# Patient Record
Sex: Female | Born: 1940
Health system: Southern US, Community
[De-identification: ages and names within clinical notes are randomized; demographics above are authoritative.]

## PROBLEM LIST (undated history)

## (undated) DIAGNOSIS — R339 Retention of urine, unspecified: Secondary | ICD-10-CM

## (undated) DIAGNOSIS — Z9889 Other specified postprocedural states: Secondary | ICD-10-CM

## (undated) DIAGNOSIS — E785 Hyperlipidemia, unspecified: Secondary | ICD-10-CM

## (undated) DIAGNOSIS — M069 Rheumatoid arthritis, unspecified: Secondary | ICD-10-CM

## (undated) DIAGNOSIS — S62101A Fracture of unspecified carpal bone, right wrist, initial encounter for closed fracture: Secondary | ICD-10-CM

## (undated) DIAGNOSIS — G709 Myoneural disorder, unspecified: Secondary | ICD-10-CM

## (undated) DIAGNOSIS — Z860100 Personal history of colon polyps, unspecified: Secondary | ICD-10-CM

## (undated) DIAGNOSIS — R238 Other skin changes: Secondary | ICD-10-CM

## (undated) DIAGNOSIS — M549 Dorsalgia, unspecified: Secondary | ICD-10-CM

## (undated) DIAGNOSIS — G35D Multiple sclerosis, unspecified: Secondary | ICD-10-CM

## (undated) DIAGNOSIS — I10 Essential (primary) hypertension: Secondary | ICD-10-CM

## (undated) DIAGNOSIS — R233 Spontaneous ecchymoses: Secondary | ICD-10-CM

## (undated) DIAGNOSIS — G35 Multiple sclerosis: Secondary | ICD-10-CM

## (undated) DIAGNOSIS — K219 Gastro-esophageal reflux disease without esophagitis: Secondary | ICD-10-CM

## (undated) DIAGNOSIS — M2550 Pain in unspecified joint: Secondary | ICD-10-CM

## (undated) DIAGNOSIS — M2540 Effusion, unspecified joint: Secondary | ICD-10-CM

## (undated) DIAGNOSIS — M1990 Unspecified osteoarthritis, unspecified site: Secondary | ICD-10-CM

## (undated) DIAGNOSIS — Z8709 Personal history of other diseases of the respiratory system: Secondary | ICD-10-CM

## (undated) DIAGNOSIS — G8929 Other chronic pain: Secondary | ICD-10-CM

## (undated) DIAGNOSIS — R42 Dizziness and giddiness: Secondary | ICD-10-CM

## (undated) DIAGNOSIS — R51 Headache: Secondary | ICD-10-CM

## (undated) DIAGNOSIS — C801 Malignant (primary) neoplasm, unspecified: Secondary | ICD-10-CM

## (undated) DIAGNOSIS — R351 Nocturia: Secondary | ICD-10-CM

## (undated) DIAGNOSIS — C4490 Unspecified malignant neoplasm of skin, unspecified: Secondary | ICD-10-CM

## (undated) DIAGNOSIS — H269 Unspecified cataract: Secondary | ICD-10-CM

## (undated) DIAGNOSIS — H9190 Unspecified hearing loss, unspecified ear: Secondary | ICD-10-CM

## (undated) DIAGNOSIS — F419 Anxiety disorder, unspecified: Secondary | ICD-10-CM

## (undated) DIAGNOSIS — R112 Nausea with vomiting, unspecified: Secondary | ICD-10-CM

## (undated) HISTORY — PX: CHEST TUBE INSERTION: SHX231

## (undated) HISTORY — DX: Unspecified malignant neoplasm of skin, unspecified: C44.90

## (undated) HISTORY — PX: EYE SURGERY: SHX253

## (undated) HISTORY — PX: TOTAL SHOULDER ARTHROPLASTY: SHX126

## (undated) HISTORY — PX: THYROID SURGERY: SHX805

## (undated) HISTORY — PX: OTHER SURGICAL HISTORY: SHX169

## (undated) HISTORY — DX: Rheumatoid arthritis, unspecified: M06.9

## (undated) HISTORY — PX: BREAST BIOPSY: SHX20

## (undated) HISTORY — DX: Essential (primary) hypertension: I10

## (undated) HISTORY — PX: LUMBAR FUSION: SHX111

## (undated) HISTORY — PX: TUBAL LIGATION: SHX77

## (undated) HISTORY — PX: ABDOMINAL HYSTERECTOMY: SHX81

## (undated) HISTORY — DX: Fracture of unspecified carpal bone, right wrist, initial encounter for closed fracture: S62.101A

## (undated) HISTORY — PX: JOINT REPLACEMENT: SHX530

## (undated) HISTORY — PX: SKIN BIOPSY: SHX1

## (undated) HISTORY — DX: Unspecified osteoarthritis, unspecified site: M19.90

## (undated) HISTORY — PX: NASAL SINUS SURGERY: SHX719

## (undated) HISTORY — PX: APPENDECTOMY: SHX54

## (undated) HISTORY — PX: CERVICAL FUSION: SHX112

## (undated) HISTORY — PX: BACK SURGERY: SHX140

## (undated) HISTORY — PX: CARPAL TUNNEL RELEASE: SHX101

---

## 1988-01-21 DIAGNOSIS — C4491 Basal cell carcinoma of skin, unspecified: Secondary | ICD-10-CM

## 1988-01-21 HISTORY — DX: Basal cell carcinoma of skin, unspecified: C44.91

## 1995-05-27 HISTORY — PX: COLONOSCOPY W/ POLYPECTOMY: SHX1380

## 1997-09-10 ENCOUNTER — Ambulatory Visit (HOSPITAL_COMMUNITY): Admission: RE | Admit: 1997-09-10 | Discharge: 1997-09-10 | Payer: Self-pay | Admitting: Neurosurgery

## 1997-09-18 ENCOUNTER — Encounter: Admission: RE | Admit: 1997-09-18 | Discharge: 1997-12-17 | Payer: Self-pay | Admitting: Neurosurgery

## 1998-03-24 ENCOUNTER — Encounter: Payer: Self-pay | Admitting: *Deleted

## 1998-03-24 ENCOUNTER — Emergency Department (HOSPITAL_COMMUNITY): Admission: EM | Admit: 1998-03-24 | Discharge: 1998-03-24 | Payer: Self-pay | Admitting: *Deleted

## 1998-03-25 ENCOUNTER — Emergency Department (HOSPITAL_COMMUNITY): Admission: EM | Admit: 1998-03-25 | Discharge: 1998-03-25 | Payer: Self-pay | Admitting: Emergency Medicine

## 1998-03-27 ENCOUNTER — Encounter: Payer: Self-pay | Admitting: Internal Medicine

## 1998-03-27 ENCOUNTER — Inpatient Hospital Stay (HOSPITAL_COMMUNITY): Admission: EM | Admit: 1998-03-27 | Discharge: 1998-04-02 | Payer: Self-pay | Admitting: Internal Medicine

## 1998-03-28 ENCOUNTER — Encounter: Payer: Self-pay | Admitting: Cardiothoracic Surgery

## 1998-03-29 ENCOUNTER — Encounter: Payer: Self-pay | Admitting: Cardiothoracic Surgery

## 1998-03-30 ENCOUNTER — Encounter: Payer: Self-pay | Admitting: Cardiothoracic Surgery

## 1998-04-01 ENCOUNTER — Encounter: Payer: Self-pay | Admitting: Thoracic Surgery

## 1998-04-01 ENCOUNTER — Encounter: Payer: Self-pay | Admitting: Cardiothoracic Surgery

## 1998-04-02 ENCOUNTER — Encounter: Payer: Self-pay | Admitting: Thoracic Surgery

## 1998-05-03 ENCOUNTER — Encounter: Payer: Self-pay | Admitting: Internal Medicine

## 1998-05-03 ENCOUNTER — Ambulatory Visit (HOSPITAL_COMMUNITY): Admission: RE | Admit: 1998-05-03 | Discharge: 1998-05-03 | Payer: Self-pay | Admitting: Internal Medicine

## 1999-05-24 ENCOUNTER — Encounter: Payer: Self-pay | Admitting: Internal Medicine

## 1999-05-24 ENCOUNTER — Ambulatory Visit (HOSPITAL_COMMUNITY): Admission: RE | Admit: 1999-05-24 | Discharge: 1999-05-24 | Payer: Self-pay | Admitting: Internal Medicine

## 1999-06-11 ENCOUNTER — Encounter: Admission: RE | Admit: 1999-06-11 | Discharge: 1999-06-11 | Payer: Self-pay | Admitting: Obstetrics and Gynecology

## 1999-06-11 ENCOUNTER — Encounter: Payer: Self-pay | Admitting: Obstetrics and Gynecology

## 1999-07-22 ENCOUNTER — Encounter: Admission: RE | Admit: 1999-07-22 | Discharge: 1999-07-22 | Payer: Self-pay | Admitting: Internal Medicine

## 1999-07-22 ENCOUNTER — Encounter: Payer: Self-pay | Admitting: Internal Medicine

## 2000-06-22 ENCOUNTER — Encounter: Payer: Self-pay | Admitting: Obstetrics and Gynecology

## 2000-06-22 ENCOUNTER — Encounter: Admission: RE | Admit: 2000-06-22 | Discharge: 2000-06-22 | Payer: Self-pay | Admitting: Obstetrics and Gynecology

## 2000-12-04 ENCOUNTER — Other Ambulatory Visit: Admission: RE | Admit: 2000-12-04 | Discharge: 2000-12-04 | Payer: Self-pay | Admitting: Obstetrics and Gynecology

## 2001-11-02 DIAGNOSIS — C4492 Squamous cell carcinoma of skin, unspecified: Secondary | ICD-10-CM

## 2001-11-02 HISTORY — DX: Squamous cell carcinoma of skin, unspecified: C44.92

## 2001-12-17 ENCOUNTER — Other Ambulatory Visit: Admission: RE | Admit: 2001-12-17 | Discharge: 2001-12-17 | Payer: Self-pay | Admitting: Obstetrics and Gynecology

## 2002-04-01 ENCOUNTER — Encounter: Payer: Self-pay | Admitting: Neurosurgery

## 2002-04-05 ENCOUNTER — Encounter: Payer: Self-pay | Admitting: Neurosurgery

## 2002-04-05 ENCOUNTER — Inpatient Hospital Stay (HOSPITAL_COMMUNITY): Admission: RE | Admit: 2002-04-05 | Discharge: 2002-04-10 | Payer: Self-pay | Admitting: Neurosurgery

## 2002-04-08 ENCOUNTER — Encounter: Payer: Self-pay | Admitting: Neurosurgery

## 2002-04-10 ENCOUNTER — Emergency Department (HOSPITAL_COMMUNITY): Admission: EM | Admit: 2002-04-10 | Discharge: 2002-04-10 | Payer: Self-pay | Admitting: Emergency Medicine

## 2002-04-13 ENCOUNTER — Ambulatory Visit (HOSPITAL_COMMUNITY): Admission: RE | Admit: 2002-04-13 | Discharge: 2002-04-13 | Payer: Self-pay | Admitting: Neurosurgery

## 2002-04-13 ENCOUNTER — Encounter: Payer: Self-pay | Admitting: Neurosurgery

## 2002-12-28 ENCOUNTER — Encounter: Admission: RE | Admit: 2002-12-28 | Discharge: 2002-12-28 | Payer: Self-pay | Admitting: Internal Medicine

## 2002-12-28 ENCOUNTER — Encounter: Payer: Self-pay | Admitting: Internal Medicine

## 2003-01-05 ENCOUNTER — Other Ambulatory Visit: Admission: RE | Admit: 2003-01-05 | Discharge: 2003-01-05 | Payer: Self-pay | Admitting: Obstetrics and Gynecology

## 2003-01-10 ENCOUNTER — Ambulatory Visit (HOSPITAL_COMMUNITY): Admission: RE | Admit: 2003-01-10 | Discharge: 2003-01-10 | Payer: Self-pay | Admitting: Neurosurgery

## 2003-02-10 ENCOUNTER — Encounter (INDEPENDENT_AMBULATORY_CARE_PROVIDER_SITE_OTHER): Payer: Self-pay | Admitting: *Deleted

## 2003-02-10 ENCOUNTER — Ambulatory Visit (HOSPITAL_COMMUNITY): Admission: RE | Admit: 2003-02-10 | Discharge: 2003-02-10 | Payer: Self-pay | Admitting: Internal Medicine

## 2003-02-13 ENCOUNTER — Encounter (INDEPENDENT_AMBULATORY_CARE_PROVIDER_SITE_OTHER): Payer: Self-pay | Admitting: Internal Medicine

## 2003-02-13 ENCOUNTER — Ambulatory Visit (HOSPITAL_COMMUNITY): Admission: RE | Admit: 2003-02-13 | Discharge: 2003-02-13 | Payer: Self-pay | Admitting: Internal Medicine

## 2003-06-06 ENCOUNTER — Inpatient Hospital Stay (HOSPITAL_COMMUNITY): Admission: EM | Admit: 2003-06-06 | Discharge: 2003-06-08 | Payer: Self-pay | Admitting: Emergency Medicine

## 2004-02-19 ENCOUNTER — Other Ambulatory Visit: Admission: RE | Admit: 2004-02-19 | Discharge: 2004-02-19 | Payer: Self-pay | Admitting: Obstetrics and Gynecology

## 2004-04-05 ENCOUNTER — Ambulatory Visit: Payer: Self-pay | Admitting: Internal Medicine

## 2004-06-11 ENCOUNTER — Ambulatory Visit: Payer: Self-pay | Admitting: Internal Medicine

## 2004-06-26 ENCOUNTER — Ambulatory Visit: Payer: Self-pay | Admitting: Internal Medicine

## 2004-07-18 ENCOUNTER — Ambulatory Visit: Payer: Self-pay | Admitting: Internal Medicine

## 2004-08-23 ENCOUNTER — Ambulatory Visit: Payer: Self-pay | Admitting: Internal Medicine

## 2004-12-31 ENCOUNTER — Ambulatory Visit: Payer: Self-pay | Admitting: Internal Medicine

## 2005-01-02 ENCOUNTER — Ambulatory Visit (HOSPITAL_COMMUNITY): Admission: RE | Admit: 2005-01-02 | Discharge: 2005-01-02 | Payer: Self-pay | Admitting: Orthopedic Surgery

## 2005-03-06 ENCOUNTER — Ambulatory Visit: Payer: Self-pay | Admitting: Internal Medicine

## 2005-04-25 ENCOUNTER — Ambulatory Visit: Payer: Self-pay | Admitting: Internal Medicine

## 2005-06-10 ENCOUNTER — Ambulatory Visit: Payer: Self-pay | Admitting: Internal Medicine

## 2005-06-30 ENCOUNTER — Other Ambulatory Visit: Admission: RE | Admit: 2005-06-30 | Discharge: 2005-06-30 | Payer: Self-pay | Admitting: Obstetrics and Gynecology

## 2005-07-07 ENCOUNTER — Ambulatory Visit (HOSPITAL_COMMUNITY): Admission: RE | Admit: 2005-07-07 | Discharge: 2005-07-07 | Payer: Self-pay | Admitting: Neurosurgery

## 2005-07-28 ENCOUNTER — Ambulatory Visit: Payer: Self-pay | Admitting: Internal Medicine

## 2005-07-30 ENCOUNTER — Ambulatory Visit: Payer: Self-pay | Admitting: Internal Medicine

## 2005-08-05 ENCOUNTER — Ambulatory Visit: Payer: Self-pay | Admitting: Internal Medicine

## 2005-08-08 ENCOUNTER — Ambulatory Visit: Payer: Self-pay | Admitting: Internal Medicine

## 2005-09-11 ENCOUNTER — Encounter (INDEPENDENT_AMBULATORY_CARE_PROVIDER_SITE_OTHER): Payer: Self-pay | Admitting: *Deleted

## 2005-09-11 ENCOUNTER — Ambulatory Visit: Payer: Self-pay | Admitting: Internal Medicine

## 2005-09-11 ENCOUNTER — Ambulatory Visit (HOSPITAL_COMMUNITY): Admission: RE | Admit: 2005-09-11 | Discharge: 2005-09-11 | Payer: Self-pay | Admitting: Internal Medicine

## 2005-09-24 ENCOUNTER — Encounter: Payer: Self-pay | Admitting: Cardiothoracic Surgery

## 2005-09-24 ENCOUNTER — Inpatient Hospital Stay (HOSPITAL_COMMUNITY): Admission: RE | Admit: 2005-09-24 | Discharge: 2005-09-26 | Payer: Self-pay | Admitting: Neurosurgery

## 2006-01-23 ENCOUNTER — Ambulatory Visit: Payer: Self-pay | Admitting: Internal Medicine

## 2006-02-25 ENCOUNTER — Encounter: Admission: RE | Admit: 2006-02-25 | Discharge: 2006-02-25 | Payer: Self-pay | Admitting: Obstetrics and Gynecology

## 2006-04-23 ENCOUNTER — Ambulatory Visit: Payer: Self-pay | Admitting: Internal Medicine

## 2006-08-20 DIAGNOSIS — Z8541 Personal history of malignant neoplasm of cervix uteri: Secondary | ICD-10-CM | POA: Insufficient documentation

## 2006-08-20 DIAGNOSIS — M069 Rheumatoid arthritis, unspecified: Secondary | ICD-10-CM | POA: Insufficient documentation

## 2006-08-20 DIAGNOSIS — F411 Generalized anxiety disorder: Secondary | ICD-10-CM | POA: Insufficient documentation

## 2006-08-20 DIAGNOSIS — I10 Essential (primary) hypertension: Secondary | ICD-10-CM | POA: Insufficient documentation

## 2006-08-20 DIAGNOSIS — Z8711 Personal history of peptic ulcer disease: Secondary | ICD-10-CM

## 2006-08-20 DIAGNOSIS — E785 Hyperlipidemia, unspecified: Secondary | ICD-10-CM | POA: Insufficient documentation

## 2006-08-20 DIAGNOSIS — M1990 Unspecified osteoarthritis, unspecified site: Secondary | ICD-10-CM | POA: Insufficient documentation

## 2006-08-20 DIAGNOSIS — M109 Gout, unspecified: Secondary | ICD-10-CM

## 2006-09-16 ENCOUNTER — Ambulatory Visit: Payer: Self-pay | Admitting: Internal Medicine

## 2006-09-22 ENCOUNTER — Ambulatory Visit: Payer: Self-pay | Admitting: Internal Medicine

## 2006-11-02 ENCOUNTER — Encounter: Payer: Self-pay | Admitting: Internal Medicine

## 2006-11-03 ENCOUNTER — Ambulatory Visit: Payer: Self-pay | Admitting: Internal Medicine

## 2006-11-16 ENCOUNTER — Encounter: Payer: Self-pay | Admitting: Internal Medicine

## 2007-02-05 ENCOUNTER — Ambulatory Visit (HOSPITAL_COMMUNITY)
Admission: RE | Admit: 2007-02-05 | Discharge: 2007-02-05 | Payer: Self-pay | Admitting: Physical Medicine and Rehabilitation

## 2007-02-05 ENCOUNTER — Ambulatory Visit: Payer: Self-pay | Admitting: Vascular Surgery

## 2007-02-05 ENCOUNTER — Encounter (INDEPENDENT_AMBULATORY_CARE_PROVIDER_SITE_OTHER): Payer: Self-pay | Admitting: Physical Medicine and Rehabilitation

## 2007-02-05 ENCOUNTER — Encounter: Payer: Self-pay | Admitting: Internal Medicine

## 2007-02-08 ENCOUNTER — Encounter
Admission: RE | Admit: 2007-02-08 | Discharge: 2007-02-08 | Payer: Self-pay | Admitting: Physical Medicine and Rehabilitation

## 2007-02-18 ENCOUNTER — Encounter: Admission: RE | Admit: 2007-02-18 | Discharge: 2007-02-18 | Payer: Self-pay | Admitting: Rheumatology

## 2007-02-19 ENCOUNTER — Telehealth (INDEPENDENT_AMBULATORY_CARE_PROVIDER_SITE_OTHER): Payer: Self-pay | Admitting: *Deleted

## 2007-02-22 ENCOUNTER — Ambulatory Visit: Payer: Self-pay | Admitting: Internal Medicine

## 2007-03-04 ENCOUNTER — Telehealth (INDEPENDENT_AMBULATORY_CARE_PROVIDER_SITE_OTHER): Payer: Self-pay | Admitting: *Deleted

## 2007-03-10 ENCOUNTER — Encounter: Payer: Self-pay | Admitting: Internal Medicine

## 2007-03-30 ENCOUNTER — Ambulatory Visit: Payer: Self-pay | Admitting: Internal Medicine

## 2007-03-30 DIAGNOSIS — R5382 Chronic fatigue, unspecified: Secondary | ICD-10-CM

## 2007-05-10 ENCOUNTER — Telehealth (INDEPENDENT_AMBULATORY_CARE_PROVIDER_SITE_OTHER): Payer: Self-pay | Admitting: *Deleted

## 2007-05-10 ENCOUNTER — Encounter: Payer: Self-pay | Admitting: Internal Medicine

## 2007-05-13 ENCOUNTER — Emergency Department (HOSPITAL_COMMUNITY): Admission: EM | Admit: 2007-05-13 | Discharge: 2007-05-13 | Payer: Self-pay | Admitting: Emergency Medicine

## 2007-05-13 ENCOUNTER — Telehealth: Payer: Self-pay | Admitting: Internal Medicine

## 2007-07-01 ENCOUNTER — Encounter: Payer: Self-pay | Admitting: Internal Medicine

## 2007-08-24 ENCOUNTER — Encounter: Payer: Self-pay | Admitting: Internal Medicine

## 2007-11-09 ENCOUNTER — Telehealth (INDEPENDENT_AMBULATORY_CARE_PROVIDER_SITE_OTHER): Payer: Self-pay | Admitting: *Deleted

## 2007-11-16 ENCOUNTER — Ambulatory Visit: Payer: Self-pay | Admitting: Internal Medicine

## 2007-11-29 ENCOUNTER — Encounter (INDEPENDENT_AMBULATORY_CARE_PROVIDER_SITE_OTHER): Payer: Self-pay | Admitting: *Deleted

## 2007-11-29 LAB — CONVERTED CEMR LAB
ALT: 19 units/L (ref 0–35)
Albumin: 3.7 g/dL (ref 3.5–5.2)
BUN: 12 mg/dL (ref 6–23)
Basophils Absolute: 0 10*3/uL (ref 0.0–0.1)
Basophils Relative: 0.8 % (ref 0.0–1.0)
Calcium: 9.3 mg/dL (ref 8.4–10.5)
Cholesterol: 176 mg/dL (ref 0–200)
Creatinine, Ser: 0.9 mg/dL (ref 0.4–1.2)
Eosinophils Absolute: 0.1 10*3/uL (ref 0.0–0.7)
Eosinophils Relative: 1.1 % (ref 0.0–5.0)
GFR calc non Af Amer: 67 mL/min
HCT: 39 % (ref 36.0–46.0)
Hemoglobin: 13.4 g/dL (ref 12.0–15.0)
MCHC: 34.3 g/dL (ref 30.0–36.0)
MCV: 97.8 fL (ref 78.0–100.0)
Monocytes Absolute: 0.6 10*3/uL (ref 0.1–1.0)
Neutro Abs: 3.4 10*3/uL (ref 1.4–7.7)
RBC: 3.98 M/uL (ref 3.87–5.11)
TSH: 1.73 microintl units/mL (ref 0.35–5.50)
Total Bilirubin: 0.5 mg/dL (ref 0.3–1.2)
VLDL: 20 mg/dL (ref 0–40)

## 2007-12-22 ENCOUNTER — Encounter: Payer: Self-pay | Admitting: Internal Medicine

## 2008-01-03 ENCOUNTER — Telehealth (INDEPENDENT_AMBULATORY_CARE_PROVIDER_SITE_OTHER): Payer: Self-pay | Admitting: *Deleted

## 2008-01-18 ENCOUNTER — Encounter: Payer: Self-pay | Admitting: Internal Medicine

## 2008-04-06 ENCOUNTER — Encounter: Payer: Self-pay | Admitting: Internal Medicine

## 2008-05-01 ENCOUNTER — Encounter: Payer: Self-pay | Admitting: Internal Medicine

## 2008-05-02 ENCOUNTER — Encounter (INDEPENDENT_AMBULATORY_CARE_PROVIDER_SITE_OTHER): Payer: Self-pay | Admitting: *Deleted

## 2008-05-17 ENCOUNTER — Ambulatory Visit: Payer: Self-pay | Admitting: Internal Medicine

## 2008-05-17 DIAGNOSIS — R7309 Other abnormal glucose: Secondary | ICD-10-CM

## 2008-05-17 DIAGNOSIS — E041 Nontoxic single thyroid nodule: Secondary | ICD-10-CM | POA: Insufficient documentation

## 2008-05-17 DIAGNOSIS — G35 Multiple sclerosis: Secondary | ICD-10-CM | POA: Insufficient documentation

## 2008-05-22 ENCOUNTER — Encounter (INDEPENDENT_AMBULATORY_CARE_PROVIDER_SITE_OTHER): Payer: Self-pay | Admitting: *Deleted

## 2008-11-14 ENCOUNTER — Ambulatory Visit: Payer: Self-pay | Admitting: Internal Medicine

## 2008-11-14 DIAGNOSIS — I951 Orthostatic hypotension: Secondary | ICD-10-CM | POA: Insufficient documentation

## 2008-11-14 LAB — CONVERTED CEMR LAB
Cholesterol, target level: 200 mg/dL
HDL goal, serum: 40 mg/dL
LDL Goal: 160 mg/dL

## 2009-01-22 ENCOUNTER — Ambulatory Visit: Payer: Self-pay | Admitting: Internal Medicine

## 2009-01-23 ENCOUNTER — Ambulatory Visit: Payer: Self-pay | Admitting: Internal Medicine

## 2009-01-25 ENCOUNTER — Telehealth (INDEPENDENT_AMBULATORY_CARE_PROVIDER_SITE_OTHER): Payer: Self-pay | Admitting: *Deleted

## 2009-01-25 ENCOUNTER — Encounter (INDEPENDENT_AMBULATORY_CARE_PROVIDER_SITE_OTHER): Payer: Self-pay | Admitting: *Deleted

## 2009-03-01 ENCOUNTER — Encounter: Payer: Self-pay | Admitting: Internal Medicine

## 2009-05-21 ENCOUNTER — Telehealth (INDEPENDENT_AMBULATORY_CARE_PROVIDER_SITE_OTHER): Payer: Self-pay | Admitting: *Deleted

## 2009-07-26 ENCOUNTER — Encounter: Payer: Self-pay | Admitting: Internal Medicine

## 2009-08-20 ENCOUNTER — Telehealth: Payer: Self-pay | Admitting: Internal Medicine

## 2009-10-11 ENCOUNTER — Telehealth (INDEPENDENT_AMBULATORY_CARE_PROVIDER_SITE_OTHER): Payer: Self-pay | Admitting: *Deleted

## 2009-11-12 ENCOUNTER — Telehealth (INDEPENDENT_AMBULATORY_CARE_PROVIDER_SITE_OTHER): Payer: Self-pay | Admitting: *Deleted

## 2009-11-29 ENCOUNTER — Telehealth: Payer: Self-pay | Admitting: Internal Medicine

## 2009-12-11 ENCOUNTER — Encounter: Payer: Self-pay | Admitting: Internal Medicine

## 2010-02-22 ENCOUNTER — Encounter: Payer: Self-pay | Admitting: Internal Medicine

## 2010-04-15 ENCOUNTER — Encounter: Payer: Self-pay | Admitting: Internal Medicine

## 2010-04-24 ENCOUNTER — Encounter: Payer: Self-pay | Admitting: Internal Medicine

## 2010-05-06 ENCOUNTER — Encounter: Payer: Self-pay | Admitting: Internal Medicine

## 2010-05-17 ENCOUNTER — Emergency Department (HOSPITAL_BASED_OUTPATIENT_CLINIC_OR_DEPARTMENT_OTHER)
Admission: EM | Admit: 2010-05-17 | Discharge: 2010-05-17 | Payer: Self-pay | Source: Home / Self Care | Admitting: Emergency Medicine

## 2010-05-26 HISTORY — PX: UPPER GASTROINTESTINAL ENDOSCOPY: SHX188

## 2010-05-28 ENCOUNTER — Encounter (INDEPENDENT_AMBULATORY_CARE_PROVIDER_SITE_OTHER): Payer: Self-pay | Admitting: *Deleted

## 2010-05-28 ENCOUNTER — Encounter: Payer: Self-pay | Admitting: Internal Medicine

## 2010-05-28 ENCOUNTER — Ambulatory Visit
Admission: RE | Admit: 2010-05-28 | Discharge: 2010-05-28 | Payer: Self-pay | Source: Home / Self Care | Attending: Internal Medicine | Admitting: Internal Medicine

## 2010-05-28 DIAGNOSIS — R351 Nocturia: Secondary | ICD-10-CM | POA: Insufficient documentation

## 2010-05-29 ENCOUNTER — Other Ambulatory Visit: Payer: Self-pay | Admitting: Internal Medicine

## 2010-05-29 ENCOUNTER — Ambulatory Visit
Admission: RE | Admit: 2010-05-29 | Discharge: 2010-05-29 | Payer: Self-pay | Source: Home / Self Care | Attending: Internal Medicine | Admitting: Internal Medicine

## 2010-05-29 ENCOUNTER — Encounter: Payer: Self-pay | Admitting: Internal Medicine

## 2010-05-29 LAB — CONVERTED CEMR LAB
Nitrite: POSITIVE
Protein, U semiquant: NEGATIVE
Urobilinogen, UA: 0.2

## 2010-05-29 LAB — LIPID PANEL
Cholesterol: 201 mg/dL — ABNORMAL HIGH (ref 0–200)
HDL: 50.8 mg/dL (ref >39.00)
Total CHOL/HDL Ratio: 4
Triglycerides: 110 mg/dL (ref 0.0–149.0)
VLDL: 22 mg/dL (ref 0.0–40.0)

## 2010-05-29 LAB — LDL CHOLESTEROL, DIRECT: Direct LDL: 132 mg/dL

## 2010-05-29 LAB — T4, FREE: Free T4: 0.87 ng/dL (ref 0.60–1.60)

## 2010-05-29 LAB — BASIC METABOLIC PANEL
BUN: 11 mg/dL (ref 6–23)
CO2: 30 mEq/L (ref 19–32)
Calcium: 9.2 mg/dL (ref 8.4–10.5)
Chloride: 100 mEq/L (ref 96–112)
Creatinine, Ser: 0.7 mg/dL (ref 0.4–1.2)
GFR: 89.86 mL/min (ref >60.00)
Glucose, Bld: 76 mg/dL (ref 70–99)
Potassium: 4.8 mEq/L (ref 3.5–5.1)
Sodium: 138 mEq/L (ref 135–145)

## 2010-05-29 LAB — TSH: TSH: 2.61 u[IU]/mL (ref 0.35–5.50)

## 2010-05-29 LAB — T3, FREE: T3, Free: 2.2 pg/mL — ABNORMAL LOW (ref 2.3–4.2)

## 2010-06-14 ENCOUNTER — Telehealth (INDEPENDENT_AMBULATORY_CARE_PROVIDER_SITE_OTHER): Payer: Self-pay | Admitting: *Deleted

## 2010-06-14 ENCOUNTER — Telehealth: Payer: Self-pay | Admitting: Internal Medicine

## 2010-06-15 ENCOUNTER — Other Ambulatory Visit: Payer: Self-pay | Admitting: Internal Medicine

## 2010-06-15 DIAGNOSIS — E041 Nontoxic single thyroid nodule: Secondary | ICD-10-CM

## 2010-06-23 LAB — CONVERTED CEMR LAB
ALT: 18 units/L (ref 0–40)
BUN: 6 mg/dL (ref 6–23)
Bilirubin, Direct: 0.1 mg/dL (ref 0.0–0.3)
Blood in Urine, dipstick: NEGATIVE
Calcium: 9.9 mg/dL (ref 8.4–10.5)
Cholesterol: 225 mg/dL (ref 0–200)
Creatinine, Ser: 0.8 mg/dL (ref 0.4–1.2)
Direct LDL: 111.4 mg/dL
Eosinophils Absolute: 0 10*3/uL (ref 0.0–0.6)
Eosinophils Relative: 0.2 % (ref 0.0–5.0)
Free T4: 0.7 ng/dL (ref 0.6–1.6)
Free T4: 0.7 ng/dL (ref 0.6–1.6)
GFR calc Af Amer: 108 mL/min
GFR calc non Af Amer: 90 mL/min
Glucose, Bld: 105 mg/dL — ABNORMAL HIGH (ref 70–99)
Glucose, Urine, Semiquant: NEGATIVE
HDL: 75.2 mg/dL (ref >39.0)
HDL: 83.9 mg/dL (ref >39.0)
Hgb A1c MFr Bld: 5.6 % (ref 4.6–6.0)
Ketones, urine, test strip: NEGATIVE
Lymphocytes Relative: 6.9 % — ABNORMAL LOW (ref 12.0–46.0)
MCV: 96.7 fL (ref 78.0–100.0)
Monocytes Relative: 4.7 % (ref 3.0–11.0)
Neutro Abs: 9.4 10*3/uL — ABNORMAL HIGH (ref 1.4–7.7)
Nitrite: NEGATIVE
Platelets: 359 10*3/uL (ref 150–400)
Potassium: 4 meq/L (ref 3.5–5.1)
Specific Gravity, Urine: 1.01
TSH: 0.84 microintl units/mL (ref 0.35–5.50)
Total CHOL/HDL Ratio: 2.5
WBC: 10.6 10*3/uL — ABNORMAL HIGH (ref 4.5–10.5)
pH: 5

## 2010-06-25 NOTE — Letter (Signed)
Summary: [facility]  [facility]   Imported By: Lanelle Bal 03/06/2010 14:19:56  _____________________________________________________________________  External Attachment:    Type:   Image     Comment:   External Document

## 2010-06-25 NOTE — Progress Notes (Signed)
Summary: Request for Med Change  Phone Note Call from Patient Call back at [phone] (Cell)   Caller: Patient Summary of Call: Message left on VM: Patient would like another med for acid reflux, patient currently taking Ranitidine but her husband takes generic prilosec and she tried it and it helps more. [phone]  Dr.Godson Pollan please advise on med change   [facility]  November 29, 2009 2:23 PM   Follow-up for Phone Call        Omeprazole 20 mg once daily as needed #90, RX 1 Follow-up by: Marga Melnick MD,  November 30, 2009 4:12 AM  Additional Follow-up for Phone Call Additional follow up Details #1::        [facility] Additional Follow-up by: Barnie Mort,  December 03, 2009 4:30 PM     Appended Document: Request for Med Change Patient notified via cell voicemail

## 2010-06-25 NOTE — Progress Notes (Signed)
Summary: Request for med change  Phone Note Call from Patient Call back at [phone]   Caller: Patient Summary of Call: Message left on VM: Patient would like Fosamax changed to a new med that you dont have to do all this preparation for( waiting 30 min ect . .) Patient didnt mention the name of med (? Valla Leaver)  Dr.Hopper please advise    Sherri [name]  August 20, 2009 4:56 PM   Follow-up for Phone Call        all bisphosphonates have this precaution. Sherri [name] is given only 1X / month but is usually much more expensive. She can check her plan &7 let me know Follow-up by: Marga Melnick MD,  August 20, 2009 6:21 PM  Additional Follow-up for Phone Call Additional follow up Details #1::        left message to call office...........Marland KitchenFelecia Deloach CMA  August 21, 2009 8:54 AM  left message to call office..............Marland KitchenFelecia Deloach CMA  August 21, 2009 10:10 AM     Additional Follow-up for Phone Call Additional follow up Details #2::    pt would like for you to rx new med called atelvia 35mg . pls advise on  med..........Marland KitchenFelecia Deloach CMA  August 21, 2009 10:43 AM   pt aware of precaution..............Marland KitchenFelecia Deloach CMA  August 21, 2009 10:43 AM   Additional Follow-up for Phone Call Additional follow up Details #3:: Details for Additional Follow-up Action Taken: Atelvia 35 mg #4 , once weekly.Note : the insert still recommends  immediately  after b'fst & do not lie down for 30 min. Insurance may not cover  pt aware rx sent to pharmacy.............Marland KitchenFelecia Deloach CMA  August 21, 2009 4:21 PM  Additional Follow-up by: Marga Melnick MD,  August 21, 2009 1:26 PM  New/Updated Medications: ATELVIA 35 MG TBEC (RISEDRONATE SODIUM) Take 1 tab once weekly*Note: insert still recommends immediately after b'fst & do not lie down for 30 min* Prescriptions: ATELVIA 35 MG TBEC (RISEDRONATE SODIUM) Take 1 tab once weekly*Note: insert still recommends immediately after b'fst & do not lie down for  30 min*  #4 x 0   Entered by:   Jeremy Johann CMA   Authorized by:   Marga Melnick MD   Signed by:   Jeremy Johann CMA on 08/21/2009   Method used:   [facility]ed to ...       [facility] [phone]* (retail)       [address]       [place], [place]  [address]       Ph: [phone]       Fax: [phone]   RxID:   [id number]

## 2010-06-25 NOTE — Progress Notes (Signed)
Summary: rx for nail fungus  Phone Note Call from Patient Call back at [phone]   Caller: Patient Summary of Call: would like rx for fungus under nail was working with some plants. Pharmacist recommended a med called Penlac, but would need a Rx. [facility] [phone]  Initial call taken by: Kandice Hams,  Oct 11, 2009 4:33 PM  Follow-up for Phone Call        Penlac OK as per package insert Follow-up by: Marga Melnick MD,  Oct 11, 2009 6:26 PM  Additional Follow-up for Phone Call Additional follow up Details #1::        RX CALLED, PT INFORMED .Kandice Hams  Oct 12, 2009 8:29 AM  Additional Follow-up by: Kandice Hams,  Oct 12, 2009 8:29 AM    New/Updated Medications: PENLAC 8 % SOLN (CICLOPIROX) USE AS DIRECTED Prescriptions: PENLAC 8 % SOLN (CICLOPIROX) USE AS DIRECTED  #1 x 0   Entered by:   Kandice Hams   Authorized by:   Marga Melnick MD   Signed by:   Kandice Hams on 10/12/2009   Method used:   Telephoned to ...       [facility] [phone]* (retail)       [address]       [place], [place]  [address]       Ph: [phone]       Fax: [phone]   RxID:   [id number]

## 2010-06-25 NOTE — Letter (Signed)
Summary: [facility]  [facility]   Imported By: Lanelle Bal 08/13/2009 11:43:56  _____________________________________________________________________  External Attachment:    Type:   Image     Comment:   External Document

## 2010-06-25 NOTE — Letter (Signed)
Summary: [facility]  [facility]   Imported By: Lanelle Bal 12/20/2009 12:43:25  _____________________________________________________________________  External Attachment:    Type:   Image     Comment:   External Document

## 2010-06-25 NOTE — Progress Notes (Signed)
Summary: Medication Concerns   Phone Note Outgoing Call Call back at [facility] Phone [phone]   Call placed by: Shonna Chock,  November 12, 2009 9:29 AM Call placed to: Patient Summary of Call: Left message on machine for patient to return call when avaliable, Reason for call:   We recieved a refill for Generic Fosamax and we no longer have that med on patient's med list, patient requested to change med, patient needs to call to discuss./[facility]  November 12, 2009 9:30 AM   Follow-up for Phone Call        Patient called back to say the med that she requested to change Fosamax to was very expensive and she needs to switch back to Fosamax Follow-up by: Shonna Chock,  November 12, 2009 1:54 PM    New/Updated Medications: ALENDRONATE SODIUM 70 MG TABS (ALENDRONATE SODIUM) 1 by mouth weekly  Appended Document: Medication Concerns  [phone]) ,med called in

## 2010-06-25 NOTE — Miscellaneous (Signed)
Summary[facility]  [facility]   Imported By: Lanelle Bal 03/04/2010 13:20:10  _____________________________________________________________________  External Attachment:    Type:   Image     Comment:   External Document

## 2010-06-25 NOTE — Progress Notes (Signed)
Summary: REASON FOR FOSAMAX REQUEST  Phone Note Call from Patient Call back at [phone] = CELL   Caller: Patient Summary of Call: [facility] CALLED TO EXPLAIN WHY SHE NEEDED PRESCRIPTION FOR FOSAMAX---WHAT EVER DR HOPPER PRESCRIBED FOR HER A MONTH AGO (SHE COULDNT REMEMBER NAME) TO REPLACE FOSOMAX WAS VERY EXPENSIVE---THAT IS WHY SHE NEEDS TO GO BACK TO FOSAMAX  PLEASE CALL [facility] TO THE [facility] [place] [facility] [place]  [phone])  ANY QUESTIONS  CALL HER CELL AT [phone] Initial call taken by: Jerolyn Shin,  November 12, 2009 1:01 PM  Follow-up for Phone Call        THERE WAS ALREADY A PHONE NOTE CREATED ON THIS!!!!! Follow-up by: Shonna Chock,  November 12, 2009 1:53 PM

## 2010-06-27 NOTE — Letter (Signed)
Summary: [facility]  [facility]   Imported By: Lanelle Bal 05/06/2010 14:04:18  _____________________________________________________________________  External Attachment:    Type:   Image     Comment:   External Document

## 2010-06-27 NOTE — Progress Notes (Signed)
Summary: urine recheck??  Phone Note Call from Patient Call back at [phone]   Caller: Patient Summary of Call: finished antibiotic yesterday for UTI---does Dr Alwyn Ren want her to come in for a urine recheck??   please call cell at [phone]---ok to leave message Initial call taken by: Jerolyn Shin,  June 14, 2010 11:45 AM  Follow-up for Phone Call        next week if having any symptoms Follow-up by: Marga Melnick MD,  June 14, 2010 4:14 PM  Additional Follow-up for Phone Call Additional follow up Details #1::        Left message on voicemail notifying patient. Additional Follow-up by: Lucious Groves CMA,  June 14, 2010 4:21 PM

## 2010-06-27 NOTE — Progress Notes (Signed)
Summary: Furosemide refill  Phone Note Refill Request Message from:  Fax from Pharmacy on June 14, 2010 10:42 AM  Refills Requested: Medication #1:  FUROSEMIDE 20 MG TABS 1 by mouth once daily [facility]IT [id number] [address] [place], [place], [place]    [phone],  fax [phone]   qty = 90  Next Appointment Scheduled: none Initial call taken by: Jerolyn Shin,  June 14, 2010 10:43 AM    Prescriptions: FUROSEMIDE 20 MG TABS (FUROSEMIDE) 1 by mouth once daily DUE OFFICE VISIT  #90 Tablet x 3   Entered by:   Shonna Chock CMA   Authorized by:   Marga Melnick MD   Signed by:   Shonna Chock CMA on 06/14/2010   Method used:   Electronically to        [facility]  [phone]* (retail)       [address] [place], [place]  [address]       Ph: [phone] or [phone]       Fax: [phone]   RxID:   [id number]

## 2010-06-27 NOTE — Assessment & Plan Note (Signed)
Summary: cpx/cbs   Vital Signs:  Patient profile:   70 year old female Height:      63 inches Weight:      112.6 pounds BMI:     20.02 Temp:     98.2 degrees F oral Pulse rate:   64 / minute Resp:     14 per minute BP sitting:   122 / 70  (left arm) Cuff size:   regular  Vitals Entered By: Shonna Chock CMA (May 28, 2010 10:59 AM) CC: 1.) CPX ( no fasting today, labs to be done tomorrow) 2.) Urinary incontience, Heartburn, Lipid Management   CC:  1.) CPX ( no fasting today, labs to be done tomorrow) 2.) Urinary incontience, Heartburn, and Lipid Management.  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 Referral Coordination: Tai Chi  recommended @ [facility] 12. Care Plan:see Instructions 13.Cognitive Assessment:Oriented X 3; memory & recall  excellent   ; Subtraction good; mood & recall normal. Hypertension Follow-Up:  BP @ home < 120/70 @ home.  The patient reports lightheadedness and urinary frequency ( mainly nocturia up to 3-4X/night), but denies headaches, edema, and fatigue.  The patient denies the following associated symptoms: chest pain, chest pressure, dyspnea, palpitations, and syncope.  Compliance with medications (by patient report) has been near 100%.  Adjunctive measures currently used by the patient include salt restriction.   ERD:  The patient denies acid reflux, sour taste in mouth, epigastric pain, and trouble swallowing.  The patient denies the following alarm  features: melena, dysphagia, and hematemesis.  Prior evaluation has included EGD.  The patient has found the following treatments to be effective: a PPI, Omeprazole once daily .  Lipid Management History:      Positive NCEP/ATP III risk factors include female age 82 years old or older and hypertension.  Negative NCEP/ATP III risk factors include non-diabetic, HDL cholesterol greater than 60, no family history for ischemic heart disease, non-tobacco-user status, no ASHD (atherosclerotic heart disease), no prior stroke/TIA, no peripheral vascular disease, and no history of aortic aneurysm.     Preventive Screening-Counseling & Management  Alcohol-Tobacco     Alcohol drinks/day: <1     Smoking Status: quit > 6 months     Year Started: 1959     Year Quit: 1985  Caffeine-Diet-Exercise     Caffeine use/day: 2 cups     Diet Comments: low carb     Does Patient Exercise: yes  Hep-HIV-STD-Contraception     Dental Visit-last 6 months yes     Sun Exposure-Excessive: no  Safety-Violence-Falls     Seat Belt Use: yes     Smoke Detectors: yes      Blood Transfusions:  no.        Travel History:  never.    Current Medications (verified): 1)  Altace 5 Mg Caps (Ramipril) .... Take 1 Tab Once Daily 2)  Furosemide 20 Mg Tabs (Furosemide) .[name].. 1 By Mouth Once Daily Due Office Visit 3)  Tramadol Hcl 50 Mg Tabs (Tramadol Hcl) .Marland KitchenMarland KitchenMarland Kitchen  2 Tabs Qhs 4)  Valium 5 Mg Tabs (Diazepam) .... Prn 5)  Prednisone 5 Mg Tabs (Prednisone) .[name].. 1 By Mouth Once Daily 6)  Alendronate Sodium 70 Mg Tabs (Alendronate Sodium) .[name].. 1 By Mouth Weekly 7)  Folic Acid 2mg  .... 1 By Mouth Once Daily 8)  Hydroxychloroquine Sulfate 200 Mg Tabs (Hydroxychloroquine Sulfate) .[name].. 1 By Mouth Once Daily 9)  Omeprazole 20 Mg Tbec (Omeprazole) .[name].. 1 By Mouth Once Daily 10)  Calcium 1200mg  .... 1 By Mouth Once Daily 11)  Biotin 5000 5 Mg Caps (Biotin) .[name].. 1 By Mouth Once Daily 12)  Omega-3 350 Mg Caps (Omega-3 Fatty Acids) .[name].. 1 By  Mouth Once Daily 13)  Vitamin D3 2000 Unit Caps (Cholecalciferol) .[name].. 1 By Mouth Once Daily  Allergies: 1)  ! Pcn 2)  ! * Vytorin 3)  ! Demerol 4)  ! 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 stomach cancer d 1 ,RHD  Mother: d pancreatic cancer  Siblings sister d  stomach cancer ; son :Rondel Baton aunt: breast  cancer ;daughter: MS;Paunt: CAD;MGM: cervical  cancer   Social History: Retired Married Alcohol use-yes Regular exercise-yes Caffeine use/day:  2 cups Dental Care w/in 6 mos.:  yes Sun Exposure-Excessive:  no Seat Belt Use:  yes Blood Transfusions:  no Smoking Status:  quit > 6 months  Review of Systems GI:  Colonoscopy due. GU:  Denies discharge, dysuria, and hematuria; Urinary retention 05/17/2010, seen @ [facility] [address] [address] . Antibiotic Rxed for UTI. Now nocturia 4-5X /night.. MS:  BMD done 04/2010 by Dr Marcelle Overlie. Endo:  Manmmograms done 2011.  Physical Exam  General:  Thin,in no acute distress; alert,appropriate and cooperative throughout examination Head:  Normocephalic and atraumatic without obvious abnormalities. Eyes:  No corneal or conjunctival inflammation noted. Perrla. Funduscopic exam benign, without hemorrhages, exudates or papilledema. Ears:  External ear exam shows no significant lesions or deformities.  Otoscopic examination reveals clear  canals, tympanic membranes are intact bilaterally without bulging, retraction, inflammation or discharge. Hearing is grossly normal bilaterally. Nose:  External nasal examination shows no deformity or inflammation. Nasal mucosa are pink and moist without lesions or exudates. Mouth:  Oral mucosa and oropharynx without lesions or exudates.  Teeth in good repair. Neck:  No deformities or  masses. Slight  tenderness noted to palpation Breasts:  Dr Marcelle Overlie Lungs:  Normal respiratory effort, chest expands symmetrically. Lungs are clear to auscultation, no crackles or wheezes. Heart:  Normal rate and regular rhythm. S1 and S2 normal without gallop, murmur, click, rub . S4 Abdomen:  Bowel sounds positive,abdomen soft slightly tender suprapubic area  without masses, organomegaly or hernias noted. Rectal:  Colonoscopy due Genitalia:  Dr Marcelle Overlie Msk:  No deformity or scoliosis noted of thoracic or lumbar spine.   Pulses:  R and L carotid,radial,dorsalis pedis and posterior tibial pulses are full and equal bilaterally Extremities:  No clubbing, cyanosis, edema. RA hand  deformities  noted with normal full range  of motion of all joints.   Neurologic:  alert & oriented X3, strength normal in all extremities, and DTRs symmetrical ; 1/2 + @ knees  Skin:  Intact without suspicious lesions or rashes Cervical Nodes:  No lymphadenopathy noted Axillary Nodes:  No palpable lymphadenopathy Psych:  memory intact for recent and remote, normally interactive, and good eye contact.     Impression & Recommendations:  Problem # 1:  PREVENTIVE HEALTH CARE (ICD-V70.0)  Orders: Medicare -1st Annual Wellness Visit [phone])  Problem # 2:  HYPERTENSION (ICD-401.9) Assessment: Improved  Her updated medication list for this problem includes:    Furosemide 20 Mg Tabs (Furosemide) .[name]... 1 by mouth once daily due office visit  Orders: Medicare -1st Annual Wellness Visit [phone]) EKG w/ Interpretation (93000)  Problem # 3:   NOCTURIA (TKZ-601.09)  Orders: Medicare -1st Annual Wellness Visit [phone])  Problem # 4:  MULTIPLE SCLEROSIS (ICD-340)  Orders: Medicare -1st Annual Wellness Visit [phone])  Problem # 5:  HYPERLIPIDEMIA (ICD-272.4)  PMH of  Orders: Medicare -1st Annual Wellness Visit [id number])  Problem # 6:  THYROID NODULE (ICD-241.0)   PMH of;thyroid area tenderness on exam  Orders: Medicare -1st Annual Wellness Visit [phone]) Radiology Referral (Radiology)  Complete Medication List: 1)  Altace 5 Mg Caps (ramipril)  .... Take 1 tab once daily 2)  Furosemide 20 Mg Tabs (Furosemide) .[name].. 1 by mouth once daily due office visit 3)  Tramadol Hcl 50 Mg Tabs (Tramadol hcl) .... 2 tabs qhs 4)  Valium 5 Mg Tabs (Diazepam) .... Prn 5)  Prednisone 5 Mg Tabs (Prednisone) .[name].. 1 by mouth once daily 6)  Alendronate Sodium 70 Mg Tabs (Alendronate sodium) .[name].. 1 by mouth weekly 7)  Folic Acid 2mg   .... 1 by mouth once daily 8)  Hydroxychloroquine Sulfate 200 Mg Tabs (Hydroxychloroquine sulfate) .[name].. 1 by mouth once daily 9)  Omeprazole 20 Mg Tbec (Omeprazole) .[name].. 1 by mouth once daily 10)  Calcium 1200mg   .... 1 by mouth once daily 11)  Biotin 5000 5 Mg Caps (Biotin) .[name].. 1 by mouth once daily 12)  Omega-3 350 Mg Caps (Omega-3 fatty acids) .[name].. 1 by mouth once daily 13)  Vitamin D3 2000 Unit Caps (Cholecalciferol) .[name].. 1 by mouth once daily  Other Orders: Tdap => 11yrs IM (62831) Admin 1st Vaccine (51761)  Lipid Assessment/Plan:      Based on NCEP/ATP III, the patient's risk factor category is "0-1 risk factors".  The patient's lipid goals are as follows: Total cholesterol goal is 200; LDL cholesterol goal is 160; HDL cholesterol goal is 40; Triglyceride goal is 150.    Patient Instructions: 1)  Please schedule fasting labs: 2)  BMP(401.9) ; 3)  Lipid Panel(272.4) ; 4)  TSH ,free T4, free T3 ( 241.0) 5)  Avoid foods high in acid (tomatoes, citrus juices, spicy foods). Avoid eating within two hours  of lying down or before exercising. Do not over eat; try smaller more frequent meals. Elevate head of bed twelve inches when sleeping. 6)  Schedule a colonoscopy  to help detect colon cancer. 7)  Check your Blood Pressure regularly. If it is above:135/85 ON AVERAGE  you should make an appointment. Consider tai Chi  to improve balance as discussed. 8)  Urine-dip with micro & C&S (788.43). Consider Urology referral if urinalysis & culture are negative.   Orders Added: 1)  Tdap => 62yrs IM [90715] 2)  Admin 1st Vaccine [90471] 3)  Est. Patient Level III [60737] 4)  Medicare -1st  Annual Wellness Visit [G0438] 5)  EKG w/ Interpretation [93000] 6)  Radiology Referral [Radiology] 7)  Medicare -1st Annual Wellness Visit [G0438] 8)  Est. Patient Level III [56387]   Immunization History:  Pneumovax Immunization History:    Pneumovax:  historical (05/26/2006)  Immunizations Administered:  Tetanus Vaccine:    Vaccine Type: Tdap    Site: right deltoid    Mfr: GlaxoSmithKline    Dose: 0.5 ml    Route: IM    Given by: Shonna Chock CMA    Exp. Date: 03/14/2012    Lot #: [id number]    VIS given: 04/12/08 version given May 28, 2010.   Immunization History:  Pneumovax Immunization History:    Pneumovax:  Historical (05/26/2006)  Immunizations Administered:  Tetanus Vaccine:    Vaccine Type: Tdap    Site: right deltoid    Mfr: GlaxoSmithKline    Dose: 0.5 ml    Route: IM    Given by: Shonna Chock CMA    Exp. Date: 03/14/2012    Lot #: [id number]    VIS given: 04/12/08 version given May 28, 2010.

## 2010-06-27 NOTE — Letter (Signed)
Summary: New Patient letter  [facility]  [address] [place], [place] [address]   Phone: [phone]  Fax: [phone]       05/28/2010 MRN: [id number]  WILBERT SCHOUTEN [address] [place], [place]  [address]  Dear Ms. Ruthann Cancer,  Welcome to the Gastroenterology Division at [facility]    You are scheduled to see Dr.  Melvia Heaps on July 03, 2010 at 1:30pm on the 3rd floor at [facility], [address]  We ask that you try to arrive at our office 15 minutes prior to your appointment time to allow for check-in.  We would like you to complete the enclosed self-administered evaluation form prior to your visit and bring it with you on the day of your appointment.  We will review it with you.  Also, please bring a complete list of all your medications or, if you prefer, bring the medication bottles and we will list them.  Please bring your insurance card so that we may make a copy of it.  If your insurance requires a referral to see a specialist, please bring your referral form from your primary care physician.  Co-payments are due at the time of your visit and may be paid by cash, check or credit card.     Your office visit will consist of a consult with your physician (includes a physical exam), any laboratory testing he/she may order, scheduling of any necessary diagnostic testing (e.g. x-ray, ultrasound, CT-scan), and scheduling of a procedure (e.g. Endoscopy, Colonoscopy) if required.  Please allow enough time on your schedule to allow for any/all of these possibilities.    If you cannot keep your appointment, please call [phone] to cancel or reschedule prior to your appointment date.  This allows [place] the opportunity to schedule an appointment for another patient in need of care.  If you do not cancel or reschedule by 5 p.m. the business day prior to your appointment date, you will be charged a $50.00 late cancellation/no-show fee.    Thank  you for choosing [facility] for your medical needs.  We appreciate the opportunity to care for you.  Please visit [place] at our website  to learn more about our practice.                     Sincerely,                                                             The Gastroenterology Division

## 2010-06-27 NOTE — Letter (Signed)
Summary: [facility]  [facility]   Imported By: Lanelle Bal 06/14/2010 08:57:12  _____________________________________________________________________  External Attachment:    Type:   Image     Comment:   External Document

## 2010-07-03 ENCOUNTER — Encounter: Payer: Self-pay | Admitting: Gastroenterology

## 2010-07-03 ENCOUNTER — Other Ambulatory Visit: Payer: Self-pay | Admitting: Gastroenterology

## 2010-07-03 ENCOUNTER — Other Ambulatory Visit: Payer: MEDICARE

## 2010-07-03 ENCOUNTER — Ambulatory Visit (INDEPENDENT_AMBULATORY_CARE_PROVIDER_SITE_OTHER): Payer: MEDICARE | Admitting: Gastroenterology

## 2010-07-03 DIAGNOSIS — Z860100 Personal history of colon polyps, unspecified: Secondary | ICD-10-CM

## 2010-07-03 DIAGNOSIS — Z800 Family history of malignant neoplasm of digestive organs: Secondary | ICD-10-CM

## 2010-07-03 DIAGNOSIS — R351 Nocturia: Secondary | ICD-10-CM

## 2010-07-04 ENCOUNTER — Ambulatory Visit
Admission: RE | Admit: 2010-07-04 | Discharge: 2010-07-04 | Disposition: A | Discharge status: Home / Self Care | Payer: MEDICARE | Source: Ambulatory Visit | Attending: Internal Medicine | Admitting: Internal Medicine

## 2010-07-04 DIAGNOSIS — E041 Nontoxic single thyroid nodule: Secondary | ICD-10-CM

## 2010-07-04 LAB — URINALYSIS, ROUTINE W REFLEX MICROSCOPIC
Bilirubin Urine: NEGATIVE
Ketones, ur: NEGATIVE
Total Protein, Urine: NEGATIVE
pH: 7 (ref 5.0–8.0)

## 2010-07-05 ENCOUNTER — Other Ambulatory Visit: Payer: Self-pay | Admitting: Internal Medicine

## 2010-07-05 ENCOUNTER — Encounter (INDEPENDENT_AMBULATORY_CARE_PROVIDER_SITE_OTHER): Payer: Self-pay | Admitting: *Deleted

## 2010-07-05 ENCOUNTER — Other Ambulatory Visit: Payer: MEDICARE

## 2010-07-05 DIAGNOSIS — R351 Nocturia: Secondary | ICD-10-CM

## 2010-07-05 LAB — URINALYSIS, ROUTINE W REFLEX MICROSCOPIC
Bilirubin Urine: NEGATIVE
Nitrite: NEGATIVE
Specific Gravity, Urine: 1.01 (ref 1.000–1.030)
Urobilinogen, UA: 0.2 (ref 0.0–1.0)
pH: 7 (ref 5.0–8.0)

## 2010-07-06 ENCOUNTER — Encounter: Payer: Self-pay | Admitting: Internal Medicine

## 2010-07-11 NOTE — Assessment & Plan Note (Signed)
Summary: CONSULT BEFORE ECL/EM SCHED W-PT/INS UHC/COPAY & CX FEE ADVIC...   History of Present Illness Visit Type: Initial Consult Primary GI MD: Melvia Heaps MD [facility] Primary Provider: ,Marga Melnick, MD Requesting Provider: Marga Melnick, MD Chief Complaint: Screening ECL, family hx History of Present Illness:   Sherri [name] is a pleasant 70 year old white female at the request of Dr. Alwyn Ren for screening upper endoscopy and colonoscopy.  She has a history of colon polyps.  Last exam in 2007 apparently was negative.  Family history is pertinent for her father father and sister with stomach cancer and several members of her mother's family with colon cancer.  Her main complaint is mild constipation. She has occasional pyrosis and excess belching.  Endoscopy 5 years ago apparently was negative.When she cleans herself after urinating she may see some fecal soilage.  She has had several urinary tract infections.   GI Review of Systems    Reports acid reflux, belching, and  bloating.      Denies abdominal pain, chest pain, dysphagia with liquids, dysphagia with solids, heartburn, loss of appetite, nausea, vomiting, vomiting blood, weight loss, and  weight gain.      Reports constipation and  rectal pain.     Denies anal fissure, black tarry stools, change in bowel habit, diarrhea, diverticulosis, fecal incontinence, heme positive stool, hemorrhoids, irritable bowel syndrome, jaundice, light color stool, liver problems, and  rectal bleeding. Preventive Screening-Counseling & Management  Alcohol-Tobacco     Smoking Status: quit    Current Medications (verified): 1)  Altace 5 Mg Caps (Ramipril) .... Take 1 Tab Once Daily 2)  Furosemide 20 Mg Tabs (Furosemide) .[name].. 1 By Mouth Once Daily Due Office Visit 3)  Tramadol Hcl 50 Mg Tabs (Tramadol Hcl) .... 2 Tabs Qhs 4)  Valium 5 Mg Tabs (Diazepam) .... Prn 5)  Prednisone 5 Mg Tabs (Prednisone) .[name].. 1 By Mouth Once Daily 6)  Alendronate Sodium 70  Mg Tabs (Alendronate Sodium) .[name].. 1 By Mouth Weekly 7)  Folic Acid 2mg  .... 1 By Mouth Once Daily 8)  Hydroxychloroquine Sulfate 200 Mg Tabs (Hydroxychloroquine Sulfate) .[name].. 1 By Mouth Once Daily 9)  Omeprazole 20 Mg Tbec (Omeprazole) .[name].. 1 By Mouth Once Daily 10)  Calcium 1200mg  .... 1 By Mouth Once Daily 11)  Biotin 5000 5 Mg Caps (Biotin) .[name].. 1 By Mouth Once Daily 12)  Omega-3 350 Mg Caps (Omega-3 Fatty Acids) .[name].. 1 By Mouth Once Daily 13)  Vitamin D3 2000 Unit Caps (Cholecalciferol) .[name].. 1 By Mouth Once Daily 14)  Methotrexate Sodium 1 Gm Solr (Methotrexate Sodium) .... Inject  Subcutaneously Weekly  Allergies (verified): 1)  ! Pcn 2)  ! * Vytorin 3)  ! Demerol 4)  ! * Imuran  Past History:  Past Medical History: Cervical cancer in situ, PMH of; Gout, PMH of; Hyperlipidemia: Framingham Study LDL goal = < 160 Hypertension; Osteoarthritis; Rheumatoid arthritis , Dr Corliss Skains; Multiple sclerosis,Dr Sater; Stress ulcer 1963 GERD Hypothyroidism  Past Surgical History: Reviewed history from 05/28/2010 and no changes required. 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 stomach cancer d 10 ,RHD  Mother: d pancreatic cancer  Siblings sister d  stomach cancer ; son :Rondel Baton aunt: breast  cancer ;daughter: MS;Paunt: CAD;MGM: cervical  cancer  Family History of Diabetes:   Social History: Retired IT trainer Married Alcohol use-yes Regular exercise-yes Patient is a former smoker.  Daily Caffeine Use Smoking Status:  quit  Review of Systems       The patient complains of allergy/sinus, arthritis/joint pain, back pain, hearing problems, muscle  pains/cramps, and sleeping problems.  The patient denies anemia, anxiety-new, blood in urine, breast changes/lumps, change in vision, confusion, cough, coughing up blood, depression-new, fainting, fatigue, fever, headaches-new, heart murmur, heart rhythm changes, itching, menstrual pain, night sweats, nosebleeds, pregnancy symptoms, shortness of breath, skin rash, sore throat, swelling of feet/legs, swollen lymph glands, thirst - excessive, urination - excessive, urination changes/pain, urine leakage, vision changes, and voice change.         She has slight burning with urination.  All other systems were reviewed and were negative   Vital Signs:  Patient profile:   70 year old female Height:      64 inches Weight:      113 pounds BMI:     19.47 Pulse rate:   68 / minute Pulse rhythm:   regular BP sitting:   110 / 70 Cuff size:   regular  Vitals Entered By: June McMurray CMA Duncan Dull) (July 03, 2010 1:17 PM)  Physical Exam  Additional Exam:  On physical exam she is a well-developed well-nourished female  skin: anicteric HEENT: normocephalic; PEERLA; no nasal or pharyngeal abnormalities neck: supple nodes: no cervical lymphadenopathy chest: clear to ausculatation and percussion heart: no murmurs, gallops, or rubs abd: soft, nontender; BS normoactive; no abdominal masses, tenderness, organomegaly rectal: no masses ext: no cynanosis, clubbing, edema skeletal: no deformities neuro: oriented x 3; no focal abnormalities    Impression & Recommendations:  Problem # 1:  NEOPLASM, MALIGNANT, STOMACH, FAMILY HX (ICD-V16.0)  Plan surveillance endoscopy  Orders: Colon/Endo (Colon/Endo)  Problem # 2:  PERSONAL HISTORY OF COLONIC POLYPS (ICD-V12.72)  Plan a colonoscopy  Orders: Colon/Endo (Colon/Endo)  Problem # 3:  MULTIPLE SCLEROSIS (ICD-340) Assessment: Comment Only  Problem # 4:  RHEUMATOID ARTHRITIS (ICD-714.0) Assessment: Comment Only  Other Orders: TLB-Udip w/ Micro  (81001-URINE)  Patient Instructions: 1)  Copy sent to : ,Marga Melnick, MD 2)  Your Colonoscopy/Endoscopy is scheduled on 07/16/2010 at 2pm 3)  You can pick up your MoviPrep from your pharmacy today 4)  Colonoscopy and Flexible Sigmoidoscopy brochure given.  5)  Conscious Sedation brochure given.  6)  Upper Endoscopy brochure given.  7)  The medication list was reviewed and reconciled.  All changed / newly prescribed medications were explained.  A complete medication list was provided to the patient / caregiver. Prescriptions: MOVIPREP 100 GM  SOLR (PEG-KCL-NACL-NASULF-NA ASC-C) As per prep instructions.  #1 x 0   Entered by:   Merri Ray CMA (AAMA)   Authorized by:   Louis Meckel MD   Signed by:   Merri Ray CMA (AAMA) on 07/03/2010   Method used:   Electronically to        [facility]  [phone]* (retail)       [address] [place], [place]  [address]       Ph: [phone] or [phone]       Fax: [phone]   RxID:   [id number]

## 2010-07-11 NOTE — Letter (Signed)
Summary: Results Letter  [facility]  [address] [place], [place] [address]   Phone: [phone]  Fax: [phone]        July 03, 2010 MRN: [id number]    CORALYN ROSELLI [address] [place], [place]  [address]    Dear Ms. Furuya,  It is my pleasure to have treated you recently as a new patient in my office. I appreciate your confidence and the opportunity to participate in your care.  Since I do have a busy inpatient endoscopy schedule and office schedule, my office hours vary weekly. I am, however, available for emergency calls everyday through my office. If I am not available for an urgent office appointment, another one of our gastroenterologist will be able to assist you.  My well-trained staff are prepared to help you at all times. For emergencies after office hours, a physician from our Gastroenterology section is always available through my 24 hour answering service  Once again I welcome you as a new patient and I look forward to a happy and healthy relationship             Sincerely,  Louis Meckel MD  This letter has been electronically signed by your physician.  Appended Document: Results Letter LETTER MAILED

## 2010-07-11 NOTE — Letter (Signed)
Summary: [facility] Instructions  [facility]  [address] [place], [place] [address]   Phone: [phone]  Fax: [phone]       Sherri [name]    May 02, 1941    MRN: [id number]        Procedure Day /Date:TUESDAY 07/16/2010     Arrival Time:1PM     Procedure Time:2PM     Location of Procedure:                    X   [facility] (4th Floor)   PREPARATION FOR COLONOSCOPY WITH MOVIPREP   Starting 5 days prior to your procedure 07/11/2010  do not eat nuts, seeds, popcorn, corn, beans, peas,  salads, or any raw vegetables.  Do not take any fiber supplements (e.g. Metamucil, Citrucel, and Benefiber).  THE DAY BEFORE YOUR PROCEDURE         DATE: 07/15/2010  DAY: MONDAY  1.  Drink clear liquids the entire day-NO SOLID FOOD  2.  Do not drink anything colored red or purple.  Avoid juices with pulp.  No orange juice.  3.  Drink at least 64 oz. (8 glasses) of fluid/clear liquids during the day to prevent dehydration and help the prep work efficiently.  CLEAR LIQUIDS INCLUDE: Water Jello Ice Popsicles Tea (sugar ok, no milk/cream) Powdered fruit flavored drinks Coffee (sugar ok, no milk/cream) Gatorade Juice: apple, white grape, white cranberry  Lemonade Clear bullion, consomm, broth Carbonated beverages (any kind) Strained chicken noodle soup Hard Candy                             4.  In the morning, mix first dose of MoviPrep solution:    Empty 1 Pouch A and 1 Pouch B into the disposable container    Add lukewarm drinking water to the top line of the container. Mix to dissolve    Refrigerate (mixed solution should be used within 24 hrs)  5.  Begin drinking the prep at 5:00 p.m. The MoviPrep container is divided by 4 marks.   Every 15 minutes drink the solution down to the next mark (approximately 8 oz) until the full liter is complete.   6.  Follow completed prep with 16 oz of clear liquid of your choice (Nothing red or purple).  Continue  to drink clear liquids until bedtime.  7.  Before going to bed, mix second dose of MoviPrep solution:    Empty 1 Pouch A and 1 Pouch B into the disposable container    Add lukewarm drinking water to the top line of the container. Mix to dissolve    Refrigerate  THE DAY OF YOUR PROCEDURE      DATE: 07/16/2010 [id number]  Beginning at 9 a.m. (5 hours before procedure):         1. Every 15 minutes, drink the solution down to the next mark (approx 8 oz) until the full liter is complete.  2. Follow completed prep with 16 oz. of clear liquid of your choice.    3. You may drink clear liquids until 12PM (2 HOURS BEFORE PROCEDURE).   MEDICATION INSTRUCTIONS  Unless otherwise instructed, you should take regular prescription medications with a small sip of water   as early as possible the morning of your procedure.       OTHER INSTRUCTIONS  You will need a responsible adult at least 69 years of age to accompany you and  drive you home.   This person must remain in the waiting room during your procedure.  Wear loose fitting clothing that is easily removed.  Leave jewelry and other valuables at home.  However, you may wish to bring a book to read or  an iPod/MP3 player to listen to music as you wait for your procedure to start.  Remove all body piercing jewelry and leave at home.  Total time from sign-in until discharge is approximately 2-3 hours.  You should go home directly after your procedure and rest.  You can resume normal activities the  day after your procedure.  The day of your procedure you should not:   Drive   Make legal decisions   Operate machinery   Drink alcohol   Return to work  You will receive specific instructions about eating, activities and medications before you leave.    The above instructions have been reviewed and explained to me by   _______________________    I fully understand and can verbalize these instructions  _____________________________ Date _________

## 2010-07-11 NOTE — Procedures (Signed)
Summary: Esophagogastroduodenscopy followed by colonoscopy    NAME:  Sherri [name], Sherri [name]             ACCOUNT NO.:  [id number]   MEDICAL RECORD NO.:  [id number]          PATIENT TYPE:  AMB   LOCATION:  DAY                           FACILITY:  [facility]   PHYSICIAN:  Lionel December, M.D.    DATE OF BIRTH:  02-14-1941   DATE OF PROCEDURE:  09/11/2005  DATE OF DISCHARGE:                                 OPERATIVE REPORT   PROCEDURE:  Esophagogastroduodenoscopy followed by colonoscopy.   INDICATIONS:  The patient is a 70 year old Caucasian female with chronic  GERD whose symptoms are well-controlled with therapy who has multiple heme-  positive stools, although her H&H is normal.  Family history is significant  for a gastric carcinoma in sibling with died of it and colon carcinoma in  second-degree relative.  She is on Fosamax and Relafen and therefore could  have peptic ulcer disease or injury to any part of the GI tract.  However,  need to make sure she does not have occult neoplasm.  Her last exam was in  September 2004.   Procedure and risks were reviewed with the patient, and informed consent was  obtained.   MEDICATIONS FOR CONSCIOUS SEDATION:  Benzocaine spray for pharyngeal topical  anesthesia.  Fentanyl 75 mcg q.d. and Versed 14 mg q.d.   FINDINGS:  Procedure performed in endoscopy suite.  The patient's vital  signs and O2 saturation were monitored during the procedure and remained  stable.   PROCEDURE #1:  Esophagogastroduodenoscopy.  The patient was placed in left  lateral position.  Olympus videoscope was passed via oropharynx without any  difficulty into esophagus.   Esophagus.  Mucosa of the esophagus was normal.  The GE junction was  serrated/wavy.  This area had been previously biopsied and was negative for  Barrett's.   Stomach. It was empty and distended very well with insufflation.  Folds of  proximal stomach were normal.  Examination of mucosa at body, antrum,  pyloric  channel as well as angularis, fundus and cardia was normal.   Duodenum. Bulbar mucosa was normal.  Scope was passed into second and third  part of the duodenum where mucosa and folds were normal.  Endoscope was  withdrawn.  The patient prepared for procedure #2.   PROCEDURE #2:  Colonoscopy.  Rectal examination performed.  No abnormality  noted on external or digital exam.  Olympus videoscope was placed in rectum  and advanced under vision into sigmoid colon and beyond.  Preparation was  excellent.  Scope was passed into cecum which was identified by ileocecal  valve and appendiceal stump.  Pictures taken for the record.  As the scope  was withdrawn, colonic mucosa was examined for the second time, and there  were no AV malformations, polyps, mass or diverticular changes.  Rectal  mucosa was normal.  Scope was retroflexed to examine anorectal junction, and  small hemorrhoids were noted below the dentate line.  Endoscope was  straightened and withdrawn.  The patient tolerated the procedures well.   FINAL DIAGNOSIS:  1.  Normal esophagogastroduodenoscopy.  2.  Normal colonoscopy except  small external hemorrhoids.   RECOMMENDATIONS:  We will continue to monitor her H&H.  She is to have one  prior to neck surgery which is planned in near future, and if that is  normal, she have another one in six months.  If there is evidence of obvious  bleed or drop in her H&H, we would consider small bowel Given capsule study.      Lionel December, M.D.  Electronically Signed     NR/MEDQ  D:  09/11/2005  T:  09/11/2005  Job:  [id number]   cc:   Titus Dubin. Alwyn Ren, M.D. [facility]  [phone] [place]  [place] [address]

## 2010-07-11 NOTE — Procedures (Signed)
Summary: Esophagogastroduodenoscopy, followed by total colonoscopy    NAME:  Sherri [name], Sherri [name]                       ACCOUNT NO.:  [id number]   MEDICAL RECORD NO.:  [id number]                   PATIENT TYPE:  AMB   LOCATION:  DAY                                  FACILITY:  [facility]   PHYSICIAN:  Lionel December, M.D.                 DATE OF BIRTH:  Mar 27, 1941   DATE OF PROCEDURE:  02/10/2003  DATE OF DISCHARGE:  02/10/2003                                 OPERATIVE REPORT   PROCEDURES:  Esophagogastroduodenoscopy, followed by total colonoscopy.   INDICATIONS:  Sherri [name] is a 70 year old Caucasian female who presents with  epigastric pain, early satiety, weight loss, and heme-positive stools.  Family is significant for various GI malignancies.   She is undergoing a diagnostic evaluation.  The patient has been on PPI  chronically.  The dose was doubled and her NSAID therapy was discontinued,  but she does not feel any better.  Both the procedures were reviewed with  the patient, and informed consent was obtained.   PREMEDICATION:  Cetacaine spray for pharyngeal topical anesthesia, fentanyl  45 mcg IV, Versed 18 mg IV in divided dose.   FINDINGS:  Procedure performed in endoscopy suite.  The patient's vital  signs and O2 saturation were monitored during the procedure and remained  stable.   Procedure #1, esophagogastroduodenoscopy:  The patient was placed in left  lateral recumbent position and the Olympus video scope was passed via  oropharynx without any difficulty into esophagus.   Esophagus:  Mucosa of the esophagus was normal.  She had a 3 cm long sliding  hiatal hernia.   Stomach:  It was empty and distended very well with insufflation.  Folds of  proximal stomach were normal.  Examination of the mucosa revealed single  small polyp at gastric body, which was ablated by a cold biopsy.  The  pyloric channel was patent.  Angularis, fundus, and cardia were examined by  retroflexing  the scope and were normal.   Duodenum:  Examination of the bulb revealed normal mucosa.  The scope was  passed to the second part of the duodenum, where mucosa and folds were  normal.  Endoscope was withdrawn and the patient prepared for procedure #2.   Total colonoscopy:  Rectal examination performed.  No abnormality noted on  external or digital exam.  The Olympus video scope was placed in the rectum,  advanced under vision in the sigmoid colon and beyond.  Preparation was  excellent except she had stool at the cecum.  Cecal landmarks were well-seen  and photographed for the record.  As the scope was withdrawn the colonic  mucosa was once again carefully examined.  There were no polyps, tumor  masses, or diverticular changes.  The scope was retroflexed to examine the  anorectal junction, and small hemorrhoids were noted below the dentate line.  The endoscope was straightened and withdrawn.  The  patient tolerated the  procedures well.    FINAL DIAGNOSES:  1. Small sliding hiatal hernia without endoscopic evidence of reflux     esophagitis.  2. Small gastric polyp, which was ablated by cold biopsy.  No evidence of     peptic ulcer disease.  3. Normal colonoscopy except small external hemorrhoids.   As far as her heme-positive stools are concerned, it is possible that this  is due to prior nonsteroidal anti-inflammatory drug use or small bowel  injury.  Her hemoglobin and hematocrit are normal, and I do not feel further  workup is indicative unless her epigastric pain and weight loss continue.   Please note that her chemistry panel today is within normal limits.  The  patient was given a copy, but she will give it to Dr. Alwyn Ren for his  records.   RECOMMENDATIONS:  1. She will drop her Nexium to 40 mg p.o. q.a.m.  2. Bentyl 10 mg four each meal.  3. Will schedule her for upper abdominal ultrasound with attention to     gallbladder, bile duct, and pancreas.  I will be consulting  the patient     with results of the study and further recommendations.                                               Lionel December, M.D.    NR/MEDQ  D:  02/11/2003  T:  02/13/2003  Job:  [id number]   cc:   Titus Dubin. Alwyn Ren, M.D. [facility]

## 2010-07-12 ENCOUNTER — Telehealth: Payer: Self-pay | Admitting: Gastroenterology

## 2010-07-16 ENCOUNTER — Encounter: Payer: Self-pay | Admitting: Gastroenterology

## 2010-07-16 ENCOUNTER — Encounter (AMBULATORY_SURGERY_CENTER): Payer: MEDICARE | Admitting: Gastroenterology

## 2010-07-16 DIAGNOSIS — Z1211 Encounter for screening for malignant neoplasm of colon: Secondary | ICD-10-CM

## 2010-07-16 DIAGNOSIS — Z860100 Personal history of colon polyps, unspecified: Secondary | ICD-10-CM

## 2010-07-16 DIAGNOSIS — K219 Gastro-esophageal reflux disease without esophagitis: Secondary | ICD-10-CM

## 2010-07-16 DIAGNOSIS — Z85028 Personal history of other malignant neoplasm of stomach: Secondary | ICD-10-CM

## 2010-07-16 HISTORY — PX: ESOPHAGOGASTRODUODENOSCOPY: SHX1529

## 2010-07-16 HISTORY — PX: COLONOSCOPY: SHX174

## 2010-07-17 NOTE — Progress Notes (Signed)
Summary: Questions about meds  Phone Note Call from Patient Call back at [phone]   Caller: Patient Call For: Dr. Arlyce Dice Reason for Call: Talk to Nurse Summary of Call: Has some questions about her meds before her procedure on Tues. Initial call taken by: Karna Christmas,  July 12, 2010 2:08 PM  Follow-up for Phone Call        Pt had questions about whether or not she should take her antiinfammatory medications.Told her she can continue to take she does not have to hold those medications before her procedure. Follow-up by: Merri Ray CMA Duncan Dull),  July 12, 2010 2:40 PM  Additional Follow-up for Phone Call Additional follow up Details #1::        ok Additional Follow-up by: Louis Meckel MD,  July 12, 2010 3:25 PM

## 2010-07-23 NOTE — Procedures (Signed)
Summary: Upper Endoscopy  Patient: [name] Note: All result statuses are Final unless otherwise noted.  Tests: (1) Upper Endoscopy (EGD)   EGD Upper Endoscopy       DONE     [facility]     [address]     [place], [place]  [address]           ENDOSCOPY PROCEDURE REPORT           PATIENT:  Sherri [name], Sherri [name]  MR#:  [id number]     BIRTHDATE:  Sep 14, 1940, 69 yrs. old  GENDER:  female           ENDOSCOPIST:  Barbette Hair. Arlyce Dice, MD     Referred by:  Marga Melnick, M.D.           PROCEDURE DATE:  07/16/2010     PROCEDURE:  EGD, diagnostic 43235     ASA CLASS:  Class II     INDICATIONS:  GERD, other Father and sibling with stomach Ca           MEDICATIONS:   There was residual sedation effect present from     prior procedure., Versed 1 mg IV, glycopyrrolate (Robinal) 0.2 mg     IV, 0.6cc simethancone 0.6 cc PO     TOPICAL ANESTHETIC:  Exactacain Spray           DESCRIPTION OF PROCEDURE:   After the risks benefits and     alternatives of the procedure were thoroughly explained, informed     consent was obtained.  The LB GIF-H180 [id number] endoscope was     introduced through the mouth and advanced to the third portion of     the duodenum, without limitations.  The instrument was slowly     withdrawn as the mucosa was fully examined.     <<PROCEDUREIMAGES>>           The upper, middle, and distal third of the esophagus were     carefully inspected and no abnormalities were noted. The z-line     was well seen at the GEJ. The endoscope was pushed into the fundus     which was normal including a retroflexed view. The antrum,gastric     body, first and second part of the duodenum were unremarkable (see     image2, image4, image5, image6, image7, image1, and image8).     Retroflexed views revealed no abnormalities.    The scope was then     withdrawn from the patient and the procedure completed.     COMPLICATIONS:  None           ENDOSCOPIC IMPRESSION:     1) Normal  EGD     RECOMMENDATIONS:     1) continue PPI           REPEAT EXAM:  No           ______________________________     Barbette Hair. Arlyce Dice, MD           CC:           n.     eSIGNED:   Barbette Hair. Zerick Prevette at 07/16/2010 02:45 PM           [place], [place], [id number]  Note: An exclamation mark (!) indicates a result that was not dispersed into the flowsheet. Document Creation Date: 07/16/2010 2:45 PM _______________________________________________________________________  (1) Order result status: Final Collection or observation date-time: 07/16/2010 14:42 Requested date-time:  Receipt date-time:  Reported date-time:  Referring Physician:   Ordering Physician: Melvia Heaps [phone]) Specimen Source:  Source: Launa Grill Order Number: [id number] Lab site:

## 2010-07-23 NOTE — Procedures (Signed)
Summary: Colonoscopy  Patient: [name] Note: All result statuses are Final unless otherwise noted.  Tests: (1) Colonoscopy (COL)   COL Colonoscopy           DONE     [facility]     [address]     [place], [place]  [address]           COLONOSCOPY PROCEDURE REPORT           PATIENT:  Sherri [name], Sherri [name]  MR#:  [id number]     BIRTHDATE:  Apr 13, 1941, 69 yrs. old  GENDER:  female           ENDOSCOPIST:  Barbette Hair. Arlyce Dice, MD     Referred by:  Marga Melnick, M.D.           PROCEDURE DATE:  07/16/2010     PROCEDURE:  Diagnostic Colonoscopy     ASA CLASS:  Class II     INDICATIONS:  1) screening  2) history of pre-cancerous     (adenomatous) colon polyps Last colo 2007 normal           MEDICATIONS:   Fentanyl 75 mcg IV, Versed 8 mg IV           DESCRIPTION OF PROCEDURE:   After the risks benefits and     alternatives of the procedure were thoroughly explained, informed     consent was obtained.  Digital rectal exam was performed and     revealed no abnormalities.   The LB CF-[id number] endoscope     was introduced through the anus and advanced to the cecum, which     was identified by both the appendix and ileocecal valve, without     limitations.  The quality of the prep was excellent, using     MoviPrep.  The instrument was then slowly withdrawn as the colon     was fully examined.     <<PROCEDUREIMAGES>>           FINDINGS:  A normal appearing cecum, ileocecal valve, and     appendiceal orifice were identified. The ascending, hepatic     flexure, transverse, splenic flexure, descending, sigmoid colon,     and rectum appeared unremarkable (see image1, image2, image4,     image5, image8, image10, and image11).   Retroflexed views in the     rectum revealed no abnormalities.    The time to cecum =  3.25     minutes. The scope was then withdrawn (time =  6.5  min) from the     patient and the procedure completed.           COMPLICATIONS:  None        ENDOSCOPIC IMPRESSION:     1) Normal colon     RECOMMENDATIONS:     1) Colonoscopy 10 years           REPEAT EXAM:   10 year(s) Colonoscopy           ______________________________     Barbette Hair. Arlyce Dice, MD           CC:           n.     eSIGNED:   Barbette Hair. Daelon Dunivan at 07/16/2010 02:39 PM           [place], [place], [id number]  Note: An exclamation mark (!) indicates a result that was not dispersed into the flowsheet. Document Creation Date: 07/16/2010 2:39 PM _______________________________________________________________________  (1)  Order result status: Final Collection or observation date-time: 07/16/2010 14:34 Requested date-time:  Receipt date-time:  Reported date-time:  Referring Physician:   Ordering Physician: Melvia Heaps [phone]) Specimen Source:  Source: Launa Grill Order Number: [id number] Lab site:   Appended Document: Colonoscopy    Clinical Lists Changes  Observations: Added new observation of COLONNXTDUE: 06/2020 (07/17/2010 8:09)

## 2010-08-05 LAB — URINALYSIS, ROUTINE W REFLEX MICROSCOPIC
Bilirubin Urine: NEGATIVE
Ketones, ur: NEGATIVE mg/dL
Nitrite: POSITIVE — AB
Protein, ur: NEGATIVE mg/dL
Specific Gravity, Urine: 1.01 (ref 1.005–1.030)
Urobilinogen, UA: 0.2 mg/dL (ref 0.0–1.0)

## 2010-08-05 LAB — URINE CULTURE
Colony Count: >=100000
Culture  Setup Time: 201112231951

## 2010-08-05 LAB — URINE MICROSCOPIC-ADD ON

## 2010-10-11 NOTE — Op Note (Signed)
NAMERIELY, OETKEN             ACCOUNT NO.:  [id number]   MEDICAL RECORD NO.:  [id number]          PATIENT TYPE:  INP   LOCATION:  3006                         FACILITY:  [facility]   PHYSICIAN:  Hilda Lias, M.D.   DATE OF BIRTH:  May 31, 1940   DATE OF PROCEDURE:  09/24/2005  DATE OF DISCHARGE:                                 OPERATIVE REPORT   PREOPERATIVE DIAGNOSIS:  1.  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  Chronic radiculopathy.   POSTOPERATIVE DIAGNOSIS:  1.  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  Chronic radiculopathy.   OPERATION PERFORMED:  1.  Anterior cervical 4-5, 5-6 and 6-7 spinal cord decompression.  2.  Diskectomy.  3.  Interbody fusion with allograft and autograft plate.   SURGEON:  Hilda Lias, M.D.   ASSISTANT:  Stefani Dama, M.D.   CLINICAL HISTORY:  This patient was admitted because of neck pain radiating  to both upper extremities.  The patient had been diagnosed with multiple  sclerosis.  X-ray showed spondylosis with foraminal stenosis at the above  three levels.  The patient wanted to proceed with surgery.   DESCRIPTION OF THE OPERATION:  The patient was brought to the operating room  and after intubation the left side of the neck was prepped with DuraPrep.  A  transverse incision was made through the skin, subcutaneous tissue and down  to the cervical spine.  X-ray showed that we were at the L4-5 level.  We  brought the microscope into the area.  Indeed the area between 4-5 was quite  narrow and ligament was calcified. We had to use the drill to get into the  disk space.  We opened the disk space and indeed it was completely  degenerated.  Decompression of the posterior ligament was made.  The  posterior ligament was opened and decompression of the spinal cord as well  as the foramen was achieved.  The same procedure was done at the level of 5-  6, which had the same findings at the level of 4-5.  The level of 6-7 was  found to have spondylosis mostly on the right side.  Decompression of the  spinal cord was also achieved.   The end we had good decompression of the spinal cord at these three levels  and good decompression of the C5-6 and C6-7 nerve roots.  Then the endplates  were drilled.  Three grafts - lordotic, 7 mm with autograft inside was sent  followed by a plate using four end screws.  __________  spine showed good  position of the bone graft and the plate.  At this time the area was irrigated.  After waiting five minutes to make  sure we had good hemostasis.  Having done this the area was closed with  Vicryl and Steri-strips.   The patient did well.           ______________________________  Hilda Lias, M.D.     EB/MEDQ  D:  09/24/2005  T:  09/25/2005  Job:  [id number]

## 2010-10-11 NOTE — H&P (Signed)
NAMEMARICIA, Sherri [name]                       ACCOUNT NO.:  [id number]   MEDICAL RECORD NO.:  [id number]                   PATIENT TYPE:  INP   LOCATION:  NA                                   FACILITY:  [facility]   PHYSICIAN:  Hilda Lias, M.D.                DATE OF BIRTH:  08/18/1940   DATE OF ADMISSION:  04/05/2002  DATE OF DISCHARGE:                                HISTORY & PHYSICAL   HISTORY OF PRESENT ILLNESS:  This patient is a lady who in the past  underwent L4-5 diskectomy in 1998.  Since then, she had different insurance  and she was seen by another neurosurgeon.  Nevertheless, she was advised  surgery, but she did better.  Now she is coming back because the back pain  and the left leg is getting worse.  The pain is mostly localized to the  lower back.  It is worse in the morning and late in the afternoon.  It gets  worse with activity.  The pain goes all the way down to the left leg and  down to the foot associated with a tingling sensation.  Sitting and standing  are painful.  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  She tells me that she  is unable to play lately because of the pain.   PAST MEDICAL HISTORY:  1. Diskectomy.  2. Thyroid surgery.  3. Tubal ligation.  4. Hysterectomy.   ALLERGIES:  She is allergic to PENICILLIN.   SOCIAL HISTORY:  The patient drinks socially and does not smoke.   FAMILY HISTORY:  Unremarkable.   REVIEW OF SYMPTOMS:  Positive for sinus headaches, high blood pressure, high  cholesterol, back pain, leg pain, and a skin cancer removed from the leg and  nose.   MEDICATIONS:  She is taking medications for high blood pressure and also for  high cholesterol.   PHYSICAL EXAMINATION:  GENERAL APPEARANCE:  The patient came to my office,  limping from the left leg.  HEENT:  Normal.  NECK:  Normal.  LUNGS:  Clear.  HEART:  Heart sounds normal.  ABDOMEN:  Normal.  EXTREMITIES:  Normal pulses.  NEUROLOGIC:  Mental status normal.  Cranial  nerves normal.  Strength in the  upper extremities is normal.  In the lower extremities, she has weakness of  dorsiflexion and plantar flexion of the left foot.  Sensation:  She  complains of a tingling sensation in the left foot, especially on the top  side.  Reflexes 2+.  Straight leg raising positive bilaterally about 75  degrees.  She had decreased flexibility of the lumbar spine.   RADIOLOGY:  The MRI, as well as the lumbar spine x-rays, show that she has  degenerative disk disease at the level of L4-5 and L5-S1, L5-S1 being the  worse.  The patient has bilateral facet arthropathy at those two levels.  She also has some postoperative changes  at the level of L4-5 with decrease  in the disk space and bilateral foraminal stenosis.   CLINICAL IMPRESSION:  Degenerative disk disease at the level of L4-5 and L5-  S1 with a chronic L5-S1 radiculopathy.   RECOMMENDATIONS:  The patient wants to proceed with surgery.  The procedure  will be a bilateral L4-5 and L5-S1 diskectomy, interval fusion, pedicle  screws, and posterolateral fusion.  The risks were explained to her,  including how the surgery is being done.  The patient declines a second  opinion.  The risks are infection, no improvement whatsoever, damage to the  vessels of the abdomen, damage to the nerve, and failure of the bone graft.                                               Hilda Lias, M.D.    EB/MEDQ  D:  04/05/2002  T:  04/05/2002  Job:  [id number]

## 2010-10-11 NOTE — H&P (Signed)
NAMECHERYLE, Sherri [name]             ACCOUNT NO.:  [id number]   MEDICAL RECORD NO.:  [id number]          PATIENT TYPE:  INP   LOCATION:  NA                           FACILITY:  [facility]   PHYSICIAN:  Hilda Lias, M.D.   DATE OF BIRTH:  03/30/1941   DATE OF ADMISSION:  DATE OF DISCHARGE:                                HISTORY & PHYSICAL   Ms. Kubicki is a lady who in the past underwent lumbar fusion.  She had been  complaining of neck pain with radiation to both upper extremities associated  with weakness and pain.  The patient has a history of multiple sclerosis.  We found by __________ that she has a severe case of degenerative disc  disease at multiple levels, and because of the pain she wanted to proceed  with surgery.   PAST MEDICAL HISTORY:  She had a lumbar fusion by me.  She had thyroid  surgery, tubal ligation, hysterectomy.  She is allergic to PENICILLIN.  The  patient drinks; she does not smoke.   FAMILY HISTORY:  Unremarkable except for a brother who has MS.   PHYSICAL EXAMINATION:  HEENT:  Unremarkable.  NECK:  She is able to flex, but extension and lateralization produces  discomfort.  There is a scar anteriorly from previous surgery.  LUNGS:  Clear.  HEART SOUNDS:  Normal.  ABDOMEN:  Normal.  EXTREMITIES:  Normal pulses.  NEUROLOGIC:  She has weakness of the deltoids and biceps.  She has decreased  flexibility of the cervical spine.  She had numbness which involved both  upper extremities.  She has a scar in the lumbar area.   An MRI shows spondylosis at the levels 4-5, 5-6, 6-7.   CLINICAL IMPRESSION:  Cervical spondylosis with chronic radiculopathy.   RECOMMENDATIONS:  The patient is being admitted for surgery.  The procedure  will be a 4-5, 5-6, 6-7 decompression with fusion.  She knows about the  risks such as infection, CSF leak, worsening of the pain, paralysis, no  improvement whatsoever, need of further surgery.            ______________________________  Hilda Lias, M.D.     EB/MEDQ  D:  09/24/2005  T:  09/24/2005  Job:  [id number]

## 2010-10-11 NOTE — Discharge Summary (Signed)
   NAMESHANEEQUA, Sherri [name]                       ACCOUNT NO.:  [id number]   MEDICAL RECORD NO.:  [id number]                   PATIENT TYPE:  INP   LOCATION:  3028                                 FACILITY:  [facility]   PHYSICIAN:  Hilda Lias, M.D.                DATE OF BIRTH:  1940-10-20   DATE OF ADMISSION:  04/05/2002  DATE OF DISCHARGE:  04/10/2002                                 DISCHARGE SUMMARY   ADMISSION DIAGNOSIS:  Degenerative disk disease L4-L5, L5-S1 with chronic  radiculopathy and facet arthropathy.   FINAL DIAGNOSIS:  Degenerative disk disease L4-L5, L5-S1 with chronic  radiculopathy plus arthropathy.   CLINICAL HISTORY:  The patient is admitted because of back radiation to both  legs which is getting worse. The patient  has failed conservative treatment.  Right now she is having problems ambulating. X-ray showed degenerative disk  disease and facet arthropathy.  The patient will undergo surgery.   LABORATORY DATA:  Within normal limits except the hemoglobin at the time of  discharge is 8.8.   HOSPITAL COURSE:  The patient  was taken surgery and fusion was done.  The  patient  is doing very well right now except that she is having some problem  on the left side.  Nevertheless, there is no secretion, no fever, and she  wants to go home. She is being discharged today to be followed by me.   CONDITION ON DISCHARGE:  Improvement.   MEDICATIONS:  Percocet, Diazepam and Sinutuss.   FOLLOW UP:  I will be seeing her in office in two weeks.   DIET:  Regular.   ACTIVITY:  Not to drive until I see her.  The patient has been seen in the past by Dr. Liana Gerold from the ENT service. If  the pain in the face continues, she will set up an appointment with him.                                               Hilda Lias, M.D.    EB/MEDQ  D:  04/10/2002  T:  04/11/2002  Job:  [id number]

## 2010-10-11 NOTE — Discharge Summary (Signed)
Sherri [name], Sherri [name]                       ACCOUNT NO.:  [id number]   MEDICAL RECORD NO.:  [id number]                   PATIENT TYPE:  INP   LOCATION:  5714                                 FACILITY:  [facility]   PHYSICIAN:  Rene Paci, M.D. [facility]          DATE OF BIRTH:  26-Oct-1940   DATE OF ADMISSION:  06/06/2003  DATE OF DISCHARGE:  06/08/2003                                 DISCHARGE SUMMARY   DISCHARGE DIAGNOSES:  1. Fever.  2. Hypertension.  3. Hyponatremia.   BRIEF ADMISSION HISTORY:  Sherri [name] is a 70 year old white female who  states that she developed fever on January 8.  It was associated with chills  and rigor. She also had generalized arthralgias and myalgias.  She also  described a frontal headache.   PAST MEDICAL HISTORY:  1. Cancer in situ of the cervix at age 38.  2. History of rib fractures.  3. Status post total abdominal hysterectomy and bilateral salpingo-     oophorectomy with incidental appendectomy.  4. Right thyroid lobectomy secondary to cyst.  5. History of sinus surgery.  6. Status post tubal ligation.  7. Lumbosacral surgery in 1998.  8. History of back surgery in 2003.  9. Status post carpal tunnel surgery.  10.      History of stress ulcer.  11.      Fibromyalgia.  12.      Hypertension.  13.      Anxiety.  14.      Lipidemia.  15.      Rheumatoid arthritis.   HOSPITAL COURSE BY PROBLEMS:  Problem 1:  IV.  The patient presented with  fever.  The patient has remained afebrile throughout her hospitalization.  White count was normal with a mild left shift.  Workup here was  unremarkable.  Urinalysis and urine culture was negative.  Blood cultures x2  were negative.  Abdominal ultrasound was normal.  Chest x-ray was without  pneumonia.  CT of the sinuses was also normal.  Her fever may have been  secondary to some bibasilar atelectasis versus a viral upper respiratory  infection which has now resolved.  The patient was started on Avelox and  we  will have her complete a 7-day course empirically.    Problem 2:  HYPOTENSION.  Perhaps secondary to dehydration, volume depletion  and/or antihypertensives.   Problem 3:  HYPONATREMIA.  Her sodium was 127.  We suspect that this was  because of her Lasix, but she also apparently takes some Dyazide which we  have instructed her to hold.   MEDICATIONS AT DISCHARGE:  1. Prednisone 10 mg a day.  2. Imuran 100 mg daily.  3. Lasix 20 mg daily.  4. Altace 10 mg daily.  5. Actonel 35 mg daily.  6. Nexium 40 mg daily.  7. Zocor 40 mg daily.  8. She was instructed not to take her Dyazide.  9. Avelox for 3 more days.  FOLLOWUP:  Followup with Dr. Titus Dubin. Hopper in 1-2 weeks.      Cornell Barman, P.A. [facility]                  Rene Paci, M.D. [facility]    LC/MEDQ  D:  06/08/2003  T:  06/08/2003  Job:  [id number]   cc:   Titus Dubin. Alwyn Ren, M.D. [facility]

## 2010-10-11 NOTE — Op Note (Signed)
Sherri, [name]                       ACCOUNT NO.:  [id number]   MEDICAL RECORD NO.:  [id number]                   PATIENT TYPE:  AMB   LOCATION:  DAY                                  FACILITY:  [facility]   PHYSICIAN:  Sherri [name], M.D.                 DATE OF BIRTH:  1940/07/19   DATE OF PROCEDURE:  02/10/2003  DATE OF DISCHARGE:  02/10/2003                                 OPERATIVE REPORT   PROCEDURES:  Esophagogastroduodenoscopy, followed by total colonoscopy.   INDICATIONS:  Sherri [name] is a 70 year old Caucasian female who presents with  epigastric pain, early satiety, weight loss, and heme-positive stools.  Family is significant for various GI malignancies.   She is undergoing a diagnostic evaluation.  The patient has been on PPI  chronically.  The dose was doubled and her NSAID therapy was discontinued,  but she does not feel any better.  Both the procedures were reviewed with  the patient, and informed consent was obtained.   PREMEDICATION:  Cetacaine spray for pharyngeal topical anesthesia, fentanyl  45 mcg IV, Versed 18 mg IV in divided dose.   FINDINGS:  Procedure performed in endoscopy suite.  The patient's vital  signs and O2 saturation were monitored during the procedure and remained  stable.   Procedure #1, esophagogastroduodenoscopy:  The patient was placed in left  lateral recumbent position and the Olympus video scope was passed via  oropharynx without any difficulty into esophagus.   Esophagus:  Mucosa of the esophagus was normal.  She had a 3 cm long sliding  hiatal hernia.   Stomach:  It was empty and distended very well with insufflation.  Folds of  proximal stomach were normal.  Examination of the mucosa revealed single  small polyp at gastric body, which was ablated by a cold biopsy.  The  pyloric channel was patent.  Angularis, fundus, and cardia were examined by  retroflexing the scope and were normal.   Duodenum:  Examination of the bulb  revealed normal mucosa.  The scope was  passed to the second part of the duodenum, where mucosa and folds were  normal.  Endoscope was withdrawn and the patient prepared for procedure #2.   Total colonoscopy:  Rectal examination performed.  No abnormality noted on  external or digital exam.  The Olympus video scope was placed in the rectum,  advanced under vision in the sigmoid colon and beyond.  Preparation was  excellent except she had stool at the cecum.  Cecal landmarks were well-seen  and photographed for the record.  As the scope was withdrawn the colonic  mucosa was once again carefully examined.  There were no polyps, tumor  masses, or diverticular changes.  The scope was retroflexed to examine the  anorectal junction, and small hemorrhoids were noted below the dentate line.  The endoscope was straightened and withdrawn.  The patient tolerated the  procedures well.  FINAL DIAGNOSES:  1. Small sliding hiatal hernia without endoscopic evidence of reflux     esophagitis.  2. Small gastric polyp, which was ablated by cold biopsy.  No evidence of     peptic ulcer disease.  3. Normal colonoscopy except small external hemorrhoids.   As far as her heme-positive stools are concerned, it is possible that this  is due to prior nonsteroidal anti-inflammatory drug use or small bowel  injury.  Her hemoglobin and hematocrit are normal, and I do not feel further  workup is indicative unless her epigastric pain and weight loss continue.   Please note that her chemistry panel today is within normal limits.  The  patient was given a copy, but she will give it to Dr. Alwyn [name] for his  records.   RECOMMENDATIONS:  1. She will drop her Nexium to 40 mg p.o. q.a.m.  2. Bentyl 10 mg four each meal.  3. Will schedule her for upper abdominal ultrasound with attention to     gallbladder, bile duct, and pancreas.  I will be consulting the patient     with results of the study and further  recommendations.                                               Sherri [name], M.D.    NR/MEDQ  D:  02/11/2003  T:  02/13/2003  Job:  [id number]   cc:   Sherri [name]. Sherri [name], M.D. [facility]
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
## 2010-10-11 NOTE — Op Note (Signed)
Sherri [name], Sherri [name]                       ACCOUNT NO.:  [id number]   MEDICAL RECORD NO.:  [id number]                   PATIENT TYPE:  INP   LOCATION:  NA                                   FACILITY:  [facility]   PHYSICIAN:  Hilda Lias, M.D.                DATE OF BIRTH:  1940/07/11   DATE OF PROCEDURE:  04/05/2002  DATE OF DISCHARGE:                                 OPERATIVE REPORT   PREOPERATIVE DIAGNOSIS:  L4-5, L5-S1 degenerative disk disease with  hypertrophy of the facet, chronic L5-S1 radiculopathy, status post L4-5  diskectomy.   POSTOPERATIVE DIAGNOSIS:  L4-5, L5-S1 degenerative disk disease with  hypertrophy of the facet, chronic L5-S1 radiculopathy, status post L4-5  diskectomy.   OPERATION PERFORMED:  Bilateral L4-5 laminectomy, bilateral 4-5, 5-1  diskectomy, interbody fusion on the left.  Posterolateral fusion L4 to S1,  pedicle screw L5-S1 bilaterally with a transverse connector.  Posterolateral  fusion.  C-arm, Cell-Saver.   SURGEON:  Hilda Lias, M.D.   ASSISTANT:  Clydene Fake, M.D.   ANESTHESIA:   INDICATIONS FOR PROCEDURE:  The patient was admitted because of chronic back  pain with radiation down to both legs, left worse than right one.  I have  been following this lady since 1997 and she is getting worse.  X-ray showed  degenerative disk disease at the level 4-5 and 5-1.  Surgery was advised.  The risks were explained in the history and physical.   DESCRIPTION OF PROCEDURE:  The patient was taken to the operating room and  she was positioned in a prone manner.  The back was prepped with Betadine.  A midline incision from L3 down to S1 was made.  Muscle and fascia were  retracted laterally beyond the facet until we were able to feel the  transverse process of 4 and 5.  Then we tried to do some laminotomies but we  found quite a bit of scar tissue on the left side of her previous surgery.  Because of this, we went ahead and did a bilateral L4  and L5 laminectomy.  The patient had quite a bit of adhesion and lysis was accomplished on the  left side.  With the drill, we went ahead and removed the L4 and L5-S1  facet.  We identified the L4 and L5 nerve root.  On the right side there was  stenosis but on the left side not only did she have stenosis but quite a bit  of scar tissue.  Lysis was achieved with plenty of space for the nerve root.  Then we identified the L4-L5 disk.  Bilaterally we did a total gross  diskectomy.  The same procedure was done at the level of L5-S1.  Having done  this with the help of C-arm, we tried to protect the disk space using the  curet to remove the end plate.  At the level of L4-L5, we  attempted to use  of two pieces of bone graft allograft, of 12 x 26 and at the level of L5-S1  the size was 10 x 26.  Using the patient's autograft, we filled out the  space between the bone graft as well as laterally at those two levels .  Then with the C-arm we were able to identify the pedicle of L5, L4 and S1.  Using the pedicle probe and _________ tap we were able to introduce 5.5 x 40  mm screws at those three levels bilaterally.  This was done under  visualization with the C-arm.  AP and lateral x-ray showed good position of  the pedicle screws as well as the bone graft.  Then we went laterally and we  drilled the ala of the sacrum and the transverse process of 5 and 4.  Using  Vitoss plus the patient's autograft, we filled up the lateral canal with  bone.  Then we put a rod from L4  to S1 bilaterally and it was tied with screws.  Then transverse collector  from left to right was inserted to preliminary translation of the fusion.  Having done this, the area was irrigated.  Valsalva maneuver was negative.  The wound was closed.  The area was irrigated and the wound was closed with  Vicryl and Steri-Strips.  The patient did well.                                                 Hilda Lias, M.D.    EB/MEDQ   D:  04/05/2002  T:  04/05/2002  Job:  [id number]

## 2010-10-11 NOTE — Op Note (Signed)
Sherri [name], Sherri [name]             ACCOUNT NO.:  [id number]   MEDICAL RECORD NO.:  [id number]          PATIENT TYPE:  AMB   LOCATION:  DAY                           FACILITY:  [facility]   PHYSICIAN:  Lionel December, M.D.    DATE OF BIRTH:  08/01/1940   DATE OF PROCEDURE:  09/11/2005  DATE OF DISCHARGE:                                 OPERATIVE REPORT   PROCEDURE:  Esophagogastroduodenoscopy followed by colonoscopy.   INDICATIONS:  The patient is a 70 year old Caucasian female with chronic  GERD whose symptoms are well-controlled with therapy who has multiple heme-  positive stools, although her H&H is normal.  Family history is significant  for a gastric carcinoma in sibling with died of it and colon carcinoma in  second-degree relative.  She is on Fosamax and Relafen and therefore could  have peptic ulcer disease or injury to any part of the GI tract.  However,  need to make sure she does not have occult neoplasm.  Her last exam was in  September 2004.   Procedure and risks were reviewed with the patient, and informed consent was  obtained.   MEDICATIONS FOR CONSCIOUS SEDATION:  Benzocaine spray for pharyngeal topical  anesthesia.  Fentanyl 75 mcg q.d. and Versed 14 mg q.d.   FINDINGS:  Procedure performed in endoscopy suite.  The patient's vital  signs and O2 saturation were monitored during the procedure and remained  stable.   PROCEDURE #1:  Esophagogastroduodenoscopy.  The patient was placed in left  lateral position.  Olympus videoscope was passed via oropharynx without any  difficulty into esophagus.   Esophagus.  Mucosa of the esophagus was normal.  The GE junction was  serrated/wavy.  This area had been previously biopsied and was negative for  Barrett's.   Stomach. It was empty and distended very well with insufflation.  Folds of  proximal stomach were normal.  Examination of mucosa at body, antrum,  pyloric channel as well as angularis, fundus and cardia was normal.   Duodenum. Bulbar mucosa was normal.  Scope was passed into second and third  part of the duodenum where mucosa and folds were normal.  Endoscope was  withdrawn.  The patient prepared for procedure #2.   PROCEDURE #2:  Colonoscopy.  Rectal examination performed.  No abnormality  noted on external or digital exam.  Olympus videoscope was placed in rectum  and advanced under vision into sigmoid colon and beyond.  Preparation was  excellent.  Scope was passed into cecum which was identified by ileocecal  valve and appendiceal stump.  Pictures taken for the record.  As the scope  was withdrawn, colonic mucosa was examined for the second time, and there  were no AV malformations, polyps, mass or diverticular changes.  Rectal  mucosa was normal.  Scope was retroflexed to examine anorectal junction, and  small hemorrhoids were noted below the dentate line.  Endoscope was  straightened and withdrawn.  The patient tolerated the procedures well.   FINAL DIAGNOSIS:  1.  Normal esophagogastroduodenoscopy.  2.  Normal colonoscopy except small external hemorrhoids.   RECOMMENDATIONS:  We will  continue to monitor her H&H.  She is to have one  prior to neck surgery which is planned in near future, and if that is  normal, she have another one in six months.  If there is evidence of obvious  bleed or drop in her H&H, we would consider small bowel Given capsule study.      Lionel December, M.D.  Electronically Signed     NR/MEDQ  D:  09/11/2005  T:  09/11/2005  Job:  [id number]   cc:   Titus Dubin. Alwyn Ren, M.D. [facility]  [phone] [place]  [place] [address]

## 2010-10-11 NOTE — H&P (Signed)
NAMELANITA, STAMMEN                       ACCOUNT NO.:  [id number]   MEDICAL RECORD NO.:  [id number]                   PATIENT TYPE:  OIB   LOCATION:  2872                                 FACILITY:  [facility]   PHYSICIAN:  Lionel December, M.D.                 DATE OF BIRTH:  Nov 11, 1940   DATE OF ADMISSION:  01/10/2003  DATE OF DISCHARGE:  01/10/2003                                HISTORY & PHYSICAL   PRESENTING COMPLAINT:  Epigastric pain, weight loss and positive Hemoccults.   HISTORY OF PRESENT ILLNESS:  The patient is a 70 year old Caucasian female  who is here for a scheduled visit.  She has not felt well for the last few  months.  She has been gradually losing weight.  About nine months ago she  was 140 pounds; she has lost about 21 pounds.  Initially she thought she was  losing weight because of a low carb diet, but she stopped it after two  months but it did not make any difference.  She also complains of epigastric  pain which is described as a dull, aching pain which is more pronounced  every time she eats.  She also has frequent burping spells as well as early  satiety.  The patient states that she has a good appetite, but her daughter  says that she does not eat well.  About two weeks ago she had Hemoccults and  three out of three were positive.  The patient had been on Celebrex 200 mg  b.i.d. since April and this was discontinued about two weeks ago.  She has  also been on a gram of vitamin C daily.  She denies heartburn, nausea,  vomiting, dysphagia, hoarseness or a chronic cough.  She has lately been  having two to three loose stools per day.  She wonders if this is because of  a higher dose of Nexium which was just done recently.  However, she has not  had any melena or rectal bleeding.   REVIEW OF SYMPTOMS:  GENERAL:  Negative for fevers, chills or night sweats.  SOCIAL:  The patient's daughter is concerned that she may be drinking too  much alcohol.  She states she  drinks two to three drinks per day.  GI: She  has not noted any improvement in her epigastric pain since she has been on  double dose Nexium 40 mg b.i.d.   MEDICATIONS:  1. Calcium with vitamin D 1.2 grams daily.  2. Lasix 20 mg q.a.m.  3. Altace 10 mg daily.  4. Colchicine 0.6 mg b.i.d.  5. Zocor 40 mg daily.  6. Ultracet 100 mg q.h.s.   PAST MEDICAL HISTORY:  1. Hypertension.  2. Hyperlipidemia.  3. Back pain.  4. Gout.  5. Osteoporosis. Lumbar spine surgery in April 1990 for disk prolapse and     had lumbar spine fusion from L3 to L5 in November of 2003.  Regarding her     osteoporosis she was on Actonel for two years and a bone density actually     worsened.  Her physician now is thinking of starting her on __________     parathormone after an SGR evaluation is completed.  6. Esophagogastroduodenoscopy and colonoscopy in August 1999 because of     chronic GERD and screening exam.  She had a small sliding hiatal hernia.     Her colonoscopy was normal except for external hemorrhoids.  Her last     upper abdominal ultrasound was in March 2001 which was within normal     limits.  Her CLO test in the past has been negative.  7. Right thyroid lobectomy for a nodule which was benign.  8. Status post hysterectomy with bilateral salpingo-oophorectomy.  9. Right carpal tunnel decompression last month.  10.      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   ALLERGIES:  1. PENICILLIN.  2. DEMEROL.   FAMILY HISTORY:  Father died of GI hemorrhage at age 64 and was suspected to  have carcinoma; however, this diagnosis was not confirmed. Mother died of  pancreatic carcinoma at age 32.  Two maternal aunts also died of malignancy;  one of colon carcinoma and another one of liver carcinoma which possibly was  metastatic.  She lost one sister of gastric carcinoma at age 69.   SOCIAL HISTORY:  She is  retired.  She is married.  She has two children and  one of her daughters is not in.  She does not smoke cigarettes.  She drinks  alcohol two to three drinks per day.   PHYSICAL EXAMINATION:  GENERAL:  This is a well developed, thin Caucasian  female who is in no acute distress.  VITAL SIGNS:  Weight is 119 pounds, 5'3 tall, pulse 74 per minute, blood  pressure 100/60, temperature 96.8.  HEENT:  Conjunctivae are pink. Sclerae is non-icteric.  Oropharyngeal mucosa  is normal.  The neck is without masses or thyromegaly.  CARDIAC EXAM:  Regular rhythm, normal S1 and S2.  No murmur or gallop noted.  LUNGS:  Clear to auscultation.  ABDOMEN:  Symmetrical bowel sounds and normal.  No bruits are noted.  Palpation reveals a soft abdomen with mild to moderate mid epigastric  tenderness but no organomegaly or masses are noted.  RECTAL EXAM:  Deferred.  EXTREMITIES:  She does not have peripheral edema or clubbing.   LABORATORY DATA:  CBC from February 02, 2003:  WBC 6.4, hemoglobin 12.9,  hematocrit 38.2, platelet count 281,000.  Serum amylase is 97 and lipase is  27 from the same date.   ASSESSMENT:  The patient is a 70 year old Caucasian female who presents with  a 20 pound weight loss over the last several weeks, epigastric pain, early  satiety and heme positive stools.  Her last esophagogastroduodenoscopy and  colonoscopy were five years ago without significant findings.  Family  history is significant for various malignancies.  Given her symptoms, it is  likely that we are dealing with peptic ulcer disease since she had been on a  moderate dose of Celebrex until a few weeks ago.  However, one would expect  that she should be feeling better by now.  With the family history of  gastric carcinoma, we need to make sure she does not have any malignancy  although the risks would  be very small.  Her daughter is concerned about her alcohol intake.  She feels that this is  excessive and this  certainly could explain some of her gastrointestinal  symptoms.   RECOMMENDATIONS:  1. An SMA-12 will be checked when she gets to the hospital for endoscopic     evaluation.  2. Esophagogastroduodenoscopy followed by colonoscopy in the near future.  3.     For now she can use Nexium at 40 mg b.i.d.  4. I will ask the patient to try to drink one or maybe two drinks of alcohol     per day and no more.  5. I have reviewed the procedures with the patient and she is agreeable.                                               Lionel December, M.D.    NR/MEDQ  D:  02/07/2003  T:  02/07/2003  Job:  [id number]   cc:   Titus Dubin. Alwyn Ren, M.D. [facility]

## 2010-10-11 NOTE — Op Note (Signed)
   NAMELUCAS, Sherri [name]                       ACCOUNT NO.:  [id number]   MEDICAL RECORD NO.:  [id number]                   PATIENT TYPE:  OIB   LOCATION:  2872                                 FACILITY:  [facility]   PHYSICIAN:  Hilda Lias, M.D.                DATE OF BIRTH:  [birth year]   DATE OF PROCEDURE:  01/10/2003  DATE OF DISCHARGE:                                 OPERATIVE REPORT   PREOPERATIVE DIAGNOSIS:  Right carpal tunnel syndrome.   POSTOPERATIVE DIAGNOSIS:  Right carpal tunnel syndrome.   PROCEDURE:  Decompression of the right median nerve.   SURGEON:  Hilda Lias, M.D.   CLINICAL HISTORY:  The patient is a patient of mine who in the past  underwent cervical and lumbar fusion.  Now, she is complaining of pain in  the right hand associated with weakness in the thenar muscles.  Electrodiagnostic studies is positive for right carpal tunnel syndrome.  The  patient wanted to proceed with decompression.  The risks were explained  including infection, bleeding, no improvement, need for further surgery  because of scar tissue.   PROCEDURE:  The patient was taken to the OR and after IV sedation, the right  hand was prepped with Betadine.  Infiltration along the base of the thumb  was made with Xylocaine.  An incision was made following the base of the  thumb through the skin, volar ligament, revealing a thick carpal ligament.  The ligament was calcified.  Decompression medial and proximal along the  ulnar aspect of the nerve was done.  At the end, we had decompression of the  ligament, the patient was able to move her fingers including her thumb.  Good hemostasis was done with bipolar and the wound was closed with nylon.  The patient did well.                                               Hilda Lias, M.D.    EB/MEDQ  D:  01/10/2003  T:  01/10/2003  Job:  [id number]

## 2011-02-26 ENCOUNTER — Other Ambulatory Visit: Payer: Self-pay | Admitting: Internal Medicine

## 2011-02-26 MED ORDER — RAMIPRIL 5 MG PO CAPS: 5.0000 mg | ORAL_CAPSULE | Freq: Every day | ORAL | Status: DC

## 2011-02-26 NOTE — Telephone Encounter (Signed)
RX sent

## 2011-04-07 ENCOUNTER — Ambulatory Visit (INDEPENDENT_AMBULATORY_CARE_PROVIDER_SITE_OTHER): Payer: Medicare Other | Admitting: Internal Medicine

## 2011-04-07 ENCOUNTER — Encounter: Payer: Self-pay | Admitting: Internal Medicine

## 2011-04-07 DIAGNOSIS — R1032 Left lower quadrant pain: Secondary | ICD-10-CM

## 2011-04-07 DIAGNOSIS — I10 Essential (primary) hypertension: Secondary | ICD-10-CM

## 2011-04-07 MED ORDER — RAMIPRIL 5 MG PO CAPS: 5.0000 mg | ORAL_CAPSULE | Freq: Every day | ORAL | Status: DC

## 2011-04-07 MED ORDER — FUROSEMIDE 20 MG PO TABS: 20.0000 mg | ORAL_TABLET | Freq: Every day | ORAL | Status: DC

## 2011-04-07 NOTE — Patient Instructions (Signed)
Blood Pressure Goal  Ideally is an AVERAGE < 135/85. This AVERAGE should be calculated from @ least 5-7 BP readings taken @ different times of day on different days of week. You should not respond to isolated BP readings , but rather the AVERAGE for that week . Please call [facility] to make an appointment with one of the Sports Medicine specialist. An ligamentaous injury is suggested

## 2011-04-07 NOTE — Progress Notes (Signed)
Subjective:    Patient ID: Sherri [name], female    DOB: May 31, 1940, 70 y.o.   MRN: [id number]  HPI ABDOMINAL PAIN: Location: L inguinal area  Onset: since 7/12 Trigger/injury:no   Radiation: no  Severity: up to 8 Quality: dull aching  Duration: constant  Better with: no  Worse with: sharp climbing stairs Symptoms Nausea/Vomiting: no  Diarrhea: no  Constipation: no  Melena/BRBPR: no  Anorexia: no  Fever/Chills: no  Dysuria/ hematuria/pyuria: no, but UTI present 6 months (11/11- 6/12)  treated by Dr Annabell Howells  Rash: no  Wt loss: no  Vaginal bleeding/ discharge: no  Past Surgeries: last colonoscopy 01/12 : negative  Evaluation to date has included plain films of the hip an MRI by her rheumatologist; these were negative. Additionally she's been seen by her neurologist; he states this is not related to her MS. She's also been seen by a neurosurgeon;he states this is not radicular from the spine.This has precluded her dancing or playing golf   CHRONIC HYPERTENSION: Disease Monitoring  Blood pressure range: 108-112/70  Chest pain: no   Dyspnea: no   Claudication: no   Medication compliance: yes  Medication Side Effects  Lightheadedness: no   Urinary frequency: no   Edema: only with new Rheu medication  Preventitive Healthcare:  Diet Pattern: no plan  Salt Restriction: no added salt         Review of Systems     Objective:   Physical Exam Gen.: Thin but healthy and well-nourished in appearance. Alert, appropriate and cooperative throughout exam.  Neck: No deformities, masses, or tenderness noted. Range of motion decreased. Thyroid normal. Lungs: Normal respiratory effort; chest expands symmetrically. Lungs are clear to auscultation without rales, wheezes, or increased work of breathing. Heart: Normal rate and rhythm. Normal S1 and S2. No gallop, click, or rub. S4 w/o murmur. Abdomen: Bowel sounds normal; abdomen soft and nontender. No masses, organomegaly or hernias  noted. No hernia is noted in the inguinal area.                                                                               Musculoskeletal/extremities: She is able to lie back on the exam table and sit up without help No clubbing, cyanosis, edema noted. Range of motion of legs  normal; she has pain with internal rotation of the left hip .Tone & strength  normal.Joints ; minor DIP changes. Nail health  good. Vascular: Carotid, radial artery, dorsalis pedis and  posterior tibial pulses are full and equal. No bruits present. Neurologic: Alert and oriented x3. Deep tendon reflexes symmetrical but decreased @ knees.         Skin: Intact without suspicious lesions or rashes. There is a well-healed operative scar and left lower quadrant; she states this was cosmetic. Lymph: No cervical, axillary, or inguinal lymphadenopathy present. Psych: Mood and affect are normal. Normally interactive  Assessment & Plan:  #1 hypertension; excellent control no change indicated  #2 left inguinal area pain; this appears to be ligamentous rather than related to hernia or degenerative disc disease  Plan: I would recommend reevaluation by one of the Sports Medicine specialist in her Rheumatologist group.

## 2011-04-09 ENCOUNTER — Other Ambulatory Visit (HOSPITAL_COMMUNITY): Payer: Self-pay | Admitting: Orthopedic Surgery

## 2011-04-09 DIAGNOSIS — M25559 Pain in unspecified hip: Secondary | ICD-10-CM

## 2011-04-09 DIAGNOSIS — M659 Synovitis and tenosynovitis, unspecified: Secondary | ICD-10-CM

## 2011-04-21 ENCOUNTER — Ambulatory Visit (HOSPITAL_COMMUNITY): Payer: Medicare Other

## 2011-04-21 ENCOUNTER — Encounter (HOSPITAL_COMMUNITY)
Admission: RE | Admit: 2011-04-21 | Discharge: 2011-04-21 | Disposition: A | Discharge status: Home / Self Care | Payer: Medicare Other | Source: Ambulatory Visit | Attending: Orthopedic Surgery | Admitting: Orthopedic Surgery

## 2011-04-21 DIAGNOSIS — M25559 Pain in unspecified hip: Secondary | ICD-10-CM | POA: Insufficient documentation

## 2011-04-21 DIAGNOSIS — M659 Unspecified synovitis and tenosynovitis, unspecified site: Secondary | ICD-10-CM | POA: Insufficient documentation

## 2011-04-21 MED ORDER — TECHNETIUM TC 99M MEDRONATE IV KIT
25.0000 | PACK | Freq: Once | INTRAVENOUS | Status: AC | PRN
  Administered 2011-04-21: 25 via INTRAVENOUS

## 2011-06-04 ENCOUNTER — Other Ambulatory Visit: Payer: Self-pay | Admitting: Orthopedic Surgery

## 2011-06-04 DIAGNOSIS — M25659 Stiffness of unspecified hip, not elsewhere classified: Secondary | ICD-10-CM

## 2011-06-05 ENCOUNTER — Ambulatory Visit
Admission: RE | Admit: 2011-06-05 | Discharge: 2011-06-05 | Disposition: A | Discharge status: Home / Self Care | Payer: Medicare Other | Source: Ambulatory Visit | Attending: Orthopedic Surgery | Admitting: Orthopedic Surgery

## 2011-06-05 DIAGNOSIS — M25659 Stiffness of unspecified hip, not elsewhere classified: Secondary | ICD-10-CM

## 2011-06-05 MED ORDER — IOHEXOL 180 MG/ML  SOLN
1.0000 mL | Freq: Once | INTRAMUSCULAR | Status: AC | PRN
  Administered 2011-06-05: 1 mL via INTRA_ARTICULAR

## 2011-10-07 ENCOUNTER — Telehealth: Payer: Self-pay | Admitting: Internal Medicine

## 2011-10-07 DIAGNOSIS — Z1231 Encounter for screening mammogram for malignant neoplasm of breast: Secondary | ICD-10-CM

## 2011-10-07 NOTE — Telephone Encounter (Signed)
Spoke with patient, patient states her last Mammogram was 12/23/2010, done at [facility]. Order placed

## 2011-10-07 NOTE — Telephone Encounter (Signed)
needs referral for mammogram, no longer has a GYN as they do not accept her insurance. Can call patient at [phone]

## 2011-10-13 ENCOUNTER — Other Ambulatory Visit: Payer: Self-pay | Admitting: Orthopedic Surgery

## 2011-10-13 DIAGNOSIS — M25552 Pain in left hip: Secondary | ICD-10-CM

## 2011-10-14 ENCOUNTER — Ambulatory Visit
Admission: RE | Admit: 2011-10-14 | Discharge: 2011-10-14 | Disposition: A | Discharge status: Home / Self Care | Payer: Medicare Other | Source: Ambulatory Visit | Attending: Orthopedic Surgery | Admitting: Orthopedic Surgery

## 2011-10-14 DIAGNOSIS — M25552 Pain in left hip: Secondary | ICD-10-CM

## 2011-10-14 MED ORDER — IOHEXOL 180 MG/ML  SOLN
1.0000 mL | Freq: Once | INTRAMUSCULAR | Status: AC | PRN
  Administered 2011-10-14: 1 mL via INTRA_ARTICULAR

## 2011-10-14 MED ORDER — METHYLPREDNISOLONE ACETATE 40 MG/ML INJ SUSP (RADIOLOG
120.0000 mg | Freq: Once | INTRAMUSCULAR | Status: AC
  Administered 2011-10-14: 120 mg via INTRA_ARTICULAR

## 2011-11-10 ENCOUNTER — Ambulatory Visit (INDEPENDENT_AMBULATORY_CARE_PROVIDER_SITE_OTHER): Payer: Medicare Other | Admitting: Internal Medicine

## 2011-11-10 ENCOUNTER — Encounter: Payer: Self-pay | Admitting: Internal Medicine

## 2011-11-10 VITALS — BP 110/62 | HR 73 | Temp 98.0°F | Wt 113.0 lb

## 2011-11-10 DIAGNOSIS — E785 Hyperlipidemia, unspecified: Secondary | ICD-10-CM

## 2011-11-10 DIAGNOSIS — Z1239 Encounter for other screening for malignant neoplasm of breast: Secondary | ICD-10-CM

## 2011-11-10 DIAGNOSIS — G35 Multiple sclerosis: Secondary | ICD-10-CM

## 2011-11-10 DIAGNOSIS — R7309 Other abnormal glucose: Secondary | ICD-10-CM

## 2011-11-10 DIAGNOSIS — I10 Essential (primary) hypertension: Secondary | ICD-10-CM

## 2011-11-10 DIAGNOSIS — Z0000 Encounter for general adult medical examination without abnormal findings: Secondary | ICD-10-CM

## 2011-11-10 NOTE — Progress Notes (Signed)
Subjective:    Patient ID: Sherri [name], female    DOB: [birth year], 71 y.o.   MRN: [id number]  HPI Medicare Wellness Visit:  The following psychosocial & medical history were reviewed as required by Medicare.   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   Home & personal  safety / fall risk: some due to hip & MS, activities of daily living: no limitations , seatbelt use : yes , and smoke alarm employment : yes .  Power of Attorney/Living Will status : in place  Vision ( as recorded per Nurse) & Hearing  evaluation : extensive  Ophth exam  6 mos ago due to MTX therapy.Decreased hearing R ear due to MS Orientation :oriented X 3 , memory & recall :good,  math testing: good,and mood & affect : normal . Depression / anxiety: no Travel history : [place] > 20 years ago   , immunization status :? Tetanus status , transfusion history:  no, and preventive health surveillance ( colonoscopies, BMD , etc as per protocol/ SOC):colonscopy 01/13, Dental care:  Seen every  6 mos . Chart reviewed &  Updated. Active issues reviewed & addressed.       Review of Systems HYPERTENSION: Disease Monitoring: Blood pressure range-110/70 on average  Chest pain, palpitations- no       Dyspnea- no Medications: Compliance- yes  Lightheadedness,Syncope- if arises too quickly    Edema- no  FASTING HYPERGLYCEMIA, PMH of:  Disease Monitoring: Blood Sugar ranges- no monitor  Polyuria/phagia/dipsia- no      Visual problems- no Diet: low carb  HYPERLIPIDEMIA: Disease Monitoring: See symptoms for Hypertension Medications: Compliance- no meds  Abd pain, bowel changes- no   Muscle aches- yes from referred pain          Objective:   Physical Exam Gen.: Healthy and well-nourished in appearance. Alert, appropriate and cooperative throughout exam. Head: Normocephalic without  obvious abnormalities  Eyes: No corneal or conjunctival inflammation noted. Pupils equal round reactive to light and accommodation. Fundal exam is benign without hemorrhages, exudate, papilledema. Extraocular motion intact. Vision grossly normal with lenses. Ears: External  ear exam reveals no significant lesions or deformities. Canals clear .TMs normal. Hearing is grossly decreased on R Nose: External nasal exam reveals no deformity or inflammation. Nasal mucosa are pink and moist. No lesions or exudates noted.  Mouth: Oral mucosa and oropharynx reveal no lesions or exudates. Teeth in good repair. Neck: No deformities, masses, or tenderness noted. Range of motion decreased. Thyroid small. Lungs: Normal respiratory effort; chest expands symmetrically. Lungs are clear to auscultation without rales, wheezes, or increased work of breathing. Heart: Normal rate and rhythm. Normal S1 and S2. No gallop, click, or rub. S 4 w/o  murmur. Abdomen: Bowel sounds normal; abdomen soft and nontender. No masses, organomegaly or hernias noted. Genitalia: to establish with new Gyn  .  Musculoskeletal/extremities: No deformity or scoliosis noted of  the thoracic or lumbar spine. No clubbing, cyanosis, edema, or deformity noted. RA hand joint changes with interosseous muscle loss.. Nail health  good. Vascular: Carotid, radial artery,  and  posterior tibial pulses are full and equal. Decreased dorsalis pedis pulses.No bruits present. Neurologic: Alert and oriented x3. Deep tendon reflexes symmetrical but 0+ @ knees.          Skin: Intact without suspicious lesions or rashes. Lymph: No cervical, axillary lymphadenopathy present. Psych: Mood and affect are normal. Normally interactive                                                                                        Assessment & Plan:  #1 Medicare Wellness Exam; criteria met ; data  entered #2 Problem List reviewed ; Assessment/ Recommendations made Plan: see Orders

## 2011-11-10 NOTE — Patient Instructions (Addendum)
EKG is normal but there are minor ST-T changes of early repolarization. These are normal variants but could be mistaken for acute injury. This EKG should be available for comparison if  seen emergently.  Please  schedule fasting Labs : Lipids,  TSH, A1c. PLEASE BRING THESE INSTRUCTIONS TO FOLLOW UP  LAB APPOINTMENT.This will guarantee correct labs are drawn, eliminating need for repeat blood sampling ( needle sticks ! ). Diagnoses /Codes: 272.4,995.20,790.29  Please try to go on My Chart within  24 hours after blood draw to allow me to release the results directly to you.

## 2011-11-11 ENCOUNTER — Other Ambulatory Visit (INDEPENDENT_AMBULATORY_CARE_PROVIDER_SITE_OTHER): Payer: Medicare Other

## 2011-11-11 DIAGNOSIS — T887XXA Unspecified adverse effect of drug or medicament, initial encounter: Secondary | ICD-10-CM

## 2011-11-11 DIAGNOSIS — R7309 Other abnormal glucose: Secondary | ICD-10-CM

## 2011-11-11 DIAGNOSIS — E785 Hyperlipidemia, unspecified: Secondary | ICD-10-CM

## 2011-11-11 LAB — LIPID PANEL
HDL: 73.6 mg/dL (ref >39.00)
Triglycerides: 71 mg/dL (ref 0.0–149.0)

## 2011-11-11 NOTE — Progress Notes (Signed)
Labs only

## 2011-11-19 ENCOUNTER — Telehealth: Payer: Self-pay | Admitting: *Deleted

## 2011-11-19 NOTE — Telephone Encounter (Signed)
[  Patient requesting lab results; had not signed up for My Chart services, explained again to follow instructions on last AVS w/activation code pre-populated for her & to use IT number given as needed; Pt understood & agreed.] Lab results given & copy mailed as requested/SLS  Patient request to inform MD that she will be having her THR surgery on December 22, 2011.

## 2011-11-25 ENCOUNTER — Other Ambulatory Visit: Payer: Self-pay | Admitting: Orthopedic Surgery

## 2011-12-08 ENCOUNTER — Encounter (HOSPITAL_COMMUNITY): Payer: Self-pay | Admitting: Respiratory Therapy

## 2011-12-16 ENCOUNTER — Encounter (HOSPITAL_COMMUNITY)
Admission: RE | Admit: 2011-12-16 | Discharge: 2011-12-16 | Disposition: A | Discharge status: Home / Self Care | Payer: Medicare Other | Source: Ambulatory Visit | Attending: Orthopedic Surgery | Admitting: Orthopedic Surgery

## 2011-12-16 ENCOUNTER — Encounter (HOSPITAL_COMMUNITY): Payer: Self-pay

## 2011-12-16 HISTORY — DX: Gastro-esophageal reflux disease without esophagitis: K21.9

## 2011-12-16 HISTORY — DX: Malignant (primary) neoplasm, unspecified: C80.1

## 2011-12-16 HISTORY — DX: Myoneural disorder, unspecified: G70.9

## 2011-12-16 LAB — URINALYSIS, ROUTINE W REFLEX MICROSCOPIC
Bilirubin Urine: NEGATIVE
Glucose, UA: NEGATIVE mg/dL
Hgb urine dipstick: NEGATIVE
Ketones, ur: NEGATIVE mg/dL
Protein, ur: NEGATIVE mg/dL

## 2011-12-16 LAB — PROTIME-INR
INR: 0.95 (ref 0.00–1.49)
Prothrombin Time: 12.9 seconds (ref 11.6–15.2)

## 2011-12-16 LAB — CBC
MCHC: 34.2 g/dL (ref 30.0–36.0)
RDW: 14.8 % (ref 11.5–15.5)
WBC: 8 10*3/uL (ref 4.0–10.5)

## 2011-12-16 LAB — BASIC METABOLIC PANEL
BUN: 9 mg/dL (ref 6–23)
Chloride: 97 mEq/L (ref 96–112)
Creatinine, Ser: 0.68 mg/dL (ref 0.50–1.10)
GFR calc Af Amer: >90 mL/min (ref >90)
GFR calc non Af Amer: 87 mL/min — ABNORMAL LOW (ref >90)
Potassium: 4.1 mEq/L (ref 3.5–5.1)

## 2011-12-16 LAB — DIFFERENTIAL
Basophils Absolute: 0 10*3/uL (ref 0.0–0.1)
Basophils Relative: 0 % (ref 0–1)
Lymphocytes Relative: 11 % — ABNORMAL LOW (ref 12–46)
Neutro Abs: 6.8 10*3/uL (ref 1.7–7.7)
Neutrophils Relative %: 85 % — ABNORMAL HIGH (ref 43–77)

## 2011-12-16 LAB — TYPE AND SCREEN
ABO/RH(D): A POS
Antibody Screen: NEGATIVE

## 2011-12-16 LAB — APTT: aPTT: 29 seconds (ref 24–37)

## 2011-12-16 LAB — SURGICAL PCR SCREEN: MRSA, PCR: NEGATIVE

## 2011-12-16 NOTE — Pre-Procedure Instructions (Signed)
[address] [place] [address] [place]  12/16/2011   Your procedure is scheduled on:  12-22-2011  Report to [facility] at 5:30  AM.  Call this number if you have problems the morning of surgery: [phone]   Remember:   Do not eat food or drink:After Midnight. .  .  Take these medicines the morning of surgery with A SIP OF WATER: plaquenil, tramadol as needed   Do not wear jewelry, make-up or nail polish.  Do not wear lotions, powders, or perfumes. You may wear deodorant.  Do not shave 48 hours prior to surgery. Men may shave face and neck.  Do not bring valuables to the hospital.  Contacts, dentures or bridgework may not be worn into surgery.  Leave suitcase in the car. After surgery it may be brought to your room.  For patients admitted to the hospital, checkout time is 11:00 AM the day of discharge.     Special Instructions: Incentive Spirometry - Practice and bring it with you on the day of surgery. and CHG Shower Use Special Wash: 1/2 bottle night before surgery and 1/2 bottle morning of surgery.   Please read over the following fact sheets that you were given: Pain Booklet, Blood Transfusion Information, MRSA Information and Surgical Site Infection Prevention

## 2011-12-16 NOTE — H&P (Signed)
  Subjective: A 71 year old woman who's had extensive conservative treatment for left hip pain with Dr. Charlett Blake since 2009, and Dr. Mancel Bale since 2008.  She does carry a diagnosis of rheumatoid arthritis, but x-rays have shown more traditional cartilage loss in the superior weight-bearing area of the left hip.  She has had 3 intra-articular injections of cortisone with Dr. Alvester Morin, and [facility] imaging over the last year and unfortunately, they have provided at best temporary relief.  She likes to play golf and dance and can do neither, because of her left hip pain which is primarily groin greater than laterally.  She cannot take oral anti-inflammatory drugs.  And overall says she is miserable.  The cane does occasionally wake her at night and limits her walking to only a few blocks.  After extensive consideration she is thinking about having hip replacement.  She is well versed in the risks and benefits of this surgery.  Past Medical Hx: Rheumatoid arthritis for which she takes methotrexate and prednisone and MS which limits her ability to take some of the other autoimmune medications.  She is status post lumbar fusion.  Allergies: Penicillin, Demerol, Neurontin, Arava  Family Hx:  Non-contributory  Social Hx: Denies alcohol or tobacco  ROS: Patient denies dizziness, nausea, fever, chills, vomiting, shortness of breath, chest pain, loss of appetite, or rash.    PHYSICAL EXAM: Awake, alert, and oriented x3.  Extraocular motion is intact.  No use of accessory respiratory muscles for breathing.   Cardiovascular exam reveals a regular rhythm.  Skin is intact without cuts, scrapes, or abrasions. Well-nourished well-developed patient seated on the exam table in no obvious distress no shortness of breath.  The patient has fairly classic pain with internal and external rotation of the left hip, worse supine but also bad in the seated position.  Adduction also reproduces her pain, and she does walk with a  left-sided limp.  X-rays from 2013 are reviewed showing loss of a little over 50% of the articular cartilage to the superior weightbearing dome of the left hip.  There is some sclerosis of the subchondral bone.  Assess: 71 year old woman with osteoarthritic type pain of the left hip who is failed extensive conservative measures over the last 3 years.  Plan: The risks and benefits of hip replacement were discussed with the patient and she desires to proceed.  She can anticipate.  2-4 days in the hospital, she'll be full weightbearing from the get go and she should be able to return to golf and dancing after about 2-3 months.  I will see her back at the time of intervention.

## 2011-12-21 MED ORDER — VANCOMYCIN HCL 1000 MG IV SOLR
1500.0000 mg | INTRAVENOUS | Status: AC
  Administered 2011-12-22: 1500 mg via INTRAVENOUS
  Filled 2011-12-21: qty 1500

## 2011-12-22 ENCOUNTER — Ambulatory Visit (HOSPITAL_COMMUNITY): Payer: Medicare Other | Admitting: Certified Registered"

## 2011-12-22 ENCOUNTER — Encounter (HOSPITAL_COMMUNITY): Payer: Self-pay | Admitting: *Deleted

## 2011-12-22 ENCOUNTER — Inpatient Hospital Stay (HOSPITAL_COMMUNITY)
Admission: RE | Admit: 2011-12-22 | Discharge: 2011-12-24 | DRG: 470 | Disposition: A | Discharge status: Home / Self Care | Payer: Medicare Other | Source: Ambulatory Visit | Attending: Orthopedic Surgery | Admitting: Orthopedic Surgery

## 2011-12-22 ENCOUNTER — Inpatient Hospital Stay (HOSPITAL_COMMUNITY): Payer: Medicare Other

## 2011-12-22 ENCOUNTER — Encounter (HOSPITAL_COMMUNITY)
Admission: RE | Disposition: A | Discharge status: Home / Self Care | Payer: Self-pay | Source: Ambulatory Visit | Attending: Orthopedic Surgery

## 2011-12-22 ENCOUNTER — Encounter (HOSPITAL_COMMUNITY): Payer: Self-pay | Admitting: Certified Registered"

## 2011-12-22 DIAGNOSIS — G35 Multiple sclerosis: Secondary | ICD-10-CM | POA: Diagnosis present

## 2011-12-22 DIAGNOSIS — F411 Generalized anxiety disorder: Secondary | ICD-10-CM | POA: Diagnosis present

## 2011-12-22 DIAGNOSIS — M069 Rheumatoid arthritis, unspecified: Secondary | ICD-10-CM | POA: Diagnosis present

## 2011-12-22 DIAGNOSIS — M169 Osteoarthritis of hip, unspecified: Principal | ICD-10-CM | POA: Diagnosis present

## 2011-12-22 DIAGNOSIS — M1610 Unilateral primary osteoarthritis, unspecified hip: Principal | ICD-10-CM | POA: Diagnosis present

## 2011-12-22 DIAGNOSIS — M1612 Unilateral primary osteoarthritis, left hip: Secondary | ICD-10-CM | POA: Diagnosis present

## 2011-12-22 DIAGNOSIS — I10 Essential (primary) hypertension: Secondary | ICD-10-CM | POA: Diagnosis present

## 2011-12-22 HISTORY — PX: TOTAL HIP ARTHROPLASTY: SHX124

## 2011-12-22 SURGERY — ARTHROPLASTY, HIP, TOTAL,POSTERIOR APPROACH
Anesthesia: General | Site: Hip | Laterality: Left | Wound class: Clean

## 2011-12-22 MED ORDER — CARBOXYMETHYLCELLULOSE SODIUM 0.5 % OP SOLN: 1.0000 [drp] | Freq: Two times a day (BID) | OPHTHALMIC | Status: DC

## 2011-12-22 MED ORDER — DIAZEPAM 5 MG PO TABS
5.0000 mg | ORAL_TABLET | Freq: Every day | ORAL | Status: DC
  Administered 2011-12-22 – 2011-12-23 (×2): 5 mg via ORAL
  Filled 2011-12-22 (×2): qty 1

## 2011-12-22 MED ORDER — FUROSEMIDE 20 MG PO TABS
20.0000 mg | ORAL_TABLET | Freq: Every day | ORAL | Status: DC
  Administered 2011-12-22 – 2011-12-23 (×2): 20 mg via ORAL
  Filled 2011-12-22 (×3): qty 1

## 2011-12-22 MED ORDER — BISACODYL 5 MG PO TBEC: 5.0000 mg | DELAYED_RELEASE_TABLET | Freq: Every day | ORAL | Status: DC | PRN

## 2011-12-22 MED ORDER — LACTATED RINGERS IV SOLN
INTRAVENOUS | Status: DC | PRN
  Administered 2011-12-22 (×2): via INTRAVENOUS

## 2011-12-22 MED ORDER — ACETAMINOPHEN 10 MG/ML IV SOLN
INTRAVENOUS | Status: DC | PRN
  Administered 2011-12-22: 1000 mg via INTRAVENOUS

## 2011-12-22 MED ORDER — OXYCODONE HCL 5 MG PO TABS
5.0000 mg | ORAL_TABLET | ORAL | Status: DC | PRN
  Administered 2011-12-22 – 2011-12-23 (×5): 5 mg via ORAL
  Administered 2011-12-23 – 2011-12-24 (×4): 10 mg via ORAL
  Filled 2011-12-22 (×2): qty 2
  Filled 2011-12-22: qty 1
  Filled 2011-12-22: qty 2
  Filled 2011-12-22 (×3): qty 1
  Filled 2011-12-22: qty 2
  Filled 2011-12-22: qty 1

## 2011-12-22 MED ORDER — TRAMADOL HCL 50 MG PO TABS
50.0000 mg | ORAL_TABLET | Freq: Four times a day (QID) | ORAL | Status: DC | PRN
  Administered 2011-12-22 – 2011-12-23 (×4): 100 mg via ORAL
  Filled 2011-12-22 (×5): qty 2

## 2011-12-22 MED ORDER — ACETAMINOPHEN 10 MG/ML IV SOLN: 1000.0000 mg | Freq: Once | INTRAVENOUS | Status: DC | PRN

## 2011-12-22 MED ORDER — ONDANSETRON HCL 4 MG PO TABS: 4.0000 mg | ORAL_TABLET | Freq: Four times a day (QID) | ORAL | Status: DC | PRN

## 2011-12-22 MED ORDER — METHOCARBAMOL 100 MG/ML IJ SOLN
500.0000 mg | Freq: Four times a day (QID) | INTRAVENOUS | Status: DC | PRN
  Administered 2011-12-22: 500 mg via INTRAVENOUS
  Filled 2011-12-22: qty 5

## 2011-12-22 MED ORDER — DEXTROSE-NACL 5-0.45 % IV SOLN: INTRAVENOUS | Status: DC

## 2011-12-22 MED ORDER — ASPIRIN EC 325 MG PO TBEC
325.0000 mg | DELAYED_RELEASE_TABLET | Freq: Two times a day (BID) | ORAL | Status: DC
  Administered 2011-12-22 – 2011-12-23 (×4): 325 mg via ORAL
  Filled 2011-12-22 (×8): qty 1

## 2011-12-22 MED ORDER — METHOTREXATE SODIUM CHEMO INJECTION 25 MG/ML
25.0000 mg | INTRAMUSCULAR | Status: DC
  Administered 2011-12-23: 25 mg via INTRAMUSCULAR
  Filled 2011-12-22 (×2): qty 1

## 2011-12-22 MED ORDER — HYDROMORPHONE HCL PF 1 MG/ML IJ SOLN
0.2500 mg | INTRAMUSCULAR | Status: DC | PRN
  Administered 2011-12-22 (×4): 0.5 mg via INTRAVENOUS

## 2011-12-22 MED ORDER — HYDROMORPHONE HCL PF 1 MG/ML IJ SOLN
INTRAMUSCULAR | Status: AC
  Administered 2011-12-22: 10:00:00
  Filled 2011-12-22: qty 1

## 2011-12-22 MED ORDER — BUPIVACAINE-EPINEPHRINE 0.5% -1:200000 IJ SOLN
INTRAMUSCULAR | Status: DC | PRN
  Administered 2011-12-22: 8 mL

## 2011-12-22 MED ORDER — BUPIVACAINE-EPINEPHRINE (PF) 0.5% -1:200000 IJ SOLN
INTRAMUSCULAR | Status: AC
  Filled 2011-12-22: qty 10

## 2011-12-22 MED ORDER — KCL IN DEXTROSE-NACL 20-5-0.45 MEQ/L-%-% IV SOLN
INTRAVENOUS | Status: DC
  Administered 2011-12-22: 125 mL/h via INTRAVENOUS
  Administered 2011-12-22 – 2011-12-23 (×2): via INTRAVENOUS
  Filled 2011-12-22 (×9): qty 1000

## 2011-12-22 MED ORDER — HYDROXYCHLOROQUINE SULFATE 200 MG PO TABS
200.0000 mg | ORAL_TABLET | Freq: Every day | ORAL | Status: DC
  Administered 2011-12-23: 200 mg via ORAL
  Filled 2011-12-22 (×3): qty 1

## 2011-12-22 MED ORDER — SUFENTANIL CITRATE 50 MCG/ML IV SOLN
INTRAVENOUS | Status: DC | PRN
  Administered 2011-12-22 (×2): 10 ug via INTRAVENOUS
  Administered 2011-12-22: 20 ug via INTRAVENOUS

## 2011-12-22 MED ORDER — POLYVINYL ALCOHOL 1.4 % OP SOLN
1.0000 [drp] | Freq: Two times a day (BID) | OPHTHALMIC | Status: DC
  Administered 2011-12-22 – 2011-12-23 (×4): 1 [drp] via OPHTHALMIC
  Filled 2011-12-22: qty 15

## 2011-12-22 MED ORDER — METOCLOPRAMIDE HCL 10 MG PO TABS
5.0000 mg | ORAL_TABLET | Freq: Three times a day (TID) | ORAL | Status: DC | PRN
Start: 2011-12-22 — End: 2011-12-24

## 2011-12-22 MED ORDER — PHENOL 1.4 % MT LIQD: 1.0000 | OROMUCOSAL | Status: DC | PRN

## 2011-12-22 MED ORDER — METHOTREXATE CHEMO INJECTION (PF) 1 GM **50 MG/ML**: 25.0000 mg | Freq: Once | INTRAMUSCULAR | Status: DC

## 2011-12-22 MED ORDER — ACETAMINOPHEN 10 MG/ML IV SOLN
1000.0000 mg | Freq: Four times a day (QID) | INTRAVENOUS | Status: AC
  Administered 2011-12-22 – 2011-12-23 (×4): 1000 mg via INTRAVENOUS
  Filled 2011-12-22 (×4): qty 100

## 2011-12-22 MED ORDER — MAGNESIUM HYDROXIDE 400 MG/5ML PO SUSP: 30.0000 mL | Freq: Every day | ORAL | Status: DC | PRN

## 2011-12-22 MED ORDER — ACETAMINOPHEN 650 MG RE SUPP: 650.0000 mg | Freq: Four times a day (QID) | RECTAL | Status: DC | PRN

## 2011-12-22 MED ORDER — ONDANSETRON HCL 4 MG/2ML IJ SOLN: 4.0000 mg | Freq: Four times a day (QID) | INTRAMUSCULAR | Status: DC | PRN

## 2011-12-22 MED ORDER — FLEET ENEMA 7-19 GM/118ML RE ENEM: 1.0000 | ENEMA | Freq: Once | RECTAL | Status: AC | PRN

## 2011-12-22 MED ORDER — PROPOFOL 10 MG/ML IV EMUL
INTRAVENOUS | Status: DC | PRN
  Administered 2011-12-22: 90 mg via INTRAVENOUS

## 2011-12-22 MED ORDER — GLYCOPYRROLATE 0.2 MG/ML IJ SOLN
INTRAMUSCULAR | Status: DC | PRN
  Administered 2011-12-22: .4 mg via INTRAVENOUS

## 2011-12-22 MED ORDER — FOLIC ACID 1 MG PO TABS
1.0000 mg | ORAL_TABLET | Freq: Two times a day (BID) | ORAL | Status: DC
  Administered 2011-12-22 – 2011-12-23 (×4): 1 mg via ORAL
  Filled 2011-12-22 (×6): qty 1

## 2011-12-22 MED ORDER — PREDNISONE 5 MG PO TABS
5.0000 mg | ORAL_TABLET | Freq: Every day | ORAL | Status: DC
  Administered 2011-12-22 – 2011-12-23 (×2): 5 mg via ORAL
  Filled 2011-12-22 (×3): qty 1

## 2011-12-22 MED ORDER — METOCLOPRAMIDE HCL 5 MG/ML IJ SOLN: 5.0000 mg | Freq: Three times a day (TID) | INTRAMUSCULAR | Status: DC | PRN

## 2011-12-22 MED ORDER — ACETAMINOPHEN 10 MG/ML IV SOLN
INTRAVENOUS | Status: AC
  Filled 2011-12-22: qty 100

## 2011-12-22 MED ORDER — HYDROCORTISONE SOD SUCCINATE 100 MG PF FOR IT USE
INTRAMUSCULAR | Status: DC | PRN
  Administered 2011-12-22: 100 mg via INTRATHECAL

## 2011-12-22 MED ORDER — NEOSTIGMINE METHYLSULFATE 1 MG/ML IJ SOLN
INTRAMUSCULAR | Status: DC | PRN
  Administered 2011-12-22: 2.5 mg via INTRAVENOUS

## 2011-12-22 MED ORDER — ONDANSETRON HCL 4 MG/2ML IJ SOLN
INTRAMUSCULAR | Status: DC | PRN
  Administered 2011-12-22: 4 mg via INTRAVENOUS

## 2011-12-22 MED ORDER — LIDOCAINE HCL (CARDIAC) 20 MG/ML IV SOLN
INTRAVENOUS | Status: DC | PRN
  Administered 2011-12-22: 40 mg via INTRAVENOUS

## 2011-12-22 MED ORDER — CHLORHEXIDINE GLUCONATE 4 % EX LIQD: 60.0000 mL | Freq: Once | CUTANEOUS | Status: DC

## 2011-12-22 MED ORDER — ROCURONIUM BROMIDE 100 MG/10ML IV SOLN
INTRAVENOUS | Status: DC | PRN
  Administered 2011-12-22: 30 mg via INTRAVENOUS
  Administered 2011-12-22: 10 mg via INTRAVENOUS

## 2011-12-22 MED ORDER — ACETAMINOPHEN 325 MG PO TABS: 650.0000 mg | ORAL_TABLET | Freq: Four times a day (QID) | ORAL | Status: DC | PRN

## 2011-12-22 MED ORDER — MENTHOL 3 MG MT LOZG
1.0000 | LOZENGE | OROMUCOSAL | Status: DC | PRN
  Administered 2011-12-22: 3 mg via ORAL
  Filled 2011-12-22: qty 9

## 2011-12-22 MED ORDER — DIPHENHYDRAMINE HCL 12.5 MG/5ML PO ELIX: 12.5000 mg | ORAL_SOLUTION | ORAL | Status: DC | PRN

## 2011-12-22 MED ORDER — METHOCARBAMOL 500 MG PO TABS
500.0000 mg | ORAL_TABLET | Freq: Four times a day (QID) | ORAL | Status: DC | PRN
  Administered 2011-12-22 – 2011-12-23 (×2): 500 mg via ORAL
  Filled 2011-12-22 (×3): qty 1

## 2011-12-22 MED ORDER — PHENYLEPHRINE HCL 10 MG/ML IJ SOLN
INTRAMUSCULAR | Status: DC | PRN
  Administered 2011-12-22 (×9): 80 ug via INTRAVENOUS

## 2011-12-22 MED ORDER — RAMIPRIL 5 MG PO CAPS
5.0000 mg | ORAL_CAPSULE | Freq: Every day | ORAL | Status: DC
  Administered 2011-12-23 – 2011-12-24 (×2): 5 mg via ORAL
  Filled 2011-12-22 (×3): qty 1

## 2011-12-22 MED ORDER — KCL IN DEXTROSE-NACL 20-5-0.45 MEQ/L-%-% IV SOLN
INTRAVENOUS | Status: AC
  Filled 2011-12-22: qty 1000

## 2011-12-22 MED ORDER — SODIUM CHLORIDE 0.9 % IR SOLN: Status: DC | PRN

## 2011-12-22 MED ORDER — ALUM & MAG HYDROXIDE-SIMETH 200-200-20 MG/5ML PO SUSP: 30.0000 mL | ORAL | Status: DC | PRN

## 2011-12-22 MED ORDER — ONDANSETRON HCL 4 MG/2ML IJ SOLN: 4.0000 mg | Freq: Once | INTRAMUSCULAR | Status: DC | PRN

## 2011-12-22 MED ORDER — HYDROMORPHONE HCL PF 1 MG/ML IJ SOLN
0.5000 mg | INTRAMUSCULAR | Status: DC | PRN
  Filled 2011-12-22: qty 1

## 2011-12-22 MED ORDER — MIDAZOLAM HCL 5 MG/5ML IJ SOLN
INTRAMUSCULAR | Status: DC | PRN
  Administered 2011-12-22: 2 mg via INTRAVENOUS

## 2011-12-22 SURGICAL SUPPLY — 58 items
BLADE SAW SAG 73X25 THK (BLADE) ×1
BLADE SAW SGTL 18X1.27X75 (BLADE) IMPLANT
BLADE SAW SGTL 73X25 THK (BLADE) ×1 IMPLANT
BLADE SAW SGTL MED 73X18.5 STR (BLADE) IMPLANT
BRUSH FEMORAL CANAL (MISCELLANEOUS) IMPLANT
CLOTH BEACON ORANGE TIMEOUT ST (SAFETY) ×2 IMPLANT
COVER BACK TABLE 24X17X13 BIG (DRAPES) ×1 IMPLANT
COVER SURGICAL LIGHT HANDLE (MISCELLANEOUS) ×3 IMPLANT
DRAPE ORTHO SPLIT 77X108 STRL (DRAPES) ×2
DRAPE PROXIMA HALF (DRAPES) ×2 IMPLANT
DRAPE SURG ORHT 6 SPLT 77X108 (DRAPES) ×1 IMPLANT
DRAPE U-SHAPE 47X51 STRL (DRAPES) ×2 IMPLANT
DRILL BIT 7/64X5 (BIT) ×2 IMPLANT
DRSG MEPILEX BORDER 4X12 (GAUZE/BANDAGES/DRESSINGS) ×1 IMPLANT
DRSG MEPILEX BORDER 4X8 (GAUZE/BANDAGES/DRESSINGS) ×2 IMPLANT
DURAPREP 26ML APPLICATOR (WOUND CARE) ×2 IMPLANT
ELECT BLADE 4.0 EZ CLEAN MEGAD (MISCELLANEOUS) ×2
ELECT REM PT RETURN 9FT ADLT (ELECTROSURGICAL) ×2
ELECTRODE BLDE 4.0 EZ CLN MEGD (MISCELLANEOUS) IMPLANT
ELECTRODE REM PT RTRN 9FT ADLT (ELECTROSURGICAL) ×1 IMPLANT
FACESHIELD STD STERILE (MASK) ×1 IMPLANT
GAUZE XEROFORM 1X8 LF (GAUZE/BANDAGES/DRESSINGS) ×2 IMPLANT
GLOVE BIO SURGEON STRL SZ7 (GLOVE) ×2 IMPLANT
GLOVE BIO SURGEON STRL SZ7.5 (GLOVE) ×2 IMPLANT
GLOVE BIO SURGEON STRL SZ8.5 (GLOVE) ×1 IMPLANT
GLOVE BIOGEL PI IND STRL 7.0 (GLOVE) ×1 IMPLANT
GLOVE BIOGEL PI IND STRL 8 (GLOVE) ×1 IMPLANT
GLOVE BIOGEL PI INDICATOR 7.0 (GLOVE) ×1
GLOVE BIOGEL PI INDICATOR 8 (GLOVE) ×1
GLOVE SURG SS PI 8.5 STRL IVOR (GLOVE) ×2
GLOVE SURG SS PI 8.5 STRL STRW (GLOVE) IMPLANT
GOWN PREVENTION PLUS XLARGE (GOWN DISPOSABLE) ×2 IMPLANT
GOWN STRL NON-REIN LRG LVL3 (GOWN DISPOSABLE) ×3 IMPLANT
GOWN STRL REIN 2XL LVL4 (GOWN DISPOSABLE) ×2 IMPLANT
HANDPIECE INTERPULSE COAX TIP (DISPOSABLE)
HOOD PEEL AWAY FACE SHEILD DIS (HOOD) ×4 IMPLANT
KIT BASIN OR (CUSTOM PROCEDURE TRAY) ×2 IMPLANT
KIT ROOM TURNOVER OR (KITS) ×2 IMPLANT
MANIFOLD NEPTUNE II (INSTRUMENTS) ×2 IMPLANT
NEEDLE 22X1 1/2 (OR ONLY) (NEEDLE) ×2 IMPLANT
NS IRRIG 1000ML POUR BTL (IV SOLUTION) ×2 IMPLANT
PACK TOTAL JOINT (CUSTOM PROCEDURE TRAY) ×2 IMPLANT
PAD ARMBOARD 7.5X6 YLW CONV (MISCELLANEOUS) ×4 IMPLANT
PASSER SUT SWANSON 36MM LOOP (INSTRUMENTS) ×2 IMPLANT
PRESSURIZER FEMORAL UNIV (MISCELLANEOUS) IMPLANT
SET HNDPC FAN SPRY TIP SCT (DISPOSABLE) IMPLANT
SUT ETHIBOND 2 V 37 (SUTURE) ×2 IMPLANT
SUT ETHILON 3 0 FSL (SUTURE) ×2 IMPLANT
SUT VIC AB 0 CTB1 27 (SUTURE) ×2 IMPLANT
SUT VIC AB 1 CTX 36 (SUTURE) ×2
SUT VIC AB 1 CTX36XBRD ANBCTR (SUTURE) ×1 IMPLANT
SUT VIC AB 2-0 CTB1 (SUTURE) ×2 IMPLANT
SYR CONTROL 10ML LL (SYRINGE) ×2 IMPLANT
TOWEL OR 17X24 6PK STRL BLUE (TOWEL DISPOSABLE) ×2 IMPLANT
TOWEL OR 17X26 10 PK STRL BLUE (TOWEL DISPOSABLE) ×2 IMPLANT
TOWER CARTRIDGE SMART MIX (DISPOSABLE) IMPLANT
TRAY FOLEY CATH 14FR (SET/KITS/TRAYS/PACK) ×1 IMPLANT
WATER STERILE IRR 1000ML POUR (IV SOLUTION) ×6 IMPLANT

## 2011-12-22 NOTE — Op Note (Signed)
OPERATIVE REPORT    DATE OF PROCEDURE:  12/22/2011       PREOPERATIVE DIAGNOSIS:  OSTEOARTHRITIS LEFT HIP                                                          POSTOPERATIVE DIAGNOSIS:  OSTEOARTHRITIS LEFT HIP                                                           PROCEDURE:  L total hip arthroplasty using a 48 mm DePuy Pinnacle  Cup, [employer], 10-degree polyethylene liner index superior  and posterior, a +0 32 mm ceramic head, a [phone] SROM stem, 20Dsm Cone   SURGEON: Keely Drennan J    ASSISTANT:   Mauricia Area, PA-C  (present throughout entire procedure and necessary for timely completion of the procedure)   ANESTHESIA: General BLOOD LOSS: 300 FLUID REPLACEMENT: 1400 crystalloid DRAINS: Foley Catheter URINE OUTPUT: 300cc COMPLICATIONS: none    INDICATIONS FOR PROCEDURE: A 71 y.o. year-old With  OSTEOARTHRITIS LEFT HIP   for 2 years, x-rays show nearbone-on-bone arthritic changes. Despite conservative measures with observation, anti-inflammatory medicine, narcotics, use of a cane, and intra-articular cortisone injections x2 provided temporary leave for a week or 2 , she has severe unremitting pain and can ambulate only a few blocks before resting. The pain wakes her up at night and prevents her from doing chores. In addition, she has rheumatoid arthritis and is maximally managed with prednisone methotrexate and is seen in consultation from her rheumatologist. Patient desires elective L total hip arthroplasty to decrease pain and increase function. The risks, benefits, and alternatives were discussed at length including but not limited to the risks of infection, bleeding, nerve injury, stiffness, blood clots, the need for revision surgery, cardiopulmonary complications, among others, and they were willing to proceed.y have been discussed. Questions answered.     PROCEDURE IN DETAIL: The patient was identified by armband,  received preoperative IV antibiotics in  the holding area at [facility], taken to the operating room , appropriate anesthetic monitors  were attached and general endotracheal anesthesia induced. Foley catheter was inserted. She was rolled into the R lateral decubitus position and fixed there with a Stulberg Mark II pelvic clamp and the L lower extremity was then prepped and draped  in the usual sterile fashion from the ankle to the hemipelvis. A time-out  procedure was performed. The skin along the lateral hip and thigh  infiltrated with 10 mL of 0.5% Marcaine and epinephrine solution. We  then made a posterolateral approach to the hip. With a #10 blade, 12 cm  incision through skin and subcutaneous tissue down to the level of the  IT band. Small bleeders were identified and cauterized. IT band cut in  line with skin incision exposing the greater trochanter. A Cobra retractor was placed between the gluteus minimus and the superior hip joint capsule, and a spiked Cobra between the quadratus femoris and the inferior hip joint capsule. This isolated the short  external rotators and piriformis tendons. These were tagged with a #2 Ethibond  suture and cut off their  insertion on the intertrochanteric crest. The posterior  capsule was then developed into an acetabular-based flap from Posterior Superior off of the acetabulum out over the femoral neck and back posterior inferior to the acetabular rim. This flap was tagged with two #2 Ethibond sutures and retracted protecting the sciatic nerve. This exposed the arthritic femoral head and osteophytes. The hip was then flexed and internally rotated, dislocating the femoral head and a standard neck cut performed 1 fingerbreadth above the lesser trochanter.  A spiked Cobra was placed in the cotyloid notch and a Hohmann retractor was then used to lever the femur anteriorly off of the anterior pelvic column. A posterior-inferior wing retractor was placed at the junction of the acetabulum and the  ischium completing the acetabular exposure.We then removed the peripheral osteophytes and labrum from the acetabulum. We then reamed the acetabulum up to 47 mm with basket reamers obtaining good coverage in all quadrants, irrigated out with normal  saline solution and hammered into place a 48 mm pinnacle cup in 45  degrees of abduction and about 20 degrees of anteversion. More  peripheral osteophytes removed and a trial 10-degree liner placed with the  index superior-posterior. The hip was then flexed and internally rotated exposing the  proximal femur, which was entered with the initiating reamer followed by  the axial reamers up to a 15.5 mm full depth and 16mm partial depth. We then conically reamed to 20D to the correct depth for a 36 base neck. The calcar was milled to 20Dsm. A trial cone and stem was inserted in the 25 degrees anteversion, with a +0 32mm trial head. Trial reduction was then performed and excellent stability was noted with at 90 of flexion with 70 of internal rotation and then full extension with maximal external rotation. The hip could not be dislocated in full extension. The knee could easily flex  to about 130 degrees. We also stretched the abductors at this point,  because of the preexisting adductor contractures. All trial components  were then removed. The acetabulum was irrigated out with normal saline  solution. A titanium [facility] was then screwed into place  followed by a 10-degree polyethylene liner index superior-posterior. On  the femoral side a 20Dsm ZTT1 cone was hammered into place, followed by a [phone] SROM stem in 25 degrees of anteversion. At this point, a +0 36 mm ceramic head was  hammered on the stem. The hip was reduced. We checked our stability  one more time and found to be excellent. The wound was once again  thoroughly irrigated out with normal saline solution pulse lavage. The  capsular flap and short external rotators were repaired  back to the  intertrochanteric crest through drill holes with a #2 Ethibond suture.  The IT band was closed with running 1 Vicryl suture. The subcutaneous  tissue with 0 and 2-0 undyed Vicryl suture and the skin with running  interlocking 3-0 nylon suture. Dressing of Xeroform and Mepilex was  then applied. The patient was then unclamped, rolled supine, awaken extubated and taken to recovery room without difficulty in stable condition.   Harlo Jaso J 12/22/2011, 8:50 AM

## 2011-12-22 NOTE — Anesthesia Postprocedure Evaluation (Signed)
  Anesthesia Post-op Note  Patient: Sherri [name]  Procedure(s) Performed: Procedure(s) (LRB): TOTAL HIP ARTHROPLASTY (Left)  Patient Location: PACU  Anesthesia Type: General  Level of Consciousness: awake, alert  and oriented  Airway and Oxygen Therapy: Patient Spontanous Breathing and Patient connected to nasal cannula oxygen  Post-op Pain: mild  Post-op Assessment: Post-op Vital signs reviewed and Patient's Cardiovascular Status Stable  Post-op Vital Signs: stable  Complications: No apparent anesthesia complications

## 2011-12-22 NOTE — Transfer of Care (Signed)
Immediate Anesthesia Transfer of Care Note  Patient: Sherri [name]  Procedure(s) Performed: Procedure(s) (LRB): TOTAL HIP ARTHROPLASTY (Left)  Patient Location: PACU  Anesthesia Type: General  Level of Consciousness: awake, alert , oriented and patient cooperative  Airway & Oxygen Therapy: Patient Spontanous Breathing and Patient connected to face mask oxygen  Post-op Assessment: Report given to PACU RN  Post vital signs: Reviewed and stable  Complications: No apparent anesthesia complications

## 2011-12-22 NOTE — Preoperative (Signed)
Beta Blockers   Reason not to administer Beta Blockers:Not Applicable 

## 2011-12-22 NOTE — Interval H&P Note (Signed)
History and Physical Interval Note:  12/22/2011 7:20 AM  [name]  has presented today for surgery, with the diagnosis of OSTEOARTHRITIS LEFT HIP  The various methods of treatment have been discussed with the patient and family. After consideration of risks, benefits and other options for treatment, the patient has consented to  Procedure(s) (LRB): TOTAL HIP ARTHROPLASTY (Left) as a surgical intervention .  The patient's history has been reviewed, patient examined, no change in status, stable for surgery.  I have reviewed the patient's chart and labs.  Questions were answered to the patient's satisfaction.     Sherri [name]

## 2011-12-22 NOTE — Plan of Care (Signed)
Problem: Consults Goal: Diagnosis- Total Joint Replacement Primary Total Hip     

## 2011-12-22 NOTE — Progress Notes (Signed)
UR COMPLETED  

## 2011-12-22 NOTE — Anesthesia Procedure Notes (Signed)
Procedure Name: Intubation Date/Time: 12/22/2011 7:44 AM Performed by: Glendora Score A Pre-anesthesia Checklist: Patient identified, Emergency Drugs available, Suction available and Patient being monitored Patient Re-evaluated:Patient Re-evaluated prior to inductionOxygen Delivery Method: Circle system utilized Preoxygenation: Pre-oxygenation with 100% oxygen Intubation Type: IV induction Ventilation: Mask ventilation without difficulty and Oral airway inserted - appropriate to patient size Laryngoscope Size: Hyacinth Meeker and 2 Grade View: Grade I Tube type: Oral Tube size: 7.5 mm Number of attempts: 1 Airway Equipment and Method: Stylet Placement Confirmation: ETT inserted through vocal cords under direct vision,  positive ETCO2 and breath sounds checked- equal and bilateral Secured at: 20 cm Tube secured with: Tape Dental Injury: Teeth and Oropharynx as per pre-operative assessment

## 2011-12-22 NOTE — Anesthesia Preprocedure Evaluation (Addendum)
Anesthesia Evaluation  Patient identified by MRN, date of birth, ID band Patient awake    Reviewed: Allergy & Precautions, H&P , NPO status , Patient's Chart, lab work & pertinent test results  Airway Mallampati: I  Neck ROM: Full    Dental  (+) Teeth Intact   Pulmonary  breath sounds clear to auscultation        Cardiovascular hypertension, Pt. on medications Rhythm:Regular Rate:Normal     Neuro/Psych Anxiety MS  Neuromuscular disease    GI/Hepatic   Endo/Other    Renal/GU      Musculoskeletal  (+) Arthritis -, Osteoarthritis, Rheumatoid disorders and on steriods ,    Abdominal   Peds  Hematology   Anesthesia Other Findings   Reproductive/Obstetrics                          Anesthesia Physical Anesthesia Plan  ASA: II  Anesthesia Plan: General   Post-op Pain Management:    Induction: Intravenous  Airway Management Planned: Oral ETT  Additional Equipment:   Intra-op Plan:   Post-operative Plan: Extubation in OR  Informed Consent: I have reviewed the patients History and Physical, chart, labs and discussed the procedure including the risks, benefits and alternatives for the proposed anesthesia with the patient or authorized representative who has indicated his/her understanding and acceptance.   Dental advisory given  Plan Discussed with: CRNA, Anesthesiologist and Surgeon  Anesthesia Plan Comments: (Arthrits L. Hip Rheumatoid arthritis on chronic prednisone Htn S/P rib fractures and R. Pneumothorax 2000  Plan GA With peri-op steroid coverage.  Kipp Brood, MD)       Anesthesia Quick Evaluation

## 2011-12-23 ENCOUNTER — Encounter (HOSPITAL_COMMUNITY): Payer: Self-pay | Admitting: Orthopedic Surgery

## 2011-12-23 LAB — CBC
HCT: 30.6 % — ABNORMAL LOW (ref 36.0–46.0)
MCH: 32.5 pg (ref 26.0–34.0)
MCV: 95.6 fL (ref 78.0–100.0)
Platelets: 223 10*3/uL (ref 150–400)
RBC: 3.2 MIL/uL — ABNORMAL LOW (ref 3.87–5.11)
RDW: 14.1 % (ref 11.5–15.5)

## 2011-12-23 LAB — BASIC METABOLIC PANEL
BUN: 5 mg/dL — ABNORMAL LOW (ref 6–23)
CO2: 28 mEq/L (ref 19–32)
Calcium: 7.9 mg/dL — ABNORMAL LOW (ref 8.4–10.5)
Creatinine, Ser: 0.6 mg/dL (ref 0.50–1.10)

## 2011-12-23 NOTE — Care Management Note (Signed)
    Page 1 of 1   12/23/2011     11:52:18 AM   CARE MANAGEMENT NOTE 12/23/2011  Patient:  Sherri [name], Sherri [name]   Account Number:  [id number]  Date Initiated:  12/23/2011  Documentation initiated by:  Anette Guarneri  Subjective/Objective Assessment:   POD#1 s/p left THA  plans d/c home, will need [facility] services  MD pre-arranged     Action/Plan:   Home with [facility] services   Anticipated DC Date:  12/26/2011   Anticipated DC Plan:  HOME W HOME HEALTH SERVICES      DC Planning Services  CM consult      Choice offered to / List presented to:             Status of service:  Completed, signed off Medicare Important Message given?   (If response is "NO", the following Medicare IM given date fields will be blank) Date Medicare IM given:   Date Additional Medicare IM given:    Discharge Disposition:  HOME W HOME HEALTH SERVICES  Per UR Regulation:  Reviewed for med. necessity/level of care/duration of stay  If discussed at Long Length of Stay Meetings, dates discussed:    Comments:  12/23/11 11:48  Anette Guarneri RN/CM Spoke with patient and husband regarding d/c plans. Patient plans to return home, husband will assist with care. per patient she has RW at home, shower is walk in shower and is equiped with seat, per Ms. Blasher she has elevated toilet and is equiped with handicap bars, has ramp entrance into home. MD Office pre-arranged [facility] services with [facility].

## 2011-12-23 NOTE — Evaluation (Signed)
Physical Therapy Evaluation Patient Details Name: Sherri [name] MRN: [id number] DOB: 13-Jan-1941 Today's Date: 12/23/2011 Time: 4098-1191 PT Time Calculation (min): 25 min  PT Assessment / Plan / Recommendation Clinical Impression  Pt is a 71 y/o female admitted s/p left THA along with the below PT problem list.  Pt would benefit from acute PT to maximize independence and facilitate d/c home with HHPT.    PT Assessment  Patient needs continued PT services    Follow Up Recommendations  Home health PT    Barriers to Discharge None      Equipment Recommendations  None recommended by PT    Recommendations for Other Services     Frequency 7X/week    Precautions / Restrictions Precautions Precautions: Posterior Hip Precaution Booklet Issued: Yes (comment) Precaution Comments: Educated on 3/3 posterior hip precautions. Restrictions Weight Bearing Restrictions: Yes LLE Weight Bearing: Weight bearing as tolerated   Pertinent Vitals/Pain 4/10 in left hip.  Pt repositioned.      Mobility  Bed Mobility Bed Mobility: Supine to Sit Supine to Sit: 4: Min assist Details for Bed Mobility Assistance: Assist to left LE to maintain posterior hip precautions with cues for safe sequence. Transfers Transfers: Sit to Stand;Stand to Sit Sit to Stand: 4: Min guard;With upper extremity assist;From bed Stand to Sit: 4: Min guard;With upper extremity assist;To chair/3-in-1 Details for Transfer Assistance: Guarding for balance with cues for safest hand/left LE placement. Ambulation/Gait Ambulation/Gait Assistance: 4: Min guard Ambulation Distance (Feet): 250 Feet Assistive device: Rolling walker Ambulation/Gait Assistance Details: Guarding for balance with cues for safest sequence. Gait Pattern: Step-through pattern;Decreased step length - left;Decreased stance time - left Stairs: No Wheelchair Mobility Wheelchair Mobility: No    Exercises Total Joint Exercises Ankle Circles/Pumps:  AROM;Left;10 reps;Supine Quad Sets: AROM;Left;10 reps;Supine Heel Slides: AROM;Left;10 reps;Supine   PT Diagnosis: Difficulty walking;Acute pain  PT Problem List: Decreased strength;Decreased activity tolerance;Decreased balance;Decreased mobility;Decreased knowledge of use of DME;Decreased knowledge of precautions;Pain PT Treatment Interventions: DME instruction;Gait training;Functional mobility training;Therapeutic activities;Therapeutic exercise;Balance training;Patient/family education   PT Goals Acute Rehab PT Goals PT Goal Formulation: With patient Time For Goal Achievement: 12/30/11 Potential to Achieve Goals: Good Pt will go Supine/Side to Sit: with modified independence PT Goal: Supine/Side to Sit - Progress: Goal set today Pt will go Sit to Supine/Side: with modified independence PT Goal: Sit to Supine/Side - Progress: Goal set today Pt will go Sit to Stand: with modified independence PT Goal: Sit to Stand - Progress: Goal set today Pt will go Stand to Sit: with modified independence PT Goal: Stand to Sit - Progress: Goal set today Pt will Ambulate: >150 feet;with modified independence;with least restrictive assistive device PT Goal: Ambulate - Progress: Goal set today Pt will Perform Home Exercise Program: Independently PT Goal: Perform Home Exercise Program - Progress: Goal set today  Visit Information  Last PT Received On: 12/23/11 Assistance Needed: +1    Subjective Data  Subjective: "It doesn't hurt any more than normal." Patient Stated Goal: Go home.   Prior Functioning  Home Living Lives With: Spouse Available Help at Discharge: Family Type of Home: House Home Access: Ramped entrance Home Layout: Able to live on main level with bedroom/bathroom;Full bath on main level;Two level Bathroom Shower/Tub: Health visitor: Handicapped height Home Adaptive Equipment: Walker - rolling;Straight cane Prior Function Level of Independence:  Independent Able to Take Stairs?: Yes Driving: Yes Vocation: Retired Musician: No difficulties    Cognition  Overall Cognitive Status: Appears within functional limits  for tasks assessed/performed Arousal/Alertness: Awake/alert Orientation Level: Appears intact for tasks assessed Behavior During Session: [facility] for tasks performed    Extremity/Trunk Assessment Right Upper Extremity Assessment RUE ROM/Strength/Tone: Within functional levels RUE Sensation: WFL - Light Touch RUE Coordination: WFL - gross/fine motor Left Upper Extremity Assessment LUE ROM/Strength/Tone: Within functional levels LUE Sensation: WFL - Light Touch LUE Coordination: WFL - gross/fine motor Right Lower Extremity Assessment RLE ROM/Strength/Tone: Within functional levels RLE Sensation: WFL - Light Touch RLE Coordination: WFL - gross/fine motor Left Lower Extremity Assessment LLE ROM/Strength/Tone: Within functional levels LLE Sensation: WFL - Light Touch LLE Coordination: WFL - gross/fine motor Trunk Assessment Trunk Assessment: Normal   Balance Balance Balance Assessed: No  End of Session PT - End of Session Equipment Utilized During Treatment: Gait belt Activity Tolerance: Patient tolerated treatment well Patient left: in chair;with call bell/phone within reach;with family/visitor present Nurse Communication: Mobility status  GP     Cephus Shelling 12/23/2011, 9:09 AM  12/23/2011 Cephus Shelling, PT, DPT [phone]

## 2011-12-23 NOTE — Progress Notes (Signed)
Physical Therapy Treatment Patient Details Name: Sherri [name] MRN: [id number] DOB: January 18, 1941 Today's Date: 12/23/2011 Time: 1125-1140 PT Time Calculation (min): 15 min  PT Assessment / Plan / Recommendation Comments on Treatment Session  Pt admitted s/p left THA and continues to progress.  Pt able to increase ambulation distance and independence this treatment.  Motivated!    Follow Up Recommendations  Home health PT    Barriers to Discharge None      Equipment Recommendations  None recommended by PT    Recommendations for Other Services    Frequency 7X/week   Plan Discharge plan remains appropriate;Frequency remains appropriate    Precautions / Restrictions Precautions Precautions: Posterior Hip Precaution Booklet Issued: No Precaution Comments: Pt able to recall 3/3 posterior hip precautions. Restrictions Weight Bearing Restrictions: Yes LLE Weight Bearing: Weight bearing as tolerated   Pertinent Vitals/Pain 6/10 in left hip.  Pt repositioned and RN notified.    Mobility  Bed Mobility Bed Mobility: Not assessed Transfers Transfers: Sit to Stand;Stand to Sit Sit to Stand: 5: Supervision;With upper extremity assist;From chair/3-in-1 Stand to Sit: 5: Supervision;With upper extremity assist;To chair/3-in-1 Details for Transfer Assistance: Verbal cues for safest hand/left LE placement. Ambulation/Gait Ambulation/Gait Assistance: 5: Supervision Ambulation Distance (Feet): 600 Feet Assistive device: Rolling walker Ambulation/Gait Assistance Details: Verbal cues for tall posture and safest sequence with RW. Gait Pattern: Step-through pattern;Decreased step length - left;Decreased stance time - left Stairs: No Wheelchair Mobility Wheelchair Mobility: No    Exercises    PT Diagnosis: Difficulty walking;Acute pain  PT Problem List: Decreased strength;Decreased activity tolerance;Decreased balance;Decreased mobility;Decreased knowledge of use of DME;Decreased  knowledge of precautions;Pain PT Treatment Interventions: DME instruction;Gait training;Functional mobility training;Therapeutic activities;Therapeutic exercise;Balance training;Patient/family education   PT Goals Acute Rehab PT Goals PT Goal Formulation: With patient Time For Goal Achievement: 12/30/11 Potential to Achieve Goals: Good Pt will go Supine/Side to Sit: with modified independence PT Goal: Supine/Side to Sit - Progress: Goal set today Pt will go Sit to Supine/Side: with modified independence PT Goal: Sit to Supine/Side - Progress: Goal set today Pt will go Sit to Stand: with modified independence PT Goal: Sit to Stand - Progress: Progressing toward goal Pt will go Stand to Sit: with modified independence PT Goal: Stand to Sit - Progress: Progressing toward goal Pt will Ambulate: >150 feet;with modified independence;with least restrictive assistive device PT Goal: Ambulate - Progress: Progressing toward goal Pt will Perform Home Exercise Program: Independently PT Goal: Perform Home Exercise Program - Progress: Goal set today  Visit Information  Last PT Received On: 12/23/11 Assistance Needed: +1    Subjective Data  Subjective: "I shouldn't wait so long to get medicine." Patient Stated Goal: Go home.   Cognition  Overall Cognitive Status: Appears within functional limits for tasks assessed/performed Arousal/Alertness: Awake/alert Orientation Level: Appears intact for tasks assessed Behavior During Session: [facility] for tasks performed    Balance  Balance Balance Assessed: No  End of Session PT - End of Session Equipment Utilized During Treatment: Gait belt Activity Tolerance: Patient tolerated treatment well Patient left: in chair;with call bell/phone within reach;with family/visitor present Nurse Communication: Mobility status   GP     Cephus Shelling 12/23/2011, 11:58 AM  12/23/2011 Cephus Shelling, PT, DPT [phone]

## 2011-12-23 NOTE — Progress Notes (Signed)
Patient Sherri [name], 71 year old white female is in good spirits today, and seems appreciative of Chaplain's provision of pastoral presence, prayer, and conversation.  I will follow as needed.

## 2011-12-23 NOTE — Progress Notes (Signed)
Patient ID: Sherri [name], female   DOB: November 10, 1940, 71 y.o.   MRN: [id number] PATIENT ID: Sherri [name]  MRN: [id number]  DOB/AGE:  Aug 24, 1940 / 71 y.o.  1 Day Post-Op Procedure(s) (LRB): TOTAL HIP ARTHROPLASTY (Left)    PROGRESS NOTE Subjective: Patient is alert, oriented,no Nausea, no Vomiting, yes passing gas, no Bowel Movement. Taking PO well. Denies SOB, Chest or Calf Pain. Using Incentive Spirometer, PAS in place. Ambulate WBAT yesterday Patient reports pain as 1 on 0-10 scale  .    Objective: Vital signs in last 24 hours: Filed Vitals:   12/22/11 1400 12/22/11 2142 12/23/11 0216 12/23/11 0532  BP: 119/70 113/44 113/50 109/47  Pulse: 86 75 75 88  Temp: 98.1 F (36.7 C) 97.8 F (36.6 C) 98.4 F (36.9 C) 98 F (36.7 C)  TempSrc:      Resp: 18 18 18 17   SpO2: 100% 100% 100% 100%      Intake/Output from previous day: I/O last 3 completed shifts: In: 4007.8 [P.O.:120; I.V.:3132.8; IV Piggyback:755] Out: 3600 [Urine:3300; Blood:300]   Intake/Output this shift:     LABORATORY DATA:  Basename 12/23/11 0613  WBC 6.3  HGB 10.4*  HCT 30.6*  PLT 223  NA --  K --  CL --  CO2 --  BUN --  CREATININE --  GLUCOSE --  GLUCAP --  INR --  CALCIUM --    Examination: Neurologically intact ABD soft Neurovascular intact Sensation intact distally Intact pulses distally Dorsiflexion/Plantar flexion intact Incision: moderate drainage No cellulitis present Compartment soft} XR AP&Lat of hip shows well placed\fixed THA  Assessment:   1 Day Post-Op Procedure(s) (LRB): TOTAL HIP ARTHROPLASTY (Left) ADDITIONAL DIAGNOSIS:  RA  Plan: PT/OT WBAT, THA  posterior precautions, new mepilex dressing today  DVT Prophylaxis: SCDx72 hrs, ASA 325 mg BID x 2 weeks  DISCHARGE PLAN: Home  DISCHARGE NEEDS: HHPT, HHRN, CPM, Walker and 3-in-1 comode seat

## 2011-12-24 DIAGNOSIS — M1612 Unilateral primary osteoarthritis, left hip: Secondary | ICD-10-CM | POA: Diagnosis present

## 2011-12-24 LAB — CBC
MCHC: 34.3 g/dL (ref 30.0–36.0)
MCV: 97 fL (ref 78.0–100.0)
Platelets: 242 10*3/uL (ref 150–400)
RDW: 14.6 % (ref 11.5–15.5)
WBC: 7.4 10*3/uL (ref 4.0–10.5)

## 2011-12-24 MED ORDER — OXYCODONE-ACETAMINOPHEN 5-325 MG PO TABS: 1.0000 | ORAL_TABLET | ORAL | Status: AC | PRN

## 2011-12-24 MED ORDER — ASPIRIN 325 MG PO TBEC: 325.0000 mg | DELAYED_RELEASE_TABLET | Freq: Two times a day (BID) | ORAL | Status: AC

## 2011-12-24 MED ORDER — METHOCARBAMOL 500 MG PO TABS: 500.0000 mg | ORAL_TABLET | Freq: Four times a day (QID) | ORAL | Status: AC | PRN

## 2011-12-24 NOTE — Discharge Summary (Signed)
Patient ID: Sherri [name] MRN: [id number] DOB/AGE: 70-Oct-1942 70 y.o.  Admit date: 12/22/2011 Discharge date: 12/24/2011  Admission Diagnoses:  Principal Problem:  *Osteoarthritis of left hip   Discharge Diagnoses:  Same  Past Medical History  Diagnosis Date  . Nonspecific abnormal electrocardiogram (ECG) (EKG)     EKG is normal but reveals early repolarization ST-T changes.  . Rheumatoid arthritis     Dr Corliss Skains for rheumatoid  . OA (osteoarthritis)     Dr Charlett Blake  . Neuromuscular disorder     Dr. Hebron Nation- in [place]- [facility] follows [facility]  . Hypertension     stress test many yrs. ago  . GERD (gastroesophageal reflux disease)     +reflux, on occas, no Rx  . Cancer     basal cell ca, insitu- uterine     Surgeries: Procedure(s): TOTAL HIP ARTHROPLASTY on 12/22/2011   Consultants:    Discharged Condition: Improved  Hospital Course: Sherri [name] is an 71 y.o. female who was admitted 12/22/2011 for operative treatment ofOsteoarthritis of left hip. Patient has severe unremitting pain that affects sleep, daily activities, and work/hobbies. After pre-op clearance the patient was taken to the operating room on 12/22/2011 and underwent  Procedure(s): TOTAL HIP ARTHROPLASTY.    Patient was given perioperative antibiotics: Anti-infectives     Start     Dose/Rate Route Frequency Ordered Stop   12/22/11 1200   hydroxychloroquine (PLAQUENIL) tablet 200 mg        200 mg Oral Daily 12/22/11 1047     12/21/11 1431   vancomycin (VANCOCIN) 1,500 mg in sodium chloride 0.9 % 500 mL IVPB        1,500 mg 250 mL/hr over 120 Minutes Intravenous 120 min pre-op 12/21/11 1431 12/22/11 0752           Patient was given sequential compression devices, early ambulation, and chemoprophylaxis to prevent DVT.  Patient benefited maximally from hospital stay and there were no complications.    Recent vital signs: Patient Vitals for the past 24 hrs:  BP Temp Temp src Pulse Resp SpO2    12/24/11 0538 104/58 mmHg 99.7 F (37.6 C) - 95  18  97 %  2012/01/12 2108 125/56 mmHg 99.3 F (37.4 C) Oral 99  18  98 %  Jan 12, 2012 0800 - - - - 16  100 %     Recent laboratory studies:  [facility] 12/24/11 0635 January 12, 2012 0613  WBC 7.4 6.3  HGB 9.9* 10.4*  HCT 28.9* 30.6*  PLT 242 223  NA -- 137  K -- 3.9  CL -- 103  CO2 -- 28  BUN -- 5*  CREATININE -- 0.60  GLUCOSE -- 87  INR -- --  CALCIUM -- 7.9*     Discharge Medications:   Medication List  As of 12/24/2011  7:58 AM   TAKE these medications         aspirin 325 MG EC tablet   Take 1 tablet (325 mg total) by mouth 2 (two) times daily.      Biotin 5000 MCG Tabs   Take 5,000 mcg by mouth daily.      CALCIUM + D PO   Take 1 tablet by mouth daily. 1200 mg daily      carboxymethylcellulose 0.5 % Soln   Commonly known as: REFRESH PLUS   Place 1 drop into both eyes 2 (two) times daily.      diazepam 5 MG tablet   Commonly known as: VALIUM   Take  5 mg by mouth at bedtime.      folic acid 1 MG tablet   Commonly known as: FOLVITE   Take 1 mg by mouth 2 (two) times daily.      furosemide 20 MG tablet   Commonly known as: LASIX   Take 1 tablet (20 mg total) by mouth daily.      hydroxychloroquine 200 MG tablet   Commonly known as: PLAQUENIL   Take 200 mg by mouth daily.      methocarbamol 500 MG tablet   Commonly known as: ROBAXIN   Take 1 tablet (500 mg total) by mouth every 6 (six) hours as needed.      Methotrexate Sodium (PF) 50 MG/2ML Soln   Inject 25 mg into the muscle once a week. Every Tuesday injects into thigh      OMEGA 3 PO   Take 1,000 mg by mouth daily.      oxyCODONE-acetaminophen 5-325 MG per tablet   Commonly known as: PERCOCET/ROXICET   Take 1-2 tablets by mouth every 4 (four) hours as needed for pain.      predniSONE 5 MG tablet   Commonly known as: DELTASONE   Take 5 mg by mouth daily.      ramipril 5 MG capsule   Commonly known as: ALTACE   Take 5 mg by mouth daily before breakfast.       traMADol 50 MG tablet   Commonly known as: ULTRAM   Take 50-100 mg by mouth every 4 (four) hours. Maximum dose= 8 tablets per day      VITAMIN B-12 PO   Take 1 tablet by mouth daily.      VITAMIN B-6 PO   Take 1 tablet by mouth daily.            Diagnostic Studies: Dg Chest 2 View  12/16/2011  *RADIOLOGY REPORT*  Clinical Data: Total hip arthroplasty.  History of smoking. History hypertension, GERD, rib injury and pneumothorax.  CHEST - 2 VIEW  Comparison: 02/18/2007  Findings: Lungs are high inflated.  Heart size is normal.  There are no focal consolidations or pleural effusions.  Bronchitic changes are present.  Old rib fractures are noted.  The patient has had prior cervical fusion.  IMPRESSION:  1.  Bronchitic changes. 2.  No evidence for acute cardiopulmonary abnormality.  Original Report Authenticated By: Patterson Hammersmith, M.D.   Dg Pelvis Portable  12/22/2011  *RADIOLOGY REPORT*  Clinical Data: Postop left hip replacement.  PORTABLE PELVIS  Comparison: None.  Findings: Changes of left hip replacement noted.  Normal alignment. No hardware or bony complicating feature.  Postoperative changes in the lower lumbar spine.  IMPRESSION: Left hip replacement.  No complicating feature.  Original Report Authenticated By: Cyndie Chime, M.D.   Dg Hip Portable 1 View Left  12/22/2011  *RADIOLOGY REPORT*  Clinical Data: Postop radiograph following left total hip arthroplasty.  PORTABLE LEFT HIP - 1 VIEW  Comparison: 12/22/2011.  Findings: Lateral cross-table view of the left hip demonstrates an uncomplicated left total hip arthroplasty which is located.  IMPRESSION: Uncomplicated left total hip arthroplasty.  Original Report Authenticated By: Andreas Newport, M.D.    Disposition:   Discharge Orders    Future Orders Please Complete By Expires   Increase activity slowly      Walker       May shower / Bathe      Driving Restrictions      Comments:   No driving for 2  weeks.   Change  dressing (specify)      Comments:   Dressing change as needed.   Call MD for:  temperature >100.4      Call MD for:  severe uncontrolled pain      Call MD for:  redness, tenderness, or signs of infection (pain, swelling, redness, odor or green/yellow discharge around incision site)      Discharge instructions      Comments:   Weightbearing as tolerated.  F/U with Dr. Turner Daniels as scheduled (2 weeks post-op).         SignedHazle Nordmann. 12/24/2011, 7:58 AM

## 2011-12-24 NOTE — Progress Notes (Signed)
PATIENT ID: Sherri [name]  MRN: [id number]  DOB/AGE:  70-Jun-1942 / 71 y.o.  2 Days Post-Op Procedure(s) (LRB): TOTAL HIP ARTHROPLASTY (Left)    PROGRESS NOTE Subjective: Patient is alert, oriented,no Nausea, no Vomiting, yes passing gas, no Bowel Movement. Taking PO well. Denies SOB, Chest or Calf Pain. Using Incentive Spirometer, PAS in place. Ambulating very well with PT. Patient reports pain as moderate  .    Objective: Vital signs in last 24 hours: Filed Vitals:   12/23/11 0532 12/23/11 0800 12/23/11 2108 12/24/11 0538  BP: 109/47  125/56 104/58  Pulse: 88  99 95  Temp: 98 F (36.7 C)  99.3 F (37.4 C) 99.7 F (37.6 C)  TempSrc:   Oral   Resp: 17 16 18 18   SpO2: 100% 100% 98% 97%      Intake/Output from previous day: I/O last 3 completed shifts: In: 1332.8 [I.V.:1132.8; IV Piggyback:200] Out: 1400 [Urine:1400]   Intake/Output this shift:     LABORATORY DATA:  Basename 12/24/11 0635 12/23/11 0613  WBC 7.4 6.3  HGB 9.9* 10.4*  HCT 28.9* 30.6*  PLT 242 223  NA -- 137  K -- 3.9  CL -- 103  CO2 -- 28  BUN -- 5*  CREATININE -- 0.60  GLUCOSE -- 87  GLUCAP -- --  INR -- --  CALCIUM -- 7.9*    Examination: Neurologically intact ABD soft Neurovascular intact Sensation intact distally Intact pulses distally Dorsiflexion/Plantar flexion intact Incision: scant drainage} XR AP&Lat of hip shows well placed\fixed THA  Assessment:   2 Days Post-Op Procedure(s) (LRB): TOTAL HIP ARTHROPLASTY (Left) ADDITIONAL DIAGNOSIS:  none  Plan: PT/OT WBAT, THA  posterior precautions  DVT Prophylaxis: SCDx72 hrs, ASA 325 mg BID x 2 weeks  DISCHARGE PLAN: Home today.  Dressing change before d/c.  DISCHARGE NEEDS: HHPT, HHRN, Walker and 3-in-1 comode seat

## 2011-12-24 NOTE — Progress Notes (Signed)
Occupational Therapy Evaluation Patient Details Name: SOJOURNER BEHRINGER MRN: [id number] DOB: 1940-10-18 Today's Date: 12/24/2011 Time: 7829-5621 OT Time Calculation (min): 19 min  OT Assessment / Plan / Recommendation Clinical Impression  71 yo s/p L THA with post hip precautions. Completed edcucation in acute setting, but will benefit from [facility].    OT Assessment  All further OT needs can be met in the next venue of care    Follow Up Recommendations  Home health OT    Barriers to Discharge  none    Equipment Recommendations  None recommended by OT    Recommendations for Other Services    Frequency    to be followed by [facility]   Precautions / Restrictions Precautions Precautions: Posterior Hip Precaution Booklet Issued: No Precaution Comments: Pt able to recall 3/3 posterior hip precautions. Restrictions Weight Bearing Restrictions: Yes LLE Weight Bearing: Weight bearing as tolerated   Pertinent Vitals/Pain No c/o pain    ADL  Eating/Feeding: Independent Grooming: Modified independent Upper Body Bathing: Set up Lower Body Bathing: Moderate assistance Where Assessed - Lower Body Bathing: Unsupported sit to stand Upper Body Dressing: Simulated;Set up Where Assessed - Upper Body Dressing: Supported sitting Lower Body Dressing: Performed;Moderate assistance Where Assessed - Lower Body Dressing: Unsupported sit to stand Toilet Transfer: Simulated;Supervision/safety Toilet Transfer Method: Sit to Barista: Comfort height toilet Toileting - Clothing Manipulation and Hygiene: Simulated;Modified independent Where Assessed - Toileting Clothing Manipulation and Hygiene: Sit to stand from 3-in-1 or toilet Tub/Shower Transfer: Simulated;Supervision/safety Equipment Used: Rolling walker;Reacher;Sock aid;Long-handled shoe horn;Long-handled sponge Transfers/Ambulation Related to ADLs: S ADL Comments: Completed education regarding ADL and post hip precautions. Pt  verbalized understanding of precautions and use of AE. Husband present for session.    OT Diagnosis: Generalized weakness;Acute pain  OT Problem List: Decreased strength;Decreased range of motion;Decreased knowledge of use of DME or AE;Decreased safety awareness;Decreased knowledge of precautions OT Treatment Interventions:     OT Goals Acute Rehab OT Goals OT Goal Formulation:  (eval only)  Visit Information  Last OT Received On: 12/24/11 Assistance Needed: +1    Subjective Data      Prior Functioning  Vision/Perception  Home Living Lives With: Spouse Available Help at Discharge: Family Type of Home: House Home Access: Ramped entrance Home Layout: Able to live on main level with bedroom/bathroom;Full bath on main level;Two level Bathroom Shower/Tub: Health visitor: Handicapped height Bathroom Accessibility: Yes How Accessible: Accessible via walker Home Adaptive Equipment: Walker - rolling;Straight cane Prior Function Level of Independence: Independent Able to Take Stairs?: Yes Driving: Yes Vocation: Retired Musician: No difficulties Dominant Hand: Right      Cognition  Overall Cognitive Status: Appears within functional limits for tasks assessed/performed Arousal/Alertness: Awake/alert Orientation Level: Appears intact for tasks assessed Behavior During Session: [facility] for tasks performed    Extremity/Trunk Assessment Right Upper Extremity Assessment RUE ROM/Strength/Tone: Within functional levels Left Upper Extremity Assessment LUE ROM/Strength/Tone: Within functional levels   Mobility Bed Mobility Bed Mobility: Not assessed Transfers Sit to Stand: 6: Modified independent (Device/Increase time);With upper extremity assist;From chair/3-in-1 Stand to Sit: 6: Modified independent (Device/Increase time);With upper extremity assist;To chair/3-in-1   Exercise Total Joint Exercises Ankle Circles/Pumps: AROM;Left;10 reps;Supine Quad  Sets: AROM;Left;10 reps;Supine Short Arc Quad: AROM;Left;10 reps;Supine Heel Slides: AROM;Left;10 reps;Supine Hip ABduction/ADduction: AROM;Left;10 reps;Standing Long Arc Quad: AROM;Left;10 reps;Seated Knee Flexion: AROM;Left;10 reps;Standing Marching in Standing: AROM;Left;10 reps;Standing Standing Hip Extension: AROM;Left;10 reps;Standing  Balance Balance Balance Assessed: No  End of Session OT - End  of Session Activity Tolerance: Patient tolerated treatment well Patient left: in chair;with call bell/phone within reach;with family/visitor present  GO     Kalimah Capurro,HILLARY 12/24/2011, 10:30 AM Luisa Dago, OTR/L  [phone] 12/24/2011

## 2011-12-24 NOTE — Progress Notes (Signed)
PT Note   12/24/11 1006  PT Visit Information  Last PT Received On 12/24/11  Assistance Needed +1  PT Time Calculation  PT Start Time 0957  PT Stop Time 1006  PT Time Calculation (min) 9 min  Subjective Data  Subjective "I am ready."  Patient Stated Goal Go home.  Precautions  Precautions Posterior Hip  Precaution Booklet Issued No  Precaution Comments Pt able to recall 3/3 posterior hip precautions.  Restrictions  Weight Bearing Restrictions Yes  LLE Weight Bearing WBAT  Cognition  Overall Cognitive Status Appears within functional limits for tasks assessed/performed  Arousal/Alertness Awake/alert  Orientation Level Appears intact for tasks assessed  Behavior During Session [facility] for tasks performed  Bed Mobility  Bed Mobility Not assessed  Transfers  Transfers Sit to Stand;Stand to Sit  Sit to Stand 6: Modified independent (Device/Increase time);With upper extremity assist;From chair/3-in-1  Stand to Sit 6: Modified independent (Device/Increase time);With upper extremity assist;To chair/3-in-1  Ambulation/Gait  Ambulation/Gait Assistance Not tested (comment)  Stairs No  Wheelchair Mobility  Wheelchair Mobility No  Balance  Balance Assessed No  Exercises  Exercises Total Joint  Total Joint Exercises  Hip ABduction/ADduction AROM;Left;10 reps;Standing  Knee Flexion AROM;Left;10 reps;Standing  Marching in Standing AROM;Left;10 reps;Standing  Standing Hip Extension AROM;Left;10 reps;Standing  PT - End of Session  Activity Tolerance Patient tolerated treatment well  Patient left in chair;with call bell/phone within reach;with family/visitor present;with nursing in room  Nurse Communication Mobility status  PT - Assessment/Plan  Comments on Treatment Session Pt admitted s/p left THA and is ready for d/c home.  Pt able to perform standing therapeutic exercises prior to d/c home.  Motivated!  PT Plan Discharge plan remains appropriate;Frequency remains appropriate  PT  Frequency 7X/week  Follow Up Recommendations Home health PT  Equipment Recommended None recommended by PT  Acute Rehab PT Goals  PT Goal Formulation With patient  Time For Goal Achievement 12/30/11  Potential to Achieve Goals Good  PT Goal: Perform Home Exercise Program - Progress Progressing toward goal  PT General Charges  $$ ACUTE PT VISIT 1 Procedure  PT Treatments  $Therapeutic Exercise 8-22 mins    Pain:  3/10 in left hip.  Pt repositioned.  12/24/2011 Cephus Shelling, PT, DPT [phone]

## 2011-12-24 NOTE — Progress Notes (Signed)
Physical Therapy Treatment Patient Details Name: Sherri [name] MRN: [id number] DOB: Jun 06, 1940 Today's Date: 12/24/2011 Time: 2130-8657 PT Time Calculation (min): 27 min  PT Assessment / Plan / Recommendation Comments on Treatment Session  Pt admitted s/p left THA and is ready for safe d/c home once medically cleared by MD.  Pt able to continue to tolerate increased ambulation with increased independence as well as therapeutic exercises.    Follow Up Recommendations  Home health PT    Barriers to Discharge        Equipment Recommendations  None recommended by PT    Recommendations for Other Services    Frequency 7X/week   Plan Discharge plan remains appropriate;Frequency remains appropriate    Precautions / Restrictions Precautions Precautions: Posterior Hip Precaution Booklet Issued: No Precaution Comments: Pt able to recall 3/3 posterior hip precautions. Restrictions Weight Bearing Restrictions: Yes LLE Weight Bearing: Weight bearing as tolerated   Pertinent Vitals/Pain 4/10 in left hip.  Pt repositioned.    Mobility  Bed Mobility Bed Mobility: Not assessed Transfers Transfers: Sit to Stand;Stand to Sit (2 trials.) Sit to Stand: 6: Modified independent (Device/Increase time);With upper extremity assist;From chair/3-in-1 Stand to Sit: 6: Modified independent (Device/Increase time);With upper extremity assist;To chair/3-in-1 Ambulation/Gait Ambulation/Gait Assistance: 5: Supervision Ambulation Distance (Feet): 600 Feet Assistive device: Rolling walker Ambulation/Gait Assistance Details: Verbal cues for continued safety to protect hip with step-through sequence focusing on initial contact at left heel. Gait Pattern: Step-through pattern;Decreased stride length;Trunk flexed Stairs: No Wheelchair Mobility Wheelchair Mobility: No    Exercises Total Joint Exercises Ankle Circles/Pumps: AROM;Left;10 reps;Supine Quad Sets: AROM;Left;10 reps;Supine Short Arc Quad:  AROM;Left;10 reps;Supine Heel Slides: AROM;Left;10 reps;Supine Hip ABduction/ADduction: AROM;Left;10 reps;Supine Long Arc Quad: AROM;Left;10 reps;Seated   PT Diagnosis:    PT Problem List:   PT Treatment Interventions:     PT Goals Acute Rehab PT Goals PT Goal Formulation: With patient Time For Goal Achievement: 12/30/11 Potential to Achieve Goals: Good PT Goal: Sit to Stand - Progress: Met PT Goal: Stand to Sit - Progress: Met PT Goal: Ambulate - Progress: Progressing toward goal PT Goal: Perform Home Exercise Program - Progress: Progressing toward goal  Visit Information  Last PT Received On: 12/24/11 Assistance Needed: +1    Subjective Data  Subjective: "I get to go home today." Patient Stated Goal: Go home.   Cognition  Overall Cognitive Status: Appears within functional limits for tasks assessed/performed Arousal/Alertness: Awake/alert Orientation Level: Appears intact for tasks assessed Behavior During Session: [facility] for tasks performed    Balance  Balance Balance Assessed: No  End of Session PT - End of Session Equipment Utilized During Treatment: Gait belt Activity Tolerance: Patient tolerated treatment well Patient left: in chair;with call bell/phone within reach Nurse Communication: Mobility status   GP     Cephus Shelling 12/24/2011, 9:17 AM  12/24/2011 Cephus Shelling, PT, DPT [phone]

## 2012-01-30 ENCOUNTER — Ambulatory Visit
Admission: RE | Admit: 2012-01-30 | Discharge: 2012-01-30 | Disposition: A | Discharge status: Home / Self Care | Payer: Medicare Other | Source: Ambulatory Visit | Attending: Internal Medicine | Admitting: Internal Medicine

## 2012-01-30 DIAGNOSIS — Z1231 Encounter for screening mammogram for malignant neoplasm of breast: Secondary | ICD-10-CM

## 2012-03-17 ENCOUNTER — Telehealth: Payer: Self-pay

## 2012-03-17 NOTE — Telephone Encounter (Signed)
Pt called left message on triage line stating hip replacement done 01/22/12, hasn't had BM since without assistance (synacot-s) Pharmacy advised pt she can take that OTC longterm but pt wants you to advise her if okay or what to do. Plz advise    MW

## 2012-03-17 NOTE — Telephone Encounter (Signed)
Spoke with patient, patient verbalized understanding of Dr.Hopper's recommendation

## 2012-03-17 NOTE — Telephone Encounter (Signed)
Miralax every day until BM the  3 rd day as needed for constipation. The most likely cause of this would be narcotic pain medications prescribed following the surgery.

## 2012-04-19 ENCOUNTER — Other Ambulatory Visit (HOSPITAL_COMMUNITY): Payer: Self-pay | Admitting: Rheumatology

## 2012-05-24 ENCOUNTER — Other Ambulatory Visit: Payer: Self-pay | Admitting: Internal Medicine

## 2012-07-10 ENCOUNTER — Other Ambulatory Visit: Payer: Self-pay

## 2012-07-22 ENCOUNTER — Telehealth: Payer: Self-pay | Admitting: Internal Medicine

## 2012-07-22 NOTE — Telephone Encounter (Signed)
Requesting immunizations Appointment Request From: Sherri [name] With Provider: Marga Melnick, MD [-Primary Care Physician-] Preferred Date Range: From 07/22/2012 To 07/30/2012 Preferred Times: Wednesday Afternoon, Thursday Afternoon, Friday Afternoon Reason: To address the following health maintenance concerns. Pneumococcal Polysaccharide Vaccine Age 71 And Over Comments: which one is available Please review and advise and I will schedule accordingly

## 2012-07-22 NOTE — Telephone Encounter (Signed)
Patient is due for a pneumonia vaccine. Please schedule nurse visit, after this pneumonia vaccine patient will NOT need to update pneumonia vaccine again for she is age 71 or older.

## 2012-07-23 NOTE — Telephone Encounter (Signed)
PT SCHEDULED FOR 3.4.14 at 3:15pm

## 2012-07-27 ENCOUNTER — Ambulatory Visit: Payer: Medicare Other

## 2012-08-20 ENCOUNTER — Other Ambulatory Visit: Payer: Self-pay | Admitting: Internal Medicine

## 2012-10-26 ENCOUNTER — Other Ambulatory Visit: Payer: Self-pay | Admitting: *Deleted

## 2012-10-26 MED ORDER — RAMIPRIL 5 MG PO CAPS: 5.0000 mg | ORAL_CAPSULE | Freq: Every day | ORAL | Status: DC

## 2012-10-26 NOTE — Telephone Encounter (Signed)
Rx sent 

## 2012-11-02 ENCOUNTER — Other Ambulatory Visit: Payer: Self-pay | Admitting: Neurosurgery

## 2012-11-02 DIAGNOSIS — M4802 Spinal stenosis, cervical region: Secondary | ICD-10-CM

## 2012-11-09 ENCOUNTER — Ambulatory Visit
Admission: RE | Admit: 2012-11-09 | Discharge: 2012-11-09 | Disposition: A | Discharge status: Home / Self Care | Payer: Medicare Other | Source: Ambulatory Visit | Attending: Neurosurgery | Admitting: Neurosurgery

## 2012-11-09 DIAGNOSIS — M4802 Spinal stenosis, cervical region: Secondary | ICD-10-CM

## 2012-11-11 ENCOUNTER — Other Ambulatory Visit: Payer: Medicare Other

## 2012-11-15 ENCOUNTER — Ambulatory Visit (INDEPENDENT_AMBULATORY_CARE_PROVIDER_SITE_OTHER): Payer: Medicare Other | Admitting: Internal Medicine

## 2012-11-15 ENCOUNTER — Encounter: Payer: Self-pay | Admitting: Internal Medicine

## 2012-11-15 VITALS — BP 118/68 | HR 82 | Temp 98.4°F | Ht 62.5 in | Wt 109.0 lb

## 2012-11-15 DIAGNOSIS — I10 Essential (primary) hypertension: Secondary | ICD-10-CM

## 2012-11-15 DIAGNOSIS — Z0000 Encounter for general adult medical examination without abnormal findings: Secondary | ICD-10-CM

## 2012-11-15 DIAGNOSIS — E785 Hyperlipidemia, unspecified: Secondary | ICD-10-CM

## 2012-11-15 LAB — LIPID PANEL
Cholesterol: 233 mg/dL — ABNORMAL HIGH (ref 0–200)
Total CHOL/HDL Ratio: 2
Triglycerides: 62 mg/dL (ref 0.0–149.0)

## 2012-11-15 MED ORDER — FUROSEMIDE 20 MG PO TABS: ORAL_TABLET | ORAL | Status: DC

## 2012-11-15 MED ORDER — RAMIPRIL 5 MG PO CAPS: 5.0000 mg | ORAL_CAPSULE | Freq: Every day | ORAL | Status: DC

## 2012-11-15 NOTE — Patient Instructions (Addendum)
Share results with all non [facility] medical staff seen  

## 2012-11-15 NOTE — Progress Notes (Signed)
Subjective:    Patient ID: Sherri [name], female    DOB: 06-21-1940, 72 y.o.   MRN: [id number]  HPI Medicare Wellness Visit:  Psychosocial & medical history were reviewed as required by Medicare (abuse,antisocial behavioral risks,firearm risk).  Social history: caffeine:3 cups coffee / day  , alcohol: 14 light beers/day  ,  tobacco use: quit 1983   Exercise :  See below Home & personal  safety / fall risk: no Limitations of activities of daily living: no Seatbelt  and smoke alarm use:yes Power of Attorney/Living Will status : in place Ophthalmology exam status : current Hearing evaluation status: not current Orientation :oriented X 3 Memory & recall :good Math testing: good Active depression / anxiety:denied Foreign travel history : last  1999 [place]  Immunization status for Shingles /Flu/ PNA/ tetanus : current Transfusion history: no  Preventive health surveillance status of colonoscopy, BMD , mammograms,PAP as per protocol/ SOC: current Dental care: every 6 mos   Chart reviewed &  Updated. Active issues reviewed & addressed.      Review of Systems She is on a heart healthy diet; she does not exercise but is physically active in care giving for husband. She denies chest pain, palpitations, dyspnea, or claudication. Family history is negative for premature coronary disease . Advanced cholesterol testing reveals her LDL goal is less than 120. BP @ home 119/70 or lower. .     Objective:   Physical Exam Gen.: Thin but healthy and well-nourished in appearance. Alert, appropriate and cooperative throughout exam. Appears younger than stated age  Head: Normocephalic without obvious abnormalities Eyes: No corneal or conjunctival inflammation noted. Pupils equal round reactive to light and accommodation. Slight lid lag. Extraocular motion intact. Vision grossly normal with lenses Ears: External  ear exam reveals no significant lesions or deformities. Canals clear .TMs normal.  Hearing is grossly decreased on R. Nose: External nasal exam reveals no deformity or inflammation. Nasal mucosa are pink and moist. No lesions or exudates noted.   Mouth: Oral mucosa and oropharynx reveal no lesions or exudates. Teeth in good repair. Neck: No deformities, masses, or tenderness noted. Range of motion decreased in all directions. Thyroid :absent R lobe. Lungs: Normal respiratory effort; chest expands symmetrically. Lungs are clear to auscultation without rales, wheezes, or increased work of breathing. Heart: Normal rate and rhythm. Normal S1 and S2. No gallop, click, or rub. S4 w/o murmur. Abdomen: Bowel sounds normal; abdomen soft and nontender. No masses, organomegaly or hernias noted. Genitalia: As per Gyn                                  Musculoskeletal/extremities: No deformity or scoliosis noted of  the thoracic or lumbar spine.  No clubbing, cyanosis, edema, or significant extremity  deformity noted. Range of motion normal .Decreased  strength R hand to opposition. Interosseous hand wasting. Nail health good. Able to lie down & sit up w/o help. Negative SLR bilaterally Vascular: Carotid, radial artery, dorsalis pedis and  posterior tibial pulses are full and equal. No bruits present. Neurologic: Alert and oriented x3. Deep tendon reflexes symmetrical and 1/2+     Skin: Intact without suspicious lesions or rashes. Lymph: No cervical, axillary lymphadenopathy present. Psych: Mood and affect are normal. Normally interactive  Assessment & Plan:  #1 Medicare Wellness Exam; criteria met ; data entered #2 Problem List/Diagnoses reviewed Plan:  Assessments made/ Orders entered  

## 2012-12-24 ENCOUNTER — Encounter (HOSPITAL_COMMUNITY): Payer: Self-pay | Admitting: Pharmacy Technician

## 2012-12-24 ENCOUNTER — Other Ambulatory Visit: Payer: Self-pay | Admitting: Neurosurgery

## 2012-12-27 ENCOUNTER — Telehealth: Payer: Self-pay | Admitting: Internal Medicine

## 2012-12-27 MED ORDER — PNEUMOCOCCAL VAC POLYVALENT 25 MCG/0.5ML IJ INJ: 0.5000 mL | INJECTION | Freq: Once | INTRAMUSCULAR | Status: DC

## 2012-12-27 NOTE — Telephone Encounter (Signed)
Patient left msg on triage line stating she would like [place] to send rx for pneumonia shot sent to [facility] [address]. She is going into the hospital for cervical spine fusion and would like to have this done before her surgery.

## 2012-12-27 NOTE — Telephone Encounter (Signed)
RX sent

## 2012-12-29 ENCOUNTER — Other Ambulatory Visit: Payer: Self-pay

## 2012-12-30 ENCOUNTER — Ambulatory Visit (HOSPITAL_COMMUNITY)
Admission: RE | Admit: 2012-12-30 | Discharge: 2012-12-30 | Disposition: A | Discharge status: Home / Self Care | Payer: Medicare Other | Source: Ambulatory Visit | Attending: Anesthesiology | Admitting: Anesthesiology

## 2012-12-30 ENCOUNTER — Encounter (HOSPITAL_COMMUNITY): Payer: Self-pay

## 2012-12-30 ENCOUNTER — Encounter (HOSPITAL_COMMUNITY)
Admission: RE | Admit: 2012-12-30 | Discharge: 2012-12-30 | Disposition: A | Discharge status: Home / Self Care | Payer: Medicare Other | Source: Ambulatory Visit | Attending: Neurosurgery | Admitting: Neurosurgery

## 2012-12-30 HISTORY — DX: Other specified postprocedural states: Z98.890

## 2012-12-30 HISTORY — DX: Nausea with vomiting, unspecified: R11.2

## 2012-12-30 HISTORY — DX: Multiple sclerosis, unspecified: G35.D

## 2012-12-30 HISTORY — DX: Headache: R51

## 2012-12-30 HISTORY — DX: Multiple sclerosis: G35

## 2012-12-30 LAB — CBC
Hemoglobin: 13.8 g/dL (ref 12.0–15.0)
Platelets: 274 10*3/uL (ref 150–400)
RBC: 4.06 MIL/uL (ref 3.87–5.11)
WBC: 7.1 10*3/uL (ref 4.0–10.5)

## 2012-12-30 LAB — BASIC METABOLIC PANEL
CO2: 27 mEq/L (ref 19–32)
Calcium: 9.6 mg/dL (ref 8.4–10.5)
Chloride: 96 mEq/L (ref 96–112)
Potassium: 4.3 mEq/L (ref 3.5–5.1)
Sodium: 135 mEq/L (ref 135–145)

## 2012-12-30 LAB — SURGICAL PCR SCREEN
MRSA, PCR: NEGATIVE
Staphylococcus aureus: NEGATIVE

## 2012-12-30 MED ORDER — VANCOMYCIN HCL IN DEXTROSE 1-5 GM/200ML-% IV SOLN
1000.0000 mg | INTRAVENOUS | Status: AC
  Administered 2012-12-31: 1000 mg via INTRAVENOUS
  Filled 2012-12-30 (×2): qty 200

## 2012-12-30 NOTE — Pre-Procedure Instructions (Signed)
Sherri [name]  12/30/2012   Your procedure is scheduled on:  Friday, August 8th  Report to [facility] at 0530 AM.  Call this number if you have problems the morning of surgery: [phone]   Remember:   Do not eat food or drink liquids after midnight.   Take these medicines the morning of surgery with A SIP OF WATER: ultram if needed   Do not wear jewelry, make-up or nail polish.  Do not wear lotions, powders, or perfume, deodorant.  Do not shave 48 hours prior to surgery. Men may shave face and neck.  Do not bring valuables to the hospital.  [facility] is not responsible for any belongings or valuables.  Contacts, dentures or bridgework may not be worn into surgery.  Leave suitcase in the car. After surgery it may be brought to your room.  For patients admitted to the hospital, checkout time is 11:00 AM the day of  discharge.   Patients discharged the day of surgery will not be allowed to drive home.   Special Instructions: Shower using CHG 2 nights before surgery and the night before surgery.  If you shower the day of surgery use CHG.  Use special wash - you have one bottle of CHG for all showers.  You should use approximately 1/3 of the bottle for each shower.   Please read over the following fact sheets that you were given: Pain Booklet, Coughing and Deep Breathing, MRSA Information and Surgical Site Infection Prevention

## 2012-12-30 NOTE — Progress Notes (Signed)
Primary physician - dr. Alwyn Ren No cardiologist June 2014 had ekg  No other cardiac testing

## 2012-12-31 ENCOUNTER — Inpatient Hospital Stay (HOSPITAL_COMMUNITY): Payer: Medicare Other

## 2012-12-31 ENCOUNTER — Encounter (HOSPITAL_COMMUNITY)
Admission: RE | Disposition: A | Discharge status: Home / Self Care | Payer: Self-pay | Source: Ambulatory Visit | Attending: Neurosurgery

## 2012-12-31 ENCOUNTER — Inpatient Hospital Stay (HOSPITAL_COMMUNITY)
Admission: RE | Admit: 2012-12-31 | Discharge: 2013-01-02 | DRG: 473 | Disposition: A | Discharge status: Home / Self Care | Payer: Medicare Other | Source: Ambulatory Visit | Attending: Neurosurgery | Admitting: Neurosurgery

## 2012-12-31 ENCOUNTER — Inpatient Hospital Stay (HOSPITAL_COMMUNITY): Payer: Medicare Other | Admitting: Anesthesiology

## 2012-12-31 ENCOUNTER — Encounter (HOSPITAL_COMMUNITY): Payer: Self-pay | Admitting: *Deleted

## 2012-12-31 ENCOUNTER — Encounter (HOSPITAL_COMMUNITY): Payer: Self-pay | Admitting: Anesthesiology

## 2012-12-31 DIAGNOSIS — Z981 Arthrodesis status: Secondary | ICD-10-CM

## 2012-12-31 DIAGNOSIS — M5030 Other cervical disc degeneration, unspecified cervical region: Secondary | ICD-10-CM | POA: Diagnosis present

## 2012-12-31 DIAGNOSIS — R339 Retention of urine, unspecified: Secondary | ICD-10-CM | POA: Diagnosis not present

## 2012-12-31 DIAGNOSIS — IMO0002 Reserved for concepts with insufficient information to code with codable children: Secondary | ICD-10-CM

## 2012-12-31 DIAGNOSIS — M4312 Spondylolisthesis, cervical region: Secondary | ICD-10-CM | POA: Diagnosis present

## 2012-12-31 DIAGNOSIS — N312 Flaccid neuropathic bladder, not elsewhere classified: Secondary | ICD-10-CM | POA: Diagnosis present

## 2012-12-31 DIAGNOSIS — Q762 Congenital spondylolisthesis: Principal | ICD-10-CM

## 2012-12-31 DIAGNOSIS — G35 Multiple sclerosis: Secondary | ICD-10-CM | POA: Diagnosis present

## 2012-12-31 DIAGNOSIS — Z96649 Presence of unspecified artificial hip joint: Secondary | ICD-10-CM

## 2012-12-31 DIAGNOSIS — Z79899 Other long term (current) drug therapy: Secondary | ICD-10-CM

## 2012-12-31 DIAGNOSIS — M810 Age-related osteoporosis without current pathological fracture: Secondary | ICD-10-CM | POA: Diagnosis present

## 2012-12-31 DIAGNOSIS — M069 Rheumatoid arthritis, unspecified: Secondary | ICD-10-CM | POA: Diagnosis present

## 2012-12-31 DIAGNOSIS — I10 Essential (primary) hypertension: Secondary | ICD-10-CM | POA: Diagnosis present

## 2012-12-31 DIAGNOSIS — Z87891 Personal history of nicotine dependence: Secondary | ICD-10-CM

## 2012-12-31 HISTORY — PX: POSTERIOR CERVICAL FUSION/FORAMINOTOMY: SHX5038

## 2012-12-31 HISTORY — PX: CERVICAL FUSION: SHX112

## 2012-12-31 SURGERY — POSTERIOR CERVICAL FUSION/FORAMINOTOMY LEVEL 1
Anesthesia: General | Wound class: Clean

## 2012-12-31 MED ORDER — ZOLPIDEM TARTRATE 5 MG PO TABS: 5.0000 mg | ORAL_TABLET | Freq: Every evening | ORAL | Status: DC | PRN

## 2012-12-31 MED ORDER — HYDROMORPHONE HCL PF 1 MG/ML IJ SOLN
INTRAMUSCULAR | Status: AC
  Filled 2012-12-31: qty 1

## 2012-12-31 MED ORDER — PROPOFOL 10 MG/ML IV BOLUS
INTRAVENOUS | Status: DC | PRN
  Administered 2012-12-31: 50 mg via INTRAVENOUS
  Administered 2012-12-31: 150 mg via INTRAVENOUS

## 2012-12-31 MED ORDER — LIDOCAINE HCL (CARDIAC) 20 MG/ML IV SOLN
INTRAVENOUS | Status: DC | PRN
  Administered 2012-12-31: 50 mg via INTRAVENOUS

## 2012-12-31 MED ORDER — PHENYLEPHRINE HCL 10 MG/ML IJ SOLN
INTRAMUSCULAR | Status: DC | PRN
  Administered 2012-12-31: 80 ug via INTRAVENOUS

## 2012-12-31 MED ORDER — ONDANSETRON HCL 4 MG/2ML IJ SOLN
4.0000 mg | Freq: Four times a day (QID) | INTRAMUSCULAR | Status: DC | PRN
  Administered 2013-01-01: 4 mg via INTRAVENOUS
  Filled 2012-12-31: qty 2

## 2012-12-31 MED ORDER — PREDNISONE 5 MG PO TABS
5.0000 mg | ORAL_TABLET | Freq: Every day | ORAL | Status: DC
  Administered 2012-12-31 – 2013-01-02 (×3): 5 mg via ORAL
  Filled 2012-12-31 (×3): qty 1

## 2012-12-31 MED ORDER — OXYCODONE HCL 5 MG/5ML PO SOLN: 5.0000 mg | Freq: Once | ORAL | Status: AC | PRN

## 2012-12-31 MED ORDER — ONDANSETRON HCL 4 MG/2ML IJ SOLN: 4.0000 mg | INTRAMUSCULAR | Status: DC | PRN

## 2012-12-31 MED ORDER — LACTATED RINGERS IV SOLN
INTRAVENOUS | Status: DC | PRN
  Administered 2012-12-31 (×2): via INTRAVENOUS

## 2012-12-31 MED ORDER — SODIUM CHLORIDE 0.9 % IV SOLN: 250.0000 mL | INTRAVENOUS | Status: DC

## 2012-12-31 MED ORDER — ACETAMINOPHEN 325 MG PO TABS: 650.0000 mg | ORAL_TABLET | ORAL | Status: DC | PRN

## 2012-12-31 MED ORDER — SODIUM CHLORIDE 0.9 % IV SOLN: INTRAVENOUS | Status: DC

## 2012-12-31 MED ORDER — SODIUM CHLORIDE 0.9 % IJ SOLN: 9.0000 mL | INTRAMUSCULAR | Status: DC | PRN

## 2012-12-31 MED ORDER — DIAZEPAM 5 MG PO TABS: 5.0000 mg | ORAL_TABLET | Freq: Four times a day (QID) | ORAL | Status: DC | PRN

## 2012-12-31 MED ORDER — WHITE PETROLATUM GEL
Status: AC
  Administered 2012-12-31: 0.2
  Filled 2012-12-31: qty 5

## 2012-12-31 MED ORDER — NEOSTIGMINE METHYLSULFATE 1 MG/ML IJ SOLN
INTRAMUSCULAR | Status: DC | PRN
  Administered 2012-12-31: 2.5 mg via INTRAVENOUS

## 2012-12-31 MED ORDER — OXYCODONE HCL 5 MG PO TABS
5.0000 mg | ORAL_TABLET | Freq: Once | ORAL | Status: AC | PRN
  Administered 2012-12-31: 5 mg via ORAL

## 2012-12-31 MED ORDER — MORPHINE SULFATE (PF) 1 MG/ML IV SOLN
INTRAVENOUS | Status: AC
  Filled 2012-12-31: qty 25

## 2012-12-31 MED ORDER — SODIUM CHLORIDE 0.9 % IJ SOLN: 3.0000 mL | INTRAMUSCULAR | Status: DC | PRN

## 2012-12-31 MED ORDER — LIDOCAINE HCL 4 % MT SOLN
OROMUCOSAL | Status: DC | PRN
  Administered 2012-12-31: 2 mL via TOPICAL

## 2012-12-31 MED ORDER — BUPIVACAINE-EPINEPHRINE 0.5% -1:200000 IJ SOLN
INTRAMUSCULAR | Status: DC | PRN
  Administered 2012-12-31: 10 mL

## 2012-12-31 MED ORDER — PHENOL 1.4 % MT LIQD: 1.0000 | OROMUCOSAL | Status: DC | PRN

## 2012-12-31 MED ORDER — HEMOSTATIC AGENTS (NO CHARGE) OPTIME
TOPICAL | Status: DC | PRN
  Administered 2012-12-31: 1 via TOPICAL

## 2012-12-31 MED ORDER — SODIUM CHLORIDE 0.9 % IJ SOLN: 3.0000 mL | Freq: Two times a day (BID) | INTRAMUSCULAR | Status: DC

## 2012-12-31 MED ORDER — GLYCOPYRROLATE 0.2 MG/ML IJ SOLN
INTRAMUSCULAR | Status: DC | PRN
  Administered 2012-12-31: .5 mg via INTRAVENOUS

## 2012-12-31 MED ORDER — OXYCODONE-ACETAMINOPHEN 5-325 MG PO TABS: 1.0000 | ORAL_TABLET | ORAL | Status: DC | PRN

## 2012-12-31 MED ORDER — ACETAMINOPHEN 650 MG RE SUPP: 650.0000 mg | RECTAL | Status: DC | PRN

## 2012-12-31 MED ORDER — 0.9 % SODIUM CHLORIDE (POUR BTL) OPTIME
TOPICAL | Status: DC | PRN
  Administered 2012-12-31: 1000 mL

## 2012-12-31 MED ORDER — FENTANYL CITRATE 0.05 MG/ML IJ SOLN
INTRAMUSCULAR | Status: DC | PRN
  Administered 2012-12-31 (×2): 50 ug via INTRAVENOUS
  Administered 2012-12-31: 100 ug via INTRAVENOUS
  Administered 2012-12-31: 50 ug via INTRAVENOUS

## 2012-12-31 MED ORDER — NALOXONE HCL 0.4 MG/ML IJ SOLN: 0.4000 mg | INTRAMUSCULAR | Status: DC | PRN

## 2012-12-31 MED ORDER — HYDROMORPHONE HCL PF 1 MG/ML IJ SOLN
0.2500 mg | INTRAMUSCULAR | Status: DC | PRN
  Administered 2012-12-31 (×4): 0.5 mg via INTRAVENOUS

## 2012-12-31 MED ORDER — OXYCODONE-ACETAMINOPHEN 5-325 MG PO TABS
1.0000 | ORAL_TABLET | ORAL | Status: DC | PRN
  Administered 2012-12-31 – 2013-01-01 (×3): 2 via ORAL
  Filled 2012-12-31 (×3): qty 2

## 2012-12-31 MED ORDER — ONDANSETRON HCL 4 MG/2ML IJ SOLN
4.0000 mg | INTRAMUSCULAR | Status: DC | PRN
  Administered 2013-01-01: 4 mg via INTRAVENOUS
  Filled 2012-12-31: qty 2

## 2012-12-31 MED ORDER — DIPHENHYDRAMINE HCL 50 MG/ML IJ SOLN: 12.5000 mg | Freq: Four times a day (QID) | INTRAMUSCULAR | Status: DC | PRN

## 2012-12-31 MED ORDER — HYDROXYCHLOROQUINE SULFATE 200 MG PO TABS
200.0000 mg | ORAL_TABLET | Freq: Every day | ORAL | Status: DC
  Administered 2012-12-31 – 2013-01-02 (×3): 200 mg via ORAL
  Filled 2012-12-31 (×3): qty 1

## 2012-12-31 MED ORDER — MORPHINE SULFATE (PF) 1 MG/ML IV SOLN
INTRAVENOUS | Status: DC
  Administered 2012-12-31: 9 mg via INTRAVENOUS
  Administered 2012-12-31 (×2): via INTRAVENOUS
  Administered 2013-01-01: 15 mg via INTRAVENOUS
  Administered 2013-01-01: 11:00:00 via INTRAVENOUS
  Filled 2012-12-31 (×2): qty 25

## 2012-12-31 MED ORDER — VANCOMYCIN HCL IN DEXTROSE 750-5 MG/150ML-% IV SOLN
750.0000 mg | INTRAVENOUS | Status: DC
  Administered 2013-01-01: 750 mg via INTRAVENOUS
  Filled 2012-12-31: qty 150

## 2012-12-31 MED ORDER — SENNA 8.6 MG PO TABS
1.0000 | ORAL_TABLET | Freq: Two times a day (BID) | ORAL | Status: DC
  Administered 2012-12-31 – 2013-01-02 (×4): 8.6 mg via ORAL
  Filled 2012-12-31 (×5): qty 1

## 2012-12-31 MED ORDER — SODIUM CHLORIDE 0.9 % IV SOLN
INTRAVENOUS | Status: DC
  Administered 2012-12-31: 15:00:00 via INTRAVENOUS

## 2012-12-31 MED ORDER — DIPHENHYDRAMINE HCL 12.5 MG/5ML PO ELIX: 12.5000 mg | ORAL_SOLUTION | Freq: Four times a day (QID) | ORAL | Status: DC | PRN

## 2012-12-31 MED ORDER — PROMETHAZINE HCL 25 MG/ML IJ SOLN: 6.2500 mg | INTRAMUSCULAR | Status: DC | PRN

## 2012-12-31 MED ORDER — RAMIPRIL 5 MG PO CAPS
5.0000 mg | ORAL_CAPSULE | Freq: Every day | ORAL | Status: DC
  Administered 2012-12-31 – 2013-01-02 (×2): 5 mg via ORAL
  Filled 2012-12-31 (×4): qty 1

## 2012-12-31 MED ORDER — MENTHOL 3 MG MT LOZG: 1.0000 | LOZENGE | OROMUCOSAL | Status: DC | PRN

## 2012-12-31 MED ORDER — DENOSUMAB 60 MG/ML ~~LOC~~ SOLN: 60.0000 mg | SUBCUTANEOUS | Status: DC

## 2012-12-31 MED ORDER — DEXAMETHASONE SODIUM PHOSPHATE 10 MG/ML IJ SOLN
INTRAMUSCULAR | Status: DC | PRN
  Administered 2012-12-31: 8 mg via INTRAVENOUS

## 2012-12-31 MED ORDER — MIDAZOLAM HCL 5 MG/5ML IJ SOLN
INTRAMUSCULAR | Status: DC | PRN
  Administered 2012-12-31: 2 mg via INTRAVENOUS

## 2012-12-31 MED ORDER — THROMBIN 5000 UNITS EX SOLR
CUTANEOUS | Status: DC | PRN
  Administered 2012-12-31 (×2): 5000 [IU] via TOPICAL

## 2012-12-31 MED ORDER — ONDANSETRON HCL 4 MG/2ML IJ SOLN
INTRAMUSCULAR | Status: DC | PRN
  Administered 2012-12-31: 4 mg via INTRAVENOUS

## 2012-12-31 MED ORDER — ROCURONIUM BROMIDE 100 MG/10ML IV SOLN
INTRAVENOUS | Status: DC | PRN
  Administered 2012-12-31: 10 mg via INTRAVENOUS
  Administered 2012-12-31: 50 mg via INTRAVENOUS
  Administered 2012-12-31 (×2): 10 mg via INTRAVENOUS
  Administered 2012-12-31: 5 mg via INTRAVENOUS

## 2012-12-31 MED ORDER — OXYCODONE HCL 5 MG PO TABS
ORAL_TABLET | ORAL | Status: AC
  Filled 2012-12-31: qty 1

## 2012-12-31 SURGICAL SUPPLY — 70 items
APL SKNCLS STERI-STRIP NONHPOA (GAUZE/BANDAGES/DRESSINGS) ×1
BENZOIN TINCTURE PRP APPL 2/3 (GAUZE/BANDAGES/DRESSINGS) ×1 IMPLANT
BLADE SURG 11 STRL SS (BLADE) IMPLANT
BLADE SURG ROTATE 9660 (MISCELLANEOUS) ×2 IMPLANT
BLADE ULTRA TIP 2M (BLADE) ×2 IMPLANT
BRUSH SCRUB EZ 1% IODOPHOR (MISCELLANEOUS) IMPLANT
BRUSH SCRUB EZ PLAIN DRY (MISCELLANEOUS) ×2 IMPLANT
BUR MATCHSTICK NEURO 3.0 LAGG (BURR) ×2 IMPLANT
CANISTER SUCTION 2500CC (MISCELLANEOUS) ×2 IMPLANT
CAP ELLIPSE LOCKING (Cap) ×4 IMPLANT
CLOTH BEACON ORANGE TIMEOUT ST (SAFETY) ×2 IMPLANT
CONT SPEC 4OZ CLIKSEAL STRL BL (MISCELLANEOUS) ×2 IMPLANT
COVER MAYO STAND STRL (DRAPES) IMPLANT
DECANTER SPIKE VIAL GLASS SM (MISCELLANEOUS) ×2 IMPLANT
DRAPE C-ARM 42X72 X-RAY (DRAPES) ×4 IMPLANT
DRAPE LAPAROTOMY 100X72 PEDS (DRAPES) ×2 IMPLANT
DRAPE MICROSCOPE LEICA (MISCELLANEOUS) IMPLANT
DRAPE ORTHO SPLIT 77X108 STRL (DRAPES)
DRAPE POUCH INSTRU U-SHP 10X18 (DRAPES) ×2 IMPLANT
DRAPE SURG ORHT 6 SPLT 77X108 (DRAPES) IMPLANT
DRSG OPSITE POSTOP 4X6 (GAUZE/BANDAGES/DRESSINGS) ×1 IMPLANT
DURAPREP 6ML APPLICATOR 50/CS (WOUND CARE) ×2 IMPLANT
ELECT REM PT RETURN 9FT ADLT (ELECTROSURGICAL) ×2
ELECTRODE REM PT RTRN 9FT ADLT (ELECTROSURGICAL) ×1 IMPLANT
EVACUATOR 1/8 PVC DRAIN (DRAIN) ×1 IMPLANT
GAUZE SPONGE 4X4 16PLY XRAY LF (GAUZE/BANDAGES/DRESSINGS) IMPLANT
GLOVE BIOGEL M 8.0 STRL (GLOVE) ×2 IMPLANT
GLOVE BIOGEL PI IND STRL 8 (GLOVE) IMPLANT
GLOVE BIOGEL PI INDICATOR 8 (GLOVE) ×2
GLOVE ECLIPSE 7.5 STRL STRAW (GLOVE) ×1 IMPLANT
GLOVE ECLIPSE 8.0 STRL XLNG CF (GLOVE) ×1 IMPLANT
GLOVE ECLIPSE 9.0 STRL (GLOVE) ×1 IMPLANT
GLOVE EXAM NITRILE LRG STRL (GLOVE) ×1 IMPLANT
GLOVE EXAM NITRILE MD LF STRL (GLOVE) IMPLANT
GLOVE EXAM NITRILE XL STR (GLOVE) IMPLANT
GLOVE EXAM NITRILE XS STR PU (GLOVE) IMPLANT
GLOVE INDICATOR 8.0 STRL GRN (GLOVE) ×3 IMPLANT
GOWN BRE IMP SLV AUR LG STRL (GOWN DISPOSABLE) ×2 IMPLANT
GOWN BRE IMP SLV AUR XL STRL (GOWN DISPOSABLE) IMPLANT
GOWN STRL REIN 2XL LVL4 (GOWN DISPOSABLE) IMPLANT
KIT BASIN OR (CUSTOM PROCEDURE TRAY) ×2 IMPLANT
KIT ROOM TURNOVER OR (KITS) ×2 IMPLANT
NDL HYPO 18GX1.5 BLUNT FILL (NEEDLE) IMPLANT
NEEDLE HYPO 18GX1.5 BLUNT FILL (NEEDLE) IMPLANT
NS IRRIG 1000ML POUR BTL (IV SOLUTION) ×2 IMPLANT
PACK FOAM VITOSS 5CC (Orthopedic Implant) ×1 IMPLANT
PACK LAMINECTOMY NEURO (CUSTOM PROCEDURE TRAY) ×2 IMPLANT
PAD ARMBOARD 7.5X6 YLW CONV (MISCELLANEOUS) ×6 IMPLANT
PATTIES SURGICAL .25X.25 (GAUZE/BANDAGES/DRESSINGS) IMPLANT
PIN MAYFIELD SKULL DISP (PIN) ×2 IMPLANT
ROD ELLIPSE 40MMX3.5 (Rod) ×2 IMPLANT
RUBBERBAND STERILE (MISCELLANEOUS) ×1 IMPLANT
SCREW 3.5X12MM (Screw) ×3 IMPLANT
SCREW POLYAXIAL ELLIPSE 4X12 (Screw) ×1 IMPLANT
SPONGE GAUZE 4X4 12PLY (GAUZE/BANDAGES/DRESSINGS) ×1 IMPLANT
SPONGE LAP 4X18 X RAY DECT (DISPOSABLE) IMPLANT
STAPLER SKIN PROX WIDE 3.9 (STAPLE) IMPLANT
STRIP CLOSURE SKIN 1/2X4 (GAUZE/BANDAGES/DRESSINGS) ×1 IMPLANT
SUT ETHILON 3 0 FSL (SUTURE) IMPLANT
SUT ETHILON 4 0 PS 2 18 (SUTURE) IMPLANT
SUT VIC AB 0 CT1 18XCR BRD 8 (SUTURE) IMPLANT
SUT VIC AB 0 CT1 8-18 (SUTURE) ×2
SUT VIC AB 2-0 CP2 18 (SUTURE) ×1 IMPLANT
SUT VIC AB 3-0 SH 8-18 (SUTURE) ×2 IMPLANT
SYR 20ML ECCENTRIC (SYRINGE) ×2 IMPLANT
SYR 3ML LL SCALE MARK (SYRINGE) IMPLANT
TOWEL OR 17X24 6PK STRL BLUE (TOWEL DISPOSABLE) ×2 IMPLANT
TOWEL OR 17X26 10 PK STRL BLUE (TOWEL DISPOSABLE) ×2 IMPLANT
UNDERPAD 30X30 INCONTINENT (UNDERPADS AND DIAPERS) IMPLANT
WATER STERILE IRR 1000ML POUR (IV SOLUTION) ×2 IMPLANT

## 2012-12-31 NOTE — H&P (Signed)
Sherri [name] is an 72 y.o. female.   Chief Complaint:PAIN RIGHT ARM ZOX:WRUEAVW complaining of neck pain with radiation to the right shoulder  And lately to the left. She did have an EMG and NCV which were negative. 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  Past Medical History  Diagnosis Date  . Nonspecific abnormal electrocardiogram (ECG) (EKG)     EKG is normal but reveals early repolarization ST-T changes.  . Rheumatoid arthritis(714.0)     Dr Corliss Skains   . OA (osteoarthritis)     Dr Charlett Blake  . Neuromuscular disorder     Dr. [place]- in [place]- [facility] follows [facility]  . Hypertension     stress test many yrs. ago  . GERD (gastroesophageal reflux disease)     +reflux, on occas, no Rx  . Cancer     basal cell ca, in situ- uterine   . PONV (postoperative nausea and vomiting)   . Multiple sclerosis   . UJWJXBJY(782.9)     Past Surgical History  Procedure Laterality Date  . Lumbar fusion      Dr Jeral Fruit  . Appendectomy      with TAH  . Cervical fusion      Dr Jeral Fruit  . Carpal tunnel release    . Chest tube insertion      for traumatic Pneumothorax  . Colonoscopy with polypectomy  1997    negative since; Dr Arlyce Dice  . Thyroid surgery      R lobe removed- 1972  . Abdominal hysterectomy      1985  . Nasal sinus surgery    . 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 Cancer Mother     pancreatic  . Diabetes Mother   . Heart disease Father     Rheumatic  . Cancer Father     ? stomach  . Cancer Sister     stomach  . Cancer Maternal Aunt     X 4; ? primary  . Heart disease Paternal Aunt   . Hypertension Neg Hx   . Stroke Neg Hx   . Cancer Maternal Grandmother     cervical   Social History:  reports that she quit smoking about 31 years ago. She does not have any smokeless  tobacco history on file. She reports that she drinks about 4.2 ounces of alcohol per week. She reports that she does not use illicit drugs.  Allergies:  Allergies  Allergen Reactions  . Meperidine Hcl Shortness Of Breath  . Penicillins Hives  . Azathioprine Other (See Comments)    Weakness  . Darvon (Propoxyphene Hcl)     Lowered respirations greatly  . Ezetimibe-Simvastatin Nausea And Vomiting    Medications Prior to Admission  Medication Sig Dispense Refill  . Biotin 5000 MCG TABS Take 5,000 mcg by mouth daily.       . Calcium Carbonate-Vitamin D (CALCIUM + D PO) Take 1 tablet by mouth daily. 1200 mg daily      . carboxymethylcellulose (REFRESH PLUS) 0.5 % SOLN Place 1 drop into both eyes 2 (two) times daily.      . Cyanocobalamin (VITAMIN B-12 PO) Take 1 tablet by mouth daily.      . diazepam (VALIUM) 5 MG tablet Take 5 mg by mouth  at bedtime.       . folic acid (FOLVITE) 1 MG tablet Take 1 mg by mouth 2 (two) times daily.      . furosemide (LASIX) 20 MG tablet TAKE 1 TABLET BY MOUTH EVERY DAY  90 tablet  3  . hydroxychloroquine (PLAQUENIL) 200 MG tablet Take 200 mg by mouth daily.       . Methotrexate Sodium, PF, 50 MG/2ML SOLN Inject 25 mg into the muscle once a week. Every Tuesday injects into thigh      . Omega-3 Fatty Acids (OMEGA 3 PO) Take 1,000 mg by mouth daily.       . pneumococcal 23 valent vaccine (PNEUMOVAX 23) 25 MCG/0.5ML injection Inject 0.5 mLs into the muscle once.  0.5 mL  0  . predniSONE (DELTASONE) 5 MG tablet Take 5 mg by mouth daily.       . Pyridoxine HCl (VITAMIN B-6 PO) Take 1 tablet by mouth daily.      . ramipril (ALTACE) 5 MG capsule Take 1 capsule (5 mg total) by mouth daily before breakfast. Office visit due  90 capsule  3  . traMADol (ULTRAM) 50 MG tablet Take 50-100 mg by mouth every 4 (four) hours. Maximum dose= 8 tablets per day      . denosumab (PROLIA) 60 MG/ML SOLN injection Inject 60 mg into the skin every 6 (six) months. Administer in upper  arm, thigh, or abdomen        Results for orders placed during the hospital encounter of 12/30/12 (from the past 48 hour(s))  SURGICAL PCR SCREEN     Status: None   Collection Time    12/30/12  2:34 PM      Result Value Range   MRSA, PCR NEGATIVE  NEGATIVE   Staphylococcus aureus NEGATIVE  NEGATIVE   Comment:            The Xpert SA Assay (FDA     approved for NASAL specimens     in patients over 4 years of age),     is one component of     a comprehensive surveillance     program.  Test performance has     been validated by [employer] for patients greater     than or equal to 96 year old.     It is not intended     to diagnose infection nor to     guide or monitor treatment.  CBC     Status: None   Collection Time    12/30/12  2:34 PM      Result Value Range   WBC 7.1  4.0 - 10.5 K/uL   RBC 4.06  3.87 - 5.11 MIL/uL   Hemoglobin 13.8  12.0 - 15.0 g/dL   HCT 16.1  09.6 - 04.5 %   MCV 96.8  78.0 - 100.0 fL   MCH 34.0  26.0 - 34.0 pg   MCHC 35.1  30.0 - 36.0 g/dL   RDW 40.9  81.1 - 91.4 %   Platelets 274  150 - 400 K/uL  BASIC METABOLIC PANEL     Status: Abnormal   Collection Time    12/30/12  2:34 PM      Result Value Range   Sodium 135  135 - 145 mEq/L   Potassium 4.3  3.5 - 5.1 mEq/L   Chloride 96  96 - 112 mEq/L   CO2 27  19 - 32 mEq/L  Glucose, Bld 97  70 - 99 mg/dL   BUN 11  6 - 23 mg/dL   Creatinine, Ser 4.09  0.50 - 1.10 mg/dL   Calcium 9.6  8.4 - 81.1 mg/dL   GFR calc non Af Amer 83 (*) >90 mL/min   GFR calc Af Amer >90  >90 mL/min   Comment:            The eGFR has been calculated     using the CKD EPI equation.     This calculation has not been     validated in all clinical     situations.     eGFR's persistently     <90 mL/min signify     possible Chronic Kidney Disease.   Dg Chest 2 View  12/30/2012   *RADIOLOGY REPORT*  Clinical Data: Preop for cervical surgery, hypertension  CHEST - 2 VIEW  Comparison: 12/16/2011  Findings:  Cardiomediastinal silhouette is stable.  No acute infiltrate or pleural effusion.  No pulmonary edema.  Stable chronic minimal central bronchitic changes.  Metallic fixation plate cervical spine again noted. Bony thorax is unremarkable.  IMPRESSION: No active disease.  No significant change.   Original Report Authenticated By: Natasha Mead, M.D.    Review of Systems  HENT: Positive for neck pain.   Eyes: Negative.   Respiratory: Negative.   Cardiovascular: Negative.   Gastrointestinal: Negative.   Genitourinary: Negative.   Skin: Negative.   Neurological: Negative.        MS  Endo/Heme/Allergies: Negative.     Blood pressure 119/71, pulse 74, temperature 98.2 F (36.8 C), temperature source Oral, resp. rate 16, SpO2 98.00%. Physical Exam hent, nl. Neck, anterior scar with decrease of mobility.cv, nl. Lungs, clear. Abdomen, soft. Extremities nl. NEURO weakness of hypothenar muscles. Dtr, nl. Emg, nl. Myelogram stenosis at c34  Assessment/Plan  posterior decompression and fusion. She is aware of risks and benefits  Nahshon Reich M 12/31/2012, 7:45 AM

## 2012-12-31 NOTE — Anesthesia Postprocedure Evaluation (Signed)
  Anesthesia Post-op Note  Patient: Sherri [name]  Procedure(s) Performed: Procedure(s): POSTERIOR LATERAL CERVICAL FUSION/FORAMINOTOMY LEVEL 1 CERVICAL THREE-FOUR WITH LATERAL MASS SCREWS (N/A)  Patient Location: PACU  Anesthesia Type:General  Level of Consciousness: awake and alert   Airway and Oxygen Therapy: Patient Spontanous Breathing  Post-op Pain: mild  Post-op Assessment: Post-op Vital signs reviewed  Post-op Vital Signs: stable  Complications: No apparent anesthesia complications

## 2012-12-31 NOTE — Anesthesia Preprocedure Evaluation (Addendum)
Anesthesia Evaluation  Patient identified by MRN, date of birth, ID band  History of Anesthesia Complications (+) PONV  Airway Mallampati: II  Neck ROM: Limited    Dental   Pulmonary neg pulmonary ROS,  breath sounds clear to auscultation        Cardiovascular hypertension, Rhythm:Regular Rate:Normal     Neuro/Psych  Headaches, MS  Neuromuscular disease    GI/Hepatic GERD-  ,  Endo/Other    Renal/GU      Musculoskeletal  (+) Arthritis -, Rheumatoid disorders,    Abdominal   Peds  Hematology   Anesthesia Other Findings   Reproductive/Obstetrics                          Anesthesia Physical Anesthesia Plan  ASA: III  Anesthesia Plan: General   Post-op Pain Management:    Induction: Intravenous  Airway Management Planned: Oral ETT and Video Laryngoscope Planned  Additional Equipment:   Intra-op Plan:   Post-operative Plan: Extubation in OR  Informed Consent: I have reviewed the patients History and Physical, chart, labs and discussed the procedure including the risks, benefits and alternatives for the proposed anesthesia with the patient or authorized representative who has indicated his/her understanding and acceptance.   Dental advisory given  Plan Discussed with: CRNA and Surgeon  Anesthesia Plan Comments:        Anesthesia Quick Evaluation

## 2012-12-31 NOTE — Progress Notes (Signed)
Patient unable to void 3 hours after arriving to floor from surgery. Patient bladder scanned >999. Straight cath of clear yellow urine. Patient states this happens from time to time at her doctors office as well secondary to her MS. Will continue to closely monitor.

## 2012-12-31 NOTE — Progress Notes (Signed)
UR COMPLETED  

## 2012-12-31 NOTE — Progress Notes (Signed)
ANTIBIOTIC CONSULT NOTE - INITIAL  Pharmacy Consult for vancomycin Indication: post-op prophylaxis  Allergies  Allergen Reactions  . Meperidine Hcl Shortness Of Breath  . Penicillins Hives  . Azathioprine Other (See Comments)    Weakness  . Darvon (Propoxyphene Hcl)     Lowered respirations greatly  . Ezetimibe-Simvastatin Nausea And Vomiting    Patient Measurements: Height: 5\' 2"  (157.5 cm) Weight: 111 lb 4.8 oz (50.485 kg) IBW/kg (Calculated) : 50.1  Vital Signs: Temp: 99.1 F (37.3 C) (08/08 1310) Temp src: Oral (08/08 0628) BP: 140/84 mmHg (08/08 1300) Pulse Rate: 78 (08/08 1300) Intake/Output from previous day:   Intake/Output from this shift: Total I/O In: 1100 [I.V.:1100] Out: 40 [Blood:40]  Labs:  Recent Labs  12/30/12 1434  WBC 7.1  HGB 13.8  PLT 274  CREATININE 0.76   Estimated Creatinine Clearance: 51.8 ml/min (by C-G formula based on Cr of 0.76). No results found for this basename: VANCOTROUGH, Leodis Binet, VANCORANDOM, GENTTROUGH, GENTPEAK, GENTRANDOM, TOBRATROUGH, TOBRAPEAK, TOBRARND, AMIKACINPEAK, AMIKACINTROU, AMIKACIN,  in the last 72 hours   Microbiology: Recent Results (from the past 720 hour(s))  SURGICAL PCR SCREEN     Status: None   Collection Time    12/30/12  2:34 PM      Result Value Range Status   MRSA, PCR NEGATIVE  NEGATIVE Final   Staphylococcus aureus NEGATIVE  NEGATIVE Final   Comment:            The Xpert SA Assay (FDA     approved for NASAL specimens     in patients over 74 years of age),     is one component of     a comprehensive surveillance     program.  Test performance has     been validated by [employer] for patients greater     than or equal to 24 year old.     It is not intended     to diagnose infection nor to     guide or monitor treatment.    Medical History: Past Medical History  Diagnosis Date  . Nonspecific abnormal electrocardiogram (ECG) (EKG)     EKG is normal but reveals early  repolarization ST-T changes.  . Rheumatoid arthritis(714.0)     Dr Corliss Skains   . OA (osteoarthritis)     Dr Charlett Blake  . Neuromuscular disorder     Dr. Juneau Nation- in [place]- [facility] follows [facility]  . Hypertension     stress test many yrs. ago  . GERD (gastroesophageal reflux disease)     +reflux, on occas, no Rx  . Cancer     basal cell ca, in situ- uterine   . PONV (postoperative nausea and vomiting)   . Multiple sclerosis   . Headache(784.0)     Medications:  Prescriptions prior to admission  Medication Sig Dispense Refill  . Biotin 5000 MCG TABS Take 5,000 mcg by mouth daily.       . Calcium Carbonate-Vitamin D (CALCIUM + D PO) Take 1 tablet by mouth daily. 1200 mg daily      . carboxymethylcellulose (REFRESH PLUS) 0.5 % SOLN Place 1 drop into both eyes 2 (two) times daily.      . Cyanocobalamin (VITAMIN B-12 PO) Take 1 tablet by mouth daily.      . diazepam (VALIUM) 5 MG tablet Take 5 mg by mouth at bedtime.       . folic acid (FOLVITE) 1 MG tablet Take 1 mg by mouth  2 (two) times daily.      . furosemide (LASIX) 20 MG tablet TAKE 1 TABLET BY MOUTH EVERY DAY  90 tablet  3  . hydroxychloroquine (PLAQUENIL) 200 MG tablet Take 200 mg by mouth daily.       . Methotrexate Sodium, PF, 50 MG/2ML SOLN Inject 25 mg into the muscle once a week. Every Tuesday injects into thigh      . Omega-3 Fatty Acids (OMEGA 3 PO) Take 1,000 mg by mouth daily.       . pneumococcal 23 valent vaccine (PNEUMOVAX 23) 25 MCG/0.5ML injection Inject 0.5 mLs into the muscle once.  0.5 mL  0  . predniSONE (DELTASONE) 5 MG tablet Take 5 mg by mouth daily.       . Pyridoxine HCl (VITAMIN B-6 PO) Take 1 tablet by mouth daily.      . ramipril (ALTACE) 5 MG capsule Take 1 capsule (5 mg total) by mouth daily before breakfast. Office visit due  90 capsule  3  . traMADol (ULTRAM) 50 MG tablet Take 50-100 mg by mouth every 4 (four) hours. Maximum dose= 8 tablets per day      . denosumab (PROLIA) 60 MG/ML SOLN injection  Inject 60 mg into the skin every 6 (six) months. Administer in upper arm, thigh, or abdomen       Assessment: 72 y/o female to begin vancomycin for post-op prophylaxis s/p posterior cervical fusion. Pre-op vancomycin 1000 mg IV at 07:51 this morning. She is afebrile, WBC are wnl, and no culture data is available.  Goal of Therapy:  Vancomycin trough level 10-15 mcg/ml  Plan:  -Vancomycin 750 mg IV q24h - 1st dose 8/9 07:00 -Monitor renal function and clinical course  [facility], [address].D., BCPS Clinical Pharmacist Pager: [phone] 12/31/2012 1:40 PM

## 2012-12-31 NOTE — Transfer of Care (Signed)
Immediate Anesthesia Transfer of Care Note  Patient: Sherri [name]  Procedure(s) Performed: Procedure(s): POSTERIOR LATERAL CERVICAL FUSION/FORAMINOTOMY LEVEL 1 CERVICAL THREE-FOUR WITH LATERAL MASS SCREWS (N/A)  Patient Location: PACU  Anesthesia Type:General  Level of Consciousness: awake, alert  and oriented  Airway & Oxygen Therapy: Patient Spontanous Breathing  Post-op Assessment: Report given to PACU RN  Post vital signs: Reviewed and stable  Complications: No apparent anesthesia complications

## 2012-12-31 NOTE — Preoperative (Signed)
Beta Blockers   Reason not to administer Beta Blockers:Not Applicable 

## 2012-12-31 NOTE — Progress Notes (Signed)
Op note [phone]

## 2013-01-01 DIAGNOSIS — M4312 Spondylolisthesis, cervical region: Secondary | ICD-10-CM | POA: Diagnosis present

## 2013-01-01 MED ORDER — OXYCODONE-ACETAMINOPHEN 5-325 MG PO TABS
1.0000 | ORAL_TABLET | ORAL | Status: DC | PRN
  Administered 2013-01-01 – 2013-01-02 (×5): 2 via ORAL
  Filled 2013-01-01 (×5): qty 2

## 2013-01-01 NOTE — Evaluation (Signed)
Occupational Therapy Evaluation Patient Details Name: Sherri [name] MRN: [id number] DOB: [birth year] Today's Date: 01/01/2013 Time: 4098-1191 OT Time Calculation (min): 31 min  OT Assessment / Plan / Recommendation History of present illness patient complaining of neck pain with radiation to the right shoulder  And lately to the left. She did have an EMG and NCV which were negative. Myelogram showed a fusion from c4 to ct with stenosis at c3-4., s/p C3/4 PLA , lami, foraminectomy   Clinical Impression   Pt s/p posterior lateral cervical fusion C3-4.  Will continue to follow pt acutely in order to address below problem list in prep for return home with family.    OT Assessment  Patient needs continued OT Services    Follow Up Recommendations  No OT follow up;Supervision/Assistance - 24 hour    Barriers to Discharge      Equipment Recommendations  None recommended by OT    Recommendations for Other Services    Frequency  Min 2X/week    Precautions / Restrictions Precautions Precautions: Cervical Required Braces or Orthoses: Cervical Brace Cervical Brace: At all times   Pertinent Vitals/Pain See vitals    ADL  Eating/Feeding: Performed;Set up Where Assessed - Eating/Feeding: Edge of bed Grooming: Performed;Teeth care;Wash/dry face;Wash/dry hands;Supervision/safety Where Assessed - Grooming: Unsupported standing Upper Body Bathing: Simulated;Supervision/safety Where Assessed - Upper Body Bathing: Unsupported sitting Lower Body Bathing: Simulated;Supervision/safety Where Assessed - Lower Body Bathing: Unsupported sit to stand Upper Body Dressing: Performed;Supervision/safety Where Assessed - Upper Body Dressing: Unsupported sitting Lower Body Dressing: Performed;Supervision/safety Where Assessed - Lower Body Dressing: Unsupported sit to stand Toilet Transfer: Simulated;Supervision/safety Toilet Transfer Method:  (ambulation) Acupuncturist:  (bed) Equipment  Used:  (cervical collar) Transfers/Ambulation Related to ADLs: supervision ambulating in room and hall ADL Comments: Pt able to cross ankles over knees to donn socks but requires incr time due to "feeling awkward" with cervical collar and foley.    OT Diagnosis: Generalized weakness;Acute pain  OT Problem List: Decreased strength;Decreased activity tolerance;Impaired balance (sitting and/or standing);Decreased knowledge of precautions;Pain OT Treatment Interventions: Self-care/ADL training;DME and/or AE instruction;Therapeutic activities;Patient/family education;Balance training   OT Goals(Current goals can be found in the care plan section) Acute Rehab OT Goals Patient Stated Goal: Home, independent, eventually back to my normal activities OT Goal Formulation: With patient Time For Goal Achievement: 01/08/13 Potential to Achieve Goals: Good  Visit Information  Last OT Received On: 01/01/13 Assistance Needed: +1 PT/OT Co-Evaluation/Treatment: Yes History of Present Illness: patient complaining of neck pain with radiation to the right shoulder  And lately to the left. She did have an EMG and NCV which were negative. Myelogram showed a fusion from c4 to ct with stenosis at c3-4., s/p C3/4 PLA , lami, foraminectomy       Prior Functioning     Home Living Family/patient expects to be discharged to:: Private residence Living Arrangements: Spouse/significant other Available Help at Discharge: Family;Available 24 hours/day Type of Home: House Home Access: Stairs to enter Home Layout: One level Home Equipment: Bedside commode;Shower seat - built in Prior Function Level of Independence: Independent Communication Communication: No difficulties Dominant Hand: Right         Vision/Perception     Cognition  Cognition Arousal/Alertness: Awake/alert Behavior During Therapy: WFL for tasks assessed/performed Overall Cognitive Status: Within Functional Limits for tasks assessed     Extremity/Trunk Assessment Upper Extremity Assessment Upper Extremity Assessment: Overall WFL for tasks assessed Lower Extremity Assessment Lower Extremity Assessment: Overall WFL for tasks assessed Cervical /  Trunk Assessment Cervical / Trunk Assessment: Normal     Mobility Bed Mobility Bed Mobility: Not assessed Transfers Transfers: Sit to Stand;Stand to Sit Sit to Stand: From bed;5: Supervision Stand to Sit: 5: Supervision;To chair/3-in-1 Details for Transfer Assistance: safe mobility     Exercise     Balance Balance Balance Assessed: Yes Static Standing Balance Static Standing - Balance Support: Bilateral upper extremity supported Static Standing - Level of Assistance: 5: Stand by assistance   End of Session OT - End of Session Equipment Utilized During Treatment: Cervical collar Activity Tolerance: Patient tolerated treatment well Patient left: in chair;with call bell/phone within reach;with family/visitor present Nurse Communication: Mobility status  GO    01/01/2013 Cipriano Mile OTR/L Pager [phone] Office [phone]  Cipriano Mile 01/01/2013, 4:46 PM

## 2013-01-01 NOTE — Op Note (Signed)
NAMEJENESA, FORESTA             ACCOUNT NO.:  [id number]  MEDICAL RECORD NO.:  [id number]  LOCATION:  [id number]                        FACILITY:  [facility]  PHYSICIAN:  Hilda Lias, M.D.   DATE OF BIRTH:  [birth year]  DATE OF PROCEDURE:  12/31/2012 DATE OF DISCHARGE:                              OPERATIVE REPORT   PREOPERATIVE DIAGNOSIS:  C3-C4 stenosis with spondylolisthesis, degenerative disk disease, status post fusion from C4-C7.  POSTOPERATIVE DIAGNOSIS:  C3-C4 stenosis with spondylolisthesis, degenerative disk disease, status post fusion from C4-C7.  PROCEDURE:  Bilateral C3-C4 laminectomy, foraminotomy, lateral mass screws from C3-C4, posterolateral arthrodesis with VITOSS and autograft.  SURGEON:  Hilda Lias, M.D.  ASSISTANT:  Hewitt Shorts, M.D.  CLINICAL HISTORY:  Ms. Pinard is a lady who has a history of multiple sclerosis, osteoporosis, chronic intake of steroid, who underwent surgery from C4-C7 many years ago.  Now she is complaining of neck pain with movement associated with radiation to the shoulder.  X-ray shows stenosis at the C3-C4 with spondylolisthesis.  Surgery was advised.  She knew about the risks and benefits of the surgery.  PROCEDURE IN DETAIL:  The patient was taken to the OR, and after intubation 3 pins head holder was applied to the head.  She was positioned in a prone manner.  The neck was cleaned with Betadine and DuraPrep.  Drapes were applied.  Then, midline incision from C2 down to C4-C5 was made and muscle were retracted laterally.  Retraction was done.  At the point, we were able to see the lateral aspect of the facet of C3 and C4.  From then on, we removed the spinous process of C3-C4, which was quite osteoporotic.  The lamina were removed also using the 1 and 2 mm Kerrison punch.  Foraminotomy to decompress the C3 and C4 nerve root was done bilaterally.  Then, using the C-arm in a lateral view, we selected an point in the facet and  a hole was drilled into the lateral mass at 12 mm.  This procedure was done in the facet of C3 and C4 bilaterally.  Prior to introduction of the screws, we verified the position with the C-arm as well as the hole was surrounded by bone. There was a step-off up to the point that the facet of C4 in the left side was lower than in the right side.  At the end, we were able to introduce 4 screws of 12 mm.  Good position was achieved.  Then, a rod from C3-C4 bilaterally was used and the caps to keep the screws in place.  Then with the drill, we drilled the lateral aspect of the facet of C3 and C4 and a mix of VITOSS and autograft was used for arthrodesis. Drain was left in the epidural space and the wound was closed with Vicryl and Steri-Strips.          ______________________________ Hilda Lias, M.D.     EB/MEDQ  D:  12/31/2012  T:  01/01/2013  Job:  [id number]

## 2013-01-01 NOTE — Progress Notes (Addendum)
Subjective: Patient reports Patient is postop day 1 from posterior cervical decompression and fusion C3-C4. She feels reasonably comfortable. She has an atonic bladder. She requires a Foley catheter.  Objective: Vital signs in last 24 hours: Temp:  [97.7 F (36.5 C)-98.4 F (36.9 C)] 97.7 F (36.5 C) (08/09 0936) Pulse Rate:  [76-85] 77 (08/09 0936) Resp:  [10-18] 16 (08/09 0936) BP: (116-145)/(52-68) 116/52 mmHg (08/09 0936) SpO2:  [93 %-99 %] 93 % (08/09 0936)  Intake/Output from previous day: 08/08 0701 - 08/09 0700 In: 1100 [I.V.:1100] Out: 3220 [Urine:3100; Drains:80; Blood:40] Intake/Output this shift: Total I/O In: -  Out: 1000 [Urine:1000]  Incision is clean and dry motor function is good in the upper extremities.  Lab Results:  Recent Labs  12/30/12 1434  WBC 7.1  HGB 13.8  HCT 39.3  PLT 274   BMET  Recent Labs  12/30/12 1434  NA 135  K 4.3  CL 96  CO2 27  GLUCOSE 97  BUN 11  CREATININE 0.76  CALCIUM 9.6    Studies/Results: Dg Chest 2 View  12/30/2012   *RADIOLOGY REPORT*  Clinical Data: Preop for cervical surgery, hypertension  CHEST - 2 VIEW  Comparison: 12/16/2011  Findings: Cardiomediastinal silhouette is stable.  No acute infiltrate or pleural effusion.  No pulmonary edema.  Stable chronic minimal central bronchitic changes.  Metallic fixation plate cervical spine again noted. Bony thorax is unremarkable.  IMPRESSION: No active disease.  No significant change.   Original Report Authenticated By: Natasha Mead, M.D.   Dg Cervical Spine 2-3 Views  12/31/2012   *RADIOLOGY REPORT*  Clinical Data: Posterior lateral cervical fusion.  DG C-ARM 1-60 MIN,CERVICAL SPINE - 2-3 VIEW  Technique: Three intraoperative fluoroscopic spot films.  Comparison:  Cervical spine MRI 11/09/2012.  Findings: The ACDF has been augmented with posterior screw fixation at C3-C4.  Soft tissue retractors are present in the dorsal operative approach.  Endotracheal tube is present.   IMPRESSION: Posterior C3-C4 fusion.   Original Report Authenticated By: Andreas Newport, M.D.   Dg C-arm 1-60 Min  12/31/2012   *RADIOLOGY REPORT*  Clinical Data: Posterior lateral cervical fusion.  DG C-ARM 1-60 MIN,CERVICAL SPINE - 2-3 VIEW  Technique: Three intraoperative fluoroscopic spot films.  Comparison:  Cervical spine MRI 11/09/2012.  Findings: The ACDF has been augmented with posterior screw fixation at C3-C4.  Soft tissue retractors are present in the dorsal operative approach.  Endotracheal tube is present.  IMPRESSION: Posterior C3-C4 fusion.   Original Report Authenticated By: Andreas Newport, M.D.    Assessment/Plan: Stable postop day 1, urinary retention.  LOS: 1 day  Replace Foley cath, DC PCA, remove the drain from back of neck.   Ka Bench J 01/01/2013, 1:53 PM

## 2013-01-01 NOTE — Progress Notes (Signed)
Pt c/o increased pressure/ pain in bladder. Upon assessment bladder hard to touch and distended. Bladder scan showed >999 cc. I&O cath preformed with a result of 1600 cc of clear yellow urine. Pt states "that is instant relief." Will continue to monitor.

## 2013-01-01 NOTE — Evaluation (Signed)
Physical Therapy Evaluation Patient Details Name: Sherri [name] MRN: [id number] DOB: [birth year] Today's Date: 01/01/2013 Time: 9562-1308 PT Time Calculation (min): 31 min  PT Assessment / Plan / Recommendation History of Present Illness  patient complaining of neck pain with radiation to the right shoulder  And lately to the left. She did have an EMG and NCV which were negative. Myelogram showed a fusion from c4 to ct with stenosis at c3-4., s/p C3/4 PLA , lami, foraminectomy  Clinical Impression  Patient is s/p C3/4 PLA surgery resulting in functional limitations due to the deficits listed below (see PT Problem List). We initiated cervical care and precautions. Patient will benefit from skilled PT to increase their independence and safety with mobility to allow discharge to home with available assist.      PT Assessment  Patient needs continued PT services    Follow Up Recommendations  No PT follow up;Supervision for mobility/OOB    Does the patient have the potential to tolerate intense rehabilitation      Barriers to Discharge        Equipment Recommendations  None recommended by PT    Recommendations for Other Services     Frequency Min 5X/week    Precautions / Restrictions Precautions Precautions: Cervical Required Braces or Orthoses: Cervical Brace Cervical Brace: At all times   Pertinent Vitals/Pain       Mobility  Bed Mobility Bed Mobility: Not assessed Transfers Transfers: Sit to Stand;Stand to Sit Sit to Stand: 5: Supervision;From bed Stand to Sit: 5: Supervision;To chair/3-in-1 Details for Transfer Assistance: safe mobility Ambulation/Gait Ambulation/Gait Assistance: 5: Supervision Ambulation Distance (Feet): 300 Feet Assistive device: None Ambulation/Gait Assistance Details: generally steady with one minor episode of instability Gait Pattern: Step-through pattern Stairs: No    Exercises     PT Diagnosis: Acute pain  PT Problem List: Decreased  mobility;Decreased knowledge of precautions;Pain;Decreased activity tolerance PT Treatment Interventions: Gait training;Stair training;Functional mobility training;Therapeutic activities;Patient/family education     PT Goals(Current goals can be found in the care plan section) Acute Rehab PT Goals Patient Stated Goal: Home, independent, eventually back to my normal activities PT Goal Formulation: With patient Time For Goal Achievement: 01/08/13 Potential to Achieve Goals: Good  Visit Information  Last PT Received On: 01/01/13 Assistance Needed: +1 History of Present Illness: patient complaining of neck pain with radiation to the right shoulder  And lately to the left. She did have an EMG and NCV which were negative. Myelogram showed a fusion from c4 to ct with stenosis at c3-4., s/p C3/4 PLA , lami, foraminectomy       Prior Functioning  Home Living Family/patient expects to be discharged to:: Private residence Living Arrangements: Spouse/significant other Available Help at Discharge: Family Type of Home: House Home Access: Stairs to enter Home Layout: One level Prior Function Level of Independence: Independent Communication Communication: No difficulties Dominant Hand: Right    Cognition  Cognition Arousal/Alertness: Awake/alert Behavior During Therapy: WFL for tasks assessed/performed Overall Cognitive Status: Within Functional Limits for tasks assessed    Extremity/Trunk Assessment Lower Extremity Assessment Lower Extremity Assessment: Overall WFL for tasks assessed Cervical / Trunk Assessment Cervical / Trunk Assessment: Normal   Balance Balance Balance Assessed: Yes Static Standing Balance Static Standing - Balance Support: Right upper extremity supported;Left upper extremity supported;During functional activity Static Standing - Level of Assistance: 5: Stand by assistance  End of Session PT - End of Session Equipment Utilized During Treatment: Cervical  collar Activity Tolerance: Patient tolerated treatment well  Patient left: in chair;with family/visitor present Nurse Communication: Mobility status  GP     Robie Mcniel, Eliseo Gum 01/01/2013, 3:57 PM 01/01/2013  [place]uba Bing, PT [phone]  (pager)

## 2013-01-01 NOTE — Progress Notes (Addendum)
Patient has been in and out cath. Twice for high residual bladder volume; unable to urinate; bladder scan presently shows greater then . 42F foley cath. Placed using sterile tech. Immediate return of clear yellow urine. Will keep foley cath. And advise MD of insertion.

## 2013-01-02 MED ORDER — FLUMAZENIL 0.5 MG/5ML IV SOLN
INTRAVENOUS | Status: AC
  Filled 2013-01-02: qty 5

## 2013-01-02 MED ORDER — OXYCODONE-ACETAMINOPHEN 5-325 MG PO TABS: 1.0000 | ORAL_TABLET | ORAL | Status: DC | PRN

## 2013-01-02 NOTE — Progress Notes (Signed)
Occupational Therapy Treatment Patient Details Name: Sherri [name] MRN: [id number] DOB: 16-Dec-1940 Today's Date: 01/02/2013 Time: 6578-4696 OT Time Calculation (min): 16 min  OT Assessment / Plan / Recommendation       OT comments  Pt. Agreeable to participation in skilled ot, states she wants to be up walking more.  Able to complete lb dressing set up in un supported sitting, s for grooming tasks in standing.  States she is still struggling with her pain management but otherwise feels she is doing fine.    Follow Up Recommendations  No OT follow up;Supervision/Assistance - 24 hour           Equipment Recommendations  None recommended by OT        Frequency Min 2X/week   Progress towards OT Goals Progress towards OT goals: Progressing toward goals  Plan Discharge plan remains appropriate    Precautions / Restrictions Precautions Precautions: Cervical Required Braces or Orthoses: Cervical Brace Cervical Brace: At all times   Pertinent Vitals/Pain 8/10, r.n. Present giving pain meds at beginning of session    ADL  Grooming: Performed;Wash/dry hands;Teeth care;Supervision/safety Where Assessed - Grooming: Unsupported standing Lower Body Dressing: Performed;Set up Where Assessed - Lower Body Dressing: Unsupported sitting Toilet Transfer: Simulated;Supervision/safety Transfers/Ambulation Related to ADLs: s, amb. in room ADL Comments: pt. able to cross l/r les to don socks in un supported sitting, completed grooming in un supported standing,        OT Goals(current goals can now be found in the care plan section)    Visit Information  Last OT Received On: 01/02/13    Subjective Data   "i am a very active person i want to be doing more i want to get up and walk"          Cognition  Cognition Arousal/Alertness: Awake/alert Behavior During Therapy: WFL for tasks assessed/performed Overall Cognitive Status: Within Functional Limits for tasks assessed     Mobility  Bed Mobility Bed Mobility: Supine to Sit;Sitting - Scoot to Edge of Bed Supine to Sit: 6: Modified independent (Device/Increase time) Details for Bed Mobility Assistance: pt. has a bed at home that has hospital bed features allowing hob to be elevated Transfers Transfers: Sit to Stand;Stand to Sit Sit to Stand: 5: Supervision;With upper extremity assist;From bed Stand to Sit: 5: Supervision;With upper extremity assist;With armrests;To chair/3-in-1               End of Session OT - End of Session Equipment Utilized During Treatment: Cervical collar Activity Tolerance: Patient tolerated treatment well Patient left: in chair;with call bell/phone within reach       Robet Leu, COTA/L 01/02/2013, 8:08 AM

## 2013-01-02 NOTE — Progress Notes (Signed)
Patient discharged home with leg bag for the foley cath.; instructed on care of the foley cath. Patient is to call Dr.Wrenn's office Monday morning to be seen for cath. Removal.  Patient verbalized and demonstrated care of the foley cath. And emptying the leg bag.  Discharge instructions reviewed and given for her posterior cervical spine surgery; patient ready for discharge home; her husband transported her home.

## 2013-01-02 NOTE — Progress Notes (Signed)
Physical Therapy Treatment Patient Details Name: Sherri [name] MRN: [id number] DOB: 1941-01-13 Today's Date: 01/02/2013 Time: 9147-8295 PT Time Calculation (min): 15 min  PT Assessment / Plan / Recommendation  History of Present Illness patient complaining of neck pain with radiation to the right shoulder  And lately to the left. She did have an EMG and NCV which were negative. Myelogram showed a fusion from c4 to ct with stenosis at c3-4., s/p C3/4 PLA , lami, foraminectomy   PT Comments   Pt moving well.  Appropriate from PT standpoint to d/c home when MD feels medically ready.    Follow Up Recommendations  No PT follow up;Supervision for mobility/OOB     Does the patient have the potential to tolerate intense rehabilitation     Barriers to Discharge        Equipment Recommendations  None recommended by PT    Recommendations for Other Services    Frequency Min 5X/week   Progress towards PT Goals Progress towards PT goals: Progressing toward goals  Plan Current plan remains appropriate    Precautions / Restrictions Precautions Precautions: Cervical Required Braces or Orthoses: Cervical Brace Cervical Brace: At all times   Pertinent Vitals/Pain 7/10 "incisional pain"    Mobility  Bed Mobility Bed Mobility: Not assessed Supine to Sit: 6: Modified independent (Device/Increase time) Details for Bed Mobility Assistance: pt. has a bed at home that has hospital bed features allowing hob to be elevated Transfers Transfers: Sit to Stand;Stand to Sit Sit to Stand: 5: Supervision;With upper extremity assist;With armrests;From chair/3-in-1 Stand to Sit: 5: Supervision;With upper extremity assist;With armrests;To chair/3-in-1 Details for Transfer Assistance: Incr time to stand but safe Ambulation/Gait Ambulation/Gait Assistance: 5: Supervision Ambulation Distance (Feet): 520 Feet Assistive device: None Ambulation/Gait Assistance Details: steady with no LOB noted today Gait  Pattern: Within Functional Limits Stairs: Yes Stairs Assistance: 5: Supervision Stairs Assistance Details (indicate cue type and reason): supervision for safety Stair Management Technique: One rail Right;Alternating pattern;Forwards Number of Stairs: 5 Wheelchair Mobility Wheelchair Mobility: No      PT Goals (current goals can now be found in the care plan section) Acute Rehab PT Goals PT Goal Formulation: With patient Time For Goal Achievement: 01/08/13 Potential to Achieve Goals: Good  Visit Information  Last PT Received On: 01/02/13 Assistance Needed: +1 History of Present Illness: patient complaining of neck pain with radiation to the right shoulder  And lately to the left. She did have an EMG and NCV which were negative. Myelogram showed a fusion from c4 to ct with stenosis at c3-4., s/p C3/4 PLA , lami, foraminectomy    Subjective Data      Cognition  Cognition Arousal/Alertness: Awake/alert Behavior During Therapy: WFL for tasks assessed/performed Overall Cognitive Status: Within Functional Limits for tasks assessed    Balance     End of Session PT - End of Session Equipment Utilized During Treatment: Cervical collar;Gait belt Activity Tolerance: Patient tolerated treatment well Patient left: in chair;with call bell/phone within reach Nurse Communication: Mobility status   GP     Lara Mulch 01/02/2013, 9:11 AM  Verdell Face, PTA [phone] 01/02/2013

## 2013-01-02 NOTE — Discharge Summary (Signed)
Physician Discharge Summary  Patient ID: Sherri [name] MRN: [id number] DOB/AGE: 06-30-1940 73 y.o.  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 Jeral Fruit who performed the C3 and C4 laminectomy, and C3 to 4 posterior cervical arthrodesis with posterior instrumentation and bone graft. Postoperatively she has done well, but she's had urinary retention, and required placement of a Foley catheter. We've offered to do a voiding trial but she would prefer to return home sooner, and therefore were going to discharge her with a leg bag, and she's going to follow up with her urologist Dr. Bjorn Pippin tomorrow or the next day. We've discussed wound care and activities. She is to return for followup with return a couple of weeks.  Discharge Exam: Blood pressure 158/72, pulse 87, temperature 98.7 F (37.1 C), temperature source Oral, resp. rate 19, height 5\' 2"  (1.575 m), weight 50.485 kg (111 lb 4.8 oz), SpO2 98.00%.  Disposition: Home     Medication List         Biotin 5000 MCG Tabs  Take 5,000 mcg by mouth daily.     CALCIUM + D PO  Take 1 tablet by mouth daily. 1200 mg daily     carboxymethylcellulose 0.5 % Soln  Commonly known as:  REFRESH PLUS  Place 1 drop into both eyes 2 (two) times daily.     denosumab 60 MG/ML Soln injection  Commonly known as:  PROLIA  Inject 60 mg into the skin every 6 (six) months. Administer in upper arm, thigh, or abdomen     diazepam 5 MG tablet  Commonly known as:  VALIUM  Take 5 mg by mouth at bedtime.     folic acid 1 MG tablet  Commonly known as:  FOLVITE  Take 1 mg by mouth 2 (two) times daily.     furosemide 20 MG tablet  Commonly known as:   LASIX  TAKE 1 TABLET BY MOUTH EVERY DAY     hydroxychloroquine 200 MG tablet  Commonly known as:  PLAQUENIL  Take 200 mg by mouth daily.     Methotrexate Sodium (PF) 50 MG/2ML Soln  Inject 25 mg into the muscle once a week. Every Tuesday injects into thigh     OMEGA 3 PO  Take 1,000 mg by mouth daily.     oxyCODONE-acetaminophen 5-325 MG per tablet  Commonly known as:  PERCOCET/ROXICET  Take 1-2 tablets by mouth every 4 (four) hours as needed for pain.     pneumococcal 23 valent vaccine 25 MCG/0.5ML injection  Commonly known as:  PNEUMOVAX 23  Inject 0.5 mLs into the muscle once.     predniSONE 5 MG tablet  Commonly known as:  DELTASONE  Take 5 mg by mouth daily.     ramipril 5 MG capsule  Commonly known as:  ALTACE  Take 1 capsule (5 mg total) by mouth daily before breakfast. Office visit due     traMADol 50 MG tablet  Commonly known as:  ULTRAM  Take 50-100 mg by mouth every 4 (four) hours. Maximum dose= 8 tablets per day     VITAMIN B-12 PO  Take 1 tablet by mouth daily.     VITAMIN B-6 PO  Take 1 tablet by mouth daily.  Signed: Hewitt Shorts, MD 01/02/2013, 9:22 AM

## 2013-01-02 NOTE — Progress Notes (Signed)
Clinical Child psychotherapist (CSW) received a referral for SNF placement however PT/OT saying no follow up needed. CSW signing off. Please reconsult if any CSW needs arise.  Theresia Bough, MSW, Theresia Majors [phone]

## 2013-01-03 ENCOUNTER — Encounter (HOSPITAL_COMMUNITY): Payer: Self-pay | Admitting: Neurosurgery

## 2013-02-08 ENCOUNTER — Encounter: Payer: Self-pay | Admitting: Internal Medicine

## 2013-04-26 ENCOUNTER — Telehealth: Payer: Self-pay | Admitting: Internal Medicine

## 2013-04-27 NOTE — Telephone Encounter (Signed)
Patient's colonoscopy record on her Health Maintenance Report Needs to be updated. She had a colonoscopy 06/2010.

## 2013-04-27 NOTE — Telephone Encounter (Signed)
Patients health maintenance updated to reflect colonoscopy. JG//CMA

## 2013-08-19 ENCOUNTER — Other Ambulatory Visit: Payer: Self-pay

## 2013-08-19 DIAGNOSIS — I10 Essential (primary) hypertension: Secondary | ICD-10-CM

## 2013-08-19 MED ORDER — FUROSEMIDE 20 MG PO TABS: ORAL_TABLET | ORAL | Status: DC

## 2013-08-19 NOTE — Telephone Encounter (Signed)
Refill for lasix 20 mg filled

## 2013-08-22 IMAGING — CR DG CHEST 2V
2 series · 2 of 2 positions shown · non-contrast
Comparison: 02/18/2007

CLINICAL DATA: Total hip arthroplasty.  History of smoking.
History hypertension, GERD, rib injury and pneumothorax.

CHEST - 2 VIEW

[view not recorded (1 of 2)]
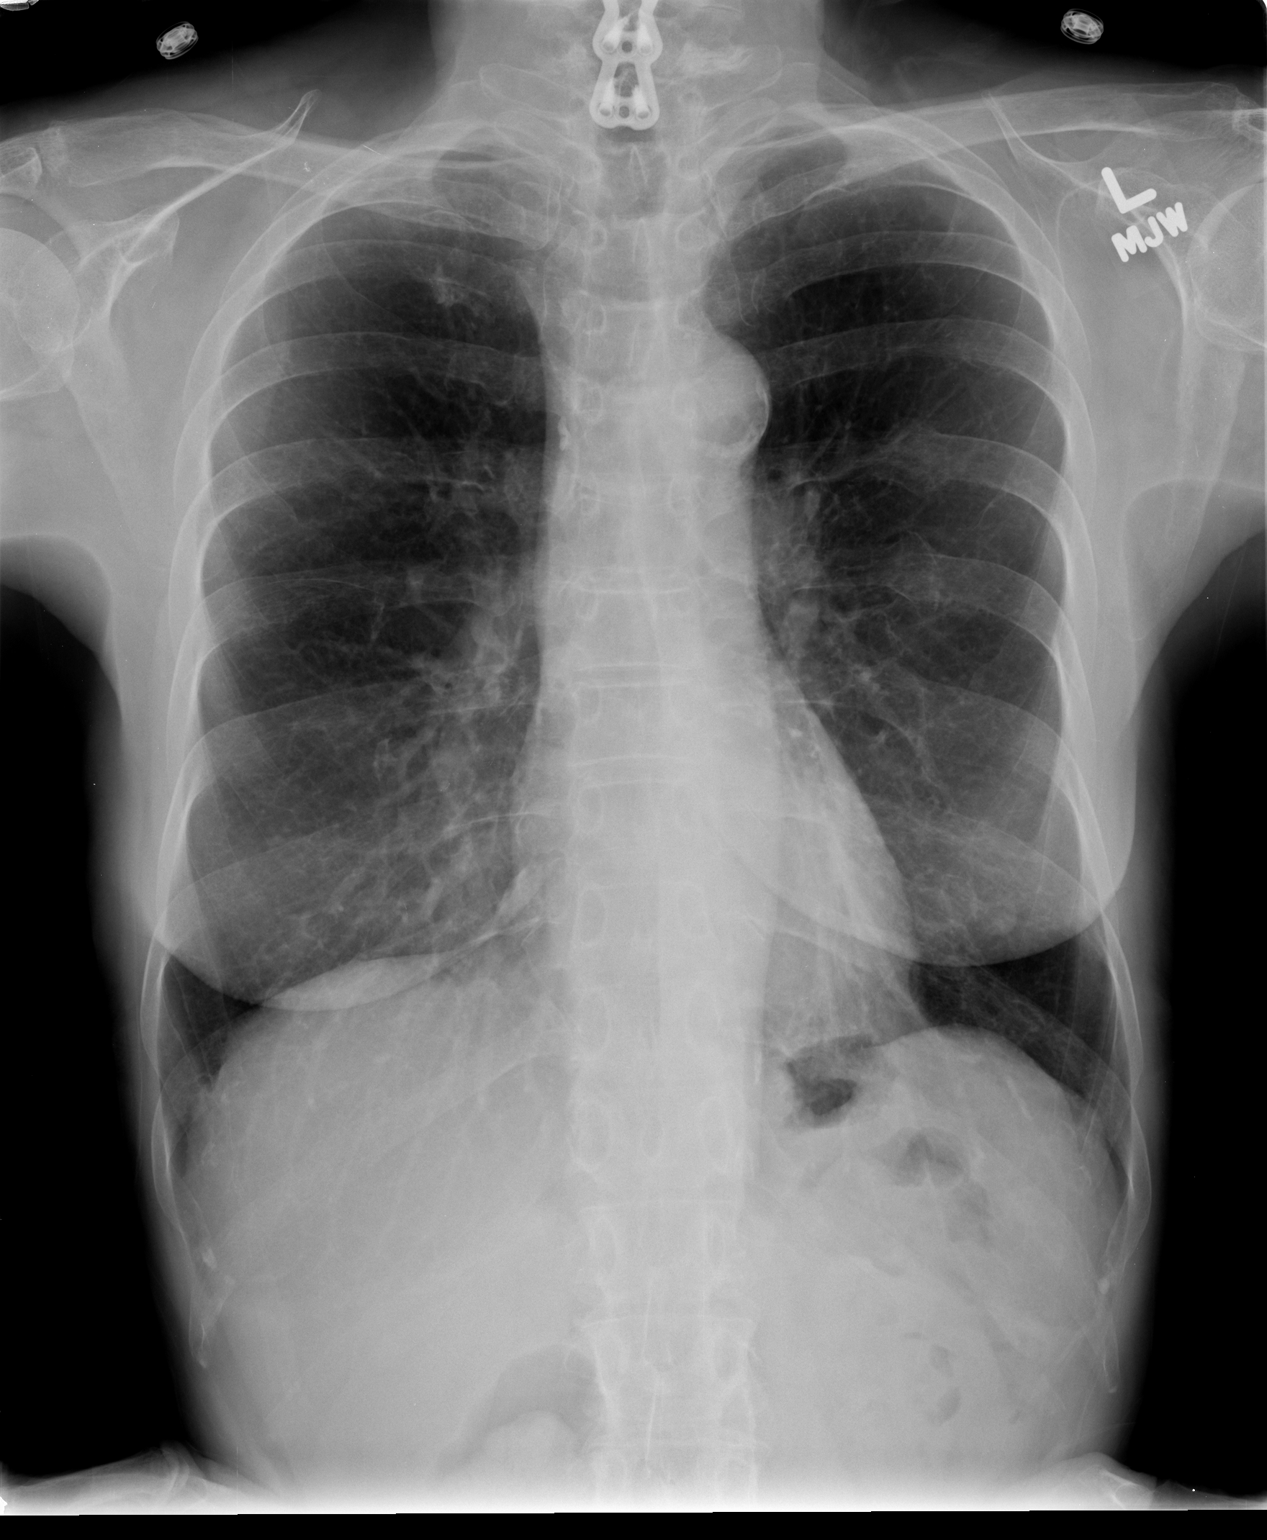

[view not recorded (2 of 2)]
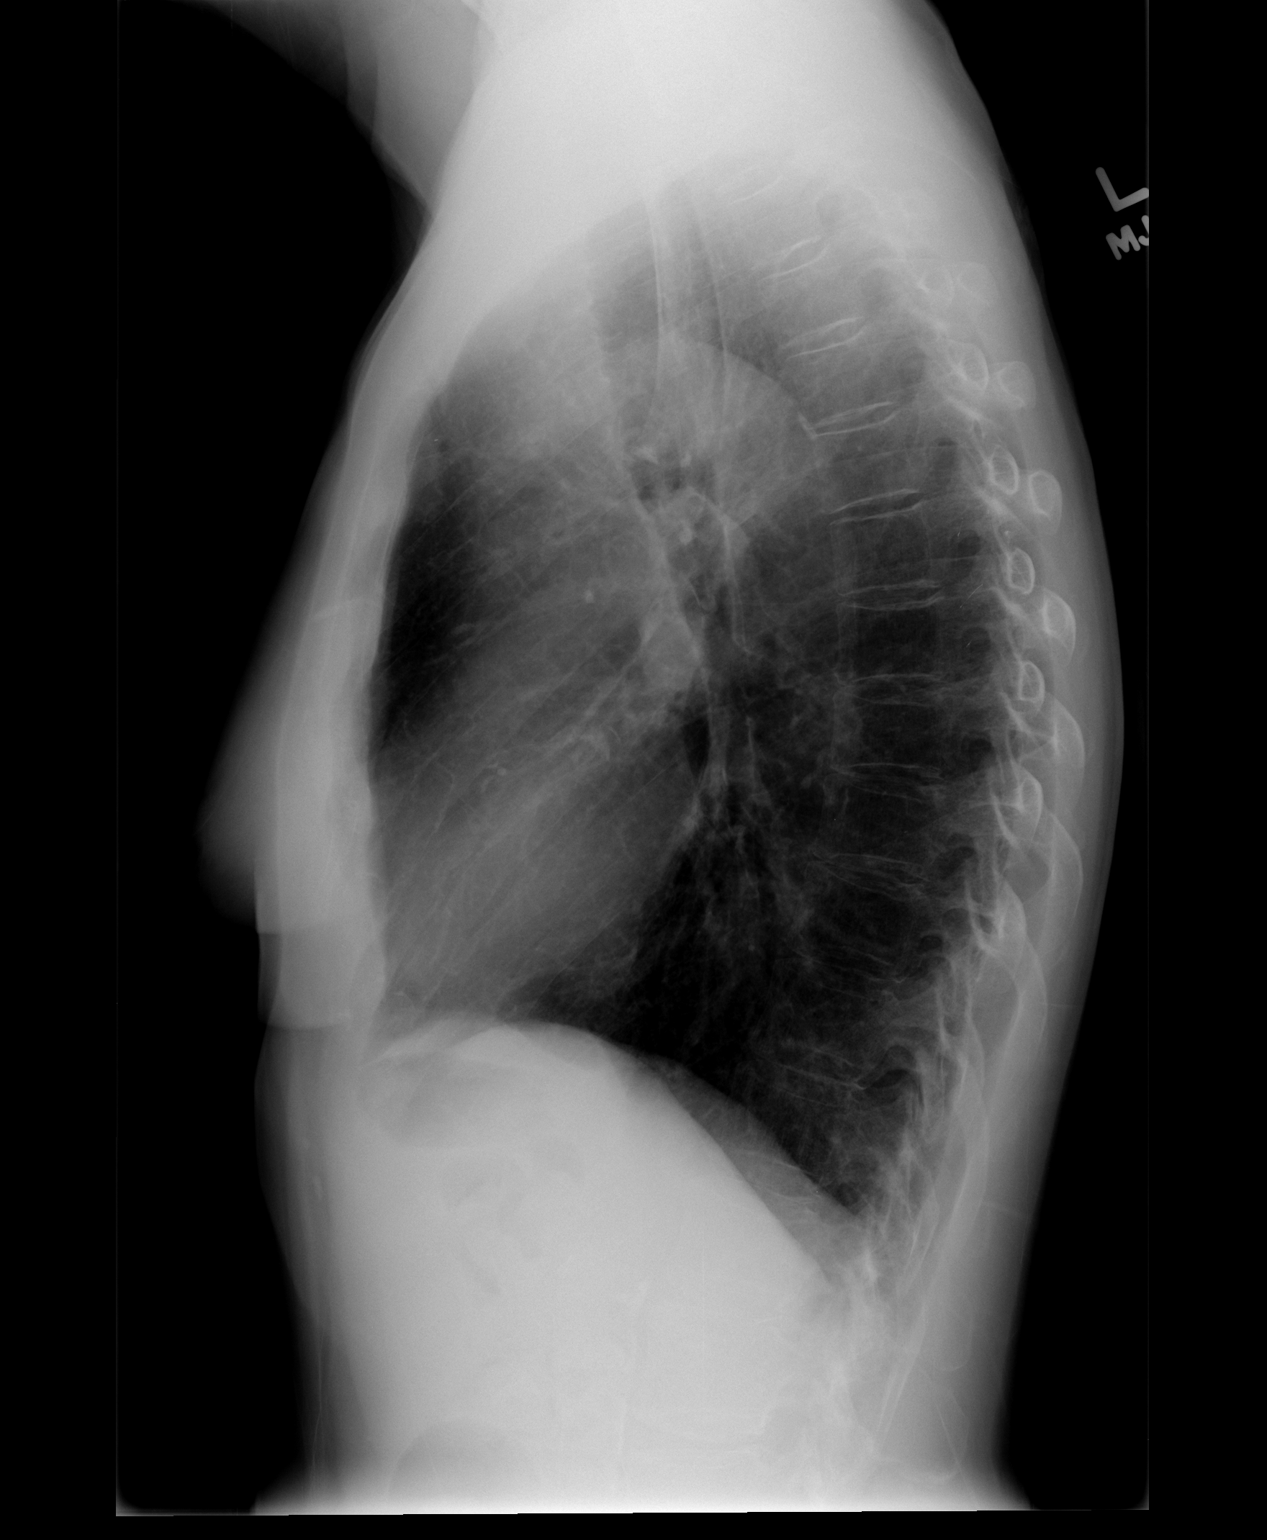

[2 of 2 positions shown; findings below may reference images not displayed]

FINDINGS: Lungs are high inflated.  Heart size is normal.  There
are no focal consolidations or pleural effusions.  Bronchitic
changes are present.  Old rib fractures are noted.  The patient has
had prior cervical fusion.
IMPRESSION: 1.  Bronchitic changes.
2.  No evidence for acute cardiopulmonary abnormality.

## 2013-10-24 ENCOUNTER — Other Ambulatory Visit (INDEPENDENT_AMBULATORY_CARE_PROVIDER_SITE_OTHER): Payer: Medicare Other

## 2013-10-24 ENCOUNTER — Encounter: Payer: Self-pay | Admitting: Internal Medicine

## 2013-10-24 ENCOUNTER — Ambulatory Visit (INDEPENDENT_AMBULATORY_CARE_PROVIDER_SITE_OTHER): Payer: Medicare Other | Admitting: Internal Medicine

## 2013-10-24 VITALS — BP 124/72 | HR 72 | Temp 97.6°F | Wt 117.6 lb

## 2013-10-24 DIAGNOSIS — R7309 Other abnormal glucose: Secondary | ICD-10-CM

## 2013-10-24 DIAGNOSIS — E785 Hyperlipidemia, unspecified: Secondary | ICD-10-CM

## 2013-10-24 DIAGNOSIS — E041 Nontoxic single thyroid nodule: Secondary | ICD-10-CM

## 2013-10-24 DIAGNOSIS — K219 Gastro-esophageal reflux disease without esophagitis: Secondary | ICD-10-CM

## 2013-10-24 DIAGNOSIS — I10 Essential (primary) hypertension: Secondary | ICD-10-CM

## 2013-10-24 LAB — HEMOGLOBIN A1C: Hgb A1c MFr Bld: 5.6 % (ref 4.6–6.5)

## 2013-10-24 LAB — TSH: TSH: 2.85 u[IU]/mL (ref 0.35–4.50)

## 2013-10-24 MED ORDER — RANITIDINE HCL 150 MG PO TABS: 150.0000 mg | ORAL_TABLET | Freq: Two times a day (BID) | ORAL | Status: DC

## 2013-10-24 NOTE — Assessment & Plan Note (Signed)
TSH 

## 2013-10-24 NOTE — Assessment & Plan Note (Signed)
A1c

## 2013-10-24 NOTE — Progress Notes (Signed)
Subjective:    Patient ID: Sherri [name], female    DOB: June 08, 1940, 73 y.o.   MRN: [id number]  HPI She is here to assess status of active health conditions:  Diet/ nutrition:no program Exercise program:no due to RA & MS  HYPERTENSION: Disease Monitoring: Blood pressure range/ average : average 110/70, max 120/90 Medication Compliance: yes  FASTING HYPERGLYCEMIA , PMH of in context of long term oral steroids: 10/20/13 non fasting glucose 93 FBS range/average:no monitor Highest 2 hr post meal glucose:no monitor Medication compliance:no meds Hypoglycemia:no Ophthamology care: UTD Podiatry care:no exam  HYPERLIPIDEMIA: Disease Monitoring: 5/28 LDL 108,HDL 83 Medication Compliance:no statin      Review of Systems  Chest pain, palpitations: no      Dyspnea:no Edema:no except post fx R foot Claudication: no Lightheadedness:post squatting or flexing @ waist Weight gain/loss:up 4# Polyuria/phagia/dipsia: only frequency with oral steroids     Blurred vision /diplopia/lossof vision: no Limb numbness/tingling/burning:no Non healing skin lesions:no Abd pain, bowel changes: GERD treated with prn TUMS w/o benefit  Myalgias/arthralgias : due to RA Memory loss:no      Objective:   Physical Exam Gen.: Thin but adequately nourished in appearance. Alert, appropriate and cooperative throughout exam. Appears younger than stated age  Head: Normocephalic without obvious abnormalities Eyes: No corneal or conjunctival inflammation noted. Pupils equal round reactive to light and accommodation. Extraocular motion intact.  Ears: External  ear exam reveals no significant lesions or deformities.  Nose: External nasal exam reveals no deformity or inflammation. Nasal mucosa are pink and moist. No lesions or exudates noted.   Mouth: Oral mucosa and oropharynx reveal no lesions or exudates. Teeth in good repair. Neck: No deformities, masses, or tenderness noted. Range of motion decreased  Thyroid small. L cervical rib Lungs: Normal respiratory effort; chest expands symmetrically. Lungs are clear to auscultation without rales, wheezes, or increased work of breathing. Heart: Normal rate and rhythm. Normal S1 and S2. No gallop, click, or rub. S4 w/o murmur. Abdomen: Bowel sounds normal; abdomen soft and nontender. No masses, organomegaly or hernias noted.                                Musculoskeletal/extremities: No deformity or scoliosis noted of  the thoracic or lumbar spine. (spine fused)  No clubbing, cyanosis, or edema noted. Range of motion normal .Tone & strength normal. Hand joints reveal isolated DIP changes. PIP changes are not dramatic. Fingernail / toenail health good. Able to lie down & sit up w/o help. Negative SLR bilaterally Vascular: Carotid, radial artery, dorsalis pedis and  posterior tibial pulses are  equal. Slightly decreased dorsalis pedis pulses. No bruits present. Neurologic: Alert and oriented x3. Deep tendon reflexes symmetrical and normal.  Gait broad & stiff    Skin: Intact without suspicious lesions or rashes. Lymph: No cervical, axillary lymphadenopathy present. Psych: Mood and affect are normal. Normally interactive                                                                                        Assessment & Plan:  See Current Assessment &  Plan in Problem List under specific Diagnosis

## 2013-10-24 NOTE — Patient Instructions (Addendum)
Your next office appointment will be determined based upon review of your pending labs . Those instructions will be transmitted to you through My Chart    Minimal Blood Pressure Goal= AVERAGE < 140/90;  Ideal is an AVERAGE < 135/85. This AVERAGE should be calculated from @ least 5-7 BP readings taken @ different times of day on different days of week. You should not respond to isolated BP readings , but rather the AVERAGE for that week .Please bring your  blood pressure cuff to office visits to verify that it is reliable.It  can also be checked against the blood pressure device at the pharmacy. Finger or wrist cuffs are not dependable; an arm cuff is.  Reflux of gastric acid may be asymptomatic as this may occur mainly during sleep.The triggers for reflux  include stress; the "aspirin family" ; alcohol; peppermint; and caffeine (coffee, tea, cola, and chocolate). The aspirin family would include aspirin and the nonsteroidal agents such as ibuprofen &  Naproxen. Tylenol would not cause reflux. If having symptoms ; food & drink should be avoided for @ least 2 hours before going to bed.  

## 2013-10-24 NOTE — Progress Notes (Signed)
Pre visit review using our clinic review tool, if applicable. No additional management support is needed unless otherwise documented below in the visit note. 

## 2013-10-24 NOTE — Assessment & Plan Note (Addendum)
Lipids @ goal No statin indicated TSH

## 2013-10-24 NOTE — Assessment & Plan Note (Signed)
There is  minimal reduction in Alkaline Phosphatase.Dietary sources of  Alk Phos include whole grains & nuts. Alk Phos is important for optimal liver & bone health.

## 2013-10-24 NOTE — Assessment & Plan Note (Signed)
Blood pressure goals reviewed. BMET essen WNL

## 2013-10-25 ENCOUNTER — Encounter: Payer: Self-pay | Admitting: Internal Medicine

## 2013-11-17 ENCOUNTER — Other Ambulatory Visit: Payer: Self-pay

## 2013-11-17 ENCOUNTER — Encounter: Payer: Medicare Other | Admitting: Internal Medicine

## 2013-11-17 MED ORDER — RANITIDINE HCL 150 MG PO TABS: 150.0000 mg | ORAL_TABLET | Freq: Two times a day (BID) | ORAL | Status: DC

## 2013-11-21 ENCOUNTER — Other Ambulatory Visit: Payer: Self-pay

## 2013-11-21 DIAGNOSIS — I10 Essential (primary) hypertension: Secondary | ICD-10-CM

## 2013-11-21 MED ORDER — RAMIPRIL 5 MG PO CAPS: 5.0000 mg | ORAL_CAPSULE | Freq: Every day | ORAL | Status: DC

## 2013-12-14 ENCOUNTER — Encounter: Payer: Self-pay | Admitting: Internal Medicine

## 2013-12-14 ENCOUNTER — Ambulatory Visit (INDEPENDENT_AMBULATORY_CARE_PROVIDER_SITE_OTHER): Payer: Medicare Other | Admitting: Internal Medicine

## 2013-12-14 VITALS — BP 102/62 | HR 93 | Temp 98.3°F | Wt 119.0 lb

## 2013-12-14 DIAGNOSIS — G35 Multiple sclerosis: Secondary | ICD-10-CM

## 2013-12-14 DIAGNOSIS — M069 Rheumatoid arthritis, unspecified: Secondary | ICD-10-CM

## 2013-12-14 DIAGNOSIS — K2100 Gastro-esophageal reflux disease with esophagitis, without bleeding: Secondary | ICD-10-CM

## 2013-12-14 MED ORDER — SUCRALFATE 1 G PO TABS: ORAL_TABLET | ORAL | Status: DC

## 2013-12-14 NOTE — Patient Instructions (Addendum)
Reflux of gastric acid may be asymptomatic as this may occur mainly during sleep.The triggers for reflux  include stress; the "aspirin family" ; alcohol; peppermint; and caffeine (coffee, tea, cola, and chocolate). The aspirin family would include aspirin and the nonsteroidal agents such as ibuprofen &  Naproxen. Tylenol would not cause reflux. If having symptoms ; food & drink should be avoided for @ least 2 hours before going to bed. Dissolve Carafate tablet in 5 cc (1 teaspoon) of water. Take before meals and at bedtime. GI referral if no better over next 2 weeks.

## 2013-12-14 NOTE — Progress Notes (Signed)
Pre visit review using our clinic review tool, if applicable. No additional management support is needed unless otherwise documented below in the visit note. 

## 2013-12-14 NOTE — Progress Notes (Signed)
   Subjective:    Patient ID: Sherri [name], female    DOB: [birth year], 73 y.o.   MRN: [id number]  HPI  She's had a sore throat and hoarseness since 12/09/13. She notes clearing of clear material from her throat and describes burning in the throat. She states that this is similar to symptoms she had prior to having her thyroid condition corrected  She describes meds & food as getting hung in her throat intermittently over the last month.  Review of Systems   She has no signs or symptoms of rhinosinusitis.  Frontal headache, facial pain , nasal purulence, dental pain, , otic pain or otic discharge denied. No fever , chills or sweats. Centor criteria for strep pharyngitis negative. No cough. Unexplained weight loss, abdominal pain,  melena, or rectal bleeding.       Objective:   Physical Exam General appearance:Thin but in good health ;well nourished; no acute distress or increased work of breathing is present.  No  lymphadenopathy about the head, neck, or axilla noted.   Eyes: No conjunctival inflammation or lid edema is present. There is no scleral icterus.  Ears:  External ear exam shows no significant lesions or deformities.  Otoscopic examination reveals clear canals, tympanic membranes are intact bilaterally without bulging, retraction, inflammation or discharge.  Nose:  External nasal examination shows no deformity or inflammation. Nasal mucosa are pink and moist without lesions or exudates. No septal dislocation or deviation.No obstruction to airflow.   Oral exam: Dental hygiene is good; lips and gums are healthy appearing.There is no oropharyngeal erythema or exudate noted.   Neck:  No deformities, thyromegaly, masses, or tenderness noted.    Heart:  Normal rate and regular rhythm. S1 and S2 normal without gallop, murmur, click, rub or other extra sounds.   Lungs:Chest clear to auscultation; no wheezes, rhonchi,rales ,or rubs present.No increased work of breathing.     Extremities:  No cyanosis, edema, or clubbing  noted    Skin: Warm & dry .Decreased facial expression in the context of prior cosmetic surgery.       Assessment & Plan:  #1 laryngoesophageal reflux with esophagitis #2 intermittent dysphagia #3 MTX therapy precludes PPI therapy Plan: trial of Carafate slurry qid X 2 weeks.GI referral if no better.

## 2013-12-19 ENCOUNTER — Other Ambulatory Visit: Payer: Self-pay | Admitting: Internal Medicine

## 2013-12-19 ENCOUNTER — Telehealth: Payer: Self-pay | Admitting: *Deleted

## 2013-12-19 DIAGNOSIS — R1310 Dysphagia, unspecified: Secondary | ICD-10-CM

## 2013-12-19 NOTE — Telephone Encounter (Signed)
Notified pt with md response.../lmb 

## 2013-12-19 NOTE — Telephone Encounter (Signed)
It is not Xray but GI referral Referral made

## 2013-12-19 NOTE — Telephone Encounter (Signed)
Pt left msg on triage stating md told her if her throat is not better to call on Mon and he would order an xray of her throat. Pt is wanting xray to be sent up throat is not better...Sherri [name]

## 2013-12-20 ENCOUNTER — Encounter: Payer: Self-pay | Admitting: Gastroenterology

## 2013-12-23 ENCOUNTER — Encounter: Payer: Self-pay | Admitting: *Deleted

## 2013-12-29 ENCOUNTER — Ambulatory Visit (INDEPENDENT_AMBULATORY_CARE_PROVIDER_SITE_OTHER): Payer: Medicare Other | Admitting: Gastroenterology

## 2013-12-29 ENCOUNTER — Encounter: Payer: Self-pay | Admitting: Gastroenterology

## 2013-12-29 VITALS — BP 120/74 | HR 76 | Ht 62.0 in | Wt 117.0 lb

## 2013-12-29 DIAGNOSIS — R1319 Other dysphagia: Secondary | ICD-10-CM | POA: Insufficient documentation

## 2013-12-29 DIAGNOSIS — R490 Dysphonia: Secondary | ICD-10-CM | POA: Insufficient documentation

## 2013-12-29 MED ORDER — PANTOPRAZOLE SODIUM 40 MG PO TBEC: 40.0000 mg | DELAYED_RELEASE_TABLET | Freq: Every day | ORAL | Status: DC

## 2013-12-29 NOTE — Patient Instructions (Signed)
Please stop Zantac and Carafate  Please start Pantoprazole 40 mg, one capsule by mouth once daily _________________________________________________________________________________________________________________________________________________________________________________________________  You have been scheduled for a Barium Esophogram at [facility] (1st floor of the hospital) on 01-03-2014 at 10 am. Please arrive 15 minutes prior to your appointment for registration. Make certain not to have anything to eat or drink 6 hours prior to your test. If you need to reschedule for any reason, please contact radiology at [phone] to do so. __________________________________________________________________ A barium swallow is an examination that concentrates on views of the esophagus. This tends to be a double contrast exam (barium and two liquids which, when combined, create a gas to distend the wall of the oesophagus) or single contrast (non-ionic iodine based). The study is usually tailored to your symptoms so a good history is essential. Attention is paid during the study to the form, structure and configuration of the esophagus, looking for functional disorders (such as aspiration, dysphagia, achalasia, motility and reflux) EXAMINATION You may be asked to change into a gown, depending on the type of swallow being performed. A radiologist and radiographer will perform the procedure. The radiologist will advise you of the type of contrast selected for your procedure and direct you during the exam. You will be asked to stand, sit or lie in several different positions and to hold a small amount of fluid in your mouth before being asked to swallow while the imaging is performed .In some instances you may be asked to swallow barium coated marshmallows to assess the motility of a solid food bolus. The exam can be recorded as a digital or video fluoroscopy procedure. POST PROCEDURE It will take 1-2  days for the barium to pass through your system. To facilitate this, it is important, unless otherwise directed, to increase your fluids for the next 24-48hrs and to resume your normal diet.  This test typically takes about 30 minutes to perform. __________________________________________________________________________________

## 2013-12-29 NOTE — Progress Notes (Signed)
12/29/2013 Sherri [name] [id number] 01-Nov-1940   HISTORY OF PRESENT ILLNESS:  This is a 73 year old female who is previously known to Dr. Deatra Ina.  She had normal EGD and colonoscopy in 06/2010.  She presents to our office today with complaints of hoarseness and dysphagia.  Mostly has trouble swallowing pills.  Has been present since at least the beginning of July.  Pills seem to get caught on one side of her throat (points to upper esophagus).  Her voice is hoarse and she clears her throat a lot.  No sensation of heartburn/reflux, however.  Was placed on zantac 150 mg BID and carafate pill four times daily prn; no real improvement.  Had been on several different PPI's in the past for reflux, but she discontinued them after a while because she was feeling well and was worried about taking them long-term.  Of note, she has RA and MS and has undergone several back/neck surgeries with some hardware placed.   Past Medical History  Diagnosis Date  . Nonspecific abnormal electrocardiogram (ECG) (EKG)     EKG is normal but reveals early repolarization ST-T changes.  . Rheumatoid arthritis(714.0)     Dr Estanislado Pandy   . OA (osteoarthritis)     Dr Lynann Bologna  . Neuromuscular disorder     Dr. Park Liter- in [place]- [facility] follows [facility]  . Hypertension     stress test many yrs. ago  . GERD (gastroesophageal reflux disease)     +reflux, on occas, no Rx  . Cancer     basal cell ca, in situ- uterine   . PONV (postoperative nausea and vomiting)   . Multiple sclerosis   . 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 Lumbar fusion      Dr Joya Salm  . Appendectomy      with TAH  . Cervical fusion      Dr Joya Salm  . Carpal tunnel release    . Chest tube insertion      for traumatic Pneumothorax  . Colonoscopy w/ polypectomy  1997    negative since; Dr Deatra Ina  . Thyroid surgery      R lobe removed- 1972  . Abdominal hysterectomy      1985  . Nasal sinus surgery    .  Total hip arthroplasty  12/22/2011    Procedure: TOTAL HIP ARTHROPLASTY;  Surgeon: Kerin Salen, MD;  Location: [facility];  Service: Orthopedics;  Laterality: Left;  . Cervical fusion  12/31/2012    Dr Joya Salm  . Posterior cervical fusion/foraminotomy N/A 12/31/2012    Procedure: POSTERIOR LATERAL CERVICAL FUSION/FORAMINOTOMY LEVEL 1 CERVICAL THREE-FOUR WITH LATERAL MASS SCREWS;  Surgeon: Floyce Stakes, MD;  Location: [facility];  Service: Neurosurgery;  Laterality: N/A;  . Colonoscopy  07/16/2010    normal   . Esophagogastroduodenoscopy  07/16/2010    normal    reports that she quit smoking about 32 years ago. She does not have any smokeless tobacco history on file. She reports that she drinks about 4.2 ounces of alcohol per week. She reports that she does not use illicit drugs. family history includes Cancer in her father, maternal aunt, maternal grandmother, mother, and sister; Colon cancer in an other family member; Diabetes in her mother; Heart disease in her father and paternal aunt. There is no history of Hypertension or Stroke. Allergies  Allergen Reactions  . Meperidine Hcl Shortness Of Breath  . Penicillins Hives  .  Azathioprine Other (See Comments)    Weakness  . Darvon [Propoxyphene Hcl]     Lowered respirations greatly  . Ezetimibe-Simvastatin Nausea And Vomiting      Outpatient Encounter Prescriptions as of 12/29/2013  Medication Sig  . Biotin 5000 MCG TABS Take 5,000 mcg by mouth daily.   . Calcium Carbonate-Vitamin D (CALCIUM + D PO) Take 1 tablet by mouth daily. 1200 mg daily  . carboxymethylcellulose (REFRESH PLUS) 0.5 % SOLN Place 1 drop into both eyes 2 (two) times daily.  . Cyanocobalamin (VITAMIN B-12 PO) Take 1 tablet by mouth daily.  [name] denosumab (PROLIA) 60 MG/ML SOLN injection Inject 60 mg into the skin every 6 (six) months. Administer in upper arm, thigh, or abdomen  . diazepam (VALIUM) 5 MG tablet Take 5 mg by mouth at bedtime.   . folic acid (FOLVITE) 1 MG  tablet Take 1 mg by mouth 2 (two) times daily.  . furosemide (LASIX) 20 MG tablet TAKE 1 TABLET BY MOUTH EVERY DAY, pt needs to schedule office visit  . Methotrexate Sodium, PF, 50 MG/2ML SOLN Inject 25 mg into the muscle once a week. Every Tuesday injects into thigh  . Omega-3 Fatty Acids (OMEGA 3 PO) Take 1,000 mg by mouth daily.   [name] oxyCODONE-acetaminophen (PERCOCET/ROXICET) 5-325 MG per tablet Take 1-2 tablets by mouth every 4 (four) hours as needed for pain.  . pneumococcal 23 valent vaccine (PNEUMOVAX 23) 25 MCG/0.5ML injection Inject 0.5 mLs into the muscle once.  . predniSONE (DELTASONE) 5 MG tablet Take 5 mg by mouth daily.   . Pyridoxine HCl (VITAMIN B-6 PO) Take 1 tablet by mouth daily.  . ramipril (ALTACE) 5 MG capsule Take 1 capsule (5 mg total) by mouth daily before breakfast. Office visit due  . TIZANIDINE HCL PO Take by mouth.  . Tofacitinib Citrate (XELJANZ) 5 MG TABS Take 1 tablet by mouth 2 (two) times daily.  . traMADol (ULTRAM) 50 MG tablet Take 50-100 mg by mouth every 4 (four) hours. Maximum dose= 8 tablets per day  . [DISCONTINUED] sucralfate (CARAFATE) 1 G tablet Tid ac & qhs  . pantoprazole (PROTONIX) 40 MG tablet Take 1 tablet (40 mg total) by mouth daily.     REVIEW OF SYSTEMS  : All other systems reviewed and negative except where noted in the History of Present Illness.   PHYSICAL EXAM: BP 120/74  Pulse 76  Ht 5\' 2"  (1.575 m)  Wt 117 lb (53.071 kg)  BMI 21.39 kg/m2 General: Well developed white female in no acute distress Head: Normocephalic and atraumatic Eyes:  Sclerae anicteric, conjunctiva pink. Ears: Normal auditory acuity  Lungs: Clear throughout to auscultation Heart: Regular rate and rhythm Abdomen: Soft, non-distended.  Normal bowel sounds.  Non-tender. Musculoskeletal: Symmetrical with no gross deformities  Skin: No lesions on visible extremities Extremities: No edema  Neurological: Alert oriented x 4, grossly non-focal Psychological:   Alert and cooperative. Normal mood and affect  ASSESSMENT AND PLAN: -Dysphagia, mostly to pills and hoarseness:  Getting stuck in throat.  Will check esophagram to rule out dysmotility vs Zenker's vs extrinsic compression, etc.  Will discontinue zantac and carafate and place her on pantoprazole 40 mg daily for acid suppression for now to see if this helps.

## 2013-12-30 NOTE — Progress Notes (Signed)
Reviewed and agree with management. Lesha Jager D. Kyo Cocuzza, M.D., FACG  

## 2014-01-03 ENCOUNTER — Ambulatory Visit (HOSPITAL_COMMUNITY)
Admission: RE | Admit: 2014-01-03 | Discharge: 2014-01-03 | Disposition: A | Discharge status: Home / Self Care | Payer: Medicare Other | Source: Ambulatory Visit | Attending: Gastroenterology | Admitting: Gastroenterology

## 2014-01-03 ENCOUNTER — Other Ambulatory Visit: Payer: Self-pay | Admitting: *Deleted

## 2014-01-03 DIAGNOSIS — R1310 Dysphagia, unspecified: Secondary | ICD-10-CM | POA: Insufficient documentation

## 2014-01-03 DIAGNOSIS — R1319 Other dysphagia: Secondary | ICD-10-CM

## 2014-01-03 DIAGNOSIS — Z981 Arthrodesis status: Secondary | ICD-10-CM | POA: Insufficient documentation

## 2014-01-04 ENCOUNTER — Encounter: Payer: Self-pay | Admitting: Gastroenterology

## 2014-01-18 DIAGNOSIS — Z79899 Other long term (current) drug therapy: Secondary | ICD-10-CM | POA: Insufficient documentation

## 2014-01-18 DIAGNOSIS — M810 Age-related osteoporosis without current pathological fracture: Secondary | ICD-10-CM | POA: Insufficient documentation

## 2014-02-19 ENCOUNTER — Other Ambulatory Visit: Payer: Self-pay | Admitting: Internal Medicine

## 2014-02-23 ENCOUNTER — Telehealth: Payer: Self-pay | Admitting: Gastroenterology

## 2014-02-23 ENCOUNTER — Ambulatory Visit: Payer: Medicare Other | Admitting: Gastroenterology

## 2014-02-23 NOTE — Telephone Encounter (Signed)
No charge. 

## 2014-03-10 ENCOUNTER — Other Ambulatory Visit: Payer: Self-pay

## 2014-03-10 ENCOUNTER — Encounter: Payer: Self-pay | Admitting: Internal Medicine

## 2014-03-10 ENCOUNTER — Ambulatory Visit (INDEPENDENT_AMBULATORY_CARE_PROVIDER_SITE_OTHER): Payer: Medicare Other | Admitting: Internal Medicine

## 2014-03-10 VITALS — BP 130/78 | HR 73 | Temp 98.0°F | Resp 14 | Wt 117.0 lb

## 2014-03-10 DIAGNOSIS — L03115 Cellulitis of right lower limb: Secondary | ICD-10-CM

## 2014-03-10 MED ORDER — MUPIROCIN 2 % EX OINT: TOPICAL_OINTMENT | CUTANEOUS | Status: DC

## 2014-03-10 NOTE — Patient Instructions (Signed)
Dip gauze in  sterile saline and applied to the wound twice a day. Cover the wound with Telfa , non stick dressing  without any antibiotic ointment. The saline can be purchased at the drugstore or you can make your own .Boil cup of salt in a gallon of water. Store mixture  in a clean container.Report Warning  signs as discussed (red streaks, pus, fever, increasing pain).Fill the  prescription for antibiotic ointment it there is not dramatic improvement in the next 48-72 hours.

## 2014-03-10 NOTE — Progress Notes (Signed)
Pre visit review using our clinic review tool, if applicable. No additional management support is needed unless otherwise documented below in the visit note. 

## 2014-03-10 NOTE — Progress Notes (Signed)
   Subjective:    Patient ID: Sherri [name], female    DOB: [birth year], 73 y.o.   MRN: [id number]  HPI   Since 02/23/14 she's had a nonhealing lesion over the right shin. She states it started out as a white blister that has progressed to an area of tender erythema 12 x 12 mm.  She has been applying Neosporin without benefit since 02/27/14 when she saw her Rheumatologist. She is on the immunosuppressant agent [name]. CBC and differential done 10/5 was normal with no increased white count or left shift.  No PMH of MRSA    Review of Systems   She denies fever, chills, sweats  There's been no purulent drainage from the shin lesion.     Objective:   Physical Exam  Pertinent positive findings include a 12 X 12 mm area of erythema over the R shin which is tender to palpation . Centrally there is a 5 x 5 mm eschar The pedal pulses are decreased, especially dorsalis pedis pulses.   There are no ischemic skin changes. Nail health is good She has an S4 with a regular rhythm Chest is clear with no increased work of breathing She has no lymphadenopathy about the neck or axilla.        Assessment & Plan:  #1 shin cellulitis; possible component of contact reaction to Neosporin.  #2 peripheral vascular disease with decreased pedal pulses  Plan: Wet-to-dry dressings. Bactroban if there is progression of the erythema and tenderness. Warning signs discussed.

## 2014-03-21 ENCOUNTER — Encounter: Payer: Self-pay | Admitting: Internal Medicine

## 2014-04-14 ENCOUNTER — Encounter: Payer: Self-pay | Admitting: Internal Medicine

## 2014-04-14 ENCOUNTER — Ambulatory Visit (INDEPENDENT_AMBULATORY_CARE_PROVIDER_SITE_OTHER): Payer: Medicare Other | Admitting: Internal Medicine

## 2014-04-14 VITALS — BP 110/70 | HR 69 | Temp 97.6°F | Ht 62.0 in | Wt 116.5 lb

## 2014-04-14 DIAGNOSIS — I10 Essential (primary) hypertension: Secondary | ICD-10-CM

## 2014-04-14 DIAGNOSIS — L929 Granulomatous disorder of the skin and subcutaneous tissue, unspecified: Secondary | ICD-10-CM

## 2014-04-14 NOTE — Progress Notes (Signed)
Pre visit review using our clinic review tool, if applicable. No additional management support is needed unless otherwise documented below in the visit note. 

## 2014-04-14 NOTE — Patient Instructions (Signed)
Perform isometric exercise of calves  ( while seated go up on toes to count of 5 & then onto heels for 5 count). Repeat  4- 5 times prior to standing if you've been seated or supine for any significant period of time as BP drops with such positions.  

## 2014-04-14 NOTE — Progress Notes (Signed)
   Subjective:    Patient ID: Sherri [name], female    DOB: April 14, 1941, 74 y.o.   MRN: [id number]  HPI She came in for the Medicare Wellness exam; actually this was completed this Summer by the [facility] nurse @ a home visit  She's been compliant with her diuretic and antihypertensive. Blood pressures been well controlled and she has no cardiovascular symptoms except for some postural hypotension symptoms intermittently  The lesion over the right shin for which she was prescribed topical antibiotics did resolve but there is  a residual lesion of concern to her.   Review of Systems   Chest pain, palpitations, tachycardia, exertional dyspnea, paroxysmal nocturnal dyspnea, claudication or edema are absent. The lesion is not associated with tenderness, redness, or purulence       Objective:   Physical Exam   Pertinent or positive findings:  Thin but well nourished She has mixed deformities of the PIP and DIP joints of the hands Abdomen is scaphoid and well muscled. There is a 7 x 8 mm granuloma over the right shin. There is no sign of associated cellulitis or purulence  Eyes: No conjunctival inflammation or scleral icterus is present. Oral exam: Dental hygiene is good. Lips and gums are healthy appearing.There is no oropharyngeal erythema or exudate noted.  Heart:  Normal rate and regular rhythm. S1 and S2 normal without gallop, murmur, click, rub or other extra sounds   Lungs:Chest clear to auscultation; no wheezes, rhonchi,rales ,or rubs present.No increased work of breathing.  Abdomen: bowel sounds normal, soft and non-tender without masses, organomegaly or hernias noted.  No guarding or rebound.  Vascular : all pulses equal ; no bruits present. Skin:Warm & dry.  Intact without suspicious lesions or rashes ; no jaundice or tenting Lymphatic: No lymphadenopathy is noted about the head, neck, axilla          Assessment & Plan:  #1 hypertension well controlled  #2 intermittent  postural hypotension  #3  posttraumatic granuloma right shin  Plan: She will be referred for surgical removal of granuloma if she wishes. Told her that clinically there is no suggestion of any abnormal pathology to this represents a posttraumatic granuloma.

## 2014-05-02 ENCOUNTER — Encounter: Payer: Self-pay | Admitting: Internal Medicine

## 2014-06-08 DIAGNOSIS — Z79899 Other long term (current) drug therapy: Secondary | ICD-10-CM | POA: Diagnosis not present

## 2014-06-08 DIAGNOSIS — M2550 Pain in unspecified joint: Secondary | ICD-10-CM | POA: Diagnosis not present

## 2014-06-13 ENCOUNTER — Ambulatory Visit (INDEPENDENT_AMBULATORY_CARE_PROVIDER_SITE_OTHER): Payer: Medicare Other | Admitting: Neurology

## 2014-06-13 ENCOUNTER — Encounter: Payer: Self-pay | Admitting: Neurology

## 2014-06-13 VITALS — BP 146/80 | HR 78 | Resp 14 | Wt 116.0 lb

## 2014-06-13 DIAGNOSIS — R269 Unspecified abnormalities of gait and mobility: Secondary | ICD-10-CM | POA: Insufficient documentation

## 2014-06-13 DIAGNOSIS — R5382 Chronic fatigue, unspecified: Secondary | ICD-10-CM | POA: Diagnosis not present

## 2014-06-13 DIAGNOSIS — M4312 Spondylolisthesis, cervical region: Secondary | ICD-10-CM | POA: Diagnosis not present

## 2014-06-13 DIAGNOSIS — M069 Rheumatoid arthritis, unspecified: Secondary | ICD-10-CM

## 2014-06-13 DIAGNOSIS — F418 Other specified anxiety disorders: Secondary | ICD-10-CM | POA: Insufficient documentation

## 2014-06-13 DIAGNOSIS — R351 Nocturia: Secondary | ICD-10-CM | POA: Diagnosis not present

## 2014-06-13 DIAGNOSIS — G35 Multiple sclerosis: Secondary | ICD-10-CM | POA: Diagnosis not present

## 2014-06-13 MED ORDER — BUSPIRONE HCL 15 MG PO TABS: 15.0000 mg | ORAL_TABLET | Freq: Two times a day (BID) | ORAL | Status: DC

## 2014-06-13 MED ORDER — IMIPRAMINE HCL 25 MG PO TABS: 25.0000 mg | ORAL_TABLET | Freq: Every day | ORAL | Status: DC

## 2014-06-13 NOTE — Progress Notes (Addendum)
[facility]  PATIENT: Sherri [name] DOB: 08/07/1940  REFERRING CLINICIAN: Unice Cobble  HISTORY FROM: patient REASON FOR VISIT: Multiple sclerosis   HISTORICAL  CHIEF COMPLAINT:  Chief Complaint  Patient presents with  . Multiple Sclerosis    Sts.is having more weakness in bilat legs, numbness left side of face onset one week ago.  Sts. she feels exacerbation of sx. is due to increased stress over relatonship with g'dtr, financil strain./fim    HISTORY OF PRESENT ILLNESS:  [name] is a 74 year old woman who was diagnosed with relapsing remitting MS about 5 years ago. However, she actually had symptoms for many more years. Specifically when she was in her late 61s she had an episode of right-sided numbness and she lost the use of her right arm for a couple of weeks. At that time, she was told that she might have MS. Over the next 30 or 40 years she had several more similar but milder episodes. About 5 years ago she was having a lot of neck and arm problems and had an MRI of the cervical spine. It was abnormal showing some foci were some for multiple sclerosis. An MRI of the brain was also performed at that time or shortly thereafter and showed white matter lesions consistent with the diagnosis. She did not require a lumbar puncture for diagnosis. Initially, she was placed on Avonex. She also has rheumatoid arthritis and was placed on methotrexate and then a combination of methotrexate with [name]. We decided to stop the Avonex as she probably would get some benefit from those medications for the MS as well. For the most part her MS has been stable over the past 5 years. However, a few weeks ago she began to experience numbness in the left face and weakness in her legs.  Currently, the main symptoms she has from the MS is the left facial numbness and the weakness in her legs. Her gait is a little wobbly at this time. She has had urinary frequency for many years.  It is no worse currently than it had been in the recent past.  Facial numbness involves the entire left side of the face, V1 V2 and V3.    The weakness in her legs is worse on the left. She notes very minimal weakness in the arms at times. The legs feel more weak than they do clumsy. She does not note any tingling in the limbs.  Her bladder has done worse since all of this has been going on. She has urinary frequency and more nocturia.   She does not take any medicine for the bladder at this time.  She has been under quite a bit more stress lately due to her granddaughter causing some financial issues.   She does not feel depressed but is very scared because of financial problems that occurred when she cosigned for her grand daugher. She is anxious and is having a lot more trouble sleeping, despite 5 mg diazepam and 10 mg oxycodone at bedtime --- she sleeps 2 hours then wakes up and can not fall back asleep.  She used to be more active walking a mile or more every day but now feels unable to do so.     She feels more fatigued than she used to. He was always very active in the past. She does not note significant cognitive changes.  REVIEW OF SYSTEMS:  Constitutional: No fevers, chills, sweats, or change in appetite Eyes: No visual changes, double vision,  eye pain Ear, nose and throat: No hearing loss, ear pain, nasal congestion, sore throat Cardiovascular: No chest pain, palpitations Respiratory:  No shortness of breath at rest or with exertion.   No wheezes GastrointestinaI: No nausea, vomiting, diarrhea, abdominal pain, fecal incontinence Genitourinary:  as above. Musculoskeletal:  No neck pain, back pain Integumentary: No rash, pruritus, skin lesions Neurological: as above Psychiatric: he has had a lot of family stress leading to some depression and more anxiety. Endocrine: No palpitations, diaphoresis, change in appetite, change in weigh or increased thirst Hematologic/Lymphatic:  No anemia,  purpura, petechiae. Allergic/Immunologic: No itchy/runny eyes, nasal congestion, recent allergic reactions, rashes  ALLERGIES: Allergies  Allergen Reactions  . Meperidine Hcl Shortness Of Breath  . Penicillins Hives  . Azathioprine Other (See Comments)    Weakness  . Darvon [Propoxyphene Hcl]     Lowered respirations greatly  . Ezetimibe-Simvastatin Nausea And Vomiting    HOME MEDICATIONS: Outpatient Prescriptions Prior to Visit  Medication Sig Dispense Refill  . Biotin 5000 MCG TABS Take 5,000 mcg by mouth daily.     . Calcium Carbonate-Vitamin D (CALCIUM + D PO) Take 1 tablet by mouth daily. 1200 mg daily    . carboxymethylcellulose (REFRESH PLUS) 0.5 % SOLN Place 1 drop into both eyes 2 (two) times daily.    . Cyanocobalamin (VITAMIN B-12 PO) Take 1 tablet by mouth daily.    [name] denosumab (PROLIA) 60 MG/ML SOLN injection Inject 60 mg into the skin every 6 (six) months. Administer in upper arm, thigh, or abdomen    . diazepam (VALIUM) 5 MG tablet Take 5 mg by mouth at bedtime.     . folic acid (FOLVITE) 1 MG tablet Take 1 mg by mouth 2 (two) times daily.    . furosemide (LASIX) 20 MG tablet TAKE ONE TABLET BY MOUTH ONCE DAILY 90 tablet 1  . mupirocin ointment (BACTROBAN) 2 % Applied twice a day to the affected area;NOT into eyes. 22 g 0  . Omega-3 Fatty Acids (OMEGA 3 PO) Take 1,000 mg by mouth daily.     [name] oxyCODONE-acetaminophen (PERCOCET/ROXICET) 5-325 MG per tablet Take 1-2 tablets by mouth every 4 (four) hours as needed for pain. 60 tablet 0  . pneumococcal 23 valent vaccine (PNEUMOVAX 23) 25 MCG/0.5ML injection Inject 0.5 mLs into the muscle once. 0.5 mL 0  . predniSONE (DELTASONE) 5 MG tablet Take 5 mg by mouth daily.     . Pyridoxine HCl (VITAMIN B-6 PO) Take 1 tablet by mouth daily.    . ramipril (ALTACE) 5 MG capsule Take 1 capsule (5 mg total) by mouth daily before breakfast. Office visit due 90 capsule 3  . TIZANIDINE HCL PO Take by mouth.    . Tofacitinib Citrate  (XELJANZ) 5 MG TABS Take 1 tablet by mouth 2 (two) times daily.    . traMADol (ULTRAM) 50 MG tablet Take 50-100 mg by mouth every 4 (four) hours. Maximum dose= 8 tablets per day    . Methotrexate Sodium, PF, 50 MG/2ML SOLN Inject 25 mg into the muscle once a week. Every Tuesday injects into thigh    . pantoprazole (PROTONIX) 40 MG tablet Take 1 tablet (40 mg total) by mouth daily. 30 tablet 10   No facility-administered medications prior to visit.    PAST MEDICAL HISTORY: Past Medical History  Diagnosis Date  . Nonspecific abnormal electrocardiogram (ECG) (EKG)     EKG is normal but reveals early repolarization ST-T changes.  . Rheumatoid arthritis(714.0)  Dr Estanislado Pandy   . OA (osteoarthritis)     Dr Lynann Bologna  . Neuromuscular disorder     Dr. Park Liter- in [place]- [facility] follows M.S.  . Hypertension     stress test many yrs. ago  . GERD (gastroesophageal reflux disease)     +reflux, on occas, no Rx  . Cancer     basal cell ca, in situ- uterine   . PONV (postoperative nausea and vomiting)   . Multiple sclerosis   . Headache(784.0)   . Skin cancer   . Hearing loss   . Movement disorder   . Vision abnormalities     PAST SURGICAL HISTORY: Past Surgical History  Procedure Laterality Date  . Lumbar fusion      Dr Joya Salm  . Appendectomy      with TAH  . Cervical fusion      Dr Joya Salm  . Carpal tunnel release    . Chest tube insertion      for traumatic Pneumothorax  . Colonoscopy w/ polypectomy  1997    negative since; Dr Deatra Ina  . Thyroid surgery      R lobe removed- 1972  . Abdominal hysterectomy      1985  . Nasal sinus surgery    . Total hip arthroplasty  12/22/2011    Procedure: TOTAL HIP ARTHROPLASTY;  Surgeon: Kerin Salen, MD;  Location: [facility];  Service: Orthopedics;  Laterality: Left;  . Cervical fusion  12/31/2012    Dr Joya Salm  . Posterior cervical fusion/foraminotomy N/A 12/31/2012    Procedure: POSTERIOR LATERAL CERVICAL FUSION/FORAMINOTOMY LEVEL 1  CERVICAL THREE-FOUR WITH LATERAL MASS SCREWS;  Surgeon: Floyce Stakes, MD;  Location: [facility];  Service: Neurosurgery;  Laterality: N/A;  . Colonoscopy  07/16/2010    normal   . Esophagogastroduodenoscopy  07/16/2010    normal  . Skin biopsy      FAMILY HISTORY: Family History  Problem Relation Age of Onset  . Cancer Mother     pancreatic  . Diabetes Mother   . Heart disease Father     Rheumatic  . Cancer Father     ? stomach  . Cancer Sister     stomach  . Cancer Maternal Aunt     X 4; ? primary  . Heart disease Paternal Aunt   . Hypertension Neg Hx   . Stroke Neg Hx   . Cancer Maternal Grandmother     cervical  . Colon cancer      Aunts  . Multiple sclerosis Daughter     SOCIAL HISTORY:  History   Social History  . Marital Status: Married    Spouse Name: N/A    Number of Children: N/A  . Years of Education: N/A   Occupational History  . retired    Social History Main Topics  . Smoking status: Former Smoker    Quit date: 05/26/1981  . Smokeless tobacco: Not on file     Comment: Smoked [phone], up to 2.5 ppd  . Alcohol Use: 4.2 oz/week    7 Cans of beer per week     Comment:  2% beer   . Drug Use: No  . Sexual Activity: Not on file   Other Topics Concern  . Not on file   Social History Narrative     PHYSICAL EXAM  Filed Vitals:   06/13/14 1413  BP: 146/80  Pulse: 78  Resp: 14  Weight: 116 lb (52.617 kg)    Body mass  index is 21.21 kg/(m^2).   General: The patient is well-developed and well-nourished and in no acute distress  Eyes:  Funduscopic exam shows normal optic discs and retinal vessels.  Neck: The neck is supple, no carotid bruits are noted.  The neck is nontender.  Respiratory: The respiratory examination is clear.  Cardiovascular: The cardiovascular examination reveals a regular rate and rhythm, no murmurs, gallops or rubs are noted.  Skin: Extremities are without significant edema.  Neurologic Exam  Mental  status: The patient is alert and oriented x 3 at the time of the examination. The patient has apparent normal recent and remote memory, with an apparently normal attention span and concentration ability.   Speech is normal.  Cranial nerves: Extraocular movements are full. Pupils are equal, round, and reactive to light and accomodation.  Visual fields are full.  Facial symmetry is present. There is good facial sensation to soft touch bilaterally.Facial strength is normal.  Trapezius and sternocleidomastoid strength is normal. No dysarthria is noted.  The tongue is midline, and the patient has symmetric elevation of the soft palate. No obvious hearing deficits are noted.  Motor:  Muscle bulk is normal and tone is mildly increased in her legs. Strength is  5 / 5 in all 4 extremities.   Sensory: Sensory testing is intact to pinprick, soft touch, vibration sensation, and position sense on all 4 extremities.  Coordination: Cerebellar testing reveals good finger-nose-finger and heel-to-shin bilaterally.  Gait and station: her station is stable with the eyes open but she also denies closed. Gait was wide. She was unable to do tandem walk. The change in gait was appreciable when compared to her prior examination.l. Romberg is negative.   Reflexes: Deep tendon reflexes are symmetric and normal bilaterally. Plantar responses are normal.    DIAGNOSTIC DATA (LABS, IMAGING, TESTING) - I reviewed patient records, labs, notes, testing and imaging myself where available.  Lab Results  Component Value Date   WBC 7.1 12/30/2012   HGB 13.8 12/30/2012   HCT 39.3 12/30/2012   MCV 96.8 12/30/2012   PLT 274 12/30/2012      Component Value Date/Time   NA 135 12/30/2012 1434   K 4.3 12/30/2012 1434   CL 96 12/30/2012 1434   CO2 27 12/30/2012 1434   GLUCOSE 97 12/30/2012 1434   BUN 11 12/30/2012 1434   CREATININE 0.76 12/30/2012 1434   CALCIUM 9.6 12/30/2012 1434   PROT 6.2 11/16/2007 0811   ALBUMIN 3.7  11/16/2007 0811   AST 19 11/16/2007 0811   ALT 19 11/16/2007 0811   ALKPHOS 38* 11/16/2007 0811   BILITOT 0.5 11/16/2007 0811   GFRNONAA 83* 12/30/2012 1434   GFRAA >90 12/30/2012 1434   Lab Results  Component Value Date   CHOL 233* 11/15/2012   HDL 98.70 11/15/2012   LDLCALC 93 11/11/2011   LDLDIRECT 122.9 11/15/2012   TRIG 62.0 11/15/2012   CHOLHDL 2 11/15/2012   Lab Results  Component Value Date   HGBA1C 5.6 10/24/2013   No results found for: VITAMINB12 Lab Results  Component Value Date   TSH 2.85 10/24/2013        ASSESSMENT AND PLAN  Multiple sclerosis - Plan: MR Brain W Wo Contrast, MR Cervical Spine W Wo Contrast  Rheumatoid arthritis  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  Chronic fatigue  Nocturia  Gait disturbance - Plan: MR Brain W Wo Contrast, MR Cervical Spine W Wo Contrast  Depression with anxiety    In summary, Mrs. Vanlue  is a 74 year old woman with multiple sclerosis who has had more difficulty with her gait in the past 2 months. I am most concerned that she had an exacerbation of her MS. However, as she has known cervical degenerative changes we must also consider that she has or spinal disease causing a myelopathy. To help sort these 2 possibilities out that would require different treatment and to assess for subclinical progression of her MS, I will order an MRI of the brain and cervical spine both with and without contrast.  She also has had more depression and anxiety, mostly due to severe family stress that has lead to financial stress.Valium has made her sleepy. I will add BuSpar 15 mg by mouth twice a day in the hopes that that will help her anxiety but not cause her to be sleepy during the daytime.  She will return to see me in 4 months, sooner if she has new or worsening neurologic issues.   45 minutes was spent face-to-face with greater than 50% of the time counseling and coordinating care about her MS and psychiatric  concerns.    Richard A. Felecia Shelling, MD, PhD 0/86/5784, 6:96 PM Certified in Neurology, Clinical Neurophysiology, Sleep Medicine, Pain Medicine and Neuroimaging  [facility] [address], [place], [name] [address] [phone]

## 2014-06-27 ENCOUNTER — Other Ambulatory Visit: Payer: Self-pay | Admitting: Neurology

## 2014-06-27 ENCOUNTER — Other Ambulatory Visit: Payer: Self-pay

## 2014-06-27 MED ORDER — OXYCODONE HCL 10 MG PO TABS: 10.0000 mg | ORAL_TABLET | Freq: Two times a day (BID) | ORAL | Status: DC | PRN

## 2014-06-27 NOTE — Telephone Encounter (Signed)
Patient requesting Oxycodone HCl 10 MG TABS refilled.

## 2014-06-27 NOTE — Telephone Encounter (Signed)
Med refill

## 2014-06-28 DIAGNOSIS — D2370 Other benign neoplasm of skin of unspecified lower limb, including hip: Secondary | ICD-10-CM | POA: Diagnosis not present

## 2014-07-04 DIAGNOSIS — M79641 Pain in right hand: Secondary | ICD-10-CM | POA: Diagnosis not present

## 2014-07-04 DIAGNOSIS — M0579 Rheumatoid arthritis with rheumatoid factor of multiple sites without organ or systems involvement: Secondary | ICD-10-CM | POA: Diagnosis not present

## 2014-07-04 DIAGNOSIS — M25571 Pain in right ankle and joints of right foot: Secondary | ICD-10-CM | POA: Diagnosis not present

## 2014-07-04 DIAGNOSIS — M7072 Other bursitis of hip, left hip: Secondary | ICD-10-CM | POA: Diagnosis not present

## 2014-07-07 DIAGNOSIS — H0012 Chalazion right lower eyelid: Secondary | ICD-10-CM | POA: Diagnosis not present

## 2014-07-07 DIAGNOSIS — H0011 Chalazion right upper eyelid: Secondary | ICD-10-CM | POA: Diagnosis not present

## 2014-07-10 DIAGNOSIS — M25552 Pain in left hip: Secondary | ICD-10-CM | POA: Diagnosis not present

## 2014-07-10 DIAGNOSIS — M461 Sacroiliitis, not elsewhere classified: Secondary | ICD-10-CM | POA: Diagnosis not present

## 2014-07-10 DIAGNOSIS — M0579 Rheumatoid arthritis with rheumatoid factor of multiple sites without organ or systems involvement: Secondary | ICD-10-CM | POA: Diagnosis not present

## 2014-07-10 DIAGNOSIS — Z79899 Other long term (current) drug therapy: Secondary | ICD-10-CM | POA: Diagnosis not present

## 2014-07-11 ENCOUNTER — Ambulatory Visit
Admission: RE | Admit: 2014-07-11 | Discharge: 2014-07-11 | Disposition: A | Discharge status: Home / Self Care | Payer: Medicare Other | Source: Ambulatory Visit | Attending: Neurology | Admitting: Neurology

## 2014-07-11 DIAGNOSIS — R269 Unspecified abnormalities of gait and mobility: Secondary | ICD-10-CM | POA: Diagnosis not present

## 2014-07-11 DIAGNOSIS — G35 Multiple sclerosis: Secondary | ICD-10-CM

## 2014-07-11 MED ORDER — GADOBENATE DIMEGLUMINE 529 MG/ML IV SOLN
10.0000 mL | Freq: Once | INTRAVENOUS | Status: AC | PRN
Start: 2014-07-11 — End: 2014-07-11
  Administered 2014-07-11: 10 mL via INTRAVENOUS

## 2014-07-12 ENCOUNTER — Telehealth: Payer: Self-pay | Admitting: *Deleted

## 2014-07-12 ENCOUNTER — Telehealth: Payer: Self-pay | Admitting: Neurology

## 2014-07-12 NOTE — Telephone Encounter (Signed)
Spoke with [name] and per RAS, advised mri brain/c-spine show no new ms lesions.  She verbalized understanding of same/fim

## 2014-07-12 NOTE — Telephone Encounter (Signed)
Pt is calling stating she had her MRI yesterday and she would like to talk with someone about her results.  She got her results off of My Chart and she does not understand them.  Please call and advise.

## 2014-07-12 NOTE — Telephone Encounter (Signed)
See result note.  I spoke with [name] and per RAS, advised mri brain/c-spine showed no new ms lesions.  She verbalized understanding of same/fim

## 2014-07-13 DIAGNOSIS — M810 Age-related osteoporosis without current pathological fracture: Secondary | ICD-10-CM | POA: Diagnosis not present

## 2014-07-14 DIAGNOSIS — R5381 Other malaise: Secondary | ICD-10-CM | POA: Diagnosis not present

## 2014-08-07 DIAGNOSIS — Z79899 Other long term (current) drug therapy: Secondary | ICD-10-CM | POA: Diagnosis not present

## 2014-08-07 DIAGNOSIS — M2550 Pain in unspecified joint: Secondary | ICD-10-CM | POA: Diagnosis not present

## 2014-08-14 ENCOUNTER — Other Ambulatory Visit: Payer: Self-pay | Admitting: Neurology

## 2014-08-14 NOTE — Telephone Encounter (Signed)
Pt requesting refills for Rx's diazepam (VALIUM) 5 MG tablet, oxyCODONE-acetaminophen (PERCOCET/ROXICET) 5-325 MG per tablet, and traMADol (ULTRAM) 50 MG tablet.  Please call when ready for pick up.

## 2014-08-15 DIAGNOSIS — N312 Flaccid neuropathic bladder, not elsewhere classified: Secondary | ICD-10-CM | POA: Diagnosis not present

## 2014-08-15 DIAGNOSIS — N390 Urinary tract infection, site not specified: Secondary | ICD-10-CM | POA: Diagnosis not present

## 2014-08-15 MED ORDER — TRAMADOL HCL 50 MG PO TABS: 50.0000 mg | ORAL_TABLET | ORAL | Status: DC

## 2014-08-15 MED ORDER — OXYCODONE HCL 10 MG PO TABS: 10.0000 mg | ORAL_TABLET | Freq: Two times a day (BID) | ORAL | Status: DC | PRN

## 2014-08-15 MED ORDER — DIAZEPAM 5 MG PO TABS: 5.0000 mg | ORAL_TABLET | Freq: Every day | ORAL | Status: DC

## 2014-08-16 DIAGNOSIS — M62838 Other muscle spasm: Secondary | ICD-10-CM | POA: Diagnosis not present

## 2014-08-16 DIAGNOSIS — G8929 Other chronic pain: Secondary | ICD-10-CM | POA: Diagnosis not present

## 2014-08-16 DIAGNOSIS — M6281 Muscle weakness (generalized): Secondary | ICD-10-CM | POA: Diagnosis not present

## 2014-08-16 DIAGNOSIS — M2550 Pain in unspecified joint: Secondary | ICD-10-CM | POA: Diagnosis not present

## 2014-08-16 DIAGNOSIS — R278 Other lack of coordination: Secondary | ICD-10-CM | POA: Diagnosis not present

## 2014-08-21 DIAGNOSIS — R278 Other lack of coordination: Secondary | ICD-10-CM | POA: Diagnosis not present

## 2014-08-21 DIAGNOSIS — M6281 Muscle weakness (generalized): Secondary | ICD-10-CM | POA: Diagnosis not present

## 2014-08-21 DIAGNOSIS — M62838 Other muscle spasm: Secondary | ICD-10-CM | POA: Diagnosis not present

## 2014-08-23 DIAGNOSIS — M2550 Pain in unspecified joint: Secondary | ICD-10-CM | POA: Diagnosis not present

## 2014-08-23 DIAGNOSIS — M62838 Other muscle spasm: Secondary | ICD-10-CM | POA: Diagnosis not present

## 2014-08-23 DIAGNOSIS — M6281 Muscle weakness (generalized): Secondary | ICD-10-CM | POA: Diagnosis not present

## 2014-08-23 DIAGNOSIS — R278 Other lack of coordination: Secondary | ICD-10-CM | POA: Diagnosis not present

## 2014-08-28 DIAGNOSIS — M62838 Other muscle spasm: Secondary | ICD-10-CM | POA: Diagnosis not present

## 2014-08-28 DIAGNOSIS — M2550 Pain in unspecified joint: Secondary | ICD-10-CM | POA: Diagnosis not present

## 2014-08-28 DIAGNOSIS — M6281 Muscle weakness (generalized): Secondary | ICD-10-CM | POA: Diagnosis not present

## 2014-08-28 DIAGNOSIS — R278 Other lack of coordination: Secondary | ICD-10-CM | POA: Diagnosis not present

## 2014-08-30 DIAGNOSIS — M62838 Other muscle spasm: Secondary | ICD-10-CM | POA: Diagnosis not present

## 2014-08-30 DIAGNOSIS — R278 Other lack of coordination: Secondary | ICD-10-CM | POA: Diagnosis not present

## 2014-08-30 DIAGNOSIS — M6281 Muscle weakness (generalized): Secondary | ICD-10-CM | POA: Diagnosis not present

## 2014-08-30 DIAGNOSIS — M2550 Pain in unspecified joint: Secondary | ICD-10-CM | POA: Diagnosis not present

## 2014-09-01 ENCOUNTER — Other Ambulatory Visit: Payer: Self-pay | Admitting: Internal Medicine

## 2014-09-04 DIAGNOSIS — M62838 Other muscle spasm: Secondary | ICD-10-CM | POA: Diagnosis not present

## 2014-09-04 DIAGNOSIS — M6281 Muscle weakness (generalized): Secondary | ICD-10-CM | POA: Diagnosis not present

## 2014-09-04 DIAGNOSIS — M2550 Pain in unspecified joint: Secondary | ICD-10-CM | POA: Diagnosis not present

## 2014-09-04 DIAGNOSIS — R278 Other lack of coordination: Secondary | ICD-10-CM | POA: Diagnosis not present

## 2014-09-06 DIAGNOSIS — R29818 Other symptoms and signs involving the nervous system: Secondary | ICD-10-CM | POA: Diagnosis not present

## 2014-09-06 DIAGNOSIS — M62838 Other muscle spasm: Secondary | ICD-10-CM | POA: Diagnosis not present

## 2014-09-06 DIAGNOSIS — M6281 Muscle weakness (generalized): Secondary | ICD-10-CM | POA: Diagnosis not present

## 2014-09-06 DIAGNOSIS — R3912 Poor urinary stream: Secondary | ICD-10-CM | POA: Diagnosis not present

## 2014-09-06 DIAGNOSIS — R278 Other lack of coordination: Secondary | ICD-10-CM | POA: Diagnosis not present

## 2014-09-08 ENCOUNTER — Encounter: Payer: Self-pay | Admitting: Internal Medicine

## 2014-09-08 ENCOUNTER — Ambulatory Visit (INDEPENDENT_AMBULATORY_CARE_PROVIDER_SITE_OTHER): Payer: Medicare Other | Admitting: Internal Medicine

## 2014-09-08 ENCOUNTER — Other Ambulatory Visit: Payer: Self-pay | Admitting: Internal Medicine

## 2014-09-08 VITALS — BP 122/72 | HR 66 | Temp 98.0°F | Ht 62.0 in | Wt 117.8 lb

## 2014-09-08 DIAGNOSIS — Z23 Encounter for immunization: Secondary | ICD-10-CM

## 2014-09-08 DIAGNOSIS — E785 Hyperlipidemia, unspecified: Secondary | ICD-10-CM

## 2014-09-08 NOTE — Progress Notes (Signed)
   Subjective:    Patient ID: Sherri [name], female    DOB: [birth year], 74 y.o.   MRN: [id number]  HPI A FNP-C from [place] made a house call and recommended annual dilated eye exam;  DPT vaccination; and Prevnar. She also recommended evaluation of GFR. Glaucoma screen was recommended along with an exercise program.  She sees Dr. Tyrone Schimke, Ophthalmologist. She has cataracts and may have an extraction in October 2016. Her Rheumatologist monitors her kidney function. Her A1c was 5.6% in June 2015.  She is on a heart healthy diet. She does chair yoga twice a week without cardiopulmonary symptoms.  Advanced cholesterol testing in 2010 revealed that her LDL goal is 120, ideally less than 90. There is no family history of heart attack or stroke.  Review of Systems Chest pain, palpitations, tachycardia, exertional dyspnea, paroxysmal nocturnal dyspnea, claudication or edema are absent.      Objective:   Physical Exam Thin but appears healthy and well-nourished & in no acute distress  No carotid bruits are present.No neck vein distention present at 10 - 15 degrees. Thyroid normal to palpation  Heart rhythm and rate are normal with no gallop or murmur  Chest is clear with no increased work of breathing  There is no evidence of aortic aneurysm or renal artery bruits  Abdomen soft with no organomegaly or masses. No HJR  No clubbing, cyanosis or edema present.  Pedal pulses are intact .DPP mildly decreased  No ischemic skin changes are present . Fingernails/ toenails healthy   Alert and oriented. Strength, tone, DTRs reflexes normal        Assessment & Plan:  See Current Assessment & Plan in Problem List under specific Diagnosis

## 2014-09-08 NOTE — Patient Instructions (Signed)
  Your next office appointment will be determined based upon review of your pending labs   Those instructions will be transmitted to you by My Chart  Critical results will be called.

## 2014-09-08 NOTE — Progress Notes (Signed)
Pre visit review using our clinic review tool, if applicable. No additional management support is needed unless otherwise documented below in the visit note. 

## 2014-09-08 NOTE — Assessment & Plan Note (Signed)
Lipids , TSH 

## 2014-09-10 ENCOUNTER — Encounter: Payer: Self-pay | Admitting: Internal Medicine

## 2014-09-11 ENCOUNTER — Other Ambulatory Visit (INDEPENDENT_AMBULATORY_CARE_PROVIDER_SITE_OTHER): Payer: Medicare Other

## 2014-09-11 DIAGNOSIS — E785 Hyperlipidemia, unspecified: Secondary | ICD-10-CM | POA: Diagnosis not present

## 2014-09-11 LAB — LIPID PANEL
CHOLESTEROL: 266 mg/dL — AB (ref 0–200)
HDL: 81.2 mg/dL (ref >39.00)
LDL CALC: 164 mg/dL — AB (ref 0–99)
NonHDL: 184.8
TRIGLYCERIDES: 105 mg/dL (ref 0.0–149.0)
Total CHOL/HDL Ratio: 3
VLDL: 21 mg/dL (ref 0.0–40.0)

## 2014-09-11 LAB — TSH: TSH: 3.79 u[IU]/mL (ref 0.35–4.50)

## 2014-09-12 ENCOUNTER — Encounter: Payer: Self-pay | Admitting: Internal Medicine

## 2014-09-18 DIAGNOSIS — G8929 Other chronic pain: Secondary | ICD-10-CM | POA: Diagnosis not present

## 2014-09-18 DIAGNOSIS — M2550 Pain in unspecified joint: Secondary | ICD-10-CM | POA: Diagnosis not present

## 2014-09-18 DIAGNOSIS — R278 Other lack of coordination: Secondary | ICD-10-CM | POA: Diagnosis not present

## 2014-09-18 DIAGNOSIS — M62838 Other muscle spasm: Secondary | ICD-10-CM | POA: Diagnosis not present

## 2014-09-18 DIAGNOSIS — M6281 Muscle weakness (generalized): Secondary | ICD-10-CM | POA: Diagnosis not present

## 2014-09-19 ENCOUNTER — Ambulatory Visit (INDEPENDENT_AMBULATORY_CARE_PROVIDER_SITE_OTHER): Payer: Medicare Other | Admitting: Internal Medicine

## 2014-09-19 ENCOUNTER — Encounter: Payer: Self-pay | Admitting: Internal Medicine

## 2014-09-19 VITALS — BP 118/70 | HR 88 | Temp 98.0°F | Ht 62.0 in | Wt 116.5 lb

## 2014-09-19 DIAGNOSIS — M069 Rheumatoid arthritis, unspecified: Secondary | ICD-10-CM | POA: Diagnosis not present

## 2014-09-19 DIAGNOSIS — E785 Hyperlipidemia, unspecified: Secondary | ICD-10-CM | POA: Diagnosis not present

## 2014-09-19 MED ORDER — ATORVASTATIN CALCIUM 10 MG PO TABS: ORAL_TABLET | ORAL | Status: DC

## 2014-09-19 NOTE — Progress Notes (Signed)
Pre visit review using our clinic review tool, if applicable. No additional management support is needed unless otherwise documented below in the visit note. 

## 2014-09-19 NOTE — Patient Instructions (Addendum)
   Lipitor (atorvastatin ) 10 mg will be prescribed once weekly on Wednesday. After 10 weeks; please repeat fasting lipids, liver function tests, and CK. If these coincide with tests done by Dr. Lucien Mons can be completed there.

## 2014-09-19 NOTE — Assessment & Plan Note (Addendum)
Lipitor 10 mg once weekly Fasting labs (lipids, LFTs,CK) after 10 weeks Continue TLC program

## 2014-09-20 ENCOUNTER — Telehealth: Payer: Self-pay

## 2014-09-20 NOTE — Progress Notes (Signed)
   Subjective:    Patient ID: Sherri [name], female    DOB: 1940-10-09, 74 y.o.   MRN: [id number]  HPI  She came in to discuss her elevated LDL;it has risen from 62 in 2013 to 164 despite no change in her level of activity or nutritional program .Her exercise is limited by RA and Multiple Sclerosis. She is on a heart healthy diet.She questions whether the rise may be due to [place] taken for her Rheumatoid Arthritis. This agent has affected her quality of life positively.  No family history of premature CAD/MI . Her father died @ 35,possibly related to a hemorrhage.  Based on her prior advanced testing, her LDL goal is <120 , ideally < 90. Her present LDL increases long term heart attack or stroke risk almost 45 %.  She previously had GI intolerance to Vytorin, probably due to the Zetia component.   Review of Systems  Chest pain, palpitations, tachycardia, exertional dyspnea, paroxysmal nocturnal dyspnea, claudication or edema are absent.      Objective:   Physical Exam  No exam; the entire visit encompassed discussing labs ;risks & options. Diagrams were provided.      Assessment & Plan:  See Current Assessment & Plan in Problem List under specific Diagnosis

## 2014-09-20 NOTE — Telephone Encounter (Signed)
-----   Message from Hendricks Limes, MD sent at 09/20/2014  6:42 AM EDT ----- Please FAX office visit & lab results to Dr Bo Merino

## 2014-09-20 NOTE — Telephone Encounter (Signed)
Office note and labs have been faxed

## 2014-09-20 NOTE — Assessment & Plan Note (Signed)
Positive response precludes change in [place]

## 2014-09-22 ENCOUNTER — Telehealth: Payer: Self-pay | Admitting: Neurology

## 2014-09-22 NOTE — Telephone Encounter (Signed)
Patient called and stated she has questions about her Rx. Oxycodone HCl 10 MG TABS. Please call and advise.

## 2014-09-22 NOTE — Telephone Encounter (Signed)
I have spoken with Sherri [name]--extended, pleasant conversation.  She takes nightly Oxycodone and Diazepam for chronic pain and rls.  She has been stable on the same dose of each med, does not take more than rx'd, and is not asking for an increase in meds.  She sts. what she takes is effective for her, but that she has seen recent news reports regarding opioid addiction and wants to know if she will become addicted.  We discussed that anytime an opioid med is taken on a regular basis, the body will become dependent on it.  We also discussed the difference between that dependency and addictive behavior in which these meds are used incorrectly for purposes other than what they are rx'd for.  [name] verbalized understanding of same, sts. she feels better with this reassurance, and will discuss further at next f/u if needed/fim

## 2014-09-26 ENCOUNTER — Ambulatory Visit (INDEPENDENT_AMBULATORY_CARE_PROVIDER_SITE_OTHER): Payer: Medicare Other | Admitting: Internal Medicine

## 2014-09-26 ENCOUNTER — Encounter: Payer: Self-pay | Admitting: Internal Medicine

## 2014-09-26 VITALS — BP 140/80 | HR 79 | Temp 97.7°F | Resp 15 | Ht 62.0 in | Wt 117.8 lb

## 2014-09-26 DIAGNOSIS — W5501XA Bitten by cat, initial encounter: Secondary | ICD-10-CM | POA: Diagnosis not present

## 2014-09-26 DIAGNOSIS — L570 Actinic keratosis: Secondary | ICD-10-CM | POA: Diagnosis not present

## 2014-09-26 DIAGNOSIS — S51802A Unspecified open wound of left forearm, initial encounter: Secondary | ICD-10-CM

## 2014-09-26 MED ORDER — DOXYCYCLINE HYCLATE 100 MG PO TABS: 100.0000 mg | ORAL_TABLET | Freq: Two times a day (BID) | ORAL | Status: DC

## 2014-09-26 MED ORDER — METRONIDAZOLE 500 MG PO TABS: 500.0000 mg | ORAL_TABLET | Freq: Three times a day (TID) | ORAL | Status: DC

## 2014-09-26 NOTE — Progress Notes (Signed)
Pre visit review using our clinic review tool, if applicable. No additional management support is needed unless otherwise documented below in the visit note. 

## 2014-09-26 NOTE — Progress Notes (Signed)
   Subjective:    Patient ID: Sherri [name], female    DOB: [birth year], 74 y.o.   MRN: [id number]  HPI  This morning she was in bed when her pet cat age 57 bit her. She felt that the animal wanted her to get out of bed.  She feels the cat is agitated as she has adopted her daughter's cat who has diabetes. Sherri [name] cat is actually on Prozac. The cat's shots are up-to-date. The daughter cannot afford to the diabetic treatments. Today the patient has spent $500 on said treatment  She tied to clean the wounds with soap and water.She's having ongoing pain.  Her tetanus shot was last administered in January 2012.   Review of Systems There is no associated fever, chills, or sweats. There is been no extension of localized erythema since initial bite.     Objective:   Physical Exam  General appearance :thin but adequately nourished; in no distress.  Eyes: No conjunctival inflammation or scleral icterus is present.  Heart:  Normal rate and regular rhythm. S1 and S2 normal without gallop, murmur, click, rub or other extra sounds    Lungs:Chest clear to auscultation; no wheezes, rhonchi,rales ,or rubs present.No increased work of breathing.   Vascular : radial pulses equal ; no bruits present.  Skin:Warm & dry.  Intact without suspicious lesions or rashes ; no tenting or jaundice . She has a 5.5 x 5 area of edema and faint erythema in which there are 3 puncture wounds over the left forearm. There is no pustule formation  Lymphatic: No lymphadenopathy is noted about the head, neck, axilla, or epitrochlear areas.   Neuro: Strength, tone decreased      Assessment & Plan:  #1 cat bite  See orders and after visit summary

## 2014-09-26 NOTE — Patient Instructions (Addendum)
Preferred antibiotic therapy to treat cat bites would be Augmentin/clavulanate; this is not an option with penicillin allergy.  Second line options will be doxycycline 100 mg twice a day to cover the organism P. multocida. While on this direct sun exposure must be avoided.  Metronidazole 500 mg 3 times a day would be recommended to cover the anaerobes, bacteria which do not require oxygen for growth. Please report warning signs as we discussed. Worrisome would be red streaks up the extremity, increased pain, fever, or pus production.

## 2014-09-27 ENCOUNTER — Encounter: Payer: Self-pay | Admitting: Internal Medicine

## 2014-09-28 ENCOUNTER — Telehealth: Payer: Self-pay | Admitting: Internal Medicine

## 2014-09-28 NOTE — Telephone Encounter (Signed)
Patient called back regarding this cat bite and medication metroNIDAZOLE (FLAGYL) 500 MG tablet [[id number] is making her sick. The bite is still looking bad and her arm from elbow to hand is really red.

## 2014-09-28 NOTE — Telephone Encounter (Signed)
error 

## 2014-10-04 DIAGNOSIS — Z79899 Other long term (current) drug therapy: Secondary | ICD-10-CM | POA: Diagnosis not present

## 2014-10-06 ENCOUNTER — Telehealth: Payer: Self-pay | Admitting: Neurology

## 2014-10-06 NOTE — Telephone Encounter (Signed)
Patient called that she has huge knot between shoulder and elbow on her right arm. This occurred about 3 days ago and she states that she is in a lot pain and can not reach for anything now. Please call and advise. Patient can be reached @ [phone]

## 2014-10-09 DIAGNOSIS — N312 Flaccid neuropathic bladder, not elsewhere classified: Secondary | ICD-10-CM | POA: Diagnosis not present

## 2014-10-09 DIAGNOSIS — M62838 Other muscle spasm: Secondary | ICD-10-CM | POA: Diagnosis not present

## 2014-10-09 DIAGNOSIS — R278 Other lack of coordination: Secondary | ICD-10-CM | POA: Diagnosis not present

## 2014-10-09 DIAGNOSIS — M6281 Muscle weakness (generalized): Secondary | ICD-10-CM | POA: Diagnosis not present

## 2014-10-09 NOTE — Telephone Encounter (Signed)
I have spoken with Sherri [name]--she c/o sudden onset of pain right tricep area, while putting a plate in the microwave on Friday 10-06-14.  Pain is some better but now she has a large know in that area.  Appt. given tomorrow 2pm/fim

## 2014-10-10 ENCOUNTER — Encounter: Payer: Self-pay | Admitting: Neurology

## 2014-10-10 ENCOUNTER — Other Ambulatory Visit: Payer: Self-pay | Admitting: Orthopedic Surgery

## 2014-10-10 ENCOUNTER — Ambulatory Visit (INDEPENDENT_AMBULATORY_CARE_PROVIDER_SITE_OTHER): Payer: Medicare Other | Admitting: Neurology

## 2014-10-10 VITALS — BP 142/72 | HR 76 | Resp 12 | Ht 62.0 in | Wt 116.8 lb

## 2014-10-10 DIAGNOSIS — R351 Nocturia: Secondary | ICD-10-CM | POA: Diagnosis not present

## 2014-10-10 DIAGNOSIS — G35 Multiple sclerosis: Secondary | ICD-10-CM | POA: Diagnosis not present

## 2014-10-10 DIAGNOSIS — S46219A Strain of muscle, fascia and tendon of other parts of biceps, unspecified arm, initial encounter: Secondary | ICD-10-CM | POA: Insufficient documentation

## 2014-10-10 DIAGNOSIS — S46111A Strain of muscle, fascia and tendon of long head of biceps, right arm, initial encounter: Secondary | ICD-10-CM | POA: Diagnosis not present

## 2014-10-10 DIAGNOSIS — G4700 Insomnia, unspecified: Secondary | ICD-10-CM | POA: Insufficient documentation

## 2014-10-10 DIAGNOSIS — M25511 Pain in right shoulder: Secondary | ICD-10-CM

## 2014-10-10 NOTE — Progress Notes (Signed)
[facility]  PATIENT: Sherri [name] DOB: 1941-04-30     HISTORICAL  CHIEF COMPLAINT:  Chief Complaint  Patient presents with  . Multiple Sclerosis    Sts. she was putting a plate with 2 whole potatoes on it into the microwave last Friday--had sudden onset of pain, large area of edem right bicep.  "It was like a rubber band broke."  If she needs a referral to ortho, she has seen Dr. Charissa Bash with [facility] [place] phone # [phone]./fim   . Right arm pain    HISTORY OF PRESENT ILLNESS:  On Friday night, she was putting a cooking tray with potatoes into the oven when she had sudden severe pain in right upper arm.    Pain was very severe and the biceps tuened into a large painful lump.    She has a lot of problems with elbow flexion.    Still having pain but tizanidine and voltaren cream has helped some.     She has seen Dr. Adline Peals at [facility] when she had right arm fractures.    She is getting a physical therapy for bladder incontinence at [facility] and is getting a benefit.     Insomnia/restless legs is better with combination valium 5 mg and oxycodone 10 mg (worked much longer than 5 mg)  ROS:  Out of a complete 14 system review of symptoms, the patient complains only of the following symptoms, and all other reviewed systems are negative.  Right arm pain, insomnia, bladder frequency   ALLERGIES: Allergies  Allergen Reactions  . Meperidine Hcl Shortness Of Breath  . Penicillins Hives  . Azathioprine Other (See Comments)    Weakness  . Darvon [Propoxyphene Hcl]     Lowered respirations greatly  . Ezetimibe-Simvastatin Nausea And Vomiting    HOME MEDICATIONS:  Current outpatient prescriptions:  .  atorvastatin (LIPITOR) 10 MG tablet, 1 pill every Weds;fasting labs after 10 weeks, Disp: 30 tablet, Rfl: 0 .  Biotin 5000 MCG TABS, Take 5,000 mcg by mouth daily. , Disp: , Rfl:  .  Calcium Carbonate-Vitamin D (CALCIUM + D PO), Take 1  tablet by mouth daily. 1200 mg daily, Disp: , Rfl:  .  Cyanocobalamin (VITAMIN B-12 PO), Take 1 tablet by mouth daily., Disp: , Rfl:  .  denosumab (PROLIA) 60 MG/ML SOLN injection, Inject 60 mg into the skin every 6 (six) months. Administer in upper arm, thigh, or abdomen, Disp: , Rfl:  .  diazepam (VALIUM) 5 MG tablet, Take 1 tablet (5 mg total) by mouth at bedtime., Disp: 30 tablet, Rfl: 5 .  doxycycline (VIBRA-TABS) 100 MG tablet, Take 1 tablet (100 mg total) by mouth 2 (two) times daily., Disp: 14 tablet, Rfl: 0 .  folic acid (FOLVITE) 1 MG tablet, Take 1 mg by mouth 2 (two) times daily., Disp: , Rfl:  .  furosemide (LASIX) 20 MG tablet, TAKE 1 TABLET BY MOUTH EVERY DAY, Disp: 90 tablet, Rfl: 1 .  methotrexate (RHEUMATREX) 2.5 MG tablet, , Disp: , Rfl: 2 .  metroNIDAZOLE (FLAGYL) 500 MG tablet, Take 1 tablet (500 mg total) by mouth 3 (three) times daily., Disp: 21 tablet, Rfl: 0 .  Omega-3 Fatty Acids (OMEGA 3 PO), Take 1,000 mg by mouth daily. , Disp: , Rfl:  .  Oxycodone HCl 10 MG TABS, Take 1 tablet (10 mg total) by mouth 2 (two) times daily as needed., Disp: 60 tablet, Rfl: 0 .  pneumococcal 23 valent vaccine (PNEUMOVAX 23)  25 MCG/0.5ML injection, Inject 0.5 mLs into the muscle once., Disp: 0.5 mL, Rfl: 0 .  predniSONE (DELTASONE) 5 MG tablet, Take 5 mg by mouth daily. , Disp: , Rfl:  .  Pyridoxine HCl (VITAMIN B-6 PO), Take 1 tablet by mouth daily., Disp: , Rfl:  .  ramipril (ALTACE) 5 MG capsule, Take 1 capsule (5 mg total) by mouth daily before breakfast. Office visit due, Disp: 90 capsule, Rfl: 3 .  TIZANIDINE HCL PO, Take by mouth as needed. , Disp: , Rfl:  .  Tofacitinib Citrate (XELJANZ) 5 MG TABS, Take 1 tablet by mouth 2 (two) times daily., Disp: , Rfl:  .  traMADol (ULTRAM) 50 MG tablet, Take 1-2 tablets (50-100 mg total) by mouth every 4 (four) hours. Maximum dose= 8 tablets per day, Disp: 240 tablet, Rfl: 5 .  VOLTAREN 1 % GEL, , Disp: , Rfl: 10  PAST MEDICAL HISTORY: Past  Medical History  Diagnosis Date  . Nonspecific abnormal electrocardiogram (ECG) (EKG)     EKG is normal but reveals early repolarization ST-T changes.  . Rheumatoid arthritis(714.0)     Dr Estanislado Pandy   . OA (osteoarthritis)     Dr Lynann Bologna  . Neuromuscular disorder     Dr. Park Liter- in [place]- [facility] follows M.S.  . Hypertension     stress test many yrs. ago  . GERD (gastroesophageal reflux disease)     +reflux, on occas, no Rx  . Cancer     basal cell ca, in situ- uterine   . PONV (postoperative nausea and vomiting)   . Multiple sclerosis   . Headache(784.0)   . Skin cancer   . Hearing loss   . Movement disorder   . Vision abnormalities     PAST SURGICAL HISTORY: Past Surgical History  Procedure Laterality Date  . Lumbar fusion      Dr Joya Salm  . Appendectomy      with TAH  . Cervical fusion      Dr Joya Salm  . Carpal tunnel release    . Chest tube insertion      for traumatic Pneumothorax  . Colonoscopy w/ polypectomy  1997    negative since; Dr Deatra Ina  . Thyroid surgery      R lobe removed- 1972  . Abdominal hysterectomy      1985  . Nasal sinus surgery    . Total hip arthroplasty  12/22/2011    Procedure: TOTAL HIP ARTHROPLASTY;  Surgeon: Kerin Salen, MD;  Location: [facility];  Service: Orthopedics;  Laterality: Left;  . Cervical fusion  12/31/2012    Dr Joya Salm  . Posterior cervical fusion/foraminotomy N/A 12/31/2012    Procedure: POSTERIOR LATERAL CERVICAL FUSION/FORAMINOTOMY LEVEL 1 CERVICAL THREE-FOUR WITH LATERAL MASS SCREWS;  Surgeon: Floyce Stakes, MD;  Location: [facility];  Service: Neurosurgery;  Laterality: N/A;  . Colonoscopy  07/16/2010    normal   . Esophagogastroduodenoscopy  07/16/2010    normal  . Skin biopsy      FAMILY HISTORY: Family History  Problem Relation Age of Onset  . Cancer Mother     pancreatic  . Diabetes Mother   . Heart disease Father     Rheumatic  . Cancer Father     ? stomach  . Cancer Sister     stomach  . Cancer  Maternal Aunt     X 4; ? primary  . Heart disease Paternal Aunt   . Hypertension Neg Hx   . Stroke  Neg Hx   . Cancer Maternal Grandmother     cervical  . Colon cancer      Aunts  . Multiple sclerosis Daughter     SOCIAL HISTORY:  History   Social History  . Marital Status: Married    Spouse Name: N/A  . Number of Children: N/A  . Years of Education: N/A   Occupational History  . retired    Social History Main Topics  . Smoking status: Former Smoker    Quit date: 05/26/1981  . Smokeless tobacco: Not on file     Comment: Smoked [phone], up to 2.5 ppd  . Alcohol Use: 4.2 oz/week    7 Cans of beer per week     Comment:  2% beer   . Drug Use: No  . Sexual Activity: Not on file   Other Topics Concern  . Not on file   Social History Narrative     PHYSICAL EXAM  Filed Vitals:   10/10/14 1413  BP: 142/72  Pulse: 76  Resp: 12  Height: 5\' 2"  (1.575 m)  Weight: 116 lb 12.8 oz (52.98 kg)    Body mass index is 21.36 kg/(m^2).   General: The patient is well-developed and well-nourished and in no acute distress  Neck: The neck is supple, no carotid bruits are noted.  The neck is nontender.  Musculoskeletal:  Biceps is painful and part of muscle is balled up closer to elbow.   She has reduced ability to flex elbow.  Tender over biceps  Neurologic Exam  Mental status: The patient is alert and oriented x 3 at the time of the examination. The patient has apparent normal recent and remote memory, with an apparently normal attention span and concentration ability.   Speech is normal.  Cranial nerves: Extraocular movements are full.  Facial strength is normal.  Trapezius and sternocleidomastoid strength is normal. No dysarthria is noted.  The tongue is midline, and the patient has symmetric elevation of the soft palate. No obvious hearing deficits are noted.  Motor:   Increased leg muscle tone. Strength is  5 / 5 in all 4 extremities except biceps (due to pain not  maximally tested).   Sensory: Sensory testing is intact to pinprick, soft touch and vibration sensation in all 4 extremities.  Gait and station: Station is normal.   Gait is mildly spastic/wide.   Reflexes: Deep tendon reflexes are symmetric and normal in legs.     DIAGNOSTIC DATA (LABS, IMAGING, TESTING) - I reviewed patient records, labs, notes, testing and imaging myself where available.  Lab Results  Component Value Date   WBC 7.1 12/30/2012   HGB 13.8 12/30/2012   HCT 39.3 12/30/2012   MCV 96.8 12/30/2012   PLT 274 12/30/2012      Component Value Date/Time   NA 135 12/30/2012 1434   K 4.3 12/30/2012 1434   CL 96 12/30/2012 1434   CO2 27 12/30/2012 1434   GLUCOSE 97 12/30/2012 1434   BUN 11 12/30/2012 1434   CREATININE 0.76 12/30/2012 1434   CALCIUM 9.6 12/30/2012 1434   PROT 6.2 11/16/2007 0811   ALBUMIN 3.7 11/16/2007 0811   AST 19 11/16/2007 0811   ALT 19 11/16/2007 0811   ALKPHOS 38* 11/16/2007 0811   BILITOT 0.5 11/16/2007 0811   GFRNONAA 83* 12/30/2012 1434   GFRAA >90 12/30/2012 1434   Lab Results  Component Value Date   CHOL 266* 09/11/2014   HDL 81.20 09/11/2014   LDLCALC 164* 09/11/2014  LDLDIRECT 122.9 11/15/2012   TRIG 105.0 09/11/2014   CHOLHDL 3 09/11/2014   Lab Results  Component Value Date   HGBA1C 5.6 10/24/2013   No results found for: HRCBULAG53 Lab Results  Component Value Date   TSH 3.79 09/11/2014       ASSESSMENT AND PLAN  Biceps tendon tear, right, initial encounter  Multiple sclerosis  Nocturia  Insomnia   1.   Refer to Dr. Adline Peals for biceps tendon tear asap 2.   Continue current med's rtc 4 months, sooner if problems   Dimitrious Micciche A. Felecia Shelling, MD, PhD 6/46/8032, 1:22 PM Certified in Neurology, Clinical Neurophysiology, Sleep Medicine, Pain Medicine and Neuroimaging  [facility] [address] [place], [place] [address] [phone]

## 2014-10-13 ENCOUNTER — Telehealth: Payer: Self-pay | Admitting: Neurology

## 2014-10-13 NOTE — Telephone Encounter (Signed)
PT CALLED AND REQUESTED TO SPEAK WITH FAITH RN REGARDING THE NEED OF A MEDICATION TO HELP HER ANXIETY. PLEASE CALL AND ADVISE.

## 2014-10-16 MED ORDER — DIAZEPAM 5 MG PO TABS: 5.0000 mg | ORAL_TABLET | Freq: Three times a day (TID) | ORAL | Status: DC | PRN

## 2014-10-16 NOTE — Telephone Encounter (Signed)
I have spoken with [name] this morning and per RAS, offered Valium 5mg  po tid prn.  She is agreeable with this plan.  Rx. printed, signed, up front [facility].

## 2014-10-16 NOTE — Telephone Encounter (Signed)
Lets increase her Valium to 5 mg by mouth 3 times a day.

## 2014-10-16 NOTE — Telephone Encounter (Signed)
I have spoken with Sherri [name] this morning.  She sts. she needs additional help with anxiety during the day.  Sts. Buspar does not help, and she feels it causes a ringing in her ears.  Will speak with RAS and call her back today/fim

## 2014-10-17 DIAGNOSIS — Z1231 Encounter for screening mammogram for malignant neoplasm of breast: Secondary | ICD-10-CM | POA: Diagnosis not present

## 2014-10-17 DIAGNOSIS — Z01419 Encounter for gynecological examination (general) (routine) without abnormal findings: Secondary | ICD-10-CM | POA: Diagnosis not present

## 2014-10-17 DIAGNOSIS — Z124 Encounter for screening for malignant neoplasm of cervix: Secondary | ICD-10-CM | POA: Diagnosis not present

## 2014-10-19 LAB — HM MAMMOGRAPHY

## 2014-10-20 ENCOUNTER — Telehealth: Payer: Self-pay | Admitting: Internal Medicine

## 2014-10-20 ENCOUNTER — Emergency Department (HOSPITAL_COMMUNITY)
Admission: EM | Admit: 2014-10-20 | Discharge: 2014-10-20 | Disposition: A | Discharge status: Home / Self Care | Payer: Medicare Other | Attending: Emergency Medicine | Admitting: Emergency Medicine

## 2014-10-20 ENCOUNTER — Encounter (HOSPITAL_COMMUNITY): Payer: Self-pay | Admitting: Emergency Medicine

## 2014-10-20 DIAGNOSIS — Z79899 Other long term (current) drug therapy: Secondary | ICD-10-CM | POA: Diagnosis not present

## 2014-10-20 DIAGNOSIS — Z87891 Personal history of nicotine dependence: Secondary | ICD-10-CM | POA: Insufficient documentation

## 2014-10-20 DIAGNOSIS — L03114 Cellulitis of left upper limb: Secondary | ICD-10-CM | POA: Diagnosis not present

## 2014-10-20 DIAGNOSIS — Z880 Allergy status to penicillin: Secondary | ICD-10-CM | POA: Diagnosis not present

## 2014-10-20 DIAGNOSIS — M069 Rheumatoid arthritis, unspecified: Secondary | ICD-10-CM | POA: Insufficient documentation

## 2014-10-20 DIAGNOSIS — H9190 Unspecified hearing loss, unspecified ear: Secondary | ICD-10-CM | POA: Insufficient documentation

## 2014-10-20 DIAGNOSIS — Z8719 Personal history of other diseases of the digestive system: Secondary | ICD-10-CM | POA: Insufficient documentation

## 2014-10-20 DIAGNOSIS — I10 Essential (primary) hypertension: Secondary | ICD-10-CM | POA: Diagnosis not present

## 2014-10-20 DIAGNOSIS — Z85828 Personal history of other malignant neoplasm of skin: Secondary | ICD-10-CM | POA: Insufficient documentation

## 2014-10-20 LAB — BASIC METABOLIC PANEL WITH GFR
Anion gap: 11 (ref 5–15)
BUN: 8 mg/dL (ref 6–20)
CO2: 27 mmol/L (ref 22–32)
Calcium: 9.3 mg/dL (ref 8.9–10.3)
Chloride: 102 mmol/L (ref 101–111)
Creatinine, Ser: 0.84 mg/dL (ref 0.44–1.00)
GFR calc Af Amer: >60 mL/min
GFR calc non Af Amer: >60 mL/min
Glucose, Bld: 102 mg/dL — ABNORMAL HIGH (ref 65–99)
Potassium: 3.7 mmol/L (ref 3.5–5.1)
Sodium: 140 mmol/L (ref 135–145)

## 2014-10-20 LAB — CBC
HCT: 40.3 % (ref 36.0–46.0)
Hemoglobin: 13.5 g/dL (ref 12.0–15.0)
MCH: 33.9 pg (ref 26.0–34.0)
MCHC: 33.5 g/dL (ref 30.0–36.0)
MCV: 101.3 fL — AB (ref 78.0–100.0)
Platelets: 298 10*3/uL (ref 150–400)
RBC: 3.98 MIL/uL (ref 3.87–5.11)
RDW: 14.4 % (ref 11.5–15.5)
WBC: 6.1 10*3/uL (ref 4.0–10.5)

## 2014-10-20 MED ORDER — DOXYCYCLINE HYCLATE 100 MG PO CAPS: 100.0000 mg | ORAL_CAPSULE | Freq: Two times a day (BID) | ORAL | Status: DC

## 2014-10-20 MED ORDER — BACITRACIN ZINC 500 UNIT/GM EX OINT
TOPICAL_OINTMENT | CUTANEOUS | Status: AC
  Administered 2014-10-20: 1
  Filled 2014-10-20: qty 0.9

## 2014-10-20 MED ORDER — SULFAMETHOXAZOLE-TRIMETHOPRIM 800-160 MG PO TABS
1.0000 | ORAL_TABLET | Freq: Once | ORAL | Status: AC
  Administered 2014-10-20: 1 via ORAL
  Filled 2014-10-20: qty 1

## 2014-10-20 MED ORDER — CLINDAMYCIN PHOSPHATE 600 MG/50ML IV SOLN
600.0000 mg | Freq: Once | INTRAVENOUS | Status: AC
  Administered 2014-10-20: 600 mg via INTRAVENOUS
  Filled 2014-10-20: qty 50

## 2014-10-20 MED ORDER — CLINDAMYCIN HCL 150 MG PO CAPS: 450.0000 mg | ORAL_CAPSULE | Freq: Four times a day (QID) | ORAL | Status: DC

## 2014-10-20 NOTE — Discharge Instructions (Signed)
Return to the ED with any concerns including increased area of redness, fever/chills, vomiting and not able to keep down liquids, decreased level of alertness/lethargy, or any other alarming symptoms

## 2014-10-20 NOTE — Telephone Encounter (Signed)
Patient Name: Sherri [name] DOB: Dec 29, 1940 Initial Comment Caller states she broke up a cat fight, c/o cat scratches that have become infected - red, swollen and streaking Nurse Assessment Nurse: Vallery Sa, RN, Cathy Date/Time (Eastern Time): 10/20/2014 11:14:14 AM Confirm and document reason for call. If symptomatic, describe symptoms. ---Caller states she obtained deep cat scratches to her left arm 3 days ago. The areas are very inflamed. No fever. Has the patient traveled out of the country within the last 30 days? ---No Does the patient require triage? ---Yes Related visit to physician within the last 2 weeks? ---No Does the PT have any chronic conditions? (i.e. diabetes, asthma, etc.) ---Yes List chronic conditions. ---MS, RA Guidelines Guideline Title Affirmed Question Affirmed Notes Animal Bite [1] Cut (length > 1/8 inch or 3 mm) or skin tear AND [2] any animal Final Disposition User Go to ED Now Vallery Sa, RN, [place] She plans to go to [employer] .

## 2014-10-20 NOTE — ED Provider Notes (Signed)
CSN: [id number]     Arrival date & time 10/20/14  1143 History   First MD Initiated Contact with Patient 10/20/14 1220     Chief Complaint  Patient presents with  . Wound      (Consider location/radiation/quality/duration/timing/severity/associated sxs/prior Treatment) HPI  Pt presenting with c/o area of redness around area of cat scratch of right forearm.  Pt states this was her cat who scratched her- no cat bites.  Cat is up to date on rabies shots.  Pt states the scratch occurred 2 days ago, she cleansed the area, treated it with neosporin.  Today she noted that the red area was increasing in size.  She noted some streaking up her arm.  No fever/chills, no generalized weakness or fatigue.  Some pain around of forearm.  She recenlty had an infection due to a cat scratch on her left forearm that was treated successfully with antibiotics, she took doxycycline for this- was prescribed flagyl but could not tolerate the flagyl due to nausea.  There are no other associated systemic symptoms, there are no other alleviating or modifying factors.   Past Medical History  Diagnosis Date  . Nonspecific abnormal electrocardiogram (ECG) (EKG)     EKG is normal but reveals early repolarization ST-T changes.  . Rheumatoid arthritis(714.0)     Dr Estanislado Pandy   . OA (osteoarthritis)     Dr Lynann Bologna  . Neuromuscular disorder     Dr. Park Liter- in [place]- [facility] follows M.S.  . Hypertension     stress test many yrs. ago  . GERD (gastroesophageal reflux disease)     +reflux, on occas, no Rx  . Cancer     basal cell ca, in situ- uterine   . PONV (postoperative nausea and vomiting)   . Multiple sclerosis   . Headache(784.0)   . Skin cancer   . Hearing loss   . Movement disorder   . Vision abnormalities    Past Surgical History  Procedure Laterality Date  . Lumbar fusion      Dr Joya Salm  . Appendectomy      with TAH  . Cervical fusion      Dr Joya Salm  . Carpal tunnel release    . Chest tube  insertion      for traumatic Pneumothorax  . Colonoscopy w/ polypectomy  1997    negative since; Dr Deatra Ina  . Thyroid surgery      R lobe removed- 1972  . Abdominal hysterectomy      1985  . Nasal sinus surgery    . Total hip arthroplasty  12/22/2011    Procedure: TOTAL HIP ARTHROPLASTY;  Surgeon: Kerin Salen, MD;  Location: [facility];  Service: Orthopedics;  Laterality: Left;  . Cervical fusion  12/31/2012    Dr Joya Salm  . Posterior cervical fusion/foraminotomy N/A 12/31/2012    Procedure: POSTERIOR LATERAL CERVICAL FUSION/FORAMINOTOMY LEVEL 1 CERVICAL THREE-FOUR WITH LATERAL MASS SCREWS;  Surgeon: Floyce Stakes, MD;  Location: [facility];  Service: Neurosurgery;  Laterality: N/A;  . Colonoscopy  07/16/2010    normal   . Esophagogastroduodenoscopy  07/16/2010    normal  . Skin biopsy     Family History  Problem Relation Age of Onset  . Cancer Mother     pancreatic  . Diabetes Mother   . Heart disease Father     Rheumatic  . Cancer Father     ? stomach  . Cancer Sister     stomach  .  Cancer Maternal Aunt     X 4; ? primary  . Heart disease Paternal Aunt   . Hypertension Neg Hx   . Stroke Neg Hx   . Cancer Maternal Grandmother     cervical  . Colon cancer      Aunts  . Multiple sclerosis Daughter    History  Substance Use Topics  . Smoking status: Former Smoker    Quit date: 05/26/1981  . Smokeless tobacco: Not on file     Comment: Smoked [id number], up to 2.5 ppd  . Alcohol Use: 4.2 oz/week    7 Cans of beer per week     Comment:  2% beer    OB History    No data available     Review of Systems  ROS reviewed and all otherwise negative except for mentioned in HPI    Allergies  Meperidine hcl; Penicillins; Azathioprine; Darvon; and Ezetimibe-simvastatin  Home Medications   Prior to Admission medications   Medication Sig Start Date End Date Taking? Authorizing Provider  atorvastatin (LIPITOR) 10 MG tablet 1 pill every Weds;fasting labs after 10  weeks Patient taking differently: Take 10 mg by mouth once a week. 1 pill every Weds;fasting labs after 10 weeks 09/19/14  Yes Hendricks Limes, MD  Biotin 5000 MCG TABS Take 5,000 mcg by mouth daily.    Yes Historical Provider, MD  Calcium Carbonate-Vitamin D (CALCIUM + D PO) Take 1 tablet by mouth daily. 1200 mg daily   Yes Historical Provider, MD  Cyanocobalamin (VITAMIN B-12 PO) Take 1 tablet by mouth daily.   Yes Historical Provider, MD  denosumab (PROLIA) 60 MG/ML SOLN injection Inject 60 mg into the skin every 6 (six) months. Administer in upper arm, thigh, or abdomen   Yes Historical Provider, MD  diazepam (VALIUM) 5 MG tablet Take 1 tablet (5 mg total) by mouth 3 (three) times daily as needed for anxiety. 10/16/14  Yes Britt Bottom, MD  folic acid (FOLVITE) 1 MG tablet Take 1 mg by mouth 2 (two) times daily.   Yes Historical Provider, MD  furosemide (LASIX) 20 MG tablet TAKE 1 TABLET BY MOUTH EVERY DAY Patient taking differently: TAKE 20 MG BY MOUTH EVERY DAY 09/01/14  Yes Hendricks Limes, MD  methotrexate (RHEUMATREX) 2.5 MG tablet Take 10 mg by mouth once a week.  05/10/14  Yes Historical Provider, MD  Omega-3 Fatty Acids (OMEGA 3 PO) Take 1,000 mg by mouth daily.    Yes Historical Provider, MD  Oxycodone HCl 10 MG TABS Take 1 tablet (10 mg total) by mouth 2 (two) times daily as needed. Patient taking differently: Take 10 mg by mouth 2 (two) times daily as needed (pain).  08/15/14  Yes Britt Bottom, MD  predniSONE (DELTASONE) 5 MG tablet Take 5 mg by mouth daily.    Yes Historical Provider, MD  Pyridoxine HCl (VITAMIN B-6 PO) Take 1 tablet by mouth daily.   Yes Historical Provider, MD  ramipril (ALTACE) 5 MG capsule Take 1 capsule (5 mg total) by mouth daily before breakfast. Office visit due 11/21/13  Yes Hendricks Limes, MD  tiZANidine (ZANAFLEX) 4 MG capsule Take 4 mg by mouth 3 (three) times daily as needed for muscle spasms.   Yes Historical Provider, MD  Tofacitinib Citrate  (XELJANZ) 5 MG TABS Take 5 mg by mouth 2 (two) times daily.    Yes Historical Provider, MD  traMADol (ULTRAM) 50 MG tablet Take 1-2 tablets (50-100 mg total) by mouth  every 4 (four) hours. Maximum dose= 8 tablets per day 08/15/14  Yes Britt Bottom, MD  VOLTAREN 1 % GEL Apply 2 g topically 2 (two) times daily as needed (pain).  07/12/14  Yes Historical Provider, MD  clindamycin (CLEOCIN) 150 MG capsule Take 3 capsules (450 mg total) by mouth every 6 (six) hours. 10/20/14   Alfonzo Beers, MD  doxycycline (VIBRAMYCIN) 100 MG capsule Take 1 capsule (100 mg total) by mouth 2 (two) times daily. 10/20/14   Alfonzo Beers, MD  metroNIDAZOLE (FLAGYL) 500 MG tablet Take 1 tablet (500 mg total) by mouth 3 (three) times daily. Patient not taking: Reported on 10/20/2014 09/26/14   Hendricks Limes, MD  pneumococcal 23 valent vaccine (PNEUMOVAX 23) 25 MCG/0.5ML injection Inject 0.5 mLs into the muscle once. Patient not taking: Reported on 10/20/2014 12/27/12   Hendricks Limes, MD   BP 135/67 mmHg  Pulse 85  Temp(Src) 97.8 F (36.6 C) (Oral)  Resp 16  SpO2 100%  Vitals reviewed Physical Exam  Physical Examination: General appearance - alert, well appearing, and in no distress Mental status - alert, oriented to person, place, and time Eyes - no conjunctival injection, no scleral icterus Chest - clear to auscultation, no wheezes, rales or rhonchi, symmetric air entry Heart - normal rate, regular rhythm, normal S1, S2, no murmurs, rubs, clicks or gallops Neurological - alert, oriented x 3, sensation intact distally, strength intact in fingers and hands Musculoskeletal - no joint tenderness, deformity or swelling Extremities - peripheral pulses normal, no pedal edema, no clubbing or cyanosis Skin - normal coloration and turgor,erythema of forearm with abrasions, no fluctuance or induration,   ED Course  Procedures (including critical care time) Labs Review Labs Reviewed  CBC - Abnormal; Notable for the  following:    MCV 101.3 (*)    All other components within normal limits  BASIC METABOLIC PANEL - Abnormal; Notable for the following:    Glucose, Bld 102 (*)    All other components within normal limits  CULTURE, BLOOD (ROUTINE X 2)  CULTURE, BLOOD (ROUTINE X 2)    Imaging Review No results found.   EKG Interpretation None      MDM   Final diagnoses:  Cellulitis of left upper extremity    Pt presenting with cellulitis of right forearm after cat scratch 2 days ago.  Pt given IV dose of abx in the ED.  Will treat with doxycyline and clindamycin- as she cannot tolerate the flagyl.  Pt advised to have patient rechecked in the next 2 days.  Discharged with strict return precautions.  Pt agreeable with plan.   Alfonzo Beers, MD 10/22/14 [phone]

## 2014-10-20 NOTE — ED Notes (Addendum)
Pt reports previous bite from cat; treated and antibiotics complete as of end of April. Recent cat scratch to right wrist on Tuesday; now redness and striking present to arm. Pt reports rabies up to date.

## 2014-10-22 ENCOUNTER — Ambulatory Visit
Admission: RE | Admit: 2014-10-22 | Discharge: 2014-10-22 | Disposition: A | Discharge status: Home / Self Care | Payer: Medicare Other | Source: Ambulatory Visit | Attending: Orthopedic Surgery | Admitting: Orthopedic Surgery

## 2014-10-22 DIAGNOSIS — S4991XA Unspecified injury of right shoulder and upper arm, initial encounter: Secondary | ICD-10-CM | POA: Diagnosis not present

## 2014-10-22 DIAGNOSIS — M25511 Pain in right shoulder: Secondary | ICD-10-CM

## 2014-10-24 ENCOUNTER — Telehealth: Payer: Self-pay | Admitting: Neurology

## 2014-10-24 ENCOUNTER — Ambulatory Visit: Payer: Medicare Other | Admitting: Internal Medicine

## 2014-10-24 MED ORDER — OXYCODONE HCL 10 MG PO TABS: 10.0000 mg | ORAL_TABLET | Freq: Two times a day (BID) | ORAL | Status: DC | PRN

## 2014-10-24 NOTE — Telephone Encounter (Signed)
I have spoken with [name] this morning and advised rx. will be ready this am.  Rx. printed, signed, up front [facility]/fim

## 2014-10-24 NOTE — Telephone Encounter (Signed)
Patient called requesting a refill for Oxycodone HCl 10 MG TABS. Patient was advised that the script will be ready for pick up in 24 hrs unless she hears otherwise by the nurse. She requested if there was any way she could get her refill today since she is completely out. Patient can be reached @ [phone]

## 2014-10-25 ENCOUNTER — Telehealth: Payer: Self-pay | Admitting: *Deleted

## 2014-10-25 NOTE — Telephone Encounter (Signed)
[facility] - Client [facility] Patient Name: Sherri [name] Gender: Female DOB: July 15, 1940 Age: 74 Y 52 M 27 D Return Phone Number: [phone] (Primary), [phone] (Secondary) Address: City/State/Zip: [place] Client [facility] - Client Client Site [facility] - Day Physician [place] Type Call Call Type Triage / Clinical Relationship To Patient Self Appointment Disposition EMR Appointment Not Necessary Info pasted into Epic Yes Return Phone Number [phone] (Primary) Chief Complaint Animal Bite Initial Comment Caller states she broke up a cat fight, c/o cat scratches that have become infected - red, swollen and streaking PreDisposition Call Doctor Nurse Assessment Nurse: Vallery Sa, RN, [place] Date/Time (Eastern Time): 10/20/2014 11:14:14 AM Confirm and document reason for call. If symptomatic, describe symptoms. ---Caller states she obtained deep cat scratches to her left arm 3 days ago. The areas are very inflamed. No fever. Has the patient traveled out of the country within the last 30 days? ---No Does the patient require triage? ---Yes Related visit to physician within the last 2 weeks? ---No Does the PT have any chronic conditions? (i.e. diabetes, asthma, etc.) ---Yes List chronic conditions. ---MS, RA Guidelines Guideline Title Affirmed Question Affirmed Notes Nurse Date/Time (Eastern Time) Animal Bite [1] Cut (length > 1/8 inch or 3 mm) or skin tear AND [2] any animal [place], RN, Cathy 10/20/2014 11:16:06 AM Disp. Time Eilene Ghazi Time) Disposition Final User 10/20/2014 11:06:11 AM Send To Clinical Follow Up Rich Brave, Amy 10/20/2014 11:18:33 AM Go to ED Now Yes Vallery Sa, RN, Rosey Bath Understands: Yes Disagree/Comply: Comply PLEASE NOTE: All timestamps contained within this report are represented as [place] Standard Time. CONFIDENTIALTY NOTICE:  This fax transmission is intended only for the addressee. It contains information that is legally privileged, confidential or otherwise protected from use or disclosure. If you are not the intended recipient, you are strictly prohibited from reviewing, disclosing, copying using or disseminating any of this information or taking any action in reliance on or regarding this information. If you have received this fax in error, please notify us immediately by telephone so that we can arrange for its return to [place]. Phone: [phone], Toll-Free: [phone], Fax: [phone] Page: 2 of 2 Call Id: [id number] Care Advice Given Per Guideline GO TO ED NOW: You need to be seen in the Emergency Department. Go to the ER at ___________ [place] now. Drive carefully. ALTERNATE DISPOSITION - URGENT CARE CENTER: An Urgent [facility] can usually manage this problem, IF one is available in the caller's area. DRESSING: Cover with a sterile gauze or clean cloth until seen. CARE ADVICE given per Animal Bite (Adult) guideline. After Care Instructions Given Call Event Type User Date / Time Description Referrals [facility] - ED [facility] - ED

## 2014-10-26 LAB — CULTURE, BLOOD (ROUTINE X 2)
CULTURE: NO GROWTH
Culture: NO GROWTH

## 2014-10-30 DIAGNOSIS — M7551 Bursitis of right shoulder: Secondary | ICD-10-CM | POA: Diagnosis not present

## 2014-11-02 ENCOUNTER — Telehealth: Payer: Self-pay | Admitting: *Deleted

## 2014-11-02 DIAGNOSIS — L570 Actinic keratosis: Secondary | ICD-10-CM | POA: Diagnosis not present

## 2014-11-02 DIAGNOSIS — D0470 Carcinoma in situ of skin of unspecified lower limb, including hip: Secondary | ICD-10-CM | POA: Diagnosis not present

## 2014-11-02 DIAGNOSIS — D0472 Carcinoma in situ of skin of left lower limb, including hip: Secondary | ICD-10-CM | POA: Diagnosis not present

## 2014-11-02 NOTE — Telephone Encounter (Signed)
I called pt about her mammogram. She states she just had her mammogram on 10/17/14 at [facility]. Will call for results. Received mammogram results. Results abstracted. Sent report to be scanned.

## 2014-11-07 ENCOUNTER — Other Ambulatory Visit: Payer: Self-pay | Admitting: Internal Medicine

## 2014-11-07 DIAGNOSIS — E785 Hyperlipidemia, unspecified: Secondary | ICD-10-CM | POA: Diagnosis not present

## 2014-11-07 DIAGNOSIS — Z79899 Other long term (current) drug therapy: Secondary | ICD-10-CM | POA: Diagnosis not present

## 2014-11-07 DIAGNOSIS — M059 Rheumatoid arthritis with rheumatoid factor, unspecified: Secondary | ICD-10-CM | POA: Diagnosis not present

## 2014-11-07 DIAGNOSIS — S46111A Strain of muscle, fascia and tendon of long head of biceps, right arm, initial encounter: Secondary | ICD-10-CM | POA: Diagnosis not present

## 2014-11-10 ENCOUNTER — Telehealth: Payer: Self-pay | Admitting: Internal Medicine

## 2014-11-10 ENCOUNTER — Other Ambulatory Visit: Payer: Self-pay | Admitting: Internal Medicine

## 2014-11-10 DIAGNOSIS — E785 Hyperlipidemia, unspecified: Secondary | ICD-10-CM

## 2014-11-10 NOTE — Telephone Encounter (Signed)
Please advise 

## 2014-11-10 NOTE — Telephone Encounter (Signed)
Pt called in and said that [facility] wanted her to have fasting labs on the 23rd?  Can you put those orders in?

## 2014-11-10 NOTE — Telephone Encounter (Signed)
Pt called and said that [facility] wanted her to have fasting labs on the 23rd?  Can you put those orders in ?

## 2014-11-10 NOTE — Telephone Encounter (Signed)
Orders in 

## 2014-11-14 ENCOUNTER — Encounter: Payer: Self-pay | Admitting: Neurology

## 2014-11-14 ENCOUNTER — Ambulatory Visit (INDEPENDENT_AMBULATORY_CARE_PROVIDER_SITE_OTHER): Payer: Medicare Other | Admitting: Neurology

## 2014-11-14 VITALS — BP 114/58 | HR 70 | Resp 14 | Ht 62.0 in | Wt 112.4 lb

## 2014-11-14 DIAGNOSIS — R202 Paresthesia of skin: Secondary | ICD-10-CM | POA: Insufficient documentation

## 2014-11-14 DIAGNOSIS — G4700 Insomnia, unspecified: Secondary | ICD-10-CM

## 2014-11-14 DIAGNOSIS — R269 Unspecified abnormalities of gait and mobility: Secondary | ICD-10-CM

## 2014-11-14 DIAGNOSIS — G35 Multiple sclerosis: Secondary | ICD-10-CM | POA: Diagnosis not present

## 2014-11-14 DIAGNOSIS — R200 Anesthesia of skin: Secondary | ICD-10-CM | POA: Diagnosis not present

## 2014-11-14 DIAGNOSIS — R5382 Chronic fatigue, unspecified: Secondary | ICD-10-CM | POA: Diagnosis not present

## 2014-11-14 DIAGNOSIS — M069 Rheumatoid arthritis, unspecified: Secondary | ICD-10-CM | POA: Diagnosis not present

## 2014-11-14 DIAGNOSIS — F418 Other specified anxiety disorders: Secondary | ICD-10-CM

## 2014-11-14 DIAGNOSIS — S46111D Strain of muscle, fascia and tendon of long head of biceps, right arm, subsequent encounter: Secondary | ICD-10-CM

## 2014-11-14 MED ORDER — OXYCODONE HCL 10 MG PO TABS: 10.0000 mg | ORAL_TABLET | Freq: Two times a day (BID) | ORAL | Status: DC | PRN

## 2014-11-14 MED ORDER — DIAZEPAM 5 MG PO TABS: 5.0000 mg | ORAL_TABLET | Freq: Three times a day (TID) | ORAL | Status: DC | PRN

## 2014-11-14 NOTE — Progress Notes (Signed)
[facility]  PATIENT: Sherri [name] DOB: 1940/08/08  REFERRING CLINICIAN: Unice Cobble  HISTORY FROM: patient REASON FOR VISIT: Multiple sclerosis   HISTORICAL  CHIEF COMPLAINT:  Chief Complaint  Patient presents with  . Multiple Sclerosis    Sts. she still has left arm pain due to torn bicep. Sts. ortho (Dr. Adline Peals) told her there was nothing she could do for this--Diara is going to see [place] for 2nd opinion.  She is currently taking Oxycodone only at hs for pain, and sts. that with taking  hs Valium help  her sleep well.  Sts. gait is about the same.  She remains active--tries to walk a mile daily.  No recent falls.  She is ambulatory with steady gait, not dragging either foot today/fim    HISTORY OF PRESENT ILLNESS:  Sherri [name] is a 74 year old woman who was diagnosed with relapsing remitting MS in 2010.     Gait/strength:    She drags right foot at times and feels off balanced at times but has not had any falls.    She has left facial numbness but no limb numbness.      Sensation:   She feels the facial numbness is better than earlier this year.   The facial numbness involves the entire left side of the face in V2 and V3 > V1.    Bladder:   She has urinary frequency for many years. U/S shows she does not completely empty.   It is not been bad enough for any medication.   She has had UTI's in the past.     Vision:   No MS related vision problems.     Fatigue/sleep:     Fatigue is ok many days as she is sleeping better with less stress  Taking  Valium 5 mg and Oxycodone 10 mg at bedtime has helped her.   Pain was affecting her sleep   She is active, walking daily.    Mood/cognition:        She notes mild depression with family and financial issues.  This is better since grand-daughter has moved out.   She does not note significant cognitive changes.  MS History:   When she was in her late 67s she had an episode of right-sided numbness and  she lost the use of her right arm for a couple of weeks. At that time, she was told that she might have MS. Over the next 30 or 40 years she had several more similar but milder episodes. In 2010, she had an MRI of the cervical spine (for arm pain) and it showed foci c/w multiple sclerosis. An MRI of the brain was also performed  shortly thereafter showing white matter lesions consistent with the diagnosis.  Initially, she was placed on Avonex. She also has rheumatoid arthritis and was placed on methotrexate and then a combination of methotrexate with [name]. We decided to stop the Avonex as she probably would get some benefit from those medications for the MS as well. For the most part her MS has been stable over the past 5 years.However, early 2016,  she had numbness in the left face and weakness in her legs   REVIEW OF SYSTEMS:  Constitutional: No fevers, chills, sweats, or change in appetite Eyes: No visual changes, double vision, eye pain Ear, nose and throat: No hearing loss, ear pain, nasal congestion, sore throat Cardiovascular: No chest pain, palpitations Respiratory:  No shortness of breath at rest or with  exertion.   No wheezes GastrointestinaI: No nausea, vomiting, diarrhea, abdominal pain, fecal incontinence Genitourinary:  as above. Musculoskeletal:  No neck pain, back pain Integumentary: No rash, pruritus, skin lesions Neurological: as above Psychiatric: he has had a lot of family stress leading to some depression and more anxiety. Endocrine: No palpitations, diaphoresis, change in appetite, change in weigh or increased thirst Hematologic/Lymphatic:  No anemia, purpura, petechiae. Allergic/Immunologic: No itchy/runny eyes, nasal congestion, recent allergic reactions, rashes  ALLERGIES: Allergies  Allergen Reactions  . Meperidine Hcl Shortness Of Breath  . Penicillins Hives  . Azathioprine Other (See Comments)    Weakness  . Darvon [Propoxyphene Hcl]     Lowered respirations  greatly  . Ezetimibe-Simvastatin Nausea And Vomiting    HOME MEDICATIONS: Outpatient Prescriptions Prior to Visit  Medication Sig Dispense Refill  . atorvastatin (LIPITOR) 10 MG tablet 1 pill every Weds;fasting labs after 10 weeks (Patient taking differently: Take 10 mg by mouth once a week. 1 pill every Weds;fasting labs after 10 weeks) 30 tablet 0  . Biotin 5000 MCG TABS Take 5,000 mcg by mouth daily.     . Calcium Carbonate-Vitamin D (CALCIUM + D PO) Take 1 tablet by mouth daily. 1200 mg daily    . Cyanocobalamin (VITAMIN B-12 PO) Take 1 tablet by mouth daily.    [name] denosumab (PROLIA) 60 MG/ML SOLN injection Inject 60 mg into the skin every 6 (six) months. Administer in upper arm, thigh, or abdomen    . diazepam (VALIUM) 5 MG tablet Take 1 tablet (5 mg total) by mouth 3 (three) times daily as needed for anxiety. 90 tablet 3  . folic acid (FOLVITE) 1 MG tablet Take 1 mg by mouth 2 (two) times daily.    . furosemide (LASIX) 20 MG tablet TAKE 1 TABLET BY MOUTH EVERY DAY (Patient taking differently: TAKE 20 MG BY MOUTH EVERY DAY) 90 tablet 1  . methotrexate (RHEUMATREX) 2.5 MG tablet Take 10 mg by mouth once a week.   2  . Omega-3 Fatty Acids (OMEGA 3 PO) Take 1,000 mg by mouth daily.     . Oxycodone HCl 10 MG TABS Take 1 tablet (10 mg total) by mouth 2 (two) times daily as needed. 60 tablet 0  . pneumococcal 23 valent vaccine (PNEUMOVAX 23) 25 MCG/0.5ML injection Inject 0.5 mLs into the muscle once. 0.5 mL 0  . predniSONE (DELTASONE) 5 MG tablet Take 5 mg by mouth daily.     . Pyridoxine HCl (VITAMIN B-6 PO) Take 1 tablet by mouth daily.    . ramipril (ALTACE) 5 MG capsule Take 1 capsule (5 mg total) by mouth daily with breakfast. 90 capsule 3  . tiZANidine (ZANAFLEX) 4 MG capsule Take 4 mg by mouth 3 (three) times daily as needed for muscle spasms.    . Tofacitinib Citrate (XELJANZ) 5 MG TABS Take 5 mg by mouth 2 (two) times daily.     . traMADol (ULTRAM) 50 MG tablet Take 1-2 tablets (50-100  mg total) by mouth every 4 (four) hours. Maximum dose= 8 tablets per day 240 tablet 5  . VOLTAREN 1 % GEL Apply 2 g topically 2 (two) times daily as needed (pain).   10  . clindamycin (CLEOCIN) 150 MG capsule Take 3 capsules (450 mg total) by mouth every 6 (six) hours. (Patient not taking: Reported on 11/14/2014) 84 capsule 0  . doxycycline (VIBRAMYCIN) 100 MG capsule Take 1 capsule (100 mg total) by mouth 2 (two) times daily. (Patient not taking: Reported  on 11/14/2014) 20 capsule 0  . metroNIDAZOLE (FLAGYL) 500 MG tablet Take 1 tablet (500 mg total) by mouth 3 (three) times daily. (Patient not taking: Reported on 10/20/2014) 21 tablet 0   No facility-administered medications prior to visit.    PAST MEDICAL HISTORY: Past Medical History  Diagnosis Date  . Nonspecific abnormal electrocardiogram (ECG) (EKG)     EKG is normal but reveals early repolarization ST-T changes.  . Rheumatoid arthritis(714.0)     Dr Estanislado Pandy   . OA (osteoarthritis)     Dr Lynann Bologna  . Neuromuscular disorder     Dr. Park Liter- in [place]- [facility] follows M.S.  . Hypertension     stress test many yrs. ago  . GERD (gastroesophageal reflux disease)     +reflux, on occas, no Rx  . Cancer     basal cell ca, in situ- uterine   . PONV (postoperative nausea and vomiting)   . Multiple sclerosis   . Headache(784.0)   . Skin cancer   . Hearing loss   . Movement disorder   . Vision abnormalities     PAST SURGICAL HISTORY: Past Surgical History  Procedure Laterality Date  . Lumbar fusion      Dr Joya Salm  . Appendectomy      with TAH  . Cervical fusion      Dr Joya Salm  . Carpal tunnel release    . Chest tube insertion      for traumatic Pneumothorax  . Colonoscopy w/ polypectomy  1997    negative since; Dr Deatra Ina  . Thyroid surgery      R lobe removed- 1972  . Abdominal hysterectomy      1985  . Nasal sinus surgery    . Total hip arthroplasty  12/22/2011    Procedure: TOTAL HIP ARTHROPLASTY;  Surgeon: Kerin Salen, MD;  Location: [facility];  Service: Orthopedics;  Laterality: Left;  . Cervical fusion  12/31/2012    Dr Joya Salm  . Posterior cervical fusion/foraminotomy N/A 12/31/2012    Procedure: POSTERIOR LATERAL CERVICAL FUSION/FORAMINOTOMY LEVEL 1 CERVICAL THREE-FOUR WITH LATERAL MASS SCREWS;  Surgeon: Floyce Stakes, MD;  Location: [facility];  Service: Neurosurgery;  Laterality: N/A;  . Colonoscopy  07/16/2010    normal   . Esophagogastroduodenoscopy  07/16/2010    normal  . Skin biopsy      FAMILY HISTORY: Family History  Problem Relation Age of Onset  . Cancer Mother     pancreatic  . Diabetes Mother   . Heart disease Father     Rheumatic  . Cancer Father     ? stomach  . Cancer Sister     stomach  . Cancer Maternal Aunt     X 4; ? primary  . Heart disease Paternal Aunt   . Hypertension Neg Hx   . Stroke Neg Hx   . Cancer Maternal Grandmother     cervical  . Colon cancer      Aunts  . Multiple sclerosis Daughter     SOCIAL HISTORY:  History   Social History  . Marital Status: Married    Spouse Name: N/A  . Number of Children: N/A  . Years of Education: N/A   Occupational History  . retired    Social History Main Topics  . Smoking status: Former Smoker    Quit date: 05/26/1981  . Smokeless tobacco: Not on file     Comment: Smoked [phone], up to 2.5 ppd  . Alcohol Use:  4.2 oz/week    7 Cans of beer per week     Comment:  2% beer   . Drug Use: No  . Sexual Activity: Not on file   Other Topics Concern  . Not on file   Social History Narrative     PHYSICAL EXAM  Filed Vitals:   11/14/14 1430  BP: 114/58  Pulse: 70  Resp: 14  Height: 5\' 2"  (1.575 m)  Weight: 112 lb 6.4 oz (50.984 kg)    Body mass index is 20.55 kg/(m^2).   General: The patient is well-developed and well-nourished and in no acute distress  Musculoskeletal:   Right biceps bulge from ruptured tendon.    Skin: Extremities are without significant edema.  Neurologic  Exam  Mental status: The patient is alert and oriented x 3 at the time of the examination. The patient has apparent normal recent and remote memory, with an apparently normal attention span and concentration ability.   Speech is normal.  CN:  EOMI, reduced facial sensation to soft touch left V2 more than V3 .Facial strength is normal.  Trapezius and sternocleidomastoid strength is normal. No dysarthria is noted.   No obvious hearing deficits are noted.  Motor:  Muscle bulk is normal and tone is mildly increased in her legs, left > right. Strength is  5 / 5 in all 4 extremities.   (did not fully test right arm due to biceps injury)  Sensory: Sensory testing is intact to touch in all 4 extremities.  Coordination: Cerebellar testing reveals good rapid alternating movements in handsy.  Gait and station: her station is stable with the eyes open and mild wobbling with eyes closed. Gait was mildly wide. She has wide tandem walk.  Romberg is negative.   Reflexes: Deep tendon reflexes are symmetric and normal in legs.    DIAGNOSTIC DATA (LABS, IMAGING, TESTING) - I reviewed patient records, labs, notes, testing and imaging myself where available.  Lab Results  Component Value Date   WBC 6.1 10/20/2014   HGB 13.5 10/20/2014   HCT 40.3 10/20/2014   MCV 101.3* 10/20/2014   PLT 298 10/20/2014      Component Value Date/Time   NA 140 10/20/2014 1300   K 3.7 10/20/2014 1300   CL 102 10/20/2014 1300   CO2 27 10/20/2014 1300   GLUCOSE 102* 10/20/2014 1300   BUN 8 10/20/2014 1300   CREATININE 0.84 10/20/2014 1300   CALCIUM 9.3 10/20/2014 1300   PROT 6.2 11/16/2007 0811   ALBUMIN 3.7 11/16/2007 0811   AST 19 11/16/2007 0811   ALT 19 11/16/2007 0811   ALKPHOS 38* 11/16/2007 0811   BILITOT 0.5 11/16/2007 0811   GFRNONAA >60 10/20/2014 1300   GFRAA >60 10/20/2014 1300   Lab Results  Component Value Date   CHOL 266* 09/11/2014   HDL 81.20 09/11/2014   LDLCALC 164* 09/11/2014   LDLDIRECT  122.9 11/15/2012   TRIG 105.0 09/11/2014   CHOLHDL 3 09/11/2014   Lab Results  Component Value Date   HGBA1C 5.6 10/24/2013   No results found for: VITAMINB12 Lab Results  Component Value Date   TSH 3.79 09/11/2014        ASSESSMENT AND PLAN  Multiple sclerosis  Rheumatoid arthritis  Biceps tendon tear, right, subsequent encounter  Chronic fatigue  Gait disturbance  Depression with anxiety  Insomnia  Numbness   1.   She will continue off of a MS disease modifying therapy as she is on [place] and methotrexate for  RA and methotrexate likely has some benefit in MS. However, if she has another possible relapse or if she does not do well with her RA, rituximab could be a good choice with efficacy for both diseases. 2.   Mood is doing better and she will continue off of the BuSpar but will continue on diazepam at bedtime (and she can take if anxiety worsens during the day) she will also continue the oxycodone once a day to help her sleep 3.   Continue other medications. 4.   I concur with her getting a second opinion on the biceps tendon. Although she is 36, she has always been fairly active and she would consider surgery if it would increase her likelihood of her right arm more.  Return to see [place] in 4 months or sooner if she has new or worsening neurologic symptoms.    Richard A. Felecia Shelling, MD, PhD 9/45/8592, 9:24 PM Certified in Neurology, Clinical Neurophysiology, Sleep Medicine, Pain Medicine and Neuroimaging  [facility] [address], [place], [place] [address] [phone]

## 2014-11-16 ENCOUNTER — Ambulatory Visit (INDEPENDENT_AMBULATORY_CARE_PROVIDER_SITE_OTHER): Payer: Medicare Other | Admitting: Internal Medicine

## 2014-11-16 ENCOUNTER — Encounter: Payer: Self-pay | Admitting: Internal Medicine

## 2014-11-16 ENCOUNTER — Other Ambulatory Visit (INDEPENDENT_AMBULATORY_CARE_PROVIDER_SITE_OTHER): Payer: Medicare Other

## 2014-11-16 VITALS — BP 120/72 | HR 97 | Temp 98.2°F | Resp 16 | Wt 111.0 lb

## 2014-11-16 DIAGNOSIS — S46111D Strain of muscle, fascia and tendon of long head of biceps, right arm, subsequent encounter: Secondary | ICD-10-CM | POA: Diagnosis not present

## 2014-11-16 DIAGNOSIS — E785 Hyperlipidemia, unspecified: Secondary | ICD-10-CM

## 2014-11-16 LAB — LIPID PANEL
CHOLESTEROL: 249 mg/dL — AB (ref 0–200)
HDL: 89.4 mg/dL (ref >39.00)
LDL CALC: 137 mg/dL — AB (ref 0–99)
NonHDL: 159.6
Total CHOL/HDL Ratio: 3
Triglycerides: 111 mg/dL (ref 0.0–149.0)
VLDL: 22.2 mg/dL (ref 0.0–40.0)

## 2014-11-16 LAB — CK: Total CK: 53 U/L (ref 7–177)

## 2014-11-16 LAB — HEPATIC FUNCTION PANEL
ALBUMIN: 4.2 g/dL (ref 3.5–5.2)
ALK PHOS: 32 U/L — AB (ref 39–117)
ALT: 17 U/L (ref 0–35)
AST: 21 U/L (ref 0–37)
Bilirubin, Direct: 0.1 mg/dL (ref 0.0–0.3)
Total Bilirubin: 0.6 mg/dL (ref 0.2–1.2)
Total Protein: 6.8 g/dL (ref 6.0–8.3)

## 2014-11-16 NOTE — Progress Notes (Signed)
   Subjective:    Patient ID: Sherri [name], female    DOB: 02-17-1941, 74 y.o.   MRN: [id number]  HPI She is here in follow-up of her lipids. Fasting lipids, hepatic function, and CK were drawn this morning. She remains on a heart healthy diet. She is unable to exercise due to a biceps tear. She is limited to walking. She denies any active cardiopulmonary symptoms.  On 10/07/14 she was baking when she felt acute pain in the right upper extremity. MRI of the right shoulder 10/22/14 revealed complete tear of the long head of the biceps just distal to its attachment to the superior labrum with retraction of approximately 7 cm below the top of the humeral head. She was seen by an Orthopedist & told there was nothing that could be done for this.  She continues to have a constant dull aching pain up to level V. With any use of the right upper trembly the pain is an 8. The pain is in the area of the biceps, upper humerus, right anterior chest, and scapula. She is severely limited. She cannot lift even a dinner plate. She has difficulty combing her hair or driving.   Review of Systems  Chest pain, palpitations, tachycardia, exertional dyspnea, paroxysmal nocturnal dyspnea, claudication or edema are absent.     Objective:   Physical Exam Pertinent or positive findings include: There is asymmetry of the biceps; the right is smaller than the left. She has decreased range of motion and decreased strength in the right upper extremity.  General appearance :Thin but adequately nourished; in no distress.  Eyes: No conjunctival inflammation or scleral icterus is present.  Heart:  Normal rate and regular rhythm. S1 and S2 normal without gallop, murmur, click, rub or other extra sounds    Lungs:Chest clear to auscultation; no wheezes, rhonchi,rales ,or rubs present.No increased work of breathing.    Vascular : all pulses equal ; no bruits present.  Skin:Warm & dry.  Intact without suspicious lesions  or rashes ; no tenting or jaundice   Lymphatic: No lymphadenopathy is noted about the head, neck, axilla        Assessment & Plan:  #1 dyslipidemia, definitive plans pending return of labs done today  #2 biceps rupture with profound limitation of even activities of daily living.  Plan: See orders and recommendation

## 2014-11-16 NOTE — Progress Notes (Signed)
Pre visit review using our clinic review tool, if applicable. No additional management support is needed unless otherwise documented below in the visit note. 

## 2014-11-16 NOTE — Patient Instructions (Signed)
The Orthopedic referral will be scheduled and you'll be notified of the time.Please call the Referral Co-Ordinator @ [phone] if you have not been notified of appointment time within 7-10 days.

## 2014-11-17 ENCOUNTER — Encounter: Payer: Self-pay | Admitting: Internal Medicine

## 2014-11-19 ENCOUNTER — Other Ambulatory Visit: Payer: Self-pay | Admitting: Internal Medicine

## 2014-11-19 DIAGNOSIS — E785 Hyperlipidemia, unspecified: Secondary | ICD-10-CM

## 2014-11-23 NOTE — Telephone Encounter (Signed)
Please advise if you would like me to send in a RX for pt.

## 2014-12-04 ENCOUNTER — Other Ambulatory Visit: Payer: Self-pay | Admitting: Emergency Medicine

## 2014-12-04 DIAGNOSIS — Z79899 Other long term (current) drug therapy: Secondary | ICD-10-CM | POA: Diagnosis not present

## 2014-12-04 MED ORDER — CICLOPIROX 8 % EX SOLN: Freq: Every day | CUTANEOUS | Status: DC

## 2014-12-06 NOTE — Telephone Encounter (Signed)
Please advise if pt should do anything else other than [place]

## 2014-12-12 ENCOUNTER — Encounter: Payer: Self-pay | Admitting: Internal Medicine

## 2014-12-12 DIAGNOSIS — S4991XA Unspecified injury of right shoulder and upper arm, initial encounter: Secondary | ICD-10-CM | POA: Diagnosis not present

## 2014-12-12 DIAGNOSIS — S43431A Superior glenoid labrum lesion of right shoulder, initial encounter: Secondary | ICD-10-CM | POA: Diagnosis not present

## 2014-12-12 DIAGNOSIS — M19011 Primary osteoarthritis, right shoulder: Secondary | ICD-10-CM | POA: Diagnosis not present

## 2014-12-15 DIAGNOSIS — L570 Actinic keratosis: Secondary | ICD-10-CM | POA: Diagnosis not present

## 2014-12-15 DIAGNOSIS — D0462 Carcinoma in situ of skin of left upper limb, including shoulder: Secondary | ICD-10-CM | POA: Diagnosis not present

## 2014-12-15 DIAGNOSIS — D485 Neoplasm of uncertain behavior of skin: Secondary | ICD-10-CM | POA: Diagnosis not present

## 2014-12-15 DIAGNOSIS — D0460 Carcinoma in situ of skin of unspecified upper limb, including shoulder: Secondary | ICD-10-CM | POA: Diagnosis not present

## 2014-12-15 DIAGNOSIS — D045 Carcinoma in situ of skin of trunk: Secondary | ICD-10-CM | POA: Diagnosis not present

## 2014-12-15 DIAGNOSIS — L905 Scar conditions and fibrosis of skin: Secondary | ICD-10-CM | POA: Diagnosis not present

## 2014-12-18 ENCOUNTER — Telehealth: Payer: Self-pay | Admitting: Neurology

## 2014-12-18 NOTE — Telephone Encounter (Signed)
Pt called and would like a call back about her surgery on her shoulder, possible medication change. Please call and advise [phone]

## 2014-12-18 NOTE — Telephone Encounter (Signed)
I have spoken with [name] this morning.  She is scheduled for right shoulder surgery on 01-01-15--sts. ortho has told her she will have sig. p/o pain.  She has a rx. for Oxycodone 10mg  bid prn, but she generally only takes this at hs.  She expresses a strong fear of addiction and doesn't want to take more med unless absolutely necessary.  I have advised if she needs more med. than rx'd, she should discuss with RAS, or have ortho call him to discuss, that she should not accept a rx. from other docs without RAS' approval.  She verbalized understanding of same/fim

## 2014-12-23 ENCOUNTER — Other Ambulatory Visit: Payer: Self-pay | Admitting: Internal Medicine

## 2014-12-26 ENCOUNTER — Other Ambulatory Visit: Payer: Self-pay

## 2014-12-26 DIAGNOSIS — E785 Hyperlipidemia, unspecified: Secondary | ICD-10-CM

## 2014-12-26 MED ORDER — ATORVASTATIN CALCIUM 10 MG PO TABS: ORAL_TABLET | ORAL | Status: DC

## 2014-12-28 NOTE — H&P (Signed)
Sherri [name] is an 74 y.o. female.    Chief Complaint: right shoulder pain  HPI: Pt is a 74 y.o. female complaining of right shoulder pain for multiple months. Pain had continually increased since the beginning. X-rays in the clinic show [facility] arthrosis. Pt has tried various conservative treatments which have failed to alleviate their symptoms, including injections and therapy. Various options are discussed with the patient. Risks, benefits and expectations were discussed with the patient. Patient understand the risks, benefits and expectations and wishes to proceed with surgery.   PCP:  Unice Cobble, MD  D/C Plans: Home  PMH: Past Medical History  Diagnosis Date  . Nonspecific abnormal electrocardiogram (ECG) (EKG)     EKG is normal but reveals early repolarization ST-T changes.  . Rheumatoid arthritis(714.0)     Dr Estanislado Pandy   . OA (osteoarthritis)     Dr Lynann Bologna  . Neuromuscular disorder     Dr. Park Liter- in [place]- [facility] follows M.S.  . Hypertension     stress test many yrs. ago  . GERD (gastroesophageal reflux disease)     +reflux, on occas, no Rx  . Cancer     basal cell ca, in situ- uterine   . PONV (postoperative nausea and vomiting)   . Multiple sclerosis   . Headache(784.0)   . Skin cancer   . Hearing loss   . Movement disorder   . Vision abnormalities     PSH: Past Surgical History  Procedure Laterality Date  . Lumbar fusion      Dr Joya Salm  . Appendectomy      with TAH  . Cervical fusion      Dr Joya Salm  . Carpal tunnel release    . Chest tube insertion      for traumatic Pneumothorax  . Colonoscopy w/ polypectomy  1997    negative since; Dr Deatra Ina  . Thyroid surgery      R lobe removed- 1972  . Abdominal hysterectomy      1985  . Nasal sinus surgery    . Total hip arthroplasty  12/22/2011    Procedure: TOTAL HIP ARTHROPLASTY;  Surgeon: Kerin Salen, MD;  Location: [facility];  Service: Orthopedics;  Laterality: Left;  . Cervical fusion  12/31/2012     Dr Joya Salm  . Posterior cervical fusion/foraminotomy N/A 12/31/2012    Procedure: POSTERIOR LATERAL CERVICAL FUSION/FORAMINOTOMY LEVEL 1 CERVICAL THREE-FOUR WITH LATERAL MASS SCREWS;  Surgeon: Floyce Stakes, MD;  Location: [facility];  Service: Neurosurgery;  Laterality: N/A;  . Colonoscopy  07/16/2010    normal   . Esophagogastroduodenoscopy  07/16/2010    normal  . Skin biopsy      Social History:  reports that she quit smoking about 33 years ago. She does not have any smokeless tobacco history on file. She reports that she drinks about 4.2 oz of alcohol per week. She reports that she does not use illicit drugs.  Allergies:  Allergies  Allergen Reactions  . Meperidine Hcl Shortness Of Breath  . Penicillins Hives  . Azathioprine Other (See Comments)    Weakness  . Darvon [Propoxyphene Hcl]     Lowered respirations greatly  . Ezetimibe-Simvastatin Nausea And Vomiting    Medications: No current facility-administered medications for this encounter.   Current Outpatient Prescriptions  Medication Sig Dispense Refill  . atorvastatin (LIPITOR) 10 MG tablet 1 pill every Monday and Fri 30 tablet 1  . Biotin 5000 MCG TABS Take 5,000 mcg by mouth  daily.     . Calcium Carbonate-Vitamin D (CALCIUM + D PO) Take 1 tablet by mouth daily. 1200 mg daily    . ciclopirox (PENLAC) 8 % solution Apply topically at bedtime. Apply over nail and surrounding skin. Apply daily over previous coat. After seven (7) days, may remove with alcohol and continue cycle. 6.6 mL 0  . Cyanocobalamin (VITAMIN B-12 PO) Take 1 tablet by mouth daily.    [name] denosumab (PROLIA) 60 MG/ML SOLN injection Inject 60 mg into the skin every 6 (six) months. Administer in upper arm, thigh, or abdomen    . diazepam (VALIUM) 5 MG tablet Take 1 tablet (5 mg total) by mouth 3 (three) times daily as needed for anxiety. 90 tablet 3  . folic acid (FOLVITE) 1 MG tablet Take 1 mg by mouth 2 (two) times daily.    . furosemide (LASIX) 20 MG  tablet TAKE 1 TABLET BY MOUTH EVERY DAY (Patient taking differently: TAKE 20 MG BY MOUTH EVERY DAY) 90 tablet 1  . methotrexate (RHEUMATREX) 2.5 MG tablet Take 10 mg by mouth once a week.   2  . Omega-3 Fatty Acids (OMEGA 3 PO) Take 1,000 mg by mouth daily.     . Oxycodone HCl 10 MG TABS Take 1 tablet (10 mg total) by mouth 2 (two) times daily as needed. 60 tablet 0  . pneumococcal 23 valent vaccine (PNEUMOVAX 23) 25 MCG/0.5ML injection Inject 0.5 mLs into the muscle once. 0.5 mL 0  . predniSONE (DELTASONE) 5 MG tablet Take 5 mg by mouth daily.     . Pyridoxine HCl (VITAMIN B-6 PO) Take 1 tablet by mouth daily.    . ramipril (ALTACE) 5 MG capsule Take 1 capsule (5 mg total) by mouth daily with breakfast. 90 capsule 3  . tiZANidine (ZANAFLEX) 4 MG capsule Take 4 mg by mouth 3 (three) times daily as needed for muscle spasms.    . Tofacitinib Citrate (XELJANZ) 5 MG TABS Take 5 mg by mouth 2 (two) times daily.     . traMADol (ULTRAM) 50 MG tablet Take 1-2 tablets (50-100 mg total) by mouth every 4 (four) hours. Maximum dose= 8 tablets per day 240 tablet 5  . VOLTAREN 1 % GEL Apply 2 g topically 2 (two) times daily as needed (pain).   10    No results found for this or any previous visit (from the past 48 hour(s)). No results found.  ROS: Pain with rom of the right upper extremity  Physical Exam:  Alert and oriented 74 y.o. female in no acute distress Cranial nerves 2-12 intact Cervical spine: full rom with no tenderness, nv intact distally Chest: active breath sounds bilaterally, no wheeze rhonchi or rales Heart: regular rate and rhythm, no murmur Abd: non tender non distended with active bowel sounds Hip is stable with rom  Right shoulder with moderate AC joint tenderness nv intact distally No rashes or edema Strength of ER and IR 5/5 bilaterally  Assessment/Plan Assessment: right shoulder pain with AC arthrosis  Plan: Patient will undergo a right shoulder scope and open DCR by Dr.  Veverly Fells at [facility]. Risks benefits and expectations were discussed with the patient. Patient understand risks, benefits and expectations and wishes to proceed.

## 2015-01-01 ENCOUNTER — Encounter: Payer: Self-pay | Admitting: Internal Medicine

## 2015-01-04 ENCOUNTER — Encounter (HOSPITAL_COMMUNITY)
Admission: RE | Admit: 2015-01-04 | Discharge: 2015-01-04 | Disposition: A | Discharge status: Home / Self Care | Payer: Medicare Other | Source: Ambulatory Visit | Attending: Orthopedic Surgery | Admitting: Orthopedic Surgery

## 2015-01-04 ENCOUNTER — Encounter: Payer: Self-pay | Admitting: Internal Medicine

## 2015-01-04 ENCOUNTER — Other Ambulatory Visit: Payer: Self-pay

## 2015-01-04 ENCOUNTER — Encounter (HOSPITAL_COMMUNITY): Payer: Self-pay

## 2015-01-04 ENCOUNTER — Other Ambulatory Visit: Payer: Self-pay | Admitting: Internal Medicine

## 2015-01-04 DIAGNOSIS — Z01810 Encounter for preprocedural cardiovascular examination: Secondary | ICD-10-CM | POA: Insufficient documentation

## 2015-01-04 DIAGNOSIS — M19011 Primary osteoarthritis, right shoulder: Secondary | ICD-10-CM | POA: Diagnosis not present

## 2015-01-04 DIAGNOSIS — Z01812 Encounter for preprocedural laboratory examination: Secondary | ICD-10-CM | POA: Insufficient documentation

## 2015-01-04 HISTORY — DX: Effusion, unspecified joint: M25.40

## 2015-01-04 HISTORY — DX: Spontaneous ecchymoses: R23.3

## 2015-01-04 HISTORY — DX: Unspecified cataract: H26.9

## 2015-01-04 HISTORY — DX: Pain in unspecified joint: M25.50

## 2015-01-04 HISTORY — DX: Other chronic pain: G89.29

## 2015-01-04 HISTORY — DX: Personal history of other diseases of the respiratory system: Z87.09

## 2015-01-04 HISTORY — DX: Nocturia: R35.1

## 2015-01-04 HISTORY — DX: Dizziness and giddiness: R42

## 2015-01-04 HISTORY — DX: Personal history of colonic polyps: Z86.010

## 2015-01-04 HISTORY — DX: Hyperlipidemia, unspecified: E78.5

## 2015-01-04 HISTORY — DX: Retention of urine, unspecified: R33.9

## 2015-01-04 HISTORY — DX: Personal history of colon polyps, unspecified: Z86.0100

## 2015-01-04 HISTORY — DX: Dorsalgia, unspecified: M54.9

## 2015-01-04 HISTORY — DX: Other skin changes: R23.8

## 2015-01-04 HISTORY — DX: Anxiety disorder, unspecified: F41.9

## 2015-01-04 LAB — BASIC METABOLIC PANEL
ANION GAP: 11 (ref 5–15)
BUN: 11 mg/dL (ref 6–20)
CO2: 25 mmol/L (ref 22–32)
CREATININE: 0.87 mg/dL (ref 0.44–1.00)
Calcium: 9.4 mg/dL (ref 8.9–10.3)
Chloride: 101 mmol/L (ref 101–111)
Glucose, Bld: 91 mg/dL (ref 65–99)
Potassium: 4 mmol/L (ref 3.5–5.1)
SODIUM: 137 mmol/L (ref 135–145)

## 2015-01-04 LAB — CBC
HCT: 42.3 % (ref 36.0–46.0)
Hemoglobin: 14.4 g/dL (ref 12.0–15.0)
MCH: 34.2 pg — ABNORMAL HIGH (ref 26.0–34.0)
MCHC: 34 g/dL (ref 30.0–36.0)
MCV: 100.5 fL — ABNORMAL HIGH (ref 78.0–100.0)
PLATELETS: 340 10*3/uL (ref 150–400)
RBC: 4.21 MIL/uL (ref 3.87–5.11)
RDW: 14.3 % (ref 11.5–15.5)
WBC: 9 10*3/uL (ref 4.0–10.5)

## 2015-01-04 NOTE — Progress Notes (Addendum)
Medical Md is Dr.William Community education officer denies having one  Echo denies ever having one  Stress test >27yrs ago  Heart cath denies ever having one  EKG denies having one in past yr  CXR denies having one in past yr

## 2015-01-04 NOTE — Pre-Procedure Instructions (Signed)
Sherri [name]  01/04/2015      [id number] [address], [address] [place] [address] Phone: [phone] Fax: [phone]    Your procedure is scheduled on Fri, Aug 19 @ 1:00 PM  Report to [facility] Admitting at 11:00 AM.  Call this number if you have problems the morning of surgery:  [phone]   Remember:  Do not eat food or drink liquids after midnight.  Take these medicines the morning of surgery with A SIP OF WATER Valium(Diazepam),Pain Pill(if needed),and Prednisone(Deltasone)               Stop taking your Fish Oil,Vitamins,and any Herbal Medications. No Goody's,BC's,Aleve,Aspirin,and Ibuprofen     Do not wear jewelry, make-up or nail polish.  Do not wear lotions, powders, or perfumes.  You may wear deodorant.  Do not shave 48 hours prior to surgery.    Do not bring valuables to the hospital.  [facility] is not responsible for any belongings or valuables.  Contacts, dentures or bridgework may not be worn into surgery.  Leave your suitcase in the car.  After surgery it may be brought to your room.  For patients admitted to the hospital, discharge time will be determined by your treatment team.  Patients discharged the day of surgery will not be allowed to drive home.    Special instructions:  [facility] - Preparing for Surgery  Before surgery, you can play an important role.  Because skin is not sterile, your skin needs to be as free of germs as possible.  You can reduce the number of germs on you skin by washing with CHG (chlorahexidine gluconate) soap before surgery.  CHG is an antiseptic cleaner which kills germs and bonds with the skin to continue killing germs even after washing.  Please DO NOT use if you have an allergy to CHG or antibacterial soaps.  If your skin becomes reddened/irritated stop using the CHG and inform your nurse when you arrive at Short Stay.  Do not shave (including legs and  underarms) for at least 48 hours prior to the first CHG shower.  You may shave your face.  Please follow these instructions carefully:   1.  Shower with CHG Soap the night before surgery and the                                morning of Surgery.  2.  If you choose to wash your hair, wash your hair first as usual with your       normal shampoo.  3.  After you shampoo, rinse your hair and body thoroughly to remove the                      Shampoo.  4.  Use CHG as you would any other liquid soap.  You can apply chg directly       to the skin and wash gently with scrungie or a clean washcloth.  5.  Apply the CHG Soap to your body ONLY FROM THE NECK DOWN.        Do not use on open wounds or open sores.  Avoid contact with your eyes,       ears, mouth and genitals (private parts).  Wash genitals (private parts)       with your normal soap.  6.  Wash thoroughly,  paying special attention to the area where your surgery        will be performed.  7.  Thoroughly rinse your body with warm water from the neck down.  8.  DO NOT shower/wash with your normal soap after using and rinsing off       the CHG Soap.  9.  Pat yourself dry with a clean towel.            10.  Wear clean pajamas.            11.  Place clean sheets on your bed the night of your first shower and do not        sleep with pets.  Day of Surgery  Do not apply any lotions/deoderants the morning of surgery.  Please wear clean clothes to the hospital/surgery center.    Please read over the following fact sheets that you were given. Pain Booklet, Coughing and Deep Breathing and Surgical Site Infection Prevention

## 2015-01-04 NOTE — Telephone Encounter (Signed)
Pt call back requesting status on my-chart msg...Sherri [name]

## 2015-01-04 NOTE — Telephone Encounter (Signed)
Please advise 

## 2015-01-11 MED ORDER — CLINDAMYCIN PHOSPHATE 900 MG/50ML IV SOLN
900.0000 mg | INTRAVENOUS | Status: AC
  Administered 2015-01-12: 900 mg via INTRAVENOUS
  Filled 2015-01-11: qty 50

## 2015-01-11 MED ORDER — CHLORHEXIDINE GLUCONATE 4 % EX LIQD: 60.0000 mL | Freq: Once | CUTANEOUS | Status: DC

## 2015-01-12 ENCOUNTER — Ambulatory Visit (HOSPITAL_COMMUNITY)
Admission: RE | Admit: 2015-01-12 | Discharge: 2015-01-12 | Disposition: A | Discharge status: Home / Self Care | Payer: Medicare Other | Source: Ambulatory Visit | Attending: Orthopedic Surgery | Admitting: Orthopedic Surgery

## 2015-01-12 ENCOUNTER — Encounter (HOSPITAL_COMMUNITY): Payer: Self-pay | Admitting: *Deleted

## 2015-01-12 ENCOUNTER — Ambulatory Visit (HOSPITAL_COMMUNITY): Payer: Medicare Other | Admitting: Anesthesiology

## 2015-01-12 ENCOUNTER — Encounter (HOSPITAL_COMMUNITY)
Admission: RE | Disposition: A | Discharge status: Home / Self Care | Payer: Self-pay | Source: Ambulatory Visit | Attending: Orthopedic Surgery

## 2015-01-12 DIAGNOSIS — Z87891 Personal history of nicotine dependence: Secondary | ICD-10-CM | POA: Diagnosis not present

## 2015-01-12 DIAGNOSIS — M25511 Pain in right shoulder: Secondary | ICD-10-CM | POA: Insufficient documentation

## 2015-01-12 DIAGNOSIS — Z888 Allergy status to other drugs, medicaments and biological substances status: Secondary | ICD-10-CM | POA: Diagnosis not present

## 2015-01-12 DIAGNOSIS — G35 Multiple sclerosis: Secondary | ICD-10-CM | POA: Diagnosis not present

## 2015-01-12 DIAGNOSIS — S43431A Superior glenoid labrum lesion of right shoulder, initial encounter: Secondary | ICD-10-CM | POA: Diagnosis not present

## 2015-01-12 DIAGNOSIS — Z85828 Personal history of other malignant neoplasm of skin: Secondary | ICD-10-CM | POA: Insufficient documentation

## 2015-01-12 DIAGNOSIS — E785 Hyperlipidemia, unspecified: Secondary | ICD-10-CM | POA: Diagnosis not present

## 2015-01-12 DIAGNOSIS — Z880 Allergy status to penicillin: Secondary | ICD-10-CM | POA: Insufficient documentation

## 2015-01-12 DIAGNOSIS — G8929 Other chronic pain: Secondary | ICD-10-CM | POA: Diagnosis not present

## 2015-01-12 DIAGNOSIS — F419 Anxiety disorder, unspecified: Secondary | ICD-10-CM | POA: Insufficient documentation

## 2015-01-12 DIAGNOSIS — Z79899 Other long term (current) drug therapy: Secondary | ICD-10-CM | POA: Insufficient documentation

## 2015-01-12 DIAGNOSIS — M549 Dorsalgia, unspecified: Secondary | ICD-10-CM | POA: Diagnosis not present

## 2015-01-12 DIAGNOSIS — M19011 Primary osteoarthritis, right shoulder: Secondary | ICD-10-CM | POA: Insufficient documentation

## 2015-01-12 DIAGNOSIS — G8918 Other acute postprocedural pain: Secondary | ICD-10-CM | POA: Diagnosis not present

## 2015-01-12 DIAGNOSIS — X58XXXA Exposure to other specified factors, initial encounter: Secondary | ICD-10-CM | POA: Diagnosis not present

## 2015-01-12 DIAGNOSIS — K219 Gastro-esophageal reflux disease without esophagitis: Secondary | ICD-10-CM | POA: Diagnosis not present

## 2015-01-12 DIAGNOSIS — I10 Essential (primary) hypertension: Secondary | ICD-10-CM | POA: Diagnosis not present

## 2015-01-12 DIAGNOSIS — Z86010 Personal history of colonic polyps: Secondary | ICD-10-CM | POA: Diagnosis not present

## 2015-01-12 DIAGNOSIS — M069 Rheumatoid arthritis, unspecified: Secondary | ICD-10-CM | POA: Diagnosis not present

## 2015-01-12 DIAGNOSIS — Z8542 Personal history of malignant neoplasm of other parts of uterus: Secondary | ICD-10-CM | POA: Diagnosis not present

## 2015-01-12 SURGERY — SHOULDER ARTHROSCOPY WITH SUBACROMIAL DECOMPRESSION AND DISTAL CLAVICLE EXCISION
Anesthesia: Regional | Site: Shoulder | Laterality: Right

## 2015-01-12 MED ORDER — ONDANSETRON HCL 4 MG/2ML IJ SOLN
INTRAMUSCULAR | Status: AC
  Filled 2015-01-12: qty 2

## 2015-01-12 MED ORDER — PHENYLEPHRINE HCL 10 MG/ML IJ SOLN
INTRAMUSCULAR | Status: DC | PRN
  Administered 2015-01-12: 40 ug via INTRAVENOUS

## 2015-01-12 MED ORDER — GLYCOPYRROLATE 0.2 MG/ML IJ SOLN
INTRAMUSCULAR | Status: DC | PRN
  Administered 2015-01-12: 0.6 mg via INTRAVENOUS

## 2015-01-12 MED ORDER — MIDAZOLAM HCL 2 MG/2ML IJ SOLN
INTRAMUSCULAR | Status: AC
  Administered 2015-01-12: 1 mg
  Filled 2015-01-12: qty 2

## 2015-01-12 MED ORDER — HYDROMORPHONE HCL 1 MG/ML IJ SOLN
INTRAMUSCULAR | Status: AC
  Filled 2015-01-12: qty 1

## 2015-01-12 MED ORDER — FENTANYL CITRATE (PF) 100 MCG/2ML IJ SOLN
INTRAMUSCULAR | Status: DC | PRN
  Administered 2015-01-12: 100 ug via INTRAVENOUS

## 2015-01-12 MED ORDER — LIDOCAINE HCL (CARDIAC) 20 MG/ML IV SOLN
INTRAVENOUS | Status: AC
  Filled 2015-01-12: qty 5

## 2015-01-12 MED ORDER — PHENYLEPHRINE HCL 10 MG/ML IJ SOLN
INTRAMUSCULAR | Status: AC
  Filled 2015-01-12: qty 1

## 2015-01-12 MED ORDER — LACTATED RINGERS IV SOLN
INTRAVENOUS | Status: DC
  Administered 2015-01-12 (×3): via INTRAVENOUS

## 2015-01-12 MED ORDER — HYDROMORPHONE HCL 1 MG/ML IJ SOLN
0.2500 mg | INTRAMUSCULAR | Status: DC | PRN
  Administered 2015-01-12 (×2): 0.5 mg via INTRAVENOUS

## 2015-01-12 MED ORDER — NEOSTIGMINE METHYLSULFATE 10 MG/10ML IV SOLN
INTRAVENOUS | Status: DC | PRN
  Administered 2015-01-12: 4 mg via INTRAVENOUS

## 2015-01-12 MED ORDER — BUPIVACAINE-EPINEPHRINE 0.25% -1:200000 IJ SOLN
INTRAMUSCULAR | Status: DC | PRN
  Administered 2015-01-12: 5 mL

## 2015-01-12 MED ORDER — SODIUM CHLORIDE 0.9 % IR SOLN
Status: DC | PRN
  Administered 2015-01-12: 6000 mL

## 2015-01-12 MED ORDER — NEOSTIGMINE METHYLSULFATE 10 MG/10ML IV SOLN
INTRAVENOUS | Status: AC
  Filled 2015-01-12: qty 1

## 2015-01-12 MED ORDER — BUPIVACAINE-EPINEPHRINE (PF) 0.25% -1:200000 IJ SOLN
INTRAMUSCULAR | Status: AC
  Filled 2015-01-12: qty 30

## 2015-01-12 MED ORDER — FENTANYL CITRATE (PF) 100 MCG/2ML IJ SOLN
INTRAMUSCULAR | Status: AC
  Administered 2015-01-12: 50 ug
  Filled 2015-01-12: qty 2

## 2015-01-12 MED ORDER — ROCURONIUM BROMIDE 100 MG/10ML IV SOLN
INTRAVENOUS | Status: DC | PRN
  Administered 2015-01-12: 25 mg via INTRAVENOUS

## 2015-01-12 MED ORDER — GLYCOPYRROLATE 0.2 MG/ML IJ SOLN
INTRAMUSCULAR | Status: AC
  Filled 2015-01-12: qty 3

## 2015-01-12 MED ORDER — FENTANYL CITRATE (PF) 250 MCG/5ML IJ SOLN
INTRAMUSCULAR | Status: AC
  Filled 2015-01-12: qty 5

## 2015-01-12 MED ORDER — DEXTROSE 5 % IV SOLN
10.0000 mg | INTRAVENOUS | Status: DC | PRN
  Administered 2015-01-12: 10 ug/min via INTRAVENOUS

## 2015-01-12 MED ORDER — OXYCODONE-ACETAMINOPHEN 10-325 MG PO TABS: 1.0000 | ORAL_TABLET | ORAL | Status: DC | PRN

## 2015-01-12 MED ORDER — PROPOFOL 10 MG/ML IV BOLUS
INTRAVENOUS | Status: DC | PRN
  Administered 2015-01-12: 140 mg via INTRAVENOUS

## 2015-01-12 MED ORDER — LIDOCAINE HCL (CARDIAC) 20 MG/ML IV SOLN
INTRAVENOUS | Status: DC | PRN
Start: 2015-01-12 — End: 2015-01-12
  Administered 2015-01-12: 50 mg via INTRAVENOUS

## 2015-01-12 MED ORDER — PROMETHAZINE HCL 25 MG/ML IJ SOLN: 6.2500 mg | INTRAMUSCULAR | Status: DC | PRN

## 2015-01-12 MED ORDER — TIZANIDINE HCL 4 MG PO TABS: 4.0000 mg | ORAL_TABLET | Freq: Four times a day (QID) | ORAL | Status: DC | PRN

## 2015-01-12 MED ORDER — PROPOFOL 10 MG/ML IV BOLUS
INTRAVENOUS | Status: AC
  Filled 2015-01-12: qty 20

## 2015-01-12 MED ORDER — BUPIVACAINE-EPINEPHRINE (PF) 0.5% -1:200000 IJ SOLN
INTRAMUSCULAR | Status: DC | PRN
  Administered 2015-01-12: 20 mL via PERINEURAL

## 2015-01-12 SURGICAL SUPPLY — 70 items
BLADE LONG MED 31MMX9MM (MISCELLANEOUS)
BLADE LONG MED 31X9 (MISCELLANEOUS) IMPLANT
BLADE SURG 11 STRL SS (BLADE) ×3 IMPLANT
BUR OVAL 4.0 (BURR) IMPLANT
CLOSURE STERI-STRIP 1/2X4 (GAUZE/BANDAGES/DRESSINGS) ×1
CLOSURE WOUND 1/2 X4 (GAUZE/BANDAGES/DRESSINGS) ×1
CLSR STERI-STRIP ANTIMIC 1/2X4 (GAUZE/BANDAGES/DRESSINGS) ×1 IMPLANT
COVER SURGICAL LIGHT HANDLE (MISCELLANEOUS) ×3 IMPLANT
DRAPE INCISE IOBAN 66X45 STRL (DRAPES) ×3 IMPLANT
DRAPE STERI 35X30 U-POUCH (DRAPES) ×3 IMPLANT
DRAPE U-SHAPE 47X51 STRL (DRAPES) ×3 IMPLANT
DRILL BIT 5/64 (BIT) ×3 IMPLANT
DRSG EMULSION OIL 3X3 NADH (GAUZE/BANDAGES/DRESSINGS) ×4 IMPLANT
DRSG PAD ABDOMINAL 8X10 ST (GAUZE/BANDAGES/DRESSINGS) ×4 IMPLANT
DURAPREP 26ML APPLICATOR (WOUND CARE) ×3 IMPLANT
ELECT NDL TIP 2.8 STRL (NEEDLE) ×1 IMPLANT
ELECT NEEDLE TIP 2.8 STRL (NEEDLE) ×3 IMPLANT
ELECT REM PT RETURN 9FT ADLT (ELECTROSURGICAL)
ELECTRODE REM PT RTRN 9FT ADLT (ELECTROSURGICAL) IMPLANT
GAUZE SPONGE 4X4 12PLY STRL (GAUZE/BANDAGES/DRESSINGS) ×3 IMPLANT
GLOVE BIOGEL PI ORTHO PRO 7.5 (GLOVE) ×2
GLOVE BIOGEL PI ORTHO PRO SZ8 (GLOVE) ×2
GLOVE ORTHO TXT STRL SZ7.5 (GLOVE) ×3 IMPLANT
GLOVE PI ORTHO PRO STRL 7.5 (GLOVE) ×1 IMPLANT
GLOVE PI ORTHO PRO STRL SZ8 (GLOVE) ×1 IMPLANT
GLOVE SURG ORTHO 8.5 STRL (GLOVE) ×3 IMPLANT
GOWN STRL REUS W/ TWL LRG LVL3 (GOWN DISPOSABLE) ×2 IMPLANT
GOWN STRL REUS W/ TWL XL LVL3 (GOWN DISPOSABLE) ×2 IMPLANT
GOWN STRL REUS W/TWL LRG LVL3 (GOWN DISPOSABLE) ×6
GOWN STRL REUS W/TWL XL LVL3 (GOWN DISPOSABLE) ×6
KIT BASIN OR (CUSTOM PROCEDURE TRAY) ×3 IMPLANT
KIT JUGGERKNOT DISP 2.9MM (KITS) IMPLANT
KIT ROOM TURNOVER OR (KITS) ×3 IMPLANT
MANIFOLD NEPTUNE II (INSTRUMENTS) ×3 IMPLANT
NDL HYPO 25GX1X1/2 BEV (NEEDLE) ×1 IMPLANT
NDL SPNL 18GX3.5 QUINCKE PK (NEEDLE) ×1 IMPLANT
NDL SUT 6 .5 CRC .975X.05 MAYO (NEEDLE) IMPLANT
NEEDLE HYPO 25GX1X1/2 BEV (NEEDLE) ×3 IMPLANT
NEEDLE MAYO TAPER (NEEDLE)
NEEDLE SPNL 18GX3.5 QUINCKE PK (NEEDLE) ×3 IMPLANT
NS IRRIG 1000ML POUR BTL (IV SOLUTION) ×3 IMPLANT
PACK SHOULDER (CUSTOM PROCEDURE TRAY) ×3 IMPLANT
PAD ARMBOARD 7.5X6 YLW CONV (MISCELLANEOUS) ×6 IMPLANT
RESECTOR FULL RADIUS 4.2MM (BLADE) IMPLANT
SET ARTHROSCOPY TUBING (MISCELLANEOUS) ×3
SET ARTHROSCOPY TUBING LN (MISCELLANEOUS) ×1 IMPLANT
SLING ARM LRG ADULT FOAM STRAP (SOFTGOODS) ×3 IMPLANT
SLING ARM MED ADULT FOAM STRAP (SOFTGOODS) IMPLANT
SPONGE GAUZE 4X4 12PLY STER LF (GAUZE/BANDAGES/DRESSINGS) ×2 IMPLANT
SPONGE LAP 4X18 X RAY DECT (DISPOSABLE) ×3 IMPLANT
STRIP CLOSURE SKIN 1/2X4 (GAUZE/BANDAGES/DRESSINGS) ×2 IMPLANT
SUCTION FRAZIER TIP 10 FR DISP (SUCTIONS) ×3 IMPLANT
SUT BONE WAX W31G (SUTURE) IMPLANT
SUT FIBERWIRE #2 38 T-5 BLUE (SUTURE)
SUT MNCRL AB 4-0 PS2 18 (SUTURE) ×3 IMPLANT
SUT VIC AB 0 CT1 27 (SUTURE)
SUT VIC AB 0 CT1 27XBRD ANBCTR (SUTURE) IMPLANT
SUT VIC AB 0 CT2 27 (SUTURE) IMPLANT
SUT VIC AB 2-0 CT1 27 (SUTURE)
SUT VIC AB 2-0 CT1 TAPERPNT 27 (SUTURE) IMPLANT
SUT VICRYL 0 CT 1 36IN (SUTURE) ×9 IMPLANT
SUTURE FIBERWR #2 38 T-5 BLUE (SUTURE) IMPLANT
SYR CONTROL 10ML LL (SYRINGE) ×3 IMPLANT
TAPE CLOTH SURG 6X10 WHT LF (GAUZE/BANDAGES/DRESSINGS) ×2 IMPLANT
TOWEL OR 17X24 6PK STRL BLUE (TOWEL DISPOSABLE) ×3 IMPLANT
TOWEL OR 17X26 10 PK STRL BLUE (TOWEL DISPOSABLE) ×3 IMPLANT
TUBE CONNECTING 12'X1/4 (SUCTIONS) ×1
TUBE CONNECTING 12X1/4 (SUCTIONS) ×2 IMPLANT
WAND HAND CNTRL MULTIVAC 90 (MISCELLANEOUS) ×3 IMPLANT
WATER STERILE IRR 1000ML POUR (IV SOLUTION) ×3 IMPLANT

## 2015-01-12 NOTE — Discharge Instructions (Signed)
Ice to the shoulder as much as possible.  Ok to alternate with moist heat  Keep the incision covered and clean and dry for 5 days, then ok to shower and get the wounds wet.  Do not change the bandages until Monday, then remove gauze and apply BandAIds.  Do exercises every hour while awake -  Pendulums, Lap Slides, Rotation(door hinge) exercises, assisted lifts  Ok to remove the sling in the home and "hug a pillow." Use the sling out of the house.  Follow up in two weeks in the office  [phone]

## 2015-01-12 NOTE — Transfer of Care (Signed)
Immediate Anesthesia Transfer of Care Note  Patient: [place]  Procedure(s) Performed: Procedure(s): RIGHT SHOULDER ARTHROSCOPY WITH ANTERIOR SUBACROMIAL DECOMPRESSION AND OPEN DISTAL CLAVICLE RESECTION (Right)  Patient Location: PACU  Anesthesia Type:GA combined with regional for post-op pain  Level of Consciousness: awake, alert , oriented, patient cooperative and responds to stimulation  Airway & Oxygen Therapy: Patient Spontanous Breathing and Patient connected to nasal cannula oxygen  Post-op Assessment: Report given to RN, Post -op Vital signs reviewed and stable and Patient able to stick tongue midline  Post vital signs: stable  Last Vitals:  Filed Vitals:   01/12/15 1240  BP: 129/60  Pulse: 99  Temp:   Resp: 14    Complications: No apparent anesthesia complications

## 2015-01-12 NOTE — Brief Op Note (Signed)
01/12/2015  3:05 PM  PATIENT:  [place]  74 y.o. 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, Retained Biceps Stump  PROCEDURE:  Procedure(s): RIGHT SHOULDER ARTHROSCOPY WITH ANTERIOR SUBACROMIAL DECOMPRESSION AND OPEN DISTAL CLAVICLE RESECTION (Right) extensive debridt of biceps stump  SURGEON:  Surgeon(s) and Role:    * Netta Cedars, MD - Primary  PHYSICIAN ASSISTANT:   ASSISTANTS: Ventura Bruns, PA-C   ANESTHESIA:   regional and general  EBL:  Total I/O In: 1300 [I.V.:1300] Out: -   BLOOD ADMINISTERED:none  DRAINS: none   LOCAL MEDICATIONS USED:  MARCAINE     SPECIMEN:  No Specimen  DISPOSITION OF SPECIMEN:  N/A  COUNTS:  YES  TOURNIQUET:  * No tourniquets in log *  DICTATION: .Other Dictation: Dictation Number [id number]  PLAN OF CARE: Discharge to home after PACU  PATIENT DISPOSITION:  PACU - hemodynamically stable.   Delay start of Pharmacological VTE agent (>24hrs) due to surgical blood loss or risk of bleeding: no

## 2015-01-12 NOTE — Interval H&P Note (Signed)
History and Physical Interval Note:  01/12/2015 12:38 PM  Sherri [name]  has presented today for surgery, with the diagnosis of right shoulder [facility] Degenrative Joint Disease and SLAP Tear  The various methods of treatment have been discussed with the patient and family. After consideration of risks, benefits and other options for treatment, the patient has consented to  Procedure(s): RIGHT SHOULDER ARTHROSCOPY WITH ANTERIOR SUBACROMIAL DECOMPRESSION AND OPEN DISTAL CLAVICLE RESECTION (Right) as a surgical intervention .  The patient's history has been reviewed, patient examined, no change in status, stable for surgery.  I have reviewed the patient's chart and labs.  Questions were answered to the patient's satisfaction.     [name],STEVEN R

## 2015-01-12 NOTE — Anesthesia Preprocedure Evaluation (Signed)
Anesthesia Evaluation  Patient identified by MRN, date of birth, ID band Patient awake    Reviewed: Allergy & Precautions, NPO status , Patient's Chart, lab work & pertinent test results  Airway Mallampati: II  TM Distance: >3 FB Neck ROM: Full    Dental   Pulmonary former smoker,  breath sounds clear to auscultation        Cardiovascular hypertension, Pt. on medications Rhythm:Regular Rate:Normal     Neuro/Psych  Headaches, Anxiety Multiple sclerosis  Neuromuscular disease    GI/Hepatic Neg liver ROS, GERD-  ,  Endo/Other  negative endocrine ROS  Renal/GU negative Renal ROS     Musculoskeletal  (+) Arthritis -, Rheumatoid disorders and on steriods ,    Abdominal   Peds  Hematology negative hematology ROS (+)   Anesthesia Other Findings   Reproductive/Obstetrics                             Anesthesia Physical Anesthesia Plan  ASA: III  Anesthesia Plan: General and Regional   Post-op Pain Management: GA combined w/ Regional for post-op pain   Induction: Intravenous  Airway Management Planned: Oral ETT  Additional Equipment:   Intra-op Plan:   Post-operative Plan: Extubation in OR  Informed Consent: I have reviewed the patients History and Physical, chart, labs and discussed the procedure including the risks, benefits and alternatives for the proposed anesthesia with the patient or authorized representative who has indicated his/her understanding and acceptance.   Dental advisory given  Plan Discussed with: CRNA  Anesthesia Plan Comments:         Anesthesia Quick Evaluation

## 2015-01-12 NOTE — Anesthesia Procedure Notes (Addendum)
Anesthesia Regional Block:  Interscalene brachial plexus block  Pre-Anesthetic Checklist: ,, timeout performed, Correct Patient, Correct Site, Correct Laterality, Correct Procedure, Correct Position, site marked, Risks and benefits discussed,  Surgical consent,  Pre-op evaluation,  At surgeon's request and post-op pain management  Laterality: Right  Prep: chloraprep       Needles:  Injection technique: Single-shot  Needle Type: Echogenic Stimulator Needle     Needle Length: 5cm 5 cm Needle Gauge: 21 and 21 G    Additional Needles:  Procedures: ultrasound guided (picture in chart) and nerve stimulator Interscalene brachial plexus block  Nerve Stimulator or Paresthesia:  Response: triceps, 0.5 mA,   Additional Responses:   Narrative:  Start time: 01/12/2015 12:22 PM End time: 01/12/2015 12:32 PM Injection made incrementally with aspirations every 5 mL.  Performed by: Personally  Anesthesiologist: Suzette Battiest  Additional Notes: Risks and benefits discussed. Pt tolerated well with no immediate complications.   Procedure Name: Intubation Date/Time: 01/12/2015 1:35 PM Performed by: Vennie Homans Pre-anesthesia Checklist: Patient identified, Timeout performed, Emergency Drugs available, Suction available and Patient being monitored Patient Re-evaluated:Patient Re-evaluated prior to inductionOxygen Delivery Method: Circle system utilized Preoxygenation: Pre-oxygenation with 100% oxygen Intubation Type: IV induction Ventilation: Mask ventilation without difficulty Laryngoscope Size: Mac and 3 Grade View: Grade I Tube type: Oral Tube size: 7.0 mm Number of attempts: 1 Airway Equipment and Method: Stylet Placement Confirmation: ETT inserted through vocal cords under direct vision,  positive ETCO2 and breath sounds checked- equal and bilateral Secured at: 21 cm Tube secured with: Tape Dental Injury: Teeth and Oropharynx as per pre-operative assessment

## 2015-01-13 NOTE — Op Note (Signed)
NAMESHARLIZE, HOAR             ACCOUNT NO.:  [id number]  MEDICAL RECORD NO.:  [id number]  LOCATION:  [facility]                         FACILITY:  [facility]  PHYSICIAN:  Doran Heater. Veverly Fells, M.D. 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  PROCEDURE PERFORMED:  Right shoulder arthroscopy with extensive intra- articular debridement of torn superior labrum, anterior to posterior, debridement of biceps stump, arthroscopic subacromial decompression followed by open distal clavicle resection.  ATTENDING SURGEON:  Doran Heater. Veverly Fells, M.D.  ASSISTANT:  Charletta Cousin [place], [name], who scrubbed the entire procedure and was necessary for satisfactory completion of surgery.  ANESTHESIA:  General anesthesia was used plus interscalene block.  ESTIMATED BLOOD LOSS:  Minimal.  FLUID REPLACEMENT:  1200 mL of crystalloid.  INSTRUMENT COUNTS:  Correct.  COMPLICATIONS:  There were no complications.  ANTIBIOTICS:  Perioperative antibiotics were given.  INDICATIONS:  The patient is a 74 year old female, who presents with history of worsening right shoulder pain secondary to [facility] joint arthritis.  The patient also has a history of biceps rupture over her long head of biceps remotely.  MRI scan indicates a retained SLAP tear and biceps stump.  The patient's rotator cuff appears to be in good condition.  There was some impingement noted with inflammation in the subdeltoid plane.  Given the failure of conservative management, the patient desires operative treatment to  restore function to eliminate pain.  Informed consent was obtained.  DESCRIPTION OF PROCEDURE:  After an adequate level of anesthesia was achieved, the patient was positioned in modified beach-chair position. Right shoulder was correctly identified and examined under anesthesia, full passive range of motion noted with no undue stiffness and no instability.  After a sterile prep and drape of the shoulder and arm, time-out was called.  We entered the shoulder using standard portals including anterior, posterior, and lateral portals.  We identified the significant tearing of the superior labrum and also about a centimeter portion of biceps that was still in the shoulder joint.  We removed that using basket forceps and motorized shaver back to smooth labral rim on the superior aspect of the shoulder.  Anterior, posterior and inferior labrum was intact.  Articular cartilage was normal.  Subscapularis was normal.  Rotator cuff was normal, was visualized from the anterior and posterior portal.  Placed the scope in subacromial space.  Thorough bursectomy and acromioplasty were performed creating a type 1 acromial shape with a butcher-block technique utilizing a high-speed bur.  We had nice decompression of the rotator cuff.  Finally, we were able to inspect the rotator cuff, palpating and visualizing and no signs of any tears.  We concluded the arthroscopic portion of the procedure, made a small Saber incision overlying the AC joint.  Dissection was down through the subcutaneous tissues using needle-tip Bovie.  Identified  the deltotrapezial fascia and divided in line with distal clavicle. Subperiosteal dissection of the distal clavicle was performed followed by excision of distal 3-4 mm using oscillating saw.  We irrigated thoroughly the AC interval, applied bone wax to the cut in the clavicle and then repaired the deltotrapezial fascia with interrupted 0 Vicryl suture followed by 2-0 Vicryl in  the subcutaneous closure and 4-0 Monocryl for skin and portals.  Steri-Strips were applied followed by sterile dressing.  The patient tolerated the surgery well.     Doran Heater. Veverly Fells, M.D.     SRN/MEDQ  D:  01/12/2015  T:  01/13/2015  Job:  [id number]

## 2015-01-15 DIAGNOSIS — S43431D Superior glenoid labrum lesion of right shoulder, subsequent encounter: Secondary | ICD-10-CM | POA: Diagnosis not present

## 2015-01-15 NOTE — Anesthesia Postprocedure Evaluation (Signed)
  Anesthesia Post-op Note  Patient: Sherri [name]  Procedure(s) Performed: Procedure(s): RIGHT SHOULDER ARTHROSCOPY WITH ANTERIOR SUBACROMIAL DECOMPRESSION AND OPEN DISTAL CLAVICLE RESECTION (Right)  Patient Location: PACU  Anesthesia Type:GA combined with regional for post-op pain  Level of Consciousness: awake, alert  and oriented  Airway and Oxygen Therapy: Patient Spontanous Breathing  Post-op Pain: mild  Post-op Assessment: Post-op Vital signs reviewed              Post-op Vital Signs: Reviewed  Last Vitals:  Filed Vitals:   01/12/15 1630  BP:   Pulse:   Temp: 36.3 C  Resp:     Complications: No apparent anesthesia complications

## 2015-01-16 ENCOUNTER — Encounter: Payer: Self-pay | Admitting: Internal Medicine

## 2015-01-18 ENCOUNTER — Telehealth: Payer: Self-pay | Admitting: Neurology

## 2015-01-18 ENCOUNTER — Other Ambulatory Visit: Payer: Self-pay

## 2015-01-18 DIAGNOSIS — S43431D Superior glenoid labrum lesion of right shoulder, subsequent encounter: Secondary | ICD-10-CM | POA: Diagnosis not present

## 2015-01-18 MED ORDER — TRAMADOL HCL 50 MG PO TABS: 50.0000 mg | ORAL_TABLET | ORAL | Status: DC

## 2015-01-18 MED ORDER — OXYCODONE HCL 10 MG PO TABS: 10.0000 mg | ORAL_TABLET | Freq: Two times a day (BID) | ORAL | Status: DC | PRN

## 2015-01-18 NOTE — Telephone Encounter (Signed)
Requests entered per instruction, forwarded to provider for signature.

## 2015-01-18 NOTE — Telephone Encounter (Signed)
Ok to refill 

## 2015-01-18 NOTE — Telephone Encounter (Signed)
Please advise on EKG.

## 2015-01-18 NOTE — Telephone Encounter (Signed)
Patient called requesting refill for traMADol (ULTRAM) 50 MG tablet and Oxycodone HCl 10 MG TABS . Patient advised RX will be ready within 24 hours unless otherwise informed by RN. Patient advised the office closes at 12pm  Tomorrow.

## 2015-01-18 NOTE — Telephone Encounter (Signed)
Requests entered, forwarded to provider for review.   Patient would like a refill on Oxycodone 10mg , last written 06/21 and Tramadol, prescribed in March.  Patient has follow up appt in October Rx for Percocet 10/325mg  was written by Dr Veverly Fells on 08/19 for one q 4 h prn # 60 due to a procedure.  Okay to refill the requested meds?  Thank you.

## 2015-01-18 NOTE — Telephone Encounter (Signed)
Please advise 

## 2015-01-19 ENCOUNTER — Encounter: Payer: Self-pay | Admitting: *Deleted

## 2015-01-19 NOTE — Progress Notes (Signed)
Oxycodone and Tramadol rx's up front [facility]

## 2015-01-22 DIAGNOSIS — S43431D Superior glenoid labrum lesion of right shoulder, subsequent encounter: Secondary | ICD-10-CM | POA: Diagnosis not present

## 2015-01-25 DIAGNOSIS — S43431D Superior glenoid labrum lesion of right shoulder, subsequent encounter: Secondary | ICD-10-CM | POA: Diagnosis not present

## 2015-02-01 DIAGNOSIS — Z79899 Other long term (current) drug therapy: Secondary | ICD-10-CM | POA: Diagnosis not present

## 2015-02-02 DIAGNOSIS — S43431D Superior glenoid labrum lesion of right shoulder, subsequent encounter: Secondary | ICD-10-CM | POA: Diagnosis not present

## 2015-02-06 ENCOUNTER — Encounter: Payer: Self-pay | Admitting: Internal Medicine

## 2015-02-12 DIAGNOSIS — M0579 Rheumatoid arthritis with rheumatoid factor of multiple sites without organ or systems involvement: Secondary | ICD-10-CM | POA: Diagnosis not present

## 2015-02-22 DIAGNOSIS — Z79899 Other long term (current) drug therapy: Secondary | ICD-10-CM | POA: Diagnosis not present

## 2015-02-22 LAB — BASIC METABOLIC PANEL
BUN: 15 mg/dL (ref 4–21)
CREATININE: 0.9 mg/dL (ref 0.5–1.1)
GLUCOSE: 81 mg/dL
Potassium: 4.4 mmol/L (ref 3.4–5.3)
SODIUM: 136 mmol/L — AB (ref 137–147)

## 2015-02-22 LAB — HEPATIC FUNCTION PANEL
ALK PHOS: 34 U/L (ref 25–125)
ALT: 18 U/L (ref 7–35)
AST: 24 U/L (ref 13–35)
Bilirubin, Total: 0.5 mg/dL

## 2015-03-12 DIAGNOSIS — M79641 Pain in right hand: Secondary | ICD-10-CM | POA: Diagnosis not present

## 2015-03-12 DIAGNOSIS — H2513 Age-related nuclear cataract, bilateral: Secondary | ICD-10-CM | POA: Diagnosis not present

## 2015-03-12 DIAGNOSIS — M79671 Pain in right foot: Secondary | ICD-10-CM | POA: Diagnosis not present

## 2015-03-12 DIAGNOSIS — M0579 Rheumatoid arthritis with rheumatoid factor of multiple sites without organ or systems involvement: Secondary | ICD-10-CM | POA: Diagnosis not present

## 2015-03-12 DIAGNOSIS — M79642 Pain in left hand: Secondary | ICD-10-CM | POA: Diagnosis not present

## 2015-03-12 DIAGNOSIS — Z09 Encounter for follow-up examination after completed treatment for conditions other than malignant neoplasm: Secondary | ICD-10-CM | POA: Diagnosis not present

## 2015-03-12 DIAGNOSIS — M79672 Pain in left foot: Secondary | ICD-10-CM | POA: Diagnosis not present

## 2015-03-13 DIAGNOSIS — D239 Other benign neoplasm of skin, unspecified: Secondary | ICD-10-CM | POA: Diagnosis not present

## 2015-03-13 DIAGNOSIS — L570 Actinic keratosis: Secondary | ICD-10-CM | POA: Diagnosis not present

## 2015-03-15 ENCOUNTER — Other Ambulatory Visit: Payer: Self-pay | Admitting: Internal Medicine

## 2015-03-22 DIAGNOSIS — R3912 Poor urinary stream: Secondary | ICD-10-CM | POA: Diagnosis not present

## 2015-03-22 DIAGNOSIS — N312 Flaccid neuropathic bladder, not elsewhere classified: Secondary | ICD-10-CM | POA: Diagnosis not present

## 2015-03-22 DIAGNOSIS — N390 Urinary tract infection, site not specified: Secondary | ICD-10-CM | POA: Diagnosis not present

## 2015-03-26 ENCOUNTER — Ambulatory Visit (INDEPENDENT_AMBULATORY_CARE_PROVIDER_SITE_OTHER): Payer: Medicare Other | Admitting: Neurology

## 2015-03-26 ENCOUNTER — Encounter: Payer: Self-pay | Admitting: Neurology

## 2015-03-26 VITALS — BP 134/76 | HR 68 | Resp 14 | Ht 62.0 in | Wt 114.6 lb

## 2015-03-26 DIAGNOSIS — M069 Rheumatoid arthritis, unspecified: Secondary | ICD-10-CM | POA: Diagnosis not present

## 2015-03-26 DIAGNOSIS — G4700 Insomnia, unspecified: Secondary | ICD-10-CM | POA: Diagnosis not present

## 2015-03-26 DIAGNOSIS — G35 Multiple sclerosis: Secondary | ICD-10-CM | POA: Diagnosis not present

## 2015-03-26 DIAGNOSIS — F418 Other specified anxiety disorders: Secondary | ICD-10-CM

## 2015-03-26 DIAGNOSIS — R269 Unspecified abnormalities of gait and mobility: Secondary | ICD-10-CM | POA: Diagnosis not present

## 2015-03-26 DIAGNOSIS — R200 Anesthesia of skin: Secondary | ICD-10-CM

## 2015-03-26 DIAGNOSIS — R5382 Chronic fatigue, unspecified: Secondary | ICD-10-CM | POA: Diagnosis not present

## 2015-03-26 DIAGNOSIS — R351 Nocturia: Secondary | ICD-10-CM

## 2015-03-26 MED ORDER — DIAZEPAM 5 MG PO TABS: 5.0000 mg | ORAL_TABLET | Freq: Three times a day (TID) | ORAL | Status: DC | PRN

## 2015-03-26 MED ORDER — TRAMADOL HCL 50 MG PO TABS: 50.0000 mg | ORAL_TABLET | Freq: Four times a day (QID) | ORAL | Status: DC

## 2015-03-26 MED ORDER — OXYCODONE-ACETAMINOPHEN 10-325 MG PO TABS: 1.0000 | ORAL_TABLET | ORAL | Status: DC | PRN

## 2015-03-26 NOTE — Progress Notes (Signed)
[facility]  PATIENT: Sherri [name] DOB: 03-19-1941  REFERRING CLINICIAN: Unice Cobble  HISTORY FROM: patient REASON FOR VISIT: Multiple sclerosis   HISTORICAL  CHIEF COMPLAINT:  Chief Complaint  Patient presents with  . Multiple Sclerosis    Not on any MS med.  She is on Xeljanz and Methotrexate for RA. Since last ov, she has had right tendon tear repaired by Dr. Veverly Fells at [place]:  Sherri [name] is a 74 year old woman who was diagnosed with relapsing remitting MS in 2010.    She also has RA and is on Xeljanz/Methotrexate.    She is not on any DMT for her MS as she likely gets beenfit from the RA drugs.   She denies any recent exacerbation.     Gait/strength:    She has mild gait disturbance and drags right foot when she is tired  and feels off balanced at times.  She stumbles but has not had any falls.      Sensation:   She feels the facial numbness is better than earlier this year.   The facial numbness involves the entire left side of the face in V2 and V3 > V1.    No arm/leg numbness.     Bladder:   She has urinary frequency for many years. U/S shows she does not completely empty.   She got a 'squatting potty' at her urologist's request and empties better.   She had 800 ml residual on recent Bladder-Scan.    She has had UTI's in the past.     Vision:   No MS related vision problems.     Fatigue/sleep:     Fatigue is doing ok with improved temperatures.   She is sleeping better with less stress She is taking  Valium 5 mg and Oxycodone 10 mg at bedtime and sleeps better with those med's.   Pain was affecting her sleep   She is active, walking daily.    Mood/cognition:     She notes mild depression with family and financial issues.  This is better since grand-daughter has moved out.   She does not note significant cognitive changes.   She is seeing Psych now with benefit.    Right Biceps:   She had surgery by Dr.  Veverly Fells for the ruptured biceps tendon and is doing well with improved function and reduced pain.     MS History:   When she was in her late 29s she had an episode of right-sided numbness and she lost the use of her right arm for a couple of weeks. At that time, she was told that she might have MS. Over the next 30 or 40 years she had several more similar but milder episodes. In 2010, she had an MRI of the cervical spine (for arm pain) and it showed foci c/w multiple sclerosis. An MRI of the brain was also performed  shortly thereafter showing white matter lesions consistent with the diagnosis.  Initially, she was placed on Avonex. She also has rheumatoid arthritis and was placed on methotrexate and then a combination of methotrexate with [name]. We decided to stop the Avonex as she probably would get some benefit from those medications for the MS as well. For the most part her MS has been stable over the past 5 years.However, early 2016,  she had numbness in the left face and weakness in her legs   REVIEW OF SYSTEMS:  Constitutional: No  fevers, chills, sweats, or change in appetite Eyes: No visual changes, double vision, eye pain Ear, nose and throat: No hearing loss, ear pain, nasal congestion, sore throat Cardiovascular: No chest pain, palpitations Respiratory:  No shortness of breath at rest or with exertion.   No wheezes GastrointestinaI: No nausea, vomiting, diarrhea, abdominal pain, fecal incontinence Genitourinary:  as above. Musculoskeletal:  No neck pain, back pain Integumentary: No rash, pruritus, skin lesions Neurological: as above Psychiatric: he has had a lot of family stress leading to some depression and more anxiety. Endocrine: No palpitations, diaphoresis, change in appetite, change in weigh or increased thirst Hematologic/Lymphatic:  No anemia, purpura, petechiae. Allergic/Immunologic: No itchy/runny eyes, nasal congestion, recent allergic reactions,  rashes  ALLERGIES: Allergies  Allergen Reactions  . Meperidine Hcl Shortness Of Breath  . Penicillins Hives  . Azathioprine Other (See Comments)    Weakness  . Darvon [Propoxyphene Hcl]     Lowered respirations greatly  . Ezetimibe-Simvastatin Nausea And Vomiting    HOME MEDICATIONS: Outpatient Prescriptions Prior to Visit  Medication Sig Dispense Refill  . atorvastatin (LIPITOR) 10 MG tablet 1 pill every Monday and Fri (Patient taking differently: Take 10 mg by mouth 2 (two) times a week. 1 pill every Monday and Fri) 30 tablet 1  . Biotin 5000 MCG TABS Take 5,000 mcg by mouth daily.     . Calcium Carbonate-Vitamin D (CALCIUM + D PO) Take 1 tablet by mouth daily. 1200 mg daily    . Cyanocobalamin (VITAMIN B-12 PO) Take 1 tablet by mouth daily.    [name] denosumab (PROLIA) 60 MG/ML SOLN injection Inject 60 mg into the skin every 6 (six) months. Administer in upper arm, thigh, or abdomen    . diazepam (VALIUM) 5 MG tablet Take 1 tablet (5 mg total) by mouth 3 (three) times daily as needed for anxiety. 90 tablet 3  . folic acid (FOLVITE) 1 MG tablet Take 1 mg by mouth 2 (two) times daily.    . furosemide (LASIX) 20 MG tablet TAKE 1 TABLET BY MOUTH EVERY DAY 90 tablet 1  . methotrexate (RHEUMATREX) 2.5 MG tablet Take 10 mg by mouth once a week.   2  . Omega-3 Fatty Acids (OMEGA 3 PO) Take 1,000 mg by mouth daily.     . Oxycodone HCl 10 MG TABS Take 1 tablet (10 mg total) by mouth 2 (two) times daily as needed. 60 tablet 0  . oxyCODONE-acetaminophen (PERCOCET) 10-325 MG per tablet Take 1 tablet by mouth every 4 (four) hours as needed for pain. 60 tablet 0  . predniSONE (DELTASONE) 5 MG tablet Take 5 mg by mouth daily.     . Pyridoxine HCl (VITAMIN B-6 PO) Take 1 tablet by mouth daily.    . ramipril (ALTACE) 5 MG capsule Take 1 capsule (5 mg total) by mouth daily with breakfast. 90 capsule 3  . tiZANidine (ZANAFLEX) 4 MG capsule Take 4 mg by mouth 3 (three) times daily as needed for muscle  spasms.    [name] tiZANidine (ZANAFLEX) 4 MG tablet Take 1 tablet (4 mg total) by mouth every 6 (six) hours as needed for muscle spasms. 30 tablet 0  . Tofacitinib Citrate (XELJANZ) 5 MG TABS Take 5 mg by mouth 2 (two) times daily.     . traMADol (ULTRAM) 50 MG tablet Take 1-2 tablets (50-100 mg total) by mouth every 4 (four) hours. Maximum dose= 8 tablets per day 240 tablet 2  . VOLTAREN 1 % GEL Apply 2 g topically  2 (two) times daily as needed (pain).   10  . ciclopirox (PENLAC) 8 % solution Apply topically at bedtime. Apply over nail and surrounding skin. Apply daily over previous coat. After seven (7) days, may remove with alcohol and continue cycle. (Patient not taking: Reported on 03/26/2015) 6.6 mL 0   No facility-administered medications prior to visit.    PAST MEDICAL HISTORY: Past Medical History  Diagnosis Date  . Neuromuscular disorder ([place])     Dr. Park Liter- in [place]- [facility] follows M.S.  . Cancer ([place])     basal cell ca, in situ- uterine   . Multiple sclerosis ([place])   . Skin cancer   . Rheumatoid arthritis(714.0)     Dr Estanislado Pandy takes [name] daily  . OA (osteoarthritis)     Dr Jani Gravel weekly  . Hypertension     takes Ramipril daily  . Anxiety     takes Valium daily as needed  . Hyperlipidemia     takes Atorvastatin on Mondays and Fridays  . Cataracts, bilateral   . PONV (postoperative nausea and vomiting)     trouble urinating after surgery in 2014  . History of bronchitis 6-90yrs ago  . Headache(784.0)     r/t neck issues  . Dizziness     r/t to meds  . Chronic back pain   . Joint pain   . Bruises easily     d/t meds  . Joint swelling   . GERD (gastroesophageal reflux disease)     no meds on a regular basis but will take Tums if needed  . History of colon polyps     benign  . Urinary retention     sees Dr.Wrenn about 2 times a yr  . Nocturia     PAST SURGICAL HISTORY: Past Surgical History  Procedure Laterality Date  . Lumbar fusion       Dr Joya Salm  . Appendectomy      with TAH  . Cervical fusion      Dr Joya Salm  . Carpal tunnel release    . Chest tube insertion      for traumatic Pneumothorax  . Colonoscopy w/ polypectomy  1997    negative since; Dr Deatra Ina  . Thyroid surgery      R lobe removed- 1972  . Abdominal hysterectomy      1985  . Nasal sinus surgery    . Total hip arthroplasty  12/22/2011    Procedure: TOTAL HIP ARTHROPLASTY;  Surgeon: Kerin Salen, MD;  Location: [facility];  Service: Orthopedics;  Laterality: Left;  . Cervical fusion  12/31/2012    Dr Joya Salm  . Posterior cervical fusion/foraminotomy N/A 12/31/2012    Procedure: POSTERIOR LATERAL CERVICAL FUSION/FORAMINOTOMY LEVEL 1 CERVICAL THREE-FOUR WITH LATERAL MASS SCREWS;  Surgeon: Floyce Stakes, MD;  Location: [facility];  Service: Neurosurgery;  Laterality: N/A;  . Colonoscopy  07/16/2010    normal   . Esophagogastroduodenoscopy  07/16/2010    normal  . Skin biopsy    . Back surgery      several    FAMILY HISTORY: Family History  Problem Relation Age of Onset  . Cancer Mother     pancreatic  . Diabetes Mother   . Heart disease Father     Rheumatic  . Cancer Father     ? stomach  . Cancer Sister     stomach  . Cancer Maternal Aunt     X 4; ? primary  . Heart disease  Paternal Aunt   . Hypertension Neg Hx   . Stroke Neg Hx   . Cancer Maternal Grandmother     cervical  . Colon cancer      Aunts  . Multiple sclerosis Daughter     SOCIAL HISTORY:  Social History   Social History  . Marital Status: Married    Spouse Name: N/A  . Number of Children: N/A  . Years of Education: N/A   Occupational History  . retired    Social History Main Topics  . Smoking status: Former Smoker    Quit date: 05/26/1981  . Smokeless tobacco: Not on file     Comment: Smoked [phone], up to 2.5 ppd  . Alcohol Use: Yes     Comment: 2-3 beers every day 2%  . Drug Use: No  . Sexual Activity: Yes   Other Topics Concern  . Not on file    Social History Narrative     PHYSICAL EXAM  Filed Vitals:   03/26/15 1316  BP: 134/76  Pulse: 68  Resp: 14  Height: 5\' 2"  (1.575 m)  Weight: 114 lb 9.6 oz (51.982 kg)    Body mass index is 20.96 kg/(m^2).   General: The patient is well-developed and well-nourished and in no acute distress  Musculoskeletal:   Right biceps bulge from ruptured tendon.    Skin: Extremities are without significant edema.  Neurologic Exam  Mental status: The patient is alert and oriented x 3 at the time of the examination. The patient has apparent normal recent and remote memory, with an apparently normal attention span and concentration ability.   Speech is normal.  CN:  EOMI, reduced facial sensation to soft touch left V2 more than V3 .Facial strength is normal.  Trapezius and sternocleidomastoid strength is normal. No dysarthria is noted.   No obvious hearing deficits are noted.  Motor:  Muscle bulk is normal and tone is mildly increased in her legs, left > right. Strength appears 5 / 5 in all 4 extremities.   (did not test proximal right arm due to biceps injury)  Sensory: Sensory testing is intact to touch in all 4 extremities.  Coordination: Cerebellar testing reveals good rapid alternating movements in handsy.  Gait and station: Station is stable with the eyes open and mild wobbling with eyes closed. Gait was mildly wide. She has moderately wide tandem walk.  Romberg is negative.   Reflexes: Deep tendon reflexes are symmetric and normal in legs.    DIAGNOSTIC DATA (LABS, IMAGING, TESTING) - I reviewed patient records, labs, notes, testing and imaging myself where available.  Lab Results  Component Value Date   WBC 9.0 01/04/2015   HGB 14.4 01/04/2015   HCT 42.3 01/04/2015   MCV 100.5* 01/04/2015   PLT 340 01/04/2015      Component Value Date/Time   NA 137 01/04/2015 1327   K 4.0 01/04/2015 1327   CL 101 01/04/2015 1327   CO2 25 01/04/2015 1327   GLUCOSE 91 01/04/2015  1327   BUN 11 01/04/2015 1327   CREATININE 0.87 01/04/2015 1327   CALCIUM 9.4 01/04/2015 1327   PROT 6.8 11/16/2014 0808   ALBUMIN 4.2 11/16/2014 0808   AST 21 11/16/2014 0808   ALT 17 11/16/2014 0808   ALKPHOS 32* 11/16/2014 0808   BILITOT 0.6 11/16/2014 0808   GFRNONAA >60 01/04/2015 1327   GFRAA >60 01/04/2015 1327   Lab Results  Component Value Date   CHOL 249* 11/16/2014   HDL 89.40  11/16/2014   LDLCALC 137* 11/16/2014   LDLDIRECT 122.9 11/15/2012   TRIG 111.0 11/16/2014   CHOLHDL 3 11/16/2014   Lab Results  Component Value Date   HGBA1C 5.6 10/24/2013   No results found for: VITAMINB12 Lab Results  Component Value Date   TSH 3.79 09/11/2014        ASSESSMENT AND PLAN  Multiple sclerosis (HCC)  Rheumatoid arthritis, involving unspecified site, unspecified rheumatoid factor presence (HCC)  Chronic fatigue  Gait disturbance  Numbness  Insomnia  Nocturia  Depression with anxiety    1.   She will continue on Xeljanz and methotrexate for RA --- methotrexate likely has some benefit in MS. However, if she has another possible relapse or if she does not do well with her RA, rituximab could be a good choice with efficacy for both diseases. 2.   Continue the oxycodone and valium at night to help her sleep 3.   Continue tramadol during the day.   . 4.    return to see [place] in 6 months or sooner if she has new or worsening neurologic symptoms.    Sahej Hauswirth A. Felecia Shelling, MD, PhD 16/02/9603, 5:40 PM Certified in Neurology, Clinical Neurophysiology, Sleep Medicine, Pain Medicine and Neuroimaging  [facility] [address] [place], [place] [address] [phone]

## 2015-04-03 LAB — HM DEXA SCAN: HM DEXA SCAN: -2.7

## 2015-04-04 ENCOUNTER — Encounter: Payer: Self-pay | Admitting: Internal Medicine

## 2015-04-05 ENCOUNTER — Ambulatory Visit (INDEPENDENT_AMBULATORY_CARE_PROVIDER_SITE_OTHER): Payer: Medicare Other | Admitting: Family

## 2015-04-05 ENCOUNTER — Telehealth: Payer: Self-pay | Admitting: Internal Medicine

## 2015-04-05 ENCOUNTER — Encounter: Payer: Self-pay | Admitting: Family

## 2015-04-05 VITALS — BP 112/72 | HR 92 | Temp 97.6°F | Resp 18 | Ht 62.0 in | Wt 112.8 lb

## 2015-04-05 DIAGNOSIS — R1013 Epigastric pain: Secondary | ICD-10-CM | POA: Insufficient documentation

## 2015-04-05 MED ORDER — DICYCLOMINE HCL 10 MG PO CAPS: 10.0000 mg | ORAL_CAPSULE | Freq: Three times a day (TID) | ORAL | Status: DC | PRN

## 2015-04-05 MED ORDER — RANITIDINE HCL 150 MG PO TABS: 150.0000 mg | ORAL_TABLET | Freq: Every day | ORAL | Status: DC | PRN

## 2015-04-05 NOTE — Telephone Encounter (Signed)
[place] [facility] Patient Name: ARAMINTA DERHAMMER DOB: 1940-11-19 Initial Comment caller states she had chest pain in the upper part of her chest that felt like a muscle spasm- no pain this am , but her chest is very sore Nurse Assessment Nurse: Markus Daft, [place], Sherre Poot Date/Time (Eastern Time): 04/05/2015 9:01:44 AM Confirm and document reason for call. If symptomatic, describe symptoms. ---Caller states she had chest pain in the upper part of her right side of chest that felt like a muscle spasm/cramp. Felt like she needed to burp. The discomfort, making it feel like it is hard to swallow. She can swallow. Denies pain this am, but her chest is very sore - tender to touch. Some hoarseness, Denies difficulty breathing or cough/cold/sore throat s/s. - Had esophagus work up a year ago and everything was negative. 2 days ago had "life line check" with EKG - and it was ok. Has the patient traveled out of the country within the last 30 days? ---Not Applicable Does the patient have any new or worsening symptoms? ---Yes Will a triage be completed? ---Yes Related visit to physician within the last 2 weeks? ---No Does the PT have any chronic conditions? (i.e. diabetes, asthma, etc.) ---Yes List chronic conditions. ---MS, and RA Guidelines Guideline Title Affirmed Question Affirmed Notes Chest Pain Chest pain lasts > 5 minutes (Exceptions: chest pain occurring > 3 days ago and now asymptomatic; same as previously diagnosed heartburn and has accompanying sour taste in mouth) Final Disposition User Go to ED Now (or PCP triage) Markus Daft, RN, Sherre Poot Comments No available appts with Dr. Linna Darner. Pt has 30 min. drive. Appt made with Dr. Elna Breslow for 10:45 am today. Referrals REFERRED TO PCP OFFICE Disagree/Comply: Comply

## 2015-04-05 NOTE — Progress Notes (Signed)
Pre visit review using our clinic review tool, if applicable. No additional management support is needed unless otherwise documented below in the visit note. 

## 2015-04-05 NOTE — Assessment & Plan Note (Signed)
Symptoms and exam consistent with dyspepsia. Start Zantac as needed. Start dicyclomine as needed for cramping. Recommend trying Gas-X for excessive pressure. Follow up if symptoms worsen or fail to improve.

## 2015-04-05 NOTE — Progress Notes (Signed)
Subjective:    Patient ID: Sherri [name], female    DOB: 1941-01-13, 74 y.o.   MRN: [id number]  Chief Complaint  Patient presents with  . Abdominal Pain    last night she felt like gas was building up on her right side and was having bad cramps, states that the feeling went up into her throat and it was hard to swallow, when she would drink water it felt like it would get caught     HPI:  Sherri [name] is a 74 y.o. female who  has a past medical history of Neuromuscular disorder ([place]); Cancer ([place]); Multiple sclerosis ([place]); Skin cancer; Rheumatoid arthritis(714.0); OA (osteoarthritis); Hypertension; Anxiety; Hyperlipidemia; Cataracts, bilateral; PONV (postoperative nausea and vomiting); History of bronchitis (6-93yrs ago); Headache(784.0); Dizziness; Chronic back pain; Joint pain; Bruises easily; Joint swelling; GERD (gastroesophageal reflux disease); History of colon polyps; Urinary retention; and Nocturia. and presents today for an acute office visit.   Associated symptom of pressure and gas building up in her abdomen occurred last evening with a severity enough to cause her throat to feel tight. Swallowing water become difficult. Denies any radiation. Modifying factors include TUMs, Prilosec, and Valium which did not help very much. Over time it became bearable enough for her to rest. She woke up this morning feeling sore and indicates that it feels funny when she is swallowing. She does have a history dysphasia.   Allergies  Allergen Reactions  . Meperidine Hcl Shortness Of Breath  . Penicillins Hives  . Azathioprine Other (See Comments)    Weakness  . Darvon [Propoxyphene Hcl]     Lowered respirations greatly  . Ezetimibe-Simvastatin Nausea And Vomiting     Current Outpatient Prescriptions on File Prior to Visit  Medication Sig Dispense Refill  . atorvastatin (LIPITOR) 10 MG tablet 1 pill every Monday and Fri (Patient taking differently: Take 10 mg by mouth 2 (two) times  a week. 1 pill every Monday and Fri) 30 tablet 1  . Biotin 5000 MCG TABS Take 5,000 mcg by mouth daily.     . Calcium Carbonate-Vitamin D (CALCIUM + D PO) Take 1 tablet by mouth daily. 1200 mg daily    . ciclopirox (PENLAC) 8 % solution Apply topically at bedtime. Apply over nail and surrounding skin. Apply daily over previous coat. After seven (7) days, may remove with alcohol and continue cycle. 6.6 mL 0  . Cyanocobalamin (VITAMIN B-12 PO) Take 1 tablet by mouth daily.    [name] denosumab (PROLIA) 60 MG/ML SOLN injection Inject 60 mg into the skin every 6 (six) months. Administer in upper arm, thigh, or abdomen    . diazepam (VALIUM) 5 MG tablet Take 1 tablet (5 mg total) by mouth 3 (three) times daily as needed for anxiety. 90 tablet 3  . FLUZONE HIGH-DOSE 0.5 ML SUSY TO BE ADMINISTERED BY PHARMACIST FOR IMMUNIZATION  0  . folic acid (FOLVITE) 1 MG tablet Take 1 mg by mouth 2 (two) times daily.    . furosemide (LASIX) 20 MG tablet TAKE 1 TABLET BY MOUTH EVERY DAY 90 tablet 1  . methotrexate (RHEUMATREX) 2.5 MG tablet Take 10 mg by mouth once a week.   2  . Omega-3 Fatty Acids (OMEGA 3 PO) Take 1,000 mg by mouth daily.     . Oxycodone HCl 10 MG TABS Take 1 tablet (10 mg total) by mouth 2 (two) times daily as needed. 60 tablet 0  . predniSONE (DELTASONE) 5 MG tablet Take 5 mg  by mouth daily.     . Pyridoxine HCl (VITAMIN B-6 PO) Take 1 tablet by mouth daily.    . ramipril (ALTACE) 5 MG capsule Take 1 capsule (5 mg total) by mouth daily with breakfast. 90 capsule 3  . tiZANidine (ZANAFLEX) 4 MG capsule Take 4 mg by mouth 3 (three) times daily as needed for muscle spasms.    . Tofacitinib Citrate (XELJANZ) 5 MG TABS Take 5 mg by mouth 2 (two) times daily.     . traMADol (ULTRAM) 50 MG tablet Take 1 tablet (50 mg total) by mouth 4 (four) times daily. 120 tablet 5  . VOLTAREN 1 % GEL Apply 2 g topically 2 (two) times daily as needed (pain).   10   No current facility-administered medications on file  prior to visit.     Review of Systems  Constitutional: Negative for fever and chills.  Cardiovascular: Negative for chest pain, palpitations and leg swelling.  Gastrointestinal: Positive for constipation and abdominal distention. Negative for nausea, vomiting and abdominal pain.      Objective:    BP 112/72 mmHg  Pulse 92  Temp(Src) 97.6 F (36.4 C) (Oral)  Resp 18  Ht 5\' 2"  (1.575 m)  Wt 112 lb 12.8 oz (51.166 kg)  BMI 20.63 kg/m2  SpO2 96% Nursing note and vital signs reviewed.  Physical Exam  Constitutional: She is oriented to person, place, and time. She appears well-developed and well-nourished. No distress.  Cardiovascular: Normal rate, regular rhythm, normal heart sounds and intact distal pulses.   Pulmonary/Chest: Effort normal and breath sounds normal.  Abdominal: Soft. Bowel sounds are normal. She exhibits no distension and no mass. There is no tenderness. There is no rebound and no guarding.  Neurological: She is alert and oriented to person, place, and time.  Skin: Skin is warm and dry.  Psychiatric: She has a normal mood and affect. Her behavior is normal. Judgment and thought content normal.       Assessment & Plan:   Problem List Items Addressed This Visit      Other   Dyspepsia - Primary    Symptoms and exam consistent with dyspepsia. Start Zantac as needed. Start dicyclomine as needed for cramping. Recommend trying Gas-X for excessive pressure. Follow up if symptoms worsen or fail to improve.       Relevant Medications   ranitidine (ZANTAC) 150 MG tablet   dicyclomine (BENTYL) 10 MG capsule

## 2015-04-05 NOTE — Patient Instructions (Signed)
Thank you for choosing [facility].  Summary/Instructions:  Your prescription(s) have been submitted to your pharmacy or been printed and provided for you. Please take as directed and contact our office if you believe you are having problem(s) with the medication(s) or have any questions.  If your symptoms worsen or fail to improve, please contact our office for further instruction, or in case of emergency go directly to the emergency room at the closest medical facility.   For your bloating use Gas-X (simethicone), Zantac as needed or dicyclomine as needed.

## 2015-04-17 ENCOUNTER — Other Ambulatory Visit: Payer: Self-pay | Admitting: Obstetrics and Gynecology

## 2015-04-17 DIAGNOSIS — N644 Mastodynia: Secondary | ICD-10-CM | POA: Diagnosis not present

## 2015-04-17 DIAGNOSIS — N6320 Unspecified lump in the left breast, unspecified quadrant: Secondary | ICD-10-CM

## 2015-04-24 DIAGNOSIS — N390 Urinary tract infection, site not specified: Secondary | ICD-10-CM | POA: Diagnosis not present

## 2015-04-24 DIAGNOSIS — Z79899 Other long term (current) drug therapy: Secondary | ICD-10-CM | POA: Diagnosis not present

## 2015-04-24 DIAGNOSIS — N312 Flaccid neuropathic bladder, not elsewhere classified: Secondary | ICD-10-CM | POA: Diagnosis not present

## 2015-04-25 ENCOUNTER — Ambulatory Visit
Admission: RE | Admit: 2015-04-25 | Discharge: 2015-04-25 | Disposition: A | Discharge status: Home / Self Care | Payer: Medicare Other | Source: Ambulatory Visit | Attending: Obstetrics and Gynecology | Admitting: Obstetrics and Gynecology

## 2015-04-25 DIAGNOSIS — N644 Mastodynia: Secondary | ICD-10-CM

## 2015-04-25 DIAGNOSIS — R922 Inconclusive mammogram: Secondary | ICD-10-CM | POA: Diagnosis not present

## 2015-04-25 DIAGNOSIS — N6320 Unspecified lump in the left breast, unspecified quadrant: Secondary | ICD-10-CM

## 2015-04-25 DIAGNOSIS — N6489 Other specified disorders of breast: Secondary | ICD-10-CM | POA: Diagnosis not present

## 2015-04-30 ENCOUNTER — Other Ambulatory Visit: Payer: Self-pay | Admitting: Neurology

## 2015-04-30 ENCOUNTER — Encounter: Payer: Self-pay | Admitting: *Deleted

## 2015-04-30 ENCOUNTER — Encounter: Payer: Self-pay | Admitting: Internal Medicine

## 2015-04-30 MED ORDER — OXYCODONE HCL 10 MG PO TABS: 10.0000 mg | ORAL_TABLET | Freq: Two times a day (BID) | ORAL | Status: DC | PRN

## 2015-04-30 NOTE — Progress Notes (Signed)
Oxycodone rx. up front [facility] 

## 2015-04-30 NOTE — Telephone Encounter (Signed)
Pt called sts RX for oxycodone with acetiminophen. She sts she cannot take anything that effects her liver. When she picked up RX she did not realize it so she has a full RX but can't take it . She needs Oxycodone HCl 10 MG TABS  And has 1 tab left. Please call and advise

## 2015-04-30 NOTE — Telephone Encounter (Signed)
Request forwarded to provider for review.

## 2015-05-04 ENCOUNTER — Telehealth: Payer: Self-pay

## 2015-05-04 NOTE — Telephone Encounter (Signed)
Call to Mrs. Roosevelt and agreed to come in Monday at 1pm for AWV; (12/12)

## 2015-05-07 ENCOUNTER — Encounter: Payer: Self-pay | Admitting: Internal Medicine

## 2015-05-07 ENCOUNTER — Ambulatory Visit (INDEPENDENT_AMBULATORY_CARE_PROVIDER_SITE_OTHER): Payer: Medicare Other

## 2015-05-07 VITALS — BP 110/60 | Ht 63.0 in | Wt 111.5 lb

## 2015-05-07 DIAGNOSIS — Z0000 Encounter for general adult medical examination without abnormal findings: Secondary | ICD-10-CM | POA: Diagnosis not present

## 2015-05-07 LAB — LAB REPORT - SCANNED
ALK PHOS: 34 U/L
ALT: 18
AST: 24 U/L
Albumin: 4.3
BILIRUBIN TOTAL: 0.5
BUN: 15 mg/dL (ref 4–21)
CALCIUM: 9.6 mg/dL
CO2: 30
Chloride: 98 mmol/L
Creat: 0.93
GFR: 62
GLUCOSE: 81
POTASSIUM: 4.4 mmol/L
Sodium: 136
Total Protein: 6.6 g/dL

## 2015-05-07 NOTE — Progress Notes (Signed)
Subjective:   Sherri [name] is a 74 y.o. female who presents for Medicare Annual (Subsequent) preventive examination.  Review of Systems:  HRA assessment completed during visit;  The Patient was informed that this wellness visit is to identify risk and educate on how to reduce risk for increase disease through lifestyle changes.   ROS deferred to CPE exam with physician in January  Medical issues HTN;  Hyperlipidemia/ Reviewed numbers  Lipids 10/2014; chol 249; trig 111; HDL 89; LDL 137 (A1c 5.6)   Right shoulder surgery 01/22/2015/ right arm; ROM; Recent ruptured bicep tendon; putting 2 baked potatoes in the oven;  ROM very good except for reaching behind her back; also does not pick up weight with right arm; has been told there is nothing that can be done for bicep tear;   Osteoporosis with DDD/ difficult to exercise due to MS; does try to walk 1 mile every day;   PT taken at Urology to strengthen bladder muscles; x 6 weeks;   Retained urine decreased and now able to void; Was retaining 800cc; the last visit 100cc only. Doing much better; Last ua culture was neg.   Pain in legs; unbearable on some days; nerve pain; comes and goes;   Prolia q 6 months x approx. 2 years; Last Dose was in Sept   Cervical Cancer; TAH/ BSO 1984; Carcinoma in situ  Psychosocial; Married;  has 2 children and spouse has 2 5 Grandchildren;  Worries over 2 grand children with issues; Stopped smoking in 1983; Drinks beer occasionally; [employer]; 2 % alcohol; aware she cannot drink with medications so drinks this low ETOH beer occasionally    Addressed tools for Stress relief; States she is very social and does feel better when visiting with old friends/ Also learning to say no;   After Christmas; planning a girlfriend weekend at the beach.   BMI: 19.7 comfortable weight; lifetime weight 108 and 115;  Diet; Eats just enough to get full or stomach will hurt;  Love fruits, veg; very little meat;  small amount of steak Salads; fruits; no sweets  Salt; downfall;   Exercise; Golf and dancer; Last fx of wrist and ankle stopped these activities;  Plans to get back to golf and dancing;   SAFETY/ 2 levels but live on main level; Bathroom is handicapped; Spouse was in a w/c when she bought the house. BR with walk in tub;  One bathroom has 2 huge showers; house has a ramp;  Spouse s/p Duane [place] post flu shot in 2000; s/p flu shot; so home was built to be handicapped accessible.   Community safety; smoke detectors and firearms safety reviewed;  sun protection; uses sunscreen;  Driving accidents no and seatbelt yes  Medication review/ lipids check when she has CPE  She recently was told by Dr. Estanislado Pandy to stop her Methotrexate. 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 Netta Cedars; operated on right shoulder Dr. Lia Hopping; MS    Cardiac Risk Factors include: dyslipidemia;hypertension     Objective:     Vitals: BP 110/60 mmHg  Ht 5\' 3"  (1.6 m)  Wt 111 lb 8 oz (50.576 kg)  BMI 19.76 kg/m2  Tobacco History  Smoking status  . Former Smoker  . Quit date:  05/26/1981  Smokeless tobacco  . Not on file    Comment: Smoked [phone], up to 2.5 ppd     Counseling given: Yes   Past Medical History  Diagnosis Date  . Neuromuscular disorder ([place])     Dr. Park Liter- in [place]- [facility] follows [facility]  . Cancer ([place])     basal cell ca, in situ- uterine   . Multiple sclerosis ([place])   . Skin cancer   . Rheumatoid arthritis(714.0)     Dr Estanislado Pandy takes [name] daily  . OA (osteoarthritis)     Dr Jani Gravel weekly  . Hypertension     takes Ramipril daily  . Anxiety     takes Valium daily as needed  . Hyperlipidemia     takes Atorvastatin on Mondays and Fridays  . Cataracts, bilateral   . PONV (postoperative nausea and vomiting)     trouble urinating after surgery in 2014  . History of bronchitis 6-58yrs ago  . Headache(784.0)     r/t neck issues  . Dizziness     r/t to meds  . Chronic back pain   . Joint pain   . Bruises easily     d/t meds  . Joint swelling   . GERD (gastroesophageal reflux disease)     no meds on a regular basis but will take Tums if needed  . History of colon polyps     benign  . Urinary retention     sees Dr.Wrenn about 2 times a yr  . Nocturia    Past Surgical History  Procedure Laterality Date  . Lumbar fusion      Dr Joya Salm  . Appendectomy      with TAH  . Cervical fusion      Dr Joya Salm  . Carpal tunnel release    . Chest tube insertion      for traumatic Pneumothorax  . Colonoscopy w/ polypectomy  1997    negative since; Dr Deatra Ina  . Thyroid surgery      R lobe removed- 1972  . Abdominal hysterectomy      1985  . Nasal sinus surgery    . Total hip arthroplasty  12/22/2011    Procedure: TOTAL HIP ARTHROPLASTY;  Surgeon: Kerin Salen, MD;  Location: [facility];  Service: Orthopedics;  Laterality: Left;  . Cervical fusion  12/31/2012    Dr Joya Salm  . Posterior cervical fusion/foraminotomy N/A 12/31/2012    Procedure: POSTERIOR LATERAL CERVICAL FUSION/FORAMINOTOMY LEVEL 1 CERVICAL THREE-FOUR WITH  LATERAL MASS SCREWS;  Surgeon: Floyce Stakes, MD;  Location: [facility];  Service: Neurosurgery;  Laterality: N/A;  . Colonoscopy  07/16/2010    normal   . Esophagogastroduodenoscopy  07/16/2010    normal  . Skin biopsy    . Back surgery      several   Family History  Problem Relation Age of Onset  . Cancer Mother     pancreatic  . Diabetes Mother   .  Heart disease Father     Rheumatic  . Cancer Father     ? stomach  . Cancer Sister     stomach  . Cancer Maternal Aunt     X 4; ? primary  . Heart disease Paternal Aunt   . Hypertension Neg Hx   . Stroke Neg Hx   . Cancer Maternal Grandmother     cervical  . Colon cancer      Aunts  . Multiple sclerosis Daughter    History  Sexual Activity  . Sexual Activity: Yes    Outpatient Encounter Prescriptions as of 05/07/2015  Medication Sig  . atorvastatin (LIPITOR) 10 MG tablet 1 pill every Monday and Fri (Patient taking differently: Take 10 mg by mouth 2 (two) times a week. 1 pill every Monday and Fri)  . Biotin 5000 MCG TABS Take 5,000 mcg by mouth daily.   . Calcium Carbonate-Vitamin D (CALCIUM + D PO) Take 1 tablet by mouth daily. 1200 mg daily  . ciclopirox (PENLAC) 8 % solution Apply topically at bedtime. Apply over nail and surrounding skin. Apply daily over previous coat. After seven (7) days, may remove with alcohol and continue cycle.  . Cyanocobalamin (VITAMIN B-12 PO) Take 1 tablet by mouth daily.  [name] denosumab (PROLIA) 60 MG/ML SOLN injection Inject 60 mg into the skin every 6 (six) months. Administer in upper arm, thigh, or abdomen  . diazepam (VALIUM) 5 MG tablet Take 1 tablet (5 mg total) by mouth 3 (three) times daily as needed for anxiety.  . dicyclomine (BENTYL) 10 MG capsule Take 1-2 capsules (10-20 mg total) by mouth 3 (three) times daily as needed for spasms.  [name] FLUZONE HIGH-DOSE 0.5 ML SUSY TO BE ADMINISTERED BY PHARMACIST FOR IMMUNIZATION  . folic acid (FOLVITE) 1 MG tablet Take 1 mg by mouth 2 (two)  times daily.  . furosemide (LASIX) 20 MG tablet TAKE 1 TABLET BY MOUTH EVERY DAY  . Omega-3 Fatty Acids (OMEGA 3 PO) Take 1,000 mg by mouth daily.   . Oxycodone HCl 10 MG TABS Take 1 tablet (10 mg total) by mouth 2 (two) times daily as needed.  . predniSONE (DELTASONE) 5 MG tablet Take 5 mg by mouth daily.   . Pyridoxine HCl (VITAMIN B-6 PO) Take 1 tablet by mouth daily.  . ramipril (ALTACE) 5 MG capsule Take 1 capsule (5 mg total) by mouth daily with breakfast.  . ranitidine (ZANTAC) 150 MG tablet Take 1-2 tablets (150-300 mg total) by mouth daily as needed for heartburn.  [name] tiZANidine (ZANAFLEX) 4 MG capsule Take 4 mg by mouth 3 (three) times daily as needed for muscle spasms.  . Tofacitinib Citrate (XELJANZ) 5 MG TABS Take 5 mg by mouth 2 (two) times daily.   . traMADol (ULTRAM) 50 MG tablet Take 1 tablet (50 mg total) by mouth 4 (four) times daily.  . VOLTAREN 1 % GEL Apply 2 g topically 2 (two) times daily as needed (pain).   . methotrexate (RHEUMATREX) 2.5 MG tablet Take 10 mg by mouth once a week.    No facility-administered encounter medications on file as of 05/07/2015.    Activities of Daily Living In your present state of health, do you have any difficulty performing the following activities: 05/07/2015 01/04/2015  Hearing? N N  Vision? N N  Difficulty concentrating or making decisions? N N  Walking or climbing stairs? N N  Dressing or bathing? N N  Doing errands, shopping? N N  Conservation officer, nature and  eating ? N -  Using the Toilet? N -  In the past six months, have you accidently leaked urine? (No Data) -  Do you have problems with loss of bowel control? N -  Managing your Medications? N -  Managing your Finances? N -  Housekeeping or managing your Housekeeping? N -    Patient Care Team: Hendricks Limes, MD as PCP - General Britt Bottom, MD (Neurology) Bo Merino, MD as Consulting Physician (Rheumatology)    Assessment:    Assessment   Patient presents for  yearly preventative medicine examination. Medicare questionnaire screening were completed, i.e. Functional; fall risk; depression, memory loss; hearing loss in right ear; could not hear audiometer tones today and will try to get hearing checked soon; especially for Left ear.   All immunizations and health maintenance protocols were reviewed with the patient and the patient is up to date.   Education provided for laboratory screens;  Will have repeats lipids with CPE  Medication reconciliation, past medical history, social history, problem list and allergies were reviewed in detail with the patient;   Goals were established with regard to stress reduction and exercise; was released to try and plan golf again and may wait until spring since she bicep tendon ruptured; will try to start dancing again   End of life planning was discussed.   Exercise Activities and Dietary recommendations Current Exercise Habits:: Home exercise routine, Type of exercise: walking, Time (Minutes): 20 (may walk a mile every day), Intensity: Mild  Goals    . golf or dancing     Relaxation and playing golf when able as well as dancing       Fall Risk Fall Risk  05/07/2015 09/08/2014 11/15/2012  Falls in the past year? No No No   Depression Screen PHQ 2/9 Scores 05/07/2015 09/08/2014 11/15/2012  PHQ - 2 Score 0 0 0    AD8 score 0; no failures at daily living; Keeps complex schedule.works crossword puzzles daily.  Cognitive Testing No flowsheet data found.  Immunization History  Administered Date(s) Administered  . Influenza Whole 02/22/2007, 02/08/2013  . Influenza, High Dose Seasonal PF 02/04/2015  . Influenza-Unspecified 01/24/2014  . Pneumococcal Conjugate-13 11/23/2012, 09/08/2014  . Pneumococcal Polysaccharide-23 05/26/2006  . Pneumococcal-Unspecified 12/28/2012  . Td 05/28/2010  . Zoster 05/26/2009   Screening Tests Health Maintenance  Topic Date Due  . INFLUENZA VACCINE  12/25/2015  .  MAMMOGRAM  10/18/2016  . TETANUS/TDAP  05/28/2020  . COLONOSCOPY  07/16/2020  . DEXA SCAN  Completed  . ZOSTAVAX  Completed  . PNA vac Low Risk Adult  Completed      Plan:    During the course of the visit the patient was educated and counseled about the following appropriate screening and preventive services:   Vaccines to include Pneumoccal, Influenza, Hepatitis B, Td, Zostavax, HCV/ non due  Electrocardiogram/ 12/2009  Cardiovascular Disease/ mild htn; well controlled  Colorectal cancer screening  Bone density screening/ osteoporosis; current tx Prolia x 2 years reported   Diabetes screening/n/a  Glaucoma screening/Last eye exam was neg  Mammography/PAP/ just completed with u/s and was neg.  Nutrition counseling / no problem voiced; weight stable very long time  Patient Instructions (the written plan) was given to the patient.   Wynetta Fines, RN  05/07/2015

## 2015-05-07 NOTE — Patient Instructions (Addendum)
[name] , Thank you for taking time to come for your Medicare Wellness Visit. I appreciate your ongoing commitment to your health goals. Please review the following plan we discussed and let me know if I can assist you in the future.   Will have hearing test next year  These are the goals we discussed: Goals    . golf or dancing     Relaxation and playing golf when able as well as dancing        This is a list of the screening recommended for you and due dates:  Health Maintenance  Topic Date Due  . Flu Shot  12/25/2015  . Mammogram  10/18/2016  . Tetanus Vaccine  05/28/2020  . Colon Cancer Screening  07/16/2020  . DEXA scan (bone density measurement)  Completed  . Shingles Vaccine  Completed  . Pneumonia vaccines  Completed     Fall Prevention in the Home  Falls can cause injuries. They can happen to people of all ages. There are many things you can do to make your home safe and to help prevent falls.  WHAT CAN I DO ON THE OUTSIDE OF MY HOME?  Regularly fix the edges of walkways and driveways and fix any cracks.  Remove anything that might make you trip as you walk through a door, such as a raised step or threshold.  Trim any bushes or trees on the path to your home.  Use bright outdoor lighting.  Clear any walking paths of anything that might make someone trip, such as rocks or tools.  Regularly check to see if handrails are loose or broken. Make sure that both sides of any steps have handrails.  Any raised decks and porches should have guardrails on the edges.  Have any leaves, snow, or ice cleared regularly.  Use sand or salt on walking paths during winter.  Clean up any spills in your garage right away. This includes oil or grease spills. WHAT CAN I DO IN THE BATHROOM?   Use night lights.  Install grab bars by the toilet and in the tub and shower. Do not use towel bars as grab bars.  Use non-skid mats or decals in the tub or shower.  If you need to  sit down in the shower, use a plastic, non-slip stool.  Keep the floor dry. Clean up any water that spills on the floor as soon as it happens.  Remove soap buildup in the tub or shower regularly.  Attach bath mats securely with double-sided non-slip rug tape.  Do not have throw rugs and other things on the floor that can make you trip. WHAT CAN I DO IN THE BEDROOM?  Use night lights.  Make sure that you have a light by your bed that is easy to reach.  Do not use any sheets or blankets that are too big for your bed. They should not hang down onto the floor.  Have a firm chair that has side arms. You can use this for support while you get dressed.  Do not have throw rugs and other things on the floor that can make you trip. WHAT CAN I DO IN THE KITCHEN?  Clean up any spills right away.  Avoid walking on wet floors.  Keep items that you use a lot in easy-to-reach places.  If you need to reach something above you, use a strong step stool that has a grab bar.  Keep electrical cords out of the way.  Do  not use floor polish or wax that makes floors slippery. If you must use wax, use non-skid floor wax.  Do not have throw rugs and other things on the floor that can make you trip. WHAT CAN I DO WITH MY STAIRS?  Do not leave any items on the stairs.  Make sure that there are handrails on both sides of the stairs and use them. Fix handrails that are broken or loose. Make sure that handrails are as long as the stairways.  Check any carpeting to make sure that it is firmly attached to the stairs. Fix any carpet that is loose or worn.  Avoid having throw rugs at the top or bottom of the stairs. If you do have throw rugs, attach them to the floor with carpet tape.  Make sure that you have a light switch at the top of the stairs and the bottom of the stairs. If you do not have them, ask someone to add them for you. WHAT ELSE CAN I DO TO HELP PREVENT FALLS?  Wear shoes that:  Do not  have high heels.  Have rubber bottoms.  Are comfortable and fit you well.  Are closed at the toe. Do not wear sandals.  If you use a stepladder:  Make sure that it is fully opened. Do not climb a closed stepladder.  Make sure that both sides of the stepladder are locked into place.  Ask someone to hold it for you, if possible.  Clearly mark and make sure that you can see:  Any grab bars or handrails.  First and last steps.  Where the edge of each step is.  Use tools that help you move around (mobility aids) if they are needed. These include:  Canes.  Walkers.  Scooters.  Crutches.  Turn on the lights when you go into a dark area. Replace any light bulbs as soon as they burn out.  Set up your furniture so you have a clear path. Avoid moving your furniture around.  If any of your floors are uneven, fix them.  If there are any pets around you, be aware of where they are.  Review your medicines with your doctor. Some medicines can make you feel dizzy. This can increase your chance of falling. Ask your doctor what other things that you can do to help prevent falls.   This information is not intended to replace advice given to you by your health care provider. Make sure you discuss any questions you have with your health care provider.   Document Released: 03/08/2009 Document Revised: 09/26/2014 Document Reviewed: 06/16/2014 Elsevier Interactive Patient Education 2016 [place] Maintenance, Female Adopting a healthy lifestyle and getting preventive care can go a long way to promote health and wellness. Talk with your health care provider about what schedule of regular examinations is right for you. This is a good chance for you to check in with your provider about disease prevention and staying healthy. In between checkups, there are plenty of things you can do on your own. Experts have done a lot of research about which lifestyle changes and preventive  measures are most likely to keep you healthy. Ask your health care provider for more information. WEIGHT AND DIET  Eat a healthy diet  Be sure to include plenty of vegetables, fruits, low-fat dairy products, and lean protein.  Do not eat a lot of foods high in solid fats, added sugars, or salt.  Get regular exercise. This is one of  the most important things you can do for your health.  Most adults should exercise for at least 150 minutes each week. The exercise should increase your heart rate and make you sweat (moderate-intensity exercise).  Most adults should also do strengthening exercises at least twice a week. This is in addition to the moderate-intensity exercise.  Maintain a healthy weight  Body mass index (BMI) is a measurement that can be used to identify possible weight problems. It estimates body fat based on height and weight. Your health care provider can help determine your BMI and help you achieve or maintain a healthy weight.  For females 52 years of age and older:   A BMI below 18.5 is considered underweight.  A BMI of 18.5 to 24.9 is normal.  A BMI of 25 to 29.9 is considered overweight.  A BMI of 30 and above is considered obese.  Watch levels of cholesterol and blood lipids  You should start having your blood tested for lipids and cholesterol at 73 years of age, then have this test every 5 years.  You may need to have your cholesterol levels checked more often if:  Your lipid or cholesterol levels are high.  You are older than 73 years of age.  You are at high risk for heart disease.  CANCER SCREENING   Lung Cancer  Lung cancer screening is recommended for adults 57-78 years old who are at high risk for lung cancer because of a history of smoking.  A yearly low-dose CT scan of the lungs is recommended for people who:  Currently smoke.  Have quit within the past 15 years.  Have at least a 30-pack-year history of smoking. A pack year is smoking  an average of one pack of cigarettes a day for 1 year.  Yearly screening should continue until it has been 15 years since you quit.  Yearly screening should stop if you develop a health problem that would prevent you from having lung cancer treatment.  Breast Cancer  Practice breast self-awareness. This means understanding how your breasts normally appear and feel.  It also means doing regular breast self-exams. Let your health care provider know about any changes, no matter how small.  If you are in your 20s or 30s, you should have a clinical breast exam (CBE) by a health care provider every 1-3 years as part of a regular health exam.  If you are 90 or older, have a CBE every year. Also consider having a breast X-ray (mammogram) every year.  If you have a family history of breast cancer, talk to your health care provider about genetic screening.  If you are at high risk for breast cancer, talk to your health care provider about having an MRI and a mammogram every year.  Breast cancer gene (BRCA) assessment is recommended for women who have family members with BRCA-related cancers. BRCA-related cancers include:  Breast.  Ovarian.  Tubal.  Peritoneal cancers.  Results of the assessment will determine the need for genetic counseling and BRCA1 and BRCA2 testing. Cervical Cancer Your health care provider may recommend that you be screened regularly for cancer of the pelvic organs (ovaries, uterus, and vagina). This screening involves a pelvic examination, including checking for microscopic changes to the surface of your cervix (Pap test). You may be encouraged to have this screening done every 3 years, beginning at age 6.  For women ages 24-65, health care providers may recommend pelvic exams and Pap testing every 3 years, or they  may recommend the Pap and pelvic exam, combined with testing for human papilloma virus (HPV), every 5 years. Some types of HPV increase your risk of cervical  cancer. Testing for HPV may also be done on women of any age with unclear Pap test results.  Other health care providers may not recommend any screening for nonpregnant women who are considered low risk for pelvic cancer and who do not have symptoms. Ask your health care provider if a screening pelvic exam is right for you.  If you have had past treatment for cervical cancer or a condition that could lead to cancer, you need Pap tests and screening for cancer for at least 20 years after your treatment. If Pap tests have been discontinued, your risk factors (such as having a new sexual partner) need to be reassessed to determine if screening should resume. Some women have medical problems that increase the chance of getting cervical cancer. In these cases, your health care provider may recommend more frequent screening and Pap tests. Colorectal Cancer  This type of cancer can be detected and often prevented.  Routine colorectal cancer screening usually begins at 73 years of age and continues through 73 years of age.  Your health care provider may recommend screening at an earlier age if you have risk factors for colon cancer.  Your health care provider may also recommend using home test kits to check for hidden blood in the stool.  A small camera at the end of a tube can be used to examine your colon directly (sigmoidoscopy or colonoscopy). This is done to check for the earliest forms of colorectal cancer.  Routine screening usually begins at age 42.  Direct examination of the colon should be repeated every 5-10 years through 73 years of age. However, you may need to be screened more often if early forms of precancerous polyps or small growths are found. Skin Cancer  Check your skin from head to toe regularly.  Tell your health care provider about any new moles or changes in moles, especially if there is a change in a mole's shape or color.  Also tell your health care provider if you have a  mole that is larger than the size of a pencil eraser.  Always use sunscreen. Apply sunscreen liberally and repeatedly throughout the day.  Protect yourself by wearing long sleeves, pants, a wide-brimmed hat, and sunglasses whenever you are outside. HEART DISEASE, DIABETES, AND HIGH BLOOD PRESSURE   High blood pressure causes heart disease and increases the risk of stroke. High blood pressure is more likely to develop in:  People who have blood pressure in the high end of the normal range (130-139/85-89 mm Hg).  People who are overweight or obese.  People who are African American.  If you are 35-87 years of age, have your blood pressure checked every 3-5 years. If you are 71 years of age or older, have your blood pressure checked every year. You should have your blood pressure measured twice--once when you are at a hospital or clinic, and once when you are not at a hospital or clinic. Record the average of the two measurements. To check your blood pressure when you are not at a hospital or clinic, you can use:  An automated blood pressure machine at a pharmacy.  A home blood pressure monitor.  If you are between 42 years and 85 years old, ask your health care provider if you should take aspirin to prevent strokes.  Have regular diabetes  screenings. This involves taking a blood sample to check your fasting blood sugar level.  If you are at a normal weight and have a low risk for diabetes, have this test once every three years after 73 years of age.  If you are overweight and have a high risk for diabetes, consider being tested at a younger age or more often. PREVENTING INFECTION  Hepatitis B  If you have a higher risk for hepatitis B, you should be screened for this virus. You are considered at high risk for hepatitis B if:  You were born in a country where hepatitis B is common. Ask your health care provider which countries are considered high risk.  Your parents were born in a  high-risk country, and you have not been immunized against hepatitis B (hepatitis B vaccine).  You have HIV or AIDS.  You use needles to inject street drugs.  You live with someone who has hepatitis B.  You have had sex with someone who has hepatitis B.  You get hemodialysis treatment.  You take certain medicines for conditions, including cancer, organ transplantation, and autoimmune conditions. Hepatitis C  Blood testing is recommended for:  Everyone born from [birth year] through 1964.  Anyone with known risk factors for hepatitis C. Sexually transmitted infections (STIs)  You should be screened for sexually transmitted infections (STIs) including gonorrhea and chlamydia if:  You are sexually active and are younger than 73 years of age.  You are older than 73 years of age and your health care provider tells you that you are at risk for this type of infection.  Your sexual activity has changed since you were last screened and you are at an increased risk for chlamydia or gonorrhea. Ask your health care provider if you are at risk.  If you do not have HIV, but are at risk, it may be recommended that you take a prescription medicine daily to prevent HIV infection. This is called pre-exposure prophylaxis (PrEP). You are considered at risk if:  You are sexually active and do not regularly use condoms or know the HIV status of your partner(s).  You take drugs by injection.  You are sexually active with a partner who has HIV. Talk with your health care provider about whether you are at high risk of being infected with HIV. If you choose to begin PrEP, you should first be tested for HIV. You should then be tested every 3 months for as long as you are taking PrEP.  PREGNANCY   If you are premenopausal and you may become pregnant, ask your health care provider about preconception counseling.  If you may become pregnant, take 400 to 800 micrograms (mcg) of folic acid every day.  If you  want to prevent pregnancy, talk to your health care provider about birth control (contraception). OSTEOPOROSIS AND MENOPAUSE   Osteoporosis is a disease in which the bones lose minerals and strength with aging. This can result in serious bone fractures. Your risk for osteoporosis can be identified using a bone density scan.  If you are 31 years of age or older, or if you are at risk for osteoporosis and fractures, ask your health care provider if you should be screened.  Ask your health care provider whether you should take a calcium or vitamin D supplement to lower your risk for osteoporosis.  Menopause may have certain physical symptoms and risks.  Hormone replacement therapy may reduce some of these symptoms and risks. Talk to your health care  provider about whether hormone replacement therapy is right for you.  HOME CARE INSTRUCTIONS   Schedule regular health, dental, and eye exams.  Stay current with your immunizations.   Do not use any tobacco products including cigarettes, chewing tobacco, or electronic cigarettes.  If you are pregnant, do not drink alcohol.  If you are breastfeeding, limit how much and how often you drink alcohol.  Limit alcohol intake to no more than 1 drink per day for nonpregnant women. One drink equals 12 ounces of beer, 5 ounces of wine, or 1 ounces of hard liquor.  Do not use street drugs.  Do not share needles.  Ask your health care provider for help if you need support or information about quitting drugs.  Tell your health care provider if you often feel depressed.  Tell your health care provider if you have ever been abused or do not feel safe at home.   This information is not intended to replace advice given to you by your health care provider. Make sure you discuss any questions you have with your health care provider.   Document Released: 11/25/2010 Document Revised: 06/02/2014 Document Reviewed: 04/13/2013 Elsevier Interactive Patient  Education [employer].

## 2015-05-07 NOTE — Progress Notes (Signed)
Medical screening examination/treatment/procedure(s) were performed by non-physician practitioner and as supervising physician I was immediately available for consultation/collaboration. I agree with above. Marquavion Venhuizen, MD   

## 2015-05-09 DIAGNOSIS — D0462 Carcinoma in situ of skin of left upper limb, including shoulder: Secondary | ICD-10-CM | POA: Diagnosis not present

## 2015-05-09 DIAGNOSIS — L570 Actinic keratosis: Secondary | ICD-10-CM | POA: Diagnosis not present

## 2015-05-14 ENCOUNTER — Other Ambulatory Visit: Payer: Self-pay | Admitting: Internal Medicine

## 2015-05-14 ENCOUNTER — Telehealth: Payer: Self-pay | Admitting: Internal Medicine

## 2015-05-14 DIAGNOSIS — R112 Nausea with vomiting, unspecified: Secondary | ICD-10-CM

## 2015-05-14 MED ORDER — ONDANSETRON HCL 4 MG PO TABS: 4.0000 mg | ORAL_TABLET | Freq: Three times a day (TID) | ORAL | Status: DC | PRN

## 2015-05-14 NOTE — Telephone Encounter (Signed)
Pt has been throwing up since last night. She has nausea and diarrhea. She is wondering if you can call something in. She states she is too sick to come in.  Pharmacy is [facility] on [address]

## 2015-05-14 NOTE — Telephone Encounter (Signed)
Please advise. Patient states she is too sick to come in for appointment and would like medication for nausea and vomiting.

## 2015-05-29 MED FILL — XELJANZ XR 11 MG TB24: 11 | 30 days supply | Qty: 30 | Fill #1

## 2015-06-06 ENCOUNTER — Other Ambulatory Visit: Payer: Self-pay | Admitting: Neurology

## 2015-06-06 ENCOUNTER — Encounter: Payer: Self-pay | Admitting: *Deleted

## 2015-06-06 MED ORDER — OXYCODONE HCL 10 MG PO TABS: 10.0000 mg | ORAL_TABLET | Freq: Two times a day (BID) | ORAL | Status: DC | PRN

## 2015-06-06 MED ORDER — DIAZEPAM 5 MG PO TABS: 5.0000 mg | ORAL_TABLET | Freq: Three times a day (TID) | ORAL | Status: DC | PRN

## 2015-06-06 MED ORDER — TRAMADOL HCL 50 MG PO TABS: 50.0000 mg | ORAL_TABLET | Freq: Four times a day (QID) | ORAL | Status: DC

## 2015-06-06 NOTE — Progress Notes (Signed)
Valium, Oxycodone and Tramadol rx's up front [facility]

## 2015-06-06 NOTE — Telephone Encounter (Signed)
Patient called to request refill ofOxycodone HCl 10 MG TABS (without acetaminophen),  traMADol (ULTRAM) 50 MG tablet and diazepam (VALIUM) 5 MG tablet.

## 2015-06-13 ENCOUNTER — Ambulatory Visit (INDEPENDENT_AMBULATORY_CARE_PROVIDER_SITE_OTHER): Payer: Medicare Other | Admitting: Internal Medicine

## 2015-06-13 ENCOUNTER — Encounter: Payer: Self-pay | Admitting: Internal Medicine

## 2015-06-13 VITALS — BP 122/70 | HR 78 | Temp 97.8°F | Resp 16 | Wt 110.0 lb

## 2015-06-13 DIAGNOSIS — E785 Hyperlipidemia, unspecified: Secondary | ICD-10-CM

## 2015-06-13 DIAGNOSIS — I10 Essential (primary) hypertension: Secondary | ICD-10-CM

## 2015-06-13 DIAGNOSIS — G35 Multiple sclerosis: Secondary | ICD-10-CM | POA: Diagnosis not present

## 2015-06-13 DIAGNOSIS — R1319 Other dysphagia: Secondary | ICD-10-CM

## 2015-06-13 DIAGNOSIS — E041 Nontoxic single thyroid nodule: Secondary | ICD-10-CM

## 2015-06-13 DIAGNOSIS — M069 Rheumatoid arthritis, unspecified: Secondary | ICD-10-CM | POA: Diagnosis not present

## 2015-06-13 DIAGNOSIS — R1310 Dysphagia, unspecified: Secondary | ICD-10-CM

## 2015-06-13 DIAGNOSIS — R351 Nocturia: Secondary | ICD-10-CM

## 2015-06-13 DIAGNOSIS — Z8639 Personal history of other endocrine, nutritional and metabolic disease: Secondary | ICD-10-CM

## 2015-06-13 NOTE — Assessment & Plan Note (Signed)
Has had a swallowing evaluation and CT of the neck in the past. Symptoms improved, but haven't gotten worse. 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

## 2015-06-13 NOTE — Assessment & Plan Note (Signed)
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

## 2015-06-13 NOTE — Progress Notes (Signed)
Subjective:    Patient ID: Sherri [name], female    DOB: [birth year], 75 y.o.   MRN: [id number]  HPI She is here to establish with a new pcp.   Multiple sclerosis:  She is following with neurology and is taking xeljanz.  She takes valium and oxycodone for the pain and muscle spasms at night in order to sleep.  She takes tiazanidine only as needed.  She has not had an exacerbation in 6 months.  RA:  She take prednisone daily.  She takes valvium and oxycodone at night for her RA pain and muslce spasms from the MS.  she did not take any specific rheumatoid arthritis medication because of her multiple sclerosis.  Hypertension: She is taking her medication daily. She is compliant with a low sodium diet.  She denies chest pain, palpitations (except with anxiety), edema, shortness of breath and regular headaches. She is exercising regularly.    Hyperlipidemia: She is taking her medication daily. She is compliant with a low fat/cholesterol diet. She is exercising regularly. She denies myalgias.   Dysphagia: She has a history of difficulty swallowing and had an evaluation in the past including a swallow evaluation and CT of the neck. These were both normal at that time. Her symptoms have returned and gotten worse recently. She states difficulty swallowing large pills. She has a history of thyroid nodule and she believes she had the right side of her thyroid removed, but last ultrasound showed that it was present. She is concerned about her thyroid being enlarged and causing difficulty swallowing.   She follows with urology (MS bladder), neurology, rheumatology    Medications and allergies reviewed with patient and updated if appropriate.  Patient Active Problem List   Diagnosis Date Noted  . Dyspepsia 04/05/2015  . Numbness 11/14/2014  . Biceps tendon tear 10/10/2014  . Insomnia 10/10/2014  . Gait disturbance 06/13/2014  . 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 Other dysphagia 12/29/2013    . Hoarseness 12/29/2013  . 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 Personal history of colonic polyps 07/03/2010  . Nocturia 05/28/2010  . POSTURAL HYPOTENSION 11/14/2008  . THYROID NODULE 05/17/2008  . Multiple sclerosis ([place]) 05/17/2008  . HYPERGLYCEMIA, BORDERLINE 05/17/2008  . Chronic fatigue 03/30/2007  . Hyperlipidemia 08/20/2006  . HYPERTENSION 08/20/2006  . Rheumatoid arthritis ([place]) 08/20/2006  . CERVICAL CANCER, HX OF 08/20/2006  . GASTRIC ULCER, HX OF 08/20/2006    Current Outpatient Prescriptions on File Prior to Visit  Medication Sig Dispense Refill  . atorvastatin (LIPITOR) 10 MG tablet 1 pill every Monday and Fri (Patient taking differently: Take 10 mg by mouth 2 (two) times a week. 1 pill every Monday and Fri) 30 tablet 1  . Biotin 5000 MCG TABS Take 5,000 mcg by mouth daily.     . Calcium Carbonate-Vitamin D (CALCIUM + D PO) Take 1 tablet by mouth daily. 1200 mg daily    . Cyanocobalamin (VITAMIN B-12 PO) Take 1 tablet by mouth daily.    [name] denosumab (PROLIA) 60 MG/ML SOLN injection Inject 60 mg into the skin every 6 (six) months. Administer in upper arm, thigh, or abdomen    . diazepam (VALIUM) 5 MG tablet Take 1 tablet (5 mg total) by mouth 3 (three) times daily as needed for anxiety. 90 tablet 0  . dicyclomine (BENTYL) 10 MG capsule Take 1-2 capsules (10-20 mg total) by mouth 3 (three) times daily as needed for spasms.  30 capsule 0  . FLUZONE HIGH-DOSE 0.5 ML SUSY TO BE ADMINISTERED BY PHARMACIST FOR IMMUNIZATION  0  . folic acid (FOLVITE) 1 MG tablet Take 1 mg by mouth 2 (two) times daily.    . furosemide (LASIX) 20 MG tablet TAKE 1 TABLET BY MOUTH EVERY DAY 90 tablet 1  . Omega-3 Fatty Acids (OMEGA 3 PO) Take 1,000 mg by mouth daily.     . Oxycodone HCl 10 MG TABS Take 1 tablet (10 mg total) by mouth 2 (two) times daily as needed. (Patient taking differently: Take 10 mg by mouth daily. ) 60 tablet 0  .  predniSONE (DELTASONE) 5 MG tablet Take 5 mg by mouth daily.     . Pyridoxine HCl (VITAMIN B-6 PO) Take 1 tablet by mouth daily.    . ramipril (ALTACE) 5 MG capsule Take 1 capsule (5 mg total) by mouth daily with breakfast. 90 capsule 3  . ranitidine (ZANTAC) 150 MG tablet Take 1-2 tablets (150-300 mg total) by mouth daily as needed for heartburn. 30 tablet 0  . tiZANidine (ZANAFLEX) 4 MG capsule Take 4 mg by mouth 3 (three) times daily as needed for muscle spasms.    . Tofacitinib Citrate (XELJANZ) 5 MG TABS Take 5 mg by mouth 2 (two) times daily.     . traMADol (ULTRAM) 50 MG tablet Take 1 tablet (50 mg total) by mouth 4 (four) times daily. 120 tablet 0  . VOLTAREN 1 % GEL Apply 2 g topically 2 (two) times daily as needed (pain).   10  . ciclopirox (PENLAC) 8 % solution Apply topically at bedtime. Apply over nail and surrounding skin. Apply daily over previous coat. After seven (7) days, may remove with alcohol and continue cycle. (Patient not taking: Reported on 06/13/2015) 6.6 mL 0   No current facility-administered medications on file prior to visit.    Past Medical History  Diagnosis Date  . Neuromuscular disorder ([place])     Dr. Park Liter- in [place]- [facility] follows M.S[facility]  . Cancer ([place])     basal cell ca, in situ- uterine   . Multiple sclerosis ([place])   . Skin cancer   . Rheumatoid arthritis(714.0)     Dr Estanislado Pandy takes [name] daily  . OA (osteoarthritis)     Dr Jani Gravel weekly  . Hypertension     takes Ramipril daily  . Anxiety     takes Valium daily as needed  . Hyperlipidemia     takes Atorvastatin on Mondays and Fridays  . Cataracts, bilateral   . PONV (postoperative nausea and vomiting)     trouble urinating after surgery in 2014  . History of bronchitis 6-65yrs ago  . Headache(784.0)     r/t neck issues  . Dizziness     r/t to meds  . Chronic back pain   . Joint pain   . Bruises easily     d/t meds  . Joint swelling   . GERD (gastroesophageal reflux  disease)     no meds on a regular basis but will take Tums if needed  . History of colon polyps     benign  . Urinary retention     sees Dr.Wrenn about 2 times a yr  . Nocturia     Past Surgical History  Procedure Laterality Date  . Lumbar fusion      Dr Joya Salm  . Appendectomy      with TAH  . Cervical fusion      Dr  Botero  . Carpal tunnel release    . Chest tube insertion      for traumatic Pneumothorax  . Colonoscopy w/ polypectomy  1997    negative since; Dr Deatra Ina  . Thyroid surgery      R lobe removed- 1972  . Abdominal hysterectomy      1985  . Nasal sinus surgery    . Total hip arthroplasty  12/22/2011    Procedure: TOTAL HIP ARTHROPLASTY;  Surgeon: Kerin Salen, MD;  Location: [facility];  Service: Orthopedics;  Laterality: Left;  . Cervical fusion  12/31/2012    Dr Joya Salm  . Posterior cervical fusion/foraminotomy N/A 12/31/2012    Procedure: POSTERIOR LATERAL CERVICAL FUSION/FORAMINOTOMY LEVEL 1 CERVICAL THREE-FOUR WITH LATERAL MASS SCREWS;  Surgeon: Floyce Stakes, MD;  Location: [facility];  Service: Neurosurgery;  Laterality: N/A;  . Colonoscopy  07/16/2010    normal   . Esophagogastroduodenoscopy  07/16/2010    normal  . Skin biopsy    . Back surgery      several    Social History   Social History  . Marital Status: Married    Spouse Name: N/A  . Number of Children: N/A  . Years of Education: N/A   Occupational History  . retired    Social History Main Topics  . Smoking status: Former Smoker    Quit date: 05/26/1981  . Smokeless tobacco: None     Comment: Smoked [phone], up to 2.5 ppd  . Alcohol Use: 0.0 oz/week    0 Standard drinks or equivalent per week     Comment: 2-3 beers every day 2%  . Drug Use: No  . Sexual Activity: Yes   Other Topics Concern  . None   Social History Narrative    Family History  Problem Relation Age of Onset  . Cancer Mother     pancreatic  . Diabetes Mother   . Heart disease Father     Rheumatic  .  Cancer Father     ? stomach  . Cancer Sister     stomach  . Cancer Maternal Aunt     X 4; ? primary  . Heart disease Paternal Aunt   . Hypertension Neg Hx   . Stroke Neg Hx   . Cancer Maternal Grandmother     cervical  . Colon cancer      Aunts  . Multiple sclerosis Daughter     Review of Systems  Constitutional: Negative for fever and chills.  HENT: Positive for trouble swallowing (pills at times).   Respiratory: Negative for cough and wheezing.   Cardiovascular: Positive for palpitations (with anxiety only). Negative for chest pain and leg swelling.  Neurological: Positive for light-headedness (postural). Negative for headaches.       Objective:   Filed Vitals:   06/13/15 1348 06/13/15 1425  BP: 136/76 122/70  Pulse: 78   Temp: 97.8 F (36.6 C)   Resp: 16    Filed Weights   06/13/15 1348  Weight: 110 lb (49.896 kg)   Body mass index is 19.49 kg/(m^2).   Physical Exam Constitutional: Appears well-developed and well-nourished. No distress.  Neck: Neck supple. No tracheal deviation present. No thyromegaly present.  No carotid bruit. No cervical adenopathy.   Cardiovascular: Normal rate, regular rhythm and normal heart sounds.   No murmur heard.  No edema Pulmonary/Chest: Effort normal and breath sounds normal. No respiratory distress. No wheezes.  Abdomen: Soft, nontender, nondistended  Assessment & Plan:   See Problem List for Assessment and Plan of chronic medical problems.  Follow-up annually and as needed

## 2015-06-13 NOTE — Assessment & Plan Note (Signed)
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

## 2015-06-13 NOTE — Assessment & Plan Note (Signed)
Following with neurology Treatment per neuro

## 2015-06-13 NOTE — Progress Notes (Signed)
Pre visit review using our clinic review tool, if applicable. No additional management support is needed unless otherwise documented below in the visit note. 

## 2015-06-13 NOTE — Assessment & Plan Note (Signed)
BP Readings from Last 3 Encounters:  06/13/15 122/70  05/07/15 110/60  04/05/15 112/72   Controlled Continue current medication

## 2015-06-13 NOTE — Assessment & Plan Note (Signed)
Check TSH Check thyroid ultrasound

## 2015-06-13 NOTE — Patient Instructions (Addendum)
  We ordered a thyroid ultrasound.   Test(s) ordered today. Your results will be released to [place] (or called to you) after review, usually within 72hours after test completion. If any changes need to be made, you will be notified at that same time.  All other Health Maintenance issues reviewed.   All recommended immunizations and age-appropriate screenings are up-to-date.  No immunizations administered today.   Medications reviewed and updated.  No changes recommended at this time.  Follow up annually

## 2015-06-13 NOTE — Assessment & Plan Note (Signed)
Following with rheumatology Taking prednisone daily Takes oxycodone and night Tries to exercise regularly

## 2015-06-15 ENCOUNTER — Other Ambulatory Visit (INDEPENDENT_AMBULATORY_CARE_PROVIDER_SITE_OTHER): Payer: Medicare Other

## 2015-06-15 DIAGNOSIS — E785 Hyperlipidemia, unspecified: Secondary | ICD-10-CM

## 2015-06-15 DIAGNOSIS — Z8639 Personal history of other endocrine, nutritional and metabolic disease: Secondary | ICD-10-CM

## 2015-06-15 DIAGNOSIS — I10 Essential (primary) hypertension: Secondary | ICD-10-CM | POA: Diagnosis not present

## 2015-06-15 LAB — COMPREHENSIVE METABOLIC PANEL
ALT: 14 U/L (ref 0–35)
AST: 21 U/L (ref 0–37)
Albumin: 4.1 g/dL (ref 3.5–5.2)
Alkaline Phosphatase: 31 U/L — ABNORMAL LOW (ref 39–117)
BILIRUBIN TOTAL: 0.5 mg/dL (ref 0.2–1.2)
BUN: 9 mg/dL (ref 6–23)
CALCIUM: 9.4 mg/dL (ref 8.4–10.5)
CHLORIDE: 100 meq/L (ref 96–112)
CO2: 31 meq/L (ref 19–32)
Creatinine, Ser: 0.88 mg/dL (ref 0.40–1.20)
GFR: 66.89 mL/min (ref >60.00)
GLUCOSE: 80 mg/dL (ref 70–99)
Potassium: 4 mEq/L (ref 3.5–5.1)
Sodium: 139 mEq/L (ref 135–145)
Total Protein: 6.7 g/dL (ref 6.0–8.3)

## 2015-06-15 LAB — CBC WITH DIFFERENTIAL/PLATELET
BASOS PCT: 0.4 % (ref 0.0–3.0)
Basophils Absolute: 0 10*3/uL (ref 0.0–0.1)
EOS PCT: 1 % (ref 0.0–5.0)
Eosinophils Absolute: 0.1 10*3/uL (ref 0.0–0.7)
HCT: 41.4 % (ref 36.0–46.0)
HEMOGLOBIN: 13.8 g/dL (ref 12.0–15.0)
LYMPHS ABS: 2 10*3/uL (ref 0.7–4.0)
Lymphocytes Relative: 32.6 % (ref 12.0–46.0)
MCHC: 33.4 g/dL (ref 30.0–36.0)
MCV: 98.3 fl (ref 78.0–100.0)
MONO ABS: 0.6 10*3/uL (ref 0.1–1.0)
MONOS PCT: 10 % (ref 3.0–12.0)
Neutro Abs: 3.4 10*3/uL (ref 1.4–7.7)
Neutrophils Relative %: 56 % (ref 43.0–77.0)
Platelets: 358 10*3/uL (ref 150.0–400.0)
RBC: 4.21 Mil/uL (ref 3.87–5.11)
RDW: 15 % (ref 11.5–15.5)
WBC: 6 10*3/uL (ref 4.0–10.5)

## 2015-06-15 LAB — LIPID PANEL
CHOLESTEROL: 213 mg/dL — AB (ref 0–200)
HDL: 85.3 mg/dL (ref >39.00)
LDL CALC: 104 mg/dL — AB (ref 0–99)
NONHDL: 128.09
Total CHOL/HDL Ratio: 3
Triglycerides: 118 mg/dL (ref 0.0–149.0)
VLDL: 23.6 mg/dL (ref 0.0–40.0)

## 2015-06-15 LAB — TSH: TSH: 3.94 u[IU]/mL (ref 0.35–4.50)

## 2015-06-17 ENCOUNTER — Encounter: Payer: Self-pay | Admitting: Internal Medicine

## 2015-06-19 ENCOUNTER — Ambulatory Visit
Admission: RE | Admit: 2015-06-19 | Discharge: 2015-06-19 | Disposition: A | Discharge status: Home / Self Care | Payer: Medicare Other | Source: Ambulatory Visit | Attending: Internal Medicine | Admitting: Internal Medicine

## 2015-06-19 DIAGNOSIS — Z8639 Personal history of other endocrine, nutritional and metabolic disease: Secondary | ICD-10-CM

## 2015-06-19 DIAGNOSIS — R1310 Dysphagia, unspecified: Secondary | ICD-10-CM

## 2015-06-20 ENCOUNTER — Encounter: Payer: Self-pay | Admitting: Internal Medicine

## 2015-06-27 ENCOUNTER — Other Ambulatory Visit: Payer: Self-pay | Admitting: Internal Medicine

## 2015-08-07 DIAGNOSIS — M19241 Secondary osteoarthritis, right hand: Secondary | ICD-10-CM | POA: Diagnosis not present

## 2015-08-07 DIAGNOSIS — Z09 Encounter for follow-up examination after completed treatment for conditions other than malignant neoplasm: Secondary | ICD-10-CM | POA: Diagnosis not present

## 2015-08-07 DIAGNOSIS — M5030 Other cervical disc degeneration, unspecified cervical region: Secondary | ICD-10-CM | POA: Diagnosis not present

## 2015-08-07 DIAGNOSIS — M0579 Rheumatoid arthritis with rheumatoid factor of multiple sites without organ or systems involvement: Secondary | ICD-10-CM | POA: Diagnosis not present

## 2015-08-21 ENCOUNTER — Telehealth: Payer: Self-pay | Admitting: Neurology

## 2015-08-21 MED ORDER — OXYCODONE HCL 10 MG PO TABS: 10.0000 mg | ORAL_TABLET | Freq: Two times a day (BID) | ORAL | Status: DC | PRN

## 2015-08-21 NOTE — Telephone Encounter (Signed)
Pt requesting refill for Oxycodone HCl 10 MG TABS .

## 2015-08-21 NOTE — Telephone Encounter (Signed)
Rx. awaiting RAS sig/fim 

## 2015-08-21 NOTE — Telephone Encounter (Signed)
Oxycodone rx. up front [facility]/fim 

## 2015-08-23 ENCOUNTER — Other Ambulatory Visit: Payer: Self-pay | Admitting: Neurology

## 2015-08-24 ENCOUNTER — Other Ambulatory Visit: Payer: Self-pay | Admitting: Internal Medicine

## 2015-08-27 DIAGNOSIS — Z79899 Other long term (current) drug therapy: Secondary | ICD-10-CM | POA: Diagnosis not present

## 2015-08-27 DIAGNOSIS — M2550 Pain in unspecified joint: Secondary | ICD-10-CM | POA: Diagnosis not present

## 2015-08-29 ENCOUNTER — Other Ambulatory Visit (HOSPITAL_COMMUNITY): Payer: Self-pay | Admitting: *Deleted

## 2015-08-30 ENCOUNTER — Ambulatory Visit (HOSPITAL_COMMUNITY)
Admission: RE | Admit: 2015-08-30 | Discharge: 2015-08-30 | Disposition: A | Discharge status: Home / Self Care | Payer: Medicare Other | Source: Ambulatory Visit | Attending: Rheumatology | Admitting: Rheumatology

## 2015-08-30 DIAGNOSIS — M810 Age-related osteoporosis without current pathological fracture: Secondary | ICD-10-CM | POA: Insufficient documentation

## 2015-08-30 MED ORDER — ZOLEDRONIC ACID 5 MG/100ML IV SOLN
5.0000 mg | Freq: Once | INTRAVENOUS | Status: AC
  Administered 2015-08-30: 5 mg via INTRAVENOUS

## 2015-08-30 MED ORDER — ZOLEDRONIC ACID 5 MG/100ML IV SOLN
INTRAVENOUS | Status: AC
  Administered 2015-08-30: 5 mg via INTRAVENOUS
  Filled 2015-08-30: qty 100

## 2015-09-24 ENCOUNTER — Encounter: Payer: Self-pay | Admitting: Neurology

## 2015-09-24 ENCOUNTER — Ambulatory Visit (INDEPENDENT_AMBULATORY_CARE_PROVIDER_SITE_OTHER): Payer: Medicare Other | Admitting: Neurology

## 2015-09-24 VITALS — BP 110/62 | HR 68 | Resp 14 | Ht 64.0 in | Wt 113.0 lb

## 2015-09-24 DIAGNOSIS — M069 Rheumatoid arthritis, unspecified: Secondary | ICD-10-CM

## 2015-09-24 DIAGNOSIS — G4700 Insomnia, unspecified: Secondary | ICD-10-CM | POA: Diagnosis not present

## 2015-09-24 DIAGNOSIS — R5382 Chronic fatigue, unspecified: Secondary | ICD-10-CM | POA: Diagnosis not present

## 2015-09-24 DIAGNOSIS — F418 Other specified anxiety disorders: Secondary | ICD-10-CM

## 2015-09-24 DIAGNOSIS — M4312 Spondylolisthesis, cervical region: Secondary | ICD-10-CM | POA: Diagnosis not present

## 2015-09-24 DIAGNOSIS — G35D Multiple sclerosis, unspecified: Secondary | ICD-10-CM

## 2015-09-24 DIAGNOSIS — R269 Unspecified abnormalities of gait and mobility: Secondary | ICD-10-CM | POA: Diagnosis not present

## 2015-09-24 DIAGNOSIS — G35 Multiple sclerosis: Secondary | ICD-10-CM

## 2015-09-24 MED ORDER — OXYCODONE HCL 10 MG PO TABS: 10.0000 mg | ORAL_TABLET | Freq: Three times a day (TID) | ORAL | Status: DC | PRN

## 2015-09-24 MED ORDER — DIAZEPAM 5 MG PO TABS: 5.0000 mg | ORAL_TABLET | Freq: Three times a day (TID) | ORAL | Status: DC | PRN

## 2015-09-24 NOTE — Progress Notes (Signed)
[facility]  PATIENT: Sherri [name] DOB: [birth year]  REFERRING CLINICIAN: Unice Cobble  HISTORY FROM: patient REASON FOR VISIT: Multiple sclerosis   HISTORICAL  CHIEF COMPLAINT:  Chief Complaint  Patient presents with  . Multiple Sclerosis    Sts. tingling/numbness in feet is worse--milder in the morning and worse as the day goes on./fim    HISTORY OF PRESENT ILLNESS:  Sherri [name] is a 75 year old woman who was diagnosed with relapsing remitting MS in 2010.   For the most part, her MS is stable, though she has more numbness in her lower legs and feels she drags her feet more.    She also has RA and is on Xeljanz and prednisone.    She is not on any DMT for her MS   She denies any recent exacerbation.     Gait/strength:    She has mild gait disturbance and drags both feet now (was just right) foot when she is tired  and feels off balanced at times.  She stumbles/trips but has not had any falls.    She does not feel she needs a cane.    Sensation:   She is noting more lower leg numbness.,    Her feet tingle more as the day goes on, especially after she lays down at bedtime.   She feels the facial numbness is the same involving the entire left side of the face in V2 and V3 > V1.    No arm numbness.     Bladder:   She has urinary frequency for many years. U/S shows she does not completely empty.   She got a 'squatting potty' at her urologist's request and empties better.  She has had UTI's in the past.     Vision:   No MS related vision problems.     Fatigue/sleep:     Fatigue is doing ok most days though some days she feels generally weak.     She is sleeping better with less stress She is taking  Valium 5 mg and Oxycodone 10 mg at bedtime and sleeps better with those med's.   Pain was affecting her sleep   She is active, walking daily.    Mood/cognition:     She notes mood (depression) is better.   Her husband had GBS in 2006 and is getting more medical  issues.   She does not note that she has any significant cognitive changes.      Right Biceps:   She had surgery by Dr. Veverly Fells for the ruptured biceps tendon and is doing well with improved function and reduced pain.     MS History:   When she was in her late 57s she had an episode of right-sided numbness and she lost the use of her right arm for a couple of weeks. At that time, she was told that she might have MS. Over the next 30 or 40 years she had several more similar but milder episodes. In 2010, she had an MRI of the cervical spine (for arm pain) and it showed foci c/w multiple sclerosis. An MRI of the brain was also performed  shortly thereafter showing white matter lesions consistent with the diagnosis.  Initially, she was placed on Avonex. She also has rheumatoid arthritis and was placed on methotrexate and then a combination of methotrexate with [name]. We decided to stop the Avonex as she probably would get some benefit from those medications for the MS as well. For the most  part her MS has been stable over the past 5 years.However, early 2016,  she had numbness in the left face and weakness in her legs   REVIEW OF SYSTEMS:  Constitutional: No fevers, chills, sweats, or change in appetite Eyes: No visual changes, double vision, eye pain Ear, nose and throat: No hearing loss, ear pain, nasal congestion, sore throat Cardiovascular: No chest pain, palpitations Respiratory:  No shortness of breath at rest or with exertion.   No wheezes GastrointestinaI: No nausea, vomiting, diarrhea, abdominal pain, fecal incontinence Genitourinary:  as above. Musculoskeletal:  No neck pain, back pain Integumentary: No rash, pruritus, skin lesions Neurological: as above Psychiatric: he has had a lot of family stress leading to some depression and more anxiety. Endocrine: No palpitations, diaphoresis, change in appetite, change in weigh or increased thirst Hematologic/Lymphatic:  No anemia, purpura,  petechiae. Allergic/Immunologic: No itchy/runny eyes, nasal congestion, recent allergic reactions, rashes  ALLERGIES: Allergies  Allergen Reactions  . Meperidine Hcl Shortness Of Breath  . Penicillins Hives  . Azathioprine Other (See Comments)    Weakness  . Darvon [Propoxyphene Hcl]     Lowered respirations greatly  . Ezetimibe-Simvastatin Nausea And Vomiting    HOME MEDICATIONS: Outpatient Prescriptions Prior to Visit  Medication Sig Dispense Refill  . atorvastatin (LIPITOR) 10 MG tablet TAKE 1 TABLET BY MOUTH ON MONDAYS AND FRIDAYS 30 tablet 2  . Biotin 5000 MCG TABS Take 5,000 mcg by mouth daily.     . Calcium Carbonate-Vitamin D (CALCIUM + D PO) Take 1 tablet by mouth daily. 1200 mg daily    . ciclopirox (PENLAC) 8 % solution Apply topically at bedtime. Apply over nail and surrounding skin. Apply daily over previous coat. After seven (7) days, may remove with alcohol and continue cycle. 6.6 mL 0  . Cyanocobalamin (VITAMIN B-12 PO) Take 1 tablet by mouth daily.    [name] denosumab (PROLIA) 60 MG/ML SOLN injection Inject 60 mg into the skin every 6 (six) months. Administer in upper arm, thigh, or abdomen    . diazepam (VALIUM) 5 MG tablet TAKE 1 TABLET BY MOUTH 3 TIMES A DAY AS NEEDED FOR ANXIETY 90 tablet 0  . folic acid (FOLVITE) 1 MG tablet Take 1 mg by mouth 2 (two) times daily.    . furosemide (LASIX) 20 MG tablet TAKE 1 TABLET BY MOUTH EVERY DAY 90 tablet 1  . Omega-3 Fatty Acids (OMEGA 3 PO) Take 1,000 mg by mouth daily.     . Oxycodone HCl 10 MG TABS Take 1 tablet (10 mg total) by mouth 2 (two) times daily as needed. 60 tablet 0  . predniSONE (DELTASONE) 5 MG tablet Take 5 mg by mouth daily.     . Pyridoxine HCl (VITAMIN B-6 PO) Take 1 tablet by mouth daily.    . ramipril (ALTACE) 5 MG capsule Take 1 capsule (5 mg total) by mouth daily with breakfast. 90 capsule 3  . ranitidine (ZANTAC) 150 MG tablet Take 1-2 tablets (150-300 mg total) by mouth daily as needed for heartburn. 30  tablet 0  . tiZANidine (ZANAFLEX) 4 MG capsule Take 4 mg by mouth 3 (three) times daily as needed for muscle spasms.    . Tofacitinib Citrate (XELJANZ) 5 MG TABS Take 5 mg by mouth 2 (two) times daily.     . traMADol (ULTRAM) 50 MG tablet Take 1 tablet (50 mg total) by mouth 4 (four) times daily. 120 tablet 0  . dicyclomine (BENTYL) 10 MG capsule Take 1-2 capsules (10-20  mg total) by mouth 3 (three) times daily as needed for spasms. 30 capsule 0  . VOLTAREN 1 % GEL Apply 2 g topically 2 (two) times daily as needed (pain).   10   No facility-administered medications prior to visit.    PAST MEDICAL HISTORY: Past Medical History  Diagnosis Date  . Neuromuscular disorder ([place])     Dr. Park Liter- in [place]- [facility] follows M.S.  . Cancer ([place])     basal cell ca, in situ- uterine   . Multiple sclerosis ([place])   . Skin cancer   . Rheumatoid arthritis(714.0)     Dr Estanislado Pandy takes [name] daily  . OA (osteoarthritis)     Dr Jani Gravel weekly  . Hypertension     takes Ramipril daily  . Anxiety     takes Valium daily as needed  . Hyperlipidemia     takes Atorvastatin on Mondays and Fridays  . Cataracts, bilateral   . PONV (postoperative nausea and vomiting)     trouble urinating after surgery in 2014  . History of bronchitis 6-21yrs ago  . Headache(784.0)     r/t neck issues  . Dizziness     r/t to meds  . Chronic back pain   . Joint pain   . Bruises easily     d/t meds  . Joint swelling   . GERD (gastroesophageal reflux disease)     no meds on a regular basis but will take Tums if needed  . History of colon polyps     benign  . Urinary retention     sees Dr.Wrenn about 2 times a yr  . Nocturia     PAST SURGICAL HISTORY: Past Surgical History  Procedure Laterality Date  . Lumbar fusion      Dr Joya Salm  . Appendectomy      with TAH  . Cervical fusion      Dr Joya Salm  . Carpal tunnel release    . Chest tube insertion      for traumatic Pneumothorax  . Colonoscopy  w/ polypectomy  1997    negative since; Dr Deatra Ina  . Thyroid surgery      R lobe removed- 1972  . Abdominal hysterectomy      1985  . Nasal sinus surgery    . Total hip arthroplasty  12/22/2011    Procedure: TOTAL HIP ARTHROPLASTY;  Surgeon: Kerin Salen, MD;  Location: [facility];  Service: Orthopedics;  Laterality: Left;  . Cervical fusion  12/31/2012    Dr Joya Salm  . Posterior cervical fusion/foraminotomy N/A 12/31/2012    Procedure: POSTERIOR LATERAL CERVICAL FUSION/FORAMINOTOMY LEVEL 1 CERVICAL THREE-FOUR WITH LATERAL MASS SCREWS;  Surgeon: Floyce Stakes, MD;  Location: [facility];  Service: Neurosurgery;  Laterality: N/A;  . Colonoscopy  07/16/2010    normal   . Esophagogastroduodenoscopy  07/16/2010    normal  . Skin biopsy    . Back surgery      several    FAMILY HISTORY: Family History  Problem Relation Age of Onset  . Cancer Mother     pancreatic  . Diabetes Mother   . Heart disease Father     Rheumatic  . Cancer Father     ? stomach  . Cancer Sister     stomach  . Cancer Maternal Aunt     X 4; ? primary  . Heart disease Paternal Aunt   . Hypertension Neg Hx   . Stroke Neg Hx   .  Cancer Maternal Grandmother     cervical  . Colon cancer      Aunts  . Multiple sclerosis Daughter     SOCIAL HISTORY:  Social History   Social History  . Marital Status: Married    Spouse Name: N/A  . Number of Children: N/A  . Years of Education: N/A   Occupational History  . retired    Social History Main Topics  . Smoking status: Former Smoker    Quit date: 05/26/1981  . Smokeless tobacco: Not on file     Comment: Smoked [phone], up to 2.5 ppd  . Alcohol Use: 0.0 oz/week    0 Standard drinks or equivalent per week     Comment: 2-3 beers every day 2%  . Drug Use: No  . Sexual Activity: Yes   Other Topics Concern  . Not on file   Social History Narrative     PHYSICAL EXAM  Filed Vitals:   09/24/15 1328  BP: 110/62  Pulse: 68  Resp: 14  Height: 5\' 4"   (1.626 m)  Weight: 113 lb (51.256 kg)    Body mass index is 19.39 kg/(m^2).   General: The patient is well-developed and well-nourished and in no acute distress  Musculoskeletal:   Right biceps bulge from ruptured tendon.    Skin: Extremities are without significant edema.  Neurologic Exam  Mental status: The patient is alert and oriented x 3 at the time of the examination. The patient has apparent normal recent and remote memory, with an apparently normal attention span and concentration ability.   Speech is normal.  CN:  EOMI, reduced facial sensation to soft touch left V2 and V3 .Facial strength is normal.  Trapezius and sternocleidomastoid strength is normal. No dysarthria is noted.   No obvious hearing deficits are noted.  Motor:  Muscle bulk is normal and tone is mildly increased in her legs, left > right. Strength appears 5 / 5 in all 4 extremities.   (did not test proximal right arm due to biceps injury)  Sensory: Sensory testing is intact to touch in all 4 extremities.  Coordination: Cerebellar testing reveals good rapid alternating movements in hands and feet  Gait and station: Station is stable with the eyes open and mild wobbling with eyes closed. Gait was mildly wide. She has moderately wide tandem walk.  Romberg is negative.   Reflexes: Deep tendon reflexes are symmetric and normal in legs.     DIAGNOSTIC DATA (LABS, IMAGING, TESTING) - I reviewed patient records, labs, notes, testing and imaging myself where available.  Lab Results  Component Value Date   WBC 6.0 06/15/2015   HGB 13.8 06/15/2015   HCT 41.4 06/15/2015   MCV 98.3 06/15/2015   PLT 358.0 06/15/2015      Component Value Date/Time   NA 139 06/15/2015 0953   NA 136* 02/22/2015   NA 136 02/22/2015   K 4.0 06/15/2015 0953   K 4.4 02/22/2015   CL 100 06/15/2015 0953   CL 98 02/22/2015   CO2 31 06/15/2015 0953   CO2 30 02/22/2015   GLUCOSE 80 06/15/2015 0953   BUN 9 06/15/2015 0953   BUN 15  02/22/2015   BUN 15 02/22/2015   CREATININE 0.88 06/15/2015 0953   CREATININE 0.9 02/22/2015   CREATININE 0.93 02/22/2015   CALCIUM 9.4 06/15/2015 0953   CALCIUM 9.6 02/22/2015   PROT 6.7 06/15/2015 0953   PROT 6.6 02/22/2015   ALBUMIN 4.1 06/15/2015 0953   ALBUMIN 4.3  02/22/2015   AST 21 06/15/2015 0953   AST 24 02/22/2015   ALT 14 06/15/2015 0953   ALT 18 02/22/2015   ALKPHOS 31* 06/15/2015 0953   ALKPHOS 34 02/22/2015   BILITOT 0.5 06/15/2015 0953   BILITOT 0.5 02/22/2015   GFRNONAA >60 01/04/2015 1327   GFRAA >60 01/04/2015 1327   Lab Results  Component Value Date   CHOL 213* 06/15/2015   HDL 85.30 06/15/2015   LDLCALC 104* 06/15/2015   LDLDIRECT 122.9 11/15/2012   TRIG 118.0 06/15/2015   CHOLHDL 3 06/15/2015   Lab Results  Component Value Date   HGBA1C 5.6 10/24/2013   No results found for: VITAMINB12 Lab Results  Component Value Date   TSH 3.94 06/15/2015        ASSESSMENT AND PLAN  Multiple sclerosis (HCC)  Rheumatoid arthritis, involving unspecified site, unspecified rheumatoid factor presence (HCC)  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  Gait disturbance  Depression with anxiety  Chronic fatigue  Insomnia    1.   She will continue on Xeljanz and prednisone for RA .   She sees Dr. Estanislado Pandy.   If she has another relapse or if she does not do well with her RA, rituximab could be a good choice with efficacy for both diseases. 2.   Continue the oxycodone and valium at night to help her sleep 3.   Continue tramadol during the day.   . 4.    return to see [place] in 5-6 months or sooner if she has new or worsening neurologic symptoms.    Gwynevere Lizana A. Felecia Shelling, MD, PhD [id number], [id number] PM Certified in Neurology, Clinical Neurophysiology, Sleep Medicine, Pain Medicine and Neuroimaging  [facility] [address], [place], [place] [address] [phone]

## 2015-10-18 DIAGNOSIS — Z1231 Encounter for screening mammogram for malignant neoplasm of breast: Secondary | ICD-10-CM | POA: Diagnosis not present

## 2015-10-18 DIAGNOSIS — Z01419 Encounter for gynecological examination (general) (routine) without abnormal findings: Secondary | ICD-10-CM | POA: Diagnosis not present

## 2015-10-18 DIAGNOSIS — Z6820 Body mass index (BMI) 20.0-20.9, adult: Secondary | ICD-10-CM | POA: Diagnosis not present

## 2015-11-08 ENCOUNTER — Other Ambulatory Visit: Payer: Self-pay | Admitting: *Deleted

## 2015-11-08 ENCOUNTER — Other Ambulatory Visit: Payer: Self-pay | Admitting: Neurology

## 2015-11-08 MED ORDER — RAMIPRIL 5 MG PO CAPS: 5.0000 mg | ORAL_CAPSULE | Freq: Every day | ORAL | Status: DC

## 2015-11-09 ENCOUNTER — Other Ambulatory Visit: Payer: Self-pay | Admitting: *Deleted

## 2015-11-09 MED ORDER — RAMIPRIL 5 MG PO CAPS: 5.0000 mg | ORAL_CAPSULE | Freq: Every day | ORAL | Status: DC

## 2015-11-19 ENCOUNTER — Telehealth: Payer: Self-pay | Admitting: Neurology

## 2015-11-19 NOTE — Telephone Encounter (Signed)
I have spoken with [name]. She c/o increasing arthritic pain, difficulty treating this.  Sts. she wonders if she might not truly have MS--wonders if she needs LP to confirm. Will check with RAS when he returns to the office tomorrow and call her back/fim

## 2015-11-19 NOTE — Telephone Encounter (Signed)
Pt called requesting Faith to call. Pt did not want to go into detail

## 2015-11-20 NOTE — Telephone Encounter (Signed)
I have spoken with [name] this morning, and advised that RAS has reviewed her mri results, sts. they are definitely consistent with dx. of MS.  She verbalized understanding of same/fim

## 2015-11-23 ENCOUNTER — Encounter: Payer: Self-pay | Admitting: Internal Medicine

## 2015-12-03 DIAGNOSIS — Z79899 Other long term (current) drug therapy: Secondary | ICD-10-CM | POA: Diagnosis not present

## 2015-12-04 DIAGNOSIS — Z09 Encounter for follow-up examination after completed treatment for conditions other than malignant neoplasm: Secondary | ICD-10-CM | POA: Diagnosis not present

## 2015-12-04 DIAGNOSIS — M810 Age-related osteoporosis without current pathological fracture: Secondary | ICD-10-CM | POA: Diagnosis not present

## 2015-12-04 DIAGNOSIS — M0579 Rheumatoid arthritis with rheumatoid factor of multiple sites without organ or systems involvement: Secondary | ICD-10-CM | POA: Diagnosis not present

## 2015-12-04 DIAGNOSIS — M19241 Secondary osteoarthritis, right hand: Secondary | ICD-10-CM | POA: Diagnosis not present

## 2016-01-29 ENCOUNTER — Telehealth: Payer: Self-pay | Admitting: Neurology

## 2016-01-29 DIAGNOSIS — G894 Chronic pain syndrome: Secondary | ICD-10-CM

## 2016-01-29 MED ORDER — OXYCODONE HCL 10 MG PO TABS: 10.0000 mg | ORAL_TABLET | Freq: Three times a day (TID) | ORAL | 0 refills | Status: DC | PRN

## 2016-01-29 MED ORDER — DIAZEPAM 5 MG PO TABS: 5.0000 mg | ORAL_TABLET | Freq: Three times a day (TID) | ORAL | 0 refills | Status: DC | PRN

## 2016-01-29 MED ORDER — TRAMADOL HCL 50 MG PO TABS: 50.0000 mg | ORAL_TABLET | Freq: Four times a day (QID) | ORAL | 0 refills | Status: DC

## 2016-01-29 NOTE — Telephone Encounter (Signed)
Rx's awaiting RAS sig/fim 

## 2016-01-29 NOTE — Telephone Encounter (Signed)
Pt request refill for Oxycodone HCl 10 MG TABS and diazepam (VALIUM) 5 MG tablet and traMADol (ULTRAM) 50 MG tablet

## 2016-01-30 NOTE — Telephone Encounter (Signed)
Oxycodone, Valium and Tramadol rx's up front [facility].  UDS ordered/fim

## 2016-02-15 ENCOUNTER — Other Ambulatory Visit: Payer: Self-pay | Admitting: Internal Medicine

## 2016-02-25 ENCOUNTER — Encounter: Payer: Self-pay | Admitting: Neurology

## 2016-02-25 ENCOUNTER — Ambulatory Visit (INDEPENDENT_AMBULATORY_CARE_PROVIDER_SITE_OTHER): Payer: Medicare Other | Admitting: Neurology

## 2016-02-25 VITALS — BP 116/78 | HR 74 | Resp 14 | Ht 64.0 in | Wt 113.5 lb

## 2016-02-25 DIAGNOSIS — M069 Rheumatoid arthritis, unspecified: Secondary | ICD-10-CM | POA: Diagnosis not present

## 2016-02-25 DIAGNOSIS — G4700 Insomnia, unspecified: Secondary | ICD-10-CM | POA: Diagnosis not present

## 2016-02-25 DIAGNOSIS — M25561 Pain in right knee: Secondary | ICD-10-CM | POA: Insufficient documentation

## 2016-02-25 DIAGNOSIS — G35 Multiple sclerosis: Secondary | ICD-10-CM

## 2016-02-25 DIAGNOSIS — R200 Anesthesia of skin: Secondary | ICD-10-CM

## 2016-02-25 DIAGNOSIS — M4312 Spondylolisthesis, cervical region: Secondary | ICD-10-CM | POA: Diagnosis not present

## 2016-02-25 DIAGNOSIS — R269 Unspecified abnormalities of gait and mobility: Secondary | ICD-10-CM | POA: Diagnosis not present

## 2016-02-25 MED ORDER — DIAZEPAM 5 MG PO TABS: 5.0000 mg | ORAL_TABLET | Freq: Three times a day (TID) | ORAL | 0 refills | Status: DC | PRN

## 2016-02-25 MED ORDER — OXYCODONE HCL 10 MG PO TABS: 10.0000 mg | ORAL_TABLET | Freq: Three times a day (TID) | ORAL | 0 refills | Status: DC | PRN

## 2016-02-25 NOTE — Progress Notes (Signed)
[facility]  PATIENT: Sherri [name] DOB: [birth year]  REFERRING CLINICIAN: Unice Cobble  HISTORY FROM: patient REASON FOR VISIT: Multiple sclerosis   HISTORICAL  CHIEF COMPLAINT:  Chief Complaint  Patient presents with  . Multiple Sclerosis    She is not on any MS med (takes [place] for RA).  Today she c/o more right knee pain--has been working more at home--up and down steps at home.  She would like to discuss an inj. if appropriate/fim    HISTORY OF PRESENT ILLNESS:  Sherri [name] is a 75 year old woman who was diagnosed with relapsing remitting MS in 2010.   For the most part, her MS is stable, though she has more numbness in her lower legs and feels she drags her feet more.    She also has RA and is on Xeljanz and prednisone.    She is not on any DMT for her MS   She denies any recent exacerbation.     Gait/strength:    She has mild gait disturbance and drags right foot at times causing her to stumble.  At times, she feels off balanced at times.     She does not feel she needs a cane.    Sensation:   She has mild numbness in the lower leg, left = right in the feet mostly.    She has left facial numbness   in V2 and V3 > V1.    No arm numbness.     Bladder:   She has urinary frequency for many years. U/S shows she does not completely empty.      She has no recent UTI's,      Vision:   No MS related vision problems.     Fatigue/sleep:     Fatigue is mild most days but worse a day after 'overdoing it'.        She is sleeping well with Valium 5 mg and Oxycodone 10 mg at bedtime .   Pain was affecting her sleep   She is active, walking daily.    Mood/cognition:     She notes mood is dping ok.   She has had some family issues with her daughter who also has MS    She does not note that she has any significant cognitive changes.      Right Knee pain:   She repports more knee pain in the iner right knee.    Pain increases with using stairs.     MS History:    When she was in her late 26s she had an episode of right-sided numbness and she lost the use of her right arm for a couple of weeks. At that time, she was told that she might have MS. Over the next 30 or 40 years she had several more similar but milder episodes. In 2010, she had an MRI of the cervical spine (for arm pain) and it showed foci c/w multiple sclerosis. An MRI of the brain was also performed  shortly thereafter showing white matter lesions consistent with the diagnosis.  Initially, she was placed on Avonex. She also has rheumatoid arthritis and was placed on methotrexate and then a combination of methotrexate with [name]. We decided to stop the Avonex as she probably would get some benefit from those medications for the MS as well. For the most part her MS has been stable over the past 5 years.However, early 2016,  she had numbness in the left face and weakness in her  legs   REVIEW OF SYSTEMS:  Constitutional: No fevers, chills, sweats, or change in appetite Eyes: No visual changes, double vision, eye pain Ear, nose and throat: No hearing loss, ear pain, nasal congestion, sore throat Cardiovascular: No chest pain, palpitations Respiratory:  No shortness of breath at rest or with exertion.   No wheezes GastrointestinaI: No nausea, vomiting, diarrhea, abdominal pain, fecal incontinence Genitourinary:  as above. Musculoskeletal:  No neck pain, back pain Integumentary: No rash, pruritus, skin lesions Neurological: as above Psychiatric: he has had a lot of family stress leading to some depression and more anxiety. Endocrine: No palpitations, diaphoresis, change in appetite, change in weigh or increased thirst Hematologic/Lymphatic:  No anemia, purpura, petechiae. Allergic/Immunologic: No itchy/runny eyes, nasal congestion, recent allergic reactions, rashes  ALLERGIES: Allergies  Allergen Reactions  . Meperidine Hcl Shortness Of Breath  . Penicillins Hives  . Azathioprine Other (See  Comments)    Weakness  . Darvon [Propoxyphene Hcl]     Lowered respirations greatly  . Ezetimibe-Simvastatin Nausea And Vomiting    HOME MEDICATIONS: Outpatient Medications Prior to Visit  Medication Sig Dispense Refill  . atorvastatin (LIPITOR) 10 MG tablet TAKE 1 TABLET BY MOUTH ON MONDAYS AND FRIDAYS 30 tablet 2  . Biotin 5000 MCG TABS Take 5,000 mcg by mouth daily.     . Calcium Carbonate-Vitamin D (CALCIUM + D PO) Take 1 tablet by mouth daily. 1200 mg daily    . ciclopirox (PENLAC) 8 % solution Apply topically at bedtime. Apply over nail and surrounding skin. Apply daily over previous coat. After seven (7) days, may remove with alcohol and continue cycle. 6.6 mL 0  . Cyanocobalamin (VITAMIN B-12 PO) Take 1 tablet by mouth daily.    . folic acid (FOLVITE) 1 MG tablet Take 1 mg by mouth 2 (two) times daily.    . furosemide (LASIX) 20 MG tablet TAKE 1 TABLET BY MOUTH EVERY DAY 90 tablet 1  . Omega-3 Fatty Acids (OMEGA 3 PO) Take 1,000 mg by mouth daily.     . predniSONE (DELTASONE) 5 MG tablet Take 5 mg by mouth daily.     . Pyridoxine HCl (VITAMIN B-6 PO) Take 1 tablet by mouth daily.    . ramipril (ALTACE) 5 MG capsule Take 1 capsule (5 mg total) by mouth daily with breakfast. 90 capsule 2  . ranitidine (ZANTAC) 150 MG tablet Take 1-2 tablets (150-300 mg total) by mouth daily as needed for heartburn. 30 tablet 0  . tiZANidine (ZANAFLEX) 4 MG capsule Take 4 mg by mouth 3 (three) times daily as needed for muscle spasms.    . Tofacitinib Citrate (XELJANZ) 5 MG TABS Take 5 mg by mouth 2 (two) times daily.     . traMADol (ULTRAM) 50 MG tablet Take 1 tablet (50 mg total) by mouth 4 (four) times daily. 120 tablet 0  . diazepam (VALIUM) 5 MG tablet Take 1 tablet (5 mg total) by mouth every 8 (eight) hours as needed for anxiety. 90 tablet 0  . Oxycodone HCl 10 MG TABS Take 1 tablet (10 mg total) by mouth 3 (three) times daily as needed. 90 tablet 0  . denosumab (PROLIA) 60 MG/ML SOLN injection  Inject 60 mg into the skin every 6 (six) months. Administer in upper arm, thigh, or abdomen     No facility-administered medications prior to visit.     PAST MEDICAL HISTORY: Past Medical History:  Diagnosis Date  . Anxiety    takes Valium daily as needed  .  Bruises easily    d/t meds  . Cancer ([place])    basal cell ca, in situ- uterine   . Cataracts, bilateral   . Chronic back pain   . Dizziness    r/t to meds  . GERD (gastroesophageal reflux disease)    no meds on a regular basis but will take Tums if needed  . Headache(784.0)    r/t neck issues  . History of bronchitis 6-8yrs ago  . History of colon polyps    benign  . Hyperlipidemia    takes Atorvastatin on Mondays and Fridays  . Hypertension    takes Ramipril daily  . Joint pain   . Joint swelling   . Multiple sclerosis ([place])   . Neuromuscular disorder ([place])    Dr. Park Liter- in [place]- [facility] follows M.S.  . Nocturia   . OA (osteoarthritis)    Dr Jani Gravel weekly  . PONV (postoperative nausea and vomiting)    trouble urinating after surgery in 2014  . Rheumatoid arthritis(714.0)    Dr Estanislado Pandy takes [name] daily  . Skin cancer   . Urinary retention    sees Dr.Wrenn about 2 times a yr    PAST SURGICAL HISTORY: Past Surgical History:  Procedure Laterality Date  . ABDOMINAL HYSTERECTOMY     1985  . APPENDECTOMY     with TAH  . BACK SURGERY     several  . CARPAL TUNNEL RELEASE    . CERVICAL FUSION     Dr Joya Salm  . CERVICAL FUSION  12/31/2012   Dr Joya Salm  . CHEST TUBE INSERTION     for traumatic Pneumothorax  . COLONOSCOPY  07/16/2010   normal   . COLONOSCOPY W/ POLYPECTOMY  1997   negative since; Dr Deatra Ina  . ESOPHAGOGASTRODUODENOSCOPY  07/16/2010   normal  . LUMBAR FUSION     Dr Joya Salm  . NASAL SINUS SURGERY    . POSTERIOR CERVICAL FUSION/FORAMINOTOMY N/A 12/31/2012   Procedure: POSTERIOR LATERAL CERVICAL FUSION/FORAMINOTOMY LEVEL 1 CERVICAL THREE-FOUR WITH LATERAL MASS SCREWS;  Surgeon:  Floyce Stakes, MD;  Location: [facility];  Service: Neurosurgery;  Laterality: N/A;  . SKIN BIOPSY    . THYROID SURGERY     R lobe removed- 1972  . TOTAL HIP ARTHROPLASTY  12/22/2011   Procedure: TOTAL HIP ARTHROPLASTY;  Surgeon: Kerin Salen, MD;  Location: [facility];  Service: Orthopedics;  Laterality: Left;    FAMILY HISTORY: Family History  Problem Relation Age of Onset  . Cancer Mother     pancreatic  . Diabetes Mother   . Heart disease Father     Rheumatic  . Cancer Father     ? stomach  . Cancer Sister     stomach  . Cancer Maternal Aunt     X 4; ? primary  . Heart disease Paternal Aunt   . Hypertension Neg Hx   . Stroke Neg Hx   . Cancer Maternal Grandmother     cervical  . Colon cancer      Aunts  . Multiple sclerosis Daughter     SOCIAL HISTORY:  Social History   Social History  . Marital status: Married    Spouse name: N/A  . Number of children: N/A  . Years of education: N/A   Occupational History  . retired    Social History Main Topics  . Smoking status: Former Smoker    Quit date: 05/26/1981  . Smokeless tobacco: Not on file  Comment: Smoked [phone], up to 2.5 ppd  . Alcohol use 0.0 oz/week     Comment: 2-3 beers every day 2%  . Drug use: No  . Sexual activity: Yes   Other Topics Concern  . Not on file   Social History Narrative  . No narrative on file     PHYSICAL EXAM  Vitals:   02/25/16 1329  BP: 116/78  Pulse: 74  Resp: 14  Weight: 113 lb 8 oz (51.5 kg)  Height: 5\' 4"  (1.626 m)    Body mass index is 19.48 kg/m.   General: The patient is well-developed and well-nourished and in no acute distress  Musculoskeletal:   Right biceps bulge from ruptured tendon.     Tender over medial right knee.   OK knee ROM  Skin: Extremities are without rash or edema.  Neurologic Exam  Mental status: The patient is alert and oriented x 3 at the time of the examination. The patient has apparent normal recent and remote memory,  with an apparently normal attention span and concentration ability.   Speech is normal.  CN:  EOMI, reduced facial sensation to soft touch left V2 and V3 .Facial strength is normal.  Trapezius and sternocleidomastoid strength is normal. No dysarthria is noted.   No obvious hearing deficits are noted.  Motor:  Muscle bulk is normal and tone is mildly increased in her legs, left > right. Strength appears 5 / 5 in all 4 extremities.   (did not test proximal right arm due to biceps injury)  Sensory: Sensory testing is intact to touch in all 4 extremities.  Coordination: Cerebellar testing reveals good rapid alternating movements in hands and feet  Gait and station: Station is stable. Gait was mildly wide and arthritic. She has moderately wide tandem walk.  Romberg is negative.   Reflexes: Deep tendon reflexes are symmetric and normal in legs.     DIAGNOSTIC DATA (LABS, IMAGING, TESTING) - I reviewed patient records, labs, notes, testing and imaging myself where available.  Lab Results  Component Value Date   WBC 6.0 06/15/2015   HGB 13.8 06/15/2015   HCT 41.4 06/15/2015   MCV 98.3 06/15/2015   PLT 358.0 06/15/2015      Component Value Date/Time   NA 139 06/15/2015 0953   NA 136 (A) 02/22/2015   NA 136 02/22/2015   K 4.0 06/15/2015 0953   K 4.4 02/22/2015   CL 100 06/15/2015 0953   CL 98 02/22/2015   CO2 31 06/15/2015 0953   CO2 30 02/22/2015   GLUCOSE 80 06/15/2015 0953   BUN 9 06/15/2015 0953   BUN 15 02/22/2015   BUN 15 02/22/2015   CREATININE 0.88 06/15/2015 0953   CREATININE 0.93 02/22/2015   CALCIUM 9.4 06/15/2015 0953   CALCIUM 9.6 02/22/2015   PROT 6.7 06/15/2015 0953   PROT 6.6 02/22/2015   ALBUMIN 4.1 06/15/2015 0953   ALBUMIN 4.3 02/22/2015   AST 21 06/15/2015 0953   AST 24 02/22/2015   ALT 14 06/15/2015 0953   ALT 18 02/22/2015   ALKPHOS 31 (L) 06/15/2015 0953   ALKPHOS 34 02/22/2015   BILITOT 0.5 06/15/2015 0953   BILITOT 0.5 02/22/2015   GFRNONAA >60  01/04/2015 1327   GFRAA >60 01/04/2015 1327   Lab Results  Component Value Date   CHOL 213 (H) 06/15/2015   HDL 85.30 06/15/2015   LDLCALC 104 (H) 06/15/2015   LDLDIRECT 122.9 11/15/2012   TRIG 118.0 06/15/2015   CHOLHDL 3 06/15/2015  Lab Results  Component Value Date   HGBA1C 5.6 10/24/2013   No results found for: VITAMINB12 Lab Results  Component Value Date   TSH 3.94 06/15/2015        ASSESSMENT AND PLAN  Multiple sclerosis (HCC)  Rheumatoid arthritis, involving unspecified site, unspecified rheumatoid factor presence (HCC)  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   Continue Xeljanz and prednisone for RA   If she has another relapse or if she does not do well with her RA, rituximab could be a good choice with efficacy for both diseases. 2.   Continue the oxycodone and valium at night to help her sleep 3.   Inject right knee with 40 mg Depo-Medrol in [place] using sterile technique.    She tolerated the injection well and there were no complications. 4.    return to see [place] in 5 months or sooner if she has new or worsening neurologic symptoms.    Dalissa Lovin A. Felecia Shelling, MD, PhD [id number], [id number] PM Certified in Neurology, Clinical Neurophysiology, Sleep Medicine, Pain Medicine and Neuroimaging  [facility] [address] [place], [place] [address] [phone]

## 2016-02-26 DIAGNOSIS — Z79899 Other long term (current) drug therapy: Secondary | ICD-10-CM | POA: Diagnosis not present

## 2016-02-28 ENCOUNTER — Ambulatory Visit (INDEPENDENT_AMBULATORY_CARE_PROVIDER_SITE_OTHER): Payer: Medicare Other | Admitting: Rheumatology

## 2016-02-28 DIAGNOSIS — Z79899 Other long term (current) drug therapy: Secondary | ICD-10-CM | POA: Diagnosis not present

## 2016-02-28 DIAGNOSIS — M79641 Pain in right hand: Secondary | ICD-10-CM | POA: Diagnosis not present

## 2016-02-28 DIAGNOSIS — M25461 Effusion, right knee: Secondary | ICD-10-CM

## 2016-02-28 DIAGNOSIS — M0579 Rheumatoid arthritis with rheumatoid factor of multiple sites without organ or systems involvement: Secondary | ICD-10-CM

## 2016-02-28 DIAGNOSIS — M25561 Pain in right knee: Secondary | ICD-10-CM | POA: Diagnosis not present

## 2016-02-28 DIAGNOSIS — M810 Age-related osteoporosis without current pathological fracture: Secondary | ICD-10-CM | POA: Diagnosis not present

## 2016-03-01 ENCOUNTER — Other Ambulatory Visit: Payer: Self-pay | Admitting: Radiology

## 2016-03-01 DIAGNOSIS — Z79899 Other long term (current) drug therapy: Secondary | ICD-10-CM

## 2016-03-13 ENCOUNTER — Other Ambulatory Visit: Payer: Self-pay | Admitting: Neurology

## 2016-03-13 ENCOUNTER — Telehealth: Payer: Self-pay | Admitting: *Deleted

## 2016-03-13 DIAGNOSIS — Z79899 Other long term (current) drug therapy: Secondary | ICD-10-CM | POA: Diagnosis not present

## 2016-03-13 NOTE — Telephone Encounter (Signed)
Tramadol rx faxed and confirmed to [facility] on [address], [place].

## 2016-03-17 ENCOUNTER — Telehealth: Payer: Self-pay | Admitting: Rheumatology

## 2016-03-17 ENCOUNTER — Other Ambulatory Visit: Payer: Self-pay | Admitting: Radiology

## 2016-03-17 MED ORDER — PREDNISONE 5 MG PO TABS: 5.0000 mg | ORAL_TABLET | Freq: Every day | ORAL | 0 refills | Status: DC

## 2016-03-17 MED ORDER — METHOTREXATE 2.5 MG PO TABS: 15.0000 mg | ORAL_TABLET | ORAL | 0 refills | Status: DC

## 2016-03-17 NOTE — Telephone Encounter (Signed)
Pt needs a sample of Xeljanz.

## 2016-03-17 NOTE — Telephone Encounter (Signed)
Received 90d. Request for a pred taper, not appropriate to refill pred taper, this was sent by [facility]. Patient states she needs refill of the Prednisone 5 mg. Dr Estanislado Pandy has approved this for her, along with the MTX Rx. I have sent in for her.

## 2016-03-19 MED ORDER — METHOTREXATE 2.5 MG PO TABS: 10.0000 mg | ORAL_TABLET | ORAL | 0 refills | Status: DC

## 2016-03-19 NOTE — Telephone Encounter (Signed)
I have called patient/ resent her MTX since when she was in the office it was sent for quantity of 4, have advised her yes, she may pick up a sample. Kidney function has declined a bit, and we will monitor, she will continue as previously Rx, 4/wk MTX and [place].   She may pick up a Xeljanz sample per Dr D

## 2016-04-07 ENCOUNTER — Telehealth: Payer: Self-pay | Admitting: Radiology

## 2016-04-07 NOTE — Telephone Encounter (Signed)
Samples of [name] were given to the patient, quantity 60 Lot Number [phone]

## 2016-04-08 ENCOUNTER — Encounter: Payer: Self-pay | Admitting: Internal Medicine

## 2016-04-08 ENCOUNTER — Encounter: Payer: Self-pay | Admitting: Emergency Medicine

## 2016-04-08 ENCOUNTER — Ambulatory Visit (INDEPENDENT_AMBULATORY_CARE_PROVIDER_SITE_OTHER): Payer: Medicare Other | Admitting: Internal Medicine

## 2016-04-08 VITALS — BP 124/86 | HR 67 | Temp 98.0°F | Resp 16 | Ht 64.0 in | Wt 114.0 lb

## 2016-04-08 DIAGNOSIS — M25551 Pain in right hip: Secondary | ICD-10-CM

## 2016-04-08 DIAGNOSIS — E785 Hyperlipidemia, unspecified: Secondary | ICD-10-CM

## 2016-04-08 DIAGNOSIS — I10 Essential (primary) hypertension: Secondary | ICD-10-CM | POA: Diagnosis not present

## 2016-04-08 DIAGNOSIS — Z0000 Encounter for general adult medical examination without abnormal findings: Secondary | ICD-10-CM | POA: Diagnosis not present

## 2016-04-08 DIAGNOSIS — R5382 Chronic fatigue, unspecified: Secondary | ICD-10-CM

## 2016-04-08 NOTE — Assessment & Plan Note (Signed)
BP well controlled Current regimen effective and well tolerated Continue current medications at current doses  

## 2016-04-08 NOTE — Assessment & Plan Note (Signed)
Related to RA and MS

## 2016-04-08 NOTE — Progress Notes (Signed)
Subjective:    Patient ID: Sherri [name], female    DOB: [birth year], 75 y.o.   MRN: [id number]  HPI She is here for a physical exam.   Right hip pain:  She has right buttock and lateral hip pain.  It hurts when she walks, but also has pain with sitting - there is a constant ache.  Sometimes when she walks it gives way.  She has had her left hip replaced. This pain is different than that was so she wonders if it is from arthritis or not.  She has seen ortho in the past for her left hip, but has not seen anyone yet for the right hip.   She deals with the joint pain from her RA and her MS symptoms, but overall feels ok.    Medications and allergies reviewed with patient and updated if appropriate.  Patient Active Problem List   Diagnosis Date Noted  . Right knee pain 02/25/2016  . Dyspepsia 04/05/2015  . Numbness 11/14/2014  . Biceps tendon tear 10/10/2014  . Insomnia 10/10/2014  . Gait disturbance 06/13/2014  . 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 OP (osteoporosis) 01/18/2014  . Other long term (current) drug therapy 01/18/2014  . Other dysphagia 12/29/2013  . Hoarseness 12/29/2013  . 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 Personal history of colonic polyps 07/03/2010  . Nocturia 05/28/2010  . POSTURAL HYPOTENSION 11/14/2008  . THYROID NODULE 05/17/2008  . Multiple sclerosis ([place]) 05/17/2008  . HYPERGLYCEMIA, BORDERLINE 05/17/2008  . Chronic fatigue 03/30/2007  . Hyperlipidemia 08/20/2006  . Essential hypertension 08/20/2006  . Rheumatoid arthritis ([place]) 08/20/2006  . CERVICAL CANCER, HX OF 08/20/2006  . GASTRIC ULCER, HX OF 08/20/2006    Current Outpatient Prescriptions on File Prior to Visit  Medication Sig Dispense Refill  . atorvastatin (LIPITOR) 10 MG tablet TAKE 1 TABLET BY MOUTH ON MONDAYS AND FRIDAYS 30 tablet 2  . Biotin 5000 MCG TABS Take 5,000 mcg by mouth daily.     . Calcium  Carbonate-Vitamin D (CALCIUM + D PO) Take 1 tablet by mouth daily. 1200 mg daily    . ciclopirox (PENLAC) 8 % solution Apply topically at bedtime. Apply over nail and surrounding skin. Apply daily over previous coat. After seven (7) days, may remove with alcohol and continue cycle. 6.6 mL 0  . Cyanocobalamin (VITAMIN B-12 PO) Take 1 tablet by mouth daily.    . diazepam (VALIUM) 5 MG tablet Take 1 tablet (5 mg total) by mouth every 8 (eight) hours as needed for anxiety. 90 tablet 0  . folic acid (FOLVITE) 1 MG tablet Take 1 mg by mouth 2 (two) times daily.    . furosemide (LASIX) 20 MG tablet TAKE 1 TABLET BY MOUTH EVERY DAY 90 tablet 1  . methotrexate (RHEUMATREX) 2.5 MG tablet Take 4 tablets (10 mg total) by mouth once a week. Caution:Chemotherapy. Protect from light. (Patient taking differently: Take 15 mg by mouth once a week. Caution:Chemotherapy. Protect from light.) 48 tablet 0  . Omega-3 Fatty Acids (OMEGA 3 PO) Take 1,000 mg by mouth daily.     . Oxycodone HCl 10 MG TABS Take 1 tablet (10 mg total) by mouth 3 (three) times daily as needed. 90 tablet 0  . predniSONE (DELTASONE) 5 MG tablet Take 1 tablet (5 mg total) by mouth daily. 90 tablet 0  . Pyridoxine HCl (VITAMIN B-6 PO) Take 1 tablet by mouth daily.    [name]  ramipril (ALTACE) 5 MG capsule Take 1 capsule (5 mg total) by mouth daily with breakfast. 90 capsule 2  . tiZANidine (ZANAFLEX) 4 MG capsule Take 4 mg by mouth 3 (three) times daily as needed for muscle spasms.    . Tofacitinib Citrate (XELJANZ) 5 MG TABS Take 5 mg by mouth 2 (two) times daily.     . traMADol (ULTRAM) 50 MG tablet TAKE 1 TABLET BY MOUTH 4 TIMES A DAY 120 tablet 3   No current facility-administered medications on file prior to visit.     Past Medical History:  Diagnosis Date  . Anxiety    takes Valium daily as needed  . Bruises easily    d/t meds  . Cancer ([place])    basal cell ca, in situ- uterine   . Cataracts, bilateral   . Chronic back pain   . Dizziness     r/t to meds  . GERD (gastroesophageal reflux disease)    no meds on a regular basis but will take Tums if needed  . Headache(784.0)    r/t neck issues  . History of bronchitis 6-29yrs ago  . History of colon polyps    benign  . Hyperlipidemia    takes Atorvastatin on Mondays and Fridays  . Hypertension    takes Ramipril daily  . Joint pain   . Joint swelling   . Multiple sclerosis ([place])   . Neuromuscular disorder ([place])    Dr. Park Liter- in [place]- [facility] follows M.S.  . Nocturia   . OA (osteoarthritis)    Dr Jani Gravel weekly  . PONV (postoperative nausea and vomiting)    trouble urinating after surgery in 2014  . Rheumatoid arthritis(714.0)    Dr Estanislado Pandy takes [name] daily  . Skin cancer   . Urinary retention    sees Dr.Wrenn about 2 times a yr    Past Surgical History:  Procedure Laterality Date  . ABDOMINAL HYSTERECTOMY     1985  . APPENDECTOMY     with TAH  . BACK SURGERY     several  . CARPAL TUNNEL RELEASE    . CERVICAL FUSION     Dr Joya Salm  . CERVICAL FUSION  12/31/2012   Dr Joya Salm  . CHEST TUBE INSERTION     for traumatic Pneumothorax  . COLONOSCOPY  07/16/2010   normal   . COLONOSCOPY W/ POLYPECTOMY  1997   negative since; Dr Deatra Ina  . ESOPHAGOGASTRODUODENOSCOPY  07/16/2010   normal  . LUMBAR FUSION     Dr Joya Salm  . NASAL SINUS SURGERY    . POSTERIOR CERVICAL FUSION/FORAMINOTOMY N/A 12/31/2012   Procedure: POSTERIOR LATERAL CERVICAL FUSION/FORAMINOTOMY LEVEL 1 CERVICAL THREE-FOUR WITH LATERAL MASS SCREWS;  Surgeon: Floyce Stakes, MD;  Location: [facility];  Service: Neurosurgery;  Laterality: N/A;  . SKIN BIOPSY    . THYROID SURGERY     R lobe removed- 1972  . TOTAL HIP ARTHROPLASTY  12/22/2011   Procedure: TOTAL HIP ARTHROPLASTY;  Surgeon: Kerin Salen, MD;  Location: [facility];  Service: Orthopedics;  Laterality: Left;    Social History   Social History  . Marital status: Married    Spouse name: N/A  . Number of children: N/A  .  Years of education: N/A   Occupational History  . retired    Social History Main Topics  . Smoking status: Former Smoker    Quit date: 05/26/1981  . Smokeless tobacco: Not on file     Comment: Smoked [phone],  up to 2.5 ppd  . Alcohol use 0.0 oz/week     Comment: 2-3 beers every day 2%  . Drug use: No  . Sexual activity: Yes   Other Topics Concern  . Not on file   Social History Narrative  . No narrative on file    Family History  Problem Relation Age of Onset  . Cancer Mother     pancreatic  . Diabetes Mother   . Heart disease Father     Rheumatic  . Cancer Father     ? stomach  . Cancer Sister     stomach  . Cancer Maternal Aunt     X 4; ? primary  . Heart disease Paternal Aunt   . Cancer Maternal Grandmother     cervical  . Colon cancer      Aunts  . Multiple sclerosis Daughter   . Hypertension Neg Hx   . Stroke Neg Hx     Review of Systems  Constitutional: Positive for fatigue. Negative for appetite change, chills, fever and unexpected weight change.  Eyes: Negative for visual disturbance.  Respiratory: Negative for cough, shortness of breath and wheezing.   Cardiovascular: Negative for chest pain, palpitations and leg swelling.  Gastrointestinal: Positive for constipation. Negative for abdominal pain, blood in stool and nausea.       No gerd  Genitourinary: Negative for dysuria and hematuria.  Musculoskeletal: Positive for arthralgias, joint swelling and myalgias.  Skin: Negative for color change and rash.  Neurological: Positive for dizziness. Negative for light-headedness and headaches.  Psychiatric/Behavioral: Negative for dysphoric mood. The patient is not nervous/anxious.        Objective:   Vitals:   04/08/16 1351  BP: 124/86  Pulse: 67  Resp: 16  Temp: 98 F (36.7 C)   Filed Weights   04/08/16 1351  Weight: 114 lb (51.7 kg)   Body mass index is 19.57 kg/m.   Physical Exam Constitutional: She appears well-developed and  well-nourished. No distress.  HENT:  Head: Normocephalic and atraumatic.  Right Ear: External ear normal. Normal ear canal and TM Left Ear: External ear normal.  Normal ear canal and TM Mouth/Throat: Oropharynx is clear and moist.  Eyes: Conjunctivae and EOM are normal.  Neck: Neck supple. No tracheal deviation present. Right sided thyromegaly present.  No carotid bruit  Cardiovascular: Normal rate, regular rhythm and normal heart sounds.   No murmur heard.  No edema. Pulmonary/Chest: Effort normal and breath sounds normal. No respiratory distress. She has no wheezes. She has no rales.  Breast: deferred to Gyn Abdominal: Soft. She exhibits no distension. There is no tenderness.  Lymphadenopathy: She has no cervical adenopathy.  Skin: Skin is warm and dry. She is not diaphoretic.  Psychiatric: She has a normal mood and affect. Her behavior is normal.         Assessment & Plan:   Physical exam: Screening blood work  ordered Immunizations Up to date  Colonoscopy Up to date  Mammogram  Up to date  Gyn   - see dr Matthew Saras Dexa   Up to date  Eye exams  Up to date  EKG  - done 04/2015 Exercise - none due to chronic medical problems - has tried multiple types of exericse Weight - normal BMI Skin  - no concerns, sees derm  Substance abuse  none  See Problem List for Assessment and Plan of chronic medical problems.

## 2016-04-08 NOTE — Assessment & Plan Note (Signed)
Check lipids - elevated due to [employer]

## 2016-04-08 NOTE — Patient Instructions (Addendum)
Test(s) ordered today. Your results will be released to [place] (or called to you) after review, usually within 72hours after test completion. If any changes need to be made, you will be notified at that same time.  All other Health Maintenance issues reviewed.   All recommended immunizations and age-appropriate screenings are up-to-date or discussed.  No immunizations administered today.   Medications reviewed and updated.  No changes recommended at this time.  Your prescription(s) have been submitted to your pharmacy. Please take as directed and contact our office if you believe you are having problem(s) with the medication(s).  A referral was ordered for Dr Mayer Camel for your hip pain.   Please followup in 6 months    Health Maintenance, Female Introduction Adopting a healthy lifestyle and getting preventive care can go a long way to promote health and wellness. Talk with your health care provider about what schedule of regular examinations is right for you. This is a good chance for you to check in with your provider about disease prevention and staying healthy. In between checkups, there are plenty of things you can do on your own. Experts have done a lot of research about which lifestyle changes and preventive measures are most likely to keep you healthy. Ask your health care provider for more information. Weight and diet Eat a healthy diet  Be sure to include plenty of vegetables, fruits, low-fat dairy products, and lean protein.  Do not eat a lot of foods high in solid fats, added sugars, or salt.  Get regular exercise. This is one of the most important things you can do for your health.  Most adults should exercise for at least 150 minutes each week. The exercise should increase your heart rate and make you sweat (moderate-intensity exercise).  Most adults should also do strengthening exercises at least twice a week. This is in addition to the moderate-intensity  exercise. Maintain a healthy weight  Body mass index (BMI) is a measurement that can be used to identify possible weight problems. It estimates body fat based on height and weight. Your health care provider can help determine your BMI and help you achieve or maintain a healthy weight.  For females 61 years of age and older:  A BMI below 18.5 is considered underweight.  A BMI of 18.5 to 24.9 is normal.  A BMI of 25 to 29.9 is considered overweight.  A BMI of 30 and above is considered obese. Watch levels of cholesterol and blood lipids  You should start having your blood tested for lipids and cholesterol at 74 years of age, then have this test every 5 years.  You may need to have your cholesterol levels checked more often if:  Your lipid or cholesterol levels are high.  You are older than 74 years of age.  You are at high risk for heart disease. Cancer screening Lung Cancer  Lung cancer screening is recommended for adults 75-53 years old who are at high risk for lung cancer because of a history of smoking.  A yearly low-dose CT scan of the lungs is recommended for people who:  Currently smoke.  Have quit within the past 15 years.  Have at least a 30-pack-year history of smoking. A pack year is smoking an average of one pack of cigarettes a day for 1 year.  Yearly screening should continue until it has been 15 years since you quit.  Yearly screening should stop if you develop a health problem that would prevent you from  having lung cancer treatment. Breast Cancer  Practice breast self-awareness. This means understanding how your breasts normally appear and feel.  It also means doing regular breast self-exams. Let your health care provider know about any changes, no matter how small.  If you are in your 20s or 30s, you should have a clinical breast exam (CBE) by a health care provider every 1-3 years as part of a regular health exam.  If you are 54 or older, have a CBE  every year. Also consider having a breast X-ray (mammogram) every year.  If you have a family history of breast cancer, talk to your health care provider about genetic screening.  If you are at high risk for breast cancer, talk to your health care provider about having an MRI and a mammogram every year.  Breast cancer gene (BRCA) assessment is recommended for women who have family members with BRCA-related cancers. BRCA-related cancers include:  Breast.  Ovarian.  Tubal.  Peritoneal cancers.  Results of the assessment will determine the need for genetic counseling and BRCA1 and BRCA2 testing. Cervical Cancer  Your health care provider may recommend that you be screened regularly for cancer of the pelvic organs (ovaries, uterus, and vagina). This screening involves a pelvic examination, including checking for microscopic changes to the surface of your cervix (Pap test). You may be encouraged to have this screening done every 3 years, beginning at age 71.  For women ages 77-65, health care providers may recommend pelvic exams and Pap testing every 3 years, or they may recommend the Pap and pelvic exam, combined with testing for human papilloma virus (HPV), every 5 years. Some types of HPV increase your risk of cervical cancer. Testing for HPV may also be done on women of any age with unclear Pap test results.  Other health care providers may not recommend any screening for nonpregnant women who are considered low risk for pelvic cancer and who do not have symptoms. Ask your health care provider if a screening pelvic exam is right for you.  If you have had past treatment for cervical cancer or a condition that could lead to cancer, you need Pap tests and screening for cancer for at least 20 years after your treatment. If Pap tests have been discontinued, your risk factors (such as having a new sexual partner) need to be reassessed to determine if screening should resume. Some women have medical  problems that increase the chance of getting cervical cancer. In these cases, your health care provider may recommend more frequent screening and Pap tests. Colorectal Cancer  This type of cancer can be detected and often prevented.  Routine colorectal cancer screening usually begins at 74 years of age and continues through 74 years of age.  Your health care provider may recommend screening at an earlier age if you have risk factors for colon cancer.  Your health care provider may also recommend using home test kits to check for hidden blood in the stool.  A small camera at the end of a tube can be used to examine your colon directly (sigmoidoscopy or colonoscopy). This is done to check for the earliest forms of colorectal cancer.  Routine screening usually begins at age 96.  Direct examination of the colon should be repeated every 5-10 years through 74 years of age. However, you may need to be screened more often if early forms of precancerous polyps or small growths are found. Skin Cancer  Check your skin from head to toe regularly.  Tell your health care provider about any new moles or changes in moles, especially if there is a change in a mole's shape or color.  Also tell your health care provider if you have a mole that is larger than the size of a pencil eraser.  Always use sunscreen. Apply sunscreen liberally and repeatedly throughout the day.  Protect yourself by wearing long sleeves, pants, a wide-brimmed hat, and sunglasses whenever you are outside. Heart disease, diabetes, and high blood pressure  High blood pressure causes heart disease and increases the risk of stroke. High blood pressure is more likely to develop in:  People who have blood pressure in the high end of the normal range (130-139/85-89 mm Hg).  People who are overweight or obese.  People who are African American.  If you are 79-20 years of age, have your blood pressure checked every 3-5 years. If you  are 21 years of age or older, have your blood pressure checked every year. You should have your blood pressure measured twice-once when you are at a hospital or clinic, and once when you are not at a hospital or clinic. Record the average of the two measurements. To check your blood pressure when you are not at a hospital or clinic, you can use:  An automated blood pressure machine at a pharmacy.  A home blood pressure monitor.  If you are between 56 years and 60 years old, ask your health care provider if you should take aspirin to prevent strokes.  Have regular diabetes screenings. This involves taking a blood sample to check your fasting blood sugar level.  If you are at a normal weight and have a low risk for diabetes, have this test once every three years after 74 years of age.  If you are overweight and have a high risk for diabetes, consider being tested at a younger age or more often. Preventing infection Hepatitis B  If you have a higher risk for hepatitis B, you should be screened for this virus. You are considered at high risk for hepatitis B if:  You were born in a country where hepatitis B is common. Ask your health care provider which countries are considered high risk.  Your parents were born in a high-risk country, and you have not been immunized against hepatitis B (hepatitis B vaccine).  You have HIV or AIDS.  You use needles to inject street drugs.  You live with someone who has hepatitis B.  You have had sex with someone who has hepatitis B.  You get hemodialysis treatment.  You take certain medicines for conditions, including cancer, organ transplantation, and autoimmune conditions. Hepatitis C  Blood testing is recommended for:  Everyone born from [birth year] through 1964.  Anyone with known risk factors for hepatitis C. Sexually transmitted infections (STIs)  You should be screened for sexually transmitted infections (STIs) including gonorrhea and chlamydia  if:  You are sexually active and are younger than 74 years of age.  You are older than 74 years of age and your health care provider tells you that you are at risk for this type of infection.  Your sexual activity has changed since you were last screened and you are at an increased risk for chlamydia or gonorrhea. Ask your health care provider if you are at risk.  If you do not have HIV, but are at risk, it may be recommended that you take a prescription medicine daily to prevent HIV infection. This is called pre-exposure prophylaxis (PrEP).  You are considered at risk if:  You are sexually active and do not regularly use condoms or know the HIV status of your partner(s).  You take drugs by injection.  You are sexually active with a partner who has HIV. Talk with your health care provider about whether you are at high risk of being infected with HIV. If you choose to begin PrEP, you should first be tested for HIV. You should then be tested every 3 months for as long as you are taking PrEP. Pregnancy  If you are premenopausal and you may become pregnant, ask your health care provider about preconception counseling.  If you may become pregnant, take 400 to 800 micrograms (mcg) of folic acid every day.  If you want to prevent pregnancy, talk to your health care provider about birth control (contraception). Osteoporosis and menopause  Osteoporosis is a disease in which the bones lose minerals and strength with aging. This can result in serious bone fractures. Your risk for osteoporosis can be identified using a bone density scan.  If you are 32 years of age or older, or if you are at risk for osteoporosis and fractures, ask your health care provider if you should be screened.  Ask your health care provider whether you should take a calcium or vitamin D supplement to lower your risk for osteoporosis.  Menopause may have certain physical symptoms and risks.  Hormone replacement therapy may  reduce some of these symptoms and risks. Talk to your health care provider about whether hormone replacement therapy is right for you. Follow these instructions at home:  Schedule regular health, dental, and eye exams.  Stay current with your immunizations.  Do not use any tobacco products including cigarettes, chewing tobacco, or electronic cigarettes.  If you are pregnant, do not drink alcohol.  If you are breastfeeding, limit how much and how often you drink alcohol.  Limit alcohol intake to no more than 1 drink per day for nonpregnant women. One drink equals 12 ounces of beer, 5 ounces of wine, or 1 ounces of hard liquor.  Do not use street drugs.  Do not share needles.  Ask your health care provider for help if you need support or information about quitting drugs.  Tell your health care provider if you often feel depressed.  Tell your health care provider if you have ever been abused or do not feel safe at home. This information is not intended to replace advice given to you by your health care provider. Make sure you discuss any questions you have with your health care provider. Document Released: 11/25/2010 Document Revised: 10/18/2015 Document Reviewed: 02/13/2015  2017 Elsevier

## 2016-04-08 NOTE — Assessment & Plan Note (Signed)
Referred to ortho- dr Mayer Camel for further eval

## 2016-04-08 NOTE — Progress Notes (Signed)
Pre visit review using our clinic review tool, if applicable. No additional management support is needed unless otherwise documented below in the visit note. 

## 2016-04-14 ENCOUNTER — Other Ambulatory Visit (INDEPENDENT_AMBULATORY_CARE_PROVIDER_SITE_OTHER): Payer: Medicare Other

## 2016-04-14 ENCOUNTER — Encounter: Payer: Self-pay | Admitting: Internal Medicine

## 2016-04-14 DIAGNOSIS — Z0000 Encounter for general adult medical examination without abnormal findings: Secondary | ICD-10-CM

## 2016-04-14 LAB — CBC WITH DIFFERENTIAL/PLATELET
BASOS ABS: 0 10*3/uL (ref 0.0–0.1)
BASOS PCT: 0.3 % (ref 0.0–3.0)
EOS ABS: 0 10*3/uL (ref 0.0–0.7)
Eosinophils Relative: 0.7 % (ref 0.0–5.0)
HEMATOCRIT: 40.6 % (ref 36.0–46.0)
Hemoglobin: 13.8 g/dL (ref 12.0–15.0)
LYMPHS ABS: 1.6 10*3/uL (ref 0.7–4.0)
LYMPHS PCT: 31 % (ref 12.0–46.0)
MCHC: 34 g/dL (ref 30.0–36.0)
MCV: 99.9 fl (ref 78.0–100.0)
Monocytes Absolute: 0.4 10*3/uL (ref 0.1–1.0)
Monocytes Relative: 7.9 % (ref 3.0–12.0)
NEUTROS ABS: 3.1 10*3/uL (ref 1.4–7.7)
NEUTROS PCT: 60.1 % (ref 43.0–77.0)
PLATELETS: 328 10*3/uL (ref 150.0–400.0)
RBC: 4.06 Mil/uL (ref 3.87–5.11)
RDW: 15.3 % (ref 11.5–15.5)
WBC: 5.1 10*3/uL (ref 4.0–10.5)

## 2016-04-14 LAB — COMPREHENSIVE METABOLIC PANEL
ALT: 17 U/L (ref 0–35)
AST: 21 U/L (ref 0–37)
Albumin: 4.1 g/dL (ref 3.5–5.2)
Alkaline Phosphatase: 41 U/L (ref 39–117)
BILIRUBIN TOTAL: 0.5 mg/dL (ref 0.2–1.2)
BUN: 12 mg/dL (ref 6–23)
CHLORIDE: 101 meq/L (ref 96–112)
CO2: 27 meq/L (ref 19–32)
CREATININE: 0.88 mg/dL (ref 0.40–1.20)
Calcium: 9.8 mg/dL (ref 8.4–10.5)
GFR: 66.74 mL/min (ref >60.00)
GLUCOSE: 79 mg/dL (ref 70–99)
Potassium: 4.4 mEq/L (ref 3.5–5.1)
SODIUM: 138 meq/L (ref 135–145)
Total Protein: 6.7 g/dL (ref 6.0–8.3)

## 2016-04-14 LAB — LIPID PANEL
CHOL/HDL RATIO: 2
Cholesterol: 205 mg/dL — ABNORMAL HIGH (ref 0–200)
HDL: 82.9 mg/dL (ref >39.00)
LDL Cholesterol: 98 mg/dL (ref 0–99)
NONHDL: 121.6
Triglycerides: 116 mg/dL (ref 0.0–149.0)
VLDL: 23.2 mg/dL (ref 0.0–40.0)

## 2016-04-14 LAB — TSH: TSH: 4.11 u[IU]/mL (ref 0.35–4.50)

## 2016-04-15 ENCOUNTER — Encounter: Payer: Self-pay | Admitting: Internal Medicine

## 2016-04-25 DIAGNOSIS — N390 Urinary tract infection, site not specified: Secondary | ICD-10-CM | POA: Diagnosis not present

## 2016-04-25 DIAGNOSIS — H2513 Age-related nuclear cataract, bilateral: Secondary | ICD-10-CM | POA: Diagnosis not present

## 2016-04-25 DIAGNOSIS — H5203 Hypermetropia, bilateral: Secondary | ICD-10-CM | POA: Diagnosis not present

## 2016-04-25 DIAGNOSIS — N3010 Interstitial cystitis (chronic) without hematuria: Secondary | ICD-10-CM | POA: Diagnosis not present

## 2016-04-29 ENCOUNTER — Telehealth: Payer: Self-pay | Admitting: Rheumatology

## 2016-04-29 NOTE — Telephone Encounter (Signed)
Patient has questions about a medication she says was ordered for her but she did not know anything about taking it. Please call patient back soon.

## 2016-04-30 ENCOUNTER — Telehealth: Payer: Self-pay | Admitting: Rheumatology

## 2016-04-30 NOTE — Telephone Encounter (Signed)
Attempted to return patient's call and left message for patient to call the office.  

## 2016-04-30 NOTE — Telephone Encounter (Signed)
Patient returned Andrea's call, cb# [phone]

## 2016-04-30 NOTE — Telephone Encounter (Signed)
Patient returned Sherri [name] call.  

## 2016-05-01 DIAGNOSIS — M25552 Pain in left hip: Secondary | ICD-10-CM | POA: Diagnosis not present

## 2016-05-01 DIAGNOSIS — M7061 Trochanteric bursitis, right hip: Secondary | ICD-10-CM | POA: Diagnosis not present

## 2016-05-01 DIAGNOSIS — Z96642 Presence of left artificial hip joint: Secondary | ICD-10-CM | POA: Diagnosis not present

## 2016-05-01 NOTE — Telephone Encounter (Signed)
Patient states [facility] contacted her stating they were going to deliver a prescription to our office for Clindamycin for her. Patient states she is not taking that prescription and it has not been ordered for her so she cancelled the prescription. After review of the patient's chart the prescription has not been order for the patient.

## 2016-05-02 NOTE — Progress Notes (Signed)
Office Visit Note  Patient: Sherri [name]             Date of Birth: 1940-10-13           MRN: [id number]             PCP: Binnie Rail, MD Referring: Binnie Rail, MD Visit Date: 05/05/2016 Occupation: @GUAROCC @    Subjective:  No chief complaint on file. Follow-up on rheumatoid arthritis and high-risk prescription  History of Present Illness: Sherri [name] is a 75 y.o. female  Patient states that she is doing well with her rheumatoid arthritis. She is using Xeljanz XR 11 mg daily and methotrexate 6 per week.  She is complaining of right SI joint pain and right greater trochanter bursa pain. Is requesting an injection if  possible   Activities of Daily Living:  Patient reports morning stiffness for 30 minutes.   Patient Reports nocturnal pain.  Difficulty dressing/grooming: Reports Difficulty climbing stairs: Reports Difficulty getting out of chair: Reports Difficulty using hands for taps, buttons, cutlery, and/or writing: Reports   Review of Systems  Constitutional: Negative for fatigue.  HENT: Negative for mouth sores and mouth dryness.   Eyes: Negative for dryness.  Respiratory: Negative for shortness of breath.   Gastrointestinal: Negative for constipation and diarrhea.  Musculoskeletal: Negative for myalgias and myalgias.  Skin: Negative for sensitivity to sunlight.  Psychiatric/Behavioral: Negative for decreased concentration and sleep disturbance.    PMFS History:  Patient Active Problem List   Diagnosis Date Noted  . Right hip pain 04/08/2016  . Right knee pain 02/25/2016  . Dyspepsia 04/05/2015  . Numbness 11/14/2014  . Biceps tendon tear 10/10/2014  . Insomnia 10/10/2014  . Gait disturbance 06/13/2014  . 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 OP (osteoporosis) 01/18/2014  . Other long term (current) drug therapy 01/18/2014  . Other dysphagia 12/29/2013  . Hoarseness 12/29/2013  . 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 Personal history of colonic polyps 07/03/2010  . Nocturia 05/28/2010  . THYROID NODULE 05/17/2008  . Multiple sclerosis ([place]) 05/17/2008  . Chronic fatigue 03/30/2007  . Hyperlipidemia 08/20/2006  . Essential hypertension 08/20/2006  . Rheumatoid arthritis ([place]) 08/20/2006  . CERVICAL CANCER, HX OF 08/20/2006  . GASTRIC ULCER, HX OF 08/20/2006    Past Medical History:  Diagnosis Date  . Anxiety    takes Valium daily as needed  . Bruises easily    d/t meds  . Cancer ([place])    basal cell ca, in situ- uterine   . Cataracts, bilateral   . Chronic back pain   . Dizziness    r/t to meds  . GERD (gastroesophageal reflux disease)    no meds on a regular basis but will take Tums if needed  . Headache(784.0)    r/t neck issues  . History of bronchitis 6-73yrs ago  . History of colon polyps    benign  . Hyperlipidemia    takes Atorvastatin on Mondays and Fridays  . Hypertension    takes Ramipril daily  . Joint pain   . Joint swelling   . Multiple sclerosis ([place])   . Neuromuscular disorder ([place])    Dr. Park Liter- in [place]- [facility] follows M.S.  . Nocturia   . OA (osteoarthritis)    Dr Jani Gravel weekly  . PONV (postoperative nausea and vomiting)    trouble urinating after surgery in 2014  . Rheumatoid arthritis(714.0)  Dr Estanislado Pandy takes [name] daily  . Skin cancer   . Urinary retention    sees Dr.Wrenn about 2 times a yr    Family History  Problem Relation Age of Onset  . Cancer Mother     pancreatic  . Diabetes Mother   . Heart disease Father     Rheumatic  . Cancer Father     ? stomach  . Cancer Sister     stomach  . Cancer Maternal Aunt     X 4; ? primary  . Heart disease Paternal Aunt   . Cancer Maternal Grandmother     cervical  . Colon cancer      Aunts  . Multiple sclerosis Daughter   . Hypertension Neg Hx   . Stroke Neg Hx    Past Surgical History:  Procedure Laterality Date  .  ABDOMINAL HYSTERECTOMY     1985  . APPENDECTOMY     with TAH  . BACK SURGERY     several  . CARPAL TUNNEL RELEASE    . CERVICAL FUSION     Dr Joya Salm  . CERVICAL FUSION  12/31/2012   Dr Joya Salm  . CHEST TUBE INSERTION     for traumatic Pneumothorax  . COLONOSCOPY  07/16/2010   normal   . COLONOSCOPY W/ POLYPECTOMY  1997   negative since; Dr Deatra Ina  . ESOPHAGOGASTRODUODENOSCOPY  07/16/2010   normal  . LUMBAR FUSION     Dr Joya Salm  . NASAL SINUS SURGERY    . POSTERIOR CERVICAL FUSION/FORAMINOTOMY N/A 12/31/2012   Procedure: POSTERIOR LATERAL CERVICAL FUSION/FORAMINOTOMY LEVEL 1 CERVICAL THREE-FOUR WITH LATERAL MASS SCREWS;  Surgeon: Floyce Stakes, MD;  Location: [facility];  Service: Neurosurgery;  Laterality: N/A;  . SKIN BIOPSY    . THYROID SURGERY     R lobe removed- 1972  . TOTAL HIP ARTHROPLASTY  12/22/2011   Procedure: TOTAL HIP ARTHROPLASTY;  Surgeon: Kerin Salen, MD;  Location: [facility];  Service: Orthopedics;  Laterality: Left;   Social History   Social History Narrative  . No narrative on file     Objective: Vital Signs: BP (!) 98/58 (BP Location: Left Arm, Patient Position: Sitting, Cuff Size: Large)   Pulse 78   Resp 14   Ht 5\' 3"  (1.6 m)   Wt 116 lb (52.6 kg)   BMI 20.55 kg/m    Physical Exam  Constitutional: She is oriented to person, place, and time. She appears well-developed and well-nourished.  HENT:  Head: Normocephalic and atraumatic.  Eyes: EOM are normal. Pupils are equal, round, and reactive to light.  Cardiovascular: Normal rate, regular rhythm and normal heart sounds.  Exam reveals no gallop and no friction rub.   No murmur heard. Pulmonary/Chest: Effort normal and breath sounds normal. She has no wheezes. She has no rales.  Abdominal: Soft. Bowel sounds are normal. She exhibits no distension. There is no tenderness. There is no guarding. No hernia.  Musculoskeletal: Normal range of motion. She exhibits no edema, tenderness or deformity.    Lymphadenopathy:    She has no cervical adenopathy.  Neurological: She is alert and oriented to person, place, and time. Coordination normal.  Skin: Skin is warm and dry. Capillary refill takes less than 2 seconds. No rash noted.  Psychiatric: She has a normal mood and affect. Her behavior is normal.     Musculoskeletal Exam:  Full range of motion of all joints Grip strength is equal and strong bilaterally Fiber myalgia tender  points are all absent except she is having right greater trochanter bursa pain and right SI joint pain on examination  CDAI Exam: CDAI Homunculus Exam:   Joint Counts:  CDAI Tender Joint count: 0 CDAI Swollen Joint count: 0  Global Assessments:  Patient Global Assessment: 6 Provider Global Assessment: 6  CDAI Calculated Score: 12    Investigation: Findings:  03/13/16 CBC normal CBC shows Creat 1.01 and GFR 55 08/30/2015 TB gold negative  2008 screening hepatitis panel neg Hep C, neg Hep A,  Hep B core Ab +, Hep B DNA Ab negative     Imaging: No results found.  Speciality Comments: No specialty comments available.    Procedures:  Large Joint Inj Date/Time: 05/05/2016 3:22 PM Performed by: Eliezer Lofts Authorized by: Eliezer Lofts   Consent Given by:  Patient Site marked: the procedure site was marked   Timeout: prior to procedure the correct patient, procedure, and site was verified   Indications:  Pain Location:  Sacroiliac Site:  R sacroiliac joint Prep: patient was prepped and draped in usual sterile fashion   Needle Size:  27 G Needle Length:  1.5 inches Approach:  Superior Ultrasound Guidance: No   Fluoroscopic Guidance: No   Arthrogram: No   Medications:  1 mL lidocaine 1 %; 40 mg triamcinolone acetonide 40 MG/ML Aspiration Attempted: Yes   Aspirate amount (mL):  0 Patient tolerance:  Patient tolerated the procedure well with no immediate complications   There was no complication after the right SI joint was  injected. Patient tolerated procedure well. She had about 30% improvement 5 minutes after the injection.   Allergies: Meperidine hcl; Penicillins; Azathioprine; Darvon [propoxyphene hcl]; and Ezetimibe-simvastatin   Assessment / Plan:     Visit Diagnoses: Rheumatoid arthritis involving multiple sites with positive rheumatoid factor (HCC)  High risk medication use  Primary osteoarthritis of both knees  Sacroiliitis, not elsewhere classified ([place])  Age-related osteoporosis without current pathological fracture  Primary insomnia  Gait disturbance  Multiple sclerosis (HCC)   Complaining of right hip pain. Consistent with bursitis. Pain on palpation of right greater trochanter bursa as well as right SI joint pain. Patient will use Voltaren gel to the right greater trochanter bursa. I will inject the right SI joint to give patient maximum relief. After informed consent was obtained the site was prepped in usual sterile fashion and injected with 40 mg of Kenalog mixed with one half mL's 1% lidocaine. Patient tolerated procedure well. There are no  Complications. Patient states that she is about 30% better about 5 minutes after the injection. The SI joint is improved as well as her right greater trochanter bursa.  She will use Voltaren gel to the other joints as discussed for pain relief  Xeljanz XR: 2 bottles given to patient as samples. Continue methotrexate 6 per week  Patient is currently on re-class for her osteoporosis. We will consider Forteo in the future if indicated   There is no synovitis on examination today. [name] and methotrexate are working well for the patient. She has synovial thickening of the second and third MCP joint on the right hand.   Orders: No orders of the defined types were placed in this encounter.  Meds ordered this encounter  Medications  . Tofacitinib Citrate (XELJANZ XR) 11 MG TB24    Sig: Take 11 mg by mouth daily.    Dispense:  60 tablet     Refill:  0    Order Specific Question:   Supervising  Provider    Answer:   Bo Merino [id number]    Face-to-face time spent with patient was 30 minutes. 50% of time was spent in counseling and coordination of care.  Follow-Up Instructions: Return in about 4 months (around 09/03/2016) for RA,right hip pain, right knee pain, oporosis,.   Eliezer Lofts, PA-C

## 2016-05-05 ENCOUNTER — Ambulatory Visit (INDEPENDENT_AMBULATORY_CARE_PROVIDER_SITE_OTHER): Payer: Medicare Other | Admitting: Rheumatology

## 2016-05-05 ENCOUNTER — Encounter: Payer: Self-pay | Admitting: Rheumatology

## 2016-05-05 VITALS — BP 98/58 | HR 78 | Resp 14 | Ht 63.0 in | Wt 116.0 lb

## 2016-05-05 DIAGNOSIS — M170 Bilateral primary osteoarthritis of knee: Secondary | ICD-10-CM

## 2016-05-05 DIAGNOSIS — Z79899 Other long term (current) drug therapy: Secondary | ICD-10-CM

## 2016-05-05 DIAGNOSIS — M0579 Rheumatoid arthritis with rheumatoid factor of multiple sites without organ or systems involvement: Secondary | ICD-10-CM | POA: Diagnosis not present

## 2016-05-05 DIAGNOSIS — R269 Unspecified abnormalities of gait and mobility: Secondary | ICD-10-CM

## 2016-05-05 DIAGNOSIS — M810 Age-related osteoporosis without current pathological fracture: Secondary | ICD-10-CM

## 2016-05-05 DIAGNOSIS — M461 Sacroiliitis, not elsewhere classified: Secondary | ICD-10-CM | POA: Diagnosis not present

## 2016-05-05 DIAGNOSIS — G35 Multiple sclerosis: Secondary | ICD-10-CM

## 2016-05-05 DIAGNOSIS — F5101 Primary insomnia: Secondary | ICD-10-CM

## 2016-05-05 MED ORDER — LIDOCAINE HCL 1 % IJ SOLN
1.0000 mL | INTRAMUSCULAR | Status: AC | PRN
  Administered 2016-05-05: 1 mL

## 2016-05-05 MED ORDER — TOFACITINIB CITRATE ER 11 MG PO TB24: 11.0000 mg | ORAL_TABLET | Freq: Every day | ORAL | 0 refills | Status: DC

## 2016-05-05 MED ORDER — TRIAMCINOLONE ACETONIDE 40 MG/ML IJ SUSP
40.0000 mg | INTRAMUSCULAR | Status: AC | PRN
  Administered 2016-05-05: 40 mg via INTRA_ARTICULAR

## 2016-05-05 NOTE — Progress Notes (Signed)
Rheumatology Medication Review by a Pharmacist Does the patient feel that his/her medications are working for him/her?  Yes Has the patient been experiencing any side effects to the medications prescribed?  No Does the patient have any problems obtaining medications?  Yes - patient is unable to afford Sherri [name] and reports she does not qualify for the patient assistance program or grant foundations.  She has bene receiving samples of Xeljanz from clinic.    Issues to address at subsequent visits: Continue to assess whether patient qualifies for grant foundations at future visits.    Pharmacist comments:  Sherri [name] is a pleasant 75 yo F who presents for follow up of rheumatoid arthritis.  Patient is currently taking Xeljanz XR 11 mg daily, methotrexate 6 tablets per week, folic acid 1 mg daily, and prednisone 5 mg daily.  Patient had standing labs (CBC, CMP) on 04/14/16 which were normal.  She will be due for standing labs again in January 2018.  Patient also had lipid panel on 04/14/16 which showed total cholesterol of 205 mg/dL and LDL of 98 mg/dL.  Patient had TB Gold in April 2017 which was negative.  She will be due for TB Gold again in April 2018.  Patient denied any medication related questions at this time.     Elisabeth Most, Pharm.D., BCPS Clinical Pharmacist Pager: [phone] Phone: [phone] 05/05/2016 2:24 PM

## 2016-05-05 NOTE — Patient Instructions (Signed)
Standing Labs We placed an order today for your standing lab work.    Please come back and get your standing labs in January 2018 and every 2 months.    We have open lab Monday through Friday from 8:30-11:30 AM and 1:30-4 PM at the office of Dr. Tresa Moore, PA.   The office is located at [address] [place], [place], [place] [address] No appointment is necessary.   Labs are drawn by [facility].  You may receive a bill from [facility] for your lab work.

## 2016-05-07 DIAGNOSIS — D0461 Carcinoma in situ of skin of right upper limb, including shoulder: Secondary | ICD-10-CM | POA: Diagnosis not present

## 2016-05-07 DIAGNOSIS — D0472 Carcinoma in situ of skin of left lower limb, including hip: Secondary | ICD-10-CM | POA: Diagnosis not present

## 2016-05-07 DIAGNOSIS — D0462 Carcinoma in situ of skin of left upper limb, including shoulder: Secondary | ICD-10-CM | POA: Diagnosis not present

## 2016-05-20 ENCOUNTER — Other Ambulatory Visit: Payer: Self-pay | Admitting: Emergency Medicine

## 2016-05-20 MED ORDER — ATORVASTATIN CALCIUM 10 MG PO TABS: ORAL_TABLET | ORAL | 2 refills | Status: DC

## 2016-05-27 DIAGNOSIS — H2512 Age-related nuclear cataract, left eye: Secondary | ICD-10-CM | POA: Diagnosis not present

## 2016-05-27 DIAGNOSIS — H25812 Combined forms of age-related cataract, left eye: Secondary | ICD-10-CM | POA: Diagnosis not present

## 2016-06-03 ENCOUNTER — Other Ambulatory Visit: Payer: Self-pay | Admitting: *Deleted

## 2016-06-03 ENCOUNTER — Other Ambulatory Visit (INDEPENDENT_AMBULATORY_CARE_PROVIDER_SITE_OTHER): Payer: Self-pay | Admitting: Rheumatology

## 2016-06-03 DIAGNOSIS — Z79899 Other long term (current) drug therapy: Secondary | ICD-10-CM

## 2016-06-03 MED ORDER — PREDNISONE 5 MG PO TABS: 5.0000 mg | ORAL_TABLET | Freq: Every day | ORAL | 0 refills | Status: DC

## 2016-06-03 NOTE — Telephone Encounter (Signed)
Last Visit:05/05/16 Next Visit: 09/04/16  Okay to refill Prednisone?

## 2016-06-03 NOTE — Telephone Encounter (Signed)
REQUESTING REFILL ON PREDNISONE 5MG   PHARM: [facility] [address]  Cb#[phone]

## 2016-06-04 ENCOUNTER — Telehealth: Payer: Self-pay | Admitting: Radiology

## 2016-06-04 LAB — CBC WITH DIFFERENTIAL/PLATELET
BASOS ABS: 0 {cells}/uL (ref 0–200)
Basophils Relative: 0 %
EOS PCT: 0 %
Eosinophils Absolute: 0 cells/uL — ABNORMAL LOW (ref 15–500)
HEMATOCRIT: 39.2 % (ref 35.0–45.0)
HEMOGLOBIN: 13.2 g/dL (ref 11.7–15.5)
LYMPHS ABS: 648 {cells}/uL — AB (ref 850–3900)
Lymphocytes Relative: 9 %
MCH: 34.3 pg — AB (ref 27.0–33.0)
MCHC: 33.7 g/dL (ref 32.0–36.0)
MCV: 101.8 fL — ABNORMAL HIGH (ref 80.0–100.0)
MONO ABS: 720 {cells}/uL (ref 200–950)
MPV: 9.2 fL (ref 7.5–12.5)
Monocytes Relative: 10 %
NEUTROS ABS: 5832 {cells}/uL (ref 1500–7800)
Neutrophils Relative %: 81 %
Platelets: 300 10*3/uL (ref 140–400)
RBC: 3.85 MIL/uL (ref 3.80–5.10)
RDW: 16.3 % — ABNORMAL HIGH (ref 11.0–15.0)
WBC: 7.2 10*3/uL (ref 3.8–10.8)

## 2016-06-04 LAB — COMPLETE METABOLIC PANEL WITH GFR
ALBUMIN: 4.1 g/dL (ref 3.6–5.1)
ALK PHOS: 33 U/L (ref 33–130)
ALT: 33 U/L — ABNORMAL HIGH (ref 6–29)
AST: 33 U/L (ref 10–35)
BILIRUBIN TOTAL: 0.5 mg/dL (ref 0.2–1.2)
BUN: 13 mg/dL (ref 7–25)
CALCIUM: 9.6 mg/dL (ref 8.6–10.4)
CO2: 27 mmol/L (ref 20–31)
Chloride: 101 mmol/L (ref 98–110)
Creat: 1.22 mg/dL — ABNORMAL HIGH (ref 0.60–0.93)
GFR, Est African American: 50 mL/min — ABNORMAL LOW (ref >=60)
GFR, Est Non African American: 44 mL/min — ABNORMAL LOW (ref >=60)
GLUCOSE: 71 mg/dL (ref 65–99)
Potassium: 4.9 mmol/L (ref 3.5–5.3)
SODIUM: 139 mmol/L (ref 135–146)
TOTAL PROTEIN: 6.3 g/dL (ref 6.1–8.1)

## 2016-06-04 NOTE — Telephone Encounter (Signed)
Called pt to see if she can come in today or tomorrow to discuss change of meds , since her kidney function has declined. She will be here tomorrow at 9 am.

## 2016-06-04 NOTE — Progress Notes (Signed)
Office Visit Note  Patient: Sherri [name]             Date of Birth: 11-Jul-1940           MRN: [id number]             PCP: Binnie Rail, MD Referring: Binnie Rail, MD Visit Date: 06/05/2016 Occupation: Retired    Subjective:  Right hip pain   History of Present Illness: Sherri [name] is a 76 y.o. female with history of rheumatoid arthritis and osteoarthritis overlap. She states that her arthritis is quite well controlled currently. She had right trochanteric area injection but approximately 2 months ago which helped. Now she is having pain in the right hip in a different area. She had after I cataract surgery on January 2 and was on antibiotic drops and prednisone drops for that. A week later she had right eye cataract surgery and is on prednisone drops taper currently. She states for the last 2 months she's been having dark urine with some odor and at. She's been also having increased frequency of urination. She had seen Dr. Jeffie Pollock about 3 months ago and had a urinalysis which showed elevated WBC count but no urinary tract infection she was not given any antibiotics. She feels her arthritis is very well controlled on combination of Xeljanz and methotrexate which she's taking 6 tablets per week now. Patient reports that she had 3 is skin cancers removed in the last months.  Activities of Daily Living:  Patient reports morning stiffness for 2 hours.   Patient Reports nocturnal pain.  Difficulty dressing/grooming: Reports Difficulty climbing stairs: Reports Difficulty getting out of chair: Denies Difficulty using hands for taps, buttons, cutlery, and/or writing: Denies   Review of Systems  Constitutional: Negative for fatigue, night sweats, weight gain, weight loss and weakness.  HENT: Positive for mouth dryness. Negative for mouth sores, trouble swallowing, trouble swallowing and nose dryness.   Eyes: Positive for dryness. Negative for pain, redness and visual disturbance.    Respiratory: Negative for cough, shortness of breath and difficulty breathing.   Cardiovascular: Negative for chest pain, palpitations, hypertension, irregular heartbeat and swelling in legs/feet.  Gastrointestinal: Positive for constipation. Negative for blood in stool and diarrhea.  Endocrine: Positive for increased urination.  Genitourinary: Negative for vaginal dryness.  Musculoskeletal: Positive for arthralgias, joint pain, myalgias, morning stiffness and myalgias. Negative for joint swelling, muscle weakness and muscle tenderness.  Skin: Negative for color change, rash, hair loss, skin tightness, ulcers and sensitivity to sunlight.  Allergic/Immunologic: Negative for susceptible to infections.  Neurological: Negative for dizziness, memory loss and night sweats.  Hematological: Negative for swollen glands.  Psychiatric/Behavioral: Negative for depressed mood and sleep disturbance. The patient is not nervous/anxious.     PMFS History:  Patient Active Problem List   Diagnosis Date Noted  . Right hip pain 04/08/2016  . Right knee pain 02/25/2016  . Dyspepsia 04/05/2015  . Numbness 11/14/2014  . Biceps tendon tear 10/10/2014  . Insomnia 10/10/2014  . Gait disturbance 06/13/2014  . 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 OP (osteoporosis) 01/18/2014  . Other long term (current) drug therapy 01/18/2014  . Other dysphagia 12/29/2013  . Hoarseness 12/29/2013  . 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 Personal history of colonic polyps 07/03/2010  . Nocturia 05/28/2010  . THYROID NODULE 05/17/2008  . Multiple sclerosis ([place]) 05/17/2008  . Chronic fatigue 03/30/2007  . Hyperlipidemia 08/20/2006  .  Essential hypertension 08/20/2006  . Rheumatoid arthritis ([place]) 08/20/2006  . CERVICAL CANCER, HX OF 08/20/2006  . GASTRIC ULCER, HX OF 08/20/2006    Past Medical History:  Diagnosis Date  . Anxiety    takes Valium daily as  needed  . Bruises easily    d/t meds  . Cancer ([place])    basal cell ca, in situ- uterine   . Cataracts, bilateral   . Chronic back pain   . Dizziness    r/t to meds  . GERD (gastroesophageal reflux disease)    no meds on a regular basis but will take Tums if needed  . Headache(784.0)    r/t neck issues  . History of bronchitis 6-58yrs ago  . History of colon polyps    benign  . Hyperlipidemia    takes Atorvastatin on Mondays and Fridays  . Hypertension    takes Ramipril daily  . Joint pain   . Joint swelling   . Multiple sclerosis ([place])   . Neuromuscular disorder ([place])    Dr. Park Liter- in [place]- [facility] follows [facility]  . Nocturia   . OA (osteoarthritis)    Dr Jani Gravel weekly  . PONV (postoperative nausea and vomiting)    trouble urinating after surgery in 2014  . Rheumatoid arthritis(714.0)    Dr Estanislado Pandy takes [name] daily  . Skin cancer   . Urinary retention    sees Dr.Wrenn about 2 times a yr    Family History  Problem Relation Age of Onset  . Cancer Mother     pancreatic  . Diabetes Mother   . Heart disease Father     Rheumatic  . Cancer Father     ? stomach  . Cancer Sister     stomach  . Cancer Maternal Aunt     X 4; ? primary  . Heart disease Paternal Aunt   . Cancer Maternal Grandmother     cervical  . Colon cancer      Aunts  . Multiple sclerosis Daughter   . Hypertension Neg Hx   . Stroke Neg Hx    Past Surgical History:  Procedure Laterality Date  . ABDOMINAL HYSTERECTOMY     1985  . APPENDECTOMY     with TAH  . BACK SURGERY     several  . CARPAL TUNNEL RELEASE    . CERVICAL FUSION     Dr Joya Salm  . CERVICAL FUSION  12/31/2012   Dr Joya Salm  . CHEST TUBE INSERTION     for traumatic Pneumothorax  . COLONOSCOPY  07/16/2010   normal   . COLONOSCOPY W/ POLYPECTOMY  1997   negative since; Dr Deatra Ina  . ESOPHAGOGASTRODUODENOSCOPY  07/16/2010   normal  . LUMBAR FUSION     Dr Joya Salm  . NASAL SINUS SURGERY    . POSTERIOR CERVICAL  FUSION/FORAMINOTOMY N/A 12/31/2012   Procedure: POSTERIOR LATERAL CERVICAL FUSION/FORAMINOTOMY LEVEL 1 CERVICAL THREE-FOUR WITH LATERAL MASS SCREWS;  Surgeon: Floyce Stakes, MD;  Location: [facility];  Service: Neurosurgery;  Laterality: N/A;  . SKIN BIOPSY    . THYROID SURGERY     R lobe removed- 1972  . TOTAL HIP ARTHROPLASTY  12/22/2011   Procedure: TOTAL HIP ARTHROPLASTY;  Surgeon: Kerin Salen, MD;  Location: [facility];  Service: Orthopedics;  Laterality: Left;   Social History   Social History Narrative  . No narrative on file     Objective: Vital Signs: BP 100/60   Pulse 76   Resp  14   Ht 5\' 3"  (1.6 m)   Wt 112 lb (50.8 kg)   BMI 19.84 kg/m    Physical Exam  Constitutional: She is oriented to person, place, and time. She appears well-developed and well-nourished.  HENT:  Head: Normocephalic and atraumatic.  Eyes: Conjunctivae and EOM are normal.  Neck: Normal range of motion.  Cardiovascular: Normal rate, regular rhythm, normal heart sounds and intact distal pulses.   Pulmonary/Chest: Effort normal and breath sounds normal.  Abdominal: Soft. Bowel sounds are normal.  Lymphadenopathy:    She has no cervical adenopathy.  Neurological: She is alert and oriented to person, place, and time.  Skin: Skin is warm and dry. Capillary refill takes less than 2 seconds.  Psychiatric: She has a normal mood and affect. Her behavior is normal.  Nursing note and vitals reviewed.    Musculoskeletal Exam: C-spine limited range of motion and thoracic lumbar spine good range of motion. Shoulder joints elbow joints wrist joints are good range of motion. She has synovial thickening over bilateral MCP joints she has thickening of PIP/DIP joints bilaterally with no synovitis. Hip joints knee joints ankles MTPs PIPs with good range of motion with no synovitis.  CDAI Exam: CDAI Homunculus Exam:   Joint Counts:  CDAI Tender Joint count: 0 CDAI Swollen Joint count: 0  Global Assessments:    Patient Global Assessment: 2 Provider Global Assessment: 2  CDAI Calculated Score: 4    Investigation: No additional findings.   Imaging: No results found.  Speciality Comments: No specialty comments available.  CBC    Component Value Date/Time   WBC 7.2 06/03/2016 1101   RBC 3.85 06/03/2016 1101   HGB 13.2 06/03/2016 1101   HCT 39.2 06/03/2016 1101   PLT 300 06/03/2016 1101   MCV 101.8 (H) 06/03/2016 1101   MCH 34.3 (H) 06/03/2016 1101   MCHC 33.7 06/03/2016 1101   RDW 16.3 (H) 06/03/2016 1101   LYMPHSABS 648 (L) 06/03/2016 1101   MONOABS 720 06/03/2016 1101   EOSABS 0 (L) 06/03/2016 1101   BASOSABS 0 06/03/2016 1101   CMP     Component Value Date/Time   NA 139 06/03/2016 1101   NA 136 (A) 02/22/2015   NA 136 02/22/2015   K 4.9 06/03/2016 1101   K 4.4 02/22/2015   CL 101 06/03/2016 1101   CL 98 02/22/2015   CO2 27 06/03/2016 1101   CO2 30 02/22/2015   GLUCOSE 71 06/03/2016 1101   BUN 13 06/03/2016 1101   BUN 15 02/22/2015   BUN 15 02/22/2015   CREATININE 1.22 (H) 06/03/2016 1101   CALCIUM 9.6 06/03/2016 1101   CALCIUM 9.6 02/22/2015   PROT 6.3 06/03/2016 1101   PROT 6.6 02/22/2015   ALBUMIN 4.1 06/03/2016 1101   ALBUMIN 4.3 02/22/2015   AST 33 06/03/2016 1101   AST 24 02/22/2015   ALT 33 (H) 06/03/2016 1101   ALT 18 02/22/2015   ALKPHOS 33 06/03/2016 1101   ALKPHOS 34 02/22/2015   BILITOT 0.5 06/03/2016 1101   BILITOT 0.5 02/22/2015   GFRNONAA 44 (L) 06/03/2016 1101   GFRAA 50 (L) 06/03/2016 1101    Procedures:  Trigger Point Inj Date/Time: 06/05/2016 9:34 AM Performed by: Bo Merino Authorized by: Bo Merino   Consent Given by:  Patient Site marked: the procedure site was marked   Timeout: prior to procedure the correct patient, procedure, and site was verified   Indications:  Muscle spasm and pain Total # of Trigger Points:  1 Location: neck and back   Location comment:  Right piriformis Needle Size:  27 G Approach:   Dorsal Medications #1:  1 mL lidocaine 1 %; 40 mg triamcinolone acetonide 40 MG/ML Patient tolerance:  Patient tolerated the procedure well with no immediate complications   Allergies: Meperidine hcl; Penicillins; Azathioprine; Darvon [propoxyphene hcl]; and Ezetimibe-simvastatin   Assessment / Plan:     Visit Diagnoses: Rheumatoid arthritis involving multiple sites with positive rheumatoid factor ([place]): She is doing really well as regards to her rheumatoid arthritis she has no synovitis currently .  High risk medication use: She is on Xeljanz 11 mg by mouth daily, methotrexate 6 tablets by mouth every week folic acid 2 mg by mouth daily and prednisone 5 mg by mouth daily. Her most recent labs showed decrease in her GFR. Creatine discussion regarding that. I will reduce her methotrexate again to 4 tablets by mouth every week her previous dose. And check her labs in 3 weeks.  She has some right piriformis pain: After different treatment options were discussed. Was prepped in sterile fashion and injected with cortisone as described above. Side effects were discussed.  Urinary frequency: She complains of dysuria and frequency. We'll check UA and urine culture today  Status post bilateral cataract surgery. She is on prednisone eyedrops.  Other fatigue: Some fatigue related to insomnia.  Primary insomnia  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: She has limited range of motion  Age-related osteoporosis without current pathological fracture: She is on Reclast which she is tolerating well.  Multiple sclerosis ([place])  Hyperlipidemia, unspecified hyperlipidemia type  Essential hypertension  Depression with anxiety    Orders: Orders Placed This Encounter  Procedures  . Trigger Point Injection  . Urine culture  . Urinalysis, Routine w reflex microscopic   Meds ordered this encounter  Medications  . predniSONE (DELTASONE) 1 MG tablet    Sig: Take 4 tablets (4 mg total) by mouth  daily with breakfast.    Dispense:  360 tablet    Refill:  0    Face-to-face time spent with patient was 30 minutes. 50% of time was spent in counseling and coordination of care.  Follow-Up Instructions: Return in about 3 months (around 09/03/2016) for Rheumatoid arthritis.   Bo Merino, MD  Note - This record has been created using Editor, commissioning.  Chart creation errors have been sought, but may not always  have been located. Such creation errors do not reflect on  the standard of medical care.

## 2016-06-05 ENCOUNTER — Ambulatory Visit (INDEPENDENT_AMBULATORY_CARE_PROVIDER_SITE_OTHER): Payer: Medicare Other | Admitting: Rheumatology

## 2016-06-05 ENCOUNTER — Encounter: Payer: Self-pay | Admitting: Rheumatology

## 2016-06-05 VITALS — BP 100/60 | HR 76 | Resp 14 | Ht 63.0 in | Wt 112.0 lb

## 2016-06-05 DIAGNOSIS — G35 Multiple sclerosis: Secondary | ICD-10-CM

## 2016-06-05 DIAGNOSIS — M810 Age-related osteoporosis without current pathological fracture: Secondary | ICD-10-CM

## 2016-06-05 DIAGNOSIS — M0579 Rheumatoid arthritis with rheumatoid factor of multiple sites without organ or systems involvement: Secondary | ICD-10-CM | POA: Diagnosis not present

## 2016-06-05 DIAGNOSIS — I10 Essential (primary) hypertension: Secondary | ICD-10-CM | POA: Diagnosis not present

## 2016-06-05 DIAGNOSIS — M791 Myalgia: Secondary | ICD-10-CM | POA: Diagnosis not present

## 2016-06-05 DIAGNOSIS — F418 Other specified anxiety disorders: Secondary | ICD-10-CM

## 2016-06-05 DIAGNOSIS — R5383 Other fatigue: Secondary | ICD-10-CM

## 2016-06-05 DIAGNOSIS — M7918 Myalgia, other site: Secondary | ICD-10-CM

## 2016-06-05 DIAGNOSIS — F5101 Primary insomnia: Secondary | ICD-10-CM | POA: Diagnosis not present

## 2016-06-05 DIAGNOSIS — E785 Hyperlipidemia, unspecified: Secondary | ICD-10-CM

## 2016-06-05 DIAGNOSIS — M4312 Spondylolisthesis, cervical region: Secondary | ICD-10-CM

## 2016-06-05 DIAGNOSIS — Z79899 Other long term (current) drug therapy: Secondary | ICD-10-CM | POA: Diagnosis not present

## 2016-06-05 DIAGNOSIS — R350 Frequency of micturition: Secondary | ICD-10-CM

## 2016-06-05 MED ORDER — PREDNISONE 1 MG PO TABS: 4.0000 mg | ORAL_TABLET | Freq: Every day | ORAL | 0 refills | Status: DC

## 2016-06-05 MED ORDER — TRIAMCINOLONE ACETONIDE 40 MG/ML IJ SUSP
40.0000 mg | INTRAMUSCULAR | Status: AC | PRN
  Administered 2016-06-05: 40 mg via INTRAMUSCULAR

## 2016-06-05 MED ORDER — LIDOCAINE HCL 1 % IJ SOLN
1.0000 mL | INTRAMUSCULAR | Status: AC | PRN
  Administered 2016-06-05: 1 mL

## 2016-06-05 NOTE — Patient Instructions (Addendum)
Reduce methotrexate to 4 per week Return in 3 weeks for a repeat lab to check your kidney functions Taper prednisone as directed by Dr Estanislado Pandy

## 2016-06-06 LAB — URINALYSIS, ROUTINE W REFLEX MICROSCOPIC
Bilirubin Urine: NEGATIVE
Glucose, UA: NEGATIVE
HGB URINE DIPSTICK: NEGATIVE
KETONES UR: NEGATIVE
NITRITE: NEGATIVE
PH: 7 (ref 5.0–8.0)
Protein, ur: NEGATIVE
Specific Gravity, Urine: 1.006 (ref 1.001–1.035)

## 2016-06-06 LAB — URINALYSIS, MICROSCOPIC ONLY
Bacteria, UA: NONE SEEN [HPF]
CASTS: NONE SEEN [LPF]
Crystals: NONE SEEN [HPF]
Yeast: NONE SEEN [HPF]

## 2016-06-06 NOTE — Progress Notes (Signed)
UA negative

## 2016-06-07 LAB — URINE CULTURE

## 2016-06-08 NOTE — Progress Notes (Signed)
Urine was not clean catch. If patient is symptomatic she should see PCP for urine culture

## 2016-06-10 DIAGNOSIS — H2511 Age-related nuclear cataract, right eye: Secondary | ICD-10-CM | POA: Diagnosis not present

## 2016-06-10 DIAGNOSIS — H25811 Combined forms of age-related cataract, right eye: Secondary | ICD-10-CM | POA: Diagnosis not present

## 2016-06-25 ENCOUNTER — Telehealth: Payer: Self-pay | Admitting: Internal Medicine

## 2016-06-25 NOTE — Telephone Encounter (Signed)
Attempted to call patient to schedule awv. Patient did not answer. Will attempt to call patient at a later time.

## 2016-07-01 ENCOUNTER — Encounter: Payer: Self-pay | Admitting: Neurology

## 2016-07-01 ENCOUNTER — Ambulatory Visit (INDEPENDENT_AMBULATORY_CARE_PROVIDER_SITE_OTHER): Payer: Medicare Other | Admitting: Neurology

## 2016-07-01 VITALS — BP 110/66 | HR 82 | Resp 16 | Ht 63.0 in | Wt 115.5 lb

## 2016-07-01 DIAGNOSIS — R269 Unspecified abnormalities of gait and mobility: Secondary | ICD-10-CM | POA: Diagnosis not present

## 2016-07-01 DIAGNOSIS — R5382 Chronic fatigue, unspecified: Secondary | ICD-10-CM

## 2016-07-01 DIAGNOSIS — Z79899 Other long term (current) drug therapy: Secondary | ICD-10-CM

## 2016-07-01 DIAGNOSIS — R200 Anesthesia of skin: Secondary | ICD-10-CM | POA: Diagnosis not present

## 2016-07-01 DIAGNOSIS — G35 Multiple sclerosis: Secondary | ICD-10-CM | POA: Diagnosis not present

## 2016-07-01 DIAGNOSIS — M0579 Rheumatoid arthritis with rheumatoid factor of multiple sites without organ or systems involvement: Secondary | ICD-10-CM

## 2016-07-01 MED ORDER — OXYCODONE HCL 10 MG PO TABS: 10.0000 mg | ORAL_TABLET | Freq: Three times a day (TID) | ORAL | 0 refills | Status: DC | PRN

## 2016-07-01 MED ORDER — DIAZEPAM 5 MG PO TABS: 5.0000 mg | ORAL_TABLET | Freq: Three times a day (TID) | ORAL | 0 refills | Status: DC | PRN

## 2016-07-01 MED ORDER — TRAMADOL HCL 50 MG PO TABS: 50.0000 mg | ORAL_TABLET | Freq: Four times a day (QID) | ORAL | 3 refills | Status: DC

## 2016-07-01 NOTE — Progress Notes (Signed)
[facility]  PATIENT: Sherri [name] DOB: 11-Jan-1941  REFERRING CLINICIAN: Unice Cobble  HISTORY FROM: patient REASON FOR VISIT: Multiple sclerosis   HISTORICAL  CHIEF COMPLAINT:  Chief Complaint  Patient presents with  . Multiple Sclerosis    She continues off of MS med as she is on Xeljanz, Prednisone and Methotrexate for RA.  Sts. her rheumatologist tried increasing Methotrexate to 2.5mg  6 times daily, but this caused renal labs to be abnormal, so it was decreased back to qid./fim  . Rheumatoid Arthritis    HISTORY OF PRESENT ILLNESS:  Sherri [name] is a 76 year old woman who was diagnosed with relapsing remitting MS in 2010.     MS:   She feels her MS is stable.    She also has RA and is on Xeljanz and prednisone.    She is not on any DMT for her MS.     She denies any recent exacerbation.     Her right leg sometimes gives out on her but the changes have been gradual.    RA:   She remains on Xeljanz and methotrexate.   MTX dose reduced as higher does may have affected renal function and dose was reduced.     Gait/strength:    She has mild gait disturbance.   Her right leg gives out at times, usually fairly randomly.   She has some hip pain there but xray did not show anything major.  She also has mild right knee pain.  She drags right foot at times causing her to stumble.  At times, she feels off balanced at times.     She feels gait is not bad enough to use a cane.       Sensation:   She has mild numbness in the lower leg, left = right in the feet mostly.    She has left facial numbness   in V2 and V3 > V1.    No arm numbness.     Bladder:   She has urinary frequency for many years. U/S shows she does not completely empty.      She has no recent UTI's,      Vision:   She denies MS related visual problems.     Fatigue/sleep:     Fatigue is mild most days but worse a day after 'overdoing it'.    She is sleeping well with Valium 5 mg and Oxycodone 10 mg  at bedtime .   He sleeps better with her pain under better control.    She is active, walking daily.    Mood/cognition:     She notes mood is dping ok.   She has had some family issues with her daughter who also has MS    She does not note that she has any significant cognitive changes.      Pain:   For her neuromuscular and rheumatoid pain, she takes oxycodone 10 mg po up to tid but usually just one at bedtime.   She is also on valium 5 mg po tid.   We discussed that the combination affects breathing more than either med on its own so not to take more than one oxycodone pill at night.  Withput the oxycodone, she wakes up multiple times at night.    A knee injection last visit resolved her knee pain on the right.    MS History:   When she was in her late 49s she had an episode of right-sided numbness and  she lost the use of her right arm for a couple of weeks. At that time, she was told that she might have MS. Over the next 30 or 40 years she had several more similar but milder episodes. In 2010, she had an MRI of the cervical spine (for arm pain) and it showed foci c/w multiple sclerosis. An MRI of the brain was also performed  shortly thereafter showing white matter lesions consistent with the diagnosis.  Initially, she was placed on Avonex. She also has rheumatoid arthritis and was placed on methotrexate and then a combination of methotrexate with [name]. We decided to stop the Avonex as she probably would get some benefit from those medications for the MS as well. For the most part her MS has been stable over the past 5 years.However, early 2016,  she had numbness in the left face and weakness in her legs   REVIEW OF SYSTEMS:  Constitutional: No fevers, chills, sweats, or change in appetite Eyes: No visual changes, double vision, eye pain Ear, nose and throat: No hearing loss, ear pain, nasal congestion, sore throat Cardiovascular: No chest pain, palpitations Respiratory:  No shortness of breath  at rest or with exertion.   No wheezes GastrointestinaI: No nausea, vomiting, diarrhea, abdominal pain, fecal incontinence Genitourinary:  as above. Musculoskeletal:  No neck pain, back pain Integumentary: No rash, pruritus, skin lesions Neurological: as above Psychiatric: he has had a lot of family stress leading to some depression and more anxiety. Endocrine: No palpitations, diaphoresis, change in appetite, change in weigh or increased thirst Hematologic/Lymphatic:  No anemia, purpura, petechiae. Allergic/Immunologic: No itchy/runny eyes, nasal congestion, recent allergic reactions, rashes  ALLERGIES: Allergies  Allergen Reactions  . Meperidine Hcl Shortness Of Breath  . Penicillins Hives  . Azathioprine Other (See Comments)    Weakness  . Darvon [Propoxyphene Hcl]     Lowered respirations greatly  . Ezetimibe-Simvastatin Nausea And Vomiting    HOME MEDICATIONS: Outpatient Medications Prior to Visit  Medication Sig Dispense Refill  . atorvastatin (LIPITOR) 10 MG tablet Take 1 tablet by mouth on Mondays and Friday 27 tablet 2  . Biotin 5000 MCG TABS Take 5,000 mcg by mouth daily.     . Calcium Carbonate-Vitamin D (CALCIUM + D PO) Take 1 tablet by mouth daily. 1200 mg daily    . ciclopirox (PENLAC) 8 % solution Apply topically at bedtime. Apply over nail and surrounding skin. Apply daily over previous coat. After seven (7) days, may remove with alcohol and continue cycle. 6.6 mL 0  . Cyanocobalamin (VITAMIN B-12 PO) Take 1 tablet by mouth daily.    . folic acid (FOLVITE) 1 MG tablet Take 1 mg by mouth 2 (two) times daily.    . furosemide (LASIX) 20 MG tablet TAKE 1 TABLET BY MOUTH EVERY DAY 90 tablet 1  . methotrexate (RHEUMATREX) 2.5 MG tablet Take 4 tablets (10 mg total) by mouth once a week. Caution:Chemotherapy. Protect from light. (Patient taking differently: Take 15 mg by mouth once a week. Caution:Chemotherapy. Protect from light.) 48 tablet 0  . moxifloxacin (VIGAMOX) 0.5 %  ophthalmic solution PLACE 1 DROP INTO OPERATIVE EYE 4 TIMES DAILY STARTING 2 DAYS BEFORE, AND 2 DROPS MORNING OF SURGERY  0  . Omega-3 Fatty Acids (OMEGA 3 PO) Take 1,000 mg by mouth daily.     . prednisoLONE acetate (PRED FORTE) 1 % ophthalmic suspension PLACE 1 DROP INTO OPERATIVE EYE 4 TIMES DAILY  0  . predniSONE (DELTASONE) 1 MG tablet Take  4 tablets (4 mg total) by mouth daily with breakfast. 360 tablet 0  . Pyridoxine HCl (VITAMIN B-6 PO) Take 1 tablet by mouth daily.    . ramipril (ALTACE) 5 MG capsule Take 1 capsule (5 mg total) by mouth daily with breakfast. 90 capsule 2  . tiZANidine (ZANAFLEX) 4 MG capsule Take 4 mg by mouth 3 (three) times daily as needed for muscle spasms.    . Tofacitinib Citrate (XELJANZ XR) 11 MG TB24 Take 11 mg by mouth daily. 60 tablet 0  . diazepam (VALIUM) 5 MG tablet Take 1 tablet (5 mg total) by mouth every 8 (eight) hours as needed for anxiety. 90 tablet 0  . Oxycodone HCl 10 MG TABS Take 1 tablet (10 mg total) by mouth 3 (three) times daily as needed. 90 tablet 0  . traMADol (ULTRAM) 50 MG tablet TAKE 1 TABLET BY MOUTH 4 TIMES A DAY 120 tablet 3   No facility-administered medications prior to visit.     PAST MEDICAL HISTORY: Past Medical History:  Diagnosis Date  . Anxiety    takes Valium daily as needed  . Bruises easily    d/t meds  . Cancer ([place])    basal cell ca, in situ- uterine   . Cataracts, bilateral   . Chronic back pain   . Dizziness    r/t to meds  . GERD (gastroesophageal reflux disease)    no meds on a regular basis but will take Tums if needed  . Headache(784.0)    r/t neck issues  . History of bronchitis 6-44yrs ago  . History of colon polyps    benign  . Hyperlipidemia    takes Atorvastatin on Mondays and Fridays  . Hypertension    takes Ramipril daily  . Joint pain   . Joint swelling   . Multiple sclerosis ([place])   . Neuromuscular disorder ([place])    Dr. Park Liter- in [place]- [facility] follows M.S.  . Nocturia   . OA  (osteoarthritis)    Dr Jani Gravel weekly  . PONV (postoperative nausea and vomiting)    trouble urinating after surgery in 2014  . Rheumatoid arthritis(714.0)    Dr Estanislado Pandy takes [name] daily  . Skin cancer   . Urinary retention    sees Dr.Wrenn about 2 times a yr    PAST SURGICAL HISTORY: Past Surgical History:  Procedure Laterality Date  . ABDOMINAL HYSTERECTOMY     1985  . APPENDECTOMY     with TAH  . BACK SURGERY     several  . CARPAL TUNNEL RELEASE    . CERVICAL FUSION     Dr Joya Salm  . CERVICAL FUSION  12/31/2012   Dr Joya Salm  . CHEST TUBE INSERTION     for traumatic Pneumothorax  . COLONOSCOPY  07/16/2010   normal   . COLONOSCOPY W/ POLYPECTOMY  1997   negative since; Dr Deatra Ina  . ESOPHAGOGASTRODUODENOSCOPY  07/16/2010   normal  . LUMBAR FUSION     Dr Joya Salm  . NASAL SINUS SURGERY    . POSTERIOR CERVICAL FUSION/FORAMINOTOMY N/A 12/31/2012   Procedure: POSTERIOR LATERAL CERVICAL FUSION/FORAMINOTOMY LEVEL 1 CERVICAL THREE-FOUR WITH LATERAL MASS SCREWS;  Surgeon: Floyce Stakes, MD;  Location: [facility];  Service: Neurosurgery;  Laterality: N/A;  . SKIN BIOPSY    . THYROID SURGERY     R lobe removed- 1972  . TOTAL HIP ARTHROPLASTY  12/22/2011   Procedure: TOTAL HIP ARTHROPLASTY;  Surgeon: Kerin Salen, MD;  Location:  [facility];  Service: Orthopedics;  Laterality: Left;    FAMILY HISTORY: Family History  Problem Relation Age of Onset  . Cancer Mother     pancreatic  . Diabetes Mother   . Heart disease Father     Rheumatic  . Cancer Father     ? stomach  . Cancer Sister     stomach  . Cancer Maternal Aunt     X 4; ? primary  . Heart disease Paternal Aunt   . Cancer Maternal Grandmother     cervical  . Colon cancer      Aunts  . Multiple sclerosis Daughter   . Hypertension Neg Hx   . Stroke Neg Hx     SOCIAL HISTORY:  Social History   Social History  . Marital status: Married    Spouse name: N/A  . Number of children: N/A  . Years of  education: N/A   Occupational History  . retired    Social History Main Topics  . Smoking status: Former Smoker    Quit date: 05/26/1981  . Smokeless tobacco: Never Used     Comment: Smoked [phone], up to 2.5 ppd  . Alcohol use 0.0 oz/week     Comment: 2-3 beers every day 2%  . Drug use: No  . Sexual activity: Yes   Other Topics Concern  . Not on file   Social History Narrative  . No narrative on file     PHYSICAL EXAM  Vitals:   07/01/16 1439  BP: 110/66  Pulse: 82  Resp: 16  Weight: 115 lb 8 oz (52.4 kg)  Height: 5\' 3"  (1.6 m)    Body mass index is 20.46 kg/m.   General: The patient is well-developed and well-nourished and in no acute distress  Musculoskeletal:   Right biceps bulge from ruptured tendon.   Mildly tender over medial right knee.   Good knee ROM and currently nontender  Skin: Extremities are without rash or edema.  Neurologic Exam  Mental status: The patient is alert and oriented x 3 at the time of the examination. The patient has apparent normal recent and remote memory, with an apparently normal attention span and concentration ability.   Speech is normal.  CN:  EOMI, reduced facial sensation to soft touch left V2 and V3 .Facial strength is normal.  Trapezius and sternocleidomastoid strength is normal. No dysarthria is noted.   No obvious hearing deficits are noted.  Motor:  Muscle bulk is decreased in ulnar hand muscles on her right and right biceps bulges (has tendon tear).   Tone is mildly increased in her legs, left > right. Strength appears 5 / 5 in all 4 extremities except 4/5 ulnar innervated intrinsic hand muscles on the right.  She has right Tinel's sign at the elbow (ulnar)  Sensory: Sensory testing is intact to touch and vibration in the arms and legs.  Coordination: Cerebellar testing reveals good rapid alternating movements in hands and feet  Gait and station: Station is stable. Gait was mildly wide and arthritic. She has  moderately wide tandem walk.  Romberg is negative.   Reflexes: Deep tendon reflexes are symmetric and normal in legs.     DIAGNOSTIC DATA (LABS, IMAGING, TESTING) - I reviewed patient records, labs, notes, testing and imaging myself where available.  Lab Results  Component Value Date   WBC 7.2 06/03/2016   HGB 13.2 06/03/2016   HCT 39.2 06/03/2016   MCV 101.8 (H) 06/03/2016   PLT 300  06/03/2016      Component Value Date/Time   NA 139 06/03/2016 1101   NA 136 (A) 02/22/2015   NA 136 02/22/2015   K 4.9 06/03/2016 1101   K 4.4 02/22/2015   CL 101 06/03/2016 1101   CL 98 02/22/2015   CO2 27 06/03/2016 1101   CO2 30 02/22/2015   GLUCOSE 71 06/03/2016 1101   BUN 13 06/03/2016 1101   BUN 15 02/22/2015   BUN 15 02/22/2015   CREATININE 1.22 (H) 06/03/2016 1101   CALCIUM 9.6 06/03/2016 1101   CALCIUM 9.6 02/22/2015   PROT 6.3 06/03/2016 1101   PROT 6.6 02/22/2015   ALBUMIN 4.1 06/03/2016 1101   ALBUMIN 4.3 02/22/2015   AST 33 06/03/2016 1101   AST 24 02/22/2015   ALT 33 (H) 06/03/2016 1101   ALT 18 02/22/2015   ALKPHOS 33 06/03/2016 1101   ALKPHOS 34 02/22/2015   BILITOT 0.5 06/03/2016 1101   BILITOT 0.5 02/22/2015   GFRNONAA 44 (L) 06/03/2016 1101   GFRAA 50 (L) 06/03/2016 1101   Lab Results  Component Value Date   CHOL 205 (H) 04/14/2016   HDL 82.90 04/14/2016   LDLCALC 98 04/14/2016   LDLDIRECT 122.9 11/15/2012   TRIG 116.0 04/14/2016   CHOLHDL 2 04/14/2016   Lab Results  Component Value Date   HGBA1C 5.6 10/24/2013   No results found for: VITAMINB12 Lab Results  Component Value Date   TSH 4.11 04/14/2016        ASSESSMENT AND PLAN  Multiple sclerosis ([place]) - Plan: Comprehensive metabolic panel  Rheumatoid arthritis involving multiple sites with positive rheumatoid factor ([place]) - Plan: Comprehensive metabolic panel  Chronic fatigue  Gait disturbance  Numbness  Other long term (current) drug therapy   1.   Continue Xeljanz, MTX and  prednisone for RA per Dr. Estanislado Pandy.   We will recheck labs.      If she has another relapse or if she does not do well with her RA, rituximab could be a good choice with efficacy for both diseases. 2.   Continue the oxycodone and valium at night to help her pain and sleep 3.    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 Noted in the past and surgery was discussed with her but she did not want to proceed. If this worsens further, we would need to reconsider surgery and get a nerve conduction study to better characterize.  4.   return to see [place] in 4-5 months or sooner if she has new or worsening neurologic symptoms.    Richard A. Felecia Shelling, MD, PhD [id number], [id number] PM Certified in Neurology, Clinical Neurophysiology, Sleep Medicine, Pain Medicine and Neuroimaging  [facility] [address], [place], [place] [address] [phone]

## 2016-07-02 ENCOUNTER — Telehealth: Payer: Self-pay | Admitting: *Deleted

## 2016-07-02 ENCOUNTER — Encounter: Payer: Self-pay | Admitting: Neurology

## 2016-07-02 LAB — COMPREHENSIVE METABOLIC PANEL
A/G RATIO: 2 (ref 1.2–2.2)
ALK PHOS: 45 IU/L (ref 39–117)
ALT: 36 IU/L — AB (ref 0–32)
AST: 32 IU/L (ref 0–40)
Albumin: 4.5 g/dL (ref 3.5–4.8)
BUN/Creatinine Ratio: 10 — ABNORMAL LOW (ref 12–28)
BUN: 9 mg/dL (ref 8–27)
Bilirubin Total: 0.3 mg/dL (ref 0.0–1.2)
CO2: 26 mmol/L (ref 18–29)
Calcium: 10 mg/dL (ref 8.7–10.3)
Chloride: 99 mmol/L (ref 96–106)
Creatinine, Ser: 0.92 mg/dL (ref 0.57–1.00)
GFR calc Af Amer: 71 mL/min/{1.73_m2} (ref >59)
GFR calc non Af Amer: 62 mL/min/{1.73_m2} (ref >59)
GLOBULIN, TOTAL: 2.2 g/dL (ref 1.5–4.5)
Glucose: 94 mg/dL (ref 65–99)
POTASSIUM: 5.1 mmol/L (ref 3.5–5.2)
SODIUM: 141 mmol/L (ref 134–144)
Total Protein: 6.7 g/dL (ref 6.0–8.5)

## 2016-07-02 NOTE — Telephone Encounter (Signed)
-----   Message from Britt Bottom, MD sent at 07/02/2016  9:09 AM EST ----- Her chemistry blood tests look good --- the kidney test is back to normal

## 2016-07-02 NOTE — Telephone Encounter (Signed)
Results message  sent to pt. via MyChart/fim

## 2016-07-21 ENCOUNTER — Telehealth: Payer: Self-pay | Admitting: Internal Medicine

## 2016-07-21 NOTE — Telephone Encounter (Signed)
Error

## 2016-07-22 ENCOUNTER — Encounter: Payer: Self-pay | Admitting: Internal Medicine

## 2016-07-22 ENCOUNTER — Other Ambulatory Visit (INDEPENDENT_AMBULATORY_CARE_PROVIDER_SITE_OTHER): Payer: Medicare Other

## 2016-07-22 ENCOUNTER — Ambulatory Visit (INDEPENDENT_AMBULATORY_CARE_PROVIDER_SITE_OTHER): Payer: Medicare Other | Admitting: Internal Medicine

## 2016-07-22 VITALS — BP 124/86 | HR 77 | Temp 97.6°F | Resp 16 | Wt 111.0 lb

## 2016-07-22 DIAGNOSIS — R531 Weakness: Secondary | ICD-10-CM | POA: Insufficient documentation

## 2016-07-22 DIAGNOSIS — E041 Nontoxic single thyroid nodule: Secondary | ICD-10-CM

## 2016-07-22 DIAGNOSIS — M0579 Rheumatoid arthritis with rheumatoid factor of multiple sites without organ or systems involvement: Secondary | ICD-10-CM

## 2016-07-22 DIAGNOSIS — R490 Dysphonia: Secondary | ICD-10-CM

## 2016-07-22 DIAGNOSIS — R5383 Other fatigue: Secondary | ICD-10-CM

## 2016-07-22 DIAGNOSIS — L659 Nonscarring hair loss, unspecified: Secondary | ICD-10-CM

## 2016-07-22 DIAGNOSIS — H02423 Myogenic ptosis of bilateral eyelids: Secondary | ICD-10-CM | POA: Diagnosis not present

## 2016-07-22 LAB — CBC WITH DIFFERENTIAL/PLATELET
BASOS ABS: 0 10*3/uL (ref 0.0–0.1)
Basophils Relative: 0.2 % (ref 0.0–3.0)
EOS PCT: 0.2 % (ref 0.0–5.0)
Eosinophils Absolute: 0 10*3/uL (ref 0.0–0.7)
HCT: 43.8 % (ref 36.0–46.0)
Hemoglobin: 14.9 g/dL (ref 12.0–15.0)
LYMPHS PCT: 14.4 % (ref 12.0–46.0)
Lymphs Abs: 1 10*3/uL (ref 0.7–4.0)
MCHC: 34 g/dL (ref 30.0–36.0)
MCV: 104.5 fl — AB (ref 78.0–100.0)
MONOS PCT: 11.1 % (ref 3.0–12.0)
Monocytes Absolute: 0.8 10*3/uL (ref 0.1–1.0)
Neutro Abs: 5.2 10*3/uL (ref 1.4–7.7)
Neutrophils Relative %: 74.1 % (ref 43.0–77.0)
Platelets: 341 10*3/uL (ref 150.0–400.0)
RBC: 4.19 Mil/uL (ref 3.87–5.11)
RDW: 16.2 % — ABNORMAL HIGH (ref 11.5–15.5)
WBC: 7 10*3/uL (ref 4.0–10.5)

## 2016-07-22 LAB — SEDIMENTATION RATE: SED RATE: 30 mm/h (ref 0–30)

## 2016-07-22 NOTE — Assessment & Plan Note (Signed)
?   Atypical gerd ? Thyromegaly Will check labs - and thyroid US May need swallowing evaluation/ GI evaluation

## 2016-07-22 NOTE — Assessment & Plan Note (Signed)
She describes it has extreme fatigue - not similar to RA or MS in the past Check labs - tfts, cbc, cmp, esr, crp Also having weight loss, lightheadedness,hoarseness, dysphagia, hair loss Further evaluation depending on lab results

## 2016-07-22 NOTE — Patient Instructions (Addendum)
  Test(s) ordered today. Your results will be released to MyChart (or called to you) after review, usually within 72hours after test completion. If any changes need to be made, you will be notified at that same time.    Medications reviewed and updated.  No changes recommended at this time.     

## 2016-07-22 NOTE — Assessment & Plan Note (Signed)
Taking vitamins  ? Thyroid disorder - labs ordered

## 2016-07-22 NOTE — Assessment & Plan Note (Signed)
Following with rheum No other symptoms of RA flare Methotrexate dose recently decreased Check basic labs, esr, crp

## 2016-07-22 NOTE — Progress Notes (Signed)
Subjective:    Patient ID: Sherri [name], female    DOB: 1941-02-28, 76 y.o.   MRN: [id number]  HPI She is here for an acute visit.  She has not been feeling well for a couple of months.   She has no energy -  She has extreme fatigue.  After being awake for 2  1/2 hours in the morning she needs to lay back down.  She never needs to lay down during the day.  She lost 5 lbs in two months without trying.  She denies changes in her diet or activity level.  She feels dizzy/lightheadedness - no spinning, just off balanced.    She has a lump in the right side of her neck - thyroid nodule. The right thyroid lobe was removed in the 1980's.  She had a ct scan years ago and it grew back. Her last [place] was last year and there are nodules.  She feels the nodule on the right side of her neck has gotten larger.    She has had some problems swallowing and has hoarseness.  She has heartburn on occasion, but not often.    Her hair is coming out.  Her skin is very dry and itchy.   She has RA and MS and her symptoms are not consistent with a flare of either.  Her joint pain is at her baseline.     Sleep is not good due to her feet spasms and jerking of legs.  She takes the diazepam and oxycodone at night and she is able to sleep.      Medications and allergies reviewed with patient and updated if appropriate.  Patient Active Problem List   Diagnosis Date Noted  . Right hip pain 04/08/2016  . Right knee pain 02/25/2016  . Dyspepsia 04/05/2015  . Numbness 11/14/2014  . Biceps tendon tear 10/10/2014  . Insomnia 10/10/2014  . Gait disturbance 06/13/2014  . 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 OP (osteoporosis) 01/18/2014  . Other long term (current) drug therapy 01/18/2014  . Other dysphagia 12/29/2013  . Hoarseness 12/29/2013  . 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 Personal history of colonic polyps 07/03/2010  . Nocturia  05/28/2010  . THYROID NODULE 05/17/2008  . Multiple sclerosis ([place]) 05/17/2008  . Chronic fatigue 03/30/2007  . Hyperlipidemia 08/20/2006  . Essential hypertension 08/20/2006  . Rheumatoid arthritis ([place]) 08/20/2006  . CERVICAL CANCER, HX OF 08/20/2006  . GASTRIC ULCER, HX OF 08/20/2006    Current Outpatient Prescriptions on File Prior to Visit  Medication Sig Dispense Refill  . atorvastatin (LIPITOR) 10 MG tablet Take 1 tablet by mouth on Mondays and Friday 27 tablet 2  . Biotin 5000 MCG TABS Take 5,000 mcg by mouth daily.     . Calcium Carbonate-Vitamin D (CALCIUM + D PO) Take 1 tablet by mouth daily. 1200 mg daily    . ciclopirox (PENLAC) 8 % solution Apply topically at bedtime. Apply over nail and surrounding skin. Apply daily over previous coat. After seven (7) days, may remove with alcohol and continue cycle. 6.6 mL 0  . Cyanocobalamin (VITAMIN B-12 PO) Take 1 tablet by mouth daily.    . diazepam (VALIUM) 5 MG tablet Take 1 tablet (5 mg total) by mouth every 8 (eight) hours as needed for anxiety. 90 tablet 0  . folic acid (FOLVITE) 1 MG tablet Take 1 mg by mouth 2 (two) times daily.    [name]  furosemide (LASIX) 20 MG tablet TAKE 1 TABLET BY MOUTH EVERY DAY 90 tablet 1  . methotrexate (RHEUMATREX) 2.5 MG tablet Take 4 tablets (10 mg total) by mouth once a week. Caution:Chemotherapy. Protect from light. (Patient taking differently: Take 15 mg by mouth once a week. Caution:Chemotherapy. Protect from light.) 48 tablet 0  . moxifloxacin (VIGAMOX) 0.5 % ophthalmic solution PLACE 1 DROP INTO OPERATIVE EYE 4 TIMES DAILY STARTING 2 DAYS BEFORE, AND 2 DROPS MORNING OF SURGERY  0  . Omega-3 Fatty Acids (OMEGA 3 PO) Take 1,000 mg by mouth daily.     . Oxycodone HCl 10 MG TABS Take 1 tablet (10 mg total) by mouth 3 (three) times daily as needed. 90 tablet 0  . prednisoLONE acetate (PRED FORTE) 1 % ophthalmic suspension PLACE 1 DROP INTO OPERATIVE EYE 4 TIMES DAILY  0  . predniSONE (DELTASONE) 1 MG  tablet Take 4 tablets (4 mg total) by mouth daily with breakfast. 360 tablet 0  . Pyridoxine HCl (VITAMIN B-6 PO) Take 1 tablet by mouth daily.    . ramipril (ALTACE) 5 MG capsule Take 1 capsule (5 mg total) by mouth daily with breakfast. 90 capsule 2  . tiZANidine (ZANAFLEX) 4 MG capsule Take 4 mg by mouth 3 (three) times daily as needed for muscle spasms.    . Tofacitinib Citrate (XELJANZ XR) 11 MG TB24 Take 11 mg by mouth daily. 60 tablet 0  . traMADol (ULTRAM) 50 MG tablet Take 1 tablet (50 mg total) by mouth 4 (four) times daily. 120 tablet 3   No current facility-administered medications on file prior to visit.     Past Medical History:  Diagnosis Date  . Anxiety    takes Valium daily as needed  . Bruises easily    d/t meds  . Cancer ([place])    basal cell ca, in situ- uterine   . Cataracts, bilateral   . Chronic back pain   . Dizziness    r/t to meds  . GERD (gastroesophageal reflux disease)    no meds on a regular basis but will take Tums if needed  . Headache(784.0)    r/t neck issues  . History of bronchitis 6-35yrs ago  . History of colon polyps    benign  . Hyperlipidemia    takes Atorvastatin on Mondays and Fridays  . Hypertension    takes Ramipril daily  . Joint pain   . Joint swelling   . Multiple sclerosis ([place])   . Neuromuscular disorder ([place])    Dr. Park Liter- in [place]- [facility] follows M.S[facility]  . Nocturia   . OA (osteoarthritis)    Dr Jani Gravel weekly  . PONV (postoperative nausea and vomiting)    trouble urinating after surgery in 2014  . Rheumatoid arthritis(714.0)    Dr Estanislado Pandy takes [name] daily  . Skin cancer   . Urinary retention    sees Dr.Wrenn about 2 times a yr    Past Surgical History:  Procedure Laterality Date  . ABDOMINAL HYSTERECTOMY     1985  . APPENDECTOMY     with TAH  . BACK SURGERY     several  . CARPAL TUNNEL RELEASE    . CERVICAL FUSION     Dr Joya Salm  . CERVICAL FUSION  12/31/2012   Dr Joya Salm  . CHEST TUBE  INSERTION     for traumatic Pneumothorax  . COLONOSCOPY  07/16/2010   normal   . COLONOSCOPY W/ POLYPECTOMY  1997   negative since;  Dr Deatra Ina  . ESOPHAGOGASTRODUODENOSCOPY  07/16/2010   normal  . LUMBAR FUSION     Dr Joya Salm  . NASAL SINUS SURGERY    . POSTERIOR CERVICAL FUSION/FORAMINOTOMY N/A 12/31/2012   Procedure: POSTERIOR LATERAL CERVICAL FUSION/FORAMINOTOMY LEVEL 1 CERVICAL THREE-FOUR WITH LATERAL MASS SCREWS;  Surgeon: Floyce Stakes, MD;  Location: [facility];  Service: Neurosurgery;  Laterality: N/A;  . SKIN BIOPSY    . THYROID SURGERY     R lobe removed- 1972  . TOTAL HIP ARTHROPLASTY  12/22/2011   Procedure: TOTAL HIP ARTHROPLASTY;  Surgeon: Kerin Salen, MD;  Location: [facility];  Service: Orthopedics;  Laterality: Left;    Social History   Social History  . Marital status: Married    Spouse name: N/A  . Number of children: N/A  . Years of education: N/A   Occupational History  . retired    Social History Main Topics  . Smoking status: Former Smoker    Quit date: 05/26/1981  . Smokeless tobacco: Never Used     Comment: Smoked [phone], up to 2.5 ppd  . Alcohol use 0.0 oz/week     Comment: 2-3 beers every day 2%  . Drug use: No  . Sexual activity: Yes   Other Topics Concern  . Not on file   Social History Narrative  . No narrative on file    Family History  Problem Relation Age of Onset  . Cancer Mother     pancreatic  . Diabetes Mother   . Heart disease Father     Rheumatic  . Cancer Father     ? stomach  . Cancer Sister     stomach  . Cancer Maternal Aunt     X 4; ? primary  . Heart disease Paternal Aunt   . Cancer Maternal Grandmother     cervical  . Colon cancer      Aunts  . Multiple sclerosis Daughter   . Hypertension Neg Hx   . Stroke Neg Hx     Review of Systems  Constitutional: Positive for fatigue and unexpected weight change.  HENT: Positive for trouble swallowing and voice change (hoarseness).   Eyes: Negative for visual  disturbance.  Respiratory: Negative for cough, shortness of breath and wheezing.        Breathing more shallow - ? Related to fatigue  Cardiovascular: Positive for leg swelling (mild). Negative for chest pain and palpitations.  Gastrointestinal: Positive for constipation (chronic). Negative for abdominal pain and diarrhea.       A little GERD  Musculoskeletal: Arthralgias: chronic - from RA - no change.  Neurological: Positive for dizziness and light-headedness. Negative for headaches.       Objective:   Vitals:   07/22/16 1613  BP: 124/86  Pulse: 77  Resp: 16  Temp: 97.6 F (36.4 C)   Filed Weights   07/22/16 1613  Weight: 111 lb (50.3 kg)   Body mass index is 19.66 kg/m.  Wt Readings from Last 3 Encounters:  07/22/16 111 lb (50.3 kg)  07/01/16 115 lb 8 oz (52.4 kg)  06/05/16 112 lb (50.8 kg)     Physical Exam  Constitutional: She appears well-developed and well-nourished. No distress.  HENT:  Head: Normocephalic and atraumatic.  Mouth/Throat: Oropharynx is clear and moist.  Eyes: Conjunctivae are normal.  Neck: Neck supple. No tracheal deviation present. Thyromegaly (palpable nodules on rigtht  - one is visible) present.  Cardiovascular: Normal rate, regular rhythm and normal heart sounds.  No murmur heard. Pulmonary/Chest: No respiratory distress. She has no wheezes. She has no rales.  Abdominal: Soft. She exhibits no distension. There is no tenderness.  Musculoskeletal: She exhibits no edema.  Lymphadenopathy:    She has no cervical adenopathy.  Skin: Skin is warm and dry. She is not diaphoretic.  Psychiatric: She has a normal mood and affect. Her behavior is normal.          Assessment & Plan:   See Problem List for Assessment and Plan of chronic medical problems.

## 2016-07-22 NOTE — Assessment & Plan Note (Signed)
She feels her nodule on the right has gotten larger  Will recheck [place] tft's

## 2016-07-22 NOTE — Progress Notes (Signed)
Pre visit review using our clinic review tool, if applicable. No additional management support is needed unless otherwise documented below in the visit note. 

## 2016-07-23 LAB — COMPREHENSIVE METABOLIC PANEL
ALBUMIN: 4.5 g/dL (ref 3.5–5.2)
ALK PHOS: 44 U/L (ref 39–117)
ALT: 38 U/L — AB (ref 0–35)
AST: 37 U/L (ref 0–37)
BILIRUBIN TOTAL: 0.3 mg/dL (ref 0.2–1.2)
BUN: 8 mg/dL (ref 6–23)
CO2: 26 mEq/L (ref 19–32)
Calcium: 10.4 mg/dL (ref 8.4–10.5)
Chloride: 97 mEq/L (ref 96–112)
Creatinine, Ser: 0.84 mg/dL (ref 0.40–1.20)
GFR: 70.36 mL/min (ref >60.00)
Glucose, Bld: 84 mg/dL (ref 70–99)
POTASSIUM: 4.7 meq/L (ref 3.5–5.1)
Sodium: 135 mEq/L (ref 135–145)
TOTAL PROTEIN: 7.5 g/dL (ref 6.0–8.3)

## 2016-07-23 LAB — C-REACTIVE PROTEIN: CRP: 0.3 mg/dL — AB (ref 0.5–20.0)

## 2016-07-23 LAB — T4, FREE: FREE T4: 1.02 ng/dL (ref 0.60–1.60)

## 2016-07-23 LAB — VITAMIN B12: VITAMIN B 12: 961 pg/mL — AB (ref 211–911)

## 2016-07-23 LAB — T3, FREE: T3 FREE: 3.8 pg/mL (ref 2.3–4.2)

## 2016-07-23 LAB — TSH: TSH: 1.55 u[IU]/mL (ref 0.35–4.50)

## 2016-07-24 ENCOUNTER — Encounter: Payer: Self-pay | Admitting: Internal Medicine

## 2016-07-24 ENCOUNTER — Other Ambulatory Visit: Payer: Self-pay | Admitting: Rheumatology

## 2016-07-24 NOTE — Telephone Encounter (Signed)
Last Visit: 06/05/16 Next Visit: 09/04/16 Labs: 07/22/16 ALT 38 previous ALT 36  Okay to refill MTX?

## 2016-07-24 NOTE — Telephone Encounter (Signed)
Will continue to monitor.

## 2016-07-26 ENCOUNTER — Other Ambulatory Visit: Payer: Self-pay | Admitting: Rheumatology

## 2016-07-27 ENCOUNTER — Encounter: Payer: Self-pay | Admitting: Internal Medicine

## 2016-07-27 NOTE — Progress Notes (Unsigned)
[name],   Your blood work looks great - all is normal.  This is good, but also frustrating because I do not have an answer for your symptoms.  Your inflammatory markers are normal. Your thyroid function is normal.  Do you think the fatigue is related to your MS or changing the methotrexate dose?  Let me know if you have any questions or concerns.   Dr. Billey Gosling.

## 2016-07-28 ENCOUNTER — Telehealth: Payer: Self-pay

## 2016-07-28 NOTE — Telephone Encounter (Signed)
Last Visit: 06/05/16 Next Visit: 09/04/16  Okay to refill Voltaren Gel?

## 2016-07-28 NOTE — Telephone Encounter (Signed)
ok 

## 2016-07-28 NOTE — Telephone Encounter (Signed)
Received a conformation from [employer] regarding a prior authorization approval for Diclofenac Gel from 07/28/16  to 05/25/17   Reference [id number] Phone number:[phone]  Will send document to scan center once received.  Spoke with Ms. Klehr to update her. She voiced understanding and denied any further questions at this time  Demetrios Loll, CPhT   4:26 PM

## 2016-07-29 ENCOUNTER — Other Ambulatory Visit: Payer: Self-pay | Admitting: Radiology

## 2016-07-29 DIAGNOSIS — Z79899 Other long term (current) drug therapy: Secondary | ICD-10-CM | POA: Diagnosis not present

## 2016-07-29 DIAGNOSIS — Z9225 Personal history of immunosupression therapy: Secondary | ICD-10-CM

## 2016-07-29 LAB — COMPLETE METABOLIC PANEL WITH GFR
ALT: 30 U/L — AB (ref 6–29)
AST: 32 U/L (ref 10–35)
Albumin: 4 g/dL (ref 3.6–5.1)
Alkaline Phosphatase: 39 U/L (ref 33–130)
BUN: 9 mg/dL (ref 7–25)
CHLORIDE: 99 mmol/L (ref 98–110)
CO2: 28 mmol/L (ref 20–31)
CREATININE: 0.89 mg/dL (ref 0.60–0.93)
Calcium: 9.6 mg/dL (ref 8.6–10.4)
GFR, Est African American: 74 mL/min (ref >=60)
GFR, Est Non African American: 64 mL/min (ref >=60)
Glucose, Bld: 76 mg/dL (ref 65–99)
Potassium: 4.9 mmol/L (ref 3.5–5.3)
Sodium: 137 mmol/L (ref 135–146)
Total Bilirubin: 0.4 mg/dL (ref 0.2–1.2)
Total Protein: 6.2 g/dL (ref 6.1–8.1)

## 2016-07-29 LAB — CBC WITH DIFFERENTIAL/PLATELET
BASOS PCT: 0 %
Basophils Absolute: 0 cells/uL (ref 0–200)
Eosinophils Absolute: 0 cells/uL — ABNORMAL LOW (ref 15–500)
Eosinophils Relative: 0 %
HEMATOCRIT: 39 % (ref 35.0–45.0)
Hemoglobin: 13.2 g/dL (ref 11.7–15.5)
LYMPHS PCT: 11 %
Lymphs Abs: 649 cells/uL — ABNORMAL LOW (ref 850–3900)
MCH: 34.9 pg — ABNORMAL HIGH (ref 27.0–33.0)
MCHC: 33.8 g/dL (ref 32.0–36.0)
MCV: 103.2 fL — ABNORMAL HIGH (ref 80.0–100.0)
MONO ABS: 826 {cells}/uL (ref 200–950)
MONOS PCT: 14 %
MPV: 9.2 fL (ref 7.5–12.5)
NEUTROS ABS: 4425 {cells}/uL (ref 1500–7800)
Neutrophils Relative %: 75 %
Platelets: 332 10*3/uL (ref 140–400)
RBC: 3.78 MIL/uL — AB (ref 3.80–5.10)
RDW: 15.9 % — AB (ref 11.0–15.0)
WBC: 5.9 10*3/uL (ref 3.8–10.8)

## 2016-07-29 NOTE — Progress Notes (Signed)
Stable

## 2016-07-31 ENCOUNTER — Telehealth: Payer: Self-pay | Admitting: Rheumatology

## 2016-07-31 ENCOUNTER — Telehealth: Payer: Self-pay | Admitting: *Deleted

## 2016-07-31 LAB — QUANTIFERON TB GOLD ASSAY (BLOOD)
Interferon Gamma Release Assay: NEGATIVE
MITOGEN-NIL SO: 0.66 [IU]/mL
Quantiferon Nil Value: 0.02 IU/mL
Quantiferon Tb Ag Minus Nil Value: 0 IU/mL

## 2016-07-31 NOTE — Telephone Encounter (Signed)
Last Visit: 06/05/16 Next Visit: 09/04/16 Labs: 07/29/16 Stable  Okay to provide patient with Xeljanz 11 mg sample?

## 2016-07-31 NOTE — Telephone Encounter (Signed)
Patient would like to know if the office has any samples of xeljanz available? If so, patient is requesting a sample. Thanks!

## 2016-07-31 NOTE — Telephone Encounter (Signed)
Medication Samples have been provided to the patient.  Drug name: [name]      Strength: 11 mg       Qty: 1 LOT: [id number]  Exp.Date: Feb. 2019  Dosing instructions: Take 1 tablet by mouth daily.   The patient has been instructed regarding the correct time, dose, and frequency of taking this medication, including desired effects and most common side effects.   Gwenlyn Perking 2:03 PM 07/31/2016

## 2016-07-31 NOTE — Telephone Encounter (Signed)
ok 

## 2016-07-31 NOTE — Telephone Encounter (Signed)
Patient advised she could come by the office for sample of Xeljanz.

## 2016-08-01 ENCOUNTER — Ambulatory Visit
Admission: RE | Admit: 2016-08-01 | Discharge: 2016-08-01 | Disposition: A | Discharge status: Home / Self Care | Payer: Medicare Other | Source: Ambulatory Visit | Attending: Internal Medicine | Admitting: Internal Medicine

## 2016-08-01 DIAGNOSIS — E042 Nontoxic multinodular goiter: Secondary | ICD-10-CM | POA: Diagnosis not present

## 2016-08-01 DIAGNOSIS — E041 Nontoxic single thyroid nodule: Secondary | ICD-10-CM

## 2016-08-03 ENCOUNTER — Encounter: Payer: Self-pay | Admitting: Internal Medicine

## 2016-08-05 DIAGNOSIS — C44599 Other specified malignant neoplasm of skin of other part of trunk: Secondary | ICD-10-CM | POA: Diagnosis not present

## 2016-08-05 DIAGNOSIS — L570 Actinic keratosis: Secondary | ICD-10-CM | POA: Diagnosis not present

## 2016-08-05 DIAGNOSIS — D0462 Carcinoma in situ of skin of left upper limb, including shoulder: Secondary | ICD-10-CM | POA: Diagnosis not present

## 2016-08-05 DIAGNOSIS — L858 Other specified epidermal thickening: Secondary | ICD-10-CM | POA: Diagnosis not present

## 2016-08-05 DIAGNOSIS — D0472 Carcinoma in situ of skin of left lower limb, including hip: Secondary | ICD-10-CM | POA: Diagnosis not present

## 2016-08-05 DIAGNOSIS — C44629 Squamous cell carcinoma of skin of left upper limb, including shoulder: Secondary | ICD-10-CM | POA: Diagnosis not present

## 2016-08-05 DIAGNOSIS — D0471 Carcinoma in situ of skin of right lower limb, including hip: Secondary | ICD-10-CM | POA: Diagnosis not present

## 2016-08-08 ENCOUNTER — Other Ambulatory Visit: Payer: Self-pay | Admitting: Internal Medicine

## 2016-08-18 ENCOUNTER — Other Ambulatory Visit (HOSPITAL_COMMUNITY): Payer: Self-pay | Admitting: Oculoplastics Ophthalmology

## 2016-08-22 ENCOUNTER — Other Ambulatory Visit: Payer: Self-pay | Admitting: Rheumatology

## 2016-08-26 NOTE — Telephone Encounter (Signed)
Last Visit: 06/05/16 Next Visit: 09/04/16  Okay to refill Prednisone?

## 2016-08-26 NOTE — Telephone Encounter (Signed)
ok 

## 2016-09-02 DIAGNOSIS — Z79899 Other long term (current) drug therapy: Secondary | ICD-10-CM | POA: Insufficient documentation

## 2016-09-02 DIAGNOSIS — R350 Frequency of micturition: Secondary | ICD-10-CM | POA: Insufficient documentation

## 2016-09-02 DIAGNOSIS — M7918 Myalgia, other site: Secondary | ICD-10-CM | POA: Insufficient documentation

## 2016-09-02 NOTE — Progress Notes (Signed)
Office Visit Note  Patient: Sherri [name]             Date of Birth: 1941/02/21           MRN: [id number]             PCP: Binnie Rail, MD Referring: Binnie Rail, MD Visit Date: 09/04/2016 Occupation: _0 @    Subjective:  Follow-up   History of Present Illness: Sherri [name] is a 76 y.o. female    Doing well w/ [name] 51m XR;  mtx 628m/ week; folic acid 6m37md; prednisone long term taper (now on 18m)86md in a few weeks, pt plans to try 3 mg     Activities of Daily Living:  Patient reports morning stiffness for 30 minutes.   Patient Reports nocturnal pain.  Difficulty dressing/grooming: Reports Difficulty climbing stairs: Reports Difficulty getting out of chair: Reports Difficulty using hands for taps, buttons, cutlery, and/or writing: Reports   Review of Systems  Constitutional: Positive for fatigue.  HENT: Negative for mouth sores and mouth dryness.   Eyes: Negative for dryness.  Respiratory: Negative for shortness of breath.   Gastrointestinal: Negative for constipation and diarrhea.  Musculoskeletal: Positive for myalgias and myalgias.  Skin: Negative for sensitivity to sunlight.  Psychiatric/Behavioral: Positive for sleep disturbance. Negative for decreased concentration.    PMFS History:  Patient Active Problem List   Diagnosis Date Noted  . High risk medication use 09/02/2016  . Urinary frequency 09/02/2016  . Piriformis muscle pain 09/02/2016  . Fatigue 07/22/2016  . Hair loss 07/22/2016  . Right hip pain 04/08/2016  . Right knee pain 02/25/2016  . Dyspepsia 04/05/2015  . Numbness 11/14/2014  . Biceps tendon tear 10/10/2014  . Insomnia 10/10/2014  . Gait disturbance 06/13/2014  . 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 OP (osteoporosis) 01/18/2014  . Other long term (current) drug therapy 01/18/2014  . Other dysphagia 12/29/2013  . Hoarseness 12/29/2013  . 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 Personal history of colonic polyps 07/03/2010  . Nocturia 05/28/2010  . THYROID NODULE 05/17/2008  . Multiple sclerosis (HCC)[place]/23/2009  . Chronic fatigue 03/30/2007  . Hyperlipidemia 08/20/2006  . Essential hypertension 08/20/2006  . Rheumatoid arthritis (HCC)[place]/27/2008  . CERVICAL CANCER, HX OF 08/20/2006  . GASTRIC ULCER, HX OF 08/20/2006    Past Medical History:  Diagnosis Date  . Anxiety    takes Valium daily as needed  . Bruises easily    d/t meds  . 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 Cataracts, bilateral   . Chronic back pain   . Dizziness    r/t to meds  . GERD (gastroesophageal reflux disease)    no meds on a regular basis but will take Tums if needed  . Headache(784.0)    r/t neck issues  . History of bronchitis [id number]  . History of colon polyps    benign  . Hyperlipidemia    takes Atorvastatin on Mondays and Fridays  . Hypertension    takes Ramipril daily  . Joint pain   . Joint swelling   . Multiple sclerosis (HCC) [place] Neuromuscular disorder (HCC[facility] [place] [facility]  . Nocturia   . OA (osteoarthritis)    Dr VoyteJani Gravelly  . PONV (postoperative nausea and vomiting)    trouble urinating after surgery  in 2014  . Rheumatoid arthritis(714.0)    Dr Estanislado Pandy takes [name] daily  . Skin cancer   . Urinary retention    sees Dr.Wrenn about 2 times a yr    Family History  Problem Relation Age of Onset  . Cancer Mother     pancreatic  . Diabetes Mother   . Heart disease Father     Rheumatic  . Cancer Father     ? stomach  . Cancer Sister     stomach  . Cancer Maternal Aunt     X 4; ? primary  . Heart disease Paternal Aunt   . Cancer Maternal Grandmother     cervical  . Colon cancer      Aunts  . Multiple sclerosis Daughter   . Hypertension Neg Hx   . Stroke Neg Hx    Past Surgical History:  Procedure Laterality Date  . ABDOMINAL HYSTERECTOMY      1985  . APPENDECTOMY     with TAH  . BACK SURGERY     several  . CARPAL TUNNEL RELEASE    . CERVICAL FUSION     Dr Joya Salm  . CERVICAL FUSION  12/31/2012   Dr Joya Salm  . CHEST TUBE INSERTION     for traumatic Pneumothorax  . COLONOSCOPY  07/16/2010   normal   . COLONOSCOPY W/ POLYPECTOMY  1997   negative since; Dr Deatra Ina  . ESOPHAGOGASTRODUODENOSCOPY  07/16/2010   normal  . LUMBAR FUSION     Dr Joya Salm  . NASAL SINUS SURGERY    . POSTERIOR CERVICAL FUSION/FORAMINOTOMY N/A 12/31/2012   Procedure: POSTERIOR LATERAL CERVICAL FUSION/FORAMINOTOMY LEVEL 1 CERVICAL THREE-FOUR WITH LATERAL MASS SCREWS;  Surgeon: Floyce Stakes, MD;  Location: [facility];  Service: Neurosurgery;  Laterality: N/A;  . SKIN BIOPSY    . THYROID SURGERY     R lobe removed- 1972  . TOTAL HIP ARTHROPLASTY  12/22/2011   Procedure: TOTAL HIP ARTHROPLASTY;  Surgeon: Kerin Salen, MD;  Location: [facility];  Service: Orthopedics;  Laterality: Left;   Social History   Social History Narrative  . No narrative on file     Objective: Vital Signs: BP 100/64   Pulse 85   Resp 13   Ht _0  (1.6 m)   Wt 114 lb (51.7 kg)   BMI 20.19 kg/m    Physical Exam  Constitutional: She is oriented to person, place, and time. She appears well-developed and well-nourished.  HENT:  Head: Normocephalic and atraumatic.  Eyes: EOM are normal. Pupils are equal, round, and reactive to light.  Cardiovascular: Normal rate, regular rhythm and normal heart sounds.  Exam reveals no gallop and no friction rub.   No murmur heard. Pulmonary/Chest: Effort normal and breath sounds normal. She has no wheezes. She has no rales.  Abdominal: Soft. Bowel sounds are normal. She exhibits no distension. There is no tenderness. There is no guarding. No hernia.  Musculoskeletal: Normal range of motion. She exhibits no edema, tenderness or deformity.  Lymphadenopathy:    She has no cervical adenopathy.  Neurological: She is alert and oriented to  person, place, and time. Coordination normal.  Skin: Skin is warm and dry. Capillary refill takes less than 2 seconds. No rash noted.  Psychiatric: She has a normal mood and affect. Her behavior is normal.  Nursing note and vitals reviewed.    Musculoskeletal Exam:  FROM of all joints Grip strength equal and strong bilaterally  FMS 8/18 tender points   (  bilateral ischial bursitis) --> will see wilda young for eval and tx  CDAI Exam: CDAI Homunculus Exam:   Tenderness:  Right hand: 2nd MCP and 3rd MCP Left hand: 2nd MCP and 3rd MCP  Swelling:  Right hand: 2nd MCP and 3rd MCP Left hand: 2nd MCP and 3rd MCP  Joint Counts:  CDAI Tender Joint count: 4 CDAI Swollen Joint count: 4     Investigation: Findings:  most recent labs showed decrease in her GFR. Creatine   Status post bilateral cataract surgery. She is on prednisone eyedrops.  TB Gold Negative-07/2016   Orders Only on 07/29/2016  Component Date Value Ref Range Status  . WBC 07/29/2016 5.9  3.8 - 10.8 K/uL Final  . RBC 07/29/2016 3.78* 3.80 - 5.10 MIL/uL Final  . Hemoglobin 07/29/2016 13.2  11.7 - 15.5 g/dL Final  . HCT 07/29/2016 39.0  35.0 - 45.0 % Final  . MCV 07/29/2016 103.2* 80.0 - 100.0 fL Final  . MCH 07/29/2016 34.9* 27.0 - 33.0 pg Final  . MCHC 07/29/2016 33.8  32.0 - 36.0 g/dL Final  . RDW 07/29/2016 15.9* 11.0 - 15.0 % Final  . Platelets 07/29/2016 332  140 - 400 K/uL Final  . MPV 07/29/2016 9.2  7.5 - 12.5 fL Final  . Neutro Abs 07/29/2016 4425  1,500 - 7,800 cells/uL Final  . Lymphs Abs 07/29/2016 649* 850 - 3,900 cells/uL Final  . Monocytes Absolute 07/29/2016 826  200 - 950 cells/uL Final  . Eosinophils Absolute 07/29/2016 0* 15 - 500 cells/uL Final  . Basophils Absolute 07/29/2016 0  0 - 200 cells/uL Final  . Neutrophils Relative % 07/29/2016 75  % Final  . Lymphocytes Relative 07/29/2016 11  % Final  . Monocytes Relative 07/29/2016 14  % Final  . Eosinophils Relative 07/29/2016 0  % Final    . Basophils Relative 07/29/2016 0  % Final  . Smear Review 07/29/2016 Criteria for review not met   Final  . Sodium 07/29/2016 137  135 - 146 mmol/L Final  . Potassium 07/29/2016 4.9  3.5 - 5.3 mmol/L Final  . Chloride 07/29/2016 99  98 - 110 mmol/L Final  . CO2 07/29/2016 28  20 - 31 mmol/L Final  . Glucose, Bld 07/29/2016 76  65 - 99 mg/dL Final  . BUN 07/29/2016 9  7 - 25 mg/dL Final  . Creat 07/29/2016 0.89  0.60 - 0.93 mg/dL Final   Comment:   For patients > or = 75 years of age: The upper reference limit for Creatinine is approximately 13% higher for people identified as African-American.     . Total Bilirubin 07/29/2016 0.4  0.2 - 1.2 mg/dL Final  . Alkaline Phosphatase 07/29/2016 39  33 - 130 U/L Final  . AST 07/29/2016 32  10 - 35 U/L Final  . ALT 07/29/2016 30* 6 - 29 U/L Final  . Total Protein 07/29/2016 6.2  6.1 - 8.1 g/dL Final  . Albumin 07/29/2016 4.0  3.6 - 5.1 g/dL Final  . Calcium 07/29/2016 9.6  8.6 - 10.4 mg/dL Final  . GFR, Est African American 07/29/2016 74  >=60 mL/min Final  . GFR, Est Non African American 07/29/2016 64  >=60 mL/min Final  . Interferon Gamma Release Assay 07/29/2016 NEGATIVE  NEGATIVE Final  . Quantiferon Nil Value 07/29/2016 0.02  IU/mL Final  . Mitogen-Nil 07/29/2016 0.66  IU/mL Final  . Quantiferon Tb Ag Minus Nil Value 07/29/2016 0.00  IU/mL Final   Comment:   The Nil  tube value is used to determine if the patient has a preexisting immune response which could cause a false-positive reading on the test. In order for a test to be valid, the Nil tube must have a value of less than or equal to 8.0 IU/mL.   The mitogen control tube is used to assure the patient has a healthy immune status and also serves as a control for correct blood handling and incubation. It is used to detect false-negative readings. The mitogen tube must have a gamma interferon value of greater than or equal to 0.5 IU/mL higher than the value of the Nil tube.    The TB antigen tube is coated with the M. tuberculosis specific antigens. For a test to be considered positive, the TB antigen tube value minus the Nil tube value must be greater than or equal to 0.35 IU/mL.   For additional information, please refer to http://education.questdiagnostics.com/faq/QFT (This link is being provided for informational/educational purposes only.)   Lab on 07/22/2016  Component Date Value Ref Range Status  . TSH 07/22/2016 1.55  0.35 - 4.50 uIU/mL Final  . Free T4 07/22/2016 1.02  0.60 - 1.60 ng/dL Final   Comment: Specimens from patients who are undergoing biotin therapy and /or ingesting biotin supplements may contain high levels of biotin.  The higher biotin concentration in these specimens interferes with this Free T4 assay.  Specimens that contain high levels  of biotin may cause false high results for this Free T4 assay.  Please interpret results in light of the total clinical presentation of the patient.    . T3, Free 07/22/2016 3.8  2.3 - 4.2 pg/mL Final  . WBC 07/22/2016 7.0  4.0 - 10.5 K/uL Final  . RBC 07/22/2016 4.19  3.87 - 5.11 Mil/uL Final  . Hemoglobin 07/22/2016 14.9  12.0 - 15.0 g/dL Final  . HCT 07/22/2016 43.8  36.0 - 46.0 % Final  . MCV 07/22/2016 104.5* 78.0 - 100.0 fl Final  . MCHC 07/22/2016 34.0  30.0 - 36.0 g/dL Final  . RDW 07/22/2016 16.2* 11.5 - 15.5 % Final  . Platelets 07/22/2016 341.0  150.0 - 400.0 K/uL Final  . Neutrophils Relative % 07/22/2016 74.1  43.0 - 77.0 % Final  . Lymphocytes Relative 07/22/2016 14.4  12.0 - 46.0 % Final  . Monocytes Relative 07/22/2016 11.1  3.0 - 12.0 % Final  . Eosinophils Relative 07/22/2016 0.2  0.0 - 5.0 % Final  . Basophils Relative 07/22/2016 0.2  0.0 - 3.0 % Final  . Neutro Abs 07/22/2016 5.2  1.4 - 7.7 K/uL Final  . Lymphs Abs 07/22/2016 1.0  0.7 - 4.0 K/uL Final  . Monocytes Absolute 07/22/2016 0.8  0.1 - 1.0 K/uL Final  . Eosinophils Absolute 07/22/2016 0.0  0.0 - 0.7 K/uL Final  .  Basophils Absolute 07/22/2016 0.0  0.0 - 0.1 K/uL Final  . Sodium 07/22/2016 135  135 - 145 mEq/L Final  . Potassium 07/22/2016 4.7  3.5 - 5.1 mEq/L Final  . Chloride 07/22/2016 97  96 - 112 mEq/L Final  . CO2 07/22/2016 26  19 - 32 mEq/L Final  . Glucose, Bld 07/22/2016 84  70 - 99 mg/dL Final  . BUN 07/22/2016 8  6 - 23 mg/dL Final  . Creatinine, Ser 07/22/2016 0.84  0.40 - 1.20 mg/dL Final  . Total Bilirubin 07/22/2016 0.3  0.2 - 1.2 mg/dL Final  . Alkaline Phosphatase 07/22/2016 44  39 - 117 U/L Final  . AST 07/22/2016 37  0 - 37 U/L Final  . ALT 07/22/2016 38* 0 - 35 U/L Final  . Total Protein 07/22/2016 7.5  6.0 - 8.3 g/dL Final  . Albumin 07/22/2016 4.5  3.5 - 5.2 g/dL Final  . Calcium 07/22/2016 10.4  8.4 - 10.5 mg/dL Final  . GFR 07/22/2016 70.36  >60.00 mL/min Final  . Vitamin B-12 07/22/2016 961* 211 - 911 pg/mL Final  . Sed Rate 07/22/2016 30  0 - 30 mm/hr Final  . CRP 07/22/2016 0.3* 0.5 - 20.0 mg/dL Final  Office Visit on 07/01/2016  Component Date Value Ref Range Status  . Glucose 07/01/2016 94  65 - 99 mg/dL Final  . BUN 07/01/2016 9  8 - 27 mg/dL Final  . Creatinine, Ser 07/01/2016 0.92  0.57 - 1.00 mg/dL Final  . GFR calc non Af Amer 07/01/2016 62  >59 mL/min/1.73 Final  . GFR calc Af Amer 07/01/2016 71  >59 mL/min/1.73 Final  . BUN/Creatinine Ratio 07/01/2016 10* 12 - 28 Final  . Sodium 07/01/2016 141  134 - 144 mmol/L Final  . Potassium 07/01/2016 5.1  3.5 - 5.2 mmol/L Final  . Chloride 07/01/2016 99  96 - 106 mmol/L Final  . CO2 07/01/2016 26  18 - 29 mmol/L Final  . Calcium 07/01/2016 10.0  8.7 - 10.3 mg/dL Final  . Total Protein 07/01/2016 6.7  6.0 - 8.5 g/dL Final  . Albumin 07/01/2016 4.5  3.5 - 4.8 g/dL Final  . Globulin, Total 07/01/2016 2.2  1.5 - 4.5 g/dL Final  . Albumin/Globulin Ratio 07/01/2016 2.0  1.2 - 2.2 Final  . Bilirubin Total 07/01/2016 0.3  0.0 - 1.2 mg/dL Final  . Alkaline Phosphatase 07/01/2016 45  39 - 117 IU/L Final  . AST 07/01/2016  32  0 - 40 IU/L Final  . ALT 07/01/2016 36* 0 - 32 IU/L Final  Office Visit on 06/05/2016  Component Date Value Ref Range Status  . Color, Urine 06/05/2016 YELLOW  YELLOW Final  . APPearance 06/05/2016 CLEAR  CLEAR Final  . Specific Gravity, Urine 06/05/2016 1.006  1.001 - 1.035 Final  . pH 06/05/2016 7.0  5.0 - 8.0 Final  . Glucose, UA 06/05/2016 NEGATIVE  NEGATIVE Final  . Bilirubin Urine 06/05/2016 NEGATIVE  NEGATIVE Final  . Ketones, ur 06/05/2016 NEGATIVE  NEGATIVE Final  . Hgb urine dipstick 06/05/2016 NEGATIVE  NEGATIVE Final  . Protein, ur 06/05/2016 NEGATIVE  NEGATIVE Final  . Nitrite 06/05/2016 NEGATIVE  NEGATIVE Final  . Leukocytes, UA 06/05/2016 3+* NEGATIVE Final  . Organism ID, Bacteria 06/05/2016    Final                   Value:Three or more organisms present,each greater than 10,000 CFU/mL.These organisms,commonly found on external and internal genitalia,are considered to be colonizers.No further testing performed.   . WBC, UA 06/05/2016 6-10* <=5 WBC/HPF Final  . RBC / HPF 06/05/2016 0-2  <=2 RBC/HPF Final  . Squamous Epithelial / LPF 06/05/2016 0-5  <=5 HPF Final  . Bacteria, UA 06/05/2016 NONE SEEN  NONE SEEN HPF Final  . Crystals 06/05/2016 NONE SEEN  NONE SEEN HPF Final  . Casts 06/05/2016 NONE SEEN  NONE SEEN LPF Final  . Yeast 06/05/2016 NONE SEEN  NONE SEEN HPF Final  Orders Only on 06/03/2016  Component Date Value Ref Range Status  . WBC 06/03/2016 7.2  3.8 - 10.8 K/uL Final  . RBC 06/03/2016 3.85  3.80 - 5.10 MIL/uL Final  . Hemoglobin 06/03/2016 13.2  11.7 - 15.5 g/dL Final  . HCT 06/03/2016 39.2  35.0 - 45.0 % Final  . MCV 06/03/2016 101.8* 80.0 - 100.0 fL Final  . MCH 06/03/2016 34.3* 27.0 - 33.0 pg Final  . MCHC 06/03/2016 33.7  32.0 - 36.0 g/dL Final  . RDW 06/03/2016 16.3* 11.0 - 15.0 % Final  . Platelets 06/03/2016 300  140 - 400 K/uL Final  . MPV 06/03/2016 9.2  7.5 - 12.5 fL Final  . Neutro Abs 06/03/2016 5832  1,500 - 7,800 cells/uL Final   . Lymphs Abs 06/03/2016 648* 850 - 3,900 cells/uL Final  . Monocytes Absolute 06/03/2016 720  200 - 950 cells/uL Final  . Eosinophils Absolute 06/03/2016 0* 15 - 500 cells/uL Final  . Basophils Absolute 06/03/2016 0  0 - 200 cells/uL Final  . Neutrophils Relative % 06/03/2016 81  % Final  . Lymphocytes Relative 06/03/2016 9  % Final  . Monocytes Relative 06/03/2016 10  % Final  . Eosinophils Relative 06/03/2016 0  % Final  . Basophils Relative 06/03/2016 0  % Final  . Smear Review 06/03/2016 SEE NOTE   Final   Comment:   RBC, Platelet and WBC Morphology unremarkable   . Sodium 06/03/2016 139  135 - 146 mmol/L Final  . Potassium 06/03/2016 4.9  3.5 - 5.3 mmol/L Final  . Chloride 06/03/2016 101  98 - 110 mmol/L Final  . CO2 06/03/2016 27  20 - 31 mmol/L Final  . Glucose, Bld 06/03/2016 71  65 - 99 mg/dL Final  . BUN 06/03/2016 13  7 - 25 mg/dL Final  . Creat 06/03/2016 1.22* 0.60 - 0.93 mg/dL Final   Comment:   For patients > or = 75 years of age: The upper reference limit for Creatinine is approximately 13% higher for people identified as African-American.     . Total Bilirubin 06/03/2016 0.5  0.2 - 1.2 mg/dL Final  . Alkaline Phosphatase 06/03/2016 33  33 - 130 U/L Final  . AST 06/03/2016 33  10 - 35 U/L Final  . ALT 06/03/2016 33* 6 - 29 U/L Final  . Total Protein 06/03/2016 6.3  6.1 - 8.1 g/dL Final  . Albumin 06/03/2016 4.1  3.6 - 5.1 g/dL Final  . Calcium 06/03/2016 9.6  8.6 - 10.4 mg/dL Final  . GFR, Est African American 06/03/2016 50* >=60 mL/min Final  . GFR, Est Non African American 06/03/2016 44* >=60 mL/min Final  Appointment on 04/14/2016  Component Date Value Ref Range Status  . Sodium 04/14/2016 138  135 - 145 mEq/L Final  . Potassium 04/14/2016 4.4  3.5 - 5.1 mEq/L Final  . Chloride 04/14/2016 101  96 - 112 mEq/L Final  . CO2 04/14/2016 27  19 - 32 mEq/L Final  . Glucose, Bld 04/14/2016 79  70 - 99 mg/dL Final  . BUN 04/14/2016 12  6 - 23 mg/dL Final  .  Creatinine, Ser 04/14/2016 0.88  0.40 - 1.20 mg/dL Final  . Total Bilirubin 04/14/2016 0.5  0.2 - 1.2 mg/dL Final  . Alkaline Phosphatase 04/14/2016 41  39 - 117 U/L Final  . AST 04/14/2016 21  0 - 37 U/L Final  . ALT 04/14/2016 17  0 - 35 U/L Final  . Total Protein 04/14/2016 6.7  6.0 - 8.3 g/dL Final  . Albumin 04/14/2016 4.1  3.5 - 5.2 g/dL Final  . Calcium 04/14/2016 9.8  8.4 - 10.5 mg/dL Final  . GFR 04/14/2016 66.74  >60.00 mL/min Final  . WBC  04/14/2016 5.1  4.0 - 10.5 K/uL Final  . RBC 04/14/2016 4.06  3.87 - 5.11 Mil/uL Final  . Hemoglobin 04/14/2016 13.8  12.0 - 15.0 g/dL Final  . HCT 04/14/2016 40.6  36.0 - 46.0 % Final  . MCV 04/14/2016 99.9  78.0 - 100.0 fl Final  . MCHC 04/14/2016 34.0  30.0 - 36.0 g/dL Final  . RDW 04/14/2016 15.3  11.5 - 15.5 % Final  . Platelets 04/14/2016 328.0  150.0 - 400.0 K/uL Final  . Neutrophils Relative % 04/14/2016 60.1  43.0 - 77.0 % Final  . Lymphocytes Relative 04/14/2016 31.0  12.0 - 46.0 % Final  . Monocytes Relative 04/14/2016 7.9  3.0 - 12.0 % Final  . Eosinophils Relative 04/14/2016 0.7  0.0 - 5.0 % Final  . Basophils Relative 04/14/2016 0.3  0.0 - 3.0 % Final  . Neutro Abs 04/14/2016 3.1  1.4 - 7.7 K/uL Final  . Lymphs Abs 04/14/2016 1.6  0.7 - 4.0 K/uL Final  . Monocytes Absolute 04/14/2016 0.4  0.1 - 1.0 K/uL Final  . Eosinophils Absolute 04/14/2016 0.0  0.0 - 0.7 K/uL Final  . Basophils Absolute 04/14/2016 0.0  0.0 - 0.1 K/uL Final  . Cholesterol 04/14/2016 205* 0 - 200 mg/dL Final  . Triglycerides 04/14/2016 116.0  0.0 - 149.0 mg/dL Final  . HDL 04/14/2016 82.90  >39.00 mg/dL Final  . VLDL 04/14/2016 23.2  0.0 - 40.0 mg/dL Final  . LDL Cholesterol 04/14/2016 98  0 - 99 mg/dL Final  . Total CHOL/HDL Ratio 04/14/2016 2   Final  . NonHDL 04/14/2016 121.60   Final  . TSH 04/14/2016 4.11  0.35 - 4.50 uIU/mL Final      Imaging: No results found.  Speciality Comments: No specialty comments available.    Procedures:   Trigger Point Inj (for piriformis syndrome -bilateral) Date/Time: 09/04/2016 3:27 PM Performed by: Bo Merino Authorized by: Eliezer Lofts   Consent Given by:  Patient Site marked: the procedure site was marked   Timeout: prior to procedure the correct patient, procedure, and site was verified   Indications:  Muscle spasm and pain Total # of Trigger Points:  2 Location: neck and hip   Needle Size:  27 G Approach:  Dorsal Medications #1:  0.3 mL lidocaine 1 %; 10 mg triamcinolone acetonide 40 MG/ML Medications #2:  0.3 mL lidocaine 1 %; 10 mg triamcinolone acetonide 40 MG/ML Patient tolerance:  Patient tolerated the procedure well with no immediate complications Comments: Bilateral piriformis syndrom were  Both areas injected by Dr. Estanislado Pandy. 10 mg of Kenalog mixed with 0.3 and also 1% lidocaine. Patient tolerated procedure well. Patient will follow with Hart Robinsons young for further evaluation and treatment.    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 I've asked her to see WHart Robinsonsyoung for  evaluation and treatment. Patient recalls that when she had her bladder problem many years ago, she had been treated by [place] young and  really enjoyed her service. She will follow with her for evaluation and treatment.  #3: Patient is getting eye surgery on 10/02/2016. I advised her to stop her medication a week before the surgery and also week after the surgery she states that off medication until cleared by her eye doctor.  #4: Fibromyalgia syndrome. Patient does have generalized body aches, fatigue, insomnia and she has about 8 out of 18 tender points today. She is also having bilateral shoulder bursitis. She's had this symptoms off and on for many years and has been told in the past that she does have some fibromyalgia.  #5: History of multiple sclerosis. Seeing Dr. Arlice Colt for this. He is giving her pain medication.  #6:   (bilateral ischial bursitis) --> will see wilda young for eval and tx Addendum (piriformis syndrome)  #7: Sample of Xeljanz XR 11 mg given to the patient. She cannot afford the co-pay and is in need of assistance from our office. 2 bottles given  #8: TB goal is negative as of March 2018 and does not require repeat tests until March 2019.  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 She states that these lesions coincide with the time that she started [place] (she started [place] 3 years ago). ( we may need to stop the xeljanx if squamous cell lesions continue to recur).  Orders: No orders of the defined types were placed in this encounter.  No orders of the defined types were placed in this encounter.   Face-to-face time spent with patient was 30 minutes. 50% of time was spent in counseling and coordination of care.  Follow-Up Instructions: No Follow-up on file.   [employer], PA-C  She is doing well on [place]. On my exam she had tenderness over bilateral pyriformis area. After informed consent was obtained the area was injected bilaterally with cortisone. The procedures described above. I examined  and evaluated the patient with Eliezer Lofts PA. The plan of care was discussed as noted above.  Bo Merino, MD Note - This record has been created using Editor, commissioning.  Chart creation errors have been sought, but may not always  have been located. Such creation errors do not reflect on  the standard of medical care.

## 2016-09-04 ENCOUNTER — Ambulatory Visit (INDEPENDENT_AMBULATORY_CARE_PROVIDER_SITE_OTHER): Payer: Medicare Other | Admitting: Rheumatology

## 2016-09-04 ENCOUNTER — Encounter: Payer: Self-pay | Admitting: Rheumatology

## 2016-09-04 VITALS — BP 100/64 | HR 85 | Resp 13 | Ht 63.0 in | Wt 114.0 lb

## 2016-09-04 DIAGNOSIS — G35 Multiple sclerosis: Secondary | ICD-10-CM | POA: Diagnosis not present

## 2016-09-04 DIAGNOSIS — I10 Essential (primary) hypertension: Secondary | ICD-10-CM | POA: Diagnosis not present

## 2016-09-04 DIAGNOSIS — E7849 Other hyperlipidemia: Secondary | ICD-10-CM

## 2016-09-04 DIAGNOSIS — M62838 Other muscle spasm: Secondary | ICD-10-CM | POA: Diagnosis not present

## 2016-09-04 DIAGNOSIS — R5383 Other fatigue: Secondary | ICD-10-CM

## 2016-09-04 DIAGNOSIS — M0579 Rheumatoid arthritis with rheumatoid factor of multiple sites without organ or systems involvement: Secondary | ICD-10-CM

## 2016-09-04 DIAGNOSIS — G5703 Lesion of sciatic nerve, bilateral lower limbs: Secondary | ICD-10-CM

## 2016-09-04 DIAGNOSIS — R350 Frequency of micturition: Secondary | ICD-10-CM | POA: Diagnosis not present

## 2016-09-04 DIAGNOSIS — E784 Other hyperlipidemia: Secondary | ICD-10-CM | POA: Diagnosis not present

## 2016-09-04 DIAGNOSIS — M7070 Other bursitis of hip, unspecified hip: Secondary | ICD-10-CM | POA: Diagnosis not present

## 2016-09-04 DIAGNOSIS — G5702 Lesion of sciatic nerve, left lower limb: Secondary | ICD-10-CM

## 2016-09-04 DIAGNOSIS — M4312 Spondylolisthesis, cervical region: Secondary | ICD-10-CM | POA: Diagnosis not present

## 2016-09-04 DIAGNOSIS — F5101 Primary insomnia: Secondary | ICD-10-CM | POA: Diagnosis not present

## 2016-09-04 DIAGNOSIS — Z79899 Other long term (current) drug therapy: Secondary | ICD-10-CM

## 2016-09-04 DIAGNOSIS — F418 Other specified anxiety disorders: Secondary | ICD-10-CM | POA: Diagnosis not present

## 2016-09-04 DIAGNOSIS — G5701 Lesion of sciatic nerve, right lower limb: Secondary | ICD-10-CM

## 2016-09-04 DIAGNOSIS — M810 Age-related osteoporosis without current pathological fracture: Secondary | ICD-10-CM | POA: Diagnosis not present

## 2016-09-04 MED ORDER — LIDOCAINE HCL 1 % IJ SOLN
0.3000 mL | INTRAMUSCULAR | Status: AC | PRN
  Administered 2016-09-04: .3 mL

## 2016-09-04 MED ORDER — TRIAMCINOLONE ACETONIDE 40 MG/ML IJ SUSP
10.0000 mg | INTRAMUSCULAR | Status: AC | PRN
  Administered 2016-09-04: 10 mg via INTRAMUSCULAR

## 2016-09-25 DIAGNOSIS — D692 Other nonthrombocytopenic purpura: Secondary | ICD-10-CM | POA: Diagnosis not present

## 2016-09-25 DIAGNOSIS — D229 Melanocytic nevi, unspecified: Secondary | ICD-10-CM | POA: Diagnosis not present

## 2016-09-25 DIAGNOSIS — Z85828 Personal history of other malignant neoplasm of skin: Secondary | ICD-10-CM | POA: Diagnosis not present

## 2016-09-29 ENCOUNTER — Other Ambulatory Visit: Payer: Self-pay

## 2016-09-29 ENCOUNTER — Encounter (HOSPITAL_COMMUNITY): Payer: Self-pay

## 2016-09-29 DIAGNOSIS — Z79899 Other long term (current) drug therapy: Secondary | ICD-10-CM

## 2016-09-29 LAB — COMPLETE METABOLIC PANEL WITH GFR
ALBUMIN: 4 g/dL (ref 3.6–5.1)
ALK PHOS: 44 U/L (ref 33–130)
ALT: 23 U/L (ref 6–29)
AST: 28 U/L (ref 10–35)
BILIRUBIN TOTAL: 0.5 mg/dL (ref 0.2–1.2)
BUN: 9 mg/dL (ref 7–25)
CO2: 28 mmol/L (ref 20–31)
Calcium: 9.7 mg/dL (ref 8.6–10.4)
Chloride: 99 mmol/L (ref 98–110)
Creat: 0.84 mg/dL (ref 0.60–0.93)
GFR, EST AFRICAN AMERICAN: 79 mL/min (ref >=60)
GFR, EST NON AFRICAN AMERICAN: 69 mL/min (ref >=60)
Glucose, Bld: 87 mg/dL (ref 65–99)
POTASSIUM: 4.9 mmol/L (ref 3.5–5.3)
SODIUM: 137 mmol/L (ref 135–146)
TOTAL PROTEIN: 6.4 g/dL (ref 6.1–8.1)

## 2016-09-29 LAB — CBC WITH DIFFERENTIAL/PLATELET
BASOS ABS: 0 {cells}/uL (ref 0–200)
Basophils Relative: 0 %
EOS ABS: 0 {cells}/uL — AB (ref 15–500)
EOS PCT: 0 %
HCT: 41.2 % (ref 35.0–45.0)
Hemoglobin: 13.8 g/dL (ref 11.7–15.5)
Lymphocytes Relative: 7 %
Lymphs Abs: 378 cells/uL — ABNORMAL LOW (ref 850–3900)
MCH: 34.4 pg — AB (ref 27.0–33.0)
MCHC: 33.5 g/dL (ref 32.0–36.0)
MCV: 102.7 fL — AB (ref 80.0–100.0)
MONOS PCT: 11 %
MPV: 9.5 fL (ref 7.5–12.5)
Monocytes Absolute: 594 cells/uL (ref 200–950)
NEUTROS PCT: 82 %
Neutro Abs: 4428 cells/uL (ref 1500–7800)
PLATELETS: 312 10*3/uL (ref 140–400)
RBC: 4.01 MIL/uL (ref 3.80–5.10)
RDW: 14.7 % (ref 11.0–15.0)
WBC: 5.4 10*3/uL (ref 3.8–10.8)

## 2016-09-30 ENCOUNTER — Encounter (HOSPITAL_COMMUNITY)
Admission: RE | Admit: 2016-09-30 | Discharge: 2016-09-30 | Disposition: A | Discharge status: Home / Self Care | Payer: Medicare Other | Source: Ambulatory Visit | Attending: Oculoplastics Ophthalmology | Admitting: Oculoplastics Ophthalmology

## 2016-09-30 ENCOUNTER — Encounter (HOSPITAL_COMMUNITY): Payer: Self-pay

## 2016-09-30 DIAGNOSIS — Z87891 Personal history of nicotine dependence: Secondary | ICD-10-CM | POA: Diagnosis not present

## 2016-09-30 DIAGNOSIS — G35 Multiple sclerosis: Secondary | ICD-10-CM | POA: Diagnosis not present

## 2016-09-30 DIAGNOSIS — I10 Essential (primary) hypertension: Secondary | ICD-10-CM | POA: Diagnosis not present

## 2016-09-30 DIAGNOSIS — H02423 Myogenic ptosis of bilateral eyelids: Secondary | ICD-10-CM | POA: Diagnosis not present

## 2016-09-30 LAB — CBC
HEMATOCRIT: 41.7 % (ref 36.0–46.0)
Hemoglobin: 13.9 g/dL (ref 12.0–15.0)
MCH: 34.3 pg — AB (ref 26.0–34.0)
MCHC: 33.3 g/dL (ref 30.0–36.0)
MCV: 103 fL — AB (ref 78.0–100.0)
PLATELETS: 272 10*3/uL (ref 150–400)
RBC: 4.05 MIL/uL (ref 3.87–5.11)
RDW: 14 % (ref 11.5–15.5)
WBC: 7.6 10*3/uL (ref 4.0–10.5)

## 2016-09-30 LAB — BASIC METABOLIC PANEL
Anion gap: 10 (ref 5–15)
BUN: 8 mg/dL (ref 6–20)
CHLORIDE: 100 mmol/L — AB (ref 101–111)
CO2: 28 mmol/L (ref 22–32)
CREATININE: 0.94 mg/dL (ref 0.44–1.00)
Calcium: 9.7 mg/dL (ref 8.9–10.3)
GFR calc Af Amer: >60 mL/min (ref >60)
GFR calc non Af Amer: 58 mL/min — ABNORMAL LOW (ref >60)
Glucose, Bld: 106 mg/dL — ABNORMAL HIGH (ref 65–99)
POTASSIUM: 4.4 mmol/L (ref 3.5–5.1)
SODIUM: 138 mmol/L (ref 135–145)

## 2016-09-30 NOTE — Pre-Procedure Instructions (Signed)
[place]  09/30/2016      [id number] - [place], [address] [place] [address] Phone: [phone] Fax: [phone]    Your procedure is scheduled on Thursday May 10.  Report to [facility] Admitting at 12:00 P.M.  Call this number if you have problems the morning of surgery:  [phone]   Remember:  Do not eat food or drink liquids after midnight.  Take these medicines the morning of surgery with A SIP OF WATER: prednisone (deltasone), diazepam (valium) if needed, oxycodone if needed OR tramadol (ultram) if needed, tizanidine (zanaflex) if needed  7 days prior to surgery STOP taking any diclofenac (voltaren), Aspirin, Aleve, Naproxen, Ibuprofen, Motrin, Advil, Goody's, BC's, all herbal medications, fish oil, and all vitamins    Do not wear jewelry, make-up or nail polish.   Do not wear lotions, powders, or perfumes, or deoderant.   Do not shave 48 hours prior to surgery.    Do not bring valuables to the hospital.   [facility] is not responsible for  any belongings or valuables.  Contacts, dentures or bridgework may not be worn into surgery.  Leave your suitcase in the car.  After surgery it may be brought to your room.  For patients admitted to the hospital, discharge time will be determined by your treatment team.  Patients discharged the day of surgery will not be allowed to drive home.    Special instructions:    [facility]- Preparing For Surgery  Before surgery, you can play an important role. Because skin is not sterile, your skin needs to be as free of germs as possible. You can reduce the number of germs on your skin by washing with CHG (chlorahexidine gluconate) Soap before surgery.  CHG is an antiseptic cleaner which kills germs and bonds with the skin to continue killing germs even after washing.  Please do not use if you have an allergy to CHG or antibacterial soaps. If your skin becomes  reddened/irritated stop using the CHG.  Do not shave (including legs and underarms) for at least 48 hours prior to first CHG shower. It is OK to shave your face.  Please follow these instructions carefully.   1. Shower the NIGHT BEFORE SURGERY and the MORNING OF SURGERY with CHG.   2. If you chose to wash your hair, wash your hair first as usual with your normal shampoo.  3. After you shampoo, rinse your hair and body thoroughly to remove the shampoo.  4. Use CHG as you would any other liquid soap. You can apply CHG directly to the skin and wash gently with a scrungie or a clean washcloth.   5. Apply the CHG Soap to your body ONLY FROM THE NECK DOWN.  Do not use on open wounds or open sores. Avoid contact with your eyes, ears, mouth and genitals (private parts). Wash genitals (private parts) with your normal soap.  6. Wash thoroughly, paying special attention to the area where your surgery will be performed.  7. Thoroughly rinse your body with warm water from the neck down.  8. DO NOT shower/wash with your normal soap after using and rinsing off the CHG Soap.  9. Pat yourself dry with a CLEAN TOWEL.   10. Wear CLEAN PAJAMAS   11. Place CLEAN SHEETS on your bed the night of your first shower and DO NOT SLEEP WITH PETS.    Day of Surgery: Do not apply any  deodorants/lotions. Please wear clean clothes to the hospital/surgery center.

## 2016-09-30 NOTE — Progress Notes (Signed)
Call to Dr. Tomasita Crumble office, spoke with Roselyn Reef, inquired about preop orders. Roselyn Reef says she will message Dr. Kristeen Miss now to get her to assign orders to this pt.

## 2016-09-30 NOTE — Progress Notes (Signed)
Pt. Followed by [facility] for PCP. Pt. Denies all chest concerns, denies ever having a cardiac consult. Had a stress test she reports long ago with Dr. Linna Darner. Pt. Sees Dr. Felecia Shelling with [facility] for MS & also followed by Dr. Estanislado Pandy for RA.  Methotrexate has recently been lessened due to liver enzymes. Pt. Has multiple drugs & supplements on hold for this surgery.

## 2016-09-30 NOTE — Progress Notes (Signed)
stable °

## 2016-10-02 ENCOUNTER — Ambulatory Visit (HOSPITAL_COMMUNITY)
Admission: RE | Admit: 2016-10-02 | Discharge: 2016-10-02 | Disposition: A | Discharge status: Home / Self Care | Payer: Medicare Other | Source: Ambulatory Visit | Attending: Oculoplastics Ophthalmology | Admitting: Oculoplastics Ophthalmology

## 2016-10-02 ENCOUNTER — Ambulatory Visit (HOSPITAL_COMMUNITY): Payer: Medicare Other | Admitting: Certified Registered"

## 2016-10-02 ENCOUNTER — Encounter (HOSPITAL_COMMUNITY)
Admission: RE | Disposition: A | Discharge status: Home / Self Care | Payer: Self-pay | Source: Ambulatory Visit | Attending: Oculoplastics Ophthalmology

## 2016-10-02 ENCOUNTER — Encounter (HOSPITAL_COMMUNITY): Payer: Self-pay

## 2016-10-02 DIAGNOSIS — Z87891 Personal history of nicotine dependence: Secondary | ICD-10-CM | POA: Insufficient documentation

## 2016-10-02 DIAGNOSIS — E041 Nontoxic single thyroid nodule: Secondary | ICD-10-CM | POA: Diagnosis not present

## 2016-10-02 DIAGNOSIS — G35 Multiple sclerosis: Secondary | ICD-10-CM | POA: Diagnosis not present

## 2016-10-02 DIAGNOSIS — E785 Hyperlipidemia, unspecified: Secondary | ICD-10-CM | POA: Diagnosis not present

## 2016-10-02 DIAGNOSIS — I10 Essential (primary) hypertension: Secondary | ICD-10-CM | POA: Diagnosis not present

## 2016-10-02 DIAGNOSIS — H02423 Myogenic ptosis of bilateral eyelids: Secondary | ICD-10-CM | POA: Diagnosis not present

## 2016-10-02 HISTORY — PX: BROW LIFT: SHX178

## 2016-10-02 HISTORY — PX: PTOSIS REPAIR: SHX6568

## 2016-10-02 SURGERY — REPAIR, BLEPHAROPTOSIS
Anesthesia: General | Site: Eye | Laterality: Bilateral

## 2016-10-02 MED ORDER — EPHEDRINE SULFATE 50 MG/ML IJ SOLN
INTRAMUSCULAR | Status: DC | PRN
  Administered 2016-10-02: 10 mg via INTRAVENOUS

## 2016-10-02 MED ORDER — FENTANYL CITRATE (PF) 250 MCG/5ML IJ SOLN
INTRAMUSCULAR | Status: AC
  Filled 2016-10-02: qty 5

## 2016-10-02 MED ORDER — PHENYLEPHRINE HCL 10 MG/ML IJ SOLN
INTRAMUSCULAR | Status: DC | PRN
  Administered 2016-10-02: 120 ug via INTRAVENOUS

## 2016-10-02 MED ORDER — BSS IO SOLN
INTRAOCULAR | Status: AC
  Filled 2016-10-02: qty 15

## 2016-10-02 MED ORDER — PROPARACAINE HCL 0.5 % OP SOLN
1.0000 [drp] | OPHTHALMIC | Status: DC | PRN
  Administered 2016-10-02 (×3): 1 [drp] via OPHTHALMIC
  Filled 2016-10-02: qty 15

## 2016-10-02 MED ORDER — FENTANYL CITRATE (PF) 100 MCG/2ML IJ SOLN
INTRAMUSCULAR | Status: AC
  Filled 2016-10-02: qty 2

## 2016-10-02 MED ORDER — VANCOMYCIN HCL IN DEXTROSE 1-5 GM/200ML-% IV SOLN
INTRAVENOUS | Status: AC
  Filled 2016-10-02: qty 200

## 2016-10-02 MED ORDER — FENTANYL CITRATE (PF) 100 MCG/2ML IJ SOLN
25.0000 ug | INTRAMUSCULAR | Status: DC | PRN
  Administered 2016-10-02 (×2): 25 ug via INTRAVENOUS

## 2016-10-02 MED ORDER — PROPOFOL 10 MG/ML IV BOLUS
INTRAVENOUS | Status: AC
  Filled 2016-10-02: qty 20

## 2016-10-02 MED ORDER — DEXAMETHASONE SODIUM PHOSPHATE 10 MG/ML IJ SOLN
INTRAMUSCULAR | Status: DC | PRN
  Administered 2016-10-02: 10 mg via INTRAVENOUS

## 2016-10-02 MED ORDER — PHENYLEPHRINE HCL 10 MG/ML IJ SOLN
INTRAVENOUS | Status: DC | PRN
  Administered 2016-10-02: 20 ug/min via INTRAVENOUS

## 2016-10-02 MED ORDER — LIDOCAINE HCL (CARDIAC) 20 MG/ML IV SOLN
INTRAVENOUS | Status: DC | PRN
  Administered 2016-10-02: 60 mg via INTRAVENOUS

## 2016-10-02 MED ORDER — DEXAMETHASONE SODIUM PHOSPHATE 10 MG/ML IJ SOLN
INTRAMUSCULAR | Status: AC
  Filled 2016-10-02: qty 1

## 2016-10-02 MED ORDER — ONDANSETRON HCL 4 MG/2ML IJ SOLN
INTRAMUSCULAR | Status: DC | PRN
  Administered 2016-10-02: 4 mg via INTRAVENOUS

## 2016-10-02 MED ORDER — MIDAZOLAM HCL 2 MG/2ML IJ SOLN
INTRAMUSCULAR | Status: AC
  Filled 2016-10-02: qty 2

## 2016-10-02 MED ORDER — TOBRAMYCIN-DEXAMETHASONE 0.3-0.1 % OP OINT
TOPICAL_OINTMENT | OPHTHALMIC | Status: AC
  Filled 2016-10-02: qty 3.5

## 2016-10-02 MED ORDER — ONDANSETRON HCL 4 MG/2ML IJ SOLN
INTRAMUSCULAR | Status: AC
  Filled 2016-10-02: qty 2

## 2016-10-02 MED ORDER — SUCCINYLCHOLINE CHLORIDE 200 MG/10ML IV SOSY
PREFILLED_SYRINGE | INTRAVENOUS | Status: AC
  Filled 2016-10-02: qty 10

## 2016-10-02 MED ORDER — PREDNISONE 10 MG PO TABS: 20.0000 mg | ORAL_TABLET | Freq: Every day | ORAL | 0 refills | Status: DC

## 2016-10-02 MED ORDER — BUPIVACAINE HCL (PF) 0.5 % IJ SOLN
INTRAMUSCULAR | Status: AC
  Filled 2016-10-02: qty 30

## 2016-10-02 MED ORDER — TETRACAINE HCL 0.5 % OP SOLN
OPHTHALMIC | Status: AC
  Filled 2016-10-02: qty 2

## 2016-10-02 MED ORDER — LIDOCAINE-EPINEPHRINE 1 %-1:100000 IJ SOLN
INTRAMUSCULAR | Status: AC
Start: 2016-10-02 — End: 2016-10-02
  Filled 2016-10-02: qty 1

## 2016-10-02 MED ORDER — FENTANYL CITRATE (PF) 100 MCG/2ML IJ SOLN
INTRAMUSCULAR | Status: DC | PRN
  Administered 2016-10-02 (×4): 50 ug via INTRAVENOUS

## 2016-10-02 MED ORDER — PHENYLEPHRINE HCL 2.5 % OP SOLN
OPHTHALMIC | Status: AC
  Filled 2016-10-02: qty 2

## 2016-10-02 MED ORDER — PROPOFOL 10 MG/ML IV BOLUS
INTRAVENOUS | Status: DC | PRN
  Administered 2016-10-02: 90 mg via INTRAVENOUS

## 2016-10-02 MED ORDER — SODIUM CHLORIDE 0.9 % IV SOLN
INTRAVENOUS | Status: DC
  Administered 2016-10-02 (×2): via INTRAVENOUS

## 2016-10-02 MED ORDER — VANCOMYCIN HCL 1000 MG IV SOLR
INTRAVENOUS | Status: DC | PRN
  Administered 2016-10-02: 15:00:00 via INTRAVENOUS
  Administered 2016-10-02: 1000 mg via INTRAVENOUS

## 2016-10-02 MED ORDER — LIDOCAINE 2% (20 MG/ML) 5 ML SYRINGE
INTRAMUSCULAR | Status: AC
  Filled 2016-10-02: qty 5

## 2016-10-02 MED ORDER — LIDOCAINE-EPINEPHRINE 1 %-1:100000 IJ SOLN
INTRAMUSCULAR | Status: DC | PRN
  Administered 2016-10-02: 5 mL via INTRAMUSCULAR

## 2016-10-02 MED ORDER — SUCCINYLCHOLINE CHLORIDE 20 MG/ML IJ SOLN
INTRAMUSCULAR | Status: DC | PRN
  Administered 2016-10-02: 100 mg via INTRAVENOUS

## 2016-10-02 MED ORDER — MIDAZOLAM HCL 5 MG/5ML IJ SOLN
INTRAMUSCULAR | Status: DC | PRN
  Administered 2016-10-02: 2 mg via INTRAVENOUS

## 2016-10-02 SURGICAL SUPPLY — 32 items
APL SRG 3 HI ABS STRL LF PLS (MISCELLANEOUS) ×1
APPLICATOR COTTON TIP 6IN STRL (MISCELLANEOUS) ×9 IMPLANT
APPLICATOR DR MATTHEWS STRL (MISCELLANEOUS) ×3 IMPLANT
BLADE SURG 15 STRL LF DISP TIS (BLADE) IMPLANT
BLADE SURG 15 STRL SS (BLADE) ×3
CLEANER TIP ELECTROSURG 2X2 (MISCELLANEOUS) ×2 IMPLANT
CLOSURE WOUND 1/2 X4 (GAUZE/BANDAGES/DRESSINGS) ×1
CORDS BIPOLAR (ELECTRODE) ×2 IMPLANT
COVER SURGICAL LIGHT HANDLE (MISCELLANEOUS) ×3 IMPLANT
DRAPE ORTHO SPLIT 87X125 STRL (DRAPES) ×3 IMPLANT
DRAPE SURG 17X23 STRL (DRAPES) ×6 IMPLANT
ELECT NDL BLADE 2-5/6 (NEEDLE) IMPLANT
ELECT NEEDLE BLADE 2-5/6 (NEEDLE) ×3 IMPLANT
FORCEPS BIPOLAR SPETZLER 8 1.0 (NEUROSURGERY SUPPLIES) ×2 IMPLANT
GLOVE BIO SURGEON STRL SZ7.5 (GLOVE) ×6 IMPLANT
GOWN STRL REUS W/ TWL LRG LVL3 (GOWN DISPOSABLE) ×2 IMPLANT
GOWN STRL REUS W/TWL LRG LVL3 (GOWN DISPOSABLE) ×9
NDL PRECISIONGLIDE 27X1.5 (NEEDLE) IMPLANT
NEEDLE PRECISIONGLIDE 27X1.5 (NEEDLE) IMPLANT
NS IRRIG 1000ML POUR BTL (IV SOLUTION) ×3 IMPLANT
PACK CATARACT CUSTOM (CUSTOM PROCEDURE TRAY) ×3 IMPLANT
PAD ARMBOARD 7.5X6 YLW CONV (MISCELLANEOUS) ×6 IMPLANT
PENCIL BUTTON BLDE SNGL 10FT (ELECTRODE) ×2 IMPLANT
STRIP CLOSURE SKIN 1/2X4 (GAUZE/BANDAGES/DRESSINGS) ×2 IMPLANT
SUT CHROMIC 5 0 P 3 (SUTURE) ×2 IMPLANT
SUT ETHILON 7 0 P 6 18 (SUTURE) ×2 IMPLANT
SUT MERSILENE 5 0 RD 1 DA (SUTURE) ×4 IMPLANT
SUT PLAIN 5 0 P 3 18 (SUTURE) ×3 IMPLANT
SUT SILK 4 0 P 3 (SUTURE) ×9 IMPLANT
TAPE CLOTH 1X10 TAN NS (GAUZE/BANDAGES/DRESSINGS) ×2 IMPLANT
TOWEL OR 17X24 6PK STRL BLUE (TOWEL DISPOSABLE) ×6 IMPLANT
WATER STERILE IRR 1000ML POUR (IV SOLUTION) ×3 IMPLANT

## 2016-10-02 NOTE — Transfer of Care (Signed)
Immediate Anesthesia Transfer of Care Note  Patient: Sherri [name]  Procedure(s) Performed: Procedure(s): INTERNAL PTOSIS REPAIR (Bilateral) BILATERAL LOWER LID BLEPHAROPLASTY (Bilateral)  Patient Location: PACU  Anesthesia Type:General  Level of Consciousness: awake and patient cooperative  Airway & Oxygen Therapy: Patient Spontanous Breathing  Post-op Assessment: Report given to RN and Post -op Vital signs reviewed and stable  Post vital signs: Reviewed and stable  Last Vitals:  Vitals:   10/02/16 1138  BP: (!) 111/49  Pulse: 88  Resp: 18  Temp: 36.6 C    Last Pain:  Vitals:   10/02/16 1152  TempSrc:   PainSc: 4       Patients Stated Pain Goal: 2 (81/10/31 5945)  Complications: No apparent anesthesia complications

## 2016-10-02 NOTE — Anesthesia Procedure Notes (Signed)
Procedure Name: Intubation Date/Time: 10/02/2016 2:05 PM Performed by: Lance Coon Pre-anesthesia Checklist: Emergency Drugs available, Patient identified, Suction available, Patient being monitored and Timeout performed Patient Re-evaluated:Patient Re-evaluated prior to inductionOxygen Delivery Method: Circle system utilized Preoxygenation: Pre-oxygenation with 100% oxygen Intubation Type: IV induction Ventilation: Mask ventilation without difficulty Laryngoscope Size: Miller and 3 Grade View: Grade II Tube type: Oral Tube size: 7.5 mm Number of attempts: 1 Airway Equipment and Method: Stylet Placement Confirmation: ETT inserted through vocal cords under direct vision,  positive ETCO2 and breath sounds checked- equal and bilateral Secured at: 21 cm Tube secured with: Tape Dental Injury: Teeth and Oropharynx as per pre-operative assessment

## 2016-10-02 NOTE — Interval H&P Note (Signed)
History and Physical Interval Note:  10/02/2016 1:36 PM  Sherri [name]  has presented today for surgery, with the diagnosis of MYOGENIC PTOSIS OF EYE LIDS BILATERAL  The various methods of treatment have been discussed with the patient and family. After consideration of risks, benefits and other options for treatment, the patient has consented to  Procedure(s): INTERNAL PTOSIS REPAIR (Bilateral) BILATERAL LOWER LID BLEPHAROPLASTY (Bilateral) as a surgical intervention .  The patient's history has been reviewed, patient examined, no change in status, stable for surgery.  I have reviewed the patient's chart and labs.  Questions were answered to the patient's satisfaction.     Clista Bernhardt

## 2016-10-02 NOTE — Brief Op Note (Signed)
10/02/2016  9:09 AM  PATIENT:  Clay  76 y.o. female  PRE-OPERATIVE DIAGNOSIS:  MYOGENIC PTOSIS OF EYE LIDS BILATERAL, COSMETIC CONCERNS AND RHYTIDS  POST-OPERATIVE DIAGNOSIS:  * No post-op diagnosis entered *  PROCEDURE:  Procedure(s): INTERNAL PTOSIS REPAIR (Bilateral) BILATERAL LOWER LID BLEPHAROPLASTY (Bilateral)  SURGEON:  Surgeon(s) and Role:    * Remmi Armenteros, Peyton Najjar, MD - Primary  PHYSICIAN ASSISTANT:   ASSISTANTS: none   ANESTHESIA:   general  EBL:  No intake/output data recorded.  BLOOD ADMINISTERED:none  DRAINS: none   LOCAL MEDICATIONS USED:  BUPIVICAINE  and LIDOCAINE   SPECIMEN:  No Specimen  DISPOSITION OF SPECIMEN:  N/A  COUNTS:  YES  TOURNIQUET:  * No tourniquets in log *  DICTATION: .Note written in EPIC  PLAN OF CARE: Discharge to home after PACU  PATIENT DISPOSITION:  PACU - hemodynamically stable.   Delay start of Pharmacological VTE agent (>24hrs) due to surgical blood loss or risk of bleeding: yes

## 2016-10-02 NOTE — Anesthesia Preprocedure Evaluation (Addendum)
Anesthesia Evaluation  Patient identified by MRN, date of birth, ID band Patient awake    Reviewed: Allergy & Precautions, H&P , NPO status , Patient's Chart, lab work & pertinent test results  History of Anesthesia Complications (+) PONV  Airway Mallampati: II  TM Distance: >3 FB Neck ROM: Full    Dental no notable dental hx. (+) Teeth Intact, Dental Advisory Given   Pulmonary neg pulmonary ROS, former smoker,    Pulmonary exam normal breath sounds clear to auscultation       Cardiovascular hypertension, On Medications  Rhythm:Regular Rate:Normal     Neuro/Psych  Headaches, Anxiety Multiple sclerosis  Neuromuscular disease    GI/Hepatic Neg liver ROS, GERD  ,  Endo/Other  negative endocrine ROS  Renal/GU negative Renal ROS  negative genitourinary   Musculoskeletal  (+) Arthritis , Rheumatoid disorders,    Abdominal   Peds  Hematology negative hematology ROS (+)   Anesthesia Other Findings   Reproductive/Obstetrics negative OB ROS                            Anesthesia Physical Anesthesia Plan  ASA: II  Anesthesia Plan: General   Post-op Pain Management:    Induction: Intravenous  Airway Management Planned: Oral ETT  Additional Equipment:   Intra-op Plan:   Post-operative Plan: Extubation in OR  Informed Consent: I have reviewed the patients History and Physical, chart, labs and discussed the procedure including the risks, benefits and alternatives for the proposed anesthesia with the patient or authorized representative who has indicated his/her understanding and acceptance.   Dental advisory given  Plan Discussed with: CRNA  Anesthesia Plan Comments:        Anesthesia Quick Evaluation

## 2016-10-02 NOTE — Op Note (Signed)
Procedure(s): INTERNAL PTOSIS REPAIR BILATERAL LOWER LID BLEPHAROPLASTY Procedure Note  Sherri [name] female 76 y.o. 10/02/2016  Procedure(s) and Anesthesia Type:    * INTERNAL PTOSIS REPAIR - General    * BILATERAL LOWER LID BLEPHAROPLASTY - General  Surgeon(s) and Role:    * Clista Bernhardt, MD - Primary   Indications: The patient was admitted to the hospital with a brief history of [place]. An ophthalmology exam revealed the above findings. The patient now presents for bilateral internal ptosis repair and bilateral lower eyelid blepharoplasty after discussing therapeutic alternatives.        Surgeon: Clista Bernhardt   Assistants: none  Anesthesia: General endotracheal anesthesia  ASA Class: 3    Procedure Detail  INTERNAL PTOSIS REPAIR, BILATERAL LOWER LID BLEPHAROPLASTY  INDICATIONS:  This patient presents with bilateral upper eyelid position that is too low (MRD1 < 2) and good levator function. The position of the eyelid is causing visual dysfunction that interferes with tasks of daily living including reading and computer use due to the superiorvisual field defect.  Informed consent had been provided prior to the day of surgery at the clinic visit. A handout was provided and the patient was asked to sign an eyelid informed consent specific to this procedure. All questions were answered.  The patient understands that the goal of surgery is to improve visual function. This is not a cosmetic procedure, no skin or fat is being removed. Prior to entering the operative suite, the general surgical consent was signed. All questions were answered.  The patient understands the risks of surgery include but are not limited to bleeding, infection, scar formation, asymmetry, the need for additional surgical intervention and other less common outcomes noted on the specific eyelid surgery consent and anesthesia risks listed on the  anesthesia consent. The patient accepts the risks and desires to proceed with surgery as the position of the upper eyelid blocks the superior visual field interrupting tasks of daily living to include reading, driving and computer use.   PROCEDURE :  The patient was taken to the operating room and placed in the supine position with the usual monitoring in place. The patient was given a drop of Proparacaine 0.5%, then prepped and draped in the usual standard fashion. The lateral eyelid was injected with standard oculoplastic block in the region of entry and exit for the running suture. The eyelid was then everted over a desmarres retractor and injected with the standard oculoplastic block.    In clinic, the patient had complete correction with one drop of phenylephrine. The goal was to perform a 7 mm tuck in the conjunctiva, so the conjunctiva was marked at 3.5 mm from the tarsal border in a medial, central and lateral position with three interrupted 6-0 silk sutures which were then secured with a steristrip. The sutures were then pulled inferiorly and the putterman ptosis clamp was placed in standard fashion.    Using the Putterman ptosis clamp to provide traction, a 5-0 gut suture was passed through the upper lid laterally approximately 2 mm from the eyelashes and passed in a running fashion from lateral aspect of the Putterman ptosis clamp medially and back again laterally and then brought out through the skin adjacent to the entry location and tied externally to avoid corneal irritation.  The silk suture and approximately 1 mm of conjunctiva and mueller's muscle was excised in such a fashion to create a smooth internal wound edge. The external knot  was then covered with a 1 cm steristrip.  Attention was turned to the other eyelid, where the same procedure was performed.   BILATERAL LOWER EYELID BLEPHAROPLASTY   Cosmetic Lower Eyelid Blepharoplasty INDICATIONS:  The patient desires to undergo a lower  lid blepharoplasty to dissolve and reposition the lower lid Preseptal fat.   The patient understands the risks of surgery include but are not limited to bleeding, infection, scar formation, asymmetry, the need for additional surgical intervention and other less common outcomes noted on the specific eyelid informed consent and anesthesia risks listed on the anesthesia consent. The patient accepts the risks and desires to proceed with surgery as the position of the eyelids on the eyelashes is irritated and blocking the superior visual field interrupting tasks of daily living to include reading, driving and computer use.         Attention was then directed to the right lower eyelid which had been previously injected with the standard oculoplastic block.  A standard bovie incision was performed after 6-0 nylon suture was placed through the gray line to allow inferior retraction of the lower eyelid. The dissection was carried out to the level of the fat pads. The fat was sculpted and repositioned as needed to achieve excellent contour. The periosteum was released at the level of the arcus marginalis to ablate the demarcation and improve the lower eyelid appearance. Hemostasis was achieved with a combination of monopolar and bipolar cautery. The fat was repositioned over the arcus. A 4-0 undyed vicryl suture was taken through the inferior and super crus of the lateral canthus and the secured within the blepharoplasty incision to the periosteum of the lateral orbital rim. The same procedure was performed on the contralateral eye.   Attention was turned to the contralateral eye where the same procedure was performed.  The patient was taken to the PACU in good condition.   Findings: [inone]  Estimated Blood Loss:  Minimal         Drains: none         Total IV Fluids: 1L  Blood Given: none          Specimens   none         Implants: none        Complications:  * No complications entered in OR log *          Disposition: PACU - hemodynamically stable.         Condition: stable

## 2016-10-02 NOTE — H&P (Signed)
Subjective:    Sherri [name] is a 76 y.o. 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 The pain is described as none. Onset was a few months ago. Symptoms have been unchanged since.   Review of Systems Pertinent items noted in HPI and remainder of comprehensive ROS otherwise negative.    Objective:   There were no vitals taken for this visit.  General:  alert Skin:  normal Eyes: positive findings: eyelids/periorbital: ptosis bilaterally Mouth: MMM no lesions Lymph Nodes:  Cervical, supraclavicular, and axillary nodes normal. Lungs:  clear to auscultation bilaterally Heart:  regular rate and rhythm, S1, S2 normal, no murmur, click, rub or gallop Abdomen: soft, non-tender; bowel sounds normal; no masses,  no organomegaly CVA:  absent Genitourinary: defer exam Extremities:  extremities normal, atraumatic, no cyanosis or edema Neurologic:  negative Psychiatric:  normal mood, behavior, speech, dress, and thought processes    Assessment: Myogenic Ptosis and Cosmetic Concerns/Rhytids   Plan:  Bilateral Internal Ptosis Repair and Bilateral Lower Eyelid Blepharoplasty  1. Discussed the risk of surgery,  and the risks of general anesthetic including MI, CVA, sudden death or even reaction to anesthetic medications. The patient understands the risks, any and all questions were answered to the patient's satisfaction. 2. Follow up: 1 day.  Date of Surgery Update (To be completed by Attending Surgeon day of surgery.)

## 2016-10-02 NOTE — Discharge Instructions (Signed)
Patient is to keep dressing on until seen by provider. Patient should start icing every 2 hours for 20-30 minutes for the first 48 hrs.

## 2016-10-03 ENCOUNTER — Encounter (HOSPITAL_COMMUNITY): Payer: Self-pay | Admitting: Oculoplastics Ophthalmology

## 2016-10-03 NOTE — Anesthesia Postprocedure Evaluation (Addendum)
Anesthesia Post Note  Patient: Sherri [name]  Procedure(s) Performed: Procedure(s) (LRB): INTERNAL PTOSIS REPAIR (Bilateral) BILATERAL LOWER LID BLEPHAROPLASTY (Bilateral)  Patient location during evaluation: PACU Anesthesia Type: General Level of consciousness: awake, sedated and oriented Pain management: pain level controlled Vital Signs Assessment: post-procedure vital signs reviewed and stable Respiratory status: spontaneous breathing, nonlabored ventilation, respiratory function stable and patient connected to nasal cannula oxygen Cardiovascular status: blood pressure returned to baseline and stable Postop Assessment: no signs of nausea or vomiting Anesthetic complications: no       Last Vitals:  Vitals:   10/02/16 1652 10/02/16 1653  BP: 122/68 122/68  Pulse: 90   Resp: 10   Temp: 36.1 C     Last Pain:  Vitals:   10/02/16 1152  TempSrc:   PainSc: 4                  Gabreille Dardis,JAMES TERRILL

## 2016-10-06 ENCOUNTER — Ambulatory Visit: Payer: Medicare Other | Admitting: Internal Medicine

## 2016-10-13 ENCOUNTER — Other Ambulatory Visit: Payer: Self-pay | Admitting: Rheumatology

## 2016-10-13 NOTE — Telephone Encounter (Signed)
Last Visit: 09/04/16 Next Visit: 01/08/17 Labs: 09/29/16 Stable  Okay to refill MTX?

## 2016-10-13 NOTE — Telephone Encounter (Signed)
ok 

## 2016-10-21 ENCOUNTER — Telehealth: Payer: Self-pay | Admitting: Neurology

## 2016-10-21 ENCOUNTER — Ambulatory Visit (INDEPENDENT_AMBULATORY_CARE_PROVIDER_SITE_OTHER): Payer: Medicare Other | Admitting: Internal Medicine

## 2016-10-21 ENCOUNTER — Encounter: Payer: Self-pay | Admitting: Internal Medicine

## 2016-10-21 VITALS — BP 130/76 | HR 67 | Temp 97.8°F | Resp 16 | Wt 113.0 lb

## 2016-10-21 DIAGNOSIS — G35 Multiple sclerosis: Secondary | ICD-10-CM

## 2016-10-21 DIAGNOSIS — E7849 Other hyperlipidemia: Secondary | ICD-10-CM

## 2016-10-21 DIAGNOSIS — M0579 Rheumatoid arthritis with rheumatoid factor of multiple sites without organ or systems involvement: Secondary | ICD-10-CM

## 2016-10-21 DIAGNOSIS — E784 Other hyperlipidemia: Secondary | ICD-10-CM | POA: Diagnosis not present

## 2016-10-21 DIAGNOSIS — I10 Essential (primary) hypertension: Secondary | ICD-10-CM | POA: Diagnosis not present

## 2016-10-21 NOTE — Telephone Encounter (Signed)
Patient called office requesting refills for Oxycodone HCl 10 MG TABS, diazepam (VALIUM) 5 MG tablet, and traMADol (ULTRAM) 50 MG tablet

## 2016-10-21 NOTE — Patient Instructions (Addendum)
  Medications reviewed and updated.  No changes recommended at this time.     Please followup in 6 months   

## 2016-10-21 NOTE — Assessment & Plan Note (Signed)
Lipid higher due Xeljanz Continue statin - taking 1 twice a week

## 2016-10-21 NOTE — Assessment & Plan Note (Signed)
BP well controlled Current regimen effective and well tolerated Continue current medications at current doses  

## 2016-10-21 NOTE — Assessment & Plan Note (Addendum)
Taking prednisone - dose has been decreased slightly Taking methotrexate Taking oxycodone at night for pain Following with Dr Estanislado Pandy

## 2016-10-21 NOTE — Assessment & Plan Note (Addendum)
Following with Dr Felecia Shelling Taking oxycodone for pain Taking valium for muscle spasms

## 2016-10-21 NOTE — Progress Notes (Signed)
Subjective:    Patient ID: Sherri [name], female    DOB: 1940/08/01, 76 y.o.   MRN: [id number]  HPI The patient is here for follow up.  Hypertension: She is taking her medication daily. She is compliant with a low sodium diet.  She denies chest pain, palpitations, edema, shortness of breath and regular headaches. She is exercising regularly - has played some golf, has done some dancing.  She does not monitor her blood pressure at home.    multtiple sclerosis: she is following with neurology.  Her balance is getting worse the old she gets.    RA:  She is taking prednisone daily.    Hyperlipidemia: She is taking her medication daily. She is compliant with a low fat/cholesterol diet. She is exercising some.     Medications and allergies reviewed with patient and updated if appropriate.  Patient Active Problem List   Diagnosis Date Noted  . High risk medication use 09/02/2016  . Urinary frequency 09/02/2016  . Piriformis muscle pain 09/02/2016  . Fatigue 07/22/2016  . Hair loss 07/22/2016  . Right hip pain 04/08/2016  . Right knee pain 02/25/2016  . Dyspepsia 04/05/2015  . Numbness 11/14/2014  . Biceps tendon tear 10/10/2014  . Insomnia 10/10/2014  . Gait disturbance 06/13/2014  . 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 OP (osteoporosis) 01/18/2014  . Other long term (current) drug therapy 01/18/2014  . Other dysphagia 12/29/2013  . Hoarseness 12/29/2013  . 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 Personal history of colonic polyps 07/03/2010  . Nocturia 05/28/2010  . THYROID NODULE 05/17/2008  . Multiple sclerosis ([place]) 05/17/2008  . Chronic fatigue 03/30/2007  . Hyperlipidemia 08/20/2006  . Essential hypertension 08/20/2006  . Rheumatoid arthritis ([place]) 08/20/2006  . CERVICAL CANCER, HX OF 08/20/2006  . GASTRIC ULCER, HX OF 08/20/2006    Current Outpatient Prescriptions on File Prior to Visit    Medication Sig Dispense Refill  . atorvastatin (LIPITOR) 10 MG tablet Take 1 tablet by mouth on Mondays and Friday 27 tablet 2  . Biotin 5000 MCG TABS Take 5,000 mcg by mouth daily.     . Calcium Carbonate-Vitamin D (CALCIUM + D PO) Take 1 tablet by mouth daily.     . Carboxymethylcellul-Glycerin (LUBRICATING EYE DROPS OP) Apply 1 drop to eye daily as needed (dry eyes).    . Cholecalciferol (VITAMIN D3) 2000 units capsule Take 2,000 Units by mouth daily.    . Cyanocobalamin (VITAMIN B-12 PO) Take 1 tablet by mouth daily.    . diazepam (VALIUM) 5 MG tablet Take 1 tablet (5 mg total) by mouth every 8 (eight) hours as needed for anxiety. (Patient taking differently: Take 5 mg by mouth at bedtime. ) 90 tablet 0  . diclofenac sodium (VOLTAREN) 1 % GEL APPLY 3 GRAMS TO 3 LARGE JOINTS 3 TIMES A DAY AS NEEDED (Patient taking differently: APPLY 3 GRAMS TO 3 LARGE JOINTS 3 TIMES A DAY AS NEEDED FOR PAIN) 829 g 3  . folic acid (FOLVITE) 1 MG tablet Take 1 mg by mouth daily.     . furosemide (LASIX) 20 MG tablet TAKE 1 TABLET BY MOUTH EVERY DAY 90 tablet 1  . methotrexate (RHEUMATREX) 2.5 MG tablet TAKE 6 TABLETS (15 MG TOTAL) BY MOUTH ONCE A WEEK. CAUTION:CHEMOTHERAPY. PROTECT FROM LIGHT. 72 tablet 0  . Omega-3 Fatty Acids (FISH OIL) 1000 MG CAPS Take 1,000 mg by mouth daily.    [name]  Oxycodone HCl 10 MG TABS Take 1 tablet (10 mg total) by mouth 3 (three) times daily as needed. (Patient taking differently: Take 10 mg by mouth at bedtime. ) 90 tablet 0  . predniSONE (DELTASONE) 1 MG tablet TAKE 4 TABLETS (4 MG TOTAL) BY MOUTH DAILY WITH BREAKFAST. 360 tablet 0  . Pyridoxine HCl (VITAMIN B-6 PO) Take 1 tablet by mouth daily.    . ramipril (ALTACE) 5 MG capsule Take 1 capsule (5 mg total) by mouth daily with breakfast. 90 capsule 2  . tiZANidine (ZANAFLEX) 4 MG capsule Take 4 mg by mouth 3 (three) times daily as needed for muscle spasms.    . Tofacitinib Citrate (XELJANZ XR) 11 MG TB24 Take 11 mg by mouth daily.  60 tablet 0  . traMADol (ULTRAM) 50 MG tablet Take 1 tablet (50 mg total) by mouth 4 (four) times daily. (Patient taking differently: Take 50 mg by mouth daily as needed for moderate pain. ) 120 tablet 3   No current facility-administered medications on file prior to visit.     Past Medical History:  Diagnosis Date  . Anxiety    takes Valium daily as needed  . Bruises easily    d/t meds  . Cancer ([place])    basal cell ca, in situ- uterine   . Cataracts, bilateral   . Chronic back pain   . Dizziness    r/t to meds  . GERD (gastroesophageal reflux disease)    no meds on a regular basis but will take Tums if needed  . Headache(784.0)    r/t neck issues  . History of bronchitis 6-59yrs ago  . History of colon polyps    benign  . Hyperlipidemia    takes Atorvastatin on Mondays and Fridays  . Hypertension    takes Ramipril daily  . Joint pain   . Joint swelling   . Multiple sclerosis ([place])   . Neuromuscular disorder ([place])    Dr. Park Liter- [facility], follows M.S.  . Nocturia   . OA (osteoarthritis)    Dr Jani Gravel weekly, RA- hands- knees- feet   . PONV (postoperative nausea and vomiting)    trouble urinating after surgery in 2014  . Rheumatoid arthritis(714.0)    Dr Estanislado Pandy takes [name] daily  . Skin cancer   . Urinary retention    sees Dr.Wrenn about 2 times a yr    Past Surgical History:  Procedure Laterality Date  . ABDOMINAL HYSTERECTOMY     1985  . APPENDECTOMY     with TAH  . BACK SURGERY     several  . BROW LIFT Bilateral 10/02/2016   Procedure: BILATERAL LOWER LID BLEPHAROPLASTY;  Surgeon: Clista Bernhardt, MD;  Location: [place];  Service: Plastics;  Laterality: Bilateral;  . CARPAL TUNNEL RELEASE Right   . CERVICAL FUSION     Dr Joya Salm  . CERVICAL FUSION  12/31/2012   Dr Joya Salm  . CHEST TUBE INSERTION     for traumatic Pneumothorax  . COLONOSCOPY  07/16/2010   normal   . COLONOSCOPY W/ POLYPECTOMY  1997   negative since; Dr Deatra Ina   . ESOPHAGOGASTRODUODENOSCOPY  07/16/2010   normal  . EYE SURGERY Bilateral    cataracts removed - /w IOL  . LUMBAR FUSION     Dr Joya Salm  . NASAL SINUS SURGERY    . POSTERIOR CERVICAL FUSION/FORAMINOTOMY N/A 12/31/2012   Procedure: POSTERIOR LATERAL CERVICAL FUSION/FORAMINOTOMY LEVEL 1 CERVICAL THREE-FOUR WITH LATERAL MASS SCREWS;  Surgeon: Stann Mainland  Andres Shad, MD;  Location: [facility];  Service: Neurosurgery;  Laterality: N/A;  . PTOSIS REPAIR Bilateral 10/02/2016   Procedure: INTERNAL PTOSIS REPAIR;  Surgeon: Clista Bernhardt, MD;  Location: [place];  Service: Plastics;  Laterality: Bilateral;  . SKIN BIOPSY    . THYROID SURGERY     R lobe removed- 1972, has grown back - CT last done- 2018  . TOTAL HIP ARTHROPLASTY  12/22/2011   Procedure: TOTAL HIP ARTHROPLASTY;  Surgeon: Kerin Salen, MD;  Location: [place];  Service: Orthopedics;  Laterality: Left;  . TUBAL LIGATION      Social History   Social History  . Marital status: Married    Spouse name: N/A  . Number of children: N/A  . Years of education: N/A   Occupational History  . retired    Social History Main Topics  . Smoking status: Former Smoker    Quit date: 05/26/1981  . Smokeless tobacco: Never Used     Comment: Smoked [phone], up to 2.5 ppd  . Alcohol use 0.0 oz/week     Comment: 2-3 beers every day 2%  . Drug use: No  . Sexual activity: Yes   Other Topics Concern  . Not on file   Social History Narrative  . No narrative on file    Family History  Problem Relation Age of Onset  . Cancer Mother        pancreatic  . Diabetes Mother   . Heart disease Father        Rheumatic  . Cancer Father        ? stomach  . Cancer Sister        stomach  . Cancer Maternal Aunt        X 4; ? primary  . Heart disease Paternal Aunt   . Cancer Maternal Grandmother        cervical  . Colon cancer Unknown        Aunts  . Multiple sclerosis Daughter   . Hypertension Neg Hx   . Stroke Neg Hx     Review of Systems   Constitutional: Negative for chills and fever.       Energy level comes and goes  HENT: Positive for trouble swallowing (intermittent).   Respiratory: Negative for cough, shortness of breath and wheezing.   Cardiovascular: Negative for chest pain, palpitations and leg swelling.  Gastrointestinal: Positive for constipation.       No gerd  Neurological: Positive for light-headedness (positional). Negative for headaches.       Objective:   Vitals:   10/21/16 1537  BP: 130/76  Pulse: 67  Resp: 16  Temp: 97.8 F (36.6 C)   Wt Readings from Last 3 Encounters:  10/21/16 113 lb (51.3 kg)  10/02/16 112 lb (50.8 kg)  09/30/16 112 lb 4.8 oz (50.9 kg)   Body mass index is 20.02 kg/m.   Physical Exam    Constitutional: Appears well-developed and well-nourished. No distress.  HENT:  Head: Normocephalic and atraumatic.  Neck: Neck supple. No tracheal deviation present. No thyromegaly present.  No cervical lymphadenopathy Cardiovascular: Normal rate, regular rhythm and normal heart sounds.   No murmur heard. No carotid bruit .  No edema Pulmonary/Chest: Effort normal and breath sounds normal. No respiratory distress. No has no wheezes. No rales.  Skin: Skin is warm and dry. Not diaphoretic.  Psychiatric: Normal mood and affect. Behavior is normal.      Assessment & Plan:  See Problem List for Assessment and Plan of chronic medical problems.

## 2016-10-22 MED ORDER — TRAMADOL HCL 50 MG PO TABS: 50.0000 mg | ORAL_TABLET | Freq: Four times a day (QID) | ORAL | 3 refills | Status: DC

## 2016-10-22 MED ORDER — OXYCODONE HCL 10 MG PO TABS: 10.0000 mg | ORAL_TABLET | Freq: Three times a day (TID) | ORAL | 0 refills | Status: DC | PRN

## 2016-10-22 NOTE — Addendum Note (Signed)
Addended by: France Ravens I on: 10/22/2016 08:07 AM   Modules accepted: Orders

## 2016-10-22 NOTE — Telephone Encounter (Signed)
Rx's up front [facility]/fim 

## 2016-10-22 NOTE — Telephone Encounter (Signed)
Rx's awaiting YY's sig, as RAS is ooo today/fim

## 2016-10-24 ENCOUNTER — Telehealth: Payer: Self-pay | Admitting: Rheumatology

## 2016-10-24 NOTE — Telephone Encounter (Signed)
Patient called wanting to know if we have any samples of [name] for her that she can come pick up, if not she will need a prescription.  CB#[phone].  Thank you.

## 2016-10-24 NOTE — Telephone Encounter (Signed)
Yes; 1 bottle is fine.

## 2016-10-24 NOTE — Telephone Encounter (Signed)
Advised patient she could come and pick up a sample.

## 2016-10-24 NOTE — Telephone Encounter (Signed)
Last Visit: 09/04/16 Next Visit: 01/08/17 Labs: 09/29/16 Stable  Okay to provide patient with samples of Xeljanz XR 11 mg?

## 2016-10-24 NOTE — Addendum Note (Signed)
Addendum  created 10/24/16 1449 by Rica Koyanagi, MD   Sign clinical note

## 2016-10-27 ENCOUNTER — Telehealth: Payer: Self-pay | Admitting: *Deleted

## 2016-10-27 NOTE — Telephone Encounter (Signed)
Medication Samples have been provided to the patient.  Drug name: [name] XR     Strength: 11 mg       Qty: 1 bottle  LOT: [id number]  Exp.Date: 09/2017  Dosing instructions: Take one po daily  The patient has been instructed regarding the correct time, dose, and frequency of taking this medication, including desired effects and most common side effects.   Gwenlyn Perking 1:34 PM 10/27/2016

## 2016-10-28 NOTE — Addendum Note (Signed)
Encounter addended by: Ronnette Hila, RN on: 10/28/2016  3:35 PM<BR>    Actions taken: Delete clinical note

## 2016-10-30 ENCOUNTER — Encounter: Payer: Self-pay | Admitting: Neurology

## 2016-10-30 ENCOUNTER — Ambulatory Visit (INDEPENDENT_AMBULATORY_CARE_PROVIDER_SITE_OTHER): Payer: Medicare Other | Admitting: Neurology

## 2016-10-30 DIAGNOSIS — M0579 Rheumatoid arthritis with rheumatoid factor of multiple sites without organ or systems involvement: Secondary | ICD-10-CM | POA: Diagnosis not present

## 2016-10-30 DIAGNOSIS — R350 Frequency of micturition: Secondary | ICD-10-CM

## 2016-10-30 DIAGNOSIS — G35 Multiple sclerosis: Secondary | ICD-10-CM | POA: Diagnosis not present

## 2016-10-30 DIAGNOSIS — R269 Unspecified abnormalities of gait and mobility: Secondary | ICD-10-CM | POA: Diagnosis not present

## 2016-10-30 DIAGNOSIS — R5382 Chronic fatigue, unspecified: Secondary | ICD-10-CM | POA: Diagnosis not present

## 2016-10-30 DIAGNOSIS — R200 Anesthesia of skin: Secondary | ICD-10-CM

## 2016-10-30 MED ORDER — DIAZEPAM 5 MG PO TABS: 5.0000 mg | ORAL_TABLET | Freq: Three times a day (TID) | ORAL | 2 refills | Status: DC | PRN

## 2016-10-30 NOTE — Progress Notes (Signed)
[facility]  PATIENT: Sherri [name] DOB: [birth year]  REFERRING CLINICIAN: Unice [name]  HISTORY FROM: patient REASON FOR VISIT: Multiple sclerosis   HISTORICAL  CHIEF COMPLAINT:  Chief Complaint  Patient presents with  . Multiple Sclerosis    HISTORY OF PRESENT ILLNESS:  Sherri [name] is a 76 year old woman who was diagnosed with relapsing remitting MS in 2010.     MS:   She feels her MS is stable.    She also has RA and is on Xeljanz and prednisone.    She is not on any DMT for her MS.     She denies any recent exacerbation.     Her right leg sometimes gives out on her but the changes have been gradual.    Vision:    She had recent surgery for ptosis (10/02/16) after she had cataract surgery (still had reduced peripheral vision after initil surgery when she noted the problem more).      RA:   She remains on Xeljanz and methotrexate.   MTX dose reduced as higher does may have affected renal function and dose was reduced.     Gait/strength:    She feels gait is worse and she notes dragging her right foot more, even for short distances.    He also notes more cramps in the right leg.   The right leg also sometimes gives out randomly.  Gait also affected by right hip pain and she also had left THR in the past.      She feels gait is not bad enough to use a cane but does feel off balanced.    The legs (R>L) also ache a lot more.  Tramadol during the day and oxycodone and valium at night help the pain a lot.   Leg cramps are worse when she lays down.    Sensation:   She notes mild foot numbness bilaterally which is stable.     She has stable left facial numbness  in V2 and V3 > V1.   This was her first symptom (tongue was numb many years before diagnosis).    No arm numbness.     Bladder:   She has urinary frequency and hesitancy.   She does not completely empty (has PVR's at urology).    She has no recent UTI's,       Fatigue/sleep:     Fatigue is mild most days  but worse a day after 'overdoing it'.    She is sleeping well with Valium 5 mg and Oxycodone 10 mg at bedtime .   He sleeps better with her pain under better control.    She is active, walking daily.    Mood/cognition:     Mood is doing well.    She does not note that she has any significant cognitive changes.       MS History:   When she was in her late 22s she had an episode of right-sided numbness and she lost the use of her right arm for a couple of weeks. At that time, she was told that she might have MS. Over the next 30 or 40 years she had several more similar but milder episodes. In 2010, she had an MRI of the cervical spine (for arm pain) and it showed foci c/w multiple sclerosis. An MRI of the brain was also performed  shortly thereafter showing white matter lesions consistent with the diagnosis.  Initially, she was placed on Avonex. She also has  rheumatoid arthritis and was placed on methotrexate and then a combination of methotrexate with [name]. We decided to stop the Avonex as she probably would get some benefit from those medications for the MS as well. For the most part her MS has been stable over the past 5 years.However, early 2016,  she had numbness in the left face and weakness in her legs   REVIEW OF SYSTEMS:  Constitutional: No fevers, chills, sweats, or change in appetite Eyes: No visual changes, double vision, eye pain Ear, nose and throat: No hearing loss, ear pain, nasal congestion, sore throat Cardiovascular: No chest pain, palpitations Respiratory:  No shortness of breath at rest or with exertion.   No wheezes GastrointestinaI: No nausea, vomiting, diarrhea, abdominal pain, fecal incontinence Genitourinary:  as above. Musculoskeletal:  No neck pain, back pain Integumentary: No rash, pruritus, skin lesions Neurological: as above Psychiatric: he has had a lot of family stress leading to some depression and more anxiety. Endocrine: No palpitations, diaphoresis, change in  appetite, change in weigh or increased thirst Hematologic/Lymphatic:  No anemia, purpura, petechiae. Allergic/Immunologic: No itchy/runny eyes, nasal congestion, recent allergic reactions, rashes  ALLERGIES: Allergies  Allergen Reactions  . Darvon [Propoxyphene Hcl] Shortness Of Breath    ? Dose Related ? Lowered respirations greatly  . Meperidine Hcl Shortness Of Breath  . Penicillins Hives and Other (See Comments)    Has patient had a PCN reaction causing immediate rash, facial/tongue/throat swelling, SOB or lightheadedness with hypotension: No SEVERE RASH INVOLVING MUCUS MEMBRANES or SKIN NECROSIS: #  #  #  YES  #  #  #  Has patient had a PCN reaction that required hospitalization No Has patient had a PCN reaction occurring within the last 10 years: No.   . Azathioprine Other (See Comments)    Weakness  . Ezetimibe-Simvastatin Nausea And Vomiting    HOME MEDICATIONS: Outpatient Medications Prior to Visit  Medication Sig Dispense Refill  . atorvastatin (LIPITOR) 10 MG tablet Take 1 tablet by mouth on Mondays and Friday 27 tablet 2  . Biotin 5000 MCG TABS Take 5,000 mcg by mouth daily.     . Calcium Carbonate-Vitamin D (CALCIUM + D PO) Take 1 tablet by mouth daily.     . Carboxymethylcellul-Glycerin (LUBRICATING EYE DROPS OP) Apply 1 drop to eye daily as needed (dry eyes).    . Cholecalciferol (VITAMIN D3) 2000 units capsule Take 2,000 Units by mouth daily.    . Cyanocobalamin (VITAMIN B-12 PO) Take 1 tablet by mouth daily.    . diclofenac sodium (VOLTAREN) 1 % GEL APPLY 3 GRAMS TO 3 LARGE JOINTS 3 TIMES A DAY AS NEEDED (Patient taking differently: APPLY 3 GRAMS TO 3 LARGE JOINTS 3 TIMES A DAY AS NEEDED FOR PAIN) 361 g 3  . folic acid (FOLVITE) 1 MG tablet Take 1 mg by mouth daily.     . furosemide (LASIX) 20 MG tablet TAKE 1 TABLET BY MOUTH EVERY DAY 90 tablet 1  . methotrexate (RHEUMATREX) 2.5 MG tablet TAKE 6 TABLETS (15 MG TOTAL) BY MOUTH ONCE A WEEK. CAUTION:CHEMOTHERAPY.  PROTECT FROM LIGHT. 72 tablet 0  . Omega-3 Fatty Acids (FISH OIL) 1000 MG CAPS Take 1,000 mg by mouth daily.    . Oxycodone HCl 10 MG TABS Take 1 tablet (10 mg total) by mouth 3 (three) times daily as needed. 90 tablet 0  . predniSONE (DELTASONE) 1 MG tablet TAKE 4 TABLETS (4 MG TOTAL) BY MOUTH DAILY WITH BREAKFAST. 360 tablet 0  .  Pyridoxine HCl (VITAMIN B-6 PO) Take 1 tablet by mouth daily.    . ramipril (ALTACE) 5 MG capsule Take 1 capsule (5 mg total) by mouth daily with breakfast. 90 capsule 2  . tiZANidine (ZANAFLEX) 4 MG capsule Take 4 mg by mouth 3 (three) times daily as needed for muscle spasms.    . Tofacitinib Citrate (XELJANZ XR) 11 MG TB24 Take 11 mg by mouth daily. 60 tablet 0  . traMADol (ULTRAM) 50 MG tablet Take 1 tablet (50 mg total) by mouth 4 (four) times daily. 120 tablet 3  . diazepam (VALIUM) 5 MG tablet Take 1 tablet (5 mg total) by mouth every 8 (eight) hours as needed for anxiety. (Patient taking differently: Take 5 mg by mouth at bedtime. ) 90 tablet 0   No facility-administered medications prior to visit.     PAST MEDICAL HISTORY: Past Medical History:  Diagnosis Date  . Anxiety    takes Valium daily as needed  . Bruises easily    d/t meds  . Cancer ([place])    basal cell ca, in situ- uterine   . Cataracts, bilateral   . Chronic back pain   . Dizziness    r/t to meds  . GERD (gastroesophageal reflux disease)    no meds on a regular basis but will take Tums if needed  . Headache(784.0)    r/t neck issues  . History of bronchitis 6-59yrs ago  . History of colon polyps    benign  . Hyperlipidemia    takes Atorvastatin on Mondays and Fridays  . Hypertension    takes Ramipril daily  . Joint pain   . Joint swelling   . Multiple sclerosis ([place])   . Neuromuscular disorder ([place])    Dr. Park Liter- [facility], follows M.S.  . Nocturia   . OA (osteoarthritis)    Dr Jani Gravel weekly, RA- hands- knees- feet   . PONV (postoperative nausea and  vomiting)    trouble urinating after surgery in 2014  . Rheumatoid arthritis(714.0)    Dr Estanislado Pandy takes [name] daily  . Skin cancer   . Urinary retention    sees Dr.Wrenn about 2 times a yr    PAST SURGICAL HISTORY: Past Surgical History:  Procedure Laterality Date  . ABDOMINAL HYSTERECTOMY     1985  . APPENDECTOMY     with TAH  . BACK SURGERY     several  . BROW LIFT Bilateral 10/02/2016   Procedure: BILATERAL LOWER LID BLEPHAROPLASTY;  Surgeon: Clista Bernhardt, MD;  Location: [facility];  Service: Plastics;  Laterality: Bilateral;  . CARPAL TUNNEL RELEASE Right   . CERVICAL FUSION     Dr Joya Salm  . CERVICAL FUSION  12/31/2012   Dr Joya Salm  . CHEST TUBE INSERTION     for traumatic Pneumothorax  . COLONOSCOPY  07/16/2010   normal   . COLONOSCOPY W/ POLYPECTOMY  1997   negative since; Dr Deatra Ina  . ESOPHAGOGASTRODUODENOSCOPY  07/16/2010   normal  . EYE SURGERY Bilateral    cataracts removed - /w IOL  . LUMBAR FUSION     Dr Joya Salm  . NASAL SINUS SURGERY    . POSTERIOR CERVICAL FUSION/FORAMINOTOMY N/A 12/31/2012   Procedure: POSTERIOR LATERAL CERVICAL FUSION/FORAMINOTOMY LEVEL 1 CERVICAL THREE-FOUR WITH LATERAL MASS SCREWS;  Surgeon: Floyce Stakes, MD;  Location: [facility];  Service: Neurosurgery;  Laterality: N/A;  . PTOSIS REPAIR Bilateral 10/02/2016   Procedure: INTERNAL PTOSIS REPAIR;  Surgeon: Clista Bernhardt, MD;  Location: [facility];  Service: Plastics;  Laterality: Bilateral;  . SKIN BIOPSY    . THYROID SURGERY     R lobe removed- 1972, has grown back - CT last done- 2018  . TOTAL HIP ARTHROPLASTY  12/22/2011   Procedure: TOTAL HIP ARTHROPLASTY;  Surgeon: Kerin Salen, MD;  Location: [facility];  Service: Orthopedics;  Laterality: Left;  . TUBAL LIGATION      FAMILY HISTORY: Family History  Problem Relation Age of Onset  . Cancer Mother        pancreatic  . Diabetes Mother   . Heart disease Father        Rheumatic  . Cancer Father        ? stomach  . Cancer  Sister        stomach  . Cancer Maternal Aunt        X 4; ? primary  . Heart disease Paternal Aunt   . Cancer Maternal Grandmother        cervical  . Colon cancer Unknown        Aunts  . Multiple sclerosis Daughter   . Hypertension Neg Hx   . Stroke Neg Hx     SOCIAL HISTORY:  Social History   Social History  . Marital status: Married    Spouse name: N/A  . Number of children: N/A  . Years of education: N/A   Occupational History  . retired    Social History Main Topics  . Smoking status: Former Smoker    Quit date: 05/26/1981  . Smokeless tobacco: Never Used     Comment: Smoked [phone], up to 2.5 ppd  . Alcohol use 0.0 oz/week     Comment: 2-3 beers every day 2%  . Drug use: No  . Sexual activity: Yes   Other Topics Concern  . Not on file   Social History Narrative  . No narrative on file     PHYSICAL EXAM  There were no vitals filed for this visit.  There is no height or weight on file to calculate BMI.   General: The patient is well-developed and well-nourished and in no acute distress  Musculoskeletal:   Right biceps bulge from ruptured tendon.    Her neck is nontender. Shoulder has good range of motion Skin: Extremities are without rash or edema.  Neurologic Exam  Mental status: The patient is alert and oriented x 3 at the time of the examination. The patient has apparent normal recent and remote memory, with an apparently normal attention span and concentration ability.   Speech is normal.  CN:  EOMI, reduced facial sensation to soft touch left V2 (now V3 is ok) .Facial strength is normal.  Palatal elevation is normal and Tongue protrusion midline. Trapezius and sternocleidomastoid strength is normal. No dysarthria is noted.   No obvious hearing deficits are noted.  Motor:  Right biceps bulges (she has a history of attendant care). She has mild atrophy of the right FDIO muscle    Tone is mildly increased in her legs, left > right. Strength appears  5 / 5 in all 4 extremities except 4/5 ulnar innervated intrinsic hand muscles on the right.     Sensory: Sensory testing is intact to touch and vibration in the arms and legs.  Coordination: Cerebellar testing reveals good rapid alternating movements in hands   Gait and station: Station is stable. Gait was mildly wide and arthritic. There is a mild right foot drop.   Her  tandem is wide.      Romberg is negative.   Reflexes: Deep tendon reflexes are symmetric and normal in legs.      DIAGNOSTIC DATA (LABS, IMAGING, TESTING) - I reviewed patient records, labs, notes, testing and imaging myself where available.  Lab Results  Component Value Date   WBC 7.6 09/30/2016   HGB 13.9 09/30/2016   HCT 41.7 09/30/2016   MCV 103.0 (H) 09/30/2016   PLT 272 09/30/2016      Component Value Date/Time   NA 138 09/30/2016 1207   NA 141 07/01/2016 1516   NA 136 02/22/2015   K 4.4 09/30/2016 1207   K 4.4 02/22/2015   CL 100 (L) 09/30/2016 1207   CL 98 02/22/2015   CO2 28 09/30/2016 1207   CO2 30 02/22/2015   GLUCOSE 106 (H) 09/30/2016 1207   BUN 8 09/30/2016 1207   BUN 9 07/01/2016 1516   CREATININE 0.94 09/30/2016 1207   CREATININE 0.84 09/29/2016 1140   CALCIUM 9.7 09/30/2016 1207   CALCIUM 9.6 02/22/2015   PROT 6.4 09/29/2016 1140   PROT 6.7 07/01/2016 1516   PROT 6.6 02/22/2015   ALBUMIN 4.0 09/29/2016 1140   ALBUMIN 4.5 07/01/2016 1516   ALBUMIN 4.3 02/22/2015   AST 28 09/29/2016 1140   AST 24 02/22/2015   ALT 23 09/29/2016 1140   ALT 18 02/22/2015   ALKPHOS 44 09/29/2016 1140   ALKPHOS 34 02/22/2015   BILITOT 0.5 09/29/2016 1140   BILITOT 0.3 07/01/2016 1516   GFRNONAA 58 (L) 09/30/2016 1207   GFRNONAA 69 09/29/2016 1140   GFRAA >60 09/30/2016 1207   GFRAA 79 09/29/2016 1140   Lab Results  Component Value Date   CHOL 205 (H) 04/14/2016   HDL 82.90 04/14/2016   LDLCALC 98 04/14/2016   LDLDIRECT 122.9 11/15/2012   TRIG 116.0 04/14/2016   CHOLHDL 2 04/14/2016   Lab  Results  Component Value Date   HGBA1C 5.6 10/24/2013   Lab Results  Component Value Date   VITAMINB12 961 (H) 07/22/2016   Lab Results  Component Value Date   TSH 1.55 07/22/2016        ASSESSMENT AND PLAN  Multiple sclerosis (HCC)  Chronic fatigue  Gait disturbance  Rheumatoid arthritis involving multiple sites with positive rheumatoid factor (HCC)  Urinary frequency  Numbness   1.   She will remain off of a disease modifying therapy for MS but continue XelJanz, methotrexate and prednisone for her rheumatoid arthritis per Dr. Estanislado Pandy.   If she has another relapse or if she does not do well with her RA, rituximab could be a good choice with efficacy for both diseases. 2.    She will continue the oxycodone and Valium at night to help her pain, spasticity and sleep. Continue the oxycodone and valium at night to help her pain and sleep 3.    return to see [place] in 4-5 months or sooner if she has new or worsening neurologic symptoms.    Ottis Sarnowski A. Felecia Shelling, MD, PhD 0/10/2692, 8:54 PM Certified in Neurology, Clinical Neurophysiology, Sleep Medicine, Pain Medicine and Neuroimaging  [facility] [address] [place], [place] [address] [phone]

## 2016-11-22 ENCOUNTER — Other Ambulatory Visit: Payer: Self-pay | Admitting: Rheumatology

## 2016-11-24 NOTE — Telephone Encounter (Signed)
Last Visit: 09/04/16 Next Visit: 01/08/17  Okay to refill per Dr. Estanislado Pandy

## 2016-12-02 DIAGNOSIS — D0472 Carcinoma in situ of skin of left lower limb, including hip: Secondary | ICD-10-CM | POA: Diagnosis not present

## 2016-12-02 DIAGNOSIS — L821 Other seborrheic keratosis: Secondary | ICD-10-CM | POA: Diagnosis not present

## 2016-12-02 DIAGNOSIS — L570 Actinic keratosis: Secondary | ICD-10-CM | POA: Diagnosis not present

## 2016-12-02 DIAGNOSIS — D492 Neoplasm of unspecified behavior of bone, soft tissue, and skin: Secondary | ICD-10-CM | POA: Diagnosis not present

## 2016-12-12 DIAGNOSIS — H02403 Unspecified ptosis of bilateral eyelids: Secondary | ICD-10-CM | POA: Diagnosis not present

## 2016-12-16 ENCOUNTER — Ambulatory Visit (INDEPENDENT_AMBULATORY_CARE_PROVIDER_SITE_OTHER): Payer: Medicare Other | Admitting: *Deleted

## 2016-12-16 VITALS — BP 114/60 | HR 68 | Ht 63.0 in | Wt 114.0 lb

## 2016-12-16 DIAGNOSIS — Z0000 Encounter for general adult medical examination without abnormal findings: Secondary | ICD-10-CM

## 2016-12-16 NOTE — Patient Instructions (Addendum)
Continue doing brain stimulating activities (puzzles, reading, adult coloring books, staying active) to keep memory sharp.   Continue to eat heart healthy diet (full of fruits, vegetables, whole grains, lean protein, water--limit salt, fat, and sugar intake) and increase physical activity as tolerated.   [name] , Thank you for taking time to come for your Medicare Wellness Visit. I appreciate your ongoing commitment to your health goals. Please review the following plan we discussed and let me know if I can assist you in the future.   These are the goals we discussed: Goals    . golf or dancing          Relaxation and playing golf when able as well as dancing     . I want to  play golf, do suspended yoga, and line dance       This is a list of the screening recommended for you and due dates:  Health Maintenance  Topic Date Due  . Mammogram  10/18/2016  . Flu Shot  12/24/2016  . Tetanus Vaccine  05/28/2020  . Colon Cancer Screening  07/16/2020  . DEXA scan (bone density measurement)  Completed  . Pneumonia vaccines  Completed

## 2016-12-16 NOTE — Progress Notes (Signed)
Pre visit review using our clinic review tool, if applicable. No additional management support is needed unless otherwise documented below in the visit note. 

## 2016-12-16 NOTE — Progress Notes (Addendum)
Subjective:   Sherri [name] is a 76 y.o. female who presents for Medicare Annual (Subsequent) preventive examination.  Review of Systems:  No ROS.  Medicare Wellness Visit. Additional risk factors are reflected in the social history.  Cardiac Risk Factors include: advanced age (>71men, >73 women);dyslipidemia;sedentary lifestyle Sleep patterns: feels rested on waking, gets up 1 times nightly to void and sleeps 6-7 hours nightly.    Home Safety/Smoke Alarms: Feels safe in home. Smoke alarms in place.  Living environment; residence and Firearm Safety: [place], can live on one level, no firearms. Lives with husband, no needs for DME, good support system  Seat Belt Safety/Bike Helmet: Wears seat belt.   Counseling:   Eye Exam- appointment yearly Dental- appointment every 6 months   Female:   Pap- N/A       Mammo- Last 09/23/16, BI-RADS category 1: negative,  GYN  Dr. Matthew Saras    Dexa scan- Last 10/18/15       CCS- Last 07/16/10, recall 10 years     Objective:     Vitals: BP 114/60 Comment: Left arm  Pulse 68   Ht 5\' 3"  (1.6 m)   Wt 114 lb (51.7 kg)   SpO2 100% Comment: r/a  BMI 20.19 kg/m   Body mass index is 20.19 kg/m.   Tobacco History  Smoking Status  . Former Smoker  . Quit date: 05/26/1981  Smokeless Tobacco  . Never Used    Comment: Smoked [phone], up to 2.5 ppd     Counseling given: Not Answered   Past Medical History:  Diagnosis Date  . Anxiety    takes Valium daily as needed  . Bruises easily    d/t meds  . Cancer ([place])    basal cell ca, in situ- uterine   . Cataracts, bilateral   . Chronic back pain   . Dizziness    r/t to meds  . GERD (gastroesophageal reflux disease)    no meds on a regular basis but will take Tums if needed  . Headache(784.0)    r/t neck issues  . History of bronchitis 6-70yrs ago  . History of colon polyps    benign  . Hyperlipidemia    takes Atorvastatin on Mondays and Fridays  . Hypertension    takes  Ramipril daily  . Joint pain   . Joint swelling   . Multiple sclerosis ([place])   . Neuromuscular disorder ([place])    Dr. Park Liter- [facility], follows M.S.  . Nocturia   . OA (osteoarthritis)    Dr Jani Gravel weekly, RA- hands- knees- feet   . PONV (postoperative nausea and vomiting)    trouble urinating after surgery in 2014  . Rheumatoid arthritis(714.0)    Dr Estanislado Pandy takes [name] daily  . Skin cancer   . Urinary retention    sees Dr.Wrenn about 2 times a yr   Past Surgical History:  Procedure Laterality Date  . ABDOMINAL HYSTERECTOMY     1985  . APPENDECTOMY     with TAH  . BACK SURGERY     several  . BROW LIFT Bilateral 10/02/2016   Procedure: BILATERAL LOWER LID BLEPHAROPLASTY;  Surgeon: Clista Bernhardt, MD;  Location: [facility];  Service: Plastics;  Laterality: Bilateral;  . CARPAL TUNNEL RELEASE Right   . cataracts    . CERVICAL FUSION     Dr Joya Salm  . CERVICAL FUSION  12/31/2012   Dr Joya Salm  . CHEST TUBE INSERTION  for traumatic Pneumothorax  . COLONOSCOPY  07/16/2010   normal   . COLONOSCOPY W/ POLYPECTOMY  1997   negative since; Dr Deatra Ina  . ESOPHAGOGASTRODUODENOSCOPY  07/16/2010   normal  . eye lid raise    . EYE SURGERY Bilateral    cataracts removed - /w IOL  . LUMBAR FUSION     Dr Joya Salm  . NASAL SINUS SURGERY    . POSTERIOR CERVICAL FUSION/FORAMINOTOMY N/A 12/31/2012   Procedure: POSTERIOR LATERAL CERVICAL FUSION/FORAMINOTOMY LEVEL 1 CERVICAL THREE-FOUR WITH LATERAL MASS SCREWS;  Surgeon: Floyce Stakes, MD;  Location: [facility];  Service: Neurosurgery;  Laterality: N/A;  . PTOSIS REPAIR Bilateral 10/02/2016   Procedure: INTERNAL PTOSIS REPAIR;  Surgeon: Clista Bernhardt, MD;  Location: [facility];  Service: Plastics;  Laterality: Bilateral;  . SKIN BIOPSY    . THYROID SURGERY     R lobe removed- 1972, has grown back - CT last done- 2018  . TOTAL HIP ARTHROPLASTY  12/22/2011   Procedure: TOTAL HIP ARTHROPLASTY;  Surgeon: Kerin Salen,  MD;  Location: [facility];  Service: Orthopedics;  Laterality: Left;  . TUBAL LIGATION     Family History  Problem Relation Age of Onset  . Cancer Mother        pancreatic  . Diabetes Mother   . Heart disease Father        Rheumatic  . Cancer Father        ? stomach  . Cancer Sister        stomach  . Cancer Maternal Aunt        X 4; ? primary  . Heart disease Paternal Aunt   . Cancer Maternal Grandmother        cervical  . Colon cancer Unknown        Aunts  . Multiple sclerosis Daughter   . Hypertension Neg Hx   . Stroke Neg Hx    History  Sexual Activity  . Sexual activity: Yes    Outpatient Encounter Prescriptions as of 12/16/2016  Medication Sig  . atorvastatin (LIPITOR) 10 MG tablet Take 1 tablet by mouth on Mondays and Friday  . Biotin 5000 MCG TABS Take 5,000 mcg by mouth daily.   . Calcium Carbonate-Vitamin D (CALCIUM + D PO) Take 1 tablet by mouth daily.   . Carboxymethylcellul-Glycerin (LUBRICATING EYE DROPS OP) Apply 1 drop to eye daily as needed (dry eyes).  . Cholecalciferol (VITAMIN D3) 2000 units capsule Take 2,000 Units by mouth daily.  . Cyanocobalamin (VITAMIN B-12 PO) Take 1 tablet by mouth daily.  . diazepam (VALIUM) 5 MG tablet Take 1 tablet (5 mg total) by mouth every 8 (eight) hours as needed.  . diclofenac sodium (VOLTAREN) 1 % GEL APPLY 3 GRAMS TO 3 LARGE JOINTS 3 TIMES A DAY AS NEEDED  . folic acid (FOLVITE) 1 MG tablet Take 1 mg by mouth daily.   . furosemide (LASIX) 20 MG tablet TAKE 1 TABLET BY MOUTH EVERY DAY  . methotrexate (RHEUMATREX) 2.5 MG tablet TAKE 6 TABLETS (15 MG TOTAL) BY MOUTH ONCE A WEEK. CAUTION:CHEMOTHERAPY. PROTECT FROM LIGHT.  [name] Oxycodone HCl 10 MG TABS Take 1 tablet (10 mg total) by mouth 3 (three) times daily as needed.  . predniSONE (DELTASONE) 1 MG tablet TAKE 4 TABLETS (4 MG TOTAL) BY MOUTH DAILY WITH BREAKFAST.  [name] Pyridoxine HCl (VITAMIN B-6 PO) Take 1 tablet by mouth daily.  . ramipril (ALTACE) 5 MG capsule Take 1 capsule (5  mg  total) by mouth daily with breakfast.  . tiZANidine (ZANAFLEX) 4 MG capsule Take 4 mg by mouth 3 (three) times daily as needed for muscle spasms.  . Tofacitinib Citrate (XELJANZ XR) 11 MG TB24 Take 11 mg by mouth daily.  . traMADol (ULTRAM) 50 MG tablet Take 1 tablet (50 mg total) by mouth 4 (four) times daily.  . [DISCONTINUED] Omega-3 Fatty Acids (FISH OIL) 1000 MG CAPS Take 1,000 mg by mouth daily.   No facility-administered encounter medications on file as of 12/16/2016.     Activities of Daily Living In your present state of health, do you have any difficulty performing the following activities: 12/16/2016 09/30/2016  Hearing? N N  Vision? N N  Difficulty concentrating or making decisions? N N  Walking or climbing stairs? N N  Dressing or bathing? N N  Doing errands, shopping? N -  Preparing Food and eating ? N -  Using the Toilet? N -  In the past six months, have you accidently leaked urine? N -  Do you have problems with loss of bowel control? N -  Managing your Medications? N -  Managing your Finances? N -  Housekeeping or managing your Housekeeping? N -  Some recent data might be hidden    Patient Care Team: Binnie Rail, MD as PCP - General (Internal Medicine) Felecia Shelling, Nanine Means, MD (Neurology) Bo Merino, MD as Consulting Physician (Rheumatology)    Assessment:    Physical assessment deferred to PCP.  Exercise Activities and Dietary recommendations Current Exercise Habits: The patient does not participate in regular exercise at present, Exercise limited by: Other - see comments;orthopedic condition(s) (MS, RA,)  Diet (meal preparation, eat out, water intake, caffeinated beverages, dairy products, fruits and vegetables): in general, a "healthy" diet  , well balanced, low fat/ cholesterol, low salt eats a variety of fruits and vegetables daily, limits salt, fat/cholesterol, sugar, caffeine, drinks 1-2 glasses of water daily.  encouraged patient to increase  daily water intake.    Goals    . golf or dancing          Relaxation and playing golf when able as well as dancing     . I want to  play golf, do suspended yoga, and line dance      Fall Risk Fall Risk  12/16/2016 10/21/2016 05/07/2015 09/08/2014 11/15/2012  Falls in the past year? No No No No No  Risk for fall due to : Impaired balance/gait - - - -   Depression Screen PHQ 2/9 Scores 12/16/2016 10/21/2016 10/21/2016 05/07/2015  PHQ - 2 Score 0 0 0 0  PHQ- 9 Score 1 1 - -     Cognitive Function       Ad8 score reviewed for issues:  Issues making decisions: no  Less interest in hobbies / activities: no  Repeats questions, stories (family complaining): no  Trouble using ordinary gadgets (microwave, computer, phone):no  Forgets the month or year: no  Mismanaging finances: no  Remembering appts: no  Daily problems with thinking and/or memory: no Ad8 score is= 0  Immunization History  Administered Date(s) Administered  . Influenza Whole 02/22/2007, 02/08/2013  . Influenza, High Dose Seasonal PF 02/04/2015  . Influenza-Unspecified 01/24/2014, 01/25/2016  . Pneumococcal Conjugate-13 11/23/2012, 09/08/2014  . Pneumococcal Polysaccharide-23 05/26/2006  . Pneumococcal-Unspecified 12/28/2012  . Td 05/28/2010  . Zoster 05/26/2009   Screening Tests Health Maintenance  Topic Date Due  . MAMMOGRAM  10/18/2016  . INFLUENZA VACCINE  12/24/2016  . TETANUS/TDAP  05/28/2020  . COLONOSCOPY  07/16/2020  . DEXA SCAN  Completed  . PNA vac Low Risk Adult  Completed      Plan:    Continue doing brain stimulating activities (puzzles, reading, adult coloring books, staying active) to keep memory sharp.   Continue to eat heart healthy diet (full of fruits, vegetables, whole grains, lean protein, water--limit salt, fat, and sugar intake) and increase physical activity as tolerated.  I have personally reviewed and noted the following in the patient's chart:   . Medical and  social history . Use of alcohol, tobacco or illicit drugs  . Current medications and supplements . Functional ability and status . Nutritional status . Physical activity . Advanced directives . List of other physicians . Vitals . Screenings to include cognitive, depression, and falls . Referrals and appointments  In addition, I have reviewed and discussed with patient certain preventive protocols, quality metrics, and best practice recommendations. A written personalized care plan for preventive services as well as general preventive health recommendations were provided to patient.     Michiel Cowboy, RN  12/16/2016  Medical screening examination/treatment/procedure(s) were performed by non-physician practitioner and as supervising physician I was immediately available for consultation/collaboration. I agree with above. [name] Calico, MD

## 2016-12-19 ENCOUNTER — Ambulatory Visit (INDEPENDENT_AMBULATORY_CARE_PROVIDER_SITE_OTHER): Payer: Medicare Other | Admitting: Rheumatology

## 2016-12-19 ENCOUNTER — Encounter: Payer: Self-pay | Admitting: Rheumatology

## 2016-12-19 VITALS — BP 106/64 | HR 73 | Resp 14 | Ht 63.0 in | Wt 116.0 lb

## 2016-12-19 DIAGNOSIS — M79641 Pain in right hand: Secondary | ICD-10-CM

## 2016-12-19 DIAGNOSIS — M79642 Pain in left hand: Secondary | ICD-10-CM

## 2016-12-19 DIAGNOSIS — M0579 Rheumatoid arthritis with rheumatoid factor of multiple sites without organ or systems involvement: Secondary | ICD-10-CM | POA: Diagnosis not present

## 2016-12-19 DIAGNOSIS — Z79899 Other long term (current) drug therapy: Secondary | ICD-10-CM

## 2016-12-19 DIAGNOSIS — G35 Multiple sclerosis: Secondary | ICD-10-CM

## 2016-12-19 DIAGNOSIS — M25531 Pain in right wrist: Secondary | ICD-10-CM

## 2016-12-19 DIAGNOSIS — M25521 Pain in right elbow: Secondary | ICD-10-CM

## 2016-12-19 DIAGNOSIS — M25522 Pain in left elbow: Secondary | ICD-10-CM

## 2016-12-19 DIAGNOSIS — M25532 Pain in left wrist: Secondary | ICD-10-CM | POA: Diagnosis not present

## 2016-12-19 LAB — CBC WITH DIFFERENTIAL/PLATELET
BASOS ABS: 0 {cells}/uL (ref 0–200)
BASOS PCT: 0 %
EOS PCT: 0 %
Eosinophils Absolute: 0 cells/uL — ABNORMAL LOW (ref 15–500)
HEMATOCRIT: 39.7 % (ref 35.0–45.0)
HEMOGLOBIN: 13.3 g/dL (ref 11.7–15.5)
LYMPHS ABS: 1275 {cells}/uL (ref 850–3900)
LYMPHS PCT: 25 %
MCH: 33.7 pg — ABNORMAL HIGH (ref 27.0–33.0)
MCHC: 33.5 g/dL (ref 32.0–36.0)
MCV: 100.5 fL — ABNORMAL HIGH (ref 80.0–100.0)
MPV: 9.4 fL (ref 7.5–12.5)
Monocytes Absolute: 408 cells/uL (ref 200–950)
Monocytes Relative: 8 %
NEUTROS PCT: 67 %
Neutro Abs: 3417 cells/uL (ref 1500–7800)
Platelets: 326 10*3/uL (ref 140–400)
RBC: 3.95 MIL/uL (ref 3.80–5.10)
RDW: 14.6 % (ref 11.0–15.0)
WBC: 5.1 10*3/uL (ref 3.8–10.8)

## 2016-12-19 MED ORDER — METHOTREXATE 2.5 MG PO TABS: 10.0000 mg | ORAL_TABLET | ORAL | 0 refills | Status: DC

## 2016-12-19 MED ORDER — PREDNISONE 1 MG PO TABS: 4.0000 mg | ORAL_TABLET | Freq: Every day | ORAL | 1 refills | Status: DC

## 2016-12-19 MED ORDER — TOFACITINIB CITRATE ER 11 MG PO TB24: 11.0000 mg | ORAL_TABLET | Freq: Every day | ORAL | 0 refills | Status: DC

## 2016-12-19 NOTE — Progress Notes (Signed)
Office Visit Note  Patient: Sherri [name]             Date of Birth: 1941/03/12           MRN: [id number]             PCP: Binnie Rail, MD Referring: Binnie Rail, MD Visit Date: 12/19/2016 Occupation: @GUAROCC @    Subjective:  No chief complaint on file. hand  , wrist, elbow joint pain  History of Present Illness: Sherri [name] is a 76 y.o. female  Last seen 09/04/16 for rheumatoid arthritis and high-risk prescription (Xeljanz 11 mg daily, methotrexate 4 pills weekly, folic acid 2 mg daily, prednisone 4 mg daily --long taper.  Patient had methotrexate decreased from 6 pills a week to 4 pills a week due to her abnormal kidney function on recent labs. She continues takes [place] 11 mg daily with good results. She also needs prednisone 4 mg daily and folic acid 2 mg daily.  Due to the history of MS, she has a little options available.  Patient states that over the last 3 years, she's had 25 skin lesions removed from her body. She is wondering whether his [name] is responsible for this. She would like to have a referral for evaluation and discussion of any other treatment options that might be possible for her.   Activities of Daily Living:  Patient reports morning stiffness for 30 minutes.   Patient Reports nocturnal pain.  Difficulty dressing/grooming: Reports Difficulty climbing stairs: Denies Difficulty getting out of chair: Denies Difficulty using hands for taps, buttons, cutlery, and/or writing: Reports     Review of Systems  Constitutional: Negative for fatigue.  HENT: Negative for mouth sores and mouth dryness.   Eyes: Negative for dryness.  Respiratory: Negative for shortness of breath.   Gastrointestinal: Negative for constipation and diarrhea.  Musculoskeletal: Negative for myalgias and myalgias.  Skin: Negative for sensitivity to sunlight.  Psychiatric/Behavioral: Negative for decreased concentration and sleep disturbance.    PMFS History:    Patient Active Problem List   Diagnosis Date Noted  . High risk medication use 09/02/2016  . Urinary frequency 09/02/2016  . Piriformis muscle pain 09/02/2016  . Fatigue 07/22/2016  . Hair loss 07/22/2016  . Right hip pain 04/08/2016  . Right knee pain 02/25/2016  . Dyspepsia 04/05/2015  . Numbness 11/14/2014  . Biceps tendon tear 10/10/2014  . Insomnia 10/10/2014  . Gait disturbance 06/13/2014  . 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 OP (osteoporosis) 01/18/2014  . Other long term (current) drug therapy 01/18/2014  . Other dysphagia 12/29/2013  . Hoarseness 12/29/2013  . 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 Personal history of colonic polyps 07/03/2010  . Nocturia 05/28/2010  . THYROID NODULE 05/17/2008  . Multiple sclerosis ([place]) 05/17/2008  . Chronic fatigue 03/30/2007  . Hyperlipidemia 08/20/2006  . Essential hypertension 08/20/2006  . Rheumatoid arthritis ([place]) 08/20/2006  . CERVICAL CANCER, HX OF 08/20/2006  . GASTRIC ULCER, HX OF 08/20/2006    Past Medical History:  Diagnosis Date  . Anxiety    takes Valium daily as needed  . Bruises easily    d/t meds  . Cancer ([place])    basal cell ca, in situ- uterine   . Cataracts, bilateral   . Chronic back pain   . Dizziness    r/t to meds  . GERD (gastroesophageal reflux disease)    no meds on a regular basis  but will take Tums if needed  . Headache(784.0)    r/t neck issues  . History of bronchitis 6-65yr ago  . History of colon polyps    benign  . Hyperlipidemia    takes Atorvastatin on Mondays and Fridays  . Hypertension    takes Ramipril daily  . Joint pain   . Joint swelling   . [place]   . Neuromuscular disorder ([place]    Dr. RPark Liter [facility], follows [facility]  . Nocturia   . OA (osteoarthritis)    Dr VJani Gravelweekly, RA- hands- knees- feet   . PONV (postoperative nausea and vomiting)    trouble  urinating after surgery in 2014  . Rheumatoid arthritis(714.0)    Dr DEstanislado Pandytakes XMorrie Sheldondaily  . Skin cancer   . Urinary retention    sees Dr.Wrenn about 2 times a yr    Family History  Problem Relation Age of Onset  . Cancer Mother        pancreatic  . Diabetes Mother   . Heart disease Father        Rheumatic  . Cancer Father        ? stomach  . Cancer Sister        stomach  . Cancer Maternal Aunt        X 4; ? primary  . Heart disease Paternal Aunt   . Cancer Maternal Grandmother        cervical  . Colon cancer Unknown        Aunts  . Multiple sclerosis Daughter   . Hypertension Neg Hx   . Stroke Neg Hx    Past Surgical History:  Procedure Laterality Date  . ABDOMINAL HYSTERECTOMY     1985  . APPENDECTOMY     with TAH  . BACK SURGERY     several  . BROW LIFT Bilateral 10/02/2016   Procedure: BILATERAL LOWER LID BLEPHAROPLASTY;  Surgeon: AClista Bernhardt MD;  [facility]  Service: Plastics;  Laterality: Bilateral;  . CARPAL TUNNEL RELEASE Right   . cataracts    . CERVICAL FUSION     Dr BJoya Salm . CERVICAL FUSION  12/31/2012   Dr BJoya Salm . CHEST TUBE INSERTION     for traumatic Pneumothorax  . COLONOSCOPY  07/16/2010   normal   . COLONOSCOPY W/ POLYPECTOMY  1997   negative since; Dr KDeatra Ina . ESOPHAGOGASTRODUODENOSCOPY  07/16/2010   normal  . eye lid raise    . EYE SURGERY Bilateral    cataracts removed - /w IOL  . LUMBAR FUSION     Dr BJoya Salm . NASAL SINUS SURGERY    . POSTERIOR CERVICAL FUSION/FORAMINOTOMY N/A 12/31/2012   Procedure: POSTERIOR LATERAL CERVICAL FUSION/FORAMINOTOMY LEVEL 1 CERVICAL THREE-FOUR WITH LATERAL MASS SCREWS;  Surgeon: EFloyce Stakes MD;  [facility];  Service: Neurosurgery;  Laterality: N/A;  . PTOSIS REPAIR Bilateral 10/02/2016   Procedure: INTERNAL PTOSIS REPAIR;  Surgeon: AClista Bernhardt MD;  [facility]  Service: Plastics;  Laterality: Bilateral;  . SKIN BIOPSY    . THYROID SURGERY     R lobe  removed- 1972, has grown back - CT last done- 2018  . TOTAL HIP ARTHROPLASTY  12/22/2011   Procedure: TOTAL HIP ARTHROPLASTY;  Surgeon: FKerin Salen MD;  [facility]  Service: Orthopedics;  Laterality: Left;  . TUBAL LIGATION     Social History   Social History Narrative  .  No narrative on file     Objective: Vital Signs: BP 106/64   Pulse 73   Resp 14   Ht 5' 3"  (1.6 m)   Wt 116 lb (52.6 kg)   BMI 20.55 kg/m    Physical Exam  Constitutional: She is oriented to person, place, and time. She appears well-developed and well-nourished.  HENT:  Head: Normocephalic and atraumatic.  Eyes: Pupils are equal, round, and reactive to light. EOM are normal.  Cardiovascular: Normal rate, regular rhythm and normal heart sounds.  Exam reveals no gallop and no friction rub.   No murmur heard. Pulmonary/Chest: Effort normal and breath sounds normal. She has no wheezes. She has no rales.  Abdominal: Soft. Bowel sounds are normal. She exhibits no distension. There is no tenderness. There is no guarding. No hernia.  Musculoskeletal: Normal range of motion. She exhibits no edema, tenderness or deformity.  Lymphadenopathy:    She has no cervical adenopathy.  Neurological: She is alert and oriented to person, place, and time. Coordination normal.  Skin: Skin is warm and dry. Capillary refill takes less than 2 seconds. No rash noted.  Psychiatric: She has a normal mood and affect. Her behavior is normal.  Nursing note and vitals reviewed.    Musculoskeletal Exam:  FROM of all joints. Grip strength is equal and strong bilaterally Fiber myalgia tender points are absent   CDAI Exam: CDAI Homunculus Exam:   Tenderness:  RUE: ulnohumeral and radiohumeral and wrist LUE: ulnohumeral and radiohumeral and wrist Right hand: 1st MCP, 2nd MCP and 3rd MCP Left hand: 1st MCP, 2nd MCP and 3rd MCP  Swelling:  LUE: wrist Right hand: 1st MCP, 2nd MCP and 3rd MCP  Joint Counts:  CDAI Tender  Joint count: 10 CDAI Swollen Joint count: 4  Global Assessments:  Patient Global Assessment: 9 Provider Global Assessment: 9  CDAI Calculated Score: 32   Investigation: No additional findings. Hospital Outpatient Visit on 09/30/2016  Component Date Value Ref Range Status  . Sodium 09/30/2016 138  135 - 145 mmol/L Final  . Potassium 09/30/2016 4.4  3.5 - 5.1 mmol/L Final  . Chloride 09/30/2016 100* 101 - 111 mmol/L Final  . CO2 09/30/2016 28  22 - 32 mmol/L Final  . Glucose, Bld 09/30/2016 106* 65 - 99 mg/dL Final  . BUN 09/30/2016 8  6 - 20 mg/dL Final  . Creatinine, Ser 09/30/2016 0.94  0.44 - 1.00 mg/dL Final  . Calcium 09/30/2016 9.7  8.9 - 10.3 mg/dL Final  . GFR calc non Af Amer 09/30/2016 58* >60 mL/min Final  . GFR calc Af Amer 09/30/2016 >60  >60 mL/min Final   Comment: (NOTE) The eGFR has been calculated using the CKD EPI equation. This calculation has not been validated in all clinical situations. eGFR's persistently <60 mL/min signify possible Chronic Kidney Disease.   . Anion gap 09/30/2016 10  5 - 15 Final  . WBC 09/30/2016 7.6  4.0 - 10.5 K/uL Final  . RBC 09/30/2016 4.05  3.87 - 5.11 MIL/uL Final  . Hemoglobin 09/30/2016 13.9  12.0 - 15.0 g/dL Final  . HCT 09/30/2016 41.7  36.0 - 46.0 % Final  . MCV 09/30/2016 103.0* 78.0 - 100.0 fL Final  . MCH 09/30/2016 34.3* 26.0 - 34.0 pg Final  . MCHC 09/30/2016 33.3  30.0 - 36.0 g/dL Final  . RDW 09/30/2016 14.0  11.5 - 15.5 % Final  . Platelets 09/30/2016 272  150 - 400 K/uL Final  Orders Only on 09/29/2016  Component Date Value Ref Range Status  . WBC 09/29/2016 5.4  3.8 - 10.8 K/uL Final  . RBC 09/29/2016 4.01  3.80 - 5.10 MIL/uL Final  . Hemoglobin 09/29/2016 13.8  11.7 - 15.5 g/dL Final  . HCT 09/29/2016 41.2  35.0 - 45.0 % Final  . MCV 09/29/2016 102.7* 80.0 - 100.0 fL Final  . MCH 09/29/2016 34.4* 27.0 - 33.0 pg Final  . MCHC 09/29/2016 33.5  32.0 - 36.0 g/dL Final  . RDW 09/29/2016 14.7  11.0 - 15.0 %  Final  . Platelets 09/29/2016 312  140 - 400 K/uL Final  . MPV 09/29/2016 9.5  7.5 - 12.5 fL Final  . Neutro Abs 09/29/2016 4428  1,500 - 7,800 cells/uL Final  . Lymphs Abs 09/29/2016 378* 850 - 3,900 cells/uL Final  . Monocytes Absolute 09/29/2016 594  200 - 950 cells/uL Final  . Eosinophils Absolute 09/29/2016 0* 15 - 500 cells/uL Final  . Basophils Absolute 09/29/2016 0  0 - 200 cells/uL Final  . Neutrophils Relative % 09/29/2016 82  % Final  . Lymphocytes Relative 09/29/2016 7  % Final  . Monocytes Relative 09/29/2016 11  % Final  . Eosinophils Relative 09/29/2016 0  % Final  . Basophils Relative 09/29/2016 0  % Final  . Smear Review 09/29/2016 Criteria for review not met   Final  . Sodium 09/29/2016 137  135 - 146 mmol/L Final  . Potassium 09/29/2016 4.9  3.5 - 5.3 mmol/L Final  . Chloride 09/29/2016 99  98 - 110 mmol/L Final  . CO2 09/29/2016 28  20 - 31 mmol/L Final  . Glucose, Bld 09/29/2016 87  65 - 99 mg/dL Final  . BUN 09/29/2016 9  7 - 25 mg/dL Final  . Creat 09/29/2016 0.84  0.60 - 0.93 mg/dL Final   Comment:   For patients > or = 75 years of age: The upper reference limit for Creatinine is approximately 13% higher for people identified as African-American.     . Total Bilirubin 09/29/2016 0.5  0.2 - 1.2 mg/dL Final  . Alkaline Phosphatase 09/29/2016 44  33 - 130 U/L Final  . AST 09/29/2016 28  10 - 35 U/L Final  . ALT 09/29/2016 23  6 - 29 U/L Final  . Total Protein 09/29/2016 6.4  6.1 - 8.1 g/dL Final  . Albumin 09/29/2016 4.0  3.6 - 5.1 g/dL Final  . Calcium 09/29/2016 9.7  8.6 - 10.4 mg/dL Final  . GFR, Est African American 09/29/2016 79  >=60 mL/min Final  . GFR, Est Non African American 09/29/2016 69  >=60 mL/min Final  Orders Only on 07/29/2016  Component Date Value Ref Range Status  . WBC 07/29/2016 5.9  3.8 - 10.8 K/uL Final  . RBC 07/29/2016 3.78* 3.80 - 5.10 MIL/uL Final  . Hemoglobin 07/29/2016 13.2  11.7 - 15.5 g/dL Final  . HCT 07/29/2016 39.0  35.0  - 45.0 % Final  . MCV 07/29/2016 103.2* 80.0 - 100.0 fL Final  . MCH 07/29/2016 34.9* 27.0 - 33.0 pg Final  . MCHC 07/29/2016 33.8  32.0 - 36.0 g/dL Final  . RDW 07/29/2016 15.9* 11.0 - 15.0 % Final  . Platelets 07/29/2016 332  140 - 400 K/uL Final  . MPV 07/29/2016 9.2  7.5 - 12.5 fL Final  . Neutro Abs 07/29/2016 4425  1,500 - 7,800 cells/uL Final  . Lymphs Abs 07/29/2016 649* 850 - 3,900 cells/uL Final  . Monocytes Absolute 07/29/2016 826  200 - 950 cells/uL  Final  . Eosinophils Absolute 07/29/2016 0* 15 - 500 cells/uL Final  . Basophils Absolute 07/29/2016 0  0 - 200 cells/uL Final  . Neutrophils Relative % 07/29/2016 75  % Final  . Lymphocytes Relative 07/29/2016 11  % Final  . Monocytes Relative 07/29/2016 14  % Final  . Eosinophils Relative 07/29/2016 0  % Final  . Basophils Relative 07/29/2016 0  % Final  . Smear Review 07/29/2016 Criteria for review not met   Final  . Sodium 07/29/2016 137  135 - 146 mmol/L Final  . Potassium 07/29/2016 4.9  3.5 - 5.3 mmol/L Final  . Chloride 07/29/2016 99  98 - 110 mmol/L Final  . CO2 07/29/2016 28  20 - 31 mmol/L Final  . Glucose, Bld 07/29/2016 76  65 - 99 mg/dL Final  . BUN 07/29/2016 9  7 - 25 mg/dL Final  . Creat 07/29/2016 0.89  0.60 - 0.93 mg/dL Final   Comment:   For patients > or = 75 years of age: The upper reference limit for Creatinine is approximately 13% higher for people identified as African-American.     . Total Bilirubin 07/29/2016 0.4  0.2 - 1.2 mg/dL Final  . Alkaline Phosphatase 07/29/2016 39  33 - 130 U/L Final  . AST 07/29/2016 32  10 - 35 U/L Final  . ALT 07/29/2016 30* 6 - 29 U/L Final  . Total Protein 07/29/2016 6.2  6.1 - 8.1 g/dL Final  . Albumin 07/29/2016 4.0  3.6 - 5.1 g/dL Final  . Calcium 07/29/2016 9.6  8.6 - 10.4 mg/dL Final  . GFR, Est African American 07/29/2016 74  >=60 mL/min Final  . GFR, Est Non African American 07/29/2016 64  >=60 mL/min Final  . Interferon Gamma Release Assay 07/29/2016  NEGATIVE  NEGATIVE Final   Negative test result. M. tuberculosis complex infection unlikely.  . Quantiferon Nil Value 07/29/2016 0.02  IU/mL Final  . Mitogen-Nil 07/29/2016 0.66  IU/mL Final  . Quantiferon Tb Ag Minus Nil Value 07/29/2016 0.00  IU/mL Final   Comment:   The Nil tube value is used to determine if the patient has a preexisting immune response which could cause a false-positive reading on the test. In order for a test to be valid, the Nil tube must have a value of less than or equal to 8.0 IU/mL.   The mitogen control tube is used to assure the patient has a healthy immune status and also serves as a control for correct blood handling and incubation. It is used to detect false-negative readings. The mitogen tube must have a gamma interferon value of greater than or equal to 0.5 IU/mL higher than the value of the Nil tube.   The TB antigen tube is coated with the M. tuberculosis specific antigens. For a test to be considered positive, the TB antigen tube value minus the Nil tube value must be greater than or equal to 0.35 IU/mL.   For additional information, please refer to http://education.questdiagnostics.com/faq/QFT (This link is being provided for informational/educational purposes only.)   Lab on 07/22/2016  Component Date Value Ref Range Status  . TSH 07/22/2016 1.55  0.35 - 4.50 uIU/mL Final  . Free T4 07/22/2016 1.02  0.60 - 1.60 ng/dL Final   Comment: Specimens from patients who are undergoing biotin therapy and /or ingesting biotin supplements may contain high levels of biotin.  The higher biotin concentration in these specimens interferes with this Free T4 assay.  Specimens that contain high levels  of biotin may cause false high results for this Free T4 assay.  Please interpret results in light of the total clinical presentation of the patient.    . T3, Free 07/22/2016 3.8  2.3 - 4.2 pg/mL Final  . WBC 07/22/2016 7.0  4.0 - 10.5 K/uL Final  . RBC 07/22/2016  4.19  3.87 - 5.11 Mil/uL Final  . Hemoglobin 07/22/2016 14.9  12.0 - 15.0 g/dL Final  . HCT 07/22/2016 43.8  36.0 - 46.0 % Final  . MCV 07/22/2016 104.5* 78.0 - 100.0 fl Final  . MCHC 07/22/2016 34.0  30.0 - 36.0 g/dL Final  . RDW 07/22/2016 16.2* 11.5 - 15.5 % Final  . Platelets 07/22/2016 341.0  150.0 - 400.0 K/uL Final  . Neutrophils Relative % 07/22/2016 74.1  43.0 - 77.0 % Final  . Lymphocytes Relative 07/22/2016 14.4  12.0 - 46.0 % Final  . Monocytes Relative 07/22/2016 11.1  3.0 - 12.0 % Final  . Eosinophils Relative 07/22/2016 0.2  0.0 - 5.0 % Final  . Basophils Relative 07/22/2016 0.2  0.0 - 3.0 % Final  . Neutro Abs 07/22/2016 5.2  1.4 - 7.7 K/uL Final  . Lymphs Abs 07/22/2016 1.0  0.7 - 4.0 K/uL Final  . Monocytes Absolute 07/22/2016 0.8  0.1 - 1.0 K/uL Final  . Eosinophils Absolute 07/22/2016 0.0  0.0 - 0.7 K/uL Final  . Basophils Absolute 07/22/2016 0.0  0.0 - 0.1 K/uL Final  . Sodium 07/22/2016 135  135 - 145 mEq/L Final  . Potassium 07/22/2016 4.7  3.5 - 5.1 mEq/L Final  . Chloride 07/22/2016 97  96 - 112 mEq/L Final  . CO2 07/22/2016 26  19 - 32 mEq/L Final  . Glucose, Bld 07/22/2016 84  70 - 99 mg/dL Final  . BUN 07/22/2016 8  6 - 23 mg/dL Final  . Creatinine, Ser 07/22/2016 0.84  0.40 - 1.20 mg/dL Final  . Total Bilirubin 07/22/2016 0.3  0.2 - 1.2 mg/dL Final  . Alkaline Phosphatase 07/22/2016 44  39 - 117 U/L Final  . AST 07/22/2016 37  0 - 37 U/L Final  . ALT 07/22/2016 38* 0 - 35 U/L Final  . Total Protein 07/22/2016 7.5  6.0 - 8.3 g/dL Final  . Albumin 07/22/2016 4.5  3.5 - 5.2 g/dL Final  . Calcium 07/22/2016 10.4  8.4 - 10.5 mg/dL Final  . GFR 07/22/2016 70.36  >60.00 mL/min Final  . Vitamin B-12 07/22/2016 961* 211 - 911 pg/mL Final  . Sed Rate 07/22/2016 30  0 - 30 mm/hr Final  . CRP 07/22/2016 0.3* 0.5 - 20.0 mg/dL Final  Office Visit on 07/01/2016  Component Date Value Ref Range Status  . Glucose 07/01/2016 94  65 - 99 mg/dL Final  . BUN 07/01/2016 9   8 - 27 mg/dL Final  . Creatinine, Ser 07/01/2016 0.92  0.57 - 1.00 mg/dL Final  . GFR calc non Af Amer 07/01/2016 62  >59 mL/min/1.73 Final  . GFR calc Af Amer 07/01/2016 71  >59 mL/min/1.73 Final  . BUN/Creatinine Ratio 07/01/2016 10* 12 - 28 Final  . Sodium 07/01/2016 141  134 - 144 mmol/L Final  . Potassium 07/01/2016 5.1  3.5 - 5.2 mmol/L Final  . Chloride 07/01/2016 99  96 - 106 mmol/L Final  . CO2 07/01/2016 26  18 - 29 mmol/L Final  . Calcium 07/01/2016 10.0  8.7 - 10.3 mg/dL Final  . Total Protein 07/01/2016 6.7  6.0 - 8.5 g/dL Final  . Albumin  07/01/2016 4.5  3.5 - 4.8 g/dL Final  . Globulin, Total 07/01/2016 2.2  1.5 - 4.5 g/dL Final  . Albumin/Globulin Ratio 07/01/2016 2.0  1.2 - 2.2 Final  . Bilirubin Total 07/01/2016 0.3  0.0 - 1.2 mg/dL Final  . Alkaline Phosphatase 07/01/2016 45  39 - 117 IU/L Final  . AST 07/01/2016 32  0 - 40 IU/L Final  . ALT 07/01/2016 36* 0 - 32 IU/L Final     Imaging: No results found.  Rheumatoid arthritis of bilateral hands with some ulnar deviation in the right hand more so than the left hand with synovial thickening synovitis on bilateral first second and third MCP joint, tenderness to bilateral wrist and bilateral elbow        Speciality Comments: No specialty comments available.    Procedures:  No procedures performed Allergies: Darvon [propoxyphene hcl]; Meperidine hcl; Penicillins; Azathioprine; and Ezetimibe-simvastatin   Assessment / Plan:     Visit Diagnoses: Rheumatoid arthritis involving multiple sites with positive rheumatoid factor (HCC)    Plan: #1: Rheumatoid arthritis with positive rheumatoid factor. Some flare recently. It could've been weather/storm that have rolled through our area that may have been continuing to patient's joint pain and discomfort.  #2:  hrrx xeljanz 82m samples x 2   #3:  Has had 25 skin lesions removed over 2-3 years. And pt has been on xeljanz for 5 - 6 years. Pt feels that xMorrie Sheldon may be causing increased lesions.  #4:  Hand , wrist, elbow jt pain from RA flare.   #5: Return to clinic in 5 months   #6: Recently, patient's methotrexate was reduced from 6 pills a week to 4 pills a week. Patient states that her kidney functions were abnormal and so the adjustment was made. Due to the recent flare and any further flare, we may want to consider increasing the methotrexate to 5 pills per week. Patient [place] by [place]  Orders: No orders of the defined types were placed in this encounter.  Meds ordered this encounter  Medications  . methotrexate (RHEUMATREX) 2.5 MG tablet    Sig: Take 4 tablets (10 mg total) by mouth once a week. Caution:Chemotherapy. Protect from light.    Dispense:  48 tablet    Refill:  0    Order Specific Question:   Supervising Provider    Answer:   DBo Merino[[[id number]] . Tofacitinib Citrate (XELJANZ XR) 11 MG TB24    Sig: Take 11 mg by mouth daily.    Dispense:  60 tablet    Refill:  0    Order Specific Question:   Supervising Provider    Answer:   DBo Merino[[phone]   Order Specific Question:   Lot Number?    Answer:   [id number]   Order Specific Question:   Expiration Date?    Answer:   06/26/2017    Order Specific Question:   Quantity    Answer:   2  . predniSONE (DELTASONE) 1 MG tablet    Sig: Take 4 tablets (4 mg total) by mouth daily with breakfast.    Dispense:  360 tablet    Refill:  1    Order Specific Question:   Supervising Provider    Answer:   DLyda Perone   Face-to-face time spent with patient was 30 minutes. 50% of time was spent in counseling and coordination of care.  Follow-Up Instructions: Return in about 5  months (around 05/21/2017) for RA,xeljanz 56m,hand pain,wj pain, elbow pain,.   NEliezer Lofts PA-C  Note - This record has been created using [employer]  Chart creation errors have been sought, but may not always  have been located. Such creation  errors do not reflect on  the standard of medical care.

## 2016-12-20 LAB — COMPLETE METABOLIC PANEL WITH GFR
ALBUMIN: 4.3 g/dL (ref 3.6–5.1)
ALK PHOS: 47 U/L (ref 33–130)
ALT: 26 U/L (ref 6–29)
AST: 33 U/L (ref 10–35)
BILIRUBIN TOTAL: 0.5 mg/dL (ref 0.2–1.2)
BUN: 10 mg/dL (ref 7–25)
CALCIUM: 9.8 mg/dL (ref 8.6–10.4)
CO2: 26 mmol/L (ref 20–31)
CREATININE: 0.92 mg/dL (ref 0.60–0.93)
Chloride: 99 mmol/L (ref 98–110)
GFR, Est African American: 71 mL/min (ref >=60)
GFR, Est Non African American: 62 mL/min (ref >=60)
GLUCOSE: 92 mg/dL (ref 65–99)
POTASSIUM: 5 mmol/L (ref 3.5–5.3)
Sodium: 137 mmol/L (ref 135–146)
TOTAL PROTEIN: 6.8 g/dL (ref 6.1–8.1)

## 2016-12-22 NOTE — Progress Notes (Signed)
WNL

## 2017-01-01 ENCOUNTER — Other Ambulatory Visit: Payer: Self-pay | Admitting: *Deleted

## 2017-01-01 DIAGNOSIS — L989 Disorder of the skin and subcutaneous tissue, unspecified: Secondary | ICD-10-CM

## 2017-01-06 DIAGNOSIS — Z01419 Encounter for gynecological examination (general) (routine) without abnormal findings: Secondary | ICD-10-CM | POA: Diagnosis not present

## 2017-01-06 DIAGNOSIS — Z6820 Body mass index (BMI) 20.0-20.9, adult: Secondary | ICD-10-CM | POA: Diagnosis not present

## 2017-01-06 DIAGNOSIS — R229 Localized swelling, mass and lump, unspecified: Secondary | ICD-10-CM | POA: Diagnosis not present

## 2017-01-06 DIAGNOSIS — Z1231 Encounter for screening mammogram for malignant neoplasm of breast: Secondary | ICD-10-CM | POA: Diagnosis not present

## 2017-01-06 DIAGNOSIS — R21 Rash and other nonspecific skin eruption: Secondary | ICD-10-CM | POA: Diagnosis not present

## 2017-01-08 ENCOUNTER — Ambulatory Visit: Payer: Medicare Other | Admitting: Rheumatology

## 2017-01-08 ENCOUNTER — Encounter: Payer: Self-pay | Admitting: Internal Medicine

## 2017-01-08 ENCOUNTER — Ambulatory Visit (INDEPENDENT_AMBULATORY_CARE_PROVIDER_SITE_OTHER): Payer: Medicare Other | Admitting: Internal Medicine

## 2017-01-08 VITALS — BP 142/82 | HR 86 | Temp 97.8°F | Resp 16 | Wt 113.0 lb

## 2017-01-08 DIAGNOSIS — R21 Rash and other nonspecific skin eruption: Secondary | ICD-10-CM

## 2017-01-08 DIAGNOSIS — R609 Edema, unspecified: Secondary | ICD-10-CM | POA: Diagnosis not present

## 2017-01-08 DIAGNOSIS — R600 Localized edema: Secondary | ICD-10-CM | POA: Insufficient documentation

## 2017-01-08 LAB — HM MAMMOGRAPHY

## 2017-01-08 MED ORDER — TRIAMCINOLONE ACETONIDE 0.5 % EX OINT: 1.0000 "application " | TOPICAL_OINTMENT | Freq: Two times a day (BID) | CUTANEOUS | 0 refills | Status: DC

## 2017-01-08 NOTE — Progress Notes (Signed)
Subjective:    Patient ID: Sherri [name], female    DOB: 1940-09-02, 75 y.o.   MRN: [id number]  HPI She is here for an acute visit.   March 10th she had eye lid surgery.  She had bruising and lines under her eyes after.  She had to have fillers under her eyes in July.  One week later she had a rash on her face that looked like acne.  She saw her dermatologist and was given clindamycin gel.  This did not help.  She saw her dentist later that month and had a bump   in her mouth.  She her told her she was swollen cyst or gland in her mouth.  She had swelling in her left lower jaw and the dentist was concerned she may need a root canal. She referred her to another specialist and she said her teeth were ok.  She still has the swelling and rash.  In the beginning of the month the plastic surgeon gave her three days of valtrex and that helped with the rash a little. She also did a laser treatment to get rid of the bumps, the laser did not help.   On 8/8 she was prescribed valtrex 3 tab BID x two weeks.  She went back to see someone else in the plastic surgeons office and was advised to use a topical otc cortisone cream, which as not helped.  She is still taking the valtrex.  She is concerned about the rash.  It is irritating and itch a little.  It is not painful. It is around her mouth.  She also still has the low lower jaw swelling that is non tender and she does not know what that is.   Medications and allergies reviewed with patient and updated if appropriate.  Patient Active Problem List   Diagnosis Date Noted  . High risk medication use 09/02/2016  . Urinary frequency 09/02/2016  . Piriformis muscle pain 09/02/2016  . Fatigue 07/22/2016  . Hair loss 07/22/2016  . Right hip pain 04/08/2016  . Right knee pain 02/25/2016  . Dyspepsia 04/05/2015  . Numbness 11/14/2014  . Biceps tendon tear 10/10/2014  . Insomnia 10/10/2014  . Gait disturbance 06/13/2014  . Depression with anxiety  06/13/2014  . OP (osteoporosis) 01/18/2014  . Other long term (current) drug therapy 01/18/2014  . Other dysphagia 12/29/2013  . Hoarseness 12/29/2013  . 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 Personal history of colonic polyps 07/03/2010  . Nocturia 05/28/2010  . THYROID NODULE 05/17/2008  . Multiple sclerosis ([place]) 05/17/2008  . Chronic fatigue 03/30/2007  . Hyperlipidemia 08/20/2006  . Essential hypertension 08/20/2006  . Rheumatoid arthritis ([place]) 08/20/2006  . CERVICAL CANCER, HX OF 08/20/2006  . GASTRIC ULCER, HX OF 08/20/2006    Current Outpatient Prescriptions on File Prior to Visit  Medication Sig Dispense Refill  . atorvastatin (LIPITOR) 10 MG tablet Take 1 tablet by mouth on Mondays and Friday 27 tablet 2  . Biotin 5000 MCG TABS Take 5,000 mcg by mouth daily.     . Calcium Carbonate-Vitamin D (CALCIUM + D PO) Take 1 tablet by mouth daily.     . Carboxymethylcellul-Glycerin (LUBRICATING EYE DROPS OP) Apply 1 drop to eye daily as needed (dry eyes).    . Cholecalciferol (VITAMIN D3) 2000 units capsule Take 2,000 Units by mouth daily.    . clindamycin (CLINDAGEL) 1 % gel APPLY TO ACNE ONCE  TO TWICE A DAY  6  . Cyanocobalamin (VITAMIN B-12 PO) Take 1 tablet by mouth daily.    . diazepam (VALIUM) 5 MG tablet Take 1 tablet (5 mg total) by mouth every 8 (eight) hours as needed. 90 tablet 2  . diclofenac sodium (VOLTAREN) 1 % GEL APPLY 3 GRAMS TO 3 LARGE JOINTS 3 TIMES A DAY AS NEEDED 409 g 3  . folic acid (FOLVITE) 1 MG tablet Take 1 mg by mouth daily.     . furosemide (LASIX) 20 MG tablet TAKE 1 TABLET BY MOUTH EVERY DAY 90 tablet 1  . methotrexate (RHEUMATREX) 2.5 MG tablet Take 4 tablets (10 mg total) by mouth once a week. Caution:Chemotherapy. Protect from light. 48 tablet 0  . Oxycodone HCl 10 MG TABS Take 1 tablet (10 mg total) by mouth 3 (three) times daily as needed. 90 tablet 0  . predniSONE (DELTASONE) 1 MG  tablet Take 4 tablets (4 mg total) by mouth daily with breakfast. 360 tablet 1  . Pyridoxine HCl (VITAMIN B-6 PO) Take 1 tablet by mouth daily.    . ramipril (ALTACE) 5 MG capsule Take 1 capsule (5 mg total) by mouth daily with breakfast. 90 capsule 2  . tiZANidine (ZANAFLEX) 4 MG capsule Take 4 mg by mouth 3 (three) times daily as needed for muscle spasms.    . Tofacitinib Citrate (XELJANZ XR) 11 MG TB24 Take 11 mg by mouth daily. 60 tablet 0  . traMADol (ULTRAM) 50 MG tablet Take 1 tablet (50 mg total) by mouth 4 (four) times daily. 120 tablet 3   No current facility-administered medications on file prior to visit.     Past Medical History:  Diagnosis Date  . Anxiety    takes Valium daily as needed  . Bruises easily    d/t meds  . Cancer ([place])    basal cell ca, in situ- uterine   . Cataracts, bilateral   . Chronic back pain   . Dizziness    r/t to meds  . GERD (gastroesophageal reflux disease)    no meds on a regular basis but will take Tums if needed  . Headache(784.0)    r/t neck issues  . History of bronchitis 6-45yrs ago  . History of colon polyps    benign  . Hyperlipidemia    takes Atorvastatin on Mondays and Fridays  . Hypertension    takes Ramipril daily  . Joint pain   . Joint swelling   . Multiple sclerosis ([place])   . Neuromuscular disorder ([place])    Dr. Park Liter- [facility], follows M.S.  . Nocturia   . OA (osteoarthritis)    Dr Jani Gravel weekly, RA- hands- knees- feet   . PONV (postoperative nausea and vomiting)    trouble urinating after surgery in 2014  . Rheumatoid arthritis(714.0)    Dr Estanislado Pandy takes [name] daily  . Skin cancer   . Urinary retention    sees Dr.Wrenn about 2 times a yr    Past Surgical History:  Procedure Laterality Date  . ABDOMINAL HYSTERECTOMY     1985  . APPENDECTOMY     with TAH  . BACK SURGERY     several  . BROW LIFT Bilateral 10/02/2016   Procedure: BILATERAL LOWER LID BLEPHAROPLASTY;  Surgeon: Clista Bernhardt, MD;  Location: [place];  Service: Plastics;  Laterality: Bilateral;  . CARPAL TUNNEL RELEASE Right   . cataracts    . CERVICAL FUSION     Dr  Botero  . CERVICAL FUSION  12/31/2012   Dr Joya Salm  . CHEST TUBE INSERTION     for traumatic Pneumothorax  . COLONOSCOPY  07/16/2010   normal   . COLONOSCOPY W/ POLYPECTOMY  1997   negative since; Dr Deatra Ina  . ESOPHAGOGASTRODUODENOSCOPY  07/16/2010   normal  . eye lid raise    . EYE SURGERY Bilateral    cataracts removed - /w IOL  . LUMBAR FUSION     Dr Joya Salm  . NASAL SINUS SURGERY    . POSTERIOR CERVICAL FUSION/FORAMINOTOMY N/A 12/31/2012   Procedure: POSTERIOR LATERAL CERVICAL FUSION/FORAMINOTOMY LEVEL 1 CERVICAL THREE-FOUR WITH LATERAL MASS SCREWS;  Surgeon: Floyce Stakes, MD;  Location: [facility];  Service: Neurosurgery;  Laterality: N/A;  . PTOSIS REPAIR Bilateral 10/02/2016   Procedure: INTERNAL PTOSIS REPAIR;  Surgeon: Clista Bernhardt, MD;  Location: [place] [facility];  Service: Plastics;  Laterality: Bilateral;  . SKIN BIOPSY    . THYROID SURGERY     R lobe removed- 1972, has grown back - CT last done- 2018  . TOTAL HIP ARTHROPLASTY  12/22/2011   Procedure: TOTAL HIP ARTHROPLASTY;  Surgeon: Kerin Salen, MD;  Location: [place];  Service: Orthopedics;  Laterality: Left;  . TUBAL LIGATION      Social History   Social History  . Marital status: Married    Spouse name: N/A  . Number of children: N/A  . Years of education: N/A   Occupational History  . retired    Social History Main Topics  . Smoking status: Former Smoker    Quit date: 05/26/1981  . Smokeless tobacco: Never Used     Comment: Smoked [phone], up to 2.5 ppd  . Alcohol use 0.0 oz/week     Comment: 2-3 beers every day 2%  . Drug use: No  . Sexual activity: Yes   Other Topics Concern  . Not on file   Social History Narrative  . No narrative on file    Family History  Problem Relation Age of Onset  . Cancer Mother        pancreatic  . Diabetes Mother    . Heart disease Father        Rheumatic  . Cancer Father        ? stomach  . Cancer Sister        stomach  . Cancer Maternal Aunt        X 4; ? primary  . Heart disease Paternal Aunt   . Cancer Maternal Grandmother        cervical  . Colon cancer Unknown        Aunts  . Multiple sclerosis Daughter   . Hypertension Neg Hx   . Stroke Neg Hx     Review of Systems  Constitutional: Negative for chills and fever.  HENT: Positive for facial swelling (left lower jaw) and mouth sores (swollen gland or cyst per dentist). Negative for sore throat.   Skin: Positive for rash. Negative for color change and wound.  Neurological: Negative for light-headedness, numbness and headaches.       Objective:   Vitals:   01/08/17 1106  BP: (!) 142/82  Pulse: 86  Resp: 16  Temp: 97.8 F (36.6 C)  SpO2: 96%   Filed Weights   01/08/17 1106  Weight: 113 lb (51.3 kg)   Body mass index is 20.02 kg/m.  Wt Readings from Last 3 Encounters:  01/08/17 113 lb (51.3 kg)  12/19/16 116  lb (52.6 kg)  12/16/16 114 lb (51.7 kg)     Physical Exam  Constitutional: She appears well-developed and well-nourished. No distress.  HENT:  Head: Normocephalic and atraumatic.  Right Ear: External ear normal.  Left Ear: External ear normal.  Nose: Nose normal.  Mouth/Throat: Oropharynx is clear and moist. No oropharyngeal exudate.  Swollen cyst in upper left lid - benign appearing  Neck: Neck supple. No tracheal deviation present. No thyromegaly (thyroid nodules) present.  Swollen left submandibular region - mild, non tender, no overlying erythema  Musculoskeletal: She exhibits no edema.  Lymphadenopathy:    She has no cervical adenopathy.  Skin: Skin is warm and dry. Rash (papular rash around mouth only) noted. She is not diaphoretic.          Assessment & Plan:   See Problem List for Assessment and Plan of chronic medical problems.

## 2017-01-08 NOTE — Patient Instructions (Addendum)
Try applying the steroid cream on the rash twice daily for up to two weeks.    For the saliva gland try increasing your fluid intake, suck on sour candy, massage the area and apply heat.    Complete the valtrex prescribed by the plastic surgeon.

## 2017-01-08 NOTE — Assessment & Plan Note (Signed)
?   Dermatitis, ? Reaction to facial fillers, herpes less likely Otc steroid cream not effective Clindamycin get not effective Valtrex initially helped, but not helping now and has been present for at least one month - unlikely viral Will try triamcinolone cream - not to be used for more than 2 weeks If no improvement may need to see derm again

## 2017-01-08 NOTE — Assessment & Plan Note (Signed)
Increase water intake Massage area, apply heat Suck on sour candy If no improvement will refer to ENT

## 2017-01-13 ENCOUNTER — Encounter: Payer: Self-pay | Admitting: Internal Medicine

## 2017-01-15 ENCOUNTER — Telehealth: Payer: Self-pay | Admitting: Rheumatology
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
## 2017-01-15 NOTE — Telephone Encounter (Signed)
Patient calling because she wants to get back to riding her bike. Patient wondering if bone density will keep her from doing this. Please call to advise.

## 2017-01-15 NOTE — Telephone Encounter (Signed)
She has increased risk of fracture if she falls. It'll be her personal choice if she wants to ride bike. A stationary bike would be a better choice.

## 2017-01-15 NOTE — Telephone Encounter (Signed)
Patient advised of recommendation. Patient verbalized understanding.  

## 2017-01-28 ENCOUNTER — Telehealth: Payer: Self-pay | Admitting: Neurology

## 2017-01-28 ENCOUNTER — Encounter: Payer: Self-pay | Admitting: Internal Medicine

## 2017-01-28 MED ORDER — OXYCODONE HCL 10 MG PO TABS: 10.0000 mg | ORAL_TABLET | Freq: Three times a day (TID) | ORAL | 0 refills | Status: DC | PRN

## 2017-01-28 NOTE — Telephone Encounter (Signed)
Rx. awaiting RAS sig/fim 

## 2017-01-28 NOTE — Telephone Encounter (Signed)
Patient requesting refill of Oxycodone HCl 10 MG TABS. °

## 2017-01-28 NOTE — Addendum Note (Signed)
Addended by: France Ravens I on: 01/28/2017 10:12 AM   Modules accepted: Orders

## 2017-01-28 NOTE — Telephone Encounter (Signed)
Rx. up front [facility] 

## 2017-01-31 ENCOUNTER — Other Ambulatory Visit: Payer: Self-pay | Admitting: Internal Medicine

## 2017-02-02 ENCOUNTER — Encounter: Payer: Self-pay | Admitting: Internal Medicine

## 2017-02-02 ENCOUNTER — Telehealth: Payer: Self-pay | Admitting: Internal Medicine

## 2017-02-02 ENCOUNTER — Ambulatory Visit (INDEPENDENT_AMBULATORY_CARE_PROVIDER_SITE_OTHER): Payer: Medicare Other | Admitting: Internal Medicine

## 2017-02-02 ENCOUNTER — Other Ambulatory Visit: Payer: Self-pay | Admitting: Internal Medicine

## 2017-02-02 VITALS — BP 150/90 | HR 80 | Temp 97.6°F | Resp 16 | Wt 116.0 lb

## 2017-02-02 DIAGNOSIS — W5501XA Bitten by cat, initial encounter: Secondary | ICD-10-CM | POA: Diagnosis not present

## 2017-02-02 DIAGNOSIS — S51852A Open bite of left forearm, initial encounter: Secondary | ICD-10-CM

## 2017-02-02 MED ORDER — DOXYCYCLINE HYCLATE 100 MG PO TABS: 100.0000 mg | ORAL_TABLET | Freq: Two times a day (BID) | ORAL | 0 refills | Status: DC

## 2017-02-02 NOTE — Telephone Encounter (Signed)
Patient has called back requesting antibiotic w/o appt.

## 2017-02-02 NOTE — Telephone Encounter (Signed)
She should be seen - we can see her this afternoon

## 2017-02-02 NOTE — Progress Notes (Signed)
Subjective:    Patient ID: Sherri [name], female    DOB: [birth year], 76 y.o.   MRN: [id number]  HPI She is here for an acute visit for a cat scartch.   One week ago her cat tried to wake her up with her mouth on her left forearm.  The cat does this and is usually very gentile.  She was startled and pulled her arm.  The cat's tooth scratched her arm.  It did bleed and she grabbed her own arm and squeezed it to stop the bleeding.  She immediately washed it out with soap and water and then H2O2.  She put and anti-bacterial ointment on it and then a Band-Aid.   She thinks she caused the bruising when she grabbed it.  The cut had been doing very well until the past couple of days.  She denies fever, chills, discharge, numbness/tingling or swelling.  The past few days she has been putting an [place] with something on it that will make it heal faster.  The area arm the cut that was covered by the Band-Aid is what started hurting and is slightly swollen just in the past couple of days.     It really did not hurt much until a few minutes about when she took the Band-Aid off.   Medications and allergies reviewed with patient and updated if appropriate.  Patient Active Problem List   Diagnosis Date Noted  . Rash and nonspecific skin eruption 01/08/2017  . Submandibular gland swelling 01/08/2017  . High risk medication use 09/02/2016  . Urinary frequency 09/02/2016  . Piriformis muscle pain 09/02/2016  . Fatigue 07/22/2016  . Hair loss 07/22/2016  . Right hip pain 04/08/2016  . Right knee pain 02/25/2016  . Dyspepsia 04/05/2015  . Numbness 11/14/2014  . Biceps tendon tear 10/10/2014  . Insomnia 10/10/2014  . Gait disturbance 06/13/2014  . 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 OP (osteoporosis) 01/18/2014  . Other long term (current) drug therapy 01/18/2014  . Other dysphagia 12/29/2013  . Hoarseness 12/29/2013  . 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 Spondylolisthesis of  C3-4 s/p fusion C4-7 01/01/2013  . Personal history of colonic polyps 07/03/2010  . Nocturia 05/28/2010  . THYROID NODULE 05/17/2008  . Multiple sclerosis ([place]) 05/17/2008  . Chronic fatigue 03/30/2007  . Hyperlipidemia 08/20/2006  . Essential hypertension 08/20/2006  . Rheumatoid arthritis ([place]) 08/20/2006  . CERVICAL CANCER, HX OF 08/20/2006  . GASTRIC ULCER, HX OF 08/20/2006    Current Outpatient Prescriptions on File Prior to Visit  Medication Sig Dispense Refill  . atorvastatin (LIPITOR) 10 MG tablet Take 1 tablet by mouth on Mondays and Friday 27 tablet 2  . Biotin 5000 MCG TABS Take 5,000 mcg by mouth daily.     . Calcium Carbonate-Vitamin D (CALCIUM + D PO) Take 1 tablet by mouth daily.     . Carboxymethylcellul-Glycerin (LUBRICATING EYE DROPS OP) Apply 1 drop to eye daily as needed (dry eyes).    . Cholecalciferol (VITAMIN D3) 2000 units capsule Take 2,000 Units by mouth daily.    . clindamycin (CLINDAGEL) 1 % gel APPLY TO ACNE ONCE TO TWICE A DAY  6  . Cyanocobalamin (VITAMIN B-12 PO) Take 1 tablet by mouth daily.    . diazepam (VALIUM) 5 MG tablet Take 1 tablet (5 mg total) by mouth every 8 (eight) hours as needed. 90 tablet 2  . diclofenac sodium (VOLTAREN) 1 % GEL APPLY 3 GRAMS TO 3 LARGE JOINTS  3 TIMES A DAY AS NEEDED 528 g 3  . folic acid (FOLVITE) 1 MG tablet Take 1 mg by mouth daily.     . furosemide (LASIX) 20 MG tablet TAKE 1 TABLET BY MOUTH EVERY DAY 90 tablet 0  . methotrexate (RHEUMATREX) 2.5 MG tablet Take 4 tablets (10 mg total) by mouth once a week. Caution:Chemotherapy. Protect from light. 48 tablet 0  . Oxycodone HCl 10 MG TABS Take 1 tablet (10 mg total) by mouth 3 (three) times daily as needed. 90 tablet 0  . predniSONE (DELTASONE) 1 MG tablet Take 4 tablets (4 mg total) by mouth daily with breakfast. 360 tablet 1  . Pyridoxine HCl (VITAMIN B-6 PO) Take 1 tablet by mouth daily.    . ramipril (ALTACE) 5 MG capsule TAKE ONE CAPSULE BY MOUTH DAILY WITH  BREAKFAST 90 capsule 0  . tiZANidine (ZANAFLEX) 4 MG capsule Take 4 mg by mouth 3 (three) times daily as needed for muscle spasms.    . Tofacitinib Citrate (XELJANZ XR) 11 MG TB24 Take 11 mg by mouth daily. 60 tablet 0  . traMADol (ULTRAM) 50 MG tablet Take 1 tablet (50 mg total) by mouth 4 (four) times daily. 120 tablet 3  . triamcinolone ointment (KENALOG) 0.5 % Apply 1 application topically 2 (two) times daily. Do not use for more than 2 weeks 30 g 0   No current facility-administered medications on file prior to visit.     Past Medical History:  Diagnosis Date  . Anxiety    takes Valium daily as needed  . Bruises easily    d/t meds  . Cancer ([place])    basal cell ca, in situ- uterine   . Cataracts, bilateral   . Chronic back pain   . Dizziness    r/t to meds  . GERD (gastroesophageal reflux disease)    no meds on a regular basis but will take Tums if needed  . Headache(784.0)    r/t neck issues  . History of bronchitis 6-26yrs ago  . History of colon polyps    benign  . Hyperlipidemia    takes Atorvastatin on Mondays and Fridays  . Hypertension    takes Ramipril daily  . Joint pain   . Joint swelling   . Multiple sclerosis ([place])   . Neuromuscular disorder ([place])    Dr. Park Liter- [facility], follows M.S.  . Nocturia   . OA (osteoarthritis)    Dr Jani Gravel weekly, RA- hands- knees- feet   . PONV (postoperative nausea and vomiting)    trouble urinating after surgery in 2014  . Rheumatoid arthritis(714.0)    Dr Estanislado Pandy takes [name] daily  . Skin cancer   . Urinary retention    sees Dr.Wrenn about 2 times a yr    Past Surgical History:  Procedure Laterality Date  . ABDOMINAL HYSTERECTOMY     1985  . APPENDECTOMY     with TAH  . BACK SURGERY     several  . BROW LIFT Bilateral 10/02/2016   Procedure: BILATERAL LOWER LID BLEPHAROPLASTY;  Surgeon: Clista Bernhardt, MD;  Location: [facility];  Service: Plastics;  Laterality: Bilateral;  . CARPAL TUNNEL  RELEASE Right   . cataracts    . CERVICAL FUSION     Dr Joya Salm  . CERVICAL FUSION  12/31/2012   Dr Joya Salm  . CHEST TUBE INSERTION     for traumatic Pneumothorax  . COLONOSCOPY  07/16/2010   normal   . COLONOSCOPY W/  POLYPECTOMY  1997   negative since; Dr Deatra Ina  . ESOPHAGOGASTRODUODENOSCOPY  07/16/2010   normal  . eye lid raise    . EYE SURGERY Bilateral    cataracts removed - /w IOL  . LUMBAR FUSION     Dr Joya Salm  . NASAL SINUS SURGERY    . POSTERIOR CERVICAL FUSION/FORAMINOTOMY N/A 12/31/2012   Procedure: POSTERIOR LATERAL CERVICAL FUSION/FORAMINOTOMY LEVEL 1 CERVICAL THREE-FOUR WITH LATERAL MASS SCREWS;  Surgeon: Floyce Stakes, MD;  Location: [facility];  Service: Neurosurgery;  Laterality: N/A;  . PTOSIS REPAIR Bilateral 10/02/2016   Procedure: INTERNAL PTOSIS REPAIR;  Surgeon: Clista Bernhardt, MD;  Location: [facility];  Service: Plastics;  Laterality: Bilateral;  . SKIN BIOPSY    . THYROID SURGERY     R lobe removed- 1972, has grown back - CT last done- 2018  . TOTAL HIP ARTHROPLASTY  12/22/2011   Procedure: TOTAL HIP ARTHROPLASTY;  Surgeon: Kerin Salen, MD;  Location: [facility];  Service: Orthopedics;  Laterality: Left;  . TUBAL LIGATION      Social History   Social History  . Marital status: Married    Spouse name: N/A  . Number of children: N/A  . Years of education: N/A   Occupational History  . retired    Social History Main Topics  . Smoking status: Former Smoker    Quit date: 05/26/1981  . Smokeless tobacco: Never Used     Comment: Smoked [phone], up to 2.5 ppd  . Alcohol use 0.0 oz/week     Comment: 2-3 beers every day 2%  . Drug use: No  . Sexual activity: Yes   Other Topics Concern  . Not on file   Social History Narrative  . No narrative on file    Family History  Problem Relation Age of Onset  . Cancer Mother        pancreatic  . Diabetes Mother   . Heart disease Father        Rheumatic  . Cancer Father        ? stomach  . Cancer  Sister        stomach  . Cancer Maternal Aunt        X 4; ? primary  . Heart disease Paternal Aunt   . Cancer Maternal Grandmother        cervical  . Colon cancer Unknown        Aunts  . Multiple sclerosis Daughter   . Hypertension Neg Hx   . Stroke Neg Hx     Review of Systems  Constitutional: Negative for chills and fever.  Cardiovascular: Negative for leg swelling (no hand swelling).  Skin: Positive for color change and wound. Negative for rash.  Neurological: Negative for light-headedness, numbness and headaches.       Objective:   Vitals:   02/02/17 1452  BP: (!) 150/90  Pulse: 80  Resp: 16  Temp: 97.6 F (36.4 C)  SpO2: 98%   Filed Weights   02/02/17 1452  Weight: 116 lb (52.6 kg)   Body mass index is 20.55 kg/m.  Wt Readings from Last 3 Encounters:  02/02/17 116 lb (52.6 kg)  01/08/17 113 lb (51.3 kg)  12/19/16 116 lb (52.6 kg)     Physical Exam  Constitutional: She appears well-developed and well-nourished. No distress.  HENT:  Head: Normocephalic and atraumatic.  Musculoskeletal: She exhibits no edema.  FROM left hand and fingers  Neurological:  Normal sensation and strength  b/l UE  Skin: She is not diaphoretic.  Small superficial laceration from cats tooth with mild surrounding erythema and swelling - area minimally tender.  Large area of ecchymosis that is non-tender and non swollen          Assessment & Plan:   See Problem List for Assessment and Plan of chronic medical problems.

## 2017-02-02 NOTE — Telephone Encounter (Signed)
Pts is being seen in clinic today by Dr Quay Burow.

## 2017-02-02 NOTE — Telephone Encounter (Signed)
Patient Name: Sherri [name]  DOB: May 10, 1941    Initial Comment Pt would like to be seen- She has a cat scratch and has turned black   Nurse Assessment  Nurse: Raphael Gibney, RN, Vanita Ingles Date/Time (Eastern Time): 02/02/2017 8:10:27 AM  Confirm and document reason for call. If symptomatic, describe symptoms. ---Caller states she got scratched by her cat's tooth on her left arm about a week ago. has used neosporin oint on the area. Area is tender. Yesterday the area is darker than a normal bruise; is about 2 inches wide and goes from her elbow to her wrist. She is takes Prednisone.  Does the patient have any new or worsening symptoms? ---Yes  Will a triage be completed? ---Yes  Related visit to physician within the last 2 weeks? ---No  Does the PT have any chronic conditions? (i.e. diabetes, asthma, etc.) ---Yes  List chronic conditions. ---MS  Is this a behavioral health or substance abuse call? ---No     Guidelines    Guideline Title Affirmed Question Affirmed Notes  Animal Bite [1] Puncture wound (hole through the skin) from claws or teeth AND [2] cat    Final Disposition User   See Physician within 4 Hours (or PCP triage) Raphael Gibney, RN, [place]    Comments  pt states she does not want to make appt but wants antibiotics called in. Please call pt back regarding antibiotics.   Referrals  GO TO FACILITY REFUSED   Disagree/Comply: Disagree  Disagree/Comply Reason: Disagree with instructions

## 2017-02-02 NOTE — Patient Instructions (Addendum)
Continue local wound care.  Apply neosporin and keep it covered for now.  Do not use any band aids.   A prescription for doxycycline was given - use this only if there is no improvement or worsening in the next 24-48 hours.    Call with any questions or concerns.

## 2017-02-02 NOTE — Assessment & Plan Note (Signed)
Td up to date Bruising related to grabbing her own arm -not any injury from the cat Small laceration is superificial and healing well Minimal redness, pain an swelling just around laceration that appears to be a reaction to the band-aid. Avoid band-aids - use non stick bandages and no tape Neosporin topically She is on two immunosuppressants and we need to have a low threshold for antibitoics I will prescribe doxycycline and she will use this in the next 1-2 days if no improvement or any worsening  Call with questions

## 2017-02-11 ENCOUNTER — Telehealth: Payer: Self-pay | Admitting: *Deleted

## 2017-02-11 NOTE — Telephone Encounter (Signed)
Medication Samples have been provided to the patient.  Drug name: [name]      Strength: 11 mg        Qty: 2   ZRA:[id number]  Exp.Date: 03/2018  Dosing instructions: Take 1 tablet by mouth daily.   The patient has been instructed regarding the correct time, dose, and frequency of taking this medication, including desired effects and most common side effects.   Gwenlyn Perking 11:08 AM 02/11/2017

## 2017-02-12 ENCOUNTER — Telehealth: Payer: Self-pay | Admitting: *Deleted

## 2017-02-12 NOTE — Telephone Encounter (Signed)
Patient states she is having trouble with her her right foot. Patient states it is in her heel and ankle. Patient states this started about 3 days ago. Patient states the pain got worse last night and is unable to bear weight and it is painful to walk on it. Patient states she has tried heat and tramadol with no relief. Patient would like to know what you suggest.

## 2017-02-12 NOTE — Telephone Encounter (Signed)
We have open appointments. She may see Dr. Sharol Given .

## 2017-02-12 NOTE — Telephone Encounter (Signed)
Patient has been advised to schedule Dr. Sharol Given. Patient will call to schedule.

## 2017-02-16 ENCOUNTER — Ambulatory Visit (INDEPENDENT_AMBULATORY_CARE_PROVIDER_SITE_OTHER): Payer: Medicare Other | Admitting: Orthopedic Surgery

## 2017-02-16 ENCOUNTER — Other Ambulatory Visit: Payer: Self-pay | Admitting: Internal Medicine

## 2017-02-16 ENCOUNTER — Ambulatory Visit (INDEPENDENT_AMBULATORY_CARE_PROVIDER_SITE_OTHER): Payer: Medicare Other

## 2017-02-16 DIAGNOSIS — M722 Plantar fascial fibromatosis: Secondary | ICD-10-CM | POA: Diagnosis not present

## 2017-02-16 DIAGNOSIS — M79671 Pain in right foot: Secondary | ICD-10-CM

## 2017-02-16 DIAGNOSIS — M7661 Achilles tendinitis, right leg: Secondary | ICD-10-CM

## 2017-02-16 MED ORDER — LIDOCAINE HCL 1 % IJ SOLN
2.0000 mL | INTRAMUSCULAR | Status: AC | PRN
  Administered 2017-02-16: 2 mL

## 2017-02-16 MED ORDER — METHYLPREDNISOLONE ACETATE 40 MG/ML IJ SUSP
40.0000 mg | INTRAMUSCULAR | Status: AC | PRN
  Administered 2017-02-16: 40 mg
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
## 2017-02-25 ENCOUNTER — Encounter: Payer: Self-pay | Admitting: Internal Medicine

## 2017-02-26 ENCOUNTER — Telehealth: Payer: Self-pay | Admitting: Internal Medicine

## 2017-02-26 NOTE — Telephone Encounter (Signed)
Rec'd from [place] forwarded 3 pages to Binnie Rail MD

## 2017-02-27 ENCOUNTER — Telehealth: Payer: Self-pay | Admitting: Internal Medicine

## 2017-02-27 NOTE — Telephone Encounter (Signed)
Rec'd from [place] forwarded 6 pages to Dr.Burns Erline Levine

## 2017-03-02 ENCOUNTER — Ambulatory Visit (INDEPENDENT_AMBULATORY_CARE_PROVIDER_SITE_OTHER): Payer: Medicare Other | Admitting: Orthopedic Surgery

## 2017-03-02 ENCOUNTER — Encounter (INDEPENDENT_AMBULATORY_CARE_PROVIDER_SITE_OTHER): Payer: Self-pay

## 2017-03-04 DIAGNOSIS — D0461 Carcinoma in situ of skin of right upper limb, including shoulder: Secondary | ICD-10-CM | POA: Diagnosis not present

## 2017-03-04 DIAGNOSIS — L570 Actinic keratosis: Secondary | ICD-10-CM | POA: Diagnosis not present

## 2017-03-04 DIAGNOSIS — D0472 Carcinoma in situ of skin of left lower limb, including hip: Secondary | ICD-10-CM | POA: Diagnosis not present

## 2017-03-04 DIAGNOSIS — D0462 Carcinoma in situ of skin of left upper limb, including shoulder: Secondary | ICD-10-CM | POA: Diagnosis not present

## 2017-03-04 DIAGNOSIS — L089 Local infection of the skin and subcutaneous tissue, unspecified: Secondary | ICD-10-CM | POA: Diagnosis not present

## 2017-03-10 ENCOUNTER — Other Ambulatory Visit: Payer: Self-pay | Admitting: Rheumatology

## 2017-03-10 NOTE — Telephone Encounter (Addendum)
Last visit: 12/19/16 Next visit: 06/02/17 Labs: 12/19/16 WNL   Advised patient she is due for labs this month.   Ok to refill per Dr. Estanislado Pandy.

## 2017-03-11 ENCOUNTER — Other Ambulatory Visit: Payer: Self-pay | Admitting: *Deleted

## 2017-03-11 ENCOUNTER — Other Ambulatory Visit: Payer: Self-pay

## 2017-03-11 DIAGNOSIS — Z79899 Other long term (current) drug therapy: Secondary | ICD-10-CM | POA: Diagnosis not present

## 2017-03-11 LAB — CBC WITH DIFFERENTIAL/PLATELET
BASOS PCT: 0.5 %
Basophils Absolute: 32 cells/uL (ref 0–200)
EOS ABS: 19 {cells}/uL (ref 15–500)
Eosinophils Relative: 0.3 %
HCT: 39.8 % (ref 35.0–45.0)
HEMOGLOBIN: 14 g/dL (ref 11.7–15.5)
Lymphs Abs: 920 cells/uL (ref 850–3900)
MCH: 35.1 pg — AB (ref 27.0–33.0)
MCHC: 35.2 g/dL (ref 32.0–36.0)
MCV: 99.7 fL (ref 80.0–100.0)
MONOS PCT: 9.2 %
MPV: 9.9 fL (ref 7.5–12.5)
NEUTROS ABS: 4750 {cells}/uL (ref 1500–7800)
Neutrophils Relative %: 75.4 %
Platelets: 312 10*3/uL (ref 140–400)
RBC: 3.99 10*6/uL (ref 3.80–5.10)
RDW: 15.6 % — ABNORMAL HIGH (ref 11.0–15.0)
Total Lymphocyte: 14.6 %
WBC: 6.3 10*3/uL (ref 3.8–10.8)
WBCMIX: 580 {cells}/uL (ref 200–950)

## 2017-03-11 LAB — COMPLETE METABOLIC PANEL WITH GFR
AG Ratio: 1.8 (calc) (ref 1.0–2.5)
ALBUMIN MSPROF: 4.3 g/dL (ref 3.6–5.1)
ALT: 22 U/L (ref 6–29)
AST: 27 U/L (ref 10–35)
Alkaline phosphatase (APISO): 50 U/L (ref 33–130)
BILIRUBIN TOTAL: 0.5 mg/dL (ref 0.2–1.2)
BUN: 10 mg/dL (ref 7–25)
CALCIUM: 9.9 mg/dL (ref 8.6–10.4)
CHLORIDE: 99 mmol/L (ref 98–110)
CO2: 31 mmol/L (ref 20–32)
CREATININE: 0.84 mg/dL (ref 0.60–0.93)
GFR, EST AFRICAN AMERICAN: 79 mL/min/{1.73_m2} (ref >=60)
GFR, EST NON AFRICAN AMERICAN: 68 mL/min/{1.73_m2} (ref >=60)
GLUCOSE: 88 mg/dL (ref 65–99)
Globulin: 2.4 g/dL (calc) (ref 1.9–3.7)
Potassium: 5.4 mmol/L — ABNORMAL HIGH (ref 3.5–5.3)
Sodium: 137 mmol/L (ref 135–146)
TOTAL PROTEIN: 6.7 g/dL (ref 6.1–8.1)

## 2017-03-11 NOTE — Progress Notes (Signed)
K is high. I am not sure if pt. Is taking K. Please notify Pt and fax results to her PCP.

## 2017-03-13 ENCOUNTER — Ambulatory Visit (INDEPENDENT_AMBULATORY_CARE_PROVIDER_SITE_OTHER): Payer: Medicare Other | Admitting: Internal Medicine

## 2017-03-13 ENCOUNTER — Encounter: Payer: Self-pay | Admitting: Internal Medicine

## 2017-03-13 VITALS — BP 142/82 | HR 83 | Temp 97.7°F | Resp 16 | Wt 114.0 lb

## 2017-03-13 DIAGNOSIS — E875 Hyperkalemia: Secondary | ICD-10-CM | POA: Insufficient documentation

## 2017-03-13 DIAGNOSIS — R21 Rash and other nonspecific skin eruption: Secondary | ICD-10-CM | POA: Diagnosis not present

## 2017-03-13 DIAGNOSIS — R0609 Other forms of dyspnea: Secondary | ICD-10-CM | POA: Diagnosis not present

## 2017-03-13 DIAGNOSIS — I10 Essential (primary) hypertension: Secondary | ICD-10-CM

## 2017-03-13 MED ORDER — NEBIVOLOL HCL 5 MG PO TABS: 5.0000 mg | ORAL_TABLET | Freq: Every day | ORAL | 5 refills | Status: DC

## 2017-03-13 NOTE — Patient Instructions (Addendum)
   Medications reviewed and updated.  Changes include stopping the ramipril and start bystolic 5 mg daily.    Your prescription(s) have been submitted to your pharmacy. Please take as directed and contact our office if you believe you are having problem(s) with the medication(s).    A referral was ordered for cardiology.

## 2017-03-13 NOTE — Progress Notes (Signed)
Subjective:    Patient ID: Sherri [name], female    DOB: 05-01-1941, 76 y.o.   MRN: [id number]  HPI She is here for an acute visit.   She still has a rash around her nose/mouth that burns at times.  She is still taking the valtrex, but is not sure it works.  She has seen dermatology, plastic surgery and other doctors for this.  She has taken valtrex and a topical antibiotic and a topical hydrocortisone cream.  The rash started just after getting fillers in her face after eye surgery.   Hyperkalemia:  She is here today because her potassium recently was high - it has been on the upper side of normal for a while, but normal.  She takes ramipril daily.  She has been taking hemp oil, eating a banana daily and drinking cherry juice daily.  She eats a lot of fruit.    Hypertension:  Her blood pressure has been elevated recently.  She is taking her medication daily.  She denies chest pain, palpitations, edema.  She has had some mild headaches.  She has occasional lightheadedness.   DOE:  For the past two weeks she has noticed sob with exertion which is different for her.  She denies cp and palpitations.  She denies edema.    Medications and allergies reviewed with patient and updated if appropriate.  Patient Active Problem List   Diagnosis Date Noted  . Cat bite 02/02/2017  . Rash and nonspecific skin eruption 01/08/2017  . Submandibular gland swelling 01/08/2017  . High risk medication use 09/02/2016  . Urinary frequency 09/02/2016  . Piriformis muscle pain 09/02/2016  . Fatigue 07/22/2016  . Hair loss 07/22/2016  . Right hip pain 04/08/2016  . Right knee pain 02/25/2016  . Dyspepsia 04/05/2015  . Numbness 11/14/2014  . Biceps tendon tear 10/10/2014  . Insomnia 10/10/2014  . Gait disturbance 06/13/2014  . 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 OP (osteoporosis) 01/18/2014  . Other long term (current) drug therapy 01/18/2014  . Other dysphagia 12/29/2013  . Hoarseness 12/29/2013   . 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 Personal history of colonic polyps 07/03/2010  . Nocturia 05/28/2010  . THYROID NODULE 05/17/2008  . Multiple sclerosis ([place]) 05/17/2008  . Chronic fatigue 03/30/2007  . Hyperlipidemia 08/20/2006  . Essential hypertension 08/20/2006  . Rheumatoid arthritis ([place]) 08/20/2006  . CERVICAL CANCER, HX OF 08/20/2006  . GASTRIC ULCER, HX OF 08/20/2006    Current Outpatient Prescriptions on File Prior to Visit  Medication Sig Dispense Refill  . atorvastatin (LIPITOR) 10 MG tablet TAKE 1 TABLET BY MOUTH ON MONDAYS AND FRIDAY 27 tablet 2  . Biotin 5000 MCG TABS Take 5,000 mcg by mouth daily.     . Calcium Carbonate-Vitamin D (CALCIUM + D PO) Take 1 tablet by mouth daily.     . Carboxymethylcellul-Glycerin (LUBRICATING EYE DROPS OP) Apply 1 drop to eye daily as needed (dry eyes).    . Cholecalciferol (VITAMIN D3) 2000 units capsule Take 2,000 Units by mouth daily.    . Cyanocobalamin (VITAMIN B-12 PO) Take 1 tablet by mouth daily.    . diazepam (VALIUM) 5 MG tablet Take 1 tablet (5 mg total) by mouth every 8 (eight) hours as needed. 90 tablet 2  . diclofenac sodium (VOLTAREN) 1 % GEL APPLY 3 GRAMS TO 3 LARGE JOINTS 3 TIMES A DAY AS NEEDED 100 g 3  . doxycycline (VIBRA-TABS) 100  MG tablet Take 1 tablet (100 mg total) by mouth 2 (two) times daily. 14 tablet 0  . folic acid (FOLVITE) 1 MG tablet Take 1 mg by mouth daily.     . furosemide (LASIX) 20 MG tablet TAKE 1 TABLET BY MOUTH EVERY DAY 90 tablet 0  . methotrexate (RHEUMATREX) 2.5 MG tablet TAKE 4 TABLETS (10 MG TOTAL) BY MOUTH ONCE A WEEK. CAUTION:CHEMOTHERAPY. PROTECT FROM LIGHT. 48 tablet 0  . Oxycodone HCl 10 MG TABS Take 1 tablet (10 mg total) by mouth 3 (three) times daily as needed. 90 tablet 0  . predniSONE (DELTASONE) 1 MG tablet Take 4 tablets (4 mg total) by mouth daily with breakfast. 360 tablet 1  . Pyridoxine HCl (VITAMIN B-6 PO)  Take 1 tablet by mouth daily.    . ramipril (ALTACE) 5 MG capsule TAKE ONE CAPSULE BY MOUTH DAILY WITH BREAKFAST 90 capsule 0  . tiZANidine (ZANAFLEX) 4 MG capsule Take 4 mg by mouth 3 (three) times daily as needed for muscle spasms.    . traMADol (ULTRAM) 50 MG tablet Take 1 tablet (50 mg total) by mouth 4 (four) times daily. 120 tablet 3  . triamcinolone ointment (KENALOG) 0.5 % Apply 1 application topically 2 (two) times daily. Do not use for more than 2 weeks 30 g 0  . Tofacitinib Citrate (XELJANZ XR) 11 MG TB24 Take 11 mg by mouth daily. 60 tablet 0   No current facility-administered medications on file prior to visit.     Past Medical History:  Diagnosis Date  . Anxiety    takes Valium daily as needed  . Bruises easily    d/t meds  . Cancer ([place])    basal cell ca, in situ- uterine   . Cataracts, bilateral   . Chronic back pain   . Dizziness    r/t to meds  . GERD (gastroesophageal reflux disease)    no meds on a regular basis but will take Tums if needed  . Headache(784.0)    r/t neck issues  . History of bronchitis 6-60yrs ago  . History of colon polyps    benign  . Hyperlipidemia    takes Atorvastatin on Mondays and Fridays  . Hypertension    takes Ramipril daily  . Joint pain   . Joint swelling   . Multiple sclerosis ([place])   . Neuromuscular disorder ([place])    Dr. Park Liter- [facility], follows M.S.  . Nocturia   . OA (osteoarthritis)    Dr Jani Gravel weekly, RA- hands- knees- feet   . PONV (postoperative nausea and vomiting)    trouble urinating after surgery in 2014  . Rheumatoid arthritis(714.0)    Dr Estanislado Pandy takes [name] daily  . Skin cancer   . Urinary retention    sees Dr.Wrenn about 2 times a yr    Past Surgical History:  Procedure Laterality Date  . ABDOMINAL HYSTERECTOMY     1985  . APPENDECTOMY     with TAH  . BACK SURGERY     several  . BROW LIFT Bilateral 10/02/2016   Procedure: BILATERAL LOWER LID BLEPHAROPLASTY;  Surgeon:  Clista Bernhardt, MD;  Location: [facility];  Service: Plastics;  Laterality: Bilateral;  . CARPAL TUNNEL RELEASE Right   . cataracts    . CERVICAL FUSION     Dr Joya Salm  . CERVICAL FUSION  12/31/2012   Dr Joya Salm  . CHEST TUBE INSERTION     for traumatic Pneumothorax  . COLONOSCOPY  07/16/2010  normal   . COLONOSCOPY W/ POLYPECTOMY  1997   negative since; Dr Deatra Ina  . ESOPHAGOGASTRODUODENOSCOPY  07/16/2010   normal  . eye lid raise    . EYE SURGERY Bilateral    cataracts removed - /w IOL  . LUMBAR FUSION     Dr Joya Salm  . NASAL SINUS SURGERY    . POSTERIOR CERVICAL FUSION/FORAMINOTOMY N/A 12/31/2012   Procedure: POSTERIOR LATERAL CERVICAL FUSION/FORAMINOTOMY LEVEL 1 CERVICAL THREE-FOUR WITH LATERAL MASS SCREWS;  Surgeon: Floyce Stakes, MD;  Location: [facility];  Service: Neurosurgery;  Laterality: N/A;  . PTOSIS REPAIR Bilateral 10/02/2016   Procedure: INTERNAL PTOSIS REPAIR;  Surgeon: Clista Bernhardt, MD;  Location: [facility];  Service: Plastics;  Laterality: Bilateral;  . SKIN BIOPSY    . THYROID SURGERY     R lobe removed- 1972, has grown back - CT last done- 2018  . TOTAL HIP ARTHROPLASTY  12/22/2011   Procedure: TOTAL HIP ARTHROPLASTY;  Surgeon: Kerin Salen, MD;  Location: [facility];  Service: Orthopedics;  Laterality: Left;  . TUBAL LIGATION      Social History   Social History  . Marital status: Married    Spouse name: N/A  . Number of children: N/A  . Years of education: N/A   Occupational History  . retired    Social History Main Topics  . Smoking status: Former Smoker    Quit date: 05/26/1981  . Smokeless tobacco: Never Used     Comment: Smoked [phone], up to 2.5 ppd  . Alcohol use 0.0 oz/week     Comment: 2-3 beers every day 2%  . Drug use: No  . Sexual activity: Yes   Other Topics Concern  . None   Social History Narrative  . None    Family History  Problem Relation Age of Onset  . Cancer Mother        pancreatic  . Diabetes Mother   . Heart  disease Father        Rheumatic  . Cancer Father        ? stomach  . Cancer Sister        stomach  . Cancer Maternal Aunt        X 4; ? primary  . Heart disease Paternal Aunt   . Cancer Maternal Grandmother        cervical  . Colon cancer Unknown        Aunts  . Multiple sclerosis Daughter   . Hypertension Neg Hx   . Stroke Neg Hx     Review of Systems  Constitutional: Negative for chills and fever.  Respiratory: Positive for shortness of breath (with activity x 2 weeks). Negative for cough and wheezing.   Cardiovascular: Negative for chest pain, palpitations and leg swelling.  Neurological: Positive for light-headedness and headaches (mild). Negative for dizziness.       Objective:   Vitals:   03/13/17 1559  BP: (!) 142/82  Pulse: 83  Resp: 16  Temp: 97.7 F (36.5 C)  SpO2: 97%   Filed Weights   03/13/17 1559  Weight: 114 lb (51.7 kg)   Body mass index is 20.19 kg/m.  Wt Readings from Last 3 Encounters:  03/13/17 114 lb (51.7 kg)  02/02/17 116 lb (52.6 kg)  01/08/17 113 lb (51.3 kg)   BP Readings from Last 3 Encounters:  03/13/17 (!) 142/82  02/02/17 (!) 150/90  01/08/17 (!) 142/82      Physical Exam Constitutional: Appears  well-developed and well-nourished. No distress.  HENT:  Head: Normocephalic and atraumatic.  Neck: Neck supple. No tracheal deviation present. No thyromegaly present.  No cervical lymphadenopathy Cardiovascular: Normal rate, regular rhythm and normal heart sounds.   No murmur heard. No carotid bruit .  No edema Pulmonary/Chest: Effort normal and breath sounds normal. No respiratory distress. No has no wheezes. No rales.  Skin: Skin is warm and dry. Not diaphoretic.  unable to see rash aroudn mouth/nose due to makeup Psychiatric: Normal mood and affect. Behavior is normal.         Assessment & Plan:   See Problem List for Assessment and Plan of chronic medical problems.

## 2017-03-14 ENCOUNTER — Encounter: Payer: Self-pay | Admitting: Internal Medicine

## 2017-03-14 DIAGNOSIS — R0600 Dyspnea, unspecified: Secondary | ICD-10-CM | POA: Insufficient documentation

## 2017-03-14 DIAGNOSIS — R0609 Other forms of dyspnea: Principal | ICD-10-CM | POA: Insufficient documentation

## 2017-03-14 NOTE — Assessment & Plan Note (Signed)
Perioral and nasal ? Related to filler placed in skin after eye surgery On valtrex, but not likely herpes - advised to consider stopping valtrex Has tried hydrocortisone cream and topical antibiotic - neither has helped ? Reaction to fillers

## 2017-03-14 NOTE — Assessment & Plan Note (Signed)
Would prefer not to change fruit intake or drinking cherry juice - will d/c ramipril and try a different medication Will recheck bmp once medication is settled - potassium only minimally elevated

## 2017-03-14 NOTE — Assessment & Plan Note (Signed)
Has been slightly elevated in the past several months Given high potassium will try to d/c ramipril and try a different medication - bystolic if covered by insurance She will monitor BP at home

## 2017-03-14 NOTE — Assessment & Plan Note (Signed)
Two weeks of DOE with day to day activities - does not exercise regularly EKG today - NSR at 79 bpm, normal EKG  - no change compared to 09/2016 Will refer to cardiology for possible stress test

## 2017-03-18 ENCOUNTER — Encounter: Payer: Self-pay | Admitting: Internal Medicine

## 2017-03-25 ENCOUNTER — Ambulatory Visit (INDEPENDENT_AMBULATORY_CARE_PROVIDER_SITE_OTHER)
Admission: RE | Admit: 2017-03-25 | Discharge: 2017-03-25 | Disposition: A | Discharge status: Home / Self Care | Payer: Medicare Other | Source: Ambulatory Visit | Attending: Internal Medicine | Admitting: Internal Medicine

## 2017-03-25 ENCOUNTER — Encounter: Payer: Self-pay | Admitting: Internal Medicine

## 2017-03-25 ENCOUNTER — Ambulatory Visit (INDEPENDENT_AMBULATORY_CARE_PROVIDER_SITE_OTHER): Payer: Medicare Other | Admitting: Internal Medicine

## 2017-03-25 VITALS — BP 130/72 | HR 65 | Temp 98.0°F | Resp 16 | Wt 115.0 lb

## 2017-03-25 DIAGNOSIS — R079 Chest pain, unspecified: Secondary | ICD-10-CM | POA: Diagnosis not present

## 2017-03-25 DIAGNOSIS — M549 Dorsalgia, unspecified: Secondary | ICD-10-CM

## 2017-03-25 DIAGNOSIS — R0789 Other chest pain: Secondary | ICD-10-CM | POA: Diagnosis not present

## 2017-03-25 MED ORDER — GABAPENTIN 100 MG PO CAPS: 100.0000 mg | ORAL_CAPSULE | Freq: Every day | ORAL | 3 refills | Status: DC

## 2017-03-25 NOTE — Patient Instructions (Addendum)
Have xrays done today.  Test(s) ordered today. Your results will be released to [facility] (or called to you) after review, usually within 72hours after test completion. If any changes need to be made, you will be notified at that same time.   Medications reviewed and updated.  Start gabapentin 100 mg at night.   Your prescription(s) have been submitted to your pharmacy. Please take as directed and contact our office if you believe you are having problem(s) with the medication(s).
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
## 2017-03-26 ENCOUNTER — Encounter: Payer: Self-pay | Admitting: Internal Medicine

## 2017-03-26 DIAGNOSIS — M549 Dorsalgia, unspecified: Secondary | ICD-10-CM | POA: Insufficient documentation

## 2017-03-26 NOTE — Assessment & Plan Note (Signed)
No rash  - unlikely shingles Pain is not confined to the breast and is more of the chest wall that is painful, not the breast Recent mammo was normal so we will hold off on breast imaging Check above xrays  - to rule out thoracic fracture May need MRI, to see neuro  Gabapentin 100 mg nightly

## 2017-03-26 NOTE — Assessment & Plan Note (Signed)
On left side only - no rash Likely musculoskeletal  - ? Radiculopathy from thoracic spine Will check thoracic xrays and rib/chest xray Given history of spine issues - will likely need to see back specialist

## 2017-03-27 ENCOUNTER — Encounter: Payer: Self-pay | Admitting: Interventional Cardiology

## 2017-04-08 ENCOUNTER — Telehealth: Payer: Self-pay | Admitting: Internal Medicine

## 2017-04-08 MED ORDER — CARVEDILOL 3.125 MG PO TABS: 3.1250 mg | ORAL_TABLET | Freq: Two times a day (BID) | ORAL | 3 refills | Status: DC

## 2017-04-08 NOTE — Telephone Encounter (Signed)
Probably the bystolic.  We can try coreg but it is twice a day   rx pending if she wants to try that.  It is change so it is hard to know what dose to start her on - she should monitor her BP

## 2017-04-08 NOTE — Telephone Encounter (Signed)
Spoke with pt to inform. RX sent to [facility]

## 2017-04-08 NOTE — Telephone Encounter (Signed)
Pt needs refills on her BP med. She said that [facility] on [address] wants to know if it can be changed to something different that would be cheaper for her. She did not know the names of the medications.

## 2017-04-09 ENCOUNTER — Ambulatory Visit: Payer: Medicare Other | Admitting: Interventional Cardiology

## 2017-04-10 ENCOUNTER — Ambulatory Visit: Payer: Medicare Other | Admitting: Cardiovascular Disease

## 2017-04-10 ENCOUNTER — Encounter: Payer: Self-pay | Admitting: Cardiovascular Disease

## 2017-04-10 VITALS — BP 132/64 | HR 60 | Ht 63.0 in | Wt 115.0 lb

## 2017-04-10 DIAGNOSIS — M069 Rheumatoid arthritis, unspecified: Secondary | ICD-10-CM

## 2017-04-10 DIAGNOSIS — R079 Chest pain, unspecified: Secondary | ICD-10-CM

## 2017-04-10 DIAGNOSIS — R0609 Other forms of dyspnea: Secondary | ICD-10-CM

## 2017-04-10 DIAGNOSIS — G35 Multiple sclerosis: Secondary | ICD-10-CM
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
## 2017-04-10 NOTE — Patient Instructions (Signed)
Medication Instructions:  Your physician recommends that you continue on your current medications as directed. Please refer to the Current Medication list given to you today.   Labwork: NONE ORDERED TODAY  Testing/Procedures: 1. Your physician has requested that you have an echocardiogram. Echocardiography is a painless test that uses sound waves to create images of your heart. It provides your doctor with information about the size and shape of your heart and how well your heart's chambers and valves are working. This procedure takes approximately one hour. There are no restrictions for this procedure.    Follow-Up: AS NEEDED WITH DR. Acie Fredrickson PENDING TEST RESULTS  Any Other Special Instructions Will Be Listed Below (If Applicable).     If you need a refill on your cardiac medications before your next appointment, please call your pharmacy.

## 2017-04-14 ENCOUNTER — Telehealth: Payer: Self-pay | Admitting: Rheumatology

## 2017-04-14 NOTE — Telephone Encounter (Signed)
Patient calling because she is out of Xeljanx, and would like a sample. Please call to advise.

## 2017-04-14 NOTE — Telephone Encounter (Signed)
Attempted to contact the patient and left message for patient to call the office.  

## 2017-04-15 NOTE — Telephone Encounter (Signed)
Patient advised she may come by the office to pick up a sample.

## 2017-04-15 NOTE — Telephone Encounter (Signed)
Medication Samples have been provided to the patient.  Drug name: [name]      Strength: 11 mg       Qty: 1  LOT: [id number]  Exp.Date: 11/19  Dosing instructions: Take one tablet by mouth daily  The patient has been instructed regarding the correct time, dose, and frequency of taking this medication, including desired effects and most common side effects.   Gwenlyn Perking 4:39 PM 04/15/2017

## 2017-04-18 ENCOUNTER — Other Ambulatory Visit: Payer: Self-pay | Admitting: Rheumatology

## 2017-04-20 ENCOUNTER — Other Ambulatory Visit: Payer: Self-pay

## 2017-04-20 ENCOUNTER — Ambulatory Visit (HOSPITAL_COMMUNITY): Payer: Medicare Other | Attending: Cardiovascular Disease

## 2017-04-20 DIAGNOSIS — I10 Essential (primary) hypertension: Secondary | ICD-10-CM | POA: Insufficient documentation

## 2017-04-20 DIAGNOSIS — E785 Hyperlipidemia, unspecified: Secondary | ICD-10-CM | POA: Insufficient documentation

## 2017-04-20 DIAGNOSIS — Z7289 Other problems related to lifestyle: Secondary | ICD-10-CM | POA: Diagnosis not present

## 2017-04-20 DIAGNOSIS — Z90710 Acquired absence of both cervix and uterus: Secondary | ICD-10-CM | POA: Diagnosis not present

## 2017-04-20 DIAGNOSIS — Z87891 Personal history of nicotine dependence: Secondary | ICD-10-CM | POA: Insufficient documentation

## 2017-04-20 DIAGNOSIS — M069 Rheumatoid arthritis, unspecified: Secondary | ICD-10-CM | POA: Insufficient documentation

## 2017-04-20 DIAGNOSIS — R0609 Other forms of dyspnea: Secondary | ICD-10-CM | POA: Insufficient documentation

## 2017-04-20 DIAGNOSIS — I081 Rheumatic disorders of both mitral and tricuspid valves: Secondary | ICD-10-CM | POA: Diagnosis not present

## 2017-04-20 DIAGNOSIS — G35 Multiple sclerosis: Secondary | ICD-10-CM | POA: Diagnosis not present

## 2017-04-20 DIAGNOSIS — R079 Chest pain, unspecified: Secondary | ICD-10-CM | POA: Diagnosis not present

## 2017-04-20 DIAGNOSIS — Z8249 Family history of ischemic heart disease and other diseases of the circulatory system: Secondary | ICD-10-CM | POA: Insufficient documentation

## 2017-04-20 DIAGNOSIS — I371 Nonrheumatic pulmonary valve insufficiency: Secondary | ICD-10-CM | POA: Diagnosis not present

## 2017-04-20 NOTE — Telephone Encounter (Signed)
Last visit: 12/19/2016 Next visit: 06/02/2017  Okay to refill per Dr. Estanislado Pandy.

## 2017-04-23 NOTE — Progress Notes (Signed)
Subjective:    Patient ID: Sherri [name], female    DOB: 26-Jun-1940, 76 y.o.   MRN: [id number]  HPI The patient is here for follow up.  Hypertension: She is taking her medication daily. She is compliant with a low sodium diet.  She denies chest pain, palpitations, edema, shortness of breath and regular headaches. She is exercising regularly.      Hyperlipidemia: She is taking her medication daily. She is compliant with a low fat/cholesterol diet. She is exercising regularly. She denies myalgias.   Anxiety, stress: She has been experiencing increased stress and anxiety related to her husband having increased memory issues.  He does not feel this is so and it is causing issues between them and she is concerned about him handling the finances, etc.  Since starting the gabapentin at night she has not been taking the Valium at night.  She wonders if she can take this for some of the anxiety.  She does not want to go on an antidepressant.  Multiple sclerosis leg jerks, thoracic neuralgia/chest pain: She has been taking the gabapentin at night and this has helped.  Her left breast is still tender, but that is not new.  She has been sleeping well at night and is happy with the gabapentin.    Medications and allergies reviewed with patient and updated if appropriate.  Patient Active Problem List   Diagnosis Date Noted  . Mid back pain 03/26/2017  . Left sided chest pain 03/25/2017  . Dyspnea on exertion 03/14/2017  . Hyperkalemia 03/13/2017  . Rash and nonspecific skin eruption 01/08/2017  . Submandibular gland swelling 01/08/2017  . High risk medication use 09/02/2016  . Urinary frequency 09/02/2016  . Piriformis muscle pain 09/02/2016  . Fatigue 07/22/2016  . Hair loss 07/22/2016  . Right hip pain 04/08/2016  . Right knee pain 02/25/2016  . Dyspepsia 04/05/2015  . Numbness 11/14/2014  . Biceps tendon tear 10/10/2014  . Insomnia 10/10/2014  . Gait disturbance 06/13/2014  .  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 OP (osteoporosis) 01/18/2014  . Other long term (current) drug therapy 01/18/2014  . Other dysphagia 12/29/2013  . Hoarseness 12/29/2013  . 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 Personal history of colonic polyps 07/03/2010  . Nocturia 05/28/2010  . THYROID NODULE 05/17/2008  . Multiple sclerosis ([place]) 05/17/2008  . Chronic fatigue 03/30/2007  . Hyperlipidemia 08/20/2006  . Essential hypertension 08/20/2006  . Rheumatoid arthritis ([place]) 08/20/2006  . CERVICAL CANCER, HX OF 08/20/2006  . GASTRIC ULCER, HX OF 08/20/2006    Current Outpatient Medications on File Prior to Visit  Medication Sig Dispense Refill  . atorvastatin (LIPITOR) 10 MG tablet TAKE 1 TABLET BY MOUTH ON MONDAYS AND FRIDAY 27 tablet 2  . Biotin 5000 MCG TABS Take 5,000 mcg by mouth daily.     . Calcium Carbonate-Vitamin D (CALCIUM + D PO) Take 1 tablet by mouth daily.     . Carboxymethylcellul-Glycerin (LUBRICATING EYE DROPS OP) Apply 1 drop to eye daily as needed (dry eyes).    . carvedilol (COREG) 3.125 MG tablet Take 1 tablet (3.125 mg total) 2 (two) times daily with a meal by mouth. 60 tablet 3  . Cholecalciferol (VITAMIN D3) 2000 units capsule Take 2,000 Units by mouth daily.    . Cyanocobalamin (VITAMIN B-12 PO) Take 1 tablet by mouth daily.    . diazepam (VALIUM) 5 MG tablet Take 5 mg at bedtime by mouth.     [name]  diclofenac sodium (VOLTAREN) 1 % GEL APPLY 3 GRAMS TO 3 LARGE JOINTS 3 TIMES A DAY AS NEEDED 782 g 3  . folic acid (FOLVITE) 1 MG tablet TAKE 1 TABLET BY MOUTH EVERY DAY 90 tablet 4  . furosemide (LASIX) 20 MG tablet TAKE 1 TABLET BY MOUTH EVERY DAY 90 tablet 0  . gabapentin (NEURONTIN) 100 MG capsule Take 1 capsule (100 mg total) by mouth at bedtime. 30 capsule 3  . methotrexate (RHEUMATREX) 2.5 MG tablet TAKE 4 TABLETS (10 MG TOTAL) BY MOUTH ONCE A WEEK. CAUTION:CHEMOTHERAPY. PROTECT FROM LIGHT. 48 tablet 0  . Oxycodone HCl 10 MG TABS Take 1 tablet  (10 mg total) by mouth 3 (three) times daily as needed. 90 tablet 0  . Pyridoxine HCl (VITAMIN B-6 PO) Take 1 tablet by mouth daily.    [name] tiZANidine (ZANAFLEX) 4 MG capsule Take 4 mg by mouth 3 (three) times daily as needed for muscle spasms.    . traMADol (ULTRAM) 50 MG tablet Take 50 mg every 6 (six) hours as needed by mouth for severe pain.    . predniSONE (DELTASONE) 1 MG tablet Take 4 tablets (4 mg total) by mouth daily with breakfast. 360 tablet 1  . Tofacitinib Citrate (XELJANZ XR) 11 MG TB24 Take 11 mg by mouth daily. 60 tablet 0   No current facility-administered medications on file prior to visit.     Past Medical History:  Diagnosis Date  . Anxiety    takes Valium daily as needed  . Bruises easily    d/t meds  . Cancer ([place])    basal cell ca, in situ- uterine   . Cataracts, bilateral   . Chronic back pain   . Dizziness    r/t to meds  . GERD (gastroesophageal reflux disease)    no meds on a regular basis but will take Tums if needed  . Headache(784.0)    r/t neck issues  . History of bronchitis 6-19yrs ago  . History of colon polyps    benign  . Hyperlipidemia    takes Atorvastatin on Mondays and Fridays  . Hypertension    takes Ramipril daily  . Joint pain   . Joint swelling   . Multiple sclerosis ([place])   . Neuromuscular disorder ([place])    Dr. Park Liter- [facility], follows M.S.  . Nocturia   . OA (osteoarthritis)    Dr Jani Gravel weekly, RA- hands- knees- feet   . PONV (postoperative nausea and vomiting)    trouble urinating after surgery in 2014  . Rheumatoid arthritis(714.0)    Dr Estanislado Pandy takes [name] daily  . Skin cancer   . Urinary retention    sees Dr.Wrenn about 2 times a yr    Past Surgical History:  Procedure Laterality Date  . ABDOMINAL HYSTERECTOMY     1985  . APPENDECTOMY     with TAH  . BACK SURGERY     several  . BROW LIFT Bilateral 10/02/2016   Procedure: BILATERAL LOWER LID BLEPHAROPLASTY;  Surgeon: Clista Bernhardt, MD;  Location: [facility];  Service: Plastics;  Laterality: Bilateral;  . CARPAL TUNNEL RELEASE Right   . cataracts    . CERVICAL FUSION     Dr Joya Salm  . CERVICAL FUSION  12/31/2012   Dr Joya Salm  . CHEST TUBE INSERTION     for traumatic Pneumothorax  . COLONOSCOPY  07/16/2010   normal   . COLONOSCOPY W/ POLYPECTOMY  1997   negative since; Dr Deatra Ina  .  ESOPHAGOGASTRODUODENOSCOPY  07/16/2010   normal  . eye lid raise    . EYE SURGERY Bilateral    cataracts removed - /w IOL  . LUMBAR FUSION     Dr Joya Salm  . NASAL SINUS SURGERY    . POSTERIOR CERVICAL FUSION/FORAMINOTOMY N/A 12/31/2012   Procedure: POSTERIOR LATERAL CERVICAL FUSION/FORAMINOTOMY LEVEL 1 CERVICAL THREE-FOUR WITH LATERAL MASS SCREWS;  Surgeon: Floyce Stakes, MD;  Location: [facility];  Service: Neurosurgery;  Laterality: N/A;  . PTOSIS REPAIR Bilateral 10/02/2016   Procedure: INTERNAL PTOSIS REPAIR;  Surgeon: Clista Bernhardt, MD;  Location: [facility];  Service: Plastics;  Laterality: Bilateral;  . SKIN BIOPSY    . THYROID SURGERY     R lobe removed- 1972, has grown back - CT last done- 2018  . TOTAL HIP ARTHROPLASTY  12/22/2011   Procedure: TOTAL HIP ARTHROPLASTY;  Surgeon: Kerin Salen, MD;  Location: [facility];  Service: Orthopedics;  Laterality: Left;  . TUBAL LIGATION      Social History   Socioeconomic History  . Marital status: Married    Spouse name: None  . Number of children: None  . Years of education: None  . Highest education level: None  Social Needs  . Financial resource strain: None  . Food insecurity - worry: None  . Food insecurity - inability: None  . Transportation needs - medical: None  . Transportation needs - non-medical: None  Occupational History  . Occupation: retired  Tobacco Use  . Smoking status: Former Smoker    Last attempt to quit: 05/26/1981    Years since quitting: 35.9  . Smokeless tobacco: Never Used  . Tobacco comment: Smoked [phone], up to 2.5 ppd  Substance and Sexual  Activity  . Alcohol use: Yes    Alcohol/week: 0.0 oz    Comment: 2-3 beers every day 2%  . Drug use: No  . Sexual activity: Yes  Other Topics Concern  . None  Social History Narrative  . None    Family History  Problem Relation Age of Onset  . Cancer Mother        pancreatic  . Diabetes Mother   . Heart disease Father        Rheumatic  . Cancer Father        ? stomach  . Cancer Sister        stomach  . Cancer Maternal Aunt        X 4; ? primary  . Heart disease Paternal Aunt   . Cancer Maternal Grandmother        cervical  . Colon cancer Unknown        Aunts  . Multiple sclerosis Daughter   . Hypertension Neg Hx   . Stroke Neg Hx     Review of Systems  Constitutional: Negative for chills and fever.  Respiratory: Negative for cough, shortness of breath and wheezing.   Cardiovascular: Positive for palpitations (with anxiety). Negative for chest pain and leg swelling.  Neurological: Negative for light-headedness and headaches.       Objective:   Vitals:   04/24/17 1538  BP: (!) 154/80  Pulse: 68  Resp: 16  Temp: 98 F (36.7 C)  SpO2: 95%   Wt Readings from Last 3 Encounters:  04/24/17 113 lb (51.3 kg)  04/10/17 115 lb (52.2 kg)  03/25/17 115 lb (52.2 kg)   Body mass index is 20.02 kg/m.   Physical Exam    Constitutional: Appears well-developed and well-nourished.  No distress.  HENT:  Head: Normocephalic and atraumatic.  Neck: Neck supple. No tracheal deviation present. No thyromegaly present.  No cervical lymphadenopathy Cardiovascular: Normal rate, regular rhythm and normal heart sounds.   No murmur heard. No carotid bruit .  No edema Pulmonary/Chest: Effort normal and breath sounds normal. No respiratory distress. No has no wheezes. No rales.  Skin: Skin is warm and dry. Not diaphoretic.  Psychiatric: mildly anxious mood and affect. Behavior is normal.      Assessment & Plan:    See Problem List for Assessment and Plan of chronic medical  problems.

## 2017-04-24 ENCOUNTER — Other Ambulatory Visit (INDEPENDENT_AMBULATORY_CARE_PROVIDER_SITE_OTHER): Payer: Medicare Other

## 2017-04-24 ENCOUNTER — Encounter: Payer: Self-pay | Admitting: Internal Medicine

## 2017-04-24 ENCOUNTER — Ambulatory Visit (INDEPENDENT_AMBULATORY_CARE_PROVIDER_SITE_OTHER): Payer: Medicare Other | Admitting: Internal Medicine

## 2017-04-24 VITALS — BP 154/80 | HR 68 | Temp 98.0°F | Resp 16 | Wt 113.0 lb

## 2017-04-24 DIAGNOSIS — E7849 Other hyperlipidemia: Secondary | ICD-10-CM

## 2017-04-24 DIAGNOSIS — I10 Essential (primary) hypertension: Secondary | ICD-10-CM

## 2017-04-24 DIAGNOSIS — F419 Anxiety disorder, unspecified: Secondary | ICD-10-CM | POA: Insufficient documentation

## 2017-04-24 LAB — COMPREHENSIVE METABOLIC PANEL
ALT: 25 U/L (ref 0–35)
AST: 35 U/L (ref 0–37)
Albumin: 4.1 g/dL (ref 3.5–5.2)
Alkaline Phosphatase: 48 U/L (ref 39–117)
BUN: 10 mg/dL (ref 6–23)
CO2: 33 meq/L — AB (ref 19–32)
Calcium: 10 mg/dL (ref 8.4–10.5)
Chloride: 99 mEq/L (ref 96–112)
Creatinine, Ser: 0.85 mg/dL (ref 0.40–1.20)
GFR: 69.27 mL/min (ref >60.00)
GLUCOSE: 103 mg/dL — AB (ref 70–99)
POTASSIUM: 3.9 meq/L (ref 3.5–5.1)
Sodium: 137 mEq/L (ref 135–145)
Total Bilirubin: 0.6 mg/dL (ref 0.2–1.2)
Total Protein: 6.8 g/dL (ref 6.0–8.3)

## 2017-04-24 LAB — LIPID PANEL
CHOL/HDL RATIO: 3
Cholesterol: 185 mg/dL (ref 0–200)
HDL: 72 mg/dL (ref >39.00)
LDL Cholesterol: 98 mg/dL (ref 0–99)
NONHDL: 112.96
Triglycerides: 75 mg/dL (ref 0.0–149.0)
VLDL: 15 mg/dL (ref 0.0–40.0)

## 2017-04-24 MED ORDER — CARVEDILOL 6.25 MG PO TABS: 6.2500 mg | ORAL_TABLET | Freq: Two times a day (BID) | ORAL | 3 refills | Status: DC

## 2017-04-24 NOTE — Patient Instructions (Addendum)
  Test(s) ordered today. Your results will be released to [place] (or called to you) after review, usually within 72hours after test completion. If any changes need to be made, you will be notified at that same time.   Medications reviewed and updated.  Changes include taking valium 2.5 mg twice daily for anxiety and 5 mg at night.   Increase the coreg to 6.25 mg daily.    Your prescription(s) have been submitted to your pharmacy. Please take as directed and contact our office if you believe you are having problem(s) with the medication(s).   Please followup in 6 months

## 2017-04-25 ENCOUNTER — Encounter: Payer: Self-pay | Admitting: Internal Medicine

## 2017-04-25 NOTE — Assessment & Plan Note (Signed)
Check lipid panel  Continue daily statin Regular exercise and healthy diet encouraged  

## 2017-04-25 NOTE — Assessment & Plan Note (Signed)
Increased anxiety and stress recently-related to her husband's memory issues She has not been taking her Valium at night and we discussed options She does not want to be on a daily antidepressant She will start taking the Valium again and see if that helps with some of her anxiety-may take 2.5 mg in the morning, 2.5 mg in the afternoon and possibly 5 mg at night-she will need to titrate the medication, but understands to take the minimal amount that is effective If she takes the Valium at night she will not take the OxyContin-she is hoping that she can discontinue that by taking the Valium at night

## 2017-04-25 NOTE — Assessment & Plan Note (Signed)
Elevated here today but she feels this is related to her current stressful situation Continue to monitor No change in medication CMP

## 2017-04-27 ENCOUNTER — Telehealth: Payer: Self-pay | Admitting: Neurology

## 2017-04-27 MED ORDER — OXYCODONE HCL 10 MG PO TABS: 10.0000 mg | ORAL_TABLET | Freq: Three times a day (TID) | ORAL | 0 refills | Status: DC | PRN

## 2017-04-27 NOTE — Telephone Encounter (Signed)
Rx. up front [facility] 

## 2017-04-27 NOTE — Telephone Encounter (Signed)
Rx. awaiting RAS sig/fim 

## 2017-04-27 NOTE — Telephone Encounter (Signed)
Pt request refill for Oxycodone HCl 10 MG TABS

## 2017-04-28 ENCOUNTER — Encounter: Payer: Self-pay | Admitting: Internal Medicine

## 2017-04-30 ENCOUNTER — Other Ambulatory Visit: Payer: Self-pay | Admitting: Internal Medicine

## 2017-04-30 ENCOUNTER — Telehealth: Payer: Self-pay | Admitting: Rheumatology

## 2017-04-30 DIAGNOSIS — N3010 Interstitial cystitis (chronic) without hematuria: Secondary | ICD-10-CM | POA: Diagnosis not present

## 2017-04-30 NOTE — Telephone Encounter (Addendum)
Patient calling in reference to a bad flare In both wrists, and ankles. Patient has had to increase tramadol due to this. (3 tabs per day). Patient wants to know if she needs to increase her Prednisone also. Please call to inform. Patient will be in an appt from 2:45 TO 4PM today.

## 2017-05-01 ENCOUNTER — Ambulatory Visit (INDEPENDENT_AMBULATORY_CARE_PROVIDER_SITE_OTHER): Payer: Self-pay

## 2017-05-01 ENCOUNTER — Ambulatory Visit: Payer: Medicare Other | Admitting: Rheumatology

## 2017-05-01 ENCOUNTER — Encounter: Payer: Self-pay | Admitting: Rheumatology

## 2017-05-01 VITALS — BP 118/71 | HR 66 | Resp 15 | Ht 63.0 in | Wt 115.0 lb

## 2017-05-01 DIAGNOSIS — Z79899 Other long term (current) drug therapy: Secondary | ICD-10-CM | POA: Diagnosis not present

## 2017-05-01 DIAGNOSIS — M25572 Pain in left ankle and joints of left foot: Secondary | ICD-10-CM

## 2017-05-01 DIAGNOSIS — Z96642 Presence of left artificial hip joint: Secondary | ICD-10-CM

## 2017-05-01 DIAGNOSIS — M4312 Spondylolisthesis, cervical region: Secondary | ICD-10-CM

## 2017-05-01 DIAGNOSIS — M79642 Pain in left hand: Secondary | ICD-10-CM | POA: Diagnosis not present

## 2017-05-01 DIAGNOSIS — Z8659 Personal history of other mental and behavioral disorders: Secondary | ICD-10-CM | POA: Diagnosis not present

## 2017-05-01 DIAGNOSIS — M25571 Pain in right ankle and joints of right foot: Secondary | ICD-10-CM | POA: Diagnosis not present

## 2017-05-01 DIAGNOSIS — M7061 Trochanteric bursitis, right hip: Secondary | ICD-10-CM

## 2017-05-01 DIAGNOSIS — M79641 Pain in right hand: Secondary | ICD-10-CM

## 2017-05-01 DIAGNOSIS — G35D Multiple sclerosis, unspecified: Secondary | ICD-10-CM

## 2017-05-01 DIAGNOSIS — M79672 Pain in left foot: Secondary | ICD-10-CM | POA: Diagnosis not present

## 2017-05-01 DIAGNOSIS — M810 Age-related osteoporosis without current pathological fracture: Secondary | ICD-10-CM

## 2017-05-01 DIAGNOSIS — Z8639 Personal history of other endocrine, nutritional and metabolic disease: Secondary | ICD-10-CM

## 2017-05-01 DIAGNOSIS — Z8679 Personal history of other diseases of the circulatory system: Secondary | ICD-10-CM

## 2017-05-01 DIAGNOSIS — M5030 Other cervical disc degeneration, unspecified cervical region: Secondary | ICD-10-CM | POA: Diagnosis not present

## 2017-05-01 DIAGNOSIS — M1611 Unilateral primary osteoarthritis, right hip: Secondary | ICD-10-CM

## 2017-05-01 DIAGNOSIS — M79671 Pain in right foot: Secondary | ICD-10-CM | POA: Diagnosis not present

## 2017-05-01 DIAGNOSIS — M0579 Rheumatoid arthritis with rheumatoid factor of multiple sites without organ or systems involvement: Secondary | ICD-10-CM | POA: Diagnosis not present

## 2017-05-01 DIAGNOSIS — G35 Multiple sclerosis: Secondary | ICD-10-CM

## 2017-05-01 MED ORDER — PREDNISONE 5 MG PO TABS: ORAL_TABLET | ORAL | 0 refills | Status: DC

## 2017-05-01 MED ORDER — LIDOCAINE HCL 1 % IJ SOLN
1.5000 mL | INTRAMUSCULAR | Status: AC | PRN
  Administered 2017-05-01: 1.5 mL

## 2017-05-01 MED ORDER — TRIAMCINOLONE ACETONIDE 40 MG/ML IJ SUSP
40.0000 mg | INTRAMUSCULAR | Status: AC | PRN
  Administered 2017-05-01: 40 mg via INTRA_ARTICULAR

## 2017-05-01 NOTE — Progress Notes (Signed)
Office Visit Note  Patient: Sherri [name]             Date of Birth: [birth year]           MRN: [id number]             PCP: Binnie Rail, MD Referring: Binnie Rail, MD Visit Date: 05/01/2017 Occupation: @GUAROCC @    Subjective:  Other (BIL foot, ankle, hip, and hand pain )   History of Present Illness: Sherri [name] is a 76 y.o. female  with history of sero positive rheumatoid arthritis. She states she had been doing fine until recently. About 2 weeks ago she started having pain and swelling in her joints. She states the symptoms gradually got worse to the point she is having difficulty walking. She describes pain and swelling in her bilateral hands, bilateral ankles and bilateral feet. She denies any increased activity causing the flare. She also has pain in her bilateral hips.  Activities of Daily Living:  Patient reports morning stiffness for all day hours.   Patient Reports nocturnal pain.  Difficulty dressing/grooming: Reports Difficulty climbing stairs: Reports Difficulty getting out of chair: Reports Difficulty using hands for taps, buttons, cutlery, and/or writing: Reports   Review of Systems  Constitutional: Positive for fatigue. Negative for night sweats, weight gain, weight loss and weakness.  HENT: Positive for mouth dryness. Negative for mouth sores, trouble swallowing, trouble swallowing and nose dryness.   Eyes: Positive for dryness. Negative for pain, redness and visual disturbance.  Respiratory: Negative for cough, shortness of breath and difficulty breathing.   Cardiovascular: Positive for hypertension. Negative for chest pain, palpitations, irregular heartbeat and swelling in legs/feet.  Gastrointestinal: Negative for blood in stool, constipation and diarrhea.  Endocrine: Negative for increased urination.  Genitourinary: Negative for vaginal dryness.  Musculoskeletal: Positive for arthralgias, joint pain, joint swelling and morning stiffness.  Negative for myalgias, muscle weakness, muscle tenderness and myalgias.  Skin: Negative for color change, rash, hair loss, skin tightness, ulcers and sensitivity to sunlight.  Allergic/Immunologic: Negative for susceptible to infections.  Neurological: Negative for dizziness, memory loss and night sweats.  Hematological: Negative for swollen glands.  Psychiatric/Behavioral: Positive for depressed mood and sleep disturbance. The patient is not nervous/anxious.     PMFS History:  Patient Active Problem List   Diagnosis Date Noted  . Anxiety 04/24/2017  . Mid back pain 03/26/2017  . Left sided chest pain 03/25/2017  . Dyspnea on exertion 03/14/2017  . Hyperkalemia 03/13/2017  . Rash and nonspecific skin eruption 01/08/2017  . Submandibular gland swelling 01/08/2017  . High risk medication use 09/02/2016  . Urinary frequency 09/02/2016  . Piriformis muscle pain 09/02/2016  . Fatigue 07/22/2016  . Hair loss 07/22/2016  . Right hip pain 04/08/2016  . Right knee pain 02/25/2016  . Dyspepsia 04/05/2015  . Numbness 11/14/2014  . Biceps tendon tear 10/10/2014  . Insomnia 10/10/2014  . Gait disturbance 06/13/2014  . 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 OP (osteoporosis) 01/18/2014  . Other long term (current) drug therapy 01/18/2014  . Other dysphagia 12/29/2013  . Hoarseness 12/29/2013  . 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 Personal history of colonic polyps 07/03/2010  . Nocturia 05/28/2010  . THYROID NODULE 05/17/2008  . Multiple sclerosis ([place]) 05/17/2008  . Chronic fatigue 03/30/2007  . Hyperlipidemia 08/20/2006  . Essential hypertension 08/20/2006  . Rheumatoid arthritis ([place]) 08/20/2006  . CERVICAL CANCER, HX OF 08/20/2006  . GASTRIC ULCER, HX OF 08/20/2006  Past Medical History:  Diagnosis Date  . Anxiety    takes Valium daily as needed  . Bruises easily    d/t meds  . Cancer ([place])    basal cell ca, in situ- uterine   . Cataracts, bilateral     . Chronic back pain   . Dizziness    r/t to meds  . GERD (gastroesophageal reflux disease)    no meds on a regular basis but will take Tums if needed  . Headache(784.0)    r/t neck issues  . History of bronchitis 6-39yrs ago  . History of colon polyps    benign  . Hyperlipidemia    takes Atorvastatin on Mondays and Fridays  . Hypertension    takes Ramipril daily  . Joint pain   . Joint swelling   . Multiple sclerosis ([place])   . Neuromuscular disorder ([place])    Dr. Park Liter- [facility], follows M.S.  . Nocturia   . OA (osteoarthritis)    Dr Jani Gravel weekly, RA- hands- knees- feet   . PONV (postoperative nausea and vomiting)    trouble urinating after surgery in 2014  . Rheumatoid arthritis(714.0)    Dr Estanislado Pandy takes [name] daily  . Skin cancer   . Urinary retention    sees Dr.Wrenn about 2 times a yr    Family History  Problem Relation Age of Onset  . Cancer Mother        pancreatic  . Diabetes Mother   . Heart disease Father        Rheumatic  . Cancer Father        ? stomach  . Cancer Sister        stomach  . Cancer Maternal Aunt        X 4; ? primary  . Heart disease Paternal Aunt   . Cancer Maternal Grandmother        cervical  . Colon cancer Unknown        Aunts  . Multiple sclerosis Daughter   . Hypertension Neg Hx   . Stroke Neg Hx    Past Surgical History:  Procedure Laterality Date  . ABDOMINAL HYSTERECTOMY     1985  . APPENDECTOMY     with TAH  . BACK SURGERY     several  . BROW LIFT Bilateral 10/02/2016   Procedure: BILATERAL LOWER LID BLEPHAROPLASTY;  Surgeon: Clista Bernhardt, MD;  Location: [facility];  Service: Plastics;  Laterality: Bilateral;  . CARPAL TUNNEL RELEASE Right   . cataracts    . CERVICAL FUSION     Dr Joya Salm  . CERVICAL FUSION  12/31/2012   Dr Joya Salm  . CHEST TUBE INSERTION     for traumatic Pneumothorax  . COLONOSCOPY  07/16/2010   normal   . COLONOSCOPY W/ POLYPECTOMY  1997   negative since; Dr  Deatra Ina  . ESOPHAGOGASTRODUODENOSCOPY  07/16/2010   normal  . eye lid raise    . EYE SURGERY Bilateral    cataracts removed - /w IOL  . LUMBAR FUSION     Dr Joya Salm  . NASAL SINUS SURGERY    . POSTERIOR CERVICAL FUSION/FORAMINOTOMY N/A 12/31/2012   Procedure: POSTERIOR LATERAL CERVICAL FUSION/FORAMINOTOMY LEVEL 1 CERVICAL THREE-FOUR WITH LATERAL MASS SCREWS;  Surgeon: Floyce Stakes, MD;  Location: [facility];  Service: Neurosurgery;  Laterality: N/A;  . PTOSIS REPAIR Bilateral 10/02/2016   Procedure: INTERNAL PTOSIS REPAIR;  Surgeon: Clista Bernhardt, MD;  Location: [facility];  Service:  Plastics;  Laterality: Bilateral;  . SKIN BIOPSY    . THYROID SURGERY     R lobe removed- 1972, has grown back - CT last done- 2018  . TOTAL HIP ARTHROPLASTY  12/22/2011   Procedure: TOTAL HIP ARTHROPLASTY;  Surgeon: Kerin Salen, MD;  Location: [facility];  Service: Orthopedics;  Laterality: Left;  . TUBAL LIGATION     Social History   Social History Narrative  . Not on file     Objective: Vital Signs: BP 118/71 (BP Location: Left Arm, Patient Position: Sitting, Cuff Size: Normal)   Pulse 66   Resp 15   Ht 5\' 3"  (1.6 m)   Wt 115 lb (52.2 kg)   BMI 20.37 kg/m    Physical Exam  Constitutional: She is oriented to person, place, and time. She appears well-developed and well-nourished.  HENT:  Head: Normocephalic and atraumatic.  Eyes: Conjunctivae and EOM are normal.  Neck: Normal range of motion.  Cardiovascular: Normal rate, regular rhythm, normal heart sounds and intact distal pulses.  Pulmonary/Chest: Effort normal and breath sounds normal.  Abdominal: Soft. Bowel sounds are normal.  Lymphadenopathy:    She has no cervical adenopathy.  Neurological: She is alert and oriented to person, place, and time.  Skin: Skin is warm and dry. Capillary refill takes less than 2 seconds.  Psychiatric: She has a normal mood and affect. Her behavior is normal.  Nursing note and vitals reviewed.     Musculoskeletal Exam: C-spine limited range of motion with discomfort. Thoracic and lumbar spine were good range of motion. She is painful range of motion of bilateral shoulder joints. Elbow joints are good range of motion. She has no synovitis over bilateral wrist joints. She synovitis in her bilateral hands and her feet as described below. She also had tenderness over her ankle joints. She had tenderness over right trochanteric bursa consistent with trochanteric bursitis. Her left total hip replacement is doing fairly well.  CDAI Exam: CDAI Homunculus Exam:   Tenderness:  RUE: glenohumeral and wrist LUE: glenohumeral Right hand: 2nd MCP, 3rd MCP, 4th PIP and 5th PIP Left hand: 2nd MCP, 3rd MCP, 2nd PIP, 3rd PIP and 4th PIP RLE: tibiotalar LLE: tibiotalar Right foot: 2nd MTP and 3rd MTP Left foot: 2nd MTP and 3rd MTP  Swelling:  Right hand: 3rd MCP, 4th PIP and 5th PIP Left hand: 3rd MCP and 4th PIP Right foot: 3rd MTP  Joint Counts:  CDAI Tender Joint count: 12 CDAI Swollen Joint count: 5  Global Assessments:  Patient Global Assessment: 8 Provider Global Assessment: 8  CDAI Calculated Score: 33   Investigation: No additional findings.TB Gold: 07/29/2016 Negative  CBC Latest Ref Rng & Units 03/11/2017 12/19/2016 09/30/2016  WBC 3.8 - 10.8 Thousand/uL 6.3 5.1 7.6  Hemoglobin 11.7 - 15.5 g/dL 14.0 13.3 13.9  Hematocrit 35.0 - 45.0 % 39.8 39.7 41.7  Platelets 140 - 400 Thousand/uL 312 326 272   CMP Latest Ref Rng & Units 04/24/2017 03/11/2017 12/19/2016  Glucose 70 - 99 mg/dL 103(H) 88 92  BUN 6 - 23 mg/dL 10 10 10   Creatinine 0.40 - 1.20 mg/dL 0.85 0.84 0.92  Sodium 135 - 145 mEq/L 137 137 137  Potassium 3.5 - 5.1 mEq/L 3.9 5.4(H) 5.0  Chloride 96 - 112 mEq/L 99 99 99  CO2 19 - 32 mEq/L 33(H) 31 26  Calcium 8.4 - 10.5 mg/dL 10.0 9.9 9.8  Total Protein 6.0 - 8.3 g/dL 6.8 6.7 6.8  Total Bilirubin 0.2 -  1.2 mg/dL 0.6 0.5 0.5  Alkaline Phos 39 - 117 U/L 48 - 47  AST 0 - 37  U/L 35 27 33  ALT 0 - 35 U/L 25 22 26     Imaging: Xr Ankle 2 Views Left  Result Date: 05/01/2017 Tibiotalar and subtalar joint space narrowing was noted. No erosive changes were noted. These findings are consistent with rheumatoid arthritis.  Xr Ankle 2 Views Right  Result Date: 05/01/2017 Tibiotalar and subtalar joint space narrowing was noted. No erosive changes were noted. These findings are consistent with rheumatoid arthritis.  Xr Foot 2 Views Left  Result Date: 05/01/2017 First MTP, all PIP and DIP narrowing was noted. No erosive changes were noted. Mild intertarsal joint space narrowing was noted. Impression: These findings are consistent with rheumatoid arthritis and osteoarthritis overlap.  Xr Foot 2 Views Right  Result Date: 05/01/2017 First MTP, all PIP and DIP narrowing was noted. No erosive changes were noted. Mild intertarsal joint space narrowing was noted. Impression: These findings are consistent with rheumatoid arthritis and osteoarthritis overlap.  Xr Hand 2 View Left  Result Date: 05/01/2017 Juxta articular osteopenia noted. First second and third MCP joint narrowing noted. All PIP/DIP joint narrowing and CMC joint narrowing was noted. Mild intercarpal and radiocarpal joint space narrowing was noted. No erosive changes were noted. Impression: These findings are consistent with osteoarthritis and rheumatoid arthritis overlap.  Xr Hand 2 View Right  Result Date: 05/01/2017 Juxta articular osteopenia was noted. Severe first second and third MCP narrowing with subluxation was noted. All PIP/DIP and CMC joint narrowing was noted. Intercarpal and radiocarpal joint space narrowing was noted. No erosive changes were noted. Impression: These findings are consistent with rheumatoid arthritis and osteoarthritis overlap.   Speciality Comments: No specialty comments available.    Procedures:  Large Joint Inj: R greater trochanter on 05/01/2017 12:15 PM Indications: pain and  diagnostic evaluation Details: 27 G 1.5 in needle, lateral approach  Arthrogram: No  Medications: 40 mg triamcinolone acetonide 40 MG/ML; 1.5 mL lidocaine 1 % Aspirate: 0 mL Outcome: tolerated well, no immediate complications Procedure, treatment alternatives, risks and benefits explained, specific risks discussed. Consent was given by the patient. Immediately prior to procedure a time out was called to verify the correct patient, procedure, equipment, support staff and site/side marked as required. Patient was prepped and draped in the usual sterile fashion.     Allergies: Darvon [propoxyphene hcl]; Meperidine hcl; Penicillins; Azathioprine; and Ezetimibe-simvastatin   Assessment / Plan:     Visit Diagnoses: Rheumatoid arthritis involving multiple sites with positive rheumatoid factor (HCC) - +RF, +CCP. She's been having increased pain and discomfort. She started having a flare about 2 weeks ago. She states she's under a lot of stress. She has to attend her grandchild's wedding in 10 days. She would like to have a prednisone taper for that. She has significant synovitis and joint swelling on examination today. We discussed prednisone taper starting at 20 mg for 2 days, 15 mg for 2 days, 10 mg for one week, 7.5 mg for one week, 5 mg for one week. After that she will go back to her baseline of 4 mg by mouth daily.  High risk medication use - Xeljanz 11mg  XR qd, MTX 4 tabs po qwk, folic acid  mg po qd, prednisone 4 mg po qd. Her labs have been stable. We will continue to monitor her labs every 3 months.  Pain in both hands - Plan: XR Hand 2 View Right, XR Hand  2 View Left: She's been having some stiffness and discomfort. I discussed x-ray findings with her. She is severe rheumatoid arthritis in her right hand with subluxation of MCPs.  Pain in both feet - Plan: XR Foot 2 Views Left, XR Foot 2 Views Right . She had no erosive changes but she does have x-rays consistent with inflammatory arthritis  and osteoarthritis overlap.  Bilateral ankle pain - Plan: XR Ankle 2 Views Left, XR Ankle 2 Views Right. She has subtalar and tibiotalar joint space narrowing.  Primary osteoarthritis of right hip: She is some limitation with range of motion.  Right trochanteric bursitis: She is having pain and discomfort in her right trochanteric bursa. Her request right trochanter bursa was injected with cortisone as described above.  History of total hip replacement, left: Chronic pain  DDD (degenerative disc disease), cervical - s/p fusion : Chronic pain  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  Age-related osteoporosis without current pathological fracture: She was treated with Reclast in 2017. I will discuss next visit about the situation with the repeat infusions.DXA 10/18/2015 radius T score -3.3, BMD 0.495.  Multiple sclerosis ([place]): Followed up by neurology.  History of depression  History of anxiety  History of hyperlipidemia  History of hypertension    Orders: Orders Placed This Encounter  Procedures  . Large Joint Inj: R greater trochanter  . XR Hand 2 View Right  . XR Hand 2 View Left  . XR Foot 2 Views Left  . XR Foot 2 Views Right  . XR Ankle 2 Views Left  . XR Ankle 2 Views Right   Meds ordered this encounter  Medications  . predniSONE (DELTASONE) 5 MG tablet    Sig: Take 20 mg for 2 days, then 15mg  for 2 days, then 10mg  per day for 1 week, then 7.5 mg per day for 1 week, then 5mg  per day for 1 week.    Dispense:  46 tablet    Refill:  0    Face-to-face time spent with patient was 30 minutes. Greater than 50% of time was spent in counseling and coordination of care.  Follow-Up Instructions: Return for Rheumatoid arthritis.   Bo Merino, MD  Note - This record has been created using Editor, commissioning.  Chart creation errors have been sought, but may not always  have been located. Such creation errors do not reflect on  the standard of medical care.

## 2017-05-01 NOTE — Telephone Encounter (Signed)
Patient is coming to be evaluated on 05/01/17.

## 2017-05-01 NOTE — Patient Instructions (Signed)
Standing Labs We placed an order today for your standing lab work.    Please come back and get your standing labs in February and every 3 months  We have open lab Monday through Friday from 8:30-11:30 AM and 1:30-4 PM at the office of Dr. Jamespaul Secrist.   The office is located at [address], [address], [place], [place] [address] No appointment is necessary.   Labs are drawn by [facility].  You may receive a bill from [facility] for your lab work. If you have any questions regarding directions or hours of operation,  please call [phone].    

## 2017-05-06 ENCOUNTER — Ambulatory Visit: Payer: Medicare Other | Admitting: Neurology

## 2017-05-07 ENCOUNTER — Other Ambulatory Visit: Payer: Self-pay | Admitting: Rheumatology

## 2017-05-07 NOTE — Telephone Encounter (Signed)
Last visit: 05/01/17 Next Visit: 06/02/17 Labs: 03/11/17 Potassium elevated. All other labs stable. (PCP aware)   Okay to refill per Dr. Estanislado Pandy

## 2017-05-21 NOTE — Progress Notes (Signed)
Office Visit Note  Patient: Sherri [name]             Date of Birth: [birth year]           MRN: [id number]             PCP: Binnie Rail, MD Referring: Binnie Rail, MD Visit Date: 06/02/2017 Occupation: @GUAROCC @    Subjective:  Other (pain in right leg/hip, left hand numbness )   History of Present Illness: Sherri [name] is a 76 y.o. female with history of rheumatoid arthritis and osteoarthritis overlap. According to her she's been having numbness in her left hand which she describes in the fourth and fifth finger. She denies any joint swelling. She's been having discomfort in her right foot which she describes over the lateral aspect of the dorsum of her foot. She continues to have some lower back and gluteal pain. She denies any neck pain.  Activities of Daily Living:  Patient reports morning stiffness for 4-5 hours.   Patient Reports nocturnal pain.  Difficulty dressing/grooming: Denies Difficulty climbing stairs: Reports Difficulty getting out of chair: Reports Difficulty using hands for taps, buttons, cutlery, and/or writing: Reports   Review of Systems  Constitutional: Positive for fatigue. Negative for night sweats, weight gain, weight loss and weakness.  HENT: Positive for mouth dryness. Negative for mouth sores, trouble swallowing, trouble swallowing and nose dryness.   Eyes: Negative for pain, redness, visual disturbance and dryness.  Respiratory: Negative for cough, shortness of breath and difficulty breathing.   Cardiovascular: Negative for chest pain, palpitations, hypertension, irregular heartbeat and swelling in legs/feet.  Gastrointestinal: Positive for constipation. Negative for blood in stool and diarrhea.  Endocrine: Negative for increased urination.  Genitourinary: Negative for vaginal dryness.  Musculoskeletal: Positive for arthralgias, joint pain and morning stiffness. Negative for joint swelling, myalgias, muscle weakness, muscle tenderness and  myalgias.  Skin: Negative for color change, rash, hair loss, skin tightness, ulcers and sensitivity to sunlight.  Allergic/Immunologic: Negative for susceptible to infections.  Neurological: Negative for dizziness, memory loss and night sweats.  Hematological: Negative for swollen glands.  Psychiatric/Behavioral: Positive for sleep disturbance. Negative for depressed mood. The patient is not nervous/anxious.     PMFS History:  Patient Active Problem List   Diagnosis Date Noted  . Anxiety 04/24/2017  . Mid back pain 03/26/2017  . Left sided chest pain 03/25/2017  . Dyspnea on exertion 03/14/2017  . Hyperkalemia 03/13/2017  . Rash and nonspecific skin eruption 01/08/2017  . Submandibular gland swelling 01/08/2017  . High risk medication use 09/02/2016  . Urinary frequency 09/02/2016  . Piriformis muscle pain 09/02/2016  . Fatigue 07/22/2016  . Hair loss 07/22/2016  . Right hip pain 04/08/2016  . Right knee pain 02/25/2016  . Dyspepsia 04/05/2015  . Numbness 11/14/2014  . Biceps tendon tear 10/10/2014  . Insomnia 10/10/2014  . Gait disturbance 06/13/2014  . 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 OP (osteoporosis) 01/18/2014  . Other long term (current) drug therapy 01/18/2014  . Other dysphagia 12/29/2013  . Hoarseness 12/29/2013  . 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 Personal history of colonic polyps 07/03/2010  . Nocturia 05/28/2010  . THYROID NODULE 05/17/2008  . Multiple sclerosis ([place]) 05/17/2008  . Chronic fatigue 03/30/2007  . Hyperlipidemia 08/20/2006  . Essential hypertension 08/20/2006  . Rheumatoid arthritis ([place]) 08/20/2006  . CERVICAL CANCER, HX OF 08/20/2006  . GASTRIC ULCER, HX OF 08/20/2006    Past Medical History:  Diagnosis  Date  . Anxiety    takes Valium daily as needed  . Bruises easily    d/t meds  . Cancer ([place])    basal cell ca, in situ- uterine   . Cataracts, bilateral   . Chronic back pain   . Dizziness    r/t to meds   . GERD (gastroesophageal reflux disease)    no meds on a regular basis but will take Tums if needed  . Headache(784.0)    r/t neck issues  . History of bronchitis 6-7yrs ago  . History of colon polyps    benign  . Hyperlipidemia    takes Atorvastatin on Mondays and Fridays  . Hypertension    takes Ramipril daily  . Joint pain   . Joint swelling   . Multiple sclerosis ([place])   . Neuromuscular disorder ([place])    Dr. Park Liter- [facility], follows [facility]  . Nocturia   . OA (osteoarthritis)    Dr Jani Gravel weekly, RA- hands- knees- feet   . PONV (postoperative nausea and vomiting)    trouble urinating after surgery in 2014  . Rheumatoid arthritis(714.0)    Dr Estanislado Pandy takes [name] daily  . Skin cancer   . Urinary retention    sees Dr.Wrenn about 2 times a yr    Family History  Problem Relation Age of Onset  . Cancer Mother        pancreatic  . Diabetes Mother   . Heart disease Father        Rheumatic  . Cancer Father        ? stomach  . Cancer Sister        stomach  . Cancer Maternal Aunt        X 4; ? primary  . Heart disease Paternal Aunt   . Cancer Maternal Grandmother        cervical  . Colon cancer Unknown        Aunts  . Multiple sclerosis Daughter   . Hypertension Neg Hx   . Stroke Neg Hx    Past Surgical History:  Procedure Laterality Date  . ABDOMINAL HYSTERECTOMY     1985  . APPENDECTOMY     with TAH  . BACK SURGERY     several  . BROW LIFT Bilateral 10/02/2016   Procedure: BILATERAL LOWER LID BLEPHAROPLASTY;  Surgeon: Clista Bernhardt, MD;  Location: [facility];  Service: Plastics;  Laterality: Bilateral;  . CARPAL TUNNEL RELEASE Right   . cataracts    . CERVICAL FUSION     Dr Joya Salm  . CERVICAL FUSION  12/31/2012   Dr Joya Salm  . CHEST TUBE INSERTION     for traumatic Pneumothorax  . COLONOSCOPY  07/16/2010   normal   . COLONOSCOPY W/ POLYPECTOMY  1997   negative since; Dr Deatra Ina  . ESOPHAGOGASTRODUODENOSCOPY  07/16/2010   normal   . eye lid raise    . EYE SURGERY Bilateral    cataracts removed - /w IOL  . LUMBAR FUSION     Dr Joya Salm  . NASAL SINUS SURGERY    . POSTERIOR CERVICAL FUSION/FORAMINOTOMY N/A 12/31/2012   Procedure: POSTERIOR LATERAL CERVICAL FUSION/FORAMINOTOMY LEVEL 1 CERVICAL THREE-FOUR WITH LATERAL MASS SCREWS;  Surgeon: Floyce Stakes, MD;  Location: [facility];  Service: Neurosurgery;  Laterality: N/A;  . PTOSIS REPAIR Bilateral 10/02/2016   Procedure: INTERNAL PTOSIS REPAIR;  Surgeon: Clista Bernhardt, MD;  Location: [facility];  Service: Plastics;  Laterality: Bilateral;  .  SKIN BIOPSY    . THYROID SURGERY     R lobe removed- 1972, has grown back - CT last done- 2018  . TOTAL HIP ARTHROPLASTY  12/22/2011   Procedure: TOTAL HIP ARTHROPLASTY;  Surgeon: Kerin Salen, MD;  Location: [facility];  Service: Orthopedics;  Laterality: Left;  . TUBAL LIGATION     Social History   Social History Narrative  . Not on file     Objective: Vital Signs: BP 124/73 (BP Location: Right Arm, Patient Position: Sitting, Cuff Size: Normal)   Pulse 68   Resp 16   Ht 5\' 3"  (1.6 m)   Wt 115 lb (52.2 kg)   BMI 20.37 kg/m    Physical Exam  Constitutional: She is oriented to person, place, and time. She appears well-developed and well-nourished.  HENT:  Head: Normocephalic and atraumatic.  Eyes: Conjunctivae and EOM are normal.  Neck: Normal range of motion.  Cardiovascular: Normal rate, regular rhythm, normal heart sounds and intact distal pulses.  Pulmonary/Chest: Effort normal and breath sounds normal.  Abdominal: Soft. Bowel sounds are normal.  Lymphadenopathy:    She has no cervical adenopathy.  Neurological: She is alert and oriented to person, place, and time.  Skin: Skin is warm and dry. Capillary refill takes less than 2 seconds.  Psychiatric: She has a normal mood and affect. Her behavior is normal.  Nursing note and vitals reviewed.    Musculoskeletal Exam: C-spine, thoracic and lumbar spine limited  range of motion. Shoulder joints elbow joints with good range of motion. She is no tenderness over her left elbow. She has limitation of range of motion of her wrist joints with no synovitis. She synovial thickening over MCP joint with caution no synovitis in her right third MCP joint. Left hip joint is replaced with good range of motion without much discomfort. The joints and ankles joints with good range of motion with no swelling. She has some tenderness in the right mid foot and the lateral aspect of her right foot.  CDAI Exam: CDAI Homunculus Exam:   Swelling:  Right hand: 2nd MCP and 3rd MCP  Joint Counts:  CDAI Tender Joint count: 0 CDAI Swollen Joint count: 2  Global Assessments:  Patient Global Assessment: 3 Provider Global Assessment: 3  CDAI Calculated Score: 8    Investigation: No additional findings.TB Gold: 07/29/2016 Negative  CBC Latest Ref Rng & Units 06/01/2017 03/11/2017 12/19/2016  WBC 3.8 - 10.8 Thousand/uL 6.2 6.3 5.1  Hemoglobin 11.7 - 15.5 g/dL 13.6 14.0 13.3  Hematocrit 35.0 - 45.0 % 39.1 39.8 39.7  Platelets 140 - 400 Thousand/uL 366 312 326   CMP Latest Ref Rng & Units 06/01/2017 04/24/2017 03/11/2017  Glucose 65 - 99 mg/dL 94 103(H) 88  BUN 7 - 25 mg/dL 14 10 10   Creatinine 0.60 - 0.93 mg/dL 0.85 0.85 0.84  Sodium 135 - 146 mmol/L 137 137 137  Potassium 3.5 - 5.3 mmol/L 4.7 3.9 5.4(H)  Chloride 98 - 110 mmol/L 99 99 99  CO2 20 - 32 mmol/L 29 33(H) 31  Calcium 8.6 - 10.4 mg/dL 9.8 10.0 9.9  Total Protein 6.1 - 8.1 g/dL 6.5 6.8 6.7  Total Bilirubin 0.2 - 1.2 mg/dL 0.3 0.6 0.5  Alkaline Phos 39 - 117 U/L - 48 -  AST 10 - 35 U/L 23 35 27  ALT 6 - 29 U/L 18 25 22     Imaging: No results found.  Speciality Comments: No specialty comments available.    Procedures:  No procedures performed Allergies: Darvon [propoxyphene hcl]; Meperidine hcl; Penicillins; Azathioprine; and Ezetimibe-simvastatin   Assessment / Plan:     Visit Diagnoses: Rheumatoid  arthritis involving multiple sites with positive rheumatoid factor (HCC) - +RF, +CCP. Patient has severe disease involving multiple joints. She had synovitis and synovial thickening in her right hand. She has limited treatment options due to underlying multiple sclerosis.  Left fourth and fifth finger numbness: I do not see any synovitis over her elbow or her wrist joint. She has history of disc disease of her C-spine. I've advised her to schedule an appointment with her neurologist for follow-up. She may need nerve conduction velocity to evaluate this further.  Right foot pain: Her x-rays in December were unremarkable. I do not see any synovitis but she does have significant tenderness and pain with mobility. I will schedule MRI of her right foot to look for synovitis.  High risk medication use - Xeljanz 11mg  XR qd, MTX 4 tabs po qwk, folic acid  mg po qd, prednisone 4 mg po qd. - Plan: CBC with Differential/Platelet, COMPLETE METABOLIC PANEL WITH GFR, QuantiFERON-TB Gold Plus. Her labs have been stable we will continue to monitor her labs every 3 months.  Primary osteoarthritis of right hip: Chronic pain  Trochanteric bursitis, right hip  History of total hip replacement, left: She still have some discomfort in her left hip she has had total hip replacement.  DDD (degenerative disc disease), cervical - s/p fusion. She continues to have some discomfort in her C-spine.  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  Age-related osteoporosis without current pathological fracture -  She was treated with Reclast in 2017. I will discuss next visit about the situation with the repeat infusions.DXA 10/18/2015 radius T score -3.3, BMD 0.495 - Plan: VITAMIN D 25 Hydroxy (Vit-D Deficiency, Fractures). She missed her Reclast last year. We will schedule Reclast as soon as possible.  History of hypertension  History of anxiety  History of depression  History of hyperlipidemia  History of multiple  sclerosis - Followed up by neurology.    Orders: Orders Placed This Encounter  Procedures  . MR FOOT RIGHT WO CONTRAST  . CBC with Differential/Platelet  . COMPLETE METABOLIC PANEL WITH GFR  . QuantiFERON-TB Gold Plus  . VITAMIN D 25 Hydroxy (Vit-D Deficiency, Fractures)   No orders of the defined types were placed in this encounter.   Face-to-face time spent with patient was 30 minutes. Greater than 50% of time was spent in counseling and coordination of care.  Follow-Up Instructions: Return in about 5 months (around 10/31/2017) for Rheumatoid arthritis, Osteoarthritis.   Bo Merino, MD  Note - This record has been created using Editor, commissioning.  Chart creation errors have been sought, but may not always  have been located. Such creation errors do not reflect on  the standard of medical care.

## 2017-05-22 ENCOUNTER — Encounter: Payer: Self-pay | Admitting: Internal Medicine

## 2017-05-25 ENCOUNTER — Telehealth: Payer: Self-pay | Admitting: Rheumatology

## 2017-05-25 NOTE — Telephone Encounter (Signed)
Patient called to see if there were any Xeljanz samples available.  CB# [phone]

## 2017-05-27 ENCOUNTER — Telehealth: Payer: Self-pay | Admitting: Rheumatology

## 2017-05-27 NOTE — Telephone Encounter (Signed)
Attempted to contact the patient and left message for patient to call the office.  

## 2017-05-27 NOTE — Telephone Encounter (Signed)
See previous phone note.  

## 2017-05-27 NOTE — Telephone Encounter (Signed)
Patient advised she may come by the office to pick up samples.

## 2017-05-27 NOTE — Telephone Encounter (Signed)
Patient left a message to request prescription refill for Xeljanz.  CB [phone]

## 2017-05-30 ENCOUNTER — Other Ambulatory Visit: Payer: Self-pay | Admitting: Rheumatology

## 2017-06-01 ENCOUNTER — Other Ambulatory Visit: Payer: Self-pay

## 2017-06-01 ENCOUNTER — Other Ambulatory Visit: Payer: Self-pay | Admitting: Internal Medicine

## 2017-06-01 DIAGNOSIS — Z79899 Other long term (current) drug therapy: Secondary | ICD-10-CM

## 2017-06-01 LAB — CBC WITH DIFFERENTIAL/PLATELET
Basophils Absolute: 50 cells/uL (ref 0–200)
Basophils Relative: 0.8 %
Eosinophils Absolute: 31 cells/uL (ref 15–500)
Eosinophils Relative: 0.5 %
HCT: 39.1 % (ref 35.0–45.0)
HEMOGLOBIN: 13.6 g/dL (ref 11.7–15.5)
LYMPHS ABS: 924 {cells}/uL (ref 850–3900)
MCH: 34.9 pg — ABNORMAL HIGH (ref 27.0–33.0)
MCHC: 34.8 g/dL (ref 32.0–36.0)
MCV: 100.3 fL — ABNORMAL HIGH (ref 80.0–100.0)
MONOS PCT: 8.7 %
MPV: 10.3 fL (ref 7.5–12.5)
NEUTROS ABS: 4656 {cells}/uL (ref 1500–7800)
NEUTROS PCT: 75.1 %
PLATELETS: 366 10*3/uL (ref 140–400)
RBC: 3.9 10*6/uL (ref 3.80–5.10)
RDW: 13.4 % (ref 11.0–15.0)
Total Lymphocyte: 14.9 %
WBC mixed population: 539 cells/uL (ref 200–950)
WBC: 6.2 10*3/uL (ref 3.8–10.8)

## 2017-06-01 LAB — COMPLETE METABOLIC PANEL WITH GFR
AG Ratio: 1.7 (calc) (ref 1.0–2.5)
ALKALINE PHOSPHATASE (APISO): 47 U/L (ref 33–130)
ALT: 18 U/L (ref 6–29)
AST: 23 U/L (ref 10–35)
Albumin: 4.1 g/dL (ref 3.6–5.1)
BILIRUBIN TOTAL: 0.3 mg/dL (ref 0.2–1.2)
BUN: 14 mg/dL (ref 7–25)
CHLORIDE: 99 mmol/L (ref 98–110)
CO2: 29 mmol/L (ref 20–32)
Calcium: 9.8 mg/dL (ref 8.6–10.4)
Creat: 0.85 mg/dL (ref 0.60–0.93)
GFR, Est African American: 78 mL/min/{1.73_m2} (ref >=60)
GFR, Est Non African American: 67 mL/min/{1.73_m2} (ref >=60)
GLUCOSE: 94 mg/dL (ref 65–99)
Globulin: 2.4 g/dL (calc) (ref 1.9–3.7)
POTASSIUM: 4.7 mmol/L (ref 3.5–5.3)
Sodium: 137 mmol/L (ref 135–146)
Total Protein: 6.5 g/dL (ref 6.1–8.1)

## 2017-06-01 NOTE — Telephone Encounter (Signed)
If she is doing well with the 500 mg - ok to fill that amount

## 2017-06-01 NOTE — Telephone Encounter (Signed)
Copied from [place]. Topic: Quick Communication - See Telephone Encounter >> Jun 01, 2017  9:54 AM Ether Griffins B wrote: CRM for notification. See Telephone encounter for:  Pt needing refill on gabapentin. Also needing dosage corrected on bottle due to Dr. Quay Burow upping how much she takes. Pt now takes 200 mg in the AM and 300 mg in the PM. Pt is out due to the up in dose. And [facility] is stating she will have to pay out of pocket bc its not time for a refill due the dose that's on the bottle for her to take.  06/01/17.

## 2017-06-01 NOTE — Telephone Encounter (Signed)
Spoke with pt, advised that per MyChart message on 12/30 she was to increase to 200 mg then to 300mg . Pt has been taking a total of 500 mg daily. States she has an appt with her arthritis dr today and advised her to discuss hand pain with them. Please advise on dosage.

## 2017-06-02 ENCOUNTER — Encounter: Payer: Self-pay | Admitting: Rheumatology

## 2017-06-02 ENCOUNTER — Other Ambulatory Visit: Payer: Self-pay | Admitting: *Deleted

## 2017-06-02 ENCOUNTER — Ambulatory Visit: Payer: Medicare Other | Admitting: Rheumatology

## 2017-06-02 VITALS — BP 124/73 | HR 68 | Resp 16 | Ht 63.0 in | Wt 115.0 lb

## 2017-06-02 DIAGNOSIS — M1611 Unilateral primary osteoarthritis, right hip: Secondary | ICD-10-CM

## 2017-06-02 DIAGNOSIS — Z8679 Personal history of other diseases of the circulatory system: Secondary | ICD-10-CM | POA: Diagnosis not present

## 2017-06-02 DIAGNOSIS — M810 Age-related osteoporosis without current pathological fracture: Secondary | ICD-10-CM

## 2017-06-02 DIAGNOSIS — Z96642 Presence of left artificial hip joint: Secondary | ICD-10-CM | POA: Diagnosis not present

## 2017-06-02 DIAGNOSIS — Z79899 Other long term (current) drug therapy: Secondary | ICD-10-CM | POA: Diagnosis not present

## 2017-06-02 DIAGNOSIS — Z8639 Personal history of other endocrine, nutritional and metabolic disease: Secondary | ICD-10-CM | POA: Diagnosis not present

## 2017-06-02 DIAGNOSIS — M5030 Other cervical disc degeneration, unspecified cervical region: Secondary | ICD-10-CM

## 2017-06-02 DIAGNOSIS — M0579 Rheumatoid arthritis with rheumatoid factor of multiple sites without organ or systems involvement: Secondary | ICD-10-CM

## 2017-06-02 DIAGNOSIS — Z8659 Personal history of other mental and behavioral disorders: Secondary | ICD-10-CM | POA: Diagnosis not present

## 2017-06-02 DIAGNOSIS — M19079 Primary osteoarthritis, unspecified ankle and foot: Secondary | ICD-10-CM

## 2017-06-02 DIAGNOSIS — M4312 Spondylolisthesis, cervical region: Secondary | ICD-10-CM

## 2017-06-02 DIAGNOSIS — G35 Multiple sclerosis: Secondary | ICD-10-CM

## 2017-06-02 DIAGNOSIS — M7061 Trochanteric bursitis, right hip: Secondary | ICD-10-CM

## 2017-06-02 DIAGNOSIS — Z8669 Personal history of other diseases of the nervous system and sense organs: Secondary | ICD-10-CM

## 2017-06-02 DIAGNOSIS — M79671 Pain in right foot: Secondary | ICD-10-CM

## 2017-06-02 NOTE — Patient Instructions (Addendum)
Standing Labs We placed an order today for your standing lab work.    Please come back and get your standing labs in April and every 3 months TB gold and vitamin D are due in April  We have open lab Monday through Friday from 8:30-11:30 AM and 1:30-4 PM at the office of Dr. Bo Merino.   The office is located at [address], [place], [place], [place] [address] No appointment is necessary.   Labs are drawn by [facility].  You may receive a bill from [facility] for your lab work. If you have any questions regarding directions or hours of operation,  please call [phone].

## 2017-06-04 ENCOUNTER — Telehealth: Payer: Self-pay | Admitting: Rheumatology

## 2017-06-04 NOTE — Telephone Encounter (Signed)
Patient checking on status of Infusion, and MRI to be scheduled. Please call to inform.

## 2017-06-08 NOTE — Telephone Encounter (Signed)
Patient advised orders have been placed for infusion and patient given medical day phone number so she may schedule her appointment. Patient advise message has been sent to [facility] in regards to her MRI and she should be in contact with her.

## 2017-06-09 ENCOUNTER — Other Ambulatory Visit: Payer: Self-pay

## 2017-06-09 ENCOUNTER — Telehealth: Payer: Self-pay | Admitting: Rheumatology

## 2017-06-09 ENCOUNTER — Encounter: Payer: Self-pay | Admitting: Neurology

## 2017-06-09 ENCOUNTER — Ambulatory Visit: Payer: Medicare Other | Admitting: Neurology

## 2017-06-09 VITALS — BP 145/81 | HR 65 | Resp 16 | Wt 115.0 lb

## 2017-06-09 DIAGNOSIS — G35 Multiple sclerosis: Secondary | ICD-10-CM

## 2017-06-09 DIAGNOSIS — M0579 Rheumatoid arthritis with rheumatoid factor of multiple sites without organ or systems involvement: Secondary | ICD-10-CM

## 2017-06-09 DIAGNOSIS — Z79899 Other long term (current) drug therapy: Secondary | ICD-10-CM | POA: Diagnosis not present

## 2017-06-09 DIAGNOSIS — M7918 Myalgia, other site: Secondary | ICD-10-CM | POA: Diagnosis not present

## 2017-06-09 DIAGNOSIS — M25551 Pain in right hip: Secondary | ICD-10-CM

## 2017-06-09 MED ORDER — OXYCODONE HCL 10 MG PO TABS: 10.0000 mg | ORAL_TABLET | Freq: Three times a day (TID) | ORAL | 0 refills | Status: DC | PRN

## 2017-06-09 NOTE — Progress Notes (Signed)
[facility]  PATIENT: Sherri [name] DOB: [birth year]  REFERRING CLINICIAN: Unice Cobble  HISTORY FROM: patient REASON FOR VISIT: Multiple sclerosis   HISTORICAL  CHIEF COMPLAINT:  Chief Complaint  Patient presents with  . Multiple Sclerosis    Not on MS dmt. Still on Xeljanz, Methotrexate and Prednisone for RA. C/O pain right buttock, hip and down side of right leg to toes, onset 2 mos. ago without known injury. Has seen rheumatology and dx. with bursitis.  Hip inj. helped for 24 hrs. Left 4th and 5th fingers numb for one month./fim  . Rheumatoid Arthritis    HISTORY OF PRESENT ILLNESS:  Sherri [name] is a 77 year old woman who was diagnosed with relapsing remitting MS in 2010.     Update 06/09/2017: She feels the MS is stable.   No new weakness or numbness and gait is unchanged. She remains on methotrexate and Xeljanz for her rheumatoid arthritis. She is off of her MS disease modifying therapy. She did not tolerate leflunomide due to hair loss.  She reports more hip pain that radiates into her leg all the way to the side of the foot..   A recent hip injection by Dr. Estanislado Pandy did not help.   Of note she has had L4L5 and L5S1 fusion by Dr. Joya Salm in the past.     Her gabapentin dose is currently 100 in am and 100 mg at night.   He had trouble tolerating 200 mg in the morning and 300 mg at night.   Gabapentin did help her RLS and she tolerates it well.   It does not help her pain..     From 10/30/2016: MS:   She feels her MS is stable.    She also has RA and is on Xeljanz and prednisone.    She is not on any DMT for her MS.     She denies any recent exacerbation.     Her right leg sometimes gives out on her but the changes have been gradual.     Vision:    She had recent surgery for ptosis (10/02/16) after she had cataract surgery (still had reduced peripheral vision after initil surgery when she noted the problem more).      RA:   She remains on Xeljanz and  methotrexate.   MTX dose reduced as higher does may have affected renal function and dose was reduced.     Gait/strength:    She feels gait is worse and she notes dragging her right foot more, even for short distances.    He also notes more cramps in the right leg.   The right leg also sometimes gives out randomly.  Gait also affected by right hip pain and she also had left THR in the past.      She feels gait is not bad enough to use a cane but does feel off balanced.    The legs (R>L) also ache a lot more.  Tramadol during the day and oxycodone and valium at night help the pain a lot.   Leg cramps are worse when she lays down.    Sensation:   She notes mild foot numbness bilaterally which is stable.     She has stable left facial numbness  in V2 and V3 > V1.   This was her first symptom (tongue was numb many years before diagnosis).    No arm numbness.     Bladder:   She has urinary frequency and hesitancy.  She does not completely empty (has PVR's at urology).    She has no recent UTI's,       Fatigue/sleep:     Fatigue is mild most days but worse a day after 'overdoing it'.    She is sleeping well with Valium 5 mg and Oxycodone 10 mg at bedtime .   He sleeps better with her pain under better control.    She is active, walking daily.    Mood/cognition:     Mood is doing well.    She does not note that she has any significant cognitive changes.       MS History:   When she was in her late 78s she had an episode of right-sided numbness and she lost the use of her right arm for a couple of weeks. At that time, she was told that she might have MS. Over the next 30 or 40 years she had several more similar but milder episodes. In 2010, she had an MRI of the cervical spine (for arm pain) and it showed foci c/w multiple sclerosis. An MRI of the brain was also performed  shortly thereafter showing white matter lesions consistent with the diagnosis.  Initially, she was placed on Avonex. She also has rheumatoid  arthritis and was placed on methotrexate and then a combination of methotrexate with [name]. We decided to stop the Avonex as she probably would get some benefit from those medications for the MS as well. For the most part her MS has been stable over the past 5 years.However, early 2016,  she had numbness in the left face and weakness in her legs   REVIEW OF SYSTEMS:  Constitutional: No fevers, chills, sweats, or change in appetite Eyes: No visual changes, double vision, eye pain Ear, nose and throat: No hearing loss, ear pain, nasal congestion, sore throat Cardiovascular: No chest pain, palpitations Respiratory:  No shortness of breath at rest or with exertion.   No wheezes GastrointestinaI: No nausea, vomiting, diarrhea, abdominal pain, fecal incontinence Genitourinary:  as above. Musculoskeletal:  No neck pain, back pain Integumentary: No rash, pruritus, skin lesions Neurological: as above Psychiatric: he has had a lot of family stress leading to some depression and more anxiety. Endocrine: No palpitations, diaphoresis, change in appetite, change in weigh or increased thirst Hematologic/Lymphatic:  No anemia, purpura, petechiae. Allergic/Immunologic: No itchy/runny eyes, nasal congestion, recent allergic reactions, rashes  ALLERGIES: Allergies  Allergen Reactions  . Darvon [Propoxyphene Hcl] Shortness Of Breath    ? Dose Related ? Lowered respirations greatly  . Meperidine Hcl Shortness Of Breath  . Penicillins Hives and Other (See Comments)    Has patient had a PCN reaction causing immediate rash, facial/tongue/throat swelling, SOB or lightheadedness with hypotension: No SEVERE RASH INVOLVING MUCUS MEMBRANES or SKIN NECROSIS: #  #  #  YES  #  #  #  Has patient had a PCN reaction that required hospitalization No Has patient had a PCN reaction occurring within the last 10 years: No.   . Azathioprine Other (See Comments)    Weakness  . Ezetimibe-Simvastatin Nausea And Vomiting     HOME MEDICATIONS: Outpatient Medications Prior to Visit  Medication Sig Dispense Refill  . atorvastatin (LIPITOR) 10 MG tablet TAKE 1 TABLET BY MOUTH ON MONDAYS AND FRIDAY 27 tablet 2  . Biotin 5000 MCG TABS Take 5,000 mcg by mouth daily.     . Calcium Carbonate-Vitamin D (CALCIUM + D PO) Take 1 tablet by mouth daily.     [name]  Carboxymethylcellul-Glycerin (LUBRICATING EYE DROPS OP) Apply 1 drop to eye daily as needed (dry eyes).    . carvedilol (COREG) 6.25 MG tablet Take 1 tablet (6.25 mg total) by mouth 2 (two) times daily with a meal. 60 tablet 3  . Cholecalciferol (VITAMIN D3) 2000 units capsule Take 2,000 Units by mouth daily.    . Cyanocobalamin (VITAMIN B-12 PO) Take 1 tablet by mouth daily.    . diazepam (VALIUM) 5 MG tablet Take 5 mg by mouth as needed for anxiety.     . diclofenac sodium (VOLTAREN) 1 % GEL APPLY 3 GRAMS TO 3 LARGE JOINTS 3 TIMES A DAY AS NEEDED 063 g 3  . folic acid (FOLVITE) 1 MG tablet TAKE 1 TABLET BY MOUTH EVERY DAY 90 tablet 4  . furosemide (LASIX) 20 MG tablet TAKE 1 TABLET BY MOUTH EVERY DAY 90 tablet 1  . gabapentin (NEURONTIN) 100 MG capsule Take 2 capsules in AM and take 3 capsules in PM 150 capsule 0  . methotrexate (RHEUMATREX) 2.5 MG tablet TAKE 4 TABLETS (10 MG TOTAL) BY MOUTH ONCE A WEEK. CAUTION:CHEMOTHERAPY. PROTECT FROM LIGHT. 48 tablet 0  . Oxycodone HCl 10 MG TABS Take 1 tablet (10 mg total) by mouth 3 (three) times daily as needed. (Patient taking differently: Take 10 mg by mouth at bedtime. ) 90 tablet 0  . predniSONE (DELTASONE) 5 MG tablet Take 20 mg for 2 days, then 15mg  for 2 days, then 10mg  per day for 1 week, then 7.5 mg per day for 1 week, then 5mg  per day for 1 week. 46 tablet 0  . Pyridoxine HCl (VITAMIN B-6 PO) Take 1 tablet by mouth daily.    [name] tiZANidine (ZANAFLEX) 4 MG capsule Take 4 mg by mouth 3 (three) times daily as needed for muscle spasms.    . Tofacitinib Citrate (XELJANZ XR) 11 MG TB24 Take by mouth daily.    . traMADol  (ULTRAM) 50 MG tablet Take 50 mg every 6 (six) hours as needed by mouth for severe pain.    . predniSONE (DELTASONE) 1 MG tablet Take 4 tablets (4 mg total) by mouth daily with breakfast. 360 tablet 1  . Tofacitinib Citrate (XELJANZ XR) 11 MG TB24 Take 11 mg by mouth daily. 60 tablet 0   No facility-administered medications prior to visit.     PAST MEDICAL HISTORY: Past Medical History:  Diagnosis Date  . Anxiety    takes Valium daily as needed  . Bruises easily    d/t meds  . Cancer ([place])    basal cell ca, in situ- uterine   . Cataracts, bilateral   . Chronic back pain   . Dizziness    r/t to meds  . GERD (gastroesophageal reflux disease)    no meds on a regular basis but will take Tums if needed  . Headache(784.0)    r/t neck issues  . History of bronchitis 6-21yrs ago  . History of colon polyps    benign  . Hyperlipidemia    takes Atorvastatin on Mondays and Fridays  . Hypertension    takes Ramipril daily  . Joint pain   . Joint swelling   . Multiple sclerosis ([place])   . Neuromuscular disorder ([place])    Dr. Park Liter- [facility], follows [facility].S.  . Nocturia   . OA (osteoarthritis)    Dr Jani Gravel weekly, RA- hands- knees- feet   . PONV (postoperative nausea and vomiting)    trouble urinating after surgery in 2014  .  Rheumatoid arthritis(714.0)    Dr Estanislado Pandy takes [name] daily  . Skin cancer   . Urinary retention    sees Dr.Wrenn about 2 times a yr    PAST SURGICAL HISTORY: Past Surgical History:  Procedure Laterality Date  . ABDOMINAL HYSTERECTOMY     1985  . APPENDECTOMY     with TAH  . BACK SURGERY     several  . BROW LIFT Bilateral 10/02/2016   Procedure: BILATERAL LOWER LID BLEPHAROPLASTY;  Surgeon: Clista Bernhardt, MD;  Location: [facility];  Service: Plastics;  Laterality: Bilateral;  . CARPAL TUNNEL RELEASE Right   . cataracts    . CERVICAL FUSION     Dr Joya Salm  . CERVICAL FUSION  12/31/2012   Dr Joya Salm  . CHEST TUBE INSERTION      for traumatic Pneumothorax  . COLONOSCOPY  07/16/2010   normal   . COLONOSCOPY W/ POLYPECTOMY  1997   negative since; Dr Deatra Ina  . ESOPHAGOGASTRODUODENOSCOPY  07/16/2010   normal  . eye lid raise    . EYE SURGERY Bilateral    cataracts removed - /w IOL  . LUMBAR FUSION     Dr Joya Salm  . NASAL SINUS SURGERY    . POSTERIOR CERVICAL FUSION/FORAMINOTOMY N/A 12/31/2012   Procedure: POSTERIOR LATERAL CERVICAL FUSION/FORAMINOTOMY LEVEL 1 CERVICAL THREE-FOUR WITH LATERAL MASS SCREWS;  Surgeon: Floyce Stakes, MD;  Location: [facility];  Service: Neurosurgery;  Laterality: N/A;  . PTOSIS REPAIR Bilateral 10/02/2016   Procedure: INTERNAL PTOSIS REPAIR;  Surgeon: Clista Bernhardt, MD;  Location: [facility];  Service: Plastics;  Laterality: Bilateral;  . SKIN BIOPSY    . THYROID SURGERY     R lobe removed- 1972, has grown back - CT last done- 2018  . TOTAL HIP ARTHROPLASTY  12/22/2011   Procedure: TOTAL HIP ARTHROPLASTY;  Surgeon: Kerin Salen, MD;  Location: [facility];  Service: Orthopedics;  Laterality: Left;  . TUBAL LIGATION      FAMILY HISTORY: Family History  Problem Relation Age of Onset  . Cancer Mother        pancreatic  . Diabetes Mother   . Heart disease Father        Rheumatic  . Cancer Father        ? stomach  . Cancer Sister        stomach  . Cancer Maternal Aunt        X 4; ? primary  . Heart disease Paternal Aunt   . Cancer Maternal Grandmother        cervical  . Colon cancer Unknown        Aunts  . Multiple sclerosis Daughter   . Hypertension Neg Hx   . Stroke Neg Hx     SOCIAL HISTORY:  Social History   Socioeconomic History  . Marital status: Married    Spouse name: Not on file  . Number of children: Not on file  . Years of education: Not on file  . Highest education level: Not on file  Social Needs  . Financial resource strain: Not on file  . Food insecurity - worry: Not on file  . Food insecurity - inability: Not on file  . Transportation needs -  medical: Not on file  . Transportation needs - non-medical: Not on file  Occupational History  . Occupation: retired  Tobacco Use  . Smoking status: Former Smoker    Last attempt to quit: 05/26/1981    Years since quitting:  36.0  . Smokeless tobacco: Never Used  . Tobacco comment: Smoked [phone], up to 2.5 ppd  Substance and Sexual Activity  . Alcohol use: Yes    Alcohol/week: 0.0 oz    Comment: 2% beer  . Drug use: No  . Sexual activity: Yes  Other Topics Concern  . Not on file  Social History Narrative  . Not on file     PHYSICAL EXAM  Vitals:   06/09/17 0929  BP: (!) 145/81  Pulse: 65  Resp: 16  Weight: 115 lb (52.2 kg)    Body mass index is 20.37 kg/m.   General: The patient is well-developed and well-nourished and in no acute distress  Musculoskeletal:   She is tender over the right piriformis muscle, more so than the trochanteric bursa.   Skin: Extremities are without rash or edema.  Neurologic Exam  Mental status: The patient is alert and oriented x 3 at the time of the examination. The patient has apparent normal recent and remote memory, with an apparently normal attention span and concentration ability.   Speech is normal.  CN:  EOMI, reduced facial sensation to soft touch left V2 (now V3 is ok) .Facial strength is normal.  Palatal elevation is normal and Tongue protrusion midline. Trapezius and sternocleidomastoid strength is normal. No dysarthria is noted.   No obvious hearing deficits are noted.  Motor:  Right biceps bulges (she has a history of rupture).   Tone is mildly increased in her legs, left > right. Strength appears 5 / 5 in all 4 extremities except 4/5 ulnar innervated intrinsic hand muscles on the right.     Sensory: Sensory testing is intact to touch and vibration in the arms and legs.  Coordination: Cerebellar testing reveals good rapid alternating movements in hands .  Mildly reduced heel-to-shin, right worse than left  Gait and  station: Station is stable. Gait was mildly wide and arthritic. She has a mild right foot drop. The tandem gait is wide..      Romberg is negative.   Reflexes: Deep tendon reflexes are symmetric and normal in legs.      DIAGNOSTIC DATA (LABS, IMAGING, TESTING) - I reviewed patient records, labs, notes, testing and imaging myself where available.  Lab Results  Component Value Date   WBC 6.2 06/01/2017   HGB 13.6 06/01/2017   HCT 39.1 06/01/2017   MCV 100.3 (H) 06/01/2017   PLT 366 06/01/2017      Component Value Date/Time   NA 137 06/01/2017 1439   NA 141 07/01/2016 1516   NA 136 02/22/2015   K 4.7 06/01/2017 1439   K 4.4 02/22/2015   CL 99 06/01/2017 1439   CL 98 02/22/2015   CO2 29 06/01/2017 1439   CO2 30 02/22/2015   GLUCOSE 94 06/01/2017 1439   BUN 14 06/01/2017 1439   BUN 9 07/01/2016 1516   CREATININE 0.85 06/01/2017 1439   CALCIUM 9.8 06/01/2017 1439   CALCIUM 9.6 02/22/2015   PROT 6.5 06/01/2017 1439   PROT 6.7 07/01/2016 1516   PROT 6.6 02/22/2015   ALBUMIN 4.1 04/24/2017 1634   ALBUMIN 4.5 07/01/2016 1516   ALBUMIN 4.3 02/22/2015   AST 23 06/01/2017 1439   AST 24 02/22/2015   ALT 18 06/01/2017 1439   ALT 18 02/22/2015   ALKPHOS 48 04/24/2017 1634   ALKPHOS 34 02/22/2015   BILITOT 0.3 06/01/2017 1439   BILITOT 0.3 07/01/2016 1516   GFRNONAA 67 06/01/2017 1439   GFRAA 78  06/01/2017 1439   Lab Results  Component Value Date   CHOL 185 04/24/2017   HDL 72.00 04/24/2017   LDLCALC 98 04/24/2017   LDLDIRECT 122.9 11/15/2012   TRIG 75.0 04/24/2017   CHOLHDL 3 04/24/2017   Lab Results  Component Value Date   HGBA1C 5.6 10/24/2013   Lab Results  Component Value Date   ENIDPOEU23 536 (H) 07/22/2016   Lab Results  Component Value Date   TSH 1.55 07/22/2016        ASSESSMENT AND PLAN  Multiple sclerosis (HCC)  Rheumatoid arthritis involving multiple sites with positive rheumatoid factor (HCC)  Right hip pain  High risk medication  use  Piriformis muscle pain   1.   She will remain off of a disease modifying therapy for MS but continue XelJanz, methotrexate and prednisone for her rheumatoid arthritis per Dr. Estanislado Pandy.   If she has another relapse or if she does not do well with her RA, rituximab could be a good choice with efficacy for both diseases. 2.    Continue oxycodone at bedtime.   Try to take valium at night sparingly.    Increase gabapentin to 100 mg in am - 100 mg at dinner and 200 mg at night.     3.    Refer to PT for right piriformis syndrome (vs. S1 radic. If no better, consider MRI to assess right S1 (and L5) nerve roots. 4.   return to see [place] in 4-5 months or sooner if she has new or worsening neurologic symptoms.    Norelle Runnion A. Felecia Shelling, MD, PhD 1/44/3154, 00:86 AM Certified in Neurology, Clinical Neurophysiology, Sleep Medicine, Pain Medicine and Neuroimaging  [facility] [address], [place],  [address] [phone]

## 2017-06-09 NOTE — Telephone Encounter (Signed)
error 

## 2017-06-10 ENCOUNTER — Telehealth: Payer: Self-pay | Admitting: Rheumatology

## 2017-06-10 NOTE — Telephone Encounter (Signed)
Patient left a voicemail requesting a return call regarding the MRI she was going to schedule for her foot.  Patient's CB# [phone]

## 2017-06-10 NOTE — Telephone Encounter (Signed)
I returned pt call and she states she is wanting to cancel the MRI of her R foot, she is going to start PT instead. Referral order for MRI of R foot has been cancelled.

## 2017-06-16 ENCOUNTER — Other Ambulatory Visit (HOSPITAL_COMMUNITY): Payer: Self-pay | Admitting: *Deleted

## 2017-06-16 ENCOUNTER — Ambulatory Visit (HOSPITAL_COMMUNITY): Payer: Medicare Other

## 2017-06-17 ENCOUNTER — Ambulatory Visit (HOSPITAL_COMMUNITY)
Admission: RE | Admit: 2017-06-17 | Discharge: 2017-06-17 | Disposition: A | Discharge status: Home / Self Care | Payer: Medicare Other | Source: Ambulatory Visit | Attending: Rheumatology | Admitting: Rheumatology

## 2017-06-17 DIAGNOSIS — M810 Age-related osteoporosis without current pathological fracture: Secondary | ICD-10-CM | POA: Diagnosis not present

## 2017-06-17 MED ORDER — ZOLEDRONIC ACID 5 MG/100ML IV SOLN
INTRAVENOUS | Status: AC
  Administered 2017-06-17: 12:00:00 5 mg
  Filled 2017-06-17: qty 100

## 2017-06-17 MED ORDER — ZOLEDRONIC ACID 5 MG/100ML IV SOLN: 5.0000 mg | Freq: Once | INTRAVENOUS | Status: DC

## 2017-06-24 NOTE — Telephone Encounter (Signed)
Please close encounter

## 2017-06-26 ENCOUNTER — Telehealth: Payer: Self-pay | Admitting: Emergency Medicine

## 2017-06-26 MED ORDER — GABAPENTIN 100 MG PO CAPS: ORAL_CAPSULE | ORAL | 0 refills | Status: DC

## 2017-06-26 NOTE — Telephone Encounter (Signed)
Yes, ok 

## 2017-06-26 NOTE — Telephone Encounter (Signed)
Received fax from [facility] requesting 90 day supply Gabapentin. Total of 450 capsules. Are you okay with sending 90 days in?

## 2017-06-29 ENCOUNTER — Other Ambulatory Visit: Payer: Self-pay

## 2017-06-29 ENCOUNTER — Other Ambulatory Visit: Payer: Self-pay | Admitting: Emergency Medicine

## 2017-06-29 ENCOUNTER — Encounter: Payer: Self-pay | Admitting: Rehabilitation

## 2017-06-29 ENCOUNTER — Ambulatory Visit: Payer: Medicare Other | Attending: Neurology | Admitting: Rehabilitation

## 2017-06-29 DIAGNOSIS — M25551 Pain in right hip: Secondary | ICD-10-CM

## 2017-06-29 DIAGNOSIS — M25552 Pain in left hip: Secondary | ICD-10-CM

## 2017-06-29 DIAGNOSIS — R209 Unspecified disturbances of skin sensation: Secondary | ICD-10-CM | POA: Diagnosis not present

## 2017-06-29 MED ORDER — GABAPENTIN 100 MG PO CAPS: ORAL_CAPSULE | ORAL | 0 refills | Status: DC

## 2017-06-29 NOTE — Therapy (Signed)
[facility] [address] [place], [place], [address] Phone: [phone]   Fax:  [phone]  Physical Therapy Evaluation  Patient Details  Name: Sherri [name] MRN: [id number] Date of Birth: 05-24-1941 Referring Provider: Arlice Colt, MD   Encounter Date: 06/29/2017  PT End of Session - 06/29/17 1712    Visit Number  1    Number of Visits  9    Date for PT Re-Evaluation  08/28/17    Authorization Type  UHC Medicare    PT Start Time  1446    PT Stop Time  1535    PT Time Calculation (min)  49 min    Activity Tolerance  Patient tolerated treatment well    Behavior During Therapy  [facility] for tasks assessed/performed       Past Medical History:  Diagnosis Date  . Anxiety    takes Valium daily as needed  . Bruises easily    d/t meds  . Cancer ([place])    basal cell ca, in situ- uterine   . Cataracts, bilateral   . Chronic back pain   . Dizziness    r/t to meds  . GERD (gastroesophageal reflux disease)    no meds on a regular basis but will take Tums if needed  . Headache(784.0)    r/t neck issues  . History of bronchitis 6-47yrs ago  . History of colon polyps    benign  . Hyperlipidemia    takes Atorvastatin on Mondays and Fridays  . Hypertension    takes Ramipril daily  . Joint pain   . Joint swelling   . Multiple sclerosis ([place])   . Neuromuscular disorder ([place])    Dr. Park Liter- [facility], follows M.S.  . Nocturia   . OA (osteoarthritis)    Dr Jani Gravel weekly, RA- hands- knees- feet   . PONV (postoperative nausea and vomiting)    trouble urinating after surgery in 2014  . Rheumatoid arthritis(714.0)    Dr Estanislado Pandy takes [name] daily  . Skin cancer   . Urinary retention    sees Dr.Wrenn about 2 times a yr    Past Surgical History:  Procedure Laterality Date  . ABDOMINAL HYSTERECTOMY     1985  . APPENDECTOMY     with TAH  . BACK SURGERY     several  . BROW LIFT Bilateral  10/02/2016   Procedure: BILATERAL LOWER LID BLEPHAROPLASTY;  Surgeon: Clista Bernhardt, MD;  Location: [facility];  Service: Plastics;  Laterality: Bilateral;  . CARPAL TUNNEL RELEASE Right   . cataracts    . CERVICAL FUSION     Dr Joya Salm  . CERVICAL FUSION  12/31/2012   Dr Joya Salm  . CHEST TUBE INSERTION     for traumatic Pneumothorax  . COLONOSCOPY  07/16/2010   normal   . COLONOSCOPY W/ POLYPECTOMY  1997   negative since; Dr Deatra Ina  . ESOPHAGOGASTRODUODENOSCOPY  07/16/2010   normal  . eye lid raise    . EYE SURGERY Bilateral    cataracts removed - /w IOL  . LUMBAR FUSION     Dr Joya Salm  . NASAL SINUS SURGERY    . POSTERIOR CERVICAL FUSION/FORAMINOTOMY N/A 12/31/2012   Procedure: POSTERIOR LATERAL CERVICAL FUSION/FORAMINOTOMY LEVEL 1 CERVICAL THREE-FOUR WITH LATERAL MASS SCREWS;  Surgeon: Floyce Stakes, MD;  Location: [facility];  Service: Neurosurgery;  Laterality: N/A;  . PTOSIS REPAIR Bilateral 10/02/2016   Procedure: INTERNAL PTOSIS REPAIR;  Surgeon: Georjean Mode  Rosezella Florida, MD;  Location: [facility];  Service: Plastics;  Laterality: Bilateral;  . SKIN BIOPSY    . THYROID SURGERY     R lobe removed- 1972, has grown back - CT last done- 2018  . TOTAL HIP ARTHROPLASTY  12/22/2011   Procedure: TOTAL HIP ARTHROPLASTY;  Surgeon: Kerin Salen, MD;  Location: [facility];  Service: Orthopedics;  Laterality: Left;  . TUBAL LIGATION      There were no vitals filed for this visit.   Subjective Assessment - 06/29/17 1452    Subjective  "My butt just hurts really bad, its my piriformis nerve and it goes all the way down my leg.  Its usually in my R hip but today its my left so it goes back and fourth.  I also have terrible pains in my feet and I'm not sure if its the RA or what.      Pertinent History  Hx of C3/4 fusion and C4-7 fushion, L3-S1 fused per patient, hx of RA    How long can you sit comfortably?  sitting is better than standing     How long can you stand comfortably?  10 mins    How long  can you walk comfortably?  30-45 mins    Patient Stated Goals  "I want to get back to playing golf and dancing."     Currently in Pain?  Yes    Pain Score  6     Pain Location  Hip    Pain Orientation  Left but does have pain in both    Pain Descriptors / Indicators  Aching;Throbbing    Pain Type  Chronic pain    Pain Radiating Towards  If R hip hurt, pain radiates all the way down left leg (sciatic nerve pattern)    Pain Onset  More than a month ago    Pain Frequency  Constant    Aggravating Factors   standing, sleeping on hip    Pain Relieving Factors  nothing (pain medication)    Effect of Pain on Daily Activities  has to sit to prepare dinner         [facility] PT Assessment - 06/29/17 0001      Assessment   Medical Diagnosis  hip pain     Referring Provider  Arlice Colt, MD    Onset Date/Surgical Date  -- years, but worse in the last 6 months    Prior Therapy  had PT in the past, but has been years      Precautions   Precautions  Fall      Balance Screen   Has the patient fallen in the past 6 months  No    Has the patient had a decrease in activity level because of a fear of falling?   No    Is the patient reluctant to leave their home because of a fear of falling?   No      Home Film/video editor residence    Living Arrangements  Spouse/significant other    Available Help at Discharge  Family    Type of [place] to enter    Entrance Stairs-Number of Steps  3-4    Entrance Stairs-Rails  Right    Home Layout  One level      Prior Function   Level of Independence  Independent      Sensation   Light Touch  Impaired Detail    Light Touch Impaired Details  Impaired RLE;Impaired LLE;Impaired LUE numbness in L hand      Coordination   Gross Motor Movements are Fluid and Coordinated  Yes    Fine Motor Movements are Fluid and Coordinated  Yes      Posture/Postural Control   Posture/Postural Control  No significant  limitations      Flexibility   Soft Tissue Assessment /Muscle Length  yes    Hamstrings  normal length    Piriformis  L tighter than R      Palpation   SI assessment   No tenderness to palpation, moves evenly with forward flexion    Palpation comment  tender to palpation over R and L glute/piriformis and tenderness over coccyx       Special Tests    Special Tests  --    Other special tests  Sciatic nerve glide provoked symptoms, therefore provided for HEP             Objective measurements completed on examination: See above findings.      [facility] Adult PT Treatment/Exercise - 06/29/17 0001      Exercises   Exercises  Other Exercises    Other Exercises   Attempted to provoke symptoms with seated piriformis stretch.  Pt able to get adequate stretch in R piriformis, but not in L piriformis.  In supine, pt unable to provoke or get adequate stretch in R nor left buttock however with over pressure from therapist, she did get adequate stretch.  She does seem somewhat hypermobile when tested for  hamstring length.  No IT band tightness noted during assessment.               PT Education - 06/29/17 1708    Education provided  Yes    Education Details  evaluation findings, possible dry needling, stabilization exercises needed    Person(s) Educated  Patient    Methods  Explanation    Comprehension  Verbalized understanding          PT Long Term Goals - 06/29/17 2055      PT LONG TERM GOAL #1   Title  Pt will be independent with HEP in order to indicate reduced pain and improved functional mobility. (Target Date: 07/29/17)    Time  4    Period  Weeks    Status  New    Target Date  07/29/17      PT LONG TERM GOAL #2   Title  Pt will report no more than 4/10 pain with functional mobility in order to indicate reduced pain with ADLs.     Time  4    Period  Weeks    Status  New      PT LONG TERM GOAL #3   Title  Pt will demonstrate at least 4/5 strength in BLEs in  order to indicate improved proximal stabilization.     Time  4    Period  Weeks    Status  New      PT LONG TERM GOAL #4   Title  Pt will improve walking tolerance to 60 minutes in order to indicate return to community and leisure activity.     Time  4    Period  Weeks    Status  New      PT LONG TERM GOAL #5   Title  Pt will improve standing tolerance to 25 mins in order to indicate return to  ADLs, such as cooking, in the standing position.      Time  4    Period  Weeks    Status  New             Plan - 06/29/17 2046    Clinical Impression Statement  Pt presents with R hip pain and piriformis muscle pain that has been present for years but has recently become constant in the last 6 months.  Note that it fluctuates from R side to L side at times, but only has radiating pain in RLE.  Note history of MS, multiple level cervical spine fusion and multiple level lumbar spine fusion along with RA.  Upon PT evaluation, note increased tightness in B glutes and piriformis (L>R), tenderness to palpation over R and L gluteal/piriformis region, positive neural tension in BLEs, and poor activity tolerance.   Also note that pt has many areas of hypermobility that may be contributing to pain as well.  Pt will benefit from skilled OP neuro PT in order to address deficits.      History and Personal Factors relevant to plan of care:  see above    Clinical Presentation  Evolving    Clinical Decision Making  Moderate    Rehab Potential  Good    Clinical Impairments Affecting Rehab Potential  severity of co-morbidities    PT Frequency  2x / week    PT Duration  4 weeks will write cert for 8 weeks if additional visits needed    PT Treatment/Interventions  ADLs/Self Care Home Management;Gait training;Stair training;Functional mobility training;Therapeutic activities;Therapeutic exercise;Neuromuscular re-education;Patient/family education;Manual techniques;Passive range of motion;Dry needling    PT Next  Visit Plan  dry needling to glutes/piriformis (bilaterally?), hip stabilization exercises, nerve glides LE, check neural tension in LUE    Consulted and Agree with Plan of Care  Patient       Patient will benefit from skilled therapeutic intervention in order to improve the following deficits and impairments:  Decreased activity tolerance, Hypermobility, Impaired flexibility, Postural dysfunction, Difficulty walking, Decreased strength, Impaired perceived functional ability, Impaired sensation  Visit Diagnosis: Pain in right hip  Pain in left hip  Unspecified disturbances of skin sensation     Problem List Patient Active Problem List   Diagnosis Date Noted  . Anxiety 04/24/2017  . Mid back pain 03/26/2017  . Left sided chest pain 03/25/2017  . Dyspnea on exertion 03/14/2017  . Hyperkalemia 03/13/2017  . Rash and nonspecific skin eruption 01/08/2017  . Submandibular gland swelling 01/08/2017  . High risk medication use 09/02/2016  . Urinary frequency 09/02/2016  . Piriformis muscle pain 09/02/2016  . Fatigue 07/22/2016  . Hair loss 07/22/2016  . Right hip pain 04/08/2016  . Right knee pain 02/25/2016  . Dyspepsia 04/05/2015  . Numbness 11/14/2014  . Biceps tendon tear 10/10/2014  . Insomnia 10/10/2014  . Gait disturbance 06/13/2014  . 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 OP (osteoporosis) 01/18/2014  . Other long term (current) drug therapy 01/18/2014  . Other dysphagia 12/29/2013  . Hoarseness 12/29/2013  . 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 Personal history of colonic polyps 07/03/2010  . Nocturia 05/28/2010  . THYROID NODULE 05/17/2008  . Multiple sclerosis ([place]) 05/17/2008  . Chronic fatigue 03/30/2007  . Hyperlipidemia 08/20/2006  . Essential hypertension 08/20/2006  . Rheumatoid arthritis ([place]) 08/20/2006  . CERVICAL CANCER, HX OF 08/20/2006  . GASTRIC ULCER, HX OF 08/20/2006    Cameron Sprang, PT, MPT [facility]  [facility] [address] [place], [place], [address] Phone: [phone]   Fax:  [phone] 06/29/17, 9:05 PM  Name: Sherri [name] MRN: [id number] Date of Birth: [birth year]

## 2017-07-01 DIAGNOSIS — L570 Actinic keratosis: Secondary | ICD-10-CM | POA: Diagnosis not present

## 2017-07-01 DIAGNOSIS — D692 Other nonthrombocytopenic purpura: Secondary | ICD-10-CM | POA: Diagnosis not present

## 2017-07-02 DIAGNOSIS — H5211 Myopia, right eye: Secondary | ICD-10-CM | POA: Diagnosis not present

## 2017-07-02 DIAGNOSIS — Z961 Presence of intraocular lens: Secondary | ICD-10-CM | POA: Diagnosis not present

## 2017-07-02 DIAGNOSIS — H04123 Dry eye syndrome of bilateral lacrimal glands: Secondary | ICD-10-CM | POA: Diagnosis not present

## 2017-07-08 ENCOUNTER — Ambulatory Visit: Payer: Medicare Other | Admitting: Physical Therapy

## 2017-07-08 ENCOUNTER — Encounter: Payer: Self-pay | Admitting: Physical Therapy

## 2017-07-08 DIAGNOSIS — M25551 Pain in right hip: Secondary | ICD-10-CM

## 2017-07-08 DIAGNOSIS — M25552 Pain in left hip: Secondary | ICD-10-CM

## 2017-07-08 DIAGNOSIS — R209 Unspecified disturbances of skin sensation: Secondary | ICD-10-CM

## 2017-07-08 NOTE — Therapy (Signed)
[facility] [address] [place], [place], [address] Phone: [phone]   Fax:  [phone]  Physical Therapy Treatment  Patient Details  Name: Sherri [name] MRN: [id number] Date of Birth: 1940-07-02 Referring Provider: Arlice Colt, MD   Encounter Date: 07/08/2017  PT End of Session - 07/08/17 1535    Visit Number  2    Number of Visits  9    Date for PT Re-Evaluation  08/28/17    Authorization Type  UHC Medicare    PT Start Time  4332    PT Stop Time  1540 hot pack    PT Time Calculation (min)  55 min    Activity Tolerance  Patient tolerated treatment well    Behavior During Therapy  [facility] for tasks assessed/performed       Past Medical History:  Diagnosis Date  . Anxiety    takes Valium daily as needed  . Bruises easily    d/t meds  . Cancer ([place])    basal cell ca, in situ- uterine   . Cataracts, bilateral   . Chronic back pain   . Dizziness    r/t to meds  . GERD (gastroesophageal reflux disease)    no meds on a regular basis but will take Tums if needed  . Headache(784.0)    r/t neck issues  . History of bronchitis 6-27yrs ago  . History of colon polyps    benign  . Hyperlipidemia    takes Atorvastatin on Mondays and Fridays  . Hypertension    takes Ramipril daily  . Joint pain   . Joint swelling   . Multiple sclerosis ([place])   . Neuromuscular disorder ([place])    Dr. Park Liter- [facility], follows M.S.  . Nocturia   . OA (osteoarthritis)    Dr Jani Gravel weekly, RA- hands- knees- feet   . PONV (postoperative nausea and vomiting)    trouble urinating after surgery in 2014  . Rheumatoid arthritis(714.0)    Dr Estanislado Pandy takes [name] daily  . Skin cancer   . Urinary retention    sees Dr.Wrenn about 2 times a yr    Past Surgical History:  Procedure Laterality Date  . ABDOMINAL HYSTERECTOMY     1985  . APPENDECTOMY     with TAH  . BACK SURGERY     several  . BROW LIFT  Bilateral 10/02/2016   Procedure: BILATERAL LOWER LID BLEPHAROPLASTY;  Surgeon: Clista Bernhardt, MD;  Location: [facility];  Service: Plastics;  Laterality: Bilateral;  . CARPAL TUNNEL RELEASE Right   . cataracts    . CERVICAL FUSION     Dr Joya Salm  . CERVICAL FUSION  12/31/2012   Dr Joya Salm  . CHEST TUBE INSERTION     for traumatic Pneumothorax  . COLONOSCOPY  07/16/2010   normal   . COLONOSCOPY W/ POLYPECTOMY  1997   negative since; Dr Deatra Ina  . ESOPHAGOGASTRODUODENOSCOPY  07/16/2010   normal  . eye lid raise    . EYE SURGERY Bilateral    cataracts removed - /w IOL  . LUMBAR FUSION     Dr Joya Salm  . NASAL SINUS SURGERY    . POSTERIOR CERVICAL FUSION/FORAMINOTOMY N/A 12/31/2012   Procedure: POSTERIOR LATERAL CERVICAL FUSION/FORAMINOTOMY LEVEL 1 CERVICAL THREE-FOUR WITH LATERAL MASS SCREWS;  Surgeon: Floyce Stakes, MD;  Location: [facility];  Service: Neurosurgery;  Laterality: N/A;  . PTOSIS REPAIR Bilateral 10/02/2016   Procedure: INTERNAL PTOSIS REPAIR;  Surgeon:  Clista Bernhardt, MD;  Location: [facility];  Service: Plastics;  Laterality: Bilateral;  . SKIN BIOPSY    . THYROID SURGERY     R lobe removed- 1972, has grown back - CT last done- 2018  . TOTAL HIP ARTHROPLASTY  12/22/2011   Procedure: TOTAL HIP ARTHROPLASTY;  Surgeon: Kerin Salen, MD;  Location: [facility];  Service: Orthopedics;  Laterality: Left;  . TUBAL LIGATION      There were no vitals filed for this visit.  Subjective Assessment - 07/08/17 1446    Subjective  doing well; "my hips just hurt."  everything is about the same.    Patient Stated Goals  "I want to get back to playing golf and dancing."     Currently in Pain?  Yes    Pain Score  5     Pain Location  Hip    Pain Orientation  Left;Right Lt>Rt    Pain Descriptors / Indicators  Aching;Throbbing    Pain Type  Chronic pain    Pain Onset  More than a month ago    Pain Frequency  Constant    Aggravating Factors   standing, sleeping on hip    Pain Relieving  Factors  nothing (pain medication)                      [facility] Adult PT Treatment/Exercise - 07/08/17 1451      Exercises   Exercises  Knee/Hip      Knee/Hip Exercises: Aerobic   Nustep  L5 x 6 min; 4 extremities; for warm up      Modalities   Modalities  Moist Heat      Moist Heat Therapy   Number Minutes Moist Heat  15 Minutes    Moist Heat Location  Hip;Lumbar Spine      Manual Therapy   Manual Therapy  Soft tissue mobilization;Myofascial release    Manual therapy comments  pt prone (multiple pillows needed to accommodate back    Soft tissue mobilization  Lt glutes and piriformis    Myofascial Release  Lt glutes and piriformis       Trigger Point Dry Needling - 07/08/17 1533    Consent Given?  Yes    Education Handout Provided  Yes    Muscles Treated Lower Body  Gluteus maximus;Piriformis and gluteus medius    Gluteus Maximus Response  Twitch response elicited;Palpable increased muscle length    Piriformis Response  Twitch response elicited;Palpable increased muscle length           PT Education - 07/08/17 1535    Education provided  Yes    Education Details  dry needling    Person(s) Educated  Patient    Methods  Explanation;Handout    Comprehension  Verbalized understanding          PT Long Term Goals - 06/29/17 2055      PT LONG TERM GOAL #1   Title  Pt will be independent with HEP in order to indicate reduced pain and improved functional mobility. (Target Date: 07/29/17)    Time  4    Period  Weeks    Status  New    Target Date  07/29/17      PT LONG TERM GOAL #2   Title  Pt will report no more than 4/10 pain with functional mobility in order to indicate reduced pain with ADLs.     Time  4    Period  Weeks    Status  New      PT LONG TERM GOAL #3   Title  Pt will demonstrate at least 4/5 strength in BLEs in order to indicate improved proximal stabilization.     Time  4    Period  Weeks    Status  New      PT LONG TERM GOAL #4    Title  Pt will improve walking tolerance to 60 minutes in order to indicate return to community and leisure activity.     Time  4    Period  Weeks    Status  New      PT LONG TERM GOAL #5   Title  Pt will improve standing tolerance to 25 mins in order to indicate return to ADLs, such as cooking, in the standing position.      Time  4    Period  Weeks    Status  New            Plan - 07/08/17 1536    Clinical Impression Statement  Pt tolerated DN well today with twitch responses noted in all muscle groups and decreased pain following session.  Recommended use of tennis ball for self myofascial release, and pt reports she was doing this years ago, but has since stopped.  She reports she will begin doing that again.  Will continue to benefit from PT to maximize function.    PT Treatment/Interventions  ADLs/Self Care Home Management;Gait training;Stair training;Functional mobility training;Therapeutic activities;Therapeutic exercise;Neuromuscular re-education;Patient/family education;Manual techniques;Passive range of motion;Dry needling    PT Next Visit Plan  assess response to DN (Lt side only perfomed today), hip stabilization exercises, nerve glides    Consulted and Agree with Plan of Care  Patient       Patient will benefit from skilled therapeutic intervention in order to improve the following deficits and impairments:  Decreased activity tolerance, Hypermobility, Impaired flexibility, Postural dysfunction, Difficulty walking, Decreased strength, Impaired perceived functional ability, Impaired sensation  Visit Diagnosis: Pain in right hip  Pain in left hip  Unspecified disturbances of skin sensation     Problem List Patient Active Problem List   Diagnosis Date Noted  . Anxiety 04/24/2017  . Mid back pain 03/26/2017  . Left sided chest pain 03/25/2017  . Dyspnea on exertion 03/14/2017  . Hyperkalemia 03/13/2017  . Rash and nonspecific skin eruption 01/08/2017  .  Submandibular gland swelling 01/08/2017  . High risk medication use 09/02/2016  . Urinary frequency 09/02/2016  . Piriformis muscle pain 09/02/2016  . Fatigue 07/22/2016  . Hair loss 07/22/2016  . Right hip pain 04/08/2016  . Right knee pain 02/25/2016  . Dyspepsia 04/05/2015  . Numbness 11/14/2014  . Biceps tendon tear 10/10/2014  . Insomnia 10/10/2014  . Gait disturbance 06/13/2014  . 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 OP (osteoporosis) 01/18/2014  . Other long term (current) drug therapy 01/18/2014  . Other dysphagia 12/29/2013  . Hoarseness 12/29/2013  . 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 Personal history of colonic polyps 07/03/2010  . Nocturia 05/28/2010  . THYROID NODULE 05/17/2008  . Multiple sclerosis ([place]) 05/17/2008  . Chronic fatigue 03/30/2007  . Hyperlipidemia 08/20/2006  . Essential hypertension 08/20/2006  . Rheumatoid arthritis ([place]) 08/20/2006  . CERVICAL CANCER, HX OF 08/20/2006  . GASTRIC ULCER, HX OF 08/20/2006      Laureen Abrahams, PT, DPT 07/08/17 3:38 PM    [facility] [address]  [place], [place], [address] Phone: [phone]   Fax:  [phone]  Name: Sherri [name] MRN: [id number] Date of Birth: 1940-08-23

## 2017-07-08 NOTE — Patient Instructions (Signed)

## 2017-07-09 NOTE — Addendum Note (Signed)
Addended by: Cameron Sprang A on: 07/09/2017 12:58 PM   Modules accepted: Orders

## 2017-07-10 ENCOUNTER — Encounter: Payer: Self-pay | Admitting: Rehabilitation

## 2017-07-10 ENCOUNTER — Ambulatory Visit: Payer: Medicare Other | Admitting: Rehabilitation

## 2017-07-10 DIAGNOSIS — R209 Unspecified disturbances of skin sensation: Secondary | ICD-10-CM | POA: Diagnosis not present

## 2017-07-10 DIAGNOSIS — M25551 Pain in right hip: Secondary | ICD-10-CM

## 2017-07-10 DIAGNOSIS — M25552 Pain in left hip: Secondary | ICD-10-CM

## 2017-07-10 NOTE — Patient Instructions (Addendum)
   BRIDGING ELASTIC BAND ABDUCTION  While lying on your back, place an elastic band around your knees and pull your knees apart. Hold this and then tighten your lower abdominals, squeeze your buttocks and raise your buttocks off the floor/bed as creating a "Bridge" with your body.  Perform x 10 reps.  Keep your arms off mat (either clasped over chest or on cat : ) to avoid UE use.  Do them slowly and focus on control.        PLANK  While lying face down, lift your body up on your elbows and toes. Try and maintain a straight spine. Do not allow your hips or pelvis on either side to drop. Maintain pelvic neutral position the entire time. Maintain neutral spine.  Hold for 5 secs at a time.  Do 8-10 reps.        Begin on your back with the knees and hips bent to 90 degrees. Use a dowel or broomstick to go through the legs. Have the dowel behind the left leg and in front of the right. Stabilize the dowel on ether side with the hands. Press down with the left leg and up with the right and hold for 5 seconds. Slowly release back to the starting position. Do 5 reps on each side.

## 2017-07-11 NOTE — Therapy (Signed)
[facility] [address] [place], [place], [address] Phone: [phone]   Fax:  [phone]  Physical Therapy Treatment  Patient Details  Name: Sherri [name] MRN: [id number] Date of Birth: [birth year] Referring Provider: Arlice Colt, MD   Encounter Date: 07/10/2017  PT End of Session - 07/11/17 0717    Visit Number  3    Number of Visits  9    Date for PT Re-Evaluation  08/28/17    Authorization Type  [employer] Medicare    PT Start Time  1447    PT Stop Time  1530    PT Time Calculation (min)  43 min    Activity Tolerance  Patient tolerated treatment well    Behavior During Therapy  [facility] for tasks assessed/performed       Past Medical History:  Diagnosis Date  . Anxiety    takes Valium daily as needed  . Bruises easily    d/t meds  . Cancer ([place])    basal cell ca, in situ- uterine   . Cataracts, bilateral   . Chronic back pain   . Dizziness    r/t to meds  . GERD (gastroesophageal reflux disease)    no meds on a regular basis but will take Tums if needed  . Headache(784.0)    r/t neck issues  . History of bronchitis 6-39yrs ago  . History of colon polyps    benign  . Hyperlipidemia    takes Atorvastatin on Mondays and Fridays  . Hypertension    takes Ramipril daily  . Joint pain   . Joint swelling   . Multiple sclerosis ([place])   . Neuromuscular disorder ([place])    Dr. Park Liter- [facility], follows M.S.  . Nocturia   . OA (osteoarthritis)    Dr Jani Gravel weekly, RA- hands- knees- feet   . PONV (postoperative nausea and vomiting)    trouble urinating after surgery in 2014  . Rheumatoid arthritis(714.0)    Dr Estanislado Pandy takes [name] daily  . Skin cancer   . Urinary retention    sees Dr.Wrenn about 2 times a yr    Past Surgical History:  Procedure Laterality Date  . ABDOMINAL HYSTERECTOMY     1985  . APPENDECTOMY     with TAH  . BACK SURGERY     several  . BROW LIFT Bilateral  10/02/2016   Procedure: BILATERAL LOWER LID BLEPHAROPLASTY;  Surgeon: Clista Bernhardt, MD;  Location: [facility];  Service: Plastics;  Laterality: Bilateral;  . CARPAL TUNNEL RELEASE Right   . cataracts    . CERVICAL FUSION     Dr Joya Salm  . CERVICAL FUSION  12/31/2012   Dr Joya Salm  . CHEST TUBE INSERTION     for traumatic Pneumothorax  . COLONOSCOPY  07/16/2010   normal   . COLONOSCOPY W/ POLYPECTOMY  1997   negative since; Dr Deatra Ina  . ESOPHAGOGASTRODUODENOSCOPY  07/16/2010   normal  . eye lid raise    . EYE SURGERY Bilateral    cataracts removed - /w IOL  . LUMBAR FUSION     Dr Joya Salm  . NASAL SINUS SURGERY    . POSTERIOR CERVICAL FUSION/FORAMINOTOMY N/A 12/31/2012   Procedure: POSTERIOR LATERAL CERVICAL FUSION/FORAMINOTOMY LEVEL 1 CERVICAL THREE-FOUR WITH LATERAL MASS SCREWS;  Surgeon: Floyce Stakes, MD;  Location: [facility];  Service: Neurosurgery;  Laterality: N/A;  . PTOSIS REPAIR Bilateral 10/02/2016   Procedure: INTERNAL PTOSIS REPAIR;  Surgeon: Georjean Mode  Rosezella Florida, MD;  Location: [facility];  Service: Plastics;  Laterality: Bilateral;  . SKIN BIOPSY    . THYROID SURGERY     R lobe removed- 1972, has grown back - CT last done- 2018  . TOTAL HIP ARTHROPLASTY  12/22/2011   Procedure: TOTAL HIP ARTHROPLASTY;  Surgeon: Kerin Salen, MD;  Location: [facility];  Service: Orthopedics;  Laterality: Left;  . TUBAL LIGATION      There were no vitals filed for this visit.  Subjective Assessment - 07/10/17 1452    Subjective  That needle hurt so bad.  I hurt that evening, but felt better the next day, today is somewhat better, but pain is still there.      Pertinent History  Hx of C3/4 fusion and C4-7 fushion, L3-S1 fused per patient, hx of RA    How long can you sit comfortably?  sitting is better than standing     How long can you stand comfortably?  10 mins    How long can you walk comfortably?  30-45 mins    Patient Stated Goals  "I want to get back to playing golf and dancing."      Currently in Pain?  Yes    Pain Score  5     Pain Location  Hip    Pain Orientation  Left;Right more left    Pain Descriptors / Indicators  Aching    Pain Type  Chronic pain    Pain Onset  More than a month ago    Pain Frequency  Constant    Aggravating Factors   standing and sleeping on hip    Pain Relieving Factors  nothing, deep tissue work.                       [facility] Adult PT Treatment/Exercise - 07/11/17 0001      Exercises   Other Exercises   Core and proximal hip activation/stabilization with hip adduction (squeezing small ball) while elevating legs off mat x 10 reps.  Progressed to resisted (isometric) hip abd with BLE bridging x 10 reps (also did this x 10 reps with use of theraband so that she oculd do this at home).  BLE bridging with LEs on ball x 10 reps progressing to BLE bridge w/ hamstring curl with use of ball x 10 reps (arms clasped over chest as able).  Prone plank on elbow x 7 reps with 5 sec holds with cues for neutral spine.  Attempted modified side plank, however this caused discomfort in arm and neck, therefore discontinued.  Performed isometric hip flex with opposite LE hip ext with use of dowel x 3 reps with 5 sec hold each direction.  See pt instruction for exercises added to HEP.  Note marked difficulty with bridging when pt asked to clasp arms over chest to reduce UE support.        Manual Therapy   Manual Therapy  Soft tissue mobilization;Myofascial release    Manual therapy comments  pt prone (multiple pillows needed to accommodate back    Soft tissue mobilization  Lt glutes and piriformis    Myofascial Release  Lt glutes and piriformis             PT Education - 07/11/17 0717    Education provided  Yes    Education Details  exercises for HEP    Person(s) Educated  Patient    Methods  Explanation;Demonstration;Handout    Comprehension  Verbalized  understanding;Returned demonstration          PT Long Term Goals - 06/29/17 2055       PT LONG TERM GOAL #1   Title  Pt will be independent with HEP in order to indicate reduced pain and improved functional mobility. (Target Date: 07/29/17)    Time  4    Period  Weeks    Status  New    Target Date  07/29/17      PT LONG TERM GOAL #2   Title  Pt will report no more than 4/10 pain with functional mobility in order to indicate reduced pain with ADLs.     Time  4    Period  Weeks    Status  New      PT LONG TERM GOAL #3   Title  Pt will demonstrate at least 4/5 strength in BLEs in order to indicate improved proximal stabilization.     Time  4    Period  Weeks    Status  New      PT LONG TERM GOAL #4   Title  Pt will improve walking tolerance to 60 minutes in order to indicate return to community and leisure activity.     Time  4    Period  Weeks    Status  New      PT LONG TERM GOAL #5   Title  Pt will improve standing tolerance to 25 mins in order to indicate return to ADLs, such as cooking, in the standing position.      Time  4    Period  Weeks    Status  New            Plan - 07/11/17 7829    Clinical Impression Statement  Skilled session focused on soft tissue mobilization and myofascial release to B glutes and piriformis.  Also initiated HEP for core and hip activation/stabilization.  Note difficulty with many exercises.  Pt continued to report some pain following session.      PT Treatment/Interventions  ADLs/Self Care Home Management;Gait training;Stair training;Functional mobility training;Therapeutic activities;Therapeutic exercise;Neuromuscular re-education;Patient/family education;Manual techniques;Passive range of motion;Dry needling    PT Next Visit Plan  Steph she said that the needle hurt pretty bad, but that she did get some relief, however it seemed that with each day, pain goes up slightly, hip stabilization exercises, nerve glides    Consulted and Agree with Plan of Care  Patient       Patient will benefit from skilled therapeutic  intervention in order to improve the following deficits and impairments:  Decreased activity tolerance, Hypermobility, Impaired flexibility, Postural dysfunction, Difficulty walking, Decreased strength, Impaired perceived functional ability, Impaired sensation  Visit Diagnosis: Pain in right hip  Pain in left hip  Unspecified disturbances of skin sensation     Problem List Patient Active Problem List   Diagnosis Date Noted  . Anxiety 04/24/2017  . Mid back pain 03/26/2017  . Left sided chest pain 03/25/2017  . Dyspnea on exertion 03/14/2017  . Hyperkalemia 03/13/2017  . Rash and nonspecific skin eruption 01/08/2017  . Submandibular gland swelling 01/08/2017  . High risk medication use 09/02/2016  . Urinary frequency 09/02/2016  . Piriformis muscle pain 09/02/2016  . Fatigue 07/22/2016  . Hair loss 07/22/2016  . Right hip pain 04/08/2016  . Right knee pain 02/25/2016  . Dyspepsia 04/05/2015  . Numbness 11/14/2014  . Biceps tendon tear 10/10/2014  . Insomnia 10/10/2014  . Gait disturbance  06/13/2014  . 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 OP (osteoporosis) 01/18/2014  . Other long term (current) drug therapy 01/18/2014  . Other dysphagia 12/29/2013  . Hoarseness 12/29/2013  . 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 Personal history of colonic polyps 07/03/2010  . Nocturia 05/28/2010  . THYROID NODULE 05/17/2008  . Multiple sclerosis ([place]) 05/17/2008  . Chronic fatigue 03/30/2007  . Hyperlipidemia 08/20/2006  . Essential hypertension 08/20/2006  . Rheumatoid arthritis ([place]) 08/20/2006  . CERVICAL CANCER, HX OF 08/20/2006  . GASTRIC ULCER, HX OF 08/20/2006    Cameron Sprang, PT, MPT [facility] [address] [place], [place], [address] Phone: [phone]   Fax:  [phone] 07/11/17, 7:22 AM  Name: Sherri [name] MRN: [id number] Date of Birth: 1941-01-12

## 2017-07-13 ENCOUNTER — Ambulatory Visit: Payer: Medicare Other | Admitting: Physical Therapy

## 2017-07-13 ENCOUNTER — Encounter: Payer: Self-pay | Admitting: Physical Therapy

## 2017-07-13 DIAGNOSIS — M25552 Pain in left hip: Secondary | ICD-10-CM

## 2017-07-13 DIAGNOSIS — R209 Unspecified disturbances of skin sensation: Secondary | ICD-10-CM

## 2017-07-13 DIAGNOSIS — M25551 Pain in right hip: Secondary | ICD-10-CM

## 2017-07-13 NOTE — Therapy (Signed)
[facility] [address] [place], [place], [address] Phone: [phone]   Fax:  [phone]  Physical Therapy Treatment  Patient Details  Name: Sherri [name] MRN: [id number] Date of Birth: 1941/05/06 Referring Provider: Arlice Colt, MD   Encounter Date: 07/13/2017  PT End of Session - 07/13/17 1634    Visit Number  4    Number of Visits  9    Date for PT Re-Evaluation  08/28/17    Authorization Type  UHC Medicare    PT Start Time  1440    PT Stop Time  1528    PT Time Calculation (min)  48 min    Activity Tolerance  Patient tolerated treatment well    Behavior During Therapy  [facility] for tasks assessed/performed       Past Medical History:  Diagnosis Date  . Anxiety    takes Valium daily as needed  . Bruises easily    d/t meds  . Cancer ([place])    basal cell ca, in situ- uterine   . Cataracts, bilateral   . Chronic back pain   . Dizziness    r/t to meds  . GERD (gastroesophageal reflux disease)    no meds on a regular basis but will take Tums if needed  . Headache(784.0)    r/t neck issues  . History of bronchitis 6-47yrs ago  . History of colon polyps    benign  . Hyperlipidemia    takes Atorvastatin on Mondays and Fridays  . Hypertension    takes Ramipril daily  . Joint pain   . Joint swelling   . Multiple sclerosis ([place])   . Neuromuscular disorder ([place])    Dr. Park Liter- [facility], follows M.S.  . Nocturia   . OA (osteoarthritis)    Dr Jani Gravel weekly, RA- hands- knees- feet   . PONV (postoperative nausea and vomiting)    trouble urinating after surgery in 2014  . Rheumatoid arthritis(714.0)    Dr Estanislado Pandy takes [name] daily  . Skin cancer   . Urinary retention    sees Dr.Wrenn about 2 times a yr    Past Surgical History:  Procedure Laterality Date  . ABDOMINAL HYSTERECTOMY     1985  . APPENDECTOMY     with TAH  . BACK SURGERY     several  . BROW LIFT Bilateral  10/02/2016   Procedure: BILATERAL LOWER LID BLEPHAROPLASTY;  Surgeon: Clista Bernhardt, MD;  Location: [facility];  Service: Plastics;  Laterality: Bilateral;  . CARPAL TUNNEL RELEASE Right   . cataracts    . CERVICAL FUSION     Dr Joya Salm  . CERVICAL FUSION  12/31/2012   Dr Joya Salm  . CHEST TUBE INSERTION     for traumatic Pneumothorax  . COLONOSCOPY  07/16/2010   normal   . COLONOSCOPY W/ POLYPECTOMY  1997   negative since; Dr Deatra Ina  . ESOPHAGOGASTRODUODENOSCOPY  07/16/2010   normal  . eye lid raise    . EYE SURGERY Bilateral    cataracts removed - /w IOL  . LUMBAR FUSION     Dr Joya Salm  . NASAL SINUS SURGERY    . POSTERIOR CERVICAL FUSION/FORAMINOTOMY N/A 12/31/2012   Procedure: POSTERIOR LATERAL CERVICAL FUSION/FORAMINOTOMY LEVEL 1 CERVICAL THREE-FOUR WITH LATERAL MASS SCREWS;  Surgeon: Floyce Stakes, MD;  Location: [facility];  Service: Neurosurgery;  Laterality: N/A;  . PTOSIS REPAIR Bilateral 10/02/2016   Procedure: INTERNAL PTOSIS REPAIR;  Surgeon: Georjean Mode  Rosezella Florida, MD;  Location: [facility];  Service: Plastics;  Laterality: Bilateral;  . SKIN BIOPSY    . THYROID SURGERY     R lobe removed- 1972, has grown back - CT last done- 2018  . TOTAL HIP ARTHROPLASTY  12/22/2011   Procedure: TOTAL HIP ARTHROPLASTY;  Surgeon: Kerin Salen, MD;  Location: [facility];  Service: Orthopedics;  Laterality: Left;  . TUBAL LIGATION      There were no vitals filed for this visit.  Subjective Assessment - 07/13/17 1443    Subjective  having increased pain today; did exercises over weekend and thinks that may have triggered symptoms.  described as constant ache.    Patient Stated Goals  "I want to get back to playing golf and dancing."     Currently in Pain?  Yes    Pain Score  8     Pain Location  [place]    Pain Orientation  Left;Right    Pain Descriptors / Indicators  Dull;Aching    Pain Type  Chronic pain    Pain Onset  More than a month ago    Pain Frequency  Constant    Aggravating Factors    standing and sleeping on hip    Pain Relieving Factors  nothing, deep tissue work         [facility] PT Assessment - 07/13/17 1528      Palpation   SI assessment   Rt PSIS and iliac crest slightly elevated                  [facility] Adult PT Treatment/Exercise - 07/13/17 1445      Knee/Hip Exercises: Aerobic   Nustep  L5 x 8 min for warm up      Manual Therapy   Manual Therapy  Soft tissue mobilization;Myofascial release    Manual therapy comments  pt prone (multiple pillows needed to accommodate back    Soft tissue mobilization  bil glutes and piriformis    Myofascial Release  bil glutes and piriformis       Trigger Point Dry Needling - 07/13/17 1528    Consent Given?  Yes    Muscles Treated Lower Body  Gluteus maximus;Piriformis bil    Gluteus Maximus Response  Twitch response elicited;Palpable increased muscle length    Piriformis Response  Twitch response elicited;Palpable increased muscle length                PT Long Term Goals - 06/29/17 2055      PT LONG TERM GOAL #1   Title  Pt will be independent with HEP in order to indicate reduced pain and improved functional mobility. (Target Date: 07/29/17)    Time  4    Period  Weeks    Status  New    Target Date  07/29/17      PT LONG TERM GOAL #2   Title  Pt will report no more than 4/10 pain with functional mobility in order to indicate reduced pain with ADLs.     Time  4    Period  Weeks    Status  New      PT LONG TERM GOAL #3   Title  Pt will demonstrate at least 4/5 strength in BLEs in order to indicate improved proximal stabilization.     Time  4    Period  Weeks    Status  New      PT LONG TERM GOAL #4   Title  Pt will improve walking tolerance to 60 minutes in order to indicate return to community and leisure activity.     Time  4    Period  Weeks    Status  New      PT LONG TERM GOAL #5   Title  Pt will improve standing tolerance to 25 mins in order to indicate return to ADLs, such as  cooking, in the standing position.      Time  4    Period  Weeks    Status  New            Plan - 07/13/17 1635    Clinical Impression Statement  Pt tolerated DN well today with twitch responses noted in all muscles but pt reports less intense from previous session.  Will continue to benefit from PT to maximize function.  May benefit from LAD to Rt hip followed by stabilization exercises to help with pain.    PT Treatment/Interventions  ADLs/Self Care Home Management;Gait training;Stair training;Functional mobility training;Therapeutic activities;Therapeutic exercise;Neuromuscular re-education;Patient/family education;Manual techniques;Passive range of motion;Dry needling    PT Next Visit Plan  assess response to DN, try LAD to Rt hip followed by stabilization, DN/manual/modalities PRN    Consulted and Agree with Plan of Care  Patient       Patient will benefit from skilled therapeutic intervention in order to improve the following deficits and impairments:  Decreased activity tolerance, Hypermobility, Impaired flexibility, Postural dysfunction, Difficulty walking, Decreased strength, Impaired perceived functional ability, Impaired sensation  Visit Diagnosis: Pain in right hip  Pain in left hip  Unspecified disturbances of skin sensation     Problem List Patient Active Problem List   Diagnosis Date Noted  . Anxiety 04/24/2017  . Mid back pain 03/26/2017  . Left sided chest pain 03/25/2017  . Dyspnea on exertion 03/14/2017  . Hyperkalemia 03/13/2017  . Rash and nonspecific skin eruption 01/08/2017  . Submandibular gland swelling 01/08/2017  . High risk medication use 09/02/2016  . Urinary frequency 09/02/2016  . Piriformis muscle pain 09/02/2016  . Fatigue 07/22/2016  . Hair loss 07/22/2016  . Right hip pain 04/08/2016  . Right knee pain 02/25/2016  . Dyspepsia 04/05/2015  . Numbness 11/14/2014  . Biceps tendon tear 10/10/2014  . Insomnia 10/10/2014  . Gait  disturbance 06/13/2014  . 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 OP (osteoporosis) 01/18/2014  . Other long term (current) drug therapy 01/18/2014  . Other dysphagia 12/29/2013  . Hoarseness 12/29/2013  . 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 Personal history of colonic polyps 07/03/2010  . Nocturia 05/28/2010  . THYROID NODULE 05/17/2008  . Multiple sclerosis ([place]) 05/17/2008  . Chronic fatigue 03/30/2007  . Hyperlipidemia 08/20/2006  . Essential hypertension 08/20/2006  . Rheumatoid arthritis ([place]) 08/20/2006  . CERVICAL CANCER, HX OF 08/20/2006  . GASTRIC ULCER, HX OF 08/20/2006      Laureen Abrahams, PT, DPT 07/13/17 4:37 PM    [facility] [address] [place], [place], [address] Phone: [phone]   Fax:  [phone]  Name: ENEDINA PAIR MRN: [id number] Date of Birth: May 23, 1941

## 2017-07-15 ENCOUNTER — Other Ambulatory Visit: Payer: Self-pay | Admitting: Emergency Medicine

## 2017-07-15 ENCOUNTER — Ambulatory Visit: Payer: Medicare Other | Admitting: Physical Therapy

## 2017-07-15 MED ORDER — CARVEDILOL 6.25 MG PO TABS: 6.2500 mg | ORAL_TABLET | Freq: Two times a day (BID) | ORAL | 0 refills | Status: DC

## 2017-07-20 ENCOUNTER — Ambulatory Visit: Payer: Medicare Other | Admitting: Physical Therapy

## 2017-07-20 ENCOUNTER — Encounter: Payer: Self-pay | Admitting: Physical Therapy

## 2017-07-20 DIAGNOSIS — M25552 Pain in left hip: Secondary | ICD-10-CM | POA: Diagnosis not present

## 2017-07-20 DIAGNOSIS — M25551 Pain in right hip: Secondary | ICD-10-CM | POA: Diagnosis not present

## 2017-07-20 DIAGNOSIS — R209 Unspecified disturbances of skin sensation: Secondary | ICD-10-CM | POA: Diagnosis not present

## 2017-07-20 NOTE — Therapy (Signed)
[facility] [address] [place], [place], [address] Phone: [phone]   Fax:  [phone]  Physical Therapy Treatment  Patient Details  Name: Sherri [name] MRN: [id number] Date of Birth: 08-01-1940 Referring Provider: Arlice Colt, MD   Encounter Date: 07/20/2017  PT End of Session - 07/20/17 1529    Visit Number  5    Number of Visits  9    Date for PT Re-Evaluation  08/28/17    Authorization Type  [employer] Medicare    PT Start Time  9323    PT Stop Time  1527    PT Time Calculation (min)  40 min    Activity Tolerance  Patient tolerated treatment well    Behavior During Therapy  [facility] for tasks assessed/performed       Past Medical History:  Diagnosis Date  . Anxiety    takes Valium daily as needed  . Bruises easily    d/t meds  . Cancer ([place])    basal cell ca, in situ- uterine   . Cataracts, bilateral   . Chronic back pain   . Dizziness    r/t to meds  . GERD (gastroesophageal reflux disease)    no meds on a regular basis but will take Tums if needed  . Headache(784.0)    r/t neck issues  . History of bronchitis 6-60yrs ago  . History of colon polyps    benign  . Hyperlipidemia    takes Atorvastatin on Mondays and Fridays  . Hypertension    takes Ramipril daily  . Joint pain   . Joint swelling   . Multiple sclerosis ([place])   . Neuromuscular disorder ([place])    Dr. Park Liter- [facility], follows M.S.  . Nocturia   . OA (osteoarthritis)    Dr Jani Gravel weekly, RA- hands- knees- feet   . PONV (postoperative nausea and vomiting)    trouble urinating after surgery in 2014  . Rheumatoid arthritis(714.0)    Dr Estanislado Pandy takes [name] daily  . Skin cancer   . Urinary retention    sees Dr.Wrenn about 2 times a yr    Past Surgical History:  Procedure Laterality Date  . ABDOMINAL HYSTERECTOMY     1985  . APPENDECTOMY     with TAH  . BACK SURGERY     several  . BROW LIFT Bilateral  10/02/2016   Procedure: BILATERAL LOWER LID BLEPHAROPLASTY;  Surgeon: Clista Bernhardt, MD;  Location: [facility];  Service: Plastics;  Laterality: Bilateral;  . CARPAL TUNNEL RELEASE Right   . cataracts    . CERVICAL FUSION     Dr Joya Salm  . CERVICAL FUSION  12/31/2012   Dr Joya Salm  . CHEST TUBE INSERTION     for traumatic Pneumothorax  . COLONOSCOPY  07/16/2010   normal   . COLONOSCOPY W/ POLYPECTOMY  1997   negative since; Dr Deatra Ina  . ESOPHAGOGASTRODUODENOSCOPY  07/16/2010   normal  . eye lid raise    . EYE SURGERY Bilateral    cataracts removed - /w IOL  . LUMBAR FUSION     Dr Joya Salm  . NASAL SINUS SURGERY    . POSTERIOR CERVICAL FUSION/FORAMINOTOMY N/A 12/31/2012   Procedure: POSTERIOR LATERAL CERVICAL FUSION/FORAMINOTOMY LEVEL 1 CERVICAL THREE-FOUR WITH LATERAL MASS SCREWS;  Surgeon: Floyce Stakes, MD;  Location: [facility];  Service: Neurosurgery;  Laterality: N/A;  . PTOSIS REPAIR Bilateral 10/02/2016   Procedure: INTERNAL PTOSIS REPAIR;  Surgeon: Georjean Mode  Rosezella Florida, MD;  Location: [facility];  Service: Plastics;  Laterality: Bilateral;  . SKIN BIOPSY    . THYROID SURGERY     R lobe removed- 1972, has grown back - CT last done- 2018  . TOTAL HIP ARTHROPLASTY  12/22/2011   Procedure: TOTAL HIP ARTHROPLASTY;  Surgeon: Kerin Salen, MD;  Location: [facility];  Service: Orthopedics;  Laterality: Left;  . TUBAL LIGATION      There were no vitals filed for this visit.  Subjective Assessment - 07/20/17 1450    Subjective  the DN helped until yesterday and then noticed tightness returning; not as intense as it has been.    Patient Stated Goals  "I want to get back to playing golf and dancing."     Currently in Pain?  Yes    Pain Score  6     Pain Location  Sacrum    Pain Orientation  Left;Right    Pain Descriptors / Indicators  Dull;Aching    Pain Type  Chronic pain    Pain Onset  More than a month ago    Pain Frequency  Constant    Aggravating Factors   standing and sleeping on hip     Pain Relieving Factors  deep tissue work, DN                      [facility] Adult PT Treatment/Exercise - 07/20/17 1452      Knee/Hip Exercises: Aerobic   Nustep  L4 x 8 min for warm up      Manual Therapy   Manual Therapy  Soft tissue mobilization;Myofascial release;Joint mobilization    Manual therapy comments  pt prone (multiple pillows needed to accommodate back; skilled palpation of soft tissue during DN    Joint Mobilization  grades 2-3 LAD and A/P mobs Rt hip    Soft tissue mobilization  Lt glutes and piriformis    Myofascial Release  Lt glutes and piriformis       Trigger Point Dry Needling - 07/20/17 1529    Consent Given?  Yes    Muscles Treated Lower Body  Gluteus maximus glute med (Lt)    Gluteus Maximus Response  Twitch response elicited;Palpable increased muscle length                PT Long Term Goals - 06/29/17 2055      PT LONG TERM GOAL #1   Title  Pt will be independent with HEP in order to indicate reduced pain and improved functional mobility. (Target Date: 07/29/17)    Time  4    Period  Weeks    Status  New    Target Date  07/29/17      PT LONG TERM GOAL #2   Title  Pt will report no more than 4/10 pain with functional mobility in order to indicate reduced pain with ADLs.     Time  4    Period  Weeks    Status  New      PT LONG TERM GOAL #3   Title  Pt will demonstrate at least 4/5 strength in BLEs in order to indicate improved proximal stabilization.     Time  4    Period  Weeks    Status  New      PT LONG TERM GOAL #4   Title  Pt will improve walking tolerance to 60 minutes in order to indicate return to community and leisure activity.  Time  4    Period  Weeks    Status  New      PT LONG TERM GOAL #5   Title  Pt will improve standing tolerance to 25 mins in order to indicate return to ADLs, such as cooking, in the standing position.      Time  4    Period  Weeks    Status  New            Plan - 07/20/17  1530    Clinical Impression Statement  Pt reports decreasing tightness and pain overall, but symptoms do seem to return after ~ 1 week.  Progressing slowly with PT, needs stabilization exercises.    PT Next Visit Plan  assess response to DN, try LAD to Rt hip followed by stabilization, DN/manual/modalities PRN    Consulted and Agree with Plan of Care  Patient       Patient will benefit from skilled therapeutic intervention in order to improve the following deficits and impairments:  Decreased activity tolerance, Hypermobility, Impaired flexibility, Postural dysfunction, Difficulty walking, Decreased strength, Impaired perceived functional ability, Impaired sensation  Visit Diagnosis: Pain in right hip  Pain in left hip  Unspecified disturbances of skin sensation     Problem List Patient Active Problem List   Diagnosis Date Noted  . Anxiety 04/24/2017  . Mid back pain 03/26/2017  . Left sided chest pain 03/25/2017  . Dyspnea on exertion 03/14/2017  . Hyperkalemia 03/13/2017  . Rash and nonspecific skin eruption 01/08/2017  . Submandibular gland swelling 01/08/2017  . High risk medication use 09/02/2016  . Urinary frequency 09/02/2016  . Piriformis muscle pain 09/02/2016  . Fatigue 07/22/2016  . Hair loss 07/22/2016  . Right hip pain 04/08/2016  . Right knee pain 02/25/2016  . Dyspepsia 04/05/2015  . Numbness 11/14/2014  . Biceps tendon tear 10/10/2014  . Insomnia 10/10/2014  . Gait disturbance 06/13/2014  . 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 OP (osteoporosis) 01/18/2014  . Other long term (current) drug therapy 01/18/2014  . Other dysphagia 12/29/2013  . Hoarseness 12/29/2013  . 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 Personal history of colonic polyps 07/03/2010  . Nocturia 05/28/2010  . THYROID NODULE 05/17/2008  . Multiple sclerosis ([place]) 05/17/2008  . Chronic fatigue 03/30/2007  . Hyperlipidemia 08/20/2006  . Essential hypertension 08/20/2006   . Rheumatoid arthritis ([place]) 08/20/2006  . CERVICAL CANCER, HX OF 08/20/2006  . GASTRIC ULCER, HX OF 08/20/2006      Laureen Abrahams, PT, DPT 07/20/17 3:31 PM    [facility] [address] [place], [place], [address] Phone: [phone]   Fax:  [phone]  Name: Sherri [name] MRN: [id number] Date of Birth: 07/01/1940

## 2017-07-22 ENCOUNTER — Encounter: Payer: Self-pay | Admitting: Physical Therapy

## 2017-07-22 ENCOUNTER — Ambulatory Visit: Payer: Medicare Other | Admitting: Physical Therapy

## 2017-07-22 DIAGNOSIS — M25551 Pain in right hip: Secondary | ICD-10-CM | POA: Diagnosis not present

## 2017-07-22 DIAGNOSIS — R209 Unspecified disturbances of skin sensation: Secondary | ICD-10-CM

## 2017-07-22 DIAGNOSIS — M25552 Pain in left hip: Secondary | ICD-10-CM | POA: Diagnosis not present

## 2017-07-22 NOTE — Therapy (Signed)
[facility] [address] [place], [place], [address] Phone: [phone]   Fax:  [phone]  Physical Therapy Treatment  Patient Details  Name: Sherri [name] MRN: [id number] Date of Birth: 07/28/1940 Referring Provider: Arlice Colt, MD   Encounter Date: 07/22/2017  PT End of Session - 07/22/17 1524    Visit Number  6    Number of Visits  9    Date for PT Re-Evaluation  08/28/17    Authorization Type  [employer]HC Medicare    PT Start Time  1444    PT Stop Time  1524    PT Time Calculation (min)  40 min    Activity Tolerance  Patient tolerated treatment well    Behavior During Therapy  [facility] for tasks assessed/performed       Past Medical History:  Diagnosis Date  . Anxiety    takes Valium daily as needed  . Bruises easily    d/t meds  . Cancer ([place])    basal cell ca, in situ- uterine   . Cataracts, bilateral   . Chronic back pain   . Dizziness    r/t to meds  . GERD (gastroesophageal reflux disease)    no meds on a regular basis but will take Tums if needed  . Headache(784.0)    r/t neck issues  . History of bronchitis 6-5yrs ago  . History of colon polyps    benign  . Hyperlipidemia    takes Atorvastatin on Mondays and Fridays  . Hypertension    takes Ramipril daily  . Joint pain   . Joint swelling   . Multiple sclerosis ([place])   . Neuromuscular disorder ([place])    Dr. Park Liter- [facility], follows M.S.  . Nocturia   . OA (osteoarthritis)    Dr Jani Gravel weekly, RA- hands- knees- feet   . PONV (postoperative nausea and vomiting)    trouble urinating after surgery in 2014  . Rheumatoid arthritis(714.0)    Dr Estanislado Pandy takes [name] daily  . Skin cancer   . Urinary retention    sees Dr.Wrenn about 2 times a yr    Past Surgical History:  Procedure Laterality Date  . ABDOMINAL HYSTERECTOMY     1985  . APPENDECTOMY     with TAH  . BACK SURGERY     several  . BROW LIFT Bilateral  10/02/2016   Procedure: BILATERAL LOWER LID BLEPHAROPLASTY;  Surgeon: Clista Bernhardt, MD;  Location: [facility];  Service: Plastics;  Laterality: Bilateral;  . CARPAL TUNNEL RELEASE Right   . cataracts    . CERVICAL FUSION     Dr Joya Salm  . CERVICAL FUSION  12/31/2012   Dr Joya Salm  . CHEST TUBE INSERTION     for traumatic Pneumothorax  . COLONOSCOPY  07/16/2010   normal   . COLONOSCOPY W/ POLYPECTOMY  1997   negative since; Dr Deatra Ina  . ESOPHAGOGASTRODUODENOSCOPY  07/16/2010   normal  . eye lid raise    . EYE SURGERY Bilateral    cataracts removed - /w IOL  . LUMBAR FUSION     Dr Joya Salm  . NASAL SINUS SURGERY    . POSTERIOR CERVICAL FUSION/FORAMINOTOMY N/A 12/31/2012   Procedure: POSTERIOR LATERAL CERVICAL FUSION/FORAMINOTOMY LEVEL 1 CERVICAL THREE-FOUR WITH LATERAL MASS SCREWS;  Surgeon: Floyce Stakes, MD;  Location: [facility];  Service: Neurosurgery;  Laterality: N/A;  . PTOSIS REPAIR Bilateral 10/02/2016   Procedure: INTERNAL PTOSIS REPAIR;  Surgeon: Georjean Mode  Rosezella Florida, MD;  Location: [facility];  Service: Plastics;  Laterality: Bilateral;  . SKIN BIOPSY    . THYROID SURGERY     R lobe removed- 1972, has grown back - CT last done- 2018  . TOTAL HIP ARTHROPLASTY  12/22/2011   Procedure: TOTAL HIP ARTHROPLASTY;  Surgeon: Kerin Salen, MD;  Location: [facility];  Service: Orthopedics;  Laterality: Left;  . TUBAL LIGATION      There were no vitals filed for this visit.  Subjective Assessment - 07/22/17 1449    Subjective  "I'm sick and tired of my butt hurting, but I think it's a little better."  Rt hip hurts worse today than Lt.    Patient Stated Goals  "I want to get back to playing golf and dancing."     Currently in Pain?  Yes    Pain Score  5     Pain Location  Sacrum    Pain Orientation  Left;Right    Pain Descriptors / Indicators  Dull;Aching    Pain Type  Chronic pain    Pain Onset  More than a month ago    Pain Frequency  Constant    Aggravating Factors   standing and  sleeping on hip    Pain Relieving Factors  deep tissue work, DN                      [facility] Adult PT Treatment/Exercise - 07/22/17 1452      Exercises   Exercises  Lumbar      Lumbar Exercises: Quadruped   Straight Leg Raise  10 reps;3 seconds over peanut ball      Knee/Hip Exercises: Aerobic   Nustep  L3 x 8 min      Knee/Hip Exercises: Supine   Bridges  Both;10 reps with isometric hip abdct    Other Supine Knee/Hip Exercises  isometric clams hooklying 10x5 sec hold    Other Supine Knee/Hip Exercises  isometric hip ext into green physioball alt x10 reps bil; trunk rotation with green physioball x10 bil      Manual Therapy   Manual Therapy  Joint mobilization    Manual therapy comments  Rt iliac crest and ASIS elevated; equal with Lt after manual    Joint Mobilization  LAD with strap grades 2-3                  PT Long Term Goals - 06/29/17 2055      PT LONG TERM GOAL #1   Title  Pt will be independent with HEP in order to indicate reduced pain and improved functional mobility. (Target Date: 07/29/17)    Time  4    Period  Weeks    Status  New    Target Date  07/29/17      PT LONG TERM GOAL #2   Title  Pt will report no more than 4/10 pain with functional mobility in order to indicate reduced pain with ADLs.     Time  4    Period  Weeks    Status  New      PT LONG TERM GOAL #3   Title  Pt will demonstrate at least 4/5 strength in BLEs in order to indicate improved proximal stabilization.     Time  4    Period  Weeks    Status  New      PT LONG TERM GOAL #4   Title  Pt will improve  walking tolerance to 60 minutes in order to indicate return to community and leisure activity.     Time  4    Period  Weeks    Status  New      PT LONG TERM GOAL #5   Title  Pt will improve standing tolerance to 25 mins in order to indicate return to ADLs, such as cooking, in the standing position.      Time  4    Period  Weeks    Status  New             Plan - 07/22/17 1525    Clinical Impression Statement  Pt tolerated stabilization exercises well today; and pt planning for d/c next week to HEP.    PT Treatment/Interventions  ADLs/Self Care Home Management;Gait training;Stair training;Functional mobility training;Therapeutic activities;Therapeutic exercise;Neuromuscular re-education;Patient/family education;Manual techniques;Passive range of motion;Dry needling    PT Next Visit Plan  assess response to DN, try LAD to Rt hip followed by stabilization, DN/manual/modalities PRN    Consulted and Agree with Plan of Care  Patient       Patient will benefit from skilled therapeutic intervention in order to improve the following deficits and impairments:  Decreased activity tolerance, Hypermobility, Impaired flexibility, Postural dysfunction, Difficulty walking, Decreased strength, Impaired perceived functional ability, Impaired sensation  Visit Diagnosis: Pain in right hip  Pain in left hip  Unspecified disturbances of skin sensation     Problem List Patient Active Problem List   Diagnosis Date Noted  . Anxiety 04/24/2017  . Mid back pain 03/26/2017  . Left sided chest pain 03/25/2017  . Dyspnea on exertion 03/14/2017  . Hyperkalemia 03/13/2017  . Rash and nonspecific skin eruption 01/08/2017  . Submandibular gland swelling 01/08/2017  . High risk medication use 09/02/2016  . Urinary frequency 09/02/2016  . Piriformis muscle pain 09/02/2016  . Fatigue 07/22/2016  . Hair loss 07/22/2016  . Right hip pain 04/08/2016  . Right knee pain 02/25/2016  . Dyspepsia 04/05/2015  . Numbness 11/14/2014  . Biceps tendon tear 10/10/2014  . Insomnia 10/10/2014  . Gait disturbance 06/13/2014  . 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 OP (osteoporosis) 01/18/2014  . Other long term (current) drug therapy 01/18/2014  . Other dysphagia 12/29/2013  . Hoarseness 12/29/2013  . 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  Personal history of colonic polyps 07/03/2010  . Nocturia 05/28/2010  . THYROID NODULE 05/17/2008  . Multiple sclerosis ([place]) 05/17/2008  . Chronic fatigue 03/30/2007  . Hyperlipidemia 08/20/2006  . Essential hypertension 08/20/2006  . Rheumatoid arthritis ([place]) 08/20/2006  . CERVICAL CANCER, HX OF 08/20/2006  . GASTRIC ULCER, HX OF 08/20/2006      Laureen Abrahams, PT, DPT 07/22/17 3:27 PM    [facility] [address] [place], [place], [address] Phone: [phone]   Fax:  [phone]  Name: HASEL JANISH MRN: [id number] Date of Birth: 05-07-1941

## 2017-07-27 ENCOUNTER — Ambulatory Visit: Payer: Medicare Other | Attending: Neurology | Admitting: Rehabilitation

## 2017-07-27 ENCOUNTER — Encounter: Payer: Self-pay | Admitting: Rehabilitation

## 2017-07-27 DIAGNOSIS — M25551 Pain in right hip: Secondary | ICD-10-CM | POA: Diagnosis not present

## 2017-07-27 DIAGNOSIS — M25552 Pain in left hip: Secondary | ICD-10-CM

## 2017-07-27 NOTE — Therapy (Signed)
[facility] [address] [place], [place], [address] Phone: [phone]   Fax:  [phone]  Physical Therapy Treatment  Patient Details  Name: Sherri [name] MRN: [id number] Date of Birth: [birth year] Referring Provider: Arlice Colt, MD   Encounter Date: 07/27/2017  PT End of Session - 07/27/17 1410    Visit Number  7    Number of Visits  9    Date for PT Re-Evaluation  08/28/17    Authorization Type  [employer] Medicare    PT Start Time  1402    PT Stop Time  1445    PT Time Calculation (min)  43 min    Activity Tolerance  Patient tolerated treatment well    Behavior During Therapy  [facility] for tasks assessed/performed       Past Medical History:  Diagnosis Date  . Anxiety    takes Valium daily as needed  . Bruises easily    d/t meds  . Cancer ([place])    basal cell ca, in situ- uterine   . Cataracts, bilateral   . Chronic back pain   . Dizziness    r/t to meds  . GERD (gastroesophageal reflux disease)    no meds on a regular basis but will take Tums if needed  . Headache(784.0)    r/t neck issues  . History of bronchitis 6-3yrs ago  . History of colon polyps    benign  . Hyperlipidemia    takes Atorvastatin on Mondays and Fridays  . Hypertension    takes Ramipril daily  . Joint pain   . Joint swelling   . Multiple sclerosis ([place])   . Neuromuscular disorder ([place])    Dr. Park Liter- [facility], follows M.S.  . Nocturia   . OA (osteoarthritis)    Dr Jani Gravel weekly, RA- hands- knees- feet   . PONV (postoperative nausea and vomiting)    trouble urinating after surgery in 2014  . Rheumatoid arthritis(714.0)    Dr Estanislado Pandy takes [name] daily  . Skin cancer   . Urinary retention    sees Dr.Wrenn about 2 times a yr    Past Surgical History:  Procedure Laterality Date  . ABDOMINAL HYSTERECTOMY     1985  . APPENDECTOMY     with TAH  . BACK SURGERY     several  . BROW LIFT Bilateral  10/02/2016   Procedure: BILATERAL LOWER LID BLEPHAROPLASTY;  Surgeon: Clista Bernhardt, MD;  Location: [facility];  Service: Plastics;  Laterality: Bilateral;  . CARPAL TUNNEL RELEASE Right   . cataracts    . CERVICAL FUSION     Dr Joya Salm  . CERVICAL FUSION  12/31/2012   Dr Joya Salm  . CHEST TUBE INSERTION     for traumatic Pneumothorax  . COLONOSCOPY  07/16/2010   normal   . COLONOSCOPY W/ POLYPECTOMY  1997   negative since; Dr Deatra Ina  . ESOPHAGOGASTRODUODENOSCOPY  07/16/2010   normal  . eye lid raise    . EYE SURGERY Bilateral    cataracts removed - /w IOL  . LUMBAR FUSION     Dr Joya Salm  . NASAL SINUS SURGERY    . POSTERIOR CERVICAL FUSION/FORAMINOTOMY N/A 12/31/2012   Procedure: POSTERIOR LATERAL CERVICAL FUSION/FORAMINOTOMY LEVEL 1 CERVICAL THREE-FOUR WITH LATERAL MASS SCREWS;  Surgeon: Floyce Stakes, MD;  Location: [facility];  Service: Neurosurgery;  Laterality: N/A;  . PTOSIS REPAIR Bilateral 10/02/2016   Procedure: INTERNAL PTOSIS REPAIR;  Surgeon: Georjean Mode  Rosezella Florida, MD;  Location: [facility];  Service: Plastics;  Laterality: Bilateral;  . SKIN BIOPSY    . THYROID SURGERY     R lobe removed- 1972, has grown back - CT last done- 2018  . TOTAL HIP ARTHROPLASTY  12/22/2011   Procedure: TOTAL HIP ARTHROPLASTY;  Surgeon: Kerin Salen, MD;  Location: [facility];  Service: Orthopedics;  Laterality: Left;  . TUBAL LIGATION      There were no vitals filed for this visit.  Subjective Assessment - 07/27/17 1408    Subjective  I'm feeling a little better.  I want those needles for my last visit."    Pertinent History  Hx of C3/4 fusion and C4-7 fushion, L3-S1 fused per patient, hx of RA    How long can you sit comfortably?  sitting is better than standing     How long can you stand comfortably?  10 mins    How long can you walk comfortably?  30-45 mins    Patient Stated Goals  "I want to get back to playing golf and dancing."     Currently in Pain?  Yes    Pain Score  4     Pain Location   Hip    Pain Orientation  Right;Left R>L, esp when walking    Pain Descriptors / Indicators  Aching;Dull    Pain Type  Chronic pain    Pain Onset  More than a month ago    Pain Frequency  Constant    Aggravating Factors   standing and sleeping on hip    Pain Relieving Factors  deep tissue work, DN                      [facility] Adult PT Treatment/Exercise - 07/27/17 1418      Self-Care   Self-Care  Other Self-Care Comments    Other Self-Care Comments   Provided education on having someone assist her with IT band release at home with foam roller or tennis ball.  She reports that she has done with frozen ice bottle, PT states that this is also ok.        Lumbar Exercises: Supine   Bridge  10 reps with seat belt performing isometric abd    Other Supine Lumbar Exercises  BLE extension into red physioball lifting hips x 10 reps    Other Supine Lumbar Exercises  Supine straight leg isometric extension while performing SLR on opposite leg x 5 reps on each side.        Lumbar Exercises: Sidelying   Clam  5 reps;5 seconds isometric with resistance from PT      Knee/Hip Exercises: Stretches   Active Hamstring Stretch  Both;1 rep 2 mins each      Manual Therapy   Manual Therapy  Joint mobilization;Muscle Energy Technique    Manual therapy comments  Rt iliac crest and ASIS elevated; equal with Lt after manual    Joint Mobilization  LAD with strap grades 2-3    Myofascial Release  R and L IT band release with foam roller     Muscle Energy Technique  For pelvic stability performed isometric hip ext with concurrent isometric hip flex (with use of wooden dowel) x 5 reps with 5 sec holds.              PT Education - 07/27/17 1409    Education provided  Yes    Education Details  IT band release  Person(s) Educated  Patient    Methods  Explanation    Comprehension  Verbalized understanding          PT Long Term Goals - 06/29/17 2055      PT LONG TERM GOAL #1   Title   Pt will be independent with HEP in order to indicate reduced pain and improved functional mobility. (Target Date: 07/29/17)    Time  4    Period  Weeks    Status  New    Target Date  07/29/17      PT LONG TERM GOAL #2   Title  Pt will report no more than 4/10 pain with functional mobility in order to indicate reduced pain with ADLs.     Time  4    Period  Weeks    Status  New      PT LONG TERM GOAL #3   Title  Pt will demonstrate at least 4/5 strength in BLEs in order to indicate improved proximal stabilization.     Time  4    Period  Weeks    Status  New      PT LONG TERM GOAL #4   Title  Pt will improve walking tolerance to 60 minutes in order to indicate return to community and leisure activity.     Time  4    Period  Weeks    Status  New      PT LONG TERM GOAL #5   Title  Pt will improve standing tolerance to 25 mins in order to indicate return to ADLs, such as cooking, in the standing position.      Time  4    Period  Weeks    Status  New            Plan - 07/27/17 1510    Clinical Impression Statement  Skilled session continues to focus on stabilization exercises.  Pt reporting that she feels better, pain is better and she is sleeping better.  Pt aware of D/C on Wednesday.     PT Treatment/Interventions  ADLs/Self Care Home Management;Gait training;Stair training;Functional mobility training;Therapeutic activities;Therapeutic exercise;Neuromuscular re-education;Patient/family education;Manual techniques;Passive range of motion;Dry needling    PT Next Visit Plan  DN and D/C    Consulted and Agree with Plan of Care  Patient       Patient will benefit from skilled therapeutic intervention in order to improve the following deficits and impairments:  Decreased activity tolerance, Hypermobility, Impaired flexibility, Postural dysfunction, Difficulty walking, Decreased strength, Impaired perceived functional ability, Impaired sensation  Visit Diagnosis: Pain in right  hip  Pain in left hip     Problem List Patient Active Problem List   Diagnosis Date Noted  . Anxiety 04/24/2017  . Mid back pain 03/26/2017  . Left sided chest pain 03/25/2017  . Dyspnea on exertion 03/14/2017  . Hyperkalemia 03/13/2017  . Rash and nonspecific skin eruption 01/08/2017  . Submandibular gland swelling 01/08/2017  . High risk medication use 09/02/2016  . Urinary frequency 09/02/2016  . Piriformis muscle pain 09/02/2016  . Fatigue 07/22/2016  . Hair loss 07/22/2016  . Right hip pain 04/08/2016  . Right knee pain 02/25/2016  . Dyspepsia 04/05/2015  . Numbness 11/14/2014  . Biceps tendon tear 10/10/2014  . Insomnia 10/10/2014  . Gait disturbance 06/13/2014  . 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 OP (osteoporosis) 01/18/2014  . Other long term (current) drug therapy 01/18/2014  . Other dysphagia 12/29/2013  . Hoarseness 12/29/2013  .  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 Personal history of colonic polyps 07/03/2010  . Nocturia 05/28/2010  . THYROID NODULE 05/17/2008  . Multiple sclerosis ([place]) 05/17/2008  . Chronic fatigue 03/30/2007  . Hyperlipidemia 08/20/2006  . Essential hypertension 08/20/2006  . Rheumatoid arthritis ([place]) 08/20/2006  . CERVICAL CANCER, HX OF 08/20/2006  . GASTRIC ULCER, HX OF 08/20/2006   Cameron Sprang, PT, MPT [facility] [address] [place], [place], [address] Phone: [phone]   Fax:  [phone] 07/27/17, 3:11 PM  Name: Sherri [name] MRN: [id number] Date of Birth: 1941/03/22

## 2017-07-29 ENCOUNTER — Encounter: Payer: Self-pay | Admitting: Physical Therapy

## 2017-07-29 ENCOUNTER — Ambulatory Visit: Payer: Medicare Other | Admitting: Physical Therapy

## 2017-07-29 DIAGNOSIS — M25552 Pain in left hip: Secondary | ICD-10-CM

## 2017-07-29 DIAGNOSIS — M25551 Pain in right hip: Secondary | ICD-10-CM | POA: Diagnosis not present

## 2017-07-29 NOTE — Therapy (Signed)
[facility] [address] [place], [place], [address] Phone: [phone]   Fax:  [phone]  Physical Therapy Treatment/Discharge  Patient Details  Name: FLORINA GLAS MRN: [id number] Date of Birth: 09-27-1940 Referring Provider: Arlice Colt, MD   Encounter Date: 07/29/2017  PT End of Session - 07/29/17 1436    Visit Number  8    Authorization Type  UHC Medicare    PT Start Time  0630    PT Stop Time  1432    PT Time Calculation (min)  41 min    Activity Tolerance  Patient tolerated treatment well    Behavior During Therapy  [facility] for tasks assessed/performed       Past Medical History:  Diagnosis Date  . Anxiety    takes Valium daily as needed  . Bruises easily    d/t meds  . Cancer ([place])    basal cell ca, in situ- uterine   . Cataracts, bilateral   . Chronic back pain   . Dizziness    r/t to meds  . GERD (gastroesophageal reflux disease)    no meds on a regular basis but will take Tums if needed  . Headache(784.0)    r/t neck issues  . History of bronchitis 6-77yr ago  . History of colon polyps    benign  . Hyperlipidemia    takes Atorvastatin on Mondays and Fridays  . Hypertension    takes Ramipril daily  . Joint pain   . Joint swelling   . [place]   . Neuromuscular disorder ([place]    Dr. RPark Liter [facility], follows M.S.  . Nocturia   . OA (osteoarthritis)    Dr VJani Gravelweekly, RA- hands- knees- feet   . PONV (postoperative nausea and vomiting)    trouble urinating after surgery in 2014  . Rheumatoid arthritis(714.0)    Dr DEstanislado Pandytakes XMorrie Sheldondaily  . Skin cancer   . Urinary retention    sees Dr.Wrenn about 2 times a yr    Past Surgical History:  Procedure Laterality Date  . ABDOMINAL HYSTERECTOMY     1985  . APPENDECTOMY     with TAH  . BACK SURGERY     several  . BROW LIFT Bilateral 10/02/2016   Procedure: BILATERAL LOWER LID  BLEPHAROPLASTY;  Surgeon: AClista Bernhardt MD;  [place] [facility]  Service: Plastics;  Laterality: Bilateral;  . CARPAL TUNNEL RELEASE Right   . cataracts    . CERVICAL FUSION     Dr BJoya Salm . CERVICAL FUSION  12/31/2012   Dr BJoya Salm . CHEST TUBE INSERTION     for traumatic Pneumothorax  . COLONOSCOPY  07/16/2010   normal   . COLONOSCOPY W/ POLYPECTOMY  1997   negative since; Dr KDeatra Ina . ESOPHAGOGASTRODUODENOSCOPY  07/16/2010   normal  . eye lid raise    . EYE SURGERY Bilateral    cataracts removed - /w IOL  . LUMBAR FUSION     Dr BJoya Salm . NASAL SINUS SURGERY    . POSTERIOR CERVICAL FUSION/FORAMINOTOMY N/A 12/31/2012   Procedure: POSTERIOR LATERAL CERVICAL FUSION/FORAMINOTOMY LEVEL 1 CERVICAL THREE-FOUR WITH LATERAL MASS SCREWS;  Surgeon: EFloyce Stakes MD;  [facility];  Service: Neurosurgery;  Laterality: N/A;  . PTOSIS REPAIR Bilateral 10/02/2016   Procedure: INTERNAL PTOSIS REPAIR;  Surgeon: AClista Bernhardt MD;  [facility] [facility]  Service: Plastics;  Laterality: Bilateral;  . SKIN BIOPSY    .  THYROID SURGERY     R lobe removed- 1972, has grown back - CT last done- 2018  . TOTAL HIP ARTHROPLASTY  12/22/2011   Procedure: TOTAL HIP ARTHROPLASTY;  Surgeon: Kerin Salen, MD;  Location: [facility];  Service: Orthopedics;  Laterality: Left;  . TUBAL LIGATION      There were no vitals filed for this visit.  Subjective Assessment - 07/29/17 1355    Subjective  no pain today; had some yesterday but it's resolved now.  still has some trigger points. compliant with exercises.  rates pain up to 3/10 with activity.    How long can you stand comfortably?  30 min    How long can you walk comfortably?  unsure; but feels she could go > 1 hour    Patient Stated Goals  "I want to get back to playing golf and dancing."     Currently in Pain?  No/denies         [facility] PT Assessment - 07/29/17 1400      ROM / Strength   AROM / PROM / Strength  Strength      Strength    Strength Assessment Site  Hip    Right/Left Hip  Right;Left    Right Hip Flexion  4/5    Right Hip Extension  3/5    Right Hip External Rotation   4/5    Right Hip Internal Rotation  4/5    Right Hip ABduction  3+/5    Right Hip ADduction  4/5    Left Hip Flexion  4/5    Left Hip Extension  3/5    Left Hip External Rotation  3/5    Left Hip Internal Rotation  4/5    Left Hip ABduction  3+/5    Left Hip ADduction  4/5                  [facility] Adult PT Treatment/Exercise - 07/29/17 1355      Lumbar Exercises: Aerobic   Nustep  L3 x 5 min      Manual Therapy   Manual therapy comments  skilled palpation of soft tissue during DN    Soft tissue mobilization  bil glutes    Myofascial Release  bil glutes       Trigger Point Dry Needling - 07/29/17 1428    Consent Given?  Yes    Muscles Treated Lower Body  Gluteus maximus glute med; bil    Gluteus Maximus Response  Twitch response elicited;Palpable increased muscle length                PT Long Term Goals - 07/29/17 1436      PT LONG TERM GOAL #1   Title  Pt will be independent with HEP in order to indicate reduced pain and improved functional mobility. (Target Date: 07/29/17)    Status  Achieved      PT LONG TERM GOAL #2   Title  Pt will report no more than 4/10 pain with functional mobility in order to indicate reduced pain with ADLs.     Status  Achieved      PT LONG TERM GOAL #3   Title  Pt will demonstrate at least 4/5 strength in BLEs in order to indicate improved proximal stabilization.     Status  Partially Met      PT LONG TERM GOAL #4   Title  Pt will improve walking tolerance to 60 minutes in order to indicate  return to community and leisure activity.     Status  Achieved      PT LONG TERM GOAL #5   Title  Pt will improve standing tolerance to 25 mins in order to indicate return to ADLs, such as cooking, in the standing position.      Status  Achieved            Plan - 07/29/17 1436     Clinical Impression Statement  Pt has met all goals except only partially met strength goal, but has HEP to address strengthening and stabilization.  Ready for d/c today.    PT Treatment/Interventions  ADLs/Self Care Home Management;Gait training;Stair training;Functional mobility training;Therapeutic activities;Therapeutic exercise;Neuromuscular re-education;Patient/family education;Manual techniques;Passive range of motion;Dry needling    PT Next Visit Plan  d/c PT today    Consulted and Agree with Plan of Care  Patient       Patient will benefit from skilled therapeutic intervention in order to improve the following deficits and impairments:  Decreased activity tolerance, Hypermobility, Impaired flexibility, Postural dysfunction, Difficulty walking, Decreased strength, Impaired perceived functional ability, Impaired sensation  Visit Diagnosis: Pain in right hip  Pain in left hip     Problem List Patient Active Problem List   Diagnosis Date Noted  . Anxiety 04/24/2017  . Mid back pain 03/26/2017  . Left sided chest pain 03/25/2017  . Dyspnea on exertion 03/14/2017  . Hyperkalemia 03/13/2017  . Rash and nonspecific skin eruption 01/08/2017  . Submandibular gland swelling 01/08/2017  . High risk medication use 09/02/2016  . Urinary frequency 09/02/2016  . Piriformis muscle pain 09/02/2016  . Fatigue 07/22/2016  . Hair loss 07/22/2016  . Right hip pain 04/08/2016  . Right knee pain 02/25/2016  . Dyspepsia 04/05/2015  . Numbness 11/14/2014  . Biceps tendon tear 10/10/2014  . Insomnia 10/10/2014  . Gait disturbance 06/13/2014  . 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 OP (osteoporosis) 01/18/2014  . Other long term (current) drug therapy 01/18/2014  . Other dysphagia 12/29/2013  . Hoarseness 12/29/2013  . 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 Personal history of colonic polyps 07/03/2010  . Nocturia 05/28/2010  . THYROID NODULE 05/17/2008  . Multiple  sclerosis ([place]) 05/17/2008  . Chronic fatigue 03/30/2007  . Hyperlipidemia 08/20/2006  . Essential hypertension 08/20/2006  . Rheumatoid arthritis ([place]) 08/20/2006  . CERVICAL CANCER, HX OF 08/20/2006  . GASTRIC ULCER, HX OF 08/20/2006      Laureen Abrahams, PT, DPT 07/29/17 2:38 PM    [facility] [address] [place], [place], [address] Phone: [phone]   Fax:  [phone]  Name: YVANNA VIDAS MRN: [id number] Date of Birth: March 28, 1941       PHYSICAL THERAPY DISCHARGE SUMMARY  Visits from Start of Care: 8  Current functional level related to goals / functional outcomes: See above   Remaining deficits: See above   Education / Equipment: HEP, manual techniques Plan: Patient agrees to discharge.  Patient goals were met. Patient is being discharged due to meeting the stated rehab goals.  ?????      Laureen Abrahams, PT, DPT 07/29/17 2:38 PM  [facility] [address] [place], [place] [address]  [phone] (office) [phone] (fax)

## 2017-07-30 ENCOUNTER — Ambulatory Visit: Payer: Medicare Other | Admitting: Rehabilitation

## 2017-07-30 ENCOUNTER — Telehealth: Payer: Self-pay | Admitting: Rheumatology

## 2017-07-30 NOTE — Telephone Encounter (Signed)
Patient called stating that she needs some Xeljanz samples.  Patient states that she is out of her medicine.

## 2017-07-30 NOTE — Telephone Encounter (Signed)
Patient advised she may come to office to pick up samples.

## 2017-08-01 ENCOUNTER — Other Ambulatory Visit: Payer: Self-pay | Admitting: Internal Medicine

## 2017-08-05 NOTE — Progress Notes (Signed)
Subjective:    Patient ID: Sherri [name], female    DOB: [birth year], 77 y.o.   MRN: [id number]  HPI The patient is here for an acute visit.   She has a family history of stomach cancer.  She burps all the time and she is hoarse all the time.  She denies heartburn or dry ocugh.  She has occasional burning sensation in her upper throat and take tums.    She does have difficulty swallowing at times.  She is concerned about her family history and has been several years since her last endoscopy.  She currently is not taking any daily medication for her stomach.  She denies any abdominal pain.  Hypertension: She is taking all of her medication daily, but most days does not remember to take the second carvedilol.  She wonders if there is a different option so that she will not forget to take the second dose.  Bruising, skin laceration: She bruises very easily.  Her legs are covered with bruises.  She has recurring bruises on her arms with minimal trauma.  The other night she rolled over in bed and toward her skin on her right lower arm.  This is frustrating to her.  She thinks this has gotten worse.  Chronic arthritis pain.  She is taking the tramadol twice daily.  She also takes oxycodone at night.  She wondered if she should take the tramadol more.  At times her pain is severe, but she does not want to take more of the medication than needed.  She is concerned about possibly becoming addicted.  Medications and allergies reviewed with patient and updated if appropriate.  Patient Active Problem List   Diagnosis Date Noted  . Anxiety 04/24/2017  . Mid back pain 03/26/2017  . Left sided chest pain 03/25/2017  . Dyspnea on exertion 03/14/2017  . Hyperkalemia 03/13/2017  . Rash and nonspecific skin eruption 01/08/2017  . Submandibular gland swelling 01/08/2017  . High risk medication use 09/02/2016  . Urinary frequency 09/02/2016  . Piriformis muscle pain 09/02/2016  . Fatigue 07/22/2016    . Hair loss 07/22/2016  . Right hip pain 04/08/2016  . Right knee pain 02/25/2016  . Dyspepsia 04/05/2015  . Numbness 11/14/2014  . Biceps tendon tear 10/10/2014  . Insomnia 10/10/2014  . Gait disturbance 06/13/2014  . 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 OP (osteoporosis) 01/18/2014  . Other long term (current) drug therapy 01/18/2014  . Other dysphagia 12/29/2013  . Hoarseness 12/29/2013  . 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 Personal history of colonic polyps 07/03/2010  . Nocturia 05/28/2010  . THYROID NODULE 05/17/2008  . Multiple sclerosis ([place]) 05/17/2008  . Chronic fatigue 03/30/2007  . Hyperlipidemia 08/20/2006  . Essential hypertension 08/20/2006  . Rheumatoid arthritis ([place]) 08/20/2006  . CERVICAL CANCER, HX OF 08/20/2006  . GASTRIC ULCER, HX OF 08/20/2006    Current Outpatient Medications on File Prior to Visit  Medication Sig Dispense Refill  . atorvastatin (LIPITOR) 10 MG tablet TAKE 1 TABLET BY MOUTH ON MONDAYS AND FRIDAY 27 tablet 2  . Biotin 5000 MCG TABS Take 5,000 mcg by mouth daily.     . Calcium Carbonate-Vitamin D (CALCIUM + D PO) Take 1 tablet by mouth daily.     . Carboxymethylcellul-Glycerin (LUBRICATING EYE DROPS OP) Apply 1 drop to eye daily as needed (dry eyes).    . Cholecalciferol (VITAMIN D3) 2000 units capsule Take 2,000 Units by mouth daily.    [name]  Cyanocobalamin (VITAMIN B-12 PO) Take 1 tablet by mouth daily.    . diazepam (VALIUM) 5 MG tablet Take 5 mg by mouth as needed for anxiety.     . diclofenac sodium (VOLTAREN) 1 % GEL APPLY 3 GRAMS TO 3 LARGE JOINTS 3 TIMES A DAY AS NEEDED 295 g 3  . folic acid (FOLVITE) 1 MG tablet TAKE 1 TABLET BY MOUTH EVERY DAY 90 tablet 4  . furosemide (LASIX) 20 MG tablet Take 1 tablet (20 mg total) by mouth daily. -- Office visit needed for further refills 90 tablet 0  . Oxycodone HCl 10 MG TABS Take 1 tablet (10 mg total) by mouth 3 (three) times daily as needed. 90 tablet 0  . Pyridoxine  HCl (VITAMIN B-6 PO) Take 1 tablet by mouth daily.    . Tofacitinib Citrate (XELJANZ XR) 11 MG TB24 Take by mouth daily.    . traMADol (ULTRAM) 50 MG tablet Take 50 mg every 6 (six) hours as needed by mouth for severe pain.    . methotrexate (RHEUMATREX) 2.5 MG tablet TAKE 4 TABLETS (10 MG TOTAL) BY MOUTH ONCE A WEEK. CAUTION:CHEMOTHERAPY. PROTECT FROM LIGHT. 48 tablet 0  . predniSONE (DELTASONE) 1 MG tablet Take 4 tablets (4 mg total) by mouth daily with breakfast. 360 tablet 1  . Tofacitinib Citrate (XELJANZ XR) 11 MG TB24 Take 11 mg by mouth daily. 60 tablet 0   No current facility-administered medications on file prior to visit.     Past Medical History:  Diagnosis Date  . Anxiety    takes Valium daily as needed  . Bruises easily    d/t meds  . Cancer ([place])    basal cell ca, in situ- uterine   . Cataracts, bilateral   . Chronic back pain   . Dizziness    r/t to meds  . GERD (gastroesophageal reflux disease)    no meds on a regular basis but will take Tums if needed  . Headache(784.0)    r/t neck issues  . History of bronchitis 6-55yrs ago  . History of colon polyps    benign  . Hyperlipidemia    takes Atorvastatin on Mondays and Fridays  . Hypertension    takes Ramipril daily  . Joint pain   . Joint swelling   . Multiple sclerosis ([place])   . Neuromuscular disorder ([place])    Dr. Park Liter- [facility], follows [facility]  . Nocturia   . OA (osteoarthritis)    Dr Jani Gravel weekly, RA- hands- knees- feet   . PONV (postoperative nausea and vomiting)    trouble urinating after surgery in 2014  . Rheumatoid arthritis(714.0)    Dr Estanislado Pandy takes [name] daily  . Skin cancer   . Urinary retention    sees Dr.Wrenn about 2 times a yr    Past Surgical History:  Procedure Laterality Date  . ABDOMINAL HYSTERECTOMY     1985  . APPENDECTOMY     with TAH  . BACK SURGERY     several  . BROW LIFT Bilateral 10/02/2016   Procedure: BILATERAL LOWER LID BLEPHAROPLASTY;   Surgeon: Clista Bernhardt, MD;  Location: [facility];  Service: Plastics;  Laterality: Bilateral;  . CARPAL TUNNEL RELEASE Right   . cataracts    . CERVICAL FUSION     Dr Joya Salm  . CERVICAL FUSION  12/31/2012   Dr Joya Salm  . CHEST TUBE INSERTION     for traumatic Pneumothorax  . COLONOSCOPY  07/16/2010   normal   .  COLONOSCOPY W/ POLYPECTOMY  1997   negative since; Dr Deatra Ina  . ESOPHAGOGASTRODUODENOSCOPY  07/16/2010   normal  . eye lid raise    . EYE SURGERY Bilateral    cataracts removed - /w IOL  . LUMBAR FUSION     Dr Joya Salm  . NASAL SINUS SURGERY    . POSTERIOR CERVICAL FUSION/FORAMINOTOMY N/A 12/31/2012   Procedure: POSTERIOR LATERAL CERVICAL FUSION/FORAMINOTOMY LEVEL 1 CERVICAL THREE-FOUR WITH LATERAL MASS SCREWS;  Surgeon: Floyce Stakes, MD;  Location: [facility];  Service: Neurosurgery;  Laterality: N/A;  . PTOSIS REPAIR Bilateral 10/02/2016   Procedure: INTERNAL PTOSIS REPAIR;  Surgeon: Clista Bernhardt, MD;  Location: [facility];  Service: Plastics;  Laterality: Bilateral;  . SKIN BIOPSY    . THYROID SURGERY     R lobe removed- 1972, has grown back - CT last done- 2018  . TOTAL HIP ARTHROPLASTY  12/22/2011   Procedure: TOTAL HIP ARTHROPLASTY;  Surgeon: Kerin Salen, MD;  Location: [facility];  Service: Orthopedics;  Laterality: Left;  . TUBAL LIGATION      Social History   Socioeconomic History  . Marital status: Married    Spouse name: None  . Number of children: None  . Years of education: None  . Highest education level: None  Social Needs  . Financial resource strain: None  . Food insecurity - worry: None  . Food insecurity - inability: None  . Transportation needs - medical: None  . Transportation needs - non-medical: None  Occupational History  . Occupation: retired  Tobacco Use  . Smoking status: Former Smoker    Last attempt to quit: 05/26/1981    Years since quitting: 36.2  . Smokeless tobacco: Never Used  . Tobacco comment: Smoked [phone], up to 2.5 ppd    Substance and Sexual Activity  . Alcohol use: Yes    Alcohol/week: 0.0 oz    Comment: 2% beer  . Drug use: No  . Sexual activity: Yes  Other Topics Concern  . None  Social History Narrative  . None    Family History  Problem Relation Age of Onset  . Cancer Mother        pancreatic  . Diabetes Mother   . Heart disease Father        Rheumatic  . Cancer Father        ? stomach  . Cancer Sister        stomach  . Cancer Maternal Aunt        X 4; ? primary  . Heart disease Paternal Aunt   . Cancer Maternal Grandmother        cervical  . Colon cancer Unknown        Aunts  . Multiple sclerosis Daughter   . Hypertension Neg Hx   . Stroke Neg Hx     Review of Systems  Constitutional: Negative for appetite change and fever.  HENT: Positive for trouble swallowing.   Respiratory: Negative for shortness of breath.   Cardiovascular: Negative for chest pain, palpitations and leg swelling.  Gastrointestinal: Negative for abdominal pain and nausea.       Burping, occ burning in throat  Neurological: Positive for headaches.       Objective:   Vitals:   08/06/17 1112  BP: 138/76  Pulse: 71  Resp: 16  Temp: 97.7 F (36.5 C)  SpO2: 98%   Wt Readings from Last 3 Encounters:  08/06/17 116 lb (52.6 kg)  06/17/17 115 lb (  52.2 kg)  06/09/17 115 lb (52.2 kg)   Body mass index is 20.55 kg/m.   Physical Exam    Constitutional: Appears well-developed and well-nourished. No distress.  HENT:  Head: Normocephalic and atraumatic.  Neck: Neck supple. No tracheal deviation present. No thyromegaly present.  No cervical lymphadenopathy Cardiovascular: Normal rate, regular rhythm and normal heart sounds.   No murmur heard. No carotid bruit .  No edema Pulmonary/Chest: Effort normal and breath sounds normal. No respiratory distress. No has no wheezes. No rales.  Skin: Skin is warm and dry. Not diaphoretic. Several bruises on extremities, small skin tear right forearm with  Band-Aid Psychiatric: Normal mood and affect. Behavior is normal.       Assessment & Plan:    See Problem List for Assessment and Plan of chronic medical problems.

## 2017-08-06 ENCOUNTER — Ambulatory Visit (INDEPENDENT_AMBULATORY_CARE_PROVIDER_SITE_OTHER): Payer: Medicare Other | Admitting: Internal Medicine

## 2017-08-06 ENCOUNTER — Encounter: Payer: Self-pay | Admitting: Internal Medicine

## 2017-08-06 VITALS — BP 138/76 | HR 71 | Temp 97.7°F | Resp 16 | Ht 63.0 in | Wt 116.0 lb

## 2017-08-06 DIAGNOSIS — I10 Essential (primary) hypertension: Secondary | ICD-10-CM

## 2017-08-06 DIAGNOSIS — R238 Other skin changes: Secondary | ICD-10-CM | POA: Diagnosis not present

## 2017-08-06 DIAGNOSIS — K219 Gastro-esophageal reflux disease without esophagitis: Secondary | ICD-10-CM | POA: Diagnosis not present

## 2017-08-06 DIAGNOSIS — R233 Spontaneous ecchymoses: Secondary | ICD-10-CM | POA: Insufficient documentation

## 2017-08-06 MED ORDER — NEBIVOLOL HCL 5 MG PO TABS: 5.0000 mg | ORAL_TABLET | Freq: Every day | ORAL | 5 refills | Status: DC

## 2017-08-06 MED ORDER — RANITIDINE HCL 300 MG PO TABS: 300.0000 mg | ORAL_TABLET | Freq: Every day | ORAL | 5 refills | Status: DC

## 2017-08-06 MED ORDER — GABAPENTIN 100 MG PO CAPS: ORAL_CAPSULE | ORAL | 0 refills | Status: DC

## 2017-08-06 NOTE — Assessment & Plan Note (Signed)
Blood pressure well controlled She often forgets the second dose of the carvedilol and wonders if this can be changed to something different We will try Bystolic 5 mg daily-may need to increase if blood pressure not controlled Ideally advised her to monitor her blood pressure at home to make sure it is controlled

## 2017-08-06 NOTE — Patient Instructions (Signed)
  Medications reviewed and updated.  Changes include starting generic zantac at night for probable heartburn.  Discontinue the coreg and start bystolic once a day for your BP.  Your prescription(s) have been submitted to your pharmacy. Please take as directed and contact our office if you believe you are having problem(s) with the medication(s).  A referral was ordered for GI

## 2017-08-06 NOTE — Assessment & Plan Note (Signed)
Several bruises on extremities-related to mild trauma from being on chronic prednisone Unfortunately this is something she will need to learn to live with, which she understands

## 2017-08-06 NOTE — Assessment & Plan Note (Signed)
Experiencing hoarseness, increased burping, occasional burning sensation in her throat and at times difficulty swallowing Likely GERD She is very concerned about taking a PPI because of her bones especially and other possible side effects Will try Zantac 300 mg at bedtime-hopefully this is strong enough Will refer to GI given her trouble swallowing and family history

## 2017-08-07 ENCOUNTER — Telehealth: Payer: Self-pay | Admitting: Emergency Medicine

## 2017-08-07 MED ORDER — METOPROLOL SUCCINATE ER 25 MG PO TB24: 12.5000 mg | ORAL_TABLET | Freq: Every day | ORAL | 3 refills | Status: DC

## 2017-08-07 MED ORDER — CARVEDILOL 6.25 MG PO TABS: 6.2500 mg | ORAL_TABLET | Freq: Two times a day (BID) | ORAL | 3 refills | Status: DC

## 2017-08-07 NOTE — Telephone Encounter (Signed)
Pt called - she says that if it is too difficult to find a different medication, she will just continue to take what she is currently on.  cb is [phone]

## 2017-08-07 NOTE — Telephone Encounter (Signed)
Alternative sent

## 2017-08-07 NOTE — Telephone Encounter (Signed)
Routing to dr burns again, please disregard request for new rx---patient will keep alternative you have already sent in

## 2017-08-07 NOTE — Telephone Encounter (Signed)
Patient states this Is the medicine that has been on news for causing cancer---she would like something else sent in-----routing back to dr burns, please advise, I will call patient back, thanks

## 2017-08-07 NOTE — Telephone Encounter (Signed)
Bystolic is too expensive, pt would like an alternative sent to [facility]

## 2017-08-14 ENCOUNTER — Telehealth: Payer: Self-pay | Admitting: Internal Medicine

## 2017-08-14 MED ORDER — MAGIC MOUTHWASH: ORAL | 0 refills | Status: DC

## 2017-08-14 NOTE — Telephone Encounter (Signed)
Spoke with pt, state she would like the magic mouth wash.

## 2017-08-14 NOTE — Telephone Encounter (Signed)
Copied from [place]. Topic: Quick Communication - See Telephone Encounter >> Aug 14, 2017  9:33 AM Ether Griffins B wrote: CRM for notification. See Telephone encounter for: 08/14/17. Pt calling requesting mouth wash be called in. She states her mouth feels raw, her face and teeth hurt, and she has a red place on the roof of her her mouth. Please send to [id number] - [place], [place]

## 2017-08-14 NOTE — Telephone Encounter (Signed)
rx faxed to [facility].  

## 2017-08-14 NOTE — Telephone Encounter (Signed)
Please fax rx

## 2017-08-14 NOTE — Telephone Encounter (Signed)
Is she more concerned about thrush which would be nystatin or does she want a magic mouth wash?

## 2017-08-15 ENCOUNTER — Other Ambulatory Visit: Payer: Self-pay | Admitting: Neurology

## 2017-08-17 NOTE — Progress Notes (Signed)
Subjective:    Patient ID: Sherri [name], female    DOB: 11/22/1940, 77 y.o.   MRN: [id number]  HPI She is here for an acute visit for cold symptoms.  She is unsure what is causing her symptoms.  She is unsure if she has a sinus infection.  For the past 10 days she has been experiencing a severe headache, mostly on the left side of her head.  She typically does not get headaches.  The left side of her face is tender more than the right side.  The left side of her face feels warm to her and everything feels swollen, especially the left side.  She keeps biting the inside of her left cheek.  Her entire mouth inside is swollen and raw.  She is a sore on the roof of her mouth.  She has tried Magic mouthwash, but it has not helped.  She most feels like she has the flu.  She has increased body aches.  She is increased fatigue, the left nostril is running and she has sinus pain.  She denies any fevers, chills, change in appetite, nasal congestion, ear pain, sore throat, cough, shortness of breath or wheeze.  Since she had her eye surgery she thinks she has had herpes outbreak.  She has swelling in the left upper lip that has been persistent since her surgery.  She was on Valtrex for several months and it never resolved.  She has seen a dermatologist there was no explanation for her rash/lesions that she had after surgery that were somewhat persistent.  She has had severe sinus infections in the past.  She has had sinus surgery.  She last saw the dentist three months ago and everything was normal.    Her thyroid gland is swollen and she feels like it has gotten larger.  Her last ultrasound was 1 year ago.  At times she does feel some constriction in her throat.  Medications and allergies reviewed with patient and updated if appropriate.  Patient Active Problem List   Diagnosis Date Noted  . Gastroesophageal reflux disease 08/06/2017  . Easy bruising 08/06/2017  . Anxiety 04/24/2017  . Mid  back pain 03/26/2017  . Left sided chest pain 03/25/2017  . Dyspnea on exertion 03/14/2017  . Hyperkalemia 03/13/2017  . Rash and nonspecific skin eruption 01/08/2017  . Submandibular gland swelling 01/08/2017  . High risk medication use 09/02/2016  . Urinary frequency 09/02/2016  . Piriformis muscle pain 09/02/2016  . Fatigue 07/22/2016  . Hair loss 07/22/2016  . Right hip pain 04/08/2016  . Right knee pain 02/25/2016  . Dyspepsia 04/05/2015  . Numbness 11/14/2014  . Biceps tendon tear 10/10/2014  . Insomnia 10/10/2014  . Gait disturbance 06/13/2014  . 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 OP (osteoporosis) 01/18/2014  . Other long term (current) drug therapy 01/18/2014  . Other dysphagia 12/29/2013  . Hoarseness 12/29/2013  . 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 Personal history of colonic polyps 07/03/2010  . Nocturia 05/28/2010  . THYROID NODULE 05/17/2008  . Multiple sclerosis ([place]) 05/17/2008  . Chronic fatigue 03/30/2007  . Hyperlipidemia 08/20/2006  . Essential hypertension 08/20/2006  . Rheumatoid arthritis ([place]) 08/20/2006  . CERVICAL CANCER, HX OF 08/20/2006  . GASTRIC ULCER, HX OF 08/20/2006    Current Outpatient Medications on File Prior to Visit  Medication Sig Dispense Refill  . atorvastatin (LIPITOR) 10 MG tablet TAKE 1 TABLET BY MOUTH ON MONDAYS AND FRIDAY 27 tablet  2  . Biotin 5000 MCG TABS Take 5,000 mcg by mouth daily.     . Calcium Carbonate-Vitamin D (CALCIUM + D PO) Take 1 tablet by mouth daily.     . Carboxymethylcellul-Glycerin (LUBRICATING EYE DROPS OP) Apply 1 drop to eye daily as needed (dry eyes).    . carvedilol (COREG) 6.25 MG tablet Take 1 tablet (6.25 mg total) by mouth 2 (two) times daily with a meal. 60 tablet 3  . Cholecalciferol (VITAMIN D3) 2000 units capsule Take 2,000 Units by mouth daily.    . Cyanocobalamin (VITAMIN B-12 PO) Take 1 tablet by mouth daily.    . diazepam (VALIUM) 5 MG tablet Take 5 mg by mouth as  needed for anxiety.     . diclofenac sodium (VOLTAREN) 1 % GEL APPLY 3 GRAMS TO 3 LARGE JOINTS 3 TIMES A DAY AS NEEDED 338 g 3  . folic acid (FOLVITE) 1 MG tablet TAKE 1 TABLET BY MOUTH EVERY DAY 90 tablet 4  . furosemide (LASIX) 20 MG tablet Take 1 tablet (20 mg total) by mouth daily. -- Office visit needed for further refills 90 tablet 0  . gabapentin (NEURONTIN) 100 MG capsule Take 1 capsule in AM and take 2 capsules in PM 270 capsule 0  . magic mouthwash SOLN 10 ml 4 times a day prn.    Equal parts Maalox, nystatin, diphenhydramine 240 mL 0  . Oxycodone HCl 10 MG TABS Take 1 tablet (10 mg total) by mouth 3 (three) times daily as needed. 90 tablet 0  . Pyridoxine HCl (VITAMIN B-6 PO) Take 1 tablet by mouth daily.    . ranitidine (ZANTAC) 300 MG tablet Take 1 tablet (300 mg total) by mouth at bedtime. 30 tablet 5  . Tofacitinib Citrate (XELJANZ XR) 11 MG TB24 Take by mouth daily.    . traMADol (ULTRAM) 50 MG tablet **06/06**TAKE 1 TABLET BY MOUTH 4 TIMES A DAY 120 tablet 1  . methotrexate (RHEUMATREX) 2.5 MG tablet TAKE 4 TABLETS (10 MG TOTAL) BY MOUTH ONCE A WEEK. CAUTION:CHEMOTHERAPY. PROTECT FROM LIGHT. 48 tablet 0  . predniSONE (DELTASONE) 1 MG tablet Take 4 tablets (4 mg total) by mouth daily with breakfast. 360 tablet 1  . Tofacitinib Citrate (XELJANZ XR) 11 MG TB24 Take 11 mg by mouth daily. 60 tablet 0   No current facility-administered medications on file prior to visit.     Past Medical History:  Diagnosis Date  . Anxiety    takes Valium daily as needed  . Bruises easily    d/t meds  . Cancer ([place])    basal cell ca, in situ- uterine   . Cataracts, bilateral   . Chronic back pain   . Dizziness    r/t to meds  . GERD (gastroesophageal reflux disease)    no meds on a regular basis but will take Tums if needed  . Headache(784.0)    r/t neck issues  . History of bronchitis 6-80yrs ago  . History of colon polyps    benign  . Hyperlipidemia    takes Atorvastatin on Mondays  and Fridays  . Hypertension    takes Ramipril daily  . Joint pain   . Joint swelling   . Multiple sclerosis ([place])   . Neuromuscular disorder ([place])    Dr. Park Liter- [facility], follows [facility]  . Nocturia   . OA (osteoarthritis)    Dr Jani [place] weekly, RA- hands- knees- feet   . PONV (postoperative nausea and vomiting)  trouble urinating after surgery in 2014  . Rheumatoid arthritis(714.0)    Dr Estanislado Pandy takes [name] daily  . Skin cancer   . Urinary retention    sees Dr.Wrenn about 2 times a yr    Past Surgical History:  Procedure Laterality Date  . ABDOMINAL HYSTERECTOMY     1985  . APPENDECTOMY     with TAH  . BACK SURGERY     several  . BROW LIFT Bilateral 10/02/2016   Procedure: BILATERAL LOWER LID BLEPHAROPLASTY;  Surgeon: Clista Bernhardt, MD;  Location: [facility];  Service: Plastics;  Laterality: Bilateral;  . CARPAL TUNNEL RELEASE Right   . cataracts    . CERVICAL FUSION     Dr Joya Salm  . CERVICAL FUSION  12/31/2012   Dr Joya Salm  . CHEST TUBE INSERTION     for traumatic Pneumothorax  . COLONOSCOPY  07/16/2010   normal   . COLONOSCOPY W/ POLYPECTOMY  1997   negative since; Dr Deatra Ina  . ESOPHAGOGASTRODUODENOSCOPY  07/16/2010   normal  . eye lid raise    . EYE SURGERY Bilateral    cataracts removed - /w IOL  . LUMBAR FUSION     Dr Joya Salm  . NASAL SINUS SURGERY    . POSTERIOR CERVICAL FUSION/FORAMINOTOMY N/A 12/31/2012   Procedure: POSTERIOR LATERAL CERVICAL FUSION/FORAMINOTOMY LEVEL 1 CERVICAL THREE-FOUR WITH LATERAL MASS SCREWS;  Surgeon: Floyce Stakes, MD;  Location: [facility];  Service: Neurosurgery;  Laterality: N/A;  . PTOSIS REPAIR Bilateral 10/02/2016   Procedure: INTERNAL PTOSIS REPAIR;  Surgeon: Clista Bernhardt, MD;  Location: [facility];  Service: Plastics;  Laterality: Bilateral;  . SKIN BIOPSY    . THYROID SURGERY     R lobe removed- 1972, has grown back - CT last done- 2018  . TOTAL HIP ARTHROPLASTY  12/22/2011   Procedure: TOTAL  HIP ARTHROPLASTY;  Surgeon: Kerin Salen, MD;  Location: [facility];  Service: Orthopedics;  Laterality: Left;  . TUBAL LIGATION      Social History   Socioeconomic History  . Marital status: Married    Spouse name: Not on file  . Number of children: Not on file  . Years of education: Not on file  . Highest education level: Not on file  Occupational History  . Occupation: retired  Scientific laboratory technician  . Financial resource strain: Not on file  . Food insecurity:    Worry: Not on file    Inability: Not on file  . Transportation needs:    Medical: Not on file    Non-medical: Not on file  Tobacco Use  . Smoking status: Former Smoker    Last attempt to quit: 05/26/1981    Years since quitting: 36.2  . Smokeless tobacco: Never Used  . Tobacco comment: Smoked [phone], up to 2.5 ppd  Substance and Sexual Activity  . Alcohol use: Yes    Alcohol/week: 0.0 oz    Comment: 2% beer  . Drug use: No  . Sexual activity: Yes  Lifestyle  . Physical activity:    Days per week: Not on file    Minutes per session: Not on file  . Stress: Not on file  Relationships  . Social connections:    Talks on phone: Not on file    Gets together: Not on file    Attends religious service: Not on file    Active member of club or organization: Not on file    Attends meetings of clubs or organizations: Not on  file    Relationship status: Not on file  Other Topics Concern  . Not on file  Social History Narrative  . Not on file    Family History  Problem Relation Age of Onset  . Cancer Mother        pancreatic  . Diabetes Mother   . Heart disease Father        Rheumatic  . Cancer Father        ? stomach  . Cancer Sister        stomach  . Cancer Maternal Aunt        X 4; ? primary  . Heart disease Paternal Aunt   . Cancer Maternal Grandmother        cervical  . Colon cancer Unknown        Aunts  . Multiple sclerosis Daughter   . Hypertension Neg Hx   . Stroke Neg Hx     Review of Systems    Constitutional: Positive for fatigue. Negative for appetite change, chills and fever.  HENT: Positive for rhinorrhea (left side only) and sinus pain (left maxillary sinus> right maxillary sinus). Negative for congestion, ear pain, postnasal drip, sinus pressure and sore throat.   Respiratory: Negative for cough, shortness of breath and wheezing.   Gastrointestinal: Negative for nausea.  Musculoskeletal: Positive for myalgias (more than usual).  Neurological: Positive for headaches.       Objective:   Vitals:   08/18/17 1032  BP: 136/70  Pulse: 71  Resp: 16  Temp: 98 F (36.7 C)  SpO2: 98%   Filed Weights   08/18/17 1032  Weight: 116 lb (52.6 kg)   Body mass index is 20.55 kg/m.  Wt Readings from Last 3 Encounters:  08/18/17 116 lb (52.6 kg)  08/06/17 116 lb (52.6 kg)  06/17/17 115 lb (52.2 kg)     Physical Exam GENERAL APPEARANCE: Appears stated age, well appearing, NAD EYES: conjunctiva clear, no icterus HEENT: bilateral tympanic membranes and right ear canal obstructed with wax, TM not visualized, left ear canal and TM normal, oropharynx with mild erythema, sore on roof of mouth - linear erythema with ? Blisters or ulcers, left face tender > right side of face, right sided thyromegaly, trachea midline, no cervical or supraclavicular lymphadenopathy LUNGS: Clear to auscultation without wheeze or crackles, unlabored breathing, good air entry bilaterally CARDIOVASCULAR: Normal S1,S2 without murmurs, no edema SKIN: warm, dry        Assessment & Plan:   See Problem List for Assessment and Plan of chronic medical problems.

## 2017-08-18 ENCOUNTER — Other Ambulatory Visit (INDEPENDENT_AMBULATORY_CARE_PROVIDER_SITE_OTHER): Payer: Medicare Other

## 2017-08-18 ENCOUNTER — Encounter: Payer: Self-pay | Admitting: Internal Medicine

## 2017-08-18 ENCOUNTER — Ambulatory Visit (INDEPENDENT_AMBULATORY_CARE_PROVIDER_SITE_OTHER): Payer: Medicare Other | Admitting: Internal Medicine

## 2017-08-18 VITALS — BP 136/70 | HR 71 | Temp 98.0°F | Resp 16 | Wt 116.0 lb

## 2017-08-18 DIAGNOSIS — E041 Nontoxic single thyroid nodule: Secondary | ICD-10-CM

## 2017-08-18 DIAGNOSIS — R220 Localized swelling, mass and lump, head: Secondary | ICD-10-CM | POA: Diagnosis not present

## 2017-08-18 DIAGNOSIS — J0100 Acute maxillary sinusitis, unspecified: Secondary | ICD-10-CM | POA: Diagnosis not present

## 2017-08-18 LAB — COMPREHENSIVE METABOLIC PANEL
ALBUMIN: 4.1 g/dL (ref 3.5–5.2)
ALK PHOS: 36 U/L — AB (ref 39–117)
ALT: 18 U/L (ref 0–35)
AST: 22 U/L (ref 0–37)
BUN: 11 mg/dL (ref 6–23)
CO2: 32 mEq/L (ref 19–32)
CREATININE: 0.89 mg/dL (ref 0.40–1.20)
Calcium: 9.7 mg/dL (ref 8.4–10.5)
Chloride: 102 mEq/L (ref 96–112)
GFR: 65.63 mL/min (ref >60.00)
Glucose, Bld: 106 mg/dL — ABNORMAL HIGH (ref 70–99)
POTASSIUM: 4.8 meq/L (ref 3.5–5.1)
SODIUM: 140 meq/L (ref 135–145)
TOTAL PROTEIN: 6.9 g/dL (ref 6.0–8.3)
Total Bilirubin: 0.5 mg/dL (ref 0.2–1.2)

## 2017-08-18 LAB — CBC WITH DIFFERENTIAL/PLATELET
BASOS PCT: 0.5 % (ref 0.0–3.0)
Basophils Absolute: 0 10*3/uL (ref 0.0–0.1)
EOS ABS: 0 10*3/uL (ref 0.0–0.7)
EOS PCT: 0.2 % (ref 0.0–5.0)
HCT: 38.2 % (ref 36.0–46.0)
Hemoglobin: 13.2 g/dL (ref 12.0–15.0)
LYMPHS PCT: 11.1 % — AB (ref 12.0–46.0)
Lymphs Abs: 0.7 10*3/uL (ref 0.7–4.0)
MCHC: 34.4 g/dL (ref 30.0–36.0)
MCV: 102.1 fl — ABNORMAL HIGH (ref 78.0–100.0)
Monocytes Absolute: 0.9 10*3/uL (ref 0.1–1.0)
Monocytes Relative: 13.5 % — ABNORMAL HIGH (ref 3.0–12.0)
NEUTROS ABS: 5 10*3/uL (ref 1.4–7.7)
Neutrophils Relative %: 74.7 % (ref 43.0–77.0)
PLATELETS: 275 10*3/uL (ref 150.0–400.0)
RBC: 3.75 Mil/uL — ABNORMAL LOW (ref 3.87–5.11)
RDW: 14.9 % (ref 11.5–15.5)
WBC: 6.7 10*3/uL (ref 4.0–10.5)

## 2017-08-18 LAB — TSH: TSH: 1.71 u[IU]/mL (ref 0.35–4.50)

## 2017-08-18 MED ORDER — VALACYCLOVIR HCL 1 G PO TABS: ORAL_TABLET | ORAL | 5 refills | Status: DC

## 2017-08-18 MED ORDER — DOXYCYCLINE HYCLATE 100 MG PO TABS: 100.0000 mg | ORAL_TABLET | Freq: Two times a day (BID) | ORAL | 0 refills | Status: DC

## 2017-08-18 NOTE — Assessment & Plan Note (Signed)
Left side of face slightly swollen, feels warm and is tender-the entire side of her face.  Is also having left-sided headaches Possible sinus infection, but somewhat atypical for sinus infection She also has swollen lesion left side of upper lip and left roof of the mouth without history of herpes Start doxycycline, Valtrex Will order CT to evaluate further to rule out bacterial abscess/atypical cellulitis

## 2017-08-18 NOTE — Assessment & Plan Note (Signed)
Right-sided thyroid larger and she does experience some constriction in her throat at times Recheck thyroid ultrasound-ordered

## 2017-08-18 NOTE — Assessment & Plan Note (Signed)
Symptoms somewhat consistent with a possible sinus infection, but are not completely typical of a sinus infection Concerned that there may be something else causing her symptoms given the facial swelling and warmth We will start doxycycline twice daily times 10 days Check CBC, CMP Will start Valtrex-?  Roof of mouth lesion herpes related.  If no improvement will need to see dentist/ENT

## 2017-08-18 NOTE — Addendum Note (Signed)
Addended by: Binnie Rail on: 08/18/2017 04:35 PM   Modules accepted: Orders

## 2017-08-18 NOTE — Patient Instructions (Addendum)
Have blood work done today.  Take the antibiotic and valtrex as prescribed.      Take over the counter cold medications as needed.    A thyroid ultrasound was ordered.   A Ct scan was ordered - someone will call you to schedule this.

## 2017-08-19 ENCOUNTER — Encounter: Payer: Self-pay | Admitting: Internal Medicine

## 2017-08-19 ENCOUNTER — Ambulatory Visit (INDEPENDENT_AMBULATORY_CARE_PROVIDER_SITE_OTHER)
Admission: RE | Admit: 2017-08-19 | Discharge: 2017-08-19 | Disposition: A | Discharge status: Home / Self Care | Payer: Medicare Other | Source: Ambulatory Visit | Attending: Internal Medicine | Admitting: Internal Medicine

## 2017-08-19 DIAGNOSIS — R220 Localized swelling, mass and lump, head: Secondary | ICD-10-CM

## 2017-08-19 DIAGNOSIS — R600 Localized edema: Secondary | ICD-10-CM | POA: Diagnosis not present

## 2017-08-19 MED ORDER — IOPAMIDOL (ISOVUE-300) INJECTION 61%
75.0000 mL | Freq: Once | INTRAVENOUS | Status: AC | PRN
  Administered 2017-08-19: 75 mL via INTRAVENOUS

## 2017-08-20 ENCOUNTER — Telehealth: Payer: Self-pay | Admitting: Rheumatology

## 2017-08-20 NOTE — Telephone Encounter (Signed)
Patient left a voicemail stating that she is waiting to pick up her handicap sticker forms from Dr. Estanislado Pandy that she dropped off at the office.

## 2017-08-24 NOTE — Telephone Encounter (Signed)
Patient advised she could come pick up forms tomorrow.

## 2017-08-28 ENCOUNTER — Ambulatory Visit
Admission: RE | Admit: 2017-08-28 | Discharge: 2017-08-28 | Disposition: A | Discharge status: Home / Self Care | Payer: Medicare Other | Source: Ambulatory Visit | Attending: Internal Medicine | Admitting: Internal Medicine

## 2017-08-28 DIAGNOSIS — E041 Nontoxic single thyroid nodule: Secondary | ICD-10-CM

## 2017-08-30 ENCOUNTER — Encounter: Payer: Self-pay | Admitting: Internal Medicine

## 2017-08-30 ENCOUNTER — Other Ambulatory Visit: Payer: Self-pay | Admitting: Internal Medicine

## 2017-08-30 DIAGNOSIS — E041 Nontoxic single thyroid nodule: Secondary | ICD-10-CM

## 2017-08-31 ENCOUNTER — Telehealth: Payer: Self-pay | Admitting: Internal Medicine

## 2017-08-31 ENCOUNTER — Other Ambulatory Visit: Payer: Self-pay

## 2017-08-31 DIAGNOSIS — Z79899 Other long term (current) drug therapy: Secondary | ICD-10-CM | POA: Diagnosis not present

## 2017-08-31 DIAGNOSIS — M810 Age-related osteoporosis without current pathological fracture: Secondary | ICD-10-CM

## 2017-08-31 NOTE — Telephone Encounter (Signed)
Spoke with pt to inform.  

## 2017-08-31 NOTE — Telephone Encounter (Signed)
Pt states she saw results of imaging (US thyroid) on MyChart over the weekend. She is requesting a call from office for further discussion. "Very concerned and anxious."

## 2017-09-01 DIAGNOSIS — G35 Multiple sclerosis: Secondary | ICD-10-CM | POA: Diagnosis not present

## 2017-09-01 DIAGNOSIS — M069 Rheumatoid arthritis, unspecified: Secondary | ICD-10-CM | POA: Diagnosis not present

## 2017-09-01 DIAGNOSIS — E041 Nontoxic single thyroid nodule: Secondary | ICD-10-CM | POA: Diagnosis not present

## 2017-09-01 DIAGNOSIS — Z87891 Personal history of nicotine dependence: Secondary | ICD-10-CM | POA: Diagnosis not present

## 2017-09-01 DIAGNOSIS — K219 Gastro-esophageal reflux disease without esophagitis: Secondary | ICD-10-CM | POA: Diagnosis not present

## 2017-09-01 NOTE — Telephone Encounter (Signed)
error 

## 2017-09-02 LAB — CBC WITH DIFFERENTIAL/PLATELET
Basophils Absolute: 21 cells/uL (ref 0–200)
Basophils Relative: 0.4 %
EOS PCT: 0.2 %
Eosinophils Absolute: 11 cells/uL — ABNORMAL LOW (ref 15–500)
HEMATOCRIT: 37.9 % (ref 35.0–45.0)
Hemoglobin: 13.4 g/dL (ref 11.7–15.5)
LYMPHS ABS: 504 {cells}/uL — AB (ref 850–3900)
MCH: 34.5 pg — AB (ref 27.0–33.0)
MCHC: 35.4 g/dL (ref 32.0–36.0)
MCV: 97.7 fL (ref 80.0–100.0)
MPV: 10 fL (ref 7.5–12.5)
Monocytes Relative: 8.3 %
NEUTROS PCT: 81.6 %
Neutro Abs: 4325 cells/uL (ref 1500–7800)
Platelets: 324 10*3/uL (ref 140–400)
RBC: 3.88 10*6/uL (ref 3.80–5.10)
RDW: 13.5 % (ref 11.0–15.0)
Total Lymphocyte: 9.5 %
WBC: 5.3 10*3/uL (ref 3.8–10.8)
WBCMIX: 440 {cells}/uL (ref 200–950)

## 2017-09-02 LAB — VITAMIN D 25 HYDROXY (VIT D DEFICIENCY, FRACTURES): VIT D 25 HYDROXY: 57 ng/mL (ref 30–100)

## 2017-09-02 LAB — QUANTIFERON-TB GOLD PLUS
NIL: 0.02 IU/mL
QUANTIFERON-TB GOLD PLUS: NEGATIVE

## 2017-09-02 LAB — COMPLETE METABOLIC PANEL WITH GFR
AG Ratio: 2.4 (calc) (ref 1.0–2.5)
ALBUMIN MSPROF: 4.6 g/dL (ref 3.6–5.1)
ALKALINE PHOSPHATASE (APISO): 40 U/L (ref 33–130)
ALT: 14 U/L (ref 6–29)
AST: 22 U/L (ref 10–35)
BILIRUBIN TOTAL: 0.6 mg/dL (ref 0.2–1.2)
BUN: 18 mg/dL (ref 7–25)
CHLORIDE: 100 mmol/L (ref 98–110)
CO2: 31 mmol/L (ref 20–32)
Calcium: 9.8 mg/dL (ref 8.6–10.4)
Creat: 0.93 mg/dL (ref 0.60–0.93)
GFR, Est African American: 70 mL/min/{1.73_m2} (ref >=60)
GFR, Est Non African American: 60 mL/min/{1.73_m2} (ref >=60)
GLUCOSE: 88 mg/dL (ref 65–99)
Globulin: 1.9 g/dL (calc) (ref 1.9–3.7)
Potassium: 4.8 mmol/L (ref 3.5–5.3)
SODIUM: 137 mmol/L (ref 135–146)
Total Protein: 6.5 g/dL (ref 6.1–8.1)

## 2017-09-02 NOTE — Progress Notes (Signed)
Absolute lymphocyte count is low.  We will continue to monitor for right now.

## 2017-09-07 ENCOUNTER — Other Ambulatory Visit: Payer: Self-pay | Admitting: Otolaryngology

## 2017-09-07 DIAGNOSIS — E041 Nontoxic single thyroid nodule: Secondary | ICD-10-CM

## 2017-09-10 ENCOUNTER — Encounter: Payer: Self-pay | Admitting: Internal Medicine

## 2017-09-10 ENCOUNTER — Ambulatory Visit (INDEPENDENT_AMBULATORY_CARE_PROVIDER_SITE_OTHER): Payer: Medicare Other | Admitting: Internal Medicine

## 2017-09-10 VITALS — BP 116/62 | HR 69 | Temp 98.0°F | Ht 63.0 in | Wt 117.0 lb

## 2017-09-10 DIAGNOSIS — R490 Dysphonia: Secondary | ICD-10-CM | POA: Diagnosis not present

## 2017-09-10 DIAGNOSIS — E041 Nontoxic single thyroid nodule: Secondary | ICD-10-CM | POA: Diagnosis not present

## 2017-09-10 MED ORDER — OMEPRAZOLE 40 MG PO CPDR: 40.0000 mg | DELAYED_RELEASE_CAPSULE | Freq: Two times a day (BID) | ORAL | 3 refills | Status: DC

## 2017-09-10 NOTE — Progress Notes (Signed)
   Subjective:    Patient ID: Sherri [name], female    DOB: 1940-09-13, 77 y.o.   MRN: [id number]  HPI The patient is a 77 YO female coming in irate about recent events in her health care. She started seeing her PCP about 1 month ago about hoarseness which is worsening over the last 1 year and swallowing problems same time course. She was treated for sinus infection and had both CT head/neck (no significant findings) as well as [place] thryoid (with nodule growing and suspicious appearing). She then was sent to ENT since not improving. She had a very negative experience with the doctor there. She was supposed to start taking prilosec 40 mg BID after that visit but the rx was not sent in right. She has tried to call them back several times and gotten no answer since April 5th. She needs capsules and they sent in pills. She has gotten over the counter and is taking but this is expensive and she wants rectified. She also was supposed to get a biopsy of thyroid nodule given that the ENT doctor told her "I don't lie this is probably cancer" which has caused her great emotional distress. She did not get called about scheduling that. Then after her calling them many times she was scheduled for May 7th. She was not happy with that and kept calling them and they were able to get it moved up to April 30th. She is not happy with this either. She has been very disappointed and wants this to be expedited if possible. She is very concerned about the possible cancer.   Review of Systems  Constitutional: Negative.   HENT: Positive for sore throat, trouble swallowing and voice change. Negative for congestion, facial swelling, hearing loss, mouth sores and nosebleeds.   Eyes: Negative.   Respiratory: Negative for cough, chest tightness and shortness of breath.   Cardiovascular: Negative for chest pain, palpitations and leg swelling.  Gastrointestinal: Negative for abdominal distention, abdominal pain, constipation,  diarrhea, nausea and vomiting.  Musculoskeletal: Negative.   Skin: Negative.   Neurological: Negative.   Psychiatric/Behavioral: Positive for dysphoric mood. The patient is nervous/anxious.       Objective:   Physical Exam  Constitutional: She is oriented to person, place, and time. She appears well-developed and well-nourished.  HENT:  Head: Normocephalic and atraumatic.  Eyes: EOM are normal.  Neck: Normal range of motion.  Scar on neck from prior surgery  Cardiovascular: Normal rate and regular rhythm.  Pulmonary/Chest: Effort normal and breath sounds normal. No respiratory distress. She has no wheezes. She has no rales.  Abdominal: Soft. Bowel sounds are normal. She exhibits no distension. There is no tenderness. There is no rebound.  Musculoskeletal: She exhibits no edema.  Neurological: She is alert and oriented to person, place, and time. Coordination normal.  Skin: Skin is warm and dry.  Psychiatric:  Distressed about recent events   Vitals:   09/10/17 0933  BP: 116/62  Pulse: 69  Temp: 98 F (36.7 C)  TempSrc: Oral  SpO2: 99%  Weight: 117 lb (53.1 kg)  Height: 5\' 3"  (1.6 m)      Assessment & Plan:  Visit time 25 minutes, greater than 50% of that time was spent in face to face counseling and coordination of care with the patient: counseled about her poor recent medical encounters, review of records.

## 2017-09-10 NOTE — Assessment & Plan Note (Signed)
Rx for prilosec capsules 40 mg BID per recent ENT note they did direct visualization and felt likely from GERD.

## 2017-09-10 NOTE — Assessment & Plan Note (Signed)
Ordered biopsy today to see if this can be expedited. Talked about likely course and possible surgery needed in future if the nodule is suspicious. Talked about need for observation and second biopsy depending on pathology results.

## 2017-09-10 NOTE — Patient Instructions (Signed)
We will get the biopsy reordered to see if we can get it sped up.  We have ordered the prilosec to take 40 mg twice a day today.

## 2017-09-17 ENCOUNTER — Ambulatory Visit
Admission: RE | Admit: 2017-09-17 | Discharge: 2017-09-17 | Disposition: A | Discharge status: Home / Self Care | Payer: Medicare Other | Source: Ambulatory Visit | Attending: Otolaryngology | Admitting: Otolaryngology

## 2017-09-17 ENCOUNTER — Other Ambulatory Visit (HOSPITAL_COMMUNITY)
Admission: RE | Admit: 2017-09-17 | Discharge: 2017-09-17 | Disposition: A | Discharge status: Home / Self Care | Payer: Medicare Other | Source: Ambulatory Visit | Attending: Radiology | Admitting: Radiology

## 2017-09-17 DIAGNOSIS — E041 Nontoxic single thyroid nodule: Secondary | ICD-10-CM | POA: Insufficient documentation

## 2017-09-18 ENCOUNTER — Encounter: Payer: Self-pay | Admitting: Internal Medicine

## 2017-09-22 ENCOUNTER — Telehealth: Payer: Self-pay | Admitting: Internal Medicine

## 2017-09-22 ENCOUNTER — Other Ambulatory Visit: Payer: Medicare Other

## 2017-09-22 NOTE — Telephone Encounter (Signed)
Spoke with pt to inform. Advised if she had any other questions to contact ENT.

## 2017-09-22 NOTE — Telephone Encounter (Signed)
Looks like this was done by another provider on 09/17/17

## 2017-09-22 NOTE — Telephone Encounter (Signed)
The biopsy is negative.  The ENT should call her to-or she can talk to him to make sure he feels that biopsy was sufficient enough.

## 2017-09-22 NOTE — Telephone Encounter (Signed)
Copied from [place] [id number]. Topic: Quick Communication - See Telephone Encounter >> Sep 22, 2017 10:22 AM Boyd Kerbs wrote: CRM for notification.   Pt. Calling about biopsy results.  Has not heard anything Please call pt. Back   See Telephone encounter for: 09/22/17.

## 2017-09-23 ENCOUNTER — Other Ambulatory Visit: Payer: Self-pay | Admitting: *Deleted

## 2017-09-23 MED ORDER — PREDNISONE 1 MG PO TABS: 4.0000 mg | ORAL_TABLET | Freq: Every day | ORAL | 1 refills | Status: DC

## 2017-09-23 NOTE — Telephone Encounter (Signed)
Refill request received via fax  Last Visit: 06/12/17 Next Visit: 11/05/17  Okay to refill per Dr. Estanislado Pandy

## 2017-09-24 ENCOUNTER — Ambulatory Visit: Payer: Medicare Other | Admitting: Gastroenterology

## 2017-09-29 ENCOUNTER — Other Ambulatory Visit: Payer: Medicare Other

## 2017-09-29 ENCOUNTER — Telehealth: Payer: Self-pay | Admitting: Neurology

## 2017-09-29 NOTE — Telephone Encounter (Signed)
LMOM clarifying that our office (the physicians) made a collective decision not to identify as a pain mx center.  We are not accepting new pain mx. pts.  Dr. Felecia Shelling will continue to treat Sherri [name] chronic  pain, as he also sees her for MS.  So sorry for the confusion--please call back if she has other questions/fim

## 2017-09-29 NOTE — Telephone Encounter (Signed)
Pt called said her daughter had an appt with today at the office and was advised Dr Felecia Shelling was going to d/c giving pain medication. She is wanting to speak with RN reg this.

## 2017-10-02 DIAGNOSIS — L570 Actinic keratosis: Secondary | ICD-10-CM | POA: Diagnosis not present

## 2017-10-02 DIAGNOSIS — D229 Melanocytic nevi, unspecified: Secondary | ICD-10-CM | POA: Diagnosis not present

## 2017-10-05 DIAGNOSIS — K219 Gastro-esophageal reflux disease without esophagitis: Secondary | ICD-10-CM | POA: Diagnosis not present

## 2017-10-05 DIAGNOSIS — R1313 Dysphagia, pharyngeal phase: Secondary | ICD-10-CM | POA: Diagnosis not present

## 2017-10-05 DIAGNOSIS — R490 Dysphonia: Secondary | ICD-10-CM | POA: Diagnosis not present

## 2017-10-06 ENCOUNTER — Other Ambulatory Visit: Payer: Self-pay | Admitting: Internal Medicine

## 2017-10-08 ENCOUNTER — Encounter: Payer: Self-pay | Admitting: Internal Medicine

## 2017-10-12 ENCOUNTER — Encounter: Payer: Self-pay | Admitting: Physician Assistant

## 2017-10-12 ENCOUNTER — Ambulatory Visit: Payer: Medicare Other | Admitting: Physician Assistant

## 2017-10-12 ENCOUNTER — Ambulatory Visit (INDEPENDENT_AMBULATORY_CARE_PROVIDER_SITE_OTHER): Payer: Self-pay

## 2017-10-12 VITALS — BP 112/70 | HR 67 | Resp 14 | Ht 63.0 in | Wt 119.0 lb

## 2017-10-12 DIAGNOSIS — M4312 Spondylolisthesis, cervical region: Secondary | ICD-10-CM | POA: Diagnosis not present

## 2017-10-12 DIAGNOSIS — M1611 Unilateral primary osteoarthritis, right hip: Secondary | ICD-10-CM

## 2017-10-12 DIAGNOSIS — Z96642 Presence of left artificial hip joint: Secondary | ICD-10-CM

## 2017-10-12 DIAGNOSIS — M5030 Other cervical disc degeneration, unspecified cervical region: Secondary | ICD-10-CM | POA: Diagnosis not present

## 2017-10-12 DIAGNOSIS — Z8669 Personal history of other diseases of the nervous system and sense organs: Secondary | ICD-10-CM | POA: Diagnosis not present

## 2017-10-12 DIAGNOSIS — Z79899 Other long term (current) drug therapy: Secondary | ICD-10-CM | POA: Diagnosis not present

## 2017-10-12 DIAGNOSIS — Z8659 Personal history of other mental and behavioral disorders: Secondary | ICD-10-CM | POA: Diagnosis not present

## 2017-10-12 DIAGNOSIS — R202 Paresthesia of skin: Secondary | ICD-10-CM

## 2017-10-12 DIAGNOSIS — M7061 Trochanteric bursitis, right hip: Secondary | ICD-10-CM

## 2017-10-12 DIAGNOSIS — M810 Age-related osteoporosis without current pathological fracture: Secondary | ICD-10-CM | POA: Diagnosis not present

## 2017-10-12 DIAGNOSIS — Z8639 Personal history of other endocrine, nutritional and metabolic disease: Secondary | ICD-10-CM

## 2017-10-12 DIAGNOSIS — F5101 Primary insomnia: Secondary | ICD-10-CM

## 2017-10-12 DIAGNOSIS — R5383 Other fatigue: Secondary | ICD-10-CM

## 2017-10-12 DIAGNOSIS — M0579 Rheumatoid arthritis with rheumatoid factor of multiple sites without organ or systems involvement: Secondary | ICD-10-CM | POA: Diagnosis not present

## 2017-10-12 DIAGNOSIS — Z8679 Personal history of other diseases of the circulatory system: Secondary | ICD-10-CM

## 2017-10-12 DIAGNOSIS — G35 Multiple sclerosis: Secondary | ICD-10-CM

## 2017-10-12 NOTE — Progress Notes (Signed)
Medication Samples have been provided to the patient.  Drug name: [name] XR        Strength: 11mg     Qty: 90 tablets LOT: [id number]  Exp.Date: 04/2018  Dosing instructions: Take 1 tablet by mouth daily.   The patient has been instructed regarding the correct time, dose, and frequency of taking this medication, including desired effects and most common side effects.   [name] 2:41 PM 10/12/2017

## 2017-10-12 NOTE — Progress Notes (Signed)
Office Visit Note  Patient: Sherri [name]             Date of Birth: 01/26/1941           MRN: [id number]             PCP: Binnie Rail, MD Referring: Binnie Rail, MD Visit Date: 10/12/2017 Occupation: @GUAROCC @    Subjective:  Neck pain   History of Present Illness: Sherri [name] is a 77 y.o. female with history of seropositive rheumatoid arthritis, osteoarthritis, osteoporosis, and DDD.  Patient continues to take Xeljanz 11 mg XR every day, methotrexate 4 tablets by mouth once weekly, and folic acid 1 tablet daily.  She states she is currently on prednisone 4 mg by mouth daily she feels essentially lowered her dose from 5 mg to 4 mg of prednisone she has been flaring more frequently.  She states that she currently is having pain in her bilateral hands, right wrist, and dorsal aspect of her right foot.  She feels as though her rheumatoid arthritis is progressing.  She denies any knee or hip pain at this time.  She has been taking tramadol up to 4 times daily and oxycodone at bedtime for pain relief. She reports that last Wednesday she developed pain starting around her left shoulder blade and radiating all the way down her left arm.  She states she continues to have good range of motion of her left shoulder.  She states she is experiencing numbness and tingling down her left arm with positional changes.  She states she also hears a grinding sensation in her neck.  She states that her range of motion with lateral rotation to the left has been more limited.  She states that she has had a C-spine fusion at C3-C4 and C4-C7, and she continues to have chronic pain.  She reports that the pain and numbness has been very severe especially at night.    Activities of Daily Living:  Patient reports morning stiffness for 2-3  hours.   Patient Reports nocturnal pain.  Difficulty dressing/grooming: Denies Difficulty climbing stairs: Denies Difficulty getting out of chair: Denies Difficulty  using hands for taps, buttons, cutlery, and/or writing: Reports   Review of Systems  Constitutional: Negative for fatigue.  HENT: Positive for mouth dryness. Negative for mouth sores and nose dryness.   Eyes: Positive for dryness. Negative for pain and visual disturbance.  Respiratory: Negative for cough, hemoptysis, shortness of breath and difficulty breathing.   Cardiovascular: Negative for chest pain, palpitations, hypertension and swelling in legs/feet.  Gastrointestinal: Positive for constipation. Negative for blood in stool and diarrhea.  Endocrine: Negative for increased urination.  Genitourinary: Negative for painful urination.  Musculoskeletal: Positive for arthralgias, joint pain, joint swelling, morning stiffness and muscle tenderness. Negative for myalgias, muscle weakness and myalgias.  Skin: Negative for color change, pallor, rash, hair loss, nodules/bumps, skin tightness, ulcers and sensitivity to sunlight.  Allergic/Immunologic: Negative for susceptible to infections.  Neurological: Negative for dizziness, numbness, headaches and weakness.  Hematological: Negative for swollen glands.  Psychiatric/Behavioral: Positive for depressed mood and sleep disturbance. The patient is nervous/anxious.     PMFS History:  Patient Active Problem List   Diagnosis Date Noted  . Localized swelling, mass, and lump of head 08/18/2017  . Acute non-recurrent maxillary sinusitis 08/18/2017  . Gastroesophageal reflux disease 08/06/2017  . Easy bruising 08/06/2017  . Anxiety 04/24/2017  . Mid back pain 03/26/2017  . Left sided chest pain 03/25/2017  .  Dyspnea on exertion 03/14/2017  . Hyperkalemia 03/13/2017  . Rash and nonspecific skin eruption 01/08/2017  . Submandibular gland swelling 01/08/2017  . High risk medication use 09/02/2016  . Urinary frequency 09/02/2016  . Piriformis muscle pain 09/02/2016  . Fatigue 07/22/2016  . Hair loss 07/22/2016  . Right hip pain 04/08/2016  .  Right knee pain 02/25/2016  . Dyspepsia 04/05/2015  . Numbness 11/14/2014  . Biceps tendon tear 10/10/2014  . Insomnia 10/10/2014  . Gait disturbance 06/13/2014  . 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 OP (osteoporosis) 01/18/2014  . Other long term (current) drug therapy 01/18/2014  . Other dysphagia 12/29/2013  . Hoarseness 12/29/2013  . 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 Personal history of colonic polyps 07/03/2010  . Nocturia 05/28/2010  . THYROID NODULE 05/17/2008  . Multiple sclerosis ([place]) 05/17/2008  . Chronic fatigue 03/30/2007  . Hyperlipidemia 08/20/2006  . Essential hypertension 08/20/2006  . Rheumatoid arthritis ([place]) 08/20/2006  . CERVICAL CANCER, HX OF 08/20/2006  . GASTRIC ULCER, HX OF 08/20/2006    Past Medical History:  Diagnosis Date  . Anxiety    takes Valium daily as needed  . Bruises easily    d/t meds  . Cancer ([place])    basal cell ca, in situ- uterine   . Cataracts, bilateral   . Chronic back pain   . Dizziness    r/t to meds  . GERD (gastroesophageal reflux disease)    no meds on a regular basis but will take Tums if needed  . Headache(784.0)    r/t neck issues  . History of bronchitis 6-69yrs ago  . History of colon polyps    benign  . Hyperlipidemia    takes Atorvastatin on Mondays and Fridays  . Hypertension    takes Ramipril daily  . Joint pain   . Joint swelling   . Multiple sclerosis ([place])   . Neuromuscular disorder ([place])    Dr. Park Liter- [facility], follows [facility]  . Nocturia   . OA (osteoarthritis)    Dr Jani Gravel weekly, RA- hands- knees- feet   . PONV (postoperative nausea and vomiting)    trouble urinating after surgery in 2014  . Rheumatoid arthritis(714.0)    Dr Estanislado Pandy takes [name] daily  . Skin cancer   . Urinary retention    sees Dr.Wrenn about 2 times a yr    Family History  Problem Relation Age of Onset  . Cancer Mother        pancreatic  . Diabetes Mother   . Heart  disease Father        Rheumatic  . Cancer Father        ? stomach  . Cancer Sister        stomach  . Cancer Maternal Aunt        X 4; ? primary  . Heart disease Paternal Aunt   . Cancer Maternal Grandmother        cervical  . Colon cancer Unknown        Aunts  . Multiple sclerosis Daughter   . Hypertension Neg Hx   . Stroke Neg Hx    Past Surgical History:  Procedure Laterality Date  . ABDOMINAL HYSTERECTOMY     1985  . APPENDECTOMY     with TAH  . BACK SURGERY     several  . BROW LIFT Bilateral 10/02/2016   Procedure: BILATERAL LOWER LID BLEPHAROPLASTY;  Surgeon: Clista Bernhardt, MD;  [facility];  Service: Clinical cytogeneticist;  Laterality: Bilateral;  . CARPAL TUNNEL RELEASE Right   . cataracts    . CERVICAL FUSION     Dr Joya Salm  . CERVICAL FUSION  12/31/2012   Dr Joya Salm  . CHEST TUBE INSERTION     for traumatic Pneumothorax  . COLONOSCOPY  07/16/2010   normal   . COLONOSCOPY W/ POLYPECTOMY  1997   negative since; Dr Deatra Ina  . ESOPHAGOGASTRODUODENOSCOPY  07/16/2010   normal  . eye lid raise    . EYE SURGERY Bilateral    cataracts removed - /w IOL  . LUMBAR FUSION     Dr Joya Salm  . NASAL SINUS SURGERY    . POSTERIOR CERVICAL FUSION/FORAMINOTOMY N/A 12/31/2012   Procedure: POSTERIOR LATERAL CERVICAL FUSION/FORAMINOTOMY LEVEL 1 CERVICAL THREE-FOUR WITH LATERAL MASS SCREWS;  Surgeon: Floyce Stakes, MD;  Location: [place] [facility];  Service: Neurosurgery;  Laterality: N/A;  . PTOSIS REPAIR Bilateral 10/02/2016   Procedure: INTERNAL PTOSIS REPAIR;  Surgeon: Clista Bernhardt, MD;  Location: [place];  Service: Plastics;  Laterality: Bilateral;  . SKIN BIOPSY    . THYROID SURGERY     R lobe removed- 1972, has grown back - CT last done- 2018  . TOTAL HIP ARTHROPLASTY  12/22/2011   Procedure: TOTAL HIP ARTHROPLASTY;  Surgeon: Kerin Salen, MD;  Location: [place];  Service: Orthopedics;  Laterality: Left;  . TUBAL LIGATION     Social History   Social History Narrative  . Not on  file     Objective: Vital Signs: BP 112/70 (BP Location: Left Arm, Patient Position: Sitting, Cuff Size: Normal)   Pulse 67   Resp 14   Ht 5\' 3"  (1.6 m)   Wt 119 lb (54 kg)   BMI 21.08 kg/m    Physical Exam  Constitutional: She is oriented to person, place, and time. She appears well-developed and well-nourished.  HENT:  Head: Normocephalic and atraumatic.  Eyes: Conjunctivae and EOM are normal.  Neck: Normal range of motion.  Cardiovascular: Normal rate, regular rhythm, normal heart sounds and intact distal pulses.  Pulmonary/Chest: Effort normal and breath sounds normal.  Abdominal: Soft. Bowel sounds are normal.  Lymphadenopathy:    She has no cervical adenopathy.  Neurological: She is alert and oriented to person, place, and time.  Skin: Skin is warm and dry. Capillary refill takes less than 2 seconds.  Psychiatric: She has a normal mood and affect. Her behavior is normal.  Nursing note and vitals reviewed.    Musculoskeletal Exam: C-spine very limited ROM with flexion and extension as well as with lateral rotation to the left. Her numbness and tingling are not reproducible with neck ROM.  No midline spinal tenderness.  No SI joint tenderness. Right shoulder good ROM. Shoulder joints good ROM with no discomfort. Left shoulder good ROM with no discomfort. Negative speed's test.  Negative empty can test.  Negative painful arc. Positive tinel's at ulnar nerve on left side.  Elbow joints, wrist joints, MCPs, PIPs, and DIPs good ROM.  She has synovitis of several MCPs and at the right ulnar styloid joint.  Hip joints, knee joints, ankle joints, MTPs, PIPs, DIPs good range of motion no synovitis.  No warmth or effusion of bilateral knees.  No tenderness of trochanteric bursa.  CDAI Exam: CDAI Homunculus Exam:   Joint Counts:  CDAI Tender Joint count: 0 CDAI Swollen Joint count: 0  Global Assessments:  Patient Global Assessment: 9 Provider Global Assessment: 9  CDAI  Calculated  Score: 18    Investigation: No additional findings. CBC Latest Ref Rng & Units 08/31/2017 08/18/2017 06/01/2017  WBC 3.8 - 10.8 Thousand/uL 5.3 6.7 6.2  Hemoglobin 11.7 - 15.5 g/dL 13.4 13.2 13.6  Hematocrit 35.0 - 45.0 % 37.9 38.2 39.1  Platelets 140 - 400 Thousand/uL 324 275.0 366   CMP Latest Ref Rng & Units 08/31/2017 08/18/2017 06/01/2017  Glucose 65 - 99 mg/dL 88 106(H) 94  BUN 7 - 25 mg/dL 18 11 14   Creatinine 0.60 - 0.93 mg/dL 0.93 0.89 0.85  Sodium 135 - 146 mmol/L 137 140 137  Potassium 3.5 - 5.3 mmol/L 4.8 4.8 4.7  Chloride 98 - 110 mmol/L 100 102 99  CO2 20 - 32 mmol/L 31 32 29  Calcium 8.6 - 10.4 mg/dL 9.8 9.7 9.8  Total Protein 6.1 - 8.1 g/dL 6.5 6.9 6.5  Total Bilirubin 0.2 - 1.2 mg/dL 0.6 0.5 0.3  Alkaline Phos 39 - 117 U/L - 36(L) -  AST 10 - 35 U/L 22 22 23   ALT 6 - 29 U/L 14 18 18      Imaging: [place] Fna Biopsy Thyroid 1st Lesion  Result Date: 09/17/2017 INDICATION: Indeterminate right mid thyroid nodule. Prior history of total thyroidectomy in 1972. EXAM: ULTRASOUND GUIDED FINE NEEDLE ASPIRATION OF INDETERMINATE RIGHT MID THYROID NODULE COMPARISON:  Thyroid ultrasound 08/28/2017 MEDICATIONS: None COMPLICATIONS: None immediate. TECHNIQUE: Informed written consent was obtained from the patient after a discussion of the risks, benefits and alternatives to treatment. Questions regarding the procedure were encouraged and answered. A timeout was performed prior to the initiation of the procedure. Pre-procedural ultrasound scanning demonstrated unchanged size and appearance of the indeterminate nodule within the right mid thyroid. The procedure was planned. The neck was prepped in the usual sterile fashion, and a sterile drape was applied covering the operative field. A timeout was performed prior to the initiation of the procedure. Local anesthesia was provided with 1% lidocaine. Under direct ultrasound guidance, 5 FNA biopsies were performed of the right mid thyroid nodule with a 25  gauge needle. Multiple ultrasound images were saved for procedural documentation purposes. The samples were prepared and submitted to pathology, including 2 separately for Afirma. Limited post procedural scanning was negative for hematoma or additional complication. Dressings were placed. The patient tolerated the above procedures procedure well without immediate postprocedural complication. FINDINGS: FINDINGS Nodule reference number based on prior diagnostic ultrasound: 1 Maximum size: 1.3 cm Location: Right  ;  Mid ACR TI-RADS total points: 7 ACR TI-RADS risk category:  TR5 (>/= 7 points) Prior biopsy:  No Reason for biopsy: meets ACR TI-RADS criteria Ultrasound imaging confirms appropriate placement of the needles within the thyroid nodule. IMPRESSION: Technically successful ultrasound guided fine needle aspiration of right mid thyroid nodule as described above. Read by: Ascencion Dike PA-C Electronically Signed   By: Aletta Edouard M.D.   On: 09/17/2017 15:12   Xr Cervical Spine With Flex & Extend  Result Date: 10/13/2017 AP lateral, flexion and extension views of cervical spine were reviewed.  She has posterior fusion of C3-4 anterior fusion of C4-5 C5-6 and C6-7.  Mild spondylolisthesis was noted between C3 and 4.   Speciality Comments: No specialty comments available.    Procedures:  No procedures performed Allergies: Darvon [propoxyphene hcl]; Meperidine hcl; Penicillins; Azathioprine; and Ezetimibe-simvastatin   Assessment / Plan:     Visit Diagnoses: Rheumatoid arthritis involving multiple sites with positive rheumatoid factor (Blue): She has synovitis of several MCPs and at the right  ulnar styloid.  She is currently on Prednisone 4 mg daily.  She reports that since tapering from Prednisone 5 mg to 4 mg she has been having more frequent and severe rheumatoid arthritis flares.  She continues to take Xeljanz 11 mg XR daily, MTX 4 tablets by mouth once a week, and folic acid 1 mg daily. Due to  her history of MS, she has been limited on treatment options.  Due to her increased neck pain and left sided radiculopathy a C-spine with flexion and extension was ordered today, which did not reveal any instability.  She was advised to increase her dose of Prednisone to 5 mg daily. She has a follow up appointment on 11/05/17.  High risk medication use - Xeljanz 11mg  XR qd, MTX 4 tabs po qwk, folic acid  mg po qd, prednisone 4 mg po qd. lipid panel: 04/24/18. TB gold, CBC, and CMP: 08/31/17  Paresthesia of left arm -She has chronic pain of her C-spine.  She has had a fusion at multiple levels including C3-C4 and C4-C7.  She has been having increased discomfort starting at the medial aspect of the left scapula as well as symptoms of left sided radiculopathy.  She has pain and numbness down her left arm with ROM of her neck and left shoulder. The numbness is most severe in her left 4th and 5th digits. She has full shoulder ROM with no discomfort.   She has negative speeds's, painful arc, Hawkin's, and empty can test.  She does have a positive Tinel's sign at the left ulnar nerve. Dr. Durward Fortes also evaluated this patient to identify the origins of her pain and paresthesia. A x-ray of her C-spine with flexion and extension was perform today to evaluate for C1-C2 instability due to her known history of RA.  A referral will also be placed for evaluation by Dr. Ernestina Patches.  I would like to schedule a nerve conduction velocity study.  If not origin of her pain is found, we will make a referral to a neurosurgeon since her previous surgeon retired. Plan: Ambulatory referral to Physical Medicine Rehab   Age-related osteoporosis without current pathological fracture: Her last DEXA was on 10/18/15. Her PCP Dr. Quay Burow orders her DEXA scans.    Primary osteoarthritis of right hip: She has no discomfort at this time.  She has good ROM.   Trochanteric bursitis, right hip: She has no tenderness at this time.   History of total  hip replacement, left: Doing well with no discomfort at this time.   DDD (degenerative disc disease), cervical - She has been having increased discomfort in her C-spine with left sided radiculopathy. X-ray was obtained to evaluate flexion and extension due to her history of RA.   Plan: XR Cervical Spine With Flex & Extend  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: Chronic pain.  She has very limited range of motion of the C-spine especially with flexion and extension.  She also has limited range of motion with lateral rotation to the left.  We obtain an x-ray today of her C-spine to evaluate flexion and extension.  Other medical conditions are listed as follows:   History of hypertension  History of anxiety  History of depression  History of hyperlipidemia  History of multiple sclerosis  Other fatigue  Primary insomnia     Orders: Orders Placed This Encounter  Procedures  . XR Cervical Spine With Flex & Extend  . Ambulatory referral to Physical Medicine Rehab   No orders of the  defined types were placed in this encounter.   Face-to-face time spent with patient was 30 minutes. >50% of time was spent in counseling and coordination of care.  Follow-Up Instructions: Return in about 2 weeks (around 10/26/2017) for Rheumatoid arthritis, Osteoporosis, DDD.   Ofilia Neas, PA-C  Note - This record has been created using Dragon software.  Chart creation errors have been sought, but may not always  have been located. Such creation errors do not reflect on  the standard of medical care.

## 2017-10-18 ENCOUNTER — Other Ambulatory Visit: Payer: Self-pay | Admitting: Rheumatology

## 2017-10-20 NOTE — Telephone Encounter (Signed)
Last visit: 10/12/2017 Next visit: 11/05/2017  Okay to refill per Dr. Estanislado Pandy.

## 2017-10-21 ENCOUNTER — Encounter: Payer: Self-pay | Admitting: Internal Medicine

## 2017-10-22 NOTE — Progress Notes (Signed)
Office Visit Note  Patient: Sherri [name]             Date of Birth: [birth year]           MRN: [id number]             PCP: Binnie Rail, MD Referring: Binnie Rail, MD Visit Date: 11/05/2017 Occupation: @GUAROCC @    Subjective:  Increased joint pain and swelling.   History of Present Illness: Sherri [name] is a 77 y.o. female with history of rheumatoid arthritis, osteoarthritis and DDD.  She states that she has been having increased pain and swelling in her joints for the last few months.  It got worse in the last 1 month.  Prednisone dose was increased to 5 mg p.o. daily.  She has been taking Xeljanz and methotrexate on a regular basis.  She is been also having increased pain and swelling in her left ankle.  She denies any change in her activity level.  Patient reports that she has been seeing Dr. Ernestina Patches for left-sided radiculopathy.  He believes that she has C7-T1 impingement.  Activities of Daily Living:  Patient reports morning stiffness for 2 hours.   Patient Reports nocturnal pain.  Difficulty dressing/grooming: Denies Difficulty climbing stairs: Denies Difficulty getting out of chair: Denies Difficulty using hands for taps, buttons, cutlery, and/or writing: Denies   Review of Systems  Constitutional: Positive for fatigue. Negative for activity change, night sweats, weight gain and weight loss.  HENT: Positive for trouble swallowing, trouble swallowing and mouth dryness. Negative for mouth sores and nose dryness.   Eyes: Positive for dryness. Negative for pain, redness and visual disturbance.  Respiratory: Negative for cough, shortness of breath and difficulty breathing.   Cardiovascular: Positive for swelling in legs/feet. Negative for chest pain, palpitations, hypertension and irregular heartbeat.  Gastrointestinal: Positive for constipation. Negative for blood in stool and diarrhea.  Endocrine: Positive for cold intolerance. Negative for increased urination.    Genitourinary: Positive for decreased urine output. Negative for vaginal dryness.  Musculoskeletal: Positive for arthralgias, gait problem, joint pain, joint swelling, muscle weakness, morning stiffness and muscle tenderness. Negative for myalgias and myalgias.  Skin: Negative for color change, rash, hair loss, skin tightness, ulcers and sensitivity to sunlight.  Allergic/Immunologic: Negative for susceptible to infections.  Neurological: Positive for dizziness, light-headedness, numbness and weakness. Negative for memory loss and night sweats.  Hematological: Positive for bruising/bleeding tendency. Negative for swollen glands.  Psychiatric/Behavioral: Positive for sleep disturbance. Negative for depressed mood. The patient is not nervous/anxious.     PMFS History:  Patient Active Problem List   Diagnosis Date Noted  . Swelling of left foot 10/26/2017  . Dysphagia 10/26/2017  . Left ankle swelling 10/26/2017  . Localized swelling, mass, and lump of head 08/18/2017  . Gastroesophageal reflux disease 08/06/2017  . Easy bruising 08/06/2017  . Anxiety 04/24/2017  . Mid back pain 03/26/2017  . Left sided chest pain 03/25/2017  . Dyspnea on exertion 03/14/2017  . Hyperkalemia 03/13/2017  . Rash and nonspecific skin eruption 01/08/2017  . Submandibular gland swelling 01/08/2017  . High risk medication use 09/02/2016  . Urinary frequency 09/02/2016  . Piriformis muscle pain 09/02/2016  . Fatigue 07/22/2016  . Hair loss 07/22/2016  . Right hip pain 04/08/2016  . Right knee pain 02/25/2016  . Dyspepsia 04/05/2015  . Numbness 11/14/2014  . Biceps tendon tear 10/10/2014  . Insomnia 10/10/2014  . Gait disturbance 06/13/2014  . 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  OP (osteoporosis) 01/18/2014  . Other long term (current) drug therapy 01/18/2014  . Other dysphagia 12/29/2013  . Hoarseness 12/29/2013  . 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 Personal history of colonic  polyps 07/03/2010  . Nocturia 05/28/2010  . THYROID NODULE 05/17/2008  . Multiple sclerosis ([place]) 05/17/2008  . Chronic fatigue 03/30/2007  . Hyperlipidemia 08/20/2006  . Essential hypertension 08/20/2006  . Rheumatoid arthritis ([place]) 08/20/2006  . CERVICAL CANCER, HX OF 08/20/2006  . GASTRIC ULCER, HX OF 08/20/2006    Past Medical History:  Diagnosis Date  . Anxiety    takes Valium daily as needed  . Bruises easily    d/t meds  . Cancer ([place])    basal cell ca, in situ- uterine   . Cataracts, bilateral   . Chronic back pain   . Dizziness    r/t to meds  . GERD (gastroesophageal reflux disease)    no meds on a regular basis but will take Tums if needed  . Headache(784.0)    r/t neck issues  . History of bronchitis 6-66yrs ago  . History of colon polyps    benign  . Hyperlipidemia    takes Atorvastatin on Mondays and Fridays  . Hypertension    takes Ramipril daily  . Joint pain   . Joint swelling   . Multiple sclerosis ([place])   . Neuromuscular disorder ([place])    Dr. Park Liter- [facility], follows M.S.  . Nocturia   . OA (osteoarthritis)    Dr Jani Gravel weekly, RA- hands- knees- feet   . PONV (postoperative nausea and vomiting)    trouble urinating after surgery in 2014  . Rheumatoid arthritis(714.0)    Dr Estanislado Pandy takes [name] daily  . Skin cancer   . Urinary retention    sees Dr.Wrenn about 2 times a yr    Family History  Problem Relation Age of Onset  . Cancer Mother        pancreatic  . Diabetes Mother   . Heart disease Father        Rheumatic  . Cancer Father        ? stomach  . Cancer Sister        stomach  . Cancer Maternal Aunt        X 4; ? primary  . Heart disease Paternal Aunt   . Cancer Maternal Grandmother        cervical  . Colon cancer Unknown        Aunts  . Multiple sclerosis Daughter   . Hypertension Neg Hx   . Stroke Neg Hx    Past Surgical History:  Procedure Laterality Date  . ABDOMINAL HYSTERECTOMY     1985    . APPENDECTOMY     with TAH  . BACK SURGERY     several  . BROW LIFT Bilateral 10/02/2016   Procedure: BILATERAL LOWER LID BLEPHAROPLASTY;  Surgeon: Clista Bernhardt, MD;  Location: [place];  Service: Plastics;  Laterality: Bilateral;  . CARPAL TUNNEL RELEASE Right   . cataracts    . CERVICAL FUSION     Dr Joya Salm  . CERVICAL FUSION  12/31/2012   Dr Joya Salm  . CHEST TUBE INSERTION     for traumatic Pneumothorax  . COLONOSCOPY  07/16/2010   normal   . COLONOSCOPY W/ POLYPECTOMY  1997   negative since; Dr Deatra Ina  . ESOPHAGOGASTRODUODENOSCOPY  07/16/2010   normal  . eye lid raise    . EYE  SURGERY Bilateral    cataracts removed - /w IOL  . JOINT REPLACEMENT    . LUMBAR FUSION     Dr Joya Salm  . NASAL SINUS SURGERY    . POSTERIOR CERVICAL FUSION/FORAMINOTOMY N/A 12/31/2012   Procedure: POSTERIOR LATERAL CERVICAL FUSION/FORAMINOTOMY LEVEL 1 CERVICAL THREE-FOUR WITH LATERAL MASS SCREWS;  Surgeon: Floyce Stakes, MD;  Location: [facility];  Service: Neurosurgery;  Laterality: N/A;  . PTOSIS REPAIR Bilateral 10/02/2016   Procedure: INTERNAL PTOSIS REPAIR;  Surgeon: Clista Bernhardt, MD;  Location: [place];  Service: Plastics;  Laterality: Bilateral;  . SKIN BIOPSY    . THYROID SURGERY     R lobe removed- 1972, has grown back - CT last done- 2018  . TOTAL HIP ARTHROPLASTY  12/22/2011   Procedure: TOTAL HIP ARTHROPLASTY;  Surgeon: Kerin Salen, MD;  Location: [place];  Service: Orthopedics;  Laterality: Left;  . TOTAL SHOULDER ARTHROPLASTY    . TUBAL LIGATION     Social History   Social History Narrative  . Not on file     Objective: Vital Signs: BP 115/67 (BP Location: Left Arm, Patient Position: Sitting, Cuff Size: Normal)   Pulse 75   Resp 18   Ht 5\' 3"  (1.6 m)   Wt 117 lb (53.1 kg)   BMI 20.73 kg/m    Physical Exam  Constitutional: She is oriented to person, place, and time. She appears well-developed and well-nourished.  HENT:  Head: Normocephalic and atraumatic.  Eyes:  Conjunctivae and EOM are normal.  Neck: Normal range of motion.  Cardiovascular: Normal rate, regular rhythm, normal heart sounds and intact distal pulses.  Pulmonary/Chest: Effort normal and breath sounds normal.  Abdominal: Soft. Bowel sounds are normal.  Lymphadenopathy:    She has no cervical adenopathy.  Neurological: She is alert and oriented to person, place, and time.  Skin: Skin is warm and dry. Capillary refill takes less than 2 seconds.  Psychiatric: She has a normal mood and affect. Her behavior is normal.  Nursing note and vitals reviewed.    Musculoskeletal Exam: She has limited range of motion of her cervical, thoracic and lumbar spine.  She shoulder joints and elbow joints were in good range of motion.  She has synovial thickening over wrist joints with some discomfort on palpation of her right wrist.  Synovitis over MCPs and PIPs was noted as described below.  She had good range of motion of her hip joints and knee joints.  She has pain and swelling over her left ankle joint.  CDAI Exam: CDAI Homunculus Exam:   Tenderness:  RUE: wrist Right hand: 2nd MCP, 3rd MCP and 4th PIP Left hand: 2nd MCP, 2nd PIP and 4th PIP LLE: tibiotalar  Swelling:  Right hand: 2nd MCP, 3rd MCP and 4th PIP Left hand: 2nd MCP, 2nd PIP and 4th PIP LLE: tibiotalar  Joint Counts:  CDAI Tender Joint count: 7 CDAI Swollen Joint count: 6  Global Assessments:  Patient Global Assessment: 8 Provider Global Assessment: 8  CDAI Calculated Score: 29    Investigation: No additional findings. CBC Latest Ref Rng & Units 10/26/2017 08/31/2017 08/18/2017  WBC 4.0 - 10.5 K/uL 5.2 5.3 6.7  Hemoglobin 12.0 - 15.0 g/dL 12.5 13.4 13.2  Hematocrit 36.0 - 46.0 % 36.4 37.9 38.2  Platelets 150.0 - 400.0 K/uL 320.0 324 275.0   CMP Latest Ref Rng & Units 10/26/2017 08/31/2017 08/18/2017  Glucose 70 - 99 mg/dL 95 88 106(H)  BUN 6 - 23 mg/dL  9 18 11   Creatinine 0.40 - 1.20 mg/dL 0.83 0.93 0.89  Sodium 135 - 145  mEq/L 139 137 140  Potassium 3.5 - 5.1 mEq/L 4.2 4.8 4.8  Chloride 96 - 112 mEq/L 99 100 102  CO2 19 - 32 mEq/L 33(H) 31 32  Calcium 8.4 - 10.5 mg/dL 9.9 9.8 9.7  Total Protein 6.0 - 8.3 g/dL 6.7 6.5 6.9  Total Bilirubin 0.2 - 1.2 mg/dL 0.4 0.6 0.5  Alkaline Phos 39 - 117 U/L 47 - 36(L)  AST 0 - 37 U/L 29 22 22   ALT 0 - 35 U/L 20 14 18     Imaging: US Thyroid Biopsy  Result Date: 10/29/2017 INDICATION: Indeterminate thyroid nodule 77 year old female with a history of prior right thyroidectomy for a cystic lesion. Over time, some residual right thyroidal tissue has regrown into an identifiable right thyroid gland. She now has a TI-RADS category 5 lesion which was previously biopsied on 09/17/2017. Unfortunately, only scant epithelial material was evident on the specimen consistent with a [place]da 1, nondiagnostic aspiration. She presents today for repeat biopsy with Afirma. EXAM: ULTRASOUND GUIDED FINE NEEDLE ASPIRATION OF INDETERMINATE THYROID NODULE COMPARISON:  Prior thyroid ultrasound 08/28/2017; prior thyroid biopsy 09/17/2016 MEDICATIONS: None COMPLICATIONS: None immediate. TECHNIQUE: Informed written consent was obtained from the patient after a discussion of the risks, benefits and alternatives to treatment. Questions regarding the procedure were encouraged and answered. A timeout was performed prior to the initiation of the procedure. Pre-procedural ultrasound scanning demonstrated unchanged size and appearance of the indeterminate nodule within the right mid gland. On today's examination, the nodule appears to represent a cluster of very small cystic spaces rather than a true solid nodule. The procedure was planned. The neck was prepped in the usual sterile fashion, and a sterile drape was applied covering the operative field. A timeout was performed prior to the initiation of the procedure. Local anesthesia was provided with 1% lidocaine. Under direct ultrasound guidance, 4 FNA biopsies were  performed of the nodule with a 25 gauge needle. Multiple ultrasound images were saved for procedural documentation purposes. The samples were prepared and submitted to pathology. Afirma sampling was also obtained. Limited post procedural scanning was negative for hematoma or additional complication. Dressings were placed. The patient tolerated the above procedures procedure well without immediate postprocedural complication. FINDINGS: Nodule reference number based on prior diagnostic ultrasound: 1 Maximum size: 1.3 cm Location: Right; Mid ACR TI-RADS risk category: TR5 (>/= 7 points) Reason for biopsy: nondiagnostic on prior biopsy Ultrasound imaging confirms appropriate placement of the needles within the thyroid nodule. IMPRESSION: Technically successful ultrasound guided fine needle aspiration of the right mid gland thyroid nodule. Of note, on today's ultrasound, the nodule appears to represent a small collection of micro cystic spaces rather than a true solid nodule. This may explain the prior nondiagnostic biopsy specimen with only scant epithelial cells. If today's biopsy is again nondiagnostic, consider surveillance rather than a third attempt at biopsy. Electronically Signed   By: Jacqulynn Cadet M.D.   On: 10/29/2017 11:43   Xr Cervical Spine With Flex & Extend  Result Date: 10/13/2017 AP lateral, flexion and extension views of cervical spine were reviewed.  She has posterior fusion of C3-4 anterior fusion of C4-5 C5-6 and C6-7.  Mild spondylolisthesis was noted between C3 and 4.   Speciality Comments: No specialty comments available.    Procedures:  No procedures performed Allergies: Darvon [propoxyphene hcl]; Meperidine hcl; Penicillins; Azathioprine; and Ezetimibe-simvastatin   Assessment / Plan:  Visit Diagnoses: Rheumatoid arthritis involving multiple sites with positive rheumatoid factor (HCC)-patient is having severe flare with increased pain and discomfort in her hands and left  ankle.  She is having problems with mobility.  We had detailed discussion regarding different treatment options.  At this point we will continue Xeljanz 11 mg XR and increase methotrexate to 6 tablets p.o. weekly.  I will check labs in 2 weeks and then every 2 months to monitor for drug toxicity.  I also discussed the option of subcu methotrexate but she would like to hold off at this point.  For the flare I will call in prednisone 5 mg tablets she will take 10 mg p.o. daily for a week and then taper by 2.5 mg every week until she reaches 5 mg.  High risk medication use - Xeljanz 11mg  XR qd, MTX 4 tabs po qwk, folic acid  mg po qd, prednisone 4 mg po qd. her labs have been stable.  Age-related osteoporosis without current pathological fracture - Her last DEXA was on 10/18/15. Her PCP Dr. Quay Burow orders her DEXA scans.    Primary osteoarthritis of right hip-she has some limitation of range of motion.  History of total hip replacement, left-he has minimal discomfort in her left hip.  Acute left ankle pain-she has pain and swelling in her left ankle joints due to RA flare.  I offered cortisone injection but she declined.  DDD (degenerative disc disease), cervical-patient was seen by Dr. Ernestina Patches who recommended MRI of her cervical spine due to C7-T1 radiculopathy.  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  Other fatigue  Primary insomnia  History of multiple sclerosis  History of depression  History of anxiety  History of hyperlipidemia  History of hypertension    Orders: Orders Placed This Encounter  Procedures  . MR CERVICAL SPINE WO CONTRAST   Meds ordered this encounter  Medications  . methotrexate (RHEUMATREX) 2.5 MG tablet    Sig: Take 6 tablets by mount once weekly. Caution:Chemotherapy. Protect from light.    Dispense:  72 tablet    Refill:  0  . predniSONE (DELTASONE) 5 MG tablet    Sig: Take 2 tablets (10 mg) by mouth daily with breakfast x 1 week, then take 1 1/2  tablets by mouth daily x 1 week, then take 1 tablet by mouth daily x 1 week.    Dispense:  25 tablet    Refill:  0    Face-to-face time spent with patient was 30 minutes. >50% of time was spent in counseling and coordination of care.  Follow-Up Instructions: Return in about 3 months (around 02/05/2018) for Rheumatoid arthritis, Osteoarthritis.   Bo Merino, MD  Note - This record has been created using Editor, commissioning.  Chart creation errors have been sought, but may not always  have been located. Such creation errors do not reflect on  the standard of medical care.

## 2017-10-25 NOTE — Progress Notes (Signed)
Subjective:    Patient ID: Sherri [name], female    DOB: 12-Feb-1941, 77 y.o.   MRN: [id number]  HPI The patient is here for follow up.  Thyroid nodule: She was referred to ENT and did have a biopsy, but the biopsy was insufficient.  He recommended that she go to [facility] to have the biopsy done, but she has not heard from his office regarding this.  She is frustrated because it is taking months to get this evaluated/resolved.  In the meantime she is feeling worse and worse.  She states that difficulty eating-food gets stuck in her throat and she is concerned that it is the thyroid.  She has gained weight for no reason.  She has had cold intolerance and typically was not cold.  She is also having other symptoms and she is unsure if it is related to the thyroid or not.  Numbness in her left arm: An EMG was ordered by her rheumatologist and this is scheduled for later this month.  Hypertension: We had switched her medication a while ago from ramipril to carvedilol.  Bystolic was too expensive and she was not able to afford that.  The ramipril was stopped secondary to hyperkalemia.  The carvedilol does not seem to be working because her blood pressure is constantly elevated and she is concerned that may be related to the thyroid.  She states on occasion she does forget the second dose and would prefer a medication she can take once a day.  Left leg-ankle edema: She has been experiencing swelling in her left foot and ankle.  Her ankle hurts.  She denies any injuries.  She feels like the swelling and pain is focused in the ankle, but denies any other joint issues and does not feel as a flare of her arthritis.  She denies any swelling in the right leg or ankle.  She has not felt well since January and is frustrated that she continues to not feel well.  Medications and allergies reviewed with patient and updated if appropriate.  Patient Active Problem List   Diagnosis Date Noted  . Localized  swelling, mass, and lump of head 08/18/2017  . Acute non-recurrent maxillary sinusitis 08/18/2017  . Gastroesophageal reflux disease 08/06/2017  . Easy bruising 08/06/2017  . Anxiety 04/24/2017  . Mid back pain 03/26/2017  . Left sided chest pain 03/25/2017  . Dyspnea on exertion 03/14/2017  . Hyperkalemia 03/13/2017  . Rash and nonspecific skin eruption 01/08/2017  . Submandibular gland swelling 01/08/2017  . High risk medication use 09/02/2016  . Urinary frequency 09/02/2016  . Piriformis muscle pain 09/02/2016  . Fatigue 07/22/2016  . Hair loss 07/22/2016  . Right hip pain 04/08/2016  . Right knee pain 02/25/2016  . Dyspepsia 04/05/2015  . Numbness 11/14/2014  . Biceps tendon tear 10/10/2014  . Insomnia 10/10/2014  . Gait disturbance 06/13/2014  . 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 OP (osteoporosis) 01/18/2014  . Other long term (current) drug therapy 01/18/2014  . Other dysphagia 12/29/2013  . Hoarseness 12/29/2013  . 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 Personal history of colonic polyps 07/03/2010  . Nocturia 05/28/2010  . THYROID NODULE 05/17/2008  . Multiple sclerosis ([place]) 05/17/2008  . Chronic fatigue 03/30/2007  . Hyperlipidemia 08/20/2006  . Essential hypertension 08/20/2006  . Rheumatoid arthritis ([place]) 08/20/2006  . CERVICAL CANCER, HX OF 08/20/2006  . GASTRIC ULCER, HX OF 08/20/2006    Current Outpatient Medications on File Prior to  Visit  Medication Sig Dispense Refill  . atorvastatin (LIPITOR) 10 MG tablet TAKE 1 TABLET BY MOUTH ON MONDAYS AND FRIDAY 27 tablet 2  . Biotin 5000 MCG TABS Take 5,000 mcg by mouth daily.     . Calcium Carbonate-Vitamin D (CALCIUM + D PO) Take 1 tablet by mouth daily.     . Carboxymethylcellul-Glycerin (LUBRICATING EYE DROPS OP) Apply 1 drop to eye daily as needed (dry eyes).    . Cholecalciferol (VITAMIN D3) 2000 units capsule Take 2,000 Units by mouth daily.    . Cyanocobalamin (VITAMIN B-12 PO) Take  1 tablet by mouth daily.    . diazepam (VALIUM) 5 MG tablet Take 5 mg by mouth as needed for anxiety.     . diclofenac sodium (VOLTAREN) 1 % GEL APPLY 3 GRAMS TO 3 LARGE JOINTS 3 TIMES A DAY AS NEEDED 932 g 0  . folic acid (FOLVITE) 1 MG tablet TAKE 1 TABLET BY MOUTH EVERY DAY 90 tablet 4  . furosemide (LASIX) 20 MG tablet Take 1 tablet (20 mg total) by mouth daily. -- Office visit needed for further refills 90 tablet 0  . gabapentin (NEURONTIN) 100 MG capsule Take 1 capsule in AM and take 2 capsules in PM 270 capsule 0  . omeprazole (PRILOSEC) 40 MG capsule Take 1 capsule (40 mg total) by mouth 2 (two) times daily. 60 capsule 3  . predniSONE (DELTASONE) 1 MG tablet Take 4 tablets (4 mg total) by mouth daily with breakfast. 360 tablet 1  . Pyridoxine HCl (VITAMIN B-6 PO) Take 1 tablet by mouth daily.    . Tofacitinib Citrate (XELJANZ XR) 11 MG TB24 Take by mouth daily.    . traMADol (ULTRAM) 50 MG tablet **06/06**TAKE 1 TABLET BY MOUTH 4 TIMES A DAY 120 tablet 1  . methotrexate (RHEUMATREX) 2.5 MG tablet TAKE 4 TABLETS (10 MG TOTAL) BY MOUTH ONCE A WEEK. CAUTION:CHEMOTHERAPY. PROTECT FROM LIGHT. 48 tablet 0   No current facility-administered medications on file prior to visit.     Past Medical History:  Diagnosis Date  . Anxiety    takes Valium daily as needed  . Bruises easily    d/t meds  . Cancer ([place])    basal cell ca, in situ- uterine   . Cataracts, bilateral   . Chronic back pain   . Dizziness    r/t to meds  . GERD (gastroesophageal reflux disease)    no meds on a regular basis but will take Tums if needed  . Headache(784.0)    r/t neck issues  . History of bronchitis 6-107yrs ago  . History of colon polyps    benign  . Hyperlipidemia    takes Atorvastatin on Mondays and Fridays  . Hypertension    takes Ramipril daily  . Joint pain   . Joint swelling   . Multiple sclerosis ([place])   . Neuromuscular disorder ([place])    Dr. Park Liter- [facility], follows [facility]  .  Nocturia   . OA (osteoarthritis)    Dr Jani Gravel weekly, RA- hands- knees- feet   . PONV (postoperative nausea and vomiting)    trouble urinating after surgery in 2014  . Rheumatoid arthritis(714.0)    Dr Estanislado Pandy takes [name] daily  . Skin cancer   . Urinary retention    sees Dr.Wrenn about 2 times a yr    Past Surgical History:  Procedure Laterality Date  . ABDOMINAL HYSTERECTOMY     1985  . APPENDECTOMY  with TAH  . BACK SURGERY     several  . BROW LIFT Bilateral 10/02/2016   Procedure: BILATERAL LOWER LID BLEPHAROPLASTY;  Surgeon: Clista Bernhardt, MD;  Location: [facility];  Service: Plastics;  Laterality: Bilateral;  . CARPAL TUNNEL RELEASE Right   . cataracts    . CERVICAL FUSION     Dr Joya Salm  . CERVICAL FUSION  12/31/2012   Dr Joya Salm  . CHEST TUBE INSERTION     for traumatic Pneumothorax  . COLONOSCOPY  07/16/2010   normal   . COLONOSCOPY W/ POLYPECTOMY  1997   negative since; Dr Deatra Ina  . ESOPHAGOGASTRODUODENOSCOPY  07/16/2010   normal  . eye lid raise    . EYE SURGERY Bilateral    cataracts removed - /w IOL  . LUMBAR FUSION     Dr Joya Salm  . NASAL SINUS SURGERY    . POSTERIOR CERVICAL FUSION/FORAMINOTOMY N/A 12/31/2012   Procedure: POSTERIOR LATERAL CERVICAL FUSION/FORAMINOTOMY LEVEL 1 CERVICAL THREE-FOUR WITH LATERAL MASS SCREWS;  Surgeon: Floyce Stakes, MD;  Location: [facility];  Service: Neurosurgery;  Laterality: N/A;  . PTOSIS REPAIR Bilateral 10/02/2016   Procedure: INTERNAL PTOSIS REPAIR;  Surgeon: Clista Bernhardt, MD;  Location: [facility];  Service: Plastics;  Laterality: Bilateral;  . SKIN BIOPSY    . THYROID SURGERY     R lobe removed- 1972, has grown back - CT last done- 2018  . TOTAL HIP ARTHROPLASTY  12/22/2011   Procedure: TOTAL HIP ARTHROPLASTY;  Surgeon: Kerin Salen, MD;  Location: [facility];  Service: Orthopedics;  Laterality: Left;  . TUBAL LIGATION      Social History   Socioeconomic History  . Marital status: Married     Spouse name: Not on file  . Number of children: Not on file  . Years of education: Not on file  . Highest education level: Not on file  Occupational History  . Occupation: retired  Scientific laboratory technician  . Financial resource strain: Not on file  . Food insecurity:    Worry: Not on file    Inability: Not on file  . Transportation needs:    Medical: Not on file    Non-medical: Not on file  Tobacco Use  . Smoking status: Former Smoker    Last attempt to quit: 05/26/1981    Years since quitting: 36.4  . Smokeless tobacco: Never Used  . Tobacco comment: Smoked [phone], up to 2.5 ppd  Substance and Sexual Activity  . Alcohol use: Yes    Alcohol/week: 0.0 oz    Comment: 2% beer  . Drug use: Never  . Sexual activity: Yes  Lifestyle  . Physical activity:    Days per week: Not on file    Minutes per session: Not on file  . Stress: Not on file  Relationships  . Social connections:    Talks on phone: Not on file    Gets together: Not on file    Attends religious service: Not on file    Active member of club or organization: Not on file    Attends meetings of clubs or organizations: Not on file    Relationship status: Not on file  Other Topics Concern  . Not on file  Social History Narrative  . Not on file    Family History  Problem Relation Age of Onset  . Cancer Mother        pancreatic  . Diabetes Mother   . Heart disease Father  Rheumatic  . Cancer Father        ? stomach  . Cancer Sister        stomach  . Cancer Maternal Aunt        X 4; ? primary  . Heart disease Paternal Aunt   . Cancer Maternal Grandmother        cervical  . Colon cancer Unknown        Aunts  . Multiple sclerosis Daughter   . Hypertension Neg Hx   . Stroke Neg Hx     Review of Systems  Constitutional: Positive for unexpected weight change. Negative for chills and fever.  HENT: Positive for trouble swallowing.   Respiratory: Positive for shortness of breath (at times related to thyroid  ?). Negative for cough and wheezing.   Cardiovascular: Positive for chest pain (muscular ) and leg swelling. Negative for palpitations.  Endocrine: Positive for cold intolerance.  Neurological: Positive for light-headedness. Negative for headaches.       Objective:   Vitals:   10/26/17 1346  BP: (!) 148/80  Pulse: 74  Resp: 16  Temp: 97.9 F (36.6 C)  SpO2: 96%   BP Readings from Last 3 Encounters:  10/26/17 (!) 148/80  10/12/17 112/70  09/10/17 116/62   Wt Readings from Last 3 Encounters:  10/26/17 117 lb (53.1 kg)  10/12/17 119 lb (54 kg)  09/10/17 117 lb (53.1 kg)   Body mass index is 20.73 kg/m.   Physical Exam    Constitutional: Appears well-developed and well-nourished. No distress.  HENT:  Head: Normocephalic and atraumatic.  Neck: Neck supple. No tracheal deviation present.  Minimal thyromegaly present.  No cervical lymphadenopathy Cardiovascular: Normal rate, regular rhythm and normal heart sounds.   No murmur heard. No carotid bruit .  Mild swelling left ankle, minimal swelling in left foot, mild tenderness left ankle; no right ankle or foot swelling Pulmonary/Chest: Effort normal and breath sounds normal. No respiratory distress. No has no wheezes. No rales.  Skin: Skin is warm and dry. Not diaphoretic.  Psychiatric: Normal mood and affect. Behavior is normal.      Assessment & Plan:    See Problem List for Assessment and Plan of chronic medical problems.

## 2017-10-26 ENCOUNTER — Ambulatory Visit (INDEPENDENT_AMBULATORY_CARE_PROVIDER_SITE_OTHER): Payer: Medicare Other | Admitting: Internal Medicine

## 2017-10-26 ENCOUNTER — Telehealth: Payer: Self-pay | Admitting: Neurology

## 2017-10-26 ENCOUNTER — Encounter (HOSPITAL_COMMUNITY): Payer: Self-pay

## 2017-10-26 ENCOUNTER — Encounter: Payer: Self-pay | Admitting: Internal Medicine

## 2017-10-26 ENCOUNTER — Ambulatory Visit (HOSPITAL_COMMUNITY)
Admission: RE | Admit: 2017-10-26 | Discharge: 2017-10-26 | Disposition: A | Discharge status: Home / Self Care | Payer: Medicare Other | Source: Ambulatory Visit | Attending: Internal Medicine | Admitting: Internal Medicine

## 2017-10-26 ENCOUNTER — Other Ambulatory Visit (INDEPENDENT_AMBULATORY_CARE_PROVIDER_SITE_OTHER): Payer: Medicare Other

## 2017-10-26 VITALS — BP 148/80 | HR 74 | Temp 97.9°F | Resp 16 | Wt 117.0 lb

## 2017-10-26 DIAGNOSIS — R5382 Chronic fatigue, unspecified: Secondary | ICD-10-CM | POA: Diagnosis not present

## 2017-10-26 DIAGNOSIS — M25472 Effusion, left ankle: Secondary | ICD-10-CM

## 2017-10-26 DIAGNOSIS — R1310 Dysphagia, unspecified: Secondary | ICD-10-CM | POA: Diagnosis not present

## 2017-10-26 DIAGNOSIS — M7989 Other specified soft tissue disorders: Secondary | ICD-10-CM

## 2017-10-26 DIAGNOSIS — E041 Nontoxic single thyroid nodule: Secondary | ICD-10-CM

## 2017-10-26 DIAGNOSIS — I10 Essential (primary) hypertension: Secondary | ICD-10-CM

## 2017-10-26 LAB — CBC WITH DIFFERENTIAL/PLATELET
Basophils Absolute: 0 10*3/uL (ref 0.0–0.1)
Basophils Relative: 0.6 % (ref 0.0–3.0)
EOS ABS: 0 10*3/uL (ref 0.0–0.7)
Eosinophils Relative: 0.6 % (ref 0.0–5.0)
HEMATOCRIT: 36.4 % (ref 36.0–46.0)
Hemoglobin: 12.5 g/dL (ref 12.0–15.0)
Lymphocytes Relative: 17.8 % (ref 12.0–46.0)
Lymphs Abs: 0.9 10*3/uL (ref 0.7–4.0)
MCHC: 34.2 g/dL (ref 30.0–36.0)
MCV: 102.6 fl — ABNORMAL HIGH (ref 78.0–100.0)
MONO ABS: 0.6 10*3/uL (ref 0.1–1.0)
Monocytes Relative: 12.3 % — ABNORMAL HIGH (ref 3.0–12.0)
NEUTROS ABS: 3.6 10*3/uL (ref 1.4–7.7)
Neutrophils Relative %: 68.7 % (ref 43.0–77.0)
PLATELETS: 320 10*3/uL (ref 150.0–400.0)
RBC: 3.55 Mil/uL — ABNORMAL LOW (ref 3.87–5.11)
RDW: 14.9 % (ref 11.5–15.5)
WBC: 5.2 10*3/uL (ref 4.0–10.5)

## 2017-10-26 LAB — T3, FREE: T3, Free: 6.3 pg/mL — ABNORMAL HIGH (ref 2.3–4.2)

## 2017-10-26 LAB — COMPREHENSIVE METABOLIC PANEL
ALBUMIN: 4.3 g/dL (ref 3.5–5.2)
ALT: 20 U/L (ref 0–35)
AST: 29 U/L (ref 0–37)
Alkaline Phosphatase: 47 U/L (ref 39–117)
BILIRUBIN TOTAL: 0.4 mg/dL (ref 0.2–1.2)
BUN: 9 mg/dL (ref 6–23)
CO2: 33 mEq/L — ABNORMAL HIGH (ref 19–32)
CREATININE: 0.83 mg/dL (ref 0.40–1.20)
Calcium: 9.9 mg/dL (ref 8.4–10.5)
Chloride: 99 mEq/L (ref 96–112)
GFR: 71.1 mL/min (ref >60.00)
Glucose, Bld: 95 mg/dL (ref 70–99)
Potassium: 4.2 mEq/L (ref 3.5–5.1)
Sodium: 139 mEq/L (ref 135–145)
Total Protein: 6.7 g/dL (ref 6.0–8.3)

## 2017-10-26 LAB — TSH: TSH: 1.11 u[IU]/mL (ref 0.35–4.50)

## 2017-10-26 LAB — T4, FREE: Free T4: 1.42 ng/dL (ref 0.60–1.60)

## 2017-10-26 MED ORDER — OXYCODONE HCL 10 MG PO TABS: 10.0000 mg | ORAL_TABLET | Freq: Three times a day (TID) | ORAL | 0 refills | Status: DC | PRN

## 2017-10-26 NOTE — Progress Notes (Unsigned)
Today's left lower extremity venous duplex is negative for DVT. Preliminary results given to Dr. Quay Burow at 3:28.

## 2017-10-26 NOTE — Assessment & Plan Note (Signed)
As above No obvious injury Possible flare of rheumatoid arthritis Will rule out blood clot with ultrasound of lower extremity

## 2017-10-26 NOTE — Assessment & Plan Note (Addendum)
Increased compared to chronic fatigue Check CBC, CMP Check TFTs to rule out thyroid cause Possibly related to carvedilol-we will consider changing to a different medication

## 2017-10-26 NOTE — Patient Instructions (Signed)
  Test(s) ordered today. Your results will be released to [facility] (or called to you) after review, usually within 72hours after test completion. If any changes need to be made, you will be notified at that same time.  An ultrasound of your leg was ordered.   Medications reviewed and updated.  No changes recommended at this time.    A referral was ordered for radiology and ENT.

## 2017-10-26 NOTE — Telephone Encounter (Signed)
Rx. awaiting RAS sig/fim 

## 2017-10-26 NOTE — Assessment & Plan Note (Signed)
She is experiencing difficulty swallowing Just recently saw ENT-Dr. Newman-vocal cords evaluated No GERD She is concerned it may be related to thyroid Referred to ENT that could evaluate her thyroid-Dr. Lucia Gaskins is not a thyroid specialist

## 2017-10-26 NOTE — Telephone Encounter (Signed)
Rx. up front [facility] 

## 2017-10-26 NOTE — Assessment & Plan Note (Signed)
Appears to be possible inflammation of the left ankle-no injury.?  Related to rheumatoid arthritis Given that is only one leg and there is no obvious injury and no other symptoms suggestive of an RA flare need to rule out a blood clot Lower extremity ultrasound today

## 2017-10-26 NOTE — Telephone Encounter (Signed)
Pt request refill for Oxycodone HCl 10 MG TABS

## 2017-10-26 NOTE — Assessment & Plan Note (Addendum)
Elevated blood pressure could be related to her thyroid or could be related to the carvedilol not being enough for her blood pressure Bystolic is too expensive She does not ideally want to take amlodipine due to possibility of leg swelling She has had hyperkalemia so we are avoiding ACE inhibitors and ARB's She is already on Lasix Discussed that we can try metoprolol She will continue to monitor at home 2-3 times a day and will update me and we will make a decision about what change we will make CMP

## 2017-10-26 NOTE — Assessment & Plan Note (Addendum)
Concern for possible malignancy Referred urgently to interventional radiology for possible biopsy Referred to ENT for deals with thyroid-she would consider thyroidectomy if it is responsible for her symptoms and depending on biopsy results Recheck TFTs given other symptoms

## 2017-10-28 ENCOUNTER — Other Ambulatory Visit: Payer: Self-pay | Admitting: Internal Medicine

## 2017-10-28 ENCOUNTER — Encounter: Payer: Self-pay | Admitting: Internal Medicine

## 2017-10-28 DIAGNOSIS — E041 Nontoxic single thyroid nodule: Secondary | ICD-10-CM

## 2017-10-28 DIAGNOSIS — R1312 Dysphagia, oropharyngeal phase: Secondary | ICD-10-CM | POA: Diagnosis not present

## 2017-10-28 DIAGNOSIS — R490 Dysphonia: Secondary | ICD-10-CM | POA: Diagnosis not present

## 2017-10-28 DIAGNOSIS — K219 Gastro-esophageal reflux disease without esophagitis: Secondary | ICD-10-CM | POA: Diagnosis not present

## 2017-10-29 ENCOUNTER — Ambulatory Visit
Admission: RE | Admit: 2017-10-29 | Discharge: 2017-10-29 | Disposition: A | Discharge status: Home / Self Care | Payer: Medicare Other | Source: Ambulatory Visit | Attending: Internal Medicine | Admitting: Internal Medicine

## 2017-10-29 ENCOUNTER — Other Ambulatory Visit (HOSPITAL_COMMUNITY)
Admission: RE | Admit: 2017-10-29 | Discharge: 2017-10-29 | Disposition: A | Discharge status: Home / Self Care | Payer: Medicare Other | Source: Ambulatory Visit | Attending: Radiology | Admitting: Radiology

## 2017-10-29 DIAGNOSIS — E041 Nontoxic single thyroid nodule: Secondary | ICD-10-CM | POA: Diagnosis not present

## 2017-11-02 ENCOUNTER — Encounter: Payer: Self-pay | Admitting: Internal Medicine

## 2017-11-04 ENCOUNTER — Ambulatory Visit (INDEPENDENT_AMBULATORY_CARE_PROVIDER_SITE_OTHER): Payer: Medicare Other | Admitting: Physical Medicine and Rehabilitation

## 2017-11-04 DIAGNOSIS — R202 Paresthesia of skin: Secondary | ICD-10-CM | POA: Diagnosis not present

## 2017-11-05 ENCOUNTER — Encounter: Payer: Self-pay | Admitting: Rheumatology

## 2017-11-05 ENCOUNTER — Ambulatory Visit: Payer: Medicare Other | Admitting: Rheumatology

## 2017-11-05 VITALS — BP 115/67 | HR 75 | Resp 18 | Ht 63.0 in | Wt 117.0 lb

## 2017-11-05 DIAGNOSIS — R5383 Other fatigue: Secondary | ICD-10-CM

## 2017-11-05 DIAGNOSIS — Z8669 Personal history of other diseases of the nervous system and sense organs: Secondary | ICD-10-CM | POA: Diagnosis not present

## 2017-11-05 DIAGNOSIS — M0579 Rheumatoid arthritis with rheumatoid factor of multiple sites without organ or systems involvement: Secondary | ICD-10-CM

## 2017-11-05 DIAGNOSIS — G35D Multiple sclerosis, unspecified: Secondary | ICD-10-CM

## 2017-11-05 DIAGNOSIS — M7061 Trochanteric bursitis, right hip: Secondary | ICD-10-CM

## 2017-11-05 DIAGNOSIS — Z79899 Other long term (current) drug therapy: Secondary | ICD-10-CM | POA: Diagnosis not present

## 2017-11-05 DIAGNOSIS — Z96642 Presence of left artificial hip joint: Secondary | ICD-10-CM | POA: Diagnosis not present

## 2017-11-05 DIAGNOSIS — Z8679 Personal history of other diseases of the circulatory system: Secondary | ICD-10-CM

## 2017-11-05 DIAGNOSIS — M542 Cervicalgia: Secondary | ICD-10-CM

## 2017-11-05 DIAGNOSIS — Z8659 Personal history of other mental and behavioral disorders: Secondary | ICD-10-CM

## 2017-11-05 DIAGNOSIS — M1611 Unilateral primary osteoarthritis, right hip: Secondary | ICD-10-CM | POA: Diagnosis not present

## 2017-11-05 DIAGNOSIS — F5101 Primary insomnia: Secondary | ICD-10-CM

## 2017-11-05 DIAGNOSIS — G35 Multiple sclerosis: Secondary | ICD-10-CM

## 2017-11-05 DIAGNOSIS — M25572 Pain in left ankle and joints of left foot: Secondary | ICD-10-CM | POA: Diagnosis not present

## 2017-11-05 DIAGNOSIS — M5030 Other cervical disc degeneration, unspecified cervical region: Secondary | ICD-10-CM

## 2017-11-05 DIAGNOSIS — M810 Age-related osteoporosis without current pathological fracture: Secondary | ICD-10-CM

## 2017-11-05 DIAGNOSIS — Z8639 Personal history of other endocrine, nutritional and metabolic disease: Secondary | ICD-10-CM

## 2017-11-05 DIAGNOSIS — M4312 Spondylolisthesis, cervical region: Secondary | ICD-10-CM | POA: Diagnosis not present

## 2017-11-05 MED ORDER — PREDNISONE 5 MG PO TABS: ORAL_TABLET | ORAL | 0 refills | Status: DC

## 2017-11-05 MED ORDER — METHOTREXATE 2.5 MG PO TABS: ORAL_TABLET | ORAL | 0 refills | Status: DC

## 2017-11-05 NOTE — Procedures (Signed)
EMG & NCV Findings: Needle evaluation of the left first dorsal interosseous and the left abductor pollicis brevis muscles showed increased insertional activity and slightly increased spontaneous activity.  The left extensor digitorum communis muscle showed increased insertional activity and increased spontaneous activity.  The left triceps muscle showed increased insertional activity and diminished recruitment.  All remaining muscles (as indicated in the following table) showed no evidence of electrical instability.    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    There is no significant electrodiagnostic evidence of any focal peripheral nerve entrapment, brachial plexopathy or generalized peripheral neuropathy.   Recommendations: 1.  Follow-up with referring physician. 2.  Continue current management of symptoms. 3.  Suggest Cervical MRI and Neurosurgical evaluation.   Nerve Conduction Studies Anti Sensory Summary Table   Stim Site NR Peak (ms) Norm Peak (ms) P-T Amp (V) Norm P-T Amp Site1 Site2 Delta-P (ms) Dist (cm) Vel (m/s) Norm Vel (m/s)  Left Median Acr Palm Anti Sensory (2nd Digit)  32.8C  Wrist    3.4 <3.6 46.2 >10 Wrist Palm 1.5 0.0    Palm    1.9 <2.0 65.4         Left Radial Anti Sensory (Base 1st Digit)  31.8C  Wrist    2.3 <3.1 21.3  Wrist Base 1st Digit 2.3 0.0    Left Ulnar Anti Sensory (5th Digit)  32.9C  Wrist    3.7 <3.7 29.2 >15.0 Wrist 5th Digit 3.7 14.0 38 >38   Motor Summary Table   Stim Site NR Onset (ms) Norm Onset (ms) O-P Amp (mV) Norm O-P Amp Site1 Site2 Delta-0 (ms) Dist (cm) Vel (m/s) Norm Vel (m/s)  Left Median Motor (Abd Poll Brev)  31.5C  Wrist    3.2 <4.2 6.1 >5 Elbow Wrist 3.3 17.0 52 >50  Elbow    6.5  6.1         Left Ulnar Motor (Abd Dig Min)  31.3C  Wrist    3.7 <4.2 5.1 >3 B Elbow Wrist 2.6 16.0 62 >53  B Elbow    6.3  5.3  A Elbow B  Elbow 1.0 9.0 90 >53  A Elbow    7.3  5.3          EMG   Side Muscle Nerve Root Ins Act Fibs Psw Amp Dur Poly Recrt Int Fraser Din Comment  Left 1stDorInt Ulnar C8-T1 *Incr *1+ *1+ Nml Nml 0 Nml Nml   Left Abd Poll Brev Median C8-T1 *Incr *1+ *1+ Nml Nml 0 Nml Nml   Left ExtDigCom Post Int C7-8 *Incr *3+ *3+ Nml Nml 0 Nml Nml   Left Triceps Radial C6-7-8 *Incr Nml Nml Nml Nml 0 *Reduced Nml   Left Deltoid Axillary C5-6 Nml Nml Nml Nml Nml 0 Nml Nml   Left PronatorTeres Median C6-7 Nml Nml Nml Nml Nml 0 Nml Nml     Nerve Conduction Studies Anti Sensory Left/Right Comparison   Stim Site L Lat (ms) R Lat (ms) L-R Lat (ms) L Amp (V) R Amp (V) L-R Amp (%) Site1 Site2 L Vel (m/s) R Vel (m/s) L-R Vel (m/s)  Median Acr Palm Anti Sensory (2nd Digit)  32.8C  Wrist 3.4   46.2   Wrist Palm     Palm 1.9   65.4         Radial Anti Sensory (Base 1st Digit)  31.8C  Wrist 2.3  21.3   Wrist Base 1st Digit     Ulnar Anti Sensory (5th Digit)  32.9C  Wrist 3.7   29.2   Wrist 5th Digit 38     Motor Left/Right Comparison   Stim Site L Lat (ms) R Lat (ms) L-R Lat (ms) L Amp (mV) R Amp (mV) L-R Amp (%) Site1 Site2 L Vel (m/s) R Vel (m/s) L-R Vel (m/s)  Median Motor (Abd Poll Brev)  31.5C  Wrist 3.2   6.1   Elbow Wrist 52    Elbow 6.5   6.1         Ulnar Motor (Abd Dig Min)  31.3C  Wrist 3.7   5.1   B Elbow Wrist 62    B Elbow 6.3   5.3   A Elbow B Elbow 90    A Elbow 7.3   5.3            Waveforms:

## 2017-11-05 NOTE — Progress Notes (Signed)
Sherri [name] - 77 y.o. female MRN [id number]  Date of birth: 1940-07-07  Office Visit Note: Visit Date: 11/04/2017 PCP: Binnie Rail, MD Referred by: Binnie Rail, MD  Subjective: Chief Complaint  Patient presents with  . Left Arm - Pain    With numbness/tingling in the left hand   HPI: Sherri [name] is a right-hand-dominant 77 year old female that comes in today at the request of Hazel Sams, PA-C for electrodiagnostic study of the left upper limb.  Patient has an extensive medical history not limited to rheumatoid arthritis of multiple sclerosis and multiple back and neck surgeries.  She reports approximately 3 months of worsening pain in the left arm with numbness and tingling in the left hand mainly in digits 3 through 5.  She reports initial onset of symptoms were in both arms but the right arm and hand has resolved to a degree.  The left hand continues to be painful.  She does report that it has been less severe since her gabapentin was increased to 1 in the morning and 1 at noon and 2 at bedtime.  This gabapentin is only 100 mg however.  She has had prior right carpal tunnel release.  This was performed by Dr. Joya Salm as well as cervical spine surgery which amounts to ACDF as well as foraminotomies and posterior fixation at C2-3.  Recent cervical spine x-rays reveal solid fusion from C3-C7.  There is listhesis of 3 on 4 and apparently 7 on T1.  The patient has not had recent electrodiagnostic study or cervical MRI.  Last cervical surgery was in 2014.   ROS Otherwise per HPI.  Assessment & Plan: Visit Diagnoses:  1. Paresthesia of skin     Plan: No additional findings.  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    There is no significant electrodiagnostic evidence of any focal peripheral nerve entrapment, brachial plexopathy or generalized peripheral neuropathy.    Recommendations: 1.  Follow-up with referring physician. 2.  Continue current management of symptoms. 3.  Suggest Cervical MRI and Neurosurgical evaluation, Dr. Joya Salm has since retired so she will need recommendation for surgeon and prefers the same office as Dr. Joya Salm.  Meds & Orders: No orders of the defined types were placed in this encounter.   Orders Placed This Encounter  Procedures  . NCV with EMG (electromyography)    Follow-up: Return for Dr. Cy Blamer as scheduled.   Procedures: No procedures performed  EMG & NCV Findings: Needle evaluation of the left first dorsal interosseous and the left abductor pollicis brevis muscles showed increased insertional activity and slightly increased spontaneous activity.  The left extensor digitorum communis muscle showed increased insertional activity and increased spontaneous activity.  The left triceps muscle showed increased insertional activity and diminished recruitment.  All remaining muscles (as indicated in the following table) showed no evidence of electrical instability.    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    There is no significant electrodiagnostic evidence of any focal peripheral nerve entrapment, brachial plexopathy or generalized peripheral neuropathy.   Recommendations: 1.  Follow-up with referring physician. 2.  Continue current management of symptoms. 3.  Suggest Cervical MRI and Neurosurgical evaluation.   Nerve Conduction Studies Anti Sensory Summary Table   Stim Site NR Peak (ms) Norm Peak (ms) P-T Amp (V) Norm P-T  Amp Site1 Site2 Delta-P (ms) Dist (cm) Vel (m/s) Norm Vel (m/s)  Left Median Acr Palm Anti Sensory (2nd Digit)  32.8C  Wrist    3.4 <3.6 46.2 >10 Wrist Palm 1.5 0.0    Palm    1.9 <2.0 65.4         Left Radial Anti Sensory (Base 1st Digit)  31.8C  Wrist    2.3 <3.1 21.3  Wrist  Base 1st Digit 2.3 0.0    Left Ulnar Anti Sensory (5th Digit)  32.9C  Wrist    3.7 <3.7 29.2 >15.0 Wrist 5th Digit 3.7 14.0 38 >38   Motor Summary Table   Stim Site NR Onset (ms) Norm Onset (ms) O-P Amp (mV) Norm O-P Amp Site1 Site2 Delta-0 (ms) Dist (cm) Vel (m/s) Norm Vel (m/s)  Left Median Motor (Abd Poll Brev)  31.5C  Wrist    3.2 <4.2 6.1 >5 Elbow Wrist 3.3 17.0 52 >50  Elbow    6.5  6.1         Left Ulnar Motor (Abd Dig Min)  31.3C  Wrist    3.7 <4.2 5.1 >3 B Elbow Wrist 2.6 16.0 62 >53  B Elbow    6.3  5.3  A Elbow B Elbow 1.0 9.0 90 >53  A Elbow    7.3  5.3          EMG   Side Muscle Nerve Root Ins Act Fibs Psw Amp Dur Poly Recrt Int Fraser Din Comment  Left 1stDorInt Ulnar C8-T1 *Incr *1+ *1+ Nml Nml 0 Nml Nml   Left Abd Poll Brev Median C8-T1 *Incr *1+ *1+ Nml Nml 0 Nml Nml   Left ExtDigCom Post Int C7-8 *Incr *3+ *3+ Nml Nml 0 Nml Nml   Left Triceps Radial C6-7-8 *Incr Nml Nml Nml Nml 0 *Reduced Nml   Left Deltoid Axillary C5-6 Nml Nml Nml Nml Nml 0 Nml Nml   Left PronatorTeres Median C6-7 Nml Nml Nml Nml Nml 0 Nml Nml     Nerve Conduction Studies Anti Sensory Left/Right Comparison   Stim Site L Lat (ms) R Lat (ms) L-R Lat (ms) L Amp (V) R Amp (V) L-R Amp (%) Site1 Site2 L Vel (m/s) R Vel (m/s) L-R Vel (m/s)  Median Acr Palm Anti Sensory (2nd Digit)  32.8C  Wrist 3.4   46.2   Wrist Palm     Palm 1.9   65.4         Radial Anti Sensory (Base 1st Digit)  31.8C  Wrist 2.3   21.3   Wrist Base 1st Digit     Ulnar Anti Sensory (5th Digit)  32.9C  Wrist 3.7   29.2   Wrist 5th Digit 38     Motor Left/Right Comparison   Stim Site L Lat (ms) R Lat (ms) L-R Lat (ms) L Amp (mV) R Amp (mV) L-R Amp (%) Site1 Site2 L Vel (m/s) R Vel (m/s) L-R Vel (m/s)  Median Motor (Abd Poll Brev)  31.5C  Wrist 3.2   6.1   Elbow Wrist 52    Elbow 6.5   6.1         Ulnar Motor (Abd Dig Min)  31.3C  Wrist 3.7   5.1   B Elbow Wrist 62    B Elbow 6.3   5.3   A Elbow B Elbow 90    A Elbow 7.3    5.3            Waveforms:  Clinical History: No specialty comments available.   She reports that she quit smoking about 36 years ago. She has never used smokeless tobacco. No results for input(s): HGBA1C, LABURIC in the last 8760 hours.  Objective:  VS:  HT:    WT:   BMI:     BP:   HR: bpm  TEMP: ( )  RESP:  Physical Exam  Musculoskeletal:  Inspection reveals arthritic changes in both hands with some flattening of the bilateral APB and actually more atrophy of the right FDI compared to left.  There is no intrinsic hand musculature atrophy.  There is no swelling, color changes, allodynia or dystrophic changes. There is 5 out of 5 strength with bilateral long finger flexion but there is some mild weakness left compared to right with wrist extension and finger abduction.. There is intact sensation to light touch in all dermatomal and peripheral nerve distributions. There is a negative Froment's test bilaterally. There is a equivocally positive Tinel's test at the left elbow. There is a negative Phalen's test bilaterally. There is a negative Hoffmann's test bilaterally.    Ortho Exam Imaging: No results found.  Past Medical/Family/Surgical/Social History: Medications & Allergies reviewed per EMR, new medications updated. Patient Active Problem List   Diagnosis Date Noted  . Swelling of left foot 10/26/2017  . Dysphagia 10/26/2017  . Left ankle swelling 10/26/2017  . Localized swelling, mass, and lump of head 08/18/2017  . Gastroesophageal reflux disease 08/06/2017  . Easy bruising 08/06/2017  . Anxiety 04/24/2017  . Mid back pain 03/26/2017  . Left sided chest pain 03/25/2017  . Dyspnea on exertion 03/14/2017  . Hyperkalemia 03/13/2017  . Rash and nonspecific skin eruption 01/08/2017  . Submandibular gland swelling 01/08/2017  . High risk medication use 09/02/2016  . Urinary frequency 09/02/2016  . Piriformis muscle pain 09/02/2016  . Fatigue 07/22/2016  .  Hair loss 07/22/2016  . Right hip pain 04/08/2016  . Right knee pain 02/25/2016  . Dyspepsia 04/05/2015  . Numbness 11/14/2014  . Biceps tendon tear 10/10/2014  . Insomnia 10/10/2014  . Gait disturbance 06/13/2014  . 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 OP (osteoporosis) 01/18/2014  . Other long term (current) drug therapy 01/18/2014  . Other dysphagia 12/29/2013  . Hoarseness 12/29/2013  . 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 Personal history of colonic polyps 07/03/2010  . Nocturia 05/28/2010  . THYROID NODULE 05/17/2008  . Multiple sclerosis ([place]) 05/17/2008  . Chronic fatigue 03/30/2007  . Hyperlipidemia 08/20/2006  . Essential hypertension 08/20/2006  . Rheumatoid arthritis ([place]) 08/20/2006  . CERVICAL CANCER, HX OF 08/20/2006  . GASTRIC ULCER, HX OF 08/20/2006   Past Medical History:  Diagnosis Date  . Anxiety    takes Valium daily as needed  . Bruises easily    d/t meds  . Cancer ([place])    basal cell ca, in situ- uterine   . Cataracts, bilateral   . Chronic back pain   . Dizziness    r/t to meds  . GERD (gastroesophageal reflux disease)    no meds on a regular basis but will take Tums if needed  . Headache(784.0)    r/t neck issues  . History of bronchitis 6-75yrs ago  . History of colon polyps    benign  . Hyperlipidemia    takes Atorvastatin on Mondays and Fridays  . Hypertension    takes Ramipril daily  . Joint pain   . Joint swelling   . Multiple sclerosis ([place])   .  Neuromuscular disorder ([place])    Dr. Park Liter- [facility], follows [facility]  . Nocturia   . OA (osteoarthritis)    Dr Jani Gravel weekly, RA- hands- knees- feet   . PONV (postoperative nausea and vomiting)    trouble urinating after surgery in 2014  . Rheumatoid arthritis(714.0)    Dr Estanislado Pandy takes [name] daily  . Skin cancer   . Urinary retention    sees Dr.Wrenn about 2 times a yr   Family History  Problem Relation Age of Onset  . Cancer Mother          pancreatic  . Diabetes Mother   . Heart disease Father        Rheumatic  . Cancer Father        ? stomach  . Cancer Sister        stomach  . Cancer Maternal Aunt        X 4; ? primary  . Heart disease Paternal Aunt   . Cancer Maternal Grandmother        cervical  . Colon cancer Unknown        Aunts  . Multiple sclerosis Daughter   . Hypertension Neg Hx   . Stroke Neg Hx    Past Surgical History:  Procedure Laterality Date  . ABDOMINAL HYSTERECTOMY     1985  . APPENDECTOMY     with TAH  . BACK SURGERY     several  . BROW LIFT Bilateral 10/02/2016   Procedure: BILATERAL LOWER LID BLEPHAROPLASTY;  Surgeon: Clista Bernhardt, MD;  Location: [facility];  Service: Plastics;  Laterality: Bilateral;  . CARPAL TUNNEL RELEASE Right   . cataracts    . CERVICAL FUSION     Dr Joya Salm  . CERVICAL FUSION  12/31/2012   Dr Joya Salm  . CHEST TUBE INSERTION     for traumatic Pneumothorax  . COLONOSCOPY  07/16/2010   normal   . COLONOSCOPY W/ POLYPECTOMY  1997   negative since; Dr Deatra Ina  . ESOPHAGOGASTRODUODENOSCOPY  07/16/2010   normal  . eye lid raise    . EYE SURGERY Bilateral    cataracts removed - /w IOL  . LUMBAR FUSION     Dr Joya Salm  . NASAL SINUS SURGERY    . POSTERIOR CERVICAL FUSION/FORAMINOTOMY N/A 12/31/2012   Procedure: POSTERIOR LATERAL CERVICAL FUSION/FORAMINOTOMY LEVEL 1 CERVICAL THREE-FOUR WITH LATERAL MASS SCREWS;  Surgeon: Floyce Stakes, MD;  Location: [facility];  Service: Neurosurgery;  Laterality: N/A;  . PTOSIS REPAIR Bilateral 10/02/2016   Procedure: INTERNAL PTOSIS REPAIR;  Surgeon: Clista Bernhardt, MD;  Location: [facility];  Service: Plastics;  Laterality: Bilateral;  . SKIN BIOPSY    . THYROID SURGERY     R lobe removed- 1972, has grown back - CT last done- 2018  . TOTAL HIP ARTHROPLASTY  12/22/2011   Procedure: TOTAL HIP ARTHROPLASTY;  Surgeon: Kerin Salen, MD;  Location: [facility];  Service: Orthopedics;  Laterality: Left;  . TUBAL LIGATION     Social  History   Occupational History  . Occupation: retired  Tobacco Use  . Smoking status: Former Smoker    Last attempt to quit: 05/26/1981    Years since quitting: 36.4  . Smokeless tobacco: Never Used  . Tobacco comment: Smoked [phone], up to 2.5 ppd  Substance and Sexual Activity  . Alcohol use: Yes    Alcohol/week: 0.0 oz    Comment: 2% beer  . Drug use: Never  . Sexual  activity: Yes

## 2017-11-05 NOTE — Patient Instructions (Signed)
Standing Labs We placed an order today for your standing lab work.    Please come back and get your standing labs in 2 weeks after increasing the dose of methotrexate and then every 2 months  We have open lab Monday through Friday from 8:30-11:30 AM and 1:30-4:00 PM  at the office of Dr. Bo Merino.   You may experience shorter wait times on Monday and Friday afternoons. The office is located at [address] [place], [place], [place] [address] No appointment is necessary.   Labs are drawn by [facility].  You may receive a bill from [facility] for your lab work. If you have any questions regarding directions or hours of operation,  please call [phone].

## 2017-11-09 ENCOUNTER — Telehealth: Payer: Self-pay | Admitting: Rheumatology

## 2017-11-09 NOTE — Telephone Encounter (Signed)
Patient left a voicemail stating she was returning your call.   

## 2017-11-09 NOTE — Telephone Encounter (Signed)
Attempted to contact the patient and left message for patient to call the office.  

## 2017-11-10 ENCOUNTER — Other Ambulatory Visit: Payer: Self-pay

## 2017-11-10 ENCOUNTER — Other Ambulatory Visit: Payer: Self-pay | Admitting: Internal Medicine

## 2017-11-10 ENCOUNTER — Encounter: Payer: Self-pay | Admitting: Neurology

## 2017-11-10 ENCOUNTER — Telehealth: Payer: Self-pay | Admitting: Rheumatology

## 2017-11-10 ENCOUNTER — Ambulatory Visit: Payer: Medicare Other | Admitting: Neurology

## 2017-11-10 VITALS — BP 140/71 | HR 71 | Resp 18 | Ht 63.0 in | Wt 115.5 lb

## 2017-11-10 DIAGNOSIS — R5382 Chronic fatigue, unspecified: Secondary | ICD-10-CM

## 2017-11-10 DIAGNOSIS — Z981 Arthrodesis status: Secondary | ICD-10-CM

## 2017-11-10 DIAGNOSIS — R350 Frequency of micturition: Secondary | ICD-10-CM | POA: Diagnosis not present

## 2017-11-10 DIAGNOSIS — Z9889 Other specified postprocedural states: Secondary | ICD-10-CM | POA: Insufficient documentation

## 2017-11-10 DIAGNOSIS — M0579 Rheumatoid arthritis with rheumatoid factor of multiple sites without organ or systems involvement: Secondary | ICD-10-CM

## 2017-11-10 DIAGNOSIS — R269 Unspecified abnormalities of gait and mobility: Secondary | ICD-10-CM | POA: Diagnosis not present

## 2017-11-10 DIAGNOSIS — G35 Multiple sclerosis: Secondary | ICD-10-CM

## 2017-11-10 MED ORDER — GABAPENTIN 300 MG PO CAPS: ORAL_CAPSULE | ORAL | 3 refills | Status: DC

## 2017-11-10 NOTE — Progress Notes (Signed)
[facility]  PATIENT: Sherri [name] DOB: 10-15-1940  REFERRING CLINICIAN: Unice Cobble  HISTORY FROM: patient REASON FOR VISIT: Multiple sclerosis   HISTORICAL  CHIEF COMPLAINT:  Chief Complaint  Patient presents with  . Multiple Sclerosis    Not on a dmt for MS,, but continues Xeljanz, Methotrexate and Prednisone for RA.  Sts. RA is worse in hands, feet, wrists, hands.  Would like to discuss other tx. options.  Also c/o left arm numbness and pain left scapula. Sts. has had an EMG/NCV which determined sx. are coming from her neck.  Has an MRI c-spine sched. for Thursday/fim    HISTORY OF PRESENT ILLNESS:  Sherri [name] is a 77 year old woman who was diagnosed with relapsing remitting MS in 2010.     Update 11/10/2017: She feels her MS has been stable.  She is off of any MS DMT but is on Xeljanz and Methotrexate for RA.    She is on prednisone 5 mg (10 mg for flares) daily.   Pain has been much worse on attempts to go off prednisone.    Her MS is mild so I'm fine with her off a DMT.     Aubagio caused a lot of hair loss.    We discussed avoiding Enbrel/Remicade/Humira/Simponi  She was found to have C7, C8 and T1 radiculopathy on recent NCV/EMG.   She has fusion from C3 to C7.   She has a cervical spine MRI later this week.    Cervical spine 3 or 4 years ago showed fusion from C3-C7.  There is possible pseudoarthrosis at C6-C7 and minimal spondylolisthesis at C7-T1.  There is  She has had some dysphagia but it is felt possibly due to an enlarged thyroid (felt due to cysts on Bx)     Update 06/09/2017: She feels the MS is stable.   No new weakness or numbness and gait is unchanged. She remains on methotrexate and Xeljanz for her rheumatoid arthritis. She is off of her MS disease modifying therapy. She did not tolerate leflunomide due to hair loss.  She reports more hip pain that radiates into her leg all the way to the side of the foot..   A recent hip injection  by Dr. Estanislado Pandy did not help.   Of note she has had L4L5 and L5S1 fusion by Dr. Joya Salm in the past.     Her gabapentin dose is currently 100 in am and 100 mg at night.   He had trouble tolerating 200 mg in the morning and 300 mg at night.   Gabapentin did help her RLS and she tolerates it well.   It does not help her pain..      From 10/30/2016: MS:   She feels her MS is stable.    She also has RA and is on Xeljanz and prednisone.    She is not on any DMT for her MS.     She denies any recent exacerbation.     Her right leg sometimes gives out on her but the changes have been gradual.     Vision:    She had recent surgery for ptosis (10/02/16) after she had cataract surgery (still had reduced peripheral vision after initil surgery when she noted the problem more).      RA:   She remains on Xeljanz and methotrexate.   MTX dose reduced as higher does may have affected renal function and dose was reduced.     Gait/strength:  She feels gait is worse and she notes dragging her right foot more, even for short distances.    He also notes more cramps in the right leg.   The right leg also sometimes gives out randomly.  Gait also affected by right hip pain and she also had left THR in the past.      She feels gait is not bad enough to use a cane but does feel off balanced.    The legs (R>L) also ache a lot more.  Tramadol during the day and oxycodone and valium at night help the pain a lot.   Leg cramps are worse when she lays down.    Sensation:   She notes mild foot numbness bilaterally which is stable.     She has stable left facial numbness  in V2 and V3 > V1.   This was her first symptom (tongue was numb many years before diagnosis).    No arm numbness.     Bladder:   She has urinary frequency and hesitancy.   She does not completely empty (has PVR's at urology).    She has no recent UTI's,       Fatigue/sleep:     Fatigue is mild most days but worse a day after 'overdoing it'.    She is sleeping  well with Valium 5 mg and Oxycodone 10 mg at bedtime .   He sleeps better with her pain under better control.    She is active, walking daily.    Mood/cognition:     Mood is doing well.    She does not note that she has any significant cognitive changes.       MS History:   When she was in her late 67s she had an episode of right-sided numbness and she lost the use of her right arm for a couple of weeks. At that time, she was told that she might have MS. Over the next 30 or 40 years she had several more similar but milder episodes. In 2010, she had an MRI of the cervical spine (for arm pain) and it showed foci c/w multiple sclerosis. An MRI of the brain was also performed  shortly thereafter showing white matter lesions consistent with the diagnosis.  Initially, she was placed on Avonex. She also has rheumatoid arthritis and was placed on methotrexate and then a combination of methotrexate with [name]. We decided to stop the Avonex as she probably would get some benefit from those medications for the MS as well. For the most part her MS has been stable over the past 5 years.However, early 2016,  she had numbness in the left face and weakness in her legs   REVIEW OF SYSTEMS:  Constitutional: No fevers, chills, sweats, or change in appetite Eyes: No visual changes, double vision, eye pain Ear, nose and throat: No hearing loss, ear pain, nasal congestion, sore throat Cardiovascular: No chest pain, palpitations Respiratory:  No shortness of breath at rest or with exertion.   No wheezes GastrointestinaI: No nausea, vomiting, diarrhea, abdominal pain, fecal incontinence Genitourinary:  as above. Musculoskeletal:  No neck pain, back pain Integumentary: No rash, pruritus, skin lesions Neurological: as above Psychiatric: he has had a lot of family stress leading to some depression and more anxiety. Endocrine: No palpitations, diaphoresis, change in appetite, change in weigh or increased  thirst Hematologic/Lymphatic:  No anemia, purpura, petechiae. Allergic/Immunologic: No itchy/runny eyes, nasal congestion, recent allergic reactions, rashes  ALLERGIES: Allergies  Allergen Reactions  .  Darvon [Propoxyphene Hcl] Shortness Of Breath    ? Dose Related ? Lowered respirations greatly  . Meperidine Hcl Shortness Of Breath  . Penicillins Hives and Other (See Comments)    Has patient had a PCN reaction causing immediate rash, facial/tongue/throat swelling, SOB or lightheadedness with hypotension: No SEVERE RASH INVOLVING MUCUS MEMBRANES or SKIN NECROSIS: #  #  #  YES  #  #  #  Has patient had a PCN reaction that required hospitalization No Has patient had a PCN reaction occurring within the last 10 years: No.   . Azathioprine Other (See Comments)    Weakness  . Ezetimibe-Simvastatin Nausea And Vomiting    HOME MEDICATIONS:   PAST MEDICAL HISTORY: Past Medical History:  Diagnosis Date  . Anxiety    takes Valium daily as needed  . Bruises easily    d/t meds  . Cancer ([place])    basal cell ca, in situ- uterine   . Cataracts, bilateral   . Chronic back pain   . Dizziness    r/t to meds  . GERD (gastroesophageal reflux disease)    no meds on a regular basis but will take Tums if needed  . Headache(784.0)    r/t neck issues  . History of bronchitis 6-65yrs ago  . History of colon polyps    benign  . Hyperlipidemia    takes Atorvastatin on Mondays and Fridays  . Hypertension    takes Ramipril daily  . Joint pain   . Joint swelling   . Multiple sclerosis ([place])   . Neuromuscular disorder ([place])    Dr. Park Liter- [facility], follows M.S.  . Nocturia   . OA (osteoarthritis)    Dr Jani Gravel weekly, RA- hands- knees- feet   . PONV (postoperative nausea and vomiting)    trouble urinating after surgery in 2014  . Rheumatoid arthritis(714.0)    Dr Estanislado Pandy takes [name] daily  . Skin cancer   . Urinary retention    sees Dr.Wrenn about 2 times a yr     PAST SURGICAL HISTORY: Past Surgical History:  Procedure Laterality Date  . ABDOMINAL HYSTERECTOMY     1985  . APPENDECTOMY     with TAH  . BACK SURGERY     several  . BROW LIFT Bilateral 10/02/2016   Procedure: BILATERAL LOWER LID BLEPHAROPLASTY;  Surgeon: Clista Bernhardt, MD;  Location: [place];  Service: Plastics;  Laterality: Bilateral;  . CARPAL TUNNEL RELEASE Right   . cataracts    . CERVICAL FUSION     Dr Joya Salm  . CERVICAL FUSION  12/31/2012   Dr Joya Salm  . CHEST TUBE INSERTION     for traumatic Pneumothorax  . COLONOSCOPY  07/16/2010   normal   . COLONOSCOPY W/ POLYPECTOMY  1997   negative since; Dr Deatra Ina  . ESOPHAGOGASTRODUODENOSCOPY  07/16/2010   normal  . eye lid raise    . EYE SURGERY Bilateral    cataracts removed - /w IOL  . JOINT REPLACEMENT    . LUMBAR FUSION     Dr Joya Salm  . NASAL SINUS SURGERY    . POSTERIOR CERVICAL FUSION/FORAMINOTOMY N/A 12/31/2012   Procedure: POSTERIOR LATERAL CERVICAL FUSION/FORAMINOTOMY LEVEL 1 CERVICAL THREE-FOUR WITH LATERAL MASS SCREWS;  Surgeon: Floyce Stakes, MD;  Location: [facility];  Service: Neurosurgery;  Laterality: N/A;  . PTOSIS REPAIR Bilateral 10/02/2016   Procedure: INTERNAL PTOSIS REPAIR;  Surgeon: Clista Bernhardt, MD;  Location: [place];  Service: Plastics;  Laterality: Bilateral;  . SKIN BIOPSY    . THYROID SURGERY     R lobe removed- 1972, has grown back - CT last done- 2018  . TOTAL HIP ARTHROPLASTY  12/22/2011   Procedure: TOTAL HIP ARTHROPLASTY;  Surgeon: Kerin Salen, MD;  Location: [place];  Service: Orthopedics;  Laterality: Left;  . TOTAL SHOULDER ARTHROPLASTY    . TUBAL LIGATION      FAMILY HISTORY: Family History  Problem Relation Age of Onset  . Cancer Mother        pancreatic  . Diabetes Mother   . Heart disease Father        Rheumatic  . Cancer Father        ? stomach  . Cancer Sister        stomach  . Cancer Maternal Aunt        X 4; ? primary  . Heart disease Paternal Aunt   .  Cancer Maternal Grandmother        cervical  . Colon cancer Unknown        Aunts  . Multiple sclerosis Daughter   . Hypertension Neg Hx   . Stroke Neg Hx     SOCIAL HISTORY:  Social History   Socioeconomic History  . Marital status: Married    Spouse name: Not on file  . Number of children: Not on file  . Years of education: Not on file  . Highest education level: Not on file  Occupational History  . Occupation: retired  Scientific laboratory technician  . Financial resource strain: Not on file  . Food insecurity:    Worry: Not on file    Inability: Not on file  . Transportation needs:    Medical: Not on file    Non-medical: Not on file  Tobacco Use  . Smoking status: Former Smoker    Packs/day: 2.50    Years: 20.00    Pack years: 50.00    Types: Cigarettes    Last attempt to quit: 05/26/1981    Years since quitting: 36.4  . Smokeless tobacco: Never Used  . Tobacco comment: Smoked [id number], up to 2.5 ppd  Substance and Sexual Activity  . Alcohol use: Yes    Alcohol/week: 10.8 oz    Types: 18 Cans of beer per week    Comment: 4% beer  . Drug use: Never  . Sexual activity: Yes  Lifestyle  . Physical activity:    Days per week: Not on file    Minutes per session: Not on file  . Stress: Not on file  Relationships  . Social connections:    Talks on phone: Not on file    Gets together: Not on file    Attends religious service: Not on file    Active member of club or organization: Not on file    Attends meetings of clubs or organizations: Not on file    Relationship status: Not on file  . Intimate partner violence:    Fear of current or ex partner: Not on file    Emotionally abused: Not on file    Physically abused: Not on file    Forced sexual activity: Not on file  Other Topics Concern  . Not on file  Social History Narrative  . Not on file     PHYSICAL EXAM  Vitals:   11/10/17 1451  BP: 140/71  Pulse: 71  Resp: 18  Weight: 115 lb 8 oz (52.4 kg)  Height: 5\' 3"   (  1.6 m)    Body mass index is 20.46 kg/m.   General: The patient is well-developed and well-nourished and in no acute distress  Musculoskeletal:   There is mild lower paraspinal tenderness.  Range of motion is reduced in the neck..   Skin: Extremities are without rash or edema.  Neurologic Exam  Mental status: The patient is alert and oriented x 3 at the time of the examination. The patient has apparent normal recent and remote memory, with an apparently normal attention span and concentration ability.   Speech is normal.  CN:  EOMI, there is reduced sensation in the left V2 distribution.  Facial strength is normal.  Trapezius strength is normal..  Palatal elevation is normal and Tongue protrusion midline.    No obvious hearing deficits are noted.  Motor:  Right biceps bulges (she has a history of rupture).   Tone is mildly increased in her legs, left > right. Strength appears 5 / 5 in all 4 extremities except 4/5 ulnar innervated intrinsic hand muscles on the right.     Sensory: Sensory testing is intact to touch and vibration in the arms and legs.  Coordination: Cerebellar testing reveals good rapid alternating movements in hands .  Mildly reduced heel-to-shin, right worse than left  Gait and station: Station is stable.  The gait is arthritic.  It is mildly wide.. She has a mild right foot drop. The tandem gait is wide..      Romberg is negative.   Reflexes: Deep tendon reflexes are normal and symmetric.    DIAGNOSTIC DATA (LABS, IMAGING, TESTING) - I reviewed patient records, labs, notes, testing and imaging myself where available.  Lab Results  Component Value Date   WBC 5.2 10/26/2017   HGB 12.5 10/26/2017   HCT 36.4 10/26/2017   MCV 102.6 (H) 10/26/2017   PLT 320.0 10/26/2017      Component Value Date/Time   NA 139 10/26/2017 1527   NA 141 07/01/2016 1516   NA 136 02/22/2015   K 4.2 10/26/2017 1527   K 4.4 02/22/2015   CL 99 10/26/2017 1527   CL 98 02/22/2015     CO2 33 (H) 10/26/2017 1527   CO2 30 02/22/2015   GLUCOSE 95 10/26/2017 1527   BUN 9 10/26/2017 1527   BUN 9 07/01/2016 1516   CREATININE 0.83 10/26/2017 1527   CREATININE 0.93 08/31/2017 1327   CALCIUM 9.9 10/26/2017 1527   CALCIUM 9.6 02/22/2015   PROT 6.7 10/26/2017 1527   PROT 6.7 07/01/2016 1516   PROT 6.6 02/22/2015   ALBUMIN 4.3 10/26/2017 1527   ALBUMIN 4.5 07/01/2016 1516   ALBUMIN 4.3 02/22/2015   AST 29 10/26/2017 1527   AST 24 02/22/2015   ALT 20 10/26/2017 1527   ALT 18 02/22/2015   ALKPHOS 47 10/26/2017 1527   ALKPHOS 34 02/22/2015   BILITOT 0.4 10/26/2017 1527   BILITOT 0.3 07/01/2016 1516   GFRNONAA 60 08/31/2017 1327   GFRAA 70 08/31/2017 1327   Lab Results  Component Value Date   CHOL 185 04/24/2017   HDL 72.00 04/24/2017   LDLCALC 98 04/24/2017   LDLDIRECT 122.9 11/15/2012   TRIG 75.0 04/24/2017   CHOLHDL 3 04/24/2017   Lab Results  Component Value Date   HGBA1C 5.6 10/24/2013   Lab Results  Component Value Date   [id number] 778 (H) 07/22/2016   Lab Results  Component Value Date   TSH 1.11 10/26/2017        ASSESSMENT AND PLAN  Multiple sclerosis (HCC)  Rheumatoid arthritis involving multiple sites with positive rheumatoid factor (HCC)  Chronic fatigue  S/P cervical spinal fusion  Urinary frequency  Gait disturbance   1.   Her MS has been mild for many years and she is stable off of a disease modifying therapy.  She will continue her medications for rheumatoid arthritis and we discussed avoiding the agents that active through TNF.    2.    Continue oxycodone at bedtime.   We discussed increasing the gabapentin dose slowly to 300 mg 3 times daily.   3.    return to see [place] in 4-5 months or sooner if she has new or worsening neurologic symptoms.    Zemira Zehring A. Felecia Shelling, MD, PhD 0/34/0352, 4:81 PM Certified in Neurology, Clinical Neurophysiology, Sleep Medicine, Pain Medicine and Neuroimaging  [facility] [address], [place], [place] [address] [phone]

## 2017-11-10 NOTE — Telephone Encounter (Signed)
Patient left a voicemail stating she was returning your call.   

## 2017-11-11 ENCOUNTER — Other Ambulatory Visit: Payer: Self-pay | Admitting: Otolaryngology

## 2017-11-11 ENCOUNTER — Telehealth: Payer: Self-pay | Admitting: Rheumatology

## 2017-11-11 DIAGNOSIS — E041 Nontoxic single thyroid nodule: Secondary | ICD-10-CM

## 2017-11-11 NOTE — Telephone Encounter (Signed)
Attempted to contact the patient and left message for patient to call the office.  

## 2017-11-11 NOTE — Telephone Encounter (Signed)
Patient called stating she was returning your call.   °

## 2017-11-12 ENCOUNTER — Telehealth: Payer: Self-pay | Admitting: Rheumatology

## 2017-11-12 NOTE — Telephone Encounter (Signed)
Contacted the patient and she is not sure who called her. We were not trying to reach the patient.

## 2017-11-12 NOTE — Telephone Encounter (Signed)
See previous phone note.  

## 2017-11-12 NOTE — Telephone Encounter (Signed)
Patient left a voicemail stating she was returning your call.   

## 2017-11-13 ENCOUNTER — Ambulatory Visit (HOSPITAL_COMMUNITY)
Admission: RE | Admit: 2017-11-13 | Discharge: 2017-11-13 | Disposition: A | Discharge status: Home / Self Care | Payer: Medicare Other | Source: Ambulatory Visit | Attending: Rheumatology | Admitting: Rheumatology

## 2017-11-13 DIAGNOSIS — M50122 Cervical disc disorder at C5-C6 level with radiculopathy: Secondary | ICD-10-CM | POA: Diagnosis not present

## 2017-11-13 DIAGNOSIS — M542 Cervicalgia: Secondary | ICD-10-CM | POA: Diagnosis not present

## 2017-11-15 NOTE — Progress Notes (Signed)
Please notify patient. She may have injection through her NES. We can fax results to her NES.

## 2017-11-17 ENCOUNTER — Telehealth: Payer: Self-pay | Admitting: Rheumatology

## 2017-11-17 DIAGNOSIS — M4312 Spondylolisthesis, cervical region: Secondary | ICD-10-CM

## 2017-11-17 NOTE — Telephone Encounter (Signed)
Patient advised of results and referral placed for neurosurgery as patient's previous neurosurgeon has retired.

## 2017-11-17 NOTE — Telephone Encounter (Signed)
Patient left a voicemail stating she had an MRI on Friday 11/13/17 and is requesting a return call to discuss the results.

## 2017-11-23 ENCOUNTER — Telehealth: Payer: Self-pay | Admitting: Neurology

## 2017-11-23 NOTE — Telephone Encounter (Signed)
Pt requesting a call stating that she will need a new medication to maintain her pain throughout the day. Stating that traMADol (ULTRAM) 50 MG tablet has started to make her stomach very upset, please call to advise

## 2017-11-23 NOTE — Telephone Encounter (Signed)
Spoke with Sherri [name].  She has been taking Tramadol for daytime pain for years; sts. it is suddenly causing lots of gas--belching and "it feels like bombs going off in my stomach."  Taking Pepcid, Zantac and Prilosec. Sts. she doesn't want another opioid, just something for daytime pain that will be easier on her stomach. She has a complicated hx.  She is agreeable with waiting for RAS to return to the office next wk, then will call her back/fim

## 2017-11-30 ENCOUNTER — Ambulatory Visit (INDEPENDENT_AMBULATORY_CARE_PROVIDER_SITE_OTHER): Payer: Medicare Other | Admitting: Orthopaedic Surgery

## 2017-11-30 ENCOUNTER — Other Ambulatory Visit: Payer: Self-pay

## 2017-11-30 ENCOUNTER — Ambulatory Visit (INDEPENDENT_AMBULATORY_CARE_PROVIDER_SITE_OTHER): Payer: Self-pay

## 2017-11-30 ENCOUNTER — Encounter (INDEPENDENT_AMBULATORY_CARE_PROVIDER_SITE_OTHER): Payer: Self-pay | Admitting: Orthopaedic Surgery

## 2017-11-30 VITALS — BP 103/65 | HR 68 | Ht 62.0 in | Wt 112.0 lb

## 2017-11-30 DIAGNOSIS — M25571 Pain in right ankle and joints of right foot: Secondary | ICD-10-CM

## 2017-11-30 DIAGNOSIS — Z79899 Other long term (current) drug therapy: Secondary | ICD-10-CM

## 2017-11-30 LAB — COMPLETE METABOLIC PANEL WITH GFR
AG RATIO: 1.8 (calc) (ref 1.0–2.5)
ALT: 13 U/L (ref 6–29)
AST: 23 U/L (ref 10–35)
Albumin: 3.9 g/dL (ref 3.6–5.1)
Alkaline phosphatase (APISO): 46 U/L (ref 33–130)
BILIRUBIN TOTAL: 0.6 mg/dL (ref 0.2–1.2)
BUN: 14 mg/dL (ref 7–25)
CALCIUM: 9 mg/dL (ref 8.6–10.4)
CHLORIDE: 97 mmol/L — AB (ref 98–110)
CO2: 30 mmol/L (ref 20–32)
Creat: 0.84 mg/dL (ref 0.60–0.93)
GFR, Est African American: 79 mL/min/{1.73_m2} (ref >=60)
GFR, Est Non African American: 68 mL/min/{1.73_m2} (ref >=60)
GLOBULIN: 2.2 g/dL (ref 1.9–3.7)
Glucose, Bld: 93 mg/dL (ref 65–99)
POTASSIUM: 4.4 mmol/L (ref 3.5–5.3)
SODIUM: 134 mmol/L — AB (ref 135–146)
TOTAL PROTEIN: 6.1 g/dL (ref 6.1–8.1)

## 2017-11-30 LAB — CBC WITH DIFFERENTIAL/PLATELET
Basophils Absolute: 27 cells/uL (ref 0–200)
Basophils Relative: 0.4 %
EOS ABS: 41 {cells}/uL (ref 15–500)
EOS PCT: 0.6 %
HCT: 35.2 % (ref 35.0–45.0)
HEMOGLOBIN: 12.2 g/dL (ref 11.7–15.5)
Lymphs Abs: 510 cells/uL — ABNORMAL LOW (ref 850–3900)
MCH: 34.4 pg — AB (ref 27.0–33.0)
MCHC: 34.7 g/dL (ref 32.0–36.0)
MCV: 99.2 fL (ref 80.0–100.0)
MONOS PCT: 8.6 %
MPV: 9.8 fL (ref 7.5–12.5)
NEUTROS PCT: 82.9 %
Neutro Abs: 5637 cells/uL (ref 1500–7800)
Platelets: 263 10*3/uL (ref 140–400)
RBC: 3.55 10*6/uL — ABNORMAL LOW (ref 3.80–5.10)
RDW: 13.4 % (ref 11.0–15.0)
TOTAL LYMPHOCYTE: 7.5 %
WBC mixed population: 585 cells/uL (ref 200–950)
WBC: 6.8 10*3/uL (ref 3.8–10.8)

## 2017-11-30 NOTE — Telephone Encounter (Signed)
We can't mix Nucynta with oxycodone.    We can add an anti-inflammatory - etodolac 400 mg po bid  #60  #5

## 2017-11-30 NOTE — Telephone Encounter (Signed)
Spoke with [name] and per RAS, explained he can't add Nucynta, but can give Etodolac.  She verbalized understanding of same, sts. she tried Tramadol again last night and it did not cause gi upset, so she'd like to continue it for now and will let me know if she changes her mind.  Sts. she is in the ortho office now--fell over the wkend and believes she fx. right foot (she has broken this foot before).  She will call back if needed or discuss further at next ov/fim

## 2017-11-30 NOTE — Progress Notes (Signed)
Office Visit Note   Patient: Sherri [name]           Date of Birth: [birth year]           MRN: [id number] Visit Date: 11/30/2017              Requested by: Binnie Rail, MD [place], [place] [id number] PCP: Binnie Rail, MD   Assessment & Plan: Visit Diagnoses:  1. Pain in right ankle and joints of right foot     Plan: Acute onset of right foot pain after "blacking out" 48 hours ago.  Films demonstrate an occult fracture at the base of the second metatarsal.  Patient has a rolling walker and an equalizer boot which she will continue to use.  We will re evaluate in 2 weeks and possibly repeat the films  Follow-Up Instructions: Return in about 2 weeks (around 12/14/2017).   Orders:  Orders Placed This Encounter  Procedures  . XR Foot Complete Right   No orders of the defined types were placed in this encounter.     Procedures: No procedures performed   Clinical Data: No additional findings.   Subjective: Chief Complaint  Patient presents with  . Follow-up    PT FAINTED AND FELL SAT AND HURT FOOT  Sherri [name] "passed out" Saturday i.e. 2 days ago she thinks may be related to her urinary tract infection and treatment.  She has not had problem with this in the past.  When she "woke up" she was complaining of pain in her right foot.  Number of hours she began to experience more diffuse midfoot pain associated with swelling.  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  SHe does have a history of rheumatoid arthritis treated by Sherri [name].  HPI  Review of Systems  Constitutional: Negative for fatigue and fever.  HENT: Negative for ear pain.   Eyes: Negative for pain.  Respiratory: Negative for cough and shortness of breath.   Cardiovascular: Positive for leg swelling.  Gastrointestinal: Negative for constipation and diarrhea.  Genitourinary: Negative for difficulty urinating.  Musculoskeletal: Positive for  neck pain. Negative for back pain.  Skin: Negative for rash.  Allergic/Immunologic: Negative for food allergies.  Neurological: Positive for weakness. Negative for numbness.  Hematological: Bruises/bleeds easily.  Psychiatric/Behavioral: Positive for sleep disturbance.     Objective: Vital Signs: BP 103/65 (BP Location: Left Arm, Patient Position: Sitting, Cuff Size: Normal)   Pulse 68   Ht 5\' 2"  (1.575 m)   Wt 112 lb (50.8 kg)   BMI 20.49 kg/m   Physical Exam  Constitutional: She is oriented to person, place, and time. She appears well-developed and well-nourished.  HENT:  Mouth/Throat: Oropharynx is clear and moist.  Eyes: Pupils are equal, round, and reactive to light. EOM are normal.  Pulmonary/Chest: Effort normal.  Neurological: She is alert and oriented to person, place, and time.  Skin: Skin is warm and dry.  Psychiatric: She has a normal mood and affect. Her behavior is normal.    Ortho Exam awake alert and oriented x3.  Comfortable sitting.  Examination of the right foot reveals diffuse mild edema of the entire midfoot with multiple areas of tenderness.  Foot is warm.  Neurovascular exam intact.  No forefoot pain.  No heel pain.  No ankle discomfort.  Specialty Comments:  No specialty comments available.  Imaging: Xr Foot Complete Right  Result Date:  11/30/2017 Films of the right foot obtained in 3 projections.  There is some mild arthritic change about the metatarsal phalangeal joint of the great toe which is asymptomatic.  There is some decreased bone demineralization diffusely.  On the AP there is a suggestion of a possible nondisplaced fracture at the base of the second metatarsal which is slightly more obvious on the oblique film.  No evidence of subluxation.  Some mild degenerative changes in the midfoot    PMFS History: Patient Active Problem List   Diagnosis Date Noted  . S/P cervical spinal fusion 11/10/2017  . Swelling of left foot 10/26/2017  .  Dysphagia 10/26/2017  . Left ankle swelling 10/26/2017  . Localized swelling, mass, and lump of head 08/18/2017  . Gastroesophageal reflux disease 08/06/2017  . Easy bruising 08/06/2017  . Anxiety 04/24/2017  . Mid back pain 03/26/2017  . Left sided chest pain 03/25/2017  . Dyspnea on exertion 03/14/2017  . Hyperkalemia 03/13/2017  . Rash and nonspecific skin eruption 01/08/2017  . Submandibular gland swelling 01/08/2017  . High risk medication use 09/02/2016  . Urinary frequency 09/02/2016  . Piriformis muscle pain 09/02/2016  . Fatigue 07/22/2016  . Hair loss 07/22/2016  . Right hip pain 04/08/2016  . Right knee pain 02/25/2016  . Dyspepsia 04/05/2015  . Numbness 11/14/2014  . Biceps tendon tear 10/10/2014  . Insomnia 10/10/2014  . Gait disturbance 06/13/2014  . 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 OP (osteoporosis) 01/18/2014  . Other long term (current) drug therapy 01/18/2014  . Other dysphagia 12/29/2013  . Hoarseness 12/29/2013  . 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 Personal history of colonic polyps 07/03/2010  . Nocturia 05/28/2010  . THYROID NODULE 05/17/2008  . Multiple sclerosis (Sherri [name]) 05/17/2008  . Chronic fatigue 03/30/2007  . Hyperlipidemia 08/20/2006  . Essential hypertension 08/20/2006  . Rheumatoid arthritis (Sherri [name]) 08/20/2006  . CERVICAL CANCER, HX OF 08/20/2006  . GASTRIC ULCER, HX OF 08/20/2006   Past Medical History:  Diagnosis Date  . Anxiety    takes Valium daily as needed  . Bruises easily    d/t meds  . Cancer ([place])    basal cell ca, in situ- uterine   . Cataracts, bilateral   . Chronic back pain   . Dizziness    r/t to meds  . GERD (gastroesophageal reflux disease)    no meds on a regular basis but will take Tums if needed  . Headache(784.0)    r/t neck issues  . History of bronchitis 6-31yrs ago  . History of colon polyps    benign  . Hyperlipidemia    takes Atorvastatin on Mondays and Fridays  . Hypertension      takes Ramipril daily  . Joint pain   . Joint swelling   . Multiple sclerosis (Sherri [name])   . Neuromuscular disorder (Sherri [name])    Sherri [name]- Sherri [name], follows M.S.  . Nocturia   . OA (osteoarthritis)    Sherri [name] weekly, RA- hands- knees- feet   . PONV (postoperative nausea and vomiting)    trouble urinating after surgery in 2014  . Rheumatoid arthritis(714.0)    Sherri [name] takes [name] daily  . Skin cancer   . Urinary retention    sees SherriWrenn about 2 times a yr    Family History  Problem Relation Age of Onset  . Cancer Mother        pancreatic  . Diabetes Mother   . Heart disease Father  Rheumatic  . Cancer Father        ? stomach  . Cancer Sister        stomach  . Cancer Maternal Aunt        X 4; ? primary  . Heart disease Paternal Aunt   . Cancer Maternal Grandmother        cervical  . Colon cancer Unknown        Aunts  . Multiple sclerosis Daughter   . Hypertension Neg Hx   . Stroke Neg Hx     Past Surgical History:  Procedure Laterality Date  . ABDOMINAL HYSTERECTOMY     1985  . APPENDECTOMY     with TAH  . BACK SURGERY     several  . BROW LIFT Bilateral 10/02/2016   Procedure: BILATERAL LOWER LID BLEPHAROPLASTY;  Surgeon: Clista Bernhardt, MD;  Location: [facility];  Service: Plastics;  Laterality: Bilateral;  . CARPAL TUNNEL RELEASE Right   . cataracts    . CERVICAL FUSION     Sherri Joya Salm  . CERVICAL FUSION  12/31/2012   Sherri Joya Salm  . CHEST TUBE INSERTION     for traumatic Pneumothorax  . COLONOSCOPY  07/16/2010   normal   . COLONOSCOPY W/ POLYPECTOMY  1997   negative since; Sherri Deatra Ina  . ESOPHAGOGASTRODUODENOSCOPY  07/16/2010   normal  . eye lid raise    . EYE SURGERY Bilateral    cataracts removed - /w IOL  . JOINT REPLACEMENT    . LUMBAR FUSION     Sherri Joya Salm  . NASAL SINUS SURGERY    . POSTERIOR CERVICAL FUSION/FORAMINOTOMY N/A 12/31/2012   Procedure: POSTERIOR LATERAL CERVICAL FUSION/FORAMINOTOMY LEVEL 1 CERVICAL  THREE-FOUR WITH LATERAL MASS SCREWS;  Surgeon: Floyce Stakes, MD;  Location: [facility];  Service: Neurosurgery;  Laterality: N/A;  . PTOSIS REPAIR Bilateral 10/02/2016   Procedure: INTERNAL PTOSIS REPAIR;  Surgeon: Clista Bernhardt, MD;  Location: [facility];  Service: Plastics;  Laterality: Bilateral;  . SKIN BIOPSY    . THYROID SURGERY     R lobe removed- 1972, has grown back - CT last done- 2018  . TOTAL HIP ARTHROPLASTY  12/22/2011   Procedure: TOTAL HIP ARTHROPLASTY;  Surgeon: Kerin Salen, MD;  Location: [facility];  Service: Orthopedics;  Laterality: Left;  . TOTAL SHOULDER ARTHROPLASTY    . TUBAL LIGATION     Social History   Occupational History  . Occupation: retired  Tobacco Use  . Smoking status: Former Smoker    Packs/day: 2.50    Years: 20.00    Pack years: 50.00    Types: Cigarettes    Last attempt to quit: 05/26/1981    Years since quitting: 36.5  . Smokeless tobacco: Never Used  . Tobacco comment: Smoked [phone], up to 2.5 ppd  Substance and Sexual Activity  . Alcohol use: Yes    Alcohol/week: 10.8 oz    Types: 18 Cans of beer per week    Comment: 4% beer  . Drug use: Never  . Sexual activity: Yes

## 2017-12-01 NOTE — Progress Notes (Signed)
Labs are stable.

## 2017-12-09 DIAGNOSIS — R490 Dysphonia: Secondary | ICD-10-CM | POA: Diagnosis not present

## 2017-12-09 DIAGNOSIS — K219 Gastro-esophageal reflux disease without esophagitis: Secondary | ICD-10-CM | POA: Diagnosis not present

## 2017-12-09 DIAGNOSIS — H903 Sensorineural hearing loss, bilateral: Secondary | ICD-10-CM | POA: Diagnosis not present

## 2017-12-09 DIAGNOSIS — R1312 Dysphagia, oropharyngeal phase: Secondary | ICD-10-CM | POA: Diagnosis not present

## 2017-12-10 ENCOUNTER — Telehealth: Payer: Self-pay | Admitting: Rheumatology

## 2017-12-10 NOTE — Telephone Encounter (Signed)
Patient left a voicemail stating she is scheduled to see Dr. [name] on 12/17/17 and he is requesting a copy of her x-ray of her spine that was done at her appointment with Lovena Le on 10/12/17.   Patient requests a return call to let her know when it is ready to be picked up.

## 2017-12-11 NOTE — Telephone Encounter (Signed)
I called patient, CD at front desk. 

## 2017-12-14 ENCOUNTER — Ambulatory Visit (INDEPENDENT_AMBULATORY_CARE_PROVIDER_SITE_OTHER): Payer: Medicare Other | Admitting: Orthopaedic Surgery

## 2017-12-17 DIAGNOSIS — M4802 Spinal stenosis, cervical region: Secondary | ICD-10-CM | POA: Diagnosis not present

## 2017-12-17 DIAGNOSIS — M5413 Radiculopathy, cervicothoracic region: Secondary | ICD-10-CM | POA: Diagnosis not present

## 2017-12-17 DIAGNOSIS — R030 Elevated blood-pressure reading, without diagnosis of hypertension: Secondary | ICD-10-CM | POA: Diagnosis not present

## 2017-12-22 DIAGNOSIS — N3010 Interstitial cystitis (chronic) without hematuria: Secondary | ICD-10-CM | POA: Diagnosis not present

## 2017-12-22 DIAGNOSIS — N390 Urinary tract infection, site not specified: Secondary | ICD-10-CM | POA: Diagnosis not present

## 2017-12-31 ENCOUNTER — Telehealth: Payer: Self-pay | Admitting: Rheumatology

## 2017-12-31 NOTE — Telephone Encounter (Signed)
Patient called requesting samples of Xeljanz.  Patient states she is out of medication.  Patient requested a return call.

## 2017-12-31 NOTE — Telephone Encounter (Signed)
Patient advised she may come by the office and pick up Xeljanz samples.

## 2018-01-04 ENCOUNTER — Other Ambulatory Visit: Payer: Self-pay | Admitting: Internal Medicine

## 2018-01-07 DIAGNOSIS — I8312 Varicose veins of left lower extremity with inflammation: Secondary | ICD-10-CM | POA: Diagnosis not present

## 2018-01-07 DIAGNOSIS — I8311 Varicose veins of right lower extremity with inflammation: Secondary | ICD-10-CM | POA: Diagnosis not present

## 2018-01-07 DIAGNOSIS — I83813 Varicose veins of bilateral lower extremities with pain: Secondary | ICD-10-CM | POA: Diagnosis not present

## 2018-01-11 ENCOUNTER — Encounter: Payer: Self-pay | Admitting: Rheumatology

## 2018-01-11 DIAGNOSIS — Z1231 Encounter for screening mammogram for malignant neoplasm of breast: Secondary | ICD-10-CM | POA: Diagnosis not present

## 2018-01-11 DIAGNOSIS — Z01419 Encounter for gynecological examination (general) (routine) without abnormal findings: Secondary | ICD-10-CM | POA: Diagnosis not present

## 2018-01-11 DIAGNOSIS — M816 Localized osteoporosis [Lequesne]: Secondary | ICD-10-CM | POA: Diagnosis not present

## 2018-01-11 DIAGNOSIS — N958 Other specified menopausal and perimenopausal disorders: Secondary | ICD-10-CM | POA: Diagnosis not present

## 2018-01-11 DIAGNOSIS — Z6820 Body mass index (BMI) 20.0-20.9, adult: Secondary | ICD-10-CM | POA: Diagnosis not present

## 2018-01-13 ENCOUNTER — Other Ambulatory Visit: Payer: Self-pay | Admitting: Obstetrics and Gynecology

## 2018-01-13 DIAGNOSIS — R921 Mammographic calcification found on diagnostic imaging of breast: Secondary | ICD-10-CM

## 2018-01-13 NOTE — Progress Notes (Signed)
Office Visit Note  Patient: Sherri [name]             Date of Birth: 24-Dec-1940           MRN: [id number]             PCP: Binnie Rail, MD Referring: Binnie Rail, MD Visit Date: 01/26/2018 Occupation: @GUAROCC @  Subjective:  pain in multiple joints  History of Present Illness: Sherri [name] is a 77 y.o. female with history of seropositive rheumatoid arthritis, osteoarthritis, osteoporosis, and DDD.  According to patient she continues to have discomfort in her bilateral hands and her bilateral feet.  Although the swelling has been better on the combination therapy currently.  She fell in February when she passed out and fractured her foot.  Which is healed now but is still continues to hurt.  She states she had a bone density couple of weeks ago.  Neck pain is tolerable.  Activities of Daily Living:  Patient reports morning stiffness for 4  hours.   Patient Reports nocturnal pain.  Difficulty dressing/grooming: Denies Difficulty climbing stairs: Reports Difficulty getting out of chair: Denies Difficulty using hands for taps, buttons, cutlery, and/or writing: Reports  Review of Systems  Constitutional: Positive for fatigue. Negative for night sweats, weight gain and weight loss.  HENT: Negative for mouth sores, trouble swallowing, trouble swallowing, mouth dryness and nose dryness.   Eyes: Positive for dryness. Negative for pain, redness and visual disturbance.  Respiratory: Negative for cough, shortness of breath and difficulty breathing.   Cardiovascular: Negative for chest pain, palpitations, hypertension, irregular heartbeat and swelling in legs/feet.  Gastrointestinal: Positive for constipation. Negative for blood in stool and diarrhea.  Endocrine: Negative for increased urination.  Genitourinary: Negative for vaginal dryness.  Musculoskeletal: Positive for arthralgias, joint pain, joint swelling and morning stiffness. Negative for myalgias, muscle weakness, muscle  tenderness and myalgias.  Skin: Negative for color change, rash, hair loss, skin tightness, ulcers and sensitivity to sunlight.  Allergic/Immunologic: Negative for susceptible to infections.  Neurological: Negative for dizziness, memory loss, night sweats and weakness.  Hematological: Negative for swollen glands.  Psychiatric/Behavioral: Negative for depressed mood and sleep disturbance. The patient is not nervous/anxious.     PMFS History:  Patient Active Problem List   Diagnosis Date Noted  . S/P cervical spinal fusion 11/10/2017  . Swelling of left foot 10/26/2017  . Dysphagia 10/26/2017  . Left ankle swelling 10/26/2017  . Localized swelling, mass, and lump of head 08/18/2017  . Gastroesophageal reflux disease 08/06/2017  . Easy bruising 08/06/2017  . Anxiety 04/24/2017  . Mid back pain 03/26/2017  . Left sided chest pain 03/25/2017  . Dyspnea on exertion 03/14/2017  . Hyperkalemia 03/13/2017  . Rash and nonspecific skin eruption 01/08/2017  . Submandibular gland swelling 01/08/2017  . High risk medication use 09/02/2016  . Urinary frequency 09/02/2016  . Piriformis muscle pain 09/02/2016  . Fatigue 07/22/2016  . Hair loss 07/22/2016  . Right hip pain 04/08/2016  . Right knee pain 02/25/2016  . Dyspepsia 04/05/2015  . Numbness 11/14/2014  . Biceps tendon tear 10/10/2014  . Insomnia 10/10/2014  . Gait disturbance 06/13/2014  . 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 OP (osteoporosis) 01/18/2014  . Other long term (current) drug therapy 01/18/2014  . Other dysphagia 12/29/2013  . Hoarseness 12/29/2013  . 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 Personal history of colonic polyps 07/03/2010  . Nocturia 05/28/2010  . THYROID NODULE 05/17/2008  .  Multiple sclerosis ([place]) 05/17/2008  . Chronic fatigue 03/30/2007  . Hyperlipidemia 08/20/2006  . Essential hypertension 08/20/2006  . Rheumatoid arthritis ([place]) 08/20/2006  . CERVICAL CANCER, HX OF 08/20/2006    . GASTRIC ULCER, HX OF 08/20/2006    Past Medical History:  Diagnosis Date  . Anxiety    takes Valium daily as needed  . Bruises easily    d/t meds  . Cancer ([place])    basal cell ca, in situ- uterine   . Cataracts, bilateral   . Chronic back pain   . Dizziness    r/t to meds  . GERD (gastroesophageal reflux disease)    no meds on a regular basis but will take Tums if needed  . Headache(784.0)    r/t neck issues  . History of bronchitis 6-10yrs ago  . History of colon polyps    benign  . Hyperlipidemia    takes Atorvastatin on Mondays and Fridays  . Hypertension    takes Ramipril daily  . Joint pain   . Joint swelling   . Multiple sclerosis ([place])   . Neuromuscular disorder ([place])    Dr. Park Liter- [facility], follows [facility]  . Nocturia   . OA (osteoarthritis)    Dr Jani Gravel weekly, RA- hands- knees- feet   . PONV (postoperative nausea and vomiting)    trouble urinating after surgery in 2014  . Rheumatoid arthritis(714.0)    Dr Estanislado Pandy takes [name] daily  . Skin cancer   . Urinary retention    sees Dr.Wrenn about 2 times a yr    Family History  Problem Relation Age of Onset  . Cancer Mother        pancreatic  . Diabetes Mother   . Heart disease Father        Rheumatic  . Cancer Father        ? stomach  . Cancer Sister        stomach  . Cancer Maternal Aunt        X 4; ? primary  . Heart disease Paternal Aunt   . Cancer Maternal Grandmother        cervical  . Colon cancer Unknown        Aunts  . Multiple sclerosis Daughter   . Hypertension Neg Hx   . Stroke Neg Hx    Past Surgical History:  Procedure Laterality Date  . ABDOMINAL HYSTERECTOMY     1985  . APPENDECTOMY     with TAH  . BACK SURGERY     several  . BREAST BIOPSY Right    Results were negative  . BROW LIFT Bilateral 10/02/2016   Procedure: BILATERAL LOWER LID BLEPHAROPLASTY;  Surgeon: Clista Bernhardt, MD;  Location: [facility];  Service: Plastics;  Laterality: Bilateral;   . CARPAL TUNNEL RELEASE Right   . cataracts    . CERVICAL FUSION     Dr Joya Salm  . CERVICAL FUSION  12/31/2012   Dr Joya Salm  . CHEST TUBE INSERTION     for traumatic Pneumothorax  . COLONOSCOPY  07/16/2010   normal   . COLONOSCOPY W/ POLYPECTOMY  1997   negative since; Dr Deatra Ina  . ESOPHAGOGASTRODUODENOSCOPY  07/16/2010   normal  . eye lid raise    . EYE SURGERY Bilateral    cataracts removed - /w IOL  . JOINT REPLACEMENT    . LUMBAR FUSION     Dr Joya Salm  . NASAL SINUS SURGERY    . POSTERIOR CERVICAL FUSION/FORAMINOTOMY  N/A 12/31/2012   Procedure: POSTERIOR LATERAL CERVICAL FUSION/FORAMINOTOMY LEVEL 1 CERVICAL THREE-FOUR WITH LATERAL MASS SCREWS;  Surgeon: Floyce Stakes, MD;  Location: [facility];  Service: Neurosurgery;  Laterality: N/A;  . PTOSIS REPAIR Bilateral 10/02/2016   Procedure: INTERNAL PTOSIS REPAIR;  Surgeon: Clista Bernhardt, MD;  Location: [facility];  Service: Plastics;  Laterality: Bilateral;  . SKIN BIOPSY    . THYROID SURGERY     R lobe removed- 1972, has grown back - CT last done- 2018  . TOTAL HIP ARTHROPLASTY  12/22/2011   Procedure: TOTAL HIP ARTHROPLASTY;  Surgeon: Kerin Salen, MD;  Location: [facility];  Service: Orthopedics;  Laterality: Left;  . TOTAL SHOULDER ARTHROPLASTY    . TUBAL LIGATION     Social History   Social History Narrative  . Not on file    Objective: Vital Signs: BP (!) 85/57 (BP Location: Left Arm, Patient Position: Sitting, Cuff Size: Small)   Pulse 64   Resp 12   Ht 5\' 3"  (1.6 m)   Wt 114 lb 12.8 oz (52.1 kg)   BMI 20.34 kg/m    Physical Exam  Constitutional: She is oriented to person, place, and time. She appears well-developed and well-nourished.  HENT:  Head: Normocephalic and atraumatic.  Eyes: Conjunctivae and EOM are normal.  Neck: Normal range of motion.  Cardiovascular: Normal rate, regular rhythm, normal heart sounds and intact distal pulses.  Pulmonary/Chest: Effort normal and breath sounds normal.  Abdominal:  Soft. Bowel sounds are normal.  Lymphadenopathy:    She has no cervical adenopathy.  Neurological: She is alert and oriented to person, place, and time.  Skin: Skin is warm and dry. Capillary refill takes less than 2 seconds.  Psychiatric: She has a normal mood and affect. Her behavior is normal.  Nursing note and vitals reviewed.    Musculoskeletal Exam: C-spine limited range of motion.  Thoracic and lumbar spine good range of motion.  Shoulder joints elbow joints wrist joints were in good range of motion.  She has synovial thickening over wrist joints and MCP joints.  There was some synovitis over bilateral fourth PIP joints.  Hip joints were in good range of motion.  She had left total hip replacement which is doing well.  She was there was no synovitis over knee joints ankles or MTPs.  She had some swelling on the dorsum of her right foot due to recent fracture.  CDAI Exam: CDAI Score: 5  Patient Global Assessment: 5 (mm); Provider Global Assessment: 5 (mm) Swollen: 2 ; Tender: 2  Joint Exam      Right  Left  PIP 4  Swollen Tender  Swollen Tender     Investigation: No additional findings.  Imaging: Mm Digital Diagnostic Unilat R  Result Date: 01/14/2018 CLINICAL DATA:  Screening recall for right breast calcifications EXAM: DIGITAL DIAGNOSTIC RIGHT MAMMOGRAM WITH CAD COMPARISON:  Previous exam(s). ACR Breast Density Category d: The breast tissue is extremely dense, which lowers the sensitivity of mammography. FINDINGS: In the upper slightly inner quadrant of the right breast, there is a 5 mm group of tightly clustered punctate and amorphous calcifications. Mammographic images were processed with CAD. IMPRESSION: There is an indeterminate group of calcifications in the upper inner right breast. The procedure has been scheduled for 01/15/2018 at 10:30 a.m. RECOMMENDATION: Stereotactic biopsy is recommended. I have discussed the findings and recommendations with the patient. Results were  also provided in writing at the conclusion of the visit. If applicable,  a reminder letter will be sent to the patient regarding the next appointment. BI-RADS CATEGORY  4: Suspicious. Electronically Signed   By: Ammie Ferrier M.D.   On: 01/14/2018 15:17   Mm Clip Placement Right  Result Date: 01/15/2018 CLINICAL DATA:  Status post stereotactic guided core biopsy of calcifications in the UPPER-OUTER QUADRANT of the RIGHT breast. EXAM: DIAGNOSTIC RIGHT MAMMOGRAM POST STEREOTACTIC BIOPSY COMPARISON:  01/11/2018 FINDINGS: Mammographic images were obtained following stereotactic guided biopsy of calcifications in the UPPER INNER QUADRANT of the RIGHT breast and placement of a coil shaped clip. Coil shaped clip is identified within the [place] adjacent to residual calcifications. IMPRESSION: Tissue marker clip in the expected location following biopsy.  The Final Assessment: Post Procedure Mammograms for Marker Placement Electronically Signed   By: Nolon Nations M.D.   On: 01/15/2018 11:35   Mm Rt Breast Bx W Loc Dev 1st Lesion Image Bx Spec Stereo Guide  Addendum Date: 01/20/2018   ADDENDUM REPORT: 01/20/2018 09:57 ADDENDUM: Pathology revealed BENIGN BREAST TISSUE WITH FIBROCYSTIC CHANGE AND MICROCALCIFICATIONS of RIGHT breast, upper inner quadrant, coil clip. This was found to be concordant by Dr. Nolon Nations. Pathology results were discussed with the patient by telephone. The patient reported doing well after the biopsy with tenderness at the site. Post biopsy instructions and care were reviewed and questions were answered. The patient was encouraged to call [facility] for any additional concerns. The patient was instructed to return for annual screening mammography and informed a reminder notice would be sent regarding this appointment. Pathology results reported by Roselind Messier, RN on 01/20/2018. Electronically Signed   By: Nolon Nations M.D.   On:  01/20/2018 09:57   Result Date: 01/20/2018 CLINICAL DATA:  Patient presents for stereotactic guided core biopsy of RIGHT breast calcifications. EXAM: RIGHT BREAST STEREOTACTIC CORE NEEDLE BIOPSY COMPARISON:  Previous exams. FINDINGS: The patient and I discussed the procedure of stereotactic-guided biopsy including benefits and alternatives. We discussed the high likelihood of a successful procedure. We discussed the risks of the procedure including infection, bleeding, tissue injury, clip migration, and inadequate sampling. Informed written consent was given. The usual time out protocol was performed immediately prior to the procedure. Using sterile technique and 1% Lidocaine as local anesthetic, under stereotactic guidance, a 9 gauge vacuum assisted device was used to perform core needle biopsy of calcifications in the UPPER INNER QUADRANT of the RIGHT breast using a cyst superior to inferior approach. Specimen radiograph was performed showing calcifications present numerous specimens. Specimens with calcifications are identified for pathology. Lesion quadrant: UPPER INNER RIGHT breast At the conclusion of the procedure, a coil shaped tissue marker clip was deployed into the biopsy cavity. Follow-up 2-view mammogram was performed and dictated separately. IMPRESSION: Stereotactic-guided biopsy of RIGHT breast calcifications. No apparent complications. Electronically Signed: By: Nolon Nations M.D. On: 01/15/2018 11:20    Recent Labs: Lab Results  Component Value Date   WBC 6.8 11/30/2017   HGB 12.2 11/30/2017   PLT 263 11/30/2017   NA 134 (L) 11/30/2017   K 4.4 11/30/2017   CL 97 (L) 11/30/2017   CO2 30 11/30/2017   GLUCOSE 93 11/30/2017   BUN 14 11/30/2017   CREATININE 0.84 11/30/2017   BILITOT 0.6 11/30/2017   ALKPHOS 47 10/26/2017   AST 23 11/30/2017   ALT 13 11/30/2017   PROT 6.1 11/30/2017   ALBUMIN 4.3 10/26/2017   CALCIUM 9.0 11/30/2017   GFRAA 79 11/30/2017   QFTBGOLDPLUS  NEGATIVE  08/31/2017    Speciality Comments: No specialty comments available.  Procedures:  No procedures performed Allergies: Darvon [propoxyphene hcl]; Meperidine hcl; Penicillins; Azathioprine; Leflunomide; and Ezetimibe-simvastatin   Assessment / Plan:     Visit Diagnoses: Rheumatoid arthritis involving multiple sites with positive rheumatoid factor (HCC)-she has difficult to treat disease and limited choices due to underlying multiple sclerosis.  She has a large co-pay for [employer].  We have been providing her with samples.  I discussed possible use of [name].  Indications side effects contraindications were discussed.  We will try to apply for it and look for the coverage.  High risk medication use - xeljanz 11 mg, MTX 6 tabs po qwk, folic acid 1 mg po qd, prednisone 4 mg po qd.  Labs are stable.  We will recheck labs in October and then every 3 months.  Age-related osteoporosis without current pathological fracture-patient has severe osteoporosis.  She has been on Reclast.  She had bone density 2 weeks ago.  We will get those results.  If her bone density does not improve much due to the recent fracture I will consider applying for Tymlos.   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-status post fusion she is doing better.  Primary osteoarthritis of right hip-minimal discomfort.  History of total hip replacement, left-doing well.  Other medical problems are listed as follows:  Other fatigue  Primary insomnia  History of hypertension  History of multiple sclerosis  History of anxiety  History of depression  History of hyperlipidemia   Orders: No orders of the defined types were placed in this encounter.  No orders of the defined types were placed in this encounter.   Face-to-face time spent with patient was 30 minutes. Greater than 50% of time was spent in counseling and coordination of care.  Follow-Up Instructions: Return in about 3 months (around 04/27/2018) for Rheumatoid  arthritis, Osteoporosis, Osteoarthritis.   Bo Merino, MD  Note - This record has been created using Editor, commissioning.  Chart creation errors have been sought, but may not always  have been located. Such creation errors do not reflect on  the standard of medical care.

## 2018-01-14 ENCOUNTER — Ambulatory Visit
Admission: RE | Admit: 2018-01-14 | Discharge: 2018-01-14 | Disposition: A | Discharge status: Home / Self Care | Payer: Medicare Other | Source: Ambulatory Visit | Attending: Obstetrics and Gynecology | Admitting: Obstetrics and Gynecology

## 2018-01-14 ENCOUNTER — Other Ambulatory Visit: Payer: Self-pay | Admitting: Obstetrics and Gynecology

## 2018-01-14 DIAGNOSIS — R921 Mammographic calcification found on diagnostic imaging of breast: Secondary | ICD-10-CM

## 2018-01-14 DIAGNOSIS — R300 Dysuria: Secondary | ICD-10-CM | POA: Diagnosis not present

## 2018-01-15 ENCOUNTER — Ambulatory Visit
Admission: RE | Admit: 2018-01-15 | Discharge: 2018-01-15 | Disposition: A | Discharge status: Home / Self Care | Payer: Medicare Other | Source: Ambulatory Visit | Attending: Obstetrics and Gynecology | Admitting: Obstetrics and Gynecology

## 2018-01-15 ENCOUNTER — Other Ambulatory Visit: Payer: Self-pay | Admitting: Obstetrics and Gynecology

## 2018-01-15 DIAGNOSIS — R921 Mammographic calcification found on diagnostic imaging of breast: Secondary | ICD-10-CM

## 2018-01-15 DIAGNOSIS — N6011 Diffuse cystic mastopathy of right breast: Secondary | ICD-10-CM | POA: Diagnosis not present

## 2018-01-20 ENCOUNTER — Telehealth: Payer: Self-pay | Admitting: Neurology

## 2018-01-20 MED ORDER — OXYCODONE HCL 10 MG PO TABS: 10.0000 mg | ORAL_TABLET | Freq: Three times a day (TID) | ORAL | 0 refills | Status: DC | PRN

## 2018-01-20 MED ORDER — TRAMADOL HCL 50 MG PO TABS: ORAL_TABLET | ORAL | 1 refills | Status: DC

## 2018-01-20 NOTE — Telephone Encounter (Signed)
Rx's awaiting RAS sig/fim 

## 2018-01-20 NOTE — Telephone Encounter (Signed)
Rx's up front [facility]/fim 

## 2018-01-20 NOTE — Telephone Encounter (Signed)
Pt request refill for Oxycodone HCl 10 MG TABS and traMADol (ULTRAM) 50 MG tablet sent to [facility]. Pt will be leaving to go out of town next week and would like to get oxycodone prior to leaving.

## 2018-01-21 NOTE — Patient Instructions (Signed)
Standing Labs We placed an order today for your standing lab work.    Please come back and get your standing labs in October and then every 3 months.  We have open lab Monday through Friday from 8:30-11:30 AM and 1:30-4:00 PM  at the office of Dr. Bo Merino.   You may experience shorter wait times on Monday and Friday afternoons. The office is located at [address] [place], [place], [place] [address] No appointment is necessary.   Labs are drawn by [facility].  You may receive a bill from [facility] for your lab work. If you have any questions regarding directions or hours of operation,  please call [phone].

## 2018-01-26 ENCOUNTER — Telehealth: Payer: Self-pay | Admitting: Pharmacy Technician

## 2018-01-26 ENCOUNTER — Ambulatory Visit: Payer: Medicare Other | Admitting: Rheumatology

## 2018-01-26 ENCOUNTER — Encounter: Payer: Self-pay | Admitting: Rheumatology

## 2018-01-26 VITALS — BP 85/57 | HR 64 | Resp 12 | Ht 63.0 in | Wt 114.8 lb

## 2018-01-26 DIAGNOSIS — M0579 Rheumatoid arthritis with rheumatoid factor of multiple sites without organ or systems involvement: Secondary | ICD-10-CM | POA: Diagnosis not present

## 2018-01-26 DIAGNOSIS — M1611 Unilateral primary osteoarthritis, right hip: Secondary | ICD-10-CM | POA: Diagnosis not present

## 2018-01-26 DIAGNOSIS — Z79899 Other long term (current) drug therapy: Secondary | ICD-10-CM | POA: Diagnosis not present

## 2018-01-26 DIAGNOSIS — M4312 Spondylolisthesis, cervical region: Secondary | ICD-10-CM

## 2018-01-26 DIAGNOSIS — M810 Age-related osteoporosis without current pathological fracture: Secondary | ICD-10-CM

## 2018-01-26 DIAGNOSIS — G35D Multiple sclerosis, unspecified: Secondary | ICD-10-CM

## 2018-01-26 DIAGNOSIS — Z96642 Presence of left artificial hip joint: Secondary | ICD-10-CM

## 2018-01-26 DIAGNOSIS — Z8659 Personal history of other mental and behavioral disorders: Secondary | ICD-10-CM

## 2018-01-26 DIAGNOSIS — Z8639 Personal history of other endocrine, nutritional and metabolic disease: Secondary | ICD-10-CM

## 2018-01-26 DIAGNOSIS — G35 Multiple sclerosis: Secondary | ICD-10-CM

## 2018-01-26 DIAGNOSIS — Z8679 Personal history of other diseases of the circulatory system: Secondary | ICD-10-CM

## 2018-01-26 DIAGNOSIS — F5101 Primary insomnia: Secondary | ICD-10-CM

## 2018-01-26 DIAGNOSIS — Z8669 Personal history of other diseases of the nervous system and sense organs: Secondary | ICD-10-CM

## 2018-01-26 DIAGNOSIS — R5383 Other fatigue: Secondary | ICD-10-CM

## 2018-01-26 NOTE — Telephone Encounter (Signed)
Received a Prior Authorization request from [employer] for [place]. Authorization has been submitted to patient's insurance via Cover My Meds. Will update once we receive a response.  3:48 PM Qusay Villada Delice Lesch

## 2018-01-26 NOTE — Telephone Encounter (Signed)
Received a fax from [employer] regarding a prior authorization for Tymlos. Authorization has been APPROVED from 01/26/2018 to 05/25/2018.   Will send document to scan center.  Authorization # [id number] Phone # [phone]  3:47 PM Lorynn Moeser Delice Lesch, CPhT

## 2018-01-27 ENCOUNTER — Other Ambulatory Visit: Payer: Self-pay | Admitting: Rheumatology

## 2018-01-27 ENCOUNTER — Telehealth: Payer: Self-pay | Admitting: Pharmacy Technician

## 2018-01-27 NOTE — Telephone Encounter (Signed)
Last visit: 01/26/2018  Next visit: 04/28/2018  Okay to refill per Dr. Estanislado Pandy.

## 2018-01-27 NOTE — Telephone Encounter (Signed)
Spoke to patient about Prior Authorization approvals for [name] and Tymlos. They both have pretty high copays. Discussed patient assistance options. Patient is interested. Will bring income documents next week for Tylmos. Advised patient that Dina Rich will be reaching out to her to discuss patient assistance for [name].  4:06 PM Beatriz Chancellor, CPhT

## 2018-01-27 NOTE — Telephone Encounter (Signed)
Received a fax from [employer] regarding a prior authorization for [name]. Authorization has been APPROVED from 01/26/18 to 05/25/18.   Will send document to scan center.  Authorization #[id number] Phone # [phone]  Ran test claim- $950.37 copay. Patient's information has been submitted to [name] Complete. Will follow up to discuss patient assistance.    8:31 AM Beatriz Chancellor, CPhT

## 2018-02-01 DIAGNOSIS — N3000 Acute cystitis without hematuria: Secondary | ICD-10-CM | POA: Diagnosis not present

## 2018-02-01 NOTE — Telephone Encounter (Signed)
Patient came in to sign patient assistance documents. She did not bring income documents. Faxed applications. Advised patient that programs may reach out to her for the income information. Will follow up.   2:20 PM Sherri [name], CPhT

## 2018-02-02 ENCOUNTER — Telehealth: Payer: Self-pay | Admitting: Pharmacy Technician

## 2018-02-02 NOTE — Telephone Encounter (Signed)
Received fax confirmation that patient assistance application for [name] was received and in process.  Phone: [phone]  8:18 AM Beatriz Chancellor, CPhT

## 2018-02-04 ENCOUNTER — Telehealth (INDEPENDENT_AMBULATORY_CARE_PROVIDER_SITE_OTHER): Payer: Self-pay | Admitting: Orthopedic Surgery

## 2018-02-04 ENCOUNTER — Ambulatory Visit (INDEPENDENT_AMBULATORY_CARE_PROVIDER_SITE_OTHER): Payer: Medicare Other | Admitting: Orthopedic Surgery

## 2018-02-04 ENCOUNTER — Encounter (INDEPENDENT_AMBULATORY_CARE_PROVIDER_SITE_OTHER): Payer: Self-pay | Admitting: Orthopedic Surgery

## 2018-02-04 VITALS — Ht 63.0 in | Wt 114.8 lb

## 2018-02-04 DIAGNOSIS — M7672 Peroneal tendinitis, left leg: Secondary | ICD-10-CM | POA: Diagnosis not present

## 2018-02-04 DIAGNOSIS — M6788 Other specified disorders of synovium and tendon, other site: Secondary | ICD-10-CM

## 2018-02-04 NOTE — Progress Notes (Signed)
Office Visit Note   Patient: Sherri [name]           Date of Birth: Sep 12, 1940           MRN: [id number] Visit Date: 02/04/2018              Requested by: Binnie Rail, MD [place], [place] [address] PCP: Binnie Rail, MD  Chief Complaint  Patient presents with  . Right Foot - Follow-up, Pain    Ankle and Foot      HPI: Patient is a 77 year old woman with rheumatoid arthritis who presents with acute peroneal tendinitis on the left.  Patient wants to know if this route is related to her generalized arthritis.  Assessment & Plan: Visit Diagnoses:  1. Peroneal tendinosis, left     Plan: We will place patient in an ASO to unload stress on the peroneal tendon she has good stiff soled new balance sneakers with good arch support orthotics.  Recommended she could try CBD lotion.  Discussed risks of steroid injections and could consider injection if she fails treatment with the ASO.  Follow-Up Instructions: Return if symptoms worsen or fail to improve.   Ortho Exam  Patient is alert, oriented, no adenopathy, well-dressed, normal affect, normal respiratory effort. Examination patient has a good dorsalis pedis pulse she has good dorsiflexion of the ankle she has good ankle and subtalar motion there is no varus or valgus deformity to her foot.  The Achilles and S2 tibial tendon are nontender to palpation she is tender to palpation along the peroneal tendons but there is no swelling.  There is no tenderness at the insertion of the peroneus brevis.  Her foot is neurovascular intact.  She does have ulnar deviation of the MCP joints of her hands but no malalignment to the foot or ankle.  Imaging: No results found. No images are attached to the encounter.  Labs: Lab Results  Component Value Date   HGBA1C 5.6 10/24/2013   HGBA1C 5.2 11/11/2011   HGBA1C 5.6 05/17/2008   ESRSEDRATE 30 07/22/2016   CRP 0.3 (L) 07/22/2016   REPTSTATUS 10/26/2014 FINAL 10/20/2014   CULT   10/20/2014    NO GROWTH 5 DAYS Performed at [facility]  06/05/2016    Three or more organisms present,each greater than 10,000 CFU/mL.These organisms,commonly found on external and internal genitalia,are considered to be colonizers.No further testing performed.      Lab Results  Component Value Date   ALBUMIN 4.3 10/26/2017   ALBUMIN 4.1 08/18/2017   ALBUMIN 4.1 04/24/2017    Body mass index is 20.34 kg/m.  Orders:  No orders of the defined types were placed in this encounter.  No orders of the defined types were placed in this encounter.    Procedures: No procedures performed  Clinical Data: No additional findings.  ROS:  All other systems negative, except as noted in the HPI. Review of Systems  Objective: Vital Signs: Ht 5\' 3"  (1.6 m)   Wt 114 lb 12.8 oz (52.1 kg)   BMI 20.34 kg/m   Specialty Comments:  No specialty comments available.  PMFS History: Patient Active Problem List   Diagnosis Date Noted  . S/P cervical spinal fusion 11/10/2017  . Swelling of left foot 10/26/2017  . Dysphagia 10/26/2017  . Left ankle swelling 10/26/2017  . Localized swelling, mass, and lump of head 08/18/2017  . Gastroesophageal reflux disease 08/06/2017  . Easy bruising 08/06/2017  .  Anxiety 04/24/2017  . Mid back pain 03/26/2017  . Left sided chest pain 03/25/2017  . Dyspnea on exertion 03/14/2017  . Hyperkalemia 03/13/2017  . Rash and nonspecific skin eruption 01/08/2017  . Submandibular gland swelling 01/08/2017  . High risk medication use 09/02/2016  . Urinary frequency 09/02/2016  . Piriformis muscle pain 09/02/2016  . Fatigue 07/22/2016  . Hair loss 07/22/2016  . Right hip pain 04/08/2016  . Right knee pain 02/25/2016  . Dyspepsia 04/05/2015  . Numbness 11/14/2014  . Biceps tendon tear 10/10/2014  . Insomnia 10/10/2014  . Gait disturbance 06/13/2014  . 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 OP (osteoporosis) 01/18/2014  . Other  long term (current) drug therapy 01/18/2014  . Other dysphagia 12/29/2013  . Hoarseness 12/29/2013  . 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 Personal history of colonic polyps 07/03/2010  . Nocturia 05/28/2010  . THYROID NODULE 05/17/2008  . Multiple sclerosis ([place]) 05/17/2008  . Chronic fatigue 03/30/2007  . Hyperlipidemia 08/20/2006  . Essential hypertension 08/20/2006  . Rheumatoid arthritis ([place]) 08/20/2006  . CERVICAL CANCER, HX OF 08/20/2006  . GASTRIC ULCER, HX OF 08/20/2006   Past Medical History:  Diagnosis Date  . Anxiety    takes Valium daily as needed  . Bruises easily    d/t meds  . Cancer ([place])    basal cell ca, in situ- uterine   . Cataracts, bilateral   . Chronic back pain   . Dizziness    r/t to meds  . GERD (gastroesophageal reflux disease)    no meds on a regular basis but will take Tums if needed  . Headache(784.0)    r/t neck issues  . History of bronchitis 6-49yrs ago  . History of colon polyps    benign  . Hyperlipidemia    takes Atorvastatin on Mondays and Fridays  . Hypertension    takes Ramipril daily  . Joint pain   . Joint swelling   . Multiple sclerosis ([place])   . Neuromuscular disorder ([place])    Dr. Park Liter- [facility], follows M.S.  . Nocturia   . OA (osteoarthritis)    Dr Jani Gravel weekly, RA- hands- knees- feet   . PONV (postoperative nausea and vomiting)    trouble urinating after surgery in 2014  . Rheumatoid arthritis(714.0)    Dr Estanislado Pandy takes [name] daily  . Skin cancer   . Urinary retention    sees Dr.Wrenn about 2 times a yr    Family History  Problem Relation Age of Onset  . Cancer Mother        pancreatic  . Diabetes Mother   . Heart disease Father        Rheumatic  . Cancer Father        ? stomach  . Cancer Sister        stomach  . Cancer Maternal Aunt        X 4; ? primary  . Heart disease Paternal Aunt   . Cancer Maternal Grandmother        cervical  . Colon cancer  Unknown        Aunts  . Multiple sclerosis Daughter   . Hypertension Neg Hx   . Stroke Neg Hx     Past Surgical History:  Procedure Laterality Date  . ABDOMINAL HYSTERECTOMY     1985  . APPENDECTOMY     with TAH  . BACK SURGERY     several  . BREAST BIOPSY Right  Results were negative  . BROW LIFT Bilateral 10/02/2016   Procedure: BILATERAL LOWER LID BLEPHAROPLASTY;  Surgeon: Clista Bernhardt, MD;  Location: [facility];  Service: Plastics;  Laterality: Bilateral;  . CARPAL TUNNEL RELEASE Right   . cataracts    . CERVICAL FUSION     Dr Joya Salm  . CERVICAL FUSION  12/31/2012   Dr Joya Salm  . CHEST TUBE INSERTION     for traumatic Pneumothorax  . COLONOSCOPY  07/16/2010   normal   . COLONOSCOPY W/ POLYPECTOMY  1997   negative since; Dr Deatra Ina  . ESOPHAGOGASTRODUODENOSCOPY  07/16/2010   normal  . eye lid raise    . EYE SURGERY Bilateral    cataracts removed - /w IOL  . JOINT REPLACEMENT    . LUMBAR FUSION     Dr Joya Salm  . NASAL SINUS SURGERY    . POSTERIOR CERVICAL FUSION/FORAMINOTOMY N/A 12/31/2012   Procedure: POSTERIOR LATERAL CERVICAL FUSION/FORAMINOTOMY LEVEL 1 CERVICAL THREE-FOUR WITH LATERAL MASS SCREWS;  Surgeon: Floyce Stakes, MD;  Location: [facility];  Service: Neurosurgery;  Laterality: N/A;  . PTOSIS REPAIR Bilateral 10/02/2016   Procedure: INTERNAL PTOSIS REPAIR;  Surgeon: Clista Bernhardt, MD;  Location: [facility];  Service: Plastics;  Laterality: Bilateral;  . SKIN BIOPSY    . THYROID SURGERY     R lobe removed- 1972, has grown back - CT last done- 2018  . TOTAL HIP ARTHROPLASTY  12/22/2011   Procedure: TOTAL HIP ARTHROPLASTY;  Surgeon: Kerin Salen, MD;  Location: [facility];  Service: Orthopedics;  Laterality: Left;  . TOTAL SHOULDER ARTHROPLASTY    . TUBAL LIGATION     Social History   Occupational History  . Occupation: retired  Tobacco Use  . Smoking status: Former Smoker    Packs/day: 2.50    Years: 20.00    Pack years: 50.00    Types: Cigarettes     Last attempt to quit: 05/26/1981    Years since quitting: 36.7  . Smokeless tobacco: Never Used  . Tobacco comment: Smoked [phone], up to 2.5 ppd  Substance and Sexual Activity  . Alcohol use: Yes    Alcohol/week: 14.0 standard drinks    Types: 14 Cans of beer per week    Comment: 4% beer  . Drug use: Never  . Sexual activity: Yes

## 2018-02-04 NOTE — Telephone Encounter (Signed)
Patient called stated the brace she got today is sliding down and will not stay on her foot. Patient said the brace is a size large and is to big. Patient said she want to come pickup another brace. The number to contact patient is [phone]

## 2018-02-04 NOTE — Telephone Encounter (Signed)
IC patient. She will ask for me when she gets here and we will refit her for brace.

## 2018-02-05 ENCOUNTER — Other Ambulatory Visit: Payer: Self-pay | Admitting: Internal Medicine

## 2018-02-08 NOTE — Telephone Encounter (Signed)
Fax number for [employer] will not work. After multiple attempts, mailed application. Will follow up in 1 week.  10:21 AM Beatriz Chancellor, CPhT

## 2018-02-10 ENCOUNTER — Ambulatory Visit: Payer: Medicare Other | Admitting: Rheumatology

## 2018-02-12 NOTE — Telephone Encounter (Signed)
Called [employer] to follow up on application, they have been unable to reach patient, so a letter was mailed requesting proof of income. Called patient, no answer and unable to leave a message.  Will follow up. Phone# [phone]  9:53 AM Beatriz Chancellor, CPhT

## 2018-02-13 ENCOUNTER — Other Ambulatory Visit: Payer: Self-pay | Admitting: Rheumatology

## 2018-02-15 NOTE — Telephone Encounter (Signed)
Spoke to patient, she will bring in proof of income to her appointment on Wednesday. It is needed for her [name] and [employer] applications. Called [employer], application has not received. Will follow up in a couple of days.  Phone# [phone]  10:36 AM Beatriz Chancellor, CPhT

## 2018-02-15 NOTE — Telephone Encounter (Signed)
Ran test claim, patient has a $623.43 copay. Mailed application for patient assistance on 02/08/18. Awaiting response

## 2018-02-15 NOTE — Telephone Encounter (Signed)
Spoke to patient, she will bring in proof of income to her appointment on Wednesday. It is needed for her [name] and [employer] applications.  10:35 AM Beatriz Chancellor, CPhT

## 2018-02-15 NOTE — Progress Notes (Signed)
Office Visit Note  Patient: Sherri [name]             Date of Birth: 06-28-1940           MRN: [id number]             PCP: Binnie Rail, MD Referring: Binnie Rail, MD Visit Date: 02/17/2018 Occupation: @GUAROCC @  Subjective:  Right hip pain.   History of Present Illness: Sherri [name] is a 77 y.o. female with history of rheumatoid arthritis osteoarthritis and degenerative disc disease.  She states she recently went on a vacation with her daughter and had a hard time walking due to pain in her right hip which she describes over the trochanteric area.  She states the back pain radiates down into her right thigh.  She has been also having some lower back pain which she describes over the SI joint area.  She continues to have pain and discomfort in her bilateral hands and wrist joints.  She has noticed one ulcer on her right lower extremity few months back which has recurred now.  She stated had a scab which she scraped off.  Is not having any neck discomfort currently.  She is also still on prednisone 4 mg p.o. Daily.  Activities of Daily Living:  Patient reports morning stiffness for 4-5 hours.   Patient Reports nocturnal pain.  Difficulty dressing/grooming: Denies Difficulty climbing stairs: Reports Difficulty getting out of chair: Reports Difficulty using hands for taps, buttons, cutlery, and/or writing: Reports  Review of Systems  Constitutional: Positive for fatigue. Negative for night sweats, weight gain and weight loss.  HENT: Negative for mouth sores, trouble swallowing, trouble swallowing, mouth dryness and nose dryness.   Eyes: Negative for pain, redness, visual disturbance and dryness.  Respiratory: Negative for cough, shortness of breath and difficulty breathing.   Cardiovascular: Negative for chest pain, palpitations, hypertension, irregular heartbeat and swelling in legs/feet.  Gastrointestinal: Negative for blood in stool, constipation and diarrhea.    Endocrine: Negative for increased urination.  Genitourinary: Negative for vaginal dryness.  Musculoskeletal: Positive for arthralgias, joint pain, joint swelling and morning stiffness. Negative for myalgias, muscle weakness, muscle tenderness and myalgias.  Skin: Positive for rash. Negative for color change, hair loss, skin tightness, ulcers and sensitivity to sunlight.  Allergic/Immunologic: Negative for susceptible to infections.  Neurological: Negative for dizziness, memory loss, night sweats and weakness.  Hematological: Negative for swollen glands.  Psychiatric/Behavioral: Positive for sleep disturbance. Negative for depressed mood. The patient is not nervous/anxious.     PMFS History:  Patient Active Problem List   Diagnosis Date Noted  . S/P cervical spinal fusion 11/10/2017  . Swelling of left foot 10/26/2017  . Dysphagia 10/26/2017  . Left ankle swelling 10/26/2017  . Localized swelling, mass, and lump of head 08/18/2017  . Gastroesophageal reflux disease 08/06/2017  . Easy bruising 08/06/2017  . Anxiety 04/24/2017  . Mid back pain 03/26/2017  . Left sided chest pain 03/25/2017  . Dyspnea on exertion 03/14/2017  . Hyperkalemia 03/13/2017  . Rash and nonspecific skin eruption 01/08/2017  . Submandibular gland swelling 01/08/2017  . High risk medication use 09/02/2016  . Urinary frequency 09/02/2016  . Piriformis muscle pain 09/02/2016  . Fatigue 07/22/2016  . Hair loss 07/22/2016  . Right hip pain 04/08/2016  . Right knee pain 02/25/2016  . Dyspepsia 04/05/2015  . Numbness 11/14/2014  . Biceps tendon tear 10/10/2014  . Insomnia 10/10/2014  . Gait disturbance 06/13/2014  . Depression  with anxiety 06/13/2014  . OP (osteoporosis) 01/18/2014  . Other long term (current) drug therapy 01/18/2014  . Other dysphagia 12/29/2013  . Hoarseness 12/29/2013  . 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 Personal history of colonic polyps 07/03/2010  . Nocturia  05/28/2010  . THYROID NODULE 05/17/2008  . Multiple sclerosis ([place]) 05/17/2008  . Chronic fatigue 03/30/2007  . Hyperlipidemia 08/20/2006  . Essential hypertension 08/20/2006  . Rheumatoid arthritis ([place]) 08/20/2006  . CERVICAL CANCER, HX OF 08/20/2006  . GASTRIC ULCER, HX OF 08/20/2006    Past Medical History:  Diagnosis Date  . Anxiety    takes Valium daily as needed  . Bruises easily    d/t meds  . Cancer ([place])    basal cell ca, in situ- uterine   . Cataracts, bilateral   . Chronic back pain   . Dizziness    r/t to meds  . GERD (gastroesophageal reflux disease)    no meds on a regular basis but will take Tums if needed  . Headache(784.0)    r/t neck issues  . History of bronchitis 6-63yrs ago  . History of colon polyps    benign  . Hyperlipidemia    takes Atorvastatin on Mondays and Fridays  . Hypertension    takes Ramipril daily  . Joint pain   . Joint swelling   . Multiple sclerosis ([place])   . Neuromuscular disorder ([place])    Dr. Park Liter- [facility], follows M.S.  . Nocturia   . OA (osteoarthritis)    Dr Jani Gravel weekly, RA- hands- knees- feet   . PONV (postoperative nausea and vomiting)    trouble urinating after surgery in 2014  . Rheumatoid arthritis(714.0)    Dr Estanislado Pandy takes [name] daily  . Skin cancer   . Urinary retention    sees Dr.Wrenn about 2 times a yr    Family History  Problem Relation Age of Onset  . Cancer Mother        pancreatic  . Diabetes Mother   . Heart disease Father        Rheumatic  . Cancer Father        ? stomach  . Cancer Sister        stomach  . Cancer Maternal Aunt        X 4; ? primary  . Heart disease Paternal Aunt   . Cancer Maternal Grandmother        cervical  . Colon cancer Unknown        Aunts  . Multiple sclerosis Daughter   . Hypertension Neg Hx   . Stroke Neg Hx    Past Surgical History:  Procedure Laterality Date  . ABDOMINAL HYSTERECTOMY     1985  . APPENDECTOMY     with TAH    . BACK SURGERY     several  . BREAST BIOPSY Right    Results were negative  . BROW LIFT Bilateral 10/02/2016   Procedure: BILATERAL LOWER LID BLEPHAROPLASTY;  Surgeon: Clista Bernhardt, MD;  Location: [facility];  Service: Plastics;  Laterality: Bilateral;  . CARPAL TUNNEL RELEASE Right   . cataracts    . CERVICAL FUSION     Dr Joya Salm  . CERVICAL FUSION  12/31/2012   Dr Joya Salm  . CHEST TUBE INSERTION     for traumatic Pneumothorax  . COLONOSCOPY  07/16/2010   normal   . COLONOSCOPY W/ POLYPECTOMY  1997   negative since; Dr Deatra Ina  .  ESOPHAGOGASTRODUODENOSCOPY  07/16/2010   normal  . eye lid raise    . EYE SURGERY Bilateral    cataracts removed - /w IOL  . JOINT REPLACEMENT    . LUMBAR FUSION     Dr Joya Salm  . NASAL SINUS SURGERY    . POSTERIOR CERVICAL FUSION/FORAMINOTOMY N/A 12/31/2012   Procedure: POSTERIOR LATERAL CERVICAL FUSION/FORAMINOTOMY LEVEL 1 CERVICAL THREE-FOUR WITH LATERAL MASS SCREWS;  Surgeon: Floyce Stakes, MD;  Location: [facility];  Service: Neurosurgery;  Laterality: N/A;  . PTOSIS REPAIR Bilateral 10/02/2016   Procedure: INTERNAL PTOSIS REPAIR;  Surgeon: Clista Bernhardt, MD;  Location: [facility];  Service: Plastics;  Laterality: Bilateral;  . SKIN BIOPSY    . THYROID SURGERY     R lobe removed- 1972, has grown back - CT last done- 2018  . TOTAL HIP ARTHROPLASTY  12/22/2011   Procedure: TOTAL HIP ARTHROPLASTY;  Surgeon: Kerin Salen, MD;  Location: [facility];  Service: Orthopedics;  Laterality: Left;  . TOTAL SHOULDER ARTHROPLASTY    . TUBAL LIGATION     Social History   Social History Narrative  . Not on file    Objective: Vital Signs: BP 132/81 (BP Location: Left Arm, Patient Position: Sitting, Cuff Size: Normal)   Pulse 61   Resp 12   Ht 5\' 3"  (1.6 m)   Wt 116 lb 3.2 oz (52.7 kg)   BMI 20.58 kg/m    Physical Exam  Constitutional: She is oriented to person, place, and time. She appears well-developed and well-nourished.  HENT:  Head: Normocephalic  and atraumatic.  Eyes: Conjunctivae and EOM are normal.  Neck: Normal range of motion.  Cardiovascular: Normal rate, regular rhythm, normal heart sounds and intact distal pulses.  Pulmonary/Chest: Effort normal and breath sounds normal.  Abdominal: Soft. Bowel sounds are normal.  Lymphadenopathy:    She has no cervical adenopathy.  Neurological: She is alert and oriented to person, place, and time.  Skin: Skin is warm and dry. Capillary refill takes less than 2 seconds.  Psychiatric: She has a normal mood and affect. Her behavior is normal.  Nursing note and vitals reviewed.    Musculoskeletal Exam: C-spine limited range of motion with left lateral rotation.  She had discomfort with range of motion of her lumbar spine.  Shoulder joints elbow joints wrist joints were in good range of motion.  She has tenderness on palpation of her right wrist.  She also has some synovial thickening and mild synovitis over MCPs and PIPs as described below.  Hip joints were in good range of motion.  Left hip has been replaced which had no discomfort.  She had good range of motion of her right hip joint.  She has tenderness on palpation over right trochanteric bursa.  Knee joints ankles MTPs PIPs been good range of motion.  CDAI Exam: CDAI Score: 8  Patient Global Assessment: 5 (mm); Provider Global Assessment: 5 (mm) Swollen: 3 ; Tender: 4  Joint Exam      Right  Left  Wrist   Tender     MCP 3  Swollen Tender     PIP 4  Swollen Tender  Swollen Tender     Investigation: No additional findings.  Imaging: Xr Hip Unilat W Or W/o Pelvis 2-3 Views Right  Result Date: 02/17/2018 No hip joint narrowing was noted.  No chondrocalcinosis was noted.  Left prosthesis appears in place. Impression: Unremarkable x-ray of the right hip joint.  Xr Lumbar Spine 2-3  Views  Result Date: 02/17/2018 Multilevel spondylosis was noted.  L4 L5-S1 fusion with screws and plates first noted.  Facet joint arthropathy was noted.   Calcification of aorta was noted. Impression: These findings are consistent with degenerative disc disease of lumbar spine is status post fusion and facet joint arthropathy.   Recent Labs: Lab Results  Component Value Date   WBC 6.8 11/30/2017   HGB 12.2 11/30/2017   PLT 263 11/30/2017   NA 134 (L) 11/30/2017   K 4.4 11/30/2017   CL 97 (L) 11/30/2017   CO2 30 11/30/2017   GLUCOSE 93 11/30/2017   BUN 14 11/30/2017   CREATININE 0.84 11/30/2017   BILITOT 0.6 11/30/2017   ALKPHOS 47 10/26/2017   AST 23 11/30/2017   ALT 13 11/30/2017   PROT 6.1 11/30/2017   ALBUMIN 4.3 10/26/2017   CALCIUM 9.0 11/30/2017   GFRAA 79 11/30/2017   QFTBGOLDPLUS NEGATIVE 08/31/2017    Speciality Comments: No specialty comments available.  Procedures:  Large Joint Inj: R greater trochanter on 02/17/2018 9:52 AM Indications: pain Details: 27 G 1.5 in needle, lateral approach  Arthrogram: No  Medications: 1.5 mL lidocaine 1 %; 40 mg triamcinolone acetonide 40 MG/ML Aspirate: 0 mL Outcome: tolerated well, no immediate complications Procedure, treatment alternatives, risks and benefits explained, specific risks discussed. Consent was given by the patient. Immediately prior to procedure a time out was called to verify the correct patient, procedure, equipment, support staff and site/side marked as required. Patient was prepped and draped in the usual sterile fashion.     Allergies: Darvon [propoxyphene hcl]; Meperidine hcl; Penicillins; Azathioprine; Leflunomide; and Ezetimibe-simvastatin   Assessment / Plan:     Visit Diagnoses: Rheumatoid arthritis involving multiple sites with positive rheumatoid factor (HCC)-she continues to have active disease despite being on Xeljanz methotrexate and prednisone.  Her main concern today is pain in her right hip which she describes over the trochanteric area.  High risk medication use - xeljanz 11 mg XR daily, MTX 6 tablets p.o. weekly, folic acid 1 mg p.o. daily,  prednisone 4 mg p.o. daily.  Labs are due in October and every 3 months to monitor for drug toxicity.  She is having difficult time getting [name] to her insurance.  We will try to get patient assistance possibility for written work..  Acute right-sided low back pain with right-sided sciatica - Plan: XR HIP UNILAT W OR W/O PELVIS 2-3 VIEWS RIGHT.  The x-ray of the right hip was unremarkable.  DDD lumbar-status post fusion  Pain in right hip - Plan: XR Lumbar Spine 2-3 Views The x-ray reveals multilevel spondylosis with fusion.  Trochanteric bursitis of right-per patient's request right trochanteric bursa was injected with cortisone as described above.  I have also referred her to physical therapy.  If she has an adequate response she is supposed to contact me.  Primary osteoarthritis of right hip-patient has very minimal osteoarthritis.  History of total hip replacement, left-doing well.  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-she does not have much C-spine discomfort.  She has limited lateral rotation on the left side.  Age-related osteoporosis without current pathological fracture: Patient had Reclast infusion in January 2017 and January 2019.  We applied for Tymlos and her co-pay is quite high.  She will be applying for patient assistance.  Patient had Prolia from 2013-/2016.  Her bone density has not changed much from t 2015 until 2017.  We will find out if patient had a bone density in 2019 if  not then we will schedule one.  History of multiple sclerosis-treated by neurologist.  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  Other fatigue-due to insomnia.  History of hyperlipidemia  History of anxiety  History of depression  History of hypertension   Orders: Orders Placed This Encounter  Procedures  . Large Joint Inj: R greater trochanter  . XR HIP UNILAT W OR W/O PELVIS 2-3 VIEWS RIGHT  . XR Lumbar Spine 2-3 Views   No orders of the defined types were placed in this  encounter.   Face-to-face time spent with patient was 30 minutes. Greater than 50% of time was spent in counseling and coordination of care.  Follow-Up Instructions: Return in about 3 months (around 05/19/2018) for Rheumatoid arthritis.   Bo Merino, MD  Note - This record has been created using Editor, commissioning.  Chart creation errors have been sought, but may not always  have been located. Such creation errors do not reflect on  the standard of medical care.

## 2018-02-15 NOTE — Telephone Encounter (Signed)
Last visit: 01/26/2018  Next visit: 04/28/2018 Labs: 11/30/17 Stable  Okay to refill per Dr. Estanislado Pandy

## 2018-02-16 NOTE — Telephone Encounter (Signed)
Received fax confirmation from [employer] that application was in review. Awaiting response.  Will send document to scan center  Phone# [phone]  9:27 AM Beatriz Chancellor, CPhT

## 2018-02-17 ENCOUNTER — Ambulatory Visit (INDEPENDENT_AMBULATORY_CARE_PROVIDER_SITE_OTHER): Payer: Self-pay

## 2018-02-17 ENCOUNTER — Encounter: Payer: Self-pay | Admitting: Rheumatology

## 2018-02-17 ENCOUNTER — Ambulatory Visit: Payer: Medicare Other | Admitting: Rheumatology

## 2018-02-17 VITALS — BP 132/81 | HR 61 | Resp 12 | Ht 63.0 in | Wt 116.2 lb

## 2018-02-17 DIAGNOSIS — F5101 Primary insomnia: Secondary | ICD-10-CM

## 2018-02-17 DIAGNOSIS — M5441 Lumbago with sciatica, right side: Secondary | ICD-10-CM

## 2018-02-17 DIAGNOSIS — R5383 Other fatigue: Secondary | ICD-10-CM

## 2018-02-17 DIAGNOSIS — M1611 Unilateral primary osteoarthritis, right hip: Secondary | ICD-10-CM | POA: Diagnosis not present

## 2018-02-17 DIAGNOSIS — M7061 Trochanteric bursitis, right hip: Secondary | ICD-10-CM

## 2018-02-17 DIAGNOSIS — Z79899 Other long term (current) drug therapy: Secondary | ICD-10-CM

## 2018-02-17 DIAGNOSIS — Z96642 Presence of left artificial hip joint: Secondary | ICD-10-CM

## 2018-02-17 DIAGNOSIS — Z8659 Personal history of other mental and behavioral disorders: Secondary | ICD-10-CM

## 2018-02-17 DIAGNOSIS — K219 Gastro-esophageal reflux disease without esophagitis: Secondary | ICD-10-CM | POA: Diagnosis not present

## 2018-02-17 DIAGNOSIS — M5136 Other intervertebral disc degeneration, lumbar region: Secondary | ICD-10-CM

## 2018-02-17 DIAGNOSIS — M25551 Pain in right hip: Secondary | ICD-10-CM | POA: Diagnosis not present

## 2018-02-17 DIAGNOSIS — M0579 Rheumatoid arthritis with rheumatoid factor of multiple sites without organ or systems involvement: Secondary | ICD-10-CM | POA: Diagnosis not present

## 2018-02-17 DIAGNOSIS — G35 Multiple sclerosis: Secondary | ICD-10-CM

## 2018-02-17 DIAGNOSIS — R1312 Dysphagia, oropharyngeal phase: Secondary | ICD-10-CM | POA: Diagnosis not present

## 2018-02-17 DIAGNOSIS — Z8669 Personal history of other diseases of the nervous system and sense organs: Secondary | ICD-10-CM

## 2018-02-17 DIAGNOSIS — R490 Dysphonia: Secondary | ICD-10-CM | POA: Diagnosis not present

## 2018-02-17 DIAGNOSIS — M810 Age-related osteoporosis without current pathological fracture: Secondary | ICD-10-CM

## 2018-02-17 DIAGNOSIS — H903 Sensorineural hearing loss, bilateral: Secondary | ICD-10-CM | POA: Diagnosis not present

## 2018-02-17 DIAGNOSIS — Z8639 Personal history of other endocrine, nutritional and metabolic disease: Secondary | ICD-10-CM

## 2018-02-17 DIAGNOSIS — M4312 Spondylolisthesis, cervical region: Secondary | ICD-10-CM

## 2018-02-17 DIAGNOSIS — Z8679 Personal history of other diseases of the circulatory system: Secondary | ICD-10-CM

## 2018-02-17 MED ORDER — TRIAMCINOLONE ACETONIDE 40 MG/ML IJ SUSP
40.0000 mg | INTRAMUSCULAR | Status: AC | PRN
  Administered 2018-02-17: 40 mg via INTRA_ARTICULAR

## 2018-02-17 MED ORDER — LIDOCAINE HCL 1 % IJ SOLN
1.5000 mL | INTRAMUSCULAR | Status: AC | PRN
  Administered 2018-02-17: 1.5 mL

## 2018-02-17 NOTE — Telephone Encounter (Signed)
Due to fax still not going through to [employer], mailed patient's income documents and copy of insurance card. Will follow up next week.  12:14 PM Beatriz Chancellor, CPhT

## 2018-02-17 NOTE — Patient Instructions (Signed)
Standing Labs We placed an order today for your standing lab work.   Please come back and get your standing labs in October and every 3 months   We have open lab Monday through Friday from 8:30-11:30 AM and 1:30-4:00 PM  at the office of Dr. Amia Rynders.   You may experience shorter wait times on Monday and Friday afternoons. The office is located at [address], [address], [place], [place] [address] No appointment is necessary.   Labs are drawn by [facility].  You may receive a bill from [facility] for your lab work. If you have any questions regarding directions or hours of operation,  please call [phone].    

## 2018-02-19 ENCOUNTER — Other Ambulatory Visit: Payer: Self-pay | Admitting: Pharmacist

## 2018-02-19 NOTE — Progress Notes (Unsigned)
Received fax from [place] Patient Assistance Program that the patient was DENIED for financial assistance for Tymlos.    Will proceed to look for grants to assist with Tymlos cost.  Will send document to scan center.

## 2018-02-22 ENCOUNTER — Telehealth: Payer: Self-pay | Admitting: Rheumatology

## 2018-02-22 ENCOUNTER — Telehealth: Payer: Self-pay | Admitting: Pharmacy Technician

## 2018-02-22 DIAGNOSIS — M6281 Muscle weakness (generalized): Secondary | ICD-10-CM | POA: Diagnosis not present

## 2018-02-22 DIAGNOSIS — M7061 Trochanteric bursitis, right hip: Secondary | ICD-10-CM | POA: Diagnosis not present

## 2018-02-22 DIAGNOSIS — M62838 Other muscle spasm: Secondary | ICD-10-CM | POA: Diagnosis not present

## 2018-02-22 NOTE — Telephone Encounter (Signed)
The plan is to start her on Renivoq. Patient is requesting a sample of Xeljanz. Is it okay to bring patient in and consent her on Renivoq and start on Renivoq with a sample? Please advise.

## 2018-02-22 NOTE — Telephone Encounter (Signed)
Patient was approved for a $1,000 grant for Post Menopausal Osteoporosis - Medicare Access. Authorization dates are from 01/23/18 to 02/23/19.  Will send documents to scan center once received.  BIN- [id number] PCN- [id number] GROUP- [id number]  2:25 PM Beatriz Chancellor, CPhT

## 2018-02-22 NOTE — Telephone Encounter (Signed)
Patient left a voicemail stating she needs samples of Xeljanz.  Patient requested a return call.

## 2018-02-23 ENCOUNTER — Encounter: Payer: Self-pay | Admitting: Internal Medicine

## 2018-02-23 ENCOUNTER — Telehealth: Payer: Self-pay | Admitting: Pharmacy Technician

## 2018-02-23 ENCOUNTER — Telehealth: Payer: Self-pay | Admitting: Pharmacist

## 2018-02-23 DIAGNOSIS — I83813 Varicose veins of bilateral lower extremities with pain: Secondary | ICD-10-CM | POA: Diagnosis not present

## 2018-02-23 DIAGNOSIS — R609 Edema, unspecified: Secondary | ICD-10-CM | POA: Diagnosis not present

## 2018-02-23 NOTE — Telephone Encounter (Signed)
Called [name], they have been reaching out to patient to get more income documents, but have been unsuccessful. Called patient, she will bring tax documents to office and pick up samples.  9:29 AM Sherri [name], CPhT

## 2018-02-23 NOTE — Telephone Encounter (Signed)
We can consent her on [name] after obtaining the consent.  Please schedule a visit with [employer] .  She may also start on Prolia after obtaining the consent as Tymlos was not covered.

## 2018-02-23 NOTE — Telephone Encounter (Signed)
Patient presented to the office to drop by some tax information for [name] patient assistance program.   Counseled patient that [name] is a JAK inhibitor.  Counseled patient on purpose, proper use, and adverse effects of [name].  Reviewed the most common adverse effects including infection, diarrhea, headaches.  Also reviewed rare adverse effects such as bowel injury and the need to contact us if develops stomach pain during treatment.  Reviewed with patient that there is the possibility of an increased risk of malignancy but it is not well understood if this increased risk is due to the medication or the disease state. Counseled on the increased risk of DVT/PE. Counseled patient to avoid live vaccines while on [name].  Provided patient with medication education material and answered all questions.  Patient consented to [name].  Will upload [name] into patient's chart.    Patient also inquired about Tymlos coverage.  Reminded patient that patient assistance for Tymlos was denied.  Our office was able to obtain a $1000 grant in order to help cover Prolia.  There was concern some concern about starting the Prolia as she was taken off the medication in the past due to a interaction with the [place].  Spoke with Dr. Estanislado Pandy about interaction.  This was something concerning when Prolia was first brought to the market based on studies but has been proven not to be applicable to practice.  Relayed the information to the patient and she is okay with proceeding with Prolia.  The plan is to restart Prolia at her next appointment.  She is aware of the lab work needed prior to Prolia injection.

## 2018-02-23 NOTE — Telephone Encounter (Signed)
Patient called with a question about [name] dosing.  She said her handout stated twice a day and I told her to take once a day.  Verified the handout she was referring to.  It was the rheuminfo.com patient handout for [name] which was given to her since there was not a handout for [name].  Instructed patient that the side effects and monitoring were the same for both.  Patient recalled that I mentioned that during the appointment.  Instructed patient to take 15 mg once daily.  Encouraged patient to call with any further questions.

## 2018-02-23 NOTE — Telephone Encounter (Signed)
Received notification from [employer], Prolia does not require Prior Authorization, it is on the plan's list of covered meds.  Phone# [phone]  Ran test claim- $285  1:45 PM Beatriz Chancellor, CPhT

## 2018-02-26 DIAGNOSIS — M6281 Muscle weakness (generalized): Secondary | ICD-10-CM | POA: Diagnosis not present

## 2018-02-26 DIAGNOSIS — M7061 Trochanteric bursitis, right hip: Secondary | ICD-10-CM | POA: Diagnosis not present

## 2018-03-01 ENCOUNTER — Other Ambulatory Visit: Payer: Self-pay | Admitting: Internal Medicine

## 2018-03-01 NOTE — Telephone Encounter (Signed)
Called to check status of [name] patient assistance application. Was advised tax document were received and determination will be made by the end of this week.  Phone- [phone]  11:57 AM Beatriz Chancellor, CPhT

## 2018-03-02 NOTE — Telephone Encounter (Signed)
We may give her [name] samples.  Please contact the pharmaceutical company and see if they have any other form of assistance for her.

## 2018-03-02 NOTE — Telephone Encounter (Signed)
Received Denial for Patient Assistance from [facility] Assist for [place]. Patient was denied due to not meeting eligibility requirements. Income was too high. Amber Rph will discuss with MD and patient on how to move forward.  Will send document to scan center.  Phone#[phone]  8:50 AM Beatriz Chancellor, CPhT

## 2018-03-03 ENCOUNTER — Other Ambulatory Visit: Payer: Self-pay

## 2018-03-03 DIAGNOSIS — Z79899 Other long term (current) drug therapy: Secondary | ICD-10-CM

## 2018-03-03 LAB — COMPLETE METABOLIC PANEL WITH GFR
AG Ratio: 1.7 (calc) (ref 1.0–2.5)
ALT: 20 U/L (ref 6–29)
AST: 27 U/L (ref 10–35)
Albumin: 4 g/dL (ref 3.6–5.1)
Alkaline phosphatase (APISO): 46 U/L (ref 33–130)
BUN/Creatinine Ratio: 20 (calc) (ref 6–22)
BUN: 19 mg/dL (ref 7–25)
CALCIUM: 9.6 mg/dL (ref 8.6–10.4)
CO2: 29 mmol/L (ref 20–32)
Chloride: 96 mmol/L — ABNORMAL LOW (ref 98–110)
Creat: 0.94 mg/dL — ABNORMAL HIGH (ref 0.60–0.93)
GFR, Est African American: 68 mL/min/{1.73_m2} (ref >=60)
GFR, Est Non African American: 59 mL/min/{1.73_m2} — ABNORMAL LOW (ref >=60)
GLUCOSE: 84 mg/dL (ref 65–99)
Globulin: 2.3 g/dL (calc) (ref 1.9–3.7)
POTASSIUM: 4.7 mmol/L (ref 3.5–5.3)
SODIUM: 133 mmol/L — AB (ref 135–146)
TOTAL PROTEIN: 6.3 g/dL (ref 6.1–8.1)
Total Bilirubin: 0.6 mg/dL (ref 0.2–1.2)

## 2018-03-03 LAB — CBC WITH DIFFERENTIAL/PLATELET
BASOS PCT: 0.5 %
Basophils Absolute: 31 cells/uL (ref 0–200)
Eosinophils Absolute: 12 cells/uL — ABNORMAL LOW (ref 15–500)
Eosinophils Relative: 0.2 %
HCT: 37.9 % (ref 35.0–45.0)
Hemoglobin: 13.3 g/dL (ref 11.7–15.5)
Lymphs Abs: 595 cells/uL — ABNORMAL LOW (ref 850–3900)
MCH: 34 pg — ABNORMAL HIGH (ref 27.0–33.0)
MCHC: 35.1 g/dL (ref 32.0–36.0)
MCV: 96.9 fL (ref 80.0–100.0)
MONOS PCT: 11.1 %
MPV: 9.5 fL (ref 7.5–12.5)
Neutro Abs: 4873 cells/uL (ref 1500–7800)
Neutrophils Relative %: 78.6 %
PLATELETS: 323 10*3/uL (ref 140–400)
RBC: 3.91 10*6/uL (ref 3.80–5.10)
RDW: 14.9 % (ref 11.0–15.0)
Total Lymphocyte: 9.6 %
WBC mixed population: 688 cells/uL (ref 200–950)
WBC: 6.2 10*3/uL (ref 3.8–10.8)

## 2018-03-03 NOTE — Telephone Encounter (Signed)
Patient stopped by for labs. Discussed status of [employer] Assist application for [name]. Provided patient the prescription out of pocket document to complete for redetermination. She will complete and return with prescription print outs.  2:08 PM Beatriz Chancellor, CPhT

## 2018-03-03 NOTE — Telephone Encounter (Signed)
Per [employer] Assist, there is no other assistance options for patient, due to income. Left message for Rep Lennette Bihari to see if we have any other options for her.

## 2018-03-04 DIAGNOSIS — M6281 Muscle weakness (generalized): Secondary | ICD-10-CM | POA: Diagnosis not present

## 2018-03-04 DIAGNOSIS — M7061 Trochanteric bursitis, right hip: Secondary | ICD-10-CM | POA: Diagnosis not present

## 2018-03-10 DIAGNOSIS — M6281 Muscle weakness (generalized): Secondary | ICD-10-CM | POA: Diagnosis not present

## 2018-03-10 DIAGNOSIS — M7061 Trochanteric bursitis, right hip: Secondary | ICD-10-CM | POA: Diagnosis not present

## 2018-03-10 DIAGNOSIS — M7918 Myalgia, other site: Secondary | ICD-10-CM | POA: Diagnosis not present

## 2018-03-15 NOTE — Telephone Encounter (Signed)
Called patient to check status of patient out of pocket documentation. No answer of voicemail, will follow up later in the week.  11:23 AM Beatriz Chancellor, CPhT

## 2018-03-17 ENCOUNTER — Telehealth: Payer: Self-pay | Admitting: Rheumatology

## 2018-03-17 NOTE — Telephone Encounter (Signed)
Patient advised she may stop by the office to pick up samples of [name]. Patient states she will stop by on 03/18/18 to pick them up.

## 2018-03-17 NOTE — Telephone Encounter (Signed)
Patient left a voicemail stating she only has enough samples of [name] to last through Friday 10/25.  Patient states she will need to pick up more samples if Dr. Estanislado Pandy wants her to continue taking the medication.

## 2018-03-18 ENCOUNTER — Telehealth: Payer: Self-pay | Admitting: Pharmacy Technician

## 2018-03-18 DIAGNOSIS — M0579 Rheumatoid arthritis with rheumatoid factor of multiple sites without organ or systems involvement: Secondary | ICD-10-CM

## 2018-03-18 DIAGNOSIS — M810 Age-related osteoporosis without current pathological fracture: Secondary | ICD-10-CM

## 2018-03-18 NOTE — Telephone Encounter (Signed)
Received a fax from [employer] regarding a prior authorization for Sherri [name]. Authorization has been APPROVED from 03/18/18 to 05/25/18.   Will send document to scan center.  Authorization # [id number] Phone # [phone]  Ran test claim- $1,364.85/ 1 month supply  3:49 PM Beatriz Chancellor, CPhT

## 2018-03-18 NOTE — Telephone Encounter (Signed)
Patient presents to office to pick up samples of [name].  She was given a 28-day supply.   Followed up about out-of-pocket medical expenses form in order to reapply for [name] patient assistance program.  Patient did not have form with her and states [facility] cannot give her the documentation she needs.  Reviewed medical expenses that could apply such as vitamins, over-the-counter products, physical therapy, medical devices.  Patient will try to gather information and fill out form to reapply.  Encouraged patient to speak with senior health insurance information program to review plan options to better cover her RA medications.  States her husband refuses to switch plan.  Encourage patient to set up an appointment to discuss other plan options with similar coverage and that they are not required to switch plans at that time.  Also informed patient that we will not be able to continue to supply samples on a monthly basis due to limited supply.  Discussed other medication options such as a Olumiant as it is in the same class as [name] and [name] but is half the price.  She also mentioned that her neurologist suggested Orencia.  Will run test claims for both medications in order to figure out pricing.    She is tolerating the [name] well without any noticeable side effects.  States that there is improvement in her hands and wrist but feet are still very painful.  Instructed patient to continue [place] at this time.  She also complains that physical therapy is not helping with her hip pain and believes she needs a hip replacement.  We will follow-up with Dr. Estanislado Pandy and discuss with patient at next appointment the beginning of December.

## 2018-03-18 NOTE — Telephone Encounter (Signed)
Received a Prior Authorization request from [employer] for [employer]. Authorization has been submitted to patient's insurance via Cover My Meds. Will update once we receive a response.  2:43 PM Beatriz Chancellor, CPhT

## 2018-03-18 NOTE — Telephone Encounter (Signed)
Received a Prior Authorization request from [employer] for [employer]. Authorization has been submitted to patient's insurance via Cover My Meds. Will update once we receive a response.  2:42 PM Beatriz Chancellor, CPhT

## 2018-03-19 DIAGNOSIS — M7918 Myalgia, other site: Secondary | ICD-10-CM | POA: Diagnosis not present

## 2018-03-19 DIAGNOSIS — M6281 Muscle weakness (generalized): Secondary | ICD-10-CM | POA: Diagnosis not present

## 2018-03-19 DIAGNOSIS — M7061 Trochanteric bursitis, right hip: Secondary | ICD-10-CM | POA: Diagnosis not present

## 2018-03-19 NOTE — Telephone Encounter (Signed)
Received fax from [employer] requesting more clinical information for the prior authorization.  Reviewed Epic and SRS for supporting information to complete request and faxed back to [employer].  Will send document to scan center and update when we receive a response.

## 2018-03-19 NOTE — Telephone Encounter (Signed)
Received a fax from [employer] regarding a prior authorization for Olumiant. Authorization has been APPROVED from 03/19/18 to 05/26/2019.   Will send document to scan center.  Authorization #[id number] Phone #[phone]  Will run test claim to determine co-pay.

## 2018-03-22 NOTE — Telephone Encounter (Signed)
Ran test claim, copay for 30 tablets for 30 days is $95.00

## 2018-03-22 NOTE — Telephone Encounter (Signed)
Patient presented to office to drop off out-of-pocket expense form for [name] patient assistance program.  Updated patient about test claims for Orencia and [place].  Maureen Chatters was approved but co-pay was $3000 per month.  Olumiant was approved and co-pay was $95.  Discussed medication options with patient.  She does not wish to continue to seek patient assistance for [name].  She wishes to proceed with Olumiant.  She just received 28-day supply of [name].  She is already open one bottle.  We will discuss with Dr. Estanislado Pandy for moving forward with the [place].  We will update patient when we receive a response.  She is eligible to fill prescription at [facility] long outpatient pharmacy.

## 2018-03-23 ENCOUNTER — Telehealth: Payer: Self-pay | Admitting: Rheumatology

## 2018-03-23 NOTE — Telephone Encounter (Signed)
Spoke to patient and updated expense form.

## 2018-03-23 NOTE — Telephone Encounter (Signed)
ok 

## 2018-03-23 NOTE — Telephone Encounter (Signed)
Patient left a voicemail stating she needs to add $1,006 to the list of expenses that she brought in.

## 2018-03-24 ENCOUNTER — Telehealth: Payer: Self-pay | Admitting: Pharmacy Technician

## 2018-03-24 DIAGNOSIS — M6281 Muscle weakness (generalized): Secondary | ICD-10-CM | POA: Diagnosis not present

## 2018-03-24 DIAGNOSIS — M6289 Other specified disorders of muscle: Secondary | ICD-10-CM | POA: Diagnosis not present

## 2018-03-24 DIAGNOSIS — R339 Retention of urine, unspecified: Secondary | ICD-10-CM | POA: Diagnosis not present

## 2018-03-24 DIAGNOSIS — M7061 Trochanteric bursitis, right hip: Secondary | ICD-10-CM | POA: Diagnosis not present

## 2018-03-24 DIAGNOSIS — M62838 Other muscle spasm: Secondary | ICD-10-CM | POA: Diagnosis not present

## 2018-03-24 MED ORDER — BARICITINIB 2 MG PO TABS
2.0000 mg | ORAL_TABLET | Freq: Every day | ORAL | 2 refills | Status: DC
Start: 2018-03-24 — End: 2018-08-16

## 2018-03-24 MED ORDER — DENOSUMAB 60 MG/ML ~~LOC~~ SOSY: 60.0000 mg | PREFILLED_SYRINGE | SUBCUTANEOUS | 0 refills | Status: DC

## 2018-03-24 NOTE — Addendum Note (Signed)
Addended by: Mariella Saa C on: 03/24/2018 08:53 AM   Modules accepted: Orders

## 2018-03-24 NOTE — Telephone Encounter (Signed)
Prescription for Olumiant and Prolia sent to [facility].  Will coordinate with patient and pharmacy for delivery.  Prolia injection due at next office visit on 04/28/18.  GFR borderline for dose adjustment of Olumiant.  Patient was taking NSAIDs and instructed to minimize use.  Will continue to monitor and if kidney function does not improve, a dose reduction will be needed.

## 2018-03-24 NOTE — Telephone Encounter (Signed)
Called patient to schedule 1st shipment with [facility] for Olumiant. No answer or voicemail.  11:31 AM Beatriz Chancellor, CPhT

## 2018-03-31 ENCOUNTER — Telehealth: Payer: Self-pay | Admitting: Pharmacy Technician

## 2018-03-31 DIAGNOSIS — M6281 Muscle weakness (generalized): Secondary | ICD-10-CM | POA: Diagnosis not present

## 2018-03-31 DIAGNOSIS — M7061 Trochanteric bursitis, right hip: Secondary | ICD-10-CM | POA: Diagnosis not present

## 2018-03-31 DIAGNOSIS — M62838 Other muscle spasm: Secondary | ICD-10-CM | POA: Diagnosis not present

## 2018-03-31 DIAGNOSIS — M6289 Other specified disorders of muscle: Secondary | ICD-10-CM | POA: Diagnosis not present

## 2018-03-31 NOTE — Telephone Encounter (Signed)
Scheduled 1st shipment of Olumiant for patient to deliver- 04/07/18.

## 2018-03-31 NOTE — Telephone Encounter (Signed)
Patient advised that Dr. Estanislado Pandy recommends a stationary bike to prevent fall and fracture.

## 2018-03-31 NOTE — Telephone Encounter (Signed)
Patient would like to know, based off of her last bone scans, if you think she could start back riding her bicycle. She says she really would like to start riding again. She said she plans to wear all necessary protective gear. Patient is requesting a call back.  10:42 AM Beatriz Chancellor, CPhT

## 2018-03-31 NOTE — Telephone Encounter (Signed)
Please see note below. 

## 2018-03-31 NOTE — Telephone Encounter (Signed)
I would recommend that patient should use a stationary bike to prevent fall and fracture.

## 2018-04-06 ENCOUNTER — Other Ambulatory Visit: Payer: Self-pay | Admitting: Internal Medicine

## 2018-04-06 ENCOUNTER — Other Ambulatory Visit: Payer: Self-pay | Admitting: Physician Assistant

## 2018-04-06 DIAGNOSIS — D485 Neoplasm of uncertain behavior of skin: Secondary | ICD-10-CM | POA: Diagnosis not present

## 2018-04-06 DIAGNOSIS — D0472 Carcinoma in situ of skin of left lower limb, including hip: Secondary | ICD-10-CM | POA: Diagnosis not present

## 2018-04-06 DIAGNOSIS — L570 Actinic keratosis: Secondary | ICD-10-CM | POA: Diagnosis not present

## 2018-04-06 MED FILL — OLUMIANT 2 MG TABS: 2 | 30 days supply | Qty: 30 | Fill #0

## 2018-04-07 DIAGNOSIS — M6281 Muscle weakness (generalized): Secondary | ICD-10-CM | POA: Diagnosis not present

## 2018-04-07 DIAGNOSIS — M6289 Other specified disorders of muscle: Secondary | ICD-10-CM | POA: Diagnosis not present

## 2018-04-07 DIAGNOSIS — M7061 Trochanteric bursitis, right hip: Secondary | ICD-10-CM | POA: Diagnosis not present

## 2018-04-07 DIAGNOSIS — M62838 Other muscle spasm: Secondary | ICD-10-CM | POA: Diagnosis not present

## 2018-04-08 ENCOUNTER — Other Ambulatory Visit: Payer: Self-pay | Admitting: Internal Medicine

## 2018-04-08 ENCOUNTER — Other Ambulatory Visit: Payer: Self-pay | Admitting: Rheumatology

## 2018-04-09 NOTE — Telephone Encounter (Signed)
Last Visit: 02/17/18 Next Visit: 04/28/18  Okay to refill per Dr. Estanislado Pandy

## 2018-04-12 ENCOUNTER — Telehealth: Payer: Self-pay | Admitting: Pharmacy Technician

## 2018-04-12 DIAGNOSIS — Z79899 Other long term (current) drug therapy: Secondary | ICD-10-CM

## 2018-04-12 DIAGNOSIS — M810 Age-related osteoporosis without current pathological fracture: Secondary | ICD-10-CM

## 2018-04-12 NOTE — Telephone Encounter (Signed)
Future order for CBC/CMP placed.

## 2018-04-12 NOTE — Telephone Encounter (Signed)
Spoke to patient about Prolia injection at her 04/28/18 appointment. Patient will come in on 04/19/18 to have labs drawn. Can you please pend her lab orders for Prolia?

## 2018-04-13 DIAGNOSIS — M6289 Other specified disorders of muscle: Secondary | ICD-10-CM | POA: Diagnosis not present

## 2018-04-13 DIAGNOSIS — M6281 Muscle weakness (generalized): Secondary | ICD-10-CM | POA: Diagnosis not present

## 2018-04-13 DIAGNOSIS — M62838 Other muscle spasm: Secondary | ICD-10-CM | POA: Diagnosis not present

## 2018-04-13 DIAGNOSIS — M2550 Pain in unspecified joint: Secondary | ICD-10-CM | POA: Diagnosis not present

## 2018-04-14 ENCOUNTER — Ambulatory Visit: Payer: Medicare Other | Admitting: Neurology

## 2018-04-14 ENCOUNTER — Other Ambulatory Visit: Payer: Self-pay | Admitting: Internal Medicine

## 2018-04-14 ENCOUNTER — Encounter: Payer: Self-pay | Admitting: Neurology

## 2018-04-14 VITALS — BP 133/83 | HR 76 | Ht 63.0 in | Wt 115.5 lb

## 2018-04-14 DIAGNOSIS — M0579 Rheumatoid arthritis with rheumatoid factor of multiple sites without organ or systems involvement: Secondary | ICD-10-CM

## 2018-04-14 DIAGNOSIS — G35 Multiple sclerosis: Secondary | ICD-10-CM | POA: Diagnosis not present

## 2018-04-14 DIAGNOSIS — R269 Unspecified abnormalities of gait and mobility: Secondary | ICD-10-CM | POA: Diagnosis not present

## 2018-04-14 DIAGNOSIS — M7061 Trochanteric bursitis, right hip: Secondary | ICD-10-CM

## 2018-04-14 DIAGNOSIS — M5431 Sciatica, right side: Secondary | ICD-10-CM | POA: Insufficient documentation

## 2018-04-14 MED ORDER — DIAZEPAM 5 MG PO TABS: 5.0000 mg | ORAL_TABLET | Freq: Every day | ORAL | 5 refills | Status: DC

## 2018-04-14 MED ORDER — TRAMADOL HCL 50 MG PO TABS: ORAL_TABLET | ORAL | 3 refills | Status: DC

## 2018-04-14 NOTE — Progress Notes (Signed)
[facility] at [phone]. Received fax confirmation.

## 2018-04-14 NOTE — Progress Notes (Deleted)
Office Visit Note  Patient: Sherri [name]             Date of Birth: [birth year]           MRN: [id number]             PCP: Binnie Rail, MD Referring: Binnie Rail, MD Visit Date: 04/28/2018 Occupation: @GUAROCC @  Subjective:  No chief complaint on file.  Need consent for Olumiant.  Prolia injection today.  Current regimen includes Olumiant 2 mg daily started on 04/19/2018.  Last TB gold negative on 08/31/2017.  Last lipid panel on within normal limits 04/24/2017.  She is on Lipitor 10 mg which is managed by PCP Dr. Quay Burow.  Most recent CBC/CMP within normal limits on 04/19/2018.  Due for CBC/CMP at the end of December to monitor for drug toxicity after initiation and then every 3 months.  Standing orders are in place.  Recommend Shingrix vaccine.  X-ray of bilateral hands, feet, ankles taken on 05/01/2017.  Patient will have her first Prolia injection in office today.  Prior therapy includes 2 infusions of Reclast and previously received Prolia.  She was unable to afford Tymlos or Forteo.  Most recent DEXA on 01/11/2018 T score of -3.4 of femoral neck and T score -3.9 of radius.  Previous DEXA on 10/18/2015 showed T score of femoral neck -2.9 and T score of radius -3.3.  History of Present Illness: Sherri [name] is a 77 y.o. female  with history of rheumatoid arthritis, osteoarthritis, and degenerative disc disease.   Activities of Daily Living:  Patient reports morning stiffness for *** {minute/hour:19697}.   Patient {ACTIONS;DENIES/REPORTS:21021675::"Denies"} nocturnal pain.  Difficulty dressing/grooming: {ACTIONS;DENIES/REPORTS:21021675::"Denies"} Difficulty climbing stairs: {ACTIONS;DENIES/REPORTS:21021675::"Denies"} Difficulty getting out of chair: {ACTIONS;DENIES/REPORTS:21021675::"Denies"} Difficulty using hands for taps, buttons, cutlery, and/or writing: {ACTIONS;DENIES/REPORTS:21021675::"Denies"}  No Rheumatology ROS completed.   PMFS History:  Patient Active Problem  List   Diagnosis Date Noted  . S/P cervical spinal fusion 11/10/2017  . Swelling of left foot 10/26/2017  . Dysphagia 10/26/2017  . Left ankle swelling 10/26/2017  . Localized swelling, mass, and lump of head 08/18/2017  . Gastroesophageal reflux disease 08/06/2017  . Easy bruising 08/06/2017  . Anxiety 04/24/2017  . Mid back pain 03/26/2017  . Left sided chest pain 03/25/2017  . Dyspnea on exertion 03/14/2017  . Hyperkalemia 03/13/2017  . Rash and nonspecific skin eruption 01/08/2017  . Submandibular gland swelling 01/08/2017  . High risk medication use 09/02/2016  . Urinary frequency 09/02/2016  . Piriformis muscle pain 09/02/2016  . Fatigue 07/22/2016  . Hair loss 07/22/2016  . Right hip pain 04/08/2016  . Right knee pain 02/25/2016  . Dyspepsia 04/05/2015  . Numbness 11/14/2014  . Biceps tendon tear 10/10/2014  . Insomnia 10/10/2014  . Gait disturbance 06/13/2014  . 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 OP (osteoporosis) 01/18/2014  . Other long term (current) drug therapy 01/18/2014  . Other dysphagia 12/29/2013  . Hoarseness 12/29/2013  . 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 Personal history of colonic polyps 07/03/2010  . Nocturia 05/28/2010  . THYROID NODULE 05/17/2008  . Multiple sclerosis ([place]) 05/17/2008  . Chronic fatigue 03/30/2007  . Hyperlipidemia 08/20/2006  . Essential hypertension 08/20/2006  . Rheumatoid arthritis ([place]) 08/20/2006  . CERVICAL CANCER, HX OF 08/20/2006  . GASTRIC ULCER, HX OF 08/20/2006    Past Medical History:  Diagnosis Date  . Anxiety    takes Valium daily as needed  . Bruises easily    d/t  meds  . Cancer ([place])    basal cell ca, in situ- uterine   . Cataracts, bilateral   . Chronic back pain   . Dizziness    r/t to meds  . GERD (gastroesophageal reflux disease)    no meds on a regular basis but will take Tums if needed  . Headache(784.0)    r/t neck issues  . History of bronchitis 6-52yrs ago  .  History of colon polyps    benign  . Hyperlipidemia    takes Atorvastatin on Mondays and Fridays  . Hypertension    takes Ramipril daily  . Joint pain   . Joint swelling   . Multiple sclerosis ([place])   . Neuromuscular disorder ([place])    Dr. Park Liter- [facility], follows [facility]  . Nocturia   . OA (osteoarthritis)    Dr Jani Gravel weekly, RA- hands- knees- feet   . PONV (postoperative nausea and vomiting)    trouble urinating after surgery in 2014  . Rheumatoid arthritis(714.0)    Dr Estanislado Pandy takes [name] daily  . Skin cancer   . Urinary retention    sees Dr.Wrenn about 2 times a yr    Family History  Problem Relation Age of Onset  . Cancer Mother        pancreatic  . Diabetes Mother   . Heart disease Father        Rheumatic  . Cancer Father        ? stomach  . Cancer Sister        stomach  . Cancer Maternal Aunt        X 4; ? primary  . Heart disease Paternal Aunt   . Cancer Maternal Grandmother        cervical  . Colon cancer Unknown        Aunts  . Multiple sclerosis Daughter   . Hypertension Neg Hx   . Stroke Neg Hx    Past Surgical History:  Procedure Laterality Date  . ABDOMINAL HYSTERECTOMY     1985  . APPENDECTOMY     with TAH  . BACK SURGERY     several  . BREAST BIOPSY Right    Results were negative  . BROW LIFT Bilateral 10/02/2016   Procedure: BILATERAL LOWER LID BLEPHAROPLASTY;  Surgeon: Clista Bernhardt, MD;  Location: [place];  Service: Plastics;  Laterality: Bilateral;  . CARPAL TUNNEL RELEASE Right   . cataracts    . CERVICAL FUSION     Dr Joya Salm  . CERVICAL FUSION  12/31/2012   Dr Joya Salm  . CHEST TUBE INSERTION     for traumatic Pneumothorax  . COLONOSCOPY  07/16/2010   normal   . COLONOSCOPY W/ POLYPECTOMY  1997   negative since; Dr Deatra Ina  . ESOPHAGOGASTRODUODENOSCOPY  07/16/2010   normal  . eye lid raise    . EYE SURGERY Bilateral    cataracts removed - /w IOL  . JOINT REPLACEMENT    . LUMBAR FUSION     Dr Joya Salm   . NASAL SINUS SURGERY    . POSTERIOR CERVICAL FUSION/FORAMINOTOMY N/A 12/31/2012   Procedure: POSTERIOR LATERAL CERVICAL FUSION/FORAMINOTOMY LEVEL 1 CERVICAL THREE-FOUR WITH LATERAL MASS SCREWS;  Surgeon: Floyce Stakes, MD;  Location: [place] [facility];  Service: Neurosurgery;  Laterality: N/A;  . PTOSIS REPAIR Bilateral 10/02/2016   Procedure: INTERNAL PTOSIS REPAIR;  Surgeon: Clista Bernhardt, MD;  Location: [place];  Service: Plastics;  Laterality: Bilateral;  . SKIN BIOPSY    .  THYROID SURGERY     R lobe removed- 1972, has grown back - CT last done- 2018  . TOTAL HIP ARTHROPLASTY  12/22/2011   Procedure: TOTAL HIP ARTHROPLASTY;  Surgeon: Kerin Salen, MD;  Location: [place];  Service: Orthopedics;  Laterality: Left;  . TOTAL SHOULDER ARTHROPLASTY    . TUBAL LIGATION     Social History   Social History Narrative  . Not on file    Objective: Vital Signs: There were no vitals taken for this visit.   Physical Exam   Musculoskeletal Exam: ***  CDAI Exam: CDAI Score: Not documented Patient Global Assessment: Not documented; Provider Global Assessment: Not documented Swollen: Not documented; Tender: Not documented Joint Exam   Not documented   There is currently no information documented on the homunculus. Go to the Rheumatology activity and complete the homunculus joint exam.  Investigation: No additional findings.  Imaging: No results found.  Recent Labs: Lab Results  Component Value Date   WBC 6.2 03/03/2018   HGB 13.3 03/03/2018   PLT 323 03/03/2018   NA 133 (L) 03/03/2018   K 4.7 03/03/2018   CL 96 (L) 03/03/2018   CO2 29 03/03/2018   GLUCOSE 84 03/03/2018   BUN 19 03/03/2018   CREATININE 0.94 (H) 03/03/2018   BILITOT 0.6 03/03/2018   ALKPHOS 47 10/26/2017   AST 27 03/03/2018   ALT 20 03/03/2018   PROT 6.3 03/03/2018   ALBUMIN 4.3 10/26/2017   CALCIUM 9.6 03/03/2018   GFRAA 68 03/03/2018   QFTBGOLDPLUS NEGATIVE 08/31/2017    Speciality Comments: No  specialty comments available.  Procedures:  No procedures performed Allergies: Darvon [propoxyphene hcl]; Meperidine hcl; Penicillins; Azathioprine; Leflunomide; and Ezetimibe-simvastatin   Assessment / Plan:     Visit Diagnoses: Rheumatoid arthritis involving multiple sites with positive rheumatoid factor ([place])  High risk medication use - Oluminant  Age-related osteoporosis without current pathological fracture - ProliaReclast Jan 2017, 2019Prolia 2013-2016  Primary osteoarthritis of right hip  History of total hip replacement, left  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  DDD (degenerative disc disease), lumbar  History of multiple sclerosis  Primary insomnia  Other fatigue  History of hyperlipidemia  History of anxiety  History of depression  History of hypertension   Orders: No orders of the defined types were placed in this encounter.  No orders of the defined types were placed in this encounter.   Face-to-face time spent with patient was *** minutes. Greater than 50% of time was spent in counseling and coordination of care.  Follow-Up Instructions: No follow-ups on file.   Ofilia Neas, PA-C  Note - This record has been created using Dragon software.  Chart creation errors have been sought, but may not always  have been located. Such creation errors do not reflect on  the standard of medical care.

## 2018-04-14 NOTE — Progress Notes (Signed)
[facility]  PATIENT: Sherri [name] DOB: Apr 28, 1941  REFERRING CLINICIAN: Unice [name]  HISTORY FROM: patient REASON FOR VISIT: Multiple sclerosis   HISTORICAL  CHIEF COMPLAINT:  Chief Complaint  Patient presents with  . Follow-up    RM 13, alone.  Last seen 11/10/17  . Multiple Sclerosis    Not on a DMT. She stopped gabapentin. Felt she did not need it. Does well on diazepam.   . Rheumatoid Arthritis     Methotrexate and Prednisone. Taking [name] for the last 2 months but costing 8000 dollars per month via insurance. Has improved sx. She will switch to olumiant starting 04/19/18.  Sherri [name]    Broke her right foot from a fall. This happened back in January.   . Gait Problem    Pt reports she sways a lot when she walks but denies being unsteady. Been going to PT for the last two months. Pain in right hip/leg and both feet. Pain in feet has improved some.     HISTORY OF PRESENT ILLNESS:  Sherri [name] is a 77 year old woman who was diagnosed with relapsing remitting MS in 2010.     Update 04/14/2018: She is being switched from Sherri [name] to [name] (Upadacitinib) and then to  Olumiant (baricitinib).   [name] was not covered by insurance.   She has been told she had bursitis in the right hip    Pain is in the buttock to the lateral thigh and proximal knee.   Due to foot pain she feels her gait is poor.   Pain is in the ball of the foot and the toes.   The MS has ben mostly stable.   She did not tolerate Aubagio due to hair loss and stopped.    She is currently off a DMT.    Her gait is worse mostly due to more pain.   However, balance seems worse.  She denies numbness.    Bladder function is unchanged.       She had one episode of syncope while defecating and when she fell, she broke bones in the right foot  Update 11/10/2017: She feels her MS has been stable.  She is off of any MS DMT but is on Xeljanz and Methotrexate for RA.    She is on prednisone 5 mg (10 mg  for flares) daily.   Pain has been much worse on attempts to go off prednisone.    Her MS is mild so I'm fine with her off a DMT.     Aubagio caused a lot of hair loss.    We discussed avoiding Enbrel/Remicade/Humira/Simponi  She was found to have C7, C8 and T1 radiculopathy on recent NCV/EMG.   She has fusion from C3 to C7.   She has a cervical spine MRI later this week.    Cervical spine 3 or 4 years ago showed fusion from C3-C7.  There is possible pseudoarthrosis at C6-C7 and minimal spondylolisthesis at C7-T1.  There is  She has had some dysphagia but it is felt possibly due to an enlarged thyroid (felt due to cysts on Bx)     Update 06/09/2017: She feels the MS is stable.   No new weakness or numbness and gait is unchanged. She remains on methotrexate and Xeljanz for her rheumatoid arthritis. She is off of her MS disease modifying therapy. She did not tolerate leflunomide due to hair loss.  She reports more hip pain that radiates into her leg all the way  to the side of the foot..   A recent hip injection by Sherri [name] did not help.   Of note she has had L4L5 and L5S1 fusion by Dr. Joya [name] in the past.     Her gabapentin dose is currently 100 in am and 100 mg at night.   Sherri [name] had trouble tolerating 200 mg in the morning and 300 mg at night.   Gabapentin did help her RLS and she tolerates it well.   It does not help her pain..      From 10/30/2016: MS:   She feels her MS is stable.    She also has RA and is on Xeljanz and prednisone.    She is not on any DMT for her MS.     She denies any recent exacerbation.     Her right leg sometimes gives out on her but the changes have been gradual.     Vision:    She had recent surgery for ptosis (10/02/16) after she had cataract surgery (still had reduced peripheral vision after initil surgery when she noted the problem more).      RA:   She remains on Xeljanz and methotrexate.   MTX dose reduced as higher does may have affected renal function and dose  was reduced.     Gait/strength:    She feels gait is worse and she notes dragging her right foot more, even for short distances.    Sherri [name] also notes more cramps in the right leg.   The right leg also sometimes gives out randomly.  Gait also affected by right hip pain and she also had left THR in the past.      She feels gait is not bad enough to use a cane but does feel off balanced.    The legs (R>L) also ache a lot more.  Tramadol during the day and oxycodone and valium at night help the pain a lot.   Leg cramps are worse when she lays down.    Sensation:   She notes mild foot numbness bilaterally which is stable.     She has stable left facial numbness  in V2 and V3 > V1.   This was her first symptom (tongue was numb many years before diagnosis).    No arm numbness.     Bladder:   She has urinary frequency and hesitancy.   She does not completely empty (has PVR's at urology).    She has no recent UTI's,       Fatigue/sleep:     Fatigue is mild most days but worse a day after 'overdoing it'.    She is sleeping well with Valium 5 mg and Oxycodone 10 mg at bedtime .   Sherri [name] sleeps better with her pain under better control.    She is active, walking daily.    Mood/cognition:     Mood is doing well.    She does not note that she has any significant cognitive changes.       MS History:   When she was in her late 60s she had an episode of right-sided numbness and she lost the use of her right arm for a couple of weeks. At that time, she was told that she might have MS. Over the next 30 or 40 years she had several more similar but milder episodes. In 2010, she had an MRI of the cervical spine (for arm pain) and it showed foci c/w multiple sclerosis. An MRI  of the brain was also performed  shortly thereafter showing white matter lesions consistent with the diagnosis.  Initially, she was placed on Avonex. She also has rheumatoid arthritis and was placed on methotrexate and then a combination of methotrexate with  Sherri [name]. We decided to stop the Avonex as she probably would get some benefit from those medications for the MS as well. For the most part her MS has been stable over the past 5 years.However, early 2016,  she had numbness in the left face and weakness in her legs   REVIEW OF SYSTEMS:  Constitutional: No fevers, chills, sweats, or change in appetite Eyes: No visual changes, double vision, eye pain Ear, nose and throat: No hearing loss, ear pain, nasal congestion, sore throat Cardiovascular: No chest pain, palpitations Respiratory:  No shortness of breath at rest or with exertion.   No wheezes GastrointestinaI: No nausea, vomiting, diarrhea, abdominal pain, fecal incontinence Genitourinary:  as above. Musculoskeletal:  No neck pain, back pain Integumentary: No rash, pruritus, skin lesions Neurological: as above Psychiatric: Sherri [name] has had a lot of family stress leading to some depression and more anxiety. Endocrine: No palpitations, diaphoresis, change in appetite, change in weigh or increased thirst Hematologic/Lymphatic:  No anemia, purpura, petechiae. Allergic/Immunologic: No itchy/runny eyes, nasal congestion, recent allergic reactions, rashes  ALLERGIES: Allergies  Allergen Reactions  . Darvon [Propoxyphene Hcl] Shortness Of Breath    ? Dose Related ? Lowered respirations greatly  . Meperidine Hcl Shortness Of Breath  . Penicillins Hives and Other (See Comments)    Has patient had a PCN reaction causing immediate rash, facial/tongue/throat swelling, SOB or lightheadedness with hypotension: No SEVERE RASH INVOLVING MUCUS MEMBRANES or SKIN NECROSIS: #  #  #  YES  #  #  #  Has patient had a PCN reaction that required hospitalization No Has patient had a PCN reaction occurring within the last 10 years: No.   . Azathioprine Other (See Comments)    Weakness  . Leflunomide     unknown  . Ezetimibe-Simvastatin Nausea And Vomiting    HOME MEDICATIONS:   PAST MEDICAL HISTORY: Past  Medical History:  Diagnosis Date  . Anxiety    takes Valium daily as needed  . Bruises easily    d/t meds  . Cancer ([place])    basal cell ca, in situ- uterine   . Cataracts, bilateral   . Chronic back pain   . Dizziness    r/t to meds  . GERD (gastroesophageal reflux disease)    no meds on a regular basis but will take Tums if needed  . Headache(784.0)    r/t neck issues  . History of bronchitis 6-8yrs ago  . History of colon polyps    benign  . Hyperlipidemia    takes Atorvastatin on Mondays and Fridays  . Hypertension    takes Ramipril daily  . Joint pain   . Joint swelling   . Multiple sclerosis ([place])   . Neuromuscular disorder ([place])    Dr. Park Liter- [facility], follows M.S.  . Nocturia   . OA (osteoarthritis)    Dr Jani Gravel weekly, RA- hands- knees- feet   . PONV (postoperative nausea and vomiting)    trouble urinating after surgery in 2014  . Rheumatoid arthritis(714.0)    Dr Estanislado [name] takes Sherri [name] daily  . Skin cancer   . Urinary retention    sees SherriWrenn about 2 times a yr    PAST SURGICAL HISTORY: Past Surgical History:  Procedure Laterality  Date  . ABDOMINAL HYSTERECTOMY     1985  . APPENDECTOMY     with TAH  . BACK SURGERY     several  . BREAST BIOPSY Right    Results were negative  . BROW LIFT Bilateral 10/02/2016   Procedure: BILATERAL LOWER LID BLEPHAROPLASTY;  Surgeon: Clista Bernhardt, MD;  Location: [name];  Service: Plastics;  Laterality: Bilateral;  . CARPAL TUNNEL RELEASE Right   . cataracts    . CERVICAL FUSION     Dr Sherri [name]  . CERVICAL FUSION  12/31/2012   Dr Sherri [name]  . CHEST TUBE INSERTION     for traumatic Pneumothorax  . COLONOSCOPY  07/16/2010   normal   . COLONOSCOPY W/ POLYPECTOMY  1997   negative since; Dr Deatra Ina  . ESOPHAGOGASTRODUODENOSCOPY  07/16/2010   normal  . eye lid raise    . EYE SURGERY Bilateral    cataracts removed - /w IOL  . JOINT REPLACEMENT    . LUMBAR FUSION     Dr Sherri [name]  . NASAL  SINUS SURGERY    . POSTERIOR CERVICAL FUSION/FORAMINOTOMY N/A 12/31/2012   Procedure: POSTERIOR LATERAL CERVICAL FUSION/FORAMINOTOMY LEVEL 1 CERVICAL THREE-FOUR WITH LATERAL MASS SCREWS;  Surgeon: Floyce Stakes, MD;  Location: [facility];  Service: Neurosurgery;  Laterality: N/A;  . PTOSIS REPAIR Bilateral 10/02/2016   Procedure: INTERNAL PTOSIS REPAIR;  Surgeon: Clista Bernhardt, MD;  Location: [facility];  Service: Plastics;  Laterality: Bilateral;  . SKIN BIOPSY    . THYROID SURGERY     R lobe removed- 1972, has grown back - CT last done- 2018  . TOTAL HIP ARTHROPLASTY  12/22/2011   Procedure: TOTAL HIP ARTHROPLASTY;  Surgeon: Kerin Salen, MD;  Location: [facility];  Service: Orthopedics;  Laterality: Left;  . TOTAL SHOULDER ARTHROPLASTY    . TUBAL LIGATION      FAMILY HISTORY: Family History  Problem Relation Age of Onset  . Cancer Mother        pancreatic  . Diabetes Mother   . Heart disease Father        Rheumatic  . Cancer Father        ? stomach  . Cancer Sister        stomach  . Cancer Maternal Aunt        X 4; ? primary  . Heart disease Paternal Aunt   . Cancer Maternal Grandmother        cervical  . Colon cancer Unknown        Aunts  . Multiple sclerosis Daughter   . Hypertension Neg Hx   . Stroke Neg Hx     SOCIAL HISTORY:  Social History   Socioeconomic History  . Marital status: Married    Spouse name: Not on file  . Number of children: Not on file  . Years of education: Not on file  . Highest education level: Not on file  Occupational History  . Occupation: retired  Scientific laboratory technician  . Financial resource strain: Not on file  . Food insecurity:    Worry: Not on file    Inability: Not on file  . Transportation needs:    Medical: Not on file    Non-medical: Not on file  Tobacco Use  . Smoking status: Former Smoker    Packs/day: 2.50    Years: 20.00    Pack years: 50.00    Types: Cigarettes    Last attempt to quit: 05/26/1981  Years since quitting:  36.9  . Smokeless tobacco: Never Used  . Tobacco comment: Smoked [phone], up to 2.5 ppd  Substance and Sexual Activity  . Alcohol use: Yes    Alcohol/week: 14.0 standard drinks    Types: 14 Cans of beer per week    Comment: 4% beer  . Drug use: Never  . Sexual activity: Yes  Lifestyle  . Physical activity:    Days per week: Not on file    Minutes per session: Not on file  . Stress: Not on file  Relationships  . Social connections:    Talks on phone: Not on file    Gets together: Not on file    Attends religious service: Not on file    Active member of club or organization: Not on file    Attends meetings of clubs or organizations: Not on file    Relationship status: Not on file  . Intimate partner violence:    Fear of current or ex partner: Not on file    Emotionally abused: Not on file    Physically abused: Not on file    Forced sexual activity: Not on file  Other Topics Concern  . Not on file  Social History Narrative  . Not on file     PHYSICAL EXAM  Vitals:   04/14/18 1433  BP: 133/83  Pulse: 76  Weight: 115 lb 8 oz (52.4 kg)  Height: 5\' 3"  (1.6 m)    Body mass index is 20.46 kg/m.   General: The patient is well-developed and well-nourished and in no acute distress  Musculoskeletal:   There is mild lower paraspinal tenderness, mild to moderate right piriformis tendernes and moderately severe right trochanteric bursa tenderness.   Mildly positive Faber sign.    Skin: Extremities are without rash or edema.  Neurologic Exam  Mental status: The patient is alert and oriented x 3 at the time of the examination. The patient has apparent normal recent and remote memory, with an apparently normal attention span and concentration ability.   Speech is normal.  CN:  EOMI, Intact facial sensation..  Facial strength is normal.  Trapezius strength is normal..  Palatal elevation is normal and Tongue protrusion midline.    No obvious hearing deficits are  noted.  Motor:  Right biceps bulges (she has a history of rupture).   Tone is mildly increased in her legs, left > right. Strength appears 5 / 5 in all 4 extremities except 4/5 ulnar innervated intrinsic hand muscles on the right.     Sensory: Sensory testing is intact to touch and vibration in the arms and legs.  Coordination: Cerebellar testing reveals good rapid alternating movements in hands . Reduced heel to shin, worse on the right.     Gait and station: Station is stable.  The gait is arthritic.  It is mildly wide.. She has a mild right foot drop. The tandem gait is wide.   Negative Romberg  Reflexes: Deep tendon reflexes are normal and symmetric.    DIAGNOSTIC DATA (LABS, IMAGING, TESTING) - I reviewed patient records, labs, notes, testing and imaging myself where available.  Lab Results  Component Value Date   WBC 6.2 03/03/2018   HGB 13.3 03/03/2018   HCT 37.9 03/03/2018   MCV 96.9 03/03/2018   PLT 323 03/03/2018      Component Value Date/Time   NA 133 (L) 03/03/2018 1353   NA 141 07/01/2016 1516   NA 136 02/22/2015   K 4.7  03/03/2018 1353   K 4.4 02/22/2015   CL 96 (L) 03/03/2018 1353   CL 98 02/22/2015   CO2 29 03/03/2018 1353   CO2 30 02/22/2015   GLUCOSE 84 03/03/2018 1353   BUN 19 03/03/2018 1353   BUN 9 07/01/2016 1516   CREATININE 0.94 (H) 03/03/2018 1353   CALCIUM 9.6 03/03/2018 1353   CALCIUM 9.6 02/22/2015   PROT 6.3 03/03/2018 1353   PROT 6.7 07/01/2016 1516   PROT 6.6 02/22/2015   ALBUMIN 4.3 10/26/2017 1527   ALBUMIN 4.5 07/01/2016 1516   ALBUMIN 4.3 02/22/2015   AST 27 03/03/2018 1353   AST 24 02/22/2015   ALT 20 03/03/2018 1353   ALT 18 02/22/2015   ALKPHOS 47 10/26/2017 1527   ALKPHOS 34 02/22/2015   BILITOT 0.6 03/03/2018 1353   BILITOT 0.3 07/01/2016 1516   GFRNONAA 59 (L) 03/03/2018 1353   GFRAA 68 03/03/2018 1353   Lab Results  Component Value Date   CHOL 185 04/24/2017   HDL 72.00 04/24/2017   LDLCALC 98 04/24/2017    LDLDIRECT 122.9 11/15/2012   TRIG 75.0 04/24/2017   CHOLHDL 3 04/24/2017   Lab Results  Component Value Date   HGBA1C 5.6 10/24/2013   Lab Results  Component Value Date   SHFWYOVZ85 885 (H) 07/22/2016   Lab Results  Component Value Date   TSH 1.11 10/26/2017        ASSESSMENT AND PLAN  Rheumatoid arthritis involving multiple sites with positive rheumatoid factor ([place])  Multiple sclerosis ([place]) - Plan: MR BRAIN WO CONTRAST  Gait disturbance  Right sided sciatica  Trochanteric bursitis of right hip   1.   She has been stable off of a disease modifying therapy for several years.  We need to check an MRI of the brain.  If there is any subclinical progression, we will need to reconsider starting a disease modifying therapy. 2.    Right trochanteric bursa injection with 30 mg Depo-Medrol in [place] using sterile technique.     Right piriformis muscle injection with 30 mg Depo-Medrol in [place] using sterile technique.   She tolerated the injections well. 3.   Continue valium at bedtime.   Change oxycodone at night to tramadol. 4.    return to see [place] in 5  months or sooner if she has new or worsening neurologic symptoms.    Arwyn Besaw A. Felecia Shelling, MD, PhD 02/77/4128, 7:86 PM Certified in Neurology, Clinical Neurophysiology, Sleep Medicine, Pain Medicine and Neuroimaging  [facility] [address], [place], [place] [address] [phone]

## 2018-04-15 ENCOUNTER — Telehealth: Payer: Self-pay | Admitting: Neurology

## 2018-04-15 NOTE — Telephone Encounter (Signed)
Spoke to the patient she is aware to give GI a call if she has not heard in the next 2-3 business days at [phone].

## 2018-04-15 NOTE — Telephone Encounter (Signed)
[employer] medicare order sent to GI. No auth they will reach out to the pt to schedule.  °

## 2018-04-19 ENCOUNTER — Telehealth: Payer: Self-pay | Admitting: Neurology

## 2018-04-19 ENCOUNTER — Other Ambulatory Visit: Payer: Self-pay

## 2018-04-19 DIAGNOSIS — Z79899 Other long term (current) drug therapy: Secondary | ICD-10-CM

## 2018-04-19 DIAGNOSIS — M0579 Rheumatoid arthritis with rheumatoid factor of multiple sites without organ or systems involvement: Secondary | ICD-10-CM

## 2018-04-19 DIAGNOSIS — M5431 Sciatica, right side: Secondary | ICD-10-CM

## 2018-04-19 DIAGNOSIS — R269 Unspecified abnormalities of gait and mobility: Secondary | ICD-10-CM

## 2018-04-19 DIAGNOSIS — G35 Multiple sclerosis: Secondary | ICD-10-CM

## 2018-04-19 NOTE — Telephone Encounter (Signed)
Called and spoke with pt. Advised Dr. Felecia Shelling was ok to order MRI right hip as well. Will have to get insurance auth first and then she can try and schedule same day as MRI brain. She verbalized understanding.

## 2018-04-19 NOTE — Telephone Encounter (Signed)
Pt requesting a call stating she is still having intense hip pain(right side) would like Dr. Felecia Shelling to add an order for and MRI for her right hip as well.

## 2018-04-19 NOTE — Telephone Encounter (Signed)
Called and spoke with pt. She states right hip pain not any better. Dr. Felecia Shelling did injections at office visit on 04-14-18 but this only helped that day. She would like MRI right hip added if possible. Advised I will have to speak with Dr. Felecia Shelling to see if ok to add. I will call her back to let her know. She verbalized understanding.

## 2018-04-19 NOTE — Telephone Encounter (Signed)
Ok we can add

## 2018-04-20 LAB — CBC WITH DIFFERENTIAL/PLATELET
BASOS ABS: 38 {cells}/uL (ref 0–200)
BASOS PCT: 0.7 %
EOS ABS: 22 {cells}/uL (ref 15–500)
Eosinophils Relative: 0.4 %
HEMATOCRIT: 37 % (ref 35.0–45.0)
Hemoglobin: 13.2 g/dL (ref 11.7–15.5)
Lymphs Abs: 1010 cells/uL (ref 850–3900)
MCH: 35.4 pg — AB (ref 27.0–33.0)
MCHC: 35.7 g/dL (ref 32.0–36.0)
MCV: 99.2 fL (ref 80.0–100.0)
MPV: 9.6 fL (ref 7.5–12.5)
Monocytes Relative: 14 %
Neutro Abs: 3575 cells/uL (ref 1500–7800)
Neutrophils Relative %: 66.2 %
Platelets: 330 10*3/uL (ref 140–400)
RBC: 3.73 10*6/uL — ABNORMAL LOW (ref 3.80–5.10)
RDW: 15.5 % — AB (ref 11.0–15.0)
TOTAL LYMPHOCYTE: 18.7 %
WBC: 5.4 10*3/uL (ref 3.8–10.8)
WBCMIX: 756 {cells}/uL (ref 200–950)

## 2018-04-20 LAB — COMPLETE METABOLIC PANEL WITH GFR
AG RATIO: 2.3 (calc) (ref 1.0–2.5)
ALKALINE PHOSPHATASE (APISO): 37 U/L (ref 33–130)
ALT: 22 U/L (ref 6–29)
AST: 29 U/L (ref 10–35)
Albumin: 4.3 g/dL (ref 3.6–5.1)
BILIRUBIN TOTAL: 0.5 mg/dL (ref 0.2–1.2)
BUN: 9 mg/dL (ref 7–25)
CHLORIDE: 99 mmol/L (ref 98–110)
CO2: 30 mmol/L (ref 20–32)
Calcium: 9.7 mg/dL (ref 8.6–10.4)
Creat: 0.9 mg/dL (ref 0.60–0.93)
GFR, EST AFRICAN AMERICAN: 72 mL/min/{1.73_m2} (ref >=60)
GFR, Est Non African American: 62 mL/min/{1.73_m2} (ref >=60)
GLUCOSE: 82 mg/dL (ref 65–99)
Globulin: 1.9 g/dL (calc) (ref 1.9–3.7)
POTASSIUM: 4.5 mmol/L (ref 3.5–5.3)
Sodium: 136 mmol/L (ref 135–146)
Total Protein: 6.2 g/dL (ref 6.1–8.1)

## 2018-04-20 NOTE — Telephone Encounter (Signed)
Done, thanks

## 2018-04-20 NOTE — Progress Notes (Signed)
Labs are stable.

## 2018-04-21 ENCOUNTER — Other Ambulatory Visit: Payer: Self-pay | Admitting: Physician Assistant

## 2018-04-21 DIAGNOSIS — D0462 Carcinoma in situ of skin of left upper limb, including shoulder: Secondary | ICD-10-CM | POA: Diagnosis not present

## 2018-04-21 DIAGNOSIS — D0461 Carcinoma in situ of skin of right upper limb, including shoulder: Secondary | ICD-10-CM | POA: Diagnosis not present

## 2018-04-26 DIAGNOSIS — M7061 Trochanteric bursitis, right hip: Secondary | ICD-10-CM | POA: Diagnosis not present

## 2018-04-26 DIAGNOSIS — M6281 Muscle weakness (generalized): Secondary | ICD-10-CM | POA: Diagnosis not present

## 2018-04-26 DIAGNOSIS — M62838 Other muscle spasm: Secondary | ICD-10-CM | POA: Diagnosis not present

## 2018-04-26 DIAGNOSIS — M6289 Other specified disorders of muscle: Secondary | ICD-10-CM | POA: Diagnosis not present

## 2018-04-26 MED FILL — PROLIA 60 MG/ML SOLN: 60 | 180 days supply | Qty: 1 | Fill #0

## 2018-04-27 NOTE — Telephone Encounter (Signed)
Prolia has been received from pharmacy and placed in fridge for patient's appointment tomorrow, 04/28/18.  3:05 PM Beatriz Chancellor, CPhT

## 2018-04-28 ENCOUNTER — Ambulatory Visit: Payer: Medicare Other | Admitting: Physician Assistant

## 2018-04-28 NOTE — Telephone Encounter (Signed)
Current labs are adequate.

## 2018-04-28 NOTE — Telephone Encounter (Signed)
Patient rescheduled today's visit to 12/10.  She was to receive her Prolia injection.  Labs were drawn on 11/25.  Would you like her to update labs and schedule a separate nurse visit?

## 2018-04-28 NOTE — Progress Notes (Signed)
Office Visit Note  Patient: Sherri [name]             Date of Birth: [birth year]           MRN: [id number]             PCP: Binnie Rail, MD Referring: Binnie Rail, MD Visit Date: 05/04/2018 Occupation: @GUAROCC @  Subjective:  right trochanteric bursitis   History of Present Illness: Sherri [name] is a 77 y.o. female with history of rheumatoid arthritis osteoarthritis and degenerative disc disease. She feels her RA symptoms have improved since starting [name] then switching to Olumiant.  She denies any recent flares.  She is having pain in both wrists.  She denies any overuse activities. The pain in her feet have resolved.  She states she continues to have trochanteric bursitis of the right hip. She is questioning if therapy has contributed to newfound fecal incontinence and plans to follow up at her appointment tomorrow. She also has noticed that she is having more weakness in her right leg.   She reports her neck is doing much better. She had a brain MRI on Saturday, which showed progression of MS.   She is here for a prolia injection today.    Activities of Daily Living:  Patient reports morning stiffness for 2 hours.   Patient Reports nocturnal pain.  Difficulty dressing/grooming: Denies Difficulty climbing stairs: Denies Difficulty getting out of chair: Denies Difficulty using hands for taps, buttons, cutlery, and/or writing: Denies  Review of Systems  Constitutional: Positive for fatigue.  HENT: Positive for mouth dryness. Negative for mouth sores and nose dryness.   Eyes: Positive for dryness. Negative for pain and visual disturbance.  Respiratory: Negative for cough, hemoptysis, shortness of breath and difficulty breathing.   Cardiovascular: Negative for chest pain, palpitations, hypertension and swelling in legs/feet.  Gastrointestinal: Negative for blood in stool, constipation and diarrhea.  Endocrine: Negative for increased urination.  Genitourinary: Negative  for painful urination.  Musculoskeletal: Positive for arthralgias, joint pain and muscle weakness. Negative for joint swelling, myalgias, morning stiffness, muscle tenderness and myalgias.  Skin: Negative for color change, pallor, rash, hair loss, nodules/bumps, skin tightness, ulcers and sensitivity to sunlight.  Allergic/Immunologic: Negative for susceptible to infections.  Neurological: Negative for dizziness, numbness, headaches and weakness.  Hematological: Negative for swollen glands.  Psychiatric/Behavioral: Positive for sleep disturbance. Negative for depressed mood. The patient is nervous/anxious (Situational anxiety).     PMFS History:  Patient Active Problem List   Diagnosis Date Noted  . Right sided sciatica 04/14/2018  . Trochanteric bursitis of right hip 04/14/2018  . S/P cervical spinal fusion 11/10/2017  . Swelling of left foot 10/26/2017  . Dysphagia 10/26/2017  . Left ankle swelling 10/26/2017  . Localized swelling, mass, and lump of head 08/18/2017  . Gastroesophageal reflux disease 08/06/2017  . Easy bruising 08/06/2017  . Anxiety 04/24/2017  . Mid back pain 03/26/2017  . Left sided chest pain 03/25/2017  . Dyspnea on exertion 03/14/2017  . Hyperkalemia 03/13/2017  . Rash and nonspecific skin eruption 01/08/2017  . Submandibular gland swelling 01/08/2017  . High risk medication use 09/02/2016  . Urinary frequency 09/02/2016  . Piriformis muscle pain 09/02/2016  . Fatigue 07/22/2016  . Hair loss 07/22/2016  . Right hip pain 04/08/2016  . Right knee pain 02/25/2016  . Dyspepsia 04/05/2015  . Numbness 11/14/2014  . Biceps tendon tear 10/10/2014  . Insomnia 10/10/2014  . Gait disturbance 06/13/2014  . Depression with  anxiety 06/13/2014  . OP (osteoporosis) 01/18/2014  . Other long term (current) drug therapy 01/18/2014  . Other dysphagia 12/29/2013  . Hoarseness 12/29/2013  . 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 Personal history of  colonic polyps 07/03/2010  . Nocturia 05/28/2010  . THYROID NODULE 05/17/2008  . Multiple sclerosis ([place]) 05/17/2008  . Chronic fatigue 03/30/2007  . Hyperlipidemia 08/20/2006  . Essential hypertension 08/20/2006  . Rheumatoid arthritis ([place]) 08/20/2006  . CERVICAL CANCER, HX OF 08/20/2006  . GASTRIC ULCER, HX OF 08/20/2006    Past Medical History:  Diagnosis Date  . Anxiety    takes Valium daily as needed  . Bruises easily    d/t meds  . Cancer ([place])    basal cell ca, in situ- uterine   . Cataracts, bilateral   . Chronic back pain   . Dizziness    r/t to meds  . GERD (gastroesophageal reflux disease)    no meds on a regular basis but will take Tums if needed  . Headache(784.0)    r/t neck issues  . History of bronchitis 6-28yrs ago  . History of colon polyps    benign  . Hyperlipidemia    takes Atorvastatin on Mondays and Fridays  . Hypertension    takes Ramipril daily  . Joint pain   . Joint swelling   . Multiple sclerosis ([place])   . Neuromuscular disorder ([place])    Dr. Park Liter- [facility], follows M.S.  . Nocturia   . OA (osteoarthritis)    Dr Jani Gravel weekly, RA- hands- knees- feet   . PONV (postoperative nausea and vomiting)    trouble urinating after surgery in 2014  . Rheumatoid arthritis(714.0)    Dr Estanislado Pandy takes [name] daily  . Skin cancer   . Urinary retention    sees Dr.Wrenn about 2 times a yr    Family History  Problem Relation Age of Onset  . Cancer Mother        pancreatic  . Diabetes Mother   . Heart disease Father        Rheumatic  . Cancer Father        ? stomach  . Cancer Sister        stomach  . Cancer Maternal Aunt        X 4; ? primary  . Heart disease Paternal Aunt   . Cancer Maternal Grandmother        cervical  . Colon cancer Unknown        Aunts  . Multiple sclerosis Daughter   . Hypertension Neg Hx   . Stroke Neg Hx    Past Surgical History:  Procedure Laterality Date  . ABDOMINAL HYSTERECTOMY       1985  . APPENDECTOMY     with TAH  . BACK SURGERY     several  . BREAST BIOPSY Right    Results were negative  . BROW LIFT Bilateral 10/02/2016   Procedure: BILATERAL LOWER LID BLEPHAROPLASTY;  Surgeon: Clista Bernhardt, MD;  Location: [facility];  Service: Plastics;  Laterality: Bilateral;  . CARPAL TUNNEL RELEASE Right   . cataracts    . CERVICAL FUSION     Dr Joya Salm  . CERVICAL FUSION  12/31/2012   Dr Joya Salm  . CHEST TUBE INSERTION     for traumatic Pneumothorax  . COLONOSCOPY  07/16/2010   normal   . COLONOSCOPY W/ POLYPECTOMY  1997   negative since; Dr Deatra Ina  . ESOPHAGOGASTRODUODENOSCOPY  07/16/2010   normal  . eye lid raise    . EYE SURGERY Bilateral    cataracts removed - /w IOL  . JOINT REPLACEMENT    . LUMBAR FUSION     Dr Joya Salm  . NASAL SINUS SURGERY    . POSTERIOR CERVICAL FUSION/FORAMINOTOMY N/A 12/31/2012   Procedure: POSTERIOR LATERAL CERVICAL FUSION/FORAMINOTOMY LEVEL 1 CERVICAL THREE-FOUR WITH LATERAL MASS SCREWS;  Surgeon: Floyce Stakes, MD;  Location: [facility];  Service: Neurosurgery;  Laterality: N/A;  . PTOSIS REPAIR Bilateral 10/02/2016   Procedure: INTERNAL PTOSIS REPAIR;  Surgeon: Clista Bernhardt, MD;  Location: [facility];  Service: Plastics;  Laterality: Bilateral;  . SKIN BIOPSY    . THYROID SURGERY     R lobe removed- 1972, has grown back - CT last done- 2018  . TOTAL HIP ARTHROPLASTY  12/22/2011   Procedure: TOTAL HIP ARTHROPLASTY;  Surgeon: Kerin Salen, MD;  Location: [facility];  Service: Orthopedics;  Laterality: Left;  . TOTAL SHOULDER ARTHROPLASTY    . TUBAL LIGATION     Social History   Social History Narrative  . Not on file   Immunization History  Administered Date(s) Administered  . Influenza Whole 02/22/2007, 02/08/2013  . Influenza, High Dose Seasonal PF 02/04/2015, 02/18/2017, 02/21/2018  . Influenza-Unspecified 01/24/2014, 01/25/2016, 02/18/2017  . Pneumococcal Conjugate-13 11/23/2012, 09/08/2014  . Pneumococcal  Polysaccharide-23 05/26/2006  . Pneumococcal-Unspecified 12/28/2012  . Td 05/28/2010  . Zoster 05/26/2009    Objective: Vital Signs: BP 115/74 (BP Location: Left Arm, Patient Position: Sitting, Cuff Size: Normal)   Pulse 67   Resp 12   Ht 5' 2.5" (1.588 m)   Wt 115 lb (52.2 kg)   BMI 20.70 kg/m    Physical Exam  Constitutional: She is oriented to person, place, and time. She appears well-developed and well-nourished.  HENT:  Head: Normocephalic and atraumatic.  Eyes: Conjunctivae and EOM are normal.  Neck: Normal range of motion.  Cardiovascular: Normal rate, regular rhythm, normal heart sounds and intact distal pulses.  Pulmonary/Chest: Effort normal and breath sounds normal.  Abdominal: Soft. Bowel sounds are normal.  Lymphadenopathy:    She has no cervical adenopathy.  Neurological: She is alert and oriented to person, place, and time.  Skin: Skin is warm and dry. Capillary refill takes less than 2 seconds.  Psychiatric: She has a normal mood and affect. Her behavior is normal.  Nursing note and vitals reviewed.    Musculoskeletal Exam: C-spine limited ROM with flexion and lateral rotation to the right.  Thoracic and lumbar spine good ROM.  No midline spinal tenderness.  Tenderness of bilateral SI joints. Shoulder joints, elbow joints, wrist joints, MCPs, PIPs, and DIPs good ROM. Right 2nd and 3rd MCP synovial thickening. Synovitis of right 4 PIP joint. Synovial thickening of left 4th PIP joint. PIP and DIP synovial thickening consistent with osteoarthritis of both hands.  Hip joints, knee joints, ankle joints, MTPs, PIPs, and DIPs good ROM with no synovitis.  No warmth or effusion of knee joints.  Tenderness of right trochanteric bursa.   CDAI Exam: CDAI Score: 2.4  Patient Global Assessment: 2 (mm); Provider Global Assessment: 2 (mm) Swollen: 1 ; Tender: 1  Joint Exam      Right  Left  PIP 4  Swollen Tender        Investigation: No additional  findings.  Imaging: Mr Brain Wo Contrast  Result Date: 05/03/2018  [facility] [address] [place], [place] [address] [phone])  [phone] NEUROIMAGING REPORT STUDY DATE: 05/01/2018 PATIENT NAME: LEYNA VANDERKOLK DOB: 1940-12-21 MRN: [id number] EXAM: MRI Brain without contrast ORDERING CLINICIAN: Richard A. Sater, MD. PhD CLINICAL HISTORY: 77 year old woman with multiple sclerosis and gait disturbance COMPARISON FILMS: MRI 07/11/2014 TECHNIQUE: MRI of the brain without contrast was obtained utilizing 5 mm axial slices with T1, T2, T2 flair, SWI and diffusion weighted views.  T1 sagittal, FLAIR sagittal and T2 coronal views were obtained. CONTRAST: none IMAGING SITE: [facility], [place], [place] FINDINGS: On sagittal images, the spinal cord is imaged caudally to C3-C4 and is normal in caliber.   There has been cervical fusion involving at least the C3-C4 level with metal artifact present.  On the sagittal FLAIR images, there appears to be a subtle T2 hyperintense focus within the spinal cord adjacent to C2 consistent with MS.  The contents of the posterior fossa are of normal size and position.   The pituitary gland and optic chiasm appear normal.    There is mild to moderate generalized cortical atrophy, mildly increased compared to the 2016 MRI.  There are no abnormal extra-axial collections of fluid.  There are a couple T2/FLAIR hypertense foci in the pons and one in the right cerebellar hemisphere.     The deep gray matter appears normal.  In the hemispheres, there are multiple single and confluent T2 hyperintense foci in the periventricular, juxtacortical and deep white matter.  None of these appear to be acute and they are essentially unchanged when compared to the previous MRI.[name]  Diffusion weighted images are normal.  Susceptibility weighted images are normal.  The orbits appear normal.   The VIIth/VIIIth nerve complex appears normal.  The mastoid air cells appear  normal.  The paranasal sinuses appear postoperative and there are no inflammatory changes..  Flow voids are identified within the major intracerebral arteries.     This MRI of the brain without contrast shows the following: 1.     There are multiple T2/FLAIR hypertense foci in the hemispheres, pons and a subtle focus in the upper cervical spinal cord in a pattern and configuration consistent with chronic demyelinating plaque associated with advanced multiple sclerosis.   Some of the foci could also be due to superimposed chronic microvascular ischemic changes.  None of the foci appear to be acute and there was no definite changes when compared to the 07/11/2014 MRI. 2.     Mild to moderate generalized cortical atrophy mildly progressed compared to the previous MRI. 3.     There were no acute findings. INTERPRETING PHYSICIAN: Richard A. Felecia Shelling, MD, PhD, FAAN Certified in  Neuroimaging by [employer]    Recent Labs: Lab Results  Component Value Date   WBC 5.4 04/19/2018   HGB 13.2 04/19/2018   PLT 330 04/19/2018   NA 136 04/19/2018   K 4.5 04/19/2018   CL 99 04/19/2018   CO2 30 04/19/2018   GLUCOSE 82 04/19/2018   BUN 9 04/19/2018   CREATININE 0.90 04/19/2018   BILITOT 0.5 04/19/2018   ALKPHOS 47 10/26/2017   AST 29 04/19/2018   ALT 22 04/19/2018   PROT 6.2 04/19/2018   ALBUMIN 4.3 10/26/2017   CALCIUM 9.7 04/19/2018   GFRAA 72 04/19/2018   QFTBGOLDPLUS NEGATIVE 08/31/2017    Speciality Comments: Prior therapy includes: [name] (cost), [name] (cost)  Procedures:  No procedures performed Allergies: Darvon [propoxyphene hcl]; Meperidine hcl; Penicillins; Azathioprine; Leflunomide; and Ezetimibe-simvastatin     Assessment / Plan:  Visit Diagnoses: Rheumatoid arthritis involving multiple sites with positive rheumatoid factor ([place]): She has synovitis of the right 4th PIP joint.  She has tenderness of bilateral wrist joints but no synovitis noted. She was started  on Olumiant 2 mg by mouth daily in October 2019.  She continues to take methotrexate 6 tablets by mouth once weekly and folic acid 1 mg by mouth daily.  She is on prednisone 4 mg by mouth daily.  She has noticed improvement since starting on Olumiant.  She will continue on this current treatment regimen.  She does not need any refills at this time.  She was advised to notify us if she develops increased joint pain or joint swelling.  She will follow-up in the office in 3 months.  We will discuss tapering the dose of prednisone at her next office visit.  High risk medication use - Olumiant 2 mg po daily-started October 2019, MTX 6 tablets p.o. weekly, folic acid 1 mg p.o. daily, prednisone 4 mg p.o. Daily.  Last TB gold was negative on 08/31/2017.  Most recent CBC and CMP were stable on 04/19/2018.  She will be due for lab work in February and every 3 months.  Standing orders are in place.  She is up-to-date on vaccinations except for Shingrix.  Age-related osteoporosis without current pathological fracture -most recent DEXA was on 01/11/2018.  She presented today for Prolia subcutaneous injection.  Plan: denosumab (PROLIA) injection 60 mg Patient given Prolia injection at appointment today. Patient tolerated injection well. Patient was monitored in office for 30 minutes for adverse reactions. No adverse reaction noted. Administrations This Visit    denosumab (PROLIA) injection 60 mg    Admin Date 05/04/2018 Action Given Dose 60 mg Route Subcutaneous Administered By Carole Binning, LPN           Primary osteoarthritis of right hip: She has good range of motion on exam.  History of total hip replacement, left: She has good range of motion on exam with no discomfort.  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: Chronic pain.  She has limited range of motion with flexion and lateral rotation to the right.  She has no symptoms of radiculopathy at this time.  DDD (degenerative disc disease), lumbar:  Chronic pain.  Trochanteric bursitis of right hip: She has tenderness on exam.  She continues to go to physical therapy.  Other medical conditions are listed as follows:  History of multiple sclerosis  Primary insomnia  Other fatigue  History of hyperlipidemia  History of anxiety  History of depression  History of hypertension   Orders: No orders of the defined types were placed in this encounter.  Meds ordered this encounter  Medications  . denosumab (PROLIA) injection 60 mg    Face-to-face time spent with patient was 30 minutes. Greater than 50% of time was spent in counseling and coordination of care.  Follow-Up Instructions: Return in about 3 months (around 08/03/2018) for Rheumatoid arthritis, Osteoporosis, Osteoarthritis, DDD.   Ofilia Neas, PA-C   I examined and evaluated the patient with Hazel Sams PA.  Patient is doing better on Olumiant.  Although she states she was doing much better on [name] samples.  Today she had mild synovitis in her PIP joint on my exam.  Overall she is quite pleased with her current regimen.  We discussed possible prednisone taper as tolerated.  She will also get her Prolia injection for osteoporosis today.  The plan of care was discussed as noted above.  Bo Merino, MD Note - This record has been created using Editor, commissioning.  Chart creation errors have been sought, but may not always  have been located. Such creation errors do not reflect on  the standard of medical care.

## 2018-04-29 DIAGNOSIS — M6289 Other specified disorders of muscle: Secondary | ICD-10-CM | POA: Diagnosis not present

## 2018-04-29 DIAGNOSIS — M62838 Other muscle spasm: Secondary | ICD-10-CM | POA: Diagnosis not present

## 2018-04-29 DIAGNOSIS — M6281 Muscle weakness (generalized): Secondary | ICD-10-CM | POA: Diagnosis not present

## 2018-05-01 ENCOUNTER — Ambulatory Visit
Admission: RE | Admit: 2018-05-01 | Discharge: 2018-05-01 | Disposition: A | Discharge status: Home / Self Care | Payer: Medicare Other | Source: Ambulatory Visit | Attending: Neurology | Admitting: Neurology

## 2018-05-01 DIAGNOSIS — G35 Multiple sclerosis: Secondary | ICD-10-CM | POA: Diagnosis not present

## 2018-05-03 ENCOUNTER — Telehealth: Payer: Self-pay | Admitting: *Deleted

## 2018-05-03 ENCOUNTER — Telehealth: Payer: Self-pay | Admitting: Internal Medicine

## 2018-05-03 DIAGNOSIS — N3010 Interstitial cystitis (chronic) without hematuria: Secondary | ICD-10-CM | POA: Diagnosis not present

## 2018-05-03 DIAGNOSIS — R3914 Feeling of incomplete bladder emptying: Secondary | ICD-10-CM | POA: Diagnosis not present

## 2018-05-03 NOTE — Telephone Encounter (Signed)
I spoke with [name] this am and reviewed below MRI results with her. She verbalized understanding of same/fim

## 2018-05-03 NOTE — Telephone Encounter (Signed)
Spoke with Sherri [name] regarding AWV. He stated to give his [name]  a call back at a later time. SF

## 2018-05-03 NOTE — Telephone Encounter (Signed)
-----   Message from Hope Pigeon, RN sent at 05/03/2018  8:34 AM EST -----   ----- Message ----- From: Britt Bottom, MD Sent: 05/03/2018   8:28 AM EST To: Hope Pigeon, RN  Please let her know that the MRI of the brain looks very similar to the one she had in 2016.  There do not appear to be any new lesions.  She does have just a mild increase in the amount of atrophy.  Which could be due to the combination of MS and age.

## 2018-05-04 ENCOUNTER — Ambulatory Visit
Admission: RE | Admit: 2018-05-04 | Discharge: 2018-05-04 | Disposition: A | Discharge status: Home / Self Care | Payer: Medicare Other | Source: Ambulatory Visit | Attending: Neurology | Admitting: Neurology

## 2018-05-04 ENCOUNTER — Ambulatory Visit: Payer: Medicare Other | Admitting: Rheumatology

## 2018-05-04 ENCOUNTER — Encounter: Payer: Self-pay | Admitting: Physician Assistant

## 2018-05-04 VITALS — BP 131/72 | HR 70 | Resp 12 | Ht 62.5 in | Wt 115.0 lb

## 2018-05-04 DIAGNOSIS — Z8639 Personal history of other endocrine, nutritional and metabolic disease: Secondary | ICD-10-CM

## 2018-05-04 DIAGNOSIS — M810 Age-related osteoporosis without current pathological fracture: Secondary | ICD-10-CM | POA: Diagnosis not present

## 2018-05-04 DIAGNOSIS — Z8679 Personal history of other diseases of the circulatory system: Secondary | ICD-10-CM

## 2018-05-04 DIAGNOSIS — M1611 Unilateral primary osteoarthritis, right hip: Secondary | ICD-10-CM | POA: Diagnosis not present

## 2018-05-04 DIAGNOSIS — G35 Multiple sclerosis: Secondary | ICD-10-CM

## 2018-05-04 DIAGNOSIS — F5101 Primary insomnia: Secondary | ICD-10-CM

## 2018-05-04 DIAGNOSIS — Z79899 Other long term (current) drug therapy: Secondary | ICD-10-CM

## 2018-05-04 DIAGNOSIS — M0579 Rheumatoid arthritis with rheumatoid factor of multiple sites without organ or systems involvement: Secondary | ICD-10-CM

## 2018-05-04 DIAGNOSIS — M5431 Sciatica, right side: Secondary | ICD-10-CM

## 2018-05-04 DIAGNOSIS — Z8669 Personal history of other diseases of the nervous system and sense organs: Secondary | ICD-10-CM

## 2018-05-04 DIAGNOSIS — M7061 Trochanteric bursitis, right hip: Secondary | ICD-10-CM

## 2018-05-04 DIAGNOSIS — Z96642 Presence of left artificial hip joint: Secondary | ICD-10-CM

## 2018-05-04 DIAGNOSIS — R269 Unspecified abnormalities of gait and mobility: Secondary | ICD-10-CM

## 2018-05-04 DIAGNOSIS — M51369 Other intervertebral disc degeneration, lumbar region without mention of lumbar back pain or lower extremity pain: Secondary | ICD-10-CM

## 2018-05-04 DIAGNOSIS — R5383 Other fatigue: Secondary | ICD-10-CM

## 2018-05-04 DIAGNOSIS — M4312 Spondylolisthesis, cervical region: Secondary | ICD-10-CM

## 2018-05-04 DIAGNOSIS — Z8659 Personal history of other mental and behavioral disorders: Secondary | ICD-10-CM

## 2018-05-04 DIAGNOSIS — M5136 Other intervertebral disc degeneration, lumbar region: Secondary | ICD-10-CM

## 2018-05-04 MED ORDER — GADOBENATE DIMEGLUMINE 529 MG/ML IV SOLN
10.0000 mL | Freq: Once | INTRAVENOUS | Status: AC | PRN
  Administered 2018-05-04: 10 mL via INTRAVENOUS

## 2018-05-04 MED ORDER — DENOSUMAB 60 MG/ML ~~LOC~~ SOSY
60.0000 mg | PREFILLED_SYRINGE | Freq: Once | SUBCUTANEOUS | Status: AC
  Administered 2018-05-04: 60 mg via SUBCUTANEOUS

## 2018-05-04 NOTE — Patient Instructions (Signed)
Standing Labs We placed an order today for your standing lab work.    Please come back and get your standing labs in February and every 3 months  We have open lab Monday through Friday from 8:30-11:30 AM and 1:30-4:00 PM  at the office of Dr. Shaili Deveshwar.   You may experience shorter wait times on Monday and Friday afternoons. The office is located at [address], [address], [place], [place] [address] No appointment is necessary.   Labs are drawn by [facility].  You may receive a bill from [facility] for your lab work. If you have any questions regarding directions or hours of operation,  please call [phone].   Just as a reminder please drink plenty of water prior to coming for your lab work. Thanks!  

## 2018-05-06 DIAGNOSIS — M6289 Other specified disorders of muscle: Secondary | ICD-10-CM | POA: Diagnosis not present

## 2018-05-06 DIAGNOSIS — M6281 Muscle weakness (generalized): Secondary | ICD-10-CM | POA: Diagnosis not present

## 2018-05-06 DIAGNOSIS — M62838 Other muscle spasm: Secondary | ICD-10-CM | POA: Diagnosis not present

## 2018-05-06 DIAGNOSIS — M7061 Trochanteric bursitis, right hip: Secondary | ICD-10-CM | POA: Diagnosis not present

## 2018-05-07 ENCOUNTER — Telehealth: Payer: Self-pay | Admitting: *Deleted

## 2018-05-07 ENCOUNTER — Other Ambulatory Visit: Payer: Self-pay | Admitting: Internal Medicine

## 2018-05-07 NOTE — Telephone Encounter (Signed)
Spoke to patient - she is aware of her MRI results and verbalized understanding.  She would like to hold off on an orthopaedic referral for now but will call us back if she changes her mind.

## 2018-05-07 NOTE — Telephone Encounter (Signed)
-----   Message from Britt Bottom, MD sent at 05/07/2018 12:43 PM EST ----- Please let her know that the MRI of the hip does show some degenerative changes and one small tear in the cartilage.   If she is not doing any better we can refer to orthopedics.

## 2018-05-10 DIAGNOSIS — M62838 Other muscle spasm: Secondary | ICD-10-CM | POA: Diagnosis not present

## 2018-05-10 DIAGNOSIS — M6281 Muscle weakness (generalized): Secondary | ICD-10-CM | POA: Diagnosis not present

## 2018-05-10 DIAGNOSIS — M7061 Trochanteric bursitis, right hip: Secondary | ICD-10-CM | POA: Diagnosis not present

## 2018-05-10 DIAGNOSIS — M6289 Other specified disorders of muscle: Secondary | ICD-10-CM | POA: Diagnosis not present

## 2018-05-11 ENCOUNTER — Telehealth: Payer: Self-pay | Admitting: Pharmacy Technician

## 2018-05-11 NOTE — Telephone Encounter (Signed)
Received email from Sherri [name] [facility], patient's copay for [place] has increased to $571.08. I called insurance, patient has fallen into the coverage gap. Discussed with [employer], she stated it will be ok to provide patient with sample to get her into the new year. Spoke to patient, she will come by office to pick up sample.  9:13 AM Beatriz Chancellor, CPhT

## 2018-05-13 DIAGNOSIS — M6289 Other specified disorders of muscle: Secondary | ICD-10-CM | POA: Diagnosis not present

## 2018-05-13 DIAGNOSIS — M7061 Trochanteric bursitis, right hip: Secondary | ICD-10-CM | POA: Diagnosis not present

## 2018-05-13 DIAGNOSIS — M62838 Other muscle spasm: Secondary | ICD-10-CM | POA: Diagnosis not present

## 2018-05-13 DIAGNOSIS — M6281 Muscle weakness (generalized): Secondary | ICD-10-CM | POA: Diagnosis not present

## 2018-05-14 ENCOUNTER — Other Ambulatory Visit: Payer: Self-pay

## 2018-05-14 DIAGNOSIS — M810 Age-related osteoporosis without current pathological fracture: Secondary | ICD-10-CM | POA: Diagnosis not present

## 2018-05-14 DIAGNOSIS — Z79899 Other long term (current) drug therapy: Secondary | ICD-10-CM | POA: Diagnosis not present

## 2018-05-14 LAB — CBC WITH DIFFERENTIAL/PLATELET
Absolute Monocytes: 472 cells/uL (ref 200–950)
Basophils Absolute: 12 cells/uL (ref 0–200)
Basophils Relative: 0.3 %
Eosinophils Absolute: 20 cells/uL (ref 15–500)
Eosinophils Relative: 0.5 %
HEMATOCRIT: 36.3 % (ref 35.0–45.0)
Hemoglobin: 12.6 g/dL (ref 11.7–15.5)
LYMPHS ABS: 722 {cells}/uL — AB (ref 850–3900)
MCH: 34.9 pg — ABNORMAL HIGH (ref 27.0–33.0)
MCHC: 34.7 g/dL (ref 32.0–36.0)
MCV: 100.6 fL — ABNORMAL HIGH (ref 80.0–100.0)
MPV: 9.3 fL (ref 7.5–12.5)
Monocytes Relative: 12.1 %
Neutro Abs: 2675 cells/uL (ref 1500–7800)
Neutrophils Relative %: 68.6 %
Platelets: 410 10*3/uL — ABNORMAL HIGH (ref 140–400)
RBC: 3.61 10*6/uL — ABNORMAL LOW (ref 3.80–5.10)
RDW: 14.5 % (ref 11.0–15.0)
Total Lymphocyte: 18.5 %
WBC: 3.9 10*3/uL (ref 3.8–10.8)

## 2018-05-14 LAB — COMPLETE METABOLIC PANEL WITH GFR
AG Ratio: 1.6 (calc) (ref 1.0–2.5)
ALT: 26 U/L (ref 6–29)
AST: 43 U/L — ABNORMAL HIGH (ref 10–35)
Albumin: 4 g/dL (ref 3.6–5.1)
Alkaline phosphatase (APISO): 51 U/L (ref 33–130)
BILIRUBIN TOTAL: 0.5 mg/dL (ref 0.2–1.2)
BUN/Creatinine Ratio: 14 (calc) (ref 6–22)
BUN: 14 mg/dL (ref 7–25)
CHLORIDE: 101 mmol/L (ref 98–110)
CO2: 27 mmol/L (ref 20–32)
Calcium: 9.3 mg/dL (ref 8.6–10.4)
Creat: 0.98 mg/dL — ABNORMAL HIGH (ref 0.60–0.93)
GFR, Est African American: 65 mL/min/{1.73_m2} (ref >=60)
GFR, Est Non African American: 56 mL/min/{1.73_m2} — ABNORMAL LOW (ref >=60)
GLUCOSE: 92 mg/dL (ref 65–99)
Globulin: 2.5 g/dL (calc) (ref 1.9–3.7)
Potassium: 5 mmol/L (ref 3.5–5.3)
Sodium: 138 mmol/L (ref 135–146)
Total Protein: 6.5 g/dL (ref 6.1–8.1)

## 2018-05-14 NOTE — Telephone Encounter (Signed)
Patient came into office for labs and picked up samples.  Medication Samples have been provided to the patient.  Drug name: Olumiant       Strength: 2mg          Qty: 2 bottles (60 tabs) LOT: [id number] A Exp.Date: 07/2018  Dosing instructions: Take 1 tablet by mouth daily  The patient has been instructed regarding the correct time, dose, and frequency of taking this medication, including desired effects and most common side effects.   Sherri [name] 1:45 PM 05/14/2018

## 2018-05-17 ENCOUNTER — Other Ambulatory Visit: Payer: Self-pay | Admitting: Rheumatology

## 2018-05-17 NOTE — Telephone Encounter (Signed)
Last Visit:05/04/18 Next Visit: 08/05/18 Labs: 05/14/18 AST 43, Creat. 0.98, GFR 56  Okay to refill MTX?

## 2018-05-17 NOTE — Progress Notes (Signed)
Mild elevation of creatinine and AST was noted.  Patient is on statins and also on diuretics.  We will continue to monitor.

## 2018-05-17 NOTE — Telephone Encounter (Signed)
Last visit: 05/04/18 Next visit: 08/05/18  Okay to refill per Dr. Estanislado Pandy

## 2018-05-17 NOTE — Telephone Encounter (Signed)
ok 

## 2018-05-27 MED FILL — OLUMIANT 2 MG TABS: 2 | 30 days supply | Qty: 30 | Fill #1

## 2018-05-27 NOTE — Telephone Encounter (Signed)
Ran test claim for Olumiant is now $150 for 1 month supply. Spoke to patient and that copay is fine. She still has samples and will call the pharmacy to schedule next shipment.

## 2018-05-28 DIAGNOSIS — M62838 Other muscle spasm: Secondary | ICD-10-CM | POA: Diagnosis not present

## 2018-05-28 DIAGNOSIS — M7061 Trochanteric bursitis, right hip: Secondary | ICD-10-CM | POA: Diagnosis not present

## 2018-05-28 DIAGNOSIS — M6281 Muscle weakness (generalized): Secondary | ICD-10-CM | POA: Diagnosis not present

## 2018-05-28 DIAGNOSIS — M6289 Other specified disorders of muscle: Secondary | ICD-10-CM | POA: Diagnosis not present

## 2018-06-30 DIAGNOSIS — M6281 Muscle weakness (generalized): Secondary | ICD-10-CM | POA: Diagnosis not present

## 2018-06-30 DIAGNOSIS — M7061 Trochanteric bursitis, right hip: Secondary | ICD-10-CM | POA: Diagnosis not present

## 2018-06-30 DIAGNOSIS — M6289 Other specified disorders of muscle: Secondary | ICD-10-CM | POA: Diagnosis not present

## 2018-06-30 DIAGNOSIS — M62838 Other muscle spasm: Secondary | ICD-10-CM | POA: Diagnosis not present

## 2018-07-05 DIAGNOSIS — M7061 Trochanteric bursitis, right hip: Secondary | ICD-10-CM | POA: Diagnosis not present

## 2018-07-05 DIAGNOSIS — M6289 Other specified disorders of muscle: Secondary | ICD-10-CM | POA: Diagnosis not present

## 2018-07-05 DIAGNOSIS — M62838 Other muscle spasm: Secondary | ICD-10-CM | POA: Diagnosis not present

## 2018-07-05 DIAGNOSIS — M6281 Muscle weakness (generalized): Secondary | ICD-10-CM | POA: Diagnosis not present

## 2018-07-08 ENCOUNTER — Other Ambulatory Visit: Payer: Self-pay | Admitting: Physician Assistant

## 2018-07-08 DIAGNOSIS — D485 Neoplasm of uncertain behavior of skin: Secondary | ICD-10-CM | POA: Diagnosis not present

## 2018-07-08 DIAGNOSIS — D0471 Carcinoma in situ of skin of right lower limb, including hip: Secondary | ICD-10-CM | POA: Diagnosis not present

## 2018-07-08 DIAGNOSIS — L570 Actinic keratosis: Secondary | ICD-10-CM | POA: Diagnosis not present

## 2018-07-08 DIAGNOSIS — D0472 Carcinoma in situ of skin of left lower limb, including hip: Secondary | ICD-10-CM | POA: Diagnosis not present

## 2018-07-09 ENCOUNTER — Other Ambulatory Visit: Payer: Self-pay | Admitting: Internal Medicine

## 2018-07-12 DIAGNOSIS — H04123 Dry eye syndrome of bilateral lacrimal glands: Secondary | ICD-10-CM | POA: Diagnosis not present

## 2018-07-12 DIAGNOSIS — H524 Presbyopia: Secondary | ICD-10-CM | POA: Diagnosis not present

## 2018-07-12 DIAGNOSIS — H5211 Myopia, right eye: Secondary | ICD-10-CM | POA: Diagnosis not present

## 2018-07-12 DIAGNOSIS — H52203 Unspecified astigmatism, bilateral: Secondary | ICD-10-CM | POA: Diagnosis not present

## 2018-07-21 DIAGNOSIS — M7061 Trochanteric bursitis, right hip: Secondary | ICD-10-CM | POA: Diagnosis not present

## 2018-07-21 DIAGNOSIS — M62838 Other muscle spasm: Secondary | ICD-10-CM | POA: Diagnosis not present

## 2018-07-21 DIAGNOSIS — M6289 Other specified disorders of muscle: Secondary | ICD-10-CM | POA: Diagnosis not present

## 2018-07-21 DIAGNOSIS — M6281 Muscle weakness (generalized): Secondary | ICD-10-CM | POA: Diagnosis not present

## 2018-07-23 ENCOUNTER — Other Ambulatory Visit: Payer: Self-pay | Admitting: Neurology

## 2018-07-23 NOTE — Progress Notes (Deleted)
Office Visit Note  Patient: Sherri [name]             Date of Birth: 26-Jun-1940           MRN: [id number]             PCP: Binnie Rail, MD Referring: Binnie Rail, MD Visit Date: 08/05/2018 Occupation: @GUAROCC @  Subjective:  No chief complaint on file.   History of Present Illness: Sherri [name] is a 78 y.o. female ***   Activities of Daily Living:  Patient reports morning stiffness for *** {minute/hour:19697}.   Patient {ACTIONS;DENIES/REPORTS:21021675::"Denies"} nocturnal pain.  Difficulty dressing/grooming: {ACTIONS;DENIES/REPORTS:21021675::"Denies"} Difficulty climbing stairs: {ACTIONS;DENIES/REPORTS:21021675::"Denies"} Difficulty getting out of chair: {ACTIONS;DENIES/REPORTS:21021675::"Denies"} Difficulty using hands for taps, buttons, cutlery, and/or writing: {ACTIONS;DENIES/REPORTS:21021675::"Denies"}  No Rheumatology ROS completed.   PMFS History:  Patient Active Problem List   Diagnosis Date Noted  . Right sided sciatica 04/14/2018  . Trochanteric bursitis of right hip 04/14/2018  . S/P cervical spinal fusion 11/10/2017  . Swelling of left foot 10/26/2017  . Dysphagia 10/26/2017  . Left ankle swelling 10/26/2017  . Localized swelling, mass, and lump of head 08/18/2017  . Gastroesophageal reflux disease 08/06/2017  . Easy bruising 08/06/2017  . Anxiety 04/24/2017  . Mid back pain 03/26/2017  . Left sided chest pain 03/25/2017  . Dyspnea on exertion 03/14/2017  . Hyperkalemia 03/13/2017  . Rash and nonspecific skin eruption 01/08/2017  . Submandibular gland swelling 01/08/2017  . High risk medication use 09/02/2016  . Urinary frequency 09/02/2016  . Piriformis muscle pain 09/02/2016  . Fatigue 07/22/2016  . Hair loss 07/22/2016  . Right hip pain 04/08/2016  . Right knee pain 02/25/2016  . Dyspepsia 04/05/2015  . Numbness 11/14/2014  . Biceps tendon tear 10/10/2014  . Insomnia 10/10/2014  . Gait disturbance 06/13/2014  . Depression with  anxiety 06/13/2014  . OP (osteoporosis) 01/18/2014  . Other long term (current) drug therapy 01/18/2014  . Other dysphagia 12/29/2013  . Hoarseness 12/29/2013  . 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 Personal history of colonic polyps 07/03/2010  . Nocturia 05/28/2010  . THYROID NODULE 05/17/2008  . Multiple sclerosis ([place]) 05/17/2008  . Chronic fatigue 03/30/2007  . Hyperlipidemia 08/20/2006  . Essential hypertension 08/20/2006  . Rheumatoid arthritis ([place]) 08/20/2006  . CERVICAL CANCER, HX OF 08/20/2006  . GASTRIC ULCER, HX OF 08/20/2006    Past Medical History:  Diagnosis Date  . Anxiety    takes Valium daily as needed  . Bruises easily    d/t meds  . Cancer ([place])    basal cell ca, in situ- uterine   . Cataracts, bilateral   . Chronic back pain   . Dizziness    r/t to meds  . GERD (gastroesophageal reflux disease)    no meds on a regular basis but will take Tums if needed  . Headache(784.0)    r/t neck issues  . History of bronchitis 6-38yrs ago  . History of colon polyps    benign  . Hyperlipidemia    takes Atorvastatin on Mondays and Fridays  . Hypertension    takes Ramipril daily  . Joint pain   . Joint swelling   . Multiple sclerosis ([place])   . Neuromuscular disorder ([place])    Dr. Park Liter- [facility], follows M.S.  . Nocturia   . OA (osteoarthritis)    Dr Jani Gravel weekly, RA- hands- knees- feet   . PONV (postoperative nausea and vomiting)    trouble  urinating after surgery in 2014  . Rheumatoid arthritis(714.0)    Dr Estanislado Pandy takes [name] daily  . Skin cancer   . Urinary retention    sees Dr.Wrenn about 2 times a yr    Family History  Problem Relation Age of Onset  . Cancer Mother        pancreatic  . Diabetes Mother   . Heart disease Father        Rheumatic  . Cancer Father        ? stomach  . Cancer Sister        stomach  . Cancer Maternal Aunt        X 4; ? primary  . Heart disease Paternal Aunt   .  Cancer Maternal Grandmother        cervical  . Colon cancer Unknown        Aunts  . Multiple sclerosis Daughter   . Hypertension Neg Hx   . Stroke Neg Hx    Past Surgical History:  Procedure Laterality Date  . ABDOMINAL HYSTERECTOMY     1985  . APPENDECTOMY     with TAH  . BACK SURGERY     several  . BREAST BIOPSY Right    Results were negative  . BROW LIFT Bilateral 10/02/2016   Procedure: BILATERAL LOWER LID BLEPHAROPLASTY;  Surgeon: Clista Bernhardt, MD;  Location: [place];  Service: Plastics;  Laterality: Bilateral;  . CARPAL TUNNEL RELEASE Right   . cataracts    . CERVICAL FUSION     Dr Joya Salm  . CERVICAL FUSION  12/31/2012   Dr Joya Salm  . CHEST TUBE INSERTION     for traumatic Pneumothorax  . COLONOSCOPY  07/16/2010   normal   . COLONOSCOPY W/ POLYPECTOMY  1997   negative since; Dr Deatra Ina  . ESOPHAGOGASTRODUODENOSCOPY  07/16/2010   normal  . eye lid raise    . EYE SURGERY Bilateral    cataracts removed - /w IOL  . JOINT REPLACEMENT    . LUMBAR FUSION     Dr Joya Salm  . NASAL SINUS SURGERY    . POSTERIOR CERVICAL FUSION/FORAMINOTOMY N/A 12/31/2012   Procedure: POSTERIOR LATERAL CERVICAL FUSION/FORAMINOTOMY LEVEL 1 CERVICAL THREE-FOUR WITH LATERAL MASS SCREWS;  Surgeon: Floyce Stakes, MD;  Location: [facility];  Service: Neurosurgery;  Laterality: N/A;  . PTOSIS REPAIR Bilateral 10/02/2016   Procedure: INTERNAL PTOSIS REPAIR;  Surgeon: Clista Bernhardt, MD;  Location: [place];  Service: Plastics;  Laterality: Bilateral;  . SKIN BIOPSY    . THYROID SURGERY     R lobe removed- 1972, has grown back - CT last done- 2018  . TOTAL HIP ARTHROPLASTY  12/22/2011   Procedure: TOTAL HIP ARTHROPLASTY;  Surgeon: Kerin Salen, MD;  Location: [place];  Service: Orthopedics;  Laterality: Left;  . TOTAL SHOULDER ARTHROPLASTY    . TUBAL LIGATION     Social History   Social History Narrative  . Not on file   Immunization History  Administered Date(s) Administered  . Influenza  Whole 02/22/2007, 02/08/2013  . Influenza, High Dose Seasonal PF 02/04/2015, 02/18/2017, 02/21/2018  . Influenza-Unspecified 01/24/2014, 01/25/2016, 02/18/2017  . Pneumococcal Conjugate-13 11/23/2012, 09/08/2014  . Pneumococcal Polysaccharide-23 05/26/2006  . Pneumococcal-Unspecified 12/28/2012  . Td 05/28/2010  . Zoster 05/26/2009     Objective: Vital Signs: There were no vitals taken for this visit.   Physical Exam   Musculoskeletal Exam: ***  CDAI Exam: CDAI Score: Not documented Patient Global Assessment:  Not documented; Provider Global Assessment: Not documented Swollen: Not documented; Tender: Not documented Joint Exam   Not documented   There is currently no information documented on the homunculus. Go to the Rheumatology activity and complete the homunculus joint exam.  Investigation: No additional findings.  Imaging: No results found.  Recent Labs: Lab Results  Component Value Date   WBC 3.9 05/14/2018   HGB 12.6 05/14/2018   PLT 410 (H) 05/14/2018   NA 138 05/14/2018   K 5.0 05/14/2018   CL 101 05/14/2018   CO2 27 05/14/2018   GLUCOSE 92 05/14/2018   BUN 14 05/14/2018   CREATININE 0.98 (H) 05/14/2018   BILITOT 0.5 05/14/2018   ALKPHOS 47 10/26/2017   AST 43 (H) 05/14/2018   ALT 26 05/14/2018   PROT 6.5 05/14/2018   ALBUMIN 4.3 10/26/2017   CALCIUM 9.3 05/14/2018   GFRAA 65 05/14/2018   QFTBGOLDPLUS NEGATIVE 08/31/2017    Speciality Comments: Prior therapy includes: [name] (cost), [name] (cost)  Procedures:  No procedures performed Allergies: Darvon [propoxyphene hcl]; Meperidine hcl; Penicillins; Azathioprine; Leflunomide; and Ezetimibe-simvastatin   Assessment / Plan:     Visit Diagnoses: Rheumatoid arthritis involving multiple sites with positive rheumatoid factor (HCC)  High risk medication use -  Olumiant 2 mg po daily-started October 2019, MTX 6 tablets p.o. weekly, folic acid 1 mg p.o. daily, prednisone 4 mg p.o. Daily  Age-related  osteoporosis without current pathological fracture  Primary osteoarthritis of right hip  History of total hip replacement, left  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  DDD (degenerative disc disease), lumbar  Trochanteric bursitis of right hip  History of multiple sclerosis  Primary insomnia  Other fatigue  History of hyperlipidemia  History of anxiety  History of depression  History of hypertension   Orders: No orders of the defined types were placed in this encounter.  No orders of the defined types were placed in this encounter.   Face-to-face time spent with patient was *** minutes. Greater than 50% of time was spent in counseling and coordination of care.  Follow-Up Instructions: No follow-ups on file.   Ofilia Neas, PA-C  Note - This record has been created using Dragon software.  Chart creation errors have been sought, but may not always  have been located. Such creation errors do not reflect on  the standard of medical care.

## 2018-07-26 DIAGNOSIS — S52571A Other intraarticular fracture of lower end of right radius, initial encounter for closed fracture: Secondary | ICD-10-CM | POA: Diagnosis not present

## 2018-07-26 DIAGNOSIS — Z87891 Personal history of nicotine dependence: Secondary | ICD-10-CM | POA: Diagnosis not present

## 2018-07-26 DIAGNOSIS — M25551 Pain in right hip: Secondary | ICD-10-CM | POA: Diagnosis not present

## 2018-07-26 DIAGNOSIS — S79911A Unspecified injury of right hip, initial encounter: Secondary | ICD-10-CM | POA: Diagnosis not present

## 2018-07-26 DIAGNOSIS — S52501A Unspecified fracture of the lower end of right radius, initial encounter for closed fracture: Secondary | ICD-10-CM | POA: Diagnosis not present

## 2018-07-28 ENCOUNTER — Telehealth: Payer: Self-pay | Admitting: *Deleted

## 2018-07-28 NOTE — Telephone Encounter (Signed)
Patient called back and states she has an appointment with Dr. Rip Harbour at [facility] tomorrow 07/29/18 at 2:15 pm.

## 2018-07-28 NOTE — Telephone Encounter (Signed)
Patient states she fell and was seen in the emergency room in [facility] Patient states she has broken her wrist and they placed a cast up past her elbow. Patient states she her cast is too tight and her fingers are swollen and her thumb is purplish. Patient states she has had a hip replacement and has fallen 4 times since falling and breaking her wrist. Patient states she is now also having trouble with the hip that she has had the hip replacement on. Patient states she had the hip replacement done by Dr. Hal Morales. Patient advised to contact Dr. Idelle Crouch office at [place] to schedule an appointment as he could evaluate her hip and the cast on her arm and recast if needed.

## 2018-07-29 DIAGNOSIS — M25552 Pain in left hip: Secondary | ICD-10-CM | POA: Diagnosis not present

## 2018-07-29 DIAGNOSIS — M25531 Pain in right wrist: Secondary | ICD-10-CM | POA: Diagnosis not present

## 2018-07-29 NOTE — Progress Notes (Signed)
Office Visit Note  Patient: Sherri [name]             Date of Birth: 02-Feb-1941           MRN: [id number]             PCP: Binnie Rail, MD Referring: Binnie Rail, MD Visit Date: 08/05/2018 Occupation: @GUAROCC @  Subjective:  Right hip pain.      History of Present Illness: Sherri [name] is a 77 y.o. female with history of seropositive rheumatoid arthritis, osteoarthritis and osteoporosis.  She states March 1 while she was [place] the beach she fell and fractured her right wrist.  She was in a cast.  She was seen at [place] [facility] where the cast was taken off and she was given a splint.  She states she has been having pain and discomfort in her bilateral hip joints.  She states after falling at the beach she has had 3 more falls at home.  States her left hip which has been replaced gives out very easily.  She has appointment with Dr. Mayer Camel this afternoon.  She tried prednisone taper but it did not work.  She states that she had to increase prednisone to 4 mg p.o. daily.  She has been on Olumiant  for 30 days.  She states is better than Xeljanz but not as effective as [name].  She continues to have discomfort in her bilateral wrist joints.  She continues to have swelling in her hands.  Activities of Daily Living:  Patient reports morning stiffness for 4-5 hours.   Patient Reports nocturnal pain.  Difficulty dressing/grooming: Reports Difficulty climbing stairs: Reports Difficulty getting out of chair: Reports Difficulty using hands for taps, buttons, cutlery, and/or writing: Reports  Review of Systems  Constitutional: Positive for fatigue. Negative for night sweats, weight gain and weight loss.  HENT: Positive for mouth dryness. Negative for mouth sores, trouble swallowing, trouble swallowing and nose dryness.   Eyes: Negative for pain, redness, visual disturbance and dryness.  Respiratory: Negative for cough, shortness of breath and difficulty breathing.     Cardiovascular: Negative for chest pain, palpitations, hypertension, irregular heartbeat and swelling in legs/feet.  Gastrointestinal: Negative for blood in stool, constipation and diarrhea.  Endocrine: Negative for increased urination.  Genitourinary: Negative for vaginal dryness.  Musculoskeletal: Positive for arthralgias, joint pain, myalgias, morning stiffness and myalgias. Negative for joint swelling, muscle weakness and muscle tenderness.  Skin: Negative for color change, rash, hair loss, skin tightness, ulcers and sensitivity to sunlight.  Allergic/Immunologic: Negative for susceptible to infections.  Neurological: Negative for dizziness, memory loss, night sweats and weakness.  Hematological: Negative for swollen glands.  Psychiatric/Behavioral: Positive for depressed mood and sleep disturbance. The patient is not nervous/anxious.     PMFS History:  Patient Active Problem List   Diagnosis Date Noted  . Right sided sciatica 04/14/2018  . Trochanteric bursitis of right hip 04/14/2018  . S/P cervical spinal fusion 11/10/2017  . Swelling of left foot 10/26/2017  . Dysphagia 10/26/2017  . Left ankle swelling 10/26/2017  . Localized swelling, mass, and lump of head 08/18/2017  . Gastroesophageal reflux disease 08/06/2017  . Easy bruising 08/06/2017  . Anxiety 04/24/2017  . Mid back pain 03/26/2017  . Left sided chest pain 03/25/2017  . Dyspnea on exertion 03/14/2017  . Hyperkalemia 03/13/2017  . Rash and nonspecific skin eruption 01/08/2017  . Submandibular gland swelling 01/08/2017  . High risk medication use 09/02/2016  . Urinary frequency  09/02/2016  . Piriformis muscle pain 09/02/2016  . Fatigue 07/22/2016  . Hair loss 07/22/2016  . Right hip pain 04/08/2016  . Right knee pain 02/25/2016  . Dyspepsia 04/05/2015  . Numbness 11/14/2014  . Biceps tendon tear 10/10/2014  . Insomnia 10/10/2014  . Gait disturbance 06/13/2014  . 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 OP  (osteoporosis) 01/18/2014  . Other long term (current) drug therapy 01/18/2014  . Other dysphagia 12/29/2013  . Hoarseness 12/29/2013  . 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 Personal history of colonic polyps 07/03/2010  . Nocturia 05/28/2010  . THYROID NODULE 05/17/2008  . Multiple sclerosis ([place]) 05/17/2008  . Chronic fatigue 03/30/2007  . Hyperlipidemia 08/20/2006  . Essential hypertension 08/20/2006  . Rheumatoid arthritis ([place]) 08/20/2006  . CERVICAL CANCER, HX OF 08/20/2006  . GASTRIC ULCER, HX OF 08/20/2006    Past Medical History:  Diagnosis Date  . Anxiety    takes Valium daily as needed  . Bruises easily    d/t meds  . Cancer ([place])    basal cell ca, in situ- uterine   . Cataracts, bilateral   . Chronic back pain   . Dizziness    r/t to meds  . GERD (gastroesophageal reflux disease)    no meds on a regular basis but will take Tums if needed  . Headache(784.0)    r/t neck issues  . History of bronchitis 6-75yrs ago  . History of colon polyps    benign  . Hyperlipidemia    takes Atorvastatin on Mondays and Fridays  . Hypertension    takes Ramipril daily  . Joint pain   . Joint swelling   . Multiple sclerosis ([place])   . Neuromuscular disorder ([place])    Dr. Park Liter- [facility], follows M.S.  . Nocturia   . OA (osteoarthritis)    Dr Jani Gravel weekly, RA- hands- knees- feet   . PONV (postoperative nausea and vomiting)    trouble urinating after surgery in 2014  . Rheumatoid arthritis(714.0)    Dr Estanislado Pandy takes [name] daily  . Skin cancer   . Urinary retention    sees Dr.Wrenn about 2 times a yr    Family History  Problem Relation Age of Onset  . Cancer Mother        pancreatic  . Diabetes Mother   . Heart disease Father        Rheumatic  . Cancer Father        ? stomach  . Cancer Sister        stomach  . Cancer Maternal Aunt        X 4; ? primary  . Heart disease Paternal Aunt   . Cancer Maternal Grandmother         cervical  . Colon cancer Unknown        Aunts  . Multiple sclerosis Daughter   . Hypertension Neg Hx   . Stroke Neg Hx    Past Surgical History:  Procedure Laterality Date  . ABDOMINAL HYSTERECTOMY     1985  . APPENDECTOMY     with TAH  . BACK SURGERY     several  . BREAST BIOPSY Right    Results were negative  . BROW LIFT Bilateral 10/02/2016   Procedure: BILATERAL LOWER LID BLEPHAROPLASTY;  Surgeon: Clista Bernhardt, MD;  Location: [place];  Service: Plastics;  Laterality: Bilateral;  . CARPAL TUNNEL RELEASE Right   . cataracts    . CERVICAL  FUSION     Dr Joya Salm  . CERVICAL FUSION  12/31/2012   Dr Joya Salm  . CHEST TUBE INSERTION     for traumatic Pneumothorax  . COLONOSCOPY  07/16/2010   normal   . COLONOSCOPY W/ POLYPECTOMY  1997   negative since; Dr Deatra Ina  . ESOPHAGOGASTRODUODENOSCOPY  07/16/2010   normal  . eye lid raise    . EYE SURGERY Bilateral    cataracts removed - /w IOL  . JOINT REPLACEMENT    . LUMBAR FUSION     Dr Joya Salm  . NASAL SINUS SURGERY    . POSTERIOR CERVICAL FUSION/FORAMINOTOMY N/A 12/31/2012   Procedure: POSTERIOR LATERAL CERVICAL FUSION/FORAMINOTOMY LEVEL 1 CERVICAL THREE-FOUR WITH LATERAL MASS SCREWS;  Surgeon: Floyce Stakes, MD;  Location: [facility];  Service: Neurosurgery;  Laterality: N/A;  . PTOSIS REPAIR Bilateral 10/02/2016   Procedure: INTERNAL PTOSIS REPAIR;  Surgeon: Clista Bernhardt, MD;  Location: [place];  Service: Plastics;  Laterality: Bilateral;  . SKIN BIOPSY    . THYROID SURGERY     R lobe removed- 1972, has grown back - CT last done- 2018  . TOTAL HIP ARTHROPLASTY  12/22/2011   Procedure: TOTAL HIP ARTHROPLASTY;  Surgeon: Kerin Salen, MD;  Location: [place];  Service: Orthopedics;  Laterality: Left;  . TOTAL SHOULDER ARTHROPLASTY    . TUBAL LIGATION     Social History   Social History Narrative  . Not on file   Immunization History  Administered Date(s) Administered  . Influenza Whole 02/22/2007, 02/08/2013    . Influenza, High Dose Seasonal PF 02/04/2015, 02/18/2017, 02/21/2018  . Influenza-Unspecified 01/24/2014, 01/25/2016, 02/18/2017  . Pneumococcal Conjugate-13 11/23/2012, 09/08/2014  . Pneumococcal Polysaccharide-23 05/26/2006  . Pneumococcal-Unspecified 12/28/2012  . Td 05/28/2010  . Zoster 05/26/2009     Objective: Vital Signs: BP 121/77 (BP Location: Left Arm, Patient Position: Sitting, Cuff Size: Small)   Pulse 65   Resp 12   Ht 5\' 2"  (1.575 m)   Wt 118 lb 9.6 oz (53.8 kg)   BMI 21.69 kg/m    Physical Exam Vitals signs and nursing note reviewed.  Constitutional:      Appearance: She is well-developed.  HENT:     Head: Normocephalic and atraumatic.  Eyes:     Conjunctiva/sclera: Conjunctivae normal.  Neck:     Musculoskeletal: Normal range of motion.  Cardiovascular:     Rate and Rhythm: Normal rate and regular rhythm.     Heart sounds: Normal heart sounds.  Pulmonary:     Effort: Pulmonary effort is normal.     Breath sounds: Normal breath sounds.  Abdominal:     General: Bowel sounds are normal.     Palpations: Abdomen is soft.  Lymphadenopathy:     Cervical: No cervical adenopathy.  Skin:    General: Skin is warm and dry.     Capillary Refill: Capillary refill takes less than 2 seconds.  Neurological:     Mental Status: She is alert and oriented to person, place, and time.  Psychiatric:        Behavior: Behavior normal.      Musculoskeletal Exam: C-spine limited lateral rotation on the left.  She has limited range of motion of her lumbar spine.  Shoulder joints with good range of motion.  Elbow joints with good range of motion.  She has right wrist fracture which is swollen and warm to touch.  She has synovial thickening and mild synovitis over right third MCP joint.  She has subluxation of the right first MCP joint which is tender.  She had painful range of motion of bilateral hip joints.  Knee joints were in good range of motion.  CDAI Exam: CDAI Score:  8.2  Patient Global Assessment: 6 (mm); Provider Global Assessment: 6 (mm) Swollen: 2 ; Tender: 7  Joint Exam      Right  Left  Wrist  Swollen Tender   Tender  MCP 2      Tender  MCP 3  Swollen Tender   Tender  Hip   Tender   Tender     Investigation: No additional findings.  Imaging: No results found.  Recent Labs: Lab Results  Component Value Date   WBC 3.9 05/14/2018   HGB 12.6 05/14/2018   PLT 410 (H) 05/14/2018   NA 138 05/14/2018   K 5.0 05/14/2018   CL 101 05/14/2018   CO2 27 05/14/2018   GLUCOSE 92 05/14/2018   BUN 14 05/14/2018   CREATININE 0.98 (H) 05/14/2018   BILITOT 0.5 05/14/2018   ALKPHOS 47 10/26/2017   AST 43 (H) 05/14/2018   ALT 26 05/14/2018   PROT 6.5 05/14/2018   ALBUMIN 4.3 10/26/2017   CALCIUM 9.3 05/14/2018   GFRAA 65 05/14/2018   QFTBGOLDPLUS NEGATIVE 08/31/2017    Speciality Comments: Prior therapy includes: [name] (cost), [name] (cost)  Procedures:  No procedures performed Allergies: Darvon [propoxyphene hcl]; Meperidine hcl; Penicillins; Azathioprine; Leflunomide; and Ezetimibe-simvastatin   Assessment / Plan:     Visit Diagnoses: Rheumatoid arthritis involving multiple sites with positive rheumatoid factor (HCC)-patient continues to have some joint pain and joint swelling.  She states she could not taper prednisone.  She is still on prednisone 4 mg p.o. daily.  She has been Olumiant for 1 month now.  High risk medication use -Olumiant 2 mg daily (started in October 2019), methotrexate 6 tablets weekly, and folic acid 1 mg daily.  Last TB gold negative on 08/31/2017 and will monitor yearly.  Last lipid panel within normal limits on 04/24/2017.  Lipid panel ordered for today and will monitor yearly.  She is on atorvastatin 10 mg managed by her PCP.  Most recent CBC/CMP showed slightly elevated creatinine and AST on 05/14/2018.  CBC/CMP ordered for today will monitor every 3 months.  Standing orders are in place.  Patient received her flu  shot in September and previously received Prevnar 13, Pneumovax 23, and zoster.  Recommend Shingrix vaccine as indicated. prednisone 4 mg p.o. Daily. - Plan: CBC with Differential/Platelet, COMPLETE METABOLIC PANEL WITH GFR, Lipid panel  Age-related osteoporosis without current pathological fracture - She is currently on Prolia 60 mg every 6 months and last injection on 05/04/2018.  She also takes calcium and vitamin D supplement.  DEXA on 01/11/2018 showed T score of -3.9 at radius with BMD of 0.457.  Negative 7.58% change in BMD from previous DEXA on 10/15/15.  Last vitamin D level 57 on 08/31/2017.[name]  Closed fracture of right wrist with routine healing, subsequent encounter-patient has appointment this afternoon at [facility].  She has been having a lot of pain and discomfort.  Brought x-ray of her wrist joint which I reviewed.  She has radius fracture.  It also showed severe narrowing of her MCP joints and invagination of the first second and third MCP joint.  Primary osteoarthritis of right hip-she brought x-ray of her right hip joint which showed mild narrowing.  History of total hip replacement, left-x-ray of the left hip joint was also  reviewed the prosthesis appears to be in place.  She will follow-up with [facility].  She believes her falls are related to her left hip joint.  I have advised her to discuss with her orthopedic surgeon.  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-she has limited range of motion of her cervical spine.  DDD (degenerative disc disease), lumbar-she had discomfort range of motion of her lumbar spine.  Trochanteric bursitis of right hip-she continues to have some discomfort in trochanteric bursa.  Other medical problems are listed as follows:  History of multiple sclerosis  Primary insomnia  Other fatigue  History of hyperlipidemia  History of anxiety  History of depression  History of hypertension   Orders: Orders Placed This  Encounter  Procedures  . CBC with Differential/Platelet  . COMPLETE METABOLIC PANEL WITH GFR  . Lipid panel   No orders of the defined types were placed in this encounter.   Face-to-face time spent with patient was30 minutes. Greater than 50% of time was spent in counseling and coordination of care.  Follow-Up Instructions: Return in about 4 months (around 12/05/2018) for Rheumatoid arthritis.   Bo Merino, MD  Note - This record has been created using Editor, commissioning.  Chart creation errors have been sought, but may not always  have been located. Such creation errors do not reflect on  the standard of medical care.

## 2018-08-02 ENCOUNTER — Telehealth: Payer: Self-pay | Admitting: Pharmacy Technician

## 2018-08-02 NOTE — Telephone Encounter (Signed)
Patient states she her wrist is in a temporary cast. Patient states the wrist fracture is a closed fracture. Patient states she does not have any pain in her wrist. Patient states she has pain in her hip. Patient states she has x-rays at the orthopedic and they were normal. Per Dr. Estanislado Pandy since it is a closed fracture okay to refill Olumiant.

## 2018-08-02 NOTE — Telephone Encounter (Signed)
Thank you! I will let the pharmacy know.

## 2018-08-02 NOTE — Telephone Encounter (Signed)
Patient called in refill to [facility] for Olumiant. Per notes, patient fell and broke wrist recently and was having trouble with her fingers due to cast possibly being too tight.   Is it ok for patient to refill medication?

## 2018-08-03 MED FILL — OLUMIANT 2 MG TABS: 2 | 30 days supply | Qty: 30 | Fill #2

## 2018-08-04 ENCOUNTER — Other Ambulatory Visit: Payer: Self-pay | Admitting: Internal Medicine

## 2018-08-05 ENCOUNTER — Ambulatory Visit: Payer: Medicare Other | Admitting: Rheumatology

## 2018-08-05 ENCOUNTER — Encounter: Payer: Self-pay | Admitting: Rheumatology

## 2018-08-05 ENCOUNTER — Ambulatory Visit: Payer: Medicare Other | Admitting: Physician Assistant

## 2018-08-05 ENCOUNTER — Other Ambulatory Visit: Payer: Self-pay

## 2018-08-05 VITALS — BP 121/77 | HR 65 | Resp 12 | Ht 62.0 in | Wt 118.6 lb

## 2018-08-05 DIAGNOSIS — Z8659 Personal history of other mental and behavioral disorders: Secondary | ICD-10-CM

## 2018-08-05 DIAGNOSIS — Z8639 Personal history of other endocrine, nutritional and metabolic disease: Secondary | ICD-10-CM

## 2018-08-05 DIAGNOSIS — M0579 Rheumatoid arthritis with rheumatoid factor of multiple sites without organ or systems involvement: Secondary | ICD-10-CM | POA: Diagnosis not present

## 2018-08-05 DIAGNOSIS — M51369 Other intervertebral disc degeneration, lumbar region without mention of lumbar back pain or lower extremity pain: Secondary | ICD-10-CM

## 2018-08-05 DIAGNOSIS — M25561 Pain in right knee: Secondary | ICD-10-CM | POA: Diagnosis not present

## 2018-08-05 DIAGNOSIS — M810 Age-related osteoporosis without current pathological fracture: Secondary | ICD-10-CM

## 2018-08-05 DIAGNOSIS — M7061 Trochanteric bursitis, right hip: Secondary | ICD-10-CM

## 2018-08-05 DIAGNOSIS — R5383 Other fatigue: Secondary | ICD-10-CM

## 2018-08-05 DIAGNOSIS — M1611 Unilateral primary osteoarthritis, right hip: Secondary | ICD-10-CM | POA: Diagnosis not present

## 2018-08-05 DIAGNOSIS — M4312 Spondylolisthesis, cervical region: Secondary | ICD-10-CM

## 2018-08-05 DIAGNOSIS — M5136 Other intervertebral disc degeneration, lumbar region: Secondary | ICD-10-CM

## 2018-08-05 DIAGNOSIS — Z8669 Personal history of other diseases of the nervous system and sense organs: Secondary | ICD-10-CM

## 2018-08-05 DIAGNOSIS — Z79899 Other long term (current) drug therapy: Secondary | ICD-10-CM

## 2018-08-05 DIAGNOSIS — G35 Multiple sclerosis: Secondary | ICD-10-CM

## 2018-08-05 DIAGNOSIS — S62101D Fracture of unspecified carpal bone, right wrist, subsequent encounter for fracture with routine healing: Secondary | ICD-10-CM | POA: Diagnosis not present

## 2018-08-05 DIAGNOSIS — M25562 Pain in left knee: Secondary | ICD-10-CM | POA: Diagnosis not present

## 2018-08-05 DIAGNOSIS — F5101 Primary insomnia: Secondary | ICD-10-CM

## 2018-08-05 DIAGNOSIS — G35D Multiple sclerosis, unspecified: Secondary | ICD-10-CM

## 2018-08-05 DIAGNOSIS — M25531 Pain in right wrist: Secondary | ICD-10-CM | POA: Diagnosis not present

## 2018-08-05 DIAGNOSIS — Z96642 Presence of left artificial hip joint: Secondary | ICD-10-CM

## 2018-08-05 DIAGNOSIS — Z8679 Personal history of other diseases of the circulatory system: Secondary | ICD-10-CM

## 2018-08-05 NOTE — Patient Instructions (Signed)
Standing Labs We placed an order today for your standing lab work.    Please come back and get your standing labs in June and every 3 months   We have open lab Monday through Friday from 8:30-11:30 AM and 1:30-4:00 PM  at the office of Dr. Tykeem Lanzer.   You may experience shorter wait times on Monday and Friday afternoons. The office is located at [address], [address], [place], [place] [address] No appointment is necessary.   Labs are drawn by [facility].  You may receive a bill from [facility] for your lab work.  If you wish to have your labs drawn at another location, please call the office 24 hours in advance to send orders.  If you have any questions regarding directions or hours of operation,  please call [phone].   Just as a reminder please drink plenty of water prior to coming for your lab work. Thanks!   

## 2018-08-06 LAB — CBC WITH DIFFERENTIAL/PLATELET
ABSOLUTE MONOCYTES: 539 {cells}/uL (ref 200–950)
Basophils Absolute: 12 cells/uL (ref 0–200)
Basophils Relative: 0.2 %
Eosinophils Absolute: 17 cells/uL (ref 15–500)
Eosinophils Relative: 0.3 %
HCT: 34.4 % — ABNORMAL LOW (ref 35.0–45.0)
Hemoglobin: 12 g/dL (ref 11.7–15.5)
Lymphs Abs: 609 cells/uL — ABNORMAL LOW (ref 850–3900)
MCH: 35.7 pg — ABNORMAL HIGH (ref 27.0–33.0)
MCHC: 34.9 g/dL (ref 32.0–36.0)
MCV: 102.4 fL — ABNORMAL HIGH (ref 80.0–100.0)
MPV: 9.3 fL (ref 7.5–12.5)
Monocytes Relative: 9.3 %
NEUTROS PCT: 79.7 %
Neutro Abs: 4623 cells/uL (ref 1500–7800)
Platelets: 362 10*3/uL (ref 140–400)
RBC: 3.36 10*6/uL — ABNORMAL LOW (ref 3.80–5.10)
RDW: 13.3 % (ref 11.0–15.0)
Total Lymphocyte: 10.5 %
WBC: 5.8 10*3/uL (ref 3.8–10.8)

## 2018-08-06 LAB — COMPLETE METABOLIC PANEL WITH GFR
AG Ratio: 1.9 (calc) (ref 1.0–2.5)
ALT: 16 U/L (ref 6–29)
AST: 25 U/L (ref 10–35)
Albumin: 4.1 g/dL (ref 3.6–5.1)
Alkaline phosphatase (APISO): 46 U/L (ref 37–153)
BUN: 10 mg/dL (ref 7–25)
CO2: 31 mmol/L (ref 20–32)
Calcium: 9.6 mg/dL (ref 8.6–10.4)
Chloride: 100 mmol/L (ref 98–110)
Creat: 0.88 mg/dL (ref 0.60–0.93)
GFR, Est African American: 74 mL/min/{1.73_m2} (ref >=60)
GFR, Est Non African American: 64 mL/min/{1.73_m2} (ref >=60)
Globulin: 2.2 g/dL (calc) (ref 1.9–3.7)
Glucose, Bld: 94 mg/dL (ref 65–99)
Potassium: 4.3 mmol/L (ref 3.5–5.3)
Sodium: 139 mmol/L (ref 135–146)
Total Bilirubin: 0.4 mg/dL (ref 0.2–1.2)
Total Protein: 6.3 g/dL (ref 6.1–8.1)

## 2018-08-06 LAB — LIPID PANEL
Cholesterol: 188 mg/dL (ref <200)
HDL: 72 mg/dL (ref >=50)
LDL Cholesterol (Calc): 96 mg/dL (calc)
Non-HDL Cholesterol (Calc): 116 mg/dL (calc) (ref <130)
Total CHOL/HDL Ratio: 2.6 (calc) (ref <5.0)
Triglycerides: 103 mg/dL (ref <150)

## 2018-08-06 NOTE — Progress Notes (Signed)
MCV is slightly elevated.  Which can happen due to folic acid deficiency.  Please make sure patient is taking folic acid on a regular basis.

## 2018-08-09 ENCOUNTER — Telehealth: Payer: Self-pay | Admitting: Internal Medicine

## 2018-08-09 NOTE — Telephone Encounter (Signed)
Copied from [place] [phone]. Topic: Quick Communication - Rx Refill/Question >> Aug 09, 2018 10:53 AM Selinda Flavin B, NT wrote: **patient states that she is needing a new prescription due to change in how often she is taking medication, per her pharmacy**   Medication: omeprazole (PRILOSEC) 40 MG capsule   Has the patient contacted their pharmacy? Yes.   (Agent: If no, request that the patient contact the pharmacy for the refill.) (Agent: If yes, when and what did the pharmacy advise?)  Preferred Pharmacy (with phone number or street name): [id number] - [place], [address]  Agent: Please be advised that RX refills may take up to 3 business days. We ask that you follow-up with your pharmacy.

## 2018-08-10 MED ORDER — OMEPRAZOLE 40 MG PO CPDR: DELAYED_RELEASE_CAPSULE | ORAL | 1 refills | Status: DC

## 2018-08-10 NOTE — Telephone Encounter (Signed)
Rx sent with the appropriate direction. Pt is aware.

## 2018-08-11 ENCOUNTER — Other Ambulatory Visit: Payer: Self-pay | Admitting: *Deleted

## 2018-08-11 MED ORDER — FOLIC ACID 1 MG PO TABS: 2.0000 mg | ORAL_TABLET | Freq: Every day | ORAL | 3 refills | Status: DC

## 2018-08-11 NOTE — Telephone Encounter (Signed)
Refill request received via fax  Last Visit: 08/05/18 Next Visit: 12/09/18  Okay to refill per Dr. Estanislado Pandy

## 2018-08-12 ENCOUNTER — Other Ambulatory Visit: Payer: Self-pay | Admitting: Rheumatology

## 2018-08-12 NOTE — Telephone Encounter (Signed)
Last Visit: 08/05/18 Next Visit: 12/09/18 Labs: 08/05/18 MCV is slightly elevated.   Okay to refill per Dr. Estanislado Pandy

## 2018-08-16 ENCOUNTER — Telehealth: Payer: Self-pay | Admitting: Rheumatology

## 2018-08-16 DIAGNOSIS — M0579 Rheumatoid arthritis with rheumatoid factor of multiple sites without organ or systems involvement: Secondary | ICD-10-CM

## 2018-08-16 MED ORDER — BARICITINIB 2 MG PO TABS: 2.0000 mg | ORAL_TABLET | Freq: Every day | ORAL | 0 refills | Status: DC

## 2018-08-16 NOTE — Telephone Encounter (Signed)
Patient called requesting prescription refill of Baricitinib to be sent to [facility].

## 2018-08-16 NOTE — Telephone Encounter (Signed)
Last Visit: 08/05/2018 Next Visit: 12/09/2018 Labs: 08/05/2018 MCV is slightly elevated. Which can happen due to folic acid deficiency.  Okay to refill per Dr. Estanislado Pandy.

## 2018-08-17 ENCOUNTER — Telehealth: Payer: Self-pay

## 2018-08-17 NOTE — Telephone Encounter (Signed)
Patient called stating she has not heard from [name] about olumiant refill. Patient states she cannot reach anyone by phone and I advised patient to come by the office tomorrow to pick up samples. Patient verbalized understanding and will come by tomorrow.

## 2018-08-18 NOTE — Telephone Encounter (Signed)
Medication Samples have been provided to the patient.  Drug name: Olumiant     Strength: 2mg         Qty: 1 bottle  LOT: [id number]  Exp.Date: 08/2020  Dosing instructions: Take 2mg  by mouth daily.   The patient has been instructed regarding the correct time, dose, and frequency of taking this medication, including desired effects and most common side effects.   Earnestine Mealing 9:55 AM 08/18/2018

## 2018-08-19 DIAGNOSIS — M25531 Pain in right wrist: Secondary | ICD-10-CM | POA: Diagnosis not present

## 2018-09-07 MED FILL — OLUMIANT 2 MG TABS: 2 | 30 days supply | Qty: 30 | Fill #0

## 2018-09-09 DIAGNOSIS — M25552 Pain in left hip: Secondary | ICD-10-CM | POA: Diagnosis not present

## 2018-09-09 DIAGNOSIS — M25531 Pain in right wrist: Secondary | ICD-10-CM | POA: Diagnosis not present

## 2018-09-10 ENCOUNTER — Telehealth: Payer: Self-pay | Admitting: Rheumatology

## 2018-09-10 NOTE — Telephone Encounter (Signed)
Attempted to contact patient on home number and number was busy. Attempted to contact patient on mobile number and left message for patient to call the office.

## 2018-09-10 NOTE — Telephone Encounter (Signed)
Patient left a message requesting a call back to discuss one of her medications.

## 2018-09-15 ENCOUNTER — Ambulatory Visit: Payer: Medicare Other | Admitting: Neurology

## 2018-09-22 NOTE — Progress Notes (Signed)
Virtual Visit via Video Note  I connected with Sherri [name] on 09/23/18 at 10:00 AM EDT by a video enabled telemedicine application and verified that I am speaking with the correct person using two identifiers.   I discussed the limitations of evaluation and management by telemedicine and the availability of in person appointments. The patient expressed understanding and agreed to proceed.  The patient is currently at home and I am in the office.    No referring provider.    History of Present Illness: She is here for follow up of her chronic medical conditions.   She is active around her house.      She fell the beginning of December and broke her arm.  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  She does follow with ortho.   Hypertension: She is taking her medication daily. She is compliant with a low sodium diet.  She denies chest pain, palpitations, shortness of breath and regular headaches. She does not monitor her blood pressure at home.    Hyperlipidemia: She is taking her medication daily. She is compliant with a low fat/cholesterol diet. She denies myalgias.   GERD:  She is taking her medication daily as prescribed.  She denies any GERD symptoms and feels her GERD is well controlled.   RA - she is following with rheumatology.  She is on a new medication for her RA.    MS:  She follows with neurology.  She feels her MS is under control.     Review of Systems  Constitutional: Negative for chills and fever.  Respiratory: Negative for cough, shortness of breath and wheezing.   Cardiovascular: Positive for leg swelling (mild). Negative for chest pain and palpitations.  Musculoskeletal: Positive for joint pain.  Neurological: Positive for headaches (occ). Negative for dizziness.      Social History   Socioeconomic History  . Marital status: Married    Spouse name: Not on file  . Number of children: Not on file  .  Years of education: Not on file  . Highest education level: Not on file  Occupational History  . Occupation: retired  Scientific laboratory technician  . Financial resource strain: Not on file  . Food insecurity:    Worry: Not on file    Inability: Not on file  . Transportation needs:    Medical: Not on file    Non-medical: Not on file  Tobacco Use  . Smoking status: Former Smoker    Packs/day: 2.50    Years: 20.00    Pack years: 50.00    Types: Cigarettes    Last attempt to quit: 05/26/1981    Years since quitting: 37.3  . Smokeless tobacco: Never Used  . Tobacco comment: Smoked [phone], up to 2.5 ppd  Substance and Sexual Activity  . Alcohol use: Yes    Alcohol/week: 14.0 standard drinks    Types: 14 Cans of beer per week    Comment: 4% beer  . Drug use: Never  . Sexual activity: Yes  Lifestyle  . Physical activity:    Days per week: Not on file    Minutes per session: Not on file  . Stress: Not on file  Relationships  . Social connections:    Talks on phone: Not on file    Gets together: Not on file    Attends religious service: Not on file    Active member of club or organization:  Not on file    Attends meetings of clubs or organizations: Not on file    Relationship status: Not on file  Other Topics Concern  . Not on file  Social History Narrative  . Not on file     Observations/Objective: Appears well in NAD Normal mood and affect  Temp 97.6 BP 123/76 Pulse 72 Weight 112  Assessment and Plan:  See Problem List for Assessment and Plan of chronic medical problems.   Follow Up Instructions:    I discussed the assessment and treatment plan with the patient. The patient was provided an opportunity to ask questions and all were answered. The patient agreed with the plan and demonstrated an understanding of the instructions.   The patient was advised to call back or seek an in-person evaluation if the symptoms worsen or if the condition fails to improve as  anticipated.  FU in 6 months  Binnie Rail, MD

## 2018-09-23 ENCOUNTER — Ambulatory Visit (INDEPENDENT_AMBULATORY_CARE_PROVIDER_SITE_OTHER): Payer: Medicare Other | Admitting: Internal Medicine

## 2018-09-23 ENCOUNTER — Encounter: Payer: Self-pay | Admitting: Internal Medicine

## 2018-09-23 DIAGNOSIS — E7849 Other hyperlipidemia: Secondary | ICD-10-CM

## 2018-09-23 DIAGNOSIS — K219 Gastro-esophageal reflux disease without esophagitis: Secondary | ICD-10-CM | POA: Diagnosis not present

## 2018-09-23 DIAGNOSIS — I10 Essential (primary) hypertension: Secondary | ICD-10-CM | POA: Diagnosis not present

## 2018-09-23 NOTE — Assessment & Plan Note (Signed)
BP Readings from Last 3 Encounters:  08/05/18 121/77  05/04/18 131/72  04/14/18 133/83   BP well controlled in past Current regimen effective and well tolerated Continue current medications at current doses

## 2018-09-23 NOTE — Assessment & Plan Note (Signed)
GERD controlled Continue daily medication  

## 2018-09-23 NOTE — Assessment & Plan Note (Signed)
Continue statin. 

## 2018-09-25 ENCOUNTER — Encounter: Payer: Self-pay | Admitting: Internal Medicine

## 2018-09-25 ENCOUNTER — Encounter: Payer: Self-pay | Admitting: Rheumatology

## 2018-09-25 ENCOUNTER — Ambulatory Visit (HOSPITAL_COMMUNITY)
Admission: EM | Admit: 2018-09-25 | Discharge: 2018-09-25 | Disposition: A | Discharge status: Home / Self Care | Payer: Medicare Other | Attending: Emergency Medicine | Admitting: Emergency Medicine

## 2018-09-25 DIAGNOSIS — L03114 Cellulitis of left upper limb: Secondary | ICD-10-CM

## 2018-09-25 MED ORDER — CLINDAMYCIN HCL 300 MG PO CAPS: 300.0000 mg | ORAL_CAPSULE | Freq: Three times a day (TID) | ORAL | 0 refills | Status: AC

## 2018-09-25 NOTE — ED Provider Notes (Signed)
[facility]    CSN: [id number] Arrival date & time: 09/25/18  1749     History   Chief Complaint Chief Complaint  Patient presents with  . Recurrent Skin Infections    HPI Sherri [name] is a 78 y.o. female history of hypertension, RA, osteoporosis presenting today for evaluation of left forearm redness and concern for infection.  Patient states that over the past 24 to 48 hours she has developed increasing redness and discomfort to her left forearm.  She does note that 1.5 weeks ago she believes she was scratched by her cat.  She is not 100% sure that it was not the teeth that caused the small puncture marks.  She did not have any immediate complications and was not evaluated.  She denies any fevers.  Denies any numbness or tingling.  Denies any difficulty moving hands or wrist, elbow.  Notes that she has normal discomfort with rheumatoid arthritis, but this is not significantly increased from normal.  HPI  Past Medical History:  Diagnosis Date  . Anxiety    takes Valium daily as needed  . Bruises easily    d/t meds  . Cancer ([place])    basal cell ca, in situ- uterine   . Cataracts, bilateral   . Chronic back pain   . Dizziness    r/t to meds  . GERD (gastroesophageal reflux disease)    no meds on a regular basis but will take Tums if needed  . Headache(784.0)    r/t neck issues  . History of bronchitis 6-40yrs ago  . History of colon polyps    benign  . Hyperlipidemia    takes Atorvastatin on Mondays and Fridays  . Hypertension    takes Ramipril daily  . Joint pain   . Joint swelling   . Multiple sclerosis ([place])   . Neuromuscular disorder ([place])    Dr. Park Liter- [facility], follows [facility]  . Nocturia   . OA (osteoarthritis)    Dr Jani Gravel weekly, RA- hands- knees- feet   . PONV (postoperative nausea and vomiting)    trouble urinating after surgery in 2014  . Rheumatoid arthritis(714.0)    Dr Estanislado Pandy takes [name] daily  . Right  wrist fracture   . Skin cancer   . Urinary retention    sees Dr.Wrenn about 2 times a yr    Patient Active Problem List   Diagnosis Date Noted  . Right sided sciatica 04/14/2018  . Trochanteric bursitis of right hip 04/14/2018  . S/P cervical spinal fusion 11/10/2017  . Swelling of left foot 10/26/2017  . Dysphagia 10/26/2017  . Left ankle swelling 10/26/2017  . Localized swelling, mass, and lump of head 08/18/2017  . Gastroesophageal reflux disease 08/06/2017  . Easy bruising 08/06/2017  . Anxiety 04/24/2017  . Mid back pain 03/26/2017  . Left sided chest pain 03/25/2017  . Dyspnea on exertion 03/14/2017  . Hyperkalemia 03/13/2017  . Submandibular gland swelling 01/08/2017  . High risk medication use 09/02/2016  . Urinary frequency 09/02/2016  . Piriformis muscle pain 09/02/2016  . Fatigue 07/22/2016  . Hair loss 07/22/2016  . Right hip pain 04/08/2016  . Right knee pain 02/25/2016  . Dyspepsia 04/05/2015  . Numbness 11/14/2014  . Biceps tendon tear 10/10/2014  . Insomnia 10/10/2014  . Gait disturbance 06/13/2014  . 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 OP (osteoporosis) 01/18/2014  . Other long term (current) drug therapy 01/18/2014  . Other dysphagia 12/29/2013  . Hoarseness 12/29/2013  .  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 Personal history of colonic polyps 07/03/2010  . Nocturia 05/28/2010  . THYROID NODULE 05/17/2008  . Multiple sclerosis ([place]) 05/17/2008  . Chronic fatigue 03/30/2007  . Hyperlipidemia 08/20/2006  . Essential hypertension 08/20/2006  . Rheumatoid arthritis ([place]) 08/20/2006  . CERVICAL CANCER, HX OF 08/20/2006  . GASTRIC ULCER, HX OF 08/20/2006    Past Surgical History:  Procedure Laterality Date  . ABDOMINAL HYSTERECTOMY     1985  . APPENDECTOMY     with TAH  . BACK SURGERY     several  . BREAST BIOPSY Right    Results were negative  . BROW LIFT Bilateral 10/02/2016   Procedure: BILATERAL LOWER LID  BLEPHAROPLASTY;  Surgeon: Clista Bernhardt, MD;  Location: [facility];  Service: Plastics;  Laterality: Bilateral;  . CARPAL TUNNEL RELEASE Right   . cataracts    . CERVICAL FUSION     Dr Joya Salm  . CERVICAL FUSION  12/31/2012   Dr Joya Salm  . CHEST TUBE INSERTION     for traumatic Pneumothorax  . COLONOSCOPY  07/16/2010   normal   . COLONOSCOPY W/ POLYPECTOMY  1997   negative since; Dr Deatra Ina  . ESOPHAGOGASTRODUODENOSCOPY  07/16/2010   normal  . eye lid raise    . EYE SURGERY Bilateral    cataracts removed - /w IOL  . JOINT REPLACEMENT    . LUMBAR FUSION     Dr Joya Salm  . NASAL SINUS SURGERY    . POSTERIOR CERVICAL FUSION/FORAMINOTOMY N/A 12/31/2012   Procedure: POSTERIOR LATERAL CERVICAL FUSION/FORAMINOTOMY LEVEL 1 CERVICAL THREE-FOUR WITH LATERAL MASS SCREWS;  Surgeon: Floyce Stakes, MD;  Location: [facility];  Service: Neurosurgery;  Laterality: N/A;  . PTOSIS REPAIR Bilateral 10/02/2016   Procedure: INTERNAL PTOSIS REPAIR;  Surgeon: Clista Bernhardt, MD;  Location: [facility];  Service: Plastics;  Laterality: Bilateral;  . SKIN BIOPSY    . THYROID SURGERY     R lobe removed- 1972, has grown back - CT last done- 2018  . TOTAL HIP ARTHROPLASTY  12/22/2011   Procedure: TOTAL HIP ARTHROPLASTY;  Surgeon: Kerin Salen, MD;  Location: [facility];  Service: Orthopedics;  Laterality: Left;  . TOTAL SHOULDER ARTHROPLASTY    . TUBAL LIGATION      OB History   No obstetric history on file.      Home Medications    Prior to Admission medications   Medication Sig Start Date End Date Taking? Authorizing Provider  atorvastatin (LIPITOR) 10 MG tablet TAKE 1 TABLET BY MOUTH ON MONDAYS AND FRIDAY -- OFFICE VISIT NEEDED FOR FURTHER REFILLS 08/04/18   Binnie Rail, MD  Baricitinib (OLUMIANT) 2 MG TABS Take 2 mg by mouth daily. 08/16/18   Bo Merino, MD  Biotin 5000 MCG TABS Take 5,000 mcg by mouth daily.     [provider]  Calcium Carbonate-Vitamin D (CALCIUM + D PO) Take 1 tablet  by mouth daily.     [provider]  Carboxymethylcellul-Glycerin (LUBRICATING EYE DROPS OP) Apply 1 drop to eye daily as needed (dry eyes).    [provider]  carvedilol (COREG) 6.25 MG tablet TAKE 1 TABLET (6.25 MG TOTAL) BY MOUTH 2 (TWO) TIMES DAILY WITH A MEAL. 04/15/18   Binnie Rail, MD  Cholecalciferol (VITAMIN D3) 2000 units capsule Take 2,000 Units by mouth daily.    [provider]  clindamycin (CLEOCIN) 300 MG capsule Take 1 capsule (300 mg total) by  mouth 3 (three) times daily for 7 days. 09/25/18 10/02/18  Jenesis Suchy C, PA-C  Cyanocobalamin (VITAMIN B-12 PO) Take 1 tablet by mouth daily.    [provider]  denosumab (PROLIA) 60 MG/ML SOSY injection Inject 60 mg into the skin every 6 (six) months. 03/24/18   Bo Merino, MD  diazepam (VALIUM) 5 MG tablet Take 1 tablet (5 mg total) by mouth at bedtime. Patient taking differently: Take 5 mg by mouth at bedtime as needed.  04/14/18   Sater, Nanine Means, MD  diclofenac sodium (VOLTAREN) 1 % GEL APPLY 3 GRAMS TO 3 LARGE JOINTS 3 TIMES A DAY AS NEEDED 01/27/18   Bo Merino, MD  folic acid (FOLVITE) 1 MG tablet Take 2 tablets (2 mg total) by mouth daily. 08/11/18   Bo Merino, MD  furosemide (LASIX) 20 MG tablet Take 1 tablet (20 mg total) by mouth daily. -- Office visit needed for further refills 07/09/18   Binnie Rail, MD  methotrexate (RHEUMATREX) 2.5 MG tablet TAKE 6 TABLETS BY MOUTH ONCE WEEKLY. CAUTION:CHEMOTHERAPY. PROTECT FROM LIGHT. 08/12/18   Bo Merino, MD  omeprazole (PRILOSEC) 40 MG capsule Take 1 tablet (40 mg total) once daily. 08/10/18   Burns, Claudina Lick, MD  predniSONE (DELTASONE) 1 MG tablet TAKE 4 TABLETS (4 MG TOTAL) BY MOUTH DAILY WITH BREAKFAST. 04/09/18 08/05/18  Bo Merino, MD  Pyridoxine HCl (VITAMIN B-6 PO) Take 1 tablet by mouth daily.    [provider]  traMADol Veatrice Bourbon) 50 MG tablet Take one tablet by mouth twice a day 04/14/18   Sater,  Nanine Means, MD    Family History Family History  Problem Relation Age of Onset  . Cancer Mother        pancreatic  . Diabetes Mother   . Heart disease Father        Rheumatic  . Cancer Father        ? stomach  . Cancer Sister        stomach  . Cancer Maternal Aunt        X 4; ? primary  . Heart disease Paternal Aunt   . Cancer Maternal Grandmother        cervical  . Colon cancer Other        Aunts  . Multiple sclerosis Daughter   . Hypertension Neg Hx   . Stroke Neg Hx     Social History Social History   Tobacco Use  . Smoking status: Former Smoker    Packs/day: 2.50    Years: 20.00    Pack years: 50.00    Types: Cigarettes    Last attempt to quit: 05/26/1981    Years since quitting: 37.3  . Smokeless tobacco: Never Used  . Tobacco comment: Smoked [phone], up to 2.5 ppd  Substance Use Topics  . Alcohol use: Yes    Alcohol/week: 14.0 standard drinks    Types: 14 Cans of beer per week    Comment: 4% beer  . Drug use: Never     Allergies   Darvon [propoxyphene hcl]; Meperidine hcl; Penicillins; Azathioprine; Leflunomide; and Ezetimibe-simvastatin   Review of Systems Review of Systems  Constitutional: Negative for fatigue and fever.  Eyes: Negative for visual disturbance.  Respiratory: Negative for shortness of breath.   Cardiovascular: Negative for chest pain.  Gastrointestinal: Negative for abdominal pain, nausea and vomiting.  Musculoskeletal: Negative for arthralgias and joint swelling.  Skin: Positive for color change and rash. Negative for wound.  Neurological: Negative for  dizziness, weakness, light-headedness and headaches.     Physical Exam Triage Vital Signs ED Triage Vitals  Enc Vitals Group     BP 09/25/18 1801 (!) 141/65     Pulse Rate 09/25/18 1801 71     Resp 09/25/18 1801 16     Temp 09/25/18 1801 97.9 F (36.6 C)     Temp Source 09/25/18 1801 Oral     SpO2 09/25/18 1801 100 %     Weight --      Height --      Head  Circumference --      Peak Flow --      Pain Score 09/25/18 1802 5     Pain Loc --      Pain Edu? --      Excl. in [employer]? --    No data found.  Updated Vital Signs BP (!) 141/65 (BP Location: Right Arm)   Pulse 71   Temp 97.9 F (36.6 C) (Oral)   Resp 16   SpO2 100%   Visual Acuity Right Eye Distance:   Left Eye Distance:   Bilateral Distance:    Right Eye Near:   Left Eye Near:    Bilateral Near:     Physical Exam Vitals signs and nursing note reviewed.  Constitutional:      General: She is not in acute distress.    Appearance: She is well-developed.  HENT:     Head: Normocephalic and atraumatic.  Eyes:     Conjunctiva/sclera: Conjunctivae normal.  Neck:     Musculoskeletal: Neck supple.  Cardiovascular:     Rate and Rhythm: Normal rate and regular rhythm.     Heart sounds: No murmur.  Pulmonary:     Effort: Pulmonary effort is normal. No respiratory distress.     Breath sounds: Normal breath sounds.  Musculoskeletal:     Comments: Full active range of motion of elbow, wrist and fingers, radial pulse 2+, cap refill less than 2 seconds  Skin:    General: Skin is warm and dry.     Comments: See pictures below, 15 cm area of erythema extending along radial surface, surrounding purple discoloration, 2 very small superficial abrasions noted within erythema; erythema more faint on anterior aspect of forearm  Neurological:     Mental Status: She is alert.          UC Treatments / Results  Labs (all labs ordered are listed, but only abnormal results are displayed) Labs Reviewed - No data to display  EKG None  Radiology No results found.  Procedures Procedures (including critical care time)  Medications Ordered in UC Medications - No data to display  Initial Impression / Assessment and Plan / UC Course  I have reviewed the triage vital signs and the nursing notes.  Pertinent labs & imaging results that were available during my care of the patient  were reviewed by me and considered in my medical decision making (see chart for details).     Patient appears to have a cellulitis, likely secondary to scratch from 1 week ago.  Unclear if this was caused by cat scratch versus bite.  Has allergy to penicillins causing hives.  Will provide alternative to Augmentin.  Discussed using clindamycin as alternative given patient allergy and unsure if initiated from cat mouth pathogens.  Patient notes that she believes this medicine causes her stomach discomfort.  Through shared decision making opted to proceed with clindamycin, advised that if she has too much stomach  discomfort that we could switch to doxycycline.  Given this has spread quickly in the past couple days will recommend follow-up with her PCP or here in 2 days, follow-up in emergency room if things worsening or not improving.  Also to follow-up in emergency room if developing fevers, difficulty moving elbow or wrist, numbness or tingling. Discussed strict return precautions. Patient verbalized understanding and is agreeable with plan.  Final Clinical Impressions(s) / UC Diagnoses   Final diagnoses:  Cellulitis of left upper extremity     Discharge Instructions     Please begin taking clindamycin every 8 hours over the next week Please follow-up here with primary care for wound check to ensure symptoms are improving in 2 days If you have worsening pain, stiffness in elbow or wrist, increased redness spreading further up the arm, fevers please follow-up in the emergency room.  Please also follow-up in the emergency room if not having any improvement in 48 to 72 hours.    ED Prescriptions    Medication Sig Dispense Auth. Provider   clindamycin (CLEOCIN) 300 MG capsule Take 1 capsule (300 mg total) by mouth 3 (three) times daily for 7 days. 21 capsule Emalyn Schou C, PA-C     Controlled Substance Prescriptions [place] Controlled Substance Registry consulted? Not Applicable   Janith Lima, Sherri 09/26/18 [phone]

## 2018-09-25 NOTE — ED Triage Notes (Signed)
Per pt about 1 week ago she was pricked by her cat and then noticed yesterday that she has a red area on her left forearm that is swollen and warm  to  The touch. Py can wiggle fingers and good pulse.

## 2018-09-25 NOTE — Discharge Instructions (Signed)
Please begin taking clindamycin every 8 hours over the next week Please follow-up here with primary care for wound check to ensure symptoms are improving in 2 days If you have worsening pain, stiffness in elbow or wrist, increased redness spreading further up the arm, fevers please follow-up in the emergency room.  Please also follow-up in the emergency room if not having any improvement in 48 to 72 hours.

## 2018-09-27 ENCOUNTER — Encounter: Payer: Self-pay | Admitting: Rheumatology

## 2018-09-27 ENCOUNTER — Ambulatory Visit: Payer: Medicare Other | Admitting: Orthopaedic Surgery

## 2018-09-27 ENCOUNTER — Other Ambulatory Visit: Payer: Self-pay | Admitting: Internal Medicine

## 2018-09-27 ENCOUNTER — Telehealth (INDEPENDENT_AMBULATORY_CARE_PROVIDER_SITE_OTHER): Payer: Medicare Other | Admitting: Rheumatology

## 2018-09-27 ENCOUNTER — Encounter: Payer: Self-pay | Admitting: Orthopaedic Surgery

## 2018-09-27 ENCOUNTER — Other Ambulatory Visit: Payer: Self-pay

## 2018-09-27 ENCOUNTER — Telehealth: Payer: Self-pay | Admitting: Rheumatology

## 2018-09-27 VITALS — Ht 62.0 in | Wt 114.0 lb

## 2018-09-27 DIAGNOSIS — G35 Multiple sclerosis: Secondary | ICD-10-CM

## 2018-09-27 DIAGNOSIS — M0579 Rheumatoid arthritis with rheumatoid factor of multiple sites without organ or systems involvement: Secondary | ICD-10-CM

## 2018-09-27 DIAGNOSIS — S62101D Fracture of unspecified carpal bone, right wrist, subsequent encounter for fracture with routine healing: Secondary | ICD-10-CM

## 2018-09-27 DIAGNOSIS — L03113 Cellulitis of right upper limb: Secondary | ICD-10-CM

## 2018-09-27 DIAGNOSIS — M810 Age-related osteoporosis without current pathological fracture: Secondary | ICD-10-CM

## 2018-09-27 DIAGNOSIS — M1611 Unilateral primary osteoarthritis, right hip: Secondary | ICD-10-CM

## 2018-09-27 DIAGNOSIS — L03114 Cellulitis of left upper limb: Secondary | ICD-10-CM | POA: Diagnosis not present

## 2018-09-27 DIAGNOSIS — Z96642 Presence of left artificial hip joint: Secondary | ICD-10-CM

## 2018-09-27 DIAGNOSIS — M4312 Spondylolisthesis, cervical region: Secondary | ICD-10-CM

## 2018-09-27 DIAGNOSIS — M5136 Other intervertebral disc degeneration, lumbar region: Secondary | ICD-10-CM

## 2018-09-27 DIAGNOSIS — Z8669 Personal history of other diseases of the nervous system and sense organs: Secondary | ICD-10-CM

## 2018-09-27 DIAGNOSIS — M7061 Trochanteric bursitis, right hip: Secondary | ICD-10-CM

## 2018-09-27 DIAGNOSIS — Z79899 Other long term (current) drug therapy: Secondary | ICD-10-CM

## 2018-09-27 DIAGNOSIS — R5383 Other fatigue: Secondary | ICD-10-CM

## 2018-09-27 NOTE — Telephone Encounter (Signed)
Patient is scheduled for a virtual visit today with Dr. Deveshwar to discuss.  

## 2018-09-27 NOTE — Telephone Encounter (Signed)
FYI: Patient saw Dr. Ninfa Linden, and he said oral antibiotics were working. To continue with those, and he will see her back this coming Monday. Patient wanted to thank you for getting her in so quickly for an appt.

## 2018-09-27 NOTE — Progress Notes (Signed)
Office Visit Note   Patient: Sherri [name]           Date of Birth: September 09, 1940           MRN: [id number] Visit Date: 09/27/2018              Requested by: Binnie Rail, MD [place], [place] [id number] PCP: Binnie Rail, MD   Assessment & Plan: Visit Diagnoses:  1. Cellulitis of left arm     Plan: She was given a 7-day course of clindamycin and I do feel that she should complete this.  Thus far does not look like she needs any type of surgical intervention or admission due to her left arm cellulitis.  More this appears to be skin changes and bruising consistent with also being on prednisone.  This does not appear to be worrisome for any type of deep infection or any type of need for surgical intervention.  All questions concerns were answered and addressed.  We will see her back in a week from now make sure everything is heading in the right direction still.  Obviously if there is any issues before then she will let us know.  Follow-Up Instructions: Return in about 1 week (around 10/04/2018).   Orders:  No orders of the defined types were placed in this encounter.  No orders of the defined types were placed in this encounter.     Procedures: No procedures performed   Clinical Data: No additional findings.   Subjective: Chief Complaint  Patient presents with  . Left Forearm - Pain    DOI 09/22/2018  The patient is on a sling for the first time but she has been seen apparently by our rheumatologist.  She is being seen acutely for right forearm redness and possible cellulitis.  She was actually seen 2 days ago in the [facility] urgent care ED and there are photographs of her arm of the system.  She is someone is on chronic prednisone.  She developed warmth and redness in her arm after possibly her cat "pricked her".  She has never had any fever, chills, nausea, vomiting.  She has not had a temperature.  She was started on clindamycin since she has an allergy to  penicillin.  This was just 2 days ago.  She says that her swelling and skin discoloration on the left arm have improved significantly.  She only reports a little bit of pain around her elbow but that was there before this happened due to her rheumatologic disease and likely tennis elbow in the past.  HPI  Review of Systems She currently denies any headache, chest pain, short of breath, fever, chills, nausea, vomiting  Objective: Vital Signs: Ht 5\' 2"  (1.575 m)   Wt 114 lb (51.7 kg)   BMI 20.85 kg/m   Physical Exam She is alert and orient x3 and in no acute distress Ortho Exam Examination of her left elbow forearm and wrist does show bruising and discoloration along the dorsal forearm.  There is slight warmth to this but there is no fluctuance.  There is no crepitation.  She moves her elbow wrist hand and fingers easily.  She has palpable pulses in her wrist.  There is no draining wounds.  There does not appear to be any type of abscess.  He can see markings on her skin where she was marked at the margins of her cellulitis and this has receded significantly over just 2 days.  Specialty Comments:  No specialty comments available.  Imaging: No results found.   PMFS History: Patient Active Problem List   Diagnosis Date Noted  . Right sided sciatica 04/14/2018  . Trochanteric bursitis of right hip 04/14/2018  . S/P cervical spinal fusion 11/10/2017  . Swelling of left foot 10/26/2017  . Dysphagia 10/26/2017  . Left ankle swelling 10/26/2017  . Localized swelling, mass, and lump of head 08/18/2017  . Gastroesophageal reflux disease 08/06/2017  . Easy bruising 08/06/2017  . Anxiety 04/24/2017  . Mid back pain 03/26/2017  . Left sided chest pain 03/25/2017  . Dyspnea on exertion 03/14/2017  . Hyperkalemia 03/13/2017  . Submandibular gland swelling 01/08/2017  . High risk medication use 09/02/2016  . Urinary frequency 09/02/2016  . Piriformis muscle pain 09/02/2016  . Fatigue  07/22/2016  . Hair loss 07/22/2016  . Right hip pain 04/08/2016  . Right knee pain 02/25/2016  . Dyspepsia 04/05/2015  . Numbness 11/14/2014  . Biceps tendon tear 10/10/2014  . Insomnia 10/10/2014  . Gait disturbance 06/13/2014  . 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 OP (osteoporosis) 01/18/2014  . Other long term (current) drug therapy 01/18/2014  . Other dysphagia 12/29/2013  . Hoarseness 12/29/2013  . 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 Personal history of colonic polyps 07/03/2010  . Nocturia 05/28/2010  . THYROID NODULE 05/17/2008  . Multiple sclerosis ([place]) 05/17/2008  . Chronic fatigue 03/30/2007  . Hyperlipidemia 08/20/2006  . Essential hypertension 08/20/2006  . Rheumatoid arthritis ([place]) 08/20/2006  . CERVICAL CANCER, HX OF 08/20/2006  . GASTRIC ULCER, HX OF 08/20/2006   Past Medical History:  Diagnosis Date  . Anxiety    takes Valium daily as needed  . Bruises easily    d/t meds  . Cancer ([place])    basal cell ca, in situ- uterine   . Cataracts, bilateral   . Chronic back pain   . Dizziness    r/t to meds  . GERD (gastroesophageal reflux disease)    no meds on a regular basis but will take Tums if needed  . Headache(784.0)    r/t neck issues  . History of bronchitis 6-53yrs ago  . History of colon polyps    benign  . Hyperlipidemia    takes Atorvastatin on Mondays and Fridays  . Hypertension    takes Ramipril daily  . Joint pain   . Joint swelling   . Multiple sclerosis ([place])   . Neuromuscular disorder ([place])    Dr. Park Liter- [facility], follows M.S.  . Nocturia   . OA (osteoarthritis)    Dr Jani Gravel weekly, RA- hands- knees- feet   . PONV (postoperative nausea and vomiting)    trouble urinating after surgery in 2014  . Rheumatoid arthritis(714.0)    Dr Estanislado Pandy takes [name] daily  . Right wrist fracture   . Skin cancer   . Urinary retention    sees Dr.Wrenn about 2 times a yr    Family History  Problem  Relation Age of Onset  . Cancer Mother        pancreatic  . Diabetes Mother   . Heart disease Father        Rheumatic  . Cancer Father        ? stomach  . Cancer Sister        stomach  . Cancer Maternal Aunt        X 4; ? primary  . Heart disease Paternal Aunt   .  Cancer Maternal Grandmother        cervical  . Colon cancer Other        Aunts  . Multiple sclerosis Daughter   . Hypertension Neg Hx   . Stroke Neg Hx     Past Surgical History:  Procedure Laterality Date  . ABDOMINAL HYSTERECTOMY     1985  . APPENDECTOMY     with TAH  . BACK SURGERY     several  . BREAST BIOPSY Right    Results were negative  . BROW LIFT Bilateral 10/02/2016   Procedure: BILATERAL LOWER LID BLEPHAROPLASTY;  Surgeon: Clista Bernhardt, MD;  Location: [facility];  Service: Plastics;  Laterality: Bilateral;  . CARPAL TUNNEL RELEASE Right   . cataracts    . CERVICAL FUSION     Dr Joya Salm  . CERVICAL FUSION  12/31/2012   Dr Joya Salm  . CHEST TUBE INSERTION     for traumatic Pneumothorax  . COLONOSCOPY  07/16/2010   normal   . COLONOSCOPY W/ POLYPECTOMY  1997   negative since; Dr Deatra Ina  . ESOPHAGOGASTRODUODENOSCOPY  07/16/2010   normal  . eye lid raise    . EYE SURGERY Bilateral    cataracts removed - /w IOL  . JOINT REPLACEMENT    . LUMBAR FUSION     Dr Joya Salm  . NASAL SINUS SURGERY    . POSTERIOR CERVICAL FUSION/FORAMINOTOMY N/A 12/31/2012   Procedure: POSTERIOR LATERAL CERVICAL FUSION/FORAMINOTOMY LEVEL 1 CERVICAL THREE-FOUR WITH LATERAL MASS SCREWS;  Surgeon: Floyce Stakes, MD;  Location: [facility];  Service: Neurosurgery;  Laterality: N/A;  . PTOSIS REPAIR Bilateral 10/02/2016   Procedure: INTERNAL PTOSIS REPAIR;  Surgeon: Clista Bernhardt, MD;  Location: [facility];  Service: Plastics;  Laterality: Bilateral;  . SKIN BIOPSY    . THYROID SURGERY     R lobe removed- 1972, has grown back - CT last done- 2018  . TOTAL HIP ARTHROPLASTY  12/22/2011   Procedure: TOTAL HIP ARTHROPLASTY;   Surgeon: Kerin Salen, MD;  Location: [place] [facility];  Service: Orthopedics;  Laterality: Left;  . TOTAL SHOULDER ARTHROPLASTY    . TUBAL LIGATION     Social History   Occupational History  . Occupation: retired  Tobacco Use  . Smoking status: Former Smoker    Packs/day: 2.50    Years: 20.00    Pack years: 50.00    Types: Cigarettes    Last attempt to quit: 05/26/1981    Years since quitting: 37.3  . Smokeless tobacco: Never Used  . Tobacco comment: Smoked [phone], up to 2.5 ppd  Substance and Sexual Activity  . Alcohol use: Yes    Alcohol/week: 14.0 standard drinks    Types: 14 Cans of beer per week    Comment: 4% beer  . Drug use: Never  . Sexual activity: Yes

## 2018-09-27 NOTE — Progress Notes (Signed)
Virtual Visit via Video Note  I connected with [place] on 09/27/18 at  9:30 AM EDT by a video enabled telemedicine application and verified that I am speaking with the correct person using two identifiers.  Location: Patient: Home Provider:at the office  This service was conducted via virtual visit.  Both audio and visual tools were used.  The patient was located at home. I was located in my office.  Consent was obtained prior to the virtual visit and is aware of possible charges through their insurance for this visit.  The patient is an established patient.  Dr. Estanislado Pandy, MD conducted the virtual visit and Hazel Sams, PA-C acted as scribe during the service.  Office staff helped with scheduling follow up visits after the service was conducted.  I discussed the limitations of evaluation and management by telemedicine and the availability of in person appointments. The patient expressed understanding and agreed to proceed.  CC: Right arm pain   History of Present Illness: Patient is a 78 year old female with a past medical history of seropositive rheumatoid arthritis and osteoarthritis. She is taking Olumiant 2 mg po daily, MTX 6 tablets po weekly, and folic acid 1 mg po daily. She continues to take prednisone 4 mg po daily.  She reports she had a cat bite on her right arm, and she was started on Clindamycin 300 mg TID for 7 days.  She states the swelling has improved but she continues to have significant pain and redness from wrist to elbow.  She denies any fevers.    Review of Systems  Constitutional: Negative for fever and malaise/fatigue.  HENT:       +Dry mouth  Eyes: Negative for photophobia, pain, discharge and redness.       +Dry eyes  Respiratory: Negative for cough, shortness of breath and wheezing.   Cardiovascular: Negative for chest pain and palpitations.  Gastrointestinal: Negative for blood in stool, constipation and diarrhea.  Genitourinary: Negative for dysuria.   Musculoskeletal: Positive for joint pain. Negative for back pain, myalgias and neck pain.  Skin: Negative for rash.  Neurological: Negative for dizziness and headaches.  Psychiatric/Behavioral: Negative for depression. The patient is not nervous/anxious and does not have insomnia.       Observations/Objective: Physical Exam  Constitutional: She is oriented to person, place, and time and well-developed, well-nourished, and in no distress.  HENT:  Head: Normocephalic and atraumatic.  Eyes: Conjunctivae are normal.  Pulmonary/Chest: Effort normal.  Neurological: She is alert and oriented to person, place, and time.  Psychiatric: Mood, memory, affect and judgment normal.   Patient reports morning stiffness for 2-3 hours.   Patient reports nocturnal pain.  Difficulty dressing/grooming: Denies Difficulty climbing stairs: Denies Difficulty getting out of chair: Denies Difficulty using hands for taps, buttons, cutlery, and/or writing: Reports   Assessment and Plan: Rheumatoid arthritis involving multiple sites with positive rheumatoid factor (HCC)-She has not had any recent rheumatoid arthritis flares.  She is clinically doing better on Oluminant 2 mg po daily, MTX 6 tablets po once weekly, and folic acid 1 mg po daily. She continues to take prednisone 4 mg po daily and she is unable to reduce this dose.  She reports this past weekend she was evaluated at Urgent care and was diagnosed with cellulitis of the right arm following a cat bite this weekend, and she was started on Clindamycin 300 mg TID for 7 days.  She has noticed less swelling since starting on Clindamycin but continues  to have erythema extending from her right elbow to the right wrist.  She is afebrile at this time.  She was advised to hold MTX and Oluminant until the infection has cleared.  She will require an urgent appointment with an orthopedic surgeon for further evaluation. She will continue taking prednisone 4 mg daily.  She  was advised to notify us if she develops increased joint pain or joint swelling.  She will follow up in 2 months.      High risk medication use -Olumiant 2 mg daily (started in October 2019), methotrexate 6 tablets weekly, and folic acid 1 mg daily.  She is taking Prednisone 4 mg po daily.  Last TB gold negative on 08/31/2017.  A future order for TB gold was placed today. CBC, CMP, and lipid panel were drawn on 08/05/18.  She will be due to update CBC and CMP in June and every 3 months.  Standing orders are in place. She was advised to hold Oluminant and MTX if she develops an infection and she can resume once the infection has cleared. We discussed the importance of social distancing and following standard precautions recommended by the CDC.   Age-related osteoporosis without current pathological fracture - She is currently on Prolia 60 mg every 6 months and last injection on 05/04/2018.  She also takes calcium and vitamin D supplement.  DEXA on 01/11/2018 showed T score of -3.9 at radius with BMD of 0.457.  Negative 7.58% change in BMD from previous DEXA on 10/15/15.  Last vitamin D level 57 on 08/31/2017  Closed fracture of right wrist with routine healing, subsequent encounter-  Primary osteoarthritis of right hip-She has no discomfort at this time.  History of total hip replacement, left-She has no discomfort at this time.   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-She has limited range of motion of her cervical spine.  DDD (degenerative disc disease), lumbar-She has intermittent lower back pain.    Trochanteric bursitis of right hip-She has no discomfort at this time.   Cellulitis of right upper extremity: She was diagnosed with cellulitis of the right upper extremity this past weekend.  She was evaluated at urgent care after noticing diffuse warmth, swelling, and redness extending from her right elbow to right wrist.  She was started on Clindamycin 300 mg TID for 7 days.  She has noticed the  swelling improving but continues to have significant pain and swelling.  She is afebrile at this time.  She was advised to hold Oluminant and MTX until the infection has completely cleared.  She will call [facility] to see if one of the orthopedic surgeons can evaluate her urgently.    Follow Up Instructions: She will follow up in 2 months.  Future order for TB gold was placed.    I discussed the assessment and treatment plan with the patient. The patient was provided an opportunity to ask questions and all were answered. The patient agreed with the plan and demonstrated an understanding of the instructions.   The patient was advised to call back or seek an in-person evaluation if the symptoms worsen or if the condition fails to improve as anticipated.  I provided 25 minutes of non-face-to-face time during this encounter. Bo Merino, MD   Scribed by-  Ofilia Neas, PA-C

## 2018-09-30 ENCOUNTER — Other Ambulatory Visit: Payer: Self-pay | Admitting: Internal Medicine

## 2018-10-03 ENCOUNTER — Other Ambulatory Visit: Payer: Self-pay | Admitting: Rheumatology

## 2018-10-04 ENCOUNTER — Other Ambulatory Visit: Payer: Self-pay

## 2018-10-04 ENCOUNTER — Encounter: Payer: Self-pay | Admitting: Rheumatology

## 2018-10-04 ENCOUNTER — Encounter: Payer: Self-pay | Admitting: Orthopaedic Surgery

## 2018-10-04 ENCOUNTER — Ambulatory Visit: Payer: Medicare Other | Admitting: Orthopaedic Surgery

## 2018-10-04 ENCOUNTER — Encounter: Payer: Self-pay | Admitting: Internal Medicine

## 2018-10-04 VITALS — Ht 62.0 in | Wt 114.0 lb

## 2018-10-04 DIAGNOSIS — L03114 Cellulitis of left upper limb: Secondary | ICD-10-CM | POA: Diagnosis not present

## 2018-10-04 NOTE — Telephone Encounter (Signed)
Last Visit: 09/27/18 Next Visit: 12/09/18  Okay to refill per Dr. Estanislado Pandy

## 2018-10-04 NOTE — Progress Notes (Signed)
The patient comes in for follow-up 1 week after we had seen her for a cat scratch or bite along her left forearm.  She has significant bruising in this area.  She does have significant rheumatologic issues.  There was concern that there was some cellulitis as well.  She is on clindamycin.  She has since had almost all of her symptoms resolved except for some itching.  Examination and inspection of her left forearm and skin shows complete resolution of the cellulitis and the bruising.  She has good range of motion of her elbow wrist and hand and she has no evidence of residual infection or continued infection.  This point she will follow-up as needed.  She did let me know that she has a left hip replacement was done by someone else in town about 6 to 7 years ago and has done wonderfully until she fell this past December injuring that hip.  She feels like it is been unstable although she has not had a dislocation that has needed to be reduced in the emergency room.  She said that surgeon has ordered a CT scan of the hip.  I recommended she keep follow-up with that surgeon and then I would be happy to see her for second opinion depends on what is found.  She had posterior hip surgery for her first hip so she likely needs to continue to see that specialist who is someone that I would continue to recommend and is well thought of and well liked.

## 2018-10-06 ENCOUNTER — Telehealth: Payer: Self-pay | Admitting: Neurology

## 2018-10-06 NOTE — Telephone Encounter (Signed)
FYI 10-06-18 Pt has called and gave verbal consent to file insurance for [email or web address] VV Pt email address is [email or web address]  Pt understands that although there may be some limitations with this type of visit, we will take all precautions to reduce any security or privacy concerns.  Pt understands that this will be treated like an in office visit and we will file with pt's insurance, and there may be a patient responsible charge related to this service. *the link was emailed to pt*

## 2018-10-06 NOTE — Telephone Encounter (Signed)
I called pt. Updated medication list, pharmacy, allergies on file. She verified she received email from [place]. I walked her through steps for VV.

## 2018-10-07 ENCOUNTER — Other Ambulatory Visit: Payer: Self-pay

## 2018-10-07 ENCOUNTER — Encounter: Payer: Self-pay | Admitting: Neurology

## 2018-10-07 ENCOUNTER — Ambulatory Visit (INDEPENDENT_AMBULATORY_CARE_PROVIDER_SITE_OTHER): Payer: Medicare Other | Admitting: Neurology

## 2018-10-07 DIAGNOSIS — M0579 Rheumatoid arthritis with rheumatoid factor of multiple sites without organ or systems involvement: Secondary | ICD-10-CM | POA: Diagnosis not present

## 2018-10-07 DIAGNOSIS — F419 Anxiety disorder, unspecified: Secondary | ICD-10-CM | POA: Diagnosis not present

## 2018-10-07 DIAGNOSIS — G35 Multiple sclerosis: Secondary | ICD-10-CM | POA: Diagnosis not present

## 2018-10-07 DIAGNOSIS — R269 Unspecified abnormalities of gait and mobility: Secondary | ICD-10-CM

## 2018-10-07 MED ORDER — TRAMADOL HCL 50 MG PO TABS: ORAL_TABLET | ORAL | 5 refills | Status: DC

## 2018-10-07 MED ORDER — DIAZEPAM 5 MG PO TABS: 5.0000 mg | ORAL_TABLET | Freq: Every day | ORAL | 5 refills | Status: DC

## 2018-10-07 NOTE — Progress Notes (Signed)
[facility]  PATIENT: Sherri [name] DOB: [birth year]  REFERRING CLINICIAN: Unice Cobble  HISTORY FROM: patient REASON FOR VISIT: Multiple sclerosis   HISTORICAL  CHIEF COMPLAINT:  Chief Complaint  Patient presents with   Multiple Sclerosis    stable off any DMT   Rheumatoid Arthritis    HISTORY OF PRESENT ILLNESS:  [name] is a 78 year old woman who was diagnosed with relapsing remitting MS in 2010.     Update 10/07/18:  Virtual Visit via Video Note I connected with [place] on 10/07/18 at 11:30 AM EDT by a video enabled telemedicine application and verified that I am speaking with the correct person.  I discussed the limitations of evaluation and management by telemedicine and the availability of in person appointments. The patient expressed understanding and agreed to proceed.  History of Present Illness: Her MS is stable.   She stopped Aubagio due to hair loss and did not go back on a DMT.  Her gait has  worsened but she notes more orthopedics/RA issues.   Strenth is poor in prox right arm due to a biceps tendon tear.    She is on methotrexate and Olumiant (baricitinib) and prednisone.   These have helped her RA.   She is also on Prolia for osteoporosis.  She had a bad infection after her cat jumped on her and pierced her skin.  We discussed RA meds may alter the immune system.  She is aware and is trying to follow CDC guidance to reduce her Covid-19 risk.    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  She has had hip pain since.  She saw orthopedics and her replaced hip easily pops out of her socket (with bending over a few times).  She has been able to pop it back in but the pain can be severe.    Anxiety is higher since the hip problems worsened as she is constantly worried about it dislocating again.   She does not feel more depressed but is more worried with her health issues and CoVid-19    She has not had anymore episodes of passing out.  One episidoe occurred with Valsalva last year     Observations/Objective: 129/71   97.3   Pulse=68  She is a well-developed well-nourished woman in no acute distress.  The head is normocephalic and atraumatic.  Sclera are anicteric.  Visible skin appears normal.  The neck has a good range of motion.  Pharynx and tongue have normal appearance.  She is alert and fully oriented with fluent speech and good attention, knowledge and memory.  Extraocular muscles are intact.  Facial strength is normal.  Palatal elevation and tongue protrusion are midline.  She appears to have normal strength in the arms.  Rapid alternating movements and finger-nose-finger are performed well.   Assessment and Plan: Multiple sclerosis (HCC)  Rheumatoid arthritis involving multiple sites with positive rheumatoid factor (HCC)  Anxiety  Gait disturbance  1.  She will continue off of a disease modifying therapy for her MS.  The combination of her age and being on methotrexate will likely have some benefit in reducing the likelihood of an exacerbation. 2.   She will continue the rheumatoid arthritis medications.  She continues to see rheumatology and orthopedics for those issues.    3.   She has more stress but prefers not to add an additional medication at this time. 4.   We had  a conversation about her COVID-19 risks.  The MS itself is not a significant risk for her.  Being on some of the medications could at some risk and she should continue to follow the [facility] guidance.   5.   I will renew the tramadol and Valium.  She has been compliant and is not getting controlled substance medications from multiple doctors 6.    She will return to see me in 6 months or sooner if there are new or worsening or new neurologic symptoms  Follow Up Instructions: I discussed the assessment and treatment plan with the patient. The patient was provided an opportunity to ask questions and  all were answered. The patient agreed with the plan and demonstrated an understanding of the instructions.    The patient was advised to call back or seek an in-person evaluation if the symptoms worsen or if the condition fails to improve as anticipated.  I provided 28 minutes of non-face-to-face time during this encounter. ______________________ From previous visits Update 04/14/2018: She is being switched from [place] to [name] (Upadacitinib) and then to  Olumiant (baricitinib).  [name] was not covered by insurance.   She has been told she had bursitis in the right hip    Pain is in the buttock to the lateral thigh and proximal knee.   Due to foot pain she feels her gait is poor.   Pain is in the ball of the foot and the toes.   The MS has ben mostly stable.   She did not tolerate Aubagio due to hair loss and stopped.    She is currently off a DMT.    Her gait is worse mostly due to more pain.   However, balance seems worse.  She denies numbness.    Bladder function is unchanged.       She had one episode of syncope while defecating and when she fell, she broke bones in the right foot  Update 11/10/2017: She feels her MS has been stable.  She is off of any MS DMT but is on Xeljanz and Methotrexate for RA.    She is on prednisone 5 mg (10 mg for flares) daily.   Pain has been much worse on attempts to go off prednisone.    Her MS is mild so I'm fine with her off a DMT.     Aubagio caused a lot of hair loss.    We discussed avoiding Enbrel/Remicade/Humira/Simponi  She was found to have C7, C8 and T1 radiculopathy on recent NCV/EMG.   She has fusion from C3 to C7.   She has a cervical spine MRI later this week.    Cervical spine 3 or 4 years ago showed fusion from C3-C7.  There is possible pseudoarthrosis at C6-C7 and minimal spondylolisthesis at C7-T1.  There is  She has had some dysphagia but it is felt possibly due to an enlarged thyroid (felt due to cysts on Bx)     Update 06/09/2017: She feels  the MS is stable.   No new weakness or numbness and gait is unchanged. She remains on methotrexate and Xeljanz for her rheumatoid arthritis. She is off of her MS disease modifying therapy. She did not tolerate leflunomide due to hair loss.  She reports more hip pain that radiates into her leg all the way to the side of the foot..   A recent hip injection by Dr. Estanislado Pandy did not help.   Of note she has had L4L5 and L5S1 fusion  by Dr. Joya Salm in the past.     Her gabapentin dose is currently 100 in am and 100 mg at night.   He had trouble tolerating 200 mg in the morning and 300 mg at night.   Gabapentin did help her RLS and she tolerates it well.   It does not help her pain..      From 10/30/2016: MS:   She feels her MS is stable.    She also has RA and is on Xeljanz and prednisone.    She is not on any DMT for her MS.     She denies any recent exacerbation.     Her right leg sometimes gives out on her but the changes have been gradual.     Vision:    She had recent surgery for ptosis (10/02/16) after she had cataract surgery (still had reduced peripheral vision after initil surgery when she noted the problem more).      RA:   She remains on Xeljanz and methotrexate.   MTX dose reduced as higher does may have affected renal function and dose was reduced.     Gait/strength:    She feels gait is worse and she notes dragging her right foot more, even for short distances.    He also notes more cramps in the right leg.   The right leg also sometimes gives out randomly.  Gait also affected by right hip pain and she also had left THR in the past.      She feels gait is not bad enough to use a cane but does feel off balanced.    The legs (R>L) also ache a lot more.  Tramadol during the day and oxycodone and valium at night help the pain a lot.   Leg cramps are worse when she lays down.    Sensation:   She notes mild foot numbness bilaterally which is stable.     She has stable left facial numbness  in V2 and  V3 > V1.   This was her first symptom (tongue was numb many years before diagnosis).    No arm numbness.     Bladder:   She has urinary frequency and hesitancy.   She does not completely empty (has PVR's at urology).    She has no recent UTI's,       Fatigue/sleep:     Fatigue is mild most days but worse a day after 'overdoing it'.    She is sleeping well with Valium 5 mg and Oxycodone 10 mg at bedtime .   He sleeps better with her pain under better control.    She is active, walking daily.    Mood/cognition:     Mood is doing well.    She does not note that she has any significant cognitive changes.       MS History:   When she was in her late 62s she had an episode of right-sided numbness and she lost the use of her right arm for a couple of weeks. At that time, she was told that she might have MS. Over the next 30 or 40 years she had several more similar but milder episodes. In 2010, she had an MRI of the cervical spine (for arm pain) and it showed foci c/w multiple sclerosis. An MRI of the brain was also performed  shortly thereafter showing white matter lesions consistent with the diagnosis.  Initially, she was placed on Avonex. She also has rheumatoid arthritis  and was placed on methotrexate and then a combination of methotrexate with [name]. We decided to stop the Avonex as she probably would get some benefit from those medications for the MS as well. For the most part her MS has been stable over the past 5 years.However, early 2016,  she had numbness in the left face and weakness in her legs   REVIEW OF SYSTEMS:  Constitutional: No fevers, chills, sweats, or change in appetite Eyes: No visual changes, double vision, eye pain Ear, nose and throat: No hearing loss, ear pain, nasal congestion, sore throat Cardiovascular: No chest pain, palpitations Respiratory:  No shortness of breath at rest or with exertion.   No wheezes GastrointestinaI: No nausea, vomiting, diarrhea, abdominal pain, fecal  incontinence Genitourinary:  as above. Musculoskeletal:  No neck pain, back pain Integumentary: No rash, pruritus, skin lesions Neurological: as above Psychiatric: he has had a lot of family stress leading to some depression and more anxiety. Endocrine: No palpitations, diaphoresis, change in appetite, change in weigh or increased thirst Hematologic/Lymphatic:  No anemia, purpura, petechiae. Allergic/Immunologic: No itchy/runny eyes, nasal congestion, recent allergic reactions, rashes  ALLERGIES: Allergies  Allergen Reactions   Darvon [Propoxyphene Hcl] Shortness Of Breath    ? Dose Related ? Lowered respirations greatly   Meperidine Hcl Shortness Of Breath   Penicillins Hives and Other (See Comments)    Has patient had a PCN reaction causing immediate rash, facial/tongue/throat swelling, SOB or lightheadedness with hypotension: No SEVERE RASH INVOLVING MUCUS MEMBRANES or SKIN NECROSIS: #  #  #  YES  #  #  #  Has patient had a PCN reaction that required hospitalization No Has patient had a PCN reaction occurring within the last 10 years: No.    Azathioprine Other (See Comments)    Weakness   Leflunomide     unknown   Ezetimibe-Simvastatin Nausea And Vomiting    HOME MEDICATIONS:   PAST MEDICAL HISTORY: Past Medical History:  Diagnosis Date   Anxiety    takes Valium daily as needed   Bruises easily    d/t meds   Cancer ([place])    basal cell ca, in situ- uterine    Cataracts, bilateral    Chronic back pain    Dizziness    r/t to meds   GERD (gastroesophageal reflux disease)    no meds on a regular basis but will take Tums if needed   Headache(784.0)    r/t neck issues   History of bronchitis 6-71yrs ago   History of colon polyps    benign   Hyperlipidemia    takes Atorvastatin on Mondays and Fridays   Hypertension    takes Ramipril daily   Joint pain    Joint swelling    Multiple sclerosis ([place])    Neuromuscular disorder ([place])    Dr. Park Liter- [facility], follows [facility].   Nocturia    OA (osteoarthritis)    Dr Jani Gravel weekly, RA- hands- knees- feet    PONV (postoperative nausea and vomiting)    trouble urinating after surgery in 2014   Rheumatoid arthritis(714.0)    Dr Estanislado Pandy takes [name] daily   Right wrist fracture    Skin cancer    Urinary retention    sees Dr.Wrenn about 2 times a yr    PAST SURGICAL HISTORY: Past Surgical History:  Procedure Laterality Date   ABDOMINAL HYSTERECTOMY     1985   APPENDECTOMY     with TAH   BACK SURGERY  several   BREAST BIOPSY Right    Results were negative   BROW LIFT Bilateral 10/02/2016   Procedure: BILATERAL LOWER LID BLEPHAROPLASTY;  Surgeon: Clista Bernhardt, MD;  Location: [facility];  Service: Plastics;  Laterality: Bilateral;   CARPAL TUNNEL RELEASE Right    cataracts     CERVICAL FUSION     Dr Joya Salm   CERVICAL FUSION  12/31/2012   Dr Joya Salm   CHEST TUBE INSERTION     for traumatic Pneumothorax   COLONOSCOPY  07/16/2010   normal    COLONOSCOPY W/ POLYPECTOMY  1997   negative since; Dr Deatra Ina   ESOPHAGOGASTRODUODENOSCOPY  07/16/2010   normal   eye lid raise     EYE SURGERY Bilateral    cataracts removed - /w IOL   JOINT REPLACEMENT     LUMBAR FUSION     Dr Joya Salm   NASAL SINUS SURGERY     POSTERIOR CERVICAL FUSION/FORAMINOTOMY N/A 12/31/2012   Procedure: POSTERIOR LATERAL CERVICAL FUSION/FORAMINOTOMY LEVEL 1 CERVICAL THREE-FOUR WITH LATERAL MASS SCREWS;  Surgeon: Floyce Stakes, MD;  Location: [facility];  Service: Neurosurgery;  Laterality: N/A;   PTOSIS REPAIR Bilateral 10/02/2016   Procedure: INTERNAL PTOSIS REPAIR;  Surgeon: Clista Bernhardt, MD;  Location: [place];  Service: Plastics;  Laterality: Bilateral;   SKIN BIOPSY     THYROID SURGERY     R lobe removed- 1972, has grown back - CT last done- 2018   TOTAL HIP ARTHROPLASTY  12/22/2011   Procedure: TOTAL HIP ARTHROPLASTY;  Surgeon: Kerin Salen, MD;  Location: [place];  Service: Orthopedics;  Laterality: Left;   TOTAL SHOULDER ARTHROPLASTY     TUBAL LIGATION      FAMILY HISTORY: Family History  Problem Relation Age of Onset   Cancer Mother        pancreatic   Diabetes Mother    Heart disease Father        Rheumatic   Cancer Father        ? stomach   Cancer Sister        stomach   Cancer Maternal Aunt        X 4; ? primary   Heart disease Paternal Aunt    Cancer Maternal Grandmother        cervical   Colon cancer Other        Aunts   Multiple sclerosis Daughter    Hypertension Neg Hx    Stroke Neg Hx     SOCIAL HISTORY:  Social History   Socioeconomic History   Marital status: Married    Spouse name: Not on file   Number of children: Not on file   Years of education: Not on file   Highest education level: Not on file  Occupational History   Occupation: retired  Scientist, product/process development strain: Not on file   Food insecurity:    Worry: Not on file    Inability: Not on file   Transportation needs:    Medical: Not on file    Non-medical: Not on file  Tobacco Use   Smoking status: Former Smoker    Packs/day: 2.50    Years: 20.00    Pack years: 50.00    Types: Cigarettes    Last attempt to quit: 05/26/1981    Years since quitting: 37.3   Smokeless tobacco: Never Used   Tobacco comment: Smoked [phone], up to 2.5 ppd  Substance and Sexual Activity  Alcohol use: Yes    Alcohol/week: 14.0 standard drinks    Types: 14 Cans of beer per week    Comment: 4% beer   Drug use: Never   Sexual activity: Yes  Lifestyle   Physical activity:    Days per week: Not on file    Minutes per session: Not on file   Stress: Not on file  Relationships   Social connections:    Talks on phone: Not on file    Gets together: Not on file    Attends religious service: Not on file    Active member of club or organization: Not on file    Attends meetings of clubs or  organizations: Not on file    Relationship status: Not on file   Intimate partner violence:    Fear of current or ex partner: Not on file    Emotionally abused: Not on file    Physically abused: Not on file    Forced sexual activity: Not on file  Other Topics Concern   Not on file  Social History Narrative   Not on file     PHYSICAL EXAM  There were no vitals filed for this visit.  There is no height or weight on file to calculate BMI.   General: The patient is well-developed and well-nourished and in no acute distress  Musculoskeletal:   There is mild lower paraspinal tenderness, mild to moderate right piriformis tendernes and moderately severe right trochanteric bursa tenderness.   Mildly positive Faber sign.    Skin: Extremities are without rash or edema.  Neurologic Exam  Mental status: The patient is alert and oriented x 3 at the time of the examination. The patient has apparent normal recent and remote memory, with an apparently normal attention span and concentration ability.   Speech is normal.  CN:  EOMI, Intact facial sensation..  Facial strength is normal.  Trapezius strength is normal..  Palatal elevation is normal and Tongue protrusion midline.    No obvious hearing deficits are noted.  Motor:  Right biceps bulges (she has a history of rupture).   Tone is mildly increased in her legs, left > right. Strength appears 5 / 5 in all 4 extremities except 4/5 ulnar innervated intrinsic hand muscles on the right.     Sensory: Sensory testing is intact to touch and vibration in the arms and legs.  Coordination: Cerebellar testing reveals good rapid alternating movements in hands . Reduced heel to shin, worse on the right.     Gait and station: Station is stable.  The gait is arthritic.  It is mildly wide.. She has a mild right foot drop. The tandem gait is wide.   Negative Romberg  Reflexes: Deep tendon reflexes are normal and symmetric.    DIAGNOSTIC DATA (LABS,  IMAGING, TESTING) - I reviewed patient records, labs, notes, testing and imaging myself where available.  Lab Results  Component Value Date   WBC 5.8 08/05/2018   HGB 12.0 08/05/2018   HCT 34.4 (L) 08/05/2018   MCV 102.4 (H) 08/05/2018   PLT 362 08/05/2018      Component Value Date/Time   NA 139 08/05/2018 1025   NA 141 07/01/2016 1516   NA 136 02/22/2015   K 4.3 08/05/2018 1025   K 4.4 02/22/2015   CL 100 08/05/2018 1025   CL 98 02/22/2015   CO2 31 08/05/2018 1025   CO2 30 02/22/2015   GLUCOSE 94 08/05/2018 1025   BUN 10 08/05/2018  1025   BUN 9 07/01/2016 1516   CREATININE 0.88 08/05/2018 1025   CALCIUM 9.6 08/05/2018 1025   CALCIUM 9.6 02/22/2015   PROT 6.3 08/05/2018 1025   PROT 6.7 07/01/2016 1516   PROT 6.6 02/22/2015   ALBUMIN 4.3 10/26/2017 1527   ALBUMIN 4.5 07/01/2016 1516   ALBUMIN 4.3 02/22/2015   AST 25 08/05/2018 1025   AST 24 02/22/2015   ALT 16 08/05/2018 1025   ALT 18 02/22/2015   ALKPHOS 47 10/26/2017 1527   ALKPHOS 34 02/22/2015   BILITOT 0.4 08/05/2018 1025   BILITOT 0.3 07/01/2016 1516   GFRNONAA 64 08/05/2018 1025   GFRAA 74 08/05/2018 1025   Lab Results  Component Value Date   CHOL 188 08/05/2018   HDL 72 08/05/2018   LDLCALC 96 08/05/2018   LDLDIRECT 122.9 11/15/2012   TRIG 103 08/05/2018   CHOLHDL 2.6 08/05/2018   Lab Results  Component Value Date   HGBA1C 5.6 10/24/2013   Lab Results  Component Value Date   GYFVCBSW96 759 (H) 07/22/2016   Lab Results  Component Value Date   TSH 1.11 10/26/2017        ASSESSMENT AND PLAN  Multiple sclerosis (HCC)  Rheumatoid arthritis involving multiple sites with positive rheumatoid factor (HCC)  Anxiety  Gait disturbance    Isley Zinni A. Felecia Shelling, MD, PhD 1/63/8466, 59:93 PM Certified in Neurology, Clinical Neurophysiology, Sleep Medicine, Pain Medicine and Neuroimaging  [facility] [address], [place], [place] [address] [phone]

## 2018-10-17 ENCOUNTER — Other Ambulatory Visit: Payer: Self-pay | Admitting: Neurology

## 2018-10-22 ENCOUNTER — Encounter: Payer: Self-pay | Admitting: Rheumatology

## 2018-10-22 ENCOUNTER — Ambulatory Visit: Payer: Medicare Other | Admitting: Internal Medicine

## 2018-10-22 ENCOUNTER — Encounter: Payer: Self-pay | Admitting: Orthopaedic Surgery

## 2018-10-22 MED ORDER — PREDNISONE 5 MG PO TABS: ORAL_TABLET | ORAL | 0 refills | Status: DC

## 2018-10-29 ENCOUNTER — Other Ambulatory Visit: Payer: Self-pay | Admitting: Rheumatology

## 2018-10-29 DIAGNOSIS — M810 Age-related osteoporosis without current pathological fracture: Secondary | ICD-10-CM

## 2018-10-29 NOTE — Telephone Encounter (Signed)
Last Visit: 09/27/18 Next Visit: 12/09/18 Labs: 08/05/18 MCV is slightly elevated Updating labs. 11/01/18  Okay to refill per Dr. Estanislado Pandy

## 2018-10-29 NOTE — Telephone Encounter (Signed)
Patient's Prolia in due June 2020. Patient advised that she would need lab work.

## 2018-10-29 NOTE — Telephone Encounter (Signed)
Patient advised she is due for labs prior to her Prolia injection. Patient advised we will need to check them 10 days before. Patient will come 11/01/18.

## 2018-11-01 ENCOUNTER — Other Ambulatory Visit: Payer: Self-pay

## 2018-11-01 ENCOUNTER — Ambulatory Visit (INDEPENDENT_AMBULATORY_CARE_PROVIDER_SITE_OTHER): Payer: Medicare Other | Admitting: Orthopaedic Surgery

## 2018-11-01 ENCOUNTER — Encounter: Payer: Self-pay | Admitting: Internal Medicine

## 2018-11-01 ENCOUNTER — Encounter: Payer: Self-pay | Admitting: Orthopaedic Surgery

## 2018-11-01 ENCOUNTER — Ambulatory Visit: Payer: Self-pay

## 2018-11-01 DIAGNOSIS — Z96642 Presence of left artificial hip joint: Secondary | ICD-10-CM | POA: Diagnosis not present

## 2018-11-01 DIAGNOSIS — M25552 Pain in left hip: Secondary | ICD-10-CM

## 2018-11-01 MED FILL — PROLIA 60 MG/ML SOLN: 60 | 180 days supply | Qty: 1 | Fill #0

## 2018-11-01 NOTE — Progress Notes (Signed)
Office Visit Note   Patient: Sherri [name]           Date of Birth: [birth year]           MRN: [id number] Visit Date: 11/01/2018              Requested by: Binnie Rail, MD [place], [place] [address] PCP: Binnie Rail, MD   Assessment & Plan: Visit Diagnoses:  1. Pain in left hip   2. History of total left hip replacement     Plan: We can certainly obtain a three-phase bone scan and a CT scan to rule out any problems with the prosthesis or fractures or evidence of loosening.  I gave her reassurance that her plain films look good but I do feel this is warranted to have the studies and I believe Dr. Justine Null wanted actually ordered a CT scan of her left hip and she did not follow through with this.  I have encouraged her to follow through and we would order a new CT scan of her left hip and a three-phase bone scan so we can further assess her complaints.  All question concerns were answered and addressed.  Follow-up can be when she has the studies done.  Follow-Up Instructions: No follow-ups on file.   Orders:  Orders Placed This Encounter  Procedures  . XR HIP UNILAT W OR W/O PELVIS 1V LEFT   No orders of the defined types were placed in this encounter.     Procedures: No procedures performed   Clinical Data: No additional findings.   Subjective: Chief Complaint  Patient presents with  . Left Hip - Pain  The patient is being seen for her left hip.  She has a remote history of a left total hip arthroplasty done through a posterior approach by Dr. Mayer Camel.  She sustained a hard mechanical fall back in December and has been complaining of hip pain since then.  She had seen Dr. Justine Null and he recommended a CT scan.  She did not have that done in light of the coronavirus pandemic.  She is seeking another opinion with [place].  She reports that back in December when she fell she almost had sensations of if her left hip was coming in and out of place.  She is never had a  history of a dislocation before.  She says that she is afraid to bend over because of that.  HPI  Review of Systems She currently denies any headache, chest pain, shortness of breath, fever, chills, nausea, vomiting  Objective: Vital Signs: There were no vitals taken for this visit.  Physical Exam She is alert and orient x3 and in no acute distress.  She is not walking with a limp.  She is a very thin individual. Ortho Exam Examination of her left hip shows no pain with range of motion.  There is a little bit of pain when compressing the hip into the acetabulum but otherwise the exam appears normal and the hip does not appear unstable to me. Specialty Comments:  No specialty comments available.  Imaging: Xr Hip Unilat W Or W/o Pelvis 1v Left  Result Date: 11/01/2018 An AP pelvis and lateral left hip shows a well-seated total hip arthroplasty with no complicating features.  There is no evidence of fracture on plain films.  There is no evidence of loosening or poly-wear on plain films.    PMFS History: Patient Active Problem List   Diagnosis  Date Noted  . Right sided sciatica 04/14/2018  . Trochanteric bursitis of right hip 04/14/2018  . S/P cervical spinal fusion 11/10/2017  . Swelling of left foot 10/26/2017  . Dysphagia 10/26/2017  . Left ankle swelling 10/26/2017  . Localized swelling, mass, and lump of head 08/18/2017  . Gastroesophageal reflux disease 08/06/2017  . Easy bruising 08/06/2017  . Anxiety 04/24/2017  . Mid back pain 03/26/2017  . Left sided chest pain 03/25/2017  . Dyspnea on exertion 03/14/2017  . Hyperkalemia 03/13/2017  . Submandibular gland swelling 01/08/2017  . High risk medication use 09/02/2016  . Urinary frequency 09/02/2016  . Piriformis muscle pain 09/02/2016  . Fatigue 07/22/2016  . Hair loss 07/22/2016  . Right hip pain 04/08/2016  . Right knee pain 02/25/2016  . Dyspepsia 04/05/2015  . Numbness 11/14/2014  . Biceps tendon tear 10/10/2014   . Insomnia 10/10/2014  . Gait disturbance 06/13/2014  . 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 OP (osteoporosis) 01/18/2014  . Other long term (current) drug therapy 01/18/2014  . Other dysphagia 12/29/2013  . Hoarseness 12/29/2013  . 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 Personal history of colonic polyps 07/03/2010  . Nocturia 05/28/2010  . THYROID NODULE 05/17/2008  . Multiple sclerosis ([place]) 05/17/2008  . Chronic fatigue 03/30/2007  . Hyperlipidemia 08/20/2006  . Essential hypertension 08/20/2006  . Rheumatoid arthritis ([place]) 08/20/2006  . CERVICAL CANCER, HX OF 08/20/2006  . GASTRIC ULCER, HX OF 08/20/2006   Past Medical History:  Diagnosis Date  . Anxiety    takes Valium daily as needed  . Bruises easily    d/t meds  . Cancer ([place])    basal cell ca, in situ- uterine   . Cataracts, bilateral   . Chronic back pain   . Dizziness    r/t to meds  . GERD (gastroesophageal reflux disease)    no meds on a regular basis but will take Tums if needed  . Headache(784.0)    r/t neck issues  . History of bronchitis 6-41yrs ago  . History of colon polyps    benign  . Hyperlipidemia    takes Atorvastatin on Mondays and Fridays  . Hypertension    takes Ramipril daily  . Joint pain   . Joint swelling   . Multiple sclerosis ([place])   . Neuromuscular disorder ([place])    Dr. Park Liter- [facility], follows M.S.  . Nocturia   . OA (osteoarthritis)    Dr Jani Gravel weekly, RA- hands- knees- feet   . PONV (postoperative nausea and vomiting)    trouble urinating after surgery in 2014  . Rheumatoid arthritis(714.0)    Dr Estanislado Pandy takes [name] daily  . Right wrist fracture   . Skin cancer   . Urinary retention    sees Dr.Wrenn about 2 times a yr    Family History  Problem Relation Age of Onset  . Cancer Mother        pancreatic  . Diabetes Mother   . Heart disease Father        Rheumatic  . Cancer Father        ? stomach  . Cancer Sister         stomach  . Cancer Maternal Aunt        X 4; ? primary  . Heart disease Paternal Aunt   . Cancer Maternal Grandmother        cervical  . Colon cancer Other  Aunts  . Multiple sclerosis Daughter   . Hypertension Neg Hx   . Stroke Neg Hx     Past Surgical History:  Procedure Laterality Date  . ABDOMINAL HYSTERECTOMY     1985  . APPENDECTOMY     with TAH  . BACK SURGERY     several  . BREAST BIOPSY Right    Results were negative  . BROW LIFT Bilateral 10/02/2016   Procedure: BILATERAL LOWER LID BLEPHAROPLASTY;  Surgeon: Clista Bernhardt, MD;  Location: [facility];  Service: Plastics;  Laterality: Bilateral;  . CARPAL TUNNEL RELEASE Right   . cataracts    . CERVICAL FUSION     Dr Joya Salm  . CERVICAL FUSION  12/31/2012   Dr Joya Salm  . CHEST TUBE INSERTION     for traumatic Pneumothorax  . COLONOSCOPY  07/16/2010   normal   . COLONOSCOPY W/ POLYPECTOMY  1997   negative since; Dr Deatra Ina  . ESOPHAGOGASTRODUODENOSCOPY  07/16/2010   normal  . eye lid raise    . EYE SURGERY Bilateral    cataracts removed - /w IOL  . JOINT REPLACEMENT    . LUMBAR FUSION     Dr Joya Salm  . NASAL SINUS SURGERY    . POSTERIOR CERVICAL FUSION/FORAMINOTOMY N/A 12/31/2012   Procedure: POSTERIOR LATERAL CERVICAL FUSION/FORAMINOTOMY LEVEL 1 CERVICAL THREE-FOUR WITH LATERAL MASS SCREWS;  Surgeon: Floyce Stakes, MD;  Location: [facility];  Service: Neurosurgery;  Laterality: N/A;  . PTOSIS REPAIR Bilateral 10/02/2016   Procedure: INTERNAL PTOSIS REPAIR;  Surgeon: Clista Bernhardt, MD;  Location: [facility];  Service: Plastics;  Laterality: Bilateral;  . SKIN BIOPSY    . THYROID SURGERY     R lobe removed- 1972, has grown back - CT last done- 2018  . TOTAL HIP ARTHROPLASTY  12/22/2011   Procedure: TOTAL HIP ARTHROPLASTY;  Surgeon: Kerin Salen, MD;  Location: [facility];  Service: Orthopedics;  Laterality: Left;  . TOTAL SHOULDER ARTHROPLASTY    . TUBAL LIGATION     Social History   Occupational  History  . Occupation: retired  Tobacco Use  . Smoking status: Former Smoker    Packs/day: 2.50    Years: 20.00    Pack years: 50.00    Types: Cigarettes    Last attempt to quit: 05/26/1981    Years since quitting: 37.4  . Smokeless tobacco: Never Used  . Tobacco comment: Smoked [phone], up to 2.5 ppd  Substance and Sexual Activity  . Alcohol use: Yes    Alcohol/week: 14.0 standard drinks    Types: 14 Cans of beer per week    Comment: 4% beer  . Drug use: Never  . Sexual activity: Yes

## 2018-11-02 NOTE — Telephone Encounter (Signed)
Received Prolia injection from [facility].  Placed in [place].  Patient is still due for labs.  Will schedule appointment for administration once she obtains labs.

## 2018-11-03 ENCOUNTER — Other Ambulatory Visit: Payer: Self-pay | Admitting: Rheumatology

## 2018-11-03 ENCOUNTER — Other Ambulatory Visit: Payer: Self-pay

## 2018-11-03 DIAGNOSIS — Z79899 Other long term (current) drug therapy: Secondary | ICD-10-CM | POA: Diagnosis not present

## 2018-11-03 NOTE — Telephone Encounter (Signed)
Last Visit: 09/27/18 Next Visit: 12/09/18 Labs:08/05/18 MCV is slightly elevated  Patient will update labs today 11/03/18.   Okay to refill per Dr. Estanislado Pandy

## 2018-11-04 ENCOUNTER — Encounter: Payer: Self-pay | Admitting: Internal Medicine

## 2018-11-04 MED FILL — OLUMIANT 2 MG TABS: 2 | 30 days supply | Qty: 30 | Fill #1

## 2018-11-05 ENCOUNTER — Telehealth: Payer: Self-pay | Admitting: Rheumatology

## 2018-11-05 LAB — COMPLETE METABOLIC PANEL WITH GFR
AG Ratio: 1.8 (calc) (ref 1.0–2.5)
ALT: 15 U/L (ref 6–29)
AST: 21 U/L (ref 10–35)
Albumin: 4.2 g/dL (ref 3.6–5.1)
Alkaline phosphatase (APISO): 45 U/L (ref 37–153)
BUN/Creatinine Ratio: 12 (calc) (ref 6–22)
BUN: 12 mg/dL (ref 7–25)
CO2: 29 mmol/L (ref 20–32)
Calcium: 9.3 mg/dL (ref 8.6–10.4)
Chloride: 102 mmol/L (ref 98–110)
Creat: 0.98 mg/dL — ABNORMAL HIGH (ref 0.60–0.93)
GFR, Est African American: 65 mL/min/{1.73_m2} (ref >=60)
GFR, Est Non African American: 56 mL/min/{1.73_m2} — ABNORMAL LOW (ref >=60)
Globulin: 2.3 g/dL (calc) (ref 1.9–3.7)
Glucose, Bld: 99 mg/dL (ref 65–99)
Potassium: 4.9 mmol/L (ref 3.5–5.3)
Sodium: 140 mmol/L (ref 135–146)
Total Bilirubin: 0.5 mg/dL (ref 0.2–1.2)
Total Protein: 6.5 g/dL (ref 6.1–8.1)

## 2018-11-05 LAB — CBC WITH DIFFERENTIAL/PLATELET
Absolute Monocytes: 380 cells/uL (ref 200–950)
Basophils Absolute: 10 cells/uL (ref 0–200)
Basophils Relative: 0.2 %
Eosinophils Absolute: 10 cells/uL — ABNORMAL LOW (ref 15–500)
Eosinophils Relative: 0.2 %
HCT: 36.6 % (ref 35.0–45.0)
Hemoglobin: 12.6 g/dL (ref 11.7–15.5)
Lymphs Abs: 670 cells/uL — ABNORMAL LOW (ref 850–3900)
MCH: 34.4 pg — ABNORMAL HIGH (ref 27.0–33.0)
MCHC: 34.4 g/dL (ref 32.0–36.0)
MCV: 100 fL (ref 80.0–100.0)
MPV: 9.6 fL (ref 7.5–12.5)
Monocytes Relative: 7.6 %
Neutro Abs: 3930 cells/uL (ref 1500–7800)
Neutrophils Relative %: 78.6 %
Platelets: 314 10*3/uL (ref 140–400)
RBC: 3.66 10*6/uL — ABNORMAL LOW (ref 3.80–5.10)
RDW: 13.9 % (ref 11.0–15.0)
Total Lymphocyte: 13.4 %
WBC: 5 10*3/uL (ref 3.8–10.8)

## 2018-11-05 LAB — QUANTIFERON-TB GOLD PLUS
Mitogen-NIL: 6.17 IU/mL
NIL: 0.01 IU/mL
QuantiFERON-TB Gold Plus: NEGATIVE
TB1-NIL: 0 IU/mL
TB2-NIL: 0 IU/mL

## 2018-11-05 NOTE — Telephone Encounter (Signed)
Patient left a message 11/04/2018 requesting for someone to call her back to let her know what blood type she is. Please call to advise.

## 2018-11-05 NOTE — Telephone Encounter (Signed)
I called patient 

## 2018-11-05 NOTE — Progress Notes (Signed)
TB gold negative

## 2018-11-08 ENCOUNTER — Other Ambulatory Visit: Payer: Self-pay

## 2018-11-08 ENCOUNTER — Ambulatory Visit
Admission: RE | Admit: 2018-11-08 | Discharge: 2018-11-08 | Disposition: A | Discharge status: Home / Self Care | Payer: Medicare Other | Source: Ambulatory Visit | Attending: Surgery | Admitting: Surgery

## 2018-11-08 DIAGNOSIS — Z96642 Presence of left artificial hip joint: Secondary | ICD-10-CM

## 2018-11-08 DIAGNOSIS — M25552 Pain in left hip: Secondary | ICD-10-CM | POA: Diagnosis not present

## 2018-11-08 NOTE — Telephone Encounter (Signed)
Labs drawn on 6/10 and stable.  Scheduled for Prolia injection on 6/19 at 10am

## 2018-11-12 ENCOUNTER — Ambulatory Visit (INDEPENDENT_AMBULATORY_CARE_PROVIDER_SITE_OTHER): Payer: Medicare Other | Admitting: *Deleted

## 2018-11-12 ENCOUNTER — Other Ambulatory Visit: Payer: Self-pay

## 2018-11-12 VITALS — BP 122/68 | HR 62

## 2018-11-12 DIAGNOSIS — M810 Age-related osteoporosis without current pathological fracture: Secondary | ICD-10-CM

## 2018-11-12 MED ORDER — DENOSUMAB 60 MG/ML ~~LOC~~ SOSY
60.0000 mg | PREFILLED_SYRINGE | Freq: Once | SUBCUTANEOUS | Status: AC
  Administered 2018-11-12: 60 mg via SUBCUTANEOUS

## 2018-11-12 NOTE — Patient Instructions (Signed)
Standing Labs We placed an order today for your standing lab work.    Please come back and get your standing labs in 10 days  We have open lab daily Monday through Thursday from 8:30-12:30 PM and 1:30-4:30 PM and Friday from 8:30-12:30 PM and 1:30 -4:00 PM at the office of Dr. Shaili Deveshwar.   You may experience shorter wait times on Monday and Friday afternoons. The office is located at [address], [address], [place], [place] [address] No appointment is necessary.   Labs are drawn by [facility].  You may receive a bill from [facility] for your lab work.  If you wish to have your labs drawn at another location, please call the office 24 hours in advance to send orders.  If you have any questions regarding directions or hours of operation,  please call [phone].   Just as a reminder please drink plenty of water prior to coming for your lab work. Thanks!  

## 2018-11-12 NOTE — Progress Notes (Signed)
Patient in the office for a Prolia injection. Patient given injection in left arm and tolerated injection well. Patient instructed to return in 10 days for labs. Patient verbalized understanding.   Administrations This Visit    denosumab (PROLIA) injection 60 mg    Admin Date 11/12/2018 Action Given Dose 60 mg Route Subcutaneous Administered By Carole Binning, LPN

## 2018-11-17 ENCOUNTER — Ambulatory Visit (INDEPENDENT_AMBULATORY_CARE_PROVIDER_SITE_OTHER): Payer: Medicare Other | Admitting: Orthopaedic Surgery

## 2018-11-17 ENCOUNTER — Encounter: Payer: Self-pay | Admitting: Orthopaedic Surgery

## 2018-11-17 ENCOUNTER — Other Ambulatory Visit: Payer: Self-pay

## 2018-11-17 DIAGNOSIS — Z96642 Presence of left artificial hip joint: Secondary | ICD-10-CM

## 2018-11-17 DIAGNOSIS — M25552 Pain in left hip: Secondary | ICD-10-CM | POA: Diagnosis not present

## 2018-11-17 NOTE — Progress Notes (Signed)
The patient comes in today for continued evaluation treatment of her left hip replacement.  This was done by 1 of my colleagues in town 7 years ago through a posterior approach.  She has had about 6 episodes where she felt that hip was coming out of place although it is never truly dislocated.  She is a very thin individual.  She has had some mechanical falls.  An MRI of her right hip that was done by my colleague in December did not show any injury to her left hip.  She did show me her bone density study and she does have osteoporosis which she is being treated for.  She is had another fall recently.  On exam her right hip is normal the left replaced hip is normal in terms of full range of motion with no instability at all.  We did obtain a CT scan of that left hip and there was evidence of sclerotic changes in the left pubis and left sacrum suggesting insufficiency fractures and these were not present on the MRI exam.  The hip replacement self on the left side appears bone ingrown with no complicating features and no injuries around that left hip.  I gave her reassurance that her hip replacement is fine.  I am not sure what symptoms she is feeling as if the hip is coming out of place because it never truly has.  Her exam was pretty normal with that hip.  At this point I do feel that she would benefit from outpatient physical therapy with a guided therapy to work on core strengthening as well as hip strengthening and bilateral lower extremity strengthening.  I want him to work on her balance and coordination so she is not a fall risk.  All question concerns were answered and addressed.  We will see her back in about 6 weeks to see how she is doing overall.

## 2018-11-22 ENCOUNTER — Other Ambulatory Visit: Payer: Self-pay | Admitting: *Deleted

## 2018-11-22 DIAGNOSIS — Z79899 Other long term (current) drug therapy: Secondary | ICD-10-CM

## 2018-11-23 LAB — COMPLETE METABOLIC PANEL WITH GFR
AG Ratio: 2 (calc) (ref 1.0–2.5)
ALT: 13 U/L (ref 6–29)
AST: 25 U/L (ref 10–35)
Albumin: 4.3 g/dL (ref 3.6–5.1)
Alkaline phosphatase (APISO): 41 U/L (ref 37–153)
BUN: 15 mg/dL (ref 7–25)
CO2: 28 mmol/L (ref 20–32)
Calcium: 9.4 mg/dL (ref 8.6–10.4)
Chloride: 100 mmol/L (ref 98–110)
Creat: 0.89 mg/dL (ref 0.60–0.93)
GFR, Est African American: 73 mL/min/{1.73_m2} (ref >=60)
GFR, Est Non African American: 63 mL/min/{1.73_m2} (ref >=60)
Globulin: 2.2 g/dL (calc) (ref 1.9–3.7)
Glucose, Bld: 87 mg/dL (ref 65–99)
Potassium: 4.8 mmol/L (ref 3.5–5.3)
Sodium: 138 mmol/L (ref 135–146)
Total Bilirubin: 0.5 mg/dL (ref 0.2–1.2)
Total Protein: 6.5 g/dL (ref 6.1–8.1)

## 2018-11-24 ENCOUNTER — Other Ambulatory Visit: Payer: Self-pay

## 2018-11-24 ENCOUNTER — Ambulatory Visit (INDEPENDENT_AMBULATORY_CARE_PROVIDER_SITE_OTHER): Payer: Medicare Other | Admitting: Physical Therapy

## 2018-11-24 ENCOUNTER — Encounter: Payer: Self-pay | Admitting: Physical Therapy

## 2018-11-24 DIAGNOSIS — R2689 Other abnormalities of gait and mobility: Secondary | ICD-10-CM

## 2018-11-24 DIAGNOSIS — M25552 Pain in left hip: Secondary | ICD-10-CM

## 2018-11-25 ENCOUNTER — Encounter: Payer: Self-pay | Admitting: Physical Therapy

## 2018-11-25 NOTE — Progress Notes (Deleted)
Office Visit Note  Patient: Sherri [name]             Date of Birth: [birth year]           MRN: [id number]             PCP: Binnie Rail, MD Referring: Binnie Rail, MD Visit Date: 12/09/2018 Occupation: @GUAROCC @  Subjective:  No chief complaint on file.   History of Present Illness: Sherri [name] is a 78 y.o. female ***   Activities of Daily Living:  Patient reports morning stiffness for *** {minute/hour:19697}.   Patient {ACTIONS;DENIES/REPORTS:21021675::"Denies"} nocturnal pain.  Difficulty dressing/grooming: {ACTIONS;DENIES/REPORTS:21021675::"Denies"} Difficulty climbing stairs: {ACTIONS;DENIES/REPORTS:21021675::"Denies"} Difficulty getting out of chair: {ACTIONS;DENIES/REPORTS:21021675::"Denies"} Difficulty using hands for taps, buttons, cutlery, and/or writing: {ACTIONS;DENIES/REPORTS:21021675::"Denies"}  No Rheumatology ROS completed.   PMFS History:  Patient Active Problem List   Diagnosis Date Noted  . Right sided sciatica 04/14/2018  . Trochanteric bursitis of right hip 04/14/2018  . S/P cervical spinal fusion 11/10/2017  . Swelling of left foot 10/26/2017  . Dysphagia 10/26/2017  . Left ankle swelling 10/26/2017  . Localized swelling, mass, and lump of head 08/18/2017  . Gastroesophageal reflux disease 08/06/2017  . Easy bruising 08/06/2017  . Anxiety 04/24/2017  . Mid back pain 03/26/2017  . Left sided chest pain 03/25/2017  . Dyspnea on exertion 03/14/2017  . Hyperkalemia 03/13/2017  . Submandibular gland swelling 01/08/2017  . High risk medication use 09/02/2016  . Urinary frequency 09/02/2016  . Piriformis muscle pain 09/02/2016  . Fatigue 07/22/2016  . Hair loss 07/22/2016  . Right hip pain 04/08/2016  . Right knee pain 02/25/2016  . Dyspepsia 04/05/2015  . Numbness 11/14/2014  . Biceps tendon tear 10/10/2014  . Insomnia 10/10/2014  . Gait disturbance 06/13/2014  . 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 OP (osteoporosis) 01/18/2014   . Other long term (current) drug therapy 01/18/2014  . Other dysphagia 12/29/2013  . Hoarseness 12/29/2013  . 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 Personal history of colonic polyps 07/03/2010  . Nocturia 05/28/2010  . THYROID NODULE 05/17/2008  . Multiple sclerosis ([place]) 05/17/2008  . Chronic fatigue 03/30/2007  . Hyperlipidemia 08/20/2006  . Essential hypertension 08/20/2006  . Rheumatoid arthritis ([place]) 08/20/2006  . CERVICAL CANCER, HX OF 08/20/2006  . GASTRIC ULCER, HX OF 08/20/2006    Past Medical History:  Diagnosis Date  . Anxiety    takes Valium daily as needed  . Bruises easily    d/t meds  . Cancer ([place])    basal cell ca, in situ- uterine   . Cataracts, bilateral   . Chronic back pain   . Dizziness    r/t to meds  . GERD (gastroesophageal reflux disease)    no meds on a regular basis but will take Tums if needed  . Headache(784.0)    r/t neck issues  . History of bronchitis 6-6yrs ago  . History of colon polyps    benign  . Hyperlipidemia    takes Atorvastatin on Mondays and Fridays  . Hypertension    takes Ramipril daily  . Joint pain   . Joint swelling   . Multiple sclerosis ([place])   . Neuromuscular disorder ([place])    Dr. Park Liter- [facility], follows M.S.  . Nocturia   . OA (osteoarthritis)    Dr Jani Gravel weekly, RA- hands- knees- feet   . PONV (postoperative nausea and vomiting)    trouble urinating after surgery in 2014  . Rheumatoid  arthritis(714.0)    Dr Estanislado Pandy takes [name] daily  . Right wrist fracture   . Skin cancer   . Urinary retention    sees Dr.Wrenn about 2 times a yr    Family History  Problem Relation Age of Onset  . Cancer Mother        pancreatic  . Diabetes Mother   . Heart disease Father        Rheumatic  . Cancer Father        ? stomach  . Cancer Sister        stomach  . Cancer Maternal Aunt        X 4; ? primary  . Heart disease Paternal Aunt   . Cancer Maternal Grandmother         cervical  . Colon cancer Other        Aunts  . Multiple sclerosis Daughter   . Hypertension Neg Hx   . Stroke Neg Hx    Past Surgical History:  Procedure Laterality Date  . ABDOMINAL HYSTERECTOMY     1985  . APPENDECTOMY     with TAH  . BACK SURGERY     several  . BREAST BIOPSY Right    Results were negative  . BROW LIFT Bilateral 10/02/2016   Procedure: BILATERAL LOWER LID BLEPHAROPLASTY;  Surgeon: Clista Bernhardt, MD;  Location: [facility];  Service: Plastics;  Laterality: Bilateral;  . CARPAL TUNNEL RELEASE Right   . cataracts    . CERVICAL FUSION     Dr Joya Salm  . CERVICAL FUSION  12/31/2012   Dr Joya Salm  . CHEST TUBE INSERTION     for traumatic Pneumothorax  . COLONOSCOPY  07/16/2010   normal   . COLONOSCOPY W/ POLYPECTOMY  1997   negative since; Dr Deatra Ina  . ESOPHAGOGASTRODUODENOSCOPY  07/16/2010   normal  . eye lid raise    . EYE SURGERY Bilateral    cataracts removed - /w IOL  . JOINT REPLACEMENT    . LUMBAR FUSION     Dr Joya Salm  . NASAL SINUS SURGERY    . POSTERIOR CERVICAL FUSION/FORAMINOTOMY N/A 12/31/2012   Procedure: POSTERIOR LATERAL CERVICAL FUSION/FORAMINOTOMY LEVEL 1 CERVICAL THREE-FOUR WITH LATERAL MASS SCREWS;  Surgeon: Floyce Stakes, MD;  Location: [facility];  Service: Neurosurgery;  Laterality: N/A;  . PTOSIS REPAIR Bilateral 10/02/2016   Procedure: INTERNAL PTOSIS REPAIR;  Surgeon: Clista Bernhardt, MD;  Location: [facility];  Service: Plastics;  Laterality: Bilateral;  . SKIN BIOPSY    . THYROID SURGERY     R lobe removed- 1972, has grown back - CT last done- 2018  . TOTAL HIP ARTHROPLASTY  12/22/2011   Procedure: TOTAL HIP ARTHROPLASTY;  Surgeon: Kerin Salen, MD;  Location: [facility];  Service: Orthopedics;  Laterality: Left;  . TOTAL SHOULDER ARTHROPLASTY    . TUBAL LIGATION     Social History   Social History Narrative  . Not on file   Immunization History  Administered Date(s) Administered  . Influenza Whole 02/22/2007, 02/08/2013  .  Influenza, High Dose Seasonal PF 02/04/2015, 02/18/2017, 02/21/2018  . Influenza-Unspecified 01/24/2014, 01/25/2016, 02/18/2017  . Pneumococcal Conjugate-13 11/23/2012, 09/08/2014  . Pneumococcal Polysaccharide-23 05/26/2006  . Pneumococcal-Unspecified 12/28/2012  . Td 05/28/2010  . Zoster 05/26/2009     Objective: Vital Signs: There were no vitals taken for this visit.   Physical Exam   Musculoskeletal Exam: ***  CDAI Exam: CDAI Score: - Patient Global: -; Provider Global: -  Swollen: -; Tender: - Joint Exam   No joint exam has been documented for this visit   There is currently no information documented on the homunculus. Go to the Rheumatology activity and complete the homunculus joint exam.  Investigation: No additional findings.  Imaging: Ct Hip Left Wo Contrast  Result Date: 11/08/2018 CLINICAL DATA:  Left hip pain since falling approximately 6 months ago. Initial limited ambulation, recently improving. History left hip arthroplasty. EXAM: CT OF THE LEFT HIP WITHOUT CONTRAST TECHNIQUE: Multidetector CT imaging of the left hip was performed according to the standard protocol. Multiplanar CT image reconstructions were also generated. COMPARISON:  Radiographs 11/01/2018.  Right hip MRI 05/04/2018. FINDINGS: Bones/Joint/Cartilage Status post left total hip arthroplasty. The hardware is well positioned without evidence of loosening. There is no evidence of dislocation. There is sclerosis throughout the visualized left hemi sacrum, most likely due to a healed or healing fracture. There is also sclerosis within the left superior pubic ramus. No acute cortical fracture or lucent lesion identified. There is no significant hip joint effusion. Postsurgical changes in the lower lumbar spine are partially imaged. Ligaments Suboptimally assessed by CT. Muscles and Tendons Unremarkable. Soft tissues Mild iliofemoral atherosclerosis.  No focal fluid collection. IMPRESSION: 1. Sclerosis involving  the left sacrum and left superior pubic ramus, likely due to healing/healed fractures. These findings appear new compared with previous right hip MRI. 2. No evidence of dislocation or other complication from the previous left total hip arthroplasty. 3. No significant soft tissue findings. Electronically Signed   By: Richardean Sale M.D.   On: 11/08/2018 16:22   Xr Hip Unilat W Or W/o Pelvis 1v Left  Result Date: 11/01/2018 An AP pelvis and lateral left hip shows a well-seated total hip arthroplasty with no complicating features.  There is no evidence of fracture on plain films.  There is no evidence of loosening or poly-wear on plain films.   Recent Labs: Lab Results  Component Value Date   WBC 5.0 11/03/2018   HGB 12.6 11/03/2018   PLT 314 11/03/2018   NA 138 11/22/2018   K 4.8 11/22/2018   CL 100 11/22/2018   CO2 28 11/22/2018   GLUCOSE 87 11/22/2018   BUN 15 11/22/2018   CREATININE 0.89 11/22/2018   BILITOT 0.5 11/22/2018   ALKPHOS 47 10/26/2017   AST 25 11/22/2018   ALT 13 11/22/2018   PROT 6.5 11/22/2018   ALBUMIN 4.3 10/26/2017   CALCIUM 9.4 11/22/2018   GFRAA 73 11/22/2018   QFTBGOLDPLUS NEGATIVE 11/03/2018    Speciality Comments: Prior therapy includes: [name] (cost), [name] (cost)  Procedures:  No procedures performed Allergies: Darvon [propoxyphene hcl], Meperidine hcl, Penicillins, Azathioprine, Leflunomide, and Ezetimibe-simvastatin   Assessment / Plan:     Visit Diagnoses: No diagnosis found.    Other medical problems are listed as follows:  History of multiple sclerosis  Primary insomnia  Other fatigue  History of hyperlipidemia  History of anxiety  History of depression  History of hypertension   Orders: No orders of the defined types were placed in this encounter.  No orders of the defined types were placed in this encounter.   Face-to-face time spent with patient was *** minutes. Greater than 50% of time was spent in counseling  and coordination of care.  Follow-Up Instructions: No follow-ups on file.   Earnestine Mealing, CMA  Note - This record has been created using Editor, commissioning.  Chart creation errors have been sought, but may not always  have been  located. Such creation errors do not reflect on  the standard of medical care.

## 2018-11-25 NOTE — Therapy (Signed)
[facility] [address] [place], [place], [address] Phone: [phone]   Fax:  [phone]  Physical Therapy Evaluation  Patient Details  Name: Sherri [name] MRN: [id number] Date of Birth: 04-28-1941 Referring Provider (PT): Dr. Ninfa Linden   Encounter Date: 11/24/2018  PT End of Session - 11/24/18 1304    Visit Number  1    Number of Visits  12    Date for PT Re-Evaluation  01/05/19    Authorization Type  UHC    PT Start Time  1205    PT Stop Time  1250    PT Time Calculation (min)  45 min    Activity Tolerance  Patient tolerated treatment well    Behavior During Therapy  [facility] for tasks assessed/performed       Past Medical History:  Diagnosis Date  . Anxiety    takes Valium daily as needed  . Bruises easily    d/t meds  . Cancer ([place])    basal cell ca, in situ- uterine   . Cataracts, bilateral   . Chronic back pain   . Dizziness    r/t to meds  . GERD (gastroesophageal reflux disease)    no meds on a regular basis but will take Tums if needed  . Headache(784.0)    r/t neck issues  . History of bronchitis 6-48yrs ago  . History of colon polyps    benign  . Hyperlipidemia    takes Atorvastatin on Mondays and Fridays  . Hypertension    takes Ramipril daily  . Joint pain   . Joint swelling   . Multiple sclerosis ([place])   . Neuromuscular disorder ([place])    Dr. Park Liter- [facility], follows M.S.  . Nocturia   . OA (osteoarthritis)    Dr Jani Gravel weekly, RA- hands- knees- feet   . PONV (postoperative nausea and vomiting)    trouble urinating after surgery in 2014  . Rheumatoid arthritis(714.0)    Dr Estanislado Pandy takes [name] daily  . Right wrist fracture   . Skin cancer   . Urinary retention    sees Dr.Wrenn about 2 times a yr    Past Surgical History:  Procedure Laterality Date  . ABDOMINAL HYSTERECTOMY     1985  . APPENDECTOMY     with TAH  . BACK SURGERY     several  . BREAST BIOPSY  Right    Results were negative  . BROW LIFT Bilateral 10/02/2016   Procedure: BILATERAL LOWER LID BLEPHAROPLASTY;  Surgeon: Clista Bernhardt, MD;  Location: [facility];  Service: Plastics;  Laterality: Bilateral;  . CARPAL TUNNEL RELEASE Right   . cataracts    . CERVICAL FUSION     Dr Joya Salm  . CERVICAL FUSION  12/31/2012   Dr Joya Salm  . CHEST TUBE INSERTION     for traumatic Pneumothorax  . COLONOSCOPY  07/16/2010   normal   . COLONOSCOPY W/ POLYPECTOMY  1997   negative since; Dr Deatra Ina  . ESOPHAGOGASTRODUODENOSCOPY  07/16/2010   normal  . eye lid raise    . EYE SURGERY Bilateral    cataracts removed - /w IOL  . JOINT REPLACEMENT    . LUMBAR FUSION     Dr Joya Salm  . NASAL SINUS SURGERY    . POSTERIOR CERVICAL FUSION/FORAMINOTOMY N/A 12/31/2012   Procedure: POSTERIOR LATERAL CERVICAL FUSION/FORAMINOTOMY LEVEL 1 CERVICAL THREE-FOUR WITH LATERAL MASS SCREWS;  Surgeon: Floyce Stakes, MD;  Location: [facility];  Service: Neurosurgery;  Laterality: N/A;  . PTOSIS REPAIR Bilateral 10/02/2016   Procedure: INTERNAL PTOSIS REPAIR;  Surgeon: Clista Bernhardt, MD;  Location: [facility];  Service: Plastics;  Laterality: Bilateral;  . SKIN BIOPSY    . THYROID SURGERY     R lobe removed- 1972, has grown back - CT last done- 2018  . TOTAL HIP ARTHROPLASTY  12/22/2011   Procedure: TOTAL HIP ARTHROPLASTY;  Surgeon: Kerin Salen, MD;  Location: [facility];  Service: Orthopedics;  Laterality: Left;  . TOTAL SHOULDER ARTHROPLASTY    . TUBAL LIGATION      There were no vitals filed for this visit.   Subjective Assessment - 11/24/18 1204    Subjective  Pt had L TKA by Dr.Rowan in 2013. Pt fell Dec 1st, 2019. 3 falls in one day, due to leg giving away. She had R wrist fx and possible L pelvic fx from that. . She also states incident of hip "popping" about 1 week ago. She states she is able to roll around on floor and get it to "go back" . Has stairs at home, but does not have to go up. Pt reluctant now for  bending, getting up, reaching low, from recent incidents. She has had recent imaging negative for pathology with implant. .    Pertinent History  MS (well controlled, no meds) ,  rheumatoid arthritis,  lumbar and cervical fusion, previous ankle fx, skin CA, labral tear in R hip.    Limitations  Lifting;Standing;Walking;House hold activities    Diagnostic tests  x-ray, CT for L hip, recent dexa-scan ( + osteoporosis)    Patient Stated Goals  increased strength, decreased pain    Currently in Pain?  Yes    Pain Score  3     Pain Location  Hip    Pain Orientation  Left    Pain Descriptors / Indicators  Aching    Pain Type  Chronic pain    Pain Onset  More than a month ago    Pain Frequency  Intermittent    Aggravating Factors   bending, squatting, standing, walking. Significant increase in pain when leg "pops"         [facility] PT Assessment - 11/25/18 0001      Assessment   Medical Diagnosis  L hip pain    Referring Provider (PT)  Dr. Ninfa Linden    Prior Therapy  not for current injury      Precautions   Precautions  Posterior Hip      Balance Screen   Has the patient fallen in the past 6 months  Yes    How many times?  4    Has the patient had a decrease in activity level because of a fear of falling?   Yes    Is the patient reluctant to leave their home because of a fear of falling?   Yes      Prior Function   Level of Independence  Independent      Cognition   Overall Cognitive Status  Within Functional Limits for tasks assessed      Posture/Postural Control   Posture Comments  unremarkable, Pelvic crests and leg length appear even,       ROM / Strength   AROM / PROM / Strength  AROM;Strength      AROM   Overall AROM Comments  L hip AROM: WFL, pt hesitant for full Flexion and IR      Strength  Overall Strength Comments  L hip: 4+/5 all motions,        Palpation   Palpation comment  Soreness at L lateral sacral border, tenderness in L glute/laterally        Ambulation/Gait   Gait Comments  Mild decrease in hip extension L>R, stiff back posture, likley from fusions.                 Objective measurements completed on examination: See above findings.              PT Education - 11/24/18 1303    Education Details  PT POC, exam findings, HEP, transfer and squatting mechanics/precautions.    Person(s) Educated  Patient    Methods  Explanation;Demonstration    Comprehension  Verbalized understanding       PT Short Term Goals - 11/24/18 1305      PT SHORT TERM GOAL #1   Title  Pt to be independent with initial HEP    Time  2    Period  Weeks    Status  New    Target Date  12/08/18        PT Long Term Goals - 11/25/18 1201      PT LONG TERM GOAL #1   Title  Pt to be independent for long term HEP of strengthening for hips.    Time  6    Period  Weeks    Status  New    Target Date  01/05/19      PT LONG TERM GOAL #2   Title  Pt with report decreased pain to 0-2/10 with transfers, standing, and walking activity, to improve ability for IADLs.    Time  6    Period  Weeks    Status  New    Target Date  01/05/19      PT LONG TERM GOAL #3   Title  Pt to demo increased strength of L hip to br 5/5, to improve stability, gait, and pain.    Time  6    Period  Weeks    Status  New    Target Date  01/05/19      PT LONG TERM GOAL #4   Title  Pt to demo proper lifting/squat mechanics, to decrease risk for posterior hip instability.    Time  6    Period  Weeks    Status  New    Target Date  01/05/19             Plan - 11/25/18 1208    Clinical Impression Statement  Pt presents with primary complaint of increased pain in L hip, since fall in december. She also states instances of hip"popping out" which causes significant pain. She has negative imaging for hardware dysfunction. Pt with overall good preservation of L hip ROM and strength for her age, but she does have hesitation for full hip flexion and  rotation, some hypomobility for extension. Pt with mild weakness in hip and glute musculature, and will benefit from strength and stabilization to minimize further incidents from occuing where leg gives way on her. Pt with decreased endurance for standing and ambulation, with mild gait deficits. Pt with decreased ability for full functional activities, and increased fear of pain with bending, squatting, reaching/lifting at low heights. Pt to benefit from skilled PT to improve defiicts and safety with functional movement.    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 complexity    Clinical Decision Making  Moderate    Rehab Potential  Good    PT Frequency  2x / week    PT Duration  6 weeks    PT Treatment/Interventions  ADLs/Self Care Home Management;Cryotherapy;Electrical Stimulation;Ultrasound;Traction;Moist Heat;Iontophoresis 4mg /ml Dexamethasone;Gait training;Stair training;Functional mobility training;Therapeutic activities;Therapeutic exercise;Orthotic Fit/Training;Patient/family education;Neuromuscular re-education;Manual techniques;Passive range of motion;Dry needling;Spinal Manipulations;Taping;Joint Manipulations    Consulted and Agree with Plan of Care  Patient       Patient will benefit from skilled therapeutic intervention in order to improve the following deficits and impairments:  Abnormal gait, Difficulty walking, Increased muscle spasms, Decreased endurance, Decreased activity tolerance, Pain, Decreased balance, Impaired flexibility, Improper body mechanics, Decreased strength, Decreased mobility  Visit  Diagnosis: 1. Pain in left hip   2. Other abnormalities of gait and mobility        Problem List Patient Active Problem List   Diagnosis Date Noted  . Right sided sciatica 04/14/2018  . Trochanteric bursitis of right hip 04/14/2018  . S/P cervical spinal fusion 11/10/2017  . Swelling of left foot 10/26/2017  . Dysphagia 10/26/2017  . Left ankle swelling 10/26/2017  . Localized swelling, mass, and lump of head 08/18/2017  . Gastroesophageal reflux disease 08/06/2017  . Easy bruising 08/06/2017  . Anxiety 04/24/2017  . Mid back pain 03/26/2017  . Left sided chest pain 03/25/2017  . Dyspnea on exertion 03/14/2017  . Hyperkalemia 03/13/2017  . Submandibular gland swelling 01/08/2017  . High risk medication use 09/02/2016  . Urinary frequency 09/02/2016  . Piriformis muscle pain 09/02/2016  . Fatigue 07/22/2016  . Hair loss 07/22/2016  . Right hip pain 04/08/2016  . Right knee pain 02/25/2016  . Dyspepsia 04/05/2015  . Numbness 11/14/2014  . Biceps tendon tear 10/10/2014  . Insomnia 10/10/2014  . Gait disturbance 06/13/2014  . 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 OP (osteoporosis) 01/18/2014  . Other long term (current) drug therapy 01/18/2014  . Other dysphagia 12/29/2013  . Hoarseness 12/29/2013  . 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 Personal history of colonic polyps 07/03/2010  . Nocturia 05/28/2010  . THYROID NODULE 05/17/2008  . Multiple sclerosis ([place]) 05/17/2008  . Chronic fatigue 03/30/2007  . Hyperlipidemia 08/20/2006  . Essential hypertension 08/20/2006  . Rheumatoid arthritis ([place]) 08/20/2006  . CERVICAL CANCER, HX OF 08/20/2006  . GASTRIC ULCER, HX OF 08/20/2006    Lyndee Hensen, PT, DPT 12:16 PM  11/25/18    [facility] [place], [place], [address] Phone: [phone]   Fax:  [phone]  Name: Sherri [name] MRN: [id number] Date of Birth: 03-02-1941

## 2018-11-27 ENCOUNTER — Other Ambulatory Visit: Payer: Self-pay | Admitting: Rheumatology

## 2018-11-29 ENCOUNTER — Ambulatory Visit (INDEPENDENT_AMBULATORY_CARE_PROVIDER_SITE_OTHER): Payer: Medicare Other | Admitting: Physical Therapy

## 2018-11-29 ENCOUNTER — Encounter: Payer: Self-pay | Admitting: Physical Therapy

## 2018-11-29 DIAGNOSIS — R2689 Other abnormalities of gait and mobility: Secondary | ICD-10-CM | POA: Diagnosis not present

## 2018-11-29 DIAGNOSIS — M25552 Pain in left hip: Secondary | ICD-10-CM

## 2018-11-29 NOTE — Therapy (Signed)
[facility] [address] [place], [place], [address] Phone: [phone]   Fax:  [phone]  Physical Therapy Treatment  Patient Details  Name: Sherri [name] MRN: [id number] Date of Birth: [birth year] Referring Provider (PT): Dr. Ninfa Linden   Encounter Date: 11/29/2018  PT End of Session - 11/29/18 1210    Visit Number  2    Number of Visits  12    Date for PT Re-Evaluation  01/05/19    Authorization Type  UHC    PT Start Time  1205    PT Stop Time  1252    PT Time Calculation (min)  47 min    Activity Tolerance  Patient tolerated treatment well    Behavior During Therapy  [facility] for tasks assessed/performed       Past Medical History:  Diagnosis Date  . Anxiety    takes Valium daily as needed  . Bruises easily    d/t meds  . Cancer ([place])    basal cell ca, in situ- uterine   . Cataracts, bilateral   . Chronic back pain   . Dizziness    r/t to meds  . GERD (gastroesophageal reflux disease)    no meds on a regular basis but will take Tums if needed  . Headache(784.0)    r/t neck issues  . History of bronchitis 6-71yrs ago  . History of colon polyps    benign  . Hyperlipidemia    takes Atorvastatin on Mondays and Fridays  . Hypertension    takes Ramipril daily  . Joint pain   . Joint swelling   . Multiple sclerosis ([place])   . Neuromuscular disorder ([place])    Dr. Park Liter- [facility], follows M.S.  . Nocturia   . OA (osteoarthritis)    Dr Jani Gravel weekly, RA- hands- knees- feet   . PONV (postoperative nausea and vomiting)    trouble urinating after surgery in 2014  . Rheumatoid arthritis(714.0)    Dr Estanislado Pandy takes [name] daily  . Right wrist fracture   . Skin cancer   . Urinary retention    sees Dr.Wrenn about 2 times a yr    Past Surgical History:  Procedure Laterality Date  . ABDOMINAL HYSTERECTOMY     1985  . APPENDECTOMY     with TAH  . BACK SURGERY     several  . BREAST BIOPSY  Right    Results were negative  . BROW LIFT Bilateral 10/02/2016   Procedure: BILATERAL LOWER LID BLEPHAROPLASTY;  Surgeon: Clista Bernhardt, MD;  Location: [facility];  Service: Plastics;  Laterality: Bilateral;  . CARPAL TUNNEL RELEASE Right   . cataracts    . CERVICAL FUSION     Dr Joya Salm  . CERVICAL FUSION  12/31/2012   Dr Joya Salm  . CHEST TUBE INSERTION     for traumatic Pneumothorax  . COLONOSCOPY  07/16/2010   normal   . COLONOSCOPY W/ POLYPECTOMY  1997   negative since; Dr Deatra Ina  . ESOPHAGOGASTRODUODENOSCOPY  07/16/2010   normal  . eye lid raise    . EYE SURGERY Bilateral    cataracts removed - /w IOL  . JOINT REPLACEMENT    . LUMBAR FUSION     Dr Joya Salm  . NASAL SINUS SURGERY    . POSTERIOR CERVICAL FUSION/FORAMINOTOMY N/A 12/31/2012   Procedure: POSTERIOR LATERAL CERVICAL FUSION/FORAMINOTOMY LEVEL 1 CERVICAL THREE-FOUR WITH LATERAL MASS SCREWS;  Surgeon: Floyce Stakes, MD;  Location: [facility];  Service: Neurosurgery;  Laterality: N/A;  . PTOSIS REPAIR Bilateral 10/02/2016   Procedure: INTERNAL PTOSIS REPAIR;  Surgeon: Clista Bernhardt, MD;  Location: [facility];  Service: Plastics;  Laterality: Bilateral;  . SKIN BIOPSY    . THYROID SURGERY     R lobe removed- 1972, has grown back - CT last done- 2018  . TOTAL HIP ARTHROPLASTY  12/22/2011   Procedure: TOTAL HIP ARTHROPLASTY;  Surgeon: Kerin Salen, MD;  Location: [facility];  Service: Orthopedics;  Laterality: Left;  . TOTAL SHOULDER ARTHROPLASTY    . TUBAL LIGATION      There were no vitals filed for this visit.  Subjective Assessment - 11/29/18 1208    Subjective  Pt states "today is not one of my better days". She states she feels like her MS bothers her some days (today), states her head feels "off". Denies actually being dizzy or light headed.    Pertinent History  MS (well controlled, no meds) ,  rheumatoid arthritis,  lumbar and cervical fusion, previous ankle fx, skin CA, labral tear in R hip.    Currently in Pain?   Yes    Pain Score  4     Pain Location  Hip    Pain Orientation  Left    Pain Descriptors / Indicators  Aching    Pain Type  Chronic pain    Pain Onset  More than a month ago    Pain Frequency  Intermittent                       [facility] Adult PT Treatment/Exercise - 11/29/18 1211      Posture/Postural Control   Posture Comments  --      Exercises   Exercises  Knee/Hip      Knee/Hip Exercises: Aerobic   Stationary Bike  L1 x 4 min      Knee/Hip Exercises: Standing   Hip Abduction  Both;10 reps;2 sets    Other Standing Knee Exercises  L/R and staggered stance/diagonal weight shifts x20 each;       Knee/Hip Exercises: Seated   Other Seated Knee/Hip Exercises  Hip IR/ER with RTB x20 each;       Knee/Hip Exercises: Supine   Hip Adduction Isometric  10 reps;2 sets    Bridges  20 reps    Other Supine Knee/Hip Exercises  Clam RTB x20;  Supine March x20;        Knee/Hip Exercises: Sidelying   Clams  2x10                PT Short Term Goals - 11/24/18 1305      PT SHORT TERM GOAL #1   Title  Pt to be independent with initial HEP    Time  2    Period  Weeks    Status  New    Target Date  12/08/18        PT Long Term Goals - 11/25/18 1201      PT LONG TERM GOAL #1   Title  Pt to be independent for long term HEP of strengthening for hips.    Time  6    Period  Weeks    Status  New    Target Date  01/05/19      PT LONG TERM GOAL #2   Title  Pt with report decreased pain to 0-2/10 with transfers, standing, and walking activity, to improve ability for IADLs.  Time  6    Period  Weeks    Status  New    Target Date  01/05/19      PT LONG TERM GOAL #3   Title  Pt to demo increased strength of L hip to br 5/5, to improve stability, gait, and pain.    Time  6    Period  Weeks    Status  New    Target Date  01/05/19      PT LONG TERM GOAL #4   Title  Pt to demo proper lifting/squat mechanics, to decrease risk for posterior hip instability.     Time  6    Period  Weeks    Status  New    Target Date  01/05/19            Plan - 11/29/18 1427    Clinical Impression Statement  Pt able to perform all ther ex today with no increased pain. Ther ex performed for strength and stabilization of L Hip, plan to progress as tolerated. Pt with noted leg length difference, L shorter, she states has been her baseline.    Personal Factors and Comorbidities  Comorbidity 1;Comorbidity 2;Other    Comorbidities  L TKA, osteoporosis, multiple previous fx, MS, RA, R hip labral tear    Examination-Activity Limitations  Locomotion Level;Transfers;Bed Mobility;Sit;Squat;Stairs;Stand;Lift    Examination-Participation Restrictions  Meal Prep;Cleaning;Community Activity;Shop;Laundry;Yard Work    [employer] officer  Evolving/Moderate complexity    Rehab Potential  Good    PT Frequency  2x / week    PT Duration  6 weeks    PT Treatment/Interventions  ADLs/Self Care Home Management;Cryotherapy;Electrical Stimulation;Ultrasound;Traction;Moist Heat;Iontophoresis 4mg /ml Dexamethasone;Gait training;Stair training;Functional mobility training;Therapeutic activities;Therapeutic exercise;Orthotic Fit/Training;Patient/family education;Neuromuscular re-education;Manual techniques;Passive range of motion;Dry needling;Spinal Manipulations;Taping;Joint Manipulations    Consulted and Agree with Plan of Care  Patient       Patient will benefit from skilled therapeutic intervention in order to improve the following deficits and impairments:  Abnormal gait, Difficulty walking, Increased muscle spasms, Decreased endurance, Decreased activity tolerance, Pain, Decreased balance, Impaired flexibility, Improper body mechanics, Decreased strength, Decreased mobility  Visit Diagnosis: 1. Pain in left hip   2. Other abnormalities of gait and mobility        Problem List Patient Active Problem List   Diagnosis Date Noted  . Right sided sciatica 04/14/2018   . Trochanteric bursitis of right hip 04/14/2018  . S/P cervical spinal fusion 11/10/2017  . Swelling of left foot 10/26/2017  . Dysphagia 10/26/2017  . Left ankle swelling 10/26/2017  . Localized swelling, mass, and lump of head 08/18/2017  . Gastroesophageal reflux disease 08/06/2017  . Easy bruising 08/06/2017  . Anxiety 04/24/2017  . Mid back pain 03/26/2017  . Left sided chest pain 03/25/2017  . Dyspnea on exertion 03/14/2017  . Hyperkalemia 03/13/2017  . Submandibular gland swelling 01/08/2017  . High risk medication use 09/02/2016  . Urinary frequency 09/02/2016  . Piriformis muscle pain 09/02/2016  . Fatigue 07/22/2016  . Hair loss 07/22/2016  . Right hip pain 04/08/2016  . Right knee pain 02/25/2016  . Dyspepsia 04/05/2015  . Numbness 11/14/2014  . Biceps tendon tear 10/10/2014  . Insomnia 10/10/2014  . Gait disturbance 06/13/2014  . 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 OP (osteoporosis) 01/18/2014  . Other long term (current) drug therapy 01/18/2014  . Other dysphagia 12/29/2013  . Hoarseness 12/29/2013  . 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 Personal history of colonic polyps 07/03/2010  .  Nocturia 05/28/2010  . THYROID NODULE 05/17/2008  . Multiple sclerosis ([place]) 05/17/2008  . Chronic fatigue 03/30/2007  . Hyperlipidemia 08/20/2006  . Essential hypertension 08/20/2006  . Rheumatoid arthritis ([place]) 08/20/2006  . CERVICAL CANCER, HX OF 08/20/2006  . GASTRIC ULCER, HX OF 08/20/2006    Lyndee Hensen, PT, DPT 2:32 PM  11/29/18    [facility] [place], [place], [address] Phone: [phone]   Fax:  [phone]  Name: LEIANN SPORER MRN: [id number] Date of Birth: 06-06-1940

## 2018-11-29 NOTE — Telephone Encounter (Signed)
Last Visit: 09/27/18 Next Visit: 12/09/18 Labs: 11/03/18 stable   Okay to refill per Dr. Estanislado Pandy

## 2018-11-29 NOTE — Patient Instructions (Signed)
Access Code: [email or web address]  URL: [email or web address]  Date: 11/29/2018  Prepared by: Lyndee Hensen   Exercises Supine March - 10 reps - 2 sets - 1x daily Supine Bridge - 10 reps - 2 sets - 1x daily Clamshell - 10 reps - 2 sets - 1x daily Supine Hip Adduction Isometric with Ball - 10 reps - 2 sets - 1x daily Hooklying Clamshell with Resistance - 10 reps - 2 sets - 1x daily Standing Hip Abduction - 10 reps - 2 sets - 1x daily

## 2018-12-01 ENCOUNTER — Ambulatory Visit (INDEPENDENT_AMBULATORY_CARE_PROVIDER_SITE_OTHER): Payer: Medicare Other | Admitting: Physical Therapy

## 2018-12-01 ENCOUNTER — Other Ambulatory Visit: Payer: Self-pay | Admitting: Otolaryngology

## 2018-12-01 ENCOUNTER — Telehealth: Payer: Self-pay | Admitting: Orthopaedic Surgery

## 2018-12-01 ENCOUNTER — Other Ambulatory Visit (HOSPITAL_COMMUNITY): Payer: Self-pay | Admitting: Otolaryngology

## 2018-12-01 ENCOUNTER — Encounter: Payer: Self-pay | Admitting: Physical Therapy

## 2018-12-01 DIAGNOSIS — M25552 Pain in left hip: Secondary | ICD-10-CM | POA: Diagnosis not present

## 2018-12-01 DIAGNOSIS — R2689 Other abnormalities of gait and mobility: Secondary | ICD-10-CM

## 2018-12-01 NOTE — Telephone Encounter (Signed)
Patient called advised the dentist need a note faxed stating patient do not need an antibiotic. [facility] in [place]  [address]  [phone] phone number  Patient said she do not know the fax#.  The number to contact patient is [phone]

## 2018-12-01 NOTE — Telephone Encounter (Signed)
Patient aware that Dr. Ninfa Linden doesn't give antibiotics after 3 months

## 2018-12-01 NOTE — Telephone Encounter (Signed)
LMOM for patient letting her know she probably needs to call Dr. Mayer Camel since he was the one that did the surgery, not Ninfa Linden

## 2018-12-01 NOTE — Telephone Encounter (Signed)
Patient called advised she is having her teeth cleaned tomorrow and asked if she can get a Rx for Clindamycin 300 mg? Patient said the Rx should read take 2 capsules 1 hour prior to procedure and 1 capsule an hour after procedure. The pharmacy is [facility] on [address]. The number to contact patient is [phone]

## 2018-12-01 NOTE — Therapy (Signed)
[facility] [address] [place], [place], [address] Phone: [phone]   Fax:  [phone]  Physical Therapy Treatment  Patient Details  Name: Sherri [name] MRN: [id number] Date of Birth: 12/13/1940 Referring Provider (PT): Dr. Ninfa Linden   Encounter Date: 12/01/2018  PT End of Session - 12/01/18 2229    Visit Number  3    Number of Visits  12    Date for PT Re-Evaluation  01/05/19    Authorization Type  U[employer]    PT Start Time  5456    PT Stop Time  1448    PT Time Calculation (min)  43 min    Activity Tolerance  Patient tolerated treatment well    Behavior During Therapy  [facility] for tasks assessed/performed       Past Medical History:  Diagnosis Date  . Anxiety    takes Valium daily as needed  . Bruises easily    d/t meds  . Cancer ([place])    basal cell ca, in situ- uterine   . Cataracts, bilateral   . Chronic back pain   . Dizziness    r/t to meds  . GERD (gastroesophageal reflux disease)    no meds on a regular basis but will take Tums if needed  . Headache(784.0)    r/t neck issues  . History of bronchitis 6-71yrs ago  . History of colon polyps    benign  . Hyperlipidemia    takes Atorvastatin on Mondays and Fridays  . Hypertension    takes Ramipril daily  . Joint pain   . Joint swelling   . Multiple sclerosis ([place])   . Neuromuscular disorder ([place])    Dr. Park Liter- [facility], follows M.S.  . Nocturia   . OA (osteoarthritis)    Dr Jani Gravel weekly, RA- hands- knees- feet   . PONV (postoperative nausea and vomiting)    trouble urinating after surgery in 2014  . Rheumatoid arthritis(714.0)    Dr Estanislado Pandy takes [name] daily  . Right wrist fracture   . Skin cancer   . Urinary retention    sees Dr.Wrenn about 2 times a yr    Past Surgical History:  Procedure Laterality Date  . ABDOMINAL HYSTERECTOMY     1985  . APPENDECTOMY     with TAH  . BACK SURGERY     several  . BREAST BIOPSY  Right    Results were negative  . BROW LIFT Bilateral 10/02/2016   Procedure: BILATERAL LOWER LID BLEPHAROPLASTY;  Surgeon: Clista Bernhardt, MD;  Location: [facility];  Service: Plastics;  Laterality: Bilateral;  . CARPAL TUNNEL RELEASE Right   . cataracts    . CERVICAL FUSION     Dr Joya Salm  . CERVICAL FUSION  12/31/2012   Dr Joya Salm  . CHEST TUBE INSERTION     for traumatic Pneumothorax  . COLONOSCOPY  07/16/2010   normal   . COLONOSCOPY W/ POLYPECTOMY  1997   negative since; Dr Deatra Ina  . ESOPHAGOGASTRODUODENOSCOPY  07/16/2010   normal  . eye lid raise    . EYE SURGERY Bilateral    cataracts removed - /w IOL  . JOINT REPLACEMENT    . LUMBAR FUSION     Dr Joya Salm  . NASAL SINUS SURGERY    . POSTERIOR CERVICAL FUSION/FORAMINOTOMY N/A 12/31/2012   Procedure: POSTERIOR LATERAL CERVICAL FUSION/FORAMINOTOMY LEVEL 1 CERVICAL THREE-FOUR WITH LATERAL MASS SCREWS;  Surgeon: Floyce Stakes, MD;  Location: [facility];  Service: Neurosurgery;  Laterality: N/A;  . PTOSIS REPAIR Bilateral 10/02/2016   Procedure: INTERNAL PTOSIS REPAIR;  Surgeon: Clista Bernhardt, MD;  Location: [facility];  Service: Plastics;  Laterality: Bilateral;  . SKIN BIOPSY    . THYROID SURGERY     R lobe removed- 1972, has grown back - CT last done- 2018  . TOTAL HIP ARTHROPLASTY  12/22/2011   Procedure: TOTAL HIP ARTHROPLASTY;  Surgeon: Kerin Salen, MD;  Location: [facility];  Service: Orthopedics;  Laterality: Left;  . TOTAL SHOULDER ARTHROPLASTY    . TUBAL LIGATION      There were no vitals filed for this visit.  Subjective Assessment - 12/01/18 2228    Subjective  no new complaints today    Currently in Pain?  Yes    Pain Score  4     Pain Location  Hip    Pain Orientation  Left    Pain Descriptors / Indicators  Aching    Pain Type  Chronic pain    Pain Onset  More than a month ago    Pain Frequency  Intermittent                       [facility] Adult PT Treatment/Exercise - 12/01/18 1411       Ambulation/Gait   Gait Comments  side stepping and backwards walking, 54ft x4 each;       Exercises   Exercises  Knee/Hip      Knee/Hip Exercises: Aerobic   Stationary Bike  L1 x 8 min      Knee/Hip Exercises: Standing   Hip Abduction  Both;10 reps;2 sets    Hip Extension  10 reps;2 sets;Both    Functional Squat  10 reps;2 sets    Other Standing Knee Exercises  --      Knee/Hip Exercises: Seated   Other Seated Knee/Hip Exercises  --      Knee/Hip Exercises: Supine   Hip Adduction Isometric  --    Bridges  --    Other Supine Knee/Hip Exercises  Clam RTB x20 alternating;  Supine March x20;        Knee/Hip Exercises: Sidelying   Clams  2x10                PT Short Term Goals - 11/24/18 1305      PT SHORT TERM GOAL #1   Title  Pt to be independent with initial HEP    Time  2    Period  Weeks    Status  New    Target Date  12/08/18        PT Long Term Goals - 11/25/18 1201      PT LONG TERM GOAL #1   Title  Pt to be independent for long term HEP of strengthening for hips.    Time  6    Period  Weeks    Status  New    Target Date  01/05/19      PT LONG TERM GOAL #2   Title  Pt with report decreased pain to 0-2/10 with transfers, standing, and walking activity, to improve ability for IADLs.    Time  6    Period  Weeks    Status  New    Target Date  01/05/19      PT LONG TERM GOAL #3   Title  Pt to demo increased strength of L hip to br 5/5, to improve stability, gait,  and pain.    Time  6    Period  Weeks    Status  New    Target Date  01/05/19      PT LONG TERM GOAL #4   Title  Pt to demo proper lifting/squat mechanics, to decrease risk for posterior hip instability.    Time  6    Period  Weeks    Status  New    Target Date  01/05/19            Plan - 12/01/18 2232    Clinical Impression Statement  ther ex progressed today for strength and stabilization. She has noted atrophy in R glute compared to L, and has increased difficulty with  glute contraction. Reviewed proper squat technique today. Plan to progress strength as tolerated.    Personal Factors and Comorbidities  Comorbidity 1;Comorbidity 2;Other    Comorbidities  L TKA, osteoporosis, multiple previous fx, MS, RA, R hip labral tear    Examination-Activity Limitations  Locomotion Level;Transfers;Bed Mobility;Sit;Squat;Stairs;Stand;Lift    Examination-Participation Restrictions  Meal Prep;Cleaning;Community Activity;Shop;Laundry;Yard Work    [employer]  Evolving/Moderate complexity    Rehab Potential  Good    PT Frequency  2x / week    PT Duration  6 weeks    PT Treatment/Interventions  ADLs/Self Care Home Management;Cryotherapy;Electrical Stimulation;Ultrasound;Traction;Moist Heat;Iontophoresis 4mg /ml Dexamethasone;Gait training;Stair training;Functional mobility training;Therapeutic activities;Therapeutic exercise;Orthotic Fit/Training;Patient/family education;Neuromuscular re-education;Manual techniques;Passive range of motion;Dry needling;Spinal Manipulations;Taping;Joint Manipulations    Consulted and Agree with Plan of Care  Patient       Patient will benefit from skilled therapeutic intervention in order to improve the following deficits and impairments:  Abnormal gait, Difficulty walking, Increased muscle spasms, Decreased endurance, Decreased activity tolerance, Pain, Decreased balance, Impaired flexibility, Improper body mechanics, Decreased strength, Decreased mobility  Visit Diagnosis: 1. Pain in left hip   2. Other abnormalities of gait and mobility        Problem List Patient Active Problem List   Diagnosis Date Noted  . Right sided sciatica 04/14/2018  . Trochanteric bursitis of right hip 04/14/2018  . S/P cervical spinal fusion 11/10/2017  . Swelling of left foot 10/26/2017  . Dysphagia 10/26/2017  . Left ankle swelling 10/26/2017  . Localized swelling, mass, and lump of head 08/18/2017  . Gastroesophageal reflux  disease 08/06/2017  . Easy bruising 08/06/2017  . Anxiety 04/24/2017  . Mid back pain 03/26/2017  . Left sided chest pain 03/25/2017  . Dyspnea on exertion 03/14/2017  . Hyperkalemia 03/13/2017  . Submandibular gland swelling 01/08/2017  . High risk medication use 09/02/2016  . Urinary frequency 09/02/2016  . Piriformis muscle pain 09/02/2016  . Fatigue 07/22/2016  . Hair loss 07/22/2016  . Right hip pain 04/08/2016  . Right knee pain 02/25/2016  . Dyspepsia 04/05/2015  . Numbness 11/14/2014  . Biceps tendon tear 10/10/2014  . Insomnia 10/10/2014  . Gait disturbance 06/13/2014  . 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 OP (osteoporosis) 01/18/2014  . Other long term (current) drug therapy 01/18/2014  . Other dysphagia 12/29/2013  . Hoarseness 12/29/2013  . 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 Personal history of colonic polyps 07/03/2010  . Nocturia 05/28/2010  . THYROID NODULE 05/17/2008  . Multiple sclerosis ([place]) 05/17/2008  . Chronic fatigue 03/30/2007  . Hyperlipidemia 08/20/2006  . Essential hypertension 08/20/2006  . Rheumatoid arthritis ([place]) 08/20/2006  . CERVICAL CANCER, HX OF 08/20/2006  . GASTRIC ULCER, HX OF 08/20/2006    Lyndee Hensen, PT, DPT 10:34  PM  12/01/18   [facility] [place], [place], [address] Phone: [phone]   Fax:  [phone]  Name: HATTYE SIEGFRIED MRN: [id number] Date of Birth: January 21, 1941

## 2018-12-02 ENCOUNTER — Encounter: Payer: Medicare Other | Admitting: Physical Therapy

## 2018-12-02 MED FILL — OLUMIANT 2 MG TABS: 2 | 30 days supply | Qty: 30 | Fill #2

## 2018-12-06 ENCOUNTER — Encounter: Payer: Medicare Other | Admitting: Physical Therapy

## 2018-12-07 ENCOUNTER — Other Ambulatory Visit: Payer: Self-pay | Admitting: Otolaryngology

## 2018-12-07 DIAGNOSIS — E041 Nontoxic single thyroid nodule: Secondary | ICD-10-CM

## 2018-12-08 ENCOUNTER — Encounter: Payer: Medicare Other | Admitting: Physical Therapy

## 2018-12-08 DIAGNOSIS — R3914 Feeling of incomplete bladder emptying: Secondary | ICD-10-CM | POA: Diagnosis not present

## 2018-12-08 DIAGNOSIS — Z87440 Personal history of urinary (tract) infections: Secondary | ICD-10-CM | POA: Diagnosis not present

## 2018-12-09 ENCOUNTER — Ambulatory Visit (INDEPENDENT_AMBULATORY_CARE_PROVIDER_SITE_OTHER): Payer: Medicare Other | Admitting: Physical Therapy

## 2018-12-09 ENCOUNTER — Ambulatory Visit: Payer: Medicare Other | Admitting: Physician Assistant

## 2018-12-09 ENCOUNTER — Encounter: Payer: Self-pay | Admitting: Physical Therapy

## 2018-12-09 ENCOUNTER — Encounter: Payer: Medicare Other | Admitting: Physical Therapy

## 2018-12-09 DIAGNOSIS — R2689 Other abnormalities of gait and mobility: Secondary | ICD-10-CM | POA: Diagnosis not present

## 2018-12-09 DIAGNOSIS — M25552 Pain in left hip: Secondary | ICD-10-CM

## 2018-12-09 NOTE — Therapy (Signed)
[facility] [address] [place], [place], [address] Phone: [phone]   Fax:  [phone]  Physical Therapy Treatment  Patient Details  Name: Sherri [name] MRN: [id number] Date of Birth: 1940/11/08 Referring Provider (PT): Dr. Ninfa Linden   Encounter Date: 12/09/2018  PT End of Session - 12/09/18 1451    Visit Number  4    Number of Visits  12    Date for PT Re-Evaluation  01/05/19    Authorization Type  UHC    PT Start Time  2122    PT Stop Time  1440    PT Time Calculation (min)  45 min    Activity Tolerance  Patient tolerated treatment well    Behavior During Therapy  [facility] for tasks assessed/performed       Past Medical History:  Diagnosis Date  . Anxiety    takes Valium daily as needed  . Bruises easily    d/t meds  . Cancer ([place])    basal cell ca, in situ- uterine   . Cataracts, bilateral   . Chronic back pain   . Dizziness    r/t to meds  . GERD (gastroesophageal reflux disease)    no meds on a regular basis but will take Tums if needed  . Headache(784.0)    r/t neck issues  . History of bronchitis 6-67yrs ago  . History of colon polyps    benign  . Hyperlipidemia    takes Atorvastatin on Mondays and Fridays  . Hypertension    takes Ramipril daily  . Joint pain   . Joint swelling   . Multiple sclerosis ([place])   . Neuromuscular disorder ([place])    Dr. Park Liter- [facility], follows M.S.  . Nocturia   . OA (osteoarthritis)    Dr Jani Gravel weekly, RA- hands- knees- feet   . PONV (postoperative nausea and vomiting)    trouble urinating after surgery in 2014  . Rheumatoid arthritis(714.0)    Dr Estanislado Pandy takes [name] daily  . Right wrist fracture   . Skin cancer   . Urinary retention    sees Dr.Wrenn about 2 times a yr    Past Surgical History:  Procedure Laterality Date  . ABDOMINAL HYSTERECTOMY     1985  . APPENDECTOMY     with TAH  . BACK SURGERY     several  . BREAST BIOPSY  Right    Results were negative  . BROW LIFT Bilateral 10/02/2016   Procedure: BILATERAL LOWER LID BLEPHAROPLASTY;  Surgeon: Clista Bernhardt, MD;  Location: [facility];  Service: Plastics;  Laterality: Bilateral;  . CARPAL TUNNEL RELEASE Right   . cataracts    . CERVICAL FUSION     Dr Joya Salm  . CERVICAL FUSION  12/31/2012   Dr Joya Salm  . CHEST TUBE INSERTION     for traumatic Pneumothorax  . COLONOSCOPY  07/16/2010   normal   . COLONOSCOPY W/ POLYPECTOMY  1997   negative since; Dr Deatra Ina  . ESOPHAGOGASTRODUODENOSCOPY  07/16/2010   normal  . eye lid raise    . EYE SURGERY Bilateral    cataracts removed - /w IOL  . JOINT REPLACEMENT    . LUMBAR FUSION     Dr Joya Salm  . NASAL SINUS SURGERY    . POSTERIOR CERVICAL FUSION/FORAMINOTOMY N/A 12/31/2012   Procedure: POSTERIOR LATERAL CERVICAL FUSION/FORAMINOTOMY LEVEL 1 CERVICAL THREE-FOUR WITH LATERAL MASS SCREWS;  Surgeon: Floyce Stakes, MD;  Location: [facility];  Service: Neurosurgery;  Laterality: N/A;  . PTOSIS REPAIR Bilateral 10/02/2016   Procedure: INTERNAL PTOSIS REPAIR;  Surgeon: Clista Bernhardt, MD;  Location: [facility];  Service: Plastics;  Laterality: Bilateral;  . SKIN BIOPSY    . THYROID SURGERY     R lobe removed- 1972, has grown back - CT last done- 2018  . TOTAL HIP ARTHROPLASTY  12/22/2011   Procedure: TOTAL HIP ARTHROPLASTY;  Surgeon: Kerin Salen, MD;  Location: [facility];  Service: Orthopedics;  Laterality: Left;  . TOTAL SHOULDER ARTHROPLASTY    . TUBAL LIGATION      There were no vitals filed for this visit.  Subjective Assessment - 12/09/18 1450    Subjective  Pt states her legs are very "ache" today, from her MS. She was considering cancelling appt.    Currently in Pain?  Yes    Pain Score  4     Pain Location  Hip    Pain Orientation  Left    Pain Descriptors / Indicators  Aching    Pain Type  Chronic pain    Pain Onset  More than a month ago    Pain Frequency  Intermittent                        [facility] Adult PT Treatment/Exercise - 12/09/18 1417      Ambulation/Gait   Gait Comments   backwards walking, 49ft x4 ;       Exercises   Exercises  Knee/Hip      Knee/Hip Exercises: Aerobic   Stationary Bike  L1 x 8 min      Knee/Hip Exercises: Standing   Heel Raises  20 reps    Hip Abduction  Both;10 reps;2 sets    Hip Extension  10 reps;2 sets;Both    Functional Squat  10 reps;2 sets      Knee/Hip Exercises: Seated   Long Arc Quad  20 reps;Both      Knee/Hip Exercises: Supine   Hip Adduction Isometric  10 reps;2 sets    Bridges  20 reps    Other Supine Knee/Hip Exercises  Clam RTB x20 alternating;  Supine March x20;        Knee/Hip Exercises: Sidelying   Clams  --               PT Short Term Goals - 11/24/18 1305      PT SHORT TERM GOAL #1   Title  Pt to be independent with initial HEP    Time  2    Period  Weeks    Status  New    Target Date  12/08/18        PT Long Term Goals - 11/25/18 1201      PT LONG TERM GOAL #1   Title  Pt to be independent for long term HEP of strengthening for hips.    Time  6    Period  Weeks    Status  New    Target Date  01/05/19      PT LONG TERM GOAL #2   Title  Pt with report decreased pain to 0-2/10 with transfers, standing, and walking activity, to improve ability for IADLs.    Time  6    Period  Weeks    Status  New    Target Date  01/05/19      PT LONG TERM GOAL #3   Title  Pt to demo increased  strength of L hip to br 5/5, to improve stability, gait, and pain.    Time  6    Period  Weeks    Status  New    Target Date  01/05/19      PT LONG TERM GOAL #4   Title  Pt to demo proper lifting/squat mechanics, to decrease risk for posterior hip instability.    Time  6    Period  Weeks    Status  New    Target Date  01/05/19            Plan - 12/09/18 1452    Clinical Impression Statement  Despite being achey today, pt with good tolerance for ther ex. Discussed  importance of HEP, pt states she is having a hard time doing it bc she is tired from all the cleaning/disinfecting she is doing with covid. Discussed doing earlier in the day for better tolerance. Pt with decreased stability with ambulation today, plan to progress as tolerated.    Personal Factors and Comorbidities  Comorbidity 1;Comorbidity 2;Other    Comorbidities  L TKA, osteoporosis, multiple previous fx, MS, RA, R hip labral tear    Examination-Activity Limitations  Locomotion Level;Transfers;Bed Mobility;Sit;Squat;Stairs;Stand;Lift    Examination-Participation Restrictions  Meal Prep;Cleaning;Community Activity;Shop;Laundry;Yard Work    [employer] officer  Evolving/Moderate complexity    Rehab Potential  Good    PT Frequency  2x / week    PT Duration  6 weeks    PT Treatment/Interventions  ADLs/Self Care Home Management;Cryotherapy;Electrical Stimulation;Ultrasound;Traction;Moist Heat;Iontophoresis 4mg /ml Dexamethasone;Gait training;Stair training;Functional mobility training;Therapeutic activities;Therapeutic exercise;Orthotic Fit/Training;Patient/family education;Neuromuscular re-education;Manual techniques;Passive range of motion;Dry needling;Spinal Manipulations;Taping;Joint Manipulations    Consulted and Agree with Plan of Care  Patient       Patient will benefit from skilled therapeutic intervention in order to improve the following deficits and impairments:  Abnormal gait, Difficulty walking, Increased muscle spasms, Decreased endurance, Decreased activity tolerance, Pain, Decreased balance, Impaired flexibility, Improper body mechanics, Decreased strength, Decreased mobility  Visit Diagnosis: 1. Pain in left hip   2. Other abnormalities of gait and mobility        Problem List Patient Active Problem List   Diagnosis Date Noted  . Right sided sciatica 04/14/2018  . Trochanteric bursitis of right hip 04/14/2018  . S/P cervical spinal fusion 11/10/2017  .  Swelling of left foot 10/26/2017  . Dysphagia 10/26/2017  . Left ankle swelling 10/26/2017  . Localized swelling, mass, and lump of head 08/18/2017  . Gastroesophageal reflux disease 08/06/2017  . Easy bruising 08/06/2017  . Anxiety 04/24/2017  . Mid back pain 03/26/2017  . Left sided chest pain 03/25/2017  . Dyspnea on exertion 03/14/2017  . Hyperkalemia 03/13/2017  . Submandibular gland swelling 01/08/2017  . High risk medication use 09/02/2016  . Urinary frequency 09/02/2016  . Piriformis muscle pain 09/02/2016  . Fatigue 07/22/2016  . Hair loss 07/22/2016  . Right hip pain 04/08/2016  . Right knee pain 02/25/2016  . Dyspepsia 04/05/2015  . Numbness 11/14/2014  . Biceps tendon tear 10/10/2014  . Insomnia 10/10/2014  . Gait disturbance 06/13/2014  . 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 OP (osteoporosis) 01/18/2014  . Other long term (current) drug therapy 01/18/2014  . Other dysphagia 12/29/2013  . Hoarseness 12/29/2013  . 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 Personal history of colonic polyps 07/03/2010  . Nocturia 05/28/2010  . THYROID NODULE 05/17/2008  . Multiple sclerosis ([place]) 05/17/2008  . Chronic fatigue 03/30/2007  . Hyperlipidemia  08/20/2006  . Essential hypertension 08/20/2006  . Rheumatoid arthritis ([place]) 08/20/2006  . CERVICAL CANCER, HX OF 08/20/2006  . GASTRIC ULCER, HX OF 08/20/2006    Lyndee Hensen, PT, DPT 2:54 PM  12/09/18    [facility] [place], [place], [address] Phone: [phone]   Fax:  [phone]  Name: Sherri [name] MRN: [id number] Date of Birth: Feb 20, 1941

## 2018-12-13 ENCOUNTER — Ambulatory Visit
Admission: RE | Admit: 2018-12-13 | Discharge: 2018-12-13 | Disposition: A | Discharge status: Home / Self Care | Payer: Medicare Other | Source: Ambulatory Visit | Attending: Otolaryngology | Admitting: Otolaryngology

## 2018-12-13 ENCOUNTER — Other Ambulatory Visit: Payer: Self-pay

## 2018-12-13 ENCOUNTER — Encounter: Payer: Medicare Other | Admitting: Physical Therapy

## 2018-12-13 DIAGNOSIS — E041 Nontoxic single thyroid nodule: Secondary | ICD-10-CM | POA: Diagnosis not present

## 2018-12-13 NOTE — Progress Notes (Signed)
Office Visit Note  Patient: Sherri [name]             Date of Birth: 01/02/1941           MRN: [id number]             PCP: Binnie Rail, MD Referring: Binnie Rail, MD Visit Date: 12/27/2018 Occupation: @GUAROCC @  Subjective:  Right wrist pain    History of Present Illness: Sherri [name] is a 78 y.o. female with history of seropositive rheumatoid arthritis and osteoarthritis.  She takes Olumiant 2 mg 1 tablet daily, MTX 6 tablets po once weekly, and prednisone 4 mg po daily.  She continues to have chronic right wrist joint pain.  She states that she fractured her right wrist for the second time in January 2020 and continues to have discomfort.  She states recently she has been cleaning and performing housework which is exacerbated the discomfort.  She says of the right wrist swells intermittently.  She states that she has been sleeping with a compression sleeve on the right wrist which has helped temporarily.  She has been taking tramadol 50 mg twice daily.  She recently had to increase her dose of tramadol due to increased pain.  She uses Voltaren gel topically as needed for pain relief.  She has been going to physical therapy twice a week for left hip pain.  Her left hip was replaced but she fell in January and has had discomfort since.  She has noticed improvement since starting at physical therapy. She continues to get Prolia subcutaneous injections every 6 months.  She takes calcium and vitamin D and D supplements daily.     Activities of Daily Living:  Patient reports morning stiffness for 4 hours.   Patient Denies nocturnal pain.  Difficulty dressing/grooming: Denies Difficulty climbing stairs: Denies Difficulty getting out of chair: Denies Difficulty using hands for taps, buttons, cutlery, and/or writing: Reports  Review of Systems  Constitutional: Negative for fatigue.  HENT: Negative for mouth sores, mouth dryness and nose dryness.   Eyes: Positive for dryness.  Negative for pain and visual disturbance.  Respiratory: Negative for cough, hemoptysis, shortness of breath and difficulty breathing.   Cardiovascular: Negative for chest pain, palpitations, hypertension and swelling in legs/feet.  Gastrointestinal: Negative for blood in stool, constipation and diarrhea.  Endocrine: Negative for excessive thirst and increased urination.  Genitourinary: Negative for painful urination.  Musculoskeletal: Positive for arthralgias, joint pain, joint swelling, morning stiffness and muscle tenderness. Negative for myalgias, muscle weakness and myalgias.  Skin: Negative for color change, pallor, rash, hair loss, nodules/bumps, skin tightness, ulcers and sensitivity to sunlight.  Allergic/Immunologic: Negative for susceptible to infections.  Neurological: Negative for dizziness and headaches.  Hematological: Negative for swollen glands.  Psychiatric/Behavioral: Positive for sleep disturbance. Negative for depressed mood. The patient is not nervous/anxious.     PMFS History:  Patient Active Problem List   Diagnosis Date Noted   Right sided sciatica 04/14/2018   Trochanteric bursitis of right hip 04/14/2018   S/P cervical spinal fusion 11/10/2017   Swelling of left foot 10/26/2017   Dysphagia 10/26/2017   Left ankle swelling 10/26/2017   Localized swelling, mass, and lump of head 08/18/2017   Gastroesophageal reflux disease 08/06/2017   Easy bruising 08/06/2017   Anxiety 04/24/2017   Mid back pain 03/26/2017   Left sided chest pain 03/25/2017   Dyspnea on exertion 03/14/2017   Hyperkalemia 03/13/2017   Submandibular gland swelling 01/08/2017  High risk medication use 09/02/2016   Urinary frequency 09/02/2016   Piriformis muscle pain 09/02/2016   Fatigue 07/22/2016   Hair loss 07/22/2016   Right hip pain 04/08/2016   Right knee pain 02/25/2016   Dyspepsia 04/05/2015   Numbness 11/14/2014   Biceps tendon tear 10/10/2014    Insomnia 10/10/2014   Gait disturbance 06/13/2014   Depression with anxiety 06/13/2014   OP (osteoporosis) 01/18/2014   Other long term (current) drug therapy 01/18/2014   Other dysphagia 12/29/2013   Hoarseness 12/29/2013   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 01/01/2013   Personal history of colonic polyps 07/03/2010   Nocturia 05/28/2010   THYROID NODULE 05/17/2008   Multiple sclerosis ([place]) 05/17/2008   Chronic fatigue 03/30/2007   Hyperlipidemia 08/20/2006   Essential hypertension 08/20/2006   Rheumatoid arthritis ([place]) 08/20/2006   CERVICAL CANCER, HX OF 08/20/2006   GASTRIC ULCER, HX OF 08/20/2006    Past Medical History:  Diagnosis Date   Anxiety    takes Valium daily as needed   Bruises easily    d/t meds   Cancer ([place])    basal cell ca, in situ- uterine    Cataracts, bilateral    Chronic back pain    Dizziness    r/t to meds   GERD (gastroesophageal reflux disease)    no meds on a regular basis but will take Tums if needed   Headache(784.0)    r/t neck issues   History of bronchitis 6-10yrs ago   History of colon polyps    benign   Hyperlipidemia    takes Atorvastatin on Mondays and Fridays   Hypertension    takes Ramipril daily   Joint pain    Joint swelling    Multiple sclerosis ([place])    Neuromuscular disorder ([place])    Dr. [place] Liter- [facility], follows [facility]   Nocturia    OA (osteoarthritis)    Dr Jani Gravel weekly, RA- hands- knees- feet    PONV (postoperative nausea and vomiting)    trouble urinating after surgery in 2014   Rheumatoid arthritis(714.0)    Dr Estanislado Pandy takes [name] daily   Right wrist fracture    Skin cancer    Urinary retention    sees Dr.Wrenn about 2 times a yr    Family History  Problem Relation Age of Onset   Cancer Mother        pancreatic   Diabetes Mother    Heart disease Father        Rheumatic   Cancer Father        ? stomach   Cancer Sister         stomach   Cancer Maternal Aunt        X 4; ? primary   Heart disease Paternal Aunt    Cancer Maternal Grandmother        cervical   Colon cancer Other        Aunts   Multiple sclerosis Daughter    Hypertension Neg Hx    Stroke Neg Hx    Past Surgical History:  Procedure Laterality Date   ABDOMINAL HYSTERECTOMY     1985   APPENDECTOMY     with TAH   BACK SURGERY     several   BREAST BIOPSY Right    Results were negative   BROW LIFT Bilateral 10/02/2016   Procedure: BILATERAL LOWER LID BLEPHAROPLASTY;  Surgeon: Clista Bernhardt, MD;  Location: [facility];  Service: Plastics;  Laterality: Bilateral;  CARPAL TUNNEL RELEASE Right    cataracts     CERVICAL FUSION     Dr Joya Salm   CERVICAL FUSION  12/31/2012   Dr Joya Salm   CHEST TUBE INSERTION     for traumatic Pneumothorax   COLONOSCOPY  07/16/2010   normal    COLONOSCOPY W/ POLYPECTOMY  1997   negative since; Dr Deatra Ina   ESOPHAGOGASTRODUODENOSCOPY  07/16/2010   normal   eye lid raise     EYE SURGERY Bilateral    cataracts removed - /w IOL   JOINT REPLACEMENT     LUMBAR FUSION     Dr Joya Salm   NASAL SINUS SURGERY     POSTERIOR CERVICAL FUSION/FORAMINOTOMY N/A 12/31/2012   Procedure: POSTERIOR LATERAL CERVICAL FUSION/FORAMINOTOMY LEVEL 1 CERVICAL THREE-FOUR WITH LATERAL MASS SCREWS;  Surgeon: Floyce Stakes, MD;  Location: [facility];  Service: Neurosurgery;  Laterality: N/A;   PTOSIS REPAIR Bilateral 10/02/2016   Procedure: INTERNAL PTOSIS REPAIR;  Surgeon: Clista Bernhardt, MD;  Location: [facility];  Service: Plastics;  Laterality: Bilateral;   SKIN BIOPSY     THYROID SURGERY     R lobe removed- 1972, has grown back - CT last done- 2018   TOTAL HIP ARTHROPLASTY  12/22/2011   Procedure: TOTAL HIP ARTHROPLASTY;  Surgeon: Kerin Salen, MD;  Location: [facility];  Service: Orthopedics;  Laterality: Left;   TOTAL SHOULDER ARTHROPLASTY     TUBAL LIGATION     Social History   Social History Narrative    Not on file   Immunization History  Administered Date(s) Administered   Influenza Whole 02/22/2007, 02/08/2013   Influenza, High Dose Seasonal PF 02/04/2015, 02/18/2017, 02/21/2018   Influenza-Unspecified 01/24/2014, 01/25/2016, 02/18/2017   Pneumococcal Conjugate-13 11/23/2012, 09/08/2014   Pneumococcal Polysaccharide-23 05/26/2006   Pneumococcal-Unspecified 12/28/2012   Td 05/28/2010   Zoster 05/26/2009     Objective: Vital Signs: BP (!) 101/56 (BP Location: Left Arm, Patient Position: Sitting, Cuff Size: Normal)    Pulse 63    Resp 16    Ht 5\' 2"  (1.575 m)    Wt 118 lb 9.6 oz (53.8 kg)    BMI 21.69 kg/m    Physical Exam Vitals signs and nursing note reviewed.  Constitutional:      Appearance: She is well-developed.  HENT:     Head: Normocephalic and atraumatic.  Eyes:     Conjunctiva/sclera: Conjunctivae normal.  Neck:     Musculoskeletal: Normal range of motion.  Cardiovascular:     Rate and Rhythm: Normal rate and regular rhythm.     Heart sounds: Normal heart sounds.  Pulmonary:     Effort: Pulmonary effort is normal.     Breath sounds: Normal breath sounds.  Abdominal:     General: Bowel sounds are normal.     Palpations: Abdomen is soft.  Lymphadenopathy:     Cervical: No cervical adenopathy.  Skin:    General: Skin is warm and dry.     Capillary Refill: Capillary refill takes less than 2 seconds.  Neurological:     Mental Status: She is alert and oriented to person, place, and time.  Psychiatric:        Behavior: Behavior normal.      Musculoskeletal Exam:  C-spine limited ROM.  Thoracic and lumbar spine good ROM.  No midline spinal tenderness.  No SI joint tenderness.  Shoulder joints and elbow joints have good ROM.  No tenderness or inflammation of elbow joints.  Limited flexion of the  right wrist joint. Tenderness over the radial aspect of the right wrist. PIP and DIP synovial thickening consistent with osteoarthritis of both hands.  Left hip  replacement has good ROM.  Right hip has good ROM. Knee joints, ankle joints, MTPs PIPs, and DIPs good ROM with no synovitis.  No warmth or effusion of knee joints.  No tenderness or swelling of ankle joints.   CDAI Exam: CDAI Score: 6.8  Patient Global: 4 mm; Provider Global: 4 mm Swollen: 3 ; Tender: 3  Joint Exam      Right  Left  Wrist  Swollen Tender     PIP 2  Swollen Tender     PIP 4  Swollen Tender      There is currently no information documented on the homunculus. Go to the Rheumatology activity and complete the homunculus joint exam.  Investigation: No additional findings.  Imaging: US Thyroid  Result Date: 12/13/2018 CLINICAL DATA:  Nodule. Previous FNA biopsy of mid right nodule 09/17/2017 and 10/29/2017 EXAM: THYROID ULTRASOUND TECHNIQUE: Ultrasound examination of the thyroid gland and adjacent soft tissues was performed. COMPARISON:  08/28/2017, 06/19/2015 FINDINGS: Parenchymal Echotexture: Mildly heterogenous, hyperemic Isthmus: 0.1 cm thickness, previously 0.2 Right lobe: 3.1 x 1.1 x 1.4 cm, previously 3.2 x 1 x 1.4 Left lobe: 2.1 x 2.5 x 1.2 cm, previously 3.5 x 0.8 x 1.5 _________________________________________________________ Estimated total number of nodules >/= 1 cm: 1 Number of spongiform nodules >/=  2 cm not described below (TR1): 0 Number of mixed cystic and solid nodules >/= 1.5 cm not described below ([place]): 0 _________________________________________________________ Nodule # 1: 1 x 0.9 x 0.8 cm mid right, previously 1.3 x 1.3 x 1.2; this has previously been biopsied IMPRESSION: 1. Slight decrease in size of solitary mid right nodule, which has previously been biopsied. The above is in keeping with the ACR TI-RADS recommendations - J Am Coll Radiol 2017;14:587-595. Electronically Signed   By: Lucrezia Europe M.D.   On: 12/13/2018 15:23    Recent Labs: Lab Results  Component Value Date   WBC 5.0 11/03/2018   HGB 12.6 11/03/2018   PLT 314 11/03/2018   NA 138  11/22/2018   K 4.8 11/22/2018   CL 100 11/22/2018   CO2 28 11/22/2018   GLUCOSE 87 11/22/2018   BUN 15 11/22/2018   CREATININE 0.89 11/22/2018   BILITOT 0.5 11/22/2018   ALKPHOS 47 10/26/2017   AST 25 11/22/2018   ALT 13 11/22/2018   PROT 6.5 11/22/2018   ALBUMIN 4.3 10/26/2017   CALCIUM 9.4 11/22/2018   GFRAA 73 11/22/2018   QFTBGOLDPLUS NEGATIVE 11/03/2018    Speciality Comments: Prior therapy includes: [name] (cost), [name] (cost)  Procedures:  No procedures performed Allergies: Darvon [propoxyphene hcl], Meperidine hcl, Penicillins, Azathioprine, Leflunomide, and Ezetimibe-simvastatin     Assessment / Plan:     Visit Diagnoses: Rheumatoid arthritis involving multiple sites with positive rheumatoid factor ([place]) - She has synovitis on the volar aspect of the right wrist and the right 2nd and 4th PIP joints.  Overall she is clinically doing well on 11 and 2 mg 1 tablet by mouth daily, methotrexate 6 tablets by mouth once weekly, and prednisone 4 mg by mouth daily.  She has not missed any doses of her medications recently.  She is been having increased pain in the right wrist and right hand which she attributes to overuse activities since she has been cleaning and performing housework more during quarantine.  She is increased her dose of tramadol  to 50 mg twice daily for pain relief and has been using Voltaren gel topically as needed.  She is also been wearing a compression brace at night for the right wrist discomfort.  She will continue on the current treatment regimen.  Will not make any changes at this time.  She does not need any refills.  She was advised to notify us if develops increased joint pain or joint swelling.  She will follow-up in the office in 5 months.  High risk medication use - Olumiant 2 mg 1 tablet daily (started in October 2019), methotrexate 2.5 mg 6 tablets every 7 days, folic acid 1 mg 2 tablets daily, and prednisone 9 mg daily.  Last TB gold negative on  11/03/2018 and will monitor yearly.  Most recent CBC/CMP stable on 11/03/2018.  Due for CBC/CMP in September and will monitor every 3 months.  Standing orders are in place.  She received a high-dose flu vaccine in September and previously Prevnar 13, Pneumovax 23, and Zostavax.  She was advised to hold Olumiant and MTX if she develops any signs or symptoms of an infection and to resume once the infection has cleared.   Age-related osteoporosis without current pathological fracture -She is on Prolia 60 mg every 6 months with last injection on 11/12/2018.  She is also on a calcium and vitamin D supplement daily. DEXA on 01/11/2018 showed T score of -3.9 at radius with BMD of 0.457.Negative 7.58% change in BMD from previous DEXA on 10/15/15.Last vitamin D level 57 on 08/31/2017.  Due for repeat DEXA in August 2021.  Closed fracture of right wrist with routine healing, subsequent encounter - She continues to have pain in her right wrist joint. She has limited flexion with discomfort. She has tenderness on the volar aspect.  No inflammation was noted.  She has been wearing a compression brace at night for pain relief.  She is also to increase her dose of tramadol to 50 mg twice daily.  She was encouraged to continue to to wear the brace at night and use Voltaren gel topically as needed for pain relief.  Primary osteoarthritis of right hip - She has good range of motion with no discomfort.  History of total hip replacement, left - She has been going to physical therapy twice a week after her fall in January.  She has noticed improvement since going to physical therapy.  She has good range of motion on exam today with no discomfort.  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 - She has limited ROM of the c-spine, especially with lateral rotation.  She has no symptoms of radiculopathy at this time.   DDD (degenerative disc disease), lumbar - She has no midline spinal tenderness.  She has good ROM with no discomfort  at this time.   Trochanteric bursitis of right hip - She has no tenderness on exam today.   Other fatigue - Chronic   Other medical conditions are listed as follows:   History of multiple sclerosis   Primary insomnia  History of hyperlipidemia   History of depression   History of anxiety   History of hypertension   Orders: No orders of the defined types were placed in this encounter.  No orders of the defined types were placed in this encounter.     Follow-Up Instructions: Return in about 5 months (around 05/29/2019) for Rheumatoid arthritis, Osteoporosis, Osteoarthritis.   Ofilia Neas, PA-C  Note - This record has been created using Dragon software.  Chart creation errors have been sought, but may not always  have been located. Such creation errors do not reflect on  the standard of medical care.

## 2018-12-15 ENCOUNTER — Encounter: Payer: Self-pay | Admitting: Physical Therapy

## 2018-12-15 ENCOUNTER — Ambulatory Visit (INDEPENDENT_AMBULATORY_CARE_PROVIDER_SITE_OTHER): Payer: Medicare Other | Admitting: Physical Therapy

## 2018-12-15 DIAGNOSIS — R2689 Other abnormalities of gait and mobility: Secondary | ICD-10-CM

## 2018-12-15 DIAGNOSIS — M25552 Pain in left hip: Secondary | ICD-10-CM

## 2018-12-15 NOTE — Patient Instructions (Signed)
Access Code: [email or web address]  URL: [email or web address]  Date: 12/15/2018  Prepared by: Lyndee Hensen   Exercises Supine March - 10 reps - 2 sets - 1x daily Supine Bridge - 10 reps - 2 sets - 1x daily Clamshell - 10 reps - 2 sets - 1x daily Supine Hip Adduction Isometric with Ball - 10 reps - 2 sets - 1x daily Hooklying Clamshell with Resistance - 10 reps - 2 sets - 1x daily Standing Hip Abduction - 10 reps - 2 sets - 1x daily Standing Hip Extension - 10 reps - 2 sets - 1x daily Sidelying Hip Abduction - 10 reps - 2 sets - 1x daily Sideways Walking

## 2018-12-16 ENCOUNTER — Ambulatory Visit (INDEPENDENT_AMBULATORY_CARE_PROVIDER_SITE_OTHER): Payer: Medicare Other | Admitting: Physical Therapy

## 2018-12-16 ENCOUNTER — Encounter: Payer: Self-pay | Admitting: Physical Therapy

## 2018-12-16 DIAGNOSIS — R2689 Other abnormalities of gait and mobility: Secondary | ICD-10-CM | POA: Diagnosis not present

## 2018-12-16 DIAGNOSIS — M25552 Pain in left hip: Secondary | ICD-10-CM | POA: Diagnosis not present

## 2018-12-16 NOTE — Therapy (Signed)
[facility] [address] [place], [place], [address] Phone: [phone]   Fax:  [phone]  Physical Therapy Treatment  Patient Details  Name: Sherri [name] MRN: [id number] Date of Birth: 05-03-1941 Referring Provider (PT): Dr. Ninfa Linden   Encounter Date: 12/16/2018  PT End of Session - 12/16/18 1419    Visit Number  6    Number of Visits  12    Date for PT Re-Evaluation  01/05/19    Authorization Type  [employer]    PT Start Time  1155    PT Stop Time  1243    PT Time Calculation (min)  48 min    Activity Tolerance  Patient tolerated treatment well    Behavior During Therapy  [facility] for tasks assessed/performed       Past Medical History:  Diagnosis Date  . Anxiety    takes Valium daily as needed  . Bruises easily    d/t meds  . Cancer ([place])    basal cell ca, in situ- uterine   . Cataracts, bilateral   . Chronic back pain   . Dizziness    r/t to meds  . GERD (gastroesophageal reflux disease)    no meds on a regular basis but will take Tums if needed  . Headache(784.0)    r/t neck issues  . History of bronchitis 6-86yrs ago  . History of colon polyps    benign  . Hyperlipidemia    takes Atorvastatin on Mondays and Fridays  . Hypertension    takes Ramipril daily  . Joint pain   . Joint swelling   . Multiple sclerosis ([place])   . Neuromuscular disorder ([place])    Dr. Park Liter- [facility], follows M.S.  . Nocturia   . OA (osteoarthritis)    Dr Jani Gravel weekly, RA- hands- knees- feet   . PONV (postoperative nausea and vomiting)    trouble urinating after surgery in 2014  . Rheumatoid arthritis(714.0)    Dr Estanislado Pandy takes [name] daily  . Right wrist fracture   . Skin cancer   . Urinary retention    sees Dr.Wrenn about 2 times a yr    Past Surgical History:  Procedure Laterality Date  . ABDOMINAL HYSTERECTOMY     1985  . APPENDECTOMY     with TAH  . BACK SURGERY     several  . BREAST BIOPSY  Right    Results were negative  . BROW LIFT Bilateral 10/02/2016   Procedure: BILATERAL LOWER LID BLEPHAROPLASTY;  Surgeon: Clista Bernhardt, MD;  Location: [facility];  Service: Plastics;  Laterality: Bilateral;  . CARPAL TUNNEL RELEASE Right   . cataracts    . CERVICAL FUSION     Dr Joya Salm  . CERVICAL FUSION  12/31/2012   Dr Joya Salm  . CHEST TUBE INSERTION     for traumatic Pneumothorax  . COLONOSCOPY  07/16/2010   normal   . COLONOSCOPY W/ POLYPECTOMY  1997   negative since; Dr Deatra Ina  . ESOPHAGOGASTRODUODENOSCOPY  07/16/2010   normal  . eye lid raise    . EYE SURGERY Bilateral    cataracts removed - /w IOL  . JOINT REPLACEMENT    . LUMBAR FUSION     Dr Joya Salm  . NASAL SINUS SURGERY    . POSTERIOR CERVICAL FUSION/FORAMINOTOMY N/A 12/31/2012   Procedure: POSTERIOR LATERAL CERVICAL FUSION/FORAMINOTOMY LEVEL 1 CERVICAL THREE-FOUR WITH LATERAL MASS SCREWS;  Surgeon: Floyce Stakes, MD;  Location: [facility];  Service: Neurosurgery;  Laterality: N/A;  . PTOSIS REPAIR Bilateral 10/02/2016   Procedure: INTERNAL PTOSIS REPAIR;  Surgeon: Clista Bernhardt, MD;  Location: [facility];  Service: Plastics;  Laterality: Bilateral;  . SKIN BIOPSY    . THYROID SURGERY     R lobe removed- 1972, has grown back - CT last done- 2018  . TOTAL HIP ARTHROPLASTY  12/22/2011   Procedure: TOTAL HIP ARTHROPLASTY;  Surgeon: Kerin Salen, MD;  Location: [facility];  Service: Orthopedics;  Laterality: Left;  . TOTAL SHOULDER ARTHROPLASTY    . TUBAL LIGATION      There were no vitals filed for this visit.  Subjective Assessment - 12/16/18 1418    Subjective  Pt states soreness in L posterior hip/glute today. States she is tired from all the cleaning she is doing.    Currently in Pain?  Yes    Pain Score  3     Pain Location  Hip    Pain Orientation  Left    Pain Descriptors / Indicators  Aching    Pain Type  Chronic pain    Pain Onset  More than a month ago    Pain Frequency  Intermittent                        [facility] Adult PT Treatment/Exercise - 12/16/18 0001      Knee/Hip Exercises: Stretches   Piriformis Stretch  2 reps;30 seconds    Piriformis Stretch Limitations  seated fig 4      Knee/Hip Exercises: Aerobic   Stationary Bike  L1 x 8 min      Knee/Hip Exercises: Supine   Bridges with Ball Squeeze  20 reps    Other Supine Knee/Hip Exercises  Clam GTB x20 alternating;  Supine March with RTB x20;        Manual Therapy   Manual Therapy  Soft tissue mobilization    Manual therapy comments  skilled palpation and monitoring of soft tissue during dry needling.     Soft tissue mobilization  DTM and IASTM to L posterior hip and glutes       Trigger Point Dry Needling - 12/16/18 0001    Consent Given?  Yes    Education Handout Provided  Yes    Muscles Treated Back/Hip  Gluteus maximus;Piriformis;Obturator internus    Gluteus Maximus Response  Palpable increased muscle length   L   Piriformis Response  Palpable increased muscle length   L   Obturator internus Response  Palpable increased muscle length   L          PT Education - 12/16/18 1419    Education Details  DN use, benefits, risks, precautions.    Person(s) Educated  Patient    Methods  Explanation;Handout    Comprehension  Verbalized understanding       PT Short Term Goals - 12/16/18 1420      PT SHORT TERM GOAL #1   Title  Pt to be independent with initial HEP    Time  2    Period  Weeks    Status  Achieved    Target Date  12/08/18        PT Long Term Goals - 11/25/18 1201      PT LONG TERM GOAL #1   Title  Pt to be independent for long term HEP of strengthening for hips.    Time  6    Period  Weeks  Status  New    Target Date  01/05/19      PT LONG TERM GOAL #2   Title  Pt with report decreased pain to 0-2/10 with transfers, standing, and walking activity, to improve ability for IADLs.    Time  6    Period  Weeks    Status  New    Target Date  01/05/19      PT  LONG TERM GOAL #3   Title  Pt to demo increased strength of L hip to br 5/5, to improve stability, gait, and pain.    Time  6    Period  Weeks    Status  New    Target Date  01/05/19      PT LONG TERM GOAL #4   Title  Pt to demo proper lifting/squat mechanics, to decrease risk for posterior hip instability.    Time  6    Period  Weeks    Status  New    Target Date  01/05/19            Plan - 12/16/18 1422    Clinical Impression Statement  Pt with tenderness and tightness in L glute/pirirofmis and hip rotators. She had good tolerance for dry needling today. Ther ex progressed for strength and stabilization. Plan to progress as tolerated.    Personal Factors and Comorbidities  Comorbidity 1;Comorbidity 2;Other    Comorbidities  L TKA, osteoporosis, multiple previous fx, MS, RA, R hip labral tear    Examination-Activity Limitations  Locomotion Level;Transfers;Bed Mobility;Sit;Squat;Stairs;Stand;Lift    Examination-Participation Restrictions  Meal Prep;Cleaning;Community Activity;Shop;Laundry;Yard Work    [employer]  Evolving/Moderate complexity    Rehab Potential  Good    PT Frequency  2x / week    PT Duration  6 weeks    PT Treatment/Interventions  ADLs/Self Care Home Management;Cryotherapy;Electrical Stimulation;Ultrasound;Traction;Moist Heat;Iontophoresis 4mg /ml Dexamethasone;Gait training;Stair training;Functional mobility training;Therapeutic activities;Therapeutic exercise;Orthotic Fit/Training;Patient/family education;Neuromuscular re-education;Manual techniques;Passive range of motion;Dry needling;Spinal Manipulations;Taping;Joint Manipulations    Consulted and Agree with Plan of Care  Patient       Patient will benefit from skilled therapeutic intervention in order to improve the following deficits and impairments:  Abnormal gait, Difficulty walking, Increased muscle spasms, Decreased endurance, Decreased activity tolerance, Pain, Decreased balance,  Impaired flexibility, Improper body mechanics, Decreased strength, Decreased mobility  Visit Diagnosis: 1. Pain in left hip   2. Other abnormalities of gait and mobility        Problem List Patient Active Problem List   Diagnosis Date Noted  . Right sided sciatica 04/14/2018  . Trochanteric bursitis of right hip 04/14/2018  . S/P cervical spinal fusion 11/10/2017  . Swelling of left foot 10/26/2017  . Dysphagia 10/26/2017  . Left ankle swelling 10/26/2017  . Localized swelling, mass, and lump of head 08/18/2017  . Gastroesophageal reflux disease 08/06/2017  . Easy bruising 08/06/2017  . Anxiety 04/24/2017  . Mid back pain 03/26/2017  . Left sided chest pain 03/25/2017  . Dyspnea on exertion 03/14/2017  . Hyperkalemia 03/13/2017  . Submandibular gland swelling 01/08/2017  . High risk medication use 09/02/2016  . Urinary frequency 09/02/2016  . Piriformis muscle pain 09/02/2016  . Fatigue 07/22/2016  . Hair loss 07/22/2016  . Right hip pain 04/08/2016  . Right knee pain 02/25/2016  . Dyspepsia 04/05/2015  . Numbness 11/14/2014  . Biceps tendon tear 10/10/2014  . Insomnia 10/10/2014  . Gait disturbance 06/13/2014  . 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 OP (osteoporosis) 01/18/2014  .  Other long term (current) drug therapy 01/18/2014  . Other dysphagia 12/29/2013  . Hoarseness 12/29/2013  . 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 Personal history of colonic polyps 07/03/2010  . Nocturia 05/28/2010  . THYROID NODULE 05/17/2008  . Multiple sclerosis ([place]) 05/17/2008  . Chronic fatigue 03/30/2007  . Hyperlipidemia 08/20/2006  . Essential hypertension 08/20/2006  . Rheumatoid arthritis ([place]) 08/20/2006  . CERVICAL CANCER, HX OF 08/20/2006  . GASTRIC ULCER, HX OF 08/20/2006    Lyndee Hensen, PT, DPT 2:23 PM  12/16/18    [facility] [place], [place], [address] Phone: [phone]   Fax:   [phone]  Name: Sherri [name] MRN: [id number] Date of Birth: 10/07/1940

## 2018-12-16 NOTE — Therapy (Signed)
[facility] [address] [place], [place], [address] Phone: [phone]   Fax:  [phone]  Physical Therapy Treatment  Patient Details  Name: Sherri [name] MRN: [id number] Date of Birth: 28-Sep-1940 Referring Provider (PT): Dr. Ninfa Linden   Encounter Date: 12/15/2018  PT End of Session - 12/15/18 1404    Visit Number  5    Number of Visits  12    Date for PT Re-Evaluation  01/05/19    Authorization Type  UHC    PT Start Time  1962    PT Stop Time  1444    PT Time Calculation (min)  49 min    Activity Tolerance  Patient tolerated treatment well    Behavior During Therapy  [facility] for tasks assessed/performed       Past Medical History:  Diagnosis Date  . Anxiety    takes Valium daily as needed  . Bruises easily    d/t meds  . Cancer ([place])    basal cell ca, in situ- uterine   . Cataracts, bilateral   . Chronic back pain   . Dizziness    r/t to meds  . GERD (gastroesophageal reflux disease)    no meds on a regular basis but will take Tums if needed  . Headache(784.0)    r/t neck issues  . History of bronchitis 6-24yrs ago  . History of colon polyps    benign  . Hyperlipidemia    takes Atorvastatin on Mondays and Fridays  . Hypertension    takes Ramipril daily  . Joint pain   . Joint swelling   . Multiple sclerosis ([place])   . Neuromuscular disorder ([place])    Dr. Park Liter- [facility], follows M.S.  . Nocturia   . OA (osteoarthritis)    Dr Jani Gravel weekly, RA- hands- knees- feet   . PONV (postoperative nausea and vomiting)    trouble urinating after surgery in 2014  . Rheumatoid arthritis(714.0)    Dr Estanislado Pandy takes [name] daily  . Right wrist fracture   . Skin cancer   . Urinary retention    sees Dr.Wrenn about 2 times a yr    Past Surgical History:  Procedure Laterality Date  . ABDOMINAL HYSTERECTOMY     1985  . APPENDECTOMY     with TAH  . BACK SURGERY     several  . BREAST BIOPSY  Right    Results were negative  . BROW LIFT Bilateral 10/02/2016   Procedure: BILATERAL LOWER LID BLEPHAROPLASTY;  Surgeon: Clista Bernhardt, MD;  Location: [facility];  Service: Plastics;  Laterality: Bilateral;  . CARPAL TUNNEL RELEASE Right   . cataracts    . CERVICAL FUSION     Dr Joya Salm  . CERVICAL FUSION  12/31/2012   Dr Joya Salm  . CHEST TUBE INSERTION     for traumatic Pneumothorax  . COLONOSCOPY  07/16/2010   normal   . COLONOSCOPY W/ POLYPECTOMY  1997   negative since; Dr Deatra Ina  . ESOPHAGOGASTRODUODENOSCOPY  07/16/2010   normal  . eye lid raise    . EYE SURGERY Bilateral    cataracts removed - /w IOL  . JOINT REPLACEMENT    . LUMBAR FUSION     Dr Joya Salm  . NASAL SINUS SURGERY    . POSTERIOR CERVICAL FUSION/FORAMINOTOMY N/A 12/31/2012   Procedure: POSTERIOR LATERAL CERVICAL FUSION/FORAMINOTOMY LEVEL 1 CERVICAL THREE-FOUR WITH LATERAL MASS SCREWS;  Surgeon: Floyce Stakes, MD;  Location: [facility];  Service: Neurosurgery;  Laterality: N/A;  . PTOSIS REPAIR Bilateral 10/02/2016   Procedure: INTERNAL PTOSIS REPAIR;  Surgeon: Clista Bernhardt, MD;  Location: [facility];  Service: Plastics;  Laterality: Bilateral;  . SKIN BIOPSY    . THYROID SURGERY     R lobe removed- 1972, has grown back - CT last done- 2018  . TOTAL HIP ARTHROPLASTY  12/22/2011   Procedure: TOTAL HIP ARTHROPLASTY;  Surgeon: Kerin Salen, MD;  Location: [facility];  Service: Orthopedics;  Laterality: Left;  . TOTAL SHOULDER ARTHROPLASTY    . TUBAL LIGATION      There were no vitals filed for this visit.  Subjective Assessment - 12/15/18 1402    Subjective  Pt states back is sore today, but hip is not very sore today. She has been doing HEP.    Limitations  Lifting;Standing;Walking;House hold activities    Patient Stated Goals  increased strength, decreased pain    Currently in Pain?  Yes    Pain Score  3     Pain Location  Back    Pain Orientation  Left    Pain Descriptors / Indicators  Aching    Pain Type   Chronic pain    Pain Onset  More than a month ago    Pain Frequency  Intermittent    Effect of Pain on Daily Activities  States "sciatic " pain today                       [facility] Adult PT Treatment/Exercise - 12/15/18 1405      Ambulation/Gait   Gait Comments  --      Exercises   Exercises  Knee/Hip      Knee/Hip Exercises: Stretches   Piriformis Stretch  2 reps;30 seconds    Piriformis Stretch Limitations  seated fig 4      Knee/Hip Exercises: Aerobic   Stationary Bike  L1 x 8 min      Knee/Hip Exercises: Standing   Heel Raises  --    Hip Abduction  Both;10 reps;2 sets    Hip Extension  10 reps;2 sets;Both    Functional Squat  10 reps;2 sets    Other Standing Knee Exercises  squat/lift 5lb x5, 10lb x5;       Knee/Hip Exercises: Seated   Long Arc Quad  --      Knee/Hip Exercises: Supine   Hip Adduction Isometric  --    Constance Haw  --    Bridges with Clamshell  10 reps;2 sets;Both    Other Supine Knee/Hip Exercises  Clam GTB x20 alternating;  Supine March with RTB x20;        Knee/Hip Exercises: Sidelying   Hip ABduction  10 reps;2 sets;Both             PT Education - 12/15/18 1458    Education Details  HEP updated    Person(s) Educated  Patient    Methods  Explanation;Handout    Comprehension  Verbalized understanding       PT Short Term Goals - 11/24/18 1305      PT SHORT TERM GOAL #1   Title  Pt to be independent with initial HEP    Time  2    Period  Weeks    Status  New    Target Date  12/08/18        PT Long Term Goals - 11/25/18 1201      PT LONG TERM GOAL #  1   Title  Pt to be independent for long term HEP of strengthening for hips.    Time  6    Period  Weeks    Status  New    Target Date  01/05/19      PT LONG TERM GOAL #2   Title  Pt with report decreased pain to 0-2/10 with transfers, standing, and walking activity, to improve ability for IADLs.    Time  6    Period  Weeks    Status  New    Target Date  01/05/19       PT LONG TERM GOAL #3   Title  Pt to demo increased strength of L hip to br 5/5, to improve stability, gait, and pain.    Time  6    Period  Weeks    Status  New    Target Date  01/05/19      PT LONG TERM GOAL #4   Title  Pt to demo proper lifting/squat mechanics, to decrease risk for posterior hip instability.    Time  6    Period  Weeks    Status  New    Target Date  01/05/19            Plan - 12/15/18 1500    Clinical Impression Statement  Pt able to perform all ther ex today, with no increased pain. She does have noted asymmetry with weight bearing/weight shift with squat motion, with shift to the R, and noted difference in length of LE/femur. She has some tenderness in l glute, will address tomorrow at next session with Manual as needed. Pt improving with strength and has been able to progress strength and stability.    Personal Factors and Comorbidities  Comorbidity 1;Comorbidity 2;Other    Comorbidities  L TKA, osteoporosis, multiple previous fx, MS, RA, R hip labral tear    Examination-Activity Limitations  Locomotion Level;Transfers;Bed Mobility;Sit;Squat;Stairs;Stand;Lift    Examination-Participation Restrictions  Meal Prep;Cleaning;Community Activity;Shop;Laundry;Yard Work    [employer] officer  Evolving/Moderate complexity    Rehab Potential  Good    PT Frequency  2x / week    PT Duration  6 weeks    PT Treatment/Interventions  ADLs/Self Care Home Management;Cryotherapy;Electrical Stimulation;Ultrasound;Traction;Moist Heat;Iontophoresis 4mg /ml Dexamethasone;Gait training;Stair training;Functional mobility training;Therapeutic activities;Therapeutic exercise;Orthotic Fit/Training;Patient/family education;Neuromuscular re-education;Manual techniques;Passive range of motion;Dry needling;Spinal Manipulations;Taping;Joint Manipulations    Consulted and Agree with Plan of Care  Patient       Patient will benefit from skilled therapeutic intervention in  order to improve the following deficits and impairments:  Abnormal gait, Difficulty walking, Increased muscle spasms, Decreased endurance, Decreased activity tolerance, Pain, Decreased balance, Impaired flexibility, Improper body mechanics, Decreased strength, Decreased mobility  Visit Diagnosis: 1. Pain in left hip   2. Other abnormalities of gait and mobility        Problem List Patient Active Problem List   Diagnosis Date Noted  . Right sided sciatica 04/14/2018  . Trochanteric bursitis of right hip 04/14/2018  . S/P cervical spinal fusion 11/10/2017  . Swelling of left foot 10/26/2017  . Dysphagia 10/26/2017  . Left ankle swelling 10/26/2017  . Localized swelling, mass, and lump of head 08/18/2017  . Gastroesophageal reflux disease 08/06/2017  . Easy bruising 08/06/2017  . Anxiety 04/24/2017  . Mid back pain 03/26/2017  . Left sided chest pain 03/25/2017  . Dyspnea on exertion 03/14/2017  . Hyperkalemia 03/13/2017  . Submandibular gland swelling 01/08/2017  . High risk medication use 09/02/2016  .  Urinary frequency 09/02/2016  . Piriformis muscle pain 09/02/2016  . Fatigue 07/22/2016  . Hair loss 07/22/2016  . Right hip pain 04/08/2016  . Right knee pain 02/25/2016  . Dyspepsia 04/05/2015  . Numbness 11/14/2014  . Biceps tendon tear 10/10/2014  . Insomnia 10/10/2014  . Gait disturbance 06/13/2014  . 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 OP (osteoporosis) 01/18/2014  . Other long term (current) drug therapy 01/18/2014  . Other dysphagia 12/29/2013  . Hoarseness 12/29/2013  . 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 Personal history of colonic polyps 07/03/2010  . Nocturia 05/28/2010  . THYROID NODULE 05/17/2008  . Multiple sclerosis ([place]) 05/17/2008  . Chronic fatigue 03/30/2007  . Hyperlipidemia 08/20/2006  . Essential hypertension 08/20/2006  . Rheumatoid arthritis ([place]) 08/20/2006  . CERVICAL CANCER, HX OF 08/20/2006  . GASTRIC ULCER, HX OF  08/20/2006   Lyndee Hensen, PT, DPT 8:51 AM  12/16/18    [facility] [place], [place], [address] Phone: [phone]   Fax:  [phone]  Name: MARCINA KINNISON MRN: [id number] Date of Birth: 1941/01/30

## 2018-12-22 ENCOUNTER — Ambulatory Visit (INDEPENDENT_AMBULATORY_CARE_PROVIDER_SITE_OTHER): Payer: Medicare Other | Admitting: Physical Therapy

## 2018-12-22 ENCOUNTER — Encounter: Payer: Self-pay | Admitting: Physical Therapy

## 2018-12-22 DIAGNOSIS — M25552 Pain in left hip: Secondary | ICD-10-CM

## 2018-12-22 DIAGNOSIS — R2689 Other abnormalities of gait and mobility: Secondary | ICD-10-CM | POA: Diagnosis not present

## 2018-12-22 NOTE — Therapy (Signed)
[facility] [address] [place], [place], [address] Phone: [phone]   Fax:  [phone]  Physical Therapy Treatment  Patient Details  Name: Sherri [name] MRN: [id number] Date of Birth: [birth year] Referring Provider (PT): Dr. Ninfa Linden   Encounter Date: 12/22/2018  PT End of Session - 12/22/18 1411    Visit Number  7    Number of Visits  12    Date for PT Re-Evaluation  01/05/19    Authorization Type  UHC    PT Start Time  1403    PT Stop Time  1445    PT Time Calculation (min)  42 min    Activity Tolerance  Patient tolerated treatment well    Behavior During Therapy  [facility] for tasks assessed/performed       Past Medical History:  Diagnosis Date  . Anxiety    takes Valium daily as needed  . Bruises easily    d/t meds  . Cancer ([place])    basal cell ca, in situ- uterine   . Cataracts, bilateral   . Chronic back pain   . Dizziness    r/t to meds  . GERD (gastroesophageal reflux disease)    no meds on a regular basis but will take Tums if needed  . Headache(784.0)    r/t neck issues  . History of bronchitis 6-60yrs ago  . History of colon polyps    benign  . Hyperlipidemia    takes Atorvastatin on Mondays and Fridays  . Hypertension    takes Ramipril daily  . Joint pain   . Joint swelling   . Multiple sclerosis ([place])   . Neuromuscular disorder ([place])    Dr. Park Liter- [facility], follows M.S.  . Nocturia   . OA (osteoarthritis)    Dr Jani Gravel weekly, RA- hands- knees- feet   . PONV (postoperative nausea and vomiting)    trouble urinating after surgery in 2014  . Rheumatoid arthritis(714.0)    Dr Estanislado Pandy takes [name] daily  . Right wrist fracture   . Skin cancer   . Urinary retention    sees Dr.Wrenn about 2 times a yr    Past Surgical History:  Procedure Laterality Date  . ABDOMINAL HYSTERECTOMY     1985  . APPENDECTOMY     with TAH  . BACK SURGERY     several  . BREAST BIOPSY  Right    Results were negative  . BROW LIFT Bilateral 10/02/2016   Procedure: BILATERAL LOWER LID BLEPHAROPLASTY;  Surgeon: Clista Bernhardt, MD;  Location: [facility];  Service: Plastics;  Laterality: Bilateral;  . CARPAL TUNNEL RELEASE Right   . cataracts    . CERVICAL FUSION     Dr Joya Salm  . CERVICAL FUSION  12/31/2012   Dr Joya Salm  . CHEST TUBE INSERTION     for traumatic Pneumothorax  . COLONOSCOPY  07/16/2010   normal   . COLONOSCOPY W/ POLYPECTOMY  1997   negative since; Dr Deatra Ina  . ESOPHAGOGASTRODUODENOSCOPY  07/16/2010   normal  . eye lid raise    . EYE SURGERY Bilateral    cataracts removed - /w IOL  . JOINT REPLACEMENT    . LUMBAR FUSION     Dr Joya Salm  . NASAL SINUS SURGERY    . POSTERIOR CERVICAL FUSION/FORAMINOTOMY N/A 12/31/2012   Procedure: POSTERIOR LATERAL CERVICAL FUSION/FORAMINOTOMY LEVEL 1 CERVICAL THREE-FOUR WITH LATERAL MASS SCREWS;  Surgeon: Floyce Stakes, MD;  Location: [facility];  Service: Neurosurgery;  Laterality: N/A;  . PTOSIS REPAIR Bilateral 10/02/2016   Procedure: INTERNAL PTOSIS REPAIR;  Surgeon: Clista Bernhardt, MD;  Location: [facility];  Service: Plastics;  Laterality: Bilateral;  . SKIN BIOPSY    . THYROID SURGERY     R lobe removed- 1972, has grown back - CT last done- 2018  . TOTAL HIP ARTHROPLASTY  12/22/2011   Procedure: TOTAL HIP ARTHROPLASTY;  Surgeon: Kerin Salen, MD;  Location: [facility];  Service: Orthopedics;  Laterality: Left;  . TOTAL SHOULDER ARTHROPLASTY    . TUBAL LIGATION      There were no vitals filed for this visit.  Subjective Assessment - 12/22/18 1410    Subjective  Pt states decreased pain from DN last session.    Currently in Pain?  Yes    Pain Score  2     Pain Location  Hip    Pain Orientation  Left    Pain Descriptors / Indicators  Aching    Pain Type  Chronic pain    Pain Onset  More than a month ago    Pain Frequency  Intermittent                       [facility] Adult PT Treatment/Exercise -  12/22/18 1412      Ambulation/Gait   Gait Comments  35 ft x8, with speed changes, x2 and head turns x3; Bwd walking x2; Side stepping 25 ft x4;       Knee/Hip Exercises: Stretches   Active Hamstring Stretch  3 reps;30 seconds    Active Hamstring Stretch Limitations  seated      Knee/Hip Exercises: Aerobic   Stationary Bike  L1 x 8 min      Knee/Hip Exercises: Standing   Hip Abduction  Both;10 reps;2 sets    Lateral Step Up  10 reps;Both    Functional Squat  10 reps;2 sets    Functional Squat Limitations  with forward/floor reach x5;       Knee/Hip Exercises: Seated   Sit to Sand  10 reps      Knee/Hip Exercises: Supine   Bridges  20 reps    Bridges with [employer]  --    Other Supine Knee/Hip Exercises  --      Knee/Hip Exercises: Sidelying   Hip ABduction  10 reps;2 sets;Both      Manual Therapy   Manual Therapy  Soft tissue mobilization    Manual therapy comments  skilled palpation and monitoring of soft tissue during dry needling.     Soft tissue mobilization  --       Trigger Point Dry Needling - 12/22/18 0001    Consent Given?  Yes    Education Handout Provided  Previously provided    Muscles Treated Back/Hip  Gluteus medius;Piriformis;Obturator internus    Gluteus Medius Response  Twitch response elicited;Palpable increased muscle length    Piriformis Response  Twitch response elicited;Palpable increased muscle length    Obturator internus Response  Palpable increased muscle length             PT Short Term Goals - 12/16/18 1420      PT SHORT TERM GOAL #1   Title  Pt to be independent with initial HEP    Time  2    Period  Weeks    Status  Achieved    Target Date  12/08/18        PT Long  Term Goals - 11/25/18 1201      PT LONG TERM GOAL #1   Title  Pt to be independent for long term HEP of strengthening for hips.    Time  6    Period  Weeks    Status  New    Target Date  01/05/19      PT LONG TERM GOAL #2   Title  Pt with report  decreased pain to 0-2/10 with transfers, standing, and walking activity, to improve ability for IADLs.    Time  6    Period  Weeks    Status  New    Target Date  01/05/19      PT LONG TERM GOAL #3   Title  Pt to demo increased strength of L hip to br 5/5, to improve stability, gait, and pain.    Time  6    Period  Weeks    Status  New    Target Date  01/05/19      PT LONG TERM GOAL #4   Title  Pt to demo proper lifting/squat mechanics, to decrease risk for posterior hip instability.    Time  6    Period  Weeks    Status  New    Target Date  01/05/19            Plan - 12/22/18 1503    Clinical Impression Statement  Pt with good tolerance and god twitch response from dry needling in glutes today. Pt progressing with hip strengthening, but requires mod cuing for mechanics and posture with hip abd. Pt with improving ability and mechanics with squat, able to perform lower reach with squat than previous weeks. Pt progressing well.    Personal Factors and Comorbidities  Comorbidity 1;Comorbidity 2;Other    Comorbidities  L TKA, osteoporosis, multiple previous fx, MS, RA, R hip labral tear    Examination-Activity Limitations  Locomotion Level;Transfers;Bed Mobility;Sit;Squat;Stairs;Stand;Lift    Examination-Participation Restrictions  Meal Prep;Cleaning;Community Activity;Shop;Laundry;Yard Work    [employer] officer  Evolving/Moderate complexity    Rehab Potential  Good    PT Frequency  2x / week    PT Duration  6 weeks    PT Treatment/Interventions  ADLs/Self Care Home Management;Cryotherapy;Electrical Stimulation;Ultrasound;Traction;Moist Heat;Iontophoresis 4mg /ml Dexamethasone;Gait training;Stair training;Functional mobility training;Therapeutic activities;Therapeutic exercise;Orthotic Fit/Training;Patient/family education;Neuromuscular re-education;Manual techniques;Passive range of motion;Dry needling;Spinal Manipulations;Taping;Joint Manipulations    Consulted and  Agree with Plan of Care  Patient       Patient will benefit from skilled therapeutic intervention in order to improve the following deficits and impairments:  Abnormal gait, Difficulty walking, Increased muscle spasms, Decreased endurance, Decreased activity tolerance, Pain, Decreased balance, Impaired flexibility, Improper body mechanics, Decreased strength, Decreased mobility  Visit Diagnosis: 1. Pain in left hip   2. Other abnormalities of gait and mobility        Problem List Patient Active Problem List   Diagnosis Date Noted  . Right sided sciatica 04/14/2018  . Trochanteric bursitis of right hip 04/14/2018  . S/P cervical spinal fusion 11/10/2017  . Swelling of left foot 10/26/2017  . Dysphagia 10/26/2017  . Left ankle swelling 10/26/2017  . Localized swelling, mass, and lump of head 08/18/2017  . Gastroesophageal reflux disease 08/06/2017  . Easy bruising 08/06/2017  . Anxiety 04/24/2017  . Mid back pain 03/26/2017  . Left sided chest pain 03/25/2017  . Dyspnea on exertion 03/14/2017  . Hyperkalemia 03/13/2017  . Submandibular gland swelling 01/08/2017  . High risk medication use 09/02/2016  .  Urinary frequency 09/02/2016  . Piriformis muscle pain 09/02/2016  . Fatigue 07/22/2016  . Hair loss 07/22/2016  . Right hip pain 04/08/2016  . Right knee pain 02/25/2016  . Dyspepsia 04/05/2015  . Numbness 11/14/2014  . Biceps tendon tear 10/10/2014  . Insomnia 10/10/2014  . Gait disturbance 06/13/2014  . 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 OP (osteoporosis) 01/18/2014  . Other long term (current) drug therapy 01/18/2014  . Other dysphagia 12/29/2013  . Hoarseness 12/29/2013  . 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 Personal history of colonic polyps 07/03/2010  . Nocturia 05/28/2010  . THYROID NODULE 05/17/2008  . Multiple sclerosis ([place]) 05/17/2008  . Chronic fatigue 03/30/2007  . Hyperlipidemia 08/20/2006  . Essential hypertension 08/20/2006   . Rheumatoid arthritis ([place]) 08/20/2006  . CERVICAL CANCER, HX OF 08/20/2006  . GASTRIC ULCER, HX OF 08/20/2006    Lyndee Hensen, PT, DPT 3:06 PM  12/22/18    [facility] [place], [place], [address] Phone: [phone]   Fax:  [phone]  Name: Sherri [name] MRN: [id number] Date of Birth: Jul 17, 1940

## 2018-12-27 ENCOUNTER — Ambulatory Visit (INDEPENDENT_AMBULATORY_CARE_PROVIDER_SITE_OTHER): Payer: Medicare Other | Admitting: Physical Therapy

## 2018-12-27 ENCOUNTER — Other Ambulatory Visit: Payer: Self-pay

## 2018-12-27 ENCOUNTER — Ambulatory Visit: Payer: Medicare Other | Admitting: Physician Assistant

## 2018-12-27 ENCOUNTER — Encounter: Payer: Self-pay | Admitting: Physician Assistant

## 2018-12-27 ENCOUNTER — Encounter: Payer: Self-pay | Admitting: Physical Therapy

## 2018-12-27 VITALS — BP 101/56 | HR 63 | Resp 16 | Ht 62.0 in | Wt 118.6 lb

## 2018-12-27 DIAGNOSIS — Z79899 Other long term (current) drug therapy: Secondary | ICD-10-CM | POA: Diagnosis not present

## 2018-12-27 DIAGNOSIS — M4312 Spondylolisthesis, cervical region: Secondary | ICD-10-CM

## 2018-12-27 DIAGNOSIS — Z8669 Personal history of other diseases of the nervous system and sense organs: Secondary | ICD-10-CM

## 2018-12-27 DIAGNOSIS — S62101D Fracture of unspecified carpal bone, right wrist, subsequent encounter for fracture with routine healing: Secondary | ICD-10-CM | POA: Diagnosis not present

## 2018-12-27 DIAGNOSIS — M0579 Rheumatoid arthritis with rheumatoid factor of multiple sites without organ or systems involvement: Secondary | ICD-10-CM | POA: Diagnosis not present

## 2018-12-27 DIAGNOSIS — Z96642 Presence of left artificial hip joint: Secondary | ICD-10-CM

## 2018-12-27 DIAGNOSIS — R2689 Other abnormalities of gait and mobility: Secondary | ICD-10-CM | POA: Diagnosis not present

## 2018-12-27 DIAGNOSIS — M1611 Unilateral primary osteoarthritis, right hip: Secondary | ICD-10-CM | POA: Diagnosis not present

## 2018-12-27 DIAGNOSIS — Z8639 Personal history of other endocrine, nutritional and metabolic disease: Secondary | ICD-10-CM

## 2018-12-27 DIAGNOSIS — G35 Multiple sclerosis: Secondary | ICD-10-CM

## 2018-12-27 DIAGNOSIS — F5101 Primary insomnia: Secondary | ICD-10-CM

## 2018-12-27 DIAGNOSIS — M5136 Other intervertebral disc degeneration, lumbar region: Secondary | ICD-10-CM

## 2018-12-27 DIAGNOSIS — R5383 Other fatigue: Secondary | ICD-10-CM

## 2018-12-27 DIAGNOSIS — M7061 Trochanteric bursitis, right hip: Secondary | ICD-10-CM

## 2018-12-27 DIAGNOSIS — Z8659 Personal history of other mental and behavioral disorders: Secondary | ICD-10-CM

## 2018-12-27 DIAGNOSIS — M25552 Pain in left hip: Secondary | ICD-10-CM

## 2018-12-27 DIAGNOSIS — Z8679 Personal history of other diseases of the circulatory system: Secondary | ICD-10-CM

## 2018-12-27 DIAGNOSIS — M810 Age-related osteoporosis without current pathological fracture: Secondary | ICD-10-CM

## 2018-12-27 NOTE — Therapy (Signed)
[facility] [address] [place], [place], [address] Phone: [phone]   Fax:  [phone]  Physical Therapy Treatment  Patient Details  Name: Sherri [name] MRN: [id number] Date of Birth: 16-Mar-1941 Referring Provider (PT): Dr. Ninfa Linden   Encounter Date: 12/27/2018  PT End of Session - 12/27/18 1446    Visit Number  8    Number of Visits  12    Date for PT Re-Evaluation  01/05/19    Authorization Type  UHC    PT Start Time  6644    PT Stop Time  1426    PT Time Calculation (min)  44 min    Activity Tolerance  Patient tolerated treatment well    Behavior During Therapy  [facility] for tasks assessed/performed       Past Medical History:  Diagnosis Date  . Anxiety    takes Valium daily as needed  . Bruises easily    d/t meds  . Cancer ([place])    basal cell ca, in situ- uterine   . Cataracts, bilateral   . Chronic back pain   . Dizziness    r/t to meds  . GERD (gastroesophageal reflux disease)    no meds on a regular basis but will take Tums if needed  . Headache(784.0)    r/t neck issues  . History of bronchitis 6-43yrs ago  . History of colon polyps    benign  . Hyperlipidemia    takes Atorvastatin on Mondays and Fridays  . Hypertension    takes Ramipril daily  . Joint pain   . Joint swelling   . Multiple sclerosis ([place])   . Neuromuscular disorder ([place])    Dr. Park Liter- [facility], follows M.S.  . Nocturia   . OA (osteoarthritis)    Dr Jani Gravel weekly, RA- hands- knees- feet   . PONV (postoperative nausea and vomiting)    trouble urinating after surgery in 2014  . Rheumatoid arthritis(714.0)    Dr Estanislado Pandy takes [name] daily  . Right wrist fracture   . Skin cancer   . Urinary retention    sees Dr.Wrenn about 2 times a yr    Past Surgical History:  Procedure Laterality Date  . ABDOMINAL HYSTERECTOMY     1985  . APPENDECTOMY     with TAH  . BACK SURGERY     several  . BREAST BIOPSY  Right    Results were negative  . BROW LIFT Bilateral 10/02/2016   Procedure: BILATERAL LOWER LID BLEPHAROPLASTY;  Surgeon: Clista Bernhardt, MD;  Location: [facility];  Service: Plastics;  Laterality: Bilateral;  . CARPAL TUNNEL RELEASE Right   . cataracts    . CERVICAL FUSION     Dr Joya Salm  . CERVICAL FUSION  12/31/2012   Dr Joya Salm  . CHEST TUBE INSERTION     for traumatic Pneumothorax  . COLONOSCOPY  07/16/2010   normal   . COLONOSCOPY W/ POLYPECTOMY  1997   negative since; Dr Deatra Ina  . ESOPHAGOGASTRODUODENOSCOPY  07/16/2010   normal  . eye lid raise    . EYE SURGERY Bilateral    cataracts removed - /w IOL  . JOINT REPLACEMENT    . LUMBAR FUSION     Dr Joya Salm  . NASAL SINUS SURGERY    . POSTERIOR CERVICAL FUSION/FORAMINOTOMY N/A 12/31/2012   Procedure: POSTERIOR LATERAL CERVICAL FUSION/FORAMINOTOMY LEVEL 1 CERVICAL THREE-FOUR WITH LATERAL MASS SCREWS;  Surgeon: Floyce Stakes, MD;  Location: [facility];  Service: Neurosurgery;  Laterality: N/A;  . PTOSIS REPAIR Bilateral 10/02/2016   Procedure: INTERNAL PTOSIS REPAIR;  Surgeon: Clista Bernhardt, MD;  Location: [facility];  Service: Plastics;  Laterality: Bilateral;  . SKIN BIOPSY    . THYROID SURGERY     R lobe removed- 1972, has grown back - CT last done- 2018  . TOTAL HIP ARTHROPLASTY  12/22/2011   Procedure: TOTAL HIP ARTHROPLASTY;  Surgeon: Kerin Salen, MD;  Location: [facility];  Service: Orthopedics;  Laterality: Left;  . TOTAL SHOULDER ARTHROPLASTY    . TUBAL LIGATION      There were no vitals filed for this visit.  Subjective Assessment - 12/27/18 1445    Subjective  Pt states decreased pain in hip, she states "my piriformis is always sore" but pain has improved. She feels hip is moving better and has had no instances of it "popping" She has follow up with MD next week.    Currently in Pain?  Yes    Pain Score  2     Pain Location  Hip    Pain Orientation  Left    Pain Descriptors / Indicators  Aching;Tightness    Pain  Type  Chronic pain    Pain Onset  More than a month ago    Pain Frequency  Intermittent                       [facility] Adult PT Treatment/Exercise - 12/27/18 1353      Ambulation/Gait   Gait Comments  Bwd walking 25 ft x4 ; Side stepping  x4;       Knee/Hip Exercises: Stretches   Active Hamstring Stretch  3 reps;30 seconds    Active Hamstring Stretch Limitations  seated    Piriformis Stretch  2 reps;30 seconds    Piriformis Stretch Limitations  seated fig 4      Knee/Hip Exercises: Aerobic   Stationary Bike  L1 x 9 min      Knee/Hip Exercises: Standing   Hip Abduction  Both;10 reps;2 sets    Lateral Step Up  --    Functional Squat  10 reps;2 sets    Functional Squat Limitations  --      Knee/Hip Exercises: Seated   Sit to Sand  10 reps      Knee/Hip Exercises: Supine   Bridges  20 reps    Other Supine Knee/Hip Exercises  Clam GTB x20 alternating;        Knee/Hip Exercises: Sidelying   Hip ABduction  10 reps;2 sets;Both      Manual Therapy   Manual Therapy  --    Manual therapy comments  --               PT Short Term Goals - 12/16/18 1420      PT SHORT TERM GOAL #1   Title  Pt to be independent with initial HEP    Time  2    Period  Weeks    Status  Achieved    Target Date  12/08/18        PT Long Term Goals - 11/25/18 1201      PT LONG TERM GOAL #1   Title  Pt to be independent for long term HEP of strengthening for hips.    Time  6    Period  Weeks    Status  New    Target Date  01/05/19  PT LONG TERM GOAL #2   Title  Pt with report decreased pain to 0-2/10 with transfers, standing, and walking activity, to improve ability for IADLs.    Time  6    Period  Weeks    Status  New    Target Date  01/05/19      PT LONG TERM GOAL #3   Title  Pt to demo increased strength of L hip to br 5/5, to improve stability, gait, and pain.    Time  6    Period  Weeks    Status  New    Target Date  01/05/19      PT LONG TERM GOAL #4    Title  Pt to demo proper lifting/squat mechanics, to decrease risk for posterior hip instability.    Time  6    Period  Weeks    Status  New    Target Date  01/05/19            Plan - 12/27/18 1448    Clinical Impression Statement  Pt progressing well. Plan to see pt for one more visit, will finalize HEP, then will d/c to HEP. Pt with improving strength. She requires mod cuing with ther ex for keeping hips and knees in neutral alignment and not allowing knee valgus.    Personal Factors and Comorbidities  Comorbidity 1;Comorbidity 2;Other    Comorbidities  L TKA, osteoporosis, multiple previous fx, MS, RA, R hip labral tear    Examination-Activity Limitations  Locomotion Level;Transfers;Bed Mobility;Sit;Squat;Stairs;Stand;Lift    Examination-Participation Restrictions  Meal Prep;Cleaning;Community Activity;Shop;Laundry;Yard Work    [employer] officer  Evolving/Moderate complexity    Rehab Potential  Good    PT Frequency  2x / week    PT Duration  6 weeks    PT Treatment/Interventions  ADLs/Self Care Home Management;Cryotherapy;Electrical Stimulation;Ultrasound;Traction;Moist Heat;Iontophoresis 4mg /ml Dexamethasone;Gait training;Stair training;Functional mobility training;Therapeutic activities;Therapeutic exercise;Orthotic Fit/Training;Patient/family education;Neuromuscular re-education;Manual techniques;Passive range of motion;Dry needling;Spinal Manipulations;Taping;Joint Manipulations    Consulted and Agree with Plan of Care  Patient       Patient will benefit from skilled therapeutic intervention in order to improve the following deficits and impairments:  Abnormal gait, Difficulty walking, Increased muscle spasms, Decreased endurance, Decreased activity tolerance, Pain, Decreased balance, Impaired flexibility, Improper body mechanics, Decreased strength, Decreased mobility  Visit Diagnosis: 1. Pain in left hip   2. Other abnormalities of gait and mobility         Problem List Patient Active Problem List   Diagnosis Date Noted  . Right sided sciatica 04/14/2018  . Trochanteric bursitis of right hip 04/14/2018  . S/P cervical spinal fusion 11/10/2017  . Swelling of left foot 10/26/2017  . Dysphagia 10/26/2017  . Left ankle swelling 10/26/2017  . Localized swelling, mass, and lump of head 08/18/2017  . Gastroesophageal reflux disease 08/06/2017  . Easy bruising 08/06/2017  . Anxiety 04/24/2017  . Mid back pain 03/26/2017  . Left sided chest pain 03/25/2017  . Dyspnea on exertion 03/14/2017  . Hyperkalemia 03/13/2017  . Submandibular gland swelling 01/08/2017  . High risk medication use 09/02/2016  . Urinary frequency 09/02/2016  . Piriformis muscle pain 09/02/2016  . Fatigue 07/22/2016  . Hair loss 07/22/2016  . Right hip pain 04/08/2016  . Right knee pain 02/25/2016  . Dyspepsia 04/05/2015  . Numbness 11/14/2014  . Biceps tendon tear 10/10/2014  . Insomnia 10/10/2014  . Gait disturbance 06/13/2014  . 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 OP (osteoporosis) 01/18/2014  . Other long  term (current) drug therapy 01/18/2014  . Other dysphagia 12/29/2013  . Hoarseness 12/29/2013  . 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 Personal history of colonic polyps 07/03/2010  . Nocturia 05/28/2010  . THYROID NODULE 05/17/2008  . Multiple sclerosis ([place]) 05/17/2008  . Chronic fatigue 03/30/2007  . Hyperlipidemia 08/20/2006  . Essential hypertension 08/20/2006  . Rheumatoid arthritis ([place]) 08/20/2006  . CERVICAL CANCER, HX OF 08/20/2006  . GASTRIC ULCER, HX OF 08/20/2006   Lyndee Hensen, PT, DPT 2:51 PM  12/27/18    [facility] [place], [place], [address] Phone: [phone]   Fax:  [phone]  Name: Sherri [name] MRN: [id number] Date of Birth: Jan 26, 1941

## 2018-12-27 NOTE — Patient Instructions (Signed)
Standing Labs We placed an order today for your standing lab work.    Please come back and get your standing labs in September and every 3 months  We have open lab daily Monday through Thursday from 8:30-12:30 PM and 1:30-4:30 PM and Friday from 8:30-12:30 PM and 1:30 -4:00 PM at the office of Dr. Shaili Deveshwar.   You may experience shorter wait times on Monday and Friday afternoons. The office is located at [address], [address], [place], [place] [address] No appointment is necessary.   Labs are drawn by [facility].  You may receive a bill from [facility] for your lab work.  If you wish to have your labs drawn at another location, please call the office 24 hours in advance to send orders.  If you have any questions regarding directions or hours of operation,  please call [phone].   Just as a reminder please drink plenty of water prior to coming for your lab work. Thanks!  

## 2018-12-29 DIAGNOSIS — L570 Actinic keratosis: Secondary | ICD-10-CM | POA: Diagnosis not present

## 2018-12-30 ENCOUNTER — Encounter: Payer: Self-pay | Admitting: Physical Therapy

## 2018-12-30 ENCOUNTER — Encounter: Payer: Self-pay | Admitting: Internal Medicine

## 2018-12-30 ENCOUNTER — Ambulatory Visit (INDEPENDENT_AMBULATORY_CARE_PROVIDER_SITE_OTHER): Payer: Medicare Other | Admitting: Physical Therapy

## 2018-12-30 DIAGNOSIS — M25552 Pain in left hip: Secondary | ICD-10-CM | POA: Diagnosis not present

## 2018-12-30 DIAGNOSIS — R2689 Other abnormalities of gait and mobility: Secondary | ICD-10-CM

## 2018-12-30 NOTE — Therapy (Signed)
[facility] [address] [place], [place], [address] Phone: [phone]   Fax:  [phone]  Physical Therapy Treatment/Discharge  Patient Details  Name: Sherri [name] MRN: [id number] Date of Birth: [birth year] Referring Provider (PT): Dr. Ninfa Linden   Encounter Date: 12/30/2018  PT End of Session - 12/30/18 1420    Visit Number  9    Number of Visits  12    Date for PT Re-Evaluation  01/05/19    Authorization Type  [employer]    PT Start Time  6789    PT Stop Time  1425    PT Time Calculation (min)  40 min    Activity Tolerance  Patient tolerated treatment well    Behavior During Therapy  [facility] for tasks assessed/performed       Past Medical History:  Diagnosis Date  . Anxiety    takes Valium daily as needed  . Bruises easily    d/t meds  . Cancer ([place])    basal cell ca, in situ- uterine   . Cataracts, bilateral   . Chronic back pain   . Dizziness    r/t to meds  . GERD (gastroesophageal reflux disease)    no meds on a regular basis but will take Tums if needed  . Headache(784.0)    r/t neck issues  . History of bronchitis 6-35yr ago  . History of colon polyps    benign  . Hyperlipidemia    takes Atorvastatin on Mondays and Fridays  . Hypertension    takes Ramipril daily  . Joint pain   . Joint swelling   . [place]   . [place]romuscular disorder ([place]    Dr. RPark Liter [facility], follows [facility]  . Nocturia   . OA (osteoarthritis)    Dr VJani Gravelweekly, RA- hands- knees- feet   . PONV (postoperative nausea and vomiting)    trouble urinating after surgery in 2014  . Rheumatoid arthritis(714.0)    Dr DEstanislado Pandytakes XMorrie Sheldondaily  . Right wrist fracture   . Skin cancer   . Urinary retention    sees Dr.Wrenn about 2 times a yr    Past Surgical History:  Procedure Laterality Date  . ABDOMINAL HYSTERECTOMY     1985  . APPENDECTOMY     with TAH  . BACK SURGERY     several  . BREAST  BIOPSY Right    Results were negative  . BROW LIFT Bilateral 10/02/2016   Procedure: BILATERAL LOWER LID BLEPHAROPLASTY;  Surgeon: AClista Bernhardt MD;  [facility]  Service: Plastics;  Laterality: Bilateral;  . CARPAL TUNNEL RELEASE Right   . cataracts    . CERVICAL FUSION     Dr BJoya Salm . CERVICAL FUSION  12/31/2012   Dr BJoya Salm . CHEST TUBE INSERTION     for traumatic Pneumothorax  . COLONOSCOPY  07/16/2010   normal   . COLONOSCOPY W/ POLYPECTOMY  1997   negative since; Dr KDeatra Ina . ESOPHAGOGASTRODUODENOSCOPY  07/16/2010   normal  . eye lid raise    . EYE SURGERY Bilateral    cataracts removed - /w IOL  . JOINT REPLACEMENT    . LUMBAR FUSION     Dr BJoya Salm . NASAL SINUS SURGERY    . POSTERIOR CERVICAL FUSION/FORAMINOTOMY N/A 12/31/2012   Procedure: POSTERIOR LATERAL CERVICAL FUSION/FORAMINOTOMY LEVEL 1 CERVICAL THREE-FOUR WITH LATERAL MASS SCREWS;  Surgeon: EFloyce Stakes MD;  Location: [facility];  Service: Neurosurgery;  Laterality: N/A;  . PTOSIS REPAIR Bilateral 10/02/2016   Procedure: INTERNAL PTOSIS REPAIR;  Surgeon: Clista Bernhardt, MD;  Location: [facility];  Service: Plastics;  Laterality: Bilateral;  . SKIN BIOPSY    . THYROID SURGERY     R lobe removed- 1972, has grown back - CT last done- 2018  . TOTAL HIP ARTHROPLASTY  12/22/2011   Procedure: TOTAL HIP ARTHROPLASTY;  Surgeon: Kerin Salen, MD;  Location: [facility];  Service: Orthopedics;  Laterality: Left;  . TOTAL SHOULDER ARTHROPLASTY    . TUBAL LIGATION      There were no vitals filed for this visit.  Subjective Assessment - 12/30/18 1419    Subjective  Pt states that she is doing very well. She has been doing HEP. She says she "always has pain in piriformis" but deep hip pain is better.    Currently in Pain?  Yes    Pain Score  2     Pain Location  Hip    Pain Orientation  Left    Pain Descriptors / Indicators  Aching;Tightness    Pain Type  Chronic pain    Pain Onset  More than a month ago     Pain Frequency  Intermittent                       [facility] Adult PT Treatment/Exercise - 12/30/18 1358      Ambulation/Gait   Gait Comments  Bwd walking 25 ft x4 ; Side stepping  x4;       Knee/Hip Exercises: Stretches   Active Hamstring Stretch  3 reps;30 seconds    Active Hamstring Stretch Limitations  seated    Piriformis Stretch  2 reps;30 seconds    Piriformis Stretch Limitations  supine fig 4      Knee/Hip Exercises: Aerobic   Stationary Bike  L1 x 8 min      Knee/Hip Exercises: Standing   Hip Abduction  Both;10 reps;2 sets    Functional Squat  10 reps;2 sets      Knee/Hip Exercises: Seated   Sit to Sand  10 reps      Knee/Hip Exercises: Supine   Bridges  --    Bridges with [employer]  20 reps    Straight Leg Raises  20 reps;Both    Other Supine Knee/Hip Exercises  Clam GTB x20 alternating;        Knee/Hip Exercises: Sidelying   Hip ABduction  10 reps;2 sets;Both             PT Education - 12/30/18 1359    Education Details  Final HEP reviewed    Person(s) Educated  Patient    Methods  Explanation;Handout    Comprehension  Verbalized understanding       PT Short Term Goals - 12/16/18 1420      PT SHORT TERM GOAL #1   Title  Pt to be independent with initial HEP    Time  2    Period  Weeks    Status  Achieved    Target Date  12/08/18        PT Long Term Goals - 12/30/18 1422      PT LONG TERM GOAL #1   Title  Pt to be independent for long term HEP of strengthening for hips.    Time  6    Period  Weeks    Status  Achieved  PT LONG TERM GOAL #2   Title  Pt with report decreased pain to 0-2/10 with transfers, standing, and walking activity, to improve ability for IADLs.    Time  6    Period  Weeks    Status  Achieved      PT LONG TERM GOAL #3   Title  Pt to demo increased strength of L hip to br 5/5, to improve stability, gait, and pain.    Time  6    Period  Weeks    Status  Achieved      PT LONG TERM GOAL #4    Title  Pt to demo proper lifting/squat mechanics, to decrease risk for posterior hip instability.    Time  6    Period  Weeks    Status  Achieved            Plan - 12/30/18 1438    Clinical Impression Statement  Pt has met all goals. She has improved strength in Bil hips, and has had no other instances of hip giving out. Pt ready for d/c to HEP. Final HEP reviewed today, pt with good understanding.    Personal Factors and Comorbidities  Comorbidity 1;Comorbidity 2;Other    Comorbidities  L TKA, osteoporosis, multiple previous fx, MS, RA, R hip labral tear    Examination-Activity Limitations  Locomotion Level;Transfers;Bed Mobility;Sit;Squat;Stairs;Stand;Lift    Examination-Participation Restrictions  Meal Prep;Cleaning;Community Activity;Shop;Laundry;Yard Work    [employer]  Evolving/Moderate complexity    Rehab Potential  Good    PT Frequency  2x / week    PT Duration  6 weeks    PT Treatment/Interventions  ADLs/Self Care Home Management;Cryotherapy;Electrical Stimulation;Ultrasound;Traction;Moist Heat;Iontophoresis 41m/ml Dexamethasone;Gait training;Stair training;Functional mobility training;Therapeutic activities;Therapeutic exercise;Orthotic Fit/Training;Patient/family education;Neuromuscular re-education;Manual techniques;Passive range of motion;Dry needling;Spinal Manipulations;Taping;Joint Manipulations    Consulted and Agree with Plan of Care  Patient       Patient will benefit from skilled therapeutic intervention in order to improve the following deficits and impairments:  Abnormal gait, Difficulty walking, Increased muscle spasms, Decreased endurance, Decreased activity tolerance, Pain, Decreased balance, Impaired flexibility, Improper body mechanics, Decreased strength, Decreased mobility  Visit Diagnosis: 1. Pain in left hip   2. Other abnormalities of gait and mobility        Problem List Patient Active Problem List   Diagnosis Date Noted   . Right sided sciatica 04/14/2018  . Trochanteric bursitis of right hip 04/14/2018  . S/P cervical spinal fusion 11/10/2017  . Swelling of left foot 10/26/2017  . Dysphagia 10/26/2017  . Left ankle swelling 10/26/2017  . Localized swelling, mass, and lump of head 08/18/2017  . Gastroesophageal reflux disease 08/06/2017  . Easy bruising 08/06/2017  . Anxiety 04/24/2017  . Mid back pain 03/26/2017  . Left sided chest pain 03/25/2017  . Dyspnea on exertion 03/14/2017  . Hyperkalemia 03/13/2017  . Submandibular gland swelling 01/08/2017  . High risk medication use 09/02/2016  . Urinary frequency 09/02/2016  . Piriformis muscle pain 09/02/2016  . Fatigue 07/22/2016  . Hair loss 07/22/2016  . Right hip pain 04/08/2016  . Right knee pain 02/25/2016  . Dyspepsia 04/05/2015  . Numbness 11/14/2014  . Biceps tendon tear 10/10/2014  . Insomnia 10/10/2014  . Gait disturbance 06/13/2014  . 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 OP (osteoporosis) 01/18/2014  . Other long term (current) drug therapy 01/18/2014  . Other dysphagia 12/29/2013  . Hoarseness 12/29/2013  . 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 Personal history of  colonic polyps 07/03/2010  . Nocturia 05/28/2010  . THYROID NODULE 05/17/2008  . Multiple sclerosis ([place]) 05/17/2008  . Chronic fatigue 03/30/2007  . Hyperlipidemia 08/20/2006  . Essential hypertension 08/20/2006  . Rheumatoid arthritis ([place]) 08/20/2006  . CERVICAL CANCER, HX OF 08/20/2006  . GASTRIC ULCER, HX OF 08/20/2006    Lyndee Hensen, PT, DPT 2:40 PM  12/30/18    [facility] [place], [place], [address] Phone: [phone]   Fax:  [phone]  Name: CHANTAY WHITELOCK MRN: [id number] Date of Birth: [birth year]   PHYSICAL THERAPY DISCHARGE SUMMARY  Visits from Start of Care: 9   Plan: Patient agrees to discharge.  Patient goals were met. Patient is being discharged due  to meeting the stated rehab goals.  ?????     Lyndee Hensen, PT, DPT 2:40 PM  12/30/18

## 2018-12-30 NOTE — Patient Instructions (Signed)
Access Code: [email or web address]  URL: [email or web address]  Date: 12/15/2018  Prepared by: Lyndee Hensen   Exercises Supine March - 10 reps - 2 sets - 1x daily Supine Bridge - 10 reps - 2 sets - 1x daily Clamshell - 10 reps - 2 sets - 1x daily Supine Hip Adduction Isometric with Ball - 10 reps - 2 sets - hold - 1x daily Hooklying Clamshell with Resistance - 10 reps - 2 sets - 1x daily Standing Hip Abduction - 10 reps - 2 sets - 1x daily Standing Hip Extension - 10 reps - 2 sets - 1x daily Sidelying Hip Abduction - 10 reps - 2 sets - 1x daily Sideways Walking

## 2018-12-31 ENCOUNTER — Other Ambulatory Visit: Payer: Self-pay | Admitting: Rheumatology

## 2018-12-31 DIAGNOSIS — M0579 Rheumatoid arthritis with rheumatoid factor of multiple sites without organ or systems involvement: Secondary | ICD-10-CM

## 2018-12-31 NOTE — Telephone Encounter (Signed)
Last Visit: 12/27/18 Next Visit: 06/01/19 Labs: 11/03/18 Stable  Okay to refill per Dr. Estanislado Pandy

## 2019-01-03 ENCOUNTER — Other Ambulatory Visit: Payer: Self-pay

## 2019-01-03 ENCOUNTER — Ambulatory Visit (INDEPENDENT_AMBULATORY_CARE_PROVIDER_SITE_OTHER): Payer: Medicare Other | Admitting: Orthopaedic Surgery

## 2019-01-03 ENCOUNTER — Encounter: Payer: Self-pay | Admitting: Orthopaedic Surgery

## 2019-01-03 DIAGNOSIS — Z96642 Presence of left artificial hip joint: Secondary | ICD-10-CM | POA: Diagnosis not present

## 2019-01-03 DIAGNOSIS — M25552 Pain in left hip: Secondary | ICD-10-CM | POA: Diagnosis not present

## 2019-01-03 NOTE — Progress Notes (Signed)
The patient returns for follow-up after having physical therapy on her left hip and low back area.  She had had a hip replacement by 1 of my colleagues in town and felt like it had been coming out of place even though there was no documented evidence that it ever dislocated.  She had had a recent mechanical fall and landed hard.  His CT scan did show sclerotic changes in the sacrum and rami suggesting a fracture.  Since she is been going to physical therapy she says the sensation that her hip was coming out of place is gone and she feels much better overall and is very pleased.  On exam I can easily put her left hip the range of motion.  There is a small amount of pain of the trochanteric area but otherwise her exam is normal.  She does not walk with any limp.  She is got good strength in her left lower extremity.  This point follow-up can be as needed since she is doing well.  All question concerns were answered and addressed.

## 2019-01-06 MED FILL — OLUMIANT 2 MG TABS: 2 | 30 days supply | Qty: 30 | Fill #0

## 2019-01-11 DIAGNOSIS — R112 Nausea with vomiting, unspecified: Secondary | ICD-10-CM

## 2019-01-11 DIAGNOSIS — Z9889 Other specified postprocedural states: Secondary | ICD-10-CM

## 2019-01-11 DIAGNOSIS — H269 Unspecified cataract: Secondary | ICD-10-CM | POA: Insufficient documentation

## 2019-01-11 DIAGNOSIS — R42 Dizziness and giddiness: Secondary | ICD-10-CM | POA: Insufficient documentation

## 2019-01-11 HISTORY — DX: Other specified postprocedural states: R11.2

## 2019-01-11 HISTORY — DX: Other specified postprocedural states: Z98.890

## 2019-01-12 DIAGNOSIS — R3914 Feeling of incomplete bladder emptying: Secondary | ICD-10-CM | POA: Diagnosis not present

## 2019-01-12 DIAGNOSIS — N3010 Interstitial cystitis (chronic) without hematuria: Secondary | ICD-10-CM | POA: Diagnosis not present

## 2019-01-13 DIAGNOSIS — Z6821 Body mass index (BMI) 21.0-21.9, adult: Secondary | ICD-10-CM | POA: Diagnosis not present

## 2019-01-13 DIAGNOSIS — Z1231 Encounter for screening mammogram for malignant neoplasm of breast: Secondary | ICD-10-CM | POA: Diagnosis not present

## 2019-01-14 DIAGNOSIS — D440 Neoplasm of uncertain behavior of thyroid gland: Secondary | ICD-10-CM | POA: Diagnosis not present

## 2019-01-16 ENCOUNTER — Encounter: Payer: Self-pay | Admitting: Internal Medicine

## 2019-01-18 ENCOUNTER — Encounter: Payer: Self-pay | Admitting: Internal Medicine

## 2019-01-21 ENCOUNTER — Encounter: Payer: Self-pay | Admitting: Internal Medicine

## 2019-01-26 ENCOUNTER — Other Ambulatory Visit: Payer: Self-pay | Admitting: Internal Medicine

## 2019-01-30 ENCOUNTER — Other Ambulatory Visit: Payer: Self-pay | Admitting: Internal Medicine

## 2019-02-02 ENCOUNTER — Telehealth: Payer: Self-pay | Admitting: Neurology

## 2019-02-02 NOTE — Telephone Encounter (Addendum)
Pt messaged PCP who recommended she NOT get new shingles vaccine d/t being on immunosuppressant prescribed by Rheumatology. PCP wanted her to f/u with Rheumatology on recommendation. She called Rheumatology and they recommended she get vaccination. Pharmacist is also recommending vaccination.  Wants to know Dr. Garth Bigness recommendation and would prefer to wait until Dr. Felecia Shelling gets back in the office next week to see if he recommends getting vaccine. Advised I will send him a message and call back next week to let her know.

## 2019-02-02 NOTE — Telephone Encounter (Signed)
Pt called wanting to know the MD's opinion on her taking the new shingles drug because she is being told two different opinions from her other providers and she would like to know what is best for her. Please advise.

## 2019-02-03 MED FILL — OLUMIANT 2 MG TABS: 2 | 30 days supply | Qty: 30 | Fill #1

## 2019-02-05 NOTE — Telephone Encounter (Signed)
I also recommend that she get the vaccination

## 2019-02-07 NOTE — Telephone Encounter (Signed)
Called pt and relayed that Dr. Felecia Shelling also recommends she get vaccination. She verbalized understanding and appreciation for call.

## 2019-02-08 ENCOUNTER — Telehealth: Payer: Self-pay | Admitting: Rheumatology

## 2019-02-08 NOTE — Telephone Encounter (Signed)
Patient called stating she is due to have her Shingles injection and is requesting a return call to let her know if she should go to her primary care or if it is safe to go to [facility].

## 2019-02-08 NOTE — Telephone Encounter (Signed)
Patient advised she may go to either her PCP or the pharmacy to get the vaccine. Patient advised to get the shingrix vaccine.

## 2019-02-08 NOTE — Telephone Encounter (Signed)
Pt called again and states that she has another question for you about this vaccination.

## 2019-02-08 NOTE — Telephone Encounter (Signed)
Called pt back. Dr. Estanislado Pandy wants her to have the shingrix (deadened virus) . I had her spell back vaccine and she spelled correctly. Recommended she call her local [facility]. She should be able to get there.

## 2019-02-17 ENCOUNTER — Other Ambulatory Visit: Payer: Self-pay

## 2019-02-17 ENCOUNTER — Telehealth: Payer: Self-pay | Admitting: Rheumatology

## 2019-02-17 DIAGNOSIS — Z79899 Other long term (current) drug therapy: Secondary | ICD-10-CM | POA: Diagnosis not present

## 2019-02-17 LAB — COMPLETE METABOLIC PANEL WITH GFR
AG Ratio: 1.9 (calc) (ref 1.0–2.5)
ALT: 14 U/L (ref 6–29)
AST: 25 U/L (ref 10–35)
Albumin: 4.1 g/dL (ref 3.6–5.1)
Alkaline phosphatase (APISO): 41 U/L (ref 37–153)
BUN: 10 mg/dL (ref 7–25)
CO2: 29 mmol/L (ref 20–32)
Calcium: 9.6 mg/dL (ref 8.6–10.4)
Chloride: 101 mmol/L (ref 98–110)
Creat: 0.86 mg/dL (ref 0.60–0.93)
GFR, Est African American: 76 mL/min/{1.73_m2} (ref >=60)
GFR, Est Non African American: 66 mL/min/{1.73_m2} (ref >=60)
Globulin: 2.2 g/dL (calc) (ref 1.9–3.7)
Glucose, Bld: 90 mg/dL (ref 65–99)
Potassium: 4.9 mmol/L (ref 3.5–5.3)
Sodium: 140 mmol/L (ref 135–146)
Total Bilirubin: 0.4 mg/dL (ref 0.2–1.2)
Total Protein: 6.3 g/dL (ref 6.1–8.1)

## 2019-02-17 LAB — CBC WITH DIFFERENTIAL/PLATELET
Absolute Monocytes: 413 cells/uL (ref 200–950)
Basophils Absolute: 12 cells/uL (ref 0–200)
Basophils Relative: 0.3 %
Eosinophils Absolute: 12 cells/uL — ABNORMAL LOW (ref 15–500)
Eosinophils Relative: 0.3 %
HCT: 34.3 % — ABNORMAL LOW (ref 35.0–45.0)
Hemoglobin: 12 g/dL (ref 11.7–15.5)
Lymphs Abs: 698 cells/uL — ABNORMAL LOW (ref 850–3900)
MCH: 34.8 pg — ABNORMAL HIGH (ref 27.0–33.0)
MCHC: 35 g/dL (ref 32.0–36.0)
MCV: 99.4 fL (ref 80.0–100.0)
MPV: 9.5 fL (ref 7.5–12.5)
Monocytes Relative: 10.6 %
Neutro Abs: 2765 cells/uL (ref 1500–7800)
Neutrophils Relative %: 70.9 %
Platelets: 346 10*3/uL (ref 140–400)
RBC: 3.45 10*6/uL — ABNORMAL LOW (ref 3.80–5.10)
RDW: 13.6 % (ref 11.0–15.0)
Total Lymphocyte: 17.9 %
WBC: 3.9 10*3/uL (ref 3.8–10.8)

## 2019-02-17 NOTE — Telephone Encounter (Signed)
FYI: Patient received first Shingrex injection on 02/10/2019, and will get second half in 2 months.

## 2019-02-17 NOTE — Telephone Encounter (Signed)
Noted  

## 2019-02-19 ENCOUNTER — Encounter: Payer: Self-pay | Admitting: Orthopaedic Surgery

## 2019-02-21 ENCOUNTER — Other Ambulatory Visit: Payer: Self-pay | Admitting: Orthopaedic Surgery

## 2019-02-21 MED ORDER — CLINDAMYCIN HCL 150 MG PO CAPS: 300.0000 mg | ORAL_CAPSULE | Freq: Three times a day (TID) | ORAL | 1 refills | Status: DC

## 2019-03-10 MED FILL — OLUMIANT 2 MG TABS: 2 | 30 days supply | Qty: 30 | Fill #2

## 2019-03-11 DIAGNOSIS — R221 Localized swelling, mass and lump, neck: Secondary | ICD-10-CM | POA: Diagnosis not present

## 2019-03-11 DIAGNOSIS — G501 Atypical facial pain: Secondary | ICD-10-CM | POA: Diagnosis not present

## 2019-03-18 ENCOUNTER — Other Ambulatory Visit: Payer: Self-pay | Admitting: Rheumatology

## 2019-03-18 NOTE — Telephone Encounter (Signed)
Last Visit: 12/27/18 Next Visit: 06/01/19 Labs: 02/17/19 CMP WNL. RBC count is low but stable. We will continue to monitor.  Okay to refill per Dr. Estanislado Pandy

## 2019-03-21 ENCOUNTER — Encounter (INDEPENDENT_AMBULATORY_CARE_PROVIDER_SITE_OTHER): Payer: Self-pay

## 2019-03-24 ENCOUNTER — Other Ambulatory Visit: Payer: Self-pay | Admitting: Internal Medicine

## 2019-03-24 NOTE — Progress Notes (Signed)
Subjective:    Patient ID: Sherri [name], female    DOB: Nov 28, 1940, 78 y.o.   MRN: [id number]  HPI The patient is here for follow up.   Pain on left side of face back.  She has been experiencing swelling the entire left cheek and tenderness.  Initially she was having pain, but that has improved.  She first saw her dentist who thought she may need a root canal and sent her to a specialist.  He did not think this was a tooth problem and she was referred to ENT.  She saw her ENT and he put her on clindamycin for 2 weeks and the pain and swelling improved, but she still has swelling in the left cheek near her mouth and the whole left cheek is sore.  She wonders if this is related to the filler she had placed when she had her eye surgery or if there is another problem.  Hypertension: She is taking her medication daily. She is compliant with a low sodium diet.  She denies chest pain, palpitations, edema, shortness of breath and regular headaches.  Hyperlipidemia: She is taking her medication daily. She is compliant with a low fat/cholesterol diet. She denies myalgias.   GERD: She was experiencing hoarseness and was diagnosed with GERD, but denies true GERD symptoms.  She is taking her medication daily as prescribed.  She denies any GERD symptoms/hoarseness and feels her GERD is well controlled.   RA;  She is following with rheumatology.    MS:  She is following with neurology.    She takes 1-2 tramadol a day, which helps with her RA and multiple sclerosis pain.    Medications and allergies reviewed with patient and updated if appropriate.  Patient Active Problem List   Diagnosis Date Noted  . Right sided sciatica 04/14/2018  . Trochanteric bursitis of right hip 04/14/2018  . S/P cervical spinal fusion 11/10/2017  . Swelling of left foot 10/26/2017  . Dysphagia 10/26/2017  . Left ankle swelling 10/26/2017  . Localized swelling, mass, and lump of head 08/18/2017  . Gastroesophageal  reflux disease 08/06/2017  . Easy bruising 08/06/2017  . Anxiety 04/24/2017  . Mid back pain 03/26/2017  . Left sided chest pain 03/25/2017  . Dyspnea on exertion 03/14/2017  . Hyperkalemia 03/13/2017  . Submandibular gland swelling 01/08/2017  . High risk medication use 09/02/2016  . Urinary frequency 09/02/2016  . Piriformis muscle pain 09/02/2016  . Fatigue 07/22/2016  . Hair loss 07/22/2016  . Right hip pain 04/08/2016  . Right knee pain 02/25/2016  . Dyspepsia 04/05/2015  . Numbness 11/14/2014  . Biceps tendon tear 10/10/2014  . Insomnia 10/10/2014  . Gait disturbance 06/13/2014  . 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 OP (osteoporosis) 01/18/2014  . Other long term (current) drug therapy 01/18/2014  . Other dysphagia 12/29/2013  . Hoarseness 12/29/2013  . 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 Personal history of colonic polyps 07/03/2010  . Nocturia 05/28/2010  . THYROID NODULE 05/17/2008  . Multiple sclerosis ([place]) 05/17/2008  . Chronic fatigue 03/30/2007  . Hyperlipidemia 08/20/2006  . Essential hypertension 08/20/2006  . Rheumatoid arthritis ([place]) 08/20/2006  . CERVICAL CANCER, HX OF 08/20/2006  . GASTRIC ULCER, HX OF 08/20/2006    Current Outpatient Medications on File Prior to Visit  Medication Sig Dispense Refill  . atorvastatin (LIPITOR) 10 MG tablet TAKE 1 TABLET BY MOUTH ON MONDAYS AND FRIDAY -- OFFICE VISIT NEEDED FOR FURTHER REFILLS 27  tablet 0  . Biotin 5000 MCG TABS Take 5,000 mcg by mouth daily.     . Calcium Carbonate-Vitamin D (CALCIUM + D PO) Take 1 tablet by mouth daily.     . Carboxymethylcellul-Glycerin (LUBRICATING EYE DROPS OP) Apply 1 drop to eye daily as needed (dry eyes).    . carvedilol (COREG) 6.25 MG tablet TAKE 1 TABLET (6.25 MG TOTAL) BY MOUTH 2 (TWO) TIMES DAILY WITH A MEAL. 60 tablet 0  . Cholecalciferol (VITAMIN D3) 2000 units capsule Take 2,000 Units by mouth daily.    . Cyanocobalamin (VITAMIN B-12 PO) Take 1  tablet by mouth daily.    . diazepam (VALIUM) 5 MG tablet Take 1 tablet (5 mg total) by mouth at bedtime. 30 tablet 5  . diclofenac sodium (VOLTAREN) 1 % GEL APPLY 3 GRAMS TO 3 LARGE JOINTS 3 TIMES A DAY AS NEEDED 123XX123 g 0  . folic acid (FOLVITE) 1 MG tablet Take 2 tablets (2 mg total) by mouth daily. 180 tablet 3  . furosemide (LASIX) 20 MG tablet Take 1 tablet (20 mg total) by mouth daily. Follow-up appt is due must see provider for future refills 30 tablet 0  . methotrexate (RHEUMATREX) 2.5 MG tablet TAKE 6 TABLETS BY MOUTH ONCE WEEKLY. CAUTION:CHEMOTHERAPY. PROTECT FROM LIGHT. 72 tablet 0  . OLUMIANT 2 MG TABS TAKE ONE TABLET (2MG ) BY MOUTH DAILY 90 tablet 0  . omeprazole (PRILOSEC) 40 MG capsule TAKE 1 TABLET (40 MG TOTAL) ONCE DAILY. 90 capsule 1  . PROLIA 60 MG/ML SOSY injection INJECT 60 MG INTO THE SKIN EVERY 6 (SIX) MONTHS. 1 mL 0  . Pyridoxine HCl (VITAMIN B-6 PO) Take 1 tablet by mouth daily.    . traMADol (ULTRAM) 50 MG tablet 1TL BY MOUTH TWICE A DAY 60 tablet 5  . predniSONE (DELTASONE) 1 MG tablet TAKE 4 TABLETS (4 MG TOTAL) BY MOUTH DAILY WITH BREAKFAST. 360 tablet 1   No current facility-administered medications on file prior to visit.     Past Medical History:  Diagnosis Date  . Anxiety    takes Valium daily as needed  . Bruises easily    d/t meds  . Cancer ([place])    basal cell ca, in situ- uterine   . Cataracts, bilateral   . Chronic back pain   . Dizziness    r/t to meds  . GERD (gastroesophageal reflux disease)    no meds on a regular basis but will take Tums if needed  . Headache(784.0)    r/t neck issues  . History of bronchitis 6-47yrs ago  . History of colon polyps    benign  . Hyperlipidemia    takes Atorvastatin on Mondays and Fridays  . Hypertension    takes Ramipril daily  . Joint pain   . Joint swelling   . Multiple sclerosis ([place])   . Neuromuscular disorder ([place])    Dr. Park Liter- [facility], follows [facility]  . Nocturia   . OA  (osteoarthritis)    Dr Jani Gravel weekly, RA- hands- knees- feet   . PONV (postoperative nausea and vomiting)    trouble urinating after surgery in 2014  . Rheumatoid arthritis(714.0)    Dr Estanislado Pandy takes [name] daily  . Right wrist fracture   . Skin cancer   . Urinary retention    sees Dr.Wrenn about 2 times a yr    Past Surgical History:  Procedure Laterality Date  . ABDOMINAL HYSTERECTOMY     1985  . APPENDECTOMY  with TAH  . BACK SURGERY     several  . BREAST BIOPSY Right    Results were negative  . BROW LIFT Bilateral 10/02/2016   Procedure: BILATERAL LOWER LID BLEPHAROPLASTY;  Surgeon: Clista Bernhardt, MD;  Location: [facility];  Service: Plastics;  Laterality: Bilateral;  . CARPAL TUNNEL RELEASE Right   . cataracts    . CERVICAL FUSION     Dr Joya Salm  . CERVICAL FUSION  12/31/2012   Dr Joya Salm  . CHEST TUBE INSERTION     for traumatic Pneumothorax  . COLONOSCOPY  07/16/2010   normal   . COLONOSCOPY W/ POLYPECTOMY  1997   negative since; Dr Deatra Ina  . ESOPHAGOGASTRODUODENOSCOPY  07/16/2010   normal  . eye lid raise    . EYE SURGERY Bilateral    cataracts removed - /w IOL  . JOINT REPLACEMENT    . LUMBAR FUSION     Dr Joya Salm  . NASAL SINUS SURGERY    . POSTERIOR CERVICAL FUSION/FORAMINOTOMY N/A 12/31/2012   Procedure: POSTERIOR LATERAL CERVICAL FUSION/FORAMINOTOMY LEVEL 1 CERVICAL THREE-FOUR WITH LATERAL MASS SCREWS;  Surgeon: Floyce Stakes, MD;  Location: [facility];  Service: Neurosurgery;  Laterality: N/A;  . PTOSIS REPAIR Bilateral 10/02/2016   Procedure: INTERNAL PTOSIS REPAIR;  Surgeon: Clista Bernhardt, MD;  Location: [facility];  Service: Plastics;  Laterality: Bilateral;  . SKIN BIOPSY    . THYROID SURGERY     R lobe removed- 1972, has grown back - CT last done- 2018  . TOTAL HIP ARTHROPLASTY  12/22/2011   Procedure: TOTAL HIP ARTHROPLASTY;  Surgeon: Kerin Salen, MD;  Location: [facility];  Service: Orthopedics;  Laterality: Left;  . TOTAL SHOULDER  ARTHROPLASTY    . TUBAL LIGATION      Social History   Socioeconomic History  . Marital status: Married    Spouse name: Not on file  . Number of children: Not on file  . Years of education: Not on file  . Highest education level: Not on file  Occupational History  . Occupation: retired  Scientific laboratory technician  . Financial resource strain: Not on file  . Food insecurity    Worry: Not on file    Inability: Not on file  . Transportation needs    Medical: Not on file    Non-medical: Not on file  Tobacco Use  . Smoking status: Former Smoker    Packs/day: 2.50    Years: 20.00    Pack years: 50.00    Types: Cigarettes    Quit date: 05/26/1981    Years since quitting: 37.8  . Smokeless tobacco: Never Used  . Tobacco comment: Smoked [phone], up to 2.5 ppd  Substance and Sexual Activity  . Alcohol use: Yes    Alcohol/week: 14.0 standard drinks    Types: 14 Cans of beer per week    Comment: 2% beer  . Drug use: Never  . Sexual activity: Yes  Lifestyle  . Physical activity    Days per week: Not on file    Minutes per session: Not on file  . Stress: Not on file  Relationships  . Social Herbalist on phone: Not on file    Gets together: Not on file    Attends religious service: Not on file    Active member of club or organization: Not on file    Attends meetings of clubs or organizations: Not on file    Relationship status: Not on file  Other Topics Concern  . Not on file  Social History Narrative  . Not on file    Family History  Problem Relation Age of Onset  . Cancer Mother        pancreatic  . Diabetes Mother   . Heart disease Father        Rheumatic  . Cancer Father        ? stomach  . Cancer Sister        stomach  . Cancer Maternal Aunt        X 4; ? primary  . Heart disease Paternal Aunt   . Cancer Maternal Grandmother        cervical  . Colon cancer Other        Aunts  . Multiple sclerosis Daughter   . Hypertension Neg Hx   . Stroke Neg Hx      Review of Systems  Constitutional: Negative for chills and fever.  HENT: Negative for trouble swallowing and voice change.   Respiratory: Negative for cough, shortness of breath and wheezing.   Cardiovascular: Negative for chest pain, palpitations and leg swelling.  Gastrointestinal:       No gerd  Musculoskeletal: Positive for arthralgias.  Neurological: Negative for light-headedness and headaches.       Objective:   Vitals:   03/25/19 1457  BP: 130/68  Pulse: 77  Temp: 98.3 F (36.8 C)  SpO2: 98%   BP Readings from Last 3 Encounters:  03/25/19 130/68  12/27/18 (!) 101/56  11/12/18 122/68   Wt Readings from Last 3 Encounters:  03/25/19 115 lb (52.2 kg)  12/27/18 118 lb 9.6 oz (53.8 kg)  10/04/18 114 lb (51.7 kg)   Body mass index is 21.03 kg/m.   Physical Exam    Constitutional: Appears well-developed and well-nourished. No distress.  HENT:  Head: Normocephalic and atraumatic.  Left cheek swollen near mouth - tender to touch.  Left cheek slightly tender throughout entire cheek, slight tenderness left anterior cervical region Neck: Neck supple. No tracheal deviation present. No thyromegaly present.  No cervical lymphadenopathy Cardiovascular: Normal rate, regular rhythm and normal heart sounds.   No murmur heard. No carotid bruit .  No edema Pulmonary/Chest: Effort normal and breath sounds normal. No respiratory distress. No has no wheezes. No rales.  Skin: Skin is warm and dry. Not diaphoretic.  Psychiatric: Normal mood and affect. Behavior is normal.      Assessment & Plan:    See Problem List for Assessment and Plan of chronic medical problems.

## 2019-03-24 NOTE — Patient Instructions (Addendum)
   Medications reviewed and updated.  Changes include :   none    A Ct scan of your face was ordered  Please followup in 6 months

## 2019-03-25 ENCOUNTER — Ambulatory Visit (INDEPENDENT_AMBULATORY_CARE_PROVIDER_SITE_OTHER): Payer: Medicare Other | Admitting: Internal Medicine

## 2019-03-25 ENCOUNTER — Encounter: Payer: Self-pay | Admitting: Internal Medicine

## 2019-03-25 ENCOUNTER — Other Ambulatory Visit: Payer: Self-pay

## 2019-03-25 VITALS — BP 130/68 | HR 77 | Temp 98.3°F | Ht 62.0 in | Wt 115.0 lb

## 2019-03-25 DIAGNOSIS — M0579 Rheumatoid arthritis with rheumatoid factor of multiple sites without organ or systems involvement: Secondary | ICD-10-CM

## 2019-03-25 DIAGNOSIS — R220 Localized swelling, mass and lump, head: Secondary | ICD-10-CM

## 2019-03-25 DIAGNOSIS — E7849 Other hyperlipidemia: Secondary | ICD-10-CM

## 2019-03-25 DIAGNOSIS — K219 Gastro-esophageal reflux disease without esophagitis: Secondary | ICD-10-CM | POA: Diagnosis not present

## 2019-03-25 DIAGNOSIS — I10 Essential (primary) hypertension: Secondary | ICD-10-CM | POA: Diagnosis not present

## 2019-03-25 DIAGNOSIS — G35 Multiple sclerosis: Secondary | ICD-10-CM

## 2019-03-25 NOTE — Assessment & Plan Note (Signed)
Cholesterol well controlled earlier this year Continue Lipitor

## 2019-03-25 NOTE — Assessment & Plan Note (Signed)
Following with Dr. Estanislado Pandy

## 2019-03-25 NOTE — Assessment & Plan Note (Signed)
Swelling left cheek near her mouth, entire left cheek tender Symptoms improved after 2 weeks of clindamycin Swollen salivary gland or infected salivary gland? Will obtain CT She will follow-up with Dr. Benjamine Mola

## 2019-03-25 NOTE — Assessment & Plan Note (Signed)
Following with Dr Sater 

## 2019-03-25 NOTE — Assessment & Plan Note (Signed)
Had hoarseness - silent GERD Improved with omeprazole 40 mg BID

## 2019-03-25 NOTE — Assessment & Plan Note (Signed)
BP well controlled Current regimen effective and well tolerated Continue current medications at current doses  

## 2019-03-28 ENCOUNTER — Other Ambulatory Visit: Payer: Self-pay | Admitting: Internal Medicine

## 2019-03-28 ENCOUNTER — Telehealth: Payer: Self-pay | Admitting: Internal Medicine

## 2019-03-28 DIAGNOSIS — I10 Essential (primary) hypertension: Secondary | ICD-10-CM

## 2019-03-28 NOTE — Telephone Encounter (Signed)
error 

## 2019-03-29 ENCOUNTER — Other Ambulatory Visit: Payer: Self-pay | Admitting: Rheumatology

## 2019-03-29 NOTE — Telephone Encounter (Signed)
Last Visit: 12/27/18 Next Visit: 06/01/19  Okay to refill per Dr. Estanislado Pandy

## 2019-03-30 ENCOUNTER — Other Ambulatory Visit (INDEPENDENT_AMBULATORY_CARE_PROVIDER_SITE_OTHER): Payer: Medicare Other

## 2019-03-30 DIAGNOSIS — I10 Essential (primary) hypertension: Secondary | ICD-10-CM

## 2019-03-30 LAB — BASIC METABOLIC PANEL
BUN: 7 mg/dL (ref 6–23)
CO2: 32 mEq/L (ref 19–32)
Calcium: 9 mg/dL (ref 8.4–10.5)
Chloride: 101 mEq/L (ref 96–112)
Creatinine, Ser: 0.8 mg/dL (ref 0.40–1.20)
GFR: 69.54 mL/min (ref >60.00)
Glucose, Bld: 97 mg/dL (ref 70–99)
Potassium: 4.2 mEq/L (ref 3.5–5.1)
Sodium: 138 mEq/L (ref 135–145)

## 2019-04-01 ENCOUNTER — Other Ambulatory Visit: Payer: Self-pay

## 2019-04-01 ENCOUNTER — Ambulatory Visit (INDEPENDENT_AMBULATORY_CARE_PROVIDER_SITE_OTHER)
Admission: RE | Admit: 2019-04-01 | Discharge: 2019-04-01 | Disposition: A | Discharge status: Home / Self Care | Payer: Medicare Other | Source: Ambulatory Visit | Attending: Internal Medicine | Admitting: Internal Medicine

## 2019-04-01 DIAGNOSIS — R220 Localized swelling, mass and lump, head: Secondary | ICD-10-CM | POA: Diagnosis not present

## 2019-04-01 MED ORDER — IOHEXOL 300 MG/ML  SOLN
75.0000 mL | Freq: Once | INTRAMUSCULAR | Status: AC | PRN
  Administered 2019-04-01: 75 mL via INTRAVENOUS

## 2019-04-03 ENCOUNTER — Encounter: Payer: Self-pay | Admitting: Internal Medicine

## 2019-04-05 ENCOUNTER — Other Ambulatory Visit: Payer: Self-pay | Admitting: Rheumatology

## 2019-04-05 DIAGNOSIS — M0579 Rheumatoid arthritis with rheumatoid factor of multiple sites without organ or systems involvement: Secondary | ICD-10-CM

## 2019-04-05 NOTE — Telephone Encounter (Signed)
Last Visit: 12/27/18 Next Visit: 06/01/19 Labs: 02/17/19 CMP WNL. RBC count is low but stable.  Okay to refill per Dr. Estanislado Pandy

## 2019-04-06 DIAGNOSIS — R221 Localized swelling, mass and lump, neck: Secondary | ICD-10-CM | POA: Diagnosis not present

## 2019-04-06 DIAGNOSIS — G501 Atypical facial pain: Secondary | ICD-10-CM | POA: Diagnosis not present

## 2019-04-08 ENCOUNTER — Encounter: Payer: Self-pay | Admitting: Internal Medicine

## 2019-04-11 MED FILL — OLUMIANT 2 MG TABS: 2 | 30 days supply | Qty: 30 | Fill #0

## 2019-04-15 ENCOUNTER — Other Ambulatory Visit: Payer: Self-pay | Admitting: Internal Medicine

## 2019-04-26 ENCOUNTER — Telehealth (INDEPENDENT_AMBULATORY_CARE_PROVIDER_SITE_OTHER): Payer: Medicare Other | Admitting: Rheumatology

## 2019-04-26 ENCOUNTER — Other Ambulatory Visit: Payer: Self-pay

## 2019-04-26 ENCOUNTER — Telehealth: Payer: Self-pay | Admitting: Rheumatology

## 2019-04-26 ENCOUNTER — Encounter: Payer: Self-pay | Admitting: Rheumatology

## 2019-04-26 ENCOUNTER — Encounter: Payer: Self-pay | Admitting: Internal Medicine

## 2019-04-26 DIAGNOSIS — Z79899 Other long term (current) drug therapy: Secondary | ICD-10-CM | POA: Diagnosis not present

## 2019-04-26 DIAGNOSIS — G35 Multiple sclerosis: Secondary | ICD-10-CM

## 2019-04-26 DIAGNOSIS — M1611 Unilateral primary osteoarthritis, right hip: Secondary | ICD-10-CM

## 2019-04-26 DIAGNOSIS — Z96642 Presence of left artificial hip joint: Secondary | ICD-10-CM

## 2019-04-26 DIAGNOSIS — Z8679 Personal history of other diseases of the circulatory system: Secondary | ICD-10-CM

## 2019-04-26 DIAGNOSIS — M810 Age-related osteoporosis without current pathological fracture: Secondary | ICD-10-CM | POA: Diagnosis not present

## 2019-04-26 DIAGNOSIS — M0579 Rheumatoid arthritis with rheumatoid factor of multiple sites without organ or systems involvement: Secondary | ICD-10-CM

## 2019-04-26 DIAGNOSIS — M791 Myalgia, unspecified site: Secondary | ICD-10-CM

## 2019-04-26 DIAGNOSIS — M7061 Trochanteric bursitis, right hip: Secondary | ICD-10-CM

## 2019-04-26 DIAGNOSIS — Z8659 Personal history of other mental and behavioral disorders: Secondary | ICD-10-CM

## 2019-04-26 DIAGNOSIS — M4312 Spondylolisthesis, cervical region: Secondary | ICD-10-CM

## 2019-04-26 DIAGNOSIS — Z8639 Personal history of other endocrine, nutritional and metabolic disease: Secondary | ICD-10-CM

## 2019-04-26 DIAGNOSIS — M5136 Other intervertebral disc degeneration, lumbar region: Secondary | ICD-10-CM

## 2019-04-26 DIAGNOSIS — S62101D Fracture of unspecified carpal bone, right wrist, subsequent encounter for fracture with routine healing: Secondary | ICD-10-CM

## 2019-04-26 DIAGNOSIS — R5383 Other fatigue: Secondary | ICD-10-CM

## 2019-04-26 DIAGNOSIS — F5101 Primary insomnia: Secondary | ICD-10-CM

## 2019-04-26 NOTE — Progress Notes (Signed)
Virtual Visit via Telephone Note  I connected with Sherri [name] on 04/26/19 at  3:15 PM EST by telephone and verified that I am speaking with the correct person using two identifiers.  Location: Patient: Home  Provider: Clinic  This service was conducted via virtual visit.  The patient was located at home. I was located in my office.  Consent was obtained prior to the virtual visit and is aware of possible charges through their insurance for this visit.  The patient is an established patient.  Dr. Estanislado Pandy, MD conducted the virtual visit and Hazel Sams, PA-C acted as scribe during the service.  Office staff helped with scheduling follow up visits after the service was conducted.   I discussed the limitations, risks, security and privacy concerns of performing an evaluation and management service by telephone and the availability of in person appointments. I also discussed with the patient that there may be a patient responsible charge related to this service. The patient expressed understanding and agreed to proceed.  CC: Pain in multiple joints  History of Present Illness: Patient is a 78 year old female with a past medical history of seropositive rheumatoid arthritis and osteoarthritis. She is taking Olumiant 2 mg 1 tablet daily, MTX 6 tablets po once weekly, folic acid 2 mg po daily, and prednisone 4 mg po daily.  She has not missed any doses of these medications recently. She is having increased pain in both shoulder joints for the past 1 week. She is having increased pain in both wrist joints but denies any joint swelling. She denies any recent overuse activities. She is also experiencing pain in bilateral TMJs.  She is having difficulty eating due to the discomfort.     Vitals obtained by patient today: 97.104F, Oxygen saturation 98%, HR 67, Weight 115, BP 141/73  Review of Systems  Constitutional: Negative for fever and malaise/fatigue.  Eyes: Negative for photophobia, pain,  discharge and redness.       +Dry eyes  Respiratory: Negative for cough, shortness of breath and wheezing.   Cardiovascular: Negative for chest pain and palpitations.  Gastrointestinal: Negative for blood in stool, constipation and diarrhea.  Genitourinary: Negative for dysuria.  Musculoskeletal: Positive for joint pain and myalgias. Negative for back pain and neck pain.       +Morning stiffness  Skin: Negative for rash.  Neurological: Negative for dizziness and headaches.  Psychiatric/Behavioral: Positive for depression. The patient is nervous/anxious. The patient does not have insomnia.       Observations/Objective: Physical Exam  Constitutional: She is oriented to person, place, and time.  Neurological: She is alert and oriented to person, place, and time.  Psychiatric: Mood, memory, affect and judgment normal.   Patient reports joint stiffness all day  Patient reports nocturnal pain.  Difficulty dressing/grooming: Denies Difficulty climbing stairs: Denies Difficulty getting out of chair: Denies Difficulty using hands for taps, buttons, cutlery, and/or writing: Reports  She rates her RA a 5/10.    Assessment and Plan: Visit Diagnoses: Rheumatoid arthritis involving multiple sites with positive rheumatoid factor ([place]) - She has been having increased pain in bilateral shoulder joints, bilateral wrist joints, and bilateral TMJ for the past 1 week.  She has not noticed any obvious joint swelling.  Her joint stiffness has been lasting all day.  She has not been performing any overuse activities recently.  She is taking Olumiant 2 mg 1 tablet daily, MTX 6 tablets once weekly, folic acid 2 mg po daily, and prednisone  4 mg po daily.  She has not missed any doses of these medications recently. She will continue on the current treatment regimen.  We will check a CK and ESR with her upcoming lab work since she is having increased arthralgias and myalgias in her upper extremities.  We will  notify her with the results. She will follow up in 3 months.   High risk medication use - Olumiant 2 mg 1 tablet daily (started in October 2019), methotrexate 2.5 mg 6 tablets every 7 days, folic acid 1 mg 2 tablets daily, and prednisone 4 mg daily.  Last TB gold negative on 11/03/2018 and will monitor yearly.  CBC and CMP were drawn on 02/17/19.  She is due to update lab work this month.  She will have lab work drawn before and after Prolia injection this month. Standing orders are in place.    Age-related osteoporosis without current pathological fracture -She is on Prolia 60 mg every 6 months with last injection on 11/12/2018.  She is due for Prolia on or after 05/14/19.  She is also on a calcium and vitamin D supplement daily. DEXA on 01/11/2018 showed T score of -3.9 at radius with BMD of 0.457.Negative 7.58% change in BMD from previous DEXA on 10/15/15.Last vitamin D level 57 on 08/31/2017.  Due for repeat DEXA in August 2021.  Closed fracture of right wrist with routine healing, subsequent encounter - She has ongoing right wrist joint pain. She has not noticed any joint swelling.  Primary osteoarthritis of right hip - She has occasional discomfort in the right hip joint.   History of total hip replacement, left - Doing well.   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 - Chronic pain and stiffness.   DDD (degenerative disc disease), lumbar - She has intermittent lower back pain.  No symptoms of radiculopathy at this time.  Trochanteric bursitis of right hip - She has intermittent discomfort. She had a cortisone injection on 02/17/18.   Other fatigue - Chronic but stable.   Other medical conditions are listed as follows:   History of multiple sclerosis   Primary insomnia  History of hyperlipidemia   History of depression   History of anxiety   History of hypertension   Follow Up Instructions: She will follow up in 3 months.    I discussed the assessment and  treatment plan with the patient. The patient was provided an opportunity to ask questions and all were answered. The patient agreed with the plan and demonstrated an understanding of the instructions.   The patient was advised to call back or seek an in-person evaluation if the symptoms worsen or if the condition fails to improve as anticipated.  I provided 25 minutes of non-face-to-face time during this encounter.   Bo Merino, MD  Scribed by-  Hazel Sams, PA-C

## 2019-04-26 NOTE — Telephone Encounter (Signed)
Patient left a voicemail stating she has increased pain in her arms, shoulders, and hands and is requesting to increase her Prednisone.

## 2019-04-26 NOTE — Telephone Encounter (Signed)
Patient states she noticed an increase in pain 1 week ago in bilateral arms, hands, shoulders and also her jaw. Patient is on 4mg  of prednisone daily and has been taking MTX and Olumiant on schedule. Patient is scheduled for a virtual visit this afternoon with Dr. Estanislado Pandy.

## 2019-04-27 ENCOUNTER — Telehealth: Payer: Self-pay

## 2019-04-27 ENCOUNTER — Other Ambulatory Visit: Payer: Self-pay | Admitting: Physician Assistant

## 2019-04-27 DIAGNOSIS — D044 Carcinoma in situ of skin of scalp and neck: Secondary | ICD-10-CM | POA: Diagnosis not present

## 2019-04-27 NOTE — Telephone Encounter (Signed)
Spoke with patient and advised patient prolia will be delayed to January 2021, patient verbalized understanding. Advised patient Luetta Nutting would follow up in January.

## 2019-04-27 NOTE — Telephone Encounter (Signed)
Patient had a virtual visit yesterday, 04/26/2019. Please follow up on prolia injection. Patient is due after 05/14/2019. Thanks!

## 2019-04-28 ENCOUNTER — Encounter: Payer: Self-pay | Admitting: Internal Medicine

## 2019-05-02 ENCOUNTER — Encounter: Payer: Self-pay | Admitting: Internal Medicine

## 2019-05-02 ENCOUNTER — Telehealth: Payer: Self-pay | Admitting: Rheumatology

## 2019-05-02 ENCOUNTER — Telehealth: Payer: Self-pay | Admitting: Neurology

## 2019-05-02 ENCOUNTER — Other Ambulatory Visit: Payer: Self-pay

## 2019-05-02 DIAGNOSIS — M0579 Rheumatoid arthritis with rheumatoid factor of multiple sites without organ or systems involvement: Secondary | ICD-10-CM

## 2019-05-02 DIAGNOSIS — Z79899 Other long term (current) drug therapy: Secondary | ICD-10-CM | POA: Diagnosis not present

## 2019-05-02 DIAGNOSIS — M791 Myalgia, unspecified site: Secondary | ICD-10-CM | POA: Diagnosis not present

## 2019-05-02 MED ORDER — AMLODIPINE BESYLATE 2.5 MG PO TABS: 2.5000 mg | ORAL_TABLET | Freq: Every day | ORAL | 3 refills | Status: DC

## 2019-05-02 NOTE — Telephone Encounter (Signed)
Lab orders have been released for [facility].  

## 2019-05-02 NOTE — Telephone Encounter (Signed)
Patient going to [facility] today for labs. Please release orders.

## 2019-05-02 NOTE — Telephone Encounter (Signed)
Pt called in and stated she has been in severe pain on the left side of her face , she wants to know if she can get a script for oxycodone 10mg . Please advise

## 2019-05-02 NOTE — Telephone Encounter (Signed)
Called pt back to get some more info. Left sided facial pain started two weeks ago. Has had this in the past but pain was not as severe. Tramadol ineffective (she has been taking 50mg  in the am and qhs. Pain worsening over time, BP elevated d/t pain. Wanting to know if oxycodone 10mg  tablet could be prescribed again. Has diazepam on hand but does not take this. I updated med list with pt while on the phone.  She was last seen 10/07/2018. Next f/u 05/25/19. Checked drug registry and she last refilled tramadol 50mg  04/13/19 #60. Last refilled diazepam 5mg  12/21/18 #30.

## 2019-05-03 ENCOUNTER — Encounter: Payer: Self-pay | Admitting: Internal Medicine

## 2019-05-03 LAB — CBC WITH DIFFERENTIAL/PLATELET
Absolute Monocytes: 299 cells/uL (ref 200–950)
Basophils Absolute: 9 cells/uL (ref 0–200)
Basophils Relative: 0.2 %
Eosinophils Absolute: 9 cells/uL — ABNORMAL LOW (ref 15–500)
Eosinophils Relative: 0.2 %
HCT: 36.1 % (ref 35.0–45.0)
Hemoglobin: 12.3 g/dL (ref 11.7–15.5)
Lymphs Abs: 672 cells/uL — ABNORMAL LOW (ref 850–3900)
MCH: 33.7 pg — ABNORMAL HIGH (ref 27.0–33.0)
MCHC: 34.1 g/dL (ref 32.0–36.0)
MCV: 98.9 fL (ref 80.0–100.0)
MPV: 9.7 fL (ref 7.5–12.5)
Monocytes Relative: 6.5 %
Neutro Abs: 3611 cells/uL (ref 1500–7800)
Neutrophils Relative %: 78.5 %
Platelets: 384 10*3/uL (ref 140–400)
RBC: 3.65 10*6/uL — ABNORMAL LOW (ref 3.80–5.10)
RDW: 13.3 % (ref 11.0–15.0)
Total Lymphocyte: 14.6 %
WBC: 4.6 10*3/uL (ref 3.8–10.8)

## 2019-05-03 LAB — COMPLETE METABOLIC PANEL WITH GFR
AG Ratio: 1.6 (calc) (ref 1.0–2.5)
ALT: 11 U/L (ref 6–29)
AST: 22 U/L (ref 10–35)
Albumin: 4.2 g/dL (ref 3.6–5.1)
Alkaline phosphatase (APISO): 54 U/L (ref 37–153)
BUN: 11 mg/dL (ref 7–25)
CO2: 28 mmol/L (ref 20–32)
Calcium: 9.3 mg/dL (ref 8.6–10.4)
Chloride: 98 mmol/L (ref 98–110)
Creat: 0.85 mg/dL (ref 0.60–0.93)
GFR, Est African American: 77 mL/min/{1.73_m2} (ref >=60)
GFR, Est Non African American: 66 mL/min/{1.73_m2} (ref >=60)
Globulin: 2.6 g/dL (calc) (ref 1.9–3.7)
Glucose, Bld: 98 mg/dL (ref 65–139)
Potassium: 4.2 mmol/L (ref 3.5–5.3)
Sodium: 136 mmol/L (ref 135–146)
Total Bilirubin: 0.4 mg/dL (ref 0.2–1.2)
Total Protein: 6.8 g/dL (ref 6.1–8.1)

## 2019-05-03 LAB — CK: Total CK: 75 U/L (ref 29–143)

## 2019-05-03 LAB — SEDIMENTATION RATE: Sed Rate: 51 mm/h — ABNORMAL HIGH (ref 0–30)

## 2019-05-03 MED ORDER — OXYCODONE HCL 10 MG PO TABS: ORAL_TABLET | ORAL | 0 refills | Status: DC

## 2019-05-03 NOTE — Telephone Encounter (Signed)
Spoke with MD, she should stop tramadol and only take diazepam once daily if she is taking oxycodone

## 2019-05-03 NOTE — Telephone Encounter (Signed)
Ok we can do oxycodone 10 mg #60 one po bid prn

## 2019-05-03 NOTE — Progress Notes (Signed)
CBC and CMP are within normal limits.

## 2019-05-03 NOTE — Progress Notes (Signed)
CK WNL.  ESR is elevated-51.  Dr. Estanislado Pandy would like the patient to increase prednisone to 6 mg po daily.  She can taper as tolerated.

## 2019-05-03 NOTE — Telephone Encounter (Signed)
Called pt and relayed information below. She would like option to take tramadol during the day and oxycodone only at bedtime. I messaged Dr Felecia Shelling. He said this would be fine as long as she does not take more than 2 of each med per day. I relayed this to the patient. She verbalized understanding.

## 2019-05-05 ENCOUNTER — Encounter: Payer: Self-pay | Admitting: Internal Medicine

## 2019-05-09 ENCOUNTER — Telehealth: Payer: Self-pay | Admitting: Pharmacy Technician

## 2019-05-09 NOTE — Telephone Encounter (Signed)
Received notification from [facility] regarding a prior authorization for [place]. Authorization has been APPROVED from 05/09/19 to 05/25/20.   Will send document to scan center.  Authorization # [id number] Phone # [phone]

## 2019-05-09 NOTE — Telephone Encounter (Signed)
Submitted a Prior Authorization request to [facility] for [place] via Cover My Meds. Will update once we receive a response.  1:58 PM Beatriz Chancellor, CPhT

## 2019-05-11 ENCOUNTER — Telehealth: Payer: Self-pay | Admitting: Rheumatology

## 2019-05-11 MED ORDER — PREDNISONE 5 MG PO TABS: ORAL_TABLET | ORAL | 0 refills | Status: DC

## 2019-05-11 MED ORDER — PREDNISONE 1 MG PO TABS: 1.0000 mg | ORAL_TABLET | Freq: Every day | ORAL | 0 refills | Status: DC

## 2019-05-11 NOTE — Telephone Encounter (Signed)
Patient is taking 6 Prednisone pills now due to the increase dose. Patient would like to get a rx for 5mg  Prednisone sent to [facility] [address] if possible, so she does not need to take so many tiny pills at once. Please call to advise.

## 2019-05-11 NOTE — Telephone Encounter (Signed)
Patient was instructed to increase prednisone to 6mg  po qd on 05/03/2019 due to lab results.   Patient states she has 1mg  tablets and requested a prescription of 5mg  tablets to be sent to the pharmacy. Prescription has been sent and med list has been updated to reflect 6mg  po qd.

## 2019-05-13 ENCOUNTER — Encounter: Payer: Self-pay | Admitting: Internal Medicine

## 2019-05-14 ENCOUNTER — Other Ambulatory Visit: Payer: Self-pay | Admitting: Internal Medicine

## 2019-05-16 ENCOUNTER — Ambulatory Visit: Payer: Medicare Other

## 2019-05-18 ENCOUNTER — Other Ambulatory Visit: Payer: Self-pay | Admitting: Neurology

## 2019-05-18 MED FILL — OLUMIANT 2 MG TABS: 2 | 30 days supply | Qty: 30 | Fill #1

## 2019-05-22 ENCOUNTER — Other Ambulatory Visit: Payer: Self-pay | Admitting: Rheumatology

## 2019-05-23 ENCOUNTER — Telehealth: Payer: Self-pay | Admitting: Rheumatology

## 2019-05-23 ENCOUNTER — Telehealth: Payer: Self-pay

## 2019-05-23 MED ORDER — PREDNISONE 5 MG PO TABS: ORAL_TABLET | ORAL | 0 refills | Status: DC

## 2019-05-23 NOTE — Telephone Encounter (Signed)
Patient requested refill of 5mg  tablets.   Last Visit: 04/26/2019 Next Visit: 07/28/2019  Okay to refill per Dr. Estanislado Pandy.

## 2019-05-23 NOTE — Telephone Encounter (Signed)
Patient states she would like to follow up with you in regards to Prolia. Thanks!

## 2019-05-23 NOTE — Telephone Encounter (Signed)
Patient called requesting prescription refill of Prednisone to be [facility] to [facility] [address] in [place].

## 2019-05-25 ENCOUNTER — Other Ambulatory Visit: Payer: Self-pay

## 2019-05-25 ENCOUNTER — Encounter: Payer: Self-pay | Admitting: Neurology

## 2019-05-25 ENCOUNTER — Telehealth: Payer: Self-pay | Admitting: Rheumatology

## 2019-05-25 ENCOUNTER — Ambulatory Visit: Payer: Medicare Other | Admitting: Neurology

## 2019-05-25 ENCOUNTER — Encounter: Payer: Self-pay | Admitting: Internal Medicine

## 2019-05-25 ENCOUNTER — Telehealth: Payer: Self-pay

## 2019-05-25 VITALS — BP 150/90 | HR 69 | Temp 97.3°F | Wt 113.0 lb

## 2019-05-25 DIAGNOSIS — G35 Multiple sclerosis: Secondary | ICD-10-CM

## 2019-05-25 DIAGNOSIS — R5383 Other fatigue: Secondary | ICD-10-CM

## 2019-05-25 DIAGNOSIS — F5101 Primary insomnia: Secondary | ICD-10-CM

## 2019-05-25 DIAGNOSIS — M0579 Rheumatoid arthritis with rheumatoid factor of multiple sites without organ or systems involvement: Secondary | ICD-10-CM | POA: Diagnosis not present

## 2019-05-25 DIAGNOSIS — Z79899 Other long term (current) drug therapy: Secondary | ICD-10-CM

## 2019-05-25 DIAGNOSIS — G5603 Carpal tunnel syndrome, bilateral upper limbs: Secondary | ICD-10-CM

## 2019-05-25 DIAGNOSIS — R269 Unspecified abnormalities of gait and mobility: Secondary | ICD-10-CM

## 2019-05-25 DIAGNOSIS — R350 Frequency of micturition: Secondary | ICD-10-CM

## 2019-05-25 MED ORDER — OXYCODONE HCL 10 MG PO TABS: ORAL_TABLET | ORAL | 0 refills | Status: DC

## 2019-05-25 MED ORDER — LAMOTRIGINE 100 MG PO TABS: 100.0000 mg | ORAL_TABLET | Freq: Two times a day (BID) | ORAL | 5 refills | Status: DC

## 2019-05-25 NOTE — Telephone Encounter (Signed)
Lab orders faxed to Dr. Quay Burow.

## 2019-05-25 NOTE — Telephone Encounter (Signed)
Returned patient's call. She wanted to schedule her Prolia injection.  Advised that she will need lab work before she can schedule an appointment.   She plans to have her labs drawn on 05/29/2018.  Will release orders that morning.  Instructed patient to call to schedule her appointment for Prolia injection.  Patient verbalized understanding.  She asked if the price of [name] has come down as she still has breakthrough pain with Olumiant.  Advised that the price has not come down to be comparable to Olumiant but we can run a test claim at the beginning of the year through her insurance to double check.  Patient verbalized understanding.  All questions encouraged and answered.  Instructed patient to call with any other questions or concerns. Mariella Saa, PharmD, [place], [place] Clinical Specialty Pharmacist [phone]  05/25/2019 9:39 AM

## 2019-05-25 NOTE — Telephone Encounter (Signed)
Patient called requesting her labwork orders be faxed to Dr. Quay Burow at [phone]

## 2019-05-25 NOTE — Progress Notes (Signed)
[facility]  PATIENT: Sherri [name] DOB: [birth year]  REFERRING CLINICIAN: Unice Cobble  HISTORY FROM: patient REASON FOR VISIT: Multiple sclerosis   HISTORICAL  CHIEF COMPLAINT:  Chief Complaint  Patient presents with  . Follow-up    MS follow  up room 12 pt    HISTORY OF PRESENT ILLNESS:  [name] is a 78 year old woman who was diagnosed with relapsing remitting MS in 2010.     Update 05/25/19: She is reporting more pain in her hands.   She has had CTR on the right many years ago by Dr. Joya Salm.  She also has pain over the MCP joint.  She does have RA.   Last year she broke her right wrist with a fall.   Her leg just buckled when she stood up.   She takes tramadol 50 mg po qAM and PM and oxycodone 10 mg at night.    Her leg has buckled a few other times.    She has left facial pain.   She had CT maxillofacial and it showed sequelae of prior fillers ( after eyelid lift surgery complicated by a tear)  but no other changes.   She notes reduced sensation V1 and V2 on the left.      She also has RA and is on Methotrexate.    She is not on any the disease modifying therapy for the MS.  She feels the MS has actually been pretty stable with no change in gait.  She is concerned that the new symptoms in the hands could be due to the MS though I told her I believe that more likely to be due to to the rheumatoid arthritis or carpal tunnel syndrome.   Update 10/07/18 (virtual) Her MS is stable.   She stopped Aubagio due to hair loss and did not go back on a DMT.  Her gait has  worsened but she notes more orthopedics/RA issues.   Strenth is poor in prox right arm due to a biceps tendon tear.    She is on methotrexate and Olumiant (baricitinib) and prednisone.   These have helped her RA.   She is also on Prolia for osteoporosis.  She had a bad infection after her cat jumped on her and pierced her skin.  We discussed RA meds may alter the immune system.  She is aware  and is trying to follow CDC guidance to reduce her Covid-19 risk.    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  She has had hip pain since.  She saw orthopedics and her replaced hip easily pops out of her socket (with bending over a few times).  She has been able to pop it back in but the pain can be severe.    Anxiety is higher since the hip problems worsened as she is constantly worried about it dislocating again.   She does not feel more depressed but is more worried with her health issues and CoVid-19   She has not had anymore episodes of passing out.  One episidoe occurred with Valsalva last year      Update 04/14/2018: She is being switched from [place] to [name] (Upadacitinib) and then to  Olumiant (baricitinib).  [name] was not covered by insurance.   She has been told she had bursitis in the right hip    Pain is in the buttock to the lateral thigh and proximal knee.   Due to foot pain  she feels her gait is poor.   Pain is in the ball of the foot and the toes.   The MS has ben mostly stable.   She did not tolerate Aubagio due to hair loss and stopped.    She is currently off a DMT.    Her gait is worse mostly due to more pain.   However, balance seems worse.  She denies numbness.    Bladder function is unchanged.       She had one episode of syncope while defecating and when she fell, she broke bones in the right foot  Update 11/10/2017: She feels her MS has been stable.  She is off of any MS DMT but is on Xeljanz and Methotrexate for RA.    She is on prednisone 5 mg (10 mg for flares) daily.   Pain has been much worse on attempts to go off prednisone.    Her MS is mild so I'm fine with her off a DMT.     Aubagio caused a lot of hair loss.    We discussed avoiding Enbrel/Remicade/Humira/Simponi  She was found to have C7, C8 and T1 radiculopathy on recent NCV/EMG.   She has fusion from C3 to C7.   She has a cervical spine MRI later this week.     Cervical spine 3 or 4 years ago showed fusion from C3-C7.  There is possible pseudoarthrosis at C6-C7 and minimal spondylolisthesis at C7-T1.  There is  She has had some dysphagia but it is felt possibly due to an enlarged thyroid (felt due to cysts on Bx)     Update 06/09/2017: She feels the MS is stable.   No new weakness or numbness and gait is unchanged. She remains on methotrexate and Xeljanz for her rheumatoid arthritis. She is off of her MS disease modifying therapy. She did not tolerate leflunomide due to hair loss.  She reports more hip pain that radiates into her leg all the way to the side of the foot..   A recent hip injection by Dr. Estanislado Pandy did not help.   Of note she has had L4L5 and L5S1 fusion by Dr. Joya Salm in the past.     Her gabapentin dose is currently 100 in am and 100 mg at night.   He had trouble tolerating 200 mg in the morning and 300 mg at night.   Gabapentin did help her RLS and she tolerates it well.   It does not help her pain..      From 10/30/2016: MS:   She feels her MS is stable.    She also has RA and is on Xeljanz and prednisone.    She is not on any DMT for her MS.     She denies any recent exacerbation.     Her right leg sometimes gives out on her but the changes have been gradual.     Vision:    She had recent surgery for ptosis (10/02/16) after she had cataract surgery (still had reduced peripheral vision after initil surgery when she noted the problem more).      RA:   She remains on Xeljanz and methotrexate.   MTX dose reduced as higher does may have affected renal function and dose was reduced.     Gait/strength:    She feels gait is worse and she notes dragging her right foot more, even for short distances.    He also notes more cramps in the right leg.  The right leg also sometimes gives out randomly.  Gait also affected by right hip pain and she also had left THR in the past.      She feels gait is not bad enough to use a cane but does feel off  balanced.    The legs (R>L) also ache a lot more.  Tramadol during the day and oxycodone and valium at night help the pain a lot.   Leg cramps are worse when she lays down.    Sensation:   She notes mild foot numbness bilaterally which is stable.     She has stable left facial numbness  in V2 and V3 > V1.   This was her first symptom (tongue was numb many years before diagnosis).    No arm numbness.     Bladder:   She has urinary frequency and hesitancy.   She does not completely empty (has PVR's at urology).    She has no recent UTI's,       Fatigue/sleep:     Fatigue is mild most days but worse a day after 'overdoing it'.    She is sleeping well with Valium 5 mg and Oxycodone 10 mg at bedtime .   He sleeps better with her pain under better control.    She is active, walking daily.    Mood/cognition:     Mood is doing well.    She does not note that she has any significant cognitive changes.       MS History:   When she was in her late 23s she had an episode of right-sided numbness and she lost the use of her right arm for a couple of weeks. At that time, she was told that she might have MS. Over the next 30 or 40 years she had several more similar but milder episodes. In 2010, she had an MRI of the cervical spine (for arm pain) and it showed foci c/w multiple sclerosis. An MRI of the brain was also performed  shortly thereafter showing white matter lesions consistent with the diagnosis.  Initially, she was placed on Avonex. She also has rheumatoid arthritis and was placed on methotrexate and then a combination of methotrexate with [name]. We decided to stop the Avonex as she probably would get some benefit from those medications for the MS as well. For the most part her MS has been stable over the past 5 years.However, early 2016,  she had numbness in the left face and weakness in her legs   REVIEW OF SYSTEMS:  Constitutional: No fevers, chills, sweats, or change in appetite Eyes: No visual  changes, double vision, eye pain Ear, nose and throat: No hearing loss, ear pain, nasal congestion, sore throat Cardiovascular: No chest pain, palpitations Respiratory:  No shortness of breath at rest or with exertion.   No wheezes GastrointestinaI: No nausea, vomiting, diarrhea, abdominal pain, fecal incontinence Genitourinary:  as above. Musculoskeletal:  No neck pain, back pain Integumentary: No rash, pruritus, skin lesions Neurological: as above Psychiatric: he has had a lot of family stress leading to some depression and more anxiety. Endocrine: No palpitations, diaphoresis, change in appetite, change in weigh or increased thirst Hematologic/Lymphatic:  No anemia, purpura, petechiae. Allergic/Immunologic: No itchy/runny eyes, nasal congestion, recent allergic reactions, rashes  ALLERGIES: Allergies  Allergen Reactions  . Darvon [Propoxyphene Hcl] Shortness Of Breath    ? Dose Related ? Lowered respirations greatly  . Meperidine Hcl Shortness Of Breath  . Penicillins Hives and Other (  See Comments)    Has patient had a PCN reaction causing immediate rash, facial/tongue/throat swelling, SOB or lightheadedness with hypotension: No SEVERE RASH INVOLVING MUCUS MEMBRANES or SKIN NECROSIS: #  #  #  YES  #  #  #  Has patient had a PCN reaction that required hospitalization No Has patient had a PCN reaction occurring within the last 10 years: No.   . Azathioprine Other (See Comments)    Weakness  . Leflunomide     unknown  . Ezetimibe-Simvastatin Nausea And Vomiting    HOME MEDICATIONS:   PAST MEDICAL HISTORY: Past Medical History:  Diagnosis Date  . Anxiety    takes Valium daily as needed  . Bruises easily    d/t meds  . Cancer ([place])    basal cell ca, in situ- uterine   . Cataracts, bilateral   . Chronic back pain   . Dizziness    r/t to meds  . GERD (gastroesophageal reflux disease)    no meds on a regular basis but will take Tums if needed  . Headache(784.0)    r/t  neck issues  . History of bronchitis 6-26yrs ago  . History of colon polyps    benign  . Hyperlipidemia    takes Atorvastatin on Mondays and Fridays  . Hypertension    takes Ramipril daily  . Joint pain   . Joint swelling   . Multiple sclerosis ([place])   . Neuromuscular disorder ([place])    Dr. Park Liter- [facility], follows [facility].  . Nocturia   . OA (osteoarthritis)    Dr Jani Gravel weekly, RA- hands- knees- feet   . PONV (postoperative nausea and vomiting)    trouble urinating after surgery in 2014  . Rheumatoid arthritis(714.0)    Dr Estanislado Pandy takes [name] daily  . Right wrist fracture   . Skin cancer   . Urinary retention    sees Dr.Wrenn about 2 times a yr    PAST SURGICAL HISTORY: Past Surgical History:  Procedure Laterality Date  . ABDOMINAL HYSTERECTOMY     1985  . APPENDECTOMY     with TAH  . BACK SURGERY     several  . BREAST BIOPSY Right    Results were negative  . BROW LIFT Bilateral 10/02/2016   Procedure: BILATERAL LOWER LID BLEPHAROPLASTY;  Surgeon: Clista Bernhardt, MD;  Location: [facility];  Service: Plastics;  Laterality: Bilateral;  . CARPAL TUNNEL RELEASE Right   . cataracts    . CERVICAL FUSION     Dr Joya Salm  . CERVICAL FUSION  12/31/2012   Dr Joya Salm  . CHEST TUBE INSERTION     for traumatic Pneumothorax  . COLONOSCOPY  07/16/2010   normal   . COLONOSCOPY W/ POLYPECTOMY  1997   negative since; Dr Deatra Ina  . ESOPHAGOGASTRODUODENOSCOPY  07/16/2010   normal  . eye lid raise    . EYE SURGERY Bilateral    cataracts removed - /w IOL  . JOINT REPLACEMENT    . LUMBAR FUSION     Dr Joya Salm  . NASAL SINUS SURGERY    . POSTERIOR CERVICAL FUSION/FORAMINOTOMY N/A 12/31/2012   Procedure: POSTERIOR LATERAL CERVICAL FUSION/FORAMINOTOMY LEVEL 1 CERVICAL THREE-FOUR WITH LATERAL MASS SCREWS;  Surgeon: Floyce Stakes, MD;  Location: [facility];  Service: Neurosurgery;  Laterality: N/A;  . PTOSIS REPAIR Bilateral 10/02/2016   Procedure: INTERNAL PTOSIS  REPAIR;  Surgeon: Clista Bernhardt, MD;  Location: [facility];  Service: Plastics;  Laterality: Bilateral;  .  SKIN BIOPSY    . THYROID SURGERY     R lobe removed- 1972, has grown back - CT last done- 2018  . TOTAL HIP ARTHROPLASTY  12/22/2011   Procedure: TOTAL HIP ARTHROPLASTY;  Surgeon: Kerin Salen, MD;  Location: [facility];  Service: Orthopedics;  Laterality: Left;  . TOTAL SHOULDER ARTHROPLASTY    . TUBAL LIGATION      FAMILY HISTORY: Family History  Problem Relation Age of Onset  . Cancer Mother        pancreatic  . Diabetes Mother   . Heart disease Father        Rheumatic  . Cancer Father        ? stomach  . Cancer Sister        stomach  . Cancer Maternal Aunt        X 4; ? primary  . Heart disease Paternal Aunt   . Cancer Maternal Grandmother        cervical  . Colon cancer Other        Aunts  . Multiple sclerosis Daughter   . Hypertension Neg Hx   . Stroke Neg Hx     SOCIAL HISTORY:  Social History   Socioeconomic History  . Marital status: Married    Spouse name: Not on file  . Number of children: Not on file  . Years of education: Not on file  . Highest education level: Not on file  Occupational History  . Occupation: retired  Tobacco Use  . Smoking status: Former Smoker    Packs/day: 2.50    Years: 20.00    Pack years: 50.00    Types: Cigarettes    Quit date: 05/26/1981    Years since quitting: 38.0  . Smokeless tobacco: Never Used  . Tobacco comment: Smoked [phone], up to 2.5 ppd  Substance and Sexual Activity  . Alcohol use: Yes    Alcohol/week: 14.0 standard drinks    Types: 14 Cans of beer per week    Comment: 2% beer  . Drug use: Never  . Sexual activity: Yes  Other Topics Concern  . Not on file  Social History Narrative  . Not on file   Social Determinants of Health   Financial Resource Strain:   . Difficulty of Paying Living Expenses: Not on file  Food Insecurity:   . Worried About Charity fundraiser in the Last Year: Not on file    . Ran Out of Food in the Last Year: Not on file  Transportation Needs:   . Lack of Transportation (Medical): Not on file  . Lack of Transportation (Non-Medical): Not on file  Physical Activity:   . Days of Exercise per Week: Not on file  . Minutes of Exercise per Session: Not on file  Stress:   . Feeling of Stress : Not on file  Social Connections:   . Frequency of Communication with Friends and Family: Not on file  . Frequency of Social Gatherings with Friends and Family: Not on file  . Attends Religious Services: Not on file  . Active Member of Clubs or Organizations: Not on file  . Attends Archivist Meetings: Not on file  . Marital Status: Not on file  Intimate Partner Violence:   . Fear of Current or Ex-Partner: Not on file  . Emotionally Abused: Not on file  . Physically Abused: Not on file  . Sexually Abused: Not on file     PHYSICAL EXAM  Vitals:  05/25/19 1310  BP: (!) 150/90  Pulse: 69  Temp: (!) 97.3 F (36.3 C)  Weight: 113 lb (51.3 kg)    Body mass index is 20.67 kg/m.   General: The patient is well-developed and well-nourished and in no acute distress  Musculoskeletal:   He has some piriformis tenderness on the right.  There is tenderness over the MCP and CMC joints at the thumb base on the right.     Skin: Extremities are without rash or edema.  Neurologic Exam  Mental status: The patient is alert and oriented x 3 at the time of the examination. The patient has apparent normal recent and remote memory, with an apparently normal attention span and concentration ability.   Speech is normal.  CN:  EOMI, Intact facial sensation..  Facial strength is normal.  Trapezius strength is normal..  Palatal elevation is normal and Tongue protrusion midline.    No obvious hearing deficits are noted.  Motor:  Right biceps bulges (she has a history of rupture).   Tone is mildly increased in her legs, left > right. Strength appears 5 / 5 in all 4  extremities except 4/5 ulnar innervated intrinsic hand muscles on the right.     Sensory: Sensory testing is intact to touch and vibration in the arms and legs.  Coordination: Cerebellar testing reveals good rapid alternating movements in hands . Reduced heel to shin, worse on the right.     Gait and station: Station is stable.  Gait is arthritic and mildly wide.  She has a mild right foot drop.  Tandem gait is wide.  Romberg is negative   Reflexes: Deep tendon reflexes are normal and symmetric.    DIAGNOSTIC DATA (LABS, IMAGING, TESTING) - I reviewed patient records, labs, notes, testing and imaging myself where available.  Lab Results  Component Value Date   WBC 4.6 05/02/2019   HGB 12.3 05/02/2019   HCT 36.1 05/02/2019   MCV 98.9 05/02/2019   PLT 384 05/02/2019      Component Value Date/Time   NA 136 05/02/2019 1344   NA 141 07/01/2016 1516   NA 136 02/22/2015 0000   K 4.2 05/02/2019 1344   K 4.4 02/22/2015 0000   CL 98 05/02/2019 1344   CL 98 02/22/2015 0000   CO2 28 05/02/2019 1344   CO2 30 02/22/2015 0000   GLUCOSE 98 05/02/2019 1344   BUN 11 05/02/2019 1344   BUN 9 07/01/2016 1516   CREATININE 0.85 05/02/2019 1344   CALCIUM 9.3 05/02/2019 1344   CALCIUM 9.6 02/22/2015 0000   PROT 6.8 05/02/2019 1344   PROT 6.7 07/01/2016 1516   PROT 6.6 02/22/2015 0000   ALBUMIN 4.3 10/26/2017 1527   ALBUMIN 4.5 07/01/2016 1516   ALBUMIN 4.3 02/22/2015 0000   AST 22 05/02/2019 1344   AST 24 02/22/2015 0000   ALT 11 05/02/2019 1344   ALT 18 02/22/2015 0000   ALKPHOS 47 10/26/2017 1527   ALKPHOS 34 02/22/2015 0000   BILITOT 0.4 05/02/2019 1344   BILITOT 0.3 07/01/2016 1516   GFRNONAA 66 05/02/2019 1344   GFRAA 77 05/02/2019 1344   Lab Results  Component Value Date   CHOL 188 08/05/2018   HDL 72 08/05/2018   LDLCALC 96 08/05/2018   LDLDIRECT 122.9 11/15/2012   TRIG 103 08/05/2018   CHOLHDL 2.6 08/05/2018         ASSESSMENT AND PLAN  Multiple sclerosis  (HCC) - Plan: NCV with EMG(electromyography)  Rheumatoid arthritis involving multiple  sites with positive rheumatoid factor ([place]) - Plan: NCV with EMG(electromyography)  Gait disturbance  Primary insomnia  High risk medication use  Urinary frequency  Other fatigue  Carpal tunnel syndrome on both sides - Plan: NCV with EMG(electromyography)   1.   She will continue off of DMTs for MS. 2.   I can refill her oxycodone if once a day.  If pain worsens would want her to see a pain management practice 3.   Lamotrigine for facial pain 4.   NCV/EMG for probable CTS 5.   rtc 4 months sooner if problems   Dustin Bumbaugh A. Felecia Shelling, MD, PhD [id number], [id number] PM Certified in Neurology, Clinical Neurophysiology, Sleep Medicine, Pain Medicine and Neuroimaging  [facility] [address] [place],  [address] [phone]

## 2019-05-25 NOTE — Telephone Encounter (Signed)
Copied from [place] [id number]. Topic: General - Other >> May 25, 2019  3:29 PM Rayann Heman wrote: Reason for CRM: pt called and stated that rheumatology is faxing over lab orders and would like to know if we have received. Please advise

## 2019-05-25 NOTE — Telephone Encounter (Signed)
Is this ok to do.Marland KitchenJohny Chess

## 2019-05-25 NOTE — Telephone Encounter (Signed)
Pt called in and stated that she was not sure the name of the lab but she stated that " it was the lab work that she has to do 10 days prior to the prolia injection"  She is going to get office to refax them to Dr Quay Burow.

## 2019-05-25 NOTE — Patient Instructions (Signed)
The pharmacy has the prescription for lamotrigine 100 mg tablets. For 5 days, just take one half pill a day. For the next 5 days, take one half pill twice a day. For the next 5 days, take one half pill 3 times a day Then start taking one pill twice a day from this point on.    In the future, we may increase the dose further.  If you get a rash, need to stop the medication and not take it again. 

## 2019-05-26 ENCOUNTER — Telehealth: Payer: Self-pay | Admitting: Internal Medicine

## 2019-05-26 NOTE — Telephone Encounter (Signed)
Patient is calling to request an order for her lab work. Patient is needling lab work to receive her projelia from Dr. Estanislado Pandy.  Dr. Estanislado Pandy office was supposed to fax orders of what labs the patient is needing. Please advise Patient is unable to get in at any other lab CB- [phone]

## 2019-05-30 ENCOUNTER — Telehealth: Payer: Self-pay | Admitting: Rheumatology

## 2019-05-30 ENCOUNTER — Encounter: Payer: Self-pay | Admitting: Internal Medicine

## 2019-05-30 DIAGNOSIS — Z79899 Other long term (current) drug therapy: Secondary | ICD-10-CM

## 2019-05-30 NOTE — Telephone Encounter (Signed)
Patient called requesting her labwork orders be sent to [facility] [address].  Patient states she has an appointment today, 05/29/18 at 2:00 pm.

## 2019-05-30 NOTE — Telephone Encounter (Signed)
Lab Orders released.  

## 2019-05-30 NOTE — Telephone Encounter (Signed)
Patient got orders to go to [facility].

## 2019-05-30 NOTE — Telephone Encounter (Signed)
Getting labs done at [facility].

## 2019-05-30 NOTE — Telephone Encounter (Signed)
Notified pt w/MD response. Pt states she had labs done w/[facility] will not need to come in.Sherri KitchenJohny Chess

## 2019-05-31 LAB — CBC WITH DIFFERENTIAL/PLATELET
Basophils Absolute: 0 10*3/uL (ref 0.0–0.2)
Basos: 0 %
EOS (ABSOLUTE): 0 10*3/uL (ref 0.0–0.4)
Eos: 0 %
Hematocrit: 34.6 % (ref 34.0–46.6)
Hemoglobin: 12.2 g/dL (ref 11.1–15.9)
Immature Grans (Abs): 0.2 10*3/uL — ABNORMAL HIGH (ref 0.0–0.1)
Immature Granulocytes: 4 %
Lymphocytes Absolute: 0.7 10*3/uL (ref 0.7–3.1)
Lymphs: 16 %
MCH: 34.6 pg — ABNORMAL HIGH (ref 26.6–33.0)
MCHC: 35.3 g/dL (ref 31.5–35.7)
MCV: 98 fL — ABNORMAL HIGH (ref 79–97)
Monocytes Absolute: 0.4 10*3/uL (ref 0.1–0.9)
Monocytes: 9 %
Neutrophils Absolute: 3.2 10*3/uL (ref 1.4–7.0)
Neutrophils: 71 %
Platelets: 352 10*3/uL (ref 150–450)
RBC: 3.53 x10E6/uL — ABNORMAL LOW (ref 3.77–5.28)
RDW: 13.2 % (ref 11.7–15.4)
WBC: 4.5 10*3/uL (ref 3.4–10.8)

## 2019-05-31 LAB — CMP14+EGFR
ALT: 12 IU/L (ref 0–32)
AST: 26 IU/L (ref 0–40)
Albumin/Globulin Ratio: 1.8 (ref 1.2–2.2)
Albumin: 4.4 g/dL (ref 3.7–4.7)
Alkaline Phosphatase: 54 IU/L (ref 39–117)
BUN/Creatinine Ratio: 12 (ref 12–28)
BUN: 13 mg/dL (ref 8–27)
Bilirubin Total: 0.3 mg/dL (ref 0.0–1.2)
CO2: 24 mmol/L (ref 20–29)
Calcium: 9.3 mg/dL (ref 8.7–10.3)
Chloride: 99 mmol/L (ref 96–106)
Creatinine, Ser: 1.1 mg/dL — ABNORMAL HIGH (ref 0.57–1.00)
GFR calc Af Amer: 56 mL/min/{1.73_m2} — ABNORMAL LOW (ref >59)
GFR calc non Af Amer: 49 mL/min/{1.73_m2} — ABNORMAL LOW (ref >59)
Globulin, Total: 2.4 g/dL (ref 1.5–4.5)
Glucose: 110 mg/dL — ABNORMAL HIGH (ref 65–99)
Potassium: 5.4 mmol/L — ABNORMAL HIGH (ref 3.5–5.2)
Sodium: 137 mmol/L (ref 134–144)
Total Protein: 6.8 g/dL (ref 6.0–8.5)

## 2019-05-31 NOTE — Telephone Encounter (Signed)
CBC is a stable.  CMP shows decrease in GFR.  Patient is on diuretics.  Please have patient repeat BMP with GFR in 2 weeks after taking in fluids.

## 2019-06-01 ENCOUNTER — Ambulatory Visit: Payer: Medicare Other | Admitting: Rheumatology

## 2019-06-02 DIAGNOSIS — N3010 Interstitial cystitis (chronic) without hematuria: Secondary | ICD-10-CM | POA: Diagnosis not present

## 2019-06-02 DIAGNOSIS — R3914 Feeling of incomplete bladder emptying: Secondary | ICD-10-CM | POA: Diagnosis not present

## 2019-06-02 DIAGNOSIS — R3912 Poor urinary stream: Secondary | ICD-10-CM | POA: Diagnosis not present

## 2019-06-06 ENCOUNTER — Telehealth: Payer: Self-pay | Admitting: Rheumatology

## 2019-06-06 DIAGNOSIS — M810 Age-related osteoporosis without current pathological fracture: Secondary | ICD-10-CM

## 2019-06-06 MED ORDER — PROLIA 60 MG/ML ~~LOC~~ SOSY: PREFILLED_SYRINGE | SUBCUTANEOUS | 0 refills | Status: DC

## 2019-06-06 NOTE — Telephone Encounter (Signed)
Patient called requesting a return call to schedule her Prolia injection.  Patient states she had labwork on 05/30/19.    Patient also states she is scheduled to have her COVID vaccine on Saturday, 06/18/19 and is checking if she needs to discontinue taking her Methotrexate medication before or after the injection.

## 2019-06-06 NOTE — Telephone Encounter (Signed)
Spoke with patient and advised we have sent her prescription in for her Prolia. Patient schedule for her Prolia injection on 06/09/19 at 2:30 pm.   Advised patient that the COVID vaccine is not a live vaccine therefore she does not need to stop her methotrexate before or after getting the vaccine.

## 2019-06-07 MED FILL — PROLIA 60 MG/ML SOLN: 60 | 180 days supply | Qty: 1 | Fill #0

## 2019-06-08 ENCOUNTER — Other Ambulatory Visit: Payer: Self-pay | Admitting: Internal Medicine

## 2019-06-08 ENCOUNTER — Encounter: Payer: Self-pay | Admitting: Internal Medicine

## 2019-06-08 NOTE — Telephone Encounter (Signed)
Per chart MD increase to BID. Sent updated script to [facility]

## 2019-06-09 ENCOUNTER — Telehealth: Payer: Self-pay | Admitting: Rheumatology

## 2019-06-09 ENCOUNTER — Ambulatory Visit (INDEPENDENT_AMBULATORY_CARE_PROVIDER_SITE_OTHER): Payer: Medicare Other | Admitting: *Deleted

## 2019-06-09 ENCOUNTER — Other Ambulatory Visit: Payer: Self-pay

## 2019-06-09 DIAGNOSIS — M810 Age-related osteoporosis without current pathological fracture: Secondary | ICD-10-CM

## 2019-06-09 MED ORDER — DENOSUMAB 60 MG/ML ~~LOC~~ SOSY
60.0000 mg | PREFILLED_SYRINGE | Freq: Once | SUBCUTANEOUS | Status: AC
  Administered 2019-06-09: 60 mg via SUBCUTANEOUS

## 2019-06-09 NOTE — Progress Notes (Signed)
Patient in office for a Prolia injection. Patient was given injection in her left arm. Patient tolerated injection well. Patient was reminded she will be due for labs in 10 days.   Administrations This Visit    denosumab (PROLIA) injection 60 mg    Admin Date 06/09/2019 Action Given Dose 60 mg Route Subcutaneous Administered By Carole Binning, LPN

## 2019-06-09 NOTE — Patient Instructions (Signed)
Standing Labs We placed an order today for your standing lab work.    Please come back and get your standing labs in 10 days  We have open lab daily Monday through Thursday from 8:30-12:30 PM and 1:30-4:30 PM and Friday from 8:30-12:30 PM and 1:30-4:00 PM at the office of Dr. Bo Merino.   You may experience shorter wait times on Monday and Friday afternoons. The office is located at [address], [place], [place], [place] [address] No appointment is necessary.   Labs are drawn by [facility].  You may receive a bill from [facility] for your lab work.  If you wish to have your labs drawn at another location, please call the office 24 hours in advance to send orders.  If you have any questions regarding directions or hours of operation,  please call [phone].   Just as a reminder please drink plenty of water prior to coming for your lab work. Thanks!

## 2019-06-09 NOTE — Telephone Encounter (Signed)
Patient wanted to let you know she had increased dose of Omeprazole to 80 mg for several months now. Patient was reading that it could effect Kidney Function. Patient wanted to see if that could be the cause of her abnormal lab level? Please call to advise.

## 2019-06-10 ENCOUNTER — Encounter: Payer: Self-pay | Admitting: Internal Medicine

## 2019-06-10 NOTE — Telephone Encounter (Addendum)
Spoke with Hazel Sams, PA-C and omeprazole can effect methotrexate and kidney function. I advised patient and advised she call her PCP about switching to pepcid. patient states her daughter told her yesterday she needs to switch to Pepcid. Patient will call Dr. Eilleen Kempf office.

## 2019-06-12 MED ORDER — FAMOTIDINE 40 MG PO TABS: 40.0000 mg | ORAL_TABLET | Freq: Every day | ORAL | 1 refills | Status: DC

## 2019-06-15 ENCOUNTER — Other Ambulatory Visit: Payer: Self-pay | Admitting: Rheumatology

## 2019-06-15 MED FILL — OLUMIANT 2 MG TABS: 2 | 30 days supply | Qty: 30 | Fill #2

## 2019-06-15 NOTE — Telephone Encounter (Signed)
Last Visit: 12/27/18 Next Visit: 07/28/19 Labs: 05/30/19 CBC is a stable. CMP shows decrease in GFR.   Okay to refill per Dr. Estanislado Pandy

## 2019-06-16 DIAGNOSIS — D044 Carcinoma in situ of skin of scalp and neck: Secondary | ICD-10-CM | POA: Diagnosis not present

## 2019-06-18 ENCOUNTER — Ambulatory Visit: Payer: Medicare Other

## 2019-06-18 ENCOUNTER — Ambulatory Visit: Payer: Medicare Other | Attending: Internal Medicine

## 2019-06-18 DIAGNOSIS — Z23 Encounter for immunization: Secondary | ICD-10-CM | POA: Insufficient documentation

## 2019-06-18 NOTE — Progress Notes (Signed)
   Covid-19 Vaccination Clinic  Name:  [name]    MRN: [id number] DOB: 1941-01-31  06/18/2019  [name] was observed post Covid-19 immunization for 15 minutes without incidence. She was provided with Vaccine Information Sheet and instruction to access the V-Safe system.   [name] was instructed to call 911 with any severe reactions post vaccine: [name] Difficulty breathing  . Swelling of your face and throat  . A fast heartbeat  . A bad rash all over your body  . Dizziness and weakness    Immunizations Administered    Name Date Dose VIS Date Route   Pfizer COVID-19 Vaccine 06/18/2019  1:38 PM 0.3 mL 05/06/2019 Intramuscular   Manufacturer: [place]   Lot: [id number]   [place]: [id number]

## 2019-06-20 ENCOUNTER — Encounter: Payer: Self-pay | Admitting: Internal Medicine

## 2019-06-20 ENCOUNTER — Telehealth: Payer: Self-pay | Admitting: Rheumatology

## 2019-06-20 DIAGNOSIS — M0579 Rheumatoid arthritis with rheumatoid factor of multiple sites without organ or systems involvement: Secondary | ICD-10-CM | POA: Diagnosis not present

## 2019-06-20 DIAGNOSIS — Z79899 Other long term (current) drug therapy: Secondary | ICD-10-CM

## 2019-06-20 NOTE — Telephone Encounter (Signed)
Lab Orders released.  

## 2019-06-20 NOTE — Telephone Encounter (Signed)
Patient called requesting her labwork orders for her Prolia injection be sent to [facility] on [address].  Patient states she is going this morning.

## 2019-06-21 ENCOUNTER — Encounter: Payer: Self-pay | Admitting: Internal Medicine

## 2019-06-21 ENCOUNTER — Other Ambulatory Visit: Payer: Self-pay | Admitting: *Deleted

## 2019-06-21 LAB — CMP14+EGFR
ALT: 14 IU/L (ref 0–32)
AST: 30 IU/L (ref 0–40)
Albumin/Globulin Ratio: 1.9 (ref 1.2–2.2)
Albumin: 4.2 g/dL (ref 3.7–4.7)
Alkaline Phosphatase: 54 IU/L (ref 39–117)
BUN/Creatinine Ratio: 10 — ABNORMAL LOW (ref 12–28)
BUN: 10 mg/dL (ref 8–27)
Bilirubin Total: 0.3 mg/dL (ref 0.0–1.2)
CO2: 27 mmol/L (ref 20–29)
Calcium: 9.5 mg/dL (ref 8.7–10.3)
Chloride: 99 mmol/L (ref 96–106)
Creatinine, Ser: 1.01 mg/dL — ABNORMAL HIGH (ref 0.57–1.00)
GFR calc Af Amer: 62 mL/min/{1.73_m2} (ref >59)
GFR calc non Af Amer: 54 mL/min/{1.73_m2} — ABNORMAL LOW (ref >59)
Globulin, Total: 2.2 g/dL (ref 1.5–4.5)
Glucose: 102 mg/dL — ABNORMAL HIGH (ref 65–99)
Potassium: 4.9 mmol/L (ref 3.5–5.2)
Sodium: 139 mmol/L (ref 134–144)
Total Protein: 6.4 g/dL (ref 6.0–8.5)

## 2019-06-21 LAB — CBC WITH DIFFERENTIAL/PLATELET
Basophils Absolute: 0 10*3/uL (ref 0.0–0.2)
Basos: 1 %
EOS (ABSOLUTE): 0 10*3/uL (ref 0.0–0.4)
Eos: 0 %
Hematocrit: 38 % (ref 34.0–46.6)
Hemoglobin: 13 g/dL (ref 11.1–15.9)
Immature Grans (Abs): 0.1 10*3/uL (ref 0.0–0.1)
Immature Granulocytes: 2 %
Lymphocytes Absolute: 0.6 10*3/uL — ABNORMAL LOW (ref 0.7–3.1)
Lymphs: 15 %
MCH: 34.2 pg — ABNORMAL HIGH (ref 26.6–33.0)
MCHC: 34.2 g/dL (ref 31.5–35.7)
MCV: 100 fL — ABNORMAL HIGH (ref 79–97)
Monocytes Absolute: 0.4 10*3/uL (ref 0.1–0.9)
Monocytes: 9 %
Neutrophils Absolute: 3.2 10*3/uL (ref 1.4–7.0)
Neutrophils: 73 %
Platelets: 363 10*3/uL (ref 150–450)
RBC: 3.8 x10E6/uL (ref 3.77–5.28)
RDW: 13.4 % (ref 11.7–15.4)
WBC: 4.3 10*3/uL (ref 3.4–10.8)

## 2019-06-21 NOTE — Telephone Encounter (Signed)
MCV is elevated.  Please add B12 and folate levels if possible.  Creatinine is elevated most likely due to diuretic use.  Please forward labs to her PCP.

## 2019-06-23 LAB — B12 AND FOLATE PANEL
Folate: >20 ng/mL (ref >3.0)
Vitamin B-12: 789 pg/mL (ref 232–1245)

## 2019-06-23 LAB — SPECIMEN STATUS REPORT

## 2019-06-23 NOTE — Telephone Encounter (Signed)
CBC and CMP are stable.  GFR is low.  B12 and folate levels were normal.

## 2019-06-25 ENCOUNTER — Other Ambulatory Visit: Payer: Self-pay | Admitting: Rheumatology

## 2019-06-27 ENCOUNTER — Other Ambulatory Visit: Payer: Self-pay | Admitting: Rheumatology

## 2019-06-27 NOTE — Telephone Encounter (Signed)
Last Visit: 12/27/18 Next Visit: 07/28/19  Okay to refill per Dr. Estanislado Pandy

## 2019-06-28 ENCOUNTER — Other Ambulatory Visit: Payer: Self-pay

## 2019-06-28 ENCOUNTER — Ambulatory Visit (INDEPENDENT_AMBULATORY_CARE_PROVIDER_SITE_OTHER): Payer: Medicare Other | Admitting: Neurology

## 2019-06-28 ENCOUNTER — Encounter: Payer: Medicare Other | Admitting: Neurology

## 2019-06-28 ENCOUNTER — Telehealth: Payer: Self-pay | Admitting: Neurology

## 2019-06-28 ENCOUNTER — Other Ambulatory Visit: Payer: Self-pay | Admitting: Neurology

## 2019-06-28 DIAGNOSIS — G5603 Carpal tunnel syndrome, bilateral upper limbs: Secondary | ICD-10-CM | POA: Diagnosis not present

## 2019-06-28 DIAGNOSIS — M0579 Rheumatoid arthritis with rheumatoid factor of multiple sites without organ or systems involvement: Secondary | ICD-10-CM

## 2019-06-28 DIAGNOSIS — G35 Multiple sclerosis: Secondary | ICD-10-CM

## 2019-06-28 DIAGNOSIS — M5412 Radiculopathy, cervical region: Secondary | ICD-10-CM | POA: Diagnosis not present

## 2019-06-28 DIAGNOSIS — Z981 Arthrodesis status: Secondary | ICD-10-CM

## 2019-06-28 DIAGNOSIS — Z0289 Encounter for other administrative examinations: Secondary | ICD-10-CM

## 2019-06-28 MED ORDER — TRAMADOL HCL 50 MG PO TABS: ORAL_TABLET | ORAL | 5 refills | Status: DC

## 2019-06-28 MED ORDER — OXYCODONE HCL 10 MG PO TABS: ORAL_TABLET | ORAL | 0 refills | Status: DC

## 2019-06-28 NOTE — Progress Notes (Signed)
Full Name: Sherri [name] Gender: Female MRN #: [id number] Date of Birth: 1940-08-21    Visit Date: 06/28/2019 08:52 Age: 79 Years Examining Physician: Arlice Colt, MD  Referring Physician: Arlice Colt, MD     History: [name] is a 79 year old woman with multiple sclerosis, rheumatoid arthritis and pain in the right wrist and hand.  She also has a history of biceps tendon rupture on the right.  She has milder symptoms in the left wrist and hand.  The worst pain is at the base of the thumb near the wrist.  She has significant degenerative and inflammatory changes in her hands from rheumatoid arthritis.   Nerve conduction studies: Bilateral median and ulnar motor responses had normal distal latencies, amplitudes and conduction velocities.  Ulnar F-wave responses were normal.  Bilateral median and ulnar sensory responses had normal peak latencies and amplitudes.  Electromyography: Needle EMG of selected muscles of the right arm and hand was performed.  There was mild chronic elevation in the first dorsal interosseous muscle and some polyphasic motor units in the abductor pollicis brevis muscle.  Additionally, the abductor pollicis brevis and extensor indicis showed some polyphasic motor units though recruitment was normal.  None of the muscles tested had abnormal spontaneous activity.  Impression: This NCV/EMG study shows the following: 1.   Probable mild right C8 chronic radiculopathy. 2.   There was no evidence of median or ulnar neuropathy in the arms.  Renad Jenniges A. Felecia Shelling, MD, PhD, FAAN Certified in Neurology, Clinical Neurophysiology, Sleep Medicine, Pain Medicine and Neuroimaging Director, [place] [facility] [address] [place], [place] [address] [phone]  Clinical note: She appears to have a probable mild radiculopathy but that is unlikely to explain pain that is mostly in the  thumb region.  Most likely degenerative changes to to the rheumatoid arthritis are playing a role.  I will increase her tramadol to 3 pills a day. --ras      [place]    Nerve / Sites Muscle Latency Ref. Amplitude Ref. Rel Amp Segments Distance Velocity Ref. Area    ms ms mV mV %  cm m/s m/s mVms  R Median - APB     Wrist APB 3.5 ?4.4 5.0 ?4.0 100 Wrist - APB 7   20.0     Upper arm APB 6.9  5.0  99.5 Upper arm - Wrist 19 57 ?49 20.2  L Median - APB     Wrist APB 3.0 ?4.4 6.3 ?4.0 100 Wrist - APB 7   28.3     Upper arm APB 6.4  6.4  102 Upper arm - Wrist 18 54 ?49 28.8  R Ulnar - ADM     Wrist ADM 2.6 ?3.3 9.3 ?6.0 100 Wrist - ADM 7   27.8     B.Elbow ADM 5.4  8.8  94.2 B.Elbow - Wrist 17 59 ?49 27.7     A.Elbow ADM 7.2  8.3  93.9 A.Elbow - B.Elbow 10 57 ?49 26.6         A.Elbow - Wrist      L Ulnar - ADM     Wrist ADM 2.9 ?3.3 9.2 ?6.0 100 Wrist - ADM 7   23.9     B.Elbow ADM 5.8  8.6  94 B.Elbow - Wrist 18 63 ?49 23.7     A.Elbow ADM 7.3  8.3  96.1 A.Elbow - B.Elbow 10 65 ?49 24.5  A.Elbow - Wrist                 SNC    Nerve / Sites Rec. Site Peak Lat Ref.  Amp Ref. Segments Distance Peak Diff Ref.    ms ms V V  cm ms ms  R Median, Ulnar - Transcarpal comparison     Median Palm Wrist 2.1 ?2.2 86 ?35 Median Palm - Wrist 8       Ulnar Palm Wrist 2.0 ?2.2 24 ?12 Ulnar Palm - Wrist 8          Median Palm - Ulnar Palm  0.1 ?0.4  L Median, Ulnar - Transcarpal comparison     Median Palm Wrist 2.0 ?2.2 91 ?35 Median Palm - Wrist 8       Ulnar Palm Wrist 2.1 ?2.2 42 ?12 Ulnar Palm - Wrist 8          Median Palm - Ulnar Palm  -0.1 ?0.4  R Median - Orthodromic (Dig II, Mid palm)     Dig II Wrist 3.3 ?3.4 10 ?10 Dig II - Wrist 13    L Median - Orthodromic (Dig II, Mid palm)     Dig II Wrist 3.0 ?3.4 12 ?10 Dig II - Wrist 13    R Ulnar - Orthodromic, (Dig V, Mid palm)     Dig V Wrist 2.8 ?3.1 6 ?5 Dig V - Wrist 11    L Ulnar - Orthodromic, (Dig V, Mid palm)     Dig V Wrist 3.1  ?3.1 8 ?5 Dig V - Wrist 27                   F  Wave    Nerve F Lat Ref.   ms ms  R Ulnar - ADM 24.8 ?32.0  L Ulnar - ADM 26.4 ?32.0         EMG Summary Table    Spontaneous MUAP Recruitment  Muscle IA Fib PSW Fasc Other Amp Dur. Poly Pattern  R. Deltoid Normal None None None _______ Normal Normal Normal Normal  R. Triceps brachii Normal None None None _______ Normal Normal Normal Normal  R. Extensor digitorum communis Normal None None None _______ Normal Normal Normal Normal  R. First dorsal interosseous Normal None None None _______ Normal Normal 1+ Reduced  R. Abductor pollicis brevis Normal None None None _______ Normal Normal 1+ Normal  R. Extensor indicis proprius Normal None None None _______ Normal Normal 1+ Normal

## 2019-06-28 NOTE — Telephone Encounter (Signed)
Please let her know I fixe d the tramadol and sent in the oxycodone

## 2019-06-28 NOTE — Telephone Encounter (Signed)
Pt states she forgot to mention she needs a refill on her  Oxycodone HCl 10 MG TABS [id number]

## 2019-06-28 NOTE — Telephone Encounter (Signed)
Patient called in and stated that the script for Tramadol was sent in for her to take BID instead of TID and the pharmacy wont fill it until a updated script is sent in   [id number] [place], [place]

## 2019-06-29 NOTE — Telephone Encounter (Signed)
Called pt. Relayed Dr. Garth Bigness message. She verbalized understanding.

## 2019-07-08 ENCOUNTER — Other Ambulatory Visit: Payer: Self-pay | Admitting: Rheumatology

## 2019-07-08 DIAGNOSIS — M0579 Rheumatoid arthritis with rheumatoid factor of multiple sites without organ or systems involvement: Secondary | ICD-10-CM

## 2019-07-08 NOTE — Telephone Encounter (Signed)
Last Visit:04/26/2019 telemedicine  Next Visit: 07/28/2019 Labs: 06/20/2019 MCV is elevated. Creatinine is elevated most likely due to diuretic use. CBC and CMP are stable. GFR is low. B12 and folate levels were normal.  Okay to refill per Dr. Estanislado Pandy.

## 2019-07-09 ENCOUNTER — Encounter: Payer: Self-pay | Admitting: Internal Medicine

## 2019-07-09 ENCOUNTER — Ambulatory Visit: Payer: Medicare Other | Attending: Internal Medicine

## 2019-07-09 DIAGNOSIS — Z23 Encounter for immunization: Secondary | ICD-10-CM

## 2019-07-09 NOTE — Progress Notes (Signed)
   Covid-19 Vaccination Clinic  Name:  LEIHLA DESOUZA    MRN: [id number] DOB: 01/05/1941  07/09/2019  Ms. Moldenhauer was observed post Covid-19 immunization for 15 minutes without incidence. She was provided with Vaccine Information Sheet and instruction to access the V-Safe system.   Ms. Ishida was instructed to call 911 with any severe reactions post vaccine: [name] Difficulty breathing  . Swelling of your face and throat  . A fast heartbeat  . A bad rash all over your body  . Dizziness and weakness    Immunizations Administered    Name Date Dose VIS Date Route   Pfizer COVID-19 Vaccine 07/09/2019 12:35 PM 0.3 mL 05/06/2019 Intramuscular   Manufacturer: [place]   Lot: [id number]   [place]: [id number]

## 2019-07-12 MED FILL — OLUMIANT 2 MG TABS: 2 | 30 days supply | Qty: 30 | Fill #0

## 2019-07-22 ENCOUNTER — Other Ambulatory Visit: Payer: Self-pay | Admitting: Rheumatology

## 2019-07-22 ENCOUNTER — Telehealth: Payer: Self-pay | Admitting: Rheumatology

## 2019-07-22 MED ORDER — PREDNISONE 5 MG PO TABS: ORAL_TABLET | ORAL | 0 refills | Status: DC

## 2019-07-22 NOTE — Telephone Encounter (Signed)
Last Visit: 04/26/2019 Next Visit: 07/28/2019  Okay to refill per Dr. Estanislado Pandy.

## 2019-07-22 NOTE — Telephone Encounter (Signed)
Patient called stating she just [facility] Estanislado Pandy did not authorize a prescription refill of her Prednisone 5 mg tablets.  Patient is requesting a return call.

## 2019-07-24 ENCOUNTER — Other Ambulatory Visit: Payer: Self-pay | Admitting: Internal Medicine

## 2019-07-25 ENCOUNTER — Other Ambulatory Visit: Payer: Self-pay | Admitting: Rheumatology

## 2019-07-25 NOTE — Progress Notes (Signed)
Office Visit Note  Patient: Sherri [name]             Date of Birth: [birth year]           MRN: [id number]             PCP: Binnie Rail, MD Referring: Binnie Rail, MD Visit Date: 07/28/2019 Occupation: @GUAROCC @  Subjective:  Other (increased joint pain)   History of Present Illness: Sherri [name] is a 79 y.o. female with history of rheumatoid arthritis.  She states she continues to have pain and swelling in her hands and wrist joints.  She has discomfort in her elbows.  She has noticed decreased grip strength and increased deformities in her hands.  She has some discomfort in her knee joints, ankle joints and her feet without swelling.  She believes the combination therapy between Olumiant and methotrexate is not working.  She is a still taking prednisone 60 mg p.o. daily.  Activities of Daily Living:  Patient reports morning stiffness for 24 hours.   Patient Reports nocturnal pain.  Difficulty dressing/grooming: Reports Difficulty climbing stairs: Denies Difficulty getting out of chair: Denies Difficulty using hands for taps, buttons, cutlery, and/or writing: Reports  Review of Systems  Constitutional: Positive for fatigue.  HENT: Negative for mouth sores, mouth dryness and nose dryness.   Eyes: Negative for itching and dryness.  Respiratory: Negative for shortness of breath and difficulty breathing.   Cardiovascular: Negative for chest pain and palpitations.  Gastrointestinal: Negative for blood in stool, constipation and diarrhea.  Endocrine: Negative for increased urination.  Genitourinary: Negative for painful urination.  Musculoskeletal: Positive for arthralgias, joint pain, joint swelling and morning stiffness.  Skin: Negative for rash.  Allergic/Immunologic: Negative for susceptible to infections.  Neurological: Positive for weakness. Negative for dizziness, headaches and memory loss.  Hematological: Positive for bruising/bleeding tendency.    Psychiatric/Behavioral: Negative for confusion.    PMFS History:  Patient Active Problem List   Diagnosis Date Noted  . Cervical radiculopathy 06/28/2019  . Carpal tunnel syndrome on both sides 06/28/2019  . Right sided sciatica 04/14/2018  . Trochanteric bursitis of right hip 04/14/2018  . S/P cervical spinal fusion 11/10/2017  . Swelling of left foot 10/26/2017  . Dysphagia 10/26/2017  . Left ankle swelling 10/26/2017  . Localized swelling, mass, and lump of head 08/18/2017  . Gastroesophageal reflux disease 08/06/2017  . Easy bruising 08/06/2017  . Anxiety 04/24/2017  . Mid back pain 03/26/2017  . Left sided chest pain 03/25/2017  . Dyspnea on exertion 03/14/2017  . Hyperkalemia 03/13/2017  . Submandibular gland swelling 01/08/2017  . High risk medication use 09/02/2016  . Urinary frequency 09/02/2016  . Piriformis muscle pain 09/02/2016  . Fatigue 07/22/2016  . Hair loss 07/22/2016  . Right hip pain 04/08/2016  . Right knee pain 02/25/2016  . Dyspepsia 04/05/2015  . Numbness 11/14/2014  . Biceps tendon tear 10/10/2014  . Insomnia 10/10/2014  . Gait disturbance 06/13/2014  . 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 OP (osteoporosis) 01/18/2014  . Other long term (current) drug therapy 01/18/2014  . Other dysphagia 12/29/2013  . Hoarseness 12/29/2013  . 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 Personal history of colonic polyps 07/03/2010  . Nocturia 05/28/2010  . THYROID NODULE 05/17/2008  . Multiple sclerosis ([place]) 05/17/2008  . Chronic fatigue 03/30/2007  . Hyperlipidemia 08/20/2006  . Essential hypertension 08/20/2006  . Rheumatoid arthritis ([place]) 08/20/2006  . CERVICAL CANCER, HX OF 08/20/2006  .  GASTRIC ULCER, HX OF 08/20/2006    Past Medical History:  Diagnosis Date  . Anxiety    takes Valium daily as needed  . Bruises easily    d/t meds  . Cancer ([place])    basal cell ca, in situ- uterine   . Cataracts, bilateral   . Chronic back pain    . Dizziness    r/t to meds  . GERD (gastroesophageal reflux disease)    no meds on a regular basis but will take Tums if needed  . Headache(784.0)    r/t neck issues  . History of bronchitis 6-71yrs ago  . History of colon polyps    benign  . Hyperlipidemia    takes Atorvastatin on Mondays and Fridays  . Hypertension    takes Ramipril daily  . Joint pain   . Joint swelling   . Multiple sclerosis ([place])   . Neuromuscular disorder ([place])    Dr. Park Liter- [facility], follows [facility]  . Nocturia   . OA (osteoarthritis)    Dr Jani Gravel weekly, RA- hands- knees- feet   . PONV (postoperative nausea and vomiting)    trouble urinating after surgery in 2014  . Rheumatoid arthritis(714.0)    Dr Estanislado Pandy takes [name] daily  . Right wrist fracture   . Skin cancer   . Urinary retention    sees Dr.Wrenn about 2 times a yr    Family History  Problem Relation Age of Onset  . Cancer Mother        pancreatic  . Diabetes Mother   . Heart disease Father        Rheumatic  . Cancer Father        ? stomach  . Cancer Sister        stomach  . Cancer Maternal Aunt        X 4; ? primary  . Heart disease Paternal Aunt   . Cancer Maternal Grandmother        cervical  . Colon cancer Other        Aunts  . Multiple sclerosis Daughter   . Hypertension Neg Hx   . Stroke Neg Hx    Past Surgical History:  Procedure Laterality Date  . ABDOMINAL HYSTERECTOMY     1985  . APPENDECTOMY     with TAH  . BACK SURGERY     several  . BREAST BIOPSY Right    Results were negative  . BROW LIFT Bilateral 10/02/2016   Procedure: BILATERAL LOWER LID BLEPHAROPLASTY;  Surgeon: Clista Bernhardt, MD;  Location: [facility];  Service: Plastics;  Laterality: Bilateral;  . CARPAL TUNNEL RELEASE Right   . cataracts    . CERVICAL FUSION     Dr Joya Salm  . CERVICAL FUSION  12/31/2012   Dr Joya Salm  . CHEST TUBE INSERTION     for traumatic Pneumothorax  . COLONOSCOPY  07/16/2010   normal   .  COLONOSCOPY W/ POLYPECTOMY  1997   negative since; Dr Deatra Ina  . ESOPHAGOGASTRODUODENOSCOPY  07/16/2010   normal  . eye lid raise    . EYE SURGERY Bilateral    cataracts removed - /w IOL  . JOINT REPLACEMENT    . LUMBAR FUSION     Dr Joya Salm  . NASAL SINUS SURGERY    . POSTERIOR CERVICAL FUSION/FORAMINOTOMY N/A 12/31/2012   Procedure: POSTERIOR LATERAL CERVICAL FUSION/FORAMINOTOMY LEVEL 1 CERVICAL THREE-FOUR WITH LATERAL MASS SCREWS;  Surgeon: Floyce Stakes, MD;  Location: [facility];  Service: Neurosurgery;  Laterality: N/A;  . PTOSIS REPAIR Bilateral 10/02/2016   Procedure: INTERNAL PTOSIS REPAIR;  Surgeon: Clista Bernhardt, MD;  Location: [facility];  Service: Plastics;  Laterality: Bilateral;  . SKIN BIOPSY    . THYROID SURGERY     R lobe removed- 1972, has grown back - CT last done- 2018  . TOTAL HIP ARTHROPLASTY  12/22/2011   Procedure: TOTAL HIP ARTHROPLASTY;  Surgeon: Kerin Salen, MD;  Location: [facility];  Service: Orthopedics;  Laterality: Left;  . TOTAL SHOULDER ARTHROPLASTY    . TUBAL LIGATION     Social History   Social History Narrative  . Not on file   Immunization History  Administered Date(s) Administered  . Influenza Whole 02/22/2007, 02/08/2013  . Influenza, High Dose Seasonal PF 02/04/2015, 02/18/2017, 02/21/2018, 01/16/2019  . Influenza-Unspecified 01/24/2014, 01/25/2016, 02/18/2017, 01/16/2019  . PFIZER SARS-COV-2 Vaccination 06/18/2019, 07/09/2019  . Pneumococcal Conjugate-13 11/23/2012, 09/08/2014  . Pneumococcal Polysaccharide-23 05/26/2006  . Pneumococcal-Unspecified 12/28/2012  . Td 05/28/2010  . Zoster 05/26/2009  . Zoster Recombinat (Shingrix) 02/10/2019, 04/14/2019     Objective: Vital Signs: BP 132/73 (BP Location: Left Arm, Patient Position: Sitting, Cuff Size: Normal)   Pulse 62   Resp 13   Ht 5' 2.5" (1.588 m)   Wt 118 lb (53.5 kg)   BMI 21.24 kg/m    Physical Exam Vitals and nursing note reviewed.  Constitutional:      Appearance: She  is well-developed.  HENT:     Head: Normocephalic and atraumatic.  Eyes:     Conjunctiva/sclera: Conjunctivae normal.  Cardiovascular:     Rate and Rhythm: Normal rate and regular rhythm.     Heart sounds: Normal heart sounds.  Pulmonary:     Effort: Pulmonary effort is normal.     Breath sounds: Normal breath sounds.  Abdominal:     General: Bowel sounds are normal.     Palpations: Abdomen is soft.  Musculoskeletal:     Cervical back: Normal range of motion.  Lymphadenopathy:     Cervical: No cervical adenopathy.  Skin:    General: Skin is warm and dry.     Capillary Refill: Capillary refill takes less than 2 seconds.  Neurological:     Mental Status: She is alert and oriented to person, place, and time.  Psychiatric:        Behavior: Behavior normal.      Musculoskeletal Exam: C-spine was in limited range of motion.  Thoracic and lumbar spine were in limited  range of motion.  Shoulder joints elbow joints with good range of motion.  She has tenderness on palpation of her wrist joints with no synovitis.  Synovitis was noted over MCP joints as described below.  She has DIP and PIP thickening due to underlying osteoarthritis.  She has good range of motion of bilateral hip joints and knee joints.  Some warmth was noted on palpation of her right knee joint.  There was no swelling over ankle joints.  No tenderness over MTPs.  CDAI Exam: CDAI Score: 10.2  Patient Global: 6 mm; Provider Global: 6 mm Swollen: 5 ; Tender: 4  Joint Exam 07/28/2019      Right  Left  MCP 2  Swollen Tender  Swollen Tender  MCP 3  Swollen Tender  Swollen Tender  Knee  Swollen         Investigation: No additional findings.  Imaging: No results found.  Recent Labs: Lab Results  Component Value Date  WBC 4.3 06/20/2019   HGB 13.0 06/20/2019   PLT 363 06/20/2019   NA 139 06/20/2019   K 4.9 06/20/2019   CL 99 06/20/2019   CO2 27 06/20/2019   GLUCOSE 102 (H) 06/20/2019   BUN 10 06/20/2019    CREATININE 1.01 (H) 06/20/2019   BILITOT 0.3 06/20/2019   ALKPHOS 54 06/20/2019   AST 30 06/20/2019   ALT 14 06/20/2019   PROT 6.4 06/20/2019   ALBUMIN 4.2 06/20/2019   CALCIUM 9.5 06/20/2019   GFRAA 62 06/20/2019   QFTBGOLDPLUS NEGATIVE 11/03/2018    Speciality Comments: Prior therapy includes: [name] (cost), [name] (cost)  Procedures:  No procedures performed Allergies: Darvon [propoxyphene hcl], Meperidine hcl, Penicillins, Azathioprine, Leflunomide, and Ezetimibe-simvastatin   Assessment / Plan:     Visit Diagnoses: Rheumatoid arthritis involving multiple sites with positive rheumatoid factor (HCC)-patient has severe rheumatoid arthritis with ongoing synovitis.  She has tried multiple medications and failed most of the medications in the past.  She is currently on Olumiant and methotrexate combination with prednisone.  She has reduced prednisone to 6 mg p.o. daily.  She has been experiencing increased pain and swelling.  She had the best response to [name].  But it is not covered by her insurance.  We discussed adding Plaquenil but patient has tried in the past and did not do well.  After discussing different options we decided to increase prednisone to 8 mg p.o. daily.  When her arthritis improves she will reduce it back to 6 mg p.o. daily.  She was in agreement with the current plan.  High risk medication use - Olumiant 2 mg 1 tablet daily (started in October 2019), methotrexate 2.5 mg 6 tablets every 7 days, folic acid 1 mg 2 tablets daily, and prednisone 6 mg daily.  Her labs have been stable.  We will continue to monitor labs every 3 months.- Plan: QuantiFERON-TB Gold Plus with next lab.  Age-related osteoporosis without current pathological fracture - She is on Prolia 60 mg every 6 months. DEXA on 01/11/2018 showed T sco-3.9 at radius with BMD of 0.457.  Negative 7.58% change in BMD from previous DEXA.  She will need bone density this year.  Primary osteoarthritis of right hip-she  had fairly good range of motion.  History of total hip replacement, left-she had good range of motion.  Closed fracture of right wrist with routine healing, subsequent encounter-she has chronic pain in her right wrist joint.  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-some limitation of range of motion was noted.  DDD (degenerative disc disease), lumbar-she is currently not having discomfort.  Other fatigue-she has chronic fatigue due to multiple medical problems.  Other medical problems are listed as follows:  History of multiple sclerosis ([place])  History of hyperlipidemia  Primary insomnia  History of depression  History of anxiety  History of hypertension  Orders: Orders Placed This Encounter  Procedures  . QuantiFERON-TB Gold Plus   No orders of the defined types were placed in this encounter.     Follow-Up Instructions: Return in about 5 months (around 12/28/2019) for Rheumatoid arthritis.   Bo Merino, MD  Note - This record has been created using Editor, commissioning.  Chart creation errors have been sought, but may not always  have been located. Such creation errors do not reflect on  the standard of medical care.

## 2019-07-25 NOTE — Telephone Encounter (Signed)
Last Visit:04/26/2019 telemedicine  Next Visit: 07/28/2019  Okay to refill per Dr. Estanislado Pandy

## 2019-07-28 ENCOUNTER — Ambulatory Visit: Payer: Medicare Other | Admitting: Rheumatology

## 2019-07-28 ENCOUNTER — Other Ambulatory Visit: Payer: Self-pay | Admitting: Neurology

## 2019-07-28 ENCOUNTER — Encounter: Payer: Self-pay | Admitting: Rheumatology

## 2019-07-28 ENCOUNTER — Other Ambulatory Visit: Payer: Self-pay

## 2019-07-28 VITALS — BP 132/73 | HR 62 | Resp 13 | Ht 62.5 in | Wt 118.0 lb

## 2019-07-28 DIAGNOSIS — M810 Age-related osteoporosis without current pathological fracture: Secondary | ICD-10-CM

## 2019-07-28 DIAGNOSIS — Z96642 Presence of left artificial hip joint: Secondary | ICD-10-CM

## 2019-07-28 DIAGNOSIS — Z79899 Other long term (current) drug therapy: Secondary | ICD-10-CM | POA: Diagnosis not present

## 2019-07-28 DIAGNOSIS — R5383 Other fatigue: Secondary | ICD-10-CM

## 2019-07-28 DIAGNOSIS — M5136 Other intervertebral disc degeneration, lumbar region: Secondary | ICD-10-CM

## 2019-07-28 DIAGNOSIS — Z8659 Personal history of other mental and behavioral disorders: Secondary | ICD-10-CM

## 2019-07-28 DIAGNOSIS — Z8639 Personal history of other endocrine, nutritional and metabolic disease: Secondary | ICD-10-CM

## 2019-07-28 DIAGNOSIS — M1611 Unilateral primary osteoarthritis, right hip: Secondary | ICD-10-CM | POA: Diagnosis not present

## 2019-07-28 DIAGNOSIS — M0579 Rheumatoid arthritis with rheumatoid factor of multiple sites without organ or systems involvement: Secondary | ICD-10-CM | POA: Diagnosis not present

## 2019-07-28 DIAGNOSIS — G35 Multiple sclerosis: Secondary | ICD-10-CM

## 2019-07-28 DIAGNOSIS — Z8679 Personal history of other diseases of the circulatory system: Secondary | ICD-10-CM

## 2019-07-28 DIAGNOSIS — S62101D Fracture of unspecified carpal bone, right wrist, subsequent encounter for fracture with routine healing: Secondary | ICD-10-CM

## 2019-07-28 DIAGNOSIS — F5101 Primary insomnia: Secondary | ICD-10-CM

## 2019-07-28 DIAGNOSIS — M4312 Spondylolisthesis, cervical region: Secondary | ICD-10-CM

## 2019-07-28 MED ORDER — OXYCODONE HCL 10 MG PO TABS: ORAL_TABLET | ORAL | 0 refills | Status: DC

## 2019-07-28 NOTE — Telephone Encounter (Signed)
1) Medication(s) Requested (by name): Oxycodone HCl 10 MG TABS   2) [id number] [place], [address]  [place], [place] [address]

## 2019-07-28 NOTE — Patient Instructions (Signed)
Standing Labs We placed an order today for your standing lab work.    Please come back and get your standing labs in April and every 3 months   We have open lab daily Monday through Thursday from 8:30-12:30 PM and 1:30-4:30 PM and Friday from 8:30-12:30 PM and 1:30-4:00 PM at the office of Dr. Haevyn Ury.   You may experience shorter wait times on Monday and Friday afternoons. The office is located at [address], [address], [place], [place] [address] No appointment is necessary.   Labs are drawn by [facility].  You may receive a bill from [facility] for your lab work.  If you wish to have your labs drawn at another location, please call the office 24 hours in advance to send orders.  If you have any questions regarding directions or hours of operation,  please call [phone].   Just as a reminder please drink plenty of water prior to coming for your lab work. Thanks!   

## 2019-08-03 ENCOUNTER — Other Ambulatory Visit: Payer: Self-pay

## 2019-08-03 MED ORDER — AMLODIPINE BESYLATE 2.5 MG PO TABS: 2.5000 mg | ORAL_TABLET | Freq: Every day | ORAL | 0 refills | Status: DC

## 2019-08-04 DIAGNOSIS — H5213 Myopia, bilateral: Secondary | ICD-10-CM | POA: Diagnosis not present

## 2019-08-04 DIAGNOSIS — Z961 Presence of intraocular lens: Secondary | ICD-10-CM | POA: Diagnosis not present

## 2019-08-04 DIAGNOSIS — H04123 Dry eye syndrome of bilateral lacrimal glands: Secondary | ICD-10-CM | POA: Diagnosis not present

## 2019-08-09 MED FILL — OLUMIANT 2 MG TABS: 2 | 30 days supply | Qty: 30 | Fill #1

## 2019-08-16 ENCOUNTER — Other Ambulatory Visit: Payer: Self-pay | Admitting: Rheumatology

## 2019-08-16 NOTE — Telephone Encounter (Signed)
Last Visit: 07/28/19 Next Visit: 12/28/19  Current dose per office note on 07/28/19: prednisone to 8 mg p.o. daily.

## 2019-08-20 ENCOUNTER — Other Ambulatory Visit: Payer: Self-pay | Admitting: Internal Medicine

## 2019-08-22 ENCOUNTER — Encounter: Payer: Self-pay | Admitting: Internal Medicine

## 2019-08-26 ENCOUNTER — Other Ambulatory Visit: Payer: Self-pay | Admitting: Neurology

## 2019-08-26 NOTE — Telephone Encounter (Signed)
Pt is needing a refill on her Oxycodone HCl 10 MG TABS [facility] [address].

## 2019-08-29 ENCOUNTER — Other Ambulatory Visit: Payer: Self-pay

## 2019-08-29 ENCOUNTER — Telehealth: Payer: Self-pay | Admitting: Neurology

## 2019-08-29 ENCOUNTER — Ambulatory Visit: Payer: Self-pay

## 2019-08-29 ENCOUNTER — Encounter: Payer: Self-pay | Admitting: Orthopedic Surgery

## 2019-08-29 ENCOUNTER — Ambulatory Visit: Payer: Medicare Other | Admitting: Orthopedic Surgery

## 2019-08-29 VITALS — Ht 62.0 in | Wt 118.0 lb

## 2019-08-29 DIAGNOSIS — G5762 Lesion of plantar nerve, left lower limb: Secondary | ICD-10-CM

## 2019-08-29 DIAGNOSIS — M79672 Pain in left foot: Secondary | ICD-10-CM

## 2019-08-29 MED ORDER — LIDOCAINE HCL (PF) 1 % IJ SOLN
1.0000 mL | INTRAMUSCULAR | Status: AC | PRN
  Administered 2019-08-29: 1 mL

## 2019-08-29 MED ORDER — METHYLPREDNISOLONE ACETATE 40 MG/ML IJ SUSP
40.0000 mg | INTRAMUSCULAR | Status: AC | PRN
  Administered 2019-08-29: 40 mg via INTRA_ARTICULAR

## 2019-08-29 MED ORDER — LIDOCAINE HCL 1 % IJ SOLN
1.0000 mL | INTRAMUSCULAR | Status: AC | PRN
  Administered 2019-08-29: 1 mL

## 2019-08-29 MED ORDER — OXYCODONE HCL 10 MG PO TABS: ORAL_TABLET | ORAL | 0 refills | Status: DC

## 2019-08-29 NOTE — Telephone Encounter (Signed)
Refill already sent to MD to send in electronically

## 2019-08-29 NOTE — Telephone Encounter (Signed)
Pt called needing a refill on her Oxycodone HCl 10 MG TABS [facility] [address].

## 2019-08-29 NOTE — Progress Notes (Signed)
Office Visit Note   Patient: Sherri [name]           Date of Birth: [birth year]           MRN: [id number] Visit Date: 08/29/2019              Requested by: Binnie Rail, MD [place],  [place] [address] PCP: Binnie Rail, MD  Chief Complaint  Patient presents with  . Left Foot - Pain      HPI: Patient is a 79 year old woman who presents for evaluation of her left foot with burning pain over the dorsal lateral aspect of the left foot that radiates proximally.  She has pain with tight shoewear pain with weightbearing.  Reports a history of rheumatoid arthritis as well as multiple sclerosis.  Assessment & Plan: Visit Diagnoses:  1. Pain in left foot   2. Morton neuroma, left     Plan: She will wear her widest sneakers the neuroma was injected without complications reevaluate in 4 weeks.  Follow-Up Instructions: Return in about 4 weeks (around 09/26/2019).   Ortho Exam  Patient is alert, oriented, no adenopathy, well-dressed, normal affect, normal respiratory effort. Examination patient does not have a strong dorsalis pedis pulse she has good ankle good subtalar motion there is no Achilles contracture.  Lisfranc complex is nontender to palpation metatarsal heads are nontender to palpation she is point tender to palpation over the third webspace and this reproduces her symptoms.  Lateral compression of the metatarsal heads reproduces a clunk and this also reproduces her neuroma symptoms.  Imaging: XR Foot 2 Views Left  Result Date: 08/29/2019 2 view radiographs of the left foot shows peripheral vascular disease with calcification of the arteries down to the ankle.  There is decreased bone mineral density within the foot no fracture no dislocations.  No images are attached to the encounter.  Labs: Lab Results  Component Value Date   HGBA1C 5.6 10/24/2013   HGBA1C 5.2 11/11/2011   HGBA1C 5.6 05/17/2008   ESRSEDRATE 51 (H) 05/02/2019   ESRSEDRATE 30  07/22/2016   CRP 0.3 (L) 07/22/2016   REPTSTATUS 10/26/2014 FINAL 10/20/2014   CULT  10/20/2014    NO GROWTH 5 DAYS Performed at [facility]  06/05/2016    Three or more organisms present,each greater than 10,000 CFU/mL.These organisms,commonly found on external and internal genitalia,are considered to be colonizers.No further testing performed.      Lab Results  Component Value Date   ALBUMIN 4.2 06/20/2019   ALBUMIN 4.4 05/30/2019   ALBUMIN 4.3 10/26/2017    No results found for: MG Lab Results  Component Value Date   VD25OH 57 08/31/2017    No results found for: PREALBUMIN CBC EXTENDED Latest Ref Rng & Units 06/20/2019 05/30/2019 05/02/2019  WBC 3.4 - 10.8 x10E3/uL 4.3 4.5 4.6  RBC 3.77 - 5.28 x10E6/uL 3.80 3.53(L) 3.65(L)  HGB 11.1 - 15.9 g/dL 13.0 12.2 12.3  HCT 34.0 - 46.6 % 38.0 34.6 36.1  PLT 150 - 450 x10E3/uL 363 352 384  NEUTROABS 1.4 - 7.0 x10E3/uL 3.2 3.2 3,611  LYMPHSABS 0.7 - 3.1 x10E3/uL 0.6(L) 0.7 672(L)     Body mass index is 21.58 kg/m.  Orders:  Orders Placed This Encounter  Procedures  . XR Foot 2 Views Left   No orders of the defined types were placed in this encounter.    Procedures: Small Joint Inj: L intertarsal on 08/29/2019 10:00 AM  Indications: pain and diagnostic evaluation Details: 22 G needle, dorsal approach  Spinal Needle: No  Medications: 1 mL lidocaine 1 %; 40 mg methylPREDNISolone acetate 40 MG/ML; 1 mL lidocaine (PF) 1 % Outcome: tolerated well, no immediate complications Procedure, treatment alternatives, risks and benefits explained, specific risks discussed. Consent was given by the patient. Immediately prior to procedure a time out was called to verify the correct patient, procedure, equipment, support staff and site/side marked as required. Patient was prepped and draped in the usual sterile fashion.      Clinical Data: No additional findings.  ROS:  All other systems negative, except as  noted in the HPI. Review of Systems  Objective: Vital Signs: Ht 5\' 2"  (1.575 m)   Wt 118 lb (53.5 kg)   BMI 21.58 kg/m   Specialty Comments:  No specialty comments available.  PMFS History: Patient Active Problem List   Diagnosis Date Noted  . Cervical radiculopathy 06/28/2019  . Carpal tunnel syndrome on both sides 06/28/2019  . Right sided sciatica 04/14/2018  . Trochanteric bursitis of right hip 04/14/2018  . S/P cervical spinal fusion 11/10/2017  . Swelling of left foot 10/26/2017  . Dysphagia 10/26/2017  . Left ankle swelling 10/26/2017  . Localized swelling, mass, and lump of head 08/18/2017  . Gastroesophageal reflux disease 08/06/2017  . Easy bruising 08/06/2017  . Anxiety 04/24/2017  . Mid back pain 03/26/2017  . Left sided chest pain 03/25/2017  . Dyspnea on exertion 03/14/2017  . Hyperkalemia 03/13/2017  . Submandibular gland swelling 01/08/2017  . High risk medication use 09/02/2016  . Urinary frequency 09/02/2016  . Piriformis muscle pain 09/02/2016  . Fatigue 07/22/2016  . Hair loss 07/22/2016  . Right hip pain 04/08/2016  . Right knee pain 02/25/2016  . Dyspepsia 04/05/2015  . Numbness 11/14/2014  . Biceps tendon tear 10/10/2014  . Insomnia 10/10/2014  . Gait disturbance 06/13/2014  . 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 OP (osteoporosis) 01/18/2014  . Other long term (current) drug therapy 01/18/2014  . Other dysphagia 12/29/2013  . Hoarseness 12/29/2013  . 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 Personal history of colonic polyps 07/03/2010  . Nocturia 05/28/2010  . THYROID NODULE 05/17/2008  . Multiple sclerosis ([place]) 05/17/2008  . Chronic fatigue 03/30/2007  . Hyperlipidemia 08/20/2006  . Essential hypertension 08/20/2006  . Rheumatoid arthritis ([place]) 08/20/2006  . CERVICAL CANCER, HX OF 08/20/2006  . GASTRIC ULCER, HX OF 08/20/2006   Past Medical History:  Diagnosis Date  . Anxiety    takes Valium daily as needed  .  Bruises easily    d/t meds  . Cancer ([place])    basal cell ca, in situ- uterine   . Cataracts, bilateral   . Chronic back pain   . Dizziness    r/t to meds  . GERD (gastroesophageal reflux disease)    no meds on a regular basis but will take Tums if needed  . Headache(784.0)    r/t neck issues  . History of bronchitis 6-39yrs ago  . History of colon polyps    benign  . Hyperlipidemia    takes Atorvastatin on Mondays and Fridays  . Hypertension    takes Ramipril daily  . Joint pain   . Joint swelling   . Multiple sclerosis ([place])   . Neuromuscular disorder ([place])    Dr. Park Liter- [facility], follows M.S.  . Nocturia   . OA (osteoarthritis)    Dr Jani Gravel weekly, RA- hands- knees-  feet   . PONV (postoperative nausea and vomiting)    trouble urinating after surgery in 2014  . Rheumatoid arthritis(714.0)    Dr Estanislado Pandy takes [name] daily  . Right wrist fracture   . Skin cancer   . Urinary retention    sees Dr.Wrenn about 2 times a yr    Family History  Problem Relation Age of Onset  . Cancer Mother        pancreatic  . Diabetes Mother   . Heart disease Father        Rheumatic  . Cancer Father        ? stomach  . Cancer Sister        stomach  . Cancer Maternal Aunt        X 4; ? primary  . Heart disease Paternal Aunt   . Cancer Maternal Grandmother        cervical  . Colon cancer Other        Aunts  . Multiple sclerosis Daughter   . Hypertension Neg Hx   . Stroke Neg Hx     Past Surgical History:  Procedure Laterality Date  . ABDOMINAL HYSTERECTOMY     1985  . APPENDECTOMY     with TAH  . BACK SURGERY     several  . BREAST BIOPSY Right    Results were negative  . BROW LIFT Bilateral 10/02/2016   Procedure: BILATERAL LOWER LID BLEPHAROPLASTY;  Surgeon: Clista Bernhardt, MD;  Location: [facility];  Service: Plastics;  Laterality: Bilateral;  . CARPAL TUNNEL RELEASE Right   . cataracts    . CERVICAL FUSION     Dr Joya Salm  . CERVICAL FUSION   12/31/2012   Dr Joya Salm  . CHEST TUBE INSERTION     for traumatic Pneumothorax  . COLONOSCOPY  07/16/2010   normal   . COLONOSCOPY W/ POLYPECTOMY  1997   negative since; Dr Deatra Ina  . ESOPHAGOGASTRODUODENOSCOPY  07/16/2010   normal  . eye lid raise    . EYE SURGERY Bilateral    cataracts removed - /w IOL  . JOINT REPLACEMENT    . LUMBAR FUSION     Dr Joya Salm  . NASAL SINUS SURGERY    . POSTERIOR CERVICAL FUSION/FORAMINOTOMY N/A 12/31/2012   Procedure: POSTERIOR LATERAL CERVICAL FUSION/FORAMINOTOMY LEVEL 1 CERVICAL THREE-FOUR WITH LATERAL MASS SCREWS;  Surgeon: Floyce Stakes, MD;  Location: [facility];  Service: Neurosurgery;  Laterality: N/A;  . PTOSIS REPAIR Bilateral 10/02/2016   Procedure: INTERNAL PTOSIS REPAIR;  Surgeon: Clista Bernhardt, MD;  Location: [facility];  Service: Plastics;  Laterality: Bilateral;  . SKIN BIOPSY    . THYROID SURGERY     R lobe removed- 1972, has grown back - CT last done- 2018  . TOTAL HIP ARTHROPLASTY  12/22/2011   Procedure: TOTAL HIP ARTHROPLASTY;  Surgeon: Kerin Salen, MD;  Location: [facility];  Service: Orthopedics;  Laterality: Left;  . TOTAL SHOULDER ARTHROPLASTY    . TUBAL LIGATION     Social History   Occupational History  . Occupation: retired  Tobacco Use  . Smoking status: Former Smoker    Packs/day: 2.50    Years: 20.00    Pack years: 50.00    Types: Cigarettes    Quit date: 05/26/1981    Years since quitting: 38.2  . Smokeless tobacco: Never Used  . Tobacco comment: Smoked [phone], up to 2.5 ppd  Substance and Sexual Activity  . Alcohol use: Yes  Alcohol/week: 14.0 standard drinks    Types: 14 Cans of beer per week    Comment: 2% beer  . Drug use: Never  . Sexual activity: Yes

## 2019-09-01 ENCOUNTER — Ambulatory Visit: Payer: Medicare Other | Admitting: Physician Assistant

## 2019-09-01 ENCOUNTER — Encounter: Payer: Self-pay | Admitting: Physician Assistant

## 2019-09-01 ENCOUNTER — Ambulatory Visit: Payer: Self-pay

## 2019-09-01 ENCOUNTER — Other Ambulatory Visit: Payer: Self-pay

## 2019-09-01 VITALS — Ht 62.0 in | Wt 118.0 lb

## 2019-09-01 DIAGNOSIS — M79671 Pain in right foot: Secondary | ICD-10-CM | POA: Diagnosis not present

## 2019-09-01 NOTE — Progress Notes (Signed)
Office Visit Note   Patient: Sherri [name]           Date of Birth: [birth year]           MRN: [id number] Visit Date: 09/01/2019              Requested by: Binnie Rail, MD [place],  [place] [address] PCP: Binnie Rail, MD  Chief Complaint  Patient presents with  . Right Foot - Pain      HPI: This is a pleasant woman who is 3 days status post injection of third interspace neuroma on her left foot.  She does think she got some relief from this.  However now she is having pain in her right foot.  She denies any injuries.  She is not sure if she has been favoring the right foot.  She has no previous history of problems with this foot.  Assessment & Plan: Visit Diagnoses:  1. Pain in right foot     Plan:Findings consistent with metatarsalgia could be compensating from using her right foot since her injection.  She should continue to wear her supportive shoes.  I have placed a metatarsal pad in her shoe she may remove this if it is painful.  I told her to allow about a week to see if some of this resolves as her gait returns to normal.  She should follow-up if it does not improve also showed her some heel cord stretching exercises  Follow-Up Instructions: No follow-ups on file.   Ortho Exam  Patient is alert, oriented, no adenopathy, well-dressed, normal affect, normal respiratory effort Right foot she has significant varicosities pulses are intact.  She has no swelling no erythema no redness.  She does seem to be tender beneath the second and third metatarsal heads and mildly in the webspace.  I could not obtain a Mulder's click.  She felt it was over the bones more than the space.  Cannot completely rule out a neuroma.  No tenderness with manipulation of her midfoot Imaging: XR Foot Complete Right  Result Date: 09/01/2019 X-rays of her foot complete were reviewed today.  There is no soft tissue swelling no acute osseous injury she does have calcified vessels  going into her foot  No images are attached to the encounter.  Labs: Lab Results  Component Value Date   HGBA1C 5.6 10/24/2013   HGBA1C 5.2 11/11/2011   HGBA1C 5.6 05/17/2008   ESRSEDRATE 51 (H) 05/02/2019   ESRSEDRATE 30 07/22/2016   CRP 0.3 (L) 07/22/2016   REPTSTATUS 10/26/2014 FINAL 10/20/2014   CULT  10/20/2014    NO GROWTH 5 DAYS Performed at [facility]  06/05/2016    Three or more organisms present,each greater than 10,000 CFU/mL.These organisms,commonly found on external and internal genitalia,are considered to be colonizers.No further testing performed.      Lab Results  Component Value Date   ALBUMIN 4.2 06/20/2019   ALBUMIN 4.4 05/30/2019   ALBUMIN 4.3 10/26/2017    No results found for: MG Lab Results  Component Value Date   VD25OH 57 08/31/2017    No results found for: PREALBUMIN CBC EXTENDED Latest Ref Rng & Units 06/20/2019 05/30/2019 05/02/2019  WBC 3.4 - 10.8 x10E3/uL 4.3 4.5 4.6  RBC 3.77 - 5.28 x10E6/uL 3.80 3.53(L) 3.65(L)  HGB 11.1 - 15.9 g/dL 13.0 12.2 12.3  HCT 34.0 - 46.6 % 38.0 34.6 36.1  PLT 150 - 450 x10E3/uL 363  352 384  NEUTROABS 1.4 - 7.0 x10E3/uL 3.2 3.2 3,611  LYMPHSABS 0.7 - 3.1 x10E3/uL 0.6(L) 0.7 672(L)     Body mass index is 21.58 kg/m.  Orders:  Orders Placed This Encounter  Procedures  . XR Foot Complete Right   No orders of the defined types were placed in this encounter.    Procedures: No procedures performed  Clinical Data: No additional findings.  ROS:  All other systems negative, except as noted in the HPI. Review of Systems  Objective: Vital Signs: Ht 5\' 2"  (1.575 m)   Wt 118 lb (53.5 kg)   BMI 21.58 kg/m   Specialty Comments:  No specialty comments available.  PMFS History: Patient Active Problem List   Diagnosis Date Noted  . Cervical radiculopathy 06/28/2019  . Carpal tunnel syndrome on both sides 06/28/2019  . Right sided sciatica 04/14/2018  . Trochanteric  bursitis of right hip 04/14/2018  . S/P cervical spinal fusion 11/10/2017  . Swelling of left foot 10/26/2017  . Dysphagia 10/26/2017  . Left ankle swelling 10/26/2017  . Localized swelling, mass, and lump of head 08/18/2017  . Gastroesophageal reflux disease 08/06/2017  . Easy bruising 08/06/2017  . Anxiety 04/24/2017  . Mid back pain 03/26/2017  . Left sided chest pain 03/25/2017  . Dyspnea on exertion 03/14/2017  . Hyperkalemia 03/13/2017  . Submandibular gland swelling 01/08/2017  . High risk medication use 09/02/2016  . Urinary frequency 09/02/2016  . Piriformis muscle pain 09/02/2016  . Fatigue 07/22/2016  . Hair loss 07/22/2016  . Right hip pain 04/08/2016  . Right knee pain 02/25/2016  . Dyspepsia 04/05/2015  . Numbness 11/14/2014  . Biceps tendon tear 10/10/2014  . Insomnia 10/10/2014  . Gait disturbance 06/13/2014  . 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 OP (osteoporosis) 01/18/2014  . Other long term (current) drug therapy 01/18/2014  . Other dysphagia 12/29/2013  . Hoarseness 12/29/2013  . 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 Personal history of colonic polyps 07/03/2010  . Nocturia 05/28/2010  . THYROID NODULE 05/17/2008  . Multiple sclerosis ([place]) 05/17/2008  . Chronic fatigue 03/30/2007  . Hyperlipidemia 08/20/2006  . Essential hypertension 08/20/2006  . Rheumatoid arthritis ([place]) 08/20/2006  . CERVICAL CANCER, HX OF 08/20/2006  . GASTRIC ULCER, HX OF 08/20/2006   Past Medical History:  Diagnosis Date  . Anxiety    takes Valium daily as needed  . Bruises easily    d/t meds  . Cancer ([place])    basal cell ca, in situ- uterine   . Cataracts, bilateral   . Chronic back pain   . Dizziness    r/t to meds  . GERD (gastroesophageal reflux disease)    no meds on a regular basis but will take Tums if needed  . Headache(784.0)    r/t neck issues  . History of bronchitis 6-3yrs ago  . History of colon polyps    benign  .  Hyperlipidemia    takes Atorvastatin on Mondays and Fridays  . Hypertension    takes Ramipril daily  . Joint pain   . Joint swelling   . Multiple sclerosis ([place])   . Neuromuscular disorder ([place])    Dr. Park Liter- [facility], follows M.S.  . Nocturia   . OA (osteoarthritis)    Dr Jani Gravel weekly, RA- hands- knees- feet   . PONV (postoperative nausea and vomiting)    trouble urinating after surgery in 2014  . Rheumatoid arthritis(714.0)    Dr Estanislado Pandy takes  [name] daily  . Right wrist fracture   . Skin cancer   . Urinary retention    sees Dr.Wrenn about 2 times a yr    Family History  Problem Relation Age of Onset  . Cancer Mother        pancreatic  . Diabetes Mother   . Heart disease Father        Rheumatic  . Cancer Father        ? stomach  . Cancer Sister        stomach  . Cancer Maternal Aunt        X 4; ? primary  . Heart disease Paternal Aunt   . Cancer Maternal Grandmother        cervical  . Colon cancer Other        Aunts  . Multiple sclerosis Daughter   . Hypertension Neg Hx   . Stroke Neg Hx     Past Surgical History:  Procedure Laterality Date  . ABDOMINAL HYSTERECTOMY     1985  . APPENDECTOMY     with TAH  . BACK SURGERY     several  . BREAST BIOPSY Right    Results were negative  . BROW LIFT Bilateral 10/02/2016   Procedure: BILATERAL LOWER LID BLEPHAROPLASTY;  Surgeon: Clista Bernhardt, MD;  Location: [name];  Service: Plastics;  Laterality: Bilateral;  . CARPAL TUNNEL RELEASE Right   . cataracts    . CERVICAL FUSION     Dr Joya Salm  . CERVICAL FUSION  12/31/2012   Dr Joya Salm  . CHEST TUBE INSERTION     for traumatic Pneumothorax  . COLONOSCOPY  07/16/2010   normal   . COLONOSCOPY W/ POLYPECTOMY  1997   negative since; Dr Deatra Ina  . ESOPHAGOGASTRODUODENOSCOPY  07/16/2010   normal  . eye lid raise    . EYE SURGERY Bilateral    cataracts removed - /w IOL  . JOINT REPLACEMENT    . LUMBAR FUSION     Dr Joya Salm  . NASAL  SINUS SURGERY    . POSTERIOR CERVICAL FUSION/FORAMINOTOMY N/A 12/31/2012   Procedure: POSTERIOR LATERAL CERVICAL FUSION/FORAMINOTOMY LEVEL 1 CERVICAL THREE-FOUR WITH LATERAL MASS SCREWS;  Surgeon: Floyce Stakes, MD;  Location: [facility];  Service: Neurosurgery;  Laterality: N/A;  . PTOSIS REPAIR Bilateral 10/02/2016   Procedure: INTERNAL PTOSIS REPAIR;  Surgeon: Clista Bernhardt, MD;  Location: [facility];  Service: Plastics;  Laterality: Bilateral;  . SKIN BIOPSY    . THYROID SURGERY     R lobe removed- 1972, has grown back - CT last done- 2018  . TOTAL HIP ARTHROPLASTY  12/22/2011   Procedure: TOTAL HIP ARTHROPLASTY;  Surgeon: Kerin Salen, MD;  Location: [facility];  Service: Orthopedics;  Laterality: Left;  . TOTAL SHOULDER ARTHROPLASTY    . TUBAL LIGATION     Social History   Occupational History  . Occupation: retired  Tobacco Use  . Smoking status: Former Smoker    Packs/day: 2.50    Years: 20.00    Pack years: 50.00    Types: Cigarettes    Quit date: 05/26/1981    Years since quitting: 38.2  . Smokeless tobacco: Never Used  . Tobacco comment: Smoked [phone], up to 2.5 ppd  Substance and Sexual Activity  . Alcohol use: Yes    Alcohol/week: 14.0 standard drinks    Types: 14 Cans of beer per week    Comment: 2% beer  . Drug use: Never  .  Sexual activity: Yes

## 2019-09-06 ENCOUNTER — Encounter: Payer: Self-pay | Admitting: Physician Assistant

## 2019-09-06 ENCOUNTER — Other Ambulatory Visit: Payer: Self-pay

## 2019-09-06 ENCOUNTER — Ambulatory Visit: Payer: Medicare Other | Admitting: Physician Assistant

## 2019-09-06 DIAGNOSIS — M79671 Pain in right foot: Secondary | ICD-10-CM

## 2019-09-06 MED ORDER — LIDOCAINE HCL 1 % IJ SOLN
1.0000 mL | INTRAMUSCULAR | Status: AC | PRN
  Administered 2019-09-06: 16:00:00 1 mL

## 2019-09-06 MED ORDER — METHYLPREDNISOLONE ACETATE 40 MG/ML IJ SUSP
40.0000 mg | INTRAMUSCULAR | Status: AC | PRN
  Administered 2019-09-06: 40 mg via INTRA_ARTICULAR

## 2019-09-06 NOTE — Progress Notes (Signed)
Office Visit Note   Patient: Sherri [name]           Date of Birth: 01-07-1941           MRN: [id number] Visit Date: 09/06/2019              Requested by: Binnie Rail, MD [place],  [place] [id number] PCP: Binnie Rail, MD  Chief Complaint  Patient presents with  . Right Foot - Follow-up      HPI: This is a pleasant 79 year old woman who is 1 week status post third webspace injection into her left foot for a Morton's neuroma.  A few days after that she began having pain in the second webspace of the right foot.  She was seen at that time and was provided a metatarsal pad.  She does not think this helped after she repositioned.  She describes her pain as exactly as it was in her left foot  Assessment & Plan: Visit Diagnoses: No diagnosis found.  Plan: She would like to go forward with an injection into the second webspace of the right foot.  She already has a follow-up planned with Dr. Sharol Given  Follow-Up Instructions: No follow-ups on file.   Ortho Exam  Patient is alert, oriented, no adenopathy, well-dressed, normal affect, normal respiratory effort. Right foot no swelling no cellulitis she has tender to palpation in the second webspace on the plantar surface.  I could not elicit a Mulder's click but she had increased pain with compression  Imaging: No results found. No images are attached to the encounter.  Labs: Lab Results  Component Value Date   HGBA1C 5.6 10/24/2013   HGBA1C 5.2 11/11/2011   HGBA1C 5.6 05/17/2008   ESRSEDRATE 51 (H) 05/02/2019   ESRSEDRATE 30 07/22/2016   CRP 0.3 (L) 07/22/2016   REPTSTATUS 10/26/2014 FINAL 10/20/2014   CULT  10/20/2014    NO GROWTH 5 DAYS Performed at [facility]  06/05/2016    Three or more organisms present,each greater than 10,000 CFU/mL.These organisms,commonly found on external and internal genitalia,are considered to be colonizers.No further testing performed.      Lab  Results  Component Value Date   ALBUMIN 4.2 06/20/2019   ALBUMIN 4.4 05/30/2019   ALBUMIN 4.3 10/26/2017    No results found for: MG Lab Results  Component Value Date   VD25OH 57 08/31/2017    No results found for: PREALBUMIN CBC EXTENDED Latest Ref Rng & Units 06/20/2019 05/30/2019 05/02/2019  WBC 3.4 - 10.8 x10E3/uL 4.3 4.5 4.6  RBC 3.77 - 5.28 x10E6/uL 3.80 3.53(L) 3.65(L)  HGB 11.1 - 15.9 g/dL 13.0 12.2 12.3  HCT 34.0 - 46.6 % 38.0 34.6 36.1  PLT 150 - 450 x10E3/uL 363 352 384  NEUTROABS 1.4 - 7.0 x10E3/uL 3.2 3.2 3,611  LYMPHSABS 0.7 - 3.1 x10E3/uL 0.6(L) 0.7 672(L)     There is no height or weight on file to calculate BMI.  Orders:  No orders of the defined types were placed in this encounter.  No orders of the defined types were placed in this encounter.    Procedures: Small Joint Inj on 09/06/2019 4:17 PM Indications: pain and diagnostic evaluation Details: 25 G needle, dorsal approach  Spinal Needle: No  Medications: 1 mL lidocaine 1 %; 40 mg methylPREDNISolone acetate 40 MG/ML Outcome: tolerated well, no immediate complications Procedure, treatment alternatives, risks and benefits explained, specific risks discussed. Consent was given by the patient.  Clinical Data: No additional findings.  ROS:  All other systems negative, except as noted in the HPI. Review of Systems  Objective: Vital Signs: There were no vitals taken for this visit.  Specialty Comments:  No specialty comments available.  PMFS History: Patient Active Problem List   Diagnosis Date Noted  . Cervical radiculopathy 06/28/2019  . Carpal tunnel syndrome on both sides 06/28/2019  . Right sided sciatica 04/14/2018  . Trochanteric bursitis of right hip 04/14/2018  . S/P cervical spinal fusion 11/10/2017  . Swelling of left foot 10/26/2017  . Dysphagia 10/26/2017  . Left ankle swelling 10/26/2017  . Localized swelling, mass, and lump of head 08/18/2017  . Gastroesophageal  reflux disease 08/06/2017  . Easy bruising 08/06/2017  . Anxiety 04/24/2017  . Mid back pain 03/26/2017  . Left sided chest pain 03/25/2017  . Dyspnea on exertion 03/14/2017  . Hyperkalemia 03/13/2017  . Submandibular gland swelling 01/08/2017  . High risk medication use 09/02/2016  . Urinary frequency 09/02/2016  . Piriformis muscle pain 09/02/2016  . Fatigue 07/22/2016  . Hair loss 07/22/2016  . Right hip pain 04/08/2016  . Right knee pain 02/25/2016  . Dyspepsia 04/05/2015  . Numbness 11/14/2014  . Biceps tendon tear 10/10/2014  . Insomnia 10/10/2014  . Gait disturbance 06/13/2014  . 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 OP (osteoporosis) 01/18/2014  . Other long term (current) drug therapy 01/18/2014  . Other dysphagia 12/29/2013  . Hoarseness 12/29/2013  . 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 Personal history of colonic polyps 07/03/2010  . Nocturia 05/28/2010  . THYROID NODULE 05/17/2008  . Multiple sclerosis ([place]) 05/17/2008  . Chronic fatigue 03/30/2007  . Hyperlipidemia 08/20/2006  . Essential hypertension 08/20/2006  . Rheumatoid arthritis ([place]) 08/20/2006  . CERVICAL CANCER, HX OF 08/20/2006  . GASTRIC ULCER, HX OF 08/20/2006   Past Medical History:  Diagnosis Date  . Anxiety    takes Valium daily as needed  . Bruises easily    d/t meds  . Cancer ([place])    basal cell ca, in situ- uterine   . Cataracts, bilateral   . Chronic back pain   . Dizziness    r/t to meds  . GERD (gastroesophageal reflux disease)    no meds on a regular basis but will take Tums if needed  . Headache(784.0)    r/t neck issues  . History of bronchitis 6-30yrs ago  . History of colon polyps    benign  . Hyperlipidemia    takes Atorvastatin on Mondays and Fridays  . Hypertension    takes Ramipril daily  . Joint pain   . Joint swelling   . Multiple sclerosis ([place])   . Neuromuscular disorder ([place])    Dr. Park Liter- [facility], follows M.S.  .  Nocturia   . OA (osteoarthritis)    Dr Jani Gravel weekly, RA- hands- knees- feet   . PONV (postoperative nausea and vomiting)    trouble urinating after surgery in 2014  . Rheumatoid arthritis(714.0)    Dr Estanislado Pandy takes [name] daily  . Right wrist fracture   . Skin cancer   . Urinary retention    sees Dr.Wrenn about 2 times a yr    Family History  Problem Relation Age of Onset  . Cancer Mother        pancreatic  . Diabetes Mother   . Heart disease Father        Rheumatic  . Cancer Father        ?  stomach  . Cancer Sister        stomach  . Cancer Maternal Aunt        X 4; ? primary  . Heart disease Paternal Aunt   . Cancer Maternal Grandmother        cervical  . Colon cancer Other        Aunts  . Multiple sclerosis Daughter   . Hypertension Neg Hx   . Stroke Neg Hx     Past Surgical History:  Procedure Laterality Date  . ABDOMINAL HYSTERECTOMY     1985  . APPENDECTOMY     with TAH  . BACK SURGERY     several  . BREAST BIOPSY Right    Results were negative  . BROW LIFT Bilateral 10/02/2016   Procedure: BILATERAL LOWER LID BLEPHAROPLASTY;  Surgeon: Clista Bernhardt, MD;  Location: [facility];  Service: Plastics;  Laterality: Bilateral;  . CARPAL TUNNEL RELEASE Right   . cataracts    . CERVICAL FUSION     Dr Joya Salm  . CERVICAL FUSION  12/31/2012   Dr Joya Salm  . CHEST TUBE INSERTION     for traumatic Pneumothorax  . COLONOSCOPY  07/16/2010   normal   . COLONOSCOPY W/ POLYPECTOMY  1997   negative since; Dr Deatra Ina  . ESOPHAGOGASTRODUODENOSCOPY  07/16/2010   normal  . eye lid raise    . EYE SURGERY Bilateral    cataracts removed - /w IOL  . JOINT REPLACEMENT    . LUMBAR FUSION     Dr Joya Salm  . NASAL SINUS SURGERY    . POSTERIOR CERVICAL FUSION/FORAMINOTOMY N/A 12/31/2012   Procedure: POSTERIOR LATERAL CERVICAL FUSION/FORAMINOTOMY LEVEL 1 CERVICAL THREE-FOUR WITH LATERAL MASS SCREWS;  Surgeon: Floyce Stakes, MD;  Location: [facility];  Service:  Neurosurgery;  Laterality: N/A;  . PTOSIS REPAIR Bilateral 10/02/2016   Procedure: INTERNAL PTOSIS REPAIR;  Surgeon: Clista Bernhardt, MD;  Location: [facility];  Service: Plastics;  Laterality: Bilateral;  . SKIN BIOPSY    . THYROID SURGERY     R lobe removed- 1972, has grown back - CT last done- 2018  . TOTAL HIP ARTHROPLASTY  12/22/2011   Procedure: TOTAL HIP ARTHROPLASTY;  Surgeon: Kerin Salen, MD;  Location: [facility];  Service: Orthopedics;  Laterality: Left;  . TOTAL SHOULDER ARTHROPLASTY    . TUBAL LIGATION     Social History   Occupational History  . Occupation: retired  Tobacco Use  . Smoking status: Former Smoker    Packs/day: 2.50    Years: 20.00    Pack years: 50.00    Types: Cigarettes    Quit date: 05/26/1981    Years since quitting: 38.3  . Smokeless tobacco: Never Used  . Tobacco comment: Smoked [phone], up to 2.5 ppd  Substance and Sexual Activity  . Alcohol use: Yes    Alcohol/week: 14.0 standard drinks    Types: 14 Cans of beer per week    Comment: 2% beer  . Drug use: Never  . Sexual activity: Yes

## 2019-09-07 MED FILL — OLUMIANT 2 MG TABS: 2 | 30 days supply | Qty: 30 | Fill #2

## 2019-09-08 ENCOUNTER — Other Ambulatory Visit: Payer: Self-pay | Admitting: Rheumatology

## 2019-09-08 NOTE — Telephone Encounter (Signed)
ok 

## 2019-09-08 NOTE — Telephone Encounter (Signed)
Last Visit: 07/28/2019 Next Visit: 12/28/2019 Labs: 06/20/2019 MCV is elevated.Creatinine is elevated most likely due to diuretic use.   Okay to refill methotrexate?

## 2019-09-09 ENCOUNTER — Telehealth: Payer: Self-pay | Admitting: Internal Medicine

## 2019-09-09 DIAGNOSIS — M5412 Radiculopathy, cervical region: Secondary | ICD-10-CM

## 2019-09-09 NOTE — Chronic Care Management (AMB) (Signed)
°  Chronic Care Management   Note  09/09/2019 Name: [name] MRN: [id number] DOB: Mar 26, 1941  Sherri [name] is a 79 y.o. year old female who is a primary care patient of Burns, Claudina Lick, MD. I reached out to [name] by phone today in response to a referral sent by [name]'s PCP, Binnie Rail, MD.   [name] was given information about Chronic Care Management services today including:  1. CCM service includes personalized support from designated clinical staff supervised by her physician, including individualized plan of care and coordination with other care providers 2. 24/7 contact phone numbers for assistance for urgent and routine care needs. 3. Service will only be billed when office clinical staff spend 20 minutes or more in a month to coordinate care. 4. Only one practitioner may furnish and bill the service in a calendar month. 5. The patient may stop CCM services at any time (effective at the end of the month) by phone call to the office staff.   Patient agreed to services and verbal consent obtained.   Follow up plan:   [name] [employer] Scheduler

## 2019-09-10 ENCOUNTER — Other Ambulatory Visit: Payer: Self-pay | Admitting: Rheumatology

## 2019-09-12 NOTE — Telephone Encounter (Signed)
Last Visit: 07/28/2019 Next Visit: 12/28/2019  Current Dose per office note on 07/28/19: prednisone 6 mg daily.   Okay to refill per Dr. Estanislado Pandy

## 2019-09-13 ENCOUNTER — Other Ambulatory Visit: Payer: Self-pay | Admitting: Internal Medicine

## 2019-09-14 ENCOUNTER — Encounter: Payer: Self-pay | Admitting: *Deleted

## 2019-09-15 ENCOUNTER — Other Ambulatory Visit: Payer: Self-pay

## 2019-09-15 ENCOUNTER — Ambulatory Visit: Payer: Medicare Other | Admitting: Physician Assistant

## 2019-09-15 ENCOUNTER — Encounter: Payer: Self-pay | Admitting: Physician Assistant

## 2019-09-15 DIAGNOSIS — Z1283 Encounter for screening for malignant neoplasm of skin: Secondary | ICD-10-CM | POA: Diagnosis not present

## 2019-09-15 DIAGNOSIS — Z85828 Personal history of other malignant neoplasm of skin: Secondary | ICD-10-CM

## 2019-09-15 DIAGNOSIS — L570 Actinic keratosis: Secondary | ICD-10-CM | POA: Diagnosis not present

## 2019-09-15 NOTE — Progress Notes (Addendum)
   Follow-Up Visit   Subjective  Sherri [name] is a 79 y.o. female who presents for the following: Follow-up (no new spots but recheck her neck). Numerous crusts on her legs, chest and arms.  The following portions of the chart were reviewed this encounter and updated as appropriate: Tobacco  Allergies  Meds  Problems  Med Hx  Surg Hx  Fam Hx      Objective  Well appearing patient in no apparent distress; mood and affect are within normal limits.  A full examination was performed including scalp, head, eyes, ears, nose, lips, neck, chest, axillae, abdomen, back, buttocks, bilateral upper extremities, bilateral lower extremities, hands, feet, fingers, toes, fingernails, and toenails. All findings within normal limits unless otherwise noted below.  Objective  Left Breast, Left Forearm - Anterior (2), Left Lower Leg - Anterior (2), Left Posterior Neck, Right Lower Leg - Anterior (2): Erythematous patches with gritty scale.  Objective  Head - to toe: No DN, No signs of NMSC  Objective  Right Lower Leg - Anterior: All scars clear  Assessment & Plan  AK (actinic keratosis) (8) Left Forearm - Anterior (2); Left Lower Leg - Anterior (2); Right Lower Leg - Anterior (2); Left Breast; Left Posterior Neck  Destruction of lesion - Left Breast, Left Forearm - Anterior (2), Left Lower Leg - Anterior (2), Left Posterior Neck, Right Lower Leg - Anterior (2) Complexity: simple   Destruction method: cryotherapy   Informed consent: discussed and consent obtained   Timeout:  patient name, date of birth, surgical site, and procedure verified Lesion destroyed using liquid nitrogen: Yes   Cryotherapy cycles:  1 Outcome: patient tolerated procedure well with no complications   Post-procedure details: wound care instructions given    Screening exam for skin cancer Head - to toe  observe  History of SCC (squamous cell carcinoma) of skin Right Lower Leg - Anterior

## 2019-09-15 NOTE — Patient Instructions (Signed)
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  Told her that in the Fall/Winter she could use Fluorouracil on her legs.  Told her that she could use it nightly for 2 weeks or until a brisk reaction occurs.  Make sure she avoids the sun because it is sun sensitive.

## 2019-09-19 DIAGNOSIS — R3912 Poor urinary stream: Secondary | ICD-10-CM | POA: Diagnosis not present

## 2019-09-19 DIAGNOSIS — R3914 Feeling of incomplete bladder emptying: Secondary | ICD-10-CM | POA: Diagnosis not present

## 2019-09-19 DIAGNOSIS — N3010 Interstitial cystitis (chronic) without hematuria: Secondary | ICD-10-CM | POA: Diagnosis not present

## 2019-09-19 DIAGNOSIS — M6281 Muscle weakness (generalized): Secondary | ICD-10-CM | POA: Diagnosis not present

## 2019-09-26 ENCOUNTER — Ambulatory Visit: Payer: Medicare Other | Admitting: Family Medicine

## 2019-09-26 ENCOUNTER — Other Ambulatory Visit: Payer: Self-pay

## 2019-09-26 ENCOUNTER — Ambulatory Visit: Payer: Medicare Other | Admitting: Orthopedic Surgery

## 2019-09-26 ENCOUNTER — Encounter: Payer: Self-pay | Admitting: Family Medicine

## 2019-09-26 VITALS — BP 127/75 | HR 72 | Temp 97.1°F | Ht 62.5 in | Wt 118.0 lb

## 2019-09-26 DIAGNOSIS — G35D Multiple sclerosis, unspecified: Secondary | ICD-10-CM

## 2019-09-26 DIAGNOSIS — R5383 Other fatigue: Secondary | ICD-10-CM | POA: Diagnosis not present

## 2019-09-26 DIAGNOSIS — K5900 Constipation, unspecified: Secondary | ICD-10-CM

## 2019-09-26 DIAGNOSIS — M0579 Rheumatoid arthritis with rheumatoid factor of multiple sites without organ or systems involvement: Secondary | ICD-10-CM | POA: Diagnosis not present

## 2019-09-26 DIAGNOSIS — G35 Multiple sclerosis: Secondary | ICD-10-CM

## 2019-09-26 DIAGNOSIS — R350 Frequency of micturition: Secondary | ICD-10-CM | POA: Diagnosis not present

## 2019-09-26 MED ORDER — OXYCODONE HCL 10 MG PO TABS: ORAL_TABLET | ORAL | 0 refills | Status: DC

## 2019-09-26 NOTE — Patient Instructions (Addendum)
We will continue current treatment plan. Please take tramadol as needed and avoid taking close to time of taking oxycodone. Stay well hydrated. Try to stay active.   Follow up in 4  months   Rheumatoid Arthritis Rheumatoid arthritis (RA) is a long-term (chronic) disease. RA causes inflammation in your joints. Your joints may feel painful, stiff, swollen, and warm. RA may start slowly. It most often affects the small joints of the hands and feet. It can also affect other parts of the body. Symptoms of RA often come and go. There is no cure for RA, but medicines can help your symptoms. What are the causes?  RA is an autoimmune disease. This means that your body's defense system (immune system) attacks healthy parts of your body by mistake. The exact cause of RA is not known. What increases the risk?  Being a woman.  Having a family history of RA or other diseases like RA.  Smoking.  Being overweight.  Being exposed to pollutants or chemicals. What are the signs or symptoms?  Morning stiffness that lasts longer than 30 minutes. This is often the first symptom.  Symptoms start slowly. They are often worse in the morning.  As RA gets worse, symptoms may include: ? Pain, stiffness, swelling, warmth, and tenderness in joints on both sides of your body. ? Loss of energy. ? Not feeling hungry. ? Weight loss. ? A low fever. ? Dry eyes and a dry mouth. ? Firm lumps that grow under your skin. ? Changes in the way your joints look. ? Changes in the way your joints work.  Symptoms vary and they: ? Often come and go. ? Sometimes get worse for a period of time. These are called flares. How is this treated?   Treatment may include: ? Taking good care of yourself. Be sure to rest as needed, eat a healthy diet, and exercise. ? Medicines. These may include:  Pain relievers.  Medicines to help with inflammation.  Disease-modifying antirheumatic drugs (DMARDs).  Medicines called  biologic response modifiers. ? Physical therapy and occupational therapy. ? Surgery, if joint damage is very bad. Your doctor will work with you to find the best treatments. Follow these instructions at home: Activity  Return to your normal activities as told by your doctor. Ask your doctor what activities are safe for you.  Rest when you have a flare.  Exercise as told by your doctor. General instructions  Take over-the-counter and prescription medicines only as told by your doctor.  Keep all follow-up visits as told by your doctor. This is important. Where to find more information  [employer] of Rheumatology: www.rheumatology.[place]: www.arthritis.org Contact a doctor if:  You have a flare.  You have a fever.  You have problems because of your medicines. Get help right away if:  You have chest pain.  You have trouble breathing.  You get a hot, painful joint all of a sudden, and it is worse than your normal joint aches. Summary  RA is a long-term disease.  Symptoms of RA start slowly. They are often worse in the morning.  RA causes inflammation in your joints. This information is not intended to replace advice given to you by your health care provider. Make sure you discuss any questions you have with your health care provider. Document Revised: 01/13/2018 Document Reviewed: 01/13/2018 Elsevier Patient Education  2020 [employer].   Multiple Sclerosis Multiple sclerosis (MS) is a disease of the brain, spinal cord, and optic  nerves (central nervous system). It causes the body's disease-fighting (immune) system to destroy the protective covering (myelin sheath) around nerves in the brain. When this happens, signals (nerve impulses) going to and from the brain and spinal cord do not get sent properly or may not get sent at all. There are several types of MS:  Relapsing-remitting MS. This is the most common type. This causes sudden attacks  of symptoms. After an attack, you may recover completely until the next attack, or some symptoms may remain permanently.  Secondary progressive MS. This usually develops after the onset of relapsing-remitting MS. Similar to relapsing-remitting MS, this type also causes sudden attacks of symptoms. Attacks may be less frequent, but symptoms slowly get worse (progress) over time.  Primary progressive MS. This causes symptoms that steadily progress over time. This type of MS does not cause sudden attacks of symptoms. The age of onset of MS varies, but it often develops between 28-48 years of age. MS is a lifelong (chronic) condition. There is no cure, but treatment can help slow down the progression of the disease. What are the causes? The cause of this condition is not known. What increases the risk? You are more likely to develop this condition if:  You are a woman.  You have a relative with MS. However, the condition is not passed from parent to child (inherited).  You have a lack (deficiency) of vitamin D.  You smoke. MS is more common in the [place] than in the [place]. What are the signs or symptoms? Relapsing-remitting and secondary progressive MS cause symptoms to occur in episodes or attacks that may last weeks to months. There may be long periods between attacks in which there are almost no symptoms. Primary progressive MS causes symptoms to steadily progress after they develop. Symptoms of MS vary because of the many different ways it affects the central nervous system. The main symptoms include:  Vision problems and eye pain.  Numbness.  Weakness.  Inability to move your arms, hands, feet, or legs (paralysis).  Balance problems.  Shaking that you cannot control (tremors).  Muscle spasms.  Problems with thinking (cognitive changes). MS can also cause symptoms that are associated with the disease, but are not always the direct result of an MS  attack. They may include:  Inability to control urination or bowel movements (incontinence).  Headaches.  Fatigue.  Inability to tolerate heat.  Emotional changes.  Depression.  Pain. How is this diagnosed? This condition is diagnosed based on:  Your symptoms.  A neurological exam. This involves checking central nervous system function, such as nerve function, reflexes, and coordination.  MRIs of the brain and spinal cord.  Lab tests, including a lumbar puncture that tests the fluid that surrounds the brain and spinal cord (cerebrospinal fluid).  Tests to measure the electrical activity of the brain in response to stimulation (evoked potentials). How is this treated? There is no cure for MS, but medicines can help decrease the number and frequency of attacks and help relieve nuisance symptoms. Treatment options may include:  Medicines that reduce the frequency of attacks. These medicines may be given by injection, by mouth (orally), or through an IV.  Medicines that reduce inflammation (steroids). These may provide short-term relief of symptoms.  Medicines to help control pain, depression, fatigue, or incontinence.  Vitamin D, if you have a deficiency.  Using devices to help you move around (assistive devices), such as braces, a cane, or a walker.  Physical therapy to strengthen and stretch your muscles.  Occupational therapy to help you with everyday tasks.  Alternative or complementary treatments such as exercise, massage, or acupuncture. Follow these instructions at home:  Take over-the-counter and prescription medicines only as told by your health care provider.  Do not drive or use heavy machinery while taking prescription pain medicine.  Use assistive devices as recommended by your physical therapist or your health care provider.  Exercise as directed by your health care provider.  Return to your normal activities as told by your health care provider. Ask  your health care provider what activities are safe for you.  Reach out for support. Share your feelings with friends, family, or a support group.  Keep all follow-up visits as told by your health care provider and therapists. This is important. Where to find more information  National Multiple Sclerosis Society: https://www.nationalmssociety.org Contact a health care provider if:  You feel depressed.  You develop new pain or numbness.  You have tremors.  You have problems with sexual function. Get help right away if:  You develop paralysis.  You develop numbness.  You have problems with your bladder or bowel function.  You develop double vision.  You lose vision in one or both eyes.  You develop suicidal thoughts.  You develop severe confusion. If you ever feel like you may hurt yourself or others, or have thoughts about taking your own life, get help right away. You can go to your nearest emergency department or call:  Your local emergency services (911 in the [place]  A suicide crisis helpline, such as the [place] at 914-051-4678. This is open 24 hours a day. Summary  Multiple sclerosis (MS) is a disease of the central nervous system that causes the body's immune system to destroy the protective covering (myelin sheath) around nerves in the brain.  There are 3 types of MS: relapsing-remitting, secondary progressive, and primary progressive. Relapsing-remitting and secondary progressive MS cause symptoms to occur in episodes or attacks that may last weeks to months. Primary progressive MS causes symptoms to steadily progress after they develop.  There is no cure for MS, but medicines can help decrease the number and frequency of attacks and help relieve nuisance symptoms. Treatment may also include physical or occupational therapy.  If you develop numbness, paralysis, vision problems, or other neurological symptoms, get help right  away. This information is not intended to replace advice given to you by your health care provider. Make sure you discuss any questions you have with your health care provider. Document Revised: 04/24/2017 Document Reviewed: 07/21/2016 Elsevier Patient Education  2020 [employer].

## 2019-09-26 NOTE — Progress Notes (Signed)
I have read the note, and I agree with the clinical assessment and plan.  Zilah Villaflor A. Quillan Whitter, MD, PhD, FAAN Certified in Neurology, Clinical Neurophysiology, Sleep Medicine, Pain Medicine and Neuroimaging  [facility] [address], [address] [place], [place] [address] [phone]  

## 2019-09-26 NOTE — Progress Notes (Signed)
PATIENT: Sherri [name] DOB: [birth year]  REASON FOR VISIT: follow up HISTORY FROM: patient  Chief Complaint  Patient presents with  . Follow-up    rm 8, MS, alone     HISTORY OF PRESENT ILLNESS: Today 09/26/19 Sherri [name] is a 79 y.o. female here today for follow up for MS and pain. NCS/EMG suggestive of mild C8 radiculopathy. Dr Felecia Shelling felt that thumb pain was related to RA. She continues follow up with Dr Estanislado Pandy for RA. Tramadol was increased to 50mg  TID and she continues oxycodone 10mg  at bedtime. Tramadol last filled on 4/18, oxycodone filled 4/5. She usually tries to take 1/2 tramadol tablet (25mg ) three times a day but does use up to three tablets daily. She is taking prednisone 7mg  daily as well as methotrexate and Olumiant. She continues Prolia, calcium and vitamin D for osteoporosis.   She endorses fatigue and generalized pain. No changes in gait or falls. No new numbness or weakness. She continues to have urinary frequency and occasional stress incontinence. She does have chronic constipation. Glycerin suppositories helps with BM. No changes in gait or vision. No falls.   HISTORY: (copied from Dr Garth Bigness note on 05/25/2019)  Sherri [name] is a 79 year old woman who was diagnosed with relapsing remitting MS in 2010.     Update 05/25/19: She is reporting more pain in her hands.   She has had CTR on the right many years ago by Dr. Joya Salm.  She also has pain over the MCP joint.  She does have RA.   Last year she broke her right wrist with a fall.   Her leg just buckled when she stood up.   She takes tramadol 50 mg po qAM and PM and oxycodone 10 mg at night.    Her leg has buckled a few other times.    She has left facial pain.   She had CT maxillofacial and it showed sequelae of prior fillers ( after eyelid lift surgery complicated by a tear)  but no other changes.   She notes reduced sensation V1 and V2 on the left.      She also has RA and is on Methotrexate.    She  is not on any the disease modifying therapy for the MS.  She feels the MS has actually been pretty stable with no change in gait.  She is concerned that the new symptoms in the hands could be due to the MS though I told her I believe that more likely to be due to to the rheumatoid arthritis or carpal tunnel syndrome.   REVIEW OF SYSTEMS: Out of a complete 14 system review of symptoms, the patient complains only of the following symptoms, cramping of hands and feet, fatigue and all other reviewed systems are negative.  ALLERGIES: Allergies  Allergen Reactions  . Darvon [Propoxyphene Hcl] Shortness Of Breath    ? Dose Related ? Lowered respirations greatly  . Meperidine Hcl Shortness Of Breath  . Penicillins Hives and Other (See Comments)    Has patient had a PCN reaction causing immediate rash, facial/tongue/throat swelling, SOB or lightheadedness with hypotension: No SEVERE RASH INVOLVING MUCUS MEMBRANES or SKIN NECROSIS: #  #  #  YES  #  #  #  Has patient had a PCN reaction that required hospitalization No Has patient had a PCN reaction occurring within the last 10 years: No.   . Azathioprine Other (See Comments)    Weakness  . Leflunomide  unknown  . Ezetimibe-Simvastatin Nausea And Vomiting    HOME MEDICATIONS: Outpatient Medications Prior to Visit  Medication Sig Dispense Refill  . atorvastatin (LIPITOR) 10 MG tablet Take 1 tablet by mouth on Mondays and Friday 27 tablet 1  . Biotin 5000 MCG TABS Take 5,000 mcg by mouth daily.     . Calcium Carbonate-Vitamin D (CALCIUM + D PO) Take 1 tablet by mouth daily.     . Carboxymethylcellul-Glycerin (LUBRICATING EYE DROPS OP) Apply 1 drop to eye daily as needed (dry eyes).    . carvedilol (COREG) 6.25 MG tablet TAKE 1 TABLET (6.25 MG TOTAL) BY MOUTH 2 (TWO) TIMES DAILY WITH A MEAL. 180 tablet 1  . Cholecalciferol (VITAMIN D3) 2000 units capsule Take 2,000 Units by mouth daily.    . Cyanocobalamin (VITAMIN B-12 PO) Take 1 tablet by  mouth daily.    [name] denosumab (PROLIA) 60 MG/ML SOSY injection INJECT 60 MG INTO THE SKIN EVERY 6 (SIX) MONTHS. 1 mL 0  . diclofenac sodium (VOLTAREN) 1 % GEL APPLY 3 GRAMS TO 3 LARGE JOINTS 3 TIMES A DAY AS NEEDED 100 g 0  . famotidine (PEPCID) 40 MG tablet Take 1 tablet (40 mg total) by mouth daily. 90 tablet 1  . folic acid (FOLVITE) 1 MG tablet TAKE 2 TABLETS BY MOUTH EVERY DAY 180 tablet 3  . methotrexate (RHEUMATREX) 2.5 MG tablet TAKE 6 TABLETS BY MOUTH ONCE WEEKLY. CAUTION:CHEMOTHERAPY. PROTECT FROM LIGHT. 72 tablet 0  . OLUMIANT 2 MG TABS tablet TAKE ONE TABLET (2MG ) BY MOUTH DAILY 90 tablet 0  . predniSONE (DELTASONE) 1 MG tablet TAKE 4 TABLETS (4 MG TOTAL) BY MOUTH DAILY WITH BREAKFAST. (Patient taking differently: Take 1 mg by mouth daily with breakfast. ) 360 tablet 0  . predniSONE (DELTASONE) 5 MG tablet TAKE 1 TABLET BY MOUTH EVERY DAY WITH BREAKFAST 30 tablet 0  . Pyridoxine HCl (VITAMIN B-6 PO) Take 1 tablet by mouth daily.    . traMADol (ULTRAM) 50 MG tablet TAKE 1 TABLET BY MOUTH THREE TIMES A DAY 90 tablet 5  . Oxycodone HCl 10 MG TABS Take 1 tablet by mouth at bedtime as needed 30 tablet 0  . amLODipine (NORVASC) 2.5 MG tablet TAKE 1 TABLET (2.5 MG TOTAL) BY MOUTH DAILY. NEED OFFICE VISIT FOR MORE REFILLS. 30 tablet 0  . furosemide (LASIX) 20 MG tablet Take 1 tablet (20 mg total) by mouth daily. 90 tablet 1  . omeprazole (PRILOSEC) 40 MG capsule Take 1 capsule (40 mg total) by mouth 2 (two) times daily. 180 capsule 1   No facility-administered medications prior to visit.    PAST MEDICAL HISTORY: Past Medical History:  Diagnosis Date  . Anxiety    takes Valium daily as needed  . Basal cell carcinoma 01/21/1988   left nostril (MOHS), sup-right calf (CX35FU)  . Basal cell carcinoma 03/22/1996   right calf (CX35FU)  . Basal cell carcinoma 03/31/1995   left wing nose  . Bruises easily    d/t meds  . Cancer ([place])    basal cell ca, in situ- uterine   . Cataracts,  bilateral   . Chronic back pain   . Dizziness    r/t to meds  . GERD (gastroesophageal reflux disease)    no meds on a regular basis but will take Tums if needed  . Headache(784.0)    r/t neck issues  . History of bronchitis 6-94yrs ago  . History of colon polyps    benign  .  Hyperlipidemia    takes Atorvastatin on Mondays and Fridays  . Hypertension    takes Ramipril daily  . Joint pain   . Joint swelling   . Multiple sclerosis ([place])   . Neuromuscular disorder ([place])    Dr. Park Liter- [facility], follows M.S.  . Nocturia   . OA (osteoarthritis)    Dr Jani Gravel weekly, RA- hands- knees- feet   . PONV (postoperative nausea and vomiting)    trouble urinating after surgery in 2014  . Rheumatoid arthritis(714.0)    Dr Estanislado Pandy takes [name] daily  . Right wrist fracture   . Skin cancer   . Squamous cell carcinoma of skin 11/02/2001   in situ-right knee (cx37fu)  . Squamous cell carcinoma of skin 11/12/2011   in situ-left forearm (CX35FU), in situ-left foot (CX35FU)  . Squamous cell carcinoma of skin 01/22/2012   in situ-left lower forearm (txpbx)  . Squamous cell carcinoma of skin 11/17/2012   right shin (txpbx)  . Squamous cell carcinoma of skin 10/28/2013   in situ-right shoulder (CX35FU)  . Squamous cell carcinoma of skin 04/28/2014   well diff-right shin (txpbx)  . Squamous cell carcinoma of skin 11/02/2014   in situ-Left shin (txpbx)  . Squamous cell carcinoma of skin 12/15/2014   in situ-Left hand (txpbx), bowens-left side chest (txpbx)  . Squamous cell carcinoma of skin 05/09/2015   in situ-left hand (txpbx)   . Squamous cell carcinoma of skin 05/07/2016   in situ-left inner shin,ant, in situ-left inner shin, post, in situ-left outer forearm, in situ-right knuckle  . Squamous cell carcinoma of skin 03/1302018   in situ-left shoulder (txpbx), in situ-right inner shin (txpbx), in situ-top of left foot (txpbx), KA- left forearm (txpbx)  . Squamous  cell carcinoma of skin 12/02/2016   in situ-left inner shin (txpbx)  . Squamous cell carcinoma of skin 03/04/2017   in situ-left outer sup, shin (txpbx), in situ- right 2nd knuckle finger (txpbx), in situ- Left wrist (txpbx)  . Squamous cell carcinoma of skin 04/06/2018   in situ-above left knee inner (txpbx)  . Squamous cell carcinoma of skin 04/21/2018   in situ-right top hand (txpbx)  . Squamous cell carcinoma of skin 07/08/2018   in situ-right lower inner shin (txpbx)  . Squamous cell carcinoma of skin 04/27/2019   in situ- Left neck(CX35FU), In situ- right neck (CX35FU)  . Urinary retention    sees Dr.Wrenn about 2 times a yr    PAST SURGICAL HISTORY: Past Surgical History:  Procedure Laterality Date  . ABDOMINAL HYSTERECTOMY     1985  . APPENDECTOMY     with TAH  . BACK SURGERY     several  . BREAST BIOPSY Right    Results were negative  . BROW LIFT Bilateral 10/02/2016   Procedure: BILATERAL LOWER LID BLEPHAROPLASTY;  Surgeon: Clista Bernhardt, MD;  Location: [place];  Service: Plastics;  Laterality: Bilateral;  . CARPAL TUNNEL RELEASE Right   . cataracts    . CERVICAL FUSION     Dr Joya Salm  . CERVICAL FUSION  12/31/2012   Dr Joya Salm  . CHEST TUBE INSERTION     for traumatic Pneumothorax  . COLONOSCOPY  07/16/2010   normal   . COLONOSCOPY W/ POLYPECTOMY  1997   negative since; Dr Deatra Ina  . ESOPHAGOGASTRODUODENOSCOPY  07/16/2010   normal  . eye lid raise    . EYE SURGERY Bilateral    cataracts removed - /w IOL  . JOINT REPLACEMENT    .  LUMBAR FUSION     Dr Joya Salm  . NASAL SINUS SURGERY    . POSTERIOR CERVICAL FUSION/FORAMINOTOMY N/A 12/31/2012   Procedure: POSTERIOR LATERAL CERVICAL FUSION/FORAMINOTOMY LEVEL 1 CERVICAL THREE-FOUR WITH LATERAL MASS SCREWS;  Surgeon: Floyce Stakes, MD;  Location: [facility];  Service: Neurosurgery;  Laterality: N/A;  . PTOSIS REPAIR Bilateral 10/02/2016   Procedure: INTERNAL PTOSIS REPAIR;  Surgeon: Clista Bernhardt, MD;   Location: [place];  Service: Plastics;  Laterality: Bilateral;  . SKIN BIOPSY    . THYROID SURGERY     R lobe removed- 1972, has grown back - CT last done- 2018  . TOTAL HIP ARTHROPLASTY  12/22/2011   Procedure: TOTAL HIP ARTHROPLASTY;  Surgeon: Kerin Salen, MD;  Location: [place];  Service: Orthopedics;  Laterality: Left;  . TOTAL SHOULDER ARTHROPLASTY    . TUBAL LIGATION      FAMILY HISTORY: Family History  Problem Relation Age of Onset  . Cancer Mother        pancreatic  . Diabetes Mother   . Heart disease Father        Rheumatic  . Cancer Father        ? stomach  . Cancer Sister        stomach  . Cancer Maternal Aunt        X 4; ? primary  . Heart disease Paternal Aunt   . Cancer Maternal Grandmother        cervical  . Colon cancer Other        Aunts  . Multiple sclerosis Daughter   . Hypertension Neg Hx   . Stroke Neg Hx     SOCIAL HISTORY: Social History   Socioeconomic History  . Marital status: Married    Spouse name: Not on file  . Number of children: Not on file  . Years of education: Not on file  . Highest education level: Not on file  Occupational History  . Occupation: retired  Tobacco Use  . Smoking status: Former Smoker    Packs/day: 2.50    Years: 20.00    Pack years: 50.00    Types: Cigarettes    Quit date: 05/26/1981    Years since quitting: 38.3  . Smokeless tobacco: Never Used  . Tobacco comment: Smoked [phone], up to 2.5 ppd  Substance and Sexual Activity  . Alcohol use: Yes    Alcohol/week: 14.0 standard drinks    Types: 14 Cans of beer per week    Comment: 2% beer  . Drug use: Never  . Sexual activity: Yes  Other Topics Concern  . Not on file  Social History Narrative  . Not on file   Social Determinants of Health   Financial Resource Strain:   . Difficulty of Paying Living Expenses:   Food Insecurity:   . Worried About Charity fundraiser in the Last Year:   . Arboriculturist in the Last Year:   Transportation Needs:   .  Film/video editor (Medical):   [name] Lack of Transportation (Non-Medical):   Physical Activity:   . Days of Exercise per Week:   . Minutes of Exercise per Session:   Stress:   . Feeling of Stress :   Social Connections:   . Frequency of Communication with Friends and Family:   . Frequency of Social Gatherings with Friends and Family:   . Attends Religious Services:   . Active Member of Clubs or Organizations:   .  Attends Archivist Meetings:   [name] Marital Status:   Intimate Partner Violence:   . Fear of Current or Ex-Partner:   . Emotionally Abused:   [name] Physically Abused:   . Sexually Abused:       PHYSICAL EXAM  Vitals:   09/26/19 1346  BP: 127/75  Pulse: 72  Temp: (!) 97.1 F (36.2 C)  Weight: 118 lb (53.5 kg)  Height: 5' 2.5" (1.588 m)   Body mass index is 21.24 kg/m.  Generalized: Well developed, in no acute distress  Cardiology: normal rate and rhythm, no murmur noted Respiratory: clear to auscultation bilaterally  Neurological examination  Mentation: Alert oriented to time, place, history taking. Follows all commands speech and language fluent Cranial nerve II-XII: Pupils were equal round reactive to light. Extraocular movements were full, visual field were full  Motor: The motor testing reveals 5 over 5 strength of all 4 extremities. Good grip strength bilaterally. Good symmetric motor tone is noted throughout.  Sensory: Sensory testing is intact to soft touch on all 4 extremities. No evidence of extinction is noted.  Coordination: Cerebellar testing reveals good finger-nose-finger and heel-to-shin bilaterally.  Gait and station: Gait is normal. Reflexes: Deep tendon reflexes are symmetric and normal bilaterally.   DIAGNOSTIC DATA (LABS, IMAGING, TESTING) - I reviewed patient records, labs, notes, testing and imaging myself where available.  No flowsheet data found.   Lab Results  Component Value Date   WBC 4.3 06/20/2019   HGB 13.0 06/20/2019     HCT 38.0 06/20/2019   MCV 100 (H) 06/20/2019   PLT 363 06/20/2019      Component Value Date/Time   NA 139 06/20/2019 1335   NA 136 02/22/2015 0000   K 4.9 06/20/2019 1335   K 4.4 02/22/2015 0000   CL 99 06/20/2019 1335   CL 98 02/22/2015 0000   CO2 27 06/20/2019 1335   CO2 30 02/22/2015 0000   GLUCOSE 102 (H) 06/20/2019 1335   GLUCOSE 98 05/02/2019 1344   BUN 10 06/20/2019 1335   CREATININE 1.01 (H) 06/20/2019 1335   CREATININE 0.85 05/02/2019 1344   CALCIUM 9.5 06/20/2019 1335   CALCIUM 9.6 02/22/2015 0000   PROT 6.4 06/20/2019 1335   PROT 6.6 02/22/2015 0000   ALBUMIN 4.2 06/20/2019 1335   ALBUMIN 4.3 02/22/2015 0000   AST 30 06/20/2019 1335   AST 24 02/22/2015 0000   ALT 14 06/20/2019 1335   ALT 18 02/22/2015 0000   ALKPHOS 54 06/20/2019 1335   ALKPHOS 34 02/22/2015 0000   BILITOT 0.3 06/20/2019 1335   GFRNONAA 54 (L) 06/20/2019 1335   GFRNONAA 66 05/02/2019 1344   GFRAA 62 06/20/2019 1335   GFRAA 77 05/02/2019 1344   Lab Results  Component Value Date   CHOL 188 08/05/2018   HDL 72 08/05/2018   LDLCALC 96 08/05/2018   LDLDIRECT 122.9 11/15/2012   TRIG 103 08/05/2018   CHOLHDL 2.6 08/05/2018   Lab Results  Component Value Date   HGBA1C 5.6 10/24/2013   Lab Results  Component Value Date   VITAMINB12 789 06/20/2019   Lab Results  Component Value Date   TSH 1.11 10/26/2017       ASSESSMENT AND PLAN 79 y.o. year old female  has a past medical history of Anxiety, Basal cell carcinoma (01/21/1988), Basal cell carcinoma (03/22/1996), Basal cell carcinoma (03/31/1995), Bruises easily, Cancer ([place]), Cataracts, bilateral, Chronic back pain, Dizziness, GERD (gastroesophageal reflux disease), Headache(784.0), History of bronchitis (6-43yrs ago), History of colon  polyps, Hyperlipidemia, Hypertension, Joint pain, Joint swelling, Multiple sclerosis ([place]), Neuromuscular disorder ([place]), Nocturia, OA (osteoarthritis), PONV (postoperative nausea and vomiting),  Rheumatoid arthritis(714.0), Right wrist fracture, Skin cancer, Squamous cell carcinoma of skin (11/02/2001), Squamous cell carcinoma of skin (11/12/2011), Squamous cell carcinoma of skin (01/22/2012), Squamous cell carcinoma of skin (11/17/2012), Squamous cell carcinoma of skin (10/28/2013), Squamous cell carcinoma of skin (04/28/2014), Squamous cell carcinoma of skin (11/02/2014), Squamous cell carcinoma of skin (12/15/2014), Squamous cell carcinoma of skin (05/09/2015), Squamous cell carcinoma of skin (05/07/2016), Squamous cell carcinoma of skin (03/1302018), Squamous cell carcinoma of skin (12/02/2016), Squamous cell carcinoma of skin (03/04/2017), Squamous cell carcinoma of skin (04/06/2018), Squamous cell carcinoma of skin (04/21/2018), Squamous cell carcinoma of skin (07/08/2018), Squamous cell carcinoma of skin (04/27/2019), and Urinary retention. here with     ICD-10-CM   1. Multiple sclerosis ([place])  G35   2. Rheumatoid arthritis involving multiple sites with positive rheumatoid factor (HCC)  M05.79   3. Other fatigue  R53.83   4. Urinary frequency  R35.0   5. Constipation, unspecified constipation type  K59.00     Sherri is doing well from an MS standpoint.  She continues to have generalized pain that has suffered more recently with bilateral hand and foot pain.  She will continue tramadol 50 mg up to 3 times daily and oxycodone 10 mg at bedtime.  I have reviewed PMP aware and refills are appropriate.  She was encouraged to discuss any other treatment options from an RA perspective with Dr. Estanislado Pandy next week at her upcoming appointment.  She was encouraged to remain as active as possible.  She will stay well-hydrated and focus on healthy lifestyle habits.  She will follow-up with Dr. Ladonna Snide in 4 months, sooner if needed.  She verbalizes understanding and agreement with this plan.   No orders of the defined types were placed in this encounter.    Meds ordered this encounter  Medications    . Oxycodone HCl 10 MG TABS    Sig: Take 1 tablet by mouth at bedtime as needed    Dispense:  30 tablet    Refill:  0    Order Specific Question:   Supervising Provider    Answer:   Melvenia Beam [id number]      I spent 15 minutes with the patient. 50% of this time was spent counseling and educating patient on plan of care and medications.    Debbora Presto, FNP-C 09/26/2019, 2:49 PM [facility] [address] [place], [place] [address] [phone]

## 2019-09-27 ENCOUNTER — Ambulatory Visit: Payer: Medicare Other | Admitting: Orthopedic Surgery

## 2019-09-30 DIAGNOSIS — R3914 Feeling of incomplete bladder emptying: Secondary | ICD-10-CM | POA: Diagnosis not present

## 2019-09-30 DIAGNOSIS — M62838 Other muscle spasm: Secondary | ICD-10-CM | POA: Diagnosis not present

## 2019-09-30 DIAGNOSIS — M6281 Muscle weakness (generalized): Secondary | ICD-10-CM | POA: Diagnosis not present

## 2019-10-03 NOTE — Addendum Note (Signed)
Addended by: CAIRRIKIER DAVIDSON, Esgar Barnick M on: 10/03/2019 08:01 AM   Modules accepted: Orders  

## 2019-10-04 ENCOUNTER — Other Ambulatory Visit: Payer: Self-pay

## 2019-10-04 ENCOUNTER — Other Ambulatory Visit: Payer: Self-pay | Admitting: Rheumatology

## 2019-10-04 DIAGNOSIS — M0579 Rheumatoid arthritis with rheumatoid factor of multiple sites without organ or systems involvement: Secondary | ICD-10-CM

## 2019-10-04 DIAGNOSIS — Z79899 Other long term (current) drug therapy: Secondary | ICD-10-CM | POA: Diagnosis not present

## 2019-10-04 NOTE — Telephone Encounter (Signed)
Last Visit:07/28/2019 Next Visit:12/28/2019 Labs: 06/20/2019 CBC and CMP are stable.  Patient advised she is due to update labs.  Okay to refill 30 day supply Olumiant?

## 2019-10-04 NOTE — Telephone Encounter (Signed)
Okay to give 30-day supply with 2 refills.  Patient had labs drawn today.

## 2019-10-05 DIAGNOSIS — M6289 Other specified disorders of muscle: Secondary | ICD-10-CM | POA: Diagnosis not present

## 2019-10-05 DIAGNOSIS — M6281 Muscle weakness (generalized): Secondary | ICD-10-CM | POA: Diagnosis not present

## 2019-10-05 DIAGNOSIS — M62838 Other muscle spasm: Secondary | ICD-10-CM | POA: Diagnosis not present

## 2019-10-05 DIAGNOSIS — M7061 Trochanteric bursitis, right hip: Secondary | ICD-10-CM | POA: Diagnosis not present

## 2019-10-05 NOTE — Progress Notes (Signed)
GFR is low and is stable.  MCV is elevated.  Patient is on 123456 and folic acid.  We may consider obtaining 123456 and folic acid with her next labs.

## 2019-10-06 ENCOUNTER — Telehealth: Payer: Self-pay | Admitting: Pharmacist

## 2019-10-06 ENCOUNTER — Telehealth: Payer: Medicare Other

## 2019-10-06 ENCOUNTER — Other Ambulatory Visit: Payer: Self-pay | Admitting: Rheumatology

## 2019-10-06 NOTE — Telephone Encounter (Signed)
  Chronic Care Management   Outreach Note  10/06/2019 Name: Sherri [name] MRN: [id number] DOB: 1941/04/28  Referred by: Binnie Rail, MD Reason for referral : Chronic Care Management (Reschedule appt)   An unsuccessful telephone outreach was attempted today. The patient was referred to the pharmacist for assistance with care management and care coordination.    Attempted to reschedule initial phone visit today due to pharmacist being out of office due illness. Left message for patient to return call: [place] [employer] Scheduler

## 2019-10-06 NOTE — Telephone Encounter (Signed)
Last Visit:07/28/2019 Next Visit:12/28/2019  Current Dose per office note on 07/28/19: prednisone6mg  daily.   Okay to refill per Dr. Estanislado Pandy

## 2019-10-07 LAB — CBC WITH DIFFERENTIAL/PLATELET
Absolute Monocytes: 410 cells/uL (ref 200–950)
Basophils Absolute: 18 cells/uL (ref 0–200)
Basophils Relative: 0.4 %
Eosinophils Absolute: 0 cells/uL — ABNORMAL LOW (ref 15–500)
Eosinophils Relative: 0 %
HCT: 36.2 % (ref 35.0–45.0)
Hemoglobin: 12.5 g/dL (ref 11.7–15.5)
Lymphs Abs: 527 cells/uL — ABNORMAL LOW (ref 850–3900)
MCH: 35.1 pg — ABNORMAL HIGH (ref 27.0–33.0)
MCHC: 34.5 g/dL (ref 32.0–36.0)
MCV: 101.7 fL — ABNORMAL HIGH (ref 80.0–100.0)
MPV: 9.3 fL (ref 7.5–12.5)
Monocytes Relative: 9.1 %
Neutro Abs: 3546 cells/uL (ref 1500–7800)
Neutrophils Relative %: 78.8 %
Platelets: 330 10*3/uL (ref 140–400)
RBC: 3.56 10*6/uL — ABNORMAL LOW (ref 3.80–5.10)
RDW: 14.5 % (ref 11.0–15.0)
Total Lymphocyte: 11.7 %
WBC: 4.5 10*3/uL (ref 3.8–10.8)

## 2019-10-07 LAB — COMPLETE METABOLIC PANEL WITH GFR
AG Ratio: 1.9 (calc) (ref 1.0–2.5)
ALT: 18 U/L (ref 6–29)
AST: 23 U/L (ref 10–35)
Albumin: 4.2 g/dL (ref 3.6–5.1)
Alkaline phosphatase (APISO): 49 U/L (ref 37–153)
BUN/Creatinine Ratio: 15 (calc) (ref 6–22)
BUN: 14 mg/dL (ref 7–25)
CO2: 26 mmol/L (ref 20–32)
Calcium: 9.3 mg/dL (ref 8.6–10.4)
Chloride: 101 mmol/L (ref 98–110)
Creat: 0.96 mg/dL — ABNORMAL HIGH (ref 0.60–0.93)
GFR, Est African American: 66 mL/min/{1.73_m2} (ref >=60)
GFR, Est Non African American: 57 mL/min/{1.73_m2} — ABNORMAL LOW (ref >=60)
Globulin: 2.2 g/dL (calc) (ref 1.9–3.7)
Glucose, Bld: 111 mg/dL — ABNORMAL HIGH (ref 65–99)
Potassium: 4.7 mmol/L (ref 3.5–5.3)
Sodium: 138 mmol/L (ref 135–146)
Total Bilirubin: 0.5 mg/dL (ref 0.2–1.2)
Total Protein: 6.4 g/dL (ref 6.1–8.1)

## 2019-10-07 LAB — QUANTIFERON-TB GOLD PLUS
Mitogen-NIL: 5.19 IU/mL
NIL: 0.03 IU/mL
QuantiFERON-TB Gold Plus: NEGATIVE
TB1-NIL: 0 IU/mL
TB2-NIL: 0 IU/mL

## 2019-10-07 NOTE — Progress Notes (Signed)
TB gold is negative.

## 2019-10-10 ENCOUNTER — Telehealth: Payer: Self-pay | Admitting: Internal Medicine

## 2019-10-10 NOTE — Progress Notes (Signed)
°  Chronic Care Management   Note  10/10/2019 Name: Sherri [name] MRN: [id number] DOB: [birth year]  Sherri [name] is a 79 y.o. year old female who is a primary care patient of Burns, Claudina Lick, MD. I reached out to Sherri [name] by phone today in response to a referral sent by [name] PCP, Sherri Rail, MD.   [name] was given information about Chronic Care Management services today including:  1. CCM service includes personalized support from designated clinical staff supervised by her physician, including individualized plan of care and coordination with other care providers 2. 24/7 contact phone numbers for assistance for urgent and routine care needs. 3. Service will only be billed when office clinical staff spend 20 minutes or more in a month to coordinate care. 4. Only one practitioner may furnish and bill the service in a calendar month. 5. The patient may stop CCM services at any time (effective at the end of the month) by phone call to the office staff.   Patient agreed to services and verbal consent obtained.    Chronic Care Management   Note  10/10/2019 Name: [name] MRN: [id number] DOB: 1941-05-05  Sherri [name] is a 79 y.o. year old female who is a primary care patient of Burns, Claudina Lick, MD. I reached out to Sherri [name] by phone today in response to a referral sent by Ms. [name] PCP, Sherri Rail, MD.   [name] was given information about Chronic Care Management services today including:  6. CCM service includes personalized support from designated clinical staff supervised by her physician, including individualized plan of care and coordination with other care providers 7. 24/7 contact phone numbers for assistance for urgent and routine care needs. 8. Service will only be billed when office clinical staff spend 20 minutes or more in a month to coordinate care. 9. Only one practitioner may furnish and bill the  service in a calendar month. 10. The patient may stop CCM services at any time (effective at the end of the month) by phone call to the office staff.   Patient agreed to services and verbal consent obtained.   This note is not being shared with the patient for the following reason: To respect privacy (The patient or proxy has requested that the information not be shared).  Follow up plan:   [name] [employer] Scheduler  Follow up plan:   SIGNATURE

## 2019-10-11 MED FILL — OLUMIANT 2 MG TABS: 2 | 30 days supply | Qty: 30 | Fill #0

## 2019-10-13 DIAGNOSIS — M6281 Muscle weakness (generalized): Secondary | ICD-10-CM | POA: Diagnosis not present

## 2019-10-13 DIAGNOSIS — M6289 Other specified disorders of muscle: Secondary | ICD-10-CM | POA: Diagnosis not present

## 2019-10-13 DIAGNOSIS — M62838 Other muscle spasm: Secondary | ICD-10-CM | POA: Diagnosis not present

## 2019-10-19 DIAGNOSIS — M6281 Muscle weakness (generalized): Secondary | ICD-10-CM | POA: Diagnosis not present

## 2019-10-19 DIAGNOSIS — M7061 Trochanteric bursitis, right hip: Secondary | ICD-10-CM | POA: Diagnosis not present

## 2019-10-19 DIAGNOSIS — M62838 Other muscle spasm: Secondary | ICD-10-CM | POA: Diagnosis not present

## 2019-10-19 DIAGNOSIS — M6289 Other specified disorders of muscle: Secondary | ICD-10-CM | POA: Diagnosis not present

## 2019-10-20 NOTE — Chronic Care Management (AMB) (Signed)
Chronic Care Management Pharmacy  Name: Sherri [name]  MRN: [id number] DOB: 30-Nov-1940  Chief Complaint/ HPI  Sherri [name],  79 y.o. , female presents for their Initial CCM visit with the clinical pharmacist via telephone due to COVID-19 Pandemic.  PCP : Binnie Rail, MD  Their chronic conditions include: Hypertension, Hyperlipidemia, GERD, Depression, Anxiety, Osteoporosis and Multiple Sclerosis, Rheumatoid arthritis  Lives with husband, 18-yr-old cat on 3 different medications. Born in [place], went to school in [place], worked as Optometrist, [employer] club for 30 years. Not very active currently due to MS and RA. Gets overheated easily with MS. Previously was athletic/active, really misses not being able to do everything she wanted to do.   Office Visits: 03/25/19 Dr Quay Burow OV: swelling of L cheek, ordered CT - no evidence of infection.   04/28/19 Added amlodipine 2.5 mg due to elevated BP. 05/05/19 Stopped amlodipine due to pain more controlled, BP back down.  Consult Visit: 09/26/19 NP Amy Lomax-Dr Felecia Shelling (neurology): doing well with MS, continues on pain medications. No med changes.  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  09/06/19 PA Persons (ortho surgery): R foot pain - Morton's neuroma s/p webspace injection.  08/29/19 Dr Sharol Given (ortho surgery): L foot pain - neuroma injection.  07/28/19 Dr Estanislado Pandy (rheumatology): failed multiple medications in the past, has reduced prednisone to 6 mg daily (from 9 mg). Pt had best response to [name] but it was not covered. Discussed adding plaquenil but pt declined. Decided to increase prednisone to 8 mg daily. Plan to reduce back to 6 when arthritis improves.  05/25/19 Dr Felecia Shelling (neurology): started lamotrigine for facial pain, refilled oxycodone.  Allergies  Allergen Reactions  . Darvon [Propoxyphene Hcl] Shortness Of Breath    ? Dose Related ? Lowered respirations greatly  .  Meperidine Hcl Shortness Of Breath  . Penicillins Hives and Other (See Comments)    Has patient had a PCN reaction causing immediate rash, facial/tongue/throat swelling, SOB or lightheadedness with hypotension: No SEVERE RASH INVOLVING MUCUS MEMBRANES or SKIN NECROSIS: #  #  #  YES  #  #  #  Has patient had a PCN reaction that required hospitalization No Has patient had a PCN reaction occurring within the last 10 years: No.   . Azathioprine Other (See Comments)    Weakness  . Leflunomide     unknown  . Ezetimibe-Simvastatin Nausea And Vomiting   Medications: Outpatient Encounter Medications as of 10/21/2019  Medication Sig  . atorvastatin (LIPITOR) 10 MG tablet Take 1 tablet by mouth on Mondays and Friday  . Biotin 5000 MCG TABS Take 5,000 mcg by mouth daily.   . Calcium Carbonate-Vitamin D (CALCIUM + D PO) Take 1 tablet by mouth daily.   . Carboxymethylcellul-Glycerin (LUBRICATING EYE DROPS OP) Apply 1 drop to eye daily as needed (dry eyes).  . carvedilol (COREG) 6.25 MG tablet TAKE 1 TABLET (6.25 MG TOTAL) BY MOUTH 2 (TWO) TIMES DAILY WITH A MEAL.  [name] Cholecalciferol (VITAMIN D3) 2000 units capsule Take 2,000 Units by mouth daily.  . Cyanocobalamin (VITAMIN B-12 PO) Take 1 tablet by mouth daily.  [name] denosumab (PROLIA) 60 MG/ML SOSY injection INJECT 60 MG INTO THE SKIN EVERY 6 (SIX) MONTHS.  [name] diclofenac sodium (VOLTAREN) 1 % GEL APPLY 3 GRAMS TO 3 LARGE JOINTS 3 TIMES A DAY AS NEEDED  . famotidine (PEPCID) 40 MG tablet Take 1 tablet (40 mg total) by mouth daily.  . folic acid (FOLVITE)  1 MG tablet TAKE 2 TABLETS BY MOUTH EVERY DAY  . methotrexate (RHEUMATREX) 2.5 MG tablet TAKE 6 TABLETS BY MOUTH ONCE WEEKLY. CAUTION:CHEMOTHERAPY. PROTECT FROM LIGHT.  [name] OLUMIANT 2 MG TABS tablet TAKE ONE TABLET (2MG ) BY MOUTH DAILY  . Oxycodone HCl 10 MG TABS Take 1 tablet by mouth at bedtime as needed  . predniSONE (DELTASONE) 5 MG tablet TAKE 1 TABLET BY MOUTH EVERY DAY WITH BREAKFAST  . Pyridoxine  HCl (VITAMIN B-6 PO) Take 1 tablet by mouth daily.  . traMADol (ULTRAM) 50 MG tablet TAKE 1 TABLET BY MOUTH THREE TIMES A DAY  . predniSONE (DELTASONE) 1 MG tablet TAKE 4 TABLETS (4 MG TOTAL) BY MOUTH DAILY WITH BREAKFAST. (Patient not taking: No sig reported)   No facility-administered encounter medications on file as of 10/21/2019.    Current Diagnosis/Assessment:  SDOH Interventions     Most Recent Value  SDOH Interventions  Financial Strain Interventions  Other (Comment) [RA medication is expensive, however does not qualify for PAP]     Goals Addressed            This Visit's Progress   . Pharmacy Care Plan       CARE PLAN ENTRY  Current Barriers:  . Chronic Disease Management support, education, and care coordination needs related to Hypertension, Hyperlipidemia, and Osteoporosis   Hypertension BP Readings from Last 3 Encounters:  09/26/19 127/75  07/28/19 132/73  05/25/19 (!) 150/90 .  Pharmacist Clinical Goal(s): o Over the next 180 days, patient will work with PharmD and providers to maintain BP goal <130/80 . Current regimen:  o Carvedilol 6.25 mg BID . Interventions: o Discussed BP goal and benefits of medication for prevention of heart attack and stroke . Patient self care activities - Over the next 180 days, patient will: o Check BP 1-2x per week, document, and provide at future appointments o Ensure daily salt intake < 2300 mg/day  Hyperlipidemia Lipid Panel     Component Value Date/Time   CHOL 188 08/05/2018 1025   TRIG 103 08/05/2018 1025   HDL 72 08/05/2018 1025   LDLCALC 96 08/05/2018 1025 .  Pharmacist Clinical Goal(s): o Over the next 180 days, patient will work with PharmD and providers to maintain LDL goal < 100 . Current regimen:  o Atorvastatin 10 mg Mon and Fri . Interventions: o Discussed cholesterol goal and benefits of statin medication for prevention of heart attack and stroke . Patient self care activities - Over the next 180 days,  patient will: o Continue medication as prescribed o Continue low cholesterol diet  Osteoporosis . Pharmacist Clinical Goal(s) o Over the next 180 days, patient will work with PharmD and providers to optimize bone health . Current regimen:  o Calcium carbonate 600 mg o Vitamin D 2000 IU daily o Prolia 60 mg q6 months (last given 06/09/19) . Interventions: o Discussed the human body can absorb only 600 mg of calcium at a time; counseled to take calcium twice a day to get 1200 mg total each day . Patient self care activities - Over the next 180 days, patient will: o Take calcium 600 mg twice a day (separated by 8-12 hrs)  Medication management . Pharmacist Clinical Goal(s): o Over the next 180 days, patient will work with PharmD and providers to maintain optimal medication adherence . [facility] . Interventions o Comprehensive medication review performed. o Continue current medication management strategy . Patient self care activities - Over the next 180 days, patient will:  o Focus on medication adherence by patient report o Take medications as prescribed o Report any questions or concerns to PharmD and/or provider(s)  Initial goal documentation       Hypertension   BP goal is:  <130/80  Office blood pressures are  BP Readings from Last 3 Encounters:  09/26/19 127/75  07/28/19 132/73  05/25/19 (!) 150/90   Pulse Readings from Last 3 Encounters:  09/26/19 72  07/28/19 62  05/25/19 69   Kidney Function Lab Results  Component Value Date/Time   CREATININE 0.96 (H) 10/04/2019 01:57 PM   CREATININE 1.01 (H) 06/20/2019 01:35 PM   CREATININE 1.10 (H) 05/30/2019 02:05 PM   CREATININE 0.85 05/02/2019 01:44 PM   GFR 69.54 03/30/2019 12:46 PM   GFRNONAA 57 (L) 10/04/2019 01:57 PM   GFRAA 66 10/04/2019 01:57 PM   Patient checks BP at home 1-2x per week Patient home BP readings are ranging: 106/70 - 120/70, HR ~70  Patient has failed these meds in the past:  amlodipine, furosemide, metoprolol, Bystolic, ramipril Patient is currently controlled on the following medications:  . Carvedilol 6.25 mg BID  We discussed diet and exercise extensively; benefits of BP control for CV risk reduction.  Plan  Continue current medications and control with diet and exercise     Hyperlipidemia   Lipid Panel     Component Value Date/Time   CHOL 188 08/05/2018 1025   TRIG 103 08/05/2018 1025   HDL 72 08/05/2018 1025   LDLCALC 96 08/05/2018 1025   LDLDIRECT 122.9 11/15/2012 1401    The 10-year ASCVD risk score Mikey Bussing DC Jr., et al., 2013) is: 25.9%   Values used to calculate the score:     Age: 38 years     Sex: Female     Is Non-Hispanic African American: No     Diabetic: No     Tobacco smoker: No     Systolic Blood Pressure: [id number]     Is BP treated: Yes     HDL Cholesterol: 72 mg/dL     Total Cholesterol: 188 mg/dL   Patient has failed these meds in past: n/a Patient is currently controlled on the following medications:  . Atorvastatin 10 mg Mon and Fri  We discussed:  diet and exercise extensively; RA medications contribute to elevated lipids; benefits of statin for ASCVD prevention  Plan  Continue current medications and control with diet and exercise  Osteoporosis   Last DEXA Scan: 01/11/2018  T-Score femoral neck: -3.4  T-Score total hip: -2.4  T-Score lumbar spine: -1.0  T-Score forearm radius: -3.9  10-year probability of major osteoporotic fracture: n/a  10-year probability of hip fracture: n/a  Vit D, 25-Hydroxy  Date Value Ref Range Status  08/31/2017 57 30 - 100 ng/mL Final    Patient is a candidate for pharmacologic treatment due to T-Score < -2.5 in femoral neck  Patient has failed these meds in past: alendronate Patient is currently controlled on the following medications:   Calcium carbonate 600 mg   Vitamin D 2000 IU daily  Prolia 60 mg q6 months (last given 06/09/19)   We discussed:  Recommend [phone]  units of vitamin D daily. Recommend 1200 mg of calcium daily from dietary and supplemental sources. Recommend weight-bearing and muscle strengthening exercises for building and maintaining bone density.  Plan  Continue current medications  Multiple Sclerosis   Patient has failed these meds in past: lamotrigine (never started) Patient is currently controlled on the following medications:  .  Oxycodone 10 mg HS prn  . Tramadol 50 mg TID prn - 1/2 tab 5pm, 1/2 tab at 8pm  We discussed:  Pt wants to get rid of oxycodone because she is fearful of addiction, has [facility] about [facility]. Emphasized that she is on relatively low dose just once a day; pt reports she cannot sleep without it. Encouraged her to discuss with prescriber if she truly wants to change medications. Pt feels she is "over-prescribed", she never started lamotrigine.   Plan  Continue current medications   Rheumatoid arthritis   Patient has failed these meds in past: n/a Patient is currently controlled on the following medications:  . Baricitinib (Olumiant) 2 mg daily  . Methotrexate 15 mg (2.5 mg x 6) once weekly - Wednesday . Prednisone 5 mg daily . Folic acid 1 mg -  2 tablets daily . Diclofenac 1% gel PRN  We discussed:  [name] worked best but $2000 a month. Prednisone was reduced to 5 mg from 7 mg this week as flare is resolving; Olumiant is $250 per month currently but patient does not qualify for PAP since household income > $100,000.   Plan  Continue current medications   GERD/Hoarseness   Patient has failed these meds in past: n/a Patient is currently controlled on the following medications:  . Famotidine 40 mg daily - before evening meal  We discussed:  Never had heartburn, taking for hoarseness.   Plan  Continue current medications  Health Maintenance   Vitamin B-12  Date Value Ref Range Status  06/20/2019 789 232 - 1,245 pg/mL Final  07/22/2016 961 (H) 211 - 911 pg/mL Final    Patient is currently controlled on the following medications:  [name] Vitamin B12 (dose unknown) . Biotin 5000 mcg daily . Vitamin B6 . Lubricating eye drops  We discussed: Patient is satisfied with current OTC regimen and denies issues  Plan  Continue current medications and control with diet and exercise  Medication Management   [facility] Uses pill box? Yes Pt endorses 100% compliance  We discussed: Pt likes to be in charge of her own refills. [facility] good [facility].  Plan  Continue current medication management strategy    Follow up: 6 month phone visit  Charlene Brooke, PharmD Clinical Pharmacist [facility] at [facility] [phone]

## 2019-10-21 ENCOUNTER — Other Ambulatory Visit: Payer: Self-pay

## 2019-10-21 ENCOUNTER — Ambulatory Visit: Payer: Medicare Other | Admitting: Pharmacist

## 2019-10-21 DIAGNOSIS — I10 Essential (primary) hypertension: Secondary | ICD-10-CM

## 2019-10-21 DIAGNOSIS — E7849 Other hyperlipidemia: Secondary | ICD-10-CM

## 2019-10-21 DIAGNOSIS — M810 Age-related osteoporosis without current pathological fracture: Secondary | ICD-10-CM

## 2019-10-21 NOTE — Patient Instructions (Addendum)
Visit Information  Thank you for meeting with me to discuss your medications! I look forward to working with you to achieve your health care goals. Below is a summary of what we talked about during the visit:  Goals Addressed            This Visit's Progress   . Pharmacy Care Plan       CARE PLAN ENTRY  Current Barriers:  . Chronic Disease Management support, education, and care coordination needs related to Hypertension, Hyperlipidemia, and Osteoporosis   Hypertension BP Readings from Last 3 Encounters:  09/26/19 127/75  07/28/19 132/73  05/25/19 (!) 150/90 .  Pharmacist Clinical Goal(s): o Over the next 180 days, patient will work with PharmD and providers to maintain BP goal <130/80 . Current regimen:  o Carvedilol 6.25 mg BID . Interventions: o Discussed BP goal and benefits of medication for prevention of heart attack and stroke . Patient self care activities - Over the next 180 days, patient will: o Check BP 1-2x per week, document, and provide at future appointments o Ensure daily salt intake < 2300 mg/day  Hyperlipidemia Lipid Panel     Component Value Date/Time   CHOL 188 08/05/2018 1025   TRIG 103 08/05/2018 1025   HDL 72 08/05/2018 1025   LDLCALC 96 08/05/2018 1025 .  Pharmacist Clinical Goal(s): o Over the next 180 days, patient will work with PharmD and providers to maintain LDL goal < 100 . Current regimen:  o Atorvastatin 10 mg Mon and Fri . Interventions: o Discussed cholesterol goal and benefits of statin medication for prevention of heart attack and stroke . Patient self care activities - Over the next 180 days, patient will: o Continue medication as prescribed o Continue low cholesterol diet  Osteoporosis . Pharmacist Clinical Goal(s) o Over the next 180 days, patient will work with PharmD and providers to optimize bone health . Current regimen:  o Calcium carbonate 600 mg o Vitamin D 2000 IU daily o Prolia 60 mg q6 months (last given  06/09/19) . Interventions: o Discussed the human body can absorb only 600 mg of calcium at a time; counseled to take calcium twice a day to get 1200 mg total each day . Patient self care activities - Over the next 180 days, patient will: o Take calcium 600 mg twice a day (separated by 8-12 hrs)  Medication management . Pharmacist Clinical Goal(s): o Over the next 180 days, patient will work with PharmD and providers to maintain optimal medication adherence . [facility] . Interventions o Comprehensive medication review performed. o Continue current medication management strategy . Patient self care activities - Over the next 180 days, patient will: o Focus on medication adherence by patient report o Take medications as prescribed o Report any questions or concerns to PharmD and/or provider(s)  Initial goal documentation       Sherri [name] was given information about Chronic Care Management services today including:  1. CCM service includes personalized support from designated clinical staff supervised by her physician, including individualized plan of care and coordination with other care providers 2. 24/7 contact phone numbers for assistance for urgent and routine care needs. 3. Standard insurance, coinsurance, copays and deductibles apply for chronic care management only during months in which we provide at least 20 minutes of these services. Most insurances cover these services at 100%, however patients may be responsible for any copay, coinsurance and/or deductible if applicable. This service may help you avoid the need for  more expensive face-to-face services. 4. Only one practitioner may furnish and bill the service in a calendar month. 5. The patient may stop CCM services at any time (effective at the end of the month) by phone call to the office staff.  Patient agreed to services and verbal consent obtained.   The patient verbalized understanding of instructions  provided today and declined a print copy of patient instruction materials.  Telephone follow up appointment with pharmacy team member scheduled for: 6 months  Charlene Brooke, PharmD Clinical Pharmacist [facility] at [facility] [phone]    Calcium Content in Foods Calcium is the most abundant mineral in your body. Most of your body's calcium supply is stored in your bones and teeth. Calcium helps many parts of the body function normally, including:  Blood and blood vessels.  Nerves.  Hormones.  Muscles.  Bones and teeth. When your calcium stores are low, you may be at risk for low bone mass, bone loss, and broken bones (fractures). When you get enough calcium, it helps to support strong bones and teeth throughout your life. Calcium is especially important for:  Children during growth spurts.  Girls during adolescence.  Women who are pregnant or breastfeeding.  Women after their menstrual cycle stops (postmenopause).  Women whose menstrual cycle has stopped due to anorexia nervosa or regular intense exercise.  People who cannot eat or digest dairy products.  Vegans. What are tips for getting more calcium? General information  Try to get most of your calcium from food. Eat foods that are high in calcium.  Some people may benefit from taking calcium supplements. Check with your health care provider or diet and nutrition specialist (dietitian) before starting any calcium supplements. Calcium supplements may interact with certain medicines. Too much calcium may cause other health problems, like constipation and kidney stones.  For the body to absorb calcium, it needs vitamin D. Sources of vitamin D include: ? Skin exposure to direct sunlight. ? Foods, such as egg yolks, liver, saltwater fish, and fortified milk. ? Vitamin D supplements. Check with your health care provider or dietitian before starting any vitamin D supplements. What foods are high in  calcium?  High-calcium foods are those that contain more than 100 milligrams (mg) of calcium per serving. Fruits  Fortified orange or other fruit juice, 300 mg per 8 oz serving. Vegetables  Collard greens, 360 mg per 8 oz serving.  Kale, 180 mg per 8 oz serving.  Bok choy, 160 mg per 8 oz serving. Grains  Fortified ready-to-eat cereals, 100-1,000 mg per 8 oz serving.  Fortified frozen waffles, 200 mg in two waffles. Meats and other proteins  Sardines, canned with bones, 325 mg per 3 oz serving.  Salmon, canned with bones, 180 mg per 3 oz serving.  Canned shrimp, 125 mg per 3 oz serving.  Baked beans, 160 mg per 4 oz serving. Dairy  Yogurt, plain, low-fat, 310 mg per 6 oz serving.  Milk, 300 mg per 8 oz serving.  American cheese, 195 mg per 1 oz serving.  Cheddar cheese, 205 mg per 1 oz serving.  Cottage cheese 2%, 105 mg per 4 oz serving.  Fortified soy, rice, or almond milk, 300 mg per 8 oz serving. The items listed above may not be a complete list of foods high in calcium. Actual amounts of calcium may be different depending on processing. Contact a dietitian for more information. What foods are lower in calcium? Foods lower in calcium are those that contain  50 mg of calcium or less per serving. Fruits  Apple, about 6 mg in one apple.  Banana, about 12 mg in one banana. Vegetables  Lettuce, 19 mg per 2 oz serving.  Tomato, about 11 mg in one tomato. Grains  Rice, 4 mg per 6 oz serving.  Boiled potatoes, 14 mg per 8 oz serving.  White bread, 6 mg in one slice. Meats and other proteins  Egg, 27 mg per 2 oz serving.  Red meat, 7 mg per 4 oz serving.  Chicken, 17 mg per 4 oz serving.  Fish, cod or trout, 20 mg per 4 oz serving. The items listed above may not be a complete list of foods lower in calcium. Actual amounts of calcium may be different depending on processing. Contact a dietitian for more information. Summary  Calcium is an important  mineral in the body because it affects many functions. Getting enough calcium helps support strong bones and teeth throughout your life.  Try to get most of your calcium from food.  Calcium supplements may interact with certain medicines. Check with your health care provider before starting any calcium supplements. This information is not intended to replace advice given to you by your health care provider. Make sure you discuss any questions you have with your health care provider. Document Revised: 05/05/2017 Document Reviewed: 05/05/2017 Elsevier Patient Education  2020 [employer].

## 2019-10-25 ENCOUNTER — Other Ambulatory Visit: Payer: Self-pay | Admitting: Family Medicine

## 2019-10-25 DIAGNOSIS — H1131 Conjunctival hemorrhage, right eye: Secondary | ICD-10-CM | POA: Diagnosis not present

## 2019-10-25 MED ORDER — OXYCODONE HCL 10 MG PO TABS: ORAL_TABLET | ORAL | 0 refills | Status: DC

## 2019-10-25 NOTE — Telephone Encounter (Signed)
Pt has called for a refill on her Oxycodone HCl 10 MG TABS [facility] #[id number]

## 2019-10-25 NOTE — Addendum Note (Signed)
Addended by: Brandon Melnick on: 10/25/2019 02:48 PM   Modules accepted: Orders

## 2019-10-26 ENCOUNTER — Ambulatory Visit (INDEPENDENT_AMBULATORY_CARE_PROVIDER_SITE_OTHER): Payer: Medicare Other | Admitting: Internal Medicine

## 2019-10-26 ENCOUNTER — Other Ambulatory Visit: Payer: Self-pay

## 2019-10-26 ENCOUNTER — Encounter: Payer: Self-pay | Admitting: Internal Medicine

## 2019-10-26 VITALS — BP 134/74 | HR 73 | Temp 99.7°F | Resp 16 | Ht 62.5 in | Wt 116.0 lb

## 2019-10-26 DIAGNOSIS — L299 Pruritus, unspecified: Secondary | ICD-10-CM | POA: Diagnosis not present

## 2019-10-26 DIAGNOSIS — L539 Erythematous condition, unspecified: Secondary | ICD-10-CM

## 2019-10-26 DIAGNOSIS — M25521 Pain in right elbow: Secondary | ICD-10-CM

## 2019-10-26 DIAGNOSIS — T364X5A Adverse effect of tetracyclines, initial encounter: Secondary | ICD-10-CM

## 2019-10-26 DIAGNOSIS — L03113 Cellulitis of right upper limb: Secondary | ICD-10-CM | POA: Insufficient documentation

## 2019-10-26 DIAGNOSIS — K30 Functional dyspepsia: Secondary | ICD-10-CM

## 2019-10-26 DIAGNOSIS — M79601 Pain in right arm: Secondary | ICD-10-CM | POA: Diagnosis not present

## 2019-10-26 MED ORDER — CEPHALEXIN 500 MG PO CAPS: 500.0000 mg | ORAL_CAPSULE | Freq: Three times a day (TID) | ORAL | 0 refills | Status: DC

## 2019-10-26 NOTE — Patient Instructions (Addendum)
  Medications reviewed and updated.  Changes include :   Keflex   Your prescription(s) have been submitted to your pharmacy. Please take as directed and contact our office if you believe you are having problem(s) with the medication(s).    Please call if there is no improvement in your symptoms.

## 2019-10-26 NOTE — Progress Notes (Signed)
Subjective:    Patient ID: Sherri [name], female    DOB: [birth year], 79 y.o.   MRN: [id number]  HPI The patient is here for an acute visit.   2 weeks ago - working in yard, got very hot  - developed a rash on right arms around that time. Right arm itches and Sherri [name]/hurts, redness has gotten larger.  Elbow is sore - can not put any pressure on it.  Able to bend elbow.  She is very fatigued - this is the worse fatigue she has had.  Her joints all hurt.  She saw her rheumatologist last month and blood work at that time was normal.     She tried the [employer], but had to stop due other eye inury.  She does not think it helped.  She is on prednisone chronically.     Medications and allergies reviewed with patient and updated if appropriate.  Patient Active Problem List   Diagnosis Date Noted  . Cellulitis of right upper extremity 10/26/2019  . Cervical radiculopathy 06/28/2019  . Carpal tunnel syndrome on both sides 06/28/2019  . Right sided sciatica 04/14/2018  . Trochanteric bursitis of right hip 04/14/2018  . S/P cervical spinal fusion 11/10/2017  . Swelling of left foot 10/26/2017  . Dysphagia 10/26/2017  . Left ankle swelling 10/26/2017  . Localized swelling, mass, and lump of head 08/18/2017  . Gastroesophageal reflux disease 08/06/2017  . Easy bruising 08/06/2017  . Anxiety 04/24/2017  . Mid back pain 03/26/2017  . Left sided chest pain 03/25/2017  . Dyspnea on exertion 03/14/2017  . Hyperkalemia 03/13/2017  . Submandibular gland swelling 01/08/2017  . High risk medication use 09/02/2016  . Urinary frequency 09/02/2016  . Piriformis muscle pain 09/02/2016  . Fatigue 07/22/2016  . Hair loss 07/22/2016  . Right hip pain 04/08/2016  . Right knee pain 02/25/2016  . Dyspepsia 04/05/2015  . Numbness 11/14/2014  . Biceps tendon tear 10/10/2014  . Insomnia 10/10/2014  . Gait disturbance 06/13/2014  . 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 OP  (osteoporosis) 01/18/2014  . Other long term (current) drug therapy 01/18/2014  . Other dysphagia 12/29/2013  . Hoarseness 12/29/2013  . 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 Personal history of colonic polyps 07/03/2010  . Nocturia 05/28/2010  . THYROID NODULE 05/17/2008  . Multiple sclerosis ([place]) 05/17/2008  . Chronic fatigue 03/30/2007  . Hyperlipidemia 08/20/2006  . Essential hypertension 08/20/2006  . Rheumatoid arthritis ([place]) 08/20/2006  . CERVICAL CANCER, HX OF 08/20/2006  . GASTRIC ULCER, HX OF 08/20/2006    Current Outpatient Medications on File Prior to Visit  Medication Sig Dispense Refill  . atorvastatin (LIPITOR) 10 MG tablet Take 1 tablet by mouth on Mondays and Friday 27 tablet 1  . Biotin 5000 MCG TABS Take 5,000 mcg by mouth daily.     . Calcium Carbonate-Vitamin D (CALCIUM + D PO) Take 1 tablet by mouth daily.     . Carboxymethylcellul-Glycerin (LUBRICATING EYE DROPS OP) Apply 1 drop to eye daily as needed (dry eyes).    . carvedilol (COREG) 6.25 MG tablet TAKE 1 TABLET (6.25 MG TOTAL) BY MOUTH 2 (TWO) TIMES DAILY WITH A MEAL. 180 tablet 1  . Cholecalciferol (VITAMIN D3) 2000 units capsule Take 2,000 Units by mouth daily.    . Cyanocobalamin (VITAMIN B-12 PO) Take 1 tablet by mouth daily.    [name] denosumab (PROLIA) 60 MG/ML SOSY injection INJECT 60 MG INTO THE SKIN EVERY  6 (SIX) MONTHS. 1 mL 0  . diclofenac sodium (VOLTAREN) 1 % GEL APPLY 3 GRAMS TO 3 LARGE JOINTS 3 TIMES A DAY AS NEEDED 100 g 0  . famotidine (PEPCID) 40 MG tablet Take 1 tablet (40 mg total) by mouth daily. 90 tablet 1  . folic acid (FOLVITE) 1 MG tablet TAKE 2 TABLETS BY MOUTH EVERY DAY 180 tablet 3  . methotrexate (RHEUMATREX) 2.5 MG tablet TAKE 6 TABLETS BY MOUTH ONCE WEEKLY. CAUTION:CHEMOTHERAPY. PROTECT FROM LIGHT. 72 tablet 0  . OLUMIANT 2 MG TABS tablet TAKE ONE TABLET (2MG ) BY MOUTH DAILY 30 tablet 2  . Oxycodone HCl 10 MG TABS Take 1 tablet by mouth at bedtime as needed 30  tablet 0  . predniSONE (DELTASONE) 1 MG tablet TAKE 4 TABLETS (4 MG TOTAL) BY MOUTH DAILY WITH BREAKFAST. 360 tablet 0  . predniSONE (DELTASONE) 5 MG tablet TAKE 1 TABLET BY MOUTH EVERY DAY WITH BREAKFAST 30 tablet 0  . Pyridoxine HCl (VITAMIN B-6 PO) Take 1 tablet by mouth daily.    . traMADol (ULTRAM) 50 MG tablet TAKE 1 TABLET BY MOUTH THREE TIMES A DAY 90 tablet 5   No current facility-administered medications on file prior to visit.    Past Medical History:  Diagnosis Date  . Anxiety    takes Valium daily as needed  . Basal cell carcinoma 01/21/1988   left nostril (MOHS), sup-right calf (CX35FU)  . Basal cell carcinoma 03/22/1996   right calf (CX35FU)  . Basal cell carcinoma 03/31/1995   left wing nose  . Bruises easily    d/t meds  . Cancer ([place])    basal cell ca, in situ- uterine   . Cataracts, bilateral   . Chronic back pain   . Dizziness    r/t to meds  . GERD (gastroesophageal reflux disease)    no meds on a regular basis but will take Tums if needed  . Headache(784.0)    r/t neck issues  . History of bronchitis 6-70yrs ago  . History of colon polyps    benign  . Hyperlipidemia    takes Atorvastatin on Mondays and Fridays  . Hypertension    takes Ramipril daily  . Joint pain   . Joint swelling   . Multiple sclerosis ([place])   . Neuromuscular disorder ([place])    Dr. Park Liter- [facility], follows M.S.  . Nocturia   . OA (osteoarthritis)    Dr Jani Gravel weekly, RA- hands- knees- feet   . PONV (postoperative nausea and vomiting)    trouble urinating after surgery in 2014  . Rheumatoid arthritis(714.0)    Dr Estanislado Pandy takes [name] daily  . Right wrist fracture   . Skin cancer   . Squamous cell carcinoma of skin 11/02/2001   in situ-right knee (cx53fu)  . Squamous cell carcinoma of skin 11/12/2011   in situ-left forearm (CX35FU), in situ-left foot (CX35FU)  . Squamous cell carcinoma of skin 01/22/2012   in situ-left lower forearm (txpbx)  .  Squamous cell carcinoma of skin 11/17/2012   right shin (txpbx)  . Squamous cell carcinoma of skin 10/28/2013   in situ-right shoulder (CX35FU)  . Squamous cell carcinoma of skin 04/28/2014   well diff-right shin (txpbx)  . Squamous cell carcinoma of skin 11/02/2014   in situ-Left shin (txpbx)  . Squamous cell carcinoma of skin 12/15/2014   in situ-Left hand (txpbx), bowens-left side chest (txpbx)  . Squamous cell carcinoma of skin 05/09/2015   in situ-left hand (  txpbx)   . Squamous cell carcinoma of skin 05/07/2016   in situ-left inner shin,ant, in situ-left inner shin, post, in situ-left outer forearm, in situ-right knuckle  . Squamous cell carcinoma of skin 03/1302018   in situ-left shoulder (txpbx), in situ-right inner shin (txpbx), in situ-top of left foot (txpbx), KA- left forearm (txpbx)  . Squamous cell carcinoma of skin 12/02/2016   in situ-left inner shin (txpbx)  . Squamous cell carcinoma of skin 03/04/2017   in situ-left outer sup, shin (txpbx), in situ- right 2nd knuckle finger (txpbx), in situ- Left wrist (txpbx)  . Squamous cell carcinoma of skin 04/06/2018   in situ-above left knee inner (txpbx)  . Squamous cell carcinoma of skin 04/21/2018   in situ-right top hand (txpbx)  . Squamous cell carcinoma of skin 07/08/2018   in situ-right lower inner shin (txpbx)  . Squamous cell carcinoma of skin 04/27/2019   in situ- Left neck(CX35FU), In situ- right neck (CX35FU)  . Urinary retention    sees Dr.Wrenn about 2 times a yr    Past Surgical History:  Procedure Laterality Date  . ABDOMINAL HYSTERECTOMY     1985  . APPENDECTOMY     with TAH  . BACK SURGERY     several  . BREAST BIOPSY Right    Results were negative  . BROW LIFT Bilateral 10/02/2016   Procedure: BILATERAL LOWER LID BLEPHAROPLASTY;  Surgeon: Clista Bernhardt, MD;  Location: [facility];  Service: Plastics;  Laterality: Bilateral;  . CARPAL TUNNEL RELEASE Right   . cataracts    . CERVICAL FUSION     Dr  Joya Salm  . CERVICAL FUSION  12/31/2012   Dr Joya Salm  . CHEST TUBE INSERTION     for traumatic Pneumothorax  . COLONOSCOPY  07/16/2010   normal   . COLONOSCOPY W/ POLYPECTOMY  1997   negative since; Dr Deatra Ina  . ESOPHAGOGASTRODUODENOSCOPY  07/16/2010   normal  . eye lid raise    . EYE SURGERY Bilateral    cataracts removed - /w IOL  . JOINT REPLACEMENT    . LUMBAR FUSION     Dr Joya Salm  . NASAL SINUS SURGERY    . POSTERIOR CERVICAL FUSION/FORAMINOTOMY N/A 12/31/2012   Procedure: POSTERIOR LATERAL CERVICAL FUSION/FORAMINOTOMY LEVEL 1 CERVICAL THREE-FOUR WITH LATERAL MASS SCREWS;  Surgeon: Floyce Stakes, MD;  Location: [facility];  Service: Neurosurgery;  Laterality: N/A;  . PTOSIS REPAIR Bilateral 10/02/2016   Procedure: INTERNAL PTOSIS REPAIR;  Surgeon: Clista Bernhardt, MD;  Location: [facility];  Service: Plastics;  Laterality: Bilateral;  . SKIN BIOPSY    . THYROID SURGERY     R lobe removed- 1972, has grown back - CT last done- 2018  . TOTAL HIP ARTHROPLASTY  12/22/2011   Procedure: TOTAL HIP ARTHROPLASTY;  Surgeon: Kerin Salen, MD;  Location: [facility];  Service: Orthopedics;  Laterality: Left;  . TOTAL SHOULDER ARTHROPLASTY    . TUBAL LIGATION      Social History   Socioeconomic History  . Marital status: Married    Spouse name: Not on file  . Number of children: Not on file  . Years of education: Not on file  . Highest education level: Not on file  Occupational History  . Occupation: retired  Tobacco Use  . Smoking status: Former Smoker    Packs/day: 2.50    Years: 20.00    Pack years: 50.00    Types: Cigarettes    Quit date: 05/26/1981  Years since quitting: 38.4  . Smokeless tobacco: Never Used  . Tobacco comment: Smoked [phone], up to 2.5 ppd  Substance and Sexual Activity  . Alcohol use: Yes    Alcohol/week: 14.0 standard drinks    Types: 14 Cans of beer per week    Comment: 2% beer  . Drug use: Never  . Sexual activity: Yes  Other Topics Concern  . Not  on file  Social History Narrative  . Not on file   Social Determinants of Health   Financial Resource Strain: Low Risk   . Difficulty of Paying Living Expenses: Not very hard  Food Insecurity:   . Worried About Charity fundraiser in the Last Year:   . Arboriculturist in the Last Year:   Transportation Needs:   . Film/video editor (Medical):   [name] Lack of Transportation (Non-Medical):   Physical Activity:   . Days of Exercise per Week:   . Minutes of Exercise per Session:   Stress:   . Feeling of Stress :   Social Connections:   . Frequency of Communication with Friends and Family:   . Frequency of Social Gatherings with Friends and Family:   . Attends Religious Services:   . Active Member of Clubs or Organizations:   . Attends Archivist Meetings:   [name] Marital Status:     Family History  Problem Relation Age of Onset  . Cancer Mother        pancreatic  . Diabetes Mother   . Heart disease Father        Rheumatic  . Cancer Father        ? stomach  . Cancer Sister        stomach  . Cancer Maternal Aunt        X 4; ? primary  . Heart disease Paternal Aunt   . Cancer Maternal Grandmother        cervical  . Colon cancer Other        Aunts  . Multiple sclerosis Daughter   . Hypertension Neg Hx   . Stroke Neg Hx     Review of Systems  Constitutional: Positive for fatigue. Negative for chills and fever.  Musculoskeletal: Positive for arthralgias and myalgias.  Neurological: Negative for light-headedness and headaches.       Objective:   Vitals:   10/26/19 1550  BP: 134/74  Pulse: 73  Resp: 16  Temp: 99.7 F (37.6 C)  SpO2: 92%   BP Readings from Last 3 Encounters:  10/26/19 134/74  09/26/19 127/75  07/28/19 132/73   Wt Readings from Last 3 Encounters:  10/26/19 116 lb (52.6 kg)  09/26/19 118 lb (53.5 kg)  09/01/19 118 lb (53.5 kg)   Body mass index is 20.88 kg/m.   Physical Exam Constitutional:      General: She is not in acute  distress.    Appearance: Normal appearance. She is not ill-appearing.  HENT:     Head: Normocephalic and atraumatic.  Musculoskeletal:     Comments: FROM of elbow w/o increased pain  Skin:    General: Skin is warm and dry.     Comments: Erythema right posterior elbow down to middle forearm - slightly warm to touch, ? Mild swelling, tender to touch  Neurological:     Mental Status: She is alert.            Assessment & Plan:    See Problem List for Assessment  and Plan of chronic medical problems.    This visit occurred during the SARS-CoV-2 public health emergency.  Safety protocols were in place, including screening questions prior to the visit, additional usage of staff PPE, and extensive cleaning of exam room while observing appropriate contact time as indicated for disinfecting solutions.

## 2019-10-26 NOTE — Assessment & Plan Note (Signed)
Acute Redness, pain, burning, itching R elbow region and forearm - getting worse ? Cellulitis - does not looks like a typical cellulitis Doxycycline has caused stomach upset -- so will  Start keflex  - if improving may need to extend for full 14 day - she will let me know if there is improvement

## 2019-10-28 ENCOUNTER — Ambulatory Visit: Payer: Medicare Other | Admitting: Surgical

## 2019-10-28 ENCOUNTER — Other Ambulatory Visit: Payer: Self-pay

## 2019-10-28 ENCOUNTER — Ambulatory Visit: Payer: Medicare Other | Admitting: Rheumatology

## 2019-10-28 ENCOUNTER — Emergency Department (HOSPITAL_COMMUNITY)
Admission: EM | Admit: 2019-10-28 | Discharge: 2019-10-29 | Discharge status: Against Medical Advice | Payer: Medicare Other

## 2019-10-28 ENCOUNTER — Encounter: Payer: Self-pay | Admitting: Surgical

## 2019-10-28 ENCOUNTER — Telehealth: Payer: Self-pay | Admitting: Rheumatology

## 2019-10-28 ENCOUNTER — Encounter: Payer: Self-pay | Admitting: Physician Assistant

## 2019-10-28 VITALS — BP 123/75 | HR 67 | Resp 12 | Ht 64.0 in | Wt 119.4 lb

## 2019-10-28 DIAGNOSIS — F5101 Primary insomnia: Secondary | ICD-10-CM

## 2019-10-28 DIAGNOSIS — R5383 Other fatigue: Secondary | ICD-10-CM

## 2019-10-28 DIAGNOSIS — Z8639 Personal history of other endocrine, nutritional and metabolic disease: Secondary | ICD-10-CM

## 2019-10-28 DIAGNOSIS — M0579 Rheumatoid arthritis with rheumatoid factor of multiple sites without organ or systems involvement: Secondary | ICD-10-CM

## 2019-10-28 DIAGNOSIS — M810 Age-related osteoporosis without current pathological fracture: Secondary | ICD-10-CM

## 2019-10-28 DIAGNOSIS — Z79899 Other long term (current) drug therapy: Secondary | ICD-10-CM

## 2019-10-28 DIAGNOSIS — M1611 Unilateral primary osteoarthritis, right hip: Secondary | ICD-10-CM

## 2019-10-28 DIAGNOSIS — L03113 Cellulitis of right upper limb: Secondary | ICD-10-CM

## 2019-10-28 DIAGNOSIS — Z8659 Personal history of other mental and behavioral disorders: Secondary | ICD-10-CM

## 2019-10-28 DIAGNOSIS — Z8679 Personal history of other diseases of the circulatory system: Secondary | ICD-10-CM

## 2019-10-28 DIAGNOSIS — S62101D Fracture of unspecified carpal bone, right wrist, subsequent encounter for fracture with routine healing: Secondary | ICD-10-CM

## 2019-10-28 DIAGNOSIS — G35 Multiple sclerosis: Secondary | ICD-10-CM

## 2019-10-28 DIAGNOSIS — M4312 Spondylolisthesis, cervical region: Secondary | ICD-10-CM

## 2019-10-28 DIAGNOSIS — M5136 Other intervertebral disc degeneration, lumbar region: Secondary | ICD-10-CM

## 2019-10-28 DIAGNOSIS — Z96642 Presence of left artificial hip joint: Secondary | ICD-10-CM

## 2019-10-28 NOTE — Progress Notes (Addendum)
Office Visit Note  Patient: Sherri [name]             Date of Birth: [birth year]           MRN: [id number]             PCP: Binnie Rail, MD Referring: Binnie Rail, MD Visit Date: 10/28/2019 Occupation: @GUAROCC @  Subjective:  Rash on right arm.   History of Present Illness: Sherri [name] is a 79 y.o. female with history of seropositive rheumatoid arthritis.  She states she did some landscaping done to her house about few weeks ago.  2 weeks ago she was trying to water the plants and pulling of heavy hose.  She started having some redness on her right forearm.  She states the redness is spread and got worse.  She has been also having discomfort in her right elbow.  She was seen by Dr. Quay Burow 2 days ago and was placed on Keflex.  She has not noticed improvement in her right arm.  She states she feels tired and also has been running low-grade fever.  She denies any other history of injury.  She did not stop any of the medication she was taking.  None of the other joints are painful.  She also had an injury to her right eye from one of the thorn from her plant.  She was seen by an ophthalmologist and was told it was some bruising in her eye.  Activities of Daily Living:  Patient reports morning stiffness for   4-5  hours.   Patient Reports nocturnal pain.  Difficulty dressing/grooming: Reports Difficulty climbing stairs: Denies Difficulty getting out of chair: Denies Difficulty using hands for taps, buttons, cutlery, and/or writing: Reports  Review of Systems  Constitutional: Positive for fatigue.  HENT: Positive for mouth dryness. Negative for mouth sores and nose dryness.   Eyes: Positive for redness and dryness.  Respiratory: Negative for shortness of breath and difficulty breathing.   Cardiovascular: Negative for chest pain and palpitations.  Gastrointestinal: Positive for constipation. Negative for blood in stool and diarrhea.  Endocrine: Negative for increased  urination.  Genitourinary: Negative for difficulty urinating and painful urination.  Musculoskeletal: Positive for arthralgias, joint pain, joint swelling, myalgias, morning stiffness, muscle tenderness and myalgias.  Skin: Positive for rash and redness. Negative for hair loss.  Allergic/Immunologic: Negative for susceptible to infections.  Neurological: Positive for weakness. Negative for dizziness, numbness, headaches and memory loss.  Hematological: Positive for bruising/bleeding tendency.  Psychiatric/Behavioral: Negative for confusion.    PMFS History:  Patient Active Problem List   Diagnosis Date Noted  . Cellulitis of right upper extremity 10/26/2019  . Cervical radiculopathy 06/28/2019  . Carpal tunnel syndrome on both sides 06/28/2019  . Right sided sciatica 04/14/2018  . Trochanteric bursitis of right hip 04/14/2018  . S/P cervical spinal fusion 11/10/2017  . Swelling of left foot 10/26/2017  . Dysphagia 10/26/2017  . Left ankle swelling 10/26/2017  . Localized swelling, mass, and lump of head 08/18/2017  . Gastroesophageal reflux disease 08/06/2017  . Easy bruising 08/06/2017  . Anxiety 04/24/2017  . Mid back pain 03/26/2017  . Left sided chest pain 03/25/2017  . Dyspnea on exertion 03/14/2017  . Hyperkalemia 03/13/2017  . Submandibular gland swelling 01/08/2017  . High risk medication use 09/02/2016  . Urinary frequency 09/02/2016  . Piriformis muscle pain 09/02/2016  . Fatigue 07/22/2016  . Hair loss 07/22/2016  . Right hip pain 04/08/2016  . Right  knee pain 02/25/2016  . Dyspepsia 04/05/2015  . Numbness 11/14/2014  . Biceps tendon tear 10/10/2014  . Insomnia 10/10/2014  . Gait disturbance 06/13/2014  . 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 OP (osteoporosis) 01/18/2014  . Other long term (current) drug therapy 01/18/2014  . Other dysphagia 12/29/2013  . Hoarseness 12/29/2013  . 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 Personal history of  colonic polyps 07/03/2010  . Nocturia 05/28/2010  . THYROID NODULE 05/17/2008  . Multiple sclerosis ([place]) 05/17/2008  . Chronic fatigue 03/30/2007  . Hyperlipidemia 08/20/2006  . Essential hypertension 08/20/2006  . Rheumatoid arthritis ([place]) 08/20/2006  . CERVICAL CANCER, HX OF 08/20/2006  . GASTRIC ULCER, HX OF 08/20/2006    Past Medical History:  Diagnosis Date  . Anxiety    takes Valium daily as needed  . Basal cell carcinoma 01/21/1988   left nostril (MOHS), sup-right calf (CX35FU)  . Basal cell carcinoma 03/22/1996   right calf (CX35FU)  . Basal cell carcinoma 03/31/1995   left wing nose  . Bruises easily    d/t meds  . Cancer ([place])    basal cell ca, in situ- uterine   . Cataracts, bilateral   . Chronic back pain   . Dizziness    r/t to meds  . GERD (gastroesophageal reflux disease)    no meds on a regular basis but will take Tums if needed  . Headache(784.0)    r/t neck issues  . History of bronchitis 6-56yrs ago  . History of colon polyps    benign  . Hyperlipidemia    takes Atorvastatin on Mondays and Fridays  . Hypertension    takes Ramipril daily  . Joint pain   . Joint swelling   . Multiple sclerosis ([place])   . Neuromuscular disorder ([place])    Dr. Park Liter- [facility], follows [facility]  . Nocturia   . OA (osteoarthritis)    Dr Jani Gravel weekly, RA- hands- knees- feet   . PONV (postoperative nausea and vomiting)    trouble urinating after surgery in 2014  . Rheumatoid arthritis(714.0)    Dr Estanislado Pandy takes [name] daily  . Right wrist fracture   . Skin cancer   . Squamous cell carcinoma of skin 11/02/2001   in situ-right knee (cx32fu)  . Squamous cell carcinoma of skin 11/12/2011   in situ-left forearm (CX35FU), in situ-left foot (CX35FU)  . Squamous cell carcinoma of skin 01/22/2012   in situ-left lower forearm (txpbx)  . Squamous cell carcinoma of skin 11/17/2012   right shin (txpbx)  . Squamous cell carcinoma of skin 10/28/2013    in situ-right shoulder (CX35FU)  . Squamous cell carcinoma of skin 04/28/2014   well diff-right shin (txpbx)  . Squamous cell carcinoma of skin 11/02/2014   in situ-Left shin (txpbx)  . Squamous cell carcinoma of skin 12/15/2014   in situ-Left hand (txpbx), bowens-left side chest (txpbx)  . Squamous cell carcinoma of skin 05/09/2015   in situ-left hand (txpbx)   . Squamous cell carcinoma of skin 05/07/2016   in situ-left inner shin,ant, in situ-left inner shin, post, in situ-left outer forearm, in situ-right knuckle  . Squamous cell carcinoma of skin 03/1302018   in situ-left shoulder (txpbx), in situ-right inner shin (txpbx), in situ-top of left foot (txpbx), KA- left forearm (txpbx)  . Squamous cell carcinoma of skin 12/02/2016   in situ-left inner shin (txpbx)  . Squamous cell carcinoma of skin 03/04/2017   in situ-left outer sup, shin (txpbx),  in situ- right 2nd knuckle finger (txpbx), in situ- Left wrist (txpbx)  . Squamous cell carcinoma of skin 04/06/2018   in situ-above left knee inner (txpbx)  . Squamous cell carcinoma of skin 04/21/2018   in situ-right top hand (txpbx)  . Squamous cell carcinoma of skin 07/08/2018   in situ-right lower inner shin (txpbx)  . Squamous cell carcinoma of skin 04/27/2019   in situ- Left neck(CX35FU), In situ- right neck (CX35FU)  . Urinary retention    sees Dr.Wrenn about 2 times a yr    Family History  Problem Relation Age of Onset  . Cancer Mother        pancreatic  . Diabetes Mother   . Heart disease Father        Rheumatic  . Cancer Father        ? stomach  . Cancer Sister        stomach  . Cancer Maternal Aunt        X 4; ? primary  . Heart disease Paternal Aunt   . Cancer Maternal Grandmother        cervical  . Colon cancer Other        Aunts  . Multiple sclerosis Daughter   . Hypertension Neg Hx   . Stroke Neg Hx    Past Surgical History:  Procedure Laterality Date  . ABDOMINAL HYSTERECTOMY     1985  . APPENDECTOMY       with TAH  . BACK SURGERY     several  . BREAST BIOPSY Right    Results were negative  . BROW LIFT Bilateral 10/02/2016   Procedure: BILATERAL LOWER LID BLEPHAROPLASTY;  Surgeon: Clista Bernhardt, MD;  Location: [facility];  Service: Plastics;  Laterality: Bilateral;  . CARPAL TUNNEL RELEASE Right   . cataracts    . CERVICAL FUSION     Dr Joya Salm  . CERVICAL FUSION  12/31/2012   Dr Joya Salm  . CHEST TUBE INSERTION     for traumatic Pneumothorax  . COLONOSCOPY  07/16/2010   normal   . COLONOSCOPY W/ POLYPECTOMY  1997   negative since; Dr Deatra Ina  . ESOPHAGOGASTRODUODENOSCOPY  07/16/2010   normal  . eye lid raise    . EYE SURGERY Bilateral    cataracts removed - /w IOL  . JOINT REPLACEMENT    . LUMBAR FUSION     Dr Joya Salm  . NASAL SINUS SURGERY    . POSTERIOR CERVICAL FUSION/FORAMINOTOMY N/A 12/31/2012   Procedure: POSTERIOR LATERAL CERVICAL FUSION/FORAMINOTOMY LEVEL 1 CERVICAL THREE-FOUR WITH LATERAL MASS SCREWS;  Surgeon: Floyce Stakes, MD;  Location: [facility];  Service: Neurosurgery;  Laterality: N/A;  . PTOSIS REPAIR Bilateral 10/02/2016   Procedure: INTERNAL PTOSIS REPAIR;  Surgeon: Clista Bernhardt, MD;  Location: [facility];  Service: Plastics;  Laterality: Bilateral;  . SKIN BIOPSY    . THYROID SURGERY     R lobe removed- 1972, has grown back - CT last done- 2018  . TOTAL HIP ARTHROPLASTY  12/22/2011   Procedure: TOTAL HIP ARTHROPLASTY;  Surgeon: Kerin Salen, MD;  Location: [facility];  Service: Orthopedics;  Laterality: Left;  . TOTAL SHOULDER ARTHROPLASTY    . TUBAL LIGATION     Social History   Social History Narrative  . Not on file   Immunization History  Administered Date(s) Administered  . Influenza Whole 02/22/2007, 02/08/2013  . Influenza, High Dose Seasonal PF 02/04/2015, 02/18/2017, 02/21/2018, 01/16/2019  . Influenza-Unspecified 01/24/2014, 01/25/2016, 02/18/2017,  01/16/2019  . PFIZER SARS-COV-2 Vaccination 06/18/2019, 07/09/2019  . Pneumococcal Conjugate-13  11/23/2012, 09/08/2014  . Pneumococcal Polysaccharide-23 05/26/2006  . Pneumococcal-Unspecified 12/28/2012  . Td 05/28/2010  . Zoster 05/26/2009  . Zoster Recombinat (Shingrix) 02/10/2019, 04/14/2019     Objective: Vital Signs: BP 123/75 (BP Location: Left Arm, Patient Position: Sitting, Cuff Size: Normal)   Pulse 67   Resp 12   Ht 5\' 4"  (1.626 m)   Wt 119 lb 6.4 oz (54.2 kg)   BMI 20.49 kg/m    Physical Exam Vitals and nursing note reviewed.  Constitutional:      Appearance: She is well-developed.  HENT:     Head: Normocephalic and atraumatic.  Eyes:     Comments: Right conjunctival ecchymosis noted.  Cardiovascular:     Rate and Rhythm: Normal rate and regular rhythm.     Heart sounds: Normal heart sounds.  Pulmonary:     Effort: Pulmonary effort is normal.     Breath sounds: Normal breath sounds.  Abdominal:     General: Bowel sounds are normal.     Palpations: Abdomen is soft.  Musculoskeletal:     Cervical back: Normal range of motion.  Lymphadenopathy:     Cervical: No cervical adenopathy.  Skin:    General: Skin is warm and dry.     Capillary Refill: Capillary refill takes less than 2 seconds.     Comments: Erythema noted on her right forearm extending from mid forearm all the way to about the elbow.  Neurological:     Mental Status: She is alert and oriented to person, place, and time.  Psychiatric:        Behavior: Behavior normal.      Musculoskeletal Exam: C-spine was in range of motion.  Shoulder joints and elbow joints with good range of motion.  She has synovial thickening over MCP joints but no synovitis.  PIP and DIP thickening was noted.  Hip joints, knee joints, ankles, MTPs and PIPs with good range of motion with no synovitis.  CDAI Exam: CDAI Score: 0.8  Patient Global: 4 mm; Provider Global: 4 mm Swollen: 0 ; Tender: 0  Joint Exam 10/28/2019   No joint exam has been documented for this visit   There is currently no information documented  on the homunculus. Go to the Rheumatology activity and complete the homunculus joint exam.  Investigation: No additional findings.  Imaging: No results found.  Recent Labs: Lab Results  Component Value Date   WBC 4.5 10/04/2019   HGB 12.5 10/04/2019   PLT 330 10/04/2019   NA 138 10/04/2019   K 4.7 10/04/2019   CL 101 10/04/2019   CO2 26 10/04/2019   GLUCOSE 111 (H) 10/04/2019   BUN 14 10/04/2019   CREATININE 0.96 (H) 10/04/2019   BILITOT 0.5 10/04/2019   ALKPHOS 54 06/20/2019   AST 23 10/04/2019   ALT 18 10/04/2019   PROT 6.4 10/04/2019   ALBUMIN 4.2 06/20/2019   CALCIUM 9.3 10/04/2019   GFRAA 66 10/04/2019   QFTBGOLDPLUS NEGATIVE 10/04/2019    Speciality Comments: Prior therapy includes: Health and safety inspector (cost), [name] (cost)  Procedures:  No procedures performed Allergies: Darvon [propoxyphene hcl], Meperidine hcl, Penicillins, Azathioprine, Leflunomide, and Ezetimibe-simvastatin   Assessment / Plan:     Visit Diagnoses: Rheumatoid arthritis involving multiple sites with positive rheumatoid factor (HCC)-patient has some synovial thickening but no synovitis on examination.  She has been taking her medications on a regular basis without a flare.  High risk medication use -  Olumiant 2mg , MTX 6 tablets p.o. weekly, folic acid 2 mg p.o. daily, prednisone 5 mg p.o. daily.  Her labs from May were within normal limits.  Cellulitis of right upper extremity-patient developed possible injury a couple of weeks ago.  She has a cellulitis on her right forearm today.  She has not stopped any of her medications.  She has been on Keflex for 2 days without any improvement.  I detailed discussion with her regarding stopping all her medications except the prednisone.  I spoke with Dr. Alphonzo Severance at [facility].  He will evaluate patient at 1 PM today.  Patient was advised to go there at 1 PM.  Primary osteoarthritis of right hip-she has some limitation with range of motion.  History of total hip  replacement, left -by Dr. Mayer Camel.  She is doing well.  Closed fracture of right wrist with routine healing, subsequent encounter -she saw Dr. Rush Farmer in the past.  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-she has limited range of motion of her cervical spine.  DDD (degenerative disc disease), lumbar-she has chronic discomfort.  Other fatigue  Age-related osteoporosis without current pathological fracture-she is on Prolia injections.  History of multiple sclerosis ([place])  History of hyperlipidemia  Primary insomnia  History of depression  History of anxiety  History of hypertension  Orders: No orders of the defined types were placed in this encounter.  No orders of the defined types were placed in this encounter.   Face-to-face time spent with patient was 30 minutes. Greater than 50% of time was spent in counseling and coordination of care.  Follow-Up Instructions: Return in about 4 weeks (around 11/25/2019) for Rheumatoid arthritis.   Bo Merino, MD  Note - This record has been created using Editor, commissioning.  Chart creation errors have been sought, but may not always  have been located. Such creation errors do not reflect on  the standard of medical care.

## 2019-10-28 NOTE — Progress Notes (Signed)
Office Visit Note   Patient: Sherri [name]           Date of Birth: Nov 05, 1940           MRN: [id number] Visit Date: 10/28/2019 Requested by: Binnie Rail, MD [place],  [place] [id number] PCP: Binnie Rail, MD  Subjective: Chief Complaint  Patient presents with  . work in for possible cellulitis    HPI: Sherri [name] is a 79 y.o. female who presents to the office complaining of right arm pain.  Patient notes right arm redness with pain that began about 2 weeks ago while she was doing yard work.  She notes that it started as a small area of redness on the dorsal forearm before expanding to include almost the entirety of the dorsal forearm as well as over the olecranon and some of the medial aspect of the elbow.  She notes it feels swollen and hot compared with her contralateral arm.  She has had a temperature that she has measured around 99.7.  She does note over the last day that she has had some chills and some sweats at night with associated fatigue.  Her symptoms seem to be worsening.  Denies any nausea or vomiting.  She has a history of MS and rheumatoid arthritis for which she is on a lot of immunosuppressive medication.  She has been seen by Dr. Estanislado Pandy who has seen her today and counseled her to discontinue her RA medications.  She has been on 3 days of Keflex which have not improved her symptoms.  She has a history of left upper extremity cellulitis in the past..                ROS:  All systems reviewed are negative as they relate to the chief complaint within the history of present illness.  Patient denies fevers or chills.  Assessment & Plan: Visit Diagnoses: No diagnosis found.  Plan: Patient is a 79 year old female presents complaining of right arm redness and pain.  This began 2 weeks ago and has been worsening.  She does have significant redness with sharp demarcation compared with the surrounding skin.  No area of fluctuance is palpable but the  skin does feel indurated compared with the contralateral arm.  It is also warm to the touch compared with contralateral arm.  She is tender throughout this red area but most tender over the olecranon.  No significant palpable olecranon bursitis.  Ultrasound probe was placed on the arm and no large abscess was identified.  Regardless, with patient's worsening symptoms and no improvement on her oral antibiotic treatment, will send patient to emergency department for further evaluation.  I think it would be prudent to obtain an MRI of the right upper extremity to rule out an abscess.  I expressed this to the patient as well as the emergency department triage.  Patient agreed with this plan and states that she will call her husband and inform him and then drive to the Emergency Department.  Follow-Up Instructions: No follow-ups on file.   Orders:  No orders of the defined types were placed in this encounter.  No orders of the defined types were placed in this encounter.     Procedures: No procedures performed   Clinical Data: No additional findings.  Objective: Vital Signs: There were no vitals taken for this visit.  Physical Exam:  Constitutional: Patient appears well-developed HEENT:  Head: Normocephalic Eyes:EOM are normal Neck: Normal  range of motion Cardiovascular: Normal rate Pulmonary/chest: Effort normal Neurologic: Patient is alert Skin: Skin is warm Psychiatric: Patient has normal mood and affect  Ortho Exam:  Large area of erythema with sharp demarcation compared with the surrounding skin.  Erythematous area extends over the dorsal forearm around the olecranon and also to the medial elbow.  Warmth throughout this area of redness is warmer than the contralateral arm.  Tenderness throughout this area of redness, most tender over the olecranon.  No significant pain with elbow range of motion.  Specialty Comments:  No specialty comments available.  Imaging: No results  found.   PMFS History: Patient Active Problem List   Diagnosis Date Noted  . Cellulitis of right upper extremity 10/26/2019  . Cervical radiculopathy 06/28/2019  . Carpal tunnel syndrome on both sides 06/28/2019  . Right sided sciatica 04/14/2018  . Trochanteric bursitis of right hip 04/14/2018  . S/P cervical spinal fusion 11/10/2017  . Swelling of left foot 10/26/2017  . Dysphagia 10/26/2017  . Left ankle swelling 10/26/2017  . Localized swelling, mass, and lump of head 08/18/2017  . Gastroesophageal reflux disease 08/06/2017  . Easy bruising 08/06/2017  . Anxiety 04/24/2017  . Mid back pain 03/26/2017  . Left sided chest pain 03/25/2017  . Dyspnea on exertion 03/14/2017  . Hyperkalemia 03/13/2017  . Submandibular gland swelling 01/08/2017  . High risk medication use 09/02/2016  . Urinary frequency 09/02/2016  . Piriformis muscle pain 09/02/2016  . Fatigue 07/22/2016  . Hair loss 07/22/2016  . Right hip pain 04/08/2016  . Right knee pain 02/25/2016  . Dyspepsia 04/05/2015  . Numbness 11/14/2014  . Biceps tendon tear 10/10/2014  . Insomnia 10/10/2014  . Gait disturbance 06/13/2014  . 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 OP (osteoporosis) 01/18/2014  . Other long term (current) drug therapy 01/18/2014  . Other dysphagia 12/29/2013  . Hoarseness 12/29/2013  . 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 Personal history of colonic polyps 07/03/2010  . Nocturia 05/28/2010  . THYROID NODULE 05/17/2008  . Multiple sclerosis ([place]) 05/17/2008  . Chronic fatigue 03/30/2007  . Hyperlipidemia 08/20/2006  . Essential hypertension 08/20/2006  . Rheumatoid arthritis ([place]) 08/20/2006  . CERVICAL CANCER, HX OF 08/20/2006  . GASTRIC ULCER, HX OF 08/20/2006   Past Medical History:  Diagnosis Date  . Anxiety    takes Valium daily as needed  . Basal cell carcinoma 01/21/1988   left nostril (MOHS), sup-right calf (CX35FU)  . Basal cell carcinoma 03/22/1996   right  calf (CX35FU)  . Basal cell carcinoma 03/31/1995   left wing nose  . Bruises easily    d/t meds  . Cancer ([place])    basal cell ca, in situ- uterine   . Cataracts, bilateral   . Chronic back pain   . Dizziness    r/t to meds  . GERD (gastroesophageal reflux disease)    no meds on a regular basis but will take Tums if needed  . Headache(784.0)    r/t neck issues  . History of bronchitis 6-48yrs ago  . History of colon polyps    benign  . Hyperlipidemia    takes Atorvastatin on Mondays and Fridays  . Hypertension    takes Ramipril daily  . Joint pain   . Joint swelling   . Multiple sclerosis ([place])   . Neuromuscular disorder ([place])    Dr. Park Liter- [facility], follows M.S.  . Nocturia   . OA (osteoarthritis)    Dr Jani Gravel  weekly, RA- hands- knees- feet   . PONV (postoperative nausea and vomiting)    trouble urinating after surgery in 2014  . Rheumatoid arthritis(714.0)    Dr Estanislado Pandy takes [name] daily  . Right wrist fracture   . Skin cancer   . Squamous cell carcinoma of skin 11/02/2001   in situ-right knee (cx52fu)  . Squamous cell carcinoma of skin 11/12/2011   in situ-left forearm (CX35FU), in situ-left foot (CX35FU)  . Squamous cell carcinoma of skin 01/22/2012   in situ-left lower forearm (txpbx)  . Squamous cell carcinoma of skin 11/17/2012   right shin (txpbx)  . Squamous cell carcinoma of skin 10/28/2013   in situ-right shoulder (CX35FU)  . Squamous cell carcinoma of skin 04/28/2014   well diff-right shin (txpbx)  . Squamous cell carcinoma of skin 11/02/2014   in situ-Left shin (txpbx)  . Squamous cell carcinoma of skin 12/15/2014   in situ-Left hand (txpbx), bowens-left side chest (txpbx)  . Squamous cell carcinoma of skin 05/09/2015   in situ-left hand (txpbx)   . Squamous cell carcinoma of skin 05/07/2016   in situ-left inner shin,ant, in situ-left inner shin, post, in situ-left outer forearm, in situ-right knuckle  . Squamous cell  carcinoma of skin 03/1302018   in situ-left shoulder (txpbx), in situ-right inner shin (txpbx), in situ-top of left foot (txpbx), KA- left forearm (txpbx)  . Squamous cell carcinoma of skin 12/02/2016   in situ-left inner shin (txpbx)  . Squamous cell carcinoma of skin 03/04/2017   in situ-left outer sup, shin (txpbx), in situ- right 2nd knuckle finger (txpbx), in situ- Left wrist (txpbx)  . Squamous cell carcinoma of skin 04/06/2018   in situ-above left knee inner (txpbx)  . Squamous cell carcinoma of skin 04/21/2018   in situ-right top hand (txpbx)  . Squamous cell carcinoma of skin 07/08/2018   in situ-right lower inner shin (txpbx)  . Squamous cell carcinoma of skin 04/27/2019   in situ- Left neck(CX35FU), In situ- right neck (CX35FU)  . Urinary retention    sees Dr.Wrenn about 2 times a yr    Family History  Problem Relation Age of Onset  . Cancer Mother        pancreatic  . Diabetes Mother   . Heart disease Father        Rheumatic  . Cancer Father        ? stomach  . Cancer Sister        stomach  . Cancer Maternal Aunt        X 4; ? primary  . Heart disease Paternal Aunt   . Cancer Maternal Grandmother        cervical  . Colon cancer Other        Aunts  . Multiple sclerosis Daughter   . Hypertension Neg Hx   . Stroke Neg Hx     Past Surgical History:  Procedure Laterality Date  . ABDOMINAL HYSTERECTOMY     1985  . APPENDECTOMY     with TAH  . BACK SURGERY     several  . BREAST BIOPSY Right    Results were negative  . BROW LIFT Bilateral 10/02/2016   Procedure: BILATERAL LOWER LID BLEPHAROPLASTY;  Surgeon: Clista Bernhardt, MD;  Location: [facility];  Service: Plastics;  Laterality: Bilateral;  . CARPAL TUNNEL RELEASE Right   . cataracts    . CERVICAL FUSION     Dr Joya Salm  . CERVICAL FUSION  12/31/2012   Dr  Botero  . CHEST TUBE INSERTION     for traumatic Pneumothorax  . COLONOSCOPY  07/16/2010   normal   . COLONOSCOPY W/ POLYPECTOMY  1997   negative  since; Dr Deatra Ina  . ESOPHAGOGASTRODUODENOSCOPY  07/16/2010   normal  . eye lid raise    . EYE SURGERY Bilateral    cataracts removed - /w IOL  . JOINT REPLACEMENT    . LUMBAR FUSION     Dr Joya Salm  . NASAL SINUS SURGERY    . POSTERIOR CERVICAL FUSION/FORAMINOTOMY N/A 12/31/2012   Procedure: POSTERIOR LATERAL CERVICAL FUSION/FORAMINOTOMY LEVEL 1 CERVICAL THREE-FOUR WITH LATERAL MASS SCREWS;  Surgeon: Floyce Stakes, MD;  Location: [facility];  Service: Neurosurgery;  Laterality: N/A;  . PTOSIS REPAIR Bilateral 10/02/2016   Procedure: INTERNAL PTOSIS REPAIR;  Surgeon: Clista Bernhardt, MD;  Location: [facility];  Service: Plastics;  Laterality: Bilateral;  . SKIN BIOPSY    . THYROID SURGERY     R lobe removed- 1972, has grown back - CT last done- 2018  . TOTAL HIP ARTHROPLASTY  12/22/2011   Procedure: TOTAL HIP ARTHROPLASTY;  Surgeon: Kerin Salen, MD;  Location: [facility];  Service: Orthopedics;  Laterality: Left;  . TOTAL SHOULDER ARTHROPLASTY    . TUBAL LIGATION     Social History   Occupational History  . Occupation: retired  Tobacco Use  . Smoking status: Former Smoker    Packs/day: 2.50    Years: 20.00    Pack years: 50.00    Types: Cigarettes    Quit date: 05/26/1981    Years since quitting: 38.4  . Smokeless tobacco: Never Used  . Tobacco comment: Smoked [phone], up to 2.5 ppd  Substance and Sexual Activity  . Alcohol use: Yes    Alcohol/week: 14.0 standard drinks    Types: 14 Cans of beer per week    Comment: 2% beer  . Drug use: Never  . Sexual activity: Yes

## 2019-10-28 NOTE — Patient Instructions (Signed)
Standing Labs We placed an order today for your standing lab work.    Please come back and get your standing labs in August and every 3 months   We have open lab daily Monday through Thursday from 8:30-12:30 PM and 1:30-4:30 PM and Friday from 8:30-12:30 PM and 1:30-4:00 PM at the office of Dr. Awanda Wilcock.   You may experience shorter wait times on Monday and Friday afternoons. The office is located at [address], [address], [place], [place] [address] No appointment is necessary.   Labs are drawn by [facility].  You may receive a bill from [facility] for your lab work.  If you wish to have your labs drawn at another location, please call the office 24 hours in advance to send orders.  If you have any questions regarding directions or hours of operation,  please call [phone].   Just as a reminder please drink plenty of water prior to coming for your lab work. Thanks!   

## 2019-10-28 NOTE — Telephone Encounter (Signed)
Patient states she started having elbow pain about 1 1/2 weeks ago. Patient states she is having redness and swelling and pain from her right elbow to her wrist. Patient states the area on her arm itches. Patient staets if she reaches for something it pulls under her arm and it is uncomfortable.  Patient states she went to see Dr. Quay Burow who gave her Keflex. Patient states it has not been improving. Patient states she has also been experiencing extreme fatigue. Patient states she has been running a low grade fever 99.7. Patient states she has a sore throat. Patient states she has had her Covid vaccines. Patient would like to know if she should be seen here or by orthopedics. Please advise.

## 2019-10-28 NOTE — ED Notes (Signed)
Pt gave registration labels and notified staff they were leaving. Pt seen leaving by staff

## 2019-10-28 NOTE — Telephone Encounter (Signed)
Patient scheduled to be seen 10/28/2019.

## 2019-10-28 NOTE — Telephone Encounter (Signed)
Please schedule an appointment with [place] for further evaluation.

## 2019-10-28 NOTE — Telephone Encounter (Signed)
Patient calling in reference to a right swollen, red elbow down to wrist. Patient went to GP whom gave her an  Antibotic. Patient has been taking Keflex since Wednesday with not relief. GP recommended Derm or Ortho. Patient calling to see if she needs to be seen here? Please call to discuss.

## 2019-10-30 ENCOUNTER — Emergency Department (HOSPITAL_COMMUNITY)
Admission: EM | Admit: 2019-10-30 | Discharge: 2019-10-30 | Disposition: A | Discharge status: Home / Self Care | Payer: Medicare Other | Attending: Emergency Medicine | Admitting: Emergency Medicine

## 2019-10-30 ENCOUNTER — Encounter (HOSPITAL_COMMUNITY): Payer: Self-pay | Admitting: Emergency Medicine

## 2019-10-30 DIAGNOSIS — L03113 Cellulitis of right upper limb: Secondary | ICD-10-CM | POA: Diagnosis not present

## 2019-10-30 DIAGNOSIS — L539 Erythematous condition, unspecified: Secondary | ICD-10-CM | POA: Diagnosis present

## 2019-10-30 LAB — CBC WITH DIFFERENTIAL/PLATELET
Abs Immature Granulocytes: 0.09 10*3/uL — ABNORMAL HIGH (ref 0.00–0.07)
Basophils Absolute: 0 10*3/uL (ref 0.0–0.1)
Basophils Relative: 0 %
Eosinophils Absolute: 0 10*3/uL (ref 0.0–0.5)
Eosinophils Relative: 0 %
HCT: 36.4 % (ref 36.0–46.0)
Hemoglobin: 12 g/dL (ref 12.0–15.0)
Immature Granulocytes: 2 %
Lymphocytes Relative: 5 %
Lymphs Abs: 0.3 10*3/uL — ABNORMAL LOW (ref 0.7–4.0)
MCH: 35.9 pg — ABNORMAL HIGH (ref 26.0–34.0)
MCHC: 33 g/dL (ref 30.0–36.0)
MCV: 109 fL — ABNORMAL HIGH (ref 80.0–100.0)
Monocytes Absolute: 0.4 10*3/uL (ref 0.1–1.0)
Monocytes Relative: 8 %
Neutro Abs: 4.5 10*3/uL (ref 1.7–7.7)
Neutrophils Relative %: 85 %
Platelets: 284 10*3/uL (ref 150–400)
RBC: 3.34 MIL/uL — ABNORMAL LOW (ref 3.87–5.11)
RDW: 15.2 % (ref 11.5–15.5)
WBC: 5.2 10*3/uL (ref 4.0–10.5)
nRBC: 0 % (ref 0.0–0.2)

## 2019-10-30 LAB — COMPREHENSIVE METABOLIC PANEL
ALT: 21 U/L (ref 0–44)
AST: 27 U/L (ref 15–41)
Albumin: 3.5 g/dL (ref 3.5–5.0)
Alkaline Phosphatase: 40 U/L (ref 38–126)
Anion gap: 8 (ref 5–15)
BUN: 9 mg/dL (ref 8–23)
CO2: 25 mmol/L (ref 22–32)
Calcium: 9 mg/dL (ref 8.9–10.3)
Chloride: 104 mmol/L (ref 98–111)
Creatinine, Ser: 0.93 mg/dL (ref 0.44–1.00)
GFR calc Af Amer: >60 mL/min (ref >60)
GFR calc non Af Amer: 59 mL/min — ABNORMAL LOW (ref >60)
Glucose, Bld: 100 mg/dL — ABNORMAL HIGH (ref 70–99)
Potassium: 3.8 mmol/L (ref 3.5–5.1)
Sodium: 137 mmol/L (ref 135–145)
Total Bilirubin: 0.4 mg/dL (ref 0.3–1.2)
Total Protein: 6.5 g/dL (ref 6.5–8.1)

## 2019-10-30 MED ORDER — CEPHALEXIN 500 MG PO CAPS
500.0000 mg | ORAL_CAPSULE | Freq: Three times a day (TID) | ORAL | 0 refills | Status: AC
Start: 2019-11-03 — End: 2019-11-06

## 2019-10-30 MED ORDER — HYDROXYZINE HCL 10 MG PO TABS
10.0000 mg | ORAL_TABLET | Freq: Three times a day (TID) | ORAL | 0 refills | Status: DC | PRN
Start: 2019-10-30 — End: 2020-01-11

## 2019-10-30 NOTE — Discharge Instructions (Signed)
As discussed, it is important that you continue taking your antibiotics for your cellulitis.  In addition, you have 3 additional days of medication waiting for you at your pharmacy. 4 additional control of your itching, Atarax has been provided.  When he takes medication, please do not drive, and monitor your condition carefully.  Please be sure to schedule follow-up with your primary care physician or return here for concerning changes.

## 2019-10-30 NOTE — ED Notes (Signed)
Patient verbalizes understanding of discharge instructions. Opportunity for questioning and answers were provided. Armband removed by staff, pt discharged from ED.  

## 2019-10-30 NOTE — ED Triage Notes (Signed)
My elbow will swell as big as a soft ball.

## 2019-10-30 NOTE — ED Provider Notes (Signed)
[facility] EMERGENCY DEPARTMENT Provider Note   CSN: [id number] Arrival date & time: 10/30/19  1027     History Chief Complaint  Patient presents with  . Arm Pain    redness    Sherri [name] is a 79 y.o. female.  HPI    Patient presents with concern of ongoing pain in her right arm. Patient has MS, rheumatoid disease, takes her immunologic therapy regularly.  Her regimen seemingly includes Donusumab and prednisone, the latter daily.  1 week ago the patient was working in her yard, moving objects, plants, and soon thereafter noticed redness, swelling in her right proximal forearm and distal upper arm.  Subsequently the redness spread, discomfort, described as soreness, severe stretching sensation and itching worsened.  She was able to see her physician 4 days ago started on Keflex.  2 days ago the patient went to walk-in orthopedic clinic due to persistent redness.  She saw orthopedic PA, was encouraged to follow-up with a surgeon, came to the emergency department, but left without being seen.  She notes that since that time swelling has diminished, redness has not spread past the line drawn on her left arm, and she has no new fever, no vomiting or other complaints. She continues to take her Keflex, but is unclear if she is taking other medication for additional therapy. With concern for her MS, rheumatoid disease, after speaking with family member she presents for evaluation.   Past Medical History:  Diagnosis Date  . Anxiety    takes Valium daily as needed  . Basal cell carcinoma 01/21/1988   left nostril (MOHS), sup-right calf (CX35FU)  . Basal cell carcinoma 03/22/1996   right calf (CX35FU)  . Basal cell carcinoma 03/31/1995   left wing nose  . Bruises easily    d/t meds  . Cancer ([place])    basal cell ca, in situ- uterine   . Cataracts, bilateral   . Chronic back pain   . Dizziness    r/t to meds  . GERD (gastroesophageal reflux disease)    no  meds on a regular basis but will take Tums if needed  . Headache(784.0)    r/t neck issues  . History of bronchitis 6-17yrs ago  . History of colon polyps    benign  . Hyperlipidemia    takes Atorvastatin on Mondays and Fridays  . Hypertension    takes Ramipril daily  . Joint pain   . Joint swelling   . Multiple sclerosis ([place])   . Neuromuscular disorder ([place])    Dr. Park Liter- [facility], follows M.S.  . Nocturia   . OA (osteoarthritis)    Dr Jani Gravel weekly, RA- hands- knees- feet   . PONV (postoperative nausea and vomiting)    trouble urinating after surgery in 2014  . Rheumatoid arthritis(714.0)    Dr Estanislado Pandy takes [name] daily  . Right wrist fracture   . Skin cancer   . Squamous cell carcinoma of skin 11/02/2001   in situ-right knee (cx19fu)  . Squamous cell carcinoma of skin 11/12/2011   in situ-left forearm (CX35FU), in situ-left foot (CX35FU)  . Squamous cell carcinoma of skin 01/22/2012   in situ-left lower forearm (txpbx)  . Squamous cell carcinoma of skin 11/17/2012   right shin (txpbx)  . Squamous cell carcinoma of skin 10/28/2013   in situ-right shoulder (CX35FU)  . Squamous cell carcinoma of skin 04/28/2014   well diff-right shin (txpbx)  . Squamous cell carcinoma of skin 11/02/2014  in situ-Left shin (txpbx)  . Squamous cell carcinoma of skin 12/15/2014   in situ-Left hand (txpbx), bowens-left side chest (txpbx)  . Squamous cell carcinoma of skin 05/09/2015   in situ-left hand (txpbx)   . Squamous cell carcinoma of skin 05/07/2016   in situ-left inner shin,ant, in situ-left inner shin, post, in situ-left outer forearm, in situ-right knuckle  . Squamous cell carcinoma of skin 03/1302018   in situ-left shoulder (txpbx), in situ-right inner shin (txpbx), in situ-top of left foot (txpbx), KA- left forearm (txpbx)  . Squamous cell carcinoma of skin 12/02/2016   in situ-left inner shin (txpbx)  . Squamous cell carcinoma of skin 03/04/2017     in situ-left outer sup, shin (txpbx), in situ- right 2nd knuckle finger (txpbx), in situ- Left wrist (txpbx)  . Squamous cell carcinoma of skin 04/06/2018   in situ-above left knee inner (txpbx)  . Squamous cell carcinoma of skin 04/21/2018   in situ-right top hand (txpbx)  . Squamous cell carcinoma of skin 07/08/2018   in situ-right lower inner shin (txpbx)  . Squamous cell carcinoma of skin 04/27/2019   in situ- Left neck(CX35FU), In situ- right neck (CX35FU)  . Urinary retention    sees Dr.Wrenn about 2 times a yr    Patient Active Problem List   Diagnosis Date Noted  . Cellulitis of right upper extremity 10/26/2019  . Cervical radiculopathy 06/28/2019  . Carpal tunnel syndrome on both sides 06/28/2019  . Right sided sciatica 04/14/2018  . Trochanteric bursitis of right hip 04/14/2018  . S/P cervical spinal fusion 11/10/2017  . Swelling of left foot 10/26/2017  . Dysphagia 10/26/2017  . Left ankle swelling 10/26/2017  . Localized swelling, mass, and lump of head 08/18/2017  . Gastroesophageal reflux disease 08/06/2017  . Easy bruising 08/06/2017  . Anxiety 04/24/2017  . Mid back pain 03/26/2017  . Left sided chest pain 03/25/2017  . Dyspnea on exertion 03/14/2017  . Hyperkalemia 03/13/2017  . Submandibular gland swelling 01/08/2017  . High risk medication use 09/02/2016  . Urinary frequency 09/02/2016  . Piriformis muscle pain 09/02/2016  . Fatigue 07/22/2016  . Hair loss 07/22/2016  . Right hip pain 04/08/2016  . Right knee pain 02/25/2016  . Dyspepsia 04/05/2015  . Numbness 11/14/2014  . Biceps tendon tear 10/10/2014  . Insomnia 10/10/2014  . Gait disturbance 06/13/2014  . 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 OP (osteoporosis) 01/18/2014  . Other long term (current) drug therapy 01/18/2014  . Other dysphagia 12/29/2013  . Hoarseness 12/29/2013  . 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 Personal history of colonic polyps 07/03/2010  .  Nocturia 05/28/2010  . THYROID NODULE 05/17/2008  . Multiple sclerosis ([place]) 05/17/2008  . Chronic fatigue 03/30/2007  . Hyperlipidemia 08/20/2006  . Essential hypertension 08/20/2006  . Rheumatoid arthritis ([place]) 08/20/2006  . CERVICAL CANCER, HX OF 08/20/2006  . GASTRIC ULCER, HX OF 08/20/2006    Past Surgical History:  Procedure Laterality Date  . ABDOMINAL HYSTERECTOMY     1985  . APPENDECTOMY     with TAH  . BACK SURGERY     several  . BREAST BIOPSY Right    Results were negative  . BROW LIFT Bilateral 10/02/2016   Procedure: BILATERAL LOWER LID BLEPHAROPLASTY;  Surgeon: Clista Bernhardt, MD;  Location: [facility];  Service: Plastics;  Laterality: Bilateral;  . CARPAL TUNNEL RELEASE Right   . cataracts    . CERVICAL FUSION     Dr Joya Salm  .  CERVICAL FUSION  12/31/2012   Dr Joya Salm  . CHEST TUBE INSERTION     for traumatic Pneumothorax  . COLONOSCOPY  07/16/2010   normal   . COLONOSCOPY W/ POLYPECTOMY  1997   negative since; Dr Deatra Ina  . ESOPHAGOGASTRODUODENOSCOPY  07/16/2010   normal  . eye lid raise    . EYE SURGERY Bilateral    cataracts removed - /w IOL  . JOINT REPLACEMENT    . LUMBAR FUSION     Dr Joya Salm  . NASAL SINUS SURGERY    . POSTERIOR CERVICAL FUSION/FORAMINOTOMY N/A 12/31/2012   Procedure: POSTERIOR LATERAL CERVICAL FUSION/FORAMINOTOMY LEVEL 1 CERVICAL THREE-FOUR WITH LATERAL MASS SCREWS;  Surgeon: Floyce Stakes, MD;  Location: [facility];  Service: Neurosurgery;  Laterality: N/A;  . PTOSIS REPAIR Bilateral 10/02/2016   Procedure: INTERNAL PTOSIS REPAIR;  Surgeon: Clista Bernhardt, MD;  Location: [facility];  Service: Plastics;  Laterality: Bilateral;  . SKIN BIOPSY    . THYROID SURGERY     R lobe removed- 1972, has grown back - CT last done- 2018  . TOTAL HIP ARTHROPLASTY  12/22/2011   Procedure: TOTAL HIP ARTHROPLASTY;  Surgeon: Kerin Salen, MD;  Location: [facility];  Service: Orthopedics;  Laterality: Left;  . TOTAL SHOULDER ARTHROPLASTY    . TUBAL  LIGATION       OB History   No obstetric history on file.     Family History  Problem Relation Age of Onset  . Cancer Mother        pancreatic  . Diabetes Mother   . Heart disease Father        Rheumatic  . Cancer Father        ? stomach  . Cancer Sister        stomach  . Cancer Maternal Aunt        X 4; ? primary  . Heart disease Paternal Aunt   . Cancer Maternal Grandmother        cervical  . Colon cancer Other        Aunts  . Multiple sclerosis Daughter   . Hypertension Neg Hx   . Stroke Neg Hx     Social History   Tobacco Use  . Smoking status: Former Smoker    Packs/day: 2.50    Years: 20.00    Pack years: 50.00    Types: Cigarettes    Quit date: 05/26/1981    Years since quitting: 38.4  . Smokeless tobacco: Never Used  . Tobacco comment: Smoked [phone], up to 2.5 ppd  Substance Use Topics  . Alcohol use: Yes    Alcohol/week: 14.0 standard drinks    Types: 14 Cans of beer per week    Comment: 2% beer  . Drug use: Never    Home Medications Prior to Admission medications   Medication Sig Start Date End Date Taking? Authorizing Provider  atorvastatin (LIPITOR) 10 MG tablet Take 1 tablet by mouth on Mondays and Friday 05/16/19   Binnie Rail, MD  Biotin 5000 MCG TABS Take 5,000 mcg by mouth daily.     [provider]  Calcium Carbonate-Vitamin D (CALCIUM + D PO) Take 1 tablet by mouth daily.     [provider]  Carboxymethylcellul-Glycerin (LUBRICATING EYE DROPS OP) Apply 1 drop to eye daily as needed (dry eyes).    [provider]  carvedilol (COREG) 6.25 MG tablet TAKE 1 TABLET (6.25 MG TOTAL) BY MOUTH 2 (TWO) TIMES DAILY WITH  A MEAL. 04/15/19   Binnie Rail, MD  cephALEXin (KEFLEX) 500 MG capsule Take 1 capsule (500 mg total) by mouth 3 (three) times daily. 10/26/19   Binnie Rail, MD  Cholecalciferol (VITAMIN D3) 2000 units capsule Take 2,000 Units by mouth daily.    [provider]  Cyanocobalamin (VITAMIN  B-12 PO) Take 1 tablet by mouth daily.    [provider]  denosumab (PROLIA) 60 MG/ML SOSY injection INJECT 60 MG INTO THE SKIN EVERY 6 (SIX) MONTHS. 06/06/19   Bo Merino, MD  diclofenac sodium (VOLTAREN) 1 % GEL APPLY 3 GRAMS TO 3 LARGE JOINTS 3 TIMES A DAY AS NEEDED 01/27/18   Bo Merino, MD  famotidine (PEPCID) 40 MG tablet Take 1 tablet (40 mg total) by mouth daily. 06/12/19   Binnie Rail, MD  folic acid (FOLVITE) 1 MG tablet TAKE 2 TABLETS BY MOUTH EVERY DAY 07/25/19   Bo Merino, MD  methotrexate (RHEUMATREX) 2.5 MG tablet TAKE 6 TABLETS BY MOUTH ONCE WEEKLY. CAUTION:CHEMOTHERAPY. PROTECT FROM LIGHT. 09/08/19   Bo Merino, MD  OLUMIANT 2 MG TABS tablet TAKE ONE TABLET (2MG ) BY MOUTH DAILY 10/04/19   Bo Merino, MD  Oxycodone HCl 10 MG TABS Take 1 tablet by mouth at bedtime as needed 10/25/19   Sater, Nanine Means, MD  predniSONE (DELTASONE) 5 MG tablet TAKE 1 TABLET BY MOUTH EVERY DAY WITH BREAKFAST 10/06/19   Bo Merino, MD  Pyridoxine HCl (VITAMIN B-6 PO) Take 1 tablet by mouth daily.    [provider]  traMADol (ULTRAM) 50 MG tablet TAKE 1 TABLET BY MOUTH THREE TIMES A DAY 06/28/19   Sater, Nanine Means, MD    Allergies    Darvon [propoxyphene hcl], Meperidine hcl, Penicillins, Azathioprine, Leflunomide, and Ezetimibe-simvastatin  Review of Systems   Review of Systems  Constitutional:       Per HPI, otherwise negative  HENT:       Per HPI, otherwise negative  Respiratory:       Per HPI, otherwise negative  Cardiovascular:       Per HPI, otherwise negative  Gastrointestinal: Negative for vomiting.  Endocrine:       Negative aside from HPI  Genitourinary:       Neg aside from HPI   Musculoskeletal:       Per HPI, otherwise negative  Skin: Positive for color change and wound.  Allergic/Immunologic: Positive for immunocompromised state.  Neurological: Negative for syncope.    Physical Exam Updated Vital Signs BP 131/66 (BP  Location: Left Arm)   Pulse 95   Temp 98.8 F (37.1 C) (Oral)   Resp (!) 7   Ht 5\' 3"  (1.6 m)   Wt 50.8 kg   SpO2 100%   BMI 19.84 kg/m   Physical Exam Vitals and nursing note reviewed.  Constitutional:      General: She is not in acute distress.    Appearance: She is well-developed.  HENT:     Head: Normocephalic and atraumatic.  Eyes:     Conjunctiva/sclera: Conjunctivae normal.  Cardiovascular:     Rate and Rhythm: Normal rate and regular rhythm.     Pulses: Normal pulses.  Pulmonary:     Effort: Pulmonary effort is normal. No respiratory distress.     Breath sounds: No stridor.  Abdominal:     General: There is no distension.  Musculoskeletal:       Arms:  Skin:    General: Skin is warm and dry.  Neurological:     Mental Status: She is alert and oriented to person, place, and time.     Cranial Nerves: No cranial nerve deficit.  Psychiatric:        Mood and Affect: Mood normal.        Behavior: Behavior normal.       ED Results / Procedures / Treatments   Labs (all labs ordered are listed, but only abnormal results are displayed) Labs Reviewed  COMPREHENSIVE METABOLIC PANEL - Abnormal; Notable for the following components:      Result Value   Glucose, Bld 100 (*)    GFR calc non Af Amer 59 (*)    All other components within normal limits  CBC WITH DIFFERENTIAL/PLATELET - Abnormal; Notable for the following components:   RBC 3.34 (*)    MCV 109.0 (*)    MCH 35.9 (*)    Lymphs Abs 0.3 (*)    Abs Immature Granulocytes 0.09 (*)    All other components within normal limits    Procedures Procedures (including critical care time)  Medications Ordered in ED Medications - No data to display  ED Course  I have reviewed the triage vital signs and the nursing notes.  Pertinent labs & imaging results that were available during my care of the patient were reviewed by me and considered in my medical decision making (see chart for details).   Elderly  female presents with ongoing erythema, pain in her right mid arm.  Patient is awake alert, afebrile.  Initial differential including cellulitis, bacteremia, sepsis, particularly with the patient's immunocompromise status considered, labs sent.  MDM Rules/Calculators/A&P                      1:54 PM Labs reviewed, discussed.  No leukocytosis, absent fever, distal loss of function, no evidence for bacteremia, sepsis, deep space infection.  We lengthy conversation about all findings.  Given these reassuring findings, the patient's denial of progression of erythema past the previously demarcated area, patient is appropriate for ongoing outpatient therapy with antibiotics, and follow-up. Patient is amenable to the plan, and is discharged in stable condition. Final Clinical Impression(s) / ED Diagnoses Final diagnoses:  Cellulitis of right upper extremity    Rx / DC Orders ED Discharge Orders         Ordered    cephALEXin (KEFLEX) 500 MG capsule  3 times daily     10/30/19 1358    hydrOXYzine (ATARAX/VISTARIL) 10 MG tablet  Every 8 hours PRN     10/30/19 1358           Carmin Muskrat, MD 10/30/19 1358

## 2019-10-30 NOTE — ED Triage Notes (Signed)
Pt. Stated, I have a red rt. Forearm red, and very itchy. It started last Monday, and Ive worked in the yard the weekend.  Ive gone to several Doctors.

## 2019-11-01 ENCOUNTER — Other Ambulatory Visit: Payer: Self-pay

## 2019-11-01 ENCOUNTER — Other Ambulatory Visit: Payer: Self-pay | Admitting: Rheumatology

## 2019-11-01 ENCOUNTER — Encounter: Payer: Self-pay | Admitting: Family Medicine

## 2019-11-01 ENCOUNTER — Ambulatory Visit (INDEPENDENT_AMBULATORY_CARE_PROVIDER_SITE_OTHER): Payer: Medicare Other | Admitting: Family Medicine

## 2019-11-01 DIAGNOSIS — L03113 Cellulitis of right upper limb: Secondary | ICD-10-CM

## 2019-11-01 MED ORDER — CLINDAMYCIN HCL 300 MG PO CAPS
300.0000 mg | ORAL_CAPSULE | Freq: Three times a day (TID) | ORAL | 0 refills | Status: DC
Start: 2019-11-01 — End: 2019-12-09

## 2019-11-01 NOTE — Progress Notes (Signed)
I saw and examined the patient with Dr. Mayer Masker and agree with assessment and plan as outlined.    Possible erysipelas right arm, not improving with keflex.  Pain and itching.  Will switch to clindamycin.  Will ask Dr. Kathee Delton whether we can treat with a steroid burst.  MRI ordered.

## 2019-11-01 NOTE — Progress Notes (Signed)
Sherri [name] - 79 y.o. female MRN [id number]  Date of birth: [birth year]  Office Visit Note: Visit Date: 11/01/2019 PCP: Binnie Rail, MD Referred by: Binnie Rail, MD  Subjective: No chief complaint on file.  HPI: Sherri [name] is a 79 y.o. female with a hx of RA who comes in today with worsening right arm swelling x1 week after doing yard work the weekend of 5/29. The following Monday she noticed redness and pain of her R arm which she assumed was related to her RA. She became concerned when these symptoms persisted and became itchy as well, so she saw her PCP who started her on keflex. She then saw her rheumatologist who referred her to see ortho, so she saw the ortho PA on 6/4 who sent her to the ED for an MRI and fluids, however, when she got to the ED she found the wait to be too long, so she went home without being seen. On 6/6 she returned to the ED where she was given a longer dose of keflex and hydroxyzine for the itching. She says these have not helped her symptoms and the rash seems to have spread further despite taking the keflex. She describes the pain as a burning sensation.   ROS Otherwise per HPI.  Assessment & Plan: Visit Diagnoses: No diagnosis found.  Plan: Sherri [name] is a 79 y.o. female with hx of RA who presented to clinic with a cellulitic rash with burning pain and itching concerning for erysipelas. Will d/c keflex and start Clindamycin 300mg  TID x10days. There was concerns for the amount of swelling at her elbow, so we'll start the process of getting a prior authorization for an MRI. Should her symptoms improve prior to the MRI being completed, we can cancel the study.   Meds & Orders: No orders of the defined types were placed in this encounter.  No orders of the defined types were placed in this encounter.   Follow-up: No follow-ups on file.   Procedures: No procedures performed  No notes on file   Clinical History: No specialty comments  available.   She reports that she quit smoking about 38 years ago. Her smoking use included cigarettes. She has a 50.00 pack-year smoking history. She has never used smokeless tobacco. No results for input(s): HGBA1C, LABURIC in the last 8760 hours.  Objective:  VS:  HT:    WT:   BMI:     BP:   HR: bpm  TEMP: ( )  RESP:  Physical Exam  PHYSICAL EXAM: Gen: NAD, alert, cooperative with exam, well-appearing HEENT: clear conjunctiva,  CV:  no edema, capillary refill brisk, normal rate Resp: non-labored Skin: no rashes, normal turgor  Neuro: no gross deficits.  Psych:  alert and oriented   Ortho Exam  Inspection: swollen, purple, well demarcated cellulitic rash extending from the mid forearm to the distal bicep. FROM at both her elbow and shoulder with 5/5 strength intact.   Imaging: No results found.  Past Medical/Family/Surgical/Social History: Medications & Allergies reviewed per EMR, new medications updated. Patient Active Problem List   Diagnosis Date Noted  . Cellulitis of right upper extremity 10/26/2019  . Cervical radiculopathy 06/28/2019  . Carpal tunnel syndrome on both sides 06/28/2019  . Right sided sciatica 04/14/2018  . Trochanteric bursitis of right hip 04/14/2018  . S/P cervical spinal fusion 11/10/2017  . Swelling of left foot 10/26/2017  . Dysphagia 10/26/2017  . Left ankle swelling 10/26/2017  .  Localized swelling, mass, and lump of head 08/18/2017  . Gastroesophageal reflux disease 08/06/2017  . Easy bruising 08/06/2017  . Anxiety 04/24/2017  . Mid back pain 03/26/2017  . Left sided chest pain 03/25/2017  . Dyspnea on exertion 03/14/2017  . Hyperkalemia 03/13/2017  . Submandibular gland swelling 01/08/2017  . High risk medication use 09/02/2016  . Urinary frequency 09/02/2016  . Piriformis muscle pain 09/02/2016  . Fatigue 07/22/2016  . Hair loss 07/22/2016  . Right hip pain 04/08/2016  . Right knee pain 02/25/2016  . Dyspepsia 04/05/2015  .  Numbness 11/14/2014  . Biceps tendon tear 10/10/2014  . Insomnia 10/10/2014  . Gait disturbance 06/13/2014  . 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 OP (osteoporosis) 01/18/2014  . Other long term (current) drug therapy 01/18/2014  . Other dysphagia 12/29/2013  . Hoarseness 12/29/2013  . 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 Personal history of colonic polyps 07/03/2010  . Nocturia 05/28/2010  . THYROID NODULE 05/17/2008  . Multiple sclerosis ([place]) 05/17/2008  . Chronic fatigue 03/30/2007  . Hyperlipidemia 08/20/2006  . Essential hypertension 08/20/2006  . Rheumatoid arthritis ([place]) 08/20/2006  . CERVICAL CANCER, HX OF 08/20/2006  . GASTRIC ULCER, HX OF 08/20/2006   Past Medical History:  Diagnosis Date  . Anxiety    takes Valium daily as needed  . Basal cell carcinoma 01/21/1988   left nostril (MOHS), sup-right calf (CX35FU)  . Basal cell carcinoma 03/22/1996   right calf (CX35FU)  . Basal cell carcinoma 03/31/1995   left wing nose  . Bruises easily    d/t meds  . Cancer ([place])    basal cell ca, in situ- uterine   . Cataracts, bilateral   . Chronic back pain   . Dizziness    r/t to meds  . GERD (gastroesophageal reflux disease)    no meds on a regular basis but will take Tums if needed  . Headache(784.0)    r/t neck issues  . History of bronchitis 6-17yrs ago  . History of colon polyps    benign  . Hyperlipidemia    takes Atorvastatin on Mondays and Fridays  . Hypertension    takes Ramipril daily  . Joint pain   . Joint swelling   . Multiple sclerosis ([place])   . Neuromuscular disorder ([place])    Dr. Park Liter- [facility], follows M.S.  . Nocturia   . OA (osteoarthritis)    Dr Jani Gravel weekly, RA- hands- knees- feet   . PONV (postoperative nausea and vomiting)    trouble urinating after surgery in 2014  . Rheumatoid arthritis(714.0)    Dr Estanislado Pandy takes [name] daily  . Right wrist fracture   . Skin cancer   . Squamous cell  carcinoma of skin 11/02/2001   in situ-right knee (cx92fu)  . Squamous cell carcinoma of skin 11/12/2011   in situ-left forearm (CX35FU), in situ-left foot (CX35FU)  . Squamous cell carcinoma of skin 01/22/2012   in situ-left lower forearm (txpbx)  . Squamous cell carcinoma of skin 11/17/2012   right shin (txpbx)  . Squamous cell carcinoma of skin 10/28/2013   in situ-right shoulder (CX35FU)  . Squamous cell carcinoma of skin 04/28/2014   well diff-right shin (txpbx)  . Squamous cell carcinoma of skin 11/02/2014   in situ-Left shin (txpbx)  . Squamous cell carcinoma of skin 12/15/2014   in situ-Left hand (txpbx), bowens-left side chest (txpbx)  . Squamous cell carcinoma of skin 05/09/2015   in situ-left hand (txpbx)   .  Squamous cell carcinoma of skin 05/07/2016   in situ-left inner shin,ant, in situ-left inner shin, post, in situ-left outer forearm, in situ-right knuckle  . Squamous cell carcinoma of skin 03/1302018   in situ-left shoulder (txpbx), in situ-right inner shin (txpbx), in situ-top of left foot (txpbx), KA- left forearm (txpbx)  . Squamous cell carcinoma of skin 12/02/2016   in situ-left inner shin (txpbx)  . Squamous cell carcinoma of skin 03/04/2017   in situ-left outer sup, shin (txpbx), in situ- right 2nd knuckle finger (txpbx), in situ- Left wrist (txpbx)  . Squamous cell carcinoma of skin 04/06/2018   in situ-above left knee inner (txpbx)  . Squamous cell carcinoma of skin 04/21/2018   in situ-right top hand (txpbx)  . Squamous cell carcinoma of skin 07/08/2018   in situ-right lower inner shin (txpbx)  . Squamous cell carcinoma of skin 04/27/2019   in situ- Left neck(CX35FU), In situ- right neck (CX35FU)  . Urinary retention    sees Dr.Wrenn about 2 times a yr   Family History  Problem Relation Age of Onset  . Cancer Mother        pancreatic  . Diabetes Mother   . Heart disease Father        Rheumatic  . Cancer Father        ? stomach  . Cancer Sister         stomach  . Cancer Maternal Aunt        X 4; ? primary  . Heart disease Paternal Aunt   . Cancer Maternal Grandmother        cervical  . Colon cancer Other        Aunts  . Multiple sclerosis Daughter   . Hypertension Neg Hx   . Stroke Neg Hx    Past Surgical History:  Procedure Laterality Date  . ABDOMINAL HYSTERECTOMY     1985  . APPENDECTOMY     with TAH  . BACK SURGERY     several  . BREAST BIOPSY Right    Results were negative  . BROW LIFT Bilateral 10/02/2016   Procedure: BILATERAL LOWER LID BLEPHAROPLASTY;  Surgeon: Clista Bernhardt, MD;  Location: [facility];  Service: Plastics;  Laterality: Bilateral;  . CARPAL TUNNEL RELEASE Right   . cataracts    . CERVICAL FUSION     Dr Joya Salm  . CERVICAL FUSION  12/31/2012   Dr Joya Salm  . CHEST TUBE INSERTION     for traumatic Pneumothorax  . COLONOSCOPY  07/16/2010   normal   . COLONOSCOPY W/ POLYPECTOMY  1997   negative since; Dr Deatra Ina  . ESOPHAGOGASTRODUODENOSCOPY  07/16/2010   normal  . eye lid raise    . EYE SURGERY Bilateral    cataracts removed - /w IOL  . JOINT REPLACEMENT    . LUMBAR FUSION     Dr Joya Salm  . NASAL SINUS SURGERY    . POSTERIOR CERVICAL FUSION/FORAMINOTOMY N/A 12/31/2012   Procedure: POSTERIOR LATERAL CERVICAL FUSION/FORAMINOTOMY LEVEL 1 CERVICAL THREE-FOUR WITH LATERAL MASS SCREWS;  Surgeon: Floyce Stakes, MD;  Location: [facility];  Service: Neurosurgery;  Laterality: N/A;  . PTOSIS REPAIR Bilateral 10/02/2016   Procedure: INTERNAL PTOSIS REPAIR;  Surgeon: Clista Bernhardt, MD;  Location: [facility];  Service: Plastics;  Laterality: Bilateral;  . SKIN BIOPSY    . THYROID SURGERY     R lobe removed- 1972, has grown back - CT last done- 2018  . TOTAL HIP ARTHROPLASTY  12/22/2011   Procedure: TOTAL HIP ARTHROPLASTY;  Surgeon: Kerin Salen, MD;  Location: [facility];  Service: Orthopedics;  Laterality: Left;  . TOTAL SHOULDER ARTHROPLASTY    . TUBAL LIGATION     Social History   Occupational  History  . Occupation: retired  Tobacco Use  . Smoking status: Former Smoker    Packs/day: 2.50    Years: 20.00    Pack years: 50.00    Types: Cigarettes    Quit date: 05/26/1981    Years since quitting: 38.4  . Smokeless tobacco: Never Used  . Tobacco comment: Smoked [id number], up to 2.5 ppd  Substance and Sexual Activity  . Alcohol use: Yes    Alcohol/week: 14.0 standard drinks    Types: 14 Cans of beer per week    Comment: 2% beer  . Drug use: Never  . Sexual activity: Yes

## 2019-11-01 NOTE — Telephone Encounter (Signed)
Last Visit: 10/28/2019 Next Visit: 12/06/2019  Current Dose per office note on 10/28/2019: prednisone 5 mg p.o. daily.  Okay to refill per Dr. Estanislado Pandy

## 2019-11-02 MED ORDER — METHYLPREDNISOLONE 4 MG PO TBPK: ORAL_TABLET | ORAL | 0 refills | Status: DC

## 2019-11-02 NOTE — Addendum Note (Signed)
Addended by: Hortencia Pilar on: 11/02/2019 08:07 AM   Modules accepted: Orders

## 2019-11-02 NOTE — Progress Notes (Signed)
Pt informed and stated understanding

## 2019-11-08 MED FILL — OLUMIANT 2 MG TABS: 2 | 30 days supply | Qty: 30 | Fill #1

## 2019-11-08 NOTE — Addendum Note (Signed)
Addended by: Robyne Askew R on: 11/08/2019 08:08 AM   Modules accepted: Level of Service

## 2019-11-10 ENCOUNTER — Other Ambulatory Visit: Payer: Self-pay | Admitting: Internal Medicine

## 2019-11-16 DIAGNOSIS — R070 Pain in throat: Secondary | ICD-10-CM | POA: Diagnosis not present

## 2019-11-16 DIAGNOSIS — K219 Gastro-esophageal reflux disease without esophagitis: Secondary | ICD-10-CM | POA: Diagnosis not present

## 2019-11-16 DIAGNOSIS — R490 Dysphonia: Secondary | ICD-10-CM | POA: Diagnosis not present

## 2019-11-23 ENCOUNTER — Other Ambulatory Visit: Payer: Self-pay | Admitting: Internal Medicine

## 2019-11-23 ENCOUNTER — Other Ambulatory Visit: Payer: Self-pay | Admitting: Family Medicine

## 2019-11-23 MED ORDER — OXYCODONE HCL 10 MG PO TABS: ORAL_TABLET | ORAL | 0 refills | Status: DC

## 2019-11-23 NOTE — Addendum Note (Signed)
Addended by: Wyvonnia Lora on: 11/23/2019 02:35 PM   Modules accepted: Orders

## 2019-11-23 NOTE — Telephone Encounter (Signed)
Pt has called for a refill on her Oxycodone HCl 10 MG TABS to [facility]

## 2019-11-25 ENCOUNTER — Ambulatory Visit (INDEPENDENT_AMBULATORY_CARE_PROVIDER_SITE_OTHER): Payer: Medicare Other | Admitting: Family Medicine

## 2019-11-25 ENCOUNTER — Telehealth: Payer: Self-pay | Admitting: Family Medicine

## 2019-11-25 ENCOUNTER — Encounter: Payer: Self-pay | Admitting: Family Medicine

## 2019-11-25 ENCOUNTER — Other Ambulatory Visit: Payer: Self-pay | Admitting: Rheumatology

## 2019-11-25 ENCOUNTER — Other Ambulatory Visit: Payer: Self-pay

## 2019-11-25 DIAGNOSIS — G5632 Lesion of radial nerve, left upper limb: Secondary | ICD-10-CM | POA: Diagnosis not present

## 2019-11-25 NOTE — Telephone Encounter (Signed)
I called and advised the patient. She voiced understanding. 

## 2019-11-25 NOTE — Telephone Encounter (Signed)
Last Visit: 10/28/2019 Next Visit: 12/06/2019  Current Dose per office note on 10/28/2019: prednisone 5 mg p.o. daily.  Okay to refill per Dr. Estanislado Pandy

## 2019-11-25 NOTE — Telephone Encounter (Signed)
The topical steroid that I discussed with her is iontophoresis, which is done by the physical therapist once she gets in to see her.

## 2019-11-25 NOTE — Progress Notes (Deleted)
Office Visit Note  Patient: Sherri [name]             Date of Birth: 1940/06/11           MRN: [id number]             PCP: Binnie Rail, MD Referring: Binnie Rail, MD Visit Date: 12/06/2019 Occupation: @GUAROCC @  Subjective:  No chief complaint on file.   History of Present Illness: Sherri [name] is a 79 y.o. female ***   Activities of Daily Living:  Patient reports morning stiffness for *** {minute/hour:19697}.   Patient {ACTIONS;DENIES/REPORTS:21021675::"Denies"} nocturnal pain.  Difficulty dressing/grooming: {ACTIONS;DENIES/REPORTS:21021675::"Denies"} Difficulty climbing stairs: {ACTIONS;DENIES/REPORTS:21021675::"Denies"} Difficulty getting out of chair: {ACTIONS;DENIES/REPORTS:21021675::"Denies"} Difficulty using hands for taps, buttons, cutlery, and/or writing: {ACTIONS;DENIES/REPORTS:21021675::"Denies"}  No Rheumatology ROS completed.   PMFS History:  Patient Active Problem List   Diagnosis Date Noted  . Cellulitis of right upper extremity 10/26/2019  . Cervical radiculopathy 06/28/2019  . Carpal tunnel syndrome on both sides 06/28/2019  . Right sided sciatica 04/14/2018  . Trochanteric bursitis of right hip 04/14/2018  . S/P cervical spinal fusion 11/10/2017  . Swelling of left foot 10/26/2017  . Dysphagia 10/26/2017  . Left ankle swelling 10/26/2017  . Localized swelling, mass, and lump of head 08/18/2017  . Gastroesophageal reflux disease 08/06/2017  . Easy bruising 08/06/2017  . Anxiety 04/24/2017  . Mid back pain 03/26/2017  . Left sided chest pain 03/25/2017  . Dyspnea on exertion 03/14/2017  . Hyperkalemia 03/13/2017  . Submandibular gland swelling 01/08/2017  . High risk medication use 09/02/2016  . Urinary frequency 09/02/2016  . Piriformis muscle pain 09/02/2016  . Fatigue 07/22/2016  . Hair loss 07/22/2016  . Right hip pain 04/08/2016  . Right knee pain 02/25/2016  . Dyspepsia 04/05/2015  . Numbness 11/14/2014  . Biceps tendon  tear 10/10/2014  . Insomnia 10/10/2014  . Gait disturbance 06/13/2014  . 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 OP (osteoporosis) 01/18/2014  . Other long term (current) drug therapy 01/18/2014  . Other dysphagia 12/29/2013  . Hoarseness 12/29/2013  . 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 Personal history of colonic polyps 07/03/2010  . Nocturia 05/28/2010  . THYROID NODULE 05/17/2008  . Multiple sclerosis ([place]) 05/17/2008  . Chronic fatigue 03/30/2007  . Hyperlipidemia 08/20/2006  . Essential hypertension 08/20/2006  . Rheumatoid arthritis ([place]) 08/20/2006  . CERVICAL CANCER, HX OF 08/20/2006  . GASTRIC ULCER, HX OF 08/20/2006    Past Medical History:  Diagnosis Date  . Anxiety    takes Valium daily as needed  . Basal cell carcinoma 01/21/1988   left nostril (MOHS), sup-right calf (CX35FU)  . Basal cell carcinoma 03/22/1996   right calf (CX35FU)  . Basal cell carcinoma 03/31/1995   left wing nose  . Bruises easily    d/t meds  . Cancer ([place])    basal cell ca, in situ- uterine   . Cataracts, bilateral   . Chronic back pain   . Dizziness    r/t to meds  . GERD (gastroesophageal reflux disease)    no meds on a regular basis but will take Tums if needed  . Headache(784.0)    r/t neck issues  . History of bronchitis 6-48yrs ago  . History of colon polyps    benign  . Hyperlipidemia    takes Atorvastatin on Mondays and Fridays  . Hypertension    takes Ramipril daily  . Joint pain   . Joint swelling   .  Multiple sclerosis ([place])   . Neuromuscular disorder ([place])    Dr. Park Liter- [facility], follows M.S.  . Nocturia   . OA (osteoarthritis)    Dr Jani Gravel weekly, RA- hands- knees- feet   . PONV (postoperative nausea and vomiting)    trouble urinating after surgery in 2014  . Rheumatoid arthritis(714.0)    Dr Estanislado Pandy takes [name] daily  . Right wrist fracture   . Skin cancer   . Squamous cell carcinoma of skin 11/02/2001   in  situ-right knee (cx6fu)  . Squamous cell carcinoma of skin 11/12/2011   in situ-left forearm (CX35FU), in situ-left foot (CX35FU)  . Squamous cell carcinoma of skin 01/22/2012   in situ-left lower forearm (txpbx)  . Squamous cell carcinoma of skin 11/17/2012   right shin (txpbx)  . Squamous cell carcinoma of skin 10/28/2013   in situ-right shoulder (CX35FU)  . Squamous cell carcinoma of skin 04/28/2014   well diff-right shin (txpbx)  . Squamous cell carcinoma of skin 11/02/2014   in situ-Left shin (txpbx)  . Squamous cell carcinoma of skin 12/15/2014   in situ-Left hand (txpbx), bowens-left side chest (txpbx)  . Squamous cell carcinoma of skin 05/09/2015   in situ-left hand (txpbx)   . Squamous cell carcinoma of skin 05/07/2016   in situ-left inner shin,ant, in situ-left inner shin, post, in situ-left outer forearm, in situ-right knuckle  . Squamous cell carcinoma of skin 03/1302018   in situ-left shoulder (txpbx), in situ-right inner shin (txpbx), in situ-top of left foot (txpbx), KA- left forearm (txpbx)  . Squamous cell carcinoma of skin 12/02/2016   in situ-left inner shin (txpbx)  . Squamous cell carcinoma of skin 03/04/2017   in situ-left outer sup, shin (txpbx), in situ- right 2nd knuckle finger (txpbx), in situ- Left wrist (txpbx)  . Squamous cell carcinoma of skin 04/06/2018   in situ-above left knee inner (txpbx)  . Squamous cell carcinoma of skin 04/21/2018   in situ-right top hand (txpbx)  . Squamous cell carcinoma of skin 07/08/2018   in situ-right lower inner shin (txpbx)  . Squamous cell carcinoma of skin 04/27/2019   in situ- Left neck(CX35FU), In situ- right neck (CX35FU)  . Urinary retention    sees Dr.Wrenn about 2 times a yr    Family History  Problem Relation Age of Onset  . Cancer Mother        pancreatic  . Diabetes Mother   . Heart disease Father        Rheumatic  . Cancer Father        ? stomach  . Cancer Sister        stomach  . Cancer  Maternal Aunt        X 4; ? primary  . Heart disease Paternal Aunt   . Cancer Maternal Grandmother        cervical  . Colon cancer Other        Aunts  . Multiple sclerosis Daughter   . Hypertension Neg Hx   . Stroke Neg Hx    Past Surgical History:  Procedure Laterality Date  . ABDOMINAL HYSTERECTOMY     1985  . APPENDECTOMY     with TAH  . BACK SURGERY     several  . BREAST BIOPSY Right    Results were negative  . BROW LIFT Bilateral 10/02/2016   Procedure: BILATERAL LOWER LID BLEPHAROPLASTY;  Surgeon: Clista Bernhardt, MD;  Location: [facility];  Service: Plastics;  Laterality:  Bilateral;  . CARPAL TUNNEL RELEASE Right   . cataracts    . CERVICAL FUSION     Dr Joya Salm  . CERVICAL FUSION  12/31/2012   Dr Joya Salm  . CHEST TUBE INSERTION     for traumatic Pneumothorax  . COLONOSCOPY  07/16/2010   normal   . COLONOSCOPY W/ POLYPECTOMY  1997   negative since; Dr Deatra Ina  . ESOPHAGOGASTRODUODENOSCOPY  07/16/2010   normal  . eye lid raise    . EYE SURGERY Bilateral    cataracts removed - /w IOL  . JOINT REPLACEMENT    . LUMBAR FUSION     Dr Joya Salm  . NASAL SINUS SURGERY    . POSTERIOR CERVICAL FUSION/FORAMINOTOMY N/A 12/31/2012   Procedure: POSTERIOR LATERAL CERVICAL FUSION/FORAMINOTOMY LEVEL 1 CERVICAL THREE-FOUR WITH LATERAL MASS SCREWS;  Surgeon: Floyce Stakes, MD;  Location: [place];  Service: Neurosurgery;  Laterality: N/A;  . PTOSIS REPAIR Bilateral 10/02/2016   Procedure: INTERNAL PTOSIS REPAIR;  Surgeon: Clista Bernhardt, MD;  Location: [facility];  Service: Plastics;  Laterality: Bilateral;  . SKIN BIOPSY    . THYROID SURGERY     R lobe removed- 1972, has grown back - CT last done- 2018  . TOTAL HIP ARTHROPLASTY  12/22/2011   Procedure: TOTAL HIP ARTHROPLASTY;  Surgeon: Kerin Salen, MD;  Location: [facility];  Service: Orthopedics;  Laterality: Left;  . TOTAL SHOULDER ARTHROPLASTY    . TUBAL LIGATION     Social History   Social History Narrative  . Not on file    Immunization History  Administered Date(s) Administered  . Influenza Whole 02/22/2007, 02/08/2013  . Influenza, High Dose Seasonal PF 02/04/2015, 02/18/2017, 02/21/2018, 01/16/2019  . Influenza-Unspecified 01/24/2014, 01/25/2016, 02/18/2017, 01/16/2019  . PFIZER SARS-COV-2 Vaccination 06/18/2019, 07/09/2019  . Pneumococcal Conjugate-13 11/23/2012, 09/08/2014  . Pneumococcal Polysaccharide-23 05/26/2006  . Pneumococcal-Unspecified 12/28/2012  . Td 05/28/2010  . Zoster 05/26/2009  . Zoster Recombinat (Shingrix) 02/10/2019, 04/14/2019     Objective: Vital Signs: There were no vitals taken for this visit.   Physical Exam   Musculoskeletal Exam: ***  CDAI Exam: CDAI Score: -- Patient Global: --; Provider Global: -- Swollen: --; Tender: -- Joint Exam 12/06/2019   No joint exam has been documented for this visit   There is currently no information documented on the homunculus. Go to the Rheumatology activity and complete the homunculus joint exam.  Investigation: No additional findings.  Imaging: No results found.  Recent Labs: Lab Results  Component Value Date   WBC 5.2 10/30/2019   HGB 12.0 10/30/2019   PLT 284 10/30/2019   NA 137 10/30/2019   K 3.8 10/30/2019   CL 104 10/30/2019   CO2 25 10/30/2019   GLUCOSE 100 (H) 10/30/2019   BUN 9 10/30/2019   CREATININE 0.93 10/30/2019   BILITOT 0.4 10/30/2019   ALKPHOS 40 10/30/2019   AST 27 10/30/2019   ALT 21 10/30/2019   PROT 6.5 10/30/2019   ALBUMIN 3.5 10/30/2019   CALCIUM 9.0 10/30/2019   GFRAA >60 10/30/2019   QFTBGOLDPLUS NEGATIVE 10/04/2019    Speciality Comments: Prior therapy includes: [name] (cost), [name] (cost)  Procedures:  No procedures performed Allergies: Darvon [propoxyphene hcl], Meperidine hcl, Penicillins, Azathioprine, Leflunomide, and Ezetimibe-simvastatin   Assessment / Plan:     Visit Diagnoses: No diagnosis found.  Orders: No orders of the defined types were placed in this  encounter.  No orders of the defined types were placed in this encounter.   Face-to-face  time spent with patient was *** minutes. Greater than 50% of time was spent in counseling and coordination of care.  Follow-Up Instructions: No follow-ups on file.   Ofilia Neas, PA-C  Note - This record has been created using Dragon software.  Chart creation errors have been sought, but may not always  have been located. Such creation errors do not reflect on  the standard of medical care.

## 2019-11-25 NOTE — Telephone Encounter (Signed)
Patient called requesting Dr. Junius Roads change prescription to Prednisone topical cream as discussed. Please send to pharmacy on file. Please call patient when medication has changed. Patient phone number is [phone].

## 2019-11-25 NOTE — Telephone Encounter (Signed)
Please advise 

## 2019-11-25 NOTE — Progress Notes (Signed)
Office Visit Note   Patient: Sherri [name]           Date of Birth: 09-Jan-1941           MRN: [id number] Visit Date: 11/25/2019 Requested by: Binnie Rail, MD [place],  [place] [id number] PCP: Binnie Rail, MD  Subjective: Chief Complaint  Patient presents with  . Left Forearm - Pain    Pain starts in lateral elbow and down the forearm. Has had this for 2 years. Did used to be a Environmental health practitioner."    HPI: She is here with left forearm pain.  Symptoms started about 2 years ago, no injury.  Pain seems to be most intense near the elbow but it shoots pain down toward the hand.  Occasional tingling.  It hurts a lot when she bumps it against something.  She has not taken anything for the pain, hoping it would go away with time.  She is right-hand dominant.  Incidentally her cellulitis seems to have completely resolved.                ROS:   All other systems were reviewed and are negative.  Objective: Vital Signs: There were no vitals taken for this visit.  Physical Exam:  General:  Alert and oriented, in no acute distress. Pulm:  Breathing unlabored. Psy:  Normal mood, congruent affect. Skin: She has no erythema over the left forearm. Left elbow: Full active range of motion, pain-free.  No joint effusion.  She is moderately tender at the common extensor tendon at the lateral epicondyle.  Maximum tenderness is near the radial tunnel.  She has myofascial trigger points in the forearm extensor muscles.  Resisted strength testing does not cause significant pain and she has no weakness.  Imaging: No results found.  Assessment & Plan: 1.  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 -Discussed options with her and elected to refer her to physical therapy for myofascial release techniques, iontophoresis.  We will try a tennis elbow strap.  If she fails to improve we will consider injecting the radial tunnel.     Procedures: No procedures performed  No  notes on file     PMFS History: Patient Active Problem List   Diagnosis Date Noted  . Cellulitis of right upper extremity 10/26/2019  . Cervical radiculopathy 06/28/2019  . Carpal tunnel syndrome on both sides 06/28/2019  . Right sided sciatica 04/14/2018  . Trochanteric bursitis of right hip 04/14/2018  . S/P cervical spinal fusion 11/10/2017  . Swelling of left foot 10/26/2017  . Dysphagia 10/26/2017  . Left ankle swelling 10/26/2017  . Localized swelling, mass, and lump of head 08/18/2017  . Gastroesophageal reflux disease 08/06/2017  . Easy bruising 08/06/2017  . Anxiety 04/24/2017  . Mid back pain 03/26/2017  . Left sided chest pain 03/25/2017  . Dyspnea on exertion 03/14/2017  . Hyperkalemia 03/13/2017  . Submandibular gland swelling 01/08/2017  . High risk medication use 09/02/2016  . Urinary frequency 09/02/2016  . Piriformis muscle pain 09/02/2016  . Fatigue 07/22/2016  . Hair loss 07/22/2016  . Right hip pain 04/08/2016  . Right knee pain 02/25/2016  . Dyspepsia 04/05/2015  . Numbness 11/14/2014  . Biceps tendon tear 10/10/2014  . Insomnia 10/10/2014  . Gait disturbance 06/13/2014  . 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 OP (osteoporosis) 01/18/2014  . Other long term (current) drug therapy 01/18/2014  . Other dysphagia 12/29/2013  . Hoarseness 12/29/2013  .  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 Personal history of colonic polyps 07/03/2010  . Nocturia 05/28/2010  . THYROID NODULE 05/17/2008  . Multiple sclerosis ([place]) 05/17/2008  . Chronic fatigue 03/30/2007  . Hyperlipidemia 08/20/2006  . Essential hypertension 08/20/2006  . Rheumatoid arthritis ([place]) 08/20/2006  . CERVICAL CANCER, HX OF 08/20/2006  . GASTRIC ULCER, HX OF 08/20/2006   Past Medical History:  Diagnosis Date  . Anxiety    takes Valium daily as needed  . Basal cell carcinoma 01/21/1988   left nostril (MOHS), sup-right calf (CX35FU)  . Basal cell carcinoma  03/22/1996   right calf (CX35FU)  . Basal cell carcinoma 03/31/1995   left wing nose  . Bruises easily    d/t meds  . Cancer ([place])    basal cell ca, in situ- uterine   . Cataracts, bilateral   . Chronic back pain   . Dizziness    r/t to meds  . GERD (gastroesophageal reflux disease)    no meds on a regular basis but will take Tums if needed  . Headache(784.0)    r/t neck issues  . History of bronchitis 6-59yrs ago  . History of colon polyps    benign  . Hyperlipidemia    takes Atorvastatin on Mondays and Fridays  . Hypertension    takes Ramipril daily  . Joint pain   . Joint swelling   . Multiple sclerosis ([place])   . Neuromuscular disorder ([place])    Dr. Park Liter- [facility], follows M.S.  . Nocturia   . OA (osteoarthritis)    Dr Jani Gravel weekly, RA- hands- knees- feet   . PONV (postoperative nausea and vomiting)    trouble urinating after surgery in 2014  . Rheumatoid arthritis(714.0)    Dr Estanislado Pandy takes [name] daily  . Right wrist fracture   . Skin cancer   . Squamous cell carcinoma of skin 11/02/2001   in situ-right knee (cx52fu)  . Squamous cell carcinoma of skin 11/12/2011   in situ-left forearm (CX35FU), in situ-left foot (CX35FU)  . Squamous cell carcinoma of skin 01/22/2012   in situ-left lower forearm (txpbx)  . Squamous cell carcinoma of skin 11/17/2012   right shin (txpbx)  . Squamous cell carcinoma of skin 10/28/2013   in situ-right shoulder (CX35FU)  . Squamous cell carcinoma of skin 04/28/2014   well diff-right shin (txpbx)  . Squamous cell carcinoma of skin 11/02/2014   in situ-Left shin (txpbx)  . Squamous cell carcinoma of skin 12/15/2014   in situ-Left hand (txpbx), bowens-left side chest (txpbx)  . Squamous cell carcinoma of skin 05/09/2015   in situ-left hand (txpbx)   . Squamous cell carcinoma of skin 05/07/2016   in situ-left inner shin,ant, in situ-left inner shin, post, in situ-left outer forearm, in situ-right knuckle    . Squamous cell carcinoma of skin 03/1302018   in situ-left shoulder (txpbx), in situ-right inner shin (txpbx), in situ-top of left foot (txpbx), KA- left forearm (txpbx)  . Squamous cell carcinoma of skin 12/02/2016   in situ-left inner shin (txpbx)  . Squamous cell carcinoma of skin 03/04/2017   in situ-left outer sup, shin (txpbx), in situ- right 2nd knuckle finger (txpbx), in situ- Left wrist (txpbx)  . Squamous cell carcinoma of skin 04/06/2018   in situ-above left knee inner (txpbx)  . Squamous cell carcinoma of skin 04/21/2018   in situ-right top hand (txpbx)  . Squamous cell carcinoma of skin 07/08/2018   in situ-right lower inner  shin (txpbx)  . Squamous cell carcinoma of skin 04/27/2019   in situ- Left neck(CX35FU), In situ- right neck (CX35FU)  . Urinary retention    sees Dr.Wrenn about 2 times a yr    Family History  Problem Relation Age of Onset  . Cancer Mother        pancreatic  . Diabetes Mother   . Heart disease Father        Rheumatic  . Cancer Father        ? stomach  . Cancer Sister        stomach  . Cancer Maternal Aunt        X 4; ? primary  . Heart disease Paternal Aunt   . Cancer Maternal Grandmother        cervical  . Colon cancer Other        Aunts  . Multiple sclerosis Daughter   . Hypertension Neg Hx   . Stroke Neg Hx     Past Surgical History:  Procedure Laterality Date  . ABDOMINAL HYSTERECTOMY     1985  . APPENDECTOMY     with TAH  . BACK SURGERY     several  . BREAST BIOPSY Right    Results were negative  . BROW LIFT Bilateral 10/02/2016   Procedure: BILATERAL LOWER LID BLEPHAROPLASTY;  Surgeon: Clista Bernhardt, MD;  Location: [facility];  Service: Plastics;  Laterality: Bilateral;  . CARPAL TUNNEL RELEASE Right   . cataracts    . CERVICAL FUSION     Dr Joya Salm  . CERVICAL FUSION  12/31/2012   Dr Joya Salm  . CHEST TUBE INSERTION     for traumatic Pneumothorax  . COLONOSCOPY  07/16/2010   normal   . COLONOSCOPY W/ POLYPECTOMY   1997   negative since; Dr Deatra Ina  . ESOPHAGOGASTRODUODENOSCOPY  07/16/2010   normal  . eye lid raise    . EYE SURGERY Bilateral    cataracts removed - /w IOL  . JOINT REPLACEMENT    . LUMBAR FUSION     Dr Joya Salm  . NASAL SINUS SURGERY    . POSTERIOR CERVICAL FUSION/FORAMINOTOMY N/A 12/31/2012   Procedure: POSTERIOR LATERAL CERVICAL FUSION/FORAMINOTOMY LEVEL 1 CERVICAL THREE-FOUR WITH LATERAL MASS SCREWS;  Surgeon: Floyce Stakes, MD;  Location: [facility];  Service: Neurosurgery;  Laterality: N/A;  . PTOSIS REPAIR Bilateral 10/02/2016   Procedure: INTERNAL PTOSIS REPAIR;  Surgeon: Clista Bernhardt, MD;  Location: [facility];  Service: Plastics;  Laterality: Bilateral;  . SKIN BIOPSY    . THYROID SURGERY     R lobe removed- 1972, has grown back - CT last done- 2018  . TOTAL HIP ARTHROPLASTY  12/22/2011   Procedure: TOTAL HIP ARTHROPLASTY;  Surgeon: Kerin Salen, MD;  Location: [facility];  Service: Orthopedics;  Laterality: Left;  . TOTAL SHOULDER ARTHROPLASTY    . TUBAL LIGATION     Social History   Occupational History  . Occupation: retired  Tobacco Use  . Smoking status: Former Smoker    Packs/day: 2.50    Years: 20.00    Pack years: 50.00    Types: Cigarettes    Quit date: 05/26/1981    Years since quitting: 38.5  . Smokeless tobacco: Never Used  . Tobacco comment: Smoked [phone], up to 2.5 ppd  Vaping Use  . Vaping Use: Never used  Substance and Sexual Activity  . Alcohol use: Yes    Alcohol/week: 14.0 standard drinks    Types: 14  Cans of beer per week    Comment: 2% beer  . Drug use: Never  . Sexual activity: Yes

## 2019-11-30 ENCOUNTER — Telehealth: Payer: Self-pay

## 2019-11-30 ENCOUNTER — Other Ambulatory Visit: Payer: Self-pay | Admitting: Internal Medicine

## 2019-11-30 DIAGNOSIS — N3010 Interstitial cystitis (chronic) without hematuria: Secondary | ICD-10-CM | POA: Diagnosis not present

## 2019-11-30 DIAGNOSIS — R3914 Feeling of incomplete bladder emptying: Secondary | ICD-10-CM | POA: Diagnosis not present

## 2019-11-30 DIAGNOSIS — R8271 Bacteriuria: Secondary | ICD-10-CM | POA: Diagnosis not present

## 2019-11-30 DIAGNOSIS — R311 Benign essential microscopic hematuria: Secondary | ICD-10-CM | POA: Diagnosis not present

## 2019-11-30 DIAGNOSIS — Z79899 Other long term (current) drug therapy: Secondary | ICD-10-CM

## 2019-11-30 NOTE — Telephone Encounter (Signed)
Attempted to contact patient and left message on machine to advise patient she is due to update labs prior to prolia injection. Standing orders placed.

## 2019-12-01 ENCOUNTER — Other Ambulatory Visit: Payer: Self-pay

## 2019-12-01 ENCOUNTER — Ambulatory Visit (INDEPENDENT_AMBULATORY_CARE_PROVIDER_SITE_OTHER): Payer: Medicare Other | Admitting: Physical Therapy

## 2019-12-01 ENCOUNTER — Encounter: Payer: Self-pay | Admitting: Physical Therapy

## 2019-12-01 DIAGNOSIS — M810 Age-related osteoporosis without current pathological fracture: Secondary | ICD-10-CM

## 2019-12-01 DIAGNOSIS — M25522 Pain in left elbow: Secondary | ICD-10-CM | POA: Diagnosis not present

## 2019-12-01 DIAGNOSIS — M79602 Pain in left arm: Secondary | ICD-10-CM

## 2019-12-01 DIAGNOSIS — Z79899 Other long term (current) drug therapy: Secondary | ICD-10-CM

## 2019-12-01 LAB — CBC WITH DIFFERENTIAL/PLATELET
Absolute Monocytes: 480 cells/uL (ref 200–950)
Basophils Absolute: 20 cells/uL (ref 0–200)
Basophils Relative: 0.4 %
Eosinophils Absolute: 10 cells/uL — ABNORMAL LOW (ref 15–500)
Eosinophils Relative: 0.2 %
HCT: 35.1 % (ref 35.0–45.0)
Hemoglobin: 12.2 g/dL (ref 11.7–15.5)
Lymphs Abs: 820 cells/uL — ABNORMAL LOW (ref 850–3900)
MCH: 36 pg — ABNORMAL HIGH (ref 27.0–33.0)
MCHC: 34.8 g/dL (ref 32.0–36.0)
MCV: 103.5 fL — ABNORMAL HIGH (ref 80.0–100.0)
MPV: 9.5 fL (ref 7.5–12.5)
Monocytes Relative: 9.6 %
Neutro Abs: 3670 cells/uL (ref 1500–7800)
Neutrophils Relative %: 73.4 %
Platelets: 353 10*3/uL (ref 140–400)
RBC: 3.39 10*6/uL — ABNORMAL LOW (ref 3.80–5.10)
RDW: 13.3 % (ref 11.0–15.0)
Total Lymphocyte: 16.4 %
WBC: 5 10*3/uL (ref 3.8–10.8)

## 2019-12-01 LAB — COMPLETE METABOLIC PANEL WITH GFR
AG Ratio: 1.9 (calc) (ref 1.0–2.5)
ALT: 18 U/L (ref 6–29)
AST: 27 U/L (ref 10–35)
Albumin: 4.1 g/dL (ref 3.6–5.1)
Alkaline phosphatase (APISO): 44 U/L (ref 37–153)
BUN/Creatinine Ratio: 14 (calc) (ref 6–22)
BUN: 14 mg/dL (ref 7–25)
CO2: 29 mmol/L (ref 20–32)
Calcium: 9.4 mg/dL (ref 8.6–10.4)
Chloride: 101 mmol/L (ref 98–110)
Creat: 0.99 mg/dL — ABNORMAL HIGH (ref 0.60–0.93)
GFR, Est African American: 64 mL/min/{1.73_m2} (ref >=60)
GFR, Est Non African American: 55 mL/min/{1.73_m2} — ABNORMAL LOW (ref >=60)
Globulin: 2.2 g/dL (calc) (ref 1.9–3.7)
Glucose, Bld: 96 mg/dL (ref 65–99)
Potassium: 4.8 mmol/L (ref 3.5–5.3)
Sodium: 136 mmol/L (ref 135–146)
Total Bilirubin: 0.5 mg/dL (ref 0.2–1.2)
Total Protein: 6.3 g/dL (ref 6.1–8.1)

## 2019-12-01 NOTE — Patient Instructions (Signed)
Access Code: [email or web address] Y URL: [email or web address] Date: 12/01/2019 Prepared by: Lyndee Hensen  Exercises Seated Wrist Flexion Stretch - 2 x daily - 3 reps - 30 hold Seated Wrist Extension Stretch - 2 x daily - 3 reps - 30 hold Ulnar Nerve Flossing - 2 x daily - 2 sets - 10 reps Supine Single Arm Pectoralis Stretch - 2 x daily - 2 sets - 10 reps

## 2019-12-02 ENCOUNTER — Telehealth: Payer: Self-pay | Admitting: Rheumatology

## 2019-12-02 MED ORDER — PROLIA 60 MG/ML ~~LOC~~ SOSY
PREFILLED_SYRINGE | SUBCUTANEOUS | 0 refills | Status: DC
Start: 2019-12-02 — End: 2020-06-29

## 2019-12-02 NOTE — Telephone Encounter (Signed)
Patient left a message stating she made an appointment for her next Prolia injection for 12/08/2019. Patient would like to reschedule that to 12/07/2019, if possible. Please call to advise.

## 2019-12-02 NOTE — Telephone Encounter (Signed)
Patient rescheduled for 12/07/2019 at 10:30 am.

## 2019-12-02 NOTE — Progress Notes (Signed)
CBC/CMP stable and within normal limits to receive Prolia injection. Prescription sent to [facility] and will be delivered to clinic.  Please notify patient of results and schedule an appointment for administration on or after 7/14.

## 2019-12-02 NOTE — Progress Notes (Deleted)
Pharmacy Note  Subjective:   Patient presents to clinic today to receive bi-annual dose of Prolia.  Patient running a fever or have signs/symptoms of infection? {yes/no:20286}  Patient currently on antibiotics for the treatment of infection? {yes/no:20286}  Patient had fall in the last 6 months?  {yes/no:20286}  If yes, did it require medical attention? {yes/no:20286}   Patient taking calcium 1200 mg daily through diet or supplement and at least 800 units vitamin D? {yes/no:20286}  Objective: CMP     Component Value Date/Time   NA 136 12/01/2019 1347   NA 139 06/20/2019 1335   NA 136 02/22/2015 0000   K 4.8 12/01/2019 1347   K 4.4 02/22/2015 0000   CL 101 12/01/2019 1347   CL 98 02/22/2015 0000   CO2 29 12/01/2019 1347   CO2 30 02/22/2015 0000   GLUCOSE 96 12/01/2019 1347   BUN 14 12/01/2019 1347   BUN 10 06/20/2019 1335   CREATININE 0.99 (H) 12/01/2019 1347   CALCIUM 9.4 12/01/2019 1347   CALCIUM 9.6 02/22/2015 0000   PROT 6.3 12/01/2019 1347   PROT 6.4 06/20/2019 1335   PROT 6.6 02/22/2015 0000   ALBUMIN 3.5 10/30/2019 1211   ALBUMIN 4.2 06/20/2019 1335   ALBUMIN 4.3 02/22/2015 0000   AST 27 12/01/2019 1347   AST 24 02/22/2015 0000   ALT 18 12/01/2019 1347   ALT 18 02/22/2015 0000   ALKPHOS 40 10/30/2019 1211   ALKPHOS 34 02/22/2015 0000   BILITOT 0.5 12/01/2019 1347   BILITOT 0.3 06/20/2019 1335   GFRNONAA 55 (L) 12/01/2019 1347   GFRAA 64 12/01/2019 1347    CBC    Component Value Date/Time   WBC 5.0 12/01/2019 1347   RBC 3.39 (L) 12/01/2019 1347   HGB 12.2 12/01/2019 1347   HGB 13.0 06/20/2019 1335   HCT 35.1 12/01/2019 1347   HCT 38.0 06/20/2019 1335   PLT 353 12/01/2019 1347   PLT 363 06/20/2019 1335   MCV 103.5 (H) 12/01/2019 1347   MCV 100 (H) 06/20/2019 1335   MCH 36.0 (H) 12/01/2019 1347   MCHC 34.8 12/01/2019 1347   RDW 13.3 12/01/2019 1347   RDW 13.4 06/20/2019 1335   LYMPHSABS 820 (L) 12/01/2019 1347   LYMPHSABS 0.6 (L) 06/20/2019 1335    MONOABS 0.4 10/30/2019 1211   EOSABS 10 (L) 12/01/2019 1347   EOSABS 0.0 06/20/2019 1335   BASOSABS 20 12/01/2019 1347   BASOSABS 0.0 06/20/2019 1335    Lab Results  Component Value Date   VD25OH 57 08/31/2017    T-score: -3.9 at radius with BMD of 0.457 on 01/11/18 .Negative 7.58% change in BMD from previous DEXA on 10/15/15.  Assessment/Plan:   Patient tolerated injection  ***.   Patient is to return in 10-14 days for labs to monitor for hypocalcemia.  Future orders placed.  Patient is due for repeat Dexa and vitamin D.  ***   All questions encouraged and answered.  Instructed patient to call with any further questions or concerns.

## 2019-12-04 ENCOUNTER — Encounter: Payer: Self-pay | Admitting: Physical Therapy

## 2019-12-04 NOTE — Therapy (Signed)
[facility] [address] [place], [place], [address] Phone: [phone]   Fax:  [phone]  Physical Therapy Evaluation  Patient Details  Name: Sherri [name] MRN: [id number] Date of Birth: 1941-02-15 Referring Provider (PT): Eunice Blase   Encounter Date: 12/01/2019   PT End of Session - 12/04/19 1321    Visit Number 1    Number of Visits 12    Date for PT Re-Evaluation 01/12/20    Authorization Type U[employer]    PT Start Time 1217    PT Stop Time 1258    PT Time Calculation (min) 41 min    Activity Tolerance Patient tolerated treatment well    Behavior During Therapy [facility] for tasks assessed/performed           Past Medical History:  Diagnosis Date  . Anxiety    takes Valium daily as needed  . Basal cell carcinoma 01/21/1988   left nostril (MOHS), sup-right calf (CX35FU)  . Basal cell carcinoma 03/22/1996   right calf (CX35FU)  . Basal cell carcinoma 03/31/1995   left wing nose  . Bruises easily    d/t meds  . Cancer ([place])    basal cell ca, in situ- uterine   . Cataracts, bilateral   . Chronic back pain   . Dizziness    r/t to meds  . GERD (gastroesophageal reflux disease)    no meds on a regular basis but will take Tums if needed  . Headache(784.0)    r/t neck issues  . History of bronchitis 6-44yrs ago  . History of colon polyps    benign  . Hyperlipidemia    takes Atorvastatin on Mondays and Fridays  . Hypertension    takes Ramipril daily  . Joint pain   . Joint swelling   . Multiple sclerosis ([place])   . Neuromuscular disorder ([place])    Dr. Park Liter- [facility], follows M.S.  . Nocturia   . OA (osteoarthritis)    Dr Jani Gravel weekly, RA- hands- knees- feet   . PONV (postoperative nausea and vomiting)    trouble urinating after surgery in 2014  . Rheumatoid arthritis(714.0)    Dr Estanislado Pandy takes [name] daily  . Right wrist fracture   . Skin cancer   . Squamous cell carcinoma of skin  11/02/2001   in situ-right knee (cx11fu)  . Squamous cell carcinoma of skin 11/12/2011   in situ-left forearm (CX35FU), in situ-left foot (CX35FU)  . Squamous cell carcinoma of skin 01/22/2012   in situ-left lower forearm (txpbx)  . Squamous cell carcinoma of skin 11/17/2012   right shin (txpbx)  . Squamous cell carcinoma of skin 10/28/2013   in situ-right shoulder (CX35FU)  . Squamous cell carcinoma of skin 04/28/2014   well diff-right shin (txpbx)  . Squamous cell carcinoma of skin 11/02/2014   in situ-Left shin (txpbx)  . Squamous cell carcinoma of skin 12/15/2014   in situ-Left hand (txpbx), bowens-left side chest (txpbx)  . Squamous cell carcinoma of skin 05/09/2015   in situ-left hand (txpbx)   . Squamous cell carcinoma of skin 05/07/2016   in situ-left inner shin,ant, in situ-left inner shin, post, in situ-left outer forearm, in situ-right knuckle  . Squamous cell carcinoma of skin 03/1302018   in situ-left shoulder (txpbx), in situ-right inner shin (txpbx), in situ-top of left foot (txpbx), KA- left forearm (txpbx)  . Squamous cell carcinoma of skin 12/02/2016   in situ-left inner shin (txpbx)  . Squamous cell carcinoma  of skin 03/04/2017   in situ-left outer sup, shin (txpbx), in situ- right 2nd knuckle finger (txpbx), in situ- Left wrist (txpbx)  . Squamous cell carcinoma of skin 04/06/2018   in situ-above left knee inner (txpbx)  . Squamous cell carcinoma of skin 04/21/2018   in situ-right top hand (txpbx)  . Squamous cell carcinoma of skin 07/08/2018   in situ-right lower inner shin (txpbx)  . Squamous cell carcinoma of skin 04/27/2019   in situ- Left neck(CX35FU), In situ- right neck (CX35FU)  . Urinary retention    sees Dr.Wrenn about 2 times a yr    Past Surgical History:  Procedure Laterality Date  . ABDOMINAL HYSTERECTOMY     1985  . APPENDECTOMY     with TAH  . BACK SURGERY     several  . BREAST BIOPSY Right    Results were negative  . BROW LIFT  Bilateral 10/02/2016   Procedure: BILATERAL LOWER LID BLEPHAROPLASTY;  Surgeon: Clista Bernhardt, MD;  Location: [facility];  Service: Plastics;  Laterality: Bilateral;  . CARPAL TUNNEL RELEASE Right   . cataracts    . CERVICAL FUSION     Dr Joya Salm  . CERVICAL FUSION  12/31/2012   Dr Joya Salm  . CHEST TUBE INSERTION     for traumatic Pneumothorax  . COLONOSCOPY  07/16/2010   normal   . COLONOSCOPY W/ POLYPECTOMY  1997   negative since; Dr Deatra Ina  . ESOPHAGOGASTRODUODENOSCOPY  07/16/2010   normal  . eye lid raise    . EYE SURGERY Bilateral    cataracts removed - /w IOL  . JOINT REPLACEMENT    . LUMBAR FUSION     Dr Joya Salm  . NASAL SINUS SURGERY    . POSTERIOR CERVICAL FUSION/FORAMINOTOMY N/A 12/31/2012   Procedure: POSTERIOR LATERAL CERVICAL FUSION/FORAMINOTOMY LEVEL 1 CERVICAL THREE-FOUR WITH LATERAL MASS SCREWS;  Surgeon: Floyce Stakes, MD;  Location: [facility];  Service: Neurosurgery;  Laterality: N/A;  . PTOSIS REPAIR Bilateral 10/02/2016   Procedure: INTERNAL PTOSIS REPAIR;  Surgeon: Clista Bernhardt, MD;  Location: [facility];  Service: Plastics;  Laterality: Bilateral;  . SKIN BIOPSY    . THYROID SURGERY     R lobe removed- 1972, has grown back - CT last done- 2018  . TOTAL HIP ARTHROPLASTY  12/22/2011   Procedure: TOTAL HIP ARTHROPLASTY;  Surgeon: Kerin Salen, MD;  Location: [facility];  Service: Orthopedics;  Laterality: Left;  . TOTAL SHOULDER ARTHROPLASTY    . TUBAL LIGATION      There were no vitals filed for this visit.    Subjective Assessment - 12/04/19 1312    Subjective Pt is R handed. States pain in L elbow for about 2 years, but worsened recently. Pt with significnat pain in lateral elbow, with  L 4-5 digits cramping and "drawing". States difficulty holding, gripping and grasping objects due to pain and weakness. Does have RA, OA and Osteoporosis. Did have cervical fusion in 2014, but MRI from 2019 also shows changes, see below. Pt is retired.    Pertinent History MS,  RA, OA, Ostoporosis, CA. Prev cervical fusion .   Very thin, sensitive skin that bruises easily.    Diagnostic tests MRI from 2019 :  C7-T1: 2 mm anterolisthesis. Disc and facet degeneration with severeleft foraminal encroachment due to spurring. Expected left C8radiculopathy symptoms.    Patient Stated Goals Decreased pain    Currently in Pain? Yes    Pain Score 5  Pain Location Arm    Pain Orientation Left    Pain Descriptors / Indicators Aching;Burning    Pain Type Chronic pain    Pain Onset More than a month ago    Pain Frequency Intermittent    Aggravating Factors  Gripping, holding objects    Pain Relieving Factors Tramadol              [facility] PT Assessment - 12/04/19 0001      Assessment   Medical Diagnosis L elbow Pain     Referring Provider (PT) Legrand Como Hilts    Hand Dominance Right    Prior Therapy not for arm      Precautions   Precaution Comments Skin precautions on forearm.       Prior Function   Level of Independence Independent      Cognition   Overall Cognitive Status Within Functional Limits for tasks assessed      ROM / Strength   AROM / PROM / Strength AROM;Strength      AROM   Overall AROM Comments L shoulder, elbow, wrist: WFL, Hand : mild deficits for dips and pips due to RA.       Strength   Overall Strength Comments shoulder, elbow, wrist: 4/5     Strength Assessment Site Hand    Right/Left hand Right;Left    Right Hand Grip (lbs) 25    Left Hand Grip (lbs) 35      Palpation   Palpation comment Significant pain and tenderness with light palpation to lateral elbow musculature, mild tenderness at medial elbow.                       Objective measurements completed on examination: See above findings.       [facility] Adult PT Treatment/Exercise - 12/04/19 0001      Exercises   Exercises Wrist      Wrist Exercises   Other wrist exercises Wrist flex and ext stretches 30 sec x 2 L;      Other wrist exercises Ulnar nerve  glide, seated x10;  Medial nerve glide, supine pec stretch w wrist flex/ext x 10;       Modalities   Modalities Ultrasound;Iontophoresis      Ultrasound   Ultrasound Location L lateral elbow    Ultrasound Parameters 50%, x 8 min    Ultrasound Goals Pain      Manual Therapy   Manual Therapy Soft tissue mobilization    Soft tissue mobilization Light STM and TFM to L wrist extensors.                   PT Education - 12/04/19 1320    Education Details PT POC, Exam findings, HEP,    Person(s) Educated Patient    Methods Explanation;Demonstration;Tactile cues;Verbal cues;Handout    Comprehension Verbalized understanding;Returned demonstration;Verbal cues required;Tactile cues required            PT Short Term Goals - 12/04/19 1326      PT SHORT TERM GOAL #1   Title Pt to be independent with initial HEP    Time 2    Period Weeks    Status New    Target Date 12/15/19      PT SHORT TERM GOAL #2   Title Pt to report decreased pain in L elbow to 0-3/10 with activity    Time 3    Period Weeks    Status New    Target Date  12/15/19             PT Long Term Goals - 12/04/19 1327      PT LONG TERM GOAL #1   Title Pt to be independent with final HEP    Time 6    Period Weeks    Status New    Target Date 01/12/20      PT LONG TERM GOAL #2   Title Pt to report decreased pain in L elbow to 0-1/10 with activity    Time 6    Period Weeks    Status New    Target Date 01/12/20      PT LONG TERM GOAL #3   Title Pt to demo ability for lifting, carrying, and grasping without pain or deficit, to improve ability for IADLs    Time 6    Period Weeks    Status New    Target Date 01/12/20                  Plan - 12/04/19 1559    Clinical Impression Statement Pt presents with primary complaint of increased pain in L elbow and forearm. She has signfiicant soreness in lateral elbow and forarm with palpation and with elbow, wrist and hand activity. Pt with  decreased ability for grasping, carrying, holding objects due to pain and weakness. She has very sensitive thin skin on forarm and bruisies easily, may have decreased tolerance for modalities, Ionto adhesive patch and taping that would otherwise be beneficial. Pt with + MRI findings in c-spine which may be contributory. Pt to benefit from skilled PT to improve deficits and pain.    Personal Factors and Comorbidities Comorbidity 1;Comorbidity 2    Comorbidities RA, OA, OsteoP, CA, +neck MRI, Sensitive skin on forearms.    Examination-Activity Limitations Reach Overhead;Carry;Hygiene/Grooming;Lift    Examination-Participation Restrictions Yard Work;Cleaning;Meal Prep;Community Activity;Driving;Shop;Laundry    Stability/Clinical Decision Making Evolving/Moderate complexity    Clinical Decision Making Moderate    Rehab Potential Good    PT Frequency 2x / week    PT Duration 6 weeks    PT Treatment/Interventions ADLs/Self Care Home Management;Cryotherapy;Electrical Stimulation;Ultrasound;Traction;Moist Heat;Iontophoresis 4mg /ml Dexamethasone;Functional mobility training;Therapeutic activities;Therapeutic exercise;Neuromuscular re-education;Patient/family education;Manual techniques;Passive range of motion;Dry needling;Splinting;Taping;Joint Manipulations;Spinal Manipulations    PT Next Visit Plan May benefit from Phono/Dex with [place] for pain /  may not tolerate ionto patch adhesive    PT Home Exercise Plan [id number]    Consulted and Agree with Plan of Care Patient           Patient will benefit from skilled therapeutic intervention in order to improve the following deficits and impairments:  Pain, Increased muscle spasms, Decreased range of motion, Decreased strength, Impaired UE functional use, Decreased safety awareness, Impaired flexibility, Decreased activity tolerance  Visit Diagnosis: Pain in left elbow  Pain in left arm     Problem List Patient Active Problem List   Diagnosis Date Noted   . Cellulitis of right upper extremity 10/26/2019  . Cervical radiculopathy 06/28/2019  . Carpal tunnel syndrome on both sides 06/28/2019  . Right sided sciatica 04/14/2018  . Trochanteric bursitis of right hip 04/14/2018  . S/P cervical spinal fusion 11/10/2017  . Swelling of left foot 10/26/2017  . Dysphagia 10/26/2017  . Left ankle swelling 10/26/2017  . Localized swelling, mass, and lump of head 08/18/2017  . Gastroesophageal reflux disease 08/06/2017  . Easy bruising 08/06/2017  . Anxiety 04/24/2017  . Mid back pain 03/26/2017  . Left sided chest pain 03/25/2017  .  Dyspnea on exertion 03/14/2017  . Hyperkalemia 03/13/2017  . Submandibular gland swelling 01/08/2017  . High risk medication use 09/02/2016  . Urinary frequency 09/02/2016  . Piriformis muscle pain 09/02/2016  . Fatigue 07/22/2016  . Hair loss 07/22/2016  . Right hip pain 04/08/2016  . Right knee pain 02/25/2016  . Dyspepsia 04/05/2015  . Numbness 11/14/2014  . Biceps tendon tear 10/10/2014  . Insomnia 10/10/2014  . Gait disturbance 06/13/2014  . 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 OP (osteoporosis) 01/18/2014  . Other long term (current) drug therapy 01/18/2014  . Other dysphagia 12/29/2013  . Hoarseness 12/29/2013  . 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 Personal history of colonic polyps 07/03/2010  . Nocturia 05/28/2010  . THYROID NODULE 05/17/2008  . Multiple sclerosis ([place]) 05/17/2008  . Chronic fatigue 03/30/2007  . Hyperlipidemia 08/20/2006  . Essential hypertension 08/20/2006  . Rheumatoid arthritis ([place]) 08/20/2006  . CERVICAL CANCER, HX OF 08/20/2006  . GASTRIC ULCER, HX OF 08/20/2006    Lyndee Hensen, PT, DPT 4:20 PM  12/04/19    [facility] [place], [place], [address] Phone: [phone]   Fax:  [phone]  Name: Sherri [name] MRN: [id number] Date of Birth: [birth year]

## 2019-12-05 MED FILL — PROLIA 60 MG/ML SOLN: 60 | 180 days supply | Qty: 1 | Fill #0

## 2019-12-05 MED FILL — OLUMIANT 2 MG TABS: 2 | 30 days supply | Qty: 30 | Fill #2

## 2019-12-06 ENCOUNTER — Ambulatory Visit: Payer: Medicare Other | Admitting: Rheumatology

## 2019-12-06 NOTE — Telephone Encounter (Signed)
Medication received from [facility] and placed in fridge.  Mariella Saa, PharmD, [place], CPP Clinical Specialty Pharmacist (Rheumatology and Pulmonology)  12/06/2019 11:56 AM

## 2019-12-07 ENCOUNTER — Other Ambulatory Visit: Payer: Self-pay | Admitting: Rheumatology

## 2019-12-07 ENCOUNTER — Ambulatory Visit: Payer: Medicare Other | Admitting: Physical Therapy

## 2019-12-07 ENCOUNTER — Other Ambulatory Visit: Payer: Self-pay

## 2019-12-07 ENCOUNTER — Ambulatory Visit: Payer: Self-pay

## 2019-12-07 ENCOUNTER — Encounter: Payer: Self-pay | Admitting: Physical Therapy

## 2019-12-07 DIAGNOSIS — M25522 Pain in left elbow: Secondary | ICD-10-CM

## 2019-12-07 DIAGNOSIS — M79602 Pain in left arm: Secondary | ICD-10-CM

## 2019-12-07 NOTE — Therapy (Signed)
[facility] [address] [place], [place], [address] Phone: [phone]   Fax:  [phone]  Physical Therapy Treatment  Patient Details  Name: Sherri [name] MRN: [id number] Date of Birth: 1941/04/06 Referring Provider (PT): Eunice Blase   Encounter Date: 12/07/2019   PT End of Session - 12/07/19 1308    Visit Number 2    Number of Visits 12    Date for PT Re-Evaluation 01/12/20    Authorization Type U[employer]    PT Start Time 1302    PT Stop Time 1343    PT Time Calculation (min) 41 min    Activity Tolerance Patient tolerated treatment well    Behavior During Therapy [facility] for tasks assessed/performed           Past Medical History:  Diagnosis Date  . Anxiety    takes Valium daily as needed  . Basal cell carcinoma 01/21/1988   left nostril (MOHS), sup-right calf (CX35FU)  . Basal cell carcinoma 03/22/1996   right calf (CX35FU)  . Basal cell carcinoma 03/31/1995   left wing nose  . Bruises easily    d/t meds  . Cancer ([place])    basal cell ca, in situ- uterine   . Cataracts, bilateral   . Chronic back pain   . Dizziness    r/t to meds  . GERD (gastroesophageal reflux disease)    no meds on a regular basis but will take Tums if needed  . Headache(784.0)    r/t neck issues  . History of bronchitis 6-57yrs ago  . History of colon polyps    benign  . Hyperlipidemia    takes Atorvastatin on Mondays and Fridays  . Hypertension    takes Ramipril daily  . Joint pain   . Joint swelling   . Multiple sclerosis ([place])   . Neuromuscular disorder ([place])    Dr. Park Liter- [facility], follows M.S.  . Nocturia   . OA (osteoarthritis)    Dr Jani Gravel weekly, RA- hands- knees- feet   . PONV (postoperative nausea and vomiting)    trouble urinating after surgery in 2014  . Rheumatoid arthritis(714.0)    Dr Estanislado Pandy takes [name] daily  . Right wrist fracture   . Skin cancer   . Squamous cell carcinoma of skin  11/02/2001   in situ-right knee (cx56fu)  . Squamous cell carcinoma of skin 11/12/2011   in situ-left forearm (CX35FU), in situ-left foot (CX35FU)  . Squamous cell carcinoma of skin 01/22/2012   in situ-left lower forearm (txpbx)  . Squamous cell carcinoma of skin 11/17/2012   right shin (txpbx)  . Squamous cell carcinoma of skin 10/28/2013   in situ-right shoulder (CX35FU)  . Squamous cell carcinoma of skin 04/28/2014   well diff-right shin (txpbx)  . Squamous cell carcinoma of skin 11/02/2014   in situ-Left shin (txpbx)  . Squamous cell carcinoma of skin 12/15/2014   in situ-Left hand (txpbx), bowens-left side chest (txpbx)  . Squamous cell carcinoma of skin 05/09/2015   in situ-left hand (txpbx)   . Squamous cell carcinoma of skin 05/07/2016   in situ-left inner shin,ant, in situ-left inner shin, post, in situ-left outer forearm, in situ-right knuckle  . Squamous cell carcinoma of skin 03/1302018   in situ-left shoulder (txpbx), in situ-right inner shin (txpbx), in situ-top of left foot (txpbx), KA- left forearm (txpbx)  . Squamous cell carcinoma of skin 12/02/2016   in situ-left inner shin (txpbx)  . Squamous cell carcinoma  of skin 03/04/2017   in situ-left outer sup, shin (txpbx), in situ- right 2nd knuckle finger (txpbx), in situ- Left wrist (txpbx)  . Squamous cell carcinoma of skin 04/06/2018   in situ-above left knee inner (txpbx)  . Squamous cell carcinoma of skin 04/21/2018   in situ-right top hand (txpbx)  . Squamous cell carcinoma of skin 07/08/2018   in situ-right lower inner shin (txpbx)  . Squamous cell carcinoma of skin 04/27/2019   in situ- Left neck(CX35FU), In situ- right neck (CX35FU)  . Urinary retention    sees Dr.Wrenn about 2 times a yr    Past Surgical History:  Procedure Laterality Date  . ABDOMINAL HYSTERECTOMY     1985  . APPENDECTOMY     with TAH  . BACK SURGERY     several  . BREAST BIOPSY Right    Results were negative  . BROW LIFT  Bilateral 10/02/2016   Procedure: BILATERAL LOWER LID BLEPHAROPLASTY;  Surgeon: Clista Bernhardt, MD;  Location: [facility];  Service: Plastics;  Laterality: Bilateral;  . CARPAL TUNNEL RELEASE Right   . cataracts    . CERVICAL FUSION     Dr Joya Salm  . CERVICAL FUSION  12/31/2012   Dr Joya Salm  . CHEST TUBE INSERTION     for traumatic Pneumothorax  . COLONOSCOPY  07/16/2010   normal   . COLONOSCOPY W/ POLYPECTOMY  1997   negative since; Dr Deatra Ina  . ESOPHAGOGASTRODUODENOSCOPY  07/16/2010   normal  . eye lid raise    . EYE SURGERY Bilateral    cataracts removed - /w IOL  . JOINT REPLACEMENT    . LUMBAR FUSION     Dr Joya Salm  . NASAL SINUS SURGERY    . POSTERIOR CERVICAL FUSION/FORAMINOTOMY N/A 12/31/2012   Procedure: POSTERIOR LATERAL CERVICAL FUSION/FORAMINOTOMY LEVEL 1 CERVICAL THREE-FOUR WITH LATERAL MASS SCREWS;  Surgeon: Floyce Stakes, MD;  Location: [facility];  Service: Neurosurgery;  Laterality: N/A;  . PTOSIS REPAIR Bilateral 10/02/2016   Procedure: INTERNAL PTOSIS REPAIR;  Surgeon: Clista Bernhardt, MD;  Location: [facility];  Service: Plastics;  Laterality: Bilateral;  . SKIN BIOPSY    . THYROID SURGERY     R lobe removed- 1972, has grown back - CT last done- 2018  . TOTAL HIP ARTHROPLASTY  12/22/2011   Procedure: TOTAL HIP ARTHROPLASTY;  Surgeon: Kerin Salen, MD;  Location: [facility];  Service: Orthopedics;  Laterality: Left;  . TOTAL SHOULDER ARTHROPLASTY    . TUBAL LIGATION      There were no vitals filed for this visit.   Subjective Assessment - 12/07/19 1305    Subjective Pt states soreness in forearm, and increases soreness in wrist/thumb from trying to stretch elbow.    Currently in Pain? Yes    Pain Score 4     Pain Location Arm    Pain Orientation Left    Pain Descriptors / Indicators Aching;Burning    Pain Type Chronic pain    Pain Onset More than a month ago    Pain Frequency Intermittent                             [facility] Adult PT  Treatment/Exercise - 12/07/19 0001      Wrist Exercises   Wrist Extension 15 reps    Bar Weights/Barbell (Wrist Extension) 2 lbs    Wrist Extension Limitations Slow Eccentric lowering     Other wrist  exercises Wrist flex and ext stretches 30 sec x 2 L;   Rows RTB x 20;     Other wrist exercises Ulnar nerve glide, seated x10;  Medial nerve glide, supine pec stretch w wrist flex/ext x 20      Modalities   Modalities Ultrasound      Ultrasound   Ultrasound Location L lateral elbow    Ultrasound Parameters 50 % with dexamethasone,  x 8 min    Ultrasound Goals Pain      Manual Therapy   Manual therapy comments educatrion for use of tennis ball to massage lateral forearm.     Soft tissue mobilization Light STM and TFM to L wrist extensors, manual and with noodle                    PT Short Term Goals - 12/04/19 1326      PT SHORT TERM GOAL #1   Title Pt to be independent with initial HEP    Time 2    Period Weeks    Status New    Target Date 12/15/19      PT SHORT TERM GOAL #2   Title Pt to report decreased pain in L elbow to 0-3/10 with activity    Time 3    Period Weeks    Status New    Target Date 12/15/19             PT Long Term Goals - 12/04/19 1327      PT LONG TERM GOAL #1   Title Pt to be independent with final HEP    Time 6    Period Weeks    Status New    Target Date 01/12/20      PT LONG TERM GOAL #2   Title Pt to report decreased pain in L elbow to 0-1/10 with activity    Time 6    Period Weeks    Status New    Target Date 01/12/20      PT LONG TERM GOAL #3   Title Pt to demo ability for lifting, carrying, and grasping without pain or deficit, to improve ability for IADLs    Time 6    Period Weeks    Status New    Target Date 01/12/20                 Plan - 12/07/19 2105    Clinical Impression Statement Pt with slightly less tenderness than previous visit. Very tender to palpate lateral elbow. Reviewed HEP for correct  mechanics, and discussed STM and icing at home for pain. Plan to progress as tolerated.    Personal Factors and Comorbidities Comorbidity 1;Comorbidity 2    Comorbidities RA, OA, OsteoP, CA, +neck MRI, Sensitive skin on forearms.    Examination-Activity Limitations Reach Overhead;Carry;Hygiene/Grooming;Lift    Examination-Participation Restrictions Yard Work;Cleaning;Meal Prep;Community Activity;Driving;Shop;Laundry    Stability/Clinical Decision Making Evolving/Moderate complexity    Rehab Potential Good    PT Frequency 2x / week    PT Duration 6 weeks    PT Treatment/Interventions ADLs/Self Care Home Management;Cryotherapy;Electrical Stimulation;Ultrasound;Traction;Moist Heat;Iontophoresis 4mg /ml Dexamethasone;Functional mobility training;Therapeutic activities;Therapeutic exercise;Neuromuscular re-education;Patient/family education;Manual techniques;Passive range of motion;Dry needling;Splinting;Taping;Joint Manipulations;Spinal Manipulations    PT Next Visit Plan May benefit from Phono/Dex with [place] for pain /  may not tolerate ionto patch adhesive    PT Home Exercise Plan [id number]    Consulted and Agree with Plan of Care Patient  Patient will benefit from skilled therapeutic intervention in order to improve the following deficits and impairments:  Pain, Increased muscle spasms, Decreased range of motion, Decreased strength, Impaired UE functional use, Decreased safety awareness, Impaired flexibility, Decreased activity tolerance  Visit Diagnosis: Pain in left elbow  Pain in left arm     Problem List Patient Active Problem List   Diagnosis Date Noted  . Cellulitis of right upper extremity 10/26/2019  . Cervical radiculopathy 06/28/2019  . Carpal tunnel syndrome on both sides 06/28/2019  . Right sided sciatica 04/14/2018  . Trochanteric bursitis of right hip 04/14/2018  . S/P cervical spinal fusion 11/10/2017  . Swelling of left foot 10/26/2017  . Dysphagia 10/26/2017   . Left ankle swelling 10/26/2017  . Localized swelling, mass, and lump of head 08/18/2017  . Gastroesophageal reflux disease 08/06/2017  . Easy bruising 08/06/2017  . Anxiety 04/24/2017  . Mid back pain 03/26/2017  . Left sided chest pain 03/25/2017  . Dyspnea on exertion 03/14/2017  . Hyperkalemia 03/13/2017  . Submandibular gland swelling 01/08/2017  . High risk medication use 09/02/2016  . Urinary frequency 09/02/2016  . Piriformis muscle pain 09/02/2016  . Fatigue 07/22/2016  . Hair loss 07/22/2016  . Right hip pain 04/08/2016  . Right knee pain 02/25/2016  . Dyspepsia 04/05/2015  . Numbness 11/14/2014  . Biceps tendon tear 10/10/2014  . Insomnia 10/10/2014  . Gait disturbance 06/13/2014  . 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 OP (osteoporosis) 01/18/2014  . Other long term (current) drug therapy 01/18/2014  . Other dysphagia 12/29/2013  . Hoarseness 12/29/2013  . 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 Personal history of colonic polyps 07/03/2010  . Nocturia 05/28/2010  . THYROID NODULE 05/17/2008  . Multiple sclerosis ([place]) 05/17/2008  . Chronic fatigue 03/30/2007  . Hyperlipidemia 08/20/2006  . Essential hypertension 08/20/2006  . Rheumatoid arthritis ([place]) 08/20/2006  . CERVICAL CANCER, HX OF 08/20/2006  . GASTRIC ULCER, HX OF 08/20/2006   Lyndee Hensen, PT, DPT 9:07 PM  12/07/19    [facility] [place], [place], [address] Phone: [phone]   Fax:  [phone]  Name: Sherri [name] MRN: [id number] Date of Birth: 19-Jul-1940

## 2019-12-07 NOTE — Telephone Encounter (Signed)
Last Visit: 10/28/2019 Next Visit: 12/28/2019 Labs: 12/01/2019 CBC/CMP stable   Current Dose per office note on 10/28/2019: MTX 6 tablets p.o. weekly  Okay to refill per Dr. Estanislado Pandy

## 2019-12-08 ENCOUNTER — Ambulatory Visit (INDEPENDENT_AMBULATORY_CARE_PROVIDER_SITE_OTHER): Payer: Medicare Other | Admitting: Pharmacist

## 2019-12-08 ENCOUNTER — Ambulatory Visit: Payer: Self-pay

## 2019-12-08 VITALS — BP 124/69 | HR 67

## 2019-12-08 DIAGNOSIS — M810 Age-related osteoporosis without current pathological fracture: Secondary | ICD-10-CM | POA: Diagnosis not present

## 2019-12-08 MED ORDER — DENOSUMAB 60 MG/ML ~~LOC~~ SOSY
60.0000 mg | PREFILLED_SYRINGE | Freq: Once | SUBCUTANEOUS | Status: AC
  Administered 2019-12-08: 60 mg via SUBCUTANEOUS
  Filled 2019-12-08: qty 1

## 2019-12-08 NOTE — Progress Notes (Signed)
Pharmacy Note  Subjective:   Patient presents to clinic today to receive bi-annual dose of Prolia.  Patient running a fever or have signs/symptoms of infection? No  Patient currently on antibiotics for the treatment of infection? No  Patient had fall in the last 6 months?  No  If yes, did it require medical attention? No   Patient taking calcium 1200 mg daily through diet or supplement and at least 800 units vitamin D? No  Objective: CMP     Component Value Date/Time   NA 136 12/01/2019 1347   NA 139 06/20/2019 1335   NA 136 02/22/2015 0000   K 4.8 12/01/2019 1347   K 4.4 02/22/2015 0000   CL 101 12/01/2019 1347   CL 98 02/22/2015 0000   CO2 29 12/01/2019 1347   CO2 30 02/22/2015 0000   GLUCOSE 96 12/01/2019 1347   BUN 14 12/01/2019 1347   BUN 10 06/20/2019 1335   CREATININE 0.99 (H) 12/01/2019 1347   CALCIUM 9.4 12/01/2019 1347   CALCIUM 9.6 02/22/2015 0000   PROT 6.3 12/01/2019 1347   PROT 6.4 06/20/2019 1335   PROT 6.6 02/22/2015 0000   ALBUMIN 3.5 10/30/2019 1211   ALBUMIN 4.2 06/20/2019 1335   ALBUMIN 4.3 02/22/2015 0000   AST 27 12/01/2019 1347   AST 24 02/22/2015 0000   ALT 18 12/01/2019 1347   ALT 18 02/22/2015 0000   ALKPHOS 40 10/30/2019 1211   ALKPHOS 34 02/22/2015 0000   BILITOT 0.5 12/01/2019 1347   BILITOT 0.3 06/20/2019 1335   GFRNONAA 55 (L) 12/01/2019 1347   GFRAA 64 12/01/2019 1347    CBC    Component Value Date/Time   WBC 5.0 12/01/2019 1347   RBC 3.39 (L) 12/01/2019 1347   HGB 12.2 12/01/2019 1347   HGB 13.0 06/20/2019 1335   HCT 35.1 12/01/2019 1347   HCT 38.0 06/20/2019 1335   PLT 353 12/01/2019 1347   PLT 363 06/20/2019 1335   MCV 103.5 (H) 12/01/2019 1347   MCV 100 (H) 06/20/2019 1335   MCH 36.0 (H) 12/01/2019 1347   MCHC 34.8 12/01/2019 1347   RDW 13.3 12/01/2019 1347   RDW 13.4 06/20/2019 1335   LYMPHSABS 820 (L) 12/01/2019 1347   LYMPHSABS 0.6 (L) 06/20/2019 1335   MONOABS 0.4 10/30/2019 1211   EOSABS 10 (L) 12/01/2019  1347   EOSABS 0.0 06/20/2019 1335   BASOSABS 20 12/01/2019 1347   BASOSABS 0.0 06/20/2019 1335    Lab Results  Component Value Date   VD25OH 57 08/31/2017    T-score: DEXA on 01/11/2018 showed T sco-3.9 at radius with BMD of 0.457. Negative 7.58% change in BMD from previous DEXA.    Assessment/Plan:   Administrations This Visit    denosumab (PROLIA) injection 60 mg    Admin Date 12/08/2019 Action Given Dose 60 mg Route Subcutaneous Administered By Deboraha Sprang, RPH-CPP         Patient tolerated injection without issue.   Patient is to return in 10-14 days for labs to monitor for hypocalcemia.  She has an appointment on 12/28/19 and will obtain labs then.   All questions encouraged and answered.  Instructed patient to call with any further questions or concerns.   Mariella Saa, PharmD, [place], CPP Clinical Specialty Pharmacist (Rheumatology and Pulmonology)  12/08/2019 11:15 AM

## 2019-12-09 ENCOUNTER — Ambulatory Visit (INDEPENDENT_AMBULATORY_CARE_PROVIDER_SITE_OTHER): Payer: Medicare Other | Admitting: Family

## 2019-12-09 ENCOUNTER — Other Ambulatory Visit: Payer: Self-pay

## 2019-12-09 ENCOUNTER — Encounter: Payer: Self-pay | Admitting: Family

## 2019-12-09 ENCOUNTER — Ambulatory Visit (INDEPENDENT_AMBULATORY_CARE_PROVIDER_SITE_OTHER): Payer: Medicare Other

## 2019-12-09 VITALS — BP 116/78 | HR 65 | Temp 98.1°F | Ht 63.0 in | Wt 120.6 lb

## 2019-12-09 DIAGNOSIS — R600 Localized edema: Secondary | ICD-10-CM

## 2019-12-09 MED ORDER — FUROSEMIDE 20 MG PO TABS: 20.0000 mg | ORAL_TABLET | Freq: Every day | ORAL | 1 refills | Status: DC

## 2019-12-09 NOTE — Progress Notes (Signed)
Sherri [name] is a 79 y.o. female with the following history as recorded in EpicCare:  Patient Active Problem List   Diagnosis Date Noted  . Cellulitis of right upper extremity 10/26/2019  . Cervical radiculopathy 06/28/2019  . Carpal tunnel syndrome on both sides 06/28/2019  . Right sided sciatica 04/14/2018  . Trochanteric bursitis of right hip 04/14/2018  . S/P cervical spinal fusion 11/10/2017  . Swelling of left foot 10/26/2017  . Dysphagia 10/26/2017  . Left ankle swelling 10/26/2017  . Localized swelling, mass, and lump of head 08/18/2017  . Gastroesophageal reflux disease 08/06/2017  . Easy bruising 08/06/2017  . Anxiety 04/24/2017  . Mid back pain 03/26/2017  . Left sided chest pain 03/25/2017  . Dyspnea on exertion 03/14/2017  . Hyperkalemia 03/13/2017  . Submandibular gland swelling 01/08/2017  . High risk medication use 09/02/2016  . Urinary frequency 09/02/2016  . Piriformis muscle pain 09/02/2016  . Fatigue 07/22/2016  . Hair loss 07/22/2016  . Right hip pain 04/08/2016  . Right knee pain 02/25/2016  . Dyspepsia 04/05/2015  . Numbness 11/14/2014  . Biceps tendon tear 10/10/2014  . Insomnia 10/10/2014  . Gait disturbance 06/13/2014  . 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 OP (osteoporosis) 01/18/2014  . Other long term (current) drug therapy 01/18/2014  . Other dysphagia 12/29/2013  . Hoarseness 12/29/2013  . 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 Personal history of colonic polyps 07/03/2010  . Nocturia 05/28/2010  . THYROID NODULE 05/17/2008  . Multiple sclerosis ([place]) 05/17/2008  . Chronic fatigue 03/30/2007  . Hyperlipidemia 08/20/2006  . Essential hypertension 08/20/2006  . Rheumatoid arthritis ([place]) 08/20/2006  . CERVICAL CANCER, HX OF 08/20/2006  . GASTRIC ULCER, HX OF 08/20/2006    Current Outpatient Medications  Medication Sig Dispense Refill  . atorvastatin (LIPITOR) 10 MG tablet Take 1 tablet by mouth on Mondays and  Friday Annual appt is due w/labs must see provider for future refills 9 tablet 0  . Biotin 5000 MCG TABS Take 5,000 mcg by mouth daily.     . Calcium Carbonate-Vitamin D (CALCIUM + D PO) Take 1 tablet by mouth daily.     . Carboxymethylcellul-Glycerin (LUBRICATING EYE DROPS OP) Apply 1 drop to eye daily as needed (dry eyes).    . carvedilol (COREG) 6.25 MG tablet TAKE 1 TABLET (6.25 MG TOTAL) BY MOUTH 2 (TWO) TIMES DAILY WITH A MEAL. 180 tablet 1  . Cholecalciferol (VITAMIN D3) 2000 units capsule Take 2,000 Units by mouth daily.    . Cyanocobalamin (VITAMIN B-12 PO) Take 1 tablet by mouth daily.    [name] denosumab (PROLIA) 60 MG/ML SOSY injection INJECT 60 MG INTO THE SKIN EVERY 6 (SIX) MONTHS. 1 mL 0  . diclofenac sodium (VOLTAREN) 1 % GEL APPLY 3 GRAMS TO 3 LARGE JOINTS 3 TIMES A DAY AS NEEDED 100 g 0  . famotidine (PEPCID) 40 MG tablet TAKE 1 TABLET BY MOUTH EVERY DAY 90 tablet 1  . folic acid (FOLVITE) 1 MG tablet TAKE 2 TABLETS BY MOUTH EVERY DAY 180 tablet 3  . hydrOXYzine (ATARAX/VISTARIL) 10 MG tablet Take 1 tablet (10 mg total) by mouth every 8 (eight) hours as needed for itching. 10 tablet 0  . methotrexate (RHEUMATREX) 2.5 MG tablet TAKE 6 TABLETS BY MOUTH ONCE WEEKLY. CAUTION:CHEMOTHERAPY. PROTECT FROM LIGHT. 72 tablet 0  . OLUMIANT 2 MG TABS tablet TAKE ONE TABLET (2MG ) BY MOUTH DAILY 30 tablet 2  . omeprazole (PRILOSEC) 20 MG capsule Take 20 mg  by mouth every morning.    . Oxycodone HCl 10 MG TABS Take 1 tablet by mouth at bedtime as needed 30 tablet 0  . predniSONE (DELTASONE) 5 MG tablet TAKE 1 TABLET BY MOUTH EVERY DAY WITH BREAKFAST 30 tablet 0  . Pyridoxine HCl (VITAMIN B-6 PO) Take 1 tablet by mouth daily.    . traMADol (ULTRAM) 50 MG tablet TAKE 1 TABLET BY MOUTH THREE TIMES A DAY 90 tablet 5  . furosemide (LASIX) 20 MG tablet Take 1 tablet (20 mg total) by mouth daily. 90 tablet 1   No current facility-administered medications for this visit.    Allergies: Darvon  [propoxyphene hcl], Meperidine hcl, Penicillins, Azathioprine, Leflunomide, and Ezetimibe-simvastatin  Past Medical History:  Diagnosis Date  . Anxiety    takes Valium daily as needed  . Basal cell carcinoma 01/21/1988   left nostril (MOHS), sup-right calf (CX35FU)  . Basal cell carcinoma 03/22/1996   right calf (CX35FU)  . Basal cell carcinoma 03/31/1995   left wing nose  . Bruises easily    d/t meds  . Cancer ([place])    basal cell ca, in situ- uterine   . Cataracts, bilateral   . Chronic back pain   . Dizziness    r/t to meds  . GERD (gastroesophageal reflux disease)    no meds on a regular basis but will take Tums if needed  . Headache(784.0)    r/t neck issues  . History of bronchitis 6-42yrs ago  . History of colon polyps    benign  . Hyperlipidemia    takes Atorvastatin on Mondays and Fridays  . Hypertension    takes Ramipril daily  . Joint pain   . Joint swelling   . Multiple sclerosis ([place])   . Neuromuscular disorder ([place])    Dr. Park Liter- [facility], follows [facility]  . Nocturia   . OA (osteoarthritis)    Dr Jani Gravel weekly, RA- hands- knees- feet   . PONV (postoperative nausea and vomiting)    trouble urinating after surgery in 2014  . Rheumatoid arthritis(714.0)    Dr Estanislado Pandy takes [name] daily  . Right wrist fracture   . Skin cancer   . Squamous cell carcinoma of skin 11/02/2001   in situ-right knee (cx36fu)  . Squamous cell carcinoma of skin 11/12/2011   in situ-left forearm (CX35FU), in situ-left foot (CX35FU)  . Squamous cell carcinoma of skin 01/22/2012   in situ-left lower forearm (txpbx)  . Squamous cell carcinoma of skin 11/17/2012   right shin (txpbx)  . Squamous cell carcinoma of skin 10/28/2013   in situ-right shoulder (CX35FU)  . Squamous cell carcinoma of skin 04/28/2014   well diff-right shin (txpbx)  . Squamous cell carcinoma of skin 11/02/2014   in situ-Left shin (txpbx)  . Squamous cell carcinoma of skin 12/15/2014    in situ-Left hand (txpbx), bowens-left side chest (txpbx)  . Squamous cell carcinoma of skin 05/09/2015   in situ-left hand (txpbx)   . Squamous cell carcinoma of skin 05/07/2016   in situ-left inner shin,ant, in situ-left inner shin, post, in situ-left outer forearm, in situ-right knuckle  . Squamous cell carcinoma of skin 03/1302018   in situ-left shoulder (txpbx), in situ-right inner shin (txpbx), in situ-top of left foot (txpbx), KA- left forearm (txpbx)  . Squamous cell carcinoma of skin 12/02/2016   in situ-left inner shin (txpbx)  . Squamous cell carcinoma of skin 03/04/2017   in situ-left outer sup, shin (txpbx), in situ- right  2nd knuckle finger (txpbx), in situ- Left wrist (txpbx)  . Squamous cell carcinoma of skin 04/06/2018   in situ-above left knee inner (txpbx)  . Squamous cell carcinoma of skin 04/21/2018   in situ-right top hand (txpbx)  . Squamous cell carcinoma of skin 07/08/2018   in situ-right lower inner shin (txpbx)  . Squamous cell carcinoma of skin 04/27/2019   in situ- Left neck(CX35FU), In situ- right neck (CX35FU)  . Urinary retention    sees Dr.Wrenn about 2 times a yr    Past Surgical History:  Procedure Laterality Date  . ABDOMINAL HYSTERECTOMY     1985  . APPENDECTOMY     with TAH  . BACK SURGERY     several  . BREAST BIOPSY Right    Results were negative  . BROW LIFT Bilateral 10/02/2016   Procedure: BILATERAL LOWER LID BLEPHAROPLASTY;  Surgeon: Clista Bernhardt, MD;  Location: [facility];  Service: Plastics;  Laterality: Bilateral;  . CARPAL TUNNEL RELEASE Right   . cataracts    . CERVICAL FUSION     Dr Joya Salm  . CERVICAL FUSION  12/31/2012   Dr Joya Salm  . CHEST TUBE INSERTION     for traumatic Pneumothorax  . COLONOSCOPY  07/16/2010   normal   . COLONOSCOPY W/ POLYPECTOMY  1997   negative since; Dr Deatra Ina  . ESOPHAGOGASTRODUODENOSCOPY  07/16/2010   normal  . eye lid raise    . EYE SURGERY Bilateral    cataracts removed - /w IOL  . JOINT  REPLACEMENT    . LUMBAR FUSION     Dr Joya Salm  . NASAL SINUS SURGERY    . POSTERIOR CERVICAL FUSION/FORAMINOTOMY N/A 12/31/2012   Procedure: POSTERIOR LATERAL CERVICAL FUSION/FORAMINOTOMY LEVEL 1 CERVICAL THREE-FOUR WITH LATERAL MASS SCREWS;  Surgeon: Floyce Stakes, MD;  Location: [facility] [facility];  Service: Neurosurgery;  Laterality: N/A;  . PTOSIS REPAIR Bilateral 10/02/2016   Procedure: INTERNAL PTOSIS REPAIR;  Surgeon: Clista Bernhardt, MD;  Location: [place];  Service: Plastics;  Laterality: Bilateral;  . SKIN BIOPSY    . THYROID SURGERY     R lobe removed- 1972, has grown back - CT last done- 2018  . TOTAL HIP ARTHROPLASTY  12/22/2011   Procedure: TOTAL HIP ARTHROPLASTY;  Surgeon: Kerin Salen, MD;  Location: [place];  Service: Orthopedics;  Laterality: Left;  . TOTAL SHOULDER ARTHROPLASTY    . TUBAL LIGATION      Family History  Problem Relation Age of Onset  . Cancer Mother        pancreatic  . Diabetes Mother   . Heart disease Father        Rheumatic  . Cancer Father        ? stomach  . Cancer Sister        stomach  . Cancer Maternal Aunt        X 4; ? primary  . Heart disease Paternal Aunt   . Cancer Maternal Grandmother        cervical  . Colon cancer Other        Aunts  . Multiple sclerosis Daughter   . Hypertension Neg Hx   . Stroke Neg Hx     Social History   Tobacco Use  . Smoking status: Former Smoker    Packs/day: 2.50    Years: 20.00    Pack years: 50.00    Types: Cigarettes    Quit date: 05/26/1981    Years since quitting:  38.5  . Smokeless tobacco: Never Used  . Tobacco comment: Smoked [phone], up to 2.5 ppd  Substance Use Topics  . Alcohol use: Yes    Alcohol/week: 14.0 standard drinks    Types: 14 Cans of beer per week    Comment: 2% beer    Subjective:  Patient is concerned about sensation of fluid retention- has used Lasix in the past but patient opted to stop this in the past month; she did stop the medication without discussing with her PCP;  now has gained 5 pounds and wants to discuss re-starting the medication;      Objective:  Vitals:   12/09/19 1345  BP: 116/78  Pulse: 65  Temp: 98.1 F (36.7 C)  TempSrc: Oral  SpO2: 96%  Weight: 120 lb 9.6 oz (54.7 kg)  Height: 5\' 3"  (1.6 m)    General: Well developed, well nourished, in no acute distress  Skin : Warm and dry.  Head: Normocephalic and atraumatic  Lungs: Respirations unlabored; clear to auscultation bilaterally without wheeze, rales, rhonchi  CVS exam: normal rate and regular rhythm.  Extremities: No edema, cyanosis, clubbing  Vessels: Symmetric bilaterally  Neurologic: Alert and oriented; speech intact; face symmetrical; moves all extremities well; CNII-XII intact without focal deficit   Assessment:  1. Pedal edema     Plan:  Chronic issue with exacerbation- will update BNP and CXR today; will re-start the Lasix that patient has done well  on in the past ( has been on this medication for at least 20+ years); discussed risks/ benefits of using this medication with her MS and she is in agreement that the benefit outweighs the risk; may need to refer to cardiology if symptoms persist.   This visit occurred during the SARS-CoV-2 public health emergency.  Safety protocols were in place, including screening questions prior to the visit, additional usage of staff PPE, and extensive cleaning of exam room while observing appropriate contact time as indicated for disinfecting solutions.     No follow-ups on file.  Orders Placed This Encounter  Procedures  . DG Chest 2 View    Standing Status:   Future    Number of Occurrences:   1    Standing Expiration Date:   12/08/2020    Order Specific Question:   Reason for Exam (SYMPTOM  OR DIAGNOSIS REQUIRED)    Answer:   pedal edema    Order Specific Question:   Preferred imaging location?    Answer:   [facility]    Order Specific Question:   Radiology Contrast Protocol - do NOT remove file path    Answer:    [email or web address]  . B Nat Peptide    Standing Status:   Future    Number of Occurrences:   1    Standing Expiration Date:   12/08/2020    Requested Prescriptions   Signed Prescriptions Disp Refills  . furosemide (LASIX) 20 MG tablet 90 tablet 1    Sig: Take 1 tablet (20 mg total) by mouth daily.

## 2019-12-10 LAB — BRAIN NATRIURETIC PEPTIDE: Brain Natriuretic Peptide: 137 pg/mL — ABNORMAL HIGH

## 2019-12-14 ENCOUNTER — Encounter: Payer: Medicare Other | Admitting: Physical Therapy

## 2019-12-14 NOTE — Progress Notes (Signed)
Office Visit Note  Patient: Sherri [name]             Date of Birth: 03-14-1941           MRN: [id number]             PCP: Binnie Rail, MD Referring: Binnie Rail, MD Visit Date: 12/28/2019 Occupation: @GUAROCC @  Subjective:  Other (increased joint pain, patient would like to discuss medication options )   History of Present Illness: Sherri [name] is a 79 y.o. female with history of rheumatoid arthritis and osteoarthritis overlap.  Patient states she developed a flare about a week ago and increase her prednisone to 10 mg p.o. daily.  Prior to that she was having pain in multiple joints and some swelling in her hands.  She has noticed improvement with prednisone 10 mg p.o. daily.  She is on Olumiant and methotrexate combination.  She believes the Olumiant is not as effective anymore.  Activities of Daily Living:  Patient reports morning stiffness for 5-6  hours.   Patient Reports nocturnal pain.  Difficulty dressing/grooming: Reports Difficulty climbing stairs: Denies Difficulty getting out of chair: Denies Difficulty using hands for taps, buttons, cutlery, and/or writing: Reports  Review of Systems  Constitutional: Positive for fatigue.  HENT: Positive for mouth dryness. Negative for mouth sores and nose dryness.   Eyes: Positive for dryness.  Respiratory: Negative for shortness of breath and difficulty breathing.   Cardiovascular: Negative for chest pain and palpitations.  Gastrointestinal: Negative for blood in stool, constipation and diarrhea.  Endocrine: Negative for increased urination.  Genitourinary: Negative for difficulty urinating.  Musculoskeletal: Positive for arthralgias, joint pain, joint swelling and morning stiffness.  Skin: Negative for color change, rash and redness.  Allergic/Immunologic: Negative for susceptible to infections.  Neurological: Positive for weakness. Negative for dizziness, numbness, headaches and memory loss.  Hematological:  Positive for bruising/bleeding tendency.  Psychiatric/Behavioral: Negative for confusion.    PMFS History:  Patient Active Problem List   Diagnosis Date Noted  . Cellulitis of right upper extremity 10/26/2019  . Cervical radiculopathy 06/28/2019  . Carpal tunnel syndrome on both sides 06/28/2019  . Right sided sciatica 04/14/2018  . Trochanteric bursitis of right hip 04/14/2018  . S/P cervical spinal fusion 11/10/2017  . Swelling of left foot 10/26/2017  . Dysphagia 10/26/2017  . Left ankle swelling 10/26/2017  . Localized swelling, mass, and lump of head 08/18/2017  . Gastroesophageal reflux disease 08/06/2017  . Easy bruising 08/06/2017  . Anxiety 04/24/2017  . Mid back pain 03/26/2017  . Left sided chest pain 03/25/2017  . Dyspnea on exertion 03/14/2017  . Hyperkalemia 03/13/2017  . Submandibular gland swelling 01/08/2017  . High risk medication use 09/02/2016  . Urinary frequency 09/02/2016  . Piriformis muscle pain 09/02/2016  . Fatigue 07/22/2016  . Hair loss 07/22/2016  . Right hip pain 04/08/2016  . Right knee pain 02/25/2016  . Dyspepsia 04/05/2015  . Numbness 11/14/2014  . Biceps tendon tear 10/10/2014  . Insomnia 10/10/2014  . Gait disturbance 06/13/2014  . 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 OP (osteoporosis) 01/18/2014  . Other long term (current) drug therapy 01/18/2014  . Other dysphagia 12/29/2013  . Hoarseness 12/29/2013  . 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 Personal history of colonic polyps 07/03/2010  . Nocturia 05/28/2010  . THYROID NODULE 05/17/2008  . Multiple sclerosis ([place]) 05/17/2008  . Chronic fatigue 03/30/2007  . Hyperlipidemia 08/20/2006  . Essential hypertension 08/20/2006  .  Rheumatoid arthritis ([place]) 08/20/2006  . CERVICAL CANCER, HX OF 08/20/2006  . GASTRIC ULCER, HX OF 08/20/2006    Past Medical History:  Diagnosis Date  . Anxiety    takes Valium daily as needed  . Basal cell carcinoma 01/21/1988    left nostril (MOHS), sup-right calf (CX35FU)  . Basal cell carcinoma 03/22/1996   right calf (CX35FU)  . Basal cell carcinoma 03/31/1995   left wing nose  . Bruises easily    d/t meds  . Cancer ([place])    basal cell ca, in situ- uterine   . Cataracts, bilateral   . Chronic back pain   . Dizziness    r/t to meds  . GERD (gastroesophageal reflux disease)    no meds on a regular basis but will take Tums if needed  . Headache(784.0)    r/t neck issues  . History of bronchitis 6-35yrs ago  . History of colon polyps    benign  . Hyperlipidemia    takes Atorvastatin on Mondays and Fridays  . Hypertension    takes Ramipril daily  . Joint pain   . Joint swelling   . Multiple sclerosis ([place])   . Neuromuscular disorder ([place])    Dr. Park Liter- [facility], follows M.S.  . Nocturia   . OA (osteoarthritis)    Dr Jani Gravel weekly, RA- hands- knees- feet   . PONV (postoperative nausea and vomiting)    trouble urinating after surgery in 2014  . Rheumatoid arthritis(714.0)    Dr Estanislado Pandy takes [name] daily  . Right wrist fracture   . Skin cancer   . Squamous cell carcinoma of skin 11/02/2001   in situ-right knee (cx21fu)  . Squamous cell carcinoma of skin 11/12/2011   in situ-left forearm (CX35FU), in situ-left foot (CX35FU)  . Squamous cell carcinoma of skin 01/22/2012   in situ-left lower forearm (txpbx)  . Squamous cell carcinoma of skin 11/17/2012   right shin (txpbx)  . Squamous cell carcinoma of skin 10/28/2013   in situ-right shoulder (CX35FU)  . Squamous cell carcinoma of skin 04/28/2014   well diff-right shin (txpbx)  . Squamous cell carcinoma of skin 11/02/2014   in situ-Left shin (txpbx)  . Squamous cell carcinoma of skin 12/15/2014   in situ-Left hand (txpbx), bowens-left side chest (txpbx)  . Squamous cell carcinoma of skin 05/09/2015   in situ-left hand (txpbx)   . Squamous cell carcinoma of skin 05/07/2016   in situ-left inner shin,ant, in situ-left  inner shin, post, in situ-left outer forearm, in situ-right knuckle  . Squamous cell carcinoma of skin 03/1302018   in situ-left shoulder (txpbx), in situ-right inner shin (txpbx), in situ-top of left foot (txpbx), KA- left forearm (txpbx)  . Squamous cell carcinoma of skin 12/02/2016   in situ-left inner shin (txpbx)  . Squamous cell carcinoma of skin 03/04/2017   in situ-left outer sup, shin (txpbx), in situ- right 2nd knuckle finger (txpbx), in situ- Left wrist (txpbx)  . Squamous cell carcinoma of skin 04/06/2018   in situ-above left knee inner (txpbx)  . Squamous cell carcinoma of skin 04/21/2018   in situ-right top hand (txpbx)  . Squamous cell carcinoma of skin 07/08/2018   in situ-right lower inner shin (txpbx)  . Squamous cell carcinoma of skin 04/27/2019   in situ- Left neck(CX35FU), In situ- right neck (CX35FU)  . Urinary retention    sees Dr.Wrenn about 2 times a yr    Family History  Problem Relation Age of Onset  .  Cancer Mother        pancreatic  . Diabetes Mother   . Heart disease Father        Rheumatic  . Cancer Father        ? stomach  . Cancer Sister        stomach  . Cancer Maternal Aunt        X 4; ? primary  . Heart disease Paternal Aunt   . Cancer Maternal Grandmother        cervical  . Colon cancer Other        Aunts  . Multiple sclerosis Daughter   . Hypertension Neg Hx   . Stroke Neg Hx    Past Surgical History:  Procedure Laterality Date  . ABDOMINAL HYSTERECTOMY     1985  . APPENDECTOMY     with TAH  . BACK SURGERY     several  . BREAST BIOPSY Right    Results were negative  . BROW LIFT Bilateral 10/02/2016   Procedure: BILATERAL LOWER LID BLEPHAROPLASTY;  Surgeon: Clista Bernhardt, MD;  Location: [facility];  Service: Plastics;  Laterality: Bilateral;  . CARPAL TUNNEL RELEASE Right   . cataracts    . CERVICAL FUSION     Dr Joya Salm  . CERVICAL FUSION  12/31/2012   Dr Joya Salm  . CHEST TUBE INSERTION     for traumatic Pneumothorax  .  COLONOSCOPY  07/16/2010   normal   . COLONOSCOPY W/ POLYPECTOMY  1997   negative since; Dr Deatra Ina  . ESOPHAGOGASTRODUODENOSCOPY  07/16/2010   normal  . eye lid raise    . EYE SURGERY Bilateral    cataracts removed - /w IOL  . JOINT REPLACEMENT    . LUMBAR FUSION     Dr Joya Salm  . NASAL SINUS SURGERY    . POSTERIOR CERVICAL FUSION/FORAMINOTOMY N/A 12/31/2012   Procedure: POSTERIOR LATERAL CERVICAL FUSION/FORAMINOTOMY LEVEL 1 CERVICAL THREE-FOUR WITH LATERAL MASS SCREWS;  Surgeon: Floyce Stakes, MD;  Location: [facility];  Service: Neurosurgery;  Laterality: N/A;  . PTOSIS REPAIR Bilateral 10/02/2016   Procedure: INTERNAL PTOSIS REPAIR;  Surgeon: Clista Bernhardt, MD;  Location: [place];  Service: Plastics;  Laterality: Bilateral;  . SKIN BIOPSY    . THYROID SURGERY     R lobe removed- 1972, has grown back - CT last done- 2018  . TOTAL HIP ARTHROPLASTY  12/22/2011   Procedure: TOTAL HIP ARTHROPLASTY;  Surgeon: Kerin Salen, MD;  Location: [place];  Service: Orthopedics;  Laterality: Left;  . TOTAL SHOULDER ARTHROPLASTY    . TUBAL LIGATION     Social History   Social History Narrative  . Not on file   Immunization History  Administered Date(s) Administered  . Influenza Whole 02/22/2007, 02/08/2013  . Influenza, High Dose Seasonal PF 02/04/2015, 02/18/2017, 02/21/2018, 01/16/2019  . Influenza-Unspecified 01/24/2014, 01/25/2016, 02/18/2017, 01/16/2019  . PFIZER SARS-COV-2 Vaccination 06/18/2019, 07/09/2019  . Pneumococcal Conjugate-13 11/23/2012, 09/08/2014  . Pneumococcal Polysaccharide-23 05/26/2006  . Pneumococcal-Unspecified 12/28/2012  . Td 05/28/2010  . Zoster 05/26/2009  . Zoster Recombinat (Shingrix) 02/10/2019, 04/14/2019     Objective: Vital Signs: BP 127/78 (BP Location: Left Arm, Patient Position: Sitting, Cuff Size: Normal)   Pulse 63   Resp 14   Ht 5\' 4"  (1.626 m)   Wt 118 lb 3.2 oz (53.6 kg)   BMI 20.29 kg/m    Physical Exam Vitals and nursing note  reviewed.  Constitutional:      Appearance: She  is well-developed.  HENT:     Head: Normocephalic and atraumatic.  Eyes:     Conjunctiva/sclera: Conjunctivae normal.  Cardiovascular:     Rate and Rhythm: Normal rate and regular rhythm.     Heart sounds: Normal heart sounds.  Pulmonary:     Effort: Pulmonary effort is normal.     Breath sounds: Normal breath sounds.  Abdominal:     General: Bowel sounds are normal.     Palpations: Abdomen is soft.  Musculoskeletal:     Cervical back: Normal range of motion.  Lymphadenopathy:     Cervical: No cervical adenopathy.  Skin:    General: Skin is warm and dry.     Capillary Refill: Capillary refill takes less than 2 seconds.  Neurological:     Mental Status: She is alert and oriented to person, place, and time.  Psychiatric:        Behavior: Behavior normal.      Musculoskeletal Exam: C-spine was in good range of motion.  Shoulder joints, elbow joints, wrist joints, MCPs PIPs and DIPs with good range of motion.  She has synovial thickening over bilateral MCPs, PIPs and DIPs.  Hip joints, knee joints, ankles with good range of motion.  She had no tenderness across MTPs.  CDAI Exam: CDAI Score: 1  Patient Global: 5 mm; Provider Global: 5 mm Swollen: 0 ; Tender: 0  Joint Exam 12/28/2019   No joint exam has been documented for this visit   There is currently no information documented on the homunculus. Go to the Rheumatology activity and complete the homunculus joint exam.  Investigation: No additional findings.  Imaging: DG Chest 2 View  Result Date: 12/12/2019 CLINICAL DATA:  Pedal edema. EXAM: CHEST - 2 VIEW COMPARISON:  March 25, 2017. FINDINGS: The heart size and mediastinal contours are within normal limits. Both lungs are clear. No pneumothorax or pleural effusion is noted. The visualized skeletal structures are unremarkable. IMPRESSION: No active cardiopulmonary disease. Aortic Atherosclerosis (ICD10-I70.0).  Electronically Signed   By: Marijo Conception M.D.   On: 12/12/2019 11:37   XR Hand 2 View Left  Result Date: 12/28/2019 Juxta-articular osteopenia was noted.  Narrowing of all MCP joints most prominent in the first second and third MCPs was noted.  PIP and DIP narrowing was noted.  Severe CMC narrowing was noted.  Intercarpal and radiocarpal joint space narrowing was noted.  No radiographic progression was noted when compared to the x-rays of 2018. Impression: These findings are consistent with rheumatoid arthritis and osteoarthritis overlap.  XR Hand 2 View Right  Result Date: 12/28/2019 Juxta-articular osteopenia was noted.  Invagination of second and third MCP joints was noted.  Severe narrowing and subluxation of first MCP joint was noted.  PIP and DIP narrowing was noted.  Intercarpal and radiocarpal joint space narrowing was noted.  No radiographic progression was noted when compared to the x-rays of 2018. Impression: These findings are consistent with severe rheumatoid arthritis and osteoarthritis overlap.   Recent Labs: Lab Results  Component Value Date   WBC 5.0 12/01/2019   HGB 12.2 12/01/2019   PLT 353 12/01/2019   NA 136 12/01/2019   K 4.8 12/01/2019   CL 101 12/01/2019   CO2 29 12/01/2019   GLUCOSE 96 12/01/2019   BUN 14 12/01/2019   CREATININE 0.99 (H) 12/01/2019   BILITOT 0.5 12/01/2019   ALKPHOS 40 10/30/2019   AST 27 12/01/2019   ALT 18 12/01/2019   PROT 6.3 12/01/2019   ALBUMIN  3.5 10/30/2019   CALCIUM 9.4 12/01/2019   GFRAA 64 12/01/2019   QFTBGOLDPLUS NEGATIVE 10/04/2019    Speciality Comments: Prior therapy includes: [name] (cost), [name] (cost)   Procedures:  No procedures performed Allergies: Darvon [propoxyphene hcl], Meperidine hcl, Penicillins, Azathioprine, Leflunomide, and Ezetimibe-simvastatin   Assessment / Plan:     Visit Diagnoses: Rheumatoid arthritis involving multiple sites with positive rheumatoid factor ([place]) -patient had a recent flare  with increased pain and discomfort in her bilateral hands.  Her symptoms have improved by increasing prednisone from 5 mg to 10 mg p.o. daily.  X-rays obtained today did not show any radiographic progression when compared to the x-rays of 2018.  Plan: XR Hand 2 View Right, XR Hand 2 View Left, Sedimentation rate.  Patient states she is not having good response to Olumiant anymore.  She wants to try [name].  We had applied for [name] in the past but her co-pay was quite high.  We will apply again per her request.  I advised to taper prednisone gradually to 5 mg as tolerated.  High risk medication use - Olumiant 2mg , MTX 6 tablets p.o. weekly, folic acid 2 mg p.o. daily, prednisone 5 mg p.o. daily.  - Plan: CBC with Differential/Platelet, COMPLETE METABOLIC PANEL WITH GFR today and then every 3 months to monitor for drug toxicity.  Bilateral hand pain - Plan: XR Hand 2 View Right, XR Hand 2 View Left  Trochanteric bursitis of right hip -she continues to have some discomfort.  Primary osteoarthritis of right hip-she is some limitation of range of motion.  History of total hip replacement, left - Dr. Mayer Camel  Cellulitis of right upper extremity - Dr. Junius Roads  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-she has chronic pain but currently manageable.  DDD (degenerative disc disease), lumbar-she is chronic pain.  Age-related osteoporosis without current pathological fracture -she is on Prolia.  DEXA January 11, 2018 showed a T score of -3.9.  She needs a repeat DEXA.  Closed fracture of right wrist with routine healing, subsequent encounter  Other fatigue  History of multiple sclerosis ([place])  History of hyperlipidemia  Primary insomnia  History of depression  History of anxiety  History of hypertension  Orders: Orders Placed This Encounter  Procedures  . XR Hand 2 View Right  . XR Hand 2 View Left  . DG BONE DENSITY (DXA)  . CBC with Differential/Platelet  . COMPLETE METABOLIC PANEL WITH  GFR  . Sedimentation rate   No orders of the defined types were placed in this encounter.    Follow-Up Instructions: Return in about 2 months (around 02/27/2020) for Rheumatoid arthritis, Osteoarthritis.   Bo Merino, MD  Note - This record has been created using Editor, commissioning.  Chart creation errors have been sought, but may not always  have been located. Such creation errors do not reflect on  the standard of medical care.

## 2019-12-17 ENCOUNTER — Other Ambulatory Visit: Payer: Self-pay | Admitting: Internal Medicine

## 2019-12-21 ENCOUNTER — Ambulatory Visit: Payer: Medicare Other | Admitting: Physical Therapy

## 2019-12-21 ENCOUNTER — Other Ambulatory Visit: Payer: Self-pay

## 2019-12-21 ENCOUNTER — Other Ambulatory Visit: Payer: Self-pay | Admitting: Rheumatology

## 2019-12-21 ENCOUNTER — Other Ambulatory Visit: Payer: Self-pay | Admitting: Family Medicine

## 2019-12-21 ENCOUNTER — Encounter: Payer: Self-pay | Admitting: Physical Therapy

## 2019-12-21 DIAGNOSIS — M79602 Pain in left arm: Secondary | ICD-10-CM

## 2019-12-21 DIAGNOSIS — M25522 Pain in left elbow: Secondary | ICD-10-CM

## 2019-12-21 MED ORDER — OXYCODONE HCL 10 MG PO TABS: ORAL_TABLET | ORAL | 0 refills | Status: DC

## 2019-12-21 NOTE — Telephone Encounter (Signed)
Pt is requesting a refill for Oxycodone HCl 10 MG TABS.  Pharmacy: [id number]

## 2019-12-21 NOTE — Telephone Encounter (Signed)
Called the pharmacy for the patient due to [facility] registry being down. Pt last picked up her prescription on 11/24/2019.  I will send a request to Dr Felecia Shelling for him to refill for the patient since she will be due soon. I have corrected the script to allow the pharmacy to refill on 12/23/19 or after. She was last seen in office on 09/26/2019

## 2019-12-21 NOTE — Therapy (Addendum)
[facility] [address] [place], [place], [address] Phone: [phone]   Fax:  [phone]  Physical Therapy Treatment  Patient Details  Name: Sherri [name] MRN: [id number] Date of Birth: 01-31-1941 Referring Provider (PT): Eunice Blase   Encounter Date: 12/21/2019   PT End of Session - 12/21/19 1526    Visit Number 3    Number of Visits 12    Date for PT Re-Evaluation 01/12/20    Authorization Type U[employer]    PT Start Time 1300    PT Stop Time 1340    PT Time Calculation (min) 40 min    Activity Tolerance Patient tolerated treatment well    Behavior During Therapy [facility] for tasks assessed/performed           Past Medical History:  Diagnosis Date  . Anxiety    takes Valium daily as needed  . Basal cell carcinoma 01/21/1988   left nostril (MOHS), sup-right calf (CX35FU)  . Basal cell carcinoma 03/22/1996   right calf (CX35FU)  . Basal cell carcinoma 03/31/1995   left wing nose  . Bruises easily    d/t meds  . Cancer ([place])    basal cell ca, in situ- uterine   . Cataracts, bilateral   . Chronic back pain   . Dizziness    r/t to meds  . GERD (gastroesophageal reflux disease)    no meds on a regular basis but will take Tums if needed  . Headache(784.0)    r/t neck issues  . History of bronchitis 6-20yr ago  . History of colon polyps    benign  . Hyperlipidemia    takes Atorvastatin on Mondays and Fridays  . Hypertension    takes Ramipril daily  . Joint pain   . Joint swelling   . Multiple sclerosis ([place]   . Neuromuscular disorder ([place]    Dr. RPark Liter [facility], follows [facility]  . Nocturia   . OA (osteoarthritis)    Dr VJani Gravelweekly, RA- hands- knees- feet   . PONV (postoperative nausea and vomiting)    trouble urinating after surgery in 2014  . Rheumatoid arthritis(714.0)    Dr DEstanislado Pandytakes XMorrie Sheldondaily  . Right wrist fracture   . Skin cancer   . Squamous cell carcinoma of skin  11/02/2001   in situ-right knee (cx34f  . Squamous cell carcinoma of skin 11/12/2011   in situ-left forearm (CX35FU), in situ-left foot (CX35FU)  . Squamous cell carcinoma of skin 01/22/2012   in situ-left lower forearm (txpbx)  . Squamous cell carcinoma of skin 11/17/2012   right shin (txpbx)  . Squamous cell carcinoma of skin 10/28/2013   in situ-right shoulder (CX35FU)  . Squamous cell carcinoma of skin 04/28/2014   well diff-right shin (txpbx)  . Squamous cell carcinoma of skin 11/02/2014   in situ-Left shin (txpbx)  . Squamous cell carcinoma of skin 12/15/2014   in situ-Left hand (txpbx), bowens-left side chest (txpbx)  . Squamous cell carcinoma of skin 05/09/2015   in situ-left hand (txpbx)   . Squamous cell carcinoma of skin 05/07/2016   in situ-left inner shin,ant, in situ-left inner shin, post, in situ-left outer forearm, in situ-right knuckle  . Squamous cell carcinoma of skin 03/1302018   in situ-left shoulder (txpbx), in situ-right inner shin (txpbx), in situ-top of left foot (txpbx), KA- left forearm (txpbx)  . Squamous cell carcinoma of skin 12/02/2016   in situ-left inner shin (txpbx)  . Squamous cell carcinoma  of skin 03/04/2017   in situ-left outer sup, shin (txpbx), in situ- right 2nd knuckle finger (txpbx), in situ- Left wrist (txpbx)  . Squamous cell carcinoma of skin 04/06/2018   in situ-above left knee inner (txpbx)  . Squamous cell carcinoma of skin 04/21/2018   in situ-right top hand (txpbx)  . Squamous cell carcinoma of skin 07/08/2018   in situ-right lower inner shin (txpbx)  . Squamous cell carcinoma of skin 04/27/2019   in situ- Left neck(CX35FU), In situ- right neck (CX35FU)  . Urinary retention    sees Dr.Wrenn about 2 times a yr    Past Surgical History:  Procedure Laterality Date  . ABDOMINAL HYSTERECTOMY     1985  . APPENDECTOMY     with TAH  . BACK SURGERY     several  . BREAST BIOPSY Right    Results were negative  . BROW LIFT  Bilateral 10/02/2016   Procedure: BILATERAL LOWER LID BLEPHAROPLASTY;  Surgeon: Clista Bernhardt, MD;  Location: [facility];  Service: Plastics;  Laterality: Bilateral;  . CARPAL TUNNEL RELEASE Right   . cataracts    . CERVICAL FUSION     Dr Joya Salm  . CERVICAL FUSION  12/31/2012   Dr Joya Salm  . CHEST TUBE INSERTION     for traumatic Pneumothorax  . COLONOSCOPY  07/16/2010   normal   . COLONOSCOPY W/ POLYPECTOMY  1997   negative since; Dr Deatra Ina  . ESOPHAGOGASTRODUODENOSCOPY  07/16/2010   normal  . eye lid raise    . EYE SURGERY Bilateral    cataracts removed - /w IOL  . JOINT REPLACEMENT    . LUMBAR FUSION     Dr Joya Salm  . NASAL SINUS SURGERY    . POSTERIOR CERVICAL FUSION/FORAMINOTOMY N/A 12/31/2012   Procedure: POSTERIOR LATERAL CERVICAL FUSION/FORAMINOTOMY LEVEL 1 CERVICAL THREE-FOUR WITH LATERAL MASS SCREWS;  Surgeon: Floyce Stakes, MD;  Location: [facility];  Service: Neurosurgery;  Laterality: N/A;  . PTOSIS REPAIR Bilateral 10/02/2016   Procedure: INTERNAL PTOSIS REPAIR;  Surgeon: Clista Bernhardt, MD;  Location: [facility];  Service: Plastics;  Laterality: Bilateral;  . SKIN BIOPSY    . THYROID SURGERY     R lobe removed- 1972, has grown back - CT last done- 2018  . TOTAL HIP ARTHROPLASTY  12/22/2011   Procedure: TOTAL HIP ARTHROPLASTY;  Surgeon: Kerin Salen, MD;  Location: [facility];  Service: Orthopedics;  Laterality: Left;  . TOTAL SHOULDER ARTHROPLASTY    . TUBAL LIGATION      There were no vitals filed for this visit.   Subjective Assessment - 12/21/19 1525    Subjective Pt states continued pain in elbow. Does not hurt with activity, use, but hurts significantly to touch    Patient Stated Goals Decreased pain    Currently in Pain? Yes    Pain Score 5     Pain Location Arm    Pain Orientation Left    Pain Descriptors / Indicators Aching;Burning    Pain Type Chronic pain    Pain Onset More than a month ago    Pain Frequency Intermittent                              [facility] Adult PT Treatment/Exercise - 12/21/19 0001      Wrist Exercises   Wrist Extension 15 reps    Bar Weights/Barbell (Wrist Extension) 2 lbs    Wrist Extension  Limitations slow eccentric lowering     Other wrist exercises  Rows RTB x 20;     Other wrist exercises Ulnar nerve glide, seated x10;  Medial nerve glide, supine pec stretch w wrist flex/ext x 20      Modalities   Modalities Ultrasound      Ultrasound   Ultrasound Location L lateral elbow    Ultrasound Parameters 50 %     Ultrasound Goals Pain      Iontophoresis   Type of Iontophoresis Dexamethasone    Location L lateral elbow    Time 6 hr patch      Manual Therapy   Manual therapy comments --    Soft tissue mobilization Light STM and TFM to L wrist extensors,                     PT Short Term Goals - 12/21/19 1526      PT SHORT TERM GOAL #1   Title Pt to be independent with initial HEP    Time 2    Period Weeks    Status Achieved    Target Date 12/15/19      PT SHORT TERM GOAL #2   Title Pt to report decreased pain in L elbow to 0-3/10 with activity    Time 3    Period Weeks    Status Partially Met    Target Date 12/15/19             PT Long Term Goals - 12/04/19 1327      PT LONG TERM GOAL #1   Title Pt to be independent with final HEP    Time 6    Period Weeks    Status New    Target Date 01/12/20      PT LONG TERM GOAL #2   Title Pt to report decreased pain in L elbow to 0-1/10 with activity    Time 6    Period Weeks    Status New    Target Date 01/12/20      PT LONG TERM GOAL #3   Title Pt to demo ability for lifting, carrying, and grasping without pain or deficit, to improve ability for IADLs    Time 6    Period Weeks    Status New    Target Date 01/12/20                 Plan - 12/21/19 1527    Clinical Impression Statement Ionto added for pain today, will assess effects next visit. Pt educated on need to remove at any  time, if skin irritation occurs, pt states understanding, and is agreeable to trying ionto for pain relief. Pt continues to have significant soreness with palpation and light touch. Recommended light compression sleeve for forearm for pain. Will refer pt back to MD if no improvements next week.    Personal Factors and Comorbidities Comorbidity 1;Comorbidity 2    Comorbidities RA, OA, OsteoP, CA, +neck MRI, Sensitive skin on forearms.    Examination-Activity Limitations Reach Overhead;Carry;Hygiene/Grooming;Lift    Examination-Participation Restrictions Yard Work;Cleaning;Meal Prep;Community Activity;Driving;Shop;Laundry    Stability/Clinical Decision Making Evolving/Moderate complexity    Rehab Potential Good    PT Frequency 2x / week    PT Duration 6 weeks    PT Treatment/Interventions ADLs/Self Care Home Management;Cryotherapy;Electrical Stimulation;Ultrasound;Traction;Moist Heat;Iontophoresis 38m/ml Dexamethasone;Functional mobility training;Therapeutic activities;Therapeutic exercise;Neuromuscular re-education;Patient/family education;Manual techniques;Passive range of motion;Dry needling;Splinting;Taping;Joint Manipulations;Spinal Manipulations    PT Next Visit Plan May benefit from  Phono/Dex with [place] for pain /  may not tolerate ionto patch adhesive    PT Home Exercise Plan [id number]    Consulted and Agree with Plan of Care Patient           Patient will benefit from skilled therapeutic intervention in order to improve the following deficits and impairments:  Pain, Increased muscle spasms, Decreased range of motion, Decreased strength, Impaired UE functional use, Decreased safety awareness, Impaired flexibility, Decreased activity tolerance  Visit Diagnosis: Pain in left elbow  Pain in left arm     Problem List Patient Active Problem List   Diagnosis Date Noted  . Cellulitis of right upper extremity 10/26/2019  . Cervical radiculopathy 06/28/2019  . Carpal tunnel syndrome on both  sides 06/28/2019  . Right sided sciatica 04/14/2018  . Trochanteric bursitis of right hip 04/14/2018  . S/P cervical spinal fusion 11/10/2017  . Swelling of left foot 10/26/2017  . Dysphagia 10/26/2017  . Left ankle swelling 10/26/2017  . Localized swelling, mass, and lump of head 08/18/2017  . Gastroesophageal reflux disease 08/06/2017  . Easy bruising 08/06/2017  . Anxiety 04/24/2017  . Mid back pain 03/26/2017  . Left sided chest pain 03/25/2017  . Dyspnea on exertion 03/14/2017  . Hyperkalemia 03/13/2017  . Submandibular gland swelling 01/08/2017  . High risk medication use 09/02/2016  . Urinary frequency 09/02/2016  . Piriformis muscle pain 09/02/2016  . Fatigue 07/22/2016  . Hair loss 07/22/2016  . Right hip pain 04/08/2016  . Right knee pain 02/25/2016  . Dyspepsia 04/05/2015  . Numbness 11/14/2014  . Biceps tendon tear 10/10/2014  . Insomnia 10/10/2014  . Gait disturbance 06/13/2014  . 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 OP (osteoporosis) 01/18/2014  . Other long term (current) drug therapy 01/18/2014  . Other dysphagia 12/29/2013  . Hoarseness 12/29/2013  . 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 Personal history of colonic polyps 07/03/2010  . Nocturia 05/28/2010  . THYROID NODULE 05/17/2008  . Multiple sclerosis ([place]) 05/17/2008  . Chronic fatigue 03/30/2007  . Hyperlipidemia 08/20/2006  . Essential hypertension 08/20/2006  . Rheumatoid arthritis ([place]) 08/20/2006  . CERVICAL CANCER, HX OF 08/20/2006  . GASTRIC ULCER, HX OF 08/20/2006    Lyndee Hensen, PT, DPT 3:29 PM  12/21/19      PHYSICAL THERAPY DISCHARGE SUMMARY  Visits from Start of Care: 3 Plan: Patient agrees to discharge.  Patient goals were partially met. Patient is being discharged due to not returning since the last visit.  ?????     Lyndee Hensen, PT, DPT 9:48 AM  09/18/20     [facility] [place],  [place], [address] Phone: [phone]   Fax:  [phone]  Name: Sherri [name] MRN: [id number] Date of Birth: Oct 09, 1940

## 2019-12-21 NOTE — Telephone Encounter (Signed)
Last Visit: 10/28/2019 Next Visit: 12/28/2019  Current Dose per office note on 10/28/2019:prednisone 5 mg p.o. daily.  Okay to refill per Dr. Estanislado Pandy

## 2019-12-21 NOTE — Addendum Note (Signed)
Addended by: Darleen Crocker on: 12/21/2019 09:53 AM   Modules accepted: Orders

## 2019-12-26 ENCOUNTER — Encounter: Payer: Medicare Other | Admitting: Physical Therapy

## 2019-12-26 ENCOUNTER — Telehealth: Payer: Self-pay | Admitting: Rheumatology

## 2019-12-26 NOTE — Telephone Encounter (Signed)
Patient states her pain 2 days ago. Patient states she is having pain and swelling in her hands wrist and shoulders, ankles, legs and feet. 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 Patient states she does not feel like the Olumiant is working. Patient states she is also on MTX 6 tabs weekly and Prednisone 5 mg daily. Patient denies missing any dose of her medication. Patient would like to know about a taper to help with the pain and swelling. Please advise.

## 2019-12-26 NOTE — Telephone Encounter (Signed)
I tried returning patient's call.  She did not pick up the phone.  She may increase prednisone to 10 mg p.o. daily.  Once her symptoms improve then she can decrease prednisone gradually  to 5 mg p.o. daily.

## 2019-12-26 NOTE — Telephone Encounter (Signed)
Patient having a real bad flare, and request rx for Prednisone to be sent in to [facility] on [address]. Patient has 5mg  , and 1mg  Prednisone already if doctor would like for her to take that. Please call to advise.

## 2019-12-27 NOTE — Telephone Encounter (Signed)
Patient advised per Dr. Estanislado Pandy she may increase prednisone to 10 mg p.o. daily.  Once her symptoms improve then she can decrease prednisone gradually  to 5 mg p.o. daily.

## 2019-12-28 ENCOUNTER — Ambulatory Visit: Payer: Medicare Other | Admitting: Rheumatology

## 2019-12-28 ENCOUNTER — Encounter: Payer: Self-pay | Admitting: Rheumatology

## 2019-12-28 ENCOUNTER — Ambulatory Visit: Payer: Self-pay

## 2019-12-28 ENCOUNTER — Encounter: Payer: Medicare Other | Admitting: Physical Therapy

## 2019-12-28 ENCOUNTER — Telehealth: Payer: Self-pay

## 2019-12-28 ENCOUNTER — Other Ambulatory Visit: Payer: Self-pay

## 2019-12-28 VITALS — BP 127/78 | HR 63 | Resp 14 | Ht 64.0 in | Wt 118.2 lb

## 2019-12-28 DIAGNOSIS — M79641 Pain in right hand: Secondary | ICD-10-CM

## 2019-12-28 DIAGNOSIS — M7061 Trochanteric bursitis, right hip: Secondary | ICD-10-CM

## 2019-12-28 DIAGNOSIS — M79642 Pain in left hand: Secondary | ICD-10-CM

## 2019-12-28 DIAGNOSIS — Z79899 Other long term (current) drug therapy: Secondary | ICD-10-CM | POA: Diagnosis not present

## 2019-12-28 DIAGNOSIS — Z8639 Personal history of other endocrine, nutritional and metabolic disease: Secondary | ICD-10-CM

## 2019-12-28 DIAGNOSIS — F5101 Primary insomnia: Secondary | ICD-10-CM

## 2019-12-28 DIAGNOSIS — M0579 Rheumatoid arthritis with rheumatoid factor of multiple sites without organ or systems involvement: Secondary | ICD-10-CM

## 2019-12-28 DIAGNOSIS — M5136 Other intervertebral disc degeneration, lumbar region: Secondary | ICD-10-CM

## 2019-12-28 DIAGNOSIS — M1611 Unilateral primary osteoarthritis, right hip: Secondary | ICD-10-CM

## 2019-12-28 DIAGNOSIS — M51369 Other intervertebral disc degeneration, lumbar region without mention of lumbar back pain or lower extremity pain: Secondary | ICD-10-CM

## 2019-12-28 DIAGNOSIS — Z8659 Personal history of other mental and behavioral disorders: Secondary | ICD-10-CM

## 2019-12-28 DIAGNOSIS — Z8679 Personal history of other diseases of the circulatory system: Secondary | ICD-10-CM

## 2019-12-28 DIAGNOSIS — M4312 Spondylolisthesis, cervical region: Secondary | ICD-10-CM

## 2019-12-28 DIAGNOSIS — L03113 Cellulitis of right upper limb: Secondary | ICD-10-CM

## 2019-12-28 DIAGNOSIS — M810 Age-related osteoporosis without current pathological fracture: Secondary | ICD-10-CM

## 2019-12-28 DIAGNOSIS — S62101D Fracture of unspecified carpal bone, right wrist, subsequent encounter for fracture with routine healing: Secondary | ICD-10-CM

## 2019-12-28 DIAGNOSIS — G35 Multiple sclerosis: Secondary | ICD-10-CM

## 2019-12-28 DIAGNOSIS — Z96642 Presence of left artificial hip joint: Secondary | ICD-10-CM

## 2019-12-28 DIAGNOSIS — R5383 Other fatigue: Secondary | ICD-10-CM

## 2019-12-28 NOTE — Patient Instructions (Addendum)
Standing Labs We placed an order today for your standing lab work.   Please have your standing labs drawn in October and every 3 months  If possible, please have your labs drawn 2 weeks prior to your appointment so that the provider can discuss your results at your appointment.  We have open lab daily Monday through Thursday from 8:30-12:30 PM and 1:30-4:30 PM and Friday from 8:30-12:30 PM and 1:30-4:00 PM at the office of Dr. Kosisochukwu Burningham, [facility].   Please be advised, patients with office appointments requiring lab work will take precedents over walk-in lab work.  If possible, please come for your lab work on Monday and Friday afternoons, as you may experience shorter wait times. The office is located at [address], [address], [place],  [address] No appointment is necessary.   Labs are drawn by [facility]. Please bring your co-pay at the time of your lab draw.  You may receive a bill from [facility] for your lab work.  If you wish to have your labs drawn at another location, please call the office 24 hours in advance to send orders.  If you have any questions regarding directions or hours of operation,  please call [phone].   As a reminder, please drink plenty of water prior to coming for your lab work. Thanks!   COVID-19 vaccine recommendations:   COVID-19 vaccine is recommended for everyone (unless you are allergic to a vaccine component), even if you are on a medication that suppresses your immune system.   If you are on Methotrexate, Cellcept (mycophenolate), [name], Xeljanz, and Olumiant- hold the medication for 1 week after each vaccine. Hold Methotrexate for 2 weeks after the single dose COVID-19 vaccine.   If you are on Orencia subcutaneous injection - hold medication one week prior to and one week after the first COVID-19 vaccine dose (only).   If you are on Orencia IV infusions- time vaccination administration so that the first COVID-19  vaccination will occur four weeks after the infusion and postpone the subsequent infusion by one week.   If you are on Cyclophosphamide or Rituxan infusions please contact your doctor prior to receiving the COVID-19 vaccine.   Do not take Tylenol or ant anti-inflammatory medications (NSAIDs) 24 hours prior to the COVID-19 vaccination.   There is no direct evidence about the efficacy of the COVID-19 vaccine in individuals who are on medications that suppress the immune system.   Even if you are fully vaccinated, and you are on any medications that suppress your immune system, please continue to wear a mask, maintain at least six feet social distance and practice hand hygiene.   If you develop a COVID-19 infection, please contact your PCP or our office to determine if you need antibody infusion.  We anticipate that a booster vaccine will be available soon for immunosuppressed individuals. Please cal our office before receiving your booster dose to make adjustments to your medication regimen.  

## 2019-12-28 NOTE — Telephone Encounter (Signed)
Please apply for rinvoq, per Dr. Estanislado Pandy.   Consent on file in media tab.

## 2019-12-29 ENCOUNTER — Encounter: Payer: Medicare Other | Admitting: Physical Therapy

## 2019-12-29 ENCOUNTER — Telehealth: Payer: Self-pay | Admitting: Rheumatology

## 2019-12-29 DIAGNOSIS — R490 Dysphonia: Secondary | ICD-10-CM | POA: Diagnosis not present

## 2019-12-29 DIAGNOSIS — K219 Gastro-esophageal reflux disease without esophagitis: Secondary | ICD-10-CM | POA: Diagnosis not present

## 2019-12-29 DIAGNOSIS — M0579 Rheumatoid arthritis with rheumatoid factor of multiple sites without organ or systems involvement: Secondary | ICD-10-CM

## 2019-12-29 LAB — COMPLETE METABOLIC PANEL WITH GFR
AG Ratio: 1.6 (calc) (ref 1.0–2.5)
ALT: 19 U/L (ref 6–29)
AST: 27 U/L (ref 10–35)
Albumin: 4.1 g/dL (ref 3.6–5.1)
Alkaline phosphatase (APISO): 44 U/L (ref 37–153)
BUN: 11 mg/dL (ref 7–25)
CO2: 30 mmol/L (ref 20–32)
Calcium: 9.5 mg/dL (ref 8.6–10.4)
Chloride: 99 mmol/L (ref 98–110)
Creat: 0.82 mg/dL (ref 0.60–0.93)
GFR, Est African American: 80 mL/min/{1.73_m2} (ref >=60)
GFR, Est Non African American: 69 mL/min/{1.73_m2} (ref >=60)
Globulin: 2.6 g/dL (calc) (ref 1.9–3.7)
Glucose, Bld: 104 mg/dL — ABNORMAL HIGH (ref 65–99)
Potassium: 4.4 mmol/L (ref 3.5–5.3)
Sodium: 137 mmol/L (ref 135–146)
Total Bilirubin: 0.4 mg/dL (ref 0.2–1.2)
Total Protein: 6.7 g/dL (ref 6.1–8.1)

## 2019-12-29 LAB — CBC WITH DIFFERENTIAL/PLATELET
Absolute Monocytes: 454 cells/uL (ref 200–950)
Basophils Absolute: 22 cells/uL (ref 0–200)
Basophils Relative: 0.4 %
Eosinophils Absolute: 0 cells/uL — ABNORMAL LOW (ref 15–500)
Eosinophils Relative: 0 %
HCT: 35.7 % (ref 35.0–45.0)
Hemoglobin: 12.5 g/dL (ref 11.7–15.5)
Lymphs Abs: 582 cells/uL — ABNORMAL LOW (ref 850–3900)
MCH: 35.7 pg — ABNORMAL HIGH (ref 27.0–33.0)
MCHC: 35 g/dL (ref 32.0–36.0)
MCV: 102 fL — ABNORMAL HIGH (ref 80.0–100.0)
MPV: 9.2 fL (ref 7.5–12.5)
Monocytes Relative: 8.1 %
Neutro Abs: 4542 cells/uL (ref 1500–7800)
Neutrophils Relative %: 81.1 %
Platelets: 376 10*3/uL (ref 140–400)
RBC: 3.5 10*6/uL — ABNORMAL LOW (ref 3.80–5.10)
RDW: 13.1 % (ref 11.0–15.0)
Total Lymphocyte: 10.4 %
WBC: 5.6 10*3/uL (ref 3.8–10.8)

## 2019-12-29 LAB — SEDIMENTATION RATE: Sed Rate: 43 mm/h — ABNORMAL HIGH (ref 0–30)

## 2019-12-29 MED ORDER — RINVOQ 15 MG PO TB24: 15.0000 mg | ORAL_TABLET | Freq: Every day | ORAL | 2 refills | Status: DC

## 2019-12-29 NOTE — Telephone Encounter (Signed)
Submitted a Prior Authorization request to [facility] for [facility] via Cover My Meds. Will update once we receive a response.   (KeyVaughan Sine) - [id number]

## 2019-12-29 NOTE — Telephone Encounter (Signed)
Patient request a call to discuss [place]. Insurance does not cover it. Patient would like to discuss this. Please call.

## 2019-12-29 NOTE — Telephone Encounter (Signed)
Received notification from [facility] regarding a prior authorization for [facility]. Authorization has been APPROVED from 12/29/2019 to 05/25/20.   Authorization # [id number]   Mariella Saa, PharmD, BCACP, CPP Clinical Specialty Pharmacist (Rheumatology and Pulmonology)  12/29/2019 9:33 AM

## 2019-12-29 NOTE — Telephone Encounter (Signed)
Called to discuss [name] approval and cost.  Left voicemail requesting return call.   Mariella Saa, PharmD, [place], CPP Clinical Specialty Pharmacist (Rheumatology and Pulmonology)  12/29/2019 10:04 AM

## 2019-12-29 NOTE — Telephone Encounter (Signed)
Ran test claim, patient's copay for 1 month is $282.03. Patient fills with [facility].

## 2019-12-29 NOTE — Telephone Encounter (Signed)
Patient returned called.  Notified of approval and co-pay.  Patient is willing to pay co-pay.  Prescription sent to [facility].  Scheduled shipment for Monday to deliver Tuesday.   Mariella Saa, PharmD, [place], CPP Clinical Specialty Pharmacist (Rheumatology and Pulmonology)  12/29/2019 10:37 AM

## 2019-12-29 NOTE — Telephone Encounter (Signed)
Approval documented in another encounter.  Closing encounter.   Mariella Saa, PharmD, [place], CPP Clinical Specialty Pharmacist (Rheumatology and Pulmonology)  12/29/2019 10:44 AM

## 2019-12-29 NOTE — Progress Notes (Signed)
Lymphocyte count is low.  We will continue to monitor labs.  CMP is a stable.  Sed rate is elevated which indictates a flare.

## 2020-01-01 ENCOUNTER — Other Ambulatory Visit: Payer: Self-pay | Admitting: Surgery

## 2020-01-01 DIAGNOSIS — Z96642 Presence of left artificial hip joint: Secondary | ICD-10-CM

## 2020-01-02 ENCOUNTER — Other Ambulatory Visit: Payer: Self-pay | Admitting: Family Medicine

## 2020-01-02 ENCOUNTER — Telehealth: Payer: Self-pay | Admitting: Family Medicine

## 2020-01-02 DIAGNOSIS — G5632 Lesion of radial nerve, left upper limb: Secondary | ICD-10-CM

## 2020-01-02 MED FILL — RINVOQ 15 MG TB24: 15 | 30 days supply | Qty: 30 | Fill #0

## 2020-01-02 NOTE — Telephone Encounter (Signed)
MRI ordered

## 2020-01-02 NOTE — Telephone Encounter (Signed)
Please advise. Your last note mentioned possible radial tunnel injection after PT. The patient is asking about having an MRI.

## 2020-01-02 NOTE — Telephone Encounter (Signed)
Pt called stating she has finished her Pt and believed she was supposed to be sent for an MRI by Dr.Hilts; pt would like a CB  [phone]

## 2020-01-03 ENCOUNTER — Telehealth: Payer: Self-pay | Admitting: Rheumatology

## 2020-01-03 ENCOUNTER — Other Ambulatory Visit: Payer: Self-pay | Admitting: Neurology

## 2020-01-03 NOTE — Telephone Encounter (Signed)
Patient will be switching from Al[place] to [name].  Nothing else changes.

## 2020-01-03 NOTE — Telephone Encounter (Signed)
Patient calling to find out if she is to continue taking MTX, and Prednisone once she takes [name] tomorrow? Please call to advise

## 2020-01-03 NOTE — Telephone Encounter (Signed)
Patient will be switching from Oluminat to [name].  Nothing else changes.

## 2020-01-04 ENCOUNTER — Encounter: Payer: Medicare Other | Admitting: Physical Therapy

## 2020-01-06 ENCOUNTER — Encounter: Payer: Self-pay | Admitting: Internal Medicine

## 2020-01-09 ENCOUNTER — Encounter: Payer: Self-pay | Admitting: Internal Medicine

## 2020-01-09 ENCOUNTER — Telehealth: Payer: Self-pay | Admitting: *Deleted

## 2020-01-09 NOTE — Telephone Encounter (Signed)
Patient contacted the office asking about the Covid booster vaccine. Patient states she received the information regarding the vaccine at her last appointment. Patient wanted to clarify that she is to hold her MTX and [name] for 1 week after her booster vaccine. Patient advised she is to hold her medication for 1 week after receiving the booster vaccine.

## 2020-01-10 NOTE — Progress Notes (Signed)
Subjective:    Patient ID: Sherri [name], female    DOB: 1941/02/16, 79 y.o.   MRN: [id number]  HPI The patient is here for follow up   She is taking all of her medications as prescribed.    She saw Mickel Baas one month ago for leg edema. She had mild hand swelling and felt generalized bloating.  This is chronic but flared up.  BNP, CXR done. Her BNP was minimally elevated.  Lasix restarted.    She was on lasix for 35 years.  She stopped it because she did not think she needed it.  Once restarted her edema resolved.    Medications and allergies reviewed with patient and updated if appropriate.  Patient Active Problem List   Diagnosis Date Noted  . Cellulitis of right upper extremity 10/26/2019  . Cervical radiculopathy 06/28/2019  . Carpal tunnel syndrome on both sides 06/28/2019  . Right sided sciatica 04/14/2018  . Trochanteric bursitis of right hip 04/14/2018  . S/P cervical spinal fusion 11/10/2017  . Swelling of left foot 10/26/2017  . Dysphagia 10/26/2017  . Left ankle swelling 10/26/2017  . Localized swelling, mass, and lump of head 08/18/2017  . Gastroesophageal reflux disease 08/06/2017  . Easy bruising 08/06/2017  . Anxiety 04/24/2017  . Mid back pain 03/26/2017  . Left sided chest pain 03/25/2017  . Dyspnea on exertion 03/14/2017  . Hyperkalemia 03/13/2017  . Submandibular gland swelling 01/08/2017  . High risk medication use 09/02/2016  . Urinary frequency 09/02/2016  . Piriformis muscle pain 09/02/2016  . Fatigue 07/22/2016  . Hair loss 07/22/2016  . Right hip pain 04/08/2016  . Right knee pain 02/25/2016  . Dyspepsia 04/05/2015  . Numbness 11/14/2014  . Biceps tendon tear 10/10/2014  . Insomnia 10/10/2014  . Gait disturbance 06/13/2014  . 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 OP (osteoporosis) 01/18/2014  . Other long term (current) drug therapy 01/18/2014  . Other dysphagia 12/29/2013  . Hoarseness 12/29/2013  . 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 Personal history of colonic polyps 07/03/2010  . Nocturia 05/28/2010  . THYROID NODULE 05/17/2008  . Multiple sclerosis ([place]) 05/17/2008  . Chronic fatigue 03/30/2007  . Hyperlipidemia 08/20/2006  . Essential hypertension 08/20/2006  . Rheumatoid arthritis ([place]) 08/20/2006  . CERVICAL CANCER, HX OF 08/20/2006  . GASTRIC ULCER, HX OF 08/20/2006    Current Outpatient Medications on File Prior to Visit  Medication Sig Dispense Refill  . atorvastatin (LIPITOR) 10 MG tablet TAKE 1 TAB ON MONDAYS AND FRIDAY ANNUAL APPT IS DUE W/LABS MUST SEE PROVIDER FOR FUTURE REFILLS 9 tablet 0  . Biotin 5000 MCG TABS Take 5,000 mcg by mouth daily.     . Calcium Carbonate-Vitamin D (CALCIUM + D PO) Take 1 tablet by mouth daily.     . Carboxymethylcellul-Glycerin (LUBRICATING EYE DROPS OP) Apply 1 drop to eye daily as needed (dry eyes).    . carvedilol (COREG) 6.25 MG tablet TAKE 1 TABLET (6.25 MG TOTAL) BY MOUTH 2 (TWO) TIMES DAILY WITH A MEAL. 180 tablet 1  . Cholecalciferol (VITAMIN D3) 2000 units capsule Take 2,000 Units by mouth daily.    . Cyanocobalamin (VITAMIN B-12 PO) Take 1 tablet by mouth daily.    [name] denosumab (PROLIA) 60 MG/ML SOSY injection INJECT 60 MG INTO THE SKIN EVERY 6 (SIX) MONTHS. 1 mL 0  . diclofenac sodium (VOLTAREN) 1 % GEL APPLY 3 GRAMS TO 3 LARGE JOINTS 3 TIMES A DAY AS NEEDED 100  g 0  . famotidine (PEPCID) 40 MG tablet TAKE 1 TABLET BY MOUTH EVERY DAY 90 tablet 1  . folic acid (FOLVITE) 1 MG tablet TAKE 2 TABLETS BY MOUTH EVERY DAY 180 tablet 3  . furosemide (LASIX) 20 MG tablet Take 1 tablet (20 mg total) by mouth daily. 90 tablet 1  . methotrexate (RHEUMATREX) 2.5 MG tablet TAKE 6 TABLETS BY MOUTH ONCE WEEKLY. CAUTION:CHEMOTHERAPY. PROTECT FROM LIGHT. 72 tablet 0  . omeprazole (PRILOSEC) 20 MG capsule Take 20 mg by mouth every morning.    . Oxycodone HCl 10 MG TABS Take 1 tablet by mouth at bedtime as needed 30 tablet 0  . predniSONE (DELTASONE) 5 MG  tablet TAKE 1 TABLET BY MOUTH EVERY DAY WITH BREAKFAST (Patient taking differently: Take 10 mg by mouth daily with breakfast. ) 30 tablet 0  . Pyridoxine HCl (VITAMIN B-6 PO) Take 1 tablet by mouth daily.    . traMADol (ULTRAM) 50 MG tablet TAKE 1 TABLET BY MOUTH THREE TIMES A DAY 90 tablet 5  . Upadacitinib ER ([name]) 15 MG TB24 Take 15 mg by mouth daily. 30 tablet 2  . OLUMIANT 2 MG TABS tablet TAKE ONE TABLET (2MG ) BY MOUTH DAILY (Patient not taking: Reported on 01/11/2020) 30 tablet 2   No current facility-administered medications on file prior to visit.    Past Medical History:  Diagnosis Date  . Anxiety    takes Valium daily as needed  . Basal cell carcinoma 01/21/1988   left nostril (MOHS), sup-right calf (CX35FU)  . Basal cell carcinoma 03/22/1996   right calf (CX35FU)  . Basal cell carcinoma 03/31/1995   left wing nose  . Bruises easily    d/t meds  . Cancer ([place])    basal cell ca, in situ- uterine   . Cataracts, bilateral   . Chronic back pain   . Dizziness    r/t to meds  . GERD (gastroesophageal reflux disease)    no meds on a regular basis but will take Tums if needed  . Headache(784.0)    r/t neck issues  . History of bronchitis 6-45yrs ago  . History of colon polyps    benign  . Hyperlipidemia    takes Atorvastatin on Mondays and Fridays  . Hypertension    takes Ramipril daily  . Joint pain   . Joint swelling   . Multiple sclerosis ([place])   . Neuromuscular disorder ([place])    Dr. Park Liter- [facility], follows M.S.  . Nocturia   . OA (osteoarthritis)    Dr Jani Gravel weekly, RA- hands- knees- feet   . PONV (postoperative nausea and vomiting)    trouble urinating after surgery in 2014  . Rheumatoid arthritis(714.0)    Dr Estanislado Pandy takes [name] daily  . Right wrist fracture   . Skin cancer   . Squamous cell carcinoma of skin 11/02/2001   in situ-right knee (cx58fu)  . Squamous cell carcinoma of skin 11/12/2011   in situ-left forearm  (CX35FU), in situ-left foot (CX35FU)  . Squamous cell carcinoma of skin 01/22/2012   in situ-left lower forearm (txpbx)  . Squamous cell carcinoma of skin 11/17/2012   right shin (txpbx)  . Squamous cell carcinoma of skin 10/28/2013   in situ-right shoulder (CX35FU)  . Squamous cell carcinoma of skin 04/28/2014   well diff-right shin (txpbx)  . Squamous cell carcinoma of skin 11/02/2014   in situ-Left shin (txpbx)  . Squamous cell carcinoma of skin 12/15/2014   in situ-Left  hand (txpbx), bowens-left side chest (txpbx)  . Squamous cell carcinoma of skin 05/09/2015   in situ-left hand (txpbx)   . Squamous cell carcinoma of skin 05/07/2016   in situ-left inner shin,ant, in situ-left inner shin, post, in situ-left outer forearm, in situ-right knuckle  . Squamous cell carcinoma of skin 03/1302018   in situ-left shoulder (txpbx), in situ-right inner shin (txpbx), in situ-top of left foot (txpbx), KA- left forearm (txpbx)  . Squamous cell carcinoma of skin 12/02/2016   in situ-left inner shin (txpbx)  . Squamous cell carcinoma of skin 03/04/2017   in situ-left outer sup, shin (txpbx), in situ- right 2nd knuckle finger (txpbx), in situ- Left wrist (txpbx)  . Squamous cell carcinoma of skin 04/06/2018   in situ-above left knee inner (txpbx)  . Squamous cell carcinoma of skin 04/21/2018   in situ-right top hand (txpbx)  . Squamous cell carcinoma of skin 07/08/2018   in situ-right lower inner shin (txpbx)  . Squamous cell carcinoma of skin 04/27/2019   in situ- Left neck(CX35FU), In situ- right neck (CX35FU)  . Urinary retention    sees Dr.Wrenn about 2 times a yr    Past Surgical History:  Procedure Laterality Date  . ABDOMINAL HYSTERECTOMY     1985  . APPENDECTOMY     with TAH  . BACK SURGERY     several  . BREAST BIOPSY Right    Results were negative  . BROW LIFT Bilateral 10/02/2016   Procedure: BILATERAL LOWER LID BLEPHAROPLASTY;  Surgeon: Clista Bernhardt, MD;  Location: [facility];  Service: Plastics;  Laterality: Bilateral;  . CARPAL TUNNEL RELEASE Right   . cataracts    . CERVICAL FUSION     Dr Joya Salm  . CERVICAL FUSION  12/31/2012   Dr Joya Salm  . CHEST TUBE INSERTION     for traumatic Pneumothorax  . COLONOSCOPY  07/16/2010   normal   . COLONOSCOPY W/ POLYPECTOMY  1997   negative since; Dr Deatra Ina  . ESOPHAGOGASTRODUODENOSCOPY  07/16/2010   normal  . eye lid raise    . EYE SURGERY Bilateral    cataracts removed - /w IOL  . JOINT REPLACEMENT    . LUMBAR FUSION     Dr Joya Salm  . NASAL SINUS SURGERY    . POSTERIOR CERVICAL FUSION/FORAMINOTOMY N/A 12/31/2012   Procedure: POSTERIOR LATERAL CERVICAL FUSION/FORAMINOTOMY LEVEL 1 CERVICAL THREE-FOUR WITH LATERAL MASS SCREWS;  Surgeon: Floyce Stakes, MD;  Location: [facility];  Service: Neurosurgery;  Laterality: N/A;  . PTOSIS REPAIR Bilateral 10/02/2016   Procedure: INTERNAL PTOSIS REPAIR;  Surgeon: Clista Bernhardt, MD;  Location: [facility];  Service: Plastics;  Laterality: Bilateral;  . SKIN BIOPSY    . THYROID SURGERY     R lobe removed- 1972, has grown back - CT last done- 2018  . TOTAL HIP ARTHROPLASTY  12/22/2011   Procedure: TOTAL HIP ARTHROPLASTY;  Surgeon: Kerin Salen, MD;  Location: [facility];  Service: Orthopedics;  Laterality: Left;  . TOTAL SHOULDER ARTHROPLASTY    . TUBAL LIGATION      Social History   Socioeconomic History  . Marital status: Married    Spouse name: Not on file  . Number of children: Not on file  . Years of education: Not on file  . Highest education level: Not on file  Occupational History  . Occupation: retired  Tobacco Use  . Smoking status: Former Smoker    Packs/day: 2.50    Years:  20.00    Pack years: 50.00    Types: Cigarettes    Quit date: 05/26/1981    Years since quitting: 38.6  . Smokeless tobacco: Never Used  . Tobacco comment: Smoked [phone], up to 2.5 ppd  Vaping Use  . Vaping Use: Never used  Substance and Sexual Activity  . Alcohol use: Yes     Alcohol/week: 14.0 standard drinks    Types: 14 Cans of beer per week    Comment: 2% beer  . Drug use: Never  . Sexual activity: Yes  Other Topics Concern  . Not on file  Social History Narrative  . Not on file   Social Determinants of Health   Financial Resource Strain: Low Risk   . Difficulty of Paying Living Expenses: Not very hard  Food Insecurity:   . Worried About Charity fundraiser in the Last Year:   . Arboriculturist in the Last Year:   Transportation Needs:   . Film/video editor (Medical):   [name] Lack of Transportation (Non-Medical):   Physical Activity:   . Days of Exercise per Week:   . Minutes of Exercise per Session:   Stress:   . Feeling of Stress :   Social Connections:   . Frequency of Communication with Friends and Family:   . Frequency of Social Gatherings with Friends and Family:   . Attends Religious Services:   . Active Member of Clubs or Organizations:   . Attends Archivist Meetings:   [name] Marital Status:     Family History  Problem Relation Age of Onset  . Cancer Mother        pancreatic  . Diabetes Mother   . Heart disease Father        Rheumatic  . Cancer Father        ? stomach  . Cancer Sister        stomach  . Cancer Maternal Aunt        X 4; ? primary  . Heart disease Paternal Aunt   . Cancer Maternal Grandmother        cervical  . Colon cancer Other        Aunts  . Multiple sclerosis Daughter   . Hypertension Neg Hx   . Stroke Neg Hx     Review of Systems  Constitutional: Negative for fever.  Respiratory: Negative for cough, shortness of breath and wheezing.   Cardiovascular: Negative for chest pain, palpitations and leg swelling.  Neurological: Negative for light-headedness and headaches.       Objective:   Vitals:   01/11/20 1304  BP: 114/72  Pulse: 82  Temp: 97.9 F (36.6 C)  SpO2: 98%   BP Readings from Last 3 Encounters:  01/11/20 114/72  12/28/19 127/78  12/09/19 116/78   Wt Readings from  Last 3 Encounters:  01/11/20 118 lb (53.5 kg)  12/28/19 118 lb 3.2 oz (53.6 kg)  12/09/19 120 lb 9.6 oz (54.7 kg)   Body mass index is 20.25 kg/m.   Physical Exam    Constitutional: Appears well-developed and well-nourished. No distress.  HENT:  Head: Normocephalic and atraumatic.  Neck: Neck supple. No tracheal deviation present. No thyromegaly present.  No cervical lymphadenopathy Cardiovascular: Normal rate, regular rhythm and normal heart sounds.   No murmur heard. No carotid bruit .  No edema Pulmonary/Chest: Effort normal and breath sounds normal. No respiratory distress. No has no wheezes. No rales.  Skin: Skin is warm  and dry. Not diaphoretic.  Psychiatric: Normal mood and affect. Behavior is normal.      Assessment & Plan:    See Problem List for Assessment and Plan of chronic medical problems.    This visit occurred during the SARS-CoV-2 public health emergency.  Safety protocols were in place, including screening questions prior to the visit, additional usage of staff PPE, and extensive cleaning of exam room while observing appropriate contact time as indicated for disinfecting solutions.

## 2020-01-11 ENCOUNTER — Encounter: Payer: Self-pay | Admitting: Internal Medicine

## 2020-01-11 ENCOUNTER — Other Ambulatory Visit: Payer: Self-pay

## 2020-01-11 ENCOUNTER — Telehealth: Payer: Self-pay | Admitting: Rheumatology

## 2020-01-11 ENCOUNTER — Ambulatory Visit (INDEPENDENT_AMBULATORY_CARE_PROVIDER_SITE_OTHER): Payer: Medicare Other | Admitting: Internal Medicine

## 2020-01-11 VITALS — BP 114/72 | HR 82 | Temp 97.9°F | Ht 64.0 in | Wt 118.0 lb

## 2020-01-11 DIAGNOSIS — I10 Essential (primary) hypertension: Secondary | ICD-10-CM

## 2020-01-11 DIAGNOSIS — R600 Localized edema: Secondary | ICD-10-CM | POA: Diagnosis not present

## 2020-01-11 NOTE — Patient Instructions (Addendum)
   Medications reviewed and updated.  Changes include :   none  Your prescription(s) have been submitted to your pharmacy. Please take as directed and contact our office if you believe you are having problem(s) with the medication(s).    Please followup in 6 months   

## 2020-01-11 NOTE — Telephone Encounter (Signed)
Patient called stating she saw her PCP Dr. Quay Burow today who told her the booster is not effective with some of the biologic medications.  Patient states she had her booster vaccine on Monday, 01/09/20 and is taking [name].  Patient is requesting a return call.

## 2020-01-11 NOTE — Telephone Encounter (Signed)
Patient advised she should hold her [name] and MTX for 1 week after the booster vaccine. Patient advised she should continue to wear a mask, social distance and to use hand hygiene. Patient asked if she should have her antibody level tested. Patient advised to contact her PCP and discuss with her.

## 2020-01-11 NOTE — Assessment & Plan Note (Signed)
Chronic Had edema after stopping medication Restarted two weeks ago Well controlled with lasix daily - continue

## 2020-01-11 NOTE — Assessment & Plan Note (Signed)
Chronic BP well controlled Current regimen effective and well tolerated Continue current medications at current doses  

## 2020-01-12 ENCOUNTER — Other Ambulatory Visit: Payer: Self-pay | Admitting: Internal Medicine

## 2020-01-15 ENCOUNTER — Other Ambulatory Visit: Payer: Self-pay | Admitting: Rheumatology

## 2020-01-16 DIAGNOSIS — Z6821 Body mass index (BMI) 21.0-21.9, adult: Secondary | ICD-10-CM | POA: Diagnosis not present

## 2020-01-16 DIAGNOSIS — Z1231 Encounter for screening mammogram for malignant neoplasm of breast: Secondary | ICD-10-CM | POA: Diagnosis not present

## 2020-01-16 DIAGNOSIS — M816 Localized osteoporosis [Lequesne]: Secondary | ICD-10-CM | POA: Diagnosis not present

## 2020-01-16 NOTE — Telephone Encounter (Signed)
Last Visit: 12/28/2019 Next Visit: 02/29/2020  Current Dose per office note on 12/28/2019: prednisone 5 mg p.o. daily.  Okay to refill per Dr. Estanislado Pandy

## 2020-01-17 ENCOUNTER — Other Ambulatory Visit: Payer: Self-pay

## 2020-01-17 ENCOUNTER — Encounter: Payer: Self-pay | Admitting: Family Medicine

## 2020-01-17 ENCOUNTER — Ambulatory Visit: Payer: Self-pay

## 2020-01-17 ENCOUNTER — Ambulatory Visit (INDEPENDENT_AMBULATORY_CARE_PROVIDER_SITE_OTHER): Payer: Medicare Other | Admitting: Family Medicine

## 2020-01-17 DIAGNOSIS — M79671 Pain in right foot: Secondary | ICD-10-CM | POA: Diagnosis not present

## 2020-01-17 NOTE — Progress Notes (Signed)
Office Visit Note   Patient: Sherri [name]           Date of Birth: 1940-06-03           MRN: [id number] Visit Date: 01/17/2020 Requested by: Binnie Rail, MD [place],  [place] [address] PCP: Binnie Rail, MD  Subjective: Chief Complaint  Patient presents with  . Right Foot - Pain    Right foot pain, top of foot, around the 3rd MT -- started again 4 days ago. Was injected by Dr. Sharol Given in April.    HPI: She is here with recurrent right foot pain.  Injection in April helped until about a week ago.  She is currently off her arthritis medicine in order to get the booster vaccine, and her foot started hurting.  Pain near the second and third MTP joints.  She is going [place] the mountains in [place] for a hiking trip with her daughter and granddaughter.              ROS:   All other systems were reviewed and are negative.  Objective: Vital Signs: There were no vitals taken for this visit.  Physical Exam:  General:  Alert and oriented, in no acute distress. Pulm:  Breathing unlabored. Psy:  Normal mood, congruent affect. Skin: No erythema Right foot: She is tender to palpation between the second and third metatarsal heads from dorsal and plantar approach.  Mild pain with medial/lateral Squeeze of the metatarsals.  No pain with flexion or extension of the toes.  Imaging: No results found.  Assessment & Plan: 1.  Recurrent right foot pain, suspect Morton's neuroma -Discussed with her and she wants another injection today.  Follow-up as needed.     Procedures: Right foot injection: After sterile prep with Betadine, injected 1 cc 1% lidocaine without epinephrine and 20 mg methylprednisolone between the second and third MTP joints without complication.    PMFS History: Patient Active Problem List   Diagnosis Date Noted  . Bilateral leg edema 01/11/2020  . Cellulitis of right upper extremity 10/26/2019  . Cervical radiculopathy 06/28/2019  . Carpal  tunnel syndrome on both sides 06/28/2019  . Bilateral cataracts 01/11/2019  . Dizziness 01/11/2019  . Postoperative nausea and vomiting 01/11/2019  . Right sided sciatica 04/14/2018  . Trochanteric bursitis of right hip 04/14/2018  . S/P cervical spinal fusion 11/10/2017  . Dysphagia 10/26/2017  . Gastroesophageal reflux disease 08/06/2017  . Easy bruising 08/06/2017  . Anxiety 04/24/2017  . Mid back pain 03/26/2017  . Dyspnea on exertion 03/14/2017  . Hyperkalemia 03/13/2017  . Submandibular gland swelling 01/08/2017  . High risk medication use 09/02/2016  . Piriformis muscle pain 09/02/2016  . Right hip pain 04/08/2016  . Right knee pain 02/25/2016  . Dyspepsia 04/05/2015  . Numbness 11/14/2014  . Biceps tendon tear 10/10/2014  . Insomnia 10/10/2014  . Gait disturbance 06/13/2014  . 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 OP (osteoporosis) 01/18/2014  . Other long term (current) drug therapy 01/18/2014  . Other dysphagia 12/29/2013  . Hoarseness 12/29/2013  . 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 Personal history of colonic polyps 07/03/2010  . Nocturia 05/28/2010  . THYROID NODULE 05/17/2008  . Multiple sclerosis ([place]) 05/17/2008  . Chronic fatigue 03/30/2007  . Hyperlipidemia 08/20/2006  . Essential hypertension 08/20/2006  . Rheumatoid arthritis ([place]) 08/20/2006  . CERVICAL CANCER, HX OF 08/20/2006  . GASTRIC ULCER, HX OF 08/20/2006   Past  Medical History:  Diagnosis Date  . Anxiety    takes Valium daily as needed  . Basal cell carcinoma 01/21/1988   left nostril (MOHS), sup-right calf (CX35FU)  . Basal cell carcinoma 03/22/1996   right calf (CX35FU)  . Basal cell carcinoma 03/31/1995   left wing nose  . Bruises easily    d/t meds  . Cancer ([place])    basal cell ca, in situ- uterine   . Cataracts, bilateral   . Chronic back pain   . Dizziness    r/t to meds  . GERD (gastroesophageal reflux disease)    no meds on a regular basis but will  take Tums if needed  . Headache(784.0)    r/t neck issues  . History of bronchitis 6-47yrs ago  . History of colon polyps    benign  . Hyperlipidemia    takes Atorvastatin on Mondays and Fridays  . Hypertension    takes Ramipril daily  . Joint pain   . Joint swelling   . Multiple sclerosis ([place])   . Neuromuscular disorder ([place])    Dr. Park Liter- [facility], follows M.S.  . Nocturia   . OA (osteoarthritis)    Dr Jani Gravel weekly, RA- hands- knees- feet   . PONV (postoperative nausea and vomiting)    trouble urinating after surgery in 2014  . Postoperative nausea and vomiting 01/11/2019  . Rheumatoid arthritis(714.0)    Dr Estanislado Pandy takes [name] daily  . Right wrist fracture   . Skin cancer   . Squamous cell carcinoma of skin 11/02/2001   in situ-right knee (cx30fu)  . Squamous cell carcinoma of skin 11/12/2011   in situ-left forearm (CX35FU), in situ-left foot (CX35FU)  . Squamous cell carcinoma of skin 01/22/2012   in situ-left lower forearm (txpbx)  . Squamous cell carcinoma of skin 11/17/2012   right shin (txpbx)  . Squamous cell carcinoma of skin 10/28/2013   in situ-right shoulder (CX35FU)  . Squamous cell carcinoma of skin 04/28/2014   well diff-right shin (txpbx)  . Squamous cell carcinoma of skin 11/02/2014   in situ-Left shin (txpbx)  . Squamous cell carcinoma of skin 12/15/2014   in situ-Left hand (txpbx), bowens-left side chest (txpbx)  . Squamous cell carcinoma of skin 05/09/2015   in situ-left hand (txpbx)   . Squamous cell carcinoma of skin 05/07/2016   in situ-left inner shin,ant, in situ-left inner shin, post, in situ-left outer forearm, in situ-right knuckle  . Squamous cell carcinoma of skin 03/1302018   in situ-left shoulder (txpbx), in situ-right inner shin (txpbx), in situ-top of left foot (txpbx), KA- left forearm (txpbx)  . Squamous cell carcinoma of skin 12/02/2016   in situ-left inner shin (txpbx)  . Squamous cell carcinoma of  skin 03/04/2017   in situ-left outer sup, shin (txpbx), in situ- right 2nd knuckle finger (txpbx), in situ- Left wrist (txpbx)  . Squamous cell carcinoma of skin 04/06/2018   in situ-above left knee inner (txpbx)  . Squamous cell carcinoma of skin 04/21/2018   in situ-right top hand (txpbx)  . Squamous cell carcinoma of skin 07/08/2018   in situ-right lower inner shin (txpbx)  . Squamous cell carcinoma of skin 04/27/2019   in situ- Left neck(CX35FU), In situ- right neck (CX35FU)  . Urinary retention    sees Dr.Wrenn about 2 times a yr    Family History  Problem Relation Age of Onset  . Cancer Mother        pancreatic  . Diabetes  Mother   . Heart disease Father        Rheumatic  . Cancer Father        ? stomach  . Cancer Sister        stomach  . Cancer Maternal Aunt        X 4; ? primary  . Heart disease Paternal Aunt   . Cancer Maternal Grandmother        cervical  . Colon cancer Other        Aunts  . Multiple sclerosis Daughter   . Hypertension Neg Hx   . Stroke Neg Hx     Past Surgical History:  Procedure Laterality Date  . ABDOMINAL HYSTERECTOMY     1985  . APPENDECTOMY     with TAH  . BACK SURGERY     several  . BREAST BIOPSY Right    Results were negative  . BROW LIFT Bilateral 10/02/2016   Procedure: BILATERAL LOWER LID BLEPHAROPLASTY;  Surgeon: Clista Bernhardt, MD;  Location: [facility];  Service: Plastics;  Laterality: Bilateral;  . CARPAL TUNNEL RELEASE Right   . cataracts    . CERVICAL FUSION     Dr Joya Salm  . CERVICAL FUSION  12/31/2012   Dr Joya Salm  . CHEST TUBE INSERTION     for traumatic Pneumothorax  . COLONOSCOPY  07/16/2010   normal   . COLONOSCOPY W/ POLYPECTOMY  1997   negative since; Dr Deatra Ina  . ESOPHAGOGASTRODUODENOSCOPY  07/16/2010   normal  . eye lid raise    . EYE SURGERY Bilateral    cataracts removed - /w IOL  . JOINT REPLACEMENT    . LUMBAR FUSION     Dr Joya Salm  . NASAL SINUS SURGERY    . POSTERIOR CERVICAL FUSION/FORAMINOTOMY  N/A 12/31/2012   Procedure: POSTERIOR LATERAL CERVICAL FUSION/FORAMINOTOMY LEVEL 1 CERVICAL THREE-FOUR WITH LATERAL MASS SCREWS;  Surgeon: Floyce Stakes, MD;  Location: [facility];  Service: Neurosurgery;  Laterality: N/A;  . PTOSIS REPAIR Bilateral 10/02/2016   Procedure: INTERNAL PTOSIS REPAIR;  Surgeon: Clista Bernhardt, MD;  Location: [facility];  Service: Plastics;  Laterality: Bilateral;  . SKIN BIOPSY    . THYROID SURGERY     R lobe removed- 1972, has grown back - CT last done- 2018  . TOTAL HIP ARTHROPLASTY  12/22/2011   Procedure: TOTAL HIP ARTHROPLASTY;  Surgeon: Kerin Salen, MD;  Location: [facility];  Service: Orthopedics;  Laterality: Left;  . TOTAL SHOULDER ARTHROPLASTY    . TUBAL LIGATION     Social History   Occupational History  . Occupation: retired  Tobacco Use  . Smoking status: Former Smoker    Packs/day: 2.50    Years: 20.00    Pack years: 50.00    Types: Cigarettes    Quit date: 05/26/1981    Years since quitting: 38.6  . Smokeless tobacco: Never Used  . Tobacco comment: Smoked [phone], up to 2.5 ppd  Vaping Use  . Vaping Use: Never used  Substance and Sexual Activity  . Alcohol use: Yes    Alcohol/week: 14.0 standard drinks    Types: 14 Cans of beer per week    Comment: 2% beer  . Drug use: Never  . Sexual activity: Yes

## 2020-01-21 ENCOUNTER — Other Ambulatory Visit: Payer: Self-pay | Admitting: Internal Medicine

## 2020-01-23 ENCOUNTER — Other Ambulatory Visit: Payer: Self-pay | Admitting: Family Medicine

## 2020-01-23 MED ORDER — OXYCODONE HCL 10 MG PO TABS: ORAL_TABLET | ORAL | 0 refills | Status: DC

## 2020-01-23 NOTE — Telephone Encounter (Signed)
Pt is needing a refill on her Oxycodone HCl 10 MG TABS sent in to the [facility] on [address].

## 2020-01-23 NOTE — Telephone Encounter (Signed)
Next appt due 02-02-20 Dr. Felecia Shelling.

## 2020-01-25 ENCOUNTER — Ambulatory Visit
Admission: RE | Admit: 2020-01-25 | Discharge: 2020-01-25 | Disposition: A | Discharge status: Home / Self Care | Payer: Medicare Other | Source: Ambulatory Visit | Attending: Family Medicine | Admitting: Family Medicine

## 2020-01-25 DIAGNOSIS — G8929 Other chronic pain: Secondary | ICD-10-CM | POA: Diagnosis not present

## 2020-01-25 DIAGNOSIS — S5002XA Contusion of left elbow, initial encounter: Secondary | ICD-10-CM | POA: Diagnosis not present

## 2020-01-25 DIAGNOSIS — S53432A Radial collateral ligament sprain of left elbow, initial encounter: Secondary | ICD-10-CM | POA: Diagnosis not present

## 2020-01-25 DIAGNOSIS — S53442A Ulnar collateral ligament sprain of left elbow, initial encounter: Secondary | ICD-10-CM | POA: Diagnosis not present

## 2020-01-25 DIAGNOSIS — G5632 Lesion of radial nerve, left upper limb: Secondary | ICD-10-CM

## 2020-01-25 MED FILL — RINVOQ 15 MG TB24: 15 | 30 days supply | Qty: 30 | Fill #1

## 2020-01-26 ENCOUNTER — Telehealth: Payer: Self-pay | Admitting: Family Medicine

## 2020-01-26 NOTE — Telephone Encounter (Signed)
Coming in at 3:40 tomorrow.

## 2020-01-26 NOTE — Telephone Encounter (Signed)
Elbow MRI scan shows partial tearing of ligaments around the elbow as well as chronic inflammation/tendinopathy of the extensor tendons at the elbow. This would certainly explain the ongoing pain. Could contemplate cortisone injection presuming symptoms are still significant.

## 2020-01-27 ENCOUNTER — Ambulatory Visit (INDEPENDENT_AMBULATORY_CARE_PROVIDER_SITE_OTHER): Payer: Medicare Other | Admitting: Family Medicine

## 2020-01-27 ENCOUNTER — Other Ambulatory Visit: Payer: Self-pay

## 2020-01-27 ENCOUNTER — Encounter: Payer: Self-pay | Admitting: Family Medicine

## 2020-01-27 DIAGNOSIS — M25522 Pain in left elbow: Secondary | ICD-10-CM | POA: Diagnosis not present

## 2020-01-27 NOTE — Progress Notes (Signed)
Office Visit Note   Patient: Sherri [name]           Date of Birth: [birth year]           MRN: [id number] Visit Date: 01/27/2020 Requested by: Binnie Rail, MD [place],  [place] [id number] PCP: Binnie Rail, MD  Subjective: Chief Complaint  Patient presents with  . Left Elbow - Pain    Cortisone injection    HPI: She is here for follow-up chronic left elbow pain. Recent MRI scan showed common extensor tendinopathy with partial tearing of collateral ligaments. She continues to have pain on a daily basis.               ROS:   All other systems were reviewed and are negative.  Objective: Vital Signs: There were no vitals taken for this visit.  Physical Exam:  General:  Alert and oriented, in no acute distress. Pulm:  Breathing unlabored. Psy:  Normal mood, congruent affect.  Left elbow: Full range of motion, no effusion. Point tender at the common extensor tendon just distal to the lateral epicondyle. Mild pain at the radial tunnel.  Imaging: No results found.  Assessment & Plan: 1. 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 She will follow-up as needed.     Procedures: Left elbow injection: After sterile prep with Betadine, injected 3 cc 1% lidocaine without epinephrine and 20 mg methylprednisolone into the area of maximum tenderness at the common extensor tendon.    PMFS History: Patient Active Problem List   Diagnosis Date Noted  . Bilateral leg edema 01/11/2020  . Cellulitis of right upper extremity 10/26/2019  . Cervical radiculopathy 06/28/2019  . Carpal tunnel syndrome on both sides 06/28/2019  . Bilateral cataracts 01/11/2019  . Dizziness 01/11/2019  . Postoperative nausea and vomiting 01/11/2019  . Right sided sciatica 04/14/2018  . Trochanteric bursitis of right hip 04/14/2018  . S/P cervical spinal fusion 11/10/2017    . Dysphagia 10/26/2017  . Gastroesophageal reflux disease 08/06/2017  . Easy bruising 08/06/2017  . Anxiety 04/24/2017  . Mid back pain 03/26/2017  . Dyspnea on exertion 03/14/2017  . Hyperkalemia 03/13/2017  . Submandibular gland swelling 01/08/2017  . High risk medication use 09/02/2016  . Piriformis muscle pain 09/02/2016  . Right hip pain 04/08/2016  . Right knee pain 02/25/2016  . Dyspepsia 04/05/2015  . Numbness 11/14/2014  . Biceps tendon tear 10/10/2014  . Insomnia 10/10/2014  . Gait disturbance 06/13/2014  . 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 OP (osteoporosis) 01/18/2014  . Other long term (current) drug therapy 01/18/2014  . Other dysphagia 12/29/2013  . Hoarseness 12/29/2013  . 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 Personal history of colonic polyps 07/03/2010  . Nocturia 05/28/2010  . THYROID NODULE 05/17/2008  . Multiple sclerosis ([place]) 05/17/2008  . Chronic fatigue 03/30/2007  . Hyperlipidemia 08/20/2006  . Essential hypertension 08/20/2006  . Rheumatoid arthritis ([place]) 08/20/2006  . CERVICAL CANCER, HX OF 08/20/2006  . GASTRIC ULCER, HX OF 08/20/2006   Past Medical History:  Diagnosis Date  . Anxiety    takes Valium daily as needed  . Basal cell carcinoma 01/21/1988   left nostril (MOHS), sup-right calf (CX35FU)  . Basal cell carcinoma 03/22/1996   right calf (CX35FU)  . Basal cell carcinoma 03/31/1995   left wing nose  . Bruises easily  d/t meds  . Cancer ([place])    basal cell ca, in situ- uterine   . Cataracts, bilateral   . Chronic back pain   . Dizziness    r/t to meds  . GERD (gastroesophageal reflux disease)    no meds on a regular basis but will take Tums if needed  . Headache(784.0)    r/t neck issues  . History of bronchitis 6-63yrs ago  . History of colon polyps    benign  . Hyperlipidemia    takes Atorvastatin on Mondays and Fridays  . Hypertension    takes Ramipril daily  . Joint pain   . Joint swelling    . Multiple sclerosis ([place])   . Neuromuscular disorder ([place])    Dr. Park Liter- [facility], follows M.S.  . Nocturia   . OA (osteoarthritis)    Dr Jani Gravel weekly, RA- hands- knees- feet   . PONV (postoperative nausea and vomiting)    trouble urinating after surgery in 2014  . Postoperative nausea and vomiting 01/11/2019  . Rheumatoid arthritis(714.0)    Dr Estanislado Pandy takes [name] daily  . Right wrist fracture   . Skin cancer   . Squamous cell carcinoma of skin 11/02/2001   in situ-right knee (cx19fu)  . Squamous cell carcinoma of skin 11/12/2011   in situ-left forearm (CX35FU), in situ-left foot (CX35FU)  . Squamous cell carcinoma of skin 01/22/2012   in situ-left lower forearm (txpbx)  . Squamous cell carcinoma of skin 11/17/2012   right shin (txpbx)  . Squamous cell carcinoma of skin 10/28/2013   in situ-right shoulder (CX35FU)  . Squamous cell carcinoma of skin 04/28/2014   well diff-right shin (txpbx)  . Squamous cell carcinoma of skin 11/02/2014   in situ-Left shin (txpbx)  . Squamous cell carcinoma of skin 12/15/2014   in situ-Left hand (txpbx), bowens-left side chest (txpbx)  . Squamous cell carcinoma of skin 05/09/2015   in situ-left hand (txpbx)   . Squamous cell carcinoma of skin 05/07/2016   in situ-left inner shin,ant, in situ-left inner shin, post, in situ-left outer forearm, in situ-right knuckle  . Squamous cell carcinoma of skin 03/1302018   in situ-left shoulder (txpbx), in situ-right inner shin (txpbx), in situ-top of left foot (txpbx), KA- left forearm (txpbx)  . Squamous cell carcinoma of skin 12/02/2016   in situ-left inner shin (txpbx)  . Squamous cell carcinoma of skin 03/04/2017   in situ-left outer sup, shin (txpbx), in situ- right 2nd knuckle finger (txpbx), in situ- Left wrist (txpbx)  . Squamous cell carcinoma of skin 04/06/2018   in situ-above left knee inner (txpbx)  . Squamous cell carcinoma of skin 04/21/2018   in situ-right  top hand (txpbx)  . Squamous cell carcinoma of skin 07/08/2018   in situ-right lower inner shin (txpbx)  . Squamous cell carcinoma of skin 04/27/2019   in situ- Left neck(CX35FU), In situ- right neck (CX35FU)  . Urinary retention    sees Dr.Wrenn about 2 times a yr    Family History  Problem Relation Age of Onset  . Cancer Mother        pancreatic  . Diabetes Mother   . Heart disease Father        Rheumatic  . Cancer Father        ? stomach  . Cancer Sister        stomach  . Cancer Maternal Aunt        X 4; ? primary  . Heart  disease Paternal Aunt   . Cancer Maternal Grandmother        cervical  . Colon cancer Other        Aunts  . Multiple sclerosis Daughter   . Hypertension Neg Hx   . Stroke Neg Hx     Past Surgical History:  Procedure Laterality Date  . ABDOMINAL HYSTERECTOMY     1985  . APPENDECTOMY     with TAH  . BACK SURGERY     several  . BREAST BIOPSY Right    Results were negative  . BROW LIFT Bilateral 10/02/2016   Procedure: BILATERAL LOWER LID BLEPHAROPLASTY;  Surgeon: Clista Bernhardt, MD;  Location: [facility];  Service: Plastics;  Laterality: Bilateral;  . CARPAL TUNNEL RELEASE Right   . cataracts    . CERVICAL FUSION     Dr Joya Salm  . CERVICAL FUSION  12/31/2012   Dr Joya Salm  . CHEST TUBE INSERTION     for traumatic Pneumothorax  . COLONOSCOPY  07/16/2010   normal   . COLONOSCOPY W/ POLYPECTOMY  1997   negative since; Dr Deatra Ina  . ESOPHAGOGASTRODUODENOSCOPY  07/16/2010   normal  . eye lid raise    . EYE SURGERY Bilateral    cataracts removed - /w IOL  . JOINT REPLACEMENT    . LUMBAR FUSION     Dr Joya Salm  . NASAL SINUS SURGERY    . POSTERIOR CERVICAL FUSION/FORAMINOTOMY N/A 12/31/2012   Procedure: POSTERIOR LATERAL CERVICAL FUSION/FORAMINOTOMY LEVEL 1 CERVICAL THREE-FOUR WITH LATERAL MASS SCREWS;  Surgeon: Floyce Stakes, MD;  Location: [facility];  Service: Neurosurgery;  Laterality: N/A;  . PTOSIS REPAIR Bilateral 10/02/2016   Procedure:  INTERNAL PTOSIS REPAIR;  Surgeon: Clista Bernhardt, MD;  Location: [facility];  Service: Plastics;  Laterality: Bilateral;  . SKIN BIOPSY    . THYROID SURGERY     R lobe removed- 1972, has grown back - CT last done- 2018  . TOTAL HIP ARTHROPLASTY  12/22/2011   Procedure: TOTAL HIP ARTHROPLASTY;  Surgeon: Kerin Salen, MD;  Location: [facility];  Service: Orthopedics;  Laterality: Left;  . TOTAL SHOULDER ARTHROPLASTY    . TUBAL LIGATION     Social History   Occupational History  . Occupation: retired  Tobacco Use  . Smoking status: Former Smoker    Packs/day: 2.50    Years: 20.00    Pack years: 50.00    Types: Cigarettes    Quit date: 05/26/1981    Years since quitting: 38.6  . Smokeless tobacco: Never Used  . Tobacco comment: Smoked [phone], up to 2.5 ppd  Vaping Use  . Vaping Use: Never used  Substance and Sexual Activity  . Alcohol use: Yes    Alcohol/week: 14.0 standard drinks    Types: 14 Cans of beer per week    Comment: 2% beer  . Drug use: Never  . Sexual activity: Yes

## 2020-01-31 DIAGNOSIS — H6123 Impacted cerumen, bilateral: Secondary | ICD-10-CM | POA: Diagnosis not present

## 2020-01-31 DIAGNOSIS — H903 Sensorineural hearing loss, bilateral: Secondary | ICD-10-CM | POA: Diagnosis not present

## 2020-02-02 ENCOUNTER — Encounter: Payer: Self-pay | Admitting: Neurology

## 2020-02-02 ENCOUNTER — Other Ambulatory Visit: Payer: Self-pay

## 2020-02-02 ENCOUNTER — Ambulatory Visit: Payer: Medicare Other | Admitting: Neurology

## 2020-02-02 ENCOUNTER — Encounter: Payer: Self-pay | Admitting: Internal Medicine

## 2020-02-02 VITALS — BP 126/69 | HR 71 | Ht 64.0 in | Wt 117.0 lb

## 2020-02-02 DIAGNOSIS — R5383 Other fatigue: Secondary | ICD-10-CM | POA: Diagnosis not present

## 2020-02-02 DIAGNOSIS — M0579 Rheumatoid arthritis with rheumatoid factor of multiple sites without organ or systems involvement: Secondary | ICD-10-CM | POA: Diagnosis not present

## 2020-02-02 DIAGNOSIS — G35 Multiple sclerosis: Secondary | ICD-10-CM | POA: Diagnosis not present

## 2020-02-02 DIAGNOSIS — G2581 Restless legs syndrome: Secondary | ICD-10-CM | POA: Insufficient documentation

## 2020-02-02 DIAGNOSIS — Z981 Arthrodesis status: Secondary | ICD-10-CM

## 2020-02-02 DIAGNOSIS — R269 Unspecified abnormalities of gait and mobility: Secondary | ICD-10-CM | POA: Diagnosis not present

## 2020-02-02 MED ORDER — TRAMADOL HCL 50 MG PO TABS
50.0000 mg | ORAL_TABLET | Freq: Two times a day (BID) | ORAL | 5 refills | Status: DC
Start: 2020-02-02 — End: 2020-08-22

## 2020-02-02 MED ORDER — OXYCODONE HCL 5 MG PO TABS: 5.0000 mg | ORAL_TABLET | ORAL | 0 refills | Status: DC | PRN

## 2020-02-02 NOTE — Progress Notes (Signed)
[facility]  PATIENT: Sherri [name] DOB: 04/19/1941  REFERRING CLINICIAN: Unice Cobble  HISTORY FROM: patient REASON FOR VISIT: Multiple sclerosis   HISTORICAL  CHIEF COMPLAINT:  Chief Complaint  Patient presents with  . Follow-up    RM 13, alone. Last seen 09/26/19 by AL,NP  . Multiple Sclerosis    off DMT  . Pain    Takes tramadol, oxycodone  . Facial Pain    Takes lamotrigine     HISTORY OF PRESENT ILLNESS:  Sherri [name] is a 79 y.o. woman who was diagnosed with relapsing remitting MS in 2010.     Update 02/02/2020: She is generally doing well and denies any new MS symptoms.    She stopped all DMTs about 2 years ago (was on Aubagio).    Her only current neurologic issue is some dysesthesias in her right leg.   Her right leg jerks at night  reporting more pain in her hands.   She takes tramadol 50 mg x 2 during the day and oxycodone 10 mg po qHS.   She sleeps better with the oxycodone and her leg jerks are much better with it.     NCV/EMG 06/28/2019 showed mild right C8 radiculopathy and no CTS.  She feels her RA is doing better since starting [name].  She has pain mostly in her hands.   She has OA as well as RA.     She notes mild left facial pain, but less than earlier.      She also has RA and is on Methotrexate.  She sees Dr. Estanislado Pandy for RA.   She is not on any the disease modifying therapy for the MS.    She has had her covid-19 vaccination and booster shot a couple weeks ago.     Update 10/07/18 (virtual) Her MS is stable.   She stopped Aubagio due to hair loss and did not go back on a DMT.  Her gait has  worsened but she notes more orthopedics/RA issues.   Strenth is poor in prox right arm due to a biceps tendon tear.    She is on methotrexate and Olumiant (baricitinib) and prednisone.   These have helped her RA.   She is also on Prolia for osteoporosis.  She had a bad infection after her cat jumped on her and pierced her skin.  We  discussed RA meds may alter the immune system.  She is aware and is trying to follow CDC guidance to reduce her Covid-19 risk.    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  She has had hip pain since.  She saw orthopedics and her replaced hip easily pops out of her socket (with bending over a few times).  She has been able to pop it back in but the pain can be severe.    Anxiety is higher since the hip problems worsened as she is constantly worried about it dislocating again.   She does not feel more depressed but is more worried with her health issues and CoVid-19   She has not had anymore episodes of passing out.  One episidoe occurred with Valsalva last year      Update 04/14/2018: She is being switched from [place] to [name] (Upadacitinib) and then to  Olumiant (baricitinib).  [name] was not covered by insurance.   She has been told she had bursitis in the right hip    Pain is in the buttock  to the lateral thigh and proximal knee.   Due to foot pain she feels her gait is poor.   Pain is in the ball of the foot and the toes.   The MS has ben mostly stable.   She did not tolerate Aubagio due to hair loss and stopped.    She is currently off a DMT.    Her gait is worse mostly due to more pain.   However, balance seems worse.  She denies numbness.    Bladder function is unchanged.       She had one episode of syncope while defecating and when she fell, she broke bones in the right foot  Update 11/10/2017: She feels her MS has been stable.  She is off of any MS DMT but is on Xeljanz and Methotrexate for RA.    She is on prednisone 5 mg (10 mg for flares) daily.   Pain has been much worse on attempts to go off prednisone.    Her MS is mild so I'm fine with her off a DMT.     Aubagio caused a lot of hair loss.    We discussed avoiding Enbrel/Remicade/Humira/Simponi  She was found to have C7, C8 and T1 radiculopathy on recent NCV/EMG.   She has fusion from C3  to C7.   She has a cervical spine MRI later this week.    Cervical spine 3 or 4 years ago showed fusion from C3-C7.  There is possible pseudoarthrosis at C6-C7 and minimal spondylolisthesis at C7-T1.  There is  She has had some dysphagia but it is felt possibly due to an enlarged thyroid (felt due to cysts on Bx)     Update 06/09/2017: She feels the MS is stable.   No new weakness or numbness and gait is unchanged. She remains on methotrexate and Xeljanz for her rheumatoid arthritis. She is off of her MS disease modifying therapy. She did not tolerate leflunomide due to hair loss.  She reports more hip pain that radiates into her leg all the way to the side of the foot..   A recent hip injection by Dr. Estanislado Pandy did not help.   Of note she has had L4L5 and L5S1 fusion by Dr. Joya Salm in the past.     Her gabapentin dose is currently 100 in am and 100 mg at night.   He had trouble tolerating 200 mg in the morning and 300 mg at night.   Gabapentin did help her RLS and she tolerates it well.   It does not help her pain..      From 10/30/2016: MS:   She feels her MS is stable.    She also has RA and is on Xeljanz and prednisone.    She is not on any DMT for her MS.     She denies any recent exacerbation.     Her right leg sometimes gives out on her but the changes have been gradual.     Vision:    She had recent surgery for ptosis (10/02/16) after she had cataract surgery (still had reduced peripheral vision after initil surgery when she noted the problem more).      RA:   She remains on Xeljanz and methotrexate.   MTX dose reduced as higher does may have affected renal function and dose was reduced.     Gait/strength:    She feels gait is worse and she notes dragging her right foot more, even for short distances.  He also notes more cramps in the right leg.   The right leg also sometimes gives out randomly.  Gait also affected by right hip pain and she also had left THR in the past.      She feels  gait is not bad enough to use a cane but does feel off balanced.    The legs (R>L) also ache a lot more.  Tramadol during the day and oxycodone and valium at night help the pain a lot.   Leg cramps are worse when she lays down.    Sensation:   She notes mild foot numbness bilaterally which is stable.     She has stable left facial numbness  in V2 and V3 > V1.   This was her first symptom (tongue was numb many years before diagnosis).    No arm numbness.     Bladder:   She has urinary frequency and hesitancy.   She does not completely empty (has PVR's at urology).    She has no recent UTI's,       Fatigue/sleep:     Fatigue is mild most days but worse a day after 'overdoing it'.    She is sleeping well with Valium 5 mg and Oxycodone 10 mg at bedtime .   He sleeps better with her pain under better control.    She is active, walking daily.    Mood/cognition:     Mood is doing well.    She does not note that she has any significant cognitive changes.       MS History:   When she was in her late 5s she had an episode of right-sided numbness and she lost the use of her right arm for a couple of weeks. At that time, she was told that she might have MS. Over the next 30 or 40 years she had several more similar but milder episodes. In 2010, she had an MRI of the cervical spine (for arm pain) and it showed foci c/w multiple sclerosis. An MRI of the brain was also performed  shortly thereafter showing white matter lesions consistent with the diagnosis.  Initially, she was placed on Avonex. She also has rheumatoid arthritis and was placed on methotrexate and then a combination of methotrexate with [name]. We decided to stop the Avonex as she probably would get some benefit from those medications for the MS as well. For the most part her MS has been stable over the past 5 years.However, early 2016,  she had numbness in the left face and weakness in her legs   REVIEW OF SYSTEMS:  Constitutional: No fevers, chills,  sweats, or change in appetite Eyes: No visual changes, double vision, eye pain Ear, nose and throat: No hearing loss, ear pain, nasal congestion, sore throat Cardiovascular: No chest pain, palpitations Respiratory:  No shortness of breath at rest or with exertion.   No wheezes GastrointestinaI: No nausea, vomiting, diarrhea, abdominal pain, fecal incontinence Genitourinary:  as above. Musculoskeletal:  No neck pain, back pain Integumentary: No rash, pruritus, skin lesions Neurological: as above Psychiatric: he has had a lot of family stress leading to some depression and more anxiety. Endocrine: No palpitations, diaphoresis, change in appetite, change in weigh or increased thirst Hematologic/Lymphatic:  No anemia, purpura, petechiae. Allergic/Immunologic: No itchy/runny eyes, nasal congestion, recent allergic reactions, rashes  ALLERGIES: Allergies  Allergen Reactions  . Darvon [Propoxyphene Hcl] Shortness Of Breath    ? Dose Related ? Lowered respirations greatly  .  Meperidine Hcl Shortness Of Breath  . Penicillins Hives and Other (See Comments)    Has patient had a PCN reaction causing immediate rash, facial/tongue/throat swelling, SOB or lightheadedness with hypotension: No SEVERE RASH INVOLVING MUCUS MEMBRANES or SKIN NECROSIS: #  #  #  YES  #  #  #  Has patient had a PCN reaction that required hospitalization No Has patient had a PCN reaction occurring within the last 10 years: No.   . Azathioprine Other (See Comments)    Weakness  . Leflunomide     unknown  . Ezetimibe-Simvastatin Nausea And Vomiting    HOME MEDICATIONS:   PAST MEDICAL HISTORY: Past Medical History:  Diagnosis Date  . Anxiety    takes Valium daily as needed  . Basal cell carcinoma 01/21/1988   left nostril (MOHS), sup-right calf (CX35FU)  . Basal cell carcinoma 03/22/1996   right calf (CX35FU)  . Basal cell carcinoma 03/31/1995   left wing nose  . Bruises easily    d/t meds  . Cancer ([place])     basal cell ca, in situ- uterine   . Cataracts, bilateral   . Chronic back pain   . Dizziness    r/t to meds  . GERD (gastroesophageal reflux disease)    no meds on a regular basis but will take Tums if needed  . Headache(784.0)    r/t neck issues  . History of bronchitis 6-43yrs ago  . History of colon polyps    benign  . Hyperlipidemia    takes Atorvastatin on Mondays and Fridays  . Hypertension    takes Ramipril daily  . Joint pain   . Joint swelling   . Multiple sclerosis ([place])   . Neuromuscular disorder ([place])    Dr. Park Liter- [facility], follows M.S.  . Nocturia   . OA (osteoarthritis)    Dr Jani Gravel weekly, RA- hands- knees- feet   . PONV (postoperative nausea and vomiting)    trouble urinating after surgery in 2014  . Postoperative nausea and vomiting 01/11/2019  . Rheumatoid arthritis(714.0)    Dr Estanislado Pandy takes [name] daily  . Right wrist fracture   . Skin cancer   . Squamous cell carcinoma of skin 11/02/2001   in situ-right knee (cx58fu)  . Squamous cell carcinoma of skin 11/12/2011   in situ-left forearm (CX35FU), in situ-left foot (CX35FU)  . Squamous cell carcinoma of skin 01/22/2012   in situ-left lower forearm (txpbx)  . Squamous cell carcinoma of skin 11/17/2012   right shin (txpbx)  . Squamous cell carcinoma of skin 10/28/2013   in situ-right shoulder (CX35FU)  . Squamous cell carcinoma of skin 04/28/2014   well diff-right shin (txpbx)  . Squamous cell carcinoma of skin 11/02/2014   in situ-Left shin (txpbx)  . Squamous cell carcinoma of skin 12/15/2014   in situ-Left hand (txpbx), bowens-left side chest (txpbx)  . Squamous cell carcinoma of skin 05/09/2015   in situ-left hand (txpbx)   . Squamous cell carcinoma of skin 05/07/2016   in situ-left inner shin,ant, in situ-left inner shin, post, in situ-left outer forearm, in situ-right knuckle  . Squamous cell carcinoma of skin 03/1302018   in situ-left shoulder (txpbx), in situ-right  inner shin (txpbx), in situ-top of left foot (txpbx), KA- left forearm (txpbx)  . Squamous cell carcinoma of skin 12/02/2016   in situ-left inner shin (txpbx)  . Squamous cell carcinoma of skin 03/04/2017   in situ-left outer sup, shin (txpbx), in situ- right 2nd  knuckle finger (txpbx), in situ- Left wrist (txpbx)  . Squamous cell carcinoma of skin 04/06/2018   in situ-above left knee inner (txpbx)  . Squamous cell carcinoma of skin 04/21/2018   in situ-right top hand (txpbx)  . Squamous cell carcinoma of skin 07/08/2018   in situ-right lower inner shin (txpbx)  . Squamous cell carcinoma of skin 04/27/2019   in situ- Left neck(CX35FU), In situ- right neck (CX35FU)  . Urinary retention    sees Dr.Wrenn about 2 times a yr    PAST SURGICAL HISTORY: Past Surgical History:  Procedure Laterality Date  . ABDOMINAL HYSTERECTOMY     1985  . APPENDECTOMY     with TAH  . BACK SURGERY     several  . BREAST BIOPSY Right    Results were negative  . BROW LIFT Bilateral 10/02/2016   Procedure: BILATERAL LOWER LID BLEPHAROPLASTY;  Surgeon: Clista Bernhardt, MD;  Location: [facility];  Service: Plastics;  Laterality: Bilateral;  . CARPAL TUNNEL RELEASE Right   . cataracts    . CERVICAL FUSION     Dr Joya Salm  . CERVICAL FUSION  12/31/2012   Dr Joya Salm  . CHEST TUBE INSERTION     for traumatic Pneumothorax  . COLONOSCOPY  07/16/2010   normal   . COLONOSCOPY W/ POLYPECTOMY  1997   negative since; Dr Deatra Ina  . ESOPHAGOGASTRODUODENOSCOPY  07/16/2010   normal  . eye lid raise    . EYE SURGERY Bilateral    cataracts removed - /w IOL  . JOINT REPLACEMENT    . LUMBAR FUSION     Dr Joya Salm  . NASAL SINUS SURGERY    . POSTERIOR CERVICAL FUSION/FORAMINOTOMY N/A 12/31/2012   Procedure: POSTERIOR LATERAL CERVICAL FUSION/FORAMINOTOMY LEVEL 1 CERVICAL THREE-FOUR WITH LATERAL MASS SCREWS;  Surgeon: Floyce Stakes, MD;  Location: [facility];  Service: Neurosurgery;  Laterality: N/A;  . PTOSIS REPAIR  Bilateral 10/02/2016   Procedure: INTERNAL PTOSIS REPAIR;  Surgeon: Clista Bernhardt, MD;  Location: [facility];  Service: Plastics;  Laterality: Bilateral;  . SKIN BIOPSY    . THYROID SURGERY     R lobe removed- 1972, has grown back - CT last done- 2018  . TOTAL HIP ARTHROPLASTY  12/22/2011   Procedure: TOTAL HIP ARTHROPLASTY;  Surgeon: Kerin Salen, MD;  Location: [facility];  Service: Orthopedics;  Laterality: Left;  . TOTAL SHOULDER ARTHROPLASTY    . TUBAL LIGATION      FAMILY HISTORY: Family History  Problem Relation Age of Onset  . Cancer Mother        pancreatic  . Diabetes Mother   . Heart disease Father        Rheumatic  . Cancer Father        ? stomach  . Cancer Sister        stomach  . Cancer Maternal Aunt        X 4; ? primary  . Heart disease Paternal Aunt   . Cancer Maternal Grandmother        cervical  . Colon cancer Other        Aunts  . Multiple sclerosis Daughter   . Hypertension Neg Hx   . Stroke Neg Hx     SOCIAL HISTORY:  Social History   Socioeconomic History  . Marital status: Married    Spouse name: Not on file  . Number of children: Not on file  . Years of education: Not on file  . Highest  education level: Not on file  Occupational History  . Occupation: retired  Tobacco Use  . Smoking status: Former Smoker    Packs/day: 2.50    Years: 20.00    Pack years: 50.00    Types: Cigarettes    Quit date: 05/26/1981    Years since quitting: 38.7  . Smokeless tobacco: Never Used  . Tobacco comment: Smoked [phone], up to 2.5 ppd  Vaping Use  . Vaping Use: Never used  Substance and Sexual Activity  . Alcohol use: Yes    Alcohol/week: 14.0 standard drinks    Types: 14 Cans of beer per week    Comment: 2% beer  . Drug use: Never  . Sexual activity: Yes  Other Topics Concern  . Not on file  Social History Narrative  . Not on file   Social Determinants of Health   Financial Resource Strain: Low Risk   . Difficulty of Paying Living Expenses:  Not very hard  Food Insecurity:   . Worried About Charity fundraiser in the Last Year: Not on file  . Ran Out of Food in the Last Year: Not on file  Transportation Needs:   . Lack of Transportation (Medical): Not on file  . Lack of Transportation (Non-Medical): Not on file  Physical Activity:   . Days of Exercise per Week: Not on file  . Minutes of Exercise per Session: Not on file  Stress:   . Feeling of Stress : Not on file  Social Connections:   . Frequency of Communication with Friends and Family: Not on file  . Frequency of Social Gatherings with Friends and Family: Not on file  . Attends Religious Services: Not on file  . Active Member of Clubs or Organizations: Not on file  . Attends Archivist Meetings: Not on file  . Marital Status: Not on file  Intimate Partner Violence:   . Fear of Current or Ex-Partner: Not on file  . Emotionally Abused: Not on file  . Physically Abused: Not on file  . Sexually Abused: Not on file     PHYSICAL EXAM  Vitals:   02/02/20 1429  BP: 126/69  Pulse: 71  Weight: 117 lb (53.1 kg)  Height: 5\' 4"  (1.626 m)    Body mass index is 20.08 kg/m.   General: The patient is well-developed and well-nourished and in no acute distress  Musculoskeletal:   He has some piriformis tenderness on the right.  There is now only mild tenderness over the MCP and CMC joints at the thumb base on the right.     Skin: Extremities are without rash or edema.  Neurologic Exam  Mental status: The patient is alert and oriented x 3 at the time of the examination. The patient has apparent normal recent and remote memory, with an apparently normal attention span and concentration ability.   Speech is normal.  CN:  EOMI, Intact facial sensation..  Facial strength is normal.  Trapezius strength is normal..  Palatal elevation is normal and Tongue protrusion midline.    No obvious hearing deficits are noted.  Motor:  Right biceps bulges (she has a history  of rupture).   Tone is mildly increased in her legs, left > right. Strength appears 5 / 5 in all 4 extremities except 4+/5 ulnar innervated intrinsic hand muscles on the right.     Sensory: Sensory testing is intact to touch and vibration in the arms and legs.  Coordination: Cerebellar testing reveals good rapid  alternating movements in hands . Slightly reduced right heel to shin     Gait and station: Station is stable.  Gait is arthritic and mildly wide.    Tandem gait is wide.  Romberg is negative   Reflexes: Deep tendon reflexes are normal and symmetric.    DIAGNOSTIC DATA (LABS, IMAGING, TESTING) - I reviewed patient records, labs, notes, testing and imaging myself where available.  Lab Results  Component Value Date   WBC 5.6 12/28/2019   HGB 12.5 12/28/2019   HCT 35.7 12/28/2019   MCV 102.0 (H) 12/28/2019   PLT 376 12/28/2019      Component Value Date/Time   NA 137 12/28/2019 1451   NA 139 06/20/2019 1335   NA 136 02/22/2015 0000   K 4.4 12/28/2019 1451   K 4.4 02/22/2015 0000   CL 99 12/28/2019 1451   CL 98 02/22/2015 0000   CO2 30 12/28/2019 1451   CO2 30 02/22/2015 0000   GLUCOSE 104 (H) 12/28/2019 1451   BUN 11 12/28/2019 1451   BUN 10 06/20/2019 1335   CREATININE 0.82 12/28/2019 1451   CALCIUM 9.5 12/28/2019 1451   CALCIUM 9.6 02/22/2015 0000   PROT 6.7 12/28/2019 1451   PROT 6.4 06/20/2019 1335   PROT 6.6 02/22/2015 0000   ALBUMIN 3.5 10/30/2019 1211   ALBUMIN 4.2 06/20/2019 1335   ALBUMIN 4.3 02/22/2015 0000   AST 27 12/28/2019 1451   AST 24 02/22/2015 0000   ALT 19 12/28/2019 1451   ALT 18 02/22/2015 0000   ALKPHOS 40 10/30/2019 1211   ALKPHOS 34 02/22/2015 0000   BILITOT 0.4 12/28/2019 1451   BILITOT 0.3 06/20/2019 1335   GFRNONAA 69 12/28/2019 1451   GFRAA 80 12/28/2019 1451   Lab Results  Component Value Date   CHOL 188 08/05/2018   HDL 72 08/05/2018   LDLCALC 96 08/05/2018   LDLDIRECT 122.9 11/15/2012   TRIG 103 08/05/2018   CHOLHDL 2.6  08/05/2018         ASSESSMENT AND PLAN  Multiple sclerosis (HCC)  Rheumatoid arthritis involving multiple sites with positive rheumatoid factor (HCC)  Other fatigue  S/P cervical spinal fusion  Gait disturbance  Restless leg   1.   She will continue off of DMTs for MS. 2.   I can refill her oxycodone once a day but we will try a lower dose.  If pain worsens would want her to see a pain management practice.  Continue tramadol bid 3.   Continue other medications.  If RLS worsens, consider ropinirole or pramipexole 4.    rtc 4 months sooner if problems   Zuri Bradway A. Felecia Shelling, MD, PhD 10/01/1636, 4:66 PM Certified in Neurology, Clinical Neurophysiology, Sleep Medicine, Pain Medicine and Neuroimaging  [facility] [address], [place], [place] [address] [phone]

## 2020-02-03 ENCOUNTER — Other Ambulatory Visit: Payer: Self-pay | Admitting: Internal Medicine

## 2020-02-09 ENCOUNTER — Other Ambulatory Visit: Payer: Self-pay | Admitting: Rheumatology

## 2020-02-09 NOTE — Telephone Encounter (Signed)
Last Visit: 12/28/2019 Next Visit: 02/29/2020  Current Dose per office note on 12/28/2019: prednisone 5 mg p.o. daily.  Okay to refill per Dr. Estanislado Pandy

## 2020-02-15 NOTE — Progress Notes (Signed)
Office Visit Note  Patient: Sherri [name]             Date of Birth: February 21, 1941           MRN: [id number]             PCP: Binnie Rail, MD Referring: Binnie Rail, MD Visit Date: 02/29/2020 Occupation: @GUAROCC @  Subjective:  Rheumatoid Arthritis (Improving)   History of Present Illness: Sherri [name] is a 79 y.o. female with history of severe rheumatoid arthritis and osteoarthritis.  She was placed on [name] at the last visit.  She was not doing well on [place].  She states her symptoms are gradually improving since she has been on [name].  She is currently on combination of [name], methotrexate and prednisone 5 mg p.o. daily.  She is unable to taper prednisone.  She states she experiences discomfort in the morning and in the evening but during the daytime she is doing much better.  She has been more active.  She also received her COVID-19 booster.  Activities of Daily Living:  Patient reports morning stiffness for 5 hours.   Patient Reports nocturnal pain.  Difficulty dressing/grooming: Denies Difficulty climbing stairs: Reports Difficulty getting out of chair: Denies Difficulty using hands for taps, buttons, cutlery, and/or writing: Reports  Review of Systems  Constitutional: Positive for fatigue.  HENT: Positive for mouth dryness.   Eyes: Positive for dryness.  Respiratory: Negative for shortness of breath.   Cardiovascular: Positive for swelling in legs/feet.  Gastrointestinal: Negative for constipation.  Endocrine: Negative for increased urination.  Genitourinary: Negative for difficulty urinating.  Musculoskeletal: Positive for arthralgias, joint pain, joint swelling, muscle weakness, morning stiffness and muscle tenderness.  Skin: Negative for rash.  Allergic/Immunologic: Positive for susceptible to infections.  Neurological: Positive for numbness and weakness.  Hematological: Positive for bruising/bleeding tendency.  Psychiatric/Behavioral: Positive for  sleep disturbance.    PMFS History:  Patient Active Problem List   Diagnosis Date Noted  . Restless leg 02/02/2020  . Bilateral leg edema 01/11/2020  . Cellulitis of right upper extremity 10/26/2019  . Cervical radiculopathy 06/28/2019  . Carpal tunnel syndrome on both sides 06/28/2019  . Bilateral cataracts 01/11/2019  . Dizziness 01/11/2019  . Postoperative nausea and vomiting 01/11/2019  . Right sided sciatica 04/14/2018  . Trochanteric bursitis of right hip 04/14/2018  . S/P cervical spinal fusion 11/10/2017  . Dysphagia 10/26/2017  . Gastroesophageal reflux disease 08/06/2017  . Easy bruising 08/06/2017  . Anxiety 04/24/2017  . Mid back pain 03/26/2017  . Dyspnea on exertion 03/14/2017  . Hyperkalemia 03/13/2017  . Submandibular gland swelling 01/08/2017  . High risk medication use 09/02/2016  . Piriformis muscle pain 09/02/2016  . Right hip pain 04/08/2016  . Right knee pain 02/25/2016  . Dyspepsia 04/05/2015  . Numbness 11/14/2014  . Biceps tendon tear 10/10/2014  . Insomnia 10/10/2014  . Gait disturbance 06/13/2014  . 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 OP (osteoporosis) 01/18/2014  . Other long term (current) drug therapy 01/18/2014  . Other dysphagia 12/29/2013  . Hoarseness 12/29/2013  . 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 Personal history of colonic polyps 07/03/2010  . Nocturia 05/28/2010  . THYROID NODULE 05/17/2008  . Multiple sclerosis ([place]) 05/17/2008  . Other fatigue 03/30/2007  . Hyperlipidemia 08/20/2006  . Essential hypertension 08/20/2006  . Rheumatoid arthritis ([place]) 08/20/2006  . CERVICAL CANCER, HX OF 08/20/2006  . GASTRIC ULCER, HX OF 08/20/2006    Past Medical History:  Diagnosis Date  . Anxiety    takes Valium daily as needed  . Basal cell carcinoma 01/21/1988   left nostril (MOHS), sup-right calf (CX35FU)  . Basal cell carcinoma 03/22/1996   right calf (CX35FU)  . Basal cell carcinoma 03/31/1995   left wing  nose  . Bruises easily    d/t meds  . Cancer ([place])    basal cell ca, in situ- uterine   . Cataracts, bilateral   . Chronic back pain   . Dizziness    r/t to meds  . GERD (gastroesophageal reflux disease)    no meds on a regular basis but will take Tums if needed  . Headache(784.0)    r/t neck issues  . History of bronchitis 6-46yrs ago  . History of colon polyps    benign  . Hyperlipidemia    takes Atorvastatin on Mondays and Fridays  . Hypertension    takes Ramipril daily  . Joint pain   . Joint swelling   . Multiple sclerosis ([place])   . Neuromuscular disorder ([place])    Dr. Park Liter- [facility], follows [facility]  . Nocturia   . OA (osteoarthritis)    Dr Jani Gravel weekly, RA- hands- knees- feet   . PONV (postoperative nausea and vomiting)    trouble urinating after surgery in 2014  . Postoperative nausea and vomiting 01/11/2019  . Rheumatoid arthritis(714.0)    Dr Estanislado Pandy takes [name] daily  . Right wrist fracture   . Skin cancer   . Squamous cell carcinoma of skin 11/02/2001   in situ-right knee (cx64fu)  . Squamous cell carcinoma of skin 11/12/2011   in situ-left forearm (CX35FU), in situ-left foot (CX35FU)  . Squamous cell carcinoma of skin 01/22/2012   in situ-left lower forearm (txpbx)  . Squamous cell carcinoma of skin 11/17/2012   right shin (txpbx)  . Squamous cell carcinoma of skin 10/28/2013   in situ-right shoulder (CX35FU)  . Squamous cell carcinoma of skin 04/28/2014   well diff-right shin (txpbx)  . Squamous cell carcinoma of skin 11/02/2014   in situ-Left shin (txpbx)  . Squamous cell carcinoma of skin 12/15/2014   in situ-Left hand (txpbx), bowens-left side chest (txpbx)  . Squamous cell carcinoma of skin 05/09/2015   in situ-left hand (txpbx)   . Squamous cell carcinoma of skin 05/07/2016   in situ-left inner shin,ant, in situ-left inner shin, post, in situ-left outer forearm, in situ-right knuckle  . Squamous cell carcinoma of skin  03/1302018   in situ-left shoulder (txpbx), in situ-right inner shin (txpbx), in situ-top of left foot (txpbx), KA- left forearm (txpbx)  . Squamous cell carcinoma of skin 12/02/2016   in situ-left inner shin (txpbx)  . Squamous cell carcinoma of skin 03/04/2017   in situ-left outer sup, shin (txpbx), in situ- right 2nd knuckle finger (txpbx), in situ- Left wrist (txpbx)  . Squamous cell carcinoma of skin 04/06/2018   in situ-above left knee inner (txpbx)  . Squamous cell carcinoma of skin 04/21/2018   in situ-right top hand (txpbx)  . Squamous cell carcinoma of skin 07/08/2018   in situ-right lower inner shin (txpbx)  . Squamous cell carcinoma of skin 04/27/2019   in situ- Left neck(CX35FU), In situ- right neck (CX35FU)  . Urinary retention    sees Dr.Wrenn about 2 times a yr    Family History  Problem Relation Age of Onset  . Cancer Mother        pancreatic  . Diabetes Mother   .  Heart disease Father        Rheumatic  . Cancer Father        ? stomach  . Cancer Sister        stomach  . Cancer Maternal Aunt        X 4; ? primary  . Heart disease Paternal Aunt   . Cancer Maternal Grandmother        cervical  . Colon cancer Other        Aunts  . Multiple sclerosis Daughter   . Hypertension Neg Hx   . Stroke Neg Hx    Past Surgical History:  Procedure Laterality Date  . ABDOMINAL HYSTERECTOMY     1985  . APPENDECTOMY     with TAH  . BACK SURGERY     several  . BREAST BIOPSY Right    Results were negative  . BROW LIFT Bilateral 10/02/2016   Procedure: BILATERAL LOWER LID BLEPHAROPLASTY;  Surgeon: Clista Bernhardt, MD;  Location: [place];  Service: Plastics;  Laterality: Bilateral;  . CARPAL TUNNEL RELEASE Right   . cataracts    . CERVICAL FUSION     Dr Joya Salm  . CERVICAL FUSION  12/31/2012   Dr Joya Salm  . CHEST TUBE INSERTION     for traumatic Pneumothorax  . COLONOSCOPY  07/16/2010   normal   . COLONOSCOPY W/ POLYPECTOMY  1997   negative since; Dr Deatra Ina  .  ESOPHAGOGASTRODUODENOSCOPY  07/16/2010   normal  . eye lid raise    . EYE SURGERY Bilateral    cataracts removed - /w IOL  . JOINT REPLACEMENT    . LUMBAR FUSION     Dr Joya Salm  . NASAL SINUS SURGERY    . POSTERIOR CERVICAL FUSION/FORAMINOTOMY N/A 12/31/2012   Procedure: POSTERIOR LATERAL CERVICAL FUSION/FORAMINOTOMY LEVEL 1 CERVICAL THREE-FOUR WITH LATERAL MASS SCREWS;  Surgeon: Floyce Stakes, MD;  Location: [facility];  Service: Neurosurgery;  Laterality: N/A;  . PTOSIS REPAIR Bilateral 10/02/2016   Procedure: INTERNAL PTOSIS REPAIR;  Surgeon: Clista Bernhardt, MD;  Location: [place];  Service: Plastics;  Laterality: Bilateral;  . SKIN BIOPSY    . THYROID SURGERY     R lobe removed- 1972, has grown back - CT last done- 2018  . TOTAL HIP ARTHROPLASTY  12/22/2011   Procedure: TOTAL HIP ARTHROPLASTY;  Surgeon: Kerin Salen, MD;  Location: [place];  Service: Orthopedics;  Laterality: Left;  . TOTAL SHOULDER ARTHROPLASTY    . TUBAL LIGATION     Social History   Social History Narrative  . Not on file   Immunization History  Administered Date(s) Administered  . Fluad Quad(high Dose 65+) 02/02/2020  . Influenza Whole 02/22/2007, 02/08/2013  . Influenza, High Dose Seasonal PF 02/04/2015, 02/18/2017, 02/21/2018, 01/16/2019  . Influenza-Unspecified 01/24/2014, 01/25/2016, 02/18/2017, 01/16/2019  . PFIZER SARS-COV-2 Vaccination 06/18/2019, 07/09/2019, 01/09/2020  . Pneumococcal Conjugate-13 11/23/2012, 09/08/2014  . Pneumococcal Polysaccharide-23 05/26/2006  . Pneumococcal-Unspecified 12/28/2012  . Td 05/28/2010  . Zoster 05/26/2009  . Zoster Recombinat (Shingrix) 02/10/2019, 04/14/2019     Objective: Vital Signs: BP 133/78 (BP Location: Left Arm, Patient Position: Sitting, Cuff Size: Normal)   Pulse 68   Resp 18   Ht 5\' 2"  (1.575 m)   Wt 118 lb 9.6 oz (53.8 kg)   BMI 21.69 kg/m    Physical Exam Vitals and nursing note reviewed.  Constitutional:      Appearance: She is  well-developed.  HENT:     Head:  Normocephalic and atraumatic.  Eyes:     Conjunctiva/sclera: Conjunctivae normal.  Cardiovascular:     Rate and Rhythm: Normal rate and regular rhythm.     Heart sounds: Normal heart sounds.  Pulmonary:     Effort: Pulmonary effort is normal.     Breath sounds: Normal breath sounds.  Abdominal:     General: Bowel sounds are normal.     Palpations: Abdomen is soft.  Musculoskeletal:     Cervical back: Normal range of motion.  Lymphadenopathy:     Cervical: No cervical adenopathy.  Skin:    General: Skin is warm and dry.     Capillary Refill: Capillary refill takes less than 2 seconds.  Neurological:     Mental Status: She is alert and oriented to person, place, and time.  Psychiatric:        Behavior: Behavior normal.      Musculoskeletal Exam: C-spine was in good range of motion.  Shoulder joints, elbow joints, wrist joints with good range of motion with no synovitis.  She has synovial thickening over MCPs without synovitis.  PIP and DIP thickening was noted.  She has limited range of motion of her hip joints.  Left hip joint is replaced.  Knee joints in good range of motion with no synovitis.  She had no tenderness over ankles or MTPs.  CDAI Exam: CDAI Score: 0.4  Patient Global: 2 mm; Provider Global: 2 mm Swollen: 0 ; Tender: 0  Joint Exam 02/29/2020   No joint exam has been documented for this visit   There is currently no information documented on the homunculus. Go to the Rheumatology activity and complete the homunculus joint exam.  Investigation: No additional findings.  Imaging: No results found.  Recent Labs: Lab Results  Component Value Date   WBC 5.6 12/28/2019   HGB 12.5 12/28/2019   PLT 376 12/28/2019   NA 137 12/28/2019   K 4.4 12/28/2019   CL 99 12/28/2019   CO2 30 12/28/2019   GLUCOSE 104 (H) 12/28/2019   BUN 11 12/28/2019   CREATININE 0.82 12/28/2019   BILITOT 0.4 12/28/2019   ALKPHOS 40 10/30/2019   AST  27 12/28/2019   ALT 19 12/28/2019   PROT 6.7 12/28/2019   ALBUMIN 3.5 10/30/2019   CALCIUM 9.5 12/28/2019   GFRAA 80 12/28/2019   QFTBGOLDPLUS NEGATIVE 10/04/2019    Speciality Comments: Prior therapy includes: Health and safety inspector (cost), [name] (cost)   Procedures:  No procedures performed Allergies: Darvon [propoxyphene hcl], Meperidine hcl, Penicillins, Azathioprine, Atorvastatin, Demerol [meperidine], Leflunomide, and Ezetimibe-simvastatin   Assessment / Plan:     Visit Diagnoses: Rheumatoid arthritis involving multiple sites with positive rheumatoid factor (HCC)-she is doing much better since she has a started [name].  She is on combination of [name], methotrexate and prednisone now.  She does not want to taper prednisone.  We will observe response to the combination therapy over time.  Hopefully in the future she will be able to taper prednisone.  High risk medication use - [name] 15mg  po qd, MTX 6 tablets p.o. weekly, folic acid 1 mg p.o. daily, prednisone 5 mg p.o. daily. (inadequate response to olumiant) - Plan: CBC with Differential/Platelet, COMPLETE METABOLIC PANEL WITH GFR today and then every 3 months to monitor for drug toxicity.  Black box warning on the medication was reviewed with the patient.  Have advised her to take baby aspirin 81 mg p.o. daily if she travels.  On a daily basis need for regular exercise and increase  fluid intake was discussed.  Increased risk of intestinal perforation was also discussed.  Primary osteoarthritis of right hip-she has limited range of motion.  History of total hip replacement, left - Dr. Mayer Camel.  Doing well  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-she had no discomfort with range of motion.  DDD (degenerative disc disease), lumbar-doing better.  Age-related osteoporosis without current pathological fracture - Last Prolia 12/08/2019.  DEXA January 11, 2018 showed a T score of -3.9.  She had DEXA through her PCP.  According to the she was told that  her T score was stable..    Closed fracture of right wrist with routine healing, subsequent encounter  Other fatigue-improved.  History of depression-improved since she is doing better.  Primary insomnia  History of multiple sclerosis ([place])  History of hyperlipidemia  History of hypertension  History of anxiety  Educated about COVID-19 virus infection-she is fully vaccinated against COVID-19.  She also received her booster.  She had instructions on delaying [name] and methotrexate.  Use of monoclonal antibody infusion was discussed in case she develops COVID-19 infection.  Use of mask, social distancing and hand hygiene was discussed.  Orders: Orders Placed This Encounter  Procedures  . CBC with Differential/Platelet  . COMPLETE METABOLIC PANEL WITH GFR   No orders of the defined types were placed in this encounter.   Follow-Up Instructions: Return in about 3 months (around 05/31/2020) for Rheumatoid arthritis.   Bo Merino, MD  Note - This record has been created using Editor, commissioning.  Chart creation errors have been sought, but may not always  have been located. Such creation errors do not reflect on  the standard of medical care.

## 2020-02-21 MED FILL — RINVOQ 15 MG TB24: 15 | 30 days supply | Qty: 30 | Fill #2

## 2020-02-22 DIAGNOSIS — R8271 Bacteriuria: Secondary | ICD-10-CM | POA: Diagnosis not present

## 2020-02-22 DIAGNOSIS — R3914 Feeling of incomplete bladder emptying: Secondary | ICD-10-CM | POA: Diagnosis not present

## 2020-02-22 DIAGNOSIS — N3010 Interstitial cystitis (chronic) without hematuria: Secondary | ICD-10-CM | POA: Diagnosis not present

## 2020-02-29 ENCOUNTER — Ambulatory Visit (INDEPENDENT_AMBULATORY_CARE_PROVIDER_SITE_OTHER): Payer: Medicare Other | Admitting: Rheumatology

## 2020-02-29 ENCOUNTER — Other Ambulatory Visit: Payer: Self-pay

## 2020-02-29 ENCOUNTER — Other Ambulatory Visit: Payer: Self-pay | Admitting: Rheumatology

## 2020-02-29 ENCOUNTER — Encounter: Payer: Self-pay | Admitting: Rheumatology

## 2020-02-29 VITALS — BP 133/78 | HR 68 | Resp 18 | Ht 62.0 in | Wt 118.6 lb

## 2020-02-29 DIAGNOSIS — Z8639 Personal history of other endocrine, nutritional and metabolic disease: Secondary | ICD-10-CM

## 2020-02-29 DIAGNOSIS — Z79899 Other long term (current) drug therapy: Secondary | ICD-10-CM | POA: Diagnosis not present

## 2020-02-29 DIAGNOSIS — M5136 Other intervertebral disc degeneration, lumbar region: Secondary | ICD-10-CM

## 2020-02-29 DIAGNOSIS — M4312 Spondylolisthesis, cervical region: Secondary | ICD-10-CM

## 2020-02-29 DIAGNOSIS — Z7189 Other specified counseling: Secondary | ICD-10-CM

## 2020-02-29 DIAGNOSIS — M1611 Unilateral primary osteoarthritis, right hip: Secondary | ICD-10-CM

## 2020-02-29 DIAGNOSIS — Z8659 Personal history of other mental and behavioral disorders: Secondary | ICD-10-CM

## 2020-02-29 DIAGNOSIS — S62101D Fracture of unspecified carpal bone, right wrist, subsequent encounter for fracture with routine healing: Secondary | ICD-10-CM

## 2020-02-29 DIAGNOSIS — Z96642 Presence of left artificial hip joint: Secondary | ICD-10-CM

## 2020-02-29 DIAGNOSIS — R5383 Other fatigue: Secondary | ICD-10-CM

## 2020-02-29 DIAGNOSIS — M0579 Rheumatoid arthritis with rheumatoid factor of multiple sites without organ or systems involvement: Secondary | ICD-10-CM | POA: Diagnosis not present

## 2020-02-29 DIAGNOSIS — Z8679 Personal history of other diseases of the circulatory system: Secondary | ICD-10-CM

## 2020-02-29 DIAGNOSIS — G35 Multiple sclerosis: Secondary | ICD-10-CM

## 2020-02-29 DIAGNOSIS — M810 Age-related osteoporosis without current pathological fracture: Secondary | ICD-10-CM

## 2020-02-29 DIAGNOSIS — F5101 Primary insomnia: Secondary | ICD-10-CM

## 2020-02-29 NOTE — Patient Instructions (Addendum)
COVID-19 vaccine recommendations:   COVID-19 vaccine is recommended for everyone (unless you are allergic to a vaccine component), even if you are on a medication that suppresses your immune system.   If you are on Methotrexate, Cellcept (mycophenolate), [name], [name], and Olumiant- hold the medication for 1 week after each vaccine. Hold Methotrexate for 2 weeks after the single dose COVID-19 vaccine.   Do not take Tylenol or any anti-inflammatory medications (NSAIDs) 24 hours prior to the COVID-19 vaccination.   There is no direct evidence about the efficacy of the COVID-19 vaccine in individuals who are on medications that suppress the immune system.   Even if you are fully vaccinated, and you are on any medications that suppress your immune system, please continue to wear a mask, maintain at least six feet social distance and practice hand hygiene.   If you develop a COVID-19 infection, please contact your PCP or our office to determine if you need antibody infusion.  The booster vaccine is now available for immunocompromised patients. It is advised that if you had Pfizer vaccine you should get [employer] booster.  If you had a Moderna vaccine then you should get a Moderna booster. Johnson and Wynetta Emery does not have a booster vaccine at this time.  Please see the following web sites for updated information.   [email or web address]://[email or web address]/[email or web address]-Patient-[email or web address]  Standing Labs We placed an order today for your standing lab work.   Please have your standing labs drawn in January and every 3 months  If possible, please have your labs drawn 2 weeks prior to your appointment so that the provider can discuss your results at your appointment.  We have open lab daily Monday through Thursday from 8:30-12:30 PM and 1:30-4:30 PM and Friday from 8:30-12:30 PM and 1:30-4:00 PM at the office of Dr. Bo Merino, [facility].   Please be advised,  patients with office appointments requiring lab work will take precedents over walk-in lab work.  If possible, please come for your lab work on Monday and Friday afternoons, as you may experience shorter wait times. The office is located at [address] [place], [place], [place] [address] No appointment is necessary.   Labs are drawn by [facility]. Please bring your co-pay at the time of your lab draw.  You may receive a bill from [facility] for your lab work.  If you wish to have your labs drawn at another location, please call the office 24 hours in advance to send orders.  If you have any questions regarding directions or hours of operation,  please call [phone].   As a reminder, please drink plenty of water prior to coming for your lab work. Thanks!

## 2020-02-29 NOTE — Telephone Encounter (Signed)
Last Visit: 12/28/2019 Next Visit: 02/29/2020 Labs: 12/28/2019 Lymphocyte count is low. We will continue to monitor labs. CMP is a stable.   Current Dose per office note on 12/28/2019: MTX 6 tablets p.o. weekly Dx:  Rheumatoid arthritis involving multiple sites with positive rheumatoid factor   Okay to refill MTX?

## 2020-03-01 LAB — CBC WITH DIFFERENTIAL/PLATELET
Absolute Monocytes: 555 cells/uL (ref 200–950)
Basophils Absolute: 30 cells/uL (ref 0–200)
Basophils Relative: 0.6 %
Eosinophils Absolute: 10 cells/uL — ABNORMAL LOW (ref 15–500)
Eosinophils Relative: 0.2 %
HCT: 37.7 % (ref 35.0–45.0)
Hemoglobin: 13.3 g/dL (ref 11.7–15.5)
Lymphs Abs: 1135 cells/uL (ref 850–3900)
MCH: 36.3 pg — ABNORMAL HIGH (ref 27.0–33.0)
MCHC: 35.3 g/dL (ref 32.0–36.0)
MCV: 103 fL — ABNORMAL HIGH (ref 80.0–100.0)
MPV: 9.4 fL (ref 7.5–12.5)
Monocytes Relative: 11.1 %
Neutro Abs: 3270 cells/uL (ref 1500–7800)
Neutrophils Relative %: 65.4 %
Platelets: 322 10*3/uL (ref 140–400)
RBC: 3.66 10*6/uL — ABNORMAL LOW (ref 3.80–5.10)
RDW: 13.9 % (ref 11.0–15.0)
Total Lymphocyte: 22.7 %
WBC: 5 10*3/uL (ref 3.8–10.8)

## 2020-03-01 LAB — COMPLETE METABOLIC PANEL WITH GFR
AG Ratio: 2 (calc) (ref 1.0–2.5)
ALT: 22 U/L (ref 6–29)
AST: 30 U/L (ref 10–35)
Albumin: 4.1 g/dL (ref 3.6–5.1)
Alkaline phosphatase (APISO): 30 U/L — ABNORMAL LOW (ref 37–153)
BUN/Creatinine Ratio: 12 (calc) (ref 6–22)
BUN: 11 mg/dL (ref 7–25)
CO2: 28 mmol/L (ref 20–32)
Calcium: 10 mg/dL (ref 8.6–10.4)
Chloride: 98 mmol/L (ref 98–110)
Creat: 0.94 mg/dL — ABNORMAL HIGH (ref 0.60–0.93)
GFR, Est African American: 67 mL/min/{1.73_m2} (ref >=60)
GFR, Est Non African American: 58 mL/min/{1.73_m2} — ABNORMAL LOW (ref >=60)
Globulin: 2.1 g/dL (calc) (ref 1.9–3.7)
Glucose, Bld: 90 mg/dL (ref 65–99)
Potassium: 4.8 mmol/L (ref 3.5–5.3)
Sodium: 138 mmol/L (ref 135–146)
Total Bilirubin: 0.5 mg/dL (ref 0.2–1.2)
Total Protein: 6.2 g/dL (ref 6.1–8.1)

## 2020-03-01 NOTE — Progress Notes (Signed)
CBC and CMP are stable.  She should continue on folic acid 2 mg p.o. daily due to elevated MCV.

## 2020-03-05 ENCOUNTER — Other Ambulatory Visit: Payer: Self-pay | Admitting: Neurology

## 2020-03-05 MED ORDER — OXYCODONE HCL 5 MG PO TABS
5.0000 mg | ORAL_TABLET | ORAL | 0 refills | Status: DC | PRN
Start: 2020-03-05 — End: 2020-06-27

## 2020-03-05 MED ORDER — DIAZEPAM 5 MG PO TABS
5.0000 mg | ORAL_TABLET | Freq: Every day | ORAL | 5 refills | Status: DC
Start: 2020-03-05 — End: 2020-09-18

## 2020-03-05 NOTE — Telephone Encounter (Signed)
Pt is needing a refill on her oxyCODONE (ROXICODONE) 5 MG immediate release tablet and her diazepam 5mg  and have it called in to the [facility] on [address]

## 2020-03-05 NOTE — Telephone Encounter (Addendum)
Checked drug registry. She last refilled oxycodone 5mg  02/02/20 #30 and oxycodone 10mg  on 01/23/20 #30. Last diazepam refill showing 12/21/18 #30. Previously prescribed 5mg  po qhs #30, 5 refills. Last seen 02/02/20 and next follow up is 06/04/20.

## 2020-03-16 ENCOUNTER — Encounter: Payer: Self-pay | Admitting: Family Medicine

## 2020-03-16 ENCOUNTER — Ambulatory Visit (INDEPENDENT_AMBULATORY_CARE_PROVIDER_SITE_OTHER): Payer: Medicare Other | Admitting: Family Medicine

## 2020-03-16 ENCOUNTER — Other Ambulatory Visit: Payer: Self-pay

## 2020-03-16 DIAGNOSIS — S41101A Unspecified open wound of right upper arm, initial encounter: Secondary | ICD-10-CM | POA: Diagnosis not present

## 2020-03-16 MED ORDER — CLINDAMYCIN HCL 300 MG PO CAPS: 300.0000 mg | ORAL_CAPSULE | Freq: Three times a day (TID) | ORAL | 0 refills | Status: DC

## 2020-03-16 MED ORDER — MUPIROCIN 2 % EX OINT: 1.0000 "application " | TOPICAL_OINTMENT | Freq: Two times a day (BID) | CUTANEOUS | 3 refills | Status: DC

## 2020-03-16 NOTE — Progress Notes (Signed)
Office Visit Note   Patient: Sherri [name]           Date of Birth: 1940-12-18           MRN: [id number] Visit Date: 03/16/2020 Requested by: Binnie Rail, MD [place],  [place] [address] PCP: Binnie Rail, MD  Subjective: Chief Complaint  Patient presents with   Right Forearm - Wound Check    Skin tear on the forearm - noticed it a couple of days ago. She is worried about cellulitis. No redness, warmth and swelling. Arms and legs are covered with bruises.    HPI: She is here with a right forearm wound.  Due to her rheumatology medications, she is susceptible to easy bruising.  Recently a bruise on her right forearm broke open.  She has a bandage on it now.  She has not had any fevers or chills, no pus draining from the wound.  But she has a history of cellulitis and she wanted to be sure it was not getting infected.               ROS:   All other systems were reviewed and are negative.  Objective: Vital Signs: There were no vitals taken for this visit.  Physical Exam:  General:  Alert and oriented, in no acute distress. Pulm:  Breathing unlabored. Psy:  Normal mood, congruent affect. Skin: There is a wound on the dorsal aspect of her forearm, a tear in her skin.  There is no active bleeding or drainage, no cellulitis.   Imaging: No results found.  Assessment & Plan: 1.  Right forearm wound, not yet infected. -Bactroban ointment.  If it becomes infected, then clindamycin.  She will notify me if it worsens.     Procedures: No procedures performed  No notes on file     PMFS History: Patient Active Problem List   Diagnosis Date Noted   Restless leg 02/02/2020   Bilateral leg edema 01/11/2020   Cellulitis of right upper extremity 10/26/2019   Cervical radiculopathy 06/28/2019   Carpal tunnel syndrome on both sides 06/28/2019   Bilateral cataracts 01/11/2019   Dizziness 01/11/2019   Postoperative nausea and vomiting 01/11/2019    Right sided sciatica 04/14/2018   Trochanteric bursitis of right hip 04/14/2018   S/P cervical spinal fusion 11/10/2017   Dysphagia 10/26/2017   Gastroesophageal reflux disease 08/06/2017   Easy bruising 08/06/2017   Anxiety 04/24/2017   Mid back pain 03/26/2017   Dyspnea on exertion 03/14/2017   Hyperkalemia 03/13/2017   Submandibular gland swelling 01/08/2017   High risk medication use 09/02/2016   Piriformis muscle pain 09/02/2016   Right hip pain 04/08/2016   Right knee pain 02/25/2016   Dyspepsia 04/05/2015   Numbness 11/14/2014   Biceps tendon tear 10/10/2014   Insomnia 10/10/2014   Gait disturbance 06/13/2014   Depression with anxiety 06/13/2014   OP (osteoporosis) 01/18/2014   Other long term (current) drug therapy 01/18/2014   Other dysphagia 12/29/2013   Hoarseness 12/29/2013   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 01/01/2013   Personal history of colonic polyps 07/03/2010   Nocturia 05/28/2010   THYROID NODULE 05/17/2008   Multiple sclerosis ([place]) 05/17/2008   Other fatigue 03/30/2007   Hyperlipidemia 08/20/2006   Essential hypertension 08/20/2006   Rheumatoid arthritis ([place]) 08/20/2006   CERVICAL CANCER, HX OF 08/20/2006   GASTRIC ULCER, HX OF 08/20/2006   Past Medical History:  Diagnosis Date   Anxiety  takes Valium daily as needed   Basal cell carcinoma 01/21/1988   left nostril (MOHS), sup-right calf (CX35FU)   Basal cell carcinoma 03/22/1996   right calf (CX35FU)   Basal cell carcinoma 03/31/1995   left wing nose   Bruises easily    d/t meds   Cancer (HCC)    basal cell ca, in situ- uterine    Cataracts, bilateral    Chronic back pain    Dizziness    r/t to meds   GERD (gastroesophageal reflux disease)    no meds on a regular basis but will take Tums if needed   Headache(784.0)    r/t neck issues   History of bronchitis 6-25yrs ago   History of colon polyps    benign   Hyperlipidemia      takes Atorvastatin on Mondays and Fridays   Hypertension    takes Ramipril daily   Joint pain    Joint swelling    Multiple sclerosis ([place])    Neuromuscular disorder ([place])    Dr. Park Liter- [facility], follows [facility]   Nocturia    OA (osteoarthritis)    Dr Jani Gravel weekly, RA- hands- knees- feet    PONV (postoperative nausea and vomiting)    trouble urinating after surgery in 2014   Postoperative nausea and vomiting 01/11/2019   Rheumatoid arthritis(714.0)    Dr Estanislado Pandy takes [name] daily   Right wrist fracture    Skin cancer    Squamous cell carcinoma of skin 11/02/2001   in situ-right knee (cx36fu)   Squamous cell carcinoma of skin 11/12/2011   in situ-left forearm (CX35FU), in situ-left foot (CX35FU)   Squamous cell carcinoma of skin 01/22/2012   in situ-left lower forearm (txpbx)   Squamous cell carcinoma of skin 11/17/2012   right shin (txpbx)   Squamous cell carcinoma of skin 10/28/2013   in situ-right shoulder (CX35FU)   Squamous cell carcinoma of skin 04/28/2014   well diff-right shin (txpbx)   Squamous cell carcinoma of skin 11/02/2014   in situ-Left shin (txpbx)   Squamous cell carcinoma of skin 12/15/2014   in situ-Left hand (txpbx), bowens-left side chest (txpbx)   Squamous cell carcinoma of skin 05/09/2015   in situ-left hand (txpbx)    Squamous cell carcinoma of skin 05/07/2016   in situ-left inner shin,ant, in situ-left inner shin, post, in situ-left outer forearm, in situ-right knuckle   Squamous cell carcinoma of skin 03/1302018   in situ-left shoulder (txpbx), in situ-right inner shin (txpbx), in situ-top of left foot (txpbx), KA- left forearm (txpbx)   Squamous cell carcinoma of skin 12/02/2016   in situ-left inner shin (txpbx)   Squamous cell carcinoma of skin 03/04/2017   in situ-left outer sup, shin (txpbx), in situ- right 2nd knuckle finger (txpbx), in situ- Left wrist (txpbx)   Squamous cell carcinoma of  skin 04/06/2018   in situ-above left knee inner (txpbx)   Squamous cell carcinoma of skin 04/21/2018   in situ-right top hand (txpbx)   Squamous cell carcinoma of skin 07/08/2018   in situ-right lower inner shin (txpbx)   Squamous cell carcinoma of skin 04/27/2019   in situ- Left neck(CX35FU), In situ- right neck (CX35FU)   Urinary retention    sees Dr.Wrenn about 2 times a yr    Family History  Problem Relation Age of Onset   Cancer Mother        pancreatic   Diabetes Mother    Heart disease Father  Rheumatic   Cancer Father        ? stomach   Cancer Sister        stomach   Cancer Maternal Aunt        X 4; ? primary   Heart disease Paternal Aunt    Cancer Maternal Grandmother        cervical   Colon cancer Other        Aunts   Multiple sclerosis Daughter    Hypertension Neg Hx    Stroke Neg Hx     Past Surgical History:  Procedure Laterality Date   ABDOMINAL HYSTERECTOMY     1985   APPENDECTOMY     with TAH   BACK SURGERY     several   BREAST BIOPSY Right    Results were negative   BROW LIFT Bilateral 10/02/2016   Procedure: BILATERAL LOWER LID BLEPHAROPLASTY;  Surgeon: Clista Bernhardt, MD;  Location: [facility];  Service: Plastics;  Laterality: Bilateral;   CARPAL TUNNEL RELEASE Right    cataracts     CERVICAL FUSION     Dr Joya Salm   CERVICAL FUSION  12/31/2012   Dr Joya Salm   CHEST TUBE INSERTION     for traumatic Pneumothorax   COLONOSCOPY  07/16/2010   normal    COLONOSCOPY W/ POLYPECTOMY  1997   negative since; Dr Deatra Ina   ESOPHAGOGASTRODUODENOSCOPY  07/16/2010   normal   eye lid raise     EYE SURGERY Bilateral    cataracts removed - /w IOL   JOINT REPLACEMENT     LUMBAR FUSION     Dr Joya Salm   NASAL SINUS SURGERY     POSTERIOR CERVICAL FUSION/FORAMINOTOMY N/A 12/31/2012   Procedure: POSTERIOR LATERAL CERVICAL FUSION/FORAMINOTOMY LEVEL 1 CERVICAL THREE-FOUR WITH LATERAL MASS SCREWS;  Surgeon: Floyce Stakes, MD;   Location: [facility];  Service: Neurosurgery;  Laterality: N/A;   PTOSIS REPAIR Bilateral 10/02/2016   Procedure: INTERNAL PTOSIS REPAIR;  Surgeon: Clista Bernhardt, MD;  Location: [facility];  Service: Plastics;  Laterality: Bilateral;   SKIN BIOPSY     THYROID SURGERY     R lobe removed- 1972, has grown back - CT last done- 2018   TOTAL HIP ARTHROPLASTY  12/22/2011   Procedure: TOTAL HIP ARTHROPLASTY;  Surgeon: Kerin Salen, MD;  Location: [facility];  Service: Orthopedics;  Laterality: Left;   TOTAL SHOULDER ARTHROPLASTY     TUBAL LIGATION     Social History   Occupational History   Occupation: retired  Tobacco Use   Smoking status: Former Smoker    Packs/day: 2.50    Years: 20.00    Pack years: 50.00    Types: Cigarettes    Quit date: 05/26/1981    Years since quitting: 38.8   Smokeless tobacco: Never Used   Tobacco comment: Smoked [phone], up to 2.5 ppd  Vaping Use   Vaping Use: Never used  Substance and Sexual Activity   Alcohol use: Yes    Alcohol/week: 14.0 standard drinks    Types: 14 Cans of beer per week    Comment: 2% beer   Drug use: Never   Sexual activity: Yes

## 2020-03-19 ENCOUNTER — Other Ambulatory Visit: Payer: Self-pay | Admitting: Pharmacist

## 2020-03-19 ENCOUNTER — Other Ambulatory Visit: Payer: Self-pay | Admitting: Rheumatology

## 2020-03-19 DIAGNOSIS — M0579 Rheumatoid arthritis with rheumatoid factor of multiple sites without organ or systems involvement: Secondary | ICD-10-CM

## 2020-03-19 NOTE — Telephone Encounter (Signed)
Routing to clinical staff.

## 2020-03-19 NOTE — Telephone Encounter (Signed)
Last Visit: 02/29/2020 Next Visit: 06/27/2020 Labs: 02/29/2020 CBC and CMP are stable TB Gold: 10/04/2019 Neg   Current Dose per office note 02/29/2020: [name] 15mg  po qd  DX: Rheumatoid arthritis involving multiple sites with positive rheumatoid factor   Okay to refill per Dr. Estanislado Pandy

## 2020-03-20 ENCOUNTER — Ambulatory Visit: Payer: Medicare Other | Admitting: Physician Assistant

## 2020-03-21 MED FILL — RINVOQ 15 MG TB24: 15 | 30 days supply | Qty: 30 | Fill #0

## 2020-03-26 ENCOUNTER — Ambulatory Visit: Payer: Medicare Other | Admitting: Family Medicine

## 2020-03-26 ENCOUNTER — Encounter: Payer: Self-pay | Admitting: Family Medicine

## 2020-03-26 ENCOUNTER — Telehealth: Payer: Self-pay | Admitting: Family Medicine

## 2020-03-26 NOTE — Telephone Encounter (Signed)
Patient was contacted regarding today's scheduled visit. Last seen by Dr Felecia Shelling in 01/2020 with 4 month follow up advised. She is doing well. No new or worsening symptoms. She was advised that we will reschedule appt for return in January-February. She verbalizes understanding and will contact us with any questions or concerns.

## 2020-03-29 DIAGNOSIS — K219 Gastro-esophageal reflux disease without esophagitis: Secondary | ICD-10-CM | POA: Diagnosis not present

## 2020-03-29 DIAGNOSIS — H903 Sensorineural hearing loss, bilateral: Secondary | ICD-10-CM | POA: Diagnosis not present

## 2020-03-29 DIAGNOSIS — R490 Dysphonia: Secondary | ICD-10-CM | POA: Diagnosis not present

## 2020-03-29 DIAGNOSIS — D440 Neoplasm of uncertain behavior of thyroid gland: Secondary | ICD-10-CM | POA: Diagnosis not present

## 2020-04-02 ENCOUNTER — Ambulatory Visit: Payer: Medicare Other | Admitting: Physician Assistant

## 2020-04-02 ENCOUNTER — Telehealth: Payer: Self-pay | Admitting: Pharmacist

## 2020-04-02 NOTE — Progress Notes (Signed)
Spoke with patient to remind her that she has an appointment with the clinical pharmacist on 04/04/2020 and also told her that I left a message on Mr. Druck phone as well reminding him of the appointment.

## 2020-04-03 ENCOUNTER — Ambulatory Visit: Payer: Medicare Other | Admitting: Pharmacist

## 2020-04-03 ENCOUNTER — Other Ambulatory Visit: Payer: Self-pay

## 2020-04-03 DIAGNOSIS — E7849 Other hyperlipidemia: Secondary | ICD-10-CM

## 2020-04-03 DIAGNOSIS — M810 Age-related osteoporosis without current pathological fracture: Secondary | ICD-10-CM

## 2020-04-03 DIAGNOSIS — I10 Essential (primary) hypertension: Secondary | ICD-10-CM

## 2020-04-03 NOTE — Chronic Care Management (AMB) (Signed)
Chronic Care Management Pharmacy  Name: Sherri [name]  MRN: [id number] DOB: 31-Oct-1940  Chief Complaint/ HPI  Sherri [name],  79 y.o. , female presents for their Follow-Up CCM visit with the clinical pharmacist via telephone due to COVID-19 Pandemic.  PCP : Binnie Rail, MD  Their chronic conditions include: Hypertension, Hyperlipidemia, GERD, Depression, Anxiety, Osteoporosis and Multiple Sclerosis, Rheumatoid arthritis  Lives with husband, has an 18-yr-old cat on 3 different medications. Born in [place], went to school in [place], worked as Optometrist, [employer] club for 30 years. Not very active currently due to MS and RA. Gets overheated easily with MS. Previously was athletic/active, really misses not being able to do everything she wanted to do.   Office Visits: 01/11/20 Dr Quay Burow OV: no med changes  12/09/19 NP Jodi Mourning OV: pedal edema, rx'd furosemide 20 mg 10/26/19 Dr Quay Burow: cellulitis, rx'd Keflex  03/25/19 Dr Quay Burow OV: swelling of L cheek, ordered CT - no evidence of infection.   04/28/19 Added amlodipine 2.5 mg due to elevated BP. 05/05/19 Stopped amlodipine due to pain more controlled, BP back down.  Consult Visit: 02/02/20 Dr Felecia Shelling (neurology); f/u for MS, increase oxycodone to 10 mg HS + 5 mg every 4h PRN  11/25/19 Dr Junius Roads (ortho): referred to PT for carpal tunnel  10/28/19 Dr Estanislado Pandy (rehuamtology): increased prednisone to 5 mg daily  09/26/19 NP Amy Lomax-Dr Felecia Shelling (neurology): doing well with MS, continues on pain medications. No med changes.  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  09/06/19 PA Persons (ortho surgery): R foot pain - Morton's neuroma s/p webspace injection.  08/29/19 Dr Sharol Given (ortho surgery): L foot pain - neuroma injection.  07/28/19 Dr Estanislado Pandy (rheumatology): failed multiple medications in the past, has reduced prednisone to 6 mg daily (from 9 mg). Pt had best response to [name] but it was  not covered. Discussed adding plaquenil but pt declined. Decided to increase prednisone to 8 mg daily. Plan to reduce back to 6 when arthritis improves.  05/25/19 Dr Felecia Shelling (neurology): started lamotrigine for facial pain, refilled oxycodone.  Allergies  Allergen Reactions  . Darvon [Propoxyphene Hcl] Shortness Of Breath    ? Dose Related ? Lowered respirations greatly  . Meperidine Hcl Shortness Of Breath  . Penicillins Hives and Other (See Comments)    Has patient had a PCN reaction causing immediate rash, facial/tongue/throat swelling, SOB or lightheadedness with hypotension: No SEVERE RASH INVOLVING MUCUS MEMBRANES or SKIN NECROSIS: #  #  #  YES  #  #  #  Has patient had a PCN reaction that required hospitalization No Has patient had a PCN reaction occurring within the last 10 years: No.   . Azathioprine Other (See Comments)    Weakness  . Atorvastatin Nausea And Vomiting  . Demerol [Meperidine]     Other reaction(s): Respiratory Distress  . Leflunomide     unknown  . Ezetimibe-Simvastatin Nausea And Vomiting   Medications: Outpatient Encounter Medications as of 04/03/2020  Medication Sig Note  . atorvastatin (LIPITOR) 10 MG tablet TAKE 1 TAB ON MONDAYS AND FRIDAY   . Biotin 5000 MCG TABS Take 5,000 mcg by mouth daily.    . Calcium Carbonate-Vitamin D (CALCIUM + D PO) Take 1 tablet by mouth daily.    . Carboxymethylcellul-Glycerin (LUBRICATING EYE DROPS OP) Apply 1 drop to eye daily as needed (dry eyes).   . carvedilol (COREG) 6.25 MG tablet TAKE 1 TABLET (6.25 MG TOTAL) BY MOUTH 2 (TWO)  TIMES DAILY WITH A MEAL.   [name] Cholecalciferol (VITAMIN D3) 2000 units capsule Take 2,000 Units by mouth daily.   . ciprofloxacin (CIPRO) 250 MG tablet Take 250 mg by mouth 2 (two) times daily.  03/16/2020: Finished course of therapy 2 days ago (for UTI)  . clindamycin (CLEOCIN) 300 MG capsule Take 1 capsule (300 mg total) by mouth 3 (three) times daily.   . Cyanocobalamin (VITAMIN B-12 PO) Take 1  tablet by mouth daily.   [name] denosumab (PROLIA) 60 MG/ML SOSY injection INJECT 60 MG INTO THE SKIN EVERY 6 (SIX) MONTHS.   [name] diazepam (VALIUM) 5 MG tablet Take 1 tablet (5 mg total) by mouth at bedtime.   . diclofenac sodium (VOLTAREN) 1 % GEL APPLY 3 GRAMS TO 3 LARGE JOINTS 3 TIMES A DAY AS NEEDED   . famotidine (PEPCID) 40 MG tablet TAKE 1 TABLET BY MOUTH EVERY DAY   . folic acid (FOLVITE) 1 MG tablet TAKE 2 TABLETS BY MOUTH EVERY DAY   . furosemide (LASIX) 20 MG tablet Take 1 tablet (20 mg total) by mouth daily.   . methotrexate (RHEUMATREX) 2.5 MG tablet TAKE 6 TABLETS BY MOUTH ONCE WEEKLY. CAUTION:CHEMOTHERAPY. PROTECT FROM LIGHT.   [name] mupirocin ointment (BACTROBAN) 2 % Apply 1 application topically 2 (two) times daily.   [name] oxyCODONE (ROXICODONE) 5 MG immediate release tablet Take 1 tablet (5 mg total) by mouth every 4 (four) hours as needed for severe pain.   . Oxycodone HCl 10 MG TABS Take 1 tablet by mouth at bedtime as needed   . predniSONE (DELTASONE) 5 MG tablet TAKE 1 TABLET BY MOUTH EVERY DAY WITH BREAKFAST   . Pyridoxine HCl (VITAMIN B-6 PO) Take 1 tablet by mouth daily.   [name] [name] 15 MG TB24 TAKE 15 MG (1 TABLET) BY MOUTH DAILY.   . traMADol (ULTRAM) 50 MG tablet Take 1 tablet (50 mg total) by mouth 2 (two) times daily.    No facility-administered encounter medications on file as of 04/03/2020.   Wt Readings from Last 3 Encounters:  02/29/20 118 lb 9.6 oz (53.8 kg)  02/02/20 117 lb (53.1 kg)  01/11/20 118 lb (53.5 kg)   Current Diagnosis/Assessment:   Goals Addressed            This Visit's Progress   . Pharmacy Care Plan       CARE PLAN ENTRY  Current Barriers:  . Chronic Disease Management support, education, and care coordination needs related to Hypertension, Hyperlipidemia, and Osteoporosis   Hypertension BP Readings from Last 3 Encounters:  02/29/20 133/78  02/02/20 126/69  01/11/20 114/72 .  Pharmacist Clinical Goal(s): o Over the next 180 days, patient  will work with PharmD and providers to maintain BP goal <130/80 . Current regimen:  o Carvedilol 6.25 mg twice a day . Interventions: o Discussed BP goal and benefits of medication for prevention of heart attack and stroke . Patient self care activities - Over the next 180 days, patient will: o Check BP 1-2x per week, document, and provide at future appointments o Ensure daily salt intake < 2300 mg/day  Hyperlipidemia Lab Results  Component Value Date/Time   LDLCALC 96 08/05/2018 10:25 AM   LDLDIRECT 122.9 11/15/2012 02:01 PM .  Pharmacist Clinical Goal(s): o Over the next 180 days, patient will work with PharmD and providers to maintain LDL goal < 100 . Current regimen:  o Atorvastatin 10 mg Mon and Fri . Interventions: o Discussed cholesterol goal and benefits of statin  medication for prevention of heart attack and stroke . Patient self care activities - Over the next 180 days, patient will: o Continue medication as prescribed o Continue low cholesterol diet  Osteoporosis . Pharmacist Clinical Goal(s) o Over the next 180 days, patient will work with PharmD and providers to optimize bone health . Current regimen:  o Calcium carbonate 600 mg o Vitamin D 2000 IU daily o Prolia 60 mg q6 months (last given 12/08/19) . Interventions: o Discussed the human body can absorb only 600 mg of calcium at a time; counseled to take calcium twice a day to get 1200 mg total each day o Discussed Prolia will be due again mid January; it may be appropriate to delay 1-2 months until she reaches catastrophic coverage to avoid high copays . Patient self care activities - Over the next 180 days, patient will: o Take calcium 600 mg twice a day (separated by 8-12 hrs) o Discuss delaying next Prolia dose with prescriber  Medication management . Pharmacist Clinical Goal(s): o Over the next 180 days, patient will work with PharmD and providers to maintain optimal medication adherence . Current [facility] . Interventions o Comprehensive medication review performed. o Continue current medication management strategy . Patient self care activities - Over the next 180 days, patient will: o Focus on medication adherence by patient report o Take medications as prescribed o Report any questions or concerns to PharmD and/or provider(s)  Please see past updates related to this goal by clicking on the "Past Updates" button in the selected goal        Hypertension   BP goal is:  <130/80  Office blood pressures are  BP Readings from Last 3 Encounters:  02/29/20 133/78  02/02/20 126/69  01/11/20 114/72   Pulse Readings from Last 3 Encounters:  02/29/20 68  02/02/20 71  01/11/20 82   Lab Results  Component Value Date   CREATININE 0.94 (H) 02/29/2020   BUN 11 02/29/2020   GFR 69.54 03/30/2019   GFRNONAA 58 (L) 02/29/2020   GFRAA 67 02/29/2020   NA 138 02/29/2020   K 4.8 02/29/2020   CALCIUM 10.0 02/29/2020   CO2 28 02/29/2020   Patient checks BP at home 1-2x per week Patient home BP readings are ranging: 106/70 - 120/70, HR ~70  Patient has failed these meds in the past: amlodipine, furosemide, metoprolol, Bystolic, ramipril Patient is currently controlled on the following medications:  . Carvedilol 6.25 mg BID  We discussed diet and exercise extensively; benefits of BP control for CV risk reduction.  Plan  Continue current medications and control with diet and exercise     Hyperlipidemia   LDL goal < 100  Lipid Panel     Component Value Date/Time   CHOL 188 08/05/2018 1025   TRIG 103 08/05/2018 1025   HDL 72 08/05/2018 1025   LDLCALC 96 08/05/2018 1025   LDLDIRECT 122.9 11/15/2012 1401    The 10-year ASCVD risk score Mikey Bussing DC Jr., et al., 2013) is: 31.2%   Values used to calculate the score:     Age: 76 years     Sex: Female     Is Non-Hispanic African American: No     Diabetic: No     Tobacco smoker: No     Systolic Blood Pressure: 810 mmHg     Is BP  treated: Yes     HDL Cholesterol: 72 mg/dL     Total Cholesterol: 188 mg/dL   Patient has failed  these meds in past: n/a Patient is currently controlled on the following medications:  . Atorvastatin 10 mg Mon and Fri  We discussed:  diet and exercise extensively; RA medications contribute to elevated lipids; benefits of statin for ASCVD prevention  Plan  Continue current medications and control with diet and exercise  Osteoporosis   Last DEXA Scan: 01/11/2018  T-Score femoral neck: -3.4  T-Score total hip: -2.4  T-Score lumbar spine: -1.0  T-Score forearm radius: -3.9  10-year probability of major osteoporotic fracture: n/a  10-year probability of hip fracture: n/a  Lab Results  Component Value Date/Time   VD25OH 57 08/31/2017 01:27 PM   CALCIUM 10.0 02/29/2020 01:32 PM   CALCIUM 9.6 02/22/2015 12:00 AM   Patient is a candidate for pharmacologic treatment due to T-Score < -2.5 in femoral neck  Patient has failed these meds in past: alendronate Patient is currently controlled on the following medications:   Calcium carbonate 600 mg   Vitamin D 2000 IU daily  Prolia 60 mg q6 months (last given 12/08/19)   We discussed:  Next Prolia dose will be due mid-January 2022; pt has spoken with insurance agent who informed her than due to [name], she will enter the donut hole with first claim and catastrophic coverage after 2 months; due to this it may be pertinent to delay next Prolia dose until she reaches catastrophic coverage to avoid paying donut-hole copay for Prolia. Pt will discuss with her prescriber  Plan  Continue current medications  Pt to discuss whether to delay next Prolia dose of cost savings with prescriber  Multiple Sclerosis   Patient has failed these meds in past: lamotrigine (never started) Patient is currently controlled on the following medications:  . Oxycodone 5 mg q4h PRN  . Tramadol 50 mg TID prn - 1/2 tab 5pm, 1/2 tab at 8pm  We discussed:  Pt is  worried about oxycodone addiction, she often avoids using unless she is in terrible pain; discussed risk of addiction with taking opioids as prescribed is low, and given her fear of addiction she is very unlikely to become dependent;  Plan  Continue current medications   Rheumatoid arthritis   Patient has failed these meds in past: n/a Patient is currently controlled on the following medications:  [name] Methotrexate 15 mg (2.5 mg x 6) once weekly - Wednesday . Prednisone 5 mg daily . Folic acid 1 mg -  2 tablets daily . Diclofenac 1% gel PRN . [name] 15 mg daily  We discussed:  [name] was approved via Rheumatology office, pt is now paying ~$300 per month and reports it is working very well; she has spoken with insurance agent who told her it will be ~$1700 in January and she will be in donut hole right away, but will reach catastrophic coverage after ~2 months and price will return to $300-500 per month. Pt is trying to plan accordingly to continue using this medication.  Plan  Continue current medications  Continue working with insurance company regarding [name] coverage   Medication Management   [facility] Uses pill box? Yes Pt endorses 100% compliance  We discussed: Pt likes to be in charge of her own refills. [facility] good [facility].  Plan  Continue current medication management strategy    Follow up: 6 month phone visit  Charlene Brooke, PharmD, [facility] Clinical Pharmacist [facility] [phone]

## 2020-04-04 NOTE — Patient Instructions (Signed)
Visit Information  Phone number for Pharmacist: [phone]  Goals Addressed            This Visit's Progress   . Pharmacy Care Plan       CARE PLAN ENTRY  Current Barriers:  . Chronic Disease Management support, education, and care coordination needs related to Hypertension, Hyperlipidemia, and Osteoporosis   Hypertension BP Readings from Last 3 Encounters:  02/29/20 133/78  02/02/20 126/69  01/11/20 114/72 .  Pharmacist Clinical Goal(s): o Over the next 180 days, patient will work with PharmD and providers to maintain BP goal <130/80 . Current regimen:  o Carvedilol 6.25 mg twice a day . Interventions: o Discussed BP goal and benefits of medication for prevention of heart attack and stroke . Patient self care activities - Over the next 180 days, patient will: o Check BP 1-2x per week, document, and provide at future appointments o Ensure daily salt intake < 2300 mg/day  Hyperlipidemia Lab Results  Component Value Date/Time   LDLCALC 96 08/05/2018 10:25 AM   LDLDIRECT 122.9 11/15/2012 02:01 PM .  Pharmacist Clinical Goal(s): o Over the next 180 days, patient will work with PharmD and providers to maintain LDL goal < 100 . Current regimen:  o Atorvastatin 10 mg Mon and Fri . Interventions: o Discussed cholesterol goal and benefits of statin medication for prevention of heart attack and stroke . Patient self care activities - Over the next 180 days, patient will: o Continue medication as prescribed o Continue low cholesterol diet  Osteoporosis . Pharmacist Clinical Goal(s) o Over the next 180 days, patient will work with PharmD and providers to optimize bone health . Current regimen:  o Calcium carbonate 600 mg o Vitamin D 2000 IU daily o Prolia 60 mg q6 months (last given 12/08/19) . Interventions: o Discussed the human body can absorb only 600 mg of calcium at a time; counseled to take calcium twice a day to get 1200 mg total each day o Discussed Prolia will be  due again mid January; it may be appropriate to delay 1-2 months until she reaches catastrophic coverage to avoid high copays . Patient self care activities - Over the next 180 days, patient will: o Take calcium 600 mg twice a day (separated by 8-12 hrs) o Discuss delaying next Prolia dose with prescriber  Medication management . Pharmacist Clinical Goal(s): o Over the next 180 days, patient will work with PharmD and providers to maintain optimal medication adherence . [facility] . Interventions o Comprehensive medication review performed. o Continue current medication management strategy . Patient self care activities - Over the next 180 days, patient will: o Focus on medication adherence by patient report o Take medications as prescribed o Report any questions or concerns to PharmD and/or provider(s)  Please see past updates related to this goal by clicking on the "Past Updates" button in the selected goal       Patient verbalizes understanding of instructions provided today.  Telephone follow up appointment with pharmacy team member scheduled for: 6 months  Charlene Brooke, PharmD, [facility] Clinical Pharmacist [facility] at [facility] [phone]

## 2020-04-05 ENCOUNTER — Telehealth: Payer: Self-pay | Admitting: Pharmacist

## 2020-04-05 NOTE — Progress Notes (Signed)
Chronic Care Management Pharmacy Assistant   Name: NANCYANN COTTERMAN  MRN: [id number] DOB: 09-24-1940  Reason for Encounter: Medication Adherence Call   PCP : Binnie Rail, MD  Allergies:   Allergies  Allergen Reactions  . Darvon [Propoxyphene Hcl] Shortness Of Breath    ? Dose Related ? Lowered respirations greatly  . Meperidine Hcl Shortness Of Breath  . Penicillins Hives and Other (See Comments)    Has patient had a PCN reaction causing immediate rash, facial/tongue/throat swelling, SOB or lightheadedness with hypotension: No SEVERE RASH INVOLVING MUCUS MEMBRANES or SKIN NECROSIS: #  #  #  YES  #  #  #  Has patient had a PCN reaction that required hospitalization No Has patient had a PCN reaction occurring within the last 10 years: No.   . Azathioprine Other (See Comments)    Weakness  . Atorvastatin Nausea And Vomiting  . Demerol [Meperidine]     Other reaction(s): Respiratory Distress  . Leflunomide     unknown  . Ezetimibe-Simvastatin Nausea And Vomiting    Medications: Outpatient Encounter Medications as of 04/05/2020  Medication Sig Note  . atorvastatin (LIPITOR) 10 MG tablet TAKE 1 TAB ON MONDAYS AND FRIDAY   . Biotin 5000 MCG TABS Take 5,000 mcg by mouth daily.    . Calcium Carbonate-Vitamin D (CALCIUM + D PO) Take 1 tablet by mouth daily.    . Carboxymethylcellul-Glycerin (LUBRICATING EYE DROPS OP) Apply 1 drop to eye daily as needed (dry eyes).   . carvedilol (COREG) 6.25 MG tablet TAKE 1 TABLET (6.25 MG TOTAL) BY MOUTH 2 (TWO) TIMES DAILY WITH A MEAL.   [name] Cholecalciferol (VITAMIN D3) 2000 units capsule Take 2,000 Units by mouth daily.   . ciprofloxacin (CIPRO) 250 MG tablet Take 250 mg by mouth 2 (two) times daily.  03/16/2020: Finished course of therapy 2 days ago (for UTI)  . clindamycin (CLEOCIN) 300 MG capsule Take 1 capsule (300 mg total) by mouth 3 (three) times daily.   . Cyanocobalamin (VITAMIN B-12 PO) Take 1 tablet by mouth daily.   [name] denosumab  (PROLIA) 60 MG/ML SOSY injection INJECT 60 MG INTO THE SKIN EVERY 6 (SIX) MONTHS.   [name] diazepam (VALIUM) 5 MG tablet Take 1 tablet (5 mg total) by mouth at bedtime.   . diclofenac sodium (VOLTAREN) 1 % GEL APPLY 3 GRAMS TO 3 LARGE JOINTS 3 TIMES A DAY AS NEEDED   . famotidine (PEPCID) 40 MG tablet TAKE 1 TABLET BY MOUTH EVERY DAY   . folic acid (FOLVITE) 1 MG tablet TAKE 2 TABLETS BY MOUTH EVERY DAY   . furosemide (LASIX) 20 MG tablet Take 1 tablet (20 mg total) by mouth daily.   . methotrexate (RHEUMATREX) 2.5 MG tablet TAKE 6 TABLETS BY MOUTH ONCE WEEKLY. CAUTION:CHEMOTHERAPY. PROTECT FROM LIGHT.   [name] mupirocin ointment (BACTROBAN) 2 % Apply 1 application topically 2 (two) times daily.   [name] oxyCODONE (ROXICODONE) 5 MG immediate release tablet Take 1 tablet (5 mg total) by mouth every 4 (four) hours as needed for severe pain.   . Oxycodone HCl 10 MG TABS Take 1 tablet by mouth at bedtime as needed   . predniSONE (DELTASONE) 5 MG tablet TAKE 1 TABLET BY MOUTH EVERY DAY WITH BREAKFAST   . Pyridoxine HCl (VITAMIN B-6 PO) Take 1 tablet by mouth daily.   [name] [name] 15 MG TB24 TAKE 15 MG (1 TABLET) BY MOUTH DAILY.   . traMADol (ULTRAM) 50 MG tablet Take 1  tablet (50 mg total) by mouth 2 (two) times daily.    No facility-administered encounter medications on file as of 04/05/2020.    Current Diagnosis: Patient Active Problem List   Diagnosis Date Noted  . Restless leg 02/02/2020  . Bilateral leg edema 01/11/2020  . Cellulitis of right upper extremity 10/26/2019  . Cervical radiculopathy 06/28/2019  . Carpal tunnel syndrome on both sides 06/28/2019  . Bilateral cataracts 01/11/2019  . Dizziness 01/11/2019  . Postoperative nausea and vomiting 01/11/2019  . Right sided sciatica 04/14/2018  . Trochanteric bursitis of right hip 04/14/2018  . S/P cervical spinal fusion 11/10/2017  . Dysphagia 10/26/2017  . Gastroesophageal reflux disease 08/06/2017  . Easy bruising 08/06/2017  . Anxiety 04/24/2017    . Mid back pain 03/26/2017  . Dyspnea on exertion 03/14/2017  . Hyperkalemia 03/13/2017  . Submandibular gland swelling 01/08/2017  . High risk medication use 09/02/2016  . Piriformis muscle pain 09/02/2016  . Right hip pain 04/08/2016  . Right knee pain 02/25/2016  . Dyspepsia 04/05/2015  . Numbness 11/14/2014  . Biceps tendon tear 10/10/2014  . Insomnia 10/10/2014  . Gait disturbance 06/13/2014  . 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 OP (osteoporosis) 01/18/2014  . Other long term (current) drug therapy 01/18/2014  . Other dysphagia 12/29/2013  . Hoarseness 12/29/2013  . 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 Personal history of colonic polyps 07/03/2010  . Nocturia 05/28/2010  . THYROID NODULE 05/17/2008  . Multiple sclerosis ([place]) 05/17/2008  . Other fatigue 03/30/2007  . Hyperlipidemia 08/20/2006  . Essential hypertension 08/20/2006  . Rheumatoid arthritis ([place]) 08/20/2006  . CERVICAL CANCER, HX OF 08/20/2006  . GASTRIC ULCER, HX OF 08/20/2006    Goals Addressed   None     Follow-Up:  Pharmacist Review    The patient was on medication adherence list for her atorvastatin. After speaking with the patient and reviewing her medication list it showed that she takes the atorvastatin twice a week on Monday and Friday. And she makes sure that she takes its on those days.   [facility] and she will call tem when she is ready for refills.   Rosendo Gros, [facility]  Practice Team Manager/ CPA (Clinical Pharmacist Assistant) [phone]

## 2020-04-09 NOTE — Progress Notes (Signed)
Subjective:    Patient ID: Sherri [name], female    DOB: [birth year], 79 y.o.   MRN: [id number]  HPI The patient is here for an acute visit.   Weakness, fatigue -   She feels terribly weak.  In the past two weeks she stool up three times and just crumbled down to the floor.  Her legs feel weak when this happens she denies any lightheadedness or dizziness at this time.  She just feels unusually fatigued and weak.  She has a terrible sore on the roof of her mouth, which is preventing her from eating.  It started about two weeks ago.  She also states decreased appetite.  She is trying to drink fluids and think she is drinking enough fluids, but states dark urine with an odor that often occurs when she is dehydrated.  She had a UTI about 2 weeks ago - Dr Roni Bread treated her with Cipro.  Her symptoms improved, but it came back three days later after completing the antibiotic.  She took addition cipro and that resolved her symptoms.  She does have darker urine and her urine has an odor-she does not think it is an infection-may just be dehydration, but she is not sure.     Medications and allergies reviewed with patient and updated if appropriate.  Patient Active Problem List   Diagnosis Date Noted  . Restless leg 02/02/2020  . Bilateral leg edema 01/11/2020  . Cellulitis of right upper extremity 10/26/2019  . Cervical radiculopathy 06/28/2019  . Carpal tunnel syndrome on both sides 06/28/2019  . Bilateral cataracts 01/11/2019  . Dizziness 01/11/2019  . Postoperative nausea and vomiting 01/11/2019  . Right sided sciatica 04/14/2018  . Trochanteric bursitis of right hip 04/14/2018  . S/P cervical spinal fusion 11/10/2017  . Dysphagia 10/26/2017  . Gastroesophageal reflux disease 08/06/2017  . Easy bruising 08/06/2017  . Anxiety 04/24/2017  . Mid back pain 03/26/2017  . Dyspnea on exertion 03/14/2017  . Hyperkalemia 03/13/2017  . Submandibular gland swelling 01/08/2017  . High risk  medication use 09/02/2016  . Piriformis muscle pain 09/02/2016  . Right hip pain 04/08/2016  . Right knee pain 02/25/2016  . Dyspepsia 04/05/2015  . Numbness 11/14/2014  . Biceps tendon tear 10/10/2014  . Insomnia 10/10/2014  . Gait disturbance 06/13/2014  . 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 OP (osteoporosis) 01/18/2014  . Other long term (current) drug therapy 01/18/2014  . Other dysphagia 12/29/2013  . Hoarseness 12/29/2013  . 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 Personal history of colonic polyps 07/03/2010  . Nocturia 05/28/2010  . THYROID NODULE 05/17/2008  . Multiple sclerosis ([place]) 05/17/2008  . Other fatigue 03/30/2007  . Hyperlipidemia 08/20/2006  . Essential hypertension 08/20/2006  . Rheumatoid arthritis ([place]) 08/20/2006  . CERVICAL CANCER, HX OF 08/20/2006  . GASTRIC ULCER, HX OF 08/20/2006    Current Outpatient Medications on File Prior to Visit  Medication Sig Dispense Refill  . atorvastatin (LIPITOR) 10 MG tablet TAKE 1 TAB ON MONDAYS AND FRIDAY 27 tablet 3  . Biotin 5000 MCG TABS Take 5,000 mcg by mouth daily.     . Calcium Carbonate-Vitamin D (CALCIUM + D PO) Take 1 tablet by mouth daily.     . Carboxymethylcellul-Glycerin (LUBRICATING EYE DROPS OP) Apply 1 drop to eye daily as needed (dry eyes).    . carvedilol (COREG) 6.25 MG tablet TAKE 1 TABLET (6.25 MG TOTAL) BY MOUTH 2 (TWO) TIMES DAILY WITH A MEAL.  180 tablet 1  . Cholecalciferol (VITAMIN D3) 2000 units capsule Take 2,000 Units by mouth daily.    . clindamycin (CLEOCIN) 300 MG capsule Take 1 capsule (300 mg total) by mouth 3 (three) times daily. 30 capsule 0  . Cyanocobalamin (VITAMIN B-12 PO) Take 1 tablet by mouth daily.    [name] denosumab (PROLIA) 60 MG/ML SOSY injection INJECT 60 MG INTO THE SKIN EVERY 6 (SIX) MONTHS. 1 mL 0  . diazepam (VALIUM) 5 MG tablet Take 1 tablet (5 mg total) by mouth at bedtime. 30 tablet 5  . diclofenac sodium (VOLTAREN) 1 % GEL APPLY 3 GRAMS TO 3 LARGE  JOINTS 3 TIMES A DAY AS NEEDED 100 g 0  . famotidine (PEPCID) 40 MG tablet TAKE 1 TABLET BY MOUTH EVERY DAY 90 tablet 1  . folic acid (FOLVITE) 1 MG tablet TAKE 2 TABLETS BY MOUTH EVERY DAY 180 tablet 3  . furosemide (LASIX) 20 MG tablet Take 1 tablet (20 mg total) by mouth daily. 90 tablet 1  . methotrexate (RHEUMATREX) 2.5 MG tablet TAKE 6 TABLETS BY MOUTH ONCE WEEKLY. CAUTION:CHEMOTHERAPY. PROTECT FROM LIGHT. 72 tablet 0  . mupirocin ointment (BACTROBAN) 2 % Apply 1 application topically 2 (two) times daily. 30 g 3  . oxyCODONE (ROXICODONE) 5 MG immediate release tablet Take 1 tablet (5 mg total) by mouth every 4 (four) hours as needed for severe pain. 30 tablet 0  . Oxycodone HCl 10 MG TABS Take 1 tablet by mouth at bedtime as needed 30 tablet 0  . predniSONE (DELTASONE) 5 MG tablet TAKE 1 TABLET BY MOUTH EVERY DAY WITH BREAKFAST 30 tablet 0  . Pyridoxine HCl (VITAMIN B-6 PO) Take 1 tablet by mouth daily.    [name] [name] 15 MG TB24 TAKE 15 MG (1 TABLET) BY MOUTH DAILY. 30 tablet 2  . traMADol (ULTRAM) 50 MG tablet Take 1 tablet (50 mg total) by mouth 2 (two) times daily. 60 tablet 5  . ciprofloxacin (CIPRO) 250 MG tablet Take 250 mg by mouth 2 (two) times daily.  (Patient not taking: Reported on 04/10/2020)     No current facility-administered medications on file prior to visit.    Past Medical History:  Diagnosis Date  . Anxiety    takes Valium daily as needed  . Basal cell carcinoma 01/21/1988   left nostril (MOHS), sup-right calf (CX35FU)  . Basal cell carcinoma 03/22/1996   right calf (CX35FU)  . Basal cell carcinoma 03/31/1995   left wing nose  . Bruises easily    d/t meds  . Cancer ([place])    basal cell ca, in situ- uterine   . Cataracts, bilateral   . Chronic back pain   . Dizziness    r/t to meds  . GERD (gastroesophageal reflux disease)    no meds on a regular basis but will take Tums if needed  . Headache(784.0)    r/t neck issues  . History of bronchitis 6-66yrs ago  .  History of colon polyps    benign  . Hyperlipidemia    takes Atorvastatin on Mondays and Fridays  . Hypertension    takes Ramipril daily  . Joint pain   . Joint swelling   . Multiple sclerosis ([place])   . Neuromuscular disorder ([place])    Dr. Park Liter- [facility], follows [facility]  . Nocturia   . OA (osteoarthritis)    Dr Jani Gravel weekly, RA- hands- knees- feet   . PONV (postoperative nausea and vomiting)    trouble  urinating after surgery in 2014  . Postoperative nausea and vomiting 01/11/2019  . Rheumatoid arthritis(714.0)    Dr Estanislado Pandy takes [name] daily  . Right wrist fracture   . Skin cancer   . Squamous cell carcinoma of skin 11/02/2001   in situ-right knee (cx16fu)  . Squamous cell carcinoma of skin 11/12/2011   in situ-left forearm (CX35FU), in situ-left foot (CX35FU)  . Squamous cell carcinoma of skin 01/22/2012   in situ-left lower forearm (txpbx)  . Squamous cell carcinoma of skin 11/17/2012   right shin (txpbx)  . Squamous cell carcinoma of skin 10/28/2013   in situ-right shoulder (CX35FU)  . Squamous cell carcinoma of skin 04/28/2014   well diff-right shin (txpbx)  . Squamous cell carcinoma of skin 11/02/2014   in situ-Left shin (txpbx)  . Squamous cell carcinoma of skin 12/15/2014   in situ-Left hand (txpbx), bowens-left side chest (txpbx)  . Squamous cell carcinoma of skin 05/09/2015   in situ-left hand (txpbx)   . Squamous cell carcinoma of skin 05/07/2016   in situ-left inner shin,ant, in situ-left inner shin, post, in situ-left outer forearm, in situ-right knuckle  . Squamous cell carcinoma of skin 03/1302018   in situ-left shoulder (txpbx), in situ-right inner shin (txpbx), in situ-top of left foot (txpbx), KA- left forearm (txpbx)  . Squamous cell carcinoma of skin 12/02/2016   in situ-left inner shin (txpbx)  . Squamous cell carcinoma of skin 03/04/2017   in situ-left outer sup, shin (txpbx), in situ- right 2nd knuckle finger (txpbx), in  situ- Left wrist (txpbx)  . Squamous cell carcinoma of skin 04/06/2018   in situ-above left knee inner (txpbx)  . Squamous cell carcinoma of skin 04/21/2018   in situ-right top hand (txpbx)  . Squamous cell carcinoma of skin 07/08/2018   in situ-right lower inner shin (txpbx)  . Squamous cell carcinoma of skin 04/27/2019   in situ- Left neck(CX35FU), In situ- right neck (CX35FU)  . Urinary retention    sees Dr.Wrenn about 2 times a yr    Past Surgical History:  Procedure Laterality Date  . ABDOMINAL HYSTERECTOMY     1985  . APPENDECTOMY     with TAH  . BACK SURGERY     several  . BREAST BIOPSY Right    Results were negative  . BROW LIFT Bilateral 10/02/2016   Procedure: BILATERAL LOWER LID BLEPHAROPLASTY;  Surgeon: Clista Bernhardt, MD;  Location: [facility];  Service: Plastics;  Laterality: Bilateral;  . CARPAL TUNNEL RELEASE Right   . cataracts    . CERVICAL FUSION     Dr Joya Salm  . CERVICAL FUSION  12/31/2012   Dr Joya Salm  . CHEST TUBE INSERTION     for traumatic Pneumothorax  . COLONOSCOPY  07/16/2010   normal   . COLONOSCOPY W/ POLYPECTOMY  1997   negative since; Dr Deatra Ina  . ESOPHAGOGASTRODUODENOSCOPY  07/16/2010   normal  . eye lid raise    . EYE SURGERY Bilateral    cataracts removed - /w IOL  . JOINT REPLACEMENT    . LUMBAR FUSION     Dr Joya Salm  . NASAL SINUS SURGERY    . POSTERIOR CERVICAL FUSION/FORAMINOTOMY N/A 12/31/2012   Procedure: POSTERIOR LATERAL CERVICAL FUSION/FORAMINOTOMY LEVEL 1 CERVICAL THREE-FOUR WITH LATERAL MASS SCREWS;  Surgeon: Floyce Stakes, MD;  Location: [facility];  Service: Neurosurgery;  Laterality: N/A;  . PTOSIS REPAIR Bilateral 10/02/2016   Procedure: INTERNAL PTOSIS REPAIR;  Surgeon: Clista Bernhardt, MD;  Location: [facility];  Service: Plastics;  Laterality: Bilateral;  . SKIN BIOPSY    . THYROID SURGERY     R lobe removed- 1972, has grown back - CT last done- 2018  . TOTAL HIP ARTHROPLASTY  12/22/2011   Procedure: TOTAL HIP  ARTHROPLASTY;  Surgeon: Kerin Salen, MD;  Location: [facility];  Service: Orthopedics;  Laterality: Left;  . TOTAL SHOULDER ARTHROPLASTY    . TUBAL LIGATION      Social History   Socioeconomic History  . Marital status: Married    Spouse name: Not on file  . Number of children: Not on file  . Years of education: Not on file  . Highest education level: Not on file  Occupational History  . Occupation: retired  Tobacco Use  . Smoking status: Former Smoker    Packs/day: 2.50    Years: 20.00    Pack years: 50.00    Types: Cigarettes    Quit date: 05/26/1981    Years since quitting: 38.9  . Smokeless tobacco: Never Used  . Tobacco comment: Smoked [phone], up to 2.5 ppd  Vaping Use  . Vaping Use: Never used  Substance and Sexual Activity  . Alcohol use: Yes    Alcohol/week: 14.0 standard drinks    Types: 14 Cans of beer per week    Comment: 2% beer  . Drug use: Never  . Sexual activity: Yes  Other Topics Concern  . Not on file  Social History Narrative  . Not on file   Social Determinants of Health   Financial Resource Strain: Low Risk   . Difficulty of Paying Living Expenses: Not very hard  Food Insecurity:   . Worried About Charity fundraiser in the Last Year: Not on file  . Ran Out of Food in the Last Year: Not on file  Transportation Needs:   . Lack of Transportation (Medical): Not on file  . Lack of Transportation (Non-Medical): Not on file  Physical Activity:   . Days of Exercise per Week: Not on file  . Minutes of Exercise per Session: Not on file  Stress:   . Feeling of Stress : Not on file  Social Connections:   . Frequency of Communication with Friends and Family: Not on file  . Frequency of Social Gatherings with Friends and Family: Not on file  . Attends Religious Services: Not on file  . Active Member of Clubs or Organizations: Not on file  . Attends Archivist Meetings: Not on file  . Marital Status: Not on file    Family History  Problem  Relation Age of Onset  . Cancer Mother        pancreatic  . Diabetes Mother   . Heart disease Father        Rheumatic  . Cancer Father        ? stomach  . Cancer Sister        stomach  . Cancer Maternal Aunt        X 4; ? primary  . Heart disease Paternal Aunt   . Cancer Maternal Grandmother        cervical  . Colon cancer Other        Aunts  . Multiple sclerosis Daughter   . Hypertension Neg Hx   . Stroke Neg Hx     Review of Systems  Constitutional: Positive for appetite change (decreased) and fatigue. Negative for chills and fever.  HENT: Positive for mouth sores. Negative  for congestion, ear pain, sinus pain and sore throat.   Respiratory: Negative for cough, shortness of breath and wheezing.   Cardiovascular: Positive for leg swelling (chronic, a little more than usual). Negative for chest pain and palpitations.  Gastrointestinal: Positive for diarrhea (two weeks ago for 3 days). Negative for abdominal pain, blood in stool and nausea.  Genitourinary: Negative for dysuria, frequency, hematuria and urgency.  Musculoskeletal:       No increase in joint pain or muscle pain  Skin: Negative for rash.  Neurological: Negative for light-headedness and headaches.       Objective:   Vitals:   04/10/20 0828  BP: 110/60  Pulse: 77  Temp: 98.4 F (36.9 C)  SpO2: 96%   BP Readings from Last 3 Encounters:  04/10/20 110/60  02/29/20 133/78  02/02/20 126/69   Wt Readings from Last 3 Encounters:  04/10/20 117 lb 9.6 oz (53.3 kg)  02/29/20 118 lb 9.6 oz (53.8 kg)  02/02/20 117 lb (53.1 kg)   Body mass index is 21.51 kg/m.   Physical Exam Constitutional:      Appearance: Normal appearance. She is not ill-appearing.  HENT:     Head: Normocephalic and atraumatic.     Right Ear: Tympanic membrane, ear canal and external ear normal.     Left Ear: Tympanic membrane, ear canal and external ear normal.     Nose: Nose normal.     Comments: Sore on his mouth near front  teeth    Mouth/Throat:     Mouth: Mucous membranes are moist.     Pharynx: No oropharyngeal exudate or posterior oropharyngeal erythema.  Eyes:     Conjunctiva/sclera: Conjunctivae normal.  Cardiovascular:     Rate and Rhythm: Normal rate and regular rhythm.     Heart sounds: No murmur heard.   Pulmonary:     Effort: Pulmonary effort is normal. No respiratory distress.     Breath sounds: Normal breath sounds.  Abdominal:     General: There is no distension.     Palpations: Abdomen is soft.     Tenderness: There is no abdominal tenderness.  Musculoskeletal:     Cervical back: Neck supple. No tenderness.     Right lower leg: No edema.     Left lower leg: No edema.  Skin:    General: Skin is warm and dry.  Neurological:     Mental Status: She is alert.            Assessment & Plan:    See Problem List for Assessment and Plan of chronic medical problems.    This visit occurred during the SARS-CoV-2 public health emergency.  Safety protocols were in place, including screening questions prior to the visit, additional usage of staff PPE, and extensive cleaning of exam room while observing appropriate contact time as indicated for disinfecting solutions.

## 2020-04-10 ENCOUNTER — Ambulatory Visit (INDEPENDENT_AMBULATORY_CARE_PROVIDER_SITE_OTHER): Payer: Medicare Other

## 2020-04-10 ENCOUNTER — Ambulatory Visit (INDEPENDENT_AMBULATORY_CARE_PROVIDER_SITE_OTHER): Payer: Medicare Other | Admitting: Internal Medicine

## 2020-04-10 ENCOUNTER — Encounter: Payer: Self-pay | Admitting: Internal Medicine

## 2020-04-10 ENCOUNTER — Other Ambulatory Visit: Payer: Self-pay

## 2020-04-10 ENCOUNTER — Ambulatory Visit: Payer: Medicare Other | Admitting: Internal Medicine

## 2020-04-10 ENCOUNTER — Telehealth: Payer: Self-pay | Admitting: Internal Medicine

## 2020-04-10 VITALS — BP 110/60 | HR 77 | Temp 98.4°F | Ht 62.0 in | Wt 117.6 lb

## 2020-04-10 DIAGNOSIS — I10 Essential (primary) hypertension: Secondary | ICD-10-CM

## 2020-04-10 DIAGNOSIS — E860 Dehydration: Secondary | ICD-10-CM

## 2020-04-10 DIAGNOSIS — K1379 Other lesions of oral mucosa: Secondary | ICD-10-CM

## 2020-04-10 DIAGNOSIS — R82998 Other abnormal findings in urine: Secondary | ICD-10-CM | POA: Diagnosis not present

## 2020-04-10 DIAGNOSIS — R8290 Unspecified abnormal findings in urine: Secondary | ICD-10-CM

## 2020-04-10 DIAGNOSIS — R531 Weakness: Secondary | ICD-10-CM | POA: Diagnosis not present

## 2020-04-10 DIAGNOSIS — J449 Chronic obstructive pulmonary disease, unspecified: Secondary | ICD-10-CM | POA: Diagnosis not present

## 2020-04-10 DIAGNOSIS — R5383 Other fatigue: Secondary | ICD-10-CM | POA: Diagnosis not present

## 2020-04-10 LAB — COMPREHENSIVE METABOLIC PANEL
ALT: 18 U/L (ref 0–35)
AST: 29 U/L (ref 0–37)
Albumin: 4.2 g/dL (ref 3.5–5.2)
Alkaline Phosphatase: 29 U/L — ABNORMAL LOW (ref 39–117)
BUN: 13 mg/dL (ref 6–23)
CO2: 29 mEq/L (ref 19–32)
Calcium: 9.3 mg/dL (ref 8.4–10.5)
Chloride: 94 mEq/L — ABNORMAL LOW (ref 96–112)
Creatinine, Ser: 1.08 mg/dL (ref 0.40–1.20)
GFR: 49.33 mL/min — ABNORMAL LOW (ref >60.00)
Glucose, Bld: 109 mg/dL — ABNORMAL HIGH (ref 70–99)
Potassium: 3.8 mEq/L (ref 3.5–5.1)
Sodium: 131 mEq/L — ABNORMAL LOW (ref 135–145)
Total Bilirubin: 0.7 mg/dL (ref 0.2–1.2)
Total Protein: 7 g/dL (ref 6.0–8.3)

## 2020-04-10 LAB — URINALYSIS, ROUTINE W REFLEX MICROSCOPIC
Bilirubin Urine: NEGATIVE
Ketones, ur: NEGATIVE
Nitrite: POSITIVE — AB
Specific Gravity, Urine: 1.015 (ref 1.000–1.030)
Total Protein, Urine: NEGATIVE
Urine Glucose: NEGATIVE
Urobilinogen, UA: 0.2 (ref 0.0–1.0)
pH: 6 (ref 5.0–8.0)

## 2020-04-10 LAB — CBC WITH DIFFERENTIAL/PLATELET
Basophils Absolute: 0 10*3/uL (ref 0.0–0.1)
Basophils Relative: 0.4 % (ref 0.0–3.0)
Eosinophils Absolute: 0 10*3/uL (ref 0.0–0.7)
Eosinophils Relative: 0.1 % (ref 0.0–5.0)
HCT: 36.5 % (ref 36.0–46.0)
Hemoglobin: 12.7 g/dL (ref 12.0–15.0)
Lymphocytes Relative: 10.2 % — ABNORMAL LOW (ref 12.0–46.0)
Lymphs Abs: 0.7 10*3/uL (ref 0.7–4.0)
MCHC: 34.9 g/dL (ref 30.0–36.0)
MCV: 104.1 fl — ABNORMAL HIGH (ref 78.0–100.0)
Monocytes Absolute: 1.1 10*3/uL — ABNORMAL HIGH (ref 0.1–1.0)
Monocytes Relative: 16.7 % — ABNORMAL HIGH (ref 3.0–12.0)
Neutro Abs: 5 10*3/uL (ref 1.4–7.7)
Neutrophils Relative %: 72.6 % (ref 43.0–77.0)
Platelets: 266 10*3/uL (ref 150.0–400.0)
RBC: 3.51 Mil/uL — ABNORMAL LOW (ref 3.87–5.11)
RDW: 15.2 % (ref 11.5–15.5)
WBC: 6.8 10*3/uL (ref 4.0–10.5)

## 2020-04-10 LAB — TSH: TSH: 1.37 u[IU]/mL (ref 0.35–4.50)

## 2020-04-10 MED ORDER — TRIAMCINOLONE ACETONIDE 0.1 % MT PSTE: 1.0000 "application " | PASTE | Freq: Two times a day (BID) | OROMUCOSAL | 1 refills | Status: DC

## 2020-04-10 MED ORDER — CEPHALEXIN 500 MG PO CAPS: 500.0000 mg | ORAL_CAPSULE | Freq: Two times a day (BID) | ORAL | 0 refills | Status: DC

## 2020-04-10 NOTE — Telephone Encounter (Signed)
Please call her-her urine does show an infection and this is likely the cause of most of her symptoms.  I do not want to use the Cipro again since this did not seem to get rid of it.  Lets try Keflex-I will send this to your pharmacy  Your chest x-ray was normal.  Her kidney is slightly decreased - likely from dehydration or the infection.  Liver tests, thyroid function and blood counts normal.      I would recommend f/u with Dr Roni Bread since this infection is not going away or we can recheck her urine after completing the abx

## 2020-04-10 NOTE — Telephone Encounter (Signed)
Left message for patient to return call to clinic along with results.

## 2020-04-10 NOTE — Patient Instructions (Addendum)
Have blood work, urine and a chest xray downstairs.     Try the triamcinolone paste for your mouth sore.

## 2020-04-10 NOTE — Assessment & Plan Note (Signed)
Acute Had a UTI 2 weeks ago and was treated with an extended course of Cipro-this did improve her symptoms, but now has concentrated urine with an odor-dehydration versus possible recurrent UTI Check urinalysis, urine culture

## 2020-04-10 NOTE — Assessment & Plan Note (Signed)
Acute Generalized weakness for the past couple of weeks Associated with fatigue Concern for possible infection even her immunocompromised state.  She is not having any other symptoms suggestive of an autoimmune flare Check chest x-ray, CBC, CMP, TSH, UA, urine culture

## 2020-04-10 NOTE — Assessment & Plan Note (Signed)
Chronic Blood pressure looks okay today-a little bit lower for her than usual. Continue carvedilol 6.25 mg twice daily, Lasix 20 mg daily

## 2020-04-10 NOTE — Assessment & Plan Note (Signed)
Acute Started about 2 weeks ago with her other symptoms Possibly related to immunodeficiency/autoimmune disease If this is preventing her from eating some Trial of triamcinolone paste

## 2020-04-10 NOTE — Telephone Encounter (Signed)
Patient responded via mychart.

## 2020-04-10 NOTE — Telephone Encounter (Signed)
Mychart message sent to patient as well.

## 2020-04-10 NOTE — Assessment & Plan Note (Signed)
Acute Her blood pressure has been on the lower side for her and her urine is concentrated per patient She has not been eating as much because of her mouth sore, but think she is drinking enough, but I do not think that is the case Likely mild dehydration CBC, CMP Advised her to push the fluids

## 2020-04-13 ENCOUNTER — Encounter: Payer: Self-pay | Admitting: Internal Medicine

## 2020-04-13 LAB — URINE CULTURE

## 2020-04-14 MED ORDER — SULFAMETHOXAZOLE-TRIMETHOPRIM 800-160 MG PO TABS: 1.0000 | ORAL_TABLET | Freq: Two times a day (BID) | ORAL | 0 refills | Status: DC

## 2020-04-14 NOTE — Addendum Note (Signed)
Addended by: Binnie Rail on: 04/14/2020 05:11 PM   Modules accepted: Orders

## 2020-04-15 ENCOUNTER — Encounter: Payer: Self-pay | Admitting: Internal Medicine

## 2020-04-15 DIAGNOSIS — N3000 Acute cystitis without hematuria: Secondary | ICD-10-CM

## 2020-04-17 ENCOUNTER — Other Ambulatory Visit: Payer: Self-pay

## 2020-04-17 ENCOUNTER — Other Ambulatory Visit: Payer: Self-pay | Admitting: Rheumatology

## 2020-04-17 ENCOUNTER — Encounter: Payer: Self-pay | Admitting: Family Medicine

## 2020-04-17 ENCOUNTER — Ambulatory Visit (INDEPENDENT_AMBULATORY_CARE_PROVIDER_SITE_OTHER): Payer: Medicare Other | Admitting: Family Medicine

## 2020-04-17 DIAGNOSIS — R5383 Other fatigue: Secondary | ICD-10-CM

## 2020-04-17 DIAGNOSIS — R531 Weakness: Secondary | ICD-10-CM

## 2020-04-17 DIAGNOSIS — N3000 Acute cystitis without hematuria: Secondary | ICD-10-CM

## 2020-04-17 MED ORDER — FLUCONAZOLE 150 MG PO TABS: 150.0000 mg | ORAL_TABLET | ORAL | 1 refills | Status: DC

## 2020-04-17 MED ORDER — NITROFURANTOIN MONOHYD MACRO 100 MG PO CAPS: 100.0000 mg | ORAL_CAPSULE | Freq: Two times a day (BID) | ORAL | 0 refills | Status: DC

## 2020-04-17 NOTE — Addendum Note (Signed)
Addended by: Hortencia Pilar on: 04/17/2020 02:46 PM   Modules accepted: Orders

## 2020-04-17 NOTE — Telephone Encounter (Signed)
Last Visit: 02/29/2020 Next Visit: 06/27/2020  Current Dose per office note 02/29/2020: prednisone 5 mg p.o. daily. [id number] arthritis involving multiple sites with positive rheumatoid factor   Okay to refill per Dr. Estanislado Pandy

## 2020-04-17 NOTE — Progress Notes (Signed)
Office Visit Note   Patient: Sherri [name]           Date of Birth: 07/16/1940           MRN: [id number] Visit Date: 04/17/2020 Requested by: Binnie Rail, MD [place],  [place] [address] PCP: Binnie Rail, MD  Subjective: Chief Complaint  Patient presents with  . urinary issues/constipation/mouth sores    HPI: She is here with fatigue and weakness.  Earlier this month she started having extreme fatigue, just not feeling well.  She went to her urologist and was treated for a UTI with Cipro.  She was not getting better so she saw her PCP who gave her Septra after checking another urine specimen.  Patient feels like she is not making any progress, and she has developed sores on the roof of her mouth as well as constipation.  She continues to have some urinary issues.  She came to me to get another opinion.  No fever or chills.  No respiratory symptoms.                ROS:   All other systems were reviewed and are negative.  Objective: Vital Signs: There were no vitals taken for this visit.  Physical Exam:  General:  Alert and oriented, in no acute distress. Pulm:  Breathing unlabored. Psy:  Normal mood, congruent affect. Skin: No rash HEENT:  [place]/AT, PERRLA, EOM Full, no nystagmus.  Funduscopic examination within normal limits.  No conjunctival erythema.  Tympanic membranes are pearly gray with normal landmarks.  External ear canals are normal.  Nasal passages are clear.  Oropharynx is clear.  No significant lymphadenopathy.  No thyromegaly or nodules.  2+ carotid pulses without bruits. CV: Regular rate and rhythm without murmurs, rubs, or gallops.  No peripheral edema.  2+ radial and posterior tibial pulses. Lungs: Clear to auscultation throughout with no wheezing or areas of consolidation. Abdomen: Nontender   Imaging: No results found.  Assessment & Plan: 1.  Malaise and fatigue, etiology uncertain.  Could be due to UTI. -Discussed options with her,  elected to recheck urinalysis with culture.  We will switch to Macrobid.  Consider additional lab work if she fails to improve.     Procedures: No procedures performed  No notes on file     PMFS History: Patient Active Problem List   Diagnosis Date Noted  . Dark urine 04/10/2020  . Mouth sore 04/10/2020  . Abnormal urine odor 04/10/2020  . Dehydration 04/10/2020  . Restless leg 02/02/2020  . Bilateral leg edema 01/11/2020  . Cellulitis of right upper extremity 10/26/2019  . Cervical radiculopathy 06/28/2019  . Carpal tunnel syndrome on both sides 06/28/2019  . Bilateral cataracts 01/11/2019  . Dizziness 01/11/2019  . Postoperative nausea and vomiting 01/11/2019  . Right sided sciatica 04/14/2018  . Trochanteric bursitis of right hip 04/14/2018  . S/P cervical spinal fusion 11/10/2017  . Dysphagia 10/26/2017  . Gastroesophageal reflux disease 08/06/2017  . Easy bruising 08/06/2017  . Anxiety 04/24/2017  . Mid back pain 03/26/2017  . Dyspnea on exertion 03/14/2017  . Hyperkalemia 03/13/2017  . Submandibular gland swelling 01/08/2017  . High risk medication use 09/02/2016  . Piriformis muscle pain 09/02/2016  . Weakness 07/22/2016  . Right hip pain 04/08/2016  . Right knee pain 02/25/2016  . Dyspepsia 04/05/2015  . Numbness 11/14/2014  . Biceps tendon tear 10/10/2014  . Insomnia 10/10/2014  . Gait disturbance 06/13/2014  . Depression  with anxiety 06/13/2014  . OP (osteoporosis) 01/18/2014  . Other long term (current) drug therapy 01/18/2014  . Other dysphagia 12/29/2013  . Hoarseness 12/29/2013  . 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 Personal history of colonic polyps 07/03/2010  . Nocturia 05/28/2010  . THYROID NODULE 05/17/2008  . Multiple sclerosis ([place]) 05/17/2008  . Other fatigue 03/30/2007  . Hyperlipidemia 08/20/2006  . Essential hypertension 08/20/2006  . Rheumatoid arthritis ([place]) 08/20/2006  . CERVICAL CANCER, HX OF 08/20/2006  .  GASTRIC ULCER, HX OF 08/20/2006   Past Medical History:  Diagnosis Date  . Anxiety    takes Valium daily as needed  . Basal cell carcinoma 01/21/1988   left nostril (MOHS), sup-right calf (CX35FU)  . Basal cell carcinoma 03/22/1996   right calf (CX35FU)  . Basal cell carcinoma 03/31/1995   left wing nose  . Bruises easily    d/t meds  . Cancer ([place])    basal cell ca, in situ- uterine   . Cataracts, bilateral   . Chronic back pain   . Dizziness    r/t to meds  . GERD (gastroesophageal reflux disease)    no meds on a regular basis but will take Tums if needed  . Headache(784.0)    r/t neck issues  . History of bronchitis 6-74yrs ago  . History of colon polyps    benign  . Hyperlipidemia    takes Atorvastatin on Mondays and Fridays  . Hypertension    takes Ramipril daily  . Joint pain   . Joint swelling   . Multiple sclerosis ([place])   . Neuromuscular disorder ([place])    Dr. Park Liter- [facility], follows [facility]  . Nocturia   . OA (osteoarthritis)    Dr Jani Gravel weekly, RA- hands- knees- feet   . PONV (postoperative nausea and vomiting)    trouble urinating after surgery in 2014  . Postoperative nausea and vomiting 01/11/2019  . Rheumatoid arthritis(714.0)    Dr Estanislado Pandy takes [name] daily  . Right wrist fracture   . Skin cancer   . Squamous cell carcinoma of skin 11/02/2001   in situ-right knee (cx7fu)  . Squamous cell carcinoma of skin 11/12/2011   in situ-left forearm (CX35FU), in situ-left foot (CX35FU)  . Squamous cell carcinoma of skin 01/22/2012   in situ-left lower forearm (txpbx)  . Squamous cell carcinoma of skin 11/17/2012   right shin (txpbx)  . Squamous cell carcinoma of skin 10/28/2013   in situ-right shoulder (CX35FU)  . Squamous cell carcinoma of skin 04/28/2014   well diff-right shin (txpbx)  . Squamous cell carcinoma of skin 11/02/2014   in situ-Left shin (txpbx)  . Squamous cell carcinoma of skin 12/15/2014   in situ-Left hand  (txpbx), bowens-left side chest (txpbx)  . Squamous cell carcinoma of skin 05/09/2015   in situ-left hand (txpbx)   . Squamous cell carcinoma of skin 05/07/2016   in situ-left inner shin,ant, in situ-left inner shin, post, in situ-left outer forearm, in situ-right knuckle  . Squamous cell carcinoma of skin 03/1302018   in situ-left shoulder (txpbx), in situ-right inner shin (txpbx), in situ-top of left foot (txpbx), KA- left forearm (txpbx)  . Squamous cell carcinoma of skin 12/02/2016   in situ-left inner shin (txpbx)  . Squamous cell carcinoma of skin 03/04/2017   in situ-left outer sup, shin (txpbx), in situ- right 2nd knuckle finger (txpbx), in situ- Left wrist (txpbx)  . Squamous cell carcinoma of skin 04/06/2018   in  situ-above left knee inner (txpbx)  . Squamous cell carcinoma of skin 04/21/2018   in situ-right top hand (txpbx)  . Squamous cell carcinoma of skin 07/08/2018   in situ-right lower inner shin (txpbx)  . Squamous cell carcinoma of skin 04/27/2019   in situ- Left neck(CX35FU), In situ- right neck (CX35FU)  . Urinary retention    sees Dr.Wrenn about 2 times a yr    Family History  Problem Relation Age of Onset  . Cancer Mother        pancreatic  . Diabetes Mother   . Heart disease Father        Rheumatic  . Cancer Father        ? stomach  . Cancer Sister        stomach  . Cancer Maternal Aunt        X 4; ? primary  . Heart disease Paternal Aunt   . Cancer Maternal Grandmother        cervical  . Colon cancer Other        Aunts  . Multiple sclerosis Daughter   . Hypertension Neg Hx   . Stroke Neg Hx     Past Surgical History:  Procedure Laterality Date  . ABDOMINAL HYSTERECTOMY     1985  . APPENDECTOMY     with TAH  . BACK SURGERY     several  . BREAST BIOPSY Right    Results were negative  . BROW LIFT Bilateral 10/02/2016   Procedure: BILATERAL LOWER LID BLEPHAROPLASTY;  Surgeon: Clista Bernhardt, MD;  Location: [facility];  Service: Plastics;   Laterality: Bilateral;  . CARPAL TUNNEL RELEASE Right   . cataracts    . CERVICAL FUSION     Dr Joya Salm  . CERVICAL FUSION  12/31/2012   Dr Joya Salm  . CHEST TUBE INSERTION     for traumatic Pneumothorax  . COLONOSCOPY  07/16/2010   normal   . COLONOSCOPY W/ POLYPECTOMY  1997   negative since; Dr Deatra Ina  . ESOPHAGOGASTRODUODENOSCOPY  07/16/2010   normal  . eye lid raise    . EYE SURGERY Bilateral    cataracts removed - /w IOL  . JOINT REPLACEMENT    . LUMBAR FUSION     Dr Joya Salm  . NASAL SINUS SURGERY    . POSTERIOR CERVICAL FUSION/FORAMINOTOMY N/A 12/31/2012   Procedure: POSTERIOR LATERAL CERVICAL FUSION/FORAMINOTOMY LEVEL 1 CERVICAL THREE-FOUR WITH LATERAL MASS SCREWS;  Surgeon: Floyce Stakes, MD;  Location: [facility];  Service: Neurosurgery;  Laterality: N/A;  . PTOSIS REPAIR Bilateral 10/02/2016   Procedure: INTERNAL PTOSIS REPAIR;  Surgeon: Clista Bernhardt, MD;  Location: [facility];  Service: Plastics;  Laterality: Bilateral;  . SKIN BIOPSY    . THYROID SURGERY     R lobe removed- 1972, has grown back - CT last done- 2018  . TOTAL HIP ARTHROPLASTY  12/22/2011   Procedure: TOTAL HIP ARTHROPLASTY;  Surgeon: Kerin Salen, MD;  Location: [facility];  Service: Orthopedics;  Laterality: Left;  . TOTAL SHOULDER ARTHROPLASTY    . TUBAL LIGATION     Social History   Occupational History  . Occupation: retired  Tobacco Use  . Smoking status: Former Smoker    Packs/day: 2.50    Years: 20.00    Pack years: 50.00    Types: Cigarettes    Quit date: 05/26/1981    Years since quitting: 38.9  . Smokeless tobacco: Never Used  . Tobacco comment: Smoked [phone], up  to 2.5 ppd  Vaping Use  . Vaping Use: Never used  Substance and Sexual Activity  . Alcohol use: Yes    Alcohol/week: 14.0 standard drinks    Types: 14 Cans of beer per week    Comment: 2% beer  . Drug use: Never  . Sexual activity: Yes

## 2020-04-19 LAB — URINALYSIS W MICROSCOPIC + REFLEX CULTURE
Bilirubin Urine: NEGATIVE
Glucose, UA: NEGATIVE
Hgb urine dipstick: NEGATIVE
Hyaline Cast: NONE SEEN /LPF
Ketones, ur: NEGATIVE
Nitrites, Initial: NEGATIVE
Protein, ur: NEGATIVE
Specific Gravity, Urine: 1.005 (ref 1.001–1.03)
pH: 6.5 (ref 5.0–8.0)

## 2020-04-19 LAB — CULTURE INDICATED

## 2020-04-19 LAB — URINE CULTURE
MICRO NUMBER:: 11243243
Result:: NO GROWTH
SPECIMEN QUALITY:: ADEQUATE

## 2020-04-22 ENCOUNTER — Encounter: Payer: Self-pay | Admitting: Family Medicine

## 2020-04-23 ENCOUNTER — Telehealth: Payer: Self-pay

## 2020-04-23 ENCOUNTER — Telehealth: Payer: Self-pay | Admitting: Family Medicine

## 2020-04-23 MED ORDER — POLYETHYLENE GLYCOL 3350 8.5 G PO PACK
1.0000 | PACK | Freq: Two times a day (BID) | ORAL | 3 refills | Status: DC | PRN
Start: 2020-04-23 — End: 2020-06-27

## 2020-04-23 MED ORDER — POLYETHYLENE GLYCOL 3350 8.5 G PO PACK: 1.0000 | PACK | Freq: Two times a day (BID) | ORAL | 3 refills | Status: DC | PRN

## 2020-04-23 MED FILL — RINVOQ 15 MG TB24: 15 | 30 days supply | Qty: 30 | Fill #1

## 2020-04-23 NOTE — Telephone Encounter (Signed)
No bacteria seen in urine.

## 2020-04-23 NOTE — Telephone Encounter (Signed)
A patient called in regarding her message on my chart , said [facility] and [facility] not [facility] for her . Wanted to know if hilts can send in prescription .  [facility] [address]

## 2020-04-23 NOTE — Telephone Encounter (Signed)
Was sent in to [facility]. [facility] [address]

## 2020-04-23 NOTE — Telephone Encounter (Signed)
I called and advised the patient the [facility]

## 2020-04-23 NOTE — Telephone Encounter (Signed)
Done

## 2020-04-26 ENCOUNTER — Telehealth: Payer: Self-pay

## 2020-04-26 MED ORDER — BENZONATATE 100 MG PO CAPS
100.0000 mg | ORAL_CAPSULE | Freq: Three times a day (TID) | ORAL | 1 refills | Status: DC | PRN
Start: 2020-04-26 — End: 2020-06-27

## 2020-04-26 NOTE — Telephone Encounter (Signed)
I called and advised the patient of the Rx.

## 2020-04-26 NOTE — Telephone Encounter (Signed)
Please advise 

## 2020-04-26 NOTE — Telephone Encounter (Signed)
Patient stated since her last visit she has developed a dry cough she stated no over the counter medication helps she is requesting medication that will help.CB:[phone] [facility] [address]

## 2020-04-26 NOTE — Addendum Note (Signed)
Addended by: Hortencia Pilar on: 04/26/2020 10:34 AM   Modules accepted: Orders

## 2020-04-26 NOTE — Telephone Encounter (Signed)
Tessalon Perles Rx sent.

## 2020-04-30 ENCOUNTER — Other Ambulatory Visit: Payer: Self-pay | Admitting: Family

## 2020-04-30 ENCOUNTER — Other Ambulatory Visit: Payer: Self-pay | Admitting: Internal Medicine

## 2020-05-02 ENCOUNTER — Ambulatory Visit (INDEPENDENT_AMBULATORY_CARE_PROVIDER_SITE_OTHER): Payer: Medicare Other

## 2020-05-02 ENCOUNTER — Other Ambulatory Visit: Payer: Self-pay

## 2020-05-02 ENCOUNTER — Telehealth: Payer: Self-pay | Admitting: Family Medicine

## 2020-05-02 DIAGNOSIS — R300 Dysuria: Secondary | ICD-10-CM | POA: Diagnosis not present

## 2020-05-02 DIAGNOSIS — R350 Frequency of micturition: Secondary | ICD-10-CM

## 2020-05-02 NOTE — Telephone Encounter (Signed)
The patient will be coming in this afternoon to collect a urine specimen - will send specimen to [facility] for testing.

## 2020-05-02 NOTE — Progress Notes (Signed)
Lab visit: urine collection to send off for UA/reflex to UC. The patient c/o dysuria, urinary frequency and urgency. Will notify patient of results once available.

## 2020-05-02 NOTE — Telephone Encounter (Signed)
Ok

## 2020-05-02 NOTE — Telephone Encounter (Signed)
Yes

## 2020-05-02 NOTE — Telephone Encounter (Signed)
Patient called requesting a call back from [place]. Patient isd asking if she can drop off a urine sample to be tested for UTI. Please call patient at [phone].

## 2020-05-04 ENCOUNTER — Telehealth: Payer: Self-pay | Admitting: Family Medicine

## 2020-05-04 ENCOUNTER — Other Ambulatory Visit: Payer: Self-pay

## 2020-05-04 ENCOUNTER — Telehealth: Payer: Self-pay

## 2020-05-04 MED ORDER — CIPROFLOXACIN HCL 500 MG PO TABS: 500.0000 mg | ORAL_TABLET | Freq: Two times a day (BID) | ORAL | 0 refills | Status: DC

## 2020-05-04 NOTE — Telephone Encounter (Signed)
I called it in to [place] - patient advised.

## 2020-05-04 NOTE — Telephone Encounter (Signed)
Patient called regarding rx for cipro she stated [facility] but the [facility] at [facility] has it . Pharmacy:[address] CB:[phone]

## 2020-05-04 NOTE — Telephone Encounter (Signed)
I called and advised the patient. We will let her know if we need to change the antibiotic once the sensitivity results are in. If she does not hear back, she is to continue the Cipro until gone.

## 2020-05-04 NOTE — Telephone Encounter (Signed)
Patient called requesting a call back from [place] with results from urine test. Please call patient at [phone].

## 2020-05-04 NOTE — Telephone Encounter (Signed)
E. Coli present in urine.  Will treat with cipro.

## 2020-05-04 NOTE — Telephone Encounter (Signed)
Please see the partial lab report (hard copy). Sensitivity report is not available yet.

## 2020-05-05 LAB — URINALYSIS W MICROSCOPIC + REFLEX CULTURE
Bilirubin Urine: NEGATIVE
Glucose, UA: NEGATIVE
Hyaline Cast: NONE SEEN /LPF
Ketones, ur: NEGATIVE
Nitrites, Initial: NEGATIVE
Protein, ur: NEGATIVE
RBC / HPF: NONE SEEN /HPF (ref 0–2)
Specific Gravity, Urine: 1.003 (ref 1.001–1.03)
Squamous Epithelial / HPF: NONE SEEN /HPF (ref <=5)
WBC, UA: >=60 /HPF — AB (ref 0–5)
pH: 7.5 (ref 5.0–8.0)

## 2020-05-05 LAB — URINE CULTURE
MICRO NUMBER:: 11294070
SPECIMEN QUALITY:: ADEQUATE

## 2020-05-05 LAB — CULTURE INDICATED

## 2020-05-07 ENCOUNTER — Telehealth: Payer: Self-pay | Admitting: Family Medicine

## 2020-05-07 ENCOUNTER — Other Ambulatory Visit: Payer: Self-pay | Admitting: Rheumatology

## 2020-05-07 MED ORDER — NITROFURANTOIN MONOHYD MACRO 100 MG PO CAPS: 100.0000 mg | ORAL_CAPSULE | Freq: Two times a day (BID) | ORAL | 1 refills | Status: DC

## 2020-05-07 NOTE — Telephone Encounter (Signed)
Last Visit: 02/29/2020 Next Visit: 06/27/2020  Current Dose per office note 02/29/2020: prednisone 5 mg p.o. daily  Okay to refill per Dr. Estanislado Pandy

## 2020-05-07 NOTE — Telephone Encounter (Signed)
I called: the pharmacy will not have the new antibiotic until tomorrow. The bacteria that grew out is resistent to the current antibiotic (Cipro), so she will wait until the pharmacy has the [place]  - will take this until gone.

## 2020-05-07 NOTE — Telephone Encounter (Signed)
Pt called about if she should take her current medication or the new one. Please call her at [phone]

## 2020-05-07 NOTE — Telephone Encounter (Signed)
E. Coli is resistant to cipro, so will call in macrobid.

## 2020-05-07 NOTE — Telephone Encounter (Signed)
Pt called asking if she should take the current medication she has or the new medication.  Please call her [phone]

## 2020-05-08 ENCOUNTER — Other Ambulatory Visit: Payer: Self-pay | Admitting: Rheumatology

## 2020-05-08 DIAGNOSIS — M0579 Rheumatoid arthritis with rheumatoid factor of multiple sites without organ or systems involvement: Secondary | ICD-10-CM

## 2020-05-08 NOTE — Telephone Encounter (Addendum)
Last Visit: 02/29/2020 Next Visit: 06/27/2020 Labs: 04/10/2020 Sodium 131, Chloride 94, Glucose 109, Alk phos 29, GFR 49.33, RBC 3.51, MVC 104.1, Lymphocytes Relative 10.2, Monocytes Relative 16.7, Monocytes Absolute 1.1 TB Gold: 10/04/2019 Neg   Current Dose per office note 02/29/2020: [name] 59m po qd  DX: Rheumatoid arthritis involving multiple sites with positive rheumatoid factor   Okay to refill [name]?

## 2020-05-09 ENCOUNTER — Other Ambulatory Visit: Payer: Self-pay | Admitting: Pharmacist

## 2020-05-09 ENCOUNTER — Other Ambulatory Visit: Payer: Self-pay | Admitting: Physician Assistant

## 2020-05-09 DIAGNOSIS — M0579 Rheumatoid arthritis with rheumatoid factor of multiple sites without organ or systems involvement: Secondary | ICD-10-CM

## 2020-05-09 MED ORDER — RINVOQ 15 MG PO TB24: 15.0000 mg | ORAL_TABLET | Freq: Every day | ORAL | 1 refills | Status: DC

## 2020-05-15 ENCOUNTER — Encounter: Payer: Self-pay | Admitting: Family Medicine

## 2020-05-16 MED FILL — RINVOQ 15 MG TB24: 15 | 60 days supply | Qty: 60 | Fill #0

## 2020-05-23 ENCOUNTER — Other Ambulatory Visit: Payer: Self-pay | Admitting: Physician Assistant

## 2020-05-23 ENCOUNTER — Other Ambulatory Visit: Payer: Self-pay | Admitting: Rheumatology

## 2020-05-23 ENCOUNTER — Telehealth: Payer: Self-pay

## 2020-05-23 NOTE — Telephone Encounter (Signed)
Last Visit:02/29/2020 Next Visit:06/27/2020 Labs:04/10/2020 Sodium 131, Chloride 94, Glucose 109, Alk phos 29, GFR 49.33, RBC 3.51, MVC 104.1, Lymphocytes Relative 10.2, Monocytes Relative 16.7, Monocytes Absolute 1.1  Current Dose per office note on 02/29/2020: MTX 6 tablets p.o. weekly Dx: Rheumatoid arthritis involving multiple sites with positive rheumatoid factor   Okay to refill MTX?  

## 2020-05-23 NOTE — Telephone Encounter (Signed)
Patient advised Sherron Ales, PA-C would recommend for the patient to follow the standard precautions recommended by the CDC.  She may feel more comfortable if she wears a mask if she is unable to fully socially distance.  Patient advised no further booster vaccine doses have been approved yet. Please advise the patient to hold MTX and rinvoq if she develops signs or symptoms of an infection and to resume once the infection has completely cleared. Patient advised to notify us if she does test positive in the future further recommendations. Patient expressed understanding.

## 2020-05-23 NOTE — Telephone Encounter (Signed)
Patient called stating she is at the beach and just found out her grandson who tested positive will be arriving on 05/30/19 (which is his 10th day).  Patient is requesting a return call to let her know if it is dangerous for her to be around him.    Patient is also asking if there are any additional booster vaccines.  Patient states she had her last booster on 12/30/19.

## 2020-05-23 NOTE — Telephone Encounter (Signed)
I would recommend for the patient to follow the standard precautions recommended by the CDC.  She may feel more comfortable if she wears a mask if she is unable to fully socially distance.   No further booster vaccine doses have been approved yet. Please advise the patient to hold MTX and rinvoq if she develops signs or symptoms of an infection and to resume once the infection has completely cleared. Please advise the patient to notify us if she does test positive in the future further recommendations.

## 2020-06-04 ENCOUNTER — Telehealth: Payer: Self-pay

## 2020-06-04 ENCOUNTER — Ambulatory Visit: Payer: Medicare Other | Admitting: Family Medicine

## 2020-06-04 NOTE — Telephone Encounter (Signed)
It is the patients decision if she would like to return home or stay at the beach house.  Strongly encourage patient to continue to wear mask and wash hands. She should avoid any non-essential visits or appointments at this time.   I would recommend getting tested if she develops signs or symptoms of covid.  She should hold all immunosuppressive agents if she develops an infection.

## 2020-06-04 NOTE — Telephone Encounter (Signed)
Please advise 

## 2020-06-04 NOTE — Telephone Encounter (Signed)
Patient feels fine, patient verbalized understanding.

## 2020-06-04 NOTE — Telephone Encounter (Signed)
Patient called stating she is at the beach with a friend and they just found out the massage therapist that her friend saw on Saturday, 06/02/20 tested positive for COVID today 06/04/20.  [name] states she is scheduled to be at the beach with her friend til the end of the month.  Patient is requesting a return call to let her know if she should leave early due to her compromised immune system.  [name] states her friend and herself have had the booster vaccine, but doesn't want to put herself at risk.

## 2020-06-05 ENCOUNTER — Telehealth: Payer: Self-pay

## 2020-06-05 NOTE — Telephone Encounter (Signed)
Patient advised we do not have any instructions from ACR or CDC about second booster vaccine.  Patient advised we strongly encourage patient to continue to wear mask and wash hands. Patient advised she should avoid any non-essential visits or appointments at this time.   Patient advised would recommend getting tested if she develops signs or symptoms of covid.  She should hold all immunosuppressive agents if she develops an infection. Patient expressed understanding.

## 2020-06-05 NOTE — Telephone Encounter (Signed)
Patient called stating she heard on the news this morning that there is a 2nd booster vaccine.  Patient is requesting a return call to let her know if that is true.

## 2020-06-05 NOTE — Telephone Encounter (Signed)
We do not have any instructions from ACR or CDC about second booster vaccine.

## 2020-06-13 ENCOUNTER — Telehealth: Payer: Self-pay | Admitting: Pharmacist

## 2020-06-13 NOTE — Progress Notes (Signed)
Chronic Care Management Pharmacy Assistant   Name: Sherri [name]  MRN: [id number] DOB: [birth year]  Reason for Encounter: General Adherence Call   PCP : Sherri Blase, MD  Allergies:   Allergies  Allergen Reactions   Darvon [Propoxyphene Hcl] Shortness Of Breath    ? Dose Related ? Lowered respirations greatly   Meperidine Hcl Shortness Of Breath   Penicillins Hives and Other (See Comments)    Has patient had a PCN reaction causing immediate rash, facial/tongue/throat swelling, SOB or lightheadedness with hypotension: No SEVERE RASH INVOLVING MUCUS MEMBRANES or SKIN NECROSIS: #  #  #  YES  #  #  #  Has patient had a PCN reaction that required hospitalization No Has patient had a PCN reaction occurring within the last 10 years: No.    Azathioprine Other (See Comments)    Weakness   Atorvastatin Nausea And Vomiting   Demerol [Meperidine]     Other reaction(s): Respiratory Distress   Leflunomide     unknown   Ezetimibe-Simvastatin Nausea And Vomiting    Medications: Outpatient Encounter Medications as of 06/13/2020  Medication Sig   atorvastatin (LIPITOR) 10 MG tablet TAKE 1 TAB ON MONDAYS AND FRIDAY   benzonatate (TESSALON PERLES) 100 MG capsule Take 1-2 capsules (100-200 mg total) by mouth 3 (three) times daily as needed for cough.   Biotin 5000 MCG TABS Take 5,000 mcg by mouth daily.    Calcium Carbonate-Vitamin D (CALCIUM + D PO) Take 1 tablet by mouth daily.    Carboxymethylcellul-Glycerin (LUBRICATING EYE DROPS OP) Apply 1 drop to eye daily as needed (dry eyes).   carvedilol (COREG) 6.25 MG tablet TAKE 1 TABLET (6.25 MG TOTAL) BY MOUTH 2 (TWO) TIMES DAILY WITH A MEAL.   Cholecalciferol (VITAMIN D3) 2000 units capsule Take 2,000 Units by mouth daily.   ciprofloxacin (CIPRO) 500 MG tablet Take 1 tablet (500 mg total) by mouth 2 (two) times daily.   Cyanocobalamin (VITAMIN B-12 PO) Take 1 tablet by mouth daily.   denosumab (PROLIA) 60 MG/ML SOSY  injection INJECT 60 MG INTO THE SKIN EVERY 6 (SIX) MONTHS.   diazepam (VALIUM) 5 MG tablet Take 1 tablet (5 mg total) by mouth at bedtime.   diclofenac sodium (VOLTAREN) 1 % GEL APPLY 3 GRAMS TO 3 LARGE JOINTS 3 TIMES A DAY AS NEEDED   famotidine (PEPCID) 40 MG tablet TAKE 1 TABLET BY MOUTH EVERY DAY   fluconazole (DIFLUCAN) 150 MG tablet Take 1 tablet (150 mg total) by mouth every 3 (three) days.   folic acid (FOLVITE) 1 MG tablet TAKE 2 TABLETS BY MOUTH EVERY DAY   furosemide (LASIX) 20 MG tablet TAKE 1 TABLET BY MOUTH EVERY DAY   methotrexate (RHEUMATREX) 2.5 MG tablet TAKE 6 TABLETS BY MOUTH ONCE WEEKLY. CAUTION:CHEMOTHERAPY. PROTECT FROM LIGHT.   mupirocin ointment (BACTROBAN) 2 % Apply 1 application topically 2 (two) times daily.   nitrofurantoin, macrocrystal-monohydrate, (MACROBID) 100 MG capsule Take 1 capsule (100 mg total) by mouth 2 (two) times daily.   nitrofurantoin, macrocrystal-monohydrate, (MACROBID) 100 MG capsule Take 1 capsule (100 mg total) by mouth 2 (two) times daily.   oxyCODONE (ROXICODONE) 5 MG immediate release tablet Take 1 tablet (5 mg total) by mouth every 4 (four) hours as needed for severe pain.   Oxycodone HCl 10 MG TABS Take 1 tablet by mouth at bedtime as needed   Polyethylene Glycol 3350 8.5 g PACK Take 1 packet by mouth 2 (two) times daily as needed.  predniSONE (DELTASONE) 5 MG tablet TAKE 1 TABLET BY MOUTH EVERY DAY WITH BREAKFAST   Pyridoxine HCl (VITAMIN B-6 PO) Take 1 tablet by mouth daily.   traMADol (ULTRAM) 50 MG tablet Take 1 tablet (50 mg total) by mouth 2 (two) times daily.   triamcinolone (KENALOG) 0.1 % paste Use as directed 1 application in the mouth or throat 2 (two) times daily.   Upadacitinib ER ([name]) 15 MG TB24 Take 15 mg by mouth daily.   No facility-administered encounter medications on file as of 06/13/2020.    Current Diagnosis: Patient Active Problem List   Diagnosis Date Noted   Dark urine 04/10/2020    Mouth sore 04/10/2020   Abnormal urine odor 04/10/2020   Dehydration 04/10/2020   Restless leg 02/02/2020   Bilateral leg edema 01/11/2020   Cellulitis of right upper extremity 10/26/2019   Cervical radiculopathy 06/28/2019   Carpal tunnel syndrome on both sides 06/28/2019   Bilateral cataracts 01/11/2019   Dizziness 01/11/2019   Postoperative nausea and vomiting 01/11/2019   Right sided sciatica 04/14/2018   Trochanteric bursitis of right hip 04/14/2018   S/P cervical spinal fusion 11/10/2017   Dysphagia 10/26/2017   Gastroesophageal reflux disease 08/06/2017   Easy bruising 08/06/2017   Anxiety 04/24/2017   Mid back pain 03/26/2017   Dyspnea on exertion 03/14/2017   Hyperkalemia 03/13/2017   Submandibular gland swelling 01/08/2017   High risk medication use 09/02/2016   Piriformis muscle pain 09/02/2016   Weakness 07/22/2016   Right hip pain 04/08/2016   Right knee pain 02/25/2016   Dyspepsia 04/05/2015   Numbness 11/14/2014   Biceps tendon tear 10/10/2014   Insomnia 10/10/2014   Gait disturbance 06/13/2014   Depression with anxiety 06/13/2014   OP (osteoporosis) 01/18/2014   Other long term (current) drug therapy 01/18/2014   Other dysphagia 12/29/2013   Hoarseness 12/29/2013   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 01/01/2013   Personal history of colonic polyps 07/03/2010   Nocturia 05/28/2010   THYROID NODULE 05/17/2008   Multiple sclerosis ([place]) 05/17/2008   Other fatigue 03/30/2007   Hyperlipidemia 08/20/2006   Essential hypertension 08/20/2006   Rheumatoid arthritis ([place]) 08/20/2006   CERVICAL CANCER, HX OF 08/20/2006   GASTRIC ULCER, HX OF 08/20/2006    Goals Addressed   None     Follow-Up:  Pharmacist Review    A general adherence call was made to Sherri [name] to ask how she has been doing since she last spoke with the clinical pharmacist Sherri [name]. The patient states that she has been going well. She  states that her only concern right now is taking another booster shot. The patient states that she went to the beach and that so many people there had some form of covid. She states that she does not believe that she came in contact with anyone who had it but she thinks that she may need another booster shot.  The patient would like to know will it be okay if she received another booster.I let the patient know that I will pass along the information to the clinical pharmacist Sherri [name].   Wendy Poet, [place] [phone]

## 2020-06-14 NOTE — Progress Notes (Signed)
Office Visit Note  Patient: Sherri [name]             Date of Birth: [birth year]           MRN: [id number]             PCP: Eunice Blase, MD Referring: Binnie Rail, MD Visit Date: 06/27/2020 Occupation: @GUAROCC @  Subjective:  Pain in multiple joints  History of Present Illness: Sherri [name] is a 80 y.o. female with history of seropositive rheumatoid arthritis, osteoarthritis, and osteoporosis.  She is taking RIinvoq 15 mg 1 tablet by mouth daily, methotrexate 6 tablets by mouth once weekly, folic acid 1 mg a mouth daily, and prednisone 5 mg 1 tablet by mouth daily. She denies any recent rheumatoid arthritis flares. She continues to have chronic pain in both hands and both feet. She has intermittent swelling in both hands. She has had persistent discomfort in the left hip which was replaced by Dr. Mayer Camel in the past. She has not had any recent injuries or falls. She takes oxycodone at bedtime as needed for severe pain but has been trying to take it very sparingly. She has instead been taking tramadol at bedtime for pain relief which has been managing most of her discomfort. According to the patient she was experience recurrent UTIs in November/December 2021. She was following along with Dr. Junius Roads closely and has not had any recurrence recently. She continues to take a calcium and vitamin D supplement. She is due for her next Prolia injection.      Activities of Daily Living:  Patient reports morning stiffness for 4 hours.   Patient Reports nocturnal pain.  Difficulty dressing/grooming: Denies Difficulty climbing stairs: Denies Difficulty getting out of chair: Denies Difficulty using hands for taps, buttons, cutlery, and/or writing: Reports  Review of Systems  Constitutional: Positive for fatigue.  HENT: Negative for mouth sores, mouth dryness and nose dryness.   Eyes: Positive for dryness. Negative for pain, itching and visual disturbance.  Respiratory: Negative for  cough, hemoptysis, shortness of breath and difficulty breathing.   Cardiovascular: Positive for swelling in legs/feet. Negative for chest pain and palpitations.  Gastrointestinal: Positive for constipation. Negative for abdominal pain, blood in stool and diarrhea.  Endocrine: Negative for increased urination.  Genitourinary: Negative for painful urination.  Musculoskeletal: Positive for arthralgias, joint pain, joint swelling, myalgias, muscle weakness, morning stiffness, muscle tenderness and myalgias.  Skin: Negative for color change, rash and redness.  Allergic/Immunologic: Negative for susceptible to infections.  Neurological: Positive for numbness and weakness. Negative for dizziness, headaches and memory loss.  Hematological: Positive for bruising/bleeding tendency. Negative for swollen glands.  Psychiatric/Behavioral: Positive for sleep disturbance. Negative for confusion.    PMFS History:  Patient Active Problem List   Diagnosis Date Noted  . Dark urine 04/10/2020  . Mouth sore 04/10/2020  . Abnormal urine odor 04/10/2020  . Dehydration 04/10/2020  . Restless leg 02/02/2020  . Bilateral leg edema 01/11/2020  . Cellulitis of right upper extremity 10/26/2019  . Cervical radiculopathy 06/28/2019  . Carpal tunnel syndrome on both sides 06/28/2019  . Bilateral cataracts 01/11/2019  . Dizziness 01/11/2019  . Postoperative nausea and vomiting 01/11/2019  . Right sided sciatica 04/14/2018  . Trochanteric bursitis of right hip 04/14/2018  . S/P cervical spinal fusion 11/10/2017  . Dysphagia 10/26/2017  . Gastroesophageal reflux disease 08/06/2017  . Easy bruising 08/06/2017  . Anxiety 04/24/2017  . Mid back pain 03/26/2017  . Dyspnea on exertion 03/14/2017  .  Hyperkalemia 03/13/2017  . Submandibular gland swelling 01/08/2017  . High risk medication use 09/02/2016  . Piriformis muscle pain 09/02/2016  . Weakness 07/22/2016  . Right hip pain 04/08/2016  . Right knee pain  02/25/2016  . Dyspepsia 04/05/2015  . Numbness 11/14/2014  . Biceps tendon tear 10/10/2014  . Insomnia 10/10/2014  . Gait disturbance 06/13/2014  . 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 OP (osteoporosis) 01/18/2014  . Other long term (current) drug therapy 01/18/2014  . Other dysphagia 12/29/2013  . Hoarseness 12/29/2013  . 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 Personal history of colonic polyps 07/03/2010  . Nocturia 05/28/2010  . THYROID NODULE 05/17/2008  . Multiple sclerosis ([place]) 05/17/2008  . Other fatigue 03/30/2007  . Hyperlipidemia 08/20/2006  . Essential hypertension 08/20/2006  . Rheumatoid arthritis ([place]) 08/20/2006  . CERVICAL CANCER, HX OF 08/20/2006  . GASTRIC ULCER, HX OF 08/20/2006    Past Medical History:  Diagnosis Date  . Anxiety    takes Valium daily as needed  . Basal cell carcinoma 01/21/1988   left nostril (MOHS), sup-right calf (CX35FU)  . Basal cell carcinoma 03/22/1996   right calf (CX35FU)  . Basal cell carcinoma 03/31/1995   left wing nose  . Bruises easily    d/t meds  . Cancer ([place])    basal cell ca, in situ- uterine   . Cataracts, bilateral   . Chronic back pain   . Dizziness    r/t to meds  . GERD (gastroesophageal reflux disease)    no meds on a regular basis but will take Tums if needed  . Headache(784.0)    r/t neck issues  . History of bronchitis 6-56yrs ago  . History of colon polyps    benign  . Hyperlipidemia    takes Atorvastatin on Mondays and Fridays  . Hypertension    takes Ramipril daily  . Joint pain   . Joint swelling   . Multiple sclerosis ([place])   . Neuromuscular disorder ([place])    Dr. Park Liter- [facility], follows M.S.  . Nocturia   . OA (osteoarthritis)    Dr Jani Gravel weekly, RA- hands- knees- feet   . PONV (postoperative nausea and vomiting)    trouble urinating after surgery in 2014  . Postoperative nausea and vomiting 01/11/2019  . Rheumatoid arthritis(714.0)    Dr  Estanislado Pandy takes [name] daily  . Right wrist fracture   . Skin cancer   . Squamous cell carcinoma of skin 11/02/2001   in situ-right knee (cx66fu)  . Squamous cell carcinoma of skin 11/12/2011   in situ-left forearm (CX35FU), in situ-left foot (CX35FU)  . Squamous cell carcinoma of skin 01/22/2012   in situ-left lower forearm (txpbx)  . Squamous cell carcinoma of skin 11/17/2012   right shin (txpbx)  . Squamous cell carcinoma of skin 10/28/2013   in situ-right shoulder (CX35FU)  . Squamous cell carcinoma of skin 04/28/2014   well diff-right shin (txpbx)  . Squamous cell carcinoma of skin 11/02/2014   in situ-Left shin (txpbx)  . Squamous cell carcinoma of skin 12/15/2014   in situ-Left hand (txpbx), bowens-left side chest (txpbx)  . Squamous cell carcinoma of skin 05/09/2015   in situ-left hand (txpbx)   . Squamous cell carcinoma of skin 05/07/2016   in situ-left inner shin,ant, in situ-left inner shin, post, in situ-left outer forearm, in situ-right knuckle  . Squamous cell carcinoma of skin 03/1302018   in situ-left shoulder (txpbx), in situ-right inner shin (txpbx),  in situ-top of left foot (txpbx), KA- left forearm (txpbx)  . Squamous cell carcinoma of skin 12/02/2016   in situ-left inner shin (txpbx)  . Squamous cell carcinoma of skin 03/04/2017   in situ-left outer sup, shin (txpbx), in situ- right 2nd knuckle finger (txpbx), in situ- Left wrist (txpbx)  . Squamous cell carcinoma of skin 04/06/2018   in situ-above left knee inner (txpbx)  . Squamous cell carcinoma of skin 04/21/2018   in situ-right top hand (txpbx)  . Squamous cell carcinoma of skin 07/08/2018   in situ-right lower inner shin (txpbx)  . Squamous cell carcinoma of skin 04/27/2019   in situ- Left neck(CX35FU), In situ- right neck (CX35FU)  . Urinary retention    sees Dr.Wrenn about 2 times a yr    Family History  Problem Relation Age of Onset  . Cancer Mother        pancreatic  . Diabetes Mother   .  Heart disease Father        Rheumatic  . Cancer Father        ? stomach  . Cancer Sister        stomach  . Cancer Maternal Aunt        X 4; ? primary  . Heart disease Paternal Aunt   . Cancer Maternal Grandmother        cervical  . Colon cancer Other        Aunts  . Multiple sclerosis Daughter   . Hypertension Neg Hx   . Stroke Neg Hx    Past Surgical History:  Procedure Laterality Date  . ABDOMINAL HYSTERECTOMY     1985  . APPENDECTOMY     with TAH  . BACK SURGERY     several  . BREAST BIOPSY Right    Results were negative  . BROW LIFT Bilateral 10/02/2016   Procedure: BILATERAL LOWER LID BLEPHAROPLASTY;  Surgeon: Clista Bernhardt, MD;  Location: [facility];  Service: Plastics;  Laterality: Bilateral;  . CARPAL TUNNEL RELEASE Right   . cataracts    . CERVICAL FUSION     Dr Joya Salm  . CERVICAL FUSION  12/31/2012   Dr Joya Salm  . CHEST TUBE INSERTION     for traumatic Pneumothorax  . COLONOSCOPY  07/16/2010   normal   . COLONOSCOPY W/ POLYPECTOMY  1997   negative since; Dr Deatra Ina  . ESOPHAGOGASTRODUODENOSCOPY  07/16/2010   normal  . eye lid raise    . EYE SURGERY Bilateral    cataracts removed - /w IOL  . JOINT REPLACEMENT    . LUMBAR FUSION     Dr Joya Salm  . NASAL SINUS SURGERY    . POSTERIOR CERVICAL FUSION/FORAMINOTOMY N/A 12/31/2012   Procedure: POSTERIOR LATERAL CERVICAL FUSION/FORAMINOTOMY LEVEL 1 CERVICAL THREE-FOUR WITH LATERAL MASS SCREWS;  Surgeon: Floyce Stakes, MD;  Location: [facility];  Service: Neurosurgery;  Laterality: N/A;  . PTOSIS REPAIR Bilateral 10/02/2016   Procedure: INTERNAL PTOSIS REPAIR;  Surgeon: Clista Bernhardt, MD;  Location: [facility];  Service: Plastics;  Laterality: Bilateral;  . SKIN BIOPSY    . THYROID SURGERY     R lobe removed- 1972, has grown back - CT last done- 2018  . TOTAL HIP ARTHROPLASTY  12/22/2011   Procedure: TOTAL HIP ARTHROPLASTY;  Surgeon: Kerin Salen, MD;  Location: [facility];  Service: Orthopedics;  Laterality: Left;  .  TOTAL SHOULDER ARTHROPLASTY    . TUBAL LIGATION     Social History  Social History Narrative  . Not on file   Immunization History  Administered Date(s) Administered  . Fluad Quad(high Dose 65+) 02/02/2020  . Influenza Whole 02/22/2007, 02/08/2013  . Influenza, High Dose Seasonal PF 02/04/2015, 02/18/2017, 02/21/2018, 01/16/2019  . Influenza-Unspecified 01/24/2014, 01/25/2016, 02/18/2017, 01/16/2019  . PFIZER(Purple Top)SARS-COV-2 Vaccination 06/18/2019, 07/09/2019, 01/09/2020  . Pneumococcal Conjugate-13 11/23/2012, 09/08/2014  . Pneumococcal Polysaccharide-23 05/26/2006  . Pneumococcal-Unspecified 12/28/2012  . Td 05/28/2010  . Zoster 05/26/2009  . Zoster Recombinat (Shingrix) 02/10/2019, 04/14/2019     Objective: Vital Signs: BP (!) 159/89 (BP Location: Left Arm, Patient Position: Sitting, Cuff Size: Normal)   Pulse (!) 57   Ht 5' (1.524 m)   Wt 120 lb 3.2 oz (54.5 kg)   BMI 23.47 kg/m    Physical Exam Vitals and nursing note reviewed.  Constitutional:      Appearance: She is well-developed and well-nourished.  HENT:     Head: Normocephalic and atraumatic.  Eyes:     Extraocular Movements: EOM normal.     Conjunctiva/sclera: Conjunctivae normal.  Cardiovascular:     Pulses: Intact distal pulses.  Pulmonary:     Effort: Pulmonary effort is normal.  Abdominal:     Palpations: Abdomen is soft.  Musculoskeletal:     Cervical back: Normal range of motion.  Skin:    General: Skin is warm and dry.     Capillary Refill: Capillary refill takes less than 2 seconds.  Neurological:     Mental Status: She is alert and oriented to person, place, and time.  Psychiatric:        Mood and Affect: Mood and affect normal.        Behavior: Behavior normal.      Musculoskeletal Exam: C-spine limited ROM.  Painful ROM of lumbar spine.  Shoulder joints, elbow joints, wrist joints, MCPs, PIPs, and DIPs good ROM with no discomfort.  Synovial thickening of all MCP joints but no  synovitis noted. PIP and DIP thickening consistent with osteoarthritis of both hands.  Tenderness and synovitis of left 4th PIP joint.  Painful ROM of left hip replacement.  Right hip has good ROM with no discomfort.  Knee joints good ROM with no warmth or effusion.  Ankle joints good ROM with no tenderness or swelling.   CDAI Exam: CDAI Score: 3  Patient Global: 6 mm; Provider Global: 4 mm Swollen: 1 ; Tender: 1  Joint Exam 06/27/2020      Right  Left  PIP 4     Swollen Tender     Investigation: No additional findings.  Imaging: No results found.  Recent Labs: Lab Results  Component Value Date   WBC 6.8 04/10/2020   HGB 12.7 04/10/2020   PLT 266.0 04/10/2020   NA 131 (L) 04/10/2020   K 3.8 04/10/2020   CL 94 (L) 04/10/2020   CO2 29 04/10/2020   GLUCOSE 109 (H) 04/10/2020   BUN 13 04/10/2020   CREATININE 1.08 04/10/2020   BILITOT 0.7 04/10/2020   ALKPHOS 29 (L) 04/10/2020   AST 29 04/10/2020   ALT 18 04/10/2020   PROT 7.0 04/10/2020   ALBUMIN 4.2 04/10/2020   CALCIUM 9.3 04/10/2020   GFRAA 67 02/29/2020   QFTBGOLDPLUS NEGATIVE 10/04/2019    Speciality Comments: Prior therapy includes: Health and safety inspector (cost), [name] (cost)   Procedures:  No procedures performed Allergies: Darvon [propoxyphene hcl], Meperidine hcl, Penicillins, Azathioprine, Atorvastatin, Demerol [meperidine], Leflunomide, and Ezetimibe-simvastatin   Assessment / Plan:     Visit Diagnoses: Rheumatoid arthritis involving  multiple sites with positive rheumatoid factor ([place]): She has mild tenderness and synovitis of the left fourth PIP joint on examination today.  She continues to experience chronic pain in both hands and both feet but overall her rheumatoid arthritis has been well controlled on the current treatment regimen.  She has been taking [name] 15 mg 1 tablet by mouth daily, methotrexate 6 tablets by mouth once weekly, folic acid 1 mg by mouth daily, and prednisone 5 mg 1 tablet by mouth daily.  She has  been tolerating his medications without any side effects.  She has been taking tramadol as needed at bedtime for pain relief.  Overall her arthritis seems well controlled on the current treatment regimen.  No medication changes will be made at this time.  She was advised to notify us if she develops increased joint pain or joint swelling.  She will follow-up in the office in 5 months.   High risk medication use - [name] 15 mg 1 tablet by mouth daily, Methotrexate 6 tablets by mouth once weekly, folic acid 1 mg p.o. daily, prednisone 5 mg 1 tablet by mouth daily. (inadequate response to olumiant)  CBC and CMP were drawn on 04/10/2020.  She is due to update lab work.  Orders for CBC and CMP were released.  Her next lab work will be due in May and every 3 months to monitor for drug toxicity.  TB gold negative on 10/04/2019.  Future order for TB gold was placed today. - Plan: CBC with Differential/Platelet, COMPLETE METABOLIC PANEL WITH GFR, QuantiFERON-TB Gold Plus Discussed the importance of holding rinvoq and MTX if she develops signs or symptoms of an infection and to resume once the infection has completely cleared. She was advised to notify us if she has any recurrence of UTIs. She has received three [place] COVID-19 vaccinations. She was encouraged to receive the fourth dose. She was advised to hold methotrexate and written Volk 1 week after receiving the fourth dose. She voiced understanding. She was advised to notify us or her PCP if she develops a COVID-19 infection in order to try to receive the monoclonal antibody infusion.  Screening for tuberculosis -Order for TB gold placed today. Plan: QuantiFERON-TB Gold Plus  Primary osteoarthritis of right hip: She has good ROM with no discomfort.   History of total hip replacement, left - Dr. Rowan-Chronic pain.  She experiences increased discomfort when rising from a seated position.  She was advised to follow up with Dr. Mayer Camel if her discomfort persists  or worsens or if she develops any instability.   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: Chronic pain. Limited ROM with lateral rotation to the right. She has been taking tramadol at bedtime as needed for pain relief.  DDD (degenerative disc disease), lumbar: Status post fusion. Chronic pain. She takes tramadol at bedtime as needed for moderate pain.  If her pain is severe she takes oxycodone at bedtime instead of tramadol. She has been try to take oxycodone very sparingly.  Age-related osteoporosis without current pathological fracture -DEXA updated on: January 11, 2018 showed a T score of -3.9.  According to the patient she had an updated DEXA in 2021, so we will call her PCPs office to obtain records.  She takes calcium and vitamin D as recommended.  Her last prolia injection was on 12/08/19.  She is due for her next prolia injection.  CBC, CMP, and vitamin D level will be checked today prior to scheduling her injection.     -  Plan: VITAMIN D 25 Hydroxy (Vit-D Deficiency, Fractures)  Closed fracture of right wrist with routine healing, subsequent encounter -H/o Plan: VITAMIN D 25 Hydroxy (Vit-D Deficiency, Fractures)  Vitamin D deficiency -Vitamin D level will be checked today prior to scheduling her next prolia injection. She has been taking a calcium and vitamin D supplement daily.  Plan: VITAMIN D 25 Hydroxy (Vit-D Deficiency, Fractures)  Other fatigue: Chronic and secondary to insomnia.   Primary insomnia: She experiences nocturnal pain which worsens her insomnia.  She has been taking tramadol at bedtime for pain relief.   Other medical conditions are listed as follows:  History of depression  History of hyperlipidemia  History of multiple sclerosis ([place])  History of anxiety  History of hypertension      Orders: Orders Placed This Encounter  Procedures  . CBC with Differential/Platelet  . COMPLETE METABOLIC PANEL WITH GFR  . QuantiFERON-TB Gold Plus  . VITAMIN D 25  Hydroxy (Vit-D Deficiency, Fractures)   No orders of the defined types were placed in this encounter.    Follow-Up Instructions: Return in about 5 months (around 11/24/2020) for Rheumatoid arthritis, Osteoarthritis, Osteoporosis.   Ofilia Neas, PA-C  Note - This record has been created using Dragon software.  Chart creation errors have been sought, but may not always  have been located. Such creation errors do not reflect on  the standard of medical care.

## 2020-06-15 ENCOUNTER — Telehealth: Payer: Self-pay

## 2020-06-15 NOTE — Telephone Encounter (Signed)
I called and advised the patient of this.

## 2020-06-15 NOTE — Telephone Encounter (Signed)
Whether or not to get a booster would be up to her.

## 2020-06-15 NOTE — Telephone Encounter (Signed)
Contacted patient to discuss second COVID booster - she got the booster in August and heard some people are getting a 2nd booster after 5 months. Discussed data at this point is limited for a 2nd booster (4th shot total). Pt may get the booster at her discretion, but right now we cannot say that it helps immunity/disease prevention.

## 2020-06-15 NOTE — Telephone Encounter (Signed)
Please advise 

## 2020-06-15 NOTE — Telephone Encounter (Signed)
Pt called asking you discussed with Dr. Junius Roads if she should get the 2 Covid Booster Shot ??  She said that she received her Booster aug the 8th . And heard they are giving out a 2nd dose

## 2020-06-17 ENCOUNTER — Other Ambulatory Visit: Payer: Self-pay | Admitting: Rheumatology

## 2020-06-18 MED ORDER — PREDNISONE 5 MG PO TABS: ORAL_TABLET | ORAL | 0 refills | Status: DC

## 2020-06-18 NOTE — Addendum Note (Signed)
Addended by: Carole Binning on: 06/18/2020 01:23 PM   Modules accepted: Orders

## 2020-06-18 NOTE — Telephone Encounter (Signed)
Last Visit: 02/29/2020 Next Visit: 06/27/2020  Current Dose per office note on 02/29/2020, prednisone 5 mg p.o. daily. (inadequate response to olumiant) Dx: Rheumatoid arthritis involving multiple sites with positive rheumatoid factor   Okay to refill Prednisone?

## 2020-06-19 ENCOUNTER — Encounter: Payer: Self-pay | Admitting: Nurse Practitioner

## 2020-06-20 ENCOUNTER — Encounter: Payer: Self-pay | Admitting: Family Medicine

## 2020-06-22 ENCOUNTER — Ambulatory Visit: Payer: Medicare Other | Admitting: Physician Assistant

## 2020-06-27 ENCOUNTER — Encounter: Payer: Self-pay | Admitting: Physician Assistant

## 2020-06-27 ENCOUNTER — Telehealth: Payer: Self-pay

## 2020-06-27 ENCOUNTER — Ambulatory Visit: Payer: Medicare Other | Admitting: Physician Assistant

## 2020-06-27 ENCOUNTER — Other Ambulatory Visit: Payer: Self-pay

## 2020-06-27 VITALS — BP 159/89 | HR 57 | Ht 60.0 in | Wt 120.2 lb

## 2020-06-27 DIAGNOSIS — Z8679 Personal history of other diseases of the circulatory system: Secondary | ICD-10-CM

## 2020-06-27 DIAGNOSIS — S62101D Fracture of unspecified carpal bone, right wrist, subsequent encounter for fracture with routine healing: Secondary | ICD-10-CM

## 2020-06-27 DIAGNOSIS — Z96642 Presence of left artificial hip joint: Secondary | ICD-10-CM | POA: Diagnosis not present

## 2020-06-27 DIAGNOSIS — M1611 Unilateral primary osteoarthritis, right hip: Secondary | ICD-10-CM

## 2020-06-27 DIAGNOSIS — Z79899 Other long term (current) drug therapy: Secondary | ICD-10-CM

## 2020-06-27 DIAGNOSIS — E559 Vitamin D deficiency, unspecified: Secondary | ICD-10-CM | POA: Diagnosis not present

## 2020-06-27 DIAGNOSIS — F5101 Primary insomnia: Secondary | ICD-10-CM | POA: Diagnosis not present

## 2020-06-27 DIAGNOSIS — M0579 Rheumatoid arthritis with rheumatoid factor of multiple sites without organ or systems involvement: Secondary | ICD-10-CM | POA: Diagnosis not present

## 2020-06-27 DIAGNOSIS — Z8659 Personal history of other mental and behavioral disorders: Secondary | ICD-10-CM

## 2020-06-27 DIAGNOSIS — R5383 Other fatigue: Secondary | ICD-10-CM | POA: Diagnosis not present

## 2020-06-27 DIAGNOSIS — M810 Age-related osteoporosis without current pathological fracture: Secondary | ICD-10-CM

## 2020-06-27 DIAGNOSIS — M4312 Spondylolisthesis, cervical region: Secondary | ICD-10-CM | POA: Diagnosis not present

## 2020-06-27 DIAGNOSIS — M5136 Other intervertebral disc degeneration, lumbar region: Secondary | ICD-10-CM

## 2020-06-27 DIAGNOSIS — G35 Multiple sclerosis: Secondary | ICD-10-CM

## 2020-06-27 DIAGNOSIS — Z8639 Personal history of other endocrine, nutritional and metabolic disease: Secondary | ICD-10-CM | POA: Diagnosis not present

## 2020-06-27 DIAGNOSIS — Z111 Encounter for screening for respiratory tuberculosis: Secondary | ICD-10-CM

## 2020-06-27 NOTE — Telephone Encounter (Signed)
Pharmacy Benefit- ran test claim, no prior Josem Kaufmann is required.  Patient's copay is $300. She paid a similar amount last year January. There are no grants open and patient's income in the past was too high for patient assistance.

## 2020-06-27 NOTE — Patient Instructions (Signed)
Standing Labs We placed an order today for your standing lab work.   Please have your standing labs drawn in May and every 3 months   If possible, please have your labs drawn 2 weeks prior to your appointment so that the provider can discuss your results at your appointment.  We have open lab daily Monday through Thursday from 8:30-12:30 PM and 1:30-4:30 PM and Friday from 8:30-12:30 PM and 1:30-4:00 PM at the office of Dr. Shaili Deveshwar, [facility].   Please be advised, all patients with office appointments requiring lab work will take precedents over walk-in lab work.  If possible, please come for your lab work on Monday and Friday afternoons, as you may experience shorter wait times. The office is located at [address], [address], , [place] [address] No appointment is necessary.   Labs are drawn by [facility]. Please bring your co-pay at the time of your lab draw.  You may receive a bill from [facility] for your lab work.  If you wish to have your labs drawn at another location, please call the office 24 hours in advance to send orders.  If you have any questions regarding directions or hours of operation,  please call [phone].   As a reminder, please drink plenty of water prior to coming for your lab work. Thanks!   

## 2020-06-27 NOTE — Telephone Encounter (Signed)
Discussed with patient that Prolia copay is $300 and no grants are currently open. She is amenable to paying since she is overdue for Prolia. Labs drawn today - awaiting results prior to sending rx to [facility]  Knox Saliva, PharmD, MPH Clinical Pharmacist (Rheumatology and Pulmonology)

## 2020-06-27 NOTE — Telephone Encounter (Signed)
Please check status of prolia, per Hazel Sams, PA-C. Patient was due for prolia in January 2022. Thanks!

## 2020-06-28 ENCOUNTER — Telehealth: Payer: Self-pay | Admitting: Family Medicine

## 2020-06-28 ENCOUNTER — Ambulatory Visit: Payer: Medicare Other | Admitting: Physician Assistant

## 2020-06-28 ENCOUNTER — Encounter: Payer: Self-pay | Admitting: Physician Assistant

## 2020-06-28 ENCOUNTER — Other Ambulatory Visit: Payer: Self-pay

## 2020-06-28 DIAGNOSIS — D0471 Carcinoma in situ of skin of right lower limb, including hip: Secondary | ICD-10-CM | POA: Diagnosis not present

## 2020-06-28 DIAGNOSIS — Z85828 Personal history of other malignant neoplasm of skin: Secondary | ICD-10-CM | POA: Diagnosis not present

## 2020-06-28 DIAGNOSIS — D485 Neoplasm of uncertain behavior of skin: Secondary | ICD-10-CM

## 2020-06-28 DIAGNOSIS — L570 Actinic keratosis: Secondary | ICD-10-CM

## 2020-06-28 NOTE — Progress Notes (Signed)
MCV and MCH remain elevated.  Hemoglobin and hematocrit are WNL.  Folate was WNL on 06/20/19.  Reviewed lab work with Dr. Estanislado Pandy.   Please make sure the patient is taking folic acid 2 mg daily.  We will continue to monitor.  Rest of CBC stable.  Globulin is low.  Alk phos is low.  Please add phosphorus.  Vitamin D WNL.  Continue current vitamin D supplement.

## 2020-06-28 NOTE — Patient Instructions (Signed)

## 2020-06-28 NOTE — Telephone Encounter (Signed)
I called. She said she is getting MyChart messages coming from here, the most recent being an AVS. She has not been seen here recently and I see no messages from Dr. Junius Roads. The AVS was most likely from her visit with Hazel Sams, PA yesterday.

## 2020-06-28 NOTE — Telephone Encounter (Signed)
Pt called and said terri had called her. CB [phone]

## 2020-06-29 ENCOUNTER — Other Ambulatory Visit: Payer: Self-pay | Admitting: Physician Assistant

## 2020-06-29 LAB — COMPLETE METABOLIC PANEL WITH GFR
AG Ratio: 2.6 (calc) — ABNORMAL HIGH (ref 1.0–2.5)
ALT: 25 U/L (ref 6–29)
AST: 28 U/L (ref 10–35)
Albumin: 4.7 g/dL (ref 3.6–5.1)
Alkaline phosphatase (APISO): 23 U/L — ABNORMAL LOW (ref 37–153)
BUN: 15 mg/dL (ref 7–25)
CO2: 29 mmol/L (ref 20–32)
Calcium: 10 mg/dL (ref 8.6–10.4)
Chloride: 101 mmol/L (ref 98–110)
Creat: 0.86 mg/dL (ref 0.60–0.93)
GFR, Est African American: 75 mL/min/{1.73_m2} (ref >=60)
GFR, Est Non African American: 65 mL/min/{1.73_m2} (ref >=60)
Globulin: 1.8 g/dL (calc) — ABNORMAL LOW (ref 1.9–3.7)
Glucose, Bld: 94 mg/dL (ref 65–99)
Potassium: 4.9 mmol/L (ref 3.5–5.3)
Sodium: 140 mmol/L (ref 135–146)
Total Bilirubin: 0.6 mg/dL (ref 0.2–1.2)
Total Protein: 6.5 g/dL (ref 6.1–8.1)

## 2020-06-29 LAB — CBC WITH DIFFERENTIAL/PLATELET
Absolute Monocytes: 499 cells/uL (ref 200–950)
Basophils Absolute: 20 cells/uL (ref 0–200)
Basophils Relative: 0.5 %
Eosinophils Absolute: 0 cells/uL — ABNORMAL LOW (ref 15–500)
Eosinophils Relative: 0 %
HCT: 35.4 % (ref 35.0–45.0)
Hemoglobin: 12.4 g/dL (ref 11.7–15.5)
Lymphs Abs: 878 cells/uL (ref 850–3900)
MCH: 37.1 pg — ABNORMAL HIGH (ref 27.0–33.0)
MCHC: 35 g/dL (ref 32.0–36.0)
MCV: 106 fL — ABNORMAL HIGH (ref 80.0–100.0)
MPV: 9.4 fL (ref 7.5–12.5)
Monocytes Relative: 12.8 %
Neutro Abs: 2504 cells/uL (ref 1500–7800)
Neutrophils Relative %: 64.2 %
Platelets: 285 10*3/uL (ref 140–400)
RBC: 3.34 10*6/uL — ABNORMAL LOW (ref 3.80–5.10)
RDW: 14.7 % (ref 11.0–15.0)
Total Lymphocyte: 22.5 %
WBC: 3.9 10*3/uL (ref 3.8–10.8)

## 2020-06-29 LAB — TEST AUTHORIZATION

## 2020-06-29 LAB — PHOSPHORUS: Phosphorus: 4.3 mg/dL (ref 2.1–4.3)

## 2020-06-29 LAB — VITAMIN D 25 HYDROXY (VIT D DEFICIENCY, FRACTURES): Vit D, 25-Hydroxy: 89 ng/mL (ref 30–100)

## 2020-06-29 MED ORDER — PROLIA 60 MG/ML ~~LOC~~ SOSY: PREFILLED_SYRINGE | SUBCUTANEOUS | 1 refills | Status: DC

## 2020-06-29 MED FILL — PROLIA 60 MG/ML SOLN: 60 | 180 days supply | Qty: 1 | Fill #0

## 2020-06-29 NOTE — Progress Notes (Signed)
Phosphorus WNL.

## 2020-06-29 NOTE — Addendum Note (Signed)
Addended by: Cassandria Anger on: 06/29/2020 02:09 PM   Modules accepted: Orders

## 2020-06-29 NOTE — Telephone Encounter (Addendum)
Prolia prescription to [facility] with directions to courier to clinic address. Patient aware of copay.  Labs drawn 06/27/20 and clinically appropriate to proceed with Prolia.  Knox Saliva, PharmD, MPH Clinical Pharmacist (Rheumatology and Pulmonology)

## 2020-06-29 NOTE — Telephone Encounter (Signed)
Set up Prolia to courier to clinic early next week

## 2020-07-03 ENCOUNTER — Ambulatory Visit: Payer: Medicare Other | Admitting: Nurse Practitioner

## 2020-07-03 ENCOUNTER — Encounter: Payer: Self-pay | Admitting: Nurse Practitioner

## 2020-07-03 ENCOUNTER — Encounter: Payer: Self-pay | Admitting: Family Medicine

## 2020-07-03 ENCOUNTER — Telehealth: Payer: Self-pay | Admitting: *Deleted

## 2020-07-03 ENCOUNTER — Encounter: Payer: Self-pay | Admitting: Rheumatology

## 2020-07-03 ENCOUNTER — Telehealth: Payer: Self-pay

## 2020-07-03 ENCOUNTER — Encounter: Payer: Self-pay | Admitting: Internal Medicine

## 2020-07-03 VITALS — BP 130/60 | HR 72 | Ht 61.0 in | Wt 118.0 lb

## 2020-07-03 DIAGNOSIS — K5900 Constipation, unspecified: Secondary | ICD-10-CM

## 2020-07-03 DIAGNOSIS — R194 Change in bowel habit: Secondary | ICD-10-CM

## 2020-07-03 NOTE — Progress Notes (Signed)
Reviewed and agree with management plan.  Patrich Heinze T. Paitlyn Mcclatchey, MD FACG [phone]  

## 2020-07-03 NOTE — Telephone Encounter (Signed)
Received DEXA results from [facility]  Date of Scan: 01/16/2020 Lowest T-score and site measured: -4.2 Significant changes in BMD and site measured (5% and above):n/a  Current Regimen:Prolia   Recommendation:Continue Prolia and f/u 2 years

## 2020-07-03 NOTE — Telephone Encounter (Signed)
I returned patient's call.  She had her booster today.  She will restart prednisone and [name] a week later.  I advised if she starts having increased joint pain and swelling she may increase prednisone to 10 mg p.o. daily.  And once she restarts her medications and symptoms improve then she may reduce the dose of prednisone back to 5 mg p.o. daily.

## 2020-07-03 NOTE — Progress Notes (Signed)
ASSESSMENT AND PLAN    # 80 yo female with bowel changes. Constipation problems since November. Cannot have a BM without daily Miralax.and MOM which then produces mushy stools.  --stop Miralax and MOM.  --Trial of amitiza 24 mcg  --She doesn't drink much water, drinks caffeine containing beverages throughout the day. Advised more water intake. Caffeine has diuretic effect.   # Chronic GERD. No history of Barrett's esophagus on previous upper endoscopies. No heartburn on famotidine but followed by ENT for hoarse voice which she is concerned about,    # Family history of gastric cancer in sister and father. Patient requesting EGD to be done at time of colonoscopy. given family history of gastric cancer. No gastric neoplasms on EGD in 2004, 2007, nor 2012 but patient is concerned and wants an EGD to be done at time of colonoscopy.  --The risks and benefits of EGD were discussed and the patient agrees to proceed.   # Multiple sclerosis, stable per patient. Not on medication.  # RA, has chronic pain but was able to stop opioids a couple of months ago . Takes [name] and prednisone.   # Osteoporosis, on prolia  HISTORY OF PRESENT ILLNESS     Primary Gastroenterologist : Previously Dr. Deatra Ina Chief Complaint : consitpation  Sherri [name] is a 80 y.o. female with PMH / [place] significant for,  but not necessarily limited to: remote adenomatous colon polyps, GERD, RA, OA, osteoporosis, multiple sclerosis, anxiety, HTN, hyperlipidemia  Patient developed problems with constipation in November. She has been having infrequent BMs with straining. She cannot relate the bowel changes to any medication changes nor dietary changes. She eats a lot of fruit and salads.  No hasn't seen any blood in stool.   She requires daily Miralax and a daily capful of MOM just to have a BM and then stool comes out mushy and she still has to strain. She gets a small amount of fecal seepage when she urinates but this  has been going on for a few years. She has stress incontinence.   Patient's sister and father died of stomach cancer. She had 4 maternal aunts with colon cancer and several cousins with colon cancer but none of them under the age of 34 at time of diagnosis.   Ms. Odell gives a long standing history of GERD. She saw ENT a few months ago for hoarseness. He put her on Pepcid. She was having heartburn sometimes and the Pepcid has helped but didn't do much for her hoarse voice.     Previous Endoscopic Evaluations / Pertinent Studies:  February 2012 surveillance colonoscopy -  --complete exam, excellent prep --Normal exam  Past Medical History:  Diagnosis Date  . Anxiety    takes Valium daily as needed  . Basal cell carcinoma 01/21/1988   left nostril (MOHS), sup-right calf (CX35FU)  . Basal cell carcinoma 03/22/1996   right calf (CX35FU)  . Basal cell carcinoma 03/31/1995   left wing nose  . Bruises easily    d/t meds  . Cancer ([place])    basal cell ca, in situ- uterine   . Cataracts, bilateral   . Chronic back pain   . Dizziness    r/t to meds  . GERD (gastroesophageal reflux disease)    no meds on a regular basis but will take Tums if needed  . Headache(784.0)    r/t neck issues  . History of bronchitis 6-28yrs ago  . History of colon polyps  benign  . Hyperlipidemia    takes Atorvastatin on Mondays and Fridays  . Hypertension    takes Ramipril daily  . Joint pain   . Joint swelling   . Multiple sclerosis ([place])   . Neuromuscular disorder ([place])    Dr. Park Liter- [facility], follows M.S.  . Nocturia   . OA (osteoarthritis)    Dr Jani Gravel weekly, RA- hands- knees- feet   . PONV (postoperative nausea and vomiting)    trouble urinating after surgery in 2014  . Postoperative nausea and vomiting 01/11/2019  . Rheumatoid arthritis(714.0)    Dr Estanislado Pandy takes [name] daily  . Right wrist fracture   . Skin cancer   . Squamous cell carcinoma of skin  11/02/2001   in situ-right knee (cx16fu)  . Squamous cell carcinoma of skin 11/12/2011   in situ-left forearm (CX35FU), in situ-left foot (CX35FU)  . Squamous cell carcinoma of skin 01/22/2012   in situ-left lower forearm (txpbx)  . Squamous cell carcinoma of skin 11/17/2012   right shin (txpbx)  . Squamous cell carcinoma of skin 10/28/2013   in situ-right shoulder (CX35FU)  . Squamous cell carcinoma of skin 04/28/2014   well diff-right shin (txpbx)  . Squamous cell carcinoma of skin 11/02/2014   in situ-Left shin (txpbx)  . Squamous cell carcinoma of skin 12/15/2014   in situ-Left hand (txpbx), bowens-left side chest (txpbx)  . Squamous cell carcinoma of skin 05/09/2015   in situ-left hand (txpbx)   . Squamous cell carcinoma of skin 05/07/2016   in situ-left inner shin,ant, in situ-left inner shin, post, in situ-left outer forearm, in situ-right knuckle  . Squamous cell carcinoma of skin 03/1302018   in situ-left shoulder (txpbx), in situ-right inner shin (txpbx), in situ-top of left foot (txpbx), KA- left forearm (txpbx)  . Squamous cell carcinoma of skin 12/02/2016   in situ-left inner shin (txpbx)  . Squamous cell carcinoma of skin 03/04/2017   in situ-left outer sup, shin (txpbx), in situ- right 2nd knuckle finger (txpbx), in situ- Left wrist (txpbx)  . Squamous cell carcinoma of skin 04/06/2018   in situ-above left knee inner (txpbx)  . Squamous cell carcinoma of skin 04/21/2018   in situ-right top hand (txpbx)  . Squamous cell carcinoma of skin 07/08/2018   in situ-right lower inner shin (txpbx)  . Squamous cell carcinoma of skin 04/27/2019   in situ- Left neck(CX35FU), In situ- right neck (CX35FU)  . Urinary retention    sees Dr.Wrenn about 2 times a yr     Past Surgical History:  Procedure Laterality Date  . ABDOMINAL HYSTERECTOMY     1985  . APPENDECTOMY     with TAH  . BACK SURGERY     several  . BREAST BIOPSY Right    Results were negative  . BROW LIFT  Bilateral 10/02/2016   Procedure: BILATERAL LOWER LID BLEPHAROPLASTY;  Surgeon: Clista Bernhardt, MD;  Location: [place];  Service: Plastics;  Laterality: Bilateral;  . CARPAL TUNNEL RELEASE Right   . cataracts    . CERVICAL FUSION     Dr Joya Salm  . CERVICAL FUSION  12/31/2012   Dr Joya Salm  . CHEST TUBE INSERTION     for traumatic Pneumothorax  . COLONOSCOPY  07/16/2010   normal   . COLONOSCOPY W/ POLYPECTOMY  1997   negative since; Dr Deatra Ina  . ESOPHAGOGASTRODUODENOSCOPY  07/16/2010   normal  . eye lid raise    . EYE SURGERY Bilateral  cataracts removed - /w IOL  . JOINT REPLACEMENT    . LUMBAR FUSION     Dr Joya Salm  . NASAL SINUS SURGERY    . POSTERIOR CERVICAL FUSION/FORAMINOTOMY N/A 12/31/2012   Procedure: POSTERIOR LATERAL CERVICAL FUSION/FORAMINOTOMY LEVEL 1 CERVICAL THREE-FOUR WITH LATERAL MASS SCREWS;  Surgeon: Floyce Stakes, MD;  Location: [place] [facility];  Service: Neurosurgery;  Laterality: N/A;  . PTOSIS REPAIR Bilateral 10/02/2016   Procedure: INTERNAL PTOSIS REPAIR;  Surgeon: Clista Bernhardt, MD;  Location: [place];  Service: Plastics;  Laterality: Bilateral;  . SKIN BIOPSY    . THYROID SURGERY     R lobe removed- 1972, has grown back - CT last done- 2018  . TOTAL HIP ARTHROPLASTY  12/22/2011   Procedure: TOTAL HIP ARTHROPLASTY;  Surgeon: Kerin Salen, MD;  Location: [facility];  Service: Orthopedics;  Laterality: Left;  . TOTAL SHOULDER ARTHROPLASTY    . TUBAL LIGATION     Family History  Problem Relation Age of Onset  . Cancer Mother        pancreatic  . Diabetes Mother   . Heart disease Father        Rheumatic  . Cancer Father        ? stomach  . Cancer Sister        stomach  . Cancer Maternal Aunt        X 4; ? primary  . Heart disease Paternal Aunt   . Cancer Maternal Grandmother        cervical  . Colon cancer Other        Aunts  . Multiple sclerosis Daughter   . Hypertension Neg Hx   . Stroke Neg Hx    Social History   Tobacco Use  . Smoking  status: Former Smoker    Packs/day: 2.50    Years: 20.00    Pack years: 50.00    Types: Cigarettes    Quit date: 05/26/1981    Years since quitting: 39.1  . Smokeless tobacco: Never Used  . Tobacco comment: Smoked [id number], up to 2.5 ppd  Vaping Use  . Vaping Use: Never used  Substance Use Topics  . Alcohol use: Yes    Alcohol/week: 14.0 standard drinks    Types: 14 Cans of beer per week    Comment: 2% beer  . Drug use: Never   Current Outpatient Medications  Medication Sig Dispense Refill  . atorvastatin (LIPITOR) 10 MG tablet TAKE 1 TAB ON MONDAYS AND FRIDAY 27 tablet 3  . Biotin 5000 MCG TABS Take 5,000 mcg by mouth daily.    . Calcium Carbonate-Vitamin D (CALCIUM + D PO) Take 1 tablet by mouth daily.     . Carboxymethylcellul-Glycerin (LUBRICATING EYE DROPS OP) Apply 1 drop to eye daily as needed (dry eyes).    . carvedilol (COREG) 6.25 MG tablet TAKE 1 TABLET (6.25 MG TOTAL) BY MOUTH 2 (TWO) TIMES DAILY WITH A MEAL. 180 tablet 1  . Cholecalciferol (VITAMIN D3) 2000 units capsule Take 2,000 Units by mouth daily.    . Cyanocobalamin (VITAMIN B-12 PO) Take 1 tablet by mouth daily.    [name] denosumab (PROLIA) 60 MG/ML SOSY injection INJECT 60 MG INTO THE SKIN EVERY 6 (SIX) MONTHS. Please deliver to clinic: [address], [place], [place] [address] 1 mL 1  . diazepam (VALIUM) 5 MG tablet Take 1 tablet (5 mg total) by mouth at bedtime. 30 tablet 5  . diclofenac sodium (VOLTAREN) 1 % GEL  APPLY 3 GRAMS TO 3 LARGE JOINTS 3 TIMES A DAY AS NEEDED 100 g 0  . famotidine (PEPCID) 40 MG tablet TAKE 1 TABLET BY MOUTH EVERY DAY 90 tablet 1  . folic acid (FOLVITE) 1 MG tablet TAKE 2 TABLETS BY MOUTH EVERY DAY 180 tablet 3  . furosemide (LASIX) 20 MG tablet TAKE 1 TABLET BY MOUTH EVERY DAY 90 tablet 1  . methotrexate (RHEUMATREX) 2.5 MG tablet TAKE 6 TABLETS BY MOUTH ONCE WEEKLY. CAUTION:CHEMOTHERAPY. PROTECT FROM LIGHT. 72 tablet 0  . predniSONE (DELTASONE) 5 MG tablet TAKE 1 TABLET BY MOUTH  EVERY DAY WITH BREAKFAST 90 tablet 0  . Pyridoxine HCl (VITAMIN B-6 PO) Take 1 tablet by mouth daily.    . traMADol (ULTRAM) 50 MG tablet Take 1 tablet (50 mg total) by mouth 2 (two) times daily. 60 tablet 5  . Upadacitinib ER ([name]) 15 MG TB24 Take 15 mg by mouth daily. 60 tablet 1   No current facility-administered medications for this visit.   Allergies  Allergen Reactions  . Darvon [Propoxyphene Hcl] Shortness Of Breath    ? Dose Related ? Lowered respirations greatly  . Meperidine Hcl Shortness Of Breath  . Penicillins Hives and Other (See Comments)    Has patient had a PCN reaction causing immediate rash, facial/tongue/throat swelling, SOB or lightheadedness with hypotension: No SEVERE RASH INVOLVING MUCUS MEMBRANES or SKIN NECROSIS: #  #  #  YES  #  #  #  Has patient had a PCN reaction that required hospitalization No Has patient had a PCN reaction occurring within the last 10 years: No.   . Azathioprine Other (See Comments)    Weakness  . Atorvastatin Nausea And Vomiting  . Demerol [Meperidine]     Other reaction(s): Respiratory Distress  . Leflunomide     unknown  . Ezetimibe-Simvastatin Nausea And Vomiting     Review of Systems:  All systems reviewed and negative except where noted in HPI.   PHYSICAL EXAM :    Wt Readings from Last 3 Encounters:  07/03/20 118 lb (53.5 kg)  06/27/20 120 lb 3.2 oz (54.5 kg)  04/10/20 117 lb 9.6 oz (53.3 kg)    BP 130/60   Pulse 72   Ht 5\' 1"  (1.549 m)   Wt 118 lb (53.5 kg)   BMI 22.30 kg/m  Constitutional:  Pleasant female in no acute distress. Psychiatric: Normal mood and affect. Behavior is normal. EENT: Pupils normal.  Conjunctivae are normal. No scleral icterus. Neck supple.  Cardiovascular: Normal rate, regular rhythm. No edema Pulmonary/chest: Effort normal and breath sounds normal. No wheezing, rales or rhonchi. Abdominal: Soft, nondistended, nontender. Bowel sounds active throughout. There are no masses palpable.  No hepatomegaly. Neurological: Alert and oriented to person place and time. Skin: Skin is warm and dry. No rashes noted.  Tye Savoy, NP  07/03/2020, 2:53 PM

## 2020-07-03 NOTE — Telephone Encounter (Signed)
Prolia received in the office via courier from [facility]. Prolia placed in Fridge #2. Patient does not have a nurse visit scheduled. Thanks!

## 2020-07-03 NOTE — Telephone Encounter (Signed)
Patient called stating she had her 2nd booster today.  Patient states she was told she could increase her Prednisone while she is off her other medications for 5 days.  Patient is requesting a return call to let her know how much she should increase her Prednisone to.  Patient states she has a doctor's appointment this afternoon at 2:30 so please call before or after 3:30.

## 2020-07-03 NOTE — Telephone Encounter (Signed)
Patient has been scheduled for 07/05/2020 at 10:00 am for Prolia injection.

## 2020-07-03 NOTE — Patient Instructions (Signed)
If you are age 79 or older, your body mass index should be between 23-30. Your Body mass index is 22.3 kg/m. If this is out of the aforementioned range listed, please consider follow up with your Primary Care Provider.  If you are age 40 or younger, your body mass index should be between 19-25. Your Body mass index is 22.3 kg/m. If this is out of the aformentioned range listed, please consider follow up with your Primary Care Provider.   It has been recommended to you by your physician that you have a(n) Endoscopy and Colonoscopy  completed. Per your request, we did not schedule the procedure(s) today. Please contact our office at [phone] should you decide to have the procedure completed. You will be scheduled for a pre-visit and procedure at that time.  We will call you when April's schedule is out.  Thank you for choosing me and [facility].  Tye Savoy, NP

## 2020-07-05 ENCOUNTER — Other Ambulatory Visit: Payer: Self-pay

## 2020-07-05 ENCOUNTER — Ambulatory Visit (INDEPENDENT_AMBULATORY_CARE_PROVIDER_SITE_OTHER): Payer: Medicare Other

## 2020-07-05 ENCOUNTER — Encounter: Payer: Self-pay | Admitting: Physician Assistant

## 2020-07-05 VITALS — BP 101/53 | HR 70

## 2020-07-05 DIAGNOSIS — M810 Age-related osteoporosis without current pathological fracture: Secondary | ICD-10-CM | POA: Diagnosis not present

## 2020-07-05 MED ORDER — DENOSUMAB 60 MG/ML ~~LOC~~ SOSY
60.0000 mg | PREFILLED_SYRINGE | Freq: Once | SUBCUTANEOUS | Status: AC
Start: 2020-07-05 — End: 2020-07-05
  Administered 2020-07-05: 60 mg via SUBCUTANEOUS

## 2020-07-05 NOTE — Progress Notes (Signed)
   Follow-Up Visit   Subjective  Sherri [name] is a 80 y.o. female who presents for the following: Actinic Keratosis (39mo arms hands legs).   The following portions of the chart were reviewed this encounter and updated as appropriate:  Tobacco  Allergies  Meds  Problems  Med Hx  Surg Hx  Fam Hx      Objective  Well appearing patient in no apparent distress; mood and affect are within normal limits.  A full examination was performed including scalp, head, eyes, ears, nose, lips, neck, chest, axillae, abdomen, back, buttocks, bilateral upper extremities, bilateral lower extremities, hands, feet, fingers, toes, fingernails, and toenails. All findings within normal limits unless otherwise noted below.  Objective  Chest - Medial [facility]): Multiple white scar- clear  Objective  Right Breast: Multiple white scar- clear  Objective  Left Breast (3), Left Forearm - Posterior, Mid Upper [place]ilion Lip: Erythematous patches with gritty scale.  Objective  Right Lower Leg - Anterior: Pink pearly papule      Assessment & Plan  History of basal cell cancer Chest - Medial (Center)  Yearly skin check  History of squamous cell carcinoma of skin Right Breast  Yearly skin check  AK (actinic keratosis) (5) Left Forearm - Posterior; Left Breast (3); Mid Upper [place] Lip  Destruction of lesion - Left Breast, Left Forearm - Posterior, Mid Upper [place] Lip Complexity: simple   Destruction method: cryotherapy   Informed consent: discussed and consent obtained   Timeout:  patient name, date of birth, surgical site, and procedure verified Lesion destroyed using liquid nitrogen: Yes   Cryotherapy cycles:  3 Outcome: patient tolerated procedure well with no complications    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 Right Lower Leg - Anterior  Skin / nail biopsy Type of biopsy: tangential   Informed consent: discussed and consent obtained    Timeout: patient name, date of birth, surgical site, and procedure verified   Procedure prep:  Patient was prepped and draped in usual sterile fashion (Non sterile) Prep type:  Chlorhexidine Anesthesia: the lesion was anesthetized in a standard fashion   Anesthetic:  1% lidocaine w/ epinephrine 1-100,000 local infiltration Instrument used: flexible razor blade   Outcome: patient tolerated procedure well   Post-procedure details: wound care instructions given    Destruction of lesion Complexity: simple   Destruction method: electrodesiccation and curettage   Informed consent: discussed and consent obtained   Timeout:  patient name, date of birth, surgical site, and procedure verified Anesthesia: the lesion was anesthetized in a standard fashion   Anesthetic:  1% lidocaine w/ epinephrine 1-100,000 local infiltration Curettage performed in three different directions: Yes   Curettage cycles:  3 Margin per side (cm):  0.1 Final wound size (cm):  1.3 Hemostasis achieved with:  aluminum chloride Outcome: patient tolerated procedure well with no complications   Post-procedure details: wound care instructions given    Specimen 1 - Surgical pathology Differential Diagnosis: bcc vs scc   Check Margins: No    I, Anyelina Claycomb, PA-C, have reviewed all documentation's for this visit.  The documentation on 07/05/20 for the exam, diagnosis, procedures and orders are all accurate and complete.

## 2020-07-05 NOTE — Progress Notes (Signed)
Patient presents in the office today to receive bi-annual dose of Prolia. Last dose of prolia was on 12/08/2019. Patient injected in left upper arm, patient tolerated the injection well. No adverse reactions noted.   Labs: 06/27/2020 MCV and MCH remain elevated. Hemoglobin and hematocrit are WNL. Rest of CBC stable. Globulin is low. Alk phos is low. Phosphorus WNL. Vitamin D WNL.  Calcium 10.0 on 06/27/2020. Dr. Estanislado Pandy reviewed patient's calcium levels this morning and advised that patient does not need to return for labs 10 days after receiving Prolia.   Administrations This Visit    denosumab (PROLIA) injection 60 mg    Admin Date 07/05/2020 Action Given Dose 60 mg Route Subcutaneous Administered By Earnestine Mealing, CMA

## 2020-07-12 ENCOUNTER — Telehealth: Payer: Self-pay

## 2020-07-12 ENCOUNTER — Telehealth: Payer: Self-pay | Admitting: Pharmacy Technician

## 2020-07-12 DIAGNOSIS — M0579 Rheumatoid arthritis with rheumatoid factor of multiple sites without organ or systems involvement: Secondary | ICD-10-CM

## 2020-07-12 MED FILL — RINVOQ 15 MG TB24: 15 | 30 days supply | Qty: 30 | Fill #1

## 2020-07-12 NOTE — Telephone Encounter (Signed)
Received message form [facility] that patient has fallen into the coverage gap and her [name] copay has increased to $1746.17 for 30 days supply.   Patient's income was for PAP in the past, but she can try again.   Spoke to patient, she would like to place refill on hold while she contacts her plan to see how much she will have to pay to get out of the coverage gap. Office has also submitted a tier exception to her insurance plan.  Patient may reach out to office for a sample if needed, for the interim.  Submitted a [place] Prior Authorization request to [facility] for [facility] via Telephone. Will update once we receive a response.   PA# [id number]  Phone# [phone]

## 2020-07-12 NOTE — Telephone Encounter (Signed)
Scheduled EGD/Colon in [place] on 09/17/20. Pre-Visit on 09/10/20, patient to arrive at 1:15pm.Had COVID vaccines and booster.

## 2020-07-13 NOTE — Telephone Encounter (Signed)
Received a fax regarding Tier Exception Prior Authorization from [facility] for [place]. Authorization has been DENIED because specialty medications are excluded from tier exceptions under patient's plan.  Phone# [phone]

## 2020-07-18 ENCOUNTER — Other Ambulatory Visit: Payer: Self-pay | Admitting: Internal Medicine

## 2020-07-19 ENCOUNTER — Encounter: Payer: Self-pay | Admitting: Internal Medicine

## 2020-07-20 ENCOUNTER — Other Ambulatory Visit: Payer: Self-pay

## 2020-07-22 ENCOUNTER — Encounter: Payer: Self-pay | Admitting: Internal Medicine

## 2020-07-22 ENCOUNTER — Encounter: Payer: Self-pay | Admitting: Family Medicine

## 2020-07-22 MED ORDER — CARVEDILOL 6.25 MG PO TABS: 6.2500 mg | ORAL_TABLET | Freq: Two times a day (BID) | ORAL | 1 refills | Status: DC

## 2020-07-22 NOTE — Patient Instructions (Addendum)
Medications changes include :     Your prescription(s) have been submitted to your pharmacy. Please take as directed and contact our office if you believe you are having problem(s) with the medication(s).   A referral was ordered for        Someone from their office will call you to schedule an appointment.    Please followup in 6 months    Health Maintenance, Female Adopting a healthy lifestyle and getting preventive care are important in promoting health and wellness. Ask your health care provider about:  The right schedule for you to have regular tests and exams.  Things you can do on your own to prevent diseases and keep yourself healthy. What should I know about diet, weight, and exercise? Eat a healthy diet  Eat a diet that includes plenty of vegetables, fruits, low-fat dairy products, and lean protein.  Do not eat a lot of foods that are high in solid fats, added sugars, or sodium.   Maintain a healthy weight Body mass index (BMI) is used to identify weight problems. It estimates body fat based on height and weight. Your health care provider can help determine your BMI and help you achieve or maintain a healthy weight. Get regular exercise Get regular exercise. This is one of the most important things you can do for your health. Most adults should:  Exercise for at least 150 minutes each week. The exercise should increase your heart rate and make you sweat (moderate-intensity exercise).  Do strengthening exercises at least twice a week. This is in addition to the moderate-intensity exercise.  Spend less time sitting. Even light physical activity can be beneficial. Watch cholesterol and blood lipids Have your blood tested for lipids and cholesterol at 79 years of age, then have this test every 5 years. Have your cholesterol levels checked more often if:  Your lipid or cholesterol levels are high.  You are older than 79 years of age.  You are at high risk for  heart disease. What should I know about cancer screening? Depending on your health history and family history, you may need to have cancer screening at various ages. This may include screening for:  Breast cancer.  Cervical cancer.  Colorectal cancer.  Skin cancer.  Lung cancer. What should I know about heart disease, diabetes, and high blood pressure? Blood pressure and heart disease  High blood pressure causes heart disease and increases the risk of stroke. This is more likely to develop in people who have high blood pressure readings, are of African descent, or are overweight.  Have your blood pressure checked: ? Every 3-5 years if you are 1-53 years of age. ? Every year if you are 42 years old or older. Diabetes Have regular diabetes screenings. This checks your fasting blood sugar level. Have the screening done:  Once every three years after age 51 if you are at a normal weight and have a low risk for diabetes.  More often and at a younger age if you are overweight or have a high risk for diabetes. What should I know about preventing infection? Hepatitis B If you have a higher risk for hepatitis B, you should be screened for this virus. Talk with your health care provider to find out if you are at risk for hepatitis B infection. Hepatitis C Testing is recommended for:  Everyone born from [birth year] through 1964.  Anyone with known risk factors for hepatitis C. Sexually transmitted infections (STIs)  Get screened  for STIs, including gonorrhea and chlamydia, if: ? You are sexually active and are younger than 79 years of age. ? You are older than 79 years of age and your health care provider tells you that you are at risk for this type of infection. ? Your sexual activity has changed since you were last screened, and you are at increased risk for chlamydia or gonorrhea. Ask your health care provider if you are at risk.  Ask your health care provider about whether you are at  high risk for HIV. Your health care provider may recommend a prescription medicine to help prevent HIV infection. If you choose to take medicine to prevent HIV, you should first get tested for HIV. You should then be tested every 3 months for as long as you are taking the medicine. Pregnancy  If you are about to stop having your period (premenopausal) and you may become pregnant, seek counseling before you get pregnant.  Take 400 to 800 micrograms (mcg) of folic acid every day if you become pregnant.  Ask for birth control (contraception) if you want to prevent pregnancy. Osteoporosis and menopause Osteoporosis is a disease in which the bones lose minerals and strength with aging. This can result in bone fractures. If you are 80 years old or older, or if you are at risk for osteoporosis and fractures, ask your health care provider if you should:  Be screened for bone loss.  Take a calcium or vitamin D supplement to lower your risk of fractures.  Be given hormone replacement therapy (HRT) to treat symptoms of menopause. Follow these instructions at home: Lifestyle  Do not use any products that contain nicotine or tobacco, such as cigarettes, e-cigarettes, and chewing tobacco. If you need help quitting, ask your health care provider.  Do not use street drugs.  Do not share needles.  Ask your health care provider for help if you need support or information about quitting drugs. Alcohol use  Do not drink alcohol if: ? Your health care provider tells you not to drink. ? You are pregnant, may be pregnant, or are planning to become pregnant.  If you drink alcohol: ? Limit how much you use to 0-1 drink a day. ? Limit intake if you are breastfeeding.  Be aware of how much alcohol is in your drink. In the [place] one drink equals one 12 oz bottle of beer (355 mL), one 5 oz glass of wine (148 mL), or one 1 oz glass of hard liquor (44 mL). General instructions  Schedule regular health,  dental, and eye exams.  Stay current with your vaccines.  Tell your health care provider if: ? You often feel depressed. ? You have ever been abused or do not feel safe at home. Summary  Adopting a healthy lifestyle and getting preventive care are important in promoting health and wellness.  Follow your health care provider's instructions about healthy diet, exercising, and getting tested or screened for diseases.  Follow your health care provider's instructions on monitoring your cholesterol and blood pressure. This information is not intended to replace advice given to you by your health care provider. Make sure you discuss any questions you have with your health care provider. Document Revised: 05/05/2018 Document Reviewed: 05/05/2018 Elsevier Patient Education  2021 [employer].

## 2020-07-22 NOTE — Progress Notes (Signed)
Subjective:    Patient ID: Sherri [name], female    DOB: June 11, 1940, 80 y.o.   MRN: [id number]   This visit occurred during the SARS-CoV-2 public health emergency.  Safety protocols were in place, including screening questions prior to the visit, additional usage of staff PPE, and extensive cleaning of exam room while observing appropriate contact time as indicated for disinfecting solutions.    HPI She is here for a physical exam.     Medications and allergies reviewed with patient and updated if appropriate.  Patient Active Problem List   Diagnosis Date Noted  . Dark urine 04/10/2020  . Mouth sore 04/10/2020  . Abnormal urine odor 04/10/2020  . Dehydration 04/10/2020  . Restless leg 02/02/2020  . Bilateral leg edema 01/11/2020  . Cellulitis of right upper extremity 10/26/2019  . Cervical radiculopathy 06/28/2019  . Carpal tunnel syndrome on both sides 06/28/2019  . Bilateral cataracts 01/11/2019  . Dizziness 01/11/2019  . Postoperative nausea and vomiting 01/11/2019  . Right sided sciatica 04/14/2018  . Trochanteric bursitis of right hip 04/14/2018  . S/P cervical spinal fusion 11/10/2017  . Dysphagia 10/26/2017  . Gastroesophageal reflux disease 08/06/2017  . Easy bruising 08/06/2017  . Anxiety 04/24/2017  . Mid back pain 03/26/2017  . Dyspnea on exertion 03/14/2017  . Hyperkalemia 03/13/2017  . Submandibular gland swelling 01/08/2017  . High risk medication use 09/02/2016  . Piriformis muscle pain 09/02/2016  . Weakness 07/22/2016  . Right hip pain 04/08/2016  . Right knee pain 02/25/2016  . Dyspepsia 04/05/2015  . Numbness 11/14/2014  . Biceps tendon tear 10/10/2014  . Insomnia 10/10/2014  . Gait disturbance 06/13/2014  . 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 OP (osteoporosis) 01/18/2014  . Other long term (current) drug therapy 01/18/2014  . Other dysphagia 12/29/2013  . Hoarseness 12/29/2013  . 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 Personal history of colonic polyps 07/03/2010  . Nocturia 05/28/2010  . THYROID NODULE 05/17/2008  . Multiple sclerosis ([place]) 05/17/2008  . Other fatigue 03/30/2007  . Hyperlipidemia 08/20/2006  . Essential hypertension 08/20/2006  . Rheumatoid arthritis ([place]) 08/20/2006  . CERVICAL CANCER, HX OF 08/20/2006  . GASTRIC ULCER, HX OF 08/20/2006    Current Outpatient Medications on File Prior to Visit  Medication Sig Dispense Refill  . atorvastatin (LIPITOR) 10 MG tablet TAKE 1 TAB ON MONDAYS AND FRIDAY 27 tablet 3  . Biotin 5000 MCG TABS Take 5,000 mcg by mouth daily.    . Calcium Carbonate-Vitamin D (CALCIUM + D PO) Take 1 tablet by mouth daily.     . Carboxymethylcellul-Glycerin (LUBRICATING EYE DROPS OP) Apply 1 drop to eye daily as needed (dry eyes).    . carvedilol (COREG) 6.25 MG tablet TAKE 1 TABLET (6.25 MG TOTAL) BY MOUTH 2 (TWO) TIMES DAILY WITH A MEAL. 180 tablet 1  . Cholecalciferol (VITAMIN D3) 2000 units capsule Take 2,000 Units by mouth daily.    . Cyanocobalamin (VITAMIN B-12 PO) Take 1 tablet by mouth daily.    [name] denosumab (PROLIA) 60 MG/ML SOSY injection INJECT 60 MG INTO THE SKIN EVERY 6 (SIX) MONTHS. Please deliver to clinic: [address], [place], [place] [address] 1 mL 1  . diazepam (VALIUM) 5 MG tablet Take 1 tablet (5 mg total) by mouth at bedtime. 30 tablet 5  . diclofenac sodium (VOLTAREN) 1 % GEL APPLY 3 GRAMS TO 3 LARGE JOINTS 3 TIMES A DAY AS NEEDED 100 g 0  .  famotidine (PEPCID) 40 MG tablet TAKE 1 TABLET BY MOUTH EVERY DAY 90 tablet 1  . folic acid (FOLVITE) 1 MG tablet TAKE 2 TABLETS BY MOUTH EVERY DAY 180 tablet 3  . furosemide (LASIX) 20 MG tablet TAKE 1 TABLET BY MOUTH EVERY DAY 90 tablet 1  . methotrexate (RHEUMATREX) 2.5 MG tablet TAKE 6 TABLETS BY MOUTH ONCE WEEKLY. CAUTION:CHEMOTHERAPY. PROTECT FROM LIGHT. 72 tablet 0  . predniSONE (DELTASONE) 5 MG tablet TAKE 1 TABLET BY MOUTH EVERY DAY WITH BREAKFAST 90 tablet 0  . Pyridoxine HCl  (VITAMIN B-6 PO) Take 1 tablet by mouth daily.    . traMADol (ULTRAM) 50 MG tablet Take 1 tablet (50 mg total) by mouth 2 (two) times daily. 60 tablet 5  . Upadacitinib ER ([name]) 15 MG TB24 Take 15 mg by mouth daily. 60 tablet 1   No current facility-administered medications on file prior to visit.    Past Medical History:  Diagnosis Date  . Anxiety    takes Valium daily as needed  . Basal cell carcinoma 01/21/1988   left nostril (MOHS), sup-right calf (CX35FU)  . Basal cell carcinoma 03/22/1996   right calf (CX35FU)  . Basal cell carcinoma 03/31/1995   left wing nose  . Bruises easily    d/t meds  . Cancer ([place])    basal cell ca, in situ- uterine   . Cataracts, bilateral   . Chronic back pain   . Dizziness    r/t to meds  . GERD (gastroesophageal reflux disease)    no meds on a regular basis but will take Tums if needed  . Headache(784.0)    r/t neck issues  . History of bronchitis 6-102yrs ago  . History of colon polyps    benign  . Hyperlipidemia    takes Atorvastatin on Mondays and Fridays  . Hypertension    takes Ramipril daily  . Joint pain   . Joint swelling   . Multiple sclerosis ([place])   . Neuromuscular disorder ([place])    Dr. Park Liter- [facility], follows [facility]  . Nocturia   . OA (osteoarthritis)    Dr Jani Gravel weekly, RA- hands- knees- feet   . PONV (postoperative nausea and vomiting)    trouble urinating after surgery in 2014  . Postoperative nausea and vomiting 01/11/2019  . Rheumatoid arthritis(714.0)    Dr Estanislado Pandy takes [name] daily  . Right wrist fracture   . Skin cancer   . Squamous cell carcinoma of skin 11/02/2001   in situ-right knee (cx47fu)  . Squamous cell carcinoma of skin 11/12/2011   in situ-left forearm (CX35FU), in situ-left foot (CX35FU)  . Squamous cell carcinoma of skin 01/22/2012   in situ-left lower forearm (txpbx)  . Squamous cell carcinoma of skin 11/17/2012   right shin (txpbx)  . Squamous cell carcinoma of  skin 10/28/2013   in situ-right shoulder (CX35FU)  . Squamous cell carcinoma of skin 04/28/2014   well diff-right shin (txpbx)  . Squamous cell carcinoma of skin 11/02/2014   in situ-Left shin (txpbx)  . Squamous cell carcinoma of skin 12/15/2014   in situ-Left hand (txpbx), bowens-left side chest (txpbx)  . Squamous cell carcinoma of skin 05/09/2015   in situ-left hand (txpbx)   . Squamous cell carcinoma of skin 05/07/2016   in situ-left inner shin,ant, in situ-left inner shin, post, in situ-left outer forearm, in situ-right knuckle  . Squamous cell carcinoma of skin 03/1302018   in situ-left shoulder (txpbx), in situ-right inner shin (txpbx),  in situ-top of left foot (txpbx), KA- left forearm (txpbx)  . Squamous cell carcinoma of skin 12/02/2016   in situ-left inner shin (txpbx)  . Squamous cell carcinoma of skin 03/04/2017   in situ-left outer sup, shin (txpbx), in situ- right 2nd knuckle finger (txpbx), in situ- Left wrist (txpbx)  . Squamous cell carcinoma of skin 04/06/2018   in situ-above left knee inner (txpbx)  . Squamous cell carcinoma of skin 04/21/2018   in situ-right top hand (txpbx)  . Squamous cell carcinoma of skin 07/08/2018   in situ-right lower inner shin (txpbx)  . Squamous cell carcinoma of skin 04/27/2019   in situ- Left neck(CX35FU), In situ- right neck (CX35FU)  . Urinary retention    sees Dr.Wrenn about 2 times a yr    Past Surgical History:  Procedure Laterality Date  . ABDOMINAL HYSTERECTOMY     1985  . APPENDECTOMY     with TAH  . BACK SURGERY     several  . BREAST BIOPSY Right    Results were negative  . BROW LIFT Bilateral 10/02/2016   Procedure: BILATERAL LOWER LID BLEPHAROPLASTY;  Surgeon: Clista Bernhardt, MD;  Location: [facility];  Service: Plastics;  Laterality: Bilateral;  . CARPAL TUNNEL RELEASE Right   . cataracts    . CERVICAL FUSION     Dr Joya Salm  . CERVICAL FUSION  12/31/2012   Dr Joya Salm  . CHEST TUBE INSERTION     for traumatic  Pneumothorax  . COLONOSCOPY  07/16/2010   normal   . COLONOSCOPY W/ POLYPECTOMY  1997   negative since; Dr Deatra Ina  . ESOPHAGOGASTRODUODENOSCOPY  07/16/2010   normal  . eye lid raise    . EYE SURGERY Bilateral    cataracts removed - /w IOL  . JOINT REPLACEMENT    . LUMBAR FUSION     Dr Joya Salm  . NASAL SINUS SURGERY    . POSTERIOR CERVICAL FUSION/FORAMINOTOMY N/A 12/31/2012   Procedure: POSTERIOR LATERAL CERVICAL FUSION/FORAMINOTOMY LEVEL 1 CERVICAL THREE-FOUR WITH LATERAL MASS SCREWS;  Surgeon: Floyce Stakes, MD;  Location: [facility];  Service: Neurosurgery;  Laterality: N/A;  . PTOSIS REPAIR Bilateral 10/02/2016   Procedure: INTERNAL PTOSIS REPAIR;  Surgeon: Clista Bernhardt, MD;  Location: [facility];  Service: Plastics;  Laterality: Bilateral;  . SKIN BIOPSY    . THYROID SURGERY     R lobe removed- 1972, has grown back - CT last done- 2018  . TOTAL HIP ARTHROPLASTY  12/22/2011   Procedure: TOTAL HIP ARTHROPLASTY;  Surgeon: Kerin Salen, MD;  Location: [facility];  Service: Orthopedics;  Laterality: Left;  . TOTAL SHOULDER ARTHROPLASTY    . TUBAL LIGATION      Social History   Socioeconomic History  . Marital status: Married    Spouse name: Not on file  . Number of children: Not on file  . Years of education: Not on file  . Highest education level: Not on file  Occupational History  . Occupation: retired  Tobacco Use  . Smoking status: Former Smoker    Packs/day: 2.50    Years: 20.00    Pack years: 50.00    Types: Cigarettes    Quit date: 05/26/1981    Years since quitting: 39.1  . Smokeless tobacco: Never Used  . Tobacco comment: Smoked [id number], up to 2.5 ppd  Vaping Use  . Vaping Use: Never used  Substance and Sexual Activity  . Alcohol use: Yes    Alcohol/week: 14.0 standard  drinks    Types: 14 Cans of beer per week    Comment: 2% beer  . Drug use: Never  . Sexual activity: Yes  Other Topics Concern  . Not on file  Social History Narrative  . Not on file    Social Determinants of Health   Financial Resource Strain: Low Risk   . Difficulty of Paying Living Expenses: Not very hard  Food Insecurity: Not on file  Transportation Needs: Not on file  Physical Activity: Not on file  Stress: Not on file  Social Connections: Not on file    Family History  Problem Relation Age of Onset  . Cancer Mother        pancreatic  . Diabetes Mother   . Heart disease Father        Rheumatic  . Cancer Father        ? stomach  . Cancer Sister        stomach  . Cancer Maternal Aunt        X 4; ? primary  . Heart disease Paternal Aunt   . Cancer Maternal Grandmother        cervical  . Colon cancer Other        Aunts  . Multiple sclerosis Daughter   . Hypertension Neg Hx   . Stroke Neg Hx     Review of Systems     Objective:  There were no vitals filed for this visit. There were no vitals filed for this visit. There is no height or weight on file to calculate BMI.  BP Readings from Last 3 Encounters:  07/05/20 (!) 101/53  07/03/20 130/60  06/27/20 (!) 159/89    Wt Readings from Last 3 Encounters:  07/03/20 118 lb (53.5 kg)  06/27/20 120 lb 3.2 oz (54.5 kg)  04/10/20 117 lb 9.6 oz (53.3 kg)     Physical Exam Constitutional: She appears well-developed and well-nourished. No distress.  HENT:  Head: Normocephalic and atraumatic.  Right Ear: External ear normal. Normal ear canal and TM Left Ear: External ear normal.  Normal ear canal and TM Mouth/Throat: Oropharynx is clear and moist.  Eyes: Conjunctivae and EOM are normal.  Neck: Neck supple. No tracheal deviation present. No thyromegaly present.  No carotid bruit  Cardiovascular: Normal rate, regular rhythm and normal heart sounds.   No murmur heard.  No edema. Pulmonary/Chest: Effort normal and breath sounds normal. No respiratory distress. She has no wheezes. She has no rales.  Breast: deferred   Abdominal: Soft. She exhibits no distension. There is no tenderness.   Lymphadenopathy: She has no cervical adenopathy.  Skin: Skin is warm and dry. She is not diaphoretic.  Psychiatric: She has a normal mood and affect. Her behavior is normal.        Assessment & Plan:   Physical exam: Screening blood work    reviewed Immunizations  Discussed tdap, others up to date Colonoscopy  n/a Mammogram   Gyn  Dr Matthew Saras - up to date Dexa  Monitored by Dr Matthew Saras - Up to date  Eye exams   Exercise   Weight  Normal Substance abuse   none      See Problem List for Assessment and Plan of chronic medical problems.      This encounter was created in error - please disregard.

## 2020-07-23 ENCOUNTER — Other Ambulatory Visit: Payer: Self-pay

## 2020-07-23 ENCOUNTER — Encounter: Payer: Medicare Other | Admitting: Internal Medicine

## 2020-07-23 ENCOUNTER — Ambulatory Visit (INDEPENDENT_AMBULATORY_CARE_PROVIDER_SITE_OTHER): Payer: Medicare Other | Admitting: Family Medicine

## 2020-07-23 DIAGNOSIS — F418 Other specified anxiety disorders: Secondary | ICD-10-CM

## 2020-07-23 DIAGNOSIS — F5101 Primary insomnia: Secondary | ICD-10-CM

## 2020-07-23 DIAGNOSIS — M0579 Rheumatoid arthritis with rheumatoid factor of multiple sites without organ or systems involvement: Secondary | ICD-10-CM

## 2020-07-23 DIAGNOSIS — Z0000 Encounter for general adult medical examination without abnormal findings: Secondary | ICD-10-CM

## 2020-07-23 DIAGNOSIS — M79672 Pain in left foot: Secondary | ICD-10-CM

## 2020-07-23 DIAGNOSIS — E7849 Other hyperlipidemia: Secondary | ICD-10-CM

## 2020-07-23 DIAGNOSIS — I10 Essential (primary) hypertension: Secondary | ICD-10-CM

## 2020-07-23 DIAGNOSIS — M810 Age-related osteoporosis without current pathological fracture: Secondary | ICD-10-CM

## 2020-07-23 DIAGNOSIS — K219 Gastro-esophageal reflux disease without esophagitis: Secondary | ICD-10-CM

## 2020-07-23 MED ORDER — PREDNISONE 10 MG PO TABS: ORAL_TABLET | ORAL | 0 refills | Status: DC

## 2020-07-23 NOTE — Progress Notes (Signed)
Office Visit Note   Patient: Sherri [name]           Date of Birth: November 25, 1940           MRN: [id number] Visit Date: 07/23/2020 Requested by: Eunice Blase, MD [place],  [place] [id number] PCP: Eunice Blase, MD  Subjective: Chief Complaint  Patient presents with  . Left Foot - Pain    8 days ago, while walking out of the drugstore, she had a sharp pain in her foot - around the 4th/5th metatarsals. 2 days after that, she started having throbbing pain in the foot - moved to the lateral aspect and up lateral ankle. Difficulty sleeping due to the pain. Tramadol did help. Walking with a cane today.    HPI: She is here with left foot pain.  Symptoms started about a week ago, no injury.  Fairly sudden onset of pain on the lateral aspect of her foot with intermittent swelling.  It has gotten more severe to the point that it keeps her awake at night.  She is on 5 mg prednisone daily chronically for rheumatoid arthritis.  Her chart shows a history of gout but she does not recall having it.  No change in her activities to cause a stress fracture.               ROS:   All other systems were reviewed and are negative.  Objective: Vital Signs: There were no vitals taken for this visit.  Physical Exam:  General:  Alert and oriented, in no acute distress. Pulm:  Breathing unlabored. Psy:  Normal mood, congruent affect. Skin: There is slight erythema on the lateral heel and near the proximal fifth metatarsal.  Slight increased warmth. She is tender to palpation near the proximal fifth metatarsal of the left foot and has quite a bit of pain with eversion against resistance.  No ankle joint effusion.   Imaging: No results found.  Assessment & Plan: 1.  Left lateral foot pain, etiology uncertain.  Suspicious for gout. -We will try a short prednisone taper.  If this does not help, then  three-view foot x-rays.     Procedures: No procedures performed        PMFS  History: Patient Active Problem List   Diagnosis Date Noted  . Dark urine 04/10/2020  . Mouth sore 04/10/2020  . Abnormal urine odor 04/10/2020  . Dehydration 04/10/2020  . Restless leg 02/02/2020  . Bilateral leg edema 01/11/2020  . Cellulitis of right upper extremity 10/26/2019  . Cervical radiculopathy 06/28/2019  . Carpal tunnel syndrome on both sides 06/28/2019  . Bilateral cataracts 01/11/2019  . Dizziness 01/11/2019  . Right sided sciatica 04/14/2018  . Trochanteric bursitis of right hip 04/14/2018  . S/P cervical spinal fusion 11/10/2017  . Dysphagia 10/26/2017  . Gastroesophageal reflux disease 08/06/2017  . Easy bruising 08/06/2017  . Anxiety 04/24/2017  . Mid back pain 03/26/2017  . Dyspnea on exertion 03/14/2017  . Hyperkalemia 03/13/2017  . Submandibular gland swelling 01/08/2017  . High risk medication use 09/02/2016  . Piriformis muscle pain 09/02/2016  . Weakness 07/22/2016  . Right hip pain 04/08/2016  . Right knee pain 02/25/2016  . Dyspepsia 04/05/2015  . Numbness 11/14/2014  . Biceps tendon tear 10/10/2014  . Insomnia 10/10/2014  . Gait disturbance 06/13/2014  . 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 OP (osteoporosis) 01/18/2014  . Other long term (current) drug therapy 01/18/2014  . Hoarseness 12/29/2013  . Spondylolisthesis of  C3-4 s/p fusion C4-7 01/01/2013  . Personal history of colonic polyps 07/03/2010  . Nocturia 05/28/2010  . THYROID NODULE 05/17/2008  . Multiple sclerosis ([place]) 05/17/2008  . Other fatigue 03/30/2007  . Hyperlipidemia 08/20/2006  . Essential hypertension 08/20/2006  . Rheumatoid arthritis ([place]) 08/20/2006  . CERVICAL CANCER, HX OF 08/20/2006  . GASTRIC ULCER, HX OF 08/20/2006   Past Medical History:  Diagnosis Date  . Anxiety    takes Valium daily as needed  . Basal cell carcinoma 01/21/1988   left nostril (MOHS), sup-right calf (CX35FU)  . Basal cell carcinoma 03/22/1996   right calf (CX35FU)  . Basal cell  carcinoma 03/31/1995   left wing nose  . Bruises easily    d/t meds  . Cancer ([place])    basal cell ca, in situ- uterine   . Cataracts, bilateral   . Chronic back pain   . Dizziness    r/t to meds  . GERD (gastroesophageal reflux disease)    no meds on a regular basis but will take Tums if needed  . Headache(784.0)    r/t neck issues  . History of bronchitis 6-41yrs ago  . History of colon polyps    benign  . Hyperlipidemia    takes Atorvastatin on Mondays and Fridays  . Hypertension    takes Ramipril daily  . Joint pain   . Joint swelling   . Multiple sclerosis ([place])   . Neuromuscular disorder ([place])    Dr. Park Liter- [facility], follows M.S.  . Nocturia   . OA (osteoarthritis)    Dr Jani Gravel weekly, RA- hands- knees- feet   . PONV (postoperative nausea and vomiting)    trouble urinating after surgery in 2014  . Postoperative nausea and vomiting 01/11/2019  . Rheumatoid arthritis(714.0)    Dr Estanislado Pandy takes [name] daily  . Right wrist fracture   . Skin cancer   . Squamous cell carcinoma of skin 11/02/2001   in situ-right knee (cx56fu)  . Squamous cell carcinoma of skin 11/12/2011   in situ-left forearm (CX35FU), in situ-left foot (CX35FU)  . Squamous cell carcinoma of skin 01/22/2012   in situ-left lower forearm (txpbx)  . Squamous cell carcinoma of skin 11/17/2012   right shin (txpbx)  . Squamous cell carcinoma of skin 10/28/2013   in situ-right shoulder (CX35FU)  . Squamous cell carcinoma of skin 04/28/2014   well diff-right shin (txpbx)  . Squamous cell carcinoma of skin 11/02/2014   in situ-Left shin (txpbx)  . Squamous cell carcinoma of skin 12/15/2014   in situ-Left hand (txpbx), bowens-left side chest (txpbx)  . Squamous cell carcinoma of skin 05/09/2015   in situ-left hand (txpbx)   . Squamous cell carcinoma of skin 05/07/2016   in situ-left inner shin,ant, in situ-left inner shin, post, in situ-left outer forearm, in situ-right knuckle   . Squamous cell carcinoma of skin 03/1302018   in situ-left shoulder (txpbx), in situ-right inner shin (txpbx), in situ-top of left foot (txpbx), KA- left forearm (txpbx)  . Squamous cell carcinoma of skin 12/02/2016   in situ-left inner shin (txpbx)  . Squamous cell carcinoma of skin 03/04/2017   in situ-left outer sup, shin (txpbx), in situ- right 2nd knuckle finger (txpbx), in situ- Left wrist (txpbx)  . Squamous cell carcinoma of skin 04/06/2018   in situ-above left knee inner (txpbx)  . Squamous cell carcinoma of skin 04/21/2018   in situ-right top hand (txpbx)  . Squamous cell carcinoma of skin 07/08/2018  in situ-right lower inner shin (txpbx)  . Squamous cell carcinoma of skin 04/27/2019   in situ- Left neck(CX35FU), In situ- right neck (CX35FU)  . Urinary retention    sees Dr.Wrenn about 2 times a yr    Family History  Problem Relation Age of Onset  . Cancer Mother        pancreatic  . Diabetes Mother   . Heart disease Father        Rheumatic  . Cancer Father        ? stomach  . Cancer Sister        stomach  . Cancer Maternal Aunt        X 4; ? primary  . Heart disease Paternal Aunt   . Cancer Maternal Grandmother        cervical  . Colon cancer Other        Aunts  . Multiple sclerosis Daughter   . Hypertension Neg Hx   . Stroke Neg Hx     Past Surgical History:  Procedure Laterality Date  . ABDOMINAL HYSTERECTOMY     1985  . APPENDECTOMY     with TAH  . BACK SURGERY     several  . BREAST BIOPSY Right    Results were negative  . BROW LIFT Bilateral 10/02/2016   Procedure: BILATERAL LOWER LID BLEPHAROPLASTY;  Surgeon: Clista Bernhardt, MD;  Location: [place];  Service: Plastics;  Laterality: Bilateral;  . CARPAL TUNNEL RELEASE Right   . cataracts    . CERVICAL FUSION     Dr Joya Salm  . CERVICAL FUSION  12/31/2012   Dr Joya Salm  . CHEST TUBE INSERTION     for traumatic Pneumothorax  . COLONOSCOPY  07/16/2010   normal   . COLONOSCOPY W/ POLYPECTOMY   1997   negative since; Dr Deatra Ina  . ESOPHAGOGASTRODUODENOSCOPY  07/16/2010   normal  . eye lid raise    . EYE SURGERY Bilateral    cataracts removed - /w IOL  . JOINT REPLACEMENT    . LUMBAR FUSION     Dr Joya Salm  . NASAL SINUS SURGERY    . POSTERIOR CERVICAL FUSION/FORAMINOTOMY N/A 12/31/2012   Procedure: POSTERIOR LATERAL CERVICAL FUSION/FORAMINOTOMY LEVEL 1 CERVICAL THREE-FOUR WITH LATERAL MASS SCREWS;  Surgeon: Floyce Stakes, MD;  Location: [facility] [facility];  Service: Neurosurgery;  Laterality: N/A;  . PTOSIS REPAIR Bilateral 10/02/2016   Procedure: INTERNAL PTOSIS REPAIR;  Surgeon: Clista Bernhardt, MD;  Location: [place] [facility];  Service: Plastics;  Laterality: Bilateral;  . SKIN BIOPSY    . THYROID SURGERY     R lobe removed- 1972, has grown back - CT last done- 2018  . TOTAL HIP ARTHROPLASTY  12/22/2011   Procedure: TOTAL HIP ARTHROPLASTY;  Surgeon: Kerin Salen, MD;  Location: [place];  Service: Orthopedics;  Laterality: Left;  . TOTAL SHOULDER ARTHROPLASTY    . TUBAL LIGATION     Social History   Occupational History  . Occupation: retired  Tobacco Use  . Smoking status: Former Smoker    Packs/day: 2.50    Years: 20.00    Pack years: 50.00    Types: Cigarettes    Quit date: 05/26/1981    Years since quitting: 39.1  . Smokeless tobacco: Never Used  . Tobacco comment: Smoked [phone], up to 2.5 ppd  Vaping Use  . Vaping Use: Never used  Substance and Sexual Activity  . Alcohol use: Yes    Alcohol/week: 14.0 standard drinks  Types: 14 Cans of beer per week    Comment: 2% beer  . Drug use: Never  . Sexual activity: Yes

## 2020-07-24 ENCOUNTER — Ambulatory Visit: Payer: Medicare Other | Admitting: Family Medicine

## 2020-07-27 ENCOUNTER — Encounter: Payer: Self-pay | Admitting: Family Medicine

## 2020-07-27 MED ORDER — COLCHICINE 0.6 MG PO CAPS: 1.0000 | ORAL_CAPSULE | Freq: Two times a day (BID) | ORAL | 3 refills | Status: DC | PRN

## 2020-07-27 NOTE — Telephone Encounter (Signed)
Called patient to discuss insurance denial for tier exception.  Emailed [name] patient assistance application - she states that income has not changed since last time she applied. Advised that she could still try since she has a month supply of medication at home.  She is in the donut hole and states that she is willing to pay through if PAP is denied again since [name] is the only medication that has worked well for her with symptom improvement.  Advised that we could provider her sample short-term if needed but she understood that there donut hole must be paid through eventually. Will f/u with patient in 2 weeks if we don't receive application from here  Knox Saliva, PharmD, MPH Clinical Pharmacist (Rheumatology and Pulmonology)

## 2020-07-31 ENCOUNTER — Ambulatory Visit (INDEPENDENT_AMBULATORY_CARE_PROVIDER_SITE_OTHER): Payer: Medicare Other | Admitting: Family Medicine

## 2020-07-31 ENCOUNTER — Ambulatory Visit: Payer: Self-pay

## 2020-07-31 ENCOUNTER — Encounter: Payer: Self-pay | Admitting: Family Medicine

## 2020-07-31 ENCOUNTER — Other Ambulatory Visit: Payer: Self-pay

## 2020-07-31 DIAGNOSIS — R600 Localized edema: Secondary | ICD-10-CM

## 2020-07-31 DIAGNOSIS — M79672 Pain in left foot: Secondary | ICD-10-CM

## 2020-07-31 NOTE — Progress Notes (Signed)
Office Visit Note   Patient: Sherri [name]           Date of Birth: 1941-03-02           MRN: [id number] Visit Date: 07/31/2020 Requested by: Eunice Blase, MD [address] [place],  [place] [address] PCP: Binnie Rail, MD  Subjective: Chief Complaint  Patient presents with  . Left Foot - Pain    Pain "will not go away." Her ankle and foot are swollen - worse at night. Tried the prednisone and colchicine - no relief.    HPI: She is here with persistent left foot pain.  Is been a couple weeks now since the onset of her pain, acute onset while walking at a store.  She tried prednisone and colchicine but nothing seems to be helping.  Pain on the lateral aspect of the ankle and foot, constant but even worse at night.  She has been having a lot of swelling as well.  No personal history of DVT.               ROS:   All other systems were reviewed and are negative.  Objective: Vital Signs: There were no vitals taken for this visit.  Physical Exam:  General:  Alert and oriented, in no acute distress. Pulm:  Breathing unlabored. Psy:  Normal mood, congruent affect. Skin: There is no erythema or warmth in the foot or ankle, but 2+ pitting edema on the lateral aspect of her ankle and into the lateral calf. Left foot: She is tender near the peroneal tendons posterior to the lateral malleolus.  There is also a little bit of tenderness to palpation in the calf muscles.  No palpable cords.  Imaging: XR Foot Complete Left  Result Date: 07/31/2020 X-rays of the left foot reveal a nondisplaced proximal fifth metatarsal fracture, incomplete.  No other acute abnormality seen.  She has osteopenic appearance of the bones.   Assessment & Plan: 1.  Ongoing left foot pain probably due to proximal fifth metatarsal fracture.  She has osteoporosis putting her at risk for this. -Short fracture boot, anticipate 4 to 6 weeks healing time. -Due to the degree of edema, I will also order a D-dimer and  follow it with Doppler studies if positive.     Procedures: No procedures performed        PMFS History: Patient Active Problem List   Diagnosis Date Noted  . Dark urine 04/10/2020  . Mouth sore 04/10/2020  . Abnormal urine odor 04/10/2020  . Dehydration 04/10/2020  . Restless leg 02/02/2020  . Bilateral leg edema 01/11/2020  . Cellulitis of right upper extremity 10/26/2019  . Cervical radiculopathy 06/28/2019  . Carpal tunnel syndrome on both sides 06/28/2019  . Bilateral cataracts 01/11/2019  . Dizziness 01/11/2019  . Right sided sciatica 04/14/2018  . Trochanteric bursitis of right hip 04/14/2018  . S/P cervical spinal fusion 11/10/2017  . Dysphagia 10/26/2017  . Gastroesophageal reflux disease 08/06/2017  . Easy bruising 08/06/2017  . Anxiety 04/24/2017  . Mid back pain 03/26/2017  . Dyspnea on exertion 03/14/2017  . Hyperkalemia 03/13/2017  . Submandibular gland swelling 01/08/2017  . High risk medication use 09/02/2016  . Piriformis muscle pain 09/02/2016  . Weakness 07/22/2016  . Right hip pain 04/08/2016  . Right knee pain 02/25/2016  . Dyspepsia 04/05/2015  . Numbness 11/14/2014  . Biceps tendon tear 10/10/2014  . Insomnia 10/10/2014  . Gait disturbance 06/13/2014  . 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  OP (osteoporosis) 01/18/2014  . Other long term (current) drug therapy 01/18/2014  . Hoarseness 12/29/2013  . 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 Personal history of colonic polyps 07/03/2010  . Nocturia 05/28/2010  . THYROID NODULE 05/17/2008  . Multiple sclerosis ([place]) 05/17/2008  . Other fatigue 03/30/2007  . Hyperlipidemia 08/20/2006  . Essential hypertension 08/20/2006  . Rheumatoid arthritis ([place]) 08/20/2006  . CERVICAL CANCER, HX OF 08/20/2006  . GASTRIC ULCER, HX OF 08/20/2006   Past Medical History:  Diagnosis Date  . Anxiety    takes Valium daily as needed  . Basal cell carcinoma 01/21/1988   left nostril  (MOHS), sup-right calf (CX35FU)  . Basal cell carcinoma 03/22/1996   right calf (CX35FU)  . Basal cell carcinoma 03/31/1995   left wing nose  . Bruises easily    d/t meds  . Cancer ([place])    basal cell ca, in situ- uterine   . Cataracts, bilateral   . Chronic back pain   . Dizziness    r/t to meds  . GERD (gastroesophageal reflux disease)    no meds on a regular basis but will take Tums if needed  . Headache(784.0)    r/t neck issues  . History of bronchitis 6-106yrs ago  . History of colon polyps    benign  . Hyperlipidemia    takes Atorvastatin on Mondays and Fridays  . Hypertension    takes Ramipril daily  . Joint pain   . Joint swelling   . Multiple sclerosis ([place])   . Neuromuscular disorder ([place])    Dr. Park Liter- [facility], follows M.S.  . Nocturia   . OA (osteoarthritis)    Dr Jani Gravel weekly, RA- hands- knees- feet   . PONV (postoperative nausea and vomiting)    trouble urinating after surgery in 2014  . Postoperative nausea and vomiting 01/11/2019  . Rheumatoid arthritis(714.0)    Dr Estanislado Pandy takes [name] daily  . Right wrist fracture   . Skin cancer   . Squamous cell carcinoma of skin 11/02/2001   in situ-right knee (cx24fu)  . Squamous cell carcinoma of skin 11/12/2011   in situ-left forearm (CX35FU), in situ-left foot (CX35FU)  . Squamous cell carcinoma of skin 01/22/2012   in situ-left lower forearm (txpbx)  . Squamous cell carcinoma of skin 11/17/2012   right shin (txpbx)  . Squamous cell carcinoma of skin 10/28/2013   in situ-right shoulder (CX35FU)  . Squamous cell carcinoma of skin 04/28/2014   well diff-right shin (txpbx)  . Squamous cell carcinoma of skin 11/02/2014   in situ-Left shin (txpbx)  . Squamous cell carcinoma of skin 12/15/2014   in situ-Left hand (txpbx), bowens-left side chest (txpbx)  . Squamous cell carcinoma of skin 05/09/2015   in situ-left hand (txpbx)   . Squamous cell carcinoma of skin 05/07/2016   in  situ-left inner shin,ant, in situ-left inner shin, post, in situ-left outer forearm, in situ-right knuckle  . Squamous cell carcinoma of skin 03/1302018   in situ-left shoulder (txpbx), in situ-right inner shin (txpbx), in situ-top of left foot (txpbx), KA- left forearm (txpbx)  . Squamous cell carcinoma of skin 12/02/2016   in situ-left inner shin (txpbx)  . Squamous cell carcinoma of skin 03/04/2017   in situ-left outer sup, shin (txpbx), in situ- right 2nd knuckle finger (txpbx), in situ- Left wrist (txpbx)  . Squamous cell carcinoma of skin 04/06/2018   in situ-above left knee inner (txpbx)  . Squamous cell carcinoma  of skin 04/21/2018   in situ-right top hand (txpbx)  . Squamous cell carcinoma of skin 07/08/2018   in situ-right lower inner shin (txpbx)  . Squamous cell carcinoma of skin 04/27/2019   in situ- Left neck(CX35FU), In situ- right neck (CX35FU)  . Urinary retention    sees Dr.Wrenn about 2 times a yr    Family History  Problem Relation Age of Onset  . Cancer Mother        pancreatic  . Diabetes Mother   . Heart disease Father        Rheumatic  . Cancer Father        ? stomach  . Cancer Sister        stomach  . Cancer Maternal Aunt        X 4; ? primary  . Heart disease Paternal Aunt   . Cancer Maternal Grandmother        cervical  . Colon cancer Other        Aunts  . Multiple sclerosis Daughter   . Hypertension Neg Hx   . Stroke Neg Hx     Past Surgical History:  Procedure Laterality Date  . ABDOMINAL HYSTERECTOMY     1985  . APPENDECTOMY     with TAH  . BACK SURGERY     several  . BREAST BIOPSY Right    Results were negative  . BROW LIFT Bilateral 10/02/2016   Procedure: BILATERAL LOWER LID BLEPHAROPLASTY;  Surgeon: Clista Bernhardt, MD;  Location: [facility];  Service: Plastics;  Laterality: Bilateral;  . CARPAL TUNNEL RELEASE Right   . cataracts    . CERVICAL FUSION     Dr Joya Salm  . CERVICAL FUSION  12/31/2012   Dr Joya Salm  . CHEST TUBE  INSERTION     for traumatic Pneumothorax  . COLONOSCOPY  07/16/2010   normal   . COLONOSCOPY W/ POLYPECTOMY  1997   negative since; Dr Deatra Ina  . ESOPHAGOGASTRODUODENOSCOPY  07/16/2010   normal  . eye lid raise    . EYE SURGERY Bilateral    cataracts removed - /w IOL  . JOINT REPLACEMENT    . LUMBAR FUSION     Dr Joya Salm  . NASAL SINUS SURGERY    . POSTERIOR CERVICAL FUSION/FORAMINOTOMY N/A 12/31/2012   Procedure: POSTERIOR LATERAL CERVICAL FUSION/FORAMINOTOMY LEVEL 1 CERVICAL THREE-FOUR WITH LATERAL MASS SCREWS;  Surgeon: Floyce Stakes, MD;  Location: [place];  Service: Neurosurgery;  Laterality: N/A;  . PTOSIS REPAIR Bilateral 10/02/2016   Procedure: INTERNAL PTOSIS REPAIR;  Surgeon: Clista Bernhardt, MD;  Location: [facility];  Service: Plastics;  Laterality: Bilateral;  . SKIN BIOPSY    . THYROID SURGERY     R lobe removed- 1972, has grown back - CT last done- 2018  . TOTAL HIP ARTHROPLASTY  12/22/2011   Procedure: TOTAL HIP ARTHROPLASTY;  Surgeon: Kerin Salen, MD;  Location: [facility];  Service: Orthopedics;  Laterality: Left;  . TOTAL SHOULDER ARTHROPLASTY    . TUBAL LIGATION     Social History   Occupational History  . Occupation: retired  Tobacco Use  . Smoking status: Former Smoker    Packs/day: 2.50    Years: 20.00    Pack years: 50.00    Types: Cigarettes    Quit date: 05/26/1981    Years since quitting: 39.2  . Smokeless tobacco: Never Used  . Tobacco comment: Smoked [phone], up to 2.5 ppd  Vaping Use  . Vaping Use:  Never used  Substance and Sexual Activity  . Alcohol use: Yes    Alcohol/week: 14.0 standard drinks    Types: 14 Cans of beer per week    Comment: 2% beer  . Drug use: Never  . Sexual activity: Yes

## 2020-08-01 ENCOUNTER — Telehealth: Payer: Self-pay | Admitting: Family Medicine

## 2020-08-01 ENCOUNTER — Ambulatory Visit: Payer: Medicare Other | Admitting: Family Medicine

## 2020-08-01 ENCOUNTER — Ambulatory Visit (HOSPITAL_COMMUNITY)
Admission: RE | Admit: 2020-08-01 | Discharge: 2020-08-01 | Disposition: A | Discharge status: Home / Self Care | Payer: Medicare Other | Source: Ambulatory Visit | Attending: Family Medicine | Admitting: Family Medicine

## 2020-08-01 ENCOUNTER — Encounter: Payer: Self-pay | Admitting: *Deleted

## 2020-08-01 DIAGNOSIS — M79672 Pain in left foot: Secondary | ICD-10-CM

## 2020-08-01 DIAGNOSIS — R7989 Other specified abnormal findings of blood chemistry: Secondary | ICD-10-CM

## 2020-08-01 DIAGNOSIS — R600 Localized edema: Secondary | ICD-10-CM | POA: Diagnosis not present

## 2020-08-01 LAB — D-DIMER, QUANTITATIVE: D-Dimer, Quant: 0.73 mcg/mL FEU — ABNORMAL HIGH (ref <0.50)

## 2020-08-01 NOTE — Telephone Encounter (Signed)
[facility] is [facility] at [facility] pt aware via Mychart

## 2020-08-01 NOTE — Telephone Encounter (Signed)
D-Dimer is positive.  Will order doppler to rule out blood clot.

## 2020-08-01 NOTE — Telephone Encounter (Signed)
No blood clot seen.   

## 2020-08-06 DIAGNOSIS — H5211 Myopia, right eye: Secondary | ICD-10-CM | POA: Diagnosis not present

## 2020-08-06 DIAGNOSIS — H524 Presbyopia: Secondary | ICD-10-CM | POA: Diagnosis not present

## 2020-08-06 DIAGNOSIS — Z961 Presence of intraocular lens: Secondary | ICD-10-CM | POA: Diagnosis not present

## 2020-08-06 DIAGNOSIS — H04123 Dry eye syndrome of bilateral lacrimal glands: Secondary | ICD-10-CM | POA: Diagnosis not present

## 2020-08-07 ENCOUNTER — Other Ambulatory Visit: Payer: Self-pay | Admitting: Rheumatology

## 2020-08-07 ENCOUNTER — Other Ambulatory Visit: Payer: Self-pay | Admitting: Internal Medicine

## 2020-08-07 NOTE — Telephone Encounter (Signed)
Next Visit: 11/28/2020  Last Visit: 06/27/2020  Last Fill: 07/25/2019  Dx: Rheumatoid arthritis involving multiple sites with positive rheumatoid factor   Current Dose per office note on 07/28/4560, folic acid 1 mg by mouth daily  Okay to refill folic acid?

## 2020-08-08 ENCOUNTER — Other Ambulatory Visit: Payer: Self-pay | Admitting: Physician Assistant

## 2020-08-08 DIAGNOSIS — R3914 Feeling of incomplete bladder emptying: Secondary | ICD-10-CM | POA: Diagnosis not present

## 2020-08-08 DIAGNOSIS — N3010 Interstitial cystitis (chronic) without hematuria: Secondary | ICD-10-CM | POA: Diagnosis not present

## 2020-08-08 NOTE — Telephone Encounter (Signed)
Next Visit: 11/28/2020 Last Visit: 06/27/2020  Last Fill: 05/23/2020  DX: Rheumatoid arthritis involving multiple sites with positive rheumatoid factor   Current Dose per office note 06/27/2020, Methotrexate 6 tablets by mouth once weekly  Labs: 06/27/2020, MCV and MCH remain elevated. Hemoglobin and hematocrit are WNL. Folate was WNL on 06/20/19. Reviewed lab work with Dr. Estanislado Pandy.  Please make sure the patient is taking folic acid 2 mg daily. We will continue to monitor. Rest of CBC stable. Globulin is low. Alk phos is low. Please add phosphorus.  Vitamin D WNL. Continue current vitamin D supplement.   Okay to refill MTX?

## 2020-08-09 ENCOUNTER — Ambulatory Visit (INDEPENDENT_AMBULATORY_CARE_PROVIDER_SITE_OTHER): Payer: Medicare Other

## 2020-08-09 ENCOUNTER — Other Ambulatory Visit: Payer: Self-pay

## 2020-08-09 VITALS — BP 105/65 | HR 69 | Temp 98.9°F | Ht 61.0 in | Wt 113.0 lb

## 2020-08-09 DIAGNOSIS — Z0000 Encounter for general adult medical examination without abnormal findings: Secondary | ICD-10-CM | POA: Diagnosis not present

## 2020-08-09 NOTE — Patient Instructions (Addendum)
[name] , Thank you for taking time to come for your Medicare Wellness Visit. I appreciate your ongoing commitment to your health goals. Please review the following plan we discussed and let me know if I can assist you in the future.   Screening recommendations/referrals: Colonoscopy: last done on 07/16/2010; pre-op scheduled for 09/10/2020 Mammogram: 05/2021; done at [facility] Bone Density: 05/2021; done at [facility] Recommended yearly ophthalmology/optometry visit for glaucoma screening and checkup Recommended yearly dental visit for hygiene and checkup  Vaccinations: Influenza vaccine: 02/02/2020 Pneumococcal vaccine: 12/28/2012, 09/08/2014 Tdap vaccine: overdue Shingles vaccine: 02/10/2019, 04/14/2019   Covid-19: 06/18/2019, 07/09/2019, 01/09/2020, 07/03/2020  Advanced directives: Please bring a copy of your health care power of attorney and living will to the office at your convenience.  Conditions/risks identified: Yes; Reviewed health maintenance screenings with patient today and relevant education, vaccines, and/or referrals were provided. Please continue to do your personal lifestyle choices by: daily care of teeth and gums, regular physical activity (goal should be 5 days a week for 30 minutes), eat a healthy diet, avoid tobacco and drug use, limiting any alcohol intake, taking a low-dose aspirin (if not allergic or have been advised by your provider otherwise) and taking vitamins and minerals as recommended by your provider. Continue doing brain stimulating activities (puzzles, reading, adult coloring books, staying active) to keep memory sharp. Continue to eat heart healthy diet (full of fruits, vegetables, whole grains, lean protein, water--limit salt, fat, and sugar intake) and increase physical activity as tolerated.  Next appointment:  Please schedule your next Medicare Wellness Visit with your Nurse Health Advisor in 1 year by calling [phone].   Preventive Care  81 Years and Older, Female Preventive care refers to lifestyle choices and visits with your health care provider that can promote health and wellness. What does preventive care include?  A yearly physical exam. This is also called an annual well check.  Dental exams once or twice a year.  Routine eye exams. Ask your health care provider how often you should have your eyes checked.  Personal lifestyle choices, including:  Daily care of your teeth and gums.  Regular physical activity.  Eating a healthy diet.  Avoiding tobacco and drug use.  Limiting alcohol use.  Practicing safe sex.  Taking low-dose aspirin every day.  Taking vitamin and mineral supplements as recommended by your health care provider. What happens during an annual well check? The services and screenings done by your health care provider during your annual well check will depend on your age, overall health, lifestyle risk factors, and family history of disease. Counseling  Your health care provider may ask you questions about your:  Alcohol use.  Tobacco use.  Drug use.  Emotional well-being.  Home and relationship well-being.  Sexual activity.  Eating habits.  History of falls.  Memory and ability to understand (cognition).  Work and work Statistician.  Reproductive health. Screening  You may have the following tests or measurements:  Height, weight, and BMI.  Blood pressure.  Lipid and cholesterol levels. These may be checked every 5 years, or more frequently if you are over 2 years old.  Skin check.  Lung cancer screening. You may have this screening every year starting at age 49 if you have a 30-pack-year history of smoking and currently smoke or have quit within the past 15 years.  Fecal occult blood test (FOBT) of the stool. You may have this test every year starting at age 28.  Flexible  sigmoidoscopy or colonoscopy. You may have a sigmoidoscopy every 5 years or a colonoscopy  every 10 years starting at age 47.  Hepatitis C blood test.  Hepatitis B blood test.  Sexually transmitted disease (STD) testing.  Diabetes screening. This is done by checking your blood sugar (glucose) after you have not eaten for a while (fasting). You may have this done every 1-3 years.  Bone density scan. This is done to screen for osteoporosis. You may have this done starting at age 29.  Mammogram. This may be done every 1-2 years. Talk to your health care provider about how often you should have regular mammograms. Talk with your health care provider about your test results, treatment options, and if necessary, the need for more tests. Vaccines  Your health care provider may recommend certain vaccines, such as:  Influenza vaccine. This is recommended every year.  Tetanus, diphtheria, and acellular pertussis (Tdap, Td) vaccine. You may need a Td booster every 10 years.  Zoster vaccine. You may need this after age 1.  Pneumococcal 13-valent conjugate (PCV13) vaccine. One dose is recommended after age 68.  Pneumococcal polysaccharide (PPSV23) vaccine. One dose is recommended after age 28. Talk to your health care provider about which screenings and vaccines you need and how often you need them. This information is not intended to replace advice given to you by your health care provider. Make sure you discuss any questions you have with your health care provider. Document Released: 06/08/2015 Document Revised: 01/30/2016 Document Reviewed: 03/13/2015 Elsevier Interactive Patient Education  2017 [employer] Prevention in the Home Falls can cause injuries. They can happen to people of all ages. There are many things you can do to make your home safe and to help prevent falls. What can I do on the outside of my home?  Regularly fix the edges of walkways and driveways and fix any cracks.  Remove anything that might make you trip as you walk through a door, such as a  raised step or threshold.  Trim any bushes or trees on the path to your home.  Use bright outdoor lighting.  Clear any walking paths of anything that might make someone trip, such as rocks or tools.  Regularly check to see if handrails are loose or broken. Make sure that both sides of any steps have handrails.  Any raised decks and porches should have guardrails on the edges.  Have any leaves, snow, or ice cleared regularly.  Use sand or salt on walking paths during winter.  Clean up any spills in your garage right away. This includes oil or grease spills. What can I do in the bathroom?  Use night lights.  Install grab bars by the toilet and in the tub and shower. Do not use towel bars as grab bars.  Use non-skid mats or decals in the tub or shower.  If you need to sit down in the shower, use a plastic, non-slip stool.  Keep the floor dry. Clean up any water that spills on the floor as soon as it happens.  Remove soap buildup in the tub or shower regularly.  Attach bath mats securely with double-sided non-slip rug tape.  Do not have throw rugs and other things on the floor that can make you trip. What can I do in the bedroom?  Use night lights.  Make sure that you have a light by your bed that is easy to reach.  Do not use any sheets or blankets that  are too big for your bed. They should not hang down onto the floor.  Have a firm chair that has side arms. You can use this for support while you get dressed.  Do not have throw rugs and other things on the floor that can make you trip. What can I do in the kitchen?  Clean up any spills right away.  Avoid walking on wet floors.  Keep items that you use a lot in easy-to-reach places.  If you need to reach something above you, use a strong step stool that has a grab bar.  Keep electrical cords out of the way.  Do not use floor polish or wax that makes floors slippery. If you must use wax, use non-skid floor  wax.  Do not have throw rugs and other things on the floor that can make you trip. What can I do with my stairs?  Do not leave any items on the stairs.  Make sure that there are handrails on both sides of the stairs and use them. Fix handrails that are broken or loose. Make sure that handrails are as long as the stairways.  Check any carpeting to make sure that it is firmly attached to the stairs. Fix any carpet that is loose or worn.  Avoid having throw rugs at the top or bottom of the stairs. If you do have throw rugs, attach them to the floor with carpet tape.  Make sure that you have a light switch at the top of the stairs and the bottom of the stairs. If you do not have them, ask someone to add them for you. What else can I do to help prevent falls?  Wear shoes that:  Do not have high heels.  Have rubber bottoms.  Are comfortable and fit you well.  Are closed at the toe. Do not wear sandals.  If you use a stepladder:  Make sure that it is fully opened. Do not climb a closed stepladder.  Make sure that both sides of the stepladder are locked into place.  Ask someone to hold it for you, if possible.  Clearly mark and make sure that you can see:  Any grab bars or handrails.  First and last steps.  Where the edge of each step is.  Use tools that help you move around (mobility aids) if they are needed. These include:  Canes.  Walkers.  Scooters.  Crutches.  Turn on the lights when you go into a dark area. Replace any light bulbs as soon as they burn out.  Set up your furniture so you have a clear path. Avoid moving your furniture around.  If any of your floors are uneven, fix them.  If there are any pets around you, be aware of where they are.  Review your medicines with your doctor. Some medicines can make you feel dizzy. This can increase your chance of falling. Ask your doctor what other things that you can do to help prevent falls. This information is  not intended to replace advice given to you by your health care provider. Make sure you discuss any questions you have with your health care provider. Document Released: 03/08/2009 Document Revised: 10/18/2015 Document Reviewed: 06/16/2014 Elsevier Interactive Patient Education  2017 [employer].

## 2020-08-09 NOTE — Progress Notes (Signed)
I connected with Sherri [name] today by telephone and verified that I am speaking with the correct person using two identifiers. Location patient: home Location provider: work Persons participating in the virtual visit: Sherri [name] and Sherri [name], Sherri [name].   I discussed the limitations, risks, security and privacy concerns of performing an evaluation and management service by telephone and the availability of in person appointments. I also discussed with the patient that there may be a patient responsible charge related to this service. The patient expressed understanding and verbally consented to this telephonic visit.    Interactive audio and video telecommunications were attempted between this provider and patient, however failed, due to patient having technical difficulties OR patient did not have access to video capability.  We continued and completed visit with audio only.  Some vital signs may be absent or patient reported.   Time Spent with patient on telephone encounter: 30 minutes  Subjective:   Sherri [name] is a 80 y.o. female who presents for Medicare Annual (Subsequent) preventive examination.  Review of Systems    No ROS. Medicare Wellness Virtual Visit. Additional risk factors are reflected in social history. Cardiac Risk Factors include: advanced age (>72men, >20 women);dyslipidemia;family history of premature cardiovascular disease;hypertension   Sleep Patterns: No sleep issues, feels rested on waking and sleeps 6-8 hours nightly. Home Safety/Smoke Alarms: Feels safe in home; uses home alarm. Smoke alarms in place. Living environment: 2-story home; Lives with spouse; no needs for DME; good support system. Seat Belt Safety/Bike Helmet: Wears seat belt.    Objective:    Today's Vitals   08/09/20 1259 08/09/20 1300  BP: 105/65   Pulse: 69   Temp: 98.9 F (37.2 C)   SpO2: 98%   Weight: 113 lb (51.3 kg)   Height: 5\' 1"  (1.549 m)   PainSc:  8     Body mass index is 21.35 kg/m.  Advanced Directives 08/09/2020 12/04/2019 10/30/2019 11/25/2018 06/29/2017 12/16/2016 09/30/2016  Does Patient Have a Medical Advance Directive? Yes No Yes No Yes Yes Yes  Type of Advance Directive Living will;Healthcare Power of [place];Living will [place];Living will Living will  Does patient want to make changes to medical advance directive? No - Patient declined - - - - - -  Copy of [place] in Chart? No - copy requested - - - Yes No - copy requested -  Would patient like information on creating a medical advance directive? - No - Patient declined - No - Patient declined - - -    Current Medications (verified) Outpatient Encounter Medications as of 08/09/2020  Medication Sig  . atorvastatin (LIPITOR) 10 MG tablet TAKE 1 TAB ON MONDAYS AND FRIDAY  . Biotin 5000 MCG TABS Take 5,000 mcg by mouth daily.  . Calcium Carbonate-Vitamin D (CALCIUM + D PO) Take 1 tablet by mouth daily.   . Carboxymethylcellul-Glycerin (LUBRICATING EYE DROPS OP) Apply 1 drop to eye daily as needed (dry eyes).  . carvedilol (COREG) 6.25 MG tablet Take 1 tablet (6.25 mg total) by mouth 2 (two) times daily with a meal.  . Cholecalciferol (VITAMIN D3) 2000 units capsule Take 2,000 Units by mouth daily.  . Colchicine (MITIGARE) 0.6 MG CAPS Take 1 capsule by mouth 2 (two) times daily as needed.  . Cyanocobalamin (VITAMIN B-12 PO) Take 1 tablet by mouth daily.  [name] denosumab (PROLIA) 60 MG/ML SOSY injection INJECT 60 MG INTO THE SKIN EVERY  6 (SIX) MONTHS. Please deliver to clinic: [address], [place], [place] [address]  . diazepam (VALIUM) 5 MG tablet Take 1 tablet (5 mg total) by mouth at bedtime.  . diclofenac sodium (VOLTAREN) 1 % GEL APPLY 3 GRAMS TO 3 LARGE JOINTS 3 TIMES A DAY AS NEEDED  . famotidine (PEPCID) 40 MG tablet TAKE 1 TABLET BY MOUTH EVERY DAY  . folic acid (FOLVITE) 1 MG tablet Take 1 tablet (1  mg total) by mouth daily.  . furosemide (LASIX) 20 MG tablet TAKE 1 TABLET BY MOUTH EVERY DAY  . methotrexate (RHEUMATREX) 2.5 MG tablet TAKE 6 TABLETS BY MOUTH ONCE WEEKLY. CAUTION:CHEMOTHERAPY. PROTECT FROM LIGHT.  [name] predniSONE (DELTASONE) 10 MG tablet 4 PO qd x 2 d, then 3 PO qd x 2 d, then 2 PO qd x 2 d, then 1 PO qd x 2 d (Patient not taking: Reported on 07/31/2020)  . predniSONE (DELTASONE) 5 MG tablet TAKE 1 TABLET BY MOUTH EVERY DAY WITH BREAKFAST  . Pyridoxine HCl (VITAMIN B-6 PO) Take 1 tablet by mouth daily.  . traMADol (ULTRAM) 50 MG tablet Take 1 tablet (50 mg total) by mouth 2 (two) times daily.  [name] Upadacitinib ER ([name]) 15 MG TB24 Take 15 mg by mouth daily.   No facility-administered encounter medications on file as of 08/09/2020.    Allergies (verified) Darvon [propoxyphene hcl], Meperidine hcl, Penicillins, Azathioprine, Atorvastatin, Demerol [meperidine], Leflunomide, and Ezetimibe-simvastatin   History: Past Medical History:  Diagnosis Date  . Anxiety    takes Valium daily as needed  . Basal cell carcinoma 01/21/1988   left nostril (MOHS), sup-right calf (CX35FU)  . Basal cell carcinoma 03/22/1996   right calf (CX35FU)  . Basal cell carcinoma 03/31/1995   left wing nose  . Bruises easily    d/t meds  . Cancer ([place])    basal cell ca, in situ- uterine   . Cataracts, bilateral   . Chronic back pain   . Dizziness    r/t to meds  . GERD (gastroesophageal reflux disease)    no meds on a regular basis but will take Tums if needed  . Headache(784.0)    r/t neck issues  . History of bronchitis 6-60yrs ago  . History of colon polyps    benign  . Hyperlipidemia    takes Atorvastatin on Mondays and Fridays  . Hypertension    takes Ramipril daily  . Joint pain   . Joint swelling   . Multiple sclerosis ([place])   . Neuromuscular disorder ([place])    Dr. Park Liter- [facility], follows [facility]  . Nocturia   . OA (osteoarthritis)    Dr Jani Gravel weekly, RA-  hands- knees- feet   . PONV (postoperative nausea and vomiting)    trouble urinating after surgery in 2014  . Postoperative nausea and vomiting 01/11/2019  . Rheumatoid arthritis(714.0)    Dr Estanislado Pandy takes [name] daily  . Right wrist fracture   . Skin cancer   . Squamous cell carcinoma of skin 11/02/2001   in situ-right knee (cx63fu)  . Squamous cell carcinoma of skin 11/12/2011   in situ-left forearm (CX35FU), in situ-left foot (CX35FU)  . Squamous cell carcinoma of skin 01/22/2012   in situ-left lower forearm (txpbx)  . Squamous cell carcinoma of skin 11/17/2012   right shin (txpbx)  . Squamous cell carcinoma of skin 10/28/2013   in situ-right shoulder (CX35FU)  . Squamous cell carcinoma of skin 04/28/2014   well diff-right shin (txpbx)  .  Squamous cell carcinoma of skin 11/02/2014   in situ-Left shin (txpbx)  . Squamous cell carcinoma of skin 12/15/2014   in situ-Left hand (txpbx), bowens-left side chest (txpbx)  . Squamous cell carcinoma of skin 05/09/2015   in situ-left hand (txpbx)   . Squamous cell carcinoma of skin 05/07/2016   in situ-left inner shin,ant, in situ-left inner shin, post, in situ-left outer forearm, in situ-right knuckle  . Squamous cell carcinoma of skin 03/1302018   in situ-left shoulder (txpbx), in situ-right inner shin (txpbx), in situ-top of left foot (txpbx), KA- left forearm (txpbx)  . Squamous cell carcinoma of skin 12/02/2016   in situ-left inner shin (txpbx)  . Squamous cell carcinoma of skin 03/04/2017   in situ-left outer sup, shin (txpbx), in situ- right 2nd knuckle finger (txpbx), in situ- Left wrist (txpbx)  . Squamous cell carcinoma of skin 04/06/2018   in situ-above left knee inner (txpbx)  . Squamous cell carcinoma of skin 04/21/2018   in situ-right top hand (txpbx)  . Squamous cell carcinoma of skin 07/08/2018   in situ-right lower inner shin (txpbx)  . Squamous cell carcinoma of skin 04/27/2019   in situ- Left neck(CX35FU), In  situ- right neck (CX35FU)  . Urinary retention    sees Dr.Wrenn about 2 times a yr   Past Surgical History:  Procedure Laterality Date  . ABDOMINAL HYSTERECTOMY     1985  . APPENDECTOMY     with TAH  . BACK SURGERY     several  . BREAST BIOPSY Right    Results were negative  . BROW LIFT Bilateral 10/02/2016   Procedure: BILATERAL LOWER LID BLEPHAROPLASTY;  Surgeon: Clista Bernhardt, MD;  Location: [facility];  Service: Plastics;  Laterality: Bilateral;  . CARPAL TUNNEL RELEASE Right   . cataracts    . CERVICAL FUSION     Dr Joya Salm  . CERVICAL FUSION  12/31/2012   Dr Joya Salm  . CHEST TUBE INSERTION     for traumatic Pneumothorax  . COLONOSCOPY  07/16/2010   normal   . COLONOSCOPY W/ POLYPECTOMY  1997   negative since; Dr Deatra Ina  . ESOPHAGOGASTRODUODENOSCOPY  07/16/2010   normal  . eye lid raise    . EYE SURGERY Bilateral    cataracts removed - /w IOL  . JOINT REPLACEMENT    . LUMBAR FUSION     Dr Joya Salm  . NASAL SINUS SURGERY    . POSTERIOR CERVICAL FUSION/FORAMINOTOMY N/A 12/31/2012   Procedure: POSTERIOR LATERAL CERVICAL FUSION/FORAMINOTOMY LEVEL 1 CERVICAL THREE-FOUR WITH LATERAL MASS SCREWS;  Surgeon: Floyce Stakes, MD;  Location: [facility];  Service: Neurosurgery;  Laterality: N/A;  . PTOSIS REPAIR Bilateral 10/02/2016   Procedure: INTERNAL PTOSIS REPAIR;  Surgeon: Clista Bernhardt, MD;  Location: [facility];  Service: Plastics;  Laterality: Bilateral;  . SKIN BIOPSY    . THYROID SURGERY     R lobe removed- 1972, has grown back - CT last done- 2018  . TOTAL HIP ARTHROPLASTY  12/22/2011   Procedure: TOTAL HIP ARTHROPLASTY;  Surgeon: Kerin Salen, MD;  Location: [facility];  Service: Orthopedics;  Laterality: Left;  . TOTAL SHOULDER ARTHROPLASTY    . TUBAL LIGATION     Family History  Problem Relation Age of Onset  . Cancer Mother        pancreatic  . Diabetes Mother   . Heart disease Father        Rheumatic  . Cancer Father        ?  stomach  . Cancer Sister         stomach  . Cancer Maternal Aunt        X 4; ? primary  . Heart disease Paternal Aunt   . Cancer Maternal Grandmother        cervical  . Colon cancer Other        Aunts  . Multiple sclerosis Daughter   . Hypertension Neg Hx   . Stroke Neg Hx    Social History   Socioeconomic History  . Marital status: Married    Spouse name: Not on file  . Number of children: Not on file  . Years of education: Not on file  . Highest education level: Not on file  Occupational History  . Occupation: retired  Tobacco Use  . Smoking status: Former Smoker    Packs/day: 2.50    Years: 20.00    Pack years: 50.00    Types: Cigarettes    Quit date: 05/26/1981    Years since quitting: 39.2  . Smokeless tobacco: Never Used  . Tobacco comment: Smoked [phone], up to 2.5 ppd  Vaping Use  . Vaping Use: Never used  Substance and Sexual Activity  . Alcohol use: Yes    Alcohol/week: 14.0 standard drinks    Types: 14 Cans of beer per week    Comment: 2% beer  . Drug use: Never  . Sexual activity: Yes  Other Topics Concern  . Not on file  Social History Narrative  . Not on file   Social Determinants of Health   Financial Resource Strain: Low Risk   . Difficulty of Paying Living Expenses: Not very hard  Food Insecurity: No Food Insecurity  . Worried About Charity fundraiser in the Last Year: Never true  . Ran Out of Food in the Last Year: Never true  Transportation Needs: No Transportation Needs  . Lack of Transportation (Medical): No  . Lack of Transportation (Non-Medical): No  Physical Activity: Inactive  . Days of Exercise per Week: 0 days  . Minutes of Exercise per Session: 0 min  Stress: No Stress Concern Present  . Feeling of Stress : Not at all  Social Connections: Socially Integrated  . Frequency of Communication with Friends and Family: More than three times a week  . Frequency of Social Gatherings with Friends and Family: Once a week  . Attends Religious Services: More than 4  times per year  . Active Member of Clubs or Organizations: Yes  . Attends Archivist Meetings: More than 4 times per year  . Marital Status: Married    Tobacco Counseling Counseling given: Not Answered Comment: Smoked [phone], up to 2.5 ppd   Clinical Intake:  Pre-visit preparation completed: Yes  Pain : 0-10 Pain Score: 8  Pain Type: Acute pain Pain Location: Foot (fractured left foot; in a boot) Pain Orientation: Left Pain Radiating Towards: n/a Pain Descriptors / Indicators: Constant,Discomfort,Throbbing Pain Onset: 1 to 4 weeks ago Pain Frequency: Constant Pain Relieving Factors: Tramadol Effect of Pain on Daily Activities: Pain can diminish job performance, lower motivation to exercise, and prevent you from completing daily tasks. Pain produces disability and affects the quality of life.  Pain Relieving Factors: Tramadol  BMI - recorded: 21.35 Nutritional Status: BMI of 19-24  Normal Nutritional Risks: Unintentional weight loss Diabetes: No  How often do you need to have someone help you when you read instructions, pamphlets, or other written materials from your doctor or pharmacy?: 1 -  Never What is the last grade level you completed in school?: Bachelor's Degree in Accounting  Diabetic? No  Interpreter Needed?: No  Information entered by :: Lisette Abu, LPN   Activities of Daily Living In your present state of health, do you have any difficulty performing the following activities: 08/09/2020 04/10/2020  Hearing? N N  Vision? N N  Difficulty concentrating or making decisions? N N  Walking or climbing stairs? N N  Dressing or bathing? N N  Doing errands, shopping? N N  Preparing Food and eating ? N -  Using the Toilet? N -  In the past six months, have you accidently leaked urine? Y -  Do you have problems with loss of bowel control? N -  Managing your Medications? N -  Managing your Finances? N -  Housekeeping or managing your  Housekeeping? N -  Some recent data might be hidden    Patient Care Team: Binnie Rail, MD as PCP - General (Internal Medicine) Sater, Nanine Means, MD (Neurology) Bo Merino, MD as Consulting Physician (Rheumatology) Charlton Haws, [facility] as Pharmacist (Pharmacist) Warren Danes, PA-C as Physician Assistant (Dermatology)  Indicate any recent Medical Services you may have received from other than [facility] providers in the past year (date may be approximate).     Assessment:   This is a routine wellness examination for Sherri.  Hearing/Vision screen No exam data present  Dietary issues and exercise activities discussed: Current Exercise Habits: The patient does not participate in regular exercise at present, Exercise limited by: neurologic condition(s)  Goals    . golf or dancing     Relaxation and playing golf when able as well as dancing     . I want to  play golf, do suspended yoga, and line dance    . Patient Stated     I want to be pain free.    [name] Pharmacy Care Plan     CARE PLAN ENTRY  Current Barriers:  . Chronic Disease Management support, education, and care coordination needs related to Hypertension, Hyperlipidemia, and Osteoporosis   Hypertension BP Readings from Last 3 Encounters:  02/29/20 133/78  02/02/20 126/69  01/11/20 114/72 .  Pharmacist Clinical Goal(s): o Over the next 180 days, patient will work with PharmD and providers to maintain BP goal <130/80 . Current regimen:  o Carvedilol 6.25 mg twice a day . Interventions: o Discussed BP goal and benefits of medication for prevention of heart attack and stroke . Patient self care activities - Over the next 180 days, patient will: o Check BP 1-2x per week, document, and provide at future appointments o Ensure daily salt intake < 2300 mg/day  Hyperlipidemia Lab Results  Component Value Date/Time   LDLCALC 96 08/05/2018 10:25 AM   LDLDIRECT 122.9 11/15/2012 02:01 PM .  Pharmacist  Clinical Goal(s): o Over the next 180 days, patient will work with PharmD and providers to maintain LDL goal < 100 . Current regimen:  o Atorvastatin 10 mg Mon and Fri . Interventions: o Discussed cholesterol goal and benefits of statin medication for prevention of heart attack and stroke . Patient self care activities - Over the next 180 days, patient will: o Continue medication as prescribed o Continue low cholesterol diet  Osteoporosis . Pharmacist Clinical Goal(s) o Over the next 180 days, patient will work with PharmD and providers to optimize bone health . Current regimen:  o Calcium carbonate 600 mg o Vitamin D 2000 IU daily o Prolia 60  mg q6 months (last given 12/08/19) . Interventions: o Discussed the human body can absorb only 600 mg of calcium at a time; counseled to take calcium twice a day to get 1200 mg total each day o Discussed Prolia will be due again mid January; it may be appropriate to delay 1-2 months until she reaches catastrophic coverage to avoid high copays . Patient self care activities - Over the next 180 days, patient will: o Take calcium 600 mg twice a day (separated by 8-12 hrs) o Discuss delaying next Prolia dose with prescriber  Medication management . Pharmacist Clinical Goal(s): o Over the next 180 days, patient will work with PharmD and providers to maintain optimal medication adherence . Current pharmacy: [facility] . Interventions o Comprehensive medication review performed. o Continue current medication management strategy . Patient self care activities - Over the next 180 days, patient will: o Focus on medication adherence by patient report o Take medications as prescribed o Report any questions or concerns to PharmD and/or provider(s)  Please see past updates related to this goal by clicking on the "Past Updates" button in the selected goal       Depression Screen PHQ 2/9 Scores 08/09/2020 10/26/2019 12/16/2016 10/21/2016 10/21/2016 05/07/2015  09/08/2014  PHQ - 2 Score 0 0 0 0 0 0 0  PHQ- 9 Score - - 1 1 - - -    Fall Risk Fall Risk  08/09/2020 10/26/2019 12/16/2016 10/21/2016 05/07/2015  Falls in the past year? 0 0 No No No  Number falls in past yr: 0 0 - - -  Injury with Fall? 0 0 - - -  Risk for fall due to : No Fall Risks - Impaired balance/gait - -  Follow up Falls evaluation completed Falls evaluation completed - - -    FALL RISK PREVENTION PERTAINING TO THE HOME:  Any stairs in or around the home? Yes  If so, are there any without handrails? No  Home free of loose throw rugs in walkways, pet beds, electrical cords, etc? Yes  Adequate lighting in your home to reduce risk of falls? Yes   ASSISTIVE DEVICES UTILIZED TO PREVENT FALLS:  Life alert? No  Use of a cane, walker or w/c? No  Grab bars in the bathroom? Yes  Shower chair or bench in shower? Yes  Elevated toilet seat or a handicapped toilet? Yes   TIMED UP AND GO:  Was the test performed? No .  Length of time to ambulate 10 feet: 0 sec.   Gait slow and steady with assistive device  Cognitive Function: No flowsheet data found.         Immunizations Immunization History  Administered Date(s) Administered  . Fluad Quad(high Dose 65+) 02/02/2020  . Influenza Whole 02/22/2007, 02/08/2013  . Influenza, High Dose Seasonal PF 02/04/2015, 02/18/2017, 02/21/2018, 01/16/2019  . Influenza-Unspecified 01/24/2014, 01/25/2016, 02/18/2017, 01/16/2019  . PFIZER(Purple Top)SARS-COV-2 Vaccination 06/18/2019, 07/09/2019, 01/09/2020, 07/03/2020  . Pneumococcal Conjugate-13 11/23/2012, 09/08/2014  . Pneumococcal Polysaccharide-23 05/26/2006  . Pneumococcal-Unspecified 12/28/2012  . Td 05/28/2010  . Zoster 05/26/2009  . Zoster Recombinat (Shingrix) 02/10/2019, 04/14/2019    TDAP status: Due, Education has been provided regarding the importance of this vaccine. Advised may receive this vaccine at local pharmacy or Health Dept. Aware to provide a copy of the vaccination  record if obtained from local pharmacy or Health Dept. Verbalized acceptance and understanding.  Flu Vaccine status: Up to date  Pneumococcal vaccine status: Up to date  Covid-19 vaccine status: Completed vaccines  Qualifies for Shingles Vaccine? Yes   Zostavax completed Yes   Shingrix Completed?: Yes  Screening Tests Health Maintenance  Topic Date Due  . Hepatitis C Screening  Never done  . TETANUS/TDAP  05/28/2020  . INFLUENZA VACCINE  Completed  . DEXA SCAN  Completed  . COVID-19 Vaccine  Completed  . PNA vac Low Risk Adult  Completed  . HPV VACCINES  Aged Out    Health Maintenance  Health Maintenance Due  Topic Date Due  . Hepatitis C Screening  Never done  . TETANUS/TDAP  05/28/2020    Colorectal cancer screening: Type of screening: Colonoscopy. Completed 07/16/2010. Repeat every 10 years  (scheduled for 4/128/2022)  Mammogram status: Completed 05/2021. Repeat every year  Bone Density status: Completed 05/2021. Results reflect: Bone density results: OSTEOPOROSIS. Repeat every 2 years.  Lung Cancer Screening: (Low Dose CT Chest recommended if Age 21-80 years, 30 pack-year currently smoking OR have quit w/in 15years.) does not qualify.   Lung Cancer Screening Referral: no  Additional Screening:  Hepatitis C Screening: does not qualify; Completed no  Vision Screening: Recommended annual ophthalmology exams for early detection of glaucoma and other disorders of the eye. Is the patient up to date with their annual eye exam?  Yes  Who is the provider or what is the name of the office in which the patient attends annual eye exams? Katy Apo, MD. If pt is not established with a provider, would they like to be referred to a provider to establish care? No .   Dental Screening: Recommended annual dental exams for proper oral hygiene  Community Resource Referral / Chronic Care Management: CRR required this visit?  No   CCM required this visit?  No      Plan:      I have personally reviewed and noted the following in the patient's chart:   . Medical and social history . Use of alcohol, tobacco or illicit drugs  . Current medications and supplements . Functional ability and status . Nutritional status . Physical activity . Advanced directives . List of other physicians . Hospitalizations, surgeries, and ER visits in previous 12 months . Vitals . Screenings to include cognitive, depression, and falls . Referrals and appointments  In addition, I have reviewed and discussed with patient certain preventive protocols, quality metrics, and best practice recommendations. A written personalized care plan for preventive services as well as general preventive health recommendations were provided to patient.     Sheral Flow, LPN   11/23/1599   Nurse Notes:  Patient is cogitatively intact. There were no vitals filed for this visit. There is no height or weight on file to calculate BMI. Medications reviewed with patient; no opioid use noted.

## 2020-08-09 NOTE — Patient Instructions (Addendum)
Blood work was ordered.     Medications changes include :  none   Your prescription(s) have been submitted to your pharmacy. Please take as directed and contact our office if you believe you are having problem(s) with the medication(s).    Please followup in 6 months    Health Maintenance, Female Adopting a healthy lifestyle and getting preventive care are important in promoting health and wellness. Ask your health care provider about:  The right schedule for you to have regular tests and exams.  Things you can do on your own to prevent diseases and keep yourself healthy. What should I know about diet, weight, and exercise? Eat a healthy diet  Eat a diet that includes plenty of vegetables, fruits, low-fat dairy products, and lean protein.  Do not eat a lot of foods that are high in solid fats, added sugars, or sodium.   Maintain a healthy weight Body mass index (BMI) is used to identify weight problems. It estimates body fat based on height and weight. Your health care provider can help determine your BMI and help you achieve or maintain a healthy weight. Get regular exercise Get regular exercise. This is one of the most important things you can do for your health. Most adults should:  Exercise for at least 150 minutes each week. The exercise should increase your heart rate and make you sweat (moderate-intensity exercise).  Do strengthening exercises at least twice a week. This is in addition to the moderate-intensity exercise.  Spend less time sitting. Even light physical activity can be beneficial. Watch cholesterol and blood lipids Have your blood tested for lipids and cholesterol at 79 years of age, then have this test every 5 years. Have your cholesterol levels checked more often if:  Your lipid or cholesterol levels are high.  You are older than 79 years of age.  You are at high risk for heart disease. What should I know about cancer screening? Depending on your  health history and family history, you may need to have cancer screening at various ages. This may include screening for:  Breast cancer.  Cervical cancer.  Colorectal cancer.  Skin cancer.  Lung cancer. What should I know about heart disease, diabetes, and high blood pressure? Blood pressure and heart disease  High blood pressure causes heart disease and increases the risk of stroke. This is more likely to develop in people who have high blood pressure readings, are of African descent, or are overweight.  Have your blood pressure checked: ? Every 3-5 years if you are 18-39 years of age. ? Every year if you are 40 years old or older. Diabetes Have regular diabetes screenings. This checks your fasting blood sugar level. Have the screening done:  Once every three years after age 40 if you are at a normal weight and have a low risk for diabetes.  More often and at a younger age if you are overweight or have a high risk for diabetes. What should I know about preventing infection? Hepatitis B If you have a higher risk for hepatitis B, you should be screened for this virus. Talk with your health care provider to find out if you are at risk for hepatitis B infection. Hepatitis C Testing is recommended for:  Everyone born from 1945 through 1965.  Anyone with known risk factors for hepatitis C. Sexually transmitted infections (STIs)  Get screened for STIs, including gonorrhea and chlamydia, if: ? You are sexually active and are younger than 79 years of   age. ? You are older than 79 years of age and your health care provider tells you that you are at risk for this type of infection. ? Your sexual activity has changed since you were last screened, and you are at increased risk for chlamydia or gonorrhea. Ask your health care provider if you are at risk.  Ask your health care provider about whether you are at high risk for HIV. Your health care provider may recommend a prescription  medicine to help prevent HIV infection. If you choose to take medicine to prevent HIV, you should first get tested for HIV. You should then be tested every 3 months for as long as you are taking the medicine. Pregnancy  If you are about to stop having your period (premenopausal) and you may become pregnant, seek counseling before you get pregnant.  Take 400 to 800 micrograms (mcg) of folic acid every day if you become pregnant.  Ask for birth control (contraception) if you want to prevent pregnancy. Osteoporosis and menopause Osteoporosis is a disease in which the bones lose minerals and strength with aging. This can result in bone fractures. If you are 65 years old or older, or if you are at risk for osteoporosis and fractures, ask your health care provider if you should:  Be screened for bone loss.  Take a calcium or vitamin D supplement to lower your risk of fractures.  Be given hormone replacement therapy (HRT) to treat symptoms of menopause. Follow these instructions at home: Lifestyle  Do not use any products that contain nicotine or tobacco, such as cigarettes, e-cigarettes, and chewing tobacco. If you need help quitting, ask your health care provider.  Do not use street drugs.  Do not share needles.  Ask your health care provider for help if you need support or information about quitting drugs. Alcohol use  Do not drink alcohol if: ? Your health care provider tells you not to drink. ? You are pregnant, may be pregnant, or are planning to become pregnant.  If you drink alcohol: ? Limit how much you use to 0-1 drink a day. ? Limit intake if you are breastfeeding.  Be aware of how much alcohol is in your drink. In the [place] one drink equals one 12 oz bottle of beer (355 mL), one 5 oz glass of wine (148 mL), or one 1 oz glass of hard liquor (44 mL). General instructions  Schedule regular health, dental, and eye exams.  Stay current with your vaccines.  Tell your health  care provider if: ? You often feel depressed. ? You have ever been abused or do not feel safe at home. Summary  Adopting a healthy lifestyle and getting preventive care are important in promoting health and wellness.  Follow your health care provider's instructions about healthy diet, exercising, and getting tested or screened for diseases.  Follow your health care provider's instructions on monitoring your cholesterol and blood pressure. This information is not intended to replace advice given to you by your health care provider. Make sure you discuss any questions you have with your health care provider. Document Revised: 05/05/2018 Document Reviewed: 05/05/2018 Elsevier Patient Education  2021 Elsevier Inc.  

## 2020-08-09 NOTE — Progress Notes (Signed)
Subjective:    Patient ID: Sherri [name], female    DOB: 1940/06/05, 80 y.o.   MRN: [id number]   This visit occurred during the SARS-CoV-2 public health emergency.  Safety protocols were in place, including screening questions prior to the visit, additional usage of staff PPE, and extensive cleaning of exam room while observing appropriate contact time as indicated for disinfecting solutions.    HPI She is here for a physical exam.   She broke her left 5th metatarsal.  She is following with ortho.    Her joint pain is not good.     Medications and allergies reviewed with patient and updated if appropriate.  Patient Active Problem List   Diagnosis Date Noted  . Restless leg 02/02/2020  . Bilateral leg edema 01/11/2020  . Cervical radiculopathy 06/28/2019  . Carpal tunnel syndrome on both sides 06/28/2019  . Right sided sciatica 04/14/2018  . Trochanteric bursitis of right hip 04/14/2018  . S/P cervical spinal fusion 11/10/2017  . Dysphagia 10/26/2017  . Gastroesophageal reflux disease 08/06/2017  . Easy bruising 08/06/2017  . Mid back pain 03/26/2017  . Submandibular gland swelling 01/08/2017  . High risk medication use 09/02/2016  . Piriformis muscle pain 09/02/2016  . Right hip pain 04/08/2016  . Right knee pain 02/25/2016  . Numbness 11/14/2014  . Biceps tendon tear 10/10/2014  . Insomnia 10/10/2014  . Gait disturbance 06/13/2014  . OP (osteoporosis) 01/18/2014  . Other long term (current) drug therapy 01/18/2014  . Hoarseness 12/29/2013  . 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 Personal history of colonic polyps 07/03/2010  . Nocturia 05/28/2010  . THYROID NODULE 05/17/2008  . Multiple sclerosis ([place]) 05/17/2008  . Other fatigue 03/30/2007  . Hyperlipidemia 08/20/2006  . Essential hypertension 08/20/2006  . Rheumatoid arthritis ([place]) 08/20/2006  . CERVICAL CANCER, HX OF 08/20/2006  . GASTRIC ULCER, HX OF 08/20/2006    Current  Outpatient Medications on File Prior to Visit  Medication Sig Dispense Refill  . atorvastatin (LIPITOR) 10 MG tablet TAKE 1 TAB ON MONDAYS AND FRIDAY 27 tablet 3  . Biotin 5000 MCG TABS Take 5,000 mcg by mouth daily.    . Calcium Carbonate-Vitamin D (CALCIUM + D PO) Take 1 tablet by mouth daily.     . Carboxymethylcellul-Glycerin (LUBRICATING EYE DROPS OP) Apply 1 drop to eye daily as needed (dry eyes).    . carvedilol (COREG) 6.25 MG tablet Take 1 tablet (6.25 mg total) by mouth 2 (two) times daily with a meal. 180 tablet 1  . Cholecalciferol (VITAMIN D3) 2000 units capsule Take 2,000 Units by mouth daily.    . Colchicine (MITIGARE) 0.6 MG CAPS Take 1 capsule by mouth 2 (two) times daily as needed. 60 capsule 3  . Cyanocobalamin (VITAMIN B-12 PO) Take 1 tablet by mouth daily.    [name] denosumab (PROLIA) 60 MG/ML SOSY injection INJECT 60 MG INTO THE SKIN EVERY 6 (SIX) MONTHS. Please deliver to clinic: [address], [place], [place] [address] 1 mL 1  . diazepam (VALIUM) 5 MG tablet Take 1 tablet (5 mg total) by mouth at bedtime. 30 tablet 5  . diclofenac sodium (VOLTAREN) 1 % GEL APPLY 3 GRAMS TO 3 LARGE JOINTS 3 TIMES A DAY AS NEEDED 100 g 0  . famotidine (PEPCID) 40 MG tablet TAKE 1 TABLET BY MOUTH EVERY DAY 90 tablet 1  . folic acid (FOLVITE) 1 MG tablet Take 1 tablet (1 mg total) by mouth daily. [place]  tablet 3  . furosemide (LASIX) 20 MG tablet TAKE 1 TABLET BY MOUTH EVERY DAY 90 tablet 1  . methotrexate (RHEUMATREX) 2.5 MG tablet TAKE 6 TABLETS BY MOUTH ONCE WEEKLY. CAUTION:CHEMOTHERAPY. PROTECT FROM LIGHT. 72 tablet 0  . predniSONE (DELTASONE) 5 MG tablet TAKE 1 TABLET BY MOUTH EVERY DAY WITH BREAKFAST 90 tablet 0  . Pyridoxine HCl (VITAMIN B-6 PO) Take 1 tablet by mouth daily.    . traMADol (ULTRAM) 50 MG tablet Take 1 tablet (50 mg total) by mouth 2 (two) times daily. 60 tablet 5  . Upadacitinib ER ([name]) 15 MG TB24 Take 15 mg by mouth daily. 60 tablet 1   No current  facility-administered medications on file prior to visit.    Past Medical History:  Diagnosis Date  . Anxiety    takes Valium daily as needed  . Basal cell carcinoma 01/21/1988   left nostril (MOHS), sup-right calf (CX35FU)  . Basal cell carcinoma 03/22/1996   right calf (CX35FU)  . Basal cell carcinoma 03/31/1995   left wing nose  . Bruises easily    d/t meds  . Cancer ([place])    basal cell ca, in situ- uterine   . Cataracts, bilateral   . Chronic back pain   . Dizziness    r/t to meds  . GERD (gastroesophageal reflux disease)    no meds on a regular basis but will take Tums if needed  . Headache(784.0)    r/t neck issues  . History of bronchitis 6-32yrs ago  . History of colon polyps    benign  . Hyperlipidemia    takes Atorvastatin on Mondays and Fridays  . Hypertension    takes Ramipril daily  . Joint pain   . Joint swelling   . Multiple sclerosis ([place])   . Neuromuscular disorder ([place])    Dr. Park Liter- [facility], follows [facility]  . Nocturia   . OA (osteoarthritis)    Dr Jani Gravel weekly, RA- hands- knees- feet   . PONV (postoperative nausea and vomiting)    trouble urinating after surgery in 2014  . Postoperative nausea and vomiting 01/11/2019  . Rheumatoid arthritis(714.0)    Dr Estanislado Pandy takes [name] daily  . Right wrist fracture   . Skin cancer   . Squamous cell carcinoma of skin 11/02/2001   in situ-right knee (cx93fu)  . Squamous cell carcinoma of skin 11/12/2011   in situ-left forearm (CX35FU), in situ-left foot (CX35FU)  . Squamous cell carcinoma of skin 01/22/2012   in situ-left lower forearm (txpbx)  . Squamous cell carcinoma of skin 11/17/2012   right shin (txpbx)  . Squamous cell carcinoma of skin 10/28/2013   in situ-right shoulder (CX35FU)  . Squamous cell carcinoma of skin 04/28/2014   well diff-right shin (txpbx)  . Squamous cell carcinoma of skin 11/02/2014   in situ-Left shin (txpbx)  . Squamous cell carcinoma of skin  12/15/2014   in situ-Left hand (txpbx), bowens-left side chest (txpbx)  . Squamous cell carcinoma of skin 05/09/2015   in situ-left hand (txpbx)   . Squamous cell carcinoma of skin 05/07/2016   in situ-left inner shin,ant, in situ-left inner shin, post, in situ-left outer forearm, in situ-right knuckle  . Squamous cell carcinoma of skin 03/1302018   in situ-left shoulder (txpbx), in situ-right inner shin (txpbx), in situ-top of left foot (txpbx), KA- left forearm (txpbx)  . Squamous cell carcinoma of skin 12/02/2016   in situ-left inner shin (txpbx)  . Squamous cell carcinoma of  skin 03/04/2017   in situ-left outer sup, shin (txpbx), in situ- right 2nd knuckle finger (txpbx), in situ- Left wrist (txpbx)  . Squamous cell carcinoma of skin 04/06/2018   in situ-above left knee inner (txpbx)  . Squamous cell carcinoma of skin 04/21/2018   in situ-right top hand (txpbx)  . Squamous cell carcinoma of skin 07/08/2018   in situ-right lower inner shin (txpbx)  . Squamous cell carcinoma of skin 04/27/2019   in situ- Left neck(CX35FU), In situ- right neck (CX35FU)  . Urinary retention    sees Dr.Wrenn about 2 times a yr    Past Surgical History:  Procedure Laterality Date  . ABDOMINAL HYSTERECTOMY     1985  . APPENDECTOMY     with TAH  . BACK SURGERY     several  . BREAST BIOPSY Right    Results were negative  . BROW LIFT Bilateral 10/02/2016   Procedure: BILATERAL LOWER LID BLEPHAROPLASTY;  Surgeon: Clista Bernhardt, MD;  Location: [facility];  Service: Plastics;  Laterality: Bilateral;  . CARPAL TUNNEL RELEASE Right   . cataracts    . CERVICAL FUSION     Dr Joya Salm  . CERVICAL FUSION  12/31/2012   Dr Joya Salm  . CHEST TUBE INSERTION     for traumatic Pneumothorax  . COLONOSCOPY  07/16/2010   normal   . COLONOSCOPY W/ POLYPECTOMY  1997   negative since; Dr Deatra Ina  . ESOPHAGOGASTRODUODENOSCOPY  07/16/2010   normal  . eye lid raise    . EYE SURGERY Bilateral    cataracts removed - /w  IOL  . JOINT REPLACEMENT    . LUMBAR FUSION     Dr Joya Salm  . NASAL SINUS SURGERY    . POSTERIOR CERVICAL FUSION/FORAMINOTOMY N/A 12/31/2012   Procedure: POSTERIOR LATERAL CERVICAL FUSION/FORAMINOTOMY LEVEL 1 CERVICAL THREE-FOUR WITH LATERAL MASS SCREWS;  Surgeon: Floyce Stakes, MD;  Location: [facility];  Service: Neurosurgery;  Laterality: N/A;  . PTOSIS REPAIR Bilateral 10/02/2016   Procedure: INTERNAL PTOSIS REPAIR;  Surgeon: Clista Bernhardt, MD;  Location: [facility];  Service: Plastics;  Laterality: Bilateral;  . SKIN BIOPSY    . THYROID SURGERY     R lobe removed- 1972, has grown back - CT last done- 2018  . TOTAL HIP ARTHROPLASTY  12/22/2011   Procedure: TOTAL HIP ARTHROPLASTY;  Surgeon: Kerin Salen, MD;  Location: [facility];  Service: Orthopedics;  Laterality: Left;  . TOTAL SHOULDER ARTHROPLASTY    . TUBAL LIGATION      Social History   Socioeconomic History  . Marital status: Married    Spouse name: Not on file  . Number of children: Not on file  . Years of education: Not on file  . Highest education level: Not on file  Occupational History  . Occupation: retired  Tobacco Use  . Smoking status: Former Smoker    Packs/day: 2.50    Years: 20.00    Pack years: 50.00    Types: Cigarettes    Quit date: 05/26/1981    Years since quitting: 39.2  . Smokeless tobacco: Never Used  . Tobacco comment: Smoked [phone], up to 2.5 ppd  Vaping Use  . Vaping Use: Never used  Substance and Sexual Activity  . Alcohol use: Yes    Alcohol/week: 14.0 standard drinks    Types: 14 Cans of beer per week    Comment: 2% beer  . Drug use: Never  . Sexual activity: Yes  Other Topics Concern  .  Not on file  Social History Narrative  . Not on file   Social Determinants of Health   Financial Resource Strain: Low Risk   . Difficulty of Paying Living Expenses: Not very hard  Food Insecurity: No Food Insecurity  . Worried About Charity fundraiser in the Last Year: Never true  . Ran Out  of Food in the Last Year: Never true  Transportation Needs: No Transportation Needs  . Lack of Transportation (Medical): No  . Lack of Transportation (Non-Medical): No  Physical Activity: Inactive  . Days of Exercise per Week: 0 days  . Minutes of Exercise per Session: 0 min  Stress: No Stress Concern Present  . Feeling of Stress : Not at all  Social Connections: Socially Integrated  . Frequency of Communication with Friends and Family: More than three times a week  . Frequency of Social Gatherings with Friends and Family: Once a week  . Attends Religious Services: More than 4 times per year  . Active Member of Clubs or Organizations: Yes  . Attends Archivist Meetings: More than 4 times per year  . Marital Status: Married    Family History  Problem Relation Age of Onset  . Cancer Mother        pancreatic  . Diabetes Mother   . Heart disease Father        Rheumatic  . Cancer Father        ? stomach  . Cancer Sister        stomach  . Cancer Maternal Aunt        X 4; ? primary  . Heart disease Paternal Aunt   . Cancer Maternal Grandmother        cervical  . Colon cancer Other        Aunts  . Multiple sclerosis Daughter   . Hypertension Neg Hx   . Stroke Neg Hx     Review of Systems  Constitutional: Negative for chills and fever.  Eyes: Negative for visual disturbance.  Respiratory: Negative for cough, shortness of breath and wheezing.   Cardiovascular: Positive for leg swelling. Negative for chest pain and palpitations.  Gastrointestinal: Positive for constipation. Negative for abdominal pain, blood in stool, diarrhea and nausea.       Gerd not controlled  Genitourinary: Positive for difficulty urinating (at times).       Incomplete emptying bladder  Musculoskeletal: Positive for arthralgias.  Skin: Negative for rash.  Neurological: Negative for light-headedness and headaches.  Psychiatric/Behavioral: Negative for dysphoric mood. The patient is not  nervous/anxious.        Objective:   Vitals:   08/10/20 1500  BP: 110/78  Pulse: 68  Temp: 98.1 F (36.7 C)  SpO2: 99%   There were no vitals filed for this visit. Body mass index is 21.35 kg/m.  BP Readings from Last 3 Encounters:  08/10/20 110/78  08/09/20 105/65  07/05/20 (!) 101/53    Wt Readings from Last 3 Encounters:  08/09/20 113 lb (51.3 kg)  07/03/20 118 lb (53.5 kg)  06/27/20 120 lb 3.2 oz (54.5 kg)    Depression screen [facility] 2/9 08/10/2020 08/09/2020 10/26/2019  Decreased Interest 0 0 0  Down, Depressed, Hopeless 0 0 0  PHQ - 2 Score 0 0 0  Altered sleeping 1 - -  Tired, decreased energy 0 - -  Change in appetite 0 - -  Feeling bad or failure about yourself  0 - -  Trouble concentrating 0 - -  Moving slowly or fidgety/restless 0 - -  Suicidal thoughts 0 - -  PHQ-9 Score 1 - -  Difficult doing work/chores Not difficult at all - -  Some recent data might be hidden    GAD 7 : Generalized Anxiety Score 08/10/2020  Nervous, Anxious, on Edge 0  Control/stop worrying 0  Worry too much - different things 0  Trouble relaxing 0  Restless 0  Easily annoyed or irritable 0  Afraid - awful might happen 0  Total GAD 7 Score 0       Physical Exam Constitutional: She appears well-developed and well-nourished. No distress.  HENT:  Head: Normocephalic and atraumatic.  Right Ear: External ear normal. Normal ear canal and TM Left Ear: External ear normal.  Normal ear canal and TM Mouth/Throat: Oropharynx is clear and moist.  Eyes: Conjunctivae and EOM are normal.  Neck: Neck supple. No tracheal deviation present. No thyromegaly present.  No carotid bruit  Cardiovascular: Normal rate, regular rhythm and normal heart sounds.   No murmur heard.  No edema. Pulmonary/Chest: Effort normal and breath sounds normal. No respiratory distress. She has no wheezes. She has no rales.  Breast: deferred   Abdominal: Soft. She exhibits no distension. There is no tenderness.   Lymphadenopathy: She has no cervical adenopathy.  Skin: Skin is warm and dry. She is not diaphoretic.  Psychiatric: She has a normal mood and affect. Her behavior is normal.        Assessment & Plan:   Physical exam: Screening blood work    reviewed Immunizations  Discussed tdap, others up to date Colonoscopy  n/a Mammogram  Up to date - [facility]  Dr Matthew Saras - up to date Dexa  Monitored by Dr Matthew Saras - Up to date  Eye exams  Up to date  Exercise  None - due to joint pain Weight     Normal Substance abuse   none   Screened for depression using the PHQ 9 scale.  No evidence of depression.   Screening for anxiety using the GAD-7 scale showed no evidence of anxiety.   See Problem List for Assessment and Plan of chronic medical problems.

## 2020-08-10 ENCOUNTER — Ambulatory Visit (INDEPENDENT_AMBULATORY_CARE_PROVIDER_SITE_OTHER): Payer: Medicare Other | Admitting: Internal Medicine

## 2020-08-10 ENCOUNTER — Ambulatory Visit: Payer: Medicare Other

## 2020-08-10 ENCOUNTER — Encounter: Payer: Self-pay | Admitting: Internal Medicine

## 2020-08-10 ENCOUNTER — Other Ambulatory Visit: Payer: Self-pay

## 2020-08-10 VITALS — BP 110/78 | HR 68 | Temp 98.1°F | Ht 61.0 in

## 2020-08-10 DIAGNOSIS — I10 Essential (primary) hypertension: Secondary | ICD-10-CM | POA: Diagnosis not present

## 2020-08-10 DIAGNOSIS — Z1331 Encounter for screening for depression: Secondary | ICD-10-CM | POA: Diagnosis not present

## 2020-08-10 DIAGNOSIS — E7849 Other hyperlipidemia: Secondary | ICD-10-CM

## 2020-08-10 DIAGNOSIS — Z1339 Encounter for screening examination for other mental health and behavioral disorders: Secondary | ICD-10-CM | POA: Diagnosis not present

## 2020-08-10 DIAGNOSIS — Z0000 Encounter for general adult medical examination without abnormal findings: Secondary | ICD-10-CM | POA: Diagnosis not present

## 2020-08-10 DIAGNOSIS — R600 Localized edema: Secondary | ICD-10-CM | POA: Diagnosis not present

## 2020-08-10 DIAGNOSIS — K219 Gastro-esophageal reflux disease without esophagitis: Secondary | ICD-10-CM

## 2020-08-10 NOTE — Assessment & Plan Note (Signed)
Chronic Stable, controlled Continue Lasix 20 mg daily

## 2020-08-10 NOTE — Assessment & Plan Note (Signed)
Chronic Check lipid panel  Continue atorvastatin 10 mg BIW Regular exercise and healthy diet encouraged

## 2020-08-10 NOTE — Assessment & Plan Note (Addendum)
Chronic GERD not ideally controlled - was better controlled on omeprazole Seeing GI and will be having a EGD Continue pepcid 40 mg daily

## 2020-08-10 NOTE — Assessment & Plan Note (Signed)
Chronic BP well controlled Continue lasix 20 mg daily, coreg  6.25 mg bid cmp

## 2020-08-17 ENCOUNTER — Other Ambulatory Visit (HOSPITAL_COMMUNITY): Payer: Self-pay

## 2020-08-20 ENCOUNTER — Telehealth: Payer: Self-pay | Admitting: Rheumatology

## 2020-08-20 NOTE — Telephone Encounter (Signed)
Please advise 

## 2020-08-20 NOTE — Telephone Encounter (Signed)
Please advise. Thank you

## 2020-08-20 NOTE — Telephone Encounter (Signed)
Patient calling in reference to patient assistance denial notice she received. Patient only has enough Renvoq for 1 week. Please call to advise.

## 2020-08-21 ENCOUNTER — Other Ambulatory Visit: Payer: Self-pay | Admitting: Neurology

## 2020-08-22 ENCOUNTER — Other Ambulatory Visit: Payer: Self-pay | Admitting: Neurology

## 2020-08-22 ENCOUNTER — Telehealth: Payer: Self-pay | Admitting: *Deleted

## 2020-08-22 ENCOUNTER — Other Ambulatory Visit: Payer: Self-pay | Admitting: Physician Assistant

## 2020-08-22 MED ORDER — RINVOQ 15 MG PO TB24: 15.0000 mg | ORAL_TABLET | Freq: Every day | ORAL | 0 refills | Status: DC

## 2020-08-22 MED ORDER — TRAMADOL HCL 50 MG PO TABS: 50.0000 mg | ORAL_TABLET | Freq: Two times a day (BID) | ORAL | 2 refills | Status: DC

## 2020-08-22 NOTE — Telephone Encounter (Signed)
Patient reports increased pain and has remained adherent to [name]. No missed doses.  States she has increased her tramadol dose from 2 tablets to 3 tablets daily.  Patient broke her left foot which is also contributing to her pain but states she has pain everywhere.  Recently had prednisone taper pack on 08/09/20.  Patient unable to take TNFi d/t multiple sclerosis. Taking MTX 15mg  weekly - in the past, kidney function worsened with increased dose.  F/u is rescheduled to 09/04/20  Knox Saliva, PharmD, MPH Clinical Pharmacist (Rheumatology and Pulmonology)

## 2020-08-22 NOTE — Telephone Encounter (Signed)
Medication Samples have been provided to the patient.  Drug name: [name] Strength: 15 mg Qty: 1 LOT: [id number] Exp.Date: 01/20/2022  Dosing instructions: Take 15 mg by mouth daily.

## 2020-08-22 NOTE — Telephone Encounter (Signed)
Pt request refill traMADol (ULTRAM) 50 MG tablet at [id number]

## 2020-08-22 NOTE — Addendum Note (Signed)
Addended by: Cassandria Anger on: 08/22/2020 10:36 AM   Modules accepted: Orders

## 2020-08-22 NOTE — Telephone Encounter (Signed)
ATC patient to return call about [name] - patient had discussed that if she was denied again, she'd be willing to pay her way through the donut hole. Can provide [name] sample if needed in the interim until she schedules shipment of medicine to home

## 2020-08-22 NOTE — Telephone Encounter (Addendum)
Patient received notification that [name] PAP was denied again d/t income. Sample label in cabinet for patient as she has <1 week supply remaining.  Rx sent to [facility] for [name] for 1 month only - patient has f/u in April with Hazel Sams, PA-C, scheduled  [place] Saliva, PharmD, MPH Clinical Pharmacist (Rheumatology and Pulmonology)

## 2020-08-23 ENCOUNTER — Other Ambulatory Visit (INDEPENDENT_AMBULATORY_CARE_PROVIDER_SITE_OTHER): Payer: Medicare Other

## 2020-08-23 DIAGNOSIS — E7849 Other hyperlipidemia: Secondary | ICD-10-CM

## 2020-08-23 LAB — LIPID PANEL
Cholesterol: 196 mg/dL (ref 0–200)
HDL: 84 mg/dL (ref >39.00)
LDL Cholesterol: 92 mg/dL (ref 0–99)
NonHDL: 111.61
Total CHOL/HDL Ratio: 2
Triglycerides: 100 mg/dL (ref 0.0–149.0)
VLDL: 20 mg/dL (ref 0.0–40.0)

## 2020-08-23 NOTE — Progress Notes (Signed)
Office Visit Note  Patient: Sherri [name]             Date of Birth: 25-Aug-1940           MRN: [id number]             PCP: Binnie Rail, MD Referring: Binnie Rail, MD Visit Date: 09/04/2020 Occupation: @GUAROCC @  Subjective:  Discuss treatment options   History of Present Illness: Sherri [name] is a 80 y.o. female with history of seropositive rheumatoid arthritis, osteoporosis, DDD, and osteoarthritis. She is taking rinvoq 15 mg 1 tablet by mouth daily, methotrexate 6 tablets by mouth once weekly, folic acid 2 mg by mouth daily, and prednisone 5 mg 1 tablet by mouth daily.  She has not missed any doses recently.  She has been experiencing increased pain in multiple joints over the past several months. She is currently having pain in both hands, both wrist joints, both elbow joints.  She has noticed intermittent swelling in both hands.  She has also had increased difficulty gripping and performing fine motor movements.  She has been using Voltaren gel topically as needed for pain relief.  She was prescribed a prednisone taper on 08/09/20 which improved her symptoms temporarily.  She has also had to increase her dose of tramadol from 2 tablets daily to 3 tablets daily for pain management. In the past her creatinine increased on the higher dose of MTX. She presents today to discuss different treatment options. She previously had an inadequate response of xeljanz, oluminant. She developed weakness while taking imuran and discontinued arava due to intolerance.  She is not a candidate for TNFI due to underlying history of multiple sclerosis.  According to the patient she presented to Dr. Karma Greaser office on 08/20/2020 with severe pain in the left foot.  According to the patient she was walking out of [facility] and felt a sharp pain in her left foot but did not have any other injury prior to the onset of symptoms.  She was started on a course of colchicine which did not improve her symptoms so she  schedule a follow-up visit with Dr. Junius Roads again on 07/31/2020.  She was found to have a nondisplaced proximal fifth metatarsal fracture on x-ray at that visit.  She was placed in a short fracture boot for about 4 to 5 weeks.  Her discomfort has resolved. She received her last Prolia injection on 07/05/2020.  She continues to take a calcium and vitamin D supplement on a daily basis.     Activities of Daily Living:  Patient reports morning stiffness for several  hours.   Patient Reports nocturnal pain.  Difficulty dressing/grooming: Denies Difficulty climbing stairs: Reports Difficulty getting out of chair: Denies Difficulty using hands for taps, buttons, cutlery, and/or writing: Reports  Review of Systems  Constitutional: Positive for fatigue.  HENT: Positive for mouth dryness and nose dryness. Negative for mouth sores.   Eyes: Positive for dryness. Negative for pain and itching.  Respiratory: Negative for shortness of breath and difficulty breathing.   Cardiovascular: Negative for chest pain and palpitations.  Gastrointestinal: Positive for constipation. Negative for blood in stool and diarrhea.  Endocrine: Negative for increased urination.  Genitourinary: Negative for difficulty urinating.  Musculoskeletal: Positive for arthralgias, joint pain, joint swelling, myalgias, morning stiffness, muscle tenderness and myalgias.  Skin: Negative for color change, rash and redness.  Allergic/Immunologic: Positive for susceptible to infections.  Neurological: Negative for dizziness, numbness, headaches, memory loss and weakness.  Hematological: Positive for bruising/bleeding tendency.  Psychiatric/Behavioral: Negative for confusion.    PMFS History:  Patient Active Problem List   Diagnosis Date Noted  . Restless leg 02/02/2020  . Bilateral leg edema 01/11/2020  . Cervical radiculopathy 06/28/2019  . Carpal tunnel syndrome on both sides 06/28/2019  . Right sided sciatica 04/14/2018  .  Trochanteric bursitis of right hip 04/14/2018  . S/P cervical spinal fusion 11/10/2017  . Dysphagia 10/26/2017  . Gastroesophageal reflux disease 08/06/2017  . Easy bruising 08/06/2017  . Mid back pain 03/26/2017  . Submandibular gland swelling 01/08/2017  . High risk medication use 09/02/2016  . Piriformis muscle pain 09/02/2016  . Right hip pain 04/08/2016  . Right knee pain 02/25/2016  . Numbness 11/14/2014  . Biceps tendon tear 10/10/2014  . Insomnia 10/10/2014  . Gait disturbance 06/13/2014  . OP (osteoporosis) 01/18/2014  . Other long term (current) drug therapy 01/18/2014  . Hoarseness 12/29/2013  . 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 Personal history of colonic polyps 07/03/2010  . Nocturia 05/28/2010  . THYROID NODULE 05/17/2008  . Multiple sclerosis ([place]) 05/17/2008  . Other fatigue 03/30/2007  . Hyperlipidemia 08/20/2006  . Essential hypertension 08/20/2006  . Rheumatoid arthritis ([place]) 08/20/2006  . CERVICAL CANCER, HX OF 08/20/2006  . GASTRIC ULCER, HX OF 08/20/2006    Past Medical History:  Diagnosis Date  . Anxiety    takes Valium daily as needed  . Basal cell carcinoma 01/21/1988   left nostril (MOHS), sup-right calf (CX35FU)  . Basal cell carcinoma 03/22/1996   right calf (CX35FU)  . Basal cell carcinoma 03/31/1995   left wing nose  . Bruises easily    d/t meds  . Cancer ([place])    basal cell ca, in situ- uterine   . Cataracts, bilateral   . Chronic back pain   . Dizziness    r/t to meds  . GERD (gastroesophageal reflux disease)    no meds on a regular basis but will take Tums if needed  . Headache(784.0)    r/t neck issues  . History of bronchitis 6-71yrs ago  . History of colon polyps    benign  . Hyperlipidemia    takes Atorvastatin on Mondays and Fridays  . Hypertension    takes Ramipril daily  . Joint pain   . Joint swelling   . Multiple sclerosis ([place])   . Neuromuscular disorder ([place])    Dr. Park Liter- [facility], follows [facility]  . Nocturia   . OA (osteoarthritis)    Dr Jani Gravel weekly, RA- hands- knees- feet   . PONV (postoperative nausea and vomiting)    trouble urinating after surgery in 2014  . Postoperative nausea and vomiting 01/11/2019  . Rheumatoid arthritis(714.0)    Dr Estanislado Pandy takes [name] daily  . Right wrist fracture   . Skin cancer   . Squamous cell carcinoma of skin 11/02/2001   in situ-right knee (cx66fu)  . Squamous cell carcinoma of skin 11/12/2011   in situ-left forearm (CX35FU), in situ-left foot (CX35FU)  . Squamous cell carcinoma of skin 01/22/2012   in situ-left lower forearm (txpbx)  . Squamous cell carcinoma of skin 11/17/2012   right shin (txpbx)  . Squamous cell carcinoma of skin 10/28/2013   in situ-right shoulder (CX35FU)  . Squamous cell carcinoma of skin 04/28/2014   well diff-right shin (txpbx)  . Squamous cell carcinoma of skin 11/02/2014   in situ-Left shin (txpbx)  . Squamous cell carcinoma of skin  12/15/2014   in situ-Left hand (txpbx), bowens-left side chest (txpbx)  . Squamous cell carcinoma of skin 05/09/2015   in situ-left hand (txpbx)   . Squamous cell carcinoma of skin 05/07/2016   in situ-left inner shin,ant, in situ-left inner shin, post, in situ-left outer forearm, in situ-right knuckle  . Squamous cell carcinoma of skin 03/1302018   in situ-left shoulder (txpbx), in situ-right inner shin (txpbx), in situ-top of left foot (txpbx), KA- left forearm (txpbx)  . Squamous cell carcinoma of skin 12/02/2016   in situ-left inner shin (txpbx)  . Squamous cell carcinoma of skin 03/04/2017   in situ-left outer sup, shin (txpbx), in situ- right 2nd knuckle finger (txpbx), in situ- Left wrist (txpbx)  . Squamous cell carcinoma of skin 04/06/2018   in situ-above left knee inner (txpbx)  . Squamous cell carcinoma of skin 04/21/2018   in situ-right top hand (txpbx)  . Squamous cell carcinoma of skin 07/08/2018   in situ-right lower  inner shin (txpbx)  . Squamous cell carcinoma of skin 04/27/2019   in situ- Left neck(CX35FU), In situ- right neck (CX35FU)  . Urinary retention    sees Dr.Wrenn about 2 times a yr    Family History  Problem Relation Age of Onset  . Cancer Mother        pancreatic  . Diabetes Mother   . Heart disease Father        Rheumatic  . Cancer Father        ? stomach  . Cancer Sister        stomach  . Cancer Maternal Aunt        X 4; ? primary  . Heart disease Paternal Aunt   . Cancer Maternal Grandmother        cervical  . Colon cancer Other        Aunts  . Multiple sclerosis Daughter   . Hypertension Neg Hx   . Stroke Neg Hx    Past Surgical History:  Procedure Laterality Date  . ABDOMINAL HYSTERECTOMY     1985  . APPENDECTOMY     with TAH  . BACK SURGERY     several  . BREAST BIOPSY Right    Results were negative  . BROW LIFT Bilateral 10/02/2016   Procedure: BILATERAL LOWER LID BLEPHAROPLASTY;  Surgeon: Clista Bernhardt, MD;  Location: [facility];  Service: Plastics;  Laterality: Bilateral;  . CARPAL TUNNEL RELEASE Right   . cataracts    . CERVICAL FUSION     Dr Joya Salm  . CERVICAL FUSION  12/31/2012   Dr Joya Salm  . CHEST TUBE INSERTION     for traumatic Pneumothorax  . COLONOSCOPY  07/16/2010   normal   . COLONOSCOPY W/ POLYPECTOMY  1997   negative since; Dr Deatra Ina  . ESOPHAGOGASTRODUODENOSCOPY  07/16/2010   normal  . eye lid raise    . EYE SURGERY Bilateral    cataracts removed - /w IOL  . JOINT REPLACEMENT    . LUMBAR FUSION     Dr Joya Salm  . NASAL SINUS SURGERY    . POSTERIOR CERVICAL FUSION/FORAMINOTOMY N/A 12/31/2012   Procedure: POSTERIOR LATERAL CERVICAL FUSION/FORAMINOTOMY LEVEL 1 CERVICAL THREE-FOUR WITH LATERAL MASS SCREWS;  Surgeon: Floyce Stakes, MD;  Location: [facility];  Service: Neurosurgery;  Laterality: N/A;  . PTOSIS REPAIR Bilateral 10/02/2016   Procedure: INTERNAL PTOSIS REPAIR;  Surgeon: Clista Bernhardt, MD;  Location: [facility];  Service:  Plastics;  Laterality: Bilateral;  .  SKIN BIOPSY    . THYROID SURGERY     R lobe removed- 1972, has grown back - CT last done- 2018  . TOTAL HIP ARTHROPLASTY  12/22/2011   Procedure: TOTAL HIP ARTHROPLASTY;  Surgeon: Kerin Salen, MD;  Location: [facility];  Service: Orthopedics;  Laterality: Left;  . TOTAL SHOULDER ARTHROPLASTY    . TUBAL LIGATION     Social History   Social History Narrative  . Not on file   Immunization History  Administered Date(s) Administered  . Fluad Quad(high Dose 65+) 02/02/2020  . Influenza Whole 02/22/2007, 02/08/2013  . Influenza, High Dose Seasonal PF 02/04/2015, 02/18/2017, 02/21/2018, 01/16/2019  . Influenza-Unspecified 01/24/2014, 01/25/2016, 02/18/2017, 01/16/2019  . PFIZER(Purple Top)SARS-COV-2 Vaccination 06/18/2019, 07/09/2019, 01/09/2020, 07/03/2020  . Pneumococcal Conjugate-13 11/23/2012, 09/08/2014  . Pneumococcal Polysaccharide-23 05/26/2006  . Pneumococcal-Unspecified 12/28/2012  . Td 05/28/2010  . Zoster 05/26/2009  . Zoster Recombinat (Shingrix) 02/10/2019, 04/14/2019     Objective: Vital Signs: BP 116/65 (BP Location: Left Arm, Patient Position: Sitting, Cuff Size: Normal)   Pulse 69   Resp 14   Ht 5' 3.5" (1.613 m)   Wt 118 lb (53.5 kg)   BMI 20.57 kg/m    Physical Exam Vitals and nursing note reviewed.  Constitutional:      Appearance: She is well-developed.  HENT:     Head: Normocephalic and atraumatic.  Eyes:     Conjunctiva/sclera: Conjunctivae normal.  Pulmonary:     Effort: Pulmonary effort is normal.  Abdominal:     Palpations: Abdomen is soft.  Musculoskeletal:     Cervical back: Normal range of motion.  Skin:    General: Skin is warm and dry.     Capillary Refill: Capillary refill takes less than 2 seconds.  Neurological:     Mental Status: She is alert and oriented to person, place, and time.  Psychiatric:        Behavior: Behavior normal.      Musculoskeletal Exam: C-spine limited ROM with lateral ROM.  Thoracic and lumbar spine discomfort with ROM. Shoulder joints, elbow joints, wrist joints, MCPs, PIPs, and DIPs good ROM.  Tenderness and synovitis of the right 2nd and 3rd MCPs and left 4th PIP joints.  PIP and DIP thickening consistent with osteoarthritis of both hands.  Right hip has good ROM with no discomfort. Left hip replacement has good ROM with no discomfort. Knee joints good ROM with no warmth or effusion.  Ankle joints good ROM with mild tenderness bilaterally.   CDAI Exam: CDAI Score: 7  Patient Global: 5 mm; Provider Global: 5 mm Swollen: 3 ; Tender: 3  Joint Exam 09/04/2020      Right  Left  MCP 2  Swollen Tender     MCP 3  Swollen Tender     PIP 4     Swollen Tender     Investigation: No additional findings.  Imaging: XR Foot Complete Left  Result Date: 08/28/2020 Proximal 5th metatarsal fracture is healing with good callous formation.  XR Thoracic Spine 2 View  Result Date: 08/28/2020 Possible mild compression fractures of around T7 and T8 on AP view, compared to x-rays from 2018.   Recent Labs: Lab Results  Component Value Date   WBC 3.9 06/27/2020   HGB 12.4 06/27/2020   PLT 285 06/27/2020   NA 140 06/27/2020   K 4.9 06/27/2020   CL 101 06/27/2020   CO2 29 06/27/2020   GLUCOSE 94 06/27/2020   BUN 15 06/27/2020   CREATININE  0.86 06/27/2020   BILITOT 0.6 06/27/2020   ALKPHOS 29 (L) 04/10/2020   AST 28 06/27/2020   ALT 25 06/27/2020   PROT 6.5 06/27/2020   ALBUMIN 4.2 04/10/2020   CALCIUM 10.0 06/27/2020   GFRAA 75 06/27/2020   QFTBGOLDPLUS NEGATIVE 10/04/2019    Speciality Comments: Prior therapy includes: [name] (cost), [name] (cost)   Procedures:  No procedures performed Allergies: Darvon [propoxyphene hcl], Meperidine hcl, Penicillins, Azathioprine, Atorvastatin, Demerol [meperidine], Leflunomide, and Ezetimibe-simvastatin      Assessment / Plan:     Visit Diagnoses: Rheumatoid arthritis involving multiple sites with positive  rheumatoid factor ([place]) -She presents today with tenderness and synovitis of the right 2nd and 3rd MCPs and left 4th PIP joint. She has been experiencing recurrent flares in both hands, both wrists, and both elbows over the past several months.  She is taking rinvoq 15 mg 1 tablet by mouth daily, methotrexate 6 tablets by mouth once weekly, folic acid 2 mg by mouth daily, and prednisone 5 mg 1 tablet by mouth daily.  She has not missed any doses recently.  She has also had increased difficulty gripping and performing fine motor movements.  She was given a jar grippers to assist at home.  We discussed the importance of joint protection and muscle strengthening.  She has been using Voltaren gel topically as needed for pain relief and has had to increase her dose of tramadol from 2 tablets to 3 tablets by mouth daily.  She presented today to discuss the symptoms she has been experiencing and different treatment options. In the past her creatinine increased on the higher dose of MTX so we discussed the hesitancy of increasing her methotrexate dose at this time.  We discussed switching from oral to injectable methotrexate to try to increase the efficacy.  A new prescription for methotrexate 0.6 mL sq injections was sent to the pharmacy. She previously had an inadequate response of xeljanz, oluminant. She developed weakness while taking imuran and discontinued arava due to intolerance.  She is not a candidate for TNFI due to underlying history of multiple sclerosis. She will remain on rinvoq as prescribed.  A sample of rinvoq was provided to the patient today in the office.  She was advised to notify us if she does not notice any improvement switching from oral to injectable MTX.  She will follow up in 3 months.   High risk medication use - [name] 15 mg 1 tablet by mouth daily, switching to injectable Methotrexate 0.6 ml sq injections once weekly, and continuing on folic acid 1 mg p.o. daily.  She will continue on  prednisone 5 mg 1 tablet by mouth daily. CBC and CMP updated on 06/27/20.  She will be due to update lab work in May and every 3 months.  Standing orders for CBC and CMP are in place.  TB gold negative on 10/04/19.  Future order for TB gold will be placed today.  Lipid panel WNL on 08/23/20.  - Plan: QuantiFERON-TB Gold Plus Association of heart disease with rheumatoid arthritis was discussed. Need to monitor blood pressure, cholesterol, and to exercise 30-60 minutes on daily basis was discussed. Discussed the black box warning for jak inhibitors for increased risk for MACE and malignancy.  She has not had any recent infections.  Advised to hold rinvoq and MTX if she develops signs or symptoms of an infection and to resume once the infection has completely cleared.    She has received 4 covid-19 vaccine doses.  Screening for tuberculosis - Future order for TB gold placed today. Plan: QuantiFERON-TB Gold Plus  Age-related osteoporosis without current pathological fracture - DEXA: 01/16/2020 T-score: -4.2. Hx of stress fractures. Most recent fracture: 07/31/20 nondisplaced proximal fifth metatarsal fracture, incomplete. She wore a short fracture boot for 4-5 weeks and had repeat x-rays on 08/28/20 revealing healing fracture wit good callous formation.  Her most recent prolia injection was on 07/05/20 in our office.  She continues to take a calcium and vitamin D supplement on a daily basis.   Vitamin D deficiency: She is taking a vitamin D supplement daily.   Primary osteoarthritis of right hip: She has good ROM with no discomfort.  No groin pain at this time.   History of total hip replacement, left - Dr. Mayer Camel. Doing well.  She has good ROM on exam today.   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: She has limited ROM on exam.  No symptoms of radiculopathy at this time.   DDD (degenerative disc disease), lumbar: Chronic pain.  Closed fracture of right wrist with routine healing, subsequent encounter:  Healed.   Other fatigue: Chronic, stable. Discussed the importance of regular exercise.   History of hyperlipidemia: She is taking Lipitor as prescribed.  Lipid panel within normal limits on 08/23/2020.  History of hypertension: BP was well controlled-116/65 today in the office.  Discussed the importance of close blood pressure monitoring.  Primary insomnia: She continues to experience nocturnal pain which contributes to her insomnia.  Other medical conditions are listed as follows:  History of depression  History of multiple sclerosis ([place]): She is not a good candidate for TNF inhibitors.   History of anxiety     Orders: Orders Placed This Encounter  Procedures  . QuantiFERON-TB Gold Plus   Meds ordered this encounter  Medications  . TUBERCULIN SYR 1CC/27GX1/2" 27G X 1/2" 1 ML MISC    Sig: Use 1 syringe once weekly to inject methotrexate.    Dispense:  12 each    Refill:  3  . methotrexate 50 MG/2ML injection    Sig: Inject 0.16mL into the skin once weekly.    Dispense:  8 mL    Refill:  0    Follow-Up Instructions: Return in about 3 months (around 12/04/2020) for Rheumatoid arthritis, Osteoporosis, DDD.   Ofilia Neas, PA-C  Note - This record has been created using Dragon software.  Chart creation errors have been sought, but may not always  have been located. Such creation errors do not reflect on  the standard of medical care.

## 2020-08-28 ENCOUNTER — Ambulatory Visit (INDEPENDENT_AMBULATORY_CARE_PROVIDER_SITE_OTHER): Payer: Medicare Other | Admitting: Family Medicine

## 2020-08-28 ENCOUNTER — Ambulatory Visit: Payer: Self-pay

## 2020-08-28 ENCOUNTER — Encounter: Payer: Self-pay | Admitting: Family Medicine

## 2020-08-28 ENCOUNTER — Other Ambulatory Visit: Payer: Self-pay

## 2020-08-28 DIAGNOSIS — M546 Pain in thoracic spine: Secondary | ICD-10-CM

## 2020-08-28 DIAGNOSIS — M79672 Pain in left foot: Secondary | ICD-10-CM | POA: Diagnosis not present

## 2020-08-28 NOTE — Progress Notes (Signed)
Office Visit Note   Patient: Sherri [name]           Date of Birth: [birth year]           MRN: [id number] Visit Date: 08/28/2020 Requested by: Binnie Rail, MD [employer] [facility],  [place] [id number] PCP: Binnie Rail, MD  Subjective: Chief Complaint  Patient presents with  . Left Foot - Pain, Follow-up    Has been out of the short cam boot x 2 days - "the boot was starting to be more painful than the foot." Foot is still sore in the lateral aspect, but she can walk on it without pain. No swelling.  . Middle Back - Pain    Yesterday, while sitting in the tub, she turned/twisted upper body to reach behind her and heard what sounded "like a twig snapping." Pain just below the shoulder blades - especially with big breaths.    HPI: 80yo F presenting to clinic with follow up on left foot metatarsal fracture- but also with concerns of new thoracic back pain.  Per her foot, patient states that she feels as though it is healing well- and has been out of her boot for several days. She says her foot feels much better out of the boot, and she is hoping not to have to go back to it. She says the foot is still a little tender to direct palpation, but otherwise she feels fine walking on it.  Per her back, patient says she was getting out of the bathtub yesterday when she heard a 'snap' and had sudden pain in her mid-thoracic spine. She says this pain is terrible with deep breathing, and she is scared she broke another bone. She has a history of severe osteoporosis, which has been worsened with prolonged steroid use due to RA.               ROS:   All other systems were reviewed and are negative.  Objective: Vital Signs: There were no vitals taken for this visit.  Physical Exam:  General:  Alert and oriented, in no acute distress. Pulm:  Breathing unlabored. Psy:  Normal mood, congruent affect. Skin:  No bruising, no rashes.    MSK: Normal Gait.  Normal Spinal curvature, without  excessive lumbar lordosis, thoracic kyphosis, or scoliosis. Sits with excellent posture.   Back Palpation: Significant tenderness just to the right of midline at the level of T7-T8 vertebrae. There is some paraspinal tenderness as well in this regions. No obvious step offs, or spinous process tenderness.   Foot Palpation: Tenderness over the 5th metatarsal on left foot. Some tenderness over 4th as well, though this is less significant. No other tenderness throughout midfoot or ankle.   Strength: Foot Inversion (L4), Dorsiflexion (L5), and Eversion (S1) 5/5 Bilaterally Sensation: Intact to light touch medial and lateral aspects of lower extremities, and lateral, dorsal, and medial aspects of foot.  Brisk capillary refill in all toes.      Imaging: XR Foot Complete Left  Result Date: 08/28/2020 Proximal 5th metatarsal fracture is healing with good callous formation.  XR Thoracic Spine 2 View  Result Date: 08/28/2020 Possible mild compression fractures of around T7 and T8 on AP view, compared to x-rays from 2018.   Assessment & Plan: 80yo F presenting to clinic to follow up on left 5th metatarsal fracture, as well as with new thoracic back pain.  5th Metatarsal fracture: Healing well, with callous formation on the Xray. Continue  to wear supportive shoes, though she is safe to fully come out of her waling boot.  - Given callous on XR, anticipate continued healing, with full improvement of symptoms within another 2-3 weeks.  Thoracic Pain: Xray comparison with previous thoracic images in 2018 demonstrate loss of vertebral height at the area of her maximal tenderness (Both in T7 and T8). Difficult to determine if these are truly acute. No obvious rib fractures identified. - Pain management. Has tramadol at home. Continue this for now. - Discussed deep breathing exercises to prevent atelectasis - If pain remains significant in 1-2 weeks, RTC for consideration of PT referral.  Patient  expresses understanding with plan. She has no further questions or concerns.      Procedures: No procedures performed        PMFS History: Patient Active Problem List   Diagnosis Date Noted  . Restless leg 02/02/2020  . Bilateral leg edema 01/11/2020  . Cervical radiculopathy 06/28/2019  . Carpal tunnel syndrome on both sides 06/28/2019  . Right sided sciatica 04/14/2018  . Trochanteric bursitis of right hip 04/14/2018  . S/P cervical spinal fusion 11/10/2017  . Dysphagia 10/26/2017  . Gastroesophageal reflux disease 08/06/2017  . Easy bruising 08/06/2017  . Mid back pain 03/26/2017  . Submandibular gland swelling 01/08/2017  . High risk medication use 09/02/2016  . Piriformis muscle pain 09/02/2016  . Right hip pain 04/08/2016  . Right knee pain 02/25/2016  . Numbness 11/14/2014  . Biceps tendon tear 10/10/2014  . Insomnia 10/10/2014  . Gait disturbance 06/13/2014  . OP (osteoporosis) 01/18/2014  . Other long term (current) drug therapy 01/18/2014  . Hoarseness 12/29/2013  . 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 Personal history of colonic polyps 07/03/2010  . Nocturia 05/28/2010  . THYROID NODULE 05/17/2008  . Multiple sclerosis ([place]) 05/17/2008  . Other fatigue 03/30/2007  . Hyperlipidemia 08/20/2006  . Essential hypertension 08/20/2006  . Rheumatoid arthritis ([place]) 08/20/2006  . CERVICAL CANCER, HX OF 08/20/2006  . GASTRIC ULCER, HX OF 08/20/2006   Past Medical History:  Diagnosis Date  . Anxiety    takes Valium daily as needed  . Basal cell carcinoma 01/21/1988   left nostril (MOHS), sup-right calf (CX35FU)  . Basal cell carcinoma 03/22/1996   right calf (CX35FU)  . Basal cell carcinoma 03/31/1995   left wing nose  . Bruises easily    d/t meds  . Cancer ([place])    basal cell ca, in situ- uterine   . Cataracts, bilateral   . Chronic back pain   . Dizziness    r/t to meds  . GERD (gastroesophageal reflux disease)    no meds on a  regular basis but will take Tums if needed  . Headache(784.0)    r/t neck issues  . History of bronchitis 6-99yrs ago  . History of colon polyps    benign  . Hyperlipidemia    takes Atorvastatin on Mondays and Fridays  . Hypertension    takes Ramipril daily  . Joint pain   . Joint swelling   . Multiple sclerosis ([place])   . Neuromuscular disorder ([place])    Dr. Park Liter- [facility], follows M.S.  . Nocturia   . OA (osteoarthritis)    Dr Jani Gravel weekly, RA- hands- knees- feet   . PONV (postoperative nausea and vomiting)    trouble urinating after surgery in 2014  . Postoperative nausea and vomiting 01/11/2019  . Rheumatoid arthritis(714.0)    Dr Estanislado Pandy takes  [name] daily  . Right wrist fracture   . Skin cancer   . Squamous cell carcinoma of skin 11/02/2001   in situ-right knee (cx53fu)  . Squamous cell carcinoma of skin 11/12/2011   in situ-left forearm (CX35FU), in situ-left foot (CX35FU)  . Squamous cell carcinoma of skin 01/22/2012   in situ-left lower forearm (txpbx)  . Squamous cell carcinoma of skin 11/17/2012   right shin (txpbx)  . Squamous cell carcinoma of skin 10/28/2013   in situ-right shoulder (CX35FU)  . Squamous cell carcinoma of skin 04/28/2014   well diff-right shin (txpbx)  . Squamous cell carcinoma of skin 11/02/2014   in situ-Left shin (txpbx)  . Squamous cell carcinoma of skin 12/15/2014   in situ-Left hand (txpbx), bowens-left side chest (txpbx)  . Squamous cell carcinoma of skin 05/09/2015   in situ-left hand (txpbx)   . Squamous cell carcinoma of skin 05/07/2016   in situ-left inner shin,ant, in situ-left inner shin, post, in situ-left outer forearm, in situ-right knuckle  . Squamous cell carcinoma of skin 03/1302018   in situ-left shoulder (txpbx), in situ-right inner shin (txpbx), in situ-top of left foot (txpbx), KA- left forearm (txpbx)  . Squamous cell carcinoma of skin 12/02/2016   in situ-left inner shin (txpbx)  .  Squamous cell carcinoma of skin 03/04/2017   in situ-left outer sup, shin (txpbx), in situ- right 2nd knuckle finger (txpbx), in situ- Left wrist (txpbx)  . Squamous cell carcinoma of skin 04/06/2018   in situ-above left knee inner (txpbx)  . Squamous cell carcinoma of skin 04/21/2018   in situ-right top hand (txpbx)  . Squamous cell carcinoma of skin 07/08/2018   in situ-right lower inner shin (txpbx)  . Squamous cell carcinoma of skin 04/27/2019   in situ- Left neck(CX35FU), In situ- right neck (CX35FU)  . Urinary retention    sees Dr.Wrenn about 2 times a yr    Family History  Problem Relation Age of Onset  . Cancer Mother        pancreatic  . Diabetes Mother   . Heart disease Father        Rheumatic  . Cancer Father        ? stomach  . Cancer Sister        stomach  . Cancer Maternal Aunt        X 4; ? primary  . Heart disease Paternal Aunt   . Cancer Maternal Grandmother        cervical  . Colon cancer Other        Aunts  . Multiple sclerosis Daughter   . Hypertension Neg Hx   . Stroke Neg Hx     Past Surgical History:  Procedure Laterality Date  . ABDOMINAL HYSTERECTOMY     1985  . APPENDECTOMY     with TAH  . BACK SURGERY     several  . BREAST BIOPSY Right    Results were negative  . BROW LIFT Bilateral 10/02/2016   Procedure: BILATERAL LOWER LID BLEPHAROPLASTY;  Surgeon: Clista Bernhardt, MD;  Location: [facility];  Service: Plastics;  Laterality: Bilateral;  . CARPAL TUNNEL RELEASE Right   . cataracts    . CERVICAL FUSION     Dr Joya Salm  . CERVICAL FUSION  12/31/2012   Dr Joya Salm  . CHEST TUBE INSERTION     for traumatic Pneumothorax  . COLONOSCOPY  07/16/2010   normal   . COLONOSCOPY W/ POLYPECTOMY  1997   negative  since; Dr Deatra Ina  . ESOPHAGOGASTRODUODENOSCOPY  07/16/2010   normal  . eye lid raise    . EYE SURGERY Bilateral    cataracts removed - /w IOL  . JOINT REPLACEMENT    . LUMBAR FUSION     Dr Joya Salm  . NASAL SINUS SURGERY    . POSTERIOR  CERVICAL FUSION/FORAMINOTOMY N/A 12/31/2012   Procedure: POSTERIOR LATERAL CERVICAL FUSION/FORAMINOTOMY LEVEL 1 CERVICAL THREE-FOUR WITH LATERAL MASS SCREWS;  Surgeon: Floyce Stakes, MD;  Location: [facility];  Service: Neurosurgery;  Laterality: N/A;  . PTOSIS REPAIR Bilateral 10/02/2016   Procedure: INTERNAL PTOSIS REPAIR;  Surgeon: Clista Bernhardt, MD;  Location: [facility];  Service: Plastics;  Laterality: Bilateral;  . SKIN BIOPSY    . THYROID SURGERY     R lobe removed- 1972, has grown back - CT last done- 2018  . TOTAL HIP ARTHROPLASTY  12/22/2011   Procedure: TOTAL HIP ARTHROPLASTY;  Surgeon: Kerin Salen, MD;  Location: [facility];  Service: Orthopedics;  Laterality: Left;  . TOTAL SHOULDER ARTHROPLASTY    . TUBAL LIGATION     Social History   Occupational History  . Occupation: retired  Tobacco Use  . Smoking status: Former Smoker    Packs/day: 2.50    Years: 20.00    Pack years: 50.00    Types: Cigarettes    Quit date: 05/26/1981    Years since quitting: 39.2  . Smokeless tobacco: Never Used  . Tobacco comment: Smoked [id number], up to 2.5 ppd  Vaping Use  . Vaping Use: Never used  Substance and Sexual Activity  . Alcohol use: Yes    Alcohol/week: 14.0 standard drinks    Types: 14 Cans of beer per week    Comment: 2% beer  . Drug use: Never  . Sexual activity: Yes

## 2020-08-28 NOTE — Progress Notes (Signed)
I saw and examined the patient with Dr. Elouise Munroe and agree with assessment and plan as outlined.    Healing left foot proximal 5th MT fracture.  New thoracic pain, with tenderness over T7-8 area and right-sided paraspinous muscles.  Question compression fractures.    Return in 2 weeks for repeat T-spine x-ray if not improving.

## 2020-09-04 ENCOUNTER — Encounter: Payer: Self-pay | Admitting: Physician Assistant

## 2020-09-04 ENCOUNTER — Other Ambulatory Visit: Payer: Self-pay

## 2020-09-04 ENCOUNTER — Ambulatory Visit: Payer: Medicare Other | Admitting: Physician Assistant

## 2020-09-04 VITALS — BP 116/65 | HR 69 | Resp 14 | Ht 63.5 in | Wt 118.0 lb

## 2020-09-04 DIAGNOSIS — Z8639 Personal history of other endocrine, nutritional and metabolic disease: Secondary | ICD-10-CM

## 2020-09-04 DIAGNOSIS — M5136 Other intervertebral disc degeneration, lumbar region: Secondary | ICD-10-CM

## 2020-09-04 DIAGNOSIS — M4312 Spondylolisthesis, cervical region: Secondary | ICD-10-CM

## 2020-09-04 DIAGNOSIS — M810 Age-related osteoporosis without current pathological fracture: Secondary | ICD-10-CM

## 2020-09-04 DIAGNOSIS — Z8659 Personal history of other mental and behavioral disorders: Secondary | ICD-10-CM

## 2020-09-04 DIAGNOSIS — S62101D Fracture of unspecified carpal bone, right wrist, subsequent encounter for fracture with routine healing: Secondary | ICD-10-CM

## 2020-09-04 DIAGNOSIS — F5101 Primary insomnia: Secondary | ICD-10-CM | POA: Diagnosis not present

## 2020-09-04 DIAGNOSIS — M1611 Unilateral primary osteoarthritis, right hip: Secondary | ICD-10-CM

## 2020-09-04 DIAGNOSIS — M0579 Rheumatoid arthritis with rheumatoid factor of multiple sites without organ or systems involvement: Secondary | ICD-10-CM | POA: Diagnosis not present

## 2020-09-04 DIAGNOSIS — R5383 Other fatigue: Secondary | ICD-10-CM | POA: Diagnosis not present

## 2020-09-04 DIAGNOSIS — Z96642 Presence of left artificial hip joint: Secondary | ICD-10-CM

## 2020-09-04 DIAGNOSIS — E559 Vitamin D deficiency, unspecified: Secondary | ICD-10-CM

## 2020-09-04 DIAGNOSIS — G35 Multiple sclerosis: Secondary | ICD-10-CM

## 2020-09-04 DIAGNOSIS — Z8679 Personal history of other diseases of the circulatory system: Secondary | ICD-10-CM

## 2020-09-04 DIAGNOSIS — Z79899 Other long term (current) drug therapy: Secondary | ICD-10-CM

## 2020-09-04 DIAGNOSIS — Z111 Encounter for screening for respiratory tuberculosis: Secondary | ICD-10-CM

## 2020-09-04 MED ORDER — "TUBERCULIN SYRINGE 27G X 1/2"" 1 ML MISC": 3 refills | Status: DC

## 2020-09-04 MED ORDER — METHOTREXATE SODIUM CHEMO INJECTION 50 MG/2ML: INTRAMUSCULAR | 0 refills | Status: DC

## 2020-09-04 NOTE — Patient Instructions (Signed)
Standing Labs We placed an order today for your standing lab work.   Please have your standing labs drawn in May and every 3 months   If possible, please have your labs drawn 2 weeks prior to your appointment so that the provider can discuss your results at your appointment.  We have open lab daily Monday through Thursday from 1:30-4:30 PM and Friday from 1:30-4:00 PM at the office of Dr. Bo Merino, [facility].   Please be advised, all patients with office appointments requiring lab work will take precedents over walk-in lab work.  If possible, please come for your lab work on Monday and Friday afternoons, as you may experience shorter wait times. The office is located at [address] [place], [place], [place] [address] No appointment is necessary.   Labs are drawn by [facility]. Please bring your co-pay at the time of your lab draw.  You may receive a bill from [facility] for your lab work.  If you wish to have your labs drawn at another location, please call the office 24 hours in advance to send orders.  If you have any questions regarding directions or hours of operation,  please call [phone].   As a reminder, please drink plenty of water prior to coming for your lab work. Thanks!

## 2020-09-04 NOTE — Progress Notes (Signed)
Medication Samples have been provided to the patient.  Drug name: [name]      Strength: 15mg          Qty: 2  LOT: [id number]  Exp.Date: 01/20/2022  Dosing instructions: Take 1 tablet by mouth daily.

## 2020-09-05 DIAGNOSIS — N3010 Interstitial cystitis (chronic) without hematuria: Secondary | ICD-10-CM | POA: Diagnosis not present

## 2020-09-05 DIAGNOSIS — R8271 Bacteriuria: Secondary | ICD-10-CM | POA: Diagnosis not present

## 2020-09-10 ENCOUNTER — Ambulatory Visit (AMBULATORY_SURGERY_CENTER): Payer: Self-pay

## 2020-09-10 ENCOUNTER — Other Ambulatory Visit: Payer: Self-pay

## 2020-09-10 VITALS — Ht 61.0 in | Wt 117.0 lb

## 2020-09-10 DIAGNOSIS — K5900 Constipation, unspecified: Secondary | ICD-10-CM

## 2020-09-10 DIAGNOSIS — Z800 Family history of malignant neoplasm of digestive organs: Secondary | ICD-10-CM

## 2020-09-10 DIAGNOSIS — K219 Gastro-esophageal reflux disease without esophagitis: Secondary | ICD-10-CM

## 2020-09-10 DIAGNOSIS — R194 Change in bowel habit: Secondary | ICD-10-CM

## 2020-09-10 MED ORDER — NA SULFATE-K SULFATE-MG SULF 17.5-3.13-1.6 GM/177ML PO SOLN: 1.0000 | Freq: Once | ORAL | 0 refills | Status: AC

## 2020-09-10 NOTE — Progress Notes (Signed)
No egg or soy allergy known to patient  No issues with past sedation with any surgeries or procedures Patient denies ever being told they had issues or difficulty with intubation  No FH of Malignant Hyperthermia No diet pills per patient No home 02 use per patient  No blood thinners per patient  Pt HAS issues with constipation  No A fib or A flutter  EMMI video to pt or via [place] 19 guidelines implemented in PV today with Pt and RN  Pt is fully vaccinated  for Covid   2 DAY PREP, HX OF CONSTIPATION    NO PA's for preps discussed with pt In PV today  Discussed with pt there will be an out-of-pocket cost for prep and that varies from $0 to 70 dollars   Due to the COVID-19 pandemic we are asking patients to follow certain guidelines.  Pt aware of COVID protocols and [facility] guidelines

## 2020-09-13 ENCOUNTER — Encounter: Payer: Self-pay | Admitting: Gastroenterology

## 2020-09-17 ENCOUNTER — Other Ambulatory Visit: Payer: Self-pay

## 2020-09-17 ENCOUNTER — Encounter: Payer: Self-pay | Admitting: Gastroenterology

## 2020-09-17 ENCOUNTER — Ambulatory Visit (AMBULATORY_SURGERY_CENTER): Payer: Medicare Other | Admitting: Gastroenterology

## 2020-09-17 VITALS — BP 101/64 | HR 66 | Temp 98.6°F | Resp 10 | Ht 61.0 in | Wt 117.0 lb

## 2020-09-17 DIAGNOSIS — K5730 Diverticulosis of large intestine without perforation or abscess without bleeding: Secondary | ICD-10-CM | POA: Diagnosis not present

## 2020-09-17 DIAGNOSIS — K319 Disease of stomach and duodenum, unspecified: Secondary | ICD-10-CM

## 2020-09-17 DIAGNOSIS — B3781 Candidal esophagitis: Secondary | ICD-10-CM

## 2020-09-17 DIAGNOSIS — Z86010 Personal history of colonic polyps: Secondary | ICD-10-CM

## 2020-09-17 DIAGNOSIS — K449 Diaphragmatic hernia without obstruction or gangrene: Secondary | ICD-10-CM | POA: Diagnosis not present

## 2020-09-17 DIAGNOSIS — D120 Benign neoplasm of cecum: Secondary | ICD-10-CM

## 2020-09-17 DIAGNOSIS — K219 Gastro-esophageal reflux disease without esophagitis: Secondary | ICD-10-CM

## 2020-09-17 DIAGNOSIS — R194 Change in bowel habit: Secondary | ICD-10-CM

## 2020-09-17 DIAGNOSIS — D123 Benign neoplasm of transverse colon: Secondary | ICD-10-CM

## 2020-09-17 DIAGNOSIS — Z800 Family history of malignant neoplasm of digestive organs: Secondary | ICD-10-CM

## 2020-09-17 DIAGNOSIS — G35 Multiple sclerosis: Secondary | ICD-10-CM | POA: Diagnosis not present

## 2020-09-17 DIAGNOSIS — K2970 Gastritis, unspecified, without bleeding: Secondary | ICD-10-CM

## 2020-09-17 DIAGNOSIS — K2100 Gastro-esophageal reflux disease with esophagitis, without bleeding: Secondary | ICD-10-CM

## 2020-09-17 DIAGNOSIS — G2581 Restless legs syndrome: Secondary | ICD-10-CM | POA: Diagnosis not present

## 2020-09-17 DIAGNOSIS — K2900 Acute gastritis without bleeding: Secondary | ICD-10-CM

## 2020-09-17 MED ORDER — SODIUM CHLORIDE 0.9 % IV SOLN: 500.0000 mL | Freq: Once | INTRAVENOUS | Status: DC

## 2020-09-17 MED ORDER — FAMOTIDINE 40 MG PO TABS: 40.0000 mg | ORAL_TABLET | Freq: Two times a day (BID) | ORAL | 11 refills | Status: DC

## 2020-09-17 NOTE — Patient Instructions (Signed)
YOU HAD AN ENDOSCOPIC PROCEDURE TODAY AT THE [facility]:   Refer to the procedure report that was given to you for any specific questions about what was found during the examination.  If the procedure report does not answer your questions, please call your gastroenterologist to clarify.  If you requested that your care partner not be given the details of your procedure findings, then the procedure report has been included in a sealed envelope for you to review at your convenience later.  YOU SHOULD EXPECT: Some feelings of bloating in the abdomen. Passage of more gas than usual.  Walking can help get rid of the air that was put into your GI tract during the procedure and reduce the bloating. If you had a lower endoscopy (such as a colonoscopy or flexible sigmoidoscopy) you may notice spotting of blood in your stool or on the toilet paper. If you underwent a bowel prep for your procedure, you may not have a normal bowel movement for a few days.  Please Note:  You might notice some irritation and congestion in your nose or some drainage.  This is from the oxygen used during your procedure.  There is no need for concern and it should clear up in a day or so.  SYMPTOMS TO REPORT IMMEDIATELY:   Following lower endoscopy (colonoscopy or flexible sigmoidoscopy):  Excessive amounts of blood in the stool  Significant tenderness or worsening of abdominal pains  Swelling of the abdomen that is new, acute  Fever of 100F or higher   Following upper endoscopy (EGD)  Vomiting of blood or coffee ground material  New chest pain or pain under the shoulder blades  Painful or persistently difficult swallowing  New shortness of breath  Fever of 100F or higher  Black, tarry-looking stools  For urgent or emergent issues, a gastroenterologist can be reached at any hour by calling [phone]. Do not use MyChart messaging for urgent concerns.    DIET:  We do recommend a small meal at first, but  then you may proceed to your regular diet.  Drink plenty of fluids but you should avoid alcoholic beverages for 24 hours.  ACTIVITY:  You should plan to take it easy for the rest of today and you should NOT DRIVE or use heavy machinery until tomorrow (because of the sedation medicines used during the test).    FOLLOW UP: Our staff will call the number listed on your records 48-72 hours following your procedure to check on you and address any questions or concerns that you may have regarding the information given to you following your procedure. If we do not reach you, we will leave a message.  We will attempt to reach you two times.  During this call, we will ask if you have developed any symptoms of COVID 19. If you develop any symptoms (ie: fever, flu-like symptoms, shortness of breath, cough etc.) before then, please call [phone].  If you test positive for Covid 19 in the 2 weeks post procedure, please call and report this information to us.    If any biopsies were taken you will be contacted by phone or by letter within the next 1-3 weeks.  Please call us at [phone] if you have not heard about the biopsies in 3 weeks.    SIGNATURES/CONFIDENTIALITY: You and/or your care partner have signed paperwork which will be entered into your electronic medical record.  These signatures attest to the fact that that the information above on   your After Visit Summary has been reviewed and is understood.  Full responsibility of the confidentiality of this discharge information lies with you and/or your care-partner. 

## 2020-09-17 NOTE — Progress Notes (Signed)
Pt's states no medical or surgical changes since previsit or office visit. 

## 2020-09-17 NOTE — Progress Notes (Signed)
Called to room to assist during endoscopic procedure.  Patient ID and intended procedure confirmed with present staff. Received instructions for my participation in the procedure from the performing physician.  

## 2020-09-17 NOTE — Op Note (Signed)
[facility] Patient Name: [place] Procedure Date: 09/17/2020 8:32 AM MRN: [id number] Endoscopist: Ladene Artist , MD Age: 80 Referring MD:  Date of Birth: 06/16/1940 Gender: Female Account #: [id number] Procedure:                Colonoscopy Indications:              Change in bowel habits Medicines:                Monitored Anesthesia Care Procedure:                Pre-Anesthesia Assessment:                           - Prior to the procedure, a History and Physical                            was performed, and patient medications and                            allergies were reviewed. The patient's tolerance of                            previous anesthesia was also reviewed. The risks                            and benefits of the procedure and the sedation                            options and risks were discussed with the patient.                            All questions were answered, and informed consent                            was obtained. Prior Anticoagulants: The patient has                            taken no previous anticoagulant or antiplatelet                            agents. ASA Grade Assessment: II - A patient with                            mild systemic disease. After reviewing the risks                            and benefits, the patient was deemed in                            satisfactory condition to undergo the procedure.                           After obtaining informed consent, the colonoscope  was passed under direct vision. Throughout the                            procedure, the patient's blood pressure, pulse, and                            oxygen saturations were monitored continuously. The                            Olympus PCF-H190DL (#[id number]) Colonoscope was                            introduced through the anus and advanced to the the                            cecum, identified by appendiceal  orifice and                            ileocecal valve. The ileocecal valve, appendiceal                            orifice, and rectum were photographed. The quality                            of the bowel preparation was good. The colonoscopy                            was performed without difficulty. The patient                            tolerated the procedure well. Scope In: 8:35:19 AM Scope Out: 8:58:16 AM Scope Withdrawal Time: 0 hours 20 minutes 24 seconds  Total Procedure Duration: 0 hours 22 minutes 57 seconds  Findings:                 The perianal and digital rectal examinations were                            normal.                           A 3 mm polyp was found in the cecum. The polyp was                            sessile. The polyp was removed with a cold biopsy                            forceps. Resection and retrieval were complete.                           Three sessile polyps were found in the transverse                            colon. The polyps were 5 to 8 mm in size.  These                            polyps were removed with a cold snare. Resection                            and retrieval were complete.                           A few small-mouthed diverticula were found in the                            left colon.                           The exam was otherwise without abnormality on                            direct and retroflexion views. Complications:            No immediate complications. Estimated blood loss:                            None. Estimated Blood Loss:     Estimated blood loss: none. Impression:               - One 3 mm polyp in the cecum, removed with a cold                            biopsy forceps. Resected and retrieved.                           - Three 5 to 8 mm polyps in the transverse colon,                            removed with a cold snare. Resected and retrieved.                           - Mild diverticulosis in the left  colon.                           - The examination was otherwise normal on direct                            and retroflexion views. Recommendation:           - Repeat colonoscopy after studies are complete for                            surveillance based on pathology results vs no                            repeat colonoscopy due to age.                           - Patient has a contact number available for  emergencies. The signs and symptoms of potential                            delayed complications were discussed with the                            patient. Return to normal activities tomorrow.                            Written discharge instructions were provided to the                            patient.                           - High fiber diet.                           - Continue present medications.                           - Await pathology results. Ladene Artist, MD 09/17/2020 9:10:11 AM This report has been signed electronically.

## 2020-09-17 NOTE — Progress Notes (Signed)
Report to PACU, RN, vss, BBS= Clear.  

## 2020-09-17 NOTE — Op Note (Signed)
[facility] Patient Name: Sherri [name] Procedure Date: 09/17/2020 8:31 AM MRN: [id number] Endoscopist: Ladene Artist , MD Age: 80 Referring MD:  Date of Birth: [birth year] Gender: Female Account #: [id number] Procedure:                Upper GI endoscopy Indications:              Gastroesophageal reflux disease Medicines:                Monitored Anesthesia Care Procedure:                Pre-Anesthesia Assessment:                           - Prior to the procedure, a History and Physical                            was performed, and patient medications and                            allergies were reviewed. The patient's tolerance of                            previous anesthesia was also reviewed. The risks                            and benefits of the procedure and the sedation                            options and risks were discussed with the patient.                            All questions were answered, and informed consent                            was obtained. Prior Anticoagulants: The patient has                            taken no previous anticoagulant or antiplatelet                            agents. ASA Grade Assessment: II - A patient with                            mild systemic disease. After reviewing the risks                            and benefits, the patient was deemed in                            satisfactory condition to undergo the procedure.                           After obtaining informed consent, the endoscope was  passed under direct vision. Throughout the                            procedure, the patient's blood pressure, pulse, and                            oxygen saturations were monitored continuously. The                            Endoscope was introduced through the mouth, and                            advanced to the second part of duodenum. The upper                            GI endoscopy was  accomplished without difficulty.                            The patient tolerated the procedure well. Scope In: Scope Out: Findings:                 LA Grade A (one or more mucosal breaks less than 5                            mm, not extending between tops of 2 mucosal folds)                            esophagitis with no bleeding was found in the                            distal esophagus.                           Multiple small white exudates were found in the mid                            esophagus and in the distal esophagus. Biopsies                            were taken with a cold forceps for histology.                           The exam of the esophagus was otherwise normal.                           Localized mildly erythematous mucosa without                            bleeding was found in the prepyloric region of the                            stomach. Biopsies were taken with a cold forceps  for histology.                           A small hiatal hernia was present.                           The exam of the stomach was otherwise normal.                           The duodenal bulb and second portion of the                            duodenum were normal. Complications:            No immediate complications. Estimated Blood Loss:     Estimated blood loss was minimal. Impression:               - LA Grade A reflux esophagitis with no bleeding.                           - Multiple small white exudates in the mid                            esophagus and in the distal esophagus. Biopsied.                           - Erythematous mucosa in the prepyloric region of                            the stomach. Biopsied.                           - Small hiatal hernia.                           - Normal duodenal bulb and second portion of the                            duodenum. Recommendation:           - Patient has a contact number available for                             emergencies. The signs and symptoms of potential                            delayed complications were discussed with the                            patient. Return to normal activities tomorrow.                            Written discharge instructions were provided to the                            patient.                           -  Resume previous diet.                           - Follow antireflux measures long term.                           - Continue present medications.                           - Increase famotidine to 40 mg po bid, 1 year of                            refills.                           - Await pathology results. Ladene Artist, MD 09/17/2020 9:16:44 AM This report has been signed electronically.

## 2020-09-18 ENCOUNTER — Telehealth: Payer: Self-pay | Admitting: Family Medicine

## 2020-09-18 ENCOUNTER — Other Ambulatory Visit: Payer: Self-pay | Admitting: Neurology

## 2020-09-18 MED ORDER — DIAZEPAM 5 MG PO TABS: 5.0000 mg | ORAL_TABLET | Freq: Every day | ORAL | 5 refills | Status: DC

## 2020-09-18 NOTE — Telephone Encounter (Signed)
Pt is needing a refill on her traMADol (ULTRAM) 50 MG tablet and her diazepam (VALIUM) 5 MG tablet sent in to the [facility] on [address]

## 2020-09-18 NOTE — Telephone Encounter (Signed)
Reviewed pt chart. She was last seen 02/02/20 and next follow up 11/20/20.  Last rx tramadol sent 08/22/20 #60, 2 refills. There should be refills at the pharmacy for this. Checked drug registry. She last refilled diazepam 05/23/20 #30.

## 2020-09-18 NOTE — Telephone Encounter (Signed)
error 

## 2020-09-19 ENCOUNTER — Other Ambulatory Visit (HOSPITAL_COMMUNITY): Payer: Self-pay

## 2020-09-19 ENCOUNTER — Telehealth: Payer: Self-pay | Admitting: Internal Medicine

## 2020-09-19 ENCOUNTER — Telehealth: Payer: Self-pay | Admitting: *Deleted

## 2020-09-19 MED ORDER — TRAMADOL HCL 50 MG PO TABS: 50.0000 mg | ORAL_TABLET | Freq: Two times a day (BID) | ORAL | 2 refills | Status: DC

## 2020-09-19 NOTE — Telephone Encounter (Signed)
Yes, I will renew-sent to [facility]

## 2020-09-19 NOTE — Telephone Encounter (Signed)
Patient called and was wondering if Dr. Quay Burow could fill traMADol (ULTRAM) 50 MG tablet. She said that Dr. Garth Bigness PA told her she would need an appt before a refill and she cant see him until the end of June. [facility] can be sent to [id number] - [place], [place]. Please advise

## 2020-09-19 NOTE — Telephone Encounter (Signed)
Already sent.

## 2020-09-19 NOTE — Telephone Encounter (Signed)
  Follow up Call-  Call back number 09/17/2020  Post procedure Call Back phone  # [phone]  Permission to leave phone message Yes  Some recent data might be hidden     Patient questions:  Do you have a fever, pain , or abdominal swelling? No. Pain Score  0 *  Have you tolerated food without any problems? Yes.    Have you been able to return to your normal activities? Yes.    Do you have any questions about your discharge instructions: Diet   No. Medications  No. Follow up visit  No.  Do you have questions or concerns about your Care? No.  Actions: * If pain score is 4 or above: No action needed, pain <4.  1. Have you developed a fever since your procedure? no  2.   Have you had an respiratory symptoms (SOB or cough) since your procedure? no  3.   Have you tested positive for COVID 19 since your procedure no  4.   Have you had any family members/close contacts diagnosed with the COVID 19 since your procedure?  no   If yes to any of these questions please route to Joylene John, RN and Joella Prince, RN

## 2020-09-22 ENCOUNTER — Other Ambulatory Visit: Payer: Self-pay | Admitting: Internal Medicine

## 2020-09-26 ENCOUNTER — Other Ambulatory Visit: Payer: Self-pay | Admitting: *Deleted

## 2020-09-26 DIAGNOSIS — M0579 Rheumatoid arthritis with rheumatoid factor of multiple sites without organ or systems involvement: Secondary | ICD-10-CM

## 2020-09-26 DIAGNOSIS — Z79899 Other long term (current) drug therapy: Secondary | ICD-10-CM

## 2020-09-26 DIAGNOSIS — Z111 Encounter for screening for respiratory tuberculosis: Secondary | ICD-10-CM

## 2020-09-27 ENCOUNTER — Ambulatory Visit: Payer: Medicare Other | Admitting: Physician Assistant

## 2020-09-27 ENCOUNTER — Other Ambulatory Visit: Payer: Self-pay

## 2020-09-27 ENCOUNTER — Encounter: Payer: Self-pay | Admitting: Physician Assistant

## 2020-09-27 DIAGNOSIS — L578 Other skin changes due to chronic exposure to nonionizing radiation: Secondary | ICD-10-CM | POA: Diagnosis not present

## 2020-09-27 DIAGNOSIS — L821 Other seborrheic keratosis: Secondary | ICD-10-CM | POA: Diagnosis not present

## 2020-09-27 DIAGNOSIS — D485 Neoplasm of uncertain behavior of skin: Secondary | ICD-10-CM

## 2020-09-27 DIAGNOSIS — Z85828 Personal history of other malignant neoplasm of skin: Secondary | ICD-10-CM

## 2020-09-27 DIAGNOSIS — D1800 Hemangioma unspecified site: Secondary | ICD-10-CM

## 2020-09-27 DIAGNOSIS — Z1283 Encounter for screening for malignant neoplasm of skin: Secondary | ICD-10-CM

## 2020-09-27 DIAGNOSIS — L570 Actinic keratosis: Secondary | ICD-10-CM

## 2020-09-27 DIAGNOSIS — L814 Other melanin hyperpigmentation: Secondary | ICD-10-CM

## 2020-09-27 DIAGNOSIS — D229 Melanocytic nevi, unspecified: Secondary | ICD-10-CM | POA: Diagnosis not present

## 2020-09-27 NOTE — Patient Instructions (Signed)

## 2020-09-28 ENCOUNTER — Telehealth: Payer: Self-pay

## 2020-09-28 DIAGNOSIS — M546 Pain in thoracic spine: Secondary | ICD-10-CM

## 2020-09-28 LAB — COMPLETE METABOLIC PANEL WITH GFR
AG Ratio: 1.7 (calc) (ref 1.0–2.5)
ALT: 19 U/L (ref 6–29)
AST: 27 U/L (ref 10–35)
Albumin: 4 g/dL (ref 3.6–5.1)
Alkaline phosphatase (APISO): 27 U/L — ABNORMAL LOW (ref 37–153)
BUN/Creatinine Ratio: 10 (calc) (ref 6–22)
BUN: 10 mg/dL (ref 7–25)
CO2: 27 mmol/L (ref 20–32)
Calcium: 9 mg/dL (ref 8.6–10.4)
Chloride: 104 mmol/L (ref 98–110)
Creat: 0.97 mg/dL — ABNORMAL HIGH (ref 0.60–0.93)
GFR, Est African American: 65 mL/min/{1.73_m2} (ref >=60)
GFR, Est Non African American: 56 mL/min/{1.73_m2} — ABNORMAL LOW (ref >=60)
Globulin: 2.4 g/dL (calc) (ref 1.9–3.7)
Glucose, Bld: 74 mg/dL (ref 65–99)
Potassium: 4.7 mmol/L (ref 3.5–5.3)
Sodium: 139 mmol/L (ref 135–146)
Total Bilirubin: 0.6 mg/dL (ref 0.2–1.2)
Total Protein: 6.4 g/dL (ref 6.1–8.1)

## 2020-09-28 LAB — CBC WITH DIFFERENTIAL/PLATELET
Absolute Monocytes: 469 cells/uL (ref 200–950)
Basophils Absolute: 10 cells/uL (ref 0–200)
Basophils Relative: 0.3 %
Eosinophils Absolute: 20 cells/uL (ref 15–500)
Eosinophils Relative: 0.6 %
HCT: 35.3 % (ref 35.0–45.0)
Hemoglobin: 11.9 g/dL (ref 11.7–15.5)
Lymphs Abs: 1136 cells/uL (ref 850–3900)
MCH: 35.3 pg — ABNORMAL HIGH (ref 27.0–33.0)
MCHC: 33.7 g/dL (ref 32.0–36.0)
MCV: 104.7 fL — ABNORMAL HIGH (ref 80.0–100.0)
MPV: 9.3 fL (ref 7.5–12.5)
Monocytes Relative: 13.8 %
Neutro Abs: 1765 cells/uL (ref 1500–7800)
Neutrophils Relative %: 51.9 %
Platelets: 274 10*3/uL (ref 140–400)
RBC: 3.37 10*6/uL — ABNORMAL LOW (ref 3.80–5.10)
RDW: 13.3 % (ref 11.0–15.0)
Total Lymphocyte: 33.4 %
WBC: 3.4 10*3/uL — ABNORMAL LOW (ref 3.8–10.8)

## 2020-09-28 LAB — QUANTIFERON-TB GOLD PLUS
Mitogen-NIL: 7.01 IU/mL
NIL: 0.05 IU/mL
QuantiFERON-TB Gold Plus: NEGATIVE
TB1-NIL: 0 IU/mL
TB2-NIL: <0 IU/mL

## 2020-09-28 NOTE — Telephone Encounter (Signed)
Ok to order MRI? 

## 2020-09-28 NOTE — Progress Notes (Signed)
Creatinine is mildly elevated, most likely due to diuretic use.  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  Rest the labs are stable.

## 2020-09-28 NOTE — Telephone Encounter (Signed)
Pt called into the office stating that her back pain has progressively gotten worse.   she said Dr. Junius Roads said if it didn't get any better that we would sch an MRI.   Please Advise

## 2020-10-01 NOTE — Telephone Encounter (Signed)
Am I ordering a MRI thoracic spine with out contrast?

## 2020-10-01 NOTE — Addendum Note (Signed)
Addended by: Robyne Peers on: 10/01/2020 12:35 PM   Modules accepted: Orders

## 2020-10-01 NOTE — Progress Notes (Signed)
TB gold negative

## 2020-10-01 NOTE — Telephone Encounter (Signed)
Called pt and informed her. She stated understanding.

## 2020-10-01 NOTE — Telephone Encounter (Signed)
MRI ordered

## 2020-10-02 ENCOUNTER — Other Ambulatory Visit: Payer: Self-pay

## 2020-10-02 ENCOUNTER — Ambulatory Visit (INDEPENDENT_AMBULATORY_CARE_PROVIDER_SITE_OTHER): Payer: Medicare Other | Admitting: Pharmacist

## 2020-10-02 DIAGNOSIS — I10 Essential (primary) hypertension: Secondary | ICD-10-CM

## 2020-10-02 DIAGNOSIS — K219 Gastro-esophageal reflux disease without esophagitis: Secondary | ICD-10-CM

## 2020-10-02 DIAGNOSIS — M0579 Rheumatoid arthritis with rheumatoid factor of multiple sites without organ or systems involvement: Secondary | ICD-10-CM

## 2020-10-02 DIAGNOSIS — M810 Age-related osteoporosis without current pathological fracture: Secondary | ICD-10-CM | POA: Diagnosis not present

## 2020-10-02 DIAGNOSIS — E7849 Other hyperlipidemia: Secondary | ICD-10-CM | POA: Diagnosis not present

## 2020-10-02 NOTE — Progress Notes (Signed)
Chronic Care Management Pharmacy Note  10/07/2020 Name:  Sherri [name] MRN:  [id number] DOB:  14-Nov-1940  Subjective: Sherri [name] is an 80 y.o. year old female who is a primary patient of Burns, Claudina Lick, MD.  The CCM team was consulted for assistance with disease management and care coordination needs.    Engaged with patient by telephone for follow up visit in response to provider referral for pharmacy case management and/or care coordination services.   Consent to Services:  The patient was given information about Chronic Care Management services, agreed to services, and gave verbal consent prior to initiation of services.  Please see initial visit note for detailed documentation.   Patient Care Team: Binnie Rail, MD as PCP - General (Internal Medicine) Sater, Nanine Means, MD (Neurology) Bo Merino, MD as Consulting Physician (Rheumatology) Charlton Haws, [facility] as Pharmacist (Pharmacist) Starlyn Skeans as Physician Assistant (Dermatology)  Recent office visits: 08/10/20 Dr Quay Burow OV: CPE. No med changes.  Recent consult visits: 09/27/20 PA Sheffield (dermatology): f/u actinic keratosis 09/17/20 endoscopy 09/04/20 PA Quita Skye (rheumatology): switched methotrexate to injection for higher efficacy. Continue daily folic acid.  Hospital visits: None in previous 6 months  Objective:  Lab Results  Component Value Date   CREATININE 0.97 (H) 09/26/2020   BUN 10 09/26/2020   GFR 49.33 (L) 04/10/2020   GFRNONAA 56 (L) 09/26/2020   GFRAA 65 09/26/2020   NA 139 09/26/2020   K 4.7 09/26/2020   CALCIUM 9.0 09/26/2020   CO2 27 09/26/2020   GLUCOSE 74 09/26/2020    Lab Results  Component Value Date/Time   HGBA1C 5.6 10/24/2013 09:44 AM   HGBA1C 5.2 11/11/2011 08:15 AM   GFR 49.33 (L) 04/10/2020 09:14 AM   GFR 69.54 03/30/2019 12:46 PM    Last diabetic Eye exam: No results found for: HMDIABEYEEXA  Last diabetic Foot exam: No results found for:  HMDIABFOOTEX   Lab Results  Component Value Date   CHOL 196 08/23/2020   HDL 84.00 08/23/2020   LDLCALC 92 08/23/2020   LDLDIRECT 122.9 11/15/2012   TRIG 100.0 08/23/2020   CHOLHDL 2 08/23/2020    Hepatic Function Latest Ref Rng & Units 09/26/2020 06/27/2020 04/10/2020  Total Protein 6.1 - 8.1 g/dL 6.4 6.5 7.0  Albumin 3.5 - 5.2 g/dL - - 4.2  AST 10 - 35 U/L 27 28 29   ALT 6 - 29 U/L 19 25 18   Alk Phosphatase 39 - 117 U/L - - 29(L)  Total Bilirubin 0.2 - 1.2 mg/dL 0.6 0.6 0.7  Bilirubin, Direct 0.0 - 0.3 mg/dL - - -    Lab Results  Component Value Date/Time   TSH 1.37 04/10/2020 09:14 AM   TSH 1.11 10/26/2017 03:27 PM   FREET4 1.42 10/26/2017 03:27 PM   FREET4 1.02 07/22/2016 05:26 PM    CBC Latest Ref Rng & Units 09/26/2020 06/27/2020 04/10/2020  WBC 3.8 - 10.8 Thousand/uL 3.4(L) 3.9 6.8  Hemoglobin 11.7 - 15.5 g/dL 11.9 12.4 12.7  Hematocrit 35.0 - 45.0 % 35.3 35.4 36.5  Platelets 140 - 400 Thousand/uL 274 285 266.0    Lab Results  Component Value Date/Time   VD25OH 89 06/27/2020 12:00 AM   VD25OH 57 08/31/2017 01:27 PM    Clinical ASCVD: No  The 10-year ASCVD risk score Mikey Bussing DC Jr., et al., 2013) is: 19.7%   Values used to calculate the score:     Age: 38 years     Sex: Female  Is Non-Hispanic African American: No     Diabetic: No     Tobacco smoker: No     Systolic Blood Pressure: 112 mmHg     Is BP treated: Yes     HDL Cholesterol: 84 mg/dL     Total Cholesterol: 196 mg/dL    Depression screen [name] 2/9 08/10/2020 08/09/2020 10/26/2019  Decreased Interest 0 0 0  Down, Depressed, Hopeless 0 0 0  PHQ - 2 Score 0 0 0  Altered sleeping 1 - -  Tired, decreased energy 0 - -  Change in appetite 0 - -  Feeling bad or failure about yourself  0 - -  Trouble concentrating 0 - -  Moving slowly or fidgety/restless 0 - -  Suicidal thoughts 0 - -  PHQ-9 Score 1 - -  Difficult doing work/chores Not difficult at all - -  Some recent data might be hidden     Social  History   Tobacco Use  Smoking Status Former Smoker  . Packs/day: 2.50  . Years: 20.00  . Pack years: 50.00  . Types: Cigarettes  . Quit date: 05/26/1981  . Years since quitting: 39.3  Smokeless Tobacco Never Used  Tobacco Comment   Smoked [phone], up to 2.5 ppd   BP Readings from Last 3 Encounters:  09/17/20 101/64  09/04/20 116/65  08/10/20 110/78   Pulse Readings from Last 3 Encounters:  09/17/20 66  09/04/20 69  08/10/20 68   Wt Readings from Last 3 Encounters:  09/17/20 117 lb (53.1 kg)  09/10/20 117 lb (53.1 kg)  09/04/20 118 lb (53.5 kg)   BMI Readings from Last 3 Encounters:  09/17/20 22.11 kg/m  09/10/20 22.11 kg/m  09/04/20 20.57 kg/m    Assessment/Interventions: Review of patient past medical history, allergies, medications, health status, including review of consultants reports, laboratory and other test data, was performed as part of comprehensive evaluation and provision of chronic care management services.   SDOH:  (Social Determinants of Health) assessments and interventions performed: Yes  SDOH Screenings   Alcohol Screen: Low Risk   . Last Alcohol Screening Score (AUDIT): 3  Depression (PHQ2-9): Low Risk   . PHQ-2 Score: 1  Financial Resource Strain: Low Risk   . Difficulty of Paying Living Expenses: Not very hard  Food Insecurity: No Food Insecurity  . Worried About Charity fundraiser in the Last Year: Never true  . Ran Out of Food in the Last Year: Never true  Housing: Low Risk   . Last Housing Risk Score: 0  Physical Activity: Inactive  . Days of Exercise per Week: 0 days  . Minutes of Exercise per Session: 0 min  Social Connections: Socially Integrated  . Frequency of Communication with Friends and Family: More than three times a week  . Frequency of Social Gatherings with Friends and Family: Once a week  . Attends Religious Services: More than 4 times per year  . Active Member of Clubs or Organizations: Yes  . Attends Theatre manager Meetings: More than 4 times per year  . Marital Status: Married  Stress: No Stress Concern Present  . Feeling of Stress : Not at all  Tobacco Use: Medium Risk  . Smoking Tobacco Use: Former Smoker  . Smokeless Tobacco Use: Never Used  Transportation Needs: No Transportation Needs  . Lack of Transportation (Medical): No  . Lack of Transportation (Non-Medical): No    CCM Care Plan  Allergies  Allergen Reactions  . Darvon [Propoxyphene  Hcl] Shortness Of Breath    ? Dose Related ? Lowered respirations greatly  . Meperidine Hcl Shortness Of Breath  . Penicillins Hives and Other (See Comments)    Has patient had a PCN reaction causing immediate rash, facial/tongue/throat swelling, SOB or lightheadedness with hypotension: No SEVERE RASH INVOLVING MUCUS MEMBRANES or SKIN NECROSIS: #  #  #  YES  #  #  #  Has patient had a PCN reaction that required hospitalization No Has patient had a PCN reaction occurring within the last 10 years: No.   . Azathioprine Other (See Comments)    Weakness  . Sulfa Antibiotics Other (See Comments)    States had severe indigestion and heartburn and nausea and had to stop taking  . Atorvastatin Nausea And Vomiting  . Demerol [Meperidine]     Other reaction(s): Respiratory Distress  . Leflunomide Other (See Comments)    Excessive weight gain  . Ezetimibe-Simvastatin Nausea And Vomiting    Medications Reviewed Today    Reviewed by Charlton Haws, [facility] (Pharmacist) on 10/07/20 at 1540  Med List Status: <None>  Medication Order Taking? Sig Documenting Provider Last Dose Status Informant  atorvastatin (LIPITOR) 10 MG tablet [id number] Yes TAKE 1 TAB ON MONDAYS AND FRIDAY Binnie Rail, MD Taking Active   Biotin 5000 MCG TABS [id number] Yes Take 5,000 mcg by mouth daily. [provider] Taking Active Self           Med Note Olena Heckle, Theone Stanley Apr 10, 2017  2:56 PM)    Calcium Carbonate-Vitamin D (CALCIUM + D PO) [id number] Yes Take  1 tablet by mouth daily.  [provider] Taking Active Self           Med Note Olena Heckle, Theone Stanley Apr 10, 2017  2:57 PM)    Carboxymethylcellul-Glycerin (LUBRICATING EYE DROPS OP) [id number] Yes Apply 1 drop to eye daily as needed (dry eyes). [provider] Taking Active Self  carvedilol (COREG) 6.25 MG tablet [id number] Yes Take 1 tablet (6.25 mg total) by mouth 2 (two) times daily with a meal. Burns, Claudina Lick, MD Taking Active   Cholecalciferol (VITAMIN D3) 2000 units capsule [id number] Yes Take 2,000 Units by mouth daily. [provider] Taking Active Self           Med Note Olena Heckle, Theone Stanley Apr 10, 2017  2:57 PM)    Cyanocobalamin (VITAMIN B-12 PO) [id number] Yes Take 1 tablet by mouth daily. [provider] Taking Active Self           Med Note Olena Heckle, Theone Stanley Apr 10, 2017  2:57 PM)    denosumab (PROLIA) 60 MG/ML SOSY injection [id number] Yes INJECT 60 MG INTO THE SKIN EVERY 6 (SIX) MONTHS. PLEASE DELIVER TO CLINIC: [facility], [place], [place] [address] Ofilia Neas, PA-C Taking Active   diazepam (VALIUM) 5 MG tablet [id number] Yes Take 1 tablet (5 mg total) by mouth at bedtime. Sater, Nanine Means, MD Taking Active   diclofenac sodium (VOLTAREN) 1 % GEL [id number] Yes APPLY 3 GRAMS TO 3 LARGE JOINTS 3 TIMES A DAY AS NEEDED Deveshwar, Shaili, MD Taking Active   famotidine (PEPCID) 40 MG tablet [id number] Yes TAKE 1 TABLET BY MOUTH EVERY DAY Burns, Claudina Lick, MD Taking Active   famotidine (PEPCID) 40 MG tablet [id number] Yes Take 1 tablet (40 mg total) by mouth 2 (two) times daily. Ladene Artist, MD Taking Active  folic acid (FOLVITE) 1 MG tablet [id number] Yes Take 1 tablet (1 mg total) by mouth daily. Ofilia Neas, PA-C Taking Active   furosemide (LASIX) 20 MG tablet [id number] Yes TAKE 1 TABLET BY MOUTH EVERY DAY Binnie Rail, MD Taking Active   methotrexate 50 MG/2ML injection [id number] Yes Inject 0.26m into the skin once weekly.  DOfilia Neas PA-C Taking Active   predniSONE (DELTASONE) 5 MG tablet [id number] TAKE 1 TABLET BY MOUTH EVERY DAY WITH BREAKFAST Deveshwar, SAbel Presto MD Taking Active   psyllium (METAMUCIL) 58.6 % powder [id number] Take 1 packet by mouth daily. [provider] Taking Active   Pyridoxine HCl (VITAMIN B-6 PO) [id number] Take 1 tablet by mouth daily. [provider] Taking Active Self           Med Note (Olena Heckle MTheone StanleyNov 16, 2018  2:58 PM)    traMADol (ULTRAM) 50 MG tablet [id number] Take 1 tablet (50 mg total) by mouth 2 (two) times daily. Must keep follow up 11/20/20 for future refills Burns, SClaudina Lick MD Taking Active   TUBERCULIN SYR 1CC/27GX1/2" 27G X 1/2" 1 ML MISC [id number] Use 1 syringe once weekly to inject methotrexate. DOfilia Neas PA-C Taking Active   Upadacitinib ER 15 MG TB24 [id number] TAKE 1 TABLET (15 MG) BY MOUTH DAILY. DOfilia Neas PA-C Taking Active           Patient Active Problem List   Diagnosis Date Noted  . Restless leg 02/02/2020  . Bilateral leg edema 01/11/2020  . Cervical radiculopathy 06/28/2019  . Carpal tunnel syndrome on both sides 06/28/2019  . Right sided sciatica 04/14/2018  . Trochanteric bursitis of right hip 04/14/2018  . S/P cervical spinal fusion 11/10/2017  . Dysphagia 10/26/2017  . Gastroesophageal reflux disease 08/06/2017  . Easy bruising 08/06/2017  . Mid back pain 03/26/2017  . Submandibular gland swelling 01/08/2017  . High risk medication use 09/02/2016  . Piriformis muscle pain 09/02/2016  . Right hip pain 04/08/2016  . Right knee pain 02/25/2016  . Numbness 11/14/2014  . Biceps tendon tear 10/10/2014  . Insomnia 10/10/2014  . Gait disturbance 06/13/2014  . OP (osteoporosis) 01/18/2014  . Other long term (current) drug therapy 01/18/2014  . Hoarseness 12/29/2013  . 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 Personal history of colonic polyps 07/03/2010  . Nocturia  05/28/2010  . THYROID NODULE 05/17/2008  . Multiple sclerosis ([place] 05/17/2008  . Other fatigue 03/30/2007  . Hyperlipidemia 08/20/2006  . Essential hypertension 08/20/2006  . Rheumatoid arthritis ([place] 08/20/2006  . CERVICAL CANCER, HX OF 08/20/2006  . GASTRIC ULCER, HX OF 08/20/2006    Immunization History  Administered Date(s) Administered  . Fluad Quad(high Dose 65+) 02/02/2020  . Influenza Whole 02/22/2007, 02/08/2013  . Influenza, High Dose Seasonal PF 02/04/2015, 02/18/2017, 02/21/2018, 01/16/2019  . Influenza-Unspecified 01/24/2014, 01/25/2016, 02/18/2017, 01/16/2019  . PFIZER(Purple Top)SARS-COV-2 Vaccination 06/18/2019, 07/09/2019, 01/09/2020, 07/03/2020  . Pneumococcal Conjugate-13 11/23/2012, 09/08/2014  . Pneumococcal Polysaccharide-23 05/26/2006  . Pneumococcal-Unspecified 12/28/2012  . Td 05/28/2010  . Zoster 05/26/2009  . Zoster Recombinat (Shingrix) 02/10/2019, 04/14/2019    Conditions to be addressed/monitored:  Hypertension and Hyperlipidemia, Multiple sclerosis, Rheumatoid arthritis  Care Plan : [place] Updates made by FCharlton Haws RWilsonvillesince 10/07/2020 12:00 AM    Problem: Hypertension and Hyperlipidemia, Multiple sclerosis, Rheumatoid arthritis   Priority: High    Long-Range Goal: Disease management   Start  Date: 10/07/2020  Expected End Date: 10/07/2021  This Visit's Progress: On track  Priority: High  Note:   Current Barriers:  . Unable to independently monitor therapeutic efficacy  Pharmacist Clinical Goal(s):  [name] Patient will achieve adherence to monitoring guidelines and medication adherence to achieve therapeutic efficacy through collaboration with PharmD and provider.   Interventions: . 1:1 collaboration with Binnie Rail, MD regarding development and update of comprehensive plan of care as evidenced by provider attestation and co-signature . Inter-disciplinary care team collaboration (see longitudinal plan of  care) . Comprehensive medication review performed; medication list updated in electronic medical record  Hypertension    BP goal is:  <130/80 Patient checks BP at home 1-2x per week Patient home BP readings are ranging: 106/70 - 120/70, HR ~70   Patient has failed these meds in the past: amlodipine, furosemide, metoprolol, Bystolic, ramipril Patient is currently controlled on the following medications:   Carvedilol 6.25 mg BID   We discussed diet and exercise extensively; benefits of BP control for CV risk reduction.   Plan: Continue current medications and control with diet and exercise    Hyperlipidemia    LDL goal < 100 Patient has failed these meds in past: n/a Patient is currently controlled on the following medications:   Atorvastatin 10 mg Mon and Fri   We discussed:  diet and exercise extensively; RA medications contribute to elevated lipids; benefits of statin for ASCVD prevention   Plan Continue current medications and control with diet and exercise   Osteoporosis    Last DEXA Scan: 01/11/2018             T-Score femoral neck: -3.4             T-Score total hip: -2.4             T-Score lumbar spine: -1.0             T-Score forearm radius: -3.9             10-year probability of major osteoporotic fracture: n/a             10-year probability of hip fracture: n/a   Patient is a candidate for pharmacologic treatment due to T-Score < -2.5 in femoral neck   Patient has failed these meds in past: alendronate Patient is currently controlled on the following medications:   Calcium carbonate 600 mg   Vitamin D 2000 IU daily  Prolia 60 mg q6 months (last given 07/05/20)    We discussed:  recommended doses of calcium/vitamin D;    Plan: Continue current medications    Multiple Sclerosis    Patient has failed these meds in past: lamotrigine (never started) Patient is currently controlled on the following medications:   Oxycodone 5 mg q4h PRN   Tramadol 50  mg TID prn - 1/2 tab 5pm, 1/2 tab at 8pm   We discussed:  Pt is worried about oxycodone addiction, she often avoids using unless she is in terrible pain; discussed risk of addiction with taking opioids as prescribed is low, and given her fear of addiction she is very unlikely to become dependent;   Plan:  Continue current medications    Rheumatoid arthritis    Patient has failed these meds in past: n/a Patient is currently controlled on the following medications:   Methotrexate 50 mg/2m inj - 0.6 mL once weekly  Prednisone 5 mg daily - stopped  Folic acid 1 mg -  2 tablets daily  Diclofenac 1% gel PRN  [name] 15 mg daily   We discussed:  Pt reports [name] is the only RA drug that has worked for her, so she pays for it despite high cost; she has recently stopped taking prednisone due to long-term prednisone side effects, her pain is worse because of this but she does not want to take steroids anymore; she is working with rheumatologist closely   Plan Continue current medications    Patient Goals/Self-Care Activities . Patient will:  - take medications as prescribed focus on medication adherence by pill box  Follow Up Plan: Telephone follow up appointment with care management team member scheduled for: 1 year      Medication Assistance: None required.  Patient affirms current coverage meets needs.  Patient's preferred pharmacy is:  [id number] [place] [id number]: [phone]: [phone] Uses pill box? Yes Pt endorses 100% compliance  We discussed: Current pharmacy is preferred with insurance plan and patient is satisfied with pharmacy services Patient decided to: Continue current medication management strategy  Care Plan and Follow Up Patient Decision:  Patient agrees to Care Plan and Follow-up.  Plan: Telephone follow up appointment with care management team member scheduled for:   6 months   LCharlene Brooke PharmD, BBerthold CPP Clinical Pharmacist [phone]

## 2020-10-03 ENCOUNTER — Ambulatory Visit: Payer: Medicare Other | Admitting: Family Medicine

## 2020-10-04 ENCOUNTER — Telehealth: Payer: Self-pay | Admitting: *Deleted

## 2020-10-04 ENCOUNTER — Encounter: Payer: Self-pay | Admitting: Physician Assistant

## 2020-10-04 NOTE — Telephone Encounter (Signed)
Pathology to patient.  °

## 2020-10-04 NOTE — Telephone Encounter (Signed)
-----   Message from Warren Danes, [name] sent at 10/04/2020 11:24 AM EDT ----- RTC if recurs. Treated with biopsy

## 2020-10-04 NOTE — Progress Notes (Signed)
   Follow-Up Visit   Subjective  Sherri [name] is a 80 y.o. female who presents for the following: Annual Exam (No new concerns- Personal history of multiple non mole skin cancers denies any history of melanoma.  Patient denies any family history of melanoma or non mole skin cancers. ).   The following portions of the chart were reviewed this encounter and updated as appropriate:  Tobacco  Allergies  Meds  Problems  Med Hx  Surg Hx  Fam Hx      Objective  Well appearing patient in no apparent distress; mood and affect are within normal limits.  A full examination was performed including scalp, head, eyes, ears, nose, lips, neck, chest, axillae, abdomen, back, buttocks, bilateral upper extremities, bilateral lower extremities, hands, feet, fingers, toes, fingernails, and toenails. All findings within normal limits unless otherwise noted below.  Objective  Erythematous patches with gritty scale.  Right Lower Leg - Anterior: Hyperkeratotic scale with pink base   Images     Assessment & Plan  Actinic keratosis Right Lower Leg - Anterior  Skin / nail biopsy - Right Lower Leg - Anterior Type of biopsy: tangential   Informed consent: discussed and consent obtained   Timeout: patient name, date of birth, surgical site, and procedure verified   Anesthesia: the lesion was anesthetized in a standard fashion   Anesthetic:  1% lidocaine w/ epinephrine 1-100,000 local infiltration Instrument used: flexible razor blade   Hemostasis achieved with: ferric subsulfate   Outcome: patient tolerated procedure well   Post-procedure details: wound care instructions given    Destruction of lesion - Right Lower Leg - Anterior Complexity: simple   Destruction method: electrodesiccation and curettage   Informed consent: discussed and consent obtained   Timeout:  patient name, date of birth, surgical site, and procedure verified Anesthesia: the lesion was anesthetized in a standard  fashion   Anesthetic:  1% lidocaine w/ epinephrine 1-100,000 local infiltration Curettage performed in three different directions: Yes   Curettage cycles:  3 Margin per side (cm):  0.1 Final wound size (cm):  1 Hemostasis achieved with:  aluminum chloride Outcome: patient tolerated procedure well with no complications   Post-procedure details: wound care instructions given    Specimen 1 - Surgical pathology Differential Diagnosis: scc vs bcc  Check Margins: No txpbx  Lentigines - Scattered tan macules - Discussed due to sun exposure - Benign, observe - Call for any changes  Seborrheic Keratoses - Stuck-on, waxy, tan-brown papules and plaques  - Discussed benign etiology and prognosis. - Observe - Call for any changes  Melanocytic Nevi - Tan-brown and/or pink-flesh-colored symmetric macules and papules - Benign appearing on exam today - Observation - Call clinic for new or changing moles - Recommend daily use of broad spectrum spf 30+ sunscreen to sun-exposed areas.   Hemangiomas - Red papules - Discussed benign nature - Observe - Call for any changes  Actinic Damage - diffuse scaly erythematous macules with underlying dyspigmentation - Recommend daily broad spectrum sunscreen SPF 30+ to sun-exposed areas, reapply every 2 hours as needed.  - Call for new or changing lesions.  Skin cancer screening performed today.   I, Gara Kincade, PA-C, have reviewed all documentation's for this visit.  The documentation on 10/04/20 for the exam, diagnosis, procedures and orders are all accurate and complete.

## 2020-10-05 ENCOUNTER — Ambulatory Visit
Admission: RE | Admit: 2020-10-05 | Discharge: 2020-10-05 | Disposition: A | Discharge status: Home / Self Care | Payer: Medicare Other | Source: Ambulatory Visit | Attending: Family Medicine | Admitting: Family Medicine

## 2020-10-05 DIAGNOSIS — M546 Pain in thoracic spine: Secondary | ICD-10-CM

## 2020-10-05 DIAGNOSIS — M5124 Other intervertebral disc displacement, thoracic region: Secondary | ICD-10-CM | POA: Diagnosis not present

## 2020-10-05 DIAGNOSIS — M40204 Unspecified kyphosis, thoracic region: Secondary | ICD-10-CM | POA: Diagnosis not present

## 2020-10-07 NOTE — Patient Instructions (Signed)
Visit Information  Phone number for Pharmacist: [phone]  Goals Addressed            This Visit's Progress   . Manage My Medicine       Timeframe:  Long-Range Goal Priority:  Medium Start Date:       10/02/20                      Expected End Date:  10/02/21                     Follow Up Date 04/24/21   - call for medicine refill 2 or 3 days before it runs out - call if I am sick and can't take my medicine - keep a list of all the medicines I take; vitamins and herbals too - use a pillbox to sort medicine    Why is this important?   . These steps will help you keep on track with your medicines.   Notes:       Patient verbalizes understanding of instructions provided today and agrees to view in [place].  Telephone follow up appointment with pharmacy team member scheduled for: 1 year  Charlene Brooke, PharmD, [place], CPP Clinical Pharmacist [facility] [phone]

## 2020-10-08 ENCOUNTER — Telehealth: Payer: Self-pay | Admitting: Family Medicine

## 2020-10-08 ENCOUNTER — Telehealth: Payer: Self-pay | Admitting: Rheumatology

## 2020-10-08 DIAGNOSIS — M546 Pain in thoracic spine: Secondary | ICD-10-CM

## 2020-10-08 NOTE — Telephone Encounter (Signed)
Please advise 

## 2020-10-08 NOTE — Telephone Encounter (Signed)
Patient calling in reference to MRI she had on her Thoracic Spine ordered by Dr. Junius Roads. Dr. Junius Roads recommended she talk further in reference to her CBC report in relation to MRI report. Please call to advise.

## 2020-10-08 NOTE — Telephone Encounter (Signed)
MRI looks good, no fractures seen.  No nerve impingement.  There are bone marrow changes consistent with anemia.  Be sure to keep this monitored by Dr. Quay Burow.

## 2020-10-08 NOTE — Telephone Encounter (Addendum)
I called patient, patient does not want to be referred to hematology.

## 2020-10-08 NOTE — Addendum Note (Signed)
Addended by: Hortencia Pilar on: 10/08/2020 03:22 PM   Modules accepted: Orders

## 2020-10-08 NOTE — Telephone Encounter (Signed)
The MRI showed some changes in the bone marrow.  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  We can refer her to hematology if she would prefer further evaluation.

## 2020-10-09 ENCOUNTER — Other Ambulatory Visit: Payer: Self-pay

## 2020-10-09 MED ORDER — FLUCONAZOLE 100 MG PO TABS: 100.0000 mg | ORAL_TABLET | Freq: Every day | ORAL | 0 refills | Status: DC

## 2020-10-12 ENCOUNTER — Encounter: Payer: Self-pay | Admitting: Physical Therapy

## 2020-10-12 ENCOUNTER — Other Ambulatory Visit: Payer: Self-pay

## 2020-10-12 ENCOUNTER — Ambulatory Visit: Payer: Medicare Other | Admitting: Physical Therapy

## 2020-10-12 DIAGNOSIS — M546 Pain in thoracic spine: Secondary | ICD-10-CM

## 2020-10-12 DIAGNOSIS — M62838 Other muscle spasm: Secondary | ICD-10-CM | POA: Diagnosis not present

## 2020-10-12 NOTE — Therapy (Signed)
[facility] [address] [place], [place], [address] Phone: [phone]   Fax:  [phone]  Physical Therapy Evaluation  Patient Details  Name: Sherri [name] MRN: [id number] Date of Birth: 1940-05-31 Referring Provider (PT): Eunice Blase   Encounter Date: 10/12/2020   PT End of Session - 10/12/20 1440    Visit Number 1    Number of Visits 12    Date for PT Re-Evaluation 11/23/20    Authorization Type [employer] Medicare    PT Start Time [id number]    PT Stop Time 1350    PT Time Calculation (min) 45 min    Activity Tolerance Patient tolerated treatment well    Behavior During Therapy [name] for tasks assessed/performed           Past Medical History:  Diagnosis Date  . Anxiety    takes Valium daily as needed  . Basal cell carcinoma 01/21/1988   left nostril (MOHS), sup-right calf (CX35FU)  . Basal cell carcinoma 03/22/1996   right calf (CX35FU)  . Basal cell carcinoma 03/31/1995   left wing nose  . Bruises easily    d/t meds  . Cancer ([place])    basal cell ca, in situ- uterine   . Cataracts, bilateral    removed bilateral  . Chronic back pain   . Dizziness    r/t to meds  . GERD (gastroesophageal reflux disease)    no meds on a regular basis but will take Tums if needed  . Headache(784.0)    r/t neck issues  . History of bronchitis 6-104yrs ago  . History of colon polyps    benign  . Hyperlipidemia    takes Atorvastatin on Mondays and Fridays  . Hypertension    takes Ramipril daily  . Joint pain   . Joint swelling   . Multiple sclerosis ([place])   . Neuromuscular disorder ([place])    Dr. Park Liter- [facility], follows M.S.  . Nocturia   . PONV (postoperative nausea and vomiting)    trouble urinating after surgery in 2014  . Postoperative nausea and vomiting 01/11/2019  . Rheumatoid arthritis ([place])    Dr Jani Gravel weekly, RA- hands- knees- feet   . Rheumatoid arthritis(714.0)    Dr Estanislado Pandy takes [name]  daily  . Right wrist fracture   . Skin cancer   . Squamous cell carcinoma of skin 11/02/2001   in situ-right knee (cx38fu)  . Squamous cell carcinoma of skin 11/12/2011   in situ-left forearm (CX35FU), in situ-left foot (CX35FU)  . Squamous cell carcinoma of skin 01/22/2012   in situ-left lower forearm (txpbx)  . Squamous cell carcinoma of skin 11/17/2012   right shin (txpbx)  . Squamous cell carcinoma of skin 10/28/2013   in situ-right shoulder (CX35FU)  . Squamous cell carcinoma of skin 04/28/2014   well diff-right shin (txpbx)  . Squamous cell carcinoma of skin 11/02/2014   in situ-Left shin (txpbx)  . Squamous cell carcinoma of skin 12/15/2014   in situ-Left hand (txpbx), bowens-left side chest (txpbx)  . Squamous cell carcinoma of skin 05/09/2015   in situ-left hand (txpbx)   . Squamous cell carcinoma of skin 05/07/2016   in situ-left inner shin,ant, in situ-left inner shin, post, in situ-left outer forearm, in situ-right knuckle  . Squamous cell carcinoma of skin 03/1302018   in situ-left shoulder (txpbx), in situ-right inner shin (txpbx), in situ-top of left foot (txpbx), KA- left forearm (txpbx)  . Squamous cell carcinoma of skin  12/02/2016   in situ-left inner shin (txpbx)  . Squamous cell carcinoma of skin 03/04/2017   in situ-left outer sup, shin (txpbx), in situ- right 2nd knuckle finger (txpbx), in situ- Left wrist (txpbx)  . Squamous cell carcinoma of skin 04/06/2018   in situ-above left knee inner (txpbx)  . Squamous cell carcinoma of skin 04/21/2018   in situ-right top hand (txpbx)  . Squamous cell carcinoma of skin 07/08/2018   in situ-right lower inner shin (txpbx)  . Squamous cell carcinoma of skin 04/27/2019   in situ- Left neck(CX35FU), In situ- right neck (CX35FU)  . Urinary retention    sees Dr.Wrenn about 2 times a yr    Past Surgical History:  Procedure Laterality Date  . ABDOMINAL HYSTERECTOMY     1985  . APPENDECTOMY     with TAH  . BACK  SURGERY     several  . BREAST BIOPSY Right    Results were negative  . BROW LIFT Bilateral 10/02/2016   Procedure: BILATERAL LOWER LID BLEPHAROPLASTY;  Surgeon: Clista Bernhardt, MD;  Location: [facility];  Service: Plastics;  Laterality: Bilateral;  . CARPAL TUNNEL RELEASE Right   . cataracts    . CERVICAL FUSION     Dr Joya Salm  . CERVICAL FUSION  12/31/2012   Dr Joya Salm  . CHEST TUBE INSERTION     for traumatic Pneumothorax  . COLONOSCOPY  07/16/2010   normal   . COLONOSCOPY W/ POLYPECTOMY  1997   negative since; Dr Deatra Ina  . ESOPHAGOGASTRODUODENOSCOPY  07/16/2010   normal  . eye lid raise    . EYE SURGERY Bilateral    cataracts removed - /w IOL  . JOINT REPLACEMENT    . LUMBAR FUSION     Dr Joya Salm  . NASAL SINUS SURGERY    . POSTERIOR CERVICAL FUSION/FORAMINOTOMY N/A 12/31/2012   Procedure: POSTERIOR LATERAL CERVICAL FUSION/FORAMINOTOMY LEVEL 1 CERVICAL THREE-FOUR WITH LATERAL MASS SCREWS;  Surgeon: Floyce Stakes, MD;  Location: [facility];  Service: Neurosurgery;  Laterality: N/A;  . PTOSIS REPAIR Bilateral 10/02/2016   Procedure: INTERNAL PTOSIS REPAIR;  Surgeon: Clista Bernhardt, MD;  Location: [facility];  Service: Plastics;  Laterality: Bilateral;  . SKIN BIOPSY    . THYROID SURGERY     R lobe removed- 1972, has grown back - CT last done- 2018  . TOTAL HIP ARTHROPLASTY  12/22/2011   Procedure: TOTAL HIP ARTHROPLASTY;  Surgeon: Kerin Salen, MD;  Location: [facility];  Service: Orthopedics;  Laterality: Left;  . TOTAL SHOULDER ARTHROPLASTY    . TUBAL LIGATION    . UPPER GASTROINTESTINAL ENDOSCOPY  2012   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    There were no vitals filed for this visit.    Subjective Assessment - 10/12/20 1434    Subjective Pt states increased pain on 5/5, was getting out of bathtub, and twisted to the R. She states she has had some mild thoracic pain for a long time, but this is much worse. She states constant pain that has not improved since onset. Has had  x-ray and MRI, negative for fracture. She does have osteoporosis. States pain is very bothersome, with most movement and shoulder movment.    Pertinent History , RA, Osteoporosis, MS, Skin cancer    Limitations House hold activities;Writing;Standing;Lifting;Reading;Sitting    Patient Stated Goals decreased pain, going away june 11th on vacation    Currently in Pain? Yes    Pain Score 6  Pain Location Back    Pain Orientation Right    Pain Descriptors / Indicators Spasm;Sore;Tightness;Sharp    Pain Type Acute pain    Pain Onset 1 to 4 weeks ago    Pain Frequency Constant    Aggravating Factors  most movement    Pain Relieving Factors none              [facility] PT Assessment - 10/12/20 0001      Assessment   Medical Diagnosis Thoracic back pain    Referring Provider (PT) Legrand Como [place]    Prior Therapy not for back      Precautions   Precautions None      Balance Screen   Has the patient fallen in the past 6 months No      Prior Function   Level of Independence Independent      Cognition   Overall Cognitive Status Within Functional Limits for tasks assessed      ROM / Strength   AROM / PROM / Strength AROM;Strength      AROM   Overall AROM Comments thoracic: flex, rotation WNL, mod limitation for extension,  Cervical: WFL, significant limiation for SB, bil;      Strength   Overall Strength Comments shoulders: 4 +/5 gross      Palpation   Palpation comment non tender T-spine with PAs. non tender thoracic ribs laterally. Tenderness and hypertonicity in mid thoracic paraspinal, as well as rhomboid.      Special Tests   Other special tests no radicular pain or parasthesias                      Objective measurements completed on examination: See above findings.       [facility] Adult PT Treatment/Exercise - 10/12/20 0001      Exercises   Exercises Lumbar      Lumbar Exercises: Stretches   Other Lumbar Stretch Exercise seated: mid back flexion  stretch: 30 sec x 2;  Cross arm shoulder stretch 30 sec x 3 on R;      Manual Therapy   Manual Therapy Joint mobilization;Soft tissue mobilization    Manual therapy comments skilled palpation and monitoring of soft tissue with dry needling.    Joint Mobilization Thoracic PA mobs    Soft tissue mobilization DTM and TPR to thoracic erector spinae, and rhomoboid.            Trigger Point Dry Needling - 10/12/20 0001    Consent Given? Yes    Education Handout Provided Yes    Muscles Treated Upper Quadrant Rhomboids    Muscles Treated Back/Hip Erector spinae    Rhomboids Response Twitch response elicited;Palpable increased muscle length   R   Erector spinae Response Twitch response elicited;Palpable increased muscle length   R mid thoracic               PT Education - 10/12/20 1439    Education Details PT POC, Exam findings, HEP, Discussed dry needling effects, benefits, precautions,    Person(s) Educated Patient    Methods Explanation;Demonstration;Tactile cues;Verbal cues    Comprehension Verbalized understanding;Returned demonstration;Verbal cues required;Tactile cues required;Need further instruction            PT Short Term Goals - 10/12/20 1457      PT SHORT TERM GOAL #1   Title Pt to be independent with initial HEP    Time 2    Period Weeks    Status New  Target Date 10/26/20             PT Long Term Goals - 10/12/20 1457      PT LONG TERM GOAL #1   Title Pt to be independent with final HEP    Time 6    Period Weeks    Status New    Target Date 11/23/20      PT LONG TERM GOAL #2   Title Pt to report decreased pain in R thoracic region to 0-2/10 with activity    Time 6    Period Weeks    Status New    Target Date 11/23/20      PT LONG TERM GOAL #3   Title Pt to demo soft tissue limitations to be WNL in thoraic region,  to improve pain and abilityfor functional activity    Time 6    Period Weeks    Status New    Target Date 11/23/20                   Plan - 10/12/20 1459    Clinical Impression Statement Pt presents with primary complaint of increased pain in R thoracic region. She has most pain in R sided musculature, paraspinals and rhomoboids. She has minimal pain to reproduce in spine today. Pt with increased pain with most movements or spine and R shoulder. Symptoms consistent with muscle spasm today, and has had negative imagiing for fracture. She has decreased ability for full functional activities due to pain and deficit. Pt to benefit from skilled PT to improve deficits and pain. Dry needing and manual soft tissue mobilization done today, pt states improved pain after session, but will continue to assess next visit for improvement.    Personal Factors and Comorbidities Comorbidity 1    Comorbidities RA, OP, MS, Skin CA    Examination-Activity Limitations Reach Overhead;Bend;Sleep;Carry;Stand;Lift;Transfers;Locomotion Level;Dressing    Examination-Participation Restrictions Meal Prep;Cleaning;Community Activity;Driving;Shop;[employer]    Stability/Clinical Decision Making Stable/Uncomplicated    Clinical Decision Making Low    Rehab Potential Good    PT Frequency 2x / week    PT Duration 6 weeks    PT Treatment/Interventions ADLs/Self Care Home Management;Canalith Repostioning;Cryotherapy;Electrical Stimulation;Iontophoresis 4mg /ml Dexamethasone;Moist Heat;Traction;Ultrasound;DME Instruction;Gait training;Neuromuscular re-education;Therapeutic exercise;Therapeutic activities;Functional mobility training;Patient/family education;Orthotic Fit/Training;Manual techniques;Taping;Dry needling;Passive range of motion;Visual/perceptual remediation/compensation;Spinal Manipulations;Joint Manipulations    PT Home Exercise Plan Mid back and cross arm shoulder stretch, will issue HEP with handout next session.    Consulted and Agree with Plan of Care Patient           Patient will benefit from skilled therapeutic  intervention in order to improve the following deficits and impairments:  Decreased range of motion,Increased muscle spasms,Decreased activity tolerance,Pain,Impaired flexibility,Decreased mobility,Decreased strength  Visit Diagnosis: Pain in thoracic spine  Other muscle spasm     Problem List Patient Active Problem List   Diagnosis Date Noted  . Restless leg 02/02/2020  . Bilateral leg edema 01/11/2020  . Cervical radiculopathy 06/28/2019  . Carpal tunnel syndrome on both sides 06/28/2019  . Right sided sciatica 04/14/2018  . Trochanteric bursitis of right hip 04/14/2018  . S/P cervical spinal fusion 11/10/2017  . Dysphagia 10/26/2017  . Gastroesophageal reflux disease 08/06/2017  . Easy bruising 08/06/2017  . Mid back pain 03/26/2017  . Submandibular gland swelling 01/08/2017  . High risk medication use 09/02/2016  . Piriformis muscle pain 09/02/2016  . Right hip pain 04/08/2016  . Right knee pain 02/25/2016  . Numbness 11/14/2014  . Biceps  tendon tear 10/10/2014  . Insomnia 10/10/2014  . Gait disturbance 06/13/2014  . OP (osteoporosis) 01/18/2014  . Other long term (current) drug therapy 01/18/2014  . Hoarseness 12/29/2013  . 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 Personal history of colonic polyps 07/03/2010  . Nocturia 05/28/2010  . THYROID NODULE 05/17/2008  . Multiple sclerosis ([place]) 05/17/2008  . Other fatigue 03/30/2007  . Hyperlipidemia 08/20/2006  . Essential hypertension 08/20/2006  . Rheumatoid arthritis ([place]) 08/20/2006  . CERVICAL CANCER, HX OF 08/20/2006  . GASTRIC ULCER, HX OF 08/20/2006    Lyndee Hensen, PT, DPT 3:09 PM  10/12/20    [facility] [place], [place], [address] Phone: [phone]   Fax:  [phone]  Name: MITZY NARON MRN: [id number] Date of Birth: [birth year]

## 2020-10-15 ENCOUNTER — Ambulatory Visit: Payer: Medicare Other | Admitting: Physical Therapy

## 2020-10-15 ENCOUNTER — Other Ambulatory Visit: Payer: Self-pay

## 2020-10-15 ENCOUNTER — Encounter: Payer: Self-pay | Admitting: Physical Therapy

## 2020-10-15 DIAGNOSIS — M62838 Other muscle spasm: Secondary | ICD-10-CM

## 2020-10-15 DIAGNOSIS — M546 Pain in thoracic spine: Secondary | ICD-10-CM

## 2020-10-15 NOTE — Therapy (Signed)
[facility] [address] [place], [place], [address] Phone: [phone]   Fax:  [phone]  Physical Therapy Treatment  Patient Details  Name: Sherri [name] MRN: [id number] Date of Birth: [birth year] Referring Provider (PT): Eunice Blase   Encounter Date: 10/15/2020   PT End of Session - 10/15/20 1302    Visit Number 2    Number of Visits 12    Date for PT Re-Evaluation 11/23/20    Authorization Type UHC Medicare    PT Start Time 1300    PT Stop Time 1345    PT Time Calculation (min) 45 min    Activity Tolerance Patient tolerated treatment well    Behavior During Therapy [facility] for tasks assessed/performed           Past Medical History:  Diagnosis Date  . Anxiety    takes Valium daily as needed  . Basal cell carcinoma 01/21/1988   left nostril (MOHS), sup-right calf (CX35FU)  . Basal cell carcinoma 03/22/1996   right calf (CX35FU)  . Basal cell carcinoma 03/31/1995   left wing nose  . Bruises easily    d/t meds  . Cancer ([place])    basal cell ca, in situ- uterine   . Cataracts, bilateral    removed bilateral  . Chronic back pain   . Dizziness    r/t to meds  . GERD (gastroesophageal reflux disease)    no meds on a regular basis but will take Tums if needed  . Headache(784.0)    r/t neck issues  . History of bronchitis 6-40yrs ago  . History of colon polyps    benign  . Hyperlipidemia    takes Atorvastatin on Mondays and Fridays  . Hypertension    takes Ramipril daily  . Joint pain   . Joint swelling   . Multiple sclerosis ([place])   . Neuromuscular disorder ([place])    Dr. Park Liter- [facility], follows M.S.  . Nocturia   . PONV (postoperative nausea and vomiting)    trouble urinating after surgery in 2014  . Postoperative nausea and vomiting 01/11/2019  . Rheumatoid arthritis ([place])    Dr Jani Gravel weekly, RA- hands- knees- feet   . Rheumatoid arthritis(714.0)    Dr Estanislado Pandy takes [name]  daily  . Right wrist fracture   . Skin cancer   . Squamous cell carcinoma of skin 11/02/2001   in situ-right knee (cx59fu)  . Squamous cell carcinoma of skin 11/12/2011   in situ-left forearm (CX35FU), in situ-left foot (CX35FU)  . Squamous cell carcinoma of skin 01/22/2012   in situ-left lower forearm (txpbx)  . Squamous cell carcinoma of skin 11/17/2012   right shin (txpbx)  . Squamous cell carcinoma of skin 10/28/2013   in situ-right shoulder (CX35FU)  . Squamous cell carcinoma of skin 04/28/2014   well diff-right shin (txpbx)  . Squamous cell carcinoma of skin 11/02/2014   in situ-Left shin (txpbx)  . Squamous cell carcinoma of skin 12/15/2014   in situ-Left hand (txpbx), bowens-left side chest (txpbx)  . Squamous cell carcinoma of skin 05/09/2015   in situ-left hand (txpbx)   . Squamous cell carcinoma of skin 05/07/2016   in situ-left inner shin,ant, in situ-left inner shin, post, in situ-left outer forearm, in situ-right knuckle  . Squamous cell carcinoma of skin 03/1302018   in situ-left shoulder (txpbx), in situ-right inner shin (txpbx), in situ-top of left foot (txpbx), KA- left forearm (txpbx)  . Squamous cell carcinoma of skin  12/02/2016   in situ-left inner shin (txpbx)  . Squamous cell carcinoma of skin 03/04/2017   in situ-left outer sup, shin (txpbx), in situ- right 2nd knuckle finger (txpbx), in situ- Left wrist (txpbx)  . Squamous cell carcinoma of skin 04/06/2018   in situ-above left knee inner (txpbx)  . Squamous cell carcinoma of skin 04/21/2018   in situ-right top hand (txpbx)  . Squamous cell carcinoma of skin 07/08/2018   in situ-right lower inner shin (txpbx)  . Squamous cell carcinoma of skin 04/27/2019   in situ- Left neck(CX35FU), In situ- right neck (CX35FU)  . Urinary retention    sees Dr.Wrenn about 2 times a yr    Past Surgical History:  Procedure Laterality Date  . ABDOMINAL HYSTERECTOMY     1985  . APPENDECTOMY     with TAH  . BACK  SURGERY     several  . BREAST BIOPSY Right    Results were negative  . BROW LIFT Bilateral 10/02/2016   Procedure: BILATERAL LOWER LID BLEPHAROPLASTY;  Surgeon: Clista Bernhardt, MD;  Location: [facility];  Service: Plastics;  Laterality: Bilateral;  . CARPAL TUNNEL RELEASE Right   . cataracts    . CERVICAL FUSION     Dr Joya Salm  . CERVICAL FUSION  12/31/2012   Dr Joya Salm  . CHEST TUBE INSERTION     for traumatic Pneumothorax  . COLONOSCOPY  07/16/2010   normal   . COLONOSCOPY W/ POLYPECTOMY  1997   negative since; Dr Deatra Ina  . ESOPHAGOGASTRODUODENOSCOPY  07/16/2010   normal  . eye lid raise    . EYE SURGERY Bilateral    cataracts removed - /w IOL  . JOINT REPLACEMENT    . LUMBAR FUSION     Dr Joya Salm  . NASAL SINUS SURGERY    . POSTERIOR CERVICAL FUSION/FORAMINOTOMY N/A 12/31/2012   Procedure: POSTERIOR LATERAL CERVICAL FUSION/FORAMINOTOMY LEVEL 1 CERVICAL THREE-FOUR WITH LATERAL MASS SCREWS;  Surgeon: Floyce Stakes, MD;  Location: [facility];  Service: Neurosurgery;  Laterality: N/A;  . PTOSIS REPAIR Bilateral 10/02/2016   Procedure: INTERNAL PTOSIS REPAIR;  Surgeon: Clista Bernhardt, MD;  Location: [facility];  Service: Plastics;  Laterality: Bilateral;  . SKIN BIOPSY    . THYROID SURGERY     R lobe removed- 1972, has grown back - CT last done- 2018  . TOTAL HIP ARTHROPLASTY  12/22/2011   Procedure: TOTAL HIP ARTHROPLASTY;  Surgeon: Kerin Salen, MD;  Location: [facility];  Service: Orthopedics;  Laterality: Left;  . TOTAL SHOULDER ARTHROPLASTY    . TUBAL LIGATION    . UPPER GASTROINTESTINAL ENDOSCOPY  2012   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    There were no vitals filed for this visit.   Subjective Assessment - 10/15/20 1300    Subjective Pt states improving pain. was able to sleep better, but still having to take Tramadol.    Pertinent History , RA, Osteoporosis, MS, Skin cancer    Limitations House hold activities;Writing;Standing;Lifting;Reading;Sitting    Patient Stated  Goals decreased pain, going away june 11th on vacation    Currently in Pain? Yes    Pain Score 5     Pain Location Back    Pain Orientation Right    Pain Descriptors / Indicators Spasm;Sore;Tightness    Pain Type Acute pain    Pain Onset 1 to 4 weeks ago    Pain Frequency Constant  [facility] Adult PT Treatment/Exercise - 10/15/20 0001      Exercises   Exercises Lumbar      Lumbar Exercises: Stretches   Other Lumbar Stretch Exercise seated: mid back flexion stretch: 30 sec x 2;  Cross arm shoulder stretch 30 sec x 3 on R;      Modalities   Modalities Moist Heat      Moist Heat Therapy   Number Minutes Moist Heat 10 Minutes    Moist Heat Location Other (comment)   thoracic spine     Manual Therapy   Manual Therapy Joint mobilization;Soft tissue mobilization    Manual therapy comments skilled palpation and monitoring of soft tissue with dry needling.    Joint Mobilization Thoracic PA mobs, and UPA on R;    Soft tissue mobilization DTM and TPR to thoracic paraspinals and rhomoboid.            Trigger Point Dry Needling - 10/15/20 0001    Consent Given? Yes    Education Handout Provided Previously provided    Muscles Treated Back/Hip Thoracic multifidi    Erector spinae Response Palpable increased muscle length   R   Thoracic multifidi response Palpable increased muscle length   R T6-8, and 8-9                 PT Short Term Goals - 10/12/20 1457      PT SHORT TERM GOAL #1   Title Pt to be independent with initial HEP    Time 2    Period Weeks    Status New    Target Date 10/26/20             PT Long Term Goals - 10/12/20 1457      PT LONG TERM GOAL #1   Title Pt to be independent with final HEP    Time 6    Period Weeks    Status New    Target Date 11/23/20      PT LONG TERM GOAL #2   Title Pt to report decreased pain in R thoracic region to 0-2/10 with activity    Time 6    Period Weeks    Status New     Target Date 11/23/20      PT LONG TERM GOAL #3   Title Pt to demo soft tissue limitations to be WNL in thoraic region,  to improve pain and abilityfor functional activity    Time 6    Period Weeks    Status New    Target Date 11/23/20                 Plan - 10/15/20 1346    Clinical Impression Statement Pt wtih decreased muscle tension in R rhomboid region. Continued manual DTM and TPR as well as DN due to positive response for pain relief last session. Plan to continue manual for pain relief, and progress strengthening as tolerated.    Personal Factors and Comorbidities Comorbidity 1    Comorbidities RA, OP, MS, Skin CA    Examination-Activity Limitations Reach Overhead;Bend;Sleep;Carry;Stand;Lift;Transfers;Locomotion Level;Dressing    Examination-Participation Restrictions Meal Prep;Cleaning;Community Activity;Driving;Shop;[employer]    Stability/Clinical Decision Making Stable/Uncomplicated    Rehab Potential Good    PT Frequency 2x / week    PT Duration 6 weeks    PT Treatment/Interventions ADLs/Self Care Home Management;Canalith Repostioning;Cryotherapy;Electrical Stimulation;Iontophoresis 4mg /ml Dexamethasone;Moist Heat;Traction;Ultrasound;DME Instruction;Gait training;Neuromuscular re-education;Therapeutic exercise;Therapeutic activities;Functional mobility training;Patient/family education;Orthotic Fit/Training;Manual techniques;Taping;Dry needling;Passive range of motion;Visual/perceptual remediation/compensation;Spinal Manipulations;Joint Manipulations  PT Home Exercise Plan Mid back and cross arm shoulder stretch, will issue HEP with handout next session.    Consulted and Agree with Plan of Care Patient           Patient will benefit from skilled therapeutic intervention in order to improve the following deficits and impairments:  Decreased range of motion,Increased muscle spasms,Decreased activity tolerance,Pain,Impaired flexibility,Decreased  mobility,Decreased strength  Visit Diagnosis: Pain in thoracic spine  Other muscle spasm     Problem List Patient Active Problem List   Diagnosis Date Noted  . Restless leg 02/02/2020  . Bilateral leg edema 01/11/2020  . Cervical radiculopathy 06/28/2019  . Carpal tunnel syndrome on both sides 06/28/2019  . Right sided sciatica 04/14/2018  . Trochanteric bursitis of right hip 04/14/2018  . S/P cervical spinal fusion 11/10/2017  . Dysphagia 10/26/2017  . Gastroesophageal reflux disease 08/06/2017  . Easy bruising 08/06/2017  . Mid back pain 03/26/2017  . Submandibular gland swelling 01/08/2017  . High risk medication use 09/02/2016  . Piriformis muscle pain 09/02/2016  . Right hip pain 04/08/2016  . Right knee pain 02/25/2016  . Numbness 11/14/2014  . Biceps tendon tear 10/10/2014  . Insomnia 10/10/2014  . Gait disturbance 06/13/2014  . OP (osteoporosis) 01/18/2014  . Other long term (current) drug therapy 01/18/2014  . Hoarseness 12/29/2013  . 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 Personal history of colonic polyps 07/03/2010  . Nocturia 05/28/2010  . THYROID NODULE 05/17/2008  . Multiple sclerosis ([place]) 05/17/2008  . Other fatigue 03/30/2007  . Hyperlipidemia 08/20/2006  . Essential hypertension 08/20/2006  . Rheumatoid arthritis ([place]) 08/20/2006  . CERVICAL CANCER, HX OF 08/20/2006  . GASTRIC ULCER, HX OF 08/20/2006    Lyndee Hensen, PT, DPT 1:47 PM  10/15/20    [place], [place], [address] Phone: [phone]   Fax:  [phone]  Name: Sherri [name] MRN: [id number] Date of Birth: 08/17/1940

## 2020-10-18 ENCOUNTER — Telehealth: Payer: Self-pay | Admitting: Rheumatology

## 2020-10-18 ENCOUNTER — Ambulatory Visit: Payer: Medicare Other | Admitting: Physical Therapy

## 2020-10-18 ENCOUNTER — Other Ambulatory Visit: Payer: Self-pay

## 2020-10-18 DIAGNOSIS — M62838 Other muscle spasm: Secondary | ICD-10-CM

## 2020-10-18 DIAGNOSIS — M546 Pain in thoracic spine: Secondary | ICD-10-CM | POA: Diagnosis not present

## 2020-10-18 NOTE — Telephone Encounter (Signed)
Patient requesting a sample of [name], if possible. Per patient, she has enough for 4 days.

## 2020-10-18 NOTE — Telephone Encounter (Signed)
Medication Samples have been provided to the patient.  Drug name: [name] Strength: 15mg  Qty: 2 boxes LOT: [id number] Exp.Date: 04/03/2022  Dosing instructions: Take 1 tablet by mouth daily

## 2020-10-18 NOTE — Telephone Encounter (Signed)
Ok to provide sample of rinvoq.

## 2020-10-18 NOTE — Telephone Encounter (Signed)
LMOM ok to pick up samples

## 2020-10-19 ENCOUNTER — Encounter: Payer: Self-pay | Admitting: Physical Therapy

## 2020-10-19 NOTE — Therapy (Signed)
[facility] [address] [place], [place], [address] Phone: [phone]   Fax:  [phone]  Physical Therapy Treatment  Patient Details  Name: Sherri [name] MRN: [id number] Date of Birth: [birth year] Referring Provider (PT): Eunice Blase   Encounter Date: 10/18/2020   PT End of Session - 10/19/20 1115    Visit Number 3    Number of Visits 12    Date for PT Re-Evaluation 11/23/20    Authorization Type UHC Medicare    PT Start Time 1215    PT Stop Time 1255    PT Time Calculation (min) 40 min    Activity Tolerance Patient tolerated treatment well    Behavior During Therapy [facility] for tasks assessed/performed           Past Medical History:  Diagnosis Date  . Anxiety    takes Valium daily as needed  . Basal cell carcinoma 01/21/1988   left nostril (MOHS), sup-right calf (CX35FU)  . Basal cell carcinoma 03/22/1996   right calf (CX35FU)  . Basal cell carcinoma 03/31/1995   left wing nose  . Bruises easily    d/t meds  . Cancer ([place])    basal cell ca, in situ- uterine   . Cataracts, bilateral    removed bilateral  . Chronic back pain   . Dizziness    r/t to meds  . GERD (gastroesophageal reflux disease)    no meds on a regular basis but will take Tums if needed  . Headache(784.0)    r/t neck issues  . History of bronchitis 6-2yrs ago  . History of colon polyps    benign  . Hyperlipidemia    takes Atorvastatin on Mondays and Fridays  . Hypertension    takes Ramipril daily  . Joint pain   . Joint swelling   . Multiple sclerosis ([place])   . Neuromuscular disorder ([place])    Dr. Park Liter- [facility], follows M.S.  . Nocturia   . PONV (postoperative nausea and vomiting)    trouble urinating after surgery in 2014  . Postoperative nausea and vomiting 01/11/2019  . Rheumatoid arthritis ([place])    Dr Jani Gravel weekly, RA- hands- knees- feet   . Rheumatoid arthritis(714.0)    Dr Estanislado Pandy takes [name]  daily  . Right wrist fracture   . Skin cancer   . Squamous cell carcinoma of skin 11/02/2001   in situ-right knee (cx71fu)  . Squamous cell carcinoma of skin 11/12/2011   in situ-left forearm (CX35FU), in situ-left foot (CX35FU)  . Squamous cell carcinoma of skin 01/22/2012   in situ-left lower forearm (txpbx)  . Squamous cell carcinoma of skin 11/17/2012   right shin (txpbx)  . Squamous cell carcinoma of skin 10/28/2013   in situ-right shoulder (CX35FU)  . Squamous cell carcinoma of skin 04/28/2014   well diff-right shin (txpbx)  . Squamous cell carcinoma of skin 11/02/2014   in situ-Left shin (txpbx)  . Squamous cell carcinoma of skin 12/15/2014   in situ-Left hand (txpbx), bowens-left side chest (txpbx)  . Squamous cell carcinoma of skin 05/09/2015   in situ-left hand (txpbx)   . Squamous cell carcinoma of skin 05/07/2016   in situ-left inner shin,ant, in situ-left inner shin, post, in situ-left outer forearm, in situ-right knuckle  . Squamous cell carcinoma of skin 03/1302018   in situ-left shoulder (txpbx), in situ-right inner shin (txpbx), in situ-top of left foot (txpbx), KA- left forearm (txpbx)  . Squamous cell carcinoma of skin  12/02/2016   in situ-left inner shin (txpbx)  . Squamous cell carcinoma of skin 03/04/2017   in situ-left outer sup, shin (txpbx), in situ- right 2nd knuckle finger (txpbx), in situ- Left wrist (txpbx)  . Squamous cell carcinoma of skin 04/06/2018   in situ-above left knee inner (txpbx)  . Squamous cell carcinoma of skin 04/21/2018   in situ-right top hand (txpbx)  . Squamous cell carcinoma of skin 07/08/2018   in situ-right lower inner shin (txpbx)  . Squamous cell carcinoma of skin 04/27/2019   in situ- Left neck(CX35FU), In situ- right neck (CX35FU)  . Urinary retention    sees Dr.Wrenn about 2 times a yr    Past Surgical History:  Procedure Laterality Date  . ABDOMINAL HYSTERECTOMY     1985  . APPENDECTOMY     with TAH  . BACK  SURGERY     several  . BREAST BIOPSY Right    Results were negative  . BROW LIFT Bilateral 10/02/2016   Procedure: BILATERAL LOWER LID BLEPHAROPLASTY;  Surgeon: Clista Bernhardt, MD;  Location: [facility];  Service: Plastics;  Laterality: Bilateral;  . CARPAL TUNNEL RELEASE Right   . cataracts    . CERVICAL FUSION     Dr Joya Salm  . CERVICAL FUSION  12/31/2012   Dr Joya Salm  . CHEST TUBE INSERTION     for traumatic Pneumothorax  . COLONOSCOPY  07/16/2010   normal   . COLONOSCOPY W/ POLYPECTOMY  1997   negative since; Dr Deatra Ina  . ESOPHAGOGASTRODUODENOSCOPY  07/16/2010   normal  . eye lid raise    . EYE SURGERY Bilateral    cataracts removed - /w IOL  . JOINT REPLACEMENT    . LUMBAR FUSION     Dr Joya Salm  . NASAL SINUS SURGERY    . POSTERIOR CERVICAL FUSION/FORAMINOTOMY N/A 12/31/2012   Procedure: POSTERIOR LATERAL CERVICAL FUSION/FORAMINOTOMY LEVEL 1 CERVICAL THREE-FOUR WITH LATERAL MASS SCREWS;  Surgeon: Floyce Stakes, MD;  Location: [facility];  Service: Neurosurgery;  Laterality: N/A;  . PTOSIS REPAIR Bilateral 10/02/2016   Procedure: INTERNAL PTOSIS REPAIR;  Surgeon: Clista Bernhardt, MD;  Location: [facility];  Service: Plastics;  Laterality: Bilateral;  . SKIN BIOPSY    . THYROID SURGERY     R lobe removed- 1972, has grown back - CT last done- 2018  . TOTAL HIP ARTHROPLASTY  12/22/2011   Procedure: TOTAL HIP ARTHROPLASTY;  Surgeon: Kerin Salen, MD;  Location: [facility];  Service: Orthopedics;  Laterality: Left;  . TOTAL SHOULDER ARTHROPLASTY    . TUBAL LIGATION    . UPPER GASTROINTESTINAL ENDOSCOPY  2012   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    There were no vitals filed for this visit.   Subjective Assessment - 10/19/20 1114    Subjective Pt states continued soreness, and difficulty sleeping.    Currently in Pain? Yes    Pain Score 7     Pain Location Back    Pain Orientation Right    Pain Descriptors / Indicators Sore;Spasm;Tightness    Pain Type Acute pain    Pain Onset  1 to 4 weeks ago    Pain Frequency Intermittent                             [facility] Adult PT Treatment/Exercise - 10/19/20 0001      Lumbar Exercises: Stretches   Other Lumbar Stretch Exercise standing QL  stretch 30 sec x 3;      Lumbar Exercises: Standing   Row 20 reps    Theraband Level (Row) Level 3 (Green)    Other Standing Lumbar Exercises Wall angels x 10(modified position)      Manual Therapy   Manual therapy comments skilled palpation and monitoring of soft tissue with dry needling.    Joint Mobilization Thoracic PA mobs, and UPA on R;    Soft tissue mobilization DTM and TPR to thoracic paraspinals and rhomoboid.            Trigger Point Dry Needling - 10/19/20 0001    Consent Given? Yes    Education Handout Provided Previously provided    Muscles Treated Back/Hip Thoracic multifidi    Rhomboids Response Palpable increased muscle length   R   Thoracic multifidi response Palpable increased muscle length   R                 PT Short Term Goals - 10/12/20 1457      PT SHORT TERM GOAL #1   Title Pt to be independent with initial HEP    Time 2    Period Weeks    Status New    Target Date 10/26/20             PT Long Term Goals - 10/12/20 1457      PT LONG TERM GOAL #1   Title Pt to be independent with final HEP    Time 6    Period Weeks    Status New    Target Date 11/23/20      PT LONG TERM GOAL #2   Title Pt to report decreased pain in R thoracic region to 0-2/10 with activity    Time 6    Period Weeks    Status New    Target Date 11/23/20      PT LONG TERM GOAL #3   Title Pt to demo soft tissue limitations to be WNL in thoraic region,  to improve pain and abilityfor functional activity    Time 6    Period Weeks    Status New    Target Date 11/23/20                 Plan - 10/19/20 1118    Clinical Impression Statement Pt most painful place at medial scap border. Pt with little pain in thoracic spine, but  mostly in surrounding muscultrue. Continued manual tissue release as well as DN for pain. Pt to benefit from continued care.    Personal Factors and Comorbidities Comorbidity 1    Comorbidities RA, OP, MS, Skin CA    Examination-Activity Limitations Reach Overhead;Bend;Sleep;Carry;Stand;Lift;Transfers;Locomotion Level;Dressing    Examination-Participation Restrictions Meal Prep;Cleaning;Community Activity;Driving;Shop;[employer]    Stability/Clinical Decision Making Stable/Uncomplicated    Rehab Potential Good    PT Frequency 2x / week    PT Duration 6 weeks    PT Treatment/Interventions ADLs/Self Care Home Management;Canalith Repostioning;Cryotherapy;Electrical Stimulation;Iontophoresis 4mg /ml Dexamethasone;Moist Heat;Traction;Ultrasound;DME Instruction;Gait training;Neuromuscular re-education;Therapeutic exercise;Therapeutic activities;Functional mobility training;Patient/family education;Orthotic Fit/Training;Manual techniques;Taping;Dry needling;Passive range of motion;Visual/perceptual remediation/compensation;Spinal Manipulations;Joint Manipulations    PT Home Exercise Plan Mid back and cross arm shoulder stretch, will issue HEP with handout next session.    Consulted and Agree with Plan of Care Patient           Patient will benefit from skilled therapeutic intervention in order to improve the following deficits and impairments:  Decreased range of motion,Increased muscle spasms,Decreased activity tolerance,Pain,Impaired  flexibility,Decreased mobility,Decreased strength  Visit Diagnosis: Pain in thoracic spine  Other muscle spasm     Problem List Patient Active Problem List   Diagnosis Date Noted  . Restless leg 02/02/2020  . Bilateral leg edema 01/11/2020  . Cervical radiculopathy 06/28/2019  . Carpal tunnel syndrome on both sides 06/28/2019  . Right sided sciatica 04/14/2018  . Trochanteric bursitis of right hip 04/14/2018  . S/P cervical spinal fusion 11/10/2017   . Dysphagia 10/26/2017  . Gastroesophageal reflux disease 08/06/2017  . Easy bruising 08/06/2017  . Mid back pain 03/26/2017  . Submandibular gland swelling 01/08/2017  . High risk medication use 09/02/2016  . Piriformis muscle pain 09/02/2016  . Right hip pain 04/08/2016  . Right knee pain 02/25/2016  . Numbness 11/14/2014  . Biceps tendon tear 10/10/2014  . Insomnia 10/10/2014  . Gait disturbance 06/13/2014  . OP (osteoporosis) 01/18/2014  . Other long term (current) drug therapy 01/18/2014  . Hoarseness 12/29/2013  . 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 Personal history of colonic polyps 07/03/2010  . Nocturia 05/28/2010  . THYROID NODULE 05/17/2008  . Multiple sclerosis ([place]) 05/17/2008  . Other fatigue 03/30/2007  . Hyperlipidemia 08/20/2006  . Essential hypertension 08/20/2006  . Rheumatoid arthritis ([place]) 08/20/2006  . CERVICAL CANCER, HX OF 08/20/2006  . GASTRIC ULCER, HX OF 08/20/2006    Lyndee Hensen, PT, DPT 11:29 AM  10/19/20    [facility] [place], [place], [address] Phone: [phone]   Fax:  [phone]  Name: MERRIDY PASCOE MRN: [id number] Date of Birth: 18-Aug-1940

## 2020-10-25 ENCOUNTER — Ambulatory Visit: Payer: Medicare Other | Admitting: Physical Therapy

## 2020-10-25 ENCOUNTER — Other Ambulatory Visit: Payer: Self-pay

## 2020-10-25 DIAGNOSIS — M546 Pain in thoracic spine: Secondary | ICD-10-CM

## 2020-10-25 DIAGNOSIS — M62838 Other muscle spasm: Secondary | ICD-10-CM | POA: Diagnosis not present

## 2020-10-26 ENCOUNTER — Encounter: Payer: Medicare Other | Admitting: Physical Therapy

## 2020-10-29 ENCOUNTER — Encounter: Payer: Medicare Other | Admitting: Physical Therapy

## 2020-10-29 ENCOUNTER — Encounter: Payer: Self-pay | Admitting: Physical Therapy

## 2020-10-29 NOTE — Therapy (Addendum)
[facility] [address] [place], [place], [address] Phone: [phone]   Fax:  [phone]  Physical Therapy Treatment  Patient Details  Name: Sherri [name] MRN: [id number] Date of Birth: 10-12-1940 Referring Provider (PT): Eunice Blase   Encounter Date: 10/25/2020   PT End of Session - 10/29/20 1416     Visit Number 4    Number of Visits 12    Date for PT Re-Evaluation 11/23/20    Authorization Type [employer] Medicare    PT Start Time 1516    PT Stop Time 1556    PT Time Calculation (min) 40 min    Activity Tolerance Patient tolerated treatment well    Behavior During Therapy [facility] for tasks assessed/performed             Past Medical History:  Diagnosis Date   Anxiety    takes Valium daily as needed   Basal cell carcinoma 01/21/1988   left nostril (MOHS), sup-right calf (CX35FU)   Basal cell carcinoma 03/22/1996   right calf (CX35FU)   Basal cell carcinoma 03/31/1995   left wing nose   Bruises easily    d/t meds   Cancer ([place])    basal cell ca, in situ- uterine    Cataracts, bilateral    removed bilateral   Chronic back pain    Dizziness    r/t to meds   GERD (gastroesophageal reflux disease)    no meds on a regular basis but will take Tums if needed   Headache(784.0)    r/t neck issues   History of bronchitis 6-65yr ago   History of colon polyps    benign   Hyperlipidemia    takes Atorvastatin on Mondays and Fridays   Hypertension    takes Ramipril daily   Joint pain    Joint swelling    Multiple sclerosis ([facility]    Neuromuscular disorder ([facility]    Dr. RPark Liter [facility], follows M.S.   Nocturia    PONV (postoperative nausea and vomiting)    trouble urinating after surgery in 2014   Postoperative nausea and vomiting 01/11/2019   Rheumatoid arthritis ([place]    Dr VJani Gravelweekly, RA- hands- knees- feet    Rheumatoid arthritis(714.0)    Dr DEstanislado Pandytakes XMorrie Sheldondaily   Right wrist  fracture    Skin cancer    Squamous cell carcinoma of skin 11/02/2001   in situ-right knee (cx360f   Squamous cell carcinoma of skin 11/12/2011   in situ-left forearm (CX35FU), in situ-left foot (CX35FU)   Squamous cell carcinoma of skin 01/22/2012   in situ-left lower forearm (txpbx)   Squamous cell carcinoma of skin 11/17/2012   right shin (txpbx)   Squamous cell carcinoma of skin 10/28/2013   in situ-right shoulder (CX35FU)   Squamous cell carcinoma of skin 04/28/2014   well diff-right shin (txpbx)   Squamous cell carcinoma of skin 11/02/2014   in situ-Left shin (txpbx)   Squamous cell carcinoma of skin 12/15/2014   in situ-Left hand (txpbx), bowens-left side chest (txpbx)   Squamous cell carcinoma of skin 05/09/2015   in situ-left hand (txpbx)    Squamous cell carcinoma of skin 05/07/2016   in situ-left inner shin,ant, in situ-left inner shin, post, in situ-left outer forearm, in situ-right knuckle   Squamous cell carcinoma of skin 03/1302018   in situ-left shoulder (txpbx), in situ-right inner shin (txpbx), in situ-top of left foot (txpbx), KA- left forearm (txpbx)   Squamous cell  carcinoma of skin 12/02/2016   in situ-left inner shin (txpbx)   Squamous cell carcinoma of skin 03/04/2017   in situ-left outer sup, shin (txpbx), in situ- right 2nd knuckle finger (txpbx), in situ- Left wrist (txpbx)   Squamous cell carcinoma of skin 04/06/2018   in situ-above left knee inner (txpbx)   Squamous cell carcinoma of skin 04/21/2018   in situ-right top hand (txpbx)   Squamous cell carcinoma of skin 07/08/2018   in situ-right lower inner shin (txpbx)   Squamous cell carcinoma of skin 04/27/2019   in situ- Left neck(CX35FU), In situ- right neck (CX35FU)   Urinary retention    sees Dr.Wrenn about 2 times a yr    Past Surgical History:  Procedure Laterality Date   [place]     with TAH   BACK SURGERY     several   BREAST BIOPSY Right     Results were negative   BROW LIFT Bilateral 10/02/2016   Procedure: BILATERAL LOWER LID BLEPHAROPLASTY;  Surgeon: Clista Bernhardt, MD;  Location: [facility];  Service: Plastics;  Laterality: Bilateral;   CARPAL TUNNEL RELEASE Right    cataracts     CERVICAL FUSION     Dr Joya Salm   CERVICAL FUSION  12/31/2012   Dr Joya Salm   CHEST TUBE INSERTION     for traumatic Pneumothorax   COLONOSCOPY  07/16/2010   normal    COLONOSCOPY W/ POLYPECTOMY  1997   negative since; Dr Deatra Ina   ESOPHAGOGASTRODUODENOSCOPY  07/16/2010   normal   eye lid raise     EYE SURGERY Bilateral    cataracts removed - /w IOL   JOINT REPLACEMENT     LUMBAR FUSION     Dr Joya Salm   NASAL SINUS SURGERY     POSTERIOR CERVICAL FUSION/FORAMINOTOMY N/A 12/31/2012   Procedure: POSTERIOR LATERAL CERVICAL FUSION/FORAMINOTOMY LEVEL 1 CERVICAL THREE-FOUR WITH LATERAL MASS SCREWS;  Surgeon: Floyce Stakes, MD;  Location: [facility];  Service: Neurosurgery;  Laterality: N/A;   PTOSIS REPAIR Bilateral 10/02/2016   Procedure: INTERNAL PTOSIS REPAIR;  Surgeon: Clista Bernhardt, MD;  Location: [facility];  Service: Plastics;  Laterality: Bilateral;   SKIN BIOPSY     THYROID SURGERY     R lobe removed- 1972, has grown back - CT last done- 2018   TOTAL HIP ARTHROPLASTY  12/22/2011   Procedure: TOTAL HIP ARTHROPLASTY;  Surgeon: Kerin Salen, MD;  Location: [facility];  Service: Orthopedics;  Laterality: Left;   TOTAL SHOULDER ARTHROPLASTY     TUBAL LIGATION     UPPER GASTROINTESTINAL ENDOSCOPY  2012   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    There were no vitals filed for this visit.   Subjective Assessment - 10/29/20 1411     Subjective Pt reports doing better at start of session today with decreased pain in R shoulder blade region.    Currently in Pain? Yes    Pain Score 3     Pain Location Back    Pain Orientation Right    Pain Descriptors / Indicators Aching;Sore    Pain Type Acute pain    Pain Onset 1 to 4 weeks ago    Pain Frequency  Intermittent                               [facility] Adult PT Treatment/Exercise - 10/29/20 0001  Lumbar Exercises: Standing   Row 20 reps    Theraband Level (Row) Level 3 (Green)    Other Standing Lumbar Exercises Wall angels x 15(modified position)    Other Standing Lumbar Exercises Wall push ups x 10;      Lumbar Exercises: Quadruped   Madcat/Old Horse 15 reps      Manual Therapy   Manual therapy comments skilled palpation and monitoring of soft tissue with dry needling.    Joint Mobilization Thoracic PA mobs, and UPA on R;    Soft tissue mobilization DTM and TPR to thoracic paraspinals and rhomoboid.              Trigger Point Dry Needling - 10/29/20 0001     Consent Given? Yes    Education Handout Provided Previously provided    Muscles Treated Back/Hip Thoracic multifidi    Rhomboids Response Palpable increased muscle length   R   Erector spinae Response Palpable increased muscle length   R mid thoracic   Thoracic multifidi response Palpable increased muscle length   R                   PT Short Term Goals - 10/29/20 1419       PT SHORT TERM GOAL #1   Title Pt to be independent with initial HEP    Time 2    Period Weeks    Status Achieved    Target Date 10/26/20               PT Long Term Goals - 10/12/20 1457       PT LONG TERM GOAL #1   Title Pt to be independent with final HEP    Time 6    Period Weeks    Status New    Target Date 11/23/20      PT LONG TERM GOAL #2   Title Pt to report decreased pain in R thoracic region to 0-2/10 with activity    Time 6    Period Weeks    Status New    Target Date 11/23/20      PT LONG TERM GOAL #3   Title Pt to demo soft tissue limitations to be WNL in thoraic region,  to improve pain and abilityfor functional activity    Time 6    Period Weeks    Status New    Target Date 11/23/20                   Plan - 10/29/20 1422     Clinical Impression  Statement Continued manual for pain, and ther ex for strengthening for scapular muscles.Pt with increased pain in Rhomboid, medial scapular region on R after light ther ex with shoulder ROM today.  Pt continues to have bothersome pain, in R rhomboid, mid trap region, as well as tenderness in mid thoracic spine with mobilization. Pt to be seen next week, will refer back to MD if pain does not improve. She has not had significant pain relief thus far.    Personal Factors and Comorbidities Comorbidity 1    Comorbidities RA, OP, MS, Skin CA    Examination-Activity Limitations Reach Overhead;Bend;Sleep;Carry;Stand;Lift;Transfers;Locomotion Level;Dressing    Examination-Participation Restrictions Meal Prep;Cleaning;Community Activity;Driving;Shop;[employer]    Stability/Clinical Decision Making Stable/Uncomplicated    Rehab Potential Good    PT Frequency 2x / week    PT Duration 6 weeks    PT Treatment/Interventions ADLs/Self Care Home Management;Canalith Repostioning;Cryotherapy;Electrical Stimulation;Iontophoresis 98m/ml Dexamethasone;Moist Heat;Traction;Ultrasound;DME  Instruction;Gait training;Neuromuscular re-education;Therapeutic exercise;Therapeutic activities;Functional mobility training;Patient/family education;Orthotic Fit/Training;Manual techniques;Taping;Dry needling;Passive range of motion;Visual/perceptual remediation/compensation;Spinal Manipulations;Joint Manipulations    PT Home Exercise Plan Mid back and cross arm shoulder stretch, will issue HEP with handout next session.    Consulted and Agree with Plan of Care Patient             Patient will benefit from skilled therapeutic intervention in order to improve the following deficits and impairments:  Decreased range of motion,Increased muscle spasms,Decreased activity tolerance,Pain,Impaired flexibility,Decreased mobility,Decreased strength  Visit Diagnosis: Pain in thoracic spine  Other muscle spasm     Problem  List Patient Active Problem List   Diagnosis Date Noted   Restless leg 02/02/2020   Bilateral leg edema 01/11/2020   Cervical radiculopathy 06/28/2019   Carpal tunnel syndrome on both sides 06/28/2019   Right sided sciatica 04/14/2018   Trochanteric bursitis of right hip 04/14/2018   S/P cervical spinal fusion 11/10/2017   Dysphagia 10/26/2017   Gastroesophageal reflux disease 08/06/2017   Easy bruising 08/06/2017   Mid back pain 03/26/2017   Submandibular gland swelling 01/08/2017   High risk medication use 09/02/2016   Piriformis muscle pain 09/02/2016   Right hip pain 04/08/2016   Right knee pain 02/25/2016   Numbness 11/14/2014   Biceps tendon tear 10/10/2014   Insomnia 10/10/2014   Gait disturbance 06/13/2014   OP (osteoporosis) 01/18/2014   Other long term (current) drug therapy 01/18/2014   Hoarseness 12/29/2013   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 01/01/2013   Personal history of colonic polyps 07/03/2010   Nocturia 05/28/2010   THYROID NODULE 05/17/2008   Multiple sclerosis ([place]) 05/17/2008   Other fatigue 03/30/2007   Hyperlipidemia 08/20/2006   Essential hypertension 08/20/2006   Rheumatoid arthritis ([place]) 08/20/2006   CERVICAL CANCER, HX OF 08/20/2006   GASTRIC ULCER, HX OF 08/20/2006    Lyndee Hensen, PT, DPT 2:27 PM  10/29/20    [facility] [address] [place], [place], [address] Phone: [phone]   Fax:  [phone]  Name: CYTHINA MICKELSEN MRN: [id number] Date of Birth: Oct 31, 1940  PHYSICAL THERAPY DISCHARGE SUMMARY  Visits from Start of Care:4 Plan: Patient agrees to discharge.  Patient goals were partially met. Patient is being discharged due to - referred back to MD for ongoing pain.    Lyndee Hensen, PT, DPT 2:33 PM  05/23/21

## 2020-10-30 ENCOUNTER — Other Ambulatory Visit: Payer: Self-pay | Admitting: Physician Assistant

## 2020-11-01 ENCOUNTER — Encounter: Payer: Medicare Other | Admitting: Physical Therapy

## 2020-11-04 ENCOUNTER — Other Ambulatory Visit: Payer: Self-pay | Admitting: Rheumatology

## 2020-11-05 NOTE — Telephone Encounter (Signed)
Next Visit: 11/28/2020  Last Visit: 09/04/2020  Last Fill: 06/18/2020  Dx: Rheumatoid arthritis involving multiple sites with positive rheumatoid factor  Current Dose per office note on 09/04/2020: prednisone 5 mg 1 tablet by mouth daily  Okay to refill per Dr. Estanislado Pandy

## 2020-11-12 ENCOUNTER — Telehealth: Payer: Self-pay

## 2020-11-12 NOTE — Telephone Encounter (Signed)
Patient requested a return call to let her know if she can stop by the office today to pick up a sample of [name].

## 2020-11-13 ENCOUNTER — Ambulatory Visit: Payer: Medicare Other | Admitting: Family Medicine

## 2020-11-13 NOTE — Telephone Encounter (Signed)
Medication Samples have been provided to the patient.  Drug name: [name]       Strength: 15 mg        Qty: 2  LOT: [id number] Exp.Date: 02/23/2022 & 04/03/2022  Dosing instructions: Take one tablet by mouth once daily.    Patient states she is having increased bruising on her arms and she has noticed increased bruising on her shins. Patient has a follow up appointment on 11/28/2020. She would like to know if she should be seen sooner.

## 2020-11-13 NOTE — Telephone Encounter (Signed)
Sample of [name] labeled in cabinet for patient to pick up. Called patient but unable to reach. Left VM to advise.  Not yet logged out in sample book.  Knox Saliva, PharmD, MPH Clinical Pharmacist (Rheumatology and Pulmonology)

## 2020-11-13 NOTE — Telephone Encounter (Signed)
Patient may come in to get CBC may come in to get CBC. ( Check platelets)

## 2020-11-13 NOTE — Telephone Encounter (Signed)
Attempted to contact the patient and left message for patient to call the office.  

## 2020-11-14 ENCOUNTER — Other Ambulatory Visit: Payer: Self-pay | Admitting: Physician Assistant

## 2020-11-14 ENCOUNTER — Other Ambulatory Visit: Payer: Self-pay

## 2020-11-14 DIAGNOSIS — Z79899 Other long term (current) drug therapy: Secondary | ICD-10-CM | POA: Diagnosis not present

## 2020-11-14 DIAGNOSIS — M0579 Rheumatoid arthritis with rheumatoid factor of multiple sites without organ or systems involvement: Secondary | ICD-10-CM | POA: Diagnosis not present

## 2020-11-14 LAB — CBC WITH DIFFERENTIAL/PLATELET
Absolute Monocytes: 485 cells/uL (ref 200–950)
Basophils Absolute: 19 cells/uL (ref 0–200)
Basophils Relative: 0.5 %
Eosinophils Absolute: 11 cells/uL — ABNORMAL LOW (ref 15–500)
Eosinophils Relative: 0.3 %
HCT: 33.7 % — ABNORMAL LOW (ref 35.0–45.0)
Hemoglobin: 11.5 g/dL — ABNORMAL LOW (ref 11.7–15.5)
Lymphs Abs: 562 cells/uL — ABNORMAL LOW (ref 850–3900)
MCH: 36.4 pg — ABNORMAL HIGH (ref 27.0–33.0)
MCHC: 34.1 g/dL (ref 32.0–36.0)
MCV: 106.6 fL — ABNORMAL HIGH (ref 80.0–100.0)
MPV: 9 fL (ref 7.5–12.5)
Monocytes Relative: 13.1 %
Neutro Abs: 2623 cells/uL (ref 1500–7800)
Neutrophils Relative %: 70.9 %
Platelets: 290 10*3/uL (ref 140–400)
RBC: 3.16 10*6/uL — ABNORMAL LOW (ref 3.80–5.10)
RDW: 14.5 % (ref 11.0–15.0)
Total Lymphocyte: 15.2 %
WBC: 3.7 10*3/uL — ABNORMAL LOW (ref 3.8–10.8)

## 2020-11-14 NOTE — Progress Notes (Signed)
Office Visit Note  Patient: Sherri [name]             Date of Birth: November 02, 1940           MRN: [id number]             PCP: Binnie Rail, MD Referring: Eunice Blase, MD Visit Date: 11/28/2020 Occupation: @GUAROCC @  Subjective:  Medication management.   History of Present Illness: Sherri [name] is a 80 y.o. female with a history of rheumatoid arthritis, osteoarthritis and osteoporosis.  She states she continues to have some joint discomfort and intermittent joint swelling.  Overall she is doing much better on the current combination.  She has some discomfort in the right SI joint.  She has intermittent swelling in her hands.  Activities of Daily Living:  Patient reports morning stiffness for several hours.   Patient Reports nocturnal pain.  Difficulty dressing/grooming: Denies Difficulty climbing stairs: Reports Difficulty getting out of chair: Denies Difficulty using hands for taps, buttons, cutlery, and/or writing: Reports  Review of Systems  Constitutional:  Negative for fatigue.  HENT:  Negative for mouth sores, mouth dryness and nose dryness.   Eyes:  Positive for itching and dryness. Negative for pain and visual disturbance.  Respiratory:  Negative for cough, hemoptysis, shortness of breath and difficulty breathing.   Cardiovascular:  Positive for swelling in legs/feet. Negative for chest pain and palpitations.  Gastrointestinal:  Positive for constipation. Negative for abdominal pain, blood in stool and diarrhea.  Endocrine: Negative for increased urination.  Genitourinary:  Negative for painful urination.  Musculoskeletal:  Positive for joint pain, joint pain, joint swelling, myalgias, muscle weakness, morning stiffness, muscle tenderness and myalgias.  Skin:  Negative for color change, rash and redness.  Allergic/Immunologic: Negative for susceptible to infections.  Neurological:  Negative for dizziness, numbness, headaches, memory loss and weakness.   Hematological:  Negative for swollen glands.  Psychiatric/Behavioral:  Positive for sleep disturbance. Negative for confusion.    PMFS History:  Patient Active Problem List   Diagnosis Date Noted   Restless leg 02/02/2020   Bilateral leg edema 01/11/2020   Cervical radiculopathy 06/28/2019   Carpal tunnel syndrome on both sides 06/28/2019   Right sided sciatica 04/14/2018   Trochanteric bursitis of right hip 04/14/2018   S/P cervical spinal fusion 11/10/2017   Dysphagia 10/26/2017   Gastroesophageal reflux disease 08/06/2017   Easy bruising 08/06/2017   Mid back pain 03/26/2017   Submandibular gland swelling 01/08/2017   High risk medication use 09/02/2016   Piriformis muscle pain 09/02/2016   Right hip pain 04/08/2016   Right knee pain 02/25/2016   Numbness 11/14/2014   Biceps tendon tear 10/10/2014   Insomnia 10/10/2014   Gait disturbance 06/13/2014   OP (osteoporosis) 01/18/2014   Other long term (current) drug therapy 01/18/2014   Hoarseness 12/29/2013   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 01/01/2013   Personal history of colonic polyps 07/03/2010   Nocturia 05/28/2010   THYROID NODULE 05/17/2008   Multiple sclerosis ([place]) 05/17/2008   Other fatigue 03/30/2007   Hyperlipidemia 08/20/2006   Essential hypertension 08/20/2006   Rheumatoid arthritis ([place]) 08/20/2006   CERVICAL CANCER, HX OF 08/20/2006   GASTRIC ULCER, HX OF 08/20/2006    Past Medical History:  Diagnosis Date   Anxiety    takes Valium daily as needed   Basal cell carcinoma 01/21/1988   left nostril (MOHS), sup-right calf (CX35FU)   Basal cell carcinoma 03/22/1996   right calf (CX35FU)  Basal cell carcinoma 03/31/1995   left wing nose   Bruises easily    d/t meds   Cancer ([facility])    basal cell ca, in situ- uterine    Cataracts, bilateral    removed bilateral   Chronic back pain    Dizziness    r/t to meds   GERD (gastroesophageal reflux disease)    no meds on a regular basis but will  take Tums if needed   Headache(784.0)    r/t neck issues   History of bronchitis 6-74yrs ago   History of colon polyps    benign   Hyperlipidemia    takes Atorvastatin on Mondays and Fridays   Hypertension    takes Ramipril daily   Joint pain    Joint swelling    Multiple sclerosis ([place])    Neuromuscular disorder ([place])    Dr. Park Liter- [facility], follows M.S.   Nocturia    PONV (postoperative nausea and vomiting)    trouble urinating after surgery in 2014   Postoperative nausea and vomiting 01/11/2019   Rheumatoid arthritis ([place])    Dr Jani Gravel weekly, RA- hands- knees- feet    Rheumatoid arthritis(714.0)    Dr Estanislado Pandy takes [name] daily   Right wrist fracture    Skin cancer    Squamous cell carcinoma of skin 11/02/2001   in situ-right knee (cx70fu)   Squamous cell carcinoma of skin 11/12/2011   in situ-left forearm (CX35FU), in situ-left foot (CX35FU)   Squamous cell carcinoma of skin 01/22/2012   in situ-left lower forearm (txpbx)   Squamous cell carcinoma of skin 11/17/2012   right shin (txpbx)   Squamous cell carcinoma of skin 10/28/2013   in situ-right shoulder (CX35FU)   Squamous cell carcinoma of skin 04/28/2014   well diff-right shin (txpbx)   Squamous cell carcinoma of skin 11/02/2014   in situ-Left shin (txpbx)   Squamous cell carcinoma of skin 12/15/2014   in situ-Left hand (txpbx), bowens-left side chest (txpbx)   Squamous cell carcinoma of skin 05/09/2015   in situ-left hand (txpbx)    Squamous cell carcinoma of skin 05/07/2016   in situ-left inner shin,ant, in situ-left inner shin, post, in situ-left outer forearm, in situ-right knuckle   Squamous cell carcinoma of skin 03/1302018   in situ-left shoulder (txpbx), in situ-right inner shin (txpbx), in situ-top of left foot (txpbx), KA- left forearm (txpbx)   Squamous cell carcinoma of skin 12/02/2016   in situ-left inner shin (txpbx)   Squamous cell carcinoma of skin 03/04/2017   in  situ-left outer sup, shin (txpbx), in situ- right 2nd knuckle finger (txpbx), in situ- Left wrist (txpbx)   Squamous cell carcinoma of skin 04/06/2018   in situ-above left knee inner (txpbx)   Squamous cell carcinoma of skin 04/21/2018   in situ-right top hand (txpbx)   Squamous cell carcinoma of skin 07/08/2018   in situ-right lower inner shin (txpbx)   Squamous cell carcinoma of skin 04/27/2019   in situ- Left neck(CX35FU), In situ- right neck (CX35FU)   Urinary retention    sees Dr.Wrenn about 2 times a yr    Family History  Problem Relation Age of Onset   Cancer Mother        pancreatic   Diabetes Mother    Pancreatic cancer Mother    Heart disease Father        Rheumatic   Cancer Father        ? stomach   Stomach cancer  Father    Cancer Sister        stomach   Stomach cancer Sister    Cancer Maternal Aunt        X 4; ? primary   Heart disease Paternal Aunt    Cancer Maternal Grandmother        cervical   Colon cancer Other        Aunts   Multiple sclerosis Daughter    Hypertension Neg Hx    Stroke Neg Hx    Esophageal cancer Neg Hx    Rectal cancer Neg Hx    Past Surgical History:  Procedure Laterality Date   ABDOMINAL HYSTERECTOMY     1985   APPENDECTOMY     with TAH   BACK SURGERY     several   BREAST BIOPSY Right    Results were negative   BROW LIFT Bilateral 10/02/2016   Procedure: BILATERAL LOWER LID BLEPHAROPLASTY;  Surgeon: Clista Bernhardt, MD;  Location: [facility];  Service: Plastics;  Laterality: Bilateral;   CARPAL TUNNEL RELEASE Right    cataracts     CERVICAL FUSION     Dr Joya Salm   CERVICAL FUSION  12/31/2012   Dr Joya Salm   CHEST TUBE INSERTION     for traumatic Pneumothorax   COLONOSCOPY  07/16/2010   normal    COLONOSCOPY W/ POLYPECTOMY  1997   negative since; Dr Deatra Ina   ESOPHAGOGASTRODUODENOSCOPY  07/16/2010   normal   eye lid raise     EYE SURGERY Bilateral    cataracts removed - /w IOL   JOINT REPLACEMENT     LUMBAR FUSION     Dr  Joya Salm   NASAL SINUS SURGERY     POSTERIOR CERVICAL FUSION/FORAMINOTOMY N/A 12/31/2012   Procedure: POSTERIOR LATERAL CERVICAL FUSION/FORAMINOTOMY LEVEL 1 CERVICAL THREE-FOUR WITH LATERAL MASS SCREWS;  Surgeon: Floyce Stakes, MD;  Location: [facility];  Service: Neurosurgery;  Laterality: N/A;   PTOSIS REPAIR Bilateral 10/02/2016   Procedure: INTERNAL PTOSIS REPAIR;  Surgeon: Clista Bernhardt, MD;  Location: [facility];  Service: Plastics;  Laterality: Bilateral;   SKIN BIOPSY     THYROID SURGERY     R lobe removed- 1972, has grown back - CT last done- 2018   TOTAL HIP ARTHROPLASTY  12/22/2011   Procedure: TOTAL HIP ARTHROPLASTY;  Surgeon: Kerin Salen, MD;  Location: [facility];  Service: Orthopedics;  Laterality: Left;   TOTAL SHOULDER ARTHROPLASTY     TUBAL LIGATION     UPPER GASTROINTESTINAL ENDOSCOPY  2012   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   Social History   Social History Narrative   Not on file   Immunization History  Administered Date(s) Administered   Fluad Quad(high Dose 65+) 02/02/2020   Influenza Whole 02/22/2007, 02/08/2013   Influenza, High Dose Seasonal PF 02/04/2015, 02/18/2017, 02/21/2018, 01/16/2019   Influenza-Unspecified 01/24/2014, 01/25/2016, 02/18/2017, 01/16/2019   PFIZER(Purple Top)SARS-COV-2 Vaccination 06/18/2019, 07/09/2019, 01/09/2020, 07/03/2020   Pneumococcal Conjugate-13 11/23/2012, 09/08/2014   Pneumococcal Polysaccharide-23 05/26/2006   Pneumococcal-Unspecified 12/28/2012   Td 05/28/2010   Zoster Recombinat (Shingrix) 02/10/2019, 04/14/2019   Zoster, Live 05/26/2009     Objective: Vital Signs: BP 119/77 (BP Location: Left Arm, Patient Position: Sitting, Cuff Size: Normal)   Pulse 69   Ht 5\' 3"  (1.6 m)   Wt 118 lb (53.5 kg)   BMI 20.90 kg/m    Physical Exam Vitals and nursing note reviewed.  Constitutional:      Appearance: She  is well-developed.  HENT:     Head: Normocephalic and atraumatic.  Eyes:     Conjunctiva/sclera: Conjunctivae  normal.  Cardiovascular:     Rate and Rhythm: Normal rate and regular rhythm.     Heart sounds: Normal heart sounds.  Pulmonary:     Effort: Pulmonary effort is normal.     Breath sounds: Normal breath sounds.  Abdominal:     General: Bowel sounds are normal.     Palpations: Abdomen is soft.  Musculoskeletal:     Cervical back: Normal range of motion.  Lymphadenopathy:     Cervical: No cervical adenopathy.  Skin:    General: Skin is warm and dry.     Capillary Refill: Capillary refill takes less than 2 seconds.  Neurological:     Mental Status: She is alert and oriented to person, place, and time.  Psychiatric:        Behavior: Behavior normal.     Musculoskeletal Exam: She had limited right lateral rotation.  Shoulder joints, elbow joints, wrist joints with good range of motion.  She had bilateral MCP, PIP and DIP thickening with synovitis and some of her MCPs and PIP joints.  She had good range of motion of her hip joints.  Left joint is replaced.  She had warmth on palpation of her right knee joint.  There is no tenderness over ankles or MTPs.  CDAI Exam: CDAI Score: 10.5  Patient Global: 2 mm; Provider Global: 3 mm Swollen: 5 ; Tender: 5  Joint Exam 11/28/2020      Right  Left  MCP 3  Swollen Tender     PIP 2     Swollen Tender  PIP 4  Swollen Tender  Swollen Tender  Knee  Swollen Tender        Investigation: No additional findings.  Imaging: No results found.  Recent Labs: Lab Results  Component Value Date   WBC 3.7 (L) 11/14/2020   HGB 11.5 (L) 11/14/2020   PLT 290 11/14/2020   NA 139 09/26/2020   K 4.7 09/26/2020   CL 104 09/26/2020   CO2 27 09/26/2020   GLUCOSE 74 09/26/2020   BUN 10 09/26/2020   CREATININE 0.97 (H) 09/26/2020   BILITOT 0.6 09/26/2020   ALKPHOS 29 (L) 04/10/2020   AST 27 09/26/2020   ALT 19 09/26/2020   PROT 6.4 09/26/2020   ALBUMIN 4.2 04/10/2020   CALCIUM 9.0 09/26/2020   GFRAA 65 09/26/2020   QFTBGOLDPLUS NEGATIVE  09/26/2020    Speciality Comments: Prior therapy includes: Health and safety inspector (cost), [name] (cost)   Procedures:  No procedures performed Allergies: Darvon [propoxyphene hcl], Meperidine hcl, Penicillins, Azathioprine, Sulfa antibiotics, Atorvastatin, Demerol [meperidine], Leflunomide, and Ezetimibe-simvastatin   Assessment / Plan:     Visit Diagnoses: Rheumatoid arthritis involving multiple sites with positive rheumatoid factor ([place]) - She previously had an inadequate response of xeljanz, oluminant. She developed weakness while taking imuran and discontinued arava due to intolerance.  She continues to have some synovitis in her joints as described above.  Although this is the best she has done in several years.  She discussed tapering prednisone but clinically she is not ready to taper prednisone due to ongoing synovitis.  I advised her to continue on the current treatment.  High risk medication use - [name] 15 mg 1 tablet by mouth daily, Methotrexate 0.6 ml sq injections once weekly, folic acid 1 mg p.o, prednisone 5 mg 1 tablet by mouth daily.  Labs obtained on November 14, 2020  showed neutropenia and anemia.  Creatinine was mildly elevated.  We will continue to monitor labs every 3 months.  She has been advised to discontinue [name] in case she develops an infection and resume once infection resolves.  Updated him information regarding immunization was also placed in the AVS.  Blackbox warning regarding increased risk of MACE was discussed.  Patient understands the risk and wants to continue on [name].  She states she has discussed the risks involved with the use of [name] with her family members including her daughter who is a Marine scientist.  Age-related osteoporosis without current pathological fracture - DEXA: 01/16/2020 T-score: -4.2. Hx of stress fractures. Most recent fracture: 07/31/20 nondisplaced proximal fifth metatarsal fracture, incomplete. prolia: 07/05/20.  We will schedule her for the next Prolia  injection.  Primary osteoarthritis of right hip-she had good range of motion in her hip joint without discomfort.  History of total hip replacement, left - Dr. Mayer Camel.  Doing well.  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-he had limited range of motion especially with the right lateral rotation.  DDD (degenerative disc disease), lumbar-she continues to have some lower back pain.  Stretching exercises were advised.  Closed fracture of right wrist with routine healing, subsequent encounter  Vitamin D deficiency-she is on vitamin D 2000 units daily.  Other fatigue  Primary insomnia  History of multiple sclerosis ([place]) - She is not a good candidate for TNF inhibitors.   History of hypertension-blood pressure is well controlled.  History of hyperlipidemia  History of depression  History of anxiety  Orders: No orders of the defined types were placed in this encounter.  No orders of the defined types were placed in this encounter.   Follow-Up Instructions: Return in about 3 months (around 02/28/2021) for Rheumatoid arthritis.   Bo Merino, MD  Note - This record has been created using Editor, commissioning.  Chart creation errors have been sought, but may not always  have been located. Such creation errors do not reflect on  the standard of medical care.

## 2020-11-14 NOTE — Telephone Encounter (Signed)
Next Visit: 11/28/2020   Last Visit: 09/04/2020   Last Fill: 09/04/2020  Dx: Rheumatoid arthritis involving multiple sites with positive rheumatoid factor   Current Dose per office note on 09/04/2020:Methotrexate 0.6 ml sq injections once weekly,  Labs: 09/26/2020 Creatinine is mildly elevated, most likely due to diuretic use.  White cellcount is low due to immunosuppressive therapy.  Rest the labs are stable.  Okay to refill MTX?

## 2020-11-14 NOTE — Telephone Encounter (Signed)
Patient advised  may come in to get CBC may come in to get CBC. ( Check platelets). Patient states she will come in today.

## 2020-11-15 NOTE — Progress Notes (Signed)
WBC count is better.  Hemoglobin is low and stable.  Lymphocyte count is low due to immunosuppression.

## 2020-11-20 ENCOUNTER — Ambulatory Visit: Payer: Medicare Other | Admitting: Family Medicine

## 2020-11-20 ENCOUNTER — Encounter: Payer: Self-pay | Admitting: Family Medicine

## 2020-11-20 VITALS — BP 130/68 | HR 67 | Ht 63.0 in | Wt 119.4 lb

## 2020-11-20 DIAGNOSIS — M0579 Rheumatoid arthritis with rheumatoid factor of multiple sites without organ or systems involvement: Secondary | ICD-10-CM | POA: Diagnosis not present

## 2020-11-20 DIAGNOSIS — R269 Unspecified abnormalities of gait and mobility: Secondary | ICD-10-CM

## 2020-11-20 DIAGNOSIS — G2581 Restless legs syndrome: Secondary | ICD-10-CM

## 2020-11-20 DIAGNOSIS — G35 Multiple sclerosis: Secondary | ICD-10-CM | POA: Diagnosis not present

## 2020-11-20 DIAGNOSIS — R5383 Other fatigue: Secondary | ICD-10-CM

## 2020-11-20 NOTE — Patient Instructions (Signed)
Below is our plan:  We will continue to monitor symptoms. Please let me know if twitching or dragging of the right leg worsens. Continue close follow up with PCP and rheumatology.   Please make sure you are staying well hydrated. I recommend 50-60 ounces daily. Well balanced diet and regular exercise encouraged. Consistent sleep schedule with 6-8 hours recommended.   Please continue follow up with care team as directed.   Follow up with Dr Felecia Shelling in 1 year   You may receive a survey regarding today's visit. I encourage you to leave honest feed back as I do use this information to improve patient care. Thank you for seeing me today!

## 2020-11-20 NOTE — Progress Notes (Addendum)
PATIENT: Sherri [name] DOB: 11-23-1940  REASON FOR VISIT: follow up HISTORY FROM: patient  Chief Complaint  Patient presents with   Follow-up    RM 1, alone. Here for MS/ f/u, reports doing well, overall. C/o of leg jerking at night, mainly right.       HISTORY OF PRESENT ILLNESS: 11/20/2020 ALL: Sherri returns for follow up for MS. She has not taken DMT in many years. Oxycodone dose was decreased from 10mg  to 5mg  at last visit with Dr Felecia Shelling in 01/2020. She decided to wean it completely. She has not taken oxycodone in months. She continues tramadol 50mg  BID-TID, now written by Dr Quay Burow. She feels that pain is well managed. She does have fairly regular twitching of the right leg at night. It is worse if she is really tired. She feels that valium helps more than anything. She may take a few tablets each month. She is sleeping well. Mood is good. She continues to be active. She does notice more dragging of right leg and foot at times. Seems worse when she is hot or tired. She denies falls. No assistive device. PT has not been helpful in the past.   Hand pain waxes and wanes. Seems to be better on [name], methotrexate and prednisone. She has follow up with Dr Estanislado Pandy 11/28/20.   02/02/2020 RS: She is generally doing well and denies any new MS symptoms.    She stopped all DMTs about 2 years ago (was on Aubagio).    Her only current neurologic issue is some dysesthesias in her right leg.   Her right leg jerks at night  reporting more pain in her hands.   She takes tramadol 50 mg x 2 during the day and oxycodone 10 mg po qHS.   She sleeps better with the oxycodone and her leg jerks are much better with it.      NCV/EMG 06/28/2019 showed mild right C8 radiculopathy and no CTS.   She feels her RA is doing better since starting [name].  She has pain mostly in her hands.   She has OA as well as RA.      She notes mild left facial pain, but less than earlier.       She also has RA and is on  Methotrexate.  She sees Dr. Estanislado Pandy for RA.   She is not on any the disease modifying therapy for the MS.     She has had her covid-19 vaccination and booster shot a couple weeks ago.   09/26/2019 ALL:  Sherri [name] is a 80 y.o. female here today for follow up for MS and pain. NCS/EMG suggestive of mild C8 radiculopathy. Dr Felecia Shelling felt that thumb pain was related to RA. She continues follow up with Dr Estanislado Pandy for RA. Tramadol was increased to 50mg  TID and she continues oxycodone 10mg  at bedtime. Tramadol last filled on 4/18, oxycodone filled 4/5. She usually tries to take 1/2 tramadol tablet (25mg ) three times a day but does use up to three tablets daily. She is taking prednisone 7mg  daily as well as methotrexate and Olumiant. She continues Prolia, calcium and vitamin D for osteoporosis.   She endorses fatigue and generalized pain. No changes in gait or falls. No new numbness or weakness. She continues to have urinary frequency and occasional stress incontinence. She does have chronic constipation. Glycerin suppositories helps with BM. No changes in gait or vision. No falls.   HISTORY: (copied from Dr Garth Bigness note on 05/25/2019)  Sherri [name] is a 80 year old woman who was diagnosed with relapsing remitting MS in 2010.      Update 05/25/19: She is reporting more pain in her hands.   She has had CTR on the right many years ago by Dr. Joya Salm.  She also has pain over the MCP joint.  She does have RA.   Last year she broke her right wrist with a fall.   Her leg just buckled when she stood up.   She takes tramadol 50 mg po qAM and PM and oxycodone 10 mg at night.    Her leg has buckled a few other times.     She has left facial pain.   She had CT maxillofacial and it showed sequelae of prior fillers ( after eyelid lift surgery complicated by a tear)  but no other changes.   She notes reduced sensation V1 and V2 on the left.       She also has RA and is on Methotrexate.    She is not on any the  disease modifying therapy for the MS.  She feels the MS has actually been pretty stable with no change in gait.  She is concerned that the new symptoms in the hands could be due to the MS though I told her I believe that more likely to be due to to the rheumatoid arthritis or carpal tunnel syndrome.   REVIEW OF SYSTEMS: Out of a complete 14 system review of symptoms, the patient complains only of the following symptoms, cramping of hands and feet, twitching right leg, fatigue and all other reviewed systems are negative.  ALLERGIES: Allergies  Allergen Reactions   Darvon [Propoxyphene Hcl] Shortness Of Breath    ? Dose Related ? Lowered respirations greatly   Meperidine Hcl Shortness Of Breath   Penicillins Hives and Other (See Comments)    Has patient had a PCN reaction causing immediate rash, facial/tongue/throat swelling, SOB or lightheadedness with hypotension: No SEVERE RASH INVOLVING MUCUS MEMBRANES or SKIN NECROSIS: #  #  #  YES  #  #  #  Has patient had a PCN reaction that required hospitalization No Has patient had a PCN reaction occurring within the last 10 years: No.    Azathioprine Other (See Comments)    Weakness   Sulfa Antibiotics Other (See Comments)    States had severe indigestion and heartburn and nausea and had to stop taking   Atorvastatin Nausea And Vomiting   Demerol [Meperidine]     Other reaction(s): Respiratory Distress   Leflunomide Other (See Comments)    Excessive weight gain   Ezetimibe-Simvastatin Nausea And Vomiting    HOME MEDICATIONS: Outpatient Medications Prior to Visit  Medication Sig Dispense Refill   atorvastatin (LIPITOR) 10 MG tablet TAKE 1 TAB ON MONDAYS AND FRIDAY 27 tablet 3   Biotin 5000 MCG TABS Take 5,000 mcg by mouth daily.     Calcium Carbonate-Vitamin D (CALCIUM + D PO) Take 1 tablet by mouth daily.      Carboxymethylcellul-Glycerin (LUBRICATING EYE DROPS OP) Apply 1 drop to eye daily as needed (dry eyes).     carvedilol (COREG)  6.25 MG tablet Take 1 tablet (6.25 mg total) by mouth 2 (two) times daily with a meal. 180 tablet 1   Cholecalciferol (VITAMIN D3) 2000 units capsule Take 2,000 Units by mouth daily.     Cyanocobalamin (VITAMIN B-12 PO) Take 1 tablet by mouth daily.     denosumab (PROLIA) 60 MG/ML SOSY injection INJECT  60 MG INTO THE SKIN EVERY 6 (SIX) MONTHS. PLEASE DELIVER TO CLINIC: [facility], [address], [place], [place] [address] 1 mL 1   diazepam (VALIUM) 5 MG tablet Take 1 tablet (5 mg total) by mouth at bedtime. (Patient taking differently: Take 5 mg by mouth as needed.) 30 tablet 5   diclofenac sodium (VOLTAREN) 1 % GEL APPLY 3 GRAMS TO 3 LARGE JOINTS 3 TIMES A DAY AS NEEDED 100 g 0   famotidine (PEPCID) 40 MG tablet Take 1 tablet (40 mg total) by mouth 2 (two) times daily. 60 tablet 11   folic acid (FOLVITE) 1 MG tablet Take 1 tablet (1 mg total) by mouth daily. (Patient taking differently: Take 2 mg by mouth daily.) 90 tablet 3   furosemide (LASIX) 20 MG tablet TAKE 1 TABLET BY MOUTH EVERY DAY 90 tablet 1   methotrexate 50 MG/2ML injection INJECT 0.6ML INTO THE SKIN ONCE WEEKLY. 8 mL 0   predniSONE (DELTASONE) 5 MG tablet TAKE 1 TABLET BY MOUTH EVERY DAY WITH BREAKFAST 90 tablet 0   psyllium (METAMUCIL) 58.6 % powder Take 1 packet by mouth daily.     Pyridoxine HCl (VITAMIN B-6 PO) Take 1 tablet by mouth daily.     traMADol (ULTRAM) 50 MG tablet Take 1 tablet (50 mg total) by mouth 2 (two) times daily. Must keep follow up 11/20/20 for future refills 60 tablet 2   TUBERCULIN SYR 1CC/27GX1/2" 27G X 1/2" 1 ML MISC Use 1 syringe once weekly to inject methotrexate. 12 each 3   Upadacitinib ER 15 MG TB24 TAKE 1 TABLET (15 MG) BY MOUTH DAILY. 30 tablet 0   famotidine (PEPCID) 40 MG tablet TAKE 1 TABLET BY MOUTH EVERY DAY 90 tablet 1   fluconazole (DIFLUCAN) 100 MG tablet Take 1 tablet (100 mg total) by mouth daily. 7 tablet 0   No facility-administered medications prior to visit.    PAST MEDICAL  HISTORY: Past Medical History:  Diagnosis Date   Anxiety    takes Valium daily as needed   Basal cell carcinoma 01/21/1988   left nostril (MOHS), sup-right calf (CX35FU)   Basal cell carcinoma 03/22/1996   right calf (CX35FU)   Basal cell carcinoma 03/31/1995   left wing nose   Bruises easily    d/t meds   Cancer (HCC)    basal cell ca, in situ- uterine    Cataracts, bilateral    removed bilateral   Chronic back pain    Dizziness    r/t to meds   GERD (gastroesophageal reflux disease)    no meds on a regular basis but will take Tums if needed   Headache(784.0)    r/t neck issues   History of bronchitis 6-57yrs ago   History of colon polyps    benign   Hyperlipidemia    takes Atorvastatin on Mondays and Fridays   Hypertension    takes Ramipril daily   Joint pain    Joint swelling    Multiple sclerosis ([place])    Neuromuscular disorder ([place])    Dr. Park Liter- [facility], follows M.S.   Nocturia    PONV (postoperative nausea and vomiting)    trouble urinating after surgery in 2014   Postoperative nausea and vomiting 01/11/2019   Rheumatoid arthritis ([place])    Dr Jani Gravel weekly, RA- hands- knees- feet    Rheumatoid arthritis(714.0)    Dr Estanislado Pandy takes [name] daily   Right wrist fracture    Skin cancer    Squamous  cell carcinoma of skin 11/02/2001   in situ-right knee (cx43fu)   Squamous cell carcinoma of skin 11/12/2011   in situ-left forearm (CX35FU), in situ-left foot (CX35FU)   Squamous cell carcinoma of skin 01/22/2012   in situ-left lower forearm (txpbx)   Squamous cell carcinoma of skin 11/17/2012   right shin (txpbx)   Squamous cell carcinoma of skin 10/28/2013   in situ-right shoulder (CX35FU)   Squamous cell carcinoma of skin 04/28/2014   well diff-right shin (txpbx)   Squamous cell carcinoma of skin 11/02/2014   in situ-Left shin (txpbx)   Squamous cell carcinoma of skin 12/15/2014   in situ-Left hand (txpbx), bowens-left side chest  (txpbx)   Squamous cell carcinoma of skin 05/09/2015   in situ-left hand (txpbx)    Squamous cell carcinoma of skin 05/07/2016   in situ-left inner shin,ant, in situ-left inner shin, post, in situ-left outer forearm, in situ-right knuckle   Squamous cell carcinoma of skin 03/1302018   in situ-left shoulder (txpbx), in situ-right inner shin (txpbx), in situ-top of left foot (txpbx), KA- left forearm (txpbx)   Squamous cell carcinoma of skin 12/02/2016   in situ-left inner shin (txpbx)   Squamous cell carcinoma of skin 03/04/2017   in situ-left outer sup, shin (txpbx), in situ- right 2nd knuckle finger (txpbx), in situ- Left wrist (txpbx)   Squamous cell carcinoma of skin 04/06/2018   in situ-above left knee inner (txpbx)   Squamous cell carcinoma of skin 04/21/2018   in situ-right top hand (txpbx)   Squamous cell carcinoma of skin 07/08/2018   in situ-right lower inner shin (txpbx)   Squamous cell carcinoma of skin 04/27/2019   in situ- Left neck(CX35FU), In situ- right neck (CX35FU)   Urinary retention    sees Dr.Wrenn about 2 times a yr    PAST SURGICAL HISTORY: Past Surgical History:  Procedure Laterality Date   [place]     with TAH   BACK SURGERY     several   BREAST BIOPSY Right    Results were negative   BROW LIFT Bilateral 10/02/2016   Procedure: BILATERAL LOWER LID BLEPHAROPLASTY;  Surgeon: Clista Bernhardt, MD;  Location: [place] [facility];  Service: Plastics;  Laterality: Bilateral;   CARPAL TUNNEL RELEASE Right    cataracts     CERVICAL FUSION     Dr Joya Salm   CERVICAL FUSION  12/31/2012   Dr Joya Salm   CHEST TUBE INSERTION     for traumatic Pneumothorax   COLONOSCOPY  07/16/2010   normal    COLONOSCOPY W/ POLYPECTOMY  1997   negative since; Dr Deatra Ina   ESOPHAGOGASTRODUODENOSCOPY  07/16/2010   normal   eye lid raise     EYE SURGERY Bilateral    cataracts removed - /w IOL   JOINT REPLACEMENT     LUMBAR FUSION     Dr Joya Salm    NASAL SINUS SURGERY     POSTERIOR CERVICAL FUSION/FORAMINOTOMY N/A 12/31/2012   Procedure: POSTERIOR LATERAL CERVICAL FUSION/FORAMINOTOMY LEVEL 1 CERVICAL THREE-FOUR WITH LATERAL MASS SCREWS;  Surgeon: Floyce Stakes, MD;  Location: [place];  Service: Neurosurgery;  Laterality: N/A;   PTOSIS REPAIR Bilateral 10/02/2016   Procedure: INTERNAL PTOSIS REPAIR;  Surgeon: Clista Bernhardt, MD;  Location: [facility];  Service: Plastics;  Laterality: Bilateral;   SKIN BIOPSY     THYROID SURGERY     R lobe removed- 1972, has grown back - CT last  done- 2018   TOTAL HIP ARTHROPLASTY  12/22/2011   Procedure: TOTAL HIP ARTHROPLASTY;  Surgeon: Kerin Salen, MD;  Location: [place];  Service: Orthopedics;  Laterality: Left;   TOTAL SHOULDER ARTHROPLASTY     TUBAL LIGATION     UPPER GASTROINTESTINAL ENDOSCOPY  2012   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    FAMILY HISTORY: Family History  Problem Relation Age of Onset   Cancer Mother        pancreatic   Diabetes Mother    Pancreatic cancer Mother    Heart disease Father        Rheumatic   Cancer Father        ? stomach   Stomach cancer Father    Cancer Sister        stomach   Stomach cancer Sister    Cancer Maternal Aunt        X 4; ? primary   Heart disease Paternal Aunt    Cancer Maternal Grandmother        cervical   Colon cancer Other        Aunts   Multiple sclerosis Daughter    Hypertension Neg Hx    Stroke Neg Hx    Esophageal cancer Neg Hx    Rectal cancer Neg Hx     SOCIAL HISTORY: Social History   Socioeconomic History   Marital status: Married    Spouse name: Not on file   Number of children: Not on file   Years of education: Not on file   Highest education level: Not on file  Occupational History   Occupation: retired  Tobacco Use   Smoking status: Former    Packs/day: 2.50    Years: 20.00    Pack years: 50.00    Types: Cigarettes    Quit date: 05/26/1981    Years since quitting: 39.5   Smokeless tobacco: Never    Tobacco comments:    Smoked 1963-1983, up to 2.5 ppd  Vaping Use   Vaping Use: Never used  Substance and Sexual Activity   Alcohol use: Yes    Alcohol/week: 14.0 standard drinks    Types: 14 Cans of beer per week    Comment: 2% beer   Drug use: Never   Sexual activity: Yes  Other Topics Concern   Not on file  Social History Narrative   Not on file   Social Determinants of Health   Financial Resource Strain: Not on file  Food Insecurity: No Food Insecurity   Worried About Running Out of Food in the Last Year: Never true   Ran Out of Food in the Last Year: Never true  Transportation Needs: No Transportation Needs   Lack of Transportation (Medical): No   Lack of Transportation (Non-Medical): No  Physical Activity: Inactive   Days of Exercise per Week: 0 days   Minutes of Exercise per Session: 0 min  Stress: No Stress Concern Present   Feeling of Stress : Not at all  Social Connections: Socially Integrated   Frequency of Communication with Friends and Family: More than three times a week   Frequency of Social Gatherings with Friends and Family: Once a week   Attends Religious Services: More than 4 times per year   Active Member of [employer] or Organizations: Yes   Attends Music therapist: More than 4 times per year   Marital Status: Married  Human resources officer Violence: Not on file      PHYSICAL  EXAM  Vitals:   11/20/20 1404  BP: 130/68  Pulse: 67  Weight: 119 lb 6.4 oz (54.2 kg)  Height: 5\' 3"  (1.6 m)    Body mass index is 21.15 kg/m.  Generalized: Well developed, in no acute distress  Cardiology: normal rate and rhythm, no murmur noted Respiratory: clear to auscultation bilaterally  Neurological examination  Mentation: Alert oriented to time, place, history taking. Follows all commands speech and language fluent Cranial nerve II-XII: Pupils were equal round reactive to light. Extraocular movements were full, visual field were full  Motor: The motor  testing reveals 5 over 5 strength of all 4 extremities. Good grip strength bilaterally. Good symmetric motor tone is noted throughout.  Sensory: Sensory testing is intact to soft touch on all 4 extremities. No evidence of extinction is noted.  Coordination: Cerebellar testing reveals good finger-nose-finger and heel-to-shin bilaterally.  Gait and station: Gait is normal.  DIAGNOSTIC DATA (LABS, IMAGING, TESTING) - I reviewed patient records, labs, notes, testing and imaging myself where available.  No flowsheet data found.   Lab Results  Component Value Date   WBC 3.7 (L) 11/14/2020   HGB 11.5 (L) 11/14/2020   HCT 33.7 (L) 11/14/2020   MCV 106.6 (H) 11/14/2020   PLT 290 11/14/2020      Component Value Date/Time   NA 139 09/26/2020 1536   NA 139 06/20/2019 1335   NA 136 02/22/2015 0000   K 4.7 09/26/2020 1536   K 4.4 02/22/2015 0000   CL 104 09/26/2020 1536   CL 98 02/22/2015 0000   CO2 27 09/26/2020 1536   CO2 30 02/22/2015 0000   GLUCOSE 74 09/26/2020 1536   BUN 10 09/26/2020 1536   BUN 10 06/20/2019 1335   CREATININE 0.97 (H) 09/26/2020 1536   CALCIUM 9.0 09/26/2020 1536   CALCIUM 9.6 02/22/2015 0000   PROT 6.4 09/26/2020 1536   PROT 6.4 06/20/2019 1335   PROT 6.6 02/22/2015 0000   ALBUMIN 4.2 04/10/2020 0914   ALBUMIN 4.2 06/20/2019 1335   ALBUMIN 4.3 02/22/2015 0000   AST 27 09/26/2020 1536   AST 24 02/22/2015 0000   ALT 19 09/26/2020 1536   ALT 18 02/22/2015 0000   ALKPHOS 29 (L) 04/10/2020 0914   ALKPHOS 34 02/22/2015 0000   BILITOT 0.6 09/26/2020 1536   BILITOT 0.3 06/20/2019 1335   GFRNONAA 56 (L) 09/26/2020 1536   GFRAA 65 09/26/2020 1536   Lab Results  Component Value Date   CHOL 196 08/23/2020   HDL 84.00 08/23/2020   LDLCALC 92 08/23/2020   LDLDIRECT 122.9 11/15/2012   TRIG 100.0 08/23/2020   CHOLHDL 2 08/23/2020   Lab Results  Component Value Date   HGBA1C 5.6 10/24/2013   Lab Results  Component Value Date   VITAMINB12 789 06/20/2019    Lab Results  Component Value Date   TSH 1.37 04/10/2020       ASSESSMENT AND PLAN 80 y.o. year old female  has a past medical history of Anxiety, Basal cell carcinoma (01/21/1988), Basal cell carcinoma (03/22/1996), Basal cell carcinoma (03/31/1995), Bruises easily, Cancer ([place]), Cataracts, bilateral, Chronic back pain, Dizziness, GERD (gastroesophageal reflux disease), Headache(784.0), History of bronchitis (6-57yrs ago), History of colon polyps, Hyperlipidemia, Hypertension, Joint pain, Joint swelling, Multiple sclerosis ([place]), Neuromuscular disorder ([place]), Nocturia, PONV (postoperative nausea and vomiting), Postoperative nausea and vomiting (01/11/2019), Rheumatoid arthritis ([place]), Rheumatoid arthritis(714.0), Right wrist fracture, Skin cancer, Squamous cell carcinoma of skin (11/02/2001), Squamous cell carcinoma of skin (11/12/2011), Squamous cell carcinoma of  skin (01/22/2012), Squamous cell carcinoma of skin (11/17/2012), Squamous cell carcinoma of skin (10/28/2013), Squamous cell carcinoma of skin (04/28/2014), Squamous cell carcinoma of skin (11/02/2014), Squamous cell carcinoma of skin (12/15/2014), Squamous cell carcinoma of skin (05/09/2015), Squamous cell carcinoma of skin (05/07/2016), Squamous cell carcinoma of skin (03/1302018), Squamous cell carcinoma of skin (12/02/2016), Squamous cell carcinoma of skin (03/04/2017), Squamous cell carcinoma of skin (04/06/2018), Squamous cell carcinoma of skin (04/21/2018), Squamous cell carcinoma of skin (07/08/2018), Squamous cell carcinoma of skin (04/27/2019), and Urinary retention. here with     ICD-10-CM   1. Multiple sclerosis ([place])  G35     2. Rheumatoid arthritis involving multiple sites with positive rheumatoid factor (HCC)  M05.79     3. Restless leg  G25.81     4. Gait disturbance  R26.9     5. Other fatigue  R53.83        Sherri is doing well from an MS standpoint.  She continues to have generalized pain that has suffered  more recently with bilateral hand and foot pain.  She will continue tramadol 50 mg up to 3 times daily. Last refilled by Dr Quay Burow. I have reviewed PMP aware and refills are appropriate. She has weaned oxycodone and feels she is doing well. She does not want to try Requip or Mirapex for leg twitching. Gabapentin was not helpful. She will call me for worsening. She will let me know if leg weakness gets any worse. Fall precautions advised. She was encouraged to continue close follow up with Dr Quay Burow and Dr. Estanislado Pandy. She was encouraged to remain as active as possible.  She will stay well-hydrated and focus on healthy lifestyle habits.  She will follow-up with Dr. Felecia Shelling in 1 year, sooner if needed.  She verbalizes understanding and agreement with this plan.   No orders of the defined types were placed in this encounter.    No orders of the defined types were placed in this encounter.     Debbora Presto, FNP-C 11/20/2020, 2:53 PM [facility] [address] [place], [place] [address] [phone]   I have read the note, and I agree with the clinical assessment and plan.  Richard A. Felecia Shelling, MD, PhD, [facility] Certified in Neurology, Clinical Neurophysiology, Sleep Medicine, Pain Medicine and Neuroimaging  [facility] [address], [place], [place] [address] [phone]

## 2020-11-28 ENCOUNTER — Ambulatory Visit: Payer: Medicare Other | Admitting: Rheumatology

## 2020-11-28 ENCOUNTER — Other Ambulatory Visit: Payer: Self-pay

## 2020-11-28 ENCOUNTER — Encounter: Payer: Self-pay | Admitting: Rheumatology

## 2020-11-28 ENCOUNTER — Telehealth: Payer: Self-pay

## 2020-11-28 VITALS — BP 119/77 | HR 69 | Ht 63.0 in | Wt 118.0 lb

## 2020-11-28 DIAGNOSIS — M810 Age-related osteoporosis without current pathological fracture: Secondary | ICD-10-CM

## 2020-11-28 DIAGNOSIS — G35 Multiple sclerosis: Secondary | ICD-10-CM | POA: Diagnosis not present

## 2020-11-28 DIAGNOSIS — M0579 Rheumatoid arthritis with rheumatoid factor of multiple sites without organ or systems involvement: Secondary | ICD-10-CM

## 2020-11-28 DIAGNOSIS — M1611 Unilateral primary osteoarthritis, right hip: Secondary | ICD-10-CM | POA: Diagnosis not present

## 2020-11-28 DIAGNOSIS — Z96642 Presence of left artificial hip joint: Secondary | ICD-10-CM

## 2020-11-28 DIAGNOSIS — Z79899 Other long term (current) drug therapy: Secondary | ICD-10-CM | POA: Diagnosis not present

## 2020-11-28 DIAGNOSIS — M5136 Other intervertebral disc degeneration, lumbar region: Secondary | ICD-10-CM

## 2020-11-28 DIAGNOSIS — F5101 Primary insomnia: Secondary | ICD-10-CM | POA: Diagnosis not present

## 2020-11-28 DIAGNOSIS — R5383 Other fatigue: Secondary | ICD-10-CM

## 2020-11-28 DIAGNOSIS — Z8679 Personal history of other diseases of the circulatory system: Secondary | ICD-10-CM

## 2020-11-28 DIAGNOSIS — Z8659 Personal history of other mental and behavioral disorders: Secondary | ICD-10-CM

## 2020-11-28 DIAGNOSIS — S62101D Fracture of unspecified carpal bone, right wrist, subsequent encounter for fracture with routine healing: Secondary | ICD-10-CM

## 2020-11-28 DIAGNOSIS — M4312 Spondylolisthesis, cervical region: Secondary | ICD-10-CM

## 2020-11-28 DIAGNOSIS — Z8639 Personal history of other endocrine, nutritional and metabolic disease: Secondary | ICD-10-CM

## 2020-11-28 DIAGNOSIS — E559 Vitamin D deficiency, unspecified: Secondary | ICD-10-CM | POA: Diagnosis not present

## 2020-11-28 NOTE — Patient Instructions (Addendum)
Standing Labs We placed an order today for your standing lab work.   Please have your standing labs drawn in September and every 3 months  If possible, please have your labs drawn 2 weeks prior to your appointment so that the provider can discuss your results at your appointment.  Please note that you may see your imaging and lab results in [place] before we have reviewed them. We may be awaiting multiple results to interpret others before contacting you. Please allow our office up to 72 hours to thoroughly review all of the results before contacting the office for clarification of your results.  We have open lab daily: Monday through Thursday from 1:30-4:30 PM and Friday from 1:30-4:00 PM at the office of Dr. Bo Merino, [facility].   Please be advised, all patients with office appointments requiring lab work will take precedent over walk-in lab work.  If possible, please come for your lab work on Monday and Friday afternoons, as you may experience shorter wait times. The office is located at [address], [place], [place], [place] [address] No appointment is necessary.   Labs are drawn by [facility]. Please bring your co-pay at the time of your lab draw.  You may receive a bill from [facility] for your lab work.  If you wish to have your labs drawn at another location, please call the office 24 hours in advance to send orders.  If you have any questions regarding directions or hours of operation,  please call [phone].   As a reminder, please drink plenty of water prior to coming for your lab work. Thanks!   If you have signs or symptoms of an infection or start antibiotics: First, call your PCP for workup of your infection. Hold your medication through the infection, until you complete your antibiotics, and until symptoms resolve if you take the following: Injectable medication (Actemra, Benlysta, Cimzia, Cosentyx, Enbrel, Humira, Kevzara, Orencia, Remicade, Simponi,  Stelara, Taltz, Tremfya) Methotrexate Leflunomide (Arava) Mycophenolate (Cellcept) [name], Olumiant, or [name]  If you test POSITIVE for COVID19 and have MILD to MODERATE symptoms: First, call your PCP if you would like to receive COVID19 treatment AND Hold your medications during the infection and for at least 1 week after your symptoms have resolved: Injectable medication (Benlysta, Cimzia, Cosentyx, Enbrel, Humira, Orencia, Remicade, Simponi, Stelara, Taltz, Tremfya) Methotrexate Leflunomide (Arava) Mycophenolate (Cellcept) [name], Olumiant, or [name] If you take Actemra or Kevzara, you DO NOT need to hold these for COVID19 infection.  If you test POSITIVE for COVID19 and have NO symptoms: First, call your PCP if you would like to receive COVID19 treatment AND Hold your medications for at least 10 days after the day that you tested positive Injectable medication (Benlysta, Cimzia, Cosentyx, Enbrel, Humira, Orencia, Remicade, Simponi, Stelara, Taltz, Tremfya) Methotrexate Leflunomide (Arava) Mycophenolate (Cellcept) [name], Olumiant, or [name] If you take Actemra or Kevzara, you DO NOT need to hold these for COVID19 infection.  If you have signs or symptoms of an infection or start antibiotics: First, call your PCP for workup of your infection. Hold your medication through the infection, until you complete your antibiotics, and until symptoms resolve if you take the following: Injectable medication (Actemra, Benlysta, Cimzia, Cosentyx, Enbrel, Humira, Kevzara, Orencia, Remicade, Simponi, Stelara, Taltz, Tremfya) Methotrexate Leflunomide (Arava) Mycophenolate (Cellcept) [name], Olumiant, or [name]  Heart Disease Prevention   Your inflammatory disease increases your risk of heart disease which includes heart attack, stroke, atrial fibrillation (irregular heartbeats), high blood pressure, heart failure and  atherosclerosis (plaque in the arteries).  It is important to reduce your  risk by:   Keep blood pressure, cholesterol, and blood sugar at healthy levels   Smoking Cessation   Maintain a healthy weight  BMI 20-25   Eat a healthy diet  Plenty of fresh fruit, vegetables, and whole grains  Limit saturated fats, foods high in sodium, and added sugars  DASH and Mediterranean diet   Increase physical activity  Recommend moderate physically activity for 150 minutes per week/ 30 minutes a day for five days a week These can be broken up into three separate ten-minute sessions during the day.   Reduce Stress  Meditation, slow breathing exercises, yoga, coloring books  Dental visits twice a year

## 2020-11-28 NOTE — Telephone Encounter (Signed)
Per Dr. Estanislado Pandy, patient is due for prolia injection in August 2022. Thanks!

## 2020-11-29 ENCOUNTER — Other Ambulatory Visit (HOSPITAL_COMMUNITY): Payer: Self-pay

## 2020-11-29 NOTE — Telephone Encounter (Signed)
Test claim revealed that insurance covers $1,183.51, leaving patient with a copay of $394.50. Patient appears to currently be in the coverage gap.

## 2020-11-30 NOTE — Telephone Encounter (Signed)
Plan?follows Medicare guidelines and?covers 80% of the infusion and no authorization is required?for [id number]. Patient has an HMO plan all providers must be in network. [facility] is in Network. Patient does not have Medicare Supplement Plan, so patient will be required to pay the 20% coinsurance per each infusion which will be applied to her Max Out of Pocket- $3,600. Patient has met $895.63 as of 11/30/2020.

## 2020-12-03 ENCOUNTER — Other Ambulatory Visit: Payer: Self-pay | Admitting: Rheumatology

## 2020-12-04 ENCOUNTER — Telehealth: Payer: Self-pay | Admitting: Internal Medicine

## 2020-12-04 MED ORDER — TRAMADOL HCL 50 MG PO TABS: 50.0000 mg | ORAL_TABLET | Freq: Two times a day (BID) | ORAL | 2 refills | Status: DC

## 2020-12-04 NOTE — Telephone Encounter (Signed)
    Patient requesting refill for traMADol (ULTRAM) 50 MG tablet   Pharmacy [id number] [place], [place]

## 2020-12-12 ENCOUNTER — Other Ambulatory Visit: Payer: Self-pay | Admitting: *Deleted

## 2020-12-12 ENCOUNTER — Telehealth: Payer: Self-pay | Admitting: *Deleted

## 2020-12-12 ENCOUNTER — Telehealth: Payer: Self-pay | Admitting: Rheumatology

## 2020-12-12 DIAGNOSIS — Z79899 Other long term (current) drug therapy: Secondary | ICD-10-CM | POA: Diagnosis not present

## 2020-12-12 DIAGNOSIS — M810 Age-related osteoporosis without current pathological fracture: Secondary | ICD-10-CM | POA: Diagnosis not present

## 2020-12-12 DIAGNOSIS — E559 Vitamin D deficiency, unspecified: Secondary | ICD-10-CM | POA: Diagnosis not present

## 2020-12-12 DIAGNOSIS — M0579 Rheumatoid arthritis with rheumatoid factor of multiple sites without organ or systems involvement: Secondary | ICD-10-CM

## 2020-12-12 NOTE — Telephone Encounter (Signed)
Wayne Sever called patient today and notified her that she is due for Prolia labs. Orders for CBC, CMP, and Vitamin D in placed.  Knox Saliva, PharmD, MPH, BCPS Clinical Pharmacist (Rheumatology and Pulmonology)

## 2020-12-12 NOTE — Telephone Encounter (Signed)
Medication Samples have been provided to the patient.  Drug name: [name] Strength: 15 mg Qty: 2 LOT: [id number] Exp.Date: [id number]  Dosing instructions: Take one tablet by mouth daily.

## 2020-12-12 NOTE — Telephone Encounter (Signed)
Patient requesting a sample of [name]. Patient has 4 days left. Please call to advise.

## 2020-12-12 NOTE — Telephone Encounter (Signed)
Patient advised we have a sample in the office for [name]. Patient advised she may come by the office to pick up. Patient advised she is also due for labs prior to her Prolia. Patient expressed understanding.

## 2020-12-13 LAB — CBC WITH DIFFERENTIAL/PLATELET
Absolute Monocytes: 671 cells/uL (ref 200–950)
Basophils Absolute: 18 cells/uL (ref 0–200)
Basophils Relative: 0.4 %
Eosinophils Absolute: 9 cells/uL — ABNORMAL LOW (ref 15–500)
Eosinophils Relative: 0.2 %
HCT: 34.5 % — ABNORMAL LOW (ref 35.0–45.0)
Hemoglobin: 12.2 g/dL (ref 11.7–15.5)
Lymphs Abs: 797 cells/uL — ABNORMAL LOW (ref 850–3900)
MCH: 36.5 pg — ABNORMAL HIGH (ref 27.0–33.0)
MCHC: 35.4 g/dL (ref 32.0–36.0)
MCV: 103.3 fL — ABNORMAL HIGH (ref 80.0–100.0)
MPV: 9.5 fL (ref 7.5–12.5)
Monocytes Relative: 14.9 %
Neutro Abs: 3006 cells/uL (ref 1500–7800)
Neutrophils Relative %: 66.8 %
Platelets: 305 10*3/uL (ref 140–400)
RBC: 3.34 10*6/uL — ABNORMAL LOW (ref 3.80–5.10)
RDW: 13.4 % (ref 11.0–15.0)
Total Lymphocyte: 17.7 %
WBC: 4.5 10*3/uL (ref 3.8–10.8)

## 2020-12-13 LAB — COMPLETE METABOLIC PANEL WITH GFR
AG Ratio: 2 (calc) (ref 1.0–2.5)
ALT: 31 U/L — ABNORMAL HIGH (ref 6–29)
AST: 37 U/L — ABNORMAL HIGH (ref 10–35)
Albumin: 4.3 g/dL (ref 3.6–5.1)
Alkaline phosphatase (APISO): 26 U/L — ABNORMAL LOW (ref 37–153)
BUN: 10 mg/dL (ref 7–25)
CO2: 31 mmol/L (ref 20–32)
Calcium: 9.1 mg/dL (ref 8.6–10.4)
Chloride: 101 mmol/L (ref 98–110)
Creat: 0.91 mg/dL (ref 0.60–1.00)
Globulin: 2.1 g/dL (calc) (ref 1.9–3.7)
Glucose, Bld: 98 mg/dL (ref 65–99)
Potassium: 4.7 mmol/L (ref 3.5–5.3)
Sodium: 137 mmol/L (ref 135–146)
Total Bilirubin: 0.5 mg/dL (ref 0.2–1.2)
Total Protein: 6.4 g/dL (ref 6.1–8.1)
eGFR: 65 mL/min/{1.73_m2} (ref >=60)

## 2020-12-13 LAB — VITAMIN D 25 HYDROXY (VIT D DEFICIENCY, FRACTURES): Vit D, 25-Hydroxy: 58 ng/mL (ref 30–100)

## 2020-12-14 ENCOUNTER — Other Ambulatory Visit (HOSPITAL_COMMUNITY): Payer: Self-pay

## 2020-12-14 MED ORDER — DENOSUMAB 60 MG/ML ~~LOC~~ SOSY
60.0000 mg | PREFILLED_SYRINGE | SUBCUTANEOUS | 0 refills | Status: DC
  Filled 2020-12-14: qty 1, 180d supply, fill #0

## 2020-12-14 NOTE — Telephone Encounter (Signed)
Verified that Rinaldo Ratel does not need to be updated with Tiffany at the Call Center. Rx queued to be filled and shipped to clinic at the beginning of next week.

## 2020-12-14 NOTE — Telephone Encounter (Signed)
Labs wnl to proceed with Prolia. Rx sent to [facility]. Will notify pharmacy to fill and deliver to clinic.  Will route to Gwenlyn Perking, LPN - patient can be scheduled for Prolia on or after 12/19/20  Knox Saliva, PharmD, MPH, BCPS Clinical Pharmacist (Rheumatology and Pulmonology)

## 2020-12-14 NOTE — Addendum Note (Signed)
Addended by: Cassandria Anger on: 12/14/2020 10:25 AM   Modules accepted: Orders

## 2020-12-17 ENCOUNTER — Other Ambulatory Visit (HOSPITAL_COMMUNITY): Payer: Self-pay

## 2020-12-17 NOTE — Telephone Encounter (Signed)
Prolia received in the office via courier. Placed in [place] #2.

## 2020-12-17 NOTE — Telephone Encounter (Signed)
Patient scheduled for 01/01/2021 to receive Prolia injection.

## 2020-12-18 ENCOUNTER — Other Ambulatory Visit (HOSPITAL_COMMUNITY): Payer: Self-pay

## 2020-12-18 ENCOUNTER — Other Ambulatory Visit: Payer: Self-pay

## 2020-12-18 MED ORDER — FOLIC ACID 1 MG PO TABS: 2.0000 mg | ORAL_TABLET | Freq: Every day | ORAL | 3 refills | Status: DC

## 2020-12-18 NOTE — Telephone Encounter (Signed)
Next Visit: 02/27/2021  Last Visit: 11/28/2020  Last Fill: 08/07/2020  Dx: Rheumatoid arthritis involving multiple sites with positive rheumatoid factor  Current Dose per office note on [id number]: folic acid 1 mg p.o,  Okay to refill folic acid?   Per patient, she is taking 2 mg daily, note from 11/28/2020 states 1 mg daily. Please advise.

## 2020-12-18 NOTE — Progress Notes (Signed)
Subjective:    Patient ID: Sherri [name], female    DOB: [birth year], 80 y.o.   MRN: [id number]  HPI The patient is here for an acute visit.   Leg bruising - b/l lower leg and lower arm bruising.  No trauma. They just appears.  Some of them are starting to burn - the skin is sensitive.  She was concerned there is anything that she could do to stop the bruising.  She does not need to see her neurologist for another year because her multiple sclerosis has been stable.  She was wondering if I would continue to prescribe her tramadol and Valium.  She takes tramadol twice daily for pain and it works well.  She takes the Valium nightly as needed for muscle twitching, which also works well.   Medications and allergies reviewed with patient and updated if appropriate.  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   Restless leg 02/02/2020   Bilateral leg edema 01/11/2020   Cervical radiculopathy 06/28/2019   Carpal tunnel syndrome on both sides 06/28/2019   Right sided sciatica 04/14/2018   Trochanteric bursitis of right hip 04/14/2018   S/P cervical spinal fusion 11/10/2017   Dysphagia 10/26/2017   Gastroesophageal reflux disease 08/06/2017   Easy bruising 08/06/2017   Mid back pain 03/26/2017   Submandibular gland swelling 01/08/2017   High risk medication use 09/02/2016   Piriformis muscle pain 09/02/2016   Right hip pain 04/08/2016   Right knee pain 02/25/2016   Numbness 11/14/2014   Biceps tendon tear 10/10/2014   Insomnia 10/10/2014   Gait disturbance 06/13/2014   OP (osteoporosis) 01/18/2014   Other long term (current) drug therapy 01/18/2014   Hoarseness 12/29/2013   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 01/01/2013   Personal history of colonic polyps 07/03/2010   Nocturia 05/28/2010   THYROID NODULE 05/17/2008   Multiple sclerosis ([place]) 05/17/2008   Other fatigue 03/30/2007   Hyperlipidemia 08/20/2006   Essential  hypertension 08/20/2006   Rheumatoid arthritis ([place]) 08/20/2006   CERVICAL CANCER, HX OF 08/20/2006   GASTRIC ULCER, HX OF 08/20/2006    Current Outpatient Medications on File Prior to Visit  Medication Sig Dispense Refill   atorvastatin (LIPITOR) 10 MG tablet TAKE 1 TAB ON MONDAYS AND FRIDAY 27 tablet 3   Biotin 5000 MCG TABS Take 5,000 mcg by mouth daily.     Calcium Carbonate-Vitamin D (CALCIUM + D PO) Take 1 tablet by mouth daily.      Carboxymethylcellul-Glycerin (LUBRICATING EYE DROPS OP) Apply 1 drop to eye daily as needed (dry eyes).     carvedilol (COREG) 6.25 MG tablet Take 1 tablet (6.25 mg total) by mouth 2 (two) times daily with a meal. 180 tablet 1   Cholecalciferol (VITAMIN D3) 2000 units capsule Take 2,000 Units by mouth daily.     Cyanocobalamin (VITAMIN B-12 PO) Take 1 tablet by mouth daily.     denosumab (PROLIA) 60 MG/ML SOSY injection Inject 60 mg into the skin every 6 (six) months. Deliver to clinic: [address], [place], [place] [address] 1 mL 0   diazepam (VALIUM) 5 MG tablet Take 1 tablet (5 mg total) by mouth at bedtime. (Patient taking differently: Take 5 mg by mouth as needed.) 30 tablet 5   diclofenac sodium (VOLTAREN) 1 % GEL APPLY 3 GRAMS TO 3 LARGE JOINTS 3 TIMES A DAY AS NEEDED 100 g 0   famotidine (PEPCID) 40 MG  tablet Take 1 tablet (40 mg total) by mouth 2 (two) times daily. 60 tablet 11   folic acid (FOLVITE) 1 MG tablet Take 2 tablets (2 mg total) by mouth daily. 180 tablet 3   furosemide (LASIX) 20 MG tablet TAKE 1 TABLET BY MOUTH EVERY DAY 90 tablet 1   methotrexate 50 MG/2ML injection INJECT 0.6ML INTO THE SKIN ONCE WEEKLY. 8 mL 0   predniSONE (DELTASONE) 5 MG tablet TAKE 1 TABLET BY MOUTH EVERY DAY WITH BREAKFAST 90 tablet 0   psyllium (METAMUCIL) 58.6 % powder Take 1 packet by mouth as needed.     Pyridoxine HCl (VITAMIN B-6 PO) Take 1 tablet by mouth daily.     traMADol (ULTRAM) 50 MG tablet Take 1 tablet (50 mg total) by mouth 2 (two)  times daily. Must keep follow up 11/20/20 for future refills 60 tablet 2   TUBERCULIN SYR 1CC/27GX1/2" 27G X 1/2" 1 ML MISC Use 1 syringe once weekly to inject methotrexate. 12 each 3   Upadacitinib ER 15 MG TB24 TAKE 1 TABLET (15 MG) BY MOUTH DAILY. 30 tablet 0   No current facility-administered medications on file prior to visit.    Past Medical History:  Diagnosis Date   Anxiety    takes Valium daily as needed   Basal cell carcinoma 01/21/1988   left nostril (MOHS), sup-right calf (CX35FU)   Basal cell carcinoma 03/22/1996   right calf (CX35FU)   Basal cell carcinoma 03/31/1995   left wing nose   Bruises easily    d/t meds   Cancer (HCC)    basal cell ca, in situ- uterine    Cataracts, bilateral    removed bilateral   Chronic back pain    Dizziness    r/t to meds   GERD (gastroesophageal reflux disease)    no meds on a regular basis but will take Tums if needed   Headache(784.0)    r/t neck issues   History of bronchitis 6-32yr ago   History of colon polyps    benign   Hyperlipidemia    takes Atorvastatin on Mondays and Fridays   Hypertension    takes Ramipril daily   Joint pain    Joint swelling    Multiple sclerosis ([place]    Neuromuscular disorder ([place]    Dr. RPark Liter [facility], follows [facility]   Nocturia    PONV (postoperative nausea and vomiting)    trouble urinating after surgery in 2014   Postoperative nausea and vomiting 01/11/2019   Rheumatoid arthritis ([place]    Dr VJani Gravelweekly, RA- hands- knees- feet    Rheumatoid arthritis(714.0)    Dr DEstanislado Pandytakes XMorrie Sheldondaily   Right wrist fracture    Skin cancer    Squamous cell carcinoma of skin 11/02/2001   in situ-right knee (cx357f   Squamous cell carcinoma of skin 11/12/2011   in situ-left forearm (CX35FU), in situ-left foot (CX35FU)   Squamous cell carcinoma of skin 01/22/2012   in situ-left lower forearm (txpbx)   Squamous cell carcinoma of skin 11/17/2012   right shin (txpbx)    Squamous cell carcinoma of skin 10/28/2013   in situ-right shoulder (CX35FU)   Squamous cell carcinoma of skin 04/28/2014   well diff-right shin (txpbx)   Squamous cell carcinoma of skin 11/02/2014   in situ-Left shin (txpbx)   Squamous cell carcinoma of skin 12/15/2014   in situ-Left hand (txpbx), bowens-left side chest (txpbx)   Squamous cell carcinoma of skin 05/09/2015  in situ-left hand (txpbx)    Squamous cell carcinoma of skin 05/07/2016   in situ-left inner shin,ant, in situ-left inner shin, post, in situ-left outer forearm, in situ-right knuckle   Squamous cell carcinoma of skin 03/1302018   in situ-left shoulder (txpbx), in situ-right inner shin (txpbx), in situ-top of left foot (txpbx), KA- left forearm (txpbx)   Squamous cell carcinoma of skin 12/02/2016   in situ-left inner shin (txpbx)   Squamous cell carcinoma of skin 03/04/2017   in situ-left outer sup, shin (txpbx), in situ- right 2nd knuckle finger (txpbx), in situ- Left wrist (txpbx)   Squamous cell carcinoma of skin 04/06/2018   in situ-above left knee inner (txpbx)   Squamous cell carcinoma of skin 04/21/2018   in situ-right top hand (txpbx)   Squamous cell carcinoma of skin 07/08/2018   in situ-right lower inner shin (txpbx)   Squamous cell carcinoma of skin 04/27/2019   in situ- Left neck(CX35FU), In situ- right neck (CX35FU)   Urinary retention    sees Dr.Wrenn about 2 times a yr    Past Surgical History:  Procedure Laterality Date   [place]     with TAH   BACK SURGERY     several   BREAST BIOPSY Right    Results were negative   BROW LIFT Bilateral 10/02/2016   Procedure: BILATERAL LOWER LID BLEPHAROPLASTY;  Surgeon: Clista Bernhardt, MD;  Location: [facility];  Service: Plastics;  Laterality: Bilateral;   CARPAL TUNNEL RELEASE Right    cataracts     CERVICAL FUSION     Dr Joya Salm   CERVICAL FUSION  12/31/2012   Dr Joya Salm   CHEST TUBE INSERTION     for  traumatic Pneumothorax   COLONOSCOPY  07/16/2010   normal    COLONOSCOPY W/ POLYPECTOMY  1997   negative since; Dr Deatra Ina   ESOPHAGOGASTRODUODENOSCOPY  07/16/2010   normal   eye lid raise     EYE SURGERY Bilateral    cataracts removed - /w IOL   JOINT REPLACEMENT     LUMBAR FUSION     Dr Joya Salm   NASAL SINUS SURGERY     POSTERIOR CERVICAL FUSION/FORAMINOTOMY N/A 12/31/2012   Procedure: POSTERIOR LATERAL CERVICAL FUSION/FORAMINOTOMY LEVEL 1 CERVICAL THREE-FOUR WITH LATERAL MASS SCREWS;  Surgeon: Floyce Stakes, MD;  Location: [facility];  Service: Neurosurgery;  Laterality: N/A;   PTOSIS REPAIR Bilateral 10/02/2016   Procedure: INTERNAL PTOSIS REPAIR;  Surgeon: Clista Bernhardt, MD;  Location: [facility];  Service: Plastics;  Laterality: Bilateral;   SKIN BIOPSY     THYROID SURGERY     R lobe removed- 1972, has grown back - CT last done- 2018   TOTAL HIP ARTHROPLASTY  12/22/2011   Procedure: TOTAL HIP ARTHROPLASTY;  Surgeon: Kerin Salen, MD;  Location: [facility];  Service: Orthopedics;  Laterality: Left;   TOTAL SHOULDER ARTHROPLASTY     TUBAL LIGATION     UPPER GASTROINTESTINAL ENDOSCOPY  2012   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    Social History   Socioeconomic History   Marital status: Married    Spouse name: Not on file   Number of children: Not on file   Years of education: Not on file   Highest education level: Not on file  Occupational History   Occupation: retired  Tobacco Use   Smoking status: Former    Packs/day: 2.50    Years: 20.00  Pack years: 50.00    Types: Cigarettes    Quit date: 05/26/1981    Years since quitting: 39.5   Smokeless tobacco: Never   Tobacco comments:    Smoked [phone], up to 2.5 ppd  Vaping Use   Vaping Use: Never used  Substance and Sexual Activity   Alcohol use: Yes    Alcohol/week: 14.0 standard drinks    Types: 14 Cans of beer per week    Comment: 2% beer   Drug use: Never   Sexual activity: Yes  Other Topics Concern   Not  on file  Social History Narrative   Not on file   Social Determinants of Health   Financial Resource Strain: Not on file  Food Insecurity: No Food Insecurity   Worried About Running Out of Food in the Last Year: Never true   Ran Out of Food in the Last Year: Never true  Transportation Needs: No Transportation Needs   Lack of Transportation (Medical): No   Lack of Transportation (Non-Medical): No  Physical Activity: Inactive   Days of Exercise per Week: 0 days   Minutes of Exercise per Session: 0 min  Stress: No Stress Concern Present   Feeling of Stress : Not at all  Social Connections: Socially Integrated   Frequency of Communication with Friends and Family: More than three times a week   Frequency of Social Gatherings with Friends and Family: Once a week   Attends Religious Services: More than 4 times per year   Active Member of [employer] or Organizations: Yes   Attends Music therapist: More than 4 times per year   Marital Status: Married    Family History  Problem Relation Age of Onset   Cancer Mother        pancreatic   Diabetes Mother    Pancreatic cancer Mother    Heart disease Father        Rheumatic   Cancer Father        ? stomach   Stomach cancer Father    Cancer Sister        stomach   Stomach cancer Sister    Cancer Maternal Aunt        X 4; ? primary   Heart disease Paternal Aunt    Cancer Maternal Grandmother        cervical   Colon cancer Other        Aunts   Multiple sclerosis Daughter    Hypertension Neg Hx    Stroke Neg Hx    Esophageal cancer Neg Hx    Rectal cancer Neg Hx     Review of Systems     Objective:   Vitals:   12/19/20 1524  BP: 118/70  Pulse: 67  Temp: 98.1 F (36.7 C)  SpO2: 98%   BP Readings from Last 3 Encounters:  12/19/20 118/70  11/28/20 119/77  11/20/20 130/68   Wt Readings from Last 3 Encounters:  12/19/20 117 lb 9.6 oz (53.3 kg)  11/28/20 118 lb (53.5 kg)  11/20/20 119 lb 6.4 oz (54.2 kg)    Body mass index is 20.83 kg/m.   Physical Exam Constitutional:      General: She is not in acute distress.    Appearance: Normal appearance. She is not ill-appearing.  HENT:     Head: Normocephalic and atraumatic.  Skin:    General: Skin is warm and dry.     Findings: No rash.     Comments: Benign-appearing bruising  on bilateral lower upper extremities and lower extremities.  Even some mild bruising on chest.  Typical of senile purpura  Neurological:     Mental Status: She is alert.           Assessment & Plan:    See Problem List for Assessment and Plan of chronic medical problems.    This visit occurred during the SARS-CoV-2 public health emergency.  Safety protocols were in place, including screening questions prior to the visit, additional usage of staff PPE, and extensive cleaning of exam room while observing appropriate contact time as indicated for disinfecting solutions.

## 2020-12-18 NOTE — Telephone Encounter (Signed)
Patient called requesting prescription refill of [facility] [place]  Patient states she needs the prescription called into the pharmacy because she ran out of medication 1 month early due to Dr. Estanislado Pandy changing the dosage to 2 tablets per day.

## 2020-12-19 ENCOUNTER — Other Ambulatory Visit: Payer: Self-pay

## 2020-12-19 ENCOUNTER — Ambulatory Visit (INDEPENDENT_AMBULATORY_CARE_PROVIDER_SITE_OTHER): Payer: Medicare Other | Admitting: Internal Medicine

## 2020-12-19 ENCOUNTER — Encounter: Payer: Self-pay | Admitting: Internal Medicine

## 2020-12-19 DIAGNOSIS — D692 Other nonthrombocytopenic purpura: Secondary | ICD-10-CM | POA: Diagnosis not present

## 2020-12-19 DIAGNOSIS — G35 Multiple sclerosis: Secondary | ICD-10-CM

## 2020-12-19 NOTE — Patient Instructions (Addendum)
    Your bruising is related to your thin skin and prednisone.  It is benign.

## 2020-12-19 NOTE — Assessment & Plan Note (Signed)
Acute Bilateral upper extremity and lower extremity and even chest bruising related to senile purpura Bruising for the most part is atraumatic and asymptomatic She has been on steroids for a very long time and does have some thinning of the skin on exam-discussed etiology Reassured

## 2020-12-19 NOTE — Assessment & Plan Note (Signed)
Chronic Stable and controlled she will not be seeing Dr. Felecia Shelling for 1 year because she has been so stable and she would like me to prescribe her medication, which I have agreed to do Continue tramadol 50 mg twice daily as needed for pain Continue Valium 5 mg nightly as needed for muscle jerking related to muscular sclerosis Tramadol has been filled recently, so no refill is needed and she will let me know when she needs the Valium sent to the pharmacy

## 2021-01-01 ENCOUNTER — Other Ambulatory Visit: Payer: Self-pay

## 2021-01-01 ENCOUNTER — Ambulatory Visit (INDEPENDENT_AMBULATORY_CARE_PROVIDER_SITE_OTHER): Payer: Medicare Other | Admitting: *Deleted

## 2021-01-01 VITALS — BP 99/61 | HR 77

## 2021-01-01 DIAGNOSIS — M810 Age-related osteoporosis without current pathological fracture: Secondary | ICD-10-CM | POA: Diagnosis not present

## 2021-01-01 MED ORDER — DENOSUMAB 60 MG/ML ~~LOC~~ SOSY
60.0000 mg | PREFILLED_SYRINGE | Freq: Once | SUBCUTANEOUS | Status: AC
  Administered 2021-01-01: 60 mg via SUBCUTANEOUS

## 2021-01-01 NOTE — Progress Notes (Signed)
Pharmacy Note  Subjective:   Patient presents to clinic today to receive bi-annual dose of Prolia.  Patient running a fever or have signs/symptoms of infection? No  Patient currently on antibiotics for the treatment of infection? No  Patient had fall in the last 6 months?  No  If yes, did it require medical attention? No   Patient taking calcium 1200 mg daily through diet or supplement and at least 800 units vitamin D? Yes  Objective: CMP     Component Value Date/Time   NA 137 12/12/2020 1514   NA 139 06/20/2019 1335   NA 136 02/22/2015 0000   K 4.7 12/12/2020 1514   K 4.4 02/22/2015 0000   CL 101 12/12/2020 1514   CL 98 02/22/2015 0000   CO2 31 12/12/2020 1514   CO2 30 02/22/2015 0000   GLUCOSE 98 12/12/2020 1514   BUN 10 12/12/2020 1514   BUN 10 06/20/2019 1335   CREATININE 0.91 12/12/2020 1514   CALCIUM 9.1 12/12/2020 1514   CALCIUM 9.6 02/22/2015 0000   PROT 6.4 12/12/2020 1514   PROT 6.4 06/20/2019 1335   PROT 6.6 02/22/2015 0000   ALBUMIN 4.2 04/10/2020 0914   ALBUMIN 4.2 06/20/2019 1335   ALBUMIN 4.3 02/22/2015 0000   AST 37 (H) 12/12/2020 1514   AST 24 02/22/2015 0000   ALT 31 (H) 12/12/2020 1514   ALT 18 02/22/2015 0000   ALKPHOS 29 (L) 04/10/2020 0914   ALKPHOS 34 02/22/2015 0000   BILITOT 0.5 12/12/2020 1514   BILITOT 0.3 06/20/2019 1335   GFRNONAA 56 (L) 09/26/2020 1536   GFRAA 65 09/26/2020 1536    CBC    Component Value Date/Time   WBC 4.5 12/12/2020 1514   RBC 3.34 (L) 12/12/2020 1514   HGB 12.2 12/12/2020 1514   HGB 13.0 06/20/2019 1335   HCT 34.5 (L) 12/12/2020 1514   HCT 38.0 06/20/2019 1335   PLT 305 12/12/2020 1514   PLT 363 06/20/2019 1335   MCV 103.3 (H) 12/12/2020 1514   MCV 100 (H) 06/20/2019 1335   MCH 36.5 (H) 12/12/2020 1514   MCHC 35.4 12/12/2020 1514   RDW 13.4 12/12/2020 1514   RDW 13.4 06/20/2019 1335   LYMPHSABS 797 (L) 12/12/2020 1514   LYMPHSABS 0.6 (L) 06/20/2019 1335   MONOABS 1.1 (H) 04/10/2020 0914   EOSABS 9  (L) 12/12/2020 1514   EOSABS 0.0 06/20/2019 1335   BASOSABS 18 12/12/2020 1514   BASOSABS 0.0 06/20/2019 1335    Lab Results  Component Value Date   VD25OH 58 12/12/2020    T-score: -4.2  Assessment/Plan:   Administrations This Visit     denosumab (PROLIA) injection 60 mg     Admin Date 01/01/2021 Action Given Dose 60 mg Route Subcutaneous Administered By Carole Binning, LPN             Patient tolerated injection well.    All questions encouraged and answered.  Instructed patient to call with any further questions or concerns.

## 2021-01-07 ENCOUNTER — Telehealth: Payer: Self-pay | Admitting: Pharmacist

## 2021-01-07 ENCOUNTER — Telehealth: Payer: Self-pay

## 2021-01-07 NOTE — Telephone Encounter (Signed)
Advised patient that she can stop by the office to pick up a sample of rinvoq. Patient verbalized understanding and rinvoq has been reserved in the sample cabinet.

## 2021-01-07 NOTE — Telephone Encounter (Signed)
Patient left a voicemail stating she only has another week of [name] remaining and requested a return call to let her know if she can stop by the office and pick up another month.

## 2021-01-07 NOTE — Progress Notes (Addendum)
Chronic Care Management Pharmacy Assistant   Name: Sherri [name]  MRN: [id number] DOB: [birth year]   Reason for Encounter: Disease State   Conditions to be addressed/monitored: HTN   Recent office visits:  12/19/20 Dr. Quay Burow (PCP)  Reason:Senile purpura   Recent consult visits:  11/28/20 Dr. Petra Kuba, Rheumatology Reason:Rheumatoid arthritis involving multiple sites with positive rheumatoid factor   Hospital visits:  None in previous 6 months  Medications: Outpatient Encounter Medications as of 01/07/2021  Medication Sig   atorvastatin (LIPITOR) 10 MG tablet TAKE 1 TAB ON MONDAYS AND FRIDAY   Biotin 5000 MCG TABS Take 5,000 mcg by mouth daily.   Calcium Carbonate-Vitamin D (CALCIUM + D PO) Take 1 tablet by mouth daily.    Carboxymethylcellul-Glycerin (LUBRICATING EYE DROPS OP) Apply 1 drop to eye daily as needed (dry eyes).   carvedilol (COREG) 6.25 MG tablet Take 1 tablet (6.25 mg total) by mouth 2 (two) times daily with a meal.   Cholecalciferol (VITAMIN D3) 2000 units capsule Take 2,000 Units by mouth daily.   Cyanocobalamin (VITAMIN B-12 PO) Take 1 tablet by mouth daily.   denosumab (PROLIA) 60 MG/ML SOSY injection Inject 60 mg into the skin every 6 (six) months. Deliver to clinic: [address], [address], [place], [place] [address]   diazepam (VALIUM) 5 MG tablet Take 1 tablet (5 mg total) by mouth at bedtime. (Patient taking differently: Take 5 mg by mouth as needed.)   diclofenac sodium (VOLTAREN) 1 % GEL APPLY 3 GRAMS TO 3 LARGE JOINTS 3 TIMES A DAY AS NEEDED   famotidine (PEPCID) 40 MG tablet Take 1 tablet (40 mg total) by mouth 2 (two) times daily.   folic acid (FOLVITE) 1 MG tablet Take 2 tablets (2 mg total) by mouth daily.   furosemide (LASIX) 20 MG tablet TAKE 1 TABLET BY MOUTH EVERY DAY   methotrexate 50 MG/2ML injection INJECT 0.6ML INTO THE SKIN ONCE WEEKLY.   predniSONE (DELTASONE) 5 MG tablet TAKE 1 TABLET BY MOUTH EVERY DAY WITH BREAKFAST    psyllium (METAMUCIL) 58.6 % powder Take 1 packet by mouth as needed.   Pyridoxine HCl (VITAMIN B-6 PO) Take 1 tablet by mouth daily.   traMADol (ULTRAM) 50 MG tablet Take 1 tablet (50 mg total) by mouth 2 (two) times daily. Must keep follow up 11/20/20 for future refills   TUBERCULIN SYR 1CC/27GX1/2" 27G X 1/2" 1 ML MISC Use 1 syringe once weekly to inject methotrexate.   Upadacitinib ER 15 MG TB24 TAKE 1 TABLET (15 MG) BY MOUTH DAILY.   No facility-administered encounter medications on file as of 01/07/2021.    Care Gaps:  Recent Office Vitals: BP Readings from Last 3 Encounters:  01/01/21 99/61  12/19/20 118/70  11/28/20 119/77   Pulse Readings from Last 3 Encounters:  01/01/21 77  12/19/20 67  11/28/20 69    Wt Readings from Last 3 Encounters:  12/19/20 117 lb 9.6 oz (53.3 kg)  11/28/20 118 lb (53.5 kg)  11/20/20 119 lb 6.4 oz (54.2 kg)     Kidney Function Lab Results  Component Value Date/Time   CREATININE 0.91 12/12/2020 03:14 PM   CREATININE 0.97 (H) 09/26/2020 03:36 PM   GFR 49.33 (L) 04/10/2020 09:14 AM   GFRNONAA 56 (L) 09/26/2020 03:36 PM   GFRAA 65 09/26/2020 03:36 PM    BMP Latest Ref Rng & Units 12/12/2020 09/26/2020 06/27/2020  Glucose 65 - 99 mg/dL 98 74 94  BUN 7 - 25 mg/dL 10 10  15  Creatinine 0.60 - 1.00 mg/dL 0.91 0.97(H) 0.86  BUN/Creat Ratio 6 - 22 (calc) NOT APPLICABLE 10 NOT APPLICABLE  Sodium A999333 - 146 mmol/L 137 139 140  Potassium 3.5 - 5.3 mmol/L 4.7 4.7 4.9  Chloride 98 - 110 mmol/L 101 104 101  CO2 20 - 32 mmol/L '31 27 29  '$ Calcium 8.6 - 10.4 mg/dL 9.1 9.0 10.0     Contacted patient on 01/07/21 to discuss hypertension disease state  Current antihypertensive regimen:  Carvedilol 6.25 mg 1 tab 2 times daily  Patient verbally confirms she is taking the above medications as directed. Yes  How often are you checking your Blood Pressure? when feeling symptomatic, patient states that when she thinks about it she will take her blood pressure.    she checks her blood pressure in the morning before taking her medication.  Current home BP readings: 99/61 at Rheumatology  Any readings above 180/120? No If yes any symptoms of hypertensive emergency? patient denies any symptoms of high blood pressure   What recent interventions/DTPs have been made by any provider to improve Blood Pressure control since last CPP Visit: none noted  Any recent hospitalizations or ED visits since last visit with CPP? No  What diet changes have been made to improve Blood Pressure Control?  Patient states that she has not made any changes to her diet  What exercise is being done to improve your Blood Pressure Control?  Patient has not made any changes to exercise  Adherence Review: Is the patient currently on ACE/ARB medication? No Does the patient have >5 day gap between last estimated fill dates? No   Star Rating Drugs:  Medication:  Last Fill: Day Supply Atorvastatin 10 mg  11/29/20   84   Care Gaps: Last annual wellness visit:08/10/20   No appointments scheduled within the next 30 days.   [place] Pharmacist Assistant [phone]   Time spent:36

## 2021-01-10 ENCOUNTER — Ambulatory Visit: Payer: Medicare Other | Admitting: Physician Assistant

## 2021-01-10 ENCOUNTER — Other Ambulatory Visit: Payer: Self-pay

## 2021-01-10 DIAGNOSIS — Z1283 Encounter for screening for malignant neoplasm of skin: Secondary | ICD-10-CM | POA: Diagnosis not present

## 2021-01-10 DIAGNOSIS — D485 Neoplasm of uncertain behavior of skin: Secondary | ICD-10-CM

## 2021-01-10 DIAGNOSIS — L570 Actinic keratosis: Secondary | ICD-10-CM | POA: Diagnosis not present

## 2021-01-10 DIAGNOSIS — Q828 Other specified congenital malformations of skin: Secondary | ICD-10-CM | POA: Diagnosis not present

## 2021-01-10 NOTE — Telephone Encounter (Signed)
Medication Samples have been provided to the patient.  Drug name: [name]       Strength: 15 mg        Qty: 2  LOT: [id number]  Exp.Date: 07/27/2022  Dosing instructions: Take one tablet by mouth daily.

## 2021-01-10 NOTE — Patient Instructions (Signed)

## 2021-01-16 DIAGNOSIS — Z1231 Encounter for screening mammogram for malignant neoplasm of breast: Secondary | ICD-10-CM | POA: Diagnosis not present

## 2021-01-16 DIAGNOSIS — Z6821 Body mass index (BMI) 21.0-21.9, adult: Secondary | ICD-10-CM | POA: Diagnosis not present

## 2021-01-23 ENCOUNTER — Telehealth: Payer: Self-pay | Admitting: Internal Medicine

## 2021-01-23 MED ORDER — DIAZEPAM 5 MG PO TABS: 5.0000 mg | ORAL_TABLET | Freq: Every day | ORAL | 5 refills | Status: DC

## 2021-01-23 MED ORDER — TRAMADOL HCL 50 MG PO TABS: 50.0000 mg | ORAL_TABLET | Freq: Two times a day (BID) | ORAL | 2 refills | Status: DC

## 2021-01-23 NOTE — Telephone Encounter (Signed)
Patient requesting refill for  traMADol (ULTRAM) 50 MG tablet  diazepam (VALIUM) 5 MG tablet   Pharmacy [id number]- [place], [place]

## 2021-01-24 DIAGNOSIS — R3914 Feeling of incomplete bladder emptying: Secondary | ICD-10-CM | POA: Diagnosis not present

## 2021-01-24 DIAGNOSIS — N3020 Other chronic cystitis without hematuria: Secondary | ICD-10-CM | POA: Diagnosis not present

## 2021-01-25 ENCOUNTER — Encounter: Payer: Self-pay | Admitting: Internal Medicine

## 2021-01-26 ENCOUNTER — Encounter: Payer: Self-pay | Admitting: Internal Medicine

## 2021-01-31 ENCOUNTER — Encounter: Payer: Self-pay | Admitting: Physician Assistant

## 2021-01-31 NOTE — Progress Notes (Signed)
   Follow-Up Visit   Subjective  Sherri [name] is a 80 y.o. female who presents for the following: Annual Exam (Here for 3 month check up. Concerns right lower leg. ). Healing well per patient.   The following portions of the chart were reviewed this encounter and updated as appropriate:  Tobacco  Allergies  Meds  Problems  Med Hx  Surg Hx  Fam Hx      Objective  Well appearing patient in no apparent distress; mood and affect are within normal limits.  A full examination was performed including scalp, head, eyes, ears, nose, lips, neck, chest, axillae, abdomen, back, buttocks, bilateral upper extremities, bilateral lower extremities, hands, feet, fingers, toes, fingernails, and toenails. All findings within normal limits unless otherwise noted below.  Right Lower Leg - Anterior Hyperkeratotic scale with pink base      Chest - Medial [facility]), Left Forearm - Posterior (3) Erythematous patches with gritty scale.   Assessment & Plan  Neoplasm of uncertain behavior of skin Right Lower Leg - Anterior  Skin / nail biopsy Type of biopsy: tangential   Informed consent: discussed and consent obtained   Timeout: patient name, date of birth, surgical site, and procedure verified   Anesthesia: the lesion was anesthetized in a standard fashion   Anesthetic:  1% lidocaine w/ epinephrine 1-100,000 local infiltration Instrument used: flexible razor blade   Hemostasis achieved with: ferric subsulfate   Outcome: patient tolerated procedure well   Post-procedure details: wound care instructions given    Specimen 1 - Surgical pathology Differential Diagnosis: scc vs bcc  Check Margins: No  AK (actinic keratosis) (4) Left Forearm - Posterior (3); Chest - Medial (Center)  Destruction of lesion - Chest - Medial (Center), Left Forearm - Posterior Complexity: simple   Destruction method: cryotherapy   Informed consent: discussed and consent obtained   Timeout:  patient name,  date of birth, surgical site, and procedure verified Lesion destroyed using liquid nitrogen: Yes   Cryotherapy cycles:  1 Outcome: patient tolerated procedure well with no complications   Post-procedure details: wound care instructions given      I, Amarius Toto, PA-C, have reviewed all documentation's for this visit.  The documentation on 01/31/21 for the exam, diagnosis, procedures and orders are all accurate and complete.

## 2021-02-06 ENCOUNTER — Telehealth: Payer: Self-pay

## 2021-02-06 NOTE — Telephone Encounter (Signed)
Patient advised she may come to the office to get a sample of [name].

## 2021-02-06 NOTE — Telephone Encounter (Signed)
Patient called requesting prescription refill of [name].

## 2021-02-06 NOTE — Telephone Encounter (Signed)
Attempted to contact the patient and left message for patient to call the office.  

## 2021-02-07 NOTE — Telephone Encounter (Signed)
Medication Samples have been provided to the patient.  Drug name: [name]       Strength: 15 mg        Qty: 2 LOT: [id number]   Exp.Date: 09/05/2022  Dosing instructions: Take on tablet by mouth daily.

## 2021-02-09 ENCOUNTER — Encounter: Payer: Self-pay | Admitting: Internal Medicine

## 2021-02-12 ENCOUNTER — Other Ambulatory Visit: Payer: Self-pay | Admitting: Rheumatology

## 2021-02-12 NOTE — Telephone Encounter (Signed)
Next Visit: 02/27/2021  Last Visit: 11/28/2020  Last Fill: 11/14/2020  DX: Rheumatoid arthritis involving multiple sites with positive rheumatoid factor   Current Dose per office note 11/28/2020: Methotrexate 0.6 ml sq injections once weekly  Labs: 12/12/2020 Alk phos remains low but stable.  AST and ALT are borderline elevated. Rest of CMP WNL.  CBC stable.  We will continue to monitor lab work closely.   Okay to refill MTX?

## 2021-02-13 NOTE — Progress Notes (Signed)
Office Visit Note  Patient: Sherri [name]             Date of Birth: Oct 12, 1940           MRN: [id number]             PCP: Binnie Rail, MD Referring: Binnie Rail, MD Visit Date: 02/27/2021 Occupation: @GUAROCC @  Subjective:  Chronic neck pain   History of Present Illness: Sherri [name] is a 80 y.o. female with history of seropositive rheumatoid arthritis, osteoarthritis, and osteoporosis.  She is currently taking rinvoq 15 mg 1 tablet by mouth daily, methotrexate 0.6 ml sq injections once weekly, and folic acid 2 mg daily.  She remains on prednisone 5 mg daily.  Patient reports that this is the best her hands and wrist joints have felt.  She denies any joint swelling at this time.  She continues to have chronic neck and lower back pain but denies any symptoms of radiculopathy.  She states that she experiences aching in the left hip which is replaced first thing in the morning for several hours.  Overall she has found combination therapy to be effective at managing her rheumatoid arthritis.  She states that last week she started to experience congestion, headache, and a cough.  She did not have any fevers.  She tested negative for COVID-19.  She does Robitussin over-the-counter for symptomatic relief.  Her symptoms have significantly improved this week.  She states that she held her rinvoq and methotrexate last week.  She denies any other recent infections. She has had the flu shot and is planning on having the updated covid-19 booster.  She is up to date on shingrix and pneumonia vaccine.  Her last Prolia injection was on 01/01/2021.  She continues to take a calcium and vitamin D supplement daily.  She denies any recent falls or fractures since her last office visit.  Activities of Daily Living:  Patient reports morning stiffness for several hours.   Patient Reports nocturnal pain.  Difficulty dressing/grooming: Denies Difficulty climbing stairs: Denies Difficulty getting out of  chair: Denies Difficulty using hands for taps, buttons, cutlery, and/or writing: Reports  Review of Systems  Constitutional:  Positive for fatigue.  HENT:  Positive for mouth dryness and nose dryness. Negative for mouth sores.   Eyes:  Positive for dryness. Negative for pain and itching.  Respiratory:  Negative for shortness of breath and difficulty breathing.   Cardiovascular:  Negative for chest pain and palpitations.  Gastrointestinal:  Positive for constipation. Negative for blood in stool and diarrhea.  Endocrine: Negative for increased urination.  Genitourinary:  Negative for difficulty urinating.  Musculoskeletal:  Positive for joint pain, joint pain and morning stiffness. Negative for joint swelling, myalgias, muscle tenderness and myalgias.  Skin:  Negative for color change, rash and redness.  Allergic/Immunologic: Negative for susceptible to infections.  Neurological:  Positive for dizziness and headaches. Negative for numbness and memory loss.  Hematological:  Positive for bruising/bleeding tendency.  Psychiatric/Behavioral:  Negative for confusion.    PMFS History:  Patient Active Problem List   Diagnosis Date Noted   Senile purpura ([place]) 12/19/2020   Restless leg 02/02/2020   Bilateral leg edema 01/11/2020   Cervical radiculopathy 06/28/2019   Carpal tunnel syndrome on both sides 06/28/2019   Right sided sciatica 04/14/2018   Trochanteric bursitis of right hip 04/14/2018   S/P cervical spinal fusion 11/10/2017   Dysphagia 10/26/2017   Gastroesophageal reflux disease 08/06/2017   Easy bruising  08/06/2017   Mid back pain 03/26/2017   Submandibular gland swelling 01/08/2017   High risk medication use 09/02/2016   Piriformis muscle pain 09/02/2016   Right hip pain 04/08/2016   Right knee pain 02/25/2016   Numbness 11/14/2014   Biceps tendon tear 10/10/2014   Insomnia 10/10/2014   Gait disturbance 06/13/2014   OP (osteoporosis) 01/18/2014   Other long term  (current) drug therapy 01/18/2014   Hoarseness 12/29/2013   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 01/01/2013   Personal history of colonic polyps 07/03/2010   Nocturia 05/28/2010   THYROID NODULE 05/17/2008   Multiple sclerosis ([place]) 05/17/2008   Other fatigue 03/30/2007   Hyperlipidemia 08/20/2006   Essential hypertension 08/20/2006   Rheumatoid arthritis ([place]) 08/20/2006   CERVICAL CANCER, HX OF 08/20/2006   GASTRIC ULCER, HX OF 08/20/2006    Past Medical History:  Diagnosis Date   Anxiety    takes Valium daily as needed   Basal cell carcinoma 01/21/1988   left nostril (MOHS), sup-right calf (CX35FU)   Basal cell carcinoma 03/22/1996   right calf (CX35FU)   Basal cell carcinoma 03/31/1995   left wing nose   Bruises easily    d/t meds   Cancer ([facility])    basal cell ca, in situ- uterine    Cataracts, bilateral    removed bilateral   Chronic back pain    Dizziness    r/t to meds   GERD (gastroesophageal reflux disease)    no meds on a regular basis but will take Tums if needed   Headache(784.0)    r/t neck issues   History of bronchitis 6-61yrs ago   History of colon polyps    benign   Hyperlipidemia    takes Atorvastatin on Mondays and Fridays   Hypertension    takes Ramipril daily   Joint pain    Joint swelling    Multiple sclerosis ([place])    Neuromuscular disorder ([place])    Dr. Park Liter- [facility], follows M.S.   Nocturia    PONV (postoperative nausea and vomiting)    trouble urinating after surgery in 2014   Postoperative nausea and vomiting 01/11/2019   Rheumatoid arthritis ([place])    Dr Jani Gravel weekly, RA- hands- knees- feet    Rheumatoid arthritis(714.0)    Dr Estanislado Pandy takes [name] daily   Right wrist fracture    Skin cancer    Squamous cell carcinoma of skin 11/02/2001   in situ-right knee (cx84fu)   Squamous cell carcinoma of skin 11/12/2011   in situ-left forearm (CX35FU), in situ-left foot (CX35FU)   Squamous cell carcinoma of  skin 01/22/2012   in situ-left lower forearm (txpbx)   Squamous cell carcinoma of skin 11/17/2012   right shin (txpbx)   Squamous cell carcinoma of skin 10/28/2013   in situ-right shoulder (CX35FU)   Squamous cell carcinoma of skin 04/28/2014   well diff-right shin (txpbx)   Squamous cell carcinoma of skin 11/02/2014   in situ-Left shin (txpbx)   Squamous cell carcinoma of skin 12/15/2014   in situ-Left hand (txpbx), bowens-left side chest (txpbx)   Squamous cell carcinoma of skin 05/09/2015   in situ-left hand (txpbx)    Squamous cell carcinoma of skin 05/07/2016   in situ-left inner shin,ant, in situ-left inner shin, post, in situ-left outer forearm, in situ-right knuckle   Squamous cell carcinoma of skin 03/1302018   in situ-left shoulder (txpbx), in situ-right inner shin (txpbx), in situ-top of left foot (txpbx), KA- left forearm (  txpbx)   Squamous cell carcinoma of skin 12/02/2016   in situ-left inner shin (txpbx)   Squamous cell carcinoma of skin 03/04/2017   in situ-left outer sup, shin (txpbx), in situ- right 2nd knuckle finger (txpbx), in situ- Left wrist (txpbx)   Squamous cell carcinoma of skin 04/06/2018   in situ-above left knee inner (txpbx)   Squamous cell carcinoma of skin 04/21/2018   in situ-right top hand (txpbx)   Squamous cell carcinoma of skin 07/08/2018   in situ-right lower inner shin (txpbx)   Squamous cell carcinoma of skin 04/27/2019   in situ- Left neck(CX35FU), In situ- right neck (CX35FU)   Urinary retention    sees Dr.Wrenn about 2 times a yr    Family History  Problem Relation Age of Onset   Cancer Mother        pancreatic   Diabetes Mother    Pancreatic cancer Mother    Heart disease Father        Rheumatic   Cancer Father        ? stomach   Stomach cancer Father    Cancer Sister        stomach   Stomach cancer Sister    Cancer Maternal Aunt        X 4; ? primary   Heart disease Paternal Aunt    Cancer Maternal Grandmother         cervical   Colon cancer Other        Aunts   Multiple sclerosis Daughter    Hypertension Neg Hx    Stroke Neg Hx    Esophageal cancer Neg Hx    Rectal cancer Neg Hx    Past Surgical History:  Procedure Laterality Date   ABDOMINAL HYSTERECTOMY     1985   APPENDECTOMY     with TAH   BACK SURGERY     several   BREAST BIOPSY Right    Results were negative   BROW LIFT Bilateral 10/02/2016   Procedure: BILATERAL LOWER LID BLEPHAROPLASTY;  Surgeon: Clista Bernhardt, MD;  Location: [facility];  Service: Plastics;  Laterality: Bilateral;   CARPAL TUNNEL RELEASE Right    cataracts     CERVICAL FUSION     Dr Joya Salm   CERVICAL FUSION  12/31/2012   Dr Joya Salm   CHEST TUBE INSERTION     for traumatic Pneumothorax   COLONOSCOPY  07/16/2010   normal    COLONOSCOPY W/ POLYPECTOMY  1997   negative since; Dr Deatra Ina   ESOPHAGOGASTRODUODENOSCOPY  07/16/2010   normal   eye lid raise     EYE SURGERY Bilateral    cataracts removed - /w IOL   JOINT REPLACEMENT     LUMBAR FUSION     Dr Joya Salm   NASAL SINUS SURGERY     POSTERIOR CERVICAL FUSION/FORAMINOTOMY N/A 12/31/2012   Procedure: POSTERIOR LATERAL CERVICAL FUSION/FORAMINOTOMY LEVEL 1 CERVICAL THREE-FOUR WITH LATERAL MASS SCREWS;  Surgeon: Floyce Stakes, MD;  Location: [facility];  Service: Neurosurgery;  Laterality: N/A;   PTOSIS REPAIR Bilateral 10/02/2016   Procedure: INTERNAL PTOSIS REPAIR;  Surgeon: Clista Bernhardt, MD;  Location: [facility];  Service: Plastics;  Laterality: Bilateral;   SKIN BIOPSY     THYROID SURGERY     R lobe removed- 1972, has grown back - CT last done- 2018   TOTAL HIP ARTHROPLASTY  12/22/2011   Procedure: TOTAL HIP ARTHROPLASTY;  Surgeon: Kerin Salen, MD;  Location: [facility];  Service: Orthopedics;  Laterality: Left;   TOTAL SHOULDER ARTHROPLASTY     TUBAL LIGATION     UPPER GASTROINTESTINAL ENDOSCOPY  2012   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   Social History   Social History Narrative   Not on file    Immunization History  Administered Date(s) Administered   Fluad Quad(high Dose 65+) 02/02/2020   Influenza Whole 02/22/2007, 02/08/2013   Influenza, High Dose Seasonal PF 02/04/2015, 02/18/2017, 02/21/2018, 01/16/2019, 01/25/2021   Influenza-Unspecified 01/24/2014, 01/25/2016, 02/18/2017, 01/16/2019, 01/25/2020   PFIZER(Purple Top)SARS-COV-2 Vaccination 06/18/2019, 07/09/2019, 01/09/2020, 07/03/2020, 02/07/2021   Pfizer Covid-19 Vaccine Bivalent Booster 33yrs & up 02/08/2021   Pneumococcal Conjugate-13 11/23/2012, 09/08/2014   Pneumococcal Polysaccharide-23 05/26/2006   Pneumococcal-Unspecified 12/28/2012, 06/09/2020   Td 05/28/2010   Tdap 12/24/2020, 01/07/2021   Unspecified SARS-COV-2 Vaccination 06/26/2020   Varicella 11/07/2020   Zoster Recombinat (Shingrix) 02/10/2019, 04/14/2019   Zoster, Live 05/26/2009, 07/10/2020     Objective: Vital Signs: BP 121/78 (BP Location: Left Arm, Patient Position: Sitting, Cuff Size: Small)   Pulse (!) 59   Resp 12   Ht 5\' 3"  (1.6 m)   Wt 119 lb (54 kg)   BMI 21.08 kg/m    Physical Exam Vitals and nursing note reviewed.  Constitutional:      Appearance: She is well-developed.  HENT:     Head: Normocephalic and atraumatic.  Eyes:     Conjunctiva/sclera: Conjunctivae normal.  Pulmonary:     Effort: Pulmonary effort is normal.  Abdominal:     Palpations: Abdomen is soft.  Musculoskeletal:     Cervical back: Normal range of motion.  Skin:    General: Skin is warm and dry.     Capillary Refill: Capillary refill takes less than 2 seconds.  Neurological:     Mental Status: She is alert and oriented to person, place, and time.  Psychiatric:        Behavior: Behavior normal.     Musculoskeletal Exam: C-spine has limited ROM with lateral rotation, especially to the left.  Painful ROM of lumbar spine.  Some coccyx pain.  Shoulders, elbow joints, and wrist joints have good ROM with no synovitis.  PIP and DIP thickening consistent with OA  of both hands.  CMC joint prominence.  Left hip replacement has good ROM.  Right hip joint has good ROM.  Both knee joints have good ROM with no warmth or effusion.  Ankle joints have good ROM with no tenderness or joint swelling.  No tenderness over MTP joints.  Dorsal spur noted on right foot.   CDAI Exam: CDAI Score: 0.6  Patient Global: 3 mm; Provider Global: 3 mm Swollen: 0 ; Tender: 0  Joint Exam 02/27/2021   No joint exam has been documented for this visit   There is currently no information documented on the homunculus. Go to the Rheumatology activity and complete the homunculus joint exam.  Investigation: No additional findings.  Imaging: No results found.  Recent Labs: Lab Results  Component Value Date   WBC 4.5 12/12/2020   HGB 12.2 12/12/2020   PLT 305 12/12/2020   NA 137 12/12/2020   K 4.7 12/12/2020   CL 101 12/12/2020   CO2 31 12/12/2020   GLUCOSE 98 12/12/2020   BUN 10 12/12/2020   CREATININE 0.91 12/12/2020   BILITOT 0.5 12/12/2020   ALKPHOS 29 (L) 04/10/2020   AST 37 (H) 12/12/2020   ALT 31 (H) 12/12/2020   PROT 6.4 12/12/2020   ALBUMIN 4.2 04/10/2020  CALCIUM 9.1 12/12/2020   GFRAA 65 09/26/2020   QFTBGOLDPLUS NEGATIVE 09/26/2020    Speciality Comments: Prior therapy includes: [name] (cost), Olumiant-inadequate response, Imuran weakness, leflunomide-intolerance Prolia: 05/04/18, 11/12/18, 06/09/19, 12/07/19, 07/05/20  Procedures:  No procedures performed Allergies: Darvon [propoxyphene hcl], Meperidine hcl, Penicillins, Azathioprine, Sulfa antibiotics, Atorvastatin, Demerol [meperidine], Leflunomide, and Ezetimibe-simvastatin      Assessment / Plan:     Visit Diagnoses: Rheumatoid arthritis involving multiple sites with positive rheumatoid factor ([place]) - She has no joint tenderness or synovitis on examination today.  She has not had any recent rheumatoid arthritis flares.  Her rheumatoid arthritis has been well controlled taking [name] 15 mg 1  tablet by mouth daily, methotrexate 0.6 mL sq injections once weekly, and prednisone 5 mg 1 tablet daily.  According to the patient this is the best her hands and wrists have felt since being diagnosed.  She has some synovial thickening over the MCP joints but no tenderness or inflammation was noted.  She was able to make a complete fist bilaterally.  Discussed the importance of joint protection and muscle strengthening.  She will remain on combination therapy as prescribed.  Samples of rinvoq were provided to the patient today. She was advised to notify us if she develops increased joint pain or joint swelling.  She will follow-up in the office in 3 months.  High risk medication use - [name] 15 mg 1 tablet by mouth daily, Methotrexate 0.6 ml sq injections once weekly, folic acid 1 mg p.o, prednisone 5 mg 1 tablet by mouth daily. She previously had an inadequate response of xeljanz, oluminant.  CBC and CMP updated on 12/12/20.  CBC and CMP will be updated today.  Orders were released.  Her next lab work will be due in January and every 3 months to monitor for drug toxicity.  TB gold negative on 09/26/20.   - Plan: COMPLETE METABOLIC PANEL WITH GFR, CBC with Differential/Platelet She had symptoms consistent with a viral URI last week and held rinvoq and methotrexate during that time.  Discussed the importance of holding both medications that she develops signs or symptoms of an infection and to resume once the infection has completely cleared.  She voiced understanding. She is aware of the black box warning for increased risk of MACE and malignancy in patients on Jak inhibitors including rinvoq.  Discussed the importance of modifying risk factors.  Her blood pressure was well controlled in the office today: 121/78.  Lipid panel was within normal limits on 08/23/2020.  Future order for lipid panel will be placed today to be drawn when the patient is fasting.   Age-related osteoporosis without current pathological  fracture - DEXA: 01/16/2020 T-score: -4.2. Hx of stress fractures. Most recent fracture: 07/31/20 nondisplaced proximal fifth metatarsal fracture, incomplete. She has not had any recent falls. She remains on biannual prolia injections for management of osteoporosis.  Her most recent injection was on 01/01/21.  She continues to take a calcium and vitamin D supplement daily. Due to update DEXA August 2023.    Vitamin D deficiency: She is taking a calcium and vitamin D supplement daily.   Primary osteoarthritis of right hip: She has good ROM with no discomfort at this time.   History of total hip replacement, left - Dr. Mayer Camel.  She experiences aching and stiffness in the left hip which is replaced on a daily basis worse first thing in the morning lasting several hours.  On examination today she has slightly limited range of motion  with no discomfort.  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: She has limited ROM with lateral rotation especially to the left.  She has chronic neck pain and stiffness but no symptoms of radiculopathy at this time.   DDD (degenerative disc disease), lumbar: Chronic pain. She has been experiencing coccyx pain when sitting for prolonged periods of time. She is not experiencing any symptoms of radiculopathy or paresthesias.   Closed fracture of right wrist with routine healing, subsequent encounter: Healed.   Other fatigue: Stable.   Primary insomnia: She experiences occasional nocturnal pain which contributes to her insomnia.   Other medical conditions are listed as follows:   History of multiple sclerosis ([place]) - She is not a good candidate for TNF inhibitors.   History of anxiety  History of depression  History of hypertension: BP was 121/78 today in the office.  Discussed the importance of close blood pressure monitoring.   History of hyperlipidemia  Orders: Orders Placed This Encounter  Procedures   COMPLETE METABOLIC PANEL WITH GFR   CBC with  Differential/Platelet   Lipid panel   No orders of the defined types were placed in this encounter.     Follow-Up Instructions: Return in about 3 months (around 05/30/2021) for Rheumatoid arthritis, Osteoarthritis, Osteoporosis.   Ofilia Neas, PA-C  Note - This record has been created using Dragon software.  Chart creation errors have been sought, but may not always  have been located. Such creation errors do not reflect on  the standard of medical care.

## 2021-02-19 ENCOUNTER — Other Ambulatory Visit: Payer: Self-pay | Admitting: Internal Medicine

## 2021-02-27 ENCOUNTER — Other Ambulatory Visit: Payer: Self-pay

## 2021-02-27 ENCOUNTER — Ambulatory Visit: Payer: Medicare Other | Admitting: Physician Assistant

## 2021-02-27 ENCOUNTER — Encounter: Payer: Self-pay | Admitting: Physician Assistant

## 2021-02-27 VITALS — BP 121/78 | HR 59 | Resp 12 | Ht 63.0 in | Wt 119.0 lb

## 2021-02-27 DIAGNOSIS — Z79899 Other long term (current) drug therapy: Secondary | ICD-10-CM | POA: Diagnosis not present

## 2021-02-27 DIAGNOSIS — R5383 Other fatigue: Secondary | ICD-10-CM | POA: Diagnosis not present

## 2021-02-27 DIAGNOSIS — F5101 Primary insomnia: Secondary | ICD-10-CM

## 2021-02-27 DIAGNOSIS — M51369 Other intervertebral disc degeneration, lumbar region without mention of lumbar back pain or lower extremity pain: Secondary | ICD-10-CM

## 2021-02-27 DIAGNOSIS — E559 Vitamin D deficiency, unspecified: Secondary | ICD-10-CM | POA: Diagnosis not present

## 2021-02-27 DIAGNOSIS — M5136 Other intervertebral disc degeneration, lumbar region: Secondary | ICD-10-CM | POA: Diagnosis not present

## 2021-02-27 DIAGNOSIS — M1611 Unilateral primary osteoarthritis, right hip: Secondary | ICD-10-CM | POA: Diagnosis not present

## 2021-02-27 DIAGNOSIS — M0579 Rheumatoid arthritis with rheumatoid factor of multiple sites without organ or systems involvement: Secondary | ICD-10-CM | POA: Diagnosis not present

## 2021-02-27 DIAGNOSIS — M4312 Spondylolisthesis, cervical region: Secondary | ICD-10-CM | POA: Diagnosis not present

## 2021-02-27 DIAGNOSIS — Z8679 Personal history of other diseases of the circulatory system: Secondary | ICD-10-CM

## 2021-02-27 DIAGNOSIS — Z96642 Presence of left artificial hip joint: Secondary | ICD-10-CM | POA: Diagnosis not present

## 2021-02-27 DIAGNOSIS — G35 Multiple sclerosis: Secondary | ICD-10-CM | POA: Diagnosis not present

## 2021-02-27 DIAGNOSIS — S62101D Fracture of unspecified carpal bone, right wrist, subsequent encounter for fracture with routine healing: Secondary | ICD-10-CM | POA: Diagnosis not present

## 2021-02-27 DIAGNOSIS — Z8639 Personal history of other endocrine, nutritional and metabolic disease: Secondary | ICD-10-CM

## 2021-02-27 DIAGNOSIS — M810 Age-related osteoporosis without current pathological fracture: Secondary | ICD-10-CM

## 2021-02-27 DIAGNOSIS — Z8659 Personal history of other mental and behavioral disorders: Secondary | ICD-10-CM

## 2021-02-27 NOTE — Patient Instructions (Signed)
Standing Labs We placed an order today for your standing lab work.   Please have your standing labs drawn in January and every 3 months   If possible, please have your labs drawn 2 weeks prior to your appointment so that the provider can discuss your results at your appointment.  Please note that you may see your imaging and lab results in MyChart before we have reviewed them. We may be awaiting multiple results to interpret others before contacting you. Please allow our office up to 72 hours to thoroughly review all of the results before contacting the office for clarification of your results.  We have open lab daily: Monday through Thursday from 1:30-4:30 PM and Friday from 1:30-4:00 PM at the office of Dr. Shaili Deveshwar, [facility].   Please be advised, all patients with office appointments requiring lab work will take precedent over walk-in lab work.  If possible, please come for your lab work on Monday and Friday afternoons, as you may experience shorter wait times. The office is located at [address], [address], [place], [place] [address] No appointment is necessary.   Labs are drawn by [facility]. Please bring your co-pay at the time of your lab draw.  You may receive a bill from [facility] for your lab work.  If you wish to have your labs drawn at another location, please call the office 24 hours in advance to send orders.  If you have any questions regarding directions or hours of operation,  please call [phone].   As a reminder, please drink plenty of water prior to coming for your lab work. Thanks!  

## 2021-02-28 LAB — COMPLETE METABOLIC PANEL WITH GFR
AG Ratio: 1.8 (calc) (ref 1.0–2.5)
ALT: 24 U/L (ref 6–29)
AST: 27 U/L (ref 10–35)
Albumin: 4.3 g/dL (ref 3.6–5.1)
Alkaline phosphatase (APISO): 33 U/L — ABNORMAL LOW (ref 37–153)
BUN: 13 mg/dL (ref 7–25)
CO2: 29 mmol/L (ref 20–32)
Calcium: 9.5 mg/dL (ref 8.6–10.4)
Chloride: 100 mmol/L (ref 98–110)
Creat: 0.96 mg/dL (ref 0.60–1.00)
Globulin: 2.4 g/dL (calc) (ref 1.9–3.7)
Glucose, Bld: 87 mg/dL (ref 65–99)
Potassium: 4.9 mmol/L (ref 3.5–5.3)
Sodium: 138 mmol/L (ref 135–146)
Total Bilirubin: 0.4 mg/dL (ref 0.2–1.2)
Total Protein: 6.7 g/dL (ref 6.1–8.1)
eGFR: 60 mL/min/{1.73_m2} (ref >=60)

## 2021-02-28 LAB — CBC WITH DIFFERENTIAL/PLATELET
Absolute Monocytes: 794 cells/uL (ref 200–950)
Basophils Absolute: 38 cells/uL (ref 0–200)
Basophils Relative: 0.6 %
Eosinophils Absolute: 19 cells/uL (ref 15–500)
Eosinophils Relative: 0.3 %
HCT: 37.2 % (ref 35.0–45.0)
Hemoglobin: 12.7 g/dL (ref 11.7–15.5)
Lymphs Abs: 1197 cells/uL (ref 850–3900)
MCH: 35.7 pg — ABNORMAL HIGH (ref 27.0–33.0)
MCHC: 34.1 g/dL (ref 32.0–36.0)
MCV: 104.5 fL — ABNORMAL HIGH (ref 80.0–100.0)
MPV: 9.7 fL (ref 7.5–12.5)
Monocytes Relative: 12.4 %
Neutro Abs: 4352 cells/uL (ref 1500–7800)
Neutrophils Relative %: 68 %
Platelets: 270 10*3/uL (ref 140–400)
RBC: 3.56 10*6/uL — ABNORMAL LOW (ref 3.80–5.10)
RDW: 13.4 % (ref 11.0–15.0)
Total Lymphocyte: 18.7 %
WBC: 6.4 10*3/uL (ref 3.8–10.8)

## 2021-02-28 NOTE — Progress Notes (Signed)
Alk phos is borderline low but has improved.  LFTs WNL.  Renal function WNL.  WBC count is WNL.  Hemoglobin and hematocrit are WNL.  MCV and MCH remain elevated but stable.

## 2021-03-05 ENCOUNTER — Telehealth: Payer: Self-pay

## 2021-03-05 NOTE — Progress Notes (Signed)
Chronic Care Management Pharmacy Assistant   Name: Sherri [name]  MRN: [id number] DOB: April 30, 1941   Reason for Encounter: Disease State   Conditions to be addressed/monitored: HTN  Recent office visits:  None ID  Recent consult visits:  02/27/21 Ofilia Neas, PA-C (Rheumatoid arthritis involving multiple sites with positive rheumatoid factor) labs ordered, no med changes 01/10/21 Warren Danes, PA-C-Dermatology (Annual Exam) orders: skin/nail biopsy, destruction of lesion, and surgical pathology, no med changes  Hospital visits:  None since last coordination call  Medications: Outpatient Encounter Medications as of 03/05/2021  Medication Sig   atorvastatin (LIPITOR) 10 MG tablet TAKE 1 TAB ON MONDAYS AND FRIDAY   Biotin 5000 MCG TABS Take 5,000 mcg by mouth daily.   Calcium Carbonate-Vitamin D (CALCIUM + D PO) Take 1 tablet by mouth daily.    Carboxymethylcellul-Glycerin (LUBRICATING EYE DROPS OP) Apply 1 drop to eye daily as needed (dry eyes).   carvedilol (COREG) 6.25 MG tablet Take 1 tablet (6.25 mg total) by mouth 2 (two) times daily with a meal.   Cholecalciferol (VITAMIN D3) 2000 units capsule Take 2,000 Units by mouth daily.   Cyanocobalamin (VITAMIN B-12 PO) Take 1 tablet by mouth daily.   denosumab (PROLIA) 60 MG/ML SOSY injection Inject 60 mg into the skin every 6 (six) months. Deliver to clinic: [address], [address], [place], [place] [address]   diazepam (VALIUM) 5 MG tablet Take 1 tablet (5 mg total) by mouth at bedtime.   diclofenac sodium (VOLTAREN) 1 % GEL APPLY 3 GRAMS TO 3 LARGE JOINTS 3 TIMES A DAY AS NEEDED   famotidine (PEPCID) 40 MG tablet Take 1 tablet (40 mg total) by mouth 2 (two) times daily.   folic acid (FOLVITE) 1 MG tablet Take 2 tablets (2 mg total) by mouth daily.   furosemide (LASIX) 20 MG tablet TAKE 1 TABLET BY MOUTH EVERY DAY   methenamine (HIPREX) 1 g tablet Take 1 g by mouth 2 (two) times daily.   methotrexate 50 MG/2ML  injection INJECT 0.6 ML INTO THE SKIN ONCE WEEKLY.   predniSONE (DELTASONE) 5 MG tablet TAKE 1 TABLET BY MOUTH EVERY DAY WITH BREAKFAST   psyllium (METAMUCIL) 58.6 % powder Take 1 packet by mouth as needed.   Pyridoxine HCl (VITAMIN B-6 PO) Take 1 tablet by mouth daily.   traMADol (ULTRAM) 50 MG tablet Take 1 tablet (50 mg total) by mouth 2 (two) times daily. Must keep follow up 11/20/20 for future refills   TUBERCULIN SYR 1CC/27GX1/2" 27G X 1/2" 1 ML MISC Use 1 syringe once weekly to inject methotrexate.   Upadacitinib ER 15 MG TB24 TAKE 1 TABLET (15 MG) BY MOUTH DAILY.   No facility-administered encounter medications on file as of 03/05/2021.   Reviewed chart prior to disease state call. Spoke with patient regarding BP  Recent Office Vitals: BP Readings from Last 3 Encounters:  02/27/21 121/78  01/01/21 99/61  12/19/20 118/70   Pulse Readings from Last 3 Encounters:  02/27/21 (!) 59  01/01/21 77  12/19/20 67    Wt Readings from Last 3 Encounters:  02/27/21 119 lb (54 kg)  12/19/20 117 lb 9.6 oz (53.3 kg)  11/28/20 118 lb (53.5 kg)     Kidney Function Lab Results  Component Value Date/Time   CREATININE 0.96 02/27/2021 01:46 PM   CREATININE 0.91 12/12/2020 03:14 PM   GFR 49.33 (L) 04/10/2020 09:14 AM   GFRNONAA 56 (L) 09/26/2020 03:36 PM   GFRAA 65 09/26/2020 03:36  PM    BMP Latest Ref Rng & Units 02/27/2021 12/12/2020 09/26/2020  Glucose 65 - 99 mg/dL 87 98 74  BUN 7 - 25 mg/dL 13 10 10   Creatinine 0.60 - 1.00 mg/dL 0.96 0.91 0.97(H)  BUN/Creat Ratio 6 - 22 (calc) NOT APPLICABLE NOT APPLICABLE 10  Sodium 256 - 146 mmol/L 138 137 139  Potassium 3.5 - 5.3 mmol/L 4.9 4.7 4.7  Chloride 98 - 110 mmol/L 100 101 104  CO2 20 - 32 mmol/L 29 31 27   Calcium 8.6 - 10.4 mg/dL 9.5 9.1 9.0    Current antihypertensive regimen:  Carvedilol 6.25 mg Furosemide 20 mg  How often are you checking your Blood Pressure? Patient reports that she only takes when feeling  symptomatic  Current home BP readings: Patient reports that her last reading was 121/72 at Dr. Estanislado Pandy office last week  What recent interventions/DTPs have been made by any provider to improve Blood Pressure control since last CPP Visit: None noted  Any recent hospitalizations or ED visits since last visit with CPP? No  What diet changes have been made to improve Blood Pressure Control?  Patient states that she has a very low appetite, but has not made any changes to her diet  What exercise is being done to improve your Blood Pressure Control?  Patient states that she has RA and MS so she does not do any physical exercise  Adherence Review: Is the patient currently on ACE/ARB medication? No Does the patient have >5 day gap between last estimated fill dates? No   Care Gaps: Colonoscopy-NA Diabetic Foot Exam-NA Mammogram-NA Ophthalmology-NA Dexa Scan - NA Annual Well Visit - 01/10/21 Micro albumin-NA Hemoglobin A1c- NA  Star Rating Drugs: Atorvastatin 10 mg-last fill 02/19/21 90 ds  Ethelene Hal Clinical Pharmacist Assistant [phone]

## 2021-03-09 ENCOUNTER — Other Ambulatory Visit: Payer: Self-pay | Admitting: Physician Assistant

## 2021-03-13 ENCOUNTER — Other Ambulatory Visit: Payer: Self-pay | Admitting: Rheumatology

## 2021-03-13 NOTE — Telephone Encounter (Signed)
Next Visit: 05/29/2021  Last Visit: 02/27/2021  Last Fill: 11/05/2020  Dx: Rheumatoid arthritis involving multiple sites with positive rheumatoid factor   Current Dose per office note on 03/13/2021: prednisone 5 mg 1 tablet daily  Okay to refill prednisone?

## 2021-03-21 DIAGNOSIS — N3020 Other chronic cystitis without hematuria: Secondary | ICD-10-CM | POA: Diagnosis not present

## 2021-03-21 DIAGNOSIS — R3914 Feeling of incomplete bladder emptying: Secondary | ICD-10-CM | POA: Diagnosis not present

## 2021-03-27 ENCOUNTER — Other Ambulatory Visit: Payer: Self-pay | Admitting: Internal Medicine

## 2021-03-30 ENCOUNTER — Other Ambulatory Visit: Payer: Self-pay

## 2021-03-30 ENCOUNTER — Encounter (HOSPITAL_BASED_OUTPATIENT_CLINIC_OR_DEPARTMENT_OTHER): Payer: Self-pay | Admitting: Emergency Medicine

## 2021-03-30 ENCOUNTER — Emergency Department (HOSPITAL_BASED_OUTPATIENT_CLINIC_OR_DEPARTMENT_OTHER)
Admission: EM | Admit: 2021-03-30 | Discharge: 2021-03-31 | Disposition: A | Discharge status: Home / Self Care | Payer: Medicare Other | Attending: Emergency Medicine | Admitting: Emergency Medicine

## 2021-03-30 DIAGNOSIS — Z85828 Personal history of other malignant neoplasm of skin: Secondary | ICD-10-CM | POA: Insufficient documentation

## 2021-03-30 DIAGNOSIS — L03116 Cellulitis of left lower limb: Secondary | ICD-10-CM | POA: Insufficient documentation

## 2021-03-30 DIAGNOSIS — I10 Essential (primary) hypertension: Secondary | ICD-10-CM | POA: Diagnosis not present

## 2021-03-30 DIAGNOSIS — Z79899 Other long term (current) drug therapy: Secondary | ICD-10-CM | POA: Diagnosis not present

## 2021-03-30 DIAGNOSIS — Z87891 Personal history of nicotine dependence: Secondary | ICD-10-CM | POA: Diagnosis not present

## 2021-03-30 DIAGNOSIS — M7989 Other specified soft tissue disorders: Secondary | ICD-10-CM | POA: Diagnosis present

## 2021-03-30 DIAGNOSIS — Z96642 Presence of left artificial hip joint: Secondary | ICD-10-CM | POA: Diagnosis not present

## 2021-03-30 LAB — CBC WITH DIFFERENTIAL/PLATELET
Abs Immature Granulocytes: 0.05 10*3/uL (ref 0.00–0.07)
Basophils Absolute: 0 10*3/uL (ref 0.0–0.1)
Basophils Relative: 0 %
Eosinophils Absolute: 0 10*3/uL (ref 0.0–0.5)
Eosinophils Relative: 0 %
HCT: 33.9 % — ABNORMAL LOW (ref 36.0–46.0)
Hemoglobin: 11.4 g/dL — ABNORMAL LOW (ref 12.0–15.0)
Immature Granulocytes: 1 %
Lymphocytes Relative: 16 %
Lymphs Abs: 0.9 10*3/uL (ref 0.7–4.0)
MCH: 35.1 pg — ABNORMAL HIGH (ref 26.0–34.0)
MCHC: 33.6 g/dL (ref 30.0–36.0)
MCV: 104.3 fL — ABNORMAL HIGH (ref 80.0–100.0)
Monocytes Absolute: 0.7 10*3/uL (ref 0.1–1.0)
Monocytes Relative: 12 %
Neutro Abs: 3.8 10*3/uL (ref 1.7–7.7)
Neutrophils Relative %: 71 %
Platelets: 258 10*3/uL (ref 150–400)
RBC: 3.25 MIL/uL — ABNORMAL LOW (ref 3.87–5.11)
RDW: 14.7 % (ref 11.5–15.5)
WBC: 5.3 10*3/uL (ref 4.0–10.5)
nRBC: 0 % (ref 0.0–0.2)

## 2021-03-30 LAB — BASIC METABOLIC PANEL
Anion gap: 8 (ref 5–15)
BUN: 18 mg/dL (ref 8–23)
CO2: 27 mmol/L (ref 22–32)
Calcium: 9.2 mg/dL (ref 8.9–10.3)
Chloride: 95 mmol/L — ABNORMAL LOW (ref 98–111)
Creatinine, Ser: 0.97 mg/dL (ref 0.44–1.00)
GFR, Estimated: 59 mL/min — ABNORMAL LOW (ref >60)
Glucose, Bld: 96 mg/dL (ref 70–99)
Potassium: 3.8 mmol/L (ref 3.5–5.1)
Sodium: 130 mmol/L — ABNORMAL LOW (ref 135–145)

## 2021-03-30 NOTE — ED Triage Notes (Signed)
Pt POV reports chills, pain in left leg since Friday. Also reports leg is swollen, warm to touch, burning sensation.

## 2021-03-30 NOTE — ED Provider Notes (Signed)
[facility] EMERGENCY DEPARTMENT Provider Note  CSN: [id number] Arrival date & time: 03/30/21 2053    History Chief Complaint  Patient presents with   Leg Swelling    Sherri [name] is a 80 y.o. female with prior history of LE cellulitis reports 2 days of pain in LLE with red rash subjective fever. Some swelling which is improved with elevation.    Past Medical History:  Diagnosis Date   Anxiety    takes Valium daily as needed   Basal cell carcinoma 01/21/1988   left nostril (MOHS), sup-right calf (CX35FU)   Basal cell carcinoma 03/22/1996   right calf (CX35FU)   Basal cell carcinoma 03/31/1995   left wing nose   Bruises easily    d/t meds   Cancer (HCC)    basal cell ca, in situ- uterine    Cataracts, bilateral    removed bilateral   Chronic back pain    Dizziness    r/t to meds   GERD (gastroesophageal reflux disease)    no meds on a regular basis but will take Tums if needed   Headache(784.0)    r/t neck issues   History of bronchitis 6-81yrs ago   History of colon polyps    benign   Hyperlipidemia    takes Atorvastatin on Mondays and Fridays   Hypertension    takes Ramipril daily   Joint pain    Joint swelling    Multiple sclerosis ([place])    Neuromuscular disorder ([place])    Dr. Park Liter- [facility], follows [facility]   Nocturia    PONV (postoperative nausea and vomiting)    trouble urinating after surgery in 2014   Postoperative nausea and vomiting 01/11/2019   Rheumatoid arthritis ([place])    Dr Jani Gravel weekly, RA- hands- knees- feet    Rheumatoid arthritis(714.0)    Dr Estanislado Pandy takes [name]  daily   Right wrist fracture    Skin cancer    Squamous cell carcinoma of skin 11/02/2001   in situ-right knee (cx64fu)   Squamous cell carcinoma of skin 11/12/2011   in situ-left forearm (CX35FU), in situ-left foot (CX35FU)   Squamous cell carcinoma of skin 01/22/2012   in situ-left lower forearm (txpbx)   Squamous cell carcinoma of  skin 11/17/2012   right shin (txpbx)   Squamous cell carcinoma of skin 10/28/2013   in situ-right shoulder (CX35FU)   Squamous cell carcinoma of skin 04/28/2014   well diff-right shin (txpbx)   Squamous cell carcinoma of skin 11/02/2014   in situ-Left shin (txpbx)   Squamous cell carcinoma of skin 12/15/2014   in situ-Left hand (txpbx), bowens-left side chest (txpbx)   Squamous cell carcinoma of skin 05/09/2015   in situ-left hand (txpbx)    Squamous cell carcinoma of skin 05/07/2016   in situ-left inner shin,ant, in situ-left inner shin, post, in situ-left outer forearm, in situ-right knuckle   Squamous cell carcinoma of skin 03/1302018   in situ-left shoulder (txpbx), in situ-right inner shin (txpbx), in situ-top of left foot (txpbx), KA- left forearm (txpbx)   Squamous cell carcinoma of skin 12/02/2016   in situ-left inner shin (txpbx)   Squamous cell carcinoma of skin 03/04/2017   in situ-left outer sup, shin (txpbx), in situ- right 2nd knuckle finger (txpbx), in situ- Left wrist (txpbx)   Squamous cell carcinoma of skin 04/06/2018   in situ-above left knee inner (txpbx)   Squamous cell carcinoma of skin 04/21/2018   in situ-right top hand (txpbx)  Squamous cell carcinoma of skin 07/08/2018   in situ-right lower inner shin (txpbx)   Squamous cell carcinoma of skin 04/27/2019   in situ- Left neck(CX35FU), In situ- right neck (CX35FU)   Urinary retention    sees Dr.Wrenn about 2 times a yr    Past Surgical History:  Procedure Laterality Date   [place]     with TAH   BACK SURGERY     several   BREAST BIOPSY Right    Results were negative   BROW LIFT Bilateral 10/02/2016   Procedure: BILATERAL LOWER LID BLEPHAROPLASTY;  Surgeon: Clista Bernhardt, MD;  Location: [facility];  Service: Plastics;  Laterality: Bilateral;   CARPAL TUNNEL RELEASE Right    cataracts     CERVICAL FUSION     Dr Joya Salm   CERVICAL FUSION  12/31/2012   Dr Joya Salm    CHEST TUBE INSERTION     for traumatic Pneumothorax   COLONOSCOPY  07/16/2010   normal    COLONOSCOPY W/ POLYPECTOMY  1997   negative since; Dr Deatra Ina   ESOPHAGOGASTRODUODENOSCOPY  07/16/2010   normal   eye lid raise     EYE SURGERY Bilateral    cataracts removed - /w IOL   JOINT REPLACEMENT     LUMBAR FUSION     Dr Joya Salm   NASAL SINUS SURGERY     POSTERIOR CERVICAL FUSION/FORAMINOTOMY N/A 12/31/2012   Procedure: POSTERIOR LATERAL CERVICAL FUSION/FORAMINOTOMY LEVEL 1 CERVICAL THREE-FOUR WITH LATERAL MASS SCREWS;  Surgeon: Floyce Stakes, MD;  Location: [facility];  Service: Neurosurgery;  Laterality: N/A;   PTOSIS REPAIR Bilateral 10/02/2016   Procedure: INTERNAL PTOSIS REPAIR;  Surgeon: Clista Bernhardt, MD;  Location: [facility];  Service: Plastics;  Laterality: Bilateral;   SKIN BIOPSY     THYROID SURGERY     R lobe removed- 1972, has grown back - CT last done- 2018   TOTAL HIP ARTHROPLASTY  12/22/2011   Procedure: TOTAL HIP ARTHROPLASTY;  Surgeon: Kerin Salen, MD;  Location: [facility];  Service: Orthopedics;  Laterality: Left;   TOTAL SHOULDER ARTHROPLASTY     TUBAL LIGATION     UPPER GASTROINTESTINAL ENDOSCOPY  2012   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    Family History  Problem Relation Age of Onset   Cancer Mother        pancreatic   Diabetes Mother    Pancreatic cancer Mother    Heart disease Father        Rheumatic   Cancer Father        ? stomach   Stomach cancer Father    Cancer Sister        stomach   Stomach cancer Sister    Cancer Maternal Aunt        X 4; ? primary   Heart disease Paternal Aunt    Cancer Maternal Grandmother        cervical   Colon cancer Other        Aunts   Multiple sclerosis Daughter    Hypertension Neg Hx    Stroke Neg Hx    Esophageal cancer Neg Hx    Rectal cancer Neg Hx     Social History   Tobacco Use   Smoking status: Former    Packs/day: 2.50    Years: 20.00    Pack years: 50.00    Types: Cigarettes    Quit  date: 05/26/1981  Years since quitting: 39.8   Smokeless tobacco: Never   Tobacco comments:    Smoked [phone], up to 2.5 ppd  Vaping Use   Vaping Use: Never used  Substance Use Topics   Alcohol use: Yes    Alcohol/week: 14.0 standard drinks    Types: 14 Cans of beer per week    Comment: 2% beer   Drug use: Never     Home Medications Prior to Admission medications   Medication Sig Start Date End Date Taking? Authorizing Provider  doxycycline (VIBRAMYCIN) 100 MG capsule Take 1 capsule (100 mg total) by mouth 2 (two) times daily. 03/31/21  Yes Truddie Hidden, MD  atorvastatin (LIPITOR) 10 MG tablet TAKE 1 TAB ON MONDAYS AND FRIDAY 02/19/21   Binnie Rail, MD  Biotin 5000 MCG TABS Take 5,000 mcg by mouth daily.    [provider]  Calcium Carbonate-Vitamin D (CALCIUM + D PO) Take 1 tablet by mouth daily.     [provider]  Carboxymethylcellul-Glycerin (LUBRICATING EYE DROPS OP) Apply 1 drop to eye daily as needed (dry eyes).    [provider]  carvedilol (COREG) 6.25 MG tablet Take 1 tablet (6.25 mg total) by mouth 2 (two) times daily with a meal. 07/22/20   Burns, Claudina Lick, MD  Cholecalciferol (VITAMIN D3) 2000 units capsule Take 2,000 Units by mouth daily.    [provider]  Cyanocobalamin (VITAMIN B-12 PO) Take 1 tablet by mouth daily.    [provider]  denosumab (PROLIA) 60 MG/ML SOSY injection Inject 60 mg into the skin every 6 (six) months. Deliver to clinic: [address] [place], [place], [place] [address] 12/14/20 12/14/21  Bo Merino, MD  diazepam (VALIUM) 5 MG tablet Take 1 tablet (5 mg total) by mouth at bedtime. 01/23/21   Binnie Rail, MD  diclofenac sodium (VOLTAREN) 1 % GEL APPLY 3 GRAMS TO 3 LARGE JOINTS 3 TIMES A DAY AS NEEDED 01/27/18   Bo Merino, MD  famotidine (PEPCID) 40 MG tablet Take 1 tablet (40 mg total) by mouth 2 (two) times daily. 09/17/20 09/12/21  Ladene Artist, MD  folic acid (FOLVITE) 1 MG  tablet Take 2 tablets (2 mg total) by mouth daily. 12/18/20   Ofilia Neas, PA-C  furosemide (LASIX) 20 MG tablet TAKE 1 TABLET BY MOUTH EVERY DAY 03/27/21   Binnie Rail, MD  methenamine (HIPREX) 1 g tablet Take 1 g by mouth 2 (two) times daily. 01/29/21   [provider]  methotrexate 50 MG/2ML injection INJECT 0.6 ML INTO THE SKIN ONCE WEEKLY. 02/12/21   Ofilia Neas, PA-C  predniSONE (DELTASONE) 5 MG tablet TAKE 1 TABLET BY MOUTH EVERY DAY WITH BREAKFAST 03/13/21   Ofilia Neas, PA-C  psyllium (METAMUCIL) 58.6 % powder Take 1 packet by mouth as needed.    [provider]  Pyridoxine HCl (VITAMIN B-6 PO) Take 1 tablet by mouth daily.    [provider]  traMADol (ULTRAM) 50 MG tablet Take 1 tablet (50 mg total) by mouth 2 (two) times daily. Must keep follow up 11/20/20 for future refills 01/23/21   Binnie Rail, MD  TUBERCULIN SYR 1CC/27GX1/2" 27G X 1/2" 1 ML MISC Use 1 syringe once weekly to inject methotrexate. 09/04/20   Ofilia Neas, PA-C  Upadacitinib ER 15 MG TB24 TAKE 1 TABLET (15 MG) BY MOUTH DAILY. 08/22/20 08/22/21  Ofilia Neas, PA-C     Allergies    Darvon [propoxyphene hcl],  Meperidine hcl, Penicillins, Azathioprine, Sulfa antibiotics, Atorvastatin, Demerol [meperidine], Leflunomide, and Ezetimibe-simvastatin   Review of Systems   Review of Systems A comprehensive review of systems was completed and negative except as noted in HPI.    Physical Exam BP 135/62   Pulse 77   Temp 99.1 F (37.3 C)   Resp 18   Ht 5\' 3"  (1.6 m)   Wt 52.2 kg   SpO2 95%   BMI 20.37 kg/m   Physical Exam Vitals and nursing note reviewed.  Constitutional:      Appearance: Normal appearance.  HENT:     Head: Normocephalic and atraumatic.     Nose: Nose normal.     Mouth/Throat:     Mouth: Mucous membranes are moist.  Eyes:     Extraocular Movements: Extraocular movements intact.     Conjunctiva/sclera: Conjunctivae normal.  Cardiovascular:     Rate and  Rhythm: Normal rate.  Pulmonary:     Effort: Pulmonary effort is normal.     Breath sounds: Normal breath sounds.  Abdominal:     General: Abdomen is flat.     Palpations: Abdomen is soft.     Tenderness: There is no abdominal tenderness.  Musculoskeletal:        General: No swelling. Normal range of motion.     Cervical back: Neck supple.     Comments: Macular erythema to LLE with confluence, warmth and soft tissue tenderness. Minimal localized swelling, no tenderness over the deep veins. No drainage or fluctuance.   Skin:    General: Skin is warm and dry.  Neurological:     General: No focal deficit present.     Mental Status: She is alert.  Psychiatric:        Mood and Affect: Mood normal.       ED Results / Procedures / Treatments   Labs (all labs ordered are listed, but only abnormal results are displayed) Labs Reviewed  BASIC METABOLIC PANEL - Abnormal; Notable for the following components:      Result Value   Sodium 130 (*)    Chloride 95 (*)    GFR, Estimated 59 (*)    All other components within normal limits  CBC WITH DIFFERENTIAL/PLATELET - Abnormal; Notable for the following components:   RBC 3.25 (*)    Hemoglobin 11.4 (*)    HCT 33.9 (*)    MCV 104.3 (*)    MCH 35.1 (*)    All other components within normal limits    EKG None   Radiology No results found.  Procedures Procedures  Medications Ordered in the ED Medications  doxycycline (VIBRA-TABS) tablet 100 mg (has no administration in time range)     MDM Rules/Calculators/A&P MDM Patient with LE rash, swelling and tenderness, similar to previous cellulitis. Unlikely to be DVT. Will check labs and reassess. Not febrile in the ED.   ED Course  I have reviewed the triage vital signs and the nursing notes.  Pertinent labs & imaging results that were available during my care of the patient were reviewed by me and considered in my medical decision making (see chart for details).  Clinical  Course as of 03/31/21 0038  Sat Mar 30, 2021  2346 CBC without leukocytosis.  [CS]  Sun Mar 31, 2021  0015 BMP with mild hyponatremia otherwise neg.  [CS]  0036 Patient is eager to go home. Will d/c with Rx for doxycycline. Recommend 48 hour recheck or RTED if redness spreads, fever is worse  or for any other concerns.  [CS]    Clinical Course User Index [CS] Truddie Hidden, MD    Final Clinical Impression(s) / ED Diagnoses Final diagnoses:  Cellulitis of left leg    Rx / DC Orders ED Discharge Orders          Ordered    doxycycline (VIBRAMYCIN) 100 MG capsule  2 times daily        03/31/21 0038             Truddie Hidden, MD 03/31/21 [phone]

## 2021-03-31 MED ORDER — DOXYCYCLINE HYCLATE 100 MG PO TABS
100.0000 mg | ORAL_TABLET | Freq: Once | ORAL | Status: AC
  Administered 2021-03-31: 100 mg via ORAL
  Filled 2021-03-31: qty 1

## 2021-03-31 MED ORDER — DOXYCYCLINE HYCLATE 100 MG PO CAPS: 100.0000 mg | ORAL_CAPSULE | Freq: Two times a day (BID) | ORAL | 0 refills | Status: DC

## 2021-04-01 ENCOUNTER — Telehealth: Payer: Self-pay

## 2021-04-01 NOTE — Telephone Encounter (Signed)
Has the patient been evaluated by her PCP?   Please also clarify if she has stopped rinvoq and MTX?

## 2021-04-01 NOTE — Telephone Encounter (Signed)
Patient states she is not  wanting to be seen by [place] tomorrow to discuss other treatment options at this time. Patient advised if she continues to have recurrent infections she will likely be unable to continue on the current treatment regimen.   Patient advised she should continue to hold rinvoq and MTX until the infection has completely resolved and she has completed the course of doxycycline.   Patient states she will complete her doxycycline and wait for it to be completely resolved and then be seen.

## 2021-04-01 NOTE — Telephone Encounter (Signed)
Please clarify if she is wanting to be seen by [place] tomorrow to discuss other treatment options? If she continues to have recurrent infections she will likely be unable to continue on the current treatment regimen.   She should continue to hold rinvoq and MTX until the infection has completely resolved and she has completed the course of doxycycline.

## 2021-04-01 NOTE — Telephone Encounter (Signed)
Attempted to contact the patient and left message for patient to call the office.  

## 2021-04-01 NOTE — Telephone Encounter (Signed)
Patient called stating she had to go to the ER on Saturday, 03/30/21 for a rash on her left leg.  Patient scheduled appointment with Lovena Le tomorrow 04/02/21 at 10:40.

## 2021-04-01 NOTE — Telephone Encounter (Signed)
Has the patient been evaluated by her PCP? Patient states she has not seen her PCP.    Please also clarify if she has stopped rinvoq and MTX? Patient states she has stopped taking the MTX and [name]  Patient states she has had Cellulitis 3 times in less than a year.

## 2021-04-02 ENCOUNTER — Ambulatory Visit: Payer: Medicare Other | Admitting: Physician Assistant

## 2021-04-08 ENCOUNTER — Other Ambulatory Visit: Payer: Self-pay | Admitting: Internal Medicine

## 2021-04-08 NOTE — Progress Notes (Signed)
Office Visit Note  Patient: Sherri [name]             Date of Birth: Sep 09, 1940           MRN: [id number]             PCP: Binnie Rail, MD Referring: Binnie Rail, MD Visit Date: 04/22/2021 Occupation: @GUAROCC @  Subjective:  Right sided thoracic pain    History of Present Illness: Sherri [name] is a 80 y.o. female with history of seropositive rheumatoid arthritis and osteoporosis.  She is taking [name] 15 mg 1 tablet by mouth daily, Methotrexate 0.6 ml sq injections once weekly, folic acid 1 mg p.o, prednisone 5 mg 1 tablet by mouth daily.  She continues to tolerate these medications.  She was recently evaluated in the ED on 03/30/2021 and was diagnosed with cellulitis of the left lower extremity.  She was prescribed doxycycline which she has completed.  Her cellulitis has completely resolved.  During that time she held [name] and methotrexate for about 3 weeks.  She reports that this is the third episode of cellulitis since starting on [name] initially.  She denies any other recent infections.  She denies any signs or symptoms of a rheumatoid arthritis flare recently.  She is not experiencing any joint swelling at this time. She reports that over the past 2-week she has been experiencing right-sided breast pain.  She describes the pain as a constant aching sensation.  The pain is exacerbated by forward flexing and reaching with her right arm.  She states that the pain feels muscular in origin but she is concerned by the thoracic spinal pain she is experiencing.  She states that her last mammogram was in January 2022.  She states in the past she had a biopsy of the right breast several years ago.  She denies any shortness of breath, pleuritic chest pain, palpitations, or angina.  She has not noticed any bruising or rash.  Her symptoms are alleviated by using heat as well as taking tramadol for pain relief. Her last Prolia injection was on 01/01/2021.  She is due to update DEXA in August  2023.   Activities of Daily Living:  Patient reports morning stiffness for several hours.   Patient Reports nocturnal pain.  Difficulty dressing/grooming: Denies Difficulty climbing stairs: Reports Difficulty getting out of chair: Denies Difficulty using hands for taps, buttons, cutlery, and/or writing: Reports  Review of Systems  Constitutional:  Negative for fatigue.  HENT:  Positive for mouth dryness and nose dryness. Negative for mouth sores.   Eyes:  Positive for dryness. Negative for pain and itching.  Respiratory:  Positive for shortness of breath. Negative for difficulty breathing.   Cardiovascular:  Negative for chest pain and palpitations.  Gastrointestinal:  Negative for blood in stool, constipation and diarrhea.  Endocrine: Negative for increased urination.  Genitourinary:  Positive for difficulty urinating.  Musculoskeletal:  Positive for joint pain, joint pain, joint swelling, myalgias, morning stiffness, muscle tenderness and myalgias.  Skin:  Negative for color change and rash.  Allergic/Immunologic: Positive for susceptible to infections.  Neurological:  Negative for dizziness, numbness, headaches, memory loss and weakness.  Hematological:  Positive for bruising/bleeding tendency.  Psychiatric/Behavioral:  Negative for confusion.    PMFS History:  Patient Active Problem List   Diagnosis Date Noted   Senile purpura ([place]) 12/19/2020   Restless leg 02/02/2020   Bilateral leg edema 01/11/2020   Cervical radiculopathy 06/28/2019   Carpal tunnel syndrome on  both sides 06/28/2019   Right sided sciatica 04/14/2018   Trochanteric bursitis of right hip 04/14/2018   S/P cervical spinal fusion 11/10/2017   Dysphagia 10/26/2017   Gastroesophageal reflux disease 08/06/2017   Easy bruising 08/06/2017   Mid back pain 03/26/2017   Submandibular gland swelling 01/08/2017   High risk medication use 09/02/2016   Piriformis muscle pain 09/02/2016   Right hip pain 04/08/2016    Right knee pain 02/25/2016   Numbness 11/14/2014   Biceps tendon tear 10/10/2014   Insomnia 10/10/2014   Gait disturbance 06/13/2014   OP (osteoporosis) 01/18/2014   Other long term (current) drug therapy 01/18/2014   Hoarseness 12/29/2013   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 01/01/2013   Personal history of colonic polyps 07/03/2010   Nocturia 05/28/2010   THYROID NODULE 05/17/2008   Multiple sclerosis ([place]) 05/17/2008   Other fatigue 03/30/2007   Hyperlipidemia 08/20/2006   Essential hypertension 08/20/2006   Rheumatoid arthritis ([place]) 08/20/2006   CERVICAL CANCER, HX OF 08/20/2006   GASTRIC ULCER, HX OF 08/20/2006    Past Medical History:  Diagnosis Date   Anxiety    takes Valium daily as needed   Basal cell carcinoma 01/21/1988   left nostril (MOHS), sup-right calf (CX35FU)   Basal cell carcinoma 03/22/1996   right calf (CX35FU)   Basal cell carcinoma 03/31/1995   left wing nose   Bruises easily    d/t meds   Cancer ([facility])    basal cell ca, in situ- uterine    Cataracts, bilateral    removed bilateral   Chronic back pain    Dizziness    r/t to meds   GERD (gastroesophageal reflux disease)    no meds on a regular basis but will take Tums if needed   Headache(784.0)    r/t neck issues   History of bronchitis 6-19yrs ago   History of colon polyps    benign   Hyperlipidemia    takes Atorvastatin on Mondays and Fridays   Hypertension    takes Ramipril daily   Joint pain    Joint swelling    Multiple sclerosis ([place])    Neuromuscular disorder ([place])    Dr. Park Liter- [facility], follows [facility]   Nocturia    PONV (postoperative nausea and vomiting)    trouble urinating after surgery in 2014   Postoperative nausea and vomiting 01/11/2019   Rheumatoid arthritis ([place])    Dr Jani Gravel weekly, RA- hands- knees- feet    Rheumatoid arthritis(714.0)    Dr Estanislado Pandy takes [name] daily   Right wrist fracture    Skin cancer    Squamous cell  carcinoma of skin 11/02/2001   in situ-right knee (cx23fu)   Squamous cell carcinoma of skin 11/12/2011   in situ-left forearm (CX35FU), in situ-left foot (CX35FU)   Squamous cell carcinoma of skin 01/22/2012   in situ-left lower forearm (txpbx)   Squamous cell carcinoma of skin 11/17/2012   right shin (txpbx)   Squamous cell carcinoma of skin 10/28/2013   in situ-right shoulder (CX35FU)   Squamous cell carcinoma of skin 04/28/2014   well diff-right shin (txpbx)   Squamous cell carcinoma of skin 11/02/2014   in situ-Left shin (txpbx)   Squamous cell carcinoma of skin 12/15/2014   in situ-Left hand (txpbx), bowens-left side chest (txpbx)   Squamous cell carcinoma of skin 05/09/2015   in situ-left hand (txpbx)    Squamous cell carcinoma of skin 05/07/2016   in situ-left inner shin,ant, in situ-left  inner shin, post, in situ-left outer forearm, in situ-right knuckle   Squamous cell carcinoma of skin 03/1302018   in situ-left shoulder (txpbx), in situ-right inner shin (txpbx), in situ-top of left foot (txpbx), KA- left forearm (txpbx)   Squamous cell carcinoma of skin 12/02/2016   in situ-left inner shin (txpbx)   Squamous cell carcinoma of skin 03/04/2017   in situ-left outer sup, shin (txpbx), in situ- right 2nd knuckle finger (txpbx), in situ- Left wrist (txpbx)   Squamous cell carcinoma of skin 04/06/2018   in situ-above left knee inner (txpbx)   Squamous cell carcinoma of skin 04/21/2018   in situ-right top hand (txpbx)   Squamous cell carcinoma of skin 07/08/2018   in situ-right lower inner shin (txpbx)   Squamous cell carcinoma of skin 04/27/2019   in situ- Left neck(CX35FU), In situ- right neck (CX35FU)   Urinary retention    sees Dr.Wrenn about 2 times a yr    Family History  Problem Relation Age of Onset   Cancer Mother        pancreatic   Diabetes Mother    Pancreatic cancer Mother    Heart disease Father        Rheumatic   Cancer Father        ? stomach    Stomach cancer Father    Cancer Sister        stomach   Stomach cancer Sister    Cancer Maternal Aunt        X 4; ? primary   Heart disease Paternal Aunt    Cancer Maternal Grandmother        cervical   Colon cancer Other        Aunts   Multiple sclerosis Daughter    Hypertension Neg Hx    Stroke Neg Hx    Esophageal cancer Neg Hx    Rectal cancer Neg Hx    Past Surgical History:  Procedure Laterality Date   ABDOMINAL HYSTERECTOMY     1985   APPENDECTOMY     with TAH   BACK SURGERY     several   BREAST BIOPSY Right    Results were negative   BROW LIFT Bilateral 10/02/2016   Procedure: BILATERAL LOWER LID BLEPHAROPLASTY;  Surgeon: Clista Bernhardt, MD;  Location: [facility];  Service: Plastics;  Laterality: Bilateral;   CARPAL TUNNEL RELEASE Right    cataracts     CERVICAL FUSION     Dr Joya Salm   CERVICAL FUSION  12/31/2012   Dr Joya Salm   CHEST TUBE INSERTION     for traumatic Pneumothorax   COLONOSCOPY  07/16/2010   normal    COLONOSCOPY W/ POLYPECTOMY  1997   negative since; Dr Deatra Ina   ESOPHAGOGASTRODUODENOSCOPY  07/16/2010   normal   eye lid raise     EYE SURGERY Bilateral    cataracts removed - /w IOL   JOINT REPLACEMENT     LUMBAR FUSION     Dr Joya Salm   NASAL SINUS SURGERY     POSTERIOR CERVICAL FUSION/FORAMINOTOMY N/A 12/31/2012   Procedure: POSTERIOR LATERAL CERVICAL FUSION/FORAMINOTOMY LEVEL 1 CERVICAL THREE-FOUR WITH LATERAL MASS SCREWS;  Surgeon: Floyce Stakes, MD;  Location: [facility];  Service: Neurosurgery;  Laterality: N/A;   PTOSIS REPAIR Bilateral 10/02/2016   Procedure: INTERNAL PTOSIS REPAIR;  Surgeon: Clista Bernhardt, MD;  Location: [facility];  Service: Plastics;  Laterality: Bilateral;   SKIN BIOPSY     THYROID SURGERY  R lobe removed- 1972, has grown back - CT last done- 2018   TOTAL HIP ARTHROPLASTY  12/22/2011   Procedure: TOTAL HIP ARTHROPLASTY;  Surgeon: Kerin Salen, MD;  Location: [facility];  Service: Orthopedics;  Laterality: Left;    TOTAL SHOULDER ARTHROPLASTY     TUBAL LIGATION     UPPER GASTROINTESTINAL ENDOSCOPY  2012   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   Social History   Social History Narrative   Not on file   Immunization History  Administered Date(s) Administered   Fluad Quad(high Dose 65+) 02/02/2020   Influenza Whole 02/22/2007, 02/08/2013   Influenza, High Dose Seasonal PF 02/04/2015, 02/18/2017, 02/21/2018, 01/16/2019, 01/25/2021   Influenza-Unspecified 01/24/2014, 01/25/2016, 02/18/2017, 01/16/2019, 01/25/2020   PFIZER(Purple Top)SARS-COV-2 Vaccination 06/18/2019, 07/09/2019, 01/09/2020, 07/03/2020, 02/07/2021   Pfizer Covid-19 Vaccine Bivalent Booster 52yrs & up 02/08/2021   Pneumococcal Conjugate-13 11/23/2012, 09/08/2014   Pneumococcal Polysaccharide-23 05/26/2006   Pneumococcal-Unspecified 12/28/2012, 06/09/2020   Td 05/28/2010   Tdap 12/24/2020, 01/07/2021   Unspecified SARS-COV-2 Vaccination 06/26/2020   Varicella 11/07/2020   Zoster Recombinat (Shingrix) 02/10/2019, 04/14/2019   Zoster, Live 05/26/2009, 07/10/2020     Objective: Vital Signs: BP 131/76 (BP Location: Left Arm, Patient Position: Sitting, Cuff Size: Normal)   Pulse 69   Ht 5\' 3"  (1.6 m)   Wt 121 lb (54.9 kg)   BMI 21.43 kg/m    Physical Exam Vitals and nursing note reviewed.  Constitutional:      Appearance: She is well-developed.  HENT:     Head: Normocephalic and atraumatic.  Eyes:     Conjunctiva/sclera: Conjunctivae normal.  Pulmonary:     Effort: Pulmonary effort is normal.  Abdominal:     Palpations: Abdomen is soft.  Musculoskeletal:     Cervical back: Normal range of motion.  Skin:    General: Skin is warm and dry.     Capillary Refill: Capillary refill takes less than 2 seconds.  Neurological:     Mental Status: She is alert and oriented to person, place, and time.  Psychiatric:        Behavior: Behavior normal.     Musculoskeletal Exam: C-spine has limited ROM with lateral rotation,  especially to the left.  Thoracic kyphosis noted.  Midline spinal tenderness in the thoracic region. No sternoclavicular joint tenderness.  No tenderness at costosternal articulations. Shoulder joints, elbow joints, wrist joints, MCPs, PIPs, and DIPs have good ROM with no discomfort or synovitis. Thickening of PIP and DIP joints.  Complete fist formation bilaterally.  Left hip replacement has good ROM.  Right hip has slightly limited ROM with lateral rotation.  Knee joints have good ROM with no warmth or effusion.  Bilateral knee crepitus noted.  Ankle joints have good ROM with no tenderness or joint swelling.   CDAI Exam: CDAI Score: -- Patient Global: --; Provider Global: -- Swollen: --; Tender: -- Joint Exam 04/22/2021   No joint exam has been documented for this visit   There is currently no information documented on the homunculus. Go to the Rheumatology activity and complete the homunculus joint exam.  Investigation: No additional findings.  Imaging: No results found.  Recent Labs: Lab Results  Component Value Date   WBC 5.3 03/30/2021   HGB 11.4 (L) 03/30/2021   PLT 258 03/30/2021   NA 130 (L) 03/30/2021   K 3.8 03/30/2021   CL 95 (L) 03/30/2021   CO2 27 03/30/2021   GLUCOSE 96 03/30/2021   BUN 18 03/30/2021  CREATININE 0.97 03/30/2021   BILITOT 0.4 02/27/2021   ALKPHOS 29 (L) 04/10/2020   AST 27 02/27/2021   ALT 24 02/27/2021   PROT 6.7 02/27/2021   ALBUMIN 4.2 04/10/2020   CALCIUM 9.2 03/30/2021   GFRAA 65 09/26/2020   QFTBGOLDPLUS NEGATIVE 09/26/2020    Speciality Comments: Prior therapy includes: [name] (cost), Olumiant-inadequate response, Imuran weakness, leflunomide-intolerance Prolia: 05/04/18, 11/12/18, 06/09/19, 12/07/19, 07/05/20  Procedures:  No procedures performed Allergies: Darvon [propoxyphene hcl], Meperidine hcl, Penicillins, Azathioprine, Sulfa antibiotics, Atorvastatin, Demerol [meperidine], Leflunomide, and Ezetimibe-simvastatin      Assessment / Plan:     Visit Diagnoses: Rheumatoid arthritis involving multiple sites with positive rheumatoid factor ([place]) - She has no synovitis on examination today.  She has not had any signs or symptoms of a rheumatoid arthritis flare.  Overall her rheumatoid arthritis has been well controlled on the current treatment regimen of [name] 15 mg 1 tablet daily, methotrexate 0.6 mL sq injections once weekly, and prednisone 5 mg daily.  Her rheumatoid arthritis has been best controlled on the current treatment regimen but she continues to have recurrent infections.  The most recent infection was cellulitis of the left lower extremity which she was treated with doxycycline.  During that time she held methotrexate and [name] as advised.  This is her third episode of cellulitis since starting on [name]. Different treatment options were discussed today in detail.  She is not a good candidate for TNF inhibitors due to history of multiple sclerosis.  In the past she took [place] but discontinued due to cost.  She had an inadequate response to Olumiant.  She discontinued Imuran and leflunomide due to side effects. She does not want to make any medication changes at this time since the combination has been so effective at managing her RA.  Discussed that if she continues to have recurrent infections we could possibly switch her back to [place] alternating 5 mg and 10 mg every other day to try to reduce her susceptibility to infections. Discussed the importance of close surveillance.  Also discussed the importance of stopping methotrexate and [name] right away at the first signs of an infection and to only resume once the infection is completely cleared and she has been cleared by provider to resume therapy.  She voiced understanding.  She will continue to follow-up with [place] very closely every 2 to 3 months.  High risk medication use - [name] 15 mg 1 tablet by mouth daily, Methotrexate 0.6 ml sq injections once  weekly, folic acid 1 mg p.o, prednisone 5 mg 1 tablet by mouth daily. TB gold negative on 09/26/20.  BMP and CBC updated on 03/30/21.  She will return for lab work in January and every 3 months to monitor for drug toxicity.  Standing orders for CBC and CMP remain in place. She is aware of the black box warning associated with Jak inhibitors including increased risk for MACE and malignancy.  Her blood pressure was well controlled today in the office.  She remains on Lipitor for management of hyperlipidemia.  Future order for lipid panel is in place and should be drawn with upcoming lab work in January while fasting.  Association of heart disease with rheumatoid arthritis was discussed. Need to monitor blood pressure, cholesterol, and to exercise 30-60 minutes on daily basis was discussed.  Discussed the importance of holding [name] and methotrexate if she develops signs or symptoms of an infection and to resume once the infection is completely cleared.  She voiced understanding.  Prior therapy includes: [name] (cost), Olumiant-inadequate response, Imuran weakness, leflunomide-intolerance.   History of cellulitis - x3. She was evaluated in the ED on 03/30/21 and was diagnosed with cellulitis of the left LE.  She was prescribed doxycyline which resolved the cellulitis.  During that time she had methotrexate and [name] for about 3 weeks as recommended. Discussed the risks of immunosuppression while on combination therapy with [name] and methotrexate.  Discussed that unfortunately there is no reduced dose of her invoke that we could try in order to reduce her risk for immunosuppression.  Discussed that if she continues to have recurrent infections we could try switching her back to [place] and possibly alternating 5 mg and 10 mg tablets every other day.  For now she would like to remain on rinvoq with close surveillance.  Discussed signs and symptoms to monitor for.  Discussed the importance of holding [name] and  methotrexate at the first signs of any infection and to resume only once the infection is completely cleared and she has been cleared by a provider to resume therapy.   Age-related osteoporosis without current pathological fracture - DEXA: 01/16/2020 T-score: -4.2. Hx of stress fractures. Most recent fracture: 07/31/20 nondisplaced proximal fifth metatarsal fracture, incomplete.  She has not had any recent falls.  She remains on Prolia 60 mg sq injections every 6 months. Previous Prolia doses: 05/04/18, 11/12/18, 06/09/19, 12/07/19, 07/05/20, most recent was on 01/01/2021.  She is taking a calcium and vitamin D supplement on a daily basis as recommended. Due to update DEXA in August 2023.  Vitamin D deficiency: Vitamin D was 58 on 12/12/20.  She is taking a calcium and vitamin D supplement daily.   Primary osteoarthritis of right hip: She has intermittent discomfort in the right hip.  On examination she has slightly limited ROM with some discomfort in the groin.  She experiences stiffness when rising from a seated position especially after sitting for prolonged periods of time.   History of total hip replacement, left - Dr. Mayer Camel. Doing well.  She has good ROM with no discomfort.   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: Chronic pain.  She has limited ROM with lateral rotation especially to the left.  No symptoms of radiculopathy at this time.   DDD (degenerative disc disease), lumbar: Chronic pain.  No radicular pain at this time.   Closed fracture of right wrist with routine healing, subsequent encounter: Healed.   Rib pain on right side - She presents today with right sided breast pain which started 3 weeks ago with no injury or trauma prior to the onset of symptoms.  She describes the pain as constant and an aching sensation.  Her discomfort is exacerbated by forward flexion and reaching with the right arm.  She has midline spinal tenderness along the thoracic spine and right side of rib cage.  No  tenderness or inflammation over the sternoclavicular joint or costosternal articulations.  No rash or ecchymosis noted.  She is not experiencing any shortness of breath, pleuritic chest pain, or angina.  She has not noticed any external changes to her breast.  According to the patient she had a mammogram in January 2022 and a previous breast biopsy on the right side several years ago.  She has not been evaluated by her PCP yet.   x-rays of the right ribs and thoracic spine were obtained today for further evaluation.  We will call her with the results once they have been interpreted and make further recommendations at  that time.  Plan: DG Ribs Unilateral Right  Pain in thoracic spine -She has thoracic kyphosis.  Midline spinal tenderness in the thoracic region was noted.  She presents today with thoracic spinal pain and right sided breast pain as discussed above.  No injury or trauma. No rash or bruising noted.  She has not had any SOB, pleuritic chest pain, angina, or palpitations.  No recent cough or URI.  X-rays of the thoracic spine were obtained today.  We will call her with the results once they have been interpreted.  Plan: DG Thoracic Spine 4V  Other fatigue: Chronic and secondary to insomnia.   Primary insomnia: She takes valium 5 mg at bedtime.   Other medical conditions are listed as follows:   History of hyperlipidemia: She is taking Lipitor as prescribed.   History of hypertension: BP was 131/76 today in the office.   History of multiple sclerosis ([place]) - She is not a good candidate for TNF inhibitors.   History of anxiety  History of depression  Orders: Orders Placed This Encounter  Procedures   DG Thoracic Spine 4V   DG Ribs Unilateral Right   No orders of the defined types were placed in this encounter.     Follow-Up Instructions: Return in about 3 months (around 07/23/2021) for Rheumatoid arthritis, Osteoporosis.   Ofilia Neas, PA-C  Note - This record has been  created using Dragon software.  Chart creation errors have been sought, but may not always  have been located. Such creation errors do not reflect on  the standard of medical care.

## 2021-04-22 ENCOUNTER — Other Ambulatory Visit: Payer: Self-pay

## 2021-04-22 ENCOUNTER — Ambulatory Visit (HOSPITAL_COMMUNITY)
Admission: RE | Admit: 2021-04-22 | Discharge: 2021-04-22 | Disposition: A | Discharge status: Home / Self Care | Payer: Medicare Other | Source: Ambulatory Visit | Attending: Rheumatology | Admitting: Rheumatology

## 2021-04-22 ENCOUNTER — Encounter: Payer: Self-pay | Admitting: Physician Assistant

## 2021-04-22 ENCOUNTER — Ambulatory Visit: Payer: Medicare Other | Admitting: Physician Assistant

## 2021-04-22 VITALS — BP 131/76 | HR 69 | Ht 63.0 in | Wt 121.0 lb

## 2021-04-22 DIAGNOSIS — M546 Pain in thoracic spine: Secondary | ICD-10-CM | POA: Insufficient documentation

## 2021-04-22 DIAGNOSIS — M0579 Rheumatoid arthritis with rheumatoid factor of multiple sites without organ or systems involvement: Secondary | ICD-10-CM | POA: Diagnosis not present

## 2021-04-22 DIAGNOSIS — Z96642 Presence of left artificial hip joint: Secondary | ICD-10-CM

## 2021-04-22 DIAGNOSIS — G35 Multiple sclerosis: Secondary | ICD-10-CM

## 2021-04-22 DIAGNOSIS — M1611 Unilateral primary osteoarthritis, right hip: Secondary | ICD-10-CM

## 2021-04-22 DIAGNOSIS — R5383 Other fatigue: Secondary | ICD-10-CM

## 2021-04-22 DIAGNOSIS — Z79899 Other long term (current) drug therapy: Secondary | ICD-10-CM

## 2021-04-22 DIAGNOSIS — M810 Age-related osteoporosis without current pathological fracture: Secondary | ICD-10-CM | POA: Diagnosis not present

## 2021-04-22 DIAGNOSIS — M5136 Other intervertebral disc degeneration, lumbar region: Secondary | ICD-10-CM

## 2021-04-22 DIAGNOSIS — R079 Chest pain, unspecified: Secondary | ICD-10-CM | POA: Diagnosis not present

## 2021-04-22 DIAGNOSIS — M4312 Spondylolisthesis, cervical region: Secondary | ICD-10-CM

## 2021-04-22 DIAGNOSIS — Z8659 Personal history of other mental and behavioral disorders: Secondary | ICD-10-CM

## 2021-04-22 DIAGNOSIS — Z872 Personal history of diseases of the skin and subcutaneous tissue: Secondary | ICD-10-CM | POA: Diagnosis not present

## 2021-04-22 DIAGNOSIS — S62101D Fracture of unspecified carpal bone, right wrist, subsequent encounter for fracture with routine healing: Secondary | ICD-10-CM

## 2021-04-22 DIAGNOSIS — N644 Mastodynia: Secondary | ICD-10-CM | POA: Diagnosis not present

## 2021-04-22 DIAGNOSIS — R0781 Pleurodynia: Secondary | ICD-10-CM | POA: Diagnosis not present

## 2021-04-22 DIAGNOSIS — E559 Vitamin D deficiency, unspecified: Secondary | ICD-10-CM

## 2021-04-22 DIAGNOSIS — M47814 Spondylosis without myelopathy or radiculopathy, thoracic region: Secondary | ICD-10-CM | POA: Diagnosis not present

## 2021-04-22 DIAGNOSIS — Z981 Arthrodesis status: Secondary | ICD-10-CM | POA: Diagnosis not present

## 2021-04-22 DIAGNOSIS — Z8679 Personal history of other diseases of the circulatory system: Secondary | ICD-10-CM

## 2021-04-22 DIAGNOSIS — S2241XA Multiple fractures of ribs, right side, initial encounter for closed fracture: Secondary | ICD-10-CM | POA: Diagnosis not present

## 2021-04-22 DIAGNOSIS — F5101 Primary insomnia: Secondary | ICD-10-CM

## 2021-04-22 DIAGNOSIS — Z8639 Personal history of other endocrine, nutritional and metabolic disease: Secondary | ICD-10-CM

## 2021-04-22 NOTE — Patient Instructions (Signed)
Standing Labs We placed an order today for your standing lab work.   Please have your standing labs drawn in January and every 3 months   If possible, please have your labs drawn 2 weeks prior to your appointment so that the provider can discuss your results at your appointment.  Please note that you may see your imaging and lab results in MyChart before we have reviewed them. We may be awaiting multiple results to interpret others before contacting you. Please allow our office up to 72 hours to thoroughly review all of the results before contacting the office for clarification of your results.  We have open lab daily: Monday through Thursday from 1:30-4:30 PM and Friday from 1:30-4:00 PM at the office of Dr. Shaili Deveshwar,  Rheumatology.   Please be advised, all patients with office appointments requiring lab work will take precedent over walk-in lab work.  If possible, please come for your lab work on Monday and Friday afternoons, as you may experience shorter wait times. The office is located at [address], [address], [place], [place] [address] No appointment is necessary.   Labs are drawn by [facility]. Please bring your co-pay at the time of your lab draw.  You may receive a bill from [facility] for your lab work.  If you wish to have your labs drawn at another location, please call the office 24 hours in advance to send orders.  If you have any questions regarding directions or hours of operation,  please call [phone].   As a reminder, please drink plenty of water prior to coming for your lab work. Thanks!  

## 2021-04-29 ENCOUNTER — Telehealth: Payer: Self-pay

## 2021-04-29 NOTE — Telephone Encounter (Signed)
Patient advised she may come by the office and pick up samples.   Samples reserved in the cabinet for patient.

## 2021-04-29 NOTE — Telephone Encounter (Signed)
Patient called requesting to pick up samples of [name].  Patient requested a return call.

## 2021-05-07 ENCOUNTER — Telehealth: Payer: Self-pay

## 2021-05-07 ENCOUNTER — Other Ambulatory Visit: Payer: Self-pay | Admitting: Internal Medicine

## 2021-05-07 ENCOUNTER — Other Ambulatory Visit: Payer: Self-pay | Admitting: Physician Assistant

## 2021-05-07 NOTE — Telephone Encounter (Signed)
PA renewal initiated automatically by CoverMyMeds.  Submitted a Prior Authorization request to [facility] for [facility] via CoverMyMeds. Will update once we receive a response.   Key: [id number]

## 2021-05-07 NOTE — Telephone Encounter (Signed)
Next Visit: 07/23/2021  Last Visit: 04/22/2021  Last Fill: 03/13/2021  Dx: Rheumatoid arthritis involving multiple sites with positive rheumatoid factor  Current Dose per office note on 04/22/2021: prednisone 5 mg 1 tablet by mouth daily  Okay to refill Prednisone?

## 2021-05-07 NOTE — Telephone Encounter (Signed)
Received notification from [facility] regarding a prior authorization for [facility]. Authorization has been APPROVED from 05/07/2021 to 05/25/2022. Approval letter sent to scan center.  Authorization # [id number]

## 2021-05-11 ENCOUNTER — Encounter: Payer: Self-pay | Admitting: Internal Medicine

## 2021-05-23 ENCOUNTER — Telehealth (INDEPENDENT_AMBULATORY_CARE_PROVIDER_SITE_OTHER): Payer: Medicare Other | Admitting: Family Medicine

## 2021-05-23 ENCOUNTER — Encounter: Payer: Self-pay | Admitting: Family Medicine

## 2021-05-23 DIAGNOSIS — R059 Cough, unspecified: Secondary | ICD-10-CM

## 2021-05-23 MED ORDER — DOXYCYCLINE HYCLATE 100 MG PO TABS: 100.0000 mg | ORAL_TABLET | Freq: Two times a day (BID) | ORAL | 0 refills | Status: DC

## 2021-05-23 MED ORDER — BENZONATATE 100 MG PO CAPS: ORAL_CAPSULE | ORAL | 0 refills | Status: DC

## 2021-05-23 NOTE — Patient Instructions (Signed)
-  I sent the medication(s) we discussed to your pharmacy: Meds ordered this encounter  Medications   doxycycline (VIBRA-TABS) 100 MG tablet    Sig: Take 1 tablet (100 mg total) by mouth 2 (two) times daily.    Dispense:  14 tablet    Refill:  0   benzonatate (TESSALON PERLES) 100 MG capsule    Sig: 1-2 capsules up to twice daily for cough    Dispense:  30 capsule    Refill:  0     I hope you are feeling better soon!  Seek in person care promptly if your symptoms worsen, new concerns arise or you are not improving with treatment.  It was nice to meet you today. I help [place] out with telemedicine visits on Tuesdays and Thursdays and am happy to help if you need a virtual follow up visit on those days. Otherwise, if you have any concerns or questions following this visit please schedule a follow up visit with your Primary Care office or seek care at a local urgent care clinic to avoid delays in care

## 2021-05-23 NOTE — Progress Notes (Signed)
Virtual Visit via Video Note  I connected with Sherri [name]  on 05/23/21 at 11:00 AM EST by a video enabled telemedicine application and verified that I am speaking with the correct person using two identifiers.  Location patient: Sherri [name] Location provider:work or home office Persons participating in the virtual visit: patient, provider  I discussed the limitations and requested verbal permission for telemedicine visit. The patient expressed understanding and agreed to proceed.   HPI:  Acute telemedicine visit for cough and congestion: -Onset: about 2 weeks ago -Symptoms include: started out like a flu (husband had covid and she was caring for him), but has persistent cough, congestion, pain in ribs when coughs on R, occ mild SOB, feels tired -Denies: fevers, hemoptysis, vomiting, inability to ear/drink -Pertinent past medical history: see below -Pertinent medication allergies: Allergies  Allergen Reactions   Darvon [Propoxyphene Hcl] Shortness Of Breath    ? Dose Related ? Lowered respirations greatly   Meperidine Hcl Shortness Of Breath   Penicillins Hives and Other (See Comments)    Has patient had a PCN reaction causing immediate rash, facial/tongue/throat swelling, SOB or lightheadedness with hypotension: No SEVERE RASH INVOLVING MUCUS MEMBRANES or SKIN NECROSIS: #  #  #  YES  #  #  #  Has patient had a PCN reaction that required hospitalization No Has patient had a PCN reaction occurring within the last 10 years: No.    Azathioprine Other (See Comments)    Weakness   Sulfa Antibiotics Other (See Comments)    States had severe indigestion and heartburn and nausea and had to stop taking   Atorvastatin Nausea And Vomiting   Demerol [Meperidine]     Other reaction(s): Respiratory Distress   Leflunomide Other (See Comments)    Excessive weight gain   Ezetimibe-Simvastatin Nausea And Vomiting   -COVID-19 vaccine status:  Immunization History  Administered Date(s) Administered    Fluad Quad(high Dose 65+) 02/02/2020   Influenza Whole 02/22/2007, 02/08/2013   Influenza, High Dose Seasonal PF 02/04/2015, 02/18/2017, 02/21/2018, 01/16/2019, 01/25/2021   Influenza-Unspecified 01/24/2014, 01/25/2016, 02/18/2017, 01/16/2019, 01/25/2020   PFIZER(Purple Top)SARS-COV-2 Vaccination 06/18/2019, 07/09/2019, 01/09/2020, 07/03/2020, 02/07/2021   Pfizer Covid-19 Vaccine Bivalent Booster 44yrs & up 02/08/2021   Pneumococcal Conjugate-13 11/23/2012, 09/08/2014   Pneumococcal Polysaccharide-23 05/26/2006   Pneumococcal-Unspecified 12/28/2012, 06/09/2020   Td 05/28/2010   Tdap 12/24/2020, 01/07/2021   Unspecified SARS-COV-2 Vaccination 06/26/2020   Varicella 11/07/2020   Zoster Recombinat (Shingrix) 02/10/2019, 04/14/2019   Zoster, Live 05/26/2009, 07/10/2020     ROS: See pertinent positives and negatives per HPI.  Past Medical History:  Diagnosis Date   Anxiety    takes Valium daily as needed   Basal cell carcinoma 01/21/1988   left nostril (MOHS), sup-right calf (CX35FU)   Basal cell carcinoma 03/22/1996   right calf (CX35FU)   Basal cell carcinoma 03/31/1995   left wing nose   Bruises easily    d/t meds   Cancer (HCC)    basal cell ca, in situ- uterine    Cataracts, bilateral    removed bilateral   Chronic back pain    Dizziness    r/t to meds   GERD (gastroesophageal reflux disease)    no meds on a regular basis but will take Tums if needed   Headache(784.0)    r/t neck issues   History of bronchitis 6-36yrs ago   History of colon polyps    benign   Hyperlipidemia    takes Atorvastatin on Mondays and Fridays   Hypertension  takes Ramipril daily   Joint pain    Joint swelling    Multiple sclerosis (HCC)    Neuromuscular disorder (HCC)    Dr. Park Liter- [facility], follows [facility]   Nocturia    PONV (postoperative nausea and vomiting)    trouble urinating after surgery in 2014   Postoperative nausea and vomiting 01/11/2019   Rheumatoid arthritis  ([place])    Dr Jani Gravel weekly, RA- hands- knees- feet    Rheumatoid arthritis(714.0)    Dr Estanislado Pandy takes [name] daily   Right wrist fracture    Skin cancer    Squamous cell carcinoma of skin 11/02/2001   in situ-right knee (cx55fu)   Squamous cell carcinoma of skin 11/12/2011   in situ-left forearm (CX35FU), in situ-left foot (CX35FU)   Squamous cell carcinoma of skin 01/22/2012   in situ-left lower forearm (txpbx)   Squamous cell carcinoma of skin 11/17/2012   right shin (txpbx)   Squamous cell carcinoma of skin 10/28/2013   in situ-right shoulder (CX35FU)   Squamous cell carcinoma of skin 04/28/2014   well diff-right shin (txpbx)   Squamous cell carcinoma of skin 11/02/2014   in situ-Left shin (txpbx)   Squamous cell carcinoma of skin 12/15/2014   in situ-Left hand (txpbx), bowens-left side chest (txpbx)   Squamous cell carcinoma of skin 05/09/2015   in situ-left hand (txpbx)    Squamous cell carcinoma of skin 05/07/2016   in situ-left inner shin,ant, in situ-left inner shin, post, in situ-left outer forearm, in situ-right knuckle   Squamous cell carcinoma of skin 03/1302018   in situ-left shoulder (txpbx), in situ-right inner shin (txpbx), in situ-top of left foot (txpbx), KA- left forearm (txpbx)   Squamous cell carcinoma of skin 12/02/2016   in situ-left inner shin (txpbx)   Squamous cell carcinoma of skin 03/04/2017   in situ-left outer sup, shin (txpbx), in situ- right 2nd knuckle finger (txpbx), in situ- Left wrist (txpbx)   Squamous cell carcinoma of skin 04/06/2018   in situ-above left knee inner (txpbx)   Squamous cell carcinoma of skin 04/21/2018   in situ-right top hand (txpbx)   Squamous cell carcinoma of skin 07/08/2018   in situ-right lower inner shin (txpbx)   Squamous cell carcinoma of skin 04/27/2019   in situ- Left neck(CX35FU), In situ- right neck (CX35FU)   Urinary retention    sees Dr.Wrenn about 2 times a yr    Past Surgical History:   Procedure Laterality Date   [place]     with TAH   BACK SURGERY     several   BREAST BIOPSY Right    Results were negative   BROW LIFT Bilateral 10/02/2016   Procedure: BILATERAL LOWER LID BLEPHAROPLASTY;  Surgeon: Clista Bernhardt, MD;  Location: [place];  Service: Plastics;  Laterality: Bilateral;   CARPAL TUNNEL RELEASE Right    cataracts     CERVICAL FUSION     Dr Joya Salm   CERVICAL FUSION  12/31/2012   Dr Joya Salm   CHEST TUBE INSERTION     for traumatic Pneumothorax   COLONOSCOPY  07/16/2010   normal    COLONOSCOPY W/ POLYPECTOMY  1997   negative since; Dr Deatra Ina   ESOPHAGOGASTRODUODENOSCOPY  07/16/2010   normal   eye lid raise     EYE SURGERY Bilateral    cataracts removed - /w [place]  Dr Joya Salm   NASAL SINUS SURGERY     POSTERIOR CERVICAL FUSION/FORAMINOTOMY N/A 12/31/2012   Procedure: POSTERIOR LATERAL CERVICAL FUSION/FORAMINOTOMY LEVEL 1 CERVICAL THREE-FOUR WITH LATERAL MASS SCREWS;  Surgeon: Floyce Stakes, MD;  Location: [facility];  Service: Neurosurgery;  Laterality: N/A;   PTOSIS REPAIR Bilateral 10/02/2016   Procedure: INTERNAL PTOSIS REPAIR;  Surgeon: Clista Bernhardt, MD;  Location: [place];  Service: Plastics;  Laterality: Bilateral;   SKIN BIOPSY     THYROID SURGERY     R lobe removed- 1972, has grown back - CT last done- 2018   TOTAL HIP ARTHROPLASTY  12/22/2011   Procedure: TOTAL HIP ARTHROPLASTY;  Surgeon: Kerin Salen, MD;  Location: [place];  Service: Orthopedics;  Laterality: Left;   TOTAL SHOULDER ARTHROPLASTY     TUBAL LIGATION     UPPER GASTROINTESTINAL ENDOSCOPY  2012   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     Current Outpatient Medications:    atorvastatin (LIPITOR) 10 MG tablet, TAKE 1 TAB ON MONDAYS AND FRIDAY, Disp: 27 tablet, Rfl: 3   benzonatate (TESSALON PERLES) 100 MG capsule, 1-2 capsules up to twice daily for cough, Disp: 30 capsule, Rfl: 0   Biotin 5000  MCG TABS, Take 5,000 mcg by mouth daily., Disp: , Rfl:    Calcium Carbonate-Vitamin D (CALCIUM + D PO), Take 1 tablet by mouth daily. , Disp: , Rfl:    Carboxymethylcellul-Glycerin (LUBRICATING EYE DROPS OP), Apply 1 drop to eye daily as needed (dry eyes)., Disp: , Rfl:    carvedilol (COREG) 6.25 MG tablet, TAKE 1 TABLET BY MOUTH 2 TIMES DAILY WITH A MEAL., Disp: 180 tablet, Rfl: 1   Cholecalciferol (VITAMIN D3) 2000 units capsule, Take 2,000 Units by mouth daily., Disp: , Rfl:    Cyanocobalamin (VITAMIN B-12 PO), Take 1 tablet by mouth daily., Disp: , Rfl:    denosumab (PROLIA) 60 MG/ML SOSY injection, Inject 60 mg into the skin every 6 (six) months. Deliver to clinic: [address], [place], [place] [address], Disp: 1 mL, Rfl: 0   diazepam (VALIUM) 5 MG tablet, Take 1 tablet (5 mg total) by mouth at bedtime., Disp: 30 tablet, Rfl: 5   diclofenac sodium (VOLTAREN) 1 % GEL, APPLY 3 GRAMS TO 3 LARGE JOINTS 3 TIMES A DAY AS NEEDED, Disp: 100 g, Rfl: 0   doxycycline (VIBRA-TABS) 100 MG tablet, Take 1 tablet (100 mg total) by mouth 2 (two) times daily., Disp: 14 tablet, Rfl: 0   famotidine (PEPCID) 40 MG tablet, Take 1 tablet (40 mg total) by mouth 2 (two) times daily., Disp: 60 tablet, Rfl: 11   folic acid (FOLVITE) 1 MG tablet, Take 2 tablets (2 mg total) by mouth daily., Disp: 180 tablet, Rfl: 3   furosemide (LASIX) 20 MG tablet, TAKE 1 TABLET BY MOUTH EVERY DAY, Disp: 90 tablet, Rfl: 1   methenamine (HIPREX) 1 g tablet, Take 1 g by mouth 2 (two) times daily., Disp: , Rfl:    methotrexate 50 MG/2ML injection, INJECT 0.6 ML INTO THE SKIN ONCE WEEKLY., Disp: 8 mL, Rfl: 0   predniSONE (DELTASONE) 5 MG tablet, TAKE 1 TABLET BY MOUTH EVERY DAY WITH BREAKFAST, Disp: 90 tablet, Rfl: 0   psyllium (METAMUCIL) 58.6 % powder, Take 1 packet by mouth as needed., Disp: , Rfl:    traMADol (ULTRAM) 50 MG tablet, Take 1 tablet (50 mg total) by mouth 2 (two) times daily., Disp: 60 tablet, Rfl: 2   TUBERCULIN  SYR 1CC/27GX1/2" 27G X 1/2" 1 ML MISC, Use 1 syringe once weekly to inject methotrexate., Disp: 12 each, Rfl: 3   Upadacitinib ER 15 MG TB24, TAKE 1 TABLET (15 MG) BY MOUTH DAILY., Disp: 30 tablet, Rfl: 0  EXAM:  VITALS per patient if applicable: O2 99, Y69, T 97.3, BP 112/62  GENERAL: alert, oriented, appears well and in no acute distress  HEENT: atraumatic, conjunttiva clear, no obvious abnormalities on inspection of external nose and ears  NECK: normal movements of the head and neck  LUNGS: on inspection no signs of respiratory distress, breathing rate appears normal, no obvious gross SOB, gasping or wheezing  CV: no obvious cyanosis  MS: moves all visible extremities without noticeable abnormality, reports TTP when presses over area of soreness in the ribs  PSYCH/NEURO: pleasant and cooperative, no obvious depression or anxiety, speech and thought processing grossly intact  ASSESSMENT AND PLAN:  Discussed the following assessment and plan:  Cough, unspecified type  -we discussed possible serious and likely etiologies, options for evaluation and workup, limitations of telemedicine visit vs in person visit, treatment, treatment risks and precautions. Pt is agreeable to treatment via telemedicine at this moment. Query sequela of covid infection, possible musculoskeletal strain from coughing vs developing 2ndary bacterial infection vs other. She opted to try doxy 100mg  bid x 7 days and a cough rx. Advised to schedule inperson follow up with PCP next week, particularly if symptoms not resolved.  Advised to seek prompt virtual visit or in person care if worsening, new symptoms arise, or if is not improving with treatment as expected per our conversation of expected course. Discussed options for follow up care. Did let this patient know that I do telemedicine on Tuesdays and Thursdays for [place] and those are the days I am logged into the system. Advised to schedule follow up visit with  PCP, [place] virtual visits or [facility] if any further questions or concerns to avoid delays in care.   I discussed the assessment and treatment plan with the patient. The patient was provided an opportunity to ask questions and all were answered. The patient agreed with the plan and demonstrated an understanding of the instructions.     Sherri Kern, DO

## 2021-05-26 ENCOUNTER — Encounter: Payer: Self-pay | Admitting: Internal Medicine

## 2021-05-28 DIAGNOSIS — H903 Sensorineural hearing loss, bilateral: Secondary | ICD-10-CM | POA: Diagnosis not present

## 2021-05-28 NOTE — Progress Notes (Signed)
Subjective:    Patient ID: Sherri [name], female    DOB: 07-17-1940, 81 y.o.   MRN: [id number]  This visit occurred during the SARS-CoV-2 public health emergency.  Safety protocols were in place, including screening questions prior to the visit, additional usage of staff PPE, and extensive cleaning of exam room while observing appropriate contact time as indicated for disinfecting solutions.    HPI The patient is here for an acute visit.   Right-sided rib cage pain since November -   On 11/18 her husband got covid and she was taking care of him.  She did not test positive but was coughing a lot.  Ever since then she has had pain in her right ribs - mid anterior pain.  Sometimes she has posterior rib pain.  Moving her right arm causes pain.  Taking deep breaths causes pain.    She did have an xray and has a 4 lateral rib nondisplaced fracture.     The cough is just about gone.   She is taking tramadol 2-3 times a day for the pain and it helps. She also takes valium at times at night which helps as well.    Medications and allergies reviewed with patient and updated if appropriate.  Patient Active Problem List   Diagnosis Date Noted   Senile purpura ([place]) 12/19/2020   Restless leg 02/02/2020   Bilateral leg edema 01/11/2020   Cervical radiculopathy 06/28/2019   Carpal tunnel syndrome on both sides 06/28/2019   Right sided sciatica 04/14/2018   Trochanteric bursitis of right hip 04/14/2018   S/P cervical spinal fusion 11/10/2017   Dysphagia 10/26/2017   Gastroesophageal reflux disease 08/06/2017   Easy bruising 08/06/2017   Mid back pain 03/26/2017   Submandibular gland swelling 01/08/2017   High risk medication use 09/02/2016   Piriformis muscle pain 09/02/2016   Right hip pain 04/08/2016   Right knee pain 02/25/2016   Numbness 11/14/2014   Biceps tendon tear 10/10/2014   Insomnia 10/10/2014   Gait disturbance 06/13/2014   OP (osteoporosis) 01/18/2014   Other  long term (current) drug therapy 01/18/2014   Hoarseness 12/29/2013   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 01/01/2013   Personal history of colonic polyps 07/03/2010   Nocturia 05/28/2010   THYROID NODULE 05/17/2008   Multiple sclerosis ([place]) 05/17/2008   Other fatigue 03/30/2007   Hyperlipidemia 08/20/2006   Essential hypertension 08/20/2006   Rheumatoid arthritis ([place]) 08/20/2006   CERVICAL CANCER, HX OF 08/20/2006   GASTRIC ULCER, HX OF 08/20/2006    Current Outpatient Medications on File Prior to Visit  Medication Sig Dispense Refill   atorvastatin (LIPITOR) 10 MG tablet TAKE 1 TAB ON MONDAYS AND FRIDAY 27 tablet 3   Biotin 5000 MCG TABS Take 5,000 mcg by mouth daily.     Calcium Carbonate-Vitamin D (CALCIUM + D PO) Take 1 tablet by mouth daily.      Carboxymethylcellul-Glycerin (LUBRICATING EYE DROPS OP) Apply 1 drop to eye daily as needed (dry eyes).     carvedilol (COREG) 6.25 MG tablet TAKE 1 TABLET BY MOUTH 2 TIMES DAILY WITH A MEAL. 180 tablet 1   Cholecalciferol (VITAMIN D3) 2000 units capsule Take 2,000 Units by mouth daily.     Cyanocobalamin (VITAMIN B-12 PO) Take 1 tablet by mouth daily.     denosumab (PROLIA) 60 MG/ML SOSY injection Inject 60 mg into the skin every 6 (six) months. Deliver to clinic: [address], [place], [place], [place] [address] 1  mL 0   diclofenac sodium (VOLTAREN) 1 % GEL APPLY 3 GRAMS TO 3 LARGE JOINTS 3 TIMES A DAY AS NEEDED 100 g 0   doxycycline (VIBRA-TABS) 100 MG tablet Take 1 tablet (100 mg total) by mouth 2 (two) times daily. 14 tablet 0   famotidine (PEPCID) 40 MG tablet Take 1 tablet (40 mg total) by mouth 2 (two) times daily. 60 tablet 11   folic acid (FOLVITE) 1 MG tablet Take 2 tablets (2 mg total) by mouth daily. 180 tablet 3   furosemide (LASIX) 20 MG tablet TAKE 1 TABLET BY MOUTH EVERY DAY 90 tablet 1   methenamine (HIPREX) 1 g tablet Take 1 g by mouth 2 (two) times daily.     methotrexate 50 MG/2ML injection INJECT 0.6 ML INTO  THE SKIN ONCE WEEKLY. 8 mL 0   predniSONE (DELTASONE) 5 MG tablet TAKE 1 TABLET BY MOUTH EVERY DAY WITH BREAKFAST 90 tablet 0   psyllium (METAMUCIL) 58.6 % powder Take 1 packet by mouth as needed.     TUBERCULIN SYR 1CC/27GX1/2" 27G X 1/2" 1 ML MISC Use 1 syringe once weekly to inject methotrexate. 12 each 3   Upadacitinib ER 15 MG TB24 TAKE 1 TABLET (15 MG) BY MOUTH DAILY. 30 tablet 0   No current facility-administered medications on file prior to visit.    Past Medical History:  Diagnosis Date   Anxiety    takes Valium daily as needed   Basal cell carcinoma 01/21/1988   left nostril (MOHS), sup-right calf (CX35FU)   Basal cell carcinoma 03/22/1996   right calf (CX35FU)   Basal cell carcinoma 03/31/1995   left wing nose   Bruises easily    d/t meds   Cancer ([facility])    basal cell ca, in situ- uterine    Cataracts, bilateral    removed bilateral   Chronic back pain    Dizziness    r/t to meds   GERD (gastroesophageal reflux disease)    no meds on a regular basis but will take Tums if needed   Headache(784.0)    r/t neck issues   History of bronchitis 6-30yrs ago   History of colon polyps    benign   Hyperlipidemia    takes Atorvastatin on Mondays and Fridays   Hypertension    takes Ramipril daily   Joint pain    Joint swelling    Multiple sclerosis ([place])    Neuromuscular disorder ([place])    Dr. Park Liter- [facility], follows M.S.   Nocturia    PONV (postoperative nausea and vomiting)    trouble urinating after surgery in 2014   Postoperative nausea and vomiting 01/11/2019   Rheumatoid arthritis ([place])    Dr Jani Gravel weekly, RA- hands- knees- feet    Rheumatoid arthritis(714.0)    Dr Estanislado Pandy takes [name] daily   Right wrist fracture    Skin cancer    Squamous cell carcinoma of skin 11/02/2001   in situ-right knee (cx72fu)   Squamous cell carcinoma of skin 11/12/2011   in situ-left forearm (CX35FU), in situ-left foot (CX35FU)   Squamous cell  carcinoma of skin 01/22/2012   in situ-left lower forearm (txpbx)   Squamous cell carcinoma of skin 11/17/2012   right shin (txpbx)   Squamous cell carcinoma of skin 10/28/2013   in situ-right shoulder (CX35FU)   Squamous cell carcinoma of skin 04/28/2014   well diff-right shin (txpbx)   Squamous cell carcinoma of skin 11/02/2014   in situ-Left shin (txpbx)  Squamous cell carcinoma of skin 12/15/2014   in situ-Left hand (txpbx), bowens-left side chest (txpbx)   Squamous cell carcinoma of skin 05/09/2015   in situ-left hand (txpbx)    Squamous cell carcinoma of skin 05/07/2016   in situ-left inner shin,ant, in situ-left inner shin, post, in situ-left outer forearm, in situ-right knuckle   Squamous cell carcinoma of skin 03/1302018   in situ-left shoulder (txpbx), in situ-right inner shin (txpbx), in situ-top of left foot (txpbx), KA- left forearm (txpbx)   Squamous cell carcinoma of skin 12/02/2016   in situ-left inner shin (txpbx)   Squamous cell carcinoma of skin 03/04/2017   in situ-left outer sup, shin (txpbx), in situ- right 2nd knuckle finger (txpbx), in situ- Left wrist (txpbx)   Squamous cell carcinoma of skin 04/06/2018   in situ-above left knee inner (txpbx)   Squamous cell carcinoma of skin 04/21/2018   in situ-right top hand (txpbx)   Squamous cell carcinoma of skin 07/08/2018   in situ-right lower inner shin (txpbx)   Squamous cell carcinoma of skin 04/27/2019   in situ- Left neck(CX35FU), In situ- right neck (CX35FU)   Urinary retention    sees Dr.Wrenn about 2 times a yr    Past Surgical History:  Procedure Laterality Date   [place]     with TAH   BACK SURGERY     several   BREAST BIOPSY Right    Results were negative   BROW LIFT Bilateral 10/02/2016   Procedure: BILATERAL LOWER LID BLEPHAROPLASTY;  Surgeon: Clista Bernhardt, MD;  Location: [facility];  Service: Plastics;  Laterality: Bilateral;   CARPAL TUNNEL RELEASE  Right    cataracts     CERVICAL FUSION     Dr Joya Salm   CERVICAL FUSION  12/31/2012   Dr Joya Salm   CHEST TUBE INSERTION     for traumatic Pneumothorax   COLONOSCOPY  07/16/2010   normal    COLONOSCOPY W/ POLYPECTOMY  1997   negative since; Dr Deatra Ina   ESOPHAGOGASTRODUODENOSCOPY  07/16/2010   normal   eye lid raise     EYE SURGERY Bilateral    cataracts removed - /w IOL   JOINT REPLACEMENT     LUMBAR FUSION     Dr Joya Salm   NASAL SINUS SURGERY     POSTERIOR CERVICAL FUSION/FORAMINOTOMY N/A 12/31/2012   Procedure: POSTERIOR LATERAL CERVICAL FUSION/FORAMINOTOMY LEVEL 1 CERVICAL THREE-FOUR WITH LATERAL MASS SCREWS;  Surgeon: Floyce Stakes, MD;  Location: [facility];  Service: Neurosurgery;  Laterality: N/A;   PTOSIS REPAIR Bilateral 10/02/2016   Procedure: INTERNAL PTOSIS REPAIR;  Surgeon: Clista Bernhardt, MD;  Location: [facility];  Service: Plastics;  Laterality: Bilateral;   SKIN BIOPSY     THYROID SURGERY     R lobe removed- 1972, has grown back - CT last done- 2018   TOTAL HIP ARTHROPLASTY  12/22/2011   Procedure: TOTAL HIP ARTHROPLASTY;  Surgeon: Kerin Salen, MD;  Location: [facility];  Service: Orthopedics;  Laterality: Left;   TOTAL SHOULDER ARTHROPLASTY     TUBAL LIGATION     UPPER GASTROINTESTINAL ENDOSCOPY  2012   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    Social History   Socioeconomic History   Marital status: Married    Spouse name: Not on file   Number of children: Not on file   Years of education: Not on file   Highest education level: Not on  file  Occupational History   Occupation: retired  Tobacco Use   Smoking status: Former    Packs/day: 2.50    Years: 20.00    Pack years: 50.00    Types: Cigarettes    Quit date: 05/26/1981    Years since quitting: 40.0   Smokeless tobacco: Never   Tobacco comments:    Smoked 1963-1983, up to 2.5 ppd  Vaping Use   Vaping Use: Never used  Substance and Sexual Activity   Alcohol use: Yes    Alcohol/week: 14.0 standard  drinks    Types: 14 Cans of beer per week    Comment: 2% beer   Drug use: Never   Sexual activity: Yes  Other Topics Concern   Not on file  Social History Narrative   Not on file   Social Determinants of Health   Financial Resource Strain: Not on file  Food Insecurity: No Food Insecurity   Worried About Running Out of Food in the Last Year: Never true   Ran Out of Food in the Last Year: Never true  Transportation Needs: No Transportation Needs   Lack of Transportation (Medical): No   Lack of Transportation (Non-Medical): No  Physical Activity: Inactive   Days of Exercise per Week: 0 days   Minutes of Exercise per Session: 0 min  Stress: No Stress Concern Present   Feeling of Stress : Not at all  Social Connections: Socially Integrated   Frequency of Communication with Friends and Family: More than three times a week   Frequency of Social Gatherings with Friends and Family: Once a week   Attends Religious Services: More than 4 times per year   Active Member of [employer] or Organizations: Yes   Attends Music therapist: More than 4 times per year   Marital Status: Married    Family History  Problem Relation Age of Onset   Cancer Mother        pancreatic   Diabetes Mother    Pancreatic cancer Mother    Heart disease Father        Rheumatic   Cancer Father        ? stomach   Stomach cancer Father    Cancer Sister        stomach   Stomach cancer Sister    Cancer Maternal Aunt        X 4; ? primary   Heart disease Paternal Aunt    Cancer Maternal Grandmother        cervical   Colon cancer Other        Aunts   Multiple sclerosis Daughter    Hypertension Neg Hx    Stroke Neg Hx    Esophageal cancer Neg Hx    Rectal cancer Neg Hx     Review of Systems  Constitutional:  Negative for fever.  Respiratory:  Positive for shortness of breath. Negative for cough and wheezing.   Cardiovascular:  Positive for chest pain (right rib pain).      Objective:    Vitals:   05/29/21 1020  BP: 128/70  Pulse: 70  Temp: 98.2 F (36.8 C)  SpO2: 99%   BP Readings from Last 3 Encounters:  05/29/21 128/70  04/22/21 131/76  03/31/21 135/62   Wt Readings from Last 3 Encounters:  04/22/21 121 lb (54.9 kg)  03/30/21 115 lb (52.2 kg)  02/27/21 119 lb (54 kg)   Body mass index is 21.43 kg/m.   Physical Exam  Constitutional: Appears well-developed and well-nourished. No distress.  Head: Normocephalic and atraumatic.  Cardiovascular: Normal rate, regular rhythm and normal heart sounds.  No murmur heard. No carotid bruit .  No edema Chest:  tenderness with palpation right mid anterior ribs and lateral ribs Pulmonary: Effort normal and breath sounds normal. No respiratory distress. No has no wheezes. No rales.  Skin: Skin is warm and dry. Not diaphoretic.         Assessment & Plan:    See Problem List for Assessment and Plan of chronic medical problems.

## 2021-05-29 ENCOUNTER — Encounter: Payer: Self-pay | Admitting: Internal Medicine

## 2021-05-29 ENCOUNTER — Other Ambulatory Visit: Payer: Self-pay

## 2021-05-29 ENCOUNTER — Ambulatory Visit: Payer: Medicare Other | Admitting: Rheumatology

## 2021-05-29 ENCOUNTER — Ambulatory Visit (INDEPENDENT_AMBULATORY_CARE_PROVIDER_SITE_OTHER): Payer: Medicare Other | Admitting: Internal Medicine

## 2021-05-29 ENCOUNTER — Ambulatory Visit (INDEPENDENT_AMBULATORY_CARE_PROVIDER_SITE_OTHER): Payer: Medicare Other

## 2021-05-29 VITALS — BP 128/70 | HR 70 | Temp 98.2°F | Ht 63.0 in

## 2021-05-29 DIAGNOSIS — S2231XG Fracture of one rib, right side, subsequent encounter for fracture with delayed healing: Secondary | ICD-10-CM

## 2021-05-29 DIAGNOSIS — S2241XA Multiple fractures of ribs, right side, initial encounter for closed fracture: Secondary | ICD-10-CM | POA: Diagnosis not present

## 2021-05-29 MED ORDER — TRAMADOL HCL 50 MG PO TABS: 50.0000 mg | ORAL_TABLET | Freq: Three times a day (TID) | ORAL | 2 refills | Status: DC | PRN

## 2021-05-29 MED ORDER — DIAZEPAM 5 MG PO TABS: 5.0000 mg | ORAL_TABLET | Freq: Every day | ORAL | 5 refills | Status: DC

## 2021-05-29 MED ORDER — BENZONATATE 100 MG PO CAPS: ORAL_CAPSULE | ORAL | 0 refills | Status: DC

## 2021-05-29 NOTE — Assessment & Plan Note (Signed)
Acute Right sided rib pain x > 1 month Known right 4th rib fx Not healing - pain not improving - delayed healing ? More ribs broken than identified on first xray  Continue tramadol 50 mg 2-3 times a day-prescription adjusted since she was only prescribed this for twice a day-we will increase this to 3 times a day in case she needs it Continue diazepam 5 mg at bedtime as needed She still has a little bit of a residual cough from her cold-continue Tessalon Perles as needed Further evaluation based on x-ray results

## 2021-05-29 NOTE — Patient Instructions (Addendum)
°  Have an xray downstairs.   Medications changes include :   none  Your prescription(s) have been submitted to your pharmacy. Please take as directed and contact our office if you believe you are having problem(s) with the medication(s).

## 2021-05-30 ENCOUNTER — Ambulatory Visit: Payer: Medicare Other | Admitting: Internal Medicine

## 2021-06-07 DIAGNOSIS — N3010 Interstitial cystitis (chronic) without hematuria: Secondary | ICD-10-CM | POA: Diagnosis not present

## 2021-06-12 NOTE — Telephone Encounter (Signed)
Patient states she is still having chest congestion which provider is aware of  Patient states due to broken ribs it is too painful to cough  Patient states Dr. Maudie Mercury prescribed her benzonatate which helps w/ the cough but is not breaking up the congestion  Patient requesting a rx for congestion

## 2021-06-17 ENCOUNTER — Other Ambulatory Visit (HOSPITAL_COMMUNITY): Payer: Self-pay

## 2021-06-20 ENCOUNTER — Ambulatory Visit: Payer: Medicare Other

## 2021-06-20 ENCOUNTER — Telehealth: Payer: Self-pay | Admitting: Internal Medicine

## 2021-06-20 ENCOUNTER — Ambulatory Visit (INDEPENDENT_AMBULATORY_CARE_PROVIDER_SITE_OTHER): Payer: Medicare Other

## 2021-06-20 DIAGNOSIS — R0781 Pleurodynia: Secondary | ICD-10-CM | POA: Diagnosis not present

## 2021-06-20 DIAGNOSIS — S2243XA Multiple fractures of ribs, bilateral, initial encounter for closed fracture: Secondary | ICD-10-CM | POA: Diagnosis not present

## 2021-06-20 NOTE — Addendum Note (Signed)
Addended by: Binnie Rail on: 06/20/2021 04:07 PM   Modules accepted: Orders

## 2021-06-20 NOTE — Telephone Encounter (Signed)
She can come in today and have an xray.  Which side?

## 2021-06-20 NOTE — Telephone Encounter (Signed)
Pt suspects she has another broken rib and is requesting an appt w/ provider  Provider's next availability is 2-1, pt states she can not wait that long  Offered pt appt w/ another [facility] provider, pt states she will wait on provider's recommendations  Please advise

## 2021-06-20 NOTE — Telephone Encounter (Signed)
X-ray ordered.

## 2021-06-20 NOTE — Telephone Encounter (Signed)
Pt states she can come today and its the Rt side

## 2021-06-26 ENCOUNTER — Other Ambulatory Visit: Payer: Self-pay

## 2021-06-26 ENCOUNTER — Telehealth: Payer: Self-pay | Admitting: Physician Assistant

## 2021-06-26 ENCOUNTER — Ambulatory Visit: Payer: Medicare Other | Admitting: Physician Assistant

## 2021-06-26 DIAGNOSIS — Z85828 Personal history of other malignant neoplasm of skin: Secondary | ICD-10-CM

## 2021-06-26 DIAGNOSIS — D0462 Carcinoma in situ of skin of left upper limb, including shoulder: Secondary | ICD-10-CM

## 2021-06-26 DIAGNOSIS — Z1283 Encounter for screening for malignant neoplasm of skin: Secondary | ICD-10-CM

## 2021-06-26 DIAGNOSIS — L2084 Intrinsic (allergic) eczema: Secondary | ICD-10-CM | POA: Diagnosis not present

## 2021-06-26 DIAGNOSIS — L570 Actinic keratosis: Secondary | ICD-10-CM | POA: Diagnosis not present

## 2021-06-26 DIAGNOSIS — D485 Neoplasm of uncertain behavior of skin: Secondary | ICD-10-CM

## 2021-06-26 MED ORDER — TRIAMCINOLONE ACETONIDE 0.1 % EX CREA: 1.0000 "application " | TOPICAL_CREAM | Freq: Two times a day (BID) | CUTANEOUS | 3 refills | Status: DC | PRN

## 2021-06-26 NOTE — Patient Instructions (Signed)

## 2021-06-26 NOTE — Telephone Encounter (Signed)
Phone call to patient to inform her that she can use the cream anywhere she has the dermatitis. Patient aware.

## 2021-06-26 NOTE — Telephone Encounter (Signed)
Patient is calling to ask if she is supposed use the medication on each spot or the entire calf?  triamcinolone cream (KENALOG) 0.1 %

## 2021-06-27 ENCOUNTER — Encounter: Payer: Self-pay | Admitting: Physician Assistant

## 2021-06-27 ENCOUNTER — Telehealth: Payer: Self-pay | Admitting: Pharmacist

## 2021-06-27 ENCOUNTER — Other Ambulatory Visit (HOSPITAL_COMMUNITY): Payer: Self-pay

## 2021-06-27 DIAGNOSIS — M810 Age-related osteoporosis without current pathological fracture: Secondary | ICD-10-CM

## 2021-06-27 DIAGNOSIS — Z79899 Other long term (current) drug therapy: Secondary | ICD-10-CM

## 2021-06-27 DIAGNOSIS — E559 Vitamin D deficiency, unspecified: Secondary | ICD-10-CM

## 2021-06-27 NOTE — Telephone Encounter (Signed)
Patient is due for Prolia on or after 06/30/21. Called patient   Copay through pharmacy benefit is $300. No osteoporosis grants currently open. Reviewed medical benefits coverage and 20% coinsurance. Patient aware that I cannot quote her price through medical benefit. She would like to receive through pharmacy benefit as before.   She will stop by Prolia labs on 06/28/21. Future lab orders placed for CBC, CMP, and Vitamin D today. Once labs result and if wnl, will send rx to [facility] to be couriered to clinic  Patient has appt on 07/23/21  Knox Saliva, PharmD, MPH, BCPS Clinical Pharmacist (Rheumatology and Pulmonology)

## 2021-06-28 ENCOUNTER — Other Ambulatory Visit: Payer: Self-pay

## 2021-06-28 ENCOUNTER — Other Ambulatory Visit: Payer: Self-pay | Admitting: *Deleted

## 2021-06-28 DIAGNOSIS — Z8639 Personal history of other endocrine, nutritional and metabolic disease: Secondary | ICD-10-CM

## 2021-06-28 DIAGNOSIS — Z79899 Other long term (current) drug therapy: Secondary | ICD-10-CM

## 2021-06-28 DIAGNOSIS — M810 Age-related osteoporosis without current pathological fracture: Secondary | ICD-10-CM

## 2021-06-28 DIAGNOSIS — E559 Vitamin D deficiency, unspecified: Secondary | ICD-10-CM | POA: Diagnosis not present

## 2021-06-28 LAB — CBC WITH DIFFERENTIAL/PLATELET
Absolute Monocytes: 400 cells/uL (ref 200–950)
Basophils Absolute: 32 cells/uL (ref 0–200)
Basophils Relative: 0.7 %
Eosinophils Absolute: 18 cells/uL (ref 15–500)
Eosinophils Relative: 0.4 %
HCT: 38.6 % (ref 35.0–45.0)
Hemoglobin: 13 g/dL (ref 11.7–15.5)
Lymphs Abs: 1352 cells/uL (ref 850–3900)
MCH: 33.3 pg — ABNORMAL HIGH (ref 27.0–33.0)
MCHC: 33.7 g/dL (ref 32.0–36.0)
MCV: 99 fL (ref 80.0–100.0)
MPV: 10.1 fL (ref 7.5–12.5)
Monocytes Relative: 8.7 %
Neutro Abs: 2797 cells/uL (ref 1500–7800)
Neutrophils Relative %: 60.8 %
Platelets: 300 10*3/uL (ref 140–400)
RBC: 3.9 10*6/uL (ref 3.80–5.10)
RDW: 12.4 % (ref 11.0–15.0)
Total Lymphocyte: 29.4 %
WBC: 4.6 10*3/uL (ref 3.8–10.8)

## 2021-06-28 LAB — COMPREHENSIVE METABOLIC PANEL
AG Ratio: 1.3 (calc) (ref 1.0–2.5)
ALT: 17 U/L (ref 6–29)
AST: 28 U/L (ref 10–35)
Albumin: 4 g/dL (ref 3.6–5.1)
Alkaline phosphatase (APISO): 37 U/L (ref 37–153)
BUN/Creatinine Ratio: 13 (calc) (ref 6–22)
BUN: 13 mg/dL (ref 7–25)
CO2: 29 mmol/L (ref 20–32)
Calcium: 9.7 mg/dL (ref 8.6–10.4)
Chloride: 99 mmol/L (ref 98–110)
Creat: 1.03 mg/dL — ABNORMAL HIGH (ref 0.60–1.00)
Globulin: 3.1 g/dL (calc) (ref 1.9–3.7)
Glucose, Bld: 90 mg/dL (ref 65–99)
Potassium: 4.8 mmol/L (ref 3.5–5.3)
Sodium: 136 mmol/L (ref 135–146)
Total Bilirubin: 0.7 mg/dL (ref 0.2–1.2)
Total Protein: 7.1 g/dL (ref 6.1–8.1)

## 2021-06-28 LAB — LIPID PANEL
Cholesterol: 214 mg/dL — ABNORMAL HIGH (ref <200)
HDL: 88 mg/dL (ref >=50)
LDL Cholesterol (Calc): 105 mg/dL (calc) — ABNORMAL HIGH
Non-HDL Cholesterol (Calc): 126 mg/dL (calc) (ref <130)
Total CHOL/HDL Ratio: 2.4 (calc) (ref <5.0)
Triglycerides: 110 mg/dL (ref <150)

## 2021-06-28 LAB — VITAMIN D 25 HYDROXY (VIT D DEFICIENCY, FRACTURES): Vit D, 25-Hydroxy: 49 ng/mL (ref 30–100)

## 2021-07-01 ENCOUNTER — Telehealth: Payer: Self-pay | Admitting: *Deleted

## 2021-07-01 ENCOUNTER — Other Ambulatory Visit (HOSPITAL_COMMUNITY): Payer: Self-pay

## 2021-07-01 MED ORDER — DENOSUMAB 60 MG/ML ~~LOC~~ SOSY
60.0000 mg | PREFILLED_SYRINGE | SUBCUTANEOUS | 0 refills | Status: DC
  Filled 2021-07-01 – 2021-07-03 (×2): qty 1, 180d supply, fill #0

## 2021-07-01 NOTE — Telephone Encounter (Signed)
Contacted patient to review her lab results. Patient states she is having pani in her left leg and hip and is having trouble walking. Patient is unsure if it may be her sciatic nerve. Should patient be evaluated here or with ortho?

## 2021-07-01 NOTE — Telephone Encounter (Signed)
Please advise the patient to schedule an appointment with her orthopedist.

## 2021-07-01 NOTE — Telephone Encounter (Signed)
Labs from 06/28/21 - Vitamin D wnl, calcium wnl, creatinine slightly elevated  Appt on 07/09/21 with Hazel Sams, PA-C. Will ensure Prolia is delivered to clinic by 07/09/21 and discussed with Wayne Sever who will administer at that office visit.  Knox Saliva, PharmD, MPH, BCPS Clinical Pharmacist (Rheumatology and Pulmonology)

## 2021-07-01 NOTE — Telephone Encounter (Signed)
Patient advised to schedule an appointment with her orthopedist.

## 2021-07-01 NOTE — Progress Notes (Signed)
CBC WNL.  Creatinine is borderline elevated-1.03.  Please encourage the patient to remain hydrated.  Rest of CMP WNL.   Calcium and Vitamin D are WNL.   LDL is borderline elevated and total cholesterol is slightly elevated. Please notify the patient and forward results to PCP.

## 2021-07-03 ENCOUNTER — Other Ambulatory Visit (HOSPITAL_COMMUNITY): Payer: Self-pay

## 2021-07-03 NOTE — Progress Notes (Signed)
Follow-Up Visit   Subjective  Sherri [name] is a 81 y.o. female who presents for the following: Annual Exam (New lesion on right hand x 3 months- "growing". Personal history of non mole skin cancers, but no melanoma. No family history of melanoma or non mole skin cancers.  ).   The following portions of the chart were reviewed this encounter and updated as appropriate:  Tobacco   Allergies   Meds   Problems   Med Hx   Surg Hx   Fam Hx       Objective  Well appearing patient in no apparent distress; mood and affect are within normal limits.  A full examination was performed including scalp, head, eyes, ears, nose, lips, neck, chest, axillae, abdomen, back, buttocks, bilateral upper extremities, bilateral lower extremities, hands, feet, fingers, toes, fingernails, and toenails. All findings within normal limits unless otherwise noted below.  Left Leg, Right Leg Oval/circular patches and plaques.    Right Buccal Cheek Erythematous patches with gritty scale.  Left Dorsal Hand Hyperkeratotic scale with pink base      Left forearm Hyperkeratotic scale with pink base       Assessment & Plan  Intrinsic atopic dermatitis Left Leg; Right Leg  triamcinolone cream (KENALOG) 0.1 % - Left Leg, Right Leg Apply 1 application topically 2 (two) times daily as needed.  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 (2) Left Dorsal Hand  Skin / nail biopsy Type of biopsy: tangential   Informed consent: discussed and consent obtained   Timeout: patient  name, date of birth, surgical site, and procedure verified   Procedure prep:  Patient was prepped and draped in usual sterile fashion (Non sterile) Prep type:  Chlorhexidine Anesthesia: the lesion was anesthetized in a standard fashion   Anesthetic:  1% lidocaine w/ epinephrine 1-100,000 local infiltration Instrument used: flexible razor blade   Outcome: patient tolerated procedure well   Post-procedure details: wound care instructions given    Destruction of lesion Complexity: simple   Destruction method: electrodesiccation and curettage   Informed consent: discussed and consent obtained   Timeout:  patient name, date of birth, surgical site, and procedure verified Anesthesia: the lesion was anesthetized in a standard fashion   Anesthetic:  1% lidocaine w/ epinephrine 1-100,000 local infiltration Curettage performed in three different directions: Yes   Electrodesiccation performed over the curetted area: Yes   Curettage cycles:  3 Margin per side (cm):  0.1 Final wound size (cm):  0.5 Hemostasis achieved with:  aluminum chloride Outcome: patient tolerated procedure well with no complications   Post-procedure details: wound care instructions given    Specimen 1 - Surgical pathology Differential Diagnosis: bcc vs scc-txpbx  Check Margins: No  Left forearm  Skin / nail biopsy Type of biopsy: tangential   Informed consent: discussed and consent obtained   Timeout: patient name, date of birth, surgical site, and procedure verified   Procedure prep:  Patient was prepped and draped in usual sterile fashion (Non sterile) Prep type:  Chlorhexidine Anesthesia: the lesion was anesthetized in a standard fashion  Anesthetic:  1% lidocaine w/ epinephrine 1-100,000 local infiltration Instrument used: flexible razor blade   Outcome: patient tolerated procedure well   Post-procedure details: wound care instructions given    Destruction of lesion Complexity: simple   Destruction method:  electrodesiccation and curettage   Informed consent: discussed and consent obtained   Timeout:  patient name, date of birth, surgical site, and procedure verified Anesthesia: the lesion was anesthetized in a standard fashion   Anesthetic:  1% lidocaine w/ epinephrine 1-100,000 local infiltration Curettage performed in three different directions: Yes   Electrodesiccation performed over the curetted area: Yes   Curettage cycles:  3 Margin per side (cm):  0.1 Final wound size (cm):  1.2 Hemostasis achieved with:  aluminum chloride Outcome: patient tolerated procedure well with no complications   Post-procedure details: wound care instructions given    Specimen 2 - Surgical pathology Differential Diagnosis: bcc vs scc-txpbx  Check Margins: No  No atypical nevi noted at the time of the visit.  I, Beniah Magnan, PA-C, have reviewed all documentation's for this visit.  The documentation on 07/03/21 for the exam, diagnosis, procedures and orders are all accurate and complete.

## 2021-07-03 NOTE — Progress Notes (Signed)
Office Visit Note  Patient: Sherri [name]             Date of Birth: 1941-03-09           MRN: [id number]             PCP: Binnie Rail, MD Referring: Binnie Rail, MD Visit Date: 07/09/2021 Occupation: @GUAROCC @  Subjective:  Low back pain   History of Present Illness: Sherri [name] is a 81 y.o. female with history of seropositive rheumatoid arthritis and osteoarthritis. She remains on [name] 15 mg 1 tablet by mouth daily, Methotrexate 0.6 ml sq injections once weekly, folic acid 1 mg p.o, prednisone 5 mg 1 tablet by mouth daily.  She is tolerating these medications without any side effects.  She has has been experiencing increased pain in both hands.  She has noticed intermittent swelling and stiffness in both hands.  She has been experiencing severe pain in her lower back the pain has been radiating down her left leg.  She is awaiting an appointment for an MRI of the lumbar spine for further evaluation.  She has an upcoming appointment with Dr. Ninfa Linden for further evaluation and management.. She has had to increase her dose of tramadol to manage her symptoms. She is due for her prolia injection today.   She denies any recent infections.  She has been seeing her dermatologist every 3 months.  She has had an increased frequency of squamous cell carcinomas.  She remains on lipitor as prescribed.  Activities of Daily Living:  Patient reports morning stiffness for all day. Patient Denies nocturnal pain.  Difficulty dressing/grooming: Reports Difficulty climbing stairs: Reports Difficulty getting out of chair: Reports Difficulty using hands for taps, buttons, cutlery, and/or writing: Reports  Review of Systems  Constitutional:  Positive for fatigue.  HENT:  Negative for mouth sores, mouth dryness and nose dryness.   Eyes:  Positive for dryness. Negative for pain and itching.  Respiratory:  Negative for shortness of breath and difficulty breathing.   Cardiovascular:   Negative for chest pain and palpitations.  Gastrointestinal:  Positive for constipation. Negative for blood in stool and diarrhea.  Endocrine: Negative for increased urination.  Genitourinary:  Negative for difficulty urinating.  Musculoskeletal:  Positive for joint pain, joint pain, myalgias, morning stiffness, muscle tenderness and myalgias. Negative for joint swelling.  Skin:  Negative for color change, rash and redness.  Allergic/Immunologic: Positive for susceptible to infections.  Neurological:  Positive for dizziness and weakness. Negative for numbness, headaches and memory loss.  Hematological:  Positive for bruising/bleeding tendency.  Psychiatric/Behavioral:  Negative for confusion.    PMFS History:  Patient Active Problem List   Diagnosis Date Noted   Acute left lumbar radiculopathy 07/05/2021   Closed fracture of rib of right side with delayed healing 05/29/2021   Senile purpura ([place]) 12/19/2020   Restless leg 02/02/2020   Bilateral leg edema 01/11/2020   Cervical radiculopathy 06/28/2019   Carpal tunnel syndrome on both sides 06/28/2019   Right sided sciatica 04/14/2018   Trochanteric bursitis of right hip 04/14/2018   S/P cervical spinal fusion 11/10/2017   Dysphagia 10/26/2017   Gastroesophageal reflux disease 08/06/2017   Easy bruising 08/06/2017   Mid back pain 03/26/2017   Submandibular gland swelling 01/08/2017   High risk medication use 09/02/2016   Piriformis muscle pain 09/02/2016   Right hip pain 04/08/2016   Right knee pain 02/25/2016   Numbness 11/14/2014   Biceps tendon tear 10/10/2014  Insomnia 10/10/2014   Gait disturbance 06/13/2014   OP (osteoporosis) 01/18/2014   Other long term (current) drug therapy 01/18/2014   Hoarseness 12/29/2013   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 01/01/2013   Personal history of colonic polyps 07/03/2010   Nocturia 05/28/2010   THYROID NODULE 05/17/2008   Multiple sclerosis ([place]) 05/17/2008   Other fatigue  03/30/2007   Hyperlipidemia 08/20/2006   Essential hypertension 08/20/2006   Rheumatoid arthritis ([place]) 08/20/2006   CERVICAL CANCER, HX OF 08/20/2006   GASTRIC ULCER, HX OF 08/20/2006    Past Medical History:  Diagnosis Date   Anxiety    takes Valium daily as needed   Basal cell carcinoma 01/21/1988   left nostril (MOHS), sup-right calf (CX35FU)   Basal cell carcinoma 03/22/1996   right calf (CX35FU)   Basal cell carcinoma 03/31/1995   left wing nose   Bruises easily    d/t meds   Cancer (HCC)    basal cell ca, in situ- uterine    Cataracts, bilateral    removed bilateral   Chronic back pain    Dizziness    r/t to meds   GERD (gastroesophageal reflux disease)    no meds on a regular basis but will take Tums if needed   Headache(784.0)    r/t neck issues   History of bronchitis 6-28yrs ago   History of colon polyps    benign   Hyperlipidemia    takes Atorvastatin on Mondays and Fridays   Hypertension    takes Ramipril daily   Joint pain    Joint swelling    Multiple sclerosis ([place])    Neuromuscular disorder ([place])    Dr. Park Liter- [facility], follows M.S.   Nocturia    PONV (postoperative nausea and vomiting)    trouble urinating after surgery in 2014   Postoperative nausea and vomiting 01/11/2019   Rheumatoid arthritis ([place])    Dr Jani Gravel weekly, RA- hands- knees- feet    Rheumatoid arthritis(714.0)    Dr Estanislado Pandy takes [name] daily   Right wrist fracture    Skin cancer    Squamous cell carcinoma of skin 11/02/2001   in situ-right knee (cx61fu)   Squamous cell carcinoma of skin 11/12/2011   in situ-left forearm (CX35FU), in situ-left foot (CX35FU)   Squamous cell carcinoma of skin 01/22/2012   in situ-left lower forearm (txpbx)   Squamous cell carcinoma of skin 11/17/2012   right shin (txpbx)   Squamous cell carcinoma of skin 10/28/2013   in situ-right shoulder (CX35FU)   Squamous cell carcinoma of skin 04/28/2014   well diff-right  shin (txpbx)   Squamous cell carcinoma of skin 11/02/2014   in situ-Left shin (txpbx)   Squamous cell carcinoma of skin 12/15/2014   in situ-Left hand (txpbx), bowens-left side chest (txpbx)   Squamous cell carcinoma of skin 05/09/2015   in situ-left hand (txpbx)    Squamous cell carcinoma of skin 05/07/2016   in situ-left inner shin,ant, in situ-left inner shin, post, in situ-left outer forearm, in situ-right knuckle   Squamous cell carcinoma of skin 03/1302018   in situ-left shoulder (txpbx), in situ-right inner shin (txpbx), in situ-top of left foot (txpbx), KA- left forearm (txpbx)   Squamous cell carcinoma of skin 12/02/2016   in situ-left inner shin (txpbx)   Squamous cell carcinoma of skin 03/04/2017   in situ-left outer sup, shin (txpbx), in situ- right 2nd knuckle finger (txpbx), in situ- Left wrist (txpbx)   Squamous cell carcinoma of  skin 04/06/2018   in situ-above left knee inner (txpbx)   Squamous cell carcinoma of skin 04/21/2018   in situ-right top hand (txpbx)   Squamous cell carcinoma of skin 07/08/2018   in situ-right lower inner shin (txpbx)   Squamous cell carcinoma of skin 04/27/2019   in situ- Left neck(CX35FU), In situ- right neck (CX35FU)   Urinary retention    sees Dr.Wrenn about 2 times a yr    Family History  Problem Relation Age of Onset   Cancer Mother        pancreatic   Diabetes Mother    Pancreatic cancer Mother    Heart disease Father        Rheumatic   Cancer Father        ? stomach   Stomach cancer Father    Cancer Sister        stomach   Stomach cancer Sister    Cancer Maternal Aunt        X 4; ? primary   Heart disease Paternal Aunt    Cancer Maternal Grandmother        cervical   Colon cancer Other        Aunts   Multiple sclerosis Daughter    Hypertension Neg Hx    Stroke Neg Hx    Esophageal cancer Neg Hx    Rectal cancer Neg Hx    Past Surgical History:  Procedure Laterality Date   ABDOMINAL HYSTERECTOMY     1985    APPENDECTOMY     with TAH   BACK SURGERY     several   BREAST BIOPSY Right    Results were negative   BROW LIFT Bilateral 10/02/2016   Procedure: BILATERAL LOWER LID BLEPHAROPLASTY;  Surgeon: Clista Bernhardt, MD;  Location: [place];  Service: Plastics;  Laterality: Bilateral;   CARPAL TUNNEL RELEASE Right    cataracts     CERVICAL FUSION     Dr Joya Salm   CERVICAL FUSION  12/31/2012   Dr Joya Salm   CHEST TUBE INSERTION     for traumatic Pneumothorax   COLONOSCOPY  07/16/2010   normal    COLONOSCOPY W/ POLYPECTOMY  1997   negative since; Dr Deatra Ina   ESOPHAGOGASTRODUODENOSCOPY  07/16/2010   normal   eye lid raise     EYE SURGERY Bilateral    cataracts removed - /w IOL   JOINT REPLACEMENT     LUMBAR FUSION     Dr Joya Salm   NASAL SINUS SURGERY     POSTERIOR CERVICAL FUSION/FORAMINOTOMY N/A 12/31/2012   Procedure: POSTERIOR LATERAL CERVICAL FUSION/FORAMINOTOMY LEVEL 1 CERVICAL THREE-FOUR WITH LATERAL MASS SCREWS;  Surgeon: Floyce Stakes, MD;  Location: [place] [facility];  Service: Neurosurgery;  Laterality: N/A;   PTOSIS REPAIR Bilateral 10/02/2016   Procedure: INTERNAL PTOSIS REPAIR;  Surgeon: Clista Bernhardt, MD;  Location: [place];  Service: Plastics;  Laterality: Bilateral;   SKIN BIOPSY     THYROID SURGERY     R lobe removed- 1972, has grown back - CT last done- 2018   TOTAL HIP ARTHROPLASTY  12/22/2011   Procedure: TOTAL HIP ARTHROPLASTY;  Surgeon: Kerin Salen, MD;  Location: [place];  Service: Orthopedics;  Laterality: Left;   TOTAL SHOULDER ARTHROPLASTY     TUBAL LIGATION     UPPER GASTROINTESTINAL ENDOSCOPY  2012   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   Social History   Social History Narrative   Not on file  Immunization History  Administered Date(s) Administered   Fluad Quad(high Dose 65+) 02/02/2020   Influenza Whole 02/22/2007, 02/08/2013   Influenza, High Dose Seasonal PF 02/04/2015, 02/18/2017, 02/21/2018, 01/16/2019, 01/25/2021   Influenza-Unspecified 01/24/2014,  01/25/2016, 02/18/2017, 01/16/2019, 01/25/2020   PFIZER(Purple Top)SARS-COV-2 Vaccination 06/18/2019, 07/09/2019, 01/09/2020, 07/03/2020, 02/07/2021   Pfizer Covid-19 Vaccine Bivalent Booster 37yrs & up 02/08/2021   Pneumococcal Conjugate-13 11/23/2012, 09/08/2014   Pneumococcal Polysaccharide-23 05/26/2006   Pneumococcal-Unspecified 12/28/2012, 06/09/2020   Td 05/28/2010   Tdap 12/24/2020, 01/07/2021   Unspecified SARS-COV-2 Vaccination 06/26/2020   Varicella 11/07/2020   Zoster Recombinat (Shingrix) 02/10/2019, 04/14/2019   Zoster, Live 05/26/2009, 07/10/2020     Objective: Vital Signs: BP (!) 149/78 (BP Location: Left Arm, Patient Position: Sitting, Cuff Size: Normal)    Pulse 61    Ht 5' 1.5" (1.562 m)    Wt 119 lb (54 kg)    BMI 22.12 kg/m    Physical Exam Vitals and nursing note reviewed.  Constitutional:      Appearance: She is well-developed.  HENT:     Head: Normocephalic and atraumatic.  Eyes:     Conjunctiva/sclera: Conjunctivae normal.  Pulmonary:     Effort: Pulmonary effort is normal.  Abdominal:     Palpations: Abdomen is soft.  Musculoskeletal:     Cervical back: Normal range of motion.  Skin:    General: Skin is warm and dry.     Capillary Refill: Capillary refill takes less than 2 seconds.  Neurological:     Mental Status: She is alert and oriented to person, place, and time.  Psychiatric:        Behavior: Behavior normal.     Musculoskeletal Exam: C-spine has limited range of motion.  Thoracic kyphosis noted.  Painful range of motion of the lumbar spine.  Midline spinal tenderness in the lumbar region and tenderness over the left SI joint was noted.  Shoulder joints, elbow joints, and wrist joints have good range of motion with no synovitis.  Tenderness over both wrist joints.  Tenderness and synovitis of the right fourth PIP joint bilaterally.  PIP and DIP thickening consistent with osteoarthritis of both hands.  Complete fist formation bilaterally.  Left  hip replacement has good range of motion with no groin pain currently.  Right hip has slightly limited range of motion.  Both knee joints have good range of motion with no warmth or effusion.  Ankle joints have good range of motion with no tenderness or joint swelling.  CDAI Exam: CDAI Score: 7  Patient Global: 5 mm; Provider Global: 5 mm Swollen: 2 ; Tender: 7  Joint Exam 07/09/2021      Right  Left  Wrist   Tender   Tender  PIP 4  Swollen Tender  Swollen Tender  Cervical Spine   Tender     Lumbar Spine   Tender     Sacroiliac      Tender     Investigation: No additional findings.  Imaging: DG Ribs Unilateral W/Chest Right  Result Date: 06/20/2021 CLINICAL DATA:  Follow-up right rib fractures.  Continued pain. EXAM: RIGHT RIBS AND CHEST - 3+ VIEW COMPARISON:  05/29/2021 FINDINGS: Cardiac silhouette and mediastinal contours are unchanged and within normal limits with calcification again seen within the aortic arch. There is again mild-to-moderate hyperinflation with flattening of the diaphragms. No pneumothorax. A BB marker overlies the inferior right hemithorax in the region of the posterior ninth rib and anterior right fourth and fifth ribs. Chronic posterior left fifth  and 6 healed rib fractures. There is subtle curvilinear lucencies corresponding to the previously seen anterolateral fourth, fifth, and sixth rib fractures. No new displacement. The right breast biopsy marking clip is noted similar to prior. IMPRESSION:: IMPRESSION: 1. Redemonstration of nondisplaced now subacute fourth through sixth rib fractures. No new displacement. 2. No pneumothorax. Electronically Signed   By: Yvonne Kendall M.D.   On: 06/20/2021 16:29   DG Lumbar Spine Complete  Result Date: 07/07/2021 CLINICAL DATA:  Acute left lumbar radiculopathy. Low back pain with radiculopathy down left leg. Acute on chronic back pain. EXAM: LUMBAR SPINE - COMPLETE 4+ VIEW COMPARISON:  Most recent lumbar radiograph 02/17/2018  FINDINGS: There are 5 lumbar type vertebra. Posterior rod with intrapedicular screw fusion L4 through S1 with interbody spacers. There is no periprosthetic lucency. Progressive L3-L4 degenerative disc disease, unchanged L3-L4 anterolisthesis. Progression in L3-L4 facet hypertrophy. Upper lumbar vertebral disc spaces are preserved. No evidence of fracture or focal bone abnormality. Advanced aortic atherosclerosis. IMPRESSION: 1. L4 through S1 posterior fusion with intact hardware. 2. Progressive degenerative disc disease and facet hypertrophy at L3-L4. Similar L3-L4 anterolisthesis to 2019 exam. Aortic Atherosclerosis (ICD10-I70.0). Electronically Signed   By: Keith Rake M.D.   On: 07/07/2021 17:21    Recent Labs: Lab Results  Component Value Date   WBC 4.6 06/28/2021   HGB 13.0 06/28/2021   PLT 300 06/28/2021   NA 136 06/28/2021   K 4.8 06/28/2021   CL 99 06/28/2021   CO2 29 06/28/2021   GLUCOSE 90 06/28/2021   BUN 13 06/28/2021   CREATININE 1.03 (H) 06/28/2021   BILITOT 0.7 06/28/2021   ALKPHOS 29 (L) 04/10/2020   AST 28 06/28/2021   ALT 17 06/28/2021   PROT 7.1 06/28/2021   ALBUMIN 4.2 04/10/2020   CALCIUM 9.7 06/28/2021   GFRAA 65 09/26/2020   QFTBGOLDPLUS NEGATIVE 09/26/2020    Speciality Comments: Prior therapy includes: [name] (cost), Olumiant-inadequate response, Imuran weakness, leflunomide-intolerance Prolia: 05/04/18, 11/12/18, 06/09/19, 12/07/19, 07/05/20  Procedures:  No procedures performed Allergies: Darvon [propoxyphene hcl], Meperidine hcl, Penicillins, Azathioprine, Sulfa antibiotics, Atorvastatin, Demerol [meperidine], Leflunomide, and Ezetimibe-simvastatin   Assessment / Plan:     Visit Diagnoses: Rheumatoid arthritis involving multiple sites with positive rheumatoid factor ([place]): She presents today with tenderness and synovitis of bilateral fourth PIP joints.  She has been experiencing some increased pain and stiffness in both hands over the past several  weeks.  She has some tenderness over both wrist joints but no synovitis was noted.  Overall her rheumatoid arthritis has been stable on the current treatment regimen: [name] 15 mg 1 tablet daily, methotrexate 0.6 mL sq injections once weekly, folic acid 1 mg daily, and prednisone 5 mg 1 tablet daily.  She is tolerating combination therapy without any side effects and has not had any recent infections.  No medication changes will be made at this time.  Discussed the risks of long-term prednisone use but she is unable to taper the dose of prednisone further.  She was advised to notify us if she develops increased inflammation.  She will continue to require close follow-up every 3 months.  High risk medication use - [name] 15 mg 1 tablet by mouth daily, Methotrexate 0.6 ml sq injections once weekly, folic acid 1 mg p.o, prednisone 5 mg 1 tablet by mouth daily.  CBC and CMP were stable on 06/28/2021.  Her next lab work will be due in May and every 3 months to monitor for drug toxicity.  Lipid panel  was updated on 06/28/2021: Total cholesterol was 214 and LDL was 105.  She remains on Lipitor 10 mg 1 tablet on Mondays and 1 tablet on Fridays.  TB Gold negative on 09/26/2020.  Future order for TB gold was placed today.- Plan: Lipid panel, QuantiFERON-TB Gold Plus She has not had any recent infections.  Discussed the importance of holding [name] and methotrexate if she develops signs or symptoms of an infection and to resume once the infection is completely cleared. She is aware of the black box warning associated with taking a Jak inhibitor including the increased risk for major cardiovascular events and malignancy.  She has been following up with her dermatologist every 3 months due to recurrent squamous cell carcinomas.  She has a follow-up visit with her dermatologist tomorrow.  Screening for tuberculosis -Future order for TB gold placed today.  Plan: QuantiFERON-TB Gold Plus  History of cellulitis - x3. She was  evaluated in the ED on 03/30/21 and was diagnosed with cellulitis of the left LE. No recurrence recently.  Age-related osteoporosis without current pathological fracture - DEXA: 01/16/2020 T-score: -4.2. Hx of frequent fractures.  Most recent fracture: Nondisplaced right rib fracture noted on x-rays from 04/22/2021.   Previous Prolia doses: 05/04/18, 11/12/18, 06/09/19, 12/07/19, 07/05/20, most recent was on 01/01/2021.  She remains on Prolia 60 mg subcutaneous injections every 6 months.  She is due for Prolia today.  She tolerated the injection without any side effects.  She continues to take calcium and vitamin D supplement on a daily basis.  Her vitamin D level was 49 on 06/28/2021. Due to update DEXA in August 2023.  New order for DEXA will be placed at her follow-up visit.  - Plan: denosumab (PROLIA) injection 60 mg  Vitamin D deficiency: Vitamin D was 49 on 06/28/2021.  She is taking vitamin D 2000 units daily.  Primary osteoarthritis of right hip: Slightly limited range of motion of the right hip joint.  No groin pain currently.  History of total hip replacement, left - Dr. Mayer Camel.  She has good range of motion of the left hip joint with no groin pain on examination today.  She has been experiencing radiating pain down her left leg coming from her lower back.  Her discomfort does not seem to be due to issues with the left hip replacement.  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: Chronic pain.  She has limited range of motion with lateral rotation especially to the left.  No symptoms of radiculopathy currently.  DDD (degenerative disc disease), lumbar: Chronic pain.  She has been experiencing severe discomfort in her lower back.  She has been having radiating pain down her left lower extremity.  She is followed up closely with Dr. Quay Burow.  X-rays of the lumbar spine were updated on 07/05/2021 which revealed L4-S1 posterior fusion with intact hardware.  Progressive degenerative disease and facet  hypertrophy at L3-L4 was noted.  She is currently awaiting approval for an MRI of the lumbar spine.  She has an upcoming appointment with Dr. Berniece Pap on 07/17/2021. She plans on continuing to take tramadol as needed for symptomatic relief.  Closed fracture of right wrist with routine healing, subsequent encounter: She has some residual tenderness over the right wrist joint.  No synovitis was noted on examination today.  Rib pain on right side: Patient was found to have a nondisplaced right lateral fourth rib fracture on 04/22/2021.  She continues to have some residual soreness on the right side of her  rib cage.  Pain in thoracic spine: Thoracic kyphosis was noted.  No midline spinal tenderness in the thoracic region at this time.  Primary insomnia: She takes Valium 5 mg at bedtime.  Other fatigue: She continues to have chronic fatigue secondary to insomnia.  She has been less active than normal due to severity of pain in her lower back which also has been contributing to her fatigue.  Other medical conditions are listed as follows:  History of hyperlipidemia: Lipid panel was updated on 06/28/2021: Cholesterol was 214, HDL 88, triglycerides 110, LDL 105.  She remains on Lipitor 10 mg 1 tablet on Mondays and 1 tablet on Fridays.  History of multiple sclerosis ([place]) - She is not a good candidate for TNF inhibitors.   History of hypertension  History of depression  History of anxiety   Orders: Orders Placed This Encounter  Procedures   Lipid panel   QuantiFERON-TB Gold Plus   Meds ordered this encounter  Medications   denosumab (PROLIA) injection 60 mg    Order Specific Question:   Patient is enrolled in [facility] program for this medication and I have provided a copy of the Prolia Medication Guide and Patient Brochure.    Answer:   Yes    Order Specific Question:   I have reviewed with the patient the information in the Prolia Medication Guide and Patient Counseling Chart  including the serious risks of Prolia and symptoms of each risk.    Answer:   Yes    Order Specific Question:   I have advised the patient to seek medical attention if they have signs or symptoms of any of the serious risks.    Answer:   Yes     Follow-Up Instructions: Return in about 3 months (around 10/06/2021) for Rheumatoid arthritis, Osteoarthritis.   Ofilia Neas, PA-C  Note - This record has been created using Dragon software.  Chart creation errors have been sought, but may not always  have been located. Such creation errors do not reflect on  the standard of medical care.

## 2021-07-03 NOTE — Telephone Encounter (Signed)
Email sent to [facility] to courier rx to clinic prior to appt on 07/09/21  Knox Saliva, PharmD, MPH, BCPS Clinical Pharmacist (Rheumatology and Pulmonology)

## 2021-07-04 ENCOUNTER — Telehealth: Payer: Self-pay | Admitting: Physician Assistant

## 2021-07-04 ENCOUNTER — Encounter: Payer: Self-pay | Admitting: Internal Medicine

## 2021-07-04 ENCOUNTER — Encounter: Payer: Self-pay | Admitting: Physician Assistant

## 2021-07-04 ENCOUNTER — Telehealth: Payer: Self-pay

## 2021-07-04 NOTE — Telephone Encounter (Signed)
I spoke with patient and informed her that per her conversation this morning regarding the triamcinolone cream that Robyne Askew is waiting on her to send a photo so she can decide which treatment option would be best.  Patient aware and states she will send a photo.

## 2021-07-04 NOTE — Progress Notes (Signed)
Subjective:    Patient ID: Sherri [name], female    DOB: Jul 28, 1940, 81 y.o.   MRN: [id number]  This visit occurred during the SARS-CoV-2 public health emergency.  Safety protocols were in place, including screening questions prior to the visit, additional usage of staff PPE, and extensive cleaning of exam room while observing appropriate contact time as indicated for disinfecting solutions.    HPI The patient is here for an acute visit.  Left hip and leg pain x 1 month -  her left hip is killing her.  The pain radiates down her leg.  That is the hip she had replaced .  When she first gets up int he morning she can not put any weigh ton it.  Then shehas to walk with a cane or walker.  After she is up on it for a 1/2 days she can walk but it is still painful.  The pain radiates down to her ankle now.  No low back pain.  She has left buttock pain.  It feel like sciatica.  No N/T.  Leg is weak.  No change in bowel/bladder  Tramadol helps.     Medications and allergies reviewed with patient and updated if appropriate.  Patient Active Problem List   Diagnosis Date Noted   Closed fracture of rib of right side with delayed healing 05/29/2021   Senile purpura ([place]) 12/19/2020   Restless leg 02/02/2020   Bilateral leg edema 01/11/2020   Cervical radiculopathy 06/28/2019   Carpal tunnel syndrome on both sides 06/28/2019   Right sided sciatica 04/14/2018   Trochanteric bursitis of right hip 04/14/2018   S/P cervical spinal fusion 11/10/2017   Dysphagia 10/26/2017   Gastroesophageal reflux disease 08/06/2017   Easy bruising 08/06/2017   Mid back pain 03/26/2017   Submandibular gland swelling 01/08/2017   High risk medication use 09/02/2016   Piriformis muscle pain 09/02/2016   Right hip pain 04/08/2016   Right knee pain 02/25/2016   Numbness 11/14/2014   Biceps tendon tear 10/10/2014   Insomnia 10/10/2014   Gait disturbance 06/13/2014   OP (osteoporosis) 01/18/2014   Other long  term (current) drug therapy 01/18/2014   Hoarseness 12/29/2013   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 01/01/2013   Personal history of colonic polyps 07/03/2010   Nocturia 05/28/2010   THYROID NODULE 05/17/2008   Multiple sclerosis ([place]) 05/17/2008   Other fatigue 03/30/2007   Hyperlipidemia 08/20/2006   Essential hypertension 08/20/2006   Rheumatoid arthritis ([place]) 08/20/2006   CERVICAL CANCER, HX OF 08/20/2006   GASTRIC ULCER, HX OF 08/20/2006    Current Outpatient Medications on File Prior to Visit  Medication Sig Dispense Refill   atorvastatin (LIPITOR) 10 MG tablet TAKE 1 TAB ON MONDAYS AND FRIDAY 27 tablet 3   Biotin 5000 MCG TABS Take 5,000 mcg by mouth daily.     Calcium Carbonate-Vitamin D (CALCIUM + D PO) Take 1 tablet by mouth daily.      Carboxymethylcellul-Glycerin (LUBRICATING EYE DROPS OP) Apply 1 drop to eye daily as needed (dry eyes).     carvedilol (COREG) 6.25 MG tablet TAKE 1 TABLET BY MOUTH 2 TIMES DAILY WITH A MEAL. 180 tablet 1   Cholecalciferol (VITAMIN D3) 2000 units capsule Take 2,000 Units by mouth daily.     denosumab (PROLIA) 60 MG/ML SOSY injection Inject 60 mg into the skin every 6 (six) months. Deliver to clinic: [address] [place], [place], [place] [address]. Appt on 07/09/21 1 mL 0  diazepam (VALIUM) 5 MG tablet Take 1 tablet (5 mg total) by mouth at bedtime. 30 tablet 5   diclofenac sodium (VOLTAREN) 1 % GEL APPLY 3 GRAMS TO 3 LARGE JOINTS 3 TIMES A DAY AS NEEDED 100 g 0   famotidine (PEPCID) 40 MG tablet Take 1 tablet (40 mg total) by mouth 2 (two) times daily. 60 tablet 11   folic acid (FOLVITE) 1 MG tablet Take 2 tablets (2 mg total) by mouth daily. 180 tablet 3   furosemide (LASIX) 20 MG tablet TAKE 1 TABLET BY MOUTH EVERY DAY 90 tablet 1   methenamine (HIPREX) 1 g tablet Take 1 g by mouth 2 (two) times daily.     methotrexate 50 MG/2ML injection INJECT 0.6 ML INTO THE SKIN ONCE WEEKLY. 8 mL 0   predniSONE (DELTASONE) 5 MG tablet TAKE 1  TABLET BY MOUTH EVERY DAY WITH BREAKFAST 90 tablet 0   psyllium (METAMUCIL) 58.6 % powder Take 1 packet by mouth as needed.     traMADol (ULTRAM) 50 MG tablet Take 1 tablet (50 mg total) by mouth 3 (three) times daily as needed for severe pain. 90 tablet 2   triamcinolone cream (KENALOG) 0.1 % Apply 1 application topically 2 (two) times daily as needed. 454 g 3   TUBERCULIN SYR 1CC/27GX1/2" 27G X 1/2" 1 ML MISC Use 1 syringe once weekly to inject methotrexate. 12 each 3   Upadacitinib ER 15 MG TB24 TAKE 1 TABLET (15 MG) BY MOUTH DAILY. 30 tablet 0   No current facility-administered medications on file prior to visit.    Past Medical History:  Diagnosis Date   Anxiety    takes Valium daily as needed   Basal cell carcinoma 01/21/1988   left nostril (MOHS), sup-right calf (CX35FU)   Basal cell carcinoma 03/22/1996   right calf (CX35FU)   Basal cell carcinoma 03/31/1995   left wing nose   Bruises easily    d/t meds   Cancer ([facility])    basal cell ca, in situ- uterine    Cataracts, bilateral    removed bilateral   Chronic back pain    Dizziness    r/t to meds   GERD (gastroesophageal reflux disease)    no meds on a regular basis but will take Tums if needed   Headache(784.0)    r/t neck issues   History of bronchitis 6-29yrs ago   History of colon polyps    benign   Hyperlipidemia    takes Atorvastatin on Mondays and Fridays   Hypertension    takes Ramipril daily   Joint pain    Joint swelling    Multiple sclerosis ([place])    Neuromuscular disorder ([place])    Dr. Park Liter- [facility], follows M.S.   Nocturia    PONV (postoperative nausea and vomiting)    trouble urinating after surgery in 2014   Postoperative nausea and vomiting 01/11/2019   Rheumatoid arthritis ([place])    Dr Jani Gravel weekly, RA- hands- knees- feet    Rheumatoid arthritis(714.0)    Dr Estanislado Pandy takes [name] daily   Right wrist fracture    Skin cancer    Squamous cell carcinoma of skin  11/02/2001   in situ-right knee (cx6fu)   Squamous cell carcinoma of skin 11/12/2011   in situ-left forearm (CX35FU), in situ-left foot (CX35FU)   Squamous cell carcinoma of skin 01/22/2012   in situ-left lower forearm (txpbx)   Squamous cell carcinoma of skin 11/17/2012   right shin (txpbx)  Squamous cell carcinoma of skin 10/28/2013   in situ-right shoulder (CX35FU)   Squamous cell carcinoma of skin 04/28/2014   well diff-right shin (txpbx)   Squamous cell carcinoma of skin 11/02/2014   in situ-Left shin (txpbx)   Squamous cell carcinoma of skin 12/15/2014   in situ-Left hand (txpbx), bowens-left side chest (txpbx)   Squamous cell carcinoma of skin 05/09/2015   in situ-left hand (txpbx)    Squamous cell carcinoma of skin 05/07/2016   in situ-left inner shin,ant, in situ-left inner shin, post, in situ-left outer forearm, in situ-right knuckle   Squamous cell carcinoma of skin 03/1302018   in situ-left shoulder (txpbx), in situ-right inner shin (txpbx), in situ-top of left foot (txpbx), KA- left forearm (txpbx)   Squamous cell carcinoma of skin 12/02/2016   in situ-left inner shin (txpbx)   Squamous cell carcinoma of skin 03/04/2017   in situ-left outer sup, shin (txpbx), in situ- right 2nd knuckle finger (txpbx), in situ- Left wrist (txpbx)   Squamous cell carcinoma of skin 04/06/2018   in situ-above left knee inner (txpbx)   Squamous cell carcinoma of skin 04/21/2018   in situ-right top hand (txpbx)   Squamous cell carcinoma of skin 07/08/2018   in situ-right lower inner shin (txpbx)   Squamous cell carcinoma of skin 04/27/2019   in situ- Left neck(CX35FU), In situ- right neck (CX35FU)   Urinary retention    sees Dr.Wrenn about 2 times a yr    Past Surgical History:  Procedure Laterality Date   Gate     with TAH   BACK SURGERY     several   BREAST BIOPSY Right    Results were negative   BROW LIFT Bilateral 10/02/2016    Procedure: BILATERAL LOWER LID BLEPHAROPLASTY;  Surgeon: Clista Bernhardt, MD;  Location: [facility];  Service: Plastics;  Laterality: Bilateral;   CARPAL TUNNEL RELEASE Right    cataracts     CERVICAL FUSION     Dr Joya Salm   CERVICAL FUSION  12/31/2012   Dr Joya Salm   CHEST TUBE INSERTION     for traumatic Pneumothorax   COLONOSCOPY  07/16/2010   normal    COLONOSCOPY W/ POLYPECTOMY  1997   negative since; Dr Deatra Ina   ESOPHAGOGASTRODUODENOSCOPY  07/16/2010   normal   eye lid raise     EYE SURGERY Bilateral    cataracts removed - /w IOL   JOINT REPLACEMENT     LUMBAR FUSION     Dr Joya Salm   NASAL SINUS SURGERY     POSTERIOR CERVICAL FUSION/FORAMINOTOMY N/A 12/31/2012   Procedure: POSTERIOR LATERAL CERVICAL FUSION/FORAMINOTOMY LEVEL 1 CERVICAL THREE-FOUR WITH LATERAL MASS SCREWS;  Surgeon: Floyce Stakes, MD;  Location: [facility];  Service: Neurosurgery;  Laterality: N/A;   PTOSIS REPAIR Bilateral 10/02/2016   Procedure: INTERNAL PTOSIS REPAIR;  Surgeon: Clista Bernhardt, MD;  Location: [facility];  Service: Plastics;  Laterality: Bilateral;   SKIN BIOPSY     THYROID SURGERY     R lobe removed- 1972, has grown back - CT last done- 2018   TOTAL HIP ARTHROPLASTY  12/22/2011   Procedure: TOTAL HIP ARTHROPLASTY;  Surgeon: Kerin Salen, MD;  Location: [facility];  Service: Orthopedics;  Laterality: Left;   TOTAL SHOULDER ARTHROPLASTY     TUBAL LIGATION     UPPER GASTROINTESTINAL ENDOSCOPY  2012   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  Social History   Socioeconomic History   Marital status: Married    Spouse name: Not on file   Number of children: Not on file   Years of education: Not on file   Highest education level: Not on file  Occupational History   Occupation: retired  Tobacco Use   Smoking status: Former    Packs/day: 2.50    Years: 20.00    Pack years: 50.00    Types: Cigarettes    Quit date: 05/26/1981    Years since quitting: 40.1   Smokeless tobacco: Never   Tobacco  comments:    Smoked 1963-1983, up to 2.5 ppd  Vaping Use   Vaping Use: Never used  Substance and Sexual Activity   Alcohol use: Yes    Alcohol/week: 14.0 standard drinks    Types: 14 Cans of beer per week    Comment: 2% beer   Drug use: Never   Sexual activity: Yes  Other Topics Concern   Not on file  Social History Narrative   Not on file   Social Determinants of Health   Financial Resource Strain: Not on file  Food Insecurity: No Food Insecurity   Worried About Running Out of Food in the Last Year: Never true   Ran Out of Food in the Last Year: Never true  Transportation Needs: No Transportation Needs   Lack of Transportation (Medical): No   Lack of Transportation (Non-Medical): No  Physical Activity: Inactive   Days of Exercise per Week: 0 days   Minutes of Exercise per Session: 0 min  Stress: No Stress Concern Present   Feeling of Stress : Not at all  Social Connections: Socially Integrated   Frequency of Communication with Friends and Family: More than three times a week   Frequency of Social Gatherings with Friends and Family: Once a week   Attends Religious Services: More than 4 times per year   Active Member of [employer] or Organizations: Yes   Attends Music therapist: More than 4 times per year   Marital Status: Married    Family History  Problem Relation Age of Onset   Cancer Mother        pancreatic   Diabetes Mother    Pancreatic cancer Mother    Heart disease Father        Rheumatic   Cancer Father        ? stomach   Stomach cancer Father    Cancer Sister        stomach   Stomach cancer Sister    Cancer Maternal Aunt        X 4; ? primary   Heart disease Paternal Aunt    Cancer Maternal Grandmother        cervical   Colon cancer Other        Aunts   Multiple sclerosis Daughter    Hypertension Neg Hx    Stroke Neg Hx    Esophageal cancer Neg Hx    Rectal cancer Neg Hx     Review of Systems     Objective:   Vitals:    07/05/21 1024  BP: (!) 146/80  Pulse: 87  Temp: 98.2 F (36.8 C)  SpO2: 99%   BP Readings from Last 3 Encounters:  07/05/21 (!) 146/80  05/29/21 128/70  04/22/21 131/76   Wt Readings from Last 3 Encounters:  04/22/21 121 lb (54.9 kg)  03/30/21 115 lb (52.2 kg)  02/27/21 119 lb (54 kg)  Body mass index is 21.43 kg/m.   Physical Exam Constitutional:      General: She is not in acute distress.    Appearance: Normal appearance. She is not ill-appearing.  HENT:     Head: Normocephalic and atraumatic.  Musculoskeletal:        General: Tenderness (Tenderness left lumbar region through buttock with palpation) present.     Right lower leg: No edema.     Left lower leg: No edema.  Skin:    General: Skin is warm and dry.  Neurological:     Mental Status: She is alert.     Sensory: No sensory deficit.     Comments: Unable to determine if there is weakness due to pain in leg.  Standing up and trying to walk increased pain.  Increased pain with lifting leg.           Assessment & Plan:    See Problem List for Assessment and Plan of chronic medical problems.

## 2021-07-04 NOTE — Telephone Encounter (Signed)
Wants to know if she should keep using the cream, Triamcinolone.

## 2021-07-04 NOTE — Telephone Encounter (Signed)
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

## 2021-07-04 NOTE — Telephone Encounter (Signed)
Prolia received. Placed in refrigerator  Knox Saliva, PharmD, MPH, BCPS Clinical Pharmacist (Rheumatology and Pulmonology)

## 2021-07-04 NOTE — Telephone Encounter (Signed)
Patient aware of results and leg seems to be worse with the treatment triamcinolone. I asked the patient to send a photo & Sherri [name] my change therapy.

## 2021-07-05 ENCOUNTER — Other Ambulatory Visit: Payer: Self-pay

## 2021-07-05 ENCOUNTER — Ambulatory Visit (INDEPENDENT_AMBULATORY_CARE_PROVIDER_SITE_OTHER): Payer: Medicare Other | Admitting: Internal Medicine

## 2021-07-05 ENCOUNTER — Ambulatory Visit (INDEPENDENT_AMBULATORY_CARE_PROVIDER_SITE_OTHER): Payer: Medicare Other

## 2021-07-05 VITALS — BP 146/80 | HR 87 | Temp 98.2°F | Ht 63.0 in

## 2021-07-05 DIAGNOSIS — M5416 Radiculopathy, lumbar region: Secondary | ICD-10-CM | POA: Diagnosis not present

## 2021-07-05 DIAGNOSIS — M545 Low back pain, unspecified: Secondary | ICD-10-CM | POA: Diagnosis not present

## 2021-07-05 NOTE — Assessment & Plan Note (Signed)
Acute No trauma History of back surgery in the past Left lower back radiating to left leg pain x1 month associated with leg weakness, no numbness/tingling Likely pinched nerve X-ray today of lumbar spine Will order MRI Referral for Dr. Ninfa Linden Tramadol is helping-continue 50 mg 3 times daily-can adjust if needed

## 2021-07-05 NOTE — Patient Instructions (Addendum)
° °  Have an xray of your back downstairs.   Medications changes include :  none   An MRI was ordered - they will call you to schedule this.    A referral was ordered for Dr Ninfa Linden.        Someone from their office will call you to schedule an appointment.

## 2021-07-08 ENCOUNTER — Encounter: Payer: Self-pay | Admitting: Internal Medicine

## 2021-07-09 ENCOUNTER — Encounter: Payer: Self-pay | Admitting: Physician Assistant

## 2021-07-09 ENCOUNTER — Telehealth: Payer: Self-pay | Admitting: Physician Assistant

## 2021-07-09 ENCOUNTER — Other Ambulatory Visit: Payer: Self-pay

## 2021-07-09 ENCOUNTER — Ambulatory Visit: Payer: Medicare Other | Admitting: Physician Assistant

## 2021-07-09 VITALS — BP 149/78 | HR 61 | Ht 61.5 in | Wt 119.0 lb

## 2021-07-09 DIAGNOSIS — Z111 Encounter for screening for respiratory tuberculosis: Secondary | ICD-10-CM

## 2021-07-09 DIAGNOSIS — Z79899 Other long term (current) drug therapy: Secondary | ICD-10-CM

## 2021-07-09 DIAGNOSIS — R5383 Other fatigue: Secondary | ICD-10-CM

## 2021-07-09 DIAGNOSIS — S62101D Fracture of unspecified carpal bone, right wrist, subsequent encounter for fracture with routine healing: Secondary | ICD-10-CM

## 2021-07-09 DIAGNOSIS — M1611 Unilateral primary osteoarthritis, right hip: Secondary | ICD-10-CM | POA: Diagnosis not present

## 2021-07-09 DIAGNOSIS — M51369 Other intervertebral disc degeneration, lumbar region without mention of lumbar back pain or lower extremity pain: Secondary | ICD-10-CM

## 2021-07-09 DIAGNOSIS — E559 Vitamin D deficiency, unspecified: Secondary | ICD-10-CM

## 2021-07-09 DIAGNOSIS — M810 Age-related osteoporosis without current pathological fracture: Secondary | ICD-10-CM

## 2021-07-09 DIAGNOSIS — G35 Multiple sclerosis: Secondary | ICD-10-CM

## 2021-07-09 DIAGNOSIS — M546 Pain in thoracic spine: Secondary | ICD-10-CM

## 2021-07-09 DIAGNOSIS — R0781 Pleurodynia: Secondary | ICD-10-CM

## 2021-07-09 DIAGNOSIS — M0579 Rheumatoid arthritis with rheumatoid factor of multiple sites without organ or systems involvement: Secondary | ICD-10-CM | POA: Diagnosis not present

## 2021-07-09 DIAGNOSIS — M4312 Spondylolisthesis, cervical region: Secondary | ICD-10-CM | POA: Diagnosis not present

## 2021-07-09 DIAGNOSIS — M5136 Other intervertebral disc degeneration, lumbar region: Secondary | ICD-10-CM | POA: Diagnosis not present

## 2021-07-09 DIAGNOSIS — Z8659 Personal history of other mental and behavioral disorders: Secondary | ICD-10-CM

## 2021-07-09 DIAGNOSIS — Z96642 Presence of left artificial hip joint: Secondary | ICD-10-CM | POA: Diagnosis not present

## 2021-07-09 DIAGNOSIS — Z8639 Personal history of other endocrine, nutritional and metabolic disease: Secondary | ICD-10-CM

## 2021-07-09 DIAGNOSIS — Z8679 Personal history of other diseases of the circulatory system: Secondary | ICD-10-CM

## 2021-07-09 DIAGNOSIS — Z872 Personal history of diseases of the skin and subcutaneous tissue: Secondary | ICD-10-CM | POA: Diagnosis not present

## 2021-07-09 DIAGNOSIS — F5101 Primary insomnia: Secondary | ICD-10-CM

## 2021-07-09 MED ORDER — DENOSUMAB 60 MG/ML ~~LOC~~ SOSY
60.0000 mg | PREFILLED_SYRINGE | Freq: Once | SUBCUTANEOUS | Status: AC
  Administered 2021-07-09: 60 mg via SUBCUTANEOUS

## 2021-07-09 NOTE — Progress Notes (Signed)
Subjective:   Patient presents to clinic today to receive bi-annual dose of Prolia.  Patient running a fever or have signs/symptoms of infection? No  Patient currently on antibiotics for the treatment of infection? No  Patient had fall in the last 6 months?  No  If yes, did it require medical attention? No   Patient taking calcium 1200 mg daily through diet or supplement and at least 800 units vitamin D? Yes  Objective: CMP     Component Value Date/Time   NA 136 06/28/2021 1337   NA 139 06/20/2019 1335   NA 136 02/22/2015 0000   K 4.8 06/28/2021 1337   K 4.4 02/22/2015 0000   CL 99 06/28/2021 1337   CL 98 02/22/2015 0000   CO2 29 06/28/2021 1337   CO2 30 02/22/2015 0000   GLUCOSE 90 06/28/2021 1337   BUN 13 06/28/2021 1337   BUN 10 06/20/2019 1335   CREATININE 1.03 (H) 06/28/2021 1337   CALCIUM 9.7 06/28/2021 1337   CALCIUM 9.6 02/22/2015 0000   PROT 7.1 06/28/2021 1337   PROT 6.4 06/20/2019 1335   PROT 6.6 02/22/2015 0000   ALBUMIN 4.2 04/10/2020 0914   ALBUMIN 4.2 06/20/2019 1335   ALBUMIN 4.3 02/22/2015 0000   AST 28 06/28/2021 1337   AST 24 02/22/2015 0000   ALT 17 06/28/2021 1337   ALT 18 02/22/2015 0000   ALKPHOS 29 (L) 04/10/2020 0914   ALKPHOS 34 02/22/2015 0000   BILITOT 0.7 06/28/2021 1337   BILITOT 0.3 06/20/2019 1335   GFRNONAA 59 (L) 03/30/2021 2317   GFRNONAA 56 (L) 09/26/2020 1536   GFRAA 65 09/26/2020 1536    CBC    Component Value Date/Time   WBC 4.6 06/28/2021 1337   RBC 3.90 06/28/2021 1337   HGB 13.0 06/28/2021 1337   HGB 13.0 06/20/2019 1335   HCT 38.6 06/28/2021 1337   HCT 38.0 06/20/2019 1335   PLT 300 06/28/2021 1337   PLT 363 06/20/2019 1335   MCV 99.0 06/28/2021 1337   MCV 100 (H) 06/20/2019 1335   MCH 33.3 (H) 06/28/2021 1337   MCHC 33.7 06/28/2021 1337   RDW 12.4 06/28/2021 1337   RDW 13.4 06/20/2019 1335   LYMPHSABS 1,352 06/28/2021 1337   LYMPHSABS 0.6 (L) 06/20/2019 1335   MONOABS 0.7 03/30/2021 2317   EOSABS 18  06/28/2021 1337   EOSABS 0.0 06/20/2019 1335   BASOSABS 32 06/28/2021 1337   BASOSABS 0.0 06/20/2019 1335    Lab Results  Component Value Date   VD25OH 49 06/28/2021    T-score: -4.2. DEXA: 01/16/2020   Assessment/Plan:   Administrations This Visit     denosumab (PROLIA) injection 60 mg     Admin Date 07/09/2021 Action Given Dose 60 mg Route Subcutaneous Administered By Carole Binning, LPN             Patient tolerated injection well.    All questions encouraged and answered.  Instructed patient to call with any further questions or concerns.

## 2021-07-09 NOTE — Telephone Encounter (Signed)
Returning Sherri [name] call

## 2021-07-09 NOTE — Patient Instructions (Signed)
Standing Labs °We placed an order today for your standing lab work.  ° °Please have your standing labs drawn in May and every 3 months  ° ° °If possible, please have your labs drawn 2 weeks prior to your appointment so that the provider can discuss your results at your appointment. ° °Please note that you may see your imaging and lab results in MyChart before we have reviewed them. °We may be awaiting multiple results to interpret others before contacting you. °Please allow our office up to 72 hours to thoroughly review all of the results before contacting the office for clarification of your results. ° °We have open lab daily: °Monday through Thursday from 1:30-4:30 PM and Friday from 1:30-4:00 PM °at the office of Dr. Shaili Deveshwar, [facility].   °Please be advised, all patients with office appointments requiring lab work will take precedent over walk-in lab work.  °If possible, please come for your lab work on Monday and Friday afternoons, as you may experience shorter wait times. °The office is located at [address], [address], , [place] [address] °No appointment is necessary.   °Labs are drawn by [facility]. Please bring your co-pay at the time of your lab draw.  You may receive a bill from [facility] for your lab work. ° °Please note if you are on Hydroxychloroquine and and an order has been placed for a Hydroxychloroquine level, you will need to have it drawn 4 hours or more after your last dose. ° °If you wish to have your labs drawn at another location, please call the office 24 hours in advance to send orders. ° °If you have any questions regarding directions or hours of operation,  °please call [phone].   °As a reminder, please drink plenty of water prior to coming for your lab work. Thanks! ° °

## 2021-07-10 ENCOUNTER — Ambulatory Visit: Payer: Medicare Other | Admitting: Physician Assistant

## 2021-07-10 ENCOUNTER — Other Ambulatory Visit: Payer: Self-pay

## 2021-07-10 DIAGNOSIS — Z5189 Encounter for other specified aftercare: Secondary | ICD-10-CM

## 2021-07-10 NOTE — Telephone Encounter (Signed)
Thank you :)

## 2021-07-11 ENCOUNTER — Other Ambulatory Visit (HOSPITAL_COMMUNITY): Payer: Self-pay

## 2021-07-11 ENCOUNTER — Encounter: Payer: Self-pay | Admitting: Internal Medicine

## 2021-07-11 DIAGNOSIS — M25551 Pain in right hip: Secondary | ICD-10-CM

## 2021-07-11 DIAGNOSIS — M25552 Pain in left hip: Secondary | ICD-10-CM

## 2021-07-12 ENCOUNTER — Ambulatory Visit (INDEPENDENT_AMBULATORY_CARE_PROVIDER_SITE_OTHER): Payer: Medicare Other

## 2021-07-12 DIAGNOSIS — M25551 Pain in right hip: Secondary | ICD-10-CM | POA: Diagnosis not present

## 2021-07-12 DIAGNOSIS — M25552 Pain in left hip: Secondary | ICD-10-CM | POA: Diagnosis not present

## 2021-07-12 NOTE — Addendum Note (Signed)
Addended by: Binnie Rail on: 07/12/2021 01:22 PM   Modules accepted: Orders

## 2021-07-13 ENCOUNTER — Encounter: Payer: Self-pay | Admitting: Internal Medicine

## 2021-07-17 ENCOUNTER — Encounter: Payer: Self-pay | Admitting: Orthopaedic Surgery

## 2021-07-17 ENCOUNTER — Ambulatory Visit: Payer: Medicare Other | Admitting: Orthopaedic Surgery

## 2021-07-17 ENCOUNTER — Encounter: Payer: Self-pay | Admitting: Internal Medicine

## 2021-07-17 ENCOUNTER — Telehealth: Payer: Self-pay | Admitting: Orthopaedic Surgery

## 2021-07-17 ENCOUNTER — Other Ambulatory Visit: Payer: Self-pay

## 2021-07-17 DIAGNOSIS — M5442 Lumbago with sciatica, left side: Secondary | ICD-10-CM

## 2021-07-17 DIAGNOSIS — G8929 Other chronic pain: Secondary | ICD-10-CM | POA: Diagnosis not present

## 2021-07-17 DIAGNOSIS — M5441 Lumbago with sciatica, right side: Secondary | ICD-10-CM

## 2021-07-17 DIAGNOSIS — M25559 Pain in unspecified hip: Secondary | ICD-10-CM

## 2021-07-17 NOTE — Telephone Encounter (Signed)
Pt had appt today with Dr. Ninfa Linden and was told the next steps were to sch for injs. Pt has upcoming mri appt on 07/25/21 and needed to know what Dr. Ninfa Linden recommends whether she keep that appt or cancel it since they will be doing injs. The best call back number is [phone]

## 2021-07-17 NOTE — Telephone Encounter (Signed)
Please advise 

## 2021-07-17 NOTE — Progress Notes (Signed)
The patient is a 81 year old female that I am seeing for the first time but she has a history of multiple lumbar spine surgeries by one of the neurosurgeons in town who is since retired.  She is actually seen someone else in that group since then.  She has had pain for 30 years she states.  She also has a left total hip arthroplasty done several years ago by Dr. Mayer Camel.  She says both her hips hurt but she points to the low back in the back of her pelvis as a source of her pain.  She denies any groin pain.  Both hips move smoothly and fluidly with no pain at all.  Her pain seems to be at the lower lumbar spine and the SI joints bilaterally.  Films on the canopy system show a significant lower lumbar fusion that is fused to the sacrum.  It is not fused to the SI joints.  She does have a left hip replacement appears well-seated with no complicating features.  Her right hip shows no arthritic changes.  I am at a loss of what we can offer her.  I will send her to Dr. Ernestina Patches to consider bilateral SI joint injections but have also requested that she follow-up with neurosurgery for her lumbar spine.  She agrees with this treatment plan.

## 2021-07-17 NOTE — Addendum Note (Signed)
Addended by: Robyne Peers on: 07/17/2021 03:35 PM   Modules accepted: Orders

## 2021-07-18 NOTE — Telephone Encounter (Signed)
Called pt and advised to schedule injs for after MRI. She stated understanding

## 2021-07-19 ENCOUNTER — Telehealth: Payer: Self-pay

## 2021-07-19 NOTE — Telephone Encounter (Signed)
Patient advised she may come by the office to pick up samples. Reserved samples in the cabinet for patient.

## 2021-07-19 NOTE — Telephone Encounter (Signed)
Medication Samples have been provided to the patient.  Drug name: [name]       Strength: 15 mg        Qty: 2  LOT: [id number]  Exp.Date: 03/08/2023  Dosing instructions: Take one tablet by mouth daily.   

## 2021-07-19 NOTE — Telephone Encounter (Signed)
Patient requested a return call to let her know if she can stop by the office today, 2/24 to pick up a sample of [name].

## 2021-07-23 ENCOUNTER — Ambulatory Visit: Payer: Medicare Other | Admitting: Rheumatology

## 2021-07-25 ENCOUNTER — Ambulatory Visit
Admission: RE | Admit: 2021-07-25 | Discharge: 2021-07-25 | Disposition: A | Discharge status: Home / Self Care | Payer: Medicare Other | Source: Ambulatory Visit | Attending: Internal Medicine | Admitting: Internal Medicine

## 2021-07-25 DIAGNOSIS — M5416 Radiculopathy, lumbar region: Secondary | ICD-10-CM

## 2021-07-25 DIAGNOSIS — M48061 Spinal stenosis, lumbar region without neurogenic claudication: Secondary | ICD-10-CM | POA: Diagnosis not present

## 2021-07-25 DIAGNOSIS — M545 Low back pain, unspecified: Secondary | ICD-10-CM | POA: Diagnosis not present

## 2021-07-26 ENCOUNTER — Encounter: Payer: Self-pay | Admitting: Internal Medicine

## 2021-07-29 ENCOUNTER — Encounter: Payer: Self-pay | Admitting: Physician Assistant

## 2021-07-29 NOTE — Progress Notes (Signed)
° °  Follow-Up Visit   Subjective  Sherri [name] is a 81 y.o. female who presents for the following: Follow-up (Here to follow up on the lesions on her legs. ).   The following portions of the chart were reviewed this encounter and updated as appropriate:  Tobacco   Allergies   Meds   Problems   Med Hx   Surg Hx   Fam Hx       Objective  Well appearing patient in no apparent distress; mood and affect are within normal limits.  A focused examination was performed including upper and lower extremities. . Relevant physical exam findings are noted in the Assessment and Plan.  legs Erythematous area with positive granulation tissue   Assessment & Plan  Visit for wound check legs  Discontinue triamcinolone.    I, Abriana Saltos, PA-C, have reviewed all documentation's for this visit.  The documentation on 07/29/21 for the exam, diagnosis, procedures and orders are all accurate and complete.

## 2021-07-31 ENCOUNTER — Telehealth: Payer: Self-pay | Admitting: Physical Medicine and Rehabilitation

## 2021-07-31 ENCOUNTER — Telehealth: Payer: Self-pay | Admitting: Internal Medicine

## 2021-07-31 DIAGNOSIS — M4310 Spondylolisthesis, site unspecified: Secondary | ICD-10-CM | POA: Diagnosis not present

## 2021-07-31 DIAGNOSIS — M48062 Spinal stenosis, lumbar region with neurogenic claudication: Secondary | ICD-10-CM | POA: Diagnosis not present

## 2021-07-31 DIAGNOSIS — M4802 Spinal stenosis, cervical region: Secondary | ICD-10-CM | POA: Diagnosis not present

## 2021-07-31 DIAGNOSIS — M5413 Radiculopathy, cervicothoracic region: Secondary | ICD-10-CM | POA: Diagnosis not present

## 2021-07-31 NOTE — Telephone Encounter (Signed)
BP has been well controlled - so it could be elevated due to pain.  ? ?Is she monitoring it at home - what has it been? What has her heart rate been? ? ? ?

## 2021-07-31 NOTE — Telephone Encounter (Signed)
Pt making provider aware she has to "have back surgery on L3 and L4 ." Performed by Dr. Annette Stable ? ?Pt states her bp at a recent ov was 194/90, pt inquiring if her bp medication needs to be changed or if bp was elevated due to back and leg pain ? ?Pt requesting a c/b ?

## 2021-07-31 NOTE — Telephone Encounter (Signed)
Pt called requesting a call back to cancel appt. Please call pt about this matter at [phone]. ?

## 2021-08-01 ENCOUNTER — Other Ambulatory Visit: Payer: Self-pay | Admitting: Neurosurgery

## 2021-08-01 ENCOUNTER — Other Ambulatory Visit: Payer: Self-pay

## 2021-08-01 ENCOUNTER — Encounter: Payer: Self-pay | Admitting: Physician Assistant

## 2021-08-01 ENCOUNTER — Ambulatory Visit: Payer: Medicare Other | Admitting: Physician Assistant

## 2021-08-01 DIAGNOSIS — L570 Actinic keratosis: Secondary | ICD-10-CM | POA: Diagnosis not present

## 2021-08-01 NOTE — Telephone Encounter (Signed)
Message left for patient to return call to clinic. ? ?If she calls back okay to relay Dr. Quay Burow response. ?

## 2021-08-01 NOTE — Progress Notes (Signed)
? ?  Follow-Up Visit ?  ?Subjective  ?Sherri [name] is a 81 y.o. female who presents for the following: Acne (Re check the lower legs and her back for raised crusty lesions). ? ? ?The following portions of the chart were reviewed this encounter and updated as appropriate:  Tobacco  Allergies  Meds  Problems  Med Hx  Surg Hx  Fam Hx   ?  ? ?Objective  ?Well appearing patient in no apparent distress; mood and affect are within normal limits. ? ?A full examination was performed including scalp, head, eyes, ears, nose, lips, neck, chest, axillae, abdomen, back, buttocks, bilateral upper extremities, bilateral lower extremities, hands, feet, fingers, toes, fingernails, and toenails. All findings within normal limits unless otherwise noted below. ? ?Left Arm (4), Left Upper Back (2), Right Arm (5) ?Erythematous patches with gritty scale. ? ? ?Assessment & Plan  ?AK (actinic keratosis) (11) ?Left Upper Back (2); Left Arm (4); Right Arm (5) ? ?Destruction of lesion - Left Arm, Left Upper Back, Right Arm ?Complexity: simple   ?Destruction method: cryotherapy   ?Informed consent: discussed and consent obtained   ?Timeout:  patient name, date of birth, surgical site, and procedure verified ?Lesion destroyed using liquid nitrogen: Yes   ?Cryotherapy cycles:  1 ?Outcome: patient tolerated procedure well with no complications   ?Post-procedure details: wound care instructions given   ? ?No atypical nevi noted at the time of the visit. ? ?I, Griffith Santilli, PA-C, have reviewed all documentation's for this visit.  The documentation on 08/01/21 for the exam, diagnosis, procedures and orders are all accurate and complete. ?

## 2021-08-01 NOTE — Telephone Encounter (Signed)
Spoke with patient today. March 27th she is set for surgery. ?

## 2021-08-01 NOTE — Telephone Encounter (Signed)
Patient returning nurse call ? ?Patient says her bp & heart rate has been normal up until she started experiencing this back pain recently ?

## 2021-08-06 ENCOUNTER — Ambulatory Visit: Payer: Medicare Other | Admitting: Physical Medicine and Rehabilitation

## 2021-08-07 ENCOUNTER — Ambulatory Visit: Payer: Medicare Other | Admitting: Physician Assistant

## 2021-08-08 ENCOUNTER — Other Ambulatory Visit: Payer: Self-pay | Admitting: Rheumatology

## 2021-08-08 DIAGNOSIS — H04123 Dry eye syndrome of bilateral lacrimal glands: Secondary | ICD-10-CM | POA: Diagnosis not present

## 2021-08-08 DIAGNOSIS — Z961 Presence of intraocular lens: Secondary | ICD-10-CM | POA: Diagnosis not present

## 2021-08-08 DIAGNOSIS — H5211 Myopia, right eye: Secondary | ICD-10-CM | POA: Diagnosis not present

## 2021-08-08 NOTE — Telephone Encounter (Signed)
Next Visit: 10/03/2021 ? ?Last Visit: 07/09/2021 ? ?Last Fill: 05/07/2021 ? ?Dx: Rheumatoid arthritis involving multiple sites with positive rheumatoid factor  ? ?Current Dose per office note on 07/09/2021: prednisone 5 mg 1 tablet by mouth daily ? ?Okay to refill Prednisone?   ?

## 2021-08-12 ENCOUNTER — Telehealth: Payer: Self-pay | Admitting: Rheumatology

## 2021-08-12 NOTE — Telephone Encounter (Signed)
Patient advised she may come by the office to pick up samples of [name]. Samples reserved in the cabinet for patient.  ?

## 2021-08-12 NOTE — Telephone Encounter (Signed)
Patient called the office stating she would like to pick samples of [name] if possible. [phone] ?

## 2021-08-12 NOTE — Telephone Encounter (Signed)
Medication Samples have been provided to the patient. ? ?Drug name: [name]       Strength: 15 mg        Qty: 2 LOT: [id number]   Exp.Date: 09/05/2022 ? ?Dosing instructions: Take one tablet by mouth daily.   ?

## 2021-08-13 ENCOUNTER — Telehealth: Payer: Self-pay

## 2021-08-13 NOTE — Telephone Encounter (Signed)
Take miralax daily - can take 2 times daily if needed.  Continue metamucil daily  Take stool softener - if colace 3/day. Continue high fiber diet/lots of water.  Ok to take laxative if needed. ?

## 2021-08-13 NOTE — Telephone Encounter (Signed)
Pt is calling to get a daily regimen to get constipation under control. Pt states that the only thing that seems to work is laxatives. Pt is surgery scheduled on Monday and doesn't want to be constipated.  ? ?I offered the pt an appt she declined stating that Dr. Quay Burow knows my conditions and I believe she will help me without an appt.  ? ?Please advise. Pt CB [phone]   ?

## 2021-08-14 DIAGNOSIS — M4316 Spondylolisthesis, lumbar region: Secondary | ICD-10-CM | POA: Diagnosis not present

## 2021-08-14 NOTE — Progress Notes (Addendum)
Surgical Instructions ? ? ? Your procedure is scheduled on Monday March 27th. ? Report to [facility] Main Entrance "A" at 6 A.M., then check in with the Admitting office. ? Call this number if you have problems the morning of surgery: ? [phone] ? ? If you have any questions prior to your surgery date call [phone]: Open Monday-Friday 8am-4pm ? ? ? Remember: ? Do not eat or drink after midnight the night before your surgery ? ?  ? Take these medicines the morning of surgery with A SIP OF WATER: ?atorvastatin (LIPITOR) 10 MG tablet ?carvedilol (COREG) 6.25 MG tablet ?famotidine (PEPCID) 40 MG tablet ?predniSONE (DELTASONE) 5 MG tablet ? ? ?IF NEEDED  ?Carboxymethylcellul-Glycerin (LUBRICATING EYE DROPS OP) ?traMADol (ULTRAM) 50 MG tablet ? ? ? ?As of today, STOP taking any Aspirin (unless otherwise instructed by your surgeon) Voltaren, Aleve, Naproxen, Ibuprofen, Motrin, Advil, Goody's, BC's, all herbal medications, fish oil, and all vitamins. ? ?         ?Do not wear jewelry or makeup ?Do not wear lotions, powders, perfumes, or deodorant. ?Do not shave 48 hours prior to surgery.   ?Do not bring valuables to the hospital. ?Do not wear nail polish, gel polish, artificial nails, or any other type of covering on natural nails (fingers and toes) ?If you have artificial nails or gel coating that need to be removed by a nail salon, please have this removed prior to surgery. Artificial nails or gel coating may interfere with anesthesia's ability to adequately monitor your vital signs. ? ?[facility] is not responsible for any belongings or valuables. .  ? ?Do NOT Smoke (Tobacco/Vaping)  24 hours prior to your procedure ? ?If you use a CPAP at night, you may bring your mask for your overnight stay. ?  ?Contacts, glasses, hearing aids, dentures or partials may not be worn into surgery, please bring cases for these belongings ?  ?For patients admitted to the hospital, discharge time will be determined by your treatment  team. ?  ?Patients discharged the day of surgery will not be allowed to drive home, and someone needs to stay with them for 24 hours. ? ? ?SURGICAL WAITING ROOM VISITATION ?Patients having surgery or a procedure in a hospital may have two support people. ?Children under the age of 52 must have an adult with them who is not the patient. ?They may stay in the waiting area during the procedure and may switch out with other visitors. If the patient needs to stay at the hospital during part of their recovery, the visitor guidelines for inpatient rooms apply. ? ?Please refer to the [facility] website for the visitor guidelines for Inpatients (after your surgery is over and you are in a regular room).  ? ? ? ? ? ?Special instructions:   ? ?Oral Hygiene is also important to reduce your risk of infection.  Remember - BRUSH YOUR TEETH THE MORNING OF SURGERY WITH YOUR REGULAR TOOTHPASTE ? ? ?[facility]- Preparing For Surgery ? ?Before surgery, you can play an important role. Because skin is not sterile, your skin needs to be as free of germs as possible. You can reduce the number of germs on your skin by washing with CHG (chlorahexidine gluconate) Soap before surgery.  CHG is an antiseptic cleaner which kills germs and bonds with the skin to continue killing germs even after washing.   ? ? ?Please do not use if you have an allergy to CHG or antibacterial soaps. If your skin becomes  reddened/irritated stop using the CHG.  ?Do not shave (including legs and underarms) for at least 48 hours prior to first CHG shower. It is OK to shave your face. ? ?Please follow these instructions carefully. ?  ? ? Shower the NIGHT BEFORE SURGERY and the MORNING OF SURGERY with CHG Soap.  ? If you chose to wash your hair, wash your hair first as usual with your normal shampoo. After you shampoo, rinse your hair and body thoroughly to remove the shampoo.  Then [employer] and genitals (private parts) with your normal soap and rinse thoroughly to  remove soap. ? ?After that Use CHG Soap as you would any other liquid soap. You can apply CHG directly to the skin and wash gently with a scrungie or a clean washcloth.  ? ?Apply the CHG Soap to your body ONLY FROM THE NECK DOWN.  Do not use on open wounds or open sores. Avoid contact with your eyes, ears, mouth and genitals (private parts). Wash Face and genitals (private parts)  with your normal soap.  ? ?Wash thoroughly, paying special attention to the area where your surgery will be performed. ? ?Thoroughly rinse your body with warm water from the neck down. ? ?DO NOT shower/wash with your normal soap after using and rinsing off the CHG Soap. ? ?Pat yourself dry with a CLEAN TOWEL. ? ?Wear CLEAN PAJAMAS to bed the night before surgery ? ?Place CLEAN SHEETS on your bed the night before your surgery ? ?DO NOT SLEEP WITH PETS. ? ? ?Day of Surgery: ? ?Take a shower with CHG soap. ?Wear Clean/Comfortable clothing the morning of surgery ?Do not apply any deodorants/lotions.   ?Remember to brush your teeth WITH YOUR REGULAR TOOTHPASTE. ? ? ? ?If you received a COVID test during your pre-op visit  it is requested that you wear a mask when out in public, stay away from anyone that may not be feeling well and notify your surgeon if you develop symptoms. If you have been in contact with anyone that has tested positive in the last 10 days please notify you surgeon. ? ?  ?Please read over the following fact sheets that you were given.  ? ?

## 2021-08-14 NOTE — Telephone Encounter (Signed)
Spoke with patient today. 

## 2021-08-15 ENCOUNTER — Encounter (HOSPITAL_COMMUNITY): Payer: Self-pay

## 2021-08-15 ENCOUNTER — Encounter (HOSPITAL_COMMUNITY)
Admission: RE | Admit: 2021-08-15 | Discharge: 2021-08-15 | Disposition: A | Discharge status: Home / Self Care | Payer: Medicare Other | Source: Ambulatory Visit | Attending: Neurosurgery | Admitting: Neurosurgery

## 2021-08-15 ENCOUNTER — Other Ambulatory Visit: Payer: Self-pay

## 2021-08-15 VITALS — BP 116/71 | HR 70 | Temp 98.2°F | Resp 17 | Ht 61.0 in | Wt 119.1 lb

## 2021-08-15 DIAGNOSIS — Z01818 Encounter for other preprocedural examination: Secondary | ICD-10-CM | POA: Diagnosis not present

## 2021-08-15 DIAGNOSIS — I10 Essential (primary) hypertension: Secondary | ICD-10-CM | POA: Insufficient documentation

## 2021-08-15 LAB — BASIC METABOLIC PANEL
Anion gap: 6 (ref 5–15)
BUN: 10 mg/dL (ref 8–23)
CO2: 26 mmol/L (ref 22–32)
Calcium: 8.7 mg/dL — ABNORMAL LOW (ref 8.9–10.3)
Chloride: 104 mmol/L (ref 98–111)
Creatinine, Ser: 0.87 mg/dL (ref 0.44–1.00)
GFR, Estimated: >60 mL/min (ref >60)
Glucose, Bld: 97 mg/dL (ref 70–99)
Potassium: 4.1 mmol/L (ref 3.5–5.1)
Sodium: 136 mmol/L (ref 135–145)

## 2021-08-15 LAB — CBC
HCT: 37.4 % (ref 36.0–46.0)
Hemoglobin: 12.2 g/dL (ref 12.0–15.0)
MCH: 33.8 pg (ref 26.0–34.0)
MCHC: 32.6 g/dL (ref 30.0–36.0)
MCV: 103.6 fL — ABNORMAL HIGH (ref 80.0–100.0)
Platelets: 225 10*3/uL (ref 150–400)
RBC: 3.61 MIL/uL — ABNORMAL LOW (ref 3.87–5.11)
RDW: 15.4 % (ref 11.5–15.5)
WBC: 4.2 10*3/uL (ref 4.0–10.5)
nRBC: 0 % (ref 0.0–0.2)

## 2021-08-15 LAB — TYPE AND SCREEN
ABO/RH(D): A POS
Antibody Screen: NEGATIVE

## 2021-08-15 LAB — SURGICAL PCR SCREEN
MRSA, PCR: NEGATIVE
Staphylococcus aureus: NEGATIVE

## 2021-08-15 NOTE — Progress Notes (Signed)
PCP - Billey Gosling ?Cardiologist - Denies ? ?Chest x-ray - Not indicated, did have some xrays in January of 2023 showing some healed rib fractures. Feeling sore there today. L lateral chest.  ?EKG - 08/15/21 ?Stress Test - 02/25/2001 ?ECHO - 04/20/17 ?Cardiac Cath - Denies ? ?Sleep Study - Denies ? ?DM - Denies ? ?Anesthesia review: No ? ?Patient denies shortness of breath, fever, cough and chest pain at PAT appointment ? ? ?All instructions explained to the patient, with a verbal understanding of the material. Patient agrees to go over the instructions while at home for a better understanding.The opportunity to ask questions was provided. ? ? ?

## 2021-08-19 ENCOUNTER — Other Ambulatory Visit: Payer: Self-pay

## 2021-08-19 ENCOUNTER — Encounter (HOSPITAL_COMMUNITY)
Admission: RE | Disposition: A | Discharge status: Home / Self Care | Payer: Self-pay | Source: Home / Self Care | Attending: Neurosurgery

## 2021-08-19 ENCOUNTER — Encounter (HOSPITAL_COMMUNITY): Payer: Self-pay | Admitting: Neurosurgery

## 2021-08-19 ENCOUNTER — Inpatient Hospital Stay (HOSPITAL_COMMUNITY)
Admission: RE | Admit: 2021-08-19 | Discharge: 2021-08-20 | DRG: 455 | Disposition: A | Discharge status: Home / Self Care | Payer: Medicare Other | Attending: Neurosurgery | Admitting: Neurosurgery

## 2021-08-19 ENCOUNTER — Inpatient Hospital Stay (HOSPITAL_COMMUNITY): Payer: Medicare Other

## 2021-08-19 DIAGNOSIS — G35 Multiple sclerosis: Secondary | ICD-10-CM | POA: Diagnosis present

## 2021-08-19 DIAGNOSIS — M48061 Spinal stenosis, lumbar region without neurogenic claudication: Secondary | ICD-10-CM | POA: Diagnosis not present

## 2021-08-19 DIAGNOSIS — Z888 Allergy status to other drugs, medicaments and biological substances status: Secondary | ICD-10-CM | POA: Diagnosis not present

## 2021-08-19 DIAGNOSIS — Z9851 Tubal ligation status: Secondary | ICD-10-CM

## 2021-08-19 DIAGNOSIS — Z8249 Family history of ischemic heart disease and other diseases of the circulatory system: Secondary | ICD-10-CM | POA: Diagnosis not present

## 2021-08-19 DIAGNOSIS — I10 Essential (primary) hypertension: Secondary | ICD-10-CM | POA: Diagnosis present

## 2021-08-19 DIAGNOSIS — M069 Rheumatoid arthritis, unspecified: Secondary | ICD-10-CM | POA: Diagnosis present

## 2021-08-19 DIAGNOSIS — Z85828 Personal history of other malignant neoplasm of skin: Secondary | ICD-10-CM | POA: Diagnosis not present

## 2021-08-19 DIAGNOSIS — Z880 Allergy status to penicillin: Secondary | ICD-10-CM

## 2021-08-19 DIAGNOSIS — M4316 Spondylolisthesis, lumbar region: Secondary | ICD-10-CM | POA: Diagnosis present

## 2021-08-19 DIAGNOSIS — M4326 Fusion of spine, lumbar region: Secondary | ICD-10-CM | POA: Diagnosis not present

## 2021-08-19 DIAGNOSIS — Z981 Arthrodesis status: Secondary | ICD-10-CM | POA: Diagnosis not present

## 2021-08-19 DIAGNOSIS — Z820 Family history of epilepsy and other diseases of the nervous system: Secondary | ICD-10-CM

## 2021-08-19 DIAGNOSIS — Z7952 Long term (current) use of systemic steroids: Secondary | ICD-10-CM | POA: Diagnosis not present

## 2021-08-19 DIAGNOSIS — E785 Hyperlipidemia, unspecified: Secondary | ICD-10-CM | POA: Diagnosis present

## 2021-08-19 DIAGNOSIS — Z79899 Other long term (current) drug therapy: Secondary | ICD-10-CM | POA: Diagnosis not present

## 2021-08-19 DIAGNOSIS — I739 Peripheral vascular disease, unspecified: Secondary | ICD-10-CM | POA: Diagnosis present

## 2021-08-19 DIAGNOSIS — Z96612 Presence of left artificial shoulder joint: Secondary | ICD-10-CM | POA: Diagnosis not present

## 2021-08-19 DIAGNOSIS — Z90710 Acquired absence of both cervix and uterus: Secondary | ICD-10-CM

## 2021-08-19 DIAGNOSIS — Z882 Allergy status to sulfonamides status: Secondary | ICD-10-CM

## 2021-08-19 DIAGNOSIS — M4310 Spondylolisthesis, site unspecified: Principal | ICD-10-CM

## 2021-08-19 DIAGNOSIS — Z87891 Personal history of nicotine dependence: Secondary | ICD-10-CM | POA: Diagnosis not present

## 2021-08-19 DIAGNOSIS — K219 Gastro-esophageal reflux disease without esophagitis: Secondary | ICD-10-CM | POA: Diagnosis present

## 2021-08-19 DIAGNOSIS — M5416 Radiculopathy, lumbar region: Secondary | ICD-10-CM | POA: Diagnosis not present

## 2021-08-19 DIAGNOSIS — M5116 Intervertebral disc disorders with radiculopathy, lumbar region: Secondary | ICD-10-CM | POA: Diagnosis not present

## 2021-08-19 SURGERY — POSTERIOR LUMBAR FUSION 1 WITH HARDWARE REMOVAL
Anesthesia: General | Site: Spine Lumbar

## 2021-08-19 MED ORDER — PHENYLEPHRINE HCL-NACL 20-0.9 MG/250ML-% IV SOLN
INTRAVENOUS | Status: DC | PRN
Start: 2021-08-19 — End: 2021-08-19
  Administered 2021-08-19: 30 ug/min via INTRAVENOUS

## 2021-08-19 MED ORDER — FUROSEMIDE 20 MG PO TABS
20.0000 mg | ORAL_TABLET | Freq: Every day | ORAL | Status: DC
  Filled 2021-08-19: qty 1

## 2021-08-19 MED ORDER — LIDOCAINE 2% (20 MG/ML) 5 ML SYRINGE
INTRAMUSCULAR | Status: DC | PRN
  Administered 2021-08-19: 60 mg via INTRAVENOUS

## 2021-08-19 MED ORDER — THROMBIN 20000 UNITS EX SOLR
CUTANEOUS | Status: AC
  Filled 2021-08-19: qty 20000

## 2021-08-19 MED ORDER — SUGAMMADEX SODIUM 200 MG/2ML IV SOLN
INTRAVENOUS | Status: DC | PRN
  Administered 2021-08-19: 200 mg via INTRAVENOUS

## 2021-08-19 MED ORDER — LACTATED RINGERS IV SOLN: INTRAVENOUS | Status: DC | PRN

## 2021-08-19 MED ORDER — MENTHOL 3 MG MT LOZG: 1.0000 | LOZENGE | OROMUCOSAL | Status: DC | PRN

## 2021-08-19 MED ORDER — HYDROMORPHONE HCL 1 MG/ML IJ SOLN: 1.0000 mg | INTRAMUSCULAR | Status: DC | PRN

## 2021-08-19 MED ORDER — ONDANSETRON HCL 4 MG/2ML IJ SOLN: 4.0000 mg | Freq: Four times a day (QID) | INTRAMUSCULAR | Status: DC | PRN

## 2021-08-19 MED ORDER — LACTATED RINGERS IV SOLN: INTRAVENOUS | Status: DC

## 2021-08-19 MED ORDER — FAMOTIDINE 20 MG PO TABS
40.0000 mg | ORAL_TABLET | Freq: Two times a day (BID) | ORAL | Status: DC
  Administered 2021-08-19 – 2021-08-20 (×3): 40 mg via ORAL
  Filled 2021-08-19 (×3): qty 2

## 2021-08-19 MED ORDER — CHLORHEXIDINE GLUCONATE CLOTH 2 % EX PADS: 6.0000 | MEDICATED_PAD | Freq: Once | CUTANEOUS | Status: DC

## 2021-08-19 MED ORDER — FENTANYL CITRATE (PF) 100 MCG/2ML IJ SOLN
25.0000 ug | INTRAMUSCULAR | Status: DC | PRN
  Administered 2021-08-19: 50 ug via INTRAVENOUS
  Administered 2021-08-19 (×2): 25 ug via INTRAVENOUS

## 2021-08-19 MED ORDER — PROPOFOL 500 MG/50ML IV EMUL
INTRAVENOUS | Status: DC | PRN
  Administered 2021-08-19: 25 ug/kg/min via INTRAVENOUS

## 2021-08-19 MED ORDER — BUPIVACAINE HCL (PF) 0.25 % IJ SOLN
INTRAMUSCULAR | Status: DC | PRN
  Administered 2021-08-19: 20 mL

## 2021-08-19 MED ORDER — THROMBIN 20000 UNITS EX SOLR: CUTANEOUS | Status: DC | PRN

## 2021-08-19 MED ORDER — ONDANSETRON HCL 4 MG PO TABS: 4.0000 mg | ORAL_TABLET | Freq: Four times a day (QID) | ORAL | Status: DC | PRN

## 2021-08-19 MED ORDER — LIDOCAINE 2% (20 MG/ML) 5 ML SYRINGE
INTRAMUSCULAR | Status: AC
  Filled 2021-08-19: qty 5

## 2021-08-19 MED ORDER — FENTANYL CITRATE (PF) 100 MCG/2ML IJ SOLN
INTRAMUSCULAR | Status: AC
  Filled 2021-08-19: qty 2

## 2021-08-19 MED ORDER — PROPOFOL 10 MG/ML IV BOLUS
INTRAVENOUS | Status: AC
  Filled 2021-08-19: qty 20

## 2021-08-19 MED ORDER — VANCOMYCIN HCL IN DEXTROSE 1-5 GM/200ML-% IV SOLN
1000.0000 mg | INTRAVENOUS | Status: AC
  Administered 2021-08-19: 1000 mg via INTRAVENOUS
  Filled 2021-08-19: qty 200

## 2021-08-19 MED ORDER — UPADACITINIB ER 15 MG PO TB24: 15.0000 mg | ORAL_TABLET | Freq: Every day | ORAL | Status: DC

## 2021-08-19 MED ORDER — VANCOMYCIN HCL 1000 MG IV SOLR
INTRAVENOUS | Status: AC
  Filled 2021-08-19: qty 20

## 2021-08-19 MED ORDER — ROCURONIUM BROMIDE 10 MG/ML (PF) SYRINGE
PREFILLED_SYRINGE | INTRAVENOUS | Status: DC | PRN
  Administered 2021-08-19: 10 mg via INTRAVENOUS
  Administered 2021-08-19: 5 mg via INTRAVENOUS
  Administered 2021-08-19: 50 mg via INTRAVENOUS

## 2021-08-19 MED ORDER — AMISULPRIDE (ANTIEMETIC) 5 MG/2ML IV SOLN: 10.0000 mg | Freq: Once | INTRAVENOUS | Status: DC | PRN

## 2021-08-19 MED ORDER — SODIUM CHLORIDE 0.9% FLUSH: 3.0000 mL | INTRAVENOUS | Status: DC | PRN

## 2021-08-19 MED ORDER — DEXAMETHASONE SODIUM PHOSPHATE 10 MG/ML IJ SOLN
INTRAMUSCULAR | Status: AC
  Filled 2021-08-19: qty 1

## 2021-08-19 MED ORDER — FENTANYL CITRATE (PF) 250 MCG/5ML IJ SOLN
INTRAMUSCULAR | Status: DC | PRN
  Administered 2021-08-19: 100 ug via INTRAVENOUS
  Administered 2021-08-19: 50 ug via INTRAVENOUS

## 2021-08-19 MED ORDER — CARBOXYMETHYLCELLUL-GLYCERIN 0.5-0.9 % OP SOLN
1.0000 [drp] | Freq: Every day | OPHTHALMIC | Status: DC | PRN
  Filled 2021-08-19: qty 15

## 2021-08-19 MED ORDER — ACETAMINOPHEN 650 MG RE SUPP
650.0000 mg | RECTAL | Status: DC | PRN
Start: 2021-08-19 — End: 2021-08-20

## 2021-08-19 MED ORDER — FOLIC ACID 1 MG PO TABS
2.0000 mg | ORAL_TABLET | Freq: Every day | ORAL | Status: DC
  Administered 2021-08-19: 2 mg via ORAL
  Filled 2021-08-19: qty 2

## 2021-08-19 MED ORDER — SODIUM CHLORIDE 0.9 % IV SOLN
250.0000 mL | INTRAVENOUS | Status: DC
  Administered 2021-08-19: 250 mL via INTRAVENOUS

## 2021-08-19 MED ORDER — HYDROCODONE-ACETAMINOPHEN 10-325 MG PO TABS
1.0000 | ORAL_TABLET | ORAL | Status: DC | PRN
  Administered 2021-08-19 – 2021-08-20 (×3): 1 via ORAL
  Filled 2021-08-19 (×3): qty 1

## 2021-08-19 MED ORDER — OYSTER SHELL CALCIUM/D3 500-5 MG-MCG PO TABS
2.0000 | ORAL_TABLET | Freq: Every day | ORAL | Status: DC
  Administered 2021-08-20: 2 via ORAL
  Filled 2021-08-19: qty 2

## 2021-08-19 MED ORDER — SODIUM CHLORIDE 0.9% FLUSH
3.0000 mL | Freq: Two times a day (BID) | INTRAVENOUS | Status: DC
  Administered 2021-08-19 (×2): 3 mL via INTRAVENOUS

## 2021-08-19 MED ORDER — DIAZEPAM 5 MG PO TABS
5.0000 mg | ORAL_TABLET | Freq: Four times a day (QID) | ORAL | Status: DC | PRN
  Administered 2021-08-19 – 2021-08-20 (×3): 5 mg via ORAL
  Filled 2021-08-19 (×3): qty 1

## 2021-08-19 MED ORDER — OXYCODONE HCL 5 MG PO TABS
10.0000 mg | ORAL_TABLET | ORAL | Status: DC | PRN
  Administered 2021-08-19 (×2): 10 mg via ORAL
  Filled 2021-08-19 (×2): qty 2

## 2021-08-19 MED ORDER — PSYLLIUM 95 % PO PACK
1.0000 | PACK | Freq: Every day | ORAL | Status: DC | PRN
  Filled 2021-08-19: qty 1

## 2021-08-19 MED ORDER — BIOTIN 5000 MCG PO TABS
5000.0000 ug | ORAL_TABLET | Freq: Every day | ORAL | Status: DC
Start: 2021-08-19 — End: 2021-08-19

## 2021-08-19 MED ORDER — DEXAMETHASONE SODIUM PHOSPHATE 10 MG/ML IJ SOLN
INTRAMUSCULAR | Status: DC | PRN
  Administered 2021-08-19: 10 mg via INTRAVENOUS

## 2021-08-19 MED ORDER — PROPOFOL 10 MG/ML IV BOLUS
INTRAVENOUS | Status: DC | PRN
  Administered 2021-08-19: 90 mg via INTRAVENOUS

## 2021-08-19 MED ORDER — ONDANSETRON HCL 4 MG/2ML IJ SOLN
INTRAMUSCULAR | Status: AC
  Filled 2021-08-19: qty 2

## 2021-08-19 MED ORDER — VANCOMYCIN HCL 1000 MG IV SOLR
INTRAVENOUS | Status: DC | PRN
  Administered 2021-08-19: 1000 mg

## 2021-08-19 MED ORDER — ROCURONIUM BROMIDE 10 MG/ML (PF) SYRINGE
PREFILLED_SYRINGE | INTRAVENOUS | Status: AC
  Filled 2021-08-19: qty 10

## 2021-08-19 MED ORDER — ONDANSETRON HCL 4 MG/2ML IJ SOLN
INTRAMUSCULAR | Status: DC | PRN
  Administered 2021-08-19: 4 mg via INTRAVENOUS

## 2021-08-19 MED ORDER — PHENOL 1.4 % MT LIQD: 1.0000 | OROMUCOSAL | Status: DC | PRN

## 2021-08-19 MED ORDER — ACETAMINOPHEN 325 MG PO TABS: 650.0000 mg | ORAL_TABLET | ORAL | Status: DC | PRN

## 2021-08-19 MED ORDER — POLYETHYLENE GLYCOL 3350 17 G PO PACK
17.0000 g | PACK | Freq: Every day | ORAL | Status: DC | PRN
  Administered 2021-08-19: 17 g via ORAL
  Filled 2021-08-19: qty 1

## 2021-08-19 MED ORDER — PHENYLEPHRINE HCL-NACL 20-0.9 MG/250ML-% IV SOLN
INTRAVENOUS | Status: AC
  Filled 2021-08-19: qty 500

## 2021-08-19 MED ORDER — BUPIVACAINE HCL (PF) 0.25 % IJ SOLN
INTRAMUSCULAR | Status: AC
  Filled 2021-08-19: qty 30

## 2021-08-19 MED ORDER — BISACODYL 10 MG RE SUPP: 10.0000 mg | Freq: Every day | RECTAL | Status: DC | PRN

## 2021-08-19 MED ORDER — CALCIUM-VITAMIN D-VITAMIN K 500-1000-40 MG-UNT-MCG PO CHEW: CHEWABLE_TABLET | Freq: Every day | ORAL | Status: DC

## 2021-08-19 MED ORDER — ORAL CARE MOUTH RINSE: 15.0000 mL | Freq: Once | OROMUCOSAL | Status: AC

## 2021-08-19 MED ORDER — VITAMIN D 25 MCG (1000 UNIT) PO TABS
2000.0000 [IU] | ORAL_TABLET | Freq: Every day | ORAL | Status: DC
  Administered 2021-08-19: 2000 [IU] via ORAL
  Filled 2021-08-19: qty 2

## 2021-08-19 MED ORDER — TRAMADOL HCL 50 MG PO TABS: 50.0000 mg | ORAL_TABLET | Freq: Three times a day (TID) | ORAL | Status: DC | PRN

## 2021-08-19 MED ORDER — FLEET ENEMA 7-19 GM/118ML RE ENEM: 1.0000 | ENEMA | Freq: Once | RECTAL | Status: DC | PRN

## 2021-08-19 MED ORDER — 0.9 % SODIUM CHLORIDE (POUR BTL) OPTIME
TOPICAL | Status: DC | PRN
  Administered 2021-08-19: 1000 mL

## 2021-08-19 MED ORDER — VANCOMYCIN HCL IN DEXTROSE 1-5 GM/200ML-% IV SOLN
1000.0000 mg | Freq: Once | INTRAVENOUS | Status: AC
  Administered 2021-08-19: 1000 mg via INTRAVENOUS
  Filled 2021-08-19: qty 200

## 2021-08-19 MED ORDER — CHLORHEXIDINE GLUCONATE 0.12 % MT SOLN
15.0000 mL | Freq: Once | OROMUCOSAL | Status: AC
  Administered 2021-08-19: 15 mL via OROMUCOSAL
  Filled 2021-08-19: qty 15

## 2021-08-19 MED ORDER — POLYVINYL ALCOHOL 1.4 % OP SOLN
1.0000 [drp] | Freq: Every day | OPHTHALMIC | Status: DC | PRN
  Filled 2021-08-19: qty 15

## 2021-08-19 MED ORDER — ACETAMINOPHEN 500 MG PO TABS
1000.0000 mg | ORAL_TABLET | Freq: Once | ORAL | Status: AC
  Administered 2021-08-19: 1000 mg via ORAL
  Filled 2021-08-19: qty 2

## 2021-08-19 MED ORDER — CARVEDILOL 6.25 MG PO TABS
6.2500 mg | ORAL_TABLET | Freq: Two times a day (BID) | ORAL | Status: DC
  Administered 2021-08-19 – 2021-08-20 (×2): 6.25 mg via ORAL
  Filled 2021-08-19 (×2): qty 1

## 2021-08-19 MED ORDER — FENTANYL CITRATE (PF) 250 MCG/5ML IJ SOLN
INTRAMUSCULAR | Status: AC
  Filled 2021-08-19: qty 5

## 2021-08-19 MED ORDER — PREDNISONE 5 MG PO TABS
5.0000 mg | ORAL_TABLET | Freq: Every day | ORAL | Status: DC
  Administered 2021-08-20: 5 mg via ORAL
  Filled 2021-08-19: qty 1

## 2021-08-19 SURGICAL SUPPLY — 67 items
ADH SKN CLS APL DERMABOND .7 (GAUZE/BANDAGES/DRESSINGS) ×1
APL SKNCLS STERI-STRIP NONHPOA (GAUZE/BANDAGES/DRESSINGS) ×1
BAG COUNTER SPONGE SURGICOUNT (BAG) ×3 IMPLANT
BAG DECANTER FOR FLEXI CONT (MISCELLANEOUS) ×2 IMPLANT
BAG SPNG CNTER NS LX DISP (BAG) ×1
BENZOIN TINCTURE PRP APPL 2/3 (GAUZE/BANDAGES/DRESSINGS) ×3 IMPLANT
BLADE CLIPPER SURG (BLADE) IMPLANT
BONE GRAFTON DBF INJECT 9CC (Bone Implant) ×1 IMPLANT
BUR CUTTER 7.0 ROUND (BURR) ×4 IMPLANT
BUR MATCHSTICK NEURO 3.0 LAGG (BURR) ×3 IMPLANT
CAGE EXP CATALYFT 9 (Plate) ×2 IMPLANT
CANISTER SUCT 3000ML PPV (MISCELLANEOUS) ×3 IMPLANT
CAP LCK SPNE (Orthopedic Implant) ×4 IMPLANT
CAP LOCK SPINE RADIUS (Orthopedic Implant) IMPLANT
CAP LOCKING (Orthopedic Implant) ×8 IMPLANT
CARTRIDGE OIL MAESTRO DRILL (MISCELLANEOUS) ×2 IMPLANT
CLSR STERI-STRIP ANTIMIC 1/2X4 (GAUZE/BANDAGES/DRESSINGS) ×1 IMPLANT
CNTNR URN SCR LID CUP LEK RST (MISCELLANEOUS) ×2 IMPLANT
CONT SPEC 4OZ STRL OR WHT (MISCELLANEOUS) ×2
COVER BACK TABLE 60X90IN (DRAPES) ×3 IMPLANT
DECANTER SPIKE VIAL GLASS SM (MISCELLANEOUS) ×2 IMPLANT
DERMABOND ADVANCED (GAUZE/BANDAGES/DRESSINGS) ×1
DERMABOND ADVANCED .7 DNX12 (GAUZE/BANDAGES/DRESSINGS) ×2 IMPLANT
DIFFUSER DRILL AIR PNEUMATIC (MISCELLANEOUS) ×3 IMPLANT
DRAPE C-ARM 42X72 X-RAY (DRAPES) ×6 IMPLANT
DRAPE HALF SHEET 40X57 (DRAPES) IMPLANT
DRAPE LAPAROTOMY 100X72X124 (DRAPES) ×3 IMPLANT
DRAPE SURG 17X23 STRL (DRAPES) ×9 IMPLANT
DRSG OPSITE POSTOP 4X6 (GAUZE/BANDAGES/DRESSINGS) ×1 IMPLANT
DURAPREP 26ML APPLICATOR (WOUND CARE) ×3 IMPLANT
ELECT BLADE 4.0 EZ CLEAN MEGAD (MISCELLANEOUS) ×2
ELECT REM PT RETURN 9FT ADLT (ELECTROSURGICAL) ×2
ELECTRODE BLDE 4.0 EZ CLN MEGD (MISCELLANEOUS) IMPLANT
ELECTRODE REM PT RTRN 9FT ADLT (ELECTROSURGICAL) ×2 IMPLANT
EVACUATOR 1/8 PVC DRAIN (DRAIN) IMPLANT
GAUZE 4X4 16PLY ~~LOC~~+RFID DBL (SPONGE) IMPLANT
GAUZE SPONGE 4X4 12PLY STRL (GAUZE/BANDAGES/DRESSINGS) ×3 IMPLANT
GLOVE SURG ENC MOIS LTX SZ6.5 (GLOVE) ×3 IMPLANT
GLOVE SURG LTX SZ9 (GLOVE) ×6 IMPLANT
GLOVE SURG UNDER POLY LF SZ6.5 (GLOVE) ×4 IMPLANT
GOWN STRL REUS W/ TWL LRG LVL3 (GOWN DISPOSABLE) IMPLANT
GOWN STRL REUS W/ TWL XL LVL3 (GOWN DISPOSABLE) ×4 IMPLANT
GOWN STRL REUS W/TWL 2XL LVL3 (GOWN DISPOSABLE) IMPLANT
GOWN STRL REUS W/TWL LRG LVL3 (GOWN DISPOSABLE) ×4
GOWN STRL REUS W/TWL XL LVL3 (GOWN DISPOSABLE) ×6
KIT BASIN OR (CUSTOM PROCEDURE TRAY) ×3 IMPLANT
KIT TURNOVER KIT B (KITS) ×3 IMPLANT
MILL MEDIUM DISP (BLADE) ×4 IMPLANT
NEEDLE HYPO 22GX1.5 SAFETY (NEEDLE) ×3 IMPLANT
NS IRRIG 1000ML POUR BTL (IV SOLUTION) ×3 IMPLANT
OIL CARTRIDGE MAESTRO DRILL (MISCELLANEOUS) ×2
PACK LAMINECTOMY NEURO (CUSTOM PROCEDURE TRAY) ×3 IMPLANT
ROD 50MM (Rod) ×2 IMPLANT
ROD RADIUS 45MM (Rod) ×2 IMPLANT
ROD SPNL 45X5.5XNS TI RDS (Rod) IMPLANT
ROD SPNL 50X5.5XNS TI RDS (Rod) IMPLANT
SCREW 5.75X45MM (Screw) ×4 IMPLANT
SPONGE SURGIFOAM ABS GEL 100 (HEMOSTASIS) ×3 IMPLANT
STRIP CLOSURE SKIN 1/2X4 (GAUZE/BANDAGES/DRESSINGS) ×6 IMPLANT
SUT VIC AB 0 CT1 18XCR BRD8 (SUTURE) ×4 IMPLANT
SUT VIC AB 0 CT1 8-18 (SUTURE) ×4
SUT VIC AB 2-0 CT1 18 (SUTURE) ×3 IMPLANT
SUT VIC AB 3-0 SH 8-18 (SUTURE) ×6 IMPLANT
TOWEL GREEN STERILE (TOWEL DISPOSABLE) ×3 IMPLANT
TOWEL GREEN STERILE FF (TOWEL DISPOSABLE) ×3 IMPLANT
TRAY FOLEY MTR SLVR 16FR STAT (SET/KITS/TRAYS/PACK) ×3 IMPLANT
WATER STERILE IRR 1000ML POUR (IV SOLUTION) ×3 IMPLANT

## 2021-08-19 NOTE — Op Note (Signed)
Date of procedure: 08/19/2021 ? ?Date of dictation: Same ? ?Service: Neurosurgery ? 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 ?Postoperative diagnosis: Same ? ?Procedure Name: Bilateral L3-4 decompressive laminotomies and foraminotomies, more than would be required for simple interbody fusion alone. ? ?L3-4 posterior lumbar interbody fusion utilizing interbody cages, local harvested autograft, and morselized allograft, ? ?L3-4 posterolateral arthrodesis utilizing nonsegmental pedicle screw fixation and local autografting. ? ?Reexploration of L4-5 S1 fusion with removal of L4 pedicle screw instrumentation ? ?Surgeon:Nakeita Styles A.Yarelli Decelles, M.D. ? ?Asst. Surgeon: Reinaldo Meeker, NP ? ?Anesthesia: General ? ?Indication: 81 year old female with severe back and bilateral lower extremity pain failing conservative management work-up demonstrates evidence of severe stenosis with degenerative anterolisthesis at L3-4 above the level of prior L4-S1 fusion.  Patient presents now for decompression and fusion surgery in hopes of improving her symptoms. ? ?Operative note: After induction of anesthesia, patient position prone on the Wilson frame and properly padded.  Lumbar region prepped and draped sterilely.  Incision made overlying L3-4 and 5.  Dissection performed bilaterally.  Retractor placed.  Fluoroscopy used.  Levels confirmed.  Previously placed pedicle screws potation at L4-5 and S1 was dissected free.  Transverse connector was removed.  The fusion was found to be solid.  The rods were cut above the L5 screws.  The L4 screws were removed.  Attention then placed to the L3-4 level.  Laminotomy was then performed using high-speed drill, Kerrison rongeurs and the and Leksell rongeurs to remove the inferior two thirds lamina of L3 the entire inferior facet of L3 bilaterally, the majority of the superior facet of L4 bilaterally  and the rudimentary remaining lamina of L4 was also removed.  Ligament flavum and epidural scar were elevated and resected.  Underlying thecal sac was identified.And it  Decompressive foraminotomies completed on the course exiting L3 and L4 nerve roots bilaterally.  Bilateral discectomy was then performed at L3-4.  The spaces then prepared for interbody fusion.  Should be noted the patient's bone quality was poor.  The discectomies violated the endplates superiorly on the left and inferiorly on the right.  The deformity was reduced however.  With distractor placed patient's right side a 9 mm [employer] cage was impacted into the left interspace and expanded.  Once again this biased into the body of L3.  Distractor was moved patient's right side.  The space.  On the right side.  Morselized autograft packed in their space.  A second cage was then impacted in the place and this cage biased into the body of L4.  Both cages were solid however and I think obtained some element of fixation.  Pedicles of L3 were identified bilaterally using surface landmarks and intraoperative fluoroscopy and each pedicle was then probed using a pedicle awl.  Pedicle was then probed and found to be solidly within the bone.  Each pedicle tract was then tapped with a screw tap.  Each screw temple was probed and found to be solidly within the bone.  5.75 mm radius brand screws were placed bilaterally at L3 and L4.  Final images reveal good position of the cages and the hardware with proper spinal alignment.  Wound is then irrigated.  Gelfoam was placed operative hemostasis.  Transverse processes were decorticated.  Morselized autograft was packed posterior laterally.  Short segment titanium rod placed over the screw heads at L3 and L4.  Locking caps placed over the screws with locking caps and engaged with  a construct under mild compression.  Vancomycin powder placed in deep wound space.  Wounds then closed in layers with Vicryl sutures.   Steri-Strips and sterile dressing were applied.  No apparent complications.  Patient tolerated the procedure well and she returns to the recovery room postop. ? ?

## 2021-08-19 NOTE — Anesthesia Preprocedure Evaluation (Signed)
Anesthesia Evaluation  ?Patient identified by MRN, date of birth, ID band ?Patient awake ? ? ? ?Reviewed: ?Allergy & Precautions, NPO status , Patient's Chart, lab work & pertinent test results ? ?History of Anesthesia Complications ?(+) PONV and history of anesthetic complications ? ?Airway ?Mallampati: II ? ?TM Distance: >3 FB ?Neck ROM: Full ? ? ? Dental ? ?(+) Dental Advisory Given ?  ?Pulmonary ?former smoker,  ?  ?breath sounds clear to auscultation ? ? ? ? ? ? Cardiovascular ?hypertension, Pt. on medications and Pt. on home beta blockers ?+ Peripheral Vascular Disease  ? ?Rhythm:Regular Rate:Normal ? ? ?  ?Neuro/Psych ?MS ? Neuromuscular disease   ? GI/Hepatic ?Neg liver ROS, GERD  ,  ?Endo/Other  ?negative endocrine ROS ? Renal/GU ?negative Renal ROS  ? ?  ?Musculoskeletal ? ?(+) Arthritis ,  ? Abdominal ?  ?Peds ? Hematology ?negative hematology ROS ?(+)   ?Anesthesia Other Findings ? ? Reproductive/Obstetrics ? ?  ? ? ? ? ? ? ? ? ? ? ? ? ? ?  ?  ? ? ? ? ? ? ? ? ?Anesthesia Physical ?Anesthesia Plan ? ?ASA: 2 ? ?Anesthesia Plan: General  ? ?Post-op Pain Management: Tylenol PO (pre-op)*  ? ?Induction:  ? ?PONV Risk Score and Plan: 4 or greater and Midazolam, Propofol infusion, Dexamethasone, Ondansetron and Treatment may vary due to age or medical condition ? ?Airway Management Planned: Oral ETT ? ?Additional Equipment: None ? ?Intra-op Plan:  ? ?Post-operative Plan: Extubation in OR ? ?Informed Consent: I have reviewed the patients History and Physical, chart, labs and discussed the procedure including the risks, benefits and alternatives for the proposed anesthesia with the patient or authorized representative who has indicated his/her understanding and acceptance.  ? ? ? ?Dental advisory given ? ?Plan Discussed with: CRNA ? ?Anesthesia Plan Comments:   ? ? ? ? ? ? ?Anesthesia Quick Evaluation ? ?

## 2021-08-19 NOTE — H&P (Signed)
?Sherri [name] is an 81 y.o. female.   ?Chief Complaint: Back pain ?HPI: 81 year old female with past history of L4-S1 decompression and fusion surgery presents with worsening back and bilateral lower extremity pain numbness and some weakness.  Work-up demonstrates evidence of adjacent level degeneration at L3-4 with degenerative spondylolisthesis and severe stenosis.  Patient has failed conservative management.  She presents now for decompression and fusion in hopes of improving her symptoms. ? ?Past Medical History:  ?Diagnosis Date  ? Anxiety   ? takes Valium daily as needed  ? Basal cell carcinoma 01/21/1988  ? left nostril (MOHS), sup-right calf (CX35FU)  ? Basal cell carcinoma 03/22/1996  ? right calf (CX35FU)  ? Basal cell carcinoma 03/31/1995  ? left wing nose  ? Bruises easily   ? d/t meds  ? Cancer [facility])   ? basal cell ca, in situ- uterine   ? Cataracts, bilateral   ? removed bilateral  ? Chronic back pain   ? Dizziness   ? r/t to meds  ? GERD (gastroesophageal reflux disease)   ? no meds on a regular basis but will take Tums if needed  ? Headache(784.0)   ? r/t neck issues  ? History of bronchitis 6-86yr ago  ? History of colon polyps   ? benign  ? Hyperlipidemia   ? takes Atorvastatin on Mondays and Fridays  ? Hypertension   ? takes Ramipril daily  ? Joint pain   ? Joint swelling   ? Multiple sclerosis ([facility]   ? Neuromuscular disorder ([facility]   ? Dr. RPark Liter [facility], follows [facility]  ? Nocturia   ? PONV (postoperative nausea and vomiting)   ? trouble urinating after surgery in 2014  ? Postoperative nausea and vomiting 01/11/2019  ? Rheumatoid arthritis ([place]   ? Dr VJani Gravelweekly, RA- hands- knees- feet   ? Rheumatoid arthritis(714.0)   ? Dr DEstanislado Pandytakes XMorrie Sheldondaily  ? Right wrist fracture   ? Skin cancer   ? Squamous cell carcinoma of skin 11/02/2001  ? in situ-right knee (cx360f  ? Squamous cell carcinoma of skin 11/12/2011  ? in situ-left forearm (CX35FU), in situ-left  foot (CX35FU)  ? Squamous cell carcinoma of skin 01/22/2012  ? in situ-left lower forearm (txpbx)  ? Squamous cell carcinoma of skin 11/17/2012  ? right shin (txpbx)  ? Squamous cell carcinoma of skin 10/28/2013  ? in situ-right shoulder (CX35FU)  ? Squamous cell carcinoma of skin 04/28/2014  ? well diff-right shin (txpbx)  ? Squamous cell carcinoma of skin 11/02/2014  ? in situ-Left shin (txpbx)  ? Squamous cell carcinoma of skin 12/15/2014  ? in situ-Left hand (txpbx), bowens-left side chest (txpbx)  ? Squamous cell carcinoma of skin 05/09/2015  ? in situ-left hand (txpbx)   ? Squamous cell carcinoma of skin 05/07/2016  ? in situ-left inner shin,ant, in situ-left inner shin, post, in situ-left outer forearm, in situ-right knuckle  ? Squamous cell carcinoma of skin 03/1302018  ? in situ-left shoulder (txpbx), in situ-right inner shin (txpbx), in situ-top of left foot (txpbx), KA- left forearm (txpbx)  ? Squamous cell carcinoma of skin 12/02/2016  ? in situ-left inner shin (txpbx)  ? Squamous cell carcinoma of skin 03/04/2017  ? in situ-left outer sup, shin (txpbx), in situ- right 2nd knuckle finger (txpbx), in situ- Left wrist (txpbx)  ? Squamous cell carcinoma of skin 04/06/2018  ? in situ-above left knee inner (txpbx)  ? Squamous cell carcinoma of skin 04/21/2018  ?  in situ-right top hand (txpbx)  ? Squamous cell carcinoma of skin 07/08/2018  ? in situ-right lower inner shin (txpbx)  ? Squamous cell carcinoma of skin 04/27/2019  ? in situ- Left neck(CX35FU), In situ- right neck (CX35FU)  ? Urinary retention   ? sees Dr.Wrenn about 2 times a yr  ? ? ?Past Surgical History:  ?Procedure Laterality Date  ? ABDOMINAL HYSTERECTOMY    ? 1985  ? APPENDECTOMY    ? with TAH  ? BACK SURGERY    ? several  ? BREAST BIOPSY Right   ? Results were negative  ? BROW LIFT Bilateral 10/02/2016  ? Procedure: BILATERAL LOWER LID BLEPHAROPLASTY;  Surgeon: Clista Bernhardt, MD;  Location: [facility];  Service: Plastics;  Laterality:  Bilateral;  ? CARPAL TUNNEL RELEASE Right   ? cataracts    ? CERVICAL FUSION    ? Dr Joya Salm  ? CERVICAL FUSION  12/31/2012  ? Dr Joya Salm  ? CHEST TUBE INSERTION    ? for traumatic Pneumothorax  ? COLONOSCOPY  07/16/2010  ? normal   ? COLONOSCOPY W/ POLYPECTOMY  1997  ? negative since; Dr Deatra Ina  ? ESOPHAGOGASTRODUODENOSCOPY  07/16/2010  ? normal  ? eye lid raise    ? EYE SURGERY Bilateral   ? cataracts removed - /w IOL  ? JOINT REPLACEMENT    ? LUMBAR FUSION    ? Dr Joya Salm  ? NASAL SINUS SURGERY    ? POSTERIOR CERVICAL FUSION/FORAMINOTOMY N/A 12/31/2012  ? Procedure: POSTERIOR LATERAL CERVICAL FUSION/FORAMINOTOMY LEVEL 1 CERVICAL THREE-FOUR WITH LATERAL MASS SCREWS;  Surgeon: Floyce Stakes, MD;  Location: [facility];  Service: Neurosurgery;  Laterality: N/A;  ? PTOSIS REPAIR Bilateral 10/02/2016  ? Procedure: INTERNAL PTOSIS REPAIR;  Surgeon: Clista Bernhardt, MD;  Location: [facility];  Service: Plastics;  Laterality: Bilateral;  ? SKIN BIOPSY    ? THYROID SURGERY    ? R lobe removed- 1972, has grown back - CT last done- 2018  ? TOTAL HIP ARTHROPLASTY  12/22/2011  ? Procedure: TOTAL HIP ARTHROPLASTY;  Surgeon: Kerin Salen, MD;  Location: [facility];  Service: Orthopedics;  Laterality: Left;  ? TOTAL SHOULDER ARTHROPLASTY    ? TUBAL LIGATION    ? UPPER GASTROINTESTINAL ENDOSCOPY  2012  ? 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  ? ? ?Family History  ?Problem Relation Age of Onset  ? Cancer Mother   ?     pancreatic  ? Diabetes Mother   ? Pancreatic cancer Mother   ? Heart disease Father   ?     Rheumatic  ? Cancer Father   ?     ? stomach  ? Stomach cancer Father   ? Cancer Sister   ?     stomach  ? Stomach cancer Sister   ? Cancer Maternal Aunt   ?     X 4; ? primary  ? Heart disease Paternal Aunt   ? Cancer Maternal Grandmother   ?     cervical  ? Colon cancer Other   ?     Aunts  ? Multiple sclerosis Daughter   ? Hypertension Neg Hx   ? Stroke Neg Hx   ? Esophageal cancer Neg Hx   ? Rectal cancer Neg Hx   ? ?Social History:   reports that she quit smoking about 40 years ago. Her smoking use included cigarettes. She has a 50.00 pack-year smoking history. She has never used smokeless tobacco. She reports  current alcohol use of about 14.0 standard drinks per week. She reports that she does not use drugs. ? ?Allergies:  ?Allergies  ?Allergen Reactions  ? Darvon [Propoxyphene Hcl] Shortness Of Breath  ?  ? Dose Related ? ?Lowered respirations greatly  ? Meperidine Hcl Shortness Of Breath  ? Penicillins Hives and Other (See Comments)  ?  Has patient had a PCN reaction causing immediate rash, facial/tongue/throat swelling, SOB or lightheadedness with hypotension: No ?SEVERE RASH INVOLVING MUCUS MEMBRANES or SKIN NECROSIS: #  #  #  YES  #  #  #  ?Has patient had a PCN reaction that required hospitalization No ?Has patient had a PCN reaction occurring within the last 10 years: No. ?  ? Azathioprine Other (See Comments)  ?  Weakness  ? Sulfa Antibiotics Other (See Comments)  ?  States had severe indigestion and heartburn and nausea and had to stop taking  ? Atorvastatin Nausea And Vomiting  ? Demerol [Meperidine]   ?  Other reaction(s): Respiratory Distress  ? Leflunomide Other (See Comments)  ?  Excessive weight gain  ? Ezetimibe-Simvastatin Nausea And Vomiting  ? ? ?Medications Prior to Admission  ?Medication Sig Dispense Refill  ? atorvastatin (LIPITOR) 10 MG tablet TAKE 1 TAB ON MONDAYS AND FRIDAY (Patient taking differently: Take 10 mg by mouth 2 (two) times a week. MONDAYS AND FRIDAY) 27 tablet 3  ? Biotin 5000 MCG TABS Take 5,000 mcg by mouth daily.    ? Calcium Carbonate-Vitamin D (CALCIUM + D PO) Take 1 tablet by mouth daily.     ? Carboxymethylcellul-Glycerin (LUBRICATING EYE DROPS OP) Place 1 drop into both eyes daily as needed (dry eyes).    ? carvedilol (COREG) 6.25 MG tablet TAKE 1 TABLET BY MOUTH 2 TIMES DAILY WITH A MEAL. (Patient taking differently: Take 6.25 mg by mouth 2 (two) times daily with a meal.) 180 tablet 1  ? diazepam  (VALIUM) 5 MG tablet Take 1 tablet (5 mg total) by mouth at bedtime. (Patient taking differently: Take 5 mg by mouth at bedtime as needed for muscle spasms.) 30 tablet 5  ? famotidine (PEPCID) 40 MG tablet Take

## 2021-08-19 NOTE — Anesthesia Procedure Notes (Addendum)
Procedure Name: Intubation ?Date/Time: 08/19/2021 8:13 AM ?Performed by: Inda Coke, CRNA ?Pre-anesthesia Checklist: Patient identified, Emergency Drugs available, Suction available and Patient being monitored ?Patient Re-evaluated:Patient Re-evaluated prior to induction ?Oxygen Delivery Method: Circle System Utilized ?Preoxygenation: Pre-oxygenation with 100% oxygen ?Induction Type: IV induction ?Ventilation: Mask ventilation without difficulty and Oral airway inserted - appropriate to patient size ?Laryngoscope Size: Glidescope and 3 ?Grade View: Grade I ?Tube type: Oral ?Tube size: 7.0 mm ?Number of attempts: 1 ?Airway Equipment and Method: Stylet and Oral airway ?Placement Confirmation: ETT inserted through vocal cords under direct vision, positive ETCO2 and breath sounds checked- equal and bilateral ?Secured at: 22 cm ?Tube secured with: Tape ?Dental Injury: Teeth and Oropharynx as per pre-operative assessment  ?Difficulty Due To: Difficulty was anticipated ?Comments: Elective glide d/t poor neck mobility. Intubated by Everlene Other SRNA ? ? ? ? ?

## 2021-08-19 NOTE — Transfer of Care (Signed)
Immediate Anesthesia Transfer of Care Note ? ?Patient: Sherri [name] ? ?Procedure(s) Performed: Posterior Lumbar Interbody Fusion- Lumabe three-Lumabr four - removal of old screws (Spine Lumbar) ? ?Patient Location: PACU ? ?Anesthesia Type:General ? ?Level of Consciousness: awake and alert  ? ?Airway & Oxygen Therapy: Patient Spontanous Breathing and Patient connected to nasal cannula oxygen ? ?Post-op Assessment: Report given to RN and Post -op Vital signs reviewed and stable ? ?Post vital signs: Reviewed and stable ? ?Last Vitals:  ?Vitals Value Taken Time  ?BP 120/69 08/19/21 1034  ?Temp    ?Pulse 55 08/19/21 1037  ?Resp 11 08/19/21 1037  ?SpO2 98 % 08/19/21 1037  ?Vitals shown include unvalidated device data. ? ?Last Pain:  ?Vitals:  ? 08/19/21 0644  ?TempSrc:   ?PainSc: 4   ?   ? ?Patients Stated Pain Goal: 2 (08/19/21 8867) ? ?Complications: No notable events documented. ?

## 2021-08-19 NOTE — Progress Notes (Signed)
Orthopedic Tech Progress Note ?Patient Details:  ?Sherri [name] ?02/13/1941 ?[id number] ? ?RN stated "patient has back  brace"  ? ?Patient ID: Sherri [name], female   DOB: 07/27/1940, 81 y.o.   MRN: [id number] ? ?Sherri [name] ?08/19/2021, 12:49 PM ? ?

## 2021-08-19 NOTE — Brief Op Note (Signed)
08/19/2021 ? ?10:18 AM ? ?PATIENT:  Sherri [name]  81 y.o. female ? ?PRE-OPERATIVE DIAGNOSIS:  Spondylolisthesis ? ?POST-OPERATIVE DIAGNOSIS:  Spondylolisthesis ? ?PROCEDURE:  Procedure(s): ?Posterior Lumbar Interbody Fusion- Lumabe three-Lumabr four - removal of old screws (N/A) ? ?SURGEON:  Surgeon(s) and Role: ?   Earnie Larsson, MD - Primary ? ?PHYSICIAN ASSISTANT:  ? ?ASSISTANTS: Bergman,NP  ? ?ANESTHESIA:   general ? ?EBL:  100 mL  ? ?BLOOD ADMINISTERED:none ? ?DRAINS: none  ? ?LOCAL MEDICATIONS USED:  MARCAINE    ? ?SPECIMEN:  No Specimen ? ?DISPOSITION OF SPECIMEN:  N/A ? ?COUNTS:  YES ? ?TOURNIQUET:  * No tourniquets in log * ? ?DICTATION: .Dragon Dictation ? ?PLAN OF CARE: Admit for overnight observation ? ?PATIENT DISPOSITION:  PACU - hemodynamically stable. ?  ?Delay start of Pharmacological VTE agent (>24hrs) due to surgical blood loss or risk of bleeding: yes ? ?

## 2021-08-20 MED ORDER — CYCLOBENZAPRINE HCL 10 MG PO TABS: 10.0000 mg | ORAL_TABLET | Freq: Three times a day (TID) | ORAL | 0 refills | Status: DC | PRN

## 2021-08-20 MED ORDER — HYDROCODONE-ACETAMINOPHEN 10-325 MG PO TABS: 1.0000 | ORAL_TABLET | ORAL | 0 refills | Status: DC | PRN

## 2021-08-20 NOTE — Evaluation (Signed)
Physical Therapy Evaluation and Discharge ?Patient Details ?Name: Sherri [name] ?MRN: [id number] ?DOB: 06/09/1940 ?Today's Date: 08/20/2021 ? ?History of Present Illness ? Pt is a 81 y/o female who presents s/p bil L3-4 decompresssive laminotomies and foraminotomies L3-4 PLIF on 08/19/2021. PMH anxiety, basal cell carcinoma, HTN, MS, neuromuscular disorder, RA R wrist fracture, back surg , cervical fusion 2014, L THA total. total shoulder arthoroplasty, ?  ?Clinical Impression ? Patient evaluated by Physical Therapy with no further acute PT needs identified. All education has been completed and the patient has no further questions. Pt was able to demonstrate transfers and ambulation with gross modified independence and occasional supervision for safety. Pt was educated on precautions, brace application/wearing schedule, appropriate activity progression, and car transfer. See below for any follow-up Physical Therapy or equipment needs. PT is signing off. Thank you for this referral.    ?   ? ?Recommendations for follow up therapy are one component of a multi-disciplinary discharge planning process, led by the attending physician.  Recommendations may be updated based on patient status, additional functional criteria and insurance authorization. ? ?Follow Up Recommendations No PT follow up ? ?  ?Assistance Recommended at Discharge PRN  ?Patient can return home with the following ? A little help with walking and/or transfers;Assistance with cooking/housework;Assist for transportation;Help with stairs or ramp for entrance ? ?  ?Equipment Recommendations None recommended by PT  ?Recommendations for Other Services ?    ?  ?Functional Status Assessment Patient has had a recent decline in their functional status and demonstrates the ability to make significant improvements in function in a reasonable and predictable amount of time.  ? ?  ?Precautions / Restrictions Precautions ?Precautions: Back;Fall ?Precaution Booklet  Issued: Yes (comment) ?Precaution Comments: Reviewed handout and pt was cued for precautions during functional mobility. ?Required Braces or Orthoses: Spinal Brace ?Spinal Brace: Lumbar corset;Applied in sitting position ?Restrictions ?Weight Bearing Restrictions: No  ? ?  ? ?Mobility ? Bed Mobility ?Overal bed mobility: Modified Independent ?Bed Mobility: Rolling, Sidelying to Sit ?  ?  ?  ?  ?  ?General bed mobility comments: No assist required. HOB slightly elevated and rails lowered to simulate home environment. ?  ? ?Transfers ?Overall transfer level: Modified independent ?Equipment used: Rolling walker (2 wheels) ?  ?  ?  ?  ?  ?  ?  ?General transfer comment: No assist required. Pt was able to power up to full stand with proper hand placement on seated surface for safety. ?  ? ?Ambulation/Gait ?Ambulation/Gait assistance: Supervision ?Gait Distance (Feet): 450 Feet ?Assistive device: Rolling walker (2 wheels) ?Gait Pattern/deviations: Step-through pattern, Decreased stride length, Trunk flexed ?Gait velocity: Decreased ?Gait velocity interpretation: 1.31 - 2.62 ft/sec, indicative of limited community ambulator ?  ?General Gait Details: Pt demonstrating good posture throughout gait training. No assist required and no gross unsteadiness noted. ? ?Stairs ?Stairs: Yes ?Stairs assistance: Min guard ?Stair Management: Step to pattern, Forwards, One rail Right ?Number of Stairs: 4 ?General stair comments: VC's for sequencing and general safety. No assist required. ? ?Wheelchair Mobility ?  ? ?Modified Rankin (Stroke Patients Only) ?  ? ?  ? ?Balance Overall balance assessment: Mild deficits observed, not formally tested ?  ?  ?  ?  ?  ?  ?  ?  ?  ?  ?  ?  ?  ?  ?  ?  ?  ?  ?   ? ? ? ?Pertinent Vitals/Pain Pain Assessment ?Pain Assessment: 0-10 ?Pain  Score: 5  ?Pain Location: back ?Pain Descriptors / Indicators: Operative site guarding, Sore ?Pain Intervention(s): Limited activity within patient's tolerance,  Monitored during session, Repositioned  ? ? ?Home Living Family/patient expects to be discharged to:: Private residence ?Living Arrangements: Spouse/significant other (hired CNA that was met at hospital 08/19/21) ?Available Help at Discharge: Family;Available 24 hours/day ?Type of Home: House ?Home Access: Stairs to enter;Ramped entrance ?Entrance Stairs-Rails: Right ?Entrance Stairs-Number of Steps: 4 ?Alternate Level Stairs-Number of Steps: flight ?Home Layout: Two level;Able to live on main level with bedroom/bathroom ?Home Equipment: BSC/3in1;Tub bench;Grab bars - toilet;Rollator (4 wheels) (x3 lift chair in the home) ?Additional Comments: lives with spouse that falls frequently and pt has to call help with life alert button. pt has a cat named Buford Dresser that is grey/ white in color  ?  ?Prior Function Prior Level of Function : Independent/Modified Independent ?  ?  ?  ?  ?  ?  ?Mobility Comments: furniture walks ?ADLs Comments: indep ?  ? ? ?Hand Dominance  ? Dominant Hand: Right ? ?  ?Extremity/Trunk Assessment  ? Upper Extremity Assessment ?Upper Extremity Assessment: Defer to OT evaluation ?  ? ?Lower Extremity Assessment ?Lower Extremity Assessment: Generalized weakness (Consistent with pre-op diagnosis) ?  ? ?Cervical / Trunk Assessment ?Cervical / Trunk Assessment: Back Surgery  ?Communication  ? Communication: No difficulties  ?Cognition Arousal/Alertness: Awake/alert ?Behavior During Therapy: [facility] for tasks assessed/performed ?Overall Cognitive Status: Within Functional Limits for tasks assessed ?  ?  ?  ?  ?  ?  ?  ?  ?  ?  ?  ?  ?  ?  ?  ?  ?  ?  ?  ? ?  ?General Comments   ? ?  ?Exercises    ? ?Assessment/Plan  ?  ?PT Assessment Patient does not need any further PT services  ?PT Problem List   ? ?   ?  ?PT Treatment Interventions     ? ?PT Goals (Current goals can be found in the Care Plan section)  ?Acute Rehab PT Goals ?Patient Stated Goal: Home today ?PT Goal Formulation: All assessment and education  complete, DC therapy ? ?  ?Frequency   ?  ? ? ?Co-evaluation   ?  ?  ?  ?  ? ? ?  ?AM-PAC PT "6 Clicks" Mobility  ?Outcome Measure Help needed turning from your back to your side while in a flat bed without using bedrails?: None ?Help needed moving from lying on your back to sitting on the side of a flat bed without using bedrails?: None ?Help needed moving to and from a bed to a chair (including a wheelchair)?: None ?Help needed standing up from a chair using your arms (e.g., wheelchair or bedside chair)?: None ?Help needed to walk in hospital room?: None ?Help needed climbing 3-5 steps with a railing? : A Little ?6 Click Score: 23 ? ?  ?End of Session Equipment Utilized During Treatment: Back brace ?Activity Tolerance: Patient tolerated treatment well ?Patient left: in chair;with call bell/phone within reach;with family/visitor present ?Nurse Communication: Mobility status ?PT Visit Diagnosis: Unsteadiness on feet (R26.81);Pain ?Pain - part of body:  (back) ?  ? ?Time: 0932-6712 ?PT Time Calculation (min) (ACUTE ONLY): 24 min ? ? ?Charges:   PT Evaluation ?$PT Eval Low Complexity: 1 Low ?PT Treatments ?$Gait Training: 8-22 mins ?  ?   ? ? ?Rolinda Roan, PT, DPT ?Acute Rehabilitation Services ?Pager: [phone] ?Office: [phone]  ? ?Sherri [name] ?08/20/2021,  12:39 PM ? ?

## 2021-08-20 NOTE — Anesthesia Postprocedure Evaluation (Signed)
Anesthesia Post Note ? ?Patient: Sherri [name] ? ?Procedure(s) Performed: Posterior Lumbar Interbody Fusion- Lumabe three-Lumabr four - removal of old screws (Spine Lumbar) ? ?  ? ?Patient location during evaluation: PACU ?Anesthesia Type: General ?Level of consciousness: awake and alert ?Pain management: pain level controlled ?Vital Signs Assessment: post-procedure vital signs reviewed and stable ?Respiratory status: spontaneous breathing, nonlabored ventilation, respiratory function stable and patient connected to nasal cannula oxygen ?Cardiovascular status: blood pressure returned to baseline and stable ?Postop Assessment: no apparent nausea or vomiting ?Anesthetic complications: no ? ? ?No notable events documented. ? ?Last Vitals:  ?Vitals:  ? 08/20/21 0407 08/20/21 0809  ?BP: 128/63 104/64  ?Pulse: 70 68  ?Resp: 18 16  ?Temp: 36.6 ?C 36.7 ?C  ?SpO2: 97% 98%  ?  ?Last Pain:  ?Vitals:  ? 08/20/21 1019  ?TempSrc:   ?PainSc: 3   ? ? ?  ?  ?  ?  ?  ?  ? ?Suzette Battiest E ? ? ? ? ?

## 2021-08-20 NOTE — Evaluation (Signed)
Occupational Therapy Evaluation ?Patient Details ?Name: Sherri [name] ?MRN: [id number] ?DOB: 03/03/1941 ?Today's Date: 08/20/2021 ? ? ?History of Present Illness 81 yo female bil L3-4 decompresssive laminotomies and foraminotomies L3-4 PLIF utilizing interbody cages local harvest autograft and nonsegmental pedicle screw fixation PMH anxiety, basal cell carcinoma, HTN, MS, neuromuscular disorder, RA R wrist fracture, back surg , cervical fusion 2014, L THA total. total shoulder arthoroplasty,  ? ?Clinical Impression ?  ?Patient evaluated by Occupational Therapy with no further acute OT needs identified. All education has been completed and the patient has no further questions. See below for any follow-up Occupational Therapy or equipment needs. OT to sign off. Thank you for referral.  ?  ?   ? ?Recommendations for follow up therapy are one component of a multi-disciplinary discharge planning process, led by the attending physician.  Recommendations may be updated based on patient status, additional functional criteria and insurance authorization.  ? ?Follow Up Recommendations ? No OT follow up  ?  ?Assistance Recommended at Discharge Set up Supervision/Assistance  ?Patient can return home with the following A little help with bathing/dressing/bathroom;Assistance with cooking/housework;Assistance with feeding;Direct supervision/assist for medications management;Direct supervision/assist for financial management;Assist for transportation ? ?  ?Functional Status Assessment ? Patient has had a recent decline in their functional status and demonstrates the ability to make significant improvements in function in a reasonable and predictable amount of time.  ?Equipment Recommendations ?    ?  ?Recommendations for Other Services   ? ? ?  ?Precautions / Restrictions Precautions ?Precautions: Back ?Precaution Booklet Issued: Yes (comment) ?Required Braces or Orthoses: Spinal Brace ?Spinal Brace: Lumbar corset;Applied in  sitting position  ? ?  ? ?Mobility Bed Mobility ?  ?  ?  ?  ?  ?  ?  ?General bed mobility comments: oob on arrival ?  ? ?Transfers ?Overall transfer level: Modified independent ?  ?  ?  ?  ?  ?  ?  ?  ?General transfer comment: able to complete and power up with bil UE used on arm rest ?  ? ?  ?Balance Overall balance assessment: Mild deficits observed, not formally tested ?  ?  ?  ?  ?  ?  ?  ?  ?  ?  ?  ?  ?  ?  ?  ?  ?  ?  ?   ? ?ADL either performed or assessed with clinical judgement  ? ?ADL Overall ADL's : Needs assistance/impaired ?Eating/Feeding: Set up ?  ?Grooming: Set up ?  ?Upper Body Bathing: Set up ?  ?Lower Body Bathing: Set up ?Lower Body Bathing Details (indicate cue type and reason): able to figure 4 cross recommend R then L ?Upper Body Dressing : Set up ?  ?Lower Body Dressing: Set up ?Lower Body Dressing Details (indicate cue type and reason): able to don doff brace indep in standing. able to figure 4 cross and needs to dress R before L ?Toilet Transfer: Supervision/safety ?  ?  ?  ?  ?  ?  ?General ADL Comments: CNA in room [place] to help with education recall and inform family. CNA taking a few notes during session. OT adding information to the back handout. Recommendation for new reacher as pt states her's is very old  ?Back handout provided and reviewed adls in detail. Pt educated on: clothing between brace, never sleep in brace, set an alarm at night for medication, avoid sitting for long periods of time, correct bed positioning for sleeping,  correct sequence for bed mobility, avoiding lifting more than 5 pounds and never wash directly over incision. All education is complete and patient indicates understanding. ? ? ? ?Vision Baseline Vision/History: 0 No visual deficits ?   ?   ?Perception   ?  ?Praxis   ?  ? ?Pertinent Vitals/Pain Pain Assessment ?Pain Assessment: Faces ?Faces Pain Scale: Hurts little more ?Pain Location: back surgerical ?Pain Descriptors / Indicators: Operative site  guarding ?Pain Intervention(s): Monitored during session, Patient requesting pain meds-RN notified, RN gave pain meds during session  ? ? ? ?Hand Dominance Right ?  ?Extremity/Trunk Assessment Upper Extremity Assessment ?Upper Extremity Assessment: Overall WFL for tasks assessed ?  ?Lower Extremity Assessment ?Lower Extremity Assessment: Defer to PT evaluation ?  ?Cervical / Trunk Assessment ?Cervical / Trunk Assessment: Back Surgery ?  ?Communication Communication ?Communication: No difficulties ?  ?Cognition Arousal/Alertness: Awake/alert ?Behavior During Therapy: [facility] for tasks assessed/performed ?Overall Cognitive Status: Within Functional Limits for tasks assessed ?  ?  ?  ?  ?  ?  ?  ?  ?  ?  ?  ?  ?  ?  ?  ?  ?  ?  ?  ?General Comments  back incision dry and covered dressing. CNA shown to wash around dressing and keep dry with dressing ? ?  ?Exercises   ?  ?Shoulder Instructions    ? ? ?Home Living Family/patient expects to be discharged to:: Private residence ?Living Arrangements: Spouse/significant other (hired CNA that was met at hospital 08/19/21) ?Available Help at Discharge: Family;Available 24 hours/day ?Type of Home: House ?Home Access: Stairs to enter;Ramped entrance ?  ?  ?Home Layout: One level ?  ?  ?Bathroom Shower/Tub: Walk-in shower (tub) ?  ?Bathroom Toilet: Handicapped height ?  ?  ?Home Equipment: BSC/3in1;Tub bench;Grab bars - toilet;Rollator (4 wheels) (x3 lift chair in the home) ?  ?Additional Comments: lives with spouse that falls frequenctly and pt has to call help with line alert button. pt has a cat named Buford Dresser that is grey/ white in color ?  ? ?  ?Prior Functioning/Environment Prior Level of Function : Independent/Modified Independent ?  ?  ?  ?  ?  ?  ?Mobility Comments: furniture walks ?ADLs Comments: indep ?  ? ?  ?  ?OT Problem List: Impaired balance (sitting and/or standing);Decreased activity tolerance ?  ?   ?OT Treatment/Interventions:    ?  ?OT Goals(Current goals can be  found in the care plan section) Acute Rehab OT Goals ?Patient Stated Goal: to return home  ?OT Frequency:   ?  ? ?Co-evaluation   ?  ?  ?  ?  ? ?  ?AM-PAC OT "6 Clicks" Daily Activity     ?Outcome Measure Help from another person eating meals?: None ?Help from another person taking care of personal grooming?: A Little ?Help from another person toileting, which includes using toliet, bedpan, or urinal?: A Little ?Help from another person bathing (including washing, rinsing, drying)?: A Little ?Help from another person to put on and taking off regular upper body clothing?: A Little ?Help from another person to put on and taking off regular lower body clothing?: A Little ?6 Click Score: 19 ?  ?End of Session Equipment Utilized During Treatment: Back brace ?Nurse Communication: Mobility status;Precautions ? ?Activity Tolerance: Patient tolerated treatment well ?Patient left: in chair;with call bell/phone within reach;with family/visitor present ? ?OT Visit Diagnosis: Unsteadiness on feet (R26.81);Muscle weakness (generalized) (M62.81)  ?              ?  Time: 0211-1735 ?OT Time Calculation (min): 23 min ?Charges:  OT General Charges ?$OT Visit: 1 Visit ?OT Evaluation ?$OT Eval Moderate Complexity: 1 Mod ? ? ?Brynn, OTR/L  ?Acute Rehabilitation Services ?Pager: [phone] ?Office: [phone] ?. ? ? ?Jeri Modena ?08/20/2021, 11:19 AM ?

## 2021-08-20 NOTE — Progress Notes (Signed)
Patient alert and oriented, mae's well, voiding adequate amount of urine, swallowing without difficulty, no c/o pain at time of discharge. Patient discharged home with caretaker. Script and discharged instructions given to patient. Patient and caretaker stated understanding of instructions given. Patient has an appointment with Dr. Annette Stable in 3 weeks ?

## 2021-08-20 NOTE — Discharge Instructions (Addendum)
Wound Care Keep incision covered and dry for three days.  Do not put any creams, lotions, or ointments on incision. Leave steri-strips on back.  They will fall off by themselves. Activity Walk each and every day, increasing distance each day. No lifting greater than 5 lbs.  Avoid excessive neck motion. No driving for 2 weeks; may ride as a passenger locally. If provided with back brace, wear when out of bed.  It is not necessary to wear brace in bed. Diet Resume your normal diet.   Call Your Doctor If Any of These Occur Redness, drainage, or swelling at the wound.  Temperature greater than 101 degrees. Severe pain not relieved by pain medication. Incision starts to come apart. Follow Up Appt Call today for appointment in 1-2 weeks ([phone]) or for problems.  If you have any hardware placed in your spine, you will need an x-ray before your appointment.  

## 2021-08-20 NOTE — Discharge Summary (Signed)
Physician Discharge Summary  ?Patient ID: ?Sherri [name] ?MRN: [id number] ?DOB/AGE: 1941/02/07 81 y.o. ? ?Admit date: 08/19/2021 ?Discharge date: 08/20/2021 ? ?Admission Diagnoses: ? ?Discharge Diagnoses:  ?Principal Problem: ?  Degenerative spondylolisthesis ? ? ?Discharged Condition: good ? ?Hospital Course: Patient mated to the hospital where she underwent uncomplicated D5-3 decompression and fusion surgery.  Postoperatively doing well.  Preoperative back and lower extremity pain much improved.  Standing ambulating and voiding without difficulty.  Patient ready for discharge home. ? ?Consults:  ? ?Significant Diagnostic Studies:  ? ?Treatments:  ? ?Discharge Exam: ?Blood pressure 104/64, pulse 68, temperature 98.1 ?F (36.7 ?C), temperature source Oral, resp. rate 16, height '5\' 1"'$  (1.549 m), weight 53.5 kg, SpO2 98 %. ?Awake and alert.  Oriented and appropriate.  Motor and sensory function intact.  Wound clean and dry.  Chest and abdomen benign. ? ?Disposition: Discharge disposition: 01-Home or Self Care ? ? ? ? ? ? ? ?Allergies as of 08/20/2021   ? ?   Reactions  ? Darvon [propoxyphene Hcl] Shortness Of Breath  ? ? Dose Related ? ?Lowered respirations greatly  ? Meperidine Hcl Shortness Of Breath  ? Penicillins Hives, Other (See Comments)  ? Has patient had a PCN reaction causing immediate rash, facial/tongue/throat swelling, SOB or lightheadedness with hypotension: No ?SEVERE RASH INVOLVING MUCUS MEMBRANES or SKIN NECROSIS: #  #  #  YES  #  #  #  ?Has patient had a PCN reaction that required hospitalization No ?Has patient had a PCN reaction occurring within the last 10 years: No.  ? Azathioprine Other (See Comments)  ? Weakness  ? Sulfa Antibiotics Other (See Comments)  ? States had severe indigestion and heartburn and nausea and had to stop taking  ? Atorvastatin Nausea And Vomiting  ? Demerol [meperidine]   ? Other reaction(s): Respiratory Distress  ? Leflunomide Other (See Comments)  ? Excessive weight gain   ? Ezetimibe-simvastatin Nausea And Vomiting  ? ?  ? ?  ?Medication List  ?  ? ?TAKE these medications   ? ?atorvastatin 10 MG tablet ?Commonly known as: LIPITOR ?TAKE 1 TAB ON MONDAYS AND FRIDAY ?What changed: See the new instructions. ?  ?Biotin 5000 MCG Tabs ?Take 5,000 mcg by mouth daily. ?  ?CALCIUM + D PO ?Take 1 tablet by mouth daily. ?  ?carvedilol 6.25 MG tablet ?Commonly known as: COREG ?TAKE 1 TABLET BY MOUTH 2 TIMES DAILY WITH A MEAL. ?  ?cyclobenzaprine 10 MG tablet ?Commonly known as: FLEXERIL ?Take 1 tablet (10 mg total) by mouth 3 (three) times daily as needed for muscle spasms. ?  ?diazepam 5 MG tablet ?Commonly known as: VALIUM ?Take 1 tablet (5 mg total) by mouth at bedtime. ?What changed:  ?when to take this ?reasons to take this ?  ?diclofenac sodium 1 % Gel ?Commonly known as: VOLTAREN ?APPLY 3 GRAMS TO 3 LARGE JOINTS 3 TIMES A DAY AS NEEDED ?What changed: See the new instructions. ?  ?famotidine 40 MG tablet ?Commonly known as: PEPCID ?Take 1 tablet (40 mg total) by mouth 2 (two) times daily. ?  ?folic acid 1 MG tablet ?Commonly known as: FOLVITE ?Take 2 tablets (2 mg total) by mouth daily. ?  ?furosemide 20 MG tablet ?Commonly known as: LASIX ?TAKE 1 TABLET BY MOUTH EVERY DAY ?  ?HYDROcodone-acetaminophen 10-325 MG tablet ?Commonly known as: NORCO ?Take 1 tablet by mouth every 4 (four) hours as needed for moderate pain ((score 4 to 6)). ?  ?LUBRICATING EYE DROPS OP ?  Place 1 drop into both eyes daily as needed (dry eyes). ?  ?methotrexate 50 MG/2ML injection ?INJECT 0.6 ML INTO THE SKIN ONCE WEEKLY. ?What changed:  ?how to take this ?when to take this ?additional instructions ?  ?predniSONE 5 MG tablet ?Commonly known as: DELTASONE ?TAKE 1 TABLET BY MOUTH EVERY DAY WITH BREAKFAST ?What changed: See the new instructions. ?  ?Prolia 60 MG/ML Sosy injection ?Generic drug: denosumab ?Inject 60 mg into the skin every 6 (six) months. Deliver to clinic: [address], [place], [place], [place]  [id number]. Appt on 07/09/21 ?  ?psyllium 58.6 % powder ?Commonly known as: METAMUCIL ?Take 1 packet by mouth daily as needed (constipation). ?  ?[name] 15 MG Tb24 ?Generic drug: Upadacitinib ER ?TAKE 1 TABLET (15 MG) BY MOUTH DAILY. ?What changed:  ?how much to take ?how to take this ?when to take this ?  ?traMADol 50 MG tablet ?Commonly known as: ULTRAM ?Take 1 tablet (50 mg total) by mouth 3 (three) times daily as needed for severe pain. ?  ?TUBERCULIN SYR 1CC/27GX1/2" 27G X 1/2" 1 ML Misc ?Use 1 syringe once weekly to inject methotrexate. ?  ?Vitamin D3 50 MCG (2000 UT) capsule ?Take 2,000 Units by mouth daily. ?  ? ?  ? ?  ?  ? ? ?  ?Durable Medical Equipment  ?(From admission, onward)  ?  ? ? ?  ? ?  Start     Ordered  ? 08/19/21 1150  DME Walker rolling  Once       ?Question:  Patient needs a walker to treat with the following condition  Answer:  Degenerative spondylolisthesis  ? 08/19/21 1149  ? 08/19/21 1150  DME 3 n 1  Once       ? 08/19/21 1149  ? ?  ?  ? ?  ? ? ? ?Signed: ?Cooper Render Asti Mackley ?08/20/2021, 8:19 AM ? ? ?

## 2021-08-21 MED FILL — Heparin Sodium (Porcine) Inj 1000 Unit/ML: INTRAMUSCULAR | Qty: 30 | Status: AC

## 2021-08-21 MED FILL — Sodium Chloride IV Soln 0.9%: INTRAVENOUS | Qty: 1000 | Status: AC

## 2021-08-24 HISTORY — PX: HARDWARE REMOVAL: SHX979

## 2021-09-06 ENCOUNTER — Telehealth: Payer: Self-pay

## 2021-09-06 ENCOUNTER — Ambulatory Visit: Payer: Medicare Other

## 2021-09-06 NOTE — Telephone Encounter (Signed)
Called patient for [place], patient states she still recovering from surgery and request to reschedule . ? ? ?L.Jaythen Hamme,LPN ?

## 2021-09-06 NOTE — Progress Notes (Signed)
error 

## 2021-09-09 ENCOUNTER — Other Ambulatory Visit: Payer: Self-pay | Admitting: Internal Medicine

## 2021-09-09 ENCOUNTER — Other Ambulatory Visit: Payer: Self-pay | Admitting: Family Medicine

## 2021-09-12 ENCOUNTER — Other Ambulatory Visit (HOSPITAL_COMMUNITY): Payer: Self-pay | Admitting: Student

## 2021-09-12 ENCOUNTER — Ambulatory Visit (HOSPITAL_BASED_OUTPATIENT_CLINIC_OR_DEPARTMENT_OTHER)
Admission: RE | Admit: 2021-09-12 | Discharge: 2021-09-12 | Disposition: A | Discharge status: Home / Self Care | Payer: Medicare Other | Source: Ambulatory Visit | Attending: Student | Admitting: Student

## 2021-09-12 ENCOUNTER — Other Ambulatory Visit: Payer: Self-pay | Admitting: Student

## 2021-09-12 DIAGNOSIS — S32049A Unspecified fracture of fourth lumbar vertebra, initial encounter for closed fracture: Secondary | ICD-10-CM

## 2021-09-12 DIAGNOSIS — M4310 Spondylolisthesis, site unspecified: Secondary | ICD-10-CM | POA: Diagnosis not present

## 2021-09-16 ENCOUNTER — Other Ambulatory Visit: Payer: Self-pay | Admitting: Physician Assistant

## 2021-09-16 ENCOUNTER — Telehealth: Payer: Self-pay | Admitting: Rheumatology

## 2021-09-16 NOTE — Telephone Encounter (Signed)
Patient advised she may come by the office to pick up samples of [name]. Samples reserved in the cabinet for patient.  ?

## 2021-09-16 NOTE — Telephone Encounter (Signed)
Patient called the office requesting to pick up samples of [name]. Patient states she would like to pick up tomorrow if possible. Patient would also like Dr. Estanislado Pandy to review her chart due to a due a back. Patient states it should be in her chart. ?

## 2021-09-17 ENCOUNTER — Other Ambulatory Visit: Payer: Self-pay | Admitting: Neurosurgery

## 2021-09-17 NOTE — Telephone Encounter (Signed)
Spoke with patient and she has not received clearance to restart the [name]. Patient states she sees her surgeon today and will talk to him about that and will have them send a clearance letter. Patient advised to not pick up samples until she has been cleared.  ?

## 2021-09-19 ENCOUNTER — Encounter (HOSPITAL_COMMUNITY): Payer: Self-pay | Admitting: Neurosurgery

## 2021-09-19 NOTE — Progress Notes (Signed)
TWO VISITORS ARE ALLOWED TO COME WITH YOU AND STAY IN THE SURGICAL WAITING ROOM ONLY DURING PRE OP AND PROCEDURE DAY OF SURGERY.  ? ?Two VISITORS MAY VISIT WITH YOU AFTER SURGERY IN YOUR PRIVATE ROOM DURING VISITING HOURS ONLY! ? ?PCP - Dr Billey Gosling ?Cardiologist - n/a ?Rheumatology - Dr Jaynie Collins ? ?Chest x-ray - n/a ?EKG - 08/15/21 ?Stress Test - 02/25/01 ?ECHO - 04/20/17 ?Cardiac Cath - n/a ? ?ICD Pacemaker/Loop - n/a ? ?Sleep Study -  n/a ?CPAP - none ? ?Anesthesia review: Yes ? ?STOP now taking any Aspirin (unless otherwise instructed by your surgeon), Aleve, Naproxen, Ibuprofen, Motrin, Advil, Goody's, BC's, all herbal medications, fish oil, and all vitamins.  ? ?Coronavirus Screening ?Do you have any of the following symptoms:  ?Cough yes/no: No ?Fever (>100.13F)  yes/no: No ?Runny nose yes/no: No ?Sore throat yes/no: No ?Difficulty breathing/shortness of breath  yes/no: No ? ?Have you traveled in the last 14 days and where? yes/no: No ? ?Patient verbalized understanding of instructions that were given via phone. ?

## 2021-09-20 ENCOUNTER — Other Ambulatory Visit: Payer: Self-pay

## 2021-09-20 ENCOUNTER — Encounter (HOSPITAL_COMMUNITY)
Admission: RE | Disposition: A | Discharge status: Home Health | Payer: Self-pay | Source: Home / Self Care | Attending: Neurosurgery

## 2021-09-20 ENCOUNTER — Inpatient Hospital Stay (HOSPITAL_COMMUNITY): Payer: Medicare Other | Admitting: Physician Assistant

## 2021-09-20 ENCOUNTER — Inpatient Hospital Stay (HOSPITAL_COMMUNITY)
Admission: RE | Admit: 2021-09-20 | Discharge: 2021-09-21 | DRG: 458 | Disposition: A | Discharge status: Home Health | Payer: Medicare Other | Attending: Neurosurgery | Admitting: Neurosurgery

## 2021-09-20 ENCOUNTER — Inpatient Hospital Stay (HOSPITAL_COMMUNITY): Payer: Medicare Other

## 2021-09-20 ENCOUNTER — Encounter (HOSPITAL_COMMUNITY): Payer: Self-pay | Admitting: Neurosurgery

## 2021-09-20 DIAGNOSIS — Z8719 Personal history of other diseases of the digestive system: Secondary | ICD-10-CM | POA: Diagnosis not present

## 2021-09-20 DIAGNOSIS — Z833 Family history of diabetes mellitus: Secondary | ICD-10-CM | POA: Diagnosis not present

## 2021-09-20 DIAGNOSIS — I739 Peripheral vascular disease, unspecified: Secondary | ICD-10-CM | POA: Diagnosis not present

## 2021-09-20 DIAGNOSIS — M8088XA Other osteoporosis with current pathological fracture, vertebra(e), initial encounter for fracture: Secondary | ICD-10-CM | POA: Diagnosis not present

## 2021-09-20 DIAGNOSIS — M069 Rheumatoid arthritis, unspecified: Secondary | ICD-10-CM | POA: Diagnosis not present

## 2021-09-20 DIAGNOSIS — Z87891 Personal history of nicotine dependence: Secondary | ICD-10-CM

## 2021-09-20 DIAGNOSIS — Z820 Family history of epilepsy and other diseases of the nervous system: Secondary | ICD-10-CM | POA: Diagnosis not present

## 2021-09-20 DIAGNOSIS — Z880 Allergy status to penicillin: Secondary | ICD-10-CM

## 2021-09-20 DIAGNOSIS — M5416 Radiculopathy, lumbar region: Secondary | ICD-10-CM | POA: Diagnosis present

## 2021-09-20 DIAGNOSIS — E785 Hyperlipidemia, unspecified: Secondary | ICD-10-CM | POA: Diagnosis not present

## 2021-09-20 DIAGNOSIS — Z800 Family history of malignant neoplasm of digestive organs: Secondary | ICD-10-CM

## 2021-09-20 DIAGNOSIS — G35 Multiple sclerosis: Secondary | ICD-10-CM | POA: Diagnosis not present

## 2021-09-20 DIAGNOSIS — Z888 Allergy status to other drugs, medicaments and biological substances status: Secondary | ICD-10-CM | POA: Diagnosis not present

## 2021-09-20 DIAGNOSIS — M8008XA Age-related osteoporosis with current pathological fracture, vertebra(e), initial encounter for fracture: Principal | ICD-10-CM | POA: Diagnosis present

## 2021-09-20 DIAGNOSIS — M8448XA Pathological fracture, other site, initial encounter for fracture: Secondary | ICD-10-CM | POA: Diagnosis present

## 2021-09-20 DIAGNOSIS — R339 Retention of urine, unspecified: Secondary | ICD-10-CM | POA: Diagnosis present

## 2021-09-20 DIAGNOSIS — Z974 Presence of external hearing-aid: Secondary | ICD-10-CM

## 2021-09-20 DIAGNOSIS — M48061 Spinal stenosis, lumbar region without neurogenic claudication: Secondary | ICD-10-CM | POA: Diagnosis present

## 2021-09-20 DIAGNOSIS — K219 Gastro-esophageal reflux disease without esophagitis: Secondary | ICD-10-CM | POA: Diagnosis present

## 2021-09-20 DIAGNOSIS — Z8249 Family history of ischemic heart disease and other diseases of the circulatory system: Secondary | ICD-10-CM | POA: Diagnosis not present

## 2021-09-20 DIAGNOSIS — Z96642 Presence of left artificial hip joint: Secondary | ICD-10-CM | POA: Diagnosis present

## 2021-09-20 DIAGNOSIS — F419 Anxiety disorder, unspecified: Secondary | ICD-10-CM | POA: Diagnosis present

## 2021-09-20 DIAGNOSIS — Z981 Arthrodesis status: Secondary | ICD-10-CM

## 2021-09-20 DIAGNOSIS — Z79899 Other long term (current) drug therapy: Secondary | ICD-10-CM | POA: Diagnosis not present

## 2021-09-20 DIAGNOSIS — Z85828 Personal history of other malignant neoplasm of skin: Secondary | ICD-10-CM

## 2021-09-20 DIAGNOSIS — S32009A Unspecified fracture of unspecified lumbar vertebra, initial encounter for closed fracture: Secondary | ICD-10-CM | POA: Diagnosis not present

## 2021-09-20 DIAGNOSIS — I10 Essential (primary) hypertension: Secondary | ICD-10-CM | POA: Diagnosis present

## 2021-09-20 DIAGNOSIS — M4326 Fusion of spine, lumbar region: Secondary | ICD-10-CM | POA: Diagnosis not present

## 2021-09-20 DIAGNOSIS — R519 Headache, unspecified: Secondary | ICD-10-CM | POA: Diagnosis present

## 2021-09-20 DIAGNOSIS — T84226A Displacement of internal fixation device of vertebrae, initial encounter: Secondary | ICD-10-CM | POA: Diagnosis not present

## 2021-09-20 HISTORY — DX: Unspecified hearing loss, unspecified ear: H91.90

## 2021-09-20 LAB — TYPE AND SCREEN
ABO/RH(D): A POS
Antibody Screen: NEGATIVE

## 2021-09-20 LAB — CBC
HCT: 40.6 % (ref 36.0–46.0)
Hemoglobin: 13.6 g/dL (ref 12.0–15.0)
MCH: 33.7 pg (ref 26.0–34.0)
MCHC: 33.5 g/dL (ref 30.0–36.0)
MCV: 100.7 fL — ABNORMAL HIGH (ref 80.0–100.0)
Platelets: 331 10*3/uL (ref 150–400)
RBC: 4.03 MIL/uL (ref 3.87–5.11)
RDW: 15.9 % — ABNORMAL HIGH (ref 11.5–15.5)
WBC: 5 10*3/uL (ref 4.0–10.5)
nRBC: 0 % (ref 0.0–0.2)

## 2021-09-20 LAB — BASIC METABOLIC PANEL
Anion gap: 7 (ref 5–15)
BUN: 6 mg/dL — ABNORMAL LOW (ref 8–23)
CO2: 28 mmol/L (ref 22–32)
Calcium: 8.8 mg/dL — ABNORMAL LOW (ref 8.9–10.3)
Chloride: 102 mmol/L (ref 98–111)
Creatinine, Ser: 0.72 mg/dL (ref 0.44–1.00)
GFR, Estimated: >60 mL/min (ref >60)
Glucose, Bld: 108 mg/dL — ABNORMAL HIGH (ref 70–99)
Potassium: 3.6 mmol/L (ref 3.5–5.1)
Sodium: 137 mmol/L (ref 135–145)

## 2021-09-20 LAB — SURGICAL PCR SCREEN
MRSA, PCR: NEGATIVE
Staphylococcus aureus: NEGATIVE

## 2021-09-20 SURGERY — POSTERIOR LUMBAR FUSION 1 LEVEL
Anesthesia: General | Site: Back

## 2021-09-20 MED ORDER — BIOTIN 5000 MCG PO TABS: 10000.0000 ug | ORAL_TABLET | Freq: Every evening | ORAL | Status: DC

## 2021-09-20 MED ORDER — ONDANSETRON HCL 4 MG/2ML IJ SOLN
INTRAMUSCULAR | Status: AC
  Filled 2021-09-20: qty 2

## 2021-09-20 MED ORDER — DEXAMETHASONE SODIUM PHOSPHATE 10 MG/ML IJ SOLN
INTRAMUSCULAR | Status: DC | PRN
  Administered 2021-09-20: 5 mg via INTRAVENOUS

## 2021-09-20 MED ORDER — VANCOMYCIN HCL 1000 MG IV SOLR
INTRAVENOUS | Status: DC | PRN
  Administered 2021-09-20: 1000 mg via TOPICAL

## 2021-09-20 MED ORDER — METHOCARBAMOL 500 MG PO TABS
500.0000 mg | ORAL_TABLET | Freq: Four times a day (QID) | ORAL | Status: DC | PRN
  Administered 2021-09-20 – 2021-09-21 (×2): 500 mg via ORAL
  Filled 2021-09-20 (×2): qty 1

## 2021-09-20 MED ORDER — OXYCODONE HCL 5 MG PO TABS
ORAL_TABLET | ORAL | Status: AC
  Filled 2021-09-20: qty 1

## 2021-09-20 MED ORDER — POLYETHYLENE GLYCOL 3350 17 G PO PACK: 17.0000 g | PACK | ORAL | Status: DC

## 2021-09-20 MED ORDER — POLYETHYLENE GLYCOL 3350 17 G PO PACK: 17.0000 g | PACK | Freq: Every day | ORAL | Status: DC | PRN

## 2021-09-20 MED ORDER — ONDANSETRON HCL 4 MG/2ML IJ SOLN: 4.0000 mg | Freq: Once | INTRAMUSCULAR | Status: DC | PRN

## 2021-09-20 MED ORDER — PROPOFOL 10 MG/ML IV BOLUS
INTRAVENOUS | Status: AC
  Filled 2021-09-20: qty 20

## 2021-09-20 MED ORDER — BUPIVACAINE HCL (PF) 0.25 % IJ SOLN
INTRAMUSCULAR | Status: DC | PRN
  Administered 2021-09-20: 30 mL

## 2021-09-20 MED ORDER — SODIUM CHLORIDE 0.9% FLUSH: 3.0000 mL | Freq: Two times a day (BID) | INTRAVENOUS | Status: DC

## 2021-09-20 MED ORDER — FENTANYL CITRATE (PF) 250 MCG/5ML IJ SOLN
INTRAMUSCULAR | Status: AC
  Filled 2021-09-20: qty 5

## 2021-09-20 MED ORDER — HYDROCODONE-ACETAMINOPHEN 10-325 MG PO TABS: 1.0000 | ORAL_TABLET | ORAL | Status: DC | PRN

## 2021-09-20 MED ORDER — CARVEDILOL 6.25 MG PO TABS: 6.2500 mg | ORAL_TABLET | Freq: Two times a day (BID) | ORAL | Status: DC

## 2021-09-20 MED ORDER — ACETAMINOPHEN 650 MG RE SUPP: 650.0000 mg | RECTAL | Status: DC | PRN

## 2021-09-20 MED ORDER — FENTANYL CITRATE (PF) 100 MCG/2ML IJ SOLN: 100.0000 ug | Freq: Once | INTRAMUSCULAR | Status: AC

## 2021-09-20 MED ORDER — HYDROMORPHONE HCL 1 MG/ML IJ SOLN
0.2500 mg | INTRAMUSCULAR | Status: DC | PRN
  Administered 2021-09-20: 0.25 mg via INTRAVENOUS
  Administered 2021-09-20: 0.5 mg via INTRAVENOUS
  Administered 2021-09-20: 0.25 mg via INTRAVENOUS
  Administered 2021-09-20: 0.5 mg via INTRAVENOUS

## 2021-09-20 MED ORDER — 0.9 % SODIUM CHLORIDE (POUR BTL) OPTIME
TOPICAL | Status: DC | PRN
  Administered 2021-09-20: 1000 mL

## 2021-09-20 MED ORDER — ROCURONIUM BROMIDE 10 MG/ML (PF) SYRINGE
PREFILLED_SYRINGE | INTRAVENOUS | Status: DC | PRN
  Administered 2021-09-20: 30 mg via INTRAVENOUS
  Administered 2021-09-20: 60 mg via INTRAVENOUS

## 2021-09-20 MED ORDER — CHLORHEXIDINE GLUCONATE 0.12 % MT SOLN: 15.0000 mL | Freq: Once | OROMUCOSAL | Status: AC

## 2021-09-20 MED ORDER — HYDROMORPHONE HCL 1 MG/ML IJ SOLN
INTRAMUSCULAR | Status: AC
  Filled 2021-09-20: qty 1

## 2021-09-20 MED ORDER — ONDANSETRON HCL 4 MG/2ML IJ SOLN
INTRAMUSCULAR | Status: DC | PRN
  Administered 2021-09-20: 4 mg via INTRAVENOUS

## 2021-09-20 MED ORDER — PHENYLEPHRINE 80 MCG/ML (10ML) SYRINGE FOR IV PUSH (FOR BLOOD PRESSURE SUPPORT)
PREFILLED_SYRINGE | INTRAVENOUS | Status: DC | PRN
  Administered 2021-09-20 (×3): 80 ug via INTRAVENOUS

## 2021-09-20 MED ORDER — VANCOMYCIN HCL IN DEXTROSE 1-5 GM/200ML-% IV SOLN
1000.0000 mg | INTRAVENOUS | Status: AC
  Administered 2021-09-20: 1000 mg via INTRAVENOUS
  Filled 2021-09-20: qty 200

## 2021-09-20 MED ORDER — FAMOTIDINE 20 MG PO TABS
10.0000 mg | ORAL_TABLET | Freq: Every morning | ORAL | Status: DC
  Administered 2021-09-21: 10 mg via ORAL
  Filled 2021-09-20: qty 1

## 2021-09-20 MED ORDER — FLEET ENEMA 7-19 GM/118ML RE ENEM: 1.0000 | ENEMA | Freq: Once | RECTAL | Status: DC | PRN

## 2021-09-20 MED ORDER — THROMBIN 20000 UNITS EX SOLR
CUTANEOUS | Status: AC
  Filled 2021-09-20: qty 20000

## 2021-09-20 MED ORDER — METHOCARBAMOL 1000 MG/10ML IJ SOLN
500.0000 mg | Freq: Four times a day (QID) | INTRAVENOUS | Status: DC | PRN
  Filled 2021-09-20: qty 5

## 2021-09-20 MED ORDER — ORAL CARE MOUTH RINSE: 15.0000 mL | Freq: Once | OROMUCOSAL | Status: AC

## 2021-09-20 MED ORDER — CHLORHEXIDINE GLUCONATE CLOTH 2 % EX PADS: 6.0000 | MEDICATED_PAD | Freq: Once | CUTANEOUS | Status: DC

## 2021-09-20 MED ORDER — SODIUM CHLORIDE 0.9% FLUSH: 3.0000 mL | INTRAVENOUS | Status: DC | PRN

## 2021-09-20 MED ORDER — ONDANSETRON HCL 4 MG/2ML IJ SOLN: 4.0000 mg | Freq: Four times a day (QID) | INTRAMUSCULAR | Status: DC | PRN

## 2021-09-20 MED ORDER — PHENYLEPHRINE HCL-NACL 20-0.9 MG/250ML-% IV SOLN
INTRAVENOUS | Status: DC | PRN
  Administered 2021-09-20: 50 ug/min via INTRAVENOUS

## 2021-09-20 MED ORDER — ONDANSETRON HCL 4 MG PO TABS: 4.0000 mg | ORAL_TABLET | Freq: Four times a day (QID) | ORAL | Status: DC | PRN

## 2021-09-20 MED ORDER — MENTHOL 3 MG MT LOZG: 1.0000 | LOZENGE | OROMUCOSAL | Status: DC | PRN

## 2021-09-20 MED ORDER — FENTANYL CITRATE (PF) 250 MCG/5ML IJ SOLN
INTRAMUSCULAR | Status: DC | PRN
Start: 2021-09-20 — End: 2021-09-20
  Administered 2021-09-20: 100 ug via INTRAVENOUS
  Administered 2021-09-20: 50 ug via INTRAVENOUS
  Administered 2021-09-20 (×2): 100 ug via INTRAVENOUS

## 2021-09-20 MED ORDER — CHLORHEXIDINE GLUCONATE 0.12 % MT SOLN
OROMUCOSAL | Status: AC
  Administered 2021-09-20: 15 mL via OROMUCOSAL
  Filled 2021-09-20: qty 15

## 2021-09-20 MED ORDER — OXYCODONE HCL 5 MG/5ML PO SOLN: 5.0000 mg | Freq: Once | ORAL | Status: AC | PRN

## 2021-09-20 MED ORDER — HYDROMORPHONE HCL 1 MG/ML IJ SOLN: 1.0000 mg | INTRAMUSCULAR | Status: DC | PRN

## 2021-09-20 MED ORDER — DEXAMETHASONE SODIUM PHOSPHATE 10 MG/ML IJ SOLN
INTRAMUSCULAR | Status: AC
  Filled 2021-09-20: qty 1

## 2021-09-20 MED ORDER — BUPIVACAINE HCL (PF) 0.25 % IJ SOLN
INTRAMUSCULAR | Status: AC
  Filled 2021-09-20: qty 30

## 2021-09-20 MED ORDER — PROPOFOL 10 MG/ML IV BOLUS
INTRAVENOUS | Status: DC | PRN
  Administered 2021-09-20: 100 mg via INTRAVENOUS

## 2021-09-20 MED ORDER — OXYCODONE HCL 5 MG PO TABS
10.0000 mg | ORAL_TABLET | ORAL | Status: DC | PRN
  Administered 2021-09-20 – 2021-09-21 (×3): 10 mg via ORAL
  Filled 2021-09-20 (×3): qty 2

## 2021-09-20 MED ORDER — OXYCODONE HCL 5 MG PO TABS
5.0000 mg | ORAL_TABLET | Freq: Once | ORAL | Status: AC | PRN
  Administered 2021-09-20: 5 mg via ORAL

## 2021-09-20 MED ORDER — PHENOL 1.4 % MT LIQD: 1.0000 | OROMUCOSAL | Status: DC | PRN

## 2021-09-20 MED ORDER — ACETAMINOPHEN 325 MG PO TABS: 650.0000 mg | ORAL_TABLET | ORAL | Status: DC | PRN

## 2021-09-20 MED ORDER — PHENYLEPHRINE 80 MCG/ML (10ML) SYRINGE FOR IV PUSH (FOR BLOOD PRESSURE SUPPORT)
PREFILLED_SYRINGE | INTRAVENOUS | Status: AC
  Filled 2021-09-20: qty 10

## 2021-09-20 MED ORDER — THROMBIN 20000 UNITS EX SOLR
CUTANEOUS | Status: DC | PRN
  Administered 2021-09-20: 20 mL via TOPICAL

## 2021-09-20 MED ORDER — VANCOMYCIN HCL 1000 MG IV SOLR
INTRAVENOUS | Status: AC
  Filled 2021-09-20: qty 20

## 2021-09-20 MED ORDER — SUGAMMADEX SODIUM 200 MG/2ML IV SOLN
INTRAVENOUS | Status: DC | PRN
Start: 2021-09-20 — End: 2021-09-20
  Administered 2021-09-20: 150 mg via INTRAVENOUS

## 2021-09-20 MED ORDER — PREDNISONE 5 MG PO TABS
5.0000 mg | ORAL_TABLET | Freq: Every day | ORAL | Status: DC
  Filled 2021-09-20: qty 1

## 2021-09-20 MED ORDER — LIDOCAINE 2% (20 MG/ML) 5 ML SYRINGE
INTRAMUSCULAR | Status: DC | PRN
  Administered 2021-09-20: 60 mg via INTRAVENOUS

## 2021-09-20 MED ORDER — FOLIC ACID 1 MG PO TABS: 2.0000 mg | ORAL_TABLET | Freq: Every day | ORAL | Status: DC

## 2021-09-20 MED ORDER — BISACODYL 10 MG RE SUPP: 10.0000 mg | Freq: Every day | RECTAL | Status: DC | PRN

## 2021-09-20 MED ORDER — LACTATED RINGERS IV SOLN: INTRAVENOUS | Status: DC

## 2021-09-20 MED ORDER — FENTANYL CITRATE (PF) 100 MCG/2ML IJ SOLN
INTRAMUSCULAR | Status: AC
  Administered 2021-09-20: 50 ug
  Filled 2021-09-20: qty 2

## 2021-09-20 MED ORDER — SODIUM CHLORIDE 0.9 % IV SOLN: 250.0000 mL | INTRAVENOUS | Status: DC

## 2021-09-20 MED ORDER — VITAMIN D3 25 MCG (1000 UNIT) PO TABS: 1000.0000 [IU] | ORAL_TABLET | Freq: Every morning | ORAL | Status: DC

## 2021-09-20 MED ORDER — FUROSEMIDE 20 MG PO TABS: 20.0000 mg | ORAL_TABLET | Freq: Every day | ORAL | Status: DC

## 2021-09-20 SURGICAL SUPPLY — 68 items
ADH SKN CLS APL DERMABOND .7 (GAUZE/BANDAGES/DRESSINGS) ×1
APL SKNCLS STERI-STRIP NONHPOA (GAUZE/BANDAGES/DRESSINGS) ×1
BAG COUNTER SPONGE SURGICOUNT (BAG) ×4 IMPLANT
BAG DECANTER FOR FLEXI CONT (MISCELLANEOUS) ×2 IMPLANT
BAG SPNG CNTER NS LX DISP (BAG) ×2
BENZOIN TINCTURE PRP APPL 2/3 (GAUZE/BANDAGES/DRESSINGS) ×3 IMPLANT
BLADE CLIPPER SURG (BLADE) IMPLANT
BOWL CEMENT MIX W SPATULA BONE (MISCELLANEOUS) ×1 IMPLANT
BUR CUTTER 7.0 ROUND (BURR) IMPLANT
BUR MATCHSTICK NEURO 3.0 LAGG (BURR) ×3 IMPLANT
CANISTER SUCT 3000ML PPV (MISCELLANEOUS) ×3 IMPLANT
CARTRIDGE OIL MAESTRO DRILL (MISCELLANEOUS) ×2 IMPLANT
CNTNR URN SCR LID CUP LEK RST (MISCELLANEOUS) ×2 IMPLANT
CONT SPEC 4OZ STRL OR WHT (MISCELLANEOUS) ×2
COVER BACK TABLE 60X90IN (DRAPES) ×5 IMPLANT
DECANTER SPIKE VIAL GLASS SM (MISCELLANEOUS) ×2 IMPLANT
DERMABOND ADVANCED (GAUZE/BANDAGES/DRESSINGS) ×1
DERMABOND ADVANCED .7 DNX12 (GAUZE/BANDAGES/DRESSINGS) ×2 IMPLANT
DIFFUSER DRILL AIR PNEUMATIC (MISCELLANEOUS) ×3 IMPLANT
DRAPE C-ARM 42X72 X-RAY (DRAPES) ×6 IMPLANT
DRAPE HALF SHEET 40X57 (DRAPES) ×2 IMPLANT
DRAPE LAPAROTOMY 100X72X124 (DRAPES) ×3 IMPLANT
DRAPE SURG 17X23 STRL (DRAPES) ×12 IMPLANT
DRSG OPSITE POSTOP 4X6 (GAUZE/BANDAGES/DRESSINGS) ×3 IMPLANT
DRSG OPSITE POSTOP 4X8 (GAUZE/BANDAGES/DRESSINGS) ×1 IMPLANT
DURAPREP 26ML APPLICATOR (WOUND CARE) ×3 IMPLANT
ELECT REM PT RETURN 9FT ADLT (ELECTROSURGICAL) ×2
ELECTRODE REM PT RTRN 9FT ADLT (ELECTROSURGICAL) ×2 IMPLANT
EVACUATOR 1/8 PVC DRAIN (DRAIN) IMPLANT
GAUZE 4X4 16PLY ~~LOC~~+RFID DBL (SPONGE) IMPLANT
GAUZE SPONGE 4X4 12PLY STRL (GAUZE/BANDAGES/DRESSINGS) IMPLANT
GLOVE BIO SURGEON STRL SZ 6.5 (GLOVE) ×3 IMPLANT
GLOVE BIOGEL PI IND STRL 6.5 (GLOVE) ×2 IMPLANT
GLOVE BIOGEL PI IND STRL 7.5 (GLOVE) IMPLANT
GLOVE BIOGEL PI INDICATOR 6.5 (GLOVE) ×1
GLOVE BIOGEL PI INDICATOR 7.5 (GLOVE) ×3
GLOVE ECLIPSE 6.5 STRL STRAW (GLOVE) ×1 IMPLANT
GLOVE ECLIPSE 7.0 STRL STRAW (GLOVE) ×3 IMPLANT
GLOVE ECLIPSE 9.0 STRL (GLOVE) ×6 IMPLANT
GLOVE EXAM NITRILE XL STR (GLOVE) IMPLANT
GOWN STRL REUS W/ TWL LRG LVL3 (GOWN DISPOSABLE) IMPLANT
GOWN STRL REUS W/ TWL XL LVL3 (GOWN DISPOSABLE) ×4 IMPLANT
GOWN STRL REUS W/TWL 2XL LVL3 (GOWN DISPOSABLE) IMPLANT
GOWN STRL REUS W/TWL LRG LVL3 (GOWN DISPOSABLE)
GOWN STRL REUS W/TWL XL LVL3 (GOWN DISPOSABLE) ×4
KIT BASIN OR (CUSTOM PROCEDURE TRAY) ×3 IMPLANT
KIT TURNOVER KIT B (KITS) ×3 IMPLANT
MILL MEDIUM DISP (BLADE) ×2 IMPLANT
NEEDLE HYPO 22GX1.5 SAFETY (NEEDLE) ×3 IMPLANT
NS IRRIG 1000ML POUR BTL (IV SOLUTION) ×3 IMPLANT
OIL CARTRIDGE MAESTRO DRILL (MISCELLANEOUS) ×2
PACK LAMINECTOMY NEURO (CUSTOM PROCEDURE TRAY) ×3 IMPLANT
PENCIL BUTTON HOLSTER BLD 10FT (ELECTRODE) ×1 IMPLANT
ROD PREBENT 6.35X70 (Rod) ×2 IMPLANT
SCREW DANEK NONBREAK (Screw) ×6 IMPLANT
SCREW PEDICLE VA L635 6.5X45M (Screw) ×2 IMPLANT
SCREW SET BREAK OFF (Screw) ×2 IMPLANT
SPONGE SURGIFOAM ABS GEL 100 (HEMOSTASIS) ×3 IMPLANT
STRIP CLOSURE SKIN 1/2X4 (GAUZE/BANDAGES/DRESSINGS) ×6 IMPLANT
SUT ETHILON 2 0 PSLX (SUTURE) ×2 IMPLANT
SUT VIC AB 0 CT1 18XCR BRD8 (SUTURE) ×4 IMPLANT
SUT VIC AB 0 CT1 8-18 (SUTURE) ×4
SUT VIC AB 2-0 CT1 18 (SUTURE) ×3 IMPLANT
SUT VIC AB 3-0 SH 8-18 (SUTURE) ×6 IMPLANT
TOWEL GREEN STERILE (TOWEL DISPOSABLE) ×3 IMPLANT
TOWEL GREEN STERILE FF (TOWEL DISPOSABLE) ×3 IMPLANT
TRAY FOLEY MTR SLVR 16FR STAT (SET/KITS/TRAYS/PACK) ×3 IMPLANT
WATER STERILE IRR 1000ML POUR (IV SOLUTION) ×3 IMPLANT

## 2021-09-20 NOTE — Anesthesia Procedure Notes (Signed)
Procedure Name: Intubation ?Date/Time: 09/20/2021 3:24 PM ?Performed by: Valda Favia, CRNA ?Pre-anesthesia Checklist: Patient identified, Emergency Drugs available, Suction available and Patient being monitored ?Patient Re-evaluated:Patient Re-evaluated prior to induction ?Oxygen Delivery Method: Circle System Utilized ?Preoxygenation: Pre-oxygenation with 100% oxygen ?Induction Type: IV induction ?Ventilation: Mask ventilation without difficulty ?Laryngoscope Size: Mac and 4 ?Grade View: Grade I ?Tube type: Oral ?Number of attempts: 1 ?Airway Equipment and Method: Stylet ?Placement Confirmation: ETT inserted through vocal cords under direct vision, positive ETCO2 and breath sounds checked- equal and bilateral ?Secured at: 20 cm ?Tube secured with: Tape ?Dental Injury: Teeth and Oropharynx as per pre-operative assessment  ? ? ? ? ?

## 2021-09-20 NOTE — Progress Notes (Deleted)
Office Visit Note  Patient: Sherri [name]             Date of Birth: 24-Jun-1940           MRN: [id number]             PCP: Binnie Rail, MD Referring: Binnie Rail, MD Visit Date: 10/03/2021 Occupation: '@GUAROCC'$ @  Subjective:  No chief complaint on file.   History of Present Illness: Sherri [name] is a 81 y.o. female ***   09/20/21 L3-S1 fusion revision performed by Dr. Annette Stable  DEXA: 01/16/2020 T-score: -4.2. Hx of frequent fractures.  Most recent fracture: Nondisplaced right rib fracture noted on x-rays from 04/22/2021.   Previous Prolia doses: 05/04/18, 11/12/18, 06/09/19, 12/07/19, 07/05/20, 01/01/2021, 07/09/21  She remains on Prolia 60 mg subcutaneous injections every 6 months.    Activities of Daily Living:  Patient reports morning stiffness for *** {minute/hour:19697}.   Patient {ACTIONS;DENIES/REPORTS:21021675::"Denies"} nocturnal pain.  Difficulty dressing/grooming: {ACTIONS;DENIES/REPORTS:21021675::"Denies"} Difficulty climbing stairs: {ACTIONS;DENIES/REPORTS:21021675::"Denies"} Difficulty getting out of chair: {ACTIONS;DENIES/REPORTS:21021675::"Denies"} Difficulty using hands for taps, buttons, cutlery, and/or writing: {ACTIONS;DENIES/REPORTS:21021675::"Denies"}  No Rheumatology ROS completed.   PMFS History:  Patient Active Problem List   Diagnosis Date Noted   Degenerative spondylolisthesis 08/19/2021   Acute left lumbar radiculopathy 07/05/2021   Closed fracture of rib of right side with delayed healing 05/29/2021   Senile purpura ([place]) 12/19/2020   Restless leg 02/02/2020   Bilateral leg edema 01/11/2020   Cervical radiculopathy 06/28/2019   Carpal tunnel syndrome on both sides 06/28/2019   Right sided sciatica 04/14/2018   Trochanteric bursitis of right hip 04/14/2018   S/P cervical spinal fusion 11/10/2017   Dysphagia 10/26/2017   Gastroesophageal reflux disease 08/06/2017   Easy bruising 08/06/2017   Mid back pain 03/26/2017   Submandibular gland  swelling 01/08/2017   High risk medication use 09/02/2016   Piriformis muscle pain 09/02/2016   Right hip pain 04/08/2016   Right knee pain 02/25/2016   Numbness 11/14/2014   Biceps tendon tear 10/10/2014   Insomnia 10/10/2014   Gait disturbance 06/13/2014   OP (osteoporosis) 01/18/2014   Other long term (current) drug therapy 01/18/2014   Hoarseness 12/29/2013   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 01/01/2013   Personal history of colonic polyps 07/03/2010   Nocturia 05/28/2010   THYROID NODULE 05/17/2008   Multiple sclerosis ([place]) 05/17/2008   Other fatigue 03/30/2007   Hyperlipidemia 08/20/2006   Essential hypertension 08/20/2006   Rheumatoid arthritis ([place]) 08/20/2006   CERVICAL CANCER, HX OF 08/20/2006   GASTRIC ULCER, HX OF 08/20/2006    Past Medical History:  Diagnosis Date   Anxiety    takes Valium daily as needed   Basal cell carcinoma 01/21/1988   left nostril (MOHS), sup-right calf (CX35FU)   Basal cell carcinoma 03/22/1996   right calf (CX35FU)   Basal cell carcinoma 03/31/1995   left wing nose   Bruises easily    d/t meds   Cancer ([facility])    basal cell ca, in situ- uterine    Cataracts, bilateral    removed bilateral   Chronic back pain    Dizziness    r/t to meds   GERD (gastroesophageal reflux disease)    no meds on a regular basis but will take Tums if needed   Headache(784.0)    r/t neck issues   History of bronchitis 6-6yr ago   History of colon polyps    benign   HOH (hard of hearing)    wears  hearing aids   Hyperlipidemia    takes Atorvastatin on Mondays and Fridays   Hypertension    Joint pain    Joint swelling    Multiple sclerosis ([place])    Neuromuscular disorder ([place])    Dr. Park Liter- [facility], follows M.S.   Nocturia    PONV (postoperative nausea and vomiting)    trouble urinating after surgery in 2014   Postoperative nausea and vomiting 01/11/2019   Rheumatoid arthritis ([place])    Dr Jani Gravel weekly, RA-  hands- knees- feet    Rheumatoid arthritis(714.0)    Dr Estanislado Pandy takes [name] daily   Right wrist fracture    Skin cancer    Squamous cell carcinoma of skin 11/02/2001   in situ-right knee (cx74f)   Squamous cell carcinoma of skin 11/12/2011   in situ-left forearm (CX35FU), in situ-left foot (CX35FU)   Squamous cell carcinoma of skin 01/22/2012   in situ-left lower forearm (txpbx)   Squamous cell carcinoma of skin 11/17/2012   right shin (txpbx)   Squamous cell carcinoma of skin 10/28/2013   in situ-right shoulder (CX35FU)   Squamous cell carcinoma of skin 04/28/2014   well diff-right shin (txpbx)   Squamous cell carcinoma of skin 11/02/2014   in situ-Left shin (txpbx)   Squamous cell carcinoma of skin 12/15/2014   in situ-Left hand (txpbx), bowens-left side chest (txpbx)   Squamous cell carcinoma of skin 05/09/2015   in situ-left hand (txpbx)    Squamous cell carcinoma of skin 05/07/2016   in situ-left inner shin,ant, in situ-left inner shin, post, in situ-left outer forearm, in situ-right knuckle   Squamous cell carcinoma of skin 03/1302018   in situ-left shoulder (txpbx), in situ-right inner shin (txpbx), in situ-top of left foot (txpbx), KA- left forearm (txpbx)   Squamous cell carcinoma of skin 12/02/2016   in situ-left inner shin (txpbx)   Squamous cell carcinoma of skin 03/04/2017   in situ-left outer sup, shin (txpbx), in situ- right 2nd knuckle finger (txpbx), in situ- Left wrist (txpbx)   Squamous cell carcinoma of skin 04/06/2018   in situ-above left knee inner (txpbx)   Squamous cell carcinoma of skin 04/21/2018   in situ-right top hand (txpbx)   Squamous cell carcinoma of skin 07/08/2018   in situ-right lower inner shin (txpbx)   Squamous cell carcinoma of skin 04/27/2019   in situ- Left neck(CX35FU), In situ- right neck (CX35FU)   Urinary retention    sees Dr.Wrenn about 2 times a yr    Family History  Problem Relation Age of Onset   Cancer Mother         pancreatic   Diabetes Mother    Pancreatic cancer Mother    Heart disease Father        Rheumatic   Cancer Father        ? stomach   Stomach cancer Father    Cancer Sister        stomach   Stomach cancer Sister    Cancer Maternal Aunt        X 4; ? primary   Heart disease Paternal Aunt    Cancer Maternal Grandmother        cervical   Colon cancer Other        Aunts   Multiple sclerosis Daughter    Hypertension Neg Hx    Stroke Neg Hx    Esophageal cancer Neg Hx    Rectal cancer Neg Hx    Past Surgical History:  Procedure Laterality Date   ABDOMINAL HYSTERECTOMY     1985   APPENDECTOMY     with TAH   BACK SURGERY     several   BREAST BIOPSY Right    Results were negative   BROW LIFT Bilateral 10/02/2016   Procedure: BILATERAL LOWER LID BLEPHAROPLASTY;  Surgeon: Clista Bernhardt, MD;  Location: [facility];  Service: Plastics;  Laterality: Bilateral;   CARPAL TUNNEL RELEASE Right    cataracts     CERVICAL FUSION     Dr Joya Salm   CERVICAL FUSION  12/31/2012   Dr Joya Salm   CHEST TUBE INSERTION     for traumatic Pneumothorax   COLONOSCOPY  07/16/2010   normal    COLONOSCOPY W/ POLYPECTOMY  1997   negative since; Dr Deatra Ina   ESOPHAGOGASTRODUODENOSCOPY  07/16/2010   normal   eye lid raise     EYE SURGERY Bilateral    cataracts removed - /w IOL   JOINT REPLACEMENT     LUMBAR FUSION     Dr Joya Salm   NASAL SINUS SURGERY     POSTERIOR CERVICAL FUSION/FORAMINOTOMY N/A 12/31/2012   Procedure: POSTERIOR LATERAL CERVICAL FUSION/FORAMINOTOMY LEVEL 1 CERVICAL THREE-FOUR WITH LATERAL MASS SCREWS;  Surgeon: Floyce Stakes, MD;  Location: [facility];  Service: Neurosurgery;  Laterality: N/A;   PTOSIS REPAIR Bilateral 10/02/2016   Procedure: INTERNAL PTOSIS REPAIR;  Surgeon: Clista Bernhardt, MD;  Location: [facility];  Service: Plastics;  Laterality: Bilateral;   SKIN BIOPSY     THYROID SURGERY     R lobe removed- 1972, has grown back - CT last done- 2018   TOTAL HIP ARTHROPLASTY   12/22/2011   Procedure: TOTAL HIP ARTHROPLASTY;  Surgeon: Kerin Salen, MD;  Location: [facility];  Service: Orthopedics;  Laterality: Left;   TOTAL SHOULDER ARTHROPLASTY     TUBAL LIGATION     UPPER GASTROINTESTINAL ENDOSCOPY  2012   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   Social History   Social History Narrative   Not on file   Immunization History  Administered Date(s) Administered   Fluad Quad(high Dose 65+) 02/02/2020   Influenza Whole 02/22/2007, 02/08/2013   Influenza, High Dose Seasonal PF 02/04/2015, 02/18/2017, 02/21/2018, 01/16/2019, 01/25/2021   Influenza-Unspecified 01/24/2014, 01/25/2016, 02/18/2017, 01/16/2019, 01/25/2020   PFIZER(Purple Top)SARS-COV-2 Vaccination 06/18/2019, 07/09/2019, 01/09/2020, 07/03/2020, 02/07/2021   Pfizer Covid-19 Vaccine Bivalent Booster 2yr & up 02/08/2021   Pneumococcal Conjugate-13 11/23/2012, 09/08/2014   Pneumococcal Polysaccharide-23 05/26/2006   Pneumococcal-Unspecified 12/28/2012, 06/09/2020   Td 05/28/2010   Tdap 12/24/2020, 01/07/2021   Unspecified SARS-COV-2 Vaccination 06/26/2020   Varicella 11/07/2020   Zoster Recombinat (Shingrix) 02/10/2019, 04/14/2019   Zoster, Live 05/26/2009, 07/10/2020     Objective: Vital Signs: There were no vitals taken for this visit.   Physical Exam   Musculoskeletal Exam: ***  CDAI Exam: CDAI Score: -- Patient Global: --; Provider Global: -- Swollen: --; Tender: -- Joint Exam 10/03/2021   No joint exam has been documented for this visit   There is currently no information documented on the homunculus. Go to the Rheumatology activity and complete the homunculus joint exam.  Investigation: No additional findings.  Imaging: CT LUMBAR SPINE WO CONTRAST  Result Date: 09/12/2021 CLINICAL DATA:  Recent fall with L4 fracture, recent surgery EXAM: CT LUMBAR SPINE WITHOUT CONTRAST TECHNIQUE: Multidetector CT imaging of the lumbar spine was performed without intravenous contrast administration.  Multiplanar CT image reconstructions were also generated. RADIATION DOSE REDUCTION: This exam was performed  according to the departmental dose-optimization program which includes automated exposure control, adjustment of the mA and/or kV according to patient size and/or use of iterative reconstruction technique. COMPARISON:  None. FINDINGS: Segmentation: 5 lumbar type vertebrae. Alignment: Approximately 6 mm anterolisthesis at L3-L4. Vertebrae: Osseous demineralization. Postoperative changes are present with rods and pedicle screws at L3-L4. There is subsidence of a left-sided interbody spacer into L3. Subsidence right sided interbody spacer into fractured L4 vertebral body. Rods and pedicle screws are also present at L5-S1. Interbody spacers are present at L4-L5 and L5-S1. Laminectomies are present at the operative levels. Streak artifact associated with the hardware. Paraspinal and other soft tissues: Extensive atherosclerosis. Disc levels: L1-L2: Partially calcified disc bulge. Facet hypertrophy with ligamentum flavum thickening. Mild to moderate canal stenosis. Mild to moderate foraminal stenosis. L2-L3: Partially calcified disc bulge. Facet hypertrophy with ligamentum flavum thickening. Moderate canal stenosis. Mild foraminal stenosis. L3-L4: Operative level. Anterolisthesis. Endplate osteophytes and endplate retropulsion. Facet hypertrophy. The canal is not well evaluated due to artifact. Suspect persistent left foraminal narrowing. L4-L5: Operative level. The canal is not well evaluated due to artifact. There is probable bilateral foraminal narrowing secondary to distortion from compression fracture. L5-S1: Operative level. Canal is not well evaluated due to artifact. Endplate osteophytes are present. No significant foraminal narrowing. IMPRESSION: Known L4 fracture. Postoperative changes as described with intact hardware. There is subsidence of the L3-L4 interbody spacers into L3 and L4. Canal is not well  evaluated at the fracture and operative levels due to artifact. There is probable bilateral foraminal narrowing at L4-L5 secondary to distortion from the fracture. Electronically Signed   By: Macy Mis M.D.   On: 09/12/2021 18:37    Recent Labs: Lab Results  Component Value Date   WBC 4.2 08/15/2021   HGB 12.2 08/15/2021   PLT 225 08/15/2021   NA 136 08/15/2021   K 4.1 08/15/2021   CL 104 08/15/2021   CO2 26 08/15/2021   GLUCOSE 97 08/15/2021   BUN 10 08/15/2021   CREATININE 0.87 08/15/2021   BILITOT 0.7 06/28/2021   ALKPHOS 29 (L) 04/10/2020   AST 28 06/28/2021   ALT 17 06/28/2021   PROT 7.1 06/28/2021   ALBUMIN 4.2 04/10/2020   CALCIUM 8.7 (L) 08/15/2021   GFRAA 65 09/26/2020   QFTBGOLDPLUS NEGATIVE 09/26/2020    Speciality Comments: Prior therapy includes: [name] (cost), Olumiant-inadequate response, Imuran weakness, leflunomide-intolerance Prolia: 05/04/18, 11/12/18, 06/09/19, 12/07/19, 07/05/20  Procedures:  No procedures performed Allergies: Darvon [propoxyphene hcl], Demerol [meperidine], Penicillins, Imuran [azathioprine], Sulfa antibiotics, Arava [leflunomide], Lipitor [atorvastatin], and Vytorin [ezetimibe-simvastatin]   Assessment / Plan:     Visit Diagnoses: Rheumatoid arthritis involving multiple sites with positive rheumatoid factor ([place])  High risk medication use  Age-related osteoporosis without current pathological fracture  Vitamin D deficiency  Primary osteoarthritis of right hip  History of total hip replacement, left  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  DDD (degenerative disc disease), lumbar  Closed fracture of right wrist with routine healing, subsequent encounter  Rib pain on right side  Primary insomnia  Other fatigue  History of hyperlipidemia  History of multiple sclerosis ([place])  History of hypertension  History of depression  History of anxiety  Orders: No orders of the defined types were placed in this  encounter.  No orders of the defined types were placed in this encounter.   Face-to-face time spent with patient was *** minutes. Greater than 50% of time was spent in counseling and coordination of care.  Follow-Up  Instructions: No follow-ups on file.   Ofilia Neas, PA-C  Note - This record has been created using Dragon software.  Chart creation errors have been sought, but may not always  have been located. Such creation errors do not reflect on  the standard of medical care.

## 2021-09-20 NOTE — Brief Op Note (Signed)
09/20/2021 ? ?5:47 PM ? ?PATIENT:  Sherri [name]  81 y.o. female ? ?PRE-OPERATIVE DIAGNOSIS:  lumbar fracture ? ?POST-OPERATIVE DIAGNOSIS:  lumbar fracture ? ?PROCEDURE:  Procedure(s): ?Revision Lumbar Three - Sacral One fuison (N/A) ? ?SURGEON:  Surgeon(s) and Role: ?   Earnie Larsson, MD - Primary ? ?PHYSICIAN ASSISTANT:  ? ?ASSISTANTS: Bergman,NP  ? ?ANESTHESIA:   general ? ?EBL:  150 mL  ? ?BLOOD ADMINISTERED:none ? ?DRAINS: none  ? ?LOCAL MEDICATIONS USED:  MARCAINE    ? ?SPECIMEN:  No Specimen ? ?DISPOSITION OF SPECIMEN:  N/A ? ?COUNTS:  YES ? ?TOURNIQUET:  * No tourniquets in log * ? ?DICTATION: .Dragon Dictation ? ?PLAN OF CARE: Admit to inpatient  ? ?PATIENT DISPOSITION:  PACU - hemodynamically stable. ?  ?Delay start of Pharmacological VTE agent (>24hrs) due to surgical blood loss or risk of bleeding: yes ? ?

## 2021-09-20 NOTE — H&P (Signed)
?Sherri [name] is an 81 y.o. female.   ?Chief Complaint: Back pain ?HPI: 81 year old female recently status post L3-4 decompression and fusion above a prior L4-S1 fusion.  The patient has suffered osteoporotic compression collapse of L4 with subsidence of her L3-4 fusion construct settling onto the L5 body.  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  Patient presents now for reexploration and revision of her fusion in hopes of improving her symptoms. ? ?Past Medical History:  ?Diagnosis Date  ? Anxiety   ? takes Valium daily as needed  ? Basal cell carcinoma 01/21/1988  ? left nostril (MOHS), sup-right calf (CX35FU)  ? Basal cell carcinoma 03/22/1996  ? right calf (CX35FU)  ? Basal cell carcinoma 03/31/1995  ? left wing nose  ? Bruises easily   ? d/t meds  ? Cancer [facility])   ? basal cell ca, in situ- uterine   ? Cataracts, bilateral   ? removed bilateral  ? Chronic back pain   ? Dizziness   ? r/t to meds  ? GERD (gastroesophageal reflux disease)   ? no meds on a regular basis but will take Tums if needed  ? Headache(784.0)   ? r/t neck issues  ? History of bronchitis 6-85yr ago  ? History of colon polyps   ? benign  ? HOH (hard of hearing)   ? wears hearing aids  ? Hyperlipidemia   ? takes Atorvastatin on Mondays and Fridays  ? Hypertension   ? Joint pain   ? Joint swelling   ? Multiple sclerosis (HSevery   ? Neuromuscular disorder (HAnimas   ? Dr. RPark Liter [facility], follows M.S.  ? Nocturia   ? PONV (postoperative nausea and vomiting)   ? trouble urinating after surgery in 2014  ? Postoperative nausea and vomiting 01/11/2019  ? Rheumatoid arthritis ([place]   ? Dr VJani Gravelweekly, RA- hands- knees- feet   ? Rheumatoid arthritis(714.0)   ? Dr DEstanislado Pandytakes XMorrie Sheldondaily  ? Right wrist fracture   ? Skin cancer   ? Squamous cell carcinoma of skin 11/02/2001  ? in situ-right knee (cx359f  ? Squamous cell carcinoma  of skin 11/12/2011  ? in situ-left forearm (CX35FU), in situ-left foot (CX35FU)  ? Squamous cell carcinoma of skin 01/22/2012  ? in situ-left lower forearm (txpbx)  ? Squamous cell carcinoma of skin 11/17/2012  ? right shin (txpbx)  ? Squamous cell carcinoma of skin 10/28/2013  ? in situ-right shoulder (CX35FU)  ? Squamous cell carcinoma of skin 04/28/2014  ? well diff-right shin (txpbx)  ? Squamous cell carcinoma of skin 11/02/2014  ? in situ-Left shin (txpbx)  ? Squamous cell carcinoma of skin 12/15/2014  ? in situ-Left hand (txpbx), bowens-left side chest (txpbx)  ? Squamous cell carcinoma of skin 05/09/2015  ? in situ-left hand (txpbx)   ? Squamous cell carcinoma of skin 05/07/2016  ? in situ-left inner shin,ant, in situ-left inner shin, post, in situ-left outer forearm, in situ-right knuckle  ? Squamous cell carcinoma of skin 03/1302018  ? in situ-left shoulder (txpbx), in situ-right inner shin (txpbx), in situ-top of left foot (txpbx), KA- left forearm (txpbx)  ? Squamous cell carcinoma of skin 12/02/2016  ? in situ-left inner shin (txpbx)  ? Squamous cell carcinoma of skin 03/04/2017  ? in situ-left outer sup, shin (txpbx), in situ- right 2nd knuckle finger (txpbx), in situ- Left wrist (txpbx)  ? Squamous cell carcinoma of skin  04/06/2018  ? in situ-above left knee inner (txpbx)  ? Squamous cell carcinoma of skin 04/21/2018  ? in situ-right top hand (txpbx)  ? Squamous cell carcinoma of skin 07/08/2018  ? in situ-right lower inner shin (txpbx)  ? Squamous cell carcinoma of skin 04/27/2019  ? in situ- Left neck(CX35FU), In situ- right neck (CX35FU)  ? Urinary retention   ? sees Dr.Wrenn about 2 times a yr  ? ? ?Past Surgical History:  ?Procedure Laterality Date  ? ABDOMINAL HYSTERECTOMY    ? 1985  ? APPENDECTOMY    ? with TAH  ? BACK SURGERY    ? several  ? BREAST BIOPSY Right   ? Results were negative  ? BROW LIFT Bilateral 10/02/2016  ? Procedure: BILATERAL LOWER LID BLEPHAROPLASTY;  Surgeon: Clista Bernhardt, MD;  Location: [place];  Service: Plastics;  Laterality: Bilateral;  ? CARPAL TUNNEL RELEASE Right   ? cataracts    ? CERVICAL FUSION    ? Dr Joya Salm  ? CERVICAL FUSION  12/31/2012  ? Dr Joya Salm  ? CHEST TUBE INSERTION    ? for traumatic Pneumothorax  ? COLONOSCOPY  07/16/2010  ? normal   ? COLONOSCOPY W/ POLYPECTOMY  1997  ? negative since; Dr Deatra Ina  ? ESOPHAGOGASTRODUODENOSCOPY  07/16/2010  ? normal  ? eye lid raise    ? EYE SURGERY Bilateral   ? cataracts removed - /w IOL  ? JOINT REPLACEMENT    ? LUMBAR FUSION    ? Dr Joya Salm  ? NASAL SINUS SURGERY    ? POSTERIOR CERVICAL FUSION/FORAMINOTOMY N/A 12/31/2012  ? Procedure: POSTERIOR LATERAL CERVICAL FUSION/FORAMINOTOMY LEVEL 1 CERVICAL THREE-FOUR WITH LATERAL MASS SCREWS;  Surgeon: Floyce Stakes, MD;  Location: [place] [facility];  Service: Neurosurgery;  Laterality: N/A;  ? PTOSIS REPAIR Bilateral 10/02/2016  ? Procedure: INTERNAL PTOSIS REPAIR;  Surgeon: Clista Bernhardt, MD;  Location: [place];  Service: Plastics;  Laterality: Bilateral;  ? SKIN BIOPSY    ? THYROID SURGERY    ? R lobe removed- 1972, has grown back - CT last done- 2018  ? TOTAL HIP ARTHROPLASTY  12/22/2011  ? Procedure: TOTAL HIP ARTHROPLASTY;  Surgeon: Kerin Salen, MD;  Location: [place];  Service: Orthopedics;  Laterality: Left;  ? TOTAL SHOULDER ARTHROPLASTY    ? TUBAL LIGATION    ? UPPER GASTROINTESTINAL ENDOSCOPY  2012  ? 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  ? ? ?Family History  ?Problem Relation Age of Onset  ? Cancer Mother   ?     pancreatic  ? Diabetes Mother   ? Pancreatic cancer Mother   ? Heart disease Father   ?     Rheumatic  ? Cancer Father   ?     ? stomach  ? Stomach cancer Father   ? Cancer Sister   ?     stomach  ? Stomach cancer Sister   ? Cancer Maternal Aunt   ?     X 4; ? primary  ? Heart disease Paternal Aunt   ? Cancer Maternal Grandmother   ?     cervical  ? Colon cancer Other   ?     Aunts  ? Multiple sclerosis Daughter   ? Hypertension Neg Hx   ? Stroke Neg Hx   ? Esophageal  cancer Neg Hx   ? Rectal cancer Neg Hx   ? ?Social History:  reports that she quit smoking about 40 years ago. Her  smoking use included cigarettes. She has a 50.00 pack-year smoking history. She has never used smokeless tobacco. She reports current alcohol use of about 14.0 standard drinks per week. She reports that she does not use drugs. ? ?Allergies:  ?Allergies  ?Allergen Reactions  ? Darvon [Propoxyphene Hcl] Shortness Of Breath  ?  ? Dose Related ? ?Lowered respirations greatly  ? Demerol [Meperidine] Shortness Of Breath and Other (See Comments)  ?  Respiratory Distress  ? Penicillins Hives and Other (See Comments)  ?  Has patient had a PCN reaction causing immediate rash, facial/tongue/throat swelling, SOB or lightheadedness with hypotension: No ?SEVERE RASH INVOLVING MUCUS MEMBRANES or SKIN NECROSIS: #  #  #  YES  #  #  #  ?Has patient had a PCN reaction that required hospitalization No ?Has patient had a PCN reaction occurring within the last 10 years: No. ?  ? Imuran [Azathioprine] Other (See Comments)  ?  Weakness  ? Sulfa Antibiotics Nausea Only and Other (See Comments)  ?  States had severe indigestion and heartburn and nausea and had to stop taking  ? Arava [Leflunomide] Other (See Comments)  ?  Excessive weight gain  ? Lipitor [Atorvastatin] Nausea And Vomiting  ? Vytorin [Ezetimibe-Simvastatin] Nausea And Vomiting  ? ? ?Medications Prior to Admission  ?Medication Sig Dispense Refill  ? atorvastatin (LIPITOR) 10 MG tablet TAKE 1 TAB ON MONDAYS AND FRIDAY 27 tablet 3  ? Biotin 5000 MCG TABS Take 10,000 mcg by mouth every evening.    ? Calcium-Magnesium-Zinc (CAL-MAG-ZINC PO) Take 1 tablet by mouth in the morning.    ? Carboxymethylcellul-Glycerin (LUBRICATING EYE DROPS OP) Place 1 drop into both eyes daily as needed (dry eyes).    ? carvedilol (COREG) 6.25 MG tablet TAKE 1 TABLET BY MOUTH 2 TIMES DAILY WITH A MEAL. 180 tablet 1  ? cholecalciferol (VITAMIN D) 25 MCG (1000 UNIT) tablet Take 1,000 Units  by mouth in the morning.    ? cyclobenzaprine (FLEXERIL) 10 MG tablet Take 1 tablet (10 mg total) by mouth 3 (three) times daily as needed for muscle spasms. 30 tablet 0  ? diazepam (VALIUM) 5 MG tablet T

## 2021-09-20 NOTE — Progress Notes (Signed)
Pt medicated with fifty mcg of IV Fentanyl per verbal order for 50 -166mg from Dr. MJosephine Igo Pt states she has pain radiating down her left leg. She rates it "8" on 1-10 scale. Remaining 50 Mcg Fentanyl given to [place] Pt placed on O2 2liters and monitor for RR,HR,ETCO2,and pulse ox.imeter.  ?

## 2021-09-20 NOTE — Op Note (Signed)
Date of procedure: 09/20/2021 ? ?Date of dictation: Same ? ?Service: Neurosurgery ? ?Preoperative diagnosis: Osteoporosis related pathologic fracture of L4.  Patient status post recent L3-4 and remote L4-S1 decompression and fusion surgery.  Patient with severe left L4 radiculopathy. ? ?Postoperative diagnosis: Same ? ?Procedure Name: Reexploration of L3-S1 fusion with decompressive laminectomy and bilateral transpedicular decompressions at L4 ? ?L3, L5, S1 posterior lateral arthrodesis utilizing segmental pedicle screw fixation and local autografting ? ?Surgeon:Yechezkel Fertig A.Tamlyn Sides, M.D. ? ?Asst. Surgeon: Reinaldo Meeker ? ?Anesthesia: General ? ?Indication: 81 year old female who fairly recently status post L3-4 decompression and fusion surgery.  Initially patient did very well but suffered severe back and left lower extremity radicular pain a little over 1 week after surgery.  Work-up demonstrates evidence of osteoporosis related collapse of her L4 vertebral body to near vertebral plana status.  Her L4 neural foramina are completely collapsed and her pedicle screw instrumentation at L4 has torn out laterally.  Pedicle screw instrumentation at L3 appears to be intact.  Her prior remaining screws at L5 and S1 appear intact.  Plan is for revision decompression and fusion in hopes of improving her symptoms. ? ?Operative note: After induction anesthesia, patient position prone on the Wilson frame and properly padded.  Lumbar region prepped and draped sterilely.  Incision made overlying L3-S1.  Dissection performed bilaterally.  Retractor placed.  Fluoroscopy used.  Levels confirmed.  Epidural scar was resected.  The deformity where the L4 vertebral body had collapsed was apparent.  The pedicles of L4 were lying lateral and slightly superior to the pedicles of L5.  The pedicles screw construct at L3-4 was disassembled.  The pedicle screws of L4 were removed bilaterally.  The L3 and L4 nerve roots bilaterally were under severe  compression.  The only conceivable way to take to decompress these was to completely resect the pedicle complex of L4 bilaterally.  This was done removing the pedicle down to the level of the fracture vertebral body.  Both the L3 and L4 nerve roots were identified bilaterally and wide decompressions were performed.  There was no evidence of any residual compression.  There is no evidence of injury to thecal sac or nerve roots.  Attention then placed to fusion.  As the patient had 1/4 inch Medtronic system at L5-S1 the pedicle screws at L3 had to be removed and quarter inch Legacy 6.5 mm x 45 mm screws were placed and L3.  Short segment titanium rods and placed over the screw heads from L3 L5 and S1.  Locking caps placed over the screws and locking caps then engaged.  The locking caps on the L5 screw on the right failed to completely pop-off.  It was secured in place and I felt that it was better to leave it as is then to remove the screw and try yet another additional.  Residual transverse processes at L3 and L5 were decorticated.  Morselized autograft was packed posterior laterally.  Gelfoam was placed over the thecal sac.  Vancomycin powder was placed in the deep wound space.  Wound is closed in layers with Vicryl sutures in an interrupted 2-0 nylon on  the skin. ? ?

## 2021-09-20 NOTE — Anesthesia Preprocedure Evaluation (Addendum)
Anesthesia Evaluation  ?Patient identified by MRN, date of birth, ID band ?Patient awake ? ? ? ?Reviewed: ?Allergy & Precautions, NPO status , Patient's Chart, lab work & pertinent test results, reviewed documented beta blocker date and time  ? ?History of Anesthesia Complications ?(+) PONV and history of anesthetic complications ? ?Airway ?Mallampati: I ? ?TM Distance: >3 FB ?Neck ROM: Full ? ? ? Dental ? ?(+) Teeth Intact, Caps, Dental Advisory Given ?  ?Pulmonary ?former smoker,  ?  ?Pulmonary exam normal ?breath sounds clear to auscultation ? ? ? ? ? ? Cardiovascular ?hypertension, Pt. on medications and Pt. on home beta blockers ?+ Peripheral Vascular Disease  ?Normal cardiovascular exam ?Rhythm:Regular Rate:Normal ? ? ?  ?Neuro/Psych ? Headaches, Anxiety MS- in remission ? Neuromuscular disease   ? GI/Hepatic ?Neg liver ROS, GERD  Medicated,  ?Endo/Other  ?negative endocrine ROSOsteoporosis ? Renal/GU ?negative Renal ROS  ?negative genitourinary ?  ?Musculoskeletal ? ?(+) Arthritis , Rheumatoid disorders,  Osteoporosis ? ?S/P cervical fusion C4-C7  ? Abdominal ?  ?Peds ? Hematology ?  ?Anesthesia Other Findings ? ? Reproductive/Obstetrics ?Hx/o uterine Ca ? ?  ? ? ? ? ? ? ? ? ? ? ? ? ? ?  ?  ? ? ? ? ? ? ?Anesthesia Physical ?Anesthesia Plan ? ?ASA: 2 ? ?Anesthesia Plan: General  ? ?Post-op Pain Management: Minimal or no pain anticipated  ? ?Induction: Intravenous ? ?PONV Risk Score and Plan: 4 or greater and Treatment may vary due to age or medical condition, Ondansetron and Dexamethasone ? ?Airway Management Planned: Oral ETT ? ?Additional Equipment:  ? ?Intra-op Plan:  ? ?Post-operative Plan:  ? ?Informed Consent:  ? ?Plan Discussed with:  ? ?Anesthesia Plan Comments:   ? ? ? ? ? ? ?Anesthesia Quick Evaluation ? ?

## 2021-09-20 NOTE — Anesthesia Postprocedure Evaluation (Signed)
Anesthesia Post Note ? ?Patient: Sherri [name] ? ?Procedure(s) Performed: Revision Lumbar Three - Sacral One fuison (Back) ? ?  ? ?Patient location during evaluation: PACU ?Anesthesia Type: General ?Level of consciousness: awake and alert and oriented ?Pain management: pain level controlled ?Vital Signs Assessment: post-procedure vital signs reviewed and stable ?Respiratory status: spontaneous breathing, nonlabored ventilation and respiratory function stable ?Cardiovascular status: blood pressure returned to baseline and stable ?Postop Assessment: no apparent nausea or vomiting ?Anesthetic complications: no ? ? ?No notable events documented. ? ?Last Vitals:  ?Vitals:  ? 09/20/21 1815 09/20/21 1830  ?BP: 134/65 128/65  ?Pulse: 80   ?Resp: 17   ?Temp:    ?SpO2: 99%   ?  ?Last Pain:  ?Vitals:  ? 09/20/21 1830  ?TempSrc:   ?PainSc: 6   ? ? ?LLE Motor Response: Purposeful movement (09/20/21 1830) ?LLE Sensation: Full sensation (09/20/21 1830) ?RLE Motor Response: Purposeful movement (09/20/21 1830) ?RLE Sensation: Pain;Full sensation (09/20/21 1830) ?  ?  ? ?Ani Deoliveira A. ? ? ? ? ?

## 2021-09-20 NOTE — Transfer of Care (Signed)
Immediate Anesthesia Transfer of Care Note ? ?Patient: Sherri [name] ? ?Procedure(s) Performed: Revision Lumbar Three - Sacral One fuison (Back) ? ?Patient Location: PACU ? ?Anesthesia Type:General ? ?Level of Consciousness: awake and oriented ? ?Airway & Oxygen Therapy: Patient Spontanous Breathing ? ?Post-op Assessment: Report given to RN ? ?Post vital signs: Reviewed and stable ? ?Last Vitals:  ?Vitals Value Taken Time  ?BP 126/67   ?Temp    ?Pulse 83 09/20/21 1759  ?Resp 19 09/20/21 1759  ?SpO2 97 % 09/20/21 1759  ?Vitals shown include unvalidated device data. ? ?Last Pain:  ?Vitals:  ? 09/20/21 1445  ?TempSrc:   ?PainSc: 5   ?   ? ?Patients Stated Pain Goal: 2 (09/20/21 1419) ? ?Complications: No notable events documented. ?

## 2021-09-20 NOTE — Progress Notes (Signed)
Pt is s/p fusion and decompression surgery. She doesn't have a drain in place. Pre-op vanc was given around 3PM. Due to her renal function, she doesn't need any further post op abx.  ? ? ?Onnie Boer, PharmD, BCIDP, AAHIVP, CPP ?Infectious Disease Pharmacist ?09/20/2021 9:12 PM ? ? ?

## 2021-09-21 MED ORDER — HYDROCODONE-ACETAMINOPHEN 10-325 MG PO TABS: 1.0000 | ORAL_TABLET | ORAL | 0 refills | Status: DC | PRN

## 2021-09-21 MED ORDER — CYCLOBENZAPRINE HCL 10 MG PO TABS: 10.0000 mg | ORAL_TABLET | Freq: Three times a day (TID) | ORAL | 0 refills | Status: DC | PRN

## 2021-09-21 NOTE — Progress Notes (Signed)
Patient is discharged from room 3C04 at this time. Alert and in stable condition. IV site d/c'd and instructions read to patient with understanding verbalized and all questions answered. Left unit via wheelchair with all belongings at side. 

## 2021-09-21 NOTE — Evaluation (Signed)
Occupational Therapy Evaluation ?Patient Details ?Name: Sherri [name] ?MRN: [id number] ?DOB: [birth year] ?Today's Date: 09/21/2021 ? ? ?History of Present Illness Pt is a 81 y.o. female who presented 09/20/21 for revision of L3-S1 fusion with decompression secondary to osteoporotic compression collapse of L4 with subsidence of her L3-4 fusion construct settling onto the L5 body s/p recent L3-4 decompression and fusion above a prior L4-S1 fusion. PMH anxiety, basal cell carcinoma, HTN, MS, neuromuscular disorder, RA R wrist fracture, back surg, cervical fusion 2014, L THA total, total shoulder arthoroplasty  ? ?Clinical Impression ?  ?Patient admitted for the procedure above.  PTA she lives at home with her spouse, and will have assist as needed from family.  Currently she is needing up to supervision and cues for lower body dressing, and supervision for bed mobility and transfers/toileting.  Patient should do fine at home with assist from family.  No further needs int he acute setting.  All questions answered.    ?   ? ?Recommendations for follow up therapy are one component of a multi-disciplinary discharge planning process, led by the attending physician.  Recommendations may be updated based on patient status, additional functional criteria and insurance authorization.  ? ?Follow Up Recommendations ? No OT follow up  ?  ?Assistance Recommended at Discharge Intermittent Supervision/Assistance  ?Patient can return home with the following Assist for transportation;Assistance with cooking/housework ? ?  ?Functional Status Assessment ? Patient has had a recent decline in their functional status and demonstrates the ability to make significant improvements in function in a reasonable and predictable amount of time.  ?Equipment Recommendations ? None recommended by OT  ?  ?Recommendations for Other Services   ? ? ?  ?Precautions / Restrictions Precautions ?Precautions: Back;Fall ?Precaution Booklet Issued: Yes  (comment) ?Precaution Comments: cues for bending and twisting as needed during self care ?Required Braces or Orthoses: Spinal Brace ?Spinal Brace: Lumbar corset;Applied in sitting position ?Restrictions ?Weight Bearing Restrictions: No  ? ?  ? ?Mobility Bed Mobility ?Overal bed mobility: Needs Assistance ?Bed Mobility: Sit to Sidelying ?  ?  ?  ?  ?Sit to sidelying: Min guard ?General bed mobility comments: patient trying to twist and swing legs up, cued to side lie and bring feet up ?  ? ?Transfers ?Overall transfer level: Needs assistance ?Equipment used: Rolling walker (2 wheels) ?Transfers: Sit to/from Stand ?Sit to Stand: Supervision ?  ?  ?  ?  ?  ?  ?  ? ?  ?Balance Overall balance assessment: Needs assistance ?Sitting-balance support: Feet supported ?Sitting balance-Leahy Scale: Good ?  ?  ?Standing balance support: Reliant on assistive device for balance ?Standing balance-Leahy Scale: Fair ?  ?  ?  ?  ?  ?  ?  ?  ?  ?  ?  ?  ?   ? ?ADL either performed or assessed with clinical judgement  ? ?ADL   ?  ?  ?Grooming: Set up;Standing ?  ?  ?  ?  ?  ?Upper Body Dressing : Set up;Sitting ?  ?Lower Body Dressing: Supervision/safety ?Lower Body Dressing Details (indicate cue type and reason): able to figure four, but cues to dress weaker R leg first. ?Toilet Transfer: Supervision/safety;Rolling walker (2 wheels);Regular Toilet ?  ?  ?  ?  ?  ?  ?   ? ? ? ?Vision Patient Visual Report: No change from baseline ?   ?   ?Perception Perception ?Perception: Not tested ?  ?Praxis Praxis ?Praxis: Not tested ?  ? ?  Pertinent Vitals/Pain Pain Assessment ?Pain Assessment: Faces ?Faces Pain Scale: Hurts little more ?Pain Location: back, R thigh ?Pain Descriptors / Indicators: Pressure, Radiating, Sore ?Pain Intervention(s): Monitored during session  ? ? ? ?Hand Dominance Right ?  ?Extremity/Trunk Assessment Upper Extremity Assessment ?Upper Extremity Assessment: Overall WFL for tasks assessed ?  ?Lower Extremity  Assessment ?Lower Extremity Assessment: Defer to PT evaluation ?RLE Deficits / Details: MMT scores of 4+ knee extension, 4+ ankle dorsiflexion; numbness noted at arch of foot but intact elsewhere ?RLE Sensation: decreased light touch ?LLE Deficits / Details: MMT scores of 4 knee extension, 4 ankle dorsiflexion; reports intact sensation throughout ?LLE Sensation: WNL ?  ?Cervical / Trunk Assessment ?Cervical / Trunk Assessment: Back Surgery ?  ?Communication Communication ?Communication: No difficulties ?  ?Cognition Arousal/Alertness: Awake/alert ?Behavior During Therapy: [facility] for tasks assessed/performed ?Overall Cognitive Status: Within Functional Limits for tasks assessed ?  ?  ?  ?  ?  ?  ?  ?  ?  ?  ?  ?  ?  ?  ?  ?  ?  ?  ?  ?General Comments  VSS on RA ? ?  ?Exercises   ?  ?Shoulder Instructions    ? ? ?Home Living Family/patient expects to be discharged to:: Private residence ?Living Arrangements: Spouse/significant other ?Available Help at Discharge: Family;Available 24 hours/day ?Type of Home: House ?Home Access: Stairs to enter;Ramped entrance ?Entrance Stairs-Number of Steps: 4 ?Entrance Stairs-Rails: Right ?Home Layout: Two level;Able to live on main level with bedroom/bathroom ?Alternate Level Stairs-Number of Steps: flight ?  ?Bathroom Shower/Tub: Walk-in shower ?  ?Bathroom Toilet: Handicapped height ?Bathroom Accessibility: Yes ?How Accessible: Accessible via walker ?Home Equipment: BSC/3in1;Grab bars - toilet;Rollator (4 wheels);Rolling Walker (2 wheels);Shower seat - built in;Grab bars - tub/shower;Wheelchair - manual;Hospital bed ?  ?  ?  ? ?  ?Prior Functioning/Environment Prior Level of Function : Independent/Modified Independent;Needs assist;History of Falls (last six months) ?  ?  ?  ?  ?  ?  ?Mobility Comments: Prior to initial cervical surgery ~1 month ago per pt and family, she was IND without DME. Since her back surgery she began to use a RW with SUP then had x1 fall and has since needed  assistance for all mobility for safety ?ADLs Comments: IND prior to first cervical surgery ~1 month prior per pt and family, but has since needed assistance for cooking/cleaning since back surgeries and x1 recent fall. ?  ? ?  ?  ?OT Problem List: Pain;Impaired balance (sitting and/or standing) ?  ?   ?OT Treatment/Interventions:    ?  ?OT Goals(Current goals can be found in the care plan section) Acute Rehab OT Goals ?Patient Stated Goal: hoping to return home today ?OT Goal Formulation: With patient ?Time For Goal Achievement: 09/22/21 ?Potential to Achieve Goals: Good  ?OT Frequency:   ?  ? ?Co-evaluation   ?  ?  ?  ?  ? ?  ?AM-PAC OT "6 Clicks" Daily Activity     ?Outcome Measure Help from another person eating meals?: None ?Help from another person taking care of personal grooming?: None ?Help from another person toileting, which includes using toliet, bedpan, or urinal?: A Little ?Help from another person bathing (including washing, rinsing, drying)?: A Little ?Help from another person to put on and taking off regular upper body clothing?: None ?Help from another person to put on and taking off regular lower body clothing?: A Little ?6 Click Score: 21 ?  ?End of Session  Equipment Utilized During Treatment: Back brace ?Nurse Communication: Mobility status ? ?Activity Tolerance: Patient tolerated treatment well ?Patient left: in bed;with call bell/phone within reach;with family/visitor present ? ?OT Visit Diagnosis: Unsteadiness on feet (R26.81);Muscle weakness (generalized) (M62.81)  ?              ?Time: 3536-1443 ?OT Time Calculation (min): 16 min ?Charges:  OT General Charges ?$OT Visit: 1 Visit ?OT Evaluation ?$OT Eval Moderate Complexity: 1 Mod ? ?09/21/2021 ? ?RP, OTR/L ? ?Acute Rehabilitation Services ? ?Office:  [phone] ? ? ?Sherri [name] ?09/21/2021, 8:54 AM ?

## 2021-09-21 NOTE — Discharge Summary (Signed)
Physician Discharge Summary  ?Patient ID: ?Sherri [name] ?MRN: [id number] ?DOB/AGE: 12/27/1940 81 y.o. ? ?Admit date: 09/20/2021 ?Discharge date: 09/21/2021 ? ?Admission Diagnoses: Osteoporosis related pathologic fracture of L4.  Patient status post recent L3-4 and remote L4-S1 decompression and fusion surgery.  Patient with severe left L4 radiculopathy. ?   ? ? ?Discharge Diagnoses: same ? ? ?Discharged Condition: good ? ?Hospital Course: The patient was admitted on 09/20/2021 and taken to the operating room where the patient underwent reexploration of fusion L3-S1. The patient tolerated the procedure well and was taken to the recovery room and then to the flor in stable condition. The hospital course was routine. There were no complications. The wound remained clean dry and intact. Pt had appropriate back soreness. No complaints of leg pain or new N/T/W. The patient remained afebrile with stable vital signs, and tolerated a regular diet. The patient continued to increase activities, and pain was well controlled with oral pain medications.  ? ?Consults: None ? ?Significant Diagnostic Studies:  ?Results for orders placed or performed during the hospital encounter of 09/20/21  ?Surgical pcr screen  ? Specimen: Nasal Mucosa; Nasal Swab  ?Result Value Ref Range  ? MRSA, PCR NEGATIVE NEGATIVE  ? Staphylococcus aureus NEGATIVE NEGATIVE  ?CBC per protocol  ?Result Value Ref Range  ? WBC 5.0 4.0 - 10.5 K/uL  ? RBC 4.03 3.87 - 5.11 MIL/uL  ? Hemoglobin 13.6 12.0 - 15.0 g/dL  ? HCT 40.6 36.0 - 46.0 %  ? MCV 100.7 (H) 80.0 - 100.0 fL  ? MCH 33.7 26.0 - 34.0 pg  ? MCHC 33.5 30.0 - 36.0 g/dL  ? RDW 15.9 (H) 11.5 - 15.5 %  ? Platelets 331 150 - 400 K/uL  ? nRBC 0.0 0.0 - 0.2 %  ?Basic metabolic panel per protocol  ?Result Value Ref Range  ? Sodium 137 135 - 145 mmol/L  ? Potassium 3.6 3.5 - 5.1 mmol/L  ? Chloride 102 98 - 111 mmol/L  ? CO2 28 22 - 32 mmol/L  ? Glucose, Bld 108 (H) 70 - 99 mg/dL  ? BUN 6 (L) 8 - 23 mg/dL  ?  Creatinine, Ser 0.72 0.44 - 1.00 mg/dL  ? Calcium 8.8 (L) 8.9 - 10.3 mg/dL  ? GFR, Estimated >60 >60 mL/min  ? Anion gap 7 5 - 15  ?Type and screen  ?Result Value Ref Range  ? ABO/RH(D) A POS   ? Antibody Screen NEG   ? Sample Expiration    ?  09/23/2021,2359 ?Performed at [facility], [place] [address], [place], [place] [address] ?  ? ?*Note: Due to a large number of results and/or encounters for the requested time period, some results have not been displayed. A complete set of results can be found in Results Review.  ? ? ?DG Lumbar Spine 2-3 Views ? ?Result Date: 09/20/2021 ?CLINICAL DATA:  Intraoperative fluoroscopy. L3 through S1 fusion revision. EXAM: LUMBAR SPINE - 2-3 VIEW COMPARISON:  Lumbar spine radiographs 09/12/2021 FINDINGS: Images were performed intraoperatively without the presence of a radiologist. Posterior instrumented fusion is seen at L3, in between the L4 and L5 vertebral bodies, and the S1 vertebral body. Note is made of previously seen collapsed L4 vertebral body. Total fluoroscopy images: Total fluoroscopy time: 8 seconds Total dose: Radiation Exposure Index (as provided by the fluoroscopic device): 2.68 mGy air Kerma Please see intraoperative findings for further detail. IMPRESSION: Intraoperative fluoroscopy for lower lumbar spine fusion revision. Electronically Signed   By: Jori Moll  Viola M.D.   On: 09/20/2021 19:37  ? ?CT LUMBAR SPINE WO CONTRAST ? ?Result Date: 09/12/2021 ?CLINICAL DATA:  Recent fall with L4 fracture, recent surgery EXAM: CT LUMBAR SPINE WITHOUT CONTRAST TECHNIQUE: Multidetector CT imaging of the lumbar spine was performed without intravenous contrast administration. Multiplanar CT image reconstructions were also generated. RADIATION DOSE REDUCTION: This exam was performed according to the departmental dose-optimization program which includes automated exposure control, adjustment of the mA and/or kV according to patient size and/or use of iterative reconstruction  technique. COMPARISON:  None. FINDINGS: Segmentation: 5 lumbar type vertebrae. Alignment: Approximately 6 mm anterolisthesis at L3-L4. Vertebrae: Osseous demineralization. Postoperative changes are present with rods and pedicle screws at L3-L4. There is subsidence of a left-sided interbody spacer into L3. Subsidence right sided interbody spacer into fractured L4 vertebral body. Rods and pedicle screws are also present at L5-S1. Interbody spacers are present at L4-L5 and L5-S1. Laminectomies are present at the operative levels. Streak artifact associated with the hardware. Paraspinal and other soft tissues: Extensive atherosclerosis. Disc levels: L1-L2: Partially calcified disc bulge. Facet hypertrophy with ligamentum flavum thickening. Mild to moderate canal stenosis. Mild to moderate foraminal stenosis. L2-L3: Partially calcified disc bulge. Facet hypertrophy with ligamentum flavum thickening. Moderate canal stenosis. Mild foraminal stenosis. L3-L4: Operative level. Anterolisthesis. Endplate osteophytes and endplate retropulsion. Facet hypertrophy. The canal is not well evaluated due to artifact. Suspect persistent left foraminal narrowing. L4-L5: Operative level. The canal is not well evaluated due to artifact. There is probable bilateral foraminal narrowing secondary to distortion from compression fracture. L5-S1: Operative level. Canal is not well evaluated due to artifact. Endplate osteophytes are present. No significant foraminal narrowing. IMPRESSION: Known L4 fracture. Postoperative changes as described with intact hardware. There is subsidence of the L3-L4 interbody spacers into L3 and L4. Canal is not well evaluated at the fracture and operative levels due to artifact. There is probable bilateral foraminal narrowing at L4-L5 secondary to distortion from the fracture. Electronically Signed   By: Macy Mis M.D.   On: 09/12/2021 18:37  ? ?DG C-Arm 1-60 Min-No Report ? ?Result Date: 09/20/2021 ?Fluoroscopy  was utilized by the requesting physician.  No radiographic interpretation.   ? ?Antibiotics:  ?Anti-infectives (From admission, onward)  ? ? Start     Dose/Rate Route Frequency Ordered Stop  ? 09/21/21 0600  vancomycin (VANCOCIN) IVPB 1000 mg/200 mL premix       ? 1,000 mg ?200 mL/hr over 60 Minutes Intravenous On call to O.R. 09/20/21 1242 09/21/21 0651  ? 09/20/21 1714  vancomycin (VANCOCIN) powder  Status:  Discontinued       ?   As needed 09/20/21 1715 09/20/21 1755  ? ?  ? ? ?Discharge Exam: ?Blood pressure 100/60, pulse 99, temperature 98 ?F (36.7 ?C), temperature source Oral, resp. rate 18, height '5\' 1"'$  (1.549 m), weight 54 kg, SpO2 100 %. ?Neurologic: Grossly normal ?Ambulating and voiding well incision cdi  ? ?Discharge Medications:   ?Allergies as of 09/21/2021   ? ?   Reactions  ? Darvon [propoxyphene Hcl] Shortness Of Breath  ? ? Dose Related ? ?Lowered respirations greatly  ? Demerol [meperidine] Shortness Of Breath, Other (See Comments)  ? Respiratory Distress  ? Penicillins Hives, Other (See Comments)  ? Has patient had a PCN reaction causing immediate rash, facial/tongue/throat swelling, SOB or lightheadedness with hypotension: No ?SEVERE RASH INVOLVING MUCUS MEMBRANES or SKIN NECROSIS: #  #  #  YES  #  #  #  ?Has patient had  a PCN reaction that required hospitalization No ?Has patient had a PCN reaction occurring within the last 10 years: No.  ? Imuran [azathioprine] Other (See Comments)  ? Weakness  ? Sulfa Antibiotics Nausea Only, Other (See Comments)  ? States had severe indigestion and heartburn and nausea and had to stop taking  ? Arava [leflunomide] Other (See Comments)  ? Excessive weight gain  ? Lipitor [atorvastatin] Nausea And Vomiting  ? Vytorin [ezetimibe-simvastatin] Nausea And Vomiting  ? ?  ? ?  ?Medication List  ?  ? ?TAKE these medications   ? ?atorvastatin 10 MG tablet ?Commonly known as: LIPITOR ?TAKE 1 TAB ON MONDAYS AND FRIDAY ?  ?Biotin 5000 MCG Tabs ?Take 10,000 mcg by mouth  every evening. ?  ?CAL-MAG-ZINC PO ?Take 1 tablet by mouth in the morning. ?  ?carvedilol 6.25 MG tablet ?Commonly known as: COREG ?TAKE 1 TABLET BY MOUTH 2 TIMES DAILY WITH A MEAL. ?  ?cholecalciferol 25 MCG (10

## 2021-09-21 NOTE — Evaluation (Signed)
Physical Therapy Evaluation ?Patient Details ?Name: Sherri [name] ?MRN: [id number] ?DOB: September 16, 1940 ?Today's Date: 09/21/2021 ? ?History of Present Illness ? Pt is a 81 y.o. female who presented 09/20/21 for revision of L3-S1 fusion with decompression secondary to osteoporotic compression collapse of L4 with subsidence of her L3-4 fusion construct settling onto the L5 body s/p recent L3-4 decompression and fusion above a prior L4-S1 fusion. PMH anxiety, basal cell carcinoma, HTN, MS, neuromuscular disorder, RA R wrist fracture, back surg, cervical fusion 2014, L THA total, total shoulder arthoroplasty ?  ?Clinical Impression ? Pt presents with condition above and deficits mentioned below, see PT Problem List. About a month ago pt was IND without DME, but since having cervical and spinal surgeries she has required a RW and SUP for mobility. After she had a recent fall she has needed some assistance for all mobility. Currently, pt displays bil lower extremity weakness (L>R), decreased sensation in her R arch of her foot, and decreased R hip AROM secondary to soreness following yesterday's procedure. She is at high risk for falls, displaying x1 LOB when her L knee buckled when attempting to ascend a stair leading with her L today, requiring maxA to safely recover. When ambulating, she requires min guard for safety while using a RW. Educated pt and granddaughter to use the ramp to access the home or if they do navigate the stairs to have +2 assistance and lead up with R stronger leg and down with L weaker leg for safety. They verbalized understanding. Recommending HHPT to maximize her return to baseline. Will continue to follow acutely. ?   ? ?Recommendations for follow up therapy are one component of a multi-disciplinary discharge planning process, led by the attending physician.  Recommendations may be updated based on patient status, additional functional criteria and insurance authorization. ? ?Follow Up  Recommendations Home health PT ? ?  ?Assistance Recommended at Discharge Frequent or constant Supervision/Assistance  ?Patient can return home with the following ? A little help with walking and/or transfers;A little help with bathing/dressing/bathroom;Assistance with cooking/housework;Assist for transportation;Help with stairs or ramp for entrance ? ?  ?Equipment Recommendations None recommended by PT  ?Recommendations for Other Services ?    ?  ?Functional Status Assessment Patient has had a recent decline in their functional status and demonstrates the ability to make significant improvements in function in a reasonable and predictable amount of time.  ? ?  ?Precautions / Restrictions Precautions ?Precautions: Back;Fall ?Precaution Booklet Issued: Yes (comment) ?Precaution Comments: reviewed precautions, good compliance ?Required Braces or Orthoses: Spinal Brace ?Spinal Brace: Lumbar corset;Applied in sitting position ?Restrictions ?Weight Bearing Restrictions: No  ? ?  ? ?Mobility ? Bed Mobility ?Overal bed mobility: Modified Independent ?Bed Mobility: Rolling, Sidelying to Sit ?Rolling: Modified independent (Device/Increase time) ?Sidelying to sit: Modified independent (Device/Increase time), HOB elevated ?  ?  ?  ?General bed mobility comments: Pt able to roll and transition sidelying to sit EOB with HOB elevated using bed rails without assistance. ?  ? ?Transfers ?Overall transfer level: Needs assistance ?Equipment used: Rolling walker (2 wheels) ?Transfers: Sit to/from Stand ?Sit to Stand: Min guard ?  ?  ?  ?  ?  ?General transfer comment: Extra time and cues for hand placement, min guard for safety ?  ? ?Ambulation/Gait ?Ambulation/Gait assistance: Min guard ?Gait Distance (Feet): 300 Feet ?Assistive device: Rolling walker (2 wheels) ?Gait Pattern/deviations: Step-through pattern, Decreased stride length, Trunk flexed ?Gait velocity: reduced ?Gait velocity interpretation: <1.8 ft/sec, indicate of risk  for  recurrent falls ?  ?General Gait Details: Pt with slow, fairly steady gait with RW, no LOB, min guard for safety. ? ?Stairs ?Stairs: Yes ?Stairs assistance: Min assist, Max assist ?Stair Management: Step to pattern, Forwards, One rail Right, One rail Left, Sideways ?Number of Stairs: 3 ?General stair comments: Pt initially ascending leading with L foot and demonstrated significant knee buckling, needing maxA to recover. Cued pt to lead up with R stronger leg instead, needing minA to navigate remaining stairs with bil hands on R rail, semi-sideways. Cues provided to descend leading with L leg with minA and bil hands on L rail semi-sideways, no further LOB bouts. Educated pt and granddaughter on guarding pt with all standing mobility due to her risk for falls and to provide +2 assistance on stairs leading up with R and down with L leg for safety and to use ramp instead of stairs if able. ? ?Wheelchair Mobility ?  ? ?Modified Rankin (Stroke Patients Only) ?  ? ?  ? ?Balance Overall balance assessment: Needs assistance ?Sitting-balance support: No upper extremity supported, Feet supported ?Sitting balance-Leahy Scale: Good ?Sitting balance - Comments: Able to bring L leg up to R knee to manage sock without LOB ?  ?Standing balance support: Bilateral upper extremity supported, During functional activity, Reliant on assistive device for balance ?Standing balance-Leahy Scale: Poor ?Standing balance comment: Reliant on RW with x1 LOB needing maxA to recover when on stairs. ?  ?  ?  ?  ?  ?  ?  ?  ?  ?  ?  ?   ? ? ? ?Pertinent Vitals/Pain Pain Assessment ?Pain Assessment: Faces ?Faces Pain Scale: Hurts a little bit ?Pain Location: back, R thigh ?Pain Descriptors / Indicators: Discomfort, Grimacing, Operative site guarding ?Pain Intervention(s): Limited activity within patient's tolerance, Monitored during session, Repositioned  ? ? ?Home Living Family/patient expects to be discharged to:: Private residence ?Living  Arrangements: Spouse/significant other ?Available Help at Discharge: Family;Available 24 hours/day (daughter, son, and granddaughter assist) ?Type of Home: House ?Home Access: Stairs to enter;Ramped entrance ?Entrance Stairs-Rails: Right ?Entrance Stairs-Number of Steps: 4 ?Alternate Level Stairs-Number of Steps: flight ?Home Layout: Two level;Able to live on main level with bedroom/bathroom ?Home Equipment: BSC/3in1;Grab bars - toilet;Rollator (4 wheels);Rolling Walker (2 wheels);Shower seat - built in;Grab bars - tub/shower;Wheelchair - manual;Hospital bed ?   ?  ?Prior Function Prior Level of Function : Independent/Modified Independent;Needs assist;History of Falls (last six months) ?  ?  ?  ?  ?  ?  ?Mobility Comments: Prior to initial cervical surgery ~1 month ago per pt and family, she was IND without DME. Since her back surgery she began to use a RW with SUP then had x1 fall and has since needed assistance for all mobility for safety ?ADLs Comments: IND prior to first cervical surgery ~1 month prior per pt and family, but has since needed assistance for cooking/cleaning since back surgeries and x1 recent fall. ?  ? ? ?Hand Dominance  ?   ? ?  ?Extremity/Trunk Assessment  ? Upper Extremity Assessment ?Upper Extremity Assessment: Defer to OT evaluation ?  ? ?Lower Extremity Assessment ?Lower Extremity Assessment: RLE deficits/detail;LLE deficits/detail ?RLE Deficits / Details: MMT scores of 4+ knee extension, 4+ ankle dorsiflexion; numbness noted at arch of foot but intact elsewhere ?RLE Sensation: decreased light touch ?LLE Deficits / Details: MMT scores of 4 knee extension, 4 ankle dorsiflexion; reports intact sensation throughout ?LLE Sensation: WNL ?  ? ?Cervical / Trunk Assessment ?  Cervical / Trunk Assessment: Back Surgery  ?Communication  ? Communication: No difficulties  ?Cognition Arousal/Alertness: Awake/alert ?Behavior During Therapy: [facility] for tasks assessed/performed ?Overall Cognitive Status: Within  Functional Limits for tasks assessed ?  ?  ?  ?  ?  ?  ?  ?  ?  ?  ?  ?  ?  ?  ?  ?  ?  ?  ?  ? ?  ?General Comments General comments (skin integrity, edema, etc.): Educated pt and granddaughter on car transfers ? ?  ?Exe

## 2021-09-21 NOTE — TOC Transition Note (Signed)
Transition of Care (TOC) - CM/SW Discharge Note ? ? ?Patient Details  ?Name: Sherri [name] ?MRN: [id number] ?Date of Birth: 1941/04/16 ? ?Transition of Care (TOC) CM/SW Contact:  ?Carles Collet, RN ?Phone Number: ?09/21/2021, 9:16 AM ? ? ?Clinical Narrative:   Spoke w patient at bedside, no preference for [facility] agency. Referral accepted by [facility]. Requested [facility] orders for PT.  ? ? ? ?Final next level of care: [place] ?Barriers to Discharge: No Barriers Identified ? ? ?Patient Goals and CMS Choice ?Patient states their goals for this hospitalization and ongoing recovery are:: to go home ?CMS Medicare.gov Compare Post Acute Care list provided to:: Patient ?Choice offered to / list presented to : Patient ? ?Discharge Placement ?  ?           ?  ?  ?  ?  ? ?Discharge Plan and Services ?  ?  ?           ?  ?  ?  ?  ?  ?HH Arranged: PT ?[place] Agency: [place] [facility] ?Date HH Agency Contacted: 09/21/21 ?Time [place]: [phone] ?Representative spoke with at [place]: [name] ? ?Social Determinants of Health (SDOH) Interventions ?  ? ? ?Readmission Risk Interventions ?   ? View : No data to display.  ?  ?  ?  ? ? ? ? ? ?

## 2021-09-23 DIAGNOSIS — G8929 Other chronic pain: Secondary | ICD-10-CM | POA: Diagnosis not present

## 2021-09-23 DIAGNOSIS — Z87891 Personal history of nicotine dependence: Secondary | ICD-10-CM | POA: Diagnosis not present

## 2021-09-23 DIAGNOSIS — M069 Rheumatoid arthritis, unspecified: Secondary | ICD-10-CM | POA: Diagnosis not present

## 2021-09-23 DIAGNOSIS — K219 Gastro-esophageal reflux disease without esophagitis: Secondary | ICD-10-CM | POA: Diagnosis not present

## 2021-09-23 DIAGNOSIS — Z79620 Long term (current) use of immunosuppressive biologic: Secondary | ICD-10-CM | POA: Diagnosis not present

## 2021-09-23 DIAGNOSIS — M5416 Radiculopathy, lumbar region: Secondary | ICD-10-CM | POA: Diagnosis not present

## 2021-09-23 DIAGNOSIS — G709 Myoneural disorder, unspecified: Secondary | ICD-10-CM | POA: Diagnosis not present

## 2021-09-23 DIAGNOSIS — Z7952 Long term (current) use of systemic steroids: Secondary | ICD-10-CM | POA: Diagnosis not present

## 2021-09-23 DIAGNOSIS — Z981 Arthrodesis status: Secondary | ICD-10-CM | POA: Diagnosis not present

## 2021-09-23 DIAGNOSIS — G35 Multiple sclerosis: Secondary | ICD-10-CM | POA: Diagnosis not present

## 2021-09-23 DIAGNOSIS — R339 Retention of urine, unspecified: Secondary | ICD-10-CM | POA: Diagnosis not present

## 2021-09-23 DIAGNOSIS — I10 Essential (primary) hypertension: Secondary | ICD-10-CM | POA: Diagnosis not present

## 2021-09-23 DIAGNOSIS — Z9181 History of falling: Secondary | ICD-10-CM | POA: Diagnosis not present

## 2021-09-23 DIAGNOSIS — Z79891 Long term (current) use of opiate analgesic: Secondary | ICD-10-CM | POA: Diagnosis not present

## 2021-09-23 DIAGNOSIS — Z79631 Long term (current) use of antimetabolite agent: Secondary | ICD-10-CM | POA: Diagnosis not present

## 2021-09-23 DIAGNOSIS — M48061 Spinal stenosis, lumbar region without neurogenic claudication: Secondary | ICD-10-CM | POA: Diagnosis not present

## 2021-09-23 DIAGNOSIS — E785 Hyperlipidemia, unspecified: Secondary | ICD-10-CM | POA: Diagnosis not present

## 2021-09-23 DIAGNOSIS — M8008XD Age-related osteoporosis with current pathological fracture, vertebra(e), subsequent encounter for fracture with routine healing: Secondary | ICD-10-CM | POA: Diagnosis not present

## 2021-09-26 MED FILL — Heparin Sodium (Porcine) Inj 1000 Unit/ML: INTRAMUSCULAR | Qty: 30 | Status: AC

## 2021-09-26 MED FILL — Sodium Chloride IV Soln 0.9%: INTRAVENOUS | Qty: 1000 | Status: AC

## 2021-09-27 DIAGNOSIS — Z87891 Personal history of nicotine dependence: Secondary | ICD-10-CM | POA: Diagnosis not present

## 2021-09-27 DIAGNOSIS — Z981 Arthrodesis status: Secondary | ICD-10-CM | POA: Diagnosis not present

## 2021-09-27 DIAGNOSIS — M069 Rheumatoid arthritis, unspecified: Secondary | ICD-10-CM | POA: Diagnosis not present

## 2021-09-27 DIAGNOSIS — Z7952 Long term (current) use of systemic steroids: Secondary | ICD-10-CM | POA: Diagnosis not present

## 2021-09-27 DIAGNOSIS — M8008XD Age-related osteoporosis with current pathological fracture, vertebra(e), subsequent encounter for fracture with routine healing: Secondary | ICD-10-CM | POA: Diagnosis not present

## 2021-09-27 DIAGNOSIS — M5416 Radiculopathy, lumbar region: Secondary | ICD-10-CM | POA: Diagnosis not present

## 2021-09-27 DIAGNOSIS — M48061 Spinal stenosis, lumbar region without neurogenic claudication: Secondary | ICD-10-CM | POA: Diagnosis not present

## 2021-09-27 DIAGNOSIS — R339 Retention of urine, unspecified: Secondary | ICD-10-CM | POA: Diagnosis not present

## 2021-09-27 DIAGNOSIS — Z79631 Long term (current) use of antimetabolite agent: Secondary | ICD-10-CM | POA: Diagnosis not present

## 2021-09-27 DIAGNOSIS — E785 Hyperlipidemia, unspecified: Secondary | ICD-10-CM | POA: Diagnosis not present

## 2021-09-27 DIAGNOSIS — G8929 Other chronic pain: Secondary | ICD-10-CM | POA: Diagnosis not present

## 2021-09-27 DIAGNOSIS — Z79620 Long term (current) use of immunosuppressive biologic: Secondary | ICD-10-CM | POA: Diagnosis not present

## 2021-09-27 DIAGNOSIS — G35 Multiple sclerosis: Secondary | ICD-10-CM | POA: Diagnosis not present

## 2021-09-27 DIAGNOSIS — I10 Essential (primary) hypertension: Secondary | ICD-10-CM | POA: Diagnosis not present

## 2021-09-27 DIAGNOSIS — Z79891 Long term (current) use of opiate analgesic: Secondary | ICD-10-CM | POA: Diagnosis not present

## 2021-09-27 DIAGNOSIS — K219 Gastro-esophageal reflux disease without esophagitis: Secondary | ICD-10-CM | POA: Diagnosis not present

## 2021-09-27 DIAGNOSIS — Z9181 History of falling: Secondary | ICD-10-CM | POA: Diagnosis not present

## 2021-09-27 DIAGNOSIS — G709 Myoneural disorder, unspecified: Secondary | ICD-10-CM | POA: Diagnosis not present

## 2021-09-30 DIAGNOSIS — Z87891 Personal history of nicotine dependence: Secondary | ICD-10-CM | POA: Diagnosis not present

## 2021-09-30 DIAGNOSIS — Z7952 Long term (current) use of systemic steroids: Secondary | ICD-10-CM | POA: Diagnosis not present

## 2021-09-30 DIAGNOSIS — M069 Rheumatoid arthritis, unspecified: Secondary | ICD-10-CM | POA: Diagnosis not present

## 2021-09-30 DIAGNOSIS — G709 Myoneural disorder, unspecified: Secondary | ICD-10-CM | POA: Diagnosis not present

## 2021-09-30 DIAGNOSIS — Z79620 Long term (current) use of immunosuppressive biologic: Secondary | ICD-10-CM | POA: Diagnosis not present

## 2021-09-30 DIAGNOSIS — E785 Hyperlipidemia, unspecified: Secondary | ICD-10-CM | POA: Diagnosis not present

## 2021-09-30 DIAGNOSIS — Z9181 History of falling: Secondary | ICD-10-CM | POA: Diagnosis not present

## 2021-09-30 DIAGNOSIS — Z79891 Long term (current) use of opiate analgesic: Secondary | ICD-10-CM | POA: Diagnosis not present

## 2021-09-30 DIAGNOSIS — M8008XD Age-related osteoporosis with current pathological fracture, vertebra(e), subsequent encounter for fracture with routine healing: Secondary | ICD-10-CM | POA: Diagnosis not present

## 2021-09-30 DIAGNOSIS — K219 Gastro-esophageal reflux disease without esophagitis: Secondary | ICD-10-CM | POA: Diagnosis not present

## 2021-09-30 DIAGNOSIS — Z79631 Long term (current) use of antimetabolite agent: Secondary | ICD-10-CM | POA: Diagnosis not present

## 2021-09-30 DIAGNOSIS — M5416 Radiculopathy, lumbar region: Secondary | ICD-10-CM | POA: Diagnosis not present

## 2021-09-30 DIAGNOSIS — G35 Multiple sclerosis: Secondary | ICD-10-CM | POA: Diagnosis not present

## 2021-09-30 DIAGNOSIS — M48061 Spinal stenosis, lumbar region without neurogenic claudication: Secondary | ICD-10-CM | POA: Diagnosis not present

## 2021-09-30 DIAGNOSIS — I10 Essential (primary) hypertension: Secondary | ICD-10-CM | POA: Diagnosis not present

## 2021-09-30 DIAGNOSIS — R339 Retention of urine, unspecified: Secondary | ICD-10-CM | POA: Diagnosis not present

## 2021-09-30 DIAGNOSIS — Z981 Arthrodesis status: Secondary | ICD-10-CM | POA: Diagnosis not present

## 2021-09-30 DIAGNOSIS — G8929 Other chronic pain: Secondary | ICD-10-CM | POA: Diagnosis not present

## 2021-10-02 DIAGNOSIS — Z87891 Personal history of nicotine dependence: Secondary | ICD-10-CM | POA: Diagnosis not present

## 2021-10-02 DIAGNOSIS — Z79620 Long term (current) use of immunosuppressive biologic: Secondary | ICD-10-CM | POA: Diagnosis not present

## 2021-10-02 DIAGNOSIS — M5416 Radiculopathy, lumbar region: Secondary | ICD-10-CM | POA: Diagnosis not present

## 2021-10-02 DIAGNOSIS — G35 Multiple sclerosis: Secondary | ICD-10-CM | POA: Diagnosis not present

## 2021-10-02 DIAGNOSIS — M48061 Spinal stenosis, lumbar region without neurogenic claudication: Secondary | ICD-10-CM | POA: Diagnosis not present

## 2021-10-02 DIAGNOSIS — K219 Gastro-esophageal reflux disease without esophagitis: Secondary | ICD-10-CM | POA: Diagnosis not present

## 2021-10-02 DIAGNOSIS — E785 Hyperlipidemia, unspecified: Secondary | ICD-10-CM | POA: Diagnosis not present

## 2021-10-02 DIAGNOSIS — G709 Myoneural disorder, unspecified: Secondary | ICD-10-CM | POA: Diagnosis not present

## 2021-10-02 DIAGNOSIS — G8929 Other chronic pain: Secondary | ICD-10-CM | POA: Diagnosis not present

## 2021-10-02 DIAGNOSIS — Z9181 History of falling: Secondary | ICD-10-CM | POA: Diagnosis not present

## 2021-10-02 DIAGNOSIS — R339 Retention of urine, unspecified: Secondary | ICD-10-CM | POA: Diagnosis not present

## 2021-10-02 DIAGNOSIS — M8008XD Age-related osteoporosis with current pathological fracture, vertebra(e), subsequent encounter for fracture with routine healing: Secondary | ICD-10-CM | POA: Diagnosis not present

## 2021-10-02 DIAGNOSIS — Z981 Arthrodesis status: Secondary | ICD-10-CM | POA: Diagnosis not present

## 2021-10-02 DIAGNOSIS — Z79891 Long term (current) use of opiate analgesic: Secondary | ICD-10-CM | POA: Diagnosis not present

## 2021-10-02 DIAGNOSIS — Z79631 Long term (current) use of antimetabolite agent: Secondary | ICD-10-CM | POA: Diagnosis not present

## 2021-10-02 DIAGNOSIS — I10 Essential (primary) hypertension: Secondary | ICD-10-CM | POA: Diagnosis not present

## 2021-10-02 DIAGNOSIS — Z7952 Long term (current) use of systemic steroids: Secondary | ICD-10-CM | POA: Diagnosis not present

## 2021-10-02 DIAGNOSIS — M069 Rheumatoid arthritis, unspecified: Secondary | ICD-10-CM | POA: Diagnosis not present

## 2021-10-03 ENCOUNTER — Ambulatory Visit: Payer: Medicare Other | Admitting: Rheumatology

## 2021-10-03 DIAGNOSIS — R5383 Other fatigue: Secondary | ICD-10-CM

## 2021-10-03 DIAGNOSIS — G35 Multiple sclerosis: Secondary | ICD-10-CM

## 2021-10-03 DIAGNOSIS — M810 Age-related osteoporosis without current pathological fracture: Secondary | ICD-10-CM

## 2021-10-03 DIAGNOSIS — M4312 Spondylolisthesis, cervical region: Secondary | ICD-10-CM

## 2021-10-03 DIAGNOSIS — M5136 Other intervertebral disc degeneration, lumbar region: Secondary | ICD-10-CM

## 2021-10-03 DIAGNOSIS — R0781 Pleurodynia: Secondary | ICD-10-CM

## 2021-10-03 DIAGNOSIS — Z8639 Personal history of other endocrine, nutritional and metabolic disease: Secondary | ICD-10-CM

## 2021-10-03 DIAGNOSIS — S62101D Fracture of unspecified carpal bone, right wrist, subsequent encounter for fracture with routine healing: Secondary | ICD-10-CM

## 2021-10-03 DIAGNOSIS — Z8659 Personal history of other mental and behavioral disorders: Secondary | ICD-10-CM

## 2021-10-03 DIAGNOSIS — E559 Vitamin D deficiency, unspecified: Secondary | ICD-10-CM

## 2021-10-03 DIAGNOSIS — Z96642 Presence of left artificial hip joint: Secondary | ICD-10-CM

## 2021-10-03 DIAGNOSIS — Z8679 Personal history of other diseases of the circulatory system: Secondary | ICD-10-CM

## 2021-10-03 DIAGNOSIS — F5101 Primary insomnia: Secondary | ICD-10-CM

## 2021-10-03 DIAGNOSIS — M1611 Unilateral primary osteoarthritis, right hip: Secondary | ICD-10-CM

## 2021-10-03 DIAGNOSIS — M0579 Rheumatoid arthritis with rheumatoid factor of multiple sites without organ or systems involvement: Secondary | ICD-10-CM

## 2021-10-03 DIAGNOSIS — Z79899 Other long term (current) drug therapy: Secondary | ICD-10-CM

## 2021-10-04 ENCOUNTER — Telehealth: Payer: Medicare Other

## 2021-10-10 DIAGNOSIS — G35 Multiple sclerosis: Secondary | ICD-10-CM | POA: Diagnosis not present

## 2021-10-10 DIAGNOSIS — Z79891 Long term (current) use of opiate analgesic: Secondary | ICD-10-CM | POA: Diagnosis not present

## 2021-10-10 DIAGNOSIS — G8929 Other chronic pain: Secondary | ICD-10-CM | POA: Diagnosis not present

## 2021-10-10 DIAGNOSIS — I10 Essential (primary) hypertension: Secondary | ICD-10-CM | POA: Diagnosis not present

## 2021-10-10 DIAGNOSIS — K219 Gastro-esophageal reflux disease without esophagitis: Secondary | ICD-10-CM | POA: Diagnosis not present

## 2021-10-10 DIAGNOSIS — M5416 Radiculopathy, lumbar region: Secondary | ICD-10-CM | POA: Diagnosis not present

## 2021-10-10 DIAGNOSIS — E785 Hyperlipidemia, unspecified: Secondary | ICD-10-CM | POA: Diagnosis not present

## 2021-10-10 DIAGNOSIS — M8008XD Age-related osteoporosis with current pathological fracture, vertebra(e), subsequent encounter for fracture with routine healing: Secondary | ICD-10-CM | POA: Diagnosis not present

## 2021-10-10 DIAGNOSIS — Z79631 Long term (current) use of antimetabolite agent: Secondary | ICD-10-CM | POA: Diagnosis not present

## 2021-10-10 DIAGNOSIS — M48061 Spinal stenosis, lumbar region without neurogenic claudication: Secondary | ICD-10-CM | POA: Diagnosis not present

## 2021-10-10 DIAGNOSIS — Z79620 Long term (current) use of immunosuppressive biologic: Secondary | ICD-10-CM | POA: Diagnosis not present

## 2021-10-10 DIAGNOSIS — Z981 Arthrodesis status: Secondary | ICD-10-CM | POA: Diagnosis not present

## 2021-10-10 DIAGNOSIS — G709 Myoneural disorder, unspecified: Secondary | ICD-10-CM | POA: Diagnosis not present

## 2021-10-10 DIAGNOSIS — Z7952 Long term (current) use of systemic steroids: Secondary | ICD-10-CM | POA: Diagnosis not present

## 2021-10-10 DIAGNOSIS — Z9181 History of falling: Secondary | ICD-10-CM | POA: Diagnosis not present

## 2021-10-10 DIAGNOSIS — R339 Retention of urine, unspecified: Secondary | ICD-10-CM | POA: Diagnosis not present

## 2021-10-10 DIAGNOSIS — M069 Rheumatoid arthritis, unspecified: Secondary | ICD-10-CM | POA: Diagnosis not present

## 2021-10-10 DIAGNOSIS — Z87891 Personal history of nicotine dependence: Secondary | ICD-10-CM | POA: Diagnosis not present

## 2021-10-14 DIAGNOSIS — M5416 Radiculopathy, lumbar region: Secondary | ICD-10-CM | POA: Diagnosis not present

## 2021-10-14 DIAGNOSIS — Z9181 History of falling: Secondary | ICD-10-CM | POA: Diagnosis not present

## 2021-10-14 DIAGNOSIS — E785 Hyperlipidemia, unspecified: Secondary | ICD-10-CM | POA: Diagnosis not present

## 2021-10-14 DIAGNOSIS — K219 Gastro-esophageal reflux disease without esophagitis: Secondary | ICD-10-CM | POA: Diagnosis not present

## 2021-10-14 DIAGNOSIS — G709 Myoneural disorder, unspecified: Secondary | ICD-10-CM | POA: Diagnosis not present

## 2021-10-14 DIAGNOSIS — R339 Retention of urine, unspecified: Secondary | ICD-10-CM | POA: Diagnosis not present

## 2021-10-14 DIAGNOSIS — I10 Essential (primary) hypertension: Secondary | ICD-10-CM | POA: Diagnosis not present

## 2021-10-14 DIAGNOSIS — Z7952 Long term (current) use of systemic steroids: Secondary | ICD-10-CM | POA: Diagnosis not present

## 2021-10-14 DIAGNOSIS — Z79631 Long term (current) use of antimetabolite agent: Secondary | ICD-10-CM | POA: Diagnosis not present

## 2021-10-14 DIAGNOSIS — G35 Multiple sclerosis: Secondary | ICD-10-CM | POA: Diagnosis not present

## 2021-10-14 DIAGNOSIS — M069 Rheumatoid arthritis, unspecified: Secondary | ICD-10-CM | POA: Diagnosis not present

## 2021-10-14 DIAGNOSIS — G8929 Other chronic pain: Secondary | ICD-10-CM | POA: Diagnosis not present

## 2021-10-14 DIAGNOSIS — Z79620 Long term (current) use of immunosuppressive biologic: Secondary | ICD-10-CM | POA: Diagnosis not present

## 2021-10-14 DIAGNOSIS — Z79891 Long term (current) use of opiate analgesic: Secondary | ICD-10-CM | POA: Diagnosis not present

## 2021-10-14 DIAGNOSIS — Z981 Arthrodesis status: Secondary | ICD-10-CM | POA: Diagnosis not present

## 2021-10-14 DIAGNOSIS — Z87891 Personal history of nicotine dependence: Secondary | ICD-10-CM | POA: Diagnosis not present

## 2021-10-14 DIAGNOSIS — M48061 Spinal stenosis, lumbar region without neurogenic claudication: Secondary | ICD-10-CM | POA: Diagnosis not present

## 2021-10-14 DIAGNOSIS — M8008XD Age-related osteoporosis with current pathological fracture, vertebra(e), subsequent encounter for fracture with routine healing: Secondary | ICD-10-CM | POA: Diagnosis not present

## 2021-10-16 ENCOUNTER — Ambulatory Visit: Payer: Medicare Other | Admitting: Rheumatology

## 2021-10-16 DIAGNOSIS — M546 Pain in thoracic spine: Secondary | ICD-10-CM

## 2021-10-16 DIAGNOSIS — Z79899 Other long term (current) drug therapy: Secondary | ICD-10-CM

## 2021-10-16 DIAGNOSIS — E559 Vitamin D deficiency, unspecified: Secondary | ICD-10-CM

## 2021-10-16 DIAGNOSIS — Z96642 Presence of left artificial hip joint: Secondary | ICD-10-CM

## 2021-10-16 DIAGNOSIS — G35 Multiple sclerosis: Secondary | ICD-10-CM

## 2021-10-16 DIAGNOSIS — S62101D Fracture of unspecified carpal bone, right wrist, subsequent encounter for fracture with routine healing: Secondary | ICD-10-CM

## 2021-10-16 DIAGNOSIS — R5383 Other fatigue: Secondary | ICD-10-CM

## 2021-10-16 DIAGNOSIS — M810 Age-related osteoporosis without current pathological fracture: Secondary | ICD-10-CM

## 2021-10-16 DIAGNOSIS — R0781 Pleurodynia: Secondary | ICD-10-CM

## 2021-10-16 DIAGNOSIS — Z872 Personal history of diseases of the skin and subcutaneous tissue: Secondary | ICD-10-CM

## 2021-10-16 DIAGNOSIS — F5101 Primary insomnia: Secondary | ICD-10-CM

## 2021-10-16 DIAGNOSIS — M0579 Rheumatoid arthritis with rheumatoid factor of multiple sites without organ or systems involvement: Secondary | ICD-10-CM

## 2021-10-16 DIAGNOSIS — M4312 Spondylolisthesis, cervical region: Secondary | ICD-10-CM

## 2021-10-16 DIAGNOSIS — Z5181 Encounter for therapeutic drug level monitoring: Secondary | ICD-10-CM

## 2021-10-16 DIAGNOSIS — Z8659 Personal history of other mental and behavioral disorders: Secondary | ICD-10-CM

## 2021-10-16 DIAGNOSIS — Z8679 Personal history of other diseases of the circulatory system: Secondary | ICD-10-CM

## 2021-10-16 DIAGNOSIS — Z8639 Personal history of other endocrine, nutritional and metabolic disease: Secondary | ICD-10-CM

## 2021-10-16 DIAGNOSIS — M5136 Other intervertebral disc degeneration, lumbar region: Secondary | ICD-10-CM

## 2021-10-16 DIAGNOSIS — M1611 Unilateral primary osteoarthritis, right hip: Secondary | ICD-10-CM

## 2021-10-18 DIAGNOSIS — Z7952 Long term (current) use of systemic steroids: Secondary | ICD-10-CM | POA: Diagnosis not present

## 2021-10-18 DIAGNOSIS — M8008XD Age-related osteoporosis with current pathological fracture, vertebra(e), subsequent encounter for fracture with routine healing: Secondary | ICD-10-CM | POA: Diagnosis not present

## 2021-10-18 DIAGNOSIS — Z9181 History of falling: Secondary | ICD-10-CM | POA: Diagnosis not present

## 2021-10-18 DIAGNOSIS — M5416 Radiculopathy, lumbar region: Secondary | ICD-10-CM | POA: Diagnosis not present

## 2021-10-18 DIAGNOSIS — Z79620 Long term (current) use of immunosuppressive biologic: Secondary | ICD-10-CM | POA: Diagnosis not present

## 2021-10-18 DIAGNOSIS — Z87891 Personal history of nicotine dependence: Secondary | ICD-10-CM | POA: Diagnosis not present

## 2021-10-18 DIAGNOSIS — K219 Gastro-esophageal reflux disease without esophagitis: Secondary | ICD-10-CM | POA: Diagnosis not present

## 2021-10-18 DIAGNOSIS — I10 Essential (primary) hypertension: Secondary | ICD-10-CM | POA: Diagnosis not present

## 2021-10-18 DIAGNOSIS — Z981 Arthrodesis status: Secondary | ICD-10-CM | POA: Diagnosis not present

## 2021-10-18 DIAGNOSIS — M069 Rheumatoid arthritis, unspecified: Secondary | ICD-10-CM | POA: Diagnosis not present

## 2021-10-18 DIAGNOSIS — E785 Hyperlipidemia, unspecified: Secondary | ICD-10-CM | POA: Diagnosis not present

## 2021-10-18 DIAGNOSIS — G8929 Other chronic pain: Secondary | ICD-10-CM | POA: Diagnosis not present

## 2021-10-18 DIAGNOSIS — G709 Myoneural disorder, unspecified: Secondary | ICD-10-CM | POA: Diagnosis not present

## 2021-10-18 DIAGNOSIS — Z79631 Long term (current) use of antimetabolite agent: Secondary | ICD-10-CM | POA: Diagnosis not present

## 2021-10-18 DIAGNOSIS — Z79891 Long term (current) use of opiate analgesic: Secondary | ICD-10-CM | POA: Diagnosis not present

## 2021-10-18 DIAGNOSIS — M48061 Spinal stenosis, lumbar region without neurogenic claudication: Secondary | ICD-10-CM | POA: Diagnosis not present

## 2021-10-18 DIAGNOSIS — R339 Retention of urine, unspecified: Secondary | ICD-10-CM | POA: Diagnosis not present

## 2021-10-18 DIAGNOSIS — G35 Multiple sclerosis: Secondary | ICD-10-CM | POA: Diagnosis not present

## 2021-10-22 DIAGNOSIS — G35 Multiple sclerosis: Secondary | ICD-10-CM | POA: Diagnosis not present

## 2021-10-22 DIAGNOSIS — M5416 Radiculopathy, lumbar region: Secondary | ICD-10-CM | POA: Diagnosis not present

## 2021-10-22 DIAGNOSIS — G8929 Other chronic pain: Secondary | ICD-10-CM | POA: Diagnosis not present

## 2021-10-22 DIAGNOSIS — G709 Myoneural disorder, unspecified: Secondary | ICD-10-CM | POA: Diagnosis not present

## 2021-10-22 DIAGNOSIS — R339 Retention of urine, unspecified: Secondary | ICD-10-CM | POA: Diagnosis not present

## 2021-10-22 DIAGNOSIS — I10 Essential (primary) hypertension: Secondary | ICD-10-CM | POA: Diagnosis not present

## 2021-10-22 DIAGNOSIS — M48061 Spinal stenosis, lumbar region without neurogenic claudication: Secondary | ICD-10-CM | POA: Diagnosis not present

## 2021-10-22 DIAGNOSIS — Z87891 Personal history of nicotine dependence: Secondary | ICD-10-CM | POA: Diagnosis not present

## 2021-10-22 DIAGNOSIS — M069 Rheumatoid arthritis, unspecified: Secondary | ICD-10-CM | POA: Diagnosis not present

## 2021-10-22 DIAGNOSIS — Z9181 History of falling: Secondary | ICD-10-CM | POA: Diagnosis not present

## 2021-10-22 DIAGNOSIS — Z7952 Long term (current) use of systemic steroids: Secondary | ICD-10-CM | POA: Diagnosis not present

## 2021-10-22 DIAGNOSIS — Z981 Arthrodesis status: Secondary | ICD-10-CM | POA: Diagnosis not present

## 2021-10-22 DIAGNOSIS — Z79620 Long term (current) use of immunosuppressive biologic: Secondary | ICD-10-CM | POA: Diagnosis not present

## 2021-10-22 DIAGNOSIS — Z79891 Long term (current) use of opiate analgesic: Secondary | ICD-10-CM | POA: Diagnosis not present

## 2021-10-22 DIAGNOSIS — Z79631 Long term (current) use of antimetabolite agent: Secondary | ICD-10-CM | POA: Diagnosis not present

## 2021-10-22 DIAGNOSIS — E785 Hyperlipidemia, unspecified: Secondary | ICD-10-CM | POA: Diagnosis not present

## 2021-10-22 DIAGNOSIS — M8008XD Age-related osteoporosis with current pathological fracture, vertebra(e), subsequent encounter for fracture with routine healing: Secondary | ICD-10-CM | POA: Diagnosis not present

## 2021-10-22 DIAGNOSIS — K219 Gastro-esophageal reflux disease without esophagitis: Secondary | ICD-10-CM | POA: Diagnosis not present

## 2021-10-23 DIAGNOSIS — S32049A Unspecified fracture of fourth lumbar vertebra, initial encounter for closed fracture: Secondary | ICD-10-CM | POA: Diagnosis not present

## 2021-11-07 ENCOUNTER — Telehealth: Payer: Self-pay | Admitting: Rheumatology

## 2021-11-07 NOTE — Telephone Encounter (Signed)
Patient called requesting a return call to let her know if she can pick up a sample of [name] this afternoon.

## 2021-11-07 NOTE — Telephone Encounter (Signed)
Patient stopped by the office and picked up samples.  Medication Samples have been provided to the patient.  Drug name: [name]       Strength: 15 mg        Qty: 4  LOT: [id number] and [id number] Exp.Date: 09/05/2022 and 03/08/2023  Dosing instructions: Take one tablet by mouth daily.

## 2021-11-13 ENCOUNTER — Other Ambulatory Visit: Payer: Self-pay | Admitting: Physician Assistant

## 2021-11-13 NOTE — Telephone Encounter (Signed)
Next Visit: 12/12/2021   Last Visit: 07/09/2021   Last Fill: 02/12/2021  Dx: Rheumatoid arthritis involving multiple sites with positive rheumatoid factor    Current Dose per office note on 07/09/2021: Methotrexate 0.6 ml sq injections once weekly  Labs: 09/20/2021 MCV 100.7, RDW 15.9, Glucose 108, BUN 6, Calcium 8.8  Okay to refill MTX?

## 2021-11-14 ENCOUNTER — Encounter: Payer: Self-pay | Admitting: Physician Assistant

## 2021-11-14 ENCOUNTER — Ambulatory Visit: Payer: Medicare Other | Admitting: Neurology

## 2021-11-14 ENCOUNTER — Ambulatory Visit: Payer: Medicare Other | Admitting: Physician Assistant

## 2021-11-14 DIAGNOSIS — Z1283 Encounter for screening for malignant neoplasm of skin: Secondary | ICD-10-CM | POA: Diagnosis not present

## 2021-11-20 ENCOUNTER — Ambulatory Visit: Payer: Medicare Other | Admitting: Neurology

## 2021-11-20 DIAGNOSIS — S32049A Unspecified fracture of fourth lumbar vertebra, initial encounter for closed fracture: Secondary | ICD-10-CM | POA: Diagnosis not present

## 2021-11-21 ENCOUNTER — Telehealth: Payer: Self-pay

## 2021-11-21 ENCOUNTER — Ambulatory Visit: Payer: Medicare Other | Admitting: Neurology

## 2021-11-21 NOTE — Telephone Encounter (Signed)
Pt was calling requesting a refill on: diazepam (VALIUM) 5 MG tablet Pt reached out to the pharmacy for refill and they advised her that it was to early to refill. Pt called to see if Dr. Quay Burow could ok the early refill.  I advised pt that Dr. Quay Burow was out of the office for 2 weeks and I called to confirm with the pharmacy to see when she could refill it. Pharmacy staff states that it was last refilled on 6/10 for a 30 day supply and would be able to get another refill on 12/03/21 at the earliest.  I advised the pt of that information and that she could call and speak with the pharmacy staff to see who picked it up for her if it wasn't her. I advised due to it being a control substance a DL would have been shown and scanned. Pt agreed to contact the pharmacy for that information.  FYI

## 2021-11-27 ENCOUNTER — Telehealth: Payer: Medicare Other

## 2021-11-28 ENCOUNTER — Telehealth: Payer: Medicare Other

## 2021-11-28 ENCOUNTER — Encounter: Payer: Self-pay | Admitting: Physician Assistant

## 2021-11-28 NOTE — Progress Notes (Signed)
   Follow-Up Visit   Subjective  Sherri [name] is a 81 y.o. female who presents for the following: Follow-up (Patient here today for 3 month follow up).   The following portions of the chart were reviewed this encounter and updated as appropriate:  Tobacco  Allergies  Meds  Problems  Med Hx  Surg Hx  Fam Hx      Objective  Well appearing patient in no apparent distress; mood and affect are within normal limits.  A full examination was performed including scalp, head, eyes, ears, nose, lips, neck, chest, axillae, abdomen, back, buttocks, bilateral upper extremities, bilateral lower extremities, hands, feet, fingers, toes, fingernails, and toenails. All findings within normal limits unless otherwise noted below.  No atypical nevi or signs of NMSC noted at the time of the visit.    Assessment & Plan  Encounter for screening for malignant neoplasm of skin  Yearly skin examination     I, Kien Mirsky, PA-C, have reviewed all documentation's for this visit.  The documentation on 11/28/21 for the exam, diagnosis, procedures and orders are all accurate and complete.

## 2021-11-29 NOTE — Progress Notes (Signed)
Office Visit Note  Patient: Sherri [name]             Date of Birth: 18-May-1941           MRN: [id number]             PCP: Binnie Rail, MD Referring: Binnie Rail, MD Visit Date: 12/12/2021 Occupation: '@GUAROCC'$ @  Subjective:  Increased pain  History of Present Illness: Sherri [name] is a 81 y.o. female history of seropositive rheumatoid arthritis, osteoarthritis, degenerative disc disease.  She states she underwent lumbar spine fusion in January 2023 by Dr. [name].  She states 3 days later she had to go back for another fusion.  She states that his symptoms are gradually improving.  She is still wearing a back brace.  She had to come off [name] and methotrexate for the surgery.  She states she resumed [name] in March 2023.  She could not get a refill on methotrexate due to shortage of the medication.  She has been on [name] monotherapy.  She states that she has been taking prednisone 5 mg p.o. daily.  The pain is mostly in her lower back now.  She states she has not been experiencing a lot of fatigue from surgery.  She continues to have pain and discomfort in her right wrist joint.  Activities of Daily Living:  Patient reports morning stiffness for 5 hours.   Patient Reports nocturnal pain.  Difficulty dressing/grooming: Denies Difficulty climbing stairs: Reports Difficulty getting out of chair: Reports Difficulty using hands for taps, buttons, cutlery, and/or writing: Reports  Review of Systems  Constitutional:  Positive for fatigue.  HENT:  Negative for mouth dryness.   Eyes:  Positive for dryness.  Respiratory:  Negative for shortness of breath.   Cardiovascular:  Positive for swelling in legs/feet.  Gastrointestinal:  Positive for constipation.  Endocrine: Positive for cold intolerance.  Genitourinary:  Negative for difficulty urinating.  Musculoskeletal:  Positive for joint pain, gait problem, joint pain, joint swelling, muscle weakness, morning stiffness and muscle  tenderness.  Skin:  Negative for color change, rash and sensitivity to sunlight.  Allergic/Immunologic: Negative for susceptible to infections.  Neurological:  Positive for weakness.  Hematological:  Positive for bruising/bleeding tendency. Negative for swollen glands.  Psychiatric/Behavioral:  Positive for sleep disturbance.     PMFS History:  Patient Active Problem List   Diagnosis Date Noted   Lumbar vertebral fracture, pathologic 09/20/2021   Degenerative spondylolisthesis 08/19/2021   Acute left lumbar radiculopathy 07/05/2021   Closed fracture of rib of right side with delayed healing 05/29/2021   Senile purpura ([place]) 12/19/2020   Restless leg 02/02/2020   Bilateral leg edema 01/11/2020   Cervical radiculopathy 06/28/2019   Carpal tunnel syndrome on both sides 06/28/2019   Right sided sciatica 04/14/2018   Trochanteric bursitis of right hip 04/14/2018   S/P cervical spinal fusion 11/10/2017   Dysphagia 10/26/2017   Gastroesophageal reflux disease 08/06/2017   Easy bruising 08/06/2017   Mid back pain 03/26/2017   Submandibular gland swelling 01/08/2017   High risk medication use 09/02/2016   Piriformis muscle pain 09/02/2016   Right hip pain 04/08/2016   Right knee pain 02/25/2016   Numbness 11/14/2014   Biceps tendon tear 10/10/2014   Insomnia 10/10/2014   Gait disturbance 06/13/2014   OP (osteoporosis) 01/18/2014   Other long term (current) drug therapy 01/18/2014   Hoarseness 12/29/2013   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 01/01/2013   Personal history of colonic  polyps 07/03/2010   Nocturia 05/28/2010   THYROID NODULE 05/17/2008   Multiple sclerosis ([place]) 05/17/2008   Other fatigue 03/30/2007   Hyperlipidemia 08/20/2006   Essential hypertension 08/20/2006   Rheumatoid arthritis ([place]) 08/20/2006   CERVICAL CANCER, HX OF 08/20/2006   GASTRIC ULCER, HX OF 08/20/2006    Past Medical History:  Diagnosis Date   Anxiety    takes Valium daily as needed    Basal cell carcinoma 01/21/1988   left nostril (MOHS), sup-right calf (CX35FU)   Basal cell carcinoma 03/22/1996   right calf (CX35FU)   Basal cell carcinoma 03/31/1995   left wing nose   Bruises easily    d/t meds   Cancer (HCC)    basal cell ca, in situ- uterine    Cataracts, bilateral    removed bilateral   Chronic back pain    Dizziness    r/t to meds   GERD (gastroesophageal reflux disease)    no meds on a regular basis but will take Tums if needed   Headache(784.0)    r/t neck issues   History of bronchitis 6-56yr ago   History of colon polyps    benign   HOH (hard of hearing)    wears hearing aids   Hyperlipidemia    takes Atorvastatin on Mondays and Fridays   Hypertension    Joint pain    Joint swelling    [place] ([place]    Neuromuscular disorder ([place]    Dr. RPark Liter [facility], follows [facility]   Nocturia    PONV (postoperative nausea and vomiting)    trouble urinating after surgery in 2014   Postoperative nausea and vomiting 01/11/2019   Rheumatoid arthritis ([place]    Dr VJani Gravelweekly, RA- hands- knees- feet    Rheumatoid arthritis(714.0)    Dr DEstanislado Pandytakes XMorrie Sheldondaily   Right wrist fracture    Skin cancer    Squamous cell carcinoma of skin 11/02/2001   in situ-right knee (cx33f   Squamous cell carcinoma of skin 11/12/2011   in situ-left forearm (CX35FU), in situ-left foot (CX35FU)   Squamous cell carcinoma of skin 01/22/2012   in situ-left lower forearm (txpbx)   Squamous cell carcinoma of skin 11/17/2012   right shin (txpbx)   Squamous cell carcinoma of skin 10/28/2013   in situ-right shoulder (CX35FU)   Squamous cell carcinoma of skin 04/28/2014   well diff-right shin (txpbx)   Squamous cell carcinoma of skin 11/02/2014   in situ-Left shin (txpbx)   Squamous cell carcinoma of skin 12/15/2014   in situ-Left hand (txpbx), bowens-left side chest (txpbx)   Squamous cell carcinoma of skin 05/09/2015   in situ-left  hand (txpbx)    Squamous cell carcinoma of skin 05/07/2016   in situ-left inner shin,ant, in situ-left inner shin, post, in situ-left outer forearm, in situ-right knuckle   Squamous cell carcinoma of skin 03/1302018   in situ-left shoulder (txpbx), in situ-right inner shin (txpbx), in situ-top of left foot (txpbx), KA- left forearm (txpbx)   Squamous cell carcinoma of skin 12/02/2016   in situ-left inner shin (txpbx)   Squamous cell carcinoma of skin 03/04/2017   in situ-left outer sup, shin (txpbx), in situ- right 2nd knuckle finger (txpbx), in situ- Left wrist (txpbx)   Squamous cell carcinoma of skin 04/06/2018   in situ-above left knee inner (txpbx)   Squamous cell carcinoma of skin 04/21/2018   in situ-right top hand (txpbx)   Squamous cell carcinoma of skin 07/08/2018  in situ-right lower inner shin (txpbx)   Squamous cell carcinoma of skin 04/27/2019   in situ- Left neck(CX35FU), In situ- right neck (CX35FU)   Urinary retention    sees Dr.Wrenn about 2 times a yr    Family History  Problem Relation Age of Onset   Cancer Mother        pancreatic   Diabetes Mother    Pancreatic cancer Mother    Heart disease Father        Rheumatic   Cancer Father        ? stomach   Stomach cancer Father    Cancer Sister        stomach   Stomach cancer Sister    Cancer Maternal Aunt        X 4; ? primary   Heart disease Paternal Aunt    Cancer Maternal Grandmother        cervical   Colon cancer Other        Aunts   Multiple sclerosis Daughter    Hypertension Neg Hx    Stroke Neg Hx    Esophageal cancer Neg Hx    Rectal cancer Neg Hx    Past Surgical History:  Procedure Laterality Date   ABDOMINAL HYSTERECTOMY     1985   APPENDECTOMY     with TAH   BACK SURGERY     several   BREAST BIOPSY Right    Results were negative   BROW LIFT Bilateral 10/02/2016   Procedure: BILATERAL LOWER LID BLEPHAROPLASTY;  Surgeon: Clista Bernhardt, MD;  Location: [facility];  Service: Plastics;   Laterality: Bilateral;   CARPAL TUNNEL RELEASE Right    cataracts     CERVICAL FUSION     Dr Joya Salm   CERVICAL FUSION  12/31/2012   Dr Joya Salm   CHEST TUBE INSERTION     for traumatic Pneumothorax   COLONOSCOPY  07/16/2010   normal    COLONOSCOPY W/ POLYPECTOMY  1997   negative since; Dr Deatra Ina   ESOPHAGOGASTRODUODENOSCOPY  07/16/2010   normal   eye lid raise     EYE SURGERY Bilateral    cataracts removed - /w IOL   JOINT REPLACEMENT     LUMBAR FUSION     Dr Joya Salm   NASAL SINUS SURGERY     POSTERIOR CERVICAL FUSION/FORAMINOTOMY N/A 12/31/2012   Procedure: POSTERIOR LATERAL CERVICAL FUSION/FORAMINOTOMY LEVEL 1 CERVICAL THREE-FOUR WITH LATERAL MASS SCREWS;  Surgeon: Floyce Stakes, MD;  Location: [facility];  Service: Neurosurgery;  Laterality: N/A;   PTOSIS REPAIR Bilateral 10/02/2016   Procedure: INTERNAL PTOSIS REPAIR;  Surgeon: Clista Bernhardt, MD;  Location: [facility];  Service: Plastics;  Laterality: Bilateral;   SKIN BIOPSY     THYROID SURGERY     R lobe removed- 1972, has grown back - CT last done- 2018   TOTAL HIP ARTHROPLASTY  12/22/2011   Procedure: TOTAL HIP ARTHROPLASTY;  Surgeon: Kerin Salen, MD;  Location: [facility];  Service: Orthopedics;  Laterality: Left;   TOTAL SHOULDER ARTHROPLASTY     TUBAL LIGATION     UPPER GASTROINTESTINAL ENDOSCOPY  2012   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   Social History   Social History Narrative   Not on file   Immunization History  Administered Date(s) Administered   Fluad Quad(high Dose 65+) 02/02/2020   Influenza Whole 02/22/2007, 02/08/2013   Influenza, High Dose Seasonal PF 02/04/2015, 02/18/2017, 02/21/2018, 01/16/2019, 01/25/2021   Influenza-Unspecified 01/24/2014,  01/25/2016, 02/18/2017, 01/16/2019, 01/25/2020   PFIZER(Purple Top)SARS-COV-2 Vaccination 06/18/2019, 07/09/2019, 01/09/2020, 07/03/2020, 02/07/2021   Pfizer Covid-19 Vaccine Bivalent Booster 74yr & up 02/08/2021   Pneumococcal Conjugate-13 11/23/2012,  09/08/2014   Pneumococcal Polysaccharide-23 05/26/2006   Pneumococcal-Unspecified 12/28/2012, 06/09/2020   Td 05/28/2010   Tdap 12/24/2020, 01/07/2021   Unspecified SARS-COV-2 Vaccination 06/26/2020   Varicella 11/07/2020   Zoster Recombinat (Shingrix) 02/10/2019, 04/14/2019   Zoster, Live 05/26/2009, 07/10/2020     Objective: Vital Signs: BP 136/71 (BP Location: Left Arm, Patient Position: Sitting, Cuff Size: Normal)   Pulse 83   Resp 17   Ht '5\' 1"'$  (1.549 m)   Wt 105 lb (47.6 kg)   BMI 19.84 kg/m    Physical Exam Vitals and nursing note reviewed.  Constitutional:      Appearance: She is well-developed.  HENT:     Head: Normocephalic and atraumatic.  Eyes:     Conjunctiva/sclera: Conjunctivae normal.  Cardiovascular:     Rate and Rhythm: Normal rate and regular rhythm.     Heart sounds: Normal heart sounds.  Pulmonary:     Effort: Pulmonary effort is normal.     Breath sounds: Normal breath sounds.  Abdominal:     General: Bowel sounds are normal.     Palpations: Abdomen is soft.  Musculoskeletal:     Cervical back: Normal range of motion.  Lymphadenopathy:     Cervical: No cervical adenopathy.  Skin:    General: Skin is warm and dry.     Capillary Refill: Capillary refill takes less than 2 seconds.  Neurological:     Mental Status: She is alert and oriented to person, place, and time.  Psychiatric:        Behavior: Behavior normal.      Musculoskeletal Exam: She had limited lateral rotation of the cervical spine.  She was in a back brace due to recent lumbar spine surgery.  Shoulder joints and elbow joints in good range of motion.  She had tenderness over right wrist joint without synovitis.  She has synovial thickening over MCP joints.  There was PIP and DIP thickening.  Hip joints and knee joints with good range of motion.  There was no tenderness over ankles or MTPs.  CDAI Exam: CDAI Score: 1.4  Patient Global: 2 mm; Provider Global: 2 mm Swollen: 0 ;  Tender: 2  Joint Exam 12/12/2021      Right  Left  Wrist   Tender     Lumbar Spine   Tender        Investigation: No additional findings.  Imaging: No results found.  Recent Labs: Lab Results  Component Value Date   WBC 5.0 09/20/2021   HGB 13.6 09/20/2021   PLT 331 09/20/2021   NA 137 09/20/2021   K 3.6 09/20/2021   CL 102 09/20/2021   CO2 28 09/20/2021   GLUCOSE 108 (H) 09/20/2021   BUN 6 (L) 09/20/2021   CREATININE 0.72 09/20/2021   BILITOT 0.7 06/28/2021   ALKPHOS 29 (L) 04/10/2020   AST 28 06/28/2021   ALT 17 06/28/2021   PROT 7.1 06/28/2021   ALBUMIN 4.2 04/10/2020   CALCIUM 8.8 (L) 09/20/2021   GFRAA 65 09/26/2020   QFTBGOLDPLUS NEGATIVE 09/26/2020    Speciality Comments: Prior therapy includes: XMorrie Sheldon(cost), Olumiant-inadequate response, Imuran weakness, leflunomide-intolerance Prolia: 05/04/18, 11/12/18, 06/09/19, 12/07/19, 07/05/20  Procedures:  No procedures performed Allergies: Darvon [propoxyphene hcl], Demerol [meperidine], Penicillins, Imuran [azathioprine], Sulfa antibiotics, Arava [leflunomide], Lipitor [atorvastatin], and Vytorin [ezetimibe-simvastatin]  Assessment / Plan:     Visit Diagnoses: Rheumatoid arthritis involving multiple sites with positive rheumatoid factor (HCC)-patient reports increased pain and discomfort since she has been off methotrexate.  She has been off subcutaneous methotrexate due to shortage of the medication.  She also came off [name] and methotrexate in January for the lumbar spine surgery.  She resumed [name] in March.  We will send a prescription for oral methotrexate 2.5 mg tablet, 6 tablets p.o. weekly.  I also advised her to reduce folic acid to 1 mg p.o. daily.  She will continue prednisone 5 mg p.o. daily.  Prescription refill for prednisone was sent per patient's request.  She is unable to taper prednisone.  She understands the long-term side effects of the prednisone including the risk of hypotension, diabetes,  osteoporosis and hyperlipidemia.  High risk medication use - [name] 15 mg 1 tablet by mouth daily, (methotrexate 0.6 ml sq injections once weekly-discontinued in January due to surgery and later because of the shortage.), folic acid 1 mg p.o, prednisone 5 mg 1 tablet by mouth daily. - Plan: CBC with Differential/Platelet, COMPLETE METABOLIC PANEL WITH GFR, QuantiFERON-TB Gold Plus  Closed fracture of right wrist with routine healing, subsequent encounter  Primary osteoarthritis of right hip-she had limited range of motion of her right hip joint.  History of total hip replacement, left - Dr. Mayer Camel.  She had good range of motion without discomfort.  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-she has limited painful range of motion of her cervical spine.  Pain in thoracic spine-she complains of chronic thoracic pain.  DDD (degenerative disc disease), lumbar-patient states that she had lumbar spine surgery in January x2 by Dr. [name].  She is in a back brace and continues to have lower back discomfort.  Age-related osteoporosis without current pathological fracture - DEXA: 01/16/2020 T-score: -4.2. Hx of frequent fractures.Previous Prolia doses: 05/04/18, 11/12/18, 06/09/19, 12/07/19, 07/05/20, 01/01/2021, 07/09/2021.  We will schedule repeat DEXA scan after August 23.  Use of calcium and vitamin D was discussed.  Vitamin D deficiency-her vitamin D is in the desirable range.  Primary insomnia - She takes Valium 5 mg at bedtime.  Other fatigue-patient reports increased fatigue since she had surgery.  History of anxiety  History of hypertension-blood pressure was normal today.  History of hyperlipidemia  History of multiple sclerosis ([place]) - She is not a good candidate for TNF inhibitors.   History of depression  Orders: Orders Placed This Encounter  Procedures   DG BONE DENSITY (DXA)   CBC with Differential/Platelet   COMPLETE METABOLIC PANEL WITH GFR   QuantiFERON-TB Gold Plus   Meds  ordered this encounter  Medications   predniSONE (DELTASONE) 5 MG tablet    Sig: 1 tablet by mouth every morning    Dispense:  90 tablet    Refill:  1   folic acid (FOLVITE) 1 MG tablet    Sig: Take 1 tablet (1 mg total) by mouth daily.    Dispense:  90 tablet    Refill:  3   methotrexate (RHEUMATREX) 2.5 MG tablet    Sig: Take 6 tablets (15 mg total) by mouth once a week. Caution:Chemotherapy. Protect from light.    Dispense:  72 tablet    Refill:  0     Follow-Up Instructions: Return in about 5 months (around 05/14/2022) for Rheumatoid arthritis, Osteoarthritis, Osteoporosis.   Bo Merino, MD  Note - This record has been created using Editor, commissioning.  Chart creation errors have been  sought, but may not always  have been located. Such creation errors do not reflect on  the standard of medical care.

## 2021-12-01 ENCOUNTER — Other Ambulatory Visit: Payer: Self-pay | Admitting: Physician Assistant

## 2021-12-02 NOTE — Telephone Encounter (Signed)
Next Visit: 12/12/2021   Last Visit: 07/09/2021   Last Fill: 08/08/2021  Dx: Rheumatoid arthritis involving multiple sites with positive rheumatoid factor    Current Dose per office note on 07/09/2021: prednisone 5 mg 1 tablet by mouth daily  Okay to refill Prednisone?

## 2021-12-10 ENCOUNTER — Other Ambulatory Visit: Payer: Self-pay | Admitting: Physician Assistant

## 2021-12-11 ENCOUNTER — Other Ambulatory Visit: Payer: Self-pay | Admitting: Internal Medicine

## 2021-12-12 ENCOUNTER — Ambulatory Visit: Payer: Medicare Other | Admitting: Rheumatology

## 2021-12-12 ENCOUNTER — Other Ambulatory Visit (HOSPITAL_COMMUNITY): Payer: Self-pay

## 2021-12-12 ENCOUNTER — Encounter: Payer: Self-pay | Admitting: Rheumatology

## 2021-12-12 VITALS — BP 136/71 | HR 83 | Resp 17 | Ht 61.0 in | Wt 105.0 lb

## 2021-12-12 DIAGNOSIS — E559 Vitamin D deficiency, unspecified: Secondary | ICD-10-CM

## 2021-12-12 DIAGNOSIS — S62101D Fracture of unspecified carpal bone, right wrist, subsequent encounter for fracture with routine healing: Secondary | ICD-10-CM | POA: Diagnosis not present

## 2021-12-12 DIAGNOSIS — M0579 Rheumatoid arthritis with rheumatoid factor of multiple sites without organ or systems involvement: Secondary | ICD-10-CM | POA: Diagnosis not present

## 2021-12-12 DIAGNOSIS — F5101 Primary insomnia: Secondary | ICD-10-CM | POA: Diagnosis not present

## 2021-12-12 DIAGNOSIS — M5136 Other intervertebral disc degeneration, lumbar region: Secondary | ICD-10-CM

## 2021-12-12 DIAGNOSIS — Z872 Personal history of diseases of the skin and subcutaneous tissue: Secondary | ICD-10-CM

## 2021-12-12 DIAGNOSIS — R5383 Other fatigue: Secondary | ICD-10-CM | POA: Diagnosis not present

## 2021-12-12 DIAGNOSIS — M546 Pain in thoracic spine: Secondary | ICD-10-CM | POA: Diagnosis not present

## 2021-12-12 DIAGNOSIS — R0781 Pleurodynia: Secondary | ICD-10-CM

## 2021-12-12 DIAGNOSIS — Z79899 Other long term (current) drug therapy: Secondary | ICD-10-CM

## 2021-12-12 DIAGNOSIS — M4312 Spondylolisthesis, cervical region: Secondary | ICD-10-CM | POA: Diagnosis not present

## 2021-12-12 DIAGNOSIS — G35 Multiple sclerosis: Secondary | ICD-10-CM

## 2021-12-12 DIAGNOSIS — Z96642 Presence of left artificial hip joint: Secondary | ICD-10-CM | POA: Diagnosis not present

## 2021-12-12 DIAGNOSIS — M1611 Unilateral primary osteoarthritis, right hip: Secondary | ICD-10-CM | POA: Diagnosis not present

## 2021-12-12 DIAGNOSIS — M810 Age-related osteoporosis without current pathological fracture: Secondary | ICD-10-CM | POA: Diagnosis not present

## 2021-12-12 DIAGNOSIS — Z8659 Personal history of other mental and behavioral disorders: Secondary | ICD-10-CM

## 2021-12-12 DIAGNOSIS — Z8679 Personal history of other diseases of the circulatory system: Secondary | ICD-10-CM

## 2021-12-12 DIAGNOSIS — Z8639 Personal history of other endocrine, nutritional and metabolic disease: Secondary | ICD-10-CM

## 2021-12-12 MED ORDER — METHOTREXATE 2.5 MG PO TABS: 15.0000 mg | ORAL_TABLET | ORAL | 0 refills | Status: DC

## 2021-12-12 MED ORDER — FOLIC ACID 1 MG PO TABS: 1.0000 mg | ORAL_TABLET | Freq: Every day | ORAL | 3 refills | Status: DC

## 2021-12-12 MED ORDER — PREDNISONE 5 MG PO TABS: ORAL_TABLET | ORAL | 1 refills | Status: DC

## 2021-12-12 NOTE — Patient Instructions (Signed)
Standing Labs We placed an order today for your standing lab work.   Please have your standing labs drawn in October every 3 months  If possible, please have your labs drawn 2 weeks prior to your appointment so that the provider can discuss your results at your appointment.  Please note that you may see your imaging and lab results in [place] before we have reviewed them. We may be awaiting multiple results to interpret others before contacting you. Please allow our office up to 72 hours to thoroughly review all of the results before contacting the office for clarification of your results.  We have open lab daily: Monday through Thursday from 1:30-4:30 PM and Friday from 1:30-4:00 PM at the office of Dr. Bo Merino, [facility].   Please be advised, all patients with office appointments requiring lab work will take precedent over walk-in lab work.  If possible, please come for your lab work on Monday and Friday afternoons, as you may experience shorter wait times. The office is located at [address], [place], [place], [place] [address] No appointment is necessary.   Labs are drawn by [facility]. Please bring your co-pay at the time of your lab draw.  You may receive a bill from [facility] for your lab work.  Please note if you are on Hydroxychloroquine and and an order has been placed for a Hydroxychloroquine level, you will need to have it drawn 4 hours or more after your last dose.  If you wish to have your labs drawn at another location, please call the office 24 hours in advance to send orders.  If you have any questions regarding directions or hours of operation,  please call [phone].   As a reminder, please drink plenty of water prior to coming for your lab work. Thanks!   Vaccines You are taking a medication(s) that can suppress your immune system.  The following immunizations are recommended: Flu annually Covid-19  Td/Tdap (tetanus, diphtheria, pertussis)  every 10 years Pneumonia (Prevnar 15 then Pneumovax 23 at least 1 year apart.  Alternatively, can take Prevnar 20 without needing additional dose) Shingrix: 2 doses from 4 weeks to 6 months apart  Please check with your PCP to make sure you are up to date.   If you have signs or symptoms of an infection or start antibiotics: First, call your PCP for workup of your infection. Hold your medication through the infection, until you complete your antibiotics, and until symptoms resolve if you take the following: Injectable medication (Actemra, Benlysta, Cimzia, Cosentyx, Enbrel, Humira, Kevzara, Orencia, Remicade, Simponi, Stelara, Taltz, Tremfya) Methotrexate Leflunomide (Arava) Mycophenolate (Cellcept) [name], Olumiant, or [name]

## 2021-12-13 NOTE — Progress Notes (Signed)
CBC and CMP are normal.

## 2021-12-16 LAB — COMPLETE METABOLIC PANEL WITH GFR
AG Ratio: 1.5 (calc) (ref 1.0–2.5)
ALT: 17 U/L (ref 6–29)
AST: 30 U/L (ref 10–35)
Albumin: 4.1 g/dL (ref 3.6–5.1)
Alkaline phosphatase (APISO): 39 U/L (ref 37–153)
BUN: 12 mg/dL (ref 7–25)
CO2: 28 mmol/L (ref 20–32)
Calcium: 9.5 mg/dL (ref 8.6–10.4)
Chloride: 100 mmol/L (ref 98–110)
Creat: 0.83 mg/dL (ref 0.60–1.00)
Globulin: 2.7 g/dL (calc) (ref 1.9–3.7)
Glucose, Bld: 93 mg/dL (ref 65–99)
Potassium: 4.9 mmol/L (ref 3.5–5.3)
Sodium: 137 mmol/L (ref 135–146)
Total Bilirubin: 0.4 mg/dL (ref 0.2–1.2)
Total Protein: 6.8 g/dL (ref 6.1–8.1)
eGFR: 72 mL/min/{1.73_m2} (ref >=60)

## 2021-12-16 LAB — CBC WITH DIFFERENTIAL/PLATELET
Absolute Monocytes: 468 cells/uL (ref 200–950)
Basophils Absolute: 9 cells/uL (ref 0–200)
Basophils Relative: 0.2 %
Eosinophils Absolute: 9 cells/uL — ABNORMAL LOW (ref 15–500)
Eosinophils Relative: 0.2 %
HCT: 37.9 % (ref 35.0–45.0)
Hemoglobin: 12.7 g/dL (ref 11.7–15.5)
Lymphs Abs: 1278 cells/uL (ref 850–3900)
MCH: 31.9 pg (ref 27.0–33.0)
MCHC: 33.5 g/dL (ref 32.0–36.0)
MCV: 95.2 fL (ref 80.0–100.0)
MPV: 9.4 fL (ref 7.5–12.5)
Monocytes Relative: 10.4 %
Neutro Abs: 2736 cells/uL (ref 1500–7800)
Neutrophils Relative %: 60.8 %
Platelets: 353 10*3/uL (ref 140–400)
RBC: 3.98 10*6/uL (ref 3.80–5.10)
RDW: 14.6 % (ref 11.0–15.0)
Total Lymphocyte: 28.4 %
WBC: 4.5 10*3/uL (ref 3.8–10.8)

## 2021-12-16 LAB — QUANTIFERON-TB GOLD PLUS
Mitogen-NIL: 5.54 IU/mL
NIL: 0.04 IU/mL
QuantiFERON-TB Gold Plus: NEGATIVE
TB1-NIL: 0 IU/mL
TB2-NIL: 0 IU/mL

## 2021-12-18 ENCOUNTER — Telehealth: Payer: Self-pay | Admitting: Rheumatology

## 2021-12-18 ENCOUNTER — Telehealth: Payer: Self-pay | Admitting: Pharmacist

## 2021-12-18 ENCOUNTER — Other Ambulatory Visit (HOSPITAL_COMMUNITY): Payer: Self-pay

## 2021-12-18 DIAGNOSIS — Z79899 Other long term (current) drug therapy: Secondary | ICD-10-CM

## 2021-12-18 DIAGNOSIS — M810 Age-related osteoporosis without current pathological fracture: Secondary | ICD-10-CM

## 2021-12-18 NOTE — Telephone Encounter (Signed)
Returned call to patient regarding Prolia. Phone went to VM again.  Requested return call  Knox Saliva, PharmD, MPH, BCPS, CPP Clinical Pharmacist (Rheumatology and Pulmonology)

## 2021-12-18 NOTE — Telephone Encounter (Signed)
Patient left a voicemail stating she was returning Devki's call.   °

## 2021-12-18 NOTE — Telephone Encounter (Signed)
Patient due for Prolia on 01/05/22. She had CBC and CMP completed on 12/12/21 wnl.  Copay through pharmacy benefit is $300.  ATC patient to schedule Prolia appt on 01/08/22 (I will be out of office through 01/07/22) - left VM requesting return call  Knox Saliva, PharmD, MPH, BCPS, CPP Clinical Pharmacist (Rheumatology and Pulmonology)

## 2021-12-19 ENCOUNTER — Other Ambulatory Visit (HOSPITAL_COMMUNITY): Payer: Self-pay

## 2021-12-19 MED ORDER — DENOSUMAB 60 MG/ML ~~LOC~~ SOSY
60.0000 mg | PREFILLED_SYRINGE | SUBCUTANEOUS | 0 refills | Status: DC
  Filled 2021-12-19: qty 1, 180d supply, fill #0

## 2021-12-19 NOTE — Telephone Encounter (Signed)
Returned call to pt. She is scheduled for Prolia appt on 01/09/22. Rx sent to [facility] to be couriered to clinic.  Knox Saliva, PharmD, MPH, BCPS, CPP Clinical Pharmacist (Rheumatology and Pulmonology)

## 2021-12-19 NOTE — Telephone Encounter (Signed)
Patient scheduled for Prolia appt on 01/09/22  Knox Saliva, PharmD, MPH, BCPS, CPP Clinical Pharmacist (Rheumatology and Pulmonology)

## 2021-12-23 ENCOUNTER — Other Ambulatory Visit (HOSPITAL_COMMUNITY): Payer: Self-pay

## 2021-12-23 MED ORDER — DENOSUMAB 60 MG/ML ~~LOC~~ SOSY
60.0000 mg | PREFILLED_SYRINGE | SUBCUTANEOUS | 0 refills | Status: DC
  Filled 2021-12-23: qty 1, 180d supply, fill #0

## 2021-12-23 NOTE — Telephone Encounter (Signed)
Rx for Prolia resent to [facility] with new hospital-based clinic status  Knox Saliva, PharmD, MPH, BCPS, CPP Clinical Pharmacist (Rheumatology and Pulmonology)

## 2021-12-26 DIAGNOSIS — Z681 Body mass index (BMI) 19 or less, adult: Secondary | ICD-10-CM | POA: Diagnosis not present

## 2021-12-26 DIAGNOSIS — S32049A Unspecified fracture of fourth lumbar vertebra, initial encounter for closed fracture: Secondary | ICD-10-CM | POA: Diagnosis not present

## 2021-12-26 DIAGNOSIS — M542 Cervicalgia: Secondary | ICD-10-CM | POA: Diagnosis not present

## 2021-12-27 NOTE — Progress Notes (Addendum)
Pharmacy Note  Subjective:   Patient presents to clinic today to receive bi-annual dose of Prolia. Her last dose of Prolia was  on 07/09/21  Patient running a fever or have signs/symptoms of infection? No  Patient currently on antibiotics for the treatment of infection? No  Patient had fall in the last 6 months?  Yes  If yes, did it require medical attention? No . She states she rose from bed after her surgery and fell from bed.   Patient taking calcium 1200 mg daily through diet or supplement and at least 800 units vitamin D? Yes  Objective: CMP     Component Value Date/Time   NA 137 12/12/2021 1424   NA 139 06/20/2019 1335   NA 136 02/22/2015 0000   K 4.9 12/12/2021 1424   K 4.4 02/22/2015 0000   CL 100 12/12/2021 1424   CL 98 02/22/2015 0000   CO2 28 12/12/2021 1424   CO2 30 02/22/2015 0000   GLUCOSE 93 12/12/2021 1424   BUN 12 12/12/2021 1424   BUN 10 06/20/2019 1335   CREATININE 0.83 12/12/2021 1424   CALCIUM 9.5 12/12/2021 1424   CALCIUM 9.6 02/22/2015 0000   PROT 6.8 12/12/2021 1424   PROT 6.4 06/20/2019 1335   PROT 6.6 02/22/2015 0000   ALBUMIN 4.2 04/10/2020 0914   ALBUMIN 4.2 06/20/2019 1335   ALBUMIN 4.3 02/22/2015 0000   AST 30 12/12/2021 1424   AST 24 02/22/2015 0000   ALT 17 12/12/2021 1424   ALT 18 02/22/2015 0000   ALKPHOS 29 (L) 04/10/2020 0914   ALKPHOS 34 02/22/2015 0000   BILITOT 0.4 12/12/2021 1424   BILITOT 0.3 06/20/2019 1335   GFRNONAA >60 09/20/2021 1341   GFRNONAA 56 (L) 09/26/2020 1536   GFRAA 65 09/26/2020 1536    CBC    Component Value Date/Time   WBC 4.5 12/12/2021 1424   RBC 3.98 12/12/2021 1424   HGB 12.7 12/12/2021 1424   HGB 13.0 06/20/2019 1335   HCT 37.9 12/12/2021 1424   HCT 38.0 06/20/2019 1335   PLT 353 12/12/2021 1424   PLT 363 06/20/2019 1335   MCV 95.2 12/12/2021 1424   MCV 100 (H) 06/20/2019 1335   MCH 31.9 12/12/2021 1424   MCHC 33.5 12/12/2021 1424   RDW 14.6 12/12/2021 1424   RDW 13.4 06/20/2019 1335    LYMPHSABS 1,278 12/12/2021 1424   LYMPHSABS 0.6 (L) 06/20/2019 1335   MONOABS 0.7 03/30/2021 2317   EOSABS 9 (L) 12/12/2021 1424   EOSABS 0.0 06/20/2019 1335   BASOSABS 9 12/12/2021 1424   BASOSABS 0.0 06/20/2019 1335    Lab Results  Component Value Date   VD25OH 49 06/28/2021    T-score: -4.2. DEXA: 01/16/2020   Assessment/Plan:   Patient tolerated injection without issue.   Administrations This Visit     denosumab (PROLIA) injection 60 mg     Admin Date 01/09/2022 Action Given Dose 60 mg Route Subcutaneous Administered By Cassandria Anger, RPH-CPP           An updated DEXA is scheduled for 01/22/22  Her next Prolia is due on 07/08/2022   All questions encouraged and answered.  Instructed patient to call with any further questions or concerns.  Knox Saliva, PharmD, MPH, BCPS, CPP Clinical Pharmacist (Rheumatology and Pulmonology)

## 2021-12-30 ENCOUNTER — Ambulatory Visit: Payer: Medicare Other

## 2022-01-03 ENCOUNTER — Other Ambulatory Visit (HOSPITAL_COMMUNITY): Payer: Self-pay

## 2022-01-03 ENCOUNTER — Telehealth: Payer: Self-pay | Admitting: Rheumatology

## 2022-01-03 NOTE — Telephone Encounter (Signed)
Patient states she is having pain in bilateral hands and wrists for several weeks. Patient states the right hand is worse. Patient states she is having trouble buttoning a shirt or opening doors. Nothing helps to make it better. Patient is on MTX, [name] and Prednisone 5 mg. Patient states she is taking medication as prescribed. Patient would like to know about increasing her Prednisone. Please advise.

## 2022-01-03 NOTE — Telephone Encounter (Signed)
Patient advised per Dr. Estanislado Pandy, she may increase prednisone to 10 mg p.o. daily for a week, 7.5 mg for 1 week and then stay at 5 mg p.o. daily.  Increasing prednisone dose will cause increased risk of osteoporosis, increase in blood glucose, hypertension and increased risk of heart disease. Patient expressed understanding.

## 2022-01-03 NOTE — Telephone Encounter (Signed)
She may increase prednisone to 10 mg p.o. daily for a week, 7.5 mg for 1 week and then stay at 5 mg p.o. daily.  Increasing prednisone dose will cause increased risk of osteoporosis, increase in blood glucose, hypertension and increased risk of heart disease.

## 2022-01-03 NOTE — Telephone Encounter (Signed)
Patient called the office stating both hands and wrists are in a lot of pain. Patient wants to know if she can increase her prednisone.

## 2022-01-07 ENCOUNTER — Telehealth: Payer: Self-pay | Admitting: Rheumatology

## 2022-01-07 NOTE — Telephone Encounter (Signed)
Patient called the office stating she has an appointment tomorrow to get Prolia and would like to get a sample of [name] while she is here.

## 2022-01-07 NOTE — Telephone Encounter (Signed)
Spoke with patient and advised she may pick up samples while she is in the office tomorrow. Samples reserved for patient in cabinet.

## 2022-01-08 NOTE — Telephone Encounter (Signed)
Prolia received from [facility]. Placed in refrigerator  Vrishank Moster, PharmD, MPH, BCPS, CPP Clinical Pharmacist (Rheumatology and Pulmonology) 

## 2022-01-09 ENCOUNTER — Ambulatory Visit: Payer: Medicare Other | Attending: Rheumatology | Admitting: Pharmacist

## 2022-01-09 VITALS — BP 124/74 | HR 74

## 2022-01-09 DIAGNOSIS — Z7689 Persons encountering health services in other specified circumstances: Secondary | ICD-10-CM

## 2022-01-09 DIAGNOSIS — M810 Age-related osteoporosis without current pathological fracture: Secondary | ICD-10-CM | POA: Diagnosis not present

## 2022-01-09 MED ORDER — DENOSUMAB 60 MG/ML ~~LOC~~ SOSY
60.0000 mg | PREFILLED_SYRINGE | Freq: Once | SUBCUTANEOUS | Status: AC
  Administered 2022-01-09: 60 mg via SUBCUTANEOUS

## 2022-01-13 ENCOUNTER — Other Ambulatory Visit (HOSPITAL_COMMUNITY): Payer: Self-pay

## 2022-01-14 ENCOUNTER — Telehealth: Payer: Self-pay | Admitting: *Deleted

## 2022-01-14 ENCOUNTER — Encounter: Payer: Self-pay | Admitting: *Deleted

## 2022-01-14 ENCOUNTER — Telehealth: Payer: Self-pay | Admitting: Internal Medicine

## 2022-01-14 NOTE — Patient Outreach (Signed)
  Care Coordination   01/14/2022 Name: Sherri [name] MRN: [id number] DOB: 08-15-1940   Care Coordination Outreach Attempts:  An unsuccessful telephone outreach was attempted today to offer the patient information about available care coordination services as a benefit of their health plan.   Follow Up Plan:  Additional outreach attempts will be made to offer the patient care coordination information and services.   Encounter Outcome:  No Answer left voice mail requesting call back  Care Coordination Interventions Activated:  No   Care Coordination Interventions:  No, not indicated unsuccessful outreach attempt    Oneta Rack, RN, BSN, [place] [facility] [phone]: direct office

## 2022-01-14 NOTE — Telephone Encounter (Signed)
Patient called asking if Dr. Quay Burow would recommend that she get the RSV vaccine. Call back number is [phone]

## 2022-01-15 NOTE — Telephone Encounter (Signed)
Spoke with patient today. 

## 2022-01-16 ENCOUNTER — Telehealth: Payer: Self-pay | Admitting: *Deleted

## 2022-01-16 NOTE — Telephone Encounter (Signed)
Medication Samples have been provided to the patient.  Drug name: [name]       Strength: 15 mg        Qty: 2  LOT: [id number]  Exp.Date: 03/08/2023  Dosing instructions: Take one tablet by mouth daily.

## 2022-01-17 ENCOUNTER — Encounter: Payer: Self-pay | Admitting: *Deleted

## 2022-01-17 ENCOUNTER — Ambulatory Visit: Payer: Self-pay | Admitting: *Deleted

## 2022-01-17 NOTE — Patient Outreach (Signed)
  Care Coordination   Initial Visit Note   01/17/2022 Name: Sherri [name] MRN: [id number] DOB: 16-Sep-1940  Sherri [name] is a 81 y.o. year old female who sees Burns, Claudina Lick, MD for primary care. I spoke with  Sherri [name] by phone today.  What matters to the patients health and wellness today?  "I am still wearing the brace after my back surgery earlier this year, but they are letting me take it off a little more frequently than I was in the past; I am followed regularly by Dr. [name] and by my RA doctors and things are under control and going overall well; I am happy to schedule my Medicare Annual Wellness visit, by phone; last time it was scheduled, I waited by the phone but no one ever called me"   Goals Addressed             This Visit's Progress    COMPLETED: Care Coordination Activities: No follow up required       Care Coordination Interventions: Evaluation of current treatment plan related to Rheumatoid Arthritis and patient's adherence to plan as established by provider Advised patient to provide appropriate vaccination information to provider or CM team member at next visit Advised patient to schedule Medicare Annual Wellness visit: she reports she has scheduled "several times" in the past for a telephone appointment-- states "it gets scheduled, but they never call to do the appointment; encouraged patient to also consider scheduling PCP office visit- last noted on 05/29/21- she will consider, reports is regularly followed by specialists for RA and MS, as well as with neurosurgical provider after back surgeries earlier this year  Provided education to patient re: value/ purpose of Medicare Annual Wellness Visit Collaborated with PCP office team regarding scheduling of Medicare Annual Wellness Visit- spoke with [name] to facilitate scheduling; care coordination outreach subsequently completed with NHA/ LPN and schedulers for AWV at [facility] by secure chat to facilitate  scheduling of this visit, if needed  Reviewed scheduled/upcoming provider appointments including 01/21/22- orthopedic specialist; 01/22/22- dexa bone density; 03/11/22- neurosurgery provider Assessed social determinant of health barriers Confirmed patient has RSV vaccine scheduled for 01/18/22 and flu vaccine for 02/03/22; she also reports she plans to obtain new COVID vaccine booster which her outpatient pharmacy has told her would be available as of "October;" she was encouraged to complete these vaccines as planned and verbalizes agreement with same         SDOH assessments and interventions completed:  Yes  SDOH Interventions Today    Flowsheet Row Most Recent Value  SDOH Interventions   Food Insecurity Interventions Intervention Not Indicated  Transportation Interventions Intervention Not Indicated  [drives self]       Care Coordination Interventions Activated:  Yes  Care Coordination Interventions:  Yes, provided   Follow up plan: No further intervention required. Facilitated scheduling of Medicare AWV; patient denies further/ ongoing care coordination needs today  Encounter Outcome:  Pt. Visit Completed   Oneta Rack, RN, BSN, CCRN Alumnus RN CM Care Coordination/ Transition of [place] [facility] [phone]: direct office

## 2022-01-17 NOTE — Patient Instructions (Signed)
Visit Information  Thank you for taking time to visit with me today. Please don't hesitate to contact me if I can be of assistance to you.   Following are the goals we discussed today:   Goals Addressed             This Visit's Progress    COMPLETED: Care Coordination Activities: No follow up required       Care Coordination Interventions: Evaluation of current treatment plan related to Rheumatoid Arthritis and patient's adherence to plan as established by provider Advised patient to provide appropriate vaccination information to provider or CM team member at next visit Advised patient to schedule Medicare Annual Wellness visit: she reports she has scheduled "several times" in the past for a telephone appointment-- states "it gets scheduled, but they never call to do the appointment; encouraged patient to also consider scheduling PCP office visit- last noted on 05/29/21- she will consider, reports is regularly followed by specialists for RA and MS, as well as with neurosurgical provider after back surgeries earlier this year  Provided education to patient re: value/ purpose of Medicare Annual Wellness Visit Collaborated with PCP office team regarding scheduling of Medicare Annual Wellness Visit- spoke with [name] to facilitate scheduling; care coordination outreach subsequently completed with NHA/ LPN and schedulers for AWV at [facility] by secure chat to facilitate scheduling of this visit, if needed  Reviewed scheduled/upcoming provider appointments including 01/21/22- orthopedic specialist; 01/22/22- dexa bone density; 03/11/22- neurosurgery provider Assessed social determinant of health barriers Confirmed patient has RSV vaccine scheduled for 01/18/22 and flu vaccine for 02/03/22; she also reports she plans to obtain new COVID vaccine booster which her outpatient pharmacy has told her would be available as of "October;" she was encouraged to complete these vaccines as planned and verbalizes  agreement with same         If you are experiencing a Mental Health or [place] or need someone to talk to, please  call the Suicide and Crisis Lifeline: 988 call the [place] National Suicide Prevention Lifeline: (702)059-6409 or TTY: 6698623285 TTY 401-455-9846) to talk to a trained counselor call 1-800-273-TALK (toll free, 24 hour hotline) go to [facility] [place] (458)575-7693) call the [place]: 442-026-9016 call 911   Patient verbalizes understanding of instructions and care plan provided today and agrees to view in [place]. Active MyChart status and patient understanding of how to access instructions and care plan via MyChart confirmed with patient.     No further follow up required: facilitated scheduling of Medicare AWV; patient denies further or ongoing care coordination needs  Oneta Rack, RN, BSN, CCRN Alumnus RN CM Care Coordination/ Transition of [place] [facility] [phone]: direct office

## 2022-01-20 ENCOUNTER — Other Ambulatory Visit: Payer: Self-pay | Admitting: Otolaryngology

## 2022-01-20 DIAGNOSIS — E041 Nontoxic single thyroid nodule: Secondary | ICD-10-CM

## 2022-01-20 DIAGNOSIS — H6123 Impacted cerumen, bilateral: Secondary | ICD-10-CM | POA: Diagnosis not present

## 2022-01-20 DIAGNOSIS — D440 Neoplasm of uncertain behavior of thyroid gland: Secondary | ICD-10-CM | POA: Diagnosis not present

## 2022-01-21 ENCOUNTER — Encounter: Payer: Self-pay | Admitting: Physical Therapy

## 2022-01-21 ENCOUNTER — Ambulatory Visit: Payer: Medicare Other | Admitting: Physical Therapy

## 2022-01-21 DIAGNOSIS — M542 Cervicalgia: Secondary | ICD-10-CM

## 2022-01-21 DIAGNOSIS — M6281 Muscle weakness (generalized): Secondary | ICD-10-CM | POA: Diagnosis not present

## 2022-01-21 DIAGNOSIS — M8448XA Pathological fracture, other site, initial encounter for fracture: Secondary | ICD-10-CM | POA: Diagnosis not present

## 2022-01-21 NOTE — Therapy (Signed)
OUTPATIENT PHYSICAL THERAPY THORACOLUMBAR EVALUATION   Patient Name: Sherri [name] MRN: [id number] DOB:Jul 24, 1940, 81 y.o., female Today's Date: 01/21/2022   PT End of Session - 01/21/22 1302     Visit Number 1    Number of Visits 20    Date for PT Re-Evaluation 04/01/22    Progress Note Due on Visit 10    PT Start Time 1304    PT Stop Time 1344    PT Time Calculation (min) 40 min    Equipment Utilized During Treatment Back brace    Activity Tolerance Patient tolerated treatment well    Behavior During Therapy WFL for tasks assessed/performed             Past Medical History:  Diagnosis Date   Anxiety    takes Valium daily as needed   Basal cell carcinoma 01/21/1988   left nostril (MOHS), sup-right calf (CX35FU)   Basal cell carcinoma 03/22/1996   right calf (CX35FU)   Basal cell carcinoma 03/31/1995   left wing nose   Bruises easily    d/t meds   Cancer (HCC)    basal cell ca, in situ- uterine    Cataracts, bilateral    removed bilateral   Chronic back pain    Dizziness    r/t to meds   GERD (gastroesophageal reflux disease)    no meds on a regular basis but will take Tums if needed   Headache(784.0)    r/t neck issues   History of bronchitis 6-71yr ago   History of colon polyps    benign   HOH (hard of hearing)    wears hearing aids   Hyperlipidemia    takes Atorvastatin on Mondays and Fridays   Hypertension    Joint pain    Joint swelling    Multiple sclerosis ([place]    Neuromuscular disorder ([place]    Dr. RPark Liter [facility], follows M.S.   Nocturia    PONV (postoperative nausea and vomiting)    trouble urinating after surgery in 2014   Postoperative nausea and vomiting 01/11/2019   Rheumatoid arthritis ([place]    Dr VJani Gravelweekly, RA- hands- knees- feet    Rheumatoid arthritis(714.0)    Dr DEstanislado Pandytakes XMorrie Sheldondaily   Right wrist fracture    Skin cancer    Squamous cell carcinoma of skin 11/02/2001   in situ-right  knee (cx365f   Squamous cell carcinoma of skin 11/12/2011   in situ-left forearm (CX35FU), in situ-left foot (CX35FU)   Squamous cell carcinoma of skin 01/22/2012   in situ-left lower forearm (txpbx)   Squamous cell carcinoma of skin 11/17/2012   right shin (txpbx)   Squamous cell carcinoma of skin 10/28/2013   in situ-right shoulder (CX35FU)   Squamous cell carcinoma of skin 04/28/2014   well diff-right shin (txpbx)   Squamous cell carcinoma of skin 11/02/2014   in situ-Left shin (txpbx)   Squamous cell carcinoma of skin 12/15/2014   in situ-Left hand (txpbx), bowens-left side chest (txpbx)   Squamous cell carcinoma of skin 05/09/2015   in situ-left hand (txpbx)    Squamous cell carcinoma of skin 05/07/2016   in situ-left inner shin,ant, in situ-left inner shin, post, in situ-left outer forearm, in situ-right knuckle   Squamous cell carcinoma of skin 03/1302018   in situ-left shoulder (txpbx), in situ-right inner shin (txpbx), in situ-top of left foot (txpbx), KA- left forearm (txpbx)   Squamous cell carcinoma of skin 12/02/2016   in situ-left inner  shin (txpbx)   Squamous cell carcinoma of skin 03/04/2017   in situ-left outer sup, shin (txpbx), in situ- right 2nd knuckle finger (txpbx), in situ- Left wrist (txpbx)   Squamous cell carcinoma of skin 04/06/2018   in situ-above left knee inner (txpbx)   Squamous cell carcinoma of skin 04/21/2018   in situ-right top hand (txpbx)   Squamous cell carcinoma of skin 07/08/2018   in situ-right lower inner shin (txpbx)   Squamous cell carcinoma of skin 04/27/2019   in situ- Left neck(CX35FU), In situ- right neck (CX35FU)   Urinary retention    sees Dr.Wrenn about 2 times a yr   Past Surgical History:  Procedure Laterality Date   [place]     with TAH   BACK SURGERY     several   BREAST BIOPSY Right    Results were negative   BROW LIFT Bilateral 10/02/2016   Procedure: BILATERAL LOWER LID  BLEPHAROPLASTY;  Surgeon: Clista Bernhardt, MD;  Location: [place];  Service: Plastics;  Laterality: Bilateral;   CARPAL TUNNEL RELEASE Right    cataracts     CERVICAL FUSION     Dr Joya Salm   CERVICAL FUSION  12/31/2012   Dr Joya Salm   CHEST TUBE INSERTION     for traumatic Pneumothorax   COLONOSCOPY  07/16/2010   normal    COLONOSCOPY W/ POLYPECTOMY  1997   negative since; Dr Deatra Ina   ESOPHAGOGASTRODUODENOSCOPY  07/16/2010   normal   eye lid raise     EYE SURGERY Bilateral    cataracts removed - /w IOL   JOINT REPLACEMENT     LUMBAR FUSION     Dr Joya Salm   NASAL SINUS SURGERY     POSTERIOR CERVICAL FUSION/FORAMINOTOMY N/A 12/31/2012   Procedure: POSTERIOR LATERAL CERVICAL FUSION/FORAMINOTOMY LEVEL 1 CERVICAL THREE-FOUR WITH LATERAL MASS SCREWS;  Surgeon: Floyce Stakes, MD;  Location: [facility];  Service: Neurosurgery;  Laterality: N/A;   PTOSIS REPAIR Bilateral 10/02/2016   Procedure: INTERNAL PTOSIS REPAIR;  Surgeon: Clista Bernhardt, MD;  Location: [place];  Service: Plastics;  Laterality: Bilateral;   SKIN BIOPSY     THYROID SURGERY     R lobe removed- 1972, has grown back - CT last done- 2018   TOTAL HIP ARTHROPLASTY  12/22/2011   Procedure: TOTAL HIP ARTHROPLASTY;  Surgeon: Kerin Salen, MD;  Location: [place];  Service: Orthopedics;  Laterality: Left;   TOTAL SHOULDER ARTHROPLASTY     TUBAL LIGATION     UPPER GASTROINTESTINAL ENDOSCOPY  2012   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   Patient Active Problem List   Diagnosis Date Noted   Lumbar vertebral fracture, pathologic 09/20/2021   Degenerative spondylolisthesis 08/19/2021   Acute left lumbar radiculopathy 07/05/2021   Closed fracture of rib of right side with delayed healing 05/29/2021   Senile purpura ([place]) 12/19/2020   Restless leg 02/02/2020   Bilateral leg edema 01/11/2020   Cervical radiculopathy 06/28/2019   Carpal tunnel syndrome on both sides 06/28/2019   Right sided sciatica 04/14/2018   Trochanteric  bursitis of right hip 04/14/2018   S/P cervical spinal fusion 11/10/2017   Dysphagia 10/26/2017   Gastroesophageal reflux disease 08/06/2017   Easy bruising 08/06/2017   Mid back pain 03/26/2017   Submandibular gland swelling 01/08/2017   High risk medication use 09/02/2016   Piriformis muscle pain 09/02/2016   Right hip pain 04/08/2016   Right  knee pain 02/25/2016   Numbness 11/14/2014   Biceps tendon tear 10/10/2014   Insomnia 10/10/2014   Gait disturbance 06/13/2014   OP (osteoporosis) 01/18/2014   Other long term (current) drug therapy 01/18/2014   Hoarseness 12/29/2013   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 01/01/2013   Personal history of colonic polyps 07/03/2010   Nocturia 05/28/2010   THYROID NODULE 05/17/2008   Multiple sclerosis ([place]) 05/17/2008   Other fatigue 03/30/2007   Hyperlipidemia 08/20/2006   Essential hypertension 08/20/2006   Rheumatoid arthritis ([place]) 08/20/2006   CERVICAL CANCER, HX OF 08/20/2006   GASTRIC ULCER, HX OF 08/20/2006    PCP: Binnie Rail MD  REFERRING PROVIDER: Viona Gilmore, NP  REFERRING DIAG: 4th lumbar vertebrae fracture, neck pain  Rationale for Evaluation and Treatment Rehabilitation  THERAPY DIAG:  Pathological fracture of lumbar vertebra, initial encounter - Plan: PT plan of care cert/re-cert  Cervicalgia - Plan: PT plan of care cert/re-cert  Muscle weakness (generalized) - Plan: PT plan of care cert/re-cert  ONSET DATE:  DOS 08/19/21 - first surgery-  L3-L4 degenerative spondylolisthesis with stenosis and radiculopathy, status post L4-S1 decompression and fusion with instrumentation  DOS (revision) 09/20/21: Osteoporosis related pathologic fracture of L4.  Patient status post recent L3-4 and remote L4-S1 decompression and fusion surgery. Reexploration of L3-S1 fusion with decompressive laminectomy and bilateral transpedicular decompressions at L4 - MD Cooper Render.Pool, M.D  SUBJECTIVE:                                                                                                                                                                                            SUBJECTIVE STATEMENT: States that she had back surgery for the first time with hardware placed in her back over 10 years ago. States her MD went in to fix original surgery earlier this year and was fine until about a week after the surgery where she started having extreme radicular symptoms. Imaging demonstrated a compression fracture at L4. MD performed second surgery with larger fusion to help with fixation and has been in a lumbar brace since the surgery. As of last week she was allowed to come out of the brace to walk around the house and when she lays/sits down to rest.  Patient is primarily concerned with weakness in her legs. Her balance is off but it is off because of her MS. States she can currently stand for 5 minutes at a time    PERTINENT HISTORY:  MS, RA, hx of skin cancer, L THA, posterior cervical fusion C3-4 2014, osteoporosis   PAIN:  Are you having pain? Yes: NPRS scale: 3/10 Pain location:  low back Pain description: ache Aggravating factors: too active  Relieving factors: heat   PRECAUTIONS: back precautions -no BLTs, 5# lifting limit, brace when out of house  WEIGHT BEARING RESTRICTIONS No  FALLS:  Has patient fallen in last 6 months? Yes. Number of falls 1 right after her first surgery  LIVING ENVIRONMENT: Lives with: lives with their spouse Lives in: House/apartment Stairs: Yes: Internal: 12 steps; on right going up Has following equipment at home: Single point cane, Walker - 2 wheeled, bed side commode, Grab bars, and Ramped entry  OCCUPATION: not working  PLOF:  needs help with cooking/cleaning but can bathe herself  PATIENT GOALS to increase her leg strength, to improve ability to stand for longer periods of time   OBJECTIVE:   DIAGNOSTIC FINDINGS:  CT lumbar spine 09/12/21 FINDINGS: Segmentation: 5  lumbar type vertebrae.   Alignment: Approximately 6 mm anterolisthesis at L3-L4.   Vertebrae: Osseous demineralization. Postoperative changes are present with rods and pedicle screws at L3-L4. There is subsidence of a left-sided interbody spacer into L3. Subsidence right sided interbody spacer into fractured L4 vertebral body. Rods and pedicle screws are also present at L5-S1. Interbody spacers are present at L4-L5 and L5-S1. Laminectomies are present at the operative levels. Streak artifact associated with the hardware.     IMPRESSION: Known L4 fracture. Postoperative changes as described with intact hardware. There is subsidence of the L3-L4 interbody spacers into L3 and L4. Canal is not well evaluated at the fracture and operative levels due to artifact. There is probable bilateral foraminal narrowing at L4-L5 secondary to distortion from the fracture.    SCREENING FOR RED FLAGS: Bowel or bladder incontinence: No Spinal tumors: No Cauda equina syndrome: No Compression fracture: Yes: from 4/23 Abdominal aneurysm: No  COGNITION:  Overall cognitive status: Within functional limits for tasks assessed     SENSATION: WFL  MUSCLE LENGTH: Hamstrings: Right 0 deg; Left 20 deg   POSTURE: rounded shoulders, forward head, posterior pelvic tilt, and flexed trunk     LUMBAR ROM: - not assessed       LE Measurements Lower Extremity Right 01/21/2022 Left 01/21/2022   A/PROM MMT A/PROM MMT  Hip Flexion 120 4 120 4-  Hip Extension      Hip Abduction      Hip Adduction      Hip Internal rotation 45  55   Hip External rotation 45  45   Knee Flexion  4-  3+  Knee Extension  4  4-  Ankle Dorsiflexion  4  4-  Ankle Plantarflexion      Ankle Inversion      Ankle Eversion       (Blank rows = not tested)  * pain   LUMBAR SPECIAL TESTS:  Slump test - neg  FUNCTIONAL TESTS:  STS - can get out of chair without UE support but favors R LE Standing - leans to the  right   Unable to stand on either leg in SLS or in tandem (patient reports this is 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) GAIT: Distance walked: 50 feet Assistive device utilized: None Level of assistance: Complete Independence Comments: with lumbar brace on- stiff, limited hip extension, leans to the right    TODAY'S TREATMENT  01/21/2022 Therapeutic Exercise:  Aerobic: Supine: hip add iso x10 5" holds, hip abd iso holds red band x10 5" holds, bent knee fall outs 1.5 minutes Prone:  Seated:  Standing: Neuromuscular Re-education: Manual Therapy: Therapeutic Activity: Self Care: Trigger Point  Dry Needling:  Modalities:      PATIENT EDUCATION:  Education details: on current presentation, on HEP, on clinical outcomes score and POC Person educated: Patient Education method: Explanation, Demonstration, and Handouts Education comprehension: verbalized understanding    HOME EXERCISE PROGRAM: [id number]  ASSESSMENT:  CLINICAL IMPRESSION: Patient presents to therapy with history of 3 lumbar surgeries. Initially surgery from years ago failed and MD was repairing the original fixation on 08/19/21. Unfortunately, with patient's osteoporosis, the fusion did not take and a third surgery on 09/20/21 was warranted to fixate a greater area. Patient has been in a lumbar brace since the first surgery and has only just been allowed to reduce the time she spends in it at home. Patient presents with weakness and would greatly benefit from skilled PT to improve overall function and QOL.   OBJECTIVE IMPAIRMENTS Abnormal gait, decreased activity tolerance, decreased balance, decreased knowledge of use of DME, decreased mobility, difficulty walking, decreased ROM, decreased strength, improper body mechanics, postural dysfunction, and pain.   ACTIVITY LIMITATIONS carrying, lifting, bending, sitting, standing, squatting, stairs, bed mobility, and locomotion level  PARTICIPATION LIMITATIONS: meal prep,  cleaning, and community activity  PERSONAL FACTORS Age, Fitness, and 3+ comorbidities: MS, x4 lx surgeries, osteoporosis  are also affecting patient's functional outcome.   REHAB POTENTIAL: Fair given patients history  CLINICAL DECISION MAKING: Evolving/moderate complexity  EVALUATION COMPLEXITY: Moderate   GOALS: Goals reviewed with patient?  yes  SHORT TERM GOALS:  Patient will be independent in self management strategies to improve quality of life and functional outcomes. Baseline: new program Target date: 02/25/2022 Goal status: INITIAL  2.  Patient will report at least 50% improvement in overall symptoms and/or function to demonstrate improved functional mobility Baseline: 0% Target date: 02/25/2022 Goal status: INITIAL  3.  Patient will be able to stand for at least 15 minutes with brace to demonstrate improved standing endurance Baseline: 5 minutes Target date: 02/25/2022 Goal status: INITIAL       LONG TERM GOALS:  Patient will report at least 75% improvement in overall symptoms and/or function to demonstrate improved functional mobility Baseline: 0% Target date: 04/01/2022 Goal status: INITIAL  2.  Patient will be able to demonstrate at least 4/5 MMT in bilateral legs to demonstrate improved strength. Baseline: see above Target date: 04/01/2022 Goal status: INITIAL  3.  Patient will be able to stand for at least 25 minutes at a time to improve ability to cook meals Baseline: 5 minutes Target date: 04/01/2022 Goal status: INITIAL      PLAN: PT FREQUENCY: 2x/week  PT DURATION: 10 weeks  PLANNED INTERVENTIONS: Therapeutic exercises, Therapeutic activity, Neuromuscular re-education, Balance training, Gait training, Patient/Family education, Self Care, Joint mobilization, Joint manipulation, Orthotic/Fit training, Aquatic Therapy, Dry Needling, Cognitive remediation, Electrical stimulation, Cryotherapy, Moist heat, Ultrasound, Ionotophoresis '4mg'$ /ml  Dexamethasone, and Manual therapy.  PLAN FOR NEXT SESSION: isometrics, core activation, standing exercises/endurance, LE strengthening. Strict no BLTs and 5# lifting restriction  3:47 PM, 01/21/22 [name], DPT Physical Therapy with [facility]

## 2022-01-22 ENCOUNTER — Ambulatory Visit (HOSPITAL_BASED_OUTPATIENT_CLINIC_OR_DEPARTMENT_OTHER)
Admission: RE | Admit: 2022-01-22 | Discharge: 2022-01-22 | Disposition: A | Discharge status: Home / Self Care | Payer: Medicare Other | Source: Ambulatory Visit | Attending: Rheumatology | Admitting: Rheumatology

## 2022-01-22 ENCOUNTER — Encounter (HOSPITAL_BASED_OUTPATIENT_CLINIC_OR_DEPARTMENT_OTHER): Payer: Self-pay

## 2022-01-22 DIAGNOSIS — S62101D Fracture of unspecified carpal bone, right wrist, subsequent encounter for fracture with routine healing: Secondary | ICD-10-CM

## 2022-01-22 DIAGNOSIS — M810 Age-related osteoporosis without current pathological fracture: Secondary | ICD-10-CM

## 2022-01-22 DIAGNOSIS — E559 Vitamin D deficiency, unspecified: Secondary | ICD-10-CM

## 2022-01-23 ENCOUNTER — Ambulatory Visit
Admission: RE | Admit: 2022-01-23 | Discharge: 2022-01-23 | Disposition: A | Discharge status: Home / Self Care | Payer: Medicare Other | Source: Ambulatory Visit | Attending: Otolaryngology | Admitting: Otolaryngology

## 2022-01-23 ENCOUNTER — Encounter: Payer: Self-pay | Admitting: Internal Medicine

## 2022-01-23 ENCOUNTER — Telehealth: Payer: Self-pay | Admitting: Rheumatology

## 2022-01-23 DIAGNOSIS — E041 Nontoxic single thyroid nodule: Secondary | ICD-10-CM | POA: Diagnosis not present

## 2022-01-23 NOTE — Telephone Encounter (Signed)
Patient called stating Dr. Estanislado Pandy referred her to [facility] at [facility] for her bone density scan which was scheduled for yesterday, 01/22/22.  Patient states when she checked in she was told if you change locations and the scan doesn't show improvement her insurance won't pay for it and she would be billed $2,500.  Patient states Dr. Arlean Hopping nurse called and told her she needed to change locations from Dr. Delanna Ahmadi office to [facility].  Patient states she didn't have the scan and requested a return call.

## 2022-01-23 NOTE — Telephone Encounter (Signed)
I called patient, DEXA order faxed to [facility], pending appt

## 2022-01-30 NOTE — Therapy (Unsigned)
OUTPATIENT PHYSICAL THERAPY TREATMENT NOTE   Patient Name: Sherri SUNDE MRN: [id number] DOB:May 28, 1940, 81 y.o., female 72 Date: 01/30/2022  PCP: Binnie Rail MD REFERRING PROVIDER:  Viona Gilmore, NP  END OF SESSION:    Past Medical History:  Diagnosis Date   Anxiety    takes Valium daily as needed   Basal cell carcinoma 01/21/1988   left nostril (MOHS), sup-right calf (CX35FU)   Basal cell carcinoma 03/22/1996   right calf (CX35FU)   Basal cell carcinoma 03/31/1995   left wing nose   Bruises easily    d/t meds   Cancer ([place])    basal cell ca, in situ- uterine    Cataracts, bilateral    removed bilateral   Chronic back pain    Dizziness    r/t to meds   GERD (gastroesophageal reflux disease)    no meds on a regular basis but will take Tums if needed   Headache(784.0)    r/t neck issues   History of bronchitis 6-38yr ago   History of colon polyps    benign   HOH (hard of hearing)    wears hearing aids   Hyperlipidemia    takes Atorvastatin on Mondays and Fridays   Hypertension    Joint pain    Joint swelling    Multiple sclerosis ([place]    Ne[place]romuscular disorder ([place]    Dr. RPark Liter [facility], follows [facility]   Nocturia    PONV (postoperative nausea and vomiting)    trouble urinating after surgery in 2014   Postoperative nausea and vomiting 01/11/2019   Rheumatoid arthritis ([place]    Dr VJani Gravelweekly, RA- hands- knees- feet    Rheumatoid arthritis(714.0)    Dr DEstanislado Pandytakes XMorrie Sheldondaily   Right wrist fracture    Skin cancer    Squamous cell carcinoma of skin 11/02/2001   in situ-right knee (cx358f   Squamous cell carcinoma of skin 11/12/2011   in situ-left forearm (CX35FU), in situ-left foot (CX35FU)   Squamous cell carcinoma of skin 01/22/2012   in situ-left lower forearm (txpbx)   Squamous cell carcinoma of skin 11/17/2012   right shin (txpbx)   Squamous cell carcinoma of skin 10/28/2013   in situ-right shoulder  (CX35FU)   Squamous cell carcinoma of skin 04/28/2014   well diff-right shin (txpbx)   Squamous cell carcinoma of skin 11/02/2014   in situ-Left shin (txpbx)   Squamous cell carcinoma of skin 12/15/2014   in situ-Left hand (txpbx), bowens-left side chest (txpbx)   Squamous cell carcinoma of skin 05/09/2015   in situ-left hand (txpbx)    Squamous cell carcinoma of skin 05/07/2016   in situ-left inner shin,ant, in situ-left inner shin, post, in situ-left outer forearm, in situ-right knuckle   Squamous cell carcinoma of skin 03/1302018   in situ-left shoulder (txpbx), in situ-right inner shin (txpbx), in situ-top of left foot (txpbx), KA- left forearm (txpbx)   Squamous cell carcinoma of skin 12/02/2016   in situ-left inner shin (txpbx)   Squamous cell carcinoma of skin 03/04/2017   in situ-left outer sup, shin (txpbx), in situ- right 2nd knuckle finger (txpbx), in situ- Left wrist (txpbx)   Squamous cell carcinoma of skin 04/06/2018   in situ-above left knee inner (txpbx)   Squamous cell carcinoma of skin 04/21/2018   in situ-right top hand (txpbx)   Squamous cell carcinoma of skin 07/08/2018   in situ-right lower inner shin (txpbx)   Squamous cell carcinoma of skin  04/27/2019   in situ- Left neck(CX35FU), In situ- right neck (CX35FU)   Urinary retention    sees Dr.Wrenn about 2 times a yr   Past Surgical History:  Procedure Laterality Date   [place]     with TAH   BACK SURGERY     several   BREAST BIOPSY Right    Results were negative   BROW LIFT Bilateral 10/02/2016   Procedure: BILATERAL LOWER LID BLEPHAROPLASTY;  Surgeon: Clista Bernhardt, MD;  Location: [facility];  Service: Plastics;  Laterality: Bilateral;   CARPAL TUNNEL RELEASE Right    cataracts     CERVICAL FUSION     Dr Joya Salm   CERVICAL FUSION  12/31/2012   Dr Joya Salm   CHEST TUBE INSERTION     for traumatic Pneumothorax   COLONOSCOPY  07/16/2010   normal    COLONOSCOPY W/  POLYPECTOMY  1997   negative since; Dr Deatra Ina   ESOPHAGOGASTRODUODENOSCOPY  07/16/2010   normal   eye lid raise     EYE SURGERY Bilateral    cataracts removed - /w IOL   JOINT REPLACEMENT     LUMBAR FUSION     Dr Joya Salm   NASAL SINUS SURGERY     POSTERIOR CERVICAL FUSION/FORAMINOTOMY N/A 12/31/2012   Procedure: POSTERIOR LATERAL CERVICAL FUSION/FORAMINOTOMY LEVEL 1 CERVICAL THREE-FOUR WITH LATERAL MASS SCREWS;  Surgeon: Floyce Stakes, MD;  Location: [facility];  Service: Neurosurgery;  Laterality: N/A;   PTOSIS REPAIR Bilateral 10/02/2016   Procedure: INTERNAL PTOSIS REPAIR;  Surgeon: Clista Bernhardt, MD;  Location: [facility];  Service: Plastics;  Laterality: Bilateral;   SKIN BIOPSY     THYROID SURGERY     R lobe removed- 1972, has grown back - CT last done- 2018   TOTAL HIP ARTHROPLASTY  12/22/2011   Procedure: TOTAL HIP ARTHROPLASTY;  Surgeon: Kerin Salen, MD;  Location: [facility];  Service: Orthopedics;  Laterality: Left;   TOTAL SHOULDER ARTHROPLASTY     TUBAL LIGATION     UPPER GASTROINTESTINAL ENDOSCOPY  2012   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   Patient Active Problem List   Diagnosis Date Noted   Lumbar vertebral fracture, pathologic 09/20/2021   Degenerative spondylolisthesis 08/19/2021   Acute left lumbar radiculopathy 07/05/2021   Closed fracture of rib of right side with delayed healing 05/29/2021   Senile purpura ([place]) 12/19/2020   Restless leg 02/02/2020   Bilateral leg edema 01/11/2020   Cervical radiculopathy 06/28/2019   Carpal tunnel syndrome on both sides 06/28/2019   Right sided sciatica 04/14/2018   Trochanteric bursitis of right hip 04/14/2018   S/P cervical spinal fusion 11/10/2017   Dysphagia 10/26/2017   Gastroesophageal reflux disease 08/06/2017   Easy bruising 08/06/2017   Mid back pain 03/26/2017   Submandibular gland swelling 01/08/2017   High risk medication use 09/02/2016   Piriformis muscle pain 09/02/2016   Right hip pain 04/08/2016    Right knee pain 02/25/2016   Numbness 11/14/2014   Biceps tendon tear 10/10/2014   Insomnia 10/10/2014   Gait disturbance 06/13/2014   OP (osteoporosis) 01/18/2014   Other long term (current) drug therapy 01/18/2014   Hoarseness 12/29/2013   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 01/01/2013   Personal history of colonic polyps 07/03/2010   Nocturia 05/28/2010   THYROID NODULE 05/17/2008   Multiple sclerosis ([place]) 05/17/2008   Other fatigue 03/30/2007   Hyperlipidemia 08/20/2006   Essential  hypertension 08/20/2006   Rheumatoid arthritis ([place]) 08/20/2006   CERVICAL CANCER, HX OF 08/20/2006   GASTRIC ULCER, HX OF 08/20/2006    REFERRING DIAG: 4th lumbar vertebrae fracture, neck pain  THERAPY DIAG:  No diagnosis found.  Rationale for Evaluation and Treatment Rehabilitation  PERTINENT HISTORY: MS, RA, hx of skin cancer, L THA, posterior cervical fusion C3-4 2014, osteoporosis   PRECAUTIONS: back precautions -no BLTs, 5# lifting limit, brace when out of house  SUBJECTIVE: States that she had back surgery for the first time with hardware placed in her back over 10 years ago. States her MD went in to fix original surgery earlier this year and was fine until about a week after the surgery where she started having extreme radicular symptoms. Imaging demonstrated a compression fracture at L4. MD performed second surgery with larger fusion to help with fixation and has been in a lumbar brace since the surgery. As of last week she was allowed to come out of the brace to walk around the house and when she lays/sits down to rest.   Patient is primarily concerned with weakness in her legs. Her balance is off but it is off because of her MS. States she can currently stand for 5 minutes at a time  PAIN:  Are you having pain? Yes: NPRS scale: 3/10 Pain location: low back Pain description: ache Aggravating factors: too active  Relieving factors: heat  OBJECTIVE:    DIAGNOSTIC FINDINGS:   CT lumbar spine 09/12/21 FINDINGS: Segmentation: 5 lumbar type vertebrae.   Alignment: Approximately 6 mm anterolisthesis at L3-L4.   Vertebrae: Osseous demineralization. Postoperative changes are present with rods and pedicle screws at L3-L4. There is subsidence of a left-sided interbody spacer into L3. Subsidence right sided interbody spacer into fractured L4 vertebral body. Rods and pedicle screws are also present at L5-S1. Interbody spacers are present at L4-L5 and L5-S1. Laminectomies are present at the operative levels. Streak artifact associated with the hardware.     IMPRESSION: Known L4 fracture. Postoperative changes as described with intact hardware. There is subsidence of the L3-L4 interbody spacers into L3 and L4. Canal is not well evaluated at the fracture and operative levels due to artifact. There is probable bilateral foraminal narrowing at L4-L5 secondary to distortion from the fracture.       SCREENING FOR RED FLAGS: Bowel or bladder incontinence: No Spinal tumors: No Cauda equina syndrome: No Compression fracture: Yes: from 4/23 Abdominal aneurysm: No   COGNITION:           Overall cognitive status: Within functional limits for tasks assessed                          SENSATION: WFL   MUSCLE LENGTH: Hamstrings: Right 0 deg; Left 20 deg     POSTURE: rounded shoulders, forward head, posterior pelvic tilt, and flexed trunk      LUMBAR ROM: - not assessed                      LE Measurements       Lower Extremity Right 01/21/2022 Left 01/21/2022    A/PROM MMT A/PROM MMT  Hip Flexion 120 4 120 4-  Hip Extension          Hip Abduction          Hip Adduction          Hip Internal rotation 45   55  Hip External rotation 45   45    Knee Flexion   4-   3+  Knee Extension   4   4-  Ankle Dorsiflexion   4   4-  Ankle Plantarflexion          Ankle Inversion          Ankle Eversion           (Blank rows = not tested)            * pain      LUMBAR SPECIAL TESTS:  Slump test - neg   FUNCTIONAL TESTS:  STS - can get out of chair without UE support but favors R LE Standing - leans to the right                       Unable to stand on either leg in SLS or in tandem (patient reports this is 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) GAIT: Distance walked: 50 feet Assistive device utilized: None Level of assistance: Complete Independence Comments: with lumbar brace on- stiff, limited hip extension, leans to the right       TODAY'S TREATMENT  01/21/2022 Therapeutic Exercise:    Aerobic: Supine: hip add iso x10 5" holds, hip abd iso holds red band x10 5" holds, bent knee fall outs 1.5 minutes Prone:    Seated:    Standing: Neuromuscular Re-education: Manual Therapy: Therapeutic Activity: Self Care: Trigger Point Dry Needling:  Modalities:          PATIENT EDUCATION:  Education details: on current presentation, on HEP, on clinical outcomes score and POC Person educated: Patient Education method: Explanation, Demonstration, and Handouts Education comprehension: verbalized understanding       HOME EXERCISE PROGRAM: [id number]   ASSESSMENT:   CLINICAL IMPRESSION: Patient presents to therapy with history of 3 lumbar surgeries. Initially surgery from years ago failed and MD was repairing the original fixation on 08/19/21. Unfortunately, with patient's osteoporosis, the fusion did not take and a third surgery on 09/20/21 was warranted to fixate a greater area. Patient has been in a lumbar brace since the first surgery and has only just been allowed to reduce the time she spends in it at home. Patient presents with weakness and would greatly benefit from skilled PT to improve overall function and QOL.     OBJECTIVE IMPAIRMENTS Abnormal gait, decreased activity tolerance, decreased balance, decreased knowledge of use of DME, decreased mobility, difficulty walking, decreased ROM, decreased strength, improper body mechanics, postural  dysfunction, and pain.    ACTIVITY LIMITATIONS carrying, lifting, bending, sitting, standing, squatting, stairs, bed mobility, and locomotion level   PARTICIPATION LIMITATIONS: meal prep, cleaning, and community activity   PERSONAL FACTORS Age, Fitness, and 3+ comorbidities: MS, x4 lx surgeries, osteoporosis  are also affecting patient's functional outcome.    REHAB POTENTIAL: Fair given patients history   CLINICAL DECISION MAKING: Evolving/moderate complexity   EVALUATION COMPLEXITY: Moderate     GOALS: Goals reviewed with patient?  yes   SHORT TERM GOALS:   Patient will be independent in self management strategies to improve quality of life and functional outcomes. Baseline: new program Target date: 02/25/2022 Goal status: INITIAL   2.  Patient will report at least 50% improvement in overall symptoms and/or function to demonstrate improved functional mobility Baseline: 0% Target date: 02/25/2022 Goal status: INITIAL   3.  Patient will be able to stand for at least 15 minutes with brace to demonstrate improved standing endurance  Baseline: 5 minutes Target date: 02/25/2022 Goal status: INITIAL           LONG TERM GOALS:   Patient will report at least 75% improvement in overall symptoms and/or function to demonstrate improved functional mobility Baseline: 0% Target date: 04/01/2022 Goal status: INITIAL   2.  Patient will be able to demonstrate at least 4/5 MMT in bilateral legs to demonstrate improved strength. Baseline: see above Target date: 04/01/2022 Goal status: INITIAL   3.  Patient will be able to stand for at least 25 minutes at a time to improve ability to cook meals Baseline: 5 minutes Target date: 04/01/2022 Goal status: INITIAL     PLAN: PT FREQUENCY: 2x/week   PT DURATION: 10 weeks   PLANNED INTERVENTIONS: Therapeutic exercises, Therapeutic activity, Neuromuscular re-education, Balance training, Gait training, Patient/Family education, Self Care,  Joint mobilization, Joint manipulation, Orthotic/Fit training, Aquatic Therapy, Dry Needling, Cognitive remediation, Electrical stimulation, Cryotherapy, Moist heat, Ultrasound, Ionotophoresis '4mg'$ /ml Dexamethasone, and Manual therapy.   PLAN FOR NEXT SESSION: isometrics, core activation, standing exercises/endurance, LE strengthening. Strict no BLTs and 5# lifting restriction      Lynden Ang, PT 01/30/2022, 12:54 PM

## 2022-02-03 ENCOUNTER — Ambulatory Visit: Payer: Medicare Other | Admitting: Physical Therapy

## 2022-02-03 ENCOUNTER — Encounter: Payer: Self-pay | Admitting: Physical Therapy

## 2022-02-03 DIAGNOSIS — M6281 Muscle weakness (generalized): Secondary | ICD-10-CM | POA: Diagnosis not present

## 2022-02-03 DIAGNOSIS — M542 Cervicalgia: Secondary | ICD-10-CM

## 2022-02-03 DIAGNOSIS — M8448XA Pathological fracture, other site, initial encounter for fracture: Secondary | ICD-10-CM | POA: Diagnosis not present

## 2022-02-04 NOTE — Therapy (Deleted)
OUTPATIENT PHYSICAL THERAPY TREATMENT NOTE   Patient Name: Sherri [name] MRN: [id number] DOB:17-Feb-1941, 81 y.o., female Today's Date: 02/04/2022  PCP: Binnie Rail MD REFERRING PROVIDER:  Viona Gilmore, NP  END OF SESSION:     Past Medical History:  Diagnosis Date   Anxiety    takes Valium daily as needed   Basal cell carcinoma 01/21/1988   left nostril (MOHS), sup-right calf (CX35FU)   Basal cell carcinoma 03/22/1996   right calf (CX35FU)   Basal cell carcinoma 03/31/1995   left wing nose   Bruises easily    d/t meds   Cancer ([place])    basal cell ca, in situ- uterine    Cataracts, bilateral    removed bilateral   Chronic back pain    Dizziness    r/t to meds   GERD (gastroesophageal reflux disease)    no meds on a regular basis but will take Tums if needed   Headache(784.0)    r/t neck issues   History of bronchitis 6-26yr ago   History of colon polyps    benign   HOH (hard of hearing)    wears hearing aids   Hyperlipidemia    takes Atorvastatin on Mondays and Fridays   Hypertension    Joint pain    Joint swelling    Multiple sclerosis ([place]    Neuromuscular disorder ([place]    Dr. RPark Liter [facility], follows M.S.   Nocturia    PONV (postoperative nausea and vomiting)    trouble urinating after surgery in 2014   Postoperative nausea and vomiting 01/11/2019   Rheumatoid arthritis ([place]    Dr VJani Gravelweekly, RA- hands- knees- feet    Rheumatoid arthritis(714.0)    Dr DEstanislado Pandytakes XMorrie Sheldondaily   Right wrist fracture    Skin cancer    Squamous cell carcinoma of skin 11/02/2001   in situ-right knee (cx354f   Squamous cell carcinoma of skin 11/12/2011   in situ-left forearm (CX35FU), in situ-left foot (CX35FU)   Squamous cell carcinoma of skin 01/22/2012   in situ-left lower forearm (txpbx)   Squamous cell carcinoma of skin 11/17/2012   right shin (txpbx)   Squamous cell carcinoma of skin 10/28/2013   in situ-right  shoulder (CX35FU)   Squamous cell carcinoma of skin 04/28/2014   well diff-right shin (txpbx)   Squamous cell carcinoma of skin 11/02/2014   in situ-Left shin (txpbx)   Squamous cell carcinoma of skin 12/15/2014   in situ-Left hand (txpbx), bowens-left side chest (txpbx)   Squamous cell carcinoma of skin 05/09/2015   in situ-left hand (txpbx)    Squamous cell carcinoma of skin 05/07/2016   in situ-left inner shin,ant, in situ-left inner shin, post, in situ-left outer forearm, in situ-right knuckle   Squamous cell carcinoma of skin 03/1302018   in situ-left shoulder (txpbx), in situ-right inner shin (txpbx), in situ-top of left foot (txpbx), KA- left forearm (txpbx)   Squamous cell carcinoma of skin 12/02/2016   in situ-left inner shin (txpbx)   Squamous cell carcinoma of skin 03/04/2017   in situ-left outer sup, shin (txpbx), in situ- right 2nd knuckle finger (txpbx), in situ- Left wrist (txpbx)   Squamous cell carcinoma of skin 04/06/2018   in situ-above left knee inner (txpbx)   Squamous cell carcinoma of skin 04/21/2018   in situ-right top hand (txpbx)   Squamous cell carcinoma of skin 07/08/2018   in situ-right lower inner shin (txpbx)   Squamous cell carcinoma of  skin 04/27/2019   in situ- Left neck(CX35FU), In situ- right neck (CX35FU)   Urinary retention    sees Dr.Wrenn about 2 times a yr   Past Surgical History:  Procedure Laterality Date   [place]     with TAH   BACK SURGERY     several   BREAST BIOPSY Right    Results were negative   BROW LIFT Bilateral 10/02/2016   Procedure: BILATERAL LOWER LID BLEPHAROPLASTY;  Surgeon: Clista Bernhardt, MD;  Location: [facility];  Service: Plastics;  Laterality: Bilateral;   CARPAL TUNNEL RELEASE Right    cataracts     CERVICAL FUSION     Dr Joya Salm   CERVICAL FUSION  12/31/2012   Dr Joya Salm   CHEST TUBE INSERTION     for traumatic Pneumothorax   COLONOSCOPY  07/16/2010   normal     COLONOSCOPY W/ POLYPECTOMY  1997   negative since; Dr Deatra Ina   ESOPHAGOGASTRODUODENOSCOPY  07/16/2010   normal   eye lid raise     EYE SURGERY Bilateral    cataracts removed - /w IOL   JOINT REPLACEMENT     LUMBAR FUSION     Dr Joya Salm   NASAL SINUS SURGERY     POSTERIOR CERVICAL FUSION/FORAMINOTOMY N/A 12/31/2012   Procedure: POSTERIOR LATERAL CERVICAL FUSION/FORAMINOTOMY LEVEL 1 CERVICAL THREE-FOUR WITH LATERAL MASS SCREWS;  Surgeon: Floyce Stakes, MD;  Location: [facility];  Service: Neurosurgery;  Laterality: N/A;   PTOSIS REPAIR Bilateral 10/02/2016   Procedure: INTERNAL PTOSIS REPAIR;  Surgeon: Clista Bernhardt, MD;  Location: [facility];  Service: Plastics;  Laterality: Bilateral;   SKIN BIOPSY     THYROID SURGERY     R lobe removed- 1972, has grown back - CT last done- 2018   TOTAL HIP ARTHROPLASTY  12/22/2011   Procedure: TOTAL HIP ARTHROPLASTY;  Surgeon: Kerin Salen, MD;  Location: [facility];  Service: Orthopedics;  Laterality: Left;   TOTAL SHOULDER ARTHROPLASTY     TUBAL LIGATION     UPPER GASTROINTESTINAL ENDOSCOPY  2012   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   Patient Active Problem List   Diagnosis Date Noted   Lumbar vertebral fracture, pathologic 09/20/2021   Degenerative spondylolisthesis 08/19/2021   Acute left lumbar radiculopathy 07/05/2021   Closed fracture of rib of right side with delayed healing 05/29/2021   Senile purpura ([place]) 12/19/2020   Restless leg 02/02/2020   Bilateral leg edema 01/11/2020   Cervical radiculopathy 06/28/2019   Carpal tunnel syndrome on both sides 06/28/2019   Right sided sciatica 04/14/2018   Trochanteric bursitis of right hip 04/14/2018   S/P cervical spinal fusion 11/10/2017   Dysphagia 10/26/2017   Gastroesophageal reflux disease 08/06/2017   Easy bruising 08/06/2017   Mid back pain 03/26/2017   Submandibular gland swelling 01/08/2017   High risk medication use 09/02/2016   Piriformis muscle pain 09/02/2016   Right hip pain  04/08/2016   Right knee pain 02/25/2016   Numbness 11/14/2014   Biceps tendon tear 10/10/2014   Insomnia 10/10/2014   Gait disturbance 06/13/2014   OP (osteoporosis) 01/18/2014   Other long term (current) drug therapy 01/18/2014   Hoarseness 12/29/2013   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 01/01/2013   Personal history of colonic polyps 07/03/2010   Nocturia 05/28/2010   THYROID NODULE 05/17/2008   Multiple sclerosis ([place]) 05/17/2008   Other fatigue 03/30/2007   Hyperlipidemia 08/20/2006  Essential hypertension 08/20/2006   Rheumatoid arthritis ([place]) 08/20/2006   CERVICAL CANCER, HX OF 08/20/2006   GASTRIC ULCER, HX OF 08/20/2006    REFERRING DIAG: 4th lumbar vertebrae fracture, neck pain  THERAPY DIAG:  No diagnosis found.  Rationale for Evaluation and Treatment Rehabilitation  PERTINENT HISTORY: MS, RA, hx of skin cancer, L THA, posterior cervical fusion C3-4 2014, osteoporosis   PRECAUTIONS: back precautions -no BLTs, 5# lifting limit, brace when out of house  SUBJECTIVE: Pt reports increased pain yesterday after actively cleaning her house without her brace on. Se states that she is feeling better today.   Eval: States that she had back surgery for the first time with hardware placed in her back over 10 years ago. States her MD went in to fix original surgery earlier this year and was fine until about a week after the surgery where she started having extreme radicular symptoms. Imaging demonstrated a compression fracture at L4. MD performed second surgery with larger fusion to help with fixation and has been in a lumbar brace since the surgery. As of last week she was allowed to come out of the brace to walk around the house and when she lays/sits down to rest.   Patient is primarily concerned with weakness in her legs. Her balance is off but it is off because of her MS. States she can currently stand for 5 minutes at a time  PAIN:  Are you having pain? Yes: NPRS  scale: 3/10 Pain location: low back Pain description: ache Aggravating factors: too active  Relieving factors: heat  OBJECTIVE:    DIAGNOSTIC FINDINGS:  CT lumbar spine 09/12/21 FINDINGS: Segmentation: 5 lumbar type vertebrae.   Alignment: Approximately 6 mm anterolisthesis at L3-L4.   Vertebrae: Osseous demineralization. Postoperative changes are present with rods and pedicle screws at L3-L4. There is subsidence of a left-sided interbody spacer into L3. Subsidence right sided interbody spacer into fractured L4 vertebral body. Rods and pedicle screws are also present at L5-S1. Interbody spacers are present at L4-L5 and L5-S1. Laminectomies are present at the operative levels. Streak artifact associated with the hardware.     IMPRESSION: Known L4 fracture. Postoperative changes as described with intact hardware. There is subsidence of the L3-L4 interbody spacers into L3 and L4. Canal is not well evaluated at the fracture and operative levels due to artifact. There is probable bilateral foraminal narrowing at L4-L5 secondary to distortion from the fracture.       SCREENING FOR RED FLAGS: Bowel or bladder incontinence: No Spinal tumors: No Cauda equina syndrome: No Compression fracture: Yes: from 4/23 Abdominal aneurysm: No   COGNITION:           Overall cognitive status: Within functional limits for tasks assessed                          SENSATION: WFL   MUSCLE LENGTH: Hamstrings: Right 0 deg; Left 20 deg     POSTURE: rounded shoulders, forward head, posterior pelvic tilt, and flexed trunk      LUMBAR ROM: - not assessed                      LE Measurements       Lower Extremity Right 01/21/2022 Left 01/21/2022    A/PROM MMT A/PROM MMT  Hip Flexion 120 4 120 4-  Hip Extension          Hip Abduction  Hip Adduction          Hip Internal rotation 45   55    Hip External rotation 45   45    Knee Flexion   4-   3+  Knee Extension   4   4-  Ankle  Dorsiflexion   4   4-  Ankle Plantarflexion          Ankle Inversion          Ankle Eversion           (Blank rows = not tested)            * pain     LUMBAR SPECIAL TESTS:  Slump test - neg   FUNCTIONAL TESTS:  STS - can get out of chair without UE support but favors R LE Standing - leans to the right                       Unable to stand on either leg in SLS or in tandem (patient reports this is 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) GAIT: Distance walked: 50 feet Assistive device utilized: None Level of assistance: Complete Independence Comments: with lumbar brace on- stiff, limited hip extension, leans to the right       TODAY'S TREATMENT  [facility] Adult PT Treatment:                                                DATE: 02/03/2022 Therapeutic Exercise: Small range LTR x20  Adductor squeezes X20  Supine clams with RTB X20  PPT x20 Neuromuscular re-ed: Ta activation 5 sec hold, 20x Glute activation 5 sec hold 20x LAQ x20  01/21/2022 Therapeutic Exercise:    Aerobic: Supine: hip add iso x10 5" holds, hip abd iso holds red band x10 5" holds, bent knee fall outs 1.5 minutes Prone:    Seated:    Standing: Neuromuscular Re-education: Manual Therapy: Therapeutic Activity: Self Care: Trigger Point Dry Needling:  Modalities:          PATIENT EDUCATION:  Education details: on current presentation, on HEP, on clinical outcomes score and POC Person educated: Patient Education method: Explanation, Demonstration, and Handouts Education comprehension: verbalized understanding       HOME EXERCISE PROGRAM: [id number]   ASSESSMENT:   CLINICAL IMPRESSION: Pt presents to first follow up appt with reports of mild pain today. She states that she has been active with her HEP, but has also been cleaning her house. She reports not wearing her brace over the weekend causing an increase in lower back pain. Clarified with MD that pt is able to perform BLT movements. Todays session with focus  on gentle mobility and activation of TA, glutes and quads. Pt educated on bodies natural corset and importance of engaging core muscles. Pt tolerates all exercises well today with no reported pain. Plan to continue with strength and mobility as pt is able. Pt will continue to benefit from skilled PT to address continued deficits.       OBJECTIVE IMPAIRMENTS Abnormal gait, decreased activity tolerance, decreased balance, decreased knowledge of use of DME, decreased mobility, difficulty walking, decreased ROM, decreased strength, improper body mechanics, postural dysfunction, and pain.    ACTIVITY LIMITATIONS carrying, lifting, bending, sitting, standing, squatting, stairs, bed mobility, and locomotion level   PARTICIPATION LIMITATIONS: meal prep, cleaning, and community  activity   PERSONAL FACTORS Age, Fitness, and 3+ comorbidities: MS, x4 lx surgeries, osteoporosis  are also affecting patient's functional outcome.    REHAB POTENTIAL: Fair given patients history   CLINICAL DECISION MAKING: Evolving/moderate complexity   EVALUATION COMPLEXITY: Moderate     GOALS: Goals reviewed with patient?  yes   SHORT TERM GOALS:   Patient will be independent in self management strategies to improve quality of life and functional outcomes. Baseline: new program Target date: 02/25/2022 Goal status: INITIAL   2.  Patient will report at least 50% improvement in overall symptoms and/or function to demonstrate improved functional mobility Baseline: 0% Target date: 02/25/2022 Goal status: INITIAL   3.  Patient will be able to stand for at least 15 minutes with brace to demonstrate improved standing endurance Baseline: 5 minutes Target date: 02/25/2022 Goal status: INITIAL           LONG TERM GOALS:   Patient will report at least 75% improvement in overall symptoms and/or function to demonstrate improved functional mobility Baseline: 0% Target date: 04/01/2022 Goal status: INITIAL   2.   Patient will be able to demonstrate at least 4/5 MMT in bilateral legs to demonstrate improved strength. Baseline: see above Target date: 04/01/2022 Goal status: INITIAL   3.  Patient will be able to stand for at least 25 minutes at a time to improve ability to cook meals Baseline: 5 minutes Target date: 04/01/2022 Goal status: INITIAL     PLAN: PT FREQUENCY: 2x/week   PT DURATION: 10 weeks   PLANNED INTERVENTIONS: Therapeutic exercises, Therapeutic activity, Neuromuscular re-education, Balance training, Gait training, Patient/Family education, Self Care, Joint mobilization, Joint manipulation, Orthotic/Fit training, Aquatic Therapy, Dry Needling, Cognitive remediation, Electrical stimulation, Cryotherapy, Moist heat, Ultrasound, Ionotophoresis '4mg'$ /ml Dexamethasone, and Manual therapy.   PLAN FOR NEXT SESSION: isometrics, core activation, standing exercises/endurance, LE strengthening. Strict no BLTs and 5# lifting restriction      Lynden Ang, PT 02/04/2022, 3:46 PM

## 2022-02-06 ENCOUNTER — Encounter: Payer: Medicare Other | Admitting: Physical Therapy

## 2022-02-07 ENCOUNTER — Other Ambulatory Visit: Payer: Self-pay | Admitting: Internal Medicine

## 2022-02-10 ENCOUNTER — Ambulatory Visit: Payer: Medicare Other | Admitting: Physical Therapy

## 2022-02-10 ENCOUNTER — Encounter: Payer: Self-pay | Admitting: Physical Therapy

## 2022-02-10 DIAGNOSIS — M8448XA Pathological fracture, other site, initial encounter for fracture: Secondary | ICD-10-CM | POA: Diagnosis not present

## 2022-02-10 DIAGNOSIS — M542 Cervicalgia: Secondary | ICD-10-CM

## 2022-02-10 DIAGNOSIS — M6281 Muscle weakness (generalized): Secondary | ICD-10-CM | POA: Diagnosis not present

## 2022-02-10 NOTE — Therapy (Signed)
OUTPATIENT PHYSICAL THERAPY TREATMENT NOTE   Patient Name: Sherri [name] MRN: [id number] DOB:08-03-1940, 81 y.o., female Today's Date: 02/10/2022  PCP: Binnie Rail MD REFERRING PROVIDER:  Viona Gilmore, NP  END OF SESSION:   PT End of Session - 02/10/22 1518     Visit Number 3    Number of Visits 20    Date for PT Re-Evaluation 04/01/22    Progress Note Due on Visit 10    PT Start Time 1519    PT Stop Time 1540    PT Time Calculation (min) 38 min    Activity Tolerance Patient tolerated treatment well    Behavior During Therapy [facility] for tasks assessed/performed             Past Medical History:  Diagnosis Date   Anxiety    takes Valium daily as needed   Basal cell carcinoma 01/21/1988   left nostril (MOHS), sup-right calf (CX35FU)   Basal cell carcinoma 03/22/1996   right calf (CX35FU)   Basal cell carcinoma 03/31/1995   left wing nose   Bruises easily    d/t meds   Cancer (HCC)    basal cell ca, in situ- uterine    Cataracts, bilateral    removed bilateral   Chronic back pain    Dizziness    r/t to meds   GERD (gastroesophageal reflux disease)    no meds on a regular basis but will take Tums if needed   Headache(784.0)    r/t neck issues   History of bronchitis 6-57yr ago   History of colon polyps    benign   HOH (hard of hearing)    wears hearing aids   Hyperlipidemia    takes Atorvastatin on Mondays and Fridays   Hypertension    Joint pain    Joint swelling    Multiple sclerosis ([place]    [place]rom[place]    Dr. RPark Liter [facility], follows M.S.   Nocturia    PONV (postoperative nausea and vomiting)    trouble urinating after surgery in 2014   Postoperative nausea and vomiting 01/11/2019   Rheumatoid arthritis ([place]    Dr VJani Gravelweekly, RA- hands- knees- feet    Rheumatoid arthritis(714.0)    Dr DEstanislado Pandytakes XMorrie Sheldondaily   Right wrist fracture    Skin cancer    Squamous cell carcinoma of skin  11/02/2001   in situ-right knee (cx335f   Squamous cell carcinoma of skin 11/12/2011   in situ-left forearm (CX35FU), in situ-left foot (CX35FU)   Squamous cell carcinoma of skin 01/22/2012   in situ-left lower forearm (txpbx)   Squamous cell carcinoma of skin 11/17/2012   right shin (txpbx)   Squamous cell carcinoma of skin 10/28/2013   in situ-right shoulder (CX35FU)   Squamous cell carcinoma of skin 04/28/2014   well diff-right shin (txpbx)   Squamous cell carcinoma of skin 11/02/2014   in situ-Left shin (txpbx)   Squamous cell carcinoma of skin 12/15/2014   in situ-Left hand (txpbx), bowens-left side chest (txpbx)   Squamous cell carcinoma of skin 05/09/2015   in situ-left hand (txpbx)    Squamous cell carcinoma of skin 05/07/2016   in situ-left inner shin,ant, in situ-left inner shin, post, in situ-left outer forearm, in situ-right knuckle   Squamous cell carcinoma of skin 03/1302018   in situ-left shoulder (txpbx), in situ-right inner shin (txpbx), in situ-top of left foot (txpbx), KA- left forearm (txpbx)   Squamous cell carcinoma of  skin 12/02/2016   in situ-left inner shin (txpbx)   Squamous cell carcinoma of skin 03/04/2017   in situ-left outer sup, shin (txpbx), in situ- right 2nd knuckle finger (txpbx), in situ- Left wrist (txpbx)   Squamous cell carcinoma of skin 04/06/2018   in situ-above left knee inner (txpbx)   Squamous cell carcinoma of skin 04/21/2018   in situ-right top hand (txpbx)   Squamous cell carcinoma of skin 07/08/2018   in situ-right lower inner shin (txpbx)   Squamous cell carcinoma of skin 04/27/2019   in situ- Left neck(CX35FU), In situ- right neck (CX35FU)   Urinary retention    sees Dr.Wrenn about 2 times a yr   Past Surgical History:  Procedure Laterality Date   [place]     with TAH   BACK SURGERY     several   BREAST BIOPSY Right    Results were negative   BROW LIFT Bilateral 10/02/2016    Procedure: BILATERAL LOWER LID BLEPHAROPLASTY;  Surgeon: Clista Bernhardt, MD;  Location: [place];  Service: Plastics;  Laterality: Bilateral;   CARPAL TUNNEL RELEASE Right    cataracts     CERVICAL FUSION     Dr Joya Salm   CERVICAL FUSION  12/31/2012   Dr Joya Salm   CHEST TUBE INSERTION     for traumatic Pneumothorax   COLONOSCOPY  07/16/2010   normal    COLONOSCOPY W/ POLYPECTOMY  1997   negative since; Dr Deatra Ina   ESOPHAGOGASTRODUODENOSCOPY  07/16/2010   normal   eye lid raise     EYE SURGERY Bilateral    cataracts removed - /w IOL   JOINT REPLACEMENT     LUMBAR FUSION     Dr Joya Salm   NASAL SINUS SURGERY     POSTERIOR CERVICAL FUSION/FORAMINOTOMY N/A 12/31/2012   Procedure: POSTERIOR LATERAL CERVICAL FUSION/FORAMINOTOMY LEVEL 1 CERVICAL THREE-FOUR WITH LATERAL MASS SCREWS;  Surgeon: Floyce Stakes, MD;  Location: [place] [facility];  Service: Neurosurgery;  Laterality: N/A;   PTOSIS REPAIR Bilateral 10/02/2016   Procedure: INTERNAL PTOSIS REPAIR;  Surgeon: Clista Bernhardt, MD;  Location: [place];  Service: Plastics;  Laterality: Bilateral;   SKIN BIOPSY     THYROID SURGERY     R lobe removed- 1972, has grown back - CT last done- 2018   TOTAL HIP ARTHROPLASTY  12/22/2011   Procedure: TOTAL HIP ARTHROPLASTY;  Surgeon: Kerin Salen, MD;  Location: [place];  Service: Orthopedics;  Laterality: Left;   TOTAL SHOULDER ARTHROPLASTY     TUBAL LIGATION     UPPER GASTROINTESTINAL ENDOSCOPY  2012   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   Patient Active Problem List   Diagnosis Date Noted   Lumbar vertebral fracture, pathologic 09/20/2021   Degenerative spondylolisthesis 08/19/2021   Acute left lumbar radiculopathy 07/05/2021   Closed fracture of rib of right side with delayed healing 05/29/2021   Senile purpura ([place]) 12/19/2020   Restless leg 02/02/2020   Bilateral leg edema 01/11/2020   Cervical radiculopathy 06/28/2019   Carpal tunnel syndrome on both sides 06/28/2019   Right sided sciatica  04/14/2018   Trochanteric bursitis of right hip 04/14/2018   S/P cervical spinal fusion 11/10/2017   Dysphagia 10/26/2017   Gastroesophageal reflux disease 08/06/2017   Easy bruising 08/06/2017   Mid back pain 03/26/2017   Submandibular gland swelling 01/08/2017   High risk medication use 09/02/2016   Piriformis muscle pain 09/02/2016  Right hip pain 04/08/2016   Right knee pain 02/25/2016   Numbness 11/14/2014   Biceps tendon tear 10/10/2014   Insomnia 10/10/2014   Gait disturbance 06/13/2014   OP (osteoporosis) 01/18/2014   Other long term (current) drug therapy 01/18/2014   Hoarseness 12/29/2013   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 01/01/2013   Personal history of colonic polyps 07/03/2010   Nocturia 05/28/2010   THYROID NODULE 05/17/2008   Multiple sclerosis ([place]) 05/17/2008   Other fatigue 03/30/2007   Hyperlipidemia 08/20/2006   Essential hypertension 08/20/2006   Rheumatoid arthritis ([place]) 08/20/2006   CERVICAL CANCER, HX OF 08/20/2006   GASTRIC ULCER, HX OF 08/20/2006    REFERRING DIAG: 4th lumbar vertebrae fracture, neck pain  THERAPY DIAG:  Pathological fracture of lumbar vertebra, initial encounter  Cervicalgia  Muscle weakness (generalized)  Rationale for Evaluation and Treatment Rehabilitation  PERTINENT HISTORY: MS, RA, hx of skin cancer, L THA, posterior cervical fusion C3-4 2014, osteoporosis   PRECAUTIONS: released - ease into lifting and BTLS  SUBJECTIVE: States she used to squat real low but since her surgery she can't get up as she is so weak. States out of habit she squats down but can't get up. States she is no longer needing her brace.  Eval: States that she had back surgery for the first time with hardware placed in her back over 10 years ago. States her MD went in to fix original surgery earlier this year and was fine until about a week after the surgery where she started having extreme radicular symptoms. Imaging demonstrated a  compression fracture at L4. MD performed second surgery with larger fusion to help with fixation and has been in a lumbar brace since the surgery. As of last week she was allowed to come out of the brace to walk around the house and when she lays/sits down to rest.   Patient is primarily concerned with weakness in her legs. Her balance is off but it is off because of her MS. States she can currently stand for 5 minutes at a time  PAIN:  Are you having pain? Yes: NPRS scale: 2/10 Pain location: low back Pain description: ache Aggravating factors: too active  Relieving factors: heat  OBJECTIVE:    DIAGNOSTIC FINDINGS:  CT lumbar spine 09/12/21 FINDINGS: Segmentation: 5 lumbar type vertebrae.   Alignment: Approximately 6 mm anterolisthesis at L3-L4.   Vertebrae: Osseous demineralization. Postoperative changes are present with rods and pedicle screws at L3-L4. There is subsidence of a left-sided interbody spacer into L3. Subsidence right sided interbody spacer into fractured L4 vertebral body. Rods and pedicle screws are also present at L5-S1. Interbody spacers are present at L4-L5 and L5-S1. Laminectomies are present at the operative levels. Streak artifact associated with the hardware.     IMPRESSION: Known L4 fracture. Postoperative changes as described with intact hardware. There is subsidence of the L3-L4 interbody spacers into L3 and L4. Canal is not well evaluated at the fracture and operative levels due to artifact. There is probable bilateral foraminal narrowing at L4-L5 secondary to distortion from the fracture.       SCREENING FOR RED FLAGS: Bowel or bladder incontinence: No Spinal tumors: No Cauda equina syndrome: No Compression fracture: Yes: from 4/23 Abdominal aneurysm: No   COGNITION:           Overall cognitive status: Within functional limits for tasks assessed  SENSATION: WFL   MUSCLE LENGTH: Hamstrings: Right 0 deg; Left 20  deg     POSTURE: rounded shoulders, forward head, posterior pelvic tilt, and flexed trunk      LUMBAR ROM: - not assessed                      LE Measurements       Lower Extremity Right 01/21/2022 Left 01/21/2022    A/PROM MMT A/PROM MMT  Hip Flexion 120 4 120 4-  Hip Extension          Hip Abduction          Hip Adduction          Hip Internal rotation 45   55    Hip External rotation 45   45    Knee Flexion   4-   3+  Knee Extension   4   4-  Ankle Dorsiflexion   4   4-  Ankle Plantarflexion          Ankle Inversion          Ankle Eversion           (Blank rows = not tested)            * pain     LUMBAR SPECIAL TESTS:  Slump test - neg   FUNCTIONAL TESTS:  STS - can get out of chair without UE support but favors R LE Standing - leans to the right                       Unable to stand on either leg in SLS or in tandem (patient reports this is 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) GAIT: Distance walked: 50 feet Assistive device utilized: None Level of assistance: Complete Independence Comments: with lumbar brace on- stiff, limited hip extension, leans to the right       TODAY'S TREATMENT  02/10/2022  Therapeutic Exercise:    Aerobic: Supine: bent knee fall outs 2 minutes, bent knee fall ins 2 minutes, LTR to tolerance - 2 minutes, bridges 3x10  Prone:    Seated: hamstring stretch x3 30" holds B    Standing: squat 5" holds x25  counter support, side steps 2 minutes counter support, hip abd 4x10 B counter support Neuromuscular Re-education: Manual Therapy: Therapeutic Activity: Self Care: Trigger Point Dry Needling:  Modalities:          PATIENT EDUCATION:  Education details: on current presentation, on HEP, on clinical outcomes score and POC Person educated: Patient Education method: Explanation, Demonstration, and Handouts Education comprehension: verbalized understanding       HOME EXERCISE PROGRAM: [id number]   ASSESSMENT:   CLINICAL  IMPRESSION: Focused on LE strengthening. Tolerated this well but legs fatigued. Cued patient for rest break as needed. No pain noted during session. Added all new exercises to HEP. Increased difficulties noted on R LE with exercises. Overall patient is doing well and would continue to benefit from skilled PT at this time.       OBJECTIVE IMPAIRMENTS Abnormal gait, decreased activity tolerance, decreased balance, decreased knowledge of use of DME, decreased mobility, difficulty walking, decreased ROM, decreased strength, improper body mechanics, postural dysfunction, and pain.    ACTIVITY LIMITATIONS carrying, lifting, bending, sitting, standing, squatting, stairs, bed mobility, and locomotion level   PARTICIPATION LIMITATIONS: meal prep, cleaning, and community activity   PERSONAL FACTORS Age, Fitness, and 3+ comorbidities: MS, x4 lx surgeries, osteoporosis  are  also affecting patient's functional outcome.    REHAB POTENTIAL: Fair given patients history   CLINICAL DECISION MAKING: Evolving/moderate complexity   EVALUATION COMPLEXITY: Moderate     GOALS: Goals reviewed with patient?  yes   SHORT TERM GOALS:   Patient will be independent in self management strategies to improve quality of life and functional outcomes. Baseline: new program Target date: 02/25/2022 Goal status: INITIAL   2.  Patient will report at least 50% improvement in overall symptoms and/or function to demonstrate improved functional mobility Baseline: 0% Target date: 02/25/2022 Goal status: INITIAL   3.  Patient will be able to stand for at least 15 minutes with brace to demonstrate improved standing endurance Baseline: 5 minutes Target date: 02/25/2022 Goal status: INITIAL           LONG TERM GOALS:   Patient will report at least 75% improvement in overall symptoms and/or function to demonstrate improved functional mobility Baseline: 0% Target date: 04/01/2022 Goal status: INITIAL   2.  Patient will  be able to demonstrate at least 4/5 MMT in bilateral legs to demonstrate improved strength. Baseline: see above Target date: 04/01/2022 Goal status: INITIAL   3.  Patient will be able to stand for at least 25 minutes at a time to improve ability to cook meals Baseline: 5 minutes Target date: 04/01/2022 Goal status: INITIAL     PLAN: PT FREQUENCY: 2x/week   PT DURATION: 10 weeks   PLANNED INTERVENTIONS: Therapeutic exercises, Therapeutic activity, Neuromuscular re-education, Balance training, Gait training, Patient/Family education, Self Care, Joint mobilization, Joint manipulation, Orthotic/Fit training, Aquatic Therapy, Dry Needling, Cognitive remediation, Electrical stimulation, Cryotherapy, Moist heat, Ultrasound, Ionotophoresis '4mg'$ /ml Dexamethasone, and Manual therapy.   PLAN FOR NEXT SESSION: isometrics, core activation, standing exercises/endurance, LE strengthening.     3:58 PM, 02/10/22 [name], DPT Physical Therapy with [facility]

## 2022-02-11 ENCOUNTER — Other Ambulatory Visit: Payer: Self-pay | Admitting: Rheumatology

## 2022-02-11 ENCOUNTER — Telehealth: Payer: Self-pay | Admitting: Rheumatology

## 2022-02-11 NOTE — Telephone Encounter (Signed)
Medication Samples have been provided to the patient.  Drug name: [name]       Strength: 15 mg        Qty: 2  LOT: [id number]  Exp.Date: 03/08/2023  Dosing instructions: Take one tablet by mouth daily.   When picking up samples patient advised she received a Covid vaccine today. Printed instructions for holding medications and gave to patient. Patient advised to hold her [name] and MTX for 1 week after the vaccine. Patient expressed understanding. Patient also inquired about her MTX refill. Patient advised that it was too soon to refill her MTX as a 90 day supply was sent 12/12/2021. Patient to go home and make sure she has enough medication.

## 2022-02-11 NOTE — Telephone Encounter (Signed)
Patient left a voicemail stating she would like to stop by the office and pick up samples of [name].  Patient states she only has enough medication for the next 2 days.  Patient requested a return call.

## 2022-02-11 NOTE — Telephone Encounter (Signed)
Patient advised she may come by the office to pick up samples. Samples reserved in cabinet for patient. She states she will come by this afternoon.

## 2022-02-12 ENCOUNTER — Encounter: Payer: Medicare Other | Admitting: Physical Therapy

## 2022-02-12 ENCOUNTER — Encounter: Payer: Self-pay | Admitting: Internal Medicine

## 2022-02-17 ENCOUNTER — Encounter: Payer: Self-pay | Admitting: Physical Therapy

## 2022-02-17 ENCOUNTER — Ambulatory Visit: Payer: Medicare Other | Admitting: Physical Therapy

## 2022-02-17 DIAGNOSIS — M542 Cervicalgia: Secondary | ICD-10-CM

## 2022-02-17 DIAGNOSIS — M6281 Muscle weakness (generalized): Secondary | ICD-10-CM | POA: Diagnosis not present

## 2022-02-17 DIAGNOSIS — M8448XA Pathological fracture, other site, initial encounter for fracture: Secondary | ICD-10-CM

## 2022-02-17 NOTE — Therapy (Signed)
OUTPATIENT PHYSICAL THERAPY TREATMENT NOTE   Patient Name: Sherri [name] MRN: [id number] DOB:[birth year], 81 y.o., female Today's Date: 02/17/2022  PCP: Binnie Rail MD REFERRING PROVIDER:  Viona Gilmore, NP  END OF SESSION:   PT End of Session - 02/17/22 1455     Visit Number 4    Number of Visits 20    Date for PT Re-Evaluation 04/01/22    Progress Note Due on Visit 10    PT Start Time 9390    PT Stop Time 1505    PT Time Calculation (min) 40 min    Activity Tolerance Patient tolerated treatment well    Behavior During Therapy [facility] for tasks assessed/performed              Past Medical History:  Diagnosis Date   Anxiety    takes Valium daily as needed   Basal cell carcinoma 01/21/1988   left nostril (MOHS), sup-right calf (CX35FU)   Basal cell carcinoma 03/22/1996   right calf (CX35FU)   Basal cell carcinoma 03/31/1995   left wing nose   Bruises easily    d/t meds   Cancer (HCC)    basal cell ca, in situ- uterine    Cataracts, bilateral    removed bilateral   Chronic back pain    Dizziness    r/t to meds   GERD (gastroesophageal reflux disease)    no meds on a regular basis but will take Tums if needed   Headache(784.0)    r/t neck issues   History of bronchitis 6-80yr ago   History of colon polyps    benign   HOH (hard of hearing)    wears hearing aids   Hyperlipidemia    takes Atorvastatin on Mondays and Fridays   Hypertension    Joint pain    Joint swelling    Multiple sclerosis ([place]    Neuromuscular disorder ([place]    Dr. RPark Liter [facility], follows M.S.   Nocturia    PONV (postoperative nausea and vomiting)    trouble urinating after surgery in 2014   Postoperative nausea and vomiting 01/11/2019   Rheumatoid arthritis ([place]    Dr VJani Gravelweekly, RA- hands- knees- feet    Rheumatoid arthritis(714.0)    Dr DEstanislado Pandytakes XMorrie Sheldondaily   Right wrist fracture    Skin cancer    Squamous cell carcinoma of skin  11/02/2001   in situ-right knee (cx368f   Squamous cell carcinoma of skin 11/12/2011   in situ-left forearm (CX35FU), in situ-left foot (CX35FU)   Squamous cell carcinoma of skin 01/22/2012   in situ-left lower forearm (txpbx)   Squamous cell carcinoma of skin 11/17/2012   right shin (txpbx)   Squamous cell carcinoma of skin 10/28/2013   in situ-right shoulder (CX35FU)   Squamous cell carcinoma of skin 04/28/2014   well diff-right shin (txpbx)   Squamous cell carcinoma of skin 11/02/2014   in situ-Left shin (txpbx)   Squamous cell carcinoma of skin 12/15/2014   in situ-Left hand (txpbx), bowens-left side chest (txpbx)   Squamous cell carcinoma of skin 05/09/2015   in situ-left hand (txpbx)    Squamous cell carcinoma of skin 05/07/2016   in situ-left inner shin,ant, in situ-left inner shin, post, in situ-left outer forearm, in situ-right knuckle   Squamous cell carcinoma of skin 03/1302018   in situ-left shoulder (txpbx), in situ-right inner shin (txpbx), in situ-top of left foot (txpbx), KA- left forearm (txpbx)   Squamous cell carcinoma  of skin 12/02/2016   in situ-left inner shin (txpbx)   Squamous cell carcinoma of skin 03/04/2017   in situ-left outer sup, shin (txpbx), in situ- right 2nd knuckle finger (txpbx), in situ- Left wrist (txpbx)   Squamous cell carcinoma of skin 04/06/2018   in situ-above left knee inner (txpbx)   Squamous cell carcinoma of skin 04/21/2018   in situ-right top hand (txpbx)   Squamous cell carcinoma of skin 07/08/2018   in situ-right lower inner shin (txpbx)   Squamous cell carcinoma of skin 04/27/2019   in situ- Left neck(CX35FU), In situ- right neck (CX35FU)   Urinary retention    sees Dr.Wrenn about 2 times a yr   Past Surgical History:  Procedure Laterality Date   [place]     with TAH   BACK SURGERY     several   BREAST BIOPSY Right    Results were negative   BROW LIFT Bilateral 10/02/2016    Procedure: BILATERAL LOWER LID BLEPHAROPLASTY;  Surgeon: Clista Bernhardt, MD;  Location: [place];  Service: Plastics;  Laterality: Bilateral;   CARPAL TUNNEL RELEASE Right    cataracts     CERVICAL FUSION     Dr Joya Salm   CERVICAL FUSION  12/31/2012   Dr Joya Salm   CHEST TUBE INSERTION     for traumatic Pneumothorax   COLONOSCOPY  07/16/2010   normal    COLONOSCOPY W/ POLYPECTOMY  1997   negative since; Dr Deatra Ina   ESOPHAGOGASTRODUODENOSCOPY  07/16/2010   normal   eye lid raise     EYE SURGERY Bilateral    cataracts removed - /w IOL   JOINT REPLACEMENT     LUMBAR FUSION     Dr Joya Salm   NASAL SINUS SURGERY     POSTERIOR CERVICAL FUSION/FORAMINOTOMY N/A 12/31/2012   Procedure: POSTERIOR LATERAL CERVICAL FUSION/FORAMINOTOMY LEVEL 1 CERVICAL THREE-FOUR WITH LATERAL MASS SCREWS;  Surgeon: Floyce Stakes, MD;  Location: [place] [facility];  Service: Neurosurgery;  Laterality: N/A;   PTOSIS REPAIR Bilateral 10/02/2016   Procedure: INTERNAL PTOSIS REPAIR;  Surgeon: Clista Bernhardt, MD;  Location: [place];  Service: Plastics;  Laterality: Bilateral;   SKIN BIOPSY     THYROID SURGERY     R lobe removed- 1972, has grown back - CT last done- 2018   TOTAL HIP ARTHROPLASTY  12/22/2011   Procedure: TOTAL HIP ARTHROPLASTY;  Surgeon: Kerin Salen, MD;  Location: [place];  Service: Orthopedics;  Laterality: Left;   TOTAL SHOULDER ARTHROPLASTY     TUBAL LIGATION     UPPER GASTROINTESTINAL ENDOSCOPY  2012   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   Patient Active Problem List   Diagnosis Date Noted   Lumbar vertebral fracture, pathologic 09/20/2021   Degenerative spondylolisthesis 08/19/2021   Acute left lumbar radiculopathy 07/05/2021   Closed fracture of rib of right side with delayed healing 05/29/2021   Senile purpura ([place]) 12/19/2020   Restless leg 02/02/2020   Bilateral leg edema 01/11/2020   Cervical radiculopathy 06/28/2019   Carpal tunnel syndrome on both sides 06/28/2019   Right sided sciatica  04/14/2018   Trochanteric bursitis of right hip 04/14/2018   S/P cervical spinal fusion 11/10/2017   Dysphagia 10/26/2017   Gastroesophageal reflux disease 08/06/2017   Easy bruising 08/06/2017   Mid back pain 03/26/2017   Submandibular gland swelling 01/08/2017   High risk medication use 09/02/2016   Piriformis muscle pain 09/02/2016  Right hip pain 04/08/2016   Right knee pain 02/25/2016   Numbness 11/14/2014   Biceps tendon tear 10/10/2014   Insomnia 10/10/2014   Gait disturbance 06/13/2014   OP (osteoporosis) 01/18/2014   Other long term (current) drug therapy 01/18/2014   Hoarseness 12/29/2013   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 01/01/2013   Personal history of colonic polyps 07/03/2010   Nocturia 05/28/2010   THYROID NODULE 05/17/2008   Multiple sclerosis ([place]) 05/17/2008   Other fatigue 03/30/2007   Hyperlipidemia 08/20/2006   Essential hypertension 08/20/2006   Rheumatoid arthritis ([place]) 08/20/2006   CERVICAL CANCER, HX OF 08/20/2006   GASTRIC ULCER, HX OF 08/20/2006    REFERRING DIAG: 4th lumbar vertebrae fracture, neck pain  THERAPY DIAG:  Pathological fracture of lumbar vertebra, initial encounter  Cervicalgia  Muscle weakness (generalized)  Rationale for Evaluation and Treatment Rehabilitation  PERTINENT HISTORY: MS, RA, hx of skin cancer, L THA, posterior cervical fusion C3-4 2014, osteoporosis   PRECAUTIONS: released - ease into lifting and BTLS  SUBJECTIVE: States that she had the covid shot last week which meant she had to stop her arthritis medication. She reports flare ups throughout her body, but specifically her hands limiting her ability to perform her HEP. She reports most pain when standing.   Eval: States that she had back surgery for the first time with hardware placed in her back over 10 years ago. States her MD went in to fix original surgery earlier this year and was fine until about a week after the surgery where she started  having extreme radicular symptoms. Imaging demonstrated a compression fracture at L4. MD performed second surgery with larger fusion to help with fixation and has been in a lumbar brace since the surgery. As of last week she was allowed to come out of the brace to walk around the house and when she lays/sits down to rest.   Patient is primarily concerned with weakness in her legs. Her balance is off but it is off because of her MS. States she can currently stand for 5 minutes at a time  PAIN:  Are you having pain? Yes: NPRS scale: 2/10 Pain location: low back Pain description: ache Aggravating factors: too active  Relieving factors: heat  OBJECTIVE:    DIAGNOSTIC FINDINGS:  CT lumbar spine 09/12/21 FINDINGS: Segmentation: 5 lumbar type vertebrae.   Alignment: Approximately 6 mm anterolisthesis at L3-L4.   Vertebrae: Osseous demineralization. Postoperative changes are present with rods and pedicle screws at L3-L4. There is subsidence of a left-sided interbody spacer into L3. Subsidence right sided interbody spacer into fractured L4 vertebral body. Rods and pedicle screws are also present at L5-S1. Interbody spacers are present at L4-L5 and L5-S1. Laminectomies are present at the operative levels. Streak artifact associated with the hardware.     IMPRESSION: Known L4 fracture. Postoperative changes as described with intact hardware. There is subsidence of the L3-L4 interbody spacers into L3 and L4. Canal is not well evaluated at the fracture and operative levels due to artifact. There is probable bilateral foraminal narrowing at L4-L5 secondary to distortion from the fracture.       SCREENING FOR RED FLAGS: Bowel or bladder incontinence: No Spinal tumors: No Cauda equina syndrome: No Compression fracture: Yes: from 4/23 Abdominal aneurysm: No   COGNITION:           Overall cognitive status: Within functional limits for tasks assessed  SENSATION: WFL   MUSCLE LENGTH: Hamstrings: Right 0 deg; Left 20 deg     POSTURE: rounded shoulders, forward head, posterior pelvic tilt, and flexed trunk      LUMBAR ROM: - not assessed             LE Measurements       Lower Extremity Right 01/21/2022 Left 01/21/2022    A/PROM MMT A/PROM MMT  Hip Flexion 120 4 120 4-  Hip Extension          Hip Abduction          Hip Adduction          Hip Internal rotation 45   55    Hip External rotation 45   45    Knee Flexion   4-   3+  Knee Extension   4   4-  Ankle Dorsiflexion   4   4-  Ankle Plantarflexion          Ankle Inversion          Ankle Eversion           (Blank rows = not tested)            * pain     LUMBAR SPECIAL TESTS:  Slump test - neg   FUNCTIONAL TESTS:  STS - can get out of chair without UE support but favors R LE Standing - leans to the right                       Unable to stand on either leg in SLS or in tandem (patient reports this is 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) GAIT: Distance walked: 50 feet Assistive device utilized: None Level of assistance: Complete Independence Comments: with lumbar brace on- stiff, limited hip extension, leans to the right     TODAY'S TREATMENT  02/17/2022  Therapeutic Exercise:    Aerobic: Supine: bent knee fall outs 2 minutes , bent knee fall ins 2 minutes , PPT 2 minutes, LTR to tolerance - 2 minutes, bridges 3x10, SLR x20 Bilat, ball squeezes x20,  Prone:    Seated:    Standing:  Neuromuscular Re-education: Manual Therapy: Therapeutic Activity: Self Care: Trigger Point Dry Needling:  Modalities: Moist heat below back for entire session.      PATIENT EDUCATION:  Education details: on current presentation, on HEP, on clinical outcomes score and POC Person educated: Patient Education method: Explanation, Demonstration, and Handouts Education comprehension: verbalized understanding    HOME EXERCISE PROGRAM: [id number]   ASSESSMENT:   CLINICAL  IMPRESSION: Pt reports to PT with increased pain due to arthritis flare up's.  Session with focus on LE strengthening. Tolerated exercises in supine with moist heat below back for entire session. Cued patient for rest break as needed. No pain noted during session. Added all new exercises to HEP. Overall patient is doing well and would continue to benefit from skilled PT at this time.    OBJECTIVE IMPAIRMENTS Abnormal gait, decreased activity tolerance, decreased balance, decreased knowledge of use of DME, decreased mobility, difficulty walking, decreased ROM, decreased strength, improper body mechanics, postural dysfunction, and pain.    ACTIVITY LIMITATIONS carrying, lifting, bending, sitting, standing, squatting, stairs, bed mobility, and locomotion level   PARTICIPATION LIMITATIONS: meal prep, cleaning, and community activity   PERSONAL FACTORS Age, Fitness, and 3+ comorbidities: MS, x4 lx surgeries, osteoporosis  are also affecting patient's functional outcome.    REHAB POTENTIAL: Fair given patients history  CLINICAL DECISION MAKING: Evolving/moderate complexity   EVALUATION COMPLEXITY: Moderate     GOALS: Goals reviewed with patient?  yes   SHORT TERM GOALS:   Patient will be independent in self management strategies to improve quality of life and functional outcomes. Baseline: new program Target date: 02/25/2022 Goal status: INITIAL   2.  Patient will report at least 50% improvement in overall symptoms and/or function to demonstrate improved functional mobility Baseline: 0% Target date: 02/25/2022 Goal status: INITIAL   3.  Patient will be able to stand for at least 15 minutes with brace to demonstrate improved standing endurance Baseline: 5 minutes Target date: 02/25/2022 Goal status: INITIAL           LONG TERM GOALS:   Patient will report at least 75% improvement in overall symptoms and/or function to demonstrate improved functional mobility Baseline: 0% Target  date: 04/01/2022 Goal status: INITIAL   2.  Patient will be able to demonstrate at least 4/5 MMT in bilateral legs to demonstrate improved strength. Baseline: see above Target date: 04/01/2022 Goal status: INITIAL   3.  Patient will be able to stand for at least 25 minutes at a time to improve ability to cook meals Baseline: 5 minutes Target date: 04/01/2022 Goal status: INITIAL     PLAN: PT FREQUENCY: 2x/week   PT DURATION: 10 weeks   PLANNED INTERVENTIONS: Therapeutic exercises, Therapeutic activity, Neuromuscular re-education, Balance training, Gait training, Patient/Family education, Self Care, Joint mobilization, Joint manipulation, Orthotic/Fit training, Aquatic Therapy, Dry Needling, Cognitive remediation, Electrical stimulation, Cryotherapy, Moist heat, Ultrasound, Ionotophoresis '4mg'$ /ml Dexamethasone, and Manual therapy.   PLAN FOR NEXT SESSION: isometrics, core activation, standing exercises/endurance, LE strengthening.    Rudi Heap PT, DPT 02/17/22  3:10 PM

## 2022-02-19 ENCOUNTER — Encounter: Payer: Medicare Other | Admitting: Physical Therapy

## 2022-02-20 ENCOUNTER — Ambulatory Visit: Payer: Medicare Other | Admitting: Physical Therapy

## 2022-02-20 ENCOUNTER — Encounter: Payer: Self-pay | Admitting: Physical Therapy

## 2022-02-20 DIAGNOSIS — M8448XA Pathological fracture, other site, initial encounter for fracture: Secondary | ICD-10-CM

## 2022-02-20 DIAGNOSIS — M6281 Muscle weakness (generalized): Secondary | ICD-10-CM

## 2022-02-20 DIAGNOSIS — R262 Difficulty in walking, not elsewhere classified: Secondary | ICD-10-CM | POA: Diagnosis not present

## 2022-02-20 NOTE — Therapy (Signed)
OUTPATIENT PHYSICAL THERAPY TREATMENT NOTE   Patient Name: Sherri [name] MRN: [id number] DOB:1940-07-26, 81 y.o., female Today's Date: 02/20/2022  PCP: Binnie Rail MD REFERRING PROVIDER:  Viona Gilmore, NP  END OF SESSION:   PT End of Session - 02/20/22 1428     Visit Number 5    Number of Visits 20    Date for PT Re-Evaluation 04/01/22    Progress Note Due on Visit 10    PT Start Time 1432    PT Stop Time 1510    PT Time Calculation (min) 38 min    Activity Tolerance Patient tolerated treatment well    Behavior During Therapy [facility] for tasks assessed/performed              Past Medical History:  Diagnosis Date   Anxiety    takes Valium daily as needed   Basal cell carcinoma 01/21/1988   left nostril (MOHS), sup-right calf (CX35FU)   Basal cell carcinoma 03/22/1996   right calf (CX35FU)   Basal cell carcinoma 03/31/1995   left wing nose   Bruises easily    d/t meds   Cancer ([place])    basal cell ca, in situ- uterine    Cataracts, bilateral    removed bilateral   Chronic back pain    Dizziness    r/t to meds   GERD (gastroesophageal reflux disease)    no meds on a regular basis but will take Tums if needed   Headache(784.0)    r/t neck issues   History of bronchitis 6-72yr ago   History of colon polyps    benign   HOH (hard of hearing)    wears hearing aids   Hyperlipidemia    takes Atorvastatin on Mondays and Fridays   Hypertension    Joint pain    Joint swelling    Multiple sclerosis ([place]    Neuromuscular disorder ([place]    Dr. RPark Liter [facility], follows [facility].   Nocturia    PONV (postoperative nausea and vomiting)    trouble urinating after surgery in 2014   Postoperative nausea and vomiting 01/11/2019   Rheumatoid arthritis ([place]    Dr VJani Gravelweekly, RA- hands- knees- feet    Rheumatoid arthritis(714.0)    Dr DEstanislado Pandytakes XMorrie Sheldondaily   Right wrist fracture    Skin cancer    Squamous cell carcinoma of skin  11/02/2001   in situ-right knee (cx332f   Squamous cell carcinoma of skin 11/12/2011   in situ-left forearm (CX35FU), in situ-left foot (CX35FU)   Squamous cell carcinoma of skin 01/22/2012   in situ-left lower forearm (txpbx)   Squamous cell carcinoma of skin 11/17/2012   right shin (txpbx)   Squamous cell carcinoma of skin 10/28/2013   in situ-right shoulder (CX35FU)   Squamous cell carcinoma of skin 04/28/2014   well diff-right shin (txpbx)   Squamous cell carcinoma of skin 11/02/2014   in situ-Left shin (txpbx)   Squamous cell carcinoma of skin 12/15/2014   in situ-Left hand (txpbx), bowens-left side chest (txpbx)   Squamous cell carcinoma of skin 05/09/2015   in situ-left hand (txpbx)    Squamous cell carcinoma of skin 05/07/2016   in situ-left inner shin,ant, in situ-left inner shin, post, in situ-left outer forearm, in situ-right knuckle   Squamous cell carcinoma of skin 03/1302018   in situ-left shoulder (txpbx), in situ-right inner shin (txpbx), in situ-top of left foot (txpbx), KA- left forearm (txpbx)   Squamous cell carcinoma  of skin 12/02/2016   in situ-left inner shin (txpbx)   Squamous cell carcinoma of skin 03/04/2017   in situ-left outer sup, shin (txpbx), in situ- right 2nd knuckle finger (txpbx), in situ- Left wrist (txpbx)   Squamous cell carcinoma of skin 04/06/2018   in situ-above left knee inner (txpbx)   Squamous cell carcinoma of skin 04/21/2018   in situ-right top hand (txpbx)   Squamous cell carcinoma of skin 07/08/2018   in situ-right lower inner shin (txpbx)   Squamous cell carcinoma of skin 04/27/2019   in situ- Left neck(CX35FU), In situ- right neck (CX35FU)   Urinary retention    sees Dr.Wrenn about 2 times a yr   Past Surgical History:  Procedure Laterality Date   Nowthen     with TAH   BACK SURGERY     several   BREAST BIOPSY Right    Results were negative   BROW LIFT Bilateral 10/02/2016    Procedure: BILATERAL LOWER LID BLEPHAROPLASTY;  Surgeon: Clista Bernhardt, MD;  Location: [place];  Service: Plastics;  Laterality: Bilateral;   CARPAL TUNNEL RELEASE Right    cataracts     CERVICAL FUSION     Dr Joya Salm   CERVICAL FUSION  12/31/2012   Dr Joya Salm   CHEST TUBE INSERTION     for traumatic Pneumothorax   COLONOSCOPY  07/16/2010   normal    COLONOSCOPY W/ POLYPECTOMY  1997   negative since; Dr Deatra Ina   ESOPHAGOGASTRODUODENOSCOPY  07/16/2010   normal   eye lid raise     EYE SURGERY Bilateral    cataracts removed - /w IOL   JOINT REPLACEMENT     LUMBAR FUSION     Dr Joya Salm   NASAL SINUS SURGERY     POSTERIOR CERVICAL FUSION/FORAMINOTOMY N/A 12/31/2012   Procedure: POSTERIOR LATERAL CERVICAL FUSION/FORAMINOTOMY LEVEL 1 CERVICAL THREE-FOUR WITH LATERAL MASS SCREWS;  Surgeon: Floyce Stakes, MD;  Location: [place] [facility];  Service: Neurosurgery;  Laterality: N/A;   PTOSIS REPAIR Bilateral 10/02/2016   Procedure: INTERNAL PTOSIS REPAIR;  Surgeon: Clista Bernhardt, MD;  Location: [place];  Service: Plastics;  Laterality: Bilateral;   SKIN BIOPSY     THYROID SURGERY     R lobe removed- 1972, has grown back - CT last done- 2018   TOTAL HIP ARTHROPLASTY  12/22/2011   Procedure: TOTAL HIP ARTHROPLASTY;  Surgeon: Kerin Salen, MD;  Location: [place];  Service: Orthopedics;  Laterality: Left;   TOTAL SHOULDER ARTHROPLASTY     TUBAL LIGATION     UPPER GASTROINTESTINAL ENDOSCOPY  2012   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   Patient Active Problem List   Diagnosis Date Noted   Lumbar vertebral fracture, pathologic 09/20/2021   Degenerative spondylolisthesis 08/19/2021   Acute left lumbar radiculopathy 07/05/2021   Closed fracture of rib of right side with delayed healing 05/29/2021   Senile purpura ([place]) 12/19/2020   Restless leg 02/02/2020   Bilateral leg edema 01/11/2020   Cervical radiculopathy 06/28/2019   Carpal tunnel syndrome on both sides 06/28/2019   Right sided sciatica  04/14/2018   Trochanteric bursitis of right hip 04/14/2018   S/P cervical spinal fusion 11/10/2017   Dysphagia 10/26/2017   Gastroesophageal reflux disease 08/06/2017   Easy bruising 08/06/2017   Mid back pain 03/26/2017   Submandibular gland swelling 01/08/2017   High risk medication use 09/02/2016   Piriformis muscle pain 09/02/2016  Right hip pain 04/08/2016   Right knee pain 02/25/2016   Numbness 11/14/2014   Biceps tendon tear 10/10/2014   Insomnia 10/10/2014   Gait disturbance 06/13/2014   OP (osteoporosis) 01/18/2014   Other long term (current) drug therapy 01/18/2014   Hoarseness 12/29/2013   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 01/01/2013   Personal history of colonic polyps 07/03/2010   Nocturia 05/28/2010   THYROID NODULE 05/17/2008   Multiple sclerosis ([place]) 05/17/2008   Other fatigue 03/30/2007   Hyperlipidemia 08/20/2006   Essential hypertension 08/20/2006   Rheumatoid arthritis ([place]) 08/20/2006   CERVICAL CANCER, HX OF 08/20/2006   GASTRIC ULCER, HX OF 08/20/2006    REFERRING DIAG: 4th lumbar vertebrae fracture, neck pain  THERAPY DIAG:  Pathological fracture of lumbar vertebra, initial encounter  Muscle weakness (generalized)  Difficulty in walking, not elsewhere classified  Rationale for Evaluation and Treatment Rehabilitation  PERTINENT HISTORY: MS, RA, hx of skin cancer, L THA, posterior cervical fusion C3-4 2014, osteoporosis   PRECAUTIONS: released - ease into lifting and BTLS  SUBJECTIVE: States that she that she has had a flare up of her arthritis. States her husband fell last night and she has just is much going on.  Eval: States that she had back surgery for the first time with hardware placed in her back over 10 years ago. States her MD went in to fix original surgery earlier this year and was fine until about a week after the surgery where she started having extreme radicular symptoms. Imaging demonstrated a compression fracture at L4.  MD performed second surgery with larger fusion to help with fixation and has been in a lumbar brace since the surgery. As of last week she was allowed to come out of the brace to walk around the house and when she lays/sits down to rest.   Patient is primarily concerned with weakness in her legs. Her balance is off but it is off because of her MS. States she can currently stand for 5 minutes at a time  PAIN:  Are you having pain? Yes: NPRS scale: 6/10 Pain location: low back and all over and under left chest/breast Pain description: ache Aggravating factors: too active  Relieving factors: heat  OBJECTIVE:    DIAGNOSTIC FINDINGS:  CT lumbar spine 09/12/21 FINDINGS: Segmentation: 5 lumbar type vertebrae.   Alignment: Approximately 6 mm anterolisthesis at L3-L4.   Vertebrae: Osseous demineralization. Postoperative changes are present with rods and pedicle screws at L3-L4. There is subsidence of a left-sided interbody spacer into L3. Subsidence right sided interbody spacer into fractured L4 vertebral body. Rods and pedicle screws are also present at L5-S1. Interbody spacers are present at L4-L5 and L5-S1. Laminectomies are present at the operative levels. Streak artifact associated with the hardware.     IMPRESSION: Known L4 fracture. Postoperative changes as described with intact hardware. There is subsidence of the L3-L4 interbody spacers into L3 and L4. Canal is not well evaluated at the fracture and operative levels due to artifact. There is probable bilateral foraminal narrowing at L4-L5 secondary to distortion from the fracture.       SCREENING FOR RED FLAGS: Bowel or bladder incontinence: No Spinal tumors: No Cauda equina syndrome: No Compression fracture: Yes: from 4/23 Abdominal aneurysm: No   COGNITION:           Overall cognitive status: Within functional limits for tasks assessed  SENSATION: WFL   MUSCLE LENGTH: Hamstrings: Right 0  deg; Left 20 deg     POSTURE: rounded shoulders, forward head, posterior pelvic tilt, and flexed trunk      LUMBAR ROM: - not assessed             LE Measurements       Lower Extremity Right 01/21/2022 Left 01/21/2022    A/PROM MMT A/PROM MMT  Hip Flexion 120 4 120 4-  Hip Extension          Hip Abduction          Hip Adduction          Hip Internal rotation 45   55    Hip External rotation 45   45    Knee Flexion   4-   3+  Knee Extension   4   4-  Ankle Dorsiflexion   4   4-  Ankle Plantarflexion          Ankle Inversion          Ankle Eversion           (Blank rows = not tested)            * pain     LUMBAR SPECIAL TESTS:  Slump test - neg   FUNCTIONAL TESTS:  STS - can get out of chair without UE support but favors R LE Standing - leans to the right                       Unable to stand on either leg in SLS or in tandem (patient reports this is 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) GAIT: Distance walked: 50 feet Assistive device utilized: None Level of assistance: Complete Independence Comments: with lumbar brace on- stiff, limited hip extension, leans to the right     TODAY'S TREATMENT  02/20/2022  Therapeutic Exercise:    Aerobic: Supine: shoulder flexion with posterior pelvic tilt 2x15 5" holds    Seated:    Standing:  Neuromuscular Re-education: long exhale and inhale - tactile cues for belly breath and rib decompression - 15 minutes Manual Therapy: STM  and vibration from percussion gun (with cushion) left trunk and right hand Therapeutic Activity: Self Care: Trigger Point Dry Needling:  Modalities: Moist heat below back for entire session.      PATIENT EDUCATION:  Education details: on  rationale for breathing interventions, on HEP Person educated: Patient Education method: Explanation, Demonstration, and Handouts Education comprehension: verbalized understanding    HOME EXERCISE PROGRAM: [id number]   ASSESSMENT:   CLINICAL IMPRESSION: Tolerated  percussion gun well, used this for vibration and patient reported reduced pain. Educated patient on breathing exercises and this was also tolerated well. Added shoulder flexion exercise to HEP. Will continue with current POC as tolerated.    OBJECTIVE IMPAIRMENTS Abnormal gait, decreased activity tolerance, decreased balance, decreased knowledge of use of DME, decreased mobility, difficulty walking, decreased ROM, decreased strength, improper body mechanics, postural dysfunction, and pain.    ACTIVITY LIMITATIONS carrying, lifting, bending, sitting, standing, squatting, stairs, bed mobility, and locomotion level   PARTICIPATION LIMITATIONS: meal prep, cleaning, and community activity   PERSONAL FACTORS Age, Fitness, and 3+ comorbidities: MS, x4 lx surgeries, osteoporosis  are also affecting patient's functional outcome.    REHAB POTENTIAL: Fair given patients history   CLINICAL DECISION MAKING: Evolving/moderate complexity   EVALUATION COMPLEXITY: Moderate     GOALS: Goals reviewed with patient?  yes  SHORT TERM GOALS:   Patient will be independent in self management strategies to improve quality of life and functional outcomes. Baseline: new program Target date: 02/25/2022 Goal status: INITIAL   2.  Patient will report at least 50% improvement in overall symptoms and/or function to demonstrate improved functional mobility Baseline: 0% Target date: 02/25/2022 Goal status: INITIAL   3.  Patient will be able to stand for at least 15 minutes with brace to demonstrate improved standing endurance Baseline: 5 minutes Target date: 02/25/2022 Goal status: INITIAL           LONG TERM GOALS:   Patient will report at least 75% improvement in overall symptoms and/or function to demonstrate improved functional mobility Baseline: 0% Target date: 04/01/2022 Goal status: INITIAL   2.  Patient will be able to demonstrate at least 4/5 MMT in bilateral legs to demonstrate improved  strength. Baseline: see above Target date: 04/01/2022 Goal status: INITIAL   3.  Patient will be able to stand for at least 25 minutes at a time to improve ability to cook meals Baseline: 5 minutes Target date: 04/01/2022 Goal status: INITIAL     PLAN: PT FREQUENCY: 2x/week   PT DURATION: 10 weeks   PLANNED INTERVENTIONS: Therapeutic exercises, Therapeutic activity, Neuromuscular re-education, Balance training, Gait training, Patient/Family education, Self Care, Joint mobilization, Joint manipulation, Orthotic/Fit training, Aquatic Therapy, Dry Needling, Cognitive remediation, Electrical stimulation, Cryotherapy, Moist heat, Ultrasound, Ionotophoresis '4mg'$ /ml Dexamethasone, and Manual therapy.   PLAN FOR NEXT SESSION: isometrics, core activation, standing exercises/endurance, LE strengthening.    3:29 PM, 02/20/22 [name], DPT Physical Therapy with [facility]

## 2022-02-21 ENCOUNTER — Ambulatory Visit (INDEPENDENT_AMBULATORY_CARE_PROVIDER_SITE_OTHER): Payer: Medicare Other

## 2022-02-21 VITALS — BP 120/46 | HR 80 | Temp 97.9°F | Ht 61.0 in | Wt 106.0 lb

## 2022-02-21 DIAGNOSIS — Z0000 Encounter for general adult medical examination without abnormal findings: Secondary | ICD-10-CM | POA: Diagnosis not present

## 2022-02-21 NOTE — Progress Notes (Signed)
Subjective:   Sherri [name] is a 81 y.o. female who presents for Medicare Annual (Subsequent) preventive examination.  I connected with Sherri today by telephone and verified that I am speaking with the correct person using two identifiers. I discussed the limitations, risks, security and privacy concerns of performing an evaluation and management service by telephone and the availability of in person appointments. I also discussed with the patient that there may be a patient responsible charge related to this service. The patient expressed understanding and agreed to proceed. Location patient: home Location provider: Pietro Cassis Persons participating in the visit: [place] and Jillene Bucks, [place].  Time Spent with patient on telephone encounter: 20 mins  Review of Systems    No ROS. Medicare Wellness Telephone Visit. Additional risk factors are reflected in social history. Cardiac Risk Factors include: advanced age (>21mn, >>29women);sedentary lifestyle     Objective:    Today's Vitals   02/21/22 1550  PainSc: 5    There is no height or weight on file to calculate BMI.     02/21/2022    4:29 PM 09/20/2021    1:29 PM 08/15/2021    1:18 PM 03/30/2021    9:07 PM 10/12/2020    2:30 PM 08/09/2020    1:56 PM 12/04/2019    1:10 PM  Advanced Directives  Does Patient Have a Medical Advance Directive? Yes Yes Yes Yes No Yes No  Type of AParamedicof AJuniata TerraceLiving will  HChapmanvilleLiving will [place]odiLiving will  Living will;Healthcare Power of Attorney   Does patient want to make changes to medical advance directive? No - Patient declined No - Patient declined No - Patient declined   No - Patient declined   Copy of [place]ittenangoin Chart? No - copy requested  No - copy requested   No - copy requested   Would patient like information on creating a medical advance directive?     No - Patient  declined  No - Patient declined    Current Medications (verified) Outpatient Encounter Medications as of 02/21/2022  Medication Sig   atorvastatin (LIPITOR) 10 MG tablet TAKE 1 TAB ON MONDAYS AND FRIDAY   Biotin 5000 MCG TABS Take 10,000 mcg by mouth every evening.   Calcium-Magnesium-Zinc (CAL-MAG-ZINC PO) Take 1 tablet by mouth in the morning.   Carboxymethylcellul-Glycerin (LUBRICATING EYE DROPS OP) Place 1 drop into both eyes daily as needed (dry eyes).   carvedilol (COREG) 6.25 MG tablet TAKE 1 TABLET BY MOUTH 2 TIMES DAILY WITH A MEAL.   cholecalciferol (VITAMIN D) 25 MCG (1000 UNIT) tablet Take 1,000 Units by mouth in the morning.   cyclobenzaprine (FLEXERIL) 10 MG tablet Take 1 tablet (10 mg total) by mouth 3 (three) times daily as needed for muscle spasms.   denosumab (PROLIA) 60 MG/ML SOSY injection Inject 60 mg into the skin every 6 (six) months. Deliver to rheum: [address] [place] [id number] Appt on 01/09/22   diazepam (VALIUM) 5 MG tablet TAKE 1 TABLET BY MOUTH AT BEDTIME AS NEEDED FOR MUSCLE SPASMS.   diclofenac sodium (VOLTAREN) 1 % GEL APPLY 3 GRAMS TO 3 LARGE JOINTS 3 TIMES A DAY AS NEEDED (Patient taking differently: Apply 2-3 g topically 3 (three) times daily as needed (joint pain).)   famotidine (PEPCID) 10 MG tablet Take 10 mg by mouth in the morning.   folic acid (FOLVITE) 1 MG tablet Take 1 tablet (  1 mg total) by mouth daily.   furosemide (LASIX) 20 MG tablet TAKE 1 TABLET BY MOUTH EVERY DAY   methotrexate (RHEUMATREX) 2.5 MG tablet Take 6 tablets (15 mg total) by mouth once a week. Caution:Chemotherapy. Protect from light.   polyethylene glycol (MIRALAX / GLYCOLAX) 17 g packet Take 17 g by mouth every other day. In the morning.   predniSONE (DELTASONE) 5 MG tablet 1 tablet by mouth every morning   traMADol (ULTRAM) 50 MG tablet TAKE 1 TABLET (50 MG TOTAL) BY MOUTH 3 (THREE) TIMES DAILY AS NEEDED FOR SEVERE PAIN.   TUBERCULIN SYR 1CC/27GX1/2" 27G X  1/2" 1 ML MISC Use 1 syringe once weekly to inject methotrexate.   Upadacitinib ER 15 MG TB24 TAKE 1 TABLET (15 MG) BY MOUTH DAILY. (Patient taking differently: Take 15 mg by mouth daily.)   [DISCONTINUED] HYDROcodone-acetaminophen (NORCO) 10-325 MG tablet Take 1 tablet by mouth every 4 (four) hours as needed for moderate pain ((score 4 to 6)).   No facility-administered encounter medications on file as of 02/21/2022.    Allergies (verified) Darvon [propoxyphene hcl], Demerol [meperidine], Penicillins, Imuran [azathioprine], Sulfa antibiotics, Arava [leflunomide], Lipitor [atorvastatin], and Vytorin [ezetimibe-simvastatin]   History: Past Medical History:  Diagnosis Date   Anxiety    takes Valium daily as needed   Basal cell carcinoma 01/21/1988   left nostril (MOHS), sup-right calf (CX35FU)   Basal cell carcinoma 03/22/1996   right calf (CX35FU)   Basal cell carcinoma 03/31/1995   left wing nose   Bruises easily    d/t meds   Cancer (HCC)    basal cell ca, in situ- uterine    Cataracts, bilateral    removed bilateral   Chronic back pain    Dizziness    r/t to meds   GERD (gastroesophageal reflux disease)    no meds on a regular basis but will take Tums if needed   Headache(784.0)    r/t neck issues   History of bronchitis 6-77yr ago   History of colon polyps    benign   HOH (hard of hearing)    wears hearing aids   Hyperlipidemia    takes Atorvastatin on Mondays and Fridays   Hypertension    Joint pain    Joint swelling    Multiple sclerosis (H[place]ville    Neuromuscular disorder ([place]    Dr. RPark Liter [facility], follows M.S.   Nocturia    PONV (postoperative nausea and vomiting)    trouble urinating after surgery in 2014   Postoperative nausea and vomiting 01/11/2019   Rheumatoid arthritis (HBall    Dr VJani Gravelweekly, RA- hands- knees- feet    Rheumatoid arthritis(714.0)    Dr DEstanislado Pandytakes XMorrie Sheldondaily   Right wrist fracture    Skin cancer     Squamous cell carcinoma of skin 11/02/2001   in situ-right knee (cx376f   Squamous cell carcinoma of skin 11/12/2011   in situ-left forearm (CX35FU), in situ-left foot (CX35FU)   Squamous cell carcinoma of skin 01/22/2012   in situ-left lower forearm (txpbx)   Squamous cell carcinoma of skin 11/17/2012   right shin (txpbx)   Squamous cell carcinoma of skin 10/28/2013   in situ-right shoulder (CX35FU)   Squamous cell carcinoma of skin 04/28/2014   well diff-right shin (txpbx)   Squamous cell carcinoma of skin 11/02/2014   in situ-Left shin (txpbx)   Squamous cell carcinoma of skin 12/15/2014   in situ-Left hand (txpbx), bowens-left side chest (txpbx)  Squamous cell carcinoma of skin 05/09/2015   in situ-left hand (txpbx)    Squamous cell carcinoma of skin 05/07/2016   in situ-left inner shin,ant, in situ-left inner shin, post, in situ-left outer forearm, in situ-right knuckle   Squamous cell carcinoma of skin 03/1302018   in situ-left shoulder (txpbx), in situ-right inner shin (txpbx), in situ-top of left foot (txpbx), KA- left forearm (txpbx)   Squamous cell carcinoma of skin 12/02/2016   in situ-left inner shin (txpbx)   Squamous cell carcinoma of skin 03/04/2017   in situ-left outer sup, shin (txpbx), in situ- right 2nd knuckle finger (txpbx), in situ- Left wrist (txpbx)   Squamous cell carcinoma of skin 04/06/2018   in situ-above left knee inner (txpbx)   Squamous cell carcinoma of skin 04/21/2018   in situ-right top hand (txpbx)   Squamous cell carcinoma of skin 07/08/2018   in situ-right lower inner shin (txpbx)   Squamous cell carcinoma of skin 04/27/2019   in situ- Left neck(CX35FU), In situ- right neck (CX35FU)   Urinary retention    sees Dr.Wrenn about 2 times a yr   Past Surgical History:  Procedure Laterality Date   [place]     with TAH   BACK SURGERY     several   BREAST BIOPSY Right    Results were negative   BROW  LIFT Bilateral 10/02/2016   Procedure: BILATERAL LOWER LID BLEPHAROPLASTY;  Surgeon: Clista Bernhardt, MD;  Location: [facility];  Service: Plastics;  Laterality: Bilateral;   CARPAL TUNNEL RELEASE Right    cataracts     CERVICAL FUSION     Dr Joya Salm   CERVICAL FUSION  12/31/2012   Dr Joya Salm   CHEST TUBE INSERTION     for traumatic Pneumothorax   COLONOSCOPY  07/16/2010   normal    COLONOSCOPY W/ POLYPECTOMY  1997   negative since; Dr Deatra Ina   ESOPHAGOGASTRODUODENOSCOPY  07/16/2010   normal   eye lid raise     EYE SURGERY Bilateral    cataracts removed - /w IOL   JOINT REPLACEMENT     LUMBAR FUSION     Dr Joya Salm   NASAL SINUS SURGERY     POSTERIOR CERVICAL FUSION/FORAMINOTOMY N/A 12/31/2012   Procedure: POSTERIOR LATERAL CERVICAL FUSION/FORAMINOTOMY LEVEL 1 CERVICAL THREE-FOUR WITH LATERAL MASS SCREWS;  Surgeon: Floyce Stakes, MD;  Location: [facility];  Service: Neurosurgery;  Laterality: N/A;   PTOSIS REPAIR Bilateral 10/02/2016   Procedure: INTERNAL PTOSIS REPAIR;  Surgeon: Clista Bernhardt, MD;  Location: [facility];  Service: Plastics;  Laterality: Bilateral;   SKIN BIOPSY     THYROID SURGERY     R lobe removed- 1972, has grown back - CT last done- 2018   TOTAL HIP ARTHROPLASTY  12/22/2011   Procedure: TOTAL HIP ARTHROPLASTY;  Surgeon: Kerin Salen, MD;  Location: [facility];  Service: Orthopedics;  Laterality: Left;   TOTAL SHOULDER ARTHROPLASTY     TUBAL LIGATION     UPPER GASTROINTESTINAL ENDOSCOPY  2012   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   Family History  Problem Relation Age of Onset   Cancer Mother        pancreatic   Diabetes Mother    Pancreatic cancer Mother    Heart disease Father        Rheumatic   Cancer Father        ? stomach   Stomach cancer Father  Cancer Sister        stomach   Stomach cancer Sister    Cancer Maternal Aunt        X 4; ? primary   Heart disease Paternal Aunt    Cancer Maternal Grandmother        cervical   Colon cancer Other         Aunts   Multiple sclerosis Daughter    Hypertension Neg Hx    Stroke Neg Hx    Esophageal cancer Neg Hx    Rectal cancer Neg Hx    Social History   Socioeconomic History   Marital status: Married    Spouse name: Quita Skye   Number of children: 2   Years of education: Not on file   Highest education level: Not on file  Occupational History   Occupation: retired  Tobacco Use   Smoking status: Former    Packs/day: 2.50    Years: 20.00    Total pack years: 50.00    Types: Cigarettes    Quit date: 05/26/1981    Years since quitting: 40.7   Smokeless tobacco: Never   Tobacco comments:    Smoked 1963-1983, up to 2.5 ppd  Vaping Use   Vaping Use: Never used  Substance and Sexual Activity   Alcohol use: Yes    Alcohol/week: 14.0 standard drinks of alcohol    Types: 14 Cans of beer per week    Comment: 2% beer   Drug use: Never   Sexual activity: Yes    Birth control/protection: Surgical    Comment: Hysterectomy  Other Topics Concern   Not on file  Social History Narrative   Not on file   Social Determinants of Health   Financial Resource Strain: Low Risk  (02/21/2022)   Overall Financial Resource Strain (CARDIA)    Difficulty of Paying Living Expenses: Not hard at all  Food Insecurity: No Food Insecurity (02/21/2022)   Hunger Vital Sign    Worried About Running Out of Food in the Last Year: Never true    Ran Out of Food in the Last Year: Never true  Transportation Needs: No Transportation Needs (02/21/2022)   PRAPARE - Hydrologist (Medical): No    Lack of Transportation (Non-Medical): No  Physical Activity: Inactive (02/21/2022)   Exercise Vital Sign    Days of Exercise per Week: 0 days    Minutes of Exercise per Session: 0 min  Stress: Stress Concern Present (02/21/2022)   [place]    Feeling of Stress : Rather much  Social Connections: Moderately Isolated (02/21/2022)    Social Connection and Isolation Panel [NHANES]    Frequency of Communication with Friends and Family: More than three times a week    Frequency of Social Gatherings with Friends and Family: Twice a week    Attends Religious Services: Never    Marine scientist or Organizations: No    Attends Archivist Meetings: Never    Marital Status: Married    Tobacco Counseling Counseling given: Not Answered Tobacco comments: Smoked [phone], up to 2.5 ppd   Clinical Intake:  Pre-visit preparation completed: Yes  Pain : 0-10 Pain Score: 5  Pain Type: Chronic pain Pain Location: Shoulder (location varies) Pain Orientation: Left Pain Descriptors / Indicators: Constant, Aching (arthritis) Pain Onset: More than a month ago Pain Frequency: Constant Pain Relieving Factors: Tramadol  Pain Relieving Factors: Tramadol  Nutritional Risks:  None Diabetes: No  How often do you need to have someone help you when you read instructions, pamphlets, or other written materials from your doctor or pharmacy?: 1 - Never What is the last grade level you completed in school?: College Degree  Diabetic?no  Interpreter Needed?: No  Information entered by :: Jillene Bucks, CMA   Activities of Daily Living    02/21/2022    4:31 PM 08/15/2021    1:23 PM  In your present state of health, do you have any difficulty performing the following activities:  Hearing? 1   Comment wears hearing aids   Vision? 0   Difficulty concentrating or making decisions? 0   Comment some   Walking or climbing stairs? 0   Dressing or bathing? 0   Doing errands, shopping? 0 0  Preparing Food and eating ? N   Using the Toilet? N   In the past six months, have you accidently leaked urine? N   Do you have problems with loss of bowel control? N   Managing your Medications? N   Managing your Finances? N   Housekeeping or managing your Housekeeping? N     Patient Care Team: Binnie Rail, MD as PCP -  General (Internal Medicine) Sater, Nanine Means, MD (Neurology) Bo Merino, MD as Consulting Physician (Rheumatology) Charlton Haws, [facility] as Pharmacist (Pharmacist) Warren Danes, PA-C as Physician Assistant (Dermatology)  Indicate any recent Medical Services you may have received from other than [facility] providers in the past year (date may be approximate).     Assessment:   This is a routine wellness examination for Sherri.  Hearing/Vision screen Patient has hearing difficulty and wears hearing aids. Patient does not wear any corrective lenses/contacts.  Dietary issues and exercise activities discussed: Current Exercise Habits: The patient does not participate in regular exercise at present, Exercise limited by: None identified   Goals Addressed             This Visit's Progress    patient stated       Get back into walking.       Depression Screen    02/21/2022    4:15 PM 02/21/2022    4:12 PM 07/08/2021   12:59 PM 08/10/2020    4:45 PM 08/09/2020    1:56 PM 10/26/2019    4:00 PM 12/16/2016    2:50 PM  PHQ 2/9 Scores  PHQ - 2 Score 0 0 0 0 0 0 0  PHQ- 9 Score    1   1    Fall Risk    02/21/2022    4:30 PM 07/08/2021   12:59 PM 08/09/2020    1:56 PM 10/26/2019    4:00 PM 12/16/2016    2:50 PM  [place] in the past year? 1 0 0 0 No  Number falls in past yr: 1 0 0 0   Injury with Fall? 1 0 0 0   Risk for fall due to : History of fall(s) No Fall Risks No Fall Risks  Impaired balance/gait  Follow up Falls evaluation completed Falls evaluation completed Falls evaluation completed Falls evaluation completed     [place]:  Any stairs in or around the home? Yes  If so, are there any without handrails? No  Home free of loose throw rugs in walkways, pet beds, electrical cords, etc? Yes  Adequate lighting in your home to reduce risk of falls? Yes  ASSISTIVE DEVICES UTILIZED TO PREVENT FALLS:  Life alert? No   Use of a cane, walker or w/c? No  Grab bars in the bathroom? Yes  Shower chair or bench in shower? Yes  Elevated toilet seat or a handicapped toilet? Yes   TIMED UP AND GO:  Was the test performed? No .  Length of time to ambulate 10 feet: N/A sec.   Patient stated that she has no issues with gait or balance; does not use any assistive devices.  Cognitive Function:  Patient is cogitatively intact.      02/21/2022    4:32 PM  6CIT Screen  What Year? 0 points  What month? 0 points  What time? 0 points  Count back from 20 0 points  Months in reverse 0 points  Repeat phrase 0 points  Total Score 0 points    Immunizations Immunization History  Administered Date(s) Administered   Fluad Quad(high Dose 65+) 02/02/2020, 02/01/2022   Influenza Whole 02/22/2007, 02/08/2013   Influenza, High Dose Seasonal PF 02/04/2015, 02/18/2017, 02/21/2018, 01/16/2019, 01/25/2021   Influenza-Unspecified 01/24/2014, 01/25/2016, 02/18/2017, 01/16/2019, 01/25/2020   Moderna Covid-19 Vaccine Bivalent Booster 89yr & up 02/11/2022   PFIZER(Purple Top)SARS-COV-2 Vaccination 06/18/2019, 07/09/2019, 01/09/2020, 07/03/2020, 02/07/2021   Pfizer Covid-19 Vaccine Bivalent Booster 178yr& up 02/08/2021   Pneumococcal Conjugate-13 11/23/2012, 09/08/2014   Pneumococcal Polysaccharide-23 05/26/2006   Pneumococcal-Unspecified 12/28/2012, 06/09/2020   Respiratory Syncytial Virus Vaccine,Recomb Aduvanted(Arexvy) 01/23/2022   Td 05/28/2010   Tdap 12/24/2020, 01/07/2021   Unspecified SARS-COV-2 Vaccination 06/26/2020, 02/11/2022   Varicella 11/07/2020   Zoster Recombinat (Shingrix) 02/10/2019, 04/14/2019   Zoster, Live 05/26/2009, 07/10/2020    TDAP status: Up to date  Flu Vaccine status: Up to date  Pneumococcal vaccine status: Due, Education has been provided regarding the importance of this vaccine. Advised may receive this vaccine at local pharmacy or Health Dept. Aware to provide a copy of the  vaccination record if obtained from local pharmacy or Health Dept. Verbalized acceptance and understanding.  Covid-19 vaccine status: Completed vaccines  Qualifies for Shingles Vaccine? Yes   Zostavax completed No   Shingrix Completed?: Yes  Screening Tests Health Maintenance  Topic Date Due   Pneumonia Vaccine 6565Years old (3 - PPSV23 or PCV20) 06/09/2021   Hepatitis C Screening  08/11/2058 (Originally 02/23/1959)   COVID-19 Vaccine (8 - Pfizer risk series) 04/08/2022   TETANUS/TDAP  01/08/2031   INFLUENZA VACCINE  Completed   DEXA SCAN  Completed   Zoster Vaccines- Shingrix  Completed   HPV VACCINES  Aged Out    Health Maintenance  Health Maintenance Due  Topic Date Due   Pneumonia Vaccine [age]Years old (3 53 PPSV23 or PCV20) 06/09/2021    Colorectal cancer screening: No longer required.   Mammogram status: No longer required due to age.  Bone Density status: Ordered 12/12/21. Pt provided with contact info and advised to call to schedule appt.  Lung Cancer Screening: (Low Dose CT Chest recommended if Age 80-80ears, 30 pack-year currently smoking OR have quit w/in 15years.) does not qualify.   Lung Cancer Screening Referral: N/A  Additional Screening:  Hepatitis C Screening: does qualify; Completed N/A  Vision Screening: Recommended annual ophthalmology exams for early detection of glaucoma and other disorders of the eye. Is the patient up to date with their annual eye exam?  Yes  Who is the provider or what is the name of the office in which the patient attends annual eye exams? Dr. LyTyrone Schimkef pt is not  established with a provider, would they like to be referred to a provider to establish care? No .   Dental Screening: Recommended annual dental exams for proper oral hygiene  Community Resource Referral / Chronic Care Management: CRR required this visit?  No   CCM required this visit?  No      Plan:     I have personally reviewed and noted the following in the  patient's chart:   Medical and social history Use of alcohol, tobacco or illicit drugs  Current medications and supplements including opioid prescriptions. Patient is currently taking opioid prescriptions. Information provided to patient regarding non-opioid alternatives. Patient advised to discuss non-opioid treatment plan with their provider. Functional ability and status Nutritional status Physical activity Advanced directives List of other physicians Hospitalizations, surgeries, and ER visits in previous 12 months Vitals Screenings to include cognitive, depression, and falls Referrals and appointments  In addition, I have reviewed and discussed with patient certain preventive protocols, quality metrics, and best practice recommendations. A written personalized care plan for preventive services as well as general preventive health recommendations were provided to patient.     Rossie Muskrat, [place]   02/21/2022   Nurse Notes: N/A

## 2022-02-21 NOTE — Patient Instructions (Signed)
It was great speaking with you today!  Please schedule your next Medicare Wellness Visit with your Nurse Health Advisor in 1 year by calling [phone]. 

## 2022-02-22 ENCOUNTER — Other Ambulatory Visit: Payer: Self-pay | Admitting: Internal Medicine

## 2022-02-24 ENCOUNTER — Ambulatory Visit: Payer: Medicare Other | Admitting: Physical Therapy

## 2022-02-24 ENCOUNTER — Encounter: Payer: Self-pay | Admitting: Physical Therapy

## 2022-02-24 DIAGNOSIS — M6281 Muscle weakness (generalized): Secondary | ICD-10-CM | POA: Diagnosis not present

## 2022-02-24 DIAGNOSIS — M8448XA Pathological fracture, other site, initial encounter for fracture: Secondary | ICD-10-CM | POA: Diagnosis not present

## 2022-02-24 DIAGNOSIS — R262 Difficulty in walking, not elsewhere classified: Secondary | ICD-10-CM

## 2022-02-24 NOTE — Therapy (Signed)
OUTPATIENT PHYSICAL THERAPY TREATMENT NOTE   Patient Name: Sherri [name] MRN: [id number] DOB:1940-10-06, 81 y.o., female Today's Date: 02/24/2022  PCP: Binnie Rail MD REFERRING PROVIDER:  Viona Gilmore, NP  END OF SESSION:   PT End of Session - 02/24/22 1431     Visit Number 6    Number of Visits 20    Date for PT Re-Evaluation 04/01/22    Progress Note Due on Visit 10    PT Start Time 1432    PT Stop Time 1511    PT Time Calculation (min) 39 min    Activity Tolerance Patient tolerated treatment well    Behavior During Therapy [facility] for tasks assessed/performed              Past Medical History:  Diagnosis Date   Anxiety    takes Valium daily as needed   Basal cell carcinoma 01/21/1988   left nostril (MOHS), sup-right calf (CX35FU)   Basal cell carcinoma 03/22/1996   right calf (CX35FU)   Basal cell carcinoma 03/31/1995   left wing nose   Bruises easily    d/t meds   Cancer ([place])    basal cell ca, in situ- uterine    Cataracts, bilateral    removed bilateral   Chronic back pain    Dizziness    r/t to meds   GERD (gastroesophageal reflux disease)    no meds on a regular basis but will take Tums if needed   Headache(784.0)    r/t neck issues   History of bronchitis 6-84yr ago   History of colon polyps    benign   HOH (hard of hearing)    wears hearing aids   Hyperlipidemia    takes Atorvastatin on Mondays and Fridays   Hypertension    Joint pain    Joint swelling    [place]    Neuromuscular disorder ([place]    Dr. RPark Liter [facility], follows [facility]   Nocturia    PONV (postoperative nausea and vomiting)    trouble urinating after surgery in 2014   Postoperative nausea and vomiting 01/11/2019   [place]umatoid [place]    Dr VJani Gravelweekly, RA- hands- knees- feet    Rheumatoid arthritis(714.0)    Dr DEstanislado Pandytakes XMorrie Sheldondaily   Right wrist fracture    Skin cancer    Squamous cell carcinoma of skin  11/02/2001   in situ-right knee (cx357f   Squamous cell carcinoma of skin 11/12/2011   in situ-left forearm (CX35FU), in situ-left foot (CX35FU)   Squamous cell carcinoma of skin 01/22/2012   in situ-left lower forearm (txpbx)   Squamous cell carcinoma of skin 11/17/2012   right shin (txpbx)   Squamous cell carcinoma of skin 10/28/2013   in situ-right shoulder (CX35FU)   Squamous cell carcinoma of skin 04/28/2014   well diff-right shin (txpbx)   Squamous cell carcinoma of skin 11/02/2014   in situ-Left shin (txpbx)   Squamous cell carcinoma of skin 12/15/2014   in situ-Left hand (txpbx), bowens-left side chest (txpbx)   Squamous cell carcinoma of skin 05/09/2015   in situ-left hand (txpbx)    Squamous cell carcinoma of skin 05/07/2016   in situ-left inner shin,ant, in situ-left inner shin, post, in situ-left outer forearm, in situ-right knuckle   Squamous cell carcinoma of skin 03/1302018   in situ-left shoulder (txpbx), in situ-right inner shin (txpbx), in situ-top of left foot (txpbx), KA- left forearm (txpbx)   Squamous cell carcinoma  of skin 12/02/2016   in situ-left inner shin (txpbx)   Squamous cell carcinoma of skin 03/04/2017   in situ-left outer sup, shin (txpbx), in situ- right 2nd knuckle finger (txpbx), in situ- Left wrist (txpbx)   Squamous cell carcinoma of skin 04/06/2018   in situ-above left knee inner (txpbx)   Squamous cell carcinoma of skin 04/21/2018   in situ-right top hand (txpbx)   Squamous cell carcinoma of skin 07/08/2018   in situ-right lower inner shin (txpbx)   Squamous cell carcinoma of skin 04/27/2019   in situ- Left neck(CX35FU), In situ- right neck (CX35FU)   Urinary retention    sees Dr.Wrenn about 2 times a yr   Past Surgical History:  Procedure Laterality Date   [place]     with TAH   BACK SURGERY     several   BREAST BIOPSY Right    Results were negative   BROW LIFT Bilateral 10/02/2016    Procedure: BILATERAL LOWER LID BLEPHAROPLASTY;  Surgeon: Clista Bernhardt, MD;  Location: [place] [facility];  Service: Plastics;  Laterality: Bilateral;   CARPAL TUNNEL RELEASE Right    cataracts     CERVICAL FUSION     Dr Joya Salm   CERVICAL FUSION  12/31/2012   Dr Joya Salm   CHEST TUBE INSERTION     for traumatic Pneumothorax   COLONOSCOPY  07/16/2010   normal    COLONOSCOPY W/ POLYPECTOMY  1997   negative since; Dr Deatra Ina   ESOPHAGOGASTRODUODENOSCOPY  07/16/2010   normal   eye lid raise     EYE SURGERY Bilateral    cataracts removed - /w IOL   JOINT REPLACEMENT     LUMBAR FUSION     Dr Joya Salm   NASAL SINUS SURGERY     POSTERIOR CERVICAL FUSION/FORAMINOTOMY N/A 12/31/2012   Procedure: POSTERIOR LATERAL CERVICAL FUSION/FORAMINOTOMY LEVEL 1 CERVICAL THREE-FOUR WITH LATERAL MASS SCREWS;  Surgeon: Floyce Stakes, MD;  Location: [place] [facility];  Service: Neurosurgery;  Laterality: N/A;   PTOSIS REPAIR Bilateral 10/02/2016   Procedure: INTERNAL PTOSIS REPAIR;  Surgeon: Clista Bernhardt, MD;  Location: [place];  Service: Plastics;  Laterality: Bilateral;   SKIN BIOPSY     THYROID SURGERY     R lobe removed- 1972, has grown back - CT last done- 2018   TOTAL HIP ARTHROPLASTY  12/22/2011   Procedure: TOTAL HIP ARTHROPLASTY;  Surgeon: Kerin Salen, MD;  Location: [place];  Service: Orthopedics;  Laterality: Left;   TOTAL SHOULDER ARTHROPLASTY     TUBAL LIGATION     UPPER GASTROINTESTINAL ENDOSCOPY  2012   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   Patient Active Problem List   Diagnosis Date Noted   Lumbar vertebral fracture, pathologic 09/20/2021   Degenerative spondylolisthesis 08/19/2021   Acute left lumbar radiculopathy 07/05/2021   Closed fracture of rib of right side with delayed healing 05/29/2021   Senile purpura ([place]) 12/19/2020   Restless leg 02/02/2020   Bilateral leg edema 01/11/2020   Cervical radiculopathy 06/28/2019   Carpal tunnel syndrome on both sides 06/28/2019   Right sided sciatica  04/14/2018   Trochanteric bursitis of right hip 04/14/2018   S/P cervical spinal fusion 11/10/2017   Dysphagia 10/26/2017   Gastroesophageal reflux disease 08/06/2017   Easy bruising 08/06/2017   Mid back pain 03/26/2017   Submandibular gland swelling 01/08/2017   High risk medication use 09/02/2016   Piriformis muscle pain 09/02/2016  Right hip pain 04/08/2016   Right knee pain 02/25/2016   Numbness 11/14/2014   Biceps tendon tear 10/10/2014   Insomnia 10/10/2014   Gait disturbance 06/13/2014   OP (osteoporosis) 01/18/2014   Other long term (current) drug therapy 01/18/2014   Hoarseness 12/29/2013   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 01/01/2013   Personal history of colonic polyps 07/03/2010   Nocturia 05/28/2010   THYROID NODULE 05/17/2008   Multiple sclerosis ([place]) 05/17/2008   Other fatigue 03/30/2007   Hyperlipidemia 08/20/2006   Essential hypertension 08/20/2006   Rheumatoid arthritis ([place]) 08/20/2006   CERVICAL CANCER, HX OF 08/20/2006   GASTRIC ULCER, HX OF 08/20/2006    REFERRING DIAG: 4th lumbar vertebrae fracture, neck pain  THERAPY DIAG:  Pathological fracture of lumbar vertebra, initial encounter  Muscle weakness (generalized)  Difficulty in walking, not elsewhere classified  Rationale for Evaluation and Treatment Rehabilitation  PERTINENT HISTORY: MS, RA, hx of skin cancer, L THA, posterior cervical fusion C3-4 2006, 2014, osteoporosis   PRECAUTIONS: released - ease into lifting and BTLS  SUBJECTIVE: States that she is still having pain in her upper back and it feels like there is a knife stabbing into her upper back. States the pain is all the time. States this has been going on off an on for a while. History of left ribs 4-7 after coughing from being sick.  Eval: States that she had back surgery for the first time with hardware placed in her back over 10 years ago. States her MD went in to fix original surgery earlier this year and was fine  until about a week after the surgery where she started having extreme radicular symptoms. Imaging demonstrated a compression fracture at L4. MD performed second surgery with larger fusion to help with fixation and has been in a lumbar brace since the surgery. As of last week she was allowed to come out of the brace to walk around the house and when she lays/sits down to rest.   Patient is primarily concerned with weakness in her legs. Her balance is off but it is off because of her MS. States she can currently stand for 5 minutes at a time  PAIN:  Are you having pain? Yes: NPRS scale: 6/10 Pain location: low back and all over and under left chest/breast Pain description: ache Aggravating factors: too active  Relieving factors: heat  OBJECTIVE:    DIAGNOSTIC FINDINGS:  CT lumbar spine 09/12/21 FINDINGS: Segmentation: 5 lumbar type vertebrae.   Alignment: Approximately 6 mm anterolisthesis at L3-L4.   Vertebrae: Osseous demineralization. Postoperative changes are present with rods and pedicle screws at L3-L4. There is subsidence of a left-sided interbody spacer into L3. Subsidence right sided interbody spacer into fractured L4 vertebral body. Rods and pedicle screws are also present at L5-S1. Interbody spacers are present at L4-L5 and L5-S1. Laminectomies are present at the operative levels. Streak artifact associated with the hardware.     IMPRESSION: Known L4 fracture. Postoperative changes as described with intact hardware. There is subsidence of the L3-L4 interbody spacers into L3 and L4. Canal is not well evaluated at the fracture and operative levels due to artifact. There is probable bilateral foraminal narrowing at L4-L5 secondary to distortion from the fracture.       SCREENING FOR RED FLAGS: Bowel or bladder incontinence: No Spinal tumors: No Cauda equina syndrome: No Compression fracture: Yes: from 4/23 Abdominal aneurysm: No   COGNITION:           Overall  cognitive status: Within functional limits for tasks assessed                          SENSATION: WFL   MUSCLE LENGTH: Hamstrings: Right 0 deg; Left 20 deg     POSTURE: rounded shoulders, forward head, posterior pelvic tilt, and flexed trunk      LUMBAR ROM: - not assessed             LE Measurements       Lower Extremity Right 01/21/2022 Left 01/21/2022    A/PROM MMT A/PROM MMT  Hip Flexion 120 4 120 4-  Hip Extension          Hip Abduction          Hip Adduction          Hip Internal rotation 45   55    Hip External rotation 45   45    Knee Flexion   4-   3+  Knee Extension   4   4-  Ankle Dorsiflexion   4   4-  Ankle Plantarflexion          Ankle Inversion          Ankle Eversion           (Blank rows = not tested)            * pain     LUMBAR SPECIAL TESTS:  Slump test - neg   FUNCTIONAL TESTS:  STS - can get out of chair without UE support but favors R LE Standing - leans to the right                       Unable to stand on either leg in SLS or in tandem (patient reports this is 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) GAIT: Distance walked: 50 feet Assistive device utilized: None Level of assistance: Complete Independence Comments: with lumbar brace on- stiff, limited hip extension, leans to the right     TODAY'S TREATMENT  02/24/2022  Therapeutic Exercise:    Aerobic: Supine: shoulder protraction B x20 5" holds, breathing into upper front ribs with tactile cues -6x5    Seated:    Standing:  Neuromuscular Re-education:   Manual Therapy: STM to left rhomboids and thoracic paraspinals and intercostal muscles  tolerated well - 20 minutes Therapeutic Activity: Self Care: Trigger Point Dry Needling:  Modalities: Moist heat below back for entire session.      PATIENT EDUCATION:  Education details: on  rationale for breathing interventions, on HEP, on following up with MD about chest xray Person educated: Patient Education method: Explanation, Demonstration, and  Handouts Education comprehension: verbalized understanding    HOME EXERCISE PROGRAM: [id number]   ASSESSMENT:   CLINICAL IMPRESSION: Session focused on gentle STM and breathing exercises. Instructed patient to follow up with MD about possible need for chest xray  given history of not-traumatic rib fractures and ongoing/continued thoracic pain. Improved symptoms in thoracic muscles after gentle STM. Will continue POC as tolerated    OBJECTIVE IMPAIRMENTS Abnormal gait, decreased activity tolerance, decreased balance, decreased knowledge of use of DME, decreased mobility, difficulty walking, decreased ROM, decreased strength, improper body mechanics, postural dysfunction, and pain.    ACTIVITY LIMITATIONS carrying, lifting, bending, sitting, standing, squatting, stairs, bed mobility, and locomotion level   PARTICIPATION LIMITATIONS: meal prep, cleaning, and community activity   PERSONAL FACTORS Age, Fitness, and 3+ comorbidities: MS, x4 lx surgeries,  osteoporosis  are also affecting patient's functional outcome.    REHAB POTENTIAL: Fair given patients history   CLINICAL DECISION MAKING: Evolving/moderate complexity   EVALUATION COMPLEXITY: Moderate     GOALS: Goals reviewed with patient?  yes   SHORT TERM GOALS:   Patient will be independent in self management strategies to improve quality of life and functional outcomes. Baseline: new program Target date: 02/25/2022 Goal status: INITIAL   2.  Patient will report at least 50% improvement in overall symptoms and/or function to demonstrate improved functional mobility Baseline: 0% Target date: 02/25/2022 Goal status: INITIAL   3.  Patient will be able to stand for at least 15 minutes with brace to demonstrate improved standing endurance Baseline: 5 minutes Target date: 02/25/2022 Goal status: INITIAL           LONG TERM GOALS:   Patient will report at least 75% improvement in overall symptoms and/or function to demonstrate  improved functional mobility Baseline: 0% Target date: 04/01/2022 Goal status: INITIAL   2.  Patient will be able to demonstrate at least 4/5 MMT in bilateral legs to demonstrate improved strength. Baseline: see above Target date: 04/01/2022 Goal status: INITIAL   3.  Patient will be able to stand for at least 25 minutes at a time to improve ability to cook meals Baseline: 5 minutes Target date: 04/01/2022 Goal status: INITIAL     PLAN: PT FREQUENCY: 2x/week   PT DURATION: 10 weeks   PLANNED INTERVENTIONS: Therapeutic exercises, Therapeutic activity, Neuromuscular re-education, Balance training, Gait training, Patient/Family education, Self Care, Joint mobilization, Joint manipulation, Orthotic/Fit training, Aquatic Therapy, Dry Needling, Cognitive remediation, Electrical stimulation, Cryotherapy, Moist heat, Ultrasound, Ionotophoresis '4mg'$ /ml Dexamethasone, and Manual therapy.   PLAN FOR NEXT SESSION: isometrics, core activation, standing exercises/endurance, LE strengthening.    4:01 PM, 02/24/22 [name], DPT Physical Therapy with [facility]

## 2022-02-25 ENCOUNTER — Other Ambulatory Visit: Payer: Self-pay | Admitting: Internal Medicine

## 2022-02-26 ENCOUNTER — Encounter: Payer: Self-pay | Admitting: Physical Therapy

## 2022-02-26 ENCOUNTER — Ambulatory Visit: Payer: Medicare Other | Admitting: Physical Therapy

## 2022-02-26 DIAGNOSIS — M542 Cervicalgia: Secondary | ICD-10-CM

## 2022-02-26 DIAGNOSIS — R262 Difficulty in walking, not elsewhere classified: Secondary | ICD-10-CM

## 2022-02-26 DIAGNOSIS — M8448XA Pathological fracture, other site, initial encounter for fracture: Secondary | ICD-10-CM

## 2022-02-26 DIAGNOSIS — M6281 Muscle weakness (generalized): Secondary | ICD-10-CM

## 2022-02-26 NOTE — Therapy (Addendum)
OUTPATIENT PHYSICAL THERAPY TREATMENT NOTE  PHYSICAL THERAPY DISCHARGE SUMMARY  Visits from Start of Care: 7  Current functional level related to goals / functional outcomes: Unable to assess due to unplanned discharge    Remaining deficits: Unable to assess due to unplanned discharge    Education / Equipment: Unable to assess due to unplanned discharge    Patient agrees to discharge. Patient goals were not met. Patient is being discharged due to not returning since the last visit.  3:38 PM, 06/19/22 Sherri [name], DPT Physical Therapy with [place]     Patient Name: Sherri [name] MRN: [id number] DOB:[birth year], 81 y.o., female 39 Date: 02/26/2022  PCP: Sherri Rail MD REFERRING PROVIDER:  Viona Gilmore, NP  END OF SESSION:   PT End of Session - 02/26/22 1431     Visit Number 7    Number of Visits 20    Date for PT Re-Evaluation 04/01/22    Progress Note Due on Visit 10    PT Start Time 1432    PT Stop Time 1513    PT Time Calculation (min) 41 min    Activity Tolerance Patient tolerated treatment well    Behavior During Therapy [facility] for tasks assessed/performed              Past Medical History:  Diagnosis Date   Anxiety    takes Valium daily as needed   Basal cell carcinoma 01/21/1988   left nostril (MOHS), sup-right calf (CX35FU)   Basal cell carcinoma 03/22/1996   right calf (CX35FU)   Basal cell carcinoma 03/31/1995   left wing nose   Bruises easily    d/t meds   Cancer ([place])    basal cell ca, in situ- uterine    Cataracts, bilateral    removed bilateral   Chronic back pain    Dizziness    r/t to meds   GERD (gastroesophageal reflux disease)    no meds on a regular basis but will take Tums if needed   Headache(784.0)    r/t neck issues   History of bronchitis 6-24yr ago   History of colon polyps    benign   HOH (hard of hearing)    wears hearing aids   Hyperlipidemia    takes Atorvastatin on Mondays and Fridays    Hypertension    Joint pain    Joint swelling    Multiple sclerosis ([place]    Neuromuscular disorder ([place]    Dr. RPark Liter [facility], follows M.S.   Nocturia    PONV (postoperative nausea and vomiting)    trouble urinating after surgery in 2014   Postoperative nausea and vomiting 01/11/2019   Rheumatoid arthritis ([place]    Dr VJani Gravelweekly, RA- hands- knees- feet    Rheumatoid arthritis(714.0)    Dr DEstanislado Pandytakes XMorrie Sheldondaily   Right wrist fracture    Skin cancer    Squamous cell carcinoma of skin 11/02/2001   in situ-right knee (cx315f   Squamous cell carcinoma of skin 11/12/2011   in situ-left forearm (CX35FU), in situ-left foot (CX35FU)   Squamous cell carcinoma of skin 01/22/2012   in situ-left lower forearm (txpbx)   Squamous cell carcinoma of skin 11/17/2012   right shin (txpbx)   Squamous cell carcinoma of skin 10/28/2013   in situ-right shoulder (CX35FU)   Squamous cell carcinoma of skin 04/28/2014   well diff-right shin (txpbx)   Squamous cell carcinoma of skin 11/02/2014   in situ-Left shin (txpbx)  Squamous cell carcinoma of skin 12/15/2014   in situ-Left hand (txpbx), bowens-left side chest (txpbx)   Squamous cell carcinoma of skin 05/09/2015   in situ-left hand (txpbx)    Squamous cell carcinoma of skin 05/07/2016   in situ-left inner shin,ant, in situ-left inner shin, post, in situ-left outer forearm, in situ-right knuckle   Squamous cell carcinoma of skin 03/1302018   in situ-left shoulder (txpbx), in situ-right inner shin (txpbx), in situ-top of left foot (txpbx), KA- left forearm (txpbx)   Squamous cell carcinoma of skin 12/02/2016   in situ-left inner shin (txpbx)   Squamous cell carcinoma of skin 03/04/2017   in situ-left outer sup, shin (txpbx), in situ- right 2nd knuckle finger (txpbx), in situ- Left wrist (txpbx)   Squamous cell carcinoma of skin 04/06/2018   in situ-above left knee inner (txpbx)   Squamous cell carcinoma of  skin 04/21/2018   in situ-right top hand (txpbx)   Squamous cell carcinoma of skin 07/08/2018   in situ-right lower inner shin (txpbx)   Squamous cell carcinoma of skin 04/27/2019   in situ- Left neck(CX35FU), In situ- right neck (CX35FU)   Urinary retention    sees Dr.Wrenn about 2 times a yr   Past Surgical History:  Procedure Laterality Date   [place]     with TAH   BACK SURGERY     several   BREAST BIOPSY Right    Results were negative   BROW LIFT Bilateral 10/02/2016   Procedure: BILATERAL LOWER LID BLEPHAROPLASTY;  Surgeon: Clista Bernhardt, MD;  Location: [place];  Service: Plastics;  Laterality: Bilateral;   CARPAL TUNNEL RELEASE Right    cataracts     CERVICAL FUSION     Dr Joya Salm   CERVICAL FUSION  12/31/2012   Dr Joya Salm   CHEST TUBE INSERTION     for traumatic Pneumothorax   COLONOSCOPY  07/16/2010   normal    COLONOSCOPY W/ POLYPECTOMY  1997   negative since; Dr Deatra Ina   ESOPHAGOGASTRODUODENOSCOPY  07/16/2010   normal   eye lid raise     EYE SURGERY Bilateral    cataracts removed - /w IOL   JOINT REPLACEMENT     LUMBAR FUSION     Dr Joya Salm   NASAL SINUS SURGERY     POSTERIOR CERVICAL FUSION/FORAMINOTOMY N/A 12/31/2012   Procedure: POSTERIOR LATERAL CERVICAL FUSION/FORAMINOTOMY LEVEL 1 CERVICAL THREE-FOUR WITH LATERAL MASS SCREWS;  Surgeon: Floyce Stakes, MD;  Location: [place] [facility];  Service: Neurosurgery;  Laterality: N/A;   PTOSIS REPAIR Bilateral 10/02/2016   Procedure: INTERNAL PTOSIS REPAIR;  Surgeon: Clista Bernhardt, MD;  Location: [place];  Service: Plastics;  Laterality: Bilateral;   SKIN BIOPSY     THYROID SURGERY     R lobe removed- 1972, has grown back - CT last done- 2018   TOTAL HIP ARTHROPLASTY  12/22/2011   Procedure: TOTAL HIP ARTHROPLASTY;  Surgeon: Kerin Salen, MD;  Location: [place];  Service: Orthopedics;  Laterality: Left;   TOTAL SHOULDER ARTHROPLASTY     TUBAL LIGATION     UPPER  GASTROINTESTINAL ENDOSCOPY  2012   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   Patient Active Problem List   Diagnosis Date Noted   Lumbar vertebral fracture, pathologic 09/20/2021   Degenerative spondylolisthesis 08/19/2021   Acute left lumbar radiculopathy 07/05/2021   Closed fracture of rib of right side with delayed healing 05/29/2021  Senile purpura ([place]) 12/19/2020   Restless leg 02/02/2020   Bilateral leg edema 01/11/2020   Cervical radiculopathy 06/28/2019   Carpal tunnel syndrome on both sides 06/28/2019   Right sided sciatica 04/14/2018   Trochanteric bursitis of right hip 04/14/2018   S/P cervical spinal fusion 11/10/2017   Dysphagia 10/26/2017   Gastroesophageal reflux disease 08/06/2017   Easy bruising 08/06/2017   Mid back pain 03/26/2017   Submandibular gland swelling 01/08/2017   High risk medication use 09/02/2016   Piriformis muscle pain 09/02/2016   Right hip pain 04/08/2016   Right knee pain 02/25/2016   Numbness 11/14/2014   Biceps tendon tear 10/10/2014   Insomnia 10/10/2014   Gait disturbance 06/13/2014   OP (osteoporosis) 01/18/2014   Other long term (current) drug therapy 01/18/2014   Hoarseness 12/29/2013   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 01/01/2013   Personal history of colonic polyps 07/03/2010   Nocturia 05/28/2010   THYROID NODULE 05/17/2008   Multiple sclerosis ([place]) 05/17/2008   Other fatigue 03/30/2007   Hyperlipidemia 08/20/2006   Essential hypertension 08/20/2006   Rheumatoid arthritis ([place]) 08/20/2006   CERVICAL CANCER, HX OF 08/20/2006   GASTRIC ULCER, HX OF 08/20/2006    REFERRING DIAG: 4th lumbar vertebrae fracture, neck pain  THERAPY DIAG:  Pathological fracture of lumbar vertebra, initial encounter  Muscle weakness (generalized)  Difficulty in walking, not elsewhere classified  Cervicalgia  Rationale for Evaluation and Treatment Rehabilitation  PERTINENT HISTORY: MS, RA, hx of skin cancer, L THA, posterior  cervical fusion C3-4 2006, 2014, osteoporosis   PRECAUTIONS: released - ease into lifting and BTLS  SUBJECTIVE: States that her pain is the same and really bad and she had to go back on her tramadol. States massage is the only thing that makes it better is massage. States her hands hurt so bad and they just tingle. States she gets some relief from the voltaren gel.   Eval: States that she had back surgery for the first time with hardware placed in her back over 10 years ago. States her MD went in to fix original surgery earlier this year and was fine until about a week after the surgery where she started having extreme radicular symptoms. Imaging demonstrated a compression fracture at L4. MD performed second surgery with larger fusion to help with fixation and has been in a lumbar brace since the surgery. As of last week she was allowed to come out of the brace to walk around the house and when she lays/sits down to rest.   Patient is primarily concerned with weakness in her legs. Her balance is off but it is off because of her MS. States she can currently stand for 5 minutes at a time  PAIN:  Are you having pain? Yes: NPRS scale: 8/10 Pain location: low back and all over and under left chest/breast Pain description: ache Aggravating factors: too active  Relieving factors: heat  OBJECTIVE:    DIAGNOSTIC FINDINGS:  CT lumbar spine 09/12/21 FINDINGS: Segmentation: 5 lumbar type vertebrae.   Alignment: Approximately 6 mm anterolisthesis at L3-L4.   Vertebrae: Osseous demineralization. Postoperative changes are present with rods and pedicle screws at L3-L4. There is subsidence of a left-sided interbody spacer into L3. Subsidence right sided interbody spacer into fractured L4 vertebral body. Rods and pedicle screws are also present at L5-S1. Interbody spacers are present at L4-L5 and L5-S1. Laminectomies are present at the operative levels. Streak artifact associated with the hardware.      IMPRESSION: Known  L4 fracture. Postoperative changes as described with intact hardware. There is subsidence of the L3-L4 interbody spacers into L3 and L4. Canal is not well evaluated at the fracture and operative levels due to artifact. There is probable bilateral foraminal narrowing at L4-L5 secondary to distortion from the fracture.       SCREENING FOR RED FLAGS: Bowel or bladder incontinence: No Spinal tumors: No Cauda equina syndrome: No Compression fracture: Yes: from 4/23 Abdominal aneurysm: No   COGNITION:           Overall cognitive status: Within functional limits for tasks assessed                          SENSATION: WFL   MUSCLE LENGTH: Hamstrings: Right 0 deg; Left 20 deg     POSTURE: rounded shoulders, forward head, posterior pelvic tilt, and flexed trunk      LUMBAR ROM: - not assessed             LE Measurements       Lower Extremity Right 01/21/2022 Left 01/21/2022    A/PROM MMT A/PROM MMT  Hip Flexion 120 4 120 4-  Hip Extension          Hip Abduction          Hip Adduction          Hip Internal rotation 45   55    Hip External rotation 45   45    Knee Flexion   4-   3+  Knee Extension   4   4-  Ankle Dorsiflexion   4   4-  Ankle Plantarflexion          Ankle Inversion          Ankle Eversion           (Blank rows = not tested)            * pain     LUMBAR SPECIAL TESTS:  Slump test - neg   FUNCTIONAL TESTS:  STS - can get out of chair without UE support but favors R LE Standing - leans to the right                       Unable to stand on either leg in SLS or in tandem (patient reports this is 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) GAIT: Distance walked: 50 feet Assistive device utilized: None Level of assistance: Complete Independence Comments: with lumbar brace on- stiff, limited hip extension, leans to the right     TODAY'S TREATMENT  02/26/2022  Therapeutic Exercise:    Aerobic: Supine:shoulder flexion with posterior pelvic tilt 2  minutes, scap protraction with dowel wide grip 3x12 B, pillow hug focus on scapular protraction - x3 2 minutes, LTR with hip add iso 2 minutes    Seated:    Standing:  Neuromuscular Re-education:  TRA activation with marching, bent knee fall outs, breath work for [employer] activation 9 minutes Manual Therapy:  Therapeutic Activity: Self Care: Trigger Point Dry Needling:  Modalities: Moist heat below back for entire session.      PATIENT EDUCATION:  Education details: HEP Person educated: Patient Education method: Consulting civil engineer, Media planner, and Handouts Education comprehension: verbalized understanding    HOME EXERCISE PROGRAM: [id number]   ASSESSMENT:   CLINICAL IMPRESSION:  Session focused on lumbar and thoracic motions. This was tolerated well with her upper back feeling much better. Added new exercises to HEP  and will f/u with patient next week about decision to continue care as patient wanted to follow up with MD first. Patient would continue to benefit from skilled PT at this time.   OBJECTIVE IMPAIRMENTS Abnormal gait, decreased activity tolerance, decreased balance, decreased knowledge of use of DME, decreased mobility, difficulty walking, decreased ROM, decreased strength, improper body mechanics, postural dysfunction, and pain.    ACTIVITY LIMITATIONS carrying, lifting, bending, sitting, standing, squatting, stairs, bed mobility, and locomotion level   PARTICIPATION LIMITATIONS: meal prep, cleaning, and community activity   PERSONAL FACTORS Age, Fitness, and 3+ comorbidities: MS, x4 lx surgeries, osteoporosis  are also affecting patient's functional outcome.    REHAB POTENTIAL: Fair given patients history   CLINICAL DECISION MAKING: Evolving/moderate complexity   EVALUATION COMPLEXITY: Moderate     GOALS: Goals reviewed with patient?  yes   SHORT TERM GOALS:   Patient will be independent in self management strategies to improve quality of life and functional  outcomes. Baseline: new program Target date: 02/25/2022 Goal status: INITIAL   2.  Patient will report at least 50% improvement in overall symptoms and/or function to demonstrate improved functional mobility Baseline: 0% Target date: 02/25/2022 Goal status: INITIAL   3.  Patient will be able to stand for at least 15 minutes with brace to demonstrate improved standing endurance Baseline: 5 minutes Target date: 02/25/2022 Goal status: INITIAL           LONG TERM GOALS:   Patient will report at least 75% improvement in overall symptoms and/or function to demonstrate improved functional mobility Baseline: 0% Target date: 04/01/2022 Goal status: INITIAL   2.  Patient will be able to demonstrate at least 4/5 MMT in bilateral legs to demonstrate improved strength. Baseline: see above Target date: 04/01/2022 Goal status: INITIAL   3.  Patient will be able to stand for at least 25 minutes at a time to improve ability to cook meals Baseline: 5 minutes Target date: 04/01/2022 Goal status: INITIAL     PLAN: PT FREQUENCY: 2x/week   PT DURATION: 10 weeks   PLANNED INTERVENTIONS: Therapeutic exercises, Therapeutic activity, Neuromuscular re-education, Balance training, Gait training, Patient/Family education, Self Care, Joint mobilization, Joint manipulation, Orthotic/Fit training, Aquatic Therapy, Dry Needling, Cognitive remediation, Electrical stimulation, Cryotherapy, Moist heat, Ultrasound, Ionotophoresis '4mg'$ /ml Dexamethasone, and Manual therapy.   PLAN FOR NEXT SESSION: isometrics, core activation, standing exercises/endurance, LE strengthening.    3:17 PM, 02/26/22 Sherri [name], DPT Physical Therapy with [facility]

## 2022-03-03 ENCOUNTER — Other Ambulatory Visit: Payer: Self-pay | Admitting: Physician Assistant

## 2022-03-03 ENCOUNTER — Other Ambulatory Visit: Payer: Self-pay | Admitting: Rheumatology

## 2022-03-03 DIAGNOSIS — Z79899 Other long term (current) drug therapy: Secondary | ICD-10-CM

## 2022-03-03 NOTE — Patient Instructions (Addendum)
     Medications changes include :   none    Your prescription(s) have been sent to your pharmacy.    A referral was ordered for Dr Tamala Julian - Sports medicine.     Someone from that office will call you to schedule an appointment.    Return in about 6 months (around 09/03/2022) for Physical Exam.

## 2022-03-03 NOTE — Progress Notes (Unsigned)
Subjective:    Patient ID: Sherri [name], female    DOB: 1940-10-29, 81 y.o.   MRN: [id number]     HPI Sherri [name] is here for follow up of her chronic medical problems, including htn, hld, b/l leg edema, chronic pain from RA, MS, OA on tramadol  Pain in side - pain started in area of left shoulder blade and wraps around to breast.  Was in  brace from feb until September.  She lost all her core muscles.  The pain started when she took off the brace.  Pain is worse is constant and is an achy pain.  Moving her left arm - she hears a grinding in her shoulder pain.  Heating pad helps.  Muscle relaxer ? Helps.    She is covered in bruises  Constipated - she is on miralax daily.  She is taking a probiotic daily.  Having a BM   Left hand is numb - it started some time in the past few months.   Tramadol is the only thing that gives her relief.    She drinks a lot of fluids - not much water.  She is exercising minimally due to her back pain.    Medications and allergies reviewed with patient and updated if appropriate.  Current Outpatient Medications on File Prior to Visit  Medication Sig Dispense Refill   atorvastatin (LIPITOR) 10 MG tablet TAKE 1 TAB ON MONDAYS AND FRIDAY 27 tablet 3   Biotin 5000 MCG TABS Take 10,000 mcg by mouth every evening.     Calcium-Magnesium-Zinc (CAL-MAG-ZINC PO) Take 1 tablet by mouth in the morning.     Carboxymethylcellul-Glycerin (LUBRICATING EYE DROPS OP) Place 1 drop into both eyes daily as needed (dry eyes).     carvedilol (COREG) 6.25 MG tablet TAKE 1 TABLET BY MOUTH 2 TIMES DAILY WITH A MEAL. 180 tablet 1   cholecalciferol (VITAMIN D) 25 MCG (1000 UNIT) tablet Take 1,000 Units by mouth in the morning.     cyclobenzaprine (FLEXERIL) 10 MG tablet Take 1 tablet (10 mg total) by mouth 3 (three) times daily as needed for muscle spasms. 30 tablet 0   denosumab (PROLIA) 60 MG/ML SOSY injection Inject 60 mg into the skin every 6 (six) months. Deliver  to rheum: [address] [place], [place], [place] [address]. Appt on 01/09/22 1 mL 0   diazepam (VALIUM) 5 MG tablet TAKE 1 TABLET BY MOUTH AT BEDTIME AS NEEDED FOR MUSCLE SPASMS. 30 tablet 2   diclofenac sodium (VOLTAREN) 1 % GEL APPLY 3 GRAMS TO 3 LARGE JOINTS 3 TIMES A DAY AS NEEDED (Patient taking differently: Apply 2-3 g topically 3 (three) times daily as needed (joint pain).) 100 g 0   famotidine (PEPCID) 10 MG tablet Take 10 mg by mouth in the morning.     folic acid (FOLVITE) 1 MG tablet Take 1 tablet (1 mg total) by mouth daily. 90 tablet 3   furosemide (LASIX) 20 MG tablet TAKE 1 TABLET BY MOUTH EVERY DAY 90 tablet 1   methotrexate (RHEUMATREX) 2.5 MG tablet TAKE 6 TABLETS (15 MG TOTAL) BY MOUTH ONCE A WEEK. CAUTION:CHEMOTHERAPY. PROTECT FROM LIGHT. 72 tablet 0   polyethylene glycol (MIRALAX / GLYCOLAX) 17 g packet Take 17 g by mouth every other day. In the morning.     predniSONE (DELTASONE) 5 MG tablet 1 tablet by mouth every morning 90 tablet 1   traMADol (ULTRAM) 50 MG tablet TAKE 1 TABLET (50 MG TOTAL)  BY MOUTH 3 (THREE) TIMES DAILY AS NEEDED FOR SEVERE PAIN. 90 tablet 0   TUBERCULIN SYR 1CC/27GX1/2" 27G X 1/2" 1 ML MISC Use 1 syringe once weekly to inject methotrexate. 12 each 3   Upadacitinib ER 15 MG TB24 TAKE 1 TABLET (15 MG) BY MOUTH DAILY. (Patient taking differently: Take 15 mg by mouth daily.) 30 tablet 0   No current facility-administered medications on file prior to visit.     Review of Systems  Constitutional:  Negative for fever.  Respiratory:  Negative for shortness of breath.   Cardiovascular:  Negative for chest pain and palpitations.  Gastrointestinal:  Positive for constipation. Negative for abdominal pain.  Musculoskeletal:  Positive for back pain.  Neurological:  Positive for numbness. Negative for weakness.  Hematological:  Bruises/bleeds easily.       Objective:   Vitals:   03/04/22 1319  BP: 128/80  Pulse: 60  Temp: 97.9 F (36.6 C)  SpO2: 97%    BP Readings from Last 3 Encounters:  03/04/22 128/80  02/21/22 (!) 120/46  01/09/22 124/74   Wt Readings from Last 3 Encounters:  03/04/22 112 lb (50.8 kg)  02/21/22 106 lb (48.1 kg)  12/12/21 105 lb (47.6 kg)   Body mass index is 21.16 kg/m.    Physical Exam Constitutional:      General: She is not in acute distress.    Appearance: Normal appearance.  HENT:     Head: Normocephalic and atraumatic.  Eyes:     Conjunctiva/sclera: Conjunctivae normal.  Cardiovascular:     Rate and Rhythm: Normal rate and regular rhythm.     Heart sounds: Normal heart sounds. No murmur heard. Pulmonary:     Effort: Pulmonary effort is normal. No respiratory distress.     Breath sounds: Normal breath sounds. No wheezing.  Musculoskeletal:     Cervical back: Neck supple.     Right lower leg: No edema.     Left lower leg: No edema.     Comments: No T spine tenderness, some mild tenderness in left scapular region  Lymphadenopathy:     Cervical: No cervical adenopathy.  Skin:    General: Skin is warm and dry.     Findings: No rash.  Neurological:     Mental Status: She is alert. Mental status is at baseline.  Psychiatric:        Mood and Affect: Mood normal.        Behavior: Behavior normal.        Lab Results  Component Value Date   WBC 4.5 12/12/2021   HGB 12.7 12/12/2021   HCT 37.9 12/12/2021   PLT 353 12/12/2021   GLUCOSE 93 12/12/2021   CHOL 214 (H) 06/28/2021   TRIG 110 06/28/2021   HDL 88 06/28/2021   LDLDIRECT 122.9 11/15/2012   LDLCALC 105 (H) 06/28/2021   ALT 17 12/12/2021   AST 30 12/12/2021   NA 137 12/12/2021   K 4.9 12/12/2021   CL 100 12/12/2021   CREATININE 0.83 12/12/2021   BUN 12 12/12/2021   CO2 28 12/12/2021   TSH 1.37 04/10/2020   INR 0.95 12/16/2011   HGBA1C 5.6 10/24/2013     Assessment & Plan:    See Problem List for Assessment and Plan of chronic medical problems.

## 2022-03-04 ENCOUNTER — Encounter: Payer: Self-pay | Admitting: Internal Medicine

## 2022-03-04 ENCOUNTER — Other Ambulatory Visit: Payer: Self-pay

## 2022-03-04 ENCOUNTER — Telehealth: Payer: Self-pay

## 2022-03-04 ENCOUNTER — Ambulatory Visit (INDEPENDENT_AMBULATORY_CARE_PROVIDER_SITE_OTHER): Payer: Medicare Other | Admitting: Internal Medicine

## 2022-03-04 VITALS — BP 128/80 | HR 60 | Temp 97.9°F | Ht 61.0 in | Wt 112.0 lb

## 2022-03-04 DIAGNOSIS — K5909 Other constipation: Secondary | ICD-10-CM

## 2022-03-04 DIAGNOSIS — G35 Multiple sclerosis: Secondary | ICD-10-CM

## 2022-03-04 DIAGNOSIS — Z79899 Other long term (current) drug therapy: Secondary | ICD-10-CM

## 2022-03-04 DIAGNOSIS — R200 Anesthesia of skin: Secondary | ICD-10-CM

## 2022-03-04 DIAGNOSIS — M549 Dorsalgia, unspecified: Secondary | ICD-10-CM | POA: Diagnosis not present

## 2022-03-04 DIAGNOSIS — K5900 Constipation, unspecified: Secondary | ICD-10-CM | POA: Insufficient documentation

## 2022-03-04 DIAGNOSIS — I10 Essential (primary) hypertension: Secondary | ICD-10-CM | POA: Diagnosis not present

## 2022-03-04 DIAGNOSIS — R233 Spontaneous ecchymoses: Secondary | ICD-10-CM

## 2022-03-04 DIAGNOSIS — E7849 Other hyperlipidemia: Secondary | ICD-10-CM

## 2022-03-04 DIAGNOSIS — R202 Paresthesia of skin: Secondary | ICD-10-CM | POA: Diagnosis not present

## 2022-03-04 DIAGNOSIS — R600 Localized edema: Secondary | ICD-10-CM | POA: Diagnosis not present

## 2022-03-04 NOTE — Assessment & Plan Note (Signed)
Chronic Following with Dr Felecia Shelling Continue valium 5 mg HS prn for muscle spasms Continue tramadol 50 mg Q 6 hrs prn - typically takes 100 mg / day

## 2022-03-04 NOTE — Telephone Encounter (Signed)
Medication Samples have been provided to the patient.  Drug name: rinvoq       Strength: '15mg'$         Qty: 2  LOT: [id number]  Exp.Date: 06/14/2023   Dosing instructions: Take 1 tablet by mouth daily.

## 2022-03-04 NOTE — Telephone Encounter (Signed)
Next Visit: 05/14/2022   Last Visit: 12/12/2021  DX: Rheumatoid arthritis involving multiple sites with positive rheumatoid factor   Current Dose per office note 02/09/9149: folic acid to 1 mg p.o. daily  Okay to refill Folic Acid?

## 2022-03-04 NOTE — Assessment & Plan Note (Signed)
Chronic Controlled, stable Continue lasix 20 mg daily  

## 2022-03-04 NOTE — Assessment & Plan Note (Signed)
Chronic Tramadol and other meds may be contributing Taking miralax When stool softener added she had watery stools Discussed other things she can try Adjust miralax and stool softener as needed If not controlled can try trulance but will likely have a high copay

## 2022-03-04 NOTE — Assessment & Plan Note (Signed)
New Started after taking off the brace she wore after her back surgery - wore it from Feb to Sept likely MSK, ? Radiculopathy as a result of wearing the brace for that long Will refer to sports med - Dr Tamala Julian for evaluation

## 2022-03-04 NOTE — Assessment & Plan Note (Signed)
Acute In ulnar distribution Will discuss with Dr Felecia Shelling - he may be able to arrange an EMG - if not I can refer to ortho

## 2022-03-04 NOTE — Assessment & Plan Note (Signed)
Chronic BP well controlled  Continue coreg 6.25 mg bid

## 2022-03-04 NOTE — Assessment & Plan Note (Signed)
Easy bruising This is chronic reassured

## 2022-03-04 NOTE — Assessment & Plan Note (Signed)
Chronic Check lipid panel  Continue atorvastatin 10 mg twice a week Regular exercise and healthy diet encouraged

## 2022-03-04 NOTE — Telephone Encounter (Signed)
Next Visit: 05/14/2022  Last Visit: 12/12/2021  Last Fill: 12/12/2021  DX: Rheumatoid arthritis involving multiple sites with positive rheumatoid factor  Current Dose per office note 12/12/2021: methotrexate 2.5 mg tablet, 6 tablets p.o. weekly  Labs: 12/12/2021 CBC and CMP are normal.  Patient advised she is due to update labs. Patient states she also needs samples of [name]. Patient states she will update labs this afternoon. Patient will pick up samples at that time.   Okay to refill MTX?

## 2022-03-05 DIAGNOSIS — Z79899 Other long term (current) drug therapy: Secondary | ICD-10-CM | POA: Diagnosis not present

## 2022-03-06 NOTE — Progress Notes (Signed)
CMP stable.  Patient is anemic-Rbc count, hgb, and hct are low.   MCV and MCH are borderline elevated.  Please see if folate and B12 can be added.   Rest of CBC WNL.

## 2022-03-07 LAB — CBC WITH DIFFERENTIAL/PLATELET
Absolute Monocytes: 577 cells/uL (ref 200–950)
Basophils Absolute: 22 cells/uL (ref 0–200)
Basophils Relative: 0.4 %
Eosinophils Absolute: 22 cells/uL (ref 15–500)
Eosinophils Relative: 0.4 %
HCT: 33.3 % — ABNORMAL LOW (ref 35.0–45.0)
Hemoglobin: 11.6 g/dL — ABNORMAL LOW (ref 11.7–15.5)
Lymphs Abs: 1288 cells/uL (ref 850–3900)
MCH: 35.3 pg — ABNORMAL HIGH (ref 27.0–33.0)
MCHC: 34.8 g/dL (ref 32.0–36.0)
MCV: 101.2 fL — ABNORMAL HIGH (ref 80.0–100.0)
MPV: 9.4 fL (ref 7.5–12.5)
Monocytes Relative: 10.3 %
Neutro Abs: 3690 cells/uL (ref 1500–7800)
Neutrophils Relative %: 65.9 %
Platelets: 335 10*3/uL (ref 140–400)
RBC: 3.29 10*6/uL — ABNORMAL LOW (ref 3.80–5.10)
RDW: 16.7 % — ABNORMAL HIGH (ref 11.0–15.0)
Total Lymphocyte: 23 %
WBC: 5.6 10*3/uL (ref 3.8–10.8)

## 2022-03-07 LAB — COMPLETE METABOLIC PANEL WITH GFR
AG Ratio: 1.7 (calc) (ref 1.0–2.5)
ALT: 16 U/L (ref 6–29)
AST: 27 U/L (ref 10–35)
Albumin: 4 g/dL (ref 3.6–5.1)
Alkaline phosphatase (APISO): 33 U/L — ABNORMAL LOW (ref 37–153)
BUN: 18 mg/dL (ref 7–25)
CO2: 31 mmol/L (ref 20–32)
Calcium: 9.1 mg/dL (ref 8.6–10.4)
Chloride: 97 mmol/L — ABNORMAL LOW (ref 98–110)
Creat: 0.94 mg/dL (ref 0.60–0.95)
Globulin: 2.3 g/dL (calc) (ref 1.9–3.7)
Glucose, Bld: 92 mg/dL (ref 65–99)
Potassium: 5.2 mmol/L (ref 3.5–5.3)
Sodium: 134 mmol/L — ABNORMAL LOW (ref 135–146)
Total Bilirubin: 0.5 mg/dL (ref 0.2–1.2)
Total Protein: 6.3 g/dL (ref 6.1–8.1)
eGFR: 61 mL/min/{1.73_m2} (ref >=60)

## 2022-03-07 LAB — TEST AUTHORIZATION

## 2022-03-07 LAB — B12 AND FOLATE PANEL
Folate: >24 ng/mL
Vitamin B-12: 254 pg/mL (ref 200–1100)

## 2022-03-07 NOTE — Progress Notes (Signed)
Vitamin B12 and folate WNL.

## 2022-03-11 ENCOUNTER — Ambulatory Visit: Payer: Medicare Other | Admitting: Neurology

## 2022-03-11 ENCOUNTER — Encounter: Payer: Self-pay | Admitting: Neurology

## 2022-03-11 ENCOUNTER — Telehealth: Payer: Self-pay | Admitting: Neurology

## 2022-03-11 VITALS — BP 147/81 | HR 72 | Ht 62.0 in | Wt 112.5 lb

## 2022-03-11 DIAGNOSIS — G35 Multiple sclerosis: Secondary | ICD-10-CM

## 2022-03-11 DIAGNOSIS — M5412 Radiculopathy, cervical region: Secondary | ICD-10-CM

## 2022-03-11 DIAGNOSIS — Z9889 Other specified postprocedural states: Secondary | ICD-10-CM | POA: Diagnosis not present

## 2022-03-11 DIAGNOSIS — R200 Anesthesia of skin: Secondary | ICD-10-CM

## 2022-03-11 DIAGNOSIS — G35D Multiple sclerosis, unspecified: Secondary | ICD-10-CM

## 2022-03-11 DIAGNOSIS — R202 Paresthesia of skin: Secondary | ICD-10-CM | POA: Diagnosis not present

## 2022-03-11 DIAGNOSIS — M0579 Rheumatoid arthritis with rheumatoid factor of multiple sites without organ or systems involvement: Secondary | ICD-10-CM | POA: Diagnosis not present

## 2022-03-11 NOTE — Telephone Encounter (Signed)
[employer] medicare NPR sent to GI [phone] 

## 2022-03-11 NOTE — Progress Notes (Signed)
[facility]  PATIENT: Sherri [name] DOB: 1940/08/12  REFERRING CLINICIAN: Unice Cobble  HISTORY FROM: patient REASON FOR VISIT: Multiple sclerosis   HISTORICAL  CHIEF COMPLAINT:  Chief Complaint  Patient presents with   Follow-up    Pt in room #1 and alone. Pt here today for f/u with her MS.Pt state she has numbness from her left elbow to her hand.    HISTORY OF PRESENT ILLNESS:  Sherri [name] is a 81 y.o. woman who was diagnosed with relapsing remitting MS in 2010.     Update 03/11/2022: Her MS is stable off DMT since 2019.  She has had a lot of back issues.   Hardware from surgery in the 190's came loose (L4L5) and she saw Dr. Annette Stable.  She had lumbar surgery March 2023.   She needed to be reoperated on due to the hardware not holding (L3-L5).   She started PT in June to August for neck and shoulder pain.  She has had left arm numbness since September 2023.     She has had C4-C7 ACDF and has known C3C4 and C7T1 anterolisthesis and degenerative change on 2019 MRI - also looks to have possible left C8 radic.   Symptoms are worse in the am.   She notes mild hand weakness but arm is ok  Her right leg jerks at night  reporting more pain in her hands.   She takes tramadol 50 mg x 2 during the day   NCV/EMG 06/28/2019 showed mild C8 radiculopathy and no CTS.  She has RA.   She has more symptoms in her hands.   She is on [name] and MTX which has helped some.   She has OA as well as RA.   She sees Dr. Estanislado Pandy for RA.        MS History:    When she was in her late 57s she had an episode of right-sided numbness and she lost the use of her right arm for a couple of weeks. At that time, she was told that she might have MS. Over the next 30 or 40 years she had several more similar but milder episodes. In 2010, she had an MRI of the cervical spine (for arm pain) and it showed foci c/w multiple sclerosis. An MRI of the brain was also performed  shortly thereafter showing  white matter lesions consistent with the diagnosis.  Initially, she was placed on Avonex. She also has rheumatoid arthritis and was placed on methotrexate and then a combination of methotrexate with [name]. We decided to stop the Avonex as she probably would get some benefit from those medications for the MS as well. For the most part her MS has been stable over the past 5 years.However, early 2016,  she had numbness in the left face and weakness in her legs.  She was on Aubagio from 2016 to 2019 when she stopped all DMT   REVIEW OF SYSTEMS:  Constitutional: No fevers, chills, sweats, or change in appetite Eyes: No visual changes, double vision, eye pain Ear, nose and throat: No hearing loss, ear pain, nasal congestion, sore throat Cardiovascular: No chest pain, palpitations Respiratory:  No shortness of breath at rest or with exertion.   No wheezes GastrointestinaI: No nausea, vomiting, diarrhea, abdominal pain, fecal incontinence Genitourinary:  as above. Musculoskeletal:  No neck pain, back pain Integumentary: No rash, pruritus, skin lesions Neurological: as above Psychiatric: he has had a lot of family stress leading to some depression  and more anxiety. Endocrine: No palpitations, diaphoresis, change in appetite, change in weigh or increased thirst Hematologic/Lymphatic:  No anemia, purpura, petechiae. Allergic/Immunologic: No itchy/runny eyes, nasal congestion, recent allergic reactions, rashes  ALLERGIES: Allergies  Allergen Reactions   Darvon [Propoxyphene Hcl] Shortness Of Breath    ? Dose Related ? Lowered respirations greatly   Demerol [Meperidine] Shortness Of Breath and Other (See Comments)    Respiratory Distress   Penicillins Hives and Other (See Comments)    Has patient had a PCN reaction causing immediate rash, facial/tongue/throat swelling, SOB or lightheadedness with hypotension: No SEVERE RASH INVOLVING MUCUS MEMBRANES or SKIN NECROSIS: #  #  #  YES  #  #  #  Has  patient had a PCN reaction that required hospitalization No Has patient had a PCN reaction occurring within the last 10 years: No.    Imuran [Azathioprine] Other (See Comments)    Weakness   Sulfa Antibiotics Nausea Only and Other (See Comments)    States had severe indigestion and heartburn and nausea and had to stop taking   Arava [Leflunomide] Other (See Comments)    Excessive weight gain   Lipitor [Atorvastatin] Nausea And Vomiting   Vytorin [Ezetimibe-Simvastatin] Nausea And Vomiting    HOME MEDICATIONS:   PAST MEDICAL HISTORY: Past Medical History:  Diagnosis Date   Anxiety    takes Valium daily as needed   Basal cell carcinoma 01/21/1988   left nostril (MOHS), sup-right calf (CX35FU)   Basal cell carcinoma 03/22/1996   right calf (CX35FU)   Basal cell carcinoma 03/31/1995   left wing nose   Bruises easily    d/t meds   Cancer ([place])    basal cell ca, in situ- uterine    Cataracts, bilateral    removed bilateral   Chronic back pain    Dizziness    r/t to meds   GERD (gastroesophageal reflux disease)    no meds on a regular basis but will take Tums if needed   Headache(784.0)    r/t neck issues   History of bronchitis 6-43yr ago   History of colon polyps    benign   HOH (hard of hearing)    wears hearing aids   Hyperlipidemia    takes Atorvastatin on Mondays and Fridays   Hypertension    Joint pain    Joint swelling    Multiple [place]    Neuromuscular disorder ([place]    Dr. RPark Liter [facility], follows M.S.   Nocturia    PONV (postoperative nausea and vomiting)    trouble urinating after surgery in 2014   Postoperative nausea and vomiting 01/11/2019   Rheumatoid arthritis ([place]    Dr VJani Gravelweekly, RA- hands- knees- feet    Rheumatoid arthritis(714.0)    Dr DEstanislado Pandytakes XMorrie Sheldondaily   Right wrist fracture    Skin cancer    Squamous cell carcinoma of skin 11/02/2001   in situ-right knee (cx337f   Squamous cell  carcinoma of skin 11/12/2011   in situ-left forearm (CX35FU), in situ-left foot (CX35FU)   Squamous cell carcinoma of skin 01/22/2012   in situ-left lower forearm (txpbx)   Squamous cell carcinoma of skin 11/17/2012   right shin (txpbx)   Squamous cell carcinoma of skin 10/28/2013   in situ-right shoulder (CX35FU)   Squamous cell carcinoma of skin 04/28/2014   well diff-right shin (txpbx)   Squamous cell carcinoma of skin 11/02/2014   in situ-Left shin (txpbx)   Squamous cell  carcinoma of skin 12/15/2014   in situ-Left hand (txpbx), bowens-left side chest (txpbx)   Squamous cell carcinoma of skin 05/09/2015   in situ-left hand (txpbx)    Squamous cell carcinoma of skin 05/07/2016   in situ-left inner shin,ant, in situ-left inner shin, post, in situ-left outer forearm, in situ-right knuckle   Squamous cell carcinoma of skin 03/1302018   in situ-left shoulder (txpbx), in situ-right inner shin (txpbx), in situ-top of left foot (txpbx), KA- left forearm (txpbx)   Squamous cell carcinoma of skin 12/02/2016   in situ-left inner shin (txpbx)   Squamous cell carcinoma of skin 03/04/2017   in situ-left outer sup, shin (txpbx), in situ- right 2nd knuckle finger (txpbx), in situ- Left wrist (txpbx)   Squamous cell carcinoma of skin 04/06/2018   in situ-above left knee inner (txpbx)   Squamous cell carcinoma of skin 04/21/2018   in situ-right top hand (txpbx)   Squamous cell carcinoma of skin 07/08/2018   in situ-right lower inner shin (txpbx)   Squamous cell carcinoma of skin 04/27/2019   in situ- Left neck(CX35FU), In situ- right neck (CX35FU)   Urinary retention    sees Dr.Wrenn about 2 times a yr    PAST SURGICAL HISTORY: Past Surgical History:  Procedure Laterality Date   [place]     with TAH   BACK SURGERY     several   BREAST BIOPSY Right    Results were negative   BROW LIFT Bilateral 10/02/2016   Procedure: BILATERAL LOWER LID  BLEPHAROPLASTY;  Surgeon: Clista Bernhardt, MD;  Location: [place] [facility];  Service: Plastics;  Laterality: Bilateral;   CARPAL TUNNEL RELEASE Right    cataracts     CERVICAL FUSION     Dr Joya Salm   CERVICAL FUSION  12/31/2012   Dr Joya Salm   CHEST TUBE INSERTION     for traumatic Pneumothorax   COLONOSCOPY  07/16/2010   normal    COLONOSCOPY W/ POLYPECTOMY  1997   negative since; Dr Deatra Ina   ESOPHAGOGASTRODUODENOSCOPY  07/16/2010   normal   eye lid raise     EYE SURGERY Bilateral    cataracts removed - /w IOL   JOINT REPLACEMENT     LUMBAR FUSION     Dr Joya Salm   NASAL SINUS SURGERY     POSTERIOR CERVICAL FUSION/FORAMINOTOMY N/A 12/31/2012   Procedure: POSTERIOR LATERAL CERVICAL FUSION/FORAMINOTOMY LEVEL 1 CERVICAL THREE-FOUR WITH LATERAL MASS SCREWS;  Surgeon: Floyce Stakes, MD;  Location: [facility];  Service: Neurosurgery;  Laterality: N/A;   PTOSIS REPAIR Bilateral 10/02/2016   Procedure: INTERNAL PTOSIS REPAIR;  Surgeon: Clista Bernhardt, MD;  Location: [facility];  Service: Plastics;  Laterality: Bilateral;   SKIN BIOPSY     THYROID SURGERY     R lobe removed- 1972, has grown back - CT last done- 2018   TOTAL HIP ARTHROPLASTY  12/22/2011   Procedure: TOTAL HIP ARTHROPLASTY;  Surgeon: Kerin Salen, MD;  Location: [facility];  Service: Orthopedics;  Laterality: Left;   TOTAL SHOULDER ARTHROPLASTY     TUBAL LIGATION     UPPER GASTROINTESTINAL ENDOSCOPY  2012   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    FAMILY HISTORY: Family History  Problem Relation Age of Onset   Cancer Mother        pancreatic   Diabetes Mother    Pancreatic cancer Mother    Heart disease Father  Rheumatic   Cancer Father        ? stomach   Stomach cancer Father    Cancer Sister        stomach   Stomach cancer Sister    Cancer Maternal Aunt        X 4; ? primary   Heart disease Paternal Aunt    Cancer Maternal Grandmother        cervical   Colon cancer Other        Aunts   Multiple sclerosis Daughter     Hypertension Neg Hx    Stroke Neg Hx    Esophageal cancer Neg Hx    Rectal cancer Neg Hx     SOCIAL HISTORY:  Social History   Socioeconomic History   Marital status: Married    Spouse name: Quita Skye   Number of children: 2   Years of education: Not on file   Highest education level: Not on file  Occupational History   Occupation: retired  Tobacco Use   Smoking status: Former    Packs/day: 2.50    Years: 20.00    Total pack years: 50.00    Types: Cigarettes    Quit date: 05/26/1981    Years since quitting: 40.8   Smokeless tobacco: Never   Tobacco comments:    Smoked 1963-1983, up to 2.5 ppd  Vaping Use   Vaping Use: Never used  Substance and Sexual Activity   Alcohol use: Yes    Alcohol/week: 14.0 standard drinks of alcohol    Types: 14 Cans of beer per week    Comment: 2% beer   Drug use: Never   Sexual activity: Yes    Birth control/protection: Surgical    Comment: Hysterectomy  Other Topics Concern   Not on file  Social History Narrative   Not on file   Social Determinants of Health   Financial Resource Strain: Low Risk  (02/21/2022)   Overall Financial Resource Strain (CARDIA)    Difficulty of Paying Living Expenses: Not hard at all  Food Insecurity: No Food Insecurity (02/21/2022)   Hunger Vital Sign    Worried About Running Out of Food in the Last Year: Never true    Ran Out of Food in the Last Year: Never true  Transportation Needs: No Transportation Needs (02/21/2022)   PRAPARE - Hydrologist (Medical): No    Lack of Transportation (Non-Medical): No  Physical Activity: Inactive (02/21/2022)   Exercise Vital Sign    Days of Exercise per Week: 0 days    Minutes of Exercise per Session: 0 min  Stress: Stress Concern Present (02/21/2022)   [place]    Feeling of Stress : Rather much  Social Connections: Moderately Isolated (02/21/2022)   Social Connection and  Isolation Panel [NHANES]    Frequency of Communication with Friends and Family: More than three times a week    Frequency of Social Gatherings with Friends and Family: Twice a week    Attends Religious Services: Never    Marine scientist or Organizations: No    Attends Archivist Meetings: Never    Marital Status: Married  Human resources officer Violence: Not At Risk (02/21/2022)   Humiliation, Afraid, Rape, and Kick questionnaire    Fear of Current or Ex-Partner: No    Emotionally Abused: No    Physically Abused: No    Sexually Abused: No     PHYSICAL EXAM  Vitals:   03/11/22 1408  BP: (!) 147/81  Pulse: 72  Weight: 112 lb 8 oz (51 kg)  Height: '5\' 2"'$  (1.575 m)    Body mass index is 20.58 kg/m.   General: The patient is well-developed and well-nourished and in no acute distress  Musculoskeletal: She has mild tenderness over the MCP and CMC joints at the thumb base on the right greater than left.  There are degenerative changes of OA and OA in both hands.   Reduced range of motion in the neck.  Skin: Extremities are without rash or edema.  Neurologic Exam  Mental status: The patient is alert and oriented x 3 at the time of the examination. The patient has apparent normal recent and remote memory, with an apparently normal attention span and concentration ability.   Speech is normal.  CN:  EOMI, Intact facial sensation..  Facial strength is normal.  Trapezius strength is normal..  Palatal elevation is normal and Tongue protrusion midline.    No obvious hearing deficits are noted.  Motor:  Right biceps bulges (she has a history of rupture).   Tone is mildly increased in her legs, left > right.  Strength was 4/5 in the ulnar innervated hand muscles on the left and 4+/5 in the left APB muscle.     Sensory: Sensory testing is intact to touch and vibration in the arms and legs except for reduced sensation over the hyperthenar eminence and the fourth and fifth finger  (dorsal and palmar) on the left.  Coordination: Cerebellar testing reveals good rapid alternating movements in hands . Slightly reduced right heel to shin     Gait and station: Station is stable.  Gait is arthritic and mildly wide.    Tandem gait is wide.  Romberg is negative   Reflexes: Deep tendon reflexes are normal and symmetric.    DIAGNOSTIC DATA (LABS, IMAGING, TESTING) - I reviewed patient records, labs, notes, testing and imaging myself where available.  Lab Results  Component Value Date   WBC 5.6 03/05/2022   HGB 11.6 (L) 03/05/2022   HCT 33.3 (L) 03/05/2022   MCV 101.2 (H) 03/05/2022   PLT 335 03/05/2022      Component Value Date/Time   NA 134 (L) 03/05/2022 1429   NA 139 06/20/2019 1335   NA 136 02/22/2015 0000   K 5.2 03/05/2022 1429   K 4.4 02/22/2015 0000   CL 97 (L) 03/05/2022 1429   CL 98 02/22/2015 0000   CO2 31 03/05/2022 1429   CO2 30 02/22/2015 0000   GLUCOSE 92 03/05/2022 1429   BUN 18 03/05/2022 1429   BUN 10 06/20/2019 1335   CREATININE 0.94 03/05/2022 1429   CALCIUM 9.1 03/05/2022 1429   CALCIUM 9.6 02/22/2015 0000   PROT 6.3 03/05/2022 1429   PROT 6.4 06/20/2019 1335   PROT 6.6 02/22/2015 0000   ALBUMIN 4.2 04/10/2020 0914   ALBUMIN 4.2 06/20/2019 1335   ALBUMIN 4.3 02/22/2015 0000   AST 27 03/05/2022 1429   AST 24 02/22/2015 0000   ALT 16 03/05/2022 1429   ALT 18 02/22/2015 0000   ALKPHOS 29 (L) 04/10/2020 0914   ALKPHOS 34 02/22/2015 0000   BILITOT 0.5 03/05/2022 1429   BILITOT 0.3 06/20/2019 1335   GFRNONAA >60 09/20/2021 1341   GFRNONAA 56 (L) 09/26/2020 1536   GFRAA 65 09/26/2020 1536   Lab Results  Component Value Date   CHOL 214 (H) 06/28/2021   HDL 88 06/28/2021   LDLCALC 105 (  H) 06/28/2021   LDLDIRECT 122.9 11/15/2012   TRIG 110 06/28/2021   CHOLHDL 2.4 06/28/2021        ASSESSMENT AND PLAN  Multiple sclerosis (HCC)  Rheumatoid arthritis involving multiple sites with positive rheumatoid factor  (HCC)  Numbness and tingling in left hand - Plan: MR CERVICAL SPINE WO CONTRAST, DG Cervical Spine With Flex & Extend  Cervical radiculopathy at C8 - Plan: MR CERVICAL SPINE WO CONTRAST, DG Cervical Spine With Flex & Extend  H/O cervical spine surgery - Plan: MR CERVICAL SPINE WO CONTRAST, DG Cervical Spine With Flex & Extend   1.   She will continue off of DMTs for MS. 2.   Continue tramadol bid for pain  3.   MRI cervical spine - I suspect the DJD at C7T1 has worsened as symptoms c/w C8 radiculopathy.  If worse than 2019 will have her see Dr. Annette Stable.  4.   rtc 6 months sooner if problems   Fara Worthy A. Felecia Shelling, MD, PhD 88/32/5498, 2:64 PM Certified in Neurology, Clinical Neurophysiology, Sleep Medicine, Pain Medicine and Neuroimaging  [facility] [address], [place], [place] [address] [phone]

## 2022-03-20 ENCOUNTER — Ambulatory Visit: Payer: Medicare Other | Admitting: Family Medicine

## 2022-03-20 DIAGNOSIS — M5412 Radiculopathy, cervical region: Secondary | ICD-10-CM

## 2022-03-20 MED ORDER — DULOXETINE HCL 20 MG PO CPEP: 20.0000 mg | ORAL_CAPSULE | Freq: Every day | ORAL | 0 refills | Status: DC

## 2022-03-20 NOTE — Progress Notes (Signed)
[facility] Phone: [phone] Subjective:   Fontaine No, am serving as a scribe for Dr. Hulan Saas.  I'm seeing this patient by the request  of:  Binnie Rail, MD  CC: Neck pain  ASN:[id number]  Sherri [name] is a 81 y.o. female coming in with complaint of upper back pain. Has had lumbar and cervical fusions. Cervical fusion 2006. Two surgeries this year on lumbar spine. Second surgery was after she fell. Patient here for L shoulder and neck pain. Patient L forearm and hand are numb. Patient has osteoporosis, RA, MS. Was doing physical therapy but they did not want to continue until patient was seen by physician. Seeing Dr. [name] next week. MRI cervical 03/28/2022 that was ordered by Dr. Felecia Shelling to evaluate C8. Wants to know what will happen if she does not get surgery. Patient helps take care of 65 yo husband.     Past Medical History:  Diagnosis Date   Anxiety    takes Valium daily as needed   Basal cell carcinoma 01/21/1988   left nostril (MOHS), sup-right calf (CX35FU)   Basal cell carcinoma 03/22/1996   right calf (CX35FU)   Basal cell carcinoma 03/31/1995   left wing nose   Bruises easily    d/t meds   Cancer (HCC)    basal cell ca, in situ- uterine    Cataracts, bilateral    removed bilateral   Chronic back pain    Dizziness    r/t to meds   GERD (gastroesophageal reflux disease)    no meds on a regular basis but will take Tums if needed   Headache(784.0)    r/t neck issues   History of bronchitis 6-22yr ago   History of colon polyps    benign   HOH (hard of hearing)    wears hearing aids   Hyperlipidemia    takes Atorvastatin on Mondays and Fridays   Hypertension    Joint pain    Joint swelling    Multiple sclerosis ([facility]    [place]romuscular disorder ([place]    Dr. RPark Liter [facility], follows [facility].   Nocturia    PONV (postoperative nausea and vomiting)    trouble urinating  after surgery in 2014   Postoperative nausea and vomiting 01/11/2019   Rheumatoid arthritis ([place]    Dr VJani Gravelweekly, RA- hands- knees- feet    Rheumatoid arthritis(714.0)    Dr DEstanislado Pandytakes XMorrie Sheldondaily   Right wrist fracture    Skin cancer    Squamous cell carcinoma of skin 11/02/2001   in situ-right knee (cx315f   Squamous cell carcinoma of skin 11/12/2011   in situ-left forearm (CX35FU), in situ-left foot (CX35FU)   Squamous cell carcinoma of skin 01/22/2012   in situ-left lower forearm (txpbx)   Squamous cell carcinoma of skin 11/17/2012   right shin (txpbx)   Squamous cell carcinoma of skin 10/28/2013   in situ-right shoulder (CX35FU)   Squamous cell carcinoma of skin 04/28/2014   well diff-right shin (txpbx)   Squamous cell carcinoma of skin 11/02/2014   in situ-Left shin (txpbx)   Squamous cell carcinoma of skin 12/15/2014   in situ-Left hand (txpbx), bowens-left side chest (txpbx)   Squamous cell carcinoma of skin 05/09/2015   in situ-left hand (txpbx)    Squamous cell carcinoma of skin 05/07/2016   in situ-left inner shin,ant, in situ-left inner shin, post, in situ-left outer forearm, in  situ-right knuckle   Squamous cell carcinoma of skin 03/1302018   in situ-left shoulder (txpbx), in situ-right inner shin (txpbx), in situ-top of left foot (txpbx), KA- left forearm (txpbx)   Squamous cell carcinoma of skin 12/02/2016   in situ-left inner shin (txpbx)   Squamous cell carcinoma of skin 03/04/2017   in situ-left outer sup, shin (txpbx), in situ- right 2nd knuckle finger (txpbx), in situ- Left wrist (txpbx)   Squamous cell carcinoma of skin 04/06/2018   in situ-above left knee inner (txpbx)   Squamous cell carcinoma of skin 04/21/2018   in situ-right top hand (txpbx)   Squamous cell carcinoma of skin 07/08/2018   in situ-right lower inner shin (txpbx)   Squamous cell carcinoma of skin 04/27/2019   in situ- Left neck(CX35FU), In situ- right neck  (CX35FU)   Urinary retention    sees Dr.Wrenn about 2 times a yr   Past Surgical History:  Procedure Laterality Date   Oval     with TAH   BACK SURGERY     several   BREAST BIOPSY Right    Results were negative   BROW LIFT Bilateral 10/02/2016   Procedure: BILATERAL LOWER LID BLEPHAROPLASTY;  Surgeon: Clista Bernhardt, MD;  Location: [facility];  Service: Plastics;  Laterality: Bilateral;   CARPAL TUNNEL RELEASE Right    cataracts     CERVICAL FUSION     Dr Joya Salm   CERVICAL FUSION  12/31/2012   Dr Joya Salm   CHEST TUBE INSERTION     for traumatic Pneumothorax   COLONOSCOPY  07/16/2010   normal    COLONOSCOPY W/ POLYPECTOMY  1997   negative since; Dr Deatra Ina   ESOPHAGOGASTRODUODENOSCOPY  07/16/2010   normal   eye lid raise     EYE SURGERY Bilateral    cataracts removed - /w IOL   JOINT REPLACEMENT     LUMBAR FUSION     Dr Joya Salm   NASAL SINUS SURGERY     POSTERIOR CERVICAL FUSION/FORAMINOTOMY N/A 12/31/2012   Procedure: POSTERIOR LATERAL CERVICAL FUSION/FORAMINOTOMY LEVEL 1 CERVICAL THREE-FOUR WITH LATERAL MASS SCREWS;  Surgeon: Floyce Stakes, MD;  Location: [facility];  Service: Neurosurgery;  Laterality: N/A;   PTOSIS REPAIR Bilateral 10/02/2016   Procedure: INTERNAL PTOSIS REPAIR;  Surgeon: Clista Bernhardt, MD;  Location: [facility];  Service: Plastics;  Laterality: Bilateral;   SKIN BIOPSY     THYROID SURGERY     R lobe removed- 1972, has grown back - CT last done- 2018   TOTAL HIP ARTHROPLASTY  12/22/2011   Procedure: TOTAL HIP ARTHROPLASTY;  Surgeon: Kerin Salen, MD;  Location: [facility];  Service: Orthopedics;  Laterality: Left;   TOTAL SHOULDER ARTHROPLASTY     TUBAL LIGATION     UPPER GASTROINTESTINAL ENDOSCOPY  2012   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   Social History   Socioeconomic History   Marital status: Married    Spouse name: Quita Skye   Number of children: 2   Years of education: Not on file   Highest education  level: Not on file  Occupational History   Occupation: retired  Tobacco Use   Smoking status: Former    Packs/day: 2.50    Years: 20.00    Total pack years: 50.00    Types: Cigarettes    Quit date: 05/26/1981    Years since quitting: 40.8   Smokeless tobacco: Never   Tobacco comments:  Smoked [phone], up to 2.5 ppd  Vaping Use   Vaping Use: Never used  Substance and Sexual Activity   Alcohol use: Yes    Alcohol/week: 14.0 standard drinks of alcohol    Types: 14 Cans of beer per week    Comment: 2% beer   Drug use: Never   Sexual activity: Yes    Birth control/protection: Surgical    Comment: Hysterectomy  Other Topics Concern   Not on file  Social History Narrative   Not on file   Social Determinants of Health   Financial Resource Strain: Low Risk  (02/21/2022)   Overall Financial Resource Strain (CARDIA)    Difficulty of Paying Living Expenses: Not hard at all  Food Insecurity: No Food Insecurity (02/21/2022)   Hunger Vital Sign    Worried About Running Out of Food in the Last Year: Never true    Ran Out of Food in the Last Year: Never true  Transportation Needs: No Transportation Needs (02/21/2022)   PRAPARE - Hydrologist (Medical): No    Lack of Transportation (Non-Medical): No  Physical Activity: Inactive (02/21/2022)   Exercise Vital Sign    Days of Exercise per Week: 0 days    Minutes of Exercise per Session: 0 min  Stress: Stress Concern Present (02/21/2022)   Bernice    Feeling of Stress : Rather much  Social Connections: Moderately Isolated (02/21/2022)   Social Connection and Isolation Panel [NHANES]    Frequency of Communication with Friends and Family: More than three times a week    Frequency of Social Gatherings with Friends and Family: Twice a week    Attends Religious Services: Never    Marine scientist or Organizations: No    Attends Theatre manager Meetings: Never    Marital Status: Married   Allergies  Allergen Reactions   Darvon [Propoxyphene Hcl] Shortness Of Breath    ? Dose Related ? Lowered respirations greatly   Demerol [Meperidine] Shortness Of Breath and Other (See Comments)    Respiratory Distress   Penicillins Hives and Other (See Comments)    Has patient had a PCN reaction causing immediate rash, facial/tongue/throat swelling, SOB or lightheadedness with hypotension: No SEVERE RASH INVOLVING MUCUS MEMBRANES or SKIN NECROSIS: #  #  #  YES  #  #  #  Has patient had a PCN reaction that required hospitalization No Has patient had a PCN reaction occurring within the last 10 years: No.    Imuran [Azathioprine] Other (See Comments)    Weakness   Sulfa Antibiotics Nausea Only and Other (See Comments)    States had severe indigestion and heartburn and nausea and had to stop taking   Arava [Leflunomide] Other (See Comments)    Excessive weight gain   Lipitor [Atorvastatin] Nausea And Vomiting   Vytorin [Ezetimibe-Simvastatin] Nausea And Vomiting   Family History  Problem Relation Age of Onset   Cancer Mother        pancreatic   Diabetes Mother    Pancreatic cancer Mother    Heart disease Father        Rheumatic   Cancer Father        ? stomach   Stomach cancer Father    Cancer Sister        stomach   Stomach cancer Sister    Cancer Maternal Aunt        X 4; ?  primary   Heart disease Paternal Aunt    Cancer Maternal Grandmother        cervical   Colon cancer Other        Aunts   Multiple sclerosis Daughter    Hypertension Neg Hx    Stroke Neg Hx    Esophageal cancer Neg Hx    Rectal cancer Neg Hx     Current Outpatient Medications (Endocrine & Metabolic):    denosumab (PROLIA) 60 MG/ML SOSY injection, Inject 60 mg into the skin every 6 (six) months. Deliver to rheum: [address] [place], [place], [place] [address]. Appt on 01/09/22   predniSONE (DELTASONE) 5 MG tablet, 1 tablet by mouth  every morning  Current Outpatient Medications (Cardiovascular):    atorvastatin (LIPITOR) 10 MG tablet, TAKE 1 TAB ON MONDAYS AND FRIDAY   carvedilol (COREG) 6.25 MG tablet, TAKE 1 TABLET BY MOUTH 2 TIMES DAILY WITH A MEAL.   furosemide (LASIX) 20 MG tablet, TAKE 1 TABLET BY MOUTH EVERY DAY   Current Outpatient Medications (Analgesics):    traMADol (ULTRAM) 50 MG tablet, TAKE 1 TABLET (50 MG TOTAL) BY MOUTH 3 (THREE) TIMES DAILY AS NEEDED FOR SEVERE PAIN.   Upadacitinib ER 15 MG TB24, TAKE 1 TABLET (15 MG) BY MOUTH DAILY. (Patient taking differently: Take 15 mg by mouth daily.)  Current Outpatient Medications (Hematological):    folic acid (FOLVITE) 1 MG tablet, Take 1 tablet (1 mg total) by mouth daily.  Current Outpatient Medications (Other):    Biotin 5000 MCG TABS, Take 10,000 mcg by mouth every evening.   Calcium-Magnesium-Zinc (CAL-MAG-ZINC PO), Take 1 tablet by mouth in the morning.   Carboxymethylcellul-Glycerin (LUBRICATING EYE DROPS OP), Place 1 drop into both eyes daily as needed (dry eyes).   cholecalciferol (VITAMIN D) 25 MCG (1000 UNIT) tablet, Take 1,000 Units by mouth in the morning.   cyclobenzaprine (FLEXERIL) 10 MG tablet, Take 1 tablet (10 mg total) by mouth 3 (three) times daily as needed for muscle spasms.   diazepam (VALIUM) 5 MG tablet, TAKE 1 TABLET BY MOUTH AT BEDTIME AS NEEDED FOR MUSCLE SPASMS.   diclofenac sodium (VOLTAREN) 1 % GEL, APPLY 3 GRAMS TO 3 LARGE JOINTS 3 TIMES A DAY AS NEEDED (Patient not taking: Reported on 03/11/2022)   famotidine (PEPCID) 10 MG tablet, Take 10 mg by mouth in the morning.   methotrexate (RHEUMATREX) 2.5 MG tablet, TAKE 6 TABLETS (15 MG TOTAL) BY MOUTH ONCE A WEEK. CAUTION:CHEMOTHERAPY. PROTECT FROM LIGHT.   polyethylene glycol (MIRALAX / GLYCOLAX) 17 g packet, Take 17 g by mouth every other day. In the morning.   TUBERCULIN SYR 1CC/27GX1/2" 27G X 1/2" 1 ML MISC, Use 1 syringe once weekly to inject methotrexate.   Reviewed prior  external information including notes and imaging from  primary care provider As well as notes that were available from care everywhere and other healthcare systems.  Past medical history, social, surgical and family history all reviewed in electronic medical record.  No pertanent information unless stated regarding to the chief complaint.   Review of Systems:  No headache, visual changes, nausea, vomiting, diarrhea, constipation, dizziness, abdominal pain, skin rash, fevers, chills, night sweats, weight loss, swollen lymph nodes, body aches, joint swelling, chest pain, shortness of breath, mood changes. POSITIVE muscle aches  Objective  There were no vitals taken for this visit.   General: No apparent distress alert and oriented x3 mood and affect normal, dressed appropriately.  HEENT: Pupils equal, extraocular movements intact  Respiratory: Patient's speak in full sentences and does not appear short of breath  Patient does move very cautiously.  Neck exam does have significant limited range of motion in multiple planes.  Very mild flexion of the neck still noted.  Only extension of approximately 10 degrees.  Minimal sidebending and has only 10 degrees of right-sided rotation.  Mild crepitus noted.  Patient actually likely does not have any significant weakness in the C8 distribution but does complain of numbness noted.  Numbness seems to be in the C8 distribution.     Impression and Recommendations:    The above documentation has been reviewed and is accurate and complete Lyndal Pulley, DO

## 2022-03-20 NOTE — Patient Instructions (Addendum)
Tart cherry extract '1200mg'$  at night Cymbalta '20mg'$   Get MRI  Possible epidural See me in 6 weeks

## 2022-03-21 ENCOUNTER — Encounter: Payer: Self-pay | Admitting: Family Medicine

## 2022-03-21 NOTE — Assessment & Plan Note (Signed)
Discussed with patient at great length.  Total time with patient and reviewing the chart and discussing different treatment options 41 minutes.  We discussed different medications.  Patient is already getting tramadol 50 mg to take 3 times a day as needed, methotrexate, and we discussed the Cymbalta that I think will be the most beneficial prescribed today of 20 mg daily.  Warned of potential side effects.  Discussed with patient that regarding her neck depending on the MRI I would still be hesitant to do a fusion at C7-T1 and attempt potential epidurals first.  Depending on how patient responds with the epidurals I do think then we can discuss more medical management.  Hoping we will get some relief with the new medication and the Cymbalta.  May be able to discontinue some of the muscle relaxers and other medicines in the long run.  Discussed some other over-the-counter medications as well.  Given some mild home exercises to do follow-up again in 6 to 8 weeks but will talk to her after the MRI to discuss the where an epidural could be the most beneficial.

## 2022-03-26 ENCOUNTER — Ambulatory Visit: Payer: Medicare Other | Admitting: Physician Assistant

## 2022-03-27 DIAGNOSIS — S32049A Unspecified fracture of fourth lumbar vertebra, initial encounter for closed fracture: Secondary | ICD-10-CM | POA: Diagnosis not present

## 2022-03-27 DIAGNOSIS — Z681 Body mass index (BMI) 19 or less, adult: Secondary | ICD-10-CM | POA: Diagnosis not present

## 2022-03-27 DIAGNOSIS — M4310 Spondylolisthesis, site unspecified: Secondary | ICD-10-CM | POA: Diagnosis not present

## 2022-03-28 ENCOUNTER — Ambulatory Visit
Admission: RE | Admit: 2022-03-28 | Discharge: 2022-03-28 | Disposition: A | Discharge status: Home / Self Care | Payer: Medicare Other | Source: Ambulatory Visit | Attending: Neurology | Admitting: Neurology

## 2022-03-28 DIAGNOSIS — M5412 Radiculopathy, cervical region: Secondary | ICD-10-CM | POA: Diagnosis not present

## 2022-03-28 DIAGNOSIS — R200 Anesthesia of skin: Secondary | ICD-10-CM

## 2022-03-28 DIAGNOSIS — Z9889 Other specified postprocedural states: Secondary | ICD-10-CM

## 2022-03-28 DIAGNOSIS — G35 Multiple sclerosis: Secondary | ICD-10-CM | POA: Diagnosis not present

## 2022-03-30 ENCOUNTER — Encounter: Payer: Self-pay | Admitting: Family Medicine

## 2022-04-02 ENCOUNTER — Telehealth: Payer: Self-pay | Admitting: *Deleted

## 2022-04-02 NOTE — Telephone Encounter (Signed)
Patient contacted the office and left message requesting samples of [name]. Patient is up to date on labs.  Patient advised she may come by the office to pick up samples.Patient states she will come by tomorrow to pick them up. Samples reserved in the cabinet for patient.

## 2022-04-03 DIAGNOSIS — Z1231 Encounter for screening mammogram for malignant neoplasm of breast: Secondary | ICD-10-CM | POA: Diagnosis not present

## 2022-04-03 DIAGNOSIS — M4802 Spinal stenosis, cervical region: Secondary | ICD-10-CM | POA: Diagnosis not present

## 2022-04-03 DIAGNOSIS — M4310 Spondylolisthesis, site unspecified: Secondary | ICD-10-CM | POA: Diagnosis not present

## 2022-04-03 DIAGNOSIS — Z6820 Body mass index (BMI) 20.0-20.9, adult: Secondary | ICD-10-CM | POA: Diagnosis not present

## 2022-04-04 ENCOUNTER — Encounter: Payer: Self-pay | Admitting: Internal Medicine

## 2022-04-04 ENCOUNTER — Telehealth: Payer: Self-pay | Admitting: Neurology

## 2022-04-04 NOTE — Telephone Encounter (Signed)
I spoke to Sherri [name] about her cervical spine MRI results.  The MS is stable with a couple T2 hyperintense foci.  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  The changes at C7 and T1 could easily explain the symptoms that have worsened this year.  She saw Dr. Annette Stable a couple days ago and surgery at C7-T1 is being recommended.

## 2022-04-07 ENCOUNTER — Other Ambulatory Visit: Payer: Self-pay | Admitting: Neurosurgery

## 2022-04-08 ENCOUNTER — Other Ambulatory Visit: Payer: Self-pay | Admitting: Family Medicine

## 2022-04-08 ENCOUNTER — Telehealth: Payer: Self-pay | Admitting: Rheumatology

## 2022-04-08 ENCOUNTER — Other Ambulatory Visit: Payer: Self-pay | Admitting: Internal Medicine

## 2022-04-08 NOTE — Telephone Encounter (Signed)
Patient came to the office to pick up samples of [name].  Patient states she is scheduled for back surgery C7-T1 fusion with Dr. [name] on 04/23/22.  Patient states she will hold her [name] and Methotrexate until after her surgery.

## 2022-04-08 NOTE — Telephone Encounter (Signed)
Spoke with patient when she picked up her samples. Patient states she is holding her medication in preparation for her surgery.  Medication Samples have been provided to the patient.  Drug name: [name]       Strength: 15 mg        Qty: 2  LOT: [id number]  Exp.Date: 08/18/2023  Dosing instructions: Take one tablet po daily.

## 2022-04-14 DIAGNOSIS — M816 Localized osteoporosis [Lequesne]: Secondary | ICD-10-CM | POA: Diagnosis not present

## 2022-04-15 NOTE — Progress Notes (Signed)
Surgical Instructions    Your procedure is scheduled on Wednesday, 04/23/22.  Report to [facility] Main Entrance "A" at 6:00 A.M., then check in with the Admitting office.  Call this number if you have problems the morning of surgery:  [phone]   If you have any questions prior to your surgery date call [phone]: Open Monday-Friday 8am-4pm If you experience any cold or flu symptoms such as cough, fever, chills, shortness of breath, etc. between now and your scheduled surgery, please notify us at the above number     Remember:  Do not eat or drink after midnight the night before your surgery     Take these medicines the morning of surgery with A SIP OF WATER:  carvedilol (COREG)  DULoxetine (CYMBALTA)  famotidine (PEPCID)  predniSONE (DELTASONE)  Upadacitinib ER ([name])   IF NEEDED: Carboxymethylcellul-Glycerin (LUBRICATING EYE DROPS OP)  cyclobenzaprine (FLEXERIL)  methenamine (HIPREX)  traMADol (ULTRAM)   As of today, STOP taking any Aspirin (unless otherwise instructed by your surgeon) Aleve, Naproxen, Ibuprofen, Motrin, Advil, Goody's, BC's, all herbal medications, fish oil, and all vitamins.           Do not wear jewelry or makeup. Do not wear lotions, powders, perfumes or deodorant. Do not shave 48 hours prior to surgery.   Do not bring valuables to the hospital. Do not wear nail polish, gel polish, artificial nails, or any other type of covering on natural nails (fingers and toes) If you have artificial nails or gel coating that need to be removed by a nail salon, please have this removed prior to surgery. Artificial nails or gel coating may interfere with anesthesia's ability to adequately monitor your vital signs.  [facility] is not responsible for any belongings or valuables.    Do NOT Smoke (Tobacco/Vaping)  24 hours prior to your procedure  If you use a CPAP at night, you may bring your mask for your overnight stay.   Contacts, glasses, hearing aids,  dentures or partials may not be worn into surgery, please bring cases for these belongings   For patients admitted to the hospital, discharge time will be determined by your treatment team.   Patients discharged the day of surgery will not be allowed to drive home, and someone needs to stay with them for 24 hours.   SURGICAL WAITING ROOM VISITATION Patients having surgery or a procedure may have no more than 2 support people in the waiting area - these visitors may rotate.   Children under the age of 58 must have an adult with them who is not the patient. If the patient needs to stay at the hospital during part of their recovery, the visitor guidelines for inpatient rooms apply. Pre-op nurse will coordinate an appropriate time for 1 support person to accompany patient in pre-op.  This support person may not rotate.   Please refer to [email or web address].hu for the visitor guidelines for Inpatients (after your surgery is over and you are in a regular room).    Special instructions:    Oral Hygiene is also important to reduce your risk of infection.  Remember - BRUSH YOUR TEETH THE MORNING OF SURGERY WITH YOUR REGULAR TOOTHPASTE   [place] [facility]- Preparing For Surgery  Before surgery, you can play an important role. Because skin is not sterile, your skin needs to be as free of germs as possible. You can reduce the number of germs on your skin by washing with CHG (chlorahexidine gluconate) Soap before surgery.  CHG is  an antiseptic cleaner which kills germs and bonds with the skin to continue killing germs even after washing.     Please do not use if you have an allergy to CHG or antibacterial soaps. If your skin becomes reddened/irritated stop using the CHG.  Do not shave (including legs and underarms) for at least 48 hours prior to first CHG shower. It is OK to shave your face.  Please follow these instructions carefully.     Shower the NIGHT  BEFORE SURGERY and the MORNING OF SURGERY with CHG Soap.   If you chose to wash your hair, wash your hair first as usual with your normal shampoo. After you shampoo, rinse your hair and body thoroughly to remove the shampoo.  Then [employer] and genitals (private parts) with your normal soap and rinse thoroughly to remove soap.  After that Use CHG Soap as you would any other liquid soap. You can apply CHG directly to the skin and wash gently with a scrungie or a clean washcloth.   Apply the CHG Soap to your body ONLY FROM THE NECK DOWN.  Do not use on open wounds or open sores. Avoid contact with your eyes, ears, mouth and genitals (private parts). Wash Face and genitals (private parts)  with your normal soap.   Wash thoroughly, paying special attention to the area where your surgery will be performed.  Thoroughly rinse your body with warm water from the neck down.  DO NOT shower/wash with your normal soap after using and rinsing off the CHG Soap.  Pat yourself dry with a CLEAN TOWEL.  Wear CLEAN PAJAMAS to bed the night before surgery  Place CLEAN SHEETS on your bed the night before your surgery  DO NOT SLEEP WITH PETS.   Day of Surgery: Take a shower with CHG soap. Wear Clean/Comfortable clothing the morning of surgery Do not apply any deodorants/lotions.   Remember to brush your teeth WITH YOUR REGULAR TOOTHPASTE.    If you received a COVID test during your pre-op visit, it is requested that you wear a mask when out in public, stay away from anyone that may not be feeling well, and notify your surgeon if you develop symptoms. If you have been in contact with anyone that has tested positive in the last 10 days, please notify your surgeon.    Please read over the following fact sheets that you were given.

## 2022-04-16 ENCOUNTER — Encounter (HOSPITAL_COMMUNITY): Payer: Self-pay

## 2022-04-16 ENCOUNTER — Encounter (HOSPITAL_COMMUNITY)
Admission: RE | Admit: 2022-04-16 | Discharge: 2022-04-16 | Disposition: A | Discharge status: Home / Self Care | Payer: Medicare Other | Source: Ambulatory Visit | Attending: Neurosurgery | Admitting: Neurosurgery

## 2022-04-16 ENCOUNTER — Other Ambulatory Visit: Payer: Self-pay

## 2022-04-16 VITALS — BP 141/80 | HR 88 | Temp 97.8°F | Resp 17 | Ht 62.0 in | Wt 112.0 lb

## 2022-04-16 DIAGNOSIS — Z01812 Encounter for preprocedural laboratory examination: Secondary | ICD-10-CM | POA: Insufficient documentation

## 2022-04-16 DIAGNOSIS — I10 Essential (primary) hypertension: Secondary | ICD-10-CM | POA: Diagnosis not present

## 2022-04-16 DIAGNOSIS — Z01818 Encounter for other preprocedural examination: Secondary | ICD-10-CM

## 2022-04-16 LAB — BASIC METABOLIC PANEL
Anion gap: 8 (ref 5–15)
BUN: 11 mg/dL (ref 8–23)
CO2: 28 mmol/L (ref 22–32)
Calcium: 9.1 mg/dL (ref 8.9–10.3)
Chloride: 102 mmol/L (ref 98–111)
Creatinine, Ser: 0.94 mg/dL (ref 0.44–1.00)
GFR, Estimated: >60 mL/min (ref >60)
Glucose, Bld: 110 mg/dL — ABNORMAL HIGH (ref 70–99)
Potassium: 4.5 mmol/L (ref 3.5–5.1)
Sodium: 138 mmol/L (ref 135–145)

## 2022-04-16 LAB — CBC
HCT: 37.3 % (ref 36.0–46.0)
Hemoglobin: 12.5 g/dL (ref 12.0–15.0)
MCH: 36.2 pg — ABNORMAL HIGH (ref 26.0–34.0)
MCHC: 33.5 g/dL (ref 30.0–36.0)
MCV: 108.1 fL — ABNORMAL HIGH (ref 80.0–100.0)
Platelets: 235 10*3/uL (ref 150–400)
RBC: 3.45 MIL/uL — ABNORMAL LOW (ref 3.87–5.11)
RDW: 14.4 % (ref 11.5–15.5)
WBC: 5.7 10*3/uL (ref 4.0–10.5)
nRBC: 0 % (ref 0.0–0.2)

## 2022-04-16 LAB — SURGICAL PCR SCREEN
MRSA, PCR: NEGATIVE
Staphylococcus aureus: NEGATIVE

## 2022-04-16 NOTE — Progress Notes (Signed)
PCP - Celso Amy Cardiologist - denies Neurologist: Arlice Colt Rheumatologist: Estanislado Pandy  PPM/ICD - denies  Chest x-ray - n/a EKG - 08/15/21 Stress Test - 2002 ECHO - 04/20/17 Cardiac Cath - denies  Sleep Study - denies  Follow your surgeon's instructions on when to stop Aspirin.  If no instructions were given by your surgeon then you will need to call the office to get those instructions.    She has stopped [name] and methotrexate per rheumatologists instructions. Last dose was 04/02/22.  ERAS Protcol -no   COVID TEST- not needed   Anesthesia review: yes, history of MS and RA  Patient denies shortness of breath, fever, cough and chest pain at PAT appointment   All instructions explained to the patient, with a verbal understanding of the material. Patient agrees to go over the instructions while at home for a better understanding. Patient also instructed to self quarantine after being tested for COVID-19. The opportunity to ask questions was provided.

## 2022-04-22 DIAGNOSIS — M4802 Spinal stenosis, cervical region: Secondary | ICD-10-CM | POA: Diagnosis not present

## 2022-04-23 ENCOUNTER — Inpatient Hospital Stay (HOSPITAL_COMMUNITY)
Admission: AD | Disposition: A | Discharge status: Home / Self Care | Payer: Self-pay | Source: Home / Self Care | Attending: Neurosurgery

## 2022-04-23 ENCOUNTER — Ambulatory Visit (HOSPITAL_COMMUNITY): Payer: Medicare Other | Admitting: Vascular Surgery

## 2022-04-23 ENCOUNTER — Other Ambulatory Visit: Payer: Self-pay

## 2022-04-23 ENCOUNTER — Ambulatory Visit (HOSPITAL_BASED_OUTPATIENT_CLINIC_OR_DEPARTMENT_OTHER): Payer: Medicare Other | Admitting: Physician Assistant

## 2022-04-23 ENCOUNTER — Ambulatory Visit (HOSPITAL_COMMUNITY): Payer: Medicare Other

## 2022-04-23 ENCOUNTER — Inpatient Hospital Stay (HOSPITAL_COMMUNITY)
Admission: AD | Admit: 2022-04-23 | Discharge: 2022-04-24 | DRG: 472 | Disposition: A | Discharge status: Home / Self Care | Payer: Medicare Other | Attending: Neurosurgery | Admitting: Neurosurgery

## 2022-04-23 ENCOUNTER — Encounter (HOSPITAL_COMMUNITY): Payer: Self-pay | Admitting: Neurosurgery

## 2022-04-23 DIAGNOSIS — G8929 Other chronic pain: Secondary | ICD-10-CM | POA: Diagnosis present

## 2022-04-23 DIAGNOSIS — M069 Rheumatoid arthritis, unspecified: Secondary | ICD-10-CM | POA: Diagnosis present

## 2022-04-23 DIAGNOSIS — M5003 Cervical disc disorder with myelopathy, cervicothoracic region: Secondary | ICD-10-CM

## 2022-04-23 DIAGNOSIS — Z9842 Cataract extraction status, left eye: Secondary | ICD-10-CM | POA: Diagnosis not present

## 2022-04-23 DIAGNOSIS — M4322 Fusion of spine, cervical region: Secondary | ICD-10-CM | POA: Diagnosis not present

## 2022-04-23 DIAGNOSIS — E785 Hyperlipidemia, unspecified: Secondary | ICD-10-CM | POA: Diagnosis present

## 2022-04-23 DIAGNOSIS — Z820 Family history of epilepsy and other diseases of the nervous system: Secondary | ICD-10-CM

## 2022-04-23 DIAGNOSIS — M4803 Spinal stenosis, cervicothoracic region: Secondary | ICD-10-CM

## 2022-04-23 DIAGNOSIS — Z833 Family history of diabetes mellitus: Secondary | ICD-10-CM

## 2022-04-23 DIAGNOSIS — Z800 Family history of malignant neoplasm of digestive organs: Secondary | ICD-10-CM | POA: Diagnosis not present

## 2022-04-23 DIAGNOSIS — M4722 Other spondylosis with radiculopathy, cervical region: Principal | ICD-10-CM | POA: Diagnosis present

## 2022-04-23 DIAGNOSIS — M2578 Osteophyte, vertebrae: Secondary | ICD-10-CM | POA: Diagnosis present

## 2022-04-23 DIAGNOSIS — Z882 Allergy status to sulfonamides status: Secondary | ICD-10-CM

## 2022-04-23 DIAGNOSIS — F419 Anxiety disorder, unspecified: Secondary | ICD-10-CM | POA: Diagnosis present

## 2022-04-23 DIAGNOSIS — I739 Peripheral vascular disease, unspecified: Secondary | ICD-10-CM | POA: Diagnosis present

## 2022-04-23 DIAGNOSIS — Z961 Presence of intraocular lens: Secondary | ICD-10-CM | POA: Diagnosis present

## 2022-04-23 DIAGNOSIS — I10 Essential (primary) hypertension: Secondary | ICD-10-CM | POA: Diagnosis present

## 2022-04-23 DIAGNOSIS — Z888 Allergy status to other drugs, medicaments and biological substances status: Secondary | ICD-10-CM | POA: Diagnosis not present

## 2022-04-23 DIAGNOSIS — M4802 Spinal stenosis, cervical region: Secondary | ICD-10-CM | POA: Diagnosis not present

## 2022-04-23 DIAGNOSIS — Z8719 Personal history of other diseases of the digestive system: Secondary | ICD-10-CM

## 2022-04-23 DIAGNOSIS — M4723 Other spondylosis with radiculopathy, cervicothoracic region: Secondary | ICD-10-CM | POA: Diagnosis present

## 2022-04-23 DIAGNOSIS — M4712 Other spondylosis with myelopathy, cervical region: Principal | ICD-10-CM | POA: Diagnosis present

## 2022-04-23 DIAGNOSIS — M5013 Cervical disc disorder with radiculopathy, cervicothoracic region: Secondary | ICD-10-CM | POA: Diagnosis not present

## 2022-04-23 DIAGNOSIS — Z86010 Personal history of colonic polyps: Secondary | ICD-10-CM

## 2022-04-23 DIAGNOSIS — Z96619 Presence of unspecified artificial shoulder joint: Secondary | ICD-10-CM | POA: Diagnosis present

## 2022-04-23 DIAGNOSIS — Z9851 Tubal ligation status: Secondary | ICD-10-CM

## 2022-04-23 DIAGNOSIS — Z8249 Family history of ischemic heart disease and other diseases of the circulatory system: Secondary | ICD-10-CM | POA: Diagnosis not present

## 2022-04-23 DIAGNOSIS — Z87448 Personal history of other diseases of urinary system: Secondary | ICD-10-CM

## 2022-04-23 DIAGNOSIS — G992 Myelopathy in diseases classified elsewhere: Secondary | ICD-10-CM | POA: Diagnosis not present

## 2022-04-23 DIAGNOSIS — G9529 Other cord compression: Secondary | ICD-10-CM | POA: Diagnosis not present

## 2022-04-23 DIAGNOSIS — Z974 Presence of external hearing-aid: Secondary | ICD-10-CM

## 2022-04-23 DIAGNOSIS — M4313 Spondylolisthesis, cervicothoracic region: Secondary | ICD-10-CM | POA: Diagnosis not present

## 2022-04-23 DIAGNOSIS — H9190 Unspecified hearing loss, unspecified ear: Secondary | ICD-10-CM | POA: Diagnosis present

## 2022-04-23 DIAGNOSIS — Z7961 Long term (current) use of immunomodulator: Secondary | ICD-10-CM

## 2022-04-23 DIAGNOSIS — Z9841 Cataract extraction status, right eye: Secondary | ICD-10-CM | POA: Diagnosis not present

## 2022-04-23 DIAGNOSIS — Z96642 Presence of left artificial hip joint: Secondary | ICD-10-CM | POA: Diagnosis present

## 2022-04-23 DIAGNOSIS — M4713 Other spondylosis with myelopathy, cervicothoracic region: Principal | ICD-10-CM | POA: Diagnosis present

## 2022-04-23 DIAGNOSIS — Z8049 Family history of malignant neoplasm of other genital organs: Secondary | ICD-10-CM

## 2022-04-23 DIAGNOSIS — Z79899 Other long term (current) drug therapy: Secondary | ICD-10-CM

## 2022-04-23 DIAGNOSIS — Z87891 Personal history of nicotine dependence: Secondary | ICD-10-CM

## 2022-04-23 DIAGNOSIS — Z981 Arthrodesis status: Secondary | ICD-10-CM

## 2022-04-23 DIAGNOSIS — Z8542 Personal history of malignant neoplasm of other parts of uterus: Secondary | ICD-10-CM

## 2022-04-23 DIAGNOSIS — Z880 Allergy status to penicillin: Secondary | ICD-10-CM

## 2022-04-23 DIAGNOSIS — Z90710 Acquired absence of both cervix and uterus: Secondary | ICD-10-CM

## 2022-04-23 DIAGNOSIS — Z885 Allergy status to narcotic agent status: Secondary | ICD-10-CM

## 2022-04-23 DIAGNOSIS — Z79631 Long term (current) use of antimetabolite agent: Secondary | ICD-10-CM

## 2022-04-23 DIAGNOSIS — Z7952 Long term (current) use of systemic steroids: Secondary | ICD-10-CM

## 2022-04-23 DIAGNOSIS — Z85828 Personal history of other malignant neoplasm of skin: Secondary | ICD-10-CM

## 2022-04-23 DIAGNOSIS — M5413 Radiculopathy, cervicothoracic region: Secondary | ICD-10-CM | POA: Diagnosis not present

## 2022-04-23 DIAGNOSIS — G35 Multiple sclerosis: Secondary | ICD-10-CM | POA: Diagnosis present

## 2022-04-23 DIAGNOSIS — M549 Dorsalgia, unspecified: Secondary | ICD-10-CM | POA: Diagnosis present

## 2022-04-23 DIAGNOSIS — K219 Gastro-esophageal reflux disease without esophagitis: Secondary | ICD-10-CM | POA: Diagnosis present

## 2022-04-23 DIAGNOSIS — M810 Age-related osteoporosis without current pathological fracture: Secondary | ICD-10-CM | POA: Diagnosis present

## 2022-04-23 HISTORY — PX: ANTERIOR CERVICAL DECOMP/DISCECTOMY FUSION: SHX1161

## 2022-04-23 SURGERY — ANTERIOR CERVICAL DECOMPRESSION/DISCECTOMY FUSION 1 LEVEL
Anesthesia: General

## 2022-04-23 MED ORDER — PHENOL 1.4 % MT LIQD: 1.0000 | OROMUCOSAL | Status: DC | PRN

## 2022-04-23 MED ORDER — ONDANSETRON HCL 4 MG PO TABS: 4.0000 mg | ORAL_TABLET | Freq: Four times a day (QID) | ORAL | Status: DC | PRN

## 2022-04-23 MED ORDER — PROPOFOL 10 MG/ML IV BOLUS
INTRAVENOUS | Status: DC | PRN
  Administered 2022-04-23: 120 mg via INTRAVENOUS

## 2022-04-23 MED ORDER — UPADACITINIB ER 15 MG PO TB24: 15.0000 mg | ORAL_TABLET | Freq: Every day | ORAL | Status: DC

## 2022-04-23 MED ORDER — HYDROMORPHONE HCL 1 MG/ML IJ SOLN: 0.2500 mg | INTRAMUSCULAR | Status: DC | PRN

## 2022-04-23 MED ORDER — FUROSEMIDE 20 MG PO TABS: 20.0000 mg | ORAL_TABLET | Freq: Every day | ORAL | Status: DC

## 2022-04-23 MED ORDER — SODIUM CHLORIDE 0.9% FLUSH: 3.0000 mL | INTRAVENOUS | Status: DC | PRN

## 2022-04-23 MED ORDER — CARVEDILOL 6.25 MG PO TABS
6.2500 mg | ORAL_TABLET | Freq: Two times a day (BID) | ORAL | Status: DC
  Administered 2022-04-23: 6.25 mg via ORAL
  Filled 2022-04-23: qty 1

## 2022-04-23 MED ORDER — OXYCODONE HCL 5 MG/5ML PO SOLN: 5.0000 mg | Freq: Once | ORAL | Status: DC | PRN

## 2022-04-23 MED ORDER — FENTANYL CITRATE (PF) 250 MCG/5ML IJ SOLN
INTRAMUSCULAR | Status: AC
  Filled 2022-04-23: qty 5

## 2022-04-23 MED ORDER — VANCOMYCIN HCL IN DEXTROSE 1-5 GM/200ML-% IV SOLN
1000.0000 mg | INTRAVENOUS | Status: AC
  Administered 2022-04-23: 1000 mg via INTRAVENOUS
  Filled 2022-04-23: qty 200

## 2022-04-23 MED ORDER — ACETAMINOPHEN 10 MG/ML IV SOLN
INTRAVENOUS | Status: AC
  Filled 2022-04-23: qty 100

## 2022-04-23 MED ORDER — ROCURONIUM BROMIDE 10 MG/ML (PF) SYRINGE
PREFILLED_SYRINGE | INTRAVENOUS | Status: DC | PRN
  Administered 2022-04-23: 10 mg via INTRAVENOUS
  Administered 2022-04-23: 50 mg via INTRAVENOUS

## 2022-04-23 MED ORDER — OXYCODONE HCL 5 MG PO TABS: 5.0000 mg | ORAL_TABLET | Freq: Once | ORAL | Status: DC | PRN

## 2022-04-23 MED ORDER — DEXAMETHASONE SODIUM PHOSPHATE 10 MG/ML IJ SOLN
INTRAMUSCULAR | Status: DC | PRN
  Administered 2022-04-23: 10 mg via INTRAVENOUS

## 2022-04-23 MED ORDER — ORAL CARE MOUTH RINSE: 15.0000 mL | Freq: Once | OROMUCOSAL | Status: AC

## 2022-04-23 MED ORDER — DULOXETINE HCL 20 MG PO CPEP
20.0000 mg | ORAL_CAPSULE | Freq: Every day | ORAL | Status: DC
  Administered 2022-04-23: 20 mg via ORAL
  Filled 2022-04-23 (×2): qty 1

## 2022-04-23 MED ORDER — CHLORHEXIDINE GLUCONATE 0.12 % MT SOLN
15.0000 mL | Freq: Once | OROMUCOSAL | Status: AC
  Administered 2022-04-23: 15 mL via OROMUCOSAL
  Filled 2022-04-23: qty 15

## 2022-04-23 MED ORDER — ONDANSETRON HCL 4 MG/2ML IJ SOLN: 4.0000 mg | Freq: Four times a day (QID) | INTRAMUSCULAR | Status: DC | PRN

## 2022-04-23 MED ORDER — BIOTIN 5000 MCG PO TABS: 10000.0000 ug | ORAL_TABLET | Freq: Every evening | ORAL | Status: DC

## 2022-04-23 MED ORDER — SODIUM CHLORIDE 0.9 % IV SOLN
250.0000 mL | INTRAVENOUS | Status: DC
  Administered 2022-04-23: 250 mL via INTRAVENOUS

## 2022-04-23 MED ORDER — VITAMIN D 25 MCG (1000 UNIT) PO TABS: 1000.0000 [IU] | ORAL_TABLET | Freq: Every morning | ORAL | Status: DC

## 2022-04-23 MED ORDER — ONDANSETRON HCL 4 MG/2ML IJ SOLN
INTRAMUSCULAR | Status: DC | PRN
  Administered 2022-04-23: 4 mg via INTRAVENOUS

## 2022-04-23 MED ORDER — DIAZEPAM 5 MG PO TABS: 5.0000 mg | ORAL_TABLET | Freq: Two times a day (BID) | ORAL | Status: DC | PRN

## 2022-04-23 MED ORDER — THROMBIN 5000 UNITS EX SOLR
CUTANEOUS | Status: AC
  Filled 2022-04-23: qty 10000

## 2022-04-23 MED ORDER — MENTHOL 3 MG MT LOZG: 1.0000 | LOZENGE | OROMUCOSAL | Status: DC | PRN

## 2022-04-23 MED ORDER — CHLORHEXIDINE GLUCONATE CLOTH 2 % EX PADS: 6.0000 | MEDICATED_PAD | Freq: Once | CUTANEOUS | Status: DC

## 2022-04-23 MED ORDER — HYDROMORPHONE HCL 1 MG/ML IJ SOLN: 1.0000 mg | INTRAMUSCULAR | Status: DC | PRN

## 2022-04-23 MED ORDER — FOLIC ACID 1 MG PO TABS
1.0000 mg | ORAL_TABLET | Freq: Every day | ORAL | Status: DC
  Administered 2022-04-23: 1 mg via ORAL
  Filled 2022-04-23: qty 1

## 2022-04-23 MED ORDER — SODIUM CHLORIDE 0.9% FLUSH: 3.0000 mL | Freq: Two times a day (BID) | INTRAVENOUS | Status: DC

## 2022-04-23 MED ORDER — FENTANYL CITRATE (PF) 250 MCG/5ML IJ SOLN
INTRAMUSCULAR | Status: DC | PRN
  Administered 2022-04-23 (×2): 50 ug via INTRAVENOUS
  Administered 2022-04-23: 100 ug via INTRAVENOUS
  Administered 2022-04-23: 50 ug via INTRAVENOUS

## 2022-04-23 MED ORDER — HYDROCODONE-ACETAMINOPHEN 10-325 MG PO TABS: 2.0000 | ORAL_TABLET | ORAL | Status: DC | PRN

## 2022-04-23 MED ORDER — TRAMADOL HCL 50 MG PO TABS: 50.0000 mg | ORAL_TABLET | Freq: Three times a day (TID) | ORAL | Status: DC | PRN

## 2022-04-23 MED ORDER — LACTATED RINGERS IV SOLN: INTRAVENOUS | Status: DC | PRN

## 2022-04-23 MED ORDER — SUGAMMADEX SODIUM 200 MG/2ML IV SOLN
INTRAVENOUS | Status: DC | PRN
  Administered 2022-04-23 (×2): 100 mg via INTRAVENOUS

## 2022-04-23 MED ORDER — CYCLOBENZAPRINE HCL 10 MG PO TABS: 10.0000 mg | ORAL_TABLET | Freq: Three times a day (TID) | ORAL | Status: DC | PRN

## 2022-04-23 MED ORDER — PHENYLEPHRINE 80 MCG/ML (10ML) SYRINGE FOR IV PUSH (FOR BLOOD PRESSURE SUPPORT)
PREFILLED_SYRINGE | INTRAVENOUS | Status: DC | PRN
  Administered 2022-04-23: 80 ug via INTRAVENOUS

## 2022-04-23 MED ORDER — ACETAMINOPHEN 325 MG PO TABS: 650.0000 mg | ORAL_TABLET | ORAL | Status: DC | PRN

## 2022-04-23 MED ORDER — ACETAMINOPHEN 650 MG RE SUPP: 650.0000 mg | RECTAL | Status: DC | PRN

## 2022-04-23 MED ORDER — PHENYLEPHRINE HCL-NACL 20-0.9 MG/250ML-% IV SOLN
INTRAVENOUS | Status: DC | PRN
  Administered 2022-04-23: 50 ug/min via INTRAVENOUS

## 2022-04-23 MED ORDER — FAMOTIDINE 20 MG PO TABS: 10.0000 mg | ORAL_TABLET | Freq: Every morning | ORAL | Status: DC

## 2022-04-23 MED ORDER — LACTATED RINGERS IV SOLN: INTRAVENOUS | Status: DC

## 2022-04-23 MED ORDER — PROPOFOL 10 MG/ML IV BOLUS
INTRAVENOUS | Status: AC
  Filled 2022-04-23: qty 20

## 2022-04-23 MED ORDER — 0.9 % SODIUM CHLORIDE (POUR BTL) OPTIME
TOPICAL | Status: DC | PRN
  Administered 2022-04-23: 1000 mL

## 2022-04-23 MED ORDER — LIDOCAINE 2% (20 MG/ML) 5 ML SYRINGE
INTRAMUSCULAR | Status: DC | PRN
  Administered 2022-04-23: 40 mg via INTRAVENOUS

## 2022-04-23 MED ORDER — AMISULPRIDE (ANTIEMETIC) 5 MG/2ML IV SOLN: 10.0000 mg | Freq: Once | INTRAVENOUS | Status: DC | PRN

## 2022-04-23 MED ORDER — CARBOXYMETHYLCELLUL-GLYCERIN 0.5-0.9 % OP SOLN: 1.0000 [drp] | Freq: Every day | OPHTHALMIC | Status: DC | PRN

## 2022-04-23 MED ORDER — PREDNISONE 5 MG PO TABS
5.0000 mg | ORAL_TABLET | Freq: Every day | ORAL | Status: DC
  Filled 2022-04-23: qty 1

## 2022-04-23 MED ORDER — THROMBIN (RECOMBINANT) 5000 UNITS EX SOLR
CUTANEOUS | Status: DC | PRN
  Administered 2022-04-23: 10 mL via TOPICAL

## 2022-04-23 MED ORDER — HYDROCODONE-ACETAMINOPHEN 5-325 MG PO TABS
1.0000 | ORAL_TABLET | ORAL | Status: DC | PRN
  Administered 2022-04-24: 1 via ORAL
  Filled 2022-04-23: qty 1

## 2022-04-23 MED ORDER — ACETAMINOPHEN 10 MG/ML IV SOLN
INTRAVENOUS | Status: DC | PRN
  Administered 2022-04-23: 1000 mg via INTRAVENOUS

## 2022-04-23 MED ORDER — PROMETHAZINE HCL 25 MG/ML IJ SOLN: 6.2500 mg | INTRAMUSCULAR | Status: DC | PRN

## 2022-04-23 SURGICAL SUPPLY — 54 items
ADH SKN CLS APL DERMABOND .7 (GAUZE/BANDAGES/DRESSINGS) ×1
APL SKNCLS STERI-STRIP NONHPOA (GAUZE/BANDAGES/DRESSINGS) ×1
BAG COUNTER SPONGE SURGICOUNT (BAG) ×2 IMPLANT
BAG DECANTER FOR FLEXI CONT (MISCELLANEOUS) ×2 IMPLANT
BAG SPNG CNTER NS LX DISP (BAG) ×2
BAND INSRT 18 STRL LF DISP RB (MISCELLANEOUS) ×2
BAND RUBBER #18 3X1/16 STRL (MISCELLANEOUS) ×4 IMPLANT
BENZOIN TINCTURE PRP APPL 2/3 (GAUZE/BANDAGES/DRESSINGS) ×2 IMPLANT
BIT DRILL 13 (BIT) IMPLANT
BUR MATCHSTICK NEURO 3.0 LAGG (BURR) ×2 IMPLANT
CAGE END CAP 9X14X11 (Cage) IMPLANT
CAGE PEEK 11X14X11 (Cage) IMPLANT
CANISTER SUCT 3000ML PPV (MISCELLANEOUS) ×2 IMPLANT
DERMABOND ADVANCED .7 DNX12 (GAUZE/BANDAGES/DRESSINGS) IMPLANT
DRAPE C-ARM 42X72 X-RAY (DRAPES) ×4 IMPLANT
DRAPE LAPAROTOMY 100X72 PEDS (DRAPES) ×2 IMPLANT
DRAPE MICROSCOPE SLANT 54X150 (MISCELLANEOUS) ×2 IMPLANT
DURAPREP 6ML APPLICATOR 50/CS (WOUND CARE) ×2 IMPLANT
ELECT COATED BLADE 2.86 ST (ELECTRODE) ×2 IMPLANT
ELECT REM PT RETURN 9FT ADLT (ELECTROSURGICAL) ×1
ELECTRODE REM PT RTRN 9FT ADLT (ELECTROSURGICAL) ×2 IMPLANT
GAUZE 4X4 16PLY ~~LOC~~+RFID DBL (SPONGE) IMPLANT
GAUZE SPONGE 4X4 12PLY STRL (GAUZE/BANDAGES/DRESSINGS) ×2 IMPLANT
GLOVE ECLIPSE 9.0 STRL (GLOVE) ×2 IMPLANT
GLOVE EXAM NITRILE XL STR (GLOVE) IMPLANT
GOWN STRL REUS W/ TWL LRG LVL3 (GOWN DISPOSABLE) IMPLANT
GOWN STRL REUS W/ TWL XL LVL3 (GOWN DISPOSABLE) IMPLANT
GOWN STRL REUS W/TWL 2XL LVL3 (GOWN DISPOSABLE) IMPLANT
GOWN STRL REUS W/TWL LRG LVL3 (GOWN DISPOSABLE)
GOWN STRL REUS W/TWL XL LVL3 (GOWN DISPOSABLE)
HALTER HD/CHIN CERV TRACTION D (MISCELLANEOUS) ×2 IMPLANT
HEMOSTAT POWDER KIT SURGIFOAM (HEMOSTASIS) IMPLANT
KIT BASIN OR (CUSTOM PROCEDURE TRAY) ×2 IMPLANT
KIT TURNOVER KIT B (KITS) ×2 IMPLANT
NDL SPNL 20GX3.5 QUINCKE YW (NEEDLE) ×2 IMPLANT
NEEDLE SPNL 20GX3.5 QUINCKE YW (NEEDLE) ×1 IMPLANT
NS IRRIG 1000ML POUR BTL (IV SOLUTION) ×2 IMPLANT
PACK LAMINECTOMY NEURO (CUSTOM PROCEDURE TRAY) ×2 IMPLANT
PAD ARMBOARD 7.5X6 YLW CONV (MISCELLANEOUS) ×6 IMPLANT
PLATE ELITE 30MM (Plate) IMPLANT
RASP 3.0MM (RASP) IMPLANT
SCREW ST 13X4XST VA NS SPNE (Screw) IMPLANT
SCREW ST VAR 4 ATL (Screw) ×4 IMPLANT
SPONGE INTESTINAL PEANUT (DISPOSABLE) ×2 IMPLANT
SPONGE SURGIFOAM ABS GEL SZ50 (HEMOSTASIS) ×2 IMPLANT
STRIP CLOSURE SKIN 1/2X4 (GAUZE/BANDAGES/DRESSINGS) ×2 IMPLANT
SUT VIC AB 3-0 SH 8-18 (SUTURE) ×2 IMPLANT
SUT VIC AB 4-0 RB1 18 (SUTURE) ×2 IMPLANT
TAPE CLOTH 4X10 WHT NS (GAUZE/BANDAGES/DRESSINGS) ×2 IMPLANT
TAPE CLOTH SURG 4X10 WHT LF (GAUZE/BANDAGES/DRESSINGS) IMPLANT
TOWEL GREEN STERILE (TOWEL DISPOSABLE) ×2 IMPLANT
TOWEL GREEN STERILE FF (TOWEL DISPOSABLE) ×2 IMPLANT
TRAP SPECIMEN MUCUS 40CC (MISCELLANEOUS) ×2 IMPLANT
WATER STERILE IRR 1000ML POUR (IV SOLUTION) ×2 IMPLANT

## 2022-04-23 NOTE — Transfer of Care (Signed)
Immediate Anesthesia Transfer of Care Note  Patient: Sherri [name]  Procedure(s) Performed: Anterior Cervical Decompression/Discectomy Fusion - Cervical Seven-Thoracic One  Patient Location: PACU  Anesthesia Type:General  Level of Consciousness: awake, drowsy, and patient cooperative  Airway & Oxygen Therapy: Patient Spontanous Breathing and Patient connected to nasal cannula oxygen  Post-op Assessment: Report given to RN and Post -op Vital signs reviewed and stable  Post vital signs: Reviewed and stable  Last Vitals:  Vitals Value Taken Time  BP 141/52 04/23/22 1020  Temp    Pulse 78 04/23/22 1024  Resp 10 04/23/22 1024  SpO2 96 % 04/23/22 1024  Vitals shown include unvalidated device data.  Last Pain:  Vitals:   04/23/22 0714  TempSrc: Oral  PainSc:       Patients Stated Pain Goal: 3 (93/55/21 7471)  Complications: No notable events documented.

## 2022-04-23 NOTE — Anesthesia Postprocedure Evaluation (Signed)
Anesthesia Post Note  Patient: Sherri [name]  Procedure(s) Performed: Anterior Cervical Decompression/Discectomy Fusion - Cervical Seven-Thoracic One     Patient location during evaluation: PACU Anesthesia Type: General Level of consciousness: awake and alert Pain management: pain level controlled Vital Signs Assessment: post-procedure vital signs reviewed and stable Respiratory status: spontaneous breathing, nonlabored ventilation and respiratory function stable Cardiovascular status: blood pressure returned to baseline and stable Postop Assessment: no apparent nausea or vomiting Anesthetic complications: no   No notable events documented.  Last Vitals:  Vitals:   04/23/22 1100 04/23/22 1126  BP: (!) 157/77 (!) 152/73  Pulse: 83 82  Resp: 14 16  Temp:  36.6 C  SpO2: 94% 97%    Last Pain:  Vitals:   04/23/22 1126  TempSrc: Oral  PainSc:                  Lynda Rainwater

## 2022-04-23 NOTE — Brief Op Note (Signed)
04/23/2022  10:04 AM  PATIENT:  Sherri [name]  81 y.o. female  PRE-OPERATIVE DIAGNOSIS:  Stenosis  POST-OPERATIVE DIAGNOSIS:  Stenosis  PROCEDURE:  Procedure(s) with comments: Anterior Cervical Decompression/Discectomy Fusion - Cervical Seven-Thoracic One (N/A) - 3C  SURGEON:  Surgeon(s) and Role:    * Earnie Larsson, MD - Primary  PHYSICIAN ASSISTANT:   ASSISTANTSMearl Latin   ANESTHESIA:   none  EBL:  50 mL   BLOOD ADMINISTERED:none  DRAINS: none   LOCAL MEDICATIONS USED:  NONE  SPECIMEN:  No Specimen  DISPOSITION OF SPECIMEN:  N/A  COUNTS:  YES  TOURNIQUET:  * No tourniquets in log *  DICTATION: .Dragon Dictation  PLAN OF CARE: Admit for overnight observation  PATIENT DISPOSITION:  PACU - hemodynamically stable.   Delay start of Pharmacological VTE agent (>24hrs) due to surgical blood loss or risk of bleeding: yes

## 2022-04-23 NOTE — Anesthesia Preprocedure Evaluation (Signed)
Anesthesia Evaluation  Patient identified by MRN, date of birth, ID band Patient awake    Reviewed: Allergy & Precautions, NPO status , Patient's Chart, lab work & pertinent test results, reviewed documented beta blocker date and time   History of Anesthesia Complications (+) history of anesthetic complications  Airway Mallampati: I  TM Distance: >3 FB Neck ROM: Full    Dental  (+) Teeth Intact, Caps, Dental Advisory Given   Pulmonary former smoker   Pulmonary exam normal breath sounds clear to auscultation       Cardiovascular hypertension, Pt. on medications and Pt. on home beta blockers + Peripheral Vascular Disease  Normal cardiovascular exam Rhythm:Regular Rate:Normal     Neuro/Psych  Headaches  Anxiety     MS- in remission  Neuromuscular disease    GI/Hepatic Neg liver ROS,GERD  Medicated,,  Endo/Other  negative endocrine ROS  Osteoporosis  Renal/GU negative Renal ROS  negative genitourinary   Musculoskeletal  (+) Arthritis , Rheumatoid disorders,  Osteoporosis  S/P cervical fusion C4-C7   Abdominal   Peds  Hematology   Anesthesia Other Findings   Reproductive/Obstetrics Hx/o uterine Ca                             Anesthesia Physical Anesthesia Plan  ASA: 2  Anesthesia Plan: General   Post-op Pain Management: Dilaudid IV and Ofirmev IV (intra-op)*   Induction: Intravenous  PONV Risk Score and Plan: 1 and Treatment may vary due to age or medical condition and Ondansetron  Airway Management Planned: Oral ETT  Additional Equipment:   Intra-op Plan:   Post-operative Plan: Extubation in OR  Informed Consent:   Plan Discussed with:   Anesthesia Plan Comments:         Anesthesia Quick Evaluation

## 2022-04-23 NOTE — Evaluation (Signed)
Occupational Therapy Evaluation and Discharge Summary Patient Details Name: Sherri [name] MRN: [id number] DOB: March 27, 1941 Today's Date: 04/23/2022   History of Present Illness Pt is an 74 female admitted for C7-T1 ACD with interbody fusion with removal of old hardware.  Pt has had past cervical surgeries this year.   Clinical Impression   Pt admitted for the above surgery and has the mild deficits outlined below. Pt is not in need of further therapy services at this time. Pt walked in hallway with and without a walker and felt more comfortable with walker at this time and was more steady with walker. Pt has walker at home.  Pt lives with husband and granddaughter available to assist as needed. Pt is overall supervision with all basic adls and has assist for all housework. No further acute therapy needs at this time.  No equipment needs.      Recommendations for follow up therapy are one component of a multi-disciplinary discharge planning process, led by the attending physician.  Recommendations may be updated based on patient status, additional functional criteria and insurance authorization.   Follow Up Recommendations  No OT follow up     Assistance Recommended at Discharge PRN  Patient can return home with the following Assistance with cooking/housework;Assist for transportation;Help with stairs or ramp for entrance;A little help with bathing/dressing/bathroom    Functional Status Assessment  Patient has not had a recent decline in their functional status  Equipment Recommendations  None recommended by OT    Recommendations for Other Services       Precautions / Restrictions Precautions Precautions: Cervical Required Braces or Orthoses: Cervical Brace Cervical Brace: Soft collar Restrictions Weight Bearing Restrictions: No      Mobility Bed Mobility Overal bed mobility: Modified Independent             General bed mobility comments: Pt has hospital bed at  home.    Transfers Overall transfer level: Needs assistance Equipment used: Rolling walker (2 wheels) Transfers: Sit to/from Stand, Bed to chair/wheelchair/BSC Sit to Stand: Supervision     Step pivot transfers: Supervision     General transfer comment: Cues for hand placement with walker      Balance Overall balance assessment: Mild deficits observed, not formally tested                                         ADL either performed or assessed with clinical judgement   ADL Overall ADL's : Needs assistance/impaired Eating/Feeding: Independent;Sitting   Grooming: Supervision/safety;Standing   Upper Body Bathing: Set up;Sitting   Lower Body Bathing: Supervison/ safety;Sit to/from stand;Cueing for compensatory techniques   Upper Body Dressing : Sitting;Minimal assistance (education on how to donn and doff brace)   Lower Body Dressing: Supervision/safety;Sit to/from stand;Cueing for compensatory techniques Lower Body Dressing Details (indicate cue type and reason): cues to sit in figure 4 Toilet Transfer: Supervision/safety;Ambulation;Rolling walker (2 wheels);Comfort height toilet;Grab bars Toilet Transfer Details (indicate cue type and reason): Pt walked to bathroom with walker. Pt attempted without walker and did well, but feels more comfortable with walker so soon after surgery. Toileting- Clothing Manipulation and Hygiene: Supervision/safety;Sitting/lateral lean       Functional mobility during ADLs: Supervision/safety;Rolling walker (2 wheels) General ADL Comments: Pt doing very well with basic adls so soon after surgery. Granddaughter is available to assist after surgery. Pt is overall S with all adls  currently.  Cues to donn and doff neck brace and cues for LE dressing techniques to follow cervical precautions.     Vision Baseline Vision/History: 0 No visual deficits Ability to See in Adequate Light: 0 Adequate Patient Visual Report: Other  (comment) (Pt with pain in R eye since surgery. Rubbing R eye.  Nurse has pt drops in R eye prior to eval.) Vision Assessment?: No apparent visual deficits     Perception Perception Perception Tested?: No   Praxis Praxis Praxis tested?: Within functional limits    Pertinent Vitals/Pain Pain Assessment Pain Assessment: No/denies pain     Hand Dominance Right   Extremity/Trunk Assessment Upper Extremity Assessment Upper Extremity Assessment: Overall WFL for tasks assessed   Lower Extremity Assessment Lower Extremity Assessment: Overall WFL for tasks assessed   Cervical / Trunk Assessment Cervical / Trunk Assessment: Neck Surgery   Communication Communication Communication: No difficulties   Cognition Arousal/Alertness: Awake/alert Behavior During Therapy: WFL for tasks assessed/performed Overall Cognitive Status: Within Functional Limits for tasks assessed                                       General Comments  mild deficits without use of walker. When pt provided with walker, balance much improved. Pt has a walker at home.    Exercises     Shoulder Instructions      Home Living Family/patient expects to be discharged to:: Private residence Living Arrangements: Spouse/significant other Available Help at Discharge: Family;Available 24 hours/day Type of Home: House Home Access: Stairs to enter;Ramped entrance Entrance Stairs-Number of Steps: 4 Entrance Stairs-Rails: Right Home Layout: Two level;Able to live on main level with bedroom/bathroom Alternate Level Stairs-Number of Steps: flight   Bathroom Shower/Tub: Occupational psychologist: Handicapped height Bathroom Accessibility: Yes How Accessible: Accessible via walker Home Equipment: BSC/3in1;Grab bars - toilet;Rollator (4 wheels);Rolling Walker (2 wheels);Shower seat - built in;Grab bars - tub/shower;Wheelchair - manual;Hospital bed   Additional Comments: lives with spouse that falls  frequently and pt has to call help with life alert button. pt has a cat named Buford Dresser that is grey/ white in color      Prior Functioning/Environment Prior Level of Function : Independent/Modified Independent;Driving             Mobility Comments: Has used walker in past but has not used one since Spring ADLs Comments: Pt has a Chartered certified accountant to do all housework. Pt cooks but not often. Pt can do all adls without assist and is very active        OT Problem List:        OT Treatment/Interventions:      OT Goals(Current goals can be found in the care plan section) Acute Rehab OT Goals Patient Stated Goal: to go home tomorrow OT Goal Formulation: All assessment and education complete, DC therapy  OT Frequency:      Co-evaluation              AM-PAC OT "6 Clicks" Daily Activity     Outcome Measure Help from another person eating meals?: None Help from another person taking care of personal grooming?: None Help from another person toileting, which includes using toliet, bedpan, or urinal?: A Little Help from another person bathing (including washing, rinsing, drying)?: A Little Help from another person to put on and taking off regular upper body clothing?: A Little Help from another  person to put on and taking off regular lower body clothing?: A Little 6 Click Score: 20   End of Session Equipment Utilized During Treatment: Rolling walker (2 wheels) Nurse Communication: Mobility status  Activity Tolerance: Patient tolerated treatment well Patient left: in bed;with call bell/phone within reach;with nursing/sitter in room  OT Visit Diagnosis: Unsteadiness on feet (R26.81)                Time: 4888-9169 OT Time Calculation (min): 26 min Charges:  OT General Charges $OT Visit: 1 Visit OT Evaluation $OT Eval Low Complexity: 1 Low OT Treatments $Self Care/Home Management : 8-22 mins  Glenford Peers 04/23/2022, 12:56 PM

## 2022-04-23 NOTE — Progress Notes (Signed)
Pharmacy Antibiotic Note  Sherri [name] is a 81 y.o. female admitted on 04/23/2022 with surgical prophylaxis.  Pharmacy has been consulted for vancomycin x1 dose 12 hours postop.   Received '1000mg'$  vancomycin at 7am, which will last > 24 hours given patient's weight, age and renal function. No drain per RN.   Plan: No further vancomycin indicated per above   Height: '5\' 2"'$  (157.5 cm) Weight: 49.9 kg (110 lb) IBW/kg (Calculated) : 50.1  Temp (24hrs), Avg:97.8 F (36.6 C), Min:97.3 F (36.3 C), Max:98.2 F (36.8 C)  Recent Labs  Lab 04/16/22 1400  WBC 5.7  CREATININE 0.94    Estimated Creatinine Clearance: 37.6 mL/min (by C-G formula based on SCr of 0.94 mg/dL).    Allergies  Allergen Reactions   Darvon [Propoxyphene Hcl] Shortness Of Breath    ? Dose Related ? Lowered respirations greatly   Demerol [Meperidine] Shortness Of Breath and Other (See Comments)    Respiratory Distress   Penicillins Hives and Other (See Comments)    Has patient had a PCN reaction causing immediate rash, facial/tongue/throat swelling, SOB or lightheadedness with hypotension: No SEVERE RASH INVOLVING MUCUS MEMBRANES or SKIN NECROSIS: #  #  #  YES  #  #  #  Has patient had a PCN reaction that required hospitalization No Has patient had a PCN reaction occurring within the last 10 years: No.    Imuran [Azathioprine] Other (See Comments)    Weakness   Sulfa Antibiotics Nausea Only and Other (See Comments)    States had severe indigestion and heartburn and nausea and had to stop taking   Arava [Leflunomide] Other (See Comments)    Excessive weight gain   Lipitor [Atorvastatin] Nausea And Vomiting    Currently taking 2 times weekly - able to tolerate low dose    Vytorin [Ezetimibe-Simvastatin] Nausea And Vomiting   Benetta Spar, PharmD, BCPS, BCCP Clinical Pharmacist  Please check AMION for all Real phone numbers After 10:00 PM, call [place] [phone]

## 2022-04-23 NOTE — Anesthesia Procedure Notes (Signed)
Procedure Name: Intubation Date/Time: 04/23/2022 8:17 AM  Performed by: Dorthea Cove, CRNAPre-anesthesia Checklist: Patient identified, Emergency Drugs available, Suction available and Patient being monitored Patient Re-evaluated:Patient Re-evaluated prior to induction Oxygen Delivery Method: Circle system utilized Preoxygenation: Pre-oxygenation with 100% oxygen Induction Type: IV induction Ventilation: Mask ventilation without difficulty Laryngoscope Size: Glidescope and 3 Grade View: Grade I Tube type: Oral Tube size: 7.0 mm Number of attempts: 1 Airway Equipment and Method: Stylet and Oral airway Placement Confirmation: ETT inserted through vocal cords under direct vision, positive ETCO2 and breath sounds checked- equal and bilateral Secured at: 21 cm Tube secured with: Tape Dental Injury: Teeth and Oropharynx as per pre-operative assessment  Comments: Elective glidescope due to limited neck mobility

## 2022-04-23 NOTE — Progress Notes (Signed)
Orthopedic Tech Progress Note Patient Details:  Sherri [name] 06-09-1940 [id number]  Per RN patient does not need an extra collar  Patient ID: [name], female   DOB: 1940/11/19, 81 y.o.   MRN: [id number]  Janit Pagan 04/23/2022, 12:51 PM

## 2022-04-23 NOTE — Op Note (Signed)
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  Postoperative diagnosis: Same  Procedure Name: C7-T1 anterior cervical discectomy with interbody fusion utilizing interbody cage, locally harvested autograft, and anterior plate instrumentation  Reexploration of anterior cervical fusion from C4-C7 with removal of hardware.  Surgeon:Rakeem Colley A.Santanna Whitford, M.D.  Asst. Surgeon: Reinaldo Meeker, NP  Anesthesia: General  Indication: 81 year old female status post prior multilevel anterior cervical decompression and fusion surgery by another physician presents with worsening neck and bilateral upper extremity numbness paresthesias and weakness consistent with bilateral C8 radiculopathies but also with elements of cervical myelopathy and progressive gait instability.  Workup demonstrates evidence of severe disc degeneration with degenerative anterolisthesis of C7 on T1 with severe spinal stenosis with ongoing cord compression and severe bilateral neural foraminal stenosis affecting both C8 nerve roots.  Patient presents now for decompression and fusion surgery in hopes of improving her symptoms.  Operative note: After induction of anesthesia, patient positioned supine with neck slightly extended and held in place halter traction.  Patient's anterior cervical region prepped and draped sterilely.  Incision made overlying C7.  Dissection performed on the left.  Retractor placed.  Fluoroscopy used.  Levels confirmed.  Previously placed anterior cervical plate overlying C6 and C7 was dissected free.  The disc space at C7-T1 was identified and incised.  Discectomy was then performed using various instruments down to level of the posterior annulus.  Microscope was then brought to field used throughout the remainder of the discectomy.  Remaining aspects of annulus and osteophytes  removed using high-speed drill down 12 the posterior logical limb.  The patient's bone quality was quite poor and a significant amount of the body T1 was resected during the decompression.  The underlying thecal sac was identified.  A wide decompression then performed undercutting the bodies of C7 and T1.  Decompression then proceeded each neural foramina.  Wide anterior foraminotomies were performed on the course exiting C8 nerve roots bilaterally.  Wound was then irrigated.  An 11 mm Medtronic interbody cage was packed with locally harvested autograft and impacted into place.  The previous plate was cut using a metal cutting bit above the C7 screws and the inferior part of the plate was disassembled and removed.  A 30 mm Medtronic anterior cervical plate was then placed to the C7 and T1 levels.  This then attached under fluoroscopic guidance using 13 mm variable angle screws to each at both levels.  All 4 screws given the final tightening found to be solidly within the bone.  Locking screws engaged both levels.  Final images reveal good position of the cage and the hardware at the proper operative level with normal alignment of spine.  Hemostasis was assured with the bipolar cautery.  Wound is then closed in layers with Vicryl sutures.  Steri-Strips and sterile dressing were applied.  No apparent complications.  Patient tolerated the procedure well and she returns to the recovery room postop.

## 2022-04-23 NOTE — H&P (Signed)
Sherri [name] is an 81 y.o. female.   Chief Complaint: Neck pain HPI: 81 year old female status post prior C3-C7 anterior cervical fusion presents with neck pain with radiating pain numbness and some weakness with some associated gait instability.  Workup demonstrates evidence severe disc generation with degenerative anterolisthesis of C7 on T1 with significant spinal stenosis with ongoing cord compression and significant bilateral C8 neural foraminal stenosis.  Patient presents now for C7-T1 anterior cervical decompression and fusion in hopes of improving her symptoms.  Past Medical History:  Diagnosis Date   Anxiety    takes Valium daily as needed   Basal cell carcinoma 01/21/1988   left nostril (MOHS), sup-right calf (CX35FU)   Basal cell carcinoma 03/22/1996   right calf (CX35FU)   Basal cell carcinoma 03/31/1995   left wing nose   Bruises easily    d/t meds   Cancer (HCC)    basal cell ca, in situ- uterine    Cataracts, bilateral    removed bilateral   Chronic back pain    Dizziness    r/t to meds   GERD (gastroesophageal reflux disease)    no meds on a regular basis but will take Tums if needed   Headache(784.0)    r/t neck issues   History of bronchitis 6-12yr ago   History of colon polyps    benign   HOH (hard of hearing)    wears hearing aids   Hyperlipidemia    takes Atorvastatin on Mondays and Fridays   Hypertension    Joint pain    Joint swelling    Multiple sclerosis ([place]    Neuromuscular disorder ([place]    Dr. RPark Liter [facility], follows [facility]   Nocturia    PONV (postoperative nausea and vomiting)    trouble urinating after surgery in 2014   Postoperative nausea and vomiting 01/11/2019   Rheumatoid arthritis ([place]    Dr VJani Gravelweekly, RA- hands- knees- feet    Rheumatoid arthritis(714.0)    Dr DEstanislado Pandytakes XMorrie Sheldondaily   Right wrist fracture    Skin cancer    Squamous cell carcinoma of skin 11/02/2001   in situ-right knee  (cx333f   Squamous cell carcinoma of skin 11/12/2011   in situ-left forearm (CX35FU), in situ-left foot (CX35FU)   Squamous cell carcinoma of skin 01/22/2012   in situ-left lower forearm (txpbx)   Squamous cell carcinoma of skin 11/17/2012   right shin (txpbx)   Squamous cell carcinoma of skin 10/28/2013   in situ-right shoulder (CX35FU)   Squamous cell carcinoma of skin 04/28/2014   well diff-right shin (txpbx)   Squamous cell carcinoma of skin 11/02/2014   in situ-Left shin (txpbx)   Squamous cell carcinoma of skin 12/15/2014   in situ-Left hand (txpbx), bowens-left side chest (txpbx)   Squamous cell carcinoma of skin 05/09/2015   in situ-left hand (txpbx)    Squamous cell carcinoma of skin 05/07/2016   in situ-left inner shin,ant, in situ-left inner shin, post, in situ-left outer forearm, in situ-right knuckle   Squamous cell carcinoma of skin 03/1302018   in situ-left shoulder (txpbx), in situ-right inner shin (txpbx), in situ-top of left foot (txpbx), KA- left forearm (txpbx)   Squamous cell carcinoma of skin 12/02/2016   in situ-left inner shin (txpbx)   Squamous cell carcinoma of skin 03/04/2017   in situ-left outer sup, shin (txpbx), in situ- right 2nd knuckle finger (txpbx), in situ- Left wrist (txpbx)   Squamous cell carcinoma of skin  04/06/2018   in situ-above left knee inner (txpbx)   Squamous cell carcinoma of skin 04/21/2018   in situ-right top hand (txpbx)   Squamous cell carcinoma of skin 07/08/2018   in situ-right lower inner shin (txpbx)   Squamous cell carcinoma of skin 04/27/2019   in situ- Left neck(CX35FU), In situ- right neck (CX35FU)   Urinary retention    sees Dr.Wrenn about 2 times a yr    Past Surgical History:  Procedure Laterality Date   [place]     with TAH   BACK SURGERY     several   BREAST BIOPSY Right    Results were negative   BROW LIFT Bilateral 10/02/2016   Procedure: BILATERAL LOWER LID  BLEPHAROPLASTY;  Surgeon: Clista Bernhardt, MD;  Location: [facility];  Service: Plastics;  Laterality: Bilateral;   CARPAL TUNNEL RELEASE Right    cataracts     CERVICAL FUSION     Dr Joya Salm   CERVICAL FUSION  12/31/2012   Dr Joya Salm   CHEST TUBE INSERTION     for traumatic Pneumothorax   COLONOSCOPY  07/16/2010   normal    COLONOSCOPY W/ POLYPECTOMY  1997   negative since; Dr Deatra Ina   ESOPHAGOGASTRODUODENOSCOPY  07/16/2010   normal   eye lid raise     EYE SURGERY Bilateral    cataracts removed - /w IOL   JOINT REPLACEMENT     LUMBAR FUSION     Dr Joya Salm   NASAL SINUS SURGERY     POSTERIOR CERVICAL FUSION/FORAMINOTOMY N/A 12/31/2012   Procedure: POSTERIOR LATERAL CERVICAL FUSION/FORAMINOTOMY LEVEL 1 CERVICAL THREE-FOUR WITH LATERAL MASS SCREWS;  Surgeon: Floyce Stakes, MD;  Location: [facility];  Service: Neurosurgery;  Laterality: N/A;   PTOSIS REPAIR Bilateral 10/02/2016   Procedure: INTERNAL PTOSIS REPAIR;  Surgeon: Clista Bernhardt, MD;  Location: [facility];  Service: Plastics;  Laterality: Bilateral;   SKIN BIOPSY     THYROID SURGERY     R lobe removed- 1972, has grown back - CT last done- 2018   TOTAL HIP ARTHROPLASTY  12/22/2011   Procedure: TOTAL HIP ARTHROPLASTY;  Surgeon: Kerin Salen, MD;  Location: [facility];  Service: Orthopedics;  Laterality: Left;   TOTAL SHOULDER ARTHROPLASTY     TUBAL LIGATION     UPPER GASTROINTESTINAL ENDOSCOPY  2012   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    Family History  Problem Relation Age of Onset   Cancer Mother        pancreatic   Diabetes Mother    Pancreatic cancer Mother    Heart disease Father        Rheumatic   Cancer Father        ? stomach   Stomach cancer Father    Cancer Sister        stomach   Stomach cancer Sister    Cancer Maternal Aunt        X 4; ? primary   Heart disease Paternal Aunt    Cancer Maternal Grandmother        cervical   Colon cancer Other        Aunts   Multiple sclerosis Daughter    Hypertension  Neg Hx    Stroke Neg Hx    Esophageal cancer Neg Hx    Rectal cancer Neg Hx    Social History:  reports that she quit smoking about 40 years ago. Her  smoking use included cigarettes. She has a 50.00 pack-year smoking history. She has never used smokeless tobacco. She reports current alcohol use of about 14.0 standard drinks of alcohol per week. She reports that she does not use drugs.  Allergies:  Allergies  Allergen Reactions   Darvon [Propoxyphene Hcl] Shortness Of Breath    ? Dose Related ? Lowered respirations greatly   Demerol [Meperidine] Shortness Of Breath and Other (See Comments)    Respiratory Distress   Penicillins Hives and Other (See Comments)    Has patient had a PCN reaction causing immediate rash, facial/tongue/throat swelling, SOB or lightheadedness with hypotension: No SEVERE RASH INVOLVING MUCUS MEMBRANES or SKIN NECROSIS: #  #  #  YES  #  #  #  Has patient had a PCN reaction that required hospitalization No Has patient had a PCN reaction occurring within the last 10 years: No.    Imuran [Azathioprine] Other (See Comments)    Weakness   Sulfa Antibiotics Nausea Only and Other (See Comments)    States had severe indigestion and heartburn and nausea and had to stop taking   Arava [Leflunomide] Other (See Comments)    Excessive weight gain   Lipitor [Atorvastatin] Nausea And Vomiting    Currently taking 2 times weekly - able to tolerate low dose    Vytorin [Ezetimibe-Simvastatin] Nausea And Vomiting    Medications Prior to Admission  Medication Sig Dispense Refill   atorvastatin (LIPITOR) 10 MG tablet TAKE 1 TAB ON MONDAYS AND FRIDAY 27 tablet 3   Biotin 5000 MCG TABS Take 10,000 mcg by mouth every evening.     Calcium-Magnesium-Zinc (CAL-MAG-ZINC PO) Take 1 tablet by mouth in the morning.     Carboxymethylcellul-Glycerin (LUBRICATING EYE DROPS OP) Place 1 drop into both eyes daily as needed (dry eyes).     carvedilol (COREG) 6.25 MG tablet TAKE 1 TABLET BY MOUTH  TWICE A DAY WITH MEALS 180 tablet 1   cholecalciferol (VITAMIN D) 25 MCG (1000 UNIT) tablet Take 1,000 Units by mouth in the morning.     cyclobenzaprine (FLEXERIL) 10 MG tablet Take 1 tablet (10 mg total) by mouth 3 (three) times daily as needed for muscle spasms. 30 tablet 0   denosumab (PROLIA) 60 MG/ML SOSY injection Inject 60 mg into the skin every 6 (six) months. Deliver to rheum: [address] [place], [place], [place] [address]. Appt on 01/09/22 1 mL 0   diazepam (VALIUM) 5 MG tablet TAKE 1 TABLET BY MOUTH AT BEDTIME AS NEEDED FOR MUSCLE SPASMS. 30 tablet 2   diclofenac sodium (VOLTAREN) 1 % GEL APPLY 3 GRAMS TO 3 LARGE JOINTS 3 TIMES A DAY AS NEEDED 100 g 0   DULoxetine (CYMBALTA) 20 MG capsule TAKE 1 CAPSULE BY MOUTH EVERY DAY 30 capsule 0   famotidine (PEPCID) 10 MG tablet Take 10 mg by mouth in the morning.     folic acid (FOLVITE) 1 MG tablet Take 1 tablet (1 mg total) by mouth daily. 90 tablet 3   furosemide (LASIX) 20 MG tablet TAKE 1 TABLET BY MOUTH EVERY DAY 90 tablet 1   Magnesium Bisglycinate (MAG GLYCINATE PO) Take 400 mg by mouth daily. 200 mg per tab     methotrexate (RHEUMATREX) 2.5 MG tablet TAKE 6 TABLETS (15 MG TOTAL) BY MOUTH ONCE A WEEK. CAUTION:CHEMOTHERAPY. PROTECT FROM LIGHT. (Patient taking differently: Take 15 mg by mouth once a week. Caution:Chemotherapy. Protect from light. Wednesdays) 72 tablet 0   polyethylene glycol (MIRALAX / GLYCOLAX) 17  g packet Take 17 g by mouth every other day. In the morning.     predniSONE (DELTASONE) 5 MG tablet 1 tablet by mouth every morning 90 tablet 1   TART CHERRY PO Take 2,000 mg by mouth daily. 1000 mg per tab     Upadacitinib ER ([name]) 15 MG TB24 Take 15 mg by mouth daily.     methenamine (HIPREX) 1 g tablet Take 1 tablet by mouth 2 (two) times daily as needed (UTI).     traMADol (ULTRAM) 50 MG tablet TAKE 1 TABLET (50 MG TOTAL) BY MOUTH 3 (THREE) TIMES DAILY AS NEEDED FOR SEVERE PAIN. 90 tablet 0   TUBERCULIN SYR 1CC/27GX1/2"  27G X 1/2" 1 ML MISC Use 1 syringe once weekly to inject methotrexate. 12 each 3    No results found. However, due to the size of the patient record, not all encounters were searched. Please check Results Review for a complete set of results. No results found.  Pertinent items noted in HPI and remainder of comprehensive ROS otherwise negative.  Blood pressure 128/79, pulse 78, temperature 98.2 F (36.8 C), temperature source Oral, resp. rate 17, height '5\' 2"'$  (1.575 m), weight 49.9 kg, SpO2 98 %.  Patient is awake and alert.  She is oriented and appropriate.  Speech is fluent.  Judgment insight are intact.  Cranial nerve function normal by.  Motor examination reveals weakness of her grips and intrinsics bilaterally grading out at 4/5.  She has some increased tone and some gait instability in her both lower extremities.  Sensory examination with patchy distal sensory loss predominantly in her C8 distributions bilaterally.  Reflexes are brisk but symmetric.  No evidence of long track signs.  Examination head ears eyes nose and throat is unremarked.  Chest and abdomen are benign.  Extremities are free from injury or deformity. Assessment/Plan C7 and T1 degenerative anterolisthesis with severe stenosis.  Plan C7-T1 anterior cervical discectomy with interbody fusion utilizing interbody cage, local harvested autograft, and anterior plate instrumentation.  Risks and benefits been explained.  Patient wishes to proceed.  Cooper Render Apurva Reily 04/23/2022, 7:44 AM

## 2022-04-24 ENCOUNTER — Encounter (HOSPITAL_COMMUNITY): Payer: Self-pay | Admitting: Neurosurgery

## 2022-04-24 MED ORDER — HYDROCODONE-ACETAMINOPHEN 5-325 MG PO TABS: 1.0000 | ORAL_TABLET | ORAL | 0 refills | Status: DC | PRN

## 2022-04-24 MED FILL — Thrombin For Soln 5000 Unit: CUTANEOUS | Qty: 5000 | Status: AC

## 2022-04-24 NOTE — Discharge Instructions (Signed)

## 2022-04-24 NOTE — Plan of Care (Signed)
Patient alert and oriented, void, VSS, surgical clean and dry, some bruise MD aware no sign of infection. D/c instructions explain and giv Problem: Education: Goal: Ability to verbalize activity precautions or restrictions will improve Outcome: Completed/Met Goal: Knowledge of the prescribed therapeutic regimen will improve Outcome: Completed/Met Goal: Understanding of discharge needs will improve Outcome: Completed/Met  en to the patient. Patient d/c  home per order.

## 2022-04-24 NOTE — Discharge Summary (Signed)
Physician Discharge Summary  Patient ID: Sherri [name] MRN: [id number] DOB/AGE: 80-Feb-1942 80 y.o.  Admit date: 04/23/2022 Discharge date: 04/24/2022  Admission Diagnoses:  Discharge Diagnoses:  Principal Problem:   Cervical spondylosis with myelopathy and radiculopathy   Discharged Condition: good  Hospital Course: Patient admitted to the hospital where she underwent uncomplicated P9-J0 anterior cervical discectomy and fusion.  Postoperative doing well.  Preoperative neck pain numbness and weakness all much improved.  Standing ambulating and voiding without difficulty.  Patient ready for discharge home.  Consults:   Significant Diagnostic Studies:   Treatments:   Discharge Exam: Blood pressure 125/71, pulse 84, temperature 98.6 F (37 C), temperature source Oral, resp. rate 18, height '5\' 2"'$  (1.575 m), weight 49.9 kg, SpO2 92 %. Awake and alert.  Oriented and appropriate.  Motor and sensory function intact.  Wound clean and dry.  Chest and abdomen benign.  Disposition: Discharge disposition: 01-Home or Self Care        Allergies as of 04/24/2022       Reactions   Darvon [propoxyphene Hcl] Shortness Of Breath   ? Dose Related ? Lowered respirations greatly   Demerol [meperidine] Shortness Of Breath, Other (See Comments)   Respiratory Distress   Penicillins Hives, Other (See Comments)   Has patient had a PCN reaction causing immediate rash, facial/tongue/throat swelling, SOB or lightheadedness with hypotension: No SEVERE RASH INVOLVING MUCUS MEMBRANES or SKIN NECROSIS: #  #  #  YES  #  #  #  Has patient had a PCN reaction that required hospitalization No Has patient had a PCN reaction occurring within the last 10 years: No.   Imuran [azathioprine] Other (See Comments)   Weakness   Sulfa Antibiotics Nausea Only, Other (See Comments)   States had severe indigestion and heartburn and nausea and had to stop taking   Arava [leflunomide] Other (See Comments)    Excessive weight gain   Lipitor [atorvastatin] Nausea And Vomiting   Currently taking 2 times weekly - able to tolerate low dose    Vytorin [ezetimibe-simvastatin] Nausea And Vomiting        Medication List     TAKE these medications    atorvastatin 10 MG tablet Commonly known as: LIPITOR TAKE 1 TAB ON MONDAYS AND FRIDAY   Biotin 5000 MCG Tabs Take 10,000 mcg by mouth every evening.   CAL-MAG-ZINC PO Take 1 tablet by mouth in the morning.   carvedilol 6.25 MG tablet Commonly known as: COREG TAKE 1 TABLET BY MOUTH TWICE A DAY WITH MEALS   cholecalciferol 25 MCG (1000 UNIT) tablet Commonly known as: VITAMIN D3 Take 1,000 Units by mouth in the morning.   cyclobenzaprine 10 MG tablet Commonly known as: FLEXERIL Take 1 tablet (10 mg total) by mouth 3 (three) times daily as needed for muscle spasms.   diazepam 5 MG tablet Commonly known as: VALIUM TAKE 1 TABLET BY MOUTH AT BEDTIME AS NEEDED FOR MUSCLE SPASMS.   diclofenac sodium 1 % Gel Commonly known as: VOLTAREN APPLY 3 GRAMS TO 3 LARGE JOINTS 3 TIMES A DAY AS NEEDED   DULoxetine 20 MG capsule Commonly known as: CYMBALTA TAKE 1 CAPSULE BY MOUTH EVERY DAY   famotidine 10 MG tablet Commonly known as: PEPCID Take 10 mg by mouth in the morning.   folic acid 1 MG tablet Commonly known as: FOLVITE Take 1 tablet (1 mg total) by mouth daily.   furosemide 20 MG tablet Commonly known as: LASIX TAKE 1 TABLET BY MOUTH EVERY  DAY   HYDROcodone-acetaminophen 5-325 MG tablet Commonly known as: NORCO/VICODIN Take 1 tablet by mouth every 4 (four) hours as needed for moderate pain ((score 4 to 6)).   LUBRICATING EYE DROPS OP Place 1 drop into both eyes daily as needed (dry eyes).   MAG GLYCINATE PO Take 400 mg by mouth daily. 200 mg per tab   methenamine 1 g tablet Commonly known as: HIPREX Take 1 tablet by mouth 2 (two) times daily as needed (UTI).   methotrexate 2.5 MG tablet Commonly known as: RHEUMATREX TAKE 6  TABLETS (15 MG TOTAL) BY MOUTH ONCE A WEEK. CAUTION:CHEMOTHERAPY. PROTECT FROM LIGHT. What changed: additional instructions   polyethylene glycol 17 g packet Commonly known as: MIRALAX / GLYCOLAX Take 17 g by mouth every other day. In the morning.   predniSONE 5 MG tablet Commonly known as: DELTASONE 1 tablet by mouth every morning   Prolia 60 MG/ML Sosy injection Generic drug: denosumab Inject 60 mg into the skin every 6 (six) months. Deliver to rheum: [address], [place], [place], [place] [address]. Appt on 01/09/22   [name] 15 MG Tb24 Generic drug: Upadacitinib ER Take 15 mg by mouth daily.   TART CHERRY PO Take 2,000 mg by mouth daily. 1000 mg per tab   traMADol 50 MG tablet Commonly known as: ULTRAM TAKE 1 TABLET (50 MG TOTAL) BY MOUTH 3 (THREE) TIMES DAILY AS NEEDED FOR SEVERE PAIN.   TUBERCULIN SYR 1CC/27GX1/2" 27G X 1/2" 1 ML Misc Use 1 syringe once weekly to inject methotrexate.         SignedCooper Render Beck Cofer 04/24/2022, 9:43 AM

## 2022-04-25 ENCOUNTER — Encounter: Payer: Self-pay | Admitting: *Deleted

## 2022-04-25 ENCOUNTER — Telehealth: Payer: Self-pay | Admitting: *Deleted

## 2022-04-25 HISTORY — PX: CERVICAL FUSION: SHX112

## 2022-04-25 NOTE — Patient Outreach (Signed)
  Care Coordination [facility] Note Transition Care Management Follow-up Telephone Call Date of discharge and from where: Thursday, 04/24/22, [place]: C7-T1 anterior cervical discectomy/ cervical spondylosis/ myelopathy/ radiculopathy How have you been since you were released from the hospital? "This surgery has been a piece of cake compared to my other surgeries-- I am doing really just fine.... a little sore but not too awful bad; I am able to do everything for myself to take care of myself, but my granddaughter is staying with [place] for a few days or as long as she needs to to help [place] out.  I am taking my pain medicine mainly at night to help me rest, otherwise things are fine.  I have contacted my rheumatologist to ask about whether or not I should resume the medications he recently stopped- the renvoke and the methotrexate- I am waiting to hear back form them and I know they will call me soon" Any questions or concerns? No  Items Reviewed: Did the pt receive and understand the discharge instructions provided? Yes  Medications obtained and verified? Yes  Other? No  Any new allergies since your discharge? No  Dietary orders reviewed? Yes Do you have support at home? Yes  husband and granddaughter assisting with care needs as indicated; patient reports she is essentially independent in daily care needs  Home Care and Equipment/Supplies: Were home health services ordered? no If so, what is the name of the agency? N/A  Has the agency set up a time to come to the patient's home? not applicable Were any new equipment or medical supplies ordered?  No What is the name of the medical supply agency? N/A Were you able to get the supplies/equipment? not applicable Do you have any questions related to the use of the equipment or supplies? No  N/A  Functional Questionnaire: (I = Independent and D = Dependent) ADLs: I  husband and granddaughter assisting with care needs as indicated  Bathing/Dressing- I   husband and granddaughter assisting with care needs as indicated  Meal Prep- I  husband and granddaughter assisting with care needs as indicated  Eating- I  Maintaining continence- I  Transferring/Ambulation- I  Managing Meds- I  Follow up appointments reviewed:  PCP Hospital f/u appt confirmed? No  Scheduled to see - on - @ [facility] f/u appt confirmed? Yes  Scheduled to see Dr. [name], neurosurgery on Tuesday 05/06/22 @ 2:30 pm Are transportation arrangements needed? No  If their condition worsens, is the pt aware to call PCP or go to the Emergency Dept.? Yes Was the patient provided with contact information for the PCP's office or ED? No- declined, reports already has contact numbers for all care providers Was to pt encouraged to call back with questions or concerns? Yes  SDOH assessments and interventions completed:   Yes SDOH Interventions Today    Flowsheet Row Most Recent Value  SDOH Interventions   Food Insecurity Interventions Intervention Not Indicated  Transportation Interventions Intervention Not Indicated  [patient normally drives self, family assisting with transportation post-surgery]      Care Coordination Interventions:  Reinforced instructions around use of pain medication in setting of recent surgery; reinforced need to have surgical provider clearance prior to resuming driving    Encounter Outcome:  Pt. Visit Completed    Oneta Rack, RN, BSN, CCRN Alumnus RN CM Care Coordination/ Transition of [place] Management [phone]: direct office

## 2022-04-29 NOTE — Progress Notes (Signed)
[name] [place] Phone: [phone] Subjective:   Sherri [name], am serving as a scribe for Dr. Hulan Saas.  I'm seeing this patient by the request  of:  Binnie Rail, MD  CC: Neck pain follow-up  [id number]  03/20/2022 Discussed with patient at great length. Total time with patient and reviewing the chart and discussing different treatment options 41 minutes. We discussed different medications. Patient is already getting tramadol 50 mg to take 3 times a day as needed, methotrexate, and we discussed the Cymbalta that I think will be the most beneficial prescribed today of 20 mg daily. Warned of potential side effects. Discussed with patient that regarding her neck depending on the MRI I would still be hesitant to do a fusion at C7-T1 and attempt potential epidurals first. Depending on how patient responds with the epidurals I do think then we can discuss more medical management. Hoping we will get some relief with the new medication and the Cymbalta. May be able to discontinue some of the muscle relaxers and other medicines in the long run. Discussed some other over-the-counter medications as well. Given some mild home exercises to do follow-up again in 6 to 8 weeks but will talk to her after the MRI to discuss the where an epidural could be the most beneficial.   Update 05/01/2022 Sherri [name] is a 81 y.o. female coming in with complaint of cervical radiculopathy. Cervical fusion C7-T1 on 04/23/2022. Patient states wanted to keep this appointment even tho she feels it may not be necessary. The surgery was pretty easy, but the things she has had to wear after the surgery that has been annoying. Back brace and neck brace a the moment that she has to wear at all times that has been causing her to loose sleep because it digs in the back of her at night.     Past Medical History:  Diagnosis Date   Anxiety    takes  Valium daily as needed   Basal cell carcinoma 01/21/1988   left nostril (MOHS), sup-right calf (CX35FU)   Basal cell carcinoma 03/22/1996   right calf (CX35FU)   Basal cell carcinoma 03/31/1995   left wing nose   Bruises easily    d/t meds   Cancer (HCC)    basal cell ca, in situ- uterine    Cataracts, bilateral    removed bilateral   Chronic back pain    Dizziness    r/t to meds   GERD (gastroesophageal reflux disease)    no meds on a regular basis but will take Tums if needed   Headache(784.0)    r/t neck issues   History of bronchitis 6-99yr ago   History of colon polyps    benign   HOH (hard of hearing)    wears hearing aids   Hyperlipidemia    takes Atorvastatin on Mondays and Fridays   Hypertension    Joint pain    Joint swelling    Multiple sclerosis ([facility]    Ne[place]romuscular disorder ([facility]    Dr. RPark Liter [facility], follows [facility]   Nocturia    PONV (postoperative nausea and vomiting)    trouble urinating after surgery in 2014   Postoperative nausea and vomiting 01/11/2019   Rheumatoid arthritis ([place]    Dr VJani Gravelweekly, RA- hands- knees- feet    Rheumatoid arthritis(714.0)    Dr DEstanislado Pandytakes XMorrie Sheldondaily   Right wrist fracture  Skin cancer    Squamous cell carcinoma of skin 11/02/2001   in situ-right knee (cx23f)   Squamous cell carcinoma of skin 11/12/2011   in situ-left forearm (CX35FU), in situ-left foot (CX35FU)   Squamous cell carcinoma of skin 01/22/2012   in situ-left lower forearm (txpbx)   Squamous cell carcinoma of skin 11/17/2012   right shin (txpbx)   Squamous cell carcinoma of skin 10/28/2013   in situ-right shoulder (CX35FU)   Squamous cell carcinoma of skin 04/28/2014   well diff-right shin (txpbx)   Squamous cell carcinoma of skin 11/02/2014   in situ-Left shin (txpbx)   Squamous cell carcinoma of skin 12/15/2014   in situ-Left hand (txpbx), bowens-left side chest (txpbx)   Squamous cell carcinoma of skin  05/09/2015   in situ-left hand (txpbx)    Squamous cell carcinoma of skin 05/07/2016   in situ-left inner shin,ant, in situ-left inner shin, post, in situ-left outer forearm, in situ-right knuckle   Squamous cell carcinoma of skin 03/1302018   in situ-left shoulder (txpbx), in situ-right inner shin (txpbx), in situ-top of left foot (txpbx), KA- left forearm (txpbx)   Squamous cell carcinoma of skin 12/02/2016   in situ-left inner shin (txpbx)   Squamous cell carcinoma of skin 03/04/2017   in situ-left outer sup, shin (txpbx), in situ- right 2nd knuckle finger (txpbx), in situ- Left wrist (txpbx)   Squamous cell carcinoma of skin 04/06/2018   in situ-above left knee inner (txpbx)   Squamous cell carcinoma of skin 04/21/2018   in situ-right top hand (txpbx)   Squamous cell carcinoma of skin 07/08/2018   in situ-right lower inner shin (txpbx)   Squamous cell carcinoma of skin 04/27/2019   in situ- Left neck(CX35FU), In situ- right neck (CX35FU)   Urinary retention    sees Dr.Wrenn about 2 times a yr   Past Surgical History:  Procedure Laterality Date   ABDOMINAL HYSTERECTOMY     1985   ANTERIOR CERVICAL DECOMP/DISCECTOMY FUSION N/A 04/23/2022   Procedure: Anterior Cervical Decompression/Discectomy Fusion - Cervical Seven-Thoracic One;  Surgeon: PEarnie Larsson MD;  [facility] [facility]  Service: Neurosurgery;  Laterality: N/A;  3C   APPENDECTOMY     with TAH   BACK SURGERY     several   BREAST BIOPSY Right    Results were negative   BROW LIFT Bilateral 10/02/2016   Procedure: BILATERAL LOWER LID BLEPHAROPLASTY;  Surgeon: AClista Bernhardt MD;  [place] [facility]  Service: Plastics;  Laterality: Bilateral;   CARPAL TUNNEL RELEASE Right    cataracts     CERVICAL FUSION     Dr BJoya Salm  CERVICAL FUSION  12/31/2012   Dr BJoya Salm  CHEST TUBE INSERTION     for traumatic Pneumothorax   COLONOSCOPY  07/16/2010   normal    COLONOSCOPY W/ POLYPECTOMY  1997   negative since; Dr KDeatra Ina   ESOPHAGOGASTRODUODENOSCOPY  07/16/2010   normal   eye lid raise     EYE SURGERY Bilateral    cataracts removed - /w IOL   JOINT REPLACEMENT     LUMBAR FUSION     Dr BJoya Salm  NASAL SINUS SURGERY     POSTERIOR CERVICAL FUSION/FORAMINOTOMY N/A 12/31/2012   Procedure: POSTERIOR LATERAL CERVICAL FUSION/FORAMINOTOMY LEVEL 1 CERVICAL THREE-FOUR WITH LATERAL MASS SCREWS;  Surgeon: EFloyce Stakes MD;  [facility];  Service: Neurosurgery;  Laterality: N/A;   PTOSIS REPAIR Bilateral 10/02/2016   Procedure: INTERNAL PTOSIS REPAIR;  Surgeon:  Clista Bernhardt, MD;  Location: [place] [facility];  Service: Plastics;  Laterality: Bilateral;   SKIN BIOPSY     THYROID SURGERY     R lobe removed- 1972, has grown back - CT last done- 2018   TOTAL HIP ARTHROPLASTY  12/22/2011   Procedure: TOTAL HIP ARTHROPLASTY;  Surgeon: Kerin Salen, MD;  Location: [place];  Service: Orthopedics;  Laterality: Left;   TOTAL SHOULDER ARTHROPLASTY     TUBAL LIGATION     UPPER GASTROINTESTINAL ENDOSCOPY  2012   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   Social History   Socioeconomic History   Marital status: Married    Spouse name: Quita Skye   Number of children: 2   Years of education: Not on file   Highest education level: Not on file  Occupational History   Occupation: retired  Tobacco Use   Smoking status: Former    Packs/day: 2.50    Years: 20.00    Total pack years: 50.00    Types: Cigarettes    Quit date: 05/26/1981    Years since quitting: 40.9   Smokeless tobacco: Never   Tobacco comments:    Smoked [phone], up to 2.5 ppd  Vaping Use   Vaping Use: Never used  Substance and Sexual Activity   Alcohol use: Yes    Alcohol/week: 14.0 standard drinks of alcohol    Types: 14 Cans of beer per week    Comment: 2% beer   Drug use: Never   Sexual activity: Yes    Birth control/protection: Surgical    Comment: Hysterectomy  Other Topics Concern   Not on file  Social History Narrative   Not on file   Social  Determinants of Health   Financial Resource Strain: Low Risk  (02/21/2022)   Overall Financial Resource Strain (CARDIA)    Difficulty of Paying Living Expenses: Not hard at all  Food Insecurity: No Food Insecurity (04/25/2022)   Hunger Vital Sign    Worried About Running Out of Food in the Last Year: Never true    Ran Out of Food in the Last Year: Never true  Transportation Needs: No Transportation Needs (04/25/2022)   PRAPARE - Hydrologist (Medical): No    Lack of Transportation (Non-Medical): No  Physical Activity: Inactive (02/21/2022)   Exercise Vital Sign    Days of Exercise per Week: 0 days    Minutes of Exercise per Session: 0 min  Stress: Stress Concern Present (02/21/2022)   [place]    Feeling of Stress : Rather much  Social Connections: Moderately Isolated (02/21/2022)   Social Connection and Isolation Panel [NHANES]    Frequency of Communication with Friends and Family: More than three times a week    Frequency of Social Gatherings with Friends and Family: Twice a week    Attends Religious Services: Never    Marine scientist or Organizations: No    Attends Archivist Meetings: Never    Marital Status: Married   Allergies  Allergen Reactions   Darvon [Propoxyphene Hcl] Shortness Of Breath    ? Dose Related ? Lowered respirations greatly   Demerol [Meperidine] Shortness Of Breath and Other (See Comments)    Respiratory Distress   Penicillins Hives and Other (See Comments)    Has patient had a PCN reaction causing immediate rash, facial/tongue/throat swelling, SOB or lightheadedness with hypotension: No SEVERE RASH INVOLVING MUCUS MEMBRANES or SKIN  NECROSIS: #  #  #  YES  #  #  #  Has patient had a PCN reaction that required hospitalization No Has patient had a PCN reaction occurring within the last 10 years: No.    Imuran [Azathioprine] Other (See Comments)     Weakness   Sulfa Antibiotics Nausea Only and Other (See Comments)    States had severe indigestion and heartburn and nausea and had to stop taking   Arava [Leflunomide] Other (See Comments)    Excessive weight gain   Lipitor [Atorvastatin] Nausea And Vomiting    Currently taking 2 times weekly - able to tolerate low dose    Vytorin [Ezetimibe-Simvastatin] Nausea And Vomiting   Family History  Problem Relation Age of Onset   Cancer Mother        pancreatic   Diabetes Mother    Pancreatic cancer Mother    Heart disease Father        Rheumatic   Cancer Father        ? stomach   Stomach cancer Father    Cancer Sister        stomach   Stomach cancer Sister    Cancer Maternal Aunt        X 4; ? primary   Heart disease Paternal Aunt    Cancer Maternal Grandmother        cervical   Colon cancer Other        Aunts   Multiple sclerosis Daughter    Hypertension Neg Hx    Stroke Neg Hx    Esophageal cancer Neg Hx    Rectal cancer Neg Hx     Current Outpatient Medications (Endocrine & Metabolic):    denosumab (PROLIA) 60 MG/ML SOSY injection, Inject 60 mg into the skin every 6 (six) months. Deliver to rheum: [address], [place], [place], [place] [address]. Appt on 01/09/22   predniSONE (DELTASONE) 5 MG tablet, 1 tablet by mouth every morning  Current Outpatient Medications (Cardiovascular):    atorvastatin (LIPITOR) 10 MG tablet, TAKE 1 TAB ON MONDAYS AND FRIDAY   carvedilol (COREG) 6.25 MG tablet, TAKE 1 TABLET BY MOUTH TWICE A DAY WITH MEALS   furosemide (LASIX) 20 MG tablet, TAKE 1 TABLET BY MOUTH EVERY DAY   Current Outpatient Medications (Analgesics):    HYDROcodone-acetaminophen (NORCO/VICODIN) 5-325 MG tablet, Take 1 tablet by mouth every 4 (four) hours as needed for moderate pain ((score 4 to 6)).   traMADol (ULTRAM) 50 MG tablet, TAKE 1 TABLET (50 MG TOTAL) BY MOUTH 3 (THREE) TIMES DAILY AS NEEDED FOR SEVERE PAIN.   Upadacitinib ER ([name]) 15 MG TB24, Take 15 mg by  mouth daily.  Current Outpatient Medications (Hematological):    folic acid (FOLVITE) 1 MG tablet, Take 1 tablet (1 mg total) by mouth daily.  Current Outpatient Medications (Other):    Biotin 5000 MCG TABS, Take 10,000 mcg by mouth every evening.   Calcium-Magnesium-Zinc (CAL-MAG-ZINC PO), Take 1 tablet by mouth in the morning.   Carboxymethylcellul-Glycerin (LUBRICATING EYE DROPS OP), Place 1 drop into both eyes daily as needed (dry eyes).   cholecalciferol (VITAMIN D) 25 MCG (1000 UNIT) tablet, Take 1,000 Units by mouth in the morning.   cyclobenzaprine (FLEXERIL) 10 MG tablet, Take 1 tablet (10 mg total) by mouth 3 (three) times daily as needed for muscle spasms.   diazepam (VALIUM) 5 MG tablet, TAKE 1 TABLET BY MOUTH AT BEDTIME AS NEEDED FOR MUSCLE SPASMS.   diclofenac sodium (VOLTAREN) 1 %  GEL, APPLY 3 GRAMS TO 3 LARGE JOINTS 3 TIMES A DAY AS NEEDED   DULoxetine (CYMBALTA) 20 MG capsule, TAKE 1 CAPSULE BY MOUTH EVERY DAY   famotidine (PEPCID) 10 MG tablet, Take 10 mg by mouth in the morning.   gabapentin (NEURONTIN) 100 MG capsule, Take 1 capsule (100 mg total) by mouth at bedtime.   Magnesium Bisglycinate (MAG GLYCINATE PO), Take 400 mg by mouth daily. 200 mg per tab   methenamine (HIPREX) 1 g tablet, Take 1 tablet by mouth 2 (two) times daily as needed (UTI).   methotrexate (RHEUMATREX) 2.5 MG tablet, TAKE 6 TABLETS (15 MG TOTAL) BY MOUTH ONCE A WEEK. CAUTION:CHEMOTHERAPY. PROTECT FROM LIGHT. (Patient taking differently: Take 15 mg by mouth once a week. Caution:Chemotherapy. Protect from light. Wednesdays)   polyethylene glycol (MIRALAX / GLYCOLAX) 17 g packet, Take 17 g by mouth every other day. In the morning.   TART CHERRY PO, Take 2,000 mg by mouth daily. 1000 mg per tab   TUBERCULIN SYR 1CC/27GX1/2" 27G X 1/2" 1 ML MISC, Use 1 syringe once weekly to inject methotrexate.   Reviewed prior external information including notes and imaging from  primary care provider As well as  notes that were available from care everywhere and other healthcare systems.  Past medical history, social, surgical and family history all reviewed in electronic medical record.  No pertanent information unless stated regarding to the chief complaint.   Review of Systems:  No headache, visual changes, nausea, vomiting, diarrhea, constipation, dizziness, abdominal pain, skin rash, fevers, chills, night sweats, weight loss, swollen lymph nodes,  chest pain, shortness of breath, mood changes. POSITIVE muscle aches, body aches, joint swelling  Objective  Blood pressure 122/68, pulse 60, height '5\' 2"'$  (1.575 m), weight 110 lb (49.9 kg), SpO2 98 %.   General: No apparent distress alert and oriented x3 mood and affect normal, dressed appropriately.  HEENT: Pupils equal, extraocular movements intact  Respiratory: Patient's speak in full sentences and does not appear short of breath  Cardiovascular: No lower extremity edema, non tender, no erythema  Neck pain does have some loss of lordosis.  Patient is wearing a soft collar around the neck.  Did not remove it today.  Patient has good strength in the hands.  Neurovascularly intact distally.    Impression and Recommendations:    The above documentation has been reviewed and is accurate and complete Lyndal Pulley, DO

## 2022-04-30 ENCOUNTER — Encounter: Payer: Self-pay | Admitting: Internal Medicine

## 2022-05-01 ENCOUNTER — Ambulatory Visit: Payer: Medicare Other | Admitting: Family Medicine

## 2022-05-01 VITALS — BP 122/68 | HR 60 | Ht 62.0 in | Wt 110.0 lb

## 2022-05-01 DIAGNOSIS — M0579 Rheumatoid arthritis with rheumatoid factor of multiple sites without organ or systems involvement: Secondary | ICD-10-CM

## 2022-05-01 DIAGNOSIS — M4312 Spondylolisthesis, cervical region: Secondary | ICD-10-CM

## 2022-05-01 DIAGNOSIS — Z9889 Other specified postprocedural states: Secondary | ICD-10-CM | POA: Diagnosis not present

## 2022-05-01 DIAGNOSIS — M4722 Other spondylosis with radiculopathy, cervical region: Secondary | ICD-10-CM

## 2022-05-01 DIAGNOSIS — M4712 Other spondylosis with myelopathy, cervical region: Secondary | ICD-10-CM

## 2022-05-01 MED ORDER — GABAPENTIN 100 MG PO CAPS: 100.0000 mg | ORAL_CAPSULE | Freq: Every day | ORAL | 3 refills | Status: DC

## 2022-05-01 NOTE — Patient Instructions (Addendum)
Good to see you  Gabapentin '100mg'$  at night can take with other medications  Try and get a smaller cervical collar [facility] has one for 22 dollars  If you need me follow up in 6-8 weeks

## 2022-05-01 NOTE — Progress Notes (Signed)
Office Visit Note  Patient: Sherri [name]             Date of Birth: 1940/10/23           MRN: [id number]             PCP: Binnie Rail, MD Referring: Binnie Rail, MD Visit Date: 05/14/2022 Occupation: '@GUAROCC'$ @  Subjective:  Insomnia   History of Present Illness: Sherri [name] is a 81 y.o. female with history of seropositive rheumatoid and osteoporosis.  She is currently on [name] 15 mg 1 tablet by mouth daily, MTX 6 tabs weekly, folic acid 1 mg p.o, and prednisone 5 mg 1 tablet by mouth daily.  Patient reports that she restarted written Juluis Rainier and methotrexate about 2 weeks ago after undergoing a C-spine fusion on 04/23/2022.  Patient reports that she held both [name] and methotrexate for about 8 weeks around the time of surgery.  She states that overall her neck pain has improved but she continues to have some numbness in both hands residually from the surgery.  She has an upcoming appointment scheduled with her neurosurgeon for follow-up.  She continues to experience some aching and stiffness in both hands and both wrist joints but denies any joint swelling at this time.  She was recently started on Cymbalta and has been taking 20 mg daily as well as gabapentin 200 mg at bedtime.  She continues to have difficulty sleeping at night. She denies any recent or recurrent infections. Her last Prolia injection was administered on 01/09/2022.  Her next Prolia dose will be due in February 2024.  She continues to take a calcium vitamin D supplement daily.    Activities of Daily Living:  Patient reports morning stiffness for several hours.   Patient Reports nocturnal pain.  Difficulty dressing/grooming: Denies Difficulty climbing stairs: Denies Difficulty getting out of chair: Denies Difficulty using hands for taps, buttons, cutlery, and/or writing: Reports  Review of Systems  Constitutional:  Positive for fatigue.  HENT:  Positive for mouth dryness. Negative for mouth sores.    Eyes:  Positive for dryness.  Respiratory:  Negative for shortness of breath.   Cardiovascular:  Negative for chest pain and palpitations.  Gastrointestinal:  Positive for constipation. Negative for blood in stool and diarrhea.  Endocrine: Negative for increased urination.  Genitourinary:  Negative for involuntary urination.  Musculoskeletal:  Positive for joint pain, joint pain, joint swelling, myalgias, muscle weakness, morning stiffness, muscle tenderness and myalgias. Negative for gait problem.  Skin:  Negative for color change, rash, hair loss and sensitivity to sunlight.  Allergic/Immunologic: Negative for susceptible to infections.  Neurological:  Positive for dizziness and numbness. Negative for headaches.  Hematological:  Negative for swollen glands.  Psychiatric/Behavioral:  Positive for sleep disturbance. Negative for depressed mood. The patient is not nervous/anxious.     PMFS History:  Patient Active Problem List   Diagnosis Date Noted   Cervical spondylosis with myelopathy and radiculopathy 04/23/2022   Constipation 03/04/2022   Upper back pain on left side 03/04/2022   Lumbar vertebral fracture, pathologic 09/20/2021   Degenerative spondylolisthesis 08/19/2021   Acute left lumbar radiculopathy 07/05/2021   Closed fracture of rib of right side with delayed healing 05/29/2021   Senile purpura ([place]) 12/19/2020   Restless leg 02/02/2020   Bilateral leg edema 01/11/2020   Cervical radiculopathy at C8 06/28/2019   Carpal tunnel syndrome on both sides 06/28/2019   Right sided sciatica 04/14/2018   Trochanteric bursitis of right  hip 04/14/2018   H/O cervical spine surgery 11/10/2017   Dysphagia 10/26/2017   Gastroesophageal reflux disease 08/06/2017   Easy bruising 08/06/2017   Mid back pain 03/26/2017   Submandibular gland swelling 01/08/2017   High risk medication use 09/02/2016   Piriformis muscle pain 09/02/2016   Right hip pain 04/08/2016   Right knee pain  02/25/2016   Numbness and tingling in left hand 11/14/2014   Biceps tendon tear 10/10/2014   Insomnia 10/10/2014   Gait disturbance 06/13/2014   OP (osteoporosis) 01/18/2014   Other long term (current) drug therapy 01/18/2014   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 01/01/2013   Personal history of colonic polyps 07/03/2010   Nocturia 05/28/2010   THYROID NODULE 05/17/2008   Multiple sclerosis ([place]) 05/17/2008   Other fatigue 03/30/2007   Hyperlipidemia 08/20/2006   Essential hypertension 08/20/2006   Rheumatoid arthritis ([place]) 08/20/2006   CERVICAL CANCER, HX OF 08/20/2006   GASTRIC ULCER, HX OF 08/20/2006    Past Medical History:  Diagnosis Date   Anxiety    takes Valium daily as needed   Basal cell carcinoma 01/21/1988   left nostril (MOHS), sup-right calf (CX35FU)   Basal cell carcinoma 03/22/1996   right calf (CX35FU)   Basal cell carcinoma 03/31/1995   left wing nose   Bruises easily    d/t meds   Cancer (HCC)    basal cell ca, in situ- uterine    Cataracts, bilateral    removed bilateral   Chronic back pain    Dizziness    r/t to meds   GERD (gastroesophageal reflux disease)    no meds on a regular basis but will take Tums if needed   Headache(784.0)    r/t neck issues   History of bronchitis 6-32yr ago   History of colon polyps    benign   HOH (hard of hearing)    wears hearing aids   Hyperlipidemia    takes Atorvastatin on Mondays and Fridays   Hypertension    Joint pain    Joint swelling    Multiple sclerosis ([place]    Neuromuscular disorder ([place]    Dr. RPark Liter [facility], follows M.S.   Nocturia    PONV (postoperative nausea and vomiting)    trouble urinating after surgery in 2014   Postoperative nausea and vomiting 01/11/2019   Rheumatoid arthritis ([place]    Dr VJani Gravelweekly, RA- hands- knees- feet    Rheumatoid arthritis(714.0)    Dr DEstanislado Pandytakes XMorrie Sheldondaily   Right wrist fracture    Skin cancer    Squamous  cell carcinoma of skin 11/02/2001   in situ-right knee (cx373f   Squamous cell carcinoma of skin 11/12/2011   in situ-left forearm (CX35FU), in situ-left foot (CX35FU)   Squamous cell carcinoma of skin 01/22/2012   in situ-left lower forearm (txpbx)   Squamous cell carcinoma of skin 11/17/2012   right shin (txpbx)   Squamous cell carcinoma of skin 10/28/2013   in situ-right shoulder (CX35FU)   Squamous cell carcinoma of skin 04/28/2014   well diff-right shin (txpbx)   Squamous cell carcinoma of skin 11/02/2014   in situ-Left shin (txpbx)   Squamous cell carcinoma of skin 12/15/2014   in situ-Left hand (txpbx), bowens-left side chest (txpbx)   Squamous cell carcinoma of skin 05/09/2015   in situ-left hand (txpbx)    Squamous cell carcinoma of skin 05/07/2016   in situ-left inner shin,ant, in situ-left inner shin, post, in situ-left outer forearm,  in situ-right knuckle   Squamous cell carcinoma of skin 03/1302018   in situ-left shoulder (txpbx), in situ-right inner shin (txpbx), in situ-top of left foot (txpbx), KA- left forearm (txpbx)   Squamous cell carcinoma of skin 12/02/2016   in situ-left inner shin (txpbx)   Squamous cell carcinoma of skin 03/04/2017   in situ-left outer sup, shin (txpbx), in situ- right 2nd knuckle finger (txpbx), in situ- Left wrist (txpbx)   Squamous cell carcinoma of skin 04/06/2018   in situ-above left knee inner (txpbx)   Squamous cell carcinoma of skin 04/21/2018   in situ-right top hand (txpbx)   Squamous cell carcinoma of skin 07/08/2018   in situ-right lower inner shin (txpbx)   Squamous cell carcinoma of skin 04/27/2019   in situ- Left neck(CX35FU), In situ- right neck (CX35FU)   Urinary retention    sees Dr.Wrenn about 2 times a yr    Family History  Problem Relation Age of Onset   Cancer Mother        pancreatic   Diabetes Mother    Pancreatic cancer Mother    Heart disease Father        Rheumatic   Cancer Father        ? stomach    Stomach cancer Father    Cancer Sister        stomach   Stomach cancer Sister    Cancer Maternal Aunt        X 4; ? primary   Heart disease Paternal Aunt    Cancer Maternal Grandmother        cervical   Colon cancer Other        Aunts   Multiple sclerosis Daughter    Hypertension Neg Hx    Stroke Neg Hx    Esophageal cancer Neg Hx    Rectal cancer Neg Hx    Past Surgical History:  Procedure Laterality Date   ABDOMINAL HYSTERECTOMY     1985   ANTERIOR CERVICAL DECOMP/DISCECTOMY FUSION N/A 04/23/2022   Procedure: Anterior Cervical Decompression/Discectomy Fusion - Cervical Seven-Thoracic One;  Surgeon: Earnie Larsson, MD;  Location: [facility];  Service: Neurosurgery;  Laterality: N/A;  3C   APPENDECTOMY     with TAH   BACK SURGERY     several   BREAST BIOPSY Right    Results were negative   BROW LIFT Bilateral 10/02/2016   Procedure: BILATERAL LOWER LID BLEPHAROPLASTY;  Surgeon: Clista Bernhardt, MD;  Location: [facility];  Service: Plastics;  Laterality: Bilateral;   CARPAL TUNNEL RELEASE Right    cataracts     CERVICAL FUSION     Dr Joya Salm   CERVICAL FUSION  12/31/2012   Dr Joya Salm   CHEST TUBE INSERTION     for traumatic Pneumothorax   COLONOSCOPY  07/16/2010   normal    COLONOSCOPY W/ POLYPECTOMY  1997   negative since; Dr Deatra Ina   ESOPHAGOGASTRODUODENOSCOPY  07/16/2010   normal   eye lid raise     EYE SURGERY Bilateral    cataracts removed - /w IOL   HARDWARE REMOVAL  08/2021   JOINT REPLACEMENT     LUMBAR FUSION     Dr Joya Salm   NASAL SINUS SURGERY     POSTERIOR CERVICAL FUSION/FORAMINOTOMY N/A 12/31/2012   Procedure: POSTERIOR LATERAL CERVICAL FUSION/FORAMINOTOMY LEVEL 1 CERVICAL THREE-FOUR WITH LATERAL MASS SCREWS;  Surgeon: Floyce Stakes, MD;  Location: [facility];  Service: Neurosurgery;  Laterality: N/A;   PTOSIS REPAIR Bilateral  10/02/2016   Procedure: INTERNAL PTOSIS REPAIR;  Surgeon: Clista Bernhardt, MD;  Location: [facility];  Service: Plastics;   Laterality: Bilateral;   SKIN BIOPSY     THYROID SURGERY     R lobe removed- 1972, has grown back - CT last done- 2018   TOTAL HIP ARTHROPLASTY  12/22/2011   Procedure: TOTAL HIP ARTHROPLASTY;  Surgeon: Kerin Salen, MD;  Location: [facility];  Service: Orthopedics;  Laterality: Left;   TOTAL SHOULDER ARTHROPLASTY     TUBAL LIGATION     UPPER GASTROINTESTINAL ENDOSCOPY  2012   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   Social History   Social History Narrative   Not on file   Immunization History  Administered Date(s) Administered   Fluad Quad(high Dose 65+) 02/02/2020, 02/01/2022   Influenza Whole 02/22/2007, 02/08/2013   Influenza, High Dose Seasonal PF 02/04/2015, 02/18/2017, 02/21/2018, 01/16/2019, 01/25/2021   Influenza-Unspecified 01/24/2014, 01/25/2016, 02/18/2017, 01/16/2019, 01/25/2020   Moderna Covid-19 Vaccine Bivalent Booster 63yr & up 02/11/2022   PFIZER(Purple Top)SARS-COV-2 Vaccination 06/18/2019, 07/09/2019, 01/09/2020, 07/03/2020, 02/07/2021   PNEUMOCOCCAL CONJUGATE-20 02/26/2022   Pfizer Covid-19 Vaccine Bivalent Booster 163yr& up 02/08/2021   Pneumococcal Conjugate-13 11/23/2012, 09/08/2014   Pneumococcal Polysaccharide-23 05/26/2006   Pneumococcal-Unspecified 12/28/2012, 06/09/2020   Respiratory Syncytial Virus Vaccine,Recomb Aduvanted(Arexvy) 01/23/2022   Td 05/28/2010   Tdap 12/24/2020, 01/07/2021   Unspecified SARS-COV-2 Vaccination 06/26/2020, 02/11/2022   Varicella 11/07/2020   Zoster Recombinat (Shingrix) 02/10/2019, 04/14/2019   Zoster, Live 05/26/2009, 07/10/2020     Objective: Vital Signs: BP 118/77 (BP Location: Left Arm, Patient Position: Sitting, Cuff Size: Normal)   Pulse 68   Resp 13   Ht '5\' 1"'$  (1.549 m)   Wt 110 lb 9.6 oz (50.2 kg)   BMI 20.90 kg/m    Physical Exam Vitals and nursing note reviewed.  Constitutional:      Appearance: She is well-developed.  HENT:     Head: Normocephalic and atraumatic.  Eyes:     Conjunctiva/sclera:  Conjunctivae normal.  Cardiovascular:     Rate and Rhythm: Normal rate and regular rhythm.     Heart sounds: Normal heart sounds.  Pulmonary:     Effort: Pulmonary effort is normal.     Breath sounds: Normal breath sounds.  Abdominal:     General: Bowel sounds are normal.     Palpations: Abdomen is soft.  Musculoskeletal:     Cervical back: Normal range of motion.  Skin:    General: Skin is warm and dry.     Capillary Refill: Capillary refill takes less than 2 seconds.  Neurological:     Mental Status: She is alert and oriented to person, place, and time.  Psychiatric:        Behavior: Behavior normal.      Musculoskeletal Exam: C-spine has limited range of motion. Shoulder joints, elbow joints, wrist joints have good range of motion with no synovitis.  Synovial thickening over MCP joints but no tenderness or synovitis.  Mild inflammation and tenderness over the right fifth PIP and left fourth PIP joint.  PIP and DIP thickening consistent with osteoarthritis of both hands.  Subluxation of several PIP and DIP joints.  Hip joints difficult to assess in seated position.  Knee joints have good range of motion with no warmth or effusion.  Ankle joints have good range of motion with no tenderness or synovitis.  CDAI Exam: CDAI Score: 2.8  Patient Global: 4 mm; Provider Global: 4 mm Swollen: 2 ;  Tender: 1  Joint Exam 05/14/2022      Right  Left  PIP 4     Swollen   PIP 5  Swollen      Cervical Spine   Tender        Investigation: No additional findings.  Imaging: DG Cervical Spine 1 View  Result Date: 04/23/2022 CLINICAL DATA:  ACDF C7-T1 EXAM: DG CERVICAL SPINE - 1 VIEW COMPARISON:  12/26/2021 FINDINGS: Remote changes of posterior fusion at C3-4 and anterior fusion C4 to C7. Removal of the anterior plate at C7 with placement of new plate from Z6-X0. The T1 level is difficult to visualize due to overlying shoulders. No visible complicating feature. IMPRESSION: Postoperative  changes as above.  No visible complicating feature. Electronically Signed   By: Rolm Baptise M.D.   On: 04/23/2022 10:07   DG C-Arm 1-60 Min-No Report  Result Date: 04/23/2022 Fluoroscopy was utilized by the requesting physician.  No radiographic interpretation.    Recent Labs: Lab Results  Component Value Date   WBC 5.7 04/16/2022   HGB 12.5 04/16/2022   PLT 235 04/16/2022   NA 138 04/16/2022   K 4.5 04/16/2022   CL 102 04/16/2022   CO2 28 04/16/2022   GLUCOSE 110 (H) 04/16/2022   BUN 11 04/16/2022   CREATININE 0.94 04/16/2022   BILITOT 0.5 03/05/2022   ALKPHOS 29 (L) 04/10/2020   AST 27 03/05/2022   ALT 16 03/05/2022   PROT 6.3 03/05/2022   ALBUMIN 4.2 04/10/2020   CALCIUM 9.1 04/16/2022   GFRAA 65 09/26/2020   QFTBGOLDPLUS NEGATIVE 12/12/2021    Speciality Comments: Prior therapy includes: [name] (cost), Olumiant-inadequate response, Imuran weakness, leflunomide-intolerance Prolia: 05/04/18, 11/12/18, 06/09/19, 12/07/19, 07/05/20  Procedures:  No procedures performed Allergies: Darvon [propoxyphene hcl], Demerol [meperidine], Penicillins, Imuran [azathioprine], Sulfa antibiotics, Arava [leflunomide], Lipitor [atorvastatin], and Vytorin [ezetimibe-simvastatin]     Assessment / Plan:     Visit Diagnoses: Rheumatoid arthritis involving multiple sites with positive rheumatoid factor ([place]): Mild inflammation noted over the right fifth PIP and left fourth PIP joint.  No tenderness or synovitis over MCP joints.  Both wrist joints have good range of motion with no tenderness or synovitis.  She continues to experience intermittent pain and stiffness in both hands and both wrist joints intermittently but has not noticed any increased active inflammation.  She is currently taking [name] 15 mg 1 tablet by mouth daily, methotrexate 6 tablets by mouth once weekly, and folic acid 1 mg daily.  She remains on prednisone 5 mg 1 tablet daily.  She held [name] and methotrexate for about 8 weeks  around the time of undergoing a C7-T1 anterior cervical discectomy and fusion on 04/23/2022.  She has been back on therapy for about 2 weeks.  Her symptoms are gradually improving.  She will remain on combination therapy as prescribed.  She was advised to notify us if she develops any signs or symptoms of a flare.  She will follow-up in the office in 3 months or sooner if needed.  High risk medication use - [name] 15 mg 1 tablet by mouth daily, MTX 6 tabs weekly, folic acid 1 mg p.o, prednisone 5 mg 1 tablet by mouth daily.  CBC and BMP updated on 04/16/22.  Orders for CBC and CMP were released today.  Her next lab work will be due in March and every 3 months to monitor for drug toxicity. TB gold negative on 12/12/21.  No recent or recurrent infections.  Discussed the importance  of holding rinvoq and MTX if she develops signs or symptoms of an infection and to resume once the infection has completely cleared.   - Plan: COMPLETE METABOLIC PANEL WITH GFR, CBC with Differential/Platelet  Closed fracture of right wrist with routine healing, subsequent encounter: She experiences intermittent soreness and aching in the right wrist.  On examination she has good range of motion with no tenderness or synovitis.  Primary osteoarthritis of right hip: She is not experiencing any increased discomfort in her hip at this time.  History of total hip replacement, left - Dr. Mayer Camel.  She continues to experience intermittent discomfort in the left hip replacement.  No groin pain currently.  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: She underwent an uncomplicated C9-S4 anterior cervical discectomy and fusion on 04/23/2022 performed by Dr. Annette Stable.  She had [name] and methotrexate for about 8 weeks around the time of surgery and resumed therapy about 2 weeks ago.  She is scheduled for an upcoming visit in the next 1 to 2 weeks.  Pain in thoracic spine: She is not experiencing any thoracic pain at this time.  DDD  (degenerative disc disease), lumbar - Lumbar spine surgery in January 2023 x2 by Dr. Annette Stable.  Age-related osteoporosis without current pathological fracture - DEXA: 01/16/2020 T-score: -4.2. Hx of frequent fractures.  Previous Prolia doses: 05/04/18, 11/12/18, 06/09/19, 12/07/19, 07/05/20, 01/01/2021, 07/09/2021, 01/09/2022.  Next Prolia will be due in February 2024.  She continues to take a calcium and vitamin D supplement daily.  No recent falls. Due to update DEXA at the [facility] center.  Vitamin D deficiency: She is taking calcium and vitamin D supplement daily.  Other fatigue: Chronic and secondary to insomnia.  Primary insomnia - She is taking gabapentin 200 mg at bedtime.  Discussed the importance of good sleep hygiene.  Other medical conditions are listed as follows:  History of hypertension: Blood pressure was 118/77 today in the office.  History of anxiety  History of multiple sclerosis ([place]) - She is not a good candidate for TNF inhibitors.  History of hyperlipidemia  History of depression  Orders: Orders Placed This Encounter  Procedures   COMPLETE METABOLIC PANEL WITH GFR   CBC with Differential/Platelet   No orders of the defined types were placed in this encounter.    Follow-Up Instructions: Return in about 3 months (around 08/13/2022) for Rheumatoid arthritis, Osteoporosis.   Ofilia Neas, PA-C  Note - This record has been created using Dragon software.  Chart creation errors have been sought, but may not always  have been located. Such creation errors do not reflect on  the standard of medical care.

## 2022-05-02 NOTE — Assessment & Plan Note (Signed)
Patient had fusion all the way to C7 and T1.  Total time discussing with patient about different treatment options 31 minutes

## 2022-05-02 NOTE — Assessment & Plan Note (Signed)
Patient did undergo the C7-T1 fusion.  Having more discomfort in elbow secondary to the soft collar that patient is wearing.  Does appear to be somewhat enlarged for this individual.  We discussed though that patient should continue to follow the recommendations of neurosurgery.  Patient is going to be seeing the neurosurgeon in the next 8 days.  Discussed with her that she should continue it.  Patient would like more comfort at night.  We did bring up the possibility of a smaller soft collar potentially for this but would have her ask the neurosurgeon.  Follow-up with me again in 2 to 3 months or as needed.

## 2022-05-08 ENCOUNTER — Other Ambulatory Visit: Payer: Self-pay | Admitting: Family Medicine

## 2022-05-09 ENCOUNTER — Encounter: Payer: Self-pay | Admitting: Family Medicine

## 2022-05-14 ENCOUNTER — Encounter: Payer: Self-pay | Admitting: Physician Assistant

## 2022-05-14 ENCOUNTER — Ambulatory Visit: Payer: Medicare Other | Attending: Physician Assistant | Admitting: Physician Assistant

## 2022-05-14 VITALS — BP 118/77 | HR 68 | Resp 13 | Ht 61.0 in | Wt 110.6 lb

## 2022-05-14 DIAGNOSIS — Z79899 Other long term (current) drug therapy: Secondary | ICD-10-CM | POA: Diagnosis not present

## 2022-05-14 DIAGNOSIS — R5383 Other fatigue: Secondary | ICD-10-CM

## 2022-05-14 DIAGNOSIS — F5101 Primary insomnia: Secondary | ICD-10-CM | POA: Diagnosis not present

## 2022-05-14 DIAGNOSIS — G35 Multiple sclerosis: Secondary | ICD-10-CM

## 2022-05-14 DIAGNOSIS — M4312 Spondylolisthesis, cervical region: Secondary | ICD-10-CM

## 2022-05-14 DIAGNOSIS — S62101D Fracture of unspecified carpal bone, right wrist, subsequent encounter for fracture with routine healing: Secondary | ICD-10-CM

## 2022-05-14 DIAGNOSIS — M1611 Unilateral primary osteoarthritis, right hip: Secondary | ICD-10-CM

## 2022-05-14 DIAGNOSIS — E559 Vitamin D deficiency, unspecified: Secondary | ICD-10-CM | POA: Diagnosis not present

## 2022-05-14 DIAGNOSIS — M51369 Other intervertebral disc degeneration, lumbar region without mention of lumbar back pain or lower extremity pain: Secondary | ICD-10-CM

## 2022-05-14 DIAGNOSIS — Z8679 Personal history of other diseases of the circulatory system: Secondary | ICD-10-CM

## 2022-05-14 DIAGNOSIS — Z8639 Personal history of other endocrine, nutritional and metabolic disease: Secondary | ICD-10-CM

## 2022-05-14 DIAGNOSIS — Z8659 Personal history of other mental and behavioral disorders: Secondary | ICD-10-CM

## 2022-05-14 DIAGNOSIS — M5136 Other intervertebral disc degeneration, lumbar region: Secondary | ICD-10-CM

## 2022-05-14 DIAGNOSIS — M810 Age-related osteoporosis without current pathological fracture: Secondary | ICD-10-CM | POA: Diagnosis not present

## 2022-05-14 DIAGNOSIS — Z96642 Presence of left artificial hip joint: Secondary | ICD-10-CM | POA: Diagnosis not present

## 2022-05-14 DIAGNOSIS — M0579 Rheumatoid arthritis with rheumatoid factor of multiple sites without organ or systems involvement: Secondary | ICD-10-CM | POA: Diagnosis not present

## 2022-05-14 DIAGNOSIS — M546 Pain in thoracic spine: Secondary | ICD-10-CM

## 2022-05-14 NOTE — Patient Instructions (Addendum)
Standing Labs We placed an order today for your standing lab work.   Please have your standing labs drawn in March and every 3 months   Please have your labs drawn 2 weeks prior to your appointment so that the provider can discuss your lab results at your appointment.  Please note that you may see your imaging and lab results in MyChart before we have reviewed them. We will contact you once all results are reviewed. Please allow our office up to 72 hours to thoroughly review all of the results before contacting the office for clarification of your results.  Lab hours are:   Monday through Thursday from 8:00 am -12:30 pm and 1:00 pm-5:00 pm and Friday from 8:00 am-12:00 pm.  Please be advised, all patients with office appointments requiring lab work will take precedent over walk-in lab work.   Labs are drawn by [facility]. Please bring your co-pay at the time of your lab draw.  You may receive a bill from [facility] for your lab work.  Please note if you are on Hydroxychloroquine and and an order has been placed for a Hydroxychloroquine level, you will need to have it drawn 4 hours or more after your last dose.  If you wish to have your labs drawn at another location, please call the office 24 hours in advance so we can fax the orders.  The office is located at [address], [address], [place], [place] [address] No appointment is necessary.    If you have any questions regarding directions or hours of operation,  please call [phone].   As a reminder, please drink plenty of water prior to coming for your lab work. Thanks!  If you have signs or symptoms of an infection or start antibiotics: First, call your PCP for workup of your infection. Hold your medication through the infection, until you complete your antibiotics, and until symptoms resolve if you take the following: Injectable medication (Actemra, Benlysta, Cimzia, Cosentyx, Enbrel, Humira, Kevzara, Orencia, Remicade, Simponi,  Stelara, Taltz, Tremfya) Methotrexate Leflunomide (Arava) Mycophenolate (Cellcept) Xeljanz, Olumiant, or [name]   Vaccines You are taking a medication(s) that can suppress your immune system.  The following immunizations are recommended: Flu annually Covid-19  Td/Tdap (tetanus, diphtheria, pertussis) every 10 years Pneumonia (Prevnar 15 then Pneumovax 23 at least 1 year apart.  Alternatively, can take Prevnar 20 without needing additional dose) Shingrix: 2 doses from 4 weeks to 6 months apart  Please check with your PCP to make sure you are up to date.   

## 2022-05-15 LAB — CBC WITH DIFFERENTIAL/PLATELET
Absolute Monocytes: 441 cells/uL (ref 200–950)
Basophils Absolute: 18 cells/uL (ref 0–200)
Basophils Relative: 0.4 %
Eosinophils Absolute: 9 cells/uL — ABNORMAL LOW (ref 15–500)
Eosinophils Relative: 0.2 %
HCT: 35 % (ref 35.0–45.0)
Hemoglobin: 12.4 g/dL (ref 11.7–15.5)
Lymphs Abs: 1746 cells/uL (ref 850–3900)
MCH: 35.7 pg — ABNORMAL HIGH (ref 27.0–33.0)
MCHC: 35.4 g/dL (ref 32.0–36.0)
MCV: 100.9 fL — ABNORMAL HIGH (ref 80.0–100.0)
MPV: 9.6 fL (ref 7.5–12.5)
Monocytes Relative: 9.8 %
Neutro Abs: 2286 cells/uL (ref 1500–7800)
Neutrophils Relative %: 50.8 %
Platelets: 323 10*3/uL (ref 140–400)
RBC: 3.47 10*6/uL — ABNORMAL LOW (ref 3.80–5.10)
RDW: 12.5 % (ref 11.0–15.0)
Total Lymphocyte: 38.8 %
WBC: 4.5 10*3/uL (ref 3.8–10.8)

## 2022-05-15 LAB — COMPLETE METABOLIC PANEL WITH GFR
AG Ratio: 1.5 (calc) (ref 1.0–2.5)
ALT: 18 U/L (ref 6–29)
AST: 28 U/L (ref 10–35)
Albumin: 3.9 g/dL (ref 3.6–5.1)
Alkaline phosphatase (APISO): 53 U/L (ref 37–153)
BUN: 12 mg/dL (ref 7–25)
CO2: 30 mmol/L (ref 20–32)
Calcium: 9.4 mg/dL (ref 8.6–10.4)
Chloride: 98 mmol/L (ref 98–110)
Creat: 0.89 mg/dL (ref 0.60–0.95)
Globulin: 2.6 g/dL (calc) (ref 1.9–3.7)
Glucose, Bld: 97 mg/dL (ref 65–99)
Potassium: 4.8 mmol/L (ref 3.5–5.3)
Sodium: 136 mmol/L (ref 135–146)
Total Bilirubin: 0.4 mg/dL (ref 0.2–1.2)
Total Protein: 6.5 g/dL (ref 6.1–8.1)
eGFR: 65 mL/min/{1.73_m2} (ref >=60)

## 2022-05-15 NOTE — Progress Notes (Signed)
CBC stable. CMP WNL.

## 2022-05-16 ENCOUNTER — Telehealth: Payer: Self-pay | Admitting: Family Medicine

## 2022-05-16 ENCOUNTER — Other Ambulatory Visit: Payer: Self-pay

## 2022-05-16 DIAGNOSIS — M5412 Radiculopathy, cervical region: Secondary | ICD-10-CM

## 2022-05-16 MED ORDER — GABAPENTIN 100 MG PO CAPS: 200.0000 mg | ORAL_CAPSULE | Freq: Every day | ORAL | 3 refills | Status: DC

## 2022-05-16 NOTE — Telephone Encounter (Signed)
Pt informed of refill via phone.

## 2022-05-16 NOTE — Telephone Encounter (Signed)
Sherri [name] pt only has 1 more dose of Gabapentin. Per pt dosage was increased to two nightly but prescription is still written for 1.This caused her to run short quickly.  In Dr. Thompson Caul absence, can you refill pt Gabapentin?

## 2022-05-25 ENCOUNTER — Other Ambulatory Visit: Payer: Self-pay | Admitting: Physician Assistant

## 2022-05-27 ENCOUNTER — Telehealth: Payer: Self-pay | Admitting: Pharmacist

## 2022-05-27 NOTE — Telephone Encounter (Signed)
Submitted a Prior Authorization request to [facility] for [facility] via CoverMyMeds. Will update once we receive a response.  Key: [id number]  Per automated response: This medication or product was previously approved on [id number] from 2022-05-26 to 2023-05-26  Knox Saliva, PharmD, MPH, BCPS, CPP Clinical Pharmacist (Rheumatology and Pulmonology)

## 2022-05-27 NOTE — Telephone Encounter (Signed)
Next Visit: 07/31/2022  Last Visit: 05/14/2022  Last Fill: 03/04/2022  DX: Rheumatoid arthritis involving multiple sites with positive rheumatoid factor   Current Dose per office note 05/14/2022: MTX 6 tabs weekly   Labs: 05/14/2022 CBC stable. CMP WNL.   Okay to refill MTX?

## 2022-05-29 ENCOUNTER — Encounter: Payer: Self-pay | Admitting: Family Medicine

## 2022-05-29 DIAGNOSIS — M5412 Radiculopathy, cervical region: Secondary | ICD-10-CM

## 2022-05-30 MED ORDER — GABAPENTIN 100 MG PO CAPS: 200.0000 mg | ORAL_CAPSULE | Freq: Three times a day (TID) | ORAL | 3 refills | Status: DC

## 2022-06-04 ENCOUNTER — Telehealth: Payer: Self-pay | Admitting: *Deleted

## 2022-06-04 NOTE — Telephone Encounter (Signed)
Medication Samples have been provided to the patient.  Drug name: [name]       Strength: 15 mg        Qty: 2  LOT: [id number]  Exp.Date: 10/12/2023  Dosing instructions: Take one tablet by mouth daily.

## 2022-06-05 DIAGNOSIS — M4802 Spinal stenosis, cervical region: Secondary | ICD-10-CM | POA: Diagnosis not present

## 2022-06-09 DIAGNOSIS — R311 Benign essential microscopic hematuria: Secondary | ICD-10-CM | POA: Diagnosis not present

## 2022-06-09 DIAGNOSIS — N3010 Interstitial cystitis (chronic) without hematuria: Secondary | ICD-10-CM | POA: Diagnosis not present

## 2022-06-10 ENCOUNTER — Telehealth: Payer: Self-pay | Admitting: Internal Medicine

## 2022-06-10 MED ORDER — CYCLOBENZAPRINE HCL 10 MG PO TABS: 10.0000 mg | ORAL_TABLET | Freq: Three times a day (TID) | ORAL | 2 refills | Status: DC | PRN

## 2022-06-10 NOTE — Telephone Encounter (Signed)
Patient states that Dr. Annette Stable is getting ready to release her following her surgery and patient is requesting that Dr. Quay Burow refill her cyclobenzaprine (FLEXERIL) 10 MG tablet to [id number] [place]  Patient's call back number is [phone] Last OV 03/04/22

## 2022-06-16 ENCOUNTER — Other Ambulatory Visit (HOSPITAL_COMMUNITY): Payer: Self-pay

## 2022-06-18 ENCOUNTER — Other Ambulatory Visit (HOSPITAL_COMMUNITY): Payer: Self-pay

## 2022-06-18 NOTE — Progress Notes (Signed)
[facility] [address] [place] [address] Phone: [phone] Subjective:   INadine Counts, am serving as a scribe for Dr. Antoine Primas.  I'm seeing this patient by the request  of:  Pincus Sanes, MD  CC: Neck and low back pain follow-up  [id number]  05/01/2022 Patient did undergo the C7-T1 fusion.  Having more discomfort in elbow secondary to the soft collar that patient is wearing.  Does appear to be somewhat enlarged for this individual.  We discussed though that patient should continue to follow the recommendations of neurosurgery.  Patient is going to be seeing the neurosurgeon in the next 8 days.  Discussed with her that she should continue it.  Patient would like more comfort at night.  We did bring up the possibility of a smaller soft collar potentially for this but would have her ask the neurosurgeon.  Follow-up with me again in 2 to 3 months or as needed.     Patient had fusion all the way to C7 and T1. Total time discussing with patient about different treatment options 31 minutes   Updated 06/19/2022 [place] A Keltner is a 82 y.o. female coming in with complaint of neck pain.  Patient acutely did have the fusion and C7-T1.  Patient is having more discomfort and pain secondary to more of the soft collar and immobility of the neck.  Patient was to increase the gabapentin to 300 mg at night and seem to be helping her with some of the discomfort.  Talk about gabapentin dosage and usage. Wants to increase to 4 times a day Patient was given the Cymbalta.      Past Medical History:  Diagnosis Date   Anxiety    takes Valium daily as needed   Basal cell carcinoma 01/21/1988   left nostril (MOHS), sup-right calf (CX35FU)   Basal cell carcinoma 03/22/1996   right calf (CX35FU)   Basal cell carcinoma 03/31/1995   left wing nose   Bruises easily    d/t meds   Cancer (HCC)    basal cell ca, in situ- uterine    Cataracts, bilateral     removed bilateral   Chronic back pain    Dizziness    r/t to meds   GERD (gastroesophageal reflux disease)    no meds on a regular basis but will take Tums if needed   Headache(784.0)    r/t neck issues   History of bronchitis 6-20yrs ago   History of colon polyps    benign   HOH (hard of hearing)    wears hearing aids   Hyperlipidemia    takes Atorvastatin on Mondays and Fridays   Hypertension    Joint pain    Joint swelling    Multiple sclerosis (HCC)    Neuromuscular disorder (HCC)    Dr. Worthington Nation- [facility], follows [facility]   Nocturia    PONV (postoperative nausea and vomiting)    trouble urinating after surgery in 2014   Postoperative nausea and vomiting 01/11/2019   Rheumatoid arthritis (HCC)    Dr Glo Herring weekly, RA- hands- knees- feet    Rheumatoid arthritis(714.0)    Dr Corliss Skains takes Harriette Ohara daily   Right wrist fracture    Skin cancer    Squamous cell carcinoma of skin 11/02/2001   in situ-right knee (cx29fu)   Squamous cell carcinoma of skin 11/12/2011   in situ-left forearm (CX35FU), in situ-left foot (CX35FU)   Squamous cell carcinoma of  skin 01/22/2012   in situ-left lower forearm (txpbx)   Squamous cell carcinoma of skin 11/17/2012   right shin (txpbx)   Squamous cell carcinoma of skin 10/28/2013   in situ-right shoulder (CX35FU)   Squamous cell carcinoma of skin 04/28/2014   well diff-right shin (txpbx)   Squamous cell carcinoma of skin 11/02/2014   in situ-Left shin (txpbx)   Squamous cell carcinoma of skin 12/15/2014   in situ-Left hand (txpbx), bowens-left side chest (txpbx)   Squamous cell carcinoma of skin 05/09/2015   in situ-left hand (txpbx)    Squamous cell carcinoma of skin 05/07/2016   in situ-left inner shin,ant, in situ-left inner shin, post, in situ-left outer forearm, in situ-right knuckle   Squamous cell carcinoma of skin 03/1302018   in situ-left shoulder (txpbx), in situ-right inner shin (txpbx), in situ-top of  left foot (txpbx), KA- left forearm (txpbx)   Squamous cell carcinoma of skin 12/02/2016   in situ-left inner shin (txpbx)   Squamous cell carcinoma of skin 03/04/2017   in situ-left outer sup, shin (txpbx), in situ- right 2nd knuckle finger (txpbx), in situ- Left wrist (txpbx)   Squamous cell carcinoma of skin 04/06/2018   in situ-above left knee inner (txpbx)   Squamous cell carcinoma of skin 04/21/2018   in situ-right top hand (txpbx)   Squamous cell carcinoma of skin 07/08/2018   in situ-right lower inner shin (txpbx)   Squamous cell carcinoma of skin 04/27/2019   in situ- Left neck(CX35FU), In situ- right neck (CX35FU)   Urinary retention    sees Dr.Wrenn about 2 times a yr   Past Surgical History:  Procedure Laterality Date   ABDOMINAL HYSTERECTOMY     1985   ANTERIOR CERVICAL DECOMP/DISCECTOMY FUSION N/A 04/23/2022   Procedure: Anterior Cervical Decompression/Discectomy Fusion - Cervical Seven-Thoracic One;  Surgeon: Julio Sicks, MD;  Location: [facility];  Service: Neurosurgery;  Laterality: N/A;  3C   APPENDECTOMY     with TAH   BACK SURGERY     several   BREAST BIOPSY Right    Results were negative   BROW LIFT Bilateral 10/02/2016   Procedure: BILATERAL LOWER LID BLEPHAROPLASTY;  Surgeon: Floydene Flock, MD;  Location: [facility] OR;  Service: Plastics;  Laterality: Bilateral;   CARPAL TUNNEL RELEASE Right    cataracts     CERVICAL FUSION     Dr Jeral Fruit   CERVICAL FUSION  12/31/2012   Dr Jeral Fruit   CHEST TUBE INSERTION     for traumatic Pneumothorax   COLONOSCOPY  07/16/2010   normal    COLONOSCOPY W/ POLYPECTOMY  1997   negative since; Dr Arlyce Dice   ESOPHAGOGASTRODUODENOSCOPY  07/16/2010   normal   eye lid raise     EYE SURGERY Bilateral    cataracts removed - /w IOL   HARDWARE REMOVAL  08/2021   JOINT REPLACEMENT     LUMBAR FUSION     Dr Jeral Fruit   NASAL SINUS SURGERY     POSTERIOR CERVICAL FUSION/FORAMINOTOMY N/A 12/31/2012   Procedure: POSTERIOR LATERAL CERVICAL  FUSION/FORAMINOTOMY LEVEL 1 CERVICAL THREE-FOUR WITH LATERAL MASS SCREWS;  Surgeon: Karn Cassis, MD;  Location: [facility];  Service: Neurosurgery;  Laterality: N/A;   PTOSIS REPAIR Bilateral 10/02/2016   Procedure: INTERNAL PTOSIS REPAIR;  Surgeon: Floydene Flock, MD;  Location: [facility];  Service: Plastics;  Laterality: Bilateral;   SKIN BIOPSY     THYROID SURGERY     R lobe removed- 1972, has grown back -  CT last done- 2018   TOTAL HIP ARTHROPLASTY  12/22/2011   Procedure: TOTAL HIP ARTHROPLASTY;  Surgeon: Nestor Lewandowsky, MD;  Location: [facility];  Service: Orthopedics;  Laterality: Left;   TOTAL SHOULDER ARTHROPLASTY     TUBAL LIGATION     UPPER GASTROINTESTINAL ENDOSCOPY  2012   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   Social History   Socioeconomic History   Marital status: Married    Spouse name: Amada Jupiter   Number of children: 2   Years of education: Not on file   Highest education level: Not on file  Occupational History   Occupation: retired  Tobacco Use   Smoking status: Former    Packs/day: 2.50    Years: 20.00    Total pack years: 50.00    Types: Cigarettes    Quit date: 05/26/1981    Years since quitting: 41.0    Passive exposure: Never   Smokeless tobacco: Never   Tobacco comments:    Smoked [phone], up to 2.5 ppd  Vaping Use   Vaping Use: Never used  Substance and Sexual Activity   Alcohol use: Yes    Alcohol/week: 14.0 standard drinks of alcohol    Types: 14 Cans of beer per week    Comment: 2% beer   Drug use: Never   Sexual activity: Yes    Birth control/protection: Surgical    Comment: Hysterectomy  Other Topics Concern   Not on file  Social History Narrative   Not on file   Social Determinants of Health   Financial Resource Strain: Low Risk  (02/21/2022)   Overall Financial Resource Strain (CARDIA)    Difficulty of Paying Living Expenses: Not hard at all  Food Insecurity: No Food Insecurity (04/25/2022)   Hunger Vital Sign    Worried About  Running Out of Food in the Last Year: Never true    Ran Out of Food in the Last Year: Never true  Transportation Needs: No Transportation Needs (04/25/2022)   PRAPARE - Administrator, Civil Service (Medical): No    Lack of Transportation (Non-Medical): No  Physical Activity: Inactive (02/21/2022)   Exercise Vital Sign    Days of Exercise per Week: 0 days    Minutes of Exercise per Session: 0 min  Stress: Stress Concern Present (02/21/2022)   [employer] of Occupational Health - Occupational Stress Questionnaire    Feeling of Stress : Rather much  Social Connections: Moderately Isolated (02/21/2022)   Social Connection and Isolation Panel [NHANES]    Frequency of Communication with Friends and Family: More than three times a week    Frequency of Social Gatherings with Friends and Family: Twice a week    Attends Religious Services: Never    Database administrator or Organizations: No    Attends Banker Meetings: Never    Marital Status: Married   Allergies  Allergen Reactions   Darvon [Propoxyphene Hcl] Shortness Of Breath    ? Dose Related ? Lowered respirations greatly   Demerol [Meperidine] Shortness Of Breath and Other (See Comments)    Respiratory Distress   Penicillins Hives and Other (See Comments)    Has patient had a PCN reaction causing immediate rash, facial/tongue/throat swelling, SOB or lightheadedness with hypotension: No SEVERE RASH INVOLVING MUCUS MEMBRANES or SKIN NECROSIS: #  #  #  YES  #  #  #  Has patient had a PCN reaction that required hospitalization No Has patient had a PCN  reaction occurring within the last 10 years: No.    Imuran [Azathioprine] Other (See Comments)    Weakness   Sulfa Antibiotics Nausea Only and Other (See Comments)    States had severe indigestion and heartburn and nausea and had to stop taking   Arava [Leflunomide] Other (See Comments)    Excessive weight gain   Lipitor [Atorvastatin] Nausea And Vomiting     Currently taking 2 times weekly - able to tolerate low dose    Vytorin [Ezetimibe-Simvastatin] Nausea And Vomiting   Family History  Problem Relation Age of Onset   Cancer Mother        pancreatic   Diabetes Mother    Pancreatic cancer Mother    Heart disease Father        Rheumatic   Cancer Father        ? stomach   Stomach cancer Father    Cancer Sister        stomach   Stomach cancer Sister    Cancer Maternal Aunt        X 4; ? primary   Heart disease Paternal Aunt    Cancer Maternal Grandmother        cervical   Colon cancer Other        Aunts   Multiple sclerosis Daughter    Hypertension Neg Hx    Stroke Neg Hx    Esophageal cancer Neg Hx    Rectal cancer Neg Hx     Current Outpatient Medications (Endocrine & Metabolic):    denosumab (PROLIA) 60 MG/ML SOSY injection, Inject 60 mg into the skin every 6 (six) months. Deliver to rheum: [address], [place], [place] [address]. Appt on 01/09/22   predniSONE (DELTASONE) 5 MG tablet, 1 tablet by mouth every morning  Current Outpatient Medications (Cardiovascular):    atorvastatin (LIPITOR) 10 MG tablet, TAKE 1 TAB ON MONDAYS AND FRIDAY   carvedilol (COREG) 6.25 MG tablet, TAKE 1 TABLET BY MOUTH TWICE A DAY WITH MEALS   furosemide (LASIX) 20 MG tablet, TAKE 1 TABLET BY MOUTH EVERY DAY   Current Outpatient Medications (Analgesics):    HYDROcodone-acetaminophen (NORCO/VICODIN) 5-325 MG tablet, Take 1 tablet by mouth every 4 (four) hours as needed for moderate pain ((score 4 to 6)). (Patient not taking: Reported on 05/14/2022)   traMADol (ULTRAM) 50 MG tablet, TAKE 1 TABLET (50 MG TOTAL) BY MOUTH 3 (THREE) TIMES DAILY AS NEEDED FOR SEVERE PAIN. (Patient not taking: Reported on 05/14/2022)   Upadacitinib ER ([name]) 15 MG TB24, Take 15 mg by mouth daily.  Current Outpatient Medications (Hematological):    folic acid (FOLVITE) 1 MG tablet, Take 1 tablet (1 mg total) by mouth daily.  Current Outpatient Medications  (Other):    gabapentin (NEURONTIN) 100 MG capsule, Take 2 capsules (200 mg total) by mouth 2 (two) times daily.   traZODone (DESYREL) 50 MG tablet, Take 1 tablet (50 mg total) by mouth at bedtime as needed for sleep.   Biotin 5000 MCG TABS, Take 10,000 mcg by mouth every evening.   Calcium-Magnesium-Zinc (CAL-MAG-ZINC PO), Take 1 tablet by mouth in the morning.   Carboxymethylcellul-Glycerin (LUBRICATING EYE DROPS OP), Place 1 drop into both eyes daily as needed (dry eyes).   cholecalciferol (VITAMIN D) 25 MCG (1000 UNIT) tablet, Take 1,000 Units by mouth in the morning.   cyclobenzaprine (FLEXERIL) 10 MG tablet, Take 1 tablet (10 mg total) by mouth 3 (three) times daily as needed for muscle spasms.   diazepam (  VALIUM) 5 MG tablet, TAKE 1 TABLET BY MOUTH AT BEDTIME AS NEEDED FOR MUSCLE SPASMS. (Patient not taking: Reported on 05/14/2022)   diclofenac sodium (VOLTAREN) 1 % GEL, APPLY 3 GRAMS TO 3 LARGE JOINTS 3 TIMES A DAY AS NEEDED   DULoxetine (CYMBALTA) 20 MG capsule, TAKE 1 CAPSULE BY MOUTH EVERY DAY   famotidine (PEPCID) 10 MG tablet, Take 10 mg by mouth in the morning.   gabapentin (NEURONTIN) 100 MG capsule, Take 2 capsules (200 mg total) by mouth 3 (three) times daily.   Magnesium Bisglycinate (MAG GLYCINATE PO), Take 400 mg by mouth daily. 200 mg per tab   methenamine (HIPREX) 1 g tablet, Take 1 tablet by mouth 2 (two) times daily as needed (UTI). (Patient not taking: Reported on 05/14/2022)   methotrexate (RHEUMATREX) 2.5 MG tablet, TAKE 6 TABLETS (15 MG TOTAL) BY MOUTH ONCE A WEEK. CAUTION:CHEMOTHERAPY. PROTECT FROM LIGHT.   polyethylene glycol (MIRALAX / GLYCOLAX) 17 g packet, Take 17 g by mouth every other day. In the morning.   TART CHERRY PO, Take 2,000 mg by mouth daily. 1000 mg per tab   TUBERCULIN SYR 1CC/27GX1/2" 27G X 1/2" 1 ML MISC, Use 1 syringe once weekly to inject methotrexate.   Reviewed prior external information including notes and imaging from  primary care  provider As well as notes that were available from care everywhere and other healthcare systems.  Past medical history, social, surgical and family history all reviewed in electronic medical record.  No pertanent information unless stated regarding to the chief complaint.   Review of Systems:  No headache, visual changes, nausea, vomiting, diarrhea, constipation, dizziness, abdominal pain, skin rash, fevers, chills, night sweats, weight loss, swollen lymph nodes,  joint swelling, chest pain, shortness of breath, mood changes. POSITIVE muscle aches, body aches   Objective  Blood pressure 120/64, height 5\' 1"  (1.549 m), weight 116 lb (52.6 kg).   General: No apparent distress alert and oriented x3 mood and affect normal, dressed appropriately.  HEENT: Pupils equal, extraocular movements intact  Respiratory: Patient's speak in full sentences and does not appear short of breath  Cardiovascular: No lower extremity edema, non tender, no erythema   Neck exam  does have some improvement noted.  Patient still does have some limited sidebending bilaterally.  Neurovascularly intact except does have some numbness noted in the C8 distribution.    Impression and Recommendations:    The above documentation has been reviewed and is accurate and complete Judi Saa, DO

## 2022-06-19 ENCOUNTER — Encounter: Payer: Self-pay | Admitting: Family Medicine

## 2022-06-19 ENCOUNTER — Ambulatory Visit: Payer: Medicare Other | Admitting: Family Medicine

## 2022-06-19 VITALS — BP 120/64 | Ht 61.0 in | Wt 116.0 lb

## 2022-06-19 DIAGNOSIS — M5412 Radiculopathy, cervical region: Secondary | ICD-10-CM

## 2022-06-19 MED ORDER — GABAPENTIN 100 MG PO CAPS: 200.0000 mg | ORAL_CAPSULE | Freq: Two times a day (BID) | ORAL | 0 refills | Status: DC

## 2022-06-19 MED ORDER — TRAZODONE HCL 50 MG PO TABS: 50.0000 mg | ORAL_TABLET | Freq: Every evening | ORAL | 3 refills | Status: DC | PRN

## 2022-06-19 NOTE — Assessment & Plan Note (Addendum)
Still not fully released from patient's surgery.  This was the anterior aspect of the cervical radiculopathy. Still has cervical radiculopathy on the C8 distribution.  Increase gabapentin to up to 400 mg at night, continue Cymbalta 20 mg.  Worsening pain may need to consider other things but I do feel that for her well physical therapy will be the most helpful.  Will wait though until released from neurosurgery. Trazadone at night also rx warned of side effects

## 2022-06-19 NOTE — Patient Instructions (Addendum)
Trazadone '50mg'$  at night start 1/2 pill Gabapentin '200mg'$  2x a day (which means up to 4 at night) 1/2 the flexeril Continue to follow up with neuro surgery See you again in 2 months

## 2022-06-22 ENCOUNTER — Encounter: Payer: Self-pay | Admitting: Internal Medicine

## 2022-06-22 NOTE — Progress Notes (Unsigned)
Subjective:    Patient ID: Sherri [name], female    DOB: 1940/11/24, 82 y.o.   MRN: [id number]      HPI [name] is here for No chief complaint on file.    Sleep issues -     Stomach issues -    Medications and allergies reviewed with patient and updated if appropriate.  Current Outpatient Medications on File Prior to Visit  Medication Sig Dispense Refill   atorvastatin (LIPITOR) 10 MG tablet TAKE 1 TAB ON MONDAYS AND FRIDAY 27 tablet 3   Biotin 5000 MCG TABS Take 10,000 mcg by mouth every evening.     Calcium-Magnesium-Zinc (CAL-MAG-ZINC PO) Take 1 tablet by mouth in the morning.     Carboxymethylcellul-Glycerin (LUBRICATING EYE DROPS OP) Place 1 drop into both eyes daily as needed (dry eyes).     carvedilol (COREG) 6.25 MG tablet TAKE 1 TABLET BY MOUTH TWICE A DAY WITH MEALS 180 tablet 1   cholecalciferol (VITAMIN D) 25 MCG (1000 UNIT) tablet Take 1,000 Units by mouth in the morning.     cyclobenzaprine (FLEXERIL) 10 MG tablet Take 1 tablet (10 mg total) by mouth 3 (three) times daily as needed for muscle spasms. 90 tablet 2   denosumab (PROLIA) 60 MG/ML SOSY injection Inject 60 mg into the skin every 6 (six) months. Deliver to rheum: [address] [place], [place], [place] [address]. Appt on 01/09/22 1 mL 0   diazepam (VALIUM) 5 MG tablet TAKE 1 TABLET BY MOUTH AT BEDTIME AS NEEDED FOR MUSCLE SPASMS. (Patient not taking: Reported on 05/14/2022) 30 tablet 2   diclofenac sodium (VOLTAREN) 1 % GEL APPLY 3 GRAMS TO 3 LARGE JOINTS 3 TIMES A DAY AS NEEDED 100 g 0   DULoxetine (CYMBALTA) 20 MG capsule TAKE 1 CAPSULE BY MOUTH EVERY DAY 90 capsule 1   famotidine (PEPCID) 10 MG tablet Take 10 mg by mouth in the morning.     folic acid (FOLVITE) 1 MG tablet Take 1 tablet (1 mg total) by mouth daily. 90 tablet 3   furosemide (LASIX) 20 MG tablet TAKE 1 TABLET BY MOUTH EVERY DAY 90 tablet 1   gabapentin (NEURONTIN) 100 MG capsule Take 2 capsules (200 mg total) by mouth 3 (three)  times daily. 180 capsule 3   gabapentin (NEURONTIN) 100 MG capsule Take 2 capsules (200 mg total) by mouth 2 (two) times daily. 180 capsule 0   HYDROcodone-acetaminophen (NORCO/VICODIN) 5-325 MG tablet Take 1 tablet by mouth every 4 (four) hours as needed for moderate pain ((score 4 to 6)). (Patient not taking: Reported on 05/14/2022) 30 tablet 0   Magnesium Bisglycinate (MAG GLYCINATE PO) Take 400 mg by mouth daily. 200 mg per tab     methenamine (HIPREX) 1 g tablet Take 1 tablet by mouth 2 (two) times daily as needed (UTI). (Patient not taking: Reported on 05/14/2022)     methotrexate (RHEUMATREX) 2.5 MG tablet TAKE 6 TABLETS (15 MG TOTAL) BY MOUTH ONCE A WEEK. CAUTION:CHEMOTHERAPY. PROTECT FROM LIGHT. 72 tablet 0   polyethylene glycol (MIRALAX / GLYCOLAX) 17 g packet Take 17 g by mouth every other day. In the morning.     predniSONE (DELTASONE) 5 MG tablet 1 tablet by mouth every morning 90 tablet 1   TART CHERRY PO Take 2,000 mg by mouth daily. 1000 mg per tab     traMADol (ULTRAM) 50 MG tablet TAKE 1 TABLET (50 MG TOTAL) BY MOUTH 3 (THREE) TIMES DAILY AS NEEDED FOR SEVERE PAIN. (  Patient not taking: Reported on 05/14/2022) 90 tablet 0   traZODone (DESYREL) 50 MG tablet Take 1 tablet (50 mg total) by mouth at bedtime as needed for sleep. 30 tablet 3   TUBERCULIN SYR 1CC/27GX1/2" 27G X 1/2" 1 ML MISC Use 1 syringe once weekly to inject methotrexate. 12 each 3   Upadacitinib ER ([name]) 15 MG TB24 Take 15 mg by mouth daily.     No current facility-administered medications on file prior to visit.    Review of Systems     Objective:  There were no vitals filed for this visit. BP Readings from Last 3 Encounters:  06/19/22 120/64  05/14/22 118/77  05/01/22 122/68   Wt Readings from Last 3 Encounters:  06/19/22 116 lb (52.6 kg)  05/14/22 110 lb 9.6 oz (50.2 kg)  05/01/22 110 lb (49.9 kg)   There is no height or weight on file to calculate BMI.    Physical Exam         Assessment  & Plan:    See Problem List for Assessment and Plan of chronic medical problems.

## 2022-06-23 ENCOUNTER — Ambulatory Visit (INDEPENDENT_AMBULATORY_CARE_PROVIDER_SITE_OTHER): Payer: Medicare Other | Admitting: Internal Medicine

## 2022-06-23 VITALS — BP 122/78 | HR 75 | Temp 98.0°F | Ht 61.0 in | Wt 116.0 lb

## 2022-06-23 DIAGNOSIS — I10 Essential (primary) hypertension: Secondary | ICD-10-CM | POA: Diagnosis not present

## 2022-06-23 DIAGNOSIS — K5900 Constipation, unspecified: Secondary | ICD-10-CM | POA: Diagnosis not present

## 2022-06-23 DIAGNOSIS — F5101 Primary insomnia: Secondary | ICD-10-CM

## 2022-06-23 MED ORDER — CARVEDILOL 3.125 MG PO TABS: 3.1250 mg | ORAL_TABLET | Freq: Two times a day (BID) | ORAL | 3 refills | Status: DC

## 2022-06-23 MED ORDER — DULOXETINE HCL 20 MG PO CPEP: 40.0000 mg | ORAL_CAPSULE | Freq: Every day | ORAL | 1 refills | Status: DC

## 2022-06-23 MED ORDER — TRULANCE 3 MG PO TABS: 3.0000 mg | ORAL_TABLET | Freq: Every day | ORAL | 5 refills | Status: DC

## 2022-06-23 NOTE — Assessment & Plan Note (Signed)
Blood pressure well-controlled, but she does appear to be having some orthostatic hypotension associated with lightheadedness Decrease Coreg 3.125 mg twice daily Follow-up in 4 weeks

## 2022-06-23 NOTE — Patient Instructions (Addendum)
Medications changes include :   trulance 3 mg daily for constipation. Increase cymbalta 40 mg daily.    Take the colace daily 1-3 pills a day - change as needed.   Decrease coreg to 3.125 mg twice daily     Return in about 4 weeks (around 07/21/2022) for follow up.    Constipation, Adult Constipation is when a person has fewer than three bowel movements in a week, has difficulty having a bowel movement, or has stools (feces) that are dry, hard, or larger than normal. Constipation may be caused by an underlying condition. It may become worse with age if a person takes certain medicines and does not take in enough fluids. Follow these instructions at home: Eating and drinking  Eat foods that have a lot of fiber, such as beans, whole grains, and fresh fruits and vegetables. Limit foods that are low in fiber and high in fat and processed sugars, such as fried or sweet foods. These include french fries, hamburgers, cookies, candies, and soda. Drink enough fluid to keep your urine pale yellow. General instructions Exercise regularly or as told by your health care provider. Try to do 150 minutes of moderate exercise each week. Use the bathroom when you have the urge to go. Do not hold it in. Take over-the-counter and prescription medicines only as told by your health care provider. This includes any fiber supplements. During bowel movements: Practice deep breathing while relaxing the lower abdomen. Practice pelvic floor relaxation. Watch your condition for any changes. Let your health care provider know about them. Keep all follow-up visits as told by your health care provider. This is important. Contact a health care provider if: You have pain that gets worse. You have a fever. You do not have a bowel movement after 4 days. You vomit. You are not hungry or you lose weight. You are bleeding from the opening between the buttocks (anus). You have thin, pencil-like stools. Get  help right away if: You have a fever and your symptoms suddenly get worse. You leak stool or have blood in your stool. Your abdomen is bloated. You have severe pain in your abdomen. You feel dizzy or you faint. Summary Constipation is when a person has fewer than three bowel movements in a week, has difficulty having a bowel movement, or has stools (feces) that are dry, hard, or larger than normal. Eat foods that have a lot of fiber, such as beans, whole grains, and fresh fruits and vegetables. Drink enough fluid to keep your urine pale yellow. Take over-the-counter and prescription medicines only as told by your health care provider. This includes any fiber supplements. This information is not intended to replace advice given to you by your health care provider. Make sure you discuss any questions you have with your health care provider. Document Revised: 03/30/2019 Document Reviewed: 03/30/2019 Elsevier Patient Education  [place].

## 2022-06-23 NOTE — Assessment & Plan Note (Signed)
Chronic Taking probiotics Tried miralax, but that stopped working Has tried stool softener Tries to eat a high-fiber diet Could increase water intake which she will do Start trulance 3 mg daily Take stool softener daily 1 to 3 pills-adjust needed Can try MiraLAX again Continue probiotics Follow-up 2 weeks

## 2022-06-23 NOTE — Assessment & Plan Note (Signed)
Somewhat chronic in nature Trazodone 50 mg at night not effective-will stop for now She feels that the Cymbalta was more effective and there is likely an underlying anxiety component Restart Cymbalta 20 mg at night-can increase to 40 mg continue gabapentin at night-consider increasing this as well

## 2022-07-03 ENCOUNTER — Other Ambulatory Visit (HOSPITAL_COMMUNITY): Payer: Self-pay

## 2022-07-03 ENCOUNTER — Telehealth: Payer: Self-pay | Admitting: *Deleted

## 2022-07-03 NOTE — Telephone Encounter (Signed)
Medication Samples have been provided to the patient.  Drug name: [name]       Strength: 15 mg        Qty: 2  LOT: [id number]  Exp.Date: 10/12/2023 and 08/18/2023  Dosing instructions: Take one tablet by mouth daily.

## 2022-07-08 DIAGNOSIS — M4802 Spinal stenosis, cervical region: Secondary | ICD-10-CM | POA: Diagnosis not present

## 2022-07-16 ENCOUNTER — Telehealth: Payer: Self-pay | Admitting: Pharmacist

## 2022-07-16 ENCOUNTER — Other Ambulatory Visit (HOSPITAL_COMMUNITY): Payer: Self-pay

## 2022-07-16 DIAGNOSIS — M810 Age-related osteoporosis without current pathological fracture: Secondary | ICD-10-CM

## 2022-07-16 DIAGNOSIS — Z79899 Other long term (current) drug therapy: Secondary | ICD-10-CM

## 2022-07-16 NOTE — Telephone Encounter (Signed)
Patient due for Prolia on 07/08/2022.   Labs on 05/14/2022.  Copay for Prolia through pharmacy benefit is $300. Spoke with patient she is comfortable with copay. Rx has been sent to [facility] to be couriered to clinic.   Called patient - will need updated labs. She will stop by Monday, 07/21/2022 for labwork.  Knox Saliva, PharmD, MPH, BCPS, CPP Clinical Pharmacist (Rheumatology and Pulmonology)

## 2022-07-17 ENCOUNTER — Other Ambulatory Visit: Payer: Self-pay | Admitting: *Deleted

## 2022-07-17 ENCOUNTER — Other Ambulatory Visit: Payer: Self-pay

## 2022-07-17 ENCOUNTER — Other Ambulatory Visit (HOSPITAL_COMMUNITY): Payer: Self-pay

## 2022-07-17 DIAGNOSIS — Z79899 Other long term (current) drug therapy: Secondary | ICD-10-CM | POA: Diagnosis not present

## 2022-07-17 MED ORDER — DENOSUMAB 60 MG/ML ~~LOC~~ SOSY
60.0000 mg | PREFILLED_SYRINGE | SUBCUTANEOUS | 0 refills | Status: AC
  Filled 2022-07-17: qty 1, 180d supply, fill #0

## 2022-07-17 NOTE — Progress Notes (Signed)
Office Visit Note  Patient: Sherri [name]             Date of Birth: 1941-05-04           MRN: [id number]             PCP: Binnie Rail, MD Referring: Binnie Rail, MD Visit Date: 07/31/2022 Occupation: '@GUAROCC'$ @  Subjective:  Chronic pain   History of Present Illness: Sherri [name] is a 82 y.o. female with history of seropositive rheumatoid arthritis, osteoarthritis, and osteoporosis.  Patient remains on [name] 15 mg 1 tablet by mouth daily, MTX 6 tabs weekly, folic acid 1 mg p.o, prednisone 5 mg 1 tablet by mouth daily.  She continues to tolerate combination therapy without any side effects and has not missed any doses recently.  She denies any recent or recurrent infections.  She has not had any recent rheumatoid arthritis flares.  She continues to have chronic pain in her neck and lower back.  She remains under the care of her spine specialist. Her last Prolia injection was administered on 07/24/2022.   Activities of Daily Living:  Patient reports morning stiffness for 2 hours.   Patient Reports nocturnal pain.  Difficulty dressing/grooming: Denies Difficulty climbing stairs: Denies Difficulty getting out of chair: Denies Difficulty using hands for taps, buttons, cutlery, and/or writing: Reports  Review of Systems  Constitutional:  Positive for fatigue.  HENT:  Positive for mouth dryness. Negative for mouth sores.   Eyes:  Positive for dryness.  Respiratory: Negative.  Negative for shortness of breath.   Cardiovascular: Negative.  Negative for chest pain and palpitations.  Gastrointestinal:  Positive for constipation. Negative for blood in stool and diarrhea.  Endocrine: Negative.  Negative for increased urination.  Genitourinary: Negative.  Negative for involuntary urination.  Musculoskeletal:  Positive for joint pain, gait problem, joint pain, joint swelling, myalgias, muscle weakness, morning stiffness, muscle tenderness and myalgias.  Skin: Negative.  Negative  for color change, rash, hair loss and sensitivity to sunlight.  Allergic/Immunologic: Positive for susceptible to infections.  Neurological:  Positive for dizziness. Negative for headaches.  Hematological: Negative.  Negative for swollen glands.  Psychiatric/Behavioral:  Positive for sleep disturbance. Negative for depressed mood. The patient is not nervous/anxious.     PMFS History:  Patient Active Problem List   Diagnosis Date Noted   Cervical spondylosis with myelopathy and radiculopathy 04/23/2022   Constipation 03/04/2022   Upper back pain on left side 03/04/2022   Lumbar vertebral fracture, pathologic 09/20/2021   Degenerative spondylolisthesis 08/19/2021   Acute left lumbar radiculopathy 07/05/2021   Closed fracture of rib of right side with delayed healing 05/29/2021   Senile purpura ([place]) 12/19/2020   Restless leg 02/02/2020   Bilateral leg edema 01/11/2020   Cervical radiculopathy at C8 06/28/2019   Carpal tunnel syndrome on both sides 06/28/2019   Right sided sciatica 04/14/2018   Trochanteric bursitis of right hip 04/14/2018   H/O cervical spine surgery 11/10/2017   Dysphagia 10/26/2017   Gastroesophageal reflux disease 08/06/2017   Easy bruising 08/06/2017   Mid back pain 03/26/2017   Submandibular gland swelling 01/08/2017   High risk medication use 09/02/2016   Piriformis muscle pain 09/02/2016   Right hip pain 04/08/2016   Right knee pain 02/25/2016   Numbness and tingling in left hand 11/14/2014   Biceps tendon tear 10/10/2014   Insomnia 10/10/2014   Gait disturbance 06/13/2014   OP (osteoporosis) 01/18/2014   Other long term (current) drug therapy  01/18/2014   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 01/01/2013   Personal history of colonic polyps 07/03/2010   Nocturia 05/28/2010   THYROID NODULE 05/17/2008   Multiple sclerosis ([place]) 05/17/2008   Other fatigue 03/30/2007   Hyperlipidemia 08/20/2006   Essential hypertension 08/20/2006   Rheumatoid  arthritis ([place]) 08/20/2006   CERVICAL CANCER, HX OF 08/20/2006   GASTRIC ULCER, HX OF 08/20/2006    Past Medical History:  Diagnosis Date   Anxiety    takes Valium daily as needed   Basal cell carcinoma 01/21/1988   left nostril (MOHS), sup-right calf (CX35FU)   Basal cell carcinoma 03/22/1996   right calf (CX35FU)   Basal cell carcinoma 03/31/1995   left wing nose   Bruises easily    d/t meds   Cancer (HCC)    basal cell ca, in situ- uterine    Cataracts, bilateral    removed bilateral   Chronic back pain    Dizziness    r/t to meds   GERD (gastroesophageal reflux disease)    no meds on a regular basis but will take Tums if needed   Headache(784.0)    r/t neck issues   History of bronchitis 6-85yr ago   History of colon polyps    benign   HOH (hard of hearing)    wears hearing aids   Hyperlipidemia    takes Atorvastatin on Mondays and Fridays   Hypertension    Joint pain    Joint swelling    Multiple sclerosis ([place]    Ne[place]romuscular disorder ([place]    Dr. RPark Liter [facility], follows M.S.   Nocturia    PONV (postoperative nausea and vomiting)    trouble urinating after surgery in 2014   Postoperative nausea and vomiting 01/11/2019   Rheumatoid arthritis ([place]    Dr VJani Gravelweekly, RA- hands- knees- feet    Rheumatoid arthritis(714.0)    Dr DEstanislado Pandytakes XMorrie Sheldondaily   Right wrist fracture    Skin cancer    Squamous cell carcinoma of skin 11/02/2001   in situ-right knee (cx317f   Squamous cell carcinoma of skin 11/12/2011   in situ-left forearm (CX35FU), in situ-left foot (CX35FU)   Squamous cell carcinoma of skin 01/22/2012   in situ-left lower forearm (txpbx)   Squamous cell carcinoma of skin 11/17/2012   right shin (txpbx)   Squamous cell carcinoma of skin 10/28/2013   in situ-right shoulder (CX35FU)   Squamous cell carcinoma of skin 04/28/2014   well diff-right shin (txpbx)   Squamous cell carcinoma of skin 11/02/2014   in  situ-Left shin (txpbx)   Squamous cell carcinoma of skin 12/15/2014   in situ-Left hand (txpbx), bowens-left side chest (txpbx)   Squamous cell carcinoma of skin 05/09/2015   in situ-left hand (txpbx)    Squamous cell carcinoma of skin 05/07/2016   in situ-left inner shin,ant, in situ-left inner shin, post, in situ-left outer forearm, in situ-right knuckle   Squamous cell carcinoma of skin 03/1302018   in situ-left shoulder (txpbx), in situ-right inner shin (txpbx), in situ-top of left foot (txpbx), KA- left forearm (txpbx)   Squamous cell carcinoma of skin 12/02/2016   in situ-left inner shin (txpbx)   Squamous cell carcinoma of skin 03/04/2017   in situ-left outer sup, shin (txpbx), in situ- right 2nd knuckle finger (txpbx), in situ- Left wrist (txpbx)   Squamous cell carcinoma of skin 04/06/2018   in situ-above left knee inner (txpbx)   Squamous cell carcinoma of skin  04/21/2018   in situ-right top hand (txpbx)   Squamous cell carcinoma of skin 07/08/2018   in situ-right lower inner shin (txpbx)   Squamous cell carcinoma of skin 04/27/2019   in situ- Left neck(CX35FU), In situ- right neck (CX35FU)   Urinary retention    sees Dr.Wrenn about 2 times a yr    Family History  Problem Relation Age of Onset   Cancer Mother        pancreatic   Diabetes Mother    Pancreatic cancer Mother    Heart disease Father        Rheumatic   Cancer Father        ? stomach   Stomach cancer Father    Cancer Sister        stomach   Stomach cancer Sister    Cancer Maternal Aunt        X 4; ? primary   Heart disease Paternal Aunt    Cancer Maternal Grandmother        cervical   Colon cancer Other        Aunts   Multiple sclerosis Daughter    Hypertension Neg Hx    Stroke Neg Hx    Esophageal cancer Neg Hx    Rectal cancer Neg Hx    Past Surgical History:  Procedure Laterality Date   ABDOMINAL HYSTERECTOMY     1985   ANTERIOR CERVICAL DECOMP/DISCECTOMY FUSION N/A 04/23/2022    Procedure: Anterior Cervical Decompression/Discectomy Fusion - Cervical Seven-Thoracic One;  Surgeon: Earnie Larsson, MD;  Location: [place] [facility];  Service: Neurosurgery;  Laterality: N/A;  3C   APPENDECTOMY     with TAH   BACK SURGERY     several   BREAST BIOPSY Right    Results were negative   BROW LIFT Bilateral 10/02/2016   Procedure: BILATERAL LOWER LID BLEPHAROPLASTY;  Surgeon: Clista Bernhardt, MD;  Location: [place];  Service: Plastics;  Laterality: Bilateral;   CARPAL TUNNEL RELEASE Right    cataracts     CERVICAL FUSION     Dr Joya Salm   CERVICAL FUSION  12/31/2012   Dr Joya Salm   CERVICAL FUSION  04/2022   CHEST TUBE INSERTION     for traumatic Pneumothorax   COLONOSCOPY  07/16/2010   normal    COLONOSCOPY W/ POLYPECTOMY  1997   negative since; Dr Deatra Ina   ESOPHAGOGASTRODUODENOSCOPY  07/16/2010   normal   eye lid raise     EYE SURGERY Bilateral    cataracts removed - /w IOL   HARDWARE REMOVAL  08/2021   JOINT REPLACEMENT     LUMBAR FUSION     Dr Joya Salm   NASAL SINUS SURGERY     POSTERIOR CERVICAL FUSION/FORAMINOTOMY N/A 12/31/2012   Procedure: POSTERIOR LATERAL CERVICAL FUSION/FORAMINOTOMY LEVEL 1 CERVICAL THREE-FOUR WITH LATERAL MASS SCREWS;  Surgeon: Floyce Stakes, MD;  Location: [place] [facility];  Service: Neurosurgery;  Laterality: N/A;   PTOSIS REPAIR Bilateral 10/02/2016   Procedure: INTERNAL PTOSIS REPAIR;  Surgeon: Clista Bernhardt, MD;  Location: [place];  Service: Plastics;  Laterality: Bilateral;   SKIN BIOPSY     THYROID SURGERY     R lobe removed- 1972, has grown back - CT last done- 2018   TOTAL HIP ARTHROPLASTY  12/22/2011   Procedure: TOTAL HIP ARTHROPLASTY;  Surgeon: Kerin Salen, MD;  Location: [place];  Service: Orthopedics;  Laterality: Left;   TOTAL SHOULDER ARTHROPLASTY     TUBAL LIGATION  UPPER GASTROINTESTINAL ENDOSCOPY  2012   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   Social History   Social History Narrative   Not on file   Immunization History   Administered Date(s) Administered   Fluad Quad(high Dose 65+) 02/02/2020, 02/01/2022   Influenza Whole 02/22/2007, 02/08/2013   Influenza, High Dose Seasonal PF 02/04/2015, 02/18/2017, 02/21/2018, 01/16/2019, 01/25/2021   Influenza-Unspecified 01/24/2014, 01/25/2016, 02/18/2017, 01/16/2019, 01/25/2020   Moderna Covid-19 Vaccine Bivalent Booster 39yr & up 02/11/2022   PFIZER(Purple Top)SARS-COV-2 Vaccination 06/18/2019, 07/09/2019, 01/09/2020, 07/03/2020, 02/07/2021   PNEUMOCOCCAL CONJUGATE-20 02/26/2022   Pfizer Covid-19 Vaccine Bivalent Booster 155yr& up 02/08/2021   Pneumococcal Conjugate-13 11/23/2012, 09/08/2014   Pneumococcal Polysaccharide-23 05/26/2006   Pneumococcal-Unspecified 12/28/2012, 06/09/2020   Respiratory Syncytial Virus Vaccine,Recomb Aduvanted(Arexvy) 01/23/2022   Td 05/28/2010   Tdap 12/24/2020, 01/07/2021   Unspecified SARS-COV-2 Vaccination 06/26/2020, 02/11/2022   Varicella 11/07/2020   Zoster Recombinat (Shingrix) 02/10/2019, 04/14/2019   Zoster, Live 05/26/2009, 07/10/2020     Objective: Vital Signs: BP 126/80 (BP Location: Left Arm, Patient Position: Sitting, Cuff Size: Normal)   Pulse 79   Resp 14   Ht '5\' 1"'$  (1.549 m)   Wt 117 lb 12.8 oz (53.4 kg)   BMI 22.26 kg/m    Physical Exam Vitals and nursing note reviewed.  Constitutional:      Appearance: She is well-developed.  HENT:     Head: Normocephalic and atraumatic.  Eyes:     Conjunctiva/sclera: Conjunctivae normal.  Cardiovascular:     Rate and Rhythm: Normal rate and regular rhythm.     Heart sounds: Normal heart sounds.  Pulmonary:     Effort: Pulmonary effort is normal.     Breath sounds: Normal breath sounds.  Abdominal:     General: Bowel sounds are normal.     Palpations: Abdomen is soft.  Musculoskeletal:     Cervical back: Normal range of motion.  Skin:    General: Skin is warm and dry.     Capillary Refill: Capillary refill takes less than 2 seconds.  Neurological:      Mental Status: She is alert and oriented to person, place, and time.  Psychiatric:        Behavior: Behavior normal.      Musculoskeletal Exam: C-spine has limited range of motion.  Painful limited range of motion of the lumbar spine.  Shoulder joints, elbow joints, and wrist joints have good range of motion with no synovitis.  Synovial thickening of MCP joints but no synovitis noted.  Inflammation in the left fourth PIP and right fifth PIP joint noted.  PIP and DIP thickening consistent with osteoarthritis of both hands.  Hip joints difficult to assess in seated position.  Knee joints have good range of motion with no warmth or effusion.  Ankle joints have good range of motion with no tenderness or joint swelling.  CDAI Exam: CDAI Score: 4.8  Patient Global: 4 mm; Provider Global: 4 mm Swollen: 2 ; Tender: 2  Joint Exam 07/31/2022      Right  Left  PIP 4     Swollen Tender  PIP 5  Swollen Tender        Investigation: No additional findings.  Imaging: No results found.  Recent Labs: Lab Results  Component Value Date   WBC 4.6 07/17/2022   HGB 12.0 07/17/2022   PLT 323 07/17/2022   NA 136 07/17/2022   K 5.1 07/17/2022   CL 99 07/17/2022   CO2 29 07/17/2022  GLUCOSE 90 07/17/2022   BUN 13 07/17/2022   CREATININE 0.88 07/17/2022   BILITOT 0.5 07/17/2022   ALKPHOS 29 (L) 04/10/2020   AST 29 07/17/2022   ALT 18 07/17/2022   PROT 6.6 07/17/2022   ALBUMIN 4.2 04/10/2020   CALCIUM 9.4 07/17/2022   GFRAA 65 09/26/2020   QFTBGOLDPLUS NEGATIVE 12/12/2021    Speciality Comments: Prior therapy includes: [name] (cost), Olumiant-inadequate response, Imuran weakness, leflunomide-intolerance Prolia: 05/04/18, 11/12/18, 06/09/19, 12/07/19, 07/05/20, 01/01/21, 07/09/21, 01/09/22, 07/24/22  Procedures:  No procedures performed Allergies: Darvon [propoxyphene hcl], Demerol [meperidine], Penicillins, Imuran [azathioprine], Sulfa antibiotics, Arava [leflunomide], Lipitor [atorvastatin], and  Vytorin [ezetimibe-simvastatin]   Assessment / Plan:     Visit Diagnoses: Rheumatoid arthritis involving multiple sites with positive rheumatoid factor ([place]): Her rheumatoid arthritis remains stable on the current treatment regimen.  She continues to take [name] 15 mg 1 tablet by mouth daily, methotrexate 6 tablets by mouth once weekly, and prednisone 5 mg 1 tablet daily.  She has synovitis in the right fifth PIP and left fourth PIP joint.  No tenderness or synovitis over MCP joints.  She continues to have chronic pain in her neck and lower back which she attributes to previous surgeries.  Her mobility and activities remain limited due to the severity of pain in her neck and lower back. Overall her rheumatoid arthritis remains stable on the current treatment regimen.  She has been unable to taper the dose of prednisone.  She is aware of the risks of long-term prednisone use.  No medication changes will be made at this time.  She was advised to notify us if she develops any new or worsening symptoms.  She will follow-up in the office in 3 months or sooner if needed.  High risk medication use - [name] 15 mg 1 tablet by mouth daily, MTX 6 tabs weekly, folic acid 1 mg p.o, prednisone 5 mg 1 tablet by mouth daily. She is aware of the risks of long term prednisone use.  TB gold negative on 12/12/21.  CBC and CMP updated on 07/17/22. Her next lab work will be due in May and every 3 months. Standing orders for CBC and CMP remain in place.  No recent or recurrent infections.  Discussed the importance of holding rinvoq and methotrexate if she develops signs or symptoms of an infection and to resume once the infection has completely cleared.   Counseled on the increase risk of venous thrombosis. Counseled about FDA black box warning of MACE (major adverse CV events including cardiovascular death, myocardial infarction, and stroke).   Closed fracture of right wrist with routine healing, subsequent encounter-Doing  well. No active inflammation noted.   Primary osteoarthritis of right hip: No discomfort in the right groin at this time.   History of total hip replacement, left - Dr. Rowan-Chronic pain.  Patient has decided against using a cane or walker.   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 - She underwent an uncomplicated [id number] anterior cervical discectomy and fusion on 04/23/2022 performed by Dr. Annette Stable. Chronic pain and limited ROM.    DDD (degenerative disc disease), lumbar: Lumbar spine surgery in January 2023 x2 by Dr. Annette Stable.  Limited mobility due to severity of pain.  She has decided against using a cane or walker to assist with ambulation.   Age-related osteoporosis without current pathological fracture - Previous DEXA: 01/16/2020 T-score: -4.2. Hx of frequent fractures. DEXA updated on 04/14/22: RFN T-score -3.2.  She remains on long term prednisone 5 mg daily.  Current treatment-prolia-05/04/18, 11/12/18, 06/09/19, 12/07/19, 07/05/20, 01/01/2021, 07/09/2021, 01/09/2022, and 07/24/22. She is taking a calcium and vitamin D supplement.    Vitamin D deficiency: She is taking a daily calcium vitamin D supplement.  Other fatigue: Stable.  Primary insomnia: Gabapentin continues to be helpful at bedtime.  History of hypertension: Blood pressure is 126/80 today in the office.  History of anxiety  History of multiple sclerosis ([place])  History of hyperlipidemia  History of depression  History of cellulitis  Orders: No orders of the defined types were placed in this encounter.  No orders of the defined types were placed in this encounter.   Follow-Up Instructions: Return in about 3 months (around 10/31/2022) for Rheumatoid arthritis, Osteoarthritis, Osteoporosis.   Ofilia Neas, PA-C  Note - This record has been created using Dragon software.  Chart creation errors have been sought, but may not always  have been located. Such creation errors do not reflect on  the standard of medical  care.

## 2022-07-18 ENCOUNTER — Other Ambulatory Visit (HOSPITAL_COMMUNITY): Payer: Self-pay

## 2022-07-18 LAB — CBC WITH DIFFERENTIAL/PLATELET
Absolute Monocytes: 492 cells/uL (ref 200–950)
Basophils Absolute: 18 cells/uL (ref 0–200)
Basophils Relative: 0.4 %
Eosinophils Absolute: 9 cells/uL — ABNORMAL LOW (ref 15–500)
Eosinophils Relative: 0.2 %
HCT: 34.5 % — ABNORMAL LOW (ref 35.0–45.0)
Hemoglobin: 12 g/dL (ref 11.7–15.5)
Lymphs Abs: 998 cells/uL (ref 850–3900)
MCH: 34.5 pg — ABNORMAL HIGH (ref 27.0–33.0)
MCHC: 34.8 g/dL (ref 32.0–36.0)
MCV: 99.1 fL (ref 80.0–100.0)
MPV: 9.2 fL (ref 7.5–12.5)
Monocytes Relative: 10.7 %
Neutro Abs: 3082 cells/uL (ref 1500–7800)
Neutrophils Relative %: 67 %
Platelets: 323 10*3/uL (ref 140–400)
RBC: 3.48 10*6/uL — ABNORMAL LOW (ref 3.80–5.10)
RDW: 14.7 % (ref 11.0–15.0)
Total Lymphocyte: 21.7 %
WBC: 4.6 10*3/uL (ref 3.8–10.8)

## 2022-07-18 LAB — COMPLETE METABOLIC PANEL WITH GFR
AG Ratio: 1.8 (calc) (ref 1.0–2.5)
ALT: 18 U/L (ref 6–29)
AST: 29 U/L (ref 10–35)
Albumin: 4.2 g/dL (ref 3.6–5.1)
Alkaline phosphatase (APISO): 26 U/L — ABNORMAL LOW (ref 37–153)
BUN: 13 mg/dL (ref 7–25)
CO2: 29 mmol/L (ref 20–32)
Calcium: 9.4 mg/dL (ref 8.6–10.4)
Chloride: 99 mmol/L (ref 98–110)
Creat: 0.88 mg/dL (ref 0.60–0.95)
Globulin: 2.4 g/dL (calc) (ref 1.9–3.7)
Glucose, Bld: 90 mg/dL (ref 65–99)
Potassium: 5.1 mmol/L (ref 3.5–5.3)
Sodium: 136 mmol/L (ref 135–146)
Total Bilirubin: 0.5 mg/dL (ref 0.2–1.2)
Total Protein: 6.6 g/dL (ref 6.1–8.1)
eGFR: 66 mL/min/{1.73_m2} (ref >=60)

## 2022-07-18 NOTE — Progress Notes (Signed)
CBC stable. Alk phos is slightly low. Rest of CMP WNL.  We will continue to monitor.

## 2022-07-20 ENCOUNTER — Encounter: Payer: Self-pay | Admitting: Internal Medicine

## 2022-07-20 NOTE — Progress Notes (Unsigned)
Subjective:    Patient ID: Sherri [name], female    DOB: 1941/04/24, 82 y.o.   MRN: [id number]     HPI Sherri [name] is here for follow up of her chronic medical problems, including htn, insomnia, constipation  4 weeks ago - dec coreg dose d/t lightheadedness, orthostatic hypotension  Insomnia - increased cymbalta 40 mg daily  Constipation - started trulance daily    Medications and allergies reviewed with patient and updated if appropriate.  Current Outpatient Medications on File Prior to Visit  Medication Sig Dispense Refill   atorvastatin (LIPITOR) 10 MG tablet TAKE 1 TAB ON MONDAYS AND FRIDAY 27 tablet 3   Biotin 5000 MCG TABS Take 10,000 mcg by mouth every evening.     Calcium-Magnesium-Zinc (CAL-MAG-ZINC PO) Take 1 tablet by mouth in the morning.     Carboxymethylcellul-Glycerin (LUBRICATING EYE DROPS OP) Place 1 drop into both eyes daily as needed (dry eyes).     carvedilol (COREG) 3.125 MG tablet Take 1 tablet (3.125 mg total) by mouth 2 (two) times daily with a meal. 60 tablet 3   cholecalciferol (VITAMIN D) 25 MCG (1000 UNIT) tablet Take 1,000 Units by mouth in the morning.     cyclobenzaprine (FLEXERIL) 10 MG tablet Take 1 tablet (10 mg total) by mouth 3 (three) times daily as needed for muscle spasms. 90 tablet 2   denosumab (PROLIA) 60 MG/ML SOSY injection Inject 60 mg into the skin every 6 (six) months. Deliver to rheum: [address] [employer], [place], [place] [address]. Appt on 07/23/2022 1 mL 0   diclofenac sodium (VOLTAREN) 1 % GEL APPLY 3 GRAMS TO 3 LARGE JOINTS 3 TIMES A DAY AS NEEDED 100 g 0   DULoxetine (CYMBALTA) 20 MG capsule Take 2 capsules (40 mg total) by mouth daily. 180 capsule 1   famotidine (PEPCID) 10 MG tablet Take 10 mg by mouth in the morning.     folic acid (FOLVITE) 1 MG tablet Take 1 tablet (1 mg total) by mouth daily. 90 tablet 3   furosemide (LASIX) 20 MG tablet TAKE 1 TABLET BY MOUTH EVERY DAY 90 tablet 1   gabapentin (NEURONTIN) 100 MG  capsule Take 2 capsules (200 mg total) by mouth 2 (two) times daily. 180 capsule 0   Magnesium Bisglycinate (MAG GLYCINATE PO) Take 400 mg by mouth daily. 200 mg per tab     methotrexate (RHEUMATREX) 2.5 MG tablet TAKE 6 TABLETS (15 MG TOTAL) BY MOUTH ONCE A WEEK. CAUTION:CHEMOTHERAPY. PROTECT FROM LIGHT. 72 tablet 0   Plecanatide (TRULANCE) 3 MG TABS Take 1 tablet (3 mg total) by mouth daily. 30 tablet 5   polyethylene glycol (MIRALAX / GLYCOLAX) 17 g packet Take 17 g by mouth every other day. In the morning.     predniSONE (DELTASONE) 5 MG tablet 1 tablet by mouth every morning 90 tablet 1   TART CHERRY PO Take 2,000 mg by mouth daily. 1000 mg per tab     TUBERCULIN SYR 1CC/27GX1/2" 27G X 1/2" 1 ML MISC Use 1 syringe once weekly to inject methotrexate. 12 each 3   Upadacitinib ER ([name]) 15 MG TB24 Take 15 mg by mouth daily.     No current facility-administered medications on file prior to visit.     Review of Systems     Objective:  There were no vitals filed for this visit. BP Readings from Last 3 Encounters:  06/23/22 122/78  06/19/22 120/64  05/14/22 118/77   Wt Readings  from Last 3 Encounters:  06/23/22 116 lb (52.6 kg)  06/19/22 116 lb (52.6 kg)  05/14/22 110 lb 9.6 oz (50.2 kg)   There is no height or weight on file to calculate BMI.    Physical Exam     Lab Results  Component Value Date   WBC 4.6 07/17/2022   HGB 12.0 07/17/2022   HCT 34.5 (L) 07/17/2022   PLT 323 07/17/2022   GLUCOSE 90 07/17/2022   CHOL 214 (H) 06/28/2021   TRIG 110 06/28/2021   HDL 88 06/28/2021   LDLDIRECT 122.9 11/15/2012   LDLCALC 105 (H) 06/28/2021   ALT 18 07/17/2022   AST 29 07/17/2022   NA 136 07/17/2022   K 5.1 07/17/2022   CL 99 07/17/2022   CREATININE 0.88 07/17/2022   BUN 13 07/17/2022   CO2 29 07/17/2022   TSH 1.37 04/10/2020   INR 0.95 12/16/2011   HGBA1C 5.6 10/24/2013     Assessment & Plan:    See Problem List for Assessment and Plan of chronic medical  problems.

## 2022-07-20 NOTE — Patient Instructions (Addendum)
    Call GI to schedule an appointment Phone: [phone]    Medications changes include :   linzess 144 mcg daily x 4 days.     A referral was ordered for [facility].     Someone will call you to schedule an appointment.    Return in about 6 months (around 01/19/2023) for Physical Exam.

## 2022-07-21 ENCOUNTER — Other Ambulatory Visit: Payer: Self-pay

## 2022-07-21 ENCOUNTER — Other Ambulatory Visit (HOSPITAL_COMMUNITY): Payer: Self-pay

## 2022-07-21 ENCOUNTER — Ambulatory Visit (INDEPENDENT_AMBULATORY_CARE_PROVIDER_SITE_OTHER): Payer: Medicare Other | Admitting: Internal Medicine

## 2022-07-21 VITALS — BP 130/78 | HR 85 | Temp 98.1°F | Ht 61.0 in | Wt 117.2 lb

## 2022-07-21 DIAGNOSIS — F5101 Primary insomnia: Secondary | ICD-10-CM | POA: Diagnosis not present

## 2022-07-21 DIAGNOSIS — D692 Other nonthrombocytopenic purpura: Secondary | ICD-10-CM

## 2022-07-21 DIAGNOSIS — K5909 Other constipation: Secondary | ICD-10-CM | POA: Diagnosis not present

## 2022-07-21 DIAGNOSIS — I10 Essential (primary) hypertension: Secondary | ICD-10-CM

## 2022-07-21 MED ORDER — LINACLOTIDE 290 MCG PO CAPS: 290.0000 ug | ORAL_CAPSULE | Freq: Every day | ORAL | 5 refills | Status: DC

## 2022-07-21 NOTE — Assessment & Plan Note (Signed)
Chronic Both arms and legs are followed bruises Is not concerning and related to her thin skin-partially related to chronic prednisone use

## 2022-07-21 NOTE — Assessment & Plan Note (Addendum)
Acute on chronic Having significant constipation despite MiraLAX daily, Metamucil daily, probiotics, magnesium Linzess, Amitiza not covered by insurance, Trulance covered but co-pay is $100 She did try Amitiza in the past and it was not effective Stool softener has not helped, enema has not helped, suppositories have not helped KUB today is to evaluate and confirm she is still constipated Will refer to GI We have a few days of Linzess for her to try-samples given Linzess 72 mcg-will take 2 pills daily for 4 days I will send a prescription to her pharmacy so we can try to get a prior authorization

## 2022-07-21 NOTE — Assessment & Plan Note (Signed)
Chronic Was experiencing orthostatic hypotension to decrease her carvedilol dose-lightheadedness has resolved Continue carvedilol 3.125 mg twice daily BP at home controlled

## 2022-07-21 NOTE — Assessment & Plan Note (Signed)
Chronic Controlled Taking Flexeril 10 mg at night, gabapentin 200 mg at night

## 2022-07-22 NOTE — Telephone Encounter (Signed)
Prolia received from [facility]. Appt scheduled for 07/24/2022  Knox Saliva, PharmD, MPH, BCPS, CPP Clinical Pharmacist (Rheumatology and Pulmonology)

## 2022-07-23 ENCOUNTER — Other Ambulatory Visit: Payer: Self-pay | Admitting: Internal Medicine

## 2022-07-24 ENCOUNTER — Ambulatory Visit: Payer: Medicare Other | Attending: Rheumatology | Admitting: Pharmacist

## 2022-07-24 DIAGNOSIS — M810 Age-related osteoporosis without current pathological fracture: Secondary | ICD-10-CM | POA: Diagnosis not present

## 2022-07-24 DIAGNOSIS — Z7689 Persons encountering health services in other specified circumstances: Secondary | ICD-10-CM

## 2022-07-24 MED ORDER — DENOSUMAB 60 MG/ML ~~LOC~~ SOSY
60.0000 mg | PREFILLED_SYRINGE | Freq: Once | SUBCUTANEOUS | Status: AC
  Administered 2022-07-24: 60 mg via SUBCUTANEOUS

## 2022-07-24 NOTE — Progress Notes (Signed)
Pharmacy Note  Subjective:   Patient presents to clinic today to receive bi-annual dose of Prolia. Patient's last dose of Prolia was on 01/09/2022. Reports no issues with Prolia in past.  Patient running a fever or have signs/symptoms of infection? No  Patient currently on antibiotics for the treatment of infection? No  Patient had fall in the last 6 months?  No  Patient taking calcium 1200 mg daily through diet or supplement and at least 800 units vitamin D? Yes  Objective: CMP     Component Value Date/Time   NA 136 07/17/2022 1459   NA 139 06/20/2019 1335   NA 136 02/22/2015 0000   K 5.1 07/17/2022 1459   K 4.4 02/22/2015 0000   CL 99 07/17/2022 1459   CL 98 02/22/2015 0000   CO2 29 07/17/2022 1459   CO2 30 02/22/2015 0000   GLUCOSE 90 07/17/2022 1459   BUN 13 07/17/2022 1459   BUN 10 06/20/2019 1335   CREATININE 0.88 07/17/2022 1459   CALCIUM 9.4 07/17/2022 1459   CALCIUM 9.6 02/22/2015 0000   PROT 6.6 07/17/2022 1459   PROT 6.4 06/20/2019 1335   PROT 6.6 02/22/2015 0000   ALBUMIN 4.2 04/10/2020 0914   ALBUMIN 4.2 06/20/2019 1335   ALBUMIN 4.3 02/22/2015 0000   AST 29 07/17/2022 1459   AST 24 02/22/2015 0000   ALT 18 07/17/2022 1459   ALT 18 02/22/2015 0000   ALKPHOS 29 (L) 04/10/2020 0914   ALKPHOS 34 02/22/2015 0000   BILITOT 0.5 07/17/2022 1459   BILITOT 0.3 06/20/2019 1335   GFRNONAA >60 04/16/2022 1400   GFRNONAA 56 (L) 09/26/2020 1536   GFRAA 65 09/26/2020 1536    CBC    Component Value Date/Time   WBC 4.6 07/17/2022 1459   RBC 3.48 (L) 07/17/2022 1459   HGB 12.0 07/17/2022 1459   HGB 13.0 06/20/2019 1335   HCT 34.5 (L) 07/17/2022 1459   HCT 38.0 06/20/2019 1335   PLT 323 07/17/2022 1459   PLT 363 06/20/2019 1335   MCV 99.1 07/17/2022 1459   MCV 100 (H) 06/20/2019 1335   MCH 34.5 (H) 07/17/2022 1459   MCHC 34.8 07/17/2022 1459   RDW 14.7 07/17/2022 1459   RDW 13.4 06/20/2019 1335   LYMPHSABS 998 07/17/2022 1459   LYMPHSABS 0.6 (L)  06/20/2019 1335   MONOABS 0.7 03/30/2021 2317   EOSABS 9 (L) 07/17/2022 1459   EOSABS 0.0 06/20/2019 1335   BASOSABS 18 07/17/2022 1459   BASOSABS 0.0 06/20/2019 1335    Lab Results  Component Value Date   VD25OH 49 06/28/2021    T-score: -4.2. DEXA: 01/16/2020. Last completed 04/21/2022-  no significant changes to BMD  Assessment/Plan:   Reviewed importance of adequate dietary intake of calcium in addition to supplementation due to risk of hypocalcemia with Prolia.   Patient tolerated injection without issue.   Administrations This Visit     denosumab (PROLIA) injection 60 mg     Admin Date 07/24/2022 Action Given Dose 60 mg Route Subcutaneous Administered By Cassandria Anger, RPH-CPP           Patient's next Prolia dose is due on 01/20/2023.  Patient is due for updated DEXA in November 2025.   All questions encouraged and answered.  Instructed patient to call with any further questions or concerns.   Knox Saliva, PharmD, MPH, BCPS, CPP Clinical Pharmacist (Rheumatology and Pulmonology)

## 2022-07-28 ENCOUNTER — Other Ambulatory Visit: Payer: Self-pay | Admitting: Rheumatology

## 2022-07-28 ENCOUNTER — Telehealth: Payer: Self-pay | Admitting: *Deleted

## 2022-07-28 ENCOUNTER — Encounter: Payer: Self-pay | Admitting: Physical Therapy

## 2022-07-28 ENCOUNTER — Ambulatory Visit: Payer: Medicare Other | Admitting: Physical Therapy

## 2022-07-28 DIAGNOSIS — M542 Cervicalgia: Secondary | ICD-10-CM | POA: Diagnosis not present

## 2022-07-28 DIAGNOSIS — M6281 Muscle weakness (generalized): Secondary | ICD-10-CM | POA: Diagnosis not present

## 2022-07-28 DIAGNOSIS — R262 Difficulty in walking, not elsewhere classified: Secondary | ICD-10-CM

## 2022-07-28 NOTE — Telephone Encounter (Signed)
Next Visit: 07/31/2022  Last Visit: 05/14/2022  Last Fill: 12/12/2021  Dx: Rheumatoid arthritis involving multiple sites with positive rheumatoid factor    Current Dose per office note on 05/14/2022: prednisone 5 mg 1 tablet by mouth daily.    Okay to refill Prednisone?

## 2022-07-28 NOTE — Therapy (Signed)
OUTPATIENT PHYSICAL THERAPY CERVICAL EVALUATION   Patient Name: Sherri [name] MRN: [id number] DOB:July 17, 1940, 82 y.o., female Today's Date: 07/28/2022  END OF SESSION:  PT End of Session - 07/28/22 1430     Visit Number 1    Number of Visits 16    Date for PT Re-Evaluation 10/20/22    Authorization Type UHC-VL med [place]    PT Start Time 1432    PT Stop Time 1519    PT Time Calculation (min) 47 min    Activity Tolerance Patient tolerated treatment well             Past Medical History:  Diagnosis Date   Anxiety    takes Valium daily as needed   Basal cell carcinoma 01/21/1988   left nostril (MOHS), sup-right calf (CX35FU)   Basal cell carcinoma 03/22/1996   right calf (CX35FU)   Basal cell carcinoma 03/31/1995   left wing nose   Bruises easily    d/t meds   Cancer (HCC)    basal cell ca, in situ- uterine    Cataracts, bilateral    removed bilateral   Chronic back pain    Dizziness    r/t to meds   GERD (gastroesophageal reflux disease)    no meds on a regular basis but will take Tums if needed   Headache(784.0)    r/t neck issues   History of bronchitis 6-56yr ago   History of colon polyps    benign   HOH (hard of hearing)    wears hearing aids   Hyperlipidemia    takes Atorvastatin on Mondays and Fridays   Hypertension    Joint pain    Joint swelling    Multiple sclerosis ([place]    Neuromuscular disorder ([place]    Dr. RPark Liter [facility], follows [facility]   Nocturia    PONV (postoperative nausea and vomiting)    trouble urinating after surgery in 2014   Postoperative nausea and vomiting 01/11/2019   Rheumatoid arthritis ([place]    Dr VJani Gravelweekly, RA- hands- knees- feet    Rheumatoid arthritis(714.0)    Dr DEstanislado Pandytakes XMorrie Sheldondaily   Right wrist fracture    Skin cancer    Squamous cell carcinoma of skin 11/02/2001   in situ-right knee (cx397f   Squamous cell carcinoma of skin 11/12/2011   in situ-left forearm (CX35FU), in  situ-left foot (CX35FU)   Squamous cell carcinoma of skin 01/22/2012   in situ-left lower forearm (txpbx)   Squamous cell carcinoma of skin 11/17/2012   right shin (txpbx)   Squamous cell carcinoma of skin 10/28/2013   in situ-right shoulder (CX35FU)   Squamous cell carcinoma of skin 04/28/2014   well diff-right shin (txpbx)   Squamous cell carcinoma of skin 11/02/2014   in situ-Left shin (txpbx)   Squamous cell carcinoma of skin 12/15/2014   in situ-Left hand (txpbx), bowens-left side chest (txpbx)   Squamous cell carcinoma of skin 05/09/2015   in situ-left hand (txpbx)    Squamous cell carcinoma of skin 05/07/2016   in situ-left inner shin,ant, in situ-left inner shin, post, in situ-left outer forearm, in situ-right knuckle   Squamous cell carcinoma of skin 03/1302018   in situ-left shoulder (txpbx), in situ-right inner shin (txpbx), in situ-top of left foot (txpbx), KA- left forearm (txpbx)   Squamous cell carcinoma of skin 12/02/2016   in situ-left inner shin (txpbx)   Squamous cell carcinoma of skin 03/04/2017   in situ-left outer sup, shin (  txpbx), in situ- right 2nd knuckle finger (txpbx), in situ- Left wrist (txpbx)   Squamous cell carcinoma of skin 04/06/2018   in situ-above left knee inner (txpbx)   Squamous cell carcinoma of skin 04/21/2018   in situ-right top hand (txpbx)   Squamous cell carcinoma of skin 07/08/2018   in situ-right lower inner shin (txpbx)   Squamous cell carcinoma of skin 04/27/2019   in situ- Left neck(CX35FU), In situ- right neck (CX35FU)   Urinary retention    sees Dr.Wrenn about 2 times a yr   Past Surgical History:  Procedure Laterality Date   ABDOMINAL HYSTERECTOMY     1985   ANTERIOR CERVICAL DECOMP/DISCECTOMY FUSION N/A 04/23/2022   Procedure: Anterior Cervical Decompression/Discectomy Fusion - Cervical Seven-Thoracic One;  Surgeon: Earnie Larsson, MD;  Location: [facility];  Service: Neurosurgery;  Laterality: N/A;  3C   APPENDECTOMY     with  TAH   BACK SURGERY     several   BREAST BIOPSY Right    Results were negative   BROW LIFT Bilateral 10/02/2016   Procedure: BILATERAL LOWER LID BLEPHAROPLASTY;  Surgeon: Clista Bernhardt, MD;  Location: [facility];  Service: Plastics;  Laterality: Bilateral;   CARPAL TUNNEL RELEASE Right    cataracts     CERVICAL FUSION     Dr Joya Salm   CERVICAL FUSION  12/31/2012   Dr Joya Salm   CHEST TUBE INSERTION     for traumatic Pneumothorax   COLONOSCOPY  07/16/2010   normal    COLONOSCOPY W/ POLYPECTOMY  1997   negative since; Dr Deatra Ina   ESOPHAGOGASTRODUODENOSCOPY  07/16/2010   normal   eye lid raise     EYE SURGERY Bilateral    cataracts removed - /w IOL   HARDWARE REMOVAL  08/2021   JOINT REPLACEMENT     LUMBAR FUSION     Dr Joya Salm   NASAL SINUS SURGERY     POSTERIOR CERVICAL FUSION/FORAMINOTOMY N/A 12/31/2012   Procedure: POSTERIOR LATERAL CERVICAL FUSION/FORAMINOTOMY LEVEL 1 CERVICAL THREE-FOUR WITH LATERAL MASS SCREWS;  Surgeon: Floyce Stakes, MD;  Location: [facility];  Service: Neurosurgery;  Laterality: N/A;   PTOSIS REPAIR Bilateral 10/02/2016   Procedure: INTERNAL PTOSIS REPAIR;  Surgeon: Clista Bernhardt, MD;  Location: [facility];  Service: Plastics;  Laterality: Bilateral;   SKIN BIOPSY     THYROID SURGERY     R lobe removed- 1972, has grown back - CT last done- 2018   TOTAL HIP ARTHROPLASTY  12/22/2011   Procedure: TOTAL HIP ARTHROPLASTY;  Surgeon: Kerin Salen, MD;  Location: [facility];  Service: Orthopedics;  Laterality: Left;   TOTAL SHOULDER ARTHROPLASTY     TUBAL LIGATION     UPPER GASTROINTESTINAL ENDOSCOPY  2012   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   Patient Active Problem List   Diagnosis Date Noted   Cervical spondylosis with myelopathy and radiculopathy 04/23/2022   Constipation 03/04/2022   Upper back pain on left side 03/04/2022   Lumbar vertebral fracture, pathologic 09/20/2021   Degenerative spondylolisthesis 08/19/2021   Acute left lumbar radiculopathy  07/05/2021   Closed fracture of rib of right side with delayed healing 05/29/2021   Senile purpura ([place]) 12/19/2020   Restless leg 02/02/2020   Bilateral leg edema 01/11/2020   Cervical radiculopathy at C8 06/28/2019   Carpal tunnel syndrome on both sides 06/28/2019   Right sided sciatica 04/14/2018   Trochanteric bursitis of right hip 04/14/2018   H/O cervical spine surgery 11/10/2017  Dysphagia 10/26/2017   Gastroesophageal reflux disease 08/06/2017   Easy bruising 08/06/2017   Mid back pain 03/26/2017   Submandibular gland swelling 01/08/2017   High risk medication use 09/02/2016   Piriformis muscle pain 09/02/2016   Right hip pain 04/08/2016   Right knee pain 02/25/2016   Numbness and tingling in left hand 11/14/2014   Biceps tendon tear 10/10/2014   Insomnia 10/10/2014   Gait disturbance 06/13/2014   OP (osteoporosis) 01/18/2014   Other long term (current) drug therapy 01/18/2014   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 01/01/2013   Personal history of colonic polyps 07/03/2010   Nocturia 05/28/2010   THYROID NODULE 05/17/2008   Multiple sclerosis ([place]) 05/17/2008   Other fatigue 03/30/2007   Hyperlipidemia 08/20/2006   Essential hypertension 08/20/2006   Rheumatoid arthritis ([place]) 08/20/2006   CERVICAL CANCER, HX OF 08/20/2006   GASTRIC ULCER, HX OF 08/20/2006    PCP: Billey Gosling, Lenna Sciara MD  REFERRING PROVIDER: Earnie Larsson, MD  REFERRING DIAG: [phone] (ICD-10-CM) - Spinal stenosis, cervical region  THERAPY DIAG:  Cervicalgia  Muscle weakness (generalized)  Difficulty in walking, not elsewhere classified  Rationale for Evaluation and Treatment: Rehabilitation  ONSET DATE: chronic neck pain, recent C7-T1 fusion 04/23/22  SUBJECTIVE:                                                                                                                                                                                                         SUBJECTIVE  STATEMENT: States she is still having a lot of back pain and having improved neck pain since her last cervical surgery. States she still has popping and cracking in her neck with motion. States that she has left hand numbness that was present prior to the surgery and unsure if it will improve. States her right hand feels like her hand is asleep but she can feel it. States she drops things a lot.   States she is afraid of walking outside because she fears she will fall and her back hurts so bad. She feels safer walking indoors because she has furniture and walls to use as support. States she is against using a walker at this time.   States most of her friends are at [employer] and she is unable to see them because of her and her husbands health. States that she feels like her hip goes out and she cannot get up. (Left side)  PERTINENT HISTORY:  MS, RA, hx of skin cancer, L THA, posterior cervical fusion C3-4 2006, Anterior C4-7 not with C7-T1 fusion, osteoporosis  08/19/21 - first lumbar surgery-  L3-L4 degenerative spondylolisthesis with stenosis and radiculopathy, status post L4-S1 decompression and fusion with instrumentation, (revision) 09/20/21: Osteoporosis related pathologic fracture of L4.  Patient status post recent L3-4 and remote L4-S1 decompression and fusion surgery. Reexploration of L3-S1 fusion with decompressive laminectomy and bilateral transpedicular decompressions at L4 -  PAIN:  Are you having pain? Yes: NPRS scale: 4/10 Pain location: low back Pain description: achy Aggravating factors: sitting Relieving factors: standing, walking house old activities   PRECAUTIONS: 10# lifting limit, limit lumbar bending and rotation   WEIGHT BEARING RESTRICTIONS: No  FALLS:  2x since last neck surgery, fear of falling- pt reports none but reports 2x her hip "went out" and her butt ended up on the floor  LIVING ENVIRONMENT: Lives with: lives with their family   OCCUPATION: not currently  working   PLOF: Independent  PATIENT GOALS: to be able to walk around the house and do house work. - currently not able to do housework per MD or abilities     OBJECTIVE:   DIAGNOSTIC FINDINGS:  Recent neck imaging postoperatively of cervical spine demonstrating posterior and anterior fusions   COGNITION: Overall cognitive status: Within functional limits for tasks assessed  SENSATION: Numb in left hand and tingling in right hand-not formally assessed  POSTURE: rounded shoulders and forward head    CERVICAL ROM:   Active ROM A/PROM (deg) eval  Flexion 100% limited  Extension 100% limited- reports dizziness with it  Right lateral flexion   Left lateral flexion   Right rotation 60% limited  Left rotation 80%limited *   (Blank rows = not tested) *pain  Per visual estimation     UE Measurements will assess in future sessions Upper Extremity Right 07/28/2022 Left 07/28/2022   A/PROM MMT A/PROM MMT  Shoulder Flexion      Shoulder Extension      Shoulder Abduction      Shoulder Adduction      Shoulder Internal Rotation      Shoulder External Rotation      Elbow Flexion      Elbow Extension      Wrist Flexion      Wrist Extension      Wrist Supination      Wrist Pronation      Wrist Ulnar Deviation      Wrist Radial Deviation      Grip Strength NA  NA     (Blank rows = not tested)   * pain     FUNCTIONAL TESTS: 07/28/22 2 minute walk test: 160 feet- then increased back pain and severe limp  Dynamic Gait Index:    [facility] PT Assessment - 07/28/22 0001       Standardized Balance Assessment   Standardized Balance Assessment Dynamic Gait Index      Dynamic Gait Index   Level Surface Mild Impairment    Change in Gait Speed Mild Impairment    Gait with Horizontal Head Turns Moderate Impairment   trunk rotation   Gait with Vertical Head Turns Severe Impairment   trunk motion   Gait and Pivot Turn Normal    Step Over Obstacle Normal    Step Around Obstacles  Normal    Steps Mild Impairment    Total Score 16    DGI comment: not continuous with breaks between 2 tasks           Sit to stand-use of left hand and slight loss of balance towards the left  upon standing  TODAY'S TREATMENT:                                                                                                                              DATE:   07/28/2022   Therapeutic Exercise:  Aerobic: Supine: Prone:  Seated: Hip adduction isometric ball x 15 5" holds  Standing: Neuromuscular Re-education: Manual Therapy: Gait training: with cane and how to use - focus on keeping cane in front of her not in back of her 8 minutes Self Care: Trigger Point Dry Needling:  Modalities:   PATIENT EDUCATION:  Education details: on current presentation, on HEP, on clinical outcomes score and POC, on how to safely use cane and why to use cane.  Otherwise reducing overall repetitious movements of neck and back secondary to fall surgeries and severe osteoporosis, on postop precautions Person educated: Patient Education method: Explanation, Demonstration, and Handouts Education comprehension: verbalized understanding  HOME EXERCISE PROGRAM: [id number]  ASSESSMENT:  CLINICAL IMPRESSION: Patient presents to physical therapy after recent neck fusion and revision on April 23, 2022.  Patient reports difficulty walking for greater than a minute and reports minimal pain in her neck at this time but mostly pain in her lumbar spine and weakness in left leg.  Patient also has history of multiple lumbar surgeries/revisions.  Patient also with history of falling and weakness in bilateral lower extremities contributing to current presentation.  Patient's primary goal at this time is to be able to walk for longer periods of time and would like to return to golf.  Discussed importance of discussing goals with MD as well as strategies to reduce repetitious bending and twisting at the lumbar spine at home  as she is currently trying to do multiple home care tasks that she should not be doing at this time.  Patient would greatly benefit from skilled physical therapy to improve overall all function and reduce risk of falling.  OBJECTIVE IMPAIRMENTS: decreased activity tolerance, decreased balance, decreased endurance, decreased knowledge of use of DME, decreased mobility, difficulty walking, decreased ROM, decreased strength, impaired UE functional use, postural dysfunction, and pain.   ACTIVITY LIMITATIONS: carrying, lifting, bending, standing, squatting, stairs, transfers, and locomotion level  PARTICIPATION LIMITATIONS: meal prep, cleaning, laundry, driving, and community activity  PERSONAL FACTORS: Age, Fitness, Time since onset of injury/illness/exacerbation, and 3+ comorbidities: Osteoporosis, multiple lumbar surgeries, multiple neck surgeries, history of falling  are also affecting patient's functional outcome.   REHAB POTENTIAL: Good  CLINICAL DECISION MAKING: Evolving/moderate complexity  EVALUATION COMPLEXITY: Moderate   GOALS: Goals reviewed with patient? yes  SHORT TERM GOALS: Target date: 09/08/2022  Patient will be independent in self management strategies to improve quality of life and functional outcomes. Baseline: New Program Goal status: INITIAL  2.  Patient will report at least 25% improvement in overall symptoms and/or function to demonstrate improved functional mobility Baseline: 0% better Goal status: INITIAL  3.  Patient will report using cane at all  times outside of home Baseline: Not currently doing Goal status: INITIAL     LONG TERM GOALS: Target date: 10/20/2022   Patient will report at least 75% improvement in overall symptoms and/or function to demonstrate improved functional mobility Baseline: 0% better Goal status: INITIAL  2.  Patient will be able to walk with or without cane for at least 5 minutes without leg giving out severe pain in low back to  demonstrate improved walking endurance Baseline: Unable Goal status: INITIAL  3.  Patient will score with at least 19 out of 24 on DGI to demonstrate reduced fall risk Baseline: 16 out of 24 Goal status: INITIAL  4.  Patient will report no instances where her hip gave out and her but ended up on the floor(no falls) over 3 weeks.  To demonstrate reduced falling Baseline: Currently falling Goal status: INITIAL    PLAN:  PT FREQUENCY: 1-2x/week 16 visits over 12 weeks.  PT DURATION: 12 weeks  PLANNED INTERVENTIONS: Therapeutic exercises, Therapeutic activity, Neuromuscular re-education, Balance training, Gait training, Patient/Family education, Self Care, Joint mobilization, Vestibular training, Canalith repositioning, Orthotic/Fit training, Aquatic Therapy, Dry Needling, Electrical stimulation, Cryotherapy, Moist heat, Traction, Ultrasound, Ionotophoresis '4mg'$ /ml Dexamethasone, Manual therapy, and Re-evaluation  PLAN FOR NEXT SESSION: Follow-up with cane use, lower extremity strength, walking endurance, upper extremity strength, neck range of motion as able and rotational direction   3:38 PM, 07/28/22 [name], DPT Physical Therapy with [facility]

## 2022-07-28 NOTE — Telephone Encounter (Signed)
Medication Samples have been provided to the patient.  Drug name: [name] Strength: 15 mg Qty: 2 boxes LOT: [id number] Exp.Date: 10/12/2023  Dosing instructions: Take 1 tablet by mouth daily.

## 2022-07-30 ENCOUNTER — Telehealth: Payer: Self-pay | Admitting: Family Medicine

## 2022-07-30 ENCOUNTER — Other Ambulatory Visit: Payer: Self-pay

## 2022-07-30 MED ORDER — DULOXETINE HCL 20 MG PO CPEP: 40.0000 mg | ORAL_CAPSULE | Freq: Every day | ORAL | 1 refills | Status: DC

## 2022-07-30 NOTE — Telephone Encounter (Signed)
Pt requesting refill of Cymbalta, somewhat confused on who is the prescribing provider but historically looks like Dr. Tamala Julian. Taking 2 per day, [facility] on file.

## 2022-07-30 NOTE — Telephone Encounter (Signed)
Rx refilled.

## 2022-07-31 ENCOUNTER — Other Ambulatory Visit: Payer: Self-pay

## 2022-07-31 ENCOUNTER — Encounter: Payer: Self-pay | Admitting: Physician Assistant

## 2022-07-31 ENCOUNTER — Ambulatory Visit: Payer: Medicare Other | Attending: Physician Assistant | Admitting: Physician Assistant

## 2022-07-31 VITALS — BP 126/80 | HR 79 | Resp 14 | Ht 61.0 in | Wt 117.8 lb

## 2022-07-31 DIAGNOSIS — M1611 Unilateral primary osteoarthritis, right hip: Secondary | ICD-10-CM | POA: Diagnosis not present

## 2022-07-31 DIAGNOSIS — M51369 Other intervertebral disc degeneration, lumbar region without mention of lumbar back pain or lower extremity pain: Secondary | ICD-10-CM

## 2022-07-31 DIAGNOSIS — Z79899 Other long term (current) drug therapy: Secondary | ICD-10-CM | POA: Diagnosis not present

## 2022-07-31 DIAGNOSIS — M0579 Rheumatoid arthritis with rheumatoid factor of multiple sites without organ or systems involvement: Secondary | ICD-10-CM

## 2022-07-31 DIAGNOSIS — M4312 Spondylolisthesis, cervical region: Secondary | ICD-10-CM | POA: Diagnosis not present

## 2022-07-31 DIAGNOSIS — G35 Multiple sclerosis: Secondary | ICD-10-CM

## 2022-07-31 DIAGNOSIS — Z96642 Presence of left artificial hip joint: Secondary | ICD-10-CM

## 2022-07-31 DIAGNOSIS — S62101D Fracture of unspecified carpal bone, right wrist, subsequent encounter for fracture with routine healing: Secondary | ICD-10-CM

## 2022-07-31 DIAGNOSIS — E559 Vitamin D deficiency, unspecified: Secondary | ICD-10-CM | POA: Diagnosis not present

## 2022-07-31 DIAGNOSIS — Z8679 Personal history of other diseases of the circulatory system: Secondary | ICD-10-CM | POA: Diagnosis not present

## 2022-07-31 DIAGNOSIS — Z872 Personal history of diseases of the skin and subcutaneous tissue: Secondary | ICD-10-CM

## 2022-07-31 DIAGNOSIS — R5383 Other fatigue: Secondary | ICD-10-CM | POA: Diagnosis not present

## 2022-07-31 DIAGNOSIS — Z8659 Personal history of other mental and behavioral disorders: Secondary | ICD-10-CM

## 2022-07-31 DIAGNOSIS — Z8639 Personal history of other endocrine, nutritional and metabolic disease: Secondary | ICD-10-CM

## 2022-07-31 DIAGNOSIS — M810 Age-related osteoporosis without current pathological fracture: Secondary | ICD-10-CM | POA: Diagnosis not present

## 2022-07-31 DIAGNOSIS — M5136 Other intervertebral disc degeneration, lumbar region: Secondary | ICD-10-CM

## 2022-07-31 DIAGNOSIS — F5101 Primary insomnia: Secondary | ICD-10-CM

## 2022-07-31 MED ORDER — DULOXETINE HCL 20 MG PO CPEP: 40.0000 mg | ORAL_CAPSULE | Freq: Every day | ORAL | 1 refills | Status: DC

## 2022-07-31 NOTE — Patient Instructions (Signed)
Standing Labs We placed an order today for your standing lab work.   Please have your standing labs drawn in May and every 3 months   Please have your labs drawn 2 weeks prior to your appointment so that the provider can discuss your lab results at your appointment, if possible.  Please note that you may see your imaging and lab results in [facility] before we have reviewed them. We will contact you once all results are reviewed. Please allow our office up to 72 hours to thoroughly review all of the results before contacting the office for clarification of your results.  WALK-IN LAB HOURS  Monday through Thursday from 8:00 am -12:30 pm and 1:00 pm-5:00 pm and Friday from 8:00 am-12:00 pm.  Patients with office visits requiring labs will be seen before walk-in labs.  You may encounter longer than normal wait times. Please allow additional time. Wait times may be shorter on  Monday and Thursday afternoons.  We do not book appointments for walk-in labs. We appreciate your patience and understanding with our staff.   Labs are drawn by [facility]. Please bring your co-pay at the time of your lab draw.  You may receive a bill from [facility] for your lab work.  Please note if you are on Hydroxychloroquine and and an order has been placed for a Hydroxychloroquine level,  you will need to have it drawn 4 hours or more after your last dose.  If you wish to have your labs drawn at another location, please call the office 24 hours in advance so we can fax the orders.  The office is located at [address], [place], [place], [place] [address]   If you have any questions regarding directions or hours of operation,  please call [phone].   As a reminder, please drink plenty of water prior to coming for your lab work. Thanks!

## 2022-07-31 NOTE — Telephone Encounter (Signed)
Pt informed via VM

## 2022-08-01 ENCOUNTER — Encounter: Payer: Self-pay | Admitting: Internal Medicine

## 2022-08-11 ENCOUNTER — Encounter: Payer: Medicare Other | Admitting: Physical Therapy

## 2022-08-13 DIAGNOSIS — Z961 Presence of intraocular lens: Secondary | ICD-10-CM | POA: Diagnosis not present

## 2022-08-13 DIAGNOSIS — H40051 Ocular hypertension, right eye: Secondary | ICD-10-CM | POA: Diagnosis not present

## 2022-08-13 DIAGNOSIS — H524 Presbyopia: Secondary | ICD-10-CM | POA: Diagnosis not present

## 2022-08-13 DIAGNOSIS — H04123 Dry eye syndrome of bilateral lacrimal glands: Secondary | ICD-10-CM | POA: Diagnosis not present

## 2022-08-14 ENCOUNTER — Other Ambulatory Visit: Payer: Self-pay | Admitting: Physician Assistant

## 2022-08-14 ENCOUNTER — Encounter: Payer: Medicare Other | Admitting: Physical Therapy

## 2022-08-14 NOTE — Telephone Encounter (Signed)
Last Fill: 05/27/2022  Labs: 07/17/2022 CBC stable. Alk phos is slightly low. Rest of CMP WNL.  We will continue to monitor.  Next Visit: 11/03/2022  Last Visit: 07/31/2022  DX:  Rheumatoid arthritis involving multiple sites with positive rheumatoid factor ([place])   Current Dose per office note 07/31/2022: MTX 6 tabs weekly   Okay to refill Methotrexate?

## 2022-08-18 ENCOUNTER — Encounter: Payer: Self-pay | Admitting: Physical Therapy

## 2022-08-18 ENCOUNTER — Ambulatory Visit: Payer: Medicare Other | Admitting: Physical Therapy

## 2022-08-18 DIAGNOSIS — M6281 Muscle weakness (generalized): Secondary | ICD-10-CM | POA: Diagnosis not present

## 2022-08-18 DIAGNOSIS — M8448XA Pathological fracture, other site, initial encounter for fracture: Secondary | ICD-10-CM

## 2022-08-18 DIAGNOSIS — M542 Cervicalgia: Secondary | ICD-10-CM

## 2022-08-18 DIAGNOSIS — R262 Difficulty in walking, not elsewhere classified: Secondary | ICD-10-CM

## 2022-08-18 NOTE — Therapy (Addendum)
OUTPATIENT PHYSICAL THERAPY TREATMENT NOTE  PHYSICAL THERAPY DISCHARGE SUMMARY  Visits from Start of Care: 2  Current functional level related to goals / functional outcomes: Unable to assess due to unplanned discharge    Remaining deficits: Unable to assess due to unplanned discharge    Education / Equipment: Unable to assess due to unplanned discharge    Patient agrees to discharge. Patient goals were not met. Patient is being discharged due to not returning since the last visit.   12:37 PM, 11/18/22 Tereasa Coop, DPT Physical Therapy with [facility]  Patient Name: Sherri [name] MRN: [id number] DOB:[birth year], 82 y.o., female Today's Date: 08/18/2022  PCP: Cheryll Cockayne, Shela Commons MD   REFERRING PROVIDER: Julio Sicks, MD  END OF SESSION:   PT End of Session - 08/18/22 1351     Visit Number 2    Number of Visits 16    Date for PT Re-Evaluation 10/20/22    Authorization Type [employer] [place]    PT Start Time 1352    PT Stop Time 1430    PT Time Calculation (min) 38 min    Activity Tolerance Patient tolerated treatment well             Past Medical History:  Diagnosis Date   Anxiety    takes Valium daily as needed   Basal cell carcinoma 01/21/1988   left nostril (MOHS), sup-right calf (CX35FU)   Basal cell carcinoma 03/22/1996   right calf (CX35FU)   Basal cell carcinoma 03/31/1995   left wing nose   Bruises easily    d/t meds   Cancer (HCC)    basal cell ca, in situ- uterine    Cataracts, bilateral    removed bilateral   Chronic back pain    Dizziness    r/t to meds   GERD (gastroesophageal reflux disease)    no meds on a regular basis but will take Tums if needed   Headache(784.0)    r/t neck issues   History of bronchitis 6-87yrs ago   History of colon polyps    benign   HOH (hard of hearing)    wears hearing aids   Hyperlipidemia    takes Atorvastatin on Mondays and Fridays   Hypertension    Joint pain    Joint swelling    Multiple  sclerosis (HCC)    Neuromuscular disorder (HCC)    Dr.  [facility], follows [facility]   Nocturia    PONV (postoperative nausea and vomiting)    trouble urinating after surgery in 2014   Postoperative nausea and vomiting 01/11/2019   Rheumatoid arthritis (HCC)    Dr Glo Herring weekly, RA- hands- knees- feet    Rheumatoid arthritis(714.0)    Dr Corliss Skains takes Harriette Ohara daily   Right wrist fracture    Skin cancer    Squamous cell carcinoma of skin 11/02/2001   in situ-right knee (cx66fu)   Squamous cell carcinoma of skin 11/12/2011   in situ-left forearm (CX35FU), in situ-left foot (CX35FU)   Squamous cell carcinoma of skin 01/22/2012   in situ-left lower forearm (txpbx)   Squamous cell carcinoma of skin 11/17/2012   right shin (txpbx)   Squamous cell carcinoma of skin 10/28/2013   in situ-right shoulder (CX35FU)   Squamous cell carcinoma of skin 04/28/2014   well diff-right shin (txpbx)   Squamous cell carcinoma of skin 11/02/2014   in situ-Left shin (txpbx)   Squamous cell carcinoma of skin 12/15/2014   in situ-Left hand (txpbx), bowens-left  side chest (txpbx)   Squamous cell carcinoma of skin 05/09/2015   in situ-left hand (txpbx)    Squamous cell carcinoma of skin 05/07/2016   in situ-left inner shin,ant, in situ-left inner shin, post, in situ-left outer forearm, in situ-right knuckle   Squamous cell carcinoma of skin 03/1302018   in situ-left shoulder (txpbx), in situ-right inner shin (txpbx), in situ-top of left foot (txpbx), KA- left forearm (txpbx)   Squamous cell carcinoma of skin 12/02/2016   in situ-left inner shin (txpbx)   Squamous cell carcinoma of skin 03/04/2017   in situ-left outer sup, shin (txpbx), in situ- right 2nd knuckle finger (txpbx), in situ- Left wrist (txpbx)   Squamous cell carcinoma of skin 04/06/2018   in situ-above left knee inner (txpbx)   Squamous cell carcinoma of skin 04/21/2018   in situ-right top hand (txpbx)   Squamous  cell carcinoma of skin 07/08/2018   in situ-right lower inner shin (txpbx)   Squamous cell carcinoma of skin 04/27/2019   in situ- Left neck(CX35FU), In situ- right neck (CX35FU)   Urinary retention    sees Dr.Wrenn about 2 times a yr   Past Surgical History:  Procedure Laterality Date   ABDOMINAL HYSTERECTOMY     1985   ANTERIOR CERVICAL DECOMP/DISCECTOMY FUSION N/A 04/23/2022   Procedure: Anterior Cervical Decompression/Discectomy Fusion - Cervical Seven-Thoracic One;  Surgeon: Julio Sicks, MD;  Location: [facility] OR;  Service: Neurosurgery;  Laterality: N/A;  3C   APPENDECTOMY     with TAH   BACK SURGERY     several   BREAST BIOPSY Right    Results were negative   BROW LIFT Bilateral 10/02/2016   Procedure: BILATERAL LOWER LID BLEPHAROPLASTY;  Surgeon: Floydene Flock, MD;  Location: [facility] OR;  Service: Plastics;  Laterality: Bilateral;   CARPAL TUNNEL RELEASE Right    cataracts     CERVICAL FUSION     Dr Jeral Fruit   CERVICAL FUSION  12/31/2012   Dr Jeral Fruit   CERVICAL FUSION  04/2022   CHEST TUBE INSERTION     for traumatic Pneumothorax   COLONOSCOPY  07/16/2010   normal    COLONOSCOPY W/ POLYPECTOMY  1997   negative since; Dr Arlyce Dice   ESOPHAGOGASTRODUODENOSCOPY  07/16/2010   normal   eye lid raise     EYE SURGERY Bilateral    cataracts removed - /w IOL   HARDWARE REMOVAL  08/2021   JOINT REPLACEMENT     LUMBAR FUSION     Dr Jeral Fruit   NASAL SINUS SURGERY     POSTERIOR CERVICAL FUSION/FORAMINOTOMY N/A 12/31/2012   Procedure: POSTERIOR LATERAL CERVICAL FUSION/FORAMINOTOMY LEVEL 1 CERVICAL THREE-FOUR WITH LATERAL MASS SCREWS;  Surgeon: Karn Cassis, MD;  Location: [facility];  Service: Neurosurgery;  Laterality: N/A;   PTOSIS REPAIR Bilateral 10/02/2016   Procedure: INTERNAL PTOSIS REPAIR;  Surgeon: Floydene Flock, MD;  Location: [facility] OR;  Service: Plastics;  Laterality: Bilateral;   SKIN BIOPSY     THYROID SURGERY     R lobe removed- 1972, has grown back - CT last  done- 2018   TOTAL HIP ARTHROPLASTY  12/22/2011   Procedure: TOTAL HIP ARTHROPLASTY;  Surgeon: Nestor Lewandowsky, MD;  Location: [facility] OR;  Service: Orthopedics;  Laterality: Left;   TOTAL SHOULDER ARTHROPLASTY     TUBAL LIGATION     UPPER GASTROINTESTINAL ENDOSCOPY  2012   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   Patient Active Problem List   Diagnosis Date  Noted   Cervical spondylosis with myelopathy and radiculopathy 04/23/2022   Constipation 03/04/2022   Upper back pain on left side 03/04/2022   Lumbar vertebral fracture, pathologic 09/20/2021   Degenerative spondylolisthesis 08/19/2021   Acute left lumbar radiculopathy 07/05/2021   Closed fracture of rib of right side with delayed healing 05/29/2021   Senile purpura (HCC) 12/19/2020   Restless leg 02/02/2020   Bilateral leg edema 01/11/2020   Cervical radiculopathy at C8 06/28/2019   Carpal tunnel syndrome on both sides 06/28/2019   Right sided sciatica 04/14/2018   Trochanteric bursitis of right hip 04/14/2018   H/O cervical spine surgery 11/10/2017   Dysphagia 10/26/2017   Gastroesophageal reflux disease 08/06/2017   Easy bruising 08/06/2017   Mid back pain 03/26/2017   Submandibular gland swelling 01/08/2017   High risk medication use 09/02/2016   Piriformis muscle pain 09/02/2016   Right hip pain 04/08/2016   Right knee pain 02/25/2016   Numbness and tingling in left hand 11/14/2014   Biceps tendon tear 10/10/2014   Insomnia 10/10/2014   Gait disturbance 06/13/2014   OP (osteoporosis) 01/18/2014   Other long term (current) drug therapy 01/18/2014   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 01/01/2013   Personal history of colonic polyps 07/03/2010   Nocturia 05/28/2010   THYROID NODULE 05/17/2008   Multiple sclerosis (HCC) 05/17/2008   Other fatigue 03/30/2007   Hyperlipidemia 08/20/2006   Essential hypertension 08/20/2006   Rheumatoid arthritis (HCC) 08/20/2006   CERVICAL CANCER, HX OF 08/20/2006   GASTRIC ULCER, HX  OF 08/20/2006    THERAPY DIAG:  Cervicalgia  Muscle weakness (generalized)  Difficulty in walking, not elsewhere classified  Pathological fracture of lumbar vertebra, initial encounter  REFERRING DIAG: M48.02 (ICD-10-CM) - Spinal stenosis, cervical region   Rationale for Evaluation and Treatment: Rehabilitation   ONSET DATE: chronic neck pain, recent C7-T1 fusion 04/23/22   SUBJECTIVE:                                                                                                                                                                                                          SUBJECTIVE STATEMENT: 08/18/2022 States that her leg is still sore but thinks that the pressing exercise may have caused her knee pain. States she has not been using her cane as she forgets it. States she has been in constant pain all over and is not sure that her Cymbalta is helping.   Eval: States she is still having a lot of back pain and having improved neck pain since her last cervical surgery.  States she still has popping and cracking in her neck with motion. States that she has left hand numbness that was present prior to the surgery and unsure if it will improve. States her right hand feels like her hand is asleep but she can feel it. States she drops things a lot.    States she is afraid of walking outside because she fears she will fall and her back hurts so bad. She feels safer walking indoors because she has furniture and walls to use as support. States she is against using a walker at this time.    States most of her friends are at [employer] and she is unable to see them because of her and her husbands health. States that she feels like her hip goes out and she cannot get up. (Left side)   PERTINENT HISTORY:  MS, RA, hx of skin cancer, L THA, posterior cervical fusion C3-4 2006, Anterior C4-7 not with C7-T1 fusion, osteoporosis   08/19/21 - first lumbar surgery-  L3-L4 degenerative  spondylolisthesis with stenosis and radiculopathy, status post L4-S1 decompression and fusion with instrumentation, (revision) 09/20/21: Osteoporosis related pathologic fracture of L4.  Patient status post recent L3-4 and remote L4-S1 decompression and fusion surgery. Reexploration of L3-S1 fusion with decompressive laminectomy and bilateral transpedicular decompressions at L4 -  PAIN:  Are you having pain? Yes: NPRS scale: 5/10 Pain location: all over Pain description: achy Aggravating factors: sitting Relieving factors: standing, walking house old activities    PRECAUTIONS: 10# lifting limit, limit lumbar bending and rotation    WEIGHT BEARING RESTRICTIONS: No   FALLS:  2x since last neck surgery, fear of falling- pt reports none but reports 2x her hip "went out" and her butt ended up on the floor   LIVING ENVIRONMENT: Lives with: lives with their family     OCCUPATION: not currently working    PLOF: Independent   PATIENT GOALS: to be able to walk around the house and do house work. - currently not able to do housework per MD or abilities        OBJECTIVE:    DIAGNOSTIC FINDINGS:  Recent neck imaging postoperatively of cervical spine demonstrating posterior and anterior fusions     COGNITION: Overall cognitive status: Within functional limits for tasks assessed   SENSATION: Numb in left hand and tingling in right hand-not formally assessed   POSTURE: rounded shoulders and forward head       CERVICAL ROM:    Active ROM A/PROM (deg) eval  Flexion 100% limited  Extension 100% limited- reports dizziness with it  Right lateral flexion    Left lateral flexion    Right rotation 60% limited  Left rotation 80%limited *   (Blank rows = not tested) *pain  Per visual estimation                  UE Measurements will assess in future sessions       Upper Extremity Right 07/28/2022 Left 07/28/2022    A/PROM MMT A/PROM MMT  Shoulder Flexion          Shoulder Extension           Shoulder Abduction          Shoulder Adduction          Shoulder Internal Rotation          Shoulder External Rotation          Elbow Flexion  Elbow Extension          Wrist Flexion          Wrist Extension          Wrist Supination          Wrist Pronation          Wrist Ulnar Deviation          Wrist Radial Deviation          Grip Strength NA   NA                          (Blank rows = not tested)                       * pain         FUNCTIONAL TESTS: 07/28/22 2 minute walk test: 160 feet- then increased back pain and severe limp  Dynamic Gait Index:     [facility] PT Assessment - 07/28/22 0001                Standardized Balance Assessment    Standardized Balance Assessment Dynamic Gait Index          Dynamic Gait Index    Level Surface Mild Impairment     Change in Gait Speed Mild Impairment     Gait with Horizontal Head Turns Moderate Impairment   trunk rotation    Gait with Vertical Head Turns Severe Impairment   trunk motion    Gait and Pivot Turn Normal     Step Over Obstacle Normal     Step Around Obstacles Normal     Steps Mild Impairment     Total Score 16     DGI comment: not continuous with breaks between 2 tasks               Sit to stand-use of left hand and slight loss of balance towards the left upon standing   TODAY'S TREATMENT:                                                                                                                              DATE:   08/18/2022    Therapeutic Exercise:            Aerobic:walking no cane then with cane - education on importance and benefits fo use of cane Supine: Prone:            Seated: Hip adduction isometric ball -reviewed            Standing: Neuromuscular Re-education: Manual Therapy: Gait training  Self Care: Trigger Point Dry Needling:  Modalities:    PATIENT EDUCATION:  Education on HEP, on following up with MD about chronic pain, desire to change medications and importance of  following up with MD about any medication changes. On importance of using cane to reduce pain/fall risk.  Person educated: Patient  Education method: Explanation, Demonstration, and Handouts Education comprehension: verbalized understanding   HOME EXERCISE PROGRAM: [id number]   ASSESSMENT:   CLINICAL IMPRESSION: 08/18/2022 Session focused on answering all questions about recent bout of knee pain and how it related to hip adduction isometric.  Patient with concerns as pain levels have been high and any repetitious motion has been increasing her pain.  Discussed likelihood of poor tolerance to any intervention of therapeutic exercise at this time and importance of following up with MD about medication concerns and questions.  Educated and reiterated importance of use of cane while out in the community secondary to high fall risk, pain, and mobility issues.  Patient verbalized understanding reports she forgets her cane in the car all the time.  Patient placed on hold from PT to follow-up with MD secondary to high pain levels and poor tolerance to PT at this time.  Eval: Patient presents to physical therapy after recent neck fusion and revision on April 23, 2022.  Patient reports difficulty walking for greater than a minute and reports minimal pain in her neck at this time but mostly pain in her lumbar spine and weakness in left leg.  Patient also has history of multiple lumbar surgeries/revisions.  Patient also with history of falling and weakness in bilateral lower extremities contributing to current presentation.  Patient's primary goal at this time is to be able to walk for longer periods of time and would like to return to golf.  Discussed importance of discussing goals with MD as well as strategies to reduce repetitious bending and twisting at the lumbar spine at home as she is currently trying to do multiple home care tasks that she should not be doing at this time.  Patient would greatly benefit  from skilled physical therapy to improve overall all function and reduce risk of falling.   OBJECTIVE IMPAIRMENTS: decreased activity tolerance, decreased balance, decreased endurance, decreased knowledge of use of DME, decreased mobility, difficulty walking, decreased ROM, decreased strength, impaired UE functional use, postural dysfunction, and pain.    ACTIVITY LIMITATIONS: carrying, lifting, bending, standing, squatting, stairs, transfers, and locomotion level   PARTICIPATION LIMITATIONS: meal prep, cleaning, laundry, driving, and community activity   PERSONAL FACTORS: Age, Fitness, Time since onset of injury/illness/exacerbation, and 3+ comorbidities: Osteoporosis, multiple lumbar surgeries, multiple neck surgeries, history of falling  are also affecting patient's functional outcome.    REHAB POTENTIAL: Good   CLINICAL DECISION MAKING: Evolving/moderate complexity   EVALUATION COMPLEXITY: Moderate     GOALS: Goals reviewed with patient? yes   SHORT TERM GOALS: Target date: 09/08/2022  Patient will be independent in self management strategies to improve quality of life and functional outcomes. Baseline: New Program Goal status: INITIAL   2.  Patient will report at least 25% improvement in overall symptoms and/or function to demonstrate improved functional mobility Baseline: 0% better Goal status: INITIAL   3.  Patient will report using cane at all times outside of home Baseline: Not currently doing Goal status: INITIAL         LONG TERM GOALS: Target date: 10/20/2022    Patient will report at least 75% improvement in overall symptoms and/or function to demonstrate improved functional mobility Baseline: 0% better Goal status: INITIAL   2.  Patient will be able to walk with or without cane for at least 5 minutes without leg giving out severe pain in low back to demonstrate improved walking endurance Baseline: Unable Goal status: INITIAL   3.  Patient will score  with at  least 19 out of 24 on DGI to demonstrate reduced fall risk Baseline: 16 out of 24 Goal status: INITIAL   4.  Patient will report no instances where her hip gave out and her but ended up on the floor(no falls) over 3 weeks.  To demonstrate reduced falling Baseline: Currently falling Goal status: INITIAL       PLAN:   PT FREQUENCY: 1-2x/week 16 visits over 12 weeks.   PT DURATION: 12 weeks   PLANNED INTERVENTIONS: Therapeutic exercises, Therapeutic activity, Neuromuscular re-education, Balance training, Gait training, Patient/Family education, Self Care, Joint mobilization, Vestibular training, Canalith repositioning, Orthotic/Fit training, Aquatic Therapy, Dry Needling, Electrical stimulation, Cryotherapy, Moist heat, Traction, Ultrasound, Ionotophoresis 4mg /ml Dexamethasone, Manual therapy, and Re-evaluation   PLAN FOR NEXT SESSION: Follow-up with cane use, lower extremity strength, walking endurance, upper extremity strength, neck range of motion as able and rotational direction     2:51 PM, 08/18/22 Tereasa Coop, DPT Physical Therapy with [facility]

## 2022-08-19 DIAGNOSIS — M4310 Spondylolisthesis, site unspecified: Secondary | ICD-10-CM | POA: Diagnosis not present

## 2022-08-19 MED ORDER — TRAMADOL HCL 50 MG PO TABS: 50.0000 mg | ORAL_TABLET | Freq: Three times a day (TID) | ORAL | 0 refills | Status: DC | PRN

## 2022-08-21 ENCOUNTER — Encounter: Payer: Medicare Other | Admitting: Physical Therapy

## 2022-08-21 NOTE — Progress Notes (Signed)
Zach Addisynn Vassell Custer [address] [place] Phone: [phone] Subjective:   IVilma Meckel, am serving as a scribe for Dr. Hulan Saas.  I'm seeing this patient by the request  of:  Binnie Rail, MD  CC: Neck pain with radiation follow-up  [id number]  06/19/2022 Still not fully released from patient's surgery.  This was the anterior aspect of the cervical radiculopathy. Still has cervical radiculopathy on the C8 distribution.  Increase gabapentin to up to 400 mg at night, continue Cymbalta 20 mg.  Worsening pain may need to consider other things but I do feel that for her well physical therapy will be the most helpful.  Will wait though until released from neurosurgery. Trazadone at night also rx warned of side effects  Updated 08/28/2022 [place] A Hodum is a 82 y.o. female coming in with complaint of neck pain.  Patient was making some progress after the surgical intervention of the cervical spine.  Has started with physical therapy March 25.  Patient was to continue on Cymbalta.      Past Medical History:  Diagnosis Date   Anxiety    takes Valium daily as needed   Basal cell carcinoma 01/21/1988   left nostril (MOHS), sup-right calf (CX35FU)   Basal cell carcinoma 03/22/1996   right calf (CX35FU)   Basal cell carcinoma 03/31/1995   left wing nose   Bruises easily    d/t meds   Cancer    basal cell ca, in situ- uterine    Cataracts, bilateral    removed bilateral   Chronic back pain    Dizziness    r/t to meds   GERD (gastroesophageal reflux disease)    no meds on a regular basis but will take Tums if needed   Headache(784.0)    r/t neck issues   History of bronchitis 6-30yrs ago   History of colon polyps    benign   HOH (hard of hearing)    wears hearing aids   Hyperlipidemia    takes Atorvastatin on Mondays and Fridays   Hypertension    Joint pain    Joint swelling    Multiple sclerosis    Neuromuscular disorder     Dr. Park Liter- [facility], follows M.[facility].   Nocturia    PONV (postoperative nausea and vomiting)    trouble urinating after surgery in 2014   Postoperative nausea and vomiting 01/11/2019   Rheumatoid arthritis    Dr Jani Gravel weekly, RA- hands- knees- feet    Rheumatoid arthritis(714.0)    Dr Estanislado Pandy takes [name] daily   Right wrist fracture    Skin cancer    Squamous cell carcinoma of skin 11/02/2001   in situ-right knee (cx73fu)   Squamous cell carcinoma of skin 11/12/2011   in situ-left forearm (CX35FU), in situ-left foot (CX35FU)   Squamous cell carcinoma of skin 01/22/2012   in situ-left lower forearm (txpbx)   Squamous cell carcinoma of skin 11/17/2012   right shin (txpbx)   Squamous cell carcinoma of skin 10/28/2013   in situ-right shoulder (CX35FU)   Squamous cell carcinoma of skin 04/28/2014   well diff-right shin (txpbx)   Squamous cell carcinoma of skin 11/02/2014   in situ-Left shin (txpbx)   Squamous cell carcinoma of skin 12/15/2014   in situ-Left hand (txpbx), bowens-left side chest (txpbx)   Squamous cell carcinoma of skin 05/09/2015   in situ-left hand (txpbx)    Squamous cell carcinoma of skin 05/07/2016  in situ-left inner shin,ant, in situ-left inner shin, post, in situ-left outer forearm, in situ-right knuckle   Squamous cell carcinoma of skin 03/1302018   in situ-left shoulder (txpbx), in situ-right inner shin (txpbx), in situ-top of left foot (txpbx), KA- left forearm (txpbx)   Squamous cell carcinoma of skin 12/02/2016   in situ-left inner shin (txpbx)   Squamous cell carcinoma of skin 03/04/2017   in situ-left outer sup, shin (txpbx), in situ- right 2nd knuckle finger (txpbx), in situ- Left wrist (txpbx)   Squamous cell carcinoma of skin 04/06/2018   in situ-above left knee inner (txpbx)   Squamous cell carcinoma of skin 04/21/2018   in situ-right top hand (txpbx)   Squamous cell carcinoma of skin 07/08/2018   in situ-right  lower inner shin (txpbx)   Squamous cell carcinoma of skin 04/27/2019   in situ- Left neck(CX35FU), In situ- right neck (CX35FU)   Urinary retention    sees Dr.Wrenn about 2 times a yr   Past Surgical History:  Procedure Laterality Date   ABDOMINAL HYSTERECTOMY     1985   ANTERIOR CERVICAL DECOMP/DISCECTOMY FUSION N/A 04/23/2022   Procedure: Anterior Cervical Decompression/Discectomy Fusion - Cervical Seven-Thoracic One;  Surgeon: Earnie Larsson, MD;  Location: [facility];  Service: Neurosurgery;  Laterality: N/A;  3C   APPENDECTOMY     with TAH   BACK SURGERY     several   BREAST BIOPSY Right    Results were negative   BROW LIFT Bilateral 10/02/2016   Procedure: BILATERAL LOWER LID BLEPHAROPLASTY;  Surgeon: Clista Bernhardt, MD;  Location: [facility];  Service: Plastics;  Laterality: Bilateral;   CARPAL TUNNEL RELEASE Right    cataracts     CERVICAL FUSION     Dr Joya Salm   CERVICAL FUSION  12/31/2012   Dr Joya Salm   CERVICAL FUSION  04/2022   CHEST TUBE INSERTION     for traumatic Pneumothorax   COLONOSCOPY  07/16/2010   normal    COLONOSCOPY W/ POLYPECTOMY  1997   negative since; Dr Deatra Ina   ESOPHAGOGASTRODUODENOSCOPY  07/16/2010   normal   eye lid raise     EYE SURGERY Bilateral    cataracts removed - /w IOL   HARDWARE REMOVAL  08/2021   JOINT REPLACEMENT     LUMBAR FUSION     Dr Joya Salm   NASAL SINUS SURGERY     POSTERIOR CERVICAL FUSION/FORAMINOTOMY N/A 12/31/2012   Procedure: POSTERIOR LATERAL CERVICAL FUSION/FORAMINOTOMY LEVEL 1 CERVICAL THREE-FOUR WITH LATERAL MASS SCREWS;  Surgeon: Floyce Stakes, MD;  Location: [facility];  Service: Neurosurgery;  Laterality: N/A;   PTOSIS REPAIR Bilateral 10/02/2016   Procedure: INTERNAL PTOSIS REPAIR;  Surgeon: Clista Bernhardt, MD;  Location: [facility];  Service: Plastics;  Laterality: Bilateral;   SKIN BIOPSY     THYROID SURGERY     R lobe removed- 1972, has grown back - CT last done- 2018   TOTAL HIP ARTHROPLASTY  12/22/2011    Procedure: TOTAL HIP ARTHROPLASTY;  Surgeon: Kerin Salen, MD;  Location: [facility];  Service: Orthopedics;  Laterality: Left;   TOTAL SHOULDER ARTHROPLASTY     TUBAL LIGATION     UPPER GASTROINTESTINAL ENDOSCOPY  2012   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   Social History   Socioeconomic History   Marital status: Married    Spouse name: Quita Skye   Number of children: 2   Years of education: Not on file   Highest education level:  Not on file  Occupational History   Occupation: retired  Tobacco Use   Smoking status: Former    Packs/day: 2.50    Years: 20.00    Additional pack years: 0.00    Total pack years: 50.00    Types: Cigarettes    Quit date: 05/26/1981    Years since quitting: 41.2    Passive exposure: Never   Smokeless tobacco: Never   Tobacco comments:    Smoked [phone], up to 2.5 ppd  Vaping Use   Vaping Use: Never used  Substance and Sexual Activity   Alcohol use: Yes    Alcohol/week: 14.0 standard drinks of alcohol    Types: 14 Cans of beer per week    Comment: 2% beer   Drug use: Never   Sexual activity: Yes    Birth control/protection: Surgical    Comment: Hysterectomy  Other Topics Concern   Not on file  Social History Narrative   Not on file   Social Determinants of Health   Financial Resource Strain: Low Risk  (02/21/2022)   Overall Financial Resource Strain (CARDIA)    Difficulty of Paying Living Expenses: Not hard at all  Food Insecurity: No Food Insecurity (04/25/2022)   Hunger Vital Sign    Worried About Running Out of Food in the Last Year: Never true    Ran Out of Food in the Last Year: Never true  Transportation Needs: No Transportation Needs (04/25/2022)   PRAPARE - Hydrologist (Medical): No    Lack of Transportation (Non-Medical): No  Physical Activity: Inactive (02/21/2022)   Exercise Vital Sign    Days of Exercise per Week: 0 days    Minutes of Exercise per Session: 0 min  Stress: Stress Concern Present  (02/21/2022)   [place]    Feeling of Stress : Rather much  Social Connections: Moderately Isolated (02/21/2022)   Social Connection and Isolation Panel [NHANES]    Frequency of Communication with Friends and Family: More than three times a week    Frequency of Social Gatherings with Friends and Family: Twice a week    Attends Religious Services: Never    Marine scientist or Organizations: No    Attends Archivist Meetings: Never    Marital Status: Married   Allergies  Allergen Reactions   Darvon [Propoxyphene Hcl] Shortness Of Breath    ? Dose Related ? Lowered respirations greatly   Demerol [Meperidine] Shortness Of Breath and Other (See Comments)    Respiratory Distress   Penicillins Hives and Other (See Comments)    Has patient had a PCN reaction causing immediate rash, facial/tongue/throat swelling, SOB or lightheadedness with hypotension: No SEVERE RASH INVOLVING MUCUS MEMBRANES or SKIN NECROSIS: #  #  #  YES  #  #  #  Has patient had a PCN reaction that required hospitalization No Has patient had a PCN reaction occurring within the last 10 years: No.    Imuran [Azathioprine] Other (See Comments)    Weakness   Sulfa Antibiotics Nausea Only and Other (See Comments)    States had severe indigestion and heartburn and nausea and had to stop taking   Arava [Leflunomide] Other (See Comments)    Excessive weight gain   Lipitor [Atorvastatin] Nausea And Vomiting    Currently taking 2 times weekly - able to tolerate low dose    Vytorin [Ezetimibe-Simvastatin] Nausea And Vomiting   Family History  Problem Relation Age of Onset   Cancer Mother        pancreatic   Diabetes Mother    Pancreatic cancer Mother    Heart disease Father        Rheumatic   Cancer Father        ? stomach   Stomach cancer Father    Cancer Sister        stomach   Stomach cancer Sister    Cancer Maternal Aunt        X  4; ? primary   Heart disease Paternal Aunt    Cancer Maternal Grandmother        cervical   Colon cancer Other        Aunts   Multiple sclerosis Daughter    Hypertension Neg Hx    Stroke Neg Hx    Esophageal cancer Neg Hx    Rectal cancer Neg Hx     Current Outpatient Medications (Endocrine & Metabolic):    denosumab (PROLIA) 60 MG/ML SOSY injection, Inject 60 mg into the skin every 6 (six) months. Deliver to rheum: [address] [place], [place], [place] [address]. Appt on 07/23/2022   predniSONE (DELTASONE) 5 MG tablet, TAKE 1 TABLET BY MOUTH EVERY DAY IN THE MORNING  Current Outpatient Medications (Cardiovascular):    atorvastatin (LIPITOR) 10 MG tablet, TAKE 1 TAB ON MONDAYS AND FRIDAY   carvedilol (COREG) 3.125 MG tablet, Take 1 tablet (3.125 mg total) by mouth 2 (two) times daily with a meal.   furosemide (LASIX) 20 MG tablet, TAKE 1 TABLET BY MOUTH EVERY DAY   Current Outpatient Medications (Analgesics):    traMADol (ULTRAM) 50 MG tablet, Take 1 tablet (50 mg total) by mouth 3 (three) times daily as needed for severe pain.   Upadacitinib ER ([name]) 15 MG TB24, Take 15 mg by mouth daily.  Current Outpatient Medications (Hematological):    folic acid (FOLVITE) 1 MG tablet, Take 1 tablet (1 mg total) by mouth daily.  Current Outpatient Medications (Other):    Biotin 5000 MCG TABS, Take 10,000 mcg by mouth every evening.   Carboxymethylcellul-Glycerin (LUBRICATING EYE DROPS OP), Place 1 drop into both eyes daily as needed (dry eyes).   cholecalciferol (VITAMIN D) 25 MCG (1000 UNIT) tablet, Take 1,000 Units by mouth in the morning.   cyclobenzaprine (FLEXERIL) 10 MG tablet, Take 1 tablet (10 mg total) by mouth 3 (three) times daily as needed for muscle spasms.   diclofenac sodium (VOLTAREN) 1 % GEL, APPLY 3 GRAMS TO 3 LARGE JOINTS 3 TIMES A DAY AS NEEDED   DULoxetine (CYMBALTA) 20 MG capsule, Take 2 capsules (40 mg total) by mouth daily.   famotidine (PEPCID) 10 MG tablet, Take  10 mg by mouth in the morning.   linaclotide (LINZESS) 290 MCG CAPS capsule, Take 1 capsule (290 mcg total) by mouth daily before breakfast.   methotrexate (RHEUMATREX) 2.5 MG tablet, TAKE 6 TABLETS (15 MG TOTAL) BY MOUTH ONCE A WEEK. CAUTION:CHEMOTHERAPY. PROTECT FROM LIGHT.   polyethylene glycol (MIRALAX / GLYCOLAX) 17 g packet, Take 17 g by mouth every other day. In the morning.   TUBERCULIN SYR 1CC/27GX1/2" 27G X 1/2" 1 ML MISC, Use 1 syringe once weekly to inject methotrexate.   Reviewed prior external information including notes and imaging from  primary care provider As well as notes that were available from care everywhere and other healthcare systems.  Reviewed notes from rheumatology recently for patient's underlying rheumatoid arthritis or patient did not  have any medical changes or medication changes.  Past medical history, social, surgical and family history all reviewed in electronic medical record.  No pertanent information unless stated regarding to the chief complaint.   Review of Systems:  No headache, visual changes, nausea, vomiting, diarrhea, constipation, dizziness, abdominal pain, skin rash, fevers, chills, night sweats, weight loss, swollen lymph nodes, , joint swelling, chest pain, shortness of breath, mood changes. POSITIVE muscle aches, body aches  Objective  Blood pressure (!) 140/90, pulse 68, height 5' (1.524 m), weight 118 lb (53.5 kg), SpO2 93 %.   General: No apparent distress alert and oriented x3 mood and affect normal, dressed appropriately.  HEENT: Pupils equal, extraocular movements intact  Respiratory: Patient's speak in full sentences and does not appear short of breath  Cardiovascular: No lower extremity edema, non tender, no erythema  Patient is sitting much significantly more comfortably.  Still has some atrophy of the left hand noted.  Patient noted does have improvement though of the strength in the C8 distribution in the C6.  Fairly significant  improvement from previous exam.     Impression and Recommendations:     The above documentation has been reviewed and is accurate and complete Lyndal Pulley, DO

## 2022-08-25 ENCOUNTER — Encounter: Payer: Medicare Other | Admitting: Physical Therapy

## 2022-08-25 NOTE — Progress Notes (Unsigned)
08/26/2022 Sherri [name] ZP:[id number] 01-Dec-1940  Referring provider: Binnie Rail, MD Primary GI doctor: Dr. Fuller Plan  ASSESSMENT AND PLAN:   82 year old female with history of MS, urinary retention, recent lumbar surgery x 2 and cervical surgery last year presents with worsening constipation and lower abdominal discomfort. 2022 colonoscopy 3 diminutive polyps removed no recall due to age, internal hemorrhoids On examination negative Hemoccult, positive soft large volume stool and decreased rectal tone. Reassured patient this is not a colon or rectal cancer. Most likely this is a pelvic floor dysfunction from either recent surgeries, decreased movement or progression of MS. With lower abdominal discomfort, urinary and stool retention, will get CT abdomen pelvis with contrast since this is never been evaluated to rule out other etiology. Otherwise consider repeating pelvic floor physical therapy, completed 2022. Following up with neurology. Add on Senokot at night 3 times a week or nightly. Follow-up 2 months.    Patient Care Team: Binnie Rail, MD as PCP - General (Internal Medicine) Sater, Nanine Means, MD (Neurology) Bo Merino, MD as Consulting Physician (Rheumatology) Charlton Haws, [facility] as Pharmacist (Pharmacist) Warren Danes, PA-C as Physician Assistant (Dermatology)  HISTORY OF PRESENT ILLNESS: 81 y.o. female with a past medical history of personal history of colon polyps, GERD, hypertension, hyperlipidemia, multiple sclerosis with urinary retention follows with neurology, rheumatoid arthritis follows with Dr. Neoma Laming sure, history of squamous and basal cell carcinoma and others listed below presents for evaluation of constipation.   09/17/2020 EGD Dr. Fuller Plan for GERD showed LA grade a esophagitis, white exudates mid esophagus, erythematous mucosa prepyloric region, small hiatal hernia, normal duodenum negative H. pylori, reactive gastropathy, candidal  esophagitis, treated Diflucan 100 mg daily for 7 days. 09/17/2020 colonoscopy Dr. Fuller Plan change in bowel habits showed 3 mm polyp cecum, 3 polyps 5 to 8 mm transverse colon, mild diverticulosis no recall due to age.  Tubular adenomatous polyps  She states last year in 08/19/21 she had lumbar surgery, took our old hardware and put in new hardware. She states the hardware failed and she had repeat lumbar surgery in 09/20/2021, and she had cervical surgery 04/23/2022.  She states her constipation has gotten worse since April. She is having BM every other day even with taking linzess 290, colace, probiotics.  She states her stools are size of pencil, never complete BM, she strains hard but it is soft stools.  No melena, no hematochezia.  She has urinary retention, she has rare fecal smearing/incontinence.    She  reports that she quit smoking about 41 years ago. Her smoking use included cigarettes. She has a 50.00 pack-year smoking history. She has never been exposed to tobacco smoke. She has never used smokeless tobacco. She reports current alcohol use of about 14.0 standard drinks of alcohol per week. She reports that she does not use drugs.  RELEVANT LABS AND IMAGING: CBC    Component Value Date/Time   WBC 4.6 07/17/2022 1459   RBC 3.48 (L) 07/17/2022 1459   HGB 12.0 07/17/2022 1459   HGB 13.0 06/20/2019 1335   HCT 34.5 (L) 07/17/2022 1459   HCT 38.0 06/20/2019 1335   PLT 323 07/17/2022 1459   PLT 363 06/20/2019 1335   MCV 99.1 07/17/2022 1459   MCV 100 (H) 06/20/2019 1335   MCH 34.5 (H) 07/17/2022 1459   MCHC 34.8 07/17/2022 1459   RDW 14.7 07/17/2022 1459   RDW 13.4 06/20/2019 1335   LYMPHSABS 998 07/17/2022 1459   LYMPHSABS 0.6 (  L) 06/20/2019 1335   MONOABS 0.7 03/30/2021 2317   EOSABS 9 (L) 07/17/2022 1459   EOSABS 0.0 06/20/2019 1335   BASOSABS 18 07/17/2022 1459   BASOSABS 0.0 06/20/2019 1335   Recent Labs    09/20/21 1341 12/12/21 1424 03/05/22 1429 04/16/22 1400  05/14/22 1327 07/17/22 1459  HGB 13.6 12.7 11.6* 12.5 12.4 12.0     CMP     Component Value Date/Time   NA 136 07/17/2022 1459   NA 139 06/20/2019 1335   NA 136 02/22/2015 0000   K 5.1 07/17/2022 1459   K 4.4 02/22/2015 0000   CL 99 07/17/2022 1459   CL 98 02/22/2015 0000   CO2 29 07/17/2022 1459   CO2 30 02/22/2015 0000   GLUCOSE 90 07/17/2022 1459   BUN 13 07/17/2022 1459   BUN 10 06/20/2019 1335   CREATININE 0.88 07/17/2022 1459   CALCIUM 9.4 07/17/2022 1459   CALCIUM 9.6 02/22/2015 0000   PROT 6.6 07/17/2022 1459   PROT 6.4 06/20/2019 1335   PROT 6.6 02/22/2015 0000   ALBUMIN 4.2 04/10/2020 0914   ALBUMIN 4.2 06/20/2019 1335   ALBUMIN 4.3 02/22/2015 0000   AST 29 07/17/2022 1459   AST 24 02/22/2015 0000   ALT 18 07/17/2022 1459   ALT 18 02/22/2015 0000   ALKPHOS 29 (L) 04/10/2020 0914   ALKPHOS 34 02/22/2015 0000   BILITOT 0.5 07/17/2022 1459   BILITOT 0.3 06/20/2019 1335   GFRNONAA >60 04/16/2022 1400   GFRNONAA 56 (L) 09/26/2020 1536   GFRAA 65 09/26/2020 1536      Latest Ref Rng & Units 07/17/2022    2:59 PM 05/14/2022    1:27 PM 03/05/2022    2:29 PM  Hepatic Function  Total Protein 6.1 - 8.1 g/dL 6.6  6.5  6.3   AST 10 - 35 U/L 29  28  27    ALT 6 - 29 U/L 18  18  16    Total Bilirubin 0.2 - 1.2 mg/dL 0.5  0.4  0.5       Current Medications:   Current Outpatient Medications (Endocrine & Metabolic):    denosumab (PROLIA) 60 MG/ML SOSY injection, Inject 60 mg into the skin every 6 (six) months. Deliver to rheum: [address] [place], [place], [place] [address]. Appt on 07/23/2022   predniSONE (DELTASONE) 5 MG tablet, TAKE 1 TABLET BY MOUTH EVERY DAY IN THE MORNING  Current Outpatient Medications (Cardiovascular):    atorvastatin (LIPITOR) 10 MG tablet, TAKE 1 TAB ON MONDAYS AND FRIDAY   carvedilol (COREG) 3.125 MG tablet, Take 1 tablet (3.125 mg total) by mouth 2 (two) times daily with a meal.   furosemide (LASIX) 20 MG tablet, TAKE 1 TABLET BY  MOUTH EVERY DAY   Current Outpatient Medications (Analgesics):    traMADol (ULTRAM) 50 MG tablet, Take 1 tablet (50 mg total) by mouth 3 (three) times daily as needed for severe pain.   Upadacitinib ER ([name]) 15 MG TB24, Take 15 mg by mouth daily.  Current Outpatient Medications (Hematological):    folic acid (FOLVITE) 1 MG tablet, Take 1 tablet (1 mg total) by mouth daily.  Current Outpatient Medications (Other):    Biotin 5000 MCG TABS, Take 10,000 mcg by mouth every evening.   Carboxymethylcellul-Glycerin (LUBRICATING EYE DROPS OP), Place 1 drop into both eyes daily as needed (dry eyes).   cholecalciferol (VITAMIN D) 25 MCG (1000 UNIT) tablet, Take 1,000 Units by mouth in the morning.   cyclobenzaprine (FLEXERIL) 10 MG  tablet, Take 1 tablet (10 mg total) by mouth 3 (three) times daily as needed for muscle spasms.   diclofenac sodium (VOLTAREN) 1 % GEL, APPLY 3 GRAMS TO 3 LARGE JOINTS 3 TIMES A DAY AS NEEDED   DULoxetine (CYMBALTA) 20 MG capsule, Take 2 capsules (40 mg total) by mouth daily.   famotidine (PEPCID) 10 MG tablet, Take 10 mg by mouth in the morning.   linaclotide (LINZESS) 290 MCG CAPS capsule, Take 1 capsule (290 mcg total) by mouth daily before breakfast.   methotrexate (RHEUMATREX) 2.5 MG tablet, TAKE 6 TABLETS (15 MG TOTAL) BY MOUTH ONCE A WEEK. CAUTION:CHEMOTHERAPY. PROTECT FROM LIGHT.   polyethylene glycol (MIRALAX / GLYCOLAX) 17 g packet, Take 17 g by mouth every other day. In the morning.   TUBERCULIN SYR 1CC/27GX1/2" 27G X 1/2" 1 ML MISC, Use 1 syringe once weekly to inject methotrexate.  Medical History:  Past Medical History:  Diagnosis Date   Anxiety    takes Valium daily as needed   Basal cell carcinoma 01/21/1988   left nostril (MOHS), sup-right calf (CX35FU)   Basal cell carcinoma 03/22/1996   right calf (CX35FU)   Basal cell carcinoma 03/31/1995   left wing nose   Bruises easily    d/t meds   Cancer    basal cell ca, in situ- uterine     Cataracts, bilateral    removed bilateral   Chronic back pain    Dizziness    r/t to meds   GERD (gastroesophageal reflux disease)    no meds on a regular basis but will take Tums if needed   Headache(784.0)    r/t neck issues   History of bronchitis 6-81yrs ago   History of colon polyps    benign   HOH (hard of hearing)    wears hearing aids   Hyperlipidemia    takes Atorvastatin on Mondays and Fridays   Hypertension    Joint pain    Joint swelling    Multiple sclerosis    Neuromuscular disorder    Dr. Park Liter- [facility], follows [facility]   Nocturia    PONV (postoperative nausea and vomiting)    trouble urinating after surgery in 2014   Postoperative nausea and vomiting 01/11/2019   Rheumatoid arthritis    Dr Jani Gravel weekly, RA- hands- knees- feet    Rheumatoid arthritis(714.0)    Dr Estanislado Pandy takes [name] daily   Right wrist fracture    Skin cancer    Squamous cell carcinoma of skin 11/02/2001   in situ-right knee (cx64fu)   Squamous cell carcinoma of skin 11/12/2011   in situ-left forearm (CX35FU), in situ-left foot (CX35FU)   Squamous cell carcinoma of skin 01/22/2012   in situ-left lower forearm (txpbx)   Squamous cell carcinoma of skin 11/17/2012   right shin (txpbx)   Squamous cell carcinoma of skin 10/28/2013   in situ-right shoulder (CX35FU)   Squamous cell carcinoma of skin 04/28/2014   well diff-right shin (txpbx)   Squamous cell carcinoma of skin 11/02/2014   in situ-Left shin (txpbx)   Squamous cell carcinoma of skin 12/15/2014   in situ-Left hand (txpbx), bowens-left side chest (txpbx)   Squamous cell carcinoma of skin 05/09/2015   in situ-left hand (txpbx)    Squamous cell carcinoma of skin 05/07/2016   in situ-left inner shin,ant, in situ-left inner shin, post, in situ-left outer forearm, in situ-right knuckle   Squamous cell carcinoma of skin 03/1302018   in situ-left shoulder (txpbx), in  situ-right inner shin (txpbx), in situ-top  of left foot (txpbx), KA- left forearm (txpbx)   Squamous cell carcinoma of skin 12/02/2016   in situ-left inner shin (txpbx)   Squamous cell carcinoma of skin 03/04/2017   in situ-left outer sup, shin (txpbx), in situ- right 2nd knuckle finger (txpbx), in situ- Left wrist (txpbx)   Squamous cell carcinoma of skin 04/06/2018   in situ-above left knee inner (txpbx)   Squamous cell carcinoma of skin 04/21/2018   in situ-right top hand (txpbx)   Squamous cell carcinoma of skin 07/08/2018   in situ-right lower inner shin (txpbx)   Squamous cell carcinoma of skin 04/27/2019   in situ- Left neck(CX35FU), In situ- right neck (CX35FU)   Urinary retention    sees Dr.Wrenn about 2 times a yr   Allergies:  Allergies  Allergen Reactions   Darvon [Propoxyphene Hcl] Shortness Of Breath    ? Dose Related ? Lowered respirations greatly   Demerol [Meperidine] Shortness Of Breath and Other (See Comments)    Respiratory Distress   Penicillins Hives and Other (See Comments)    Has patient had a PCN reaction causing immediate rash, facial/tongue/throat swelling, SOB or lightheadedness with hypotension: No SEVERE RASH INVOLVING MUCUS MEMBRANES or SKIN NECROSIS: #  #  #  YES  #  #  #  Has patient had a PCN reaction that required hospitalization No Has patient had a PCN reaction occurring within the last 10 years: No.    Imuran [Azathioprine] Other (See Comments)    Weakness   Sulfa Antibiotics Nausea Only and Other (See Comments)    States had severe indigestion and heartburn and nausea and had to stop taking   Arava [Leflunomide] Other (See Comments)    Excessive weight gain   Lipitor [Atorvastatin] Nausea And Vomiting    Currently taking 2 times weekly - able to tolerate low dose    Vytorin [Ezetimibe-Simvastatin] Nausea And Vomiting     Surgical History:  She  has a past surgical history that includes Lumbar fusion; Appendectomy; Cervical fusion; Carpal tunnel release (Right); Chest tube  insertion; Colonoscopy w/ polypectomy (1997); Thyroid surgery; Abdominal hysterectomy; Nasal sinus surgery; Total hip arthroplasty (12/22/2011); Cervical fusion (12/31/2012); Posterior cervical fusion/foraminotomy (N/A, 12/31/2012); Colonoscopy (07/16/2010); Esophagogastroduodenoscopy (07/16/2010); Skin biopsy; Back surgery; Eye surgery (Bilateral); Tubal ligation; Ptosis repair (Bilateral, 10/02/2016); Brow lift (Bilateral, 10/02/2016); cataracts; eye lid raise; Joint replacement; Total shoulder arthroplasty; Breast biopsy (Right); Upper gastrointestinal endoscopy (2012); Anterior cervical decomp/discectomy fusion (N/A, 04/23/2022); Hardware Removal (08/2021); and Cervical fusion (04/2022). Family History:  Her family history includes Cancer in her father, maternal aunt, maternal grandmother, mother, and sister; Colon cancer in an other family member; Diabetes in her mother; Heart disease in her father and paternal aunt; Multiple sclerosis in her daughter; Pancreatic cancer in her mother; Stomach cancer in her father and sister.  REVIEW OF SYSTEMS  : All other systems reviewed and negative except where noted in the History of Present Illness.  PHYSICAL EXAM: BP 122/68   Pulse 68   Ht 5' (1.524 m)   Wt 118 lb (53.5 kg)   BMI 23.05 kg/m  General Appearance: Well nourished, in no apparent distress. Head:   Normocephalic and atraumatic. Eyes:  sclerae anicteric,conjunctive pink  Respiratory: Respiratory effort normal, BS equal bilaterally without rales, rhonchi, wheezing. Cardio: RRR with no MRGs. Peripheral pulses intact.  Abdomen: Soft,  Non-distended ,active bowel sounds. mild tenderness in the LLQ. Without guarding and Without rebound. No masses. Rectal: Normal external  rectal exam, decreased rectal tone, appreciated internal hemorrhoids, non-tender, no masses, large volume SOFT brown stool, hemoccult Negative Musculoskeletal: Full ROM, antalgic/stiff gait. Without edema. Skin:  Dry and intact  without significant lesions or rashes Neuro: Alert and  oriented x4;  No focal deficits. Psych:  Cooperative. Normal mood and affect.    Vladimir Crofts, PA-C 3:41 PM

## 2022-08-26 ENCOUNTER — Ambulatory Visit: Payer: Medicare Other | Admitting: Physician Assistant

## 2022-08-26 ENCOUNTER — Encounter: Payer: Self-pay | Admitting: Physician Assistant

## 2022-08-26 VITALS — BP 122/68 | HR 68 | Ht 60.0 in | Wt 118.0 lb

## 2022-08-26 DIAGNOSIS — R339 Retention of urine, unspecified: Secondary | ICD-10-CM

## 2022-08-26 DIAGNOSIS — G35 Multiple sclerosis: Secondary | ICD-10-CM | POA: Diagnosis not present

## 2022-08-26 DIAGNOSIS — K5904 Chronic idiopathic constipation: Secondary | ICD-10-CM | POA: Diagnosis not present

## 2022-08-26 DIAGNOSIS — R1032 Left lower quadrant pain: Secondary | ICD-10-CM

## 2022-08-26 NOTE — Patient Instructions (Signed)
You have been scheduled for a CT scan of the abdomen and pelvis at [facility], 1st floor Radiology. You are scheduled on 09/16/22 at 12:30 pm. You should arrive by 10:30 am minutes prior to your appointment time for registration.   If you have any questions regarding your exam or if you need to reschedule, you may call [facility] at [phone] between the hours of 8:00 am and 5:00 pm, Monday-Friday.     Will get CT AB and pelvis to evaluate with constipation and urinary retention  Can add senokot 3 x a week or  nightly.   We could also refer to pelvic floor physical therapy again.  FIBER SUPPLEMENT You can do metamucil or fibercon once or twice a day but if this causes gas/bloating please switch to Benefiber or Citracel.  Fiber is good for constipation/diarrhea/irritable bowel syndrome.  It can also help with weight loss and can help lower your bad cholesterol (LDL).  Please do 1 TBSP in the morning in water, coffee, or tea.  It can take up to a month before you can see a difference with your bowel movements.  It is cheapest from [employer], [employer], [employer].     Pelvic Floor Dysfunction, Female Pelvic floor dysfunction (PFD) is a condition that results when the group of muscles and connective tissues that support the organs in the pelvis (pelvic floor muscles) do not work well. These muscles and their connections form a sling that supports the colon and bladder. In women, they also support the uterus. PFD causes pelvic floor muscles to be too weak, too tight, or both. In PFD, muscle movements are not coordinated. This may cause bowel or bladder problems. It may also cause pain. What are the causes? This condition may be caused by an injury to the pelvic area or by a weakening of pelvic muscles. This often results from pregnancy and childbirth or other types of strain. In many cases, the exact cause is not known. What increases the risk? The following factors may make you  more likely to develop this condition: Having chronic bladder tissue inflammation (interstitial cystitis). Being an older person. Being overweight. History of radiation treatment for cancer in the pelvic region. Previous pelvic surgery, such as removal of the uterus (hysterectomy). What are the signs or symptoms? Symptoms of this condition vary and may include: Bladder symptoms, such as: Trouble starting urination and emptying the bladder. Frequent urinary tract infections. Leaking urine when coughing, laughing, or exercising (stress incontinence). Having to pass urine urgently or frequently. Pain when passing urine. Bowel symptoms, such as: Constipation. Urgent or frequent bowel movements. Incomplete bowel movements. Painful bowel movements. Leaking stool or gas. Unexplained genital or rectal pain. Genital or rectal muscle spasms. Low back pain. Other symptoms may include: A heavy, full, or aching feeling in the vagina. A bulge that protrudes into the vagina. Pain during or after sex. How is this diagnosed? This condition may be diagnosed based on: Your symptoms and medical history. A physical exam. During the exam, your health care provider may check your pelvic muscles for tightness, spasm, pain, or weakness. This may include a rectal exam and a pelvic exam. In some cases, you may have diagnostic tests, such as: Electrical muscle function tests. Urine flow testing. X-ray tests of bowel function. Ultrasound of the pelvic organs. How is this treated? Treatment for this condition depends on the symptoms. Treatment options include: Physical therapy. This may include Kegel exercises to help relax or strengthen the pelvic  floor muscles. Biofeedback. This type of therapy provides feedback on how tight your pelvic floor muscles are so that you can learn to control them. Internal or external massage therapy. A treatment that involves electrical stimulation of the pelvic floor  muscles to help control pain (transcutaneous electrical nerve stimulation, or TENS). Sound wave therapy (ultrasound) to reduce muscle spasms. Medicines, such as: Muscle relaxants. Bladder control medicines. Surgery to reconstruct or support pelvic floor muscles may be an option if other treatments do not help. Follow these instructions at home: Activity Do your usual activities as told by your health care provider. Ask your health care provider if you should modify any activities. Do pelvic floor strengthening or relaxing exercises at home as told by your physical therapist. Lifestyle Maintain a healthy weight. Eat foods that are high in fiber, such as beans, whole grains, and fresh fruits and vegetables. Limit foods that are high in fat and processed sugars, such as fried or sweet foods. Manage stress with relaxation techniques such as yoga or meditation. General instructions If you have problems with leakage: Use absorbable pads or wear padded underwear. Wash frequently with mild soap. Keep your genital and anal area as clean and dry as possible. Ask your health care provider if you should try a barrier cream to prevent skin irritation. Take warm baths to relieve pelvic muscle tension or spasms. Take over-the-counter and prescription medicines only as told by your health care provider. Keep all follow-up visits. How is this prevented? The cause of PFD is not always known, but there are a few things you can do to reduce the risk of developing this condition, including: Staying at a healthy weight. Getting regular exercise. Managing stress. Contact a health care provider if: Your symptoms are not improving with home care. You have signs or symptoms of PFD that get worse at home. You develop new signs or symptoms. You have signs of a urinary tract infection, such as: Fever. Chills. Increased urinary frequency. A burning feeling when urinating. You have not had a bowel movement in  3 days (constipation). Summary Pelvic floor dysfunction results when the muscles and connective tissues in your pelvic floor do not work well. These muscles and their connections form a sling that supports your colon and bladder. In women, they also support the uterus. PFD may be caused by an injury to the pelvic area or by a weakening of pelvic muscles. PFD causes pelvic floor muscles to be too weak, too tight, or a combination of both. Symptoms may vary from person to person. In most cases, PFD can be treated with physical therapies and medicines. Surgery may be an option if other treatments do not help. This information is not intended to replace advice given to you by your health care provider. Make sure you discuss any questions you have with your health care provider. Document Revised: 09/19/2020 Document Reviewed: 09/19/2020 Elsevier Patient Education  [place].

## 2022-08-28 ENCOUNTER — Ambulatory Visit: Payer: Medicare Other | Admitting: Family Medicine

## 2022-08-28 ENCOUNTER — Encounter: Payer: Self-pay | Admitting: Family Medicine

## 2022-08-28 ENCOUNTER — Encounter: Payer: Medicare Other | Admitting: Physical Therapy

## 2022-08-28 VITALS — BP 140/90 | HR 68 | Ht 60.0 in | Wt 118.0 lb

## 2022-08-28 DIAGNOSIS — M5412 Radiculopathy, cervical region: Secondary | ICD-10-CM

## 2022-08-28 DIAGNOSIS — M4312 Spondylolisthesis, cervical region: Secondary | ICD-10-CM | POA: Diagnosis not present

## 2022-08-28 DIAGNOSIS — G35 Multiple sclerosis: Secondary | ICD-10-CM

## 2022-08-28 NOTE — Assessment & Plan Note (Signed)
Patient seems to be making some improvement at this time.  I believe that patient's strength is significantly improved.  Patient feels though that the physical therapy is too aggressive and patient is going to take another 2 weeks I encourage patient that I do think that this will be more beneficial.  Does not feel that the Cymbalta has been as helpful as previously.  Patient is concerned it may be interacting with other medications that she has been told she states by a pharmacist.  We discussed that there is some not likely a side effect but patient is having urinary retention and is seen multiple providers including a urologist.  Encourage patient to continue to follow-up with neurosurgery as well.  Patient states that she still has some follow-up is hopeful that she will continue to improve.

## 2022-08-28 NOTE — Patient Instructions (Signed)
Sorry you're going through everything Lets try to decrease cymbalta only daily for 1 week to 20mg  If any symptoms improve discontinue, if worsen increase back to 40mg  When taking tramadol take reg strength tylenol with it Maybe take 1-2 weeks off from PT See you again in 2-3 months

## 2022-08-28 NOTE — Assessment & Plan Note (Signed)
Questionable cause patient urinary retention

## 2022-08-31 ENCOUNTER — Other Ambulatory Visit: Payer: Self-pay | Admitting: Family Medicine

## 2022-08-31 ENCOUNTER — Other Ambulatory Visit: Payer: Self-pay | Admitting: Internal Medicine

## 2022-09-01 ENCOUNTER — Encounter: Payer: Medicare Other | Admitting: Physical Therapy

## 2022-09-01 ENCOUNTER — Telehealth: Payer: Self-pay | Admitting: *Deleted

## 2022-09-01 NOTE — Telephone Encounter (Signed)
Patient requested a sample os [name]. Patient stopped by the office for sample today.  Medication Samples have been provided to the patient.  Drug name: [name]       Strength: 15 mg        Qty: 2  LOT: [id number]  Exp.Date: 10/19/2023  Dosing instructions: Take one tablet by mouth daily.

## 2022-09-04 ENCOUNTER — Encounter: Payer: Medicare Other | Admitting: Physical Therapy

## 2022-09-10 ENCOUNTER — Ambulatory Visit: Payer: Medicare Other | Admitting: Neurology

## 2022-09-10 ENCOUNTER — Telehealth: Payer: Self-pay | Admitting: Neurology

## 2022-09-10 ENCOUNTER — Encounter: Payer: Self-pay | Admitting: Neurology

## 2022-09-10 VITALS — BP 132/72 | HR 88 | Ht 61.0 in | Wt 118.5 lb

## 2022-09-10 DIAGNOSIS — M0579 Rheumatoid arthritis with rheumatoid factor of multiple sites without organ or systems involvement: Secondary | ICD-10-CM

## 2022-09-10 DIAGNOSIS — M5412 Radiculopathy, cervical region: Secondary | ICD-10-CM

## 2022-09-10 DIAGNOSIS — R202 Paresthesia of skin: Secondary | ICD-10-CM | POA: Diagnosis not present

## 2022-09-10 DIAGNOSIS — R200 Anesthesia of skin: Secondary | ICD-10-CM

## 2022-09-10 DIAGNOSIS — G35 Multiple sclerosis: Secondary | ICD-10-CM | POA: Diagnosis not present

## 2022-09-10 DIAGNOSIS — G5603 Carpal tunnel syndrome, bilateral upper limbs: Secondary | ICD-10-CM

## 2022-09-10 DIAGNOSIS — R269 Unspecified abnormalities of gait and mobility: Secondary | ICD-10-CM

## 2022-09-10 NOTE — Progress Notes (Signed)
[facility]  PATIENT: Sherri [name] DOB: 01/01/1941  REFERRING CLINICIAN: Marga Melnick  HISTORY FROM: patient REASON FOR VISIT: Multiple sclerosis   HISTORICAL  CHIEF COMPLAINT:  Chief Complaint  Patient presents with   Room 10    Pt is here Alone. Pt states that he had 3 major surgeries 2 Lumbar and 1 Cervical. Pt states that her left hand has been numb since before the surgeries. Pt states that she is starting to get pain in her feet and the joints in her toes are swelling.     HISTORY OF PRESENT ILLNESS:  Sherri [name] is a 82 y.o. woman who was diagnosed with relapsing remitting MS in 2010.     Update 09/10/2022: Her MS is stable off DMT since 2019.    She has no exacerbation or new neulogic symptom attributable to MS.     She has had more spinal surgery.   She has had more cervical and lumbar surgery.      I had been concerned about C8 radiculopathy at the last visit and cervical spine MRI confirmed C7T1 anterolisthesis and DJD.   She also had spinal stneosis in lumbar spine and had surgery x 2 (due to hardware failure from osteoporosis).     Her right leg jerks at night  reporting more pain in her hands.   She takes tramadol 50 mg x 2 during the day   She has RA.   She has more symptoms in her hands.   She is on [name] and MTX which has helped some.   She has OA as well as RA.   She sees Dr. Corliss Skains for RA.        MS History:    When she was in her late 78s she had an episode of right-sided numbness and she lost the use of her right arm for a couple of weeks. At that time, she was told that she might have MS. Over the next 30 or 40 years she had several more similar but milder episodes. In 2010, she had an MRI of the cervical spine (for arm pain) and it showed foci c/w multiple sclerosis. An MRI of the brain was also performed  shortly thereafter showing white matter lesions consistent with the diagnosis.  Initially, she was placed on Avonex. She also  has rheumatoid arthritis and was placed on methotrexate and then a combination of methotrexate with Harriette Ohara. We decided to stop the Avonex as she probably would get some benefit from those medications for the MS as well. For the most part her MS has been stable over the past 5 years.However, early 2016,  she had numbness in the left face and weakness in her legs.  She was on Aubagio from 2016 to 2019 when she stopped all DMT   Cervical spine 03/28/2022 showed: IMPRESSION: This MRI of the cervical spine without contrast shows the following: T2 hyperintense foci within the spinal cord adjacent to C2, C2-C3 and C6 consistent with her diagnosis of MS.  These foci were present in 2016 by report the more difficult to see on the 2019 MRI. Posterior fusion at C3-C4 and ACDF from C4-C7 At C2-C3, there is a midline disc herniation and 1 to 2 mm of anterolisthesis causing mild spinal stenosis but no nerve root compression.  This has markedly progressed compared to the 11/13/2017 MRI. Surgical fusion but no spinal stenosis or nerve root compression from C3-C7. At C7-T1, there is a 5 mm anterolisthesis, disc protrusion,  ligamentum flavum and facet hypertrophy combining to cause moderate spinal stenosis and severe left foraminal narrowing with potential for left C8 nerve root compression.  The degenerative changes have significantly progressed compared to the 11/13/2017 MRI.  REVIEW OF SYSTEMS:  Constitutional: No fevers, chills, sweats, or change in appetite Eyes: No visual changes, double vision, eye pain Ear, nose and throat: No hearing loss, ear pain, nasal congestion, sore throat Cardiovascular: No chest pain, palpitations Respiratory:  No shortness of breath at rest or with exertion.   No wheezes GastrointestinaI: No nausea, vomiting, diarrhea, abdominal pain, fecal incontinence Genitourinary:  as above. Musculoskeletal:  No neck pain, back pain Integumentary: No rash, pruritus, skin lesions Neurological:  as above Psychiatric: he has had a lot of family stress leading to some depression and more anxiety. Endocrine: No palpitations, diaphoresis, change in appetite, change in weigh or increased thirst Hematologic/Lymphatic:  No anemia, purpura, petechiae. Allergic/Immunologic: No itchy/runny eyes, nasal congestion, recent allergic reactions, rashes  ALLERGIES: Allergies  Allergen Reactions   Darvon [Propoxyphene Hcl] Shortness Of Breath    ? Dose Related ? Lowered respirations greatly   Demerol [Meperidine] Shortness Of Breath and Other (See Comments)    Respiratory Distress   Penicillins Hives and Other (See Comments)    Has patient had a PCN reaction causing immediate rash, facial/tongue/throat swelling, SOB or lightheadedness with hypotension: No SEVERE RASH INVOLVING MUCUS MEMBRANES or SKIN NECROSIS: #  #  #  YES  #  #  #  Has patient had a PCN reaction that required hospitalization No Has patient had a PCN reaction occurring within the last 10 years: No.    Imuran [Azathioprine] Other (See Comments)    Weakness   Sulfa Antibiotics Nausea Only and Other (See Comments)    States had severe indigestion and heartburn and nausea and had to stop taking   Arava [Leflunomide] Other (See Comments)    Excessive weight gain   Lipitor [Atorvastatin] Nausea And Vomiting    Currently taking 2 times weekly - able to tolerate low dose    Vytorin [Ezetimibe-Simvastatin] Nausea And Vomiting    HOME MEDICATIONS:   PAST MEDICAL HISTORY: Past Medical History:  Diagnosis Date   Anxiety    takes Valium daily as needed   Basal cell carcinoma 01/21/1988   left nostril (MOHS), sup-right calf (CX35FU)   Basal cell carcinoma 03/22/1996   right calf (CX35FU)   Basal cell carcinoma 03/31/1995   left wing nose   Bruises easily    d/t meds   Cancer    basal cell ca, in situ- uterine    Cataracts, bilateral    removed bilateral   Chronic back pain    Dizziness    r/t to meds   GERD  (gastroesophageal reflux disease)    no meds on a regular basis but will take Tums if needed   Headache(784.0)    r/t neck issues   History of bronchitis 6-11yrs ago   History of colon polyps    benign   HOH (hard of hearing)    wears hearing aids   Hyperlipidemia    takes Atorvastatin on Mondays and Fridays   Hypertension    Joint pain    Joint swelling    Multiple sclerosis    Neuromuscular disorder    Dr. [place] Nation- [facility], follows [facility].   Nocturia    PONV (postoperative nausea and vomiting)    trouble urinating after surgery in 2014   Postoperative nausea and vomiting 01/11/2019  Rheumatoid arthritis    Dr Glo Herring weekly, RA- hands- knees- feet    Rheumatoid arthritis(714.0)    Dr Corliss Skains takes Harriette Ohara daily   Right wrist fracture    Skin cancer    Squamous cell carcinoma of skin 11/02/2001   in situ-right knee (cx12fu)   Squamous cell carcinoma of skin 11/12/2011   in situ-left forearm (CX35FU), in situ-left foot (CX35FU)   Squamous cell carcinoma of skin 01/22/2012   in situ-left lower forearm (txpbx)   Squamous cell carcinoma of skin 11/17/2012   right shin (txpbx)   Squamous cell carcinoma of skin 10/28/2013   in situ-right shoulder (CX35FU)   Squamous cell carcinoma of skin 04/28/2014   well diff-right shin (txpbx)   Squamous cell carcinoma of skin 11/02/2014   in situ-Left shin (txpbx)   Squamous cell carcinoma of skin 12/15/2014   in situ-Left hand (txpbx), bowens-left side chest (txpbx)   Squamous cell carcinoma of skin 05/09/2015   in situ-left hand (txpbx)    Squamous cell carcinoma of skin 05/07/2016   in situ-left inner shin,ant, in situ-left inner shin, post, in situ-left outer forearm, in situ-right knuckle   Squamous cell carcinoma of skin 03/1302018   in situ-left shoulder (txpbx), in situ-right inner shin (txpbx), in situ-top of left foot (txpbx), KA- left forearm (txpbx)   Squamous cell carcinoma of skin 12/02/2016   in  situ-left inner shin (txpbx)   Squamous cell carcinoma of skin 03/04/2017   in situ-left outer sup, shin (txpbx), in situ- right 2nd knuckle finger (txpbx), in situ- Left wrist (txpbx)   Squamous cell carcinoma of skin 04/06/2018   in situ-above left knee inner (txpbx)   Squamous cell carcinoma of skin 04/21/2018   in situ-right top hand (txpbx)   Squamous cell carcinoma of skin 07/08/2018   in situ-right lower inner shin (txpbx)   Squamous cell carcinoma of skin 04/27/2019   in situ- Left neck(CX35FU), In situ- right neck (CX35FU)   Urinary retention    sees Dr.Wrenn about 2 times a yr    PAST SURGICAL HISTORY: Past Surgical History:  Procedure Laterality Date   ABDOMINAL HYSTERECTOMY     1985   ANTERIOR CERVICAL DECOMP/DISCECTOMY FUSION N/A 04/23/2022   Procedure: Anterior Cervical Decompression/Discectomy Fusion - Cervical Seven-Thoracic One;  Surgeon: Julio Sicks, MD;  Location: [facility] OR;  Service: Neurosurgery;  Laterality: N/A;  3C   APPENDECTOMY     with TAH   BACK SURGERY     several   BREAST BIOPSY Right    Results were negative   BROW LIFT Bilateral 10/02/2016   Procedure: BILATERAL LOWER LID BLEPHAROPLASTY;  Surgeon: Floydene Flock, MD;  Location: [facility] OR;  Service: Plastics;  Laterality: Bilateral;   CARPAL TUNNEL RELEASE Right    cataracts     CERVICAL FUSION     Dr Jeral Fruit   CERVICAL FUSION  12/31/2012   Dr Jeral Fruit   CERVICAL FUSION  04/2022   CHEST TUBE INSERTION     for traumatic Pneumothorax   COLONOSCOPY  07/16/2010   normal    COLONOSCOPY W/ POLYPECTOMY  1997   negative since; Dr Arlyce Dice   ESOPHAGOGASTRODUODENOSCOPY  07/16/2010   normal   eye lid raise     EYE SURGERY Bilateral    cataracts removed - /w IOL   HARDWARE REMOVAL  08/2021   JOINT REPLACEMENT     LUMBAR FUSION     Dr Jeral Fruit   NASAL SINUS SURGERY     POSTERIOR CERVICAL FUSION/FORAMINOTOMY  N/A 12/31/2012   Procedure: POSTERIOR LATERAL CERVICAL FUSION/FORAMINOTOMY LEVEL 1 CERVICAL  THREE-FOUR WITH LATERAL MASS SCREWS;  Surgeon: Karn Cassis, MD;  Location: [facility];  Service: Neurosurgery;  Laterality: N/A;   PTOSIS REPAIR Bilateral 10/02/2016   Procedure: INTERNAL PTOSIS REPAIR;  Surgeon: Floydene Flock, MD;  Location: [facility] OR;  Service: Plastics;  Laterality: Bilateral;   SKIN BIOPSY     THYROID SURGERY     R lobe removed- 1972, has grown back - CT last done- 2018   TOTAL HIP ARTHROPLASTY  12/22/2011   Procedure: TOTAL HIP ARTHROPLASTY;  Surgeon: Nestor Lewandowsky, MD;  Location: [facility] OR;  Service: Orthopedics;  Laterality: Left;   TOTAL SHOULDER ARTHROPLASTY     TUBAL LIGATION     UPPER GASTROINTESTINAL ENDOSCOPY  2012   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    FAMILY HISTORY: Family History  Problem Relation Age of Onset   Cancer Mother        pancreatic   Diabetes Mother    Pancreatic cancer Mother    Heart disease Father        Rheumatic   Cancer Father        ? stomach   Stomach cancer Father    Cancer Sister        stomach   Stomach cancer Sister    Cancer Maternal Aunt        X 4; ? primary   Heart disease Paternal Aunt    Cancer Maternal Grandmother        cervical   Colon cancer Other        Aunts   Multiple sclerosis Daughter    Hypertension Neg Hx    Stroke Neg Hx    Esophageal cancer Neg Hx    Rectal cancer Neg Hx     SOCIAL HISTORY:  Social History   Socioeconomic History   Marital status: Married    Spouse name: Amada Jupiter   Number of children: 2   Years of education: Not on file   Highest education level: Not on file  Occupational History   Occupation: retired  Tobacco Use   Smoking status: Former    Packs/day: 2.50    Years: 20.00    Additional pack years: 0.00    Total pack years: 50.00    Types: Cigarettes    Quit date: 05/26/1981    Years since quitting: 41.3    Passive exposure: Never   Smokeless tobacco: Never   Tobacco comments:    Smoked [phone], up to 2.5 ppd  Vaping Use   Vaping Use: Never used   Substance and Sexual Activity   Alcohol use: Yes    Alcohol/week: 14.0 standard drinks of alcohol    Types: 14 Cans of beer per week    Comment: 2% beer   Drug use: Never   Sexual activity: Yes    Birth control/protection: Surgical    Comment: Hysterectomy  Other Topics Concern   Not on file  Social History Narrative   Not on file   Social Determinants of Health   Financial Resource Strain: Low Risk  (02/21/2022)   Overall Financial Resource Strain (CARDIA)    Difficulty of Paying Living Expenses: Not hard at all  Food Insecurity: No Food Insecurity (04/25/2022)   Hunger Vital Sign    Worried About Running Out of Food in the Last Year: Never true    Ran Out of Food in the Last Year: Never true  Transportation  Needs: No Transportation Needs (04/25/2022)   PRAPARE - Administrator, Civil Service (Medical): No    Lack of Transportation (Non-Medical): No  Physical Activity: Inactive (02/21/2022)   Exercise Vital Sign    Days of Exercise per Week: 0 days    Minutes of Exercise per Session: 0 min  Stress: Stress Concern Present (02/21/2022)   [employer] of Occupational Health - Occupational Stress Questionnaire    Feeling of Stress : Rather much  Social Connections: Moderately Isolated (02/21/2022)   Social Connection and Isolation Panel [NHANES]    Frequency of Communication with Friends and Family: More than three times a week    Frequency of Social Gatherings with Friends and Family: Twice a week    Attends Religious Services: Never    Database administrator or Organizations: No    Attends Banker Meetings: Never    Marital Status: Married  Catering manager Violence: Not At Risk (02/21/2022)   Humiliation, Afraid, Rape, and Kick questionnaire    Fear of Current or Ex-Partner: No    Emotionally Abused: No    Physically Abused: No    Sexually Abused: No     PHYSICAL EXAM  Vitals:   09/10/22 1603  BP: 132/72  Pulse: 88  Weight: 118 lb 8 oz  (53.8 kg)  Height: 5\' 1"  (1.549 m)    Body mass index is 22.39 kg/m.   General: The patient is well-developed and well-nourished and in no acute distress  Musculoskeletal: She has mild tenderness over the MCP and CMC joints at the thumb base on the right greater than left.  There are degenerative changes of OA and OA in both hands.   Reduced range of motion in the neck.  Skin: Extremities are without rash or edema.  Neurologic Exam  Mental status: The patient is alert and oriented x 3 at the time of the examination. The patient has apparent normal recent and remote memory, with an apparently normal attention span and concentration ability.   Speech is normal.  CN:  EOMI, Intact facial sensation..  Facial strength is normal.  Trapezius strength is normal..  Palatal elevation is normal and Tongue protrusion midline.    No obvious hearing deficits are noted.  Motor:  Right biceps bulges (she has a history of rupture).   Tone is mildly increased in her legs, left > right.  Strength was 5/5 in the ulnar innervated hand muscles on the left and 4+/5 in the left APB muscle.     Sensory: Sensory testing is intact to touch and vibration in the upper arms and both legs except for reduced sensation over the C8 dermatome on her left  Coordination: Cerebellar testing reveals good rapid alternating movements in hands . Slightly reduced right heel to shin     Gait and station: Station is stable.  Gait is arthritic and mildly wide.    Tandem gait is wide.  Romberg is negative   Reflexes: Deep tendon reflexes are normal and symmetric.    DIAGNOSTIC DATA (LABS, IMAGING, TESTING) - I reviewed patient records, labs, notes, testing and imaging myself where available.  Lab Results  Component Value Date   WBC 4.6 07/17/2022   HGB 12.0 07/17/2022   HCT 34.5 (L) 07/17/2022   MCV 99.1 07/17/2022   PLT 323 07/17/2022      Component Value Date/Time   NA 136 07/17/2022 1459   NA 139 06/20/2019 1335    NA 136 02/22/2015 0000  K 5.1 07/17/2022 1459   K 4.4 02/22/2015 0000   CL 99 07/17/2022 1459   CL 98 02/22/2015 0000   CO2 29 07/17/2022 1459   CO2 30 02/22/2015 0000   GLUCOSE 90 07/17/2022 1459   BUN 13 07/17/2022 1459   BUN 10 06/20/2019 1335   CREATININE 0.88 07/17/2022 1459   CALCIUM 9.4 07/17/2022 1459   CALCIUM 9.6 02/22/2015 0000   PROT 6.6 07/17/2022 1459   PROT 6.4 06/20/2019 1335   PROT 6.6 02/22/2015 0000   ALBUMIN 4.2 04/10/2020 0914   ALBUMIN 4.2 06/20/2019 1335   ALBUMIN 4.3 02/22/2015 0000   AST 29 07/17/2022 1459   AST 24 02/22/2015 0000   ALT 18 07/17/2022 1459   ALT 18 02/22/2015 0000   ALKPHOS 29 (L) 04/10/2020 0914   ALKPHOS 34 02/22/2015 0000   BILITOT 0.5 07/17/2022 1459   BILITOT 0.3 06/20/2019 1335   GFRNONAA >60 04/16/2022 1400   GFRNONAA 56 (L) 09/26/2020 1536   GFRAA 65 09/26/2020 1536   Lab Results  Component Value Date   CHOL 214 (H) 06/28/2021   HDL 88 06/28/2021   LDLCALC 105 (H) 06/28/2021   LDLDIRECT 122.9 11/15/2012   TRIG 110 06/28/2021   CHOLHDL 2.4 06/28/2021        ASSESSMENT AND PLAN  Multiple sclerosis (HCC) - Plan: MR BRAIN WO CONTRAST  Rheumatoid arthritis involving multiple sites with positive rheumatoid factor  Numbness and tingling in left hand  Cervical radiculopathy at C8  Gait disturbance  Carpal tunnel syndrome on both sides   1.   She will continue off of DMTs for MS.   She has been stable.  We will check brain MRi to see if progression   If so consider restarting an MS DMT 2.   Pain and left arm strength are improved after surgery.    Ok to continue tramadol bid-tid for pain  3.  Try to stay active.   Continue RA med's.    4.   rtc 12 months sooner if problems   Sherri [name] A. Epimenio Foot, MD, PhD 09/10/2022, 4:30 PM Certified in Neurology, Clinical Neurophysiology, Sleep Medicine, Pain Medicine and Neuroimaging  [facility] [address], [address] [place], [place] [address] [phone]

## 2022-09-10 NOTE — Telephone Encounter (Signed)
[employer] medicare NPR sent to GI [phone] 

## 2022-09-16 ENCOUNTER — Emergency Department (HOSPITAL_COMMUNITY)
Admission: RE | Admit: 2022-09-16 | Discharge: 2022-09-16 | Disposition: A | Discharge status: Home / Self Care | Payer: Medicare Other | Source: Ambulatory Visit | Attending: Physician Assistant | Admitting: Physician Assistant

## 2022-09-16 ENCOUNTER — Other Ambulatory Visit: Payer: Self-pay

## 2022-09-16 ENCOUNTER — Emergency Department (HOSPITAL_COMMUNITY): Payer: Medicare Other

## 2022-09-16 ENCOUNTER — Emergency Department (HOSPITAL_COMMUNITY)
Admission: EM | Admit: 2022-09-16 | Discharge: 2022-09-16 | Disposition: A | Discharge status: Home / Self Care | Payer: Medicare Other | Attending: Emergency Medicine | Admitting: Emergency Medicine

## 2022-09-16 ENCOUNTER — Encounter (HOSPITAL_COMMUNITY): Payer: Self-pay | Admitting: *Deleted

## 2022-09-16 DIAGNOSIS — S0181XA Laceration without foreign body of other part of head, initial encounter: Secondary | ICD-10-CM | POA: Insufficient documentation

## 2022-09-16 DIAGNOSIS — K449 Diaphragmatic hernia without obstruction or gangrene: Secondary | ICD-10-CM | POA: Diagnosis not present

## 2022-09-16 DIAGNOSIS — S62102A Fracture of unspecified carpal bone, left wrist, initial encounter for closed fracture: Secondary | ICD-10-CM | POA: Diagnosis not present

## 2022-09-16 DIAGNOSIS — K5904 Chronic idiopathic constipation: Secondary | ICD-10-CM | POA: Insufficient documentation

## 2022-09-16 DIAGNOSIS — I6782 Cerebral ischemia: Secondary | ICD-10-CM | POA: Diagnosis not present

## 2022-09-16 DIAGNOSIS — S52502A Unspecified fracture of the lower end of left radius, initial encounter for closed fracture: Secondary | ICD-10-CM | POA: Diagnosis not present

## 2022-09-16 DIAGNOSIS — R339 Retention of urine, unspecified: Secondary | ICD-10-CM | POA: Insufficient documentation

## 2022-09-16 DIAGNOSIS — I10 Essential (primary) hypertension: Secondary | ICD-10-CM | POA: Diagnosis not present

## 2022-09-16 DIAGNOSIS — R1032 Left lower quadrant pain: Secondary | ICD-10-CM

## 2022-09-16 DIAGNOSIS — M5021 Other cervical disc displacement,  high cervical region: Secondary | ICD-10-CM | POA: Insufficient documentation

## 2022-09-16 DIAGNOSIS — M25532 Pain in left wrist: Secondary | ICD-10-CM | POA: Diagnosis not present

## 2022-09-16 DIAGNOSIS — M50221 Other cervical disc displacement at C4-C5 level: Secondary | ICD-10-CM | POA: Diagnosis not present

## 2022-09-16 DIAGNOSIS — W1830XA Fall on same level, unspecified, initial encounter: Secondary | ICD-10-CM | POA: Insufficient documentation

## 2022-09-16 DIAGNOSIS — S6292XA Unspecified fracture of left wrist and hand, initial encounter for closed fracture: Secondary | ICD-10-CM | POA: Diagnosis not present

## 2022-09-16 DIAGNOSIS — K5730 Diverticulosis of large intestine without perforation or abscess without bleeding: Secondary | ICD-10-CM | POA: Diagnosis not present

## 2022-09-16 DIAGNOSIS — S52352A Displaced comminuted fracture of shaft of radius, left arm, initial encounter for closed fracture: Secondary | ICD-10-CM | POA: Diagnosis not present

## 2022-09-16 HISTORY — PX: WRIST SURGERY: SHX841

## 2022-09-16 MED ORDER — IOHEXOL 300 MG/ML  SOLN
100.0000 mL | Freq: Once | INTRAMUSCULAR | Status: AC | PRN
  Administered 2022-09-16: 100 mL via INTRAVENOUS

## 2022-09-16 MED ORDER — OXYCODONE HCL 5 MG PO TABS: 2.5000 mg | ORAL_TABLET | ORAL | 0 refills | Status: DC | PRN

## 2022-09-16 MED ORDER — SODIUM CHLORIDE (PF) 0.9 % IJ SOLN
INTRAMUSCULAR | Status: AC
  Filled 2022-09-16: qty 50

## 2022-09-16 MED ORDER — OXYCODONE HCL 5 MG PO TABS
5.0000 mg | ORAL_TABLET | Freq: Once | ORAL | Status: DC
  Filled 2022-09-16: qty 1

## 2022-09-16 MED ORDER — IOHEXOL 9 MG/ML PO SOLN
500.0000 mL | ORAL | Status: AC
  Administered 2022-09-16: 1000 mL via ORAL

## 2022-09-16 MED ORDER — IOHEXOL 9 MG/ML PO SOLN
ORAL | Status: AC
  Filled 2022-09-16: qty 2000

## 2022-09-16 MED ORDER — LIDOCAINE HCL (PF) 1 % IJ SOLN
30.0000 mL | Freq: Once | INTRAMUSCULAR | Status: DC
  Filled 2022-09-16: qty 30

## 2022-09-16 NOTE — ED Triage Notes (Addendum)
Pt stepped off curb and and dripped and fell and struck her head and feels that she broke her left wrist.  Pt states that she lost her footing and this caused her to fall.  Pt notes that while she was falling she felt a little dizzy and she attributes this to her cervial surgery and the way she was moving her head during fall.  No neck pain. C-collar applied. No LOC, pt is not on any blood thinners

## 2022-09-16 NOTE — ED Provider Notes (Signed)
[name]   CSN: [id number] Arrival date & time: 09/16/22  1356     History Chief Complaint  Patient presents with   Fall    HPI Sherri [name] is a 82 y.o. female presenting for ground-level fall.  States that she has a history of vertigo when she turns her head to the side and she was looking for a car in the parking lot today when she looked into her left and became very dizzy falling to her left side.  She fell on her left arm outstretched has obvious deformity of her left wrist.  It is neurovascularly intact. She also hit her left forehead on the concrete and has a 1 cm laceration. she denies fevers chills nausea vomiting syncope or shortness of breath..   Patient's recorded medical, surgical, social, medication list and allergies were reviewed in the Snapshot window as part of the initial history.   Review of Systems   Review of Systems  Constitutional:  Negative for chills and fever.  HENT:  Negative for ear pain and sore throat.   Eyes:  Negative for pain and visual disturbance.  Respiratory:  Negative for cough and shortness of breath.   Cardiovascular:  Negative for chest pain and palpitations.  Gastrointestinal:  Negative for abdominal pain and vomiting.  Genitourinary:  Negative for dysuria and hematuria.  Musculoskeletal:  Negative for arthralgias and back pain.  Skin:  Negative for color change and rash.  Neurological:  Negative for seizures and syncope.  All other systems reviewed and are negative.   Physical Exam Updated Vital Signs BP (!) 158/94 (BP Location: Right Arm)   Pulse 86   Temp 97.7 F (36.5 C) (Oral)   Resp 16   SpO2 98%  Physical Exam Vitals and nursing note reviewed.  Constitutional:      General: She is not in acute distress.    Appearance: She is well-developed.  HENT:     Head: Normocephalic and atraumatic.  Eyes:     Conjunctiva/sclera: Conjunctivae normal.   Cardiovascular:     Rate and Rhythm: Normal rate and regular rhythm.     Heart sounds: No murmur heard. Pulmonary:     Effort: Pulmonary effort is normal. No respiratory distress.     Breath sounds: Normal breath sounds.  Abdominal:     General: There is no distension.     Palpations: Abdomen is soft.     Tenderness: There is no abdominal tenderness. There is no right CVA tenderness or left CVA tenderness.  Musculoskeletal:        General: Deformity (Obvious left wrist deformity as well as a 1 cm laceration to left forehead.) present. No swelling or tenderness. Normal range of motion.     Cervical back: Neck supple.  Skin:    General: Skin is warm and dry.  Neurological:     General: No focal deficit present.     Mental Status: She is alert and oriented to person, place, and time. Mental status is at baseline.     Cranial Nerves: No cranial nerve deficit.      ED Course/ Medical Decision Making/ A&P    Procedures .Ortho Injury Treatment  Date/Time: 09/16/2022 4:02 PM  Performed by: Glyn Ade, MD Authorized by: Glyn Ade, MD   Consent:    Consent obtained:  Verbal   Consent given by:  Patient   Alternatives discussed:  No treatment, alternative treatment, immobilization and referralInjury location: wrist  Location details: left wrist Injury type: fracture-dislocation Pre-procedure neurovascular assessment: neurovascularly intact Anesthesia: hematoma block  Anesthesia: Local anesthesia used: yes Local Anesthetic: lidocaine 1% without epinephrine Anesthetic total: 10 mL  Patient sedated: NoManipulation performed: yes Reduction successful: yes X-ray confirmed reduction: yes Immobilization: splint Splint Applied by: ED Provider Supplies used: plaster Post-procedure neurovascular assessment: post-procedure neurovascularly intact      Medications Ordered in ED Medications  oxyCODONE (Oxy IR/ROXICODONE) immediate release tablet 5 mg (has no  administration in time range)  lidocaine (PF) (XYLOCAINE) 1 % injection 30 mL (has no administration in time range)   Medical Decision Making:    Sherri [name] is a 82 y.o. female who presented to the ED today with a moderate mechanisma trauma, detailed above.    Given this mechanism of trauma, a full physical exam was performed. Notably, patient was HDS in NAD.   Reviewed and confirmed nursing documentation for past medical history, family history, social history.    Initial Assessment/Plan:   This is a patient presenting with a moderate mechanism trauma.  As such, I have considered intracranial injuries including intracranial hemorrhage, intrathoracic injuries including blunt myocardial or blunt lung injury, blunt abdominal injuries including aortic dissection, bladder injury, spleen injury, liver injury and I have considered orthopedic injuries including extremity or spinal injury.  With the patient's presentation of moderate mechanism trauma but an otherwise reassuring exam, patient warrants targeted evaluation for potential traumatic injuries. Will proceed with targeted evaluation for potential injuries. Will proceed with XR left wrist, CTH/cspine. Objective evaluation resulted with left wrist fracture which was splinted as above.  CT head C-spine okay and patient cervical collar was cleared.   Final Reassessment and Plan:   Patient's pain was treated in the emergency department.  Her wrist was reduced and splinted as above with a hematoma block for pain control.  She is in no acute distress at time of discharge with plan to follow-up with hand surgery in the outpatient setting.   Disposition:  I have considered need for hospitalization, however, considering all of the above, I believe this patient is stable for discharge at this time.  Patient/family educated about specific return precautions for given chief complaint and symptoms.  Patient/family educated about follow-up with  PCP/Orthopedics.     Patient/family expressed understanding of return precautions and need for follow-up. Patient spoken to regarding all imaging and laboratory results and appropriate follow up for these results. All education provided in verbal form with additional information in written form. Time was allowed for answering of patient questions. Patient discharged.    Emergency Department Medication Summary:   Medications  oxyCODONE (Oxy IR/ROXICODONE) immediate release tablet 5 mg (has no administration in time range)  lidocaine (PF) (XYLOCAINE) 1 % injection 30 mL (has no administration in time range)          Clinical Impression:  1. Closed fracture of left wrist, initial encounter      Data Unavailable   Final Clinical Impression(s) / ED Diagnoses Final diagnoses:  Closed fracture of left wrist, initial encounter    Rx / DC Orders ED Discharge Orders     None         Glyn Ade, MD 09/16/22 1635

## 2022-09-19 ENCOUNTER — Telehealth: Payer: Self-pay

## 2022-09-19 ENCOUNTER — Ambulatory Visit: Payer: Medicare Other | Admitting: Sports Medicine

## 2022-09-19 VITALS — Ht 61.0 in | Wt 118.0 lb

## 2022-09-19 DIAGNOSIS — S52572A Other intraarticular fracture of lower end of left radius, initial encounter for closed fracture: Secondary | ICD-10-CM

## 2022-09-19 DIAGNOSIS — M79642 Pain in left hand: Secondary | ICD-10-CM | POA: Diagnosis not present

## 2022-09-19 NOTE — Patient Instructions (Addendum)
Good to see you Ortho referral  Tylenol for daily pain  Tramadol for breakthrough pain  Hydrocodone for severe breakthrough pain  As needed follow up

## 2022-09-19 NOTE — Telephone Encounter (Signed)
        Patient  visited [facility] on 09/16/2022  for fall.   Telephone encounter attempt :  1st  A HIPAA compliant voice message was left requesting a return call.  Instructed patient to call back at [phone].   Latysha Thackston [facility]  [facility] Population Health Community Resource Care Guide   [email or web address]  ?? [phone]   Website: [email or web address]

## 2022-09-19 NOTE — Progress Notes (Signed)
Aleen Sells D.[facility] [address] [place] [address] Phone: [phone]   Assessment and Plan:     1. Left hand pain 2. Other closed intra-articular fracture of distal end of left radius, initial encounter -Acute, complicated, initial sports medicine visit - Complicated fracture of distal radius based on comminuted, intra-articular fracture.  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  Patient also had significant degree of swelling in upper extremity distal to left elbow - Based on complicated nature of fracture, I do feel patient would benefit from evaluation by orthopedic surgery. - Patient's splint was removed so that we could further evaluate entirety of patient's arm and skin.  She had 1 small skin rupture over dorsal surface of thumb, though otherwise skin was intact.  Areas of patient's splints were sanded down to be more comfortable and splint was replaced with new gauze and Ace bandages. - Recommend using Tylenol for day-to-day pain relief.  May use tramadol for breakthrough pain.  May use hydrocodone for severe breakthrough pain - No gross deformity, so no repeat x-ray at today's visit.  I assume a orthopedic surgery will want to rex-ray patient prior to evaluation roughly 1 week from injury, so x-ray today was deemed not to be beneficial.   Pertinent previous records reviewed include wrist xray x2 on 09/16/22, ER note 09/16/22   Follow Up: as needed     Subjective:   I, Jerene Canny, am serving as a Neurosurgeon for Doctor Richardean Sale  Chief Complaint: left wrist pain   HPI:   09/19/22 Patient is a 82 year old female complaining of left wrist pain. Patient states fell in the hospital because she fell Monday morning , no numbness or tingling, lots of swelling in hand, she does have a tiny cut on her hand due to the swelling, no meds  for the pain , she  is using oxycodone and tramadol, decreased ROM due to swelling ,    Relevant Historical Information: HTN, MS, RA  Additional pertinent review of systems negative.   Current Outpatient Medications:    atorvastatin (LIPITOR) 10 MG tablet, TAKE 1 TAB ON MONDAYS AND FRIDAY, Disp: 27 tablet, Rfl: 3   Biotin 5000 MCG TABS, Take 10,000 mcg by mouth every evening., Disp: , Rfl:    Carboxymethylcellul-Glycerin (LUBRICATING EYE DROPS OP), Place 1 drop into both eyes daily as needed (dry eyes)., Disp: , Rfl:    carvedilol (COREG) 3.125 MG tablet, TAKE 1 TABLET BY MOUTH TWICE A DAY WITH A MEAL, Disp: 180 tablet, Rfl: 1   cholecalciferol (VITAMIN D) 25 MCG (1000 UNIT) tablet, Take 1,000 Units by mouth in the morning., Disp: , Rfl:    cyclobenzaprine (FLEXERIL) 10 MG tablet, Take 1 tablet (10 mg total) by mouth 3 (three) times daily as needed for muscle spasms., Disp: 90 tablet, Rfl: 2   denosumab (PROLIA) 60 MG/ML SOSY injection, Inject 60 mg into the skin every 6 (six) months. Deliver to rheum: [address], [place], [place] [address]. Appt on 07/23/2022, Disp: 1 mL, Rfl: 0   diclofenac sodium (VOLTAREN) 1 % GEL, APPLY 3 GRAMS TO 3 LARGE JOINTS 3 TIMES A DAY AS NEEDED, Disp: 100 g, Rfl: 0   DULoxetine (CYMBALTA) 20 MG capsule, TAKE 1 CAPSULE BY MOUTH EVERY DAY, Disp: 90 capsule, Rfl: 1   famotidine (PEPCID) 10 MG tablet, Take 10 mg by mouth in the morning., Disp: , Rfl:  folic acid (FOLVITE) 1 MG tablet, Take 1 tablet (1 mg total) by mouth daily., Disp: 90 tablet, Rfl: 3   furosemide (LASIX) 20 MG tablet, TAKE 1 TABLET BY MOUTH EVERY DAY, Disp: 90 tablet, Rfl: 1   linaclotide (LINZESS) 290 MCG CAPS capsule, Take 1 capsule (290 mcg total) by mouth daily before breakfast., Disp: 30 capsule, Rfl: 5   methotrexate (RHEUMATREX) 2.5 MG tablet, TAKE 6 TABLETS (15 MG TOTAL) BY MOUTH ONCE A WEEK. CAUTION:CHEMOTHERAPY. PROTECT FROM LIGHT., Disp: 72 tablet, Rfl: 0   oxyCODONE (ROXICODONE) 5 MG immediate  release tablet, Take 0.5-1 tablets (2.5-5 mg total) by mouth every 4 (four) hours as needed for severe pain., Disp: 12 tablet, Rfl: 0   polyethylene glycol (MIRALAX / GLYCOLAX) 17 g packet, Take 17 g by mouth every other day. In the morning., Disp: , Rfl:    predniSONE (DELTASONE) 5 MG tablet, TAKE 1 TABLET BY MOUTH EVERY DAY IN THE MORNING, Disp: 90 tablet, Rfl: 1   traMADol (ULTRAM) 50 MG tablet, Take 1 tablet (50 mg total) by mouth 3 (three) times daily as needed for severe pain., Disp: 90 tablet, Rfl: 0   TUBERCULIN SYR 1CC/27GX1/2" 27G X 1/2" 1 ML MISC, Use 1 syringe once weekly to inject methotrexate., Disp: 12 each, Rfl: 3   Upadacitinib ER ([name]) 15 MG TB24, Take 15 mg by mouth daily., Disp: , Rfl:    Objective:     Vitals:   09/19/22 1339  Weight: 118 lb (53.5 kg)  Height: 5\' 1"  (1.549 m)      Body mass index is 22.3 kg/m.    Physical Exam:    General: Appears well, nad, nontoxic and pleasant Neuro:sensation intact, strength is 5/5 with df/pf/inv/ev, muscle tone wnl Skin:no susupicious lesions or rashes  Left hand/Wrist:   Significant bruising in hand, into all digits. Bruising along forearm and elbow Skin taught due to swelling Tingling in fingers, with history of cervical neuropathy  Able to move all fingers No gross deformity   Electronically signed by:  Aleen Sells D.[facility] 3:24 PM 09/19/22

## 2022-09-22 ENCOUNTER — Telehealth: Payer: Self-pay | Admitting: Sports Medicine

## 2022-09-22 NOTE — Telephone Encounter (Signed)
Pt called, advised she has not heard from Ortho. Informed pt that I would not have expected to have heard from Ortho as referral just entered Friday.  Pt states the sugar cast is "falling apart", cotton has fallen out and it is abrasive on her elbow. Unsure if we could assist with this issue.

## 2022-09-22 NOTE — Telephone Encounter (Signed)
Pt coming in this afternoon to repair cast.

## 2022-09-23 ENCOUNTER — Telehealth: Payer: Self-pay

## 2022-09-23 NOTE — Telephone Encounter (Signed)
Transition Care Management Follow-up Telephone Call Date of discharge and from where: 09/16/2022 How have you been since you were released from the hospital? Patient is still in  pain.  Any questions or concerns? No  Items Reviewed: Did the pt receive and understand the discharge instructions provided? No  Medications obtained and verified? Yes  Other? No  Any new allergies since your discharge? No  Dietary orders reviewed? Yes Do you have support at home? Yes    Follow up appointments reviewed:  PCP Hospital f/u appt confirmed? Yes  Scheduled to see Richardean Sale, DO on 09/19/2022 @ 1:45 pm. Specialist [facility] f/u appt confirmed?  Patient stated she is waiting on call from Orthopedic Surgeon.  Scheduled to see  on  @ . Are transportation arrangements needed? No  If their condition worsens, is the pt aware to call PCP or go to the Emergency Dept.? Yes Was the patient provided with contact information for the PCP's office or ED? Yes Was to pt encouraged to call back with questions or concerns? Yes  Eain Mullendore [facility]  [facility] Population Health Community Resource Care Guide   [email or web address]  ?? [phone]   Website: [email or web address]  [email or web address]

## 2022-09-29 DIAGNOSIS — S52502A Unspecified fracture of the lower end of left radius, initial encounter for closed fracture: Secondary | ICD-10-CM | POA: Diagnosis not present

## 2022-10-04 ENCOUNTER — Other Ambulatory Visit: Payer: Medicare Other

## 2022-10-06 DIAGNOSIS — S52502A Unspecified fracture of the lower end of left radius, initial encounter for closed fracture: Secondary | ICD-10-CM | POA: Diagnosis not present

## 2022-10-07 DIAGNOSIS — S52572A Other intraarticular fracture of lower end of left radius, initial encounter for closed fracture: Secondary | ICD-10-CM | POA: Diagnosis not present

## 2022-10-07 DIAGNOSIS — X58XXXA Exposure to other specified factors, initial encounter: Secondary | ICD-10-CM | POA: Diagnosis not present

## 2022-10-07 DIAGNOSIS — Y999 Unspecified external cause status: Secondary | ICD-10-CM | POA: Diagnosis not present

## 2022-10-07 DIAGNOSIS — G8918 Other acute postprocedural pain: Secondary | ICD-10-CM | POA: Diagnosis not present

## 2022-10-09 ENCOUNTER — Telehealth: Payer: Self-pay

## 2022-10-09 NOTE — Telephone Encounter (Signed)
Called patient and she advised me that she had surgery on her left wrist due to a fall in the hospital parking lot. Patient follows up with the surgeon on Monday, 10/13/2022. Patient states she is not currently taking rinvoq. Patient was advised she will need clearance from the surgeon before resuming rinvoq. Patient will call us after her appointment on Monday to update [place] and pick up a sample. I advised patient she is also due for labs next week. Patient verbalized understanding.

## 2022-10-09 NOTE — Telephone Encounter (Signed)
Last sample provided: 09/01/2022  Labs: 07/17/2022 CBC stable. Alk phos is slightly low. Rest of CMP WNL.  We will continue to monitor.  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

## 2022-10-09 NOTE — Telephone Encounter (Signed)
yes

## 2022-10-09 NOTE — Telephone Encounter (Signed)
Patient called requesting if she can receive [name]. Please advise.

## 2022-10-21 NOTE — Progress Notes (Unsigned)
Office Visit Note  Patient: Sherri [name]             Date of Birth: Feb 23, 1941           MRN: [id number]             PCP: Pincus Sanes, MD Referring: Pincus Sanes, MD Visit Date: 11/03/2022 Occupation: @GUAROCC @  Subjective:  Pain in both hands  History of Present Illness: [place] is a 82 y.o. female with history of seropositive rheumatoid arthritis.  Patient had a fall on 09/16/2022 at which time she fractured her left wrist.  She has been under the care of Dr. Yehuda Budd and remains in a left wrist splint.  In the future she will require physical therapy.  She has not had any infections or signs of complications.  She has been cleared to resume [name] and methotrexate which have been on hold since the fall/surgery.  She will be following back up with Dr. Yehuda Budd next week. She states that while off of both methotrexate and [name] she has been having increased pain and inflammation in both hands. She denies any recent or recurrent infections.  Her last prolia injection was on 07/24/22.  She continues to take a vitamin D supplement daily.   Activities of Daily Living:  Patient reports morning stiffness for several hours.   Patient Reports nocturnal pain.  Difficulty dressing/grooming: Reports Difficulty climbing stairs: Reports Difficulty getting out of chair: Denies Difficulty using hands for taps, buttons, cutlery, and/or writing: Reports  Review of Systems  Constitutional:  Positive for fatigue.  HENT:  Positive for mouth dryness. Negative for mouth sores.   Eyes:  Positive for dryness.  Respiratory:  Negative for shortness of breath.   Cardiovascular:  Negative for chest pain and palpitations.  Gastrointestinal:  Positive for constipation. Negative for blood in stool and diarrhea.  Endocrine: Negative for increased urination.  Genitourinary:  Negative for involuntary urination.  Musculoskeletal:  Positive for joint pain, gait problem, joint pain, joint swelling,  myalgias, muscle weakness, morning stiffness, muscle tenderness and myalgias.  Skin:  Negative for color change, rash, hair loss and sensitivity to sunlight.  Allergic/Immunologic: Negative for susceptible to infections.  Neurological:  Positive for dizziness and numbness. Negative for headaches.  Hematological:  Negative for swollen glands.  Psychiatric/Behavioral:  Negative for depressed mood and sleep disturbance. The patient is not nervous/anxious.     PMFS History:  Patient Active Problem List   Diagnosis Date Noted   Cervical spondylosis with myelopathy and radiculopathy 04/23/2022   Constipation 03/04/2022   Upper back pain on left side 03/04/2022   Lumbar vertebral fracture, pathologic 09/20/2021   Degenerative spondylolisthesis 08/19/2021   Acute left lumbar radiculopathy 07/05/2021   Closed fracture of rib of right side with delayed healing 05/29/2021   Senile purpura (HCC) 12/19/2020   Restless leg 02/02/2020   Bilateral leg edema 01/11/2020   Cervical radiculopathy at C8 06/28/2019   Carpal tunnel syndrome on both sides 06/28/2019   Right sided sciatica 04/14/2018   Trochanteric bursitis of right hip 04/14/2018   H/O cervical spine surgery 11/10/2017   Dysphagia 10/26/2017   Gastroesophageal reflux disease 08/06/2017   Easy bruising 08/06/2017   Mid back pain 03/26/2017   Submandibular gland swelling 01/08/2017   High risk medication use 09/02/2016   Piriformis muscle pain 09/02/2016   Right hip pain 04/08/2016   Right knee pain 02/25/2016   Numbness and tingling in left hand 11/14/2014   Biceps tendon  tear 10/10/2014   Insomnia 10/10/2014   Gait disturbance 06/13/2014   OP (osteoporosis) 01/18/2014   Other long term (current) drug therapy 01/18/2014   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 01/01/2013   Personal history of colonic polyps 07/03/2010   Nocturia 05/28/2010   THYROID NODULE 05/17/2008   Multiple sclerosis (HCC) 05/17/2008   Other fatigue  03/30/2007   Hyperlipidemia 08/20/2006   Essential hypertension 08/20/2006   Rheumatoid arthritis (HCC) 08/20/2006   CERVICAL CANCER, HX OF 08/20/2006   GASTRIC ULCER, HX OF 08/20/2006    Past Medical History:  Diagnosis Date   Anxiety    takes Valium daily as needed   Basal cell carcinoma 01/21/1988   left nostril (MOHS), sup-right calf (CX35FU)   Basal cell carcinoma 03/22/1996   right calf (CX35FU)   Basal cell carcinoma 03/31/1995   left wing nose   Bruises easily    d/t meds   Cancer (HCC)    basal cell ca, in situ- uterine    Cataracts, bilateral    removed bilateral   Chronic back pain    Dizziness    r/t to meds   GERD (gastroesophageal reflux disease)    no meds on a regular basis but will take Tums if needed   Headache(784.0)    r/t neck issues   History of bronchitis 6-46yrs ago   History of colon polyps    benign   HOH (hard of hearing)    wears hearing aids   Hyperlipidemia    takes Atorvastatin on Mondays and Fridays   Hypertension    Joint pain    Joint swelling    Multiple sclerosis (HCC)    Neuromuscular disorder (HCC)    Dr. [place] Nation- [facility], follows [facility]   Nocturia    PONV (postoperative nausea and vomiting)    trouble urinating after surgery in 2014   Postoperative nausea and vomiting 01/11/2019   Rheumatoid arthritis (HCC)    Dr Glo Herring weekly, RA- hands- knees- feet    Rheumatoid arthritis(714.0)    Dr Corliss Skains takes Harriette Ohara daily   Right wrist fracture    Skin cancer    Squamous cell carcinoma of skin 11/02/2001   in situ-right knee (cx77fu)   Squamous cell carcinoma of skin 11/12/2011   in situ-left forearm (CX35FU), in situ-left foot (CX35FU)   Squamous cell carcinoma of skin 01/22/2012   in situ-left lower forearm (txpbx)   Squamous cell carcinoma of skin 11/17/2012   right shin (txpbx)   Squamous cell carcinoma of skin 10/28/2013   in situ-right shoulder (CX35FU)   Squamous cell carcinoma of skin  04/28/2014   well diff-right shin (txpbx)   Squamous cell carcinoma of skin 11/02/2014   in situ-Left shin (txpbx)   Squamous cell carcinoma of skin 12/15/2014   in situ-Left hand (txpbx), bowens-left side chest (txpbx)   Squamous cell carcinoma of skin 05/09/2015   in situ-left hand (txpbx)    Squamous cell carcinoma of skin 05/07/2016   in situ-left inner shin,ant, in situ-left inner shin, post, in situ-left outer forearm, in situ-right knuckle   Squamous cell carcinoma of skin 03/1302018   in situ-left shoulder (txpbx), in situ-right inner shin (txpbx), in situ-top of left foot (txpbx), KA- left forearm (txpbx)   Squamous cell carcinoma of skin 12/02/2016   in situ-left inner shin (txpbx)   Squamous cell carcinoma of skin 03/04/2017   in situ-left outer sup, shin (txpbx), in situ- right 2nd knuckle finger (txpbx), in situ- Left wrist (  txpbx)   Squamous cell carcinoma of skin 04/06/2018   in situ-above left knee inner (txpbx)   Squamous cell carcinoma of skin 04/21/2018   in situ-right top hand (txpbx)   Squamous cell carcinoma of skin 07/08/2018   in situ-right lower inner shin (txpbx)   Squamous cell carcinoma of skin 04/27/2019   in situ- Left neck(CX35FU), In situ- right neck (CX35FU)   Urinary retention    sees Dr.Wrenn about 2 times a yr    Family History  Problem Relation Age of Onset   Cancer Mother        pancreatic   Diabetes Mother    Pancreatic cancer Mother    Heart disease Father        Rheumatic   Cancer Father        ? stomach   Stomach cancer Father    Cancer Sister        stomach   Stomach cancer Sister    Cancer Maternal Aunt        X 4; ? primary   Heart disease Paternal Aunt    Cancer Maternal Grandmother        cervical   Colon cancer Other        Aunts   Multiple sclerosis Daughter    Hypertension Neg Hx    Stroke Neg Hx    Esophageal cancer Neg Hx    Rectal cancer Neg Hx    Past Surgical History:  Procedure Laterality Date   ABDOMINAL  HYSTERECTOMY     1985   ANTERIOR CERVICAL DECOMP/DISCECTOMY FUSION N/A 04/23/2022   Procedure: Anterior Cervical Decompression/Discectomy Fusion - Cervical Seven-Thoracic One;  Surgeon: Julio Sicks, MD;  Location: [facility] OR;  Service: Neurosurgery;  Laterality: N/A;  3C   APPENDECTOMY     with TAH   BACK SURGERY     several   BREAST BIOPSY Right    Results were negative   BROW LIFT Bilateral 10/02/2016   Procedure: BILATERAL LOWER LID BLEPHAROPLASTY;  Surgeon: Floydene Flock, MD;  Location: [facility] OR;  Service: Plastics;  Laterality: Bilateral;   CARPAL TUNNEL RELEASE Right    cataracts     CERVICAL FUSION     Dr Jeral Fruit   CERVICAL FUSION  12/31/2012   Dr Jeral Fruit   CERVICAL FUSION  04/2022   CHEST TUBE INSERTION     for traumatic Pneumothorax   COLONOSCOPY  07/16/2010   normal    COLONOSCOPY W/ POLYPECTOMY  1997   negative since; Dr Arlyce Dice   ESOPHAGOGASTRODUODENOSCOPY  07/16/2010   normal   eye lid raise     EYE SURGERY Bilateral    cataracts removed - /w IOL   HARDWARE REMOVAL  08/2021   JOINT REPLACEMENT     LUMBAR FUSION     Dr Jeral Fruit   NASAL SINUS SURGERY     POSTERIOR CERVICAL FUSION/FORAMINOTOMY N/A 12/31/2012   Procedure: POSTERIOR LATERAL CERVICAL FUSION/FORAMINOTOMY LEVEL 1 CERVICAL THREE-FOUR WITH LATERAL MASS SCREWS;  Surgeon: Karn Cassis, MD;  Location: [facility];  Service: Neurosurgery;  Laterality: N/A;   PTOSIS REPAIR Bilateral 10/02/2016   Procedure: INTERNAL PTOSIS REPAIR;  Surgeon: Floydene Flock, MD;  Location: [facility] OR;  Service: Plastics;  Laterality: Bilateral;   SKIN BIOPSY     THYROID SURGERY     R lobe removed- 1972, has grown back - CT last done- 2018   TOTAL HIP ARTHROPLASTY  12/22/2011   Procedure: TOTAL HIP ARTHROPLASTY;  Surgeon: Nestor Lewandowsky, MD;  Location: [facility] OR;  Service: Orthopedics;  Laterality: Left;   TOTAL SHOULDER ARTHROPLASTY     TUBAL LIGATION     UPPER GASTROINTESTINAL ENDOSCOPY  2012   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    WRIST SURGERY Left 09/16/2022   wrist/arm   Social History   Social History Narrative   Not on file   Immunization History  Administered Date(s) Administered   Fluad Quad(high Dose 65+) 02/02/2020, 02/01/2022   Influenza Whole 02/22/2007, 02/08/2013   Influenza, High Dose Seasonal PF 02/04/2015, 02/18/2017, 02/21/2018, 01/16/2019, 01/25/2021   Influenza-Unspecified 01/24/2014, 01/25/2016, 02/18/2017, 01/16/2019, 01/25/2020   Moderna Covid-19 Vaccine Bivalent Booster 46yrs & up 02/11/2022   PFIZER(Purple Top)SARS-COV-2 Vaccination 06/18/2019, 07/09/2019, 01/09/2020, 07/03/2020, 02/07/2021   PNEUMOCOCCAL CONJUGATE-20 02/26/2022   Pfizer Covid-19 Vaccine Bivalent Booster 4yrs & up 02/08/2021   Pneumococcal Conjugate-13 11/23/2012, 09/08/2014   Pneumococcal Polysaccharide-23 05/26/2006   Pneumococcal-Unspecified 12/28/2012, 06/09/2020   Respiratory Syncytial Virus Vaccine,Recomb Aduvanted(Arexvy) 01/23/2022   Td 05/28/2010   Tdap 12/24/2020, 01/07/2021   Unspecified SARS-COV-2 Vaccination 06/26/2020, 02/11/2022   Varicella 11/07/2020   Zoster Recombinat (Shingrix) 02/10/2019, 04/14/2019   Zoster, Live 05/26/2009, 07/10/2020     Objective: Vital Signs: BP 114/74 (BP Location: Right Arm, Patient Position: Sitting, Cuff Size: Normal)   Pulse 76   Resp 14   Ht 5' (1.524 m)   Wt 118 lb (53.5 kg)   BMI 23.05 kg/m    Physical Exam Vitals and nursing note reviewed.  Constitutional:      Appearance: She is well-developed.  HENT:     Head: Normocephalic and atraumatic.  Eyes:     Conjunctiva/sclera: Conjunctivae normal.  Cardiovascular:     Rate and Rhythm: Normal rate and regular rhythm.     Heart sounds: Normal heart sounds.  Pulmonary:     Effort: Pulmonary effort is normal.     Breath sounds: Normal breath sounds.  Abdominal:     General: Bowel sounds are normal.     Palpations: Abdomen is soft.  Musculoskeletal:     Cervical back: Normal range of motion.   Lymphadenopathy:     Cervical: No cervical adenopathy.  Skin:    General: Skin is warm and dry.     Capillary Refill: Capillary refill takes less than 2 seconds.  Neurological:     Mental Status: She is alert and oriented to person, place, and time.  Psychiatric:        Behavior: Behavior normal.      Musculoskeletal Exam: C-spine has limited range of motion.  Thoracic FXS.  Painful range of motion of the lumbar spine.  Shoulder joints and elbow joints have good range of motion.  Left wrist is in a splint.  Synovial thickening of all MCP joints.  Tenderness and inflammation in the right fifth PIP and left fourth PIP joint.  PIP and DIP thickening consistent with osteoarthritis of both hands noted.  Left hip replacement has good range of motion.  Knee joints have good range of motion with no warmth or effusion.  Ankle joints have good range of motion with no tenderness or joint swelling.  CDAI Exam: CDAI Score: 5  Patient Global: 5 mm; Provider Global: 5 mm Swollen: 2 ; Tender: 2  Joint Exam 11/03/2022      Right  Left  PIP 4     Swollen Tender  PIP 5  Swollen Tender        Investigation: No additional findings.  Imaging: No results found.  Recent Labs:  Lab Results  Component Value Date   WBC 4.6 07/17/2022   HGB 12.0 07/17/2022   PLT 323 07/17/2022   NA 136 07/17/2022   K 5.1 07/17/2022   CL 99 07/17/2022   CO2 29 07/17/2022   GLUCOSE 90 07/17/2022   BUN 13 07/17/2022   CREATININE 0.88 07/17/2022   BILITOT 0.5 07/17/2022   ALKPHOS 29 (L) 04/10/2020   AST 29 07/17/2022   ALT 18 07/17/2022   PROT 6.6 07/17/2022   ALBUMIN 4.2 04/10/2020   CALCIUM 9.4 07/17/2022   GFRAA 65 09/26/2020   QFTBGOLDPLUS NEGATIVE 12/12/2021    Speciality Comments: Prior therapy includes: Harriette Ohara (cost), Olumiant-inadequate response, Imuran weakness, leflunomide-intolerance Prolia: 05/04/18, 11/12/18, 06/09/19, 12/07/19, 07/05/20, 01/01/21, 07/09/21, 01/09/22, 07/24/22  Procedures:  No  procedures performed Allergies: Darvon [propoxyphene hcl], Demerol [meperidine], Penicillins, Imuran [azathioprine], Sulfa antibiotics, Arava [leflunomide], Lipitor [atorvastatin], and Vytorin [ezetimibe-simvastatin]      Assessment / Plan:     Visit Diagnoses: Rheumatoid arthritis involving multiple sites with positive rheumatoid factor [facility]): Patient presents today with increased pain and inflammation in both hands.  She has tenderness and synovitis in the right fifth PIP and the left fourth PIP joint.  She has been off both [name] and methotrexate since a fall on 09/16/2022 at which time she fractured her left wrist.  She has been under the care of Dr. Yehuda Budd and has recovered from surgery without complication.  She has been cleared to restart both immunosuppressive agents.  A prescription sample of [name] was provided to the patient today.  She will remain on methotrexate 6 tablets once weekly along with folic acid 1 mg daily and prednisone 5 mg daily.  She was advised to notify us if her symptoms do not improve after resuming these medications as prescribed.  She will follow-up in the office in 3 months or sooner if needed.  High risk medication use - [name] 15 mg 1 tablet by mouth daily, Methotrexate 6 tablets weekly, folic acid 1 mg by mouth daily, prednisone 5 mg 1 tablet by mouth daily. CBC and CMP updated on 07/17/22.  TB gold negative on 12/12/21. Discussed the importance of holding rinvoq and methotrexate if she develops signs or symptoms of an infection and to resume once the infection has completely cleared.  - Plan: CBC with Differential/Platelet, COMPLETE METABOLIC PANEL WITH GFR, QuantiFERON-TB Gold Plus  Screening for tuberculosis - Order for TB gold released today. Plan: QuantiFERON-TB Gold Plus  Primary osteoarthritis of right hip: Not currently symptomatic.   History of total hip replacement, left - Dr. Rowan-Chronic pain.  Patient has decided against using a cane or walker.    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 - She underwent an uncomplicated C7-T1 anterior cervical discectomy and fusion on 04/23/2022 performed by Dr. Jordan Likes. Limited ROM.   DDD (degenerative disc disease), lumbar - Lumbar spine surgery in January 2023 x2 by Dr. Jordan Likes. Limited mobility.   Age-related osteoporosis without current pathological fracture - DEXA 04/14/22: RFN T-score -3.2. Current treatment-prolia 60 mg sq injections every 6 months-05/04/18, 11/12/18, 06/09/19, 12/07/19, 07/05/20, 01/01/2021, 07/09/2021, 01/09/2022, and 07/24/22. Recurrent falls and history of multiple fractures---while on prolia.  History of bilateral wrist fractures-left wrist most recent after a fall on 09/16/2022.History of L4 vertebral fracture. T7 and T8 compression fractures-April 2022. History of rib fractures-November 2022. She remains on long term prednisone 5 mg daily.  Plan to apply for Forteo daily injections as combination therapy.  Indications, contraindications, potential side effects of Forteo were discussed today.  Plan on applying for Forteo through her insurance.  Due to update DEXA in November 2025.   Closed fracture of right wrist with routine healing, subsequent encounter-History of.   Closed fracture of left wrist with routine healing, subsequent encounter: Fall 09/16/22--Under care of Dr. Yehuda Budd.  Applying for forteo daily injections.    Vitamin D deficiency -She is taking vitamin D 1000 units daily.  Vitamin D level will be checked today.  Plan: VITAMIN D 25 Hydroxy (Vit-D Deficiency, Fractures)  Other medical conditions are listed as follows:  Other fatigue  Primary insomnia  History of hypertension: Blood pressure was 114/74 today in the office.  History of multiple sclerosis (HCC)  History of depression  History of anxiety  History of cellulitis  History of hyperlipidemia   Orders: Orders Placed This Encounter  Procedures   CBC with Differential/Platelet   COMPLETE METABOLIC  PANEL WITH GFR   QuantiFERON-TB Gold Plus   VITAMIN D 25 Hydroxy (Vit-D Deficiency, Fractures)   No orders of the defined types were placed in this encounter.   Follow-Up Instructions: Return in about 3 months (around 02/03/2023) for Rheumatoid arthritis.   Gearldine Bienenstock, PA-C  Note - This record has been created using Dragon software.  Chart creation errors have been sought, but may not always  have been located. Such creation errors do not reflect on  the standard of medical care.

## 2022-10-22 DIAGNOSIS — S52502D Unspecified fracture of the lower end of left radius, subsequent encounter for closed fracture with routine healing: Secondary | ICD-10-CM | POA: Diagnosis not present

## 2022-10-25 ENCOUNTER — Other Ambulatory Visit: Payer: Self-pay | Admitting: Internal Medicine

## 2022-10-29 ENCOUNTER — Telehealth: Payer: Self-pay

## 2022-10-29 NOTE — Telephone Encounter (Signed)
Patient advised her labs were normal in February 2024. Patient advised if she has clearance from her surgeon she can go back on [name]. Patient advised Dr. Corliss Skains would recommend getting labs now and then in a month after starting [name]. Patient expressed understanding.

## 2022-10-29 NOTE — Telephone Encounter (Signed)
Patient contacted the office stating she would like to go back on [name]. Patient states she fell in April this year and has been off of [name] since then. Patient states she had surgery on her left wrist and hand on 09/16/2022. Patient states her hands hurt really bad and she feels they are getting crooked. Patient did not give me a pharmacy and states she usually gets samples. Patient call back number is [phone]. Please advise.

## 2022-10-29 NOTE — Telephone Encounter (Signed)
Her labs were normal in February 2024.  If she has clearance from her surgeon she can go back on [name].  I would recommend getting labs now and then in a month after starting [name].

## 2022-11-03 ENCOUNTER — Encounter: Payer: Self-pay | Admitting: Physician Assistant

## 2022-11-03 ENCOUNTER — Ambulatory Visit: Payer: Medicare Other | Attending: Physician Assistant | Admitting: Physician Assistant

## 2022-11-03 ENCOUNTER — Ambulatory Visit: Payer: Medicare Other | Admitting: Physician Assistant

## 2022-11-03 ENCOUNTER — Telehealth: Payer: Self-pay | Admitting: Pharmacist

## 2022-11-03 VITALS — BP 114/74 | HR 76 | Resp 14 | Ht 60.0 in | Wt 118.0 lb

## 2022-11-03 DIAGNOSIS — M1611 Unilateral primary osteoarthritis, right hip: Secondary | ICD-10-CM

## 2022-11-03 DIAGNOSIS — M0579 Rheumatoid arthritis with rheumatoid factor of multiple sites without organ or systems involvement: Secondary | ICD-10-CM | POA: Diagnosis not present

## 2022-11-03 DIAGNOSIS — M4312 Spondylolisthesis, cervical region: Secondary | ICD-10-CM | POA: Diagnosis not present

## 2022-11-03 DIAGNOSIS — Z96642 Presence of left artificial hip joint: Secondary | ICD-10-CM | POA: Diagnosis not present

## 2022-11-03 DIAGNOSIS — Z111 Encounter for screening for respiratory tuberculosis: Secondary | ICD-10-CM

## 2022-11-03 DIAGNOSIS — G35 Multiple sclerosis: Secondary | ICD-10-CM

## 2022-11-03 DIAGNOSIS — E559 Vitamin D deficiency, unspecified: Secondary | ICD-10-CM | POA: Diagnosis not present

## 2022-11-03 DIAGNOSIS — M810 Age-related osteoporosis without current pathological fracture: Secondary | ICD-10-CM

## 2022-11-03 DIAGNOSIS — Z8659 Personal history of other mental and behavioral disorders: Secondary | ICD-10-CM

## 2022-11-03 DIAGNOSIS — R5383 Other fatigue: Secondary | ICD-10-CM | POA: Diagnosis not present

## 2022-11-03 DIAGNOSIS — F5101 Primary insomnia: Secondary | ICD-10-CM

## 2022-11-03 DIAGNOSIS — Z8679 Personal history of other diseases of the circulatory system: Secondary | ICD-10-CM

## 2022-11-03 DIAGNOSIS — Z8639 Personal history of other endocrine, nutritional and metabolic disease: Secondary | ICD-10-CM

## 2022-11-03 DIAGNOSIS — Z79899 Other long term (current) drug therapy: Secondary | ICD-10-CM | POA: Diagnosis not present

## 2022-11-03 DIAGNOSIS — M5136 Other intervertebral disc degeneration, lumbar region: Secondary | ICD-10-CM | POA: Diagnosis not present

## 2022-11-03 DIAGNOSIS — S62101D Fracture of unspecified carpal bone, right wrist, subsequent encounter for fracture with routine healing: Secondary | ICD-10-CM | POA: Diagnosis not present

## 2022-11-03 DIAGNOSIS — S62102D Fracture of unspecified carpal bone, left wrist, subsequent encounter for fracture with routine healing: Secondary | ICD-10-CM

## 2022-11-03 DIAGNOSIS — Z872 Personal history of diseases of the skin and subcutaneous tissue: Secondary | ICD-10-CM

## 2022-11-03 LAB — CBC WITH DIFFERENTIAL/PLATELET
HCT: 36 % (ref 35.0–45.0)
Hemoglobin: 12.3 g/dL (ref 11.7–15.5)
Lymphs Abs: 885 cells/uL (ref 850–3900)
RDW: 13 % (ref 11.0–15.0)
Total Lymphocyte: 16.7 %

## 2022-11-03 NOTE — Patient Instructions (Signed)
Teriparatide Injection What is this medication? TERIPARATIDE (terr ih PAR a tyd) treats osteoporosis. It works by making your bones stronger and less likely to break (fracture). This medicine may be used for other purposes; ask your health care provider or pharmacist if you have questions. COMMON BRAND NAME(S): FORTEO What should I tell my care team before I take this medication? They need to know if you have any of these conditions: Bone disease other than osteoporosis High levels of calcium in the blood History of cancer in the bone Kidney stone Paget's disease Parathyroid disease Receiving radiation therapy An unusual or allergic reaction to teriparatide, other medications, foods, dyes, or preservatives Pregnant or trying to get pregnant Breast-feeding How should I use this medication? This medication is injected under the skin. You will be taught how to prepare and give it. Take it as directed on the prescription label at the same time every day. Keep taking it unless your care team tells you to stop. This medication comes with INSTRUCTIONS FOR USE. Ask your pharmacist for directions on how to use this medication. Read the information carefully. Talk to your pharmacist or care team if you have questions. It is important that you put your used needles and pens in a special sharps container. Do not put them in a trash can. If you do not have a sharps container, call your pharmacist or care team to get one. A special MedGuide will be given to you by the pharmacist with each prescription and refill. Be sure to read this information carefully each time. Talk to your care team about the use of this medication in children. Special care may be needed. Overdosage: If you think you have taken too much of this medicine contact a poison control center or emergency room at once. NOTE: This medicine is only for you. Do not share this medicine with others. What if I miss a dose? If you miss a dose,  take it as soon as you can. If it is almost time for your next dose, take only that dose. Do not take double or extra doses. What may interact with this medication? Digoxin This list may not describe all possible interactions. Give your health care provider a list of all the medicines, herbs, non-prescription drugs, or dietary supplements you use. Also tell them if you smoke, drink alcohol, or use illegal drugs. Some items may interact with your medicine. What should I watch for while using this medication? Visit your care team for regular checks on your progress. You may need blood work while you are taking this medication. You should make sure you get enough calcium and vitamin D while you are taking this medication. Discuss the foods you eat and the vitamins you take with your care team. This medication may affect your coordination, reaction time, or judgment. Do not drive or operate machinery until you know how this medication affects you. Sit up or stand slowly to reduce the risk of dizzy or fainting spells. Drinking alcohol with this medication can increase the risk of these side effects. Talk to your care team about your risk of cancer. You may be more at risk for certain types of cancers if you take this medication. What side effects may I notice from receiving this medication? Side effects that you should report to your care team as soon as possible: Allergic reactions--skin rash, itching, hives, swelling of the face, lips, tongue, or throat High calcium level--increased thirst or amount of urine, nausea, vomiting, confusion, unusual   weakness or fatigue, bone pain Kidney stones--blood in the urine, pain or trouble passing urine, pain in the lower back or sides Low blood pressure--dizziness, feeling faint or lightheaded, blurry vision Side effects that usually do not require medical attention (report these to your care team if they continue or are bothersome): Dizziness Headache Joint  pain Nausea Pain, redness, or irritation at injection site This list may not describe all possible side effects. Call your doctor for medical advice about side effects. You may report side effects to FDA at 1-800-FDA-1088. Where should I keep my medication? Keep out of the reach of children and pets. Store the pens in the refrigerator. Do not freeze. Use the pen quickly after taking out of the refrigerator and recap and return to refrigerator right after using. Protect from light. Get rid of any unused medication 28 days after the first injection from the pen. Get rid of any unopened, unused medication after the expiration date on the label. To get rid of medications that are no longer needed or have expired: Take the medication to a medication take-back program. Check with your pharmacy or law enforcement to find a location. If you cannot return the medication, ask your pharmacist or care team how to get rid of this medication safely. NOTE: This sheet is a summary. It may not cover all possible information. If you have questions about this medicine, talk to your doctor, pharmacist, or health care provider.  2024 Elsevier/Gold Standard (2021-08-21 00:00:00)  

## 2022-11-03 NOTE — Telephone Encounter (Addendum)
Submitted a Prior Authorization request to [facility] for FORTEO via CoverMyMeds. Will update once we receive a response.  Key: [id number]  Chesley Mires, PharmD, MPH, BCPS, CPP Clinical Pharmacist (Rheumatology and Pulmonology)  ----- Message from Ellen Henri, CMA sent at 11/03/2022  1:41 PM EDT ----- Please apply for forteo per Sherron Ales, PA-C. Thanks!

## 2022-11-04 ENCOUNTER — Telehealth: Payer: Self-pay | Admitting: *Deleted

## 2022-11-04 LAB — COMPLETE METABOLIC PANEL WITH GFR
ALT: 10 U/L (ref 6–29)
AST: 22 U/L (ref 10–35)
Albumin: 4 g/dL (ref 3.6–5.1)
Alkaline phosphatase (APISO): 50 U/L (ref 37–153)
Creat: 0.75 mg/dL (ref 0.60–0.95)
Globulin: 2.7 g/dL (calc) (ref 1.9–3.7)
Glucose, Bld: 104 mg/dL — ABNORMAL HIGH (ref 65–99)
Sodium: 135 mmol/L (ref 135–146)
eGFR: 80 mL/min/{1.73_m2} (ref >=60)

## 2022-11-04 LAB — CBC WITH DIFFERENTIAL/PLATELET
Absolute Monocytes: 482 cells/uL (ref 200–950)
Eosinophils Absolute: 21 cells/uL (ref 15–500)
MCV: 100.3 fL — ABNORMAL HIGH (ref 80.0–100.0)
Neutro Abs: 3890 cells/uL (ref 1500–7800)
RBC: 3.59 10*6/uL — ABNORMAL LOW (ref 3.80–5.10)

## 2022-11-04 NOTE — Progress Notes (Signed)
Vitamin D WNL CBC and CMP stable.

## 2022-11-04 NOTE — Telephone Encounter (Signed)
Medication Samples have been provided to the patient.  Drug name: [name]       Strength: 15 mg        Qty: 2  LOT: [id number]    Exp.Date: 12/12/2023  Dosing instructions: Take one tablet by mouth daily.

## 2022-11-05 LAB — CBC WITH DIFFERENTIAL/PLATELET
Basophils Absolute: 21 cells/uL (ref 0–200)
Basophils Relative: 0.4 %
Eosinophils Relative: 0.4 %
MCH: 34.3 pg — ABNORMAL HIGH (ref 27.0–33.0)
MCHC: 34.2 g/dL (ref 32.0–36.0)
MPV: 9.4 fL (ref 7.5–12.5)
Monocytes Relative: 9.1 %
Neutrophils Relative %: 73.4 %
Platelets: 355 10*3/uL (ref 140–400)
WBC: 5.3 10*3/uL (ref 3.8–10.8)

## 2022-11-05 LAB — COMPLETE METABOLIC PANEL WITH GFR
AG Ratio: 1.5 (calc) (ref 1.0–2.5)
BUN: 9 mg/dL (ref 7–25)
CO2: 28 mmol/L (ref 20–32)
Calcium: 9.4 mg/dL (ref 8.6–10.4)
Chloride: 101 mmol/L (ref 98–110)
Potassium: 5.1 mmol/L (ref 3.5–5.3)
Total Bilirubin: 0.4 mg/dL (ref 0.2–1.2)
Total Protein: 6.7 g/dL (ref 6.1–8.1)

## 2022-11-05 LAB — QUANTIFERON-TB GOLD PLUS
Mitogen-NIL: 3.31 IU/mL
NIL: 0.04 IU/mL
QuantiFERON-TB Gold Plus: NEGATIVE
TB1-NIL: 0 IU/mL
TB2-NIL: 0 IU/mL

## 2022-11-05 LAB — VITAMIN D 25 HYDROXY (VIT D DEFICIENCY, FRACTURES): Vit D, 25-Hydroxy: 46 ng/mL (ref 30–100)

## 2022-11-06 NOTE — Progress Notes (Signed)
TB gold negative

## 2022-11-07 ENCOUNTER — Other Ambulatory Visit (HOSPITAL_COMMUNITY): Payer: Self-pay

## 2022-11-07 NOTE — Telephone Encounter (Signed)
Received notification from [facility] regarding a prior authorization for FORTEO. Authorization has been APPROVED from 11/03/2022 to 05/26/23. Approval letter sent to scan center.  Per test claim, copay for 28 days supply is $1185.62.   Patient can fill through [facility]: [phone]   Authorization # [id number] Phone # [phone]  If patient's household income for 2 people is below [phone], we can apply for Forteo pt assistance program through [employer].   Chesley Mires, PharmD, MPH, BCPS, CPP Clinical Pharmacist (Rheumatology and Pulmonology)

## 2022-11-09 ENCOUNTER — Other Ambulatory Visit: Payer: Self-pay | Admitting: Physician Assistant

## 2022-11-10 NOTE — Telephone Encounter (Signed)
Spoke to patient about Forteo copay. Offered to apply for [employer] PAP, she stated that she was not interested in applying due to exceeding financial criteria. We reviewed possibly pursuing appeal process through [employer], however patient was not interested in this either.   Next Prolia is due 01/20/2023

## 2022-11-10 NOTE — Telephone Encounter (Signed)
Last Fill: 08/14/2022  Labs: 11/03/2022 CBC and CMP stable.   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

## 2022-11-12 DIAGNOSIS — S52502D Unspecified fracture of the lower end of left radius, subsequent encounter for closed fracture with routine healing: Secondary | ICD-10-CM | POA: Diagnosis not present

## 2022-11-17 ENCOUNTER — Telehealth: Payer: Self-pay | Admitting: Internal Medicine

## 2022-11-17 NOTE — Telephone Encounter (Signed)
Patient requests to be called re: Physical Therapy on left hand

## 2022-11-19 DIAGNOSIS — M4310 Spondylolisthesis, site unspecified: Secondary | ICD-10-CM | POA: Diagnosis not present

## 2022-11-25 DIAGNOSIS — M25632 Stiffness of left wrist, not elsewhere classified: Secondary | ICD-10-CM | POA: Diagnosis not present

## 2022-11-25 DIAGNOSIS — S52502D Unspecified fracture of the lower end of left radius, subsequent encounter for closed fracture with routine healing: Secondary | ICD-10-CM | POA: Diagnosis not present

## 2022-12-03 ENCOUNTER — Ambulatory Visit: Payer: Medicare Other | Admitting: Family Medicine

## 2022-12-04 ENCOUNTER — Telehealth: Payer: Self-pay

## 2022-12-04 DIAGNOSIS — S52502D Unspecified fracture of the lower end of left radius, subsequent encounter for closed fracture with routine healing: Secondary | ICD-10-CM | POA: Diagnosis not present

## 2022-12-04 DIAGNOSIS — M25632 Stiffness of left wrist, not elsewhere classified: Secondary | ICD-10-CM | POA: Diagnosis not present

## 2022-12-04 NOTE — Telephone Encounter (Signed)
Medication Samples have been provided to the patient.  Drug name: [name]       Strength: 15 mg        Qty: 1  LOT: [id number]  Exp.Date: 10/19/2023  Dosing instructions: Take 15 mg by mouth daily.   Medication Samples have been provided to the patient.  Drug name: [name]       Strength: 15 mg         Qty: 1  LOT: [id number]  Exp.Date: 10/12/2023  Dosing instructions: Take 15 mg by mouth daily.

## 2022-12-04 NOTE — Telephone Encounter (Signed)
Patient called requesting rinvoq samples.   Last sample: 11/04/2022  Labs: 11/03/2022 Vitamin D WNL CBC and CMP stable.  TB Gold: 11/03/2022 negative    Next Visit: 02/18/2023  Last Visit: 11/03/2022  VH:QIONGEXBMW arthritis involving multiple sites with positive rheumatoid factor   Current Dose per office note on 11/03/2022: [name] 15 mg 1 tablet by mouth daily   Okay to provide rinvoq sample?

## 2022-12-04 NOTE — Telephone Encounter (Signed)
yes

## 2022-12-12 DIAGNOSIS — M25632 Stiffness of left wrist, not elsewhere classified: Secondary | ICD-10-CM | POA: Diagnosis not present

## 2022-12-12 DIAGNOSIS — S52502D Unspecified fracture of the lower end of left radius, subsequent encounter for closed fracture with routine healing: Secondary | ICD-10-CM | POA: Diagnosis not present

## 2022-12-18 DIAGNOSIS — S52502D Unspecified fracture of the lower end of left radius, subsequent encounter for closed fracture with routine healing: Secondary | ICD-10-CM | POA: Diagnosis not present

## 2022-12-18 NOTE — Progress Notes (Signed)
[facility] [address] [place] [address] Phone: [phone] Subjective:   Bruce Donath, am serving as a scribe for Dr. Antoine Primas.  I'm seeing this patient by the request  of:  Pincus Sanes, MD  CC: cymbalta   BJY:[id number]  08/28/2022 Patient seems to be making some improvement at this time. I believe that patient's strength is significantly improved. Patient feels though that the physical therapy is too aggressive and patient is going to take another 2 weeks I encourage patient that I do think that this will be more beneficial. Does not feel that the Cymbalta has been as helpful as previously. Patient is concerned it may be interacting with other medications that she has been told she states by a pharmacist. We discussed that there is some not likely a side effect but patient is having urinary retention and is seen multiple providers including a urologist. Encourage patient to continue to follow-up with neurosurgery as well. Patient states that she still has some follow-up is hopeful that she will continue to improve.   Questionable cause patient urinary retention   Updated 12/24/2022 [place] A Taff is a 82 y.o. female coming in with complaint of neck pain. Patient states that she has had good and bad days. Plate put in L wrist May. Too much activity will increase her pain. Continues to take Cymbalta which is helpful.   New complaint: Pain L groin. Feels like leg is weak. Symptoms occurring for past 2 years. 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    Since we have seen patient patient did have a fall and a fracture of the left wrist.  Seen another provider and was referred to orthopedics.  In addition to this has had close follow-up with rheumatology for osteoporosis and rheumatoid arthritis.  Recent laboratory workup in June showed that patient labs are stable.    Past Medical History:  Diagnosis Date   Anxiety    takes Valium daily as  needed   Basal cell carcinoma 01/21/1988   left nostril (MOHS), sup-right calf (CX35FU)   Basal cell carcinoma 03/22/1996   right calf (CX35FU)   Basal cell carcinoma 03/31/1995   left wing nose   Bruises easily    d/t meds   Cancer (HCC)    basal cell ca, in situ- uterine    Cataracts, bilateral    removed bilateral   Chronic back pain    Dizziness    r/t to meds   GERD (gastroesophageal reflux disease)    no meds on a regular basis but will take Tums if needed   Headache(784.0)    r/t neck issues   History of bronchitis 6-10yrs ago   History of colon polyps    benign   HOH (hard of hearing)    wears hearing aids   Hyperlipidemia    takes Atorvastatin on Mondays and Fridays   Hypertension    Joint pain    Joint swelling    Multiple sclerosis (HCC)    Neuromuscular disorder (HCC)    Dr. Fruitland Nation- [facility], follows [facility]   Nocturia    PONV (postoperative nausea and vomiting)    trouble urinating after surgery in 2014   Postoperative nausea and vomiting 01/11/2019   Rheumatoid arthritis (HCC)    Dr Glo Herring weekly, RA- hands- knees- feet    Rheumatoid arthritis(714.0)    Dr Corliss Skains takes Harriette Ohara daily   Right wrist fracture    Skin  cancer    Squamous cell carcinoma of skin 11/02/2001   in situ-right knee (cx23fu)   Squamous cell carcinoma of skin 11/12/2011   in situ-left forearm (CX35FU), in situ-left foot (CX35FU)   Squamous cell carcinoma of skin 01/22/2012   in situ-left lower forearm (txpbx)   Squamous cell carcinoma of skin 11/17/2012   right shin (txpbx)   Squamous cell carcinoma of skin 10/28/2013   in situ-right shoulder (CX35FU)   Squamous cell carcinoma of skin 04/28/2014   well diff-right shin (txpbx)   Squamous cell carcinoma of skin 11/02/2014   in situ-Left shin (txpbx)   Squamous cell carcinoma of skin 12/15/2014   in situ-Left hand (txpbx), bowens-left side chest (txpbx)   Squamous cell carcinoma of skin 05/09/2015   in  situ-left hand (txpbx)    Squamous cell carcinoma of skin 05/07/2016   in situ-left inner shin,ant, in situ-left inner shin, post, in situ-left outer forearm, in situ-right knuckle   Squamous cell carcinoma of skin 03/1302018   in situ-left shoulder (txpbx), in situ-right inner shin (txpbx), in situ-top of left foot (txpbx), KA- left forearm (txpbx)   Squamous cell carcinoma of skin 12/02/2016   in situ-left inner shin (txpbx)   Squamous cell carcinoma of skin 03/04/2017   in situ-left outer sup, shin (txpbx), in situ- right 2nd knuckle finger (txpbx), in situ- Left wrist (txpbx)   Squamous cell carcinoma of skin 04/06/2018   in situ-above left knee inner (txpbx)   Squamous cell carcinoma of skin 04/21/2018   in situ-right top hand (txpbx)   Squamous cell carcinoma of skin 07/08/2018   in situ-right lower inner shin (txpbx)   Squamous cell carcinoma of skin 04/27/2019   in situ- Left neck(CX35FU), In situ- right neck (CX35FU)   Urinary retention    sees Dr.Wrenn about 2 times a yr   Past Surgical History:  Procedure Laterality Date   ABDOMINAL HYSTERECTOMY     1985   ANTERIOR CERVICAL DECOMP/DISCECTOMY FUSION N/A 04/23/2022   Procedure: Anterior Cervical Decompression/Discectomy Fusion - Cervical Seven-Thoracic One;  Surgeon: Julio Sicks, MD;  Location: [facility] OR;  Service: Neurosurgery;  Laterality: N/A;  3C   APPENDECTOMY     with TAH   BACK SURGERY     several   BREAST BIOPSY Right    Results were negative   BROW LIFT Bilateral 10/02/2016   Procedure: BILATERAL LOWER LID BLEPHAROPLASTY;  Surgeon: Floydene Flock, MD;  Location: [facility] OR;  Service: Plastics;  Laterality: Bilateral;   CARPAL TUNNEL RELEASE Right    cataracts     CERVICAL FUSION     Dr Jeral Fruit   CERVICAL FUSION  12/31/2012   Dr Jeral Fruit   CERVICAL FUSION  04/2022   CHEST TUBE INSERTION     for traumatic Pneumothorax   COLONOSCOPY  07/16/2010   normal    COLONOSCOPY W/ POLYPECTOMY  1997   negative since; Dr  Arlyce Dice   ESOPHAGOGASTRODUODENOSCOPY  07/16/2010   normal   eye lid raise     EYE SURGERY Bilateral    cataracts removed - /w IOL   HARDWARE REMOVAL  08/2021   JOINT REPLACEMENT     LUMBAR FUSION     Dr Jeral Fruit   NASAL SINUS SURGERY     POSTERIOR CERVICAL FUSION/FORAMINOTOMY N/A 12/31/2012   Procedure: POSTERIOR LATERAL CERVICAL FUSION/FORAMINOTOMY LEVEL 1 CERVICAL THREE-FOUR WITH LATERAL MASS SCREWS;  Surgeon: Karn Cassis, MD;  Location: [facility];  Service: Neurosurgery;  Laterality: N/A;   PTOSIS  REPAIR Bilateral 10/02/2016   Procedure: INTERNAL PTOSIS REPAIR;  Surgeon: Floydene Flock, MD;  Location: [facility] OR;  Service: Plastics;  Laterality: Bilateral;   SKIN BIOPSY     THYROID SURGERY     R lobe removed- 1972, has grown back - CT last done- 2018   TOTAL HIP ARTHROPLASTY  12/22/2011   Procedure: TOTAL HIP ARTHROPLASTY;  Surgeon: Nestor Lewandowsky, MD;  Location: [facility] OR;  Service: Orthopedics;  Laterality: Left;   TOTAL SHOULDER ARTHROPLASTY     TUBAL LIGATION     UPPER GASTROINTESTINAL ENDOSCOPY  2012   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   WRIST SURGERY Left 09/16/2022   wrist/arm   Social History   Socioeconomic History   Marital status: Married    Spouse name: Amada Jupiter   Number of children: 2   Years of education: Not on file   Highest education level: Not on file  Occupational History   Occupation: retired  Tobacco Use   Smoking status: Former    Current packs/day: 0.00    Average packs/day: 2.5 packs/day for 20.0 years (50.0 ttl pk-yrs)    Types: Cigarettes    Start date: 05/26/1961    Quit date: 05/26/1981    Years since quitting: 41.6    Passive exposure: Never   Smokeless tobacco: Never   Tobacco comments:    Smoked [phone], up to 2.5 ppd  Vaping Use   Vaping status: Never Used  Substance and Sexual Activity   Alcohol use: Yes    Alcohol/week: 14.0 standard drinks of alcohol    Types: 14 Cans of beer per week    Comment: 2% beer   Drug use: Never    Sexual activity: Yes    Birth control/protection: Surgical    Comment: Hysterectomy  Other Topics Concern   Not on file  Social History Narrative   Not on file   Social Determinants of Health   Financial Resource Strain: Low Risk  (02/21/2022)   Overall Financial Resource Strain (CARDIA)    Difficulty of Paying Living Expenses: Not hard at all  Food Insecurity: No Food Insecurity (04/25/2022)   Hunger Vital Sign    Worried About Running Out of Food in the Last Year: Never true    Ran Out of Food in the Last Year: Never true  Transportation Needs: No Transportation Needs (04/25/2022)   PRAPARE - Administrator, Civil Service (Medical): No    Lack of Transportation (Non-Medical): No  Physical Activity: Inactive (02/21/2022)   Exercise Vital Sign    Days of Exercise per Week: 0 days    Minutes of Exercise per Session: 0 min  Stress: Stress Concern Present (02/21/2022)   [employer] of Occupational Health - Occupational Stress Questionnaire    Feeling of Stress : Rather much  Social Connections: Moderately Isolated (02/21/2022)   Social Connection and Isolation Panel [NHANES]    Frequency of Communication with Friends and Family: More than three times a week    Frequency of Social Gatherings with Friends and Family: Twice a week    Attends Religious Services: Never    Database administrator or Organizations: No    Attends Banker Meetings: Never    Marital Status: Married   Allergies  Allergen Reactions   Darvon [Propoxyphene Hcl] Shortness Of Breath    ? Dose Related ? Lowered respirations greatly   Demerol [Meperidine] Shortness Of Breath and Other (See Comments)    Respiratory Distress  Penicillins Hives and Other (See Comments)    Has patient had a PCN reaction causing immediate rash, facial/tongue/throat swelling, SOB or lightheadedness with hypotension: No SEVERE RASH INVOLVING MUCUS MEMBRANES or SKIN NECROSIS: #  #  #  YES  #  #  #  Has  patient had a PCN reaction that required hospitalization No Has patient had a PCN reaction occurring within the last 10 years: No.    Imuran [Azathioprine] Other (See Comments)    Weakness   Sulfa Antibiotics Nausea Only and Other (See Comments)    States had severe indigestion and heartburn and nausea and had to stop taking   Arava [Leflunomide] Other (See Comments)    Excessive weight gain   Lipitor [Atorvastatin] Nausea And Vomiting    Currently taking 2 times weekly - able to tolerate low dose    Vytorin [Ezetimibe-Simvastatin] Nausea And Vomiting   Family History  Problem Relation Age of Onset   Cancer Mother        pancreatic   Diabetes Mother    Pancreatic cancer Mother    Heart disease Father        Rheumatic   Cancer Father        ? stomach   Stomach cancer Father    Cancer Sister        stomach   Stomach cancer Sister    Cancer Maternal Aunt        X 4; ? primary   Heart disease Paternal Aunt    Cancer Maternal Grandmother        cervical   Colon cancer Other        Aunts   Multiple sclerosis Daughter    Hypertension Neg Hx    Stroke Neg Hx    Esophageal cancer Neg Hx    Rectal cancer Neg Hx     Current Outpatient Medications (Endocrine & Metabolic):    denosumab (PROLIA) 60 MG/ML SOSY injection, Inject 60 mg into the skin every 6 (six) months. Deliver to rheum: [address], [address], [place], [place] [address]. Appt on 07/23/2022   predniSONE (DELTASONE) 5 MG tablet, TAKE 1 TABLET BY MOUTH EVERY DAY IN THE MORNING  Current Outpatient Medications (Cardiovascular):    atorvastatin (LIPITOR) 10 MG tablet, TAKE 1 TAB ON MONDAYS AND FRIDAY   carvedilol (COREG) 3.125 MG tablet, TAKE 1 TABLET BY MOUTH TWICE A DAY WITH A MEAL   furosemide (LASIX) 20 MG tablet, TAKE 1 TABLET BY MOUTH EVERY DAY   Current Outpatient Medications (Analgesics):    oxyCODONE (ROXICODONE) 5 MG immediate release tablet, Take 0.5-1 tablets (2.5-5 mg total) by mouth every 4 (four) hours as  needed for severe pain.   traMADol (ULTRAM) 50 MG tablet, Take 1 tablet (50 mg total) by mouth 3 (three) times daily as needed for severe pain.   Upadacitinib ER ([name]) 15 MG TB24, Take 15 mg by mouth daily.  Current Outpatient Medications (Hematological):    folic acid (FOLVITE) 1 MG tablet, Take 1 tablet (1 mg total) by mouth daily.  Current Outpatient Medications (Other):    Biotin 5000 MCG TABS, Take 10,000 mcg by mouth every evening.   Carboxymethylcellul-Glycerin (LUBRICATING EYE DROPS OP), Place 1 drop into both eyes daily as needed (dry eyes).   cholecalciferol (VITAMIN D) 25 MCG (1000 UNIT) tablet, Take 1,000 Units by mouth in the morning.   cyclobenzaprine (FLEXERIL) 10 MG tablet, TAKE 1 TABLET BY MOUTH THREE TIMES A DAY AS NEEDED FOR MUSCLE SPASMS  diclofenac sodium (VOLTAREN) 1 % GEL, APPLY 3 GRAMS TO 3 LARGE JOINTS 3 TIMES A DAY AS NEEDED   DULoxetine (CYMBALTA) 30 MG capsule, Take 1 capsule (30 mg total) by mouth daily.   famotidine (PEPCID) 10 MG tablet, Take 10 mg by mouth in the morning.   linaclotide (LINZESS) 290 MCG CAPS capsule, Take 1 capsule (290 mcg total) by mouth daily before breakfast.   methotrexate (RHEUMATREX) 2.5 MG tablet, TAKE 6 TABLETS (15 MG TOTAL) BY MOUTH ONCE A WEEK. CAUTION:CHEMOTHERAPY. PROTECT FROM LIGHT.   polyethylene glycol (MIRALAX / GLYCOLAX) 17 g packet, Take 17 g by mouth every other day. In the morning.   TUBERCULIN SYR 1CC/27GX1/2" 27G X 1/2" 1 ML MISC, Use 1 syringe once weekly to inject methotrexate.    Objective  Blood pressure 120/78, pulse 91, height 5' (1.524 m), weight 117 lb (53.1 kg), SpO2 91%.   General: No apparent distress alert and oriented x3 mood and affect normal, dressed appropriately.  HEENT: Pupils equal, extraocular movements intact  Respiratory: Patient's speak in full sentences and does not appear short of breath  Cardiovascular: No lower extremity edema, non tender, no erythema  Antalgic gait noted.  Left hip  does seem to have any audible popping noted.  Tender to palpation in the groin area.  No masses appreciated.     Impression and Recommendations:    The above documentation has been reviewed and is accurate and complete Judi Saa, DO

## 2022-12-24 ENCOUNTER — Other Ambulatory Visit: Payer: Self-pay

## 2022-12-24 ENCOUNTER — Ambulatory Visit: Payer: Medicare Other | Admitting: Family Medicine

## 2022-12-24 ENCOUNTER — Encounter: Payer: Self-pay | Admitting: Family Medicine

## 2022-12-24 ENCOUNTER — Ambulatory Visit (INDEPENDENT_AMBULATORY_CARE_PROVIDER_SITE_OTHER): Payer: Medicare Other

## 2022-12-24 VITALS — BP 120/78 | HR 91 | Ht 60.0 in | Wt 117.0 lb

## 2022-12-24 DIAGNOSIS — M25552 Pain in left hip: Secondary | ICD-10-CM

## 2022-12-24 DIAGNOSIS — T84021A Dislocation of internal left hip prosthesis, initial encounter: Secondary | ICD-10-CM | POA: Insufficient documentation

## 2022-12-24 DIAGNOSIS — M5412 Radiculopathy, cervical region: Secondary | ICD-10-CM

## 2022-12-24 DIAGNOSIS — Z471 Aftercare following joint replacement surgery: Secondary | ICD-10-CM | POA: Diagnosis not present

## 2022-12-24 DIAGNOSIS — M25532 Pain in left wrist: Secondary | ICD-10-CM

## 2022-12-24 DIAGNOSIS — Z96642 Presence of left artificial hip joint: Secondary | ICD-10-CM | POA: Diagnosis not present

## 2022-12-24 MED ORDER — DULOXETINE HCL 30 MG PO CPEP: 30.0000 mg | ORAL_CAPSULE | Freq: Every day | ORAL | 0 refills | Status: DC

## 2022-12-24 NOTE — Patient Instructions (Addendum)
Xray today Cymbalta 30mg   Bone scan  We will be in touch

## 2022-12-24 NOTE — Assessment & Plan Note (Signed)
Concern with instability.  Will get x-rays to further evaluate I do feel a bone scan is needed with the audible popping.

## 2022-12-24 NOTE — Assessment & Plan Note (Signed)
Has responded well to the Cymbalta and will increase to 30 mg.  Patient has not had as much good days recently but has not been feeling like herself or as active.  Did have the accident where patient did fall and tried to catch herself.  Follow-up with me again in 6 to 8 weeks otherwise.

## 2022-12-25 ENCOUNTER — Other Ambulatory Visit: Payer: Self-pay

## 2022-12-30 ENCOUNTER — Telehealth: Payer: Self-pay | Admitting: *Deleted

## 2022-12-30 NOTE — Telephone Encounter (Signed)
Patient called requesting rinvoq samples.    Last sample: 12/04/2022   Labs: 11/03/2022 Vitamin D WNL CBC and CMP stable.   TB Gold: 11/03/2022 negative     Next Visit: 02/18/2023   Last Visit: 11/03/2022   WU:[id number] arthritis involving multiple sites with positive rheumatoid factor    Current Dose per office note on 11/03/2022: [name] 15 mg 1 tablet by mouth daily     Okay to provide rinvoq sample?

## 2022-12-30 NOTE — Telephone Encounter (Signed)
Okay to give [name] samples of available.

## 2022-12-30 NOTE — Telephone Encounter (Signed)
Attempted to contact the patient and left message to advise patient she may stop by the office to pick up a sample of [name].

## 2023-01-02 ENCOUNTER — Telehealth: Payer: Self-pay | Admitting: *Deleted

## 2023-01-02 NOTE — Telephone Encounter (Signed)
Medication Samples have been provided to the patient.  Drug name: [name]       Strength: 15 mg        Qty: 2  LOT: [id number]    Exp.Date: 12/12/2023  Dosing instructions: Take one tablet by mouth daily.

## 2023-01-06 ENCOUNTER — Other Ambulatory Visit: Payer: Self-pay | Admitting: Internal Medicine

## 2023-01-06 NOTE — Telephone Encounter (Signed)
Refill sent to pharmacy.  Due for follow-up-should be seen every 6 months since she is on pain medication.

## 2023-01-15 ENCOUNTER — Other Ambulatory Visit (HOSPITAL_COMMUNITY): Payer: Self-pay

## 2023-01-19 ENCOUNTER — Encounter (HOSPITAL_COMMUNITY)
Admission: RE | Admit: 2023-01-19 | Discharge: 2023-01-19 | Disposition: A | Discharge status: Home / Self Care | Payer: Medicare Other | Source: Ambulatory Visit | Attending: Family Medicine | Admitting: Family Medicine

## 2023-01-19 ENCOUNTER — Other Ambulatory Visit: Payer: Self-pay | Admitting: Family Medicine

## 2023-01-19 DIAGNOSIS — Z96642 Presence of left artificial hip joint: Secondary | ICD-10-CM | POA: Diagnosis not present

## 2023-01-19 DIAGNOSIS — Z981 Arthrodesis status: Secondary | ICD-10-CM | POA: Diagnosis not present

## 2023-01-19 DIAGNOSIS — M898X9 Other specified disorders of bone, unspecified site: Secondary | ICD-10-CM | POA: Diagnosis not present

## 2023-01-19 DIAGNOSIS — M25552 Pain in left hip: Secondary | ICD-10-CM | POA: Insufficient documentation

## 2023-01-19 MED ORDER — TECHNETIUM TC 99M MEDRONATE IV KIT
20.0000 | PACK | Freq: Once | INTRAVENOUS | Status: AC | PRN
  Administered 2023-01-19: 20 via INTRAVENOUS

## 2023-01-20 ENCOUNTER — Other Ambulatory Visit: Payer: Self-pay | Admitting: Physician Assistant

## 2023-01-20 NOTE — Telephone Encounter (Signed)
Last Fill: 07/28/2022 (90 day with 1 refill)   Next Visit: 02/18/2023  Last Visit: 11/03/2022  DX: Rheumatoid arthritis involving multiple sites with positive rheumatoid factor   Current Dose per office note on 11/03/2022: prednisone 5 mg 1 tablet by mouth daily.   Okay to refill prednisone?

## 2023-01-22 ENCOUNTER — Other Ambulatory Visit (HOSPITAL_COMMUNITY): Payer: Self-pay

## 2023-01-22 ENCOUNTER — Telehealth: Payer: Self-pay | Admitting: Pharmacist

## 2023-01-22 ENCOUNTER — Other Ambulatory Visit: Payer: Self-pay | Admitting: *Deleted

## 2023-01-22 DIAGNOSIS — S62101D Fracture of unspecified carpal bone, right wrist, subsequent encounter for fracture with routine healing: Secondary | ICD-10-CM | POA: Diagnosis not present

## 2023-01-22 DIAGNOSIS — Z79899 Other long term (current) drug therapy: Secondary | ICD-10-CM | POA: Diagnosis not present

## 2023-01-22 DIAGNOSIS — M810 Age-related osteoporosis without current pathological fracture: Secondary | ICD-10-CM | POA: Diagnosis not present

## 2023-01-22 DIAGNOSIS — M0579 Rheumatoid arthritis with rheumatoid factor of multiple sites without organ or systems involvement: Secondary | ICD-10-CM

## 2023-01-22 NOTE — Telephone Encounter (Signed)
Pre-certification for Prolia [phone]) initiated via [facility] provider portal. Authorization is approved from 01/22/23 through 01/22/24  Authorization #: [id number]  Chesley Mires, PharmD, MPH, BCPS, CPP Clinical Pharmacist (Rheumatology and Pulmonology)

## 2023-01-22 NOTE — Telephone Encounter (Signed)
Patient due for Prolia labs on 01/20/23.  Will complete labs for Prolia and [name] monitoring: CBC, CMP, lipid panel  TB gold negative on 11/03/2022  Per test claim, copay through pharmacy benefit is $578.96. It is likely going to be more cost effective for her to receive at [facility]. Will need to run benefits in [facility] provider portal  Sherri [name], PharmD, MPH, BCPS, CPP Clinical Pharmacist (Rheumatology and Pulmonology)

## 2023-01-23 ENCOUNTER — Other Ambulatory Visit: Payer: Self-pay | Admitting: *Deleted

## 2023-01-23 ENCOUNTER — Encounter: Payer: Self-pay | Admitting: Pharmacist

## 2023-01-23 ENCOUNTER — Other Ambulatory Visit: Payer: Self-pay | Admitting: Pharmacist

## 2023-01-23 DIAGNOSIS — Z79899 Other long term (current) drug therapy: Secondary | ICD-10-CM

## 2023-01-23 DIAGNOSIS — M0579 Rheumatoid arthritis with rheumatoid factor of multiple sites without organ or systems involvement: Secondary | ICD-10-CM

## 2023-01-23 DIAGNOSIS — M810 Age-related osteoporosis without current pathological fracture: Secondary | ICD-10-CM

## 2023-01-23 LAB — CBC WITH DIFFERENTIAL/PLATELET
Absolute Monocytes: 471 {cells}/uL (ref 200–950)
Basophils Absolute: 9 {cells}/uL (ref 0–200)
Basophils Relative: 0.2 %
Eosinophils Absolute: 9 {cells}/uL — ABNORMAL LOW (ref 15–500)
Eosinophils Relative: 0.2 %
HCT: 35.9 % (ref 35.0–45.0)
Hemoglobin: 12.3 g/dL (ref 11.7–15.5)
Lymphs Abs: 946 {cells}/uL (ref 850–3900)
MCH: 32.9 pg (ref 27.0–33.0)
MCHC: 34.3 g/dL (ref 32.0–36.0)
MCV: 96 fL (ref 80.0–100.0)
MPV: 9.3 fL (ref 7.5–12.5)
Monocytes Relative: 10.7 %
Neutro Abs: 2966 {cells}/uL (ref 1500–7800)
Neutrophils Relative %: 67.4 %
Platelets: 307 10*3/uL (ref 140–400)
RBC: 3.74 10*6/uL — ABNORMAL LOW (ref 3.80–5.10)
RDW: 14.6 % (ref 11.0–15.0)
Total Lymphocyte: 21.5 %
WBC: 4.4 10*3/uL (ref 3.8–10.8)

## 2023-01-23 LAB — LIPID PANEL
Cholesterol: 204 mg/dL — ABNORMAL HIGH (ref <200)
HDL: 101 mg/dL (ref >=50)
LDL Cholesterol (Calc): 88 mg/dL
Non-HDL Cholesterol (Calc): 103 mg/dL (ref <130)
Total CHOL/HDL Ratio: 2 (calc) (ref <5.0)
Triglycerides: 66 mg/dL (ref <150)

## 2023-01-23 LAB — COMPREHENSIVE METABOLIC PANEL
AG Ratio: 1.6 (calc) (ref 1.0–2.5)
ALT: 17 U/L (ref 6–29)
AST: 30 U/L (ref 10–35)
Albumin: 4.2 g/dL (ref 3.6–5.1)
Alkaline phosphatase (APISO): 34 U/L — ABNORMAL LOW (ref 37–153)
BUN/Creatinine Ratio: 18 (calc) (ref 6–22)
BUN: 20 mg/dL (ref 7–25)
CO2: 31 mmol/L (ref 20–32)
Calcium: 9.7 mg/dL (ref 8.6–10.4)
Chloride: 96 mmol/L — ABNORMAL LOW (ref 98–110)
Creat: 1.13 mg/dL — ABNORMAL HIGH (ref 0.60–0.95)
Globulin: 2.6 g/dL (ref 1.9–3.7)
Glucose, Bld: 89 mg/dL (ref 65–99)
Potassium: 5.1 mmol/L (ref 3.5–5.3)
Sodium: 134 mmol/L — ABNORMAL LOW (ref 135–146)
Total Bilirubin: 0.6 mg/dL (ref 0.2–1.2)
Total Protein: 6.8 g/dL (ref 6.1–8.1)

## 2023-01-23 MED ORDER — METHOTREXATE SODIUM 2.5 MG PO TABS: 12.5000 mg | ORAL_TABLET | ORAL | Status: DC

## 2023-01-23 NOTE — Progress Notes (Signed)
Total cholesterol is borderline elevated-204. Rest of lipid panel WNL. RBC count is borderline low. Absolute eosinophils are slightly low. Rest of CBC Wnl.  Creatinine is elevated-1.13--please clarify if she has been taking any NSAIDs? Recommend avoiding NSAID use.  Reduce methotrexate to 5 tablets weekly and recheck BMP with GFR in 2-3 weeks Alk phos remains low but has improved.

## 2023-01-23 NOTE — Telephone Encounter (Signed)
CMP stable to proceed with Prolia. Order placed at [facility]  Chesley Mires, PharmD, MPH, BCPS, CPP Clinical Pharmacist (Rheumatology and Pulmonology)

## 2023-01-23 NOTE — Progress Notes (Signed)
Next Prolia SQ due on 01/20/23. Diagnosis: age-related osteoporosis  Dose: 60 mg SQ every 6 months  Last Clinic Visit: 11/03/2022 Next Clinic Visit: 02/18/23  Last Prolia dose: 07/24/22  Labs: 01/22/23-  CBC, CMP. Vitamin D on 11/03/2022 wnl Last DEXA: 04/21/2022 Next DEXA due: November 2025  Orders placed for Prolia x 1 dose. No premedicatons required.   ATC patient and provided with phone number for: [facility] [phone]) Ginette Otto - MyChart message sent  Will follow-up to ensured scheduled and completed  Chesley Mires, PharmD, MPH, BCPS, CPP Clinical Pharmacist (Rheumatology and Pulmonology)

## 2023-01-23 NOTE — Progress Notes (Signed)
Prolia scheduled for 02/05/23

## 2023-02-03 ENCOUNTER — Telehealth: Payer: Self-pay | Admitting: *Deleted

## 2023-02-03 ENCOUNTER — Other Ambulatory Visit (HOSPITAL_COMMUNITY): Payer: Self-pay

## 2023-02-03 NOTE — Telephone Encounter (Signed)
Patient stopped by the office for a sample of [name].   Medication Samples have been provided to the patient.  Drug name: [name]       Strength: 15 mg        Qty: 2  LOT: [id number] Exp.Date: 02/15/2024  Dosing instructions: Take 15 mg by mouth daily.

## 2023-02-03 NOTE — Telephone Encounter (Signed)
Patient contacted the office and left message stating she has to decided to go ahead with the Prolia this time. Patient states she will proceed with the Forteo next year.

## 2023-02-05 ENCOUNTER — Ambulatory Visit (HOSPITAL_COMMUNITY)
Admission: RE | Admit: 2023-02-05 | Discharge: 2023-02-05 | Disposition: A | Discharge status: Home / Self Care | Payer: Medicare Other | Source: Ambulatory Visit | Attending: Rheumatology | Admitting: Rheumatology

## 2023-02-05 DIAGNOSIS — M810 Age-related osteoporosis without current pathological fracture: Secondary | ICD-10-CM | POA: Diagnosis not present

## 2023-02-05 DIAGNOSIS — Z79899 Other long term (current) drug therapy: Secondary | ICD-10-CM | POA: Insufficient documentation

## 2023-02-05 MED ORDER — DENOSUMAB 60 MG/ML ~~LOC~~ SOSY
PREFILLED_SYRINGE | SUBCUTANEOUS | Status: AC
  Filled 2023-02-05: qty 1

## 2023-02-05 MED ORDER — DENOSUMAB 60 MG/ML ~~LOC~~ SOSY
60.0000 mg | PREFILLED_SYRINGE | Freq: Once | SUBCUTANEOUS | Status: AC
  Administered 2023-02-05: 60 mg via SUBCUTANEOUS

## 2023-02-05 NOTE — Progress Notes (Signed)
Office Visit Note  Patient: Sherri [name]             Date of Birth: 04/16/1941           MRN: [id number]             PCP: Pincus Sanes, MD Referring: Pincus Sanes, MD Visit Date: 02/18/2023 Occupation: @GUAROCC @  Subjective:  Medication management  History of Present Illness: Sherri [name] is a 82 y.o. female with seropositive rheumatoid arthritis, osteoarthritis, degenerative disc disease and osteoporosis.  Patient had left wrist fracture in April 2024 and underwent surgery by Dr. Yehuda Budd.  She had to come off [name] and methotrexate.  She was off [name] and methotrexate almost for 2 months.  She states during that 2 months.  She had several flares of rheumatoid arthritis.  She states during that time her arthritis got much worse.  She had deformity of her right fifth finger and also decreased grip strength in her bilateral hands.  She has difficulty making a fist.  She continues to have pain and discomfort in bilateral right wrists and bilateral hands.  She has a stiffness in the morning for several hours.  She is getting Prolia injections for osteoporosis.  Her last Prolia junction was on February 05, 2023.  She continues to take calcium and vitamin D on a regular basis.    Activities of Daily Living:  Patient reports morning stiffness for several hours.   Patient Reports nocturnal pain.  Difficulty dressing/grooming: Denies Difficulty climbing stairs: Reports Difficulty getting out of chair: Denies Difficulty using hands for taps, buttons, cutlery, and/or writing: Reports  Review of Systems  Constitutional:  Positive for fatigue.  HENT:  Positive for mouth dryness. Negative for mouth sores.   Eyes:  Positive for dryness.  Respiratory:  Negative for shortness of breath.   Cardiovascular:  Negative for chest pain and palpitations.  Gastrointestinal:  Positive for constipation. Negative for blood in stool and diarrhea.  Endocrine: Negative for increased urination.   Genitourinary:  Negative for involuntary urination.  Musculoskeletal:  Positive for joint pain, gait problem, joint pain, joint swelling, myalgias, muscle weakness, morning stiffness, muscle tenderness and myalgias.  Skin:  Positive for sensitivity to sunlight. Negative for color change, rash and hair loss.  Allergic/Immunologic: Positive for susceptible to infections.  Neurological:  Positive for headaches. Negative for dizziness.  Hematological:  Negative for swollen glands.  Psychiatric/Behavioral:  Positive for sleep disturbance. Negative for depressed mood. The patient is not nervous/anxious.     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   Cervical spondylosis with myelopathy and radiculopathy 04/23/2022   Constipation 03/04/2022   Upper back pain on left side 03/04/2022   Lumbar vertebral fracture, pathologic 09/20/2021   Degenerative spondylolisthesis 08/19/2021   Acute left lumbar radiculopathy 07/05/2021   Closed fracture of rib of right side with delayed healing 05/29/2021   Senile purpura (HCC) 12/19/2020   Restless leg 02/02/2020   Bilateral leg edema 01/11/2020   Cervical radiculopathy at C8 06/28/2019   Carpal tunnel syndrome on both sides 06/28/2019   Right sided sciatica 04/14/2018   Trochanteric bursitis of right hip 04/14/2018   H/O cervical spine surgery 11/10/2017   Dysphagia 10/26/2017   Gastroesophageal reflux disease 08/06/2017   Easy bruising 08/06/2017   Mid back pain 03/26/2017   Submandibular gland swelling 01/08/2017   High risk medication use 09/02/2016  Piriformis muscle pain 09/02/2016   Right hip pain 04/08/2016   Right knee pain 02/25/2016   Numbness and tingling in left hand 11/14/2014   Biceps tendon tear 10/10/2014   Insomnia 10/10/2014   Gait disturbance 06/13/2014   OP (osteoporosis) 01/18/2014   Other long term (current) drug therapy 01/18/2014    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 01/01/2013   Personal history of colonic polyps 07/03/2010   Nocturia 05/28/2010   THYROID NODULE 05/17/2008   Multiple sclerosis (HCC) 05/17/2008   Other fatigue 03/30/2007   Hyperlipidemia 08/20/2006   Essential hypertension 08/20/2006   Rheumatoid arthritis (HCC) 08/20/2006   CERVICAL CANCER, HX OF 08/20/2006   GASTRIC ULCER, HX OF 08/20/2006    Past Medical History:  Diagnosis Date   Anxiety    takes Valium daily as needed   Basal cell carcinoma 01/21/1988   left nostril (MOHS), sup-right calf (CX35FU)   Basal cell carcinoma 03/22/1996   right calf (CX35FU)   Basal cell carcinoma 03/31/1995   left wing nose   Bruises easily    d/t meds   Cancer (HCC)    basal cell ca, in situ- uterine    Cataracts, bilateral    removed bilateral   Chronic back pain    Dizziness    r/t to meds   GERD (gastroesophageal reflux disease)    no meds on a regular basis but will take Tums if needed   Headache(784.0)    r/t neck issues   History of bronchitis 6-29yrs ago   History of colon polyps    benign   HOH (hard of hearing)    wears hearing aids   Hyperlipidemia    takes Atorvastatin on Mondays and Fridays   Hypertension    Joint pain    Joint swelling    Multiple sclerosis (HCC)    Neuromuscular disorder (HCC)    Dr. Atmore Nation- [facility], follows [facility]   Nocturia    PONV (postoperative nausea and vomiting)    trouble urinating after surgery in 2014   Postoperative nausea and vomiting 01/11/2019   Rheumatoid arthritis (HCC)    Dr Glo Herring weekly, RA- hands- knees- feet    Rheumatoid arthritis(714.0)    Dr Corliss Skains takes Harriette Ohara daily   Right wrist fracture    Skin cancer    Squamous cell carcinoma of skin 11/02/2001   in situ-right knee (cx45fu)   Squamous cell carcinoma of skin 11/12/2011   in situ-left forearm (CX35FU), in situ-left foot (CX35FU)   Squamous cell carcinoma of skin 01/22/2012   in situ-left  lower forearm (txpbx)   Squamous cell carcinoma of skin 11/17/2012   right shin (txpbx)   Squamous cell carcinoma of skin 10/28/2013   in situ-right shoulder (CX35FU)   Squamous cell carcinoma of skin 04/28/2014   well diff-right shin (txpbx)   Squamous cell carcinoma of skin 11/02/2014   in situ-Left shin (txpbx)   Squamous cell carcinoma of skin 12/15/2014   in situ-Left hand (txpbx), bowens-left side chest (txpbx)   Squamous cell carcinoma of skin 05/09/2015   in situ-left hand (txpbx)    Squamous cell carcinoma of skin 05/07/2016   in situ-left inner shin,ant, in situ-left inner shin, post, in situ-left outer forearm, in situ-right knuckle   Squamous cell carcinoma of skin 03/1302018   in situ-left shoulder (txpbx), in situ-right inner shin (txpbx), in situ-top of left foot (txpbx), KA- left forearm (txpbx)   Squamous cell carcinoma of skin 12/02/2016   in situ-left inner  shin (txpbx)   Squamous cell carcinoma of skin 03/04/2017   in situ-left outer sup, shin (txpbx), in situ- right 2nd knuckle finger (txpbx), in situ- Left wrist (txpbx)   Squamous cell carcinoma of skin 04/06/2018   in situ-above left knee inner (txpbx)   Squamous cell carcinoma of skin 04/21/2018   in situ-right top hand (txpbx)   Squamous cell carcinoma of skin 07/08/2018   in situ-right lower inner shin (txpbx)   Squamous cell carcinoma of skin 04/27/2019   in situ- Left neck(CX35FU), In situ- right neck (CX35FU)   Urinary retention    sees Dr.Wrenn about 2 times a yr    Family History  Problem Relation Age of Onset   Cancer Mother        pancreatic   Diabetes Mother    Pancreatic cancer Mother    Heart disease Father        Rheumatic   Cancer Father        ? stomach   Stomach cancer Father    Cancer Sister        stomach   Stomach cancer Sister    Cancer Maternal Aunt        X 4; ? primary   Heart disease Paternal Aunt    Cancer Maternal Grandmother        cervical   Colon cancer Other         Aunts   Multiple sclerosis Daughter    Hypertension Neg Hx    Stroke Neg Hx    Esophageal cancer Neg Hx    Rectal cancer Neg Hx    Past Surgical History:  Procedure Laterality Date   ABDOMINAL HYSTERECTOMY     1985   ANTERIOR CERVICAL DECOMP/DISCECTOMY FUSION N/A 04/23/2022   Procedure: Anterior Cervical Decompression/Discectomy Fusion - Cervical Seven-Thoracic One;  Surgeon: Julio Sicks, MD;  Location: [facility] OR;  Service: Neurosurgery;  Laterality: N/A;  3C   APPENDECTOMY     with TAH   BACK SURGERY     several   BREAST BIOPSY Right    Results were negative   BROW LIFT Bilateral 10/02/2016   Procedure: BILATERAL LOWER LID BLEPHAROPLASTY;  Surgeon: Floydene Flock, MD;  Location: [facility] OR;  Service: Plastics;  Laterality: Bilateral;   CARPAL TUNNEL RELEASE Right    cataracts     CERVICAL FUSION     Dr Jeral Fruit   CERVICAL FUSION  12/31/2012   Dr Jeral Fruit   CERVICAL FUSION  04/2022   CHEST TUBE INSERTION     for traumatic Pneumothorax   COLONOSCOPY  07/16/2010   normal    COLONOSCOPY W/ POLYPECTOMY  1997   negative since; Dr Arlyce Dice   ESOPHAGOGASTRODUODENOSCOPY  07/16/2010   normal   eye lid raise     EYE SURGERY Bilateral    cataracts removed - /w IOL   HARDWARE REMOVAL  08/2021   JOINT REPLACEMENT     LUMBAR FUSION     Dr Jeral Fruit   NASAL SINUS SURGERY     POSTERIOR CERVICAL FUSION/FORAMINOTOMY N/A 12/31/2012   Procedure: POSTERIOR LATERAL CERVICAL FUSION/FORAMINOTOMY LEVEL 1 CERVICAL THREE-FOUR WITH LATERAL MASS SCREWS;  Surgeon: Karn Cassis, MD;  Location: [facility];  Service: Neurosurgery;  Laterality: N/A;   PTOSIS REPAIR Bilateral 10/02/2016   Procedure: INTERNAL PTOSIS REPAIR;  Surgeon: Floydene Flock, MD;  Location: [facility] OR;  Service: Plastics;  Laterality: Bilateral;   SKIN BIOPSY     THYROID SURGERY     R lobe  removed- 1972, has grown back - CT last done- 2018   TOTAL HIP ARTHROPLASTY  12/22/2011   Procedure: TOTAL HIP ARTHROPLASTY;  Surgeon: Nestor Lewandowsky, MD;  Location: [facility] OR;  Service: Orthopedics;  Laterality: Left;   TOTAL SHOULDER ARTHROPLASTY     TUBAL LIGATION     UPPER GASTROINTESTINAL ENDOSCOPY  2012   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   WRIST SURGERY Left 09/16/2022   wrist/arm   Social History   Social History Narrative   Not on file   Immunization History  Administered Date(s) Administered   Fluad Quad(high Dose 65+) 02/02/2020, 02/01/2022   Influenza Whole 02/22/2007, 02/08/2013   Influenza, High Dose Seasonal PF 02/04/2015, 02/18/2017, 02/21/2018, 01/16/2019, 01/25/2021, 02/02/2023   Influenza-Unspecified 01/24/2014, 01/25/2016, 02/18/2017, 01/16/2019, 01/25/2020   Moderna Covid-19 Fall Seasonal Vaccine 60yrs & older 02/02/2023   Moderna Covid-19 Vaccine Bivalent Booster 59yrs & up 02/11/2022   PFIZER(Purple Top)SARS-COV-2 Vaccination 06/18/2019, 07/09/2019, 01/09/2020, 07/03/2020, 02/07/2021   PNEUMOCOCCAL CONJUGATE-20 02/26/2022   Pfizer Covid-19 Vaccine Bivalent Booster 30yrs & up 02/08/2021   Pneumococcal Conjugate-13 11/23/2012, 09/08/2014   Pneumococcal Polysaccharide-23 05/26/2006   Pneumococcal-Unspecified 12/28/2012, 06/09/2020   Respiratory Syncytial Virus Vaccine,Recomb Aduvanted(Arexvy) 01/23/2022   Td 05/28/2010   Tdap 12/24/2020, 01/07/2021   Unspecified SARS-COV-2 Vaccination 06/26/2020, 02/11/2022   Varicella 11/07/2020   Zoster Recombinant(Shingrix) 02/10/2019, 04/14/2019   Zoster, Live 05/26/2009, 07/10/2020     Objective: Vital Signs: BP (!) 145/88 (BP Location: Left Arm, Patient Position: Sitting, Cuff Size: Normal)   Pulse 74   Resp 14   Ht 5\' 2"  (1.575 m)   Wt 117 lb 9.6 oz (53.3 kg)   BMI 21.51 kg/m    Physical Exam Vitals and nursing note reviewed.  Constitutional:      Appearance: She is well-developed.  HENT:     Head: Normocephalic and atraumatic.  Eyes:     Conjunctiva/sclera: Conjunctivae normal.  Cardiovascular:     Rate and Rhythm: Normal rate and regular  rhythm.     Heart sounds: Normal heart sounds.  Pulmonary:     Effort: Pulmonary effort is normal.     Breath sounds: Normal breath sounds.  Abdominal:     General: Bowel sounds are normal.     Palpations: Abdomen is soft.  Musculoskeletal:     Cervical back: Normal range of motion.  Lymphadenopathy:     Cervical: No cervical adenopathy.  Skin:    General: Skin is warm and dry.     Capillary Refill: Capillary refill takes less than 2 seconds.  Neurological:     Mental Status: She is alert and oriented to person, place, and time.  Psychiatric:        Behavior: Behavior normal.      Musculoskeletal Exam: She had limited lateral rotation of the cervical spine more so on the left than the right side.  She had limited range of motion of her lumbar spine.  Shoulder joints, elbow joints were in good range of motion.  She had limited range of motion of her right wrist joint.  Synovial thickening was noted over right wrist joint.  She has thickening of bilateral MCP joints with some synovitis as described below.  She had bilateral PIP thickening with synovitis.  She has subluxation of right fifth PIP joint.  She had bilateral DIP thickening and limited extension.  Hip joints and knee joints were in good range of motion.  There was no tenderness over ankles or MTPs.  CDAI Exam: CDAI  Score: 12  Patient Global: 20 / 100; Provider Global: 20 / 100 Swollen: 4 ; Tender: 4  Joint Exam 02/18/2023      Right  Left  MCP 2     Swollen Tender  PIP 3 (finger)  Swollen Tender     PIP 4 (finger)  Swollen Tender  Swollen Tender     Investigation: No additional findings.  Imaging: No results found.  Recent Labs: Lab Results  Component Value Date   WBC 4.4 01/22/2023   HGB 12.3 01/22/2023   PLT 307 01/22/2023   NA 134 (L) 01/22/2023   K 5.1 01/22/2023   CL 96 (L) 01/22/2023   CO2 31 01/22/2023   GLUCOSE 89 01/22/2023   BUN 20 01/22/2023   CREATININE 1.13 (H) 01/22/2023   BILITOT 0.6  01/22/2023   ALKPHOS 29 (L) 04/10/2020   AST 30 01/22/2023   ALT 17 01/22/2023   PROT 6.8 01/22/2023   ALBUMIN 4.2 04/10/2020   CALCIUM 9.7 01/22/2023   GFRAA 65 09/26/2020   QFTBGOLDPLUS NEGATIVE 11/03/2022    Speciality Comments: Prior therapy includes: Harriette Ohara (cost), Olumiant-inadequate response, Imuran weakness, leflunomide-intolerance Prolia: 05/04/18, 11/12/18, 06/09/19, 12/07/19, 07/05/20, 01/01/21, 07/09/21, 01/09/22, 07/24/22  Procedures:  No procedures performed Allergies: Darvon [propoxyphene hcl], Demerol [meperidine], Penicillins, Imuran [azathioprine], Sulfa antibiotics, Arava [leflunomide], Lipitor [atorvastatin], and Vytorin [ezetimibe-simvastatin]   Assessment / Plan:     Visit Diagnoses: Rheumatoid arthritis involving multiple sites with positive rheumatoid factor (HCC)-patient had a flare of rheumatoid arthritis while she was off [name] and methotrexate in April 2024.  Patient states she had to stay off of both medications for couple of months which caused a severe flare.  She noticed deterioration in her joints during that time.  She continues to have some pain and stiffness in her hands.  She has noticed decreased grip strength and difficulty with fist formation.  She is also concerned about the subluxation of her right fifth PIP joint.  High risk medication use - [name] 15 mg 1 tablet by mouth daily, Methotrexate 5 tablets weekly, folic acid 1 mg by mouth daily, prednisone 5 mg 1 tablet by mouth daily.  Labs obtained on January 22, 2023 CBC and CMP were stable except creatinine was elevated at 1.13.  At the time dose of methotrexate was decreased from 6 tablets p.o. weekly to 5 tablets p.o. weekly.  She has not noticed any difference from decreasing methotrexate dose.  Will recheck labs in November.  Information on immunization was placed in the AVS.  She was advised to hold [name] and methotrexate if she develops an infection resume after the infection resolves.  FDA blackbox  warning on [name] was reviewed regarding adverse cardiovascular events, thrombosis, serious infections, lymphoma, DVTs, MI and stroke.  Patient voiced understanding.  Use of sunscreen was also advised as there is increased risk of nonmelanoma skin cancer with [name].  Long term (current) use of systemic steroids-patient has been on low-dose prednisone for control of her severe rheumatoid arthritis.  She is unable to taper prednisone.  Association of weight gain, osteoporosis, hypertension, heart disease, cataracts and diabetes with prednisone use was also discussed.  Ganglion cyst of volar aspect of right wrist-a ganglion cyst was noted on the volar aspect of her right wrist.  It is not symptomatic.  I offered a referral to hand surgery which she declined.  Pain in both hands-she has been using pain and discomfort in her bilateral hands.  She has mild synovitis and severe rheumatoid arthritis and  osteoarthritis overlap.  Primary osteoarthritis of right hip-she had good range  History of total hip replacement, left - Dr. Turner Daniels.  Doing well.  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 - She underwent an uncomplicated C7-T1 anterior cervical discectomy and fusion on 04/23/2022 performed by Dr. Jordan Likes.  DDD (degenerative disc disease), lumbar - Lumbar spine surgery in January 2023 x2 by Dr. Jordan Likes.  Age-related osteoporosis without current pathological fracture - DEXA 04/14/22: RFN T-score -3.2. Current treatment-prolia 60 mg sq injections every 6 months- last prolia injection: 02/05/2023.  Need for taking calcium and vitamin D on a regular basis was emphasized.  Calcium rich diet was discussed.  Need for regular exercise was discussed.  Closed fracture of right wrist with routine healing, subsequent encounter -she has some limitation with range of motion and synovial thickening.  Closed fracture of left wrist with routine healing, subsequent encounter - Fall 09/16/22--Under care of Dr. Yehuda Budd.  She had  good recovery from the surgery.  She still have some discomfort intermittently.  No synovitis was noted.  Vitamin D deficiency-patient has been taking vitamin D.  Vitamin D was 46 on November 03, 2022.  Other fatigue-she continues to have some fatigue.  Primary insomnia-sleep  History of hypertension-blood pressure was elevated at 145/88 today.  Repeat blood pressure was 154/73.  Patient was advised to monitor blood pressure closely and follow-up with the PCP.  History of hyperlipidemia  History of multiple sclerosis (HCC)  History of depression  History of anxiety  History of cellulitis  Orders: No orders of the defined types were placed in this encounter.  No orders of the defined types were placed in this encounter.    Follow-Up Instructions: Return in about 3 months (around 05/20/2023) for Rheumatoid arthritis, Osteoarthritis, Osteoporosis.   Pollyann Savoy, MD  Note - This record has been created using Animal nutritionist.  Chart creation errors have been sought, but may not always  have been located. Such creation errors do not reflect on  the standard of medical care.

## 2023-02-06 ENCOUNTER — Other Ambulatory Visit (HOSPITAL_COMMUNITY): Payer: Self-pay

## 2023-02-18 ENCOUNTER — Encounter: Payer: Self-pay | Admitting: Rheumatology

## 2023-02-18 ENCOUNTER — Ambulatory Visit: Payer: Medicare Other | Attending: Rheumatology | Admitting: Rheumatology

## 2023-02-18 VITALS — BP 154/73 | HR 73 | Resp 14 | Ht 60.5 in | Wt 117.6 lb

## 2023-02-18 DIAGNOSIS — M67431 Ganglion, right wrist: Secondary | ICD-10-CM | POA: Diagnosis not present

## 2023-02-18 DIAGNOSIS — Z872 Personal history of diseases of the skin and subcutaneous tissue: Secondary | ICD-10-CM

## 2023-02-18 DIAGNOSIS — S62101D Fracture of unspecified carpal bone, right wrist, subsequent encounter for fracture with routine healing: Secondary | ICD-10-CM

## 2023-02-18 DIAGNOSIS — E559 Vitamin D deficiency, unspecified: Secondary | ICD-10-CM

## 2023-02-18 DIAGNOSIS — M4312 Spondylolisthesis, cervical region: Secondary | ICD-10-CM | POA: Diagnosis not present

## 2023-02-18 DIAGNOSIS — Z8639 Personal history of other endocrine, nutritional and metabolic disease: Secondary | ICD-10-CM

## 2023-02-18 DIAGNOSIS — F5101 Primary insomnia: Secondary | ICD-10-CM

## 2023-02-18 DIAGNOSIS — G35 Multiple sclerosis: Secondary | ICD-10-CM

## 2023-02-18 DIAGNOSIS — M810 Age-related osteoporosis without current pathological fracture: Secondary | ICD-10-CM

## 2023-02-18 DIAGNOSIS — M79641 Pain in right hand: Secondary | ICD-10-CM

## 2023-02-18 DIAGNOSIS — Z7952 Long term (current) use of systemic steroids: Secondary | ICD-10-CM | POA: Diagnosis not present

## 2023-02-18 DIAGNOSIS — M1611 Unilateral primary osteoarthritis, right hip: Secondary | ICD-10-CM

## 2023-02-18 DIAGNOSIS — Z79899 Other long term (current) drug therapy: Secondary | ICD-10-CM | POA: Diagnosis not present

## 2023-02-18 DIAGNOSIS — Z96642 Presence of left artificial hip joint: Secondary | ICD-10-CM

## 2023-02-18 DIAGNOSIS — M5136 Other intervertebral disc degeneration, lumbar region: Secondary | ICD-10-CM | POA: Diagnosis not present

## 2023-02-18 DIAGNOSIS — Z8659 Personal history of other mental and behavioral disorders: Secondary | ICD-10-CM

## 2023-02-18 DIAGNOSIS — M79642 Pain in left hand: Secondary | ICD-10-CM

## 2023-02-18 DIAGNOSIS — M0579 Rheumatoid arthritis with rheumatoid factor of multiple sites without organ or systems involvement: Secondary | ICD-10-CM

## 2023-02-18 DIAGNOSIS — R5383 Other fatigue: Secondary | ICD-10-CM

## 2023-02-18 DIAGNOSIS — S62102D Fracture of unspecified carpal bone, left wrist, subsequent encounter for fracture with routine healing: Secondary | ICD-10-CM | POA: Diagnosis not present

## 2023-02-18 DIAGNOSIS — Z8679 Personal history of other diseases of the circulatory system: Secondary | ICD-10-CM

## 2023-02-18 NOTE — Patient Instructions (Addendum)
Standing Labs We placed an order today for your standing lab work.   Please have your standing labs drawn in November and every 3 months  Please have your labs drawn 2 weeks prior to your appointment so that the provider can discuss your lab results at your appointment, if possible.  Please note that you may see your imaging and lab results in MyChart before we have reviewed them. We will contact you once all results are reviewed. Please allow our office up to 72 hours to thoroughly review all of the results before contacting the office for clarification of your results.  WALK-IN LAB HOURS  Monday through Thursday from 8:00 am -12:30 pm and 1:00 pm-5:00 pm and Friday from 8:00 am-12:00 pm.  Patients with office visits requiring labs will be seen before walk-in labs.  You may encounter longer than normal wait times. Please allow additional time. Wait times may be shorter on  Monday and Thursday afternoons.  We do not book appointments for walk-in labs. We appreciate your patience and understanding with our staff.   Labs are drawn by [facility]. Please bring your co-pay at the time of your lab draw.  You may receive a bill from [facility] for your lab work.  Please note if you are on Hydroxychloroquine and and an order has been placed for a Hydroxychloroquine level,  you will need to have it drawn 4 hours or more after your last dose.  If you wish to have your labs drawn at another location, please call the office 24 hours in advance so we can fax the orders.  The office is located at [address], [address], [place], [place] [address]   If you have any questions regarding directions or hours of operation,  please call [phone].   As a reminder, please drink plenty of water prior to coming for your lab work. Thanks!   Vaccines You are taking a medication(s) that can suppress your immune system.  The following immunizations are recommended: Flu annually Covid-19  RSV Td/Tdap (tetanus,  diphtheria, pertussis) every 10 years Pneumonia (Prevnar 15 then Pneumovax 23 at least 1 year apart.  Alternatively, can take Prevnar 20 without needing additional dose) Shingrix: 2 doses from 4 weeks to 6 months apart  Please check with your PCP to make sure you are up to date.   If you have signs or symptoms of an infection or start antibiotics: First, call your PCP for workup of your infection. Hold your medication through the infection, until you complete your antibiotics, and until symptoms resolve if you take the following: Injectable medication (Actemra, Benlysta, Cimzia, Cosentyx, Enbrel, Humira, Kevzara, Orencia, Remicade, Simponi, Stelara, Taltz, Tremfya) Methotrexate Leflunomide (Arava) Mycophenolate (Cellcept) Osborne Oman, or [name]   Because you are taking Harriette Ohara, [name], or Olumiant, it is very important to know that this class of medications has a FDA BLACK BOX WARNING for major adverse cardiovascular events (MACE), thrombosis, mortality (including sudden cardiovascular death), serious infections, and lymphomas. MACE is defined as cardiovascular death, myocardial infarction, and stroke. Thrombosis includes deep venous thrombosis (DVT), pulmonary embolism (PE), and arterial thrombosis. If you are a current or former smoker, you are at higher risk for MACE.

## 2023-02-26 ENCOUNTER — Other Ambulatory Visit: Payer: Self-pay | Admitting: Internal Medicine

## 2023-03-01 ENCOUNTER — Other Ambulatory Visit: Payer: Self-pay | Admitting: Internal Medicine

## 2023-03-01 ENCOUNTER — Other Ambulatory Visit: Payer: Self-pay | Admitting: Physician Assistant

## 2023-03-01 ENCOUNTER — Other Ambulatory Visit: Payer: Self-pay | Admitting: Rheumatology

## 2023-03-02 NOTE — Telephone Encounter (Signed)
Last Fill: 03/04/2022  Next Visit: 05/25/2023  Last Visit: 02/18/2023  Dx: Rheumatoid arthritis involving multiple sites with positive rheumatoid factor   Current Dose per office note on 02/18/2023: folic acid 1 mg by mouth daily   Okay to refill Folic Acid?

## 2023-03-09 ENCOUNTER — Telehealth: Payer: Self-pay | Admitting: *Deleted

## 2023-03-09 NOTE — Telephone Encounter (Signed)
Medication Samples have been provided to the patient.  Drug name: [name] Strength: 15 mg Qty: 2 LOT: [id number] Exp.Date: Feb 15 2024  Dosing instructions: Take one tablet by mouth daily

## 2023-03-10 DIAGNOSIS — D0462 Carcinoma in situ of skin of left upper limb, including shoulder: Secondary | ICD-10-CM | POA: Diagnosis not present

## 2023-03-10 DIAGNOSIS — D0461 Carcinoma in situ of skin of right upper limb, including shoulder: Secondary | ICD-10-CM | POA: Diagnosis not present

## 2023-03-10 DIAGNOSIS — L814 Other melanin hyperpigmentation: Secondary | ICD-10-CM | POA: Diagnosis not present

## 2023-03-10 DIAGNOSIS — T148XXA Other injury of unspecified body region, initial encounter: Secondary | ICD-10-CM | POA: Diagnosis not present

## 2023-03-10 DIAGNOSIS — D485 Neoplasm of uncertain behavior of skin: Secondary | ICD-10-CM | POA: Diagnosis not present

## 2023-03-10 DIAGNOSIS — L821 Other seborrheic keratosis: Secondary | ICD-10-CM | POA: Diagnosis not present

## 2023-03-10 DIAGNOSIS — D1801 Hemangioma of skin and subcutaneous tissue: Secondary | ICD-10-CM | POA: Diagnosis not present

## 2023-03-18 ENCOUNTER — Other Ambulatory Visit: Payer: Self-pay | Admitting: Internal Medicine

## 2023-03-19 IMAGING — DX DG HIP (WITH OR WITHOUT PELVIS) 5+V BILAT
5 series · 5 of 5 positions shown · non-contrast
Comparison: None.

CLINICAL DATA: Bilateral hip pain.

EXAM:
DG HIP (WITH OR WITHOUT PELVIS) 5+V BILAT

[pelvis ap]
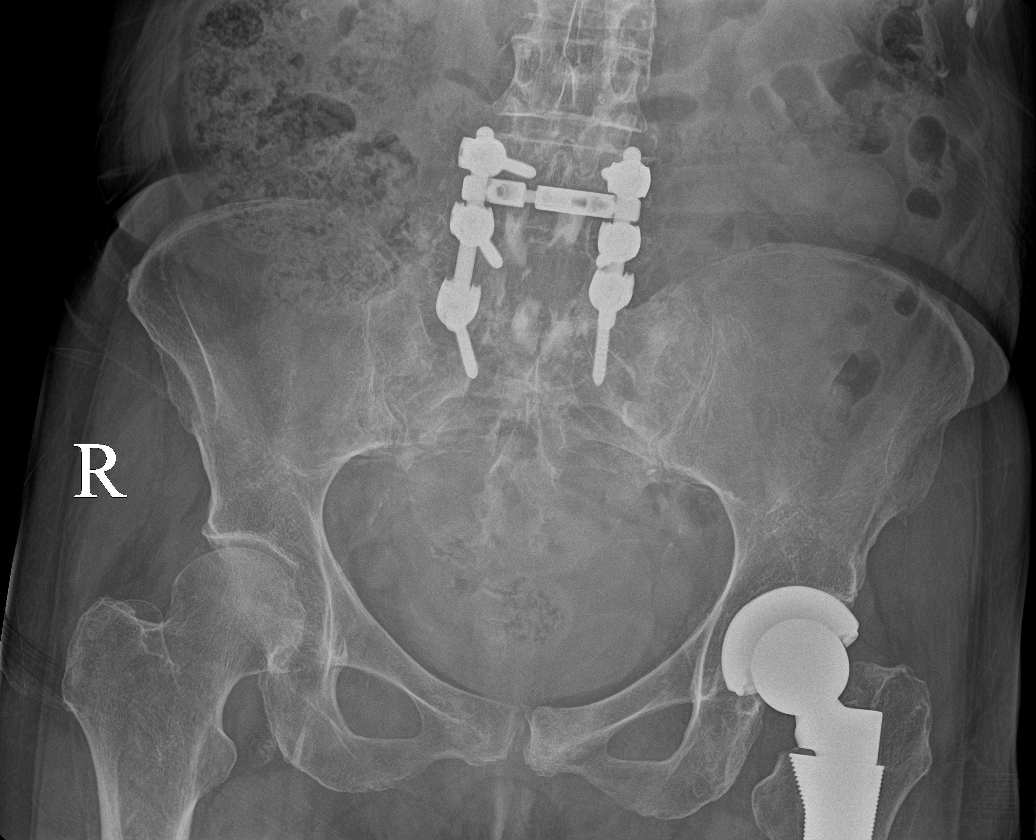

[hip ap (1 of 2)]
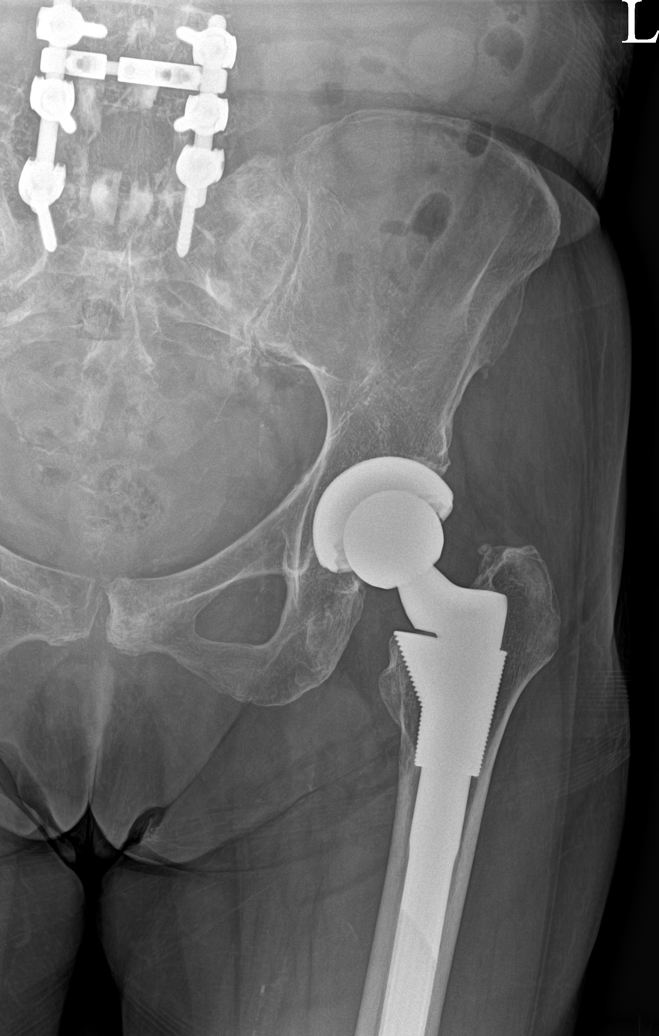

[hip frog leg (1 of 2)]
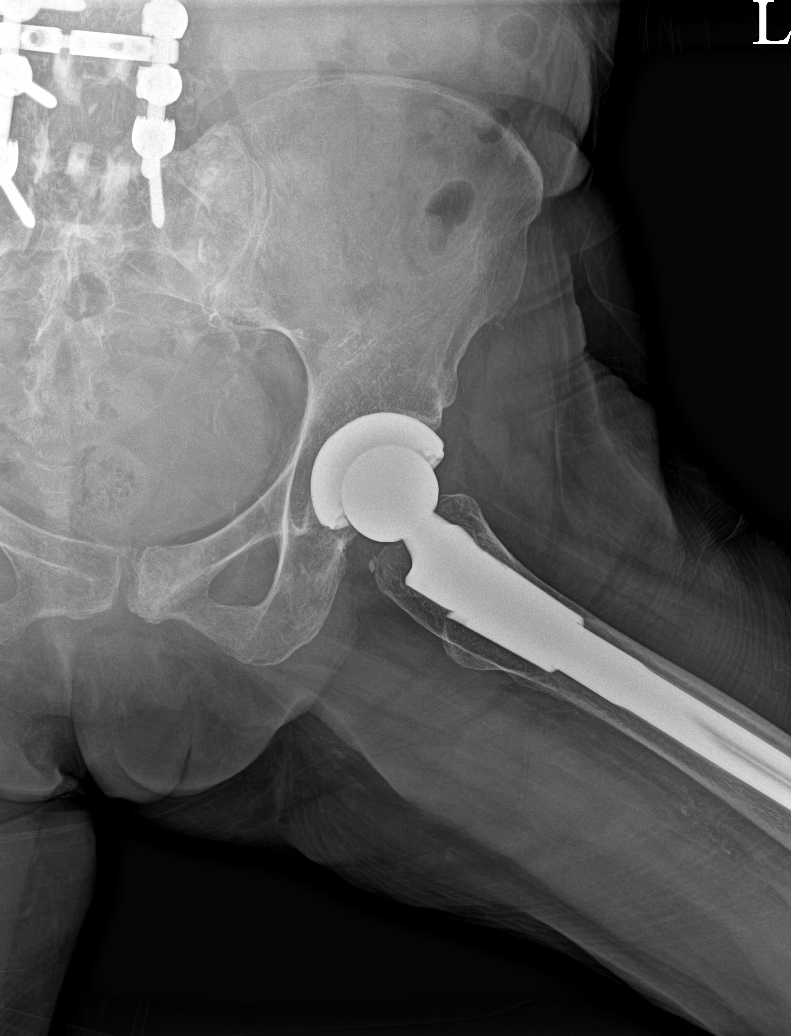

[hip ap (2 of 2)]
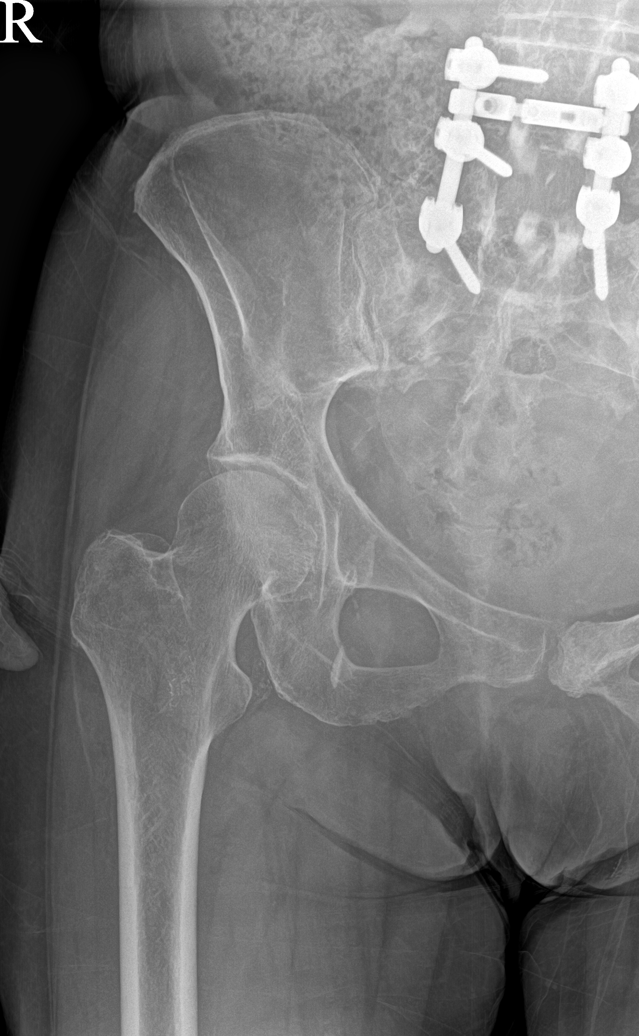

[hip frog leg (2 of 2)]
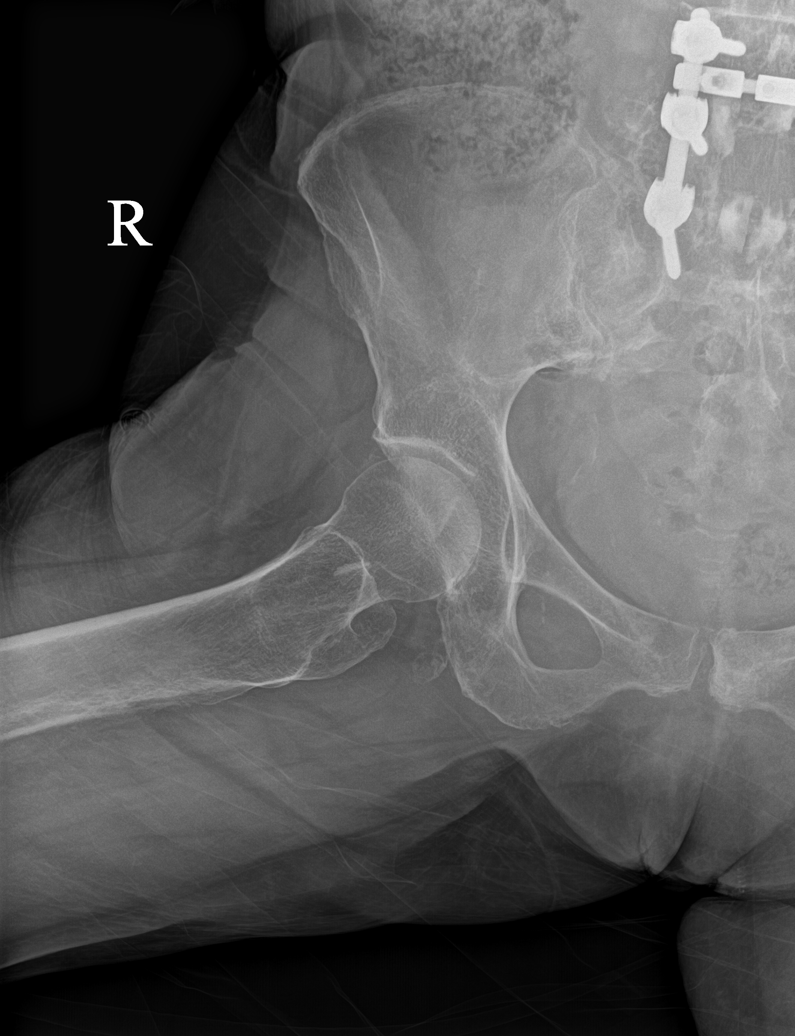

[5 of 5 positions shown; findings below may reference images not displayed]

FINDINGS: There is no evidence of hip fracture or dislocation. A total left
hip replacement is seen without evidence of surrounding lucency to
suggest the presence of hardware loosening or infection. Mild to
moderate severity degenerative changes are seen involving the right
hip. This is in the form of joint space narrowing and acetabular
sclerosis. Additional chronic and degenerative changes are noted
along the symphysis pubis. Bilateral radiopaque pedicle screws are
seen within the lower lumbar spine.
IMPRESSION: 1. Mild to moderate severity degenerative changes of the right hip.
2. Left hip replacement without evidence of hardware complication.

## 2023-03-21 ENCOUNTER — Other Ambulatory Visit: Payer: Self-pay | Admitting: Rheumatology

## 2023-03-23 ENCOUNTER — Other Ambulatory Visit: Payer: Self-pay | Admitting: *Deleted

## 2023-03-23 ENCOUNTER — Telehealth: Payer: Self-pay | Admitting: *Deleted

## 2023-03-23 DIAGNOSIS — M0579 Rheumatoid arthritis with rheumatoid factor of multiple sites without organ or systems involvement: Secondary | ICD-10-CM

## 2023-03-23 DIAGNOSIS — Z79899 Other long term (current) drug therapy: Secondary | ICD-10-CM | POA: Diagnosis not present

## 2023-03-23 NOTE — Telephone Encounter (Signed)
Patient contacted the office stating she needs samples on [name]. Patient advised she will need to have a BMP done when she picks up samples. Samples reserved in cabinet for patient.

## 2023-03-23 NOTE — Telephone Encounter (Signed)
Medication Samples have been provided to the patient.  Drug name: [name]       Strength: 15 mg        Qty: 2  LOT: [id number]  Exp.Date: 02/15/2024  Dosing instructions: Take one tablet by mouth daily.

## 2023-03-23 NOTE — Telephone Encounter (Signed)
Last Fill: 01/20/2023  Next Visit: 05/25/2023  Last Visit: 02/18/2023  Dx: Rheumatoid arthritis involving multiple sites with positive rheumatoid factor   Current Dose per office note on 02/18/2023: prednisone 5 mg 1 tablet by mouth daily.   Okay to refill Prednisone?

## 2023-03-24 LAB — BASIC METABOLIC PANEL WITH GFR
BUN/Creatinine Ratio: 16 (calc) (ref 6–22)
BUN: 16 mg/dL (ref 7–25)
CO2: 31 mmol/L (ref 20–32)
Calcium: 9.5 mg/dL (ref 8.6–10.4)
Chloride: 97 mmol/L — ABNORMAL LOW (ref 98–110)
Creat: 1.03 mg/dL — ABNORMAL HIGH (ref 0.60–0.95)
Glucose, Bld: 95 mg/dL (ref 65–99)
Potassium: 4.9 mmol/L (ref 3.5–5.3)
Sodium: 136 mmol/L (ref 135–146)
eGFR: 55 mL/min/{1.73_m2} — ABNORMAL LOW (ref >=60)

## 2023-03-24 NOTE — Progress Notes (Signed)
Creatinine has improved-1.03 and GFR is low-55. Continue to avoid NSAIDs.  Lab work will be monitored closely.

## 2023-04-01 ENCOUNTER — Telehealth: Payer: Self-pay

## 2023-04-01 NOTE — Telephone Encounter (Signed)
-----   Message from Pincus Sanes sent at 03/18/2023 12:25 PM EDT ----- Regarding: Due for follow-up She is due for routine follow-up.  Ideally should be seen every 6 months since she is on tramadol which is a controlled substance.-Please call her to schedule an appointment

## 2023-04-01 NOTE — Telephone Encounter (Signed)
Message left for patient to return call to clinic to schedule appointment.  If she calls back please schedule.  Gwenda Heiner~

## 2023-04-02 NOTE — Progress Notes (Signed)
Subjective:    Patient ID: Sherri [name], female    DOB: 1941-03-06, 82 y.o.   MRN: [id number]     HPI [place] is here for follow up of her chronic medical problems.  She is stressed - her husband's health is declining.   Her arthritis pain is significant.  She takes the tramadol at night - occ during the day.  Constipation limits her use.   A lot of her pain is OA related.    Medications and allergies reviewed with patient and updated if appropriate.  Current Outpatient Medications on File Prior to Visit  Medication Sig Dispense Refill   atorvastatin (LIPITOR) 10 MG tablet TAKE 1 TAB ON MONDAYS AND FRIDAY 26 tablet 4   Biotin 5000 MCG TABS Take 10,000 mcg by mouth every evening.     Carboxymethylcellul-Glycerin (LUBRICATING EYE DROPS OP) Place 1 drop into both eyes daily as needed (dry eyes).     carvedilol (COREG) 3.125 MG tablet TAKE 1 TABLET BY MOUTH TWICE A DAY WITH FOOD 180 tablet 1   cholecalciferol (VITAMIN D) 25 MCG (1000 UNIT) tablet Take 1,000 Units by mouth in the morning.     cyclobenzaprine (FLEXERIL) 10 MG tablet TAKE 1 TABLET BY MOUTH THREE TIMES A DAY AS NEEDED FOR MUSCLE SPASM 90 tablet 0   denosumab (PROLIA) 60 MG/ML SOSY injection Inject 60 mg into the skin every 6 (six) months. Deliver to rheum: [address], [place], [place] [address]. Appt on 07/23/2022 1 mL 0   diazepam (VALIUM) 5 MG tablet TAKE 1 TABLET BY MOUTH AT BEDTIME AS NEEDED FOR MUSCLE SPASMS. 30 tablet 0   diclofenac sodium (VOLTAREN) 1 % GEL APPLY 3 GRAMS TO 3 LARGE JOINTS 3 TIMES A DAY AS NEEDED 100 g 0   DULoxetine (CYMBALTA) 30 MG capsule TAKE 1 CAPSULE BY MOUTH EVERY DAY 90 capsule 1   famotidine (PEPCID) 10 MG tablet Take 10 mg by mouth in the morning.     folic acid (FOLVITE) 1 MG tablet TAKE 1 TABLET BY MOUTH EVERY DAY 90 tablet 3   furosemide (LASIX) 20 MG tablet TAKE 1 TABLET BY MOUTH EVERY DAY 90 tablet 1   methotrexate (RHEUMATREX) 2.5 MG tablet Take 5 tablets (12.5  mg total) by mouth once a week. Caution:Chemotherapy. Protect from light.     predniSONE (DELTASONE) 5 MG tablet TAKE 1 TABLET BY MOUTH EVERY DAY IN THE MORNING 90 tablet 0   traMADol (ULTRAM) 50 MG tablet TAKE 1 TABLET (50 MG TOTAL) BY MOUTH 3 (THREE) TIMES DAILY AS NEEDED FOR SEVERE PAIN. 90 tablet 0   Upadacitinib ER ([name]) 15 MG TB24 Take 15 mg by mouth daily.     No current facility-administered medications on file prior to visit.     Review of Systems  Constitutional:  Negative for fever.  Eyes:  Negative for photophobia.  Respiratory:  Negative for cough, shortness of breath and wheezing.   Cardiovascular:  Positive for leg swelling. Negative for chest pain and palpitations.  Musculoskeletal:  Positive for arthralgias.  Neurological:  Positive for dizziness. Negative for light-headedness and headaches.       Objective:   Vitals:   04/03/23 1547  BP: 136/80  Pulse: 70  Temp: 98 F (36.7 C)  SpO2: 94%   BP Readings from Last 3 Encounters:  04/03/23 136/80  02/18/23 (!) 154/73  02/05/23 115/79   Wt Readings from Last 3 Encounters:  04/03/23 115 lb (52.2 kg)  02/18/23 117 lb 9.6 oz (53.3 kg)  12/24/22 117 lb (53.1 kg)   Body mass index is 22.09 kg/m.    Physical Exam Constitutional:      General: She is not in acute distress.    Appearance: Normal appearance.  HENT:     Head: Normocephalic and atraumatic.  Eyes:     Conjunctiva/sclera: Conjunctivae normal.  Cardiovascular:     Rate and Rhythm: Normal rate and regular rhythm.     Heart sounds: Normal heart sounds.  Pulmonary:     Effort: Pulmonary effort is normal. No respiratory distress.     Breath sounds: Normal breath sounds. No wheezing.  Musculoskeletal:        General: Deformity present.     Cervical back: Neck supple.     Right lower leg: No edema.     Left lower leg: No edema.  Lymphadenopathy:     Cervical: No cervical adenopathy.  Skin:    General: Skin is warm and dry.     Findings: No  rash.  Neurological:     Mental Status: She is alert. Mental status is at baseline.  Psychiatric:        Mood and Affect: Mood normal.        Behavior: Behavior normal.        Lab Results  Component Value Date   WBC 4.4 01/22/2023   HGB 12.3 01/22/2023   HCT 35.9 01/22/2023   PLT 307 01/22/2023   GLUCOSE 95 03/23/2023   CHOL 204 (H) 01/22/2023   TRIG 66 01/22/2023   HDL 101 01/22/2023   LDLDIRECT 122.9 11/15/2012   LDLCALC 88 01/22/2023   ALT 17 01/22/2023   AST 30 01/22/2023   NA 136 03/23/2023   K 4.9 03/23/2023   CL 97 (L) 03/23/2023   CREATININE 1.03 (H) 03/23/2023   BUN 16 03/23/2023   CO2 31 03/23/2023   TSH 1.37 04/10/2020   INR 0.95 12/16/2011   HGBA1C 5.6 10/24/2013     Assessment & Plan:    See Problem List for Assessment and Plan of chronic medical problems.

## 2023-04-02 NOTE — Patient Instructions (Addendum)
        Medications changes include :   none      Return in about 6 months (around 10/01/2023) for Physical Exam.

## 2023-04-03 ENCOUNTER — Encounter: Payer: Self-pay | Admitting: Internal Medicine

## 2023-04-03 ENCOUNTER — Ambulatory Visit (INDEPENDENT_AMBULATORY_CARE_PROVIDER_SITE_OTHER): Payer: Medicare Other | Admitting: Internal Medicine

## 2023-04-03 VITALS — BP 136/80 | HR 70 | Temp 98.0°F | Ht 60.5 in | Wt 115.0 lb

## 2023-04-03 DIAGNOSIS — G35 Multiple sclerosis: Secondary | ICD-10-CM | POA: Diagnosis not present

## 2023-04-03 DIAGNOSIS — R600 Localized edema: Secondary | ICD-10-CM | POA: Diagnosis not present

## 2023-04-03 DIAGNOSIS — M150 Primary generalized (osteo)arthritis: Secondary | ICD-10-CM

## 2023-04-03 DIAGNOSIS — I10 Essential (primary) hypertension: Secondary | ICD-10-CM

## 2023-04-03 DIAGNOSIS — K219 Gastro-esophageal reflux disease without esophagitis: Secondary | ICD-10-CM

## 2023-04-03 DIAGNOSIS — M0579 Rheumatoid arthritis with rheumatoid factor of multiple sites without organ or systems involvement: Secondary | ICD-10-CM | POA: Diagnosis not present

## 2023-04-03 DIAGNOSIS — E7849 Other hyperlipidemia: Secondary | ICD-10-CM | POA: Diagnosis not present

## 2023-04-03 DIAGNOSIS — M810 Age-related osteoporosis without current pathological fracture: Secondary | ICD-10-CM

## 2023-04-03 MED ORDER — NALOXEGOL OXALATE 12.5 MG PO TABS: 12.5000 mg | ORAL_TABLET | Freq: Every day | ORAL | 2 refills | Status: DC

## 2023-04-03 NOTE — Assessment & Plan Note (Signed)
Following with Dr. Corliss Skains On prednisone 5 mg daily, rinvoq 15 mg daily

## 2023-04-03 NOTE — Assessment & Plan Note (Signed)
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

## 2023-04-03 NOTE — Assessment & Plan Note (Signed)
Chronic Stable, controlled Continue Lasix 20 mg daily 

## 2023-04-03 NOTE — Assessment & Plan Note (Signed)
Chronic controlled Continue carvedilol 3.125 mg twice daily

## 2023-04-03 NOTE — Assessment & Plan Note (Signed)
Chronic Following with Dr Felecia Shelling Continue valium 5 mg HS prn for muscle spasms Continue tramadol 50 mg Q 6 hrs prn - typically takes 100 mg / day

## 2023-04-03 NOTE — Assessment & Plan Note (Signed)
Chronic  Continue atorvastatin 10 mg twice a week Regular exercise and healthy diet encouraged

## 2023-04-06 ENCOUNTER — Ambulatory Visit (INDEPENDENT_AMBULATORY_CARE_PROVIDER_SITE_OTHER): Payer: Self-pay | Admitting: Audiology

## 2023-04-06 DIAGNOSIS — H903 Sensorineural hearing loss, bilateral: Secondary | ICD-10-CM

## 2023-04-06 NOTE — Progress Notes (Signed)
  [address] [place], [place] [address] [phone]  Hearing Aid Check     Sherri [name] comes to drop her hearing aids for a check. The patient decided to wait for them to be checked.  Time in:3:30pm Time out:3:42om Accompanied OZ:HYQM   Right Left  Hearing aid manufacturer Phonak Audeo P50-R [id number] Deno Etienne P50-R [id number]  Hearing aid style Receiver in the canal Receiver in the canal  Hearing aid battery rechargeable rechargeable  Receiver [id number] [id number]  Dome/ custom earpiece Small vented dome Small double dome  Retention wire no no  Warranty expiration date 07-15-22 07-15-22  Loss and Damage 07-15-22 07-15-22  Additional accessories Expiration date    Initial fitting date 04-23-2020 04-23-2020  Device was fit at: [facility]  Hearing aid services unbundled  unbundled     Chief complaint: Patient reports she cannot hear with the hearing aids.  Actions taken: Inspection of the device and listening check showed that that they worked fine after replacing the dome and the wax filter. There was some wax in the left ear - not fully occluding and the right external ear canal is clear.  Services fee: $30 was paid at checkout.   Recommend: Return for a hearing aid check as needed. Return for a hearing evaluation and to see an ENT, if concerns with hearing changes arise.   Sherri [name] Sherri [name], AUD

## 2023-04-16 DIAGNOSIS — C44629 Squamous cell carcinoma of skin of left upper limb, including shoulder: Secondary | ICD-10-CM | POA: Diagnosis not present

## 2023-04-26 IMAGING — RF DG LUMBAR SPINE 2-3V
1 series · 2 of 2 positions shown · non-contrast
Comparison: None.

CLINICAL DATA: Fluoroscopic assistance for lumbar spine fusion

EXAM:
LUMBAR SPINE - 2-3 VIEW

[Series 1: run · 2 of 2 slices shown]
[im 1/2]
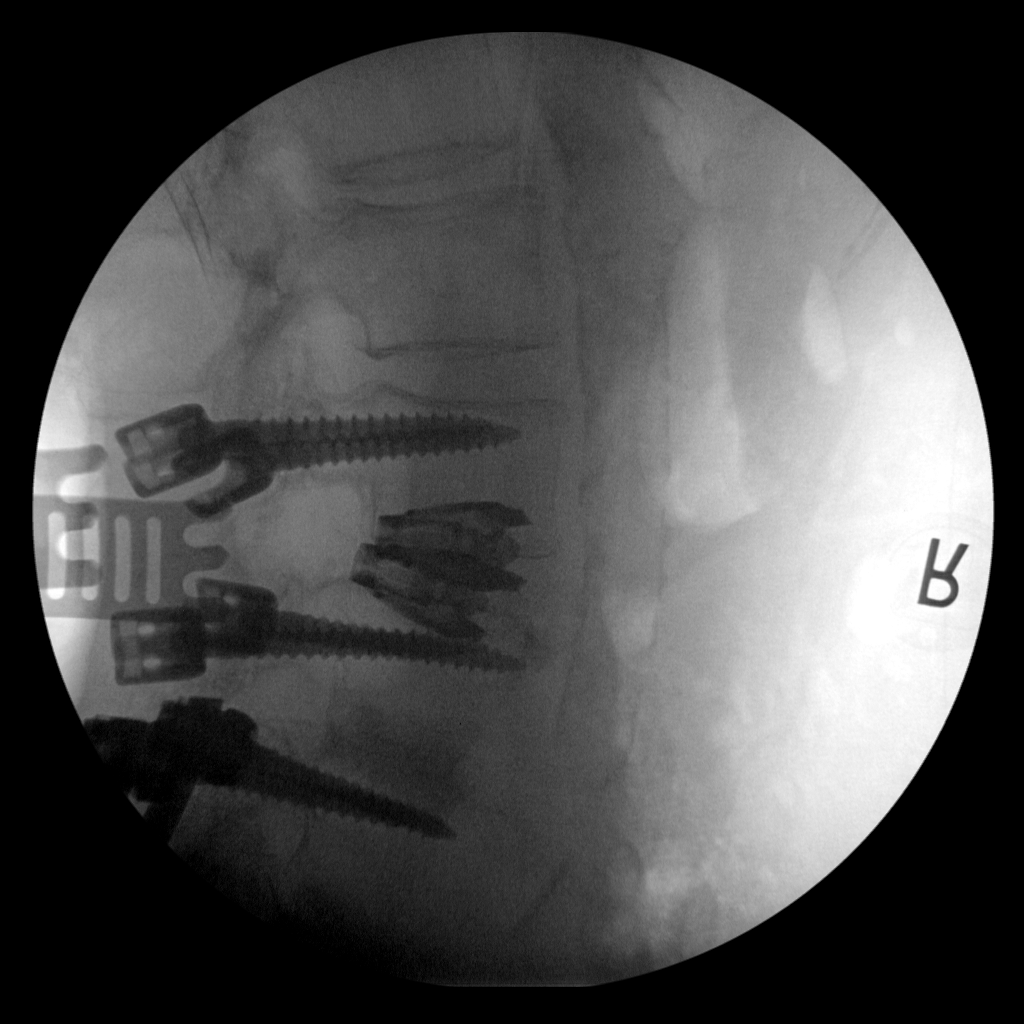
[im 2/2]
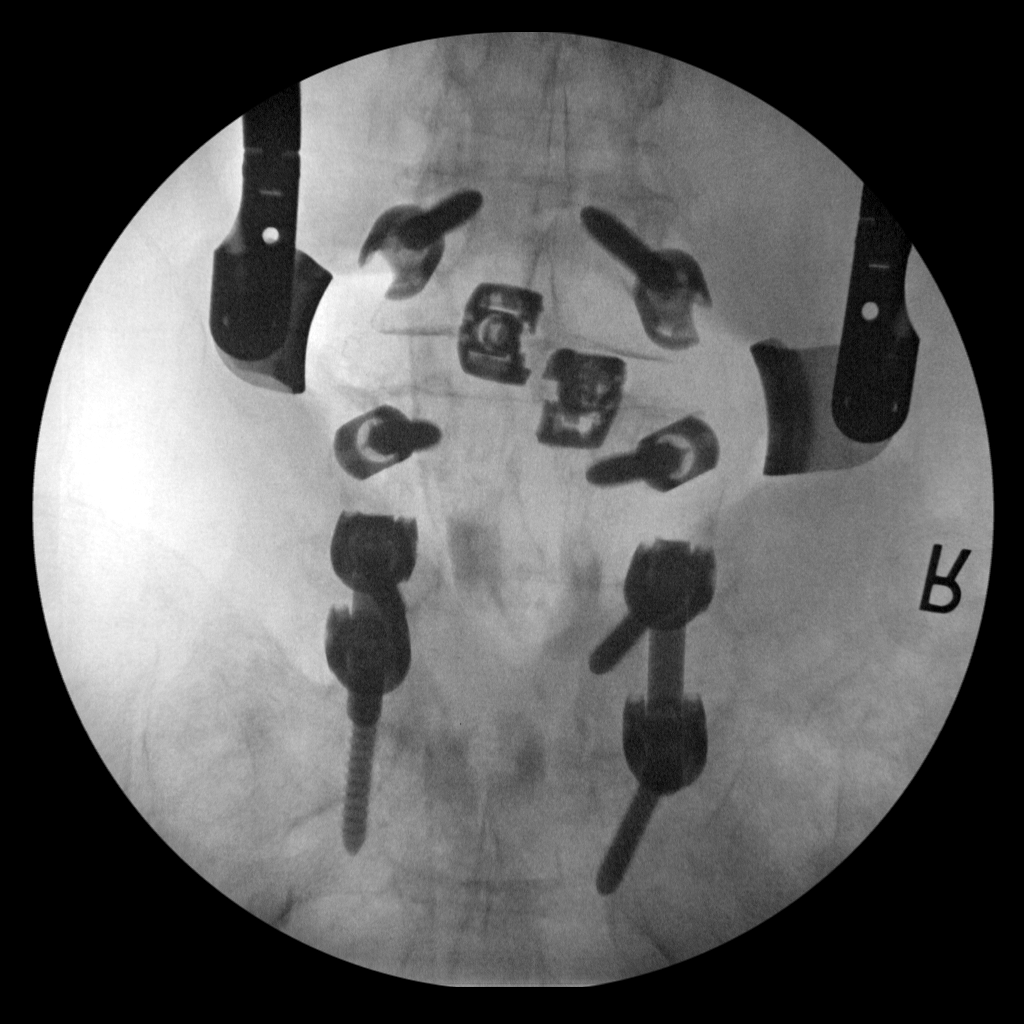

[2 of 2 positions shown; findings below may reference images not displayed]

FINDINGS: Surgical fusion is seen in the lower lumbar spine. There is interval
placement of intervertebral disc spacers at L4-L5 level.
Fluoroscopic time was 21 seconds. Radiation dose was 6.62 mGy.
IMPRESSION: Fluoroscopic assistance was provided for lumbar spine surgery.

## 2023-04-27 ENCOUNTER — Other Ambulatory Visit: Payer: Self-pay | Admitting: Internal Medicine

## 2023-04-28 DIAGNOSIS — D0461 Carcinoma in situ of skin of right upper limb, including shoulder: Secondary | ICD-10-CM | POA: Diagnosis not present

## 2023-04-28 DIAGNOSIS — D0462 Carcinoma in situ of skin of left upper limb, including shoulder: Secondary | ICD-10-CM | POA: Diagnosis not present

## 2023-04-29 DIAGNOSIS — Z1231 Encounter for screening mammogram for malignant neoplasm of breast: Secondary | ICD-10-CM | POA: Diagnosis not present

## 2023-05-07 ENCOUNTER — Telehealth: Payer: Self-pay | Admitting: *Deleted

## 2023-05-07 DIAGNOSIS — Z79899 Other long term (current) drug therapy: Secondary | ICD-10-CM

## 2023-05-07 NOTE — Telephone Encounter (Signed)
Patient contacted the office stating she needs a sample on [name]. Patient advised she is due to update her lab work. Patient states she will update labs tomorrow when she picks up her sample. Sample reserved in cabinet for patient.

## 2023-05-10 ENCOUNTER — Encounter: Payer: Self-pay | Admitting: Internal Medicine

## 2023-05-10 NOTE — Progress Notes (Unsigned)
    Subjective:    Patient ID: Sherri [name], female    DOB: 05-27-1940, 82 y.o.   MRN: [id number]      HPI [place] is here for No chief complaint on file.   Had Ct, saw GI 08/2022 for lower abd discomfort - ct neg.  Constipation, diverticulosis, pelvic floor dysfunction     Medications and allergies reviewed with patient and updated if appropriate.  Current Outpatient Medications on File Prior to Visit  Medication Sig Dispense Refill   atorvastatin (LIPITOR) 10 MG tablet TAKE 1 TAB ON MONDAYS AND FRIDAY 26 tablet 4   Biotin 5000 MCG TABS Take 10,000 mcg by mouth every evening.     Carboxymethylcellul-Glycerin (LUBRICATING EYE DROPS OP) Place 1 drop into both eyes daily as needed (dry eyes).     carvedilol (COREG) 3.125 MG tablet TAKE 1 TABLET BY MOUTH TWICE A DAY WITH FOOD 180 tablet 1   cholecalciferol (VITAMIN D) 25 MCG (1000 UNIT) tablet Take 1,000 Units by mouth in the morning.     cyclobenzaprine (FLEXERIL) 10 MG tablet TAKE 1 TABLET BY MOUTH THREE TIMES A DAY AS NEEDED FOR MUSCLE SPASM 90 tablet 0   denosumab (PROLIA) 60 MG/ML SOSY injection Inject 60 mg into the skin every 6 (six) months. Deliver to rheum: [address], [place], [place] [address]. Appt on 07/23/2022 1 mL 0   diazepam (VALIUM) 5 MG tablet TAKE 1 TABLET BY MOUTH AT BEDTIME AS NEEDED FOR MUSCLE SPASMS. 30 tablet 5   diclofenac sodium (VOLTAREN) 1 % GEL APPLY 3 GRAMS TO 3 LARGE JOINTS 3 TIMES A DAY AS NEEDED 100 g 0   DULoxetine (CYMBALTA) 30 MG capsule TAKE 1 CAPSULE BY MOUTH EVERY DAY 90 capsule 1   famotidine (PEPCID) 10 MG tablet Take 10 mg by mouth in the morning.     folic acid (FOLVITE) 1 MG tablet TAKE 1 TABLET BY MOUTH EVERY DAY 90 tablet 3   furosemide (LASIX) 20 MG tablet TAKE 1 TABLET BY MOUTH EVERY DAY 90 tablet 1   methotrexate (RHEUMATREX) 2.5 MG tablet Take 5 tablets (12.5 mg total) by mouth once a week. Caution:Chemotherapy. Protect from light.     naloxegol oxalate (MOVANTIK) 12.5  MG TABS tablet Take 1 tablet (12.5 mg total) by mouth daily. 30 tablet 2   predniSONE (DELTASONE) 5 MG tablet TAKE 1 TABLET BY MOUTH EVERY DAY IN THE MORNING 90 tablet 0   traMADol (ULTRAM) 50 MG tablet TAKE 1 TABLET (50 MG TOTAL) BY MOUTH 3 (THREE) TIMES DAILY AS NEEDED FOR SEVERE PAIN. 90 tablet 0   Upadacitinib ER ([name]) 15 MG TB24 Take 15 mg by mouth daily.     No current facility-administered medications on file prior to visit.    Review of Systems     Objective:  There were no vitals filed for this visit. BP Readings from Last 3 Encounters:  04/03/23 136/80  02/18/23 (!) 154/73  02/05/23 115/79   Wt Readings from Last 3 Encounters:  04/03/23 115 lb (52.2 kg)  02/18/23 117 lb 9.6 oz (53.3 kg)  12/24/22 117 lb (53.1 kg)   There is no height or weight on file to calculate BMI.    Physical Exam         Assessment & Plan:    See Problem List for Assessment and Plan of chronic medical problems.

## 2023-05-11 ENCOUNTER — Ambulatory Visit: Payer: Medicare Other | Admitting: Internal Medicine

## 2023-05-11 NOTE — Progress Notes (Deleted)
Office Visit Note  Patient: Sherri [name]             Date of Birth: [birth year]           MRN: [id number]             PCP: Pincus Sanes, MD Referring: Pincus Sanes, MD Visit Date: 05/25/2023 Occupation: @GUAROCC @  Subjective:  No chief complaint on file.   History of Present Illness: Sherri [name] is a 82 y.o. female ***     Activities of Daily Living:  Patient reports morning stiffness for *** {minute/hour:19697}.   Patient {ACTIONS;DENIES/REPORTS:21021675::"Denies"} nocturnal pain.  Difficulty dressing/grooming: {ACTIONS;DENIES/REPORTS:21021675::"Denies"} Difficulty climbing stairs: {ACTIONS;DENIES/REPORTS:21021675::"Denies"} Difficulty getting out of chair: {ACTIONS;DENIES/REPORTS:21021675::"Denies"} Difficulty using hands for taps, buttons, cutlery, and/or writing: {ACTIONS;DENIES/REPORTS:21021675::"Denies"}  No Rheumatology ROS completed.   PMFS History:  Patient Active Problem List   Diagnosis Date Noted  . 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 Cervical spondylosis with myelopathy and radiculopathy 04/23/2022  . Constipation 03/04/2022  . Upper back pain on left side 03/04/2022  . Lumbar vertebral fracture, pathologic 09/20/2021  . Degenerative spondylolisthesis 08/19/2021  . Acute left lumbar radiculopathy 07/05/2021  . Senile purpura (HCC) 12/19/2020  . Restless leg 02/02/2020  . Bilateral leg edema 01/11/2020  . Cervical radiculopathy at C8 06/28/2019  . Carpal tunnel syndrome on both sides 06/28/2019  . Right sided sciatica 04/14/2018  . Trochanteric bursitis of right hip 04/14/2018  . H/O cervical spine surgery 11/10/2017  . Dysphagia 10/26/2017  . Gastroesophageal reflux disease 08/06/2017  . Easy bruising 08/06/2017  . Mid back pain 03/26/2017  . Submandibular gland swelling 01/08/2017  . High risk medication use 09/02/2016  . Piriformis muscle pain 09/02/2016  . Right hip pain 04/08/2016  . Right knee pain  02/25/2016  . Numbness and tingling in left hand 11/14/2014  . Biceps tendon tear 10/10/2014  . Insomnia 10/10/2014  . Gait disturbance 06/13/2014  . OP (osteoporosis) 01/18/2014  . Other long term (current) drug therapy 01/18/2014  . 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 History of colonic polyps 07/03/2010  . Nocturia 05/28/2010  . THYROID NODULE 05/17/2008  . Multiple sclerosis (HCC) 05/17/2008  . Other fatigue 03/30/2007  . Hyperlipidemia 08/20/2006  . Essential hypertension 08/20/2006  . Rheumatoid arthritis (HCC) 08/20/2006  . Osteoarthritis 08/20/2006  . CERVICAL CANCER, HX OF 08/20/2006  . GASTRIC ULCER, HX OF 08/20/2006    Past Medical History:  Diagnosis Date  . Anxiety    takes Valium daily as needed  . Basal cell carcinoma 01/21/1988   left nostril (MOHS), sup-right calf (CX35FU)  . Basal cell carcinoma 03/22/1996   right calf (CX35FU)  . Basal cell carcinoma 03/31/1995   left wing nose  . Bruises easily    d/t meds  . Cancer (HCC)    basal cell ca, in situ- uterine   . Cataracts, bilateral    removed bilateral  . Chronic back pain   . Dizziness    r/t to meds  . GERD (gastroesophageal reflux disease)    no meds on a regular basis but will take Tums if needed  . Headache(784.0)    r/t neck issues  . History of bronchitis 6-45yrs ago  . History of colon polyps    benign  . HOH (hard of hearing)    wears hearing aids  . Hyperlipidemia    takes Atorvastatin on Mondays and Fridays  . Hypertension   . Joint pain   .  Joint swelling   . Multiple sclerosis (HCC)   . Neuromuscular disorder (HCC)    Dr. Cissna Park [facility]- [facility], follows [facility]  . Nocturia   . PONV (postoperative nausea and vomiting)    trouble urinating after surgery in 2014  . Postoperative nausea and vomiting 01/11/2019  . Rheumatoid arthritis (HCC)    Dr Glo Herring weekly, RA- hands- knees- feet   . Rheumatoid arthritis(714.0)    Dr Corliss Skains takes Harriette Ohara  daily  . Right wrist fracture   . Skin cancer   . Squamous cell carcinoma of skin 11/02/2001   in situ-right knee (cx10fu)  . Squamous cell carcinoma of skin 11/12/2011   in situ-left forearm (CX35FU), in situ-left foot (CX35FU)  . Squamous cell carcinoma of skin 01/22/2012   in situ-left lower forearm (txpbx)  . Squamous cell carcinoma of skin 11/17/2012   right shin (txpbx)  . Squamous cell carcinoma of skin 10/28/2013   in situ-right shoulder (CX35FU)  . Squamous cell carcinoma of skin 04/28/2014   well diff-right shin (txpbx)  . Squamous cell carcinoma of skin 11/02/2014   in situ-Left shin (txpbx)  . Squamous cell carcinoma of skin 12/15/2014   in situ-Left hand (txpbx), bowens-left side chest (txpbx)  . Squamous cell carcinoma of skin 05/09/2015   in situ-left hand (txpbx)   . Squamous cell carcinoma of skin 05/07/2016   in situ-left inner shin,ant, in situ-left inner shin, post, in situ-left outer forearm, in situ-right knuckle  . Squamous cell carcinoma of skin 03/1302018   in situ-left shoulder (txpbx), in situ-right inner shin (txpbx), in situ-top of left foot (txpbx), KA- left forearm (txpbx)  . Squamous cell carcinoma of skin 12/02/2016   in situ-left inner shin (txpbx)  . Squamous cell carcinoma of skin 03/04/2017   in situ-left outer sup, shin (txpbx), in situ- right 2nd knuckle finger (txpbx), in situ- Left wrist (txpbx)  . Squamous cell carcinoma of skin 04/06/2018   in situ-above left knee inner (txpbx)  . Squamous cell carcinoma of skin 04/21/2018   in situ-right top hand (txpbx)  . Squamous cell carcinoma of skin 07/08/2018   in situ-right lower inner shin (txpbx)  . Squamous cell carcinoma of skin 04/27/2019   in situ- Left neck(CX35FU), In situ- right neck (CX35FU)  . Urinary retention    sees Dr.Wrenn about 2 times a yr    Family History  Problem Relation Age of Onset  . Cancer Mother        pancreatic  . Diabetes Mother   . Pancreatic cancer Mother    . Heart disease Father        Rheumatic  . Cancer Father        ? stomach  . Stomach cancer Father   . Cancer Sister        stomach  . Stomach cancer Sister   . Cancer Maternal Aunt        X 4; ? primary  . Heart disease Paternal Aunt   . Cancer Maternal Grandmother        cervical  . Colon cancer Other        Aunts  . Multiple sclerosis Daughter   . Hypertension Neg Hx   . Stroke Neg Hx   . Esophageal cancer Neg Hx   . Rectal cancer Neg Hx    Past Surgical History:  Procedure Laterality Date  . ABDOMINAL HYSTERECTOMY     1985  . ANTERIOR CERVICAL DECOMP/DISCECTOMY FUSION N/A 04/23/2022   Procedure: Anterior  Cervical Decompression/Discectomy Fusion - Cervical Seven-Thoracic One;  Surgeon: Julio Sicks, MD;  Location: [facility];  Service: Neurosurgery;  Laterality: N/A;  3C  . APPENDECTOMY     with TAH  . BACK SURGERY     several  . BREAST BIOPSY Right    Results were negative  . BROW LIFT Bilateral 10/02/2016   Procedure: BILATERAL LOWER LID BLEPHAROPLASTY;  Surgeon: Floydene Flock, MD;  Location: [facility] OR;  Service: Plastics;  Laterality: Bilateral;  . CARPAL TUNNEL RELEASE Right   . cataracts    . CERVICAL FUSION     Dr Jeral Fruit  . CERVICAL FUSION  12/31/2012   Dr Jeral Fruit  . CERVICAL FUSION  04/2022  . CHEST TUBE INSERTION     for traumatic Pneumothorax  . COLONOSCOPY  07/16/2010   normal   . COLONOSCOPY W/ POLYPECTOMY  1997   negative since; Dr Arlyce Dice  . ESOPHAGOGASTRODUODENOSCOPY  07/16/2010   normal  . eye lid raise    . EYE SURGERY Bilateral    cataracts removed - /w IOL  . HARDWARE REMOVAL  08/2021  . JOINT REPLACEMENT    . LUMBAR FUSION     Dr Jeral Fruit  . NASAL SINUS SURGERY    . POSTERIOR CERVICAL FUSION/FORAMINOTOMY N/A 12/31/2012   Procedure: POSTERIOR LATERAL CERVICAL FUSION/FORAMINOTOMY LEVEL 1 CERVICAL THREE-FOUR WITH LATERAL MASS SCREWS;  Surgeon: Karn Cassis, MD;  Location: [facility];  Service: Neurosurgery;  Laterality: N/A;  . PTOSIS  REPAIR Bilateral 10/02/2016   Procedure: INTERNAL PTOSIS REPAIR;  Surgeon: Floydene Flock, MD;  Location: [facility] OR;  Service: Plastics;  Laterality: Bilateral;  . SKIN BIOPSY    . THYROID SURGERY     R lobe removed- 1972, has grown back - CT last done- 2018  . TOTAL HIP ARTHROPLASTY  12/22/2011   Procedure: TOTAL HIP ARTHROPLASTY;  Surgeon: Nestor Lewandowsky, MD;  Location: [facility] OR;  Service: Orthopedics;  Laterality: Left;  . TOTAL SHOULDER ARTHROPLASTY    . TUBAL LIGATION    . 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 WRIST SURGERY Left 09/16/2022   wrist/arm   Social History   Social History Narrative  . Not on file   Immunization History  Administered Date(s) Administered  . Fluad Quad(high Dose 65+) 02/02/2020, 02/01/2022  . Influenza Whole 02/22/2007, 02/08/2013  . Influenza, High Dose Seasonal PF 02/04/2015, 02/18/2017, 02/21/2018, 01/16/2019, 01/25/2021, 02/02/2023  . Influenza-Unspecified 01/24/2014, 01/25/2016, 02/18/2017, 01/16/2019, 01/25/2020  . Moderna Covid-19 Fall Seasonal Vaccine 56yrs & older 02/02/2023  . Moderna Covid-19 Vaccine Bivalent Booster 63yrs & up 02/11/2022  . PFIZER(Purple Top)SARS-COV-2 Vaccination 06/18/2019, 07/09/2019, 01/09/2020, 07/03/2020, 02/07/2021  . PNEUMOCOCCAL CONJUGATE-20 02/26/2022  . [employer], [employer] 18yrs & up 02/08/2021  . Pneumococcal Conjugate-13 11/23/2012, 09/08/2014  . Pneumococcal Polysaccharide-23 05/26/2006  . Pneumococcal-Unspecified 12/28/2012, 06/09/2020  . Respiratory Syncytial Virus Vaccine,Recomb Aduvanted(Arexvy) 01/23/2022  . Td 05/28/2010  . Tdap 12/24/2020, 01/07/2021  . Unspecified SARS-COV-2 Vaccination 06/26/2020, 02/11/2022  . Varicella 11/07/2020  . Zoster Recombinant(Shingrix) 02/10/2019, 04/14/2019  . Zoster, Live 05/26/2009, 07/10/2020     Objective: Vital Signs: There were no vitals taken for this visit.   Physical Exam   Musculoskeletal  Exam: ***  CDAI Exam: CDAI Score: -- Patient Global: --; Provider Global: -- Swollen: --; Tender: -- Joint Exam 05/25/2023   No joint exam has been documented for this visit   There is currently no information documented on the homunculus. Go  to the Rheumatology activity and complete the homunculus joint exam.  Investigation: No additional findings.  Imaging: No results found.  Recent Labs: Lab Results  Component Value Date   WBC 4.4 01/22/2023   HGB 12.3 01/22/2023   PLT 307 01/22/2023   NA 136 03/23/2023   K 4.9 03/23/2023   CL 97 (L) 03/23/2023   CO2 31 03/23/2023   GLUCOSE 95 03/23/2023   BUN 16 03/23/2023   CREATININE 1.03 (H) 03/23/2023   BILITOT 0.6 01/22/2023   ALKPHOS 29 (L) 04/10/2020   AST 30 01/22/2023   ALT 17 01/22/2023   PROT 6.8 01/22/2023   ALBUMIN 4.2 04/10/2020   CALCIUM 9.5 03/23/2023   GFRAA 65 09/26/2020   QFTBGOLDPLUS NEGATIVE 11/03/2022    Speciality Comments: Prior therapy includes: Harriette Ohara (cost), Olumiant-inadequate response, Imuran weakness, leflunomide-intolerance Prolia: 05/04/18, 11/12/18, 06/09/19, 12/07/19, 07/05/20, 01/01/21, 07/09/21, 01/09/22, 07/24/22  Procedures:  No procedures performed Allergies: Darvon [propoxyphene hcl], Demerol [meperidine], Penicillins, Imuran [azathioprine], Sulfa antibiotics, Arava [leflunomide], Lipitor [atorvastatin], and Vytorin [ezetimibe-simvastatin]   Assessment / Plan:     Visit Diagnoses: Rheumatoid arthritis involving multiple sites with positive rheumatoid factor (HCC)  High risk medication use  Long term (current) use of systemic steroids  Ganglion cyst of volar aspect of right wrist  Pain in both hands  Primary osteoarthritis of right hip  History of total hip replacement, left  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  Degeneration of intervertebral disc of lumbar region without discogenic back pain or lower extremity pain  Age-related osteoporosis without current pathological  fracture  Closed fracture of right wrist with routine healing, subsequent encounter  Closed fracture of left wrist with routine healing, subsequent encounter  Vitamin D deficiency  Other fatigue  Primary insomnia  History of hypertension  History of hyperlipidemia  History of multiple sclerosis (HCC)  History of depression  History of anxiety  History of cellulitis  Orders: No orders of the defined types were placed in this encounter.  No orders of the defined types were placed in this encounter.   Face-to-face time spent with patient was *** minutes. Greater than 50% of time was spent in counseling and coordination of care.  Follow-Up Instructions: No follow-ups on file.   Gearldine Bienenstock, PA-C  Note - This record has been created using Dragon software.  Chart creation errors have been sought, but may not always  have been located. Such creation errors do not reflect on  the standard of medical care.

## 2023-05-13 DIAGNOSIS — M4310 Spondylolisthesis, site unspecified: Secondary | ICD-10-CM | POA: Diagnosis not present

## 2023-05-13 DIAGNOSIS — M4802 Spinal stenosis, cervical region: Secondary | ICD-10-CM | POA: Diagnosis not present

## 2023-05-14 ENCOUNTER — Telehealth: Payer: Self-pay | Admitting: Internal Medicine

## 2023-05-14 ENCOUNTER — Other Ambulatory Visit: Payer: Self-pay | Admitting: *Deleted

## 2023-05-14 DIAGNOSIS — Z79899 Other long term (current) drug therapy: Secondary | ICD-10-CM | POA: Diagnosis not present

## 2023-05-14 NOTE — Telephone Encounter (Signed)
Copied from CRM [id number]. Topic: Clinical - Medical Advice >> May 14, 2023  1:09 PM Sherri [name] wrote: Reason for CRM: Patient calling because suffers from chronic constipation. Has tried several medications in the last 24 hours, feeling very constipated still and would like medication or medical advice.

## 2023-05-15 LAB — CBC WITH DIFFERENTIAL/PLATELET
Absolute Lymphocytes: 546 {cells}/uL — ABNORMAL LOW (ref 850–3900)
Absolute Monocytes: 554 {cells}/uL (ref 200–950)
Basophils Absolute: 12 {cells}/uL (ref 0–200)
Basophils Relative: 0.3 %
Eosinophils Absolute: 31 {cells}/uL (ref 15–500)
Eosinophils Relative: 0.8 %
HCT: 34.6 % — ABNORMAL LOW (ref 35.0–45.0)
Hemoglobin: 11.8 g/dL (ref 11.7–15.5)
MCH: 33.8 pg — ABNORMAL HIGH (ref 27.0–33.0)
MCHC: 34.1 g/dL (ref 32.0–36.0)
MCV: 99.1 fL (ref 80.0–100.0)
MPV: 9.9 fL (ref 7.5–12.5)
Monocytes Relative: 14.2 %
Neutro Abs: 2757 {cells}/uL (ref 1500–7800)
Neutrophils Relative %: 70.7 %
Platelets: 255 10*3/uL (ref 140–400)
RBC: 3.49 10*6/uL — ABNORMAL LOW (ref 3.80–5.10)
RDW: 12.9 % (ref 11.0–15.0)
Total Lymphocyte: 14 %
WBC: 3.9 10*3/uL (ref 3.8–10.8)

## 2023-05-15 LAB — COMPLETE METABOLIC PANEL WITH GFR
AG Ratio: 1.8 (calc) (ref 1.0–2.5)
ALT: 16 U/L (ref 6–29)
AST: 22 U/L (ref 10–35)
Albumin: 3.7 g/dL (ref 3.6–5.1)
Alkaline phosphatase (APISO): 31 U/L — ABNORMAL LOW (ref 37–153)
BUN: 10 mg/dL (ref 7–25)
CO2: 29 mmol/L (ref 20–32)
Calcium: 8.9 mg/dL (ref 8.6–10.4)
Chloride: 103 mmol/L (ref 98–110)
Creat: 0.95 mg/dL (ref 0.60–0.95)
Globulin: 2.1 g/dL (ref 1.9–3.7)
Glucose, Bld: 86 mg/dL (ref 65–99)
Potassium: 4.7 mmol/L (ref 3.5–5.3)
Sodium: 138 mmol/L (ref 135–146)
Total Bilirubin: 0.4 mg/dL (ref 0.2–1.2)
Total Protein: 5.8 g/dL — ABNORMAL LOW (ref 6.1–8.1)
eGFR: 60 mL/min/{1.73_m2} (ref >=60)

## 2023-05-15 NOTE — Telephone Encounter (Signed)
Message left for patient to return call to clinic.  If she calls back please provide Dr. Raphael Gibney recommendations and let her know this only a one time dose.   My-chart sent to her as well.

## 2023-05-15 NOTE — Telephone Encounter (Signed)
Medication Samples have been provided to the patient.  Drug name: [name]       Strength: 15 mg        Qty: 2  LOT: [id number]  Exp.Date: 02/15/2024  Dosing instructions: Take one tablet by mouth daily.

## 2023-05-15 NOTE — Progress Notes (Signed)
CBC and CMP are stable.  Lymphocyte count is low due to immunosuppression.  We will continue to monitor labs.

## 2023-05-15 NOTE — Telephone Encounter (Signed)
Left message for patient to return call to clinic.  If she calls back please ask the following and send responses back:  Name of medications tried over the past 24 hours? (As stated in previous message) Is she still taking the movantik?

## 2023-05-15 NOTE — Telephone Encounter (Signed)
Copied from CRM [id number]. Topic: General - Other >> May 15, 2023  1:03 PM Prudencio Pair wrote: Reason for CRM: Patient returning phone call from [place]. Asked patient the questions left in chart message. Patient stated that she is not still taking the Movantik. She states that it didn't do her any good. The past medications that she has tried over the last 24 hours are the Dulcolax laxatives. She stated that she did take 3 of those last night and it was a watery stool but feels like it didn't do as much as it should have. Patient also stated that she has tried Linzess which did not work for her, stool softeners which has not helped, pt states she did that for a few months. Also has tried an enamel & suppository and neither worked. Patient states that she knows Dr. Lawerance Bach is trying but she just doesn't know what else could work. Please give patient a call back to further discuss. CB #: [phone].

## 2023-05-15 NOTE — Telephone Encounter (Signed)
What meds has she tried?  Is she still taking the movantik?

## 2023-05-15 NOTE — Telephone Encounter (Signed)
Ok to try the "miralax purge"  Ok to take miralax 17 gm by mouth every hour for 4 hours (so 4 doses) and see how this works - and remember this is an only 1 time treatment, so this is not meant to be a regular treatment

## 2023-05-22 ENCOUNTER — Other Ambulatory Visit: Payer: Self-pay | Admitting: Rheumatology

## 2023-05-22 NOTE — Telephone Encounter (Signed)
Last Fill: 03/23/2023  Next Visit: 06/15/2023  Last Visit: 02/18/2023  Dx: Rheumatoid arthritis involving multiple sites with positive rheumatoid factor   Current Dose per office note on 02/18/2023: prednisone 5 mg 1 tablet by mouth daily   Okay to refill Prednisone?

## 2023-05-25 ENCOUNTER — Ambulatory Visit: Payer: Medicare Other | Admitting: Physician Assistant

## 2023-05-25 DIAGNOSIS — Z79899 Other long term (current) drug therapy: Secondary | ICD-10-CM

## 2023-05-25 DIAGNOSIS — Z7952 Long term (current) use of systemic steroids: Secondary | ICD-10-CM

## 2023-05-25 DIAGNOSIS — M67431 Ganglion, right wrist: Secondary | ICD-10-CM

## 2023-05-25 DIAGNOSIS — Z8659 Personal history of other mental and behavioral disorders: Secondary | ICD-10-CM

## 2023-05-25 DIAGNOSIS — M51369 Other intervertebral disc degeneration, lumbar region without mention of lumbar back pain or lower extremity pain: Secondary | ICD-10-CM

## 2023-05-25 DIAGNOSIS — Z8679 Personal history of other diseases of the circulatory system: Secondary | ICD-10-CM

## 2023-05-25 DIAGNOSIS — M0579 Rheumatoid arthritis with rheumatoid factor of multiple sites without organ or systems involvement: Secondary | ICD-10-CM

## 2023-05-25 DIAGNOSIS — Z8639 Personal history of other endocrine, nutritional and metabolic disease: Secondary | ICD-10-CM

## 2023-05-25 DIAGNOSIS — G35 Multiple sclerosis: Secondary | ICD-10-CM

## 2023-05-25 DIAGNOSIS — E559 Vitamin D deficiency, unspecified: Secondary | ICD-10-CM

## 2023-05-25 DIAGNOSIS — M810 Age-related osteoporosis without current pathological fracture: Secondary | ICD-10-CM

## 2023-05-25 DIAGNOSIS — F5101 Primary insomnia: Secondary | ICD-10-CM

## 2023-05-25 DIAGNOSIS — R5383 Other fatigue: Secondary | ICD-10-CM

## 2023-05-25 DIAGNOSIS — Z96642 Presence of left artificial hip joint: Secondary | ICD-10-CM

## 2023-05-25 DIAGNOSIS — M79641 Pain in right hand: Secondary | ICD-10-CM

## 2023-05-25 DIAGNOSIS — Z872 Personal history of diseases of the skin and subcutaneous tissue: Secondary | ICD-10-CM

## 2023-05-25 DIAGNOSIS — M4312 Spondylolisthesis, cervical region: Secondary | ICD-10-CM

## 2023-05-25 DIAGNOSIS — S62101D Fracture of unspecified carpal bone, right wrist, subsequent encounter for fracture with routine healing: Secondary | ICD-10-CM

## 2023-05-25 DIAGNOSIS — M1611 Unilateral primary osteoarthritis, right hip: Secondary | ICD-10-CM

## 2023-05-25 DIAGNOSIS — S62102D Fracture of unspecified carpal bone, left wrist, subsequent encounter for fracture with routine healing: Secondary | ICD-10-CM

## 2023-06-01 NOTE — Progress Notes (Unsigned)
Office Visit Note  Patient: Sherri [name]             Date of Birth: April 28, 1941           MRN: [id number]             PCP: Pincus Sanes, MD Referring: Pincus Sanes, MD Visit Date: 06/15/2023 Occupation: @GUAROCC @  Subjective:  Chronic pain   History of Present Illness: Sherri [name] is a 83 y.o. female with history of seropositive rheumatoid arthritis and osteoporosis.  Patient remains on  [name] 15 mg 1 tablet by mouth daily, Methotrexate 5 tablets weekly, folic acid 1 mg by mouth daily, prednisone 5 mg 1 tablet by mouth daily.  She is tolerating combination therapy without any side effects.  Patient reports that she is experiencing increased generalized pain which has been making it difficult to sleep at night.  Patient reports that she discontinued both tramadol and Cymbalta 2 to 3 months ago which she feels has exacerbated her symptoms.  Patient states that there was a concern for an increased risk for serotonin syndrome so she discontinued both medications.  Her pain levels have been significantly interfering with her quality of life.  She has been waking up throughout the night with nocturnal pain.  She has had pain in both hands despite using topical roll-on analgesics. Patient denies any recent infections. Patient's last Prolia injection was administered on 02/05/2023.   Activities of Daily Living:  Patient reports morning stiffness for all day. Patient Reports nocturnal pain.  Difficulty dressing/grooming: Reports Difficulty climbing stairs: Reports Difficulty getting out of chair: Reports Difficulty using hands for taps, buttons, cutlery, and/or writing: Reports  Review of Systems  Constitutional:  Positive for fatigue.  HENT:  Negative for mouth sores, mouth dryness and nose dryness.   Eyes:  Positive for dryness. Negative for pain.  Respiratory:  Negative for cough and shortness of breath.   Cardiovascular:  Negative for chest pain and palpitations.   Gastrointestinal:  Positive for constipation. Negative for blood in stool and diarrhea.  Endocrine: Negative for increased urination.  Genitourinary:  Negative for involuntary urination.  Musculoskeletal:  Positive for joint pain, gait problem, joint pain, joint swelling, myalgias, muscle weakness, morning stiffness, muscle tenderness and myalgias.  Skin:  Negative for color change, rash, hair loss and sensitivity to sunlight.  Allergic/Immunologic: Negative for susceptible to infections.  Neurological:  Positive for dizziness. Negative for headaches.  Hematological:  Negative for swollen glands.  Psychiatric/Behavioral:  Positive for sleep disturbance. Negative for depressed mood. The patient is not nervous/anxious.     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   Cervical spondylosis with myelopathy and radiculopathy 04/23/2022   Constipation 03/04/2022   Upper back pain on left side 03/04/2022   Lumbar vertebral fracture, pathologic 09/20/2021   Degenerative spondylolisthesis 08/19/2021   Acute left lumbar radiculopathy 07/05/2021   Senile purpura (HCC) 12/19/2020   Restless leg 02/02/2020   Bilateral leg edema 01/11/2020   Cervical radiculopathy at C8 06/28/2019   Carpal tunnel syndrome on both sides 06/28/2019   Right sided sciatica 04/14/2018   Trochanteric bursitis of right hip 04/14/2018   H/O cervical spine surgery 11/10/2017   Dysphagia 10/26/2017   Gastroesophageal reflux disease 08/06/2017   Easy bruising 08/06/2017   Mid back pain 03/26/2017   Submandibular gland swelling 01/08/2017   High risk medication use 09/02/2016  Piriformis muscle pain 09/02/2016   Right hip pain 04/08/2016   Right knee pain 02/25/2016   Numbness and tingling in left hand 11/14/2014   Biceps tendon tear 10/10/2014   Insomnia 10/10/2014   Gait disturbance 06/13/2014   OP (osteoporosis) 01/18/2014    Other long term (current) drug therapy 01/18/2014   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 01/01/2013   History of colonic polyps 07/03/2010   Nocturia 05/28/2010   THYROID NODULE 05/17/2008   Multiple sclerosis (HCC) 05/17/2008   Other fatigue 03/30/2007   Hyperlipidemia 08/20/2006   Essential hypertension 08/20/2006   Rheumatoid arthritis (HCC) 08/20/2006   Osteoarthritis 08/20/2006   CERVICAL CANCER, HX OF 08/20/2006   GASTRIC ULCER, HX OF 08/20/2006    Past Medical History:  Diagnosis Date   Anxiety    takes Valium daily as needed   Basal cell carcinoma 01/21/1988   left nostril (MOHS), sup-right calf (CX35FU)   Basal cell carcinoma 03/22/1996   right calf (CX35FU)   Basal cell carcinoma 03/31/1995   left wing nose   Bruises easily    d/t meds   Cancer (HCC)    basal cell ca, in situ- uterine    Cataracts, bilateral    removed bilateral   Chronic back pain    Dizziness    r/t to meds   GERD (gastroesophageal reflux disease)    no meds on a regular basis but will take Tums if needed   Headache(784.0)    r/t neck issues   History of bronchitis 6-84yrs ago   History of colon polyps    benign   HOH (hard of hearing)    wears hearing aids   Hyperlipidemia    takes Atorvastatin on Mondays and Fridays   Hypertension    Joint pain    Joint swelling    Multiple sclerosis (HCC)    Neuromuscular disorder (HCC)    Dr. Stamford Nation- [facility], follows [facility]   Nocturia    PONV (postoperative nausea and vomiting)    trouble urinating after surgery in 2014   Postoperative nausea and vomiting 01/11/2019   Rheumatoid arthritis (HCC)    Dr Glo Herring weekly, RA- hands- knees- feet    Rheumatoid arthritis(714.0)    Dr Corliss Skains takes Harriette Ohara daily   Right wrist fracture    Skin cancer    Squamous cell carcinoma of skin 11/02/2001   in situ-right knee (cx88fu)   Squamous cell carcinoma of skin 11/12/2011   in situ-left forearm (CX35FU), in situ-left foot  (CX35FU)   Squamous cell carcinoma of skin 01/22/2012   in situ-left lower forearm (txpbx)   Squamous cell carcinoma of skin 11/17/2012   right shin (txpbx)   Squamous cell carcinoma of skin 10/28/2013   in situ-right shoulder (CX35FU)   Squamous cell carcinoma of skin 04/28/2014   well diff-right shin (txpbx)   Squamous cell carcinoma of skin 11/02/2014   in situ-Left shin (txpbx)   Squamous cell carcinoma of skin 12/15/2014   in situ-Left hand (txpbx), bowens-left side chest (txpbx)   Squamous cell carcinoma of skin 05/09/2015   in situ-left hand (txpbx)    Squamous cell carcinoma of skin 05/07/2016   in situ-left inner shin,ant, in situ-left inner shin, post, in situ-left outer forearm, in situ-right knuckle   Squamous cell carcinoma of skin 03/1302018   in situ-left shoulder (txpbx), in situ-right inner shin (txpbx), in situ-top of left foot (txpbx), KA- left forearm (txpbx)   Squamous cell carcinoma of skin 12/02/2016  in situ-left inner shin (txpbx)   Squamous cell carcinoma of skin 03/04/2017   in situ-left outer sup, shin (txpbx), in situ- right 2nd knuckle finger (txpbx), in situ- Left wrist (txpbx)   Squamous cell carcinoma of skin 04/06/2018   in situ-above left knee inner (txpbx)   Squamous cell carcinoma of skin 04/21/2018   in situ-right top hand (txpbx)   Squamous cell carcinoma of skin 07/08/2018   in situ-right lower inner shin (txpbx)   Squamous cell carcinoma of skin 04/27/2019   in situ- Left neck(CX35FU), In situ- right neck (CX35FU)   Urinary retention    sees Dr.Wrenn about 2 times a yr    Family History  Problem Relation Age of Onset   Cancer Mother        pancreatic   Diabetes Mother    Pancreatic cancer Mother    Heart disease Father        Rheumatic   Cancer Father        ? stomach   Stomach cancer Father    Cancer Sister        stomach   Stomach cancer Sister    Cancer Maternal Aunt        X 4; ? primary   Heart disease Paternal Aunt     Cancer Maternal Grandmother        cervical   Colon cancer Other        Aunts   Multiple sclerosis Daughter    Hypertension Neg Hx    Stroke Neg Hx    Esophageal cancer Neg Hx    Rectal cancer Neg Hx    Past Surgical History:  Procedure Laterality Date   ABDOMINAL HYSTERECTOMY     1985   ANTERIOR CERVICAL DECOMP/DISCECTOMY FUSION N/A 04/23/2022   Procedure: Anterior Cervical Decompression/Discectomy Fusion - Cervical Seven-Thoracic One;  Surgeon: Julio Sicks, MD;  Location: [facility] OR;  Service: Neurosurgery;  Laterality: N/A;  3C   APPENDECTOMY     with TAH   BACK SURGERY     several   BREAST BIOPSY Right    Results were negative   BROW LIFT Bilateral 10/02/2016   Procedure: BILATERAL LOWER LID BLEPHAROPLASTY;  Surgeon: Floydene Flock, MD;  Location: [facility] OR;  Service: Plastics;  Laterality: Bilateral;   CARPAL TUNNEL RELEASE Right    cataracts     CERVICAL FUSION     Dr Jeral Fruit   CERVICAL FUSION  12/31/2012   Dr Jeral Fruit   CERVICAL FUSION  04/2022   CHEST TUBE INSERTION     for traumatic Pneumothorax   COLONOSCOPY  07/16/2010   normal    COLONOSCOPY W/ POLYPECTOMY  1997   negative since; Dr Arlyce Dice   ESOPHAGOGASTRODUODENOSCOPY  07/16/2010   normal   eye lid raise     EYE SURGERY Bilateral    cataracts removed - /w IOL   HARDWARE REMOVAL  08/2021   JOINT REPLACEMENT     LUMBAR FUSION     Dr Jeral Fruit   NASAL SINUS SURGERY     POSTERIOR CERVICAL FUSION/FORAMINOTOMY N/A 12/31/2012   Procedure: POSTERIOR LATERAL CERVICAL FUSION/FORAMINOTOMY LEVEL 1 CERVICAL THREE-FOUR WITH LATERAL MASS SCREWS;  Surgeon: Karn Cassis, MD;  Location: [facility];  Service: Neurosurgery;  Laterality: N/A;   PTOSIS REPAIR Bilateral 10/02/2016   Procedure: INTERNAL PTOSIS REPAIR;  Surgeon: Floydene Flock, MD;  Location: [facility] OR;  Service: Plastics;  Laterality: Bilateral;   SKIN BIOPSY     THYROID SURGERY  R lobe removed- 1972, has grown back - CT last done- 2018   TOTAL HIP  ARTHROPLASTY  12/22/2011   Procedure: TOTAL HIP ARTHROPLASTY;  Surgeon: Nestor Lewandowsky, MD;  Location: [facility] OR;  Service: Orthopedics;  Laterality: Left;   TOTAL SHOULDER ARTHROPLASTY     TUBAL LIGATION     UPPER GASTROINTESTINAL ENDOSCOPY  2012   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   WRIST SURGERY Left 09/16/2022   wrist/arm   Social History   Social History Narrative   Not on file   Immunization History  Administered Date(s) Administered   Fluad Quad(high Dose 65+) 02/02/2020, 02/01/2022   Influenza Whole 02/22/2007, 02/08/2013   Influenza, High Dose Seasonal PF 02/04/2015, 02/18/2017, 02/21/2018, 01/16/2019, 01/25/2021, 02/02/2023   Influenza-Unspecified 01/24/2014, 01/25/2016, 02/18/2017, 01/16/2019, 01/25/2020   Moderna Covid-19 Fall Seasonal Vaccine 17yrs & older 02/02/2023   Moderna Covid-19 Vaccine Bivalent Booster 74yrs & up 02/11/2022   PFIZER(Purple Top)SARS-COV-2 Vaccination 06/18/2019, 07/09/2019, 01/09/2020, 07/03/2020, 02/07/2021   PNEUMOCOCCAL CONJUGATE-20 02/26/2022   Pfizer Covid-19 Vaccine Bivalent Booster 62yrs & up 02/08/2021   Pneumococcal Conjugate-13 11/23/2012, 09/08/2014   Pneumococcal Polysaccharide-23 05/26/2006   Pneumococcal-Unspecified 12/28/2012, 06/09/2020   Respiratory Syncytial Virus Vaccine,Recomb Aduvanted(Arexvy) 01/23/2022   Td 05/28/2010   Tdap 12/24/2020, 01/07/2021   Unspecified SARS-COV-2 Vaccination 06/26/2020, 02/11/2022   Varicella 11/07/2020   Zoster Recombinant(Shingrix) 02/10/2019, 04/14/2019   Zoster, Live 05/26/2009, 07/10/2020     Objective: Vital Signs: BP (!) 144/89 (BP Location: Left Arm, Patient Position: Sitting, Cuff Size: Normal)   Pulse 76   Resp 14   Ht 5' (1.524 m)   Wt 116 lb 6.4 oz (52.8 kg)   BMI 22.73 kg/m    Physical Exam Vitals and nursing note reviewed.  Constitutional:      Appearance: She is well-developed.  HENT:     Head: Normocephalic and atraumatic.  Eyes:     Conjunctiva/sclera: Conjunctivae  normal.  Cardiovascular:     Rate and Rhythm: Normal rate and regular rhythm.     Heart sounds: Normal heart sounds.  Pulmonary:     Effort: Pulmonary effort is normal.     Breath sounds: Normal breath sounds.  Abdominal:     General: Bowel sounds are normal.     Palpations: Abdomen is soft.  Musculoskeletal:     Cervical back: Normal range of motion.  Lymphadenopathy:     Cervical: No cervical adenopathy.  Skin:    General: Skin is warm and dry.     Capillary Refill: Capillary refill takes less than 2 seconds.  Neurological:     Mental Status: She is alert and oriented to person, place, and time.  Psychiatric:        Behavior: Behavior normal.      Musculoskeletal Exam: C-spine has limited range of motion with lateral rotation especially to the left.  Limited mobility of the lumbar spine.  Shoulder joints and elbow joints have good range of motion.  Limited range of motion of the right wrist.  Synovial thickening of the right wrist noted.  Synovial thickening of all MCP joints but no active synovitis noted.  Thickening of PIP joints with subluxation of the right fifth PIP joint.  Thickening of DIP joints with limited extension.   Knee joints have good ROM with no effusion.  Ankle joints have good ROM with no tenderness or joint swelling.   CDAI Exam: CDAI Score: -- Patient Global: --; Provider Global: -- Swollen: --; Tender: -- Joint Exam 06/15/2023  No joint exam has been documented for this visit   There is currently no information documented on the homunculus. Go to the Rheumatology activity and complete the homunculus joint exam.  Investigation: No additional findings.  Imaging: No results found.  Recent Labs: Lab Results  Component Value Date   WBC 3.9 05/14/2023   HGB 11.8 05/14/2023   PLT 255 05/14/2023   NA 138 05/14/2023   K 4.7 05/14/2023   CL 103 05/14/2023   CO2 29 05/14/2023   GLUCOSE 86 05/14/2023   BUN 10 05/14/2023   CREATININE 0.95 05/14/2023    BILITOT 0.4 05/14/2023   ALKPHOS 29 (L) 04/10/2020   AST 22 05/14/2023   ALT 16 05/14/2023   PROT 5.8 (L) 05/14/2023   ALBUMIN 4.2 04/10/2020   CALCIUM 8.9 05/14/2023   GFRAA 65 09/26/2020   QFTBGOLDPLUS NEGATIVE 11/03/2022    Speciality Comments: Prior therapy includes: Harriette Ohara (cost), Olumiant-inadequate response, Imuran weakness, leflunomide-intolerance Prolia: 05/04/18, 11/12/18, 06/09/19, 12/07/19, 07/05/20, 01/01/21, 07/09/21, 01/09/22, 07/24/22  Procedures:  No procedures performed Allergies: Darvon [propoxyphene hcl], Demerol [meperidine], Penicillins, Imuran [azathioprine], Sulfa antibiotics, Arava [leflunomide], Lipitor [atorvastatin], and Vytorin [ezetimibe-simvastatin]    Assessment / Plan:     Visit Diagnoses: Rheumatoid arthritis involving multiple sites with positive rheumatoid factor (HCC) -Patient presents today experiencing chronic pain involving multiple joints.  She has had an exacerbation of symptoms since discontinuing both Cymbalta and tramadol 2 to 3 months ago.  According to the patient there was concern of concurrent use of both Cymbalta and tramadol due to the risk for serotonin syndrome.  Patient states that her pain levels were significantly better controlled while taking Cymbalta.  Patient plans on resuming Cymbalta 30 mg 1 capsule daily and may consider increasing to 60 mg under the guidance of Dr. Katrinka Blazing.  She will hold off on restarting tramadol at this time.  She remains on [name] 15 mg 1 tablet by mouth daily, methotrexate 5 tablets by mouth once weekly, folic acid 1 mg daily, and prednisone 5 mg 1 tablet daily.  She has not had any recent interruptions in therapy and has not had any side effects with this combination of medications.  Overall her rheumatoid arthritis remains stable on the current treatment regimen.  No medication changes will be made at this time.  She was advised to notify us if she develops signs or symptoms of a flare.  She will follow-up in the  office in 5 months or sooner if needed.  Plan: HgB A1c, Lipid panel  High risk medication use - [name] 15 mg 1 tablet by mouth daily, Methotrexate 5 tablets weekly, folic acid 1 mg by mouth daily, prednisone 5 mg 1 tablet by mouth daily.  CBC and CMP updated on 05/14/23. Her next lab work will be due in March and every 3 months. TB Gold negative on 11/03/22.  Lipid panel updated on 01/22/23. Future order for TB gold placed today.  Hgb A1c future order placed today  No recurrent infections. Discussed the importance of holding rinvoq and methotrexate if she develops signs or symptoms of an infection and to resume once the infection has completely cleared.  - Plan: HgB A1c, Lipid panel  Long term (current) use of systemic steroids -Prednisone 5 mg 1 tablet by mouth daily.  Patient is aware of the risks of long term prednisone use.  She remains on prolia as prescribed.  Future orders for Hgb A1c and lipid panel placed today to be drawn with next lab draw.  Plan: HgB A1c, Lipid panel  Ganglion cyst of volar aspect of right wrist: Patient does not want to proceed with surgical intervention.  Pain in both hands: Chronic pain.  Increased nocturnal pain since discontinuing cymbalta and tramadol.  Discussed the use of arthritis compression gloves.  She plans on resuming cymbalta under the care of Dr. Katrinka Blazing.   Primary osteoarthritis of right hip: No groin pain currently.   History of total hip replacement, left - Dr. Turner Daniels- Doing well.    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 - She underwent an uncomplicated C7-T1 anterior cervical discectomy and fusion on 04/23/2022 performed by Dr. Jordan Likes. Chronic pain.  Limited ROM with lateral rotation, especially to the left.   Degeneration of intervertebral disc of lumbar region without discogenic back pain or lower extremity pain - Lumbar spine surgery in January 2023 x2 by Dr. Jordan Likes. Chronic pain.  Limited mobility.   Age-related osteoporosis without current  pathological fracture - DEXA 04/14/22: RFN T-score -3.2. Current treatment-prolia 60 mg sq injections every 6 months- last prolia injection: 02/05/2023.   Future order for vitamin D placed today.  Due to update DEXA November 2025.     - Plan: VITAMIN D 25 Hydroxy (Vit-D Deficiency, Fractures)  Closed fracture of left wrist with routine healing, subsequent encounter: Patient remains on Prolia 60 mg sq injections every 6 months.   Vitamin D deficiency - Future order for vitamin D placed today. Plan: VITAMIN D 25 Hydroxy (Vit-D Deficiency, Fractures)  Closed fracture of right wrist with routine healing, subsequent encounter - Fall 09/16/22--Under care of Dr. Yehuda Budd.  Other fatigue: Chronic and secondary  to insomnia.  Primary insomnia: She has been experiencing interrupted sleep at night due to severity of nocturnal pain.  Her nocturnal pain has worsened since discontinuing Cymbalta and tramadol. Her pain levels have been inadequately controlled so she plans on resuming cymbalta as previously prescribed by Dr. Katrinka Blazing.   Other medical conditions are listed as follows:  History of hypertension: Blood pressure was 144/89 today in the office.  Patient was advised to monitor blood pressure closely to reach out to PCP if her blood pressure remains elevated.  History of hyperlipidemia - Future order for lipid panel placed today to be drawn with next lab draw.  Plan: Lipid panel  History of multiple sclerosis (HCC)  History of depression  History of anxiety  History of cellulitis  Orders: Orders Placed This Encounter  Procedures   HgB A1c   Lipid panel   VITAMIN D 25 Hydroxy (Vit-D Deficiency, Fractures)   No orders of the defined types were placed in this encounter.  Follow-Up Instructions: Return in about 5 months (around 11/13/2023) for Rheumatoid arthritis, Osteoporosis.   Gearldine Bienenstock, PA-C  Note - This record has been created using Dragon software.  Chart creation errors have been  sought, but may not always  have been located. Such creation errors do not reflect on  the standard of medical care.

## 2023-06-03 ENCOUNTER — Ambulatory Visit: Payer: Self-pay | Admitting: Internal Medicine

## 2023-06-03 ENCOUNTER — Ambulatory Visit: Payer: Self-pay

## 2023-06-03 NOTE — Telephone Encounter (Addendum)
  Chief Complaint: weird feeling with heartbeat Symptoms: floppy fish feeling in chest, new shortness of breath with exertion Frequency: intermittent since yesterday Pertinent Negatives: Patient denies chest pain, dizziness, sweating, nausea Disposition: [] ED /[] Urgent Care (no appt availability in office) / [] Appointment(In office/virtual)/ []  [facility]/ [] Home Care/ [] Refused Recommended Disposition /[] [facility]/ []  Follow-up with PCP Additional Notes: Patient called in stating she has been on vacation for the last 9 days and forgot her blood pressure medication at home so she has not taken it. Patient states she has taken CBD for the past 9 days but unsure if this is related to onset of weird feeling in chest that first arrived on her drive home yesterday. Patient compared the feeling to a fish out of water . Patient states she noticed it again when chasing after her cat. Patient states the symptoms are worse with exertion, and that she notices she is more short of breath than normal. Patient states her BP has been higher than normal. Patient states she would not like to come in today if possible, would like to come in tomorrow. Phone failure and was unable to schedule patient appt. Patient call back attempted. LVM  Update: Patient called back and was able to make her an appointment for today at Urgent Care.    Copied from CRM [id number]. Topic: Clinical - Red Word Triage >> Jun 03, 2023 12:10 PM Brittany M wrote: Reason for CRM: Patient is noticing her heart rhythm is not normal. She explains its like a fish flopping around in her heart. Out of breath very fast. Reason for Disposition  [1] Palpitations AND [2] no improvement after using Care Advice  [1] Skipped or extra beat(s) AND [2] increases with exercise or exertion  Answer Assessment - Initial Assessment Questions 1. DESCRIPTION: Please describe your heart rate or heartbeat that you are having (e.g., fast/slow,  regular/irregular, skipped or extra beats, palpitations)     If I chase after cat, I notice I'm winded. Described as fish flopping in chest 2. ONSET: When did it start? (Minutes, hours or days)      Yesterday 3. DURATION: How long does it last (e.g., seconds, minutes, hours)     A little while 4. PATTERN Does it come and go, or has it been constant since it started?  Does it get worse with exertion?   Are you feeling it now?     Intermittent 6. HEART RATE: Can you tell me your heart rate? How many beats in 15 seconds?  (Note: not all patients can do this)       Higher than normal 7. RECURRENT SYMPTOM: Have you ever had this before? If Yes, ask: When was the last time? and What happened that time?      No 8. CAUSE: What do you think is causing the palpitations?     Missed 9 days of blood pressure medication 9. CARDIAC HISTORY: Do you have any history of heart disease? (e.g., heart attack, angina, bypass surgery, angioplasty, arrhythmia)      No 10. OTHER SYMPTOMS: Do you have any other symptoms? (e.g., dizziness, chest pain, sweating, difficulty breathing)       More tired than normal, more short of breath  Protocols used: Heart Rate and Heartbeat Questions-A-AH

## 2023-06-04 ENCOUNTER — Other Ambulatory Visit: Payer: Self-pay | Admitting: Family Medicine

## 2023-06-10 ENCOUNTER — Other Ambulatory Visit: Payer: Self-pay | Admitting: Internal Medicine

## 2023-06-11 DIAGNOSIS — D0471 Carcinoma in situ of skin of right lower limb, including hip: Secondary | ICD-10-CM | POA: Diagnosis not present

## 2023-06-11 DIAGNOSIS — L821 Other seborrheic keratosis: Secondary | ICD-10-CM | POA: Diagnosis not present

## 2023-06-11 DIAGNOSIS — D485 Neoplasm of uncertain behavior of skin: Secondary | ICD-10-CM | POA: Diagnosis not present

## 2023-06-11 DIAGNOSIS — L814 Other melanin hyperpigmentation: Secondary | ICD-10-CM | POA: Diagnosis not present

## 2023-06-11 DIAGNOSIS — D1801 Hemangioma of skin and subcutaneous tissue: Secondary | ICD-10-CM | POA: Diagnosis not present

## 2023-06-11 DIAGNOSIS — Z85828 Personal history of other malignant neoplasm of skin: Secondary | ICD-10-CM | POA: Diagnosis not present

## 2023-06-11 DIAGNOSIS — I872 Venous insufficiency (chronic) (peripheral): Secondary | ICD-10-CM | POA: Diagnosis not present

## 2023-06-11 DIAGNOSIS — L570 Actinic keratosis: Secondary | ICD-10-CM | POA: Diagnosis not present

## 2023-06-15 ENCOUNTER — Encounter: Payer: Self-pay | Admitting: Physician Assistant

## 2023-06-15 ENCOUNTER — Ambulatory Visit: Payer: Medicare Other | Attending: Physician Assistant | Admitting: Physician Assistant

## 2023-06-15 VITALS — BP 144/89 | HR 76 | Resp 14 | Ht 60.0 in | Wt 116.4 lb

## 2023-06-15 DIAGNOSIS — E559 Vitamin D deficiency, unspecified: Secondary | ICD-10-CM

## 2023-06-15 DIAGNOSIS — Z79899 Other long term (current) drug therapy: Secondary | ICD-10-CM | POA: Diagnosis not present

## 2023-06-15 DIAGNOSIS — M51369 Other intervertebral disc degeneration, lumbar region without mention of lumbar back pain or lower extremity pain: Secondary | ICD-10-CM

## 2023-06-15 DIAGNOSIS — Z8679 Personal history of other diseases of the circulatory system: Secondary | ICD-10-CM

## 2023-06-15 DIAGNOSIS — Z872 Personal history of diseases of the skin and subcutaneous tissue: Secondary | ICD-10-CM

## 2023-06-15 DIAGNOSIS — S62102D Fracture of unspecified carpal bone, left wrist, subsequent encounter for fracture with routine healing: Secondary | ICD-10-CM | POA: Diagnosis not present

## 2023-06-15 DIAGNOSIS — F5101 Primary insomnia: Secondary | ICD-10-CM

## 2023-06-15 DIAGNOSIS — Z96642 Presence of left artificial hip joint: Secondary | ICD-10-CM

## 2023-06-15 DIAGNOSIS — M810 Age-related osteoporosis without current pathological fracture: Secondary | ICD-10-CM | POA: Diagnosis not present

## 2023-06-15 DIAGNOSIS — Z8639 Personal history of other endocrine, nutritional and metabolic disease: Secondary | ICD-10-CM

## 2023-06-15 DIAGNOSIS — M79641 Pain in right hand: Secondary | ICD-10-CM

## 2023-06-15 DIAGNOSIS — M4312 Spondylolisthesis, cervical region: Secondary | ICD-10-CM

## 2023-06-15 DIAGNOSIS — R5383 Other fatigue: Secondary | ICD-10-CM

## 2023-06-15 DIAGNOSIS — M0579 Rheumatoid arthritis with rheumatoid factor of multiple sites without organ or systems involvement: Secondary | ICD-10-CM | POA: Diagnosis not present

## 2023-06-15 DIAGNOSIS — S62101D Fracture of unspecified carpal bone, right wrist, subsequent encounter for fracture with routine healing: Secondary | ICD-10-CM

## 2023-06-15 DIAGNOSIS — Z7952 Long term (current) use of systemic steroids: Secondary | ICD-10-CM | POA: Diagnosis not present

## 2023-06-15 DIAGNOSIS — M79642 Pain in left hand: Secondary | ICD-10-CM

## 2023-06-15 DIAGNOSIS — M1611 Unilateral primary osteoarthritis, right hip: Secondary | ICD-10-CM

## 2023-06-15 DIAGNOSIS — G35 Multiple sclerosis: Secondary | ICD-10-CM

## 2023-06-15 DIAGNOSIS — M67431 Ganglion, right wrist: Secondary | ICD-10-CM | POA: Diagnosis not present

## 2023-06-15 DIAGNOSIS — Z8659 Personal history of other mental and behavioral disorders: Secondary | ICD-10-CM

## 2023-06-15 NOTE — Progress Notes (Signed)
Medication Samples have been provided to the patient.  Drug name: rinvoq       Strength: 15mg         Qty: 2  LOT: [id number]  Exp.Date: 02/15/2024  Dosing instructions: Take 15mg  by mouth daily.

## 2023-06-15 NOTE — Patient Instructions (Signed)
Standing Labs We placed an order today for your standing lab work.   Please have your standing labs drawn in March and every 3 months   Please have your labs drawn 2 weeks prior to your appointment so that the provider can discuss your lab results at your appointment, if possible.  Please note that you may see your imaging and lab results in [place] before we have reviewed them. We will contact you once all results are reviewed. Please allow our office up to 72 hours to thoroughly review all of the results before contacting the office for clarification of your results.  WALK-IN LAB HOURS  Monday through Thursday from 8:00 am -12:30 pm and 1:00 pm-5:00 pm and Friday from 8:00 am-12:00 pm.  Patients with office visits requiring labs will be seen before walk-in labs.  You may encounter longer than normal wait times. Please allow additional time. Wait times may be shorter on  Monday and Thursday afternoons.  We do not book appointments for walk-in labs. We appreciate your patience and understanding with our staff.   Labs are drawn by [facility]. Please bring your co-pay at the time of your lab draw.  You may receive a bill from [facility] for your lab work.  Please note if you are on Hydroxychloroquine and and an order has been placed for a Hydroxychloroquine level,  you will need to have it drawn 4 hours or more after your last dose.  If you wish to have your labs drawn at another location, please call the office 24 hours in advance so we can fax the orders.  The office is located at [address] [place], [place], [place] [address]   If you have any questions regarding directions or hours of operation,  please call [phone].   As a reminder, please drink plenty of water prior to coming for your lab work. Thanks!

## 2023-06-17 DIAGNOSIS — N3010 Interstitial cystitis (chronic) without hematuria: Secondary | ICD-10-CM | POA: Diagnosis not present

## 2023-06-17 DIAGNOSIS — R311 Benign essential microscopic hematuria: Secondary | ICD-10-CM | POA: Diagnosis not present

## 2023-06-30 ENCOUNTER — Ambulatory Visit (INDEPENDENT_AMBULATORY_CARE_PROVIDER_SITE_OTHER): Payer: Medicare Other | Admitting: Audiology

## 2023-06-30 DIAGNOSIS — H903 Sensorineural hearing loss, bilateral: Secondary | ICD-10-CM

## 2023-06-30 NOTE — Progress Notes (Signed)
  [address] [place], [place] [address] [phone]  Hearing Aid Check     Orlanda  A Ehler comes for a scheduled appointment for a hearing aid check. Accompanied ab:[id number]   Right Left  Hearing aid manufacturer Phonak Audeo P50-R SN: [id number] Phonak Audeo P50-R SN: [id number]  Hearing aid style Receiver-in-the-canal Receiver-in-the-canal  Hearing aid technical brewer  Receiver M M  Dome/ custom earpiece Small double dome Small vented dome  Retention wire no no  Warranty expiration date 07-15-2022 07-15-2022  Initial fitting date 04-23-2020 04-23-2020  [facility]     Chief complaint: Patient reports she cannot hear well from her .  Actions taken: Inspection of the device and listening check showed that they were not amplifying Both wax filters were replaced with new ones and the hearing aids started to amplify again. Patient said it did sound WNL's.  Services fee: $30 was paid at checkout for the services rendered today.  Patient was oriented about having her wax removed by the ENT, if the hearing aids stopped working again within the next few weeks.  Recommend: Return for a hearing aid check , as needed. Return for a hearing evaluation and to see an ENT, if concerns with hearing changes arise.    Santiaga Butzin MARIE LEROUX-MARTINEZ, AUD

## 2023-07-03 ENCOUNTER — Ambulatory Visit (INDEPENDENT_AMBULATORY_CARE_PROVIDER_SITE_OTHER): Payer: Medicare Other | Admitting: Audiology

## 2023-07-10 ENCOUNTER — Telehealth: Payer: Self-pay | Admitting: *Deleted

## 2023-07-10 NOTE — Telephone Encounter (Signed)
Patient contacted the office and requested a sample of [name]. Patient is up to date on labs. Patient states she will come by on Monday to pick it up. Sample reserved in cabinet for patient.

## 2023-07-13 NOTE — Telephone Encounter (Signed)
Medication Samples have been provided to the patient.  Drug name: [name]       Strength: 15 mg        Qty: 2  LOT: [id number]  Exp.Date: 08/01/2024  Dosing instructions: Take one tablet by mouth daily.

## 2023-07-14 ENCOUNTER — Other Ambulatory Visit: Payer: Self-pay

## 2023-07-14 ENCOUNTER — Ambulatory Visit: Payer: Self-pay | Admitting: Internal Medicine

## 2023-07-14 MED ORDER — OMEPRAZOLE 40 MG PO CPDR: DELAYED_RELEASE_CAPSULE | ORAL | 1 refills | Status: DC

## 2023-07-14 NOTE — Telephone Encounter (Signed)
Copied from CRM [id number]. Topic: Clinical - Medication Refill >> Jul 14, 2023  2:05 PM Aletta Edouard wrote: Most Recent Primary Care Visit:  Provider: BURNS, Bobette Mo  Department: [facility]  Visit Type: OFFICE VISIT  Date: 04/03/2023  Medication: medication for heart burn  Has the patient contacted their pharmacy? No (Agent: If no, request that the patient contact the pharmacy for the refill. If patient does not wish to contact the pharmacy document the reason why and proceed with request.) (Agent: If yes, when and what did the pharmacy advise?)  Is this the correct pharmacy for this prescription? Yes If no, delete pharmacy and type the correct one.  This is the patient's preferred pharmacy:  [id number] [place], [place] - [address] [place] [address] Phone: [phone] Fax: [phone]   Has the prescription been filled recently? No  Is the patient out of the medication? Yes  Has the patient been seen for an appointment in the last year OR does the patient have an upcoming appointment? Yes  Can we respond through MyChart? No  Agent: Please be advised that Rx refills may take up to 3 business days. We ask that you follow-up with your pharmacy.  Chief Complaint: Heartburn/acid reflux Symptoms: Burning in throat Frequency: Chronic Pertinent Negatives: Patient denies vomiting Disposition: [] ED /[] Urgent Care (no appt availability in office) / [x] Appointment(In office/virtual)/ []  [facility]/ [] Home Care/ [] Refused Recommended Disposition /[] [facility]/ []  Follow-up with PCP Additional Notes: Patient called in requesting a medication for her heartburn/acid reflux. Patient stated that she has chronic heartburn/acid reflux and used to take omeprazole. Patient stated she has not been prescribed omeprazole in a long time. Patient stated her heartburn/acid reflux has become more bothersome over the last month. Patient  stated she often feels a burning sensation in her throat that worsens at night when she is laying down. Patient stated spicy foods exacerbate the issue. Patient denied nausea and vomiting.This RN advised patient to see her PCP within 2 weeks. Patient scheduled in office with PCP. This RN advised patient to call back if symptoms worsen. Patient complied.   Reason for Disposition  [1] Patient says chest pain feels exactly the same as previously diagnosed "heartburn" AND [2] describes burning in chest AND [3] accompanying sour taste in mouth  Answer Assessment - Initial Assessment Questions 1. LOCATION: "Where does it hurt?"       Heartburn and burning in throat 2. RADIATION: "Does the pain go anywhere else?" (e.g., into neck, jaw, arms, back)     Denies 3. ONSET: "When did the chest pain begin?" (Minutes, hours or days)      States she has been dealing with heartburn/acid reflux her whole life, but has not been prescribed omeprazole in a long time, states heartburn/acid reflux has become bothersome again within the last month 4. PATTERN: "Does the pain come and go, or has it been constant since it started?"  "Does it get worse with exertion?"      Worse at night 7. CARDIAC RISK FACTORS: "Do you have any history of heart problems or risk factors for heart disease?" (e.g., angina, prior heart attack; diabetes, high blood pressure, high cholesterol, smoker, or strong family history of heart disease)     Denies 9. CAUSE: "What do you think is causing the chest pain?"     Heartburn/acid reflux, spicy foods  10. OTHER SYMPTOMS: "Do you have any other symptoms?" (e.g., dizziness, nausea, vomiting, sweating,  fever, difficulty breathing, cough)     Denies vomiting and nausea  Protocols used: Chest Pain-A-AH

## 2023-07-14 NOTE — Telephone Encounter (Signed)
 Sent in today

## 2023-07-23 ENCOUNTER — Telehealth (INDEPENDENT_AMBULATORY_CARE_PROVIDER_SITE_OTHER): Payer: Self-pay | Admitting: Audiology

## 2023-07-23 NOTE — Telephone Encounter (Signed)
 Confirmed appt & location [id number] afm

## 2023-07-24 ENCOUNTER — Ambulatory Visit (INDEPENDENT_AMBULATORY_CARE_PROVIDER_SITE_OTHER): Payer: Medicare Other | Admitting: Physician Assistant

## 2023-07-24 ENCOUNTER — Ambulatory Visit (INDEPENDENT_AMBULATORY_CARE_PROVIDER_SITE_OTHER): Payer: Self-pay | Admitting: Audiology

## 2023-07-24 DIAGNOSIS — H6123 Impacted cerumen, bilateral: Secondary | ICD-10-CM

## 2023-07-24 DIAGNOSIS — E041 Nontoxic single thyroid nodule: Secondary | ICD-10-CM | POA: Diagnosis not present

## 2023-07-24 DIAGNOSIS — H903 Sensorineural hearing loss, bilateral: Secondary | ICD-10-CM

## 2023-07-24 NOTE — Progress Notes (Unsigned)
 Dear Dr. Lawerance Bach, Here is my assessment for our mutual patient, Sherri [name]. Thank you for allowing me the opportunity to care for your patient. Please do not hesitate to contact me should you have any other questions. Sincerely, Burna Forts PA-C  Otolaryngology Clinic Note Referring provider: Dr. Lawerance Bach HPI:  Sherri [name] is a 83 y.o. female kindly referred by Dr. Lawerance Bach   The patient is a 83 year old female seen today for evaluation of cerumen impaction.  The patient notes that she has had issues with cerumen previously and had them cleaned out.  She was originally scheduled with audiology today for hearing aid check as she has had a high-pitched sound in her left hearing aid over the last year and has been unable to use it.  I was asked to evaluate for cerumen and potential removal.  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  She notes she developed some residual nodule tissue had grown back into an identifiable right thyroid gland.  She had a TI-RADS category 5 lesion which was biopsied on 09/17/2017 but only scant epithelial material was evident on the specimen.  She had repeat biopsy on 10/29/2017 which came back as benign follicular nodule Bethesda 2.  Since that time she has had no further evaluation or management as none was indicated.  She was last seen by Dr. Suszanne Conners on 07/27/2023, at that time she was symptomatic.  She continues to remain symptomatic today with no issues or concerns related to her thyroid.    Independent Review of Additional Tests or Records:  FNA results from 10/29/2017- Benign follicular nodule, bethesda II   PMH/Meds/All/SocHx/FamHx/ROS:   Past Medical History:  Diagnosis Date   Anxiety    takes Valium daily as needed   Basal cell carcinoma 01/21/1988   left nostril (MOHS), sup-right calf (CX35FU)   Basal cell carcinoma 03/22/1996   right calf (CX35FU)   Basal cell carcinoma 03/31/1995   left wing nose   Bruises  easily    d/t meds   Cancer (HCC)    basal cell ca, in situ- uterine    Cataracts, bilateral    removed bilateral   Chronic back pain    Dizziness    r/t to meds   GERD (gastroesophageal reflux disease)    no meds on a regular basis but will take Tums if needed   Headache(784.0)    r/t neck issues   History of bronchitis 6-69yrs ago   History of colon polyps    benign   HOH (hard of hearing)    wears hearing aids   Hyperlipidemia    takes Atorvastatin on Mondays and Fridays   Hypertension    Joint pain    Joint swelling    Multiple sclerosis (HCC)    Neuromuscular disorder (HCC)    Dr. [place] [facility], follows [facility]   Nocturia    PONV (postoperative nausea and vomiting)    trouble urinating after surgery in 2014   Postoperative nausea and vomiting 01/11/2019   Rheumatoid arthritis (HCC)    Dr Glo Herring weekly, RA- hands- knees- feet    Rheumatoid arthritis(714.0)    Dr Corliss Skains takes Harriette Ohara daily   Right wrist fracture    Skin cancer    Squamous cell carcinoma of skin 11/02/2001   in situ-right knee (cx68fu)   Squamous cell carcinoma of skin 11/12/2011   in situ-left forearm (CX35FU), in situ-left foot (CX35FU)   Squamous cell carcinoma of  skin 01/22/2012   in situ-left lower forearm (txpbx)   Squamous cell carcinoma of skin 11/17/2012   right shin (txpbx)   Squamous cell carcinoma of skin 10/28/2013   in situ-right shoulder (CX35FU)   Squamous cell carcinoma of skin 04/28/2014   well diff-right shin (txpbx)   Squamous cell carcinoma of skin 11/02/2014   in situ-Left shin (txpbx)   Squamous cell carcinoma of skin 12/15/2014   in situ-Left hand (txpbx), bowens-left side chest (txpbx)   Squamous cell carcinoma of skin 05/09/2015   in situ-left hand (txpbx)    Squamous cell carcinoma of skin 05/07/2016   in situ-left inner shin,ant, in situ-left inner shin, post, in situ-left outer forearm, in situ-right knuckle   Squamous cell carcinoma of  skin 03/1302018   in situ-left shoulder (txpbx), in situ-right inner shin (txpbx), in situ-top of left foot (txpbx), KA- left forearm (txpbx)   Squamous cell carcinoma of skin 12/02/2016   in situ-left inner shin (txpbx)   Squamous cell carcinoma of skin 03/04/2017   in situ-left outer sup, shin (txpbx), in situ- right 2nd knuckle finger (txpbx), in situ- Left wrist (txpbx)   Squamous cell carcinoma of skin 04/06/2018   in situ-above left knee inner (txpbx)   Squamous cell carcinoma of skin 04/21/2018   in situ-right top hand (txpbx)   Squamous cell carcinoma of skin 07/08/2018   in situ-right lower inner shin (txpbx)   Squamous cell carcinoma of skin 04/27/2019   in situ- Left neck(CX35FU), In situ- right neck (CX35FU)   Urinary retention    sees Dr.Wrenn about 2 times a yr     Past Surgical History:  Procedure Laterality Date   ABDOMINAL HYSTERECTOMY     1985   ANTERIOR CERVICAL DECOMP/DISCECTOMY FUSION N/A 04/23/2022   Procedure: Anterior Cervical Decompression/Discectomy Fusion - Cervical Seven-Thoracic One;  Surgeon: Julio Sicks, MD;  Location: [facility] OR;  Service: Neurosurgery;  Laterality: N/A;  3C   APPENDECTOMY     with TAH   BACK SURGERY     several   BREAST BIOPSY Right    Results were negative   BROW LIFT Bilateral 10/02/2016   Procedure: BILATERAL LOWER LID BLEPHAROPLASTY;  Surgeon: Floydene Flock, MD;  Location: [facility] OR;  Service: Plastics;  Laterality: Bilateral;   CARPAL TUNNEL RELEASE Right    cataracts     CERVICAL FUSION     Dr Jeral Fruit   CERVICAL FUSION  12/31/2012   Dr Jeral Fruit   CERVICAL FUSION  04/2022   CHEST TUBE INSERTION     for traumatic Pneumothorax   COLONOSCOPY  07/16/2010   normal    COLONOSCOPY W/ POLYPECTOMY  1997   negative since; Dr Arlyce Dice   ESOPHAGOGASTRODUODENOSCOPY  07/16/2010   normal   eye lid raise     EYE SURGERY Bilateral    cataracts removed - /w IOL   HARDWARE REMOVAL  08/2021   JOINT REPLACEMENT     LUMBAR FUSION     Dr  Jeral Fruit   NASAL SINUS SURGERY     POSTERIOR CERVICAL FUSION/FORAMINOTOMY N/A 12/31/2012   Procedure: POSTERIOR LATERAL CERVICAL FUSION/FORAMINOTOMY LEVEL 1 CERVICAL THREE-FOUR WITH LATERAL MASS SCREWS;  Surgeon: Karn Cassis, MD;  Location: [facility];  Service: Neurosurgery;  Laterality: N/A;   PTOSIS REPAIR Bilateral 10/02/2016   Procedure: INTERNAL PTOSIS REPAIR;  Surgeon: Floydene Flock, MD;  Location: [facility] OR;  Service: Plastics;  Laterality: Bilateral;   SKIN BIOPSY     THYROID SURGERY  R lobe removed- 1972, has grown back - CT last done- 2018   TOTAL HIP ARTHROPLASTY  12/22/2011   Procedure: TOTAL HIP ARTHROPLASTY;  Surgeon: Nestor Lewandowsky, MD;  Location: [facility] OR;  Service: Orthopedics;  Laterality: Left;   TOTAL SHOULDER ARTHROPLASTY     TUBAL LIGATION     UPPER GASTROINTESTINAL ENDOSCOPY  2012   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   WRIST SURGERY Left 09/16/2022   wrist/arm    Family History  Problem Relation Age of Onset   Cancer Mother        pancreatic   Diabetes Mother    Pancreatic cancer Mother    Heart disease Father        Rheumatic   Cancer Father        ? stomach   Stomach cancer Father    Cancer Sister        stomach   Stomach cancer Sister    Cancer Maternal Aunt        X 4; ? primary   Heart disease Paternal Aunt    Cancer Maternal Grandmother        cervical   Colon cancer Other        Aunts   Multiple sclerosis Daughter    Hypertension Neg Hx    Stroke Neg Hx    Esophageal cancer Neg Hx    Rectal cancer Neg Hx      Social Connections: Moderately Isolated (02/21/2022)   Social Connection and Isolation Panel [NHANES]    Frequency of Communication with Friends and Family: More than three times a week    Frequency of Social Gatherings with Friends and Family: Twice a week    Attends Religious Services: Never    Database administrator or Organizations: No    Attends Engineer, structural: Never    Marital Status: Married       Current Outpatient Medications:    atorvastatin (LIPITOR) 10 MG tablet, TAKE 1 TAB ON MONDAYS AND FRIDAY, Disp: 26 tablet, Rfl: 4   Biotin 5000 MCG TABS, Take 10,000 mcg by mouth every evening., Disp: , Rfl:    Carboxymethylcellul-Glycerin (LUBRICATING EYE DROPS OP), Place 1 drop into both eyes daily as needed (dry eyes)., Disp: , Rfl:    carvedilol (COREG) 3.125 MG tablet, TAKE 1 TABLET BY MOUTH TWICE A DAY WITH FOOD, Disp: 180 tablet, Rfl: 1   cholecalciferol (VITAMIN D) 25 MCG (1000 UNIT) tablet, Take 1,000 Units by mouth in the morning., Disp: , Rfl:    cyclobenzaprine (FLEXERIL) 10 MG tablet, TAKE 1 TABLET BY MOUTH THREE TIMES A DAY AS NEEDED FOR MUSCLE SPASM, Disp: 90 tablet, Rfl: 2   denosumab (PROLIA) 60 MG/ML SOSY injection, Inject 60 mg into the skin every 6 (six) months. Deliver to rheum: [address], [address], [place], [place] [address]. Appt on 07/23/2022, Disp: 1 mL, Rfl: 0   diazepam (VALIUM) 5 MG tablet, TAKE 1 TABLET BY MOUTH AT BEDTIME AS NEEDED FOR MUSCLE SPASMS., Disp: 30 tablet, Rfl: 5   diclofenac sodium (VOLTAREN) 1 % GEL, APPLY 3 GRAMS TO 3 LARGE JOINTS 3 TIMES A DAY AS NEEDED, Disp: 100 g, Rfl: 0   DULoxetine (CYMBALTA) 30 MG capsule, TAKE 1 CAPSULE BY MOUTH EVERY DAY (Patient not taking: Reported on 06/15/2023), Disp: 90 capsule, Rfl: 1   famotidine (PEPCID) 10 MG tablet, Take 10 mg by mouth as needed., Disp: , Rfl:    folic acid (FOLVITE) 1 MG tablet, TAKE  1 TABLET BY MOUTH EVERY DAY, Disp: 90 tablet, Rfl: 3   furosemide (LASIX) 20 MG tablet, TAKE 1 TABLET BY MOUTH EVERY DAY, Disp: 90 tablet, Rfl: 1   methotrexate (RHEUMATREX) 2.5 MG tablet, Take 5 tablets (12.5 mg total) by mouth once a week. Caution:Chemotherapy. Protect from light., Disp: , Rfl:    naloxegol oxalate (MOVANTIK) 12.5 MG TABS tablet, Take 1 tablet (12.5 mg total) by mouth daily. (Patient not taking: Reported on 06/15/2023), Disp: 30 tablet, Rfl: 2   omeprazole (PRILOSEC) 40 MG capsule, Take 1 tablet (40  mg total) once daily., Disp: 90 capsule, Rfl: 1   predniSONE (DELTASONE) 5 MG tablet, TAKE 1 TABLET BY MOUTH EVERY DAY IN THE MORNING, Disp: 90 tablet, Rfl: 0   traMADol (ULTRAM) 50 MG tablet, TAKE 1 TABLET (50 MG TOTAL) BY MOUTH 3 (THREE) TIMES DAILY AS NEEDED FOR SEVERE PAIN. (Patient not taking: Reported on 06/15/2023), Disp: 90 tablet, Rfl: 0   Upadacitinib ER ([name]) 15 MG TB24, Take 15 mg by mouth daily., Disp: , Rfl:    Physical Exam:   There were no vitals taken for this visit.  Pertinent Findings  CN II-XII intact  Bilateral EAC with impacted cerumen Anterior rhinoscopy: Septum midline; bilateral inferior turbinates with no hypertrophy No lesions of oral cavity/oropharynx; dentition wnl No obviously palpable neck masses/lymphadenopathy/thyromegaly ( no nodule noted) No respiratory distress or stridor  Seprately Identifiable Procedures:  Procedure: Bilateral ear microscopy and cerumen removal using microscope (CPT [phone]) - Mod 50 Pre-procedure diagnosis: bilateral cerumen impaction external auditory canals Post-procedure diagnosis: same Indication: bilateral cerumen impaction; given patient's otologic complaints and history as well as for improved and comprehensive examination of external ear and tympanic membrane, bilateral otologic examination using microscope was performed and impacted cerumen removed  Procedure: Patient was placed semi-recumbent. Both ear canals were examined using the microscope with findings above. Cerumen removed from bilateral external auditory canals using suction and currette with improvement in EAC examination and patency. Left: EAC was patent. TM was intact . Middle ear was aerated. Drainage: none Right: EAC was patent. TM was intact . Middle ear was aerated . Drainage: none Patient tolerated the procedure well.   Impression & Plans:  Sherri [name] is a 83 y.o. female with the following   Cerumen impaction-  Bilateral cerumen impaction was  removed today without issues or complications, the patient may return on a as needed basis for repeat evaluation as needed continue follow-up with audiology  Thyroid nodule-  The patient has a history of hemithyroidectomy, she had thyroid nodules biopsied in 2019 that showed benign follicular nodule with Bethesda 2.  She has had no significant changes since that time, she is asymptomatic.  Continue to monitor no further evaluation or management warranted today.  She will reach out to the office if she develops any changes including pain, difficulty swallowing, changes in size or any characteristics.    - f/u PRN   Thank you for allowing me the opportunity to care for your patient. Please do not hesitate to contact me should you have any other questions.  Sincerely, Burna Forts PA-C [facility] Phone: [phone] Fax: [phone]  07/24/2023, 1:50 PM

## 2023-07-24 NOTE — Progress Notes (Signed)
  [address] [place], [place] [address] [phone]  Hearing Aid Check     Sherri [name] comes for a scheduled appointment for a hearing aid check.       Right Left  Hearing aid manufacturer Deno Etienne P50-R SN: [id number] Phonak Audeo P50-R SN: [id number]  Hearing aid style Receiver-in-the-canal Receiver-in-the-[place]al  Hearing aid Technical brewer  Receiver M M  Dome/ custom earpiece Small double dome Small vented dome  Retention wire no no  Warranty expiration date 07-15-2022 07-15-2022  Initial fitting date 04-23-2020 04-23-2020  [facility]: [facility]      Chief complaint: Patient reports the left hearing aid is feeding back .  Actions taken: Patient really needs to see ENT for an ear cleaning. If the feedback is resolved after the ear is clear she does not need to have any changed made to her hearing aid..   Services fee: $0 was charged given that patient's problems was solved by having he r ear cleared from wax.    Recommend: Return for a hearing aid check , as needed. Return for a hearing evaluation and to see an ENT, if concerns with hearing changes arise.    Sherri [name], AUD

## 2023-07-26 ENCOUNTER — Encounter: Payer: Self-pay | Admitting: Internal Medicine

## 2023-07-26 NOTE — Progress Notes (Unsigned)
 Subjective:    Patient ID: Sherri [name], female    DOB: 18-Sep-1940, 83 y.o.   MRN: [id number]      HPI [place] is here for No chief complaint on file.   GERD -      Medications and allergies reviewed with patient and updated if appropriate.  Current Outpatient Medications on File Prior to Visit  Medication Sig Dispense Refill   atorvastatin (LIPITOR) 10 MG tablet TAKE 1 TAB ON MONDAYS AND FRIDAY 26 tablet 4   Biotin 5000 MCG TABS Take 10,000 mcg by mouth every evening.     Carboxymethylcellul-Glycerin (LUBRICATING EYE DROPS OP) Place 1 drop into both eyes daily as needed (dry eyes).     carvedilol (COREG) 3.125 MG tablet TAKE 1 TABLET BY MOUTH TWICE A DAY WITH FOOD 180 tablet 1   cholecalciferol (VITAMIN D) 25 MCG (1000 UNIT) tablet Take 1,000 Units by mouth in the morning.     cyclobenzaprine (FLEXERIL) 10 MG tablet TAKE 1 TABLET BY MOUTH THREE TIMES A DAY AS NEEDED FOR MUSCLE SPASM 90 tablet 2   denosumab (PROLIA) 60 MG/ML SOSY injection Inject 60 mg into the skin every 6 (six) months. Deliver to rheum: [address], [place], [place] [address]. Appt on 07/23/2022 1 mL 0   diazepam (VALIUM) 5 MG tablet TAKE 1 TABLET BY MOUTH AT BEDTIME AS NEEDED FOR MUSCLE SPASMS. 30 tablet 5   diclofenac sodium (VOLTAREN) 1 % GEL APPLY 3 GRAMS TO 3 LARGE JOINTS 3 TIMES A DAY AS NEEDED 100 g 0   DULoxetine (CYMBALTA) 30 MG capsule TAKE 1 CAPSULE BY MOUTH EVERY DAY (Patient not taking: Reported on 06/15/2023) 90 capsule 1   famotidine (PEPCID) 10 MG tablet Take 10 mg by mouth as needed.     folic acid (FOLVITE) 1 MG tablet TAKE 1 TABLET BY MOUTH EVERY DAY 90 tablet 3   furosemide (LASIX) 20 MG tablet TAKE 1 TABLET BY MOUTH EVERY DAY 90 tablet 1   methotrexate (RHEUMATREX) 2.5 MG tablet Take 5 tablets (12.5 mg total) by mouth once a week. Caution:Chemotherapy. Protect from light.     naloxegol oxalate (MOVANTIK) 12.5 MG TABS tablet Take 1 tablet (12.5 mg total) by mouth daily.  (Patient not taking: Reported on 06/15/2023) 30 tablet 2   omeprazole (PRILOSEC) 40 MG capsule Take 1 tablet (40 mg total) once daily. 90 capsule 1   predniSONE (DELTASONE) 5 MG tablet TAKE 1 TABLET BY MOUTH EVERY DAY IN THE MORNING 90 tablet 0   traMADol (ULTRAM) 50 MG tablet TAKE 1 TABLET (50 MG TOTAL) BY MOUTH 3 (THREE) TIMES DAILY AS NEEDED FOR SEVERE PAIN. (Patient not taking: Reported on 06/15/2023) 90 tablet 0   Upadacitinib ER ([name]) 15 MG TB24 Take 15 mg by mouth daily.     No current facility-administered medications on file prior to visit.    Review of Systems     Objective:  There were no vitals filed for this visit. BP Readings from Last 3 Encounters:  06/15/23 (!) 144/89  04/03/23 136/80  02/18/23 (!) 154/73   Wt Readings from Last 3 Encounters:  06/15/23 116 lb 6.4 oz (52.8 kg)  04/03/23 115 lb (52.2 kg)  02/18/23 117 lb 9.6 oz (53.3 kg)   There is no height or weight on file to calculate BMI.    Physical Exam         Assessment & Plan:    See Problem List for Assessment and Plan of chronic  medical problems.

## 2023-07-27 ENCOUNTER — Ambulatory Visit (INDEPENDENT_AMBULATORY_CARE_PROVIDER_SITE_OTHER): Payer: Medicare Other | Admitting: Internal Medicine

## 2023-07-27 VITALS — BP 142/80 | HR 71 | Temp 98.2°F | Ht 60.0 in | Wt 117.0 lb

## 2023-07-27 DIAGNOSIS — I10 Essential (primary) hypertension: Secondary | ICD-10-CM | POA: Diagnosis not present

## 2023-07-27 DIAGNOSIS — K219 Gastro-esophageal reflux disease without esophagitis: Secondary | ICD-10-CM

## 2023-07-27 DIAGNOSIS — K5909 Other constipation: Secondary | ICD-10-CM

## 2023-07-27 MED ORDER — PANTOPRAZOLE SODIUM 40 MG PO TBEC: 40.0000 mg | DELAYED_RELEASE_TABLET | Freq: Two times a day (BID) | ORAL | 5 refills | Status: DC

## 2023-07-27 MED ORDER — CARVEDILOL 6.25 MG PO TABS: 6.2500 mg | ORAL_TABLET | Freq: Two times a day (BID) | ORAL | 1 refills | Status: DC

## 2023-07-27 NOTE — Patient Instructions (Addendum)
        Medications changes include :   increase coreg to 6.25 mg twice a day.     stop metamucil  - start benefiber powder 2 tsp twice a day.   Start senokot daily Take miralax once -twice daily prn  Stop omeprazole.   Start pantoprazole 40 mg twice daily 30 minutes prior to meals.

## 2023-07-27 NOTE — Assessment & Plan Note (Signed)
 Chronic Has been dealing with chronic constipation for years Amitiza is not effective, Movantik not effective Linzess, Trulance not covered by insurance Has stopped tramadol and that has not made a difference Currently using a suppository every other day and having a small amount of loose stool Taking a laxative about once a week to have a more complete bowel movement and it is loose stools and she goes all day She denies any abdominal pain, but does have abdominal bloating and uncomfortableness, nausea, GERD Stop Metamucil Gummies Start Benefiber 2 teaspoons twice daily Start MiraLAX 1-2 times daily as needed-adjust as needed Start Senokot daily Continue lots of fruits and vegetables Increase water intake Deferred referral to gastroenterology unless symptoms are not improving

## 2023-07-27 NOTE — Assessment & Plan Note (Signed)
 Chronic Not controlled Currently taking omeprazole 40 mg daily-stop that Start pantoprazole 40 mg twice daily

## 2023-07-27 NOTE — Assessment & Plan Note (Signed)
 Chronic Not ideally controlled-has been elevated persistently Increase carvedilol to 6.25 mg mg twice daily

## 2023-07-28 ENCOUNTER — Encounter (INDEPENDENT_AMBULATORY_CARE_PROVIDER_SITE_OTHER): Payer: Self-pay | Admitting: Physician Assistant

## 2023-07-29 ENCOUNTER — Ambulatory Visit: Payer: Self-pay | Admitting: Internal Medicine

## 2023-07-29 NOTE — Addendum Note (Signed)
 Addended by: Pincus Sanes on: 07/29/2023 04:47 PM   Modules accepted: Orders

## 2023-07-29 NOTE — Telephone Encounter (Signed)
 The alternative would be Vicodin or hydrocodone-acetaminophen.  She can take that with the Cymbalta.  I could give her a few day supply, but she needs to be evaluated by me or someone else

## 2023-07-29 NOTE — Telephone Encounter (Signed)
  Chief Complaint: right leg pain Symptoms: buttocks and right leg pain radiating down the front of right leg (patient states it feels like her sciatic pain) Frequency: since yesterday afternoon Pertinent Negatives: Patient denies recent exercise or injury to right leg, chest pain, SOB, new numbness or weakness Disposition: [] ED /[] Urgent Care (no appt availability in office) / [x] Appointment(In office/virtual)/ []  [facility]/ [] Home Care/ [x] Refused Recommended Disposition /[] [facility]/ []  Follow-up with PCP Additional Notes: Patient states she has history of multiple back surgeries She states it is normally in the left leg but is in the right leg today. Patient states she had been on tramadol for 30 years, and was taken off of it about 6 months ago. She states she now takes Cymbalta and cyclobenzaprine, but was being told she can not take both of those. Patient states she was just seen on 07/27/23 and would like to know what her PCP recommends she take for the pain. She states the tramadol had helped previously.  Copied from CRM [id number]. Topic: Clinical - Red Word Triage >> Jul 29, 2023  3:24 PM Fredrich Romans wrote: Red Word that prompted transfer to Nurse Triage: severe sciatic nerve pain Reason for Disposition  [1] MODERATE pain (e.g., interferes with normal activities, limping) AND [2] present > 3 days  Answer Assessment - Initial Assessment Questions 1. ONSET: "When did the pain start?"      Yesterday afternoon.  2. LOCATION: "Where is the pain located?"      Shooting pain down front of  right leg.  3. PAIN: "How bad is the pain?"    (Scale 1-10; or mild, moderate, severe)   -  MILD (1-3): doesn't interfere with normal activities    -  MODERATE (4-7): interferes with normal activities (e.g., work or school) or awakens from sleep, limping    -  SEVERE (8-10): excruciating pain, unable to do any normal activities, unable to walk     She states at this moment there is no  pain, she states when it shoots it is more than a 10.  4. WORK OR EXERCISE: "Has there been any recent work or exercise that involved this part of the body?"      Denies.  5. CAUSE: "What do you think is causing the leg pain?"     Patient states this feels just like her sciatic pain but it is the right leg instead of left.  6. OTHER SYMPTOMS: "Do you have any other symptoms?" (e.g., chest pain, back pain, breathing difficulty, swelling, rash, fever, numbness, weakness)     Buttocks pain, (old) numbness in left hand from previous back surgeries.  Protocols used: Leg Pain-A-AH

## 2023-07-29 NOTE — Telephone Encounter (Signed)
"  Patient states she has history of multiple back surgeries and states it is normally in the left leg but is in the right leg today. Patient states she had been on tramadol for 30 years, and was taken off of it about 6 months ago. She states she now takes Cymbalta and cyclobenzaprine, but was being told she can not take both of those. Patient states she was just seen on 07/27/23 and would like to know what her PCP recommends she take for the pain. She states the tramadol had helped previously."  Please advise

## 2023-07-30 NOTE — Telephone Encounter (Signed)
 Pt returned call and states the shooting pain is gone today and will hold off on the medication and appt for now. She states her back is just a little sore but the shooting pain was so bad it was taking her breath away yesterday but she woke up this morning and it was gone. Advised pt to callback if symptoms come back and to make an appt to be evaluated, pt verbalized understanding

## 2023-07-30 NOTE — Telephone Encounter (Signed)
 LM for pt to call back.

## 2023-08-05 ENCOUNTER — Telehealth: Payer: Self-pay | Admitting: *Deleted

## 2023-08-05 NOTE — Telephone Encounter (Signed)
 Patient contacted the office requesting samples of [name]. Patient advised she may come to the office to pick up samples. Patient advised she will need to have labs done when she comes to pick up sample. Samples reserved in the cabinet for patient.

## 2023-08-06 ENCOUNTER — Encounter: Payer: Self-pay | Admitting: Internal Medicine

## 2023-08-06 ENCOUNTER — Other Ambulatory Visit: Payer: Self-pay | Admitting: *Deleted

## 2023-08-06 ENCOUNTER — Ambulatory Visit: Attending: Rheumatology | Admitting: Rheumatology

## 2023-08-06 ENCOUNTER — Telehealth: Payer: Self-pay | Admitting: Rheumatology

## 2023-08-06 ENCOUNTER — Encounter: Payer: Self-pay | Admitting: Rheumatology

## 2023-08-06 VITALS — BP 126/81 | HR 79 | Resp 17 | Ht 60.0 in | Wt 115.0 lb

## 2023-08-06 DIAGNOSIS — M533 Sacrococcygeal disorders, not elsewhere classified: Secondary | ICD-10-CM

## 2023-08-06 DIAGNOSIS — Z7952 Long term (current) use of systemic steroids: Secondary | ICD-10-CM

## 2023-08-06 DIAGNOSIS — R5383 Other fatigue: Secondary | ICD-10-CM

## 2023-08-06 DIAGNOSIS — M0579 Rheumatoid arthritis with rheumatoid factor of multiple sites without organ or systems involvement: Secondary | ICD-10-CM | POA: Diagnosis not present

## 2023-08-06 DIAGNOSIS — S62102D Fracture of unspecified carpal bone, left wrist, subsequent encounter for fracture with routine healing: Secondary | ICD-10-CM

## 2023-08-06 DIAGNOSIS — K219 Gastro-esophageal reflux disease without esophagitis: Secondary | ICD-10-CM

## 2023-08-06 DIAGNOSIS — Z96642 Presence of left artificial hip joint: Secondary | ICD-10-CM

## 2023-08-06 DIAGNOSIS — M1611 Unilateral primary osteoarthritis, right hip: Secondary | ICD-10-CM

## 2023-08-06 DIAGNOSIS — M810 Age-related osteoporosis without current pathological fracture: Secondary | ICD-10-CM

## 2023-08-06 DIAGNOSIS — M51369 Other intervertebral disc degeneration, lumbar region without mention of lumbar back pain or lower extremity pain: Secondary | ICD-10-CM | POA: Diagnosis not present

## 2023-08-06 DIAGNOSIS — E559 Vitamin D deficiency, unspecified: Secondary | ICD-10-CM

## 2023-08-06 DIAGNOSIS — G8929 Other chronic pain: Secondary | ICD-10-CM

## 2023-08-06 DIAGNOSIS — Z79899 Other long term (current) drug therapy: Secondary | ICD-10-CM

## 2023-08-06 DIAGNOSIS — M4312 Spondylolisthesis, cervical region: Secondary | ICD-10-CM

## 2023-08-06 DIAGNOSIS — F5101 Primary insomnia: Secondary | ICD-10-CM

## 2023-08-06 DIAGNOSIS — M79642 Pain in left hand: Secondary | ICD-10-CM

## 2023-08-06 DIAGNOSIS — G35 Multiple sclerosis: Secondary | ICD-10-CM

## 2023-08-06 DIAGNOSIS — Z8639 Personal history of other endocrine, nutritional and metabolic disease: Secondary | ICD-10-CM

## 2023-08-06 DIAGNOSIS — Z8679 Personal history of other diseases of the circulatory system: Secondary | ICD-10-CM

## 2023-08-06 DIAGNOSIS — M79641 Pain in right hand: Secondary | ICD-10-CM

## 2023-08-06 DIAGNOSIS — Z8659 Personal history of other mental and behavioral disorders: Secondary | ICD-10-CM

## 2023-08-06 DIAGNOSIS — M67431 Ganglion, right wrist: Secondary | ICD-10-CM

## 2023-08-06 DIAGNOSIS — G2581 Restless legs syndrome: Secondary | ICD-10-CM

## 2023-08-06 DIAGNOSIS — S62101D Fracture of unspecified carpal bone, right wrist, subsequent encounter for fracture with routine healing: Secondary | ICD-10-CM

## 2023-08-06 MED ORDER — TRIAMCINOLONE ACETONIDE 40 MG/ML IJ SUSP
40.0000 mg | INTRAMUSCULAR | Status: AC | PRN
Start: 2023-08-06 — End: 2023-08-06
  Administered 2023-08-06: 40 mg via INTRA_ARTICULAR

## 2023-08-06 MED ORDER — LIDOCAINE HCL 1 % IJ SOLN
1.5000 mL | INTRAMUSCULAR | Status: AC | PRN
Start: 2023-08-06 — End: 2023-08-06
  Administered 2023-08-06: 1.5 mL

## 2023-08-06 NOTE — Telephone Encounter (Signed)
 Patient worked into schedule 08/06/2023

## 2023-08-06 NOTE — Patient Instructions (Addendum)
Standing Labs We placed an order today for your standing lab work.   Please have your standing labs drawn in June and every 3 months  Please have your labs drawn 2 weeks prior to your appointment so that the provider can discuss your lab results at your appointment, if possible.  Please note that you may see your imaging and lab results in MyChart before we have reviewed them. We will contact you once all results are reviewed. Please allow our office up to 72 hours to thoroughly review all of the results before contacting the office for clarification of your results.  WALK-IN LAB HOURS  Monday through Thursday from 8:00 am -12:30 pm and 1:00 pm-5:00 pm and Friday from 8:00 am-12:00 pm.  Patients with office visits requiring labs will be seen before walk-in labs.  You may encounter longer than normal wait times. Please allow additional time. Wait times may be shorter on  Monday and Thursday afternoons.  We do not book appointments for walk-in labs. We appreciate your patience and understanding with our staff.   Labs are drawn by [facility]. Please bring your co-pay at the time of your lab draw.  You may receive a bill from [facility] for your lab work.  Please note if you are on Hydroxychloroquine and and an order has been placed for a Hydroxychloroquine level,  you will need to have it drawn 4 hours or more after your last dose.  If you wish to have your labs drawn at another location, please call the office 24 hours in advance so we can fax the orders.  The office is located at [address], [place], [place] [address]   If you have any questions regarding directions or hours of operation,  please call [phone].   As a reminder, please drink plenty of water prior to coming for your lab work. Thanks!   Vaccines You are taking a medication(s) that can suppress your immune system.  The following immunizations are recommended: Flu annually Covid-19  Td/Tdap (tetanus, diphtheria,  pertussis) every 10 years Pneumonia (Prevnar 15 then Pneumovax 23 at least 1 year apart.  Alternatively, can take Prevnar 20 without needing additional dose) Shingrix: 2 doses from 4 weeks to 6 months apart  Please check with your PCP to make sure you are up to date.  If you have signs or symptoms of an infection or start antibiotics: First, call your PCP for workup of your infection. Hold your medication through the infection, until you complete your antibiotics, and until symptoms resolve if you take the following: Injectable medication (Actemra, Benlysta, Cimzia, Cosentyx, Enbrel, Humira, Kevzara, Orencia, Remicade, Simponi, Stelara, Taltz, Tremfya) Methotrexate Leflunomide (Arava) Mycophenolate (Cellcept) Osborne [place], or [name]   Because you are taking Harriette Ohara, [name], or Olumiant, it is very important to know that this class of medications has a FDA BLACK BOX WARNING for major adverse cardiovascular events (MACE), thrombosis, mortality (including sudden cardiovascular death), serious infections, and lymphomas. MACE is defined as cardiovascular death, myocardial infarction, and stroke. Thrombosis includes deep venous thrombosis (DVT), pulmonary embolism (PE), and arterial thrombosis. If you are a current or former smoker, you are at higher risk for MACE.

## 2023-08-06 NOTE — Telephone Encounter (Signed)
 Medication Samples have been provided to the patient.  Drug name: [name]       Strength: 15 mg        Qty: 2  LOT: [id number]  Exp.Date: 03/2025  Dosing instructions: Take  one tablet by mouth daily.

## 2023-08-06 NOTE — Telephone Encounter (Signed)
 Patient is requesting right piriformis injection by Dr. Corliss Skains.  Patient was offered first available appointments on 3/24 at 10:00 am and 3/28 at 10:00.am.  Patient states she is only available in the afternoon and needs the appointment ASAP.  Please advise.

## 2023-08-06 NOTE — Progress Notes (Signed)
 Office Visit Note  Patient: Sherri [name]             Date of Birth: [birth year]           MRN: [id number]             PCP: Pincus Sanes, MD Referring: Pincus Sanes, MD Visit Date: 08/06/2023 Occupation: @GUAROCC @  Subjective:  Pain of the Right Hip and Pain of the Lower Back   History of Present Illness: Sherri [name] is a 83 y.o. female with seropositive rheumatoid arthritis, osteoarthritis and osteoporosis.  She was seen today in urgent basis due to severe pain and discomfort in her right hip and lower back region.  She states she continues to have back discomfort despite having surgery.  She has not had a flare of rheumatoid arthritis.  She remains on [name] 15 mg p.o. daily, methotrexate 5 tablets a week, folic acid 1 mg a day and prednisone 5 mg a day.  She had no interruption in the treatment.  For the pain management she remains on Cymbalta 30 mg p.o. daily.  She discontinued tramadol.  She had been getting Prolia injections for osteoporosis.  Her last Prolia injection was on February 05, 2023.    Activities of Daily Living:  Patient reports morning stiffness for 6 hours.   Patient Reports nocturnal pain.  Difficulty dressing/grooming: Reports Difficulty climbing stairs: Denies Difficulty getting out of chair: Reports Difficulty using hands for taps, buttons, cutlery, and/or writing: Reports  Review of Systems  Constitutional:  Positive for fatigue.  HENT:  Positive for mouth dryness. Negative for mouth sores.   Eyes:  Positive for dryness.  Respiratory:  Negative for shortness of breath.   Cardiovascular:  Negative for chest pain and palpitations.  Gastrointestinal:  Positive for constipation. Negative for blood in stool and diarrhea.  Endocrine: Negative for increased urination.  Genitourinary:  Negative for involuntary urination.  Musculoskeletal:  Positive for joint pain, gait problem, joint pain, joint swelling, myalgias, muscle weakness, morning  stiffness and myalgias. Negative for muscle tenderness.  Skin:  Positive for sensitivity to sunlight. Negative for color change, rash and hair loss.  Allergic/Immunologic: Negative for susceptible to infections.  Neurological:  Positive for dizziness and headaches.  Hematological:  Negative for swollen glands.  Psychiatric/Behavioral:  Positive for sleep disturbance. Negative for depressed mood. The patient is not nervous/anxious.     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   Cervical spondylosis with myelopathy and radiculopathy 04/23/2022   Constipation 03/04/2022   Upper back pain on left side 03/04/2022   Lumbar vertebral fracture, pathologic 09/20/2021   Degenerative spondylolisthesis 08/19/2021   Acute left lumbar radiculopathy 07/05/2021   Senile purpura (HCC) 12/19/2020   Restless leg 02/02/2020   Bilateral leg edema 01/11/2020   Cervical radiculopathy at C8 06/28/2019   Carpal tunnel syndrome on both sides 06/28/2019   Right sided sciatica 04/14/2018   Trochanteric bursitis of right hip 04/14/2018   H/O cervical spine surgery 11/10/2017   Dysphagia 10/26/2017   Gastroesophageal reflux disease 08/06/2017   Easy bruising 08/06/2017   Mid back pain 03/26/2017   Submandibular gland swelling 01/08/2017   High risk medication use 09/02/2016   Piriformis muscle pain 09/02/2016   Right hip pain 04/08/2016   Right knee pain 02/25/2016   Numbness and tingling in left hand 11/14/2014   Biceps tendon tear 10/10/2014  Insomnia 10/10/2014   Gait disturbance 06/13/2014   OP (osteoporosis) 01/18/2014   Other long term (current) drug therapy 01/18/2014   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 01/01/2013   History of colonic polyps 07/03/2010   Nocturia 05/28/2010   THYROID NODULE 05/17/2008   Multiple sclerosis (HCC) 05/17/2008   Other fatigue 03/30/2007   Hyperlipidemia 08/20/2006    Essential hypertension 08/20/2006   Rheumatoid arthritis (HCC) 08/20/2006   Osteoarthritis 08/20/2006   CERVICAL CANCER, HX OF 08/20/2006   GASTRIC ULCER, HX OF 08/20/2006    Past Medical History:  Diagnosis Date   Anxiety    takes Valium daily as needed   Basal cell carcinoma 01/21/1988   left nostril (MOHS), sup-right calf (CX35FU)   Basal cell carcinoma 03/22/1996   right calf (CX35FU)   Basal cell carcinoma 03/31/1995   left wing nose   Bruises easily    d/t meds   Cancer (HCC)    basal cell ca, in situ- uterine    Cataracts, bilateral    removed bilateral   Chronic back pain    Dizziness    r/t to meds   GERD (gastroesophageal reflux disease)    no meds on a regular basis but will take Tums if needed   Headache(784.0)    r/t neck issues   History of bronchitis 6-86yrs ago   History of colon polyps    benign   HOH (hard of hearing)    wears hearing aids   Hyperlipidemia    takes Atorvastatin on Mondays and Fridays   Hypertension    Joint pain    Joint swelling    Multiple sclerosis (HCC)    Neuromuscular disorder (HCC)    Dr. Stickney Nation- [facility], follows [facility]   Nocturia    PONV (postoperative nausea and vomiting)    trouble urinating after surgery in 2014   Postoperative nausea and vomiting 01/11/2019   Rheumatoid arthritis (HCC)    Dr Glo Herring weekly, RA- hands- knees- feet    Rheumatoid arthritis(714.0)    Dr Corliss Skains takes Harriette Ohara daily   Right wrist fracture    Skin cancer    Squamous cell carcinoma of skin 11/02/2001   in situ-right knee (cx12fu)   Squamous cell carcinoma of skin 11/12/2011   in situ-left forearm (CX35FU), in situ-left foot (CX35FU)   Squamous cell carcinoma of skin 01/22/2012   in situ-left lower forearm (txpbx)   Squamous cell carcinoma of skin 11/17/2012   right shin (txpbx)   Squamous cell carcinoma of skin 10/28/2013   in situ-right shoulder (CX35FU)   Squamous cell carcinoma of skin 04/28/2014   well  diff-right shin (txpbx)   Squamous cell carcinoma of skin 11/02/2014   in situ-Left shin (txpbx)   Squamous cell carcinoma of skin 12/15/2014   in situ-Left hand (txpbx), bowens-left side chest (txpbx)   Squamous cell carcinoma of skin 05/09/2015   in situ-left hand (txpbx)    Squamous cell carcinoma of skin 05/07/2016   in situ-left inner shin,ant, in situ-left inner shin, post, in situ-left outer forearm, in situ-right knuckle   Squamous cell carcinoma of skin 03/1302018   in situ-left shoulder (txpbx), in situ-right inner shin (txpbx), in situ-top of left foot (txpbx), KA- left forearm (txpbx)   Squamous cell carcinoma of skin 12/02/2016   in situ-left inner shin (txpbx)   Squamous cell carcinoma of skin 03/04/2017   in situ-left outer sup, shin (txpbx), in situ- right 2nd knuckle finger (txpbx), in situ- Left wrist (txpbx)  Squamous cell carcinoma of skin 04/06/2018   in situ-above left knee inner (txpbx)   Squamous cell carcinoma of skin 04/21/2018   in situ-right top hand (txpbx)   Squamous cell carcinoma of skin 07/08/2018   in situ-right lower inner shin (txpbx)   Squamous cell carcinoma of skin 04/27/2019   in situ- Left neck(CX35FU), In situ- right neck (CX35FU)   Urinary retention    sees Dr.Wrenn about 2 times a yr    Family History  Problem Relation Age of Onset   Cancer Mother        pancreatic   Diabetes Mother    Pancreatic cancer Mother    Heart disease Father        Rheumatic   Cancer Father        ? stomach   Stomach cancer Father    Cancer Sister        stomach   Stomach cancer Sister    Cancer Maternal Aunt        X 4; ? primary   Heart disease Paternal Aunt    Cancer Maternal Grandmother        cervical   Colon cancer Other        Aunts   Multiple sclerosis Daughter    Hypertension Neg Hx    Stroke Neg Hx    Esophageal cancer Neg Hx    Rectal cancer Neg Hx    Past Surgical History:  Procedure Laterality Date   ABDOMINAL HYSTERECTOMY      1985   ANTERIOR CERVICAL DECOMP/DISCECTOMY FUSION N/A 04/23/2022   Procedure: Anterior Cervical Decompression/Discectomy Fusion - Cervical Seven-Thoracic One;  Surgeon: Julio Sicks, MD;  Location: [facility] OR;  Service: Neurosurgery;  Laterality: N/A;  3C   APPENDECTOMY     with TAH   BACK SURGERY     several   BREAST BIOPSY Right    Results were negative   BROW LIFT Bilateral 10/02/2016   Procedure: BILATERAL LOWER LID BLEPHAROPLASTY;  Surgeon: Floydene Flock, MD;  Location: [facility] OR;  Service: Plastics;  Laterality: Bilateral;   CARPAL TUNNEL RELEASE Right    cataracts     CERVICAL FUSION     Dr Jeral Fruit   CERVICAL FUSION  12/31/2012   Dr Jeral Fruit   CERVICAL FUSION  04/2022   CHEST TUBE INSERTION     for traumatic Pneumothorax   COLONOSCOPY  07/16/2010   normal    COLONOSCOPY W/ POLYPECTOMY  1997   negative since; Dr Arlyce Dice   ESOPHAGOGASTRODUODENOSCOPY  07/16/2010   normal   eye lid raise     EYE SURGERY Bilateral    cataracts removed - /w IOL   HARDWARE REMOVAL  08/2021   JOINT REPLACEMENT     LUMBAR FUSION     Dr Jeral Fruit   NASAL SINUS SURGERY     POSTERIOR CERVICAL FUSION/FORAMINOTOMY N/A 12/31/2012   Procedure: POSTERIOR LATERAL CERVICAL FUSION/FORAMINOTOMY LEVEL 1 CERVICAL THREE-FOUR WITH LATERAL MASS SCREWS;  Surgeon: Karn Cassis, MD;  Location: [facility];  Service: Neurosurgery;  Laterality: N/A;   PTOSIS REPAIR Bilateral 10/02/2016   Procedure: INTERNAL PTOSIS REPAIR;  Surgeon: Floydene Flock, MD;  Location: [facility] OR;  Service: Plastics;  Laterality: Bilateral;   SKIN BIOPSY     THYROID SURGERY     R lobe removed- 1972, has grown back - CT last done- 2018   TOTAL HIP ARTHROPLASTY  12/22/2011   Procedure: TOTAL HIP ARTHROPLASTY;  Surgeon: Nestor Lewandowsky, MD;  Location: [facility]  OR;  Service: Orthopedics;  Laterality: Left;   TOTAL SHOULDER ARTHROPLASTY     TUBAL LIGATION     UPPER GASTROINTESTINAL ENDOSCOPY  2012   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   WRIST SURGERY  Left 09/16/2022   wrist/arm   Social History   Social History Narrative   Not on file   Immunization History  Administered Date(s) Administered   Fluad Quad(high Dose 65+) 02/02/2020, 02/01/2022   Influenza Whole 02/22/2007, 02/08/2013   Influenza, High Dose Seasonal PF 02/04/2015, 02/18/2017, 02/21/2018, 01/16/2019, 01/25/2021, 02/02/2023   Influenza-Unspecified 01/24/2014, 01/25/2016, 02/18/2017, 01/16/2019, 01/25/2020   Moderna Covid-19 Fall Seasonal Vaccine 41yrs & older 02/02/2023   Moderna Covid-19 Vaccine Bivalent Booster 60yrs & up 02/11/2022   PFIZER(Purple Top)SARS-COV-2 Vaccination 06/18/2019, 07/09/2019, 01/09/2020, 07/03/2020, 02/07/2021   PNEUMOCOCCAL CONJUGATE-20 02/26/2022   Pfizer Covid-19 Vaccine Bivalent Booster 41yrs & up 02/08/2021   Pneumococcal Conjugate-13 11/23/2012, 09/08/2014   Pneumococcal Polysaccharide-23 05/26/2006   Pneumococcal-Unspecified 12/28/2012, 06/09/2020   Respiratory Syncytial Virus Vaccine,Recomb Aduvanted(Arexvy) 01/23/2022   Td 05/28/2010   Tdap 12/24/2020, 01/07/2021   Unspecified SARS-COV-2 Vaccination 06/26/2020, 02/11/2022   Varicella 11/07/2020   Zoster Recombinant(Shingrix) 02/10/2019, 04/14/2019   Zoster, Live 05/26/2009, 07/10/2020     Objective: Vital Signs: BP 126/81 (BP Location: Left Arm, Patient Position: Sitting, Cuff Size: Normal)   Pulse 79   Resp 17   Ht 5' (1.524 m)   Wt 115 lb (52.2 kg)   BMI 22.46 kg/m    Physical Exam Vitals and nursing note reviewed.  Constitutional:      Appearance: She is well-developed.  HENT:     Head: Normocephalic and atraumatic.  Eyes:     Conjunctiva/sclera: Conjunctivae normal.  Cardiovascular:     Rate and Rhythm: Normal rate and regular rhythm.     Heart sounds: Normal heart sounds.  Pulmonary:     Effort: Pulmonary effort is normal.     Breath sounds: Normal breath sounds.  Abdominal:     General: Bowel sounds are normal.     Palpations: Abdomen is soft.   Musculoskeletal:     Cervical back: Normal range of motion.  Lymphadenopathy:     Cervical: No cervical adenopathy.  Skin:    General: Skin is warm and dry.     Capillary Refill: Capillary refill takes less than 2 seconds.  Neurological:     Mental Status: She is alert and oriented to person, place, and time.  Psychiatric:        Behavior: Behavior normal.      Musculoskeletal Exam: She had limited lateral rotation of the cervical spine.  She had painful limited range of motion of the lumbar spine.  She had tenderness over right SI joint.  Shoulder joints, elbow joints, wrist joints were in good range of motion.  She had bilateral MCP thickening with ulnar deviation.  She had thickening of the PIP joints with subluxation of the right fifth PIP joint.  DIP and PIP thickening with limited flexion and extension was noted.  Hip joints and knee joints in good range of motion.  There was no tenderness over ankles or MTPs.  CDAI Exam: CDAI Score: 8  Patient Global: 30 / 100; Provider Global: 30 / 100 Swollen: 1 ; Tender: 2  Joint Exam 08/06/2023      Right  Left  PIP 4 (finger)  Swollen Tender     Sacroiliac   Tender        Investigation: No additional findings.  Imaging: No  results found.  Recent Labs: Lab Results  Component Value Date   WBC 3.9 05/14/2023   HGB 11.8 05/14/2023   PLT 255 05/14/2023   NA 138 05/14/2023   K 4.7 05/14/2023   CL 103 05/14/2023   CO2 29 05/14/2023   GLUCOSE 86 05/14/2023   BUN 10 05/14/2023   CREATININE 0.95 05/14/2023   BILITOT 0.4 05/14/2023   ALKPHOS 29 (L) 04/10/2020   AST 22 05/14/2023   ALT 16 05/14/2023   PROT 5.8 (L) 05/14/2023   ALBUMIN 4.2 04/10/2020   CALCIUM 8.9 05/14/2023   GFRAA 65 09/26/2020   QFTBGOLDPLUS NEGATIVE 11/03/2022    November 03, 2022 vitamin D 46  Speciality Comments: Prior therapy includes: Harriette Ohara (cost), Olumiant-inadequate response, Imuran weakness, leflunomide-intolerance Prolia: 05/04/18, 11/12/18,  06/09/19, 12/07/19, 07/05/20, 01/01/21, 07/09/21, 01/09/22, 07/24/22  Procedures:  Sacroiliac Joint Inj on 08/06/2023 4:06 PM Indications: pain Details: 27 G 1.5 in needle, posterior approach Medications: 1.5 mL lidocaine 1 %; 40 mg triamcinolone acetonide 40 MG/ML Aspirate: 0 mL Outcome: tolerated well, no immediate complications Procedure, treatment alternatives, risks and benefits explained, specific risks discussed. Consent was given by the patient. Immediately prior to procedure a time out was called to verify the correct patient, procedure, equipment, support staff and site/side marked as required. Patient was prepped and draped in the usual sterile fashion.     Allergies: Darvon [propoxyphene hcl], Demerol [meperidine], Penicillins, Imuran [azathioprine], Sulfa antibiotics, Arava [leflunomide], Lipitor [atorvastatin], and Vytorin [ezetimibe-simvastatin]   Assessment / Plan:     Visit Diagnoses: Rheumatoid arthritis involving multiple sites with positive rheumatoid factor (HCC)-patient states her rheumatoid arthritis is quite well-controlled on the combination of [name], methotrexate and prednisone.  She continues to have mild inflammation in her PIP joints.  She had no tenderness on the examination over the MCPs or PIPs today.  High risk medication use - [name] 15 mg 1 tablet by mouth daily, Methotrexate 5 tablets weekly, folic acid 1 mg by mouth daily, prednisone 5 mg 1 tablet by mouth daily.May 14, 2023 CBC normal, CMP normal, November 03, 2022 TB Gold negative.  She had labs drawn today.  Patient was advised to get repeat labs every 3 months.  Information immunization was placed in the AVS.  She was advised to hold [name] and methotrexate if she develops an infection resume after the infection resolves.  Blackbox warning associated with check in the betters including major adverse cardiovascular events, thrombosis, stroke, MI, pulmonary embolism were also discussed at length.  Handout was placed  in the 80s.  Long term (current) use of systemic steroids - Prednisone 5 mg p.o. daily.  She has been taking long-term prednisone and is unable to taper prednisone due to severity of her disease.  Increased risk of hypertension, diabetes, osteoporosis, weight gain and heart disease was also discussed.  Pain in both hands-she continues to have some pain and stiffness in her hands.  Mild synovitis was noted in the PIP joints.  She has bilateral ulnar deviation.  Ganglion cyst of volar aspect of right wrist-asymptomatic.  Primary osteoarthritis of right hip-she denies discomfort today.  History of total hip replacement, left - By Dr. Turner Daniels.  Doing well.  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 - C7-T1 anterior discectomy and fusion 2023 by Dr. Dutch Quint.  She is better range of motion.  Degeneration of intervertebral disc of lumbar region without discogenic back pain or lower extremity pain - History of surgery x 2 by Dr. Dutch Quint in 2023.  She continues to  have lower back pain despite having surgery.  Chronic right SI joint pain-she has been having pain and discomfort in her right SI joint.  Patient requested right SI joint injection.  After informed consent was obtained right SI joint was injected with lidocaine and Kenalog as described above.  Side effects including increased risk of infection, nerve and tendon injury, dermal atrophy and hypopigmentation were discussed.  Patient tolerated the procedure well.  Postprocedure instructions were given.  Closed fracture of left wrist with routine healing, subsequent encounter  Closed fracture of right wrist with routine healing, subsequent encounter  Age-related osteoporosis without current pathological fracture - DEXA 04/14/22: RFN T-score -3.2. Current treatment-prolia 60 mg sq injections every 6 months- last prolia injection: 02/05/2023.  She is taking calcium and vitamin D.  Vitamin D deficiency-vitamin D was 46 on November 03, 2022.  The medical  problems listed as follows:  History of hyperlipidemia  History of hypertension-blood pressure was normal today at 126/81.  Restless leg  Gastroesophageal reflux disease without esophagitis   History of multiple sclerosis (HCC)  History of depression - Patient is on Cymbalta and tramadol.  History of anxiety  Primary insomnia  Other fatigue  Orders: No orders of the defined types were placed in this encounter.  No orders of the defined types were placed in this encounter.    Follow-Up Instructions: Return in about 5 months (around 01/06/2024) for Rheumatoid arthritis, Osteoarthritis, Osteoporosis.   Pollyann Savoy, MD  Note - This record has been created using Animal nutritionist.  Chart creation errors have been sought, but may not always  have been located. Such creation errors do not reflect on  the standard of medical care.

## 2023-08-07 LAB — CBC WITH DIFFERENTIAL/PLATELET
Absolute Lymphocytes: 1031 {cells}/uL (ref 850–3900)
Absolute Monocytes: 475 {cells}/uL (ref 200–950)
Basophils Absolute: 22 {cells}/uL (ref 0–200)
Basophils Relative: 0.4 %
Eosinophils Absolute: 11 {cells}/uL — ABNORMAL LOW (ref 15–500)
Eosinophils Relative: 0.2 %
HCT: 36.6 % (ref 35.0–45.0)
Hemoglobin: 12.6 g/dL (ref 11.7–15.5)
MCH: 34 pg — ABNORMAL HIGH (ref 27.0–33.0)
MCHC: 34.4 g/dL (ref 32.0–36.0)
MCV: 98.7 fL (ref 80.0–100.0)
MPV: 9.1 fL (ref 7.5–12.5)
Monocytes Relative: 8.8 %
Neutro Abs: 3861 {cells}/uL (ref 1500–7800)
Neutrophils Relative %: 71.5 %
Platelets: 320 10*3/uL (ref 140–400)
RBC: 3.71 10*6/uL — ABNORMAL LOW (ref 3.80–5.10)
RDW: 15.6 % — ABNORMAL HIGH (ref 11.0–15.0)
Total Lymphocyte: 19.1 %
WBC: 5.4 10*3/uL (ref 3.8–10.8)

## 2023-08-07 LAB — LIPID PANEL
Cholesterol: 238 mg/dL — ABNORMAL HIGH (ref <200)
HDL: 109 mg/dL (ref >=50)
LDL Cholesterol (Calc): 115 mg/dL — ABNORMAL HIGH
Non-HDL Cholesterol (Calc): 129 mg/dL (ref <130)
Total CHOL/HDL Ratio: 2.2 (calc) (ref <5.0)
Triglycerides: 53 mg/dL (ref <150)

## 2023-08-07 LAB — HEMOGLOBIN A1C
Hgb A1c MFr Bld: 6 %{Hb} — ABNORMAL HIGH (ref <5.7)
Mean Plasma Glucose: 126 mg/dL
eAG (mmol/L): 7 mmol/L

## 2023-08-07 LAB — VITAMIN D 25 HYDROXY (VIT D DEFICIENCY, FRACTURES): Vit D, 25-Hydroxy: 44 ng/mL (ref 30–100)

## 2023-08-07 LAB — COMPLETE METABOLIC PANEL WITH GFR
AG Ratio: 2 (calc) (ref 1.0–2.5)
ALT: 23 U/L (ref 6–29)
AST: 31 U/L (ref 10–35)
Albumin: 4.5 g/dL (ref 3.6–5.1)
Alkaline phosphatase (APISO): 23 U/L — ABNORMAL LOW (ref 37–153)
BUN/Creatinine Ratio: 16 (calc) (ref 6–22)
BUN: 16 mg/dL (ref 7–25)
CO2: 31 mmol/L (ref 20–32)
Calcium: 9.6 mg/dL (ref 8.6–10.4)
Chloride: 97 mmol/L — ABNORMAL LOW (ref 98–110)
Creat: 1.03 mg/dL — ABNORMAL HIGH (ref 0.60–0.95)
Globulin: 2.2 g/dL (ref 1.9–3.7)
Glucose, Bld: 99 mg/dL (ref 65–99)
Potassium: 4.2 mmol/L (ref 3.5–5.3)
Sodium: 137 mmol/L (ref 135–146)
Total Bilirubin: 0.6 mg/dL (ref 0.2–1.2)
Total Protein: 6.7 g/dL (ref 6.1–8.1)
eGFR: 55 mL/min/{1.73_m2} — ABNORMAL LOW (ref >=60)

## 2023-08-07 NOTE — Progress Notes (Signed)
 Creatinine is mildly elevated and stable.  CBC is stable.  Patient should increase water intake and avoid NSAIDs.

## 2023-08-07 NOTE — Progress Notes (Signed)
 Total cholesterol is elevated and LDL is elevated-115.  Vitamin D WNL Hgb A1c 6.0% Please notify the patient and forward results to PCP

## 2023-08-13 DIAGNOSIS — S81801A Unspecified open wound, right lower leg, initial encounter: Secondary | ICD-10-CM | POA: Diagnosis not present

## 2023-08-15 ENCOUNTER — Encounter: Payer: Self-pay | Admitting: Internal Medicine

## 2023-08-17 ENCOUNTER — Other Ambulatory Visit: Payer: Self-pay | Admitting: Internal Medicine

## 2023-08-24 DIAGNOSIS — H04123 Dry eye syndrome of bilateral lacrimal glands: Secondary | ICD-10-CM | POA: Diagnosis not present

## 2023-08-24 DIAGNOSIS — H5213 Myopia, bilateral: Secondary | ICD-10-CM | POA: Diagnosis not present

## 2023-08-24 DIAGNOSIS — H524 Presbyopia: Secondary | ICD-10-CM | POA: Diagnosis not present

## 2023-08-24 DIAGNOSIS — Z961 Presence of intraocular lens: Secondary | ICD-10-CM | POA: Diagnosis not present

## 2023-08-27 DIAGNOSIS — T148XXA Other injury of unspecified body region, initial encounter: Secondary | ICD-10-CM | POA: Diagnosis not present

## 2023-08-27 DIAGNOSIS — M79604 Pain in right leg: Secondary | ICD-10-CM | POA: Diagnosis not present

## 2023-08-28 ENCOUNTER — Other Ambulatory Visit (HOSPITAL_COMMUNITY): Payer: Self-pay

## 2023-08-28 ENCOUNTER — Telehealth: Payer: Self-pay | Admitting: Pharmacist

## 2023-08-28 NOTE — Telephone Encounter (Signed)
 Patient due for Prolia on 08/04/2023. Per test claim, copay is $555. ATC patient to see if patient wants to receive at Infusion Center  Will f/u  Chesley Mires, PharmD, MPH, BCPS, CPP Clinical Pharmacist (Rheumatology and Pulmonology)

## 2023-09-01 DIAGNOSIS — T148XXA Other injury of unspecified body region, initial encounter: Secondary | ICD-10-CM | POA: Diagnosis not present

## 2023-09-02 ENCOUNTER — Telehealth: Payer: Self-pay

## 2023-09-02 ENCOUNTER — Other Ambulatory Visit: Payer: Self-pay | Admitting: *Deleted

## 2023-09-02 ENCOUNTER — Other Ambulatory Visit: Payer: Self-pay | Admitting: Internal Medicine

## 2023-09-02 ENCOUNTER — Other Ambulatory Visit: Payer: Self-pay | Admitting: Pharmacist

## 2023-09-02 DIAGNOSIS — Z79899 Other long term (current) drug therapy: Secondary | ICD-10-CM

## 2023-09-02 MED ORDER — METHOTREXATE SODIUM 2.5 MG PO TABS: 12.5000 mg | ORAL_TABLET | ORAL | 0 refills | Status: DC

## 2023-09-02 MED ORDER — FOLIC ACID 1 MG PO TABS: 1.0000 mg | ORAL_TABLET | Freq: Every day | ORAL | 3 refills | Status: AC

## 2023-09-02 NOTE — Telephone Encounter (Signed)
 Copied from CRM [id number]. Topic: Clinical - Medication Refill >> Sep 02, 2023 11:19 AM Cammy Copa D wrote: Most Recent Primary Care Visit:  Provider: BURNS, Bobette Mo  Department: [facility]  Visit Type: OFFICE VISIT  Date: 07/27/2023  Medication: furosemide (LASIX) 20 MG tablet  Has the patient contacted their pharmacy? Yes (Agent: If no, request that the patient contact the pharmacy for the refill. If patient does not wish to contact the pharmacy document the reason why and proceed with request.) (Agent: If yes, when and what did the pharmacy advise?) Pharmacy advised to call office.  Is this the correct pharmacy for this prescription? Yes If no, delete pharmacy and type the correct one.  This is the patient's preferred pharmacy:  [id number] [place], [place] - [address] [place] [address] Phone: [phone] Fax: [phone]   Has the prescription been filled recently? No  Is the patient out of the medication? Yes  Has the patient been seen for an appointment in the last year OR does the patient have an upcoming appointment? Yes  Can we respond through MyChart? Yes  Agent: Please be advised that Rx refills may take up to 3 business days. We ask that you follow-up with your pharmacy.

## 2023-09-02 NOTE — Telephone Encounter (Signed)
 Spoke with patient - she will move forward with completing Prolia at infusion center at [facility]. Referral placed

## 2023-09-02 NOTE — Telephone Encounter (Signed)
 Spoke with patient regarding prior message   Copied from CRM [id number]. Topic: Clinical - Medical Advice >> Sep 01, 2023  2:21 PM Konrad Dolores wrote: Reason for CRM: [place] called requesting a call back from Cristi Loron regarding her prolia injection. Please call her back at [phone].   Spoke with Patient regarding prior message . As I was looking into patient's chart it looks like Devki has tried to contact patient. I advised patient I will send this message to [facility].  Patient's voice was understanding.    Devki can you please advise   Thank you

## 2023-09-02 NOTE — Progress Notes (Signed)
 Next Prolia SQ due on 08/04/2023. Diagnosis: age-related osteoporosis  Dose: 60 mg SQ every 6 months  Last Clinic Visit: 08/06/2023 Next Clinic Visit: 11/25/2023  Last Prolia dose: 02/05/2024  Labs: 08/06/2023 Last DEXA: 04/21/2022 Next DEXA due: Nov 2025 (has bone scan of lower extremity on 01/19/2023)  Orders placed for Prolia x 1 dose. No premedicatons required.   Referral to [facility] placed due to increased cost  Chesley Mires, PharmD, MPH, BCPS, CPP Clinical Pharmacist (Rheumatology and Pulmonology)

## 2023-09-02 NOTE — Addendum Note (Signed)
 Addended by: Henriette Combs on: 09/02/2023 01:42 PM   Modules accepted: Orders

## 2023-09-02 NOTE — Telephone Encounter (Signed)
 Copied from CRM [id number]. Topic: Clinical - Medical Advice >> Sep 01, 2023  2:21 PM Konrad Dolores wrote: Reason for CRM: [place] called requesting a call back from Cristi Loron regarding her prolia injection. Please call her back at [phone].  Spoke with Patient regarding prior message . As I was looking into patient's chart it looks like Devki has tried to contact patient. I advised patient I will send this message to [facility].  Patient's voice was understanding.

## 2023-09-02 NOTE — Telephone Encounter (Signed)
 Handled in previous noted

## 2023-09-02 NOTE — Telephone Encounter (Signed)
 Devki, patient will be scheduled as soon as possible.  Auth Submission: APPROVED - Renewed Site of care: Site of care: [facility] Payer: [employer] medicare Medication & CPT/J Code(s) submitted: Prolia (Denosumab) [id number] Route of submission (phone, fax, portal): portal Phone # Fax # Auth type: Buy/Bill PB Units/visits requested: 60mg  x 2 doses Reference number: [id number] Approval from: 09/02/23 to 08/29/24

## 2023-09-02 NOTE — Telephone Encounter (Signed)
 Medication Samples have been provided to the patient.  Drug name: [name]       Strength: 15 mg        Qty: 2   LOT: [id number] Exp.Date: 04/11/2025  Dosing instructions: Take one tablet by mouth daily.

## 2023-09-02 NOTE — Telephone Encounter (Signed)
 Patient contacted the office to request refill on MTX and [facility]olic [facility] [address] Patient also requested to pick up samples of [name]. Patient advised she is up to date on labs and may pick up samples. Reserved samples in the cabinet for patient.   Last Fill: 03/02/2023 (03/02/2023), 01/23/2023 (MTX)  Labs: 08/06/2023 Creatinine is mildly elevated and stable.  CBC is stable.   Next Visit: 11/25/2023  Last Visit: 08/06/2023  DX: Rheumatoid arthritis involving multiple sites with positive rheumatoid factor   Current Dose per office note 08/06/2023: Methotrexate 5 tablets weekly, folic acid 1 mg by mouth daily,   Okay to refill Methotrexate and Folic Acid?

## 2023-09-06 ENCOUNTER — Other Ambulatory Visit: Payer: Self-pay | Admitting: Internal Medicine

## 2023-09-07 DIAGNOSIS — T148XXA Other injury of unspecified body region, initial encounter: Secondary | ICD-10-CM | POA: Diagnosis not present

## 2023-09-07 NOTE — Progress Notes (Signed)
 Prolia scheduled for 09/14/23

## 2023-09-10 ENCOUNTER — Ambulatory Visit: Payer: Medicare Other | Admitting: Neurology

## 2023-09-10 DIAGNOSIS — S81801S Unspecified open wound, right lower leg, sequela: Secondary | ICD-10-CM | POA: Diagnosis not present

## 2023-09-14 ENCOUNTER — Ambulatory Visit: Admitting: *Deleted

## 2023-09-14 VITALS — BP 133/79 | HR 71 | Temp 98.1°F | Resp 16 | Ht 61.0 in | Wt 119.6 lb

## 2023-09-14 DIAGNOSIS — M810 Age-related osteoporosis without current pathological fracture: Secondary | ICD-10-CM | POA: Diagnosis not present

## 2023-09-14 DIAGNOSIS — M4312 Spondylolisthesis, cervical region: Secondary | ICD-10-CM | POA: Diagnosis not present

## 2023-09-14 DIAGNOSIS — Z9889 Other specified postprocedural states: Secondary | ICD-10-CM

## 2023-09-14 MED ORDER — DENOSUMAB 60 MG/ML ~~LOC~~ SOSY
60.0000 mg | PREFILLED_SYRINGE | Freq: Once | SUBCUTANEOUS | Status: AC
  Administered 2023-09-14: 60 mg via SUBCUTANEOUS
  Filled 2023-09-14: qty 1

## 2023-09-14 NOTE — Progress Notes (Signed)
 Diagnosis: Osteoporosis  Provider:  Chilton Greathouse MD  Procedure: Injection  Prolia (Denosumab), Dose: 60 mg, Site: subcutaneous, Number of injections: 1  Injection Site(s): Right arm  Post Care: Observation period completed  Discharge: Condition: Good, Destination: Home . AVS Provided  Performed by:  Forrest Moron, RN

## 2023-09-18 DIAGNOSIS — S81801D Unspecified open wound, right lower leg, subsequent encounter: Secondary | ICD-10-CM | POA: Diagnosis not present

## 2023-09-21 ENCOUNTER — Encounter: Payer: Self-pay | Admitting: Internal Medicine

## 2023-09-25 DIAGNOSIS — S81801D Unspecified open wound, right lower leg, subsequent encounter: Secondary | ICD-10-CM | POA: Diagnosis not present

## 2023-09-28 ENCOUNTER — Ambulatory Visit: Admitting: Physician Assistant

## 2023-10-02 ENCOUNTER — Emergency Department (HOSPITAL_COMMUNITY)

## 2023-10-02 ENCOUNTER — Inpatient Hospital Stay (HOSPITAL_COMMUNITY)
Admission: EM | Admit: 2023-10-02 | Discharge: 2023-10-09 | DRG: 871 | Disposition: A | Discharge status: Skilled Nursing Facility | Attending: Internal Medicine | Admitting: Internal Medicine

## 2023-10-02 ENCOUNTER — Telehealth: Payer: Self-pay | Admitting: Internal Medicine

## 2023-10-02 ENCOUNTER — Ambulatory Visit

## 2023-10-02 DIAGNOSIS — E785 Hyperlipidemia, unspecified: Secondary | ICD-10-CM | POA: Diagnosis present

## 2023-10-02 DIAGNOSIS — E876 Hypokalemia: Secondary | ICD-10-CM | POA: Diagnosis not present

## 2023-10-02 DIAGNOSIS — Z885 Allergy status to narcotic agent status: Secondary | ICD-10-CM | POA: Diagnosis not present

## 2023-10-02 DIAGNOSIS — Z7952 Long term (current) use of systemic steroids: Secondary | ICD-10-CM | POA: Diagnosis not present

## 2023-10-02 DIAGNOSIS — Z800 Family history of malignant neoplasm of digestive organs: Secondary | ICD-10-CM

## 2023-10-02 DIAGNOSIS — Z8507 Personal history of malignant neoplasm of pancreas: Secondary | ICD-10-CM | POA: Diagnosis not present

## 2023-10-02 DIAGNOSIS — G8911 Acute pain due to trauma: Secondary | ICD-10-CM | POA: Diagnosis not present

## 2023-10-02 DIAGNOSIS — Z8249 Family history of ischemic heart disease and other diseases of the circulatory system: Secondary | ICD-10-CM

## 2023-10-02 DIAGNOSIS — W010XXA Fall on same level from slipping, tripping and stumbling without subsequent striking against object, initial encounter: Secondary | ICD-10-CM | POA: Diagnosis present

## 2023-10-02 DIAGNOSIS — Z87891 Personal history of nicotine dependence: Secondary | ICD-10-CM | POA: Diagnosis not present

## 2023-10-02 DIAGNOSIS — K219 Gastro-esophageal reflux disease without esophagitis: Secondary | ICD-10-CM | POA: Diagnosis present

## 2023-10-02 DIAGNOSIS — M6282 Rhabdomyolysis: Secondary | ICD-10-CM | POA: Diagnosis present

## 2023-10-02 DIAGNOSIS — Z880 Allergy status to penicillin: Secondary | ICD-10-CM

## 2023-10-02 DIAGNOSIS — G9341 Metabolic encephalopathy: Secondary | ICD-10-CM | POA: Diagnosis not present

## 2023-10-02 DIAGNOSIS — Z85828 Personal history of other malignant neoplasm of skin: Secondary | ICD-10-CM

## 2023-10-02 DIAGNOSIS — M0579 Rheumatoid arthritis with rheumatoid factor of multiple sites without organ or systems involvement: Secondary | ICD-10-CM | POA: Diagnosis not present

## 2023-10-02 DIAGNOSIS — Z974 Presence of external hearing-aid: Secondary | ICD-10-CM

## 2023-10-02 DIAGNOSIS — Z888 Allergy status to other drugs, medicaments and biological substances status: Secondary | ICD-10-CM

## 2023-10-02 DIAGNOSIS — S52021A Displaced fracture of olecranon process without intraarticular extension of right ulna, initial encounter for closed fracture: Secondary | ICD-10-CM | POA: Diagnosis not present

## 2023-10-02 DIAGNOSIS — G8929 Other chronic pain: Secondary | ICD-10-CM | POA: Diagnosis present

## 2023-10-02 DIAGNOSIS — G35 Multiple sclerosis: Secondary | ICD-10-CM | POA: Diagnosis present

## 2023-10-02 DIAGNOSIS — E861 Hypovolemia: Secondary | ICD-10-CM | POA: Diagnosis present

## 2023-10-02 DIAGNOSIS — M79604 Pain in right leg: Principal | ICD-10-CM

## 2023-10-02 DIAGNOSIS — Y92009 Unspecified place in unspecified non-institutional (private) residence as the place of occurrence of the external cause: Secondary | ICD-10-CM

## 2023-10-02 DIAGNOSIS — I771 Stricture of artery: Secondary | ICD-10-CM | POA: Diagnosis not present

## 2023-10-02 DIAGNOSIS — Z833 Family history of diabetes mellitus: Secondary | ICD-10-CM | POA: Diagnosis not present

## 2023-10-02 DIAGNOSIS — R2241 Localized swelling, mass and lump, right lower limb: Secondary | ICD-10-CM | POA: Diagnosis not present

## 2023-10-02 DIAGNOSIS — R6520 Severe sepsis without septic shock: Secondary | ICD-10-CM | POA: Diagnosis not present

## 2023-10-02 DIAGNOSIS — M79661 Pain in right lower leg: Secondary | ICD-10-CM | POA: Diagnosis not present

## 2023-10-02 DIAGNOSIS — I6782 Cerebral ischemia: Secondary | ICD-10-CM | POA: Diagnosis not present

## 2023-10-02 DIAGNOSIS — A419 Sepsis, unspecified organism: Principal | ICD-10-CM | POA: Diagnosis present

## 2023-10-02 DIAGNOSIS — Z860100 Personal history of colon polyps, unspecified: Secondary | ICD-10-CM

## 2023-10-02 DIAGNOSIS — L0390 Cellulitis, unspecified: Secondary | ICD-10-CM | POA: Diagnosis not present

## 2023-10-02 DIAGNOSIS — E871 Hypo-osmolality and hyponatremia: Secondary | ICD-10-CM | POA: Diagnosis not present

## 2023-10-02 DIAGNOSIS — M19071 Primary osteoarthritis, right ankle and foot: Secondary | ICD-10-CM | POA: Diagnosis not present

## 2023-10-02 DIAGNOSIS — I10 Essential (primary) hypertension: Secondary | ICD-10-CM | POA: Diagnosis not present

## 2023-10-02 DIAGNOSIS — Z0389 Encounter for observation for other suspected diseases and conditions ruled out: Secondary | ICD-10-CM | POA: Diagnosis not present

## 2023-10-02 DIAGNOSIS — L8990 Pressure ulcer of unspecified site, unspecified stage: Secondary | ICD-10-CM | POA: Insufficient documentation

## 2023-10-02 DIAGNOSIS — M069 Rheumatoid arthritis, unspecified: Secondary | ICD-10-CM | POA: Diagnosis present

## 2023-10-02 DIAGNOSIS — Z79899 Other long term (current) drug therapy: Secondary | ICD-10-CM

## 2023-10-02 DIAGNOSIS — R609 Edema, unspecified: Secondary | ICD-10-CM | POA: Diagnosis not present

## 2023-10-02 DIAGNOSIS — I872 Venous insufficiency (chronic) (peripheral): Secondary | ICD-10-CM | POA: Diagnosis not present

## 2023-10-02 DIAGNOSIS — M545 Low back pain, unspecified: Secondary | ICD-10-CM | POA: Diagnosis present

## 2023-10-02 DIAGNOSIS — M7989 Other specified soft tissue disorders: Secondary | ICD-10-CM | POA: Diagnosis not present

## 2023-10-02 DIAGNOSIS — L89309 Pressure ulcer of unspecified buttock, unspecified stage: Secondary | ICD-10-CM | POA: Insufficient documentation

## 2023-10-02 DIAGNOSIS — Z820 Family history of epilepsy and other diseases of the nervous system: Secondary | ICD-10-CM

## 2023-10-02 DIAGNOSIS — F419 Anxiety disorder, unspecified: Secondary | ICD-10-CM | POA: Diagnosis present

## 2023-10-02 DIAGNOSIS — Z90710 Acquired absence of both cervix and uterus: Secondary | ICD-10-CM

## 2023-10-02 DIAGNOSIS — R531 Weakness: Secondary | ICD-10-CM | POA: Diagnosis not present

## 2023-10-02 DIAGNOSIS — L03115 Cellulitis of right lower limb: Secondary | ICD-10-CM | POA: Diagnosis present

## 2023-10-02 DIAGNOSIS — Z96649 Presence of unspecified artificial hip joint: Secondary | ICD-10-CM | POA: Diagnosis present

## 2023-10-02 DIAGNOSIS — W19XXXA Unspecified fall, initial encounter: Secondary | ICD-10-CM

## 2023-10-02 DIAGNOSIS — I700 Atherosclerosis of aorta: Secondary | ICD-10-CM | POA: Diagnosis not present

## 2023-10-02 DIAGNOSIS — Z981 Arthrodesis status: Secondary | ICD-10-CM | POA: Diagnosis not present

## 2023-10-02 LAB — CBC WITH DIFFERENTIAL/PLATELET
Abs Immature Granulocytes: 0.02 10*3/uL (ref 0.00–0.07)
Basophils Absolute: 0 10*3/uL (ref 0.0–0.1)
Basophils Relative: 0 %
Eosinophils Absolute: 0 10*3/uL (ref 0.0–0.5)
Eosinophils Relative: 0 %
HCT: 35.3 % — ABNORMAL LOW (ref 36.0–46.0)
Hemoglobin: 12.4 g/dL (ref 12.0–15.0)
Immature Granulocytes: 0 %
Lymphocytes Relative: 6 %
Lymphs Abs: 0.3 10*3/uL — ABNORMAL LOW (ref 0.7–4.0)
MCH: 35.9 pg — ABNORMAL HIGH (ref 26.0–34.0)
MCHC: 35.1 g/dL (ref 30.0–36.0)
MCV: 102.3 fL — ABNORMAL HIGH (ref 80.0–100.0)
Monocytes Absolute: 0.3 10*3/uL (ref 0.1–1.0)
Monocytes Relative: 7 %
Neutro Abs: 4.4 10*3/uL (ref 1.7–7.7)
Neutrophils Relative %: 87 %
Platelets: 215 10*3/uL (ref 150–400)
RBC: 3.45 MIL/uL — ABNORMAL LOW (ref 3.87–5.11)
RDW: 15.8 % — ABNORMAL HIGH (ref 11.5–15.5)
WBC: 5.1 10*3/uL (ref 4.0–10.5)
nRBC: 0 % (ref 0.0–0.2)

## 2023-10-02 LAB — URINALYSIS, W/ REFLEX TO CULTURE (INFECTION SUSPECTED)
Bacteria, UA: NONE SEEN
Bilirubin Urine: NEGATIVE
Glucose, UA: NEGATIVE mg/dL
Ketones, ur: NEGATIVE mg/dL
Nitrite: NEGATIVE
Protein, ur: NEGATIVE mg/dL
Specific Gravity, Urine: 1.012 (ref 1.005–1.030)
pH: 8 (ref 5.0–8.0)

## 2023-10-02 LAB — I-STAT VENOUS BLOOD GAS, ED
Acid-Base Excess: 0 mmol/L (ref 0.0–2.0)
Bicarbonate: 25.4 mmol/L (ref 20.0–28.0)
Calcium, Ion: 1.21 mmol/L (ref 1.15–1.40)
HCT: 36 % (ref 36.0–46.0)
Hemoglobin: 12.2 g/dL (ref 12.0–15.0)
O2 Saturation: 55 %
Potassium: 3.6 mmol/L (ref 3.5–5.1)
Sodium: 123 mmol/L — ABNORMAL LOW (ref 135–145)
TCO2: 27 mmol/L (ref 22–32)
pCO2, Ven: 44 mmHg (ref 44–60)
pH, Ven: 7.37 (ref 7.25–7.43)
pO2, Ven: 30 mmHg — CL (ref 32–45)

## 2023-10-02 LAB — COMPREHENSIVE METABOLIC PANEL WITH GFR
ALT: 25 U/L (ref 0–44)
AST: 77 U/L — ABNORMAL HIGH (ref 15–41)
Albumin: 3.6 g/dL (ref 3.5–5.0)
Alkaline Phosphatase: 25 U/L — ABNORMAL LOW (ref 38–126)
Anion gap: 9 (ref 5–15)
BUN: 11 mg/dL (ref 8–23)
CO2: 26 mmol/L (ref 22–32)
Calcium: 9.4 mg/dL (ref 8.9–10.3)
Chloride: 90 mmol/L — ABNORMAL LOW (ref 98–111)
Creatinine, Ser: 1.05 mg/dL — ABNORMAL HIGH (ref 0.44–1.00)
GFR, Estimated: 53 mL/min — ABNORMAL LOW (ref >60)
Glucose, Bld: 84 mg/dL (ref 70–99)
Potassium: 3.6 mmol/L (ref 3.5–5.1)
Sodium: 125 mmol/L — ABNORMAL LOW (ref 135–145)
Total Bilirubin: 1.3 mg/dL — ABNORMAL HIGH (ref 0.0–1.2)
Total Protein: 6.6 g/dL (ref 6.5–8.1)

## 2023-10-02 LAB — AMMONIA: Ammonia: 19 umol/L (ref 9–35)

## 2023-10-02 LAB — PROTIME-INR
INR: 1.1 (ref 0.8–1.2)
Prothrombin Time: 14.4 s (ref 11.4–15.2)

## 2023-10-02 LAB — I-STAT CG4 LACTIC ACID, ED: Lactic Acid, Venous: 1.9 mmol/L (ref 0.5–1.9)

## 2023-10-02 MED ORDER — VANCOMYCIN HCL IN DEXTROSE 1-5 GM/200ML-% IV SOLN
1000.0000 mg | Freq: Once | INTRAVENOUS | Status: AC
  Administered 2023-10-02: 1000 mg via INTRAVENOUS
  Filled 2023-10-02: qty 200

## 2023-10-02 MED ORDER — SODIUM CHLORIDE 0.9 % IV SOLN
2.0000 g | Freq: Once | INTRAVENOUS | Status: AC
  Administered 2023-10-02: 2 g via INTRAVENOUS
  Filled 2023-10-02: qty 12.5

## 2023-10-02 MED ORDER — SODIUM CHLORIDE 0.9 % IV SOLN: 2.0000 g | Freq: Once | INTRAVENOUS | Status: DC

## 2023-10-02 MED ORDER — METRONIDAZOLE 500 MG/100ML IV SOLN
500.0000 mg | Freq: Once | INTRAVENOUS | Status: AC
  Administered 2023-10-02: 500 mg via INTRAVENOUS
  Filled 2023-10-02: qty 100

## 2023-10-02 MED ORDER — LACTATED RINGERS IV BOLUS (SEPSIS)
500.0000 mL | Freq: Once | INTRAVENOUS | Status: AC
  Administered 2023-10-02: 500 mL via INTRAVENOUS

## 2023-10-02 MED ORDER — LACTATED RINGERS IV SOLN: INTRAVENOUS | Status: DC

## 2023-10-02 MED ORDER — VANCOMYCIN HCL IN DEXTROSE 1-5 GM/200ML-% IV SOLN: 1000.0000 mg | Freq: Once | INTRAVENOUS | Status: DC

## 2023-10-02 MED ORDER — IOHEXOL 350 MG/ML SOLN
75.0000 mL | Freq: Once | INTRAVENOUS | Status: AC | PRN
  Administered 2023-10-02: 75 mL via INTRAVENOUS

## 2023-10-02 MED ORDER — ACETAMINOPHEN 325 MG PO TABS
650.0000 mg | ORAL_TABLET | Freq: Four times a day (QID) | ORAL | Status: DC | PRN
  Administered 2023-10-02: 650 mg via ORAL
  Filled 2023-10-02: qty 2

## 2023-10-02 MED ORDER — LACTATED RINGERS IV BOLUS (SEPSIS)
1000.0000 mL | Freq: Once | INTRAVENOUS | Status: AC
  Administered 2023-10-02: 1000 mL via INTRAVENOUS

## 2023-10-02 MED ORDER — LACTATED RINGERS IV BOLUS (SEPSIS)
250.0000 mL | Freq: Once | INTRAVENOUS | Status: AC
  Administered 2023-10-02: 250 mL via INTRAVENOUS

## 2023-10-02 NOTE — Telephone Encounter (Signed)
 Copied from CRM [id number]. Topic: General - Other >> Oct 02, 2023 10:02 AM Jethro Morrison wrote: Reason for CRM: PT SPOUSE STATED SHE IS HAVING ISSUE WITH HER LEG WILL CALL DERMATOGOLY DOCTOR, THEY WANTED DR BURNS TO KNOW

## 2023-10-02 NOTE — Sepsis Progress Note (Signed)
 eLink is following this Code Sepsis.

## 2023-10-02 NOTE — ED Provider Notes (Signed)
 I saw and evaluated the patient, reviewed the resident's note and I agree with the findings and plan.  EKG Interpretation Date/Time:  Friday Oct 02 2023 17:51:35 EDT Ventricular Rate:  92 PR Interval:  179 QRS Duration:  125 QT Interval:  336 QTC Calculation: 416 R Axis:   59  Text Interpretation: Sinus rhythm Biatrial enlargement Nonspecific intraventricular conduction delay Nonspecific T abnormalities, lateral leads Minimal ST elevation, lateral leads Artifact in lead(s) I II III aVR aVL aVF V1 V2 V3 V4 V5 V6 Confirmed by Lind Repine ([id number]) on 10/02/2023 5:36:110 PM   83 year old female here after unwitnessed fall prior to arrival.  Patient has altered to status here.  Rectal temp is one 2.3.  Right lower extremity shows some erythema and edema.  It is warm to the touch.  She is neurovasc intact at her right foot.  Will start septic workup as well as image patient's lower extremity anticipate admission        Lind Repine, MD 10/02/23 1759

## 2023-10-02 NOTE — ED Provider Notes (Signed)
 [place] EMERGENCY DEPARTMENT AT [facility] Provider Note  History  Chief Complaint:  Fall   Fall   Sherri [name] is a 83 y.o. female with a history of squamous cell carcinoma, basal cell carcinoma, hyperlipidemia who presents to the emergency department for a fall.  Per EMS they state that they found the patient covered in feces and urine.  Per their history they state that the patient was attempting to walk from the bedroom to the bathroom when she experienced a ground-level fall.  She complains of right lower extremity pain.  They were unable to detect pulses.  On arrival patient does have Doppler pulses in the right lower extremity.  She has a normal pulse in the left lower extremity at the [facility].  Husband who provides collateral history states that the patient has had worsening swelling to her right lower extremity over the past 2 days.  Does have a history of squamous cell carcinoma that was removed on this extremity has had difficulty healing.  Past Medical History:  Diagnosis Date   Anxiety    takes Valium  daily as needed   Basal cell carcinoma 01/21/1988   left nostril (MOHS), sup-right calf (CX35FU)   Basal cell carcinoma 03/22/1996   right calf (CX35FU)   Basal cell carcinoma 03/31/1995   left wing nose   Bruises easily    d/t meds   Cancer (HCC)    basal cell ca, in situ- uterine    Cataracts, bilateral    removed bilateral   Chronic back pain    Dizziness    r/t to meds   GERD (gastroesophageal reflux disease)    no meds on a regular basis but will take Tums if needed   Headache(784.0)    r/t neck issues   History of bronchitis 6-67yrs ago   History of colon polyps    benign   HOH (hard of hearing)    wears hearing aids   Hyperlipidemia    takes Atorvastatin  on Mondays and Fridays   Hypertension    Joint pain    Joint swelling    Multiple sclerosis (HCC)    Neuromuscular disorder (HCC)    Dr. Hobson Luna- [facility], follows [facility]    Nocturia    PONV (postoperative nausea and vomiting)    trouble urinating after surgery in 2014   Postoperative nausea and vomiting 01/11/2019   Rheumatoid arthritis (HCC)    Dr Voncille Guadalajara weekly, RA- hands- knees- feet    Rheumatoid arthritis(714.0)    Dr Alvira Josephs takes Xeljanz  daily   Right wrist fracture    Skin cancer    Squamous cell carcinoma of skin 11/02/2001   in situ-right knee (cx74fu)   Squamous cell carcinoma of skin 11/12/2011   in situ-left forearm (CX35FU), in situ-left foot (CX35FU)   Squamous cell carcinoma of skin 01/22/2012   in situ-left lower forearm (txpbx)   Squamous cell carcinoma of skin 11/17/2012   right shin (txpbx)   Squamous cell carcinoma of skin 10/28/2013   in situ-right shoulder (CX35FU)   Squamous cell carcinoma of skin 04/28/2014   well diff-right shin (txpbx)   Squamous cell carcinoma of skin 11/02/2014   in situ-Left shin (txpbx)   Squamous cell carcinoma of skin 12/15/2014   in situ-Left hand (txpbx), bowens-left side chest (txpbx)   Squamous cell carcinoma of skin 05/09/2015   in situ-left hand (txpbx)    Squamous cell carcinoma of skin 05/07/2016   in situ-left inner shin,ant, in  situ-left inner shin, post, in situ-left outer forearm, in situ-right knuckle   Squamous cell carcinoma of skin 03/1302018   in situ-left shoulder (txpbx), in situ-right inner shin (txpbx), in situ-top of left foot (txpbx), KA- left forearm (txpbx)   Squamous cell carcinoma of skin 12/02/2016   in situ-left inner shin (txpbx)   Squamous cell carcinoma of skin 03/04/2017   in situ-left outer sup, shin (txpbx), in situ- right 2nd knuckle finger (txpbx), in situ- Left wrist (txpbx)   Squamous cell carcinoma of skin 04/06/2018   in situ-above left knee inner (txpbx)   Squamous cell carcinoma of skin 04/21/2018   in situ-right top hand (txpbx)   Squamous cell carcinoma of skin 07/08/2018   in situ-right lower inner shin (txpbx)   Squamous cell  carcinoma of skin 04/27/2019   in situ- Left neck(CX35FU), In situ- right neck (CX35FU)   Urinary retention    sees Dr.Wrenn about 2 times a yr    Past Surgical History:  Procedure Laterality Date   ABDOMINAL HYSTERECTOMY     1985   ANTERIOR CERVICAL DECOMP/DISCECTOMY FUSION N/A 04/23/2022   Procedure: Anterior Cervical Decompression/Discectomy Fusion - Cervical Seven-Thoracic One;  Surgeon: Agustina Aldrich, MD;  Location: [facility] OR;  Service: Neurosurgery;  Laterality: N/A;  3C   APPENDECTOMY     with TAH   BACK SURGERY     several   BREAST BIOPSY Right    Results were negative   BROW LIFT Bilateral 10/02/2016   Procedure: BILATERAL LOWER LID BLEPHAROPLASTY;  Surgeon: Karan Osgood, MD;  Location: [facility] OR;  Service: Plastics;  Laterality: Bilateral;   CARPAL TUNNEL RELEASE Right    cataracts     CERVICAL FUSION     Dr Rica Chalet   CERVICAL FUSION  12/31/2012   Dr Rica Chalet   CERVICAL FUSION  04/2022   CHEST TUBE INSERTION     for traumatic Pneumothorax   COLONOSCOPY  07/16/2010   normal    COLONOSCOPY W/ POLYPECTOMY  1997   negative since; Dr Arvie Latus   ESOPHAGOGASTRODUODENOSCOPY  07/16/2010   normal   eye lid raise     EYE SURGERY Bilateral    cataracts removed - /w IOL   HARDWARE REMOVAL  08/2021   JOINT REPLACEMENT     LUMBAR FUSION     Dr Rica Chalet   NASAL SINUS SURGERY     POSTERIOR CERVICAL FUSION/FORAMINOTOMY N/A 12/31/2012   Procedure: POSTERIOR LATERAL CERVICAL FUSION/FORAMINOTOMY LEVEL 1 CERVICAL THREE-FOUR WITH LATERAL MASS SCREWS;  Surgeon: Adelbert Adler, MD;  Location: [facility];  Service: Neurosurgery;  Laterality: N/A;   PTOSIS REPAIR Bilateral 10/02/2016   Procedure: INTERNAL PTOSIS REPAIR;  Surgeon: Karan Osgood, MD;  Location: [facility] OR;  Service: Plastics;  Laterality: Bilateral;   SKIN BIOPSY     THYROID  SURGERY     R lobe removed- 1972, has grown back - CT last done- 2018   TOTAL HIP ARTHROPLASTY  12/22/2011   Procedure: TOTAL HIP ARTHROPLASTY;   Surgeon: Ilean Mall, MD;  Location: [facility] OR;  Service: Orthopedics;  Laterality: Left;   TOTAL SHOULDER ARTHROPLASTY     TUBAL LIGATION     UPPER GASTROINTESTINAL ENDOSCOPY  2012   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   WRIST SURGERY Left 09/16/2022   wrist/arm    Family History  Problem Relation Age of Onset   Cancer Mother        pancreatic   Diabetes Mother    Pancreatic cancer Mother  Heart disease Father        Rheumatic   Cancer Father        ? stomach   Stomach cancer Father    Cancer Sister        stomach   Stomach cancer Sister    Cancer Maternal Aunt        X 4; ? primary   Heart disease Paternal Aunt    Cancer Maternal Grandmother        cervical   Colon cancer Other        Aunts   Multiple sclerosis Daughter    Hypertension Neg Hx    Stroke Neg Hx    Esophageal cancer Neg Hx    Rectal cancer Neg Hx     Social History   Tobacco Use   Smoking status: Former    Current packs/day: 0.00    Average packs/day: 2.5 packs/day for 20.0 years (50.0 ttl pk-yrs)    Types: Cigarettes    Start date: 05/26/1961    Quit date: 05/26/1981    Years since quitting: 42.3    Passive exposure: Never   Smokeless tobacco: Never   Tobacco comments:    Smoked [phone], up to 2.5 ppd  Vaping Use   Vaping status: Never Used  Substance Use Topics   Alcohol  use: Yes    Alcohol /week: 14.0 standard drinks of alcohol     Types: 14 Cans of beer per week    Comment: 2% beer   Drug use: Never    Review of Systems  Review of Systems   Reviewed and documented in HPI if pertinent.   Physical Exam   ED Triage Vitals  Encounter Vitals Group     BP 10/02/23 1730 (!) 161/64     Systolic BP Percentile --      Diastolic BP Percentile --      Pulse Rate 10/02/23 1730 93     Resp 10/02/23 1730 (!) 24     Temp 10/02/23 1724 99.3 F (37.4 C)     Temp Source 10/02/23 1724 Oral     SpO2 10/02/23 1730 97 %     Weight --      Height --      Head Circumference --      Peak Flow --       Pain Score 10/02/23 1730 10     Pain Loc --      Pain Education --      Exclude from Growth Chart --      Physical Exam Vitals and nursing note reviewed.  Constitutional:      General: She is not in acute distress.    Appearance: She is well-developed.  HENT:     Head: Normocephalic and atraumatic.  Eyes:     Conjunctiva/sclera: Conjunctivae normal.  Cardiovascular:     Rate and Rhythm: Normal rate and regular rhythm.     Heart sounds: No murmur heard. Pulmonary:     Effort: Pulmonary effort is normal. No respiratory distress.     Breath sounds: Normal breath sounds.  Abdominal:     Palpations: Abdomen is soft.     Tenderness: There is no abdominal tenderness.  Musculoskeletal:        General: No swelling.     Cervical back: Neck supple.     Comments: Right lower extremity has bruising, erythema to the calf down to the ankle; generalized swelling to the extremity.  Doppler signal identified at the DP area; no palpable pulse  at the PT or DP area, 2+ pulse DP area of the left foot  Skin:    General: Skin is warm and dry.     Capillary Refill: Capillary refill takes less than 2 seconds.  Neurological:     GCS: GCS eye subscore is 4. GCS verbal subscore is 4. GCS motor subscore is 6.     Comments: Oriented to name and location only Difficulty answering questions GCS 14 due to confused answers on verbal questioning   Psychiatric:        Mood and Affect: Mood normal.      Procedures   Procedures  ED Course - Medical Decision Making  Brief Overview Darianna  A Shamis is a 83 y.o. female who presents as per above.  I have reviewed the nursing documentation for past medical history, family history, and social history and agree.  I have reviewed the patient's vital signs. Febrile.  Initial Differential Diagnoses: I am primarily concerned for sepsis, cellulitis, DVT, abscess, altered mental status.  Therapies: These medications and interventions were provided for  the patient while in the ED.  Medications  enoxaparin (LOVENOX) injection 30 mg (has no administration in time range)  cefTRIAXone (ROCEPHIN) 2 g in sodium chloride  0.9 % 100 mL IVPB (has no administration in time range)  acetaminophen  (TYLENOL ) tablet 650 mg (has no administration in time range)    Or  acetaminophen  (TYLENOL ) suppository 650 mg (has no administration in time range)  ondansetron  (ZOFRAN ) tablet 4 mg (has no administration in time range)    Or  ondansetron  (ZOFRAN ) injection 4 mg (has no administration in time range)  senna-docusate (Senokot-S) tablet 1 tablet (has no administration in time range)  bisacodyl  (DULCOLAX) EC tablet 5 mg (has no administration in time range)  vancomycin  (VANCOCIN ) IVPB 1000 mg/200 mL premix (has no administration in time range)  carvedilol  (COREG ) tablet 6.25 mg (has no administration in time range)  DULoxetine  (CYMBALTA ) DR capsule 30 mg (has no administration in time range)  predniSONE  (DELTASONE ) tablet 5 mg (has no administration in time range)  lactated ringers  bolus 1,000 mL (0 mLs Intravenous Stopped 10/02/23 1931)    And  lactated ringers  bolus 500 mL (0 mLs Intravenous Stopped 10/02/23 1931)    And  lactated ringers  bolus 250 mL (0 mLs Intravenous Stopped 10/02/23 1932)  ceFEPIme (MAXIPIME) 2 g in sodium chloride  0.9 % 100 mL IVPB (0 g Intravenous Stopped 10/02/23 1845)  metroNIDAZOLE  (FLAGYL ) IVPB 500 mg (0 mg Intravenous Stopped 10/02/23 1931)  vancomycin  (VANCOCIN ) IVPB 1000 mg/200 mL premix (0 mg Intravenous Stopped 10/02/23 1932)  iohexol  (OMNIPAQUE ) 350 MG/ML injection 75 mL (75 mLs Intravenous Contrast Given 10/02/23 2039)    Testing Results: On my interpretation labs are significant for : No significant leukocytosis Lactic acid reassuring Ammonia reassuring  On my interpretation imaging is significant for: Chest x-ray without pneumonia CT lower extremity reveals subcutaneous edema without evidence of abscess  EKG  Interpretation Date/Time:  Friday Oct 02 2023 17:51:35 EDT Ventricular Rate:  92 PR Interval:  179 QRS Duration:  125 QT Interval:  336 QTC Calculation: 416 R Axis:   59  Text Interpretation: Sinus rhythm Biatrial enlargement Nonspecific intraventricular conduction delay Nonspecific T abnormalities, lateral leads Minimal ST elevation, lateral leads Artifact in lead(s) I II III aVR aVL aVF V1 V2 V3 V4 V5 V6 Confirmed by Lind Repine ([id number]) on 10/02/2023 5:58:28 PM   See the EMR for full details regarding lab and imaging results.  Medical Decision Making 83 year old  female who presents the emergency department after reported fall.  On exam patient is noted to have a swollen erythematous right lower extremity.  She does have dopplerable signals.  The extremity is warm.  Low concern for ischemic limb at this time given the appearance as well as the temperature.  However is concerning for infection.  Patient was febrile rectally here in the emergency department.  She is also altered from a verbal standpoint.  She is able to wiggle toes on the right lower extremity.  She has intact sensation.  There is concerns for sepsis.  Patient was given 30 mL/kg bolus.  Patient was started on broad-spectrum antibiotics.  Lactate is reassuring.  Patient does not have leukocytosis.  I suspect the source to be the skin of the right lower extremity.  However given concern for abscess CT of the lower extremity was performed.  This reveals subcutaneous edema.  No concern for necrotizing soft tissue infection.  CT head was reassuring.  There are findings on the CT for a possible DVT.  Formal DVT ultrasound is still pending. Hospitalist is aware.  I do feel the patient requires admission to the hospital for ongoing infection management and blood culture monitoring.  I discussed this with the hospitalist and they will admit the patient to their service.  Problems Addressed: Pain and swelling of right lower extremity:  acute illness or injury that poses a threat to life or bodily functions  Amount and/or Complexity of Data Reviewed Labs: ordered. Radiology: ordered.  Risk Prescription drug management. Decision regarding hospitalization.     ### All radiography studies, electrocardiograms, and laboratory data were personally reviewed by me and incorporated into my medical decision making. Impression   1. Pain and swelling of right lower extremity      Note: Dragon medical dictation software was used in the creation of this note.     Arminda Landmark, MD 10/03/23 Jaynie Meyers    Lind Repine, MD 10/05/23 [phone]

## 2023-10-02 NOTE — ED Notes (Signed)
 Floor notified that patient is on the way upstairs.

## 2023-10-02 NOTE — H&P (Signed)
 History and Physical    Sherri  A Sherald [name] WUJ:[id number] DOB: 1941-02-08 DOA: 10/02/2023  PCP: Colene Dauphin, MD  Patient coming from: Home  I have personally briefly reviewed patient's old medical records in [facility]  Chief Complaint: Fall at home, right leg swelling and redness  HPI: Sherri [name] is a 83 y.o. female with medical history significant for multiple sclerosis in remission, rheumatoid arthritis (on chronic [name] , methotrexate , prednisone  5 mg daily), HTN, HLD, chronic pain, anxiety who presented to the ED from home for evaluation of right leg swelling and erythema after a fall at home.  Patient has an open wound to her right lateral lower leg.  Per chart review looks like she has been following with dermatology as an outpatient and on last visit 5/2 wound appeared to be significantly improved.  Reportedly she had a squamous cell carcinoma lesion removed from the right leg which has had difficulty healing.  Patient notes that she has had significantly increased erythema and some swelling involving her right lower leg over the last 2 days.  She says last night at home she lost her balance and fell when trying to walk to the bathroom from her bedroom.  She says she was stuck on the ground for about 3-4 hours.  EMS were eventually called and per ED documentation patient was found covered in feces and urine.  She was brought to the ED for further evaluation.  ED Course  Labs/Imaging on admission: I have personally reviewed following labs and imaging studies.  Initial vitals showed BP 161/64, pulse 93, RR 24, temp 99.3 F, SpO2 97% on room air.  Tmax 101.3 F  Labs show WBC 5.1, hemoglobin 12.4, platelets 215, sodium 125, potassium 3.6, bicarb 26, BUN 11, creatinine 1.05, serum glucose 84, AST 77, ALT 25, alk phos 25, total bilirubin 1.3, lactic acid 1.9, ammonia 19.  Blood cultures in process.  Portable chest x-ray negative for focal consolidation, edema,  effusion.  CT head without contrast negative for acute intracranial abnormality.  Extensive chronic microvascular ischemic changes and mild parenchymal volume loss noted.  CT right lower extremity with contrast showed indeterminate popliteal vein focal dilatation with no definite filling defect.  Similar finding to lesser extent of the mid femoral vein also noted.  Severe atherosclerotic plaque with limited evaluation due to nonangiography phase.  Diffuse leg, ankle, foot subcutaneous soft tissue edema without subcutaneous soft tissue emphysema.  No acute fracture or dislocation.  Patient was given 1.75 L LR, IV vancomycin , cefepime, Flagyl .  The hospitalist service was consulted to admit.  Review of Systems: All systems reviewed and are negative except as documented in history of present illness above.   Past Medical History:  Diagnosis Date   Anxiety    takes Valium  daily as needed   Basal cell carcinoma 01/21/1988   left nostril (MOHS), sup-right calf (CX35FU)   Basal cell carcinoma 03/22/1996   right calf (CX35FU)   Basal cell carcinoma 03/31/1995   left wing nose   Bruises easily    d/t meds   Cancer (HCC)    basal cell ca, in situ- uterine    Cataracts, bilateral    removed bilateral   Chronic back pain    Dizziness    r/t to meds   GERD (gastroesophageal reflux disease)    no meds on a regular basis but will take Tums if needed   Headache(784.0)    r/t neck issues   History of bronchitis 6-38yrs ago  History of colon polyps    benign   HOH (hard of hearing)    wears hearing aids   Hyperlipidemia    takes Atorvastatin  on Mondays and Fridays   Hypertension    Joint pain    Joint swelling    Multiple sclerosis (HCC)    Neuromuscular disorder (HCC)    Dr. Hobson Luna- [facility], follows [facility]   Nocturia    PONV (postoperative nausea and vomiting)    trouble urinating after surgery in 2014   Postoperative nausea and vomiting 01/11/2019   Rheumatoid arthritis  (HCC)    Dr Voncille Guadalajara weekly, RA- hands- knees- feet    Rheumatoid arthritis(714.0)    Dr Alvira Josephs takes Xeljanz  daily   Right wrist fracture    Skin cancer    Squamous cell carcinoma of skin 11/02/2001   in situ-right knee (cx2fu)   Squamous cell carcinoma of skin 11/12/2011   in situ-left forearm (CX35FU), in situ-left foot (CX35FU)   Squamous cell carcinoma of skin 01/22/2012   in situ-left lower forearm (txpbx)   Squamous cell carcinoma of skin 11/17/2012   right shin (txpbx)   Squamous cell carcinoma of skin 10/28/2013   in situ-right shoulder (CX35FU)   Squamous cell carcinoma of skin 04/28/2014   well diff-right shin (txpbx)   Squamous cell carcinoma of skin 11/02/2014   in situ-Left shin (txpbx)   Squamous cell carcinoma of skin 12/15/2014   in situ-Left hand (txpbx), bowens-left side chest (txpbx)   Squamous cell carcinoma of skin 05/09/2015   in situ-left hand (txpbx)    Squamous cell carcinoma of skin 05/07/2016   in situ-left inner shin,ant, in situ-left inner shin, post, in situ-left outer forearm, in situ-right knuckle   Squamous cell carcinoma of skin 03/1302018   in situ-left shoulder (txpbx), in situ-right inner shin (txpbx), in situ-top of left foot (txpbx), KA- left forearm (txpbx)   Squamous cell carcinoma of skin 12/02/2016   in situ-left inner shin (txpbx)   Squamous cell carcinoma of skin 03/04/2017   in situ-left outer sup, shin (txpbx), in situ- right 2nd knuckle finger (txpbx), in situ- Left wrist (txpbx)   Squamous cell carcinoma of skin 04/06/2018   in situ-above left knee inner (txpbx)   Squamous cell carcinoma of skin 04/21/2018   in situ-right top hand (txpbx)   Squamous cell carcinoma of skin 07/08/2018   in situ-right lower inner shin (txpbx)   Squamous cell carcinoma of skin 04/27/2019   in situ- Left neck(CX35FU), In situ- right neck (CX35FU)   Urinary retention    sees Dr.Wrenn about 2 times a yr    Past Surgical History:   Procedure Laterality Date   ABDOMINAL HYSTERECTOMY     1985   ANTERIOR CERVICAL DECOMP/DISCECTOMY FUSION N/A 04/23/2022   Procedure: Anterior Cervical Decompression/Discectomy Fusion - Cervical Seven-Thoracic One;  Surgeon: Agustina Aldrich, MD;  Location: [facility] OR;  Service: Neurosurgery;  Laterality: N/A;  3C   APPENDECTOMY     with TAH   BACK SURGERY     several   BREAST BIOPSY Right    Results were negative   BROW LIFT Bilateral 10/02/2016   Procedure: BILATERAL LOWER LID BLEPHAROPLASTY;  Surgeon: Karan Osgood, MD;  Location: [facility] OR;  Service: Plastics;  Laterality: Bilateral;   CARPAL TUNNEL RELEASE Right    cataracts     CERVICAL FUSION     Dr Rica Chalet   CERVICAL FUSION  12/31/2012   Dr Rica Chalet   CERVICAL FUSION  04/2022  CHEST TUBE INSERTION     for traumatic Pneumothorax   COLONOSCOPY  07/16/2010   normal    COLONOSCOPY W/ POLYPECTOMY  1997   negative since; Dr Arvie Latus   ESOPHAGOGASTRODUODENOSCOPY  07/16/2010   normal   eye lid raise     EYE SURGERY Bilateral    cataracts removed - /w IOL   HARDWARE REMOVAL  08/2021   JOINT REPLACEMENT     LUMBAR FUSION     Dr Rica Chalet   NASAL SINUS SURGERY     POSTERIOR CERVICAL FUSION/FORAMINOTOMY N/A 12/31/2012   Procedure: POSTERIOR LATERAL CERVICAL FUSION/FORAMINOTOMY LEVEL 1 CERVICAL THREE-FOUR WITH LATERAL MASS SCREWS;  Surgeon: Adelbert Adler, MD;  Location: [facility];  Service: Neurosurgery;  Laterality: N/A;   PTOSIS REPAIR Bilateral 10/02/2016   Procedure: INTERNAL PTOSIS REPAIR;  Surgeon: Karan Osgood, MD;  Location: [facility] OR;  Service: Plastics;  Laterality: Bilateral;   SKIN BIOPSY     THYROID  SURGERY     R lobe removed- 1972, has grown back - CT last done- 2018   TOTAL HIP ARTHROPLASTY  12/22/2011   Procedure: TOTAL HIP ARTHROPLASTY;  Surgeon: Ilean Mall, MD;  Location: [facility] OR;  Service: Orthopedics;  Laterality: Left;   TOTAL SHOULDER ARTHROPLASTY     TUBAL LIGATION     UPPER GASTROINTESTINAL ENDOSCOPY  2012    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   WRIST SURGERY Left 09/16/2022   wrist/arm    Social History: Social History   Tobacco Use   Smoking status: Former    Current packs/day: 0.00    Average packs/day: 2.5 packs/day for 20.0 years (50.0 ttl pk-yrs)    Types: Cigarettes    Start date: 05/26/1961    Quit date: 05/26/1981    Years since quitting: 42.3    Passive exposure: Never   Smokeless tobacco: Never   Tobacco comments:    Smoked [phone], up to 2.5 ppd  Vaping Use   Vaping status: Never Used  Substance Use Topics   Alcohol  use: Yes    Alcohol /week: 14.0 standard drinks of alcohol     Types: 14 Cans of beer per week    Comment: 2% beer   Drug use: Never    Allergies  Allergen Reactions   Darvon [Propoxyphene Hcl] Shortness Of Breath    ? Dose Related ? Lowered respirations greatly   Demerol [Meperidine] Shortness Of Breath and Other (See Comments)    Respiratory Distress   Penicillins Hives and Other (See Comments)    Has patient had a PCN reaction causing immediate rash, facial/tongue/throat swelling, SOB or lightheadedness with hypotension: No SEVERE RASH INVOLVING MUCUS MEMBRANES or SKIN NECROSIS: #  #  #  YES  #  #  #  Has patient had a PCN reaction that required hospitalization No Has patient had a PCN reaction occurring within the last 10 years: No.    Imuran [Azathioprine] Other (See Comments)    Weakness   Sulfa  Antibiotics Nausea Only and Other (See Comments)    States had severe indigestion and heartburn and nausea and had to stop taking   Arava [Leflunomide] Other (See Comments)    Excessive weight gain   Lipitor [Atorvastatin ] Nausea And Vomiting    Currently taking 2 times weekly - able to tolerate low dose    Vytorin [Ezetimibe-Simvastatin] Nausea And Vomiting    Family History  Problem Relation Age of Onset   Cancer Mother        pancreatic  Diabetes Mother    Pancreatic cancer Mother    Heart disease Father        Rheumatic   Cancer  Father        ? stomach   Stomach cancer Father    Cancer Sister        stomach   Stomach cancer Sister    Cancer Maternal Aunt        X 4; ? primary   Heart disease Paternal Aunt    Cancer Maternal Grandmother        cervical   Colon cancer Other        Aunts   Multiple sclerosis Daughter    Hypertension Neg Hx    Stroke Neg Hx    Esophageal cancer Neg Hx    Rectal cancer Neg Hx      Prior to Admission medications   Medication Sig Start Date End Date Taking? Authorizing Provider  atorvastatin  (LIPITOR) 10 MG tablet TAKE 1 TAB ON MONDAYS AND FRIDAY 02/26/23   Colene Dauphin, MD  Biotin  5000 MCG TABS Take 10,000 mcg by mouth every evening.    [provider]  Carboxymethylcellul-Glycerin  (LUBRICATING EYE DROPS OP) Place 1 drop into both eyes daily as needed (dry eyes).    [provider]  carvedilol  (COREG ) 6.25 MG tablet Take 1 tablet (6.25 mg total) by mouth 2 (two) times daily with a meal. 07/27/23   Burns, Beckey Bourgeois, MD  cholecalciferol  (VITAMIN D ) 25 MCG (1000 UNIT) tablet Take 1,000 Units by mouth in the morning.    [provider]  cyclobenzaprine  (FLEXERIL ) 10 MG tablet TAKE 1 TABLET BY MOUTH THREE TIMES A DAY AS NEEDED FOR MUSCLE SPASM 06/11/23   Colene Dauphin, MD  denosumab  (PROLIA ) 60 MG/ML SOSY injection Inject 60 mg into the skin every 6 (six) months. Deliver to rheum: [address], [place], [place] [address]. Appt on 07/23/2022 07/17/22 07/17/23  Romayne Clubs, PA-C  diazepam  (VALIUM ) 5 MG tablet TAKE 1 TABLET BY MOUTH AT BEDTIME AS NEEDED FOR MUSCLE SPASMS. 04/28/23   Colene Dauphin, MD  diclofenac sodium (VOLTAREN) 1 % GEL APPLY 3 GRAMS TO 3 LARGE JOINTS 3 TIMES A DAY AS NEEDED 01/27/18   Nicholas Bari, MD  DULoxetine  (CYMBALTA ) 30 MG capsule TAKE 1 CAPSULE BY MOUTH EVERY DAY 06/04/23   Smith, Zachary M, DO  folic acid  (FOLVITE ) 1 MG tablet Take 1 tablet (1 mg total) by mouth daily. 09/02/23   Romayne Clubs, PA-C  furosemide  (LASIX ) 20 MG  tablet TAKE 1 TABLET BY MOUTH EVERY DAY 08/19/23   Colene Dauphin, MD  methotrexate  (RHEUMATREX) 2.5 MG tablet Take 5 tablets (12.5 mg total) by mouth once a week. Caution:Chemotherapy. Protect from light. 09/02/23   Romayne Clubs, PA-C  pantoprazole  (PROTONIX ) 40 MG tablet Take 1 tablet (40 mg total) by mouth 2 (two) times daily before a meal. 07/27/23   Burns, Beckey Bourgeois, MD  predniSONE  (DELTASONE ) 5 MG tablet TAKE 1 TABLET BY MOUTH EVERY DAY IN THE MORNING 05/22/23   Romayne Clubs, PA-C  triamcinolone  cream (KENALOG ) 0.1 % Apply topically. 08/03/23   [provider]  Upadacitinib  ER ([name] ) 15 MG TB24 Take 15 mg by mouth daily.    [provider]    Physical Exam: Vitals:   10/02/23 2115 10/02/23 2230 10/02/23 2321 10/03/23 0028  BP:  138/66  (!) 122/56  Pulse: (!) 103 98  84  Resp: 18 (!) 23  16  Temp: (!) 101.3 F (38.5 C)  99.8 F (37.7 C) 99.6 F (37.6 C)  TempSrc:    Oral  SpO2: 92% 94%  95%  Weight:      Height:       Constitutional: Resting supine in bed, NAD, calm, comfortable Eyes: EOMI, lids and conjunctivae normal ENMT: Mucous membranes are moist. Posterior pharynx clear of any exudate or lesions.Normal dentition.  Neck: normal, supple, no masses. Respiratory: clear to auscultation bilaterally, no wheezing, no crackles. Normal respiratory effort. No accessory muscle use.  Cardiovascular: Regular rate and rhythm, no murmurs / rubs / gallops. No pitting edema. 2+ pedal pulses. Abdomen: no tenderness, no masses palpated. Musculoskeletal: no clubbing / cyanosis. No joint deformity upper and lower extremities. Good ROM, no contractures. Normal muscle tone.  Skin: Wound to right lateral mid shin area with some purulent discharge as pictured below with erythema and bruising from ankle up to almost the knee area.   Neurologic: Sensation intact. Strength equal bilaterally. Psychiatric: Alert and oriented to self, place, situation but not year.      EKG:  Personally reviewed. Sinus rhythm, rate 92, motion artifact throughout.  Assessment/Plan Principal Problem:   Sepsis due to cellulitis [facility]) Active Problems:   Hyponatremia   Cellulitis of right lower extremity   Multiple sclerosis (HCC)   Essential hypertension   Rheumatoid arthritis (HCC)   Fall at home, initial encounter   Sherri [name] is a 83 y.o. female with medical history significant for multiple sclerosis in remission, rheumatoid arthritis (on chronic [name] , methotrexate , prednisone  5 mg daily), HTN, HLD, chronic pain, anxiety who is admitted with cellulitis of the right lower extremity and hyponatremia.  Assessment and Plan: Sepsis due to cellulitis of right lower extremity: Has an open wound to the right lateral lower leg which was being followed by her physicians on an outpatient basis now with cellulitis involving leg from ankle almost up to the knee.  Had fever, tachycardia, and tachypnea on arrival.  CT RLE did show some indeterminate changes involving the popliteal and mid femoral veins which could potentially be due to DVT. - Continue IV vancomycin  and ceftriaxone - Consult to wound care - Obtain RLE DVT study  Hyponatremia: Sodium 125 on admission.  Patient has received nearly 3 L total of fluids at time of admission.  Will DC current fluid orders, hold Cymbalta .  Repeat BMP.  Acute metabolic encephalopathy: Patient was a mild confusion on admission.  Suspect related to sepsis and less likely symptomatic hyponatremia.  CT head was negative for acute changes.  Continue management as above.  Fall at home: History limited from patient but she believes she fell after losing balance.  No obvious injury.  CT head and RLE negative for traumatic injury. - Check CK level - PT/OT eval - Fall precautions  Mild LFT elevation: In setting of sepsis.  No abdominal tenderness on exam.  Repeat labs in AM.  Rheumatoid arthritis: Follows with rheumatology Dr. Orelia Binet.   On chronic prednisone  5 mg daily which will be continued.  Holding [name]  and methotrexate .  Hypertension: BP is stable.  Resume home Coreg  6.25 mg twice daily in AM.  Multiple sclerosis: Follows with neurology Dr. Janetta Medina and her MS has been stable off of DMT since 2019.  Chronic back pain/radiculopathy: Cymbalta  on hold given hyponatremia.  Hyperlipidemia: Holding atorvastatin  pending CK level.   DVT prophylaxis: enoxaparin (LOVENOX) injection 30 mg Start: 10/03/23 1000 Code Status: Full code Family Communication: Discussed with patient,  she has discussed with family Disposition Plan: From home, dispo pending clinical progress Consults called: None Severity of Illness: The appropriate patient status for this patient is OBSERVATION. Observation status is judged to be reasonable and necessary in order to provide the required intensity of service to ensure the patient's safety. The patient's presenting symptoms, physical exam findings, and initial radiographic and laboratory data in the context of their medical condition is felt to place them at decreased risk for further clinical deterioration. Furthermore, it is anticipated that the patient will be medically stable for discharge from the hospital within 2 midnights of admission.   Edith Gores MD [facility]  If 7PM-7AM, please contact night-coverage [email or web address]  10/03/2023, 1:09 AM

## 2023-10-02 NOTE — Telephone Encounter (Signed)
 Noted.

## 2023-10-02 NOTE — ED Notes (Signed)
 Pt moved from room 19. Re connected to the monitor. Denies needs.

## 2023-10-02 NOTE — ED Triage Notes (Signed)
 Pt arrives via EMS from home with unwitnessed fall, unable to recall how she fell. Pt with intermittent confusion per EMS. Noted to answer questions appropriately. Pt with tight bandage on arrival to RLE, doppler pulse positive. Brusing to right elbow. VSS in transport. Pt covered in feces on arrival.

## 2023-10-02 NOTE — Hospital Course (Signed)
 Sherri [name] is a 83 y.o. female with medical history significant for multiple sclerosis in remission, rheumatoid arthritis (on chronic [name] , methotrexate , prednisone  5 mg daily), HTN, HLD, chronic pain, anxiety who is admitted with cellulitis of the right lower extremity and hyponatremia.

## 2023-10-03 ENCOUNTER — Encounter (HOSPITAL_COMMUNITY)

## 2023-10-03 DIAGNOSIS — G8929 Other chronic pain: Secondary | ICD-10-CM | POA: Diagnosis present

## 2023-10-03 DIAGNOSIS — S42401A Unspecified fracture of lower end of right humerus, initial encounter for closed fracture: Secondary | ICD-10-CM | POA: Diagnosis not present

## 2023-10-03 DIAGNOSIS — M1990 Unspecified osteoarthritis, unspecified site: Secondary | ICD-10-CM | POA: Diagnosis not present

## 2023-10-03 DIAGNOSIS — Y92009 Unspecified place in unspecified non-institutional (private) residence as the place of occurrence of the external cause: Secondary | ICD-10-CM | POA: Diagnosis not present

## 2023-10-03 DIAGNOSIS — R278 Other lack of coordination: Secondary | ICD-10-CM | POA: Diagnosis not present

## 2023-10-03 DIAGNOSIS — Z800 Family history of malignant neoplasm of digestive organs: Secondary | ICD-10-CM | POA: Diagnosis not present

## 2023-10-03 DIAGNOSIS — Z8249 Family history of ischemic heart disease and other diseases of the circulatory system: Secondary | ICD-10-CM | POA: Diagnosis not present

## 2023-10-03 DIAGNOSIS — M6281 Muscle weakness (generalized): Secondary | ICD-10-CM | POA: Diagnosis not present

## 2023-10-03 DIAGNOSIS — W010XXA Fall on same level from slipping, tripping and stumbling without subsequent striking against object, initial encounter: Secondary | ICD-10-CM | POA: Diagnosis present

## 2023-10-03 DIAGNOSIS — E861 Hypovolemia: Secondary | ICD-10-CM | POA: Diagnosis present

## 2023-10-03 DIAGNOSIS — Z85828 Personal history of other malignant neoplasm of skin: Secondary | ICD-10-CM | POA: Diagnosis not present

## 2023-10-03 DIAGNOSIS — R1312 Dysphagia, oropharyngeal phase: Secondary | ICD-10-CM | POA: Diagnosis not present

## 2023-10-03 DIAGNOSIS — G35 Multiple sclerosis: Secondary | ICD-10-CM | POA: Diagnosis not present

## 2023-10-03 DIAGNOSIS — L0390 Cellulitis, unspecified: Secondary | ICD-10-CM | POA: Diagnosis present

## 2023-10-03 DIAGNOSIS — R531 Weakness: Secondary | ICD-10-CM | POA: Diagnosis not present

## 2023-10-03 DIAGNOSIS — L89309 Pressure ulcer of unspecified buttock, unspecified stage: Secondary | ICD-10-CM | POA: Insufficient documentation

## 2023-10-03 DIAGNOSIS — S52021A Displaced fracture of olecranon process without intraarticular extension of right ulna, initial encounter for closed fracture: Secondary | ICD-10-CM | POA: Diagnosis not present

## 2023-10-03 DIAGNOSIS — I10 Essential (primary) hypertension: Secondary | ICD-10-CM | POA: Diagnosis not present

## 2023-10-03 DIAGNOSIS — M810 Age-related osteoporosis without current pathological fracture: Secondary | ICD-10-CM | POA: Diagnosis not present

## 2023-10-03 DIAGNOSIS — R6520 Severe sepsis without septic shock: Secondary | ICD-10-CM | POA: Diagnosis present

## 2023-10-03 DIAGNOSIS — G9341 Metabolic encephalopathy: Secondary | ICD-10-CM | POA: Diagnosis not present

## 2023-10-03 DIAGNOSIS — K219 Gastro-esophageal reflux disease without esophagitis: Secondary | ICD-10-CM | POA: Diagnosis not present

## 2023-10-03 DIAGNOSIS — M0579 Rheumatoid arthritis with rheumatoid factor of multiple sites without organ or systems involvement: Secondary | ICD-10-CM | POA: Diagnosis not present

## 2023-10-03 DIAGNOSIS — E876 Hypokalemia: Secondary | ICD-10-CM | POA: Diagnosis not present

## 2023-10-03 DIAGNOSIS — R262 Difficulty in walking, not elsewhere classified: Secondary | ICD-10-CM | POA: Diagnosis not present

## 2023-10-03 DIAGNOSIS — Q782 Osteopetrosis: Secondary | ICD-10-CM | POA: Diagnosis not present

## 2023-10-03 DIAGNOSIS — Z87891 Personal history of nicotine dependence: Secondary | ICD-10-CM | POA: Diagnosis not present

## 2023-10-03 DIAGNOSIS — Z820 Family history of epilepsy and other diseases of the nervous system: Secondary | ICD-10-CM | POA: Diagnosis not present

## 2023-10-03 DIAGNOSIS — E871 Hypo-osmolality and hyponatremia: Secondary | ICD-10-CM | POA: Diagnosis not present

## 2023-10-03 DIAGNOSIS — S52031A Displaced fracture of olecranon process with intraarticular extension of right ulna, initial encounter for closed fracture: Secondary | ICD-10-CM | POA: Diagnosis not present

## 2023-10-03 DIAGNOSIS — Z885 Allergy status to narcotic agent status: Secondary | ICD-10-CM | POA: Diagnosis not present

## 2023-10-03 DIAGNOSIS — Z7401 Bed confinement status: Secondary | ICD-10-CM | POA: Diagnosis not present

## 2023-10-03 DIAGNOSIS — M069 Rheumatoid arthritis, unspecified: Secondary | ICD-10-CM | POA: Diagnosis not present

## 2023-10-03 DIAGNOSIS — Z7952 Long term (current) use of systemic steroids: Secondary | ICD-10-CM | POA: Diagnosis not present

## 2023-10-03 DIAGNOSIS — G2581 Restless legs syndrome: Secondary | ICD-10-CM | POA: Diagnosis not present

## 2023-10-03 DIAGNOSIS — I872 Venous insufficiency (chronic) (peripheral): Secondary | ICD-10-CM | POA: Diagnosis present

## 2023-10-03 DIAGNOSIS — L03115 Cellulitis of right lower limb: Secondary | ICD-10-CM | POA: Diagnosis not present

## 2023-10-03 DIAGNOSIS — A419 Sepsis, unspecified organism: Secondary | ICD-10-CM | POA: Diagnosis present

## 2023-10-03 DIAGNOSIS — M7989 Other specified soft tissue disorders: Secondary | ICD-10-CM | POA: Diagnosis not present

## 2023-10-03 DIAGNOSIS — M79604 Pain in right leg: Secondary | ICD-10-CM | POA: Diagnosis not present

## 2023-10-03 DIAGNOSIS — E785 Hyperlipidemia, unspecified: Secondary | ICD-10-CM | POA: Diagnosis not present

## 2023-10-03 DIAGNOSIS — M158 Other polyosteoarthritis: Secondary | ICD-10-CM | POA: Diagnosis not present

## 2023-10-03 DIAGNOSIS — M6282 Rhabdomyolysis: Secondary | ICD-10-CM | POA: Diagnosis present

## 2023-10-03 DIAGNOSIS — Z833 Family history of diabetes mellitus: Secondary | ICD-10-CM | POA: Diagnosis not present

## 2023-10-03 DIAGNOSIS — G894 Chronic pain syndrome: Secondary | ICD-10-CM | POA: Diagnosis not present

## 2023-10-03 DIAGNOSIS — L8990 Pressure ulcer of unspecified site, unspecified stage: Secondary | ICD-10-CM | POA: Insufficient documentation

## 2023-10-03 DIAGNOSIS — Z8507 Personal history of malignant neoplasm of pancreas: Secondary | ICD-10-CM | POA: Diagnosis not present

## 2023-10-03 LAB — CK: Total CK: 1999 U/L — ABNORMAL HIGH (ref 38–234)

## 2023-10-03 MED ORDER — SENNOSIDES-DOCUSATE SODIUM 8.6-50 MG PO TABS: 1.0000 | ORAL_TABLET | Freq: Every evening | ORAL | Status: DC | PRN

## 2023-10-03 MED ORDER — ENOXAPARIN SODIUM 30 MG/0.3ML IJ SOSY
30.0000 mg | PREFILLED_SYRINGE | INTRAMUSCULAR | Status: DC
  Administered 2023-10-03 – 2023-10-04 (×2): 30 mg via SUBCUTANEOUS
  Filled 2023-10-03 (×2): qty 0.3

## 2023-10-03 MED ORDER — VITAMIN D 25 MCG (1000 UNIT) PO TABS
1000.0000 [IU] | ORAL_TABLET | Freq: Every morning | ORAL | Status: DC
  Administered 2023-10-03 – 2023-10-09 (×6): 1000 [IU] via ORAL
  Filled 2023-10-03 (×7): qty 1

## 2023-10-03 MED ORDER — OXYCODONE HCL 5 MG PO TABS
5.0000 mg | ORAL_TABLET | ORAL | Status: DC | PRN
  Administered 2023-10-03: 5 mg via ORAL
  Administered 2023-10-03 – 2023-10-06 (×7): 10 mg via ORAL
  Filled 2023-10-03: qty 1
  Filled 2023-10-03 (×7): qty 2

## 2023-10-03 MED ORDER — BIOTIN 5000 MCG PO TABS
10000.0000 ug | ORAL_TABLET | Freq: Every evening | ORAL | Status: DC
Start: 2023-10-03 — End: 2023-10-03

## 2023-10-03 MED ORDER — DIAZEPAM 5 MG PO TABS: 5.0000 mg | ORAL_TABLET | Freq: Every evening | ORAL | Status: DC | PRN

## 2023-10-03 MED ORDER — PANTOPRAZOLE SODIUM 40 MG PO TBEC
40.0000 mg | DELAYED_RELEASE_TABLET | Freq: Two times a day (BID) | ORAL | Status: DC
  Administered 2023-10-03 – 2023-10-09 (×11): 40 mg via ORAL
  Filled 2023-10-03 (×12): qty 1

## 2023-10-03 MED ORDER — FOLIC ACID 1 MG PO TABS
1.0000 mg | ORAL_TABLET | Freq: Every day | ORAL | Status: DC
  Administered 2023-10-03 – 2023-10-09 (×7): 1 mg via ORAL
  Filled 2023-10-03 (×7): qty 1

## 2023-10-03 MED ORDER — BISACODYL 5 MG PO TBEC
5.0000 mg | DELAYED_RELEASE_TABLET | Freq: Every day | ORAL | Status: DC | PRN
  Administered 2023-10-07: 5 mg via ORAL
  Filled 2023-10-03: qty 1

## 2023-10-03 MED ORDER — ONDANSETRON HCL 4 MG PO TABS: 4.0000 mg | ORAL_TABLET | Freq: Four times a day (QID) | ORAL | Status: DC | PRN

## 2023-10-03 MED ORDER — CYCLOBENZAPRINE HCL 10 MG PO TABS
10.0000 mg | ORAL_TABLET | Freq: Three times a day (TID) | ORAL | Status: DC | PRN
  Administered 2023-10-05 – 2023-10-08 (×2): 10 mg via ORAL
  Filled 2023-10-03 (×2): qty 1

## 2023-10-03 MED ORDER — SODIUM CHLORIDE 0.9 % IV SOLN: INTRAVENOUS | Status: DC

## 2023-10-03 MED ORDER — DULOXETINE HCL 30 MG PO CPEP: 30.0000 mg | ORAL_CAPSULE | Freq: Every day | ORAL | Status: DC

## 2023-10-03 MED ORDER — HYDROCODONE-ACETAMINOPHEN 5-325 MG PO TABS
1.0000 | ORAL_TABLET | Freq: Four times a day (QID) | ORAL | Status: DC | PRN
  Administered 2023-10-03 – 2023-10-06 (×2): 1 via ORAL
  Filled 2023-10-03 (×2): qty 1

## 2023-10-03 MED ORDER — ONDANSETRON HCL 4 MG/2ML IJ SOLN: 4.0000 mg | Freq: Four times a day (QID) | INTRAMUSCULAR | Status: DC | PRN

## 2023-10-03 MED ORDER — CARVEDILOL 6.25 MG PO TABS
6.2500 mg | ORAL_TABLET | Freq: Two times a day (BID) | ORAL | Status: DC
  Administered 2023-10-03: 6.25 mg via ORAL
  Filled 2023-10-03: qty 1

## 2023-10-03 MED ORDER — SODIUM CHLORIDE 0.9 % IV SOLN
2.0000 g | INTRAVENOUS | Status: DC
  Administered 2023-10-03 – 2023-10-06 (×4): 2 g via INTRAVENOUS
  Filled 2023-10-03 (×4): qty 20

## 2023-10-03 MED ORDER — ACETAMINOPHEN 650 MG RE SUPP: 650.0000 mg | Freq: Four times a day (QID) | RECTAL | Status: DC | PRN

## 2023-10-03 MED ORDER — VANCOMYCIN HCL IN DEXTROSE 1-5 GM/200ML-% IV SOLN: 1000.0000 mg | INTRAVENOUS | Status: DC

## 2023-10-03 MED ORDER — ACETAMINOPHEN 325 MG PO TABS
650.0000 mg | ORAL_TABLET | Freq: Four times a day (QID) | ORAL | Status: DC | PRN
  Administered 2023-10-03: 650 mg via ORAL
  Filled 2023-10-03: qty 2

## 2023-10-03 MED ORDER — PREDNISONE 10 MG PO TABS
5.0000 mg | ORAL_TABLET | Freq: Every day | ORAL | Status: DC
  Administered 2023-10-03 – 2023-10-09 (×6): 5 mg via ORAL
  Filled 2023-10-03 (×7): qty 1

## 2023-10-03 MED ORDER — CHLORHEXIDINE GLUCONATE CLOTH 2 % EX PADS
6.0000 | MEDICATED_PAD | Freq: Every day | CUTANEOUS | Status: DC
  Administered 2023-10-03 – 2023-10-08 (×6): 6 via TOPICAL

## 2023-10-03 NOTE — Evaluation (Signed)
 Physical Therapy Evaluation Patient Details Name: Sherri [name] MRN: [id number] DOB: 1941/02/21 Today's Date: 10/03/2023  History of Present Illness  83 y.o. female admitted 10/02/23 after fall at home where she was on ground for 3-4hrs with RLE edema. Pt with cellulitis and metabolic encephalopathy. PMhx: multiple sclerosis in remission, RA, HTN, HLD, chronic pain, anxiety, skin CA, HOH, cervical CA, Lt THA, TSA, cervical fusion  Clinical Impression  Pt pleasant with decreased awareness, recall and problem solving. Pt with decreased strength, transfers, gait and mobility who will benefit from acute therapy to maximize mobility, safety and independence. Spouse unable to significantly help pt and with current functional deficits ptt will benefit from continued inpatient follow up therapy, <3 hours/day.        If plan is discharge home, recommend the following: A little help with walking and/or transfers;A lot of help with bathing/dressing/bathroom;Assist for transportation;Assistance with cooking/housework;Supervision due to cognitive status   Can travel by private vehicle   Yes    Equipment Recommendations None recommended by PT  Recommendations for Other Services       Functional Status Assessment Patient has had a recent decline in their functional status and demonstrates the ability to make significant improvements in function in a reasonable and predictable amount of time.     Precautions / Restrictions Precautions Precautions: Fall Recall of Precautions/Restrictions: Impaired      Mobility  Bed Mobility Overal bed mobility: Needs Assistance Bed Mobility: Supine to Sit     Supine to sit: Supervision, HOB elevated, Used rails     General bed mobility comments: HOB 35 degrees with use of rail, increased time and effort to clear legs off surface    Transfers Overall transfer level: Needs assistance   Transfers: Sit to/from Stand Sit to Stand: Contact guard assist,  Min assist           General transfer comment: CGA to rise from bed with cues for hand placement, additional stand from chair after gait with min assist to rise fully max cues for hand placement this trial as pt kept grabbing RW    Ambulation/Gait Ambulation/Gait assistance: Min assist Gait Distance (Feet): 30 Feet Assistive device: Rolling walker (2 wheels) Gait Pattern/deviations: Step-through pattern, Decreased stride length, Trunk flexed   Gait velocity interpretation: <1.8 ft/sec, indicate of risk for recurrent falls   General Gait Details: Pt with decreased stride with progressive increase in trunk flexion with fatigue during gait. pt needs cues to recognize fatigue and need to limit gait distance. mod assist for last few feet to chair  Stairs            Wheelchair Mobility     Tilt Bed    Modified Rankin (Stroke Patients Only)       Balance Overall balance assessment: Needs assistance Sitting-balance support: Feet supported, Bilateral upper extremity supported Sitting balance-Leahy Scale: Fair Sitting balance - Comments: EOb without support   Standing balance support: Bilateral upper extremity supported, Reliant on assistive device for balance, During functional activity Standing balance-Leahy Scale: Poor Standing balance comment: Rw in standing                             Pertinent Vitals/Pain Pain Assessment Pain Assessment: 0-10 Pain Score: 5  Pain Location: RLE Pain Descriptors / Indicators: Aching, Sore Pain Intervention(s): Limited activity within patient's tolerance, Repositioned, Monitored during session    Home Living Family/patient expects to be discharged to:: Private residence Living  Arrangements: Spouse/significant other Available Help at Discharge: Family;Available PRN/intermittently Type of Home: House Home Access: Ramped entrance Entrance Stairs-Rails: Right Entrance Stairs-Number of Steps: 4 Alternate Level  Stairs-Number of Steps: flight Home Layout: Two level;Able to live on main level with bedroom/bathroom Home Equipment: Rollator (4 wheels);BSC/3in1;Shower seat - built in;Grab bars - tub/shower;Wheelchair - manual;Hospital bed Additional Comments: from last report they have a call alert  button and lives with husband who falls a lot    Prior Function Prior Level of Function : Independent/Modified Independent                     Extremity/Trunk Assessment   Upper Extremity Assessment Upper Extremity Assessment: Defer to OT evaluation    Lower Extremity Assessment Lower Extremity Assessment: Generalized weakness    Cervical / Trunk Assessment Cervical / Trunk Assessment: Kyphotic  Communication   Communication Communication: Impaired Factors Affecting Communication: Hearing impaired    Cognition Arousal: Alert Behavior During Therapy: Flat affect   PT - Cognitive impairments: Memory, Orientation, Awareness, Problem solving   Orientation impairments: Time                   PT - Cognition Comments: pt with decreased awareness of deficits and safety, bending over RW with fatigue despite max cues, decreased problem solving Following commands: Impaired Following commands impaired: Follows one step commands inconsistently     Cueing Cueing Techniques: Verbal cues, Gestural cues, Tactile cues     General Comments      Exercises     Assessment/Plan    PT Assessment Patient needs continued PT services  PT Problem List Decreased strength;Decreased coordination;Decreased cognition;Decreased activity tolerance;Decreased balance;Decreased mobility;Decreased knowledge of precautions;Decreased safety awareness;Decreased knowledge of use of DME       PT Treatment Interventions Gait training;DME instruction;Balance training;Functional mobility training;Therapeutic activities;Therapeutic exercise;Patient/family education;Cognitive remediation    PT Goals (Current  goals can be found in the Care Plan section)  Acute Rehab PT Goals Patient Stated Goal: return home PT Goal Formulation: With patient Time For Goal Achievement: 10/17/23 Potential to Achieve Goals: Good    Frequency Min 2X/week     Co-evaluation               AM-PAC PT "6 Clicks" Mobility  Outcome Measure Help needed turning from your back to your side while in a flat bed without using bedrails?: A Little Help needed moving from lying on your back to sitting on the side of a flat bed without using bedrails?: A Little Help needed moving to and from a bed to a chair (including a wheelchair)?: A Little Help needed standing up from a chair using your arms (e.g., wheelchair or bedside chair)?: A Little Help needed to walk in hospital room?: A Lot Help needed climbing 3-5 steps with a railing? : Total 6 Click Score: 15    End of Session Equipment Utilized During Treatment: Gait belt Activity Tolerance: Patient tolerated treatment well Patient left: in chair;with call bell/phone within reach;with chair alarm set Nurse Communication: Mobility status PT Visit Diagnosis: Other abnormalities of gait and mobility (R26.89);Muscle weakness (generalized) (M62.81)    Time: 9562-1308 PT Time Calculation (min) (ACUTE ONLY): 22 min   Charges:   PT Evaluation $PT Eval Moderate Complexity: 1 Mod   PT General Charges $$ ACUTE PT VISIT: 1 Visit         Annis Baseman, PT Acute Rehabilitation Services Office: [phone]   Jackey Mary Jessejames Steelman 10/03/2023, 12:56 PM

## 2023-10-03 NOTE — Evaluation (Signed)
 Occupational Therapy Evaluation Patient Details Name: Sherri [name]  TENZIN DARRINGTON MRN: [id number] DOB: 10/16/1940 Today's Date: 10/03/2023   History of Present Illness   83 y.o. female admitted 10/02/23 after fall at home where she was on ground for 3-4hrs with RLE edema. Pt with cellulitis and metabolic encephalopathy. PMhx: multiple sclerosis in remission, RA, HTN, HLD, chronic pain, anxiety, skin CA, HOH, cervical CA, Lt THA, TSA, cervical fusion     Clinical Impressions Pt in session took increase in time to arouse at the start of session. Pt noted when giving home set up it matched what was in the chart but then noted had to be redirected to questions asked. At this time she was limited to pain with RLE and especially when WB into RLE with ADLS. She required min assist with LE ADLS and CGA for UE while sitting at EOB. She was able to take about 8 steps and then required to sit down due to pain and then return back to bed. At this time recommendation for continued inpatient follow up therapy, <3 hours/day as husband can not assist pt.       If plan is discharge home, recommend the following:   A lot of help with walking and/or transfers;A lot of help with bathing/dressing/bathroom;Assistance with cooking/housework;Assist for transportation;Help with stairs or ramp for entrance     Functional Status Assessment   Patient has had a recent decline in their functional status and demonstrates the ability to make significant improvements in function in a reasonable and predictable amount of time.     Equipment Recommendations   Other (comment) (TBD at next level of care)     Recommendations for Other Services         Precautions/Restrictions   Precautions Precautions: Fall Recall of Precautions/Restrictions: Impaired Restrictions Weight Bearing Restrictions Per Provider Order: No     Mobility Bed Mobility Overal bed mobility: Needs Assistance Bed Mobility: Supine to Sit, Sit to  Supine     Supine to sit: Contact guard, Min assist Sit to supine: Contact guard assist        Transfers Overall transfer level: Needs assistance Equipment used: Rolling walker (2 wheels) Transfers: Sit to/from Stand Sit to Stand: Contact guard assist, Min assist           General transfer comment: Pt needs cues on hand position      Balance Overall balance assessment: Needs assistance Sitting-balance support: Feet supported, Bilateral upper extremity supported Sitting balance-Leahy Scale: Fair     Standing balance support: Bilateral upper extremity supported Standing balance-Leahy Scale: Fair Standing balance comment: Pt has difficulties with WB through RLE                           ADL either performed or assessed with clinical judgement   ADL Overall ADL's : Needs assistance/impaired Eating/Feeding: Set up;Sitting   Grooming: Wash/dry face;Set up;Sitting   Upper Body Bathing: Contact guard assist;Sitting   Lower Body Bathing: Minimal assistance;Sit to/from stand;Sitting/lateral leans Lower Body Bathing Details (indicate cue type and reason): limited reach to RLE Upper Body Dressing : Contact guard assist;Sitting   Lower Body Dressing: Minimal assistance;Cueing for safety;Cueing for sequencing   Toilet Transfer: Minimal assistance;Cueing for safety;Cueing for sequencing;Rolling walker (2 wheels)   Toileting- Clothing Manipulation and Hygiene: Minimal assistance;Cueing for safety;Cueing for sequencing;Sit to/from stand       Functional mobility during ADLs: Minimal assistance;Rolling walker (2 wheels);Cueing for sequencing;Cueing for safety  Vision Baseline Vision/History: 0 No visual deficits Ability to See in Adequate Light: 0 Adequate Patient Visual Report: No change from baseline Vision Assessment?: No apparent visual deficits     Perception         Praxis         Pertinent Vitals/Pain Pain Assessment Pain Assessment:  Faces Faces Pain Scale: Hurts even more Pain Location: RLE Pain Descriptors / Indicators: Discomfort Pain Intervention(s): Limited activity within patient's tolerance, Repositioned     Extremity/Trunk Assessment Upper Extremity Assessment Upper Extremity Assessment: Overall WFL for tasks assessed   Lower Extremity Assessment Lower Extremity Assessment: Defer to PT evaluation   Cervical / Trunk Assessment Cervical / Trunk Assessment: Kyphotic   Communication Communication Communication: No apparent difficulties Factors Affecting Communication: Hearing impaired   Cognition Arousal: Lethargic Behavior During Therapy: Flat affect Cognition: Cognition impaired, No family/caregiver present to determine baseline   Orientation impairments: Time, Place, Person Awareness: Intellectual awareness impaired, Online awareness impaired Memory impairment (select all impairments): Short-term memory, Working memory Attention impairment (select first level of impairment): Alternating attention Executive functioning impairment (select all impairments): Initiation, Organization, Sequencing, Reasoning, Problem solving OT - Cognition Comments: Pt noted to fall asleep at the begning and end of session and alter on level of orientation in session.                 Following commands: Impaired Following commands impaired: Follows one step commands inconsistently     Cueing  General Comments   Cueing Techniques: Verbal cues      Exercises     Shoulder Instructions      Home Living Family/patient expects to be discharged to:: Private residence Living Arrangements: Spouse/significant other Available Help at Discharge: Family Type of Home: House Home Access: Stairs to enter;Ramped entrance Entrance Stairs-Number of Steps: 4 Entrance Stairs-Rails: Right Home Layout: Two level;Able to live on main level with bedroom/bathroom Alternate Level Stairs-Number of Steps: flight   Bathroom  Shower/Tub: Producer, television/film/video: Handicapped height Bathroom Accessibility: Yes How Accessible: Accessible via walker Home Equipment: Agricultural consultant (2 wheels);Rollator (4 wheels);BSC/3in1;Shower seat - built in;Grab bars - tub/shower;Wheelchair - manual;Hospital bed   Additional Comments: from last report they have a call alert  button and lives with husband who falls a lot      Prior Functioning/Environment Prior Level of Function : Independent/Modified Independent                    OT Problem List: Decreased strength;Decreased activity tolerance;Impaired balance (sitting and/or standing);Decreased knowledge of use of DME or AE;Pain   OT Treatment/Interventions: Self-care/ADL training;Therapeutic exercise;Therapeutic activities;Patient/family education;Balance training      OT Goals(Current goals can be found in the care plan section)   Acute Rehab OT Goals Patient Stated Goal: none OT Goal Formulation: With patient Time For Goal Achievement: 10/17/23 Potential to Achieve Goals: Good   OT Frequency:  Min 2X/week    Co-evaluation              AM-PAC OT "6 Clicks" Daily Activity     Outcome Measure Help from another person eating meals?: None Help from another person taking care of personal grooming?: A Little Help from another person toileting, which includes using toliet, bedpan, or urinal?: A Little Help from another person bathing (including washing, rinsing, drying)?: A Little Help from another person to put on and taking off regular upper body clothing?: A Little Help from another person to put on and taking off  regular lower body clothing?: A Little 6 Click Score: 19   End of Session Equipment Utilized During Treatment: Gait belt;Rolling walker (2 wheels) Nurse Communication: Mobility status  Activity Tolerance: Patient limited by fatigue Patient left: in bed;with call bell/phone within reach;with bed alarm set  OT Visit Diagnosis:  Unsteadiness on feet (R26.81);Other abnormalities of gait and mobility (R26.89);Repeated falls (R29.6);Muscle weakness (generalized) (M62.81);Pain Pain - Right/Left: Right Pain - part of body: Leg                Time: 1610-9604 OT Time Calculation (min): 25 min Charges:  OT General Charges $OT Visit: 1 Visit OT Evaluation $OT Eval Low Complexity: 1 Low OT Treatments $Self Care/Home Management : 8-22 mins  Erving Heather OTR/L  Acute Rehab Services  [phone] office number   Stevphen Elders 10/03/2023, 12:00 PM

## 2023-10-03 NOTE — Consult Note (Signed)
 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  Apply vaseline gauze ([place] [phone]) to wound bed daily, cover with ABD pad and secure with Coban or ACE bandage.    Patient should resume follow-up with dermatology as outpatient for ongoing management of this wound.   POC discussed with bedside nurse. WOC team will not follow. Re-consult if further needs arise.   Thank you,    Ronni Colace MSN, RN-BC, [employer] [phone]

## 2023-10-03 NOTE — Care Management Obs Status (Signed)
 MEDICARE OBSERVATION STATUS NOTIFICATION   Patient Details  Name: Sherri  IVOREE [name] MRN: [id number] Date of Birth: 1940-07-07   Medicare Observation Status Notification Given:  Yes    Leonce Bale G., RN 10/03/2023, 9:14 AM

## 2023-10-03 NOTE — Plan of Care (Signed)

## 2023-10-03 NOTE — Progress Notes (Signed)
 Sherri [name]  A Sherri [name]  QQV:[id number] DOB: [birth year] DOA: 10/02/2023 PCP: Colene Dauphin, MD    Brief Narrative:  83 year old with a history of MS in remission, RA on chronic prednisone  MTX and [name] , HTN, HLD, chronic pain, and anxiety who presented to the ER from home with 2 days of right leg swelling and erythema after a mechanical fall at home.  She has an unrelated persistent open wound of her right lower extremity following a squamous cell carcinoma excision for which she has been seeing a dermatologist in the outpatient setting.  Notes on 5/2 suggest the wound was improving.  In the ER CT head was without acute findings but did note extensive chronic microvascular change.  CT right lower extremity showed an indeterminant popliteal vein focal dilation with no clear filling defect with similar findings of the mid femoral vein.  There was however no evidence of fracture or subcutaneous emphysema or abscess.  Goals of Care:   Code Status: Full Code   DVT prophylaxis: enoxaparin (LOVENOX) injection 30 mg Start: 10/03/23 1000   Interim Hx: Afebrile this morning, with Tmax of 101.3 since admission.  Vital signs stable.  No longer tachycardic.  Oxygen saturation 90-95% on room air.  Mildly confused at the time of my visit but alert and pleasant.  Denies any new complaints.  Appears to be resting comfortably.  Assessment & Plan:  Cellulitis of the right lower extremity Point of entry appears to be slow healing wound related to excision of squamous cell carcinoma -continue broad-spectrum empiric antibiotics and wound care -monitor clinically  Sepsis POA due to cellulitis Patient was febrile with tachycardia and tachypnea with a clear source of bacterial infection on presentation -she was treated with volume resuscitation and empiric broad-spectrum antibiotics -sepsis physiology has now resolved  Possible DVT of the right lower extremity Indeterminate findings on CT could be suggestive of DVT of  the right lower extremity - venous duplex pending  Metabolic encephalopathy due to acute bacterial infection with sepsis Mild confusion noted at time of presentation -CT head without acute findings -complete metabolic workup  Mild hyponatremia Likely simply due to hypovolemia - monitor trend - SSRI on hold  Recent mechanical fall at home No evidence of significant injury on ER evaluation - PT/OT to see  Mild LFT elevations Likely simply due to sepsis -monitor trend  Rheumatoid arthritis Followed by rheumatology in the outpatient setting - continue chronic prednisone  but hold biologic and methotrexate  for now  HTN Blood pressure stable at present  MS Followed by Neurology in outpatient setting - has been off DMT since 2019 and stable  Chronic low back pain with radiculopathy Stable at present  HLD Continue usual atorvastatin  dose   Family Communication: No family present at time of exam Disposition: Will depend upon performance with PT/OT   Objective: Blood pressure (!) 108/59, pulse 89, temperature 98.5 F (36.9 C), temperature source Oral, resp. rate 19, height 5\' 1"  (1.549 m), weight 54.4 kg, SpO2 92%.  Intake/Output Summary (Last 24 hours) at 10/03/2023 0747 Last data filed at 10/03/2023 3329 Gross per 24 hour  Intake 3819.53 ml  Output 3750 ml  Net 69.53 ml   Filed Weights   10/02/23 1822  Weight: 54.4 kg    Examination: General: No acute respiratory distress Lungs: Clear to auscultation bilaterally without wheezes or crackles Cardiovascular: Regular rate and rhythm without murmur gallop or rub normal S1 and S2 Abdomen: Nontender, nondistended, soft, bowel sounds positive, no rebound, no ascites, no appreciable mass  Extremities: Right lower extremity dressed at present with no significant edema of the foot -left lower extremity without cyanosis clubbing or edema  CBC: Recent Labs  Lab 10/02/23 1748 10/02/23 1801  WBC 5.1  --   NEUTROABS 4.4  --   HGB  12.4 12.2  HCT 35.3* 36.0  MCV 102.3*  --   PLT 215  --    Basic Metabolic Panel: Recent Labs  Lab 10/02/23 1748 10/02/23 1801  NA 125* 123*  K 3.6 3.6  CL 90*  --   CO2 26  --   GLUCOSE 84  --   BUN 11  --   CREATININE 1.05*  --   CALCIUM  9.4  --    GFR: Estimated Creatinine Clearance: 31.7 mL/min (A) (by C-G formula based on SCr of 1.05 mg/dL (H)).   Scheduled Meds:  carvedilol   6.25 mg Oral BID WC   enoxaparin (LOVENOX) injection  30 mg Subcutaneous Q24H   predniSONE   5 mg Oral Q breakfast   Continuous Infusions:  cefTRIAXone (ROCEPHIN)  IV 2 g (10/03/23 0534)   [START ON 10/04/2023] vancomycin        LOS: 0 days   Sherri Abate, MD [facility] Office  [phone] Pager - Text Page per Tilford Foley  If 7PM-7AM, please contact night-coverage per Amion 10/03/2023, 7:47 AM

## 2023-10-03 NOTE — Progress Notes (Addendum)
 Pharmacy Antibiotic Note  Sherri [name] is a 83 y.o. female admitted on 10/02/2023 with fall and concerns for sepsis due to R. Leg cellulitis.  Pharmacy has been consulted for vancomycin  dosing.  -sCr 1.05 (bl~0.9-1) -Blood cultures collected  Plan: -Ceftriaxone 2g IV every 24 hours -Vancomycin  load given in ED  -Vancomycin  1000mg  IV every 48 hours (AUC 415, Vd 0.72, IBW, sCr 1.05) -Monitor renal function -Follow up signs of clinical improvement, LOT, de-escalation of antibiotics   Height: 5\' 1"  (154.9 cm) Weight: 54.4 kg (120 lb) IBW/kg (Calculated) : 47.8  Temp (24hrs), Avg:100.2 F (37.9 C), Min:99.3 F (37.4 C), Max:101.3 F (38.5 C)  Recent Labs  Lab 10/02/23 1748 10/02/23 1801  WBC 5.1  --   CREATININE 1.05*  --   LATICACIDVEN  --  1.9    Estimated Creatinine Clearance: 31.7 mL/min (A) (by C-G formula based on SCr of 1.05 mg/dL (H)).     Antimicrobials this admission: Ceftriaxone 5/10 >>  Vancomycin  5/9 >>   Microbiology results: 5/9 BCx:  Thank you for allowing pharmacy to be a part of this patient's care.  Young Hensen, PharmD, BCPS Clinical Pharmacist 10/03/2023 12:21 AM

## 2023-10-04 DIAGNOSIS — A419 Sepsis, unspecified organism: Secondary | ICD-10-CM | POA: Diagnosis not present

## 2023-10-04 DIAGNOSIS — L0390 Cellulitis, unspecified: Secondary | ICD-10-CM | POA: Diagnosis not present

## 2023-10-04 LAB — FERRITIN: Ferritin: 207 ng/mL (ref 11–307)

## 2023-10-04 LAB — CBC
HCT: 30 % — ABNORMAL LOW (ref 36.0–46.0)
Hemoglobin: 10.5 g/dL — ABNORMAL LOW (ref 12.0–15.0)
MCH: 36.1 pg — ABNORMAL HIGH (ref 26.0–34.0)
MCHC: 35 g/dL (ref 30.0–36.0)
MCV: 103.1 fL — ABNORMAL HIGH (ref 80.0–100.0)
Platelets: 190 10*3/uL (ref 150–400)
RBC: 2.91 MIL/uL — ABNORMAL LOW (ref 3.87–5.11)
RDW: 16.1 % — ABNORMAL HIGH (ref 11.5–15.5)
WBC: 7.1 10*3/uL (ref 4.0–10.5)
nRBC: 0 % (ref 0.0–0.2)

## 2023-10-04 LAB — COMPREHENSIVE METABOLIC PANEL WITH GFR
ALT: 40 U/L (ref 0–44)
AST: 110 U/L — ABNORMAL HIGH (ref 15–41)
Albumin: 2.8 g/dL — ABNORMAL LOW (ref 3.5–5.0)
Alkaline Phosphatase: 31 U/L — ABNORMAL LOW (ref 38–126)
Anion gap: 13 (ref 5–15)
BUN: 9 mg/dL (ref 8–23)
CO2: 19 mmol/L — ABNORMAL LOW (ref 22–32)
Calcium: 8.5 mg/dL — ABNORMAL LOW (ref 8.9–10.3)
Chloride: 97 mmol/L — ABNORMAL LOW (ref 98–111)
Creatinine, Ser: 0.75 mg/dL (ref 0.44–1.00)
GFR, Estimated: >60 mL/min (ref >60)
Glucose, Bld: 77 mg/dL (ref 70–99)
Potassium: 3.6 mmol/L (ref 3.5–5.1)
Sodium: 129 mmol/L — ABNORMAL LOW (ref 135–145)
Total Bilirubin: 0.8 mg/dL (ref 0.0–1.2)
Total Protein: 5.7 g/dL — ABNORMAL LOW (ref 6.5–8.1)

## 2023-10-04 LAB — MRSA NEXT GEN BY PCR, NASAL: MRSA by PCR Next Gen: NOT DETECTED

## 2023-10-04 LAB — FOLATE
Folate: 13.3 ng/mL (ref >5.9)
Folate: 25.2 ng/mL (ref >5.9)

## 2023-10-04 LAB — RETICULOCYTES
Immature Retic Fract: 21.8 % — ABNORMAL HIGH (ref 2.3–15.9)
RBC.: 2.9 MIL/uL — ABNORMAL LOW (ref 3.87–5.11)
Retic Count, Absolute: 64.1 10*3/uL (ref 19.0–186.0)
Retic Ct Pct: 2.2 % (ref 0.4–3.1)

## 2023-10-04 LAB — IRON AND TIBC
Iron: 33 ug/dL (ref 28–170)
Saturation Ratios: 13 % (ref 10.4–31.8)
TIBC: 256 ug/dL (ref 250–450)
UIBC: 223 ug/dL

## 2023-10-04 LAB — RPR: RPR Ser Ql: NONREACTIVE

## 2023-10-04 LAB — VITAMIN B12: Vitamin B-12: 1774 pg/mL — ABNORMAL HIGH (ref 180–914)

## 2023-10-04 MED ORDER — VANCOMYCIN HCL 750 MG/150ML IV SOLN
750.0000 mg | INTRAVENOUS | Status: DC
  Filled 2023-10-04: qty 150

## 2023-10-04 MED ORDER — ENOXAPARIN SODIUM 60 MG/0.6ML IJ SOSY
60.0000 mg | PREFILLED_SYRINGE | Freq: Two times a day (BID) | INTRAMUSCULAR | Status: DC
  Administered 2023-10-04 – 2023-10-05 (×2): 60 mg via SUBCUTANEOUS
  Filled 2023-10-04 (×2): qty 0.6

## 2023-10-04 NOTE — Plan of Care (Signed)
  Problem: Coping: Goal: Level of anxiety will decrease Outcome: Progressing   Problem: Elimination: Goal: Will not experience complications related to urinary retention Outcome: Progressing   Problem: Pain Managment: Goal: General experience of comfort will improve and/or be controlled Outcome: Progressing   Problem: Safety: Goal: Ability to remain free from injury will improve Outcome: Progressing

## 2023-10-04 NOTE — Progress Notes (Signed)
 Pharmacy Antibiotic Note  Sherri  [name] is a 83 y.o. female admitted on 10/02/2023 with fall and concerns for sepsis due to R. Leg cellulitis.  Pharmacy has been consulted for vancomycin  dosing.  Renal function has improved, Scr now 0.75 -- will adjust vancomycin  dose accordingly.   Plan: -Ceftriaxone 2g IV every 24 hours -Vancomycin  750 mg IV every 24 hours (AUC 497, Vd 0.72, IBW, sCr 0.8) -Monitor renal function -Follow up signs of clinical improvement, LOT, de-escalation of antibiotics   Height: 5\' 1"  (154.9 cm) Weight: 54.4 kg (120 lb) IBW/kg (Calculated) : 47.8  Temp (24hrs), Avg:98.4 F (36.9 C), Min:97.9 F (36.6 C), Max:98.9 F (37.2 C)  Recent Labs  Lab 10/02/23 1748 10/02/23 1801 10/04/23 0759  WBC 5.1  --  7.1  CREATININE 1.05*  --  0.75  LATICACIDVEN  --  1.9  --     Estimated Creatinine Clearance: 41.6 mL/min (by C-G formula based on SCr of 0.75 mg/dL).     Antimicrobials this admission: Ceftriaxone 5/10 >>  Vancomycin  5/9 >>   Microbiology results: 5/9 BCx: ngtd  Thank you for allowing pharmacy to be a part of this patient's care.  Juleen Oakland, PharmD PGY1 Pharmacy Resident 10/04/2023 9:48 AM

## 2023-10-04 NOTE — Progress Notes (Signed)
 Sherri  [name]  ZOX:[id number] DOB: 24-Sep-1940 DOA: 10/02/2023 PCP: Colene Dauphin, MD    Brief Narrative:  83 year old with a history of MS in remission, RA on chronic prednisone  MTX and [name] , HTN, HLD, chronic pain, and anxiety who presented to the ER from home with 2 days of right leg swelling and erythema after a mechanical fall at home.  She has an unrelated persistent open wound of her right lower extremity following a squamous cell carcinoma excision for which she has been seeing a Dermatologist in the outpatient setting.  Notes on 5/2 suggest the wound was improving.  In the ER CT head was without acute findings but did note extensive chronic microvascular change.  CT right lower extremity showed an indeterminant popliteal vein focal dilation with no clear filling defect with similar findings of the mid femoral vein.  There was however no evidence of fracture or subcutaneous emphysema or abscess.  Goals of Care:   Code Status: Full Code   DVT prophylaxis: enoxaparin (LOVENOX) injection 30 mg Start: 10/03/23 1000   Interim Hx: No acute events reported overnight.  Afebrile.  Vital signs stable.  Venous duplex ordered but still not yet done.  The patient notes ongoing erythema and swelling of the left foot and lower extremity.  She denies chest pain or shortness of breath.  She does report a nagging dry cough that she states has been present for the last few days.  She is more alert and conversant today.  Assessment & Plan:  Cellulitis of the right lower extremity Point of entry appears to be slow healing wound related to excision of squamous cell carcinoma - continue broad-spectrum empiric antibiotics and wound care -discontinue vancomycin  as MRSA screen was negative  Sepsis POA due to cellulitis Patient was febrile with tachycardia and tachypnea with a clear source of bacterial infection on presentation -she was treated with volume resuscitation and empiric broad-spectrum antibiotics  -sepsis physiology has now resolved  Possible DVT of the right lower extremity Indeterminate findings on CT could be suggestive of DVT of the right lower extremity - venous duplex still not yet completed   Metabolic encephalopathy due to acute bacterial infection with sepsis Mild confusion noted at time of presentation - CT head without acute findings -B12 is not low -RPR nonreactive - folate normal  Mild hyponatremia Likely simply due to hypovolemia - SSRI on hold -sodium improving with volume expansion  Recent mechanical fall at home - deconditioning No evidence of significant injury on ER evaluation - PT/OT are suggesting a SNF rehab stay as she had displayed decreased awareness recall and problem-solving with decreased strength transfers gait and mobility with no assistance at home  Mild LFT elevations Likely simply due to sepsis - monitor trend  Rheumatoid arthritis Followed by Rheumatology in the outpatient setting - continue chronic prednisone  but hold biologic and methotrexate  for now in setting of acute infection  HTN Blood pressure well-controlled  MS Followed by Neurology in outpatient setting - has been off DMT since 2019 and stable  Chronic low back pain with radiculopathy Stable at present  HLD Resume atorvastatin  once oral intake improved   Family Communication: No family present at time of exam Disposition: Will need SNF rehab stay -awaiting clinical improvement in lower extremity edema and performance of venous duplex before patient cleared for discharge   Objective: Blood pressure 132/63, pulse 87, temperature 97.9 F (36.6 C), temperature source Oral, resp. rate 18, height 5\' 1"  (1.549 m), weight 54.4 kg, SpO2  96%.  Intake/Output Summary (Last 24 hours) at 10/04/2023 0820 Last data filed at 10/03/2023 2015 Gross per 24 hour  Intake 100 ml  Output 1950 ml  Net -1850 ml   Filed Weights   10/02/23 1822  Weight: 54.4 kg    Examination: General: No  acute respiratory distress Lungs: Clear to auscultation bilaterally  Cardiovascular: Regular rate and rhythm without murmur  Abdomen: Nontender, nondistended, soft, bowel sounds positive, no rebound Extremities: Right foot with persistent mild generalized erythema and more pronounced erythema about the ankle -dressing applied to lower extremity which I chose not to disturb today -left lower extremity without acute finding -2+ right lower extremity edema with only trace edema to the left lower extremity  CBC: Recent Labs  Lab 10/02/23 1748 10/02/23 1801  WBC 5.1  --   NEUTROABS 4.4  --   HGB 12.4 12.2  HCT 35.3* 36.0  MCV 102.3*  --   PLT 215  --    Basic Metabolic Panel: Recent Labs  Lab 10/02/23 1748 10/02/23 1801  NA 125* 123*  K 3.6 3.6  CL 90*  --   CO2 26  --   GLUCOSE 84  --   BUN 11  --   CREATININE 1.05*  --   CALCIUM  9.4  --    GFR: Estimated Creatinine Clearance: 31.7 mL/min (A) (by C-G formula based on SCr of 1.05 mg/dL (H)).   Scheduled Meds:  Chlorhexidine  Gluconate Cloth  6 each Topical Daily   cholecalciferol   1,000 Units Oral q AM   enoxaparin (LOVENOX) injection  30 mg Subcutaneous Q24H   folic acid   1 mg Oral Daily   pantoprazole   40 mg Oral BID AC   predniSONE   5 mg Oral Q breakfast   Continuous Infusions:  sodium chloride  60 mL/hr at 10/04/23 0328   cefTRIAXone (ROCEPHIN)  IV 2 g (10/04/23 0529)   vancomycin        LOS: 1 day   Abbe Abate, MD [facility] Office  [phone] Pager - Text Page per Amion  If 7PM-7AM, please contact night-coverage per Amion 10/04/2023, 8:20 AM

## 2023-10-04 NOTE — TOC Initial Note (Signed)
 Transition of Care [facility]) - Initial/Assessment Note    Patient Details  Name: Sherri [name] MRN: [id number] Date of Birth: [birth year]  Transition of Care [facility] CM/SW Contact:    Hilda Lovings, LCSW Phone Number: 10/04/2023, 10:27 AM  Clinical Narrative:                 CSW followed-up disposition recommendations (SNF placement).  CSW completed initial TOC work-up/assessment as noted by the following below.   CSW spoke with the patient to review SNF referral process per clinical recommendations and assessed the pt's SNF preference.   The patient expressed no SNF preference.   The CSW attempted to contact the patient's natural support (Daughter: Chryl Crank [phone] ) to update them to the patient's disposition. CSW was unable to make contact with the natural support. CSW   CSW updated the bedside nurse and additional clinical members o the information above regarding SNF initial efforts for SNF placement.    CSW referral efforts to support the patient's disposition FL2:  PASRR:  SNF referrals:   TOC Disposition follow-up needs  Please provide the patient or natural support with bed-offer updates.  Please continue with SNF placement efforts. Please update the clinical team to SNF placement efforts:  No other needs identified by this Clinical research associate currently. Patient needs and current disposition to be followed by    Expected Discharge Plan: Skilled Nursing Facility Barriers to Discharge: Continued Medical Work up   Patient Goals and CMS Choice Patient states their goals for this hospitalization and ongoing recovery are:: To get better   Choice offered to / list presented to : Adult Children      Expected Discharge Plan and Services In-house Referral: Clinical Social Work   Post Acute Care Choice: Skilled Nursing Facility Living arrangements for the past 2 months: Single Family Home                                      Prior Living  Arrangements/Services Living arrangements for the past 2 months: Single Family Home Lives with:: Self Patient language and need for interpreter reviewed:: No Do you feel safe going back to the place where you live?: Yes      Need for Family Participation in Patient Care: Yes (Comment) Care giver support system in place?: Yes (comment)      Activities of Daily Living   ADL Screening (condition at time of admission) Independently performs ADLs?: No Is the patient deaf or have difficulty hearing?: No  Permission Sought/Granted Permission sought to share information with : Case Manager, Magazine features editor, Family Supports Permission granted to share information with : Yes, Verbal Permission Granted  Share Information with NAME: Manley,Kimberly     Permission granted to share info w Relationship: Daughter  Permission granted to share info w Contact Information: [phone]  Emotional Assessment       Orientation: : Oriented to Self, Oriented to Place, Oriented to  Time, Oriented to Situation Alcohol  / Substance Use: Not Applicable Psych Involvement: No (comment)  Admission diagnosis:  Cellulitis of right lower extremity [L03.115] Pain and swelling of right lower extremity [M57.846, M79.89] Cellulitis [L03.90] Patient Active Problem List   Diagnosis Date Noted   Sepsis due to cellulitis (HCC) 10/03/2023   Cellulitis 10/03/2023   Pressure injury of skin 10/03/2023   Hyponatremia 10/02/2023   Fall at home, initial encounter 10/02/2023   Cellulitis of right lower extremity  10/02/2023   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   Cervical spondylosis with myelopathy and radiculopathy 04/23/2022   Constipation 03/04/2022   Upper back pain on left side 03/04/2022   Lumbar vertebral fracture, pathologic 09/20/2021   Degenerative spondylolisthesis 08/19/2021   Acute left lumbar radiculopathy 07/05/2021   Senile purpura (HCC) 12/19/2020   Restless  leg 02/02/2020   Bilateral leg edema 01/11/2020   Cervical radiculopathy at C8 06/28/2019   Carpal tunnel syndrome on both sides 06/28/2019   Right sided sciatica 04/14/2018   Trochanteric bursitis of right hip 04/14/2018   H/O cervical spine surgery 11/10/2017   Dysphagia 10/26/2017   Gastroesophageal reflux disease 08/06/2017   Easy bruising 08/06/2017   Mid back pain 03/26/2017   Submandibular gland swelling 01/08/2017   High risk medication use 09/02/2016   Piriformis muscle pain 09/02/2016   Right hip pain 04/08/2016   Right knee pain 02/25/2016   Numbness and tingling in left hand 11/14/2014   Biceps tendon tear 10/10/2014   Insomnia 10/10/2014   Gait disturbance 06/13/2014   OP (osteoporosis) 01/18/2014   Other long term (current) drug therapy 01/18/2014   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 01/01/2013   History of colonic polyps 07/03/2010   Nocturia 05/28/2010   THYROID  NODULE 05/17/2008   Multiple sclerosis (HCC) 05/17/2008   Other fatigue 03/30/2007   Hyperlipidemia 08/20/2006   Essential hypertension 08/20/2006   Rheumatoid arthritis (HCC) 08/20/2006   Osteoarthritis 08/20/2006   CERVICAL CANCER, HX OF 08/20/2006   GASTRIC ULCER, HX OF 08/20/2006   PCP:  Colene Dauphin, MD Pharmacy:   [facility] [phone] Jonette Nestle, [place] - [address] [place] [address] Phone: [phone] Fax: [phone]     Social Drivers of Health (SDOH) Social History: SDOH Screenings   Food Insecurity: No Food Insecurity (10/03/2023)  Housing: Unknown (10/03/2023)  Transportation Needs: No Transportation Needs (10/03/2023)  Utilities: Not At Risk (10/03/2023)  Alcohol  Screen: Low Risk  (02/21/2022)  Depression (PHQ2-9): Low Risk  (07/27/2023)  Financial Resource Strain: Low Risk  (02/21/2022)  Physical Activity: Inactive (02/21/2022)  Social Connections: Moderately Isolated (10/03/2023)  Stress: Stress Concern Present (02/21/2022)  Tobacco Use:  Medium Risk (08/06/2023)   SDOH Interventions:     Readmission Risk Interventions     No data to display

## 2023-10-04 NOTE — Progress Notes (Signed)
 PHARMACY - ANTICOAGULATION CONSULT NOTE  Pharmacy Consult for lovenox Indication: possible DVT  Allergies  Allergen Reactions   Darvon [Propoxyphene Hcl] Shortness Of Breath    ? Dose Related ? Lowered respirations greatly   Demerol [Meperidine] Shortness Of Breath and Other (See Comments)    Respiratory Distress   Penicillins Hives and Other (See Comments)    Has patient had a PCN reaction causing immediate rash, facial/tongue/throat swelling, SOB or lightheadedness with hypotension: No SEVERE RASH INVOLVING MUCUS MEMBRANES or SKIN NECROSIS: #  #  #  YES  #  #  #  Has patient had a PCN reaction that required hospitalization No Has patient had a PCN reaction occurring within the last 10 years: No.    Imuran [Azathioprine] Other (See Comments)    Weakness   Sulfa  Antibiotics Nausea Only and Other (See Comments)    States had severe indigestion and heartburn and nausea and had to stop taking   Arava [Leflunomide] Other (See Comments)    Excessive weight gain   Lipitor [Atorvastatin ] Nausea And Vomiting    Currently taking 2 times weekly - able to tolerate low dose    Vytorin [Ezetimibe-Simvastatin] Nausea And Vomiting    Patient Measurements: Height: 5\' 1"  (154.9 cm) Weight: 54.4 kg (120 lb) IBW/kg (Calculated) : 47.8 HEPARIN  DW (KG): 54.4  Vital Signs: Temp: 97.9 F (36.6 C) (05/11 0740) Temp Source: Oral (05/11 0740) BP: 132/63 (05/11 0740) Pulse Rate: 87 (05/11 0740)  Labs: Recent Labs    10/02/23 1748 10/02/23 1801 10/02/23 1915 10/04/23 0759  HGB 12.4 12.2  --  10.5*  HCT 35.3* 36.0  --  30.0*  PLT 215  --   --  190  LABPROT 14.4  --   --   --   INR 1.1  --   --   --   CREATININE 1.05*  --   --  0.75  CKTOTAL  --   --  1,999*  --     Estimated Creatinine Clearance: 41.6 mL/min (by C-G formula based on SCr of 0.75 mg/dL).   Medical History: Past Medical History:  Diagnosis Date   Anxiety    takes Valium  daily as needed   Basal cell carcinoma  01/21/1988   left nostril (MOHS), sup-right calf (CX35FU)   Basal cell carcinoma 03/22/1996   right calf (CX35FU)   Basal cell carcinoma 03/31/1995   left wing nose   Bruises easily    d/t meds   Cancer (HCC)    basal cell ca, in situ- uterine    Cataracts, bilateral    removed bilateral   Chronic back pain    Dizziness    r/t to meds   GERD (gastroesophageal reflux disease)    no meds on a regular basis but will take Tums if needed   Headache(784.0)    r/t neck issues   History of bronchitis 6-11yrs ago   History of colon polyps    benign   HOH (hard of hearing)    wears hearing aids   Hyperlipidemia    takes Atorvastatin  on Mondays and Fridays   Hypertension    Joint pain    Joint swelling    Multiple sclerosis (HCC)    Neuromuscular disorder (HCC)    Dr. Hobson Luna- [facility], follows [facility]   Nocturia    PONV (postoperative nausea and vomiting)    trouble urinating after surgery in 2014   Postoperative nausea and vomiting 01/11/2019   Rheumatoid arthritis (HCC)  Dr Voncille Guadalajara weekly, RA- hands- knees- feet    Rheumatoid arthritis(714.0)    Dr Alvira Josephs takes Xeljanz  daily   Right wrist fracture    Skin cancer    Squamous cell carcinoma of skin 11/02/2001   in situ-right knee (cx84fu)   Squamous cell carcinoma of skin 11/12/2011   in situ-left forearm (CX35FU), in situ-left foot (CX35FU)   Squamous cell carcinoma of skin 01/22/2012   in situ-left lower forearm (txpbx)   Squamous cell carcinoma of skin 11/17/2012   right shin (txpbx)   Squamous cell carcinoma of skin 10/28/2013   in situ-right shoulder (CX35FU)   Squamous cell carcinoma of skin 04/28/2014   well diff-right shin (txpbx)   Squamous cell carcinoma of skin 11/02/2014   in situ-Left shin (txpbx)   Squamous cell carcinoma of skin 12/15/2014   in situ-Left hand (txpbx), bowens-left side chest (txpbx)   Squamous cell carcinoma of skin 05/09/2015   in situ-left hand (txpbx)     Squamous cell carcinoma of skin 05/07/2016   in situ-left inner shin,ant, in situ-left inner shin, post, in situ-left outer forearm, in situ-right knuckle   Squamous cell carcinoma of skin 03/1302018   in situ-left shoulder (txpbx), in situ-right inner shin (txpbx), in situ-top of left foot (txpbx), KA- left forearm (txpbx)   Squamous cell carcinoma of skin 12/02/2016   in situ-left inner shin (txpbx)   Squamous cell carcinoma of skin 03/04/2017   in situ-left outer sup, shin (txpbx), in situ- right 2nd knuckle finger (txpbx), in situ- Left wrist (txpbx)   Squamous cell carcinoma of skin 04/06/2018   in situ-above left knee inner (txpbx)   Squamous cell carcinoma of skin 04/21/2018   in situ-right top hand (txpbx)   Squamous cell carcinoma of skin 07/08/2018   in situ-right lower inner shin (txpbx)   Squamous cell carcinoma of skin 04/27/2019   in situ- Left neck(CX35FU), In situ- right neck (CX35FU)   Urinary retention    sees Dr.Wrenn about 2 times a yr    Medications:  Medications Prior to Admission  Medication Sig Dispense Refill Last Dose/Taking   atorvastatin  (LIPITOR) 10 MG tablet TAKE 1 TAB ON MONDAYS AND FRIDAY 26 tablet 4    Biotin  5000 MCG TABS Take 10,000 mcg by mouth every evening.      Carboxymethylcellul-Glycerin  (LUBRICATING EYE DROPS OP) Place 1 drop into both eyes daily as needed (dry eyes).      carvedilol  (COREG ) 6.25 MG tablet Take 1 tablet (6.25 mg total) by mouth 2 (two) times daily with a meal. 180 tablet 1    cholecalciferol  (VITAMIN D ) 25 MCG (1000 UNIT) tablet Take 1,000 Units by mouth in the morning.      cyclobenzaprine  (FLEXERIL ) 10 MG tablet TAKE 1 TABLET BY MOUTH THREE TIMES A DAY AS NEEDED FOR MUSCLE SPASM 90 tablet 2    denosumab  (PROLIA ) 60 MG/ML SOSY injection Inject 60 mg into the skin every 6 (six) months. Deliver to rheum: [address], [address], [place], [place] [address]. Appt on 07/23/2022 1 mL 0    diazepam  (VALIUM ) 5 MG tablet TAKE 1 TABLET BY  MOUTH AT BEDTIME AS NEEDED FOR MUSCLE SPASMS. 30 tablet 5    diclofenac sodium (VOLTAREN) 1 % GEL APPLY 3 GRAMS TO 3 LARGE JOINTS 3 TIMES A DAY AS NEEDED 100 g 0    DULoxetine  (CYMBALTA ) 30 MG capsule TAKE 1 CAPSULE BY MOUTH EVERY DAY 90 capsule 1    folic acid  (FOLVITE ) 1 MG tablet Take  1 tablet (1 mg total) by mouth daily. 90 tablet 3    furosemide  (LASIX ) 20 MG tablet TAKE 1 TABLET BY MOUTH EVERY DAY 90 tablet 1    methotrexate  (RHEUMATREX) 2.5 MG tablet Take 5 tablets (12.5 mg total) by mouth once a week. Caution:Chemotherapy. Protect from light. 60 tablet 0    mupirocin  ointment (BACTROBAN ) 2 % Apply 1 Application topically 2 (two) times daily.      pantoprazole  (PROTONIX ) 40 MG tablet Take 1 tablet (40 mg total) by mouth 2 (two) times daily before a meal. 60 tablet 5    predniSONE  (DELTASONE ) 5 MG tablet TAKE 1 TABLET BY MOUTH EVERY DAY IN THE MORNING 90 tablet 0    triamcinolone  cream (KENALOG ) 0.1 % Apply topically.      triamcinolone  ointment (KENALOG ) 0.1 % Apply 1 Application topically 2 (two) times daily.      Upadacitinib  ER ([name] ) 15 MG TB24 Take 15 mg by mouth daily.      Scheduled:   Chlorhexidine  Gluconate Cloth  6 each Topical Daily   cholecalciferol   1,000 Units Oral q AM   enoxaparin (LOVENOX) injection  30 mg Subcutaneous Q24H   folic acid   1 mg Oral Daily   pantoprazole   40 mg Oral BID AC   predniSONE   5 mg Oral Q breakfast    Assessment: 83 yo female with possible RLE DVT and venous duplex pending. Pharmacy consulted to dose lovenox  -hg 12.2> 10.5 -SCr 0.75 -Wt 54kg -lovneox 30mg  Tom[place] given ~ 9am   Goal of Therapy:  Monitor platelets by anticoagulation protocol: Yes   Plan:  -Lovenox 60mg  q12h -CBC in am -F/u venous duplex  Baxter Limber, PharmD Clinical Pharmacist **Pharmacist phone directory can now be found on [email or web address] ([email or web address]).  Listed under [facility].

## 2023-10-04 NOTE — [place] FL2 (Signed)
 [place]  MEDICAID FL2 LEVEL OF CARE FORM     IDENTIFICATION  Patient Name: Sherri [name] Birthdate: May 08, 1941 Sex: female Admission Date (Current Location): 10/02/2023  [facility] and [place] Number:  Producer, television/film/video and Address:  [facility] [place], [address], [place], [place] [address]      Provider Number: [id number]  Attending Physician Name and Address:  Abbe Abate, MD  Relative Name and Phone Number:  Daughter: Chryl Crank [phone]    Current Level of Care: Hospital Recommended Level of Care: Skilled Nursing Facility Prior Approval Number:    Date Approved/Denied: 10/04/23 PASRR Number: [id number]  Discharge Plan: SNF    Current Diagnoses: Patient Active Problem List   Diagnosis Date Noted   Sepsis due to cellulitis (HCC) 10/03/2023   Cellulitis 10/03/2023   Pressure injury of skin 10/03/2023   Hyponatremia 10/02/2023   Fall at home, initial encounter 10/02/2023   Cellulitis of right lower extremity 10/02/2023   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   Cervical spondylosis with myelopathy and radiculopathy 04/23/2022   Constipation 03/04/2022   Upper back pain on left side 03/04/2022   Lumbar vertebral fracture, pathologic 09/20/2021   Degenerative spondylolisthesis 08/19/2021   Acute left lumbar radiculopathy 07/05/2021   Senile purpura (HCC) 12/19/2020   Restless leg 02/02/2020   Bilateral leg edema 01/11/2020   Cervical radiculopathy at C8 06/28/2019   Carpal tunnel syndrome on both sides 06/28/2019   Right sided sciatica 04/14/2018   Trochanteric bursitis of right hip 04/14/2018   H/O cervical spine surgery 11/10/2017   Dysphagia 10/26/2017   Gastroesophageal reflux disease 08/06/2017   Easy bruising 08/06/2017   Mid back pain 03/26/2017   Submandibular gland swelling 01/08/2017   High risk medication use 09/02/2016   Piriformis muscle pain 09/02/2016   Right hip pain 04/08/2016    Right knee pain 02/25/2016   Numbness and tingling in left hand 11/14/2014   Biceps tendon tear 10/10/2014   Insomnia 10/10/2014   Gait disturbance 06/13/2014   OP (osteoporosis) 01/18/2014   Other long term (current) drug therapy 01/18/2014   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 01/01/2013   History of colonic polyps 07/03/2010   Nocturia 05/28/2010   THYROID  NODULE 05/17/2008   Multiple sclerosis (HCC) 05/17/2008   Other fatigue 03/30/2007   Hyperlipidemia 08/20/2006   Essential hypertension 08/20/2006   Rheumatoid arthritis (HCC) 08/20/2006   Osteoarthritis 08/20/2006   CERVICAL CANCER, HX OF 08/20/2006   GASTRIC ULCER, HX OF 08/20/2006    Orientation RESPIRATION BLADDER Height & Weight     Self, Time, Situation, Place  O2 Incontinent Weight: 120 lb (54.4 kg) Height:  5\' 1"  (154.9 cm)  BEHAVIORAL SYMPTOMS/MOOD NEUROLOGICAL BOWEL NUTRITION STATUS      Continent    AMBULATORY STATUS COMMUNICATION OF NEEDS Skin   Limited Assist Verbally                         Personal Care Assistance Level of Assistance  Dressing     Dressing Assistance: Limited assistance     Functional Limitations Info  Sight, Hearing Sight Info: Adequate Hearing Info: Impaired      SPECIAL CARE FACTORS FREQUENCY  OT (By licensed OT), PT (By licensed PT)     PT Frequency: 5x OT Frequency: 3x            Contractures Contractures Info: Not present    Additional Factors Info  Code Status  Code Status Info: Fll             Current Medications (10/04/2023):  This is the current hospital active medication list Current Facility-Administered Medications  Medication Dose Route Frequency Provider Last Rate Last Admin   0.9 %  sodium chloride  infusion   Intravenous Continuous Abbe Abate, MD 60 mL/hr at 10/04/23 0328 New Bag at 10/04/23 0328   acetaminophen  (TYLENOL ) tablet 650 mg  650 mg Oral Q6H PRN Patel, Vishal R, MD   650 mg at 10/03/23 0537   bisacodyl  (DULCOLAX)  EC tablet 5 mg  5 mg Oral Daily PRN Patel, Vishal R, MD       cefTRIAXone (ROCEPHIN) 2 g in sodium chloride  0.9 % 100 mL IVPB  2 g Intravenous Q24H Patel, Vishal R, MD 200 mL/hr at 10/04/23 0529 2 g at 10/04/23 0529   Chlorhexidine  Gluconate Cloth 2 % PADS 6 each  6 each Topical Daily Abbe Abate, MD   6 each at 10/04/23 9147   cholecalciferol  (VITAMIN D3) 25 MCG (1000 UNIT) tablet 1,000 Units  1,000 Units Oral q AM Abbe Abate, MD   1,000 Units at 10/04/23 0529   cyclobenzaprine  (FLEXERIL ) tablet 10 mg  10 mg Oral TID PRN Abbe Abate, MD       diazepam  (VALIUM ) tablet 5 mg  5 mg Oral QHS PRN Abbe Abate, MD       enoxaparin (LOVENOX) injection 30 mg  30 mg Subcutaneous Q24H Patel, Vishal R, MD   30 mg at 10/04/23 8295   folic acid  (FOLVITE ) tablet 1 mg  1 mg Oral Daily Abbe Abate, MD   1 mg at 10/04/23 6213   HYDROcodone -acetaminophen  (NORCO/VICODIN) 5-325 MG per tablet 1-2 tablet  1-2 tablet Oral Q6H PRN Abbe Abate, MD   1 tablet at 10/03/23 1237   ondansetron  (ZOFRAN ) tablet 4 mg  4 mg Oral Q6H PRN Patel, Vishal R, MD       Or   ondansetron  (ZOFRAN ) injection 4 mg  4 mg Intravenous Q6H PRN Patel, Vishal R, MD       oxyCODONE  (Oxy IR/ROXICODONE ) immediate release tablet 5-10 mg  5-10 mg Oral Q4H PRN Abbe Abate, MD   10 mg at 10/04/23 0865   pantoprazole  (PROTONIX ) EC tablet 40 mg  40 mg Oral BID AC Abbe Abate, MD   40 mg at 10/04/23 7846   predniSONE  (DELTASONE ) tablet 5 mg  5 mg Oral Q breakfast Patel, Vishal R, MD   5 mg at 10/04/23 0759   senna-docusate (Senokot-S) tablet 1 tablet  1 tablet Oral QHS PRN Patel, Vishal R, MD       vancomycin  (VANCOREADY) IVPB 750 mg/150 mL  750 mg Intravenous Q24H Melodie Spry, MD         Discharge Medications: Please see discharge summary for a list of discharge medications.  Relevant Imaging Results:  Relevant Lab Results:   Additional Information SS: [id number]  Hilda Lovings,  LCSW

## 2023-10-05 ENCOUNTER — Other Ambulatory Visit: Payer: Self-pay

## 2023-10-05 ENCOUNTER — Inpatient Hospital Stay (HOSPITAL_COMMUNITY)

## 2023-10-05 ENCOUNTER — Encounter (HOSPITAL_COMMUNITY): Payer: Self-pay | Admitting: Internal Medicine

## 2023-10-05 DIAGNOSIS — L0390 Cellulitis, unspecified: Secondary | ICD-10-CM | POA: Diagnosis not present

## 2023-10-05 DIAGNOSIS — A419 Sepsis, unspecified organism: Secondary | ICD-10-CM | POA: Diagnosis not present

## 2023-10-05 DIAGNOSIS — M7989 Other specified soft tissue disorders: Secondary | ICD-10-CM

## 2023-10-05 LAB — BASIC METABOLIC PANEL WITH GFR
Anion gap: 9 (ref 5–15)
BUN: 7 mg/dL — ABNORMAL LOW (ref 8–23)
CO2: 24 mmol/L (ref 22–32)
Calcium: 8.1 mg/dL — ABNORMAL LOW (ref 8.9–10.3)
Chloride: 97 mmol/L — ABNORMAL LOW (ref 98–111)
Creatinine, Ser: 0.61 mg/dL (ref 0.44–1.00)
GFR, Estimated: >60 mL/min (ref >60)
Glucose, Bld: 92 mg/dL (ref 70–99)
Potassium: 3.2 mmol/L — ABNORMAL LOW (ref 3.5–5.1)
Sodium: 130 mmol/L — ABNORMAL LOW (ref 135–145)

## 2023-10-05 LAB — CK: Total CK: 557 U/L — ABNORMAL HIGH (ref 38–234)

## 2023-10-05 MED ORDER — ENOXAPARIN SODIUM 40 MG/0.4ML IJ SOSY
40.0000 mg | PREFILLED_SYRINGE | INTRAMUSCULAR | Status: DC
  Administered 2023-10-06 – 2023-10-09 (×3): 40 mg via SUBCUTANEOUS
  Filled 2023-10-05 (×4): qty 0.4

## 2023-10-05 MED ORDER — ENOXAPARIN SODIUM 30 MG/0.3ML IJ SOSY: 30.0000 mg | PREFILLED_SYRINGE | INTRAMUSCULAR | Status: DC

## 2023-10-05 MED ORDER — ENOXAPARIN SODIUM 40 MG/0.4ML IJ SOSY: 40.0000 mg | PREFILLED_SYRINGE | INTRAMUSCULAR | Status: DC

## 2023-10-05 MED ORDER — NALOXONE HCL 0.4 MG/ML IJ SOLN: 0.4000 mg | INTRAMUSCULAR | Status: DC | PRN

## 2023-10-05 MED ORDER — HYDROMORPHONE HCL 1 MG/ML IJ SOLN
0.5000 mg | Freq: Once | INTRAMUSCULAR | Status: AC | PRN
  Administered 2023-10-05: 0.5 mg via INTRAVENOUS
  Filled 2023-10-05: qty 1

## 2023-10-05 MED ORDER — MORPHINE SULFATE (PF) 2 MG/ML IV SOLN
1.0000 mg | Freq: Once | INTRAVENOUS | Status: DC | PRN
Start: 2023-10-05 — End: 2023-10-05

## 2023-10-05 NOTE — Progress Notes (Signed)
 RLE venous duplex exam has been completed.   Results can be found under chart review under CV PROC. 10/05/2023 12:46 PM Myda Detwiler RVT, RDMS

## 2023-10-05 NOTE — Progress Notes (Signed)
 Sherri [name]  JYN:[id number] DOB: April 05, 1941 DOA: 10/02/2023 PCP: Colene Dauphin, MD    Brief Narrative:  83 year old with a history of MS in remission, RA on chronic prednisone  MTX and [name] , HTN, HLD, chronic pain, and anxiety who presented to the ER from home with 2 days of right leg swelling and erythema after a mechanical fall at home.  She has an unrelated persistent open wound of her right lower extremity following a squamous cell carcinoma excision for which she has been seeing a Dermatologist in the outpatient setting.  Notes on 5/2 suggest the wound was improving.  In the ER CT head was without acute findings but did note extensive chronic microvascular change.  CT right lower extremity showed an indeterminant popliteal vein focal dilation with no clear filling defect with similar findings of the mid femoral vein.  There was however no evidence of fracture or subcutaneous emphysema or abscess.  Goals of Care:   Code Status: Full Code   DVT prophylaxis: Full dose Lovenox while awaiting venous duplex to rule out DVT   Interim Hx: Afebrile.  Vital signs stable.  Reports no significant improvement in her right lower extremity symptoms.  Denies chest pain or shortness of breath.  Reports fair appetite.  No nausea or vomiting.  Assessment & Plan:  Stubborn cellulitis of the right lower extremity Point of entry appears to be slow healing wound related to excision of squamous cell carcinoma - continue broad-spectrum empiric antibiotics and wound care -discontinued vancomycin  as MRSA screen was negative - keep foot elevated  Sepsis POA due to cellulitis Patient was febrile with tachycardia and tachypnea with a clear source of bacterial infection on presentation - she was treated with volume resuscitation and empiric broad-spectrum antibiotics - sepsis physiology has now resolved  DVT of the right lower extremity ruled out Indeterminate findings on CT could be suggestive of DVT of the  right lower extremity - venous duplex w/o evidence of DVT - return to prophy dose lovenox today   Metabolic encephalopathy due to acute bacterial infection with sepsis Mild confusion noted at time of presentation - CT head without acute findings - B12 is not low - RPR nonreactive - folate normal -mental status slowly improving with treatment of acute infection  Mild hyponatremia Likely simply due to hypovolemia - SSRI on hold - sodium improving with volume expansion  Recent mechanical fall at home - deconditioning No evidence of significant injury on ER evaluation - PT/OT are suggesting a SNF rehab stay as she has displayed decreased awareness recall and problem-solving with decreased strength transfers gait and mobility with no assistance at home  Mild LFT elevations Likely simply due to sepsis - monitor trend  Rheumatoid arthritis Followed by Rheumatology in the outpatient setting - continue chronic prednisone  but hold biologic and methotrexate  for now in setting of acute infection  HTN Blood pressure well-controlled  MS Followed by Neurology in outpatient setting - has been off DMT since 2019 and stable  Chronic low back pain with radiculopathy Stable at present  HLD Resume atorvastatin  once oral intake improved   Family Communication: No family present at time of exam Disposition: Will need SNF rehab stay -awaiting clinical improvement in lower extremity edema and performance of venous duplex before patient cleared for discharge   Objective: Blood pressure 135/67, pulse 98, temperature 98.7 F (37.1 C), temperature source Oral, resp. rate 18, height 5\' 1"  (1.549 m), weight 54.4 kg, SpO2 99%.  Intake/Output Summary (Last 24 hours) at  10/05/2023 0759 Last data filed at 10/05/2023 1191 Gross per 24 hour  Intake 2519.49 ml  Output 2550 ml  Net -30.51 ml   Filed Weights   10/02/23 1822  Weight: 54.4 kg    Examination: General: No acute respiratory distress Lungs:  Clear to auscultation bilaterally  Cardiovascular: Regular rate and rhythm without murmur  Abdomen: Nontender, nondistended, soft, bowel sounds positive, no rebound Extremities: Dressing removed from the right lower extremity -previous biopsy site is stable with no purulent discharge -erythema of the leg itself is diminishing -the right foot remains markedly edematous with associated erythema but no wounds and no discharge -left lower extremity without erythema or edema  CBC: Recent Labs  Lab 10/02/23 1748 10/02/23 1801 10/04/23 0759  WBC 5.1  --  7.1  NEUTROABS 4.4  --   --   HGB 12.4 12.2 10.5*  HCT 35.3* 36.0 30.0*  MCV 102.3*  --  103.1*  PLT 215  --  190   Basic Metabolic Panel: Recent Labs  Lab 10/02/23 1748 10/02/23 1801 10/04/23 0759  NA 125* 123* 129*  K 3.6 3.6 3.6  CL 90*  --  97*  CO2 26  --  19*  GLUCOSE 84  --  77  BUN 11  --  9  CREATININE 1.05*  --  0.75  CALCIUM  9.4  --  8.5*   GFR: Estimated Creatinine Clearance: 41.6 mL/min (by C-G formula based on SCr of 0.75 mg/dL).   Scheduled Meds:  Chlorhexidine  Gluconate Cloth  6 each Topical Daily   cholecalciferol   1,000 Units Oral q AM   enoxaparin (LOVENOX) injection  60 mg Subcutaneous Q12H   folic acid   1 mg Oral Daily   pantoprazole   40 mg Oral BID AC   predniSONE   5 mg Oral Q breakfast   Continuous Infusions:  sodium chloride  40 mL/hr at 10/05/23 0639   cefTRIAXone (ROCEPHIN)  IV 2 g (10/05/23 0535)     LOS: 2 days   Abbe Abate, MD [facility] Office  [phone] Pager - Text Page per Tilford Foley  If 7PM-7AM, please contact night-coverage per Amion 10/05/2023, 7:59 AM

## 2023-10-05 NOTE — Plan of Care (Signed)

## 2023-10-05 NOTE — Progress Notes (Signed)
 Mobility Specialist: Progress Note   10/05/23 1545  Mobility  Activity Transferred from bed to chair  Level of Assistance Contact guard assist, steadying assist  Assistive Device Front wheel walker  Activity Response Tolerated well  Mobility Referral Yes  Mobility visit 1 Mobility  Mobility Specialist Start Time (ACUTE ONLY) 0850  Mobility Specialist Stop Time (ACUTE ONLY) [id number]  Mobility Specialist Time Calculation (min) (ACUTE ONLY) 13 min    Pt was agreeable to mobility session - received in bed. SV for bed mobility. CG for STS. Declined ambulation d/t RLE pain stating she could barely WB through it. Agreeable to transfer to chair. Stand pivot to chair with CG. Left in chair with all needs met, call bell in reach.   Deloria Fetch Mobility Specialist Please contact via SecureChat or Rehab office at [phone]

## 2023-10-05 NOTE — TOC Progression Note (Signed)
 Transition of Care [facility]) - Progression Note    Patient Details  Name: Sherri  CALLAHAN [name] MRN: [id number] Date of Birth: 03/13/1941  [facility]) CM/SW Contact  Katrinka Parr, [place] Phone Number: 10/05/2023, 11:19 AM  Clinical Narrative:     CSW met with pt bedside and provided SNF bed offers. Pt explains she would prefer to go home with [facility]. She lives in [place] with her husband. States her home is accessible with ramps, walk-in bath, has 3in1, and walker. She states they hire private care givers at times when they need more support. CSW notified RNCM.  Expected Discharge Plan: Skilled Nursing Facility Barriers to Discharge: Continued Medical Work up  Expected Discharge Plan and Services In-house Referral: Clinical Social Work   Post Acute Care Choice: Skilled Nursing Facility Living arrangements for the past 2 months: Single Family Home                                       Social Determinants of Health (SDOH) Interventions SDOH Screenings   Food Insecurity: No Food Insecurity (10/03/2023)  Housing: Low Risk  (10/04/2023)  Transportation Needs: No Transportation Needs (10/03/2023)  Utilities: Not At Risk (10/03/2023)  Alcohol  Screen: Low Risk  (02/21/2022)  Depression (PHQ2-9): Low Risk  (07/27/2023)  Financial Resource Strain: Low Risk  (02/21/2022)  Physical Activity: Inactive (02/21/2022)  Social Connections: Moderately Isolated (10/03/2023)  Stress: Stress Concern Present (02/21/2022)  Tobacco Use: Medium Risk (10/05/2023)    Readmission Risk Interventions     No data to display

## 2023-10-05 NOTE — TOC Progression Note (Addendum)
 Transition of Care [facility]) - Progression Note    Patient Details  Name: Sherri [name] MRN: [id number] Date of Birth: 1941/02/19  Transition of Care [facility]) CM/SW Contact  Dane Dung, RN Phone Number: 10/05/2023, 12:22 PM  Clinical Narrative:    CM spoke with MSW and patient had declined SNF placement and plans to return home with her spouse when stable.  Patient was off the unit at this time for a procedure.  I called and left a detailed message with her daughter and I plan to follow up with the patient this afternoon to discuss home health and likely need for [facility] services if patient agrees.  I called and spoke with the patient's daughter, who is a retired Charity fundraiser from [employer].  The patient's daughter states that the patient lives with the spouse but the daughter and granddaughter assist with care at the home as needed,  Patient's daughter was aware that patient is requesting to return home with home health when stable.  I offered Medicare choice to the daughter and the daughter did not have a preference.  I called [facility], and Artavia, , RNCM accepted.  HH orders placed to be co-signed by MD.   Expected Discharge Plan: Skilled Nursing Facility Barriers to Discharge: Continued Medical Work up  Expected Discharge Plan and Services In-house Referral: Clinical Social Work   Post Acute Care Choice: Skilled Nursing Facility Living arrangements for the past 2 months: Single Family Home                                       Social Determinants of Health (SDOH) Interventions SDOH Screenings   Food Insecurity: No Food Insecurity (10/03/2023)  Housing: Low Risk  (10/04/2023)  Transportation Needs: No Transportation Needs (10/03/2023)  Utilities: Not At Risk (10/03/2023)  Alcohol  Screen: Low Risk  (02/21/2022)  Depression (PHQ2-9): Low Risk  (07/27/2023)  Financial Resource Strain: Low Risk  (02/21/2022)  Physical Activity: Inactive (02/21/2022)  Social Connections: Moderately  Isolated (10/03/2023)  Stress: Stress Concern Present (02/21/2022)  Tobacco Use: Medium Risk (10/05/2023)    Readmission Risk Interventions     No data to display

## 2023-10-06 DIAGNOSIS — L0390 Cellulitis, unspecified: Secondary | ICD-10-CM | POA: Diagnosis not present

## 2023-10-06 DIAGNOSIS — A419 Sepsis, unspecified organism: Secondary | ICD-10-CM | POA: Diagnosis not present

## 2023-10-06 LAB — BASIC METABOLIC PANEL WITH GFR
Anion gap: 10 (ref 5–15)
BUN: <5 mg/dL — ABNORMAL LOW (ref 8–23)
CO2: 27 mmol/L (ref 22–32)
Calcium: 8.2 mg/dL — ABNORMAL LOW (ref 8.9–10.3)
Chloride: 93 mmol/L — ABNORMAL LOW (ref 98–111)
Creatinine, Ser: 0.61 mg/dL (ref 0.44–1.00)
GFR, Estimated: >60 mL/min (ref >60)
Glucose, Bld: 91 mg/dL (ref 70–99)
Potassium: 3 mmol/L — ABNORMAL LOW (ref 3.5–5.1)
Sodium: 130 mmol/L — ABNORMAL LOW (ref 135–145)

## 2023-10-06 LAB — CBC
HCT: 31.2 % — ABNORMAL LOW (ref 36.0–46.0)
Hemoglobin: 10.9 g/dL — ABNORMAL LOW (ref 12.0–15.0)
MCH: 34.9 pg — ABNORMAL HIGH (ref 26.0–34.0)
MCHC: 34.9 g/dL (ref 30.0–36.0)
MCV: 100 fL (ref 80.0–100.0)
Platelets: 200 10*3/uL (ref 150–400)
RBC: 3.12 MIL/uL — ABNORMAL LOW (ref 3.87–5.11)
RDW: 15.8 % — ABNORMAL HIGH (ref 11.5–15.5)
WBC: 4.6 10*3/uL (ref 4.0–10.5)
nRBC: 0 % (ref 0.0–0.2)

## 2023-10-06 LAB — MAGNESIUM: Magnesium: 1.8 mg/dL (ref 1.7–2.4)

## 2023-10-06 MED ORDER — DOXYCYCLINE HYCLATE 100 MG PO TABS
100.0000 mg | ORAL_TABLET | Freq: Two times a day (BID) | ORAL | Status: DC
Start: 2023-10-06 — End: 2023-10-09
  Administered 2023-10-06 – 2023-10-09 (×7): 100 mg via ORAL
  Filled 2023-10-06 (×8): qty 1

## 2023-10-06 MED ORDER — DIAZEPAM 2 MG PO TABS: 2.0000 mg | ORAL_TABLET | Freq: Every evening | ORAL | Status: DC | PRN

## 2023-10-06 MED ORDER — POTASSIUM CHLORIDE CRYS ER 20 MEQ PO TBCR
40.0000 meq | EXTENDED_RELEASE_TABLET | Freq: Two times a day (BID) | ORAL | Status: AC
  Administered 2023-10-06 – 2023-10-07 (×3): 40 meq via ORAL
  Filled 2023-10-06 (×3): qty 2

## 2023-10-06 MED ORDER — OXYCODONE HCL 5 MG PO TABS
5.0000 mg | ORAL_TABLET | ORAL | Status: DC | PRN
  Administered 2023-10-06 – 2023-10-09 (×8): 5 mg via ORAL
  Filled 2023-10-06 (×8): qty 1

## 2023-10-06 NOTE — Plan of Care (Signed)

## 2023-10-06 NOTE — Progress Notes (Signed)
 Orthopedic Tech Progress Note Patient Details:  [name] 02/09/1941 [id number]  Reached out to RN about an Jenice Mitts order for patient to the right leg, but she also has order for daily wound care for that leg also. Something will not be getting done..   Patient ID: Sherri [name]  Trudi Fus, female   DOB: [birth year], 83 y.o.   MRN: [id number]  Kermitt Pedlar 10/06/2023, 3:19 PM

## 2023-10-06 NOTE — Progress Notes (Signed)
 Physical Therapy Treatment Patient Details Name: Sherri [name] MRN: [id number] DOB: [birth year] Today's Date: 10/06/2023   History of Present Illness 83 y.o. female admitted 10/02/23 after fall at home with RLE edema. Pt with cellulitis and metabolic encephalopathy. PMhx: multiple sclerosis in remission, RA, HTN, HLD, chronic pain, anxiety, skin CA, HOH, cervical CA, Lt THA, TSA, cervical fusion    PT Comments  Pt with increased confusion and decreased mobility tolerance from last session. Daughter present throughout session, confirming family can assist and that pt is normally independent with intact cognition. Within session daughter recognizing level of assist pt currently requires will be challenging for family, she also states pt gets confused with pain medication. Pt with pain in RLE with decreased tolerance for weight bearing and total assist to adjust and pull up TED hose, limited by pain. Will continue to follow.  Patient will benefit from continued inpatient follow up therapy, <3 hours/day     If plan is discharge home, recommend the following: A little help with walking and/or transfers;A lot of help with bathing/dressing/bathroom;Assist for transportation;Assistance with cooking/housework;Supervision due to cognitive status   Can travel by private vehicle     Yes  Equipment Recommendations  None recommended by PT    Recommendations for Other Services       Precautions / Restrictions Precautions Precautions: Fall Recall of Precautions/Restrictions: Impaired     Mobility  Bed Mobility Overal bed mobility: Needs Assistance Bed Mobility: Supine to Sit     Supine to sit: Supervision, HOB elevated, Used rails     General bed mobility comments: HOB 35 degrees with use of rail, increased time and effort to clear legs off surface with multimodal cues for sequence and attention as pt frequently distracted during task    Transfers Overall transfer level: Needs assistance    Transfers: Sit to/from Stand Sit to Stand: Contact guard assist           General transfer comment: CGA to rise from bed and lower to chair, cues for hand placement and safety    Ambulation/Gait Ambulation/Gait assistance: Min assist, +2 safety/equipment Gait Distance (Feet): 7 Feet Assistive device: Rolling walker (2 wheels) Gait Pattern/deviations: Step-to pattern, Decreased stride length, Trunk flexed   Gait velocity interpretation: <1.31 ft/sec, indicative of household ambulator   General Gait Details: pt needing sequential cues to progress gait with step to pattern, leaning to right and Rt foot in inversion with stepping despite cues. Pt with decreased command following and gait tolerance this session with close chair follow, limited by fatigue, grossly 8 min to achieve limited gait   Stairs             Wheelchair Mobility     Tilt Bed    Modified Rankin (Stroke Patients Only)       Balance Overall balance assessment: Needs assistance Sitting-balance support: Feet supported, Bilateral upper extremity supported Sitting balance-Leahy Scale: Fair Sitting balance - Comments: EOb without support   Standing balance support: Bilateral upper extremity supported, Reliant on assistive device for balance, During functional activity Standing balance-Leahy Scale: Poor Standing balance comment: Rw in standing                            Communication Communication Communication: Impaired Factors Affecting Communication: Hearing impaired  Cognition Arousal: Alert Behavior During Therapy: Flat affect   PT - Cognitive impairments: Memory, Orientation, Awareness, Problem solving, Safety/Judgement, Attention   Orientation impairments: Time  PT - Cognition Comments: pt with decreased awareness of deficits and safety, mod cues for hand placement and attention to task, could not recall daughter had received meal tray and perseverating  on wanting daughter to eat. Following commands: Impaired Following commands impaired: Follows one step commands inconsistently, Follows one step commands with increased time    Cueing Cueing Techniques: Verbal cues, Gestural cues, Tactile cues  Exercises General Exercises - Lower Extremity Long Arc Quad: AROM, Right, Seated, 10 reps    General Comments        Pertinent Vitals/Pain Pain Assessment Pain Assessment: 0-10 Pain Score: 7  Pain Location: RLE Pain Descriptors / Indicators: Aching, Sore, Guarding Pain Intervention(s): Limited activity within patient's tolerance, Monitored during session, Repositioned    Home Living                          Prior Function            PT Goals (current goals can now be found in the care plan section) Progress towards PT goals: Progressing toward goals    Frequency    Min 2X/week      PT Plan      Co-evaluation              AM-PAC PT "6 Clicks" Mobility   Outcome Measure  Help needed turning from your back to your side while in a flat bed without using bedrails?: A Little Help needed moving from lying on your back to sitting on the side of a flat bed without using bedrails?: A Little Help needed moving to and from a bed to a chair (including a wheelchair)?: A Little Help needed standing up from a chair using your arms (e.g., wheelchair or bedside chair)?: A Little Help needed to walk in hospital room?: A Lot Help needed climbing 3-5 steps with a railing? : Total 6 Click Score: 15    End of Session Equipment Utilized During Treatment: Gait belt Activity Tolerance: Patient tolerated treatment well Patient left: in chair;with call bell/phone within reach;with chair alarm set;with family/visitor present Nurse Communication: Mobility status PT Visit Diagnosis: Other abnormalities of gait and mobility (R26.89);Muscle weakness (generalized) (M62.81)     Time: 2130-8657 PT Time Calculation (min) (ACUTE  ONLY): 25 min  Charges:    $Gait Training: 8-22 mins $Therapeutic Activity: 8-22 mins PT General Charges $$ ACUTE PT VISIT: 1 Visit                     Annis Baseman, PT Acute Rehabilitation Services Office: [phone]    Humza Tallerico B Candise Crabtree 10/06/2023, 10:14 AM

## 2023-10-06 NOTE — TOC Progression Note (Signed)
 Transition of Care [facility]) - Progression Note    Patient Details  Name: Sherri  KHYA [name] MRN: [id number] Date of Birth: 1940-11-30  Transition of Care [facility]) CM/SW Contact  Dane Dung, RN Phone Number: 10/06/2023, 9:37 AM  Clinical Narrative:    CM met with the patient and daughter at the bedside and the patient has now decided that she needs to admit to SNF rather than discharging home.   The patient's daughter states that her preference is [facility] if possible.  Bed offers will need to be provided to the daughter for choice.  Cyrus, MSW was updated.   Expected Discharge Plan: Skilled Nursing Facility Barriers to Discharge: Continued Medical Work up  Expected Discharge Plan and Services In-house Referral: Clinical Social Work   Post Acute Care Choice: Skilled Nursing Facility Living arrangements for the past 2 months: Single Family Home                                       Social Determinants of Health (SDOH) Interventions SDOH Screenings   Food Insecurity: No Food Insecurity (10/03/2023)  Housing: Low Risk  (10/04/2023)  Transportation Needs: No Transportation Needs (10/03/2023)  Utilities: Not At Risk (10/03/2023)  Alcohol  Screen: Low Risk  (02/21/2022)  Depression (PHQ2-9): Low Risk  (07/27/2023)  Financial Resource Strain: Low Risk  (02/21/2022)  Physical Activity: Inactive (02/21/2022)  Social Connections: Moderately Isolated (10/03/2023)  Stress: Stress Concern Present (02/21/2022)  Tobacco Use: Medium Risk (10/05/2023)    Readmission Risk Interventions     No data to display

## 2023-10-06 NOTE — Progress Notes (Signed)
 Goddess  A Welte  ZOX:[id number] DOB: 1941/02/28 DOA: 10/02/2023 PCP: Colene Dauphin, MD    Brief Narrative:  83 year old with a history of MS in remission, RA on chronic prednisone  MTX and [name] , HTN, HLD, chronic pain, and anxiety who presented to the ER from home with 2 days of right leg swelling and erythema after a mechanical fall at home.  She has an unrelated persistent open wound of her right lower extremity following a squamous cell carcinoma excision for which she has been seeing a Dermatologist in the outpatient setting.  Notes on 5/2 suggest the wound was improving.  In the ER CT head was without acute findings but did note extensive chronic microvascular change.  CT right lower extremity showed an indeterminant popliteal vein focal dilation with no clear filling defect with similar findings of the mid femoral vein.  There was however no evidence of fracture or subcutaneous emphysema or abscess.  Goals of Care:   Code Status: Full Code   DVT prophylaxis: enoxaparin (LOVENOX) injection 40 mg Start: 10/06/23 0800 Place TED hose Start: 10/05/23 1533Full dose Lovenox while awaiting venous duplex to rule out DVT   Interim Hx: No acute events reported overnight.  Afebrile.  Vital signs stable.  No new complaints today.  Denies shortness of breath or chest pain.  Is tolerating compression stocking of right lower extremity relatively well.  No fevers or chills.  Assessment & Plan:  Stubborn cellulitis of the right lower extremity +/- venous insufficiency Point of entry appears to be slow healing wound related to excision of squamous cell carcinoma - continue broad-spectrum empiric antibiotics and wound care, with transition to oral antibiotic today - discontinued vancomycin  as MRSA screen was negative - keep foot elevated - suspect a component of venous insufficiency compounding her edema and even erythema -her leg is undressed and examined by the MD today and reveals improving but  persisting erythema without purulent discharge with significant edema persisting of the right foot -we will change to an Unna boot today for more consistent compression given my concern that venous insufficiency is contributing -I have counseled her on the absolute need to increase mobility to avoid worsening deformity at her right ankle where there is already a developing propensity for inversion of the foot  Sepsis POA due to cellulitis Patient was febrile with tachycardia and tachypnea with a clear source of bacterial infection on presentation - she was treated with volume resuscitation and empiric broad-spectrum antibiotics - sepsis physiology has now resolved  DVT of the right lower extremity ruled out Indeterminate findings on CT were suggestive of DVT of the right lower extremity - venous duplex w/o evidence of DVT - returned to prophy dose lovenox   Metabolic encephalopathy due to acute bacterial infection with sepsis Mild confusion noted at time of presentation - CT head without acute findings - B12 is not low - RPR nonreactive - folate normal - mental status appears to have returned to her baseline as of today  Mild hyponatremia Likely simply due to hypovolemia - SSRI on hold - sodium improved initially w/ volume expansion, but now appears to have stalled at ~130 - monitor trend for now w/o further intervention - Na not low enough to likely be symptomatic   Hypokalemia Supplement and follow -magnesium  not significantly depressed  Recent mechanical fall at home - deconditioning No evidence of significant injury on ER evaluation - PT/OT are suggesting a SNF rehab stay as she has displayed decreased awareness recall and problem-solving with  decreased strength transfers gait and mobility with no assistance at home -plan is for discharge to rehab facility as soon as bed can be arranged  Foley catheter Foley catheter was placed during this admission -it will be removed today and we will need  to assure that the patient is urinating without difficulty prior to her discharge  Mild LFT elevations Likely simply due to sepsis - monitor trend  Rheumatoid arthritis Followed by Rheumatology in the outpatient setting - continue chronic prednisone  but hold biologic and methotrexate  for now in setting of acute infection  HTN Blood pressure well-controlled  MS Followed by Neurology in outpatient setting - has been off DMT since 2019 and stable  Chronic low back pain with radiculopathy Stable at present  HLD Resume atorvastatin  once oral intake improved   Family Communication: spoke with daughter at bedside at length Disposition: Will need SNF rehab stay -if she is able to urinate without her Foley catheter she should be ready for discharge to SNF 5/14   Objective: Blood pressure (!) 162/82, pulse 99, temperature 99.2 F (37.3 C), temperature source Oral, resp. rate 19, height 5\' 1"  (1.549 m), weight 54.4 kg, SpO2 96%.  Intake/Output Summary (Last 24 hours) at 10/06/2023 0817 Last data filed at 10/06/2023 0500 Gross per 24 hour  Intake 344.42 ml  Output 2750 ml  Net -2405.58 ml   Filed Weights   10/02/23 1822  Weight: 54.4 kg    Examination: General: No acute respiratory distress Lungs: Clear to auscultation bilaterally  Cardiovascular: Regular rate and rhythm without murmur  Abdomen: Nontender, nondistended, soft, bowel sounds positive, no rebound Extremities: Dressing removed from the right lower extremity - previous biopsy site remains stable with no purulent discharge - erythema of the leg itself is continues to improve-the right foot remains edematous with persisting erythema but no wounds and no discharge -left lower extremity without erythema or edema  CBC: Recent Labs  Lab 10/02/23 1748 10/02/23 1801 10/04/23 0759 10/06/23 0644  WBC 5.1  --  7.1 4.6  NEUTROABS 4.4  --   --   --   HGB 12.4 12.2 10.5* 10.9*  HCT 35.3* 36.0 30.0* 31.2*  MCV 102.3*  --   103.1* 100.0  PLT 215  --  190 200   Basic Metabolic Panel: Recent Labs  Lab 10/04/23 0759 10/05/23 0638 10/06/23 0644  NA 129* 130* 130*  K 3.6 3.2* 3.0*  CL 97* 97* 93*  CO2 19* 24 27  GLUCOSE 77 92 91  BUN 9 7* <5*  CREATININE 0.75 0.61 0.61  CALCIUM  8.5* 8.1* 8.2*   GFR: Estimated Creatinine Clearance: 41.6 mL/min (by C-G formula based on SCr of 0.61 mg/dL).   Scheduled Meds:  Chlorhexidine  Gluconate Cloth  6 each Topical Daily   cholecalciferol   1,000 Units Oral q AM   enoxaparin (LOVENOX) injection  40 mg Subcutaneous Q24H   folic acid   1 mg Oral Daily   pantoprazole   40 mg Oral BID AC   predniSONE   5 mg Oral Q breakfast   Continuous Infusions:  cefTRIAXone (ROCEPHIN)  IV 2 g (10/06/23 0547)     LOS: 3 days   Abbe Abate, MD [facility] Office  [phone] Pager - Text Page per Tilford Foley  If 7PM-7AM, please contact night-coverage per Amion 10/06/2023, 8:17 AM

## 2023-10-06 NOTE — TOC Progression Note (Signed)
 Transition of Care [facility]) - Progression Note    Patient Details  Name: Sherri [name]  MARRY MERRIMAN MRN: [id number] Date of Birth: 02-12-1941  Transition of Care [facility]) CM/SW Contact  Katrinka Parr, [place] Phone Number: 10/06/2023, 3:08 PM  Clinical Narrative:     CSW notified by RNCm that pt agreeable to SNF now and has preference for [facility]. [facility] can offer a bed. CSW met with pt and pt's daughter bedside and they are agreeable to [facility].   CSW initiated SNF auth request in online portal. Auth# [id number] Siegfried Dress is pending.   Expected Discharge Plan: Skilled Nursing Facility Barriers to Discharge: Continued Medical Work up  Expected Discharge Plan and Services In-house Referral: Clinical Social Work   Post Acute Care Choice: Skilled Nursing Facility Living arrangements for the past 2 months: Single Family Home                                       Social Determinants of Health (SDOH) Interventions SDOH Screenings   Food Insecurity: No Food Insecurity (10/03/2023)  Housing: Low Risk  (10/04/2023)  Transportation Needs: No Transportation Needs (10/03/2023)  Utilities: Not At Risk (10/03/2023)  Alcohol  Screen: Low Risk  (02/21/2022)  Depression (PHQ2-9): Low Risk  (07/27/2023)  Financial Resource Strain: Low Risk  (02/21/2022)  Physical Activity: Inactive (02/21/2022)  Social Connections: Moderately Isolated (10/03/2023)  Stress: Stress Concern Present (02/21/2022)  Tobacco Use: Medium Risk (10/05/2023)    Readmission Risk Interventions     No data to display

## 2023-10-07 DIAGNOSIS — E871 Hypo-osmolality and hyponatremia: Secondary | ICD-10-CM

## 2023-10-07 DIAGNOSIS — G35 Multiple sclerosis: Secondary | ICD-10-CM

## 2023-10-07 DIAGNOSIS — L03115 Cellulitis of right lower limb: Secondary | ICD-10-CM

## 2023-10-07 DIAGNOSIS — M0579 Rheumatoid arthritis with rheumatoid factor of multiple sites without organ or systems involvement: Secondary | ICD-10-CM

## 2023-10-07 LAB — COMPREHENSIVE METABOLIC PANEL WITH GFR
ALT: 30 U/L (ref 0–44)
AST: 34 U/L (ref 15–41)
Albumin: 2.5 g/dL — ABNORMAL LOW (ref 3.5–5.0)
Alkaline Phosphatase: 29 U/L — ABNORMAL LOW (ref 38–126)
Anion gap: 11 (ref 5–15)
BUN: <5 mg/dL — ABNORMAL LOW (ref 8–23)
CO2: 21 mmol/L — ABNORMAL LOW (ref 22–32)
Calcium: 8.4 mg/dL — ABNORMAL LOW (ref 8.9–10.3)
Chloride: 100 mmol/L (ref 98–111)
Creatinine, Ser: 0.63 mg/dL (ref 0.44–1.00)
GFR, Estimated: >60 mL/min (ref >60)
Glucose, Bld: 101 mg/dL — ABNORMAL HIGH (ref 70–99)
Potassium: 3.8 mmol/L (ref 3.5–5.1)
Sodium: 132 mmol/L — ABNORMAL LOW (ref 135–145)
Total Bilirubin: 0.7 mg/dL (ref 0.0–1.2)
Total Protein: 5.6 g/dL — ABNORMAL LOW (ref 6.5–8.1)

## 2023-10-07 LAB — CULTURE, BLOOD (ROUTINE X 2)
Culture: NO GROWTH
Culture: NO GROWTH

## 2023-10-07 LAB — CBC
HCT: 31.6 % — ABNORMAL LOW (ref 36.0–46.0)
Hemoglobin: 10.8 g/dL — ABNORMAL LOW (ref 12.0–15.0)
MCH: 34.8 pg — ABNORMAL HIGH (ref 26.0–34.0)
MCHC: 34.2 g/dL (ref 30.0–36.0)
MCV: 101.9 fL — ABNORMAL HIGH (ref 80.0–100.0)
Platelets: 210 10*3/uL (ref 150–400)
RBC: 3.1 MIL/uL — ABNORMAL LOW (ref 3.87–5.11)
RDW: 16.2 % — ABNORMAL HIGH (ref 11.5–15.5)
WBC: 6.2 10*3/uL (ref 4.0–10.5)
nRBC: 0.3 % — ABNORMAL HIGH (ref 0.0–0.2)

## 2023-10-07 LAB — HEMOGLOBIN AND HEMATOCRIT, BLOOD
HCT: 30.1 % — ABNORMAL LOW (ref 36.0–46.0)
Hemoglobin: 10.3 g/dL — ABNORMAL LOW (ref 12.0–15.0)

## 2023-10-07 MED ORDER — OXIDIZED CELLULOSE EX PADS
1.0000 | MEDICATED_PAD | Freq: Once | CUTANEOUS | Status: AC
  Administered 2023-10-07: 1 via TOPICAL
  Filled 2023-10-07: qty 1

## 2023-10-07 MED ORDER — DOXYCYCLINE HYCLATE 100 MG PO TABS: 100.0000 mg | ORAL_TABLET | Freq: Two times a day (BID) | ORAL | 0 refills | Status: DC

## 2023-10-07 NOTE — Progress Notes (Signed)
 Physical Therapy Treatment Patient Details Name: Sherri [name]  KATHERINNE TERLECKI MRN: [id number] DOB: [birth year] Today's Date: 10/07/2023   History of Present Illness 83 y.o. female admitted 10/02/23 after fall at home with RLE edema. Pt with cellulitis and metabolic encephalopathy. PMhx: multiple sclerosis in remission, RA, HTN, HLD, chronic pain, anxiety, skin CA, HOH, cervical CA, Lt THA, TSA, cervical fusion    PT Comments  Pt making steady progress. Better able to sequence gait today although still unable to place significant wt on RLE due to pain. Patient will benefit from continued inpatient follow up therapy, <3 hours/day.    If plan is discharge home, recommend the following: A little help with walking and/or transfers;A lot of help with bathing/dressing/bathroom;Assist for transportation;Assistance with cooking/housework;Supervision due to cognitive status   Can travel by private vehicle     Yes  Equipment Recommendations  None recommended by PT    Recommendations for Other Services       Precautions / Restrictions Precautions Precautions: Fall Recall of Precautions/Restrictions: Impaired Restrictions Weight Bearing Restrictions Per Provider Order: No     Mobility  Bed Mobility               General bed mobility comments: Pt sitting EOB    Transfers Overall transfer level: Needs assistance Equipment used: Rolling walker (2 wheels) Transfers: Sit to/from Stand Sit to Stand: Contact guard assist           General transfer comment: Assist for safety and verbal cues for hand placement    Ambulation/Gait Ambulation/Gait assistance: Min assist Gait Distance (Feet): 20 Feet Assistive device: Rolling walker (2 wheels) Gait Pattern/deviations: Step-to pattern, Decreased stance time - right, Decreased weight shift to right, Antalgic Gait velocity: decr Gait velocity interpretation: <1.31 ft/sec, indicative of household ambulator   General Gait Details: Assist for  balance.   Stairs             Wheelchair Mobility     Tilt Bed    Modified Rankin (Stroke Patients Only)       Balance Overall balance assessment: Needs assistance Sitting-balance support: Feet supported, Bilateral upper extremity supported Sitting balance-Leahy Scale: Fair     Standing balance support: Bilateral upper extremity supported, Reliant on assistive device for balance, During functional activity Standing balance-Leahy Scale: Poor Standing balance comment: RW and CGA for static standing                            Communication Communication Communication: Impaired Factors Affecting Communication: Hearing impaired  Cognition Arousal: Alert Behavior During Therapy: Flat affect   PT - Cognitive impairments: Memory, Awareness, Orientation   Orientation impairments: Time                     Following commands: Impaired Following commands impaired: Only follows one step commands consistently    Cueing    Exercises      General Comments General comments (skin integrity, edema, etc.): VSS      Pertinent Vitals/Pain Pain Assessment Pain Assessment: Faces Faces Pain Scale: Hurts even more Pain Location: RLE Pain Descriptors / Indicators: Aching, Sore, Guarding Pain Intervention(s): Monitored during session, Limited activity within patient's tolerance, Repositioned    Home Living                          Prior Function            PT Goals (current goals  can now be found in the care plan section) Progress towards PT goals: Progressing toward goals    Frequency    Min 2X/week      PT Plan      Co-evaluation              AM-PAC PT "6 Clicks" Mobility   Outcome Measure  Help needed turning from your back to your side while in a flat bed without using bedrails?: A Little Help needed moving from lying on your back to sitting on the side of a flat bed without using bedrails?: A Little Help needed  moving to and from a bed to a chair (including a wheelchair)?: A Little Help needed standing up from a chair using your arms (e.g., wheelchair or bedside chair)?: A Little Help needed to walk in hospital room?: A Little Help needed climbing 3-5 steps with a railing? : Total 6 Click Score: 16    End of Session Equipment Utilized During Treatment: Gait belt Activity Tolerance: Patient tolerated treatment well Patient left: in chair;with call bell/phone within reach;with chair alarm set Nurse Communication: Mobility status PT Visit Diagnosis: Other abnormalities of gait and mobility (R26.89);Muscle weakness (generalized) (M62.81)     Time: 1610-9604 PT Time Calculation (min) (ACUTE ONLY): 14 min  Charges:    $Gait Training: 8-22 mins PT General Charges $$ ACUTE PT VISIT: 1 Visit                     [facility] PT Acute Rehabilitation Services Office [phone]     Pura Browns [facility] 10/07/2023, 5:03 PM

## 2023-10-07 NOTE — Discharge Summary (Signed)
 Physician Discharge Summary   Patient: Sherri [name] MRN: [id number] DOB: Feb 19, 1941  Admit date:     10/02/2023  Discharge date: 10/07/23  Discharge Physician: Jodeane Mulligan   PCP: Colene Dauphin, MD   Recommendations at discharge:    Pt to be discharged to SNF.   If you experience worsening fever, chills, chest pain, shortness of breath, or other concerning symptoms, please call your PCP or go to the emergency department immediately.  Discharge Diagnoses: Principal Problem:   Sepsis due to cellulitis [facility]) Active Problems:   Hyponatremia   Cellulitis of right lower extremity   Multiple sclerosis (HCC)   Essential hypertension   Rheumatoid arthritis (HCC)   Fall at home, initial encounter   Cellulitis   Pressure injury of skin  Resolved Problems:   * No resolved hospital problems. *   Hospital Course:  83 year old with a history of MS in remission, RA on chronic prednisone  MTX and [name] , HTN, HLD, chronic pain, and anxiety who presented to the ER from home with 2 days of right leg swelling and erythema after a mechanical fall at home. She has an unrelated persistent open wound of her right lower extremity following a squamous cell carcinoma excision for which she has been seeing a Dermatologist in the outpatient setting. Notes on 5/2 suggest the wound was improving. In the ER CT head was without acute findings but did note extensive chronic microvascular change. CT right lower extremity showed an indeterminant popliteal vein focal dilation with no clear filling defect with similar findings of the mid femoral vein. There was however no evidence of fracture or subcutaneous emphysema or abscess.   Assessment and Plan:  Cellulitis of right lower extremity venous insufficiency - Slow healing wound related to excision of squamous cell carcinoma.  Doppler ultrasound ruling out DVT.  Responded well to inpatient IV broad-spectrum antibiotic coverage.  Transition to p.o. doxycycline .   Twice daily wound care.  Wound of boot to provide more consistent compression to help with venous insufficiency.  Continue to elevate foot when able.  Sepsis secondary to above - Tachycardia, tachypnea, cellulitis on presentation given concern for sepsis.  Was given volume resuscitation, blood cultures, empiric antibiotics.  Appears resolved at this time.  Acute metabolic cephalopathy - Mild confusion on presentation.  Secondary to above.  Appears to be resolved.  Mild hyponatremia - Likely from hypovolemia, low sodium intake.  Resolved after fluid resuscitation.  Hypokalemia - Resolved after replenishment.  Rheumatoid arthritis - Followed by rheumatology in outpatient setting.  Biologics/methotrexate  on hold while inpatient.  Okay to resume upon discharge.  Resume normal medication regimen.  Physical debilitation and muscle weakness with fall at home - Evaluated by PT/OT.  Recommending STR/SNF.    Consultants: None Procedures performed: None Disposition: Skilled nursing facility Diet recommendation:  Discharge Diet Orders (From admission, onward)     Start     Ordered   10/07/23 0000  Diet - low sodium heart healthy        10/07/23 1044           Cardiac diet  DISCHARGE MEDICATION: Allergies as of 10/07/2023       Reactions   Darvon [propoxyphene Hcl] Shortness Of Breath   ? Dose Related ? Lowered respirations greatly   Demerol [meperidine] Shortness Of Breath, Other (See Comments)   Respiratory Distress   Penicillins Hives, Other (See Comments)   Has patient had a PCN reaction causing immediate rash, facial/tongue/throat swelling, SOB or lightheadedness with hypotension:  No SEVERE RASH INVOLVING MUCUS MEMBRANES or SKIN NECROSIS: #  #  #  YES  #  #  #  Has patient had a PCN reaction that required hospitalization No Has patient had a PCN reaction occurring within the last 10 years: No.   Imuran [azathioprine] Other (See Comments)   Weakness   Sulfa  Antibiotics  Nausea Only, Other (See Comments)   States had severe indigestion and heartburn and nausea and had to stop taking   Arava [leflunomide] Other (See Comments)   Excessive weight gain   Lipitor [atorvastatin ] Nausea And Vomiting   Currently taking 2 times weekly - able to tolerate low dose    Vytorin [ezetimibe-simvastatin] Nausea And Vomiting        Medication List     TAKE these medications    atorvastatin  10 MG tablet Commonly known as: LIPITOR TAKE 1 TAB ON MONDAYS AND FRIDAY   Biotin  5000 MCG Tabs Take 10,000 mcg by mouth every evening.   carvedilol  6.25 MG tablet Commonly known as: COREG  Take 1 tablet (6.25 mg total) by mouth 2 (two) times daily with a meal.   cholecalciferol  25 MCG (1000 UNIT) tablet Commonly known as: VITAMIN D3 Take 1,000 Units by mouth in the morning.   cyclobenzaprine  10 MG tablet Commonly known as: FLEXERIL  TAKE 1 TABLET BY MOUTH THREE TIMES A DAY AS NEEDED FOR MUSCLE SPASM   diazepam  5 MG tablet Commonly known as: VALIUM  TAKE 1 TABLET BY MOUTH AT BEDTIME AS NEEDED FOR MUSCLE SPASMS.   diclofenac sodium 1 % Gel Commonly known as: VOLTAREN APPLY 3 GRAMS TO 3 LARGE JOINTS 3 TIMES A DAY AS NEEDED   doxycycline  100 MG tablet Commonly known as: VIBRA -TABS Take 1 tablet (100 mg total) by mouth every 12 (twelve) hours.   DULoxetine  30 MG capsule Commonly known as: CYMBALTA  TAKE 1 CAPSULE BY MOUTH EVERY DAY   folic acid  1 MG tablet Commonly known as: FOLVITE  Take 1 tablet (1 mg total) by mouth daily.   furosemide  20 MG tablet Commonly known as: LASIX  TAKE 1 TABLET BY MOUTH EVERY DAY   LUBRICATING EYE DROPS OP Place 1 drop into both eyes daily as needed (dry eyes).   methotrexate  2.5 MG tablet Commonly known as: RHEUMATREX Take 5 tablets (12.5 mg total) by mouth once a week. Caution:Chemotherapy. Protect from light.   pantoprazole  40 MG tablet Commonly known as: PROTONIX  Take 1 tablet (40 mg total) by mouth 2 (two) times daily  before a meal.   predniSONE  5 MG tablet Commonly known as: DELTASONE  TAKE 1 TABLET BY MOUTH EVERY DAY IN THE MORNING   Prolia  60 MG/ML Sosy injection Generic drug: denosumab  Inject 60 mg into the skin every 6 (six) months. Deliver to rheum: [address], [place], [place] [address]. Appt on 07/23/2022   [name]  15 MG Tb24 Generic drug: Upadacitinib  ER Take 15 mg by mouth daily.               Discharge Care Instructions  (From admission, onward)           Start     Ordered   10/07/23 0000  Discharge wound care:       Comments: Cleanse R lower leg wound with Vashe wound cleanser [employer] Foot [phone]) do not rinse and allow to air dry.  Apply vaseline gauze ([place] [phone]) to wound bed daily, cover with unna boot - change 2x a week when unna boot changed   10/07/23 1044  Discharge Exam: Filed Weights   10/02/23 1822  Weight: 54.4 kg    GENERAL:  Alert, pleasant, no acute distress  HEENT:  EOMI CARDIOVASCULAR:  RRR, no murmurs appreciated RESPIRATORY:  Clear to auscultation, no wheezing, rales, or rhonchi GASTROINTESTINAL:  Soft, nontender, nondistended EXTREMITIES: RLE with mild edema, erythema, tenderness.  Fresh bandage over anterior RLE. NEURO:  No new focal deficits appreciated SKIN:  No rashes noted PSYCH:  Appropriate mood and affect     Condition at discharge: improving  The results of significant diagnostics from this hospitalization (including imaging, microbiology, ancillary and laboratory) are listed below for reference.   Imaging Studies: VAS US  LOWER EXTREMITY VENOUS (DVT) Result Date: 10/05/2023  Lower Venous DVT Study Patient Name:  Samyuktha  Trudi Fus  Date of Exam:   10/05/2023 Medical Rec #: [id number]          Accession #:    [id number] Date of Birth: 01-10-1941          Patient Gender: F Patient Age:   76 years Exam Location:  [facility] Procedure:      VAS US  LOWER EXTREMITY VENOUS (DVT) Referring Phys: JEFFREY MCCLUNG  --------------------------------------------------------------------------------  Indications: Pain, Swelling, and Erythema. Other Indications: Cellulitis. Risk Factors: Mechanical fall 3 days ago, chronic open wound to RLE post squamous cell carcinoma excision. Limitations: Poor ultrasound/tissue interface, open wound and bandages. Comparison Study: No previous RLEV exams Performing Technologist: Jody Hill RVT, RDMS  Examination Guidelines: A complete evaluation includes B-mode imaging, spectral Doppler, color Doppler, and power Doppler as needed of all accessible portions of each vessel. Bilateral testing is considered an integral part of a complete examination. Limited examinations for reoccurring indications may be performed as noted. The reflux portion of the exam is performed with the patient in reverse Trendelenburg.  +---------+---------------+---------+-----------+----------+-------------------+ RIGHT    CompressibilityPhasicitySpontaneityPropertiesThrombus Aging      +---------+---------------+---------+-----------+----------+-------------------+ CFV      Full           Yes      Yes                                      +---------+---------------+---------+-----------+----------+-------------------+ SFJ      Full                                                             +---------+---------------+---------+-----------+----------+-------------------+ FV Prox  Full           Yes      Yes                                      +---------+---------------+---------+-----------+----------+-------------------+ FV Mid   Full           Yes      Yes                                      +---------+---------------+---------+-----------+----------+-------------------+ FV DistalFull           Yes      Yes                                      +---------+---------------+---------+-----------+----------+-------------------+  PFV      Full                                                              +---------+---------------+---------+-----------+----------+-------------------+ POP      Full           Yes      Yes                                      +---------+---------------+---------+-----------+----------+-------------------+ PTV                                                   unable to visualize +---------+---------------+---------+-----------+----------+-------------------+ PERO                                                  unable to visualize +---------+---------------+---------+-----------+----------+-------------------+   Right Technical Findings: Not visualized segments include peroneal and posterior tibial veins due to open wound/ bandage placement.  +----+---------------+---------+-----------+----------+--------------+ LEFTCompressibilityPhasicitySpontaneityPropertiesThrombus Aging +----+---------------+---------+-----------+----------+--------------+ CFV Full           Yes      Yes                                 +----+---------------+---------+-----------+----------+--------------+     Summary: RIGHT: - There is no evidence of deep vein thrombosis in the lower extremity. However, portions of this examination were limited- see technologist comments above.  - No cystic structure found in the popliteal fossa.  LEFT: - No evidence of common femoral vein obstruction.   *See table(s) above for measurements and observations. Electronically signed by Irvin Mantel on 10/05/2023 at 5:20:08 PM.    Final    CT EXTREMITY LOWER RIGHT W CONTRAST Result Date: 10/02/2023 CLINICAL DATA:  swelling, erythematous EXAM: CT OF THE LOWER RIGHT EXTREMITY WITH CONTRAST TECHNIQUE: Multidetector CT imaging of the lower right extremity was performed according to the standard protocol following intravenous contrast administration. RADIATION DOSE REDUCTION: This exam was performed according to the departmental dose-optimization program which includes automated  exposure control, adjustment of the mA and/or kV according to patient size and/or use of iterative reconstruction technique. CONTRAST:  75mL OMNIPAQUE  IOHEXOL  350 MG/ML SOLN COMPARISON:  CT abdomen pelvis 09/16/2022 FINDINGS: Bones/Joint/Cartilage No evidence of fracture, dislocation, or joint effusion. Mild lateral tibiofemoral compartment joint space narrowing. Mild to moderate medial tibiofemoral compartment joint space narrowing. Mild degenerative changes of the foot. No evidence of severe arthropathy. No aggressive appearing focal bone abnormality. Partially visualized healed right sacral insufficiency fracture. Ligaments Suboptimally assessed by CT. Muscles and Tendons Grossly unremarkable. Soft tissues Diffuse leg, ankle, foot subcutaneus soft tissue edema. No subcutaneus soft tissue emphysema. No organized fluid collection. Vascular: Severe atherosclerotic plaque with limited evaluation due to non angiography phase. Indeterminate popliteal vein focal dilatation (4:257) with no definite filling defect. Similar finding to lesser extent of the mid femoral vein (4:204). Visualized pelvis:  Urinary bladder filled with urine. IMPRESSION: 1. Indeterminate popliteal vein focal dilatation with no definite filling defect. Similar finding to lesser extent of the mid femoral vein. Recommend right lower extremity venous ultrasound for exclusion of underlying thrombus. 2. Severe atherosclerotic plaque with limited evaluation due to non angiography phase. 3. Diffuse leg, ankle, foot subcutaneus soft tissue edema. No subcutaneus soft tissue emphysema. 4.  No acute displaced fracture or dislocation. Electronically Signed   By: Morgane  Naveau M.D.   On: 10/02/2023 22:34   CT Head Wo Contrast Result Date: 10/02/2023 CLINICAL DATA:  Mental status change EXAM: CT HEAD WITHOUT CONTRAST TECHNIQUE: Contiguous axial images were obtained from the base of the skull through the vertex without intravenous contrast. RADIATION DOSE  REDUCTION: This exam was performed according to the departmental dose-optimization program which includes automated exposure control, adjustment of the mA and/or kV according to patient size and/or use of iterative reconstruction technique. COMPARISON:  CT head 09/16/2022. FINDINGS: Brain: No acute intracranial hemorrhage. No CT evidence of acute infarct. Nonspecific hypoattenuation in the periventricular and subcortical white matter favored to reflect chronic microvascular ischemic changes. No edema, mass effect, or midline shift. The basilar cisterns are patent. Ventricles: Prominence of the ventricles suggesting underlying parenchymal volume loss. Vascular: Atherosclerotic calcifications of the carotid siphons and intracranial vertebral arteries. No hyperdense vessel. Skull: No acute or aggressive finding. Orbits: Orbits are symmetric. Sinuses: Similar postsurgical changes of the paranasal sinuses. Other: Trace fluid in the mastoid air cells. IMPRESSION: No CT evidence of acute intracranial abnormality. Extensive chronic microvascular ischemic changes. Mild parenchymal volume loss. Electronically Signed   By: Denny Flack M.D.   On: 10/02/2023 21:18   DG Chest Port 1 View Result Date: 10/02/2023 CLINICAL DATA:  Questionable sepsis - evaluate for abnormality, fever EXAM: PORTABLE CHEST - 1 VIEW COMPARISON:  June 20, 2021 FINDINGS: Elevation of the right hemidiaphragm. Biapical pleural thickening. No focal airspace consolidation, pleural effusion, or pneumothorax. No cardiomegaly. Tortuous aorta with aortic atherosclerosis. No acute fracture or destructive lesions. Multilevel thoracic osteophytosis. Cervical fusion hardware. Osteopenia. IMPRESSION: No acute cardiopulmonary abnormality. Electronically Signed   By: Rance Burrows M.D.   On: 10/02/2023 19:06    Microbiology: Results for orders placed or performed during the hospital encounter of 10/02/23  Blood Culture (routine x 2)     Status: None    Collection Time: 10/02/23  5:48 PM   Specimen: BLOOD  Result Value Ref Range Status   Specimen Description BLOOD LEFT ANTECUBITAL  Final   Special Requests   Final    BOTTLES DRAWN AEROBIC AND ANAEROBIC Blood Culture results may not be optimal due to an inadequate volume of blood received in culture bottles   Culture   Final    NO GROWTH 5 DAYS Performed at [facility], [address] [place], [place] [address]    Report Status 10/07/2023 FINAL  Final  Blood Culture (routine x 2)     Status: None   Collection Time: 10/02/23  6:01 PM   Specimen: BLOOD  Result Value Ref Range Status   Specimen Description BLOOD RIGHT ANTECUBITAL  Final   Special Requests   Final    BOTTLES DRAWN AEROBIC AND ANAEROBIC Blood Culture results may not be optimal due to an inadequate volume of blood received in culture bottles   Culture   Final    NO GROWTH 5 DAYS Performed at [facility], [address] [place], [place] [address]    Report Status 10/07/2023  FINAL  Final  MRSA Next Gen by PCR, Nasal     Status: None   Collection Time: 10/04/23  8:29 AM   Specimen: Nasal Mucosa; Nasal Swab  Result Value Ref Range Status   MRSA by PCR Next Gen NOT DETECTED NOT DETECTED Final    Comment: (NOTE) The GeneXpert MRSA Assay (FDA approved for NASAL specimens only), is one component of a comprehensive MRSA colonization surveillance program. It is not intended to diagnose MRSA infection nor to guide or monitor treatment for MRSA infections. Test performance is not FDA approved in patients less than 75 years old. Performed at [facility], [address], [place], [place] [address]    *Note: Due to a large number of results and/or encounters for the requested time period, some results have not been displayed. A complete set of results can be found in Results Review.    Labs: CBC: Recent Labs  Lab 10/02/23 1748 10/02/23 1801 10/04/23 0759 10/06/23 0644 10/07/23 0624  WBC 5.1  --   7.1 4.6 6.2  NEUTROABS 4.4  --   --   --   --   HGB 12.4 12.2 10.5* 10.9* 10.8*  HCT 35.3* 36.0 30.0* 31.2* 31.6*  MCV 102.3*  --  103.1* 100.0 101.9*  PLT 215  --  190 200 210   Basic Metabolic Panel: Recent Labs  Lab 10/02/23 1748 10/02/23 1801 10/04/23 0759 10/05/23 0638 10/06/23 0644 10/06/23 0939 10/07/23 0624  NA 125* 123* 129* 130* 130*  --  132*  K 3.6 3.6 3.6 3.2* 3.0*  --  3.8  CL 90*  --  97* 97* 93*  --  100  CO2 26  --  19* 24 27  --  21*  GLUCOSE 84  --  77 92 91  --  101*  BUN 11  --  9 7* <5*  --  <5*  CREATININE 1.05*  --  0.75 0.61 0.61  --  0.63  CALCIUM  9.4  --  8.5* 8.1* 8.2*  --  8.4*  MG  --   --   --   --   --  1.8  --    Liver Function Tests: Recent Labs  Lab 10/02/23 1748 10/04/23 0759 10/07/23 0624  AST 77* 110* 34  ALT 25 40 30  ALKPHOS 25* 31* 29*  BILITOT 1.3* 0.8 0.7  PROT 6.6 5.7* 5.6*  ALBUMIN 3.6 2.8* 2.5*   CBG: No results for input(s): "GLUCAP" in the last 168 hours.  Discharge time spent: 24 minutes.  Signed: Jodeane Mulligan, DO [facility] 10/07/2023

## 2023-10-07 NOTE — Plan of Care (Signed)

## 2023-10-07 NOTE — Plan of Care (Signed)
 Patient AAOx4, forgetful. Dressing changed done, Unna boot placed. PRN pain medication given with + effect. Safety precautions maintained.    Report given at 1353. BM needed.   1545: Enema given and BM had. [facility] notified and PT[facility] called.   1840: Patient fell. Gash/skin tear to arm. Vitals taken. MD [place] notified. Steri strips and dressing placed. Daughter notified.   Problem: Education: Goal: Knowledge of General Education information will improve Description: Including pain rating scale, medication(s)/side effects and non-pharmacologic comfort measures Outcome: Progressing   Problem: Pain Managment: Goal: General experience of comfort will improve and/or be controlled Outcome: Progressing   Problem: Safety: Goal: Ability to remain free from injury will improve Outcome: Progressing   Problem: Skin Integrity: Goal: Risk for impaired skin integrity will decrease Outcome: Progressing

## 2023-10-07 NOTE — Progress Notes (Signed)
 Occupational Therapy Treatment Patient Details Name: Sherri [name]  ZALENA HOLTZINGER MRN: [id number] DOB: 1940/10/15 Today's Date: 10/07/2023   History of present illness 83 y.o. female admitted 10/02/23 after fall at home with RLE edema. Pt with cellulitis and metabolic encephalopathy. PMhx: multiple sclerosis in remission, RA, HTN, HLD, chronic pain, anxiety, skin CA, HOH, cervical CA, Lt THA, TSA, cervical fusion   OT comments  Pt progressing toward goals this session, able to perform bed mobility with up to CGA, CGA for step pivot transfer to [facility] with RW. Pt able to perform seated pericare and grooming task with minA. Pt putting minimal weight on RLE with transfers, able to walk ~83ft forward toward HOB. Pt presenting with impairments listed below, will follow acutely. Patient will benefit from continued inpatient follow up therapy, <3 hours/day to maximize safety/ind with ADL/functional mobility.       If plan is discharge home, recommend the following:  A lot of help with walking and/or transfers;A lot of help with bathing/dressing/bathroom;Assistance with cooking/housework;Assist for transportation;Help with stairs or ramp for entrance   Equipment Recommendations  Other (comment) (defer)    Recommendations for Other Services      Precautions / Restrictions Precautions Precautions: Fall Recall of Precautions/Restrictions: Impaired Restrictions Weight Bearing Restrictions Per Provider Order: No       Mobility Bed Mobility Overal bed mobility: Needs Assistance Bed Mobility: Supine to Sit     Supine to sit: Supervision, HOB elevated, Used rails Sit to supine: Contact guard assist        Transfers Overall transfer level: Needs assistance Equipment used: Rolling walker (2 wheels) Transfers: Sit to/from Stand Sit to Stand: Contact guard assist                 Balance Overall balance assessment: Needs assistance Sitting-balance support: Feet supported, Bilateral upper extremity  supported Sitting balance-Leahy Scale: Fair Sitting balance - Comments: EOb without support   Standing balance support: Bilateral upper extremity supported, Reliant on assistive device for balance, During functional activity Standing balance-Leahy Scale: Poor Standing balance comment: Rw in standing                           ADL either performed or assessed with clinical judgement   ADL Overall ADL's : Needs assistance/impaired                         Toilet Transfer: Minimal assistance;Stand-pivot;BSC/3in1;Rolling walker (2 wheels)           Functional mobility during ADLs: Minimal assistance;Rolling walker (2 wheels)      Extremity/Trunk Assessment Upper Extremity Assessment Upper Extremity Assessment: Generalized weakness   Lower Extremity Assessment Lower Extremity Assessment: Defer to PT evaluation        Vision   Vision Assessment?: No apparent visual deficits   Perception Perception Perception: Not tested   Praxis Praxis Praxis: Not tested   Communication Communication Communication: Impaired Factors Affecting Communication: Hearing impaired   Cognition Arousal: Alert Behavior During Therapy: Flat affect Cognition: Cognition impaired, No family/caregiver present to determine baseline   Orientation impairments: Time, Place, Person Awareness: Intellectual awareness impaired, Online awareness impaired Memory impairment (select all impairments): Short-term memory, Working memory Attention impairment (select first level of impairment): Alternating attention Executive functioning impairment (select all impairments): Initiation, Organization, Sequencing, Reasoning, Problem solving                   Following commands: Impaired Following commands  impaired: Follows one step commands inconsistently, Follows one step commands with increased time      Cueing      Exercises      Shoulder Instructions       General Comments  VSS    Pertinent Vitals/ Pain       Pain Assessment Pain Assessment: Faces Pain Score: 6  Faces Pain Scale: Hurts even more Pain Location: RLE Pain Descriptors / Indicators: Aching, Sore, Guarding Pain Intervention(s): Limited activity within patient's tolerance, Monitored during session, Repositioned  Home Living                                          Prior Functioning/Environment              Frequency  Min 2X/week        Progress Toward Goals  OT Goals(current goals can now be found in the care plan section)  Progress towards OT goals: Progressing toward goals  Acute Rehab OT Goals Patient Stated Goal: none stated OT Goal Formulation: With patient Time For Goal Achievement: 10/17/23 Potential to Achieve Goals: Good ADL Goals Pt Will Perform Grooming: with modified independence;sitting;standing Pt Will Perform Upper Body Bathing: with modified independence;sitting;standing Pt Will Perform Lower Body Bathing: with modified independence;sit to/from stand Pt Will Transfer to Toilet: with modified independence;ambulating Pt Will Perform Tub/Shower Transfer: with modified independence;ambulating;rolling walker Additional ADL Goal #1: Pt will be able to gather ADL items with no LOB  Plan      Co-evaluation                 AM-PAC OT "6 Clicks" Daily Activity     Outcome Measure   Help from another person eating meals?: A Little Help from another person taking care of personal grooming?: A Little Help from another person toileting, which includes using toliet, bedpan, or urinal?: A Little Help from another person bathing (including washing, rinsing, drying)?: A Little Help from another person to put on and taking off regular upper body clothing?: A Little Help from another person to put on and taking off regular lower body clothing?: A Lot 6 Click Score: 17    End of Session Equipment Utilized During Treatment: Gait belt;Rolling walker  (2 wheels)  OT Visit Diagnosis: Unsteadiness on feet (R26.81);Other abnormalities of gait and mobility (R26.89);Repeated falls (R29.6);Muscle weakness (generalized) (M62.81);Pain Pain - Right/Left: Right Pain - part of body: Leg   Activity Tolerance Patient tolerated treatment well   Patient Left in bed;with call bell/phone within reach;with bed alarm set (for enema)   Nurse Communication Mobility status        Time: 4098-1191 OT Time Calculation (min): 15 min  Charges: OT General Charges $OT Visit: 1 Visit OT Treatments $Self Care/Home Management : 8-22 mins  Jolean Madariaga K, OTD, OTR/L SecureChat Preferred Acute Rehab [phone]   Benedict Brain Koonce 10/07/2023, 4:23 PM

## 2023-10-07 NOTE — Plan of Care (Deleted)
 Patient AAOx4, forgetful. Dressing changed done, Unna boot placed. PRN pain medication given with + effect. Safety precautions maintained.   Report given at 1353. BM needed.  1545: Enema given and BM had. [facility] notified and [facility] called.  1840: Patient fell. Gash/skin tear to arm. Vitals taken. MD Calkin notified. Steri strips and dressing placed. Daughter notified.

## 2023-10-07 NOTE — Progress Notes (Signed)
 Orthopedic Tech Progress Note Patient Details:  Sherri [name] [birth year] [id number]  Ortho Devices Type of Ortho Device: Ace wrap, Unna boot Ortho Device/Splint Location: RLE Ortho Device/Splint Interventions: Ordered, Application, Adjustment  Applied with daughter a bedside    Post Interventions Patient Tolerated: Fair Instructions Provided: Care of device  Kermitt Pedlar 10/07/2023, 12:03 PM

## 2023-10-07 NOTE — Progress Notes (Signed)
 2250 Wound present to RT Arm( Just below RT Elbow) Noted with dressing Soiled with moderate bright red blood.  Wound undressed, cleaned with NS & re-dressed. Oxidized Surgicel Pad, Guaze, & ABD pad applied & covered with Kerlix.  No further bleed noted at this time. Limited movement obs to RUE with moderate pain 5/10 reported during movements.  0700 Pt very anxious, agitated, confused & makes several attempts to get out of bed.  Tele-sitter ordered.

## 2023-10-07 NOTE — Progress Notes (Signed)
 TRH night cross cover note:   I was notified by the patient's RN  that the patient is experiencing bleeding from the right elbow after fall during day shift. Compression dressing being applied. She on prophylactic Lovenox 40 mg SQ daily, with next dose to occur tomorrow morning, but is otherwise not on any blood thinners at this time.  Per brief chart review, mild tachycardia noted, with most recent heart rates in the low 100s.  Other vital signs appear stable, including most recent blood pressures in the 140s to 150s mmHg, afebrile, respiratory rate 18, oxygen saturations in the mid to high 90s on room air.  Most recent hemoglobin was 10.8 when checked earlier this morning.  I subsequently added orders for thrombi pad, type and screen, H&H x 1 now, and CBC in the AM.    Update: Hemostasis achieved via thrombi pad.  I subsequently evaluated the patient at bedside, who is complaining of some right elbow discomfort, in the absence of any acute sensory changes to the right upper extremity.  Photos of the right elbow prior to dressing have been uploaded to patient's chart by day shift RN. I have placed wound care consult for assistance with wound care instructions relating to this significant abrasion associated with her right elbow.  Of also ordered plain films of the right elbow to evaluate her acute right elbow discomfort in the setting of fall around 1800 on 5/14.  I have confirmed that she has an existing order for prn oxycodone  for pain.    Camelia Cavalier, DO Hospitalist

## 2023-10-07 NOTE — TOC Progression Note (Signed)
 Transition of Care [facility]) - Progression Note    Patient Details  Name: Sherri  MODIE [name] MRN: [id number] Date of Birth: [birth year]  Transition of Care [facility]) CM/SW Contact  Terrah Decoster A [place], LCSW Phone Number: 10/07/2023, 9:00 AM  Clinical Narrative:     [address] [id number] Reference ID [id number]  Approval Dates:  10/07/2023-10/09/2023  CSW reached out to [facility] regarding bed availability today.   Following up wit provider regarding medical stability.   TOC will continue to follow.   Expected Discharge Plan: Skilled Nursing Facility Barriers to Discharge: Continued Medical Work up  Expected Discharge Plan and Services In-house Referral: Clinical Social Work   Post Acute Care Choice: Skilled Nursing Facility Living arrangements for the past 2 months: Single Family Home                                       Social Determinants of Health (SDOH) Interventions SDOH Screenings   Food Insecurity: No Food Insecurity (10/03/2023)  Housing: Low Risk  (10/04/2023)  Transportation Needs: No Transportation Needs (10/03/2023)  Utilities: Not At Risk (10/03/2023)  Alcohol  Screen: Low Risk  (02/21/2022)  Depression (PHQ2-9): Low Risk  (07/27/2023)  Financial Resource Strain: Low Risk  (02/21/2022)  Physical Activity: Inactive (02/21/2022)  Social Connections: Moderately Isolated (10/03/2023)  Stress: Stress Concern Present (02/21/2022)  Tobacco Use: Medium Risk (10/05/2023)    Readmission Risk Interventions     No data to display

## 2023-10-07 NOTE — TOC Transition Note (Signed)
 Transition of Care [facility]) - Discharge Note   Patient Details  Name: Sherri [name]  JEANNEAN PORTEOUS MRN: [id number] Date of Birth: 07-20-1940  Transition of Care [facility]) CM/SW Contact:  Pernie Grosso A [place], LCSW Phone Number: 10/07/2023, 3:28 PM   Clinical Narrative:     Patient will DC to: [facility]  Anticipated DC date: 10/09/23  Family notified: Chryl Crank   Transport by: Dortha Gauss ID [id number] Reference ID [id number]   Approval Dates:  10/07/2023-10/09/2023    Per MD patient ready for DC to [facility] . RN, patient, patient's family, and facility notified of DC. Discharge Summary and FL2 sent to facility. RN to call report prior to discharge ([phone]). DC packet on chart. Ambulance transport requested for patient.     CSW will sign off for now as social work intervention is no longer needed. Please consult us  again if new needs arise.   Final next level of care: Skilled Nursing Facility Barriers to Discharge: Barriers Resolved   Patient Goals and CMS Choice Patient states their goals for this hospitalization and ongoing recovery are:: To get better   Choice offered to / list presented to : Adult Children      Discharge Placement              Patient chooses bed at: [facility] Patient to be transferred to facility by: [facility] Name of family member notified: Chryl Crank Patient and family notified of of transfer: 10/09/23  Discharge Plan and Services Additional resources added to the After Visit Summary for   In-house Referral: Clinical Social Work   Post Acute Care Choice: Skilled Nursing Facility                               Social Drivers of Health (SDOH) Interventions SDOH Screenings   Food Insecurity: No Food Insecurity (10/03/2023)  Housing: Low Risk  (10/04/2023)  Transportation Needs: No Transportation Needs (10/03/2023)  Utilities: Not At Risk (10/03/2023)  Alcohol  Screen: Low Risk  (02/21/2022)  Depression (PHQ2-9): Low Risk  (07/27/2023)   Financial Resource Strain: Low Risk  (02/21/2022)  Physical Activity: Inactive (02/21/2022)  Social Connections: Moderately Isolated (10/03/2023)  Stress: Stress Concern Present (02/21/2022)  Tobacco Use: Medium Risk (10/05/2023)     Readmission Risk Interventions     No data to display

## 2023-10-08 ENCOUNTER — Inpatient Hospital Stay (HOSPITAL_COMMUNITY)

## 2023-10-08 DIAGNOSIS — S52021A Displaced fracture of olecranon process without intraarticular extension of right ulna, initial encounter for closed fracture: Secondary | ICD-10-CM

## 2023-10-08 DIAGNOSIS — G9341 Metabolic encephalopathy: Secondary | ICD-10-CM

## 2023-10-08 LAB — CBC
HCT: 29.9 % — ABNORMAL LOW (ref 36.0–46.0)
Hemoglobin: 10.5 g/dL — ABNORMAL LOW (ref 12.0–15.0)
MCH: 35.7 pg — ABNORMAL HIGH (ref 26.0–34.0)
MCHC: 35.1 g/dL (ref 30.0–36.0)
MCV: 101.7 fL — ABNORMAL HIGH (ref 80.0–100.0)
Platelets: 218 10*3/uL (ref 150–400)
RBC: 2.94 MIL/uL — ABNORMAL LOW (ref 3.87–5.11)
RDW: 16.2 % — ABNORMAL HIGH (ref 11.5–15.5)
WBC: 10.2 10*3/uL (ref 4.0–10.5)
nRBC: 0.4 % — ABNORMAL HIGH (ref 0.0–0.2)

## 2023-10-08 LAB — TYPE AND SCREEN
ABO/RH(D): A POS
Antibody Screen: NEGATIVE

## 2023-10-08 NOTE — Plan of Care (Signed)
 Patient AAOx4, forgetful. Daughter at bedside. Dressing to right arm changed multiple times today. Splint and sling placed. PRN pain medication given with + effect. Safety precautions maintained.    Problem: Clinical Measurements: Goal: Ability to maintain clinical measurements within normal limits will improve Outcome: Progressing   Problem: Pain Managment: Goal: General experience of comfort will improve and/or be controlled Outcome: Progressing   Problem: Safety: Goal: Ability to remain free from injury will improve Outcome: Progressing   Problem: Skin Integrity: Goal: Risk for impaired skin integrity will decrease Outcome: Progressing

## 2023-10-08 NOTE — TOC Progression Note (Addendum)
 Transition of Care [facility]) - Progression Note    Patient Details  Name: Sherri  SHAREKIA [name] MRN: [id number] Date of Birth: September 04, 1940  Transition of Care [facility]) CM/SW Contact  Sula Fetterly A [place], LCSW Phone Number: 10/08/2023, 10:48 AM  Clinical Narrative:     Update 1216 Pt cannot DC today due to 24hr sitter rule at the facility. Needs DC of telesitter for at least 24 hrs before can DC. Bed still available at [facility].   CSW contacted [facility] regarding pt's bed availability and update on pt's potential DC given her recent fall. Left VM with contact # to reach back out to CSW.   TOC will continue to follow.   Expected Discharge Plan: Skilled Nursing Facility Barriers to Discharge: Barriers Resolved  Expected Discharge Plan and Services In-house Referral: Clinical Social Work   Post Acute Care Choice: Skilled Nursing Facility Living arrangements for the past 2 months: Single Family Home Expected Discharge Date: 10/07/23                                     Social Determinants of Health (SDOH) Interventions SDOH Screenings   Food Insecurity: No Food Insecurity (10/03/2023)  Housing: Low Risk  (10/04/2023)  Transportation Needs: No Transportation Needs (10/03/2023)  Utilities: Not At Risk (10/03/2023)  Alcohol  Screen: Low Risk  (02/21/2022)  Depression (PHQ2-9): Low Risk  (07/27/2023)  Financial Resource Strain: Low Risk  (02/21/2022)  Physical Activity: Inactive (02/21/2022)  Social Connections: Moderately Isolated (10/03/2023)  Stress: Stress Concern Present (02/21/2022)  Tobacco Use: Medium Risk (10/05/2023)    Readmission Risk Interventions     No data to display

## 2023-10-08 NOTE — Hospital Course (Addendum)
 83 year old with a history of MS in remission, RA on chronic prednisone  MTX and [name] , HTN, HLD, chronic pain, and anxiety who presented to the ER from home with 2 days of right leg swelling and erythema after a mechanical fall at home. She has an unrelated persistent open wound of her right lower extremity following a squamous cell carcinoma excision for which she has been seeing a Dermatologist in the outpatient setting. Notes on 5/2 suggest the wound was improving. In the ER CT head was without acute findings but did note extensive chronic microvascular change. CT right lower extremity showed an indeterminant popliteal vein focal dilation with no clear filling defect with similar findings of the mid femoral vein. There was however no evidence of fracture or subcutaneous emphysema or abscess.    Assessment and Plan:   Cellulitis of right lower extremity venous insufficiency - Slow healing wound related to excision of squamous cell carcinoma.  Doppler ultrasound ruling out DVT.  Responded well to inpatient IV broad-spectrum antibiotic coverage.  Transition to p.o. doxycycline .  Twice daily wound care.  Wound of boot to provide more consistent compression to help with venous insufficiency.  Continue to elevate foot when able.   Sepsis secondary to above - Tachycardia, tachypnea, cellulitis on presentation given concern for sepsis.  Was given volume resuscitation, blood cultures, empiric antibiotics.  Appears resolved at this time.   Acute metabolic cephalopathy - Appears to have intermittent delirium, possibility of some underlying dementia.  Waxes and wanes.  Try to avoid sitters or sedating medications to help facilitate patient transition out of the hospital.   Fall with acute right elbow skin tear and acute transverse olecranon fracture - Yesterday after patient discharge orders, patient had a ground-level fall.  Subsequently had a right elbow skin tear with initial difficulty in achieving  homeostasis.  Steri-Strips used to approximate, from the pad with good results.  Imaging ordered last night with results showing acute transverse olecranon fracture.  Orthopedic surgery consulted this morning.  Recommendations to keep elbow in splint, nonweightbearing, follow-up with Dr. Curtiss Dowdy next week.  No acute surgical intervention necessary at this time.   Mild hyponatremia - Likely from hypovolemia, low sodium intake.  Resolved after fluid resuscitation.   Hypokalemia - Resolved after replenishment.   Rheumatoid arthritis - Followed by rheumatology in outpatient setting.  Biologics/methotrexate  on hold while inpatient.  Okay to resume upon discharge.  Resume normal medication regimen.   Physical debilitation and muscle weakness with fall at home - Evaluated by PT/OT.  Recommending STR/SNF.  Was to be discharged yesterday however was delayed, see above.  Today awaiting evaluation by Ortho and resolution of mild confusion.  Likely discharge tomorrow.

## 2023-10-08 NOTE — Progress Notes (Signed)
 Progress Note   Patient: Sherri [name]  A [facility] ZOX:[id number] DOB: 10/10/1940 DOA: 10/02/2023     5 DOS: the patient was seen and examined on 10/08/2023   Brief hospital course:  83 year old with a history of MS in remission, RA on chronic prednisone  MTX and [name] , HTN, HLD, chronic pain, and anxiety who presented to the ER from home with 2 days of right leg swelling and erythema after a mechanical fall at home. She has an unrelated persistent open wound of her right lower extremity following a squamous cell carcinoma excision for which she has been seeing a Dermatologist in the outpatient setting. Notes on 5/2 suggest the wound was improving. In the ER CT head was without acute findings but did note extensive chronic microvascular change. CT right lower extremity showed an indeterminant popliteal vein focal dilation with no clear filling defect with similar findings of the mid femoral vein. There was however no evidence of fracture or subcutaneous emphysema or abscess.    Assessment and Plan:   Cellulitis of right lower extremity venous insufficiency - Slow healing wound related to excision of squamous cell carcinoma.  Doppler ultrasound ruling out DVT.  Responded well to inpatient IV broad-spectrum antibiotic coverage.  Transition to p.o. doxycycline .  Twice daily wound care.  Wound of boot to provide more consistent compression to help with venous insufficiency.  Continue to elevate foot when able.   Sepsis secondary to above - Tachycardia, tachypnea, cellulitis on presentation given concern for sepsis.  Was given volume resuscitation, blood cultures, empiric antibiotics.  Appears resolved at this time.   Acute metabolic cephalopathy - Appears to have intermittent delirium, possibility of some underlying dementia.  Waxes and wanes.  Try to avoid sitters or sedating medications to help facilitate patient transition out of the hospital.  Fall with acute right elbow skin tear and acute transverse  olecranon fracture - Yesterday after patient discharge orders, patient had a ground-level fall.  Subsequently had a right elbow skin tear with initial difficulty in achieving homeostasis.  Steri-Strips used to approximate, from the pad with good results.  Imaging ordered last night with results showing acute transverse olecranon fracture.  Orthopedic surgery consulted this morning.  Recommendations to keep elbow in splint, nonweightbearing, follow-up with Dr. Curtiss Dowdy next week.  No acute surgical intervention necessary at this time.   Mild hyponatremia - Likely from hypovolemia, low sodium intake.  Resolved after fluid resuscitation.   Hypokalemia - Resolved after replenishment.   Rheumatoid arthritis - Followed by rheumatology in outpatient setting.  Biologics/methotrexate  on hold while inpatient.  Okay to resume upon discharge.  Resume normal medication regimen.   Physical debilitation and muscle weakness with fall at home - Evaluated by PT/OT.  Recommending STR/SNF.  Was to be discharged yesterday however was delayed, see above.  Today awaiting evaluation by Ortho and resolution of mild confusion.  Likely discharge tomorrow.   Subjective: Patient was to be discharged yesterday however experienced ground-level fall last night prior to her being able to transition to STR.  Fall resulted in large right elbow skin tear with new olecranon fracture.  Patient is somewhat confused overnight as well.  This morning alert, conversant.  May be mildly confused, continually trying to get up.  Stating that she wants to leave the hospital.  Otherwise is conversant and orientable.  Admits to elbow pain but denies fever, chills, shortness of breath, chest pain, nausea, vomiting, abdominal pain.  Physical Exam: Vitals:   10/07/23 2136 10/08/23 8119 10/08/23 0827 10/08/23 1478  BP: (!) 158/83 (!) 143/90 (!) 147/92   Pulse: (!) 104 (!) 110 68   Resp: 17 17    Temp: 98.2 F (36.8 C) 99 F (37.2 C)  98 F (36.7  C)  TempSrc: Oral Oral  Oral  SpO2: 96% 100% (!) 86% 94%  Weight:      Height:        GENERAL:  Alert, pleasant, no acute distress  HEENT:  EOMI CARDIOVASCULAR:  RRR, no murmurs appreciated RESPIRATORY:  Clear to auscultation, no wheezing, rales, or rhonchi GASTROINTESTINAL:  Soft, nontender, nondistended EXTREMITIES: RLE with mild edema, erythema, tenderness.  Fresh bandage over anterior RLE.  Right elbow with fresh bandage, tender NEURO:  No new focal deficits appreciated SKIN:  No rashes noted PSYCH:  Appropriate mood and affect    Data Reviewed:  Right elbow 2 view imaging noting transverse olecranon fracture, closed  Labs: CBC: Recent Labs  Lab 10/02/23 1748 10/02/23 1801 10/04/23 0759 10/06/23 0644 10/07/23 0624 10/07/23 2221 10/08/23 1105  WBC 5.1  --  7.1 4.6 6.2  --  10.2  NEUTROABS 4.4  --   --   --   --   --   --   HGB 12.4   < > 10.5* 10.9* 10.8* 10.3* 10.5*  HCT 35.3*   < > 30.0* 31.2* 31.6* 30.1* 29.9*  MCV 102.3*  --  103.1* 100.0 101.9*  --  101.7*  PLT 215  --  190 200 210  --  218   < > = values in this interval not displayed.   Basic Metabolic Panel: Recent Labs  Lab 10/02/23 1748 10/02/23 1801 10/04/23 0759 10/05/23 1610 10/06/23 0644 10/06/23 0939 10/07/23 0624  NA 125* 123* 129* 130* 130*  --  132*  K 3.6 3.6 3.6 3.2* 3.0*  --  3.8  CL 90*  --  97* 97* 93*  --  100  CO2 26  --  19* 24 27  --  21*  GLUCOSE 84  --  77 92 91  --  101*  BUN 11  --  9 7* <5*  --  <5*  CREATININE 1.05*  --  0.75 0.61 0.61  --  0.63  CALCIUM  9.4  --  8.5* 8.1* 8.2*  --  8.4*  MG  --   --   --   --   --  1.8  --    Liver Function Tests: Recent Labs  Lab 10/02/23 1748 10/04/23 0759 10/07/23 0624  AST 77* 110* 34  ALT 25 40 30  ALKPHOS 25* 31* 29*  BILITOT 1.3* 0.8 0.7  PROT 6.6 5.7* 5.6*  ALBUMIN 3.6 2.8* 2.5*   CBG: No results for input(s): "GLUCAP" in the last 168 hours.  Scheduled Meds:  Chlorhexidine  Gluconate Cloth  6 each Topical Daily    cholecalciferol   1,000 Units Oral q AM   doxycycline   100 mg Oral Q12H   enoxaparin (LOVENOX) injection  40 mg Subcutaneous Q24H   folic acid   1 mg Oral Daily   pantoprazole   40 mg Oral BID AC   predniSONE   5 mg Oral Q breakfast   Continuous Infusions: PRN Meds:.acetaminophen  **OR** [DISCONTINUED] acetaminophen , bisacodyl , cyclobenzaprine , ondansetron  **OR** ondansetron  (ZOFRAN ) IV, oxyCODONE , senna-docusate  Family Communication: None at bedside  Disposition: Status is: Inpatient Remains inpatient appropriate because: Cellulitis and acute right elbow fracture     Time spent: 49 minutes  Author: Jodeane Mulligan, DO 10/08/2023 1:52 PM  For on call review [email or web address].

## 2023-10-08 NOTE — Consult Note (Addendum)
 WOC Nurse Consult Note: Reason for Consult: Consult requested for right arm abrasion; refer to previous progress notes.  This occurred after a patient fall and had a significant amt bleeding last night from the site.  Right arm near elbow with full thickness abrasion, 100% red, currently only a small amt bloody drainage. 8X1X.2cm. Flap has been replaced over 80% of the wound, 20% exposed full thickness wound.   Dressing procedure/placement/frequency: Topical treatment orders provided for bedside nurses to perform as follows: Leave steristrips in place to right arm wound until they fall off.  Apply Xeroform gauze and ABD pad and kerlex over the site and change Q THURS/SAT/MON. Please re-consult if further assistance is needed.  Thank-you,  Wiliam Harder MSN, RN, CWOCN, [place], CNS [phone]

## 2023-10-08 NOTE — Care Management Important Message (Signed)
 Important Message  Patient Details  Name: Sherri [name] MRN: [id number] Date of Birth: May 18, 1941   Important Message Given:  Yes - Medicare IM     Wynonia Hedges 10/08/2023, 3:25 PM

## 2023-10-08 NOTE — Consult Note (Signed)
 Reason for Consult:Right elbow fx Referring Physician: Pandora Bogaert Time called: 0935 Time at bedside: 0952   Sherri [name] is an 83 y.o. female.  HPI: Sherri [name]  fell yesterday and hurt her right elbow. She had a large skin tear that required some hemostatic gauze to control bleeding. She continued to c/o pain and x-rays showed an olecranon fx and orthopedic surgery was consulted. She is RHD and due to dc to SNF following sepsis 2/2 LE cellulitis.  Past Medical History:  Diagnosis Date   Anxiety    takes Valium  daily as needed   Basal cell carcinoma 01/21/1988   left nostril (MOHS), sup-right calf (CX35FU)   Basal cell carcinoma 03/22/1996   right calf (CX35FU)   Basal cell carcinoma 03/31/1995   left wing nose   Bruises easily    d/t meds   Cancer (HCC)    basal cell ca, in situ- uterine    Cataracts, bilateral    removed bilateral   Chronic back pain    Dizziness    r/t to meds   GERD (gastroesophageal reflux disease)    no meds on a regular basis but will take Tums if needed   Headache(784.0)    r/t neck issues   History of bronchitis 6-34yrs ago   History of colon polyps    benign   HOH (hard of hearing)    wears hearing aids   Hyperlipidemia    takes Atorvastatin  on Mondays and Fridays   Hypertension    Joint pain    Joint swelling    Multiple sclerosis (HCC)    Neuromuscular disorder (HCC)    Dr. Hobson Luna- [facility], follows [facility]   Nocturia    PONV (postoperative nausea and vomiting)    trouble urinating after surgery in 2014   Postoperative nausea and vomiting 01/11/2019   Rheumatoid arthritis (HCC)    Dr Voncille Guadalajara weekly, RA- hands- knees- feet    Rheumatoid arthritis(714.0)    Dr Alvira Josephs takes Xeljanz  daily   Right wrist fracture    Skin cancer    Squamous cell carcinoma of skin 11/02/2001   in situ-right knee (cx35fu)   Squamous cell carcinoma of skin 11/12/2011   in situ-left forearm (CX35FU), in situ-left foot (CX35FU)    Squamous cell carcinoma of skin 01/22/2012   in situ-left lower forearm (txpbx)   Squamous cell carcinoma of skin 11/17/2012   right shin (txpbx)   Squamous cell carcinoma of skin 10/28/2013   in situ-right shoulder (CX35FU)   Squamous cell carcinoma of skin 04/28/2014   well diff-right shin (txpbx)   Squamous cell carcinoma of skin 11/02/2014   in situ-Left shin (txpbx)   Squamous cell carcinoma of skin 12/15/2014   in situ-Left hand (txpbx), bowens-left side chest (txpbx)   Squamous cell carcinoma of skin 05/09/2015   in situ-left hand (txpbx)    Squamous cell carcinoma of skin 05/07/2016   in situ-left inner shin,ant, in situ-left inner shin, post, in situ-left outer forearm, in situ-right knuckle   Squamous cell carcinoma of skin 03/1302018   in situ-left shoulder (txpbx), in situ-right inner shin (txpbx), in situ-top of left foot (txpbx), KA- left forearm (txpbx)   Squamous cell carcinoma of skin 12/02/2016   in situ-left inner shin (txpbx)   Squamous cell carcinoma of skin 03/04/2017   in situ-left outer sup, shin (txpbx), in situ- right 2nd knuckle finger (txpbx), in situ- Left wrist (txpbx)   Squamous cell carcinoma of skin 04/06/2018   in situ-above left  knee inner (txpbx)   Squamous cell carcinoma of skin 04/21/2018   in situ-right top hand (txpbx)   Squamous cell carcinoma of skin 07/08/2018   in situ-right lower inner shin (txpbx)   Squamous cell carcinoma of skin 04/27/2019   in situ- Left neck(CX35FU), In situ- right neck (CX35FU)   Urinary retention    sees Dr.Wrenn about 2 times a yr    Past Surgical History:  Procedure Laterality Date   ABDOMINAL HYSTERECTOMY     1985   ANTERIOR CERVICAL DECOMP/DISCECTOMY FUSION N/A 04/23/2022   Procedure: Anterior Cervical Decompression/Discectomy Fusion - Cervical Seven-Thoracic One;  Surgeon: Agustina Aldrich, MD;  Location: [facility] OR;  Service: Neurosurgery;  Laterality: N/A;  3C   APPENDECTOMY     with TAH   BACK SURGERY      several   BREAST BIOPSY Right    Results were negative   BROW LIFT Bilateral 10/02/2016   Procedure: BILATERAL LOWER LID BLEPHAROPLASTY;  Surgeon: Karan Osgood, MD;  Location: [facility] OR;  Service: Plastics;  Laterality: Bilateral;   CARPAL TUNNEL RELEASE Right    cataracts     CERVICAL FUSION     Dr Rica Chalet   CERVICAL FUSION  12/31/2012   Dr Rica Chalet   CERVICAL FUSION  04/2022   CHEST TUBE INSERTION     for traumatic Pneumothorax   COLONOSCOPY  07/16/2010   normal    COLONOSCOPY W/ POLYPECTOMY  1997   negative since; Dr Arvie Latus   ESOPHAGOGASTRODUODENOSCOPY  07/16/2010   normal   eye lid raise     EYE SURGERY Bilateral    cataracts removed - /w IOL   HARDWARE REMOVAL  08/2021   JOINT REPLACEMENT     LUMBAR FUSION     Dr Rica Chalet   NASAL SINUS SURGERY     POSTERIOR CERVICAL FUSION/FORAMINOTOMY N/A 12/31/2012   Procedure: POSTERIOR LATERAL CERVICAL FUSION/FORAMINOTOMY LEVEL 1 CERVICAL THREE-FOUR WITH LATERAL MASS SCREWS;  Surgeon: Adelbert Adler, MD;  Location: [facility];  Service: Neurosurgery;  Laterality: N/A;   PTOSIS REPAIR Bilateral 10/02/2016   Procedure: INTERNAL PTOSIS REPAIR;  Surgeon: Karan Osgood, MD;  Location: [facility] OR;  Service: Plastics;  Laterality: Bilateral;   SKIN BIOPSY     THYROID  SURGERY     R lobe removed- 1972, has grown back - CT last done- 2018   TOTAL HIP ARTHROPLASTY  12/22/2011   Procedure: TOTAL HIP ARTHROPLASTY;  Surgeon: Ilean Mall, MD;  Location: [facility] OR;  Service: Orthopedics;  Laterality: Left;   TOTAL SHOULDER ARTHROPLASTY     TUBAL LIGATION     UPPER GASTROINTESTINAL ENDOSCOPY  2012   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   WRIST SURGERY Left 09/16/2022   wrist/arm    Family History  Problem Relation Age of Onset   Cancer Mother        pancreatic   Diabetes Mother    Pancreatic cancer Mother    Heart disease Father        Rheumatic   Cancer Father        ? stomach   Stomach cancer Father    Cancer Sister        stomach    Stomach cancer Sister    Cancer Maternal Aunt        X 4; ? primary   Heart disease Paternal Aunt    Cancer Maternal Grandmother        cervical   Colon cancer Other  Aunts   Multiple sclerosis Daughter    Hypertension Neg Hx    Stroke Neg Hx    Esophageal cancer Neg Hx    Rectal cancer Neg Hx     Social History:  reports that she quit smoking about 42 years ago. Her smoking use included cigarettes. She started smoking about 62 years ago. She has a 50 pack-year smoking history. She has never been exposed to tobacco smoke. She has never used smokeless tobacco. She reports current alcohol  use of about 14.0 standard drinks of alcohol  per week. She reports that she does not use drugs.  Allergies:  Allergies  Allergen Reactions   Darvon [Propoxyphene Hcl] Shortness Of Breath    ? Dose Related ? Lowered respirations greatly   Demerol [Meperidine] Shortness Of Breath and Other (See Comments)    Respiratory Distress   Penicillins Hives and Other (See Comments)    Has patient had a PCN reaction causing immediate rash, facial/tongue/throat swelling, SOB or lightheadedness with hypotension: No SEVERE RASH INVOLVING MUCUS MEMBRANES or SKIN NECROSIS: #  #  #  YES  #  #  #  Has patient had a PCN reaction that required hospitalization No Has patient had a PCN reaction occurring within the last 10 years: No.    Imuran [Azathioprine] Other (See Comments)    Weakness   Sulfa  Antibiotics Nausea Only and Other (See Comments)    States had severe indigestion and heartburn and nausea and had to stop taking   Arava [Leflunomide] Other (See Comments)    Excessive weight gain   Lipitor [Atorvastatin ] Nausea And Vomiting    Currently taking 2 times weekly - able to tolerate low dose    Vytorin [Ezetimibe-Simvastatin] Nausea And Vomiting    Medications: I have reviewed the patient's current medications.  Results for orders placed or performed during the hospital encounter of 10/02/23 (from the  past 48 hours)  Comprehensive metabolic panel with GFR     Status: Abnormal   Collection Time: 10/07/23  6:24 AM  Result Value Ref Range   Sodium 132 (L) 135 - 145 mmol/L   Potassium 3.8 3.5 - 5.1 mmol/L   Chloride 100 98 - 111 mmol/L   CO2 21 (L) 22 - 32 mmol/L   Glucose, Bld 101 (H) 70 - 99 mg/dL    Comment: Glucose reference range applies only to samples taken after fasting for at least 8 hours.   BUN <5 (L) 8 - 23 mg/dL   Creatinine, Ser 6.04 0.44 - 1.00 mg/dL   Calcium  8.4 (L) 8.9 - 10.3 mg/dL   Total Protein 5.6 (L) 6.5 - 8.1 g/dL   Albumin 2.5 (L) 3.5 - 5.0 g/dL   AST 34 15 - 41 U/L   ALT 30 0 - 44 U/L   Alkaline Phosphatase 29 (L) 38 - 126 U/L   Total Bilirubin 0.7 0.0 - 1.2 mg/dL   GFR, Estimated >54 >09 mL/min    Comment: (NOTE) Calculated using the CKD-EPI Creatinine Equation (2021)    Anion gap 11 5 - 15    Comment: Performed at [facility], [address], [place], [place] [address]  CBC     Status: Abnormal   Collection Time: 10/07/23  6:24 AM  Result Value Ref Range   WBC 6.2 4.0 - 10.5 K/uL   RBC 3.10 (L) 3.87 - 5.11 MIL/uL   Hemoglobin 10.8 (L) 12.0 - 15.0 g/dL   HCT 47.8 (L) 29.5 - 62.1 %   MCV  101.9 (H) 80.0 - 100.0 fL   MCH 34.8 (H) 26.0 - 34.0 pg   MCHC 34.2 30.0 - 36.0 g/dL   RDW 16.1 (H) 09.6 - 04.5 %   Platelets 210 150 - 400 K/uL   nRBC 0.3 (H) 0.0 - 0.2 %    Comment: Performed at [facility], [address], [place], [place] [address]  Type and screen [facility]     Status: None   Collection Time: 10/07/23 10:19 PM  Result Value Ref Range   ABO/RH(D) A POS    Antibody Screen NEG    Sample Expiration      10/10/2023,2359 Performed at [facility], [address], [place], [place] [address]   Hemoglobin and hematocrit, blood     Status: Abnormal   Collection Time: 10/07/23 10:21 PM  Result Value Ref Range   Hemoglobin 10.3 (L) 12.0 - 15.0 g/dL   HCT 82.9 (L) 56.2 - 13.0 %    Comment: Performed at [facility], [address] [place], [place] [address]   *Note: Due to a large number of results and/or encounters for the requested time period, some results have not been displayed. A complete set of results can be found in Results Review.    DG Elbow 2 Views Right Result Date: 10/08/2023 CLINICAL DATA:  Fall.  Elbow pain. EXAM: RIGHT ELBOW - 2 VIEW COMPARISON:  None Available. FINDINGS: Limited two view study shows a transverse fracture of the olecranon. No other acute fracture evident. No subluxation or dislocation. Lateral view is oblique, limiting assessment but anterior fat pad appears elevated suggesting joint effusion. IMPRESSION: Limited two view study demonstrates transverse fracture of the olecranon . Electronically Signed   By: Donnal Fusi M.D.   On: 10/08/2023 07:28    Review of Systems  HENT:  Negative for ear discharge, ear pain, hearing loss and tinnitus.   Eyes:  Negative for photophobia and pain.  Respiratory:  Negative for cough and shortness of breath.   Cardiovascular:  Negative for chest pain.  Gastrointestinal:  Negative for abdominal pain, nausea and vomiting.  Genitourinary:  Negative for dysuria, flank pain, frequency and urgency.  Musculoskeletal:  Positive for arthralgias (Right elbow). Negative for back pain, myalgias and neck pain.  Neurological:  Negative for dizziness and headaches.  Hematological:  Does not bruise/bleed easily.  Psychiatric/Behavioral:  The patient is not nervous/anxious.    Blood pressure (!) 147/92, pulse 68, temperature 98 F (36.7 C), temperature source Oral, resp. rate 17, height 5\' 1"  (1.549 m), weight 54.4 kg, SpO2 94%. Physical Exam Constitutional:      General: She is not in acute distress.    Appearance: She is well-developed. She is not diaphoretic.  HENT:     Head: Normocephalic and atraumatic.  Eyes:     General: No scleral icterus.       Right eye: No discharge.        Left eye: No discharge.     Conjunctiva/sclera:  Conjunctivae normal.  Cardiovascular:     Rate and Rhythm: Normal rate and regular rhythm.  Pulmonary:     Effort: Pulmonary effort is normal. No respiratory distress.  Musculoskeletal:     Cervical back: Normal range of motion.     Comments: Right shoulder, elbow, wrist, digits- Skin tear has been steri-stripped closed, no ST defect palpable to suggest open fx, mod TTP, no instability, no blocks to motion  Sens  Ax/R/M/U intact  Mot   Ax/ R/ PIN/ M/ AIN/ U intact  Rad 2+  Skin:    General: Skin is warm and dry.  Neurological:     Mental Status: She is alert.  Psychiatric:        Mood and Affect: Mood normal.        Behavior: Behavior normal.     Assessment/Plan: Right olecranon fx -- Plan initial non-operative management with splint and NWB. F/u with Dr. Curtiss Dowdy next week.    Georganna Kin, PA-C Orthopedic Surgery [phone] 10/08/2023, 10:14 AM

## 2023-10-08 NOTE — Progress Notes (Signed)
 Orthopedic Tech Progress Note Patient Details:  Almeta  A Kregel 12-16-1940 [id number]  Ortho Devices Type of Ortho Device: Long arm splint Ortho Device/Splint Location: RUE Ortho Device/Splint Interventions: Ordered, Application   Post Interventions Patient Tolerated: Well Instructions Provided: Care of device  Charlen Bakula A Brittanee Ghazarian 10/08/2023, 5:10 PM

## 2023-10-09 DIAGNOSIS — M810 Age-related osteoporosis without current pathological fracture: Secondary | ICD-10-CM | POA: Diagnosis not present

## 2023-10-09 DIAGNOSIS — I87311 Chronic venous hypertension (idiopathic) with ulcer of right lower extremity: Secondary | ICD-10-CM | POA: Diagnosis not present

## 2023-10-09 DIAGNOSIS — R1312 Dysphagia, oropharyngeal phase: Secondary | ICD-10-CM | POA: Diagnosis not present

## 2023-10-09 DIAGNOSIS — G894 Chronic pain syndrome: Secondary | ICD-10-CM | POA: Diagnosis not present

## 2023-10-09 DIAGNOSIS — Q782 Osteopetrosis: Secondary | ICD-10-CM | POA: Diagnosis not present

## 2023-10-09 DIAGNOSIS — E871 Hypo-osmolality and hyponatremia: Secondary | ICD-10-CM | POA: Diagnosis not present

## 2023-10-09 DIAGNOSIS — L0390 Cellulitis, unspecified: Secondary | ICD-10-CM | POA: Diagnosis not present

## 2023-10-09 DIAGNOSIS — R051 Acute cough: Secondary | ICD-10-CM | POA: Diagnosis not present

## 2023-10-09 DIAGNOSIS — R531 Weakness: Secondary | ICD-10-CM | POA: Diagnosis not present

## 2023-10-09 DIAGNOSIS — M158 Other polyosteoarthritis: Secondary | ICD-10-CM | POA: Diagnosis not present

## 2023-10-09 DIAGNOSIS — R262 Difficulty in walking, not elsewhere classified: Secondary | ICD-10-CM | POA: Diagnosis not present

## 2023-10-09 DIAGNOSIS — G2581 Restless legs syndrome: Secondary | ICD-10-CM | POA: Diagnosis not present

## 2023-10-09 DIAGNOSIS — M0579 Rheumatoid arthritis with rheumatoid factor of multiple sites without organ or systems involvement: Secondary | ICD-10-CM | POA: Diagnosis not present

## 2023-10-09 DIAGNOSIS — K219 Gastro-esophageal reflux disease without esophagitis: Secondary | ICD-10-CM | POA: Diagnosis not present

## 2023-10-09 DIAGNOSIS — L03115 Cellulitis of right lower limb: Secondary | ICD-10-CM | POA: Diagnosis not present

## 2023-10-09 DIAGNOSIS — I10 Essential (primary) hypertension: Secondary | ICD-10-CM | POA: Diagnosis not present

## 2023-10-09 DIAGNOSIS — G8911 Acute pain due to trauma: Secondary | ICD-10-CM | POA: Diagnosis not present

## 2023-10-09 DIAGNOSIS — E785 Hyperlipidemia, unspecified: Secondary | ICD-10-CM | POA: Diagnosis not present

## 2023-10-09 DIAGNOSIS — M79604 Pain in right leg: Secondary | ICD-10-CM | POA: Diagnosis not present

## 2023-10-09 DIAGNOSIS — M7989 Other specified soft tissue disorders: Secondary | ICD-10-CM | POA: Diagnosis not present

## 2023-10-09 DIAGNOSIS — M1990 Unspecified osteoarthritis, unspecified site: Secondary | ICD-10-CM | POA: Diagnosis not present

## 2023-10-09 DIAGNOSIS — A419 Sepsis, unspecified organism: Secondary | ICD-10-CM | POA: Diagnosis not present

## 2023-10-09 DIAGNOSIS — Z9981 Dependence on supplemental oxygen: Secondary | ICD-10-CM | POA: Diagnosis not present

## 2023-10-09 DIAGNOSIS — S52021A Displaced fracture of olecranon process without intraarticular extension of right ulna, initial encounter for closed fracture: Secondary | ICD-10-CM | POA: Diagnosis not present

## 2023-10-09 DIAGNOSIS — G35 Multiple sclerosis: Secondary | ICD-10-CM | POA: Diagnosis not present

## 2023-10-09 DIAGNOSIS — R278 Other lack of coordination: Secondary | ICD-10-CM | POA: Diagnosis not present

## 2023-10-09 DIAGNOSIS — Z7401 Bed confinement status: Secondary | ICD-10-CM | POA: Diagnosis not present

## 2023-10-09 DIAGNOSIS — M6281 Muscle weakness (generalized): Secondary | ICD-10-CM | POA: Diagnosis not present

## 2023-10-09 DIAGNOSIS — M069 Rheumatoid arthritis, unspecified: Secondary | ICD-10-CM | POA: Diagnosis not present

## 2023-10-09 DIAGNOSIS — L97811 Non-pressure chronic ulcer of other part of right lower leg limited to breakdown of skin: Secondary | ICD-10-CM | POA: Diagnosis not present

## 2023-10-09 LAB — BASIC METABOLIC PANEL WITH GFR
Anion gap: 12 (ref 5–15)
BUN: 6 mg/dL — ABNORMAL LOW (ref 8–23)
CO2: 22 mmol/L (ref 22–32)
Calcium: 8.2 mg/dL — ABNORMAL LOW (ref 8.9–10.3)
Chloride: 94 mmol/L — ABNORMAL LOW (ref 98–111)
Creatinine, Ser: 0.74 mg/dL (ref 0.44–1.00)
GFR, Estimated: >60 mL/min (ref >60)
Glucose, Bld: 95 mg/dL (ref 70–99)
Potassium: 3.1 mmol/L — ABNORMAL LOW (ref 3.5–5.1)
Sodium: 128 mmol/L — ABNORMAL LOW (ref 135–145)

## 2023-10-09 LAB — HEPATIC FUNCTION PANEL
ALT: 41 U/L (ref 0–44)
AST: 45 U/L — ABNORMAL HIGH (ref 15–41)
Albumin: 2.6 g/dL — ABNORMAL LOW (ref 3.5–5.0)
Alkaline Phosphatase: 37 U/L — ABNORMAL LOW (ref 38–126)
Bilirubin, Direct: 0.2 mg/dL (ref 0.0–0.2)
Indirect Bilirubin: 0.8 mg/dL (ref 0.3–0.9)
Total Bilirubin: 1 mg/dL (ref 0.0–1.2)
Total Protein: 6.3 g/dL — ABNORMAL LOW (ref 6.5–8.1)

## 2023-10-09 LAB — CBC
HCT: 31.4 % — ABNORMAL LOW (ref 36.0–46.0)
Hemoglobin: 10.9 g/dL — ABNORMAL LOW (ref 12.0–15.0)
MCH: 35.3 pg — ABNORMAL HIGH (ref 26.0–34.0)
MCHC: 34.7 g/dL (ref 30.0–36.0)
MCV: 101.6 fL — ABNORMAL HIGH (ref 80.0–100.0)
Platelets: 223 10*3/uL (ref 150–400)
RBC: 3.09 MIL/uL — ABNORMAL LOW (ref 3.87–5.11)
RDW: 16.4 % — ABNORMAL HIGH (ref 11.5–15.5)
WBC: 12.2 10*3/uL — ABNORMAL HIGH (ref 4.0–10.5)
nRBC: 0.2 % (ref 0.0–0.2)

## 2023-10-09 NOTE — Discharge Summary (Signed)
 Physician Discharge Summary   Patient: Sherri  BENICIA [name] MRN: [id number] DOB: 10/12/1940  Admit date:     10/02/2023  Discharge date: 10/09/23  Discharge Physician: Jodeane Mulligan   PCP: Colene Dauphin, MD   Recommendations at discharge:    Pt to be discharged to SNF.   If you experience worsening fever, chills, chest pain, shortness of breath, or other concerning symptoms, please call your PCP or go to the emergency department immediately.  Discharge Diagnoses: Principal Problem:   Sepsis due to cellulitis [facility]) Active Problems:   Hyponatremia   Cellulitis of right lower extremity   Multiple sclerosis (HCC)   Essential hypertension   Rheumatoid arthritis (HCC)   Fall at home, initial encounter   Cellulitis   Pressure injury of skin  Resolved Problems:   * No resolved hospital problems. *   Hospital Course:  83 year old with a history of MS in remission, RA on chronic prednisone  MTX and [name] , HTN, HLD, chronic pain, and anxiety who presented to the ER from home with 2 days of right leg swelling and erythema after a mechanical fall at home. She has an unrelated persistent open wound of her right lower extremity following a squamous cell carcinoma excision for which she has been seeing a Dermatologist in the outpatient setting. Notes on 5/2 suggest the wound was improving. In the ER CT head was without acute findings but did note extensive chronic microvascular change. CT right lower extremity showed an indeterminant popliteal vein focal dilation with no clear filling defect with similar findings of the mid femoral vein. There was however no evidence of fracture or subcutaneous emphysema or abscess.    Assessment and Plan:   Cellulitis of right lower extremity venous insufficiency - Slow healing wound related to excision of squamous cell carcinoma.  Doppler ultrasound ruling out DVT.  Responded well to inpatient IV broad-spectrum antibiotic coverage.  Transition to p.o.  doxycycline .  Twice daily wound care.  Wound of boot to provide more consistent compression to help with venous insufficiency.  Continue to elevate foot when able.   Sepsis secondary to above - Tachycardia, tachypnea, cellulitis on presentation given concern for sepsis.  Was given volume resuscitation, blood cultures, empiric antibiotics.  Appears resolved at this time.   Acute metabolic cephalopathy - Appears to have intermittent delirium, possibility of some underlying dementia.  Waxes and wanes.  A&O x 4.  Some memory problems. Appears resolved and at baseline.    Fall with acute right elbow skin tear and acute transverse olecranon fracture - 5/14 had a ground-level fall.  Subsequently had a right elbow skin tear with initial difficulty in achieving homeostasis.  Steri-Strips used to approximate, from the pad with good results.  Imaging ordered last night with results showing acute transverse olecranon fracture.  Orthopedic surgery consulted.  Recommendations to keep elbow in splint, nonweightbearing, follow-up with Dr. Curtiss Dowdy next week.  No acute surgical intervention necessary at this time.  Wound care recommendations:  Dressing procedure/placement/frequency: Topical treatment orders provided for bedside nurses to perform as follows: Leave steristrips in place to right arm wound until they fall off.  Apply Xeroform gauze and ABD pad and kerlex over the site and change Q THURS/SAT/MON.    Mild hyponatremia - Likely from hypovolemia, low sodium intake.  Resolved after fluid resuscitation.   Hypokalemia - Resolved after replenishment.   Rheumatoid arthritis - Followed by rheumatology in outpatient setting.  Biologics/methotrexate  on hold while inpatient.  Okay to resume upon discharge.  Resume  normal medication regimen.   Physical debilitation and muscle weakness with fall at home - Evaluated by PT/OT.  Recommending STR/SNF.    Consultants: orthopedic surgery Procedures performed: none   Disposition: Skilled nursing facility Diet recommendation:  Discharge Diet Orders (From admission, onward)     Start     Ordered   10/07/23 0000  Diet - low sodium heart healthy        10/07/23 1044           Carb modified diet  DISCHARGE MEDICATION: Allergies as of 10/09/2023       Reactions   Darvon [propoxyphene Hcl] Shortness Of Breath   ? Dose Related ? Lowered respirations greatly   Demerol [meperidine] Shortness Of Breath, Other (See Comments)   Respiratory Distress   Penicillins Hives, Other (See Comments)   Has patient had a PCN reaction causing immediate rash, facial/tongue/throat swelling, SOB or lightheadedness with hypotension: No SEVERE RASH INVOLVING MUCUS MEMBRANES or SKIN NECROSIS: #  #  #  YES  #  #  #  Has patient had a PCN reaction that required hospitalization No Has patient had a PCN reaction occurring within the last 10 years: No.   Imuran [azathioprine] Other (See Comments)   Weakness   Sulfa  Antibiotics Nausea Only, Other (See Comments)   States had severe indigestion and heartburn and nausea and had to stop taking   Arava [leflunomide] Other (See Comments)   Excessive weight gain   Lipitor [atorvastatin ] Nausea And Vomiting   Currently taking 2 times weekly - able to tolerate low dose    Vytorin [ezetimibe-simvastatin] Nausea And Vomiting        Medication List     TAKE these medications    atorvastatin  10 MG tablet Commonly known as: LIPITOR TAKE 1 TAB ON MONDAYS AND FRIDAY   Biotin  5000 MCG Tabs Take 10,000 mcg by mouth every evening.   carvedilol  6.25 MG tablet Commonly known as: COREG  Take 1 tablet (6.25 mg total) by mouth 2 (two) times daily with a meal.   cholecalciferol  25 MCG (1000 UNIT) tablet Commonly known as: VITAMIN D3 Take 1,000 Units by mouth in the morning.   cyclobenzaprine  10 MG tablet Commonly known as: FLEXERIL  TAKE 1 TABLET BY MOUTH THREE TIMES A DAY AS NEEDED FOR MUSCLE SPASM   diazepam  5 MG  tablet Commonly known as: VALIUM  TAKE 1 TABLET BY MOUTH AT BEDTIME AS NEEDED FOR MUSCLE SPASMS.   diclofenac sodium 1 % Gel Commonly known as: VOLTAREN APPLY 3 GRAMS TO 3 LARGE JOINTS 3 TIMES A DAY AS NEEDED   doxycycline  100 MG tablet Commonly known as: VIBRA -TABS Take 1 tablet (100 mg total) by mouth every 12 (twelve) hours.   DULoxetine  30 MG capsule Commonly known as: CYMBALTA  TAKE 1 CAPSULE BY MOUTH EVERY DAY   folic acid  1 MG tablet Commonly known as: FOLVITE  Take 1 tablet (1 mg total) by mouth daily.   furosemide  20 MG tablet Commonly known as: LASIX  TAKE 1 TABLET BY MOUTH EVERY DAY   LUBRICATING EYE DROPS OP Place 1 drop into both eyes daily as needed (dry eyes).   methotrexate  2.5 MG tablet Commonly known as: RHEUMATREX Take 5 tablets (12.5 mg total) by mouth once a week. Caution:Chemotherapy. Protect from light.   pantoprazole  40 MG tablet Commonly known as: PROTONIX  Take 1 tablet (40 mg total) by mouth 2 (two) times daily before a meal.   predniSONE  5 MG tablet Commonly known as: DELTASONE  TAKE 1 TABLET BY MOUTH  EVERY DAY IN THE MORNING   Prolia  60 MG/ML Sosy injection Generic drug: denosumab  Inject 60 mg into the skin every 6 (six) months. Deliver to rheum: [address], [address], [place], [place] [address]. Appt on 07/23/2022   [name]  15 MG Tb24 Generic drug: Upadacitinib  ER Take 15 mg by mouth daily.               Discharge Care Instructions  (From admission, onward)           Start     Ordered   10/09/23 0000  Discharge wound care:        10/09/23 0926   10/07/23 0000  Discharge wound care:       Comments: Cleanse R lower leg wound with Vashe wound cleanser Timm Foot [phone]) do not rinse and allow to air dry.  Apply vaseline gauze ([place]son [phone]) to wound bed daily, cover with unna boot - change 2x a week when unna boot changed   10/07/23 1044            Contact information for after-discharge care     Destination      [facility] .   Service: Skilled Nursing Contact information: [address] [place]  [address] [phone]                     Discharge Exam: Sherri [name] Weights   10/02/23 1822  Weight: 54.4 kg    GENERAL:  Alert, pleasant, no acute distress  HEENT:  EOMI CARDIOVASCULAR:  RRR, no murmurs appreciated RESPIRATORY:  Clear to auscultation, no wheezing, rales, or rhonchi GASTROINTESTINAL:  Soft, nontender, nondistended EXTREMITIES: RLE with mild edema, erythema, tenderness.  Fresh bandage over anterior RLE.  Right elbow in splint with fresh bandage NEURO:  No new focal deficits appreciated SKIN:  No rashes noted PSYCH:  Appropriate mood and affect    Condition at discharge: improving  The results of significant diagnostics from this hospitalization (including imaging, microbiology, ancillary and laboratory) are listed below for reference.   Imaging Studies: DG Elbow 2 Views Right Result Date: 10/08/2023 CLINICAL DATA:  Fall.  Elbow pain. EXAM: RIGHT ELBOW - 2 VIEW COMPARISON:  None Available. FINDINGS: Limited two view study shows a transverse fracture of the olecranon. No other acute fracture evident. No subluxation or dislocation. Lateral view is oblique, limiting assessment but anterior fat pad appears elevated suggesting joint effusion. IMPRESSION: Limited two view study demonstrates transverse fracture of the olecranon . Electronically Signed   By: Donnal Fusi M.D.   On: 10/08/2023 07:28   VAS US  LOWER EXTREMITY VENOUS (DVT) Result Date: 10/05/2023  Lower Venous DVT Study Patient Name:  Sherri  Trudi [name]  Date of Exam:   10/05/2023 Medical Rec #: [id number]          Accession #:    [id number] Date of Birth: 1941-01-26          Patient Gender: F Patient Age:   34 years Exam Location:  [facility] Procedure:      VAS US  LOWER EXTREMITY VENOUS (DVT) Referring Phys: JEFFREY MCCLUNG  --------------------------------------------------------------------------------  Indications: Pain, Swelling, and Erythema. Other Indications: Cellulitis. Risk Factors: Mechanical fall 3 days ago, chronic open wound to RLE post squamous cell carcinoma excision. Limitations: Poor ultrasound/tissue interface, open wound and bandages. Comparison Study: No previous RLEV exams Performing Technologist: Jody Hill RVT, RDMS  Examination Guidelines: A complete evaluation includes B-mode imaging, spectral Doppler, color Doppler, and power Doppler as needed of all  accessible portions of each vessel. Bilateral testing is considered an integral part of a complete examination. Limited examinations for reoccurring indications may be performed as noted. The reflux portion of the exam is performed with the patient in reverse Trendelenburg.  +---------+---------------+---------+-----------+----------+-------------------+ RIGHT    CompressibilityPhasicitySpontaneityPropertiesThrombus Aging      +---------+---------------+---------+-----------+----------+-------------------+ CFV      Full           Yes      Yes                                      +---------+---------------+---------+-----------+----------+-------------------+ SFJ      Full                                                             +---------+---------------+---------+-----------+----------+-------------------+ FV Prox  Full           Yes      Yes                                      +---------+---------------+---------+-----------+----------+-------------------+ FV Mid   Full           Yes      Yes                                      +---------+---------------+---------+-----------+----------+-------------------+ FV DistalFull           Yes      Yes                                      +---------+---------------+---------+-----------+----------+-------------------+ PFV      Full                                                              +---------+---------------+---------+-----------+----------+-------------------+ POP      Full           Yes      Yes                                      +---------+---------------+---------+-----------+----------+-------------------+ PTV                                                   unable to visualize +---------+---------------+---------+-----------+----------+-------------------+ PERO                                                  unable to visualize +---------+---------------+---------+-----------+----------+-------------------+  Right Technical Findings: Not visualized segments include peroneal and posterior tibial veins due to open wound/ bandage placement.  +----+---------------+---------+-----------+----------+--------------+ LEFTCompressibilityPhasicitySpontaneityPropertiesThrombus Aging +----+---------------+---------+-----------+----------+--------------+ CFV Full           Yes      Yes                                 +----+---------------+---------+-----------+----------+--------------+     Summary: RIGHT: - There is no evidence of deep vein thrombosis in the lower extremity. However, portions of this examination were limited- see technologist comments above.  - No cystic structure found in the popliteal fossa.  LEFT: - No evidence of common femoral vein obstruction.   *See table(s) above for measurements and observations. Electronically signed by Irvin Mantel on 10/05/2023 at 5:20:08 PM.    Final    CT EXTREMITY LOWER RIGHT W CONTRAST Result Date: 10/02/2023 CLINICAL DATA:  swelling, erythematous EXAM: CT OF THE LOWER RIGHT EXTREMITY WITH CONTRAST TECHNIQUE: Multidetector CT imaging of the lower right extremity was performed according to the standard protocol following intravenous contrast administration. RADIATION DOSE REDUCTION: This exam was performed according to the departmental dose-optimization program which includes automated  exposure control, adjustment of the mA and/or kV according to patient size and/or use of iterative reconstruction technique. CONTRAST:  75mL OMNIPAQUE  IOHEXOL  350 MG/ML SOLN COMPARISON:  CT abdomen pelvis 09/16/2022 FINDINGS: Bones/Joint/Cartilage No evidence of fracture, dislocation, or joint effusion. Mild lateral tibiofemoral compartment joint space narrowing. Mild to moderate medial tibiofemoral compartment joint space narrowing. Mild degenerative changes of the foot. No evidence of severe arthropathy. No aggressive appearing focal bone abnormality. Partially visualized healed right sacral insufficiency fracture. Ligaments Suboptimally assessed by CT. Muscles and Tendons Grossly unremarkable. Soft tissues Diffuse leg, ankle, foot subcutaneus soft tissue edema. No subcutaneus soft tissue emphysema. No organized fluid collection. Vascular: Severe atherosclerotic plaque with limited evaluation due to non angiography phase. Indeterminate popliteal vein focal dilatation (4:257) with no definite filling defect. Similar finding to lesser extent of the mid femoral vein (4:204). Visualized pelvis: Urinary bladder filled with urine. IMPRESSION: 1. Indeterminate popliteal vein focal dilatation with no definite filling defect. Similar finding to lesser extent of the mid femoral vein. Recommend right lower extremity venous ultrasound for exclusion of underlying thrombus. 2. Severe atherosclerotic plaque with limited evaluation due to non angiography phase. 3. Diffuse leg, ankle, foot subcutaneus soft tissue edema. No subcutaneus soft tissue emphysema. 4.  No acute displaced fracture or dislocation. Electronically Signed   By: Morgane  Naveau M.D.   On: 10/02/2023 22:34   CT Head Wo Contrast Result Date: 10/02/2023 CLINICAL DATA:  Mental status change EXAM: CT HEAD WITHOUT CONTRAST TECHNIQUE: Contiguous axial images were obtained from the base of the skull through the vertex without intravenous contrast. RADIATION DOSE  REDUCTION: This exam was performed according to the departmental dose-optimization program which includes automated exposure control, adjustment of the mA and/or kV according to patient size and/or use of iterative reconstruction technique. COMPARISON:  CT head 09/16/2022. FINDINGS: Brain: No acute intracranial hemorrhage. No CT evidence of acute infarct. Nonspecific hypoattenuation in the periventricular and subcortical white matter favored to reflect chronic microvascular ischemic changes. No edema, mass effect, or midline shift. The basilar cisterns are patent. Ventricles: Prominence of the ventricles suggesting underlying parenchymal volume loss. Vascular: Atherosclerotic calcifications of the carotid siphons and intracranial vertebral arteries. No hyperdense vessel. Skull: No acute or aggressive finding. Orbits: Orbits are symmetric. Sinuses: Similar postsurgical changes  of the paranasal sinuses. Other: Trace fluid in the mastoid air cells. IMPRESSION: No CT evidence of acute intracranial abnormality. Extensive chronic microvascular ischemic changes. Mild parenchymal volume loss. Electronically Signed   By: Denny Flack M.D.   On: 10/02/2023 21:18   DG Chest Port 1 View Result Date: 10/02/2023 CLINICAL DATA:  Questionable sepsis - evaluate for abnormality, fever EXAM: PORTABLE CHEST - 1 VIEW COMPARISON:  June 20, 2021 FINDINGS: Elevation of the right hemidiaphragm. Biapical pleural thickening. No focal airspace consolidation, pleural effusion, or pneumothorax. No cardiomegaly. Tortuous aorta with aortic atherosclerosis. No acute fracture or destructive lesions. Multilevel thoracic osteophytosis. Cervical fusion hardware. Osteopenia. IMPRESSION: No acute cardiopulmonary abnormality. Electronically Signed   By: Rance Burrows M.D.   On: 10/02/2023 19:06    Microbiology: Results for orders placed or performed during the hospital encounter of 10/02/23  Blood Culture (routine x 2)     Status: None    Collection Time: 10/02/23  5:48 PM   Specimen: BLOOD  Result Value Ref Range Status   Specimen Description BLOOD LEFT ANTECUBITAL  Final   Special Requests   Final    BOTTLES DRAWN AEROBIC AND ANAEROBIC Blood Culture results may not be optimal due to an inadequate volume of blood received in culture bottles   Culture   Final    NO GROWTH 5 DAYS Performed at [facility], [address] [place], [place] [address]    Report Status 10/07/2023 FINAL  Final  Blood Culture (routine x 2)     Status: None   Collection Time: 10/02/23  6:01 PM   Specimen: BLOOD  Result Value Ref Range Status   Specimen Description BLOOD RIGHT ANTECUBITAL  Final   Special Requests   Final    BOTTLES DRAWN AEROBIC AND ANAEROBIC Blood Culture results may not be optimal due to an inadequate volume of blood received in culture bottles   Culture   Final    NO GROWTH 5 DAYS Performed at [facility], [address], [place], [place] [address]    Report Status 10/07/2023 FINAL  Final  MRSA Next Gen by PCR, Nasal     Status: None   Collection Time: 10/04/23  8:29 AM   Specimen: Nasal Mucosa; Nasal Swab  Result Value Ref Range Status   MRSA by PCR Next Gen NOT DETECTED NOT DETECTED Final    Comment: (NOTE) The GeneXpert MRSA Assay (FDA approved for NASAL specimens only), is one component of a comprehensive MRSA colonization surveillance program. It is not intended to diagnose MRSA infection nor to guide or monitor treatment for MRSA infections. Test performance is not FDA approved in patients less than 20 years old. Performed at [facility], [address] [place], [place] [address]    *Note: Due to a large number of results and/or encounters for the requested time period, some results have not been displayed. A complete set of results can be found in Results Review.    Labs: CBC: Recent Labs  Lab 10/02/23 1748 10/02/23 1801 10/04/23 0759 10/06/23 0644 10/07/23 0624 10/07/23 2221  10/08/23 1105  WBC 5.1  --  7.1 4.6 6.2  --  10.2  NEUTROABS 4.4  --   --   --   --   --   --   HGB 12.4   < > 10.5* 10.9* 10.8* 10.3* 10.5*  HCT 35.3*   < > 30.0* 31.2* 31.6* 30.1* 29.9*  MCV 102.3*  --  103.1* 100.0  101.9*  --  101.7*  PLT 215  --  190 200 210  --  218   < > = values in this interval not displayed.   Basic Metabolic Panel: Recent Labs  Lab 10/02/23 1748 10/02/23 1801 10/04/23 0759 10/05/23 4010 10/06/23 0644 10/06/23 0939 10/07/23 0624  NA 125* 123* 129* 130* 130*  --  132*  K 3.6 3.6 3.6 3.2* 3.0*  --  3.8  CL 90*  --  97* 97* 93*  --  100  CO2 26  --  19* 24 27  --  21*  GLUCOSE 84  --  77 92 91  --  101*  BUN 11  --  9 7* <5*  --  <5*  CREATININE 1.05*  --  0.75 0.61 0.61  --  0.63  CALCIUM  9.4  --  8.5* 8.1* 8.2*  --  8.4*  MG  --   --   --   --   --  1.8  --    Liver Function Tests: Recent Labs  Lab 10/02/23 1748 10/04/23 0759 10/07/23 0624  AST 77* 110* 34  ALT 25 40 30  ALKPHOS 25* 31* 29*  BILITOT 1.3* 0.8 0.7  PROT 6.6 5.7* 5.6*  ALBUMIN 3.6 2.8* 2.5*   CBG: No results for input(s): "GLUCAP" in the last 168 hours.  Discharge time spent: 28 minutes.  Signed: Jodeane Mulligan, DO [facility] 10/09/2023

## 2023-10-09 NOTE — Plan of Care (Signed)
  Problem: Education: Goal: Knowledge of General Education information will improve Description: Including pain rating scale, medication(s)/side effects and non-pharmacologic comfort measures Outcome: Progressing   Problem: Clinical Measurements: Goal: Ability to maintain clinical measurements within normal limits will improve Outcome: Progressing   Problem: Pain Managment: Goal: General experience of comfort will improve and/or be controlled Outcome: Progressing   Problem: Safety: Goal: Ability to remain free from injury will improve Outcome: Progressing   Problem: Skin Integrity: Goal: Risk for impaired skin integrity will decrease Outcome: Progressing

## 2023-10-12 ENCOUNTER — Encounter: Payer: Self-pay | Admitting: Rheumatology

## 2023-10-12 ENCOUNTER — Telehealth: Payer: Self-pay

## 2023-10-12 DIAGNOSIS — Z9981 Dependence on supplemental oxygen: Secondary | ICD-10-CM | POA: Diagnosis not present

## 2023-10-12 NOTE — Transitions of Care (Post Inpatient/ED Visit) (Signed)
   10/12/2023  Name: Sherri [name] MRN: [id number] DOB: [birth year]  Today's TOC FU Call Status: Today's TOC FU Call Status:: Unsuccessful Call (1st Attempt) Unsuccessful Call (1st Attempt) Date: 10/12/23  Attempted to reach the patient regarding the most recent Inpatient/ED visit.  Follow Up Plan: Additional outreach attempts will be made to reach the patient to complete the Transitions of Care (Post Inpatient/ED visit) call.   Signature  [place], LPN [facility] Health Advisor [place] [facility] You Are. We Are. One [facility] Direct Dial [phone]

## 2023-10-15 DIAGNOSIS — L97811 Non-pressure chronic ulcer of other part of right lower leg limited to breakdown of skin: Secondary | ICD-10-CM | POA: Diagnosis not present

## 2023-10-15 DIAGNOSIS — L03115 Cellulitis of right lower limb: Secondary | ICD-10-CM | POA: Diagnosis not present

## 2023-10-15 DIAGNOSIS — G35 Multiple sclerosis: Secondary | ICD-10-CM | POA: Diagnosis not present

## 2023-10-15 DIAGNOSIS — A419 Sepsis, unspecified organism: Secondary | ICD-10-CM | POA: Diagnosis not present

## 2023-10-23 DIAGNOSIS — A419 Sepsis, unspecified organism: Secondary | ICD-10-CM | POA: Diagnosis not present

## 2023-10-23 DIAGNOSIS — L03115 Cellulitis of right lower limb: Secondary | ICD-10-CM | POA: Diagnosis not present

## 2023-10-23 DIAGNOSIS — L97811 Non-pressure chronic ulcer of other part of right lower leg limited to breakdown of skin: Secondary | ICD-10-CM | POA: Diagnosis not present

## 2023-10-23 DIAGNOSIS — G35 Multiple sclerosis: Secondary | ICD-10-CM | POA: Diagnosis not present

## 2023-10-27 DIAGNOSIS — L03115 Cellulitis of right lower limb: Secondary | ICD-10-CM | POA: Diagnosis not present

## 2023-10-27 DIAGNOSIS — L97811 Non-pressure chronic ulcer of other part of right lower leg limited to breakdown of skin: Secondary | ICD-10-CM | POA: Diagnosis not present

## 2023-10-29 DIAGNOSIS — L97811 Non-pressure chronic ulcer of other part of right lower leg limited to breakdown of skin: Secondary | ICD-10-CM | POA: Diagnosis not present

## 2023-10-29 DIAGNOSIS — A419 Sepsis, unspecified organism: Secondary | ICD-10-CM | POA: Diagnosis not present

## 2023-10-29 DIAGNOSIS — I87311 Chronic venous hypertension (idiopathic) with ulcer of right lower extremity: Secondary | ICD-10-CM | POA: Diagnosis not present

## 2023-10-29 DIAGNOSIS — L03115 Cellulitis of right lower limb: Secondary | ICD-10-CM | POA: Diagnosis not present

## 2023-11-05 DIAGNOSIS — I87311 Chronic venous hypertension (idiopathic) with ulcer of right lower extremity: Secondary | ICD-10-CM | POA: Diagnosis not present

## 2023-11-05 DIAGNOSIS — L03115 Cellulitis of right lower limb: Secondary | ICD-10-CM | POA: Diagnosis not present

## 2023-11-05 DIAGNOSIS — L97811 Non-pressure chronic ulcer of other part of right lower leg limited to breakdown of skin: Secondary | ICD-10-CM | POA: Diagnosis not present

## 2023-11-05 DIAGNOSIS — A419 Sepsis, unspecified organism: Secondary | ICD-10-CM | POA: Diagnosis not present

## 2023-11-06 ENCOUNTER — Ambulatory Visit: Payer: Self-pay

## 2023-11-06 NOTE — Telephone Encounter (Signed)
 FYI Only or Action Required?: FYI only for provider  Patient was last seen in primary care on 07/27/2023 by Colene Dauphin, MD. Called Nurse Triage reporting Hip Pain. Symptoms began several months ago. Interventions attempted: OTC medications: tylenol , Prescription medications: Lidoderm , Voltaren gel,  flexeril , Cymbalta , and Rest, hydration, or home remedies. Symptoms are: unchanged.  Triage Disposition: See PCP When Office is Open (Within 3 Days)  Patient/caregiver understands and will follow disposition?: Yes     Copied from CRM [id number]. Topic: Clinical - Red Word Triage >> Nov 06, 2023 12:10 PM Alyse July wrote: Red Word that prompted transfer to Nurse Triage: multiple falls and possible broken bone   ----------------------------------------------------------------------- From previous Reason for Contact - Scheduling: Patient/patient representative is calling to schedule an appointment. Refer to attachments for appointment information. Reason for Disposition  [1] MODERATE pain (e.g., interferes with normal activities, limping) AND [2] present > 3 days  Answer Assessment - Initial Assessment Questions 1. LOCATION and RADIATION: Where is the pain located?      Right hip 3. SEVERITY: How bad is the pain? What does it keep you from doing?   (Scale 1-10; or mild, moderate, severe)   -  MILD (1-3): doesn't interfere with normal activities    -  MODERATE (4-7): interferes with normal activities (e.g., work or school) or awakens from sleep, limping    -  SEVERE (8-10): excruciating pain, unable to do any normal activities, unable to walk     severe 4. ONSET: When did the pain start? Does it come and go, or is it there all the time?     Almost 2 months ago 5. WORK OR EXERCISE: Has there been any recent work or exercise that involved this part of the body?      Fall over a month ago 6. CAUSE: What do you think is causing the hip pain?      Possible break after falling in  may 7. AGGRAVATING FACTORS: What makes the hip pain worse? (e.g., walking, climbing stairs, running)     walking 8. OTHER SYMPTOMS: Do you have any other symptoms? (e.g., back pain, pain shooting down leg,  fever, rash)         Decrease appetite   Daughter states that they brought the pt home from rehab today due to pt not doing well in there. Instructed if pain or symptoms worsens to take pt to ED.  Protocols used: Hip Pain-A-AH

## 2023-11-09 ENCOUNTER — Telehealth: Payer: Self-pay

## 2023-11-09 ENCOUNTER — Other Ambulatory Visit: Payer: Self-pay | Admitting: Internal Medicine

## 2023-11-09 DIAGNOSIS — L03115 Cellulitis of right lower limb: Secondary | ICD-10-CM | POA: Diagnosis not present

## 2023-11-09 DIAGNOSIS — Z931 Gastrostomy status: Secondary | ICD-10-CM | POA: Diagnosis not present

## 2023-11-09 DIAGNOSIS — Z7952 Long term (current) use of systemic steroids: Secondary | ICD-10-CM | POA: Diagnosis not present

## 2023-11-09 DIAGNOSIS — H9190 Unspecified hearing loss, unspecified ear: Secondary | ICD-10-CM | POA: Diagnosis not present

## 2023-11-09 DIAGNOSIS — J9601 Acute respiratory failure with hypoxia: Secondary | ICD-10-CM | POA: Diagnosis not present

## 2023-11-09 DIAGNOSIS — G894 Chronic pain syndrome: Secondary | ICD-10-CM | POA: Diagnosis not present

## 2023-11-09 DIAGNOSIS — M069 Rheumatoid arthritis, unspecified: Secondary | ICD-10-CM | POA: Diagnosis not present

## 2023-11-09 DIAGNOSIS — I10 Essential (primary) hypertension: Secondary | ICD-10-CM | POA: Diagnosis not present

## 2023-11-09 DIAGNOSIS — M4312 Spondylolisthesis, cervical region: Secondary | ICD-10-CM | POA: Diagnosis not present

## 2023-11-09 DIAGNOSIS — J9602 Acute respiratory failure with hypercapnia: Secondary | ICD-10-CM | POA: Diagnosis not present

## 2023-11-09 DIAGNOSIS — G2581 Restless legs syndrome: Secondary | ICD-10-CM | POA: Diagnosis not present

## 2023-11-09 DIAGNOSIS — M159 Polyosteoarthritis, unspecified: Secondary | ICD-10-CM | POA: Diagnosis not present

## 2023-11-09 DIAGNOSIS — A419 Sepsis, unspecified organism: Secondary | ICD-10-CM | POA: Diagnosis not present

## 2023-11-09 DIAGNOSIS — E785 Hyperlipidemia, unspecified: Secondary | ICD-10-CM | POA: Diagnosis not present

## 2023-11-09 DIAGNOSIS — G9341 Metabolic encephalopathy: Secondary | ICD-10-CM | POA: Diagnosis not present

## 2023-11-09 DIAGNOSIS — M5416 Radiculopathy, lumbar region: Secondary | ICD-10-CM | POA: Diagnosis not present

## 2023-11-09 DIAGNOSIS — E871 Hypo-osmolality and hyponatremia: Secondary | ICD-10-CM | POA: Diagnosis not present

## 2023-11-09 DIAGNOSIS — K219 Gastro-esophageal reflux disease without esophagitis: Secondary | ICD-10-CM | POA: Diagnosis not present

## 2023-11-09 DIAGNOSIS — M810 Age-related osteoporosis without current pathological fracture: Secondary | ICD-10-CM | POA: Diagnosis not present

## 2023-11-09 DIAGNOSIS — S51001D Unspecified open wound of right elbow, subsequent encounter: Secondary | ICD-10-CM | POA: Diagnosis not present

## 2023-11-09 DIAGNOSIS — S81801D Unspecified open wound, right lower leg, subsequent encounter: Secondary | ICD-10-CM | POA: Diagnosis not present

## 2023-11-09 DIAGNOSIS — G35 Multiple sclerosis: Secondary | ICD-10-CM | POA: Diagnosis not present

## 2023-11-09 DIAGNOSIS — M5412 Radiculopathy, cervical region: Secondary | ICD-10-CM | POA: Diagnosis not present

## 2023-11-09 NOTE — Transitions of Care (Post Inpatient/ED Visit) (Signed)
 11/09/2023  Name: Sherri [name]  Sherri [name] MRN: [id number] DOB: 1941/02/08  Today's TOC FU Call Status: Today's TOC FU Call Status:: Successful TOC FU Call Completed TOC FU Call Complete Date: 11/09/23 Patient's Name and Date of Birth confirmed.  Transition Care Management Follow-up Telephone Call Date of Discharge: 11/06/23 Name of Other (Non[facility]) Discharge Facility: [facility] Type of Discharge: Inpatient Admission Primary Inpatient Discharge Diagnosis:: Sepsis due to cellulitis How have you been since you were released from the hospital?: Better Any questions or concerns?: Yes Patient Questions/Concerns:: patient is having hip pain, wants to discuss at appointment scheduled for 11/10/23 Patient Questions/Concerns Addressed: Notified Provider of Patient Questions/Concerns  Items Reviewed: Did you receive and understand the discharge instructions provided?: Yes Medications obtained,verified, and reconciled?: Yes (Medications Reviewed) Any new allergies since your discharge?: No Dietary orders reviewed?: NA Do you have support at home?: Yes  Medications Reviewed Today: Medications Reviewed Today     Reviewed by Chauncy Coral, CMA (Certified Medical Assistant) on 11/09/23 at 1134  Med List Status: <None>   Medication Order Taking? Sig Documenting Provider Last Dose Status Informant  atorvastatin  (LIPITOR) 10 MG tablet [id number] Yes TAKE 1 TAB ON MONDAYS AND FRIDAY Colene Dauphin, MD  Active Self, Pharmacy Records  Biotin  5000 MCG TABS [id number] Yes Take 10,000 mcg by mouth every evening. [provider]  Active Self, Pharmacy Records           Med Note Sharion Davidson, [facility]   Fri Apr 10, 2017  2:56 PM)    Carboxymethylcellul-Glycerin  (LUBRICATING EYE DROPS OP) [id number] Yes Place 1 drop into both eyes daily as needed (dry eyes). [provider]  Active Self, Pharmacy Records  carvedilol  (COREG ) 6.25 MG tablet [id number] Yes Take 1 tablet (6.25 mg total) by mouth 2  (two) times daily with a meal. Burns, Beckey Bourgeois, MD  Active Self, Pharmacy Records  cholecalciferol  (VITAMIN D ) 25 MCG (1000 UNIT) tablet [id number] Yes Take 1,000 Units by mouth in the morning. [provider]  Active Self, Pharmacy Records  cyclobenzaprine  (FLEXERIL ) 10 MG tablet [id number] Yes TAKE 1 TABLET BY MOUTH THREE TIMES A DAY AS NEEDED FOR MUSCLE SPASM Burns, Beckey Bourgeois, MD  Active Self, Pharmacy Records  denosumab  (PROLIA ) 60 MG/ML SOSY injection [id number] Yes Inject 60 mg into the skin every 6 (six) months. Deliver to rheum: [address], [place], [place] [address]. Appt on 07/23/2022 Dale, Taylor M, PA-C  Active Self, Pharmacy Records           Med Note Arlyss Berkeley Oct 05, 2023  8:33 AM) Last dose 09/2023.  diazepam  (VALIUM ) 5 MG tablet [id number] Yes TAKE 1 TABLET BY MOUTH AT BEDTIME AS NEEDED FOR MUSCLE SPASMS. Colene Dauphin, MD  Active Self, Pharmacy Records  diclofenac sodium (VOLTAREN) 1 % GEL [id number] Yes APPLY 3 GRAMS TO 3 LARGE JOINTS 3 TIMES A DAY AS NEEDED Deveshwar, Clydie Darter, MD  Active Self, Pharmacy Records  doxycycline  (VIBRA -TABS) 100 MG tablet [id number] Yes Take 1 tablet (100 mg total) by mouth every 12 (twelve) hours. Jodeane Mulligan, DO  Active   DULoxetine  (CYMBALTA ) 30 MG capsule [id number] Yes TAKE 1 CAPSULE BY MOUTH EVERY DAY Isidro Margo, DO  Active Self, Pharmacy Records  folic acid  (FOLVITE ) 1 MG tablet [id number] Yes Take 1 tablet (1 mg total) by mouth daily. Romayne Clubs, PA-C  Active Self, Pharmacy Records  furosemide  (LASIX ) 20 MG tablet [id number] Yes TAKE 1 TABLET BY MOUTH EVERY  Dodson Freestone, MD  Active Self, Pharmacy Records  methotrexate  [facility]) 2.5 MG tablet [id number] Yes Take 5 tablets (12.5 mg total) by mouth once a week. Caution:Chemotherapy. Protect from light. Romayne Clubs, PA-C  Active Self, Pharmacy Records           Med Note Arlyss Berkeley Oct 05, 2023  8:35 AM) Wednesday's  pantoprazole   (PROTONIX ) 40 MG tablet [id number] Yes Take 1 tablet (40 mg total) by mouth 2 (two) times daily before a meal. Donnette Gal, Beckey Bourgeois, MD  Active Self, Pharmacy Records  predniSONE  (DELTASONE ) 5 MG tablet [id number] Yes TAKE 1 TABLET BY MOUTH EVERY DAY IN THE MORNING Romayne Clubs, PA-C  Active Self, Pharmacy Records  Upadacitinib  ER ([name] ) 15 MG TB24 [id number] Yes Take 15 mg by mouth daily. [provider]  Active Self, Pharmacy Records           Med Note (Murrell Arrant, MARISSA C   Wed May 14, 2022 12:48 PM)              Home Care and Equipment/Supplies: Were Home Health Services Ordered?: No Any new equipment or medical supplies ordered?: No  Functional Questionnaire: Do you need assistance with bathing/showering or dressing?: No Do you need assistance with meal preparation?: No Do you need assistance with eating?: No Do you have difficulty maintaining continence: No Do you need assistance with getting out of bed/getting out of a chair/moving?: No Do you have difficulty managing or taking your medications?: No  Follow up appointments reviewed: PCP Follow-up appointment confirmed?: Yes Date of PCP follow-up appointment?: 11/10/23 Follow-up Provider: Dr. Donnette Gal Specialist [facility] Follow-up appointment confirmed?: Yes Date of Specialist follow-up appointment?: 11/25/23 Follow-Up Specialty Provider:: Nicholas Bari, MD Do you need transportation to your follow-up appointment?: No Do you understand care options if your condition(s) worsen?: Yes-patient verbalized understanding    SIGNATURE Sherri [name], CMA (AAMA)  [facility] [phone]

## 2023-11-09 NOTE — Progress Notes (Unsigned)
 Subjective:    Patient ID: Sherri [name]  A Vath, female    DOB: 11-07-1940, 83 y.o.   MRN: [id number]      HPI Sherri [name]  is here for No chief complaint on file.  Admitted the beginning of May for sepsis secondary to cellulitis.  She had had SCC removed and had a persistent right lower extremity wound.  2 days prior to presenting to the emergency room after a fall at home she started having leg swelling and erythema.  She was treated with IV broad-spectrum antibiotics.  She was discharged on p.o. doxycycline .  She was discharged to SNF.   Hip pain     Medications and allergies reviewed with patient and updated if appropriate.  Current Outpatient Medications on File Prior to Visit  Medication Sig Dispense Refill   atorvastatin  (LIPITOR) 10 MG tablet TAKE 1 TAB ON MONDAYS AND FRIDAY 26 tablet 4   Biotin  5000 MCG TABS Take 10,000 mcg by mouth every evening.     Carboxymethylcellul-Glycerin  (LUBRICATING EYE DROPS OP) Place 1 drop into both eyes daily as needed (dry eyes).     carvedilol  (COREG ) 6.25 MG tablet Take 1 tablet (6.25 mg total) by mouth 2 (two) times daily with a meal. 180 tablet 1   cholecalciferol  (VITAMIN D ) 25 MCG (1000 UNIT) tablet Take 1,000 Units by mouth in the morning.     cyclobenzaprine  (FLEXERIL ) 10 MG tablet TAKE 1 TABLET BY MOUTH THREE TIMES A DAY AS NEEDED FOR MUSCLE SPASM 90 tablet 2   denosumab  (PROLIA ) 60 MG/ML SOSY injection Inject 60 mg into the skin every 6 (six) months. Deliver to rheum: [address], [address], [place], [place] [address]. Appt on 07/23/2022 1 mL 0   diazepam  (VALIUM ) 5 MG tablet TAKE 1 TABLET BY MOUTH AT BEDTIME AS NEEDED FOR MUSCLE SPASMS. 30 tablet 5   diclofenac sodium (VOLTAREN) 1 % GEL APPLY 3 GRAMS TO 3 LARGE JOINTS 3 TIMES A DAY AS NEEDED 100 g 0   doxycycline  (VIBRA -TABS) 100 MG tablet Take 1 tablet (100 mg total) by mouth every 12 (twelve) hours. 14 tablet 0   DULoxetine  (CYMBALTA ) 30 MG capsule TAKE 1 CAPSULE BY MOUTH EVERY DAY 90  capsule 1   folic acid  (FOLVITE ) 1 MG tablet Take 1 tablet (1 mg total) by mouth daily. 90 tablet 3   furosemide  (LASIX ) 20 MG tablet TAKE 1 TABLET BY MOUTH EVERY DAY 90 tablet 1   methotrexate  (RHEUMATREX) 2.5 MG tablet Take 5 tablets (12.5 mg total) by mouth once a week. Caution:Chemotherapy. Protect from light. 60 tablet 0   pantoprazole  (PROTONIX ) 40 MG tablet Take 1 tablet (40 mg total) by mouth 2 (two) times daily before a meal. 60 tablet 5   predniSONE  (DELTASONE ) 5 MG tablet TAKE 1 TABLET BY MOUTH EVERY DAY IN THE MORNING 90 tablet 0   traMADol  (ULTRAM ) 50 MG tablet TAKE 1 TABLET (50 MG TOTAL) BY MOUTH 3 (THREE) TIMES DAILY AS NEEDED FOR SEVERE PAIN. 90 tablet 0   Upadacitinib  ER ([name] ) 15 MG TB24 Take 15 mg by mouth daily.     No current facility-administered medications on file prior to visit.    Review of Systems     Objective:  There were no vitals filed for this visit. BP Readings from Last 3 Encounters:  10/09/23 (!) 142/72  09/14/23 133/79  08/06/23 126/81   Wt Readings from Last 3 Encounters:  10/02/23 120 lb (54.4 kg)  09/14/23 119 lb 9.6 oz (54.3 kg)  08/06/23 115 lb (52.2 kg)   There is no height or weight on file to calculate BMI.    Physical Exam         Assessment & Plan:    See Problem List for Assessment and Plan of chronic medical problems.

## 2023-11-10 ENCOUNTER — Ambulatory Visit: Admitting: Internal Medicine

## 2023-11-10 ENCOUNTER — Encounter: Payer: Self-pay | Admitting: Internal Medicine

## 2023-11-10 ENCOUNTER — Other Ambulatory Visit: Payer: Self-pay

## 2023-11-10 ENCOUNTER — Ambulatory Visit (INDEPENDENT_AMBULATORY_CARE_PROVIDER_SITE_OTHER)

## 2023-11-10 ENCOUNTER — Other Ambulatory Visit: Payer: Self-pay | Admitting: Internal Medicine

## 2023-11-10 ENCOUNTER — Inpatient Hospital Stay (HOSPITAL_COMMUNITY)
Admission: EM | Admit: 2023-11-10 | Discharge: 2023-11-20 | DRG: 956 | Disposition: A | Discharge status: Home Health | Attending: Internal Medicine | Admitting: Internal Medicine

## 2023-11-10 VITALS — BP 102/64 | HR 68 | Temp 98.4°F | Ht 61.0 in

## 2023-11-10 DIAGNOSIS — G35 Multiple sclerosis: Secondary | ICD-10-CM | POA: Diagnosis present

## 2023-11-10 DIAGNOSIS — Z8249 Family history of ischemic heart disease and other diseases of the circulatory system: Secondary | ICD-10-CM | POA: Diagnosis not present

## 2023-11-10 DIAGNOSIS — M25551 Pain in right hip: Secondary | ICD-10-CM

## 2023-11-10 DIAGNOSIS — D62 Acute posthemorrhagic anemia: Secondary | ICD-10-CM | POA: Diagnosis not present

## 2023-11-10 DIAGNOSIS — Z885 Allergy status to narcotic agent status: Secondary | ICD-10-CM | POA: Diagnosis not present

## 2023-11-10 DIAGNOSIS — R339 Retention of urine, unspecified: Secondary | ICD-10-CM | POA: Diagnosis present

## 2023-11-10 DIAGNOSIS — I959 Hypotension, unspecified: Secondary | ICD-10-CM | POA: Diagnosis not present

## 2023-11-10 DIAGNOSIS — Z85828 Personal history of other malignant neoplasm of skin: Secondary | ICD-10-CM

## 2023-11-10 DIAGNOSIS — R609 Edema, unspecified: Secondary | ICD-10-CM

## 2023-11-10 DIAGNOSIS — T84020A Dislocation of internal right hip prosthesis, initial encounter: Secondary | ICD-10-CM | POA: Diagnosis not present

## 2023-11-10 DIAGNOSIS — S72009A Fracture of unspecified part of neck of unspecified femur, initial encounter for closed fracture: Secondary | ICD-10-CM | POA: Diagnosis present

## 2023-11-10 DIAGNOSIS — I951 Orthostatic hypotension: Secondary | ICD-10-CM | POA: Diagnosis present

## 2023-11-10 DIAGNOSIS — I10 Essential (primary) hypertension: Secondary | ICD-10-CM | POA: Diagnosis not present

## 2023-11-10 DIAGNOSIS — Y792 Prosthetic and other implants, materials and accessory orthopedic devices associated with adverse incidents: Secondary | ICD-10-CM | POA: Diagnosis not present

## 2023-11-10 DIAGNOSIS — S72011A Unspecified intracapsular fracture of right femur, initial encounter for closed fracture: Secondary | ICD-10-CM | POA: Diagnosis not present

## 2023-11-10 DIAGNOSIS — E2740 Unspecified adrenocortical insufficiency: Secondary | ICD-10-CM | POA: Diagnosis not present

## 2023-11-10 DIAGNOSIS — F419 Anxiety disorder, unspecified: Secondary | ICD-10-CM | POA: Diagnosis not present

## 2023-11-10 DIAGNOSIS — E785 Hyperlipidemia, unspecified: Secondary | ICD-10-CM | POA: Diagnosis present

## 2023-11-10 DIAGNOSIS — S52021B Displaced fracture of olecranon process without intraarticular extension of right ulna, initial encounter for open fracture type I or II: Secondary | ICD-10-CM | POA: Diagnosis present

## 2023-11-10 DIAGNOSIS — L089 Local infection of the skin and subcutaneous tissue, unspecified: Secondary | ICD-10-CM | POA: Diagnosis not present

## 2023-11-10 DIAGNOSIS — F0390 Unspecified dementia without behavioral disturbance: Secondary | ICD-10-CM | POA: Diagnosis present

## 2023-11-10 DIAGNOSIS — I739 Peripheral vascular disease, unspecified: Secondary | ICD-10-CM | POA: Diagnosis not present

## 2023-11-10 DIAGNOSIS — S51011A Laceration without foreign body of right elbow, initial encounter: Secondary | ICD-10-CM | POA: Insufficient documentation

## 2023-11-10 DIAGNOSIS — Z9842 Cataract extraction status, left eye: Secondary | ICD-10-CM

## 2023-11-10 DIAGNOSIS — Z961 Presence of intraocular lens: Secondary | ICD-10-CM | POA: Diagnosis present

## 2023-11-10 DIAGNOSIS — Z981 Arthrodesis status: Secondary | ICD-10-CM

## 2023-11-10 DIAGNOSIS — Z833 Family history of diabetes mellitus: Secondary | ICD-10-CM

## 2023-11-10 DIAGNOSIS — E876 Hypokalemia: Secondary | ICD-10-CM | POA: Diagnosis not present

## 2023-11-10 DIAGNOSIS — K219 Gastro-esophageal reflux disease without esophagitis: Secondary | ICD-10-CM | POA: Diagnosis not present

## 2023-11-10 DIAGNOSIS — M069 Rheumatoid arthritis, unspecified: Secondary | ICD-10-CM | POA: Diagnosis present

## 2023-11-10 DIAGNOSIS — Z882 Allergy status to sulfonamides status: Secondary | ICD-10-CM

## 2023-11-10 DIAGNOSIS — W1830XA Fall on same level, unspecified, initial encounter: Secondary | ICD-10-CM | POA: Diagnosis present

## 2023-11-10 DIAGNOSIS — S72001A Fracture of unspecified part of neck of right femur, initial encounter for closed fracture: Secondary | ICD-10-CM | POA: Diagnosis not present

## 2023-11-10 DIAGNOSIS — E871 Hypo-osmolality and hyponatremia: Secondary | ICD-10-CM | POA: Diagnosis present

## 2023-11-10 DIAGNOSIS — T148XXA Other injury of unspecified body region, initial encounter: Secondary | ICD-10-CM | POA: Diagnosis not present

## 2023-11-10 DIAGNOSIS — Z87891 Personal history of nicotine dependence: Secondary | ICD-10-CM | POA: Diagnosis not present

## 2023-11-10 DIAGNOSIS — R42 Dizziness and giddiness: Secondary | ICD-10-CM | POA: Diagnosis not present

## 2023-11-10 DIAGNOSIS — Y929 Unspecified place or not applicable: Secondary | ICD-10-CM

## 2023-11-10 DIAGNOSIS — Z820 Family history of epilepsy and other diseases of the nervous system: Secondary | ICD-10-CM

## 2023-11-10 DIAGNOSIS — Z880 Allergy status to penicillin: Secondary | ICD-10-CM | POA: Diagnosis not present

## 2023-11-10 DIAGNOSIS — Z888 Allergy status to other drugs, medicaments and biological substances status: Secondary | ICD-10-CM

## 2023-11-10 DIAGNOSIS — Z79899 Other long term (current) drug therapy: Secondary | ICD-10-CM

## 2023-11-10 DIAGNOSIS — Z7952 Long term (current) use of systemic steroids: Secondary | ICD-10-CM

## 2023-11-10 DIAGNOSIS — S52021H Displaced fracture of olecranon process without intraarticular extension of right ulna, subsequent encounter for open fracture type I or II with delayed healing: Secondary | ICD-10-CM | POA: Diagnosis not present

## 2023-11-10 DIAGNOSIS — M25552 Pain in left hip: Secondary | ICD-10-CM | POA: Diagnosis not present

## 2023-11-10 DIAGNOSIS — R5381 Other malaise: Secondary | ICD-10-CM | POA: Diagnosis not present

## 2023-11-10 DIAGNOSIS — S72001D Fracture of unspecified part of neck of right femur, subsequent encounter for closed fracture with routine healing: Secondary | ICD-10-CM | POA: Diagnosis not present

## 2023-11-10 DIAGNOSIS — Z974 Presence of external hearing-aid: Secondary | ICD-10-CM

## 2023-11-10 DIAGNOSIS — Z9841 Cataract extraction status, right eye: Secondary | ICD-10-CM

## 2023-11-10 DIAGNOSIS — Z9181 History of falling: Secondary | ICD-10-CM

## 2023-11-10 DIAGNOSIS — L89152 Pressure ulcer of sacral region, stage 2: Secondary | ICD-10-CM | POA: Diagnosis not present

## 2023-11-10 LAB — BASIC METABOLIC PANEL WITH GFR
Anion gap: 13 (ref 5–15)
BUN: 9 mg/dL (ref 8–23)
CO2: 29 mmol/L (ref 22–32)
Calcium: 8.6 mg/dL — ABNORMAL LOW (ref 8.9–10.3)
Chloride: 87 mmol/L — ABNORMAL LOW (ref 98–111)
Creatinine, Ser: 0.79 mg/dL (ref 0.44–1.00)
GFR, Estimated: >60 mL/min (ref >60)
Glucose, Bld: 170 mg/dL — ABNORMAL HIGH (ref 70–99)
Potassium: 3.5 mmol/L (ref 3.5–5.1)
Sodium: 129 mmol/L — ABNORMAL LOW (ref 135–145)

## 2023-11-10 LAB — CBC
HCT: 33.4 % — ABNORMAL LOW (ref 36.0–46.0)
Hemoglobin: 11 g/dL — ABNORMAL LOW (ref 12.0–15.0)
MCH: 35.1 pg — ABNORMAL HIGH (ref 26.0–34.0)
MCHC: 32.9 g/dL (ref 30.0–36.0)
MCV: 106.7 fL — ABNORMAL HIGH (ref 80.0–100.0)
Platelets: 315 10*3/uL (ref 150–400)
RBC: 3.13 MIL/uL — ABNORMAL LOW (ref 3.87–5.11)
RDW: 16.5 % — ABNORMAL HIGH (ref 11.5–15.5)
WBC: 7.3 10*3/uL (ref 4.0–10.5)
nRBC: 0 % (ref 0.0–0.2)

## 2023-11-10 MED ORDER — ONDANSETRON HCL 4 MG PO TABS: 4.0000 mg | ORAL_TABLET | Freq: Four times a day (QID) | ORAL | Status: DC | PRN

## 2023-11-10 MED ORDER — CARVEDILOL 3.125 MG PO TABS: 3.1250 mg | ORAL_TABLET | Freq: Two times a day (BID) | ORAL | 3 refills | Status: DC

## 2023-11-10 MED ORDER — PANTOPRAZOLE SODIUM 40 MG PO TBEC
40.0000 mg | DELAYED_RELEASE_TABLET | Freq: Two times a day (BID) | ORAL | Status: DC
  Administered 2023-11-11 – 2023-11-19 (×16): 40 mg via ORAL
  Filled 2023-11-10 (×16): qty 1

## 2023-11-10 MED ORDER — ACETAMINOPHEN 650 MG RE SUPP: 650.0000 mg | Freq: Four times a day (QID) | RECTAL | Status: DC | PRN

## 2023-11-10 MED ORDER — CARVEDILOL 3.125 MG PO TABS
3.1250 mg | ORAL_TABLET | Freq: Two times a day (BID) | ORAL | Status: DC
  Administered 2023-11-11 – 2023-11-13 (×3): 3.125 mg via ORAL
  Filled 2023-11-10 (×3): qty 1

## 2023-11-10 MED ORDER — DULOXETINE HCL 20 MG PO CPEP
40.0000 mg | ORAL_CAPSULE | Freq: Every day | ORAL | Status: DC
  Administered 2023-11-10 – 2023-11-19 (×10): 40 mg via ORAL
  Filled 2023-11-10 (×10): qty 2

## 2023-11-10 MED ORDER — CEPHALEXIN 500 MG PO CAPS: 500.0000 mg | ORAL_CAPSULE | Freq: Three times a day (TID) | ORAL | 0 refills | Status: AC

## 2023-11-10 MED ORDER — ACETAMINOPHEN 325 MG PO TABS
650.0000 mg | ORAL_TABLET | Freq: Four times a day (QID) | ORAL | Status: DC | PRN
  Administered 2023-11-11 – 2023-11-14 (×5): 650 mg via ORAL
  Filled 2023-11-10 (×7): qty 2

## 2023-11-10 MED ORDER — ENSURE PLUS HIGH PROTEIN PO LIQD
237.0000 mL | Freq: Two times a day (BID) | ORAL | Status: DC
  Administered 2023-11-13: 237 mL via ORAL

## 2023-11-10 MED ORDER — OXYBUTYNIN CHLORIDE 5 MG PO TABS
5.0000 mg | ORAL_TABLET | Freq: Every day | ORAL | Status: DC
  Administered 2023-11-11 – 2023-11-20 (×9): 5 mg via ORAL
  Filled 2023-11-10 (×9): qty 1

## 2023-11-10 MED ORDER — ONDANSETRON HCL 4 MG/2ML IJ SOLN
4.0000 mg | Freq: Four times a day (QID) | INTRAMUSCULAR | Status: DC | PRN
  Administered 2023-11-16 – 2023-11-20 (×2): 4 mg via INTRAVENOUS

## 2023-11-10 MED ORDER — FUROSEMIDE 20 MG PO TABS
20.0000 mg | ORAL_TABLET | Freq: Every day | ORAL | Status: DC
  Administered 2023-11-11: 20 mg via ORAL
  Filled 2023-11-10: qty 1

## 2023-11-10 MED ORDER — PREDNISONE 5 MG PO TABS
5.0000 mg | ORAL_TABLET | Freq: Every day | ORAL | Status: DC
  Administered 2023-11-11: 5 mg via ORAL
  Filled 2023-11-10: qty 1

## 2023-11-10 MED ORDER — FENTANYL CITRATE PF 50 MCG/ML IJ SOSY
25.0000 ug | PREFILLED_SYRINGE | INTRAMUSCULAR | Status: DC | PRN
  Administered 2023-11-10 – 2023-11-12 (×4): 25 ug via INTRAVENOUS
  Filled 2023-11-10 (×4): qty 1

## 2023-11-10 MED ORDER — ATORVASTATIN CALCIUM 10 MG PO TABS
10.0000 mg | ORAL_TABLET | Freq: Every day | ORAL | Status: DC
  Administered 2023-11-11 – 2023-11-20 (×9): 10 mg via ORAL
  Filled 2023-11-10 (×9): qty 1

## 2023-11-10 NOTE — Plan of Care (Signed)

## 2023-11-10 NOTE — H&P (Signed)
 History and Physical    Sherri  [name] NWG:[id number] DOB: 07-05-1940 DOA: 11/10/2023  PCP: Colene Dauphin, MD   Chief Complaint:  hip pain  HPI: Sherri  [name] is a 83 y.o. female with medical history significant of anxiety, GERD, rheumatoid arthritis, hypertension, multiple sclerosis, lipidemia who presented to the emergency department due to hip pain.  Patient was admitted in May.  During this hospitalization she was treated for cellulitis and sepsis.  She had a mechanical fall has CT scan which showed no evidence of fracture.  She been having chronic pain of her left hip and difficulty walking.  She went to her primary care doctor who ordered outpatient imaging which showed impacted hip fracture.  She was referred to the emergency department for further assessment.  On arrival she was afebrile hemodynamically stable.  Labs were obtained which showed WBC 7.3, hemoglobin 11, sodium 129.  X-ray of hip showed impacted right femoral neck fracture.  Orthopedics was consulted and plans for operative intervention.  She was admitted for further workup.   Review of Systems: Review of Systems  Constitutional: Negative.   HENT: Negative.    Eyes: Negative.   Respiratory: Negative.    Cardiovascular: Negative.   Gastrointestinal: Negative.   Genitourinary: Negative.   Musculoskeletal: Negative.   Skin: Negative.   Neurological: Negative.   Endo/Heme/Allergies: Negative.   Psychiatric/Behavioral: Negative.       As per HPI otherwise 10 point review of systems negative.   Allergies  Allergen Reactions   Darvon [Propoxyphene Hcl] Shortness Of Breath    ? Dose Related ? Lowered respirations greatly   Demerol [Meperidine] Shortness Of Breath and Other (See Comments)    Respiratory Distress   Penicillins Hives and Other (See Comments)    Has patient had a PCN reaction causing immediate rash, facial/tongue/throat swelling, SOB or lightheadedness with hypotension: No SEVERE RASH INVOLVING  MUCUS MEMBRANES or SKIN NECROSIS: #  #  #  YES  #  #  #  Has patient had a PCN reaction that required hospitalization No Has patient had a PCN reaction occurring within the last 10 years: No.    Imuran [Azathioprine] Other (See Comments)    Weakness   Sulfa  Antibiotics Nausea Only and Other (See Comments)    States had severe indigestion and heartburn and nausea and had to stop taking   Arava [Leflunomide] Other (See Comments)    Excessive weight gain   Lipitor [Atorvastatin ] Nausea And Vomiting    Currently taking 2 times weekly - able to tolerate low dose    Vytorin [Ezetimibe-Simvastatin] Nausea And Vomiting    Past Medical History:  Diagnosis Date   Anxiety    takes Valium  daily as needed   Basal cell carcinoma 01/21/1988   left nostril (MOHS), sup-right calf (CX35FU)   Basal cell carcinoma 03/22/1996   right calf (CX35FU)   Basal cell carcinoma 03/31/1995   left wing nose   Bruises easily    d/t meds   Cancer (HCC)    basal cell ca, in situ- uterine    Cataracts, bilateral    removed bilateral   Chronic back pain    Dizziness    r/t to meds   GERD (gastroesophageal reflux disease)    no meds on a regular basis but will take Tums if needed   Headache(784.0)    r/t neck issues   History of bronchitis 6-24yrs ago   History of colon polyps    benign   HOH (hard  of hearing)    wears hearing aids   Hyperlipidemia    takes Atorvastatin  on Mondays and Fridays   Hypertension    Joint pain    Joint swelling    Multiple sclerosis (HCC)    Neuromuscular disorder (HCC)    Dr. Hobson Luna- [facility], follows [facility]   Nocturia    PONV (postoperative nausea and vomiting)    trouble urinating after surgery in 2014   Postoperative nausea and vomiting 01/11/2019   Rheumatoid arthritis (HCC)    Dr Voncille Guadalajara weekly, RA- hands- knees- feet    Rheumatoid arthritis(714.0)    Dr Alvira Josephs takes Xeljanz  daily   Right wrist fracture    Skin cancer    Squamous cell  carcinoma of skin 11/02/2001   in situ-right knee (cx37fu)   Squamous cell carcinoma of skin 11/12/2011   in situ-left forearm (CX35FU), in situ-left foot (CX35FU)   Squamous cell carcinoma of skin 01/22/2012   in situ-left lower forearm (txpbx)   Squamous cell carcinoma of skin 11/17/2012   right shin (txpbx)   Squamous cell carcinoma of skin 10/28/2013   in situ-right shoulder (CX35FU)   Squamous cell carcinoma of skin 04/28/2014   well diff-right shin (txpbx)   Squamous cell carcinoma of skin 11/02/2014   in situ-Left shin (txpbx)   Squamous cell carcinoma of skin 12/15/2014   in situ-Left hand (txpbx), bowens-left side chest (txpbx)   Squamous cell carcinoma of skin 05/09/2015   in situ-left hand (txpbx)    Squamous cell carcinoma of skin 05/07/2016   in situ-left inner shin,ant, in situ-left inner shin, post, in situ-left outer forearm, in situ-right knuckle   Squamous cell carcinoma of skin 03/1302018   in situ-left shoulder (txpbx), in situ-right inner shin (txpbx), in situ-top of left foot (txpbx), KA- left forearm (txpbx)   Squamous cell carcinoma of skin 12/02/2016   in situ-left inner shin (txpbx)   Squamous cell carcinoma of skin 03/04/2017   in situ-left outer sup, shin (txpbx), in situ- right 2nd knuckle finger (txpbx), in situ- Left wrist (txpbx)   Squamous cell carcinoma of skin 04/06/2018   in situ-above left knee inner (txpbx)   Squamous cell carcinoma of skin 04/21/2018   in situ-right top hand (txpbx)   Squamous cell carcinoma of skin 07/08/2018   in situ-right lower inner shin (txpbx)   Squamous cell carcinoma of skin 04/27/2019   in situ- Left neck(CX35FU), In situ- right neck (CX35FU)   Urinary retention    sees Dr.Wrenn about 2 times a yr    Past Surgical History:  Procedure Laterality Date   ABDOMINAL HYSTERECTOMY     1985   ANTERIOR CERVICAL DECOMP/DISCECTOMY FUSION N/A 04/23/2022   Procedure: Anterior Cervical Decompression/Discectomy Fusion -  Cervical Seven-Thoracic One;  Surgeon: Agustina Aldrich, MD;  Location: [facility] OR;  Service: Neurosurgery;  Laterality: N/A;  3C   APPENDECTOMY     with TAH   BACK SURGERY     several   BREAST BIOPSY Right    Results were negative   BROW LIFT Bilateral 10/02/2016   Procedure: BILATERAL LOWER LID BLEPHAROPLASTY;  Surgeon: Karan Osgood, MD;  Location: [facility] OR;  Service: Plastics;  Laterality: Bilateral;   CARPAL TUNNEL RELEASE Right    cataracts     CERVICAL FUSION     Dr Rica Chalet   CERVICAL FUSION  12/31/2012   Dr Rica Chalet   CERVICAL FUSION  04/2022   CHEST TUBE INSERTION     for traumatic Pneumothorax  COLONOSCOPY  07/16/2010   normal    COLONOSCOPY W/ POLYPECTOMY  1997   negative since; Dr Arvie Latus   ESOPHAGOGASTRODUODENOSCOPY  07/16/2010   normal   eye lid raise     EYE SURGERY Bilateral    cataracts removed - /w IOL   HARDWARE REMOVAL  08/2021   JOINT REPLACEMENT     LUMBAR FUSION     Dr Rica Chalet   NASAL SINUS SURGERY     POSTERIOR CERVICAL FUSION/FORAMINOTOMY N/A 12/31/2012   Procedure: POSTERIOR LATERAL CERVICAL FUSION/FORAMINOTOMY LEVEL 1 CERVICAL THREE-FOUR WITH LATERAL MASS SCREWS;  Surgeon: Adelbert Adler, MD;  Location: [facility];  Service: Neurosurgery;  Laterality: N/A;   PTOSIS REPAIR Bilateral 10/02/2016   Procedure: INTERNAL PTOSIS REPAIR;  Surgeon: Karan Osgood, MD;  Location: [facility] OR;  Service: Plastics;  Laterality: Bilateral;   SKIN BIOPSY     THYROID  SURGERY     R lobe removed- 1972, has grown back - CT last done- 2018   TOTAL HIP ARTHROPLASTY  12/22/2011   Procedure: TOTAL HIP ARTHROPLASTY;  Surgeon: Ilean Mall, MD;  Location: [facility] OR;  Service: Orthopedics;  Laterality: Left;   TOTAL SHOULDER ARTHROPLASTY     TUBAL LIGATION     UPPER GASTROINTESTINAL ENDOSCOPY  2012   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   WRIST SURGERY Left 09/16/2022   wrist/arm     reports that she quit smoking about 42 years ago. Her smoking use included cigarettes. She started  smoking about 62 years ago. She has a 50 pack-year smoking history. She has never been exposed to tobacco smoke. She has never used smokeless tobacco. She reports current alcohol  use of about 14.0 standard drinks of alcohol  per week. She reports that she does not use drugs.  Family History  Problem Relation Age of Onset   Cancer Mother        pancreatic   Diabetes Mother    Pancreatic cancer Mother    Heart disease Father        Rheumatic   Cancer Father        ? stomach   Stomach cancer Father    Cancer Sister        stomach   Stomach cancer Sister    Cancer Maternal Aunt        X 4; ? primary   Heart disease Paternal Aunt    Cancer Maternal Grandmother        cervical   Colon cancer Other        Aunts   Multiple sclerosis Daughter    Hypertension Neg Hx    Stroke Neg Hx    Esophageal cancer Neg Hx    Rectal cancer Neg Hx     Prior to Admission medications   Medication Sig Start Date End Date Taking? Authorizing Provider  atorvastatin  (LIPITOR) 10 MG tablet TAKE 1 TAB ON MONDAYS AND FRIDAY 02/26/23  Yes Burns, Beckey Bourgeois, MD  Biotin  5000 MCG TABS Take 10,000 mcg by mouth in the morning.   Yes [provider]  Carboxymethylcellul-Glycerin  (LUBRICATING EYE DROPS OP) Place 1 drop into both eyes daily as needed (dry eyes).   Yes [provider]  carvedilol  (COREG ) 3.125 MG tablet Take 1 tablet (3.125 mg total) by mouth 2 (two) times daily with a meal. Patient taking differently: Take 0.5 tablets by mouth 2 (two) times daily with a meal. 11/10/23  Yes Burns, Beckey Bourgeois, MD  cephALEXin  (KEFLEX ) 500 MG capsule Take 1  capsule (500 mg total) by mouth 3 (three) times daily for 10 days. 11/10/23 11/20/23 Yes Burns, Beckey Bourgeois, MD  cholecalciferol  (VITAMIN D ) 25 MCG (1000 UNIT) tablet Take 1,000 Units by mouth in the morning.   Yes [provider]  cyclobenzaprine  (FLEXERIL ) 10 MG tablet TAKE 1 TABLET BY MOUTH THREE TIMES A DAY AS NEEDED FOR MUSCLE SPASM 06/11/23  Yes Burns,  Beckey Bourgeois, MD  denosumab  (PROLIA ) 60 MG/ML SOSY injection Inject 60 mg into the skin every 6 (six) months. Deliver to rheum: [address], [address], [place], [place] [address]. Appt on 07/23/2022 07/17/22 11/10/23 Yes Romayne Clubs, PA-C  diazepam  (VALIUM ) 5 MG tablet TAKE 1 TABLET BY MOUTH AT BEDTIME AS NEEDED FOR MUSCLE SPASMS. 04/28/23  Yes Burns, Beckey Bourgeois, MD  diclofenac sodium (VOLTAREN) 1 % GEL APPLY 3 GRAMS TO 3 LARGE JOINTS 3 TIMES A DAY AS NEEDED 01/27/18  Yes Deveshwar, Clydie Darter, MD  DULoxetine  (CYMBALTA ) 20 MG capsule Take 40 mg by mouth at bedtime.   Yes [provider]  folic acid  (FOLVITE ) 1 MG tablet Take 1 tablet (1 mg total) by mouth daily. 09/02/23  Yes Romayne Clubs, PA-C  furosemide  (LASIX ) 20 MG tablet TAKE 1 TABLET BY MOUTH EVERY DAY 08/19/23  Yes Burns, Beckey Bourgeois, MD  methotrexate  (RHEUMATREX) 2.5 MG tablet Take 5 tablets (12.5 mg total) by mouth once a week. Caution:Chemotherapy. Protect from light. 09/02/23  Yes Romayne Clubs, PA-C  oxybutynin (DITROPAN) 5 MG tablet Take 5 mg by mouth daily. 11/06/23  Yes [provider]  pantoprazole  (PROTONIX ) 40 MG tablet Take 1 tablet (40 mg total) by mouth 2 (two) times daily before a meal. 07/27/23  Yes Burns, Beckey Bourgeois, MD  predniSONE  (DELTASONE ) 5 MG tablet TAKE 1 TABLET BY MOUTH EVERY DAY IN THE MORNING 05/22/23  Yes Romayne Clubs, PA-C  traMADol  (ULTRAM ) 50 MG tablet TAKE 1 TABLET (50 MG TOTAL) BY MOUTH 3 (THREE) TIMES DAILY AS NEEDED FOR SEVERE PAIN. 11/09/23  Yes Burns, Beckey Bourgeois, MD  Upadacitinib  ER ([name] ) 15 MG TB24 Take 15 mg by mouth daily.   Yes [provider]    Physical Exam: Vitals:   11/10/23 1718 11/10/23 2133  BP: 109/71 125/83  Pulse: (!) 101 90  Resp: 18 17  Temp: 98.5 F (36.9 C) 98.8 F (37.1 C)  TempSrc: Oral Oral  SpO2: 98% 98%   Physical Exam Constitutional:      Appearance: She is normal weight.  HENT:     Head: Normocephalic.     Nose: Nose normal.     Mouth/Throat:     Mouth: Mucous  membranes are moist.     Pharynx: Oropharynx is clear.   Eyes:     Extraocular Movements: Extraocular movements intact.     Conjunctiva/sclera: Conjunctivae normal.     Pupils: Pupils are equal, round, and reactive to light.    Cardiovascular:     Rate and Rhythm: Normal rate and regular rhythm.     Pulses: Normal pulses.     Heart sounds: Normal heart sounds.  Pulmonary:     Effort: Pulmonary effort is normal.     Breath sounds: Normal breath sounds.  Abdominal:     General: Abdomen is flat.     Palpations: Abdomen is soft.   Musculoskeletal:        General: Normal range of motion.     Cervical back: Normal range of motion.   Skin:    General: Skin is  warm.     Capillary Refill: Capillary refill takes less than 2 seconds.   Neurological:     General: No focal deficit present.     Mental Status: She is alert. Mental status is at baseline.   Psychiatric:        Mood and Affect: Mood normal.        Labs on Admission: I have personally reviewed the patients's labs and imaging studies.  Assessment/Plan Principal Problem:   Hip fracture (HCC)   # Right impacted hip fracture - Patient had recent fall in May with no evidence of fracture however had persistent hip pain now with right femoral neck fracture - Orthopedics consulted  Plan: Operative intervention in the morning  # Hypertension-continue carvedilol   # Chronic pain-continue Cymbalta  As needed fentanyl   # Urinary retention-continue oxybutynin  # GERD-continue Protonix   # Rheumatoid arthritis-continue prednisone   # Hyperlipidemia-continue statin  Admission status: Observation Med-Surg  Certification: The appropriate patient status for this patient is OBSERVATION. Observation status is judged to be reasonable and necessary in order to provide the required intensity of service to ensure the patient's safety. The patient's presenting symptoms, physical exam findings, and initial radiographic and  laboratory data in the context of their medical condition is felt to place them at decreased risk for further clinical deterioration. Furthermore, it is anticipated that the patient will be medically stable for discharge from the hospital within 2 midnights of admission.     Myrl Askew MD [facility] If 7PM-7AM, please contact night-coverage [email or web address]  11/10/2023, 10:25 PM

## 2023-11-10 NOTE — Assessment & Plan Note (Signed)
 New Left parotid gland swollen and tender with some left-sided cervical lymphadenopathy Ultrasound of parotid gland ordered Keflex  500 mg 3 times daily x 10 days

## 2023-11-10 NOTE — Assessment & Plan Note (Signed)
 Acute Skin tear of anterior right elbow No evidence of infection Wound care

## 2023-11-10 NOTE — Assessment & Plan Note (Signed)
 Subacute She fell approximately 4 weeks ago and has not been able to bear any weight on her right leg since then Right lateral hip tenderness and groin tenderness Concern for fracture, which x-rays did confirm Advised her to go to the emergency room

## 2023-11-10 NOTE — ED Notes (Signed)
 ED TO INPATIENT HANDOFF REPORT  ED Nurse Name and Phone #: Marilee Showers [phone]  S Name/Age/Gender Sherri  [name] 83 y.o. female Room/Bed: WA11/WA11  Code Status   Code Status: Prior  Home/SNF/Other Home Patient oriented to: self, place, time, and situation Is this baseline? Yes   Triage Complete: Triage complete  Chief Complaint Hip fracture (HCC) [S72.009A]  Triage Note Pt sent from PCP for a positive right hip fracture from a fall 3 weeks ago. Pt also has a wound to the right arm that has a new dressing but is leaking.   Allergies Allergies  Allergen Reactions   Darvon [Propoxyphene Hcl] Shortness Of Breath    ? Dose Related ? Lowered respirations greatly   Demerol [Meperidine] Shortness Of Breath and Other (See Comments)    Respiratory Distress   Penicillins Hives and Other (See Comments)    Has patient had a PCN reaction causing immediate rash, facial/tongue/throat swelling, SOB or lightheadedness with hypotension: No SEVERE RASH INVOLVING MUCUS MEMBRANES or SKIN NECROSIS: #  #  #  YES  #  #  #  Has patient had a PCN reaction that required hospitalization No Has patient had a PCN reaction occurring within the last 10 years: No.    Imuran [Azathioprine] Other (See Comments)    Weakness   Sulfa  Antibiotics Nausea Only and Other (See Comments)    States had severe indigestion and heartburn and nausea and had to stop taking   Arava [Leflunomide] Other (See Comments)    Excessive weight gain   Lipitor [Atorvastatin ] Nausea And Vomiting    Currently taking 2 times weekly - able to tolerate low dose    Vytorin [Ezetimibe-Simvastatin] Nausea And Vomiting    Level of Care/Admitting Diagnosis ED Disposition     ED Disposition  Admit   Condition  --   Comment  Hospital Area: [facility] [[id number]]  Level of Care: Med-Surg [16]  May place patient in observation at [facility] or [facility] if equivalent level of care is available:: No  Covid  Evaluation: Asymptomatic - no recent exposure (last 10 days) testing not required  Diagnosis: Hip fracture [facility]) [[id number]]  Admitting Physician: Myrl Askew [[id number]]  Attending Physician: Myrl Askew [phone]          B Medical/Surgery History Past Medical History:  Diagnosis Date   Anxiety    takes Valium  daily as needed   Basal cell carcinoma 01/21/1988   left nostril (MOHS), sup-right calf (CX35FU)   Basal cell carcinoma 03/22/1996   right calf (CX35FU)   Basal cell carcinoma 03/31/1995   left wing nose   Bruises easily    d/t meds   Cancer (HCC)    basal cell ca, in situ- uterine    Cataracts, bilateral    removed bilateral   Chronic back pain    Dizziness    r/t to meds   GERD (gastroesophageal reflux disease)    no meds on a regular basis but will take Tums if needed   Headache(784.0)    r/t neck issues   History of bronchitis 6-77yrs ago   History of colon polyps    benign   HOH (hard of hearing)    wears hearing aids   Hyperlipidemia    takes Atorvastatin  on Mondays and Fridays   Hypertension    Joint pain    Joint swelling    Multiple sclerosis (HCC)    Neuromuscular disorder (HCC)    Dr. Hobson Luna- [facility], follows  [facility]   Nocturia    PONV (postoperative nausea and vomiting)    trouble urinating after surgery in 2014   Postoperative nausea and vomiting 01/11/2019   Rheumatoid arthritis (HCC)    Dr Voncille Guadal[place]ara weekly, RA- hands- knees- feet    Rheumatoid arthritis(714.0)    Dr Alvira Josephs takes Xeljanz  daily   Right wrist fracture    Skin cancer    Squamous cell carcinoma of skin 11/02/2001   in situ-right knee (cx65fu)   Squamous cell carcinoma of skin 11/12/2011   in situ-left forearm (CX35FU), in situ-left foot (CX35FU)   Squamous cell carcinoma of skin 01/22/2012   in situ-left lower forearm (txpbx)   Squamous cell carcinoma of skin 11/17/2012   right shin (txpbx)   Squamous cell carcinoma of skin 10/28/2013   in  situ-right shoulder (CX35FU)   Squamous cell carcinoma of skin 04/28/2014   well diff-right shin (txpbx)   Squamous cell carcinoma of skin 11/02/2014   in situ-Left shin (txpbx)   Squamous cell carcinoma of skin 12/15/2014   in situ-Left hand (txpbx), bowens-left side chest (txpbx)   Squamous cell carcinoma of skin 05/09/2015   in situ-left hand (txpbx)    Squamous cell carcinoma of skin 05/07/2016   in situ-left inner shin,ant, in situ-left inner shin, post, in situ-left outer forearm, in situ-right knuckle   Squamous cell carcinoma of skin 03/1302018   in situ-left shoulder (txpbx), in situ-right inner shin (txpbx), in situ-top of left foot (txpbx), KA- left forearm (txpbx)   Squamous cell carcinoma of skin 12/02/2016   in situ-left inner shin (txpbx)   Squamous cell carcinoma of skin 03/04/2017   in situ-left outer sup, shin (txpbx), in situ- right 2nd knuckle finger (txpbx), in situ- Left wrist (txpbx)   Squamous cell carcinoma of skin 04/06/2018   in situ-above left knee inner (txpbx)   Squamous cell carcinoma of skin 04/21/2018   in situ-right top hand (txpbx)   Squamous cell carcinoma of skin 07/08/2018   in situ-right lower inner shin (txpbx)   Squamous cell carcinoma of skin 04/27/2019   in situ- Left neck(CX35FU), In situ- right neck (CX35FU)   Urinary retention    sees Dr.Wrenn about 2 times a yr   Past Surgical History:  Procedure Laterality Date   ABDOMINAL HYSTERECTOMY     1985   ANTERIOR CERVICAL DECOMP/DISCECTOMY FUSION N/A 04/23/2022   Procedure: Anterior Cervical Decompression/Discectomy Fusion - Cervical Seven-Thoracic One;  Surgeon: Agustina Aldrich, MD;  Location: [facility] OR;  Service: Neurosurgery;  Laterality: N/A;  3C   APPENDECTOMY     with TAH   BACK SURGERY     several   BREAST BIOPSY Right    Results were negative   BROW LIFT Bilateral 10/02/2016   Procedure: BILATERAL LOWER LID BLEPHAROPLASTY;  Surgeon: Karan Osgood, MD;  Location: [facility] OR;  Service:  Plastics;  Laterality: Bilateral;   CARPAL TUNNEL RELEASE Right    cataracts     CERVICAL FUSION     Dr Rica Chalet   CERVICAL FUSION  12/31/2012   Dr Rica Chalet   CERVICAL FUSION  04/2022   CHEST TUBE INSERTION     for traumatic Pneumothorax   COLONOSCOPY  07/16/2010   normal    COLONOSCOPY W/ POLYPECTOMY  1997   negative since; Dr Arvie Latus   ESOPHAGOGASTRODUODENOSCOPY  07/16/2010   normal   eye lid raise     EYE SURGERY Bilateral    cataracts removed - /w IOL   HARDWARE REMOVAL  08/2021  JOINT REPLACEMENT     LUMBAR FUSION     Dr Rica Chalet   NASAL SINUS SURGERY     POSTERIOR CERVICAL FUSION/FORAMINOTOMY N/A 12/31/2012   Procedure: POSTERIOR LATERAL CERVICAL FUSION/FORAMINOTOMY LEVEL 1 CERVICAL THREE-FOUR WITH LATERAL MASS SCREWS;  Surgeon: Adelbert Adler, MD;  Location: [facility];  Service: Neurosurgery;  Laterality: N/A;   PTOSIS REPAIR Bilateral 10/02/2016   Procedure: INTERNAL PTOSIS REPAIR;  Surgeon: Karan Osgood, MD;  Location: [facility] OR;  Service: Plastics;  Laterality: Bilateral;   SKIN BIOPSY     THYROID  SURGERY     R lobe removed- 1972, has grown back - CT last done- 2018   TOTAL HIP ARTHROPLASTY  12/22/2011   Procedure: TOTAL HIP ARTHROPLASTY;  Surgeon: Ilean Mall, MD;  Location: [facility] OR;  Service: Orthopedics;  Laterality: Left;   TOTAL SHOULDER ARTHROPLASTY     TUBAL LIGATION     UPPER GASTROINTESTINAL ENDOSCOPY  2012   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 Pt reported pain to LLE. For wound consults. 10/03/23  0030  -- 38    Wound / Incision (Open or Dehisced) 10/07/23 Skin tear Arm Anterior;Left;Lower 10/07/23  1136  Arm  34   Wound / Incision (Open or Dehisced) 10/07/23 Skin tear Arm Lower;Posterior;Right deep wound 10/07/23  1844  Arm  34            Intake/Output Last 24 hours No intake or output data in the 24 hours ending 11/10/23 2040  Labs/Imaging Results for orders placed or performed during the hospital encounter of 11/10/23 (from the past 48 hours)  CBC     Status: Abnormal   Collection Time: 11/10/23  6:10 PM  Result Value Ref Range   WBC 7.3 4.0 - 10.5 K/uL   RBC 3.13 (L) 3.87 - 5.11 MIL/uL   Hemoglobin 11.0 (L) 12.0 - 15.0 g/dL   HCT 96.2 (L) 95.2 - 84.1 %   MCV 106.7 (H) 80.0 - 100.0 fL   MCH 35.1 (H) 26.0 - 34.0 pg   MCHC 32.9 30.0 - 36.0 g/dL   RDW 32.4 (H) 40.1 - 02.7 %   Platelets 315 150 - 400 K/uL   nRBC 0.0 0.0 - 0.2 %    Comment: Performed at [facility], [address], [place], [place] [address]  Basic metabolic panel     Status: Abnormal   Collection Time: 11/10/23  6:10 PM  Result Value Ref Range   Sodium 129 (L) 135 - 145 mmol/L   Potassium 3.5 3.5 - 5.1 mmol/L   Chloride 87 (L) 98 - 111 mmol/L   CO2 29 22 - 32 mmol/L   Glucose, Bld 170 (H) 70 - 99 mg/dL    Comment: Glucose reference range applies only to samples taken after fasting for at least 8 hours.   BUN 9 8 - 23 mg/dL   Creatinine, Ser 4.40 0.44 - 1.00 mg/dL   Calcium  8.6 (L) 8.9 - 10.3 mg/dL   GFR, Estimated >34 >  60 mL/min    Comment: (NOTE) Calculated using the CKD-EPI Creatinine Equation (2021)    Anion gap 13 5 - 15    Comment: Performed at [facility], [address], [place], [place] [address]   *Note: Due to a large number of results and/or encounters for the requested time period, some results have not been displayed. A complete set of results can be found in Results Review.   DG Pelvis 1-2 Views Result Date: 11/10/2023 CLINICAL DATA:  Right hip pain  since fall 4 weeks ago. EXAM: PELVIS - 1-2 VIEW COMPARISON:  Hip series today FINDINGS: Postoperative changes from posterior fusion in the lower lumbar spine. Prior left hip replacement. No hardware complicating feature. Foreshortening of the right femoral neck compatible with impacted femoral neck fracture. No subluxation or dislocation. IMPRESSION: Impacted right femoral neck fracture. Electronically Signed   By: Janeece Mechanic M.D.   On: 11/10/2023 16:39   DG HIP UNILAT WITH PELVIS 2-3 VIEWS RIGHT Result Date: 11/10/2023 CLINICAL DATA:  Fall 4 weeks ago, right hip pain EXAM: DG HIP (WITH OR WITHOUT PELVIS) 2-3V RIGHT COMPARISON:  07/12/2021 FINDINGS: There is foreshortening of the femoral neck most compatible with impacted femoral neck fracture. No subluxation or dislocation. IMPRESSION: Impacted right femoral neck fracture. Electronically Signed   By: Janeece Mechanic M.D.   On: 11/10/2023 16:39    Pending Labs Unresulted Labs (From admission, onward)    None       Vitals/Pain Today's Vitals   11/10/23 1718 11/10/23 1723 11/10/23 2039  BP: 109/71    Pulse: (!) 101    Resp: 18    Temp: 98.5 F (36.9 C)    TempSrc: Oral    SpO2: 98%    PainSc:  10-Worst pain ever 10-Worst pain ever    Isolation Precautions No active isolations  Medications Medications  fentaNYL  (SUBLIMAZE ) injection 25 mcg (25 mcg Intravenous Given 11/10/23 2039)    Mobility non-ambulatory     Focused Assessments    R Recommendations: See Admitting Provider Note  Report given to:   Additional Notes:

## 2023-11-10 NOTE — ED Provider Notes (Signed)
 [place] EMERGENCY DEPARTMENT AT [facility] Provider Note   CSN: [id number] Arrival date & time: 11/10/23  1712     Patient presents with: Hip Pain   Sherri [name] is a 83 y.o. female.    Hip Pain     Patient has a history of multiple sclerosis rheumatoid arthritis, hypertension hyperlipidemia, chronic back pain, acid reflux, skin cancer.  Patient was admitted to the hospital back on May 9.  Patient was treated for cellulitis and sepsis.  Patient also had a mechanical fall prior to her hospitalization.  During her evaluation patient had CT scans of her  right lower extremity.  Did not show any acute fracture at that time.  Patient states she was in rehab.  She went to follow-up with her primary care doctor today.  Patient had still been having pain in her right hip and having difficulty ambulating.  Patient had an outpatient imaging test that showed an impacted hip fracture.  Patient was sent to the ED for evaluation.  Patient denies any falls since her initial hospitalization last month  Prior to Admission medications   Medication Sig Start Date End Date Taking? Authorizing Provider  atorvastatin  (LIPITOR) 10 MG tablet TAKE 1 TAB ON MONDAYS AND FRIDAY 02/26/23   Colene Dauphin, MD  Biotin  5000 MCG TABS Take 10,000 mcg by mouth every evening.    [provider]  Carboxymethylcellul-Glycerin  (LUBRICATING EYE DROPS OP) Place 1 drop into both eyes daily as needed (dry eyes).    [provider]  carvedilol  (COREG ) 3.125 MG tablet Take 1 tablet (3.125 mg total) by mouth 2 (two) times daily with a meal. 11/10/23   Burns, Beckey Bourgeois, MD  cephALEXin  (KEFLEX ) 500 MG capsule Take 1 capsule (500 mg total) by mouth 3 (three) times daily for 10 days. 11/10/23 11/20/23  Colene Dauphin, MD  cholecalciferol  (VITAMIN D ) 25 MCG (1000 UNIT) tablet Take 1,000 Units by mouth in the morning.    [provider]  cyclobenzaprine  (FLEXERIL ) 10 MG tablet TAKE 1 TABLET BY  MOUTH THREE TIMES A DAY AS NEEDED FOR MUSCLE SPASM 06/11/23   Colene Dauphin, MD  denosumab  (PROLIA ) 60 MG/ML SOSY injection Inject 60 mg into the skin every 6 (six) months. Deliver to rheum: [address], [place], [place] [address]. Appt on 07/23/2022 07/17/22 11/09/23  Romayne Clubs, PA-C  diazepam  (VALIUM ) 5 MG tablet TAKE 1 TABLET BY MOUTH AT BEDTIME AS NEEDED FOR MUSCLE SPASMS. 04/28/23   Colene Dauphin, MD  diclofenac sodium (VOLTAREN) 1 % GEL APPLY 3 GRAMS TO 3 LARGE JOINTS 3 TIMES A DAY AS NEEDED 01/27/18   Nicholas Bari, MD  doxycycline  (VIBRA -TABS) 100 MG tablet Take 1 tablet (100 mg total) by mouth every 12 (twelve) hours. 10/07/23   Jodeane Mulligan, DO  DULoxetine  (CYMBALTA ) 30 MG capsule TAKE 1 CAPSULE BY MOUTH EVERY DAY 06/04/23   Smith, Zachary M, DO  folic acid  (FOLVITE ) 1 MG tablet Take 1 tablet (1 mg total) by mouth daily. 09/02/23   Romayne Clubs, PA-C  furosemide  (LASIX ) 20 MG tablet TAKE 1 TABLET BY MOUTH EVERY DAY 08/19/23   Colene Dauphin, MD  methotrexate  (RHEUMATREX) 2.5 MG tablet Take 5 tablets (12.5 mg total) by mouth once a week. Caution:Chemotherapy. Protect from light. 09/02/23   Romayne Clubs, PA-C  pantoprazole  (PROTONIX ) 40 MG tablet Take 1 tablet (40 mg total) by mouth 2 (two) times daily before a meal. 07/27/23  Colene Dauphin, MD  predniSONE  (DELTASONE ) 5 MG tablet TAKE 1 TABLET BY MOUTH EVERY DAY IN THE MORNING 05/22/23   Romayne Clubs, PA-C  traMADol  (ULTRAM ) 50 MG tablet TAKE 1 TABLET (50 MG TOTAL) BY MOUTH 3 (THREE) TIMES DAILY AS NEEDED FOR SEVERE PAIN. 11/09/23   Colene Dauphin, MD  Upadacitinib  ER ([name] ) 15 MG TB24 Take 15 mg by mouth daily.    [provider]    Allergies: Darvon [propoxyphene hcl], Demerol [meperidine], Penicillins, Imuran [azathioprine], Sulfa  antibiotics, Arava [leflunomide], Lipitor [atorvastatin ], and Vytorin [ezetimibe-simvastatin]    Review of Systems  Updated Vital Signs BP 109/71 (BP Location: Left Arm)   Pulse (!)  101   Temp 98.5 F (36.9 C) (Oral)   Resp 18   SpO2 98%   Physical Exam Vitals and nursing note reviewed.  Constitutional:      General: She is not in acute distress.    Appearance: She is well-developed.  HENT:     Head: Normocephalic and atraumatic.     Right Ear: External ear normal.     Left Ear: External ear normal.   Eyes:     General: No scleral icterus.       Right eye: No discharge.        Left eye: No discharge.     Conjunctiva/sclera: Conjunctivae normal.   Neck:     Trachea: No tracheal deviation.   Cardiovascular:     Rate and Rhythm: Normal rate and regular rhythm.  Pulmonary:     Effort: Pulmonary effort is normal. No respiratory distress.     Breath sounds: No stridor.  Abdominal:     General: There is no distension.   Musculoskeletal:        General: Tenderness present. No swelling or deformity.     Cervical back: Neck supple.     Comments: Tenderness to palpation right hip but no edema neurovascular intact   Skin:    General: Skin is warm and dry.     Findings: No rash.   Neurological:     Mental Status: She is alert. Mental status is at baseline.     Cranial Nerves: No dysarthria or facial asymmetry.     Motor: No seizure activity.     (all labs ordered are listed, but only abnormal results are displayed) Labs Reviewed  CBC - Abnormal; Notable for the following components:      Result Value   RBC 3.13 (*)    Hemoglobin 11.0 (*)    HCT 33.4 (*)    MCV 106.7 (*)    MCH 35.1 (*)    RDW 16.5 (*)    All other components within normal limits  BASIC METABOLIC PANEL WITH GFR - Abnormal; Notable for the following components:   Sodium 129 (*)    Chloride 87 (*)    Glucose, Bld 170 (*)    Calcium  8.6 (*)    All other components within normal limits    EKG: None  Radiology: DG Pelvis 1-2 Views Result Date: 11/10/2023 CLINICAL DATA:  Right hip pain since fall 4 weeks ago. EXAM: PELVIS - 1-2 VIEW COMPARISON:  Hip series today FINDINGS:  Postoperative changes from posterior fusion in the lower lumbar spine. Prior left hip replacement. No hardware complicating feature. Foreshortening of the right femoral neck compatible with impacted femoral neck fracture. No subluxation or dislocation. IMPRESSION: Impacted right femoral neck fracture. Electronically Signed   By: Janeece Mechanic M.D.   On: 11/10/2023 16:39  DG HIP UNILAT WITH PELVIS 2-3 VIEWS RIGHT Result Date: 11/10/2023 CLINICAL DATA:  Fall 4 weeks ago, right hip pain EXAM: DG HIP (WITH OR WITHOUT PELVIS) 2-3V RIGHT COMPARISON:  07/12/2021 FINDINGS: There is foreshortening of the femoral neck most compatible with impacted femoral neck fracture. No subluxation or dislocation. IMPRESSION: Impacted right femoral neck fracture. Electronically Signed   By: Janeece Mechanic M.D.   On: 11/10/2023 16:39     Procedures   Medications Ordered in the ED - No data to display  Clinical Course as of 11/10/23 1931  Tue Nov 10, 2023  1725 Palpation x-rays show findings of an impacted right femoral neck fracture [JK]  1759 Case discussed with Dr. Maryjane Snider.  He reviewed images.  Pt has seen Dr Brunilda Capra in the past.  Requests I called his group  [JK]  1830 Case discussed with Dr Mildred All.  Will see pt in consultation [JK]  1856 CBC(!) Hemoglobin stable [JK]  1923 Basic metabolic panel(!) Metabolic panel shows chronic hyponatremia.  Similar to previous values [JK]    Clinical Course User Index [JK] Trish Furl, MD                                 Medical Decision Making Problems Addressed: Closed fracture of right hip, initial encounter [facility]): acute illness or injury that poses a threat to life or bodily functions  Amount and/or Complexity of Data Reviewed Labs: ordered. Decision-making details documented in ED Course.  Risk Decision regarding hospitalization.   Patient presented to the ED for evaluation of a hip fracture.  Patient fell prior to her hospitalization last month.  Patient states  she has been having persistent pain since then.  I reviewed the previous visit and she did have a CT scan of her hip at that time.  She denies any falls since then.  Outpatient x-rays today do demonstrate an impacted femoral neck fracture.  Patient otherwise appears medically stable.  She is not on anticoagulation.  On consult with orthopedics and the hospitalist service.  Will plan on admission for further treatment     Final diagnoses:  Closed fracture of right hip, initial encounter [facility])    ED Discharge Orders     None          Trish Furl, MD 11/10/23 1931

## 2023-11-10 NOTE — Assessment & Plan Note (Signed)
 Chronic Home from rehab approximately 1 week and did not do any physical therapy She fell just when she got to rehab and has not been able to bear any weight on her right leg-concern for fracture Will need extensive PT

## 2023-11-10 NOTE — Patient Instructions (Addendum)
   GO TO [place] ED FOR RIGHT HIP FRACTURE - XRAYS IN CHART    Have xrays down downstairs    Medications changes include :   keflex  500 mg three times a day.  Decrease carvedilol  to 3.125 mg twice daily  An Ultrasound of your parotid gland was ordered and someone will call you to schedule an appointment.    If your leg pain is not getting better let me know so we can have you see orthopedics/sports medicine.     Return if symptoms worsen or fail to improve.

## 2023-11-10 NOTE — Assessment & Plan Note (Signed)
 Chronic Blood pressure on the low side and she is experiencing some lightheadedness with standing Decrease carvedilol  to 3.125 mg twice daily

## 2023-11-10 NOTE — Assessment & Plan Note (Signed)
 Acute Right posterior elbow There is active discharge Start Keflex  500 mg 3 times daily x 10 days Advised her caregiver to monitor closely

## 2023-11-10 NOTE — ED Triage Notes (Signed)
 Pt sent from PCP for a positive right hip fracture from a fall 3 weeks ago. Pt also has a wound to the right arm that has a new dressing but is leaking.

## 2023-11-10 NOTE — Assessment & Plan Note (Signed)
 New Having occasional lightheadedness when she stands up Likely related to hypotension Decrease carvedilol  to 3.125 mg twice daily

## 2023-11-11 ENCOUNTER — Encounter (HOSPITAL_COMMUNITY): Payer: Self-pay | Admitting: Internal Medicine

## 2023-11-11 DIAGNOSIS — E785 Hyperlipidemia, unspecified: Secondary | ICD-10-CM | POA: Diagnosis not present

## 2023-11-11 DIAGNOSIS — F419 Anxiety disorder, unspecified: Secondary | ICD-10-CM | POA: Diagnosis present

## 2023-11-11 DIAGNOSIS — E871 Hypo-osmolality and hyponatremia: Secondary | ICD-10-CM | POA: Diagnosis present

## 2023-11-11 DIAGNOSIS — S72001A Fracture of unspecified part of neck of right femur, initial encounter for closed fracture: Secondary | ICD-10-CM | POA: Diagnosis not present

## 2023-11-11 DIAGNOSIS — Z96641 Presence of right artificial hip joint: Secondary | ICD-10-CM | POA: Diagnosis not present

## 2023-11-11 DIAGNOSIS — S52001A Unspecified fracture of upper end of right ulna, initial encounter for closed fracture: Secondary | ICD-10-CM | POA: Diagnosis not present

## 2023-11-11 DIAGNOSIS — Z882 Allergy status to sulfonamides status: Secondary | ICD-10-CM | POA: Diagnosis not present

## 2023-11-11 DIAGNOSIS — I951 Orthostatic hypotension: Secondary | ICD-10-CM | POA: Diagnosis present

## 2023-11-11 DIAGNOSIS — Z880 Allergy status to penicillin: Secondary | ICD-10-CM | POA: Diagnosis not present

## 2023-11-11 DIAGNOSIS — K219 Gastro-esophageal reflux disease without esophagitis: Secondary | ICD-10-CM | POA: Diagnosis present

## 2023-11-11 DIAGNOSIS — Z9889 Other specified postprocedural states: Secondary | ICD-10-CM | POA: Diagnosis not present

## 2023-11-11 DIAGNOSIS — Z96643 Presence of artificial hip joint, bilateral: Secondary | ICD-10-CM | POA: Diagnosis not present

## 2023-11-11 DIAGNOSIS — S72011A Unspecified intracapsular fracture of right femur, initial encounter for closed fracture: Secondary | ICD-10-CM | POA: Diagnosis not present

## 2023-11-11 DIAGNOSIS — Z87891 Personal history of nicotine dependence: Secondary | ICD-10-CM | POA: Diagnosis not present

## 2023-11-11 DIAGNOSIS — G9341 Metabolic encephalopathy: Secondary | ICD-10-CM | POA: Diagnosis not present

## 2023-11-11 DIAGNOSIS — Z85828 Personal history of other malignant neoplasm of skin: Secondary | ICD-10-CM | POA: Diagnosis not present

## 2023-11-11 DIAGNOSIS — L03115 Cellulitis of right lower limb: Secondary | ICD-10-CM | POA: Diagnosis not present

## 2023-11-11 DIAGNOSIS — Z8249 Family history of ischemic heart disease and other diseases of the circulatory system: Secondary | ICD-10-CM | POA: Diagnosis not present

## 2023-11-11 DIAGNOSIS — L89152 Pressure ulcer of sacral region, stage 2: Secondary | ICD-10-CM | POA: Diagnosis present

## 2023-11-11 DIAGNOSIS — J9602 Acute respiratory failure with hypercapnia: Secondary | ICD-10-CM | POA: Diagnosis not present

## 2023-11-11 DIAGNOSIS — M810 Age-related osteoporosis without current pathological fracture: Secondary | ICD-10-CM | POA: Diagnosis not present

## 2023-11-11 DIAGNOSIS — S52021B Displaced fracture of olecranon process without intraarticular extension of right ulna, initial encounter for open fracture type I or II: Secondary | ICD-10-CM | POA: Diagnosis not present

## 2023-11-11 DIAGNOSIS — Z885 Allergy status to narcotic agent status: Secondary | ICD-10-CM | POA: Diagnosis not present

## 2023-11-11 DIAGNOSIS — I739 Peripheral vascular disease, unspecified: Secondary | ICD-10-CM | POA: Diagnosis not present

## 2023-11-11 DIAGNOSIS — S52021H Displaced fracture of olecranon process without intraarticular extension of right ulna, subsequent encounter for open fracture type I or II with delayed healing: Secondary | ICD-10-CM | POA: Diagnosis not present

## 2023-11-11 DIAGNOSIS — Z931 Gastrostomy status: Secondary | ICD-10-CM | POA: Diagnosis not present

## 2023-11-11 DIAGNOSIS — S52021A Displaced fracture of olecranon process without intraarticular extension of right ulna, initial encounter for closed fracture: Secondary | ICD-10-CM | POA: Diagnosis not present

## 2023-11-11 DIAGNOSIS — Y792 Prosthetic and other implants, materials and accessory orthopedic devices associated with adverse incidents: Secondary | ICD-10-CM | POA: Diagnosis not present

## 2023-11-11 DIAGNOSIS — T84020A Dislocation of internal right hip prosthesis, initial encounter: Secondary | ICD-10-CM | POA: Diagnosis not present

## 2023-11-11 DIAGNOSIS — G35 Multiple sclerosis: Secondary | ICD-10-CM | POA: Diagnosis not present

## 2023-11-11 DIAGNOSIS — I10 Essential (primary) hypertension: Secondary | ICD-10-CM | POA: Diagnosis not present

## 2023-11-11 DIAGNOSIS — M069 Rheumatoid arthritis, unspecified: Secondary | ICD-10-CM | POA: Diagnosis not present

## 2023-11-11 DIAGNOSIS — F0390 Unspecified dementia without behavioral disturbance: Secondary | ICD-10-CM | POA: Diagnosis present

## 2023-11-11 DIAGNOSIS — S42401A Unspecified fracture of lower end of right humerus, initial encounter for closed fracture: Secondary | ICD-10-CM | POA: Diagnosis not present

## 2023-11-11 DIAGNOSIS — M25552 Pain in left hip: Secondary | ICD-10-CM | POA: Diagnosis present

## 2023-11-11 DIAGNOSIS — Z974 Presence of external hearing-aid: Secondary | ICD-10-CM | POA: Diagnosis not present

## 2023-11-11 DIAGNOSIS — J9601 Acute respiratory failure with hypoxia: Secondary | ICD-10-CM | POA: Diagnosis not present

## 2023-11-11 DIAGNOSIS — D62 Acute posthemorrhagic anemia: Secondary | ICD-10-CM | POA: Diagnosis not present

## 2023-11-11 DIAGNOSIS — W1830XA Fall on same level, unspecified, initial encounter: Secondary | ICD-10-CM | POA: Diagnosis present

## 2023-11-11 DIAGNOSIS — Y929 Unspecified place or not applicable: Secondary | ICD-10-CM | POA: Diagnosis not present

## 2023-11-11 DIAGNOSIS — I959 Hypotension, unspecified: Secondary | ICD-10-CM | POA: Diagnosis not present

## 2023-11-11 DIAGNOSIS — Z888 Allergy status to other drugs, medicaments and biological substances status: Secondary | ICD-10-CM | POA: Diagnosis not present

## 2023-11-11 DIAGNOSIS — E2740 Unspecified adrenocortical insufficiency: Secondary | ICD-10-CM | POA: Diagnosis not present

## 2023-11-11 DIAGNOSIS — S72009A Fracture of unspecified part of neck of unspecified femur, initial encounter for closed fracture: Secondary | ICD-10-CM | POA: Diagnosis not present

## 2023-11-11 DIAGNOSIS — Z981 Arthrodesis status: Secondary | ICD-10-CM | POA: Diagnosis not present

## 2023-11-11 DIAGNOSIS — Z471 Aftercare following joint replacement surgery: Secondary | ICD-10-CM | POA: Diagnosis not present

## 2023-11-11 DIAGNOSIS — S73004A Unspecified dislocation of right hip, initial encounter: Secondary | ICD-10-CM | POA: Diagnosis not present

## 2023-11-11 DIAGNOSIS — A419 Sepsis, unspecified organism: Secondary | ICD-10-CM | POA: Diagnosis not present

## 2023-11-11 DIAGNOSIS — S52031K Displaced fracture of olecranon process with intraarticular extension of right ulna, subsequent encounter for closed fracture with nonunion: Secondary | ICD-10-CM | POA: Diagnosis not present

## 2023-11-11 DIAGNOSIS — E876 Hypokalemia: Secondary | ICD-10-CM | POA: Diagnosis not present

## 2023-11-11 LAB — SEDIMENTATION RATE: Sed Rate: 41 mm/h — ABNORMAL HIGH (ref 0–22)

## 2023-11-11 LAB — C-REACTIVE PROTEIN: CRP: 1.3 mg/dL — ABNORMAL HIGH (ref <1.0)

## 2023-11-11 MED ORDER — MEDIHONEY WOUND/BURN DRESSING EX PSTE
1.0000 | PASTE | Freq: Every day | CUTANEOUS | Status: DC
  Administered 2023-11-11 – 2023-11-19 (×8): 1 via TOPICAL
  Filled 2023-11-11: qty 44

## 2023-11-11 MED ORDER — DOCUSATE SODIUM 100 MG PO CAPS
100.0000 mg | ORAL_CAPSULE | Freq: Every day | ORAL | Status: DC
  Administered 2023-11-11 – 2023-11-20 (×9): 100 mg via ORAL
  Filled 2023-11-11 (×9): qty 1

## 2023-11-11 MED ORDER — TRAMADOL HCL 50 MG PO TABS
50.0000 mg | ORAL_TABLET | Freq: Four times a day (QID) | ORAL | Status: DC | PRN
  Administered 2023-11-11: 50 mg via ORAL
  Filled 2023-11-11 (×2): qty 1

## 2023-11-11 MED ORDER — BISACODYL 10 MG RE SUPP
10.0000 mg | Freq: Once | RECTAL | Status: AC
  Administered 2023-11-11: 10 mg via RECTAL
  Filled 2023-11-11: qty 1

## 2023-11-11 NOTE — Progress Notes (Signed)
 [facility]  PROGRESS NOTE  Sherri  [name] XBJ:[id number] DOB: 14-Mar-1941 DOA: 11/10/2023 PCP: Colene Dauphin, MD   Brief HPI:   83 y.o. female with medical history significant of anxiety, GERD, rheumatoid arthritis, hypertension, multiple sclerosis, lipidemia who presented to the emergency department due to hip pain.  Patient was admitted in May.  During this hospitalization she was treated for cellulitis and sepsis.  She had a mechanical fall has CT scan which showed no evidence of fracture.  She been having chronic pain of her left hip and difficulty walking.  She went to her primary care doctor who ordered outpatient imaging which showed impacted hip fracture.  She was referred to the emergency department for further assessment.     Assessment/Plan:    Right impacted hip fracture - Patient had recent fall in May with no evidence of fracture however had persistent hip pain now with right femoral neck fracture - Orthopedics consulted -Plan for arthroplasty per orthopedics     # Hypertension -continue carvedilol    # Chronic pain -continue Cymbalta  As needed fentanyl    # Urinary retention -continue oxybutynin   # GERD -continue Protonix    # Rheumatoid arthritis -continue prednisone    # Hyperlipidemia -continue statin      Medications     atorvastatin   10 mg Oral Daily   carvedilol   3.125 mg Oral BID WC   DULoxetine   40 mg Oral QHS   feeding supplement  237 mL Oral BID BM   furosemide   20 mg Oral Daily   oxybutynin  5 mg Oral Daily   pantoprazole   40 mg Oral BID AC   predniSONE   5 mg Oral Q breakfast     Data Reviewed:   CBG:  No results for input(s): GLUCAP in the last 168 hours.  SpO2: 95 %    Vitals:   11/10/23 2133 11/10/23 2258 11/10/23 2334 11/11/23 0545  BP: 125/83  130/76 (!) 148/73  Pulse: 90  95 92  Resp: 17  16 14   Temp: 98.8 F (37.1 C)  100.2 F (37.9 C) 98.3 F (36.8 C)  TempSrc: Oral  Oral Oral  SpO2: 98%  99% 95%   Weight:  48.6 kg    Height:  5' 1 (1.549 m)        Data Reviewed:  Basic Metabolic Panel: Recent Labs  Lab 11/10/23 1810  NA 129*  K 3.5  CL 87*  CO2 29  GLUCOSE 170*  BUN 9  CREATININE 0.79  CALCIUM  8.6*    CBC: Recent Labs  Lab 11/10/23 1810  WBC 7.3  HGB 11.0*  HCT 33.4*  MCV 106.7*  PLT 315    LFT No results for input(s): AST, ALT, ALKPHOS, BILITOT, PROT, ALBUMIN in the last 168 hours.   Antibiotics: Anti-infectives (From admission, onward)    None        DVT prophylaxis: SCDs  Code Status: Full code  Family Communication: No family at bedside   CONSULTS orthopedics   Subjective   Denies any pain   Objective    Physical Examination:   General-appears in no acute distress Heart-S1-S2, regular, no murmur auscultated Lungs-clear to auscultation bilaterally, no wheezing or crackles auscultated Abdomen-soft, nontender, no organomegaly Extremities-right elbow in dressing Neuro-alert, oriented x3, no focal deficit noted  Status is: Inpatient:             Sherri [name]   [facility] If 7PM-7AM, please contact night-coverage at [email or web address], Office  [phone]   11/11/2023, 9:07  AM  LOS: 0 days

## 2023-11-11 NOTE — Consult Note (Addendum)
 WOC Nurse Consult Note: Reason for Consult: Requested to assess a wound on right elbow. Performed remotely after evaluated photos and notes. Wound type: Full thickness Pressure Injury POA: NA Measurement: aprox. 2.5 cm x 1.5 cm Wound bed: 80% yellow, signs of biofilm. 20% pale red. Drainage (amount, consistency, odor) Minimum amount, serous/purulent (see flowsheet). Periwound: intact, old scar, senile skin. Dressing procedure/placement/frequency: Cleanse with Vashe. Apply Medihoney daily on the wound bed. Cover with foam dressing changing PRN.  WOC team will not plan to follow further.  Please reconsult if further assistance is needed. Thank-you,  Rachel Budds BSN, RN, [employer], WOC  (Pager: [phone])

## 2023-11-11 NOTE — TOC Initial Note (Signed)
 Transition of Care [facility]) - Initial/Assessment Note    Patient Details  Name: Sherri [name] MRN: [id number] Date of Birth: 1940-12-11  [facility] CM/SW Contact:    Bari Leys, RN Phone Number: 11/11/2023, 10:40 AM  Clinical Narrative:  Met with patient and spouse at bedside to introduce role of TOC/NCM and review for dc planning, spouse answered assessment questions, pt resting in bed with eyes closed. Spouse states they reside in private residence, reports patient was recently admitted to a SNF for short term rehab, reports it was not a good experience and he prefers to take patient home with home health services. Spouse reports patient has had 24/7 care giver services for the past week and may be able to keep for a short period of time if needed. Home DME: RW, Rollator, BSC, adjustable bed. Ortho consult for possible surgery, will await PT eval post op. TOC will continue to follow.                   Expected Discharge Plan: Home w Home Health Services Barriers to Discharge: Continued Medical Work up   Patient Goals and CMS Choice Patient states their goals for this hospitalization and ongoing recovery are:: per pt's husband , prefers return home with home therapies          Expected Discharge Plan and Services       Living arrangements for the past 2 months: Single Family Home                                      Prior Living Arrangements/Services Living arrangements for the past 2 months: Single Family Home Lives with:: Spouse Patient language and need for interpreter reviewed:: Yes        Need for Family Participation in Patient Care: Yes (Comment) Care giver support system in place?: Yes (comment) Current home services: DME (RW, Rollator, BSC) Criminal Activity/Legal Involvement Pertinent to Current Situation/Hospitalization: No - Comment as needed  Activities of Daily Living   ADL Screening (condition at time of  admission) Independently performs ADLs?: Yes (appropriate for developmental age) Is the patient deaf or have difficulty hearing?: Yes Does the patient have difficulty seeing, even when wearing glasses/contacts?: No Does the patient have difficulty concentrating, remembering, or making decisions?: No  Permission Sought/Granted                  Emotional Assessment Appearance:: Appears stated age Attitude/Demeanor/Rapport: Gracious Affect (typically observed): Accepting Orientation: : Oriented to Self, Oriented to Place, Oriented to  Time, Oriented to Situation Alcohol  / Substance Use: Not Applicable Psych Involvement: No (comment)  Admission diagnosis:  Hip fracture (HCC) [S72.009A] Closed fracture of right hip, initial encounter [facility]) [S72.001A] Patient Active Problem List   Diagnosis Date Noted   Lightheadedness 11/10/2023   Physical deconditioning 11/10/2023   Parotid swelling 11/10/2023   Infected laceration of skin 11/10/2023   Skin tear of elbow without complication, right, initial encounter 11/10/2023   Hip fracture (HCC) 11/10/2023   Sepsis due to cellulitis (HCC) 10/03/2023   Cellulitis 10/03/2023   Pressure injury of skin 10/03/2023   Hyponatremia 10/02/2023   Fall at home, initial encounter 10/02/2023   Cellulitis of right lower extremity 10/02/2023   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   Cervical spondylosis with myelopathy and radiculopathy 04/23/2022   Constipation 03/04/2022   Upper back pain  on left side 03/04/2022   Lumbar vertebral fracture, pathologic 09/20/2021   Degenerative spondylolisthesis 08/19/2021   Acute left lumbar radiculopathy 07/05/2021   Senile purpura (HCC) 12/19/2020   Restless leg 02/02/2020   Bilateral leg edema 01/11/2020   Cervical radiculopathy at C8 06/28/2019   Carpal tunnel syndrome on both sides 06/28/2019   Right sided sciatica 04/14/2018   Trochanteric bursitis of right hip 04/14/2018   H/O  cervical spine surgery 11/10/2017   Dysphagia 10/26/2017   Gastroesophageal reflux disease 08/06/2017   Easy bruising 08/06/2017   Mid back pain 03/26/2017   Submandibular gland swelling 01/08/2017   High risk medication use 09/02/2016   Piriformis muscle pain 09/02/2016   Right hip pain 04/08/2016   Right knee pain 02/25/2016   Numbness and tingling in left hand 11/14/2014   Biceps tendon tear 10/10/2014   Insomnia 10/10/2014   Gait disturbance 06/13/2014   OP (osteoporosis) 01/18/2014   Other long term (current) drug therapy 01/18/2014   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 01/01/2013   History of colonic polyps 07/03/2010   Nocturia 05/28/2010   THYROID  NODULE 05/17/2008   Multiple sclerosis (HCC) 05/17/2008   Other fatigue 03/30/2007   Hyperlipidemia 08/20/2006   Essential hypertension 08/20/2006   Rheumatoid arthritis (HCC) 08/20/2006   Osteoarthritis 08/20/2006   CERVICAL CANCER, HX OF 08/20/2006   GASTRIC ULCER, HX OF 08/20/2006   PCP:  Colene Dauphin, MD Pharmacy:   [facility] [phone] Jonette Nestle, [place] - [address] [place] [address] Phone: [phone] Fax: [phone]     Social Drivers of Health (SDOH) Social History: SDOH Screenings   Food Insecurity: No Food Insecurity (11/10/2023)  Housing: Low Risk  (11/10/2023)  Transportation Needs: No Transportation Needs (11/10/2023)  Utilities: Not At Risk (11/10/2023)  Alcohol  Screen: Low Risk  (02/21/2022)  Depression (PHQ2-9): Low Risk  (07/27/2023)  Financial Resource Strain: Low Risk  (02/21/2022)  Physical Activity: Inactive (02/21/2022)  Social Connections: Socially Integrated (11/10/2023)  Recent Concern: Social Connections - Moderately Isolated (10/03/2023)  Stress: Stress Concern Present (02/21/2022)  Tobacco Use: Medium Risk (11/11/2023)   SDOH Interventions:     Readmission Risk Interventions     No data to display

## 2023-11-11 NOTE — Consult Note (Signed)
 Reason for Consult: Right hip pain Referring Physician: DP  Zada  A Stepanian is an 83 y.o. female.  HPI: This patient to have been admitted to the hospital in May for sepsis went home several chronic pain in her hip which her primary care physician as an outpatient showing a femoral neck fracture she was sent here to the emergency room for further evaluation.  Past Medical History:  Diagnosis Date   Anxiety    takes Valium  daily as needed   Basal cell carcinoma 01/21/1988   left nostril (MOHS), sup-right calf (CX35FU)   Basal cell carcinoma 03/22/1996   right calf (CX35FU)   Basal cell carcinoma 03/31/1995   left wing nose   Bruises easily    d/t meds   Cancer (HCC)    basal cell ca, in situ- uterine    Cataracts, bilateral    removed bilateral   Chronic back pain    Dizziness    r/t to meds   GERD (gastroesophageal reflux disease)    no meds on a regular basis but will take Tums if needed   Headache(784.0)    r/t neck issues   History of bronchitis 6-34yrs ago   History of colon polyps    benign   HOH (hard of hearing)    wears hearing aids   Hyperlipidemia    takes Atorvastatin  on Mondays and Fridays   Hypertension    Joint pain    Joint swelling    Multiple sclerosis (HCC)    Neuromuscular disorder (HCC)    Dr. Hobson Luna- [facility], follows [facility]   Nocturia    PONV (postoperative nausea and vomiting)    trouble urinating after surgery in 2014   Postoperative nausea and vomiting 01/11/2019   Rheumatoid arthritis (HCC)    Dr Voncille Guadalajara weekly, RA- hands- knees- feet    Rheumatoid arthritis(714.0)    Dr Alvira Josephs takes Xeljanz  daily   Right wrist fracture    Skin cancer    Squamous cell carcinoma of skin 11/02/2001   in situ-right knee (cx28fu)   Squamous cell carcinoma of skin 11/12/2011   in situ-left forearm (CX35FU), in situ-left foot (CX35FU)   Squamous cell carcinoma of skin 01/22/2012   in situ-left lower forearm (txpbx)   Squamous cell  carcinoma of skin 11/17/2012   right shin (txpbx)   Squamous cell carcinoma of skin 10/28/2013   in situ-right shoulder (CX35FU)   Squamous cell carcinoma of skin 04/28/2014   well diff-right shin (txpbx)   Squamous cell carcinoma of skin 11/02/2014   in situ-Left shin (txpbx)   Squamous cell carcinoma of skin 12/15/2014   in situ-Left hand (txpbx), bowens-left side chest (txpbx)   Squamous cell carcinoma of skin 05/09/2015   in situ-left hand (txpbx)    Squamous cell carcinoma of skin 05/07/2016   in situ-left inner shin,ant, in situ-left inner shin, post, in situ-left outer forearm, in situ-right knuckle   Squamous cell carcinoma of skin 03/1302018   in situ-left shoulder (txpbx), in situ-right inner shin (txpbx), in situ-top of left foot (txpbx), KA- left forearm (txpbx)   Squamous cell carcinoma of skin 12/02/2016   in situ-left inner shin (txpbx)   Squamous cell carcinoma of skin 03/04/2017   in situ-left outer sup, shin (txpbx), in situ- right 2nd knuckle finger (txpbx), in situ- Left wrist (txpbx)   Squamous cell carcinoma of skin 04/06/2018   in situ-above left knee inner (txpbx)   Squamous cell carcinoma of skin 04/21/2018   in situ-right  top hand (txpbx)   Squamous cell carcinoma of skin 07/08/2018   in situ-right lower inner shin (txpbx)   Squamous cell carcinoma of skin 04/27/2019   in situ- Left neck(CX35FU), In situ- right neck (CX35FU)   Urinary retention    sees Dr.Wrenn about 2 times a yr    Past Surgical History:  Procedure Laterality Date   ABDOMINAL HYSTERECTOMY     1985   ANTERIOR CERVICAL DECOMP/DISCECTOMY FUSION N/A 04/23/2022   Procedure: Anterior Cervical Decompression/Discectomy Fusion - Cervical Seven-Thoracic One;  Surgeon: Agustina Aldrich, MD;  Location: [facility] OR;  Service: Neurosurgery;  Laterality: N/A;  3C   APPENDECTOMY     with TAH   BACK SURGERY     several   BREAST BIOPSY Right    Results were negative   BROW LIFT Bilateral 10/02/2016    Procedure: BILATERAL LOWER LID BLEPHAROPLASTY;  Surgeon: Karan Osgood, MD;  Location: [facility] OR;  Service: Plastics;  Laterality: Bilateral;   CARPAL TUNNEL RELEASE Right    cataracts     CERVICAL FUSION     Dr Rica Chalet   CERVICAL FUSION  12/31/2012   Dr Rica Chalet   CERVICAL FUSION  04/2022   CHEST TUBE INSERTION     for traumatic Pneumothorax   COLONOSCOPY  07/16/2010   normal    COLONOSCOPY W/ POLYPECTOMY  1997   negative since; Dr Arvie Latus   ESOPHAGOGASTRODUODENOSCOPY  07/16/2010   normal   eye lid raise     EYE SURGERY Bilateral    cataracts removed - /w IOL   HARDWARE REMOVAL  08/2021   JOINT REPLACEMENT     LUMBAR FUSION     Dr Rica Chalet   NASAL SINUS SURGERY     POSTERIOR CERVICAL FUSION/FORAMINOTOMY N/A 12/31/2012   Procedure: POSTERIOR LATERAL CERVICAL FUSION/FORAMINOTOMY LEVEL 1 CERVICAL THREE-FOUR WITH LATERAL MASS SCREWS;  Surgeon: Adelbert Adler, MD;  Location: [facility];  Service: Neurosurgery;  Laterality: N/A;   PTOSIS REPAIR Bilateral 10/02/2016   Procedure: INTERNAL PTOSIS REPAIR;  Surgeon: Karan Osgood, MD;  Location: [facility] OR;  Service: Plastics;  Laterality: Bilateral;   SKIN BIOPSY     THYROID  SURGERY     R lobe removed- 1972, has grown back - CT last done- 2018   TOTAL HIP ARTHROPLASTY  12/22/2011   Procedure: TOTAL HIP ARTHROPLASTY;  Surgeon: Ilean Mall, MD;  Location: [facility] OR;  Service: Orthopedics;  Laterality: Left;   TOTAL SHOULDER ARTHROPLASTY     TUBAL LIGATION     UPPER GASTROINTESTINAL ENDOSCOPY  2012   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   WRIST SURGERY Left 09/16/2022   wrist/arm    Family History  Problem Relation Age of Onset   Cancer Mother        pancreatic   Diabetes Mother    Pancreatic cancer Mother    Heart disease Father        Rheumatic   Cancer Father        ? stomach   Stomach cancer Father    Cancer Sister        stomach   Stomach cancer Sister    Cancer Maternal Aunt        X 4; ? primary   Heart disease  Paternal Aunt    Cancer Maternal Grandmother        cervical   Colon cancer Other        Aunts   Multiple sclerosis Daughter    Hypertension Neg Hx  Stroke Neg Hx    Esophageal cancer Neg Hx    Rectal cancer Neg Hx     Social History:  reports that she quit smoking about 42 years ago. Her smoking use included cigarettes. She started smoking about 62 years ago. She has a 50 pack-year smoking history. She has never been exposed to tobacco smoke. She has never used smokeless tobacco. She reports current alcohol  use of about 14.0 standard drinks of alcohol  per week. She reports that she does not use drugs.  Allergies:  Allergies  Allergen Reactions   Darvon [Propoxyphene Hcl] Shortness Of Breath    ? Dose Related ? Lowered respirations greatly   Demerol [Meperidine] Shortness Of Breath and Other (See Comments)    Respiratory Distress   Penicillins Hives and Other (See Comments)    Has patient had a PCN reaction causing immediate rash, facial/tongue/throat swelling, SOB or lightheadedness with hypotension: No SEVERE RASH INVOLVING MUCUS MEMBRANES or SKIN NECROSIS: #  #  #  YES  #  #  #  Has patient had a PCN reaction that required hospitalization No Has patient had a PCN reaction occurring within the last 10 years: No.    Imuran [Azathioprine] Other (See Comments)    Weakness   Sulfa  Antibiotics Nausea Only and Other (See Comments)    States had severe indigestion and heartburn and nausea and had to stop taking   Arava [Leflunomide] Other (See Comments)    Excessive weight gain   Lipitor [Atorvastatin ] Nausea And Vomiting    Currently taking 2 times weekly - able to tolerate low dose    Vytorin [Ezetimibe-Simvastatin] Nausea And Vomiting    Medications: I have reviewed the patient's current medications.  Results for orders placed or performed during the hospital encounter of 11/10/23 (from the past 48 hours)  CBC     Status: Abnormal   Collection Time: 11/10/23  6:10 PM   Result Value Ref Range   WBC 7.3 4.0 - 10.5 K/uL   RBC 3.13 (L) 3.87 - 5.11 MIL/uL   Hemoglobin 11.0 (L) 12.0 - 15.0 g/dL   HCT 16.1 (L) 09.6 - 04.5 %   MCV 106.7 (H) 80.0 - 100.0 fL   MCH 35.1 (H) 26.0 - 34.0 pg   MCHC 32.9 30.0 - 36.0 g/dL   RDW 40.9 (H) 81.1 - 91.4 %   Platelets 315 150 - 400 K/uL   nRBC 0.0 0.0 - 0.2 %    Comment: Performed at [facility], [address] [place], [place] [address]  Basic metabolic panel     Status: Abnormal   Collection Time: 11/10/23  6:10 PM  Result Value Ref Range   Sodium 129 (L) 135 - 145 mmol/L   Potassium 3.5 3.5 - 5.1 mmol/L   Chloride 87 (L) 98 - 111 mmol/L   CO2 29 22 - 32 mmol/L   Glucose, Bld 170 (H) 70 - 99 mg/dL    Comment: Glucose reference range applies only to samples taken after fasting for at least 8 hours.   BUN 9 8 - 23 mg/dL   Creatinine, Ser 6.21 0.44 - 1.00 mg/dL   Calcium  8.6 (L) 8.9 - 10.3 mg/dL   GFR, Estimated >30 >86 mL/min    Comment: (NOTE) Calculated using the CKD-EPI Creatinine Equation (2021)    Anion gap 13 5 - 15    Comment: Performed at [facility], [address], [place], [place] [address]   *Note: Due to a large  number of results and/or encounters for the requested time period, some results have not been displayed. A complete set of results can be found in Results Review.    DG Pelvis 1-2 Views Result Date: 11/10/2023 CLINICAL DATA:  Right hip pain since fall 4 weeks ago. EXAM: PELVIS - 1-2 VIEW COMPARISON:  Hip series today FINDINGS: Postoperative changes from posterior fusion in the lower lumbar spine. Prior left hip replacement. No hardware complicating feature. Foreshortening of the right femoral neck compatible with impacted femoral neck fracture. No subluxation or dislocation. IMPRESSION: Impacted right femoral neck fracture. Electronically Signed   By: Janeece Mechanic M.D.   On: 11/10/2023 16:39   DG HIP UNILAT WITH PELVIS 2-3 VIEWS RIGHT Result Date:  11/10/2023 CLINICAL DATA:  Fall 4 weeks ago, right hip pain EXAM: DG HIP (WITH OR WITHOUT PELVIS) 2-3V RIGHT COMPARISON:  07/12/2021 FINDINGS: There is foreshortening of the femoral neck most compatible with impacted femoral neck fracture. No subluxation or dislocation. IMPRESSION: Impacted right femoral neck fracture. Electronically Signed   By: Janeece Mechanic M.D.   On: 11/10/2023 16:39    ROS Blood pressure (!) 148/73, pulse 92, temperature 98.3 F (36.8 C), temperature source Oral, resp. rate 14, height 5' 1 (1.549 m), weight 48.6 kg, SpO2 95%. Physical Exam Right lower remedy shortened axillary rotated pain with internal/external rotation of the hip neuro vastly intact without evidence of DVT  Radiographs demonstrate a right femoral neck fracture with displacement and impaction  Assessment/Plan: 1.  Acute femoral neck fracture displaced  Plan:  Patient will require arthroplasty of the right hip.  I we will contact my colleagues who perform hip replacements and recommend accordingly. Dr. Charol Copas to see In the interim analgesics as needed.  DVT prophylaxis preoperative clearance.  Arita Kyle PA   Susana Enter C Kenley Rettinger 11/11/2023, 8:14 AM

## 2023-11-11 NOTE — Progress Notes (Deleted)
 Office Visit Note  Patient: Sherri [name]             Date of Birth: 07-Jan-1941           MRN: [id number]             PCP: Colene Dauphin, MD Referring: Colene Dauphin, MD Visit Date: 11/25/2023 Occupation: @GUAROCC @  Subjective:  No chief complaint on file.   History of Present Illness: Sherri [name] is a 83 y.o. female ***     Activities of Daily Living:  Patient reports morning stiffness for *** {minute/hour:19697}.   Patient {ACTIONS;DENIES/REPORTS:21021675::Denies} nocturnal pain.  Difficulty dressing/grooming: {ACTIONS;DENIES/REPORTS:21021675::Denies} Difficulty climbing stairs: {ACTIONS;DENIES/REPORTS:21021675::Denies} Difficulty getting out of chair: {ACTIONS;DENIES/REPORTS:21021675::Denies} Difficulty using hands for taps, buttons, cutlery, and/or writing: {ACTIONS;DENIES/REPORTS:21021675::Denies}  No Rheumatology ROS completed.   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   Cervical spondylosis with myelopathy and radiculopathy 04/23/2022   Constipation 03/04/2022   Upper back pain on left side 03/04/2022   Lumbar vertebral fracture, pathologic 09/20/2021   Degenerative spondylolisthesis 08/19/2021   Acute left lumbar radiculopathy 07/05/2021   Senile purpura (HCC) 12/19/2020   Restless leg 02/02/2020   Bilateral leg edema 01/11/2020   Cervical radiculopathy at C8 06/28/2019   Carpal  tunnel syndrome on both sides 06/28/2019   Right sided sciatica 04/14/2018   Trochanteric bursitis of right hip 04/14/2018   H/O cervical spine surgery 11/10/2017   Dysphagia 10/26/2017   Gastroesophageal reflux disease 08/06/2017   Easy bruising 08/06/2017   Mid back pain 03/26/2017   Submandibular gland swelling 01/08/2017   High risk medication use 09/02/2016   Piriformis muscle pain 09/02/2016   Right hip pain 04/08/2016   Right knee pain 02/25/2016   Numbness and tingling in left hand 11/14/2014   Biceps tendon tear 10/10/2014   Insomnia 10/10/2014   Gait disturbance 06/13/2014   OP (osteoporosis) 01/18/2014   Other long term (current) drug therapy 01/18/2014   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 01/01/2013   History of colonic polyps 07/03/2010   Nocturia 05/28/2010   THYROID  NODULE 05/17/2008   Multiple sclerosis (HCC) 05/17/2008   Other fatigue 03/30/2007   Hyperlipidemia 08/20/2006   Essential hypertension 08/20/2006   Rheumatoid arthritis (HCC) 08/20/2006   Osteoarthritis 08/20/2006   CERVICAL CANCER, HX OF 08/20/2006   GASTRIC ULCER, HX OF 08/20/2006    Past Medical History:  Diagnosis Date   Anxiety    takes Valium  daily as needed   Basal cell carcinoma 01/21/1988   left nostril (MOHS), sup-right calf (CX35FU)   Basal cell carcinoma 03/22/1996   right calf (CX35FU)   Basal cell carcinoma 03/31/1995   left wing nose   Bruises easily    d/t meds   Cancer (HCC)    basal cell ca, in situ- uterine    Cataracts, bilateral    removed bilateral   Chronic back pain    Dizziness    r/t to meds  GERD (gastroesophageal reflux disease)    no meds on a regular basis but will take Tums if needed   Headache(784.0)    r/t neck issues   History of bronchitis 6-73yrs ago   History of colon polyps    benign   HOH (hard of hearing)    wears hearing aids   Hyperlipidemia    takes Atorvastatin  on Mondays and Fridays   Hypertension    Joint pain    Joint  swelling    Multiple sclerosis (HCC)    Neuromuscular disorder (HCC)    Dr. Hobson Luna- [facility], follows [facility]   Nocturia    PONV (postoperative nausea and vomiting)    trouble urinating after surgery in 2014   Postoperative nausea and vomiting 01/11/2019   Rheumatoid arthritis (HCC)    Dr Voncille Guadalajara weekly, RA- hands- knees- feet    Rheumatoid arthritis(714.0)    Dr Alvira Josephs takes Xeljanz  daily   Right wrist fracture    Skin cancer    Squamous cell carcinoma of skin 11/02/2001   in situ-right knee (cx32fu)   Squamous cell carcinoma of skin 11/12/2011   in situ-left forearm (CX35FU), in situ-left foot (CX35FU)   Squamous cell carcinoma of skin 01/22/2012   in situ-left lower forearm (txpbx)   Squamous cell carcinoma of skin 11/17/2012   right shin (txpbx)   Squamous cell carcinoma of skin 10/28/2013   in situ-right shoulder (CX35FU)   Squamous cell carcinoma of skin 04/28/2014   well diff-right shin (txpbx)   Squamous cell carcinoma of skin 11/02/2014   in situ-Left shin (txpbx)   Squamous cell carcinoma of skin 12/15/2014   in situ-Left hand (txpbx), bowens-left side chest (txpbx)   Squamous cell carcinoma of skin 05/09/2015   in situ-left hand (txpbx)    Squamous cell carcinoma of skin 05/07/2016   in situ-left inner shin,ant, in situ-left inner shin, post, in situ-left outer forearm, in situ-right knuckle   Squamous cell carcinoma of skin 03/1302018   in situ-left shoulder (txpbx), in situ-right inner shin (txpbx), in situ-top of left foot (txpbx), KA- left forearm (txpbx)   Squamous cell carcinoma of skin 12/02/2016   in situ-left inner shin (txpbx)   Squamous cell carcinoma of skin 03/04/2017   in situ-left outer sup, shin (txpbx), in situ- right 2nd knuckle finger (txpbx), in situ- Left wrist (txpbx)   Squamous cell carcinoma of skin 04/06/2018   in situ-above left knee inner (txpbx)   Squamous cell carcinoma of skin 04/21/2018   in situ-right top  hand (txpbx)   Squamous cell carcinoma of skin 07/08/2018   in situ-right lower inner shin (txpbx)   Squamous cell carcinoma of skin 04/27/2019   in situ- Left neck(CX35FU), In situ- right neck (CX35FU)   Urinary retention    sees Dr.Wrenn about 2 times a yr    Family History  Problem Relation Age of Onset   Cancer Mother        pancreatic   Diabetes Mother    Pancreatic cancer Mother    Heart disease Father        Rheumatic   Cancer Father        ? stomach   Stomach cancer Father    Cancer Sister        stomach   Stomach cancer Sister    Cancer Maternal Aunt        X 4; ? primary   Heart disease Paternal Aunt    Cancer Maternal Grandmother  cervical   Colon cancer Other        Aunts   Multiple sclerosis Daughter    Hypertension Neg Hx    Stroke Neg Hx    Esophageal cancer Neg Hx    Rectal cancer Neg Hx    Past Surgical History:  Procedure Laterality Date   ABDOMINAL HYSTERECTOMY     1985   ANTERIOR CERVICAL DECOMP/DISCECTOMY FUSION N/A 04/23/2022   Procedure: Anterior Cervical Decompression/Discectomy Fusion - Cervical Seven-Thoracic One;  Surgeon: Agustina Aldrich, MD;  Location: [facility] OR;  Service: Neurosurgery;  Laterality: N/A;  3C   APPENDECTOMY     with TAH   BACK SURGERY     several   BREAST BIOPSY Right    Results were negative   BROW LIFT Bilateral 10/02/2016   Procedure: BILATERAL LOWER LID BLEPHAROPLASTY;  Surgeon: Karan Osgood, MD;  Location: [facility] OR;  Service: Plastics;  Laterality: Bilateral;   CARPAL TUNNEL RELEASE Right    cataracts     CERVICAL FUSION     Dr Rica Chalet   CERVICAL FUSION  12/31/2012   Dr Rica Chalet   CERVICAL FUSION  04/2022   CHEST TUBE INSERTION     for traumatic Pneumothorax   COLONOSCOPY  07/16/2010   normal    COLONOSCOPY W/ POLYPECTOMY  1997   negative since; Dr Arvie Latus   ESOPHAGOGASTRODUODENOSCOPY  07/16/2010   normal   eye lid raise     EYE SURGERY Bilateral    cataracts removed - /w IOL   HARDWARE REMOVAL  08/2021    JOINT REPLACEMENT     LUMBAR FUSION     Dr Rica Chalet   NASAL SINUS SURGERY     POSTERIOR CERVICAL FUSION/FORAMINOTOMY N/A 12/31/2012   Procedure: POSTERIOR LATERAL CERVICAL FUSION/FORAMINOTOMY LEVEL 1 CERVICAL THREE-FOUR WITH LATERAL MASS SCREWS;  Surgeon: Adelbert Adler, MD;  Location: [facility];  Service: Neurosurgery;  Laterality: N/A;   PTOSIS REPAIR Bilateral 10/02/2016   Procedure: INTERNAL PTOSIS REPAIR;  Surgeon: Karan Osgood, MD;  Location: [facility] OR;  Service: Plastics;  Laterality: Bilateral;   SKIN BIOPSY     THYROID  SURGERY     R lobe removed- 1972, has grown back - CT last done- 2018   TOTAL HIP ARTHROPLASTY  12/22/2011   Procedure: TOTAL HIP ARTHROPLASTY;  Surgeon: Ilean Mall, MD;  Location: [facility] OR;  Service: Orthopedics;  Laterality: Left;   TOTAL SHOULDER ARTHROPLASTY     TUBAL LIGATION     UPPER GASTROINTESTINAL ENDOSCOPY  2012   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   WRIST SURGERY Left 09/16/2022   wrist/arm   Social History   Social History Narrative   Not on file   Immunization History  Administered Date(s) Administered   Fluad Quad(high Dose 65+) 02/02/2020, 02/01/2022   Influenza Whole 02/22/2007, 02/08/2013   Influenza, High Dose Seasonal PF 02/04/2015, 02/18/2017, 02/21/2018, 01/16/2019, 01/25/2021, 02/02/2023   Influenza-Unspecified 01/24/2014, 01/25/2016, 02/18/2017, 01/16/2019, 01/25/2020   Moderna Covid-19 Fall Seasonal Vaccine 19yrs & older 02/02/2023, 09/07/2023   Moderna Covid-19 Vaccine Bivalent Booster 72yrs & up 02/11/2022   PFIZER(Purple Top)SARS-COV-2 Vaccination 06/18/2019, 07/09/2019, 01/09/2020, 07/03/2020, 02/07/2021   PNEUMOCOCCAL CONJUGATE-20 02/26/2022   Pfizer Covid-19 Vaccine Bivalent Booster 24yrs & up 02/08/2021   Pneumococcal Conjugate-13 11/23/2012, 09/08/2014   Pneumococcal Polysaccharide-23 05/26/2006   Pneumococcal-Unspecified 12/28/2012, 06/09/2020   Respiratory Syncytial Virus Vaccine,Recomb Aduvanted(Arexvy)  01/23/2022   Td 05/28/2010   Tdap 12/24/2020, 01/07/2021   Unspecified SARS-COV-2 Vaccination 06/26/2020, 02/11/2022   Varicella 11/07/2020  Zoster Recombinant(Shingrix) 02/10/2019, 04/14/2019   Zoster, Live 05/26/2009, 07/10/2020     Objective: Vital Signs: There were no vitals taken for this visit.   Physical Exam   Musculoskeletal Exam: ***  CDAI Exam: CDAI Score: -- Patient Global: --; Provider Global: -- Swollen: --; Tender: -- Joint Exam 11/25/2023   No joint exam has been documented for this visit   There is currently no information documented on the homunculus. Go to the Rheumatology activity and complete the homunculus joint exam.  Investigation: No additional findings.  Imaging: DG Pelvis 1-2 Views Result Date: 11/10/2023 CLINICAL DATA:  Right hip pain since fall 4 weeks ago. EXAM: PELVIS - 1-2 VIEW COMPARISON:  Hip series today FINDINGS: Postoperative changes from posterior fusion in the lower lumbar spine. Prior left hip replacement. No hardware complicating feature. Foreshortening of the right femoral neck compatible with impacted femoral neck fracture. No subluxation or dislocation. IMPRESSION: Impacted right femoral neck fracture. Electronically Signed   By: Janeece Mechanic M.D.   On: 11/10/2023 16:39   DG HIP UNILAT WITH PELVIS 2-3 VIEWS RIGHT Result Date: 11/10/2023 CLINICAL DATA:  Fall 4 weeks ago, right hip pain EXAM: DG HIP (WITH OR WITHOUT PELVIS) 2-3V RIGHT COMPARISON:  07/12/2021 FINDINGS: There is foreshortening of the femoral neck most compatible with impacted femoral neck fracture. No subluxation or dislocation. IMPRESSION: Impacted right femoral neck fracture. Electronically Signed   By: Janeece Mechanic M.D.   On: 11/10/2023 16:39    Recent Labs: Lab Results  Component Value Date   WBC 7.3 11/10/2023   HGB 11.0 (L) 11/10/2023   PLT 315 11/10/2023   NA 129 (L) 11/10/2023   K 3.5 11/10/2023   CL 87 (L) 11/10/2023   CO2 29 11/10/2023   GLUCOSE 170 (H)  11/10/2023   BUN 9 11/10/2023   CREATININE 0.79 11/10/2023   BILITOT 1.0 10/09/2023   ALKPHOS 37 (L) 10/09/2023   AST 45 (H) 10/09/2023   ALT 41 10/09/2023   PROT 6.3 (L) 10/09/2023   ALBUMIN 2.6 (L) 10/09/2023   CALCIUM  8.6 (L) 11/10/2023   GFRAA 65 09/26/2020   QFTBGOLDPLUS NEGATIVE 11/03/2022    Speciality Comments: Prior therapy includes: Xeljanz  (cost), Olumiant -inadequate response, Imuran weakness, leflunomide-intolerance Prolia : 05/04/18, 11/12/18, 06/09/19, 12/07/19, 07/05/20, 01/01/21, 07/09/21, 01/09/22, 07/24/22  Procedures:  No procedures performed Allergies: Darvon [propoxyphene hcl], Demerol [meperidine], Penicillins, Imuran [azathioprine], Sulfa  antibiotics, Arava [leflunomide], Lipitor [atorvastatin ], and Vytorin [ezetimibe-simvastatin]   Assessment / Plan:     Visit Diagnoses: Rheumatoid arthritis involving multiple sites with positive rheumatoid factor (HCC)  High risk medication use  Long term (current) use of systemic steroids  Pain in both hands  Primary osteoarthritis of right hip  History of total hip replacement, left  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  Degeneration of intervertebral disc of lumbar region without discogenic back pain or lower extremity pain  Closed fracture of left wrist with routine healing, subsequent encounter  Ganglion cyst of volar aspect of right wrist  Closed fracture of right wrist with routine healing, subsequent encounter  Age-related osteoporosis without current pathological fracture  Vitamin D  deficiency  History of hyperlipidemia  History of hypertension  Restless leg  Gastroesophageal reflux disease without esophagitis  History of multiple sclerosis (HCC)  History of depression  History of anxiety  Primary insomnia  Other fatigue  History of cellulitis  Orders: No orders of the defined types were placed in this encounter.  No orders of the defined types were placed in this  encounter.  Face-to-face time spent with patient was *** minutes. Greater than 50% of time was spent in counseling and coordination of care.  Follow-Up Instructions: No follow-ups on file.   Romayne Clubs, PA-C  Note - This record has been created using Dragon software.  Chart creation errors have been sought, but may not always  have been located. Such creation errors do not reflect on  the standard of medical care.

## 2023-11-11 NOTE — H&P (View-Only) (Signed)
 Reason for Consult: Right hip pain Referring Physician: DP  Zada  A Stepanian is an 83 y.o. female.  HPI: This patient to have been admitted to the hospital in May for sepsis went home several chronic pain in her hip which her primary care physician as an outpatient showing a femoral neck fracture she was sent here to the emergency room for further evaluation.  Past Medical History:  Diagnosis Date   Anxiety    takes Valium  daily as needed   Basal cell carcinoma 01/21/1988   left nostril (MOHS), sup-right calf (CX35FU)   Basal cell carcinoma 03/22/1996   right calf (CX35FU)   Basal cell carcinoma 03/31/1995   left wing nose   Bruises easily    d/t meds   Cancer (HCC)    basal cell ca, in situ- uterine    Cataracts, bilateral    removed bilateral   Chronic back pain    Dizziness    r/t to meds   GERD (gastroesophageal reflux disease)    no meds on a regular basis but will take Tums if needed   Headache(784.0)    r/t neck issues   History of bronchitis 6-34yrs ago   History of colon polyps    benign   HOH (hard of hearing)    wears hearing aids   Hyperlipidemia    takes Atorvastatin  on Mondays and Fridays   Hypertension    Joint pain    Joint swelling    Multiple sclerosis (HCC)    Neuromuscular disorder (HCC)    Dr. Hobson Luna- [facility], follows [facility]   Nocturia    PONV (postoperative nausea and vomiting)    trouble urinating after surgery in 2014   Postoperative nausea and vomiting 01/11/2019   Rheumatoid arthritis (HCC)    Dr Voncille Guadalajara weekly, RA- hands- knees- feet    Rheumatoid arthritis(714.0)    Dr Alvira Josephs takes Xeljanz  daily   Right wrist fracture    Skin cancer    Squamous cell carcinoma of skin 11/02/2001   in situ-right knee (cx28fu)   Squamous cell carcinoma of skin 11/12/2011   in situ-left forearm (CX35FU), in situ-left foot (CX35FU)   Squamous cell carcinoma of skin 01/22/2012   in situ-left lower forearm (txpbx)   Squamous cell  carcinoma of skin 11/17/2012   right shin (txpbx)   Squamous cell carcinoma of skin 10/28/2013   in situ-right shoulder (CX35FU)   Squamous cell carcinoma of skin 04/28/2014   well diff-right shin (txpbx)   Squamous cell carcinoma of skin 11/02/2014   in situ-Left shin (txpbx)   Squamous cell carcinoma of skin 12/15/2014   in situ-Left hand (txpbx), bowens-left side chest (txpbx)   Squamous cell carcinoma of skin 05/09/2015   in situ-left hand (txpbx)    Squamous cell carcinoma of skin 05/07/2016   in situ-left inner shin,ant, in situ-left inner shin, post, in situ-left outer forearm, in situ-right knuckle   Squamous cell carcinoma of skin 03/1302018   in situ-left shoulder (txpbx), in situ-right inner shin (txpbx), in situ-top of left foot (txpbx), KA- left forearm (txpbx)   Squamous cell carcinoma of skin 12/02/2016   in situ-left inner shin (txpbx)   Squamous cell carcinoma of skin 03/04/2017   in situ-left outer sup, shin (txpbx), in situ- right 2nd knuckle finger (txpbx), in situ- Left wrist (txpbx)   Squamous cell carcinoma of skin 04/06/2018   in situ-above left knee inner (txpbx)   Squamous cell carcinoma of skin 04/21/2018   in situ-right  top hand (txpbx)   Squamous cell carcinoma of skin 07/08/2018   in situ-right lower inner shin (txpbx)   Squamous cell carcinoma of skin 04/27/2019   in situ- Left neck(CX35FU), In situ- right neck (CX35FU)   Urinary retention    sees Dr.Wrenn about 2 times a yr    Past Surgical History:  Procedure Laterality Date   ABDOMINAL HYSTERECTOMY     1985   ANTERIOR CERVICAL DECOMP/DISCECTOMY FUSION N/A 04/23/2022   Procedure: Anterior Cervical Decompression/Discectomy Fusion - Cervical Seven-Thoracic One;  Surgeon: Agustina Aldrich, MD;  Location: [facility] OR;  Service: Neurosurgery;  Laterality: N/A;  3C   APPENDECTOMY     with TAH   BACK SURGERY     several   BREAST BIOPSY Right    Results were negative   BROW LIFT Bilateral 10/02/2016    Procedure: BILATERAL LOWER LID BLEPHAROPLASTY;  Surgeon: Karan Osgood, MD;  Location: [facility] OR;  Service: Plastics;  Laterality: Bilateral;   CARPAL TUNNEL RELEASE Right    cataracts     CERVICAL FUSION     Dr Rica Chalet   CERVICAL FUSION  12/31/2012   Dr Rica Chalet   CERVICAL FUSION  04/2022   CHEST TUBE INSERTION     for traumatic Pneumothorax   COLONOSCOPY  07/16/2010   normal    COLONOSCOPY W/ POLYPECTOMY  1997   negative since; Dr Arvie Latus   ESOPHAGOGASTRODUODENOSCOPY  07/16/2010   normal   eye lid raise     EYE SURGERY Bilateral    cataracts removed - /w IOL   HARDWARE REMOVAL  08/2021   JOINT REPLACEMENT     LUMBAR FUSION     Dr Rica Chalet   NASAL SINUS SURGERY     POSTERIOR CERVICAL FUSION/FORAMINOTOMY N/A 12/31/2012   Procedure: POSTERIOR LATERAL CERVICAL FUSION/FORAMINOTOMY LEVEL 1 CERVICAL THREE-FOUR WITH LATERAL MASS SCREWS;  Surgeon: Adelbert Adler, MD;  Location: [facility];  Service: Neurosurgery;  Laterality: N/A;   PTOSIS REPAIR Bilateral 10/02/2016   Procedure: INTERNAL PTOSIS REPAIR;  Surgeon: Karan Osgood, MD;  Location: [facility] OR;  Service: Plastics;  Laterality: Bilateral;   SKIN BIOPSY     THYROID  SURGERY     R lobe removed- 1972, has grown back - CT last done- 2018   TOTAL HIP ARTHROPLASTY  12/22/2011   Procedure: TOTAL HIP ARTHROPLASTY;  Surgeon: Ilean Mall, MD;  Location: [facility] OR;  Service: Orthopedics;  Laterality: Left;   TOTAL SHOULDER ARTHROPLASTY     TUBAL LIGATION     UPPER GASTROINTESTINAL ENDOSCOPY  2012   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   WRIST SURGERY Left 09/16/2022   wrist/arm    Family History  Problem Relation Age of Onset   Cancer Mother        pancreatic   Diabetes Mother    Pancreatic cancer Mother    Heart disease Father        Rheumatic   Cancer Father        ? stomach   Stomach cancer Father    Cancer Sister        stomach   Stomach cancer Sister    Cancer Maternal Aunt        X 4; ? primary   Heart disease  Paternal Aunt    Cancer Maternal Grandmother        cervical   Colon cancer Other        Aunts   Multiple sclerosis Daughter    Hypertension Neg Hx  Stroke Neg Hx    Esophageal cancer Neg Hx    Rectal cancer Neg Hx     Social History:  reports that she quit smoking about 42 years ago. Her smoking use included cigarettes. She started smoking about 62 years ago. She has a 50 pack-year smoking history. She has never been exposed to tobacco smoke. She has never used smokeless tobacco. She reports current alcohol  use of about 14.0 standard drinks of alcohol  per week. She reports that she does not use drugs.  Allergies:  Allergies  Allergen Reactions   Darvon [Propoxyphene Hcl] Shortness Of Breath    ? Dose Related ? Lowered respirations greatly   Demerol [Meperidine] Shortness Of Breath and Other (See Comments)    Respiratory Distress   Penicillins Hives and Other (See Comments)    Has patient had a PCN reaction causing immediate rash, facial/tongue/throat swelling, SOB or lightheadedness with hypotension: No SEVERE RASH INVOLVING MUCUS MEMBRANES or SKIN NECROSIS: #  #  #  YES  #  #  #  Has patient had a PCN reaction that required hospitalization No Has patient had a PCN reaction occurring within the last 10 years: No.    Imuran [Azathioprine] Other (See Comments)    Weakness   Sulfa  Antibiotics Nausea Only and Other (See Comments)    States had severe indigestion and heartburn and nausea and had to stop taking   Arava [Leflunomide] Other (See Comments)    Excessive weight gain   Lipitor [Atorvastatin ] Nausea And Vomiting    Currently taking 2 times weekly - able to tolerate low dose    Vytorin [Ezetimibe-Simvastatin] Nausea And Vomiting    Medications: I have reviewed the patient's current medications.  Results for orders placed or performed during the hospital encounter of 11/10/23 (from the past 48 hours)  CBC     Status: Abnormal   Collection Time: 11/10/23  6:10 PM   Result Value Ref Range   WBC 7.3 4.0 - 10.5 K/uL   RBC 3.13 (L) 3.87 - 5.11 MIL/uL   Hemoglobin 11.0 (L) 12.0 - 15.0 g/dL   HCT 16.1 (L) 09.6 - 04.5 %   MCV 106.7 (H) 80.0 - 100.0 fL   MCH 35.1 (H) 26.0 - 34.0 pg   MCHC 32.9 30.0 - 36.0 g/dL   RDW 40.9 (H) 81.1 - 91.4 %   Platelets 315 150 - 400 K/uL   nRBC 0.0 0.0 - 0.2 %    Comment: Performed at [facility], [address] [place], [place] [address]  Basic metabolic panel     Status: Abnormal   Collection Time: 11/10/23  6:10 PM  Result Value Ref Range   Sodium 129 (L) 135 - 145 mmol/L   Potassium 3.5 3.5 - 5.1 mmol/L   Chloride 87 (L) 98 - 111 mmol/L   CO2 29 22 - 32 mmol/L   Glucose, Bld 170 (H) 70 - 99 mg/dL    Comment: Glucose reference range applies only to samples taken after fasting for at least 8 hours.   BUN 9 8 - 23 mg/dL   Creatinine, Ser 6.21 0.44 - 1.00 mg/dL   Calcium  8.6 (L) 8.9 - 10.3 mg/dL   GFR, Estimated >30 >86 mL/min    Comment: (NOTE) Calculated using the CKD-EPI Creatinine Equation (2021)    Anion gap 13 5 - 15    Comment: Performed at [facility], [address], [place], [place] [address]   *Note: Due to a large  number of results and/or encounters for the requested time period, some results have not been displayed. A complete set of results can be found in Results Review.    DG Pelvis 1-2 Views Result Date: 11/10/2023 CLINICAL DATA:  Right hip pain since fall 4 weeks ago. EXAM: PELVIS - 1-2 VIEW COMPARISON:  Hip series today FINDINGS: Postoperative changes from posterior fusion in the lower lumbar spine. Prior left hip replacement. No hardware complicating feature. Foreshortening of the right femoral neck compatible with impacted femoral neck fracture. No subluxation or dislocation. IMPRESSION: Impacted right femoral neck fracture. Electronically Signed   By: Janeece Mechanic M.D.   On: 11/10/2023 16:39   DG HIP UNILAT WITH PELVIS 2-3 VIEWS RIGHT Result Date:  11/10/2023 CLINICAL DATA:  Fall 4 weeks ago, right hip pain EXAM: DG HIP (WITH OR WITHOUT PELVIS) 2-3V RIGHT COMPARISON:  07/12/2021 FINDINGS: There is foreshortening of the femoral neck most compatible with impacted femoral neck fracture. No subluxation or dislocation. IMPRESSION: Impacted right femoral neck fracture. Electronically Signed   By: Janeece Mechanic M.D.   On: 11/10/2023 16:39    ROS Blood pressure (!) 148/73, pulse 92, temperature 98.3 F (36.8 C), temperature source Oral, resp. rate 14, height 5' 1 (1.549 m), weight 48.6 kg, SpO2 95%. Physical Exam Right lower remedy shortened axillary rotated pain with internal/external rotation of the hip neuro vastly intact without evidence of DVT  Radiographs demonstrate a right femoral neck fracture with displacement and impaction  Assessment/Plan: 1.  Acute femoral neck fracture displaced  Plan:  Patient will require arthroplasty of the right hip.  I we will contact my colleagues who perform hip replacements and recommend accordingly. Dr. Charol Copas to see In the interim analgesics as needed.  DVT prophylaxis preoperative clearance.  Arita Kyle PA   Susana Enter C Kenley Rettinger 11/11/2023, 8:14 AM

## 2023-11-11 NOTE — Plan of Care (Signed)

## 2023-11-12 ENCOUNTER — Other Ambulatory Visit: Payer: Self-pay

## 2023-11-12 ENCOUNTER — Inpatient Hospital Stay (HOSPITAL_COMMUNITY): Admitting: Anesthesiology

## 2023-11-12 ENCOUNTER — Encounter (HOSPITAL_COMMUNITY)
Admission: EM | Disposition: A | Discharge status: Home Health | Payer: Self-pay | Source: Home / Self Care | Attending: Family Medicine

## 2023-11-12 ENCOUNTER — Inpatient Hospital Stay (HOSPITAL_COMMUNITY)

## 2023-11-12 ENCOUNTER — Encounter (HOSPITAL_COMMUNITY): Payer: Self-pay | Admitting: Family Medicine

## 2023-11-12 ENCOUNTER — Ambulatory Visit: Admitting: Internal Medicine

## 2023-11-12 DIAGNOSIS — I739 Peripheral vascular disease, unspecified: Secondary | ICD-10-CM | POA: Diagnosis not present

## 2023-11-12 DIAGNOSIS — S72009A Fracture of unspecified part of neck of unspecified femur, initial encounter for closed fracture: Secondary | ICD-10-CM | POA: Diagnosis not present

## 2023-11-12 DIAGNOSIS — I10 Essential (primary) hypertension: Secondary | ICD-10-CM

## 2023-11-12 DIAGNOSIS — S72001A Fracture of unspecified part of neck of right femur, initial encounter for closed fracture: Secondary | ICD-10-CM

## 2023-11-12 DIAGNOSIS — Z87891 Personal history of nicotine dependence: Secondary | ICD-10-CM

## 2023-11-12 HISTORY — PX: TOTAL HIP ARTHROPLASTY: SHX124

## 2023-11-12 LAB — CBC
HCT: 31.8 % — ABNORMAL LOW (ref 36.0–46.0)
Hemoglobin: 10.3 g/dL — ABNORMAL LOW (ref 12.0–15.0)
MCH: 35.3 pg — ABNORMAL HIGH (ref 26.0–34.0)
MCHC: 32.4 g/dL (ref 30.0–36.0)
MCV: 108.9 fL — ABNORMAL HIGH (ref 80.0–100.0)
Platelets: 263 10*3/uL (ref 150–400)
RBC: 2.92 MIL/uL — ABNORMAL LOW (ref 3.87–5.11)
RDW: 16 % — ABNORMAL HIGH (ref 11.5–15.5)
WBC: 9.4 10*3/uL (ref 4.0–10.5)
nRBC: 0 % (ref 0.0–0.2)

## 2023-11-12 LAB — BASIC METABOLIC PANEL WITH GFR
Anion gap: 9 (ref 5–15)
BUN: 10 mg/dL (ref 8–23)
CO2: 28 mmol/L (ref 22–32)
Calcium: 7.8 mg/dL — ABNORMAL LOW (ref 8.9–10.3)
Chloride: 89 mmol/L — ABNORMAL LOW (ref 98–111)
Creatinine, Ser: 0.6 mg/dL (ref 0.44–1.00)
GFR, Estimated: >60 mL/min (ref >60)
Glucose, Bld: 97 mg/dL (ref 70–99)
Potassium: 3.2 mmol/L — ABNORMAL LOW (ref 3.5–5.1)
Sodium: 126 mmol/L — ABNORMAL LOW (ref 135–145)

## 2023-11-12 LAB — SURGICAL PCR SCREEN
MRSA, PCR: POSITIVE — AB
Staphylococcus aureus: POSITIVE — AB

## 2023-11-12 SURGERY — ARTHROPLASTY, HIP, TOTAL, ANTERIOR APPROACH
Anesthesia: General | Site: Hip | Laterality: Right

## 2023-11-12 MED ORDER — ALBUMIN HUMAN 5 % IV SOLN
12.5000 g | Freq: Once | INTRAVENOUS | Status: AC
  Administered 2023-11-12: 12.5 g via INTRAVENOUS

## 2023-11-12 MED ORDER — LIDOCAINE 2% (20 MG/ML) 5 ML SYRINGE
INTRAMUSCULAR | Status: DC | PRN
  Administered 2023-11-12: 60 mg via INTRAVENOUS

## 2023-11-12 MED ORDER — SODIUM CHLORIDE 0.9 % IV SOLN
INTRAVENOUS | Status: AC
  Filled 2023-11-12: qty 20

## 2023-11-12 MED ORDER — METHOCARBAMOL 1000 MG/10ML IJ SOLN: 500.0000 mg | Freq: Four times a day (QID) | INTRAMUSCULAR | Status: DC | PRN

## 2023-11-12 MED ORDER — ONDANSETRON HCL 4 MG/2ML IJ SOLN: 4.0000 mg | Freq: Once | INTRAMUSCULAR | Status: DC | PRN

## 2023-11-12 MED ORDER — ORAL CARE MOUTH RINSE: 15.0000 mL | Freq: Once | OROMUCOSAL | Status: AC

## 2023-11-12 MED ORDER — MUPIROCIN 2 % EX OINT
1.0000 | TOPICAL_OINTMENT | Freq: Two times a day (BID) | CUTANEOUS | 0 refills | Status: DC
Start: 2023-11-12 — End: 2023-12-23

## 2023-11-12 MED ORDER — SODIUM CHLORIDE (PF) 0.9 % IJ SOLN
INTRAMUSCULAR | Status: DC | PRN
  Administered 2023-11-12: 61 mL

## 2023-11-12 MED ORDER — FENTANYL CITRATE (PF) 100 MCG/2ML IJ SOLN
INTRAMUSCULAR | Status: AC
Start: 2023-11-12 — End: 2023-11-12
  Filled 2023-11-12: qty 2

## 2023-11-12 MED ORDER — PHENYLEPHRINE HCL-NACL 20-0.9 MG/250ML-% IV SOLN
INTRAVENOUS | Status: DC | PRN
Start: 2023-11-12 — End: 2023-11-12
  Administered 2023-11-12: 25 ug/min via INTRAVENOUS

## 2023-11-12 MED ORDER — VANCOMYCIN HCL IN DEXTROSE 1-5 GM/200ML-% IV SOLN
1000.0000 mg | INTRAVENOUS | Status: AC
  Administered 2023-11-12: 1000 mg via INTRAVENOUS
  Filled 2023-11-12: qty 200

## 2023-11-12 MED ORDER — PHENYLEPHRINE HCL (PRESSORS) 10 MG/ML IV SOLN
INTRAVENOUS | Status: AC
Start: 2023-11-12 — End: 2023-11-12
  Filled 2023-11-12: qty 1

## 2023-11-12 MED ORDER — PROPOFOL 10 MG/ML IV BOLUS
INTRAVENOUS | Status: DC | PRN
  Administered 2023-11-12: 100 mg via INTRAVENOUS
  Administered 2023-11-12: 50 mg via INTRAVENOUS

## 2023-11-12 MED ORDER — CHLORHEXIDINE GLUCONATE CLOTH 2 % EX PADS
6.0000 | MEDICATED_PAD | Freq: Every day | CUTANEOUS | Status: DC
  Administered 2023-11-12 – 2023-11-15 (×4): 6 via TOPICAL

## 2023-11-12 MED ORDER — PHENYLEPHRINE HCL (PRESSORS) 10 MG/ML IV SOLN
INTRAVENOUS | Status: AC
  Filled 2023-11-12: qty 1

## 2023-11-12 MED ORDER — POTASSIUM CHLORIDE 20 MEQ PO PACK
40.0000 meq | PACK | Freq: Once | ORAL | Status: AC
  Administered 2023-11-12: 40 meq via ORAL
  Filled 2023-11-12: qty 2

## 2023-11-12 MED ORDER — ACETAMINOPHEN 10 MG/ML IV SOLN
1000.0000 mg | Freq: Once | INTRAVENOUS | Status: AC
  Administered 2023-11-12: 1000 mg via INTRAVENOUS
  Filled 2023-11-12: qty 100

## 2023-11-12 MED ORDER — WATER FOR IRRIGATION, STERILE IR SOLN
Status: DC | PRN
  Administered 2023-11-12: 2000 mL

## 2023-11-12 MED ORDER — METHOCARBAMOL 500 MG PO TABS
500.0000 mg | ORAL_TABLET | Freq: Four times a day (QID) | ORAL | Status: DC | PRN
  Administered 2023-11-14 – 2023-11-19 (×8): 500 mg via ORAL
  Filled 2023-11-12 (×8): qty 1

## 2023-11-12 MED ORDER — SUGAMMADEX SODIUM 200 MG/2ML IV SOLN
INTRAVENOUS | Status: DC | PRN
  Administered 2023-11-12: 200 mg via INTRAVENOUS

## 2023-11-12 MED ORDER — TRANEXAMIC ACID-NACL 1000-0.7 MG/100ML-% IV SOLN
1000.0000 mg | INTRAVENOUS | Status: AC
  Administered 2023-11-12: 1000 mg via INTRAVENOUS
  Filled 2023-11-12: qty 100

## 2023-11-12 MED ORDER — POLYETHYLENE GLYCOL 3350 17 G PO PACK: 17.0000 g | PACK | Freq: Every day | ORAL | Status: DC | PRN

## 2023-11-12 MED ORDER — ONDANSETRON HCL 4 MG/2ML IJ SOLN
INTRAMUSCULAR | Status: DC | PRN
  Administered 2023-11-12: 4 mg via INTRAVENOUS

## 2023-11-12 MED ORDER — ASPIRIN 81 MG PO CHEW
81.0000 mg | CHEWABLE_TABLET | Freq: Two times a day (BID) | ORAL | Status: DC
  Administered 2023-11-12 – 2023-11-20 (×16): 81 mg via ORAL
  Filled 2023-11-12 (×16): qty 1

## 2023-11-12 MED ORDER — ACETAMINOPHEN 500 MG PO TABS
1000.0000 mg | ORAL_TABLET | Freq: Once | ORAL | Status: DC
  Filled 2023-11-12: qty 2

## 2023-11-12 MED ORDER — FENTANYL CITRATE PF 50 MCG/ML IJ SOSY
PREFILLED_SYRINGE | INTRAMUSCULAR | Status: AC
  Filled 2023-11-12: qty 2

## 2023-11-12 MED ORDER — CEFAZOLIN SODIUM-DEXTROSE 2-4 GM/100ML-% IV SOLN
2.0000 g | Freq: Four times a day (QID) | INTRAVENOUS | Status: AC
  Administered 2023-11-12 – 2023-11-13 (×2): 2 g via INTRAVENOUS
  Filled 2023-11-12 (×2): qty 100

## 2023-11-12 MED ORDER — MUPIROCIN 2 % EX OINT
1.0000 | TOPICAL_OINTMENT | Freq: Two times a day (BID) | CUTANEOUS | Status: AC
  Administered 2023-11-12 – 2023-11-16 (×10): 1 via NASAL
  Filled 2023-11-12: qty 22

## 2023-11-12 MED ORDER — FENTANYL CITRATE (PF) 100 MCG/2ML IJ SOLN
INTRAMUSCULAR | Status: DC | PRN
  Administered 2023-11-12 (×2): 50 ug via INTRAVENOUS

## 2023-11-12 MED ORDER — 0.9 % SODIUM CHLORIDE (POUR BTL) OPTIME
TOPICAL | Status: DC | PRN
  Administered 2023-11-12: 1000 mL

## 2023-11-12 MED ORDER — MENTHOL 3 MG MT LOZG: 1.0000 | LOZENGE | OROMUCOSAL | Status: DC | PRN

## 2023-11-12 MED ORDER — HYDROCODONE-ACETAMINOPHEN 5-325 MG PO TABS: 1.0000 | ORAL_TABLET | ORAL | Status: DC | PRN

## 2023-11-12 MED ORDER — VANCOMYCIN HCL IN DEXTROSE 1-5 GM/200ML-% IV SOLN
1000.0000 mg | Freq: Once | INTRAVENOUS | Status: AC
  Administered 2023-11-12: 1000 mg via INTRAVENOUS
  Filled 2023-11-12: qty 200

## 2023-11-12 MED ORDER — PROPOFOL 1000 MG/100ML IV EMUL
INTRAVENOUS | Status: AC
  Filled 2023-11-12: qty 100

## 2023-11-12 MED ORDER — DIPHENHYDRAMINE HCL 12.5 MG/5ML PO ELIX
12.5000 mg | ORAL_SOLUTION | ORAL | Status: DC | PRN
  Administered 2023-11-15 – 2023-11-16 (×2): 25 mg via ORAL
  Filled 2023-11-12 (×2): qty 10

## 2023-11-12 MED ORDER — CHLORHEXIDINE GLUCONATE 4 % EX SOLN: 60.0000 mL | Freq: Once | CUTANEOUS | Status: DC

## 2023-11-12 MED ORDER — MORPHINE SULFATE (PF) 2 MG/ML IV SOLN: 0.5000 mg | INTRAVENOUS | Status: DC | PRN

## 2023-11-12 MED ORDER — LACTATED RINGERS IV SOLN: INTRAVENOUS | Status: DC

## 2023-11-12 MED ORDER — POVIDONE-IODINE 10 % EX SWAB: 2.0000 | Freq: Once | CUTANEOUS | Status: DC

## 2023-11-12 MED ORDER — ALBUMIN HUMAN 5 % IV SOLN
INTRAVENOUS | Status: DC | PRN
Start: 2023-11-12 — End: 2023-11-12

## 2023-11-12 MED ORDER — ROCURONIUM BROMIDE 10 MG/ML (PF) SYRINGE
PREFILLED_SYRINGE | INTRAVENOUS | Status: AC
Start: 2023-11-12 — End: 2023-11-12
  Filled 2023-11-12: qty 10

## 2023-11-12 MED ORDER — FENTANYL CITRATE PF 50 MCG/ML IJ SOSY
25.0000 ug | PREFILLED_SYRINGE | INTRAMUSCULAR | Status: DC | PRN
  Administered 2023-11-12: 50 ug via INTRAVENOUS

## 2023-11-12 MED ORDER — PHENOL 1.4 % MT LIQD: 1.0000 | OROMUCOSAL | Status: DC | PRN

## 2023-11-12 MED ORDER — METOCLOPRAMIDE HCL 5 MG/ML IJ SOLN: 5.0000 mg | Freq: Three times a day (TID) | INTRAMUSCULAR | Status: DC | PRN

## 2023-11-12 MED ORDER — DEXAMETHASONE SODIUM PHOSPHATE 10 MG/ML IJ SOLN
INTRAMUSCULAR | Status: DC | PRN
  Administered 2023-11-12: 8 mg via INTRAVENOUS

## 2023-11-12 MED ORDER — DEXAMETHASONE SODIUM PHOSPHATE 10 MG/ML IJ SOLN
INTRAMUSCULAR | Status: AC
Start: 2023-11-12 — End: 2023-11-12
  Filled 2023-11-12: qty 1

## 2023-11-12 MED ORDER — SODIUM CHLORIDE 0.9 % IR SOLN
Status: DC | PRN
  Administered 2023-11-12: 3000 mL

## 2023-11-12 MED ORDER — ROCURONIUM BROMIDE 10 MG/ML (PF) SYRINGE
PREFILLED_SYRINGE | INTRAVENOUS | Status: DC | PRN
  Administered 2023-11-12: 10 mg via INTRAVENOUS
  Administered 2023-11-12: 50 mg via INTRAVENOUS

## 2023-11-12 MED ORDER — LACTATED RINGERS IV SOLN: INTRAVENOUS | Status: DC | PRN

## 2023-11-12 MED ORDER — CHLORHEXIDINE GLUCONATE 0.12 % MT SOLN
15.0000 mL | Freq: Once | OROMUCOSAL | Status: AC
  Administered 2023-11-12: 15 mL via OROMUCOSAL

## 2023-11-12 MED ORDER — ONDANSETRON HCL 4 MG/2ML IJ SOLN
INTRAMUSCULAR | Status: AC
  Filled 2023-11-12: qty 2

## 2023-11-12 MED ORDER — HYDROCODONE-ACETAMINOPHEN 7.5-325 MG PO TABS
1.0000 | ORAL_TABLET | ORAL | Status: DC | PRN
  Administered 2023-11-13 – 2023-11-14 (×4): 1 via ORAL
  Administered 2023-11-15: 2 via ORAL
  Administered 2023-11-15: 1 via ORAL
  Administered 2023-11-15: 2 via ORAL
  Administered 2023-11-16 – 2023-11-18 (×6): 1 via ORAL
  Administered 2023-11-18: 2 via ORAL
  Administered 2023-11-18: 1 via ORAL
  Administered 2023-11-20 (×2): 2 via ORAL
  Filled 2023-11-12 (×4): qty 1
  Filled 2023-11-12: qty 2
  Filled 2023-11-12 (×6): qty 1
  Filled 2023-11-12 (×4): qty 2
  Filled 2023-11-12 (×2): qty 1

## 2023-11-12 MED ORDER — PHENYLEPHRINE HCL (PRESSORS) 10 MG/ML IV SOLN
INTRAVENOUS | Status: DC | PRN
  Administered 2023-11-12 (×2): 80 ug via INTRAVENOUS
  Administered 2023-11-12 (×2): 160 ug via INTRAVENOUS

## 2023-11-12 MED ORDER — ISOPROPYL ALCOHOL 70 % SOLN
Status: DC | PRN
  Administered 2023-11-12: 1 via TOPICAL

## 2023-11-12 MED ORDER — BISACODYL 10 MG RE SUPP: 10.0000 mg | Freq: Every day | RECTAL | Status: DC | PRN

## 2023-11-12 MED ORDER — SODIUM CHLORIDE 0.9 % IV SOLN: INTRAVENOUS | Status: DC

## 2023-11-12 MED ORDER — ACETAMINOPHEN 10 MG/ML IV SOLN
INTRAVENOUS | Status: AC
  Filled 2023-11-12: qty 100

## 2023-11-12 MED ORDER — SENNA 8.6 MG PO TABS
1.0000 | ORAL_TABLET | Freq: Two times a day (BID) | ORAL | Status: DC
  Administered 2023-11-12 – 2023-11-19 (×15): 8.6 mg via ORAL
  Filled 2023-11-12 (×16): qty 1

## 2023-11-12 MED ORDER — PHENYLEPHRINE 80 MCG/ML (10ML) SYRINGE FOR IV PUSH (FOR BLOOD PRESSURE SUPPORT)
PREFILLED_SYRINGE | INTRAVENOUS | Status: AC
Start: 2023-11-12 — End: 2023-11-12
  Filled 2023-11-12: qty 10

## 2023-11-12 MED ORDER — LIDOCAINE HCL (PF) 2 % IJ SOLN
INTRAMUSCULAR | Status: AC
  Filled 2023-11-12: qty 5

## 2023-11-12 MED ORDER — CHLORHEXIDINE GLUCONATE 4 % EX SOLN
1.0000 | CUTANEOUS | 1 refills | Status: DC
Start: 2023-11-12 — End: 2023-12-08

## 2023-11-12 MED ORDER — METOCLOPRAMIDE HCL 5 MG PO TABS: 5.0000 mg | ORAL_TABLET | Freq: Three times a day (TID) | ORAL | Status: DC | PRN

## 2023-11-12 SURGICAL SUPPLY — 50 items
BAG COUNTER SPONGE SURGICOUNT (BAG) IMPLANT
BAG ZIPLOCK 12X15 (MISCELLANEOUS) IMPLANT
BIT DRILL RINGLOC 3.2MMX20 (BIT) IMPLANT
BLADE SAW RECIPROCATING 77.5 (BLADE) ×2 IMPLANT
CHLORAPREP W/TINT 26 (MISCELLANEOUS) ×2 IMPLANT
COVER PERINEAL POST (MISCELLANEOUS) ×2 IMPLANT
COVER SURGICAL LIGHT HANDLE (MISCELLANEOUS) ×2 IMPLANT
DERMABOND ADVANCED .7 DNX12 (GAUZE/BANDAGES/DRESSINGS) ×4 IMPLANT
DRAPE IMP U-DRAPE 54X76 (DRAPES) ×2 IMPLANT
DRAPE SHEET LG 3/4 BI-LAMINATE (DRAPES) ×6 IMPLANT
DRAPE STERI IOBAN 125X83 (DRAPES) ×2 IMPLANT
DRAPE U-SHAPE 47X51 STRL (DRAPES) ×2 IMPLANT
DRSG AQUACEL AG ADV 3.5X10 (GAUZE/BANDAGES/DRESSINGS) ×2 IMPLANT
ELECT REM PT RETURN 15FT ADLT (MISCELLANEOUS) ×2 IMPLANT
GAUZE SPONGE 4X4 12PLY STRL (GAUZE/BANDAGES/DRESSINGS) ×2 IMPLANT
GLOVE BIO SURGEON STRL SZ7 (GLOVE) ×2 IMPLANT
GLOVE BIO SURGEON STRL SZ8.5 (GLOVE) ×4 IMPLANT
GLOVE BIOGEL PI IND STRL 7.5 (GLOVE) ×2 IMPLANT
GLOVE BIOGEL PI IND STRL 8.5 (GLOVE) ×2 IMPLANT
GOWN SPEC L3 XXLG W/TWL (GOWN DISPOSABLE) ×2 IMPLANT
GOWN STRL REUS W/ TWL XL LVL3 (GOWN DISPOSABLE) ×2 IMPLANT
HEAD FEM -6XOFST 36XMDLR (Orthopedic Implant) IMPLANT
HOLDER FOLEY CATH W/STRAP (MISCELLANEOUS) ×2 IMPLANT
HOOD PEEL AWAY T7 (MISCELLANEOUS) ×6 IMPLANT
KIT TURNOVER KIT A (KITS) ×4 IMPLANT
LINER ACE G7 HIGH 36 SZ E (Liner) IMPLANT
MANIFOLD NEPTUNE II (INSTRUMENTS) ×2 IMPLANT
MARKER SKIN DUAL TIP RULER LAB (MISCELLANEOUS) ×2 IMPLANT
NDL SAFETY ECLIPSE 18X1.5 (NEEDLE) ×2 IMPLANT
NDL SPNL 18GX3.5 QUINCKE PK (NEEDLE) ×2 IMPLANT
NEEDLE SPNL 18GX3.5 QUINCKE PK (NEEDLE) ×1 IMPLANT
PACK ANTERIOR HIP CUSTOM (KITS) ×2 IMPLANT
PENCIL SMOKE EVACUATOR (MISCELLANEOUS) ×2 IMPLANT
SCREW BN 30X6.5XST STRL ACTB (Screw) IMPLANT
SEALER BIPOLAR AQUA 6.0 (INSTRUMENTS) ×2 IMPLANT
SET HNDPC FAN SPRY TIP SCT (DISPOSABLE) ×2 IMPLANT
SHELL ACETAB 3H 52 E HIP (Shell) IMPLANT
SOLUTION PRONTOSAN WOUND 350ML (IRRIGATION / IRRIGATOR) ×2 IMPLANT
SPIKE FLUID TRANSFER (MISCELLANEOUS) ×2 IMPLANT
STAPLER SKIN PROX 35W (STAPLE) IMPLANT
STEM FEM ARC HIP HO 18X175 (Stem) IMPLANT
SUT MNCRL AB 3-0 PS2 18 (SUTURE) ×2 IMPLANT
SUT MON AB 2-0 CT1 36 (SUTURE) ×2 IMPLANT
SUT STRATAFIX 14 PDO 48 VLT (SUTURE) ×2 IMPLANT
SUT VIC AB 2-0 CT1 TAPERPNT 27 (SUTURE) IMPLANT
SYR 3ML LL SCALE MARK (SYRINGE) ×2 IMPLANT
TOWEL GREEN STERILE FF (TOWEL DISPOSABLE) ×2 IMPLANT
TRAY FOLEY MTR SLVR 16FR STAT (SET/KITS/TRAYS/PACK) IMPLANT
TUBE SUCTION HIGH CAP CLEAR NV (SUCTIONS) ×2 IMPLANT
WATER STERILE IRR 1000ML POUR (IV SOLUTION) ×2 IMPLANT

## 2023-11-12 NOTE — Anesthesia Postprocedure Evaluation (Signed)
 Anesthesia Post Note  Patient: Sherri [name]  Procedure(s) Performed: ARTHROPLASTY, HIP, TOTAL, ANTERIOR APPROACH (Right: Hip)     Patient location during evaluation: PACU Anesthesia Type: General Level of consciousness: awake and alert, oriented and patient cooperative Pain management: pain level controlled Vital Signs Assessment: post-procedure vital signs reviewed and stable Respiratory status: spontaneous breathing, nonlabored ventilation and respiratory function stable Cardiovascular status: blood pressure returned to baseline and stable Postop Assessment: no apparent nausea or vomiting Anesthetic complications: no   No notable events documented.  Last Vitals:  Vitals:   11/12/23 1815 11/12/23 1821  BP: (!) 100/55 91/61  Pulse: 90 86  Resp: 10 14  Temp: 36.6 C   SpO2: 95% 100%    Last Pain:  Vitals:   11/12/23 1815  TempSrc:   PainSc: 0-No pain                 Jacquelyne Matte

## 2023-11-12 NOTE — Interval H&P Note (Signed)
 History and Physical Interval Note:  11/12/2023 2:35 PM  Sherri  [name]  has presented today for surgery, with the diagnosis of RIGHT FEMORAL NECK FRACTURE.  The various methods of treatment have been discussed with the patient and family. After consideration of risks, benefits and other options for treatment, the patient has consented to  Procedure(s): ARTHROPLASTY, HIP, TOTAL, ANTERIOR APPROACH (Right) as a surgical intervention.  The patient's history has been reviewed, patient examined, no change in status, stable for surgery.  I have reviewed the patient's chart and labs.  Questions were answered to the patient's satisfaction.    The risks, benefits, and alternatives were discussed with the patient. There are risks associated with the surgery including, but not limited to, problems with anesthesia (death), infection, instability (giving out of the joint), dislocation, differences in leg length/angulation/rotation, fracture of bones, loosening or failure of implants, hematoma (blood accumulation) which may require surgical drainage, blood clots, pulmonary embolism, nerve injury (foot drop and lateral thigh numbness), and blood vessel injury. The patient understands these risks and elects to proceed.   Margart Shears Mykai Wendorf

## 2023-11-12 NOTE — Op Note (Signed)
 OPERATIVE REPORT  SURGEON: Adonica Hoose, MD   ASSISTANT: Trixie Furnace, PA-C  PREOPERATIVE DIAGNOSIS: Subacute displaced Right femoral neck fracture.   POSTOPERATIVE DIAGNOSIS: Subacute displaced Right femoral neck fracture.   PROCEDURE: Right total hip arthroplasty, anterior approach.   IMPLANTS: Biomet Arcos One Piece broach body stem, size 18 x 175 mm, standard offset. Biomet G7 OsseoTi Cup, size 52 mm. Biomet Vivacit-E liner, size 36 mm, E, neutral. Biomet metal head ball, size 36 - 6 mm.  ANESTHESIA:  General  ANTIBIOTICS: 1g vancomycin .  ESTIMATED BLOOD LOSS:-600 mL    DRAINS: None.  COMPLICATIONS: None   CONDITION: PACU - hemodynamically stable.   BRIEF CLINICAL NOTE: Sherri [name] is a 83 y.o. female with a subacute, displaced Right femoral neck fracture. The patient was admitted to the hospitalist service and underwent perioperative risk stratification and medical optimization. The risks, benefits, and alternatives to total hip arthroplasty were explained, and the patient elected to proceed.  PROCEDURE IN DETAIL: The patient was taken to the operating room and general anesthesia was induced on the hospital bed.  The patient was then positioned on the Hana table.  All bony prominences were well padded.  The hip was prepped and draped in the normal sterile surgical fashion.  A time-out was called verifying side and site of surgery. Antibiotics were given within 60 minutes of beginning the procedure.   Bikini incision was made, and the direct anterior approach to the hip was performed through the Hueter interval.  Lateral femoral circumflex vessels were treated with the Auqumantys. The anterior capsule was exposed and an inverted T capsulotomy was made.  There was no fracture hematoma; instead there was serosanguinous joint fluid. The patient was found to have a comminuted Right subcapital femoral neck fracture with extension distally to the lesser trochanter, and proximally  involving intertrochanteric line and tip of the greater trochanter.  The fracture was heavily scarred.  I freshened the femoral neck cut with a saw.  I removed the femoral neck fragment.  A corkscrew was placed into the head and the head was removed.  This was passed to the back table and was measured. The pubofemoral ligament was released subperiosteally to the lesser trochanter.  Acetabular exposure was achieved, and the pulvinar and labrum were excised. Sequential reaming of the acetabulum was then performed up to a size 51 mm reamer under direct visulization. A 52 mm cup was then opened and impacted into place at approximately 40 degrees of abduction and 20 degrees of anteversion. I elected to augment the initial press fit fixation with a single 6.5 mm screw.  The final polyethylene liner was impacted into place and acetabular osteophytes were removed.    I then gained femoral exposure taking care to protect the abductors and greater trochanter.  This was performed using standard external rotation, extension, and adduction.  A cookie cutter was used to enter the femoral canal, and then the femoral canal finder was placed.  Sequential reaming was performed up to a size 17.5, and sequential broaching was performed up to a size 18.  Calcar planer was used on the femoral neck remnant.  I placed a standard offset neck and a trial head ball.  The hip was reduced.  Leg lengths and offset were checked fluoroscopically.  The hip was dislocated and trial components were removed.  The final implants were placed, and the hip was reduced.  Fluoroscopy was used to confirm component position and leg lengths.  At 90 degrees of external  rotation and full extension, the hip was stable to an anterior directed force.   The wound was copiously irrigated with Irrisept solution and normal saline using pulse lavage.  Marcaine  solution was injected into the periarticular soft tissue.  The wound was closed in layers using #1  Stratafix for the fascia, 2-0 Vicryl for the subcutaneous fat, 2-0 Monocryl for the deep dermal layer, and staples + Dermabond for the skin.  Once the glue was fully dried, an Aquacell Ag dressing was applied.  The patient was transported to the recovery room in stable condition.  Sponge, needle, and instrument counts were correct at the end of the case x2.  The patient tolerated the procedure well and there were no known complications.  Please note that a surgical assistant was a medical necessity for this procedure to perform it in a safe and expeditious manner. Assistant was necessary to provide appropriate retraction of vital neurovascular structures, to prevent femoral fracture, and to allow for anatomic placement of the prosthesis.

## 2023-11-12 NOTE — Discharge Instructions (Signed)
 ? ?Dr. Arlys John Swinteck ?Joint Replacement Specialist ?[facility] ?[address] ?[place], [place] [address] ?[phone] ? ? ?TOTAL HIP REPLACEMENT POSTOPERATIVE DIRECTIONS ? ? ? ?Hip Rehabilitation, Guidelines Following Surgery  ? ?WEIGHT BEARING ?Weight bearing as tolerated with assist device (walker, cane, etc) as directed, use it as long as suggested by your surgeon or therapist, typically at least 4-6 weeks. ? ?The results of a hip operation are greatly improved after range of motion and muscle strengthening exercises. Follow all safety measures which are given to protect your hip. If any of these exercises cause increased pain or swelling in your joint, decrease the amount until you are comfortable again. Then slowly increase the exercises. Call your caregiver if you have problems or questions.  ? ?HOME CARE INSTRUCTIONS  ?Most of the following instructions are designed to prevent the dislocation of your new hip.  ?Remove items at home which could result in a fall. This includes throw rugs or furniture in walking pathways.  ?Continue medications as instructed at time of discharge. ?You may have some home medications which will be placed on hold until you complete the course of blood thinner medication. ?You may start showering once you are discharged home. Do not remove your dressing. ?Do not put on socks or shoes without following the instructions of your caregivers.   ?Sit on chairs with arms. Use the chair arms to help push yourself up when arising.  ?Arrange for the use of a toilet seat elevator so you are not sitting low.  ?Walk with walker as instructed.  ?You may resume a sexual relationship in one month or when given the OK by your caregiver.  ?Use walker as long as suggested by your caregivers.  ?You may put full weight on your legs and walk as much as is comfortable. ?Avoid periods of inactivity such as sitting longer than an hour when not asleep. This helps prevent blood  clots.  ?You may return to work once you are cleared by Designer, industrial/product.  ?Do not drive a car for 6 weeks or until released by your surgeon.  ?Do not drive while taking narcotics.  ?Wear elastic stockings for two weeks following surgery during the day but you may remove then at night.  ?Make sure you keep all of your appointments after your operation with all of your doctors and caregivers. You should call the office at the above phone number and make an appointment for approximately two weeks after the date of your surgery. ?Please pick up a stool softener and laxative for home use as long as you are requiring pain medications. ?ICE to the affected hip every three hours for 30 minutes at a time and then as needed for pain and swelling. Continue to use ice on the hip for pain and swelling from surgery. You may notice swelling that will progress down to the foot and ankle.  This is normal after surgery.  Elevate the leg when you are not up walking on it.   ?It is important for you to complete the blood thinner medication as prescribed by your doctor. ?Continue to use the breathing machine which will help keep your temperature down.  It is common for your temperature to cycle up and down following surgery, especially at night when you are not up moving around and exerting yourself.  The breathing machine keeps your lungs expanded and your temperature down. ? ?RANGE OF MOTION AND STRENGTHENING EXERCISES  ?These exercises are designed to help you  keep full movement of your hip joint. Follow your caregiver's or physical therapist's instructions. Perform all exercises about fifteen times, three times per day or as directed. Exercise both hips, even if you have had only one joint replacement. These exercises can be done on a training (exercise) mat, on the floor, on a table or on a bed. Use whatever works the best and is most comfortable for you. Use music or television while you are exercising so that the exercises are a  pleasant break in your day. This will make your life better with the exercises acting as a break in routine you can look forward to.  ?Lying on your back, slowly slide your foot toward your buttocks, raising your knee up off the floor. Then slowly slide your foot back down until your leg is straight again.  ?Lying on your back spread your legs as far apart as you can without causing discomfort.  ?Lying on your side, raise your upper leg and foot straight up from the floor as far as is comfortable. Slowly lower the leg and repeat.  ?Lying on your back, tighten up the muscle in the front of your thigh (quadriceps muscles). You can do this by keeping your leg straight and trying to raise your heel off the floor. This helps strengthen the largest muscle supporting your knee.  ?Lying on your back, tighten up the muscles of your buttocks both with the legs straight and with the knee bent at a comfortable angle while keeping your heel on the floor.  ? ?SKILLED REHAB INSTRUCTIONS: ?If the patient is transferred to a skilled rehab facility following release from the hospital, a list of the current medications will be sent to the facility for the patient to continue.  When discharged from the skilled rehab facility, please have the facility set up the patient's Home Health Physical Therapy prior to being released. Also, the skilled facility will be responsible for providing the patient with their medications at time of release from the facility to include their pain medication and their blood thinner medication. If the patient is still at the rehab facility at time of the two week follow up appointment, the skilled rehab facility will also need to assist the patient in arranging follow up appointment in our office and any transportation needs. ? ?POST-OPERATIVE OPIOID TAPER INSTRUCTIONS: ?It is important to wean off of your opioid medication as soon as possible. If you do not need pain medication after your surgery it is ok  to stop day one. ?Opioids include: ?Codeine, Hydrocodone(Norco, Vicodin), Oxycodone(Percocet, oxycontin) and hydromorphone amongst others.  ?Long term and even short term use of opiods can cause: ?Increased pain response ?Dependence ?Constipation ?Depression ?Respiratory depression ?And more.  ?Withdrawal symptoms can include ?Flu like symptoms ?Nausea, vomiting ?And more ?Techniques to manage these symptoms ?Hydrate well ?Eat regular healthy meals ?Stay active ?Use relaxation techniques(deep breathing, meditating, yoga) ?Do Not substitute Alcohol to help with tapering ?If you have been on opioids for less than two weeks and do not have pain than it is ok to stop all together.  ?Plan to wean off of opioids ?This plan should start within one week post op of your joint replacement. ?Maintain the same interval or time between taking each dose and first decrease the dose.  ?Cut the total daily intake of opioids by one tablet each day ?Next start to increase the time between doses. ?The last dose that should be eliminated is the evening dose.  ? ? ?MAKE  SURE YOU:  ?Understand these instructions.  ?Will watch your condition.  ?Will get help right away if you are not doing well or get worse. ? ?Pick up stool softner and laxative for home use following surgery while on pain medications. ?Do not remove your dressing. ?The dressing is waterproof--it is OK to take showers. ?Continue to use ice for pain and swelling after surgery. ?Do not use any lotions or creams on the incision until instructed by your surgeon. ?Total Hip Protocol. ? ?

## 2023-11-12 NOTE — Transfer of Care (Signed)
 Immediate Anesthesia Transfer of Care Note  Patient: Sherri [name]  Procedure(s) Performed: ARTHROPLASTY, HIP, TOTAL, ANTERIOR APPROACH (Right: Hip)  Patient Location: PACU  Anesthesia Type:General  Level of Consciousness: awake and alert   Airway & Oxygen Therapy: Patient Spontanous Breathing and Patient connected to face mask oxygen  Post-op Assessment: Report given to RN and Post -op Vital signs reviewed and stable  Post vital signs: Reviewed and stable  Last Vitals:  Vitals Value Taken Time  BP 100/55 11/12/23 18:15  Temp    Pulse 85 11/12/23 18:17  Resp 18 11/12/23 18:17  SpO2 96 % 11/12/23 18:17  Vitals shown include unfiled device data.  Last Pain:  Vitals:   11/12/23 1257  TempSrc:   PainSc: 6       Patients Stated Pain Goal: 4 (11/12/23 1257)  Complications: No notable events documented.

## 2023-11-12 NOTE — Plan of Care (Signed)

## 2023-11-12 NOTE — Anesthesia Procedure Notes (Signed)
 Procedure Name: Intubation Date/Time: 11/12/2023 3:23 PM  Performed by: Darlena Ego, CRNAPre-anesthesia Checklist: Patient identified, Emergency Drugs available, Suction available and Patient being monitored Patient Re-evaluated:Patient Re-evaluated prior to induction Oxygen Delivery Method: Circle System Utilized Preoxygenation: Pre-oxygenation with 100% oxygen Induction Type: IV induction Ventilation: Mask ventilation without difficulty Laryngoscope Size: Miller and 2 Grade View: Grade I Tube type: Oral Tube size: 7.0 mm Number of attempts: 1 Airway Equipment and Method: Stylet and Oral airway Placement Confirmation: ETT inserted through vocal cords under direct vision, positive ETCO2 and breath sounds checked- equal and bilateral Secured at: 20 cm Tube secured with: Tape Dental Injury: Teeth and Oropharynx as per pre-operative assessment

## 2023-11-12 NOTE — Anesthesia Preprocedure Evaluation (Addendum)
 Anesthesia Evaluation  Patient identified by MRN, date of birth, ID band Patient awake    Reviewed: Allergy & Precautions, NPO status , Patient's Chart, lab work & pertinent test results, reviewed documented beta blocker date and time   History of Anesthesia Complications (+) PONV and history of anesthetic complications  Airway Mallampati: I  TM Distance: >3 FB Neck ROM: Limited    Dental  (+) Teeth Intact, Dental Advisory Given, Caps   Pulmonary former smoker   Pulmonary exam normal breath sounds clear to auscultation       Cardiovascular hypertension, Pt. on home beta blockers + Peripheral Vascular Disease  Normal cardiovascular exam Rhythm:Regular Rate:Normal     Neuro/Psych  Headaches PSYCHIATRIC DISORDERS Anxiety     Multiple sclerosis S/P cervical fusion C4-T1  Neuromuscular disease    GI/Hepatic Neg liver ROS,GERD  Medicated,,  Endo/Other  negative endocrine ROS    Renal/GU negative Renal ROS Bladder dysfunction      Musculoskeletal  (+) Arthritis , Rheumatoid disorders,  RIGHT FEMORAL NECK FRACTURE   Abdominal   Peds  Hematology  (+) Blood dyscrasia, anemia   Anesthesia Other Findings Day of surgery medications reviewed with the patient.  Reproductive/Obstetrics                             Anesthesia Physical Anesthesia Plan  ASA: 3  Anesthesia Plan: General   Post-op Pain Management: Ofirmev  IV (intra-op)*   Induction: Intravenous  PONV Risk Score and Plan: 4 or greater and Dexamethasone  and Ondansetron   Airway Management Planned: Oral ETT and Video Laryngoscope Planned  Additional Equipment:   Intra-op Plan:   Post-operative Plan: Extubation in OR  Informed Consent: I have reviewed the patients History and Physical, chart, labs and discussed the procedure including the risks, benefits and alternatives for the proposed anesthesia with the patient or authorized  representative who has indicated his/her understanding and acceptance.     Dental advisory given  Plan Discussed with: CRNA  Anesthesia Plan Comments:         Anesthesia Quick Evaluation

## 2023-11-12 NOTE — Progress Notes (Signed)
 [facility]  PROGRESS NOTE  Sherri  [name] VHQ:[id number] DOB: [birth year] DOA: 11/10/2023 PCP: Sherri Dauphin, MD   Brief HPI:   83 y.o. female with medical history significant of anxiety, GERD, rheumatoid arthritis, hypertension, multiple sclerosis, lipidemia who presented to the emergency department due to hip pain.  Patient was admitted in May.  During this hospitalization she was treated for cellulitis and sepsis.  She had a mechanical fall has CT scan which showed no evidence of fracture.  She been having chronic pain of her left hip and difficulty walking.  She went to her primary care doctor who ordered outpatient imaging which showed impacted hip fracture.  She was referred to the emergency department for further assessment.     Assessment/Plan:    Right impacted hip fracture - Patient had recent fall in May with no evidence of fracture however had persistent hip pain now with right femoral neck fracture - Orthopedics consulted -Plan for arthroplasty per orthopedics     # Hypertension -continue carvedilol    # Chronic pain -continue Cymbalta  As needed fentanyl    # Urinary retention -continue oxybutynin   # GERD -continue Protonix    # Rheumatoid arthritis -continue prednisone    # Hyperlipidemia -continue statin   # Hypokalemia - Potassium is 3.2 - Replace potassium with KCl 40 mEq x 1 - Follow BMP in am   Medications     atorvastatin   10 mg Oral Daily   carvedilol   3.125 mg Oral BID WC   docusate sodium   100 mg Oral Daily   DULoxetine   40 mg Oral QHS   feeding supplement  237 mL Oral BID BM   furosemide   20 mg Oral Daily   leptospermum manuka honey  1 Application Topical Daily   oxybutynin  5 mg Oral Daily   pantoprazole   40 mg Oral BID AC   predniSONE   5 mg Oral Q breakfast     Data Reviewed:   CBG:  No results for input(s): GLUCAP in the last 168 hours.  SpO2: 100 %    Vitals:   11/11/23 1548 11/11/23 1654 11/11/23 2012 11/12/23  0540  BP: 108/70 120/67 100/60 117/70  Pulse: 89 84 86 84  Resp: 16  16 14   Temp: 97.8 F (36.6 C)  98.1 F (36.7 C) 97.9 F (36.6 C)  TempSrc: Oral  Oral Oral  SpO2: 100%  97% 100%  Weight:      Height:          Data Reviewed:  Basic Metabolic Panel: Recent Labs  Lab 11/10/23 1810 11/12/23 0354  NA 129* 126*  K 3.5 3.2*  CL 87* 89*  CO2 29 28  GLUCOSE 170* 97  BUN 9 10  CREATININE 0.79 0.60  CALCIUM  8.6* 7.8*    CBC: Recent Labs  Lab 11/10/23 1810 11/12/23 0354  WBC 7.3 9.4  HGB 11.0* 10.3*  HCT 33.4* 31.8*  MCV 106.7* 108.9*  PLT 315 263    LFT No results for input(s): AST, ALT, ALKPHOS, BILITOT, PROT, ALBUMIN in the last 168 hours.   Antibiotics: Anti-infectives (From admission, onward)    None        DVT prophylaxis: SCDs  Code Status: Full code  Family Communication: No family at bedside   CONSULTS orthopedics   Subjective    Denies any complaints  Objective    Physical Examination:  Appears in no acute distress S1-S2, regular, no murmur auscultated Abdomen is soft, nontender Extremities no edema  Status is: Inpatient:  Sherri [name]   [facility] If 7PM-7AM, please contact night-coverage at [email or web address], Office  [phone]   11/12/2023, 7:54 AM  LOS: 1 day

## 2023-11-13 ENCOUNTER — Encounter (HOSPITAL_COMMUNITY): Payer: Self-pay | Admitting: Orthopedic Surgery

## 2023-11-13 ENCOUNTER — Ambulatory Visit

## 2023-11-13 DIAGNOSIS — I959 Hypotension, unspecified: Secondary | ICD-10-CM

## 2023-11-13 DIAGNOSIS — S72009A Fracture of unspecified part of neck of unspecified femur, initial encounter for closed fracture: Secondary | ICD-10-CM | POA: Diagnosis not present

## 2023-11-13 DIAGNOSIS — S72001A Fracture of unspecified part of neck of right femur, initial encounter for closed fracture: Secondary | ICD-10-CM | POA: Diagnosis not present

## 2023-11-13 LAB — BASIC METABOLIC PANEL WITH GFR
Anion gap: 8 (ref 5–15)
BUN: 7 mg/dL — ABNORMAL LOW (ref 8–23)
CO2: 28 mmol/L (ref 22–32)
Calcium: 7.6 mg/dL — ABNORMAL LOW (ref 8.9–10.3)
Chloride: 95 mmol/L — ABNORMAL LOW (ref 98–111)
Creatinine, Ser: 0.56 mg/dL (ref 0.44–1.00)
GFR, Estimated: >60 mL/min (ref >60)
Glucose, Bld: 160 mg/dL — ABNORMAL HIGH (ref 70–99)
Potassium: 4.5 mmol/L (ref 3.5–5.1)
Sodium: 131 mmol/L — ABNORMAL LOW (ref 135–145)

## 2023-11-13 MED ORDER — HYDROCODONE-ACETAMINOPHEN 5-325 MG PO TABS
1.0000 | ORAL_TABLET | ORAL | Status: DC | PRN
  Administered 2023-11-15 – 2023-11-19 (×6): 1 via ORAL
  Administered 2023-11-19: 2 via ORAL
  Administered 2023-11-19: 1 via ORAL
  Filled 2023-11-13: qty 2
  Filled 2023-11-13 (×8): qty 1

## 2023-11-13 MED ORDER — MELATONIN 5 MG PO TABS
5.0000 mg | ORAL_TABLET | Freq: Once | ORAL | Status: AC
  Administered 2023-11-13: 5 mg via ORAL
  Filled 2023-11-13: qty 1

## 2023-11-13 MED ORDER — POLYVINYL ALCOHOL 1.4 % OP SOLN
1.0000 [drp] | OPHTHALMIC | Status: DC | PRN
  Administered 2023-11-13: 1 [drp] via OPHTHALMIC
  Filled 2023-11-13: qty 15

## 2023-11-13 MED ORDER — HYDROCODONE-ACETAMINOPHEN 5-325 MG PO TABS: 1.0000 | ORAL_TABLET | ORAL | 0 refills | Status: AC | PRN

## 2023-11-13 MED ORDER — HYDROCORTISONE SOD SUC (PF) 100 MG IJ SOLR
25.0000 mg | Freq: Three times a day (TID) | INTRAMUSCULAR | Status: AC
  Administered 2023-11-13 – 2023-11-14 (×3): 25 mg via INTRAVENOUS
  Filled 2023-11-13 (×3): qty 0.5

## 2023-11-13 MED ORDER — ASPIRIN 81 MG PO CHEW: 81.0000 mg | CHEWABLE_TABLET | Freq: Two times a day (BID) | ORAL | 0 refills | Status: DC

## 2023-11-13 NOTE — Progress Notes (Signed)
 Physical Therapy Treatment Patient Details Name: Sherri  KEILY [name] MRN: [id number] DOB: 12-22-1940 Today's Date: 11/13/2023   History of Present Illness Pt admitted with R hip fx and not s/p THR.  Pt with hx of fall with non-operative R elbow fx and hip fx not diagnosed until pt in SNF rehab ~ 1 month.  Pt taken home by family and to see PCP who did hip Xray and sent pt directly to [facility] for surgery.  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    PT Comments  Pt continues motivated and progressing slowly with mobility but requiring increased time and significant assist for all tasks.  BP supine 111/73; sitting 83/57 and sitting 10 minutes 107/63 with dizziness resolved.  Pt to standing with RW and assist of two but unable to obtain BP 2* rapidly increasing dizziness.  Pt returned to sitting and supine with increased assist.  RN aware.    If plan is discharge home, recommend the following: A lot of help with walking and/or transfers;A lot of help with bathing/dressing/bathroom;Assistance with cooking/housework;Assist for transportation;Help with stairs or ramp for entrance   Can travel by private vehicle        Equipment Recommendations  None recommended by PT    Recommendations for Other Services OT consult     Precautions / Restrictions Precautions Precautions: Fall;Other (comment) Precaution/Restrictions Comments: Orthostatic but less so than on eval Restrictions Weight Bearing Restrictions Per Provider Order: No RLE Weight Bearing Per Provider Order: Weight bearing as tolerated Other Position/Activity Restrictions: Per pt and dtr, pt has been using R UE as tolerated since 2 days post nondisplaced fx May 9     Mobility  Bed Mobility Overal bed mobility: Needs Assistance Bed Mobility: Supine to Sit, Sit to Supine Rolling: Min assist, Mod assist   Supine to sit: Min assist, Mod assist, +2 for physical assistance Sit to supine: Mod assist,  +2 for physical assistance, +2 for safety/equipment   General bed mobility comments: cues for sequence and use of L LE to self assist. Physical assist to manage LEs and to control trunk.  Increased assist back to supine 2* orthostatic hypotension    Transfers Overall transfer level: Needs assistance Equipment used: Rolling walker (2 wheels) Transfers: Sit to/from Stand Sit to Stand: Mod assist, +2 physical assistance, +2 safety/equipment, From elevated surface           General transfer comment: cues for LE management and use of UEs to self assist    Ambulation/Gait               General Gait Details: Stood with RW only - returned to sitting with c/o increasing dizziness   Stairs             Wheelchair Mobility     Tilt Bed    Modified Rankin (Stroke Patients Only)       Balance Overall balance assessment: Needs assistance Sitting-balance support: Bilateral upper extremity supported, Feet supported Sitting balance-Leahy Scale: Fair                                      Musician Communication: Impaired Factors Affecting Communication: Hearing impaired  Cognition Arousal: Alert Behavior During Therapy: WFL for tasks assessed/performed   PT - Cognitive impairments: No apparent impairments  Following commands: Intact      Cueing Cueing Techniques: Verbal cues, Gestural cues  Exercises Total Joint Exercises Ankle Circles/Pumps: AROM, Both, 15 reps, Supine Quad Sets: AROM, Both, 10 reps, Supine Heel Slides: AAROM, Right, 20 reps, Supine Hip ABduction/ADduction: AAROM, Right, 15 reps, Supine    General Comments        Pertinent Vitals/Pain Pain Assessment Pain Assessment: 0-10 Pain Score: 5  Faces Pain Scale: Hurts a little bit Pain Location: R hip and thigh Pain Descriptors / Indicators: Aching, Sore Pain Intervention(s): Limited activity within patient's tolerance,  Monitored during session, Premedicated before session    Home Living                          Prior Function            PT Goals (current goals can now be found in the care plan section) Acute Rehab PT Goals Patient Stated Goal: Regain IND PT Goal Formulation: With patient Time For Goal Achievement: 11/20/23 Potential to Achieve Goals: Good Progress towards PT goals: Progressing toward goals    Frequency    7X/week      PT Plan      Co-evaluation              AM-PAC PT 6 Clicks Mobility   Outcome Measure  Help needed turning from your back to your side while in a flat bed without using bedrails?: A Little Help needed moving from lying on your back to sitting on the side of a flat bed without using bedrails?: A Lot Help needed moving to and from a bed to a chair (including a wheelchair)?: A Lot Help needed standing up from a chair using your arms (e.g., wheelchair or bedside chair)?: A Lot Help needed to walk in hospital room?: Total Help needed climbing 3-5 steps with a railing? : Total 6 Click Score: 11    End of Session Equipment Utilized During Treatment: Gait belt Activity Tolerance: Patient limited by fatigue;Other (comment) (orthostatic) Patient left: in bed;with call bell/phone within reach;with family/visitor present Nurse Communication: Mobility status PT Visit Diagnosis: Difficulty in walking, not elsewhere classified (R26.2);Muscle weakness (generalized) (M62.81)     Time: 1610-9604 PT Time Calculation (min) (ACUTE ONLY): 29 min  Charges:    $Therapeutic Activity: 23-37 mins PT General Charges $$ ACUTE PT VISIT: 1 Visit                     Thedora Finlay PT Acute Rehabilitation Services Pager [phone] Office [phone]    [facility] 11/13/2023, 4:48 PM

## 2023-11-13 NOTE — Evaluation (Signed)
 Physical Therapy Evaluation Patient Details Name: Sherri [name]  JILLYN STACEY MRN: [id number] DOB: 10/08/1940 Today's Date: 11/13/2023  History of Present Illness  Pt admitted with R hip fx and not s/p THR.  Pt with hx of fall with non-operative R elbow fx and hip fx not diagnosed until pt in SNF rehab ~ 1 month.  Pt taken home by family and to see PCP who did hip Xray and sent pt directly to [facility] for surgery.  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  Clinical Impression  Pt admitted as above and presenting with functional mobility limitations 2* decreased R LE strength/ROM, post op pain, premorbid deconditioning and orthostatic hypotension.  Pt very motivated and should progress to dc home with 24/7 assist.  Pt and dtr state facility not an option based on very recent experience.  Pt would benefit from follow up HHPT to maximize IND and safety at home.      If plan is discharge home, recommend the following: A lot of help with walking and/or transfers;A lot of help with bathing/dressing/bathroom;Assistance with cooking/housework;Assist for transportation;Help with stairs or ramp for entrance   Can travel by private vehicle        Equipment Recommendations None recommended by PT  Recommendations for Other Services  OT consult    Functional Status Assessment Patient has had a recent decline in their functional status and demonstrates the ability to make significant improvements in function in a reasonable and predictable amount of time.     Precautions / Restrictions Precautions Precautions: Fall;Other (comment) Precaution/Restrictions Comments: Orthostatic on eval Restrictions Weight Bearing Restrictions Per Provider Order: No RLE Weight Bearing Per Provider Order: Weight bearing as tolerated Other Position/Activity Restrictions: Per pt and dtr, pt has been using R UE as tolerated since 2 days post nondisplaced fx May 9      Mobility  Bed  Mobility Overal bed mobility: Needs Assistance Bed Mobility: Supine to Sit, Sit to Supine, Rolling Rolling: Min assist, Mod assist   Supine to sit: Min assist, Mod assist, +2 for physical assistance Sit to supine: Mod assist, +2 for physical assistance, +2 for safety/equipment   General bed mobility comments: cues for sequence and use of L LE to self assist. Physical assist to manage LEs and to control trunk.  Increased assist back to supine 2* orthostatic hypotension    Transfers                   General transfer comment: deferred 2* orthostatic hypotension with move to sitting    Ambulation/Gait                  Stairs            Wheelchair Mobility     Tilt Bed    Modified Rankin (Stroke Patients Only)       Balance Overall balance assessment: Needs assistance Sitting-balance support: Bilateral upper extremity supported, Feet supported Sitting balance-Leahy Scale: Fair                                       Pertinent Vitals/Pain Pain Assessment Pain Assessment: Faces Faces Pain Scale: Hurts a little bit Pain Location: R hip and thigh Pain Descriptors / Indicators: Aching, Sore Pain Intervention(s): Limited activity within patient's tolerance, Monitored during session, Premedicated before session    Home Living Family/patient expects to  be discharged to:: Private residence Living Arrangements: Spouse/significant other Available Help at Discharge: Available 24 hours/day;Personal care attendant;Family Type of Home: House Home Access: Ramped entrance     Alternate Level Stairs-Number of Steps: flight Home Layout: Two level;Able to live on main level with bedroom/bathroom Home Equipment: Rollator (4 wheels);BSC/3in1;Shower seat - built in;Grab bars - tub/shower;Wheelchair - Conservation officer, historic buildings (2 wheels) Additional Comments: Per dtr, 24/7 caregivers hired or family.  Dtr is retired Nurse, learning disability Comments: Prior to fall pt was MOD I  but during since return home from SNF was transfers only or wc       Extremity/Trunk Assessment   Upper Extremity Assessment Upper Extremity Assessment: RUE deficits/detail;Generalized weakness RUE Deficits / Details: recent non-operative fx (10/02/23) - ROM WFL.  Strength slightly decreased vs L    Lower Extremity Assessment Lower Extremity Assessment: Generalized weakness;RLE deficits/detail RLE Deficits / Details: AAROM at hip to 90 flex and 15 abd; Strength at hip 2+/5       Communication   Communication Communication: Impaired Factors Affecting Communication: Hearing impaired    Cognition Arousal: Alert Behavior During Therapy: WFL for tasks assessed/performed   PT - Cognitive impairments: No apparent impairments                         Following commands: Intact       Cueing Cueing Techniques: Verbal cues, Gestural cues     General Comments      Exercises Total Joint Exercises Ankle Circles/Pumps: AROM, Both, 15 reps, Supine Quad Sets: AROM, Both, 10 reps, Supine Heel Slides: AAROM, Right, 20 reps, Supine Hip ABduction/ADduction: AAROM, Right, 15 reps, Supine   Assessment/Plan    PT Assessment Patient needs continued PT services  PT Problem List Decreased strength;Decreased range of motion;Decreased activity tolerance;Decreased balance;Decreased mobility;Decreased knowledge of use of DME;Pain       PT Treatment Interventions DME instruction;Gait training;Functional mobility training;Therapeutic activities;Therapeutic exercise;Patient/family education    PT Goals (Current goals can be found in the Care Plan section)  Acute Rehab PT Goals Patient Stated Goal: Regain IND PT Goal Formulation: With patient Time For Goal Achievement: 11/20/23 Potential to Achieve Goals: Good    Frequency 7X/week     Co-evaluation               AM-PAC PT 6 Clicks Mobility  Outcome Measure  Help needed turning from your back to your side while in a flat bed without using bedrails?: A Little Help needed moving from lying on your back to sitting on the side of a flat bed without using bedrails?: A Lot Help needed moving to and from a bed to a chair (including a wheelchair)?: A Lot Help needed standing up from a chair using your arms (e.g., wheelchair or bedside chair)?: Total Help needed to walk in hospital room?: Total Help needed climbing 3-5 steps with a railing? : Total 6 Click Score: 10    End of Session Equipment Utilized During Treatment: Gait belt Activity Tolerance: Other (comment) (orthostatic) Patient left: in bed;with call bell/phone within reach;with family/visitor present Nurse Communication: Mobility status PT Visit Diagnosis: Difficulty in walking, not elsewhere classified (R26.2);Muscle weakness (generalized) (M62.81)    Time: 1610-9604 PT Time Calculation (min) (ACUTE ONLY): 42 min   Charges:   PT Evaluation $PT Eval Low Complexity: 1 Low PT Treatments $Therapeutic Exercise: 8-22 mins  PT General Charges $$ ACUTE PT VISIT: 1 Visit         Thedora Finlay PT Acute Rehabilitation Services Pager [phone] Office [phone]   Auria Mckinlay 11/13/2023, 11:59 AM

## 2023-11-13 NOTE — Progress Notes (Signed)
    Subjective:  Patient reports pain as mild to moderate.  Denies N/V/CP/SOB/Abd pain. She denies any tingling or numbness in LE bilaterally. She reports pain is well controlled with tylenol .  Patient's daughter at bedside. All questions solicited and answered.  They are wanting d/c home. Report they have 24/7 aid care.   Objective:   VITALS:   Vitals:   11/13/23 0530 11/13/23 0917 11/13/23 1002 11/13/23 1318  BP: 119/66 (!) 98/57 (!) 93/59 109/63  Pulse: 88 98  85  Resp: 15 17  17   Temp: 97.9 F (36.6 C) 98.2 F (36.8 C)  (!) 97.5 F (36.4 C)  TempSrc: Oral Oral    SpO2: 100% 95%  100%  Weight:      Height:        NAD Neurologically intact ABD soft Neurovascular intact Sensation intact distally Intact pulses distally Dorsiflexion/Plantar flexion intact Incision: dressing C/D/I No cellulitis present Compartment soft Patient is performing active ROM of right elbow, wound care is following.   Lab Results  Component Value Date   WBC 9.4 11/12/2023   HGB 10.3 (L) 11/12/2023   HCT 31.8 (L) 11/12/2023   MCV 108.9 (H) 11/12/2023   PLT 263 11/12/2023   BMET    Component Value Date/Time   NA 131 (L) 11/13/2023 0335   NA 139 06/20/2019 1335   NA 136 02/22/2015 0000   K 4.5 11/13/2023 0335   K 4.4 02/22/2015 0000   CL 95 (L) 11/13/2023 0335   CL 98 02/22/2015 0000   CO2 28 11/13/2023 0335   CO2 30 02/22/2015 0000   GLUCOSE 160 (H) 11/13/2023 0335   BUN 7 (L) 11/13/2023 0335   BUN 10 06/20/2019 1335   CREATININE 0.56 11/13/2023 0335   CREATININE 1.03 (H) 08/06/2023 1510   CALCIUM  7.6 (L) 11/13/2023 0335   CALCIUM  9.6 02/22/2015 0000   EGFR 55 (L) 08/06/2023 1510   GFRNONAA >60 11/13/2023 0335   GFRNONAA 56 (L) 09/26/2020 1536     Assessment/Plan: 1 Day Post-Op   Principal Problem:   Hip fracture (HCC)   WBAT with walker DVT ppx: Aspirin , SCDs, TEDS PO pain control PT/OT: To come today.  Dispo:  - In regards to right elbow: Dr. Curtiss Dowdy recommends  ROM and weightbearing to tolerance. Can follow-up with him as needed. Wound care to follow for skin.  - Patient under care of the medical team, disposition per their recommendation. Patient and family would like her d/c home. Pain medication printed in chart just in case. Can send electronic if needed.    Harman Lightning 11/13/2023, 1:29 PM   [facility]  [facility] [address], [place], [place] [address] Phone: [phone] [email or web address] Facebook  [employer]

## 2023-11-13 NOTE — Plan of Care (Signed)
  Problem: Education: Goal: Knowledge of General Education information will improve Description: Including pain rating scale, medication(s)/side effects and non-pharmacologic comfort measures Outcome: Progressing   Problem: Health Behavior/Discharge Planning: Goal: Ability to manage health-related needs will improve Outcome: Progressing   Problem: Clinical Measurements: Goal: Ability to maintain clinical measurements within normal limits will improve Outcome: Progressing Goal: Will remain free from infection Outcome: Progressing Goal: Diagnostic test results will improve Outcome: Progressing Goal: Respiratory complications will improve Outcome: Progressing Goal: Cardiovascular complication will be avoided Outcome: Progressing   Problem: Activity: Goal: Risk for activity intolerance will decrease Outcome: Progressing   Problem: Nutrition: Goal: Adequate nutrition will be maintained Outcome: Adequate for Discharge   Problem: Coping: Goal: Level of anxiety will decrease Outcome: Progressing   Problem: Elimination: Goal: Will not experience complications related to bowel motility Outcome: Progressing Goal: Will not experience complications related to urinary retention Outcome: Completed/Met   Problem: Pain Managment: Goal: General experience of comfort will improve and/or be controlled Outcome: Progressing   Problem: Safety: Goal: Ability to remain free from injury will improve Outcome: Progressing   Problem: Skin Integrity: Goal: Risk for impaired skin integrity will decrease Outcome: Progressing   Problem: Education: Goal: Knowledge of the prescribed therapeutic regimen will improve Outcome: Progressing Goal: Understanding of discharge needs will improve Outcome: Progressing Goal: Individualized Educational Video(s) Outcome: Completed/Met   Problem: Clinical Measurements: Goal: Postoperative complications will be avoided or minimized Outcome: Progressing    Problem: Pain Management: Goal: Pain level will decrease with appropriate interventions Outcome: Progressing   Problem: Skin Integrity: Goal: Will show signs of wound healing Outcome: Progressing

## 2023-11-13 NOTE — Plan of Care (Signed)
  Problem: Education: Goal: Knowledge of General Education information will improve Description: Including pain rating scale, medication(s)/side effects and non-pharmacologic comfort measures Outcome: Progressing   Problem: Health Behavior/Discharge Planning: Goal: Ability to manage health-related needs will improve Outcome: Progressing   Problem: Clinical Measurements: Goal: Ability to maintain clinical measurements within normal limits will improve Outcome: Progressing Goal: Will remain free from infection Outcome: Progressing Goal: Diagnostic test results will improve Outcome: Progressing Goal: Respiratory complications will improve Outcome: Progressing Goal: Cardiovascular complication will be avoided Outcome: Progressing   Problem: Activity: Goal: Risk for activity intolerance will decrease Outcome: Progressing   Problem: Nutrition: Goal: Adequate nutrition will be maintained Outcome: Progressing   Problem: Coping: Goal: Level of anxiety will decrease Outcome: Progressing   Problem: Elimination: Goal: Will not experience complications related to bowel motility Outcome: Progressing Goal: Will not experience complications related to urinary retention Outcome: Progressing   Problem: Pain Managment: Goal: General experience of comfort will improve and/or be controlled Outcome: Progressing   Problem: Safety: Goal: Ability to remain free from injury will improve Outcome: Progressing   Problem: Skin Integrity: Goal: Risk for impaired skin integrity will decrease Outcome: Progressing   Problem: Education: Goal: Knowledge of the prescribed therapeutic regimen will improve Outcome: Progressing Goal: Understanding of discharge needs will improve Outcome: Progressing   Problem: Activity: Goal: Ability to avoid complications of mobility impairment will improve Outcome: Progressing Goal: Ability to tolerate increased activity will improve Outcome: Progressing    Problem: Clinical Measurements: Goal: Postoperative complications will be avoided or minimized Outcome: Progressing   Problem: Pain Management: Goal: Pain level will decrease with appropriate interventions Outcome: Progressing   Problem: Skin Integrity: Goal: Will show signs of wound healing Outcome: Progressing

## 2023-11-13 NOTE — Progress Notes (Signed)
 [facility]  PROGRESS NOTE  Sherri  [name] ZOX:[id number] DOB: 04-15-1941 DOA: 11/10/2023 PCP: Colene Dauphin, MD   Brief HPI:   83 y.o. female with medical history significant of anxiety, GERD, rheumatoid arthritis, hypertension, multiple sclerosis, lipidemia who presented to the emergency department due to hip pain.  Patient was admitted in May.  During this hospitalization she was treated for cellulitis and sepsis.  She had a mechanical fall has CT scan which showed no evidence of fracture.  She been having chronic pain of her left hip and difficulty walking.  She went to her primary care doctor who ordered outpatient imaging which showed impacted hip fracture.  She was referred to the emergency department for further assessment.     Assessment/Plan:    Right impacted hip fracture -Subacute displaced right femoral neck fracture, status post total hip arthroplasty -Orthopedics following Aspirin  81 mg p.o. twice daily for 45 days for DVT prophylaxis     # Hypertension -continue carvedilol , Lasix  -Hold carvedilol  due to hypotension today   # Chronic pain -continue Cymbalta  As needed fentanyl    # Urinary retention -continue oxybutynin   # GERD -continue Protonix    # Rheumatoid arthritis -continue prednisone  -Patient was hypotensive today -Will give a stress dose of hydrocortisone    # Hyperlipidemia -continue statin   # Hypokalemia - Replete   Medications     aspirin   81 mg Oral BID   atorvastatin   10 mg Oral Daily   carvedilol   3.125 mg Oral BID WC   Chlorhexidine  Gluconate Cloth  6 each Topical Daily   docusate sodium   100 mg Oral Daily   DULoxetine   40 mg Oral QHS   feeding supplement  237 mL Oral BID BM   furosemide   20 mg Oral Daily   leptospermum manuka honey  1 Application Topical Daily   mupirocin  ointment  1 Application Nasal BID   oxybutynin  5 mg Oral Daily   pantoprazole   40 mg Oral BID AC   senna  1 tablet Oral BID     Data Reviewed:    CBG:  No results for input(s): GLUCAP in the last 168 hours.  SpO2: 100 % O2 Flow Rate (L/min): 2 L/min    Vitals:   11/12/23 1948 11/12/23 2205 11/13/23 0142 11/13/23 0530  BP: 118/69 132/84 113/75 119/66  Pulse: 94 94 (!) 107 88  Resp: 13 14 14 15   Temp: 97.8 F (36.6 C) 97.6 F (36.4 C) 97.7 F (36.5 C) 97.9 F (36.6 C)  TempSrc: Oral Oral Oral Oral  SpO2: 100% 100% 100% 100%  Weight:      Height:          Data Reviewed:  Basic Metabolic Panel: Recent Labs  Lab 11/10/23 1810 11/12/23 0354 11/13/23 0335  NA 129* 126* 131*  K 3.5 3.2* 4.5  CL 87* 89* 95*  CO2 29 28 28   GLUCOSE 170* 97 160*  BUN 9 10 7*  CREATININE 0.79 0.60 0.56  CALCIUM  8.6* 7.8* 7.6*    CBC: Recent Labs  Lab 11/10/23 1810 11/12/23 0354  WBC 7.3 9.4  HGB 11.0* 10.3*  HCT 33.4* 31.8*  MCV 106.7* 108.9*  PLT 315 263    LFT No results for input(s): AST, ALT, ALKPHOS, BILITOT, PROT, ALBUMIN in the last 168 hours.   Antibiotics: Anti-infectives (From admission, onward)    Start     Dose/Rate Route Frequency Ordered Stop   11/13/23 0600  vancomycin  (VANCOCIN ) IVPB 1000 mg/200 mL premix  1,000 mg 200 mL/hr over 60 Minutes Intravenous On call to O.R. 11/12/23 1431 11/13/23 0641   11/12/23 2200  ceFAZolin (ANCEF) IVPB 2g/100 mL premix        2 g 200 mL/hr over 30 Minutes Intravenous Every 6 hours 11/12/23 2006 11/13/23 0430   11/12/23 2200  vancomycin  (VANCOCIN ) IVPB 1000 mg/200 mL premix        1,000 mg 200 mL/hr over 60 Minutes Intravenous  Once 11/12/23 2006 11/13/23 0040   11/12/23 1442  sodium chloride  0.9 % with cefTRIAXone  (ROCEPHIN ) ADS Med  Status:  Discontinued       Note to Pharmacy: Jackson Massed W: cabinet override      11/12/23 1442 11/12/23 1449        DVT prophylaxis: SCDs  Code Status: Full code  Family Communication: No family at bedside   CONSULTS orthopedics   Subjective   Denies any complaints.  BP soft  today.   Objective    Physical Examination:  General-appears in no acute distress Heart-S1-S2, regular, no murmur auscultated Lungs-clear to auscultation bilaterally, no wheezing or crackles auscultated Abdomen-soft, nontender, no organomegaly Extremities-no edema in the lower extremities Neuro-alert, oriented x3, no focal deficit noted  Status is: Inpatient:             [name]   [facility] If 7PM-7AM, please contact night-coverage at [email or web address], Office  [phone]   11/13/2023, 8:06 AM  LOS: 2 days

## 2023-11-13 NOTE — TOC Progression Note (Addendum)
 Transition of Care [facility]) - Progression Note    Patient Details  Name: Sherri [name]  CENIYA FOWERS MRN: [id number] Date of Birth: 08-19-1940  Transition of [facility]) CM/SW Contact  Bari Leys, RN Phone Number: 11/13/2023, 2:11 PM  Clinical Narrative:  PT eval competed, recommendation for [facility] PT. Met with patient, daughter and spouse at bedside, agreeable, spouse reports patient already on service with [facility] agency since dc from SNF, thinks it is [facility]. NCM will contact HH for confirmation. TOC will continue to follow.    -2:44pm Artavia w/[facility] confirmed patient currently on service for PT/OT, added to AVS. TOC will continue to follow.     Expected Discharge Plan: Home w Home Health Services Barriers to Discharge: Continued Medical Work up  Expected Discharge Plan and Services       Living arrangements for the past 2 months: Single Family Home                                       Social Determinants of Health (SDOH) Interventions SDOH Screenings   Food Insecurity: No Food Insecurity (11/10/2023)  Housing: Low Risk  (11/10/2023)  Transportation Needs: No Transportation Needs (11/10/2023)  Utilities: Not At Risk (11/10/2023)  Alcohol  Screen: Low Risk  (02/21/2022)  Depression (PHQ2-9): Low Risk  (07/27/2023)  Financial Resource Strain: Low Risk  (02/21/2022)  Physical Activity: Inactive (02/21/2022)  Social Connections: Socially Integrated (11/10/2023)  Recent Concern: Social Connections - Moderately Isolated (10/03/2023)  Stress: Stress Concern Present (02/21/2022)  Tobacco Use: Medium Risk (11/12/2023)    Readmission Risk Interventions     No data to display

## 2023-11-14 DIAGNOSIS — S72001A Fracture of unspecified part of neck of right femur, initial encounter for closed fracture: Secondary | ICD-10-CM | POA: Diagnosis not present

## 2023-11-14 DIAGNOSIS — I959 Hypotension, unspecified: Secondary | ICD-10-CM | POA: Diagnosis not present

## 2023-11-14 DIAGNOSIS — S72009A Fracture of unspecified part of neck of unspecified femur, initial encounter for closed fracture: Secondary | ICD-10-CM | POA: Diagnosis not present

## 2023-11-14 LAB — BASIC METABOLIC PANEL WITH GFR
Anion gap: 9 (ref 5–15)
BUN: 11 mg/dL (ref 8–23)
CO2: 26 mmol/L (ref 22–32)
Calcium: 7.8 mg/dL — ABNORMAL LOW (ref 8.9–10.3)
Chloride: 95 mmol/L — ABNORMAL LOW (ref 98–111)
Creatinine, Ser: 0.75 mg/dL (ref 0.44–1.00)
GFR, Estimated: >60 mL/min (ref >60)
Glucose, Bld: 137 mg/dL — ABNORMAL HIGH (ref 70–99)
Potassium: 4.3 mmol/L (ref 3.5–5.1)
Sodium: 130 mmol/L — ABNORMAL LOW (ref 135–145)

## 2023-11-14 LAB — CBC
HCT: 20.6 % — ABNORMAL LOW (ref 36.0–46.0)
HCT: 20.3 % — ABNORMAL LOW (ref 36.0–46.0)
Hemoglobin: 6.5 g/dL — CL (ref 12.0–15.0)
Hemoglobin: 6.6 g/dL — CL (ref 12.0–15.0)
MCH: 34.6 pg — ABNORMAL HIGH (ref 26.0–34.0)
MCH: 34.9 pg — ABNORMAL HIGH (ref 26.0–34.0)
MCHC: 31.6 g/dL (ref 30.0–36.0)
MCHC: 32.5 g/dL (ref 30.0–36.0)
MCV: 109.6 fL — ABNORMAL HIGH (ref 80.0–100.0)
MCV: 107.4 fL — ABNORMAL HIGH (ref 80.0–100.0)
Platelets: 248 10*3/uL (ref 150–400)
Platelets: 276 10*3/uL (ref 150–400)
RBC: 1.88 MIL/uL — ABNORMAL LOW (ref 3.87–5.11)
RBC: 1.89 MIL/uL — ABNORMAL LOW (ref 3.87–5.11)
RDW: 15.9 % — ABNORMAL HIGH (ref 11.5–15.5)
RDW: 16 % — ABNORMAL HIGH (ref 11.5–15.5)
WBC: 12.7 10*3/uL — ABNORMAL HIGH (ref 4.0–10.5)
WBC: 12.4 10*3/uL — ABNORMAL HIGH (ref 4.0–10.5)
nRBC: 0 % (ref 0.0–0.2)
nRBC: 0 % (ref 0.0–0.2)

## 2023-11-14 LAB — PREPARE RBC (CROSSMATCH)

## 2023-11-14 MED ORDER — SODIUM CHLORIDE 0.9% IV SOLUTION: Freq: Once | INTRAVENOUS | Status: AC

## 2023-11-14 NOTE — Progress Notes (Signed)
 Maire  KELILAH HEBARD  MRN: [id number] DOB/Age: 10/25/1940 83 y.o. [facility] Procedure: Procedure(s) (LRB): ARTHROPLASTY, HIP, TOTAL, ANTERIOR APPROACH (Right)     Subjective: Currently receiving transfusion for ABL anemia. Hip pain controlled  Vital Signs Temp:  [97.5 F (36.4 C)-98.5 F (36.9 C)] 97.9 F (36.6 C) (06/21 0926) Pulse Rate:  [85-97] 88 (06/21 0926) Resp:  [17-18] 18 (06/21 0926) BP: (93-119)/(59-72) 111/72 (06/21 0926) SpO2:  [96 %-100 %] 96 % (06/21 0926)  Lab Results Recent Labs    11/14/23 0335 11/14/23 0720  WBC 12.7* 12.4*  HGB 6.5* 6.6*  HCT 20.6* 20.3*  PLT 248 276   BMET Recent Labs    11/13/23 0335 11/14/23 0335  NA 131* 130*  K 4.5 4.3  CL 95* 95*  CO2 28 26  GLUCOSE 160* 137*  BUN 7* 11  CREATININE 0.56 0.75  CALCIUM  7.6* 7.8*   INR  Date Value Ref Range Status  10/02/2023 1.1 0.8 - 1.2 Final    Comment:    (NOTE) INR goal varies based on device and disease states. Performed at [facility], [address] [place], [place] [address]      Exam Right hip dressing with scant bloody drainage NVI Bilat LE        Plan Resume PT for mobilization once stable after Blood transfusion Continue medical care by the hospitalist team  [facility] PA-C  11/14/2023, 9:39 AM Contact # [phone]

## 2023-11-14 NOTE — Evaluation (Signed)
 Occupational Therapy Evaluation Patient Details Name: BRECKYN TROYER MRN: [id number] DOB: [birth year] Today's Date: 11/14/2023   History of Present Illness   Pt admitted with R hip fx and not s/p THR.  Pt with hx of fall with non-operative R elbow fx and hip fx not diagnosed until pt in SNF rehab ~ 1 month.  Pt taken home by family and to see PCP who did hip Xray and sent pt directly to [facility] for surgery.  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     Clinical Impressions Patient is a 83 year old female who was admitted for above. Patient was living at home with caregiver support prior level. Currently, patient is +2 for transfer to recliner with knee buckling. Patient was noted to have decreased functional activity tolerance, decreased endurance, decreased standing balance, decreased safety awareness, and decreased knowledge of AD/AE impacting participation in ADLs. Plan is for patient to d/c home with [facility] services and family/caregiver support.   Blood pressure Supine: 128/71 Sitting EOB: 111/63 Standing: 104/62     If plan is discharge home, recommend the following:   Two people to help with walking and/or transfers;A lot of help with bathing/dressing/bathroom;Assistance with cooking/housework;Direct supervision/assist for medications management;Assist for transportation;Help with stairs or ramp for entrance;Direct supervision/assist for financial management     Functional Status Assessment   Patient has had a recent decline in their functional status and demonstrates the ability to make significant improvements in function in a reasonable and predictable amount of time.     Equipment Recommendations   None recommended by OT      Precautions/Restrictions   Precautions Precautions: Fall;Other (comment) Precaution/Restrictions Comments: Orthostatic but less so than on eval Restrictions Weight Bearing Restrictions Per Provider  Order: No RLE Weight Bearing Per Provider Order: Weight bearing as tolerated Other Position/Activity Restrictions: per ortho PA note, patient is WBAT and ROM as tolerated RUE     Mobility Bed Mobility Overal bed mobility: Needs Assistance Bed Mobility: Supine to Sit     Supine to sit: Min assist, Mod assist, +2 for physical assistance     General bed mobility comments: cues for sequence and use of L LE to self assist.  Physical assist to manage R LE, to control trunk and to complete rotation with use of bedpad              Balance Overall balance assessment: Needs assistance Sitting-balance support: Bilateral upper extremity supported, Feet supported Sitting balance-Leahy Scale: Fair     Standing balance support: Bilateral upper extremity supported Standing balance-Leahy Scale: Poor         ADL either performed or assessed with clinical judgement   ADL Overall ADL's : Needs assistance/impaired Eating/Feeding: Modified independent;Sitting   Grooming: Sitting;Set up   Upper Body Bathing: Sitting;Supervision/ safety;Set up   Lower Body Bathing: Sitting/lateral leans;Total assistance   Upper Body Dressing : Sitting;Set up;Supervision/safety   Lower Body Dressing: Sitting/lateral leans;Total assistance   Toilet Transfer: +2 for physical assistance;+2 for safety/equipment;Moderate assistance;Rolling walker (2 wheels);Stand-pivot Statistician Details (indicate cue type and reason): to recliner with buckling of knee with movement. Toileting- Clothing Manipulation and Hygiene: Total assistance;Bed level               Vision   Vision Assessment?: No apparent visual deficits            Pertinent Vitals/Pain Pain Assessment Pain Assessment: Faces Faces Pain Scale: Hurts little more  Pain Location: R hip/thigh with WB Pain Descriptors / Indicators: Aching, Sore Pain Intervention(s): Limited activity within patient's tolerance, Monitored during session,  Repositioned     Extremity/Trunk Assessment Upper Extremity Assessment Upper Extremity Assessment: Overall WFL for tasks assessed;RUE deficits/detail RUE Deficits / Details: recent non-operative fx of olecranon (10/02/23) - ROM WFL.  Strength slightly decreased vs L, noted to have dressing on posterior elbow.           Communication Communication Communication: Impaired Factors Affecting Communication: Hearing impaired   Cognition Arousal: Alert Behavior During Therapy: Flat affect Cognition: History of cognitive impairments             OT - Cognition Comments: patient's caregiver was present in room during session.                 Following commands: Intact                  Home Living Family/patient expects to be discharged to:: Private residence Living Arrangements: Spouse/significant other;Other (Comment) (caregivers) Available Help at Discharge: Available 24 hours/day;Personal care attendant;Family Type of Home: House Home Access: Ramped entrance     Home Layout: Two level;Able to live on main level with bedroom/bathroom Alternate Level Stairs-Number of Steps: flight   Bathroom Shower/Tub: Producer, television/film/video: Handicapped height Bathroom Accessibility: Yes   Home Equipment: Rollator (4 wheels);BSC/3in1;Shower seat - built in;Grab bars - tub/shower;Wheelchair - Conservation officer, historic buildings (2 wheels)   Additional Comments: Per dtr, 24/7 caregivers hired or family.  Dtr is retired Community education officer Prior Level of Function : Independent/Modified Independent             Mobility Comments: Prior to fall pt was MOD I  but during since return home from SNF was transfers only or wc      OT Problem List: Impaired balance (sitting and/or standing);Decreased safety awareness;Decreased knowledge of precautions;Decreased activity tolerance;Decreased knowledge of use of DME or AE   OT Treatment/Interventions:  Self-care/ADL training;DME and/or AE instruction;Therapeutic activities;Balance training;Energy conservation;Patient/family education      OT Goals(Current goals can be found in the care plan section)   Acute Rehab OT Goals Patient Stated Goal: to get home OT Goal Formulation: With patient/family Time For Goal Achievement: 11/28/23 Potential to Achieve Goals: Fair   OT Frequency:  Min 2X/week    Co-evaluation   Reason for Co-Treatment: For patient/therapist safety;To address functional/ADL transfers PT goals addressed during session: Mobility/safety with mobility OT goals addressed during session: ADL's and self-care      AM-PAC OT 6 Clicks Daily Activity     Outcome Measure Help from another person eating meals?: A Little Help from another person taking care of personal grooming?: A Little Help from another person toileting, which includes using toliet, bedpan, or urinal?: Total Help from another person bathing (including washing, rinsing, drying)?: A Lot Help from another person to put on and taking off regular upper body clothing?: A Little Help from another person to put on and taking off regular lower body clothing?: A Lot 6 Click Score: 14   End of Session Equipment Utilized During Treatment: Rolling walker (2 wheels);Gait belt Nurse Communication: Other (comment) (BPs during session)  Activity Tolerance: Patient tolerated treatment well Patient left: in chair;with call bell/phone within reach;with chair alarm set;with family/visitor present  OT Visit Diagnosis: Unsteadiness on feet (R26.81);Other abnormalities of gait and mobility (R26.89);Muscle weakness (generalized) (M62.81)  Time: 1350-1409 OT Time Calculation (min): 19 min Charges:  OT General Charges $OT Visit: 1 Visit OT Evaluation $OT Eval Low Complexity: 1 Low  Evelyna Folker OTR/L, MS Acute Rehabilitation Department Office# [phone]   Geofm CHRISTELLA Dance 11/14/2023, 4:24 PM

## 2023-11-14 NOTE — Progress Notes (Signed)
 Date and time results received: 11/14/23 0530 (use smartphrase .now to insert current time)  Test: CBC Critical Value: Hgb: 6.5  Name of Provider Notified: Lavanda Horns, NP  Orders Received? Or Actions Taken?:  STAT recollect ordered.

## 2023-11-14 NOTE — Progress Notes (Signed)
     Patient Name: KYMORA SCIARA           DOB: [birth year]  MRN: [id number]      Admission Date: 11/10/2023  Attending Provider: Drusilla Sabas RAMAN, MD  Primary Diagnosis: Hip fracture [facility])   Level of care: Med-Surg   OVERNIGHT PROGRESS REPORT   Notified of hemoglobin change.  Hgb 10.3 --> 6.5. STAT repeat ordered.  No acute changes reported.  Hemodynamically stable. No melena, hematochezia, or other overt bleeding reported tonight.  total hip arthroplasty completed on 6/19, EBL ~ 600 cc.   Zeshan Sena, DNP, ACNPC- AG [facility] [place]

## 2023-11-14 NOTE — Progress Notes (Signed)
 Physical Therapy Treatment Patient Details Name: Sherri [name] MRN: [id number] DOB: 1940-06-21 Today's Date: 11/14/2023   History of Present Illness Pt admitted with R hip fx and not s/p THR.  Pt with hx of fall with non-operative R elbow fx and hip fx not diagnosed until pt in SNF rehab ~ 1 month.  Pt taken home by family and to see PCP who did hip Xray and sent pt directly to [facility] for surgery.  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    PT Comments  Pt requesting back to bed my butt is getting sore.  Pt assisted from chair to step pvt with RW to sit at bedside.  Pt continues to fatigue easily and with limited WB tolerance on R UE or LE but continues to deny dizziness with OOB activity.    If plan is discharge home, recommend the following: A lot of help with walking and/or transfers;A lot of help with bathing/dressing/bathroom;Assistance with cooking/housework;Assist for transportation;Help with stairs or ramp for entrance   Can travel by private vehicle        Equipment Recommendations  None recommended by PT    Recommendations for Other Services OT consult     Precautions / Restrictions Precautions Precautions: Fall;Other (comment) Precaution/Restrictions Comments: Watch BPs, has been Restrictions Weight Bearing Restrictions Per Provider Order: No RLE Weight Bearing Per Provider Order: Weight bearing as tolerated Other Position/Activity Restrictions: per ortho PA note, patient is WBAT and ROM as tolerated RUE     Mobility  Bed Mobility Overal bed mobility: Needs Assistance Bed Mobility: Sit to Supine     Supine to sit: Min assist, Mod assist, +2 for physical assistance Sit to supine: Min assist, Mod assist, +2 for safety/equipment   General bed mobility comments: cues for sequence and use of L LE to self assist.  Physical assist to manage R LE, to control trunk and to complete rotation with use of bedpad     Transfers Overall transfer level: Needs assistance Equipment used: Rolling walker (2 wheels) Transfers: Sit to/from Stand, Bed to chair/wheelchair/BSC Sit to Stand: Min assist, Mod assist, +2 physical assistance, +2 safety/equipment, From elevated surface   Step pivot transfers: Min assist, Mod assist, +2 physical assistance, +2 safety/equipment       General transfer comment: cues for LE management and use of UEs to self assist; Step pvt with RW chair to bed with noted buckling at R LE with attempts to [facility]    Ambulation/Gait               General Gait Details: chair to bed only - limited by fatigue, R hip pain and R elbow discomfort   Stairs             Wheelchair Mobility     Tilt Bed    Modified Rankin (Stroke Patients Only)       Balance Overall balance assessment: Needs assistance Sitting-balance support: Bilateral upper extremity supported, Feet supported Sitting balance-Leahy Scale: Fair     Standing balance support: Bilateral upper extremity supported Standing balance-Leahy Scale: Poor                              Communication Communication Communication: Impaired Factors Affecting Communication: Hearing impaired  Cognition Arousal: Alert Behavior During Therapy: WFL for tasks assessed/performed   PT - Cognitive impairments: No apparent impairments  Following commands: Intact      Cueing Cueing Techniques: Verbal cues, Gestural cues  Exercises Total Joint Exercises Ankle Circles/Pumps: AROM, Both, 15 reps, Supine Quad Sets: AROM, Both, 10 reps, Supine Heel Slides: AAROM, Right, 20 reps, Supine Hip ABduction/ADduction: AAROM, Right, 15 reps, Supine    General Comments        Pertinent Vitals/Pain Pain Assessment Pain Assessment: 0-10 Pain Score: 6  Faces Pain Scale: Hurts little more Pain Location: R hip/thigh with WB Pain Descriptors / Indicators: Aching, Sore Pain  Intervention(s): Limited activity within patient's tolerance, Monitored during session, Premedicated before session, Ice applied    Home Living Family/patient expects to be discharged to:: Private residence Living Arrangements: Spouse/significant other;Other (Comment) (caregivers) Available Help at Discharge: Available 24 hours/day;Personal care attendant;Family Type of Home: House Home Access: Ramped entrance     Alternate Level Stairs-Number of Steps: flight Home Layout: Two level;Able to live on main level with bedroom/bathroom Home Equipment: Rollator (4 wheels);BSC/3in1;Shower seat - built in;Grab bars - tub/shower;Wheelchair - Conservation officer, historic buildings (2 wheels) Additional Comments: Per dtr, 24/7 caregivers hired or family.  Dtr is retired Engineer, building services            PT Goals (current goals can now be found in the care plan section) Acute Rehab PT Goals Patient Stated Goal: Regain IND PT Goal Formulation: With patient Time For Goal Achievement: 11/20/23 Potential to Achieve Goals: Good Progress towards PT goals: Progressing toward goals    Frequency    7X/week      PT Plan      Co-evaluation PT/OT/SLP Co-Evaluation/Treatment: Yes Reason for Co-Treatment: For patient/therapist safety;To address functional/ADL transfers PT goals addressed during session: Mobility/safety with mobility OT goals addressed during session: ADL's and self-care      AM-PAC PT 6 Clicks Mobility   Outcome Measure  Help needed turning from your back to your side while in a flat bed without using bedrails?: A Little Help needed moving from lying on your back to sitting on the side of a flat bed without using bedrails?: A Lot Help needed moving to and from a bed to a chair (including a wheelchair)?: A Lot Help needed standing up from a chair using your arms (e.g., wheelchair or bedside chair)?: A Lot Help needed to walk in hospital room?: Total Help needed climbing 3-5  steps with a railing? : Total 6 Click Score: 11    End of Session Equipment Utilized During Treatment: Gait belt Activity Tolerance: Patient tolerated treatment well;Patient limited by pain;Patient limited by fatigue Patient left: in bed;with call bell/phone within reach;with bed alarm set;with family/visitor present Nurse Communication: Mobility status PT Visit Diagnosis: Difficulty in walking, not elsewhere classified (R26.2);Muscle weakness (generalized) (M62.81)     Time: 8369-8356 PT Time Calculation (min) (ACUTE ONLY): 13 min  Charges:    $Therapeutic Activity: 8-22 mins PT General Charges $$ ACUTE PT VISIT: 1 Visit                     Katrinka Acton PT Acute Rehabilitation Services Pager [phone] Office [phone]    [facility] 11/14/2023, 4:57 PM

## 2023-11-14 NOTE — Progress Notes (Signed)
 Physical Therapy Treatment Patient Details Name: Sherri [name] MRN: [id number] DOB: 09-22-1940 Today's Date: 11/14/2023   History of Present Illness Pt admitted with R hip fx and not s/p THR.  Pt with hx of fall with non-operative R elbow fx and hip fx not diagnosed until pt in SNF rehab ~ 1 month.  Pt taken home by family and to see PCP who did hip Xray and sent pt directly to [facility] for surgery.  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    PT Comments  Pt very cooperative and performed therex program with assist and min pain reported.  OOB deferred pending completion of transfusion.    If plan is discharge home, recommend the following: A lot of help with walking and/or transfers;A lot of help with bathing/dressing/bathroom;Assistance with cooking/housework;Assist for transportation;Help with stairs or ramp for entrance   Can travel by private vehicle        Equipment Recommendations  None recommended by PT    Recommendations for Other Services OT consult     Precautions / Restrictions Precautions Precautions: Fall;Other (comment) Precaution/Restrictions Comments: Orthostatic but less so than on eval Restrictions Weight Bearing Restrictions Per Provider Order: No RLE Weight Bearing Per Provider Order: Weight bearing as tolerated Other Position/Activity Restrictions: Per pt and dtr, pt has been using R UE as tolerated since 2 days post nondisplaced fx May 9     Mobility  Bed Mobility               General bed mobility comments: Deferred pending completion of transfusion    Transfers                        Ambulation/Gait                   Stairs             Wheelchair Mobility     Tilt Bed    Modified Rankin (Stroke Patients Only)       Balance                                            Communication Communication Communication: Impaired Factors Affecting  Communication: Hearing impaired  Cognition Arousal: Alert Behavior During Therapy: WFL for tasks assessed/performed   PT - Cognitive impairments: No apparent impairments                         Following commands: Intact      Cueing Cueing Techniques: Verbal cues, Gestural cues  Exercises Total Joint Exercises Ankle Circles/Pumps: AROM, Both, 15 reps, Supine Quad Sets: AROM, Both, 10 reps, Supine Heel Slides: AAROM, Right, 20 reps, Supine Hip ABduction/ADduction: AAROM, Right, 15 reps, Supine    General Comments        Pertinent Vitals/Pain Pain Assessment Pain Assessment: 0-10 Pain Score: 3  Pain Location: R hip and thigh Pain Descriptors / Indicators: Aching, Sore Pain Intervention(s): Limited activity within patient's tolerance, Monitored during session, Premedicated before session, Ice applied    Home Living                          Prior Function            PT Goals (  current goals can now be found in the care plan section) Acute Rehab PT Goals Patient Stated Goal: Regain IND PT Goal Formulation: With patient Time For Goal Achievement: 11/20/23 Potential to Achieve Goals: Good Progress towards PT goals: Progressing toward goals    Frequency    7X/week      PT Plan      Co-evaluation              AM-PAC PT 6 Clicks Mobility   Outcome Measure  Help needed turning from your back to your side while in a flat bed without using bedrails?: A Little Help needed moving from lying on your back to sitting on the side of a flat bed without using bedrails?: A Lot Help needed moving to and from a bed to a chair (including a wheelchair)?: A Lot Help needed standing up from a chair using your arms (e.g., wheelchair or bedside chair)?: A Lot Help needed to walk in hospital room?: Total Help needed climbing 3-5 steps with a railing? : Total 6 Click Score: 11    End of Session Equipment Utilized During Treatment: Gait belt Activity  Tolerance: Patient tolerated treatment well Patient left: in bed;with call bell/phone within reach;with family/visitor present Nurse Communication: Mobility status PT Visit Diagnosis: Difficulty in walking, not elsewhere classified (R26.2);Muscle weakness (generalized) (M62.81)     Time: 8972-8957 PT Time Calculation (min) (ACUTE ONLY): 15 min  Charges:    $Therapeutic Exercise: 8-22 mins PT General Charges $$ ACUTE PT VISIT: 1 Visit                     Katrinka Acton PT Acute Rehabilitation Services Pager [phone] Office [phone]    Tonnia Bardin 11/14/2023, 12:52 PM

## 2023-11-14 NOTE — Plan of Care (Signed)

## 2023-11-14 NOTE — Progress Notes (Signed)
 Physical Therapy Treatment Patient Details Name: Sherri [name]  Sherri [name] MRN: [id number] DOB: December 28, 1940 Today's Date: 11/14/2023   History of Present Illness Pt admitted with R hip fx and not s/p THR.  Pt with hx of fall with non-operative R elbow fx and hip fx not diagnosed until pt in SNF rehab ~ 1 month.  Pt taken home by family and to see PCP who did hip Xray and sent pt directly to [facility] for surgery.  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    PT Comments  Pt continues very cooperative and progressing with mobility including up to EOB sitting with no c/o dizziness, to standing and step pvt bed to chair with RW but with noted buckling at R LE with attempts to WB.  Pt up in chair and positioned for comfort.  BP supine 128/71; sit 111/63; and standing 104/62; 3 min stand deferred - pt denies symptoms but reports I need to sit 2* increased pain.    If plan is discharge home, recommend the following: A lot of help with walking and/or transfers;A lot of help with bathing/dressing/bathroom;Assistance with cooking/housework;Assist for transportation;Help with stairs or ramp for entrance   Can travel by private vehicle        Equipment Recommendations  None recommended by PT    Recommendations for Other Services OT consult     Precautions / Restrictions Precautions Precautions: Fall;Other (comment) Precaution/Restrictions Comments: Orthostatic but less so than on eval Restrictions Weight Bearing Restrictions Per Provider Order: No RLE Weight Bearing Per Provider Order: Weight bearing as tolerated Other Position/Activity Restrictions: Per pt and dtr, pt has been using R UE as tolerated since 2 days post nondisplaced fx May 9     Mobility  Bed Mobility Overal bed mobility: Needs Assistance Bed Mobility: Supine to Sit     Supine to sit: Min assist, Mod assist, +2 for physical assistance     General bed mobility comments: cues for sequence and  use of L LE to self assist.  Physical assist to manage R LE, to control trunk and to complete rotation with use of bedpad    Transfers Overall transfer level: Needs assistance Equipment used: Rolling walker (2 wheels) Transfers: Sit to/from Stand, Bed to chair/wheelchair/BSC Sit to Stand: Min assist, Mod assist, +2 physical assistance, +2 safety/equipment, From elevated surface   Step pivot transfers: Min assist, Mod assist, +2 physical assistance, +2 safety/equipment       General transfer comment: cues for LE management and use of UEs to self assist; Step pvt with RW bed to chair with noted buckling at R LE with attempts to [facility]    Ambulation/Gait               General Gait Details: bed to chair   Stairs             Wheelchair Mobility     Tilt Bed    Modified Rankin (Stroke Patients Only)       Balance Overall balance assessment: Needs assistance Sitting-balance support: Bilateral upper extremity supported, Feet supported Sitting balance-Leahy Scale: Fair     Standing balance support: Bilateral upper extremity supported Standing balance-Leahy Scale: Poor                              Communication Communication Communication: Impaired Factors Affecting Communication: Hearing impaired  Cognition Arousal: Alert Behavior During Therapy: [facility] for  tasks assessed/performed   PT - Cognitive impairments: No apparent impairments                         Following commands: Intact      Cueing Cueing Techniques: Verbal cues, Gestural cues  Exercises Total Joint Exercises Ankle Circles/Pumps: AROM, Both, 15 reps, Supine Quad Sets: AROM, Both, 10 reps, Supine Heel Slides: AAROM, Right, 20 reps, Supine Hip ABduction/ADduction: AAROM, Right, 15 reps, Supine    General Comments        Pertinent Vitals/Pain Pain Assessment Pain Assessment: No/denies pain Pain Score: 6  Pain Location: R hip/thigh with WB Pain Descriptors /  Indicators: Aching, Sore Pain Intervention(s): Limited activity within patient's tolerance, Premedicated before session, Monitored during session, Ice applied, Patient requesting pain meds-RN notified (premed with Robaxin  only)    Home Living                          Prior Function            PT Goals (current goals can now be found in the care plan section) Acute Rehab PT Goals Patient Stated Goal: Regain IND PT Goal Formulation: With patient Time For Goal Achievement: 11/20/23 Potential to Achieve Goals: Good Progress towards PT goals: Progressing toward goals    Frequency    7X/week      PT Plan      Co-evaluation PT/OT/SLP Co-Evaluation/Treatment: Yes Reason for Co-Treatment: For patient/therapist safety;To address functional/ADL transfers PT goals addressed during session: Mobility/safety with mobility OT goals addressed during session: ADL's and self-care      AM-PAC PT 6 Clicks Mobility   Outcome Measure  Help needed turning from your back to your side while in a flat bed without using bedrails?: A Little Help needed moving from lying on your back to sitting on the side of a flat bed without using bedrails?: A Lot Help needed moving to and from a bed to a chair (including a wheelchair)?: A Lot Help needed standing up from a chair using your arms (e.g., wheelchair or bedside chair)?: A Lot Help needed to walk in hospital room?: Total Help needed climbing 3-5 steps with a railing? : Total 6 Click Score: 11    End of Session Equipment Utilized During Treatment: Gait belt Activity Tolerance: Patient tolerated treatment well;Patient limited by pain Patient left: in chair;with call bell/phone within reach;with family/visitor present Nurse Communication: Mobility status PT Visit Diagnosis: Difficulty in walking, not elsewhere classified (R26.2);Muscle weakness (generalized) (M62.81)     Time: 8647-8585 PT Time Calculation (min) (ACUTE ONLY): 22  min  Charges:    $Therapeutic Exercise: 8-22 mins $Therapeutic Activity: 8-22 mins PT General Charges $$ ACUTE PT VISIT: 1 Visit                     Katrinka Acton PT Acute Rehabilitation Services Pager [phone] Office [phone]    Graceland Wachter 11/14/2023, 3:21 PM

## 2023-11-14 NOTE — Progress Notes (Addendum)
 [facility]  PROGRESS NOTE  Sherri [name]  A Wee FMW:[id number] DOB: [birth year] DOA: 11/10/2023 PCP: [name] PARAS, MD   Brief HPI:   83 y.o. female with medical history significant of anxiety, GERD, rheumatoid arthritis, hypertension, multiple sclerosis, lipidemia who presented to the emergency department due to hip pain.  Patient was admitted in May.  During this hospitalization she was treated for cellulitis and sepsis.  She had a mechanical fall has CT scan which showed no evidence of fracture.  She been having chronic pain of her left hip and difficulty walking.  She went to her primary care doctor who ordered outpatient imaging which showed impacted hip fracture.  She was referred to the emergency department for further assessment.     Assessment/Plan:    Right impacted hip fracture -Subacute displaced right femoral neck fracture, status post total hip arthroplasty -Orthopedics following Aspirin  81 mg p.o. twice daily for 45 days for DVT prophylaxis   Anemia Hemoglobin dropped to 6.5 this morning -1 unit PRBC ordered Follow H&H in a.m.  # Hypertension -continue carvedilol , Lasix  -Hold carvedilol  due to hypotension today   # Chronic pain -continue Cymbalta  As needed fentanyl    # Urinary retention -continue oxybutynin    # GERD -continue Protonix    # Rheumatoid arthritis -continue prednisone  -Patient was hypotensive yesterday, started on stress dose hydrocortisone  for 1 day only. -Blood pressure has improved   # Hyperlipidemia -continue statin   # Hypokalemia - Replete  #Hypotension - Resolved - Continue to hold Coreg   Hyponatremia, chronic - Sodium is improving; back to baseline   Medications     sodium chloride    Intravenous Once   aspirin   81 mg Oral BID   atorvastatin   10 mg Oral Daily   Chlorhexidine  Gluconate Cloth  6 each Topical Daily   docusate sodium   100 mg Oral Daily   DULoxetine   40 mg Oral QHS   feeding supplement  237 mL Oral BID BM    leptospermum manuka honey  1 Application Topical Daily   mupirocin  ointment  1 Application Nasal BID   oxybutynin   5 mg Oral Daily   pantoprazole   40 mg Oral BID AC   senna  1 tablet Oral BID     Data Reviewed:   CBG:  No results for input(s): GLUCAP in the last 168 hours.  SpO2: 98 % O2 Flow Rate (L/min): 2 L/min    Vitals:   11/13/23 1318 11/13/23 1848 11/13/23 2224 11/14/23 0618  BP: 109/63 109/66 109/66 (!) 119/59  Pulse: 85 88 92 85  Resp: 17 17 17 17   Temp: (!) 97.5 F (36.4 C) 98.5 F (36.9 C) 98.1 F (36.7 C) 97.8 F (36.6 C)  TempSrc:      SpO2: 100% 98% 99% 98%  Weight:      Height:          Data Reviewed:  Basic Metabolic Panel: Recent Labs  Lab 11/10/23 1810 11/12/23 0354 11/13/23 0335 11/14/23 0335  NA 129* 126* 131* 130*  K 3.5 3.2* 4.5 4.3  CL 87* 89* 95* 95*  CO2 29 28 28 26   GLUCOSE 170* 97 160* 137*  BUN 9 10 7* 11  CREATININE 0.79 0.60 0.56 0.75  CALCIUM  8.6* 7.8* 7.6* 7.8*    CBC: Recent Labs  Lab 11/10/23 1810 11/12/23 0354 11/14/23 0335 11/14/23 0720  WBC 7.3 9.4 12.7* 12.4*  HGB 11.0* 10.3* 6.5* 6.6*  HCT 33.4* 31.8* 20.6* 20.3*  MCV 106.7* 108.9* 109.6* 107.4*  PLT 315 263  248 276    LFT No results for input(s): AST, ALT, ALKPHOS, BILITOT, PROT, ALBUMIN  in the last 168 hours.   Antibiotics: Anti-infectives (From admission, onward)    Start     Dose/Rate Route Frequency Ordered Stop   11/13/23 0600  vancomycin  (VANCOCIN ) IVPB 1000 mg/200 mL premix        1,000 mg 200 mL/hr over 60 Minutes Intravenous On call to O.R. 11/12/23 1431 11/13/23 0641   11/12/23 2200  ceFAZolin  (ANCEF ) IVPB 2g/100 mL premix        2 g 200 mL/hr over 30 Minutes Intravenous Every 6 hours 11/12/23 2006 11/13/23 0430   11/12/23 2200  vancomycin  (VANCOCIN ) IVPB 1000 mg/200 mL premix        1,000 mg 200 mL/hr over 60 Minutes Intravenous  Once 11/12/23 2006 11/13/23 0040   11/12/23 1442  sodium chloride  0.9 % with cefTRIAXone   (ROCEPHIN ) ADS Med  Status:  Discontinued       Note to Pharmacy: Sherri [name] W: cabinet override      11/12/23 1442 11/12/23 1449        DVT prophylaxis: SCDs  Code Status: Full code  Family Communication: No family at bedside   CONSULTS orthopedics   Subjective   Hemoglobin dropped to 6.6 this morning.   Objective    Physical Examination:  General-appears in no acute distress Heart-S1-S2, regular, no murmur auscultated Lungs-clear to auscultation bilaterally, no wheezing or crackles auscultated Abdomen-soft, nontender, no organomegaly Extremities-no edema in the lower extremities Neuro-alert, oriented x3, no focal deficit noted  Status is: Inpatient:             Sherri [name]   [facility] If 7PM-7AM, please contact night-coverage at [email or web address], Office  [phone]   11/14/2023, 8:37 AM  LOS: 3 days

## 2023-11-15 ENCOUNTER — Inpatient Hospital Stay (HOSPITAL_COMMUNITY)

## 2023-11-15 DIAGNOSIS — S72001A Fracture of unspecified part of neck of right femur, initial encounter for closed fracture: Secondary | ICD-10-CM | POA: Diagnosis not present

## 2023-11-15 DIAGNOSIS — S72009A Fracture of unspecified part of neck of unspecified femur, initial encounter for closed fracture: Secondary | ICD-10-CM | POA: Diagnosis not present

## 2023-11-15 DIAGNOSIS — I959 Hypotension, unspecified: Secondary | ICD-10-CM | POA: Diagnosis not present

## 2023-11-15 LAB — BASIC METABOLIC PANEL WITH GFR
Anion gap: 9 (ref 5–15)
BUN: 11 mg/dL (ref 8–23)
CO2: 26 mmol/L (ref 22–32)
Calcium: 8.1 mg/dL — ABNORMAL LOW (ref 8.9–10.3)
Chloride: 97 mmol/L — ABNORMAL LOW (ref 98–111)
Creatinine, Ser: 0.69 mg/dL (ref 0.44–1.00)
GFR, Estimated: >60 mL/min (ref >60)
Glucose, Bld: 98 mg/dL (ref 70–99)
Potassium: 3.7 mmol/L (ref 3.5–5.1)
Sodium: 132 mmol/L — ABNORMAL LOW (ref 135–145)

## 2023-11-15 LAB — CBC
HCT: 27.9 % — ABNORMAL LOW (ref 36.0–46.0)
Hemoglobin: 9.2 g/dL — ABNORMAL LOW (ref 12.0–15.0)
MCH: 33.7 pg (ref 26.0–34.0)
MCHC: 33 g/dL (ref 30.0–36.0)
MCV: 102.2 fL — ABNORMAL HIGH (ref 80.0–100.0)
Platelets: 257 10*3/uL (ref 150–400)
RBC: 2.73 MIL/uL — ABNORMAL LOW (ref 3.87–5.11)
RDW: 17.6 % — ABNORMAL HIGH (ref 11.5–15.5)
WBC: 8.5 10*3/uL (ref 4.0–10.5)
nRBC: 0.2 % (ref 0.0–0.2)

## 2023-11-15 MED ORDER — HYDROCORTISONE SOD SUC (PF) 100 MG IJ SOLR
50.0000 mg | Freq: Once | INTRAMUSCULAR | Status: AC
  Administered 2023-11-15: 50 mg via INTRAVENOUS
  Filled 2023-11-15: qty 1

## 2023-11-15 MED ORDER — PREDNISONE 10 MG PO TABS
5.0000 mg | ORAL_TABLET | Freq: Every day | ORAL | Status: DC
  Administered 2023-11-16 – 2023-11-19 (×4): 5 mg via ORAL
  Filled 2023-11-15 (×4): qty 1

## 2023-11-15 NOTE — Plan of Care (Signed)
  Problem: Coping: Goal: Level of anxiety will decrease Outcome: Progressing   Problem: Pain Managment: Goal: General experience of comfort will improve and/or be controlled Outcome: Progressing   Problem: Safety: Goal: Ability to remain free from injury will improve Outcome: Progressing   Problem: Skin Integrity: Goal: Risk for impaired skin integrity will decrease Outcome: Progressing

## 2023-11-15 NOTE — Progress Notes (Signed)
 pt started to c/o right hip stating It feels like it's popping out. pt was unable to bear weight on right leg  with therapy during the second session. Right leg noticed to be shorter than the left leg. PA notified and received STAT x-ray ordered. Pt currently resting with call bell in reach. PRN pain med given for pain. Will continue to monitor.

## 2023-11-15 NOTE — Progress Notes (Addendum)
 Physical Therapy Treatment Patient Details Name: Sherri [name] MRN: [id number] DOB: [birth year] Today's Date: 11/15/2023   History of Present Illness Pt admitted with R hip fx and not s/p THR.  Pt with hx of fall with non-operative R elbow fx and hip fx not diagnosed until pt in SNF rehab ~ 1 month.  Pt taken home by family and to see PCP who did hip Xray and sent pt directly to [facility] for surgery.  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    PT Comments  Pt continues cooperative but requiring increased time for all tasks and progress ltd by pain and orthostatic hypotension with attempts at OOB activity.  Pt BP supine114/60; after move to sitting 91/60; sitting x ~ 2 min 111/69; and after move to standing and return to sitting with noted decreased processing speed 81/46. Pt assisted back to bed and RN alerted to issue.    If plan is discharge home, recommend the following: A lot of help with walking and/or transfers;A lot of help with bathing/dressing/bathroom;Assistance with cooking/housework;Assist for transportation;Help with stairs or ramp for entrance   Can travel by private vehicle        Equipment Recommendations  None recommended by PT    Recommendations for Other Services       Precautions / Restrictions Precautions Precautions: Fall;Other (comment) Precaution/Restrictions Comments: Watch BPs, has been orthostatic Restrictions Weight Bearing Restrictions Per Provider Order: No RLE Weight Bearing Per Provider Order: Weight bearing as tolerated Other Position/Activity Restrictions: per ortho PA note, patient is WBAT and ROM as tolerated RUE     Mobility  Bed Mobility Overal bed mobility: Needs Assistance Bed Mobility: Supine to Sit     Supine to sit: Min assist, Mod assist Sit to supine: Mod assist, +2 for physical assistance, +2 for safety/equipment   General bed mobility comments: cues for sequence and use of L LE to self  assist.  Physical assist to manage R LE, to control trunk and to complete rotation with use of bedpad.  INcreased assist to return 2* orthostatic    Transfers Overall transfer level: Needs assistance Equipment used: Rolling walker (2 wheels) Transfers: Sit to/from Stand Sit to Stand: Min assist, Mod assist, +2 safety/equipment, From elevated surface           General transfer comment: cues for LE management and use of UEs to self assist; physical assist to bring wt up and fwd and to balance in standing with RW    Ambulation/Gait         Gait velocity: decr     General Gait Details: Pt stood only, tolerating min wt on R LE, returned to sitting with c/o dizziness   Stairs             Wheelchair Mobility     Tilt Bed    Modified Rankin (Stroke Patients Only)       Balance Overall balance assessment: Needs assistance Sitting-balance support: Bilateral upper extremity supported, Feet supported Sitting balance-Leahy Scale: Fair     Standing balance support: Bilateral upper extremity supported Standing balance-Leahy Scale: Poor                              Communication Communication Communication: Impaired Factors Affecting Communication: Hearing impaired  Cognition Arousal: Alert Behavior During Therapy: WFL for tasks assessed/performed  Following commands: Intact      Cueing Cueing Techniques: Verbal cues, Gestural cues  Exercises      General Comments        Pertinent Vitals/Pain Pain Assessment Pain Assessment: 0-10 Pain Score: 6  Pain Location: R hip/thigh with WB Pain Descriptors / Indicators: Aching, Sore Pain Intervention(s): Limited activity within patient's tolerance, Monitored during session, Premedicated before session, Ice applied    Home Living                          Prior Function            PT Goals (current goals can now be found in the care plan section)  Acute Rehab PT Goals Patient Stated Goal: Regain IND PT Goal Formulation: With patient Time For Goal Achievement: 11/20/23 Potential to Achieve Goals: Good Progress towards PT goals: Not progressing toward goals - comment (orthostatic)    Frequency    7X/week      PT Plan      Co-evaluation              AM-PAC PT 6 Clicks Mobility   Outcome Measure  Help needed turning from your back to your side while in a flat bed without using bedrails?: A Little Help needed moving from lying on your back to sitting on the side of a flat bed without using bedrails?: A Lot Help needed moving to and from a bed to a chair (including a wheelchair)?: A Lot Help needed standing up from a chair using your arms (e.g., wheelchair or bedside chair)?: A Lot Help needed to walk in hospital room?: Total Help needed climbing 3-5 steps with a railing? : Total 6 Click Score: 11    End of Session Equipment Utilized During Treatment: Gait belt Activity Tolerance: Patient tolerated treatment well;Patient limited by pain;Patient limited by fatigue Patient left: in bed;with call bell/phone within reach;with bed alarm set;with family/visitor present Nurse Communication: Mobility status PT Visit Diagnosis: Difficulty in walking, not elsewhere classified (R26.2);Muscle weakness (generalized) (M62.81)     Time: 8996-8968 PT Time Calculation (min) (ACUTE ONLY): 28 min  Charges:    $Therapeutic Activity: 23-37 mins PT General Charges $$ ACUTE PT VISIT: 1 Visit                     Katrinka Acton PT Acute Rehabilitation Services Pager [phone] Office [phone]    [facility] 11/15/2023, 2:39 PM

## 2023-11-15 NOTE — Progress Notes (Addendum)
 Patient ID: Sherri [name], female   DOB: 1940/05/28, 83 y.o.   MRN: [id number] Made aware of dislocation of her right THR performed 6/19th  She had eaten dinner around 5:30  I am going to try and get her in the OR before the day starts to attempt a closed reduction  Orders placed, NPO after MN, consent

## 2023-11-15 NOTE — Progress Notes (Signed)
 [facility]  PROGRESS NOTE  Sherri [name]  A Landry FMW:[id number] DOB: 1940/09/11 DOA: 11/10/2023 PCP: [name] PARAS, MD   Brief HPI:   83 y.o. female with medical history significant of anxiety, GERD, rheumatoid arthritis, hypertension, multiple sclerosis, lipidemia who presented to the emergency department due to hip pain.  Patient was admitted in May.  During this hospitalization she was treated for cellulitis and sepsis.  She had a mechanical fall has CT scan which showed no evidence of fracture.  She been having chronic pain of her left hip and difficulty walking.  She went to her primary care doctor who ordered outpatient imaging which showed impacted hip fracture.  She was referred to the emergency department for further assessment.     Assessment/Plan:    Right impacted hip fracture -Subacute displaced right femoral neck fracture, status post total hip arthroplasty -Orthopedics following Aspirin  81 mg p.o. twice daily for 45 days for DVT prophylaxis   Anemia -Status post 1 unit PRBC, hemoglobin is up to 9.2 Hemoglobin dropped to 6.5 yesterday   # Hypertension -continue carvedilol , Lasix  -Hold carvedilol  due to hypotension today   # Chronic pain -continue Cymbalta  As needed fentanyl    # Urinary retention -continue oxybutynin    # GERD -continue Protonix    # Rheumatoid arthritis -continue prednisone  -Has orthostatic hypotension, prednisone  was not started after she was was given hydrocortisone  stress dose -Will restart prednisone  5 mg daily, give additional dose of hydrocortisone  50 mg IV - Continue to monitor patient's blood pressure   # Hyperlipidemia -continue statin   # Hypokalemia - Replete  #Hypotension - Resolved -Likely in setting of adrenal insufficiency in setting of surgery - Continue to hold Coreg  - Started on hydrocortisone  as above  Hyponatremia, chronic - Sodium is improving; back to baseline   Medications     aspirin   81 mg Oral BID    atorvastatin   10 mg Oral Daily   Chlorhexidine  Gluconate Cloth  6 each Topical Daily   docusate sodium   100 mg Oral Daily   DULoxetine   40 mg Oral QHS   feeding supplement  237 mL Oral BID BM   leptospermum manuka honey  1 Application Topical Daily   mupirocin  ointment  1 Application Nasal BID   oxybutynin   5 mg Oral Daily   pantoprazole   40 mg Oral BID AC   senna  1 tablet Oral BID     Data Reviewed:   CBG:  No results for input(s): GLUCAP in the last 168 hours.  SpO2: 96 % O2 Flow Rate (L/min): 2 L/min    Vitals:   11/14/23 1327 11/14/23 2141 11/15/23 0605 11/15/23 0817  BP: 128/82 126/73 119/67 (!) 145/77  Pulse: 88 92 90 86  Resp: 19 16 16 16   Temp: 98 F (36.7 C) 97.8 F (36.6 C) (!) 96.5 F (35.8 C)   TempSrc: Oral Oral    SpO2: 99% 91% 96% 96%  Weight:      Height:          Data Reviewed:  Basic Metabolic Panel: Recent Labs  Lab 11/10/23 1810 11/12/23 0354 11/13/23 0335 11/14/23 0335 11/15/23 0317  NA 129* 126* 131* 130* 132*  K 3.5 3.2* 4.5 4.3 3.7  CL 87* 89* 95* 95* 97*  CO2 29 28 28 26 26   GLUCOSE 170* 97 160* 137* 98  BUN 9 10 7* 11 11  CREATININE 0.79 0.60 0.56 0.75 0.69  CALCIUM  8.6* 7.8* 7.6* 7.8* 8.1*    CBC: Recent Labs  Lab  11/10/23 1810 11/12/23 0354 11/14/23 0335 11/14/23 0720 11/15/23 0317  WBC 7.3 9.4 12.7* 12.4* 8.5  HGB 11.0* 10.3* 6.5* 6.6* 9.2*  HCT 33.4* 31.8* 20.6* 20.3* 27.9*  MCV 106.7* 108.9* 109.6* 107.4* 102.2*  PLT 315 263 248 276 257    LFT No results for input(s): AST, ALT, ALKPHOS, BILITOT, PROT, ALBUMIN  in the last 168 hours.   Antibiotics: Anti-infectives (From admission, onward)    Start     Dose/Rate Route Frequency Ordered Stop   11/13/23 0600  vancomycin  (VANCOCIN ) IVPB 1000 mg/200 mL premix        1,000 mg 200 mL/hr over 60 Minutes Intravenous On call to O.R. 11/12/23 1431 11/13/23 0641   11/12/23 2200  ceFAZolin  (ANCEF ) IVPB 2g/100 mL premix        2 g 200 mL/hr over 30  Minutes Intravenous Every 6 hours 11/12/23 2006 11/13/23 0430   11/12/23 2200  vancomycin  (VANCOCIN ) IVPB 1000 mg/200 mL premix        1,000 mg 200 mL/hr over 60 Minutes Intravenous  Once 11/12/23 2006 11/13/23 0040   11/12/23 1442  sodium chloride  0.9 % with cefTRIAXone  (ROCEPHIN ) ADS Med  Status:  Discontinued       Note to Pharmacy: Sherri [name] W: cabinet override      11/12/23 1442 11/12/23 1449        DVT prophylaxis: SCDs  Code Status: Full code  Family Communication: No family at bedside   CONSULTS orthopedics   Subjective   Denies any complaints today  Objective    Physical Examination:  General-appears in no acute distress Heart-S1-S2, regular, no murmur auscultated Lungs-clear to auscultation bilaterally, no wheezing or crackles auscultated Abdomen-soft, nontender, no organomegaly Extremities-no edema in the lower extremities Neuro-alert, oriented x3, no focal deficit noted  Status is: Inpatient:             Sherri [name]   [facility] If 7PM-7AM, please contact night-coverage at [email or web address], Office  [phone]   11/15/2023, 8:22 AM  LOS: 4 days

## 2023-11-15 NOTE — Progress Notes (Signed)
 PA and MD aware about the x-ray result. Plan to have surgery tomorrow. Pt is on bedrest for now and will be NPO after midnight. Daughter Ms. Suzen Bruns notified. Daughter was thankful for the update and no concerns made at this time. Report passed to the night shift nurse. Care taker at bedside. Call bell in reach.

## 2023-11-15 NOTE — Progress Notes (Addendum)
 Physical Therapy Treatment Patient Details Name: Sherri  DAMISHA [name] MRN: [id number] DOB: 1940-11-02 Today's Date: 11/15/2023   History of Present Illness Pt admitted with R hip fx and not s/p THR.  Pt with hx of fall with non-operative R elbow fx and hip fx not diagnosed until pt in SNF rehab ~ 1 month.  Pt taken home by family and to see PCP who did hip Xray and sent pt directly to [facility] for surgery.  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    PT Comments  Pt continues motivated and on arrival to room requesting OOB and that bed is wet (purewick malfunction).  Pt assisted to EOB sitting with decreased assist and noted to actively be moving R LE.  Pt assisted to standing but with limited WB tolerance on R LE in standing with RW and states I can't reach the floor with it.  Pt returned to sitting and performed stand/pvt on L LE bed to chair to remove pt from soiled bed for hygiene up in chair.  Pt with min pain once settled in recliner but question increased shortening of R LE vs L LE.  Pt remarks it has been cracking when she has been shifting in bed.  RN aware. BP supine 127/89; sitting 114/76 with c/o mild dizziness; sit ~ 2 min 131/88; after stand pvt to recliner 111/71.  Rn aware.   If plan is discharge home, recommend the following: A lot of help with walking and/or transfers;A lot of help with bathing/dressing/bathroom;Assistance with cooking/housework;Assist for transportation;Help with stairs or ramp for entrance   Can travel by private vehicle        Equipment Recommendations  None recommended by PT    Recommendations for Other Services OT consult     Precautions / Restrictions Precautions Precautions: Fall;Other (comment) Precaution/Restrictions Comments: Watch BPs, has been orthostatic Restrictions Weight Bearing Restrictions Per Provider Order: No RLE Weight Bearing Per Provider Order: Weight bearing as tolerated Other  Position/Activity Restrictions: per ortho PA note, patient is WBAT and ROM as tolerated RUE     Mobility  Bed Mobility Overal bed mobility: Needs Assistance Bed Mobility: Supine to Sit Rolling: Min assist   Supine to sit: Min assist, Mod assist Sit to supine: Mod assist, +2 for physical assistance, +2 for safety/equipment   General bed mobility comments: cues for sequence and use of L LE to self assist.  Min assist to manage R LE, to control trunk and to complete rotation with use of bedpad.  INcreased assist to return 2* orthostatic    Transfers Overall transfer level: Needs assistance Equipment used: Rolling walker (2 wheels) Transfers: Sit to/from Stand, Bed to chair/wheelchair/BSC Sit to Stand: Min assist, Mod assist, +2 safety/equipment, From elevated surface Stand pivot transfers: Mod assist, From elevated surface         General transfer comment: cues for LE management and use of UEs to self assist; physical assist to bring wt up and fwd and to balance in standing with RW; returned to sitting and performed stand pvt bed to chair    Ambulation/Gait         Gait velocity: decr     General Gait Details: Stand pvt to recliner only   Stairs             Wheelchair Mobility     Tilt Bed    Modified Rankin (Stroke Patients Only)       Balance Overall  balance assessment: Needs assistance Sitting-balance support: Feet supported, No upper extremity supported Sitting balance-Leahy Scale: Fair     Standing balance support: Bilateral upper extremity supported Standing balance-Leahy Scale: Poor                              Communication Communication Communication: Impaired Factors Affecting Communication: Hearing impaired  Cognition Arousal: Alert Behavior During Therapy: WFL for tasks assessed/performed                             Following commands: Intact      Cueing Cueing Techniques: Verbal cues, Gestural cues   Exercises      General Comments        Pertinent Vitals/Pain Pain Assessment Pain Assessment: 0-10 Pain Score: 6  Pain Location: R hip/thigh with WB attempt Pain Descriptors / Indicators: Aching, Sore Pain Intervention(s): Limited activity within patient's tolerance, Monitored during session, Premedicated before session    Home Living                          Prior Function            PT Goals (current goals can now be found in the care plan section) Acute Rehab PT Goals Patient Stated Goal: Regain IND PT Goal Formulation: With patient Time For Goal Achievement: 11/20/23 Potential to Achieve Goals: Good Progress towards PT goals: Not progressing toward goals - comment (orthostatic, increased pain)    Frequency    7X/week      PT Plan      Co-evaluation              AM-PAC PT 6 Clicks Mobility   Outcome Measure  Help needed turning from your back to your side while in a flat bed without using bedrails?: A Little Help needed moving from lying on your back to sitting on the side of a flat bed without using bedrails?: A Lot Help needed moving to and from a bed to a chair (including a wheelchair)?: A Lot Help needed standing up from a chair using your arms (e.g., wheelchair or bedside chair)?: A Lot Help needed to walk in hospital room?: Total Help needed climbing 3-5 steps with a railing? : Total 6 Click Score: 11    End of Session Equipment Utilized During Treatment: Gait belt Activity Tolerance: Patient limited by pain;Other (comment) (orthostatic) Patient left: in chair;with call bell/phone within reach;with chair alarm set Nurse Communication: Mobility status PT Visit Diagnosis: Difficulty in walking, not elsewhere classified (R26.2);Muscle weakness (generalized) (M62.81)     Time: 8444-8364 PT Time Calculation (min) (ACUTE ONLY): 40 min  Charges:    $Therapeutic Activity: 23-37 mins PT General Charges $$ ACUTE PT VISIT: 1  Visit                     Katrinka Acton PT Acute Rehabilitation Services Pager [phone] Office [phone]    [facility] 11/15/2023, 5:38 PM

## 2023-11-15 NOTE — Progress Notes (Signed)
 Subjective: 3 Days Post-Op Procedure(s) (LRB): ARTHROPLASTY, HIP, TOTAL, ANTERIOR APPROACH (Right) Patient reports pain as mild.   Patient seen in rounds for Dr. Fidel. Patient is resting in bed on exam this morning. Caretaker at the bedside. She reports she is feeling confused today. She thought she had been moved to a new room, but realizes she had not.  We will start therapy today.   Objective: Vital signs in last 24 hours: Temp:  [96.5 F (35.8 C)-98.1 F (36.7 C)] 96.5 F (35.8 C) (06/22 0605) Pulse Rate:  [86-97] 86 (06/22 0817) Resp:  [16-19] 16 (06/22 0817) BP: (104-145)/(63-88) 145/77 (06/22 0817) SpO2:  [91 %-99 %] 96 % (06/22 0817)  Intake/Output from previous day:  Intake/Output Summary (Last 24 hours) at 11/15/2023 0827 Last data filed at 11/15/2023 0615 Gross per 24 hour  Intake 1592.86 ml  Output 0 ml  Net 1592.86 ml     Intake/Output this shift: No intake/output data recorded.  Labs: Recent Labs    11/14/23 0335 11/14/23 0720 11/15/23 0317  HGB 6.5* 6.6* 9.2*   Recent Labs    11/14/23 0720 11/15/23 0317  WBC 12.4* 8.5  RBC 1.89* 2.73*  HCT 20.3* 27.9*  PLT 276 257   Recent Labs    11/14/23 0335 11/15/23 0317  NA 130* 132*  K 4.3 3.7  CL 95* 97*  CO2 26 26  BUN 11 11  CREATININE 0.75 0.69  GLUCOSE 137* 98  CALCIUM  7.8* 8.1*   No results for input(s): LABPT, INR in the last 72 hours.  Exam: General - Patient is Alert and Appropriate Extremity - Neurologically intact Sensation intact distally Intact pulses distally Dorsiflexion/Plantar flexion intact Dressing - Dressing with scant drainage, no concerns Motor Function - intact, moving foot and toes well on exam.   Past Medical History:  Diagnosis Date   Anxiety    takes Valium  daily as needed   Basal cell carcinoma 01/21/1988   left nostril (MOHS), sup-right calf (CX35FU)   Basal cell carcinoma 03/22/1996   right calf (CX35FU)   Basal cell carcinoma 03/31/1995    left wing nose   Bruises easily    d/t meds   Cancer (HCC)    basal cell ca, in situ- uterine    Cataracts, bilateral    removed bilateral   Chronic back pain    Dizziness    r/t to meds   GERD (gastroesophageal reflux disease)    no meds on a regular basis but will take Tums if needed   Headache(784.0)    r/t neck issues   History of bronchitis 6-29yrs ago   History of colon polyps    benign   HOH (hard of hearing)    wears hearing aids   Hyperlipidemia    takes Atorvastatin  on Mondays and Fridays   Hypertension    Joint pain    Joint swelling    Multiple sclerosis (HCC)    Neuromuscular disorder (HCC)    Dr. FABIENE [facility]- [facility], follows [facility]   Nocturia    PONV (postoperative nausea and vomiting)    trouble urinating after surgery in 2014   Postoperative nausea and vomiting 01/11/2019   Rheumatoid arthritis (HCC)    Dr Lorrayne weekly, RA- hands- knees- feet    Rheumatoid arthritis(714.0)    Dr Dolphus takes Xeljanz  daily   Right wrist fracture    Skin cancer    Squamous cell carcinoma of skin 11/02/2001   in situ-right knee (cx58fu)   Squamous  cell carcinoma of skin 11/12/2011   in situ-left forearm (CX35FU), in situ-left foot (CX35FU)   Squamous cell carcinoma of skin 01/22/2012   in situ-left lower forearm (txpbx)   Squamous cell carcinoma of skin 11/17/2012   right shin (txpbx)   Squamous cell carcinoma of skin 10/28/2013   in situ-right shoulder (CX35FU)   Squamous cell carcinoma of skin 04/28/2014   well diff-right shin (txpbx)   Squamous cell carcinoma of skin 11/02/2014   in situ-Left shin (txpbx)   Squamous cell carcinoma of skin 12/15/2014   in situ-Left hand (txpbx), bowens-left side chest (txpbx)   Squamous cell carcinoma of skin 05/09/2015   in situ-left hand (txpbx)    Squamous cell carcinoma of skin 05/07/2016   in situ-left inner shin,ant, in situ-left inner shin, post, in situ-left outer forearm, in situ-right knuckle    Squamous cell carcinoma of skin 03/1302018   in situ-left shoulder (txpbx), in situ-right inner shin (txpbx), in situ-top of left foot (txpbx), KA- left forearm (txpbx)   Squamous cell carcinoma of skin 12/02/2016   in situ-left inner shin (txpbx)   Squamous cell carcinoma of skin 03/04/2017   in situ-left outer sup, shin (txpbx), in situ- right 2nd knuckle finger (txpbx), in situ- Left wrist (txpbx)   Squamous cell carcinoma of skin 04/06/2018   in situ-above left knee inner (txpbx)   Squamous cell carcinoma of skin 04/21/2018   in situ-right top hand (txpbx)   Squamous cell carcinoma of skin 07/08/2018   in situ-right lower inner shin (txpbx)   Squamous cell carcinoma of skin 04/27/2019   in situ- Left neck(CX35FU), In situ- right neck (CX35FU)   Urinary retention    sees Dr.Wrenn about 2 times a yr    Assessment/Plan: 3 Days Post-Op Procedure(s) (LRB): ARTHROPLASTY, HIP, TOTAL, ANTERIOR APPROACH (Right) Principal Problem:   Hip fracture (HCC)  Estimated body mass index is 20.24 kg/m as calculated from the following:   Height as of this encounter: 5' 1 (1.549 m).   Weight as of this encounter: 48.6 kg. Advance diet Up with therapy  DVT Prophylaxis - Aspirin  Weight bearing as tolerated.  Hgb 9.2 today after 2 units PRBC  Up with PT as able  Limit narcotics if possible as likely contributing to her confusion  Rosina Calin, PA-C Orthopedic Surgery [phone] 11/15/2023, 8:27 AM

## 2023-11-15 NOTE — Progress Notes (Signed)
 1030- PT notified the nurse about orthostatic hypotension while attempting activities. BP laying 114/60, sitting 91/60, sitting 2 min 118/69, standing 81/44.  Pt alert and oriented *3. While resting BP 126/66, HR 85. Pt refuses dizziness or discomfort while laying down. MD notified. Started on NS 0.9 at 75ml/hr. New order of Solu-cortef  iv administered. PRN pain med effective for pain in surgical site. Wound dressing changed to right elbow. Pt turned and repositioned. Care taker at bedside. Call bell in reach. Will continue to monitor.

## 2023-11-15 NOTE — Plan of Care (Signed)
   Problem: Coping: Goal: Level of anxiety will decrease Outcome: Progressing   Problem: Pain Managment: Goal: General experience of comfort will improve and/or be controlled Outcome: Progressing   Problem: Safety: Goal: Ability to remain free from injury will improve Outcome: Progressing

## 2023-11-16 ENCOUNTER — Inpatient Hospital Stay (HOSPITAL_COMMUNITY): Admitting: Anesthesiology

## 2023-11-16 ENCOUNTER — Inpatient Hospital Stay (HOSPITAL_COMMUNITY)

## 2023-11-16 ENCOUNTER — Encounter (HOSPITAL_COMMUNITY)
Admission: EM | Disposition: A | Discharge status: Home Health | Payer: Self-pay | Source: Home / Self Care | Attending: Family Medicine

## 2023-11-16 ENCOUNTER — Ambulatory Visit: Admitting: Neurology

## 2023-11-16 ENCOUNTER — Encounter (HOSPITAL_COMMUNITY): Payer: Self-pay | Admitting: Family Medicine

## 2023-11-16 ENCOUNTER — Encounter: Payer: Self-pay | Admitting: Neurology

## 2023-11-16 DIAGNOSIS — E785 Hyperlipidemia, unspecified: Secondary | ICD-10-CM | POA: Diagnosis not present

## 2023-11-16 DIAGNOSIS — S72009A Fracture of unspecified part of neck of unspecified femur, initial encounter for closed fracture: Secondary | ICD-10-CM | POA: Diagnosis not present

## 2023-11-16 DIAGNOSIS — T84020A Dislocation of internal right hip prosthesis, initial encounter: Secondary | ICD-10-CM | POA: Diagnosis not present

## 2023-11-16 DIAGNOSIS — I10 Essential (primary) hypertension: Secondary | ICD-10-CM | POA: Diagnosis not present

## 2023-11-16 DIAGNOSIS — S72001A Fracture of unspecified part of neck of right femur, initial encounter for closed fracture: Secondary | ICD-10-CM | POA: Diagnosis not present

## 2023-11-16 DIAGNOSIS — I959 Hypotension, unspecified: Secondary | ICD-10-CM | POA: Diagnosis not present

## 2023-11-16 DIAGNOSIS — Z87891 Personal history of nicotine dependence: Secondary | ICD-10-CM | POA: Diagnosis not present

## 2023-11-16 HISTORY — PX: HIP CLOSED REDUCTION: SHX983

## 2023-11-16 LAB — TYPE AND SCREEN
ABO/RH(D): A POS
Antibody Screen: NEGATIVE
Unit division: 0

## 2023-11-16 LAB — BPAM RBC
Blood Product Expiration Date: 202507182359
ISSUE DATE / TIME: 202506210903
Unit Type and Rh: 6200

## 2023-11-16 SURGERY — CLOSED MANIPULATION, JOINT, HIP
Anesthesia: General | Site: Hip | Laterality: Right

## 2023-11-16 MED ORDER — CEFAZOLIN SODIUM-DEXTROSE 2-4 GM/100ML-% IV SOLN
INTRAVENOUS | Status: AC
  Filled 2023-11-16: qty 100

## 2023-11-16 MED ORDER — PROPOFOL 10 MG/ML IV BOLUS
INTRAVENOUS | Status: AC
  Filled 2023-11-16: qty 20

## 2023-11-16 MED ORDER — FENTANYL CITRATE PF 50 MCG/ML IJ SOSY
PREFILLED_SYRINGE | INTRAMUSCULAR | Status: AC
Start: 2023-11-16 — End: 2023-11-16
  Filled 2023-11-16: qty 1

## 2023-11-16 MED ORDER — CHLORHEXIDINE GLUCONATE 0.12 % MT SOLN
15.0000 mL | Freq: Once | OROMUCOSAL | Status: AC
  Administered 2023-11-16: 15 mL via OROMUCOSAL

## 2023-11-16 MED ORDER — LACTATED RINGERS IV SOLN: INTRAVENOUS | Status: DC

## 2023-11-16 MED ORDER — HYDROCORTISONE SOD SUC (PF) 100 MG IJ SOLR
25.0000 mg | Freq: Once | INTRAMUSCULAR | Status: AC
  Administered 2023-11-16: 25 mg via INTRAVENOUS
  Filled 2023-11-16: qty 0.5

## 2023-11-16 MED ORDER — LIDOCAINE HCL (CARDIAC) PF 100 MG/5ML IV SOSY
PREFILLED_SYRINGE | INTRAVENOUS | Status: DC | PRN
  Administered 2023-11-16: 60 mg via INTRAVENOUS

## 2023-11-16 MED ORDER — FENTANYL CITRATE PF 50 MCG/ML IJ SOSY
25.0000 ug | PREFILLED_SYRINGE | INTRAMUSCULAR | Status: DC | PRN
  Administered 2023-11-16: 50 ug via INTRAVENOUS

## 2023-11-16 MED ORDER — ORAL CARE MOUTH RINSE
15.0000 mL | OROMUCOSAL | Status: DC | PRN
Start: 2023-11-16 — End: 2023-11-20

## 2023-11-16 MED ORDER — PROPOFOL 10 MG/ML IV BOLUS
INTRAVENOUS | Status: DC | PRN
Start: 2023-11-16 — End: 2023-11-16
  Administered 2023-11-16: 80 mg via INTRAVENOUS

## 2023-11-16 SURGICAL SUPPLY — 22 items
BAG COUNTER SPONGE SURGICOUNT (BAG) IMPLANT
BLADE SAW SGTL 11.0X1.19X90.0M (BLADE) IMPLANT
BNDG ADH 1X3 SHEER STRL LF (GAUZE/BANDAGES/DRESSINGS) IMPLANT
COVER SURGICAL LIGHT HANDLE (MISCELLANEOUS) ×2 IMPLANT
DRESSING MEPILEX FLEX 4X4 (GAUZE/BANDAGES/DRESSINGS) IMPLANT
DRSG TEGADERM 2-3/8X2-3/4 SM (GAUZE/BANDAGES/DRESSINGS) IMPLANT
DURAPREP 26ML APPLICATOR (WOUND CARE) ×2 IMPLANT
GAUZE SPONGE 4X4 12PLY STRL (GAUZE/BANDAGES/DRESSINGS) IMPLANT
GLOVE BIOGEL M 7.0 STRL (GLOVE) IMPLANT
GLOVE BIOGEL PI IND STRL 7.5 (GLOVE) ×6 IMPLANT
GLOVE BIOGEL PI IND STRL 8.5 (GLOVE) ×2 IMPLANT
GLOVE ECLIPSE 8.0 STRL XLNG CF (GLOVE) IMPLANT
GLOVE INDICATOR 6.5 STRL GRN (GLOVE) ×2 IMPLANT
GLOVE SURG ORTHO 8.0 STRL STRW (GLOVE) ×2 IMPLANT
GOWN STRL REUS W/ TWL LRG LVL3 (GOWN DISPOSABLE) ×4 IMPLANT
IMMOBILIZER KNEE 16 UNIV (MISCELLANEOUS) IMPLANT
KIT TURNOVER KIT A (KITS) ×2 IMPLANT
MANIFOLD NEPTUNE II (INSTRUMENTS) ×2 IMPLANT
NDL SAFETY ECLIPSE 18X1.5 (NEEDLE) IMPLANT
PROTECTOR NERVE ULNAR (MISCELLANEOUS) ×2 IMPLANT
SYR CONTROL 10ML LL (SYRINGE) IMPLANT
TOWEL OR 17X26 10 PK STRL BLUE (TOWEL DISPOSABLE) ×4 IMPLANT

## 2023-11-16 NOTE — Op Note (Signed)
 NAME: Sherri [name], Sherri  A. MEDICAL RECORD NO: [id number] ACCOUNT NO: [id number] DATE OF BIRTH: 1941-04-14 FACILITY: [facility] LOCATION: [facility] PHYSICIAN: Donnice BIRCH. Ernie, MD  Operative Report   DATE OF PROCEDURE: 11/16/2023  PREOPERATIVE DIAGNOSIS:  Dislocated right total hip arthroplasty.  POSTOPERATIVE DIAGNOSIS:  Dislocated right total hip arthroplasty.  PROCEDURE:  Closed reduction of right total hip replacement.  SURGEON:  Donnice BIRCH. Ernie, MD  ASSISTANT:  Surgical team.  ANESTHESIA:  IV sedation, MAC.  COMPLICATIONS:  None.  Postprocedure radiograph reveals reduced right total hip replacement.  INDICATIONS FOR THE PROCEDURE:  The patient is an 83 year old female who had a ground-level fall.  She underwent a right total hip replacement for her femoral neck fracture on 11/12/2023.  On 11/15/2023, it was noted that her right lower extremity was  shortened and significantly internally rotated.  Radiographs confirmed dislocated right total hip.  Based on consumption of food and timing, the plan was for the patient to go to the operating room on the morning of 11/16/2023, for closed reduction.  The  plan was reviewed with her daughter based on history of dementia.  We discussed closed versus open reduction.  Consent was obtained.  DESCRIPTION OF PROCEDURE:  The patient was brought to the operative theater.  Once adequate anesthesia was established, a timeout was performed, identifying the patient, planned procedure, and extremity.  Based on her surgical approach, I first attempted  to reduce it through general traction, external, then internal rotation.  I was unable to get it reduced in that manner, so I did end up flexing her hip and with flexed traction, internal rotation, I was able to reduce the hip.  The hip was palpably and  audibly reduced.  We then had  plain radiographs to confirm the reduction of the hip concentrically within the acetabulum.  At this point, the procedure was  concluded.  She was noted to have some thin skin, and during the procedure, there were a couple  of mild skin tears that we dressed.  I then placed her into a knee immobilizer.  She was then taken to the recovery room in stable condition.  Procedure results were reviewed with her daughter.    SHW D: 11/16/2023 7:42:47 am T: 11/16/2023 8:00:00 am  JOB: [id number]/ [id number]

## 2023-11-16 NOTE — Anesthesia Preprocedure Evaluation (Addendum)
 Anesthesia Evaluation  Patient identified by MRN, date of birth, ID band Patient awake    Reviewed: Allergy & Precautions, H&P , NPO status , Patient's Chart, lab work & pertinent test results, reviewed documented beta blocker date and time   History of Anesthesia Complications (+) PONV and history of anesthetic complications  Airway Mallampati: II  TM Distance: >3 FB Neck ROM: Full    Dental no notable dental hx. (+) Teeth Intact, Dental Advisory Given   Pulmonary former smoker   Pulmonary exam normal breath sounds clear to auscultation       Cardiovascular hypertension, Pt. on medications and Pt. on home beta blockers + Peripheral Vascular Disease   Rhythm:Regular Rate:Normal     Neuro/Psych  Headaches  Anxiety        GI/Hepatic Neg liver ROS,GERD  Medicated,,  Endo/Other  negative endocrine ROS    Renal/GU negative Renal ROS  negative genitourinary   Musculoskeletal  (+) Arthritis , Rheumatoid disorders,    Abdominal   Peds  Hematology negative hematology ROS (+)   Anesthesia Other Findings   Reproductive/Obstetrics negative OB ROS                             Anesthesia Physical Anesthesia Plan  ASA: 3  Anesthesia Plan: General   Post-op Pain Management: Minimal or no pain anticipated   Induction: Intravenous, Rapid sequence and Cricoid pressure planned  PONV Risk Score and Plan: 4 or greater and Dexamethasone , Ondansetron  and Treatment may vary due to age or medical condition  Airway Management Planned: Oral ETT  Additional Equipment:   Intra-op Plan:   Post-operative Plan: Extubation in OR  Informed Consent: I have reviewed the patients History and Physical, chart, labs and discussed the procedure including the risks, benefits and alternatives for the proposed anesthesia with the patient or authorized representative who has indicated his/her understanding and  acceptance.     Dental advisory given  Plan Discussed with: CRNA  Anesthesia Plan Comments:        Anesthesia Quick Evaluation

## 2023-11-16 NOTE — Progress Notes (Signed)
 PT Cancellation Note  Patient Details Name: Sherri [name] MRN: [id number] DOB: [birth year]   Cancelled Treatment:    Reason Eval/Treat Not Completed: Other (comment). Pt underwent closed reduction this morning, will await updated orders from ortho MD prior to continuing PT.    [facility] 11/16/2023, 9:54 AM

## 2023-11-16 NOTE — Plan of Care (Signed)
   Problem: Coping: Goal: Level of anxiety will decrease Outcome: Progressing   Problem: Pain Managment: Goal: General experience of comfort will improve and/or be controlled Outcome: Progressing   Problem: Safety: Goal: Ability to remain free from injury will improve Outcome: Progressing

## 2023-11-16 NOTE — Interval H&P Note (Signed)
 History and Physical Interval Note:  11/16/2023 7:07 AM  Sherri  [name]  has presented today for surgery, with the diagnosis of Right dislocated hip.  The various methods of treatment have been discussed with the patient and family. After consideration of risks, benefits and other options for treatment, the patient has consented to  Procedure(s): CLOSED MANIPULATION, JOINT, HIP (Right) as a surgical intervention.  The patient's history has been reviewed, patient examined, no change in status, stable for surgery.  I have reviewed the patient's chart and labs.  Questions were answered to the patient's satisfaction.     [name]

## 2023-11-16 NOTE — Progress Notes (Signed)
 [facility]  PROGRESS NOTE  Sherri [name]  A Speedy FMW:[id number] DOB: May 27, 1940 DOA: 11/10/2023 PCP: [name] PARAS, MD   Brief HPI:   83 y.o. female with medical history significant of anxiety, GERD, rheumatoid arthritis, hypertension, multiple sclerosis, lipidemia who presented to the emergency department due to hip pain.  Patient was admitted in May.  During this hospitalization she was treated for cellulitis and sepsis.  She had a mechanical fall has CT scan which showed no evidence of fracture.  She been having chronic pain of her left hip and difficulty walking.  She went to her primary care doctor who ordered outpatient imaging which showed impacted hip fracture.  She was referred to the emergency department for further assessment.     Assessment/Plan:    Right impacted hip fracture -Subacute displaced right femoral neck fracture, status post total hip arthroplasty Aspirin  81 mg p.o. twice daily for 45 days for DVT prophylaxis  Dislocation of total right hip arthroplasty; found on 11/15/2023 -Underwent closed reduction of total hip replacement per orthopedics   Anemia -Status post 1 unit PRBC, hemoglobin is up to 9.2 Hemoglobin dropped to 6.5 yesterday   # Hypertension -continue Lasix  -Hold carvedilol  due to hypotension today   # Chronic pain -continue Cymbalta  As needed fentanyl    # Urinary retention -continue oxybutynin    # GERD -continue Protonix    # Rheumatoid arthritis -continue prednisone  -Has orthostatic hypotension, prednisone  was not started after she was was given hydrocortisone  stress dose -Will restart prednisone  5 mg daily, was given additional dose of hydrocortisone  50 mg IV.   - Continue to monitor patient's blood pressure   # Hyperlipidemia -continue statin   # Hypokalemia - Replete  #Hypotension - Resolved -Likely in setting of adrenal insufficiency in setting of surgery - Continue to hold Coreg  - Continue prednisone  5 mg daily Will give  1 dose of hydrocortisone  25 mg IV  Hyponatremia, chronic - Sodium is improving; back to baseline   Medications     [MAR Hold] aspirin   81 mg Oral BID   [MAR Hold] atorvastatin   10 mg Oral Daily   [MAR Hold] docusate sodium   100 mg Oral Daily   [MAR Hold] DULoxetine   40 mg Oral QHS   [MAR Hold] feeding supplement  237 mL Oral BID BM   [MAR Hold] leptospermum manuka honey  1 Application Topical Daily   [MAR Hold] mupirocin  ointment  1 Application Nasal BID   [MAR Hold] oxybutynin   5 mg Oral Daily   [MAR Hold] pantoprazole   40 mg Oral BID AC   [MAR Hold] predniSONE   5 mg Oral Q breakfast   [MAR Hold] senna  1 tablet Oral BID     Data Reviewed:   CBG:  No results for input(s): GLUCAP in the last 168 hours.  SpO2: 98 % O2 Flow Rate (L/min): 2 L/min    Vitals:   11/15/23 1335 11/15/23 2129 11/16/23 0530 11/16/23 0613  BP: 114/68 126/71 137/74   Pulse: 96 88 89   Resp: 18 17 17    Temp: 98.4 F (36.9 C) 98.1 F (36.7 C) 98.4 F (36.9 C)   TempSrc: Oral Oral Oral   SpO2: 98% 100% 98%   Weight:    48.6 kg  Height:    5' 1 (1.549 m)      Data Reviewed:  Basic Metabolic Panel: Recent Labs  Lab 11/10/23 1810 11/12/23 0354 11/13/23 0335 11/14/23 0335 11/15/23 0317  NA 129* 126* 131* 130* 132*  K 3.5 3.2* 4.5 4.3  3.7  CL 87* 89* 95* 95* 97*  CO2 29 28 28 26 26   GLUCOSE 170* 97 160* 137* 98  BUN 9 10 7* 11 11  CREATININE 0.79 0.60 0.56 0.75 0.69  CALCIUM  8.6* 7.8* 7.6* 7.8* 8.1*    CBC: Recent Labs  Lab 11/10/23 1810 11/12/23 0354 11/14/23 0335 11/14/23 0720 11/15/23 0317  WBC 7.3 9.4 12.7* 12.4* 8.5  HGB 11.0* 10.3* 6.5* 6.6* 9.2*  HCT 33.4* 31.8* 20.6* 20.3* 27.9*  MCV 106.7* 108.9* 109.6* 107.4* 102.2*  PLT 315 263 248 276 257    LFT No results for input(s): AST, ALT, ALKPHOS, BILITOT, PROT, ALBUMIN  in the last 168 hours.   Antibiotics: Anti-infectives (From admission, onward)    Start     Dose/Rate Route Frequency Ordered  Stop   11/16/23 0612  ceFAZolin  (ANCEF ) 2-4 GM/100ML-% IVPB       Note to Pharmacy: Sherri [name] M: cabinet override      11/16/23 0612 11/16/23 1829   11/13/23 0600  vancomycin  (VANCOCIN ) IVPB 1000 mg/200 mL premix        1,000 mg 200 mL/hr over 60 Minutes Intravenous On call to O.R. 11/12/23 1431 11/13/23 0641   11/12/23 2200  ceFAZolin  (ANCEF ) IVPB 2g/100 mL premix        2 g 200 mL/hr over 30 Minutes Intravenous Every 6 hours 11/12/23 2006 11/13/23 0430   11/12/23 2200  vancomycin  (VANCOCIN ) IVPB 1000 mg/200 mL premix        1,000 mg 200 mL/hr over 60 Minutes Intravenous  Once 11/12/23 2006 11/13/23 0040   11/12/23 1442  sodium chloride  0.9 % with cefTRIAXone  (ROCEPHIN ) ADS Med  Status:  Discontinued       Note to Pharmacy: Sherri [name] W: cabinet override      11/12/23 1442 11/12/23 1449        DVT prophylaxis: SCDs  Code Status: Full code  Family Communication: No family at bedside   CONSULTS orthopedics   Subjective   Patient was noted to have shortening of her right lower extremity.  X-ray obtained showed right hip dislocation  Objective    Physical Examination:  General-appears in no acute distress Heart-S1-S2, regular, no murmur auscultated Lungs-clear to auscultation bilaterally, no wheezing or crackles auscultated Abdomen-soft, nontender, no organomegaly Extremities-no edema in the lower extremities Neuro-alert, oriented x3, no focal deficit noted  Status is: Inpatient:             Sherri [name]   [facility] If 7PM-7AM, please contact night-coverage at [email or web address], Office  [phone]   11/16/2023, 7:55 AM  LOS: 5 days

## 2023-11-16 NOTE — Brief Op Note (Signed)
 11/16/2023  7:37 AM  PATIENT:  Sherri [name]  83 y.o. female  PRE-OPERATIVE DIAGNOSIS: Dislocation of right total hip arthroplasty  POST-OPERATIVE DIAGNOSIS: Dislocation of right total hip arthroplasty  PROCEDURE:  Procedure(s): CLOSED MANIPULATION, JOINT, HIP (Right)  SURGEON:  Surgeons and Role:    * Ernie Cough, MD - Primary  PHYSICIAN ASSISTANT: None  ANESTHESIA:   MAC  EBL: Close procedure, none  BLOOD ADMINISTERED:none  DRAINS: none   LOCAL MEDICATIONS USED:  NONE  SPECIMEN:  No Specimen  DISPOSITION OF SPECIMEN:  N/A  COUNTS:  NO close procedure no instruments used  TOURNIQUET:  * No tourniquets in log *  DICTATION: .Other Dictation: Dictation Number [id number]  PLAN OF CARE: Admit to inpatient   PATIENT DISPOSITION:  PACU - hemodynamically stable.   Delay start of Pharmacological VTE agent (>24hrs) due to surgical blood loss or risk of bleeding: no

## 2023-11-16 NOTE — Consult Note (Signed)
 Reason for Consult: Right total hip replacement dislocation Referring Physician: Drusilla, MD  Sherri [name] is an 83 y.o. female.  HPI: 83 year old female with medical history that includes dementia had a recent fall resulting in a right femoral neck fracture.  She underwent a right total hip replacement for this on 11/12/2023.  She had apparently been up with physical therapy during her postoperative period however was noted yesterday to have shortening through her right lower extremity.  No known event was noted.  A radiograph revealed that her right hip had dislocated.  We were contacted about this and we will work to try to get her into the OR.  She had eaten her meal at approximately 4 PM so decided not to make this an emergency or do this at midnight.  Past Medical History:  Diagnosis Date   Anxiety    takes Valium  daily as needed   Basal cell carcinoma 01/21/1988   left nostril (MOHS), sup-right calf (CX35FU)   Basal cell carcinoma 03/22/1996   right calf (CX35FU)   Basal cell carcinoma 03/31/1995   left wing nose   Bruises easily    d/t meds   Cancer (HCC)    basal cell ca, in situ- uterine    Cataracts, bilateral    removed bilateral   Chronic back pain    Dizziness    r/t to meds   GERD (gastroesophageal reflux disease)    no meds on a regular basis but will take Tums if needed   Headache(784.0)    r/t neck issues   History of bronchitis 6-25yrs ago   History of colon polyps    benign   HOH (hard of hearing)    wears hearing aids   Hyperlipidemia    takes Atorvastatin  on Mondays and Fridays   Hypertension    Joint pain    Joint swelling    Multiple sclerosis (HCC)    Neuromuscular disorder (HCC)    Dr. FABIENE Crete- [facility], follows [facility]   Nocturia    PONV (postoperative nausea and vomiting)    trouble urinating after surgery in 2014   Postoperative nausea and vomiting 01/11/2019   Rheumatoid arthritis (HCC)    Dr Lorrayne weekly, RA- hands-  knees- feet    Rheumatoid arthritis(714.0)    Dr Dolphus takes Xeljanz  daily   Right wrist fracture    Skin cancer    Squamous cell carcinoma of skin 11/02/2001   in situ-right knee (cx23fu)   Squamous cell carcinoma of skin 11/12/2011   in situ-left forearm (CX35FU), in situ-left foot (CX35FU)   Squamous cell carcinoma of skin 01/22/2012   in situ-left lower forearm (txpbx)   Squamous cell carcinoma of skin 11/17/2012   right shin (txpbx)   Squamous cell carcinoma of skin 10/28/2013   in situ-right shoulder (CX35FU)   Squamous cell carcinoma of skin 04/28/2014   well diff-right shin (txpbx)   Squamous cell carcinoma of skin 11/02/2014   in situ-Left shin (txpbx)   Squamous cell carcinoma of skin 12/15/2014   in situ-Left hand (txpbx), bowens-left side chest (txpbx)   Squamous cell carcinoma of skin 05/09/2015   in situ-left hand (txpbx)    Squamous cell carcinoma of skin 05/07/2016   in situ-left inner shin,ant, in situ-left inner shin, post, in situ-left outer forearm, in situ-right knuckle   Squamous cell carcinoma of skin 03/1302018   in situ-left shoulder (txpbx), in situ-right inner shin (txpbx), in situ-top of left foot (txpbx), KA- left forearm (txpbx)  Squamous cell carcinoma of skin 12/02/2016   in situ-left inner shin (txpbx)   Squamous cell carcinoma of skin 03/04/2017   in situ-left outer sup, shin (txpbx), in situ- right 2nd knuckle finger (txpbx), in situ- Left wrist (txpbx)   Squamous cell carcinoma of skin 04/06/2018   in situ-above left knee inner (txpbx)   Squamous cell carcinoma of skin 04/21/2018   in situ-right top hand (txpbx)   Squamous cell carcinoma of skin 07/08/2018   in situ-right lower inner shin (txpbx)   Squamous cell carcinoma of skin 04/27/2019   in situ- Left neck(CX35FU), In situ- right neck (CX35FU)   Urinary retention    sees Dr.Wrenn about 2 times a yr    Past Surgical History:  Procedure Laterality Date   ABDOMINAL HYSTERECTOMY      1985   ANTERIOR CERVICAL DECOMP/DISCECTOMY FUSION N/A 04/23/2022   Procedure: Anterior Cervical Decompression/Discectomy Fusion - Cervical Seven-Thoracic One;  Surgeon: Louis Shove, MD;  Location: [facility];  Service: Neurosurgery;  Laterality: N/A;  3C   APPENDECTOMY     with TAH   BACK SURGERY     several   BREAST BIOPSY Right    Results were negative   BROW LIFT Bilateral 10/02/2016   Procedure: BILATERAL LOWER LID BLEPHAROPLASTY;  Surgeon: Laurie Loyd Redhead, MD;  Location: [facility];  Service: Plastics;  Laterality: Bilateral;   CARPAL TUNNEL RELEASE Right    cataracts     CERVICAL FUSION     Dr Leeann   CERVICAL FUSION  12/31/2012   Dr Leeann   CERVICAL FUSION  04/2022   CHEST TUBE INSERTION     for traumatic Pneumothorax   COLONOSCOPY  07/16/2010   normal    COLONOSCOPY W/ POLYPECTOMY  1997   negative since; Dr Debrah   ESOPHAGOGASTRODUODENOSCOPY  07/16/2010   normal   eye lid raise     EYE SURGERY Bilateral    cataracts removed - /w IOL   HARDWARE REMOVAL  08/2021   JOINT REPLACEMENT     LUMBAR FUSION     Dr Leeann   NASAL SINUS SURGERY     POSTERIOR CERVICAL FUSION/FORAMINOTOMY N/A 12/31/2012   Procedure: POSTERIOR LATERAL CERVICAL FUSION/FORAMINOTOMY LEVEL 1 CERVICAL THREE-FOUR WITH LATERAL MASS SCREWS;  Surgeon: Catalina CHRISTELLA Leeann, MD;  Location: [facility];  Service: Neurosurgery;  Laterality: N/A;   PTOSIS REPAIR Bilateral 10/02/2016   Procedure: INTERNAL PTOSIS REPAIR;  Surgeon: Laurie Loyd Redhead, MD;  Location: [facility];  Service: Plastics;  Laterality: Bilateral;   SKIN BIOPSY     THYROID  SURGERY     R lobe removed- 1972, has grown back - CT last done- 2018   TOTAL HIP ARTHROPLASTY  12/22/2011   Procedure: TOTAL HIP ARTHROPLASTY;  Surgeon: Dempsey JINNY Sensor, MD;  Location: [facility];  Service: Orthopedics;  Laterality: Left;   TOTAL HIP ARTHROPLASTY Right 11/12/2023   Procedure: ARTHROPLASTY, HIP, TOTAL, ANTERIOR APPROACH;  Surgeon: Fidel Rogue, MD;  Location: [facility];   Service: Orthopedics;  Laterality: Right;   TOTAL SHOULDER ARTHROPLASTY     TUBAL LIGATION     UPPER GASTROINTESTINAL ENDOSCOPY  2012   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   WRIST SURGERY Left 09/16/2022   wrist/arm    Family History  Problem Relation Age of Onset   Cancer Mother        pancreatic   Diabetes Mother    Pancreatic cancer Mother    Heart disease Father  Rheumatic   Cancer Father        ? stomach   Stomach cancer Father    Cancer Sister        stomach   Stomach cancer Sister    Cancer Maternal Aunt        X 4; ? primary   Heart disease Paternal Aunt    Cancer Maternal Grandmother        cervical   Colon cancer Other        Aunts   Multiple sclerosis Daughter    Hypertension Neg Hx    Stroke Neg Hx    Esophageal cancer Neg Hx    Rectal cancer Neg Hx     Social History:  reports that she quit smoking about 42 years ago. Her smoking use included cigarettes. She started smoking about 62 years ago. She has a 50 pack-year smoking history. She has never been exposed to tobacco smoke. She has never used smokeless tobacco. She reports current alcohol  use of about 14.0 standard drinks of alcohol  per week. She reports that she does not use drugs.  Allergies:  Allergies  Allergen Reactions   Darvon [Propoxyphene Hcl] Shortness Of Breath    ? Dose Related ? Lowered respirations greatly   Demerol [Meperidine] Shortness Of Breath and Other (See Comments)    Respiratory Distress   Penicillins Hives and Other (See Comments)    Has patient had a PCN reaction causing immediate rash, facial/tongue/throat swelling, SOB or lightheadedness with hypotension: No SEVERE RASH INVOLVING MUCUS MEMBRANES or SKIN NECROSIS: #  #  #  YES  #  #  #  Has patient had a PCN reaction that required hospitalization No Has patient had a PCN reaction occurring within the last 10 years: No.    Imuran [Azathioprine] Other (See Comments)    Weakness   Sulfa  Antibiotics Nausea Only and  Other (See Comments)    States had severe indigestion and heartburn and nausea and had to stop taking   Arava [Leflunomide] Other (See Comments)    Excessive weight gain   Lipitor [Atorvastatin ] Nausea And Vomiting    Currently taking 2 times weekly - able to tolerate low dose    Vytorin [Ezetimibe-Simvastatin] Nausea And Vomiting    Medications: I have reviewed the patient's current medications. Scheduled:  [MAR Hold] aspirin   81 mg Oral BID   [MAR Hold] atorvastatin   10 mg Oral Daily   [MAR Hold] docusate sodium   100 mg Oral Daily   [MAR Hold] DULoxetine   40 mg Oral QHS   [MAR Hold] feeding supplement  237 mL Oral BID BM   [MAR Hold] leptospermum manuka honey  1 Application Topical Daily   [MAR Hold] mupirocin  ointment  1 Application Nasal BID   [MAR Hold] oxybutynin   5 mg Oral Daily   [MAR Hold] pantoprazole   40 mg Oral BID AC   [MAR Hold] predniSONE   5 mg Oral Q breakfast   [MAR Hold] senna  1 tablet Oral BID    No results found. However, due to the size of the patient record, not all encounters were searched. Please check Results Review for a complete set of results.   X-ray: CLINICAL DATA:  Right hip pain status post right hip surgery 3 days ago.   EXAM: PORTABLE PELVIS 1-2 VIEWS   COMPARISON:  11/12/2023   FINDINGS: Bilateral hip replacements. There is superior dislocation of the right hip replacement. The acetabular cup appears more vertically oriented than on prior study. No fracture.  IMPRESSION: Bilateral replacements.   Superior dislocation of the right hip replacement. The acetabular component appears more vertically oriented.     Electronically Signed   By: Franky Crease M.D.  ROS: As per HPI  Blood pressure 137/74, pulse 89, temperature 98.4 F (36.9 C), temperature source Oral, resp. rate 17, height 5' 1 (1.549 m), weight 48.6 kg, SpO2 98%.  Physical Exam: General exam: General-appears in no acute distress Heart-S1-S2, regular, no murmur  auscultated Lungs-clear to auscultation bilaterally, no wheezing or crackles auscultated Abdomen-soft, nontender, no organomegaly Extremities-no edema in the lower extremities Neuro-alert, oriented x3, no focal deficit noted  Right lower extremity exam: Shortening through the right lower extremity Neurovascular intact distally  Assessment/Plan: 1.  Recent history of right total hip replacement for femoral neck fracture on 11/12/2023 now with dislocation  Plan: Based on the clinical and radiographic findings we will take her to the operating room on Monday morning to attempt closed reduction.  If we are unable to get it reduced then case will be rescheduled for an open procedure. N.p.o. after midnight Consent will be obtained through her daughter as power of attorney. These events will be reviewed with her primary surgeon Dr. Fidel.  Sherri [name] 11/16/2023, 6:46 AM

## 2023-11-16 NOTE — H&P (View-Only) (Signed)
 Reason for Consult: Right total hip replacement dislocation Referring Physician: Drusilla, MD  Sherri [name]  A Hauck is an 83 y.o. female.  HPI: 83 year old female with medical history that includes dementia had a recent fall resulting in a right femoral neck fracture.  She underwent a right total hip replacement for this on 11/12/2023.  She had apparently been up with physical therapy during her postoperative period however was noted yesterday to have shortening through her right lower extremity.  No known event was noted.  A radiograph revealed that her right hip had dislocated.  We were contacted about this and we will work to try to get her into the OR.  She had eaten her meal at approximately 4 PM so decided not to make this an emergency or do this at midnight.  Past Medical History:  Diagnosis Date   Anxiety    takes Valium  daily as needed   Basal cell carcinoma 01/21/1988   left nostril (MOHS), sup-right calf (CX35FU)   Basal cell carcinoma 03/22/1996   right calf (CX35FU)   Basal cell carcinoma 03/31/1995   left wing nose   Bruises easily    d/t meds   Cancer (HCC)    basal cell ca, in situ- uterine    Cataracts, bilateral    removed bilateral   Chronic back pain    Dizziness    r/t to meds   GERD (gastroesophageal reflux disease)    no meds on a regular basis but will take Tums if needed   Headache(784.0)    r/t neck issues   History of bronchitis 6-25yrs ago   History of colon polyps    benign   HOH (hard of hearing)    wears hearing aids   Hyperlipidemia    takes Atorvastatin  on Mondays and Fridays   Hypertension    Joint pain    Joint swelling    Multiple sclerosis (HCC)    Neuromuscular disorder (HCC)    Dr. FABIENE Crete- [facility], follows [facility]   Nocturia    PONV (postoperative nausea and vomiting)    trouble urinating after surgery in 2014   Postoperative nausea and vomiting 01/11/2019   Rheumatoid arthritis (HCC)    Dr Lorrayne weekly, RA- hands-  knees- feet    Rheumatoid arthritis(714.0)    Dr Dolphus takes Xeljanz  daily   Right wrist fracture    Skin cancer    Squamous cell carcinoma of skin 11/02/2001   in situ-right knee (cx23fu)   Squamous cell carcinoma of skin 11/12/2011   in situ-left forearm (CX35FU), in situ-left foot (CX35FU)   Squamous cell carcinoma of skin 01/22/2012   in situ-left lower forearm (txpbx)   Squamous cell carcinoma of skin 11/17/2012   right shin (txpbx)   Squamous cell carcinoma of skin 10/28/2013   in situ-right shoulder (CX35FU)   Squamous cell carcinoma of skin 04/28/2014   well diff-right shin (txpbx)   Squamous cell carcinoma of skin 11/02/2014   in situ-Left shin (txpbx)   Squamous cell carcinoma of skin 12/15/2014   in situ-Left hand (txpbx), bowens-left side chest (txpbx)   Squamous cell carcinoma of skin 05/09/2015   in situ-left hand (txpbx)    Squamous cell carcinoma of skin 05/07/2016   in situ-left inner shin,ant, in situ-left inner shin, post, in situ-left outer forearm, in situ-right knuckle   Squamous cell carcinoma of skin 03/1302018   in situ-left shoulder (txpbx), in situ-right inner shin (txpbx), in situ-top of left foot (txpbx), KA- left forearm (txpbx)  Squamous cell carcinoma of skin 12/02/2016   in situ-left inner shin (txpbx)   Squamous cell carcinoma of skin 03/04/2017   in situ-left outer sup, shin (txpbx), in situ- right 2nd knuckle finger (txpbx), in situ- Left wrist (txpbx)   Squamous cell carcinoma of skin 04/06/2018   in situ-above left knee inner (txpbx)   Squamous cell carcinoma of skin 04/21/2018   in situ-right top hand (txpbx)   Squamous cell carcinoma of skin 07/08/2018   in situ-right lower inner shin (txpbx)   Squamous cell carcinoma of skin 04/27/2019   in situ- Left neck(CX35FU), In situ- right neck (CX35FU)   Urinary retention    sees Dr.Wrenn about 2 times a yr    Past Surgical History:  Procedure Laterality Date   ABDOMINAL HYSTERECTOMY      1985   ANTERIOR CERVICAL DECOMP/DISCECTOMY FUSION N/A 04/23/2022   Procedure: Anterior Cervical Decompression/Discectomy Fusion - Cervical Seven-Thoracic One;  Surgeon: Louis Shove, MD;  Location: [facility];  Service: Neurosurgery;  Laterality: N/A;  3C   APPENDECTOMY     with TAH   BACK SURGERY     several   BREAST BIOPSY Right    Results were negative   BROW LIFT Bilateral 10/02/2016   Procedure: BILATERAL LOWER LID BLEPHAROPLASTY;  Surgeon: Laurie Loyd Redhead, MD;  Location: [facility];  Service: Plastics;  Laterality: Bilateral;   CARPAL TUNNEL RELEASE Right    cataracts     CERVICAL FUSION     Dr Leeann   CERVICAL FUSION  12/31/2012   Dr Leeann   CERVICAL FUSION  04/2022   CHEST TUBE INSERTION     for traumatic Pneumothorax   COLONOSCOPY  07/16/2010   normal    COLONOSCOPY W/ POLYPECTOMY  1997   negative since; Dr Debrah   ESOPHAGOGASTRODUODENOSCOPY  07/16/2010   normal   eye lid raise     EYE SURGERY Bilateral    cataracts removed - /w IOL   HARDWARE REMOVAL  08/2021   JOINT REPLACEMENT     LUMBAR FUSION     Dr Leeann   NASAL SINUS SURGERY     POSTERIOR CERVICAL FUSION/FORAMINOTOMY N/A 12/31/2012   Procedure: POSTERIOR LATERAL CERVICAL FUSION/FORAMINOTOMY LEVEL 1 CERVICAL THREE-FOUR WITH LATERAL MASS SCREWS;  Surgeon: Catalina CHRISTELLA Leeann, MD;  Location: [facility];  Service: Neurosurgery;  Laterality: N/A;   PTOSIS REPAIR Bilateral 10/02/2016   Procedure: INTERNAL PTOSIS REPAIR;  Surgeon: Laurie Loyd Redhead, MD;  Location: [facility];  Service: Plastics;  Laterality: Bilateral;   SKIN BIOPSY     THYROID  SURGERY     R lobe removed- 1972, has grown back - CT last done- 2018   TOTAL HIP ARTHROPLASTY  12/22/2011   Procedure: TOTAL HIP ARTHROPLASTY;  Surgeon: Dempsey JINNY Sensor, MD;  Location: [facility];  Service: Orthopedics;  Laterality: Left;   TOTAL HIP ARTHROPLASTY Right 11/12/2023   Procedure: ARTHROPLASTY, HIP, TOTAL, ANTERIOR APPROACH;  Surgeon: Fidel Rogue, MD;  Location: [facility];   Service: Orthopedics;  Laterality: Right;   TOTAL SHOULDER ARTHROPLASTY     TUBAL LIGATION     UPPER GASTROINTESTINAL ENDOSCOPY  2012   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   WRIST SURGERY Left 09/16/2022   wrist/arm    Family History  Problem Relation Age of Onset   Cancer Mother        pancreatic   Diabetes Mother    Pancreatic cancer Mother    Heart disease Father  Rheumatic   Cancer Father        ? stomach   Stomach cancer Father    Cancer Sister        stomach   Stomach cancer Sister    Cancer Maternal Aunt        X 4; ? primary   Heart disease Paternal Aunt    Cancer Maternal Grandmother        cervical   Colon cancer Other        Aunts   Multiple sclerosis Daughter    Hypertension Neg Hx    Stroke Neg Hx    Esophageal cancer Neg Hx    Rectal cancer Neg Hx     Social History:  reports that she quit smoking about 42 years ago. Her smoking use included cigarettes. She started smoking about 62 years ago. She has a 50 pack-year smoking history. She has never been exposed to tobacco smoke. She has never used smokeless tobacco. She reports current alcohol  use of about 14.0 standard drinks of alcohol  per week. She reports that she does not use drugs.  Allergies:  Allergies  Allergen Reactions   Darvon [Propoxyphene Hcl] Shortness Of Breath    ? Dose Related ? Lowered respirations greatly   Demerol [Meperidine] Shortness Of Breath and Other (See Comments)    Respiratory Distress   Penicillins Hives and Other (See Comments)    Has patient had a PCN reaction causing immediate rash, facial/tongue/throat swelling, SOB or lightheadedness with hypotension: No SEVERE RASH INVOLVING MUCUS MEMBRANES or SKIN NECROSIS: #  #  #  YES  #  #  #  Has patient had a PCN reaction that required hospitalization No Has patient had a PCN reaction occurring within the last 10 years: No.    Imuran [Azathioprine] Other (See Comments)    Weakness   Sulfa  Antibiotics Nausea Only and  Other (See Comments)    States had severe indigestion and heartburn and nausea and had to stop taking   Arava [Leflunomide] Other (See Comments)    Excessive weight gain   Lipitor [Atorvastatin ] Nausea And Vomiting    Currently taking 2 times weekly - able to tolerate low dose    Vytorin [Ezetimibe-Simvastatin] Nausea And Vomiting    Medications: I have reviewed the patient's current medications. Scheduled:  [MAR Hold] aspirin   81 mg Oral BID   [MAR Hold] atorvastatin   10 mg Oral Daily   [MAR Hold] docusate sodium   100 mg Oral Daily   [MAR Hold] DULoxetine   40 mg Oral QHS   [MAR Hold] feeding supplement  237 mL Oral BID BM   [MAR Hold] leptospermum manuka honey  1 Application Topical Daily   [MAR Hold] mupirocin  ointment  1 Application Nasal BID   [MAR Hold] oxybutynin   5 mg Oral Daily   [MAR Hold] pantoprazole   40 mg Oral BID AC   [MAR Hold] predniSONE   5 mg Oral Q breakfast   [MAR Hold] senna  1 tablet Oral BID    No results found. However, due to the size of the patient record, not all encounters were searched. Please check Results Review for a complete set of results.   X-ray: CLINICAL DATA:  Right hip pain status post right hip surgery 3 days ago.   EXAM: PORTABLE PELVIS 1-2 VIEWS   COMPARISON:  11/12/2023   FINDINGS: Bilateral hip replacements. There is superior dislocation of the right hip replacement. The acetabular cup appears more vertically oriented than on prior study. No fracture.  IMPRESSION: Bilateral replacements.   Superior dislocation of the right hip replacement. The acetabular component appears more vertically oriented.     Electronically Signed   By: Franky Crease M.D.  ROS: As per HPI  Blood pressure 137/74, pulse 89, temperature 98.4 F (36.9 C), temperature source Oral, resp. rate 17, height 5' 1 (1.549 m), weight 48.6 kg, SpO2 98%.  Physical Exam: General exam: General-appears in no acute distress Heart-S1-S2, regular, no murmur  auscultated Lungs-clear to auscultation bilaterally, no wheezing or crackles auscultated Abdomen-soft, nontender, no organomegaly Extremities-no edema in the lower extremities Neuro-alert, oriented x3, no focal deficit noted  Right lower extremity exam: Shortening through the right lower extremity Neurovascular intact distally  Assessment/Plan: 1.  Recent history of right total hip replacement for femoral neck fracture on 11/12/2023 now with dislocation  Plan: Based on the clinical and radiographic findings we will take her to the operating room on Monday morning to attempt closed reduction.  If we are unable to get it reduced then case will be rescheduled for an open procedure. N.p.o. after midnight Consent will be obtained through her daughter as power of attorney. These events will be reviewed with her primary surgeon Dr. Fidel.  [name] 11/16/2023, 6:46 AM

## 2023-11-16 NOTE — Transfer of Care (Signed)
 Immediate Anesthesia Transfer of Care Note  Patient: Sherri [name]  Procedure(s) Performed: Procedure(s): CLOSED MANIPULATION, JOINT, HIP (Right)  Patient Location: PACU  Anesthesia Type:General  Level of Consciousness:  sedated, patient cooperative and responds to stimulation  Airway & Oxygen Therapy:Patient Spontanous Breathing and Patient connected to face mask oxgen  Post-op Assessment:  Report given to PACU RN and Post -op Vital signs reviewed and stable  Post vital signs:  Reviewed and stable  Last Vitals:  Vitals:   11/15/23 2129 11/16/23 0530  BP: 126/71 137/74  Pulse: 88 89  Resp: 17 17  Temp: 36.7 C 36.9 C  SpO2: 100% 98%    Complications: No apparent anesthesia complications

## 2023-11-16 NOTE — Anesthesia Postprocedure Evaluation (Signed)
 Anesthesia Post Note  Patient: Sherri  [name]  Procedure(s) Performed: CLOSED MANIPULATION, JOINT, HIP (Right: Hip)     Patient location during evaluation: PACU Anesthesia Type: General Level of consciousness: awake and alert Pain management: pain level controlled Vital Signs Assessment: post-procedure vital signs reviewed and stable Respiratory status: spontaneous breathing, nonlabored ventilation, respiratory function stable and patient connected to nasal cannula oxygen Cardiovascular status: blood pressure returned to baseline and stable Postop Assessment: no apparent nausea or vomiting Anesthetic complications: no  No notable events documented.  Last Vitals:  Vitals:   11/16/23 0800 11/16/23 0834  BP: 125/74 130/75  Pulse: 95 86  Resp: 12 16  Temp:  36.6 C  SpO2: 100% 95%    Last Pain:  Vitals:   11/16/23 0753  TempSrc:   PainSc: 6                  Suzzane Quilter,W. EDMOND

## 2023-11-16 NOTE — Plan of Care (Signed)
  Problem: Education: Goal: Knowledge of General Education information will improve Description: Including pain rating scale, medication(s)/side effects and non-pharmacologic comfort measures Outcome: Progressing   Problem: Health Behavior/Discharge Planning: Goal: Ability to manage health-related needs will improve Outcome: Progressing   Problem: Clinical Measurements: Goal: Ability to maintain clinical measurements within normal limits will improve Outcome: Progressing Goal: Will remain free from infection Outcome: Progressing Goal: Diagnostic test results will improve Outcome: Progressing Goal: Respiratory complications will improve Outcome: Progressing Goal: Cardiovascular complication will be avoided Outcome: Progressing   Problem: Activity: Goal: Risk for activity intolerance will decrease Outcome: Progressing   Problem: Nutrition: Goal: Adequate nutrition will be maintained Outcome: Progressing   Problem: Coping: Goal: Level of anxiety will decrease Outcome: Progressing   Problem: Elimination: Goal: Will not experience complications related to bowel motility Outcome: Progressing   Problem: Pain Managment: Goal: General experience of comfort will improve and/or be controlled Outcome: Progressing   Problem: Safety: Goal: Ability to remain free from injury will improve Outcome: Progressing   Problem: Skin Integrity: Goal: Risk for impaired skin integrity will decrease Outcome: Progressing   Problem: Education: Goal: Knowledge of the prescribed therapeutic regimen will improve Outcome: Progressing Goal: Understanding of discharge needs will improve Outcome: Progressing   Problem: Activity: Goal: Ability to avoid complications of mobility impairment will improve Outcome: Progressing Goal: Ability to tolerate increased activity will improve Outcome: Progressing   Problem: Clinical Measurements: Goal: Postoperative complications will be avoided or  minimized Outcome: Progressing   Problem: Pain Management: Goal: Pain level will decrease with appropriate interventions Outcome: Progressing   Problem: Skin Integrity: Goal: Will show signs of wound healing Outcome: Progressing

## 2023-11-16 NOTE — Progress Notes (Signed)
 Orthopedic Tech Progress Note Patient Details:  Sherri  [name] 1940-08-25 [id number]  Patient ID: Sherri  [name], female   DOB: 04/15/1941, 83 y.o.   MRN: [id number] Hip abd [facility]. Laymon DELENA Munroe 11/16/2023, 5:47 PM

## 2023-11-17 ENCOUNTER — Encounter (HOSPITAL_COMMUNITY): Payer: Self-pay | Admitting: Orthopedic Surgery

## 2023-11-17 DIAGNOSIS — I959 Hypotension, unspecified: Secondary | ICD-10-CM | POA: Diagnosis not present

## 2023-11-17 DIAGNOSIS — S72001A Fracture of unspecified part of neck of right femur, initial encounter for closed fracture: Secondary | ICD-10-CM | POA: Diagnosis not present

## 2023-11-17 DIAGNOSIS — S72009A Fracture of unspecified part of neck of unspecified femur, initial encounter for closed fracture: Secondary | ICD-10-CM | POA: Diagnosis not present

## 2023-11-17 LAB — BASIC METABOLIC PANEL WITH GFR
Anion gap: 9 (ref 5–15)
BUN: 8 mg/dL (ref 8–23)
CO2: 25 mmol/L (ref 22–32)
Calcium: 8 mg/dL — ABNORMAL LOW (ref 8.9–10.3)
Chloride: 100 mmol/L (ref 98–111)
Creatinine, Ser: 0.71 mg/dL (ref 0.44–1.00)
GFR, Estimated: >60 mL/min (ref >60)
Glucose, Bld: 92 mg/dL (ref 70–99)
Potassium: 4.2 mmol/L (ref 3.5–5.1)
Sodium: 134 mmol/L — ABNORMAL LOW (ref 135–145)

## 2023-11-17 LAB — CBC
HCT: 25.9 % — ABNORMAL LOW (ref 36.0–46.0)
Hemoglobin: 8.3 g/dL — ABNORMAL LOW (ref 12.0–15.0)
MCH: 33.7 pg (ref 26.0–34.0)
MCHC: 32 g/dL (ref 30.0–36.0)
MCV: 105.3 fL — ABNORMAL HIGH (ref 80.0–100.0)
Platelets: 289 10*3/uL (ref 150–400)
RBC: 2.46 MIL/uL — ABNORMAL LOW (ref 3.87–5.11)
RDW: 16.9 % — ABNORMAL HIGH (ref 11.5–15.5)
WBC: 8.5 10*3/uL (ref 4.0–10.5)
nRBC: 0.7 % — ABNORMAL HIGH (ref 0.0–0.2)

## 2023-11-17 NOTE — Progress Notes (Signed)
 [facility]  PROGRESS NOTE  Sherri [name]  A Back FMW:[id number] DOB: 30-Sep-1940 DOA: 11/10/2023 PCP: [name] PARAS, MD   Brief HPI:   83 y.o. female with medical history significant of anxiety, GERD, rheumatoid arthritis, hypertension, multiple sclerosis, lipidemia who presented to the emergency department due to hip pain.  Patient was admitted in May.  During this hospitalization she was treated for cellulitis and sepsis.  She had a mechanical fall has CT scan which showed no evidence of fracture.  She been having chronic pain of her left hip and difficulty walking.  She went to her primary care doctor who ordered outpatient imaging which showed impacted hip fracture.  She was referred to the emergency department for further assessment.     Assessment/Plan:    Right impacted hip fracture -Subacute displaced right femoral neck fracture, status post total hip arthroplasty Aspirin  81 mg p.o. twice daily for 45 days for DVT prophylaxis  Dislocation of total right hip arthroplasty; found on 11/15/2023 -Underwent closed reduction of total hip replacement per orthopedics  WBAT with walker and hip abduction brace. No active hip abduction for 6 weeks. Posterior hip precautions.   Anemia Hemoglobin dropped to 6.5  -Status post 1 unit PRBC, hemoglobin is up to 8.3 Follow CBC in a.m.    # Chronic pain -continue Cymbalta  As needed fentanyl    # Urinary retention -continue oxybutynin    # GERD -continue Protonix    # Rheumatoid arthritis -continue prednisone  -Has orthostatic hypotension, prednisone  was not started after she was was given hydrocortisone  stress dose -Will restart prednisone  5 mg daily, was given additional dose of hydrocortisone  50 mg IV.   - Continue to monitor patient's blood pressure   # Hyperlipidemia -continue statin   # Hypokalemia - Replete  #Hypotension - Resolved -Likely in setting of adrenal insufficiency in setting of surgery - Continue to hold Coreg  -  Continue prednisone  5 mg daily Received hydrocortisone  25 mg IV x 4 doses for stress dose   Hyponatremia, chronic - Sodium is improving; back to baseline   Medications     aspirin   81 mg Oral BID   atorvastatin   10 mg Oral Daily   docusate sodium   100 mg Oral Daily   DULoxetine   40 mg Oral QHS   feeding supplement  237 mL Oral BID BM   leptospermum manuka honey  1 Application Topical Daily   oxybutynin   5 mg Oral Daily   pantoprazole   40 mg Oral BID AC   predniSONE   5 mg Oral Q breakfast   senna  1 tablet Oral BID     Data Reviewed:   CBG:  No results for input(s): GLUCAP in the last 168 hours.  SpO2: 99 % O2 Flow Rate (L/min): 3 L/min    Vitals:   11/16/23 1712 11/16/23 2139 11/17/23 0140 11/17/23 0621  BP: 135/84 (!) 153/83 (!) 154/83 (!) 147/89  Pulse: (!) 108 99 (!) 103 98  Resp: 16 17 16 16   Temp: 98.2 F (36.8 C) 97.9 F (36.6 C) 98 F (36.7 C) 97.8 F (36.6 C)  TempSrc: Oral Oral Oral Oral  SpO2: 98% 97% 97% 99%  Weight:      Height:          Data Reviewed:  Basic Metabolic Panel: Recent Labs  Lab 11/12/23 0354 11/13/23 0335 11/14/23 0335 11/15/23 0317 11/17/23 0347  NA 126* 131* 130* 132* 134*  K 3.2* 4.5 4.3 3.7 4.2  CL 89* 95* 95* 97* 100  CO2 28 28 26  26 25  GLUCOSE 97 160* 137* 98 92  BUN 10 7* 11 11 8   CREATININE 0.60 0.56 0.75 0.69 0.71  CALCIUM  7.8* 7.6* 7.8* 8.1* 8.0*    CBC: Recent Labs  Lab 11/12/23 0354 11/14/23 0335 11/14/23 0720 11/15/23 0317 11/17/23 0347  WBC 9.4 12.7* 12.4* 8.5 8.5  HGB 10.3* 6.5* 6.6* 9.2* 8.3*  HCT 31.8* 20.6* 20.3* 27.9* 25.9*  MCV 108.9* 109.6* 107.4* 102.2* 105.3*  PLT 263 248 276 257 289    LFT No results for input(s): AST, ALT, ALKPHOS, BILITOT, PROT, ALBUMIN  in the last 168 hours.   Antibiotics: Anti-infectives (From admission, onward)    Start     Dose/Rate Route Frequency Ordered Stop   11/16/23 0612  ceFAZolin  (ANCEF ) 2-4 GM/100ML-% IVPB       Note to  Pharmacy: Viann Perkins M: cabinet override      11/16/23 0612 11/16/23 1829   11/13/23 0600  vancomycin  (VANCOCIN ) IVPB 1000 mg/200 mL premix        1,000 mg 200 mL/hr over 60 Minutes Intravenous On call to O.R. 11/12/23 1431 11/13/23 0641   11/12/23 2200  ceFAZolin  (ANCEF ) IVPB 2g/100 mL premix        2 g 200 mL/hr over 30 Minutes Intravenous Every 6 hours 11/12/23 2006 11/13/23 0430   11/12/23 2200  vancomycin  (VANCOCIN ) IVPB 1000 mg/200 mL premix        1,000 mg 200 mL/hr over 60 Minutes Intravenous  Once 11/12/23 2006 11/13/23 0040   11/12/23 1442  sodium chloride  0.9 % with cefTRIAXone  (ROCEPHIN ) ADS Med  Status:  Discontinued       Note to Pharmacy: [name] W: cabinet override      11/12/23 1442 11/12/23 1449        DVT prophylaxis: SCDs  Code Status: Full code  Family Communication: No family at bedside   CONSULTS orthopedics   Subjective   Denies any pain.  Objective    Physical Examination:  Appears in no acute distress S1-S2, regular Lungs clear to auscultation bilaterally Abdomen soft, nontender   Status is: Inpatient:             Sherri [name]   [facility] If 7PM-7AM, please contact night-coverage at [email or web address], Office  [phone]   11/17/2023, 7:54 AM  LOS: 6 days

## 2023-11-17 NOTE — Progress Notes (Signed)
 Physical Therapy Treatment Patient Details Name: Sherri [name] MRN: [id number] DOB: 06-04-1940 Today's Date: 11/17/2023   History of Present Illness Pt admitted with R hip fx and now s/p THR.  Pt with hx of fall with non-operative R elbow fx and hip fx not diagnosed until pt in SNF rehab ~ 1 month.  Pt taken home by family and to see PCP who did hip Xray and sent pt directly to [facility] for surgery.  pt underwent closed reduction R THA on 11/16/23.  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    PT Comments  POD # 1 Hip Closed Reduction s/p dislocation POD # 5 L THR Pt to wear ABD brace (not yet arrived so wearing KI) now with THP and no active ABd Assisted OOB was very difficult. General bed mobility comments: Pt required Max Assist + 2 to transfer from supine to EOB using bed pad to complete scooting to EOB while adhering to THP and no active hip ABd. General transfer comment: increased assist due to KI (on all times) cues for LE management and use of UEs to self assist; physical assist to bring wt up and fwd and to balance in standing with RW; returned to sitting and performed stand pvt bed to [facility].  Then from BCS to recliner with switching out from behind as Pt briefly stood. Pt was unable to amb.  General Gait Details: attempted however pt was unable to functionally weightshift or advance either LE P was rec for [facility] PT however after her dislocation is requiring increased assist.  Will update LPT, Pt may need SNF.    If plan is discharge home, recommend the following: A lot of help with walking and/or transfers;A lot of help with bathing/dressing/bathroom;Assistance with cooking/housework;Assist for transportation;Help with stairs or ramp for entrance   Can travel by private vehicle     No  Equipment Recommendations  None recommended by PT    Recommendations for Other Services       Precautions / Restrictions Precautions Precautions: Fall Required Braces or  Orthoses: Knee Immobilizer - Right;Other Brace Knee Immobilizer - Right: On at all times Other Brace: ABd Brace from [employer] Restrictions Weight Bearing Restrictions Per Provider Order: No RLE Weight Bearing Per Provider Order: Weight bearing as tolerated Other Position/Activity Restrictions: THP and no active ABd     Mobility  Bed Mobility Overal bed mobility: Needs Assistance Bed Mobility: Supine to Sit     Supine to sit: Max assist     General bed mobility comments: Pt required Max Assist + 2 to transfer from supine to EOB using bed pad to complete scooting to EOB while adhering to THP and no active hip ABd.    Transfers Overall transfer level: Needs assistance Equipment used: Rolling walker (2 wheels) Transfers: Sit to/from Stand, Bed to chair/wheelchair/BSC Sit to Stand: Mod assist, Max assist, +2 physical assistance, +2 safety/equipment, From elevated surface           General transfer comment: increased assist due to KI (on all times) cues for LE management and use of UEs to self assist; physical assist to bring wt up and fwd and to balance in standing with RW; returned to sitting and performed stand pvt bed to [facility].  Then from BCS to recliner with switching out from behind as Pt briefly stood.    Ambulation/Gait               General Gait Details: attempted  however pt was unable to functionally weightshift or advance either LE   Stairs             Wheelchair Mobility     Tilt Bed    Modified Rankin (Stroke Patients Only)       Balance                                            Communication Communication Communication: Impaired Factors Affecting Communication: Hearing impaired  Cognition Arousal: Alert Behavior During Therapy: WFL for tasks assessed/performed   PT - Cognitive impairments: No apparent impairments                       PT - Cognition Comments: AxO x 3 pleasant Lady.  A litle nervous.   following all commands. Following commands: Intact      Cueing Cueing Techniques: Verbal cues, Gestural cues  Exercises      General Comments        Pertinent Vitals/Pain Pain Assessment Pain Assessment: Faces Faces Pain Scale: Hurts little more Pain Location: R hip/thigh with activity Pain Descriptors / Indicators: Aching, Sore, Operative site guarding Pain Intervention(s): Monitored during session, Repositioned    Home Living                          Prior Function            PT Goals (current goals can now be found in the care plan section) Progress towards PT goals: Progressing toward goals    Frequency    7X/week      PT Plan      Co-evaluation              AM-PAC PT 6 Clicks Mobility   Outcome Measure  Help needed turning from your back to your side while in a flat bed without using bedrails?: A Lot Help needed moving from lying on your back to sitting on the side of a flat bed without using bedrails?: A Lot Help needed moving to and from a bed to a chair (including a wheelchair)?: A Lot Help needed standing up from a chair using your arms (e.g., wheelchair or bedside chair)?: A Lot Help needed to walk in hospital room?: A Lot Help needed climbing 3-5 steps with a railing? : Total 6 Click Score: 11    End of Session Equipment Utilized During Treatment: Gait belt Activity Tolerance: Patient limited by fatigue Patient left: in chair;with call bell/phone within reach;with chair alarm set Nurse Communication: Mobility status PT Visit Diagnosis: Difficulty in walking, not elsewhere classified (R26.2);Muscle weakness (generalized) (M62.81)     Time: 8966-8895 PT Time Calculation (min) (ACUTE ONLY): 31 min  Charges:    $Therapeutic Activity: 23-37 mins PT General Charges $$ ACUTE PT VISIT: 1 Visit                     Katheryn Leap  PTA Acute  Rehabilitation Services Office M-F          [phone]

## 2023-11-17 NOTE — TOC Progression Note (Signed)
 Transition of Care [facility]) - Progression Note    Patient Details  Name: Sherri  CAMEY [name] MRN: [id number] Date of Birth: [birth year]  [facility] CM/SW Contact  Alfonse JONELLE Rex, RN Phone Number: 11/17/2023, 11:52 AM  Clinical Narrative:   Patient underwent Closed reduction of right total hip replacement on 6/23 due to hip dislocation. PT following, await recommendation.     Expected Discharge Plan: Home w Home Health Services Barriers to Discharge: Continued Medical Work up  Expected Discharge Plan and Services       Living arrangements for the past 2 months: Single Family Home                                       Social Determinants of Health (SDOH) Interventions SDOH Screenings   Food Insecurity: No Food Insecurity (11/10/2023)  Housing: Low Risk  (11/10/2023)  Transportation Needs: No Transportation Needs (11/10/2023)  Utilities: Not At Risk (11/10/2023)  Alcohol  Screen: Low Risk  (02/21/2022)  Depression (PHQ2-9): Low Risk  (07/27/2023)  Financial Resource Strain: Low Risk  (02/21/2022)  Physical Activity: Inactive (02/21/2022)  Social Connections: Socially Integrated (11/10/2023)  Recent Concern: Social Connections - Moderately Isolated (10/03/2023)  Stress: Stress Concern Present (02/21/2022)  Tobacco Use: Medium Risk (11/16/2023)    Readmission Risk Interventions     No data to display

## 2023-11-17 NOTE — Progress Notes (Signed)
    Subjective:  Patient reports pain as mild.  Denies N/V/CP/SOB/Abd pain.  Son at bedside.  Discussed hip abduction brace, no active hip abduction and posterior hip precautions this am.  She reports no significant pain.  Denies tingling or numbness in LE bilaterally.   Objective:   VITALS:   Vitals:   11/16/23 1712 11/16/23 2139 11/17/23 0140 11/17/23 0621  BP: 135/84 (!) 153/83 (!) 154/83 (!) 147/89  Pulse: (!) 108 99 (!) 103 98  Resp: 16 17 16 16   Temp: 98.2 F (36.8 C) 97.9 F (36.6 C) 98 F (36.7 C) 97.8 F (36.6 C)  TempSrc: Oral Oral Oral Oral  SpO2: 98% 97% 97% 99%  Weight:      Height:        NAD Neurologically intact ABD soft Neurovascular intact Sensation intact distally Intact pulses distally Dorsiflexion/Plantar flexion intact Incision: scant drainage Compartment soft KI intact.   Lab Results  Component Value Date   WBC 8.5 11/17/2023   HGB 8.3 (L) 11/17/2023   HCT 25.9 (L) 11/17/2023   MCV 105.3 (H) 11/17/2023   PLT 289 11/17/2023   BMET    Component Value Date/Time   NA 134 (L) 11/17/2023 0347   NA 139 06/20/2019 1335   NA 136 02/22/2015 0000   K 4.2 11/17/2023 0347   K 4.4 02/22/2015 0000   CL 100 11/17/2023 0347   CL 98 02/22/2015 0000   CO2 25 11/17/2023 0347   CO2 30 02/22/2015 0000   GLUCOSE 92 11/17/2023 0347   BUN 8 11/17/2023 0347   BUN 10 06/20/2019 1335   CREATININE 0.71 11/17/2023 0347   CREATININE 1.03 (H) 08/06/2023 1510   CALCIUM  8.0 (L) 11/17/2023 0347   CALCIUM  9.6 02/22/2015 0000   EGFR 55 (L) 08/06/2023 1510   GFRNONAA >60 11/17/2023 0347   GFRNONAA 56 (L) 09/26/2020 1536     Assessment/Plan: 1 Day Post-Op   Principal Problem:   Hip fracture (HCC)   WBAT with walker and hip abduction brace. No active hip abduction for 6 weeks. Posterior hip precautions.  DVT ppx: Aspirin , SCDs, TEDS PO pain control PT/OT Dispo:  - patient under care of the medical team disposition per their recommendation. Pain  medication printed in chart.    Valery GORMAN Potters 11/17/2023, 8:17 AM   [facility]  [facility] [address], [address], [place], [place] [address] Phone: [phone] [email or web address] Facebook  [employer]

## 2023-11-17 NOTE — NC FL2 (Signed)
 [place]  MEDICAID FL2 LEVEL OF CARE FORM     IDENTIFICATION  Patient Name: Sherri [name] Birthdate: 1940-08-19 Sex: female Admission Date (Current Location): 11/10/2023  [facility] and [place] Number:  Producer, television/film/video and Address:  [facility],  [address], [place] [address]      Provider Number: [id number]  Attending Physician Name and Address:  Drusilla Sabas RAMAN, MD  Relative Name and Phone Number:  Arthurine Iha (Daughter)  [phone] [facility])    Current Level of Care:   Recommended Level of Care:   Prior Approval Number:    Date Approved/Denied:   PASRR Number: [id number]  Discharge Plan: SNF    Current Diagnoses: Patient Active Problem List   Diagnosis Date Noted   Lightheadedness 11/10/2023   Physical deconditioning 11/10/2023   Parotid swelling 11/10/2023   Infected laceration of skin 11/10/2023   Skin tear of elbow without complication, right, initial encounter 11/10/2023   Hip fracture (HCC) 11/10/2023   Sepsis due to cellulitis (HCC) 10/03/2023   Cellulitis 10/03/2023   Pressure injury of skin 10/03/2023   Hyponatremia 10/02/2023   Fall at home, initial encounter 10/02/2023   Cellulitis of right lower extremity 10/02/2023   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   Cervical spondylosis with myelopathy and radiculopathy 04/23/2022   Constipation 03/04/2022   Upper back pain on left side 03/04/2022   Lumbar vertebral fracture, pathologic 09/20/2021   Degenerative spondylolisthesis 08/19/2021   Acute left lumbar radiculopathy 07/05/2021   Senile purpura (HCC) 12/19/2020   Restless leg 02/02/2020   Bilateral leg edema 01/11/2020   Cervical radiculopathy at C8 06/28/2019   Carpal tunnel syndrome on both sides 06/28/2019   Right sided sciatica 04/14/2018   Trochanteric bursitis of right hip 04/14/2018   H/O cervical spine surgery 11/10/2017   Dysphagia 10/26/2017   Gastroesophageal reflux disease  08/06/2017   Easy bruising 08/06/2017   Mid back pain 03/26/2017   Submandibular gland swelling 01/08/2017   High risk medication use 09/02/2016   Piriformis muscle pain 09/02/2016   Right hip pain 04/08/2016   Right knee pain 02/25/2016   Numbness and tingling in left hand 11/14/2014   Biceps tendon tear 10/10/2014   Insomnia 10/10/2014   Gait disturbance 06/13/2014   OP (osteoporosis) 01/18/2014   Other long term (current) drug therapy 01/18/2014   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 01/01/2013   History of colonic polyps 07/03/2010   Nocturia 05/28/2010   THYROID  NODULE 05/17/2008   Multiple sclerosis (HCC) 05/17/2008   Other fatigue 03/30/2007   Hyperlipidemia 08/20/2006   Essential hypertension 08/20/2006   Rheumatoid arthritis (HCC) 08/20/2006   Osteoarthritis 08/20/2006   CERVICAL CANCER, HX OF 08/20/2006   GASTRIC ULCER, HX OF 08/20/2006    Orientation RESPIRATION BLADDER Height & Weight     Self, Time, Situation, Place  Normal Incontinent, External catheter Weight: 48.6 kg Height:  5' 1 (154.9 cm)  BEHAVIORAL SYMPTOMS/MOOD NEUROLOGICAL BOWEL NUTRITION STATUS      Continent Diet (regular diet)  AMBULATORY STATUS COMMUNICATION OF NEEDS Skin   Extensive Assist Verbally Other (Comment) (Skin tear right lower arm-foam dressing, Abrasion right and left elbow-foam dressing, Right Total Hip Arthroplasty incision)                       Personal Care Assistance Level of Assistance  Bathing, Dressing, Feeding Bathing Assistance: Maximum assistance Feeding assistance: Maximum assistance Dressing Assistance: Maximum assistance     Functional Limitations  Info  Sight, Speech, Hearing Sight Info: Impaired (eyeglasses) Hearing Info: Impaired (hard of hearing) Speech Info: Adequate    SPECIAL CARE FACTORS FREQUENCY  PT (By licensed PT), OT (By licensed OT)     PT Frequency: 5x/wk OT Frequency: 5x/wk            Contractures Contractures Info: Not  present    Additional Factors Info  Code Status, Allergies, Psychotropic Code Status Info: Full Code Allergies Info: Darvon (Propoxyphene Hcl), Demerol (Meperidine), Penicillins, Imuran (Azathioprine), Sulfa  Antibiotics, Arava (Leflunomide), Lipitor (Atorvastatin ), Vytorin (Ezetimibe-simvastatin) Psychotropic Info: Cymbalta  40mg  po daily         Current Medications (11/17/2023):  This is the current hospital active medication list Current Facility-Administered Medications  Medication Dose Route Frequency Provider Last Rate Last Admin   0.9 %  sodium chloride  infusion   Intravenous Continuous Hill, Avery S, PA-C 75 mL/hr at 11/17/23 0826 Infusion Verify at 11/17/23 9173   acetaminophen  (TYLENOL ) tablet 650 mg  650 mg Oral Q6H PRN Leigh Valery RAMAN, PA-C   650 mg at 11/14/23 9185   Or   acetaminophen  (TYLENOL ) suppository 650 mg  650 mg Rectal Q6H PRN Hill, Avery S, PA-C       artificial tears ophthalmic solution 1 drop  1 drop Both Eyes PRN Chavez, Abigail, NP   1 drop at 11/13/23 2245   aspirin  chewable tablet 81 mg  81 mg Oral BID Leigh Valery RAMAN, PA-C   81 mg at 11/17/23 9050   atorvastatin  (LIPITOR) tablet 10 mg  10 mg Oral Daily Leigh Valery RAMAN, PA-C   10 mg at 11/17/23 0949   bisacodyl  (DULCOLAX) suppository 10 mg  10 mg Rectal Daily PRN Hill, Avery S, PA-C       diphenhydrAMINE  (BENADRYL ) 12.5 MG/5ML elixir 12.5-25 mg  12.5-25 mg Oral Q4H PRN Hill, Avery S, PA-C   25 mg at 11/16/23 0458   docusate sodium  (COLACE) capsule 100 mg  100 mg Oral Daily Leigh Valery RAMAN, PA-C   100 mg at 11/17/23 9050   DULoxetine  (CYMBALTA ) DR capsule 40 mg  40 mg Oral QHS HillValery RAMAN, PA-C   40 mg at 11/16/23 2105   feeding supplement (ENSURE PLUS HIGH PROTEIN) liquid 237 mL  237 mL Oral BID BM Hill, Avery S, PA-C   237 mL at 11/13/23 1034   HYDROcodone -acetaminophen  (NORCO) 7.5-325 MG per tablet 1-2 tablet  1-2 tablet Oral Q4H PRN Leigh Valery RAMAN, PA-C   1 tablet at 11/17/23 0345   HYDROcodone -acetaminophen   (NORCO/VICODIN) 5-325 MG per tablet 1-2 tablet  1-2 tablet Oral Q4H PRN Leigh Valery RAMAN, PA-C   1 tablet at 11/17/23 0950   leptospermum manuka honey (MEDIHONEY) paste 1 Application  1 Application Topical Daily Leigh Valery RAMAN, PA-C   1 Application at 11/17/23 0950   menthol -cetylpyridinium (CEPACOL) lozenge 3 mg  1 lozenge Oral PRN Leigh Valery RAMAN, PA-C       Or   phenol (CHLORASEPTIC) mouth spray 1 spray  1 spray Mouth/Throat PRN HillValery RAMAN, PA-C       methocarbamol  (ROBAXIN ) tablet 500 mg  500 mg Oral Q6H PRN Leigh Valery RAMAN, PA-C   500 mg at 11/16/23 9541   Or   methocarbamol  (ROBAXIN ) injection 500 mg  500 mg Intravenous Q6H PRN Hill, Avery S, PA-C       metoCLOPramide  (REGLAN ) tablet 5-10 mg  5-10 mg Oral Q8H PRN Hill, Avery S, PA-C       Or   metoCLOPramide  (  REGLAN ) injection 5-10 mg  5-10 mg Intravenous Q8H PRN Hill, Avery S, PA-C       morphine  (PF) 2 MG/ML injection 0.5-1 mg  0.5-1 mg Intravenous Q2H PRN Hill, Avery S, PA-C       ondansetron  (ZOFRAN ) tablet 4 mg  4 mg Oral Q6H PRN Hill, Avery S, PA-C       Or   ondansetron  (ZOFRAN ) injection 4 mg  4 mg Intravenous Q6H PRN Leigh Valery RAMAN, PA-C   4 mg at 11/16/23 9282   Oral care mouth rinse  15 mL Mouth Rinse PRN Drusilla Sabas RAMAN, MD       oxybutynin  (DITROPAN ) tablet 5 mg  5 mg Oral Daily Leigh Valery RAMAN, PA-C   5 mg at 11/17/23 9050   pantoprazole  (PROTONIX ) EC tablet 40 mg  40 mg Oral BID AC Hill, Avery S, PA-C   40 mg at 11/17/23 9050   polyethylene glycol (MIRALAX  / GLYCOLAX ) packet 17 g  17 g Oral Daily PRN Hill, Avery S, PA-C       predniSONE  (DELTASONE ) tablet 5 mg  5 mg Oral Q breakfast Drusilla Sabas RAMAN, MD   5 mg at 11/17/23 9050   senna (SENOKOT) tablet 8.6 mg  1 tablet Oral BID Leigh Valery S, PA-C   8.6 mg at 11/17/23 9050     Discharge Medications: Please see discharge summary for a list of discharge medications.  Relevant Imaging Results:  Relevant Lab Results:   Additional Information SSN: [id number]  Alfonse JONELLE Rex,  RN

## 2023-11-18 ENCOUNTER — Telehealth (INDEPENDENT_AMBULATORY_CARE_PROVIDER_SITE_OTHER): Payer: Self-pay | Admitting: Audiology

## 2023-11-18 DIAGNOSIS — S72009A Fracture of unspecified part of neck of unspecified femur, initial encounter for closed fracture: Secondary | ICD-10-CM | POA: Diagnosis not present

## 2023-11-18 NOTE — Progress Notes (Addendum)
 PROGRESS NOTE  Sherri [name]  FMW:[id number] DOB: 1940/06/27 DOA: 11/10/2023 PCP: [name] PARAS, MD   Brief Narrative: 83 y.o. female with medical history significant of anxiety, GERD, rheumatoid arthritis, hypertension, multiple sclerosis, lipidemia who presented to the emergency department due to hip pain.  She had a recent fall resulting in right femoral neck fracture and she underwent right total hip replacement on 11/12/2023.  While working with physical therapy, she was noted to have shortening through her right lower extremity.  X-ray showed a right hip dislocation.  Orthopedics consulted.  She is status post closed reduction of the right total hip replacement.  PT recommended SNF on discharge but patient and family wants to go home with home health.  Patient also had a wound on her right elbow since she fell, she was advised to see Dr. [name] as an outpatient without any plan for surgical intervention.  Examination of the right elbow shows skin opening with visualization of possible underlying bone.  Orthopedics has been is consulted, awaiting for further recommendation  Assessment & Plan:  Principal Problem:   Hip fracture (HCC)   Dislocation of total right hip arthroplasty; found on 11/15/2023 -Underwent closed reduction of total hip replacement per orthopedics  WBAT with walker and hip abduction brace. No active hip abduction for 6 weeks. Posterior hip precautions.   Right impacted hip fracture -Subacute displaced right femoral neck fracture, status post total hip arthroplasty Aspirin  81 mg p.o. twice daily for 45 days for DVT prophylaxis  Right elbow wound:Patient also had a wound on her right elbow since she fell on May.imaging showed acute transverse olecranon fracture, orthopedic surgery was consulted, recommendation was to keep the elbow in a splint, nonweightbearing and follow-up with  Dr. [name] as an outpatient without any plan for surgical intervention.  Wound care was  recommended  examination of the right elbow shows skin opening with visualization of possible underlying bone.  Orthopedics has been is consulted, awaiting for further recommendation   Anemia Hemoglobin dropped to 6.5  -Status post 1 unit PRBC, hemoglobin is up to 8.3   Chronic pain -continue Cymbalta  As needed fentanyl    Urinary retention -continue oxybutynin    GERD -continue Protonix    Rheumatoid arthritis -continue prednisone  -Has orthostatic hypotension, prednisone  was not started after she was was given hydrocortisone  stress dose - Restarted prednisone   5 mg daily   Hyperlipidemia -continue statin   Hypokalemia - Repleted   Hypotension - Resolved -Likely in setting of adrenal insufficiency in setting of surgery - Continue to hold Coreg  - Continue prednisone  5 mg daily Received hydrocortisone  25 mg IV x 4 doses for stress dose    Hyponatremia, chronic - Sodium is improving; back to baseline        DVT prophylaxis:SCDs Start: 11/12/23 2006 Place TED hose Start: 11/12/23 2006 SCDs Start: 11/10/23 2223     Code Status: Full Code  Family Communication: None at bedside  Patient status:Inpatient  Patient is from :Home  Anticipated discharge un:Ynfz  Estimated DC date:tomorrow   Consultants: Orthopedics  Procedures: Closed reduction  Antimicrobials:  Anti-infectives (From admission, onward)    Start     Dose/Rate Route Frequency Ordered Stop   11/16/23 0612  ceFAZolin  (ANCEF ) 2-4 GM/100ML-% IVPB       Note to Pharmacy: Viann Perkins M: cabinet override      11/16/23 0612 11/16/23 1829   11/13/23 0600  vancomycin  (VANCOCIN ) IVPB 1000 mg/200 mL premix        1,000 mg 200  mL/hr over 60 Minutes Intravenous On call to O.R. 11/12/23 1431 11/13/23 0641   11/12/23 2200  ceFAZolin  (ANCEF ) IVPB 2g/100 mL premix        2 g 200 mL/hr over 30 Minutes Intravenous Every 6 hours 11/12/23 2006 11/13/23 0430   11/12/23 2200  vancomycin  (VANCOCIN ) IVPB 1000  mg/200 mL premix        1,000 mg 200 mL/hr over 60 Minutes Intravenous  Once 11/12/23 2006 11/13/23 0040   11/12/23 1442  sodium chloride  0.9 % with cefTRIAXone  (ROCEPHIN ) ADS Med  Status:  Discontinued       Note to Pharmacy: [name] W: cabinet override      11/12/23 1442 11/12/23 1449       Subjective: Patient seen and examined at the bedside today.  Hemodynamically stable.  Comfortably lying in the bed.  She is eager to go home.  Right elbow wound examined.  There is no sanguinous or purulent drainage.  She has right elbow skin tear with possible visualization of underlying bone.  Complains of pain.  No Overlying edema or erythema.  Objective: Vitals:   11/17/23 1308 11/17/23 2210 11/18/23 0548 11/18/23 1310  BP: 127/67 (!) 141/75 138/89 138/74  Pulse: (!) 101 96 (!) 108 (!) 107  Resp: 17 17 17 16   Temp: 98.3 F (36.8 C) 98.4 F (36.9 C) 98 F (36.7 C) 98.4 F (36.9 C)  TempSrc:  Oral Oral Oral  SpO2: 98% 97% 100% 99%  Weight:      Height:        Intake/Output Summary (Last 24 hours) at 11/18/2023 1454 Last data filed at 11/18/2023 1311 Gross per 24 hour  Intake 1522.76 ml  Output 2300 ml  Net -777.24 ml   Filed Weights   11/10/23 2258 11/16/23 0613  Weight: 48.6 kg 48.6 kg    Examination:  General exam: Overall comfortable, not in distress HEENT: PERRL Respiratory system:  no wheezes or crackles  Cardiovascular system: S1 & S2 heard, RRR.  Gastrointestinal system: Abdomen is nondistended, soft and nontender. Central nervous system: Alert and oriented Extremities: No edema, no clubbing ,no cyanosis, skin tear on the right elbow Skin: No rashes, no ulcers,no icterus     Data Reviewed: I have personally reviewed following labs and imaging studies  CBC: Recent Labs  Lab 11/12/23 0354 11/14/23 0335 11/14/23 0720 11/15/23 0317 11/17/23 0347  WBC 9.4 12.7* 12.4* 8.5 8.5  HGB 10.3* 6.5* 6.6* 9.2* 8.3*  HCT 31.8* 20.6* 20.3* 27.9* 25.9*  MCV 108.9*  109.6* 107.4* 102.2* 105.3*  PLT 263 248 276 257 289   Basic Metabolic Panel: Recent Labs  Lab 11/12/23 0354 11/13/23 0335 11/14/23 0335 11/15/23 0317 11/17/23 0347  NA 126* 131* 130* 132* 134*  K 3.2* 4.5 4.3 3.7 4.2  CL 89* 95* 95* 97* 100  CO2 28 28 26 26 25   GLUCOSE 97 160* 137* 98 92  BUN 10 7* 11 11 8   CREATININE 0.60 0.56 0.75 0.69 0.71  CALCIUM  7.8* 7.6* 7.8* 8.1* 8.0*     Recent Results (from the past 240 hours)  Surgical PCR screen     Status: Abnormal   Collection Time: 11/12/23 12:24 AM   Specimen: Nasal Mucosa; Nasal Swab  Result Value Ref Range Status   MRSA, PCR POSITIVE (A) NEGATIVE Final    Comment: RESULT CALLED TO, READ BACK BY AND VERIFIED WITH: HALL, J. 0940 11/12/23 BY JE    Staphylococcus aureus POSITIVE (A) NEGATIVE Final    Comment:  RESULT CALLED TO, READ BACK BY AND VERIFIED WITH: HALL, J. 0940 11/12/23 BY JE (NOTE) The Xpert SA Assay (FDA approved for NASAL specimens in patients 89 years of age and older), is one component of a comprehensive surveillance program. It is not intended to diagnose infection nor to guide or monitor treatment. Performed at [facility], [address], [place], [place] [address]      Radiology Studies: No results found.  Scheduled Meds:  aspirin   81 mg Oral BID   atorvastatin   10 mg Oral Daily   docusate sodium   100 mg Oral Daily   DULoxetine   40 mg Oral QHS   feeding supplement  237 mL Oral BID BM   leptospermum manuka honey  1 Application Topical Daily   oxybutynin   5 mg Oral Daily   pantoprazole   40 mg Oral BID AC   predniSONE   5 mg Oral Q breakfast   senna  1 tablet Oral BID   Continuous Infusions:  sodium chloride  Stopped (11/17/23 1502)     LOS: 7 days   Ivonne Mustache, MD [facility] P6/25/2025, 2:54 PM

## 2023-11-18 NOTE — Progress Notes (Signed)
 Patient sleeping soundly, in no acute distress, caregiver at bedside, will monitor.

## 2023-11-18 NOTE — Progress Notes (Signed)
    Subjective:  Patient reports pain as mild.  Denies N/V/CP/SOB/Abd pain. She reports her hip is doing well. She is not having much pain.  Asked by hospitalist to see her right elbow, under the care of Dr. [name]. She now has soft tissue compromise over her elbow with bone exposure.   Objective:   VITALS:   Vitals:   11/17/23 1308 11/17/23 2210 11/18/23 0548 11/18/23 1310  BP: 127/67 (!) 141/75 138/89 138/74  Pulse: (!) 101 96 (!) 108 (!) 107  Resp: 17 17 17 16   Temp: 98.3 F (36.8 C) 98.4 F (36.9 C) 98 F (36.7 C) 98.4 F (36.9 C)  TempSrc:  Oral Oral Oral  SpO2: 98% 97% 100% 99%  Weight:      Height:        NAD Neurologically intact ABD soft Neurovascular intact Sensation intact distally Intact pulses distally Dorsiflexion/Plantar flexion intact Incision: dressing C/D/I No cellulitis present Compartment soft Hip abduction brace.   Examination of the right elbow reveals bone exposure over the olecranon, and soft tissue exposure over posterior forearm. No pain with active or passive ROM. Palpable distal pulses Sensory and motor function intact. Capillary refill <2 seconds.  See media.   Lab Results  Component Value Date   WBC 8.5 11/17/2023   HGB 8.3 (L) 11/17/2023   HCT 25.9 (L) 11/17/2023   MCV 105.3 (H) 11/17/2023   PLT 289 11/17/2023   BMET    Component Value Date/Time   NA 134 (L) 11/17/2023 0347   NA 139 06/20/2019 1335   NA 136 02/22/2015 0000   K 4.2 11/17/2023 0347   K 4.4 02/22/2015 0000   CL 100 11/17/2023 0347   CL 98 02/22/2015 0000   CO2 25 11/17/2023 0347   CO2 30 02/22/2015 0000   GLUCOSE 92 11/17/2023 0347   BUN 8 11/17/2023 0347   BUN 10 06/20/2019 1335   CREATININE 0.71 11/17/2023 0347   CREATININE 1.03 (H) 08/06/2023 1510   CALCIUM  8.0 (L) 11/17/2023 0347   CALCIUM  9.6 02/22/2015 0000   EGFR 55 (L) 08/06/2023 1510   GFRNONAA >60 11/17/2023 0347   GFRNONAA 56 (L) 09/26/2020 1536     Assessment/Plan: 2 Days Post-Op    Principal Problem:   Hip fracture (HCC)   WBAT with walker, hip abduction brace, posterior hip precautions.  DVT ppx: Aspirin , SCDs, TEDS PO pain control PT/OT Dispo:  - Patient under care of the medical team, plan was for d/c home. She has pain medication printed in chart for her hip.  - Soft tissue compromise right elbow. Spoke with Dr. [name]. Plan to transfer to [facility] for surgery Friday. Discussed plan with hospitalist.  - She can follow-up in office 2 weeks PO for her right hip follow-up.    Valery GORMAN Potters 11/18/2023, 6:09 PM   [facility]  [facility] [address], [address], [place], [place] [address] Phone: [phone] [email or web address] Facebook  [employer]

## 2023-11-18 NOTE — Telephone Encounter (Signed)
 Hello!   Would you be able to call the husband and have him pick up the charger. I had it available. No charge at pick up, but need to be on schedule to document that we gave them a charger. I hope she gets better soon too!

## 2023-11-18 NOTE — Telephone Encounter (Signed)
 Husband of the patient, Sherri [name], called on behalf of the patient who is in the hospital at this time.  She has lost her hearing aid charger and they would like to know if you have any in stock or if one can be ordered.  She wears Phonak hearing aids that were purchased during the time  Dr. Karis had his private practice.  Please advise.  Best phone # to reach them is [phone].

## 2023-11-18 NOTE — TOC Progression Note (Signed)
 Transition of Care [facility]) - Progression Note    Patient Details  Name: Sherri [name]  ARACELYS GLADE MRN: [id number] Date of Birth: 04/24/1941  [facility]) CM/SW Contact  Alfonse JONELLE Rex, RN Phone Number: 11/18/2023, 10:35 AM  Clinical Narrative: PT eval completed, recommendation for short term rehab.SNF. Met with patient and son at bedside, son states will need to discuss with patient's spouse. NCM called patient's spouse while at bedside, spouse declines short term rehab, prefers [facility] PT[facility] with [facility], states she will make arrangements to cover daily therapy if needed, prefers [facility]. Team notified. Spouse prefers [facility], [place] w/[facility] notified.        Expected Discharge Plan: Home w Home Health Services Barriers to Discharge: Continued Medical Work up  Expected Discharge Plan and Services       Living arrangements for the past 2 months: Single Family Home                                       Social Determinants of Health (SDOH) Interventions SDOH Screenings   Food Insecurity: No Food Insecurity (11/10/2023)  Housing: Low Risk  (11/10/2023)  Transportation Needs: No Transportation Needs (11/10/2023)  Utilities: Not At Risk (11/10/2023)  Alcohol  Screen: Low Risk  (02/21/2022)  Depression (PHQ2-9): Low Risk  (07/27/2023)  Financial Resource Strain: Low Risk  (02/21/2022)  Physical Activity: Inactive (02/21/2022)  Social Connections: Socially Integrated (11/10/2023)  Recent Concern: Social Connections - Moderately Isolated (10/03/2023)  Stress: Stress Concern Present (02/21/2022)  Tobacco Use: Medium Risk (11/16/2023)    Readmission Risk Interventions     No data to display

## 2023-11-18 NOTE — Plan of Care (Signed)
  Problem: Health Behavior/Discharge Planning: Goal: Ability to manage health-related needs will improve Outcome: Progressing   Problem: Clinical Measurements: Goal: Ability to maintain clinical measurements within normal limits will improve Outcome: Progressing Goal: Will remain free from infection Outcome: Progressing Goal: Diagnostic test results will improve Outcome: Progressing   Problem: Activity: Goal: Risk for activity intolerance will decrease Outcome: Progressing   Problem: Nutrition: Goal: Adequate nutrition will be maintained Outcome: Progressing   Problem: Coping: Goal: Level of anxiety will decrease Outcome: Progressing   Problem: Elimination: Goal: Will not experience complications related to bowel motility Outcome: Progressing   Problem: Pain Managment: Goal: General experience of comfort will improve and/or be controlled Outcome: Progressing   Problem: Safety: Goal: Ability to remain free from injury will improve Outcome: Progressing   Problem: Skin Integrity: Goal: Risk for impaired skin integrity will decrease Outcome: Progressing   Problem: Education: Goal: Knowledge of the prescribed therapeutic regimen will improve Outcome: Progressing Goal: Understanding of discharge needs will improve Outcome: Progressing   Problem: Activity: Goal: Ability to avoid complications of mobility impairment will improve Outcome: Progressing Goal: Ability to tolerate increased activity will improve Outcome: Progressing   Problem: Clinical Measurements: Goal: Postoperative complications will be avoided or minimized Outcome: Progressing   Problem: Pain Management: Goal: Pain level will decrease with appropriate interventions Outcome: Progressing

## 2023-11-18 NOTE — Progress Notes (Signed)
 Occupational Therapy Treatment Patient Details Name: Sherri [name]  LAQUETTA RACEY MRN: [id number] DOB: Oct 25, 1940 Today's Date: 11/18/2023   History of present illness Pt admitted with R hip fx and now s/p THR.  Pt with hx of fall with non-operative R elbow fx and hip fx not diagnosed until pt in SNF rehab ~ 1 month.  Pt taken home by family and to see PCP who did hip Xray and sent pt directly to [facility] for surgery.  pt underwent closed reduction R THA on 11/16/23.  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   OT comments  Patient seen for skilled OT session this am. New R hip AB brace in place as well as KI for session. Improved patient ability for use of RW with assist for standing and transfers with ongoing cues for posterior R hip prec and abduction prevention. See current status and education performed below. OT will continue to follow to progress functional safety and performance with MD recommendations for follow up post acute care setting.    Post OT departure from session: Secure chat with PA confirmed no longer need for KI and nursing confirmed they will remove. Discussed importantce to monitor R AB brace touch points on skin to maintain integrity.        If plan is discharge home, recommend the following:  Two people to help with walking and/or transfers;A lot of help with bathing/dressing/bathroom;Assistance with cooking/housework;Direct supervision/assist for medications management;Assist for transportation;Help with stairs or ramp for entrance;Direct supervision/assist for financial management   Equipment Recommendations  None recommended by OT       Precautions / Restrictions Precautions Precautions: Fall Precaution/Restrictions Comments: Watch BPs, has been orthostatic Required Braces or Orthoses: Other Brace (R hip AB brace) Knee Immobilizer - Right: On at all times Other Brace: no further need for KI as per secure chat with Valery Potters PA by OT ed on  10/18/23 but now we need to watch skin from new AB brace and nursing alerted to as well Restrictions Weight Bearing Restrictions Per Provider Order: No RLE Weight Bearing Per Provider Order: Weight bearing as tolerated Other Position/Activity Restrictions: THP and no active ABd       Mobility Bed Mobility Overal bed mobility: Needs Assistance Bed Mobility: Supine to Sit     Supine to sit: HOB elevated, Used rails, Max assist     General bed mobility comments: bed pad used to maintain hip alignment    Transfers Overall transfer level: Needs assistance Equipment used: Rolling walker (2 wheels) Transfers: Sit to/from Stand, Bed to chair/wheelchair/BSC Sit to Stand: Min assist, Mod assist, +2 physical assistance, +2 safety/equipment, From elevated surface     Step pivot transfers: Min assist, Mod assist, +2 physical assistance, +2 safety/equipment     General transfer comment: improved ability to weight shift and step with RW this sesison with no reports of pain     Balance Overall balance assessment: Needs assistance Sitting-balance support: Feet supported, No upper extremity supported Sitting balance-Leahy Scale: Fair     Standing balance support: Bilateral upper extremity supported, Reliant on assistive device for balance Standing balance-Leahy Scale: Poor                             ADL either performed or assessed with clinical judgement   ADL Overall ADL's : Needs assistance/impaired Eating/Feeding: Modified independent;Sitting   Grooming: Sitting;Set up;Wash/dry hands;Wash/dry face;Oral care   Upper Body  Bathing: Sitting;Supervision/ safety;Set up   Lower Body Bathing: Total assistance;Sitting/lateral leans   Upper Body Dressing : Set up;Sitting   Lower Body Dressing: Sitting/lateral leans;Total assistance   Toilet Transfer: +2 for physical assistance;+2 for safety/equipment;Moderate assistance;Rolling walker (2 wheels);Stand-pivot;BSC/3in1    Toileting- Architect and Hygiene: Maximal assistance;Sitting/lateral lean       Functional mobility during ADLs: Moderate assistance;+2 for physical assistance;+2 for safety/equipment General ADL Comments: total assist for R AB brace and max cues to recal THA post hip prec    Extremity/Trunk Assessment Upper Extremity Assessment Upper Extremity Assessment: Overall WFL for tasks assessed RUE Deficits / Details: recent non-operative fx of olecranon (10/02/23) - ROM WFL.  Strength slightly decreased vs L, noted to have dressing on posterior elbow.   Lower Extremity Assessment Lower Extremity Assessment: Defer to PT evaluation        Vision   Vision Assessment?: No apparent visual deficits         Communication Communication Communication: Impaired Factors Affecting Communication: Hearing impaired   Cognition Arousal: Alert Behavior During Therapy: WFL for tasks assessed/performed Cognition: History of cognitive impairments             OT - Cognition Comments: mild STM and higher level processing especially with recall of THA precautions due to novel info                 Following commands: Intact        Cueing   Cueing Techniques: Verbal cues, Gestural cues        General Comments post op R hip dressing intact, TEDS in place, OT assessed skin integrity with new AB brace but needs to be frequently assessed and nursing and team notified of risk    Pertinent Vitals/ Pain       Pain Assessment Pain Assessment: Faces Faces Pain Scale: Hurts a little bit Pain Location: R hip/thigh with activity Pain Descriptors / Indicators: Discomfort Pain Intervention(s): Monitored during session, Premedicated before session, Repositioned   Frequency  Min 2X/week        Progress Toward Goals  OT Goals(current goals can now be found in the care plan section)  Progress towards OT goals: Progressing toward goals  Acute Rehab OT Goals Patient Stated Goal:  to keep moving better OT Goal Formulation: With patient Time For Goal Achievement: 11/28/23 Potential to Achieve Goals: Fair ADL Goals Pt Will Perform Lower Body Bathing: with mod assist;with adaptive equipment;sitting/lateral leans Pt Will Perform Lower Body Dressing: with mod assist;with adaptive equipment;sit to/from stand Pt Will Transfer to Toilet: with +2 assist;with min assist;bedside commode Additional ADL Goal #1: patient will adhere to R THA precautions with min cues and written info  Plan         AM-PAC OT 6 Clicks Daily Activity     Outcome Measure   Help from another person eating meals?: None Help from another person taking care of personal grooming?: None Help from another person toileting, which includes using toliet, bedpan, or urinal?: A Lot Help from another person bathing (including washing, rinsing, drying)?: A Lot Help from another person to put on and taking off regular upper body clothing?: A Little Help from another person to put on and taking off regular lower body clothing?: A Lot 6 Click Score: 17    End of Session Equipment Utilized During Treatment: Gait belt;Rolling walker (2 wheels);Other (comment) (AB and KI brace)  OT Visit Diagnosis: Unsteadiness on feet (R26.81);Other abnormalities of gait and mobility (R26.89);Muscle weakness (generalized) (  M62.81)   Activity Tolerance Patient tolerated treatment well   Patient Left in chair;with call bell/phone within reach;with chair alarm set   Nurse Communication Mobility status;Weight bearing status;Precautions;Other (comment) (nursing to remove KI if PA confirms as well as monitor skin at brace touch points)        Time: 9041-8976 OT Time Calculation (min): 25 min  Charges: OT General Charges $OT Visit: 1 Visit OT Treatments $Self Care/Home Management : 8-22 mins $Therapeutic Activity: 8-22 mins  Magnolia Mattila OT/L Acute Rehabilitation Department  [phone]  11/18/2023, 11:38 AM

## 2023-11-19 DIAGNOSIS — S72009A Fracture of unspecified part of neck of unspecified femur, initial encounter for closed fracture: Secondary | ICD-10-CM | POA: Diagnosis not present

## 2023-11-19 MED ORDER — CARVEDILOL 3.125 MG PO TABS
1.5625 mg | ORAL_TABLET | Freq: Two times a day (BID) | ORAL | Status: DC
  Administered 2023-11-19 – 2023-11-20 (×2): 1.5625 mg via ORAL
  Filled 2023-11-19 (×2): qty 1

## 2023-11-19 MED ORDER — POVIDONE-IODINE 10 % EX SWAB: 2.0000 | Freq: Once | CUTANEOUS | Status: DC

## 2023-11-19 MED ORDER — CHLORHEXIDINE GLUCONATE 4 % EX SOLN
60.0000 mL | Freq: Once | CUTANEOUS | Status: AC
  Administered 2023-11-20: 4 via TOPICAL
  Filled 2023-11-19: qty 60

## 2023-11-19 NOTE — Progress Notes (Signed)
 Pt transported to [facility] via [facility] on stretcher. Pt left with all her belongings including bilateral hearing aids, cell phone, charger and $20 in case box. Report given to the receiving nurse Rudell, RN. Daughter Sherri [name] notified about the transfer.

## 2023-11-19 NOTE — TOC Progression Note (Signed)
 Transition of Care [facility]) - Progression Note    Patient Details  Name: Sherri [name] MRN: [id number] Date of Birth: 01/17/1941  Transition of Care [facility]) CM/SW Contact  Alfonse JONELLE Rex, RN Phone Number: 11/19/2023, 9:55 AM  Clinical Narrative: Plan transfer to [facility] on Friday, 6/27 for elbow surgery.       Expected Discharge Plan: Home w Home Health Services Barriers to Discharge: Continued Medical Work up  Expected Discharge Plan and Services       Living arrangements for the past 2 months: Single Family Home                                       Social Determinants of Health (SDOH) Interventions SDOH Screenings   Food Insecurity: No Food Insecurity (11/10/2023)  Housing: Low Risk  (11/10/2023)  Transportation Needs: No Transportation Needs (11/10/2023)  Utilities: Not At Risk (11/10/2023)  Alcohol  Screen: Low Risk  (02/21/2022)  Depression (PHQ2-9): Low Risk  (07/27/2023)  Financial Resource Strain: Low Risk  (02/21/2022)  Physical Activity: Inactive (02/21/2022)  Social Connections: Socially Integrated (11/10/2023)  Recent Concern: Social Connections - Moderately Isolated (10/03/2023)  Stress: Stress Concern Present (02/21/2022)  Tobacco Use: Medium Risk (11/16/2023)    Readmission Risk Interventions     No data to display

## 2023-11-19 NOTE — H&P (View-Only) (Signed)
 Reason for Consult:Right open olecranon fx Referring Physician: Ivonne Mustache Time called: 1119 Time at bedside: 1135   Sherri [name] is an 83 y.o. female.  HPI: Sherri [name]  fell and suffered an olecranon fx on 5/14. We initially planned on non-operative management but her displacement widened and Sherri [name] developed a bone-exposing ulceration over the fracture. Sherri [name] was readmitted 6/17 s/p fall and hip fxs and underwent subsequent repair. Referral was made to Dr. [name] as surgeon of record for the elbow to address. Sherri [name] is RHD.  Past Medical History:  Diagnosis Date   Anxiety    takes Valium  daily as needed   Basal cell carcinoma 01/21/1988   left nostril (MOHS), sup-right calf (CX35FU)   Basal cell carcinoma 03/22/1996   right calf (CX35FU)   Basal cell carcinoma 03/31/1995   left wing nose   Bruises easily    d/t meds   Cancer (HCC)    basal cell ca, in situ- uterine    Cataracts, bilateral    removed bilateral   Chronic back pain    Dizziness    r/t to meds   GERD (gastroesophageal reflux disease)    no meds on a regular basis but will take Tums if needed   Headache(784.0)    r/t neck issues   History of bronchitis 6-54yrs ago   History of colon polyps    benign   HOH (hard of hearing)    wears hearing aids   Hyperlipidemia    takes Atorvastatin  on Mondays and Fridays   Hypertension    Joint pain    Joint swelling    Multiple sclerosis (HCC)    Neuromuscular disorder (HCC)    Dr. FABIENE Crete- [facility], follows [facility]   Nocturia    PONV (postoperative nausea and vomiting)    trouble urinating after surgery in 2014   Postoperative nausea and vomiting 01/11/2019   Rheumatoid arthritis (HCC)    Dr Lorrayne weekly, RA- hands- knees- feet    Rheumatoid arthritis(714.0)    Dr Dolphus takes Xeljanz  daily   Right wrist fracture    Skin cancer    Squamous cell carcinoma of skin 11/02/2001   in situ-right knee (cx18fu)   Squamous cell carcinoma of skin  11/12/2011   in situ-left forearm (CX35FU), in situ-left foot (CX35FU)   Squamous cell carcinoma of skin 01/22/2012   in situ-left lower forearm (txpbx)   Squamous cell carcinoma of skin 11/17/2012   right shin (txpbx)   Squamous cell carcinoma of skin 10/28/2013   in situ-right shoulder (CX35FU)   Squamous cell carcinoma of skin 04/28/2014   well diff-right shin (txpbx)   Squamous cell carcinoma of skin 11/02/2014   in situ-Left shin (txpbx)   Squamous cell carcinoma of skin 12/15/2014   in situ-Left hand (txpbx), bowens-left side chest (txpbx)   Squamous cell carcinoma of skin 05/09/2015   in situ-left hand (txpbx)    Squamous cell carcinoma of skin 05/07/2016   in situ-left inner shin,ant, in situ-left inner shin, post, in situ-left outer forearm, in situ-right knuckle   Squamous cell carcinoma of skin 03/1302018   in situ-left shoulder (txpbx), in situ-right inner shin (txpbx), in situ-top of left foot (txpbx), KA- left forearm (txpbx)   Squamous cell carcinoma of skin 12/02/2016   in situ-left inner shin (txpbx)   Squamous cell carcinoma of skin 03/04/2017   in situ-left outer sup, shin (txpbx), in situ- right 2nd knuckle finger (txpbx), in situ- Left wrist (txpbx)   Squamous cell carcinoma  of skin 04/06/2018   in situ-above left knee inner (txpbx)   Squamous cell carcinoma of skin 04/21/2018   in situ-right top hand (txpbx)   Squamous cell carcinoma of skin 07/08/2018   in situ-right lower inner shin (txpbx)   Squamous cell carcinoma of skin 04/27/2019   in situ- Left neck(CX35FU), In situ- right neck (CX35FU)   Urinary retention    sees Dr.Wrenn about 2 times a yr    Past Surgical History:  Procedure Laterality Date   ABDOMINAL HYSTERECTOMY     1985   ANTERIOR CERVICAL DECOMP/DISCECTOMY FUSION N/A 04/23/2022   Procedure: Anterior Cervical Decompression/Discectomy Fusion - Cervical Seven-Thoracic One;  Surgeon: Louis Shove, MD;  Location: [facility] OR;  Service: Neurosurgery;   Laterality: N/A;  3C   APPENDECTOMY     with TAH   BACK SURGERY     several   BREAST BIOPSY Right    Results were negative   BROW LIFT Bilateral 10/02/2016   Procedure: BILATERAL LOWER LID BLEPHAROPLASTY;  Surgeon: Laurie Loyd Redhead, MD;  Location: [facility] OR;  Service: Plastics;  Laterality: Bilateral;   CARPAL TUNNEL RELEASE Right    cataracts     CERVICAL FUSION     Dr Leeann   CERVICAL FUSION  12/31/2012   Dr Leeann   CERVICAL FUSION  04/2022   CHEST TUBE INSERTION     for traumatic Pneumothorax   COLONOSCOPY  07/16/2010   normal    COLONOSCOPY W/ POLYPECTOMY  1997   negative since; Dr Debrah   ESOPHAGOGASTRODUODENOSCOPY  07/16/2010   normal   eye lid raise     EYE SURGERY Bilateral    cataracts removed - /w IOL   HARDWARE REMOVAL  08/2021   HIP CLOSED REDUCTION Right 11/16/2023   Procedure: CLOSED MANIPULATION, JOINT, HIP;  Surgeon: Ernie Cough, MD;  Location: [facility] ORS;  Service: Orthopedics;  Laterality: Right;   JOINT REPLACEMENT     LUMBAR FUSION     Dr Leeann   NASAL SINUS SURGERY     POSTERIOR CERVICAL FUSION/FORAMINOTOMY N/A 12/31/2012   Procedure: POSTERIOR LATERAL CERVICAL FUSION/FORAMINOTOMY LEVEL 1 CERVICAL THREE-FOUR WITH LATERAL MASS SCREWS;  Surgeon: Catalina CHRISTELLA Leeann, MD;  Location: [facility];  Service: Neurosurgery;  Laterality: N/A;   PTOSIS REPAIR Bilateral 10/02/2016   Procedure: INTERNAL PTOSIS REPAIR;  Surgeon: Laurie Loyd Redhead, MD;  Location: [facility] OR;  Service: Plastics;  Laterality: Bilateral;   SKIN BIOPSY     THYROID  SURGERY     R lobe removed- 1972, has grown back - CT last done- 2018   TOTAL HIP ARTHROPLASTY  12/22/2011   Procedure: TOTAL HIP ARTHROPLASTY;  Surgeon: Dempsey JINNY Sensor, MD;  Location: [facility] OR;  Service: Orthopedics;  Laterality: Left;   TOTAL HIP ARTHROPLASTY Right 11/12/2023   Procedure: ARTHROPLASTY, HIP, TOTAL, ANTERIOR APPROACH;  Surgeon: Fidel Rogue, MD;  Location: [facility] OR[facility];  Service: Orthopedics;  Laterality: Right;   TOTAL  SHOULDER ARTHROPLASTY     TUBAL LIGATION     UPPER GASTROINTESTINAL ENDOSCOPY  2012   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   WRIST SURGERY Left 09/16/2022   wrist/arm    Family History  Problem Relation Age of Onset   Cancer Mother        pancreatic   Diabetes Mother    Pancreatic cancer Mother    Heart disease Father        Rheumatic   Cancer Father        ? stomach  Stomach cancer Father    Cancer Sister        stomach   Stomach cancer Sister    Cancer Maternal Aunt        X 4; ? primary   Heart disease Paternal Aunt    Cancer Maternal Grandmother        cervical   Colon cancer Other        Aunts   Multiple sclerosis Daughter    Hypertension Neg Hx    Stroke Neg Hx    Esophageal cancer Neg Hx    Rectal cancer Neg Hx     Social History:  reports that Sherri [name] quit smoking about 42 years ago. Her smoking use included cigarettes. Sherri [name] started smoking about 62 years ago. Sherri [name] has a 50 pack-year smoking history. Sherri [name] has never been exposed to tobacco smoke. Sherri [name] has never used smokeless tobacco. Sherri [name] reports current alcohol  use of about 14.0 standard drinks of alcohol  per week. Sherri [name] reports that Sherri [name] does not use drugs.  Allergies:  Allergies  Allergen Reactions   Darvon [Propoxyphene Hcl] Shortness Of Breath    ? Dose Related ? Lowered respirations greatly   Demerol [Meperidine] Shortness Of Breath and Other (See Comments)    Respiratory Distress   Penicillins Hives and Other (See Comments)    Has patient had a PCN reaction causing immediate rash, facial/tongue/throat swelling, SOB or lightheadedness with hypotension: No SEVERE RASH INVOLVING MUCUS MEMBRANES or SKIN NECROSIS: #  #  #  YES  #  #  #  Has patient had a PCN reaction that required hospitalization No Has patient had a PCN reaction occurring within the last 10 years: No.    Imuran [Azathioprine] Other (See Comments)    Weakness   Sulfa  Antibiotics Nausea Only and Other (See Comments)    States had severe indigestion  and heartburn and nausea and had to stop taking   Arava [Leflunomide] Other (See Comments)    Excessive weight gain   Lipitor [Atorvastatin ] Nausea And Vomiting    Currently taking 2 times weekly - able to tolerate low dose    Vytorin [Ezetimibe-Simvastatin] Nausea And Vomiting    Medications: I have reviewed the patient's current medications.  No results found. However, due to the size of the patient record, not all encounters were searched. Please check Results Review for a complete set of results.  No results found.  Review of Systems  HENT:  Negative for ear discharge, ear pain, hearing loss and tinnitus.   Eyes:  Negative for photophobia and pain.  Respiratory:  Negative for cough and shortness of breath.   Cardiovascular:  Negative for chest pain.  Gastrointestinal:  Negative for abdominal pain, nausea and vomiting.  Genitourinary:  Negative for dysuria, flank pain, frequency and urgency.  Musculoskeletal:  Positive for arthralgias (Right elbow). Negative for back pain, myalgias and neck pain.  Neurological:  Negative for dizziness and headaches.  Hematological:  Does not bruise/bleed easily.  Psychiatric/Behavioral:  The patient is not nervous/anxious.    Blood pressure 131/89, pulse (!) 109, temperature 98.9 F (37.2 C), resp. rate 14, height 5' 1 (1.549 m), weight 48.6 kg, SpO2 99%. Physical Exam Constitutional:      General: Sherri [name] is not in acute distress.    Appearance: Sherri [name] is well-developed. Sherri [name] is not diaphoretic.  HENT:     Head: Normocephalic and atraumatic.   Eyes:     General: No scleral icterus.       Right eye: No discharge.  Left eye: No discharge.     Conjunctiva/sclera: Conjunctivae normal.    Cardiovascular:     Rate and Rhythm: Normal rate and regular rhythm.  Pulmonary:     Effort: Pulmonary effort is normal. No respiratory distress.   Musculoskeletal:     Cervical back: Normal range of motion.     Comments: Right shoulder, elbow, wrist,  digits- Olecranon ulceration, no instability, no blocks to motion  Sens  Ax/R/M/U intact  Mot   Ax/ R/ PIN/ M/ AIN/ U intact  Rad 2+   Skin:    General: Skin is warm and dry.   Neurological:     Mental Status: Sherri [name] is alert.   Psychiatric:        Mood and Affect: Mood normal.        Behavior: Behavior normal.     Assessment/Plan: Open right elbow fx -- Plan I&D, ORIF tomorrow with Dr. [name] at [facility]. Awaiting transfer. Please keep NPO after MN.    Ozell DOROTHA Ned, PA-C Orthopedic Surgery [phone] 11/19/2023, 12:54 PM

## 2023-11-19 NOTE — Progress Notes (Signed)
 Physical Therapy Treatment Patient Details Name: Sherri [name] MRN: [id number] DOB: [birth year] Today's Date: 11/19/2023   History of Present Illness Pt admitted with R hip fx and now s/p THR.  Pt with hx of fall with non-operative R elbow fx and hip fx not diagnosed until pt in SNF rehab ~ 1 month.  Pt taken home by family and to see PCP who did hip Xray and sent pt directly to [facility] for surgery.  pt underwent closed reduction R THA on 11/16/23.  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    PT Comments  Pt very motivated and eager to get OOB this am but limited by c/o dizziness with position change.  BP sitting 111/68 with HR 122 and standing 103/62  with HR 131 and increasing dizziness.  Pt returned to sitting and assisted to stand pvt from bed to recliner I need to get out of this bed.  RN aware.    If plan is discharge home, recommend the following: A lot of help with walking and/or transfers;A lot of help with bathing/dressing/bathroom;Assistance with cooking/housework;Assist for transportation;Help with stairs or ramp for entrance   Can travel by private vehicle     No  Equipment Recommendations  None recommended by PT    Recommendations for Other Services OT consult     Precautions / Restrictions Precautions Precautions: Fall Precaution/Restrictions Comments: Watch BPs, has been orthostatic Required Braces or Orthoses: Other Brace Knee Immobilizer - Right: On at all times Other Brace: no further need for KI as per secure chat with Valery Potters PA by OT ed on 10/18/23 but now we need to watch skin from new AB brace and nursing alerted to as well Restrictions Weight Bearing Restrictions Per Provider Order: Yes RUE Weight Bearing Per Provider Order: Non weight bearing RLE Weight Bearing Per Provider Order: Weight bearing as tolerated Other Position/Activity Restrictions: posterior THP and no active ABd     Mobility  Bed Mobility Overal bed  mobility: Needs Assistance Bed Mobility: Supine to Sit     Supine to sit: HOB elevated, Used rails, Min assist, Mod assist, +2 for physical assistance     General bed mobility comments: cues for sequence, adherence to THP and use of L LE to self assist,  Physical assist to manage R LE, to control trunk and to complete rotation to EOB with bed pad    Transfers Overall transfer level: Needs assistance Equipment used: Rolling walker (2 wheels) Transfers: Sit to/from Stand, Bed to chair/wheelchair/BSC Sit to Stand: Min assist, Mod assist, +2 physical assistance, +2 safety/equipment, From elevated surface Stand pivot transfers: Mod assist, From elevated surface              Ambulation/Gait               General Gait Details: Stand pvt to chair only - BP dropping and c/o lightheadedness   Stairs             Wheelchair Mobility     Tilt Bed    Modified Rankin (Stroke Patients Only)       Balance Overall balance assessment: Needs assistance Sitting-balance support: Feet supported, No upper extremity supported Sitting balance-Leahy Scale: Fair     Standing balance support: Bilateral upper extremity supported, Reliant on assistive device for balance Standing balance-Leahy Scale: Poor  Communication Communication Communication: Impaired Factors Affecting Communication: Hearing impaired (does well if hearing aids charged)  Cognition Arousal: Alert Behavior During Therapy: WFL for tasks assessed/performed   PT - Cognitive impairments: No apparent impairments                       PT - Cognition Comments: AxO x 3 pleasant Lady.  A litle nervous.  following all commands. Following commands: Intact      Cueing Cueing Techniques: Verbal cues, Gestural cues  Exercises      General Comments        Pertinent Vitals/Pain Pain Assessment Pain Assessment: 0-10 Pain Score: 4  Pain Location: R hip/thigh  with activity Pain Descriptors / Indicators: Discomfort Pain Intervention(s): Limited activity within patient's tolerance, Monitored during session, Premedicated before session, Ice applied    Home Living                          Prior Function            PT Goals (current goals can now be found in the care plan section) Acute Rehab PT Goals Patient Stated Goal: Regain IND PT Goal Formulation: With patient Time For Goal Achievement: 11/20/23 Potential to Achieve Goals: Good Progress towards PT goals: Progressing toward goals    Frequency    7X/week      PT Plan      Co-evaluation              AM-PAC PT 6 Clicks Mobility   Outcome Measure  Help needed turning from your back to your side while in a flat bed without using bedrails?: A Lot Help needed moving from lying on your back to sitting on the side of a flat bed without using bedrails?: A Lot Help needed moving to and from a bed to a chair (including a wheelchair)?: A Lot Help needed standing up from a chair using your arms (e.g., wheelchair or bedside chair)?: A Lot Help needed to walk in hospital room?: A Lot Help needed climbing 3-5 steps with a railing? : Total 6 Click Score: 11    End of Session Equipment Utilized During Treatment: Gait belt Activity Tolerance: Patient limited by fatigue;Other (comment) (dizziness) Patient left: in chair;with call bell/phone within reach;with chair alarm set Nurse Communication: Mobility status PT Visit Diagnosis: Difficulty in walking, not elsewhere classified (R26.2);Muscle weakness (generalized) (M62.81)     Time: 8967-8947 PT Time Calculation (min) (ACUTE ONLY): 20 min  Charges:    $Therapeutic Activity: 8-22 mins PT General Charges $$ ACUTE PT VISIT: 1 Visit                     Katrinka Acton PT Acute Rehabilitation Services Pager [phone] Office [phone]    Carsyn Boster 11/19/2023, 11:48 AM

## 2023-11-19 NOTE — Progress Notes (Signed)
 PROGRESS NOTE  Sherri  [name]  FMW:[id number] DOB: [birth year] DOA: 11/10/2023 PCP: [name] PARAS, MD   Brief Narrative: 83 y.o. female with medical history significant of anxiety, GERD, rheumatoid arthritis, hypertension, multiple sclerosis, lipidemia who presented to the emergency department due to hip pain.  She had a recent fall resulting in right femoral neck fracture and she underwent right total hip replacement on 11/12/2023.  While working with physical therapy, she was noted to have shortening through her right lower extremity.  X-ray showed a right hip dislocation.  Orthopedics consulted.  She is status post closed reduction of the right total hip replacement.   Patient also had a wound on her right elbow since she fell with olecranon fracture, she was advised to see Dr. [name] as an outpatient without any plan for surgical intervention.  Examination of the right elbow on 6/25 showed skin opening with visualization of possible underlying bone.  Orthopedics reconsulted.  Orthopedics planning to operate her at [facility]  Being transferred  Assessment & Plan:  Principal Problem:   Hip fracture (HCC)   Dislocation of total right hip arthroplasty; found on 11/15/2023 Underwent closed reduction of total hip replacement per orthopedics  WBAT with walker and hip abduction brace. No active hip abduction for 6 weeks. Posterior hip precautions.  PT recommended SNF but patient wants to go home with home health  Right impacted hip fracture Subacute displaced right femoral neck fracture, status post total hip arthroplasty Plan for Aspirin  81 mg p.o. twice daily for total of 45 days for DVT prophylaxis  Right elbow wound:Patient also had a wound on her right elbow since she fell on May.imaging showed acute transverse olecranon fracture, orthopedic surgery was consulted at that time, recommendation was to keep the elbow in a splint, nonweightbearing and follow-up with  Dr. [name] as an  outpatient without any plan for surgical intervention.  Wound care was recommended.  Patient never followed with Dr [name] Examination of the right elbow shows skin opening with visualization of possible underlying bone.   Orthopedics reconsulted.  Orthopedics planning to operate her at [facility] tomorrow   Anemia Hemoglobin has dropped to 6.5  Status post 1 unit PRBC, hemoglobin now in the range of 8   Chronic pain Continue Cymbalta    Urinary retention Continue oxybutynin    GERD Continue Protonix    Rheumatoid arthritis Restarted prednisone   5 mg daily   Hyperlipidemia Continue statin   Hypokalemia Repleted, continue monitoring   Hypotension -Likely in setting of adrenal insufficiency in setting of surgery. - Continue prednisone  5 mg daily Now in mild sinus tachycardia, blood pressure trending up.  Restarted home dose Coreg    Hyponatremia, chronic - Sodium is improving; back to baseline        DVT prophylaxis:SCDs Start: 11/12/23 2006 Place TED hose Start: 11/12/23 2006 SCDs Start: 11/10/23 2223     Code Status: Full Code  Family Communication: Son at bedside  Patient status:Inpatient  Patient is from :Home  Anticipated discharge un:Ynfz with Home health  Estimated DC date: 3 days, needs orthopedic clearance   Consultants: Orthopedics  Procedures: Closed reduction  Antimicrobials:  Anti-infectives (From admission, onward)    Start     Dose/Rate Route Frequency Ordered Stop   11/16/23 0612  ceFAZolin  (ANCEF ) 2-4 GM/100ML-% IVPB       Note to Pharmacy: Viann Perkins M: cabinet override      11/16/23 0612 11/16/23 1829   11/13/23 0600  vancomycin  (VANCOCIN ) IVPB 1000 mg/200 mL premix  1,000 mg 200 mL/hr over 60 Minutes Intravenous On call to O.R. 11/12/23 1431 11/13/23 0641   11/12/23 2200  ceFAZolin  (ANCEF ) IVPB 2g/100 mL premix        2 g 200 mL/hr over 30 Minutes Intravenous Every 6 hours 11/12/23 2006 11/13/23 0430   11/12/23 2200  vancomycin   (VANCOCIN ) IVPB 1000 mg/200 mL premix        1,000 mg 200 mL/hr over 60 Minutes Intravenous  Once 11/12/23 2006 11/13/23 0040   11/12/23 1442  sodium chloride  0.9 % with cefTRIAXone  (ROCEPHIN ) ADS Med  Status:  Discontinued       Note to Pharmacy: [name] W: cabinet override      11/12/23 1442 11/12/23 1449       Subjective:  Patient seen and examined at the bedside today.  Hemodynamically stable.  Comfortably lying on bed.  Denies any new complaints.  She understands that she is being transferred to [facility] to operate on her right elbow.  Son at the bedside.  Objective: Vitals:   11/18/23 2159 11/19/23 0640 11/19/23 0835 11/19/23 1055  BP: (!) 140/75 (!) 153/88 118/62 131/89  Pulse: 100 (!) 109 (!) 108 (!) 109  Resp: 16 16 14    Temp: 97.9 F (36.6 C) 98.3 F (36.8 C) 98.9 F (37.2 C)   TempSrc: Oral Oral    SpO2: 96% 97% 99%   Weight:      Height:        Intake/Output Summary (Last 24 hours) at 11/19/2023 1133 Last data filed at 11/19/2023 0906 Gross per 24 hour  Intake 240 ml  Output 1300 ml  Net -1060 ml   Filed Weights   11/10/23 2258 11/16/23 0613  Weight: 48.6 kg 48.6 kg    Examination:  General exam: Overall comfortable, not in distress HEENT: PERRL Respiratory system:  no wheezes or crackles  Cardiovascular system: S1 & S2 heard, RRR.  Gastrointestinal system: Abdomen is nondistended, soft and nontender. Central nervous system: Alert and oriented Extremities: No edema, no clubbing ,no cyanosis, skin tear on the right elbow Skin: No rashes, no ulcers,no icterus     Data Reviewed: I have personally reviewed following labs and imaging studies  CBC: Recent Labs  Lab 11/14/23 0335 11/14/23 0720 11/15/23 0317 11/17/23 0347  WBC 12.7* 12.4* 8.5 8.5  HGB 6.5* 6.6* 9.2* 8.3*  HCT 20.6* 20.3* 27.9* 25.9*  MCV 109.6* 107.4* 102.2* 105.3*  PLT 248 276 257 289   Basic Metabolic Panel: Recent Labs  Lab 11/13/23 0335 11/14/23 0335  11/15/23 0317 11/17/23 0347  NA 131* 130* 132* 134*  K 4.5 4.3 3.7 4.2  CL 95* 95* 97* 100  CO2 28 26 26 25   GLUCOSE 160* 137* 98 92  BUN 7* 11 11 8   CREATININE 0.56 0.75 0.69 0.71  CALCIUM  7.6* 7.8* 8.1* 8.0*     Recent Results (from the past 240 hours)  Surgical PCR screen     Status: Abnormal   Collection Time: 11/12/23 12:24 AM   Specimen: Nasal Mucosa; Nasal Swab  Result Value Ref Range Status   MRSA, PCR POSITIVE (A) NEGATIVE Final    Comment: RESULT CALLED TO, READ BACK BY AND VERIFIED WITH: HALL, J. 0940 11/12/23 BY JE    Staphylococcus aureus POSITIVE (A) NEGATIVE Final    Comment: RESULT CALLED TO, READ BACK BY AND VERIFIED WITH: HALL, J. 0940 11/12/23 BY JE (NOTE) The Xpert SA Assay (FDA approved for NASAL specimens in patients 5 years of age and  older), is one component of a comprehensive surveillance program. It is not intended to diagnose infection nor to guide or monitor treatment. Performed at [facility], [address] [place], [place] [address]      Radiology Studies: No results found.  Scheduled Meds:  aspirin   81 mg Oral BID   atorvastatin   10 mg Oral Daily   carvedilol   1.5625 mg Oral BID WC   docusate sodium   100 mg Oral Daily   DULoxetine   40 mg Oral QHS   feeding supplement  237 mL Oral BID BM   leptospermum manuka honey  1 Application Topical Daily   oxybutynin   5 mg Oral Daily   pantoprazole   40 mg Oral BID AC   predniSONE   5 mg Oral Q breakfast   senna  1 tablet Oral BID   Continuous Infusions:     LOS: 8 days   Ivonne Mustache, MD [facility] P6/26/2025, 11:33 AM

## 2023-11-19 NOTE — Plan of Care (Signed)
  Problem: Coping: Goal: Level of anxiety will decrease Outcome: Progressing   Problem: Pain Managment: Goal: General experience of comfort will improve and/or be controlled Outcome: Progressing   Problem: Elimination: Goal: Will not experience complications related to bowel motility Outcome: Progressing

## 2023-11-19 NOTE — Consult Note (Signed)
 Reason for Consult:Right open olecranon fx Referring Physician: Ivonne Mustache Time called: 1119 Time at bedside: 1135   Sherri [name] is an 83 y.o. female.  HPI: Sherri [name]  fell and suffered an olecranon fx on 5/14. We initially planned on non-operative management but Sherri [name] displacement widened and Sherri [name] developed a bone-exposing ulceration over the fracture. Sherri [name] was readmitted 6/17 s/p fall and hip fxs and underwent subsequent repair. Referral was made to Dr. [name] as surgeon of record for the elbow to address. Sherri [name] is RHD.  Past Medical History:  Diagnosis Date   Anxiety    takes Valium  daily as needed   Basal cell carcinoma 01/21/1988   left nostril (MOHS), sup-right calf (CX35FU)   Basal cell carcinoma 03/22/1996   right calf (CX35FU)   Basal cell carcinoma 03/31/1995   left wing nose   Bruises easily    d/t meds   Cancer (HCC)    basal cell ca, in situ- uterine    Cataracts, bilateral    removed bilateral   Chronic back pain    Dizziness    r/t to meds   GERD (gastroesophageal reflux disease)    no meds on a regular basis but will take Tums if needed   Headache(784.0)    r/t neck issues   History of bronchitis 6-54yrs ago   History of colon polyps    benign   HOH (hard of hearing)    wears hearing aids   Hyperlipidemia    takes Atorvastatin  on Mondays and Fridays   Hypertension    Joint pain    Joint swelling    Multiple sclerosis (HCC)    Neuromuscular disorder (HCC)    Dr. FABIENE Crete- [facility], follows [facility]   Nocturia    PONV (postoperative nausea and vomiting)    trouble urinating after surgery in 2014   Postoperative nausea and vomiting 01/11/2019   Rheumatoid arthritis (HCC)    Dr Lorrayne weekly, RA- hands- knees- feet    Rheumatoid arthritis(714.0)    Dr Dolphus takes Xeljanz  daily   Right wrist fracture    Skin cancer    Squamous cell carcinoma of skin 11/02/2001   in situ-right knee (cx18fu)   Squamous cell carcinoma of skin  11/12/2011   in situ-left forearm (CX35FU), in situ-left foot (CX35FU)   Squamous cell carcinoma of skin 01/22/2012   in situ-left lower forearm (txpbx)   Squamous cell carcinoma of skin 11/17/2012   right shin (txpbx)   Squamous cell carcinoma of skin 10/28/2013   in situ-right shoulder (CX35FU)   Squamous cell carcinoma of skin 04/28/2014   well diff-right shin (txpbx)   Squamous cell carcinoma of skin 11/02/2014   in situ-Left shin (txpbx)   Squamous cell carcinoma of skin 12/15/2014   in situ-Left hand (txpbx), bowens-left side chest (txpbx)   Squamous cell carcinoma of skin 05/09/2015   in situ-left hand (txpbx)    Squamous cell carcinoma of skin 05/07/2016   in situ-left inner shin,ant, in situ-left inner shin, post, in situ-left outer forearm, in situ-right knuckle   Squamous cell carcinoma of skin 03/1302018   in situ-left shoulder (txpbx), in situ-right inner shin (txpbx), in situ-top of left foot (txpbx), KA- left forearm (txpbx)   Squamous cell carcinoma of skin 12/02/2016   in situ-left inner shin (txpbx)   Squamous cell carcinoma of skin 03/04/2017   in situ-left outer sup, shin (txpbx), in situ- right 2nd knuckle finger (txpbx), in situ- Left wrist (txpbx)   Squamous cell carcinoma  of skin 04/06/2018   in situ-above left knee inner (txpbx)   Squamous cell carcinoma of skin 04/21/2018   in situ-right top hand (txpbx)   Squamous cell carcinoma of skin 07/08/2018   in situ-right lower inner shin (txpbx)   Squamous cell carcinoma of skin 04/27/2019   in situ- Left neck(CX35FU), In situ- right neck (CX35FU)   Urinary retention    sees Dr.Wrenn about 2 times a yr    Past Surgical History:  Procedure Laterality Date   ABDOMINAL HYSTERECTOMY     1985   ANTERIOR CERVICAL DECOMP/DISCECTOMY FUSION N/A 04/23/2022   Procedure: Anterior Cervical Decompression/Discectomy Fusion - Cervical Seven-Thoracic One;  Surgeon: Louis Shove, MD;  Location: [facility] OR;  Service: Neurosurgery;   Laterality: N/A;  3C   APPENDECTOMY     with TAH   BACK SURGERY     several   BREAST BIOPSY Right    Results were negative   BROW LIFT Bilateral 10/02/2016   Procedure: BILATERAL LOWER LID BLEPHAROPLASTY;  Surgeon: Laurie Loyd Redhead, MD;  Location: [facility] OR;  Service: Plastics;  Laterality: Bilateral;   CARPAL TUNNEL RELEASE Right    cataracts     CERVICAL FUSION     Dr Leeann   CERVICAL FUSION  12/31/2012   Dr Leeann   CERVICAL FUSION  04/2022   CHEST TUBE INSERTION     for traumatic Pneumothorax   COLONOSCOPY  07/16/2010   normal    COLONOSCOPY W/ POLYPECTOMY  1997   negative since; Dr Debrah   ESOPHAGOGASTRODUODENOSCOPY  07/16/2010   normal   eye lid raise     EYE SURGERY Bilateral    cataracts removed - /w IOL   HARDWARE REMOVAL  08/2021   HIP CLOSED REDUCTION Right 11/16/2023   Procedure: CLOSED MANIPULATION, JOINT, HIP;  Surgeon: Ernie Cough, MD;  Location: [facility] ORS;  Service: Orthopedics;  Laterality: Right;   JOINT REPLACEMENT     LUMBAR FUSION     Dr Leeann   NASAL SINUS SURGERY     POSTERIOR CERVICAL FUSION/FORAMINOTOMY N/A 12/31/2012   Procedure: POSTERIOR LATERAL CERVICAL FUSION/FORAMINOTOMY LEVEL 1 CERVICAL THREE-FOUR WITH LATERAL MASS SCREWS;  Surgeon: Catalina CHRISTELLA Leeann, MD;  Location: [facility];  Service: Neurosurgery;  Laterality: N/A;   PTOSIS REPAIR Bilateral 10/02/2016   Procedure: INTERNAL PTOSIS REPAIR;  Surgeon: Laurie Loyd Redhead, MD;  Location: [facility] OR;  Service: Plastics;  Laterality: Bilateral;   SKIN BIOPSY     THYROID  SURGERY     R lobe removed- 1972, has grown back - CT last done- 2018   TOTAL HIP ARTHROPLASTY  12/22/2011   Procedure: TOTAL HIP ARTHROPLASTY;  Surgeon: Dempsey JINNY Sensor, MD;  Location: [facility] OR;  Service: Orthopedics;  Laterality: Left;   TOTAL HIP ARTHROPLASTY Right 11/12/2023   Procedure: ARTHROPLASTY, HIP, TOTAL, ANTERIOR APPROACH;  Surgeon: Fidel Rogue, MD;  Location: [facility] ORS;  Service: Orthopedics;  Laterality: Right;   TOTAL  SHOULDER ARTHROPLASTY     TUBAL LIGATION     UPPER GASTROINTESTINAL ENDOSCOPY  2012   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   WRIST SURGERY Left 09/16/2022   wrist/arm    Family History  Problem Relation Age of Onset   Cancer Mother        pancreatic   Diabetes Mother    Pancreatic cancer Mother    Heart disease Father        Rheumatic   Cancer Father        ? stomach  Stomach cancer Father    Cancer Sister        stomach   Stomach cancer Sister    Cancer Maternal Aunt        X 4; ? primary   Heart disease Paternal Aunt    Cancer Maternal Grandmother        cervical   Colon cancer Other        Aunts   Multiple sclerosis Daughter    Hypertension Neg Hx    Stroke Neg Hx    Esophageal cancer Neg Hx    Rectal cancer Neg Hx     Social History:  reports that Sherri [name] quit smoking about 42 years ago. Sherri [name] smoking use included cigarettes. Sherri [name] started smoking about 62 years ago. Sherri [name] has a 50 pack-year smoking history. Sherri [name] has never been exposed to tobacco smoke. Sherri [name] has never used smokeless tobacco. Sherri [name] reports current alcohol  use of about 14.0 standard drinks of alcohol  per week. Sherri [name] reports that Sherri [name] does not use drugs.  Allergies:  Allergies  Allergen Reactions   Darvon [Propoxyphene Hcl] Shortness Of Breath    ? Dose Related ? Lowered respirations greatly   Demerol [Meperidine] Shortness Of Breath and Other (See Comments)    Respiratory Distress   Penicillins Hives and Other (See Comments)    Has patient had a PCN reaction causing immediate rash, facial/tongue/throat swelling, SOB or lightheadedness with hypotension: No SEVERE RASH INVOLVING MUCUS MEMBRANES or SKIN NECROSIS: #  #  #  YES  #  #  #  Has patient had a PCN reaction that required hospitalization No Has patient had a PCN reaction occurring within the last 10 years: No.    Imuran [Azathioprine] Other (See Comments)    Weakness   Sulfa  Antibiotics Nausea Only and Other (See Comments)    States had severe indigestion  and heartburn and nausea and had to stop taking   Arava [Leflunomide] Other (See Comments)    Excessive weight gain   Lipitor [Atorvastatin ] Nausea And Vomiting    Currently taking 2 times weekly - able to tolerate low dose    Vytorin [Ezetimibe-Simvastatin] Nausea And Vomiting    Medications: I have reviewed the patient's current medications.  No results found. However, due to the size of the patient record, not all encounters were searched. Please check Results Review for a complete set of results.  No results found.  Review of Systems  HENT:  Negative for ear discharge, ear pain, hearing loss and tinnitus.   Eyes:  Negative for photophobia and pain.  Respiratory:  Negative for cough and shortness of breath.   Cardiovascular:  Negative for chest pain.  Gastrointestinal:  Negative for abdominal pain, nausea and vomiting.  Genitourinary:  Negative for dysuria, flank pain, frequency and urgency.  Musculoskeletal:  Positive for arthralgias (Right elbow). Negative for back pain, myalgias and neck pain.  Neurological:  Negative for dizziness and headaches.  Hematological:  Does not bruise/bleed easily.  Psychiatric/Behavioral:  The patient is not nervous/anxious.    Blood pressure 131/89, pulse (!) 109, temperature 98.9 F (37.2 C), resp. rate 14, height 5' 1 (1.549 m), weight 48.6 kg, SpO2 99%. Physical Exam Constitutional:      General: Sherri [name] is not in acute distress.    Appearance: Sherri [name] is well-developed. Sherri [name] is not diaphoretic.  HENT:     Head: Normocephalic and atraumatic.   Eyes:     General: No scleral icterus.       Right eye: No discharge.  Left eye: No discharge.     Conjunctiva/sclera: Conjunctivae normal.    Cardiovascular:     Rate and Rhythm: Normal rate and regular rhythm.  Pulmonary:     Effort: Pulmonary effort is normal. No respiratory distress.   Musculoskeletal:     Cervical back: Normal range of motion.     Comments: Right shoulder, elbow, wrist,  digits- Olecranon ulceration, no instability, no blocks to motion  Sens  Ax/R/M/U intact  Mot   Ax/ R/ PIN/ M/ AIN/ U intact  Rad 2+   Skin:    General: Skin is warm and dry.   Neurological:     Mental Status: Sherri [name] is alert.   Psychiatric:        Mood and Affect: Mood normal.        Behavior: Behavior normal.     Assessment/Plan: Open right elbow fx -- Plan I&D, ORIF tomorrow with Dr. [name] at [facility]. Awaiting transfer. Please keep NPO after MN.    Ozell DOROTHA Ned, PA-C Orthopedic Surgery [phone] 11/19/2023, 12:54 PM

## 2023-11-19 NOTE — Progress Notes (Signed)
 Physical Therapy Treatment Patient Details Name: Sherri [name] MRN: [id number] DOB: 1941/01/05 Today's Date: 11/19/2023   History of Present Illness Pt admitted with R hip fx and now s/p THR.  Pt with hx of fall with non-operative R elbow fx and hip fx not diagnosed until pt in SNF rehab ~ 1 month.  Pt taken home by family and to see PCP who did hip Xray and sent pt directly to [facility] for surgery.  pt underwent closed reduction R THA on 11/16/23.  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    PT Comments  Pt continues very agreeable and with no c/o dizziness with move to sitting feet down and stand/pvt chair to bedside.  Pt reports probable transfer to [facility] tomorrow for procedure on R elbow and hoping for dc home from there.    If plan is discharge home, recommend the following: A lot of help with walking and/or transfers;A lot of help with bathing/dressing/bathroom;Assistance with cooking/housework;Assist for transportation;Help with stairs or ramp for entrance   Can travel by private vehicle     No  Equipment Recommendations  None recommended by PT    Recommendations for Other Services OT consult     Precautions / Restrictions Precautions Precautions: Fall Recall of Precautions/Restrictions: Impaired (Pt recalls no THP stating I have so many rules I can't do anything) Precaution/Restrictions Comments: Watch BPs, has been orthostatic Required Braces or Orthoses: Other Brace Knee Immobilizer - Right: On at all times Other Brace: no further need for KI as per secure chat with Valery Potters PA by OT ed on 10/18/23 but now we need to watch skin from new AB brace and nursing alerted to as well Restrictions Weight Bearing Restrictions Per Provider Order: Yes RUE Weight Bearing Per Provider Order: Non weight bearing RLE Weight Bearing Per Provider Order: Weight bearing as tolerated Other Position/Activity Restrictions: posterior THP and no active ABd      Mobility  Bed Mobility Overal bed mobility: Needs Assistance Bed Mobility: Sit to Supine     Supine to sit: HOB elevated, Used rails, Min assist, Mod assist, +2 for physical assistance Sit to supine: Min assist, Mod assist, +2 for physical assistance, +2 for safety/equipment   General bed mobility comments: cues for sequence, adherence to THP and use of L LE to self assist,  Physical assist to manage R LE, to control trunk and to complete rotation with bed pad    Transfers Overall transfer level: Needs assistance Equipment used: Rolling walker (2 wheels) Transfers: Sit to/from Stand, Bed to chair/wheelchair/BSC Sit to Stand: Min assist, Mod assist, +2 physical assistance, +2 safety/equipment, From elevated surface Stand pivot transfers: Mod assist, From elevated surface              Ambulation/Gait               General Gait Details: Stand pvt recliner to bedside only   Stairs             Wheelchair Mobility     Tilt Bed    Modified Rankin (Stroke Patients Only)       Balance Overall balance assessment: Needs assistance Sitting-balance support: Feet supported, No upper extremity supported Sitting balance-Leahy Scale: Fair     Standing balance support: Bilateral upper extremity supported, Reliant on assistive device for balance Standing balance-Leahy Scale: Poor  Communication Communication Communication: Impaired Factors Affecting Communication: Hearing impaired  Cognition Arousal: Alert Behavior During Therapy: WFL for tasks assessed/performed   PT - Cognitive impairments: No apparent impairments                       PT - Cognition Comments: AxO x 3 pleasant Lady.  A litle nervous.  following all commands. Following commands: Intact      Cueing Cueing Techniques: Verbal cues, Gestural cues  Exercises      General Comments        Pertinent Vitals/Pain Pain Assessment Pain  Assessment: 0-10 Pain Score: 3  Pain Location: R hip/thigh with activity Pain Descriptors / Indicators: Discomfort Pain Intervention(s): Limited activity within patient's tolerance, Monitored during session, Premedicated before session, Ice applied    Home Living                          Prior Function            PT Goals (current goals can now be found in the care plan section) Acute Rehab PT Goals Patient Stated Goal: Regain IND PT Goal Formulation: With patient Time For Goal Achievement: 11/20/23 Potential to Achieve Goals: Good Progress towards PT goals: Progressing toward goals    Frequency    7X/week      PT Plan      Co-evaluation              AM-PAC PT 6 Clicks Mobility   Outcome Measure  Help needed turning from your back to your side while in a flat bed without using bedrails?: A Lot Help needed moving from lying on your back to sitting on the side of a flat bed without using bedrails?: A Lot Help needed moving to and from a bed to a chair (including a wheelchair)?: A Lot Help needed standing up from a chair using your arms (e.g., wheelchair or bedside chair)?: A Lot Help needed to walk in hospital room?: A Lot Help needed climbing 3-5 steps with a railing? : Total 6 Click Score: 11    End of Session Equipment Utilized During Treatment: Gait belt Activity Tolerance: Patient tolerated treatment well Patient left: in bed;with call bell/phone within reach;with bed alarm set Nurse Communication: Mobility status PT Visit Diagnosis: Difficulty in walking, not elsewhere classified (R26.2);Muscle weakness (generalized) (M62.81)     Time: 8398-8385 PT Time Calculation (min) (ACUTE ONLY): 13 min  Charges:    $Therapeutic Activity: 8-22 mins PT General Charges $$ ACUTE PT VISIT: 1 Visit                     [facility] PT Acute Rehabilitation Services Office [phone]    Amany Rando 11/19/2023, 5:16 PM

## 2023-11-20 ENCOUNTER — Inpatient Hospital Stay (HOSPITAL_COMMUNITY)

## 2023-11-20 ENCOUNTER — Encounter (HOSPITAL_COMMUNITY)
Admission: EM | Disposition: A | Discharge status: Home Health | Payer: Self-pay | Source: Home / Self Care | Attending: Family Medicine

## 2023-11-20 ENCOUNTER — Encounter (HOSPITAL_COMMUNITY): Payer: Self-pay | Admitting: Family Medicine

## 2023-11-20 ENCOUNTER — Other Ambulatory Visit: Payer: Self-pay

## 2023-11-20 DIAGNOSIS — I10 Essential (primary) hypertension: Secondary | ICD-10-CM

## 2023-11-20 DIAGNOSIS — J9601 Acute respiratory failure with hypoxia: Secondary | ICD-10-CM | POA: Diagnosis not present

## 2023-11-20 DIAGNOSIS — G35 Multiple sclerosis: Secondary | ICD-10-CM | POA: Diagnosis not present

## 2023-11-20 DIAGNOSIS — M810 Age-related osteoporosis without current pathological fracture: Secondary | ICD-10-CM | POA: Diagnosis not present

## 2023-11-20 DIAGNOSIS — A419 Sepsis, unspecified organism: Secondary | ICD-10-CM | POA: Diagnosis not present

## 2023-11-20 DIAGNOSIS — G9341 Metabolic encephalopathy: Secondary | ICD-10-CM | POA: Diagnosis not present

## 2023-11-20 DIAGNOSIS — Z931 Gastrostomy status: Secondary | ICD-10-CM | POA: Diagnosis not present

## 2023-11-20 DIAGNOSIS — L03115 Cellulitis of right lower limb: Secondary | ICD-10-CM | POA: Diagnosis not present

## 2023-11-20 DIAGNOSIS — S52021H Displaced fracture of olecranon process without intraarticular extension of right ulna, subsequent encounter for open fracture type I or II with delayed healing: Secondary | ICD-10-CM | POA: Diagnosis not present

## 2023-11-20 DIAGNOSIS — F419 Anxiety disorder, unspecified: Secondary | ICD-10-CM

## 2023-11-20 DIAGNOSIS — J9602 Acute respiratory failure with hypercapnia: Secondary | ICD-10-CM | POA: Diagnosis not present

## 2023-11-20 DIAGNOSIS — F32A Depression, unspecified: Secondary | ICD-10-CM

## 2023-11-20 DIAGNOSIS — E785 Hyperlipidemia, unspecified: Secondary | ICD-10-CM | POA: Diagnosis not present

## 2023-11-20 DIAGNOSIS — S52021B Displaced fracture of olecranon process without intraarticular extension of right ulna, initial encounter for open fracture type I or II: Secondary | ICD-10-CM

## 2023-11-20 DIAGNOSIS — M069 Rheumatoid arthritis, unspecified: Secondary | ICD-10-CM | POA: Diagnosis not present

## 2023-11-20 DIAGNOSIS — Z87891 Personal history of nicotine dependence: Secondary | ICD-10-CM

## 2023-11-20 HISTORY — PX: ORIF ELBOW FRACTURE: SHX5031

## 2023-11-20 LAB — BASIC METABOLIC PANEL WITH GFR
Anion gap: 10 (ref 5–15)
BUN: 5 mg/dL — ABNORMAL LOW (ref 8–23)
CO2: 26 mmol/L (ref 22–32)
Calcium: 8.2 mg/dL — ABNORMAL LOW (ref 8.9–10.3)
Chloride: 99 mmol/L (ref 98–111)
Creatinine, Ser: 0.59 mg/dL (ref 0.44–1.00)
GFR, Estimated: >60 mL/min (ref >60)
Glucose, Bld: 77 mg/dL (ref 70–99)
Potassium: 3.2 mmol/L — ABNORMAL LOW (ref 3.5–5.1)
Sodium: 135 mmol/L (ref 135–145)

## 2023-11-20 LAB — CBC
HCT: 24.3 % — ABNORMAL LOW (ref 36.0–46.0)
Hemoglobin: 8 g/dL — ABNORMAL LOW (ref 12.0–15.0)
MCH: 33.2 pg (ref 26.0–34.0)
MCHC: 32.9 g/dL (ref 30.0–36.0)
MCV: 100.8 fL — ABNORMAL HIGH (ref 80.0–100.0)
Platelets: 314 10*3/uL (ref 150–400)
RBC: 2.41 MIL/uL — ABNORMAL LOW (ref 3.87–5.11)
RDW: 16.5 % — ABNORMAL HIGH (ref 11.5–15.5)
WBC: 5.7 10*3/uL (ref 4.0–10.5)
nRBC: 0.9 % — ABNORMAL HIGH (ref 0.0–0.2)

## 2023-11-20 LAB — GLUCOSE, CAPILLARY: Glucose-Capillary: 102 mg/dL — ABNORMAL HIGH (ref 70–99)

## 2023-11-20 LAB — TYPE AND SCREEN
ABO/RH(D): A POS
Antibody Screen: NEGATIVE

## 2023-11-20 SURGERY — OPEN REDUCTION INTERNAL FIXATION (ORIF) ELBOW/OLECRANON FRACTURE
Anesthesia: General | Site: Elbow | Laterality: Right

## 2023-11-20 MED ORDER — VANCOMYCIN HCL IN DEXTROSE 1-5 GM/200ML-% IV SOLN
INTRAVENOUS | Status: AC
  Administered 2023-11-20: 1000 mg via INTRAVENOUS
  Filled 2023-11-20: qty 200

## 2023-11-20 MED ORDER — ONDANSETRON HCL 4 MG/2ML IJ SOLN
INTRAMUSCULAR | Status: AC
  Filled 2023-11-20: qty 2

## 2023-11-20 MED ORDER — TOBRAMYCIN SULFATE 1.2 G IJ SOLR
INTRAMUSCULAR | Status: DC | PRN
  Administered 2023-11-20: 1.2 g

## 2023-11-20 MED ORDER — ROCURONIUM BROMIDE 10 MG/ML (PF) SYRINGE
PREFILLED_SYRINGE | INTRAVENOUS | Status: DC | PRN
  Administered 2023-11-20: 10 mg via INTRAVENOUS
  Administered 2023-11-20: 30 mg via INTRAVENOUS

## 2023-11-20 MED ORDER — VANCOMYCIN HCL 1000 MG IV SOLR
INTRAVENOUS | Status: DC | PRN
  Administered 2023-11-20: 1000 mg

## 2023-11-20 MED ORDER — SUGAMMADEX SODIUM 200 MG/2ML IV SOLN
INTRAVENOUS | Status: DC | PRN
  Administered 2023-11-20: 200 mg via INTRAVENOUS

## 2023-11-20 MED ORDER — ACETAMINOPHEN 325 MG PO TABS: 650.0000 mg | ORAL_TABLET | Freq: Four times a day (QID) | ORAL | 0 refills | Status: AC | PRN

## 2023-11-20 MED ORDER — CEFAZOLIN SODIUM-DEXTROSE 2-4 GM/100ML-% IV SOLN: 2.0000 g | Freq: Three times a day (TID) | INTRAVENOUS | Status: DC

## 2023-11-20 MED ORDER — ROCURONIUM BROMIDE 10 MG/ML (PF) SYRINGE
PREFILLED_SYRINGE | INTRAVENOUS | Status: AC
  Filled 2023-11-20: qty 10

## 2023-11-20 MED ORDER — FENTANYL CITRATE (PF) 250 MCG/5ML IJ SOLN
INTRAMUSCULAR | Status: AC
  Filled 2023-11-20: qty 5

## 2023-11-20 MED ORDER — PROPOFOL 10 MG/ML IV BOLUS
INTRAVENOUS | Status: AC
Start: 2023-11-20 — End: 2023-11-20
  Filled 2023-11-20: qty 20

## 2023-11-20 MED ORDER — POLYETHYLENE GLYCOL 3350 17 G PO PACK: 17.0000 g | PACK | Freq: Every day | ORAL | 0 refills | Status: DC | PRN

## 2023-11-20 MED ORDER — LIDOCAINE 2% (20 MG/ML) 5 ML SYRINGE
INTRAMUSCULAR | Status: DC | PRN
  Administered 2023-11-20: 40 mg via INTRAVENOUS

## 2023-11-20 MED ORDER — FENTANYL CITRATE (PF) 100 MCG/2ML IJ SOLN: 25.0000 ug | INTRAMUSCULAR | Status: DC | PRN

## 2023-11-20 MED ORDER — MUPIROCIN 2 % EX OINT: TOPICAL_OINTMENT | Freq: Two times a day (BID) | CUTANEOUS | Status: DC

## 2023-11-20 MED ORDER — DEXAMETHASONE SODIUM PHOSPHATE 10 MG/ML IJ SOLN
INTRAMUSCULAR | Status: DC | PRN
  Administered 2023-11-20: 10 mg via INTRAVENOUS

## 2023-11-20 MED ORDER — TOBRAMYCIN SULFATE 1.2 G IJ SOLR
INTRAMUSCULAR | Status: AC
  Filled 2023-11-20: qty 1.2

## 2023-11-20 MED ORDER — CHLORHEXIDINE GLUCONATE 0.12 % MT SOLN
OROMUCOSAL | Status: AC
  Administered 2023-11-20: 15 mL via OROMUCOSAL
  Filled 2023-11-20: qty 15

## 2023-11-20 MED ORDER — CEFAZOLIN SODIUM-DEXTROSE 2-4 GM/100ML-% IV SOLN
INTRAVENOUS | Status: AC
  Filled 2023-11-20: qty 100

## 2023-11-20 MED ORDER — AMISULPRIDE (ANTIEMETIC) 5 MG/2ML IV SOLN: 10.0000 mg | Freq: Once | INTRAVENOUS | Status: DC | PRN

## 2023-11-20 MED ORDER — LIDOCAINE 2% (20 MG/ML) 5 ML SYRINGE
INTRAMUSCULAR | Status: AC
  Filled 2023-11-20: qty 5

## 2023-11-20 MED ORDER — POTASSIUM CHLORIDE CRYS ER 20 MEQ PO TBCR
40.0000 meq | EXTENDED_RELEASE_TABLET | ORAL | Status: AC
  Administered 2023-11-20 (×2): 40 meq via ORAL
  Filled 2023-11-20 (×2): qty 2

## 2023-11-20 MED ORDER — PHENYLEPHRINE HCL-NACL 20-0.9 MG/250ML-% IV SOLN
INTRAVENOUS | Status: DC | PRN
  Administered 2023-11-20: 25 ug/min via INTRAVENOUS

## 2023-11-20 MED ORDER — ORAL CARE MOUTH RINSE: 15.0000 mL | Freq: Once | OROMUCOSAL | Status: AC

## 2023-11-20 MED ORDER — FENTANYL CITRATE (PF) 250 MCG/5ML IJ SOLN
INTRAMUSCULAR | Status: DC | PRN
  Administered 2023-11-20: 50 ug via INTRAVENOUS

## 2023-11-20 MED ORDER — LACTATED RINGERS IV SOLN: INTRAVENOUS | Status: DC

## 2023-11-20 MED ORDER — MUPIROCIN 2 % EX OINT
TOPICAL_OINTMENT | CUTANEOUS | Status: AC
  Filled 2023-11-20: qty 22

## 2023-11-20 MED ORDER — PHENYLEPHRINE 80 MCG/ML (10ML) SYRINGE FOR IV PUSH (FOR BLOOD PRESSURE SUPPORT)
PREFILLED_SYRINGE | INTRAVENOUS | Status: DC | PRN
  Administered 2023-11-20: 120 ug via INTRAVENOUS

## 2023-11-20 MED ORDER — CHLORHEXIDINE GLUCONATE 0.12 % MT SOLN: 15.0000 mL | Freq: Once | OROMUCOSAL | Status: AC

## 2023-11-20 MED ORDER — DOCUSATE SODIUM 100 MG PO CAPS: 100.0000 mg | ORAL_CAPSULE | Freq: Every day | ORAL | 0 refills | Status: DC

## 2023-11-20 MED ORDER — BISACODYL 10 MG RE SUPP: 10.0000 mg | Freq: Every day | RECTAL | 0 refills | Status: DC | PRN

## 2023-11-20 MED ORDER — 0.9 % SODIUM CHLORIDE (POUR BTL) OPTIME
TOPICAL | Status: DC | PRN
Start: 2023-11-20 — End: 2023-11-20
  Administered 2023-11-20: 1000 mL

## 2023-11-20 MED ORDER — DEXAMETHASONE SODIUM PHOSPHATE 10 MG/ML IJ SOLN
INTRAMUSCULAR | Status: AC
  Filled 2023-11-20: qty 1

## 2023-11-20 MED ORDER — CEFAZOLIN SODIUM-DEXTROSE 2-4 GM/100ML-% IV SOLN
2.0000 g | INTRAVENOUS | Status: AC
  Administered 2023-11-20: 2 g via INTRAVENOUS

## 2023-11-20 MED ORDER — PROPOFOL 10 MG/ML IV BOLUS
INTRAVENOUS | Status: DC | PRN
  Administered 2023-11-20: 100 mg via INTRAVENOUS

## 2023-11-20 MED ORDER — VANCOMYCIN HCL IN DEXTROSE 1-5 GM/200ML-% IV SOLN: 1000.0000 mg | INTRAVENOUS | Status: AC

## 2023-11-20 SURGICAL SUPPLY — 67 items
BAG COUNTER SPONGE SURGICOUNT (BAG) ×2 IMPLANT
BENZOIN TINCTURE PRP APPL 2/3 (GAUZE/BANDAGES/DRESSINGS) IMPLANT
BIT DRILL 3.2 QUICK MINI 300 (DRILL) IMPLANT
BIT DRILL 5.0 QC 6.5 (BIT) IMPLANT
BLADE AVERAGE 25X9 (BLADE) IMPLANT
BLADE CLIPPER SURG (BLADE) ×2 IMPLANT
BLADE SURG 10 STRL SS (BLADE) IMPLANT
BNDG COHESIVE 4X5 TAN STRL LF (GAUZE/BANDAGES/DRESSINGS) ×2 IMPLANT
BNDG COMPR ESMARK 4X3 LF (GAUZE/BANDAGES/DRESSINGS) IMPLANT
BNDG ELASTIC 3INX 5YD STR LF (GAUZE/BANDAGES/DRESSINGS) ×2 IMPLANT
BNDG ELASTIC 4INX 5YD STR LF (GAUZE/BANDAGES/DRESSINGS) IMPLANT
BNDG ELASTIC 4X5.8 VLCR STR LF (GAUZE/BANDAGES/DRESSINGS) ×2 IMPLANT
BNDG ELASTIC 6INX 5YD STR LF (GAUZE/BANDAGES/DRESSINGS) ×2 IMPLANT
BNDG GAUZE DERMACEA FLUFF 4 (GAUZE/BANDAGES/DRESSINGS) ×2 IMPLANT
BRUSH SCRUB EZ PLAIN DRY (MISCELLANEOUS) ×4 IMPLANT
CHLORAPREP W/TINT 26 (MISCELLANEOUS) ×2 IMPLANT
CLEANER TIP ELECTROSURG 2X2 (MISCELLANEOUS) ×2 IMPLANT
COVER SURGICAL LIGHT HANDLE (MISCELLANEOUS) ×4 IMPLANT
CUFF TOURN SGL QUICK 18X4 (TOURNIQUET CUFF) IMPLANT
CUFF TRNQT CYL 24X4X16.5-23 (TOURNIQUET CUFF) IMPLANT
DRAPE C-ARM 42X72 X-RAY (DRAPES) ×2 IMPLANT
DRAPE C-ARMOR (DRAPES) ×2 IMPLANT
DRAPE INCISE IOBAN 66X45 STRL (DRAPES) IMPLANT
DRAPE SURG ORHT 6 SPLT 77X108 (DRAPES) ×4 IMPLANT
DRAPE U-SHAPE 47X51 STRL (DRAPES) ×2 IMPLANT
DRSG ADAPTIC 3X8 NADH LF (GAUZE/BANDAGES/DRESSINGS) IMPLANT
DRSG MEPITEL 4X7.2 (GAUZE/BANDAGES/DRESSINGS) IMPLANT
ELECTRODE REM PT RTRN 9FT ADLT (ELECTROSURGICAL) ×2 IMPLANT
GAUZE PAD ABD 8X10 STRL (GAUZE/BANDAGES/DRESSINGS) IMPLANT
GAUZE SPONGE 4X4 12PLY STRL (GAUZE/BANDAGES/DRESSINGS) ×2 IMPLANT
GAUZE XEROFORM 5X9 LF (GAUZE/BANDAGES/DRESSINGS) ×2 IMPLANT
GLOVE BIO SURGEON STRL SZ 6.5 (GLOVE) ×6 IMPLANT
GLOVE BIO SURGEON STRL SZ7.5 (GLOVE) ×8 IMPLANT
GLOVE BIOGEL PI IND STRL 6.5 (GLOVE) ×2 IMPLANT
GLOVE BIOGEL PI IND STRL 7.5 (GLOVE) ×2 IMPLANT
GOWN STRL REUS W/ TWL LRG LVL3 (GOWN DISPOSABLE) ×4 IMPLANT
KIT BASIN OR (CUSTOM PROCEDURE TRAY) ×2 IMPLANT
KIT TURNOVER KIT B (KITS) ×2 IMPLANT
MANIFOLD NEPTUNE II (INSTRUMENTS) ×2 IMPLANT
NS IRRIG 1000ML POUR BTL (IV SOLUTION) ×2 IMPLANT
PACK ORTHO EXTREMITY (CUSTOM PROCEDURE TRAY) ×2 IMPLANT
PAD ARMBOARD POSITIONER FOAM (MISCELLANEOUS) ×4 IMPLANT
PAD CAST 4YDX4 CTTN HI CHSV (CAST SUPPLIES) ×2 IMPLANT
PAD CAST CTTN 4X4 STRL (SOFTGOODS) IMPLANT
PADDING CAST ABS COTTON 3X4 (CAST SUPPLIES) IMPLANT
SCREW CANN PT 6.5X115 (Screw) IMPLANT
SPIKE FLUID TRANSFER (MISCELLANEOUS) IMPLANT
SPONGE T-LAP 18X18 ~~LOC~~+RFID (SPONGE) ×4 IMPLANT
STAPLER SKIN PROX 35W (STAPLE) ×2 IMPLANT
STRIP CLOSURE SKIN 1/2X4 (GAUZE/BANDAGES/DRESSINGS) IMPLANT
SUCTION TUBE FRAZIER 10FR DISP (SUCTIONS) ×2 IMPLANT
SUT ETHILON 2 0 PSLX (SUTURE) IMPLANT
SUT MNCRL AB 3-0 PS2 18 (SUTURE) ×2 IMPLANT
SUT MON AB 2-0 CT1 36 (SUTURE) ×2 IMPLANT
SUT PDS AB 2-0 CT1 27 (SUTURE) IMPLANT
SUT PROLENE 3 0 PS 2 (SUTURE) ×4 IMPLANT
SUT VIC AB 0 CT1 27XBRD ANBCTR (SUTURE) ×4 IMPLANT
SUT VIC AB 2-0 CT1 TAPERPNT 27 (SUTURE) ×4 IMPLANT
SUT VIC AB 2-0 CT3 27 (SUTURE) IMPLANT
SYR CONTROL 10ML LL (SYRINGE) ×2 IMPLANT
TOWEL GREEN STERILE (TOWEL DISPOSABLE) ×4 IMPLANT
TOWEL GREEN STERILE FF (TOWEL DISPOSABLE) IMPLANT
TUBE CONNECTING 12X1/4 (SUCTIONS) ×2 IMPLANT
UNDERPAD 30X36 HEAVY ABSORB (UNDERPADS AND DIAPERS) ×2 IMPLANT
WASHER CANN 12.7 STRL (Washer) IMPLANT
WATER STERILE IRR 1000ML POUR (IV SOLUTION) ×2 IMPLANT
YANKAUER SUCT BULB TIP NO VENT (SUCTIONS) ×2 IMPLANT

## 2023-11-20 NOTE — Progress Notes (Signed)
 Incentive spirometry introduced with teach back.  SCD placed.  Patient refused to have TED hose this time

## 2023-11-20 NOTE — TOC Transition Note (Signed)
 Transition of Care [facility]) - Discharge Note   Patient Details  Name: Sherri [name] MRN: [id number] Date of Birth: 1940-10-20  Transition of Care [facility] CM/SW Contact:  Rosaline JONELLE Joe, RN Phone Number: 11/20/2023, 3:40 PM   Clinical Narrative:    CM spoke with Ephraim, RN and patient's family is picking the patient up by car to transport home.  I called [facility] and updated that patient was discharging home.  MD asked to place home health orders for PT/OT.     Barriers to Discharge: Continued Medical Work up   Patient Goals and CMS Choice Patient states their goals for this hospitalization and ongoing recovery are:: per pt's husband , prefers return home with home therapies          Discharge Placement                       Discharge Plan and Services Additional resources added to the After Visit Summary for                                       Social Drivers of Health (SDOH) Interventions SDOH Screenings   Food Insecurity: No Food Insecurity (11/10/2023)  Housing: Low Risk  (11/10/2023)  Transportation Needs: No Transportation Needs (11/10/2023)  Utilities: Not At Risk (11/10/2023)  Alcohol  Screen: Low Risk  (02/21/2022)  Depression (PHQ2-9): Low Risk  (07/27/2023)  Financial Resource Strain: Low Risk  (02/21/2022)  Physical Activity: Inactive (02/21/2022)  Social Connections: Socially Integrated (11/10/2023)  Recent Concern: Social Connections - Moderately Isolated (10/03/2023)  Stress: Stress Concern Present (02/21/2022)  Tobacco Use: Medium Risk (11/20/2023)     Readmission Risk Interventions     No data to display

## 2023-11-20 NOTE — Anesthesia Preprocedure Evaluation (Addendum)
 Anesthesia Evaluation  Patient identified by MRN, date of birth, ID band Patient awake    Reviewed: Allergy & Precautions, H&P , NPO status , Patient's Chart, lab work & pertinent test results, reviewed documented beta blocker date and time   History of Anesthesia Complications (+) PONV and history of anesthetic complications  Airway Mallampati: II  TM Distance: >3 FB Neck ROM: Full    Dental no notable dental hx. (+) Teeth Intact, Dental Advisory Given   Pulmonary former smoker   Pulmonary exam normal breath sounds clear to auscultation       Cardiovascular hypertension, Pt. on medications and Pt. on home beta blockers + Peripheral Vascular Disease   Rhythm:Regular Rate:Normal     Neuro/Psych  Headaches  Anxiety      Neuromuscular disease (MS. No recent flares)    GI/Hepatic Neg liver ROS,GERD  Medicated,,  Endo/Other  negative endocrine ROS    Renal/GU negative Renal ROS  negative genitourinary   Musculoskeletal  (+) Arthritis , Rheumatoid disorders,    Abdominal   Peds  Hematology negative hematology ROS (+)   Anesthesia Other Findings   Reproductive/Obstetrics negative OB ROS                             Anesthesia Physical Anesthesia Plan  ASA: 3  Anesthesia Plan: General   Post-op Pain Management: Tylenol  PO (pre-op)*   Induction: Intravenous, Rapid sequence and Cricoid pressure planned  PONV Risk Score and Plan: 4 or greater and Dexamethasone , Ondansetron  and Treatment may vary due to age or medical condition  Airway Management Planned: Oral ETT  Additional Equipment:   Intra-op Plan:   Post-operative Plan: Extubation in OR  Informed Consent: I have reviewed the patients History and Physical, chart, labs and discussed the procedure including the risks, benefits and alternatives for the proposed anesthesia with the patient or authorized representative who has indicated  his/her understanding and acceptance.     Dental advisory given  Plan Discussed with: CRNA  Anesthesia Plan Comments:        Anesthesia Quick Evaluation

## 2023-11-20 NOTE — Anesthesia Procedure Notes (Signed)
 Procedure Name: Intubation Date/Time: 11/20/2023 9:32 AM  Performed by: Alen Motto D, CRNAPre-anesthesia Checklist: Patient identified, Emergency Drugs available, Suction available and Patient being monitored Patient Re-evaluated:Patient Re-evaluated prior to induction Oxygen Delivery Method: Circle System Utilized Preoxygenation: Pre-oxygenation with 100% oxygen Induction Type: IV induction Ventilation: Mask ventilation without difficulty Laryngoscope Size: Mac and 3 Grade View: Grade I Tube type: Oral Tube size: 7.0 mm Number of attempts: 1 Airway Equipment and Method: Stylet and Oral airway Placement Confirmation: ETT inserted through vocal cords under direct vision, positive ETCO2 and breath sounds checked- equal and bilateral Secured at: 21 cm Tube secured with: Tape Dental Injury: Teeth and Oropharynx as per pre-operative assessment

## 2023-11-20 NOTE — Discharge Summary (Addendum)
 Physician Discharge Summary   Patient: Sherri  AARILYN [name] MRN: [id number] DOB: 04-08-1941  Admit date:     11/10/2023  Discharge date: 11/20/23  Discharge Physician: Drue ONEIDA Potter   PCP: [name] PARAS, MD   Recommendations at discharge:    Discharge Diagnoses:  Dislocation of total right hip arthroplasty; found on 11/15/2023 Right impacted hip fracture Right elbow wound Anemia Chronic pain Urinary retention GERD Rheumatoid arthritis Hyperlipidemia Hypokalemia Hypotension Hyponatremia, chronic   Hospital Course: 83 y.o. female with medical history significant of anxiety, GERD, rheumatoid arthritis, hypertension, multiple sclerosis, lipidemia who presented to the emergency department due to hip pain.  She had a recent fall resulting in right femoral neck fracture and she underwent right total hip replacement on 11/12/2023.  While working with physical therapy, she was noted to have shortening through her right lower extremity.  X-ray showed a right hip dislocation.  Orthopedics consulted.  She is status post closed reduction of the right total hip replacement.   Patient also had a wound on her right elbow since she fell with olecranon fracture, she was advised to see Dr. [name] as an outpatient without any plan for surgical intervention.  Examination of the right elbow on 6/25 showed skin opening with visualization of possible underlying bone.  Orthopedics reconsulted.  Orthopedics took patient for surgery on 11/20/2023.  Patient underwent open reduction and internal fixation of right olecranial fracture, exploration and debridement of the elbow.  Patient has been cleared by surgeon for discharge today and will follow-up as an outpatient.  Consultants: Orthopedic surgery Procedures performed: As mentioned above Disposition: Home Diet recommendation:  Cardiac diet DISCHARGE MEDICATION: Allergies as of 11/20/2023       Reactions   Darvon [propoxyphene Hcl] Shortness Of Breath   ? Dose  Related ? Lowered respirations greatly   Demerol [meperidine] Shortness Of Breath, Other (See Comments)   Respiratory Distress   Penicillins Hives, Other (See Comments)   Has patient had a PCN reaction causing immediate rash, facial/tongue/throat swelling, SOB or lightheadedness with hypotension: No SEVERE RASH INVOLVING MUCUS MEMBRANES or SKIN NECROSIS: #  #  #  YES  #  #  #  Has patient had a PCN reaction that required hospitalization No Has patient had a PCN reaction occurring within the last 10 years: No.   Imuran [azathioprine] Other (See Comments)   Weakness   Sulfa  Antibiotics Nausea Only, Other (See Comments)   States had severe indigestion and heartburn and nausea and had to stop taking   Arava [leflunomide] Other (See Comments)   Excessive weight gain   Lipitor [atorvastatin ] Nausea And Vomiting   Currently taking 2 times weekly - able to tolerate low dose    Vytorin [ezetimibe-simvastatin] Nausea And Vomiting        Medication List     STOP taking these medications    traMADol  50 MG tablet Commonly known as: ULTRAM        TAKE these medications    acetaminophen  325 MG tablet Commonly known as: TYLENOL  Take 2 tablets (650 mg total) by mouth every 6 (six) hours as needed for mild pain (pain score 1-3) or fever (or Fever >/= 101).   aspirin  81 MG chewable tablet Commonly known as: Aspirin  Childrens Chew 1 tablet (81 mg total) by mouth 2 (two) times daily with a meal.   atorvastatin  10 MG tablet Commonly known as: LIPITOR TAKE 1 TAB ON MONDAYS AND FRIDAY   Biotin  5000 MCG Tabs Take 10,000 mcg by mouth in  the morning.   bisacodyl  10 MG suppository Commonly known as: DULCOLAX Place 1 suppository (10 mg total) rectally daily as needed for moderate constipation.   carvedilol  3.125 MG tablet Commonly known as: COREG  Take 1 tablet (3.125 mg total) by mouth 2 (two) times daily with a meal. What changed: how much to take   cephALEXin  500 MG capsule Commonly  known as: KEFLEX  Take 1 capsule (500 mg total) by mouth 3 (three) times daily for 10 days.   chlorhexidine  4 % external liquid Commonly known as: HIBICLENS  Apply 15 mLs (1 Application total) topically as directed for 30 doses. Use as directed daily for 5 days every other week for 6 weeks.   cholecalciferol  25 MCG (1000 UNIT) tablet Commonly known as: VITAMIN D3 Take 1,000 Units by mouth in the morning.   cyclobenzaprine  10 MG tablet Commonly known as: FLEXERIL  TAKE 1 TABLET BY MOUTH THREE TIMES A DAY AS NEEDED FOR MUSCLE SPASM   diazepam  5 MG tablet Commonly known as: VALIUM  TAKE 1 TABLET BY MOUTH AT BEDTIME AS NEEDED FOR MUSCLE SPASMS.   diclofenac sodium 1 % Gel Commonly known as: VOLTAREN APPLY 3 GRAMS TO 3 LARGE JOINTS 3 TIMES A DAY AS NEEDED   docusate sodium  100 MG capsule Commonly known as: COLACE Take 1 capsule (100 mg total) by mouth daily. Start taking on: November 21, 2023   DULoxetine  20 MG capsule Commonly known as: CYMBALTA  Take 40 mg by mouth at bedtime.   folic acid  1 MG tablet Commonly known as: FOLVITE  Take 1 tablet (1 mg total) by mouth daily.   furosemide  20 MG tablet Commonly known as: LASIX  TAKE 1 TABLET BY MOUTH EVERY DAY   HYDROcodone -acetaminophen  5-325 MG tablet Commonly known as: NORCO/VICODIN Take 1 tablet by mouth every 4 (four) hours as needed for up to 7 days for moderate pain (pain score 4-6) or severe pain (pain score 7-10).   LUBRICATING EYE DROPS OP Place 1 drop into both eyes daily as needed (dry eyes).   methotrexate  2.5 MG tablet Commonly known as: RHEUMATREX Take 5 tablets (12.5 mg total) by mouth once a week. Caution:Chemotherapy. Protect from light.   mupirocin  ointment 2 % Commonly known as: BACTROBAN  Place 1 Application into the nose 2 (two) times daily for 60 doses. Use as directed 2 times daily for 5 days every other week for 6 weeks.   oxybutynin  5 MG tablet Commonly known as: DITROPAN  Take 5 mg by mouth daily.    pantoprazole  40 MG tablet Commonly known as: PROTONIX  Take 1 tablet (40 mg total) by mouth 2 (two) times daily before a meal.   polyethylene glycol 17 g packet Commonly known as: MIRALAX  / GLYCOLAX  Take 17 g by mouth daily as needed for mild constipation.   predniSONE  5 MG tablet Commonly known as: DELTASONE  TAKE 1 TABLET BY MOUTH EVERY DAY IN THE MORNING   Prolia  60 MG/ML Sosy injection Generic drug: denosumab  Inject 60 mg into the skin every 6 (six) months. Deliver to rheum: [address], [address], [place], [place] [address]. Appt on 07/23/2022   [name]  15 MG Tb24 Generic drug: Upadacitinib  ER Take 15 mg by mouth daily.        Follow-up Information     Leigh Valery RAMAN, [place]. Schedule an appointment as soon as possible for a visit in 2 week(s).   Specialty: Orthopedic Surgery Why: For suture removal, For wound re-check Contact information: [address] [place] [address] [phone]  [facility], [facility]  Follow up.   Why: [facility]   Home Health Physical Therapy/Occupational Therapy Contact information: [address] [place] [address] [phone]                Discharge Exam: Filed Weights   11/10/23 2258 11/16/23 0613 11/20/23 0807  Weight: 48.6 kg 48.6 kg (P) 49.9 kg    General exam: Overall comfortable, not in distress HEENT: PERRL Respiratory system:  no wheezes or crackles  Cardiovascular system: S1 & S2 heard, RRR.  Gastrointestinal system: Abdomen is nondistended, soft and nontender. Central nervous system: Alert and oriented Extremities: No edema, no clubbing ,no cyanosis, dressing on the right elbow noted Skin: No rashes, no ulcers,no icterus      Condition at discharge: good  The results of significant diagnostics from this hospitalization (including imaging, microbiology, ancillary and laboratory) are listed below for reference.   Imaging Studies: DG Elbow 2 Views  Right Result Date: 11/20/2023 CLINICAL DATA:  Fracture, postop. EXAM: RIGHT ELBOW - 2 VIEW COMPARISON:  Preoperative radiograph FINDINGS: Screw traverses the olecranon fracture. There is improved fracture alignment from preoperative imaging. Persistent displacement of 6-7 mm at the articular surface. Overlying soft tissue edema and air related to recent surgery. IMPRESSION: ORIF of olecranon fracture. Electronically Signed   By: Andrea Gasman M.D.   On: 11/20/2023 11:14   DG ELBOW COMPLETE RIGHT (3+VIEW) Result Date: 11/20/2023 CLINICAL DATA:  Elective surgery EXAM: RIGHT ELBOW - COMPLETE 3+ VIEW COMPARISON:  Radiograph 11/12/2023 FINDINGS: Four fluoroscopic spot views of the elbow submitted from the operating room. Screw traverses the proximal ulnar fracture with improved fracture alignment. Fluoroscopy time 23 seconds. Dose 0.29 mGy. IMPRESSION: Intraoperative fluoroscopy during proximal ulnar fracture fixation. Electronically Signed   By: Andrea Gasman M.D.   On: 11/20/2023 11:13   DG C-Arm 1-60 Min-No Report Result Date: 11/20/2023 Fluoroscopy was utilized by the requesting physician.  No radiographic interpretation.   DG Pelvis Portable Result Date: 11/16/2023 EXAM: 2 VIEW(S) XRAY OF THE PELVIS 11/16/2023 07:31:00 AM COMPARISON: None available. 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 FINDINGS: BONES AND JOINTS: The image at 07:24 am shows a persistent superior dislocation of the right total hip arthroplasty. Image at 7:27 demonstrates the hip is located. Skin staples remain. Visualized left total hip arthroplasty is within normal limits. Lumbar fusion is noted. SOFT TISSUES: The soft tissues are unremarkable. IMPRESSION: 1. 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 2. Visualized left total hip arthroplasty is within normal limits. Electronically signed by: Lonni Necessary MD 11/16/2023 07:49 AM  EDT RP Workstation: [id number]   DG Pelvis Portable Result Date: 11/15/2023 CLINICAL DATA:  Right hip pain status post right hip surgery 3 days ago. EXAM: PORTABLE PELVIS 1-2 VIEWS COMPARISON:  11/12/2023 FINDINGS: Bilateral hip replacements. There is superior dislocation of the right hip replacement. The acetabular cup appears more vertically oriented than on prior study. No fracture. IMPRESSION: Bilateral replacements. Superior dislocation of the right hip replacement. The acetabular component appears more vertically oriented. Electronically Signed   By: Franky Crease M.D.   On: 11/15/2023 18:35   DG Pelvis Portable Result Date: 11/12/2023 CLINICAL DATA:  Status post total right hip arthroplasty. EXAM: PORTABLE PELVIS 1-2 VIEWS COMPARISON:  AP pelvis 11/10/2023, right hip radiographs 11/10/2023 FINDINGS: Interval total right hip arthroplasty. Redemonstration of total left hip arthroplasty. The distal femoral stems are not imaged. No perihardware lucency is seen  to indicate hardware failure or loosening. Lateral right hip and thigh subcutaneous air expected postoperative change. Lateral right thigh surgical skin staples. Mild superior pubic symphysis joint space narrowing. Lumbar spine and superior sacral posterior fusion hardware again noted. IMPRESSION: Interval total right hip arthroplasty without evidence of hardware failure. Electronically Signed   By: Tanda Lyons M.D.   On: 11/12/2023 19:09   DG Elbow 2 Views Right Result Date: 11/12/2023 CLINICAL DATA:  Right elbow fracture. EXAM: RIGHT ELBOW - 2 VIEW COMPARISON:  Right elbow radiographs 10/08/2023 FINDINGS: There is diffuse decreased bone mineralization. There is increased diastasis of the previously seen transverse fracture of the olecranon. Posteriorly, this diastasis measures up to 25 cm, previously 1.6 cm and anteriorly 1.0 cm, previously 0.9 cm. No healing bridging sclerosis or peripheral callus formation. Moderate radiocapitellar joint space  narrowing. Elevation of the distal humeral fat pad suggesting an elbow joint effusion. IMPRESSION: Increased diastasis of the previously seen transverse fracture of the olecranon. No healing bridging sclerosis at this time. Electronically Signed   By: Tanda Lyons M.D.   On: 11/12/2023 19:08   DG HIP UNILAT WITH PELVIS 1V RIGHT Result Date: 11/12/2023 CLINICAL DATA:  Total hip arthroplasty anterior approach. EXAM: DG HIP (WITH OR WITHOUT PELVIS) 1V RIGHT COMPARISON:  Right hip radiographs 11/10/2023 FINDINGS: Images were performed intraoperatively without the presence of a radiologist. The patient is undergoing total right hip arthroplasty. Partial visualization of prior total left hip arthroplasty. No hardware complication is seen. Total fluoroscopy images: 4 Total fluoroscopy time: 13 seconds Total dose: Radiation Exposure Index (as provided by the fluoroscopic device): 1.39 mGy air Kerma Please see intraoperative findings for further detail. IMPRESSION: Intraoperative fluoroscopy for total right hip arthroplasty. Electronically Signed   By: Tanda Lyons M.D.   On: 11/12/2023 19:06   DG C-Arm 1-60 Min-No Report Result Date: 11/12/2023 Fluoroscopy was utilized by the requesting physician.  No radiographic interpretation.   DG C-Arm 1-60 Min-No Report Result Date: 11/12/2023 Fluoroscopy was utilized by the requesting physician.  No radiographic interpretation.   DG C-Arm 1-60 Min-No Report Result Date: 11/12/2023 Fluoroscopy was utilized by the requesting physician.  No radiographic interpretation.   DG Pelvis 1-2 Views Result Date: 11/10/2023 CLINICAL DATA:  Right hip pain since fall 4 weeks ago. EXAM: PELVIS - 1-2 VIEW COMPARISON:  Hip series today FINDINGS: Postoperative changes from posterior fusion in the lower lumbar spine. Prior left hip replacement. No hardware complicating feature. Foreshortening of the right femoral neck compatible with impacted femoral neck fracture. No subluxation or  dislocation. IMPRESSION: Impacted right femoral neck fracture. Electronically Signed   By: Franky Crease M.D.   On: 11/10/2023 16:39   DG HIP UNILAT WITH PELVIS 2-3 VIEWS RIGHT Result Date: 11/10/2023 CLINICAL DATA:  Fall 4 weeks ago, right hip pain EXAM: DG HIP (WITH OR WITHOUT PELVIS) 2-3V RIGHT COMPARISON:  07/12/2021 FINDINGS: There is foreshortening of the femoral neck most compatible with impacted femoral neck fracture. No subluxation or dislocation. IMPRESSION: Impacted right femoral neck fracture. Electronically Signed   By: Franky Crease M.D.   On: 11/10/2023 16:39    Microbiology: Results for orders placed or performed during the hospital encounter of 11/10/23  Surgical PCR screen     Status: Abnormal   Collection Time: 11/12/23 12:24 AM   Specimen: Nasal Mucosa; Nasal Swab  Result Value Ref Range Status   MRSA, PCR POSITIVE (A) NEGATIVE Final    Comment: RESULT CALLED TO, READ BACK BY AND VERIFIED WITH: HALL,  J. 0940 11/12/23 BY JE    Staphylococcus aureus POSITIVE (A) NEGATIVE Final    Comment: RESULT CALLED TO, READ BACK BY AND VERIFIED WITH: HALL, J. 0940 11/12/23 BY JE (NOTE) The Xpert SA Assay (FDA approved for NASAL specimens in patients 37 years of age and older), is one component of a comprehensive surveillance program. It is not intended to diagnose infection nor to guide or monitor treatment. Performed at [facility], [address] [place], [place] [address]    *Note: Due to a large number of results and/or encounters for the requested time period, some results have not been displayed. A complete set of results can be found in Results Review.    Labs: CBC: Recent Labs  Lab 11/14/23 0335 11/14/23 0720 11/15/23 0317 11/17/23 0347 11/20/23 0336  WBC 12.7* 12.4* 8.5 8.5 5.7  HGB 6.5* 6.6* 9.2* 8.3* 8.0*  HCT 20.6* 20.3* 27.9* 25.9* 24.3*  MCV 109.6* 107.4* 102.2* 105.3* 100.8*  PLT 248 276 257 289 314   Basic Metabolic Panel: Recent  Labs  Lab 11/14/23 0335 11/15/23 0317 11/17/23 0347 11/20/23 0336  NA 130* 132* 134* 135  K 4.3 3.7 4.2 3.2*  CL 95* 97* 100 99  CO2 26 26 25 26   GLUCOSE 137* 98 92 77  BUN 11 11 8  5*  CREATININE 0.75 0.69 0.71 0.59  CALCIUM  7.8* 8.1* 8.0* 8.2*   Liver Function Tests: No results for input(s): AST, ALT, ALKPHOS, BILITOT, PROT, ALBUMIN  in the last 168 hours. CBG: Recent Labs  Lab 11/20/23 1144  GLUCAP 102*    Discharge time spent:  37 minutes.  Signed: Drue ONEIDA Potter, MD [facility] 11/20/2023

## 2023-11-20 NOTE — Op Note (Signed)
 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 Item Serial No. Manufacturer Lot No. LRB No. Used Action  SCREW CANN PT 6.5X115 - [id number] Screw SCREW CANN PT [id number]  [employer]  Right 1 Implanted  WASHER CANN 12.7 STRL - [id number] Washer WASHER CANN 12.7 [employer] [id number] Right 1 Implanted     Indications for Surgery: 83 year old female who sustained a right olecranon fracture in mid May.  Initial course of nonoperative management was attempted.  She was lost to follow-up.  Unfortunately she fell and broke her hip and sustained left femoral neck fracture requiring a total hip arthroplasty.  During that hospitalization she was found to have an open wound over her previous olecranon fracture with further displacement.  Due to the exposed bone and open nature of her injury I recommend proceeding with debridement and irrigation with surgical fixation and closure.  Risks and benefits were discussed with the patient and her daughter.  Risk include but not limited to bleeding, infection, malunion, nonunion, hardware failure, nerve and blood vessel injury, DVT, even the possibility of anesthetic complications.  They agreed to proceed with surgery and consent was obtained.  Operative Findings: 1.  Exposed  right olecranon with extension into the elbow joint treated with irrigation debridement.  Exploration of the elbow joint was performed which showed no active infection process or purulence 2.  Open reduction internal fixation of olecranon fracture using [employer] 6.5 mm cannulated screw with a washer.  Procedure: The patient was identified in the preoperative holding area. Consent was confirmed with the patient and their family and all questions were answered. The operative extremity was marked after confirmation with the patient. she was then brought back to the operating room by our anesthesia colleagues.  She was placed under general anesthetic carefully transferred over to radiolucent flattop table.  Position on a hand table.  The right upper extremity was prepped and draped in usual sterile fashion.  A timeout was performed to verify the patient, the procedure, and the extremity.  Preoperative antibiotics were dosed.  Fluoroscopic imaging showed the unstable nature of her injury.  The open wound was extended proximally and distally to expose the olecranon.  I then debrided the bone and fracture site and actually ended up going into the elbow joint.  There is no obvious purulence.  There is for fibrinous material that I debrided using a curette.  Got it back to healthy bleeding bone.  I then exposed the proximal portion of the olecranon where the triceps attaches.  I was unable to use a reduction tenaculum to anatomically reduce the fracture.  I did irrigate thoroughly prior to doing this.  I confirmed anatomic reduction using fluoroscopy.  I then positioned a guidewire for the 6.5 mm cannulated screws at the proximal portion of the olecranon and placed down the center of the canal of  the ulna.  I then measured the length and chose to use 115 mm partially-threaded 6.5 millimeter screw with a washer.  This was placed in decent fixation was obtained.  Final fluoroscopic imaging was obtained.  The  incision was irrigated again.  A gram of vancomycin  powder 1.2 g of tobramycin  powder was placed.  The skin was then closed with 2-0 nylon suture.  A sterile dressing was applied.  The patient was then awoke from anesthesia and taken to the PACU in stable condition.   Debridement type: Excisional Debridement  Side: right  Body Location: Elbow  Tools used for debridement: scalpel, scissors, curette, and rongeur  Pre-debridement Wound size (cm):   Length: 1        Width: 2     Depth: 1   Post-debridement Wound size (cm):  N/A-closed  Debridement depth beyond dead/damaged tissue down to healthy viable tissue: yes  Tissue layer involved: skin, subcutaneous tissue, muscle / fascia, bone  Nature of tissue removed: Devitalized Tissue and Non-viable tissue  Irrigation volume: 1L     Irrigation fluid type: Normal Saline   Post Op Plan/Instructions: I will allow the patient unrestricted range of motion.  She may use that arm for walker ambulation.  Will have her mobilize with therapy.  From an orthopedic perspective she can discharge home postoperative day 0 or 1.  Will have her follow-up in approximately 1 to 2 weeks for wound check and x-rays.  I was present and performed the entire surgery.  Franky Light, MD [facility]

## 2023-11-20 NOTE — Progress Notes (Signed)
 Patient was taken to OR at 0750 am.

## 2023-11-20 NOTE — Progress Notes (Signed)
 Patient is back from PACU. Alert and oriented. No pain complained. Will continue to monitor through out this shift.

## 2023-11-20 NOTE — Transfer of Care (Signed)
 Immediate Anesthesia Transfer of Care Note  Patient: Sherri [name]  A [place]  Procedure(s) Performed: OPEN REDUCTION INTERNAL FIXATION (ORIF) ELBOW/OLECRANON FRACTURE (Right: Elbow)  Patient Location: PACU  Anesthesia Type:General  Level of Consciousness: awake, alert , and oriented  Airway & Oxygen Therapy: Patient Spontanous Breathing and Patient connected to nasal cannula oxygen  Post-op Assessment: Report given to RN and Post -op Vital signs reviewed and stable  Post vital signs: Reviewed and stable  Last Vitals:  Vitals Value Taken Time  BP 128/66 11/20/23 10:21  Temp    Pulse 80 11/20/23 10:23  Resp 13 11/20/23 10:23  SpO2 100 % 11/20/23 10:23  Vitals shown include unfiled device data.  Last Pain:  Vitals:   11/20/23 0852  TempSrc:   PainSc: 0-No pain      Patients Stated Pain Goal: 2 (11/19/23 0837)  Complications: No notable events documented.

## 2023-11-20 NOTE — Progress Notes (Signed)
 AVS completed for discharge packet and placed with chart.

## 2023-11-20 NOTE — Interval H&P Note (Signed)
 History and Physical Interval Note:  11/20/2023 8:48 AM  Sherri [name]  has presented today for surgery, with the diagnosis of Right olecranon fracture with exposed bone.  The various methods of treatment have been discussed with the patient and family. After consideration of risks, benefits and other options for treatment, the patient has consented to  Procedure(s): OPEN REDUCTION INTERNAL FIXATION (ORIF) ELBOW/OLECRANON FRACTURE (Right) as a surgical intervention.  The patient's history has been reviewed, patient examined, no change in status, stable for surgery.  I have reviewed the patient's chart and labs.  Questions were answered to the patient's satisfaction.     Demiana Crumbley P Jakyrie Totherow

## 2023-11-20 NOTE — Plan of Care (Signed)
  Problem: Health Behavior/Discharge Planning: Goal: Ability to manage health-related needs will improve Outcome: Progressing   Problem: Clinical Measurements: Goal: Ability to maintain clinical measurements within normal limits will improve Outcome: Progressing Goal: Will remain free from infection Outcome: Progressing Goal: Diagnostic test results will improve Outcome: Progressing   Problem: Clinical Measurements: Goal: Will remain free from infection Outcome: Progressing   Problem: Activity: Goal: Risk for activity intolerance will decrease Outcome: Progressing   Problem: Nutrition: Goal: Adequate nutrition will be maintained Outcome: Progressing

## 2023-11-20 NOTE — Anesthesia Postprocedure Evaluation (Signed)
 Anesthesia Post Note  Patient: Sherri  [name]  Procedure(s) Performed: OPEN REDUCTION INTERNAL FIXATION (ORIF) ELBOW/OLECRANON FRACTURE (Right: Elbow)     Patient location during evaluation: PACU Anesthesia Type: General Level of consciousness: awake and alert Pain management: pain level controlled Vital Signs Assessment: post-procedure vital signs reviewed and stable Respiratory status: spontaneous breathing, nonlabored ventilation, respiratory function stable and patient connected to nasal cannula oxygen Cardiovascular status: blood pressure returned to baseline and stable Postop Assessment: no apparent nausea or vomiting Anesthetic complications: no  No notable events documented.  Last Vitals:  Vitals:   11/20/23 1057 11/20/23 1141  BP: 95/63 98/66  Pulse: 86 91  Resp: 12 19  Temp: 36.5 C (!) 36.3 C  SpO2: 99% 95%    Last Pain:  Vitals:   11/20/23 1021  TempSrc:   PainSc: 0-No pain                 Epifanio Lamar BRAVO

## 2023-11-20 NOTE — Progress Notes (Signed)
 OT Cancellation Note  Patient Details Name: Sherri  ALESANDRA [name] MRN: [id number] DOB: 15-Jul-1940   Cancelled Treatment:    Reason Eval/Treat Not Completed: Patient at procedure or test/ unavailable Off unit for surgery. Will follow up post op for OT re-eval.  Mliss Fish 11/20/2023, 8:38 AM

## 2023-11-23 ENCOUNTER — Telehealth: Payer: Self-pay | Admitting: *Deleted

## 2023-11-23 ENCOUNTER — Telehealth: Payer: Self-pay | Admitting: Rheumatology

## 2023-11-23 ENCOUNTER — Encounter (HOSPITAL_COMMUNITY): Payer: Self-pay | Admitting: Student

## 2023-11-23 DIAGNOSIS — D692 Other nonthrombocytopenic purpura: Secondary | ICD-10-CM | POA: Diagnosis not present

## 2023-11-23 DIAGNOSIS — Z7982 Long term (current) use of aspirin: Secondary | ICD-10-CM | POA: Diagnosis not present

## 2023-11-23 DIAGNOSIS — M4722 Other spondylosis with radiculopathy, cervical region: Secondary | ICD-10-CM | POA: Diagnosis not present

## 2023-11-23 DIAGNOSIS — G5602 Carpal tunnel syndrome, left upper limb: Secondary | ICD-10-CM | POA: Diagnosis not present

## 2023-11-23 DIAGNOSIS — Z860100 Personal history of colon polyps, unspecified: Secondary | ICD-10-CM | POA: Diagnosis not present

## 2023-11-23 DIAGNOSIS — M4712 Other spondylosis with myelopathy, cervical region: Secondary | ICD-10-CM | POA: Diagnosis not present

## 2023-11-23 DIAGNOSIS — E871 Hypo-osmolality and hyponatremia: Secondary | ICD-10-CM | POA: Diagnosis not present

## 2023-11-23 DIAGNOSIS — G35 Multiple sclerosis: Secondary | ICD-10-CM | POA: Diagnosis not present

## 2023-11-23 DIAGNOSIS — G4700 Insomnia, unspecified: Secondary | ICD-10-CM | POA: Diagnosis not present

## 2023-11-23 DIAGNOSIS — H9190 Unspecified hearing loss, unspecified ear: Secondary | ICD-10-CM | POA: Diagnosis not present

## 2023-11-23 DIAGNOSIS — M80031D Age-related osteoporosis with current pathological fracture, right forearm, subsequent encounter for fracture with routine healing: Secondary | ICD-10-CM | POA: Diagnosis not present

## 2023-11-23 DIAGNOSIS — R339 Retention of urine, unspecified: Secondary | ICD-10-CM | POA: Diagnosis not present

## 2023-11-23 DIAGNOSIS — D649 Anemia, unspecified: Secondary | ICD-10-CM | POA: Diagnosis not present

## 2023-11-23 DIAGNOSIS — T84020D Dislocation of internal right hip prosthesis, subsequent encounter: Secondary | ICD-10-CM | POA: Diagnosis not present

## 2023-11-23 DIAGNOSIS — K219 Gastro-esophageal reflux disease without esophagitis: Secondary | ICD-10-CM | POA: Diagnosis not present

## 2023-11-23 DIAGNOSIS — Z993 Dependence on wheelchair: Secondary | ICD-10-CM | POA: Diagnosis not present

## 2023-11-23 DIAGNOSIS — E876 Hypokalemia: Secondary | ICD-10-CM | POA: Diagnosis not present

## 2023-11-23 DIAGNOSIS — I10 Essential (primary) hypertension: Secondary | ICD-10-CM | POA: Diagnosis not present

## 2023-11-23 DIAGNOSIS — Z87891 Personal history of nicotine dependence: Secondary | ICD-10-CM | POA: Diagnosis not present

## 2023-11-23 DIAGNOSIS — E785 Hyperlipidemia, unspecified: Secondary | ICD-10-CM | POA: Diagnosis not present

## 2023-11-23 DIAGNOSIS — M80051D Age-related osteoporosis with current pathological fracture, right femur, subsequent encounter for fracture with routine healing: Secondary | ICD-10-CM | POA: Diagnosis not present

## 2023-11-23 DIAGNOSIS — M0689 Other specified rheumatoid arthritis, multiple sites: Secondary | ICD-10-CM | POA: Diagnosis not present

## 2023-11-23 DIAGNOSIS — Z85828 Personal history of other malignant neoplasm of skin: Secondary | ICD-10-CM | POA: Diagnosis not present

## 2023-11-23 NOTE — Telephone Encounter (Signed)
 Reached out to patient as her daughter is not on dpr. Advised patient as she just had surgery she will need clearance from the surgeon prior to resuming. Patient does request we speak with her daughter and will have her call the office.

## 2023-11-23 NOTE — Telephone Encounter (Signed)
 Patient's daughter Suzen called requesting to stop by the office to pick up samples of [name] .  Suzen states she only has a week's worth of medication left.  Patient requested a return call.

## 2023-11-23 NOTE — Transitions of Care (Post Inpatient/ED Visit) (Signed)
 11/23/2023  Name: Sherri [name] MRN: [id number] DOB: [birth year]  Today's TOC FU Call Status: Today's TOC FU Call Status:: Successful TOC FU Call Completed TOC FU Call Complete Date: 11/23/23 Patient's Name and Date of Birth confirmed.  Transition Care Management Follow-up Telephone Call Date of Discharge: 11/20/23 [facility] Name of Other ([facility]) Discharge Facility: [facility] Type of Discharge: Inpatient Admission Primary Inpatient Discharge Diagnosis:: (R) hip fracture with surgery and post- op complications How have you been since you were released from the hospital?:  (unable to determine- patient declined full TOC call) Any questions or concerns?: No Patient Questions/Concerns:: Patient declined majority of TOC call; states she has no concerns and just wants to know if a nurse is coming out to see her after the hospital visit: verified that home Health PT has been ordered post- hospital discharge; patient confirms PT is active: explained that there was no nurse ordered to make home visits: provided education around process to have home health nursing services added to PT: however, patient declined my assistance in making an PCP office visit for hospital follow up- stated she would do herself later Patient Questions/Concerns Addressed: Other: (provided education as above; offered to assist in facilitating making hospital follow up office visit with PCP- which patient declined)  Items Reviewed: Did you receive and understand the discharge instructions provided?:  (unable to determine- patient declined full TOC call) Medications obtained,verified, and reconciled?: No Medications Not Reviewed Reasons:: Other: (patient declined full TOC call) Any new allergies since your discharge?:  (unable to determine- patient declined full TOC call) Dietary orders reviewed?:  (unable to determine- patient declined full TOC call) Do you have support at home?:  (unable  to determine- patient declined full TOC call)  Medications Reviewed Today: Medications Reviewed Today     Reviewed by Uchenna Seufert M, RN (Registered Nurse) on 11/23/23 at [phone]  Med List Status: <None>   Medication Order Taking? Sig Documenting Provider Last Dose Status Informant  acetaminophen  (TYLENOL ) 325 MG tablet [id number]  Take 2 tablets (650 mg total) by mouth every 6 (six) hours as needed for mild pain (pain score 1-3) or fever (or Fever >/= 101). Dorinda Drue DASEN, MD  Active   aspirin  (ASPIRIN  CHILDRENS) 81 MG chewable tablet [id number]  Chew 1 tablet (81 mg total) by mouth 2 (two) times daily with a meal. Leigh Valery RAMAN, PA-C  Active   atorvastatin  (LIPITOR) 10 MG tablet [id number] No TAKE 1 TAB ON MONDAYS AND DAROLD [name] PARAS, MD 11/09/2023 Active Self, Pharmacy Records  Biotin  5000 MCG TABS [id number] No Take 10,000 mcg by mouth in the morning. [provider] 11/10/2023 Active Self, Pharmacy Records           Med Note STEPHEN, [facility]   Fri Apr 10, 2017  2:56 PM)    bisacodyl  (DULCOLAX) 10 MG suppository [id number]  Place 1 suppository (10 mg total) rectally daily as needed for moderate constipation. Dorinda Drue DASEN, MD  Active   Carboxymethylcellul-Glycerin  (LUBRICATING EYE DROPS OP) [id number] No Place 1 drop into both eyes daily as needed (dry eyes). [provider] Unknown Active Self, Pharmacy Records  carvedilol  (COREG ) 3.125 MG tablet [id number] No Take 1 tablet (3.125 mg total) by mouth 2 (two) times daily with a meal.  Patient taking differently: Take 0.5 tablets by mouth 2 (two) times daily with a meal.   [name] PARAS, MD 11/10/2023 Active Self, Pharmacy Records  chlorhexidine  (HIBICLENS ) 4 % external liquid [id number]  Apply 15 mLs (1 Application total) topically as directed for 30 doses. Use as directed daily for 5 days every other week for 6 weeks. Hill, Avery S, PA-C  Active   cholecalciferol  (VITAMIN D ) 25 MCG (1000 UNIT) tablet [id number] No Take 1,000 Units  by mouth in the morning. [provider] 11/10/2023 Active Self, Pharmacy Records  cyclobenzaprine  (FLEXERIL ) 10 MG tablet [id number] No TAKE 1 TABLET BY MOUTH THREE TIMES A DAY AS NEEDED FOR MUSCLE SPASM [name] PARAS, MD 11/09/2023 Bedtime Active Self, Pharmacy Records  denosumab  (PROLIA ) 60 MG/ML SOSY injection [id number] No Inject 60 mg into the skin every 6 (six) months. Deliver to rheum: [address], [place], [place] [address]. Appt on 07/23/2022 Cheryl Waddell HERO, PA-C Past Month Expired 11/10/23 2359 Self, Pharmacy Records           Med Note JACKOLYN WADDELL VEAR Pablo Oct 05, 2023  8:33 AM) Last dose 09/2023.  diazepam  (VALIUM ) 5 MG tablet [id number] No TAKE 1 TABLET BY MOUTH AT BEDTIME AS NEEDED FOR MUSCLE SPASMS. [name] PARAS, MD 11/08/2023 Active Self, Pharmacy Records  diclofenac sodium (VOLTAREN) 1 % GEL [id number] No APPLY 3 GRAMS TO 3 LARGE JOINTS 3 TIMES A DAY AS NEEDED Dolphus Reiter, MD Unknown Active Self, Pharmacy Records  docusate sodium  (COLACE) 100 MG capsule [id number]  Take 1 capsule (100 mg total) by mouth daily. Dorinda Drue DASEN, MD  Active   DULoxetine  (CYMBALTA ) 20 MG capsule [id number] No Take 40 mg by mouth at bedtime. [provider] 11/09/2023 Bedtime Active Self, Pharmacy Records  folic acid  (FOLVITE ) 1 MG tablet [id number] No Take 1 tablet (1 mg total) by mouth daily. Cheryl Waddell HERO, PA-C 11/10/2023 Active Self, Pharmacy Records  furosemide  (LASIX ) 20 MG tablet [id number] No TAKE 1 TABLET BY MOUTH EVERY DAY [name] PARAS, MD 11/10/2023 Active Self, Pharmacy Records  methotrexate  [facility]) 2.5 MG tablet [id number] No Take 5 tablets (12.5 mg total) by mouth once a week. Caution:Chemotherapy. Protect from light. Cheryl Waddell HERO, PA-C 11/04/2023 Active Self, Pharmacy Records           Med Note JACKOLYN WADDELL VEAR Pablo Oct 05, 2023  8:35 AM) Wednesday's  mupirocin  ointment (BACTROBAN ) 2 % [id number]  Place 1 Application into the nose 2 (two) times daily for  60 doses. Use as directed 2 times daily for 5 days every other week for 6 weeks. Leigh Serge S, PA-C  Active   oxybutynin  (DITROPAN ) 5 MG tablet [id number] No Take 5 mg by mouth daily. [provider] 11/10/2023 Active Self, Pharmacy Records  pantoprazole  (PROTONIX ) 40 MG tablet [id number] No Take 1 tablet (40 mg total) by mouth 2 (two) times daily before a meal. [name] PARAS, MD 11/10/2023 Active Self, Pharmacy Records  polyethylene glycol (MIRALAX  / GLYCOLAX ) 17 g packet [id number]  Take 17 g by mouth daily as needed for mild constipation. Dorinda Drue DASEN, MD  Active   predniSONE  (DELTASONE ) 5 MG tablet [id number] No TAKE 1 TABLET BY MOUTH EVERY DAY IN THE MORNING Cheryl Waddell HERO, PA-C 11/10/2023 Active Self, Pharmacy Records  Upadacitinib  ER ([name] ) 15 MG TB24 [id number] No Take 15 mg by mouth daily. [provider] 11/10/2023 Morning Active Self, Pharmacy Records           Med Note ALVIA, MARISSA C   Wed May 14, 2022 12:48 PM)             Home Care and Equipment/Supplies: Were Home Health  Services Ordered?: Yes Name of Home Health Agency:: [facility] Has Agency set up a time to come to your home?: Yes First Home Health Visit Date: 11/21/23 Any new equipment or medical supplies ordered?:  (unable to determine- patient declined full TOC call)  Functional Questionnaire: Do you need assistance with bathing/showering or dressing?:  (unable to determine- patient declined full TOC call) Do you need assistance with meal preparation?:  (unable to determine- patient declined full TOC call) Do you need assistance with eating?:  (unable to determine- patient declined full TOC call) Do you have difficulty maintaining continence:  (unable to determine- patient declined full TOC call) Do you need assistance with getting out of bed/getting out of a chair/moving?:  (unable to determine- patient declined full TOC call) Do you have difficulty managing or taking your  medications?:  (unable to determine- patient declined full TOC call)  Follow up appointments reviewed: PCP Follow-up appointment confirmed?: No (patient declined full TOC call/ my offer to assist in scheduling hospital follow up PCP office visit) MD Provider Line Number:[phone] Given: No (patient declined full TOC call- states well established with current PCP) Specialist Hospital Follow-up appointment confirmed?: Yes (per review of EHR/ discharge notes) Date of Specialist follow-up appointment?: 12/04/23 Follow-Up Specialty Provider:: orthopedic surgery Do you need transportation to your follow-up appointment?:  (unable to determine- patient declined full TOC call) Do you understand care options if your condition(s) worsen?:  (unable to determine- patient declined full TOC call)  SDOH Interventions Today    Flowsheet Row Most Recent Value  SDOH Interventions   Food Insecurity Interventions Patient Declined  Housing Interventions Patient Declined  Transportation Interventions Patient Declined  Utilities Interventions Patient Declined   Patient declines need for ongoing/ further care management/ coordination outreach; declines enrollment in 30-day TOC program- declines taking my direct phone number should needs/ concerns arise post-TOC call   See TOC assessment tabs for additional assessment/ TOC intervention information  Pls call/ message for questions,  Cristella Stiver Mckinney Shelva Hetzer, RN, BSN, Media planner  Transitions of Care  [facility]  [place] [phone]: direct office

## 2023-11-23 NOTE — Telephone Encounter (Signed)
 Spoke with patient's daughter and advised her patient will need clearance from the surgeon prior to resuming [name]  and she expressed understanding.

## 2023-11-25 ENCOUNTER — Ambulatory Visit: Payer: Medicare Other | Admitting: Rheumatology

## 2023-11-25 ENCOUNTER — Other Ambulatory Visit: Payer: Self-pay | Admitting: Physician Assistant

## 2023-11-25 DIAGNOSIS — M810 Age-related osteoporosis without current pathological fracture: Secondary | ICD-10-CM

## 2023-11-25 DIAGNOSIS — G2581 Restless legs syndrome: Secondary | ICD-10-CM

## 2023-11-25 DIAGNOSIS — K219 Gastro-esophageal reflux disease without esophagitis: Secondary | ICD-10-CM

## 2023-11-25 DIAGNOSIS — S62101D Fracture of unspecified carpal bone, right wrist, subsequent encounter for fracture with routine healing: Secondary | ICD-10-CM

## 2023-11-25 DIAGNOSIS — Z96642 Presence of left artificial hip joint: Secondary | ICD-10-CM

## 2023-11-25 DIAGNOSIS — M4312 Spondylolisthesis, cervical region: Secondary | ICD-10-CM

## 2023-11-25 DIAGNOSIS — Z8639 Personal history of other endocrine, nutritional and metabolic disease: Secondary | ICD-10-CM

## 2023-11-25 DIAGNOSIS — M67431 Ganglion, right wrist: Secondary | ICD-10-CM

## 2023-11-25 DIAGNOSIS — Z872 Personal history of diseases of the skin and subcutaneous tissue: Secondary | ICD-10-CM

## 2023-11-25 DIAGNOSIS — F5101 Primary insomnia: Secondary | ICD-10-CM

## 2023-11-25 DIAGNOSIS — M0579 Rheumatoid arthritis with rheumatoid factor of multiple sites without organ or systems involvement: Secondary | ICD-10-CM

## 2023-11-25 DIAGNOSIS — E559 Vitamin D deficiency, unspecified: Secondary | ICD-10-CM

## 2023-11-25 DIAGNOSIS — G35 Multiple sclerosis: Secondary | ICD-10-CM

## 2023-11-25 DIAGNOSIS — Z8659 Personal history of other mental and behavioral disorders: Secondary | ICD-10-CM

## 2023-11-25 DIAGNOSIS — Z79899 Other long term (current) drug therapy: Secondary | ICD-10-CM

## 2023-11-25 DIAGNOSIS — S62102D Fracture of unspecified carpal bone, left wrist, subsequent encounter for fracture with routine healing: Secondary | ICD-10-CM

## 2023-11-25 DIAGNOSIS — Z7952 Long term (current) use of systemic steroids: Secondary | ICD-10-CM

## 2023-11-25 DIAGNOSIS — M79642 Pain in left hand: Secondary | ICD-10-CM

## 2023-11-25 DIAGNOSIS — Z8679 Personal history of other diseases of the circulatory system: Secondary | ICD-10-CM

## 2023-11-25 DIAGNOSIS — M51369 Other intervertebral disc degeneration, lumbar region without mention of lumbar back pain or lower extremity pain: Secondary | ICD-10-CM

## 2023-11-25 DIAGNOSIS — R5383 Other fatigue: Secondary | ICD-10-CM

## 2023-11-25 DIAGNOSIS — M1611 Unilateral primary osteoarthritis, right hip: Secondary | ICD-10-CM

## 2023-11-25 NOTE — Telephone Encounter (Signed)
 Patient recently had surgery and will not need till a clearance is received.

## 2023-11-26 ENCOUNTER — Inpatient Hospital Stay (HOSPITAL_COMMUNITY)
Admission: EM | Admit: 2023-11-26 | Discharge: 2023-12-08 | DRG: 908 | Disposition: A | Discharge status: Rehabilitation Facility | Attending: Family Medicine | Admitting: Family Medicine

## 2023-11-26 ENCOUNTER — Other Ambulatory Visit: Payer: Self-pay

## 2023-11-26 DIAGNOSIS — L03115 Cellulitis of right lower limb: Secondary | ICD-10-CM | POA: Diagnosis not present

## 2023-11-26 DIAGNOSIS — Z515 Encounter for palliative care: Secondary | ICD-10-CM | POA: Diagnosis not present

## 2023-11-26 DIAGNOSIS — E861 Hypovolemia: Secondary | ICD-10-CM | POA: Diagnosis not present

## 2023-11-26 DIAGNOSIS — T8189XA Other complications of procedures, not elsewhere classified, initial encounter: Secondary | ICD-10-CM | POA: Diagnosis not present

## 2023-11-26 DIAGNOSIS — D692 Other nonthrombocytopenic purpura: Secondary | ICD-10-CM | POA: Diagnosis not present

## 2023-11-26 DIAGNOSIS — S52021K Displaced fracture of olecranon process without intraarticular extension of right ulna, subsequent encounter for closed fracture with nonunion: Secondary | ICD-10-CM | POA: Diagnosis not present

## 2023-11-26 DIAGNOSIS — G5602 Carpal tunnel syndrome, left upper limb: Secondary | ICD-10-CM | POA: Diagnosis not present

## 2023-11-26 DIAGNOSIS — G4700 Insomnia, unspecified: Secondary | ICD-10-CM | POA: Diagnosis not present

## 2023-11-26 DIAGNOSIS — Z833 Family history of diabetes mellitus: Secondary | ICD-10-CM

## 2023-11-26 DIAGNOSIS — F0390 Unspecified dementia without behavioral disturbance: Secondary | ICD-10-CM | POA: Diagnosis present

## 2023-11-26 DIAGNOSIS — T8130XA Disruption of wound, unspecified, initial encounter: Secondary | ICD-10-CM | POA: Diagnosis present

## 2023-11-26 DIAGNOSIS — T8133XA Disruption of traumatic injury wound repair, initial encounter: Principal | ICD-10-CM | POA: Diagnosis present

## 2023-11-26 DIAGNOSIS — R58 Hemorrhage, not elsewhere classified: Secondary | ICD-10-CM | POA: Diagnosis not present

## 2023-11-26 DIAGNOSIS — Y838 Other surgical procedures as the cause of abnormal reaction of the patient, or of later complication, without mention of misadventure at the time of the procedure: Secondary | ICD-10-CM | POA: Diagnosis present

## 2023-11-26 DIAGNOSIS — M0579 Rheumatoid arthritis with rheumatoid factor of multiple sites without organ or systems involvement: Secondary | ICD-10-CM | POA: Diagnosis not present

## 2023-11-26 DIAGNOSIS — D539 Nutritional anemia, unspecified: Secondary | ICD-10-CM | POA: Diagnosis not present

## 2023-11-26 DIAGNOSIS — Z87891 Personal history of nicotine dependence: Secondary | ICD-10-CM

## 2023-11-26 DIAGNOSIS — T8131XA Disruption of external operation (surgical) wound, not elsewhere classified, initial encounter: Secondary | ICD-10-CM | POA: Diagnosis not present

## 2023-11-26 DIAGNOSIS — E871 Hypo-osmolality and hyponatremia: Secondary | ICD-10-CM | POA: Diagnosis present

## 2023-11-26 DIAGNOSIS — K219 Gastro-esophageal reflux disease without esophagitis: Secondary | ICD-10-CM | POA: Diagnosis not present

## 2023-11-26 DIAGNOSIS — F419 Anxiety disorder, unspecified: Secondary | ICD-10-CM | POA: Diagnosis present

## 2023-11-26 DIAGNOSIS — I10 Essential (primary) hypertension: Secondary | ICD-10-CM | POA: Diagnosis not present

## 2023-11-26 DIAGNOSIS — L89151 Pressure ulcer of sacral region, stage 1: Secondary | ICD-10-CM | POA: Clinically undetermined

## 2023-11-26 DIAGNOSIS — T84020A Dislocation of internal right hip prosthesis, initial encounter: Secondary | ICD-10-CM | POA: Diagnosis not present

## 2023-11-26 DIAGNOSIS — T84228A Displacement of internal fixation device of other bones, initial encounter: Secondary | ICD-10-CM | POA: Diagnosis not present

## 2023-11-26 DIAGNOSIS — D62 Acute posthemorrhagic anemia: Secondary | ICD-10-CM | POA: Diagnosis not present

## 2023-11-26 DIAGNOSIS — G8929 Other chronic pain: Secondary | ICD-10-CM | POA: Diagnosis present

## 2023-11-26 DIAGNOSIS — M069 Rheumatoid arthritis, unspecified: Secondary | ICD-10-CM | POA: Diagnosis present

## 2023-11-26 DIAGNOSIS — G35 Multiple sclerosis: Secondary | ICD-10-CM | POA: Diagnosis present

## 2023-11-26 DIAGNOSIS — Z79899 Other long term (current) drug therapy: Secondary | ICD-10-CM | POA: Diagnosis not present

## 2023-11-26 DIAGNOSIS — Z8614 Personal history of Methicillin resistant Staphylococcus aureus infection: Secondary | ICD-10-CM

## 2023-11-26 DIAGNOSIS — Z8249 Family history of ischemic heart disease and other diseases of the circulatory system: Secondary | ICD-10-CM

## 2023-11-26 DIAGNOSIS — M80031D Age-related osteoporosis with current pathological fracture, right forearm, subsequent encounter for fracture with routine healing: Secondary | ICD-10-CM | POA: Diagnosis not present

## 2023-11-26 DIAGNOSIS — E876 Hypokalemia: Secondary | ICD-10-CM | POA: Diagnosis not present

## 2023-11-26 DIAGNOSIS — Z90710 Acquired absence of both cervix and uterus: Secondary | ICD-10-CM

## 2023-11-26 DIAGNOSIS — L03113 Cellulitis of right upper limb: Secondary | ICD-10-CM | POA: Diagnosis not present

## 2023-11-26 DIAGNOSIS — W19XXXD Unspecified fall, subsequent encounter: Secondary | ICD-10-CM | POA: Diagnosis present

## 2023-11-26 DIAGNOSIS — Z85828 Personal history of other malignant neoplasm of skin: Secondary | ICD-10-CM

## 2023-11-26 DIAGNOSIS — Z880 Allergy status to penicillin: Secondary | ICD-10-CM

## 2023-11-26 DIAGNOSIS — M80051D Age-related osteoporosis with current pathological fracture, right femur, subsequent encounter for fracture with routine healing: Secondary | ICD-10-CM | POA: Diagnosis not present

## 2023-11-26 DIAGNOSIS — Z7189 Other specified counseling: Secondary | ICD-10-CM | POA: Diagnosis not present

## 2023-11-26 DIAGNOSIS — H9190 Unspecified hearing loss, unspecified ear: Secondary | ICD-10-CM | POA: Diagnosis not present

## 2023-11-26 DIAGNOSIS — E785 Hyperlipidemia, unspecified: Secondary | ICD-10-CM | POA: Diagnosis present

## 2023-11-26 DIAGNOSIS — Z993 Dependence on wheelchair: Secondary | ICD-10-CM | POA: Diagnosis not present

## 2023-11-26 DIAGNOSIS — T819XXA Unspecified complication of procedure, initial encounter: Secondary | ICD-10-CM | POA: Diagnosis not present

## 2023-11-26 DIAGNOSIS — R1030 Lower abdominal pain, unspecified: Secondary | ICD-10-CM | POA: Diagnosis not present

## 2023-11-26 DIAGNOSIS — R339 Retention of urine, unspecified: Secondary | ICD-10-CM | POA: Diagnosis present

## 2023-11-26 DIAGNOSIS — Z8507 Personal history of malignant neoplasm of pancreas: Secondary | ICD-10-CM

## 2023-11-26 DIAGNOSIS — Z860100 Personal history of colon polyps, unspecified: Secondary | ICD-10-CM

## 2023-11-26 DIAGNOSIS — Y792 Prosthetic and other implants, materials and accessory orthopedic devices associated with adverse incidents: Secondary | ICD-10-CM | POA: Diagnosis present

## 2023-11-26 DIAGNOSIS — Z885 Allergy status to narcotic agent status: Secondary | ICD-10-CM

## 2023-11-26 DIAGNOSIS — Z800 Family history of malignant neoplasm of digestive organs: Secondary | ICD-10-CM

## 2023-11-26 DIAGNOSIS — R1310 Dysphagia, unspecified: Secondary | ICD-10-CM | POA: Diagnosis not present

## 2023-11-26 DIAGNOSIS — Z820 Family history of epilepsy and other diseases of the nervous system: Secondary | ICD-10-CM

## 2023-11-26 DIAGNOSIS — Z7982 Long term (current) use of aspirin: Secondary | ICD-10-CM | POA: Diagnosis not present

## 2023-11-26 DIAGNOSIS — N3281 Overactive bladder: Secondary | ICD-10-CM | POA: Diagnosis present

## 2023-11-26 DIAGNOSIS — T84020D Dislocation of internal right hip prosthesis, subsequent encounter: Secondary | ICD-10-CM | POA: Diagnosis not present

## 2023-11-26 DIAGNOSIS — M4712 Other spondylosis with myelopathy, cervical region: Secondary | ICD-10-CM | POA: Diagnosis not present

## 2023-11-26 DIAGNOSIS — M4722 Other spondylosis with radiculopathy, cervical region: Secondary | ICD-10-CM | POA: Diagnosis not present

## 2023-11-26 DIAGNOSIS — D649 Anemia, unspecified: Secondary | ICD-10-CM | POA: Diagnosis not present

## 2023-11-26 DIAGNOSIS — M0689 Other specified rheumatoid arthritis, multiple sites: Secondary | ICD-10-CM | POA: Diagnosis not present

## 2023-11-26 MED ORDER — DULOXETINE HCL 20 MG PO CPEP
20.0000 mg | ORAL_CAPSULE | Freq: Once | ORAL | Status: AC
Start: 2023-11-26 — End: 2023-11-27
  Administered 2023-11-27: 20 mg via ORAL
  Filled 2023-11-26: qty 1

## 2023-11-26 MED ORDER — CARVEDILOL 3.125 MG PO TABS: 3.1250 mg | ORAL_TABLET | Freq: Two times a day (BID) | ORAL | Status: DC

## 2023-11-26 NOTE — ED Triage Notes (Signed)
 Pt BIB EMS from home. EMS repports pt had elbow surgery 1 wk ago and was sent to ed by home health for exposed hardware from surgery site. Elbow sressing and wrap in place.  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

## 2023-11-26 NOTE — Consult Note (Signed)
 Chief Complaint: right elbow visible hardware.  HPI: Sherri [name] is a 83 y.o. female with history of dementia, anxiety, GERD, rheumatoid arthritis, hypertension, multiple sclerosis, lipidemia who presents to the [facility] ED. Please see recent hospital admissions/history below. Earlier today her home health nurse was taking off her right elbow dressings and found her screw from previous right elbow ORIF was visible.   5/9-5/14 - she was admitted for sepsis due to right lower extremity cellulitis. She was discharged to SNF.  5/14 - she fell while at [facility] and was found to have a right olecranon fracture, orthopedics was consulted. It was determined that this would be treated non-operatively and patient was discharged back to SNF.  6/17 - She followed up with her PCP complaining of severe right hip pain since the fall on 5/14. X-rays were taken of her hip and she was found to have a right femoral neck fracture. 6/19 - Dr. Fidel performed a right total hip arthroplasty for fracture 6/22 - while inpatient, patient complained of hip popping out and was found to have dislocated her right THA 6/23 - closed reduction of right THA performed by Dr. Ernie 6/25 - skin breakdown at right olecranon visualized, exposed bone 6/27- Dr. [name] performed exploration and debridement of elbow with ORIF of right olecranon fracture 6/27 - patient did not want to discharge back to SNF, discharged home on keflex  7/3 - patient presents with open wound of right elbow with visible hardward from olecranon ORIF  At this time she denies pain at the elbow or right hip. Denies CP, shortness of breath, nausea, vomiting, or abdominal pain. Denies paraesthesias.    Past Medical History:  Diagnosis Date   Anxiety    takes Valium  daily as needed   Basal cell carcinoma 01/21/1988   left nostril (MOHS), sup-right calf (CX35FU)   Basal cell carcinoma 03/22/1996   right calf (CX35FU)   Basal cell carcinoma 03/31/1995   left  wing nose   Bruises easily    d/t meds   Cancer (HCC)    basal cell ca, in situ- uterine    Cataracts, bilateral    removed bilateral   Chronic back pain    Dizziness    r/t to meds   GERD (gastroesophageal reflux disease)    no meds on a regular basis but will take Tums if needed   Headache(784.0)    r/t neck issues   History of bronchitis 6-82yrs ago   History of colon polyps    benign   HOH (hard of hearing)    wears hearing aids   Hyperlipidemia    takes Atorvastatin  on Mondays and Fridays   Hypertension    Joint pain    Joint swelling    Multiple sclerosis (HCC)    Neuromuscular disorder (HCC)    Dr. FABIENE Crete- [facility], follows [facility]   Nocturia    PONV (postoperative nausea and vomiting)    trouble urinating after surgery in 2014   Postoperative nausea and vomiting 01/11/2019   Rheumatoid arthritis (HCC)    Dr Lorrayne weekly, RA- hands- knees- feet    Rheumatoid arthritis(714.0)    Dr Dolphus takes Xeljanz  daily   Right wrist fracture    Skin cancer    Squamous cell carcinoma of skin 11/02/2001   in situ-right knee (cx28fu)   Squamous cell carcinoma of skin 11/12/2011   in situ-left forearm (CX35FU), in situ-left foot (CX35FU)   Squamous cell carcinoma of skin 01/22/2012   in  situ-left lower forearm (txpbx)   Squamous cell carcinoma of skin 11/17/2012   right shin (txpbx)   Squamous cell carcinoma of skin 10/28/2013   in situ-right shoulder (CX35FU)   Squamous cell carcinoma of skin 04/28/2014   well diff-right shin (txpbx)   Squamous cell carcinoma of skin 11/02/2014   in situ-Left shin (txpbx)   Squamous cell carcinoma of skin 12/15/2014   in situ-Left hand (txpbx), bowens-left side chest (txpbx)   Squamous cell carcinoma of skin 05/09/2015   in situ-left hand (txpbx)    Squamous cell carcinoma of skin 05/07/2016   in situ-left inner shin,ant, in situ-left inner shin, post, in situ-left outer forearm, in situ-right knuckle    Squamous cell carcinoma of skin 03/1302018   in situ-left shoulder (txpbx), in situ-right inner shin (txpbx), in situ-top of left foot (txpbx), KA- left forearm (txpbx)   Squamous cell carcinoma of skin 12/02/2016   in situ-left inner shin (txpbx)   Squamous cell carcinoma of skin 03/04/2017   in situ-left outer sup, shin (txpbx), in situ- right 2nd knuckle finger (txpbx), in situ- Left wrist (txpbx)   Squamous cell carcinoma of skin 04/06/2018   in situ-above left knee inner (txpbx)   Squamous cell carcinoma of skin 04/21/2018   in situ-right top hand (txpbx)   Squamous cell carcinoma of skin 07/08/2018   in situ-right lower inner shin (txpbx)   Squamous cell carcinoma of skin 04/27/2019   in situ- Left neck(CX35FU), In situ- right neck (CX35FU)   Urinary retention    sees Dr.Wrenn about 2 times a yr   Past Surgical History:  Procedure Laterality Date   ABDOMINAL HYSTERECTOMY     1985   ANTERIOR CERVICAL DECOMP/DISCECTOMY FUSION N/A 04/23/2022   Procedure: Anterior Cervical Decompression/Discectomy Fusion - Cervical Seven-Thoracic One;  Surgeon: Louis Shove, MD;  Location: [facility] OR;  Service: Neurosurgery;  Laterality: N/A;  3C   APPENDECTOMY     with TAH   BACK SURGERY     several   BREAST BIOPSY Right    Results were negative   BROW LIFT Bilateral 10/02/2016   Procedure: BILATERAL LOWER LID BLEPHAROPLASTY;  Surgeon: Laurie Loyd Redhead, MD;  Location: [facility] OR;  Service: Plastics;  Laterality: Bilateral;   CARPAL TUNNEL RELEASE Right    cataracts     CERVICAL FUSION     Dr Leeann   CERVICAL FUSION  12/31/2012   Dr Leeann   CERVICAL FUSION  04/2022   CHEST TUBE INSERTION     for traumatic Pneumothorax   COLONOSCOPY  07/16/2010   normal    COLONOSCOPY W/ POLYPECTOMY  1997   negative since; Dr Debrah   ESOPHAGOGASTRODUODENOSCOPY  07/16/2010   normal   eye lid raise     EYE SURGERY Bilateral    cataracts removed - /w IOL   HARDWARE REMOVAL  08/2021   HIP CLOSED REDUCTION  Right 11/16/2023   Procedure: CLOSED MANIPULATION, JOINT, HIP;  Surgeon: Ernie Cough, MD;  Location: [facility];  Service: Orthopedics;  Laterality: Right;   JOINT REPLACEMENT     LUMBAR FUSION     Dr Leeann   NASAL SINUS SURGERY     ORIF ELBOW FRACTURE Right 11/20/2023   Procedure: OPEN REDUCTION INTERNAL FIXATION (ORIF) ELBOW/OLECRANON FRACTURE;  Surgeon: [name] Franky SQUIBB, MD;  Location: [facility] OR;  Service: Orthopedics;  Laterality: Right;   POSTERIOR CERVICAL FUSION/FORAMINOTOMY N/A 12/31/2012   Procedure: POSTERIOR LATERAL CERVICAL FUSION/FORAMINOTOMY LEVEL 1 CERVICAL THREE-FOUR WITH LATERAL MASS SCREWS;  Surgeon: Catalina  CHRISTELLA Stains, MD;  Location: [facility];  Service: Neurosurgery;  Laterality: N/A;   PTOSIS REPAIR Bilateral 10/02/2016   Procedure: INTERNAL PTOSIS REPAIR;  Surgeon: Laurie Loyd Redhead, MD;  Location: [facility] OR;  Service: Plastics;  Laterality: Bilateral;   SKIN BIOPSY     THYROID  SURGERY     R lobe removed- 1972, has grown back - CT last done- 2018   TOTAL HIP ARTHROPLASTY  12/22/2011   Procedure: TOTAL HIP ARTHROPLASTY;  Surgeon: Dempsey JINNY Sensor, MD;  Location: [facility] OR;  Service: Orthopedics;  Laterality: Left;   TOTAL HIP ARTHROPLASTY Right 11/12/2023   Procedure: ARTHROPLASTY, HIP, TOTAL, ANTERIOR APPROACH;  Surgeon: Fidel Rogue, MD;  Location: [facility];  Service: Orthopedics;  Laterality: Right;   TOTAL SHOULDER ARTHROPLASTY     TUBAL LIGATION     UPPER GASTROINTESTINAL ENDOSCOPY  2012   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   WRIST SURGERY Left 09/16/2022   wrist/arm   Social History   Socioeconomic History   Marital status: Married    Spouse name: Cheryl   Number of children: 2   Years of education: Not on file   Highest education level: Not on file  Occupational History   Occupation: retired  Tobacco Use   Smoking status: Former    Current packs/day: 0.00    Average packs/day: 2.5 packs/day for 20.0 years (50.0 ttl pk-yrs)    Types: Cigarettes    Start date:  05/26/1961    Quit date: 05/26/1981    Years since quitting: 42.5    Passive exposure: Never   Smokeless tobacco: Never   Tobacco comments:    Smoked [phone], up to 2.5 ppd  Vaping Use   Vaping status: Never Used  Substance and Sexual Activity   Alcohol  use: Yes    Alcohol /week: 14.0 standard drinks of alcohol     Types: 14 Cans of beer per week    Comment: 2% beer   Drug use: Never   Sexual activity: Yes    Birth control/protection: Surgical    Comment: Hysterectomy  Other Topics Concern   Not on file  Social History Narrative   Not on file   Social Drivers of Health   Financial Resource Strain: Low Risk  (02/21/2022)   Overall Financial Resource Strain (CARDIA)    Difficulty of Paying Living Expenses: Not hard at all  Food Insecurity: Patient Declined (11/23/2023)   Hunger Vital Sign    Worried About Running Out of Food in the Last Year: Patient declined    Ran Out of Food in the Last Year: Patient declined  Transportation Needs: Patient Declined (11/23/2023)   PRAPARE - Administrator, Civil Service (Medical): Patient declined    Lack of Transportation (Non-Medical): Patient declined  Physical Activity: Inactive (02/21/2022)   Exercise Vital Sign    Days of Exercise per Week: 0 days    Minutes of Exercise per Session: 0 min  Stress: Stress Concern Present (02/21/2022)   [employer] of Occupational Health - Occupational Stress Questionnaire    Feeling of Stress : Rather much  Social Connections: Socially Integrated (11/10/2023)   Social Connection and Isolation Panel    Frequency of Communication with Friends and Family: More than three times a week    Frequency of Social Gatherings with Friends and Family: Three times a week    Attends Religious Services: 1 to 4 times per year    Active Member of Clubs or Organizations: Yes    Attends  Club or Organization Meetings: 1 to 4 times per year    Marital Status: Married  Recent Concern: Social Connections -  Moderately Isolated (10/03/2023)   Social Connection and Isolation Panel    Frequency of Communication with Friends and Family: More than three times a week    Frequency of Social Gatherings with Friends and Family: More than three times a week    Attends Religious Services: Never    Database administrator or Organizations: No    Attends Engineer, structural: Never    Marital Status: Married   Family History  Problem Relation Age of Onset   Cancer Mother        pancreatic   Diabetes Mother    Pancreatic cancer Mother    Heart disease Father        Rheumatic   Cancer Father        ? stomach   Stomach cancer Father    Cancer Sister        stomach   Stomach cancer Sister    Cancer Maternal Aunt        X 4; ? primary   Heart disease Paternal Aunt    Cancer Maternal Grandmother        cervical   Colon cancer Other        Aunts   Multiple sclerosis Daughter    Hypertension Neg Hx    Stroke Neg Hx    Esophageal cancer Neg Hx    Rectal cancer Neg Hx    Allergies  Allergen Reactions   Darvon [Propoxyphene Hcl] Shortness Of Breath    ? Dose Related ? Lowered respirations greatly   Demerol [Meperidine] Shortness Of Breath and Other (See Comments)    Respiratory Distress   Penicillins Hives and Other (See Comments)    Has patient had a PCN reaction causing immediate rash, facial/tongue/throat swelling, SOB or lightheadedness with hypotension: No SEVERE RASH INVOLVING MUCUS MEMBRANES or SKIN NECROSIS: #  #  #  YES  #  #  #  Has patient had a PCN reaction that required hospitalization No Has patient had a PCN reaction occurring within the last 10 years: No.    Imuran [Azathioprine] Other (See Comments)    Weakness   Sulfa  Antibiotics Nausea Only and Other (See Comments)    States had severe indigestion and heartburn and nausea and had to stop taking   Arava [Leflunomide] Other (See Comments)    Excessive weight gain   Lipitor [Atorvastatin ] Nausea And Vomiting     Currently taking 2 times weekly - able to tolerate low dose    Vytorin [Ezetimibe-Simvastatin] Nausea And Vomiting   Prior to Admission medications   Medication Sig Start Date End Date Taking? Authorizing Provider  acetaminophen  (TYLENOL ) 325 MG tablet Take 2 tablets (650 mg total) by mouth every 6 (six) hours as needed for mild pain (pain score 1-3) or fever (or Fever >/= 101). 11/20/23   Dorinda Drue DASEN, MD  aspirin  (ASPIRIN  CHILDRENS) 81 MG chewable tablet Chew 1 tablet (81 mg total) by mouth 2 (two) times daily with a meal. 11/13/23 12/28/23  Hill, Valery RAMAN, PA-C  atorvastatin  (LIPITOR) 10 MG tablet TAKE 1 TAB ON MONDAYS AND FRIDAY 02/26/23   [name] PARAS, MD  Biotin  5000 MCG TABS Take 10,000 mcg by mouth in the morning.    [provider]  bisacodyl  (DULCOLAX) 10 MG suppository Place 1 suppository (10 mg total) rectally daily as needed for moderate constipation.  11/20/23   Dorinda Drue DASEN, MD  Carboxymethylcellul-Glycerin  (LUBRICATING EYE DROPS OP) Place 1 drop into both eyes daily as needed (dry eyes).    [provider]  carvedilol  (COREG ) 3.125 MG tablet Take 1 tablet (3.125 mg total) by mouth 2 (two) times daily with a meal. Patient taking differently: Take 0.5 tablets by mouth 2 (two) times daily with a meal. 11/10/23   Burns, Glade PARAS, MD  chlorhexidine  (HIBICLENS ) 4 % external liquid Apply 15 mLs (1 Application total) topically as directed for 30 doses. Use as directed daily for 5 days every other week for 6 weeks. 11/12/23   Hill, Valery RAMAN, PA-C  cholecalciferol  (VITAMIN D ) 25 MCG (1000 UNIT) tablet Take 1,000 Units by mouth in the morning.    [provider]  cyclobenzaprine  (FLEXERIL ) 10 MG tablet TAKE 1 TABLET BY MOUTH THREE TIMES A DAY AS NEEDED FOR MUSCLE SPASM 06/11/23   [name] PARAS, MD  denosumab  (PROLIA ) 60 MG/ML SOSY injection Inject 60 mg into the skin every 6 (six) months. Deliver to rheum: [address], [place], [place] [address]. Appt on 07/23/2022  07/17/22 11/10/23  Cheryl Waddell HERO, PA-C  diazepam  (VALIUM ) 5 MG tablet TAKE 1 TABLET BY MOUTH AT BEDTIME AS NEEDED FOR MUSCLE SPASMS. 04/28/23   [name] PARAS, MD  diclofenac sodium (VOLTAREN) 1 % GEL APPLY 3 GRAMS TO 3 LARGE JOINTS 3 TIMES A DAY AS NEEDED 01/27/18   Dolphus Reiter, MD  docusate sodium  (COLACE) 100 MG capsule Take 1 capsule (100 mg total) by mouth daily. 11/21/23   Dorinda Drue DASEN, MD  DULoxetine  (CYMBALTA ) 20 MG capsule Take 40 mg by mouth at bedtime.    [provider]  folic acid  (FOLVITE ) 1 MG tablet Take 1 tablet (1 mg total) by mouth daily. 09/02/23   Cheryl Waddell HERO, PA-C  furosemide  (LASIX ) 20 MG tablet TAKE 1 TABLET BY MOUTH EVERY DAY 08/19/23   [name] PARAS, MD  methotrexate  (RHEUMATREX) 2.5 MG tablet Take 5 tablets (12.5 mg total) by mouth once a week. Caution:Chemotherapy. Protect from light. 09/02/23   Cheryl Waddell HERO, PA-C  mupirocin  ointment (BACTROBAN ) 2 % Place 1 Application into the nose 2 (two) times daily for 60 doses. Use as directed 2 times daily for 5 days every other week for 6 weeks. 11/12/23 12/12/23  Leigh Valery RAMAN, PA-C  oxybutynin  (DITROPAN ) 5 MG tablet Take 5 mg by mouth daily. 11/06/23   [provider]  pantoprazole  (PROTONIX ) 40 MG tablet Take 1 tablet (40 mg total) by mouth 2 (two) times daily before a meal. 07/27/23   Burns, Glade PARAS, MD  polyethylene glycol (MIRALAX  / GLYCOLAX ) 17 g packet Take 17 g by mouth daily as needed for mild constipation. 11/20/23   Dorinda Drue DASEN, MD  predniSONE  (DELTASONE ) 5 MG tablet TAKE 1 TABLET BY MOUTH EVERY DAY IN THE MORNING 05/22/23   Cheryl Waddell HERO, PA-C  Upadacitinib  ER ([name] ) 15 MG TB24 Take 15 mg by mouth daily.    [provider]     Positive ROS: All other systems have been reviewed and were otherwise negative with the exception of those mentioned in the HPI and as above.  Physical Exam: General: Alert, no acute distress Cardiovascular: No pedal edema Respiratory: No cyanosis, no use of  accessory musculature GI: No organomegaly, abdomen is soft and non-tender Skin: See pictures of skin at right elbow  Neurologic: Sensation intact distally Psychiatric: Patient is competent for consent with normal mood  and affect Lymphatic: No axillary or cervical lymphadenopathy  MUSCULOSKELETAL:  RUE - dressings removed. Replaced with xeroform, gauze, abds, and ace wrap. All fingers flex, extend and abduct though ROM is limited due to severe arthritis at multiple joints of fingers. Sensation intact diffusely to hand and fingers. + radial pulse.  RLE - in hip spica brace. Dorsiflexion and plantarflexion intact. No erythema to skin of RLE. Sensation intact to foot. + DP pulse.        Assessment/Plan: S/p right olecranon ORIF with skin breakdown and visible hardware.  - tested + for MRSA on 6/19  - Discussed plan with Dr. Josefina. This is an acute severe issue with significant risk for joint infection. Plan to keep NPO after midnight for possible irrigation and debridement of right elbow. In the morning, with discuss plan with Dr. [name] and Dr. Edna who is on call with our group tomorrow.   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 Follow up with Dr. [place]. Ernie as outpatient   Army MARLA Daring, PA-C   11/26/2023 11:28 PM

## 2023-11-26 NOTE — ED Notes (Signed)
 ED Provider at bedside.

## 2023-11-26 NOTE — ED Provider Notes (Signed)
 [facility] EMERGENCY DEPARTMENT AT [facility] Provider Note   CSN: [id number] Arrival date & time: 11/26/23  2002     Patient presents with: No chief complaint on file.   Sherri [name] is a 83 y.o. female for concerns of a exposed surgical hardware from previous elbow surgery 1 week prior.  Review of prior visits shows that on 27 June she had an open reduction and internal fixation of the right olecranon.  Was discharged from facility on 27 June.  Comes in today by EMS as home health was concerned for the exposed surgical hardware through the previous surgical site.  Also noted to have a closed fracture of the right hip, with binder in place from previous repair.  No complaints regarding this at this time.  Primary concern is the exposed surgical hardware for the right elbow.   HPI     Prior to Admission medications   Medication Sig Start Date End Date Taking? Authorizing Provider  acetaminophen  (TYLENOL ) 325 MG tablet Take 2 tablets (650 mg total) by mouth every 6 (six) hours as needed for mild pain (pain score 1-3) or fever (or Fever >/= 101). 11/20/23   Dorinda Drue DASEN, MD  aspirin  (ASPIRIN  CHILDRENS) 81 MG chewable tablet Chew 1 tablet (81 mg total) by mouth 2 (two) times daily with a meal. 11/13/23 12/28/23  Hill, Valery RAMAN, PA-C  atorvastatin  (LIPITOR) 10 MG tablet TAKE 1 TAB ON MONDAYS AND FRIDAY 02/26/23   [name] PARAS, MD  Biotin  5000 MCG TABS Take 10,000 mcg by mouth in the morning.    [provider]  bisacodyl  (DULCOLAX) 10 MG suppository Place 1 suppository (10 mg total) rectally daily as needed for moderate constipation. 11/20/23   Dorinda Drue DASEN, MD  Carboxymethylcellul-Glycerin  (LUBRICATING EYE DROPS OP) Place 1 drop into both eyes daily as needed (dry eyes).    [provider]  carvedilol  (COREG ) 3.125 MG tablet Take 1 tablet (3.125 mg total) by mouth 2 (two) times daily with a meal. Patient taking differently: Take 0.5 tablets by mouth 2 (two)  times daily with a meal. 11/10/23   Burns, Glade PARAS, MD  chlorhexidine  (HIBICLENS ) 4 % external liquid Apply 15 mLs (1 Application total) topically as directed for 30 doses. Use as directed daily for 5 days every other week for 6 weeks. 11/12/23   Hill, Valery RAMAN, PA-C  cholecalciferol  (VITAMIN D ) 25 MCG (1000 UNIT) tablet Take 1,000 Units by mouth in the morning.    [provider]  cyclobenzaprine  (FLEXERIL ) 10 MG tablet TAKE 1 TABLET BY MOUTH THREE TIMES A DAY AS NEEDED FOR MUSCLE SPASM 06/11/23   [name] PARAS, MD  denosumab  (PROLIA ) 60 MG/ML SOSY injection Inject 60 mg into the skin every 6 (six) months. Deliver to rheum: [address], [address], [place], [place] [address]. Appt on 07/23/2022 07/17/22 11/10/23  Cheryl Waddell HERO, PA-C  diazepam  (VALIUM ) 5 MG tablet TAKE 1 TABLET BY MOUTH AT BEDTIME AS NEEDED FOR MUSCLE SPASMS. 04/28/23   [name] PARAS, MD  diclofenac sodium (VOLTAREN) 1 % GEL APPLY 3 GRAMS TO 3 LARGE JOINTS 3 TIMES A DAY AS NEEDED 01/27/18   Dolphus Reiter, MD  docusate sodium  (COLACE) 100 MG capsule Take 1 capsule (100 mg total) by mouth daily. 11/21/23   Dorinda Drue DASEN, MD  DULoxetine  (CYMBALTA ) 20 MG capsule Take 40 mg by mouth at bedtime.    [provider]  folic acid  (FOLVITE ) 1 MG tablet Take 1 tablet (  1 mg total) by mouth daily. 09/02/23   Cheryl Waddell HERO, PA-C  furosemide  (LASIX ) 20 MG tablet TAKE 1 TABLET BY MOUTH EVERY DAY 08/19/23   [name] PARAS, MD  methotrexate  (RHEUMATREX) 2.5 MG tablet Take 5 tablets (12.5 mg total) by mouth once a week. Caution:Chemotherapy. Protect from light. 09/02/23   Cheryl Waddell HERO, PA-C  mupirocin  ointment (BACTROBAN ) 2 % Place 1 Application into the nose 2 (two) times daily for 60 doses. Use as directed 2 times daily for 5 days every other week for 6 weeks. 11/12/23 12/12/23  Leigh Valery RAMAN, PA-C  oxybutynin  (DITROPAN ) 5 MG tablet Take 5 mg by mouth daily. 11/06/23   [provider]  pantoprazole  (PROTONIX ) 40 MG tablet Take 1 tablet  (40 mg total) by mouth 2 (two) times daily before a meal. 07/27/23   Burns, Glade PARAS, MD  polyethylene glycol (MIRALAX  / GLYCOLAX ) 17 g packet Take 17 g by mouth daily as needed for mild constipation. 11/20/23   Dorinda Drue DASEN, MD  predniSONE  (DELTASONE ) 5 MG tablet TAKE 1 TABLET BY MOUTH EVERY DAY IN THE MORNING 05/22/23   Cheryl Waddell HERO, PA-C  Upadacitinib  ER ([name] ) 15 MG TB24 Take 15 mg by mouth daily.    [provider]    Allergies: Darvon [propoxyphene hcl], Demerol [meperidine], Penicillins, Imuran [azathioprine], Sulfa  antibiotics, Arava [leflunomide], Lipitor [atorvastatin ], and Vytorin [ezetimibe-simvastatin]    Review of Systems  Musculoskeletal:  Positive for arthralgias.  All other systems reviewed and are negative.   Updated Vital Signs Ht 5' 1 (1.549 m)   Wt 48.6 kg   BMI 20.24 kg/m   Physical Exam Vitals and nursing note reviewed.  Constitutional:      General: She is not in acute distress.    Appearance: Normal appearance.  HENT:     Head: Normocephalic and atraumatic.     Mouth/Throat:     Mouth: Mucous membranes are moist.     Pharynx: Oropharynx is clear.  Eyes:     Extraocular Movements: Extraocular movements intact.     Conjunctiva/sclera: Conjunctivae normal.     Pupils: Pupils are equal, round, and reactive to light.  Cardiovascular:     Rate and Rhythm: Normal rate and regular rhythm.     Pulses: Normal pulses.     Heart sounds: Normal heart sounds. No murmur heard.    No friction rub. No gallop.  Pulmonary:     Effort: Pulmonary effort is normal.     Breath sounds: Normal breath sounds.  Abdominal:     General: Abdomen is flat. Bowel sounds are normal.     Palpations: Abdomen is soft.  Musculoskeletal:        General: Normal range of motion.     Left elbow: Normal.     Cervical back: Normal range of motion and neck supple.     Comments: Right elbow has a healing surgical wound that has a dehiscence at the proximal aspect of the wound  at the olecranon with exposed surgical hardware.  Binder in place on the right side for previous hip surgery as noted.  Skin:    General: Skin is warm and dry.     Capillary Refill: Capillary refill takes less than 2 seconds.  Neurological:     General: No focal deficit present.     Mental Status: She is alert. Mental status is at baseline.  Psychiatric:        Mood and Affect: Mood normal.        (  all labs ordered are listed, but only abnormal results are displayed) Labs Reviewed - No data to display  EKG: None  Radiology: No results found.   Procedures   Medications Ordered in the ED - No data to display                                  Medical Decision Making Risk Prescription drug management.   Medical Decision Making:   Javier  A Mirelez is a 83 y.o. female who presented to the ED today with surgical wound dehiscence detailed above.    Handoff received from EMS.  External chart has been reviewed including previous imaging and surgical notes. Complete initial physical exam performed, notably the patient  was alert and oriented in no apparent distress.  Physical exam as noted with noted imaging attached..    Reviewed and confirmed nursing documentation for past medical history, family history, social history.    Initial Assessment:   With the patient's presentation of surgical wound dehiscence, most likely diagnosis is dehiscence of surgical wound of the right elbow.   Initial Plan:  Consultation to orthopedic surgery for further management. Recent extensive workup on 6/27, will defer basic labs at this time. As imaging was obtained on 6/27 we will defer further imaging at this time. Objective evaluation as below reviewed   Initial Study Results:    Radiology:  All images reviewed independently. Agree with radiology report at this time.   DG Elbow 2 Views Right Result Date: 11/20/2023 CLINICAL DATA:  Fracture, postop. EXAM: RIGHT ELBOW - 2 VIEW  COMPARISON:  Preoperative radiograph FINDINGS: Screw traverses the olecranon fracture. There is improved fracture alignment from preoperative imaging. Persistent displacement of 6-7 mm at the articular surface. Overlying soft tissue edema and air related to recent surgery. IMPRESSION: ORIF of olecranon fracture. Electronically Signed   By: Andrea Gasman M.D.   On: 11/20/2023 11:14   DG ELBOW COMPLETE RIGHT (3+VIEW) Result Date: 11/20/2023 CLINICAL DATA:  Elective surgery EXAM: RIGHT ELBOW - COMPLETE 3+ VIEW COMPARISON:  Radiograph 11/12/2023 FINDINGS: Four fluoroscopic spot views of the elbow submitted from the operating room. Screw traverses the proximal ulnar fracture with improved fracture alignment. Fluoroscopy time 23 seconds. Dose 0.29 mGy. IMPRESSION: Intraoperative fluoroscopy during proximal ulnar fracture fixation. Electronically Signed   By: Andrea Gasman M.D.   On: 11/20/2023 11:13   DG C-Arm 1-60 Min-No Report Result Date: 11/20/2023 Fluoroscopy was utilized by the requesting physician.  No radiographic interpretation.   DG Pelvis Portable Result Date: 11/16/2023 EXAM: 2 VIEW(S) XRAY OF THE PELVIS 11/16/2023 07:31:00 AM COMPARISON: None available. 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 FINDINGS: BONES AND JOINTS: The image at 07:24 am shows a persistent superior dislocation of the right total hip arthroplasty. Image at 7:27 demonstrates the hip is located. Skin staples remain. Visualized left total hip arthroplasty is within normal limits. Lumbar fusion is noted. SOFT TISSUES: The soft tissues are unremarkable. IMPRESSION: 1. 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 2. Visualized left total hip arthroplasty is within normal limits. Electronically signed by: Lonni Necessary MD 11/16/2023 07:49 AM EDT RP Workstation: [id number]   DG Pelvis Portable Result Date: 11/15/2023 CLINICAL  DATA:  Right hip pain status post right hip surgery 3 days ago. EXAM: PORTABLE PELVIS 1-2 VIEWS COMPARISON:  11/12/2023 FINDINGS: Bilateral hip replacements. There is superior dislocation of the right  hip replacement. The acetabular cup appears more vertically oriented than on prior study. No fracture. IMPRESSION: Bilateral replacements. Superior dislocation of the right hip replacement. The acetabular component appears more vertically oriented. Electronically Signed   By: Franky Crease M.D.   On: 11/15/2023 18:35   DG Pelvis Portable Result Date: 11/12/2023 CLINICAL DATA:  Status post total right hip arthroplasty. EXAM: PORTABLE PELVIS 1-2 VIEWS COMPARISON:  AP pelvis 11/10/2023, right hip radiographs 11/10/2023 FINDINGS: Interval total right hip arthroplasty. Redemonstration of total left hip arthroplasty. The distal femoral stems are not imaged. No perihardware lucency is seen to indicate hardware failure or loosening. Lateral right hip and thigh subcutaneous air expected postoperative change. Lateral right thigh surgical skin staples. Mild superior pubic symphysis joint space narrowing. Lumbar spine and superior sacral posterior fusion hardware again noted. IMPRESSION: Interval total right hip arthroplasty without evidence of hardware failure. Electronically Signed   By: Tanda Lyons M.D.   On: 11/12/2023 19:09   DG Elbow 2 Views Right Result Date: 11/12/2023 CLINICAL DATA:  Right elbow fracture. EXAM: RIGHT ELBOW - 2 VIEW COMPARISON:  Right elbow radiographs 10/08/2023 FINDINGS: There is diffuse decreased bone mineralization. There is increased diastasis of the previously seen transverse fracture of the olecranon. Posteriorly, this diastasis measures up to 25 cm, previously 1.6 cm and anteriorly 1.0 cm, previously 0.9 cm. No healing bridging sclerosis or peripheral callus formation. Moderate radiocapitellar joint space narrowing. Elevation of the distal humeral fat pad suggesting an elbow joint effusion.  IMPRESSION: Increased diastasis of the previously seen transverse fracture of the olecranon. No healing bridging sclerosis at this time. Electronically Signed   By: Tanda Lyons M.D.   On: 11/12/2023 19:08   DG HIP UNILAT WITH PELVIS 1V RIGHT Result Date: 11/12/2023 CLINICAL DATA:  Total hip arthroplasty anterior approach. EXAM: DG HIP (WITH OR WITHOUT PELVIS) 1V RIGHT COMPARISON:  Right hip radiographs 11/10/2023 FINDINGS: Images were performed intraoperatively without the presence of a radiologist. The patient is undergoing total right hip arthroplasty. Partial visualization of prior total left hip arthroplasty. No hardware complication is seen. Total fluoroscopy images: 4 Total fluoroscopy time: 13 seconds Total dose: Radiation Exposure Index (as provided by the fluoroscopic device): 1.39 mGy air Kerma Please see intraoperative findings for further detail. IMPRESSION: Intraoperative fluoroscopy for total right hip arthroplasty. Electronically Signed   By: Tanda Lyons M.D.   On: 11/12/2023 19:06   DG C-Arm 1-60 Min-No Report Result Date: 11/12/2023 Fluoroscopy was utilized by the requesting physician.  No radiographic interpretation.   DG C-Arm 1-60 Min-No Report Result Date: 11/12/2023 Fluoroscopy was utilized by the requesting physician.  No radiographic interpretation.   DG C-Arm 1-60 Min-No Report Result Date: 11/12/2023 Fluoroscopy was utilized by the requesting physician.  No radiographic interpretation.   DG Pelvis 1-2 Views Result Date: 11/10/2023 CLINICAL DATA:  Right hip pain since fall 4 weeks ago. EXAM: PELVIS - 1-2 VIEW COMPARISON:  Hip series today FINDINGS: Postoperative changes from posterior fusion in the lower lumbar spine. Prior left hip replacement. No hardware complicating feature. Foreshortening of the right femoral neck compatible with impacted femoral neck fracture. No subluxation or dislocation. IMPRESSION: Impacted right femoral neck fracture. Electronically Signed   By:  Franky Crease M.D.   On: 11/10/2023 16:39   DG HIP UNILAT WITH PELVIS 2-3 VIEWS RIGHT Result Date: 11/10/2023 CLINICAL DATA:  Fall 4 weeks ago, right hip pain EXAM: DG HIP (WITH OR WITHOUT PELVIS) 2-3V RIGHT COMPARISON:  07/12/2021 FINDINGS: There is foreshortening of the  femoral neck most compatible with impacted femoral neck fracture. No subluxation or dislocation. IMPRESSION: Impacted right femoral neck fracture. Electronically Signed   By: Franky Crease M.D.   On: 11/10/2023 16:39      Consults: Case discussed with on-call sports med orthopedic surgeon who will be coming to ED to evaluate patient.   Reassessment and Plan:   As this patient has had recent extensive workup, at this time we will not obtain any labs or imaging.  Orthopedic consult has been placed and they are in down to evaluate the patient for likely surgical admission.  Nighttime meds order placed.  At time of handover, awaiting ortho to assess patient in the ED.  Anticipate admission for surgery.  Care handed to R. Vicky, [place]       Final diagnoses:  None    ED Discharge Orders     None          Myriam Dorn [place], [name] 11/26/23 [id number]    Gennaro Bouchard L, DO 12/01/23 1035

## 2023-11-27 ENCOUNTER — Emergency Department (HOSPITAL_COMMUNITY)

## 2023-11-27 ENCOUNTER — Encounter (HOSPITAL_COMMUNITY): Payer: Self-pay | Admitting: Internal Medicine

## 2023-11-27 DIAGNOSIS — T84228A Displacement of internal fixation device of other bones, initial encounter: Secondary | ICD-10-CM | POA: Diagnosis present

## 2023-11-27 DIAGNOSIS — E785 Hyperlipidemia, unspecified: Secondary | ICD-10-CM | POA: Diagnosis not present

## 2023-11-27 DIAGNOSIS — Z800 Family history of malignant neoplasm of digestive organs: Secondary | ICD-10-CM | POA: Diagnosis not present

## 2023-11-27 DIAGNOSIS — E871 Hypo-osmolality and hyponatremia: Secondary | ICD-10-CM | POA: Diagnosis not present

## 2023-11-27 DIAGNOSIS — M0579 Rheumatoid arthritis with rheumatoid factor of multiple sites without organ or systems involvement: Secondary | ICD-10-CM | POA: Diagnosis not present

## 2023-11-27 DIAGNOSIS — L03115 Cellulitis of right lower limb: Secondary | ICD-10-CM | POA: Diagnosis not present

## 2023-11-27 DIAGNOSIS — L97119 Non-pressure chronic ulcer of right thigh with unspecified severity: Secondary | ICD-10-CM | POA: Diagnosis not present

## 2023-11-27 DIAGNOSIS — N390 Urinary tract infection, site not specified: Secondary | ICD-10-CM | POA: Diagnosis not present

## 2023-11-27 DIAGNOSIS — Z79631 Long term (current) use of antimetabolite agent: Secondary | ICD-10-CM | POA: Diagnosis not present

## 2023-11-27 DIAGNOSIS — D539 Nutritional anemia, unspecified: Secondary | ICD-10-CM | POA: Insufficient documentation

## 2023-11-27 DIAGNOSIS — E876 Hypokalemia: Secondary | ICD-10-CM | POA: Diagnosis not present

## 2023-11-27 DIAGNOSIS — L0390 Cellulitis, unspecified: Secondary | ICD-10-CM | POA: Diagnosis not present

## 2023-11-27 DIAGNOSIS — Z472 Encounter for removal of internal fixation device: Secondary | ICD-10-CM | POA: Diagnosis not present

## 2023-11-27 DIAGNOSIS — S52021K Displaced fracture of olecranon process without intraarticular extension of right ulna, subsequent encounter for closed fracture with nonunion: Secondary | ICD-10-CM | POA: Diagnosis not present

## 2023-11-27 DIAGNOSIS — T8130XA Disruption of wound, unspecified, initial encounter: Secondary | ICD-10-CM | POA: Diagnosis present

## 2023-11-27 DIAGNOSIS — Z79899 Other long term (current) drug therapy: Secondary | ICD-10-CM | POA: Diagnosis not present

## 2023-11-27 DIAGNOSIS — Z87891 Personal history of nicotine dependence: Secondary | ICD-10-CM | POA: Diagnosis not present

## 2023-11-27 DIAGNOSIS — R5381 Other malaise: Secondary | ICD-10-CM | POA: Diagnosis not present

## 2023-11-27 DIAGNOSIS — G35 Multiple sclerosis: Secondary | ICD-10-CM | POA: Diagnosis not present

## 2023-11-27 DIAGNOSIS — E861 Hypovolemia: Secondary | ICD-10-CM | POA: Diagnosis not present

## 2023-11-27 DIAGNOSIS — I951 Orthostatic hypotension: Secondary | ICD-10-CM | POA: Diagnosis not present

## 2023-11-27 DIAGNOSIS — Z8249 Family history of ischemic heart disease and other diseases of the circulatory system: Secondary | ICD-10-CM | POA: Diagnosis not present

## 2023-11-27 DIAGNOSIS — K219 Gastro-esophageal reflux disease without esophagitis: Secondary | ICD-10-CM | POA: Diagnosis not present

## 2023-11-27 DIAGNOSIS — Y838 Other surgical procedures as the cause of abnormal reaction of the patient, or of later complication, without mention of misadventure at the time of the procedure: Secondary | ICD-10-CM | POA: Diagnosis present

## 2023-11-27 DIAGNOSIS — T84020A Dislocation of internal right hip prosthesis, initial encounter: Secondary | ICD-10-CM | POA: Diagnosis not present

## 2023-11-27 DIAGNOSIS — R1310 Dysphagia, unspecified: Secondary | ICD-10-CM | POA: Diagnosis not present

## 2023-11-27 DIAGNOSIS — D509 Iron deficiency anemia, unspecified: Secondary | ICD-10-CM | POA: Diagnosis not present

## 2023-11-27 DIAGNOSIS — Z96641 Presence of right artificial hip joint: Secondary | ICD-10-CM | POA: Diagnosis not present

## 2023-11-27 DIAGNOSIS — Z471 Aftercare following joint replacement surgery: Secondary | ICD-10-CM | POA: Diagnosis not present

## 2023-11-27 DIAGNOSIS — R112 Nausea with vomiting, unspecified: Secondary | ICD-10-CM | POA: Diagnosis not present

## 2023-11-27 DIAGNOSIS — S42401G Unspecified fracture of lower end of right humerus, subsequent encounter for fracture with delayed healing: Secondary | ICD-10-CM | POA: Diagnosis not present

## 2023-11-27 DIAGNOSIS — Z96643 Presence of artificial hip joint, bilateral: Secondary | ICD-10-CM | POA: Diagnosis not present

## 2023-11-27 DIAGNOSIS — Z515 Encounter for palliative care: Secondary | ICD-10-CM | POA: Diagnosis not present

## 2023-11-27 DIAGNOSIS — K5901 Slow transit constipation: Secondary | ICD-10-CM | POA: Diagnosis not present

## 2023-11-27 DIAGNOSIS — Z85828 Personal history of other malignant neoplasm of skin: Secondary | ICD-10-CM | POA: Diagnosis not present

## 2023-11-27 DIAGNOSIS — K5900 Constipation, unspecified: Secondary | ICD-10-CM | POA: Diagnosis not present

## 2023-11-27 DIAGNOSIS — S46312A Strain of muscle, fascia and tendon of triceps, left arm, initial encounter: Secondary | ICD-10-CM | POA: Diagnosis not present

## 2023-11-27 DIAGNOSIS — F0390 Unspecified dementia without behavioral disturbance: Secondary | ICD-10-CM | POA: Diagnosis present

## 2023-11-27 DIAGNOSIS — L03113 Cellulitis of right upper limb: Secondary | ICD-10-CM | POA: Diagnosis present

## 2023-11-27 DIAGNOSIS — R339 Retention of urine, unspecified: Secondary | ICD-10-CM | POA: Diagnosis not present

## 2023-11-27 DIAGNOSIS — E222 Syndrome of inappropriate secretion of antidiuretic hormone: Secondary | ICD-10-CM | POA: Diagnosis not present

## 2023-11-27 DIAGNOSIS — M069 Rheumatoid arthritis, unspecified: Secondary | ICD-10-CM | POA: Diagnosis not present

## 2023-11-27 DIAGNOSIS — Z833 Family history of diabetes mellitus: Secondary | ICD-10-CM | POA: Diagnosis not present

## 2023-11-27 DIAGNOSIS — D62 Acute posthemorrhagic anemia: Secondary | ICD-10-CM | POA: Diagnosis not present

## 2023-11-27 DIAGNOSIS — M25351 Other instability, right hip: Secondary | ICD-10-CM | POA: Diagnosis not present

## 2023-11-27 DIAGNOSIS — Z7189 Other specified counseling: Secondary | ICD-10-CM | POA: Diagnosis not present

## 2023-11-27 DIAGNOSIS — Y792 Prosthetic and other implants, materials and accessory orthopedic devices associated with adverse incidents: Secondary | ICD-10-CM | POA: Diagnosis present

## 2023-11-27 DIAGNOSIS — S52021A Displaced fracture of olecranon process without intraarticular extension of right ulna, initial encounter for closed fracture: Secondary | ICD-10-CM | POA: Diagnosis not present

## 2023-11-27 DIAGNOSIS — W19XXXD Unspecified fall, subsequent encounter: Secondary | ICD-10-CM | POA: Diagnosis present

## 2023-11-27 DIAGNOSIS — L89151 Pressure ulcer of sacral region, stage 1: Secondary | ICD-10-CM | POA: Diagnosis not present

## 2023-11-27 DIAGNOSIS — R58 Hemorrhage, not elsewhere classified: Secondary | ICD-10-CM | POA: Diagnosis not present

## 2023-11-27 DIAGNOSIS — I10 Essential (primary) hypertension: Secondary | ICD-10-CM | POA: Diagnosis not present

## 2023-11-27 DIAGNOSIS — G8929 Other chronic pain: Secondary | ICD-10-CM | POA: Diagnosis not present

## 2023-11-27 DIAGNOSIS — T8133XA Disruption of traumatic injury wound repair, initial encounter: Secondary | ICD-10-CM | POA: Diagnosis not present

## 2023-11-27 DIAGNOSIS — Z96642 Presence of left artificial hip joint: Secondary | ICD-10-CM | POA: Diagnosis not present

## 2023-11-27 LAB — CBC
HCT: 25.5 % — ABNORMAL LOW (ref 36.0–46.0)
Hemoglobin: 8.1 g/dL — ABNORMAL LOW (ref 12.0–15.0)
MCH: 34 pg (ref 26.0–34.0)
MCHC: 31.8 g/dL (ref 30.0–36.0)
MCV: 107.1 fL — ABNORMAL HIGH (ref 80.0–100.0)
Platelets: 351 K/uL (ref 150–400)
RBC: 2.38 MIL/uL — ABNORMAL LOW (ref 3.87–5.11)
RDW: 17.7 % — ABNORMAL HIGH (ref 11.5–15.5)
WBC: 11.8 K/uL — ABNORMAL HIGH (ref 4.0–10.5)
nRBC: 0.3 % — ABNORMAL HIGH (ref 0.0–0.2)

## 2023-11-27 LAB — CBC WITH DIFFERENTIAL/PLATELET
Abs Immature Granulocytes: 0.38 K/uL — ABNORMAL HIGH (ref 0.00–0.07)
Basophils Absolute: 0 K/uL (ref 0.0–0.1)
Basophils Relative: 0 %
Eosinophils Absolute: 0.1 K/uL (ref 0.0–0.5)
Eosinophils Relative: 1 %
HCT: 24.7 % — ABNORMAL LOW (ref 36.0–46.0)
Hemoglobin: 8.1 g/dL — ABNORMAL LOW (ref 12.0–15.0)
Immature Granulocytes: 4 %
Lymphocytes Relative: 9 %
Lymphs Abs: 0.9 K/uL (ref 0.7–4.0)
MCH: 33.5 pg (ref 26.0–34.0)
MCHC: 32.8 g/dL (ref 30.0–36.0)
MCV: 102.1 fL — ABNORMAL HIGH (ref 80.0–100.0)
Monocytes Absolute: 0.5 K/uL (ref 0.1–1.0)
Monocytes Relative: 5 %
Neutro Abs: 7.7 K/uL (ref 1.7–7.7)
Neutrophils Relative %: 81 %
Platelets: 354 K/uL (ref 150–400)
RBC: 2.42 MIL/uL — ABNORMAL LOW (ref 3.87–5.11)
RDW: 17.2 % — ABNORMAL HIGH (ref 11.5–15.5)
WBC: 9.5 K/uL (ref 4.0–10.5)
nRBC: 0.3 % — ABNORMAL HIGH (ref 0.0–0.2)

## 2023-11-27 LAB — BASIC METABOLIC PANEL WITH GFR
Anion gap: 11 (ref 5–15)
Anion gap: 7 (ref 5–15)
BUN: 10 mg/dL (ref 8–23)
BUN: 9 mg/dL (ref 8–23)
CO2: 24 mmol/L (ref 22–32)
CO2: 29 mmol/L (ref 22–32)
Calcium: 7.6 mg/dL — ABNORMAL LOW (ref 8.9–10.3)
Calcium: 7.8 mg/dL — ABNORMAL LOW (ref 8.9–10.3)
Chloride: 94 mmol/L — ABNORMAL LOW (ref 98–111)
Chloride: 95 mmol/L — ABNORMAL LOW (ref 98–111)
Creatinine, Ser: 0.6 mg/dL (ref 0.44–1.00)
Creatinine, Ser: 0.68 mg/dL (ref 0.44–1.00)
GFR, Estimated: >60 mL/min (ref >60)
GFR, Estimated: >60 mL/min (ref >60)
Glucose, Bld: 103 mg/dL — ABNORMAL HIGH (ref 70–99)
Glucose, Bld: 80 mg/dL (ref 70–99)
Potassium: 3.3 mmol/L — ABNORMAL LOW (ref 3.5–5.1)
Potassium: 3.6 mmol/L (ref 3.5–5.1)
Sodium: 129 mmol/L — ABNORMAL LOW (ref 135–145)
Sodium: 131 mmol/L — ABNORMAL LOW (ref 135–145)

## 2023-11-27 LAB — IRON AND TIBC
Iron: 38 ug/dL (ref 28–170)
Saturation Ratios: 19 % (ref 10.4–31.8)
TIBC: 199 ug/dL — ABNORMAL LOW (ref 250–450)
UIBC: 161 ug/dL

## 2023-11-27 LAB — RETICULOCYTES
Immature Retic Fract: 32.5 % — ABNORMAL HIGH (ref 2.3–15.9)
RBC.: 2.28 MIL/uL — ABNORMAL LOW (ref 3.87–5.11)
Retic Count, Absolute: 133.4 K/uL (ref 19.0–186.0)
Retic Ct Pct: 5.9 % — ABNORMAL HIGH (ref 0.4–3.1)

## 2023-11-27 LAB — FOLATE: Folate: 17.9 ng/mL (ref >5.9)

## 2023-11-27 LAB — VITAMIN B12: Vitamin B-12: 497 pg/mL (ref 180–914)

## 2023-11-27 LAB — FERRITIN: Ferritin: 267 ng/mL (ref 11–307)

## 2023-11-27 LAB — TSH: TSH: 3.196 u[IU]/mL (ref 0.350–4.500)

## 2023-11-27 MED ORDER — FENTANYL CITRATE PF 50 MCG/ML IJ SOSY
25.0000 ug | PREFILLED_SYRINGE | Freq: Once | INTRAMUSCULAR | Status: AC
  Administered 2023-11-27: 25 ug via INTRAVENOUS
  Filled 2023-11-27: qty 1

## 2023-11-27 MED ORDER — FOLIC ACID 1 MG PO TABS
1.0000 mg | ORAL_TABLET | Freq: Every day | ORAL | Status: DC
  Administered 2023-11-28 – 2023-12-08 (×11): 1 mg via ORAL
  Filled 2023-11-27 (×11): qty 1

## 2023-11-27 MED ORDER — CEFAZOLIN SODIUM-DEXTROSE 2-4 GM/100ML-% IV SOLN: 2.0000 g | INTRAVENOUS | Status: DC

## 2023-11-27 MED ORDER — DULOXETINE HCL 20 MG PO CPEP
20.0000 mg | ORAL_CAPSULE | Freq: Every day | ORAL | Status: DC
  Administered 2023-11-28 – 2023-12-07 (×10): 20 mg via ORAL
  Filled 2023-11-27 (×10): qty 1

## 2023-11-27 MED ORDER — MORPHINE SULFATE (PF) 2 MG/ML IV SOLN
2.0000 mg | INTRAVENOUS | Status: DC | PRN
  Administered 2023-11-30 – 2023-12-07 (×14): 2 mg via INTRAVENOUS
  Filled 2023-11-27 (×14): qty 1

## 2023-11-27 MED ORDER — SODIUM CHLORIDE 0.9 % IV SOLN: 1.0000 g | INTRAVENOUS | Status: DC

## 2023-11-27 MED ORDER — PROPOFOL 10 MG/ML IV BOLUS
INTRAVENOUS | Status: AC
  Filled 2023-11-27: qty 20

## 2023-11-27 MED ORDER — CEFAZOLIN SODIUM-DEXTROSE 2-4 GM/100ML-% IV SOLN
2.0000 g | Freq: Once | INTRAVENOUS | Status: DC
  Filled 2023-11-27: qty 100

## 2023-11-27 MED ORDER — DOCUSATE SODIUM 100 MG PO CAPS
100.0000 mg | ORAL_CAPSULE | Freq: Every day | ORAL | Status: DC
  Administered 2023-11-28 – 2023-12-08 (×11): 100 mg via ORAL
  Filled 2023-11-27 (×11): qty 1

## 2023-11-27 MED ORDER — CYCLOBENZAPRINE HCL 10 MG PO TABS
10.0000 mg | ORAL_TABLET | Freq: Every day | ORAL | Status: DC
  Administered 2023-11-27 – 2023-12-07 (×11): 10 mg via ORAL
  Filled 2023-11-27 (×12): qty 1

## 2023-11-27 MED ORDER — ACETAMINOPHEN 500 MG PO TABS
1000.0000 mg | ORAL_TABLET | Freq: Once | ORAL | Status: AC
  Administered 2023-11-28: 1000 mg via ORAL

## 2023-11-27 MED ORDER — CARVEDILOL 3.125 MG PO TABS
3.1250 mg | ORAL_TABLET | Freq: Two times a day (BID) | ORAL | Status: DC
  Administered 2023-11-27 – 2023-12-08 (×19): 3.125 mg via ORAL
  Filled 2023-11-27 (×20): qty 1

## 2023-11-27 MED ORDER — POVIDONE-IODINE 10 % EX SWAB
2.0000 | Freq: Once | CUTANEOUS | Status: AC
  Administered 2023-11-28: 2 via TOPICAL

## 2023-11-27 MED ORDER — SODIUM CHLORIDE 0.9 % IV SOLN
1.0000 g | INTRAVENOUS | Status: DC
  Administered 2023-11-27 – 2023-11-30 (×4): 1 g via INTRAVENOUS
  Filled 2023-11-27 (×4): qty 10

## 2023-11-27 MED ORDER — SODIUM CHLORIDE 0.9 % IV SOLN: INTRAVENOUS | Status: DC

## 2023-11-27 MED ORDER — SENNA 8.6 MG PO TABS
1.0000 | ORAL_TABLET | Freq: Two times a day (BID) | ORAL | Status: DC
  Administered 2023-11-27 – 2023-12-08 (×21): 8.6 mg via ORAL
  Filled 2023-11-27 (×22): qty 1

## 2023-11-27 MED ORDER — POLYETHYLENE GLYCOL 3350 17 G PO PACK
17.0000 g | PACK | Freq: Every day | ORAL | Status: DC | PRN
  Administered 2023-12-06: 17 g via ORAL
  Filled 2023-11-27 (×2): qty 1

## 2023-11-27 MED ORDER — VANCOMYCIN HCL IN DEXTROSE 1-5 GM/200ML-% IV SOLN: 1000.0000 mg | INTRAVENOUS | Status: DC

## 2023-11-27 MED ORDER — ATORVASTATIN CALCIUM 10 MG PO TABS
10.0000 mg | ORAL_TABLET | Freq: Every day | ORAL | Status: DC
  Administered 2023-11-28 – 2023-12-08 (×11): 10 mg via ORAL
  Filled 2023-11-27 (×11): qty 1

## 2023-11-27 MED ORDER — BISACODYL 10 MG RE SUPP: 10.0000 mg | Freq: Every day | RECTAL | Status: DC | PRN

## 2023-11-27 MED ORDER — PREDNISONE 5 MG PO TABS
5.0000 mg | ORAL_TABLET | Freq: Every day | ORAL | Status: DC
  Administered 2023-11-27 – 2023-12-08 (×12): 5 mg via ORAL
  Filled 2023-11-27 (×12): qty 1

## 2023-11-27 MED ORDER — HYDROCODONE-ACETAMINOPHEN 5-325 MG PO TABS
1.0000 | ORAL_TABLET | ORAL | Status: DC | PRN
  Administered 2023-11-27 – 2023-12-08 (×37): 2 via ORAL
  Filled 2023-11-27 (×38): qty 2

## 2023-11-27 MED ORDER — PANTOPRAZOLE SODIUM 40 MG PO TBEC
40.0000 mg | DELAYED_RELEASE_TABLET | Freq: Two times a day (BID) | ORAL | Status: DC
  Administered 2023-11-27 – 2023-12-08 (×21): 40 mg via ORAL
  Filled 2023-11-27 (×21): qty 1

## 2023-11-27 MED ORDER — CHLORHEXIDINE GLUCONATE CLOTH 2 % EX PADS
6.0000 | MEDICATED_PAD | Freq: Every day | CUTANEOUS | Status: DC
  Administered 2023-11-27 – 2023-12-08 (×12): 6 via TOPICAL

## 2023-11-27 MED ORDER — CEPHALEXIN 500 MG PO CAPS
500.0000 mg | ORAL_CAPSULE | Freq: Three times a day (TID) | ORAL | Status: DC
  Filled 2023-11-27: qty 1

## 2023-11-27 NOTE — H&P (Signed)
 History and Physical    Sherri [name]:[id number] DOB: Mar 31, 1941 DOA: 11/26/2023  Patient coming from: Home.  Chief Complaint: Right elbow visible hardware  HPI: Sherri [name] is a 83 y.o. female with history of hypertension, rheumatoid arthritis, chronic pain, urinary retention, recently admitted for right hip fracture during which patient also had surgery for right elbow for fracture was noticed by home health aide to have visible hardware on wound changes.  Patient was referred to the ER.  Patient did not have any fever or chills.  Patient was admitted for sepsis secondary to cellulitis of the right lower extremity in month of May 2025.  ED Course: In the ER patient was evaluated by orthopedic surgery.  X-ray shows fracture of the right elbow with malposition screw.  Orthopedic surgery planning surgery in the morning.  Labs show hyponatremia 129 WBC 11.8 hemoglobin 8.1.  Review of Systems: As per HPI, rest all negative.   Past Medical History:  Diagnosis Date   Anxiety    takes Valium  daily as needed   Basal cell carcinoma 01/21/1988   left nostril (MOHS), sup-right calf (CX35FU)   Basal cell carcinoma 03/22/1996   right calf (CX35FU)   Basal cell carcinoma 03/31/1995   left wing nose   Bruises easily    d/t meds   Cancer (HCC)    basal cell ca, in situ- uterine    Cataracts, bilateral    removed bilateral   Chronic back pain    Dizziness    r/t to meds   GERD (gastroesophageal reflux disease)    no meds on a regular basis but will take Tums if needed   Headache(784.0)    r/t neck issues   History of bronchitis 6-15yrs ago   History of colon polyps    benign   HOH (hard of hearing)    wears hearing aids   Hyperlipidemia    takes Atorvastatin  on Mondays and Fridays   Hypertension    Joint pain    Joint swelling    Multiple sclerosis (HCC)    Neuromuscular disorder (HCC)    Dr. FABIENE Crete- [facility], follows [facility]   Nocturia    PONV (postoperative  nausea and vomiting)    trouble urinating after surgery in 2014   Postoperative nausea and vomiting 01/11/2019   Rheumatoid arthritis (HCC)    Dr Lorrayne weekly, RA- hands- knees- feet    Rheumatoid arthritis(714.0)    Dr Dolphus takes Xeljanz  daily   Right wrist fracture    Skin cancer    Squamous cell carcinoma of skin 11/02/2001   in situ-right knee (cx64fu)   Squamous cell carcinoma of skin 11/12/2011   in situ-left forearm (CX35FU), in situ-left foot (CX35FU)   Squamous cell carcinoma of skin 01/22/2012   in situ-left lower forearm (txpbx)   Squamous cell carcinoma of skin 11/17/2012   right shin (txpbx)   Squamous cell carcinoma of skin 10/28/2013   in situ-right shoulder (CX35FU)   Squamous cell carcinoma of skin 04/28/2014   well diff-right shin (txpbx)   Squamous cell carcinoma of skin 11/02/2014   in situ-Left shin (txpbx)   Squamous cell carcinoma of skin 12/15/2014   in situ-Left hand (txpbx), bowens-left side chest (txpbx)   Squamous cell carcinoma of skin 05/09/2015   in situ-left hand (txpbx)    Squamous cell carcinoma of skin 05/07/2016   in situ-left inner shin,ant, in situ-left inner shin, post, in situ-left outer forearm, in situ-right knuckle   Squamous  cell carcinoma of skin 03/1302018   in situ-left shoulder (txpbx), in situ-right inner shin (txpbx), in situ-top of left foot (txpbx), KA- left forearm (txpbx)   Squamous cell carcinoma of skin 12/02/2016   in situ-left inner shin (txpbx)   Squamous cell carcinoma of skin 03/04/2017   in situ-left outer sup, shin (txpbx), in situ- right 2nd knuckle finger (txpbx), in situ- Left wrist (txpbx)   Squamous cell carcinoma of skin 04/06/2018   in situ-above left knee inner (txpbx)   Squamous cell carcinoma of skin 04/21/2018   in situ-right top hand (txpbx)   Squamous cell carcinoma of skin 07/08/2018   in situ-right lower inner shin (txpbx)   Squamous cell carcinoma of skin 04/27/2019   in situ-  Left neck(CX35FU), In situ- right neck (CX35FU)   Urinary retention    sees Dr.Wrenn about 2 times a yr    Past Surgical History:  Procedure Laterality Date   ABDOMINAL HYSTERECTOMY     1985   ANTERIOR CERVICAL DECOMP/DISCECTOMY FUSION N/A 04/23/2022   Procedure: Anterior Cervical Decompression/Discectomy Fusion - Cervical Seven-Thoracic One;  Surgeon: Louis Shove, MD;  Location: [facility];  Service: Neurosurgery;  Laterality: N/A;  3C   APPENDECTOMY     with TAH   BACK SURGERY     several   BREAST BIOPSY Right    Results were negative   BROW LIFT Bilateral 10/02/2016   Procedure: BILATERAL LOWER LID BLEPHAROPLASTY;  Surgeon: Laurie Loyd Redhead, MD;  Location: [facility] OR;  Service: Plastics;  Laterality: Bilateral;   CARPAL TUNNEL RELEASE Right    cataracts     CERVICAL FUSION     Dr Leeann   CERVICAL FUSION  12/31/2012   Dr Leeann   CERVICAL FUSION  04/2022   CHEST TUBE INSERTION     for traumatic Pneumothorax   COLONOSCOPY  07/16/2010   normal    COLONOSCOPY W/ POLYPECTOMY  1997   negative since; Dr Debrah   ESOPHAGOGASTRODUODENOSCOPY  07/16/2010   normal   eye lid raise     EYE SURGERY Bilateral    cataracts removed - /w IOL   HARDWARE REMOVAL  08/2021   HIP CLOSED REDUCTION Right 11/16/2023   Procedure: CLOSED MANIPULATION, JOINT, HIP;  Surgeon: Ernie Cough, MD;  Location: [facility];  Service: Orthopedics;  Laterality: Right;   JOINT REPLACEMENT     LUMBAR FUSION     Dr Leeann   NASAL SINUS SURGERY     ORIF ELBOW FRACTURE Right 11/20/2023   Procedure: OPEN REDUCTION INTERNAL FIXATION (ORIF) ELBOW/OLECRANON FRACTURE;  Surgeon: [name] Franky SQUIBB, MD;  Location: [facility];  Service: Orthopedics;  Laterality: Right;   POSTERIOR CERVICAL FUSION/FORAMINOTOMY N/A 12/31/2012   Procedure: POSTERIOR LATERAL CERVICAL FUSION/FORAMINOTOMY LEVEL 1 CERVICAL THREE-FOUR WITH LATERAL MASS SCREWS;  Surgeon: Catalina CHRISTELLA Leeann, MD;  Location: [facility];  Service: Neurosurgery;  Laterality: N/A;    PTOSIS REPAIR Bilateral 10/02/2016   Procedure: INTERNAL PTOSIS REPAIR;  Surgeon: Laurie Loyd Redhead, MD;  Location: [facility];  Service: Plastics;  Laterality: Bilateral;   SKIN BIOPSY     THYROID  SURGERY     R lobe removed- 1972, has grown back - CT last done- 2018   TOTAL HIP ARTHROPLASTY  12/22/2011   Procedure: TOTAL HIP ARTHROPLASTY;  Surgeon: Dempsey JINNY Sensor, MD;  Location: [facility];  Service: Orthopedics;  Laterality: Left;   TOTAL HIP ARTHROPLASTY Right 11/12/2023   Procedure: ARTHROPLASTY, HIP, TOTAL, ANTERIOR APPROACH;  Surgeon: Fidel Rogue, MD;  Location: [facility];  Service: Orthopedics;  Laterality: Right;   TOTAL SHOULDER ARTHROPLASTY     TUBAL LIGATION     UPPER GASTROINTESTINAL ENDOSCOPY  2012   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   WRIST SURGERY Left 09/16/2022   wrist/arm     reports that she quit smoking about 42 years ago. Her smoking use included cigarettes. She started smoking about 62 years ago. She has a 50 pack-year smoking history. She has never been exposed to tobacco smoke. She has never used smokeless tobacco. She reports current alcohol  use of about 14.0 standard drinks of alcohol  per week. She reports that she does not use drugs.  Allergies  Allergen Reactions   Darvon [Propoxyphene Hcl] Shortness Of Breath    ? Dose Related ? Lowered respirations greatly   Demerol [Meperidine] Shortness Of Breath and Other (See Comments)    Respiratory Distress   Penicillins Hives and Other (See Comments)    Has patient had a PCN reaction causing immediate rash, facial/tongue/throat swelling, SOB or lightheadedness with hypotension: No SEVERE RASH INVOLVING MUCUS MEMBRANES or SKIN NECROSIS: #  #  #  YES  #  #  #  Has patient had a PCN reaction that required hospitalization No Has patient had a PCN reaction occurring within the last 10 years: No.    Imuran [Azathioprine] Other (See Comments)    Weakness   Sulfa  Antibiotics Nausea Only and Other (See Comments)    States had  severe indigestion and heartburn and nausea and had to stop taking   Arava [Leflunomide] Other (See Comments)    Excessive weight gain   Lipitor [Atorvastatin ] Nausea And Vomiting    Currently taking 2 times weekly - able to tolerate low dose    Vytorin [Ezetimibe-Simvastatin] Nausea And Vomiting    Family History  Problem Relation Age of Onset   Cancer Mother        pancreatic   Diabetes Mother    Pancreatic cancer Mother    Heart disease Father        Rheumatic   Cancer Father        ? stomach   Stomach cancer Father    Cancer Sister        stomach   Stomach cancer Sister    Cancer Maternal Aunt        X 4; ? primary   Heart disease Paternal Aunt    Cancer Maternal Grandmother        cervical   Colon cancer Other        Aunts   Multiple sclerosis Daughter    Hypertension Neg Hx    Stroke Neg Hx    Esophageal cancer Neg Hx    Rectal cancer Neg Hx     Prior to Admission medications   Medication Sig Start Date End Date Taking? Authorizing Provider  acetaminophen  (TYLENOL ) 325 MG tablet Take 2 tablets (650 mg total) by mouth every 6 (six) hours as needed for mild pain (pain score 1-3) or fever (or Fever >/= 101). 11/20/23  Yes Dorinda Drue DASEN, MD  aspirin  (ASPIRIN  CHILDRENS) 81 MG chewable tablet Chew 1 tablet (81 mg total) by mouth 2 (two) times daily with a meal. 11/13/23 12/28/23 Yes Hill, Valery RAMAN, PA-C  atorvastatin  (LIPITOR) 10 MG tablet TAKE 1 TAB ON MONDAYS AND FRIDAY 02/26/23  Yes Burns, Glade PARAS, MD  Biotin  5000 MCG TABS Take 10,000 mcg by mouth in the morning.   Yes [provider]  Carboxymethylcellul-Glycerin  (LUBRICATING  EYE DROPS OP) Place 1 drop into both eyes daily as needed (dry eyes).   Yes [provider]  carvedilol  (COREG ) 3.125 MG tablet Take 1 tablet (3.125 mg total) by mouth 2 (two) times daily with a meal. 11/10/23  Yes Burns, Glade PARAS, MD  cephALEXin  (KEFLEX ) 500 MG capsule Take 500 mg by mouth 3 (three) times daily. For 10 days   Yes  [provider]  chlorhexidine  (HIBICLENS ) 4 % external liquid Apply 15 mLs (1 Application total) topically as directed for 30 doses. Use as directed daily for 5 days every other week for 6 weeks. 11/12/23  Yes Hill, Valery RAMAN, PA-C  cholecalciferol  (VITAMIN D ) 25 MCG (1000 UNIT) tablet Take 1,000 Units by mouth in the morning.   Yes [provider]  cyclobenzaprine  (FLEXERIL ) 10 MG tablet TAKE 1 TABLET BY MOUTH THREE TIMES A DAY AS NEEDED FOR MUSCLE SPASM Patient taking differently: Take 10 mg by mouth at bedtime. 06/11/23  Yes Burns, Glade PARAS, MD  denosumab  (PROLIA ) 60 MG/ML SOSY injection Inject 60 mg into the skin every 6 (six) months. Deliver to rheum: [address], [place], [place] [address]. Appt on 07/23/2022 07/17/22 11/26/24 Yes Cheryl Waddell HERO, PA-C  diazepam  (VALIUM ) 5 MG tablet TAKE 1 TABLET BY MOUTH AT BEDTIME AS NEEDED FOR MUSCLE SPASMS. Patient taking differently: Take 5 mg by mouth daily as needed for muscle spasms. 04/28/23  Yes Burns, Glade PARAS, MD  docusate sodium  (COLACE) 100 MG capsule Take 1 capsule (100 mg total) by mouth daily. 11/21/23  Yes Dorinda Drue DASEN, MD  DULoxetine  (CYMBALTA ) 20 MG capsule Take 20 mg by mouth at bedtime.   Yes [provider]  folic acid  (FOLVITE ) 1 MG tablet Take 1 tablet (1 mg total) by mouth daily. 09/02/23  Yes Cheryl Waddell HERO, PA-C  furosemide  (LASIX ) 20 MG tablet TAKE 1 TABLET BY MOUTH EVERY DAY 08/19/23  Yes Burns, Glade PARAS, MD  methotrexate  (RHEUMATREX) 2.5 MG tablet Take 5 tablets (12.5 mg total) by mouth once a week. Caution:Chemotherapy. Protect from light. 09/02/23  Yes Cheryl Waddell HERO, PA-C  mupirocin  ointment (BACTROBAN ) 2 % Place 1 Application into the nose 2 (two) times daily for 60 doses. Use as directed 2 times daily for 5 days every other week for 6 weeks. 11/12/23 12/12/23 Yes Hill, Valery RAMAN, PA-C  oxybutynin  (DITROPAN ) 5 MG tablet Take 5 mg by mouth daily. 11/06/23  Yes [provider]  pantoprazole  (PROTONIX ) 40  MG tablet Take 1 tablet (40 mg total) by mouth 2 (two) times daily before a meal. 07/27/23  Yes Burns, Glade PARAS, MD  polyethylene glycol (MIRALAX  / GLYCOLAX ) 17 g packet Take 17 g by mouth daily as needed for mild constipation. 11/20/23  Yes Dorinda Drue DASEN, MD  predniSONE  (DELTASONE ) 5 MG tablet TAKE 1 TABLET BY MOUTH EVERY DAY IN THE MORNING 05/22/23  Yes Cheryl Waddell HERO, PA-C  senna (SENOKOT) 8.6 MG tablet Take 1 tablet by mouth 2 (two) times daily.   Yes [provider]  traMADol  (ULTRAM ) 50 MG tablet Take 25-50 mg by mouth every 8 (eight) hours as needed for moderate pain (pain score 4-6).   Yes [provider]  bisacodyl  (DULCOLAX) 10 MG suppository Place 1 suppository (10 mg total) rectally daily as needed for moderate constipation. 11/20/23   Dorinda Drue DASEN, MD  diclofenac sodium (VOLTAREN) 1 % GEL APPLY 3 GRAMS TO 3 LARGE JOINTS 3 TIMES A DAY AS NEEDED Patient not taking: Reported  on 11/27/2023 01/27/18   Dolphus Reiter, MD  Upadacitinib  ER ([name] ) 15 MG TB24 Take 15 mg by mouth daily. Patient not taking: Reported on 11/27/2023    [provider]    Physical Exam: Constitutional: Moderately built and nourished. Vitals:   11/26/23 2315 11/27/23 0000 11/27/23 0015 11/27/23 0227  BP: 102/60 109/62 125/73 (!) 106/59  Pulse: 99 95 98 97  Resp:   16 19  Temp:   98 F (36.7 C) 98.3 F (36.8 C)  TempSrc:   Oral   SpO2: 99% 100% 100% 97%  Weight:      Height:       Eyes: Anicteric no pallor. ENMT: No discharge from the ears/nose or mouth. Neck: No mass felt.  No neck rigidity. Respiratory: No rhonchi or crepitations. Cardiovascular: S1-S2 heard. Abdomen: Soft nontender bowel sound present. Musculoskeletal: No edema. Skin: Chronic skin changes of the lower extremities. Neurologic: Alert awake oriented person and place.  Moving all extremities. Psychiatric: Appears normal.  Normal affect.   Labs on Admission: I have personally reviewed following labs and imaging  studies  CBC: Recent Labs  Lab 11/27/23 0015  WBC 11.8*  HGB 8.1*  HCT 25.5*  MCV 107.1*  PLT 351   Basic Metabolic Panel: Recent Labs  Lab 11/27/23 0015  NA 129*  K 3.6  CL 94*  CO2 24  GLUCOSE 103*  BUN 10  CREATININE 0.68  CALCIUM  7.8*   GFR: Estimated Creatinine Clearance: 41.6 mL/min (by C-G formula based on SCr of 0.68 mg/dL). Liver Function Tests: No results for input(s): AST, ALT, ALKPHOS, BILITOT, PROT, ALBUMIN  in the last 168 hours. No results for input(s): LIPASE, AMYLASE in the last 168 hours. No results for input(s): AMMONIA in the last 168 hours. Coagulation Profile: No results for input(s): INR, PROTIME in the last 168 hours. Cardiac Enzymes: No results for input(s): CKTOTAL, CKMB, CKMBINDEX, TROPONINI in the last 168 hours. BNP (last 3 results) No results for input(s): PROBNP in the last 8760 hours. HbA1C: No results for input(s): HGBA1C in the last 72 hours. CBG: Recent Labs  Lab 11/20/23 1144  GLUCAP 102*   Lipid Profile: No results for input(s): CHOL, HDL, LDLCALC, TRIG, CHOLHDL, LDLDIRECT in the last 72 hours. Thyroid  Function Tests: No results for input(s): TSH, T4TOTAL, FREET4, T3FREE, THYROIDAB in the last 72 hours. Anemia Panel: No results for input(s): VITAMINB12, FOLATE, FERRITIN, TIBC, IRON, RETICCTPCT in the last 72 hours. Urine analysis:    Component Value Date/Time   COLORURINE YELLOW 10/02/2023 2314   APPEARANCEUR CLEAR 10/02/2023 2314   LABSPEC 1.012 10/02/2023 2314   PHURINE 8.0 10/02/2023 2314   GLUCOSEU NEGATIVE 10/02/2023 2314   GLUCOSEU NEGATIVE 04/10/2020 0914   HGBUR SMALL (A) 10/02/2023 2314   HGBUR trace-lysed 05/29/2010 0750   BILIRUBINUR NEGATIVE 10/02/2023 2314   KETONESUR NEGATIVE 10/02/2023 2314   PROTEINUR NEGATIVE 10/02/2023 2314   UROBILINOGEN 0.2 04/10/2020 0914   NITRITE NEGATIVE 10/02/2023 2314   LEUKOCYTESUR TRACE (A) 10/02/2023  2314   Sepsis Labs: @LABRCNTIP (procalcitonin:4,lacticidven:4) )No results found for this or any previous visit (from the past 240 hours).   Radiological Exams on Admission: DG ELBOW COMPLETE RIGHT (3+VIEW) Result Date: 11/27/2023 CLINICAL DATA:  [id number] Fracture of right olecranon process [id number] EXAM: RIGHT ELBOW - COMPLETE 3+ VIEW COMPARISON:  X-ray right elbow 11/20/2023, x-ray right elbow 11/12/2023 FINDINGS: Screw fixation of olecranon fracture with interval migration of the screw and fracture fragment out of the screw and alignments. Trace periosteal reaction along the olecranon.  Question destruction of the fracture fragments. No acute fracture identified. No dislocation. No definite joint effusion. Subcutaneus soft tissue edema. IMPRESSION: 1. Nonhealing fracture of the olecranon with migrated/malpositioned screw. 2. Question destruction of the fracture fragments. Underlying infection/osteomyelitis not excluded. Electronically Signed   By: Morgane  Naveau M.D.   On: 11/27/2023 01:09     Assessment/Plan Principal Problem:   Wound dehiscence Active Problems:   Hyperlipidemia   Multiple sclerosis (HCC)   Essential hypertension   Rheumatoid arthritis (HCC)    Right elbow visible hardware/wound dehiscence -     preoperative empiric antibiotics were started by orthopedics.  Further antibiotics to be decided by orthopedics.  N.p.o. past midnight in anticipation of surgery. Hyponatremia follow-up metabolic panel.  Check urine studies.  Cause not clear. Macrocytic anemia appears to be chronic.  Follow CBC.  Check B12 and folate levels. Hypertension on carvedilol . Rheumatoid arthritis takes prednisone  and methotrexate . History of multiple sclerosis.  No longer on disease modifying medications. Chronic pain on Cymbalta . History of urinary retention on oxybutynin . Recently had right hip surgery also in month of May 2025 was admitted for sepsis from right lower extremity cellulitis.  Since  patient will need surgery for the exposed hardware in right elbow will need more than 2 midnight stay.   DVT prophylaxis: SCDs. Code Status: Full code. Family Communication: Patient's daughter. Disposition Plan: Medical floor. Consults called: Orthopedics. Admission status: Observation.

## 2023-11-27 NOTE — ED Provider Notes (Addendum)
 Ortho to consult for OR management.  Requests medicine admit for medical management.    I consulted with Dr. Franky, who is appreciated for admitting.   Vicky Charleston, PA-C 11/27/23 9947    Albertina Dixon, MD 11/27/23 2351

## 2023-11-27 NOTE — Progress Notes (Addendum)
 [facility] PROGRESS NOTE    Progress Note  Haeven  A Sula  FMW:[id number] DOB: 01-24-1941 DOA: 11/26/2023 PCP: [name] PARAS, MD     Brief Narrative:   NINA MONDOR is an 83 y.o. 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   Assessment/Plan:   Right elbow visible hardware with Wound dehiscence with cellulitis Started empirically on antibiotics peri-operatively. Orthopedic surgery to recommend further antibiotics. Will place NPO, for possible I&D of the right elbow on 11/27/2023. Blood cultures ordered on 11/27/2023. Continue IV Vanco and Rocephin .  Hypovolemic hyponatremia: Started on IV fluids basic metabolic panels pending this morning. Continue strict I's and O's and daily weights.  Macrocytic anemia: B12 and folate have been checked. Hemoglobin is stable around 8.  Rheumatoid arthritis: Continue methotrexate  and prednisone . Will have low threshold to give her stress dose steroids in the setting of possible infection.  History of multiple sclerosis: She is no longer on these medication.  Chronic pain: Continue Cymbalta .  Urinary retention: Continue oxybutynin .  History of recent right hip surgery in May 2025: She will need skilled nursing facility.  Goals of care: Will need to consult hospice and palliative care to address goals of care     DVT prophylaxis: lovenox  Family Communication:none Status is: Observation The patient will require care spanning > 2 midnights and should be moved to inpatient because: Right elbow wound dehiscence with visible hardware and possible cellulitis    Code Status:      Code Status Orders  (From admission, onward)           Start     Ordered   11/27/23 0348  Full code  Continuous       Question:  By:  Answer:  Consent: discussion documented in EHR   11/27/23 0347           Code Status History     Date Active Date Inactive Code Status Order ID Comments User Context   11/10/2023 2225 11/20/2023 2039 Full Code [id number]  Dena Charleston, MD Inpatient   10/03/2023 0011 10/09/2023 1603 Full Code [id number]  Tobie Jorie SAUNDERS, MD ED   04/23/2022 1128 04/24/2022 1948 Full Code [id number]  Louis Shove, MD Inpatient   09/20/2021 2106 09/21/2021 1508 Full Code [id number]  Louis Shove, MD Inpatient   08/19/2021 1149 08/20/2021 1526 Full Code [id number]  Louis Shove, MD Inpatient         IV Access:   Peripheral IV   Procedures and diagnostic studies:   DG ELBOW COMPLETE RIGHT (3+VIEW) Result Date: 11/27/2023 CLINICAL DATA:  [id number] Fracture of right olecranon process [id number] EXAM: RIGHT ELBOW - COMPLETE 3+ VIEW COMPARISON:  X-ray right elbow 11/20/2023, x-ray right elbow 11/12/2023 FINDINGS: Screw fixation of olecranon fracture with interval migration of the screw and fracture fragment out of the screw and alignments. Trace periosteal reaction along the olecranon. Question destruction of the fracture fragments. No acute fracture identified. No dislocation. No definite joint effusion. Subcutaneus soft tissue edema. IMPRESSION: 1. Nonhealing fracture of the olecranon with migrated/malpositioned screw. 2. Question destruction of the fracture fragments. Underlying infection/osteomyelitis not excluded. Electronically Signed   By: Morgane  Naveau M.D.   On: 11/27/2023 01:09     Medical Consultants:  None.   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  Objective:    Vitals:   11/27/23 0000 11/27/23 0015 11/27/23 0227 11/27/23 0459  BP: 109/62 125/73 (!) 106/59 105/70  Pulse: 95 98 97 92   Resp:  16 19 19   Temp:  98 F (36.7 C) 98.3 F (36.8 C) 98.3 F (36.8 C)  TempSrc:  Oral    SpO2: 100% 100% 97% 95%  Weight:      Height:       SpO2: 95 %  No intake or output data in the 24 hours ending 11/27/23 0640 Filed Weights   11/26/23 2012  Weight: 48.6 kg    Exam: General exam: In no acute distress. Respiratory system: Good air movement and clear to auscultation. Cardiovascular system: S1 & S2 heard, RRR. No JVD. Gastrointestinal system: Abdomen is nondistended, soft and nontender.  Extremities: No pedal edema. Skin: No rashes, lesions or ulcers Psychiatry: Judgement and insight appear normal. Mood & affect appropriate.    Data Reviewed:    Labs: Basic Metabolic Panel: Recent Labs  Lab 11/27/23 0015  NA 129*  K 3.6  CL 94*  CO2 24  GLUCOSE 103*  BUN 10  CREATININE 0.68  CALCIUM  7.8*   GFR Estimated Creatinine Clearance: 41.6 mL/min (by C-G formula based on SCr of 0.68 mg/dL). Liver Function Tests: No results for input(s): AST, ALT, ALKPHOS, BILITOT, PROT, ALBUMIN  in the last 168 hours. No results for input(s): LIPASE, AMYLASE in the last 168 hours. No results for input(s): AMMONIA in the last 168 hours. Coagulation profile No results for input(s): INR, PROTIME in the last 168 hours. COVID-19 Labs  No results for input(s): DDIMER, FERRITIN, LDH, CRP in the last 72 hours.  No results found for: SARSCOV2NAA  CBC: Recent Labs  Lab 11/27/23 0015  WBC 11.8*  HGB 8.1*  HCT 25.5*  MCV 107.1*  PLT 351   Cardiac Enzymes: No results for input(s): CKTOTAL, CKMB, CKMBINDEX, TROPONINI in the last 168 hours. BNP (last 3 results) No results for input(s): PROBNP in the last 8760 hours. CBG: Recent Labs  Lab 11/20/23 1144  GLUCAP 102*   D-Dimer: No results for input(s): DDIMER in the last 72 hours. Hgb A1c: No results for input(s): HGBA1C in the last 72 hours. Lipid Profile: No results for  input(s): CHOL, HDL, LDLCALC, TRIG, CHOLHDL, LDLDIRECT in the last 72 hours. Thyroid  function studies: No results for input(s): TSH, T4TOTAL, T3FREE, THYROIDAB in the last 72 hours.  Invalid input(s): FREET3 Anemia work up: No results for input(s): VITAMINB12, FOLATE, FERRITIN, TIBC, IRON, RETICCTPCT in the last 72 hours. Sepsis Labs: Recent Labs  Lab 11/27/23 0015  WBC 11.8*   Microbiology No results found for this or any previous visit (from the past 240 hours).   Medications:    acetaminophen   1,000 mg Oral Once   atorvastatin   10 mg Oral Daily   carvedilol   3.125 mg Oral BID WC   cephALEXin   500 mg Oral TID   cyclobenzaprine   10 mg Oral QHS   docusate sodium   100 mg Oral Daily   [START ON 11/28/2023] DULoxetine   20 mg Oral QHS   folic acid   1 mg Oral Daily   pantoprazole   40 mg Oral BID AC   povidone-iodine   2 Application Topical Once   predniSONE   5 mg Oral Q breakfast   senna  1 tablet Oral BID   Continuous  Infusions:   ceFAZolin  (ANCEF ) IV     vancomycin         LOS: 0 days   Erle Odell Castor  [facility]  11/27/2023, 6:40 AM

## 2023-11-27 NOTE — Progress Notes (Signed)
 PT Cancellation Note  Patient Details Name: Sherri [name] MRN: [id number] DOB: [birth year]   Cancelled Treatment:    Reason Eval/Treat Not Completed: Medical issues which prohibited therapy;Other (comment) (Pt with bladder scan of 900+ and RN readying to insert foley. Pt also scheduled for OR tomorrow for Rt elbow. PT will follow up at later date/time as schedule allows and pt able.)   Vernell KNIGHTS PT, DPT Acute Rehabilitation Services Office [phone]  11/27/23 2:06 PM

## 2023-11-27 NOTE — Plan of Care (Signed)

## 2023-11-27 NOTE — Plan of Care (Signed)

## 2023-11-27 NOTE — Progress Notes (Deleted)
 Pharmacy Antibiotic Note  Sherri [name] is a 83 y.o. female admitted on 11/26/2023 with visible hardware of right elbow/wound infection.  Pharmacy has been consulted for cefazolin /vancomycin  dosing.   PMH includes recent exploration and debridement of elbow with ORIF of right olecranon fracture  > sent home with keflex  for 10 days (11/20/2023)  -WBC 11, sCr 0.68 (~bl), afebrile -No culture data; MRSA PCR + at last admission -No antibiotics given  Plan: -Cefazolin  2g IV every 12 hours -Vancomycin  1000mg  IV x1 -Vancomycin  750mg  IV every 24 hours (AUC 550, Vd 0.5, IBW, sCr 0.8) -Monitor renal function -Follow up signs of clinical improvement, LOT, de-escalation of antibiotics  -F/u plans to return to OR for irrigation and debridement  Height: 5' 1 (154.9 cm) Weight: 48.6 kg (107 lb 2.3 oz) IBW/kg (Calculated) : 47.8  Temp (24hrs), Avg:98.3 F (36.8 C), Min:98 F (36.7 C), Max:98.7 F (37.1 C)  Recent Labs  Lab 11/27/23 0015  WBC 11.8*  CREATININE 0.68    Estimated Creatinine Clearance: 41.6 mL/min (by C-G formula based on SCr of 0.68 mg/dL).    Allergies  Allergen Reactions   Darvon [Propoxyphene Hcl] Shortness Of Breath    ? Dose Related ? Lowered respirations greatly   Demerol [Meperidine] Shortness Of Breath and Other (See Comments)    Respiratory Distress   Penicillins Hives and Other (See Comments)    Has patient had a PCN reaction causing immediate rash, facial/tongue/throat swelling, SOB or lightheadedness with hypotension: No SEVERE RASH INVOLVING MUCUS MEMBRANES or SKIN NECROSIS: #  #  #  YES  #  #  #  Has patient had a PCN reaction that required hospitalization No Has patient had a PCN reaction occurring within the last 10 years: No.    Imuran [Azathioprine] Other (See Comments)    Weakness   Sulfa  Antibiotics Nausea Only and Other (See Comments)    States had severe indigestion and heartburn and nausea and had to stop taking   Arava [Leflunomide] Other  (See Comments)    Excessive weight gain   Lipitor [Atorvastatin ] Nausea And Vomiting    Currently taking 2 times weekly - able to tolerate low dose    Vytorin [Ezetimibe-Simvastatin] Nausea And Vomiting    Antimicrobials this admission: Cefazolin  7/4 >>  Vancomycin  7/4 >>   Thank you for allowing pharmacy to be a part of this patient's care.  Lynwood Poplar, PharmD, BCPS Clinical Pharmacist 11/27/2023 4:01 AM

## 2023-11-28 ENCOUNTER — Other Ambulatory Visit: Payer: Self-pay

## 2023-11-28 ENCOUNTER — Encounter (HOSPITAL_COMMUNITY)
Admission: EM | Disposition: A | Discharge status: Rehabilitation Facility | Payer: Self-pay | Source: Home / Self Care | Attending: Family Medicine

## 2023-11-28 ENCOUNTER — Inpatient Hospital Stay (HOSPITAL_COMMUNITY)

## 2023-11-28 ENCOUNTER — Encounter (HOSPITAL_COMMUNITY): Payer: Self-pay | Admitting: Internal Medicine

## 2023-11-28 ENCOUNTER — Encounter (HOSPITAL_COMMUNITY): Admitting: Anesthesiology

## 2023-11-28 ENCOUNTER — Inpatient Hospital Stay (HOSPITAL_COMMUNITY): Admitting: Anesthesiology

## 2023-11-28 DIAGNOSIS — S52021K Displaced fracture of olecranon process without intraarticular extension of right ulna, subsequent encounter for closed fracture with nonunion: Secondary | ICD-10-CM

## 2023-11-28 DIAGNOSIS — Z7189 Other specified counseling: Secondary | ICD-10-CM | POA: Diagnosis not present

## 2023-11-28 DIAGNOSIS — I10 Essential (primary) hypertension: Secondary | ICD-10-CM

## 2023-11-28 DIAGNOSIS — L03113 Cellulitis of right upper limb: Secondary | ICD-10-CM | POA: Diagnosis not present

## 2023-11-28 DIAGNOSIS — E871 Hypo-osmolality and hyponatremia: Secondary | ICD-10-CM | POA: Diagnosis not present

## 2023-11-28 DIAGNOSIS — Z87891 Personal history of nicotine dependence: Secondary | ICD-10-CM

## 2023-11-28 DIAGNOSIS — Z515 Encounter for palliative care: Secondary | ICD-10-CM | POA: Diagnosis not present

## 2023-11-28 DIAGNOSIS — E785 Hyperlipidemia, unspecified: Secondary | ICD-10-CM

## 2023-11-28 DIAGNOSIS — T8130XA Disruption of wound, unspecified, initial encounter: Secondary | ICD-10-CM | POA: Diagnosis not present

## 2023-11-28 HISTORY — PX: INCISION AND DRAINAGE OF WOUND: SHX1803

## 2023-11-28 LAB — BASIC METABOLIC PANEL WITH GFR
Anion gap: 7 (ref 5–15)
BUN: 7 mg/dL — ABNORMAL LOW (ref 8–23)
CO2: 27 mmol/L (ref 22–32)
Calcium: 7.6 mg/dL — ABNORMAL LOW (ref 8.9–10.3)
Chloride: 99 mmol/L (ref 98–111)
Creatinine, Ser: 0.65 mg/dL (ref 0.44–1.00)
GFR, Estimated: >60 mL/min (ref >60)
Glucose, Bld: 85 mg/dL (ref 70–99)
Potassium: 3.4 mmol/L — ABNORMAL LOW (ref 3.5–5.1)
Sodium: 133 mmol/L — ABNORMAL LOW (ref 135–145)

## 2023-11-28 SURGERY — IRRIGATION AND DEBRIDEMENT WOUND
Anesthesia: General | Site: Elbow | Laterality: Right

## 2023-11-28 MED ORDER — CHLORHEXIDINE GLUCONATE 0.12 % MT SOLN
15.0000 mL | Freq: Once | OROMUCOSAL | Status: AC
  Administered 2023-11-28: 15 mL via OROMUCOSAL

## 2023-11-28 MED ORDER — FENTANYL CITRATE (PF) 100 MCG/2ML IJ SOLN: 25.0000 ug | INTRAMUSCULAR | Status: DC | PRN

## 2023-11-28 MED ORDER — PROPOFOL 10 MG/ML IV BOLUS
INTRAVENOUS | Status: AC
Start: 2023-11-28 — End: 2023-11-28
  Filled 2023-11-28: qty 20

## 2023-11-28 MED ORDER — POLYETHYLENE GLYCOL 3350 17 G PO PACK
17.0000 g | PACK | Freq: Two times a day (BID) | ORAL | Status: AC
  Administered 2023-11-29 (×2): 17 g via ORAL
  Filled 2023-11-28 (×2): qty 1

## 2023-11-28 MED ORDER — ASPIRIN 81 MG PO TBEC: 81.0000 mg | DELAYED_RELEASE_TABLET | Freq: Two times a day (BID) | ORAL | Status: DC

## 2023-11-28 MED ORDER — VANCOMYCIN HCL 1000 MG IV SOLR
INTRAVENOUS | Status: DC | PRN
  Administered 2023-11-28: 1000 mg via TOPICAL

## 2023-11-28 MED ORDER — DEXAMETHASONE SODIUM PHOSPHATE 10 MG/ML IJ SOLN
INTRAMUSCULAR | Status: DC | PRN
  Administered 2023-11-28: 5 mg via INTRAVENOUS

## 2023-11-28 MED ORDER — LIDOCAINE 2% (20 MG/ML) 5 ML SYRINGE
INTRAMUSCULAR | Status: DC | PRN
  Administered 2023-11-28: 60 mg via INTRAVENOUS

## 2023-11-28 MED ORDER — ASPIRIN 81 MG PO TBEC
81.0000 mg | DELAYED_RELEASE_TABLET | Freq: Two times a day (BID) | ORAL | Status: DC
  Administered 2023-11-29 – 2023-12-03 (×10): 81 mg via ORAL
  Filled 2023-11-28 (×10): qty 1

## 2023-11-28 MED ORDER — POTASSIUM CHLORIDE IN NACL 40-0.9 MEQ/L-% IV SOLN
INTRAVENOUS | Status: AC
  Filled 2023-11-28 (×2): qty 1000

## 2023-11-28 MED ORDER — DEXAMETHASONE SODIUM PHOSPHATE 10 MG/ML IJ SOLN
INTRAMUSCULAR | Status: AC
Start: 2023-11-28 — End: 2023-11-28
  Filled 2023-11-28: qty 1

## 2023-11-28 MED ORDER — FENTANYL CITRATE (PF) 250 MCG/5ML IJ SOLN
INTRAMUSCULAR | Status: DC | PRN
  Administered 2023-11-28: 75 ug via INTRAVENOUS
  Administered 2023-11-28 (×2): 25 ug via INTRAVENOUS

## 2023-11-28 MED ORDER — ACETAMINOPHEN 500 MG PO TABS
ORAL_TABLET | ORAL | Status: AC
Start: 2023-11-28 — End: 2023-11-28
  Filled 2023-11-28: qty 2

## 2023-11-28 MED ORDER — VANCOMYCIN HCL 1000 MG IV SOLR
INTRAVENOUS | Status: AC
  Filled 2023-11-28: qty 20

## 2023-11-28 MED ORDER — PHENYLEPHRINE 80 MCG/ML (10ML) SYRINGE FOR IV PUSH (FOR BLOOD PRESSURE SUPPORT)
PREFILLED_SYRINGE | INTRAVENOUS | Status: AC
  Filled 2023-11-28: qty 10

## 2023-11-28 MED ORDER — SODIUM CHLORIDE 0.9 % IR SOLN
Status: DC | PRN
  Administered 2023-11-28: 3000 mL

## 2023-11-28 MED ORDER — ONDANSETRON HCL 4 MG/2ML IJ SOLN
INTRAMUSCULAR | Status: DC | PRN
  Administered 2023-11-28: 4 mg via INTRAVENOUS

## 2023-11-28 MED ORDER — ORAL CARE MOUTH RINSE: 15.0000 mL | Freq: Once | OROMUCOSAL | Status: AC

## 2023-11-28 MED ORDER — LIDOCAINE 2% (20 MG/ML) 5 ML SYRINGE
INTRAMUSCULAR | Status: AC
Start: 2023-11-28 — End: 2023-11-28
  Filled 2023-11-28: qty 5

## 2023-11-28 MED ORDER — CEFAZOLIN SODIUM-DEXTROSE 2-4 GM/100ML-% IV SOLN
INTRAVENOUS | Status: AC
  Filled 2023-11-28: qty 100

## 2023-11-28 MED ORDER — VANCOMYCIN HCL IN DEXTROSE 1-5 GM/200ML-% IV SOLN: 1000.0000 mg | Freq: Once | INTRAVENOUS | Status: DC

## 2023-11-28 MED ORDER — AMISULPRIDE (ANTIEMETIC) 5 MG/2ML IV SOLN: 10.0000 mg | Freq: Once | INTRAVENOUS | Status: DC | PRN

## 2023-11-28 MED ORDER — PROPOFOL 10 MG/ML IV BOLUS
INTRAVENOUS | Status: DC | PRN
  Administered 2023-11-28: 30 mg via INTRAVENOUS
  Administered 2023-11-28: 100 mg via INTRAVENOUS
  Administered 2023-11-28: 10 mg via INTRAVENOUS

## 2023-11-28 MED ORDER — CEFAZOLIN SODIUM-DEXTROSE 2-4 GM/100ML-% IV SOLN
2.0000 g | Freq: Three times a day (TID) | INTRAVENOUS | Status: AC
  Administered 2023-11-28 – 2023-11-29 (×2): 2 g via INTRAVENOUS
  Filled 2023-11-28 (×2): qty 100

## 2023-11-28 MED ORDER — VASHE WOUND IRRIGATION OPTIME
TOPICAL | Status: DC | PRN
  Administered 2023-11-28: 34 [oz_av]

## 2023-11-28 MED ORDER — CEFAZOLIN SODIUM-DEXTROSE 2-4 GM/100ML-% IV SOLN
2.0000 g | Freq: Once | INTRAVENOUS | Status: AC
  Administered 2023-11-28: 2 g via INTRAVENOUS

## 2023-11-28 MED ORDER — PHENYLEPHRINE 80 MCG/ML (10ML) SYRINGE FOR IV PUSH (FOR BLOOD PRESSURE SUPPORT)
PREFILLED_SYRINGE | INTRAVENOUS | Status: DC | PRN
  Administered 2023-11-28: 80 ug via INTRAVENOUS
  Administered 2023-11-28: 120 ug via INTRAVENOUS

## 2023-11-28 MED ORDER — FENTANYL CITRATE (PF) 250 MCG/5ML IJ SOLN
INTRAMUSCULAR | Status: AC
  Filled 2023-11-28: qty 5

## 2023-11-28 MED ORDER — ONDANSETRON HCL 4 MG/2ML IJ SOLN
INTRAMUSCULAR | Status: AC
  Filled 2023-11-28: qty 2

## 2023-11-28 MED ORDER — ONDANSETRON HCL 4 MG/2ML IJ SOLN: 4.0000 mg | Freq: Once | INTRAMUSCULAR | Status: DC | PRN

## 2023-11-28 MED ORDER — VANCOMYCIN HCL IN DEXTROSE 1-5 GM/200ML-% IV SOLN
INTRAVENOUS | Status: AC
  Filled 2023-11-28: qty 200

## 2023-11-28 MED ORDER — CHLORHEXIDINE GLUCONATE 0.12 % MT SOLN
OROMUCOSAL | Status: AC
  Filled 2023-11-28: qty 15

## 2023-11-28 MED ORDER — LACTATED RINGERS IV SOLN: INTRAVENOUS | Status: DC

## 2023-11-28 SURGICAL SUPPLY — 36 items
BLADE SURG 10 STRL SS (BLADE) ×2 IMPLANT
BNDG ELASTIC 4X5.8 VLCR NS LF (GAUZE/BANDAGES/DRESSINGS) IMPLANT
CLEANSER WND VASHE 34 (WOUND CARE) IMPLANT
COVER SURGICAL LIGHT HANDLE (MISCELLANEOUS) ×2 IMPLANT
DRAPE U-SHAPE 47X51 STRL (DRAPES) ×2 IMPLANT
DRSG ADAPTIC 3X8 NADH LF (GAUZE/BANDAGES/DRESSINGS) IMPLANT
ELECT CAUTERY BLADE 6.4 (BLADE) ×2 IMPLANT
ELECTRODE REM PT RTRN 9FT ADLT (ELECTROSURGICAL) IMPLANT
GAUZE PAD ABD 8X10 STRL (GAUZE/BANDAGES/DRESSINGS) IMPLANT
GAUZE SPONGE 4X4 12PLY STRL (GAUZE/BANDAGES/DRESSINGS) IMPLANT
GLOVE BIO SURGEON STRL SZ 6.5 (GLOVE) IMPLANT
GLOVE BIOGEL PI IND STRL 6.5 (GLOVE) IMPLANT
GLOVE BIOGEL PI IND STRL 8 (GLOVE) ×4 IMPLANT
GLOVE SURG ORTHO 8.0 STRL STRW (GLOVE) IMPLANT
GOWN STRL REUS W/ TWL LRG LVL3 (GOWN DISPOSABLE) ×4 IMPLANT
GOWN STRL REUS W/ TWL XL LVL3 (GOWN DISPOSABLE) ×4 IMPLANT
KIT BASIN OR (CUSTOM PROCEDURE TRAY) ×2 IMPLANT
KIT TURNOVER KIT B (KITS) ×2 IMPLANT
MANIFOLD NEPTUNE II (INSTRUMENTS) ×2 IMPLANT
NS IRRIG 1000ML POUR BTL (IV SOLUTION) ×4 IMPLANT
PACK ORTHO EXTREMITY (CUSTOM PROCEDURE TRAY) ×2 IMPLANT
PAD ARMBOARD POSITIONER FOAM (MISCELLANEOUS) ×4 IMPLANT
PAD CAST 4YDX4 CTTN HI CHSV (CAST SUPPLIES) IMPLANT
SET CYSTO W/LG BORE CLAMP LF (SET/KITS/TRAYS/PACK) ×2 IMPLANT
SOL PREP POV-IOD 4OZ 10% (MISCELLANEOUS) IMPLANT
SOLUTION SCRB POV-IOD 4OZ 7.5% (MISCELLANEOUS) IMPLANT
SPLINT PLASTER CAST FAST 5X30 (CAST SUPPLIES) IMPLANT
STOCKINETTE IMPERVIOUS 9X36 MD (GAUZE/BANDAGES/DRESSINGS) ×2 IMPLANT
SUT MON AB 2-0 CT1 36 (SUTURE) IMPLANT
SUT NYLON 3 0 (SUTURE) IMPLANT
SUTURE TAPE 1.3 40 TPR END (SUTURE) IMPLANT
TOWEL GREEN STERILE (TOWEL DISPOSABLE) ×2 IMPLANT
TUBE CONNECTING 12X1/4 (SUCTIONS) ×2 IMPLANT
UNDERPAD 30X36 HEAVY ABSORB (UNDERPADS AND DIAPERS) ×4 IMPLANT
WATER STERILE IRR 1000ML POUR (IV SOLUTION) ×2 IMPLANT
YANKAUER SUCT BULB TIP NO VENT (SUCTIONS) ×2 IMPLANT

## 2023-11-28 NOTE — Progress Notes (Signed)
     Subjective: Patient reports pain as mild in the right elbow, but notes that the right hip is sore.  She did have hip replacement done by Dr. Fidel complicated by postop dislocation and closed reduction by Dr. Ernie.   Objective:   VITALS:   Vitals:   11/27/23 0808 11/27/23 1625 11/27/23 2105 11/28/23 0444  BP: 118/66 107/74 (!) 102/54 (!) 129/59  Pulse: 95 84 (!) 56 92  Resp: 18 18 16 18   Temp: 99.3 F (37.4 C) 98.3 F (36.8 C) 98.2 F (36.8 C) 98.4 F (36.9 C)  TempSrc: Oral     SpO2: 98% 96% 92% 97%  Weight:      Height:        RUE dressing on the right upper extremity intact with small amount of bloody drainage over the posterior elbow  Sens median, radial, ulnar intact  Motor AIN, PIN, IO intact  Radial pulse 2+, No significant edema   Lab Results  Component Value Date   WBC 9.5 11/27/2023   HGB 8.1 (L) 11/27/2023   HCT 24.7 (L) 11/27/2023   MCV 102.1 (H) 11/27/2023   PLT 354 11/27/2023   BMET    Component Value Date/Time   NA 133 (L) 11/28/2023 0419   NA 139 06/20/2019 1335   NA 136 02/22/2015 0000   K 3.4 (L) 11/28/2023 0419   K 4.4 02/22/2015 0000   CL 99 11/28/2023 0419   CL 98 02/22/2015 0000   CO2 27 11/28/2023 0419   CO2 30 02/22/2015 0000   GLUCOSE 85 11/28/2023 0419   BUN 7 (L) 11/28/2023 0419   BUN 10 06/20/2019 1335   CREATININE 0.65 11/28/2023 0419   CREATININE 1.03 (H) 08/06/2023 1510   CALCIUM  7.6 (L) 11/28/2023 0419   CALCIUM  9.6 02/22/2015 0000   EGFR 55 (L) 08/06/2023 1510   GFRNONAA >60 11/28/2023 0419   GFRNONAA 56 (L) 09/26/2020 1536   Xray: Right elbow 3 view reviewed demonstrates displacement of the proximal olecranon component due to screw fixation failure   Assessment/Plan: * Day of Surgery *   Principal Problem:   Wound dehiscence Active Problems:   Hyperlipidemia   Multiple sclerosis (HCC)   Essential hypertension   Rheumatoid arthritis (HCC)   Hyponatremia   Macrocytic anemia   Cellulitis of right  elbow  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  Discussed findings with her index surgeon Dr. [name], and at this point would recommend hardware removal with debridement and wound closure.  Possible olecranon  excision and triceps advancement depending on tissue quality.  The risks benefits and alternatives were discussed with the patient including but not limited to the risks of nonoperative treatment, versus surgical intervention including infection, bleeding, nerve injury, malunion, nonunion, the need for revision surgery, hardware prominence, hardware failure, the need for hardware removal, blood clots, cardiopulmonary complications, morbidity, mortality, among others, and they were willing to proceed.     Tanish Prien A Sebrena Engh 11/28/2023, 7:43 AM   Toribio Higashi, MD  Contact information:   [phone] 7am-5pm epic message Dr. Higashi, or call office for patient follow up: [phone] After hours and holidays please check [email or web address] for group call information for [facility]

## 2023-11-28 NOTE — Progress Notes (Signed)
 [facility] PROGRESS NOTE    Progress Note  Sherri  [name]  FMW:[id number] DOB: Nov 12, 1940 DOA: 11/26/2023 PCP: [name] PARAS, MD     Brief Narrative:   Sherri [name] is an 83 y.o. 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   Assessment/Plan:   Right elbow visible hardware with Wound dehiscence with cellulitis Started empirically on antibiotics peri-operatively. Orthopedic surgery to recommend further antibiotics. Will place NPO, for possible I&D of the right elbow on 11/27/2023. Blood cultures ordered on 11/27/2023. Continue IV Vanco and Rocephin .  Hypovolemic hyponatremia: Sodium is improving continue IV fluids recheck in the morning.  Hypokalemia: Replete IV recheck in the morning  Macrocytic anemia: B12 and folate have been checked. Hemoglobin is stable around 8.  Rheumatoid arthritis: Continue methotrexate  and prednisone . Will have low threshold to give her stress dose steroids in the setting of possible infection.  History of multiple sclerosis: She is no longer on these medication.  Chronic pain: Continue Cymbalta .  Urinary retention: Continue oxybutynin .  History of recent right hip surgery in May 2025: She will need skilled nursing facility.  Goals of care: Palliative care to meet with family     DVT prophylaxis: lovenox  Family Communication:none Status is: Observation The patient will require care spanning > 2 midnights and should be moved to inpatient because: Right elbow wound dehiscence with visible hardware and possible cellulitis    Code Status:     Code Status Orders  (From  admission, onward)           Start     Ordered   11/27/23 0348  Full code  Continuous       Question:  By:  Answer:  Consent: discussion documented in EHR   11/27/23 0347           Code Status History     Date Active Date Inactive Code Status Order ID Comments User Context   11/10/2023 2225 11/20/2023 2039 Full Code [id number]  Dena Charleston, MD Inpatient   10/03/2023 0011 10/09/2023 1603 Full Code [id number]  Tobie Jorie SAUNDERS, MD ED   04/23/2022 1128 04/24/2022 1948 Full Code [id number]  Louis Shove, MD Inpatient   09/20/2021 2106 09/21/2021 1508 Full Code [id number]  Louis Shove, MD Inpatient   08/19/2021 1149 08/20/2021 1526 Full Code [id number]  Louis Shove, MD Inpatient         IV Access:   Peripheral IV   Procedures and diagnostic studies:   DG ELBOW COMPLETE RIGHT (3+VIEW) Result Date: 11/27/2023 CLINICAL DATA:  [id number] Fracture of right olecranon process [id number] EXAM: RIGHT ELBOW - COMPLETE 3+ VIEW COMPARISON:  X-ray right elbow 11/20/2023, x-ray right elbow 11/12/2023 FINDINGS: Screw fixation of olecranon fracture with interval migration of the screw and fracture fragment out of the screw and alignments. Trace periosteal reaction along the olecranon. Question destruction of the fracture fragments. No acute fracture identified. No dislocation. No definite joint effusion. Subcutaneus soft tissue edema. IMPRESSION: 1. Nonhealing fracture of the olecranon with migrated/malpositioned screw. 2. Question destruction of the fracture fragments. Underlying infection/osteomyelitis not excluded. Electronically Signed   By: Morgane  Naveau M.D.   On: 11/27/2023 01:09     Medical Consultants:   None.   Subjective:  [name] had a bowel movement  Objective:    Vitals:   11/27/23 1625 11/27/23 2105 11/28/23 0444 11/28/23 0804  BP: 107/74 (!) 102/54 (!) 129/59 132/66  Pulse: 84 (!) 56 92 90  Resp: 18 16 18 18   Temp: 98.3 F (36.8 C) 98.2 F (36.8 C) 98.4 F (36.9 C)  97.7 F (36.5 C)  TempSrc:    Oral  SpO2: 96% 92% 97% 96%  Weight:      Height:       SpO2: 96 %   Intake/Output Summary (Last 24 hours) at 11/28/2023 0848 Last data filed at 11/27/2023 1840 Gross per 24 hour  Intake 979.18 ml  Output 1375 ml  Net -395.82 ml   Filed Weights   11/26/23 2012  Weight: 48.6 kg    Exam: General exam: In no acute distress. Respiratory system: Good air movement and clear to auscultation. Cardiovascular system: S1 & S2 heard, RRR. No JVD. Gastrointestinal system: Abdomen is nondistended, soft and nontender.  Central nervous system: Alert and oriented. No focal neurological deficits. Extremities: No pedal edema. Skin:     Psychiatry: Judgement and insight appear normal. Mood & affect appropriate.  Data Reviewed:    Labs: Basic Metabolic Panel: Recent Labs  Lab 11/27/23 0015 11/27/23 0539 11/28/23 0419  NA 129* 131* 133*  K 3.6 3.3* 3.4*  CL 94* 95* 99  CO2 24 29 27   GLUCOSE 103* 80 85  BUN 10 9 7*  CREATININE 0.68 0.60 0.65  CALCIUM  7.8* 7.6* 7.6*   GFR Estimated Creatinine Clearance: 41.6 mL/min (by C-G formula based on SCr of 0.65 mg/dL). Liver Function Tests: No results for input(s): AST, ALT, ALKPHOS, BILITOT, PROT, ALBUMIN  in the last 168 hours. No results for input(s): LIPASE, AMYLASE in the last 168 hours. No results for input(s): AMMONIA in the last 168 hours. Coagulation profile No results for input(s): INR, PROTIME in the last 168 hours. COVID-19 Labs  Recent Labs    11/27/23 0827  FERRITIN 267    No results found for: SARSCOV2NAA  CBC: Recent Labs  Lab 11/27/23 0015 11/27/23 0539  WBC 11.8* 9.5  NEUTROABS  --  7.7  HGB 8.1* 8.1*  HCT 25.5* 24.7*  MCV 107.1* 102.1*  PLT 351 354   Cardiac Enzymes: No results for input(s): CKTOTAL, CKMB, CKMBINDEX, TROPONINI in the last 168 hours. BNP (last 3 results) No results for input(s): PROBNP in the last 8760 hours. CBG: No  results for input(s): GLUCAP in the last 168 hours.  D-Dimer: No results for input(s): DDIMER in the last 72 hours. Hgb A1c: No results for input(s): HGBA1C in the last 72 hours. Lipid Profile: No results for input(s): CHOL, HDL, LDLCALC, TRIG, CHOLHDL, LDLDIRECT in the last 72 hours. Thyroid  function studies: Recent Labs    11/27/23 0827  TSH 3.196   Anemia work up: Recent Labs    11/27/23 0827  VITAMINB12 497  FOLATE 17.9  FERRITIN 267  TIBC 199*  IRON 38  RETICCTPCT 5.9*   Sepsis Labs: Recent Labs  Lab 11/27/23 0015 11/27/23 0539  WBC 11.8* 9.5   Microbiology Recent Results (from the past 240 hours)  Culture, blood (Routine X 2) w Reflex to ID Panel     Status: None (Preliminary result)   Collection Time: 11/27/23  8:26 AM   Specimen: BLOOD RIGHT HAND  Result Value Ref Range Status   Specimen Description BLOOD RIGHT HAND  Final   Special Requests   Final    BOTTLES  DRAWN AEROBIC ONLY Blood Culture results may not be optimal due to an inadequate volume of blood received in culture bottles   Culture   Final    NO GROWTH < 24 HOURS Performed at [facility], [address] [place], [place] [address]    Report Status PENDING  Incomplete  Culture, blood (Routine X 2) w Reflex to ID Panel     Status: None (Preliminary result)   Collection Time: 11/27/23  8:27 AM   Specimen: BLOOD LEFT ARM  Result Value Ref Range Status   Specimen Description BLOOD LEFT ARM  Final   Special Requests   Final    BOTTLES DRAWN AEROBIC AND ANAEROBIC Blood Culture adequate volume   Culture   Final    NO GROWTH < 24 HOURS Performed at [facility], [address], [place], [place] [address]    Report Status PENDING  Incomplete     Medications:    acetaminophen   1,000 mg Oral Once   atorvastatin   10 mg Oral Daily   carvedilol   3.125 mg Oral BID WC   Chlorhexidine  Gluconate Cloth  6 each Topical Daily   cyclobenzaprine   10 mg Oral QHS   docusate  sodium  100 mg Oral Daily   DULoxetine   20 mg Oral QHS   folic acid   1 mg Oral Daily   pantoprazole   40 mg Oral BID AC   povidone-iodine   2 Application Topical Once   predniSONE   5 mg Oral Q breakfast   senna  1 tablet Oral BID   Continuous Infusions:  cefTRIAXone  (ROCEPHIN )  IV 1 g (11/28/23 0810)      LOS: 1 day   Erle Odell Castor  [facility]  11/28/2023, 8:48 AM

## 2023-11-28 NOTE — Progress Notes (Signed)
 PT Cancellation Note  Patient Details Name: Sherri [name] MRN: [id number] DOB: 06-07-1940   Cancelled Treatment:    Reason Eval/Treat Not Completed: Patient not medically ready (PT consult appreciated and chart reviewed. Pt scheduled to go to the OR today for Rt elbow I&D. Will follow-up for PT evaluation following procedure.)  Randall SAUNDERS, PT, DPT Acute Rehabilitation Services Office: [phone] Secure Chat Preferred  [name] 11/28/2023, 7:17 AM

## 2023-11-28 NOTE — Op Note (Addendum)
 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  ANESTHESIA:   General  PREOPERATIVE INDICATIONS:  Sherri [name] is a  83 y.o. female who had undergone ORIF of her left elbow olecranon fracture with Dr. [name] on 11/20/2023.  She presented to the emergency room 2 days ago with exposed hardware.  X-ray showed nonunion of her olecranon fracture.  Patient was admitted to medicine and medically optimized.  Plan for hardware removal olecranon excision and triceps advancement.  The risks benefits and alternatives were discussed with the patient preoperatively including but not limited to the risks of infection, bleeding, nerve injury, cardiopulmonary complications, the need for revision surgery, among others, and the patient was willing to proceed.  ESTIMATED BLOOD LOSS: 50cc  OPERATIVE FINDINGS:  successful removal of the olecranon screw as well as the proximal fragment.  Advancement and repair of the triceps with suture tape.  OPERATIVE PROCEDURE:  The patient was brought to the operating room and placed in the supine position. Gen. anesthesia was administered. The upper extremity was prepped and draped in usual sterile fashion. Time out was performed.  Preoperative antibiotics with 2 g Ancef  1 g vancomycin  was administered.  Nylon sutures were removed.  The olecranon screw was visible through the wound.  This was easily removed.  The distal or chronic fragment was visualized and felt to be nonviable.  It was skeletonized and  excised.   The wound was then thoroughly irrigated and debrided of all nonviable tissue.  3 L of normal saline pulse lavage were used to irrigate the wound.  Vashe irrigation was also utilized.  Vancomycin  powder was placed into the wound. The triceps tendon was identified.  Using a suture tape in a Krakw fashion it was secured and repaired to the remaining proximal ulna.  The remaining wound was repaired with 2-0 Monocryl and 3-0 nylon.  Well-padded dressing and a long-arm splint and relative extension was applied.  The patient was awakened from anesthesia and transported to the PACU in stable condition.   Post op recs: WB: NWB RUE Abx: ancef  periop and then dc on keflex  Imaging: PACU xrays DVT prophylaxis: Aspirin  81mg  daily for 4 weeks Follow up: 10-14 days after surgery for a wound check with Sherri [name] at [facility].  Address: [address], [place], [place] [address]  Office Phone: [phone]   Toribio Edna, MD Orthopaedic Surgery

## 2023-11-28 NOTE — Consult Note (Signed)
 Palliative Care Consult Note                                  Date: 11/28/2023   Patient Name: Sherri [name]  AYLLA HUFFINE  DOB: 17-May-1941  MRN: [id number]  Age / Sex: 83 y.o., female  PCP: [name] PARAS, MD Referring Physician: Odell Castor, Erle, MD  Reason for Consultation: Establishing goals of care  HPI/Patient Profile: 83 y.o. female  with past medical history of hypertension, rheumatoid arthritis, chronic pain, urinary retention, recently admitted for right hip fracture during which patient also had surgery for right elbow for fracture admitted on 11/26/2023 after home health aide noticed visible hardware on right elbow.    In the ER patient was evaluated by orthopedic surgery. X-ray shows fracture of the right elbow with malposition screw. Orthopedic surgery planning surgery today.   Past Medical History:  Diagnosis Date   Anxiety    takes Valium  daily as needed   Basal cell carcinoma 01/21/1988   left nostril (MOHS), sup-right calf (CX35FU)   Basal cell carcinoma 03/22/1996   right calf (CX35FU)   Basal cell carcinoma 03/31/1995   left wing nose   Bruises easily    d/t meds   Cancer (HCC)    basal cell ca, in situ- uterine    Cataracts, bilateral    removed bilateral   Chronic back pain    Dizziness    r/t to meds   GERD (gastroesophageal reflux disease)    no meds on a regular basis but will take Tums if needed   Headache(784.0)    r/t neck issues   History of bronchitis 6-53yrs ago   History of colon polyps    benign   HOH (hard of hearing)    wears hearing aids   Hyperlipidemia    takes Atorvastatin  on Mondays and Fridays   Hypertension    Joint pain    Joint swelling    Multiple sclerosis (HCC)    Neuromuscular disorder (HCC)    Dr. FABIENE Crete- [facility], follows M.S.   Nocturia    PONV (postoperative nausea and vomiting)    trouble urinating after surgery in 2014   Postoperative nausea and vomiting  01/11/2019   Rheumatoid arthritis (HCC)    Dr Lorrayne weekly, RA- hands- knees- feet    Rheumatoid arthritis(714.0)    Dr Dolphus takes Xeljanz  daily   Right wrist fracture    Skin cancer    Squamous cell carcinoma of skin 11/02/2001   in situ-right knee (cx76fu)   Squamous cell carcinoma of skin 11/12/2011   in situ-left forearm (CX35FU), in situ-left foot (CX35FU)   Squamous cell carcinoma of skin 01/22/2012   in situ-left lower forearm (txpbx)   Squamous cell carcinoma of skin 11/17/2012   right shin (txpbx)   Squamous cell carcinoma of skin 10/28/2013   in situ-right shoulder (CX35FU)   Squamous cell carcinoma of skin 04/28/2014   well diff-right shin (txpbx)   Squamous cell carcinoma of skin 11/02/2014   in situ-Left shin (txpbx)   Squamous cell carcinoma of skin 12/15/2014   in situ-Left hand (txpbx), bowens-left side chest (txpbx)  Squamous cell carcinoma of skin 05/09/2015   in situ-left hand (txpbx)    Squamous cell carcinoma of skin 05/07/2016   in situ-left inner shin,ant, in situ-left inner shin, post, in situ-left outer forearm, in situ-right knuckle   Squamous cell carcinoma of skin 03/1302018   in situ-left shoulder (txpbx), in situ-right inner shin (txpbx), in situ-top of left foot (txpbx), KA- left forearm (txpbx)   Squamous cell carcinoma of skin 12/02/2016   in situ-left inner shin (txpbx)   Squamous cell carcinoma of skin 03/04/2017   in situ-left outer sup, shin (txpbx), in situ- right 2nd knuckle finger (txpbx), in situ- Left wrist (txpbx)   Squamous cell carcinoma of skin 04/06/2018   in situ-above left knee inner (txpbx)   Squamous cell carcinoma of skin 04/21/2018   in situ-right top hand (txpbx)   Squamous cell carcinoma of skin 07/08/2018   in situ-right lower inner shin (txpbx)   Squamous cell carcinoma of skin 04/27/2019   in situ- Left neck(CX35FU), In situ- right neck (CX35FU)   Urinary retention    sees Dr.Wrenn about 2 times a  yr    Subjective:   I have reviewed medical records including EPIC notes, labs and imaging, received update from attending team, assessed the patient and then met with the patient and her daughter Suzen Bruns.  I introduced Palliative Medicine as specialized medical care for people living with serious illness. It focuses on providing relief from symptoms and stress of a serious illness. The goal is to improve quality of life for both the patient and the family. Patient became upset that PMT was consulted. Her daughter explained to the patient that PMT does not mean end of life. Daughter asked questions about PMT role. Explained role of PMT both inpatient and outpatient. Patient shared she has already made the decision to proceed with surgery. She has ACP documents at home. Her husband has a hard time getting to the hospital but she will try to get him to bring these documents to be scanned into the system.   Patient shared she does not need PMT services at this time. Told her I would discontinue consult. If they would like PMT to be re consulted please notify the attending provider.  Review of Systems  Musculoskeletal:        Pain in elbow    Objective:   Primary Diagnoses: Present on Admission:  Wound dehiscence  Hyperlipidemia  Multiple sclerosis (HCC)  Essential hypertension  Rheumatoid arthritis (HCC)  Hyponatremia  Cellulitis of right elbow   Physical Exam Vitals reviewed.  Constitutional:      General: She is not in acute distress. HENT:     Head: Normocephalic and atraumatic.  Cardiovascular:     Rate and Rhythm: Normal rate.  Pulmonary:     Effort: Pulmonary effort is normal.  Neurological:     Mental Status: She is alert and oriented to person, place, and time.  Psychiatric:        Mood and Affect: Mood normal.        Behavior: Behavior normal.        Thought Content: Thought content normal.        Judgment: Judgment normal.     Vital Signs:  BP 132/66  (BP Location: Left Arm)   Pulse 90   Temp 97.7 F (36.5 C) (Oral)   Resp 18   Ht 5' 1 (1.549 m)   Wt 48.6 kg   SpO2 96%   BMI 20.24 kg/m  Advanced Care Planning:   Existing Vynca/ACP Documentation: None  Primary Decision Maker: PATIENT  Code Status/Advance Care Planning: Full code  Assessment & Plan:   SUMMARY OF RECOMMENDATIONS   Full code Full scope Surgery planned for today Patient to bring in ACP documents PMT will sign off- Patient not interested in PMT services at this time    Discussed with: Dr. Odell Castor  Time Total: 40 minutes    Thank you for allowing us  to participate in the care of Lance  A Francis PMT will continue to support holistically.   Signed by: Stephane Palin, NP Palliative Medicine Team  Team Phone # [phone] (Nights/Weekends)  11/28/2023, 10:06 AM

## 2023-11-28 NOTE — Transfer of Care (Signed)
 Immediate Anesthesia Transfer of Care Note  Patient: Sherri [name]  [name]  Procedure(s) Performed: IRRIGATION AND DEBRIDEMENT WOUND (Right)  Patient Location: PACU  Anesthesia Type:General  Level of Consciousness: awake and alert   Airway & Oxygen Therapy: Patient Spontanous Breathing and Patient connected to face mask oxygen  Post-op Assessment: Report given to RN and Post -op Vital signs reviewed and stable  Post vital signs: Reviewed and stable  Last Vitals:  Vitals Value Taken Time  BP 126/71 11/28/23 15:55  Temp    Pulse 84 11/28/23 15:57  Resp 12 11/28/23 15:57  SpO2 97 % 11/28/23 15:57  Vitals shown include unfiled device data.  Last Pain:  Vitals:   11/28/23 1243  TempSrc: Oral  PainSc:          Complications: No notable events documented.

## 2023-11-28 NOTE — Anesthesia Procedure Notes (Signed)
 Procedure Name: LMA Insertion Date/Time: 11/28/2023 2:13 PM  Performed by: Roslynn Waddell LABOR, CRNAPre-anesthesia Checklist: Patient identified, Emergency Drugs available, Suction available and Patient being monitored Patient Re-evaluated:Patient Re-evaluated prior to induction Oxygen Delivery Method: Circle System Utilized Preoxygenation: Pre-oxygenation with 100% oxygen Induction Type: IV induction LMA: LMA inserted LMA Size: 3.0 Number of attempts: 1 Airway Equipment and Method: Bite block Placement Confirmation: positive ETCO2 Tube secured with: Tape Dental Injury: Teeth and Oropharynx as per pre-operative assessment  Comments: Atraumatic induction/LMA insertion. Dentition and oral mucosa as per preop.

## 2023-11-28 NOTE — Plan of Care (Signed)

## 2023-11-28 NOTE — Anesthesia Postprocedure Evaluation (Signed)
 Anesthesia Post Note  Patient: Sherri  [name]  Procedure(s) Performed: IRRIGATION AND DEBRIDEMENT WOUND; REMOVAL OF HARDWARE (Right: Elbow)     Patient location during evaluation: PACU Anesthesia Type: General Level of consciousness: awake Pain management: pain level controlled Vital Signs Assessment: post-procedure vital signs reviewed and stable Respiratory status: spontaneous breathing, nonlabored ventilation and respiratory function stable Cardiovascular status: blood pressure returned to baseline and stable Postop Assessment: no apparent nausea or vomiting Anesthetic complications: no   No notable events documented.  Last Vitals:  Vitals:   11/28/23 1630 11/28/23 1653  BP: (!) 148/68 112/66  Pulse: 81 84  Resp: 11 16  Temp: 36.6 C 36.6 C  SpO2: 95% 98%    Last Pain:  Vitals:   11/28/23 1630  TempSrc:   PainSc: 0-No pain                 Arika Mainer P Britiny Defrain

## 2023-11-28 NOTE — Anesthesia Preprocedure Evaluation (Addendum)
 Anesthesia Evaluation  Patient identified by MRN, date of birth, ID band Patient awake    Reviewed: Allergy & Precautions, NPO status , Patient's Chart, lab work & pertinent test results  Airway Mallampati: II  TM Distance: >3 FB Neck ROM: Full    Dental no notable dental hx.    Pulmonary former smoker   Pulmonary exam normal        Cardiovascular hypertension, Pt. on home beta blockers + Peripheral Vascular Disease  Normal cardiovascular exam     Neuro/Psych  Headaches  Anxiety     Multiple sclerosis  Neuromuscular disease    GI/Hepatic ,GERD  Medicated and Controlled,,(+)     substance abuse    Endo/Other  negative endocrine ROS    Renal/GU negative Renal ROS     Musculoskeletal  (+) Arthritis ,    Abdominal   Peds  Hematology negative hematology ROS (+)   Anesthesia Other Findings right olecranon skin breakdown  Reproductive/Obstetrics                              Anesthesia Physical Anesthesia Plan  ASA: 3  Anesthesia Plan: General   Post-op Pain Management:    Induction: Intravenous  PONV Risk Score and Plan: 3 and Ondansetron , Dexamethasone , Propofol  infusion and Treatment may vary due to age or medical condition  Airway Management Planned: LMA  Additional Equipment:   Intra-op Plan:   Post-operative Plan: Extubation in OR  Informed Consent: I have reviewed the patients History and Physical, chart, labs and discussed the procedure including the risks, benefits and alternatives for the proposed anesthesia with the patient or authorized representative who has indicated his/her understanding and acceptance.     Dental advisory given  Plan Discussed with: CRNA  Anesthesia Plan Comments:          Anesthesia Quick Evaluation

## 2023-11-28 NOTE — Plan of Care (Signed)
   Problem: Education: Goal: Knowledge of General Education information will improve Description Including pain rating scale, medication(s)/side effects and non-pharmacologic comfort measures Outcome: Progressing

## 2023-11-29 ENCOUNTER — Encounter (HOSPITAL_COMMUNITY): Payer: Self-pay | Admitting: Orthopedic Surgery

## 2023-11-29 DIAGNOSIS — M0579 Rheumatoid arthritis with rheumatoid factor of multiple sites without organ or systems involvement: Secondary | ICD-10-CM

## 2023-11-29 DIAGNOSIS — I10 Essential (primary) hypertension: Secondary | ICD-10-CM | POA: Diagnosis not present

## 2023-11-29 DIAGNOSIS — T8130XA Disruption of wound, unspecified, initial encounter: Secondary | ICD-10-CM | POA: Diagnosis not present

## 2023-11-29 LAB — BASIC METABOLIC PANEL WITH GFR
Anion gap: 7 (ref 5–15)
BUN: 5 mg/dL — ABNORMAL LOW (ref 8–23)
CO2: 24 mmol/L (ref 22–32)
Calcium: 7.6 mg/dL — ABNORMAL LOW (ref 8.9–10.3)
Chloride: 102 mmol/L (ref 98–111)
Creatinine, Ser: 0.53 mg/dL (ref 0.44–1.00)
GFR, Estimated: >60 mL/min (ref >60)
Glucose, Bld: 132 mg/dL — ABNORMAL HIGH (ref 70–99)
Potassium: 4.7 mmol/L (ref 3.5–5.1)
Sodium: 133 mmol/L — ABNORMAL LOW (ref 135–145)

## 2023-11-29 LAB — CBC
HCT: 20.8 % — ABNORMAL LOW (ref 36.0–46.0)
Hemoglobin: 6.6 g/dL — CL (ref 12.0–15.0)
MCH: 34.2 pg — ABNORMAL HIGH (ref 26.0–34.0)
MCHC: 31.7 g/dL (ref 30.0–36.0)
MCV: 107.8 fL — ABNORMAL HIGH (ref 80.0–100.0)
Platelets: 322 K/uL (ref 150–400)
RBC: 1.93 MIL/uL — ABNORMAL LOW (ref 3.87–5.11)
RDW: 17.4 % — ABNORMAL HIGH (ref 11.5–15.5)
WBC: 13.1 K/uL — ABNORMAL HIGH (ref 4.0–10.5)
nRBC: 0.3 % — ABNORMAL HIGH (ref 0.0–0.2)

## 2023-11-29 LAB — PREPARE RBC (CROSSMATCH)

## 2023-11-29 LAB — HEMOGLOBIN AND HEMATOCRIT, BLOOD
HCT: 31.2 % — ABNORMAL LOW (ref 36.0–46.0)
Hemoglobin: 10 g/dL — ABNORMAL LOW (ref 12.0–15.0)

## 2023-11-29 MED ORDER — SODIUM CHLORIDE 0.9% IV SOLUTION: Freq: Once | INTRAVENOUS | Status: AC

## 2023-11-29 NOTE — Progress Notes (Signed)
 PT Cancellation Note  Patient Details Name: Sherri  [name] MRN: [id number] DOB: 1940-06-29   Cancelled Treatment:    Reason Eval/Treat Not Completed: Medical issues which prohibited therapy (Pt with critical HgB of 6.6. Will hold PT evaluation.)  Randall SAUNDERS, PT, DPT Acute Rehabilitation Services Office: [phone] Secure Chat Preferred  Sherri [name] 11/29/2023, 8:20 AM

## 2023-11-29 NOTE — Evaluation (Signed)
 Clinical/Bedside Swallow Evaluation Patient Details  Name: Sherri [name] MRN: [id number] Date of Birth: 18-Jun-1940  Today's Date: 11/29/2023 Time: SLP Start Time (ACUTE ONLY): 1531 SLP Stop Time (ACUTE ONLY): 1549 SLP Time Calculation (min) (ACUTE ONLY): 18 min  Past Medical History:  Past Medical History:  Diagnosis Date   Anxiety    takes Valium  daily as needed   Basal cell carcinoma 01/21/1988   left nostril (MOHS), sup-right calf (CX35FU)   Basal cell carcinoma 03/22/1996   right calf (CX35FU)   Basal cell carcinoma 03/31/1995   left wing nose   Bruises easily    d/t meds   Cancer (HCC)    basal cell ca, in situ- uterine    Cataracts, bilateral    removed bilateral   Chronic back pain    Dizziness    r/t to meds   GERD (gastroesophageal reflux disease)    no meds on a regular basis but will take Tums if needed   Headache(784.0)    r/t neck issues   History of bronchitis 6-12yrs ago   History of colon polyps    benign   HOH (hard of hearing)    wears hearing aids   Hyperlipidemia    takes Atorvastatin  on Mondays and Fridays   Hypertension    Joint pain    Joint swelling    Multiple sclerosis (HCC)    Neuromuscular disorder (HCC)    Dr. FABIENE Crete- [facility], follows [facility].   Nocturia    PONV (postoperative nausea and vomiting)    trouble urinating after surgery in 2014   Postoperative nausea and vomiting 01/11/2019   Rheumatoid arthritis (HCC)    Dr Lorrayne weekly, RA- hands- knees- feet    Rheumatoid arthritis(714.0)    Dr Dolphus takes Xeljanz  daily   Right wrist fracture    Skin cancer    Squamous cell carcinoma of skin 11/02/2001   in situ-right knee (cx36fu)   Squamous cell carcinoma of skin 11/12/2011   in situ-left forearm (CX35FU), in situ-left foot (CX35FU)   Squamous cell carcinoma of skin 01/22/2012   in situ-left lower forearm (txpbx)   Squamous cell carcinoma of skin 11/17/2012   right shin (txpbx)   Squamous cell  carcinoma of skin 10/28/2013   in situ-right shoulder (CX35FU)   Squamous cell carcinoma of skin 04/28/2014   well diff-right shin (txpbx)   Squamous cell carcinoma of skin 11/02/2014   in situ-Left shin (txpbx)   Squamous cell carcinoma of skin 12/15/2014   in situ-Left hand (txpbx), bowens-left side chest (txpbx)   Squamous cell carcinoma of skin 05/09/2015   in situ-left hand (txpbx)    Squamous cell carcinoma of skin 05/07/2016   in situ-left inner shin,ant, in situ-left inner shin, post, in situ-left outer forearm, in situ-right knuckle   Squamous cell carcinoma of skin 03/1302018   in situ-left shoulder (txpbx), in situ-right inner shin (txpbx), in situ-top of left foot (txpbx), KA- left forearm (txpbx)   Squamous cell carcinoma of skin 12/02/2016   in situ-left inner shin (txpbx)   Squamous cell carcinoma of skin 03/04/2017   in situ-left outer sup, shin (txpbx), in situ- right 2nd knuckle finger (txpbx), in situ- Left wrist (txpbx)   Squamous cell carcinoma of skin 04/06/2018   in situ-above left knee inner (txpbx)   Squamous cell carcinoma of skin 04/21/2018   in situ-right top hand (txpbx)   Squamous cell carcinoma of skin 07/08/2018   in situ-right lower inner shin (txpbx)  Squamous cell carcinoma of skin 04/27/2019   in situ- Left neck(CX35FU), In situ- right neck (CX35FU)   Urinary retention    sees Dr.Wrenn about 2 times a yr   Past Surgical History:  Past Surgical History:  Procedure Laterality Date   ABDOMINAL HYSTERECTOMY     1985   ANTERIOR CERVICAL DECOMP/DISCECTOMY FUSION N/A 04/23/2022   Procedure: Anterior Cervical Decompression/Discectomy Fusion - Cervical Seven-Thoracic One;  Surgeon: Louis Shove, MD;  Location: [facility];  Service: Neurosurgery;  Laterality: N/A;  3C   APPENDECTOMY     with TAH   BACK SURGERY     several   BREAST BIOPSY Right    Results were negative   BROW LIFT Bilateral 10/02/2016   Procedure: BILATERAL LOWER LID BLEPHAROPLASTY;   Surgeon: Laurie Loyd Redhead, MD;  Location: [facility] OR;  Service: Plastics;  Laterality: Bilateral;   CARPAL TUNNEL RELEASE Right    cataracts     CERVICAL FUSION     Dr Leeann   CERVICAL FUSION  12/31/2012   Dr Leeann   CERVICAL FUSION  04/2022   CHEST TUBE INSERTION     for traumatic Pneumothorax   COLONOSCOPY  07/16/2010   normal    COLONOSCOPY W/ POLYPECTOMY  1997   negative since; Dr Debrah   ESOPHAGOGASTRODUODENOSCOPY  07/16/2010   normal   eye lid raise     EYE SURGERY Bilateral    cataracts removed - /w IOL   HARDWARE REMOVAL  08/2021   HIP CLOSED REDUCTION Right 11/16/2023   Procedure: CLOSED MANIPULATION, JOINT, HIP;  Surgeon: Ernie Cough, MD;  Location: [facility];  Service: Orthopedics;  Laterality: Right;   INCISION AND DRAINAGE OF WOUND Right 11/28/2023   Procedure: IRRIGATION AND DEBRIDEMENT WOUND; REMOVAL OF HARDWARE;  Surgeon: Edna Toribio LABOR, MD;  Location: [facility];  Service: Orthopedics;  Laterality: Right;  I & D RIGHT ELBOW   JOINT REPLACEMENT     LUMBAR FUSION     Dr Leeann   NASAL SINUS SURGERY     ORIF ELBOW FRACTURE Right 11/20/2023   Procedure: OPEN REDUCTION INTERNAL FIXATION (ORIF) ELBOW/OLECRANON FRACTURE;  Surgeon: [name] Franky SQUIBB, MD;  Location: [facility];  Service: Orthopedics;  Laterality: Right;   POSTERIOR CERVICAL FUSION/FORAMINOTOMY N/A 12/31/2012   Procedure: POSTERIOR LATERAL CERVICAL FUSION/FORAMINOTOMY LEVEL 1 CERVICAL THREE-FOUR WITH LATERAL MASS SCREWS;  Surgeon: Catalina CHRISTELLA Leeann, MD;  Location: [facility];  Service: Neurosurgery;  Laterality: N/A;   PTOSIS REPAIR Bilateral 10/02/2016   Procedure: INTERNAL PTOSIS REPAIR;  Surgeon: Laurie Loyd Redhead, MD;  Location: [facility];  Service: Plastics;  Laterality: Bilateral;   SKIN BIOPSY     THYROID  SURGERY     R lobe removed- 1972, has grown back - CT last done- 2018   TOTAL HIP ARTHROPLASTY  12/22/2011   Procedure: TOTAL HIP ARTHROPLASTY;  Surgeon: Dempsey JINNY Sensor, MD;  Location: [facility];  Service:  Orthopedics;  Laterality: Left;   TOTAL HIP ARTHROPLASTY Right 11/12/2023   Procedure: ARTHROPLASTY, HIP, TOTAL, ANTERIOR APPROACH;  Surgeon: Fidel Rogue, MD;  Location: [facility];  Service: Orthopedics;  Laterality: Right;   TOTAL SHOULDER ARTHROPLASTY     TUBAL LIGATION     UPPER GASTROINTESTINAL ENDOSCOPY  2012   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   WRIST SURGERY Left 09/16/2022   wrist/arm   HPI:  Pt is an 83 y.o. female who presented to the ED 7/4 due to concerns for exposed surgical hardware from previous elbow surgery in June.  SLP consulted due to RN's observance of coughing with liquids. PMH: hypertension, rheumatoid arthritis, chronic pain, urinary retention.    Assessment / Plan / Recommendation  Clinical Impression  Pt was seen for bedside swallow evaluation. She reported intake of regular texture solids and thin liquids as well as frequent coughing and globus sensation (identified the sternal notch) with solids. Pt stated that she has been symptomatic for years, but that it is getting worse. Mention of swallowing difficulty was noted in pt's EMR in 2015. Oral mechanism exam was [facility] and her natural dentition was adequate. She exhibited symptoms of pharyngeal dysphagia characterized by report of sticking with solids and throat clearing with liquids. Pt's current diet of regular texture solids and thin liquids may be continued with observance of swallowing precautions. A modified barium swallow study is recommended to further evaluate swallow function. SLP Visit Diagnosis: Dysphagia, unspecified (R13.10)    Aspiration Risk  Mild aspiration risk    Diet Recommendation Regular;Thin liquid    Liquid Administration via: Cup;Straw Medication Administration: Whole meds with puree Supervision: Patient able to self feed;Intermittent supervision to cue for compensatory strategies Compensations: Slow rate;Small sips/bites;Follow solids with liquid Postural Changes: Seated upright at 90  degrees    Other  Recommendations Oral Care Recommendations: Oral care BID     Assistance Recommended at Discharge    Functional Status Assessment Patient has had a recent decline in their functional status and demonstrates the ability to make significant improvements in function in a reasonable and predictable amount of time.  Frequency and Duration min 2x/week  2 weeks       Prognosis        Swallow Study   General Date of Onset: 05/01/23 HPI: Pt is an 83 y.o. female who presented to the ED 7/4 due to concerns for exposed surgical hardware from previous elbow surgery in June. SLP consulted due to RN's observance of coughing with liquids. PMH: hypertension, rheumatoid arthritis, chronic pain, urinary retention. Type of Study: Bedside Swallow Evaluation Previous Swallow Assessment: none Diet Prior to this Study: Regular;Thin liquids (Level 0) Temperature Spikes Noted: No Respiratory Status: Room air History of Recent Intubation: No Behavior/Cognition: Alert;Cooperative;Pleasant mood Oral Cavity Assessment: Within Functional Limits Oral Care Completed by SLP: Recent completion by staff Oral Cavity - Dentition: Adequate natural dentition Vision: Functional for self-feeding Self-Feeding Abilities: Able to feed self Baseline Vocal Quality: Hoarse Volitional Cough: Strong Volitional Swallow: Able to elicit    Oral/Motor/Sensory Function Overall Oral Motor/Sensory Function: Within functional limits   Ice Chips Ice chips: Impaired Presentation: Spoon Pharyngeal Phase Impairments: Throat Clearing - Immediate   Thin Liquid Thin Liquid: Impaired Presentation: Cup;Straw Pharyngeal  Phase Impairments: Throat Clearing - Immediate;Throat Clearing - Delayed    Nectar Thick Nectar Thick Liquid: Not tested   Honey Thick Honey Thick Liquid: Not tested   Puree Puree: Within functional limits Presentation: Spoon   Solid     Solid: Impaired Presentation: Self Fed Pharyngeal Phase  Impairments:  (sensation of pharyngeal residue)     Lillyrose Reitan I. [place], MS, CCC-SLP Acute Rehabilitation Services Office number [phone]  Thea LILLETTE Orlando 11/29/2023,4:22 PM

## 2023-11-29 NOTE — Progress Notes (Addendum)
     Subjective: Patient reports pain in the elbow is minimal.  Denies distal numbness and tingling.  Discussed plan for immobilization with a splint postoperatively.  However, okay to use her right arm to use walker. mostly bothered by catheter today which she states feels like it is pulling.  This was placed what appears to be on admission.  Objective:   VITALS:   Vitals:   11/28/23 1630 11/28/23 1653 11/28/23 2307 11/29/23 0500  BP: (!) 148/68 112/66 (!) 99/47 115/61  Pulse: 81 84 91 94  Resp: 11 16    Temp: 97.8 F (36.6 C) 97.9 F (36.6 C) 97.8 F (36.6 C) 97.9 F (36.6 C)  TempSrc:   Oral   SpO2: 95% 98% 96% 97%  Weight:      Height:       Sensation intact distally Intact pulses distally Compartment soft Splint clean, dry, and intact  Lab Results  Component Value Date   WBC 9.5 11/27/2023   HGB 8.1 (L) 11/27/2023   HCT 24.7 (L) 11/27/2023   MCV 102.1 (H) 11/27/2023   PLT 354 11/27/2023   BMET    Component Value Date/Time   NA 133 (L) 11/29/2023 0502   NA 139 06/20/2019 1335   NA 136 02/22/2015 0000   K 4.7 11/29/2023 0502   K 4.4 02/22/2015 0000   CL 102 11/29/2023 0502   CL 98 02/22/2015 0000   CO2 24 11/29/2023 0502   CO2 30 02/22/2015 0000   GLUCOSE 132 (H) 11/29/2023 0502   BUN 5 (L) 11/29/2023 0502   BUN 10 06/20/2019 1335   CREATININE 0.53 11/29/2023 0502   CREATININE 1.03 (H) 08/06/2023 1510   CALCIUM  7.6 (L) 11/29/2023 0502   CALCIUM  9.6 02/22/2015 0000   EGFR 55 (L) 08/06/2023 1510   GFRNONAA >60 11/29/2023 0502   GFRNONAA 56 (L) 09/26/2020 1536   Xray: Postop x-rays show interval removal of hardware with resection of olecranon fragment  Assessment/Plan: 1 Day Post-Op   Principal Problem:   Wound dehiscence Active Problems:   Hyperlipidemia   Multiple sclerosis (HCC)   Essential hypertension   Rheumatoid arthritis (HCC)   Hyponatremia   Macrocytic anemia   Cellulitis of right elbow  Status post right elbow hardware removal  with olecranon excision and triceps advancement 11/28/2023  Post op recs: WB: Immobilized in a long-arm splint, okay to use a walker for weightbearing with the right upper extremity Abx: ancef  periop and ceftriaxone  post op then dc on keflex  Imaging: PACU xrays DVT prophylaxis: Aspirin  81mg  daily for 4 weeks Follow up: 10-14 days after surgery for a wound check with Dr. Edna at [facility].  Address: [address], [place], [place] [address]  Office Phone: [phone]   TORIBIO DELENA EDNA 11/29/2023, 7:29 AM   TORIBIO Edna, MD  Contact information:   [phone] 7am-5pm epic message Dr. Edna, or call office for patient follow up: [phone] After hours and holidays please check [email or web address] for group call information for [facility]

## 2023-11-29 NOTE — Progress Notes (Signed)
 [facility] PROGRESS NOTE    Progress Note  Sherri [name]  FMW:[id number] DOB: September 01, 1940 DOA: 11/26/2023 PCP: [name] PARAS, MD     Brief Narrative:   Sherri [name] is an 83 y.o. 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 Assessment/Plan:   Right elbow visible hardware with Wound dehiscence with cellulitis Orthopedic surgery to recommend further antibiotics. Status post I&D of the right elbow on 11/27/2023 and hardware removal Blood cultures ordered on 11/27/2023. Continue IV Vanco and Rocephin .  Will transition to oral Keflex  upon discharge. Orthopedic surgery recommended aspirin  for 4 weeks. Follow-up with orthopedic surgery in 2 weeks  Hypovolemic hyponatremia: Sodium is improving continue IV fluids recheck in the morning.  Hypokalemia: Replete IV recheck in the morning  Macrocytic anemia/acute blood loss anemia B12 500 folate of 18, hemoglobin this morning 6.8. Likely due to postoperatively, transfused 2 units of packed red blood cells recheck a BC in the morning.  Rheumatoid arthritis: Continue methotrexate  and prednisone . Will have low threshold to give her stress dose steroids in the setting of possible infection.  History of multiple sclerosis: She is no longer on these medication.  Chronic pain: Continue Cymbalta .  Urinary retention: Continue oxybutynin .  History of recent right hip surgery in May 2025: She will need skilled nursing facility.  Goals of care: Palliative care to meet with family     DVT prophylaxis: lovenox  Family Communication:none Status is: Observation The patient will require  care spanning > 2 midnights and should be moved to inpatient because: Right elbow wound dehiscence with visible hardware and possible cellulitis    Code Status:     Code Status Orders  (From admission, onward)           Start     Ordered   11/27/23 0348  Full code  Continuous       Question:  By:  Answer:  Consent: discussion documented in EHR   11/27/23 0347           Code Status History     Date Active Date Inactive Code Status Order ID Comments User Context   11/10/2023 2225 11/20/2023 2039 Full Code [id number]  Dena Charleston, MD Inpatient   10/03/2023 0011 10/09/2023 1603 Full Code [id number]  Tobie Jorie SAUNDERS, MD ED   04/23/2022 1128 04/24/2022 1948 Full Code [id number]  Louis Shove, MD Inpatient   09/20/2021 2106 09/21/2021 1508 Full Code [id number]  Louis Shove, MD Inpatient   08/19/2021 1149 08/20/2021 1526 Full Code [id number]  Louis Shove, MD Inpatient         IV Access:   Peripheral IV   Procedures and diagnostic studies:   DG Elbow 2 Views Right Result Date: 11/28/2023 CLINICAL DATA:  Postop hardware removal EXAM: RIGHT ELBOW - 2 VIEW COMPARISON:  11/27/2023 FINDINGS: Interval removal of the previously seen proximal ulnar screw as well as the olecranon process fracture fragment. No additional acute bony abnormality. IMPRESSION: Postoperative changes as above. Electronically Signed   By: Franky Crease M.D.   On: 11/28/2023 16:22     Medical Consultants:   None.   Subjective:    Sherri [name] pain is controlled.  Objective:    Vitals:   11/28/23 1630 11/28/23  1653 11/28/23 2307 11/29/23 0500  BP: (!) 148/68 112/66 (!) 99/47 115/61  Pulse: 81 84 91 94  Resp: 11 16    Temp: 97.8 F (36.6 C) 97.9 F (36.6 C) 97.8 F (36.6 C) 97.9 F (36.6 C)  TempSrc:   Oral   SpO2: 95% 98% 96% 97%  Weight:      Height:       SpO2: 97 %   Intake/Output Summary (Last 24 hours) at 11/29/2023 0811 Last data filed at 11/29/2023 0355 Gross per 24 hour  Intake  2013.02 ml  Output 920 ml  Net 1093.02 ml   Filed Weights   11/26/23 2012 11/28/23 1238  Weight: 48.6 kg 48.6 kg    Exam: General exam: In no acute distress. Respiratory system: Good air movement and clear to auscultation. Cardiovascular system: S1 & S2 heard, RRR. No JVD. Gastrointestinal system: Abdomen is nondistended, soft and nontender.  Extremities: Right arm dressing in place Skin: No rashes, lesions or ulcers Psychiatry: Judgement and insight appear normal. Mood & affect appropriate.  Data Reviewed:    Labs: Basic Metabolic Panel: Recent Labs  Lab 11/27/23 0015 11/27/23 0539 11/28/23 0419 11/29/23 0502  NA 129* 131* 133* 133*  K 3.6 3.3* 3.4* 4.7  CL 94* 95* 99 102  CO2 24 29 27 24   GLUCOSE 103* 80 85 132*  BUN 10 9 7* 5*  CREATININE 0.68 0.60 0.65 0.53  CALCIUM  7.8* 7.6* 7.6* 7.6*   GFR Estimated Creatinine Clearance: 41.6 mL/min (by C-G formula based on SCr of 0.53 mg/dL). Liver Function Tests: No results for input(s): AST, ALT, ALKPHOS, BILITOT, PROT, ALBUMIN  in the last 168 hours. No results for input(s): LIPASE, AMYLASE in the last 168 hours. No results for input(s): AMMONIA in the last 168 hours. Coagulation profile No results for input(s): INR, PROTIME in the last 168 hours. COVID-19 Labs  Recent Labs    11/27/23 0827  FERRITIN 267    No results found for: SARSCOV2NAA  CBC: Recent Labs  Lab 11/27/23 0015 11/27/23 0539 11/29/23 0648  WBC 11.8* 9.5 13.1*  NEUTROABS  --  7.7  --   HGB 8.1* 8.1* 6.6*  HCT 25.5* 24.7* 20.8*  MCV 107.1* 102.1* 107.8*  PLT 351 354 322   Cardiac Enzymes: No results for input(s): CKTOTAL, CKMB, CKMBINDEX, TROPONINI in the last 168 hours. BNP (last 3 results) No results for input(s): PROBNP in the last 8760 hours. CBG: No results for input(s): GLUCAP in the last 168 hours.  D-Dimer: No results for input(s): DDIMER in the last 72 hours. Hgb A1c: No results for  input(s): HGBA1C in the last 72 hours. Lipid Profile: No results for input(s): CHOL, HDL, LDLCALC, TRIG, CHOLHDL, LDLDIRECT in the last 72 hours. Thyroid  function studies: Recent Labs    11/27/23 0827  TSH 3.196   Anemia work up: Recent Labs    11/27/23 0827  VITAMINB12 497  FOLATE 17.9  FERRITIN 267  TIBC 199*  IRON 38  RETICCTPCT 5.9*   Sepsis Labs: Recent Labs  Lab 11/27/23 0015 11/27/23 0539 11/29/23 0648  WBC 11.8* 9.5 13.1*   Microbiology Recent Results (from the past 240 hours)  Culture, blood (Routine X 2) w Reflex to ID Panel     Status: None (Preliminary result)   Collection Time: 11/27/23  8:26 AM   Specimen: BLOOD RIGHT HAND  Result Value Ref Range Status   Specimen Description BLOOD RIGHT HAND  Final   Special Requests   Final  BOTTLES DRAWN AEROBIC ONLY Blood Culture results may not be optimal due to an inadequate volume of blood received in culture bottles   Culture   Final    NO GROWTH 2 DAYS Performed at [facility], [address], [place], [place] [address]    Report Status PENDING  Incomplete  Culture, blood (Routine X 2) w Reflex to ID Panel     Status: None (Preliminary result)   Collection Time: 11/27/23  8:27 AM   Specimen: BLOOD LEFT ARM  Result Value Ref Range Status   Specimen Description BLOOD LEFT ARM  Final   Special Requests   Final    BOTTLES DRAWN AEROBIC AND ANAEROBIC Blood Culture adequate volume   Culture   Final    NO GROWTH 2 DAYS Performed at [facility], [address], [place], [place] [address]    Report Status PENDING  Incomplete     Medications:    sodium chloride    Intravenous Once   aspirin  EC  81 mg Oral BID   atorvastatin   10 mg Oral Daily   carvedilol   3.125 mg Oral BID WC   Chlorhexidine  Gluconate Cloth  6 each Topical Daily   cyclobenzaprine   10 mg Oral QHS   docusate sodium   100 mg Oral Daily   DULoxetine   20 mg Oral QHS   folic acid   1 mg Oral Daily   pantoprazole   40  mg Oral BID AC   polyethylene glycol  17 g Oral BID   predniSONE   5 mg Oral Q breakfast   senna  1 tablet Oral BID   Continuous Infusions:  0.9 % NaCl with KCl 40 mEq / L 75 mL/hr at 11/29/23 0355   cefTRIAXone  (ROCEPHIN )  IV Stopped (11/28/23 0847)      LOS: 2 days   Erle Odell Castor  [facility]  11/29/2023, 8:11 AM

## 2023-11-30 ENCOUNTER — Inpatient Hospital Stay (HOSPITAL_COMMUNITY)

## 2023-11-30 ENCOUNTER — Other Ambulatory Visit: Payer: Self-pay

## 2023-11-30 ENCOUNTER — Encounter (HOSPITAL_COMMUNITY)
Admission: EM | Disposition: A | Discharge status: Rehabilitation Facility | Payer: Self-pay | Source: Home / Self Care | Attending: Family Medicine

## 2023-11-30 ENCOUNTER — Encounter (HOSPITAL_COMMUNITY): Payer: Self-pay | Admitting: Internal Medicine

## 2023-11-30 DIAGNOSIS — E871 Hypo-osmolality and hyponatremia: Secondary | ICD-10-CM | POA: Diagnosis not present

## 2023-11-30 DIAGNOSIS — Z87891 Personal history of nicotine dependence: Secondary | ICD-10-CM | POA: Diagnosis not present

## 2023-11-30 DIAGNOSIS — I10 Essential (primary) hypertension: Secondary | ICD-10-CM

## 2023-11-30 DIAGNOSIS — T8130XA Disruption of wound, unspecified, initial encounter: Secondary | ICD-10-CM | POA: Diagnosis not present

## 2023-11-30 DIAGNOSIS — E785 Hyperlipidemia, unspecified: Secondary | ICD-10-CM | POA: Diagnosis not present

## 2023-11-30 DIAGNOSIS — L03113 Cellulitis of right upper limb: Secondary | ICD-10-CM | POA: Diagnosis not present

## 2023-11-30 DIAGNOSIS — T84020A Dislocation of internal right hip prosthesis, initial encounter: Secondary | ICD-10-CM | POA: Diagnosis not present

## 2023-11-30 HISTORY — PX: HIP CLOSED REDUCTION: SHX983

## 2023-11-30 LAB — BASIC METABOLIC PANEL WITH GFR
Anion gap: 9 (ref 5–15)
BUN: <5 mg/dL — ABNORMAL LOW (ref 8–23)
CO2: 25 mmol/L (ref 22–32)
Calcium: 8 mg/dL — ABNORMAL LOW (ref 8.9–10.3)
Chloride: 101 mmol/L (ref 98–111)
Creatinine, Ser: 0.62 mg/dL (ref 0.44–1.00)
GFR, Estimated: >60 mL/min (ref >60)
Glucose, Bld: 69 mg/dL — ABNORMAL LOW (ref 70–99)
Potassium: 3.7 mmol/L (ref 3.5–5.1)
Sodium: 135 mmol/L (ref 135–145)

## 2023-11-30 LAB — TYPE AND SCREEN
ABO/RH(D): A POS
Antibody Screen: NEGATIVE
Unit division: 0
Unit division: 0

## 2023-11-30 LAB — BPAM RBC
Blood Product Expiration Date: 202508012359
Blood Product Expiration Date: 202508022359
ISSUE DATE / TIME: 202507061059
ISSUE DATE / TIME: 202507061445
Unit Type and Rh: 6200
Unit Type and Rh: 6200

## 2023-11-30 LAB — CBC WITH DIFFERENTIAL/PLATELET
Abs Immature Granulocytes: 0.22 K/uL — ABNORMAL HIGH (ref 0.00–0.07)
Basophils Absolute: 0 K/uL (ref 0.0–0.1)
Basophils Relative: 0 %
Eosinophils Absolute: 0 K/uL (ref 0.0–0.5)
Eosinophils Relative: 0 %
HCT: 33.2 % — ABNORMAL LOW (ref 36.0–46.0)
Hemoglobin: 10.8 g/dL — ABNORMAL LOW (ref 12.0–15.0)
Immature Granulocytes: 2 %
Lymphocytes Relative: 4 %
Lymphs Abs: 0.4 K/uL — ABNORMAL LOW (ref 0.7–4.0)
MCH: 32.3 pg (ref 26.0–34.0)
MCHC: 32.5 g/dL (ref 30.0–36.0)
MCV: 99.4 fL (ref 80.0–100.0)
Monocytes Absolute: 0.6 K/uL (ref 0.1–1.0)
Monocytes Relative: 7 %
Neutro Abs: 7.8 K/uL — ABNORMAL HIGH (ref 1.7–7.7)
Neutrophils Relative %: 87 %
Platelets: 309 K/uL (ref 150–400)
RBC: 3.34 MIL/uL — ABNORMAL LOW (ref 3.87–5.11)
RDW: 19.4 % — ABNORMAL HIGH (ref 11.5–15.5)
WBC: 9.1 K/uL (ref 4.0–10.5)
nRBC: 0.4 % — ABNORMAL HIGH (ref 0.0–0.2)

## 2023-11-30 SURGERY — CLOSED REDUCTION, HIP
Anesthesia: General | Site: Hip

## 2023-11-30 MED ORDER — PHENYLEPHRINE 80 MCG/ML (10ML) SYRINGE FOR IV PUSH (FOR BLOOD PRESSURE SUPPORT)
PREFILLED_SYRINGE | INTRAVENOUS | Status: DC | PRN
  Administered 2023-11-30: 160 ug via INTRAVENOUS

## 2023-11-30 MED ORDER — DOXYCYCLINE HYCLATE 100 MG PO TABS: 100.0000 mg | ORAL_TABLET | Freq: Two times a day (BID) | ORAL | Status: DC

## 2023-11-30 MED ORDER — CHLORHEXIDINE GLUCONATE 0.12 % MT SOLN: 15.0000 mL | Freq: Once | OROMUCOSAL | Status: AC

## 2023-11-30 MED ORDER — POVIDONE-IODINE 10 % EX SWAB
2.0000 | Freq: Once | CUTANEOUS | Status: AC
  Administered 2023-11-30: 2 via TOPICAL

## 2023-11-30 MED ORDER — FENTANYL CITRATE (PF) 100 MCG/2ML IJ SOLN: 25.0000 ug | INTRAMUSCULAR | Status: DC | PRN

## 2023-11-30 MED ORDER — LIDOCAINE 2% (20 MG/ML) 5 ML SYRINGE
INTRAMUSCULAR | Status: AC
  Filled 2023-11-30: qty 5

## 2023-11-30 MED ORDER — SUCCINYLCHOLINE CHLORIDE 200 MG/10ML IV SOSY
PREFILLED_SYRINGE | INTRAVENOUS | Status: DC | PRN
  Administered 2023-11-30 (×2): 100 mg via INTRAVENOUS

## 2023-11-30 MED ORDER — OXYCODONE HCL 5 MG PO TABS: 5.0000 mg | ORAL_TABLET | Freq: Once | ORAL | Status: DC | PRN

## 2023-11-30 MED ORDER — CEFUROXIME AXETIL 250 MG PO TABS
500.0000 mg | ORAL_TABLET | Freq: Two times a day (BID) | ORAL | Status: DC
  Administered 2023-12-01 – 2023-12-05 (×8): 500 mg via ORAL
  Filled 2023-11-30 (×11): qty 2

## 2023-11-30 MED ORDER — LIDOCAINE 2% (20 MG/ML) 5 ML SYRINGE
INTRAMUSCULAR | Status: DC | PRN
  Administered 2023-11-30: 40 mg via INTRAVENOUS

## 2023-11-30 MED ORDER — ORAL CARE MOUTH RINSE: 15.0000 mL | Freq: Once | OROMUCOSAL | Status: AC

## 2023-11-30 MED ORDER — PROPOFOL 10 MG/ML IV BOLUS
INTRAVENOUS | Status: DC | PRN
  Administered 2023-11-30: 100 mg via INTRAVENOUS
  Administered 2023-11-30: 50 mg via INTRAVENOUS
  Administered 2023-11-30: 30 mg via INTRAVENOUS

## 2023-11-30 MED ORDER — DOXYCYCLINE HYCLATE 100 MG PO TABS
100.0000 mg | ORAL_TABLET | Freq: Two times a day (BID) | ORAL | Status: DC
  Administered 2023-11-30 – 2023-12-05 (×11): 100 mg via ORAL
  Filled 2023-11-30 (×11): qty 1

## 2023-11-30 MED ORDER — AMISULPRIDE (ANTIEMETIC) 5 MG/2ML IV SOLN: 10.0000 mg | Freq: Once | INTRAVENOUS | Status: DC | PRN

## 2023-11-30 MED ORDER — CLEVIDIPINE BUTYRATE 0.5 MG/ML IV EMUL
INTRAVENOUS | Status: AC
  Filled 2023-11-30: qty 50

## 2023-11-30 MED ORDER — PROPOFOL 10 MG/ML IV BOLUS
INTRAVENOUS | Status: AC
  Filled 2023-11-30: qty 20

## 2023-11-30 MED ORDER — CHLORHEXIDINE GLUCONATE 4 % EX SOLN: 60.0000 mL | Freq: Once | CUTANEOUS | Status: DC

## 2023-11-30 MED ORDER — MUPIROCIN 2 % EX OINT: 1.0000 | TOPICAL_OINTMENT | Freq: Two times a day (BID) | CUTANEOUS | 0 refills | Status: DC

## 2023-11-30 MED ORDER — CHLORHEXIDINE GLUCONATE 0.12 % MT SOLN
OROMUCOSAL | Status: AC
  Administered 2023-11-30: 15 mL via OROMUCOSAL
  Filled 2023-11-30: qty 15

## 2023-11-30 MED ORDER — OXYCODONE HCL 5 MG/5ML PO SOLN: 5.0000 mg | Freq: Once | ORAL | Status: DC | PRN

## 2023-11-30 MED ORDER — CHLORHEXIDINE GLUCONATE 4 % EX SOLN: 1.0000 | CUTANEOUS | 1 refills | Status: DC

## 2023-11-30 MED ORDER — LACTATED RINGERS IV SOLN: INTRAVENOUS | Status: DC

## 2023-11-30 MED ORDER — ONDANSETRON HCL 4 MG/2ML IJ SOLN
INTRAMUSCULAR | Status: DC | PRN
  Administered 2023-11-30: 4 mg via INTRAVENOUS

## 2023-11-30 SURGICAL SUPPLY — 1 items: DRESSING MEPILEX FLEX 4X4 (GAUZE/BANDAGES/DRESSINGS) IMPLANT

## 2023-11-30 NOTE — Plan of Care (Signed)
  Problem: Health Behavior/Discharge Planning: Goal: Ability to manage health-related needs will improve Outcome: Progressing   Problem: Coping: Goal: Level of anxiety will decrease Outcome: Progressing   

## 2023-11-30 NOTE — Progress Notes (Signed)
 PT Cancellation Note  Patient Details Name: Sherri  PERCILLA [name] MRN: [id number] DOB: [birth year]   Cancelled Treatment:    Reason Eval/Treat Not Completed: Patient not medically ready. Per chart review, R hip is redislocated. Will hold this date and continue to follow for readiness to participate in PT evaluation.    [name] 11/30/2023, 12:30 PM  [name], PT, DPT Acute Rehabilitation Services Secure Chat Preferred Office: [phone]

## 2023-11-30 NOTE — Progress Notes (Signed)
 Modified Barium Swallow Study  Patient Details  Name: Sherri [name] MRN: [id number] Date of Birth: [birth year]  Today's Date: 11/30/2023  Modified Barium Swallow completed.  Full report located under Chart Review in the Imaging Section.  History of Present Illness Pt is an 83 y.o. female who presented to the ED 7/4 due to concerns for exposed surgical hardware from previous elbow surgery in June. SLP consulted due to RN's observance of coughing with liquids. PMH: hypertension, rheumatoid arthritis, chronic pain, urinary retention.   Clinical Impression Pt demonstrates a moderate to severe oropharyngeal dysphagia with a DIGEST score of 4 due to chronic and gross aspiration of thin liquids and poor efficiency of swallowing with greater than 25% of bolus residue remaining in valleculae with solids. Primary problem is decreased epiglottic deflection and decreased PES opening, both appearing due to structural changes of pharynx after ACDF. Pt has good laryngeal elevation and closure with initial swallow, but residue in valleculae and above PES is penetrated post swallow and ultimately aspirated with insufficient sensation. Attempted head turns, chin tuck, supraglottic swallow with thin, none of which prevented aspiration. A chin tuck increased epiglottic deflection, but worsened PES opening and severity of aspiration. Head turn did not improve efficiency with solids. AP view showed symmetrical residue. Pt did show potential to improve epiglottic deflection, base of tongue retraction and ultimately bolus propulsion with effort and strengthing exercises to this area may be of benefit. Pt was able to better manage mildly thick/nectar liquids with improved airway proection and time for multiple swallows. Will need to discuss pts wishes. This dysphagia is chronic, but pt is deconditioned at this time (recent hip fx and arm surgery) and has lost weight possibly due to dysphagia. Will advise nectar thick liquids,  IDDSI 5 solids, multiple swallow and coughing, RMST, water  protocol and BOT and pharyngeal stregthening in next session. Factors that may increase risk of adverse event in presence of aspiration Noe & Lianne 2021):    Swallow Evaluation Recommendations Recommendations: PO diet;Free water  protocol after oral care PO Diet Recommendation: Dysphagia 2 (Finely chopped);Mildly thick liquids (Level 2, nectar thick) Liquid Administration via: Cup;Straw Medication Administration: Whole meds with liquid Supervision: Patient able to self-feed Swallowing strategies  : Slow rate;Small bites/sips;effortful swallow;Multiple dry swallows after each bite/sip;Follow solids with liquids;Clear throat intermittently Postural changes: Position pt fully upright for meals Oral care recommendations: Oral care before ice chips/water ;Oral care before PO      Terran Klinke, Consuelo Fitch 11/30/2023,10:34 AM

## 2023-11-30 NOTE — Progress Notes (Signed)
 [facility] PROGRESS NOTE    Progress Note  Sherri [name]  FMW:[id number] DOB: Nov 30, 1940 DOA: 11/26/2023 PCP: [name] PARAS, MD     Brief Narrative:   Sherri [name] is an 83 y.o. 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 Assessment/Plan:   Right elbow visible hardware with Wound dehiscence with cellulitis Orthopedic surgery to recommend further antibiotics. Status post I&D of the right elbow on 11/27/2023 and hardware removal Blood cultures ordered on 11/27/2023. Now on IV ceftin  and doxycycline . Orthopedic surgery recommended aspirin  for 4 weeks. Follow-up with orthopedic surgery in 2 weeks PT eval is pending.  Hypovolemic hyponatremia: Sodium is improving continue IV fluids recheck in the morning.  Hypokalemia: Replete IV recheck in the morning  Macrocytic anemia/acute blood loss anemia B12 500 folate of 18, hemoglobin this morning 6.8. Likely due to postoperatively, transfused 2 units of packed red blood cells recheck a BC in the morning.  Rheumatoid arthritis: Continue methotrexate  and prednisone . Will have low threshold to give her stress dose steroids in the setting of possible infection.  History of multiple sclerosis: She is no longer on these medication.  Chronic pain: Continue Cymbalta .  Urinary retention: Continue oxybutynin .  History of recent right hip surgery in May 2025: She will need skilled nursing facility.  Goals of care: Palliative care to meet with family     DVT prophylaxis: lovenox  Family Communication:none Status is: Observation The patient will require care spanning > 2  midnights and should be moved to inpatient because: Right elbow wound dehiscence with visible hardware and possible cellulitis    Code Status:     Code Status Orders  (From admission, onward)           Start     Ordered   11/27/23 0348  Full code  Continuous       Question:  By:  Answer:  Consent: discussion documented in EHR   11/27/23 0347           Code Status History     Date Active Date Inactive Code Status Order ID Comments User Context   11/10/2023 2225 11/20/2023 2039 Full Code [id number]  Dena Charleston, MD Inpatient   10/03/2023 0011 10/09/2023 1603 Full Code [id number]  Tobie Jorie SAUNDERS, MD ED   04/23/2022 1128 04/24/2022 1948 Full Code [id number]  Louis Shove, MD Inpatient   09/20/2021 2106 09/21/2021 1508 Full Code [id number]  Louis Shove, MD Inpatient   08/19/2021 1149 08/20/2021 1526 Full Code [id number]  Louis Shove, MD Inpatient         IV Access:   Peripheral IV   Procedures and diagnostic studies:   DG Elbow 2 Views Right Result Date: 11/28/2023 CLINICAL DATA:  Postop hardware removal EXAM: RIGHT ELBOW - 2 VIEW COMPARISON:  11/27/2023 FINDINGS: Interval removal of the previously seen proximal ulnar screw as well as the olecranon process fracture fragment. No additional acute bony abnormality. IMPRESSION: Postoperative changes as above. Electronically Signed   By: Franky Crease M.D.   On: 11/28/2023 16:22     Medical Consultants:   None.   Subjective:    Sherri [name] no complains  Objective:    Vitals:   11/29/23 1712 11/29/23 1957 11/30/23 0555 11/30/23  0807  BP: 130/67 (!) 108/53 (!) 159/76 (!) 142/72  Pulse:  89 93 99  Resp: 18 16 18 18   Temp: 98.4 F (36.9 C) 98.2 F (36.8 C) 98.2 F (36.8 C) 98.2 F (36.8 C)  TempSrc: Oral Oral Oral   SpO2: 100% 96% 96% 95%  Weight:      Height:       SpO2: 95 %   Intake/Output Summary (Last 24 hours) at 11/30/2023 1004 Last data filed at 11/30/2023 0842 Gross per 24 hour  Intake 706.33 ml   Output 1600 ml  Net -893.67 ml   Filed Weights   11/26/23 2012 11/28/23 1238  Weight: 48.6 kg 48.6 kg    Exam: General exam: In no acute distress. Respiratory system: Good air movement and clear to auscultation. Cardiovascular system: S1 & S2 heard, RRR. No JVD. Gastrointestinal system: Abdomen is nondistended, soft and nontender.  Extremities: No pedal edema. Skin: No rashes, lesions or ulcers Psychiatry: Judgement and insight appear normal. Mood & affect appropriate.  Data Reviewed:    Labs: Basic Metabolic Panel: Recent Labs  Lab 11/27/23 0015 11/27/23 0539 11/28/23 0419 11/29/23 0502 11/30/23 0508  NA 129* 131* 133* 133* 135  K 3.6 3.3* 3.4* 4.7 3.7  CL 94* 95* 99 102 101  CO2 24 29 27 24 25   GLUCOSE 103* 80 85 132* 69*  BUN 10 9 7* 5* <5*  CREATININE 0.68 0.60 0.65 0.53 0.62  CALCIUM  7.8* 7.6* 7.6* 7.6* 8.0*   GFR Estimated Creatinine Clearance: 41.6 mL/min (by C-G formula based on SCr of 0.62 mg/dL). Liver Function Tests: No results for input(s): AST, ALT, ALKPHOS, BILITOT, PROT, ALBUMIN  in the last 168 hours. No results for input(s): LIPASE, AMYLASE in the last 168 hours. No results for input(s): AMMONIA in the last 168 hours. Coagulation profile No results for input(s): INR, PROTIME in the last 168 hours. COVID-19 Labs  No results for input(s): DDIMER, FERRITIN, LDH, CRP in the last 72 hours.   No results found for: SARSCOV2NAA  CBC: Recent Labs  Lab 11/27/23 0015 11/27/23 0539 11/29/23 0648 11/29/23 2048  WBC 11.8* 9.5 13.1*  --   NEUTROABS  --  7.7  --   --   HGB 8.1* 8.1* 6.6* 10.0*  HCT 25.5* 24.7* 20.8* 31.2*  MCV 107.1* 102.1* 107.8*  --   PLT 351 354 322  --    Cardiac Enzymes: No results for input(s): CKTOTAL, CKMB, CKMBINDEX, TROPONINI in the last 168 hours. BNP (last 3 results) No results for input(s): PROBNP in the last 8760 hours. CBG: No results for input(s): GLUCAP in the last  168 hours.  D-Dimer: No results for input(s): DDIMER in the last 72 hours. Hgb A1c: No results for input(s): HGBA1C in the last 72 hours. Lipid Profile: No results for input(s): CHOL, HDL, LDLCALC, TRIG, CHOLHDL, LDLDIRECT in the last 72 hours. Thyroid  function studies: No results for input(s): TSH, T4TOTAL, T3FREE, THYROIDAB in the last 72 hours.  Invalid input(s): FREET3  Anemia work up: No results for input(s): VITAMINB12, FOLATE, FERRITIN, TIBC, IRON, RETICCTPCT in the last 72 hours.  Sepsis Labs: Recent Labs  Lab 11/27/23 0015 11/27/23 0539 11/29/23 0648  WBC 11.8* 9.5 13.1*   Microbiology Recent Results (from the past 240 hours)  Culture, blood (Routine X 2) w Reflex to ID Panel     Status: None (Preliminary result)   Collection Time: 11/27/23  8:26 AM   Specimen: BLOOD RIGHT HAND  Result Value Ref Range Status  Specimen Description BLOOD RIGHT HAND  Final   Special Requests   Final    BOTTLES DRAWN AEROBIC ONLY Blood Culture results may not be optimal due to an inadequate volume of blood received in culture bottles   Culture   Final    NO GROWTH 3 DAYS Performed at [facility], [address] [place], [place] [address]    Report Status PENDING  Incomplete  Culture, blood (Routine X 2) w Reflex to ID Panel     Status: None (Preliminary result)   Collection Time: 11/27/23  8:27 AM   Specimen: BLOOD LEFT ARM  Result Value Ref Range Status   Specimen Description BLOOD LEFT ARM  Final   Special Requests   Final    BOTTLES DRAWN AEROBIC AND ANAEROBIC Blood Culture adequate volume   Culture   Final    NO GROWTH 3 DAYS Performed at [facility], [address], [place], [place] [address]    Report Status PENDING  Incomplete     Medications:    aspirin  EC  81 mg Oral BID   atorvastatin   10 mg Oral Daily   carvedilol   3.125 mg Oral BID WC   Chlorhexidine  Gluconate Cloth  6 each Topical Daily   cyclobenzaprine    10 mg Oral QHS   docusate sodium   100 mg Oral Daily   DULoxetine   20 mg Oral QHS   folic acid   1 mg Oral Daily   pantoprazole   40 mg Oral BID AC   predniSONE   5 mg Oral Q breakfast   senna  1 tablet Oral BID   Continuous Infusions:  cefTRIAXone  (ROCEPHIN )  IV 1 g (11/30/23 0817)      LOS: 3 days   Erle Odell Castor  [facility]  11/30/2023, 10:04 AM

## 2023-11-30 NOTE — Plan of Care (Signed)

## 2023-11-30 NOTE — Progress Notes (Signed)
     Subjective: Patient reports pain as mild about the right elbow.  Pain in the right hip.  She is in the hip abduction brace.  Worked with physical therapy yesterday due to the hemoglobin.  She does not want to fully extend her right leg but appears that it may be shorter than her left today.  Objective:   VITALS:   Vitals:   11/29/23 1712 11/29/23 1957 11/30/23 0555 11/30/23 0807  BP: 130/67 (!) 108/53 (!) 159/76 (!) 142/72  Pulse:  89 93 99  Resp: 18 16 18 18   Temp: 98.4 F (36.9 C) 98.2 F (36.8 C) 98.2 F (36.8 C) 98.2 F (36.8 C)  TempSrc: Oral Oral Oral   SpO2: 100% 96% 96% 95%  Weight:      Height:        Sensation intact distally Intact pulses distally Compartment soft Splint clean, dry, and intact  Lab Results  Component Value Date   WBC 13.1 (H) 11/29/2023   HGB 10.0 (L) 11/29/2023   HCT 31.2 (L) 11/29/2023   MCV 107.8 (H) 11/29/2023   PLT 322 11/29/2023   BMET    Component Value Date/Time   NA 135 11/30/2023 0508   NA 139 06/20/2019 1335   NA 136 02/22/2015 0000   K 3.7 11/30/2023 0508   K 4.4 02/22/2015 0000   CL 101 11/30/2023 0508   CL 98 02/22/2015 0000   CO2 25 11/30/2023 0508   CO2 30 02/22/2015 0000   GLUCOSE 69 (L) 11/30/2023 0508   BUN <5 (L) 11/30/2023 0508   BUN 10 06/20/2019 1335   CREATININE 0.62 11/30/2023 0508   CREATININE 1.03 (H) 08/06/2023 1510   CALCIUM  8.0 (L) 11/30/2023 0508   CALCIUM  9.6 02/22/2015 0000   EGFR 55 (L) 08/06/2023 1510   GFRNONAA >60 11/30/2023 0508   GFRNONAA 56 (L) 09/26/2020 1536    Xray: Postop x-rays show interval removal of hardware with resection of olecranon fragment   Assessment/Plan: 2 Days Post-Op   Principal Problem:   Wound dehiscence Active Problems:   Hyperlipidemia   Multiple sclerosis (HCC)   Essential hypertension   Rheumatoid arthritis (HCC)   Hyponatremia   Macrocytic anemia   Cellulitis of right elbow   Status post right elbow hardware removal with olecranon excision  and triceps advancement 11/28/2023  Concerns this morning given increasing right hip.  He has close placement done by Dr. Fidel and subsequent close reduction.  Will obtain another pelvis x-ray to make sure hip is still reduced.   Post op recs: WB: Immobilized in a long-arm splint, okay to use a walker for weightbearing with the right upper extremity Abx: ancef  periop and ceftriaxone  post op then dc on keflex  Imaging: PACU xrays DVT prophylaxis: Aspirin  81mg  daily for 4 weeks Follow up: 10-14 days after surgery for a wound check with Dr. Edna at [facility].  Address: [address], [place], [place] [address]  Office Phone: [phone]    TORIBIO DELENA EDNA 11/30/2023, 8:28 AM   TORIBIO Edna, MD  Contact information:   [phone] 7am-5pm epic message Dr. Edna, or call office for patient follow up: [phone] After hours and holidays please check [email or web address] for group call information for [facility]

## 2023-11-30 NOTE — Transfer of Care (Signed)
 Immediate Anesthesia Transfer of Care Note  Patient: Sherri  [name]  Procedure(s) Performed: CLOSED REDUCTION, HIP (Hip)  Patient Location: PACU  Anesthesia Type:General  Level of Consciousness: awake and alert   Airway & Oxygen Therapy: Patient Spontanous Breathing and Patient connected to face mask oxygen  Post-op Assessment: Report given to RN and Post -op Vital signs reviewed and stable  Post vital signs: Reviewed and stable  Last Vitals:  Vitals Value Taken Time  BP 152/84 11/30/23 19:37  Temp 37.1 C 11/30/23 19:37  Pulse 91 11/30/23 19:43  Resp 9 11/30/23 19:43  SpO2 96 % 11/30/23 19:43  Vitals shown include unfiled device data.  Last Pain:  Vitals:   11/30/23 1937  TempSrc:   PainSc: Asleep      Patients Stated Pain Goal: 2 (11/29/23 1719)  Complications: No notable events documented.

## 2023-11-30 NOTE — Consult Note (Signed)
 ORTHOPAEDIC CONSULTATION  REQUESTING PHYSICIAN: Odell Celinda Balo, MD  PCP:  [name] PARAS, MD  Chief Complaint: ppx right hip dislocation  HPI: Sherri [name] is a 83 y.o. female who underwent R THA for chronic displaced femoral neck fracture on 11/12/23.She dislocated without any known event on 6/22 and underwent closed reduction on 6/23.  This morning, she was found to have shortened LE and x-ray confirmed dislocation.  Past Medical History:  Diagnosis Date   Anxiety    takes Valium  daily as needed   Basal cell carcinoma 01/21/1988   left nostril (MOHS), sup-right calf (CX35FU)   Basal cell carcinoma 03/22/1996   right calf (CX35FU)   Basal cell carcinoma 03/31/1995   left wing nose   Bruises easily    d/t meds   Cancer (HCC)    basal cell ca, in situ- uterine    Cataracts, bilateral    removed bilateral   Chronic back pain    Dizziness    r/t to meds   GERD (gastroesophageal reflux disease)    no meds on a regular basis but will take Tums if needed   Headache(784.0)    r/t neck issues   History of bronchitis 6-56yrs ago   History of colon polyps    benign   HOH (hard of hearing)    wears hearing aids   Hyperlipidemia    takes Atorvastatin  on Mondays and Fridays   Hypertension    Joint pain    Joint swelling    Multiple sclerosis (HCC)    Neuromuscular disorder (HCC)    Dr. FABIENE Crete- [facility], follows [facility]   Nocturia    PONV (postoperative nausea and vomiting)    trouble urinating after surgery in 2014   Postoperative nausea and vomiting 01/11/2019   Rheumatoid arthritis (HCC)    Dr Lorrayne weekly, RA- hands- knees- feet    Rheumatoid arthritis(714.0)    Dr Dolphus takes Xeljanz  daily   Right wrist fracture    Skin cancer    Squamous cell carcinoma of skin 11/02/2001   in situ-right knee (cx52fu)   Squamous cell carcinoma of skin 11/12/2011   in situ-left forearm (CX35FU), in situ-left foot (CX35FU)   Squamous cell  carcinoma of skin 01/22/2012   in situ-left lower forearm (txpbx)   Squamous cell carcinoma of skin 11/17/2012   right shin (txpbx)   Squamous cell carcinoma of skin 10/28/2013   in situ-right shoulder (CX35FU)   Squamous cell carcinoma of skin 04/28/2014   well diff-right shin (txpbx)   Squamous cell carcinoma of skin 11/02/2014   in situ-Left shin (txpbx)   Squamous cell carcinoma of skin 12/15/2014   in situ-Left hand (txpbx), bowens-left side chest (txpbx)   Squamous cell carcinoma of skin 05/09/2015   in situ-left hand (txpbx)    Squamous cell carcinoma of skin 05/07/2016   in situ-left inner shin,ant, in situ-left inner shin, post, in situ-left outer forearm, in situ-right knuckle   Squamous cell carcinoma of skin 03/1302018   in situ-left shoulder (txpbx), in situ-right inner shin (txpbx), in situ-top of left foot (txpbx), KA- left forearm (txpbx)   Squamous cell carcinoma of skin 12/02/2016   in situ-left inner shin (txpbx)   Squamous cell carcinoma of skin 03/04/2017   in situ-left outer sup, shin (txpbx), in situ- right 2nd knuckle finger (txpbx), in situ- Left wrist (txpbx)   Squamous cell carcinoma of skin 04/06/2018   in situ-above left knee inner (txpbx)   Squamous cell  carcinoma of skin 04/21/2018   in situ-right top hand (txpbx)   Squamous cell carcinoma of skin 07/08/2018   in situ-right lower inner shin (txpbx)   Squamous cell carcinoma of skin 04/27/2019   in situ- Left neck(CX35FU), In situ- right neck (CX35FU)   Urinary retention    sees Dr.Wrenn about 2 times a yr   Past Surgical History:  Procedure Laterality Date   ABDOMINAL HYSTERECTOMY     1985   ANTERIOR CERVICAL DECOMP/DISCECTOMY FUSION N/A 04/23/2022   Procedure: Anterior Cervical Decompression/Discectomy Fusion - Cervical Seven-Thoracic One;  Surgeon: Louis Shove, MD;  Location: [facility];  Service: Neurosurgery;  Laterality: N/A;  3C   APPENDECTOMY     with TAH   BACK SURGERY     several   BREAST  BIOPSY Right    Results were negative   BROW LIFT Bilateral 10/02/2016   Procedure: BILATERAL LOWER LID BLEPHAROPLASTY;  Surgeon: Laurie Loyd Redhead, MD;  Location: [facility];  Service: Plastics;  Laterality: Bilateral;   CARPAL TUNNEL RELEASE Right    cataracts     CERVICAL FUSION     Dr Leeann   CERVICAL FUSION  12/31/2012   Dr Leeann   CERVICAL FUSION  04/2022   CHEST TUBE INSERTION     for traumatic Pneumothorax   COLONOSCOPY  07/16/2010   normal    COLONOSCOPY W/ POLYPECTOMY  1997   negative since; Dr Debrah   ESOPHAGOGASTRODUODENOSCOPY  07/16/2010   normal   eye lid raise     EYE SURGERY Bilateral    cataracts removed - /w IOL   HARDWARE REMOVAL  08/2021   HIP CLOSED REDUCTION Right 11/16/2023   Procedure: CLOSED MANIPULATION, JOINT, HIP;  Surgeon: Ernie Cough, MD;  Location: [facility];  Service: Orthopedics;  Laterality: Right;   INCISION AND DRAINAGE OF WOUND Right 11/28/2023   Procedure: IRRIGATION AND DEBRIDEMENT WOUND; REMOVAL OF HARDWARE;  Surgeon: Edna Toribio LABOR, MD;  Location: [facility];  Service: Orthopedics;  Laterality: Right;  I & D RIGHT ELBOW   JOINT REPLACEMENT     LUMBAR FUSION     Dr Leeann   NASAL SINUS SURGERY     ORIF ELBOW FRACTURE Right 11/20/2023   Procedure: OPEN REDUCTION INTERNAL FIXATION (ORIF) ELBOW/OLECRANON FRACTURE;  Surgeon: [name] Franky SQUIBB, MD;  Location: [facility];  Service: Orthopedics;  Laterality: Right;   POSTERIOR CERVICAL FUSION/FORAMINOTOMY N/A 12/31/2012   Procedure: POSTERIOR LATERAL CERVICAL FUSION/FORAMINOTOMY LEVEL 1 CERVICAL THREE-FOUR WITH LATERAL MASS SCREWS;  Surgeon: Catalina CHRISTELLA Leeann, MD;  Location: [facility];  Service: Neurosurgery;  Laterality: N/A;   PTOSIS REPAIR Bilateral 10/02/2016   Procedure: INTERNAL PTOSIS REPAIR;  Surgeon: Laurie Loyd Redhead, MD;  Location: [facility];  Service: Plastics;  Laterality: Bilateral;   SKIN BIOPSY     THYROID  SURGERY     R lobe removed- 1972, has grown back - CT last done- 2018   TOTAL HIP  ARTHROPLASTY  12/22/2011   Procedure: TOTAL HIP ARTHROPLASTY;  Surgeon: Dempsey JINNY Sensor, MD;  Location: [facility];  Service: Orthopedics;  Laterality: Left;   TOTAL HIP ARTHROPLASTY Right 11/12/2023   Procedure: ARTHROPLASTY, HIP, TOTAL, ANTERIOR APPROACH;  Surgeon: Fidel Rogue, MD;  Location: [facility];  Service: Orthopedics;  Laterality: Right;   TOTAL SHOULDER ARTHROPLASTY     TUBAL LIGATION     UPPER GASTROINTESTINAL ENDOSCOPY  2012   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   WRIST SURGERY Left 09/16/2022   wrist/arm   Social History  Socioeconomic History   Marital status: Married    Spouse name: Cheryl   Number of children: 2   Years of education: Not on file   Highest education level: Not on file  Occupational History   Occupation: retired  Tobacco Use   Smoking status: Former    Current packs/day: 0.00    Average packs/day: 2.5 packs/day for 20.0 years (50.0 ttl pk-yrs)    Types: Cigarettes    Start date: 05/26/1961    Quit date: 05/26/1981    Years since quitting: 42.5    Passive exposure: Never   Smokeless tobacco: Never   Tobacco comments:    Smoked [phone], up to 2.5 ppd  Vaping Use   Vaping status: Never Used  Substance and Sexual Activity   Alcohol  use: Yes    Alcohol /week: 14.0 standard drinks of alcohol     Types: 14 Cans of beer per week    Comment: 2% beer   Drug use: Never   Sexual activity: Yes    Birth control/protection: Surgical    Comment: Hysterectomy  Other Topics Concern   Not on file  Social History Narrative   Not on file   Social Drivers of Health   Financial Resource Strain: Low Risk  (02/21/2022)   Overall Financial Resource Strain (CARDIA)    Difficulty of Paying Living Expenses: Not hard at all  Food Insecurity: Patient Declined (11/27/2023)   Hunger Vital Sign    Worried About Running Out of Food in the Last Year: Patient declined    Ran Out of Food in the Last Year: Patient declined  Transportation Needs: Patient Declined (11/27/2023)    PRAPARE - Administrator, Civil Service (Medical): Patient declined    Lack of Transportation (Non-Medical): Patient declined  Physical Activity: Inactive (02/21/2022)   Exercise Vital Sign    Days of Exercise per Week: 0 days    Minutes of Exercise per Session: 0 min  Stress: Stress Concern Present (02/21/2022)   [employer] of Occupational Health - Occupational Stress Questionnaire    Feeling of Stress : Rather much  Social Connections: Unknown (11/27/2023)   Social Connection and Isolation Panel    Frequency of Communication with Friends and Family: Three times a week    Frequency of Social Gatherings with Friends and Family: Three times a week    Attends Religious Services: Not on file    Active Member of Clubs or Organizations: Yes    Attends Club or Organization Meetings: 1 to 4 times per year    Marital Status: Married  Recent Concern: Social Connections - Moderately Isolated (10/03/2023)   Social Connection and Isolation Panel    Frequency of Communication with Friends and Family: More than three times a week    Frequency of Social Gatherings with Friends and Family: More than three times a week    Attends Religious Services: Never    Database administrator or Organizations: No    Attends Engineer, structural: Never    Marital Status: Married   Family History  Problem Relation Age of Onset   Cancer Mother        pancreatic   Diabetes Mother    Pancreatic cancer Mother    Heart disease Father        Rheumatic   Cancer Father        ? stomach   Stomach cancer Father    Cancer Sister        stomach   Stomach  cancer Sister    Cancer Maternal Aunt        X 4; ? primary   Heart disease Paternal Aunt    Cancer Maternal Grandmother        cervical   Colon cancer Other        Aunts   Multiple sclerosis Daughter    Hypertension Neg Hx    Stroke Neg Hx    Esophageal cancer Neg Hx    Rectal cancer Neg Hx    Allergies  Allergen Reactions    Darvon [Propoxyphene Hcl] Shortness Of Breath    ? Dose Related ? Lowered respirations greatly   Demerol [Meperidine] Shortness Of Breath and Other (See Comments)    Respiratory Distress   Penicillins Hives and Other (See Comments)    Has patient had a PCN reaction causing immediate rash, facial/tongue/throat swelling, SOB or lightheadedness with hypotension: No SEVERE RASH INVOLVING MUCUS MEMBRANES or SKIN NECROSIS: #  #  #  YES  #  #  #  Has patient had a PCN reaction that required hospitalization No Has patient had a PCN reaction occurring within the last 10 years: No.    Imuran [Azathioprine] Other (See Comments)    Weakness   Sulfa  Antibiotics Nausea Only and Other (See Comments)    States had severe indigestion and heartburn and nausea and had to stop taking   Arava [Leflunomide] Other (See Comments)    Excessive weight gain   Lipitor [Atorvastatin ] Nausea And Vomiting    Currently taking 2 times weekly - able to tolerate low dose    Vytorin [Ezetimibe-Simvastatin] Nausea And Vomiting   Prior to Admission medications   Medication Sig Start Date End Date Taking? Authorizing Provider  acetaminophen  (TYLENOL ) 325 MG tablet Take 2 tablets (650 mg total) by mouth every 6 (six) hours as needed for mild pain (pain score 1-3) or fever (or Fever >/= 101). 11/20/23  Yes Dorinda Drue DASEN, MD  aspirin  (ASPIRIN  CHILDRENS) 81 MG chewable tablet Chew 1 tablet (81 mg total) by mouth 2 (two) times daily with a meal. 11/13/23 12/28/23 Yes Hill, Valery RAMAN, PA-C  aspirin  EC 81 MG tablet Take 1 tablet (81 mg total) by mouth 2 (two) times daily for 28 days. Swallow whole. 11/29/23 12/27/23 Yes Renae Bernarda HERO, PA-C  atorvastatin  (LIPITOR) 10 MG tablet TAKE 1 TAB ON MONDAYS AND FRIDAY 02/26/23  Yes Burns, Glade PARAS, MD  Biotin  5000 MCG TABS Take 10,000 mcg by mouth in the morning.   Yes [provider]  Carboxymethylcellul-Glycerin  (LUBRICATING EYE DROPS OP) Place 1 drop into both eyes daily as needed (dry  eyes).   Yes [provider]  carvedilol  (COREG ) 3.125 MG tablet Take 1 tablet (3.125 mg total) by mouth 2 (two) times daily with a meal. 11/10/23  Yes Burns, Glade PARAS, MD  cephALEXin  (KEFLEX ) 500 MG capsule Take 500 mg by mouth 3 (three) times daily. For 10 days   Yes [provider]  chlorhexidine  (HIBICLENS ) 4 % external liquid Apply 15 mLs (1 Application total) topically as directed for 30 doses. Use as directed daily for 5 days every other week for 6 weeks. 11/12/23  Yes Hill, Valery RAMAN, PA-C  cholecalciferol  (VITAMIN D ) 25 MCG (1000 UNIT) tablet Take 1,000 Units by mouth in the morning.   Yes [provider]  cyclobenzaprine  (FLEXERIL ) 10 MG tablet TAKE 1 TABLET BY MOUTH THREE TIMES A DAY AS NEEDED FOR MUSCLE SPASM Patient taking differently: Take 10 mg by mouth at  bedtime. 06/11/23  Yes Burns, Glade PARAS, MD  denosumab  (PROLIA ) 60 MG/ML SOSY injection Inject 60 mg into the skin every 6 (six) months. Deliver to rheum: [address], [place], [place] [address]. Appt on 07/23/2022 07/17/22 11/26/24 Yes Cheryl Waddell HERO, PA-C  diazepam  (VALIUM ) 5 MG tablet TAKE 1 TABLET BY MOUTH AT BEDTIME AS NEEDED FOR MUSCLE SPASMS. Patient taking differently: Take 5 mg by mouth daily as needed for muscle spasms. 04/28/23  Yes Burns, Glade PARAS, MD  docusate sodium  (COLACE) 100 MG capsule Take 1 capsule (100 mg total) by mouth daily. 11/21/23  Yes Dorinda Drue DASEN, MD  DULoxetine  (CYMBALTA ) 20 MG capsule Take 20 mg by mouth at bedtime.   Yes [provider]  folic acid  (FOLVITE ) 1 MG tablet Take 1 tablet (1 mg total) by mouth daily. 09/02/23  Yes Cheryl Waddell HERO, PA-C  furosemide  (LASIX ) 20 MG tablet TAKE 1 TABLET BY MOUTH EVERY DAY 08/19/23  Yes Burns, Glade PARAS, MD  methotrexate  (RHEUMATREX) 2.5 MG tablet Take 5 tablets (12.5 mg total) by mouth once a week. Caution:Chemotherapy. Protect from light. 09/02/23  Yes Cheryl Waddell HERO, PA-C  mupirocin  ointment (BACTROBAN ) 2 % Place 1 Application into the  nose 2 (two) times daily for 60 doses. Use as directed 2 times daily for 5 days every other week for 6 weeks. 11/12/23 12/12/23 Yes Hill, Valery RAMAN, PA-C  oxybutynin  (DITROPAN ) 5 MG tablet Take 5 mg by mouth daily. 11/06/23  Yes [provider]  pantoprazole  (PROTONIX ) 40 MG tablet Take 1 tablet (40 mg total) by mouth 2 (two) times daily before a meal. 07/27/23  Yes Burns, Glade PARAS, MD  polyethylene glycol (MIRALAX  / GLYCOLAX ) 17 g packet Take 17 g by mouth daily as needed for mild constipation. 11/20/23  Yes Dorinda Drue DASEN, MD  predniSONE  (DELTASONE ) 5 MG tablet TAKE 1 TABLET BY MOUTH EVERY DAY IN THE MORNING 05/22/23  Yes Cheryl Waddell HERO, PA-C  senna (SENOKOT) 8.6 MG tablet Take 1 tablet by mouth 2 (two) times daily.   Yes [provider]  traMADol  (ULTRAM ) 50 MG tablet Take 25-50 mg by mouth every 8 (eight) hours as needed for moderate pain (pain score 4-6).   Yes [provider]  bisacodyl  (DULCOLAX) 10 MG suppository Place 1 suppository (10 mg total) rectally daily as needed for moderate constipation. 11/20/23   Dorinda Drue DASEN, MD  diclofenac sodium (VOLTAREN) 1 % GEL APPLY 3 GRAMS TO 3 LARGE JOINTS 3 TIMES A DAY AS NEEDED Patient not taking: Reported on 11/27/2023 01/27/18   Dolphus Reiter, MD  Upadacitinib  ER ([name] ) 15 MG TB24 Take 15 mg by mouth daily. Patient not taking: Reported on 11/27/2023    [provider]   DG Pelvis Portable Result Date: 11/30/2023 CLINICAL DATA:  [id number] Hip instability, right [id number] EXAM: PORTABLE PELVIS 1-2 VIEWS COMPARISON:  11/16/2023 FINDINGS: Please note the distal femoral stem is not included in the field of view. The femoral component of right hip arthroplasty remains superiorly dislocated with respect to the acetabular cup. There may be more superior migration than on prior exam. No definite fracture is seen. Overlying skin staples remain in place. Portions of left hip arthroplasty are visualized. IMPRESSION: Persistent superior  dislocation of the femoral component of right hip arthroplasty. Electronically Signed   By: Andrea Gasman M.D.   On: 11/30/2023 11:52   DG Swallowing Func-Speech Pathology Result Date: 11/30/2023 Table formatting from the original result was not included. Modified Barium Swallow  Study Patient Details Name: Sherri [name] MRN: [id number] Date of Birth: Feb 26, 1941 Today's Date: 11/30/2023 HPI/PMH: HPI: Pt is an 83 y.o. female who presented to the ED 7/4 due to concerns for exposed surgical hardware from previous elbow surgery in June. SLP consulted due to RN's observance of coughing with liquids. PMH: hypertension, rheumatoid arthritis, chronic pain, urinary retention. Clinical Impression: Clinical Impression: Pt demonstrates a moderate to severe oropharyngeal dysphagia with a DIGEST score of 4 due to chronic and gross aspiration of thin liquids and poor efficiency of swallowing with greater than 25% of bolus residue remaining in valleculae with solids. Primary problem is decreased epiglottic deflection and decreased PES opening, both appearing due to structural changes of pharynx after ACDF. Pt has good laryngeal elevation and closure with initial swallow, but residue in valleculae and above PES is penetrated post swallow and ultimately aspirated with insufficient sensation. Attempted head turns, chin tuck, supraglottic swallow with thin, none of which prevented aspiration. A chin tuck increased epiglottic deflection, but worsened PES opening and severity of aspiration. Head turn did not improve efficiency with solids. AP view showed symmetrical residue. Pt did show potential to improve epiglottic deflection, base of tongue retraction and ultimately bolus propulsion with effort and strengthing exercises to this area may be of benefit. Pt was able to better manage mildly thick/nectar liquids with improved airway proection and time for multiple swallows. Will need to discuss pts wishes. This dysphagia is chronic,  but pt is deconditioned at this time (recent hip fx and arm surgery) and has lost weight possibly due to dysphagia. Will advise nectar thick liquids, IDDSI 5 solids, multiple swallow and coughing, RMST, water  protocol and BOT and pharyngeal stregthening in next session. Factors that may increase risk of adverse event in presence of aspiration Noe & Lianne 2021): No data recorded Recommendations/Plan: Swallowing Evaluation Recommendations Swallowing Evaluation Recommendations Recommendations: PO diet; Free water  protocol after oral care PO Diet Recommendation: Dysphagia 2 (Finely chopped); Mildly thick liquids (Level 2, nectar thick) Liquid Administration via: Cup; Straw Medication Administration: Whole meds with liquid Supervision: Patient able to self-feed Swallowing strategies  : Slow rate; Small bites/sips; effortful swallow; Multiple dry swallows after each bite/sip; Follow solids with liquids; Clear throat intermittently Postural changes: Position pt fully upright for meals Oral care recommendations: Oral care before ice chips/water ; Oral care before PO Treatment Plan Treatment Plan Treatment recommendations: Therapy as outlined in treatment plan below Follow-up recommendations: Acute inpatient rehab (3 hours/day) Functional status assessment: Patient has had a recent decline in their functional status and demonstrates the ability to make significant improvements in function in a reasonable and predictable amount of time. Treatment frequency: Min 2x/week Treatment duration: 2 weeks Interventions: Aspiration precaution training; Oropharyngeal exercises; Compensatory techniques; Patient/family education; Diet toleration management by SLP; Respiratory muscle strength training Recommendations Recommendations for follow up therapy are one component of a multi-disciplinary discharge planning process, led by the attending physician.  Recommendations may be updated based on patient status, additional functional  criteria and insurance authorization. Assessment: Orofacial Exam: Orofacial Exam Oral Cavity: Oral Hygiene: WFL Oral Cavity - Dentition: Adequate natural dentition Anatomy: Anatomy: Presence of cervical hardware Boluses Administered: Boluses Administered Boluses Administered: Thin liquids (Level 0); Mildly thick liquids (Level 2, nectar thick); Puree; Solid  Oral Impairment Domain: Oral Impairment Domain Lip Closure: No labial escape Tongue control during bolus hold: Cohesive bolus between tongue to palatal seal Bolus preparation/mastication: Timely and efficient chewing and mashing Bolus transport/lingual motion: Brisk tongue motion Oral residue: Trace residue  lining oral structures Location of oral residue : Tongue Initiation of pharyngeal swallow : Posterior angle of the ramus  Pharyngeal Impairment Domain: Pharyngeal Impairment Domain Soft palate elevation: No bolus between soft palate (SP)/pharyngeal wall (PW) Laryngeal elevation: Complete superior movement of thyroid  cartilage with complete approximation of arytenoids to epiglottic petiole Anterior hyoid excursion: Complete anterior movement Epiglottic movement: Partial inversion Laryngeal vestibule closure: Complete, no air/contrast in laryngeal vestibule Pharyngeal stripping wave : Present - diminished Pharyngeal contraction (A/P view only): Complete Pharyngoesophageal segment opening: Partial distention/partial duration, partial obstruction of flow Tongue base retraction: Wide column of contrast or air between tongue base and PPW Pharyngeal residue: Majority of contrast within or on pharyngeal structures Location of pharyngeal residue: Valleculae; Pyriform sinuses; Tongue base  Esophageal Impairment Domain: No data recorded Pill: No data recorded Penetration/Aspiration Scale Score: Penetration/Aspiration Scale Score 1.  Material does not enter airway: Puree; Solid 2.  Material enters airway, remains ABOVE vocal cords then ejected out: Thin liquids (Level  0); Mildly thick liquids (Level 2, nectar thick) 4.  Material enters airway, CONTACTS cords then ejected out: Mildly thick liquids (Level 2, nectar thick) 5.  Material enters airway, CONTACTS cords and not ejected out: Thin liquids (Level 0) 7.  Material enters airway, passes BELOW cords and not ejected out despite cough attempt by patient: Thin liquids (Level 0) 8.  Material enters airway, passes BELOW cords without attempt by patient to eject out (silent aspiration) : Thin liquids (Level 0) Compensatory Strategies: Compensatory Strategies Compensatory strategies: Yes Straw: Ineffective Effortful swallow: Effective Effective Effortful Swallow: Puree; Mildly thick liquid (Level 2, nectar thick) Multiple swallows: Effective Effective Multiple Swallows: Mildly thick liquid (Level 2, nectar thick); Puree Chin tuck: Ineffective Ineffective Chin Tuck: Thin liquid (Level 0) Liquid wash: Effective Effective Liquid Wash: Mildly thick liquid (Level 2, nectar thick); Puree Left head turn: Ineffective Ineffective Left Head Turn: Mildly thick liquid (Level 2, nectar thick); Puree Right head turn: Ineffective Ineffective Right Head Turn: Mildly thick liquid (Level 2, nectar thick); Puree Supraglottic swallow: Ineffective Ineffective Supraglottic Swallow: Thin liquid (Level 0) Super supraglottic swallow: Ineffective Ineffective Super Supraglottic Swallow: Thin liquid (Level 0) Mendelsohn : Ineffective Ineffective Mendelsohn: Thin liquid (Level 0)   General Information: Caregiver present: No  Diet Prior to this Study: Regular; Thin liquids (Level 0)   Temperature : Normal   Respiratory Status: WFL   Supplemental O2: None (Room air)   History of Recent Intubation: No  Behavior/Cognition: Alert; Cooperative; Pleasant mood Self-Feeding Abilities: Able to self-feed Baseline vocal quality/speech: Dysphonic Volitional Cough: Able to elicit Volitional Swallow: Able to elicit No data recorded Goal Planning: Prognosis for improved  oropharyngeal function: Good No data recorded No data recorded Patient/Family Stated Goal: none stated No data recorded Pain: Pain Assessment Pain Assessment: No/denies pain End of Session: Start Time:SLP Start Time (ACUTE ONLY): 0834 Stop Time: SLP Stop Time (ACUTE ONLY): 0856 Time Calculation:SLP Time Calculation (min) (ACUTE ONLY): 22 min Charges: SLP Evaluations $ SLP Speech Visit: 1 Visit SLP Evaluations $BSS Swallow: 1 Procedure $MBS Swallow: 1 Procedure $Swallowing Treatment: 1 Procedure SLP visit diagnosis: SLP Visit Diagnosis: Dysphagia, unspecified (R13.10) Past Medical History: Past Medical History: Diagnosis Date  Anxiety   takes Valium  daily as needed  Basal cell carcinoma 01/21/1988  left nostril (MOHS), sup-right calf (CX35FU)  Basal cell carcinoma 03/22/1996  right calf (CX35FU)  Basal cell carcinoma 03/31/1995  left wing nose  Bruises easily   d/t meds  Cancer (HCC)   basal cell ca, in situ-  uterine   Cataracts, bilateral   removed bilateral  Chronic back pain   Dizziness   r/t to meds  GERD (gastroesophageal reflux disease)   no meds on a regular basis but will take Tums if needed  Headache(784.0)   r/t neck issues  History of bronchitis 6-35yrs ago  History of colon polyps   benign  HOH (hard of hearing)   wears hearing aids  Hyperlipidemia   takes Atorvastatin  on Mondays and Fridays  Hypertension   Joint pain   Joint swelling   Multiple sclerosis (HCC)   Neuromuscular disorder (HCC)   Dr. FABIENE Crete- [facility], follows [facility]  Nocturia   PONV (postoperative nausea and vomiting)   trouble urinating after surgery in 2014  Postoperative nausea and vomiting 01/11/2019  Rheumatoid arthritis (HCC)   Dr Lorrayne weekly, RA- hands- knees- feet   Rheumatoid arthritis(714.0)   Dr Dolphus takes Xeljanz  daily  Right wrist fracture   Skin cancer   Squamous cell carcinoma of skin 11/02/2001  in situ-right knee (cx12fu)  Squamous cell carcinoma of skin 11/12/2011  in situ-left forearm (CX35FU),  in situ-left foot (CX35FU)  Squamous cell carcinoma of skin 01/22/2012  in situ-left lower forearm (txpbx)  Squamous cell carcinoma of skin 11/17/2012  right shin (txpbx)  Squamous cell carcinoma of skin 10/28/2013  in situ-right shoulder (CX35FU)  Squamous cell carcinoma of skin 04/28/2014  well diff-right shin (txpbx)  Squamous cell carcinoma of skin 11/02/2014  in situ-Left shin (txpbx)  Squamous cell carcinoma of skin 12/15/2014  in situ-Left hand (txpbx), bowens-left side chest (txpbx)  Squamous cell carcinoma of skin 05/09/2015  in situ-left hand (txpbx)   Squamous cell carcinoma of skin 05/07/2016  in situ-left inner shin,ant, in situ-left inner shin, post, in situ-left outer forearm, in situ-right knuckle  Squamous cell carcinoma of skin 03/1302018  in situ-left shoulder (txpbx), in situ-right inner shin (txpbx), in situ-top of left foot (txpbx), KA- left forearm (txpbx)  Squamous cell carcinoma of skin 12/02/2016  in situ-left inner shin (txpbx)  Squamous cell carcinoma of skin 03/04/2017  in situ-left outer sup, shin (txpbx), in situ- right 2nd knuckle finger (txpbx), in situ- Left wrist (txpbx)  Squamous cell carcinoma of skin 04/06/2018  in situ-above left knee inner (txpbx)  Squamous cell carcinoma of skin 04/21/2018  in situ-right top hand (txpbx)  Squamous cell carcinoma of skin 07/08/2018  in situ-right lower inner shin (txpbx)  Squamous cell carcinoma of skin 04/27/2019  in situ- Left neck(CX35FU), In situ- right neck (CX35FU)  Urinary retention   sees Dr.Wrenn about 2 times a yr Past Surgical History: Past Surgical History: Procedure Laterality Date  ABDOMINAL HYSTERECTOMY    1985  ANTERIOR CERVICAL DECOMP/DISCECTOMY FUSION N/A 04/23/2022  Procedure: Anterior Cervical Decompression/Discectomy Fusion - Cervical Seven-Thoracic One;  Surgeon: Louis Shove, MD;  Location: [facility];  Service: Neurosurgery;  Laterality: N/A;  3C  APPENDECTOMY    with TAH  BACK SURGERY    several  BREAST BIOPSY Right    Results were negative  BROW LIFT Bilateral 10/02/2016  Procedure: BILATERAL LOWER LID BLEPHAROPLASTY;  Surgeon: Laurie Loyd Redhead, MD;  Location: [facility] OR;  Service: Plastics;  Laterality: Bilateral;  CARPAL TUNNEL RELEASE Right   cataracts    CERVICAL FUSION    Dr Leeann  CERVICAL FUSION  12/31/2012  Dr Leeann  CERVICAL FUSION  04/2022  CHEST TUBE INSERTION    for traumatic Pneumothorax  COLONOSCOPY  07/16/2010  normal   COLONOSCOPY W/ POLYPECTOMY  1997  negative since; Dr Debrah  ESOPHAGOGASTRODUODENOSCOPY  07/16/2010  normal  eye lid raise    EYE SURGERY Bilateral   cataracts removed - /w IOL  HARDWARE REMOVAL  08/2021  HIP CLOSED REDUCTION Right 11/16/2023  Procedure: CLOSED MANIPULATION, JOINT, HIP;  Surgeon: Ernie Cough, MD;  Location: [facility];  Service: Orthopedics;  Laterality: Right;  INCISION AND DRAINAGE OF WOUND Right 11/28/2023  Procedure: IRRIGATION AND DEBRIDEMENT WOUND; REMOVAL OF HARDWARE;  Surgeon: Edna Toribio LABOR, MD;  Location: [facility];  Service: Orthopedics;  Laterality: Right;  I & D RIGHT ELBOW  JOINT REPLACEMENT    LUMBAR FUSION    Dr Leeann  NASAL SINUS SURGERY    ORIF ELBOW FRACTURE Right 11/20/2023  Procedure: OPEN REDUCTION INTERNAL FIXATION (ORIF) ELBOW/OLECRANON FRACTURE;  Surgeon: [name] Franky SQUIBB, MD;  Location: [facility];  Service: Orthopedics;  Laterality: Right;  POSTERIOR CERVICAL FUSION/FORAMINOTOMY N/A 12/31/2012  Procedure: POSTERIOR LATERAL CERVICAL FUSION/FORAMINOTOMY LEVEL 1 CERVICAL THREE-FOUR WITH LATERAL MASS SCREWS;  Surgeon: Catalina CHRISTELLA Leeann, MD;  Location: [facility];  Service: Neurosurgery;  Laterality: N/A;  PTOSIS REPAIR Bilateral 10/02/2016  Procedure: INTERNAL PTOSIS REPAIR;  Surgeon: Laurie Loyd Redhead, MD;  Location: [facility];  Service: Plastics;  Laterality: Bilateral;  SKIN BIOPSY    THYROID  SURGERY    R lobe removed- 1972, has grown back - CT last done- 2018  TOTAL HIP ARTHROPLASTY  12/22/2011  Procedure: TOTAL HIP ARTHROPLASTY;  Surgeon: Dempsey JINNY Sensor, MD;   Location: [facility];  Service: Orthopedics;  Laterality: Left;  TOTAL HIP ARTHROPLASTY Right 11/12/2023  Procedure: ARTHROPLASTY, HIP, TOTAL, ANTERIOR APPROACH;  Surgeon: Fidel Rogue, MD;  Location: [facility];  Service: Orthopedics;  Laterality: Right;  TOTAL SHOULDER ARTHROPLASTY    TUBAL LIGATION    UPPER GASTROINTESTINAL ENDOSCOPY  2012  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  Physical Exam: General: Alert, no acute distress Cardiovascular: No pedal edema Respiratory: No cyanosis, no use of accessory musculature GI: No organomegaly, abdomen is soft and non-tender Skin: No lesions in the area of chief complaint Neurologic: Sensation intact distally Psychiatric: Patient is competent for consent with normal mood and affect Lymphatic: No axillary or cervical lymphadenopathy  MUSCULOSKELETAL: R hip dressing c/d/I. Shortened and rotated. Nvi.  Assessment: R periprosthetic hip dislocation x2.  Plan: This is an unfortunate situation, likely caused from weakness/decondition from prolonged nonambulatory status.  To OR for closed reduction. R/B/A discussed. If she dislocates a third time, then we will discuss conversion to constrained liner.    Rogue JINNY Fidel, MD [phone]    11/30/2023 6:24 PM

## 2023-11-30 NOTE — Op Note (Signed)
 OPERATIVE REPORT   11/30/2023  7:26 PM  PATIENT:  Sherri [name]   SURGEON:  Redell JINNY Shoals, MD  ASSISTANT:  Staff.   PREOPERATIVE DIAGNOSIS:  Periprosthetic right hip dislocation.  POSTOPERATIVE DIAGNOSIS:  Same.  PROCEDURE:  Closed reduction right hip.  ANESTHESIA:   GETA.  ANTIBIOTICS:  None.  IMPLANTS:  None.  SPECIMENS:  None.  COMPLICATIONS:  None.  DISPOSITION:  Stable to PACU.  SURGICAL INDICATIONS:  Sherri [name] is a 83 y.o. female who underwent primary right total hip arthroplasty for chronic displaced femoral neck fracture on 11/12/23.  She dislocated without any known event on 6/22 and underwent closed reduction on 6/23.  Due to complaint of persistent pain and lower extremity shortening, repeat x-ray was obtained today showing periprosthetic right hip dislocation.  She was indicated for closed reduction.  The risk, benefits, and alternatives were discussed, and patient/family elected to proceed.  PROCEDURE IN DETAIL: The patient was identified in the holding area as 2 identifiers.  The surgical site was marked by myself.  She was taken to the operating room, and placed supine on the operating room table.  General anesthesia was obtained using an LMA.  Paralytic was administered.  Antibiotics were not given, as none were indicated.  Using traction and rotation, I was unable to reduce the hip after several attempts.  Due to overall fragility, I elected to cease further attempts.  Patient was then awakened from anesthesia and transferred to the PACU in stable condition.  POSTOPERATIVE PLAN: Patient will be readmitted to the hospitalist.  The hip remains dislocated, so she will need to be on bedrest for now.  Discontinue abduction brace.  Will need to proceed with revision right hip arthroplasty later this week at [facility].  I have discussed all of this with her daughter, who agrees.

## 2023-11-30 NOTE — Anesthesia Postprocedure Evaluation (Signed)
 Anesthesia Post Note  Patient: Sherri [name]  Procedure(s) Performed: CLOSED REDUCTION, HIP (Hip)     Patient location during evaluation: PACU Anesthesia Type: General Level of consciousness: awake and alert, oriented and patient cooperative Pain management: pain level controlled Vital Signs Assessment: post-procedure vital signs reviewed and stable Respiratory status: spontaneous breathing, nonlabored ventilation and respiratory function stable Cardiovascular status: blood pressure returned to baseline and stable Postop Assessment: no apparent nausea or vomiting Anesthetic complications: no   No notable events documented.  Last Vitals:  Vitals:   11/30/23 2015 11/30/23 2034  BP: (!) 140/84 120/70  Pulse: 91 98  Resp: 10 14  Temp: 37.1 C 36.6 C  SpO2: 96% 99%    Last Pain:  Vitals:   11/30/23 2034  TempSrc: Oral  PainSc:                  Fredi Geiler,E. Azavier Creson

## 2023-11-30 NOTE — Anesthesia Preprocedure Evaluation (Addendum)
 Anesthesia Evaluation  Patient identified by MRN, date of birth, ID band Patient awake    Reviewed: Allergy & Precautions, NPO status , Patient's Chart, lab work & pertinent test results  History of Anesthesia Complications (+) PONV  Airway Mallampati: I  TM Distance: >3 FB Neck ROM: Full    Dental  (+) Caps, Dental Advisory Given   Pulmonary former smoker   breath sounds clear to auscultation       Cardiovascular hypertension (carvedilol ), Pt. on home beta blockers and Pt. on medications (-) angina  Rhythm:Regular Rate:Normal  HLD '18 ECHO:  - Left ventricle: The cavity size was normal. Systolic function was    normal. The estimated ejection fraction was in the range of 60%    to 65%. Wall motion was normal; there were no regional wall    motion abnormalities. Left ventricular diastolic function    parameters were normal.  - Aortic valve: Valve area (VTI): 2 cm^2. Valve area (Vmax): 2.17    cm^2. Valve area (Vmean): 1.86 cm^2.  - Mitral valve: There was mild regurgitation.  - Left atrium: The atrium was mildly dilated.  - Atrial septum: There was increased thickness of the septum,    consistent with lipomatous hypertrophy. No defect or patent    foramen ovale was identified.     Neuro/Psych  Headaches  Anxiety     Multiple sclerosis  H/o cervical fusion  Neuromuscular disease (right-sided sciatica, cervical radiculopathy and lumbar radiculopathy s/p fusions)    GI/Hepatic Neg liver ROS, PUD,GERD  Medicated and Controlled,,  Endo/Other  negative endocrine ROS    Renal/GU negative Renal ROS     Musculoskeletal  (+) Arthritis , Rheumatoid disorders,  Osteoporosis    Abdominal   Peds  Hematology  (+) Blood dyscrasia, anemia Lab Results      Component                Value               Date                      WBC                      9.1                 11/30/2023                HGB                      10.8  (L)            11/30/2023                HCT                      33.2 (L)            11/30/2023                MCV                      99.4                11/30/2023                PLT  309                 11/30/2023              Anesthesia Other Findings   Reproductive/Obstetrics                              Anesthesia Physical Anesthesia Plan  ASA: 3  Anesthesia Plan: General   Post-op Pain Management: Tylenol  PO (pre-op)*   Induction: Intravenous  PONV Risk Score and Plan: 4 or greater and Ondansetron , Dexamethasone  and TIVA  Airway Management Planned: Natural Airway and Mask  Additional Equipment: None  Intra-op Plan:   Post-operative Plan:   Informed Consent: I have reviewed the patients History and Physical, chart, labs and discussed the procedure including the risks, benefits and alternatives for the proposed anesthesia with the patient or authorized representative who has indicated his/her understanding and acceptance.     Dental advisory given  Plan Discussed with: CRNA and Surgeon  Anesthesia Plan Comments:          Anesthesia Quick Evaluation

## 2023-11-30 NOTE — Progress Notes (Signed)
 R hip xray unfortuantely shows that R hip is redislocated. Reached out to her surgeon Dr. Fidel and trauma team regarding timing for repeat closed reduction. Made her NPO for now.

## 2023-11-30 NOTE — Anesthesia Procedure Notes (Signed)
 Procedure Name: LMA Insertion Date/Time: 11/30/2023 6:59 PM  Performed by: Boyce Shilling, CRNAPre-anesthesia Checklist: Patient identified, Emergency Drugs available, Suction available, Timeout performed and Patient being monitored Patient Re-evaluated:Patient Re-evaluated prior to induction Oxygen Delivery Method: Circle system utilized Preoxygenation: Pre-oxygenation with 100% oxygen Induction Type: IV induction Ventilation: Mask ventilation without difficulty LMA: LMA inserted LMA Size: 4.0 Tube type: Oral Number of attempts: 1 Placement Confirmation: positive ETCO2, CO2 detector and breath sounds checked- equal and bilateral Tube secured with: Tape Dental Injury: Teeth and Oropharynx as per pre-operative assessment

## 2023-11-30 NOTE — Interval H&P Note (Signed)
 History and Physical Interval Note:  11/30/2023 6:30 PM  Sherri [name]  has presented today for surgery, with the diagnosis of Right Hip Dislocation.  The various methods of treatment have been discussed with the patient and family. After consideration of risks, benefits and other options for treatment, the patient has consented to  Procedure(s): CLOSED REDUCTION, HIP (N/A) as a surgical intervention.  The patient's history has been reviewed, patient examined, no change in status, stable for surgery.  I have reviewed the patient's chart and labs.  Questions were answered to the patient's satisfaction.     Redell PARAS Rajah Tagliaferro

## 2023-11-30 NOTE — H&P (View-Only) (Signed)
 ORTHOPAEDIC CONSULTATION  REQUESTING PHYSICIAN: Odell Celinda Balo, MD  PCP:  [name] PARAS, MD  Chief Complaint: ppx right hip dislocation  HPI: Sherri [name] is a 83 y.o. female who underwent R THA for chronic displaced femoral neck fracture on 11/12/23.She dislocated without any known event on 6/22 and underwent closed reduction on 6/23.  This morning, she was found to have shortened LE and x-ray confirmed dislocation.  Past Medical History:  Diagnosis Date   Anxiety    takes Valium  daily as needed   Basal cell carcinoma 01/21/1988   left nostril (MOHS), sup-right calf (CX35FU)   Basal cell carcinoma 03/22/1996   right calf (CX35FU)   Basal cell carcinoma 03/31/1995   left wing nose   Bruises easily    d/t meds   Cancer (HCC)    basal cell ca, in situ- uterine    Cataracts, bilateral    removed bilateral   Chronic back pain    Dizziness    r/t to meds   GERD (gastroesophageal reflux disease)    no meds on a regular basis but will take Tums if needed   Headache(784.0)    r/t neck issues   History of bronchitis 6-56yrs ago   History of colon polyps    benign   HOH (hard of hearing)    wears hearing aids   Hyperlipidemia    takes Atorvastatin  on Mondays and Fridays   Hypertension    Joint pain    Joint swelling    Multiple sclerosis (HCC)    Neuromuscular disorder (HCC)    Dr. FABIENE Crete- [facility], follows [facility]   Nocturia    PONV (postoperative nausea and vomiting)    trouble urinating after surgery in 2014   Postoperative nausea and vomiting 01/11/2019   Rheumatoid arthritis (HCC)    Dr Lorrayne weekly, RA- hands- knees- feet    Rheumatoid arthritis(714.0)    Dr Dolphus takes Xeljanz  daily   Right wrist fracture    Skin cancer    Squamous cell carcinoma of skin 11/02/2001   in situ-right knee (cx52fu)   Squamous cell carcinoma of skin 11/12/2011   in situ-left forearm (CX35FU), in situ-left foot (CX35FU)   Squamous cell  carcinoma of skin 01/22/2012   in situ-left lower forearm (txpbx)   Squamous cell carcinoma of skin 11/17/2012   right shin (txpbx)   Squamous cell carcinoma of skin 10/28/2013   in situ-right shoulder (CX35FU)   Squamous cell carcinoma of skin 04/28/2014   well diff-right shin (txpbx)   Squamous cell carcinoma of skin 11/02/2014   in situ-Left shin (txpbx)   Squamous cell carcinoma of skin 12/15/2014   in situ-Left hand (txpbx), bowens-left side chest (txpbx)   Squamous cell carcinoma of skin 05/09/2015   in situ-left hand (txpbx)    Squamous cell carcinoma of skin 05/07/2016   in situ-left inner shin,ant, in situ-left inner shin, post, in situ-left outer forearm, in situ-right knuckle   Squamous cell carcinoma of skin 03/1302018   in situ-left shoulder (txpbx), in situ-right inner shin (txpbx), in situ-top of left foot (txpbx), KA- left forearm (txpbx)   Squamous cell carcinoma of skin 12/02/2016   in situ-left inner shin (txpbx)   Squamous cell carcinoma of skin 03/04/2017   in situ-left outer sup, shin (txpbx), in situ- right 2nd knuckle finger (txpbx), in situ- Left wrist (txpbx)   Squamous cell carcinoma of skin 04/06/2018   in situ-above left knee inner (txpbx)   Squamous cell  carcinoma of skin 04/21/2018   in situ-right top hand (txpbx)   Squamous cell carcinoma of skin 07/08/2018   in situ-right lower inner shin (txpbx)   Squamous cell carcinoma of skin 04/27/2019   in situ- Left neck(CX35FU), In situ- right neck (CX35FU)   Urinary retention    sees Dr.Wrenn about 2 times a yr   Past Surgical History:  Procedure Laterality Date   ABDOMINAL HYSTERECTOMY     1985   ANTERIOR CERVICAL DECOMP/DISCECTOMY FUSION N/A 04/23/2022   Procedure: Anterior Cervical Decompression/Discectomy Fusion - Cervical Seven-Thoracic One;  Surgeon: Louis Shove, MD;  Location: [facility];  Service: Neurosurgery;  Laterality: N/A;  3C   APPENDECTOMY     with TAH   BACK SURGERY     several   BREAST  BIOPSY Right    Results were negative   BROW LIFT Bilateral 10/02/2016   Procedure: BILATERAL LOWER LID BLEPHAROPLASTY;  Surgeon: Laurie Loyd Redhead, MD;  Location: [facility];  Service: Plastics;  Laterality: Bilateral;   CARPAL TUNNEL RELEASE Right    cataracts     CERVICAL FUSION     Dr Leeann   CERVICAL FUSION  12/31/2012   Dr Leeann   CERVICAL FUSION  04/2022   CHEST TUBE INSERTION     for traumatic Pneumothorax   COLONOSCOPY  07/16/2010   normal    COLONOSCOPY W/ POLYPECTOMY  1997   negative since; Dr Debrah   ESOPHAGOGASTRODUODENOSCOPY  07/16/2010   normal   eye lid raise     EYE SURGERY Bilateral    cataracts removed - /w IOL   HARDWARE REMOVAL  08/2021   HIP CLOSED REDUCTION Right 11/16/2023   Procedure: CLOSED MANIPULATION, JOINT, HIP;  Surgeon: Ernie Cough, MD;  Location: [facility];  Service: Orthopedics;  Laterality: Right;   INCISION AND DRAINAGE OF WOUND Right 11/28/2023   Procedure: IRRIGATION AND DEBRIDEMENT WOUND; REMOVAL OF HARDWARE;  Surgeon: Edna Toribio LABOR, MD;  Location: [facility];  Service: Orthopedics;  Laterality: Right;  I & D RIGHT ELBOW   JOINT REPLACEMENT     LUMBAR FUSION     Dr Leeann   NASAL SINUS SURGERY     ORIF ELBOW FRACTURE Right 11/20/2023   Procedure: OPEN REDUCTION INTERNAL FIXATION (ORIF) ELBOW/OLECRANON FRACTURE;  Surgeon: [name] Franky SQUIBB, MD;  Location: [facility];  Service: Orthopedics;  Laterality: Right;   POSTERIOR CERVICAL FUSION/FORAMINOTOMY N/A 12/31/2012   Procedure: POSTERIOR LATERAL CERVICAL FUSION/FORAMINOTOMY LEVEL 1 CERVICAL THREE-FOUR WITH LATERAL MASS SCREWS;  Surgeon: Catalina CHRISTELLA Leeann, MD;  Location: [facility];  Service: Neurosurgery;  Laterality: N/A;   PTOSIS REPAIR Bilateral 10/02/2016   Procedure: INTERNAL PTOSIS REPAIR;  Surgeon: Laurie Loyd Redhead, MD;  Location: [facility];  Service: Plastics;  Laterality: Bilateral;   SKIN BIOPSY     THYROID  SURGERY     R lobe removed- 1972, has grown back - CT last done- 2018   TOTAL HIP  ARTHROPLASTY  12/22/2011   Procedure: TOTAL HIP ARTHROPLASTY;  Surgeon: Dempsey JINNY Sensor, MD;  Location: [facility];  Service: Orthopedics;  Laterality: Left;   TOTAL HIP ARTHROPLASTY Right 11/12/2023   Procedure: ARTHROPLASTY, HIP, TOTAL, ANTERIOR APPROACH;  Surgeon: Fidel Rogue, MD;  Location: [facility];  Service: Orthopedics;  Laterality: Right;   TOTAL SHOULDER ARTHROPLASTY     TUBAL LIGATION     UPPER GASTROINTESTINAL ENDOSCOPY  2012   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   WRIST SURGERY Left 09/16/2022   wrist/arm   Social History  Socioeconomic History   Marital status: Married    Spouse name: Cheryl   Number of children: 2   Years of education: Not on file   Highest education level: Not on file  Occupational History   Occupation: retired  Tobacco Use   Smoking status: Former    Current packs/day: 0.00    Average packs/day: 2.5 packs/day for 20.0 years (50.0 ttl pk-yrs)    Types: Cigarettes    Start date: 05/26/1961    Quit date: 05/26/1981    Years since quitting: 42.5    Passive exposure: Never   Smokeless tobacco: Never   Tobacco comments:    Smoked [phone], up to 2.5 ppd  Vaping Use   Vaping status: Never Used  Substance and Sexual Activity   Alcohol  use: Yes    Alcohol /week: 14.0 standard drinks of alcohol     Types: 14 Cans of beer per week    Comment: 2% beer   Drug use: Never   Sexual activity: Yes    Birth control/protection: Surgical    Comment: Hysterectomy  Other Topics Concern   Not on file  Social History Narrative   Not on file   Social Drivers of Health   Financial Resource Strain: Low Risk  (02/21/2022)   Overall Financial Resource Strain (CARDIA)    Difficulty of Paying Living Expenses: Not hard at all  Food Insecurity: Patient Declined (11/27/2023)   Hunger Vital Sign    Worried About Running Out of Food in the Last Year: Patient declined    Ran Out of Food in the Last Year: Patient declined  Transportation Needs: Patient Declined (11/27/2023)    PRAPARE - Administrator, Civil Service (Medical): Patient declined    Lack of Transportation (Non-Medical): Patient declined  Physical Activity: Inactive (02/21/2022)   Exercise Vital Sign    Days of Exercise per Week: 0 days    Minutes of Exercise per Session: 0 min  Stress: Stress Concern Present (02/21/2022)   [employer] of Occupational Health - Occupational Stress Questionnaire    Feeling of Stress : Rather much  Social Connections: Unknown (11/27/2023)   Social Connection and Isolation Panel    Frequency of Communication with Friends and Family: Three times a week    Frequency of Social Gatherings with Friends and Family: Three times a week    Attends Religious Services: Not on file    Active Member of Clubs or Organizations: Yes    Attends Club or Organization Meetings: 1 to 4 times per year    Marital Status: Married  Recent Concern: Social Connections - Moderately Isolated (10/03/2023)   Social Connection and Isolation Panel    Frequency of Communication with Friends and Family: More than three times a week    Frequency of Social Gatherings with Friends and Family: More than three times a week    Attends Religious Services: Never    Database administrator or Organizations: No    Attends Engineer, structural: Never    Marital Status: Married   Family History  Problem Relation Age of Onset   Cancer Mother        pancreatic   Diabetes Mother    Pancreatic cancer Mother    Heart disease Father        Rheumatic   Cancer Father        ? stomach   Stomach cancer Father    Cancer Sister        stomach   Stomach  cancer Sister    Cancer Maternal Aunt        X 4; ? primary   Heart disease Paternal Aunt    Cancer Maternal Grandmother        cervical   Colon cancer Other        Aunts   Multiple sclerosis Daughter    Hypertension Neg Hx    Stroke Neg Hx    Esophageal cancer Neg Hx    Rectal cancer Neg Hx    Allergies  Allergen Reactions    Darvon [Propoxyphene Hcl] Shortness Of Breath    ? Dose Related ? Lowered respirations greatly   Demerol [Meperidine] Shortness Of Breath and Other (See Comments)    Respiratory Distress   Penicillins Hives and Other (See Comments)    Has patient had a PCN reaction causing immediate rash, facial/tongue/throat swelling, SOB or lightheadedness with hypotension: No SEVERE RASH INVOLVING MUCUS MEMBRANES or SKIN NECROSIS: #  #  #  YES  #  #  #  Has patient had a PCN reaction that required hospitalization No Has patient had a PCN reaction occurring within the last 10 years: No.    Imuran [Azathioprine] Other (See Comments)    Weakness   Sulfa  Antibiotics Nausea Only and Other (See Comments)    States had severe indigestion and heartburn and nausea and had to stop taking   Arava [Leflunomide] Other (See Comments)    Excessive weight gain   Lipitor [Atorvastatin ] Nausea And Vomiting    Currently taking 2 times weekly - able to tolerate low dose    Vytorin [Ezetimibe-Simvastatin] Nausea And Vomiting   Prior to Admission medications   Medication Sig Start Date End Date Taking? Authorizing Provider  acetaminophen  (TYLENOL ) 325 MG tablet Take 2 tablets (650 mg total) by mouth every 6 (six) hours as needed for mild pain (pain score 1-3) or fever (or Fever >/= 101). 11/20/23  Yes Dorinda Drue DASEN, MD  aspirin  (ASPIRIN  CHILDRENS) 81 MG chewable tablet Chew 1 tablet (81 mg total) by mouth 2 (two) times daily with a meal. 11/13/23 12/28/23 Yes Hill, Valery RAMAN, PA-C  aspirin  EC 81 MG tablet Take 1 tablet (81 mg total) by mouth 2 (two) times daily for 28 days. Swallow whole. 11/29/23 12/27/23 Yes Renae Bernarda HERO, PA-C  atorvastatin  (LIPITOR) 10 MG tablet TAKE 1 TAB ON MONDAYS AND FRIDAY 02/26/23  Yes Burns, Glade PARAS, MD  Biotin  5000 MCG TABS Take 10,000 mcg by mouth in the morning.   Yes [provider]  Carboxymethylcellul-Glycerin  (LUBRICATING EYE DROPS OP) Place 1 drop into both eyes daily as needed (dry  eyes).   Yes [provider]  carvedilol  (COREG ) 3.125 MG tablet Take 1 tablet (3.125 mg total) by mouth 2 (two) times daily with a meal. 11/10/23  Yes Burns, Glade PARAS, MD  cephALEXin  (KEFLEX ) 500 MG capsule Take 500 mg by mouth 3 (three) times daily. For 10 days   Yes [provider]  chlorhexidine  (HIBICLENS ) 4 % external liquid Apply 15 mLs (1 Application total) topically as directed for 30 doses. Use as directed daily for 5 days every other week for 6 weeks. 11/12/23  Yes Hill, Valery RAMAN, PA-C  cholecalciferol  (VITAMIN D ) 25 MCG (1000 UNIT) tablet Take 1,000 Units by mouth in the morning.   Yes [provider]  cyclobenzaprine  (FLEXERIL ) 10 MG tablet TAKE 1 TABLET BY MOUTH THREE TIMES A DAY AS NEEDED FOR MUSCLE SPASM Patient taking differently: Take 10 mg by mouth at  bedtime. 06/11/23  Yes Burns, Glade PARAS, MD  denosumab  (PROLIA ) 60 MG/ML SOSY injection Inject 60 mg into the skin every 6 (six) months. Deliver to rheum: [address], [place], [place] [address]. Appt on 07/23/2022 07/17/22 11/26/24 Yes Cheryl Waddell HERO, PA-C  diazepam  (VALIUM ) 5 MG tablet TAKE 1 TABLET BY MOUTH AT BEDTIME AS NEEDED FOR MUSCLE SPASMS. Patient taking differently: Take 5 mg by mouth daily as needed for muscle spasms. 04/28/23  Yes Burns, Glade PARAS, MD  docusate sodium  (COLACE) 100 MG capsule Take 1 capsule (100 mg total) by mouth daily. 11/21/23  Yes Dorinda Drue DASEN, MD  DULoxetine  (CYMBALTA ) 20 MG capsule Take 20 mg by mouth at bedtime.   Yes [provider]  folic acid  (FOLVITE ) 1 MG tablet Take 1 tablet (1 mg total) by mouth daily. 09/02/23  Yes Cheryl Waddell HERO, PA-C  furosemide  (LASIX ) 20 MG tablet TAKE 1 TABLET BY MOUTH EVERY DAY 08/19/23  Yes Burns, Glade PARAS, MD  methotrexate  (RHEUMATREX) 2.5 MG tablet Take 5 tablets (12.5 mg total) by mouth once a week. Caution:Chemotherapy. Protect from light. 09/02/23  Yes Cheryl Waddell HERO, PA-C  mupirocin  ointment (BACTROBAN ) 2 % Place 1 Application into the  nose 2 (two) times daily for 60 doses. Use as directed 2 times daily for 5 days every other week for 6 weeks. 11/12/23 12/12/23 Yes Hill, Valery RAMAN, PA-C  oxybutynin  (DITROPAN ) 5 MG tablet Take 5 mg by mouth daily. 11/06/23  Yes [provider]  pantoprazole  (PROTONIX ) 40 MG tablet Take 1 tablet (40 mg total) by mouth 2 (two) times daily before a meal. 07/27/23  Yes Burns, Glade PARAS, MD  polyethylene glycol (MIRALAX  / GLYCOLAX ) 17 g packet Take 17 g by mouth daily as needed for mild constipation. 11/20/23  Yes Dorinda Drue DASEN, MD  predniSONE  (DELTASONE ) 5 MG tablet TAKE 1 TABLET BY MOUTH EVERY DAY IN THE MORNING 05/22/23  Yes Cheryl Waddell HERO, PA-C  senna (SENOKOT) 8.6 MG tablet Take 1 tablet by mouth 2 (two) times daily.   Yes [provider]  traMADol  (ULTRAM ) 50 MG tablet Take 25-50 mg by mouth every 8 (eight) hours as needed for moderate pain (pain score 4-6).   Yes [provider]  bisacodyl  (DULCOLAX) 10 MG suppository Place 1 suppository (10 mg total) rectally daily as needed for moderate constipation. 11/20/23   Dorinda Drue DASEN, MD  diclofenac sodium (VOLTAREN) 1 % GEL APPLY 3 GRAMS TO 3 LARGE JOINTS 3 TIMES A DAY AS NEEDED Patient not taking: Reported on 11/27/2023 01/27/18   Dolphus Reiter, MD  Upadacitinib  ER ([name] ) 15 MG TB24 Take 15 mg by mouth daily. Patient not taking: Reported on 11/27/2023    [provider]   DG Pelvis Portable Result Date: 11/30/2023 CLINICAL DATA:  [id number] Hip instability, right [id number] EXAM: PORTABLE PELVIS 1-2 VIEWS COMPARISON:  11/16/2023 FINDINGS: Please note the distal femoral stem is not included in the field of view. The femoral component of right hip arthroplasty remains superiorly dislocated with respect to the acetabular cup. There may be more superior migration than on prior exam. No definite fracture is seen. Overlying skin staples remain in place. Portions of left hip arthroplasty are visualized. IMPRESSION: Persistent superior  dislocation of the femoral component of right hip arthroplasty. Electronically Signed   By: Andrea Gasman M.D.   On: 11/30/2023 11:52   DG Swallowing Func-Speech Pathology Result Date: 11/30/2023 Table formatting from the original result was not included. Modified Barium Swallow  Study Patient Details Name: Sherri [name] MRN: [id number] Date of Birth: Feb 26, 1941 Today's Date: 11/30/2023 HPI/PMH: HPI: Pt is an 83 y.o. female who presented to the ED 7/4 due to concerns for exposed surgical hardware from previous elbow surgery in June. SLP consulted due to RN's observance of coughing with liquids. PMH: hypertension, rheumatoid arthritis, chronic pain, urinary retention. Clinical Impression: Clinical Impression: Pt demonstrates a moderate to severe oropharyngeal dysphagia with a DIGEST score of 4 due to chronic and gross aspiration of thin liquids and poor efficiency of swallowing with greater than 25% of bolus residue remaining in valleculae with solids. Primary problem is decreased epiglottic deflection and decreased PES opening, both appearing due to structural changes of pharynx after ACDF. Pt has good laryngeal elevation and closure with initial swallow, but residue in valleculae and above PES is penetrated post swallow and ultimately aspirated with insufficient sensation. Attempted head turns, chin tuck, supraglottic swallow with thin, none of which prevented aspiration. A chin tuck increased epiglottic deflection, but worsened PES opening and severity of aspiration. Head turn did not improve efficiency with solids. AP view showed symmetrical residue. Pt did show potential to improve epiglottic deflection, base of tongue retraction and ultimately bolus propulsion with effort and strengthing exercises to this area may be of benefit. Pt was able to better manage mildly thick/nectar liquids with improved airway proection and time for multiple swallows. Will need to discuss pts wishes. This dysphagia is chronic,  but pt is deconditioned at this time (recent hip fx and arm surgery) and has lost weight possibly due to dysphagia. Will advise nectar thick liquids, IDDSI 5 solids, multiple swallow and coughing, RMST, water  protocol and BOT and pharyngeal stregthening in next session. Factors that may increase risk of adverse event in presence of aspiration Noe & Lianne 2021): No data recorded Recommendations/Plan: Swallowing Evaluation Recommendations Swallowing Evaluation Recommendations Recommendations: PO diet; Free water  protocol after oral care PO Diet Recommendation: Dysphagia 2 (Finely chopped); Mildly thick liquids (Level 2, nectar thick) Liquid Administration via: Cup; Straw Medication Administration: Whole meds with liquid Supervision: Patient able to self-feed Swallowing strategies  : Slow rate; Small bites/sips; effortful swallow; Multiple dry swallows after each bite/sip; Follow solids with liquids; Clear throat intermittently Postural changes: Position pt fully upright for meals Oral care recommendations: Oral care before ice chips/water ; Oral care before PO Treatment Plan Treatment Plan Treatment recommendations: Therapy as outlined in treatment plan below Follow-up recommendations: Acute inpatient rehab (3 hours/day) Functional status assessment: Patient has had a recent decline in their functional status and demonstrates the ability to make significant improvements in function in a reasonable and predictable amount of time. Treatment frequency: Min 2x/week Treatment duration: 2 weeks Interventions: Aspiration precaution training; Oropharyngeal exercises; Compensatory techniques; Patient/family education; Diet toleration management by SLP; Respiratory muscle strength training Recommendations Recommendations for follow up therapy are one component of a multi-disciplinary discharge planning process, led by the attending physician.  Recommendations may be updated based on patient status, additional functional  criteria and insurance authorization. Assessment: Orofacial Exam: Orofacial Exam Oral Cavity: Oral Hygiene: WFL Oral Cavity - Dentition: Adequate natural dentition Anatomy: Anatomy: Presence of cervical hardware Boluses Administered: Boluses Administered Boluses Administered: Thin liquids (Level 0); Mildly thick liquids (Level 2, nectar thick); Puree; Solid  Oral Impairment Domain: Oral Impairment Domain Lip Closure: No labial escape Tongue control during bolus hold: Cohesive bolus between tongue to palatal seal Bolus preparation/mastication: Timely and efficient chewing and mashing Bolus transport/lingual motion: Brisk tongue motion Oral residue: Trace residue  lining oral structures Location of oral residue : Tongue Initiation of pharyngeal swallow : Posterior angle of the ramus  Pharyngeal Impairment Domain: Pharyngeal Impairment Domain Soft palate elevation: No bolus between soft palate (SP)/pharyngeal wall (PW) Laryngeal elevation: Complete superior movement of thyroid  cartilage with complete approximation of arytenoids to epiglottic petiole Anterior hyoid excursion: Complete anterior movement Epiglottic movement: Partial inversion Laryngeal vestibule closure: Complete, no air/contrast in laryngeal vestibule Pharyngeal stripping wave : Present - diminished Pharyngeal contraction (A/P view only): Complete Pharyngoesophageal segment opening: Partial distention/partial duration, partial obstruction of flow Tongue base retraction: Wide column of contrast or air between tongue base and PPW Pharyngeal residue: Majority of contrast within or on pharyngeal structures Location of pharyngeal residue: Valleculae; Pyriform sinuses; Tongue base  Esophageal Impairment Domain: No data recorded Pill: No data recorded Penetration/Aspiration Scale Score: Penetration/Aspiration Scale Score 1.  Material does not enter airway: Puree; Solid 2.  Material enters airway, remains ABOVE vocal cords then ejected out: Thin liquids (Level  0); Mildly thick liquids (Level 2, nectar thick) 4.  Material enters airway, CONTACTS cords then ejected out: Mildly thick liquids (Level 2, nectar thick) 5.  Material enters airway, CONTACTS cords and not ejected out: Thin liquids (Level 0) 7.  Material enters airway, passes BELOW cords and not ejected out despite cough attempt by patient: Thin liquids (Level 0) 8.  Material enters airway, passes BELOW cords without attempt by patient to eject out (silent aspiration) : Thin liquids (Level 0) Compensatory Strategies: Compensatory Strategies Compensatory strategies: Yes Straw: Ineffective Effortful swallow: Effective Effective Effortful Swallow: Puree; Mildly thick liquid (Level 2, nectar thick) Multiple swallows: Effective Effective Multiple Swallows: Mildly thick liquid (Level 2, nectar thick); Puree Chin tuck: Ineffective Ineffective Chin Tuck: Thin liquid (Level 0) Liquid wash: Effective Effective Liquid Wash: Mildly thick liquid (Level 2, nectar thick); Puree Left head turn: Ineffective Ineffective Left Head Turn: Mildly thick liquid (Level 2, nectar thick); Puree Right head turn: Ineffective Ineffective Right Head Turn: Mildly thick liquid (Level 2, nectar thick); Puree Supraglottic swallow: Ineffective Ineffective Supraglottic Swallow: Thin liquid (Level 0) Super supraglottic swallow: Ineffective Ineffective Super Supraglottic Swallow: Thin liquid (Level 0) Mendelsohn : Ineffective Ineffective Mendelsohn: Thin liquid (Level 0)   General Information: Caregiver present: No  Diet Prior to this Study: Regular; Thin liquids (Level 0)   Temperature : Normal   Respiratory Status: WFL   Supplemental O2: None (Room air)   History of Recent Intubation: No  Behavior/Cognition: Alert; Cooperative; Pleasant mood Self-Feeding Abilities: Able to self-feed Baseline vocal quality/speech: Dysphonic Volitional Cough: Able to elicit Volitional Swallow: Able to elicit No data recorded Goal Planning: Prognosis for improved  oropharyngeal function: Good No data recorded No data recorded Patient/Family Stated Goal: none stated No data recorded Pain: Pain Assessment Pain Assessment: No/denies pain End of Session: Start Time:SLP Start Time (ACUTE ONLY): 0834 Stop Time: SLP Stop Time (ACUTE ONLY): 0856 Time Calculation:SLP Time Calculation (min) (ACUTE ONLY): 22 min Charges: SLP Evaluations $ SLP Speech Visit: 1 Visit SLP Evaluations $BSS Swallow: 1 Procedure $MBS Swallow: 1 Procedure $Swallowing Treatment: 1 Procedure SLP visit diagnosis: SLP Visit Diagnosis: Dysphagia, unspecified (R13.10) Past Medical History: Past Medical History: Diagnosis Date  Anxiety   takes Valium  daily as needed  Basal cell carcinoma 01/21/1988  left nostril (MOHS), sup-right calf (CX35FU)  Basal cell carcinoma 03/22/1996  right calf (CX35FU)  Basal cell carcinoma 03/31/1995  left wing nose  Bruises easily   d/t meds  Cancer (HCC)   basal cell ca, in situ-  uterine   Cataracts, bilateral   removed bilateral  Chronic back pain   Dizziness   r/t to meds  GERD (gastroesophageal reflux disease)   no meds on a regular basis but will take Tums if needed  Headache(784.0)   r/t neck issues  History of bronchitis 6-35yrs ago  History of colon polyps   benign  HOH (hard of hearing)   wears hearing aids  Hyperlipidemia   takes Atorvastatin  on Mondays and Fridays  Hypertension   Joint pain   Joint swelling   Multiple sclerosis (HCC)   Neuromuscular disorder (HCC)   Dr. FABIENE Crete- [facility], follows [facility]  Nocturia   PONV (postoperative nausea and vomiting)   trouble urinating after surgery in 2014  Postoperative nausea and vomiting 01/11/2019  Rheumatoid arthritis (HCC)   Dr Lorrayne weekly, RA- hands- knees- feet   Rheumatoid arthritis(714.0)   Dr Dolphus takes Xeljanz  daily  Right wrist fracture   Skin cancer   Squamous cell carcinoma of skin 11/02/2001  in situ-right knee (cx12fu)  Squamous cell carcinoma of skin 11/12/2011  in situ-left forearm (CX35FU),  in situ-left foot (CX35FU)  Squamous cell carcinoma of skin 01/22/2012  in situ-left lower forearm (txpbx)  Squamous cell carcinoma of skin 11/17/2012  right shin (txpbx)  Squamous cell carcinoma of skin 10/28/2013  in situ-right shoulder (CX35FU)  Squamous cell carcinoma of skin 04/28/2014  well diff-right shin (txpbx)  Squamous cell carcinoma of skin 11/02/2014  in situ-Left shin (txpbx)  Squamous cell carcinoma of skin 12/15/2014  in situ-Left hand (txpbx), bowens-left side chest (txpbx)  Squamous cell carcinoma of skin 05/09/2015  in situ-left hand (txpbx)   Squamous cell carcinoma of skin 05/07/2016  in situ-left inner shin,ant, in situ-left inner shin, post, in situ-left outer forearm, in situ-right knuckle  Squamous cell carcinoma of skin 03/1302018  in situ-left shoulder (txpbx), in situ-right inner shin (txpbx), in situ-top of left foot (txpbx), KA- left forearm (txpbx)  Squamous cell carcinoma of skin 12/02/2016  in situ-left inner shin (txpbx)  Squamous cell carcinoma of skin 03/04/2017  in situ-left outer sup, shin (txpbx), in situ- right 2nd knuckle finger (txpbx), in situ- Left wrist (txpbx)  Squamous cell carcinoma of skin 04/06/2018  in situ-above left knee inner (txpbx)  Squamous cell carcinoma of skin 04/21/2018  in situ-right top hand (txpbx)  Squamous cell carcinoma of skin 07/08/2018  in situ-right lower inner shin (txpbx)  Squamous cell carcinoma of skin 04/27/2019  in situ- Left neck(CX35FU), In situ- right neck (CX35FU)  Urinary retention   sees Dr.Wrenn about 2 times a yr Past Surgical History: Past Surgical History: Procedure Laterality Date  ABDOMINAL HYSTERECTOMY    1985  ANTERIOR CERVICAL DECOMP/DISCECTOMY FUSION N/A 04/23/2022  Procedure: Anterior Cervical Decompression/Discectomy Fusion - Cervical Seven-Thoracic One;  Surgeon: Louis Shove, MD;  Location: [facility];  Service: Neurosurgery;  Laterality: N/A;  3C  APPENDECTOMY    with TAH  BACK SURGERY    several  BREAST BIOPSY Right    Results were negative  BROW LIFT Bilateral 10/02/2016  Procedure: BILATERAL LOWER LID BLEPHAROPLASTY;  Surgeon: Laurie Loyd Redhead, MD;  Location: [facility];  Service: Plastics;  Laterality: Bilateral;  CARPAL TUNNEL RELEASE Right   cataracts    CERVICAL FUSION    Dr Leeann  CERVICAL FUSION  12/31/2012  Dr Leeann  CERVICAL FUSION  04/2022  CHEST TUBE INSERTION    for traumatic Pneumothorax  COLONOSCOPY  07/16/2010  normal   COLONOSCOPY W/ POLYPECTOMY  1997  negative since; Dr Debrah  ESOPHAGOGASTRODUODENOSCOPY  07/16/2010  normal  eye lid raise    EYE SURGERY Bilateral   cataracts removed - /w IOL  HARDWARE REMOVAL  08/2021  HIP CLOSED REDUCTION Right 11/16/2023  Procedure: CLOSED MANIPULATION, JOINT, HIP;  Surgeon: Ernie Cough, MD;  Location: [facility];  Service: Orthopedics;  Laterality: Right;  INCISION AND DRAINAGE OF WOUND Right 11/28/2023  Procedure: IRRIGATION AND DEBRIDEMENT WOUND; REMOVAL OF HARDWARE;  Surgeon: Edna Toribio LABOR, MD;  Location: [facility];  Service: Orthopedics;  Laterality: Right;  I & D RIGHT ELBOW  JOINT REPLACEMENT    LUMBAR FUSION    Dr Leeann  NASAL SINUS SURGERY    ORIF ELBOW FRACTURE Right 11/20/2023  Procedure: OPEN REDUCTION INTERNAL FIXATION (ORIF) ELBOW/OLECRANON FRACTURE;  Surgeon: [name] Franky SQUIBB, MD;  Location: [facility];  Service: Orthopedics;  Laterality: Right;  POSTERIOR CERVICAL FUSION/FORAMINOTOMY N/A 12/31/2012  Procedure: POSTERIOR LATERAL CERVICAL FUSION/FORAMINOTOMY LEVEL 1 CERVICAL THREE-FOUR WITH LATERAL MASS SCREWS;  Surgeon: Catalina CHRISTELLA Leeann, MD;  Location: [facility];  Service: Neurosurgery;  Laterality: N/A;  PTOSIS REPAIR Bilateral 10/02/2016  Procedure: INTERNAL PTOSIS REPAIR;  Surgeon: Laurie Loyd Redhead, MD;  Location: [facility];  Service: Plastics;  Laterality: Bilateral;  SKIN BIOPSY    THYROID  SURGERY    R lobe removed- 1972, has grown back - CT last done- 2018  TOTAL HIP ARTHROPLASTY  12/22/2011  Procedure: TOTAL HIP ARTHROPLASTY;  Surgeon: Dempsey JINNY Sensor, MD;   Location: [facility];  Service: Orthopedics;  Laterality: Left;  TOTAL HIP ARTHROPLASTY Right 11/12/2023  Procedure: ARTHROPLASTY, HIP, TOTAL, ANTERIOR APPROACH;  Surgeon: Fidel Rogue, MD;  Location: [facility];  Service: Orthopedics;  Laterality: Right;  TOTAL SHOULDER ARTHROPLASTY    TUBAL LIGATION    UPPER GASTROINTESTINAL ENDOSCOPY  2012  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  Physical Exam: General: Alert, no acute distress Cardiovascular: No pedal edema Respiratory: No cyanosis, no use of accessory musculature GI: No organomegaly, abdomen is soft and non-tender Skin: No lesions in the area of chief complaint Neurologic: Sensation intact distally Psychiatric: Patient is competent for consent with normal mood and affect Lymphatic: No axillary or cervical lymphadenopathy  MUSCULOSKELETAL: R hip dressing c/d/I. Shortened and rotated. Nvi.  Assessment: R periprosthetic hip dislocation x2.  Plan: This is an unfortunate situation, likely caused from weakness/decondition from prolonged nonambulatory status.  To OR for closed reduction. R/B/A discussed. If she dislocates a third time, then we will discuss conversion to constrained liner.    Rogue JINNY Fidel, MD [phone]    11/30/2023 6:24 PM

## 2023-11-30 NOTE — Care Management Important Message (Signed)
 Important Message  Patient Details  Name: Etter  [name] MRN: [id number] Date of Birth: April 25, 1941   Important Message Given:  Yes - Medicare IM     Jon Cruel 11/30/2023, 12:00 PM

## 2023-12-01 ENCOUNTER — Encounter (HOSPITAL_COMMUNITY): Payer: Self-pay | Admitting: Orthopedic Surgery

## 2023-12-01 DIAGNOSIS — T8130XA Disruption of wound, unspecified, initial encounter: Secondary | ICD-10-CM | POA: Diagnosis not present

## 2023-12-01 DIAGNOSIS — E871 Hypo-osmolality and hyponatremia: Secondary | ICD-10-CM | POA: Diagnosis not present

## 2023-12-01 DIAGNOSIS — L03113 Cellulitis of right upper limb: Secondary | ICD-10-CM | POA: Diagnosis not present

## 2023-12-01 MED ORDER — OXYCODONE HCL 5 MG PO TABS: 5.0000 mg | ORAL_TABLET | ORAL | 0 refills | Status: DC | PRN

## 2023-12-01 MED ORDER — ENSURE PLUS HIGH PROTEIN PO LIQD
237.0000 mL | Freq: Two times a day (BID) | ORAL | Status: DC
  Administered 2023-12-02 – 2023-12-04 (×2): 237 mL via ORAL

## 2023-12-01 NOTE — Plan of Care (Signed)

## 2023-12-01 NOTE — Progress Notes (Signed)
 [facility] PROGRESS NOTE    Progress Note  Jobina  A Loh  FMW:[id number] DOB: [birth year] DOA: 11/26/2023 PCP: [name] PARAS, MD     Brief Narrative:   Sherri  [name] is an 83 y.o. 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 Assessment/Plan:   Right elbow visible hardware with Wound dehiscence with cellulitis: Orthopedic surgery to recommend further antibiotics. Status post I&D of the right elbow on 11/27/2023 and hardware removal. Blood cultures ordered on 11/27/2023 which have remained negative to date. Now on oral Doxy and cefuroxime , orthopedic surgery agree with antibiotics orally which will follow-up with them as an outpatient. Continue oral antibiotics for 4 to 6 weeks.  She has remained afebrile with no leukocytosis on current treatment. Orthopedic surgery recommended aspirin  for 4 weeks. Follow-up with orthopedic surgery in 2 weeks. PT eval is pending.  Hypovolemic hyponatremia: Sodium is improving continue IV fluids recheck in the morning.  Hypokalemia: Replete IV recheck in the morning  Macrocytic anemia/acute blood loss anemia B12 500 folate of 18, status post 2 units of packed red blood cells hemoglobin this morning is 10.8.  Rheumatoid arthritis: Continue methotrexate  and prednisone . Will have low threshold to give her stress dose steroids in the setting of possible infection.  History of multiple sclerosis: She is no longer on these medication.  Chronic pain: Continue Cymbalta .  Urinary retention: Continue oxybutynin .  History of recent right hip surgery in May 2025: She will need skilled nursing  facility.  Goals of care: Palliative care to meet with family     DVT prophylaxis: lovenox  Family Communication:none Status is: Observation The patient will require care spanning > 2 midnights and should be moved to inpatient because: Right elbow wound dehiscence with visible hardware and possible cellulitis    Code Status:     Code Status Orders  (From admission, onward)           Start     Ordered   11/27/23 0348  Full code  Continuous       Question:  By:  Answer:  Consent: discussion documented in EHR   11/27/23 0347           Code Status History     Date Active Date Inactive Code Status Order ID Comments User Context   11/10/2023 2225 11/20/2023 2039 Full Code [id number]  Dena Charleston, MD Inpatient   10/03/2023 0011 10/09/2023 1603 Full Code [id number]  Tobie Jorie SAUNDERS, MD ED   04/23/2022 1128 04/24/2022 1948 Full Code [id number]  Louis Shove, MD Inpatient   09/20/2021 2106 09/21/2021 1508 Full Code [id number]  Louis Shove, MD Inpatient   08/19/2021 1149 08/20/2021 1526 Full Code [id number]  Louis Shove, MD Inpatient         IV Access:   Peripheral IV   Procedures and diagnostic studies:   DG HIP UNILAT WITH PELVIS 1V RIGHT Result Date: 11/30/2023 CLINICAL DATA:  Elective surgery.  Closed reduction of right hip. EXAM: DG HIP (WITH OR WITHOUT PELVIS) 1V RIGHT COMPARISON:  Pelvis radiograph earlier today FINDINGS: Persistent superior dislocation of the femoral component of right hip arthroplasty. The femoral component may be slightly more laterally displaced than on prior exam. Overlying skin staples remain in place. Barium previously  in the small bowel has progressed to the colon. 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 The femoral component may be slightly more laterally displaced than on prior exam. Electronically Signed   By: Andrea Gasman M.D.   On: 11/30/2023 19:54   DG Pelvis Portable Result Date:  11/30/2023 CLINICAL DATA:  [id number] Hip instability, right [id number] EXAM: PORTABLE PELVIS 1-2 VIEWS COMPARISON:  11/16/2023 FINDINGS: Please note the distal femoral stem is not included in the field of view. The femoral component of right hip arthroplasty remains superiorly dislocated with respect to the acetabular cup. There may be more superior migration than on prior exam. No definite fracture is seen. Overlying skin staples remain in place. Portions of left hip arthroplasty are visualized. 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 Electronically Signed   By: Andrea Gasman M.D.   On: 11/30/2023 11:52   DG Swallowing Func-Speech Pathology Result Date: 11/30/2023 Table formatting from the original result was not included. Modified Barium Swallow Study Patient Details Name: Sherri [name] MRN: [id number] Date of Birth: 10-10-1940 Today's Date: 11/30/2023 HPI/PMH: HPI: Pt is an 83 y.o. female who presented to the ED 7/4 due to concerns for exposed surgical hardware from previous elbow surgery in June. SLP consulted due to RN's observance of coughing with liquids. PMH: hypertension, rheumatoid arthritis, chronic pain, urinary retention. Clinical Impression: Clinical Impression: Pt demonstrates a moderate to severe oropharyngeal dysphagia with a DIGEST score of 4 due to chronic and gross aspiration of thin liquids and poor efficiency of swallowing with greater than 25% of bolus residue remaining in valleculae with solids. Primary problem is decreased epiglottic deflection and decreased PES opening, both appearing due to structural changes of pharynx after ACDF. Pt has good laryngeal elevation and closure with initial swallow, but residue in valleculae and above PES is penetrated post swallow and ultimately aspirated with insufficient sensation. Attempted head turns, chin tuck, supraglottic swallow with thin, none of which prevented aspiration. A chin tuck increased  epiglottic deflection, but worsened PES opening and severity of aspiration. Head turn did not improve efficiency with solids. AP view showed symmetrical residue. Pt did show potential to improve epiglottic deflection, base of tongue retraction and ultimately bolus propulsion with effort and strengthing exercises to this area may be of benefit. Pt was able to better manage mildly thick/nectar liquids with improved airway proection and time for multiple swallows. Will need to discuss pts wishes. This dysphagia is chronic, but pt is deconditioned at this time (recent hip fx and arm surgery) and has lost weight possibly due to dysphagia. Will advise nectar thick liquids, IDDSI 5 solids, multiple swallow and coughing, RMST, water  protocol and BOT and pharyngeal stregthening in next session. Factors that may increase risk of adverse event in presence of aspiration Noe & Lianne 2021): No data recorded Recommendations/Plan: Swallowing Evaluation Recommendations Swallowing Evaluation Recommendations Recommendations: PO diet; Free water  protocol after oral care PO Diet Recommendation: Dysphagia 2 (Finely chopped); Mildly thick liquids (Level 2, nectar thick) Liquid Administration via: Cup; Straw Medication Administration: Whole meds with liquid Supervision: Patient able to self-feed Swallowing strategies  : Slow rate; Small bites/sips; effortful swallow; Multiple dry swallows after each bite/sip; Follow solids with liquids; Clear throat intermittently Postural changes: Position pt fully upright for meals Oral care recommendations: Oral care before ice chips/water ; Oral care before PO Treatment Plan Treatment Plan Treatment recommendations: Therapy as outlined in treatment plan below Follow-up recommendations: Acute inpatient rehab (3  hours/day) Functional status assessment: Patient has had a recent decline in their functional status and demonstrates the ability to make significant improvements in function in a reasonable  and predictable amount of time. Treatment frequency: Min 2x/week Treatment duration: 2 weeks Interventions: Aspiration precaution training; Oropharyngeal exercises; Compensatory techniques; Patient/family education; Diet toleration management by SLP; Respiratory muscle strength training Recommendations Recommendations for follow up therapy are one component of a multi-disciplinary discharge planning process, led by the attending physician.  Recommendations may be updated based on patient status, additional functional criteria and insurance authorization. Assessment: Orofacial Exam: Orofacial Exam Oral Cavity: Oral Hygiene: WFL Oral Cavity - Dentition: Adequate natural dentition Anatomy: Anatomy: Presence of cervical hardware Boluses Administered: Boluses Administered Boluses Administered: Thin liquids (Level 0); Mildly thick liquids (Level 2, nectar thick); Puree; Solid  Oral Impairment Domain: Oral Impairment Domain Lip Closure: No labial escape Tongue control during bolus hold: Cohesive bolus between tongue to palatal seal Bolus preparation/mastication: Timely and efficient chewing and mashing Bolus transport/lingual motion: Brisk tongue motion Oral residue: Trace residue lining oral structures Location of oral residue : Tongue Initiation of pharyngeal swallow : Posterior angle of the ramus  Pharyngeal Impairment Domain: Pharyngeal Impairment Domain Soft palate elevation: No bolus between soft palate (SP)/pharyngeal wall (PW) Laryngeal elevation: Complete superior movement of thyroid  cartilage with complete approximation of arytenoids to epiglottic petiole Anterior hyoid excursion: Complete anterior movement Epiglottic movement: Partial inversion Laryngeal vestibule closure: Complete, no air/contrast in laryngeal vestibule Pharyngeal stripping wave : Present - diminished Pharyngeal contraction (A/P view only): Complete Pharyngoesophageal segment opening: Partial distention/partial duration, partial obstruction of  flow Tongue base retraction: Wide column of contrast or air between tongue base and PPW Pharyngeal residue: Majority of contrast within or on pharyngeal structures Location of pharyngeal residue: Valleculae; Pyriform sinuses; Tongue base  Esophageal Impairment Domain: No data recorded Pill: No data recorded Penetration/Aspiration Scale Score: Penetration/Aspiration Scale Score 1.  Material does not enter airway: Puree; Solid 2.  Material enters airway, remains ABOVE vocal cords then ejected out: Thin liquids (Level 0); Mildly thick liquids (Level 2, nectar thick) 4.  Material enters airway, CONTACTS cords then ejected out: Mildly thick liquids (Level 2, nectar thick) 5.  Material enters airway, CONTACTS cords and not ejected out: Thin liquids (Level 0) 7.  Material enters airway, passes BELOW cords and not ejected out despite cough attempt by patient: Thin liquids (Level 0) 8.  Material enters airway, passes BELOW cords without attempt by patient to eject out (silent aspiration) : Thin liquids (Level 0) Compensatory Strategies: Compensatory Strategies Compensatory strategies: Yes Straw: Ineffective Effortful swallow: Effective Effective Effortful Swallow: Puree; Mildly thick liquid (Level 2, nectar thick) Multiple swallows: Effective Effective Multiple Swallows: Mildly thick liquid (Level 2, nectar thick); Puree Chin tuck: Ineffective Ineffective Chin Tuck: Thin liquid (Level 0) Liquid wash: Effective Effective Liquid Wash: Mildly thick liquid (Level 2, nectar thick); Puree Left head turn: Ineffective Ineffective Left Head Turn: Mildly thick liquid (Level 2, nectar thick); Puree Right head turn: Ineffective Ineffective Right Head Turn: Mildly thick liquid (Level 2, nectar thick); Puree Supraglottic swallow: Ineffective Ineffective Supraglottic Swallow: Thin liquid (Level 0) Super supraglottic swallow: Ineffective Ineffective Super Supraglottic Swallow: Thin liquid (Level 0) Mendelsohn : Ineffective Ineffective  Mendelsohn: Thin liquid (Level 0)   General Information: Caregiver present: No  Diet Prior to this Study: Regular; Thin liquids (Level 0)   Temperature : Normal   Respiratory Status: WFL   Supplemental O2: None (Room air)   History of Recent Intubation: No  Behavior/Cognition: Alert; Cooperative; Pleasant mood Self-Feeding Abilities: Able to self-feed Baseline vocal quality/speech: Dysphonic Volitional Cough: Able to elicit Volitional Swallow: Able to elicit No data recorded Goal Planning: Prognosis for improved oropharyngeal function: Good No data recorded No data recorded Patient/Family Stated Goal: none stated No data recorded Pain: Pain Assessment Pain Assessment: No/denies pain End of Session: Start Time:SLP Start Time (ACUTE ONLY): 0834 Stop Time: SLP Stop Time (ACUTE ONLY): 0856 Time Calculation:SLP Time Calculation (min) (ACUTE ONLY): 22 min Charges: SLP Evaluations $ SLP Speech Visit: 1 Visit SLP Evaluations $BSS Swallow: 1 Procedure $MBS Swallow: 1 Procedure $Swallowing Treatment: 1 Procedure SLP visit diagnosis: SLP Visit Diagnosis: Dysphagia, unspecified (R13.10) Past Medical History: Past Medical History: Diagnosis Date  Anxiety   takes Valium  daily as needed  Basal cell carcinoma 01/21/1988  left nostril (MOHS), sup-right calf (CX35FU)  Basal cell carcinoma 03/22/1996  right calf (CX35FU)  Basal cell carcinoma 03/31/1995  left wing nose  Bruises easily   d/t meds  Cancer (HCC)   basal cell ca, in situ- uterine   Cataracts, bilateral   removed bilateral  Chronic back pain   Dizziness   r/t to meds  GERD (gastroesophageal reflux disease)   no meds on a regular basis but will take Tums if needed  Headache(784.0)   r/t neck issues  History of bronchitis 6-35yrs ago  History of colon polyps   benign  HOH (hard of hearing)   wears hearing aids  Hyperlipidemia   takes Atorvastatin  on Mondays and Fridays  Hypertension   Joint pain   Joint swelling   Multiple sclerosis (HCC)   Neuromuscular disorder (HCC)   Dr.  FABIENE [facility]- [facility], follows [facility]  Nocturia   PONV (postoperative nausea and vomiting)   trouble urinating after surgery in 2014  Postoperative nausea and vomiting 01/11/2019  Rheumatoid arthritis (HCC)   Dr Lorrayne weekly, RA- hands- knees- feet   Rheumatoid arthritis(714.0)   Dr Dolphus takes Xeljanz  daily  Right wrist fracture   Skin cancer   Squamous cell carcinoma of skin 11/02/2001  in situ-right knee (cx57fu)  Squamous cell carcinoma of skin 11/12/2011  in situ-left forearm (CX35FU), in situ-left foot (CX35FU)  Squamous cell carcinoma of skin 01/22/2012  in situ-left lower forearm (txpbx)  Squamous cell carcinoma of skin 11/17/2012  right shin (txpbx)  Squamous cell carcinoma of skin 10/28/2013  in situ-right shoulder (CX35FU)  Squamous cell carcinoma of skin 04/28/2014  well diff-right shin (txpbx)  Squamous cell carcinoma of skin 11/02/2014  in situ-Left shin (txpbx)  Squamous cell carcinoma of skin 12/15/2014  in situ-Left hand (txpbx), bowens-left side chest (txpbx)  Squamous cell carcinoma of skin 05/09/2015  in situ-left hand (txpbx)   Squamous cell carcinoma of skin 05/07/2016  in situ-left inner shin,ant, in situ-left inner shin, post, in situ-left outer forearm, in situ-right knuckle  Squamous cell carcinoma of skin 03/1302018  in situ-left shoulder (txpbx), in situ-right inner shin (txpbx), in situ-top of left foot (txpbx), KA- left forearm (txpbx)  Squamous cell carcinoma of skin 12/02/2016  in situ-left inner shin (txpbx)  Squamous cell carcinoma of skin 03/04/2017  in situ-left outer sup, shin (txpbx), in situ- right 2nd knuckle finger (txpbx), in situ- Left wrist (txpbx)  Squamous cell carcinoma of skin 04/06/2018  in situ-above left knee inner (txpbx)  Squamous cell carcinoma of skin 04/21/2018  in situ-right top hand (txpbx)  Squamous cell carcinoma of skin 07/08/2018  in situ-right lower inner shin (txpbx)  Squamous cell carcinoma of skin  04/27/2019  in situ- Left  neck(CX35FU), In situ- right neck (CX35FU)  Urinary retention   sees Dr.Wrenn about 2 times a yr Past Surgical History: Past Surgical History: Procedure Laterality Date  ABDOMINAL HYSTERECTOMY    1985  ANTERIOR CERVICAL DECOMP/DISCECTOMY FUSION N/A 04/23/2022  Procedure: Anterior Cervical Decompression/Discectomy Fusion - Cervical Seven-Thoracic One;  Surgeon: Louis Shove, MD;  Location: [facility];  Service: Neurosurgery;  Laterality: N/A;  3C  APPENDECTOMY    with TAH  BACK SURGERY    several  BREAST BIOPSY Right   Results were negative  BROW LIFT Bilateral 10/02/2016  Procedure: BILATERAL LOWER LID BLEPHAROPLASTY;  Surgeon: Laurie Loyd Redhead, MD;  Location: [facility];  Service: Plastics;  Laterality: Bilateral;  CARPAL TUNNEL RELEASE Right   cataracts    CERVICAL FUSION    Dr Leeann  CERVICAL FUSION  12/31/2012  Dr Leeann  CERVICAL FUSION  04/2022  CHEST TUBE INSERTION    for traumatic Pneumothorax  COLONOSCOPY  07/16/2010  normal   COLONOSCOPY W/ POLYPECTOMY  1997  negative since; Dr Debrah  ESOPHAGOGASTRODUODENOSCOPY  07/16/2010  normal  eye lid raise    EYE SURGERY Bilateral   cataracts removed - /w IOL  HARDWARE REMOVAL  08/2021  HIP CLOSED REDUCTION Right 11/16/2023  Procedure: CLOSED MANIPULATION, JOINT, HIP;  Surgeon: Ernie Cough, MD;  Location: [facility];  Service: Orthopedics;  Laterality: Right;  INCISION AND DRAINAGE OF WOUND Right 11/28/2023  Procedure: IRRIGATION AND DEBRIDEMENT WOUND; REMOVAL OF HARDWARE;  Surgeon: Edna Toribio LABOR, MD;  Location: [facility];  Service: Orthopedics;  Laterality: Right;  I & D RIGHT ELBOW  JOINT REPLACEMENT    LUMBAR FUSION    Dr Leeann  NASAL SINUS SURGERY    ORIF ELBOW FRACTURE Right 11/20/2023  Procedure: OPEN REDUCTION INTERNAL FIXATION (ORIF) ELBOW/OLECRANON FRACTURE;  Surgeon: [name] Franky SQUIBB, MD;  Location: [facility];  Service: Orthopedics;  Laterality: Right;  POSTERIOR CERVICAL FUSION/FORAMINOTOMY N/A 12/31/2012  Procedure: POSTERIOR LATERAL CERVICAL FUSION/FORAMINOTOMY LEVEL  1 CERVICAL THREE-FOUR WITH LATERAL MASS SCREWS;  Surgeon: Catalina CHRISTELLA Leeann, MD;  Location: [facility];  Service: Neurosurgery;  Laterality: N/A;  PTOSIS REPAIR Bilateral 10/02/2016  Procedure: INTERNAL PTOSIS REPAIR;  Surgeon: Laurie Loyd Redhead, MD;  Location: [facility];  Service: Plastics;  Laterality: Bilateral;  SKIN BIOPSY    THYROID  SURGERY    R lobe removed- 1972, has grown back - CT last done- 2018  TOTAL HIP ARTHROPLASTY  12/22/2011  Procedure: TOTAL HIP ARTHROPLASTY;  Surgeon: Dempsey JINNY Sensor, MD;  Location: [facility];  Service: Orthopedics;  Laterality: Left;  TOTAL HIP ARTHROPLASTY Right 11/12/2023  Procedure: ARTHROPLASTY, HIP, TOTAL, ANTERIOR APPROACH;  Surgeon: Fidel Rogue, MD;  Location: [facility];  Service: Orthopedics;  Laterality: Right;  TOTAL SHOULDER ARTHROPLASTY    TUBAL LIGATION    UPPER GASTROINTESTINAL ENDOSCOPY  2012  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   Subjective:    Sherri [name] no complaints  Objective:    Vitals:   11/30/23 2015 11/30/23 2034 12/01/23 0525 12/01/23 0627  BP: (!) 140/84 120/70 114/72 114/70  Pulse: 91 98 (!) 114 98  Resp: 10 14 16 18   Temp: 98.7 F (37.1 C) 97.8 F (36.6 C) (!) 97.4 F (36.3 C) 98 F (36.7 C)  TempSrc:  Oral Oral Oral  SpO2: 96% 99% 98%  99%  Weight:      Height:       SpO2: 99 % O2 Flow Rate (L/min): 6 L/min   Intake/Output Summary (Last 24 hours) at 12/01/2023 0856 Last data filed at 12/01/2023 0818 Gross per 24 hour  Intake 600 ml  Output 202 ml  Net 398 ml   Filed Weights   11/26/23 2012 11/28/23 1238  Weight: 48.6 kg 48.6 kg    Exam: General exam: In no acute distress. Respiratory system: Good air movement and clear to auscultation. Cardiovascular system: S1 & S2 heard, RRR. No JVD. Gastrointestinal system: Abdomen is nondistended,  soft and nontender.  Extremities: No pedal edema. Skin: No rashes, lesions or ulcers Psychiatry: Judgement and insight appear normal. Mood & affect appropriate.  Data Reviewed:    Labs: Basic Metabolic Panel: Recent Labs  Lab 11/27/23 0015 11/27/23 0539 11/28/23 0419 11/29/23 0502 11/30/23 0508  NA 129* 131* 133* 133* 135  K 3.6 3.3* 3.4* 4.7 3.7  CL 94* 95* 99 102 101  CO2 24 29 27 24 25   GLUCOSE 103* 80 85 132* 69*  BUN 10 9 7* 5* <5*  CREATININE 0.68 0.60 0.65 0.53 0.62  CALCIUM  7.8* 7.6* 7.6* 7.6* 8.0*   GFR Estimated Creatinine Clearance: 41.6 mL/min (by C-G formula based on SCr of 0.62 mg/dL). Liver Function Tests: No results for input(s): AST, ALT, ALKPHOS, BILITOT, PROT, ALBUMIN  in the last 168 hours. No results for input(s): LIPASE, AMYLASE in the last 168 hours. No results for input(s): AMMONIA in the last 168 hours. Coagulation profile No results for input(s): INR, PROTIME in the last 168 hours. COVID-19 Labs  No results for input(s): DDIMER, FERRITIN, LDH, CRP in the last 72 hours.   No results found for: SARSCOV2NAA  CBC: Recent Labs  Lab 11/27/23 0015 11/27/23 0539 11/29/23 0648 11/29/23 2048 11/30/23 1005  WBC 11.8* 9.5 13.1*  --  9.1  NEUTROABS  --  7.7  --   --  7.8*  HGB 8.1* 8.1* 6.6* 10.0* 10.8*  HCT 25.5* 24.7* 20.8* 31.2* 33.2*  MCV 107.1* 102.1* 107.8*  --  99.4  PLT 351 354 322  --  309   Cardiac Enzymes: No results for input(s): CKTOTAL, CKMB, CKMBINDEX, TROPONINI in the last 168 hours. BNP (last 3 results) No results for input(s): PROBNP in the last 8760 hours. CBG: No results for input(s): GLUCAP in the last 168 hours.  D-Dimer: No results for input(s): DDIMER in the last 72 hours. Hgb A1c: No results for input(s): HGBA1C in the last 72 hours. Lipid Profile: No results for input(s): CHOL, HDL, LDLCALC, TRIG, CHOLHDL, LDLDIRECT in the last 72 hours. Thyroid   function studies: No results for input(s): TSH, T4TOTAL, T3FREE, THYROIDAB in the last 72 hours.  Invalid input(s): FREET3  Anemia work up: No results for input(s): VITAMINB12, FOLATE, FERRITIN, TIBC, IRON, RETICCTPCT in the last 72 hours.  Sepsis Labs: Recent Labs  Lab 11/27/23 0015 11/27/23 0539 11/29/23 0648 11/30/23 1005  WBC 11.8* 9.5 13.1* 9.1   Microbiology Recent Results (from the past 240 hours)  Culture, blood (Routine X 2) w Reflex to ID Panel     Status: None (Preliminary result)   Collection Time: 11/27/23  8:26 AM   Specimen: BLOOD RIGHT HAND  Result Value Ref Range Status   Specimen Description BLOOD RIGHT HAND  Final   Special Requests   Final    BOTTLES DRAWN AEROBIC ONLY Blood Culture results may not be optimal due to an inadequate volume  of blood received in culture bottles   Culture   Final    NO GROWTH 4 DAYS Performed at [facility], [address], [place], [place] [address]    Report Status PENDING  Incomplete  Culture, blood (Routine X 2) w Reflex to ID Panel     Status: None (Preliminary result)   Collection Time: 11/27/23  8:27 AM   Specimen: BLOOD LEFT ARM  Result Value Ref Range Status   Specimen Description BLOOD LEFT ARM  Final   Special Requests   Final    BOTTLES DRAWN AEROBIC AND ANAEROBIC Blood Culture adequate volume   Culture   Final    NO GROWTH 4 DAYS Performed at [facility], [address] [place], [place] [address]    Report Status PENDING  Incomplete     Medications:    aspirin  EC  81 mg Oral BID   atorvastatin   10 mg Oral Daily   carvedilol   3.125 mg Oral BID WC   cefUROXime   500 mg Oral BID WC   Chlorhexidine  Gluconate Cloth  6 each Topical Daily   cyclobenzaprine   10 mg Oral QHS   docusate sodium   100 mg Oral Daily   doxycycline   100 mg Oral Q12H   DULoxetine   20 mg Oral QHS   folic acid   1 mg Oral Daily   pantoprazole   40 mg Oral BID AC   predniSONE   5 mg Oral Q breakfast    senna  1 tablet Oral BID   Continuous Infusions:      LOS: 4 days   Erle Odell Castor  [facility]  12/01/2023, 8:56 AM

## 2023-12-01 NOTE — Progress Notes (Signed)
 2 Day Post-Op Procedure(s) (LRB): IRRIGATION AND DEBRIDEMENT RIGHT ELBOW  Subjective: Patient reports pain as mild about the right elbow.  Right extremity in splint, though patient has been attempting to flex her right arm somewhat frequently.  A little drowsy this morning.  Nursing assistance at bedside.  Denies numbness or tingling of right arm.  Splint feels okay.  PT was deferred yesterday due to hip issue.  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  She was taken to the OR last night and was reduced by Dr. Fidel.  Objective:   VITALS:   Vitals:   11/30/23 2015 11/30/23 2034 12/01/23 0525 12/01/23 0627  BP: (!) 140/84 120/70 114/72 114/70  Pulse: 91 98 (!) 114 98  Resp: 10 14 16 18   Temp: 98.7 F (37.1 C) 97.8 F (36.6 C) (!) 97.4 F (36.3 C) 98 F (36.7 C)  TempSrc:  Oral Oral Oral  SpO2: 96% 99% 98% 99%  Weight:      Height:        Resting comfortably in bed in NAD Sensation intact distally Intact pulses distally Compartment soft Splint clean, dry, and intact Wiggles fingers appropriately No significant swelling adjacent to splint (proximally or distally), and splint understandably limits visual examination of RUE   Lab Results  Component Value Date   WBC 9.1 11/30/2023   HGB 10.8 (L) 11/30/2023   HCT 33.2 (L) 11/30/2023   MCV 99.4 11/30/2023   PLT 309 11/30/2023   BMET    Component Value Date/Time   NA 135 11/30/2023 0508   NA 139 06/20/2019 1335   NA 136 02/22/2015 0000   K 3.7 11/30/2023 0508   K 4.4 02/22/2015 0000   CL 101 11/30/2023 0508   CL 98 02/22/2015 0000   CO2 25 11/30/2023 0508   CO2 30 02/22/2015 0000   GLUCOSE 69 (L) 11/30/2023 0508   BUN <5 (L) 11/30/2023 0508   BUN 10 06/20/2019 1335   CREATININE 0.62 11/30/2023 0508   CREATININE 1.03 (H) 08/06/2023 1510   CALCIUM  8.0 (L) 11/30/2023 0508   CALCIUM  9.6 02/22/2015 0000   EGFR 55 (L) 08/06/2023 1510    GFRNONAA >60 11/30/2023 0508   GFRNONAA 56 (L) 09/26/2020 1536    Xray: Postop x-rays show interval removal of hardware with resection of olecranon fragment   Assessment/Plan: 1 Day Post-Op   Principal Problem:   Wound dehiscence Active Problems:   Hyperlipidemia   Multiple sclerosis (HCC)   Essential hypertension   Rheumatoid arthritis (HCC)   Hyponatremia   Macrocytic anemia   Cellulitis of right elbow   Status post right elbow hardware removal with olecranon excision and triceps advancement 11/28/2023  12/01/23: Patient resting.  Looking to resume PT today.  Had hip reduced with Dr. Fidel in OR yesterday see notes.  May resplint RUE for improved flexion prevention.  Discussed with Dr. Edna.  Likely SNF   Post op recs: WB: Immobilized in a long-arm splint, okay to use a walker for weightbearing with the right upper extremity Abx: ancef  periop and ceftriaxone  post op then dc on keflex  Imaging: PACU xrays DVT prophylaxis: Aspirin  81mg  daily for 4 weeks Follow up: 10-14 days after surgery for a wound check with Dr. Edna at [facility].  Address: [address], [place], [place] [address]  Office Phone: [phone]    Sherri [name] 12/01/2023, 7:05 AM     Contact information:  Weekdays 7am-5pm epic message Dr. Edna, or call office for patient follow up: [phone] After hours and holidays please check [email or web address] for group call information for [facility]

## 2023-12-01 NOTE — Plan of Care (Signed)

## 2023-12-01 NOTE — Progress Notes (Signed)
 Speech Language Pathology Treatment: Dysphagia  Patient Details Name: Sherri  AUJANAE [name] MRN: [id number] DOB: 05-30-1940 Today's Date: 12/01/2023 Time: 9054-8975 SLP Time Calculation (min) (ACUTE ONLY): 39 min  Assessment / Plan / Recommendation Clinical Impression  Explained result of MBS to pt and son, daughter also on speaker phone. Everyone agreed that this was not surprising as pt had been reporting dysphagia to MBS since December and had been observed to struggle. SLP reiterated that in this case, only thickening to nectar prevented significant aspiration and given pts acute deconditioning and plan for ongoing surgery, it is very important to both maintain good nutrition, but avoid aspiration. Pt tried thickened water  and coffee and did not mind the change. Family reports pt will return home with caregivers. SLP introduced plan for thickened, how to purchase, plan for therapy to improve strength and potentially a water  protocol as needed. Will discuss this more in a further session. Pt is already avoiding textures of solids she finds problematic, so will continue regular texture foods for maximal choice (MBS recommended IDDSI 5).   HPI HPI: Pt is an 83 y.o. female who presented to the ED 7/4 due to concerns for exposed surgical hardware from previous elbow surgery in June. SLP consulted due to RN's observance of coughing with liquids. PMH: hypertension, rheumatoid arthritis, chronic pain, urinary retention.      SLP Plan             Recommendations  Diet recommendations: Regular;Nectar-thick liquid Liquids provided via: Cup;Straw Medication Administration: Whole meds with puree Supervision: Patient able to self feed Compensations: Slow rate;Small sips/bites;Follow solids with liquid Postural Changes and/or Swallow Maneuvers: Seated upright 90 degrees                  Oral care BID     Dysphagia, unspecified (R13.10)           Kayleen Alig, Consuelo Fitch  12/01/2023, 10:30  AM

## 2023-12-01 NOTE — Progress Notes (Signed)
 PT Cancellation Note and Discharge  Patient Details Name: Sherri [name] MRN: [id number] DOB: [birth year]   Cancelled Treatment:    Reason Eval/Treat Not Completed: Medical issues which prohibited therapy. Pt continues to be medically inappropriate to initiate PT evaluation. Noted pt pending surgery at this time. PT will sign off. Please reorder when pt is medically ready to begin mobilization.    Sherri [name] 12/01/2023, 1:42 PM  Sherri [name], PT, DPT Acute Rehabilitation Services Secure Chat Preferred Office: [phone]

## 2023-12-02 DIAGNOSIS — T8130XA Disruption of wound, unspecified, initial encounter: Secondary | ICD-10-CM | POA: Diagnosis not present

## 2023-12-02 LAB — CULTURE, BLOOD (ROUTINE X 2)
Culture: NO GROWTH
Culture: NO GROWTH
Special Requests: ADEQUATE

## 2023-12-02 NOTE — Plan of Care (Signed)

## 2023-12-02 NOTE — Progress Notes (Signed)
 PROGRESS NOTE    Sherri  Namiko Pritts [name]  FMW:[id number] DOB: February 12, 1941 DOA: 11/26/2023 PCP: [name] PARAS, MD  No chief complaint on file.   Brief Narrative:   Sherri  Tieshia Rettinger [name] is an 83 y.o. 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   Assessment & Plan:   Principal Problem:   Wound dehiscence Active Problems:   Hyponatremia   Hyperlipidemia   Multiple sclerosis (HCC)   Essential hypertension   Rheumatoid arthritis (HCC)   Macrocytic anemia   Cellulitis of right elbow  Right elbow visible hardware with Wound dehiscence with cellulitis: Orthopedic surgery to recommend further antibiotics. Status post I&D of the right elbow on 11/27/2023 and hardware removal. Blood cx NGTD x5  on oral Doxy and cefuroxime , plan for 4 to 6 weeks. Orthopedic surgery recommended aspirin  for 4 weeks. Follow-up with orthopedic surgery in 2 weeks. PT eval is pending.   Superior Dislocation of R hip Arthroplasty S/p closed reduction on 7/7 Planning for revision of R THA 7/10 at [facility]  Hypovolemic hyponatremia: trend   Hypokalemia: Replete IV recheck in the morning   Macrocytic anemia/acute blood loss anemia Normal B12, folate Iron studies suggestive of AOCD   Rheumatoid arthritis: Continue methotrexate  and prednisone . Will have low threshold to give her stress dose steroids in the setting of possible infection.   History of multiple sclerosis: She is no longer on these medication.   Chronic pain: Continue Cymbalta .   Urinary retention: Continue oxybutynin .   History of recent right hip surgery in May 2025: She will need skilled nursing  facility.   Diffuse Ecchymoses Bruises all over Platelets ok, will monitor Continue to monitor, may need follow up with derm  Goals of care: Seen by palliative 7/5, they've signed off    DVT prophylaxis: SCD Code Status: full Family Communication: son at bedside, daughter over phone Disposition:   Status is: Inpatient Remains inpatient appropriate because: need for ongoing care   Consultants:  orthopedics  Procedures:  7/7 Closed reduction right hip.   7/5  Irrigation debridement of right elbow with hardware removal olecranon excision and triceps advancement   Antimicrobials:  Anti-infectives (From admission, onward)    Start     Dose/Rate Route Frequency Ordered Stop   11/30/23 1700  cefUROXime  (CEFTIN ) tablet 500 mg        500 mg Oral 2 times daily with meals 11/30/23 1021 12/14/23 1659   11/30/23 1100  doxycycline  (VIBRA -TABS) tablet 100 mg  Status:  Discontinued        100 mg Oral Every 12 hours 11/30/23 1005 11/30/23 1021   11/30/23 1100  doxycycline  (VIBRA -TABS) tablet 100 mg        100 mg Oral Every 12 hours 11/30/23 1021 12/14/23 0959   11/28/23 2200  ceFAZolin  (ANCEF ) IVPB 2g/100 mL premix        2 g 200 mL/hr over 30 Minutes Intravenous Every 8 hours 11/28/23 1714 11/29/23 0558   11/28/23 1453  vancomycin  (VANCOCIN ) powder  Status:  Discontinued          As needed 11/28/23 1453 11/28/23 1553   11/28/23 1315  ceFAZolin  (ANCEF ) IVPB 2g/100 mL premix        2 g 200 mL/hr over  30 Minutes Intravenous  Once 11/28/23 1303 11/28/23 1421   11/28/23 1315  vancomycin  (VANCOCIN ) IVPB 1000 mg/200 mL premix  Status:  Discontinued        1,000 mg 200 mL/hr over 60 Minutes Intravenous  Once 11/28/23 1303 11/28/23 1645   11/28/23 0000  cefTRIAXone  (ROCEPHIN ) 1 g in sodium chloride  0.9 % 100 mL IVPB  Status:  Discontinued        1 g 200 mL/hr over 30 Minutes Intravenous Every 24 hours 11/27/23 0702 11/27/23 0856   11/27/23 1100  ceFAZolin  (ANCEF ) IVPB 2g/100 mL premix   Status:  Discontinued        2 g 200 mL/hr over 30 Minutes Intravenous On call to O.R. 11/27/23 0216 11/28/23 0559   11/27/23 1100  vancomycin  (VANCOCIN ) IVPB 1000 mg/200 mL premix  Status:  Discontinued        1,000 mg 200 mL/hr over 60 Minutes Intravenous On call to O.R. 11/27/23 0216 11/28/23 0559   11/27/23 0900  cefTRIAXone  (ROCEPHIN ) 1 g in sodium chloride  0.9 % 100 mL IVPB  Status:  Discontinued        1 g 200 mL/hr over 30 Minutes Intravenous Every 24 hours 11/27/23 0856 11/30/23 1050   11/27/23 0600  cephALEXin  (KEFLEX ) capsule 500 mg  Status:  Discontinued        500 mg Oral 3 times daily 11/27/23 0519 11/27/23 0702   11/27/23 0430  ceFAZolin  (ANCEF ) IVPB 2g/100 mL premix  Status:  Discontinued        2 g 200 mL/hr over 30 Minutes Intravenous  Once 11/27/23 0422 11/27/23 0517       Subjective: No new complaints Son at bedsdie  Objective: Vitals:   12/01/23 2027 12/02/23 0456 12/02/23 0759 12/02/23 1557  BP: 110/79 (!) 147/70 (!) 146/70 98/60  Pulse: (!) 106 (!) 101 100 96  Resp: 18 19 16 16   Temp: 98 F (36.7 C) 98 F (36.7 C) (!) 97.5 F (36.4 C) 98.6 F (37 C)  TempSrc:      SpO2: 98% 95% 97% 100%  Weight:      Height:        Intake/Output Summary (Last 24 hours) at 12/02/2023 1844 Last data filed at 12/02/2023 1700 Gross per 24 hour  Intake 340 ml  Output 1900 ml  Net -1560 ml   Filed Weights   11/26/23 2012 11/28/23 1238  Weight: 48.6 kg 48.6 kg    Examination:  General exam: Appears calm and comfortable  Respiratory system: unlabored Cardiovascular system: RRR Gastrointestinal system: Abdomen is nondistended, soft and nontender. Central nervous system: Alert and oriented. No focal neurological deficits. Extremities: no LEE, RUE in splint Skin: diffuse ecchymoses  Psychiatry: Judgement and insight appear normal. Mood & affect appropriate.     Data Reviewed: I have personally reviewed following labs and imaging studies  CBC: Recent Labs   Lab 11/27/23 0015 11/27/23 0539 11/29/23 0648 11/29/23 2048 11/30/23 1005  WBC 11.8* 9.5 13.1*  --  9.1  NEUTROABS  --  7.7  --   --  7.8*  HGB 8.1* 8.1* 6.6* 10.0* 10.8*  HCT 25.5* 24.7* 20.8* 31.2* 33.2*  MCV 107.1* 102.1* 107.8*  --  99.4  PLT 351 354 322  --  309    Basic Metabolic Panel: Recent Labs  Lab 11/27/23 0015 11/27/23 0539 11/28/23 0419 11/29/23 0502 11/30/23 0508  NA 129* 131* 133* 133* 135  K 3.6 3.3* 3.4* 4.7 3.7  CL 94* 95* 99 102 101  CO2 24 29 27 24 25   GLUCOSE 103* 80 85 132* 69*  BUN 10 9 7* 5* <5*  CREATININE 0.68 0.60 0.65 0.53 0.62  CALCIUM  7.8* 7.6* 7.6* 7.6* 8.0*    GFR: Estimated Creatinine Clearance: 41.6 mL/min (by C-G formula based on SCr of 0.62 mg/dL).  Liver Function Tests: No results for input(s): AST, ALT, ALKPHOS, BILITOT, PROT, ALBUMIN  in the last 168 hours.  CBG: No results for input(s): GLUCAP in the last 168 hours.   Recent Results (from the past 240 hours)  Culture, blood (Routine X 2) w Reflex to ID Panel     Status: None   Collection Time: 11/27/23  8:26 AM   Specimen: BLOOD RIGHT HAND  Result Value Ref Range Status   Specimen Description BLOOD RIGHT HAND  Final   Special Requests   Final    BOTTLES DRAWN AEROBIC ONLY Blood Culture results may not be optimal due to an inadequate volume of blood received in culture bottles   Culture   Final    NO GROWTH 5 DAYS Performed at [facility], [address] [place], [place] [address]    Report Status 12/02/2023 FINAL  Final  Culture, blood (Routine X 2) w Reflex to ID Panel     Status: None   Collection Time: 11/27/23  8:27 AM   Specimen: BLOOD LEFT ARM  Result Value Ref Range Status   Specimen Description BLOOD LEFT ARM  Final   Special Requests   Final    BOTTLES DRAWN AEROBIC AND ANAEROBIC Blood Culture adequate volume   Culture   Final    NO GROWTH 5 DAYS Performed at [facility], [address] [place], [place] [address]     Report Status 12/02/2023 FINAL  Final         Radiology Studies: DG HIP UNILAT WITH PELVIS 1V RIGHT Result Date: 11/30/2023 CLINICAL DATA:  Elective surgery.  Closed reduction of right hip. EXAM: DG HIP (WITH OR WITHOUT PELVIS) 1V RIGHT COMPARISON:  Pelvis radiograph earlier today FINDINGS: Persistent superior dislocation of the femoral component of right hip arthroplasty. The femoral component may be slightly more laterally displaced than on prior exam. Overlying skin staples remain in place. Barium previously in the small bowel has progressed to the colon. 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 The femoral component may be slightly more laterally displaced than on prior exam. Electronically Signed   By: Andrea Gasman M.D.   On: 11/30/2023 19:54        Scheduled Meds:  aspirin  EC  81 mg Oral BID   atorvastatin   10 mg Oral Daily   carvedilol   3.125 mg Oral BID WC   cefUROXime   500 mg Oral BID WC   Chlorhexidine  Gluconate Cloth  6 each Topical Daily   cyclobenzaprine   10 mg Oral QHS   docusate sodium   100 mg Oral Daily   doxycycline   100 mg Oral Q12H   DULoxetine   20 mg Oral QHS   feeding supplement  237 mL Oral BID BM   folic acid   1 mg Oral Daily   pantoprazole   40 mg Oral BID AC   predniSONE   5 mg Oral Q breakfast   senna  1 tablet Oral BID   Continuous Infusions:   LOS: 5 days    Time spent: over 30 min    Meliton Monte, MD [facility]   To contact the attending provider between 7A-7P or the  covering provider during after hours 7P-7A, please log into the web site [email or web address] and access using universal [place] password for that web site. If you do not have the password, please call the hospital operator.  12/02/2023, 6:44 PM

## 2023-12-02 NOTE — Plan of Care (Signed)
  Problem: Education: Goal: Knowledge of General Education information will improve Description: Including pain rating scale, medication(s)/side effects and non-pharmacologic comfort measures Outcome: Progressing   Problem: Clinical Measurements: Goal: Diagnostic test results will improve Outcome: Progressing   Problem: Coping: Goal: Level of anxiety will decrease Outcome: Progressing   Problem: Skin Integrity: Goal: Risk for impaired skin integrity will decrease Outcome: Progressing

## 2023-12-02 NOTE — Progress Notes (Signed)
 Orthopedic Tech Progress Note Patient Details:  Sherri [name] February 17, 1941 [id number]  PA came from [place] so we could change her dressing and reapply better fitting splint to arm, husband at bedside  Ortho Devices Type of Ortho Device: Ace wrap, Cotton web roll, Long arm splint, Post (short arm) splint Ortho Device/Splint Location: RUE Ortho Device/Splint Interventions: Ordered, Application, Adjustment   Post Interventions Patient Tolerated: Well Instructions Provided: Care of device  Delanna LITTIE Pac 12/02/2023, 12:24 PM

## 2023-12-02 NOTE — Progress Notes (Signed)
 SLP Cancellation Note  Patient Details Name: Sherri [name] MRN: [id number] DOB: [birth year]   Cancelled treatment:       Reason Eval/Treat Not Completed: Fatigue/lethargy limiting ability to participate Pt sleeping deeply. Son at bedside reports he hasn't wanted to wake her today; she has been a bit more confused. Left RMT, exercise sheet and ball for CTAR in room. Pt will need f/u after surgery - concerned her diet will need modification and her function may also be worse.   Lachlan Mckim, Consuelo Fitch 12/02/2023, 11:28 AM

## 2023-12-02 NOTE — Progress Notes (Signed)
 Patient has failed R THA due to ppx dislocation. Plan for revision R THA tomorrow at [facility].  Will need carelink xfer to [facility] short stay. Discussed R/B/A with the patient's daughter, Luke. Will reach out to Dr. Perri w/ [facility] as well.  NPO after MN.

## 2023-12-03 ENCOUNTER — Inpatient Hospital Stay (HOSPITAL_COMMUNITY): Admitting: Anesthesiology

## 2023-12-03 ENCOUNTER — Other Ambulatory Visit: Payer: Self-pay

## 2023-12-03 ENCOUNTER — Inpatient Hospital Stay (HOSPITAL_COMMUNITY)

## 2023-12-03 ENCOUNTER — Encounter (HOSPITAL_COMMUNITY)
Admission: EM | Disposition: A | Discharge status: Rehabilitation Facility | Payer: Self-pay | Source: Home / Self Care | Attending: Family Medicine

## 2023-12-03 ENCOUNTER — Encounter (HOSPITAL_COMMUNITY): Payer: Self-pay | Admitting: Internal Medicine

## 2023-12-03 DIAGNOSIS — I10 Essential (primary) hypertension: Secondary | ICD-10-CM | POA: Diagnosis not present

## 2023-12-03 DIAGNOSIS — T8130XA Disruption of wound, unspecified, initial encounter: Secondary | ICD-10-CM | POA: Diagnosis not present

## 2023-12-03 DIAGNOSIS — G35 Multiple sclerosis: Secondary | ICD-10-CM

## 2023-12-03 DIAGNOSIS — Z96641 Presence of right artificial hip joint: Secondary | ICD-10-CM | POA: Diagnosis not present

## 2023-12-03 DIAGNOSIS — Z87891 Personal history of nicotine dependence: Secondary | ICD-10-CM

## 2023-12-03 DIAGNOSIS — T84020A Dislocation of internal right hip prosthesis, initial encounter: Secondary | ICD-10-CM | POA: Diagnosis not present

## 2023-12-03 HISTORY — PX: TOTAL HIP REVISION: SHX763

## 2023-12-03 LAB — CBC WITH DIFFERENTIAL/PLATELET
Abs Immature Granulocytes: 0.3 K/uL — ABNORMAL HIGH (ref 0.00–0.07)
Basophils Absolute: 0 K/uL (ref 0.0–0.1)
Basophils Relative: 1 %
Eosinophils Absolute: 0.2 K/uL (ref 0.0–0.5)
Eosinophils Relative: 3 %
HCT: 27.2 % — ABNORMAL LOW (ref 36.0–46.0)
Hemoglobin: 8.8 g/dL — ABNORMAL LOW (ref 12.0–15.0)
Immature Granulocytes: 5 %
Lymphocytes Relative: 13 %
Lymphs Abs: 0.8 K/uL (ref 0.7–4.0)
MCH: 32.2 pg (ref 26.0–34.0)
MCHC: 32.4 g/dL (ref 30.0–36.0)
MCV: 99.6 fL (ref 80.0–100.0)
Monocytes Absolute: 0.7 K/uL (ref 0.1–1.0)
Monocytes Relative: 12 %
Neutro Abs: 4.1 K/uL (ref 1.7–7.7)
Neutrophils Relative %: 66 %
Platelets: 283 K/uL (ref 150–400)
RBC: 2.73 MIL/uL — ABNORMAL LOW (ref 3.87–5.11)
RDW: 17.8 % — ABNORMAL HIGH (ref 11.5–15.5)
WBC: 6.1 K/uL (ref 4.0–10.5)
nRBC: 0 % (ref 0.0–0.2)

## 2023-12-03 LAB — COMPREHENSIVE METABOLIC PANEL WITH GFR
ALT: 9 U/L (ref 0–44)
AST: 19 U/L (ref 15–41)
Albumin: 2.1 g/dL — ABNORMAL LOW (ref 3.5–5.0)
Alkaline Phosphatase: 57 U/L (ref 38–126)
Anion gap: 8 (ref 5–15)
BUN: 6 mg/dL — ABNORMAL LOW (ref 8–23)
CO2: 27 mmol/L (ref 22–32)
Calcium: 8 mg/dL — ABNORMAL LOW (ref 8.9–10.3)
Chloride: 101 mmol/L (ref 98–111)
Creatinine, Ser: 0.64 mg/dL (ref 0.44–1.00)
GFR, Estimated: >60 mL/min (ref >60)
Glucose, Bld: 90 mg/dL (ref 70–99)
Potassium: 3.8 mmol/L (ref 3.5–5.1)
Sodium: 136 mmol/L (ref 135–145)
Total Bilirubin: 0.8 mg/dL (ref 0.0–1.2)
Total Protein: 4.9 g/dL — ABNORMAL LOW (ref 6.5–8.1)

## 2023-12-03 LAB — TYPE AND SCREEN
ABO/RH(D): A POS
Antibody Screen: NEGATIVE

## 2023-12-03 LAB — MAGNESIUM: Magnesium: 1.8 mg/dL (ref 1.7–2.4)

## 2023-12-03 LAB — PHOSPHORUS: Phosphorus: 2.9 mg/dL (ref 2.5–4.6)

## 2023-12-03 LAB — SURGICAL PCR SCREEN
MRSA, PCR: NEGATIVE
Staphylococcus aureus: NEGATIVE

## 2023-12-03 SURGERY — TOTAL HIP REVISION
Anesthesia: General | Site: Hip | Laterality: Right

## 2023-12-03 MED ORDER — CEFAZOLIN SODIUM-DEXTROSE 2-4 GM/100ML-% IV SOLN
INTRAVENOUS | Status: AC
  Filled 2023-12-03: qty 100

## 2023-12-03 MED ORDER — VANCOMYCIN HCL IN DEXTROSE 1-5 GM/200ML-% IV SOLN: 1000.0000 mg | INTRAVENOUS | Status: AC

## 2023-12-03 MED ORDER — VANCOMYCIN HCL IN DEXTROSE 1-5 GM/200ML-% IV SOLN
INTRAVENOUS | Status: AC
  Administered 2023-12-03: 1000 mg via INTRAVENOUS
  Filled 2023-12-03: qty 200

## 2023-12-03 MED ORDER — CEFAZOLIN SODIUM-DEXTROSE 2-4 GM/100ML-% IV SOLN
2.0000 g | INTRAVENOUS | Status: AC
Start: 2023-12-03 — End: 2023-12-03
  Administered 2023-12-03: 2 g via INTRAVENOUS

## 2023-12-03 MED ORDER — 0.9 % SODIUM CHLORIDE (POUR BTL) OPTIME
TOPICAL | Status: DC | PRN
  Administered 2023-12-03: 1000 mL

## 2023-12-03 MED ORDER — VANCOMYCIN HCL 1000 MG IV SOLR
INTRAVENOUS | Status: DC | PRN
  Administered 2023-12-03: 1000 mg via TOPICAL

## 2023-12-03 MED ORDER — PHENYLEPHRINE 80 MCG/ML (10ML) SYRINGE FOR IV PUSH (FOR BLOOD PRESSURE SUPPORT)
PREFILLED_SYRINGE | INTRAVENOUS | Status: AC
Start: 2023-12-03 — End: 2023-12-03
  Filled 2023-12-03: qty 10

## 2023-12-03 MED ORDER — TRANEXAMIC ACID-NACL 1000-0.7 MG/100ML-% IV SOLN
1000.0000 mg | INTRAVENOUS | Status: AC
  Administered 2023-12-03: 1000 mg via INTRAVENOUS

## 2023-12-03 MED ORDER — STERILE WATER FOR IRRIGATION IR SOLN
Status: DC | PRN
  Administered 2023-12-03: 2000 mL

## 2023-12-03 MED ORDER — LACTATED RINGERS IV SOLN: INTRAVENOUS | Status: DC

## 2023-12-03 MED ORDER — POVIDONE-IODINE 10 % EX SWAB: 2.0000 | Freq: Once | CUTANEOUS | Status: DC

## 2023-12-03 MED ORDER — ROCURONIUM BROMIDE 10 MG/ML (PF) SYRINGE
PREFILLED_SYRINGE | INTRAVENOUS | Status: AC
  Filled 2023-12-03: qty 10

## 2023-12-03 MED ORDER — DROPERIDOL 2.5 MG/ML IJ SOLN: 0.6250 mg | Freq: Once | INTRAMUSCULAR | Status: DC | PRN

## 2023-12-03 MED ORDER — TRANEXAMIC ACID-NACL 1000-0.7 MG/100ML-% IV SOLN
INTRAVENOUS | Status: AC
  Filled 2023-12-03: qty 100

## 2023-12-03 MED ORDER — SODIUM CHLORIDE (PF) 0.9 % IJ SOLN
INTRAMUSCULAR | Status: AC
Start: 2023-12-03 — End: 2023-12-03
  Filled 2023-12-03: qty 30

## 2023-12-03 MED ORDER — DEXAMETHASONE SODIUM PHOSPHATE 10 MG/ML IJ SOLN
INTRAMUSCULAR | Status: DC | PRN
  Administered 2023-12-03: 5 mg via INTRAVENOUS

## 2023-12-03 MED ORDER — MUPIROCIN 2 % EX OINT
1.0000 | TOPICAL_OINTMENT | Freq: Two times a day (BID) | CUTANEOUS | Status: DC
  Administered 2023-12-03 – 2023-12-04 (×3): 1 via NASAL
  Filled 2023-12-03 (×2): qty 22

## 2023-12-03 MED ORDER — VANCOMYCIN HCL 1000 MG IV SOLR
INTRAVENOUS | Status: AC
  Filled 2023-12-03: qty 20

## 2023-12-03 MED ORDER — SODIUM CHLORIDE 0.9 % IR SOLN
Status: DC | PRN
  Administered 2023-12-03: 3000 mL
  Administered 2023-12-03: 250 mL

## 2023-12-03 MED ORDER — LIDOCAINE HCL (PF) 2 % IJ SOLN
INTRAMUSCULAR | Status: AC
  Filled 2023-12-03: qty 5

## 2023-12-03 MED ORDER — DEXAMETHASONE SODIUM PHOSPHATE 10 MG/ML IJ SOLN
INTRAMUSCULAR | Status: AC
Start: 2023-12-03 — End: 2023-12-03
  Filled 2023-12-03: qty 1

## 2023-12-03 MED ORDER — ISOPROPYL ALCOHOL 70 % SOLN
Status: DC | PRN
  Administered 2023-12-03: 1 via TOPICAL

## 2023-12-03 MED ORDER — FENTANYL CITRATE (PF) 100 MCG/2ML IJ SOLN
INTRAMUSCULAR | Status: AC
  Filled 2023-12-03: qty 2

## 2023-12-03 MED ORDER — SUGAMMADEX SODIUM 200 MG/2ML IV SOLN
INTRAVENOUS | Status: DC | PRN
  Administered 2023-12-03: 200 mg via INTRAVENOUS

## 2023-12-03 MED ORDER — FENTANYL CITRATE PF 50 MCG/ML IJ SOSY
50.0000 ug | PREFILLED_SYRINGE | Freq: Once | INTRAMUSCULAR | Status: AC
  Administered 2023-12-03: 50 ug via INTRAVENOUS
  Filled 2023-12-03: qty 1

## 2023-12-03 MED ORDER — CHLORHEXIDINE GLUCONATE 4 % EX SOLN: 60.0000 mL | Freq: Once | CUTANEOUS | Status: DC

## 2023-12-03 MED ORDER — PROPOFOL 10 MG/ML IV BOLUS
INTRAVENOUS | Status: DC | PRN
  Administered 2023-12-03: 70 mg via INTRAVENOUS

## 2023-12-03 MED ORDER — ROCURONIUM BROMIDE 10 MG/ML (PF) SYRINGE
PREFILLED_SYRINGE | INTRAVENOUS | Status: DC | PRN
  Administered 2023-12-03: 50 mg via INTRAVENOUS

## 2023-12-03 MED ORDER — FENTANYL CITRATE PF 50 MCG/ML IJ SOSY
PREFILLED_SYRINGE | INTRAMUSCULAR | Status: AC
  Filled 2023-12-03: qty 1

## 2023-12-03 MED ORDER — KETOROLAC TROMETHAMINE 30 MG/ML IJ SOLN
INTRAMUSCULAR | Status: AC
  Filled 2023-12-03: qty 1

## 2023-12-03 MED ORDER — ONDANSETRON HCL 4 MG/2ML IJ SOLN
INTRAMUSCULAR | Status: AC
  Filled 2023-12-03: qty 2

## 2023-12-03 MED ORDER — FENTANYL CITRATE (PF) 100 MCG/2ML IJ SOLN
INTRAMUSCULAR | Status: DC | PRN
  Administered 2023-12-03 (×2): 100 ug via INTRAVENOUS

## 2023-12-03 MED ORDER — BUPIVACAINE-EPINEPHRINE (PF) 0.25% -1:200000 IJ SOLN
INTRAMUSCULAR | Status: AC
  Filled 2023-12-03: qty 30

## 2023-12-03 MED ORDER — OXYCODONE HCL 5 MG/5ML PO SOLN: 5.0000 mg | Freq: Once | ORAL | Status: DC | PRN

## 2023-12-03 MED ORDER — PHENYLEPHRINE 80 MCG/ML (10ML) SYRINGE FOR IV PUSH (FOR BLOOD PRESSURE SUPPORT)
PREFILLED_SYRINGE | INTRAVENOUS | Status: DC | PRN
  Administered 2023-12-03: 80 ug via INTRAVENOUS
  Administered 2023-12-03: 120 ug via INTRAVENOUS
  Administered 2023-12-03 (×2): 80 ug via INTRAVENOUS

## 2023-12-03 MED ORDER — OXYCODONE HCL 5 MG PO TABS: 5.0000 mg | ORAL_TABLET | Freq: Once | ORAL | Status: DC | PRN

## 2023-12-03 MED ORDER — FENTANYL CITRATE PF 50 MCG/ML IJ SOSY
25.0000 ug | PREFILLED_SYRINGE | INTRAMUSCULAR | Status: DC | PRN
  Administered 2023-12-03: 25 ug via INTRAVENOUS
  Administered 2023-12-03: 50 ug via INTRAVENOUS

## 2023-12-03 MED ORDER — PROPOFOL 10 MG/ML IV BOLUS
INTRAVENOUS | Status: AC
  Filled 2023-12-03: qty 20

## 2023-12-03 MED ORDER — ONDANSETRON HCL 4 MG/2ML IJ SOLN
INTRAMUSCULAR | Status: DC | PRN
Start: 2023-12-03 — End: 2023-12-03
  Administered 2023-12-03: 4 mg via INTRAVENOUS

## 2023-12-03 MED ORDER — LIDOCAINE HCL (PF) 2 % IJ SOLN
INTRAMUSCULAR | Status: DC | PRN
Start: 2023-12-03 — End: 2023-12-03
  Administered 2023-12-03: 60 mg via INTRADERMAL

## 2023-12-03 MED ORDER — CHLORHEXIDINE GLUCONATE 0.12 % MT SOLN
15.0000 mL | Freq: Once | OROMUCOSAL | Status: AC
  Administered 2023-12-03: 15 mL via OROMUCOSAL

## 2023-12-03 SURGICAL SUPPLY — 51 items
BAG COUNTER SPONGE SURGICOUNT (BAG) IMPLANT
BAG ZIPLOCK 12X15 (MISCELLANEOUS) IMPLANT
BIT DRILL RINGLOC 3.2MMX20 (BIT) IMPLANT
BLADE SAW SGTL 11.0X1.19X90.0M (BLADE) IMPLANT
CHLORAPREP W/TINT 26 (MISCELLANEOUS) ×2 IMPLANT
COVER PERINEAL POST (MISCELLANEOUS) ×2 IMPLANT
COVER SURGICAL LIGHT HANDLE (MISCELLANEOUS) ×2 IMPLANT
DERMABOND ADVANCED .7 DNX12 (GAUZE/BANDAGES/DRESSINGS) ×2 IMPLANT
DRAPE IMP U-DRAPE 54X76 (DRAPES) ×2 IMPLANT
DRAPE SHEET LG 3/4 BI-LAMINATE (DRAPES) ×6 IMPLANT
DRAPE STERI IOBAN 125X83 (DRAPES) ×2 IMPLANT
DRAPE U-SHAPE 47X51 STRL (DRAPES) ×4 IMPLANT
DRESSING PREVENA PLUS CUSTOM (GAUZE/BANDAGES/DRESSINGS) IMPLANT
DRSG AQUACEL AG ADV 3.5X10 (GAUZE/BANDAGES/DRESSINGS) ×2 IMPLANT
ELECT BLADE TIP CTD 4 INCH (ELECTRODE) ×2 IMPLANT
EVACUATOR DRAINAGE 7X20 100CC (MISCELLANEOUS) IMPLANT
GAUZE 4X4 16PLY ~~LOC~~+RFID DBL (SPONGE) ×2 IMPLANT
GLOVE BIO SURGEON STRL SZ8.5 (GLOVE) ×4 IMPLANT
GLOVE BIOGEL M 7.0 STRL (GLOVE) ×2 IMPLANT
GLOVE BIOGEL PI IND STRL 7.5 (GLOVE) ×4 IMPLANT
GLOVE BIOGEL PI IND STRL 8 (GLOVE) ×2 IMPLANT
GLOVE BIOGEL PI IND STRL 8.5 (GLOVE) ×2 IMPLANT
GLOVE SURG LX STRL 7.5 STRW (GLOVE) ×4 IMPLANT
GOWN SPEC L3 XXLG W/TWL (GOWN DISPOSABLE) ×4 IMPLANT
HEAD MOD COCR 28MM HD -3MM NK (Orthopedic Implant) IMPLANT
HOLDER FOLEY CATH W/STRAP (MISCELLANEOUS) ×2 IMPLANT
HOOD PEEL AWAY T7 (MISCELLANEOUS) ×8 IMPLANT
INSERT TIB BEARING E 28X42 (Insert) IMPLANT
KIT TURNOVER KIT A (KITS) ×2 IMPLANT
LINER DUAL MOBIL G7 42 E (Liner) IMPLANT
NDL SPNL 18GX3.5 QUINCKE PK (NEEDLE) ×2 IMPLANT
NEEDLE SPNL 18GX3.5 QUINCKE PK (NEEDLE) ×1 IMPLANT
PACK ANTERIOR HIP CUSTOM (KITS) ×2 IMPLANT
PENCIL SMOKE EVACUATOR (MISCELLANEOUS) ×2 IMPLANT
SAW OSC TIP CART 19.5X105X1.3 (SAW) ×2 IMPLANT
SCREW BONE 6.5X20 (Screw) IMPLANT
SEALER BIPOLAR AQUA 6.0 (INSTRUMENTS) ×2 IMPLANT
SOLUTION PRONTOSAN WOUND 350ML (IRRIGATION / IRRIGATOR) ×2 IMPLANT
SUT ETHIBOND NAB CT1 #1 30IN (SUTURE) ×4 IMPLANT
SUT ETHILON 2 0 PSLX (SUTURE) IMPLANT
SUT ETHILON 3 0 PS 1 (SUTURE) IMPLANT
SUT MNCRL AB 3-0 PS2 18 (SUTURE) ×2 IMPLANT
SUT MNCRL AB 4-0 PS2 18 (SUTURE) ×2 IMPLANT
SUT MON AB 2-0 CT1 36 (SUTURE) ×4 IMPLANT
SUT NYLON 3 0 (SUTURE) IMPLANT
SUT STRATAFIX 14 PDO 48 VLT (SUTURE) ×2 IMPLANT
SUT VIC AB 2-0 CT1 TAPERPNT 27 (SUTURE) ×4 IMPLANT
SYR 50ML LL SCALE MARK (SYRINGE) ×2 IMPLANT
TOWEL GREEN STERILE FF (TOWEL DISPOSABLE) ×2 IMPLANT
TRAY FOLEY MTR SLVR 16FR STAT (SET/KITS/TRAYS/PACK) ×2 IMPLANT
WATER STERILE IRR 1000ML POUR (IV SOLUTION) ×2 IMPLANT

## 2023-12-03 NOTE — Op Note (Signed)
 OPERATIVE REPORT   12/03/2023  5:48 PM  PATIENT:  Sherri [name]   SURGEON:  Redell JINNY Shoals, MD  ASSISTANT:  Valery Potters, PA-C.   PREOPERATIVE DIAGNOSIS:  Periprosthetic right hip dislocation.  POSTOPERATIVE DIAGNOSIS:  Same.  PROCEDURE:  Revision right total hip arthroplasty, head ball and liner exchange.  ANESTHESIA:   GETA.  ANTIBIOTICS: 2 g Ancef . 1 g vancomycin  (history of MRSA infection).  EXPLANTS: Polyliner 36 mm neutral. Biolox ceramic head ball 36-3 mm.  IMPLANTS: Biomet G7 dual mobility acetabular liner 42 mm, E, neutral. Dual mobility Vivacit-E outer bearing 28 by 42 mm. Metal head ball 28-3 mm.  SPECIMENS:  1. Superficial fluid culture swab right hip for aerobic and anaerobic culture. 2. Synovial fluid culture swab for aerobic and anaerobic culture.  TUBES AND DRAINS: 1.  10 mm flat JP drain in the subcutaneous tissue. 2.  Prevena negative pressure incisional dressing at 75 mmHg.  COMPLICATIONS: None.  DISPOSITION: Stable to PACU.  SURGICAL INDICATIONS:  Sherri [name] is a 83 y.o. female who underwent primary right total hip arthroplasty for chronic displaced femoral neck fracture on 11/12/2023.  She dislocated while in the hospital without any known event on 11/15/2023 and underwent successful closed reduction in the operating room by one of my partners on 11/16/2023.  She was subsequently discharged from the hospital and was readmitted due to issues with her right elbow.  While in the hospital, x-ray of the right hip was obtained, demonstrating periprosthetic dislocation of the right hip.  On 11/30/2023, I attempted to close reduce her hip in the operating room without success.  She was then indicated for open reduction, head ball and liner exchange of the right hip.  The risks, benefits, and alternatives were discussed with the patient. There are risks associated with the surgery including, but not limited to, problems with anesthesia (death),  infection, instability (giving out of the joint), dislocation, differences in leg length/angulation/rotation, fracture of bones, loosening or failure of implants, hematoma (blood accumulation) which may require surgical drainage, blood clots, pulmonary embolism, nerve injury (foot drop and lateral thigh numbness), and blood vessel injury. The patient understands these risks and elects to proceed.  PROCEDURE IN DETAIL: The patient was identified in the holding areas and 2 identifiers.  The surgical site was marked by myself.  She was taken to the operating room, and general anesthesia was obtained.  She was then transferred to the [facility] table.  All bony prominences were well-padded.  The right hip was prepped and draped in the normal sterile surgical fashion.  Timeout was called, verifying side and site of surgery.  She did receive IV antibiotics and 60 minutes beginning the procedure.  Using a #10 blade, I sharply excised her previous bikini skin incision.  I immediately encountered a large seroma which did not contain any purulent material.  I sent routine culture swabs  of the subcutaneous fluid collection for aerobic and anaerobic culture.  Her underlying fascial repair was intact.  The superficial layer was copiously irrigated with 3 L of normal saline.  I then removed her fascial sutures to obtain access to the hip joint.  The joint contained appropriate appearing serosanguineous fluid without any purulence or evidence of infection.  I sent routine culture swabs  of the joint fluid for aerobic and anaerobic culture.  I identified her prosthesis.  She had persistent posterior dislocation.  Using gentle pressure with a bone hook, traction, and rotation, I was able to convert her dislocation to an  anterior dislocation.  I was then able to reduce the hip.  The reduction was extremely stable, however considering that she has already dislocated twice, I elected to proceed with head ball and liner exchange.  Using  a bone tamp, I was able to remove her head ball without any difficulty.  The trunnion was retracted posteriorly, and acetabular retractors were placed.  Using osteotomes, I was able to remove her poly liner.  I then placed a trial dual mobility liner.  Femoral exposure was obtained, and I placed a trial dual mobility head ball construct.  The hip was reduced.  Soft tissue tension was excellent.  At 90 degrees of external rotation and full extension, the hip was stable to an anterior directed force with a bone hook.  The lower extremity was brought back into a neutral position, and her boot was removed from the [place] table.  I then performed posterior stability testing in body position and in a position of neutral abduction and 90 degrees of flexion.  I was able to internally rotate her to 90 degrees without any impingement or instability.  The hip was then gently dislocated with a bone hook, and all trial components were removed.  The real dual mobility acetabular liner was placed followed by the real dual mobility head ball construct.  The hip was reduced, and stability testing was repeated again with the same results.  The hip was then irrigated with Prontosan solution and 3 L of normal saline using pulsatile lavage.  1 g of vancomycin  powder was placed into the hip joint.  The fascia was repaired with #1 strata fix suture.  A 10 mm perforated JP drain was placed into the subcutaneous tissue and brought out through a stab incision over the lateral thigh.  The drain was sewn in with 3-0 nylon suture.  The deep dermal layer was reapproximated with 2-0 Monocryl suture.  The skin was reapproximated with 2-0 nylon vertical mattress sutures.  Customizable Prevena negative pressure dressing was applied and hooked up to suction at 75 mmHg.  There was excellent seal without any leak.  Dressing was placed over the drain site.  The patient was then transferred to her bed, extubated, and taken to the PACU in stable  condition.  Sponge, needle, instrument counts were correct in case x 2.  There were no known complications.  POSTOPERATIVE PLAN: Postoperatively, the patient be readmitted to the hospitalist service at [facility].  She may weight-bear as tolerated right lower extremity.  Posterior hip precautions.  We are holding chemical DVT prophylaxis due to anemia.  Mobilize out of bed with PT.  Follow IntraOp cultures.  We will discontinue the JP drain when output is less than 10 cc per shift.  Continue negative pressure dressing at 75 mmHg.  Upon discharge, the house VAC suction unit will need to be exchanged for a portable Prevena suction unit.  She will need to follow-up in the office within 7 days of discharge for removal of her negative pressure dressing.

## 2023-12-03 NOTE — Anesthesia Procedure Notes (Signed)
 Procedure Name: Intubation Date/Time: 12/03/2023 3:01 PM  Performed by: Judythe Tanda Aran, CRNAPre-anesthesia Checklist: Patient identified, Emergency Drugs available, Suction available and Patient being monitored Patient Re-evaluated:Patient Re-evaluated prior to induction Oxygen Delivery Method: Circle system utilized Preoxygenation: Pre-oxygenation with 100% oxygen Induction Type: IV induction Ventilation: Mask ventilation without difficulty Laryngoscope Size: 2 and Miller Grade View: Grade I Tube type: Oral Tube size: 7.0 mm Number of attempts: 1 Airway Equipment and Method: Stylet Placement Confirmation: ETT inserted through vocal cords under direct vision, positive ETCO2 and breath sounds checked- equal and bilateral Secured at: 20 cm Dental Injury: Teeth and Oropharynx as per pre-operative assessment

## 2023-12-03 NOTE — Interval H&P Note (Signed)
 History and Physical Interval Note:  12/03/2023 1:12 PM  Sherri  [name]  has presented today for surgery, with the diagnosis of Failed right total hip with dislocation.  The various methods of treatment have been discussed with the patient and family. After consideration of risks, benefits and other options for treatment, the patient has consented to  Procedure(s) with comments: TOTAL HIP REVISION (Right) - Right acetabular revision Anterior approach as a surgical intervention.  The patient's history has been reviewed, patient examined, no change in status, stable for surgery.  I have reviewed the patient's chart and labs.  Questions were answered to the patient's satisfaction.     Redell PARAS Sherri [name]

## 2023-12-03 NOTE — Progress Notes (Addendum)
 Spoke with Dr Fidel this morning about Sherri [name].  Plan to transfer to [facility] for Surgery and back to [facility] after surgery.  [facility] called set up to bring patient over to us  at 1200  [facility] BSN, Radio producer - Perioperative Services [facility] [phone]

## 2023-12-03 NOTE — Progress Notes (Signed)
 PROGRESS NOTE    Sherri  Sherri [name]  FMW:[id number] DOB: November 04, 1940 DOA: 11/26/2023 PCP: [name] PARAS, MD  No chief complaint on file.   Brief Narrative:   Sherri [name]  Sherri [name] is an 83 y.o. 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   Assessment & Plan:   Principal Problem:   Wound dehiscence Active Problems:   Hyponatremia   Hyperlipidemia   Multiple sclerosis (HCC)   Essential hypertension   Rheumatoid arthritis (HCC)   Macrocytic anemia   Cellulitis of right elbow  Right elbow visible hardware with Wound dehiscence with cellulitis: Orthopedic surgery to recommend further antibiotics. Status post I&D of the right elbow on 11/27/2023 and hardware removal. Blood cx NGTD x5  on oral Doxy and cefuroxime , plan for 4 to 6 weeks. Orthopedic surgery recommended aspirin  for 4 weeks. Follow-up with orthopedic surgery in 2 weeks. PT eval is pending.   Superior Dislocation of R hip Arthroplasty S/p closed reduction on 7/7 Planning for revision of R THA 7/10 at [facility] (today)  Hypovolemic hyponatremia: trend   Hypokalemia: improved   Macrocytic anemia/acute blood loss anemia Normal B12, folate Iron studies suggestive of AOCD   Rheumatoid arthritis: Continue methotrexate  and prednisone .   History of multiple sclerosis: She is no longer on these medication.   Chronic pain: Continue Cymbalta .   Urinary retention: Continue oxybutynin .   History of recent right hip surgery in May 2025: She will need skilled nursing facility.   Diffuse Ecchymoses Bruises all over Platelets ok, will monitor Continue to monitor, may need follow up  with derm and/or hematology  Goals of care: Seen by palliative 7/5, they've signed off    DVT prophylaxis: SCD Code Status: full Family Communication: son at bedside Disposition:   Status is: Inpatient Remains inpatient appropriate because: need for ongoing care   Consultants:  orthopedics  Procedures:  7/7 Closed reduction right hip.   7/5  Irrigation debridement of right elbow with hardware removal olecranon excision and triceps advancement   Antimicrobials:  Anti-infectives (From admission, onward)    Start     Dose/Rate Route Frequency Ordered Stop   12/03/23 1300  ceFAZolin  (ANCEF ) IVPB 2g/100 mL premix        2 g 200 mL/hr over 30 Minutes Intravenous On call to O.R. 12/03/23 1241 12/04/23 0559   12/03/23 1300  vancomycin  (VANCOCIN ) IVPB 1000 mg/200 mL premix        1,000 mg 200 mL/hr over 60 Minutes Intravenous On call to O.R. 12/03/23 1241 12/03/23 1403   12/03/23 1257  ceFAZolin  (ANCEF ) 2-4 GM/100ML-% IVPB       Note to Pharmacy: Sherri [name] B: cabinet override      12/03/23 1257 12/04/23 0114   11/30/23 1700  [MAR Hold]  cefUROXime  (CEFTIN ) tablet 500 mg        (MAR Hold since Thu 12/03/2023 at 1229.Hold Reason: Transfer to [facility] Procedural area)   500 mg Oral 2 times daily with meals 11/30/23 1021 12/14/23 1659   11/30/23 1100  doxycycline  (VIBRA -TABS) tablet 100 mg  Status:  Discontinued        100 mg Oral Every 12 hours 11/30/23 1005 11/30/23 1021   11/30/23 1100  [MAR Hold]  doxycycline  (VIBRA -TABS) tablet  100 mg        (MAR Hold since Thu 12/03/2023 at 1229.Hold Reason: Transfer to [facility] Procedural area)   100 mg Oral Every 12 hours 11/30/23 1021 12/14/23 0959   11/28/23 2200  ceFAZolin  (ANCEF ) IVPB 2g/100 mL premix        2 g 200 mL/hr over 30 Minutes Intravenous Every 8 hours 11/28/23 1714 11/29/23 0558   11/28/23 1453  vancomycin  (VANCOCIN ) powder  Status:  Discontinued          As needed 11/28/23 1453 11/28/23 1553   11/28/23 1315  ceFAZolin  (ANCEF ) IVPB  2g/100 mL premix        2 g 200 mL/hr over 30 Minutes Intravenous  Once 11/28/23 1303 11/28/23 1421   11/28/23 1315  vancomycin  (VANCOCIN ) IVPB 1000 mg/200 mL premix  Status:  Discontinued        1,000 mg 200 mL/hr over 60 Minutes Intravenous  Once 11/28/23 1303 11/28/23 1645   11/28/23 0000  cefTRIAXone  (ROCEPHIN ) 1 g in sodium chloride  0.9 % 100 mL IVPB  Status:  Discontinued        1 g 200 mL/hr over 30 Minutes Intravenous Every 24 hours 11/27/23 0702 11/27/23 0856   11/27/23 1100  ceFAZolin  (ANCEF ) IVPB 2g/100 mL premix  Status:  Discontinued        2 g 200 mL/hr over 30 Minutes Intravenous On call to O.R. 11/27/23 0216 11/28/23 0559   11/27/23 1100  vancomycin  (VANCOCIN ) IVPB 1000 mg/200 mL premix  Status:  Discontinued        1,000 mg 200 mL/hr over 60 Minutes Intravenous On call to O.R. 11/27/23 0216 11/28/23 0559   11/27/23 0900  cefTRIAXone  (ROCEPHIN ) 1 g in sodium chloride  0.9 % 100 mL IVPB  Status:  Discontinued        1 g 200 mL/hr over 30 Minutes Intravenous Every 24 hours 11/27/23 0856 11/30/23 1050   11/27/23 0600  cephALEXin  (KEFLEX ) capsule 500 mg  Status:  Discontinued        500 mg Oral 3 times daily 11/27/23 0519 11/27/23 0702   11/27/23 0430  ceFAZolin  (ANCEF ) IVPB 2g/100 mL premix  Status:  Discontinued        2 g 200 mL/hr over 30 Minutes Intravenous  Once 11/27/23 0422 11/27/23 0517       Subjective: No complaints  Objective: Vitals:   12/03/23 1259 12/03/23 1410 12/03/23 1420 12/03/23 1445  BP: (!) 149/55 (!) 144/73 138/77 136/69  Pulse: 89 93 94 92  Resp: 15 16 16 16   Temp: 99 F (37.2 C)     TempSrc: Oral     SpO2: 94% 95% 94% 94%  Weight:      Height:        Intake/Output Summary (Last 24 hours) at 12/03/2023 1508 Last data filed at 12/03/2023 1200 Gross per 24 hour  Intake 100 ml  Output 2000 ml  Net -1900 ml   Filed Weights   11/26/23 2012 11/28/23 1238 12/03/23 1244  Weight: 48.6 kg 48.6 kg 48.6 kg    Examination:  General: No  acute distress. Cardiovascular: RRR Lungs: unlabored Abdomen: Soft, nontender, nondistended  Neurological: Alert and oriented 3. Moves all extremities 4. Cranial nerves II through XII grossly intact. Skin: diffuse scattered ecchymoses Extremities: No clubbing or cyanosis. No edema.  Data Reviewed: I have personally reviewed following labs and imaging studies  CBC: Recent Labs  Lab 11/27/23 0015 11/27/23 0539 11/29/23 0648 11/29/23 2048 11/30/23 1005 12/03/23 0726  WBC  11.8* 9.5 13.1*  --  9.1 6.1  NEUTROABS  --  7.7  --   --  7.8* 4.1  HGB 8.1* 8.1* 6.6* 10.0* 10.8* 8.8*  HCT 25.5* 24.7* 20.8* 31.2* 33.2* 27.2*  MCV 107.1* 102.1* 107.8*  --  99.4 99.6  PLT 351 354 322  --  309 283    Basic Metabolic Panel: Recent Labs  Lab 11/27/23 0539 11/28/23 0419 11/29/23 0502 11/30/23 0508 12/03/23 0726  NA 131* 133* 133* 135 136  K 3.3* 3.4* 4.7 3.7 3.8  CL 95* 99 102 101 101  CO2 29 27 24 25 27   GLUCOSE 80 85 132* 69* 90  BUN 9 7* 5* <5* 6*  CREATININE 0.60 0.65 0.53 0.62 0.64  CALCIUM  7.6* 7.6* 7.6* 8.0* 8.0*  MG  --   --   --   --  1.8  PHOS  --   --   --   --  2.9    GFR: Estimated Creatinine Clearance: 41.6 mL/min (by C-G formula based on SCr of 0.64 mg/dL).  Liver Function Tests: Recent Labs  Lab 12/03/23 0726  AST 19  ALT 9  ALKPHOS 57  BILITOT 0.8  PROT 4.9*  ALBUMIN  2.1*    CBG: No results for input(s): GLUCAP in the last 168 hours.   Recent Results (from the past 240 hours)  Culture, blood (Routine X 2) w Reflex to ID Panel     Status: None   Collection Time: 11/27/23  8:26 AM   Specimen: BLOOD RIGHT HAND  Result Value Ref Range Status   Specimen Description BLOOD RIGHT HAND  Final   Special Requests   Final    BOTTLES DRAWN AEROBIC ONLY Blood Culture results may not be optimal due to an inadequate volume of blood received in culture bottles   Culture   Final    NO GROWTH 5 DAYS Performed at [facility], [address]  [place], [place] [address]    Report Status 12/02/2023 FINAL  Final  Culture, blood (Routine X 2) w Reflex to ID Panel     Status: None   Collection Time: 11/27/23  8:27 AM   Specimen: BLOOD LEFT ARM  Result Value Ref Range Status   Specimen Description BLOOD LEFT ARM  Final   Special Requests   Final    BOTTLES DRAWN AEROBIC AND ANAEROBIC Blood Culture adequate volume   Culture   Final    NO GROWTH 5 DAYS Performed at [facility], [address], [place], [place] [address]    Report Status 12/02/2023 FINAL  Final  Surgical PCR screen     Status: None   Collection Time: 12/03/23 10:24 AM   Specimen: Nasal Mucosa; Nasal Swab  Result Value Ref Range Status   MRSA, PCR NEGATIVE NEGATIVE Final   Staphylococcus aureus NEGATIVE NEGATIVE Final    Comment: (NOTE) The Xpert SA Assay (FDA approved for NASAL specimens in patients 88 years of age and older), is one component of Daylah Sayavong comprehensive surveillance program. It is not intended to diagnose infection nor to guide or monitor treatment. Performed at [facility], [address], [place], [place] [address]          Radiology Studies: No results found.       Scheduled Meds:  [MAR Hold] aspirin  EC  81 mg Oral BID   [MAR Hold] atorvastatin   10 mg Oral Daily   [MAR Hold] carvedilol   3.125 mg Oral BID WC   [  MAR Hold] cefUROXime   500 mg Oral BID WC   chlorhexidine   60 mL Topical Once   [MAR Hold] Chlorhexidine  Gluconate Cloth  6 each Topical Daily   [MAR Hold] cyclobenzaprine   10 mg Oral QHS   [MAR Hold] docusate sodium   100 mg Oral Daily   [MAR Hold] doxycycline   100 mg Oral Q12H   [MAR Hold] DULoxetine   20 mg Oral QHS   [MAR Hold] feeding supplement  237 mL Oral BID BM   [MAR Hold] folic acid   1 mg Oral Daily   [MAR Hold] mupirocin  ointment  1 Application Nasal BID   [MAR Hold] pantoprazole   40 mg Oral BID AC   povidone-iodine   2 Application Topical Once   [MAR Hold] predniSONE   5 mg Oral Q breakfast   [MAR Hold]  senna  1 tablet Oral BID   Continuous Infusions:  ceFAZolin       ceFAZolin  (ANCEF ) IV     lactated ringers  10 mL/hr at 12/03/23 1254   tranexamic acid      tranexamic acid        LOS: 6 days    Time spent: over 30 min    Meliton Monte, MD [facility]   To contact the attending provider between 7A-7P or the covering provider during after hours 7P-7A, please log into the web site [email or web address] and access using universal [place] password for that web site. If you do not have the password, please call the hospital operator.  12/03/2023, 3:08 PM

## 2023-12-03 NOTE — Anesthesia Postprocedure Evaluation (Signed)
 Anesthesia Post Note  Patient: Jacquelinne  A Blust  Procedure(s) Performed: TOTAL HIP REVISION (Right: Hip)     Patient location during evaluation: PACU Anesthesia Type: General Level of consciousness: awake and alert Pain management: pain level controlled Vital Signs Assessment: post-procedure vital signs reviewed and stable Respiratory status: spontaneous breathing, nonlabored ventilation, respiratory function stable and patient connected to nasal cannula oxygen Cardiovascular status: blood pressure returned to baseline and stable Postop Assessment: no apparent nausea or vomiting Anesthetic complications: no   No notable events documented.  Last Vitals:  Vitals:   12/03/23 2017 12/03/23 2030  BP: (!) 110/96 128/73  Pulse: (!) 110 99  Resp: 15 13  Temp:    SpO2: 100% 100%    Last Pain:  Vitals:   12/03/23 2017  TempSrc:   PainSc: 10-Worst pain ever    LLE Motor Response: (P) Purposeful movement (12/03/23 2030) LLE Sensation: (P) Full sensation (12/03/23 2030) RLE Motor Response: (P) Purposeful movement (12/03/23 2030) RLE Sensation: (P) Decreased (12/03/23 2030) L Sensory Level: (P) S1-Sole of foot, small toes (12/03/23 2030) R Sensory Level: (P) S1-Sole of foot, small toes (12/03/23 2030)  Thom JONELLE Peoples

## 2023-12-03 NOTE — Anesthesia Preprocedure Evaluation (Addendum)
 Anesthesia Evaluation  Patient identified by MRN, date of birth, ID band Patient awake    Reviewed: Allergy & Precautions, NPO status , Patient's Chart, lab work & pertinent test results  History of Anesthesia Complications (+) PONV  Airway Mallampati: I  TM Distance: >3 FB Neck ROM: Full    Dental  (+) Caps, Dental Advisory Given   Pulmonary former smoker   breath sounds clear to auscultation       Cardiovascular hypertension, Pt. on home beta blockers and Pt. on medications (-) angina + Peripheral Vascular Disease   Rhythm:Regular Rate:Normal  HLD '18 ECHO:  - Left ventricle: The cavity size was normal. Systolic function was    normal. The estimated ejection fraction was in the range of 60%    to 65%. Wall motion was normal; there were no regional wall    motion abnormalities. Left ventricular diastolic function    parameters were normal.  - Aortic valve: Valve area (VTI): 2 cm^2. Valve area (Vmax): 2.17    cm^2. Valve area (Vmean): 1.86 cm^2.  - Mitral valve: There was mild regurgitation.  - Left atrium: The atrium was mildly dilated.  - Atrial septum: There was increased thickness of the septum,    consistent with lipomatous hypertrophy. No defect or patent    foramen ovale was identified.     Neuro/Psych  Headaches  Anxiety     Multiple sclerosis  H/o cervical fusion  Neuromuscular disease (right-sided sciatica, cervical radiculopathy and lumbar radiculopathy s/p fusions)    GI/Hepatic Neg liver ROS, PUD,GERD  Medicated and Controlled,,  Endo/Other  negative endocrine ROS    Renal/GU negative Renal ROS     Musculoskeletal  (+) Arthritis , Rheumatoid disorders,  Osteoporosis    Abdominal   Peds  Hematology  (+) Blood dyscrasia, anemia   Anesthesia Other Findings   Reproductive/Obstetrics                              Anesthesia Physical Anesthesia Plan  ASA: 3  Anesthesia  Plan: General   Post-op Pain Management: Tylenol  PO (pre-op)*   Induction: Intravenous  PONV Risk Score and Plan: 4 or greater and Ondansetron , Dexamethasone  and Treatment may vary due to age or medical condition  Airway Management Planned: Oral ETT  Additional Equipment: None  Intra-op Plan:   Post-operative Plan: Extubation in OR  Informed Consent: I have reviewed the patients History and Physical, chart, labs and discussed the procedure including the risks, benefits and alternatives for the proposed anesthesia with the patient or authorized representative who has indicated his/her understanding and acceptance.     Dental advisory given  Plan Discussed with: CRNA and Surgeon  Anesthesia Plan Comments:          Anesthesia Quick Evaluation

## 2023-12-03 NOTE — Discharge Instructions (Addendum)
 [name] Orthopedic Discharge Instructions For Right Arm  Diet: As you were doing prior to hospitalization   Shower:  May shower but keep the wounds dry, use an occlusive plastic wrap, NO SOAKING IN TUB.  If the bandage gets wet, change with a clean dry gauze.  If you have a splint on, leave the splint in place and keep the splint dry with a plastic bag.  Dressing:  You may change your dressing 3-5 days after surgery, unless you have a splint.  If the dressing remains clean and dry it can also be left on until follow up.  If you change the dressing replace with clean gauze and tape or ace wrap.  If you have a splint, then just leave the splint in place and we will change your bandages during your first follow-up appointment.  If water  gets in the splint or the splint gets saturated please call the clinic and we can see you to change your splint.  For most surgeries, the stitches used are dissolvable and don't need to be removed.  However, depending on your surgery, you may have stitches that will need to be removed in the office in about 2 weeks.    Activity:  Increase activity slowly as tolerated, but follow the weight bearing instructions below.  Do not drive for the next 6 weeks.  In addition, you cannot be taking narcotics while you drive, and you must feel in control of the vehicle.    Weight Bearing:   You may not bear any weight on your right arm until further instructed to do so.    Blood clot prevention (DVT Prophylaxis): After surgery you are at an increased risk for a blood clot. you were prescribed a blood thinner, Aspirin  81mg  , to be taken twice daily for a total of 4 weeks from surgery to help reduce your risk of getting a blood clot. Signs of a pulmonary embolus (blood clot in the lungs) include sudden short of breath, feeling lightheaded or dizzy, chest pain with a deep breath, rapid pulse rapid breathing.  Signs of a blood clot in your arms or legs include new  unexplained swelling and cramping, warm, red or darkened skin around the painful area.  Please call the office or 911 right away if these signs or symptoms develop.  To prevent constipation: you may use a stool softener such as - Colace (over the counter) 100 mg by mouth twice a day  Drink plenty of fluids (prune juice may be helpful) and high fiber foods Miralax  (over the counter) for constipation as needed.    Itching:  If you experience itching with your medications, try taking only a single pain pill, or even half a pain pill at a time.  You may take up to 10 pain pills per day, and you can also use benadryl  over the counter for itching or also to help with sleep.   Precautions:  If you experience chest pain or shortness of breath - call 911 immediately for transfer to the hospital emergency department!!  Call office [phone]) for the following: Temperature greater than 101F Persistent nausea and vomiting Severe uncontrolled pain Redness, tenderness, or signs of infection (pain, swelling, redness, odor or green/yellow discharge around the site) Difficulty breathing, headache or visual disturbances Hives Persistent dizziness or light-headedness Extreme fatigue Any other questions or concerns you may have after discharge  In an emergency, call 911 or go to an Emergency Department at a nearby hospital  Follow-  Up Appointment:  Please call for an appointment to be seen approximately 2-3 week after surgery in [facility] with your surgeon Dr. Toribio Higashi - [phone] Address: [address], [place], [place] [address]        Dr. Redell [facility] Joint Replacement Specialist [facility] [address] [place], [place] [address] [phone]   TOTAL HIP REPLACEMENT POSTOPERATIVE DIRECTIONS    Hip Rehabilitation, Guidelines Following Surgery   WEIGHT BEARING Weight bearing as tolerated with assist device (walker, cane, etc) as  directed, use it as long as suggested by your surgeon or therapist, typically at least 4-6 weeks. Maintain posterior hip precautions.   The results of a hip operation are greatly improved after range of motion and muscle strengthening exercises. Follow all safety measures which are given to protect your hip. If any of these exercises cause increased pain or swelling in your joint, decrease the amount until you are comfortable again. Then slowly increase the exercises. Call your caregiver if you have problems or questions.   HOME CARE INSTRUCTIONS  Most of the following instructions are designed to prevent the dislocation of your new hip.  Remove items at home which could result in a fall. This includes throw rugs or furniture in walking pathways.  Continue medications as instructed at time of discharge. You may have some home medications which will be placed on hold until you complete the course of blood thinner medication. Keep dressing clean and dry. Do not remove your dressing. Do not put on socks or shoes without following the instructions of your caregivers.   Sit on chairs with arms. Use the chair arms to help push yourself up when arising.  Arrange for the use of a toilet seat elevator so you are not sitting low.  Walk with walker as instructed.  You may resume a sexual relationship in one month or when given the OK by your caregiver.  Use walker as long as suggested by your caregivers.  You may put full weight on your legs and walk as much as is comfortable. Avoid periods of inactivity such as sitting longer than an hour when not asleep. This helps prevent blood clots.  You may return to work once you are cleared by Designer, industrial/product.  Do not drive a car for 6 weeks or until released by your surgeon.  Do not drive while taking narcotics.  Wear elastic stockings for two weeks following surgery during the day but you may remove then at night.  Make sure you keep all of your appointments after  your operation with all of your doctors and caregivers. You should call the office at the above phone number and make an appointment for approximately two weeks after the date of your surgery. Please pick up a stool softener and laxative for home use as long as you are requiring pain medications. ICE to the affected hip every three hours for 30 minutes at a time and then as needed for pain and swelling. Continue to use ice on the hip for pain and swelling from surgery. You may notice swelling that will progress down to the foot and ankle.  This is normal after surgery.  Elevate the leg when you are not up walking on it.   It is important for you to complete the blood thinner medication as prescribed by your doctor. Continue to use the breathing machine which will help keep your temperature down.  It is common for your temperature to cycle up  and down following surgery, especially at night when you are not up moving around and exerting yourself.  The breathing machine keeps your lungs expanded and your temperature down.  RANGE OF MOTION AND STRENGTHENING EXERCISES  These exercises are designed to help you keep full movement of your hip joint. Follow your caregiver's or physical therapist's instructions. Perform all exercises about fifteen times, three times per day or as directed. Exercise both hips, even if you have had only one joint replacement. These exercises can be done on a training (exercise) mat, on the floor, on a table or on a bed. Use whatever works the best and is most comfortable for you. Use music or television while you are exercising so that the exercises are a pleasant break in your day. This will make your life better with the exercises acting as a break in routine you can look forward to.  Lying on your back, slowly slide your foot toward your buttocks, raising your knee up off the floor. Then slowly slide your foot back down until your leg is straight again.  Lying on your back spread  your legs as far apart as you can without causing discomfort.  Lying on your side, raise your upper leg and foot straight up from the floor as far as is comfortable. Slowly lower the leg and repeat.  Lying on your back, tighten up the muscle in the front of your thigh (quadriceps muscles). You can do this by keeping your leg straight and trying to raise your heel off the floor. This helps strengthen the largest muscle supporting your knee.  Lying on your back, tighten up the muscles of your buttocks both with the legs straight and with the knee bent at a comfortable angle while keeping your heel on the floor.   SKILLED REHAB INSTRUCTIONS: If the patient is transferred to a skilled rehab facility following release from the hospital, a list of the current medications will be sent to the facility for the patient to continue.  When discharged from the skilled rehab facility, please have the facility set up the patient's Home Health Physical Therapy prior to being released. Also, the skilled facility will be responsible for providing the patient with their medications at time of release from the facility to include their pain medication and their blood thinner medication. If the patient is still at the rehab facility at time of the two week follow up appointment, the skilled rehab facility will also need to assist the patient in arranging follow up appointment in our office and any transportation needs.  POST-OPERATIVE OPIOID TAPER INSTRUCTIONS: It is important to wean off of your opioid medication as soon as possible. If you do not need pain medication after your surgery it is ok to stop day one. Opioids include: Codeine, Hydrocodone (Norco, Vicodin), Oxycodone (Percocet, oxycontin ) and hydromorphone  amongst others.  Long term and even short term use of opiods can cause: Increased pain response Dependence Constipation Depression Respiratory depression And more.  Withdrawal symptoms can include Flu like  symptoms Nausea, vomiting And more Techniques to manage these symptoms Hydrate well Eat regular healthy meals Stay active Use relaxation techniques(deep breathing, meditating, yoga) Do Not substitute Alcohol  to help with tapering If you have been on opioids for less than two weeks and do not have pain than it is ok to stop all together.  Plan to wean off of opioids This plan should start within one week post op of your joint replacement. Maintain the same interval or time between  taking each dose and first decrease the dose.  Cut the total daily intake of opioids by one tablet each day Next start to increase the time between doses. The last dose that should be eliminated is the evening dose.    MAKE SURE YOU:  Understand these instructions.  Will watch your condition.  Will get help right away if you are not doing well or get worse.  Pick up stool softner and laxative for home use following surgery while on pain medications. Do not remove your dressing. Keep dressing clean and dry. Continue to use ice for pain and swelling after surgery. Do not use any lotions or creams on the incision until instructed by your surgeon. Maintain posterior hip precautions.  Please empty JP drain and recharge bulb every 4 hours and keep log of output.  Please bring log of JP drain output to your appointment.

## 2023-12-03 NOTE — Progress Notes (Signed)
 2 Day Post-Op Procedure(s) (LRB): IRRIGATION AND DEBRIDEMENT RIGHT ELBOW  Subjective: Patient reports pain as mild about the right elbow and severe of right hip.  Right hip is being managed by Dr. Fidel and his team, currently dislocated.    Right extremity in splint, though patient still attempting to flex her right arm somewhat frequently.  Received her pain medicine recently.  Orthopedic tech assistance at bedside.  Denies numbness or tingling of right arm.  Plan on changing out splint today and checking wound.     Objective:   VITALS:   Vitals:   12/02/23 1557 12/02/23 2046 12/03/23 0407 12/03/23 0741  BP: 98/60 130/69 (!) 142/68 131/73  Pulse: 96 98 92 99  Resp: 16 16 17 14   Temp: 98.6 F (37 C) 98.5 F (36.9 C) 98.3 F (36.8 C) 97.9 F (36.6 C)  TempSrc:  Oral Oral Oral  SpO2: 100% 98% 95% 98%  Weight:      Height:        Resting comfortably in bed in NAD Sensation intact distally Intact pulses distally Compartment soft Splint clean, dry, and intact Wiggles fingers appropriately No significant swelling adjacent to splint (proximally or distally), and splint understandably limits visual examination of RUE.  However, splint removed and wound inspected, healing well without erythema or drainage.  Mild-moderate bruising of RUE appreciated.  Sutures intact.  Skin very thin, warm, and dry.        Lab Results  Component Value Date   WBC 6.1 12/03/2023   HGB 8.8 (L) 12/03/2023   HCT 27.2 (L) 12/03/2023   MCV 99.6 12/03/2023   PLT 283 12/03/2023   BMET    Component Value Date/Time   NA 136 12/03/2023 0726   NA 139 06/20/2019 1335   NA 136 02/22/2015 0000   K 3.8 12/03/2023 0726   K 4.4 02/22/2015 0000   CL 101 12/03/2023 0726   CL 98 02/22/2015 0000   CO2 27 12/03/2023 0726   CO2 30 02/22/2015 0000   GLUCOSE 90 12/03/2023 0726   BUN 6 (L) 12/03/2023 0726   BUN 10 06/20/2019 1335   CREATININE 0.64 12/03/2023 0726   CREATININE 1.03 (H) 08/06/2023  1510   CALCIUM  8.0 (L) 12/03/2023 0726   CALCIUM  9.6 02/22/2015 0000   EGFR 55 (L) 08/06/2023 1510   GFRNONAA >60 12/03/2023 0726   GFRNONAA 56 (L) 09/26/2020 1536    Xray: Postop x-rays show interval removal of hardware with resection of olecranon fragment   Assessment/Plan: 3 Days Post-Op   Principal Problem:   Wound dehiscence Active Problems:   Hyperlipidemia   Multiple sclerosis (HCC)   Essential hypertension   Rheumatoid arthritis (HCC)   Hyponatremia   Macrocytic anemia   Cellulitis of right elbow   Status post right elbow hardware removal with olecranon excision and triceps advancement 11/28/2023  12/01/23: Patient resting.  Looking to resume PT today.  Unsuccessful reduction by Dr. Fidel in OR yesterday see notes.  May resplint RUE for improved flexion prevention.  Discussed with Dr. Edna.  Likely SNF 12/02/23: Wound appears to be healing okay.  Splint reapplied with medial and lateral A-frame to reduce flexion capabilities.  No sling recommended but can use pillows to prop arm up for support/reduced strain.  RUE still grossly neurovascularly intact post splint application.  Can keep this on until follow up with Dr. Edna.  Still no weight bearing of RUE, plan to reassess at follow up 10-14 days post-op.   Post op  recs: WB: Immobilized in a long-arm splint, okay to use a walker for weightbearing with the right upper extremity Abx: ancef  periop and ceftriaxone  post op then dc on keflex  Imaging: PACU xrays DVT prophylaxis: Aspirin  81mg  daily for 4 weeks Follow up: 10-14 days after surgery for a wound check with Dr. Edna at [facility].  Address: [address], [place], [place] [address]  Office Phone: [phone]   Sherri [name] 12/03/2023, 9:26 AM     Contact information:   Weekdays 7am-5pm epic message Dr. Edna, or call office for patient follow up: [phone] After hours and holidays please check [email or web address]  for group call information for [facility]

## 2023-12-03 NOTE — Transfer of Care (Signed)
 Immediate Anesthesia Transfer of Care Note  Patient: Sherri  [name]  Procedure(s) Performed: TOTAL HIP REVISION (Right: Hip)  Patient Location: PACU  Anesthesia Type:General  Level of Consciousness: drowsy  Airway & Oxygen Therapy: Patient Spontanous Breathing and Patient connected to face mask  Post-op Assessment: Report given to RN and Post -op Vital signs reviewed and stable  Post vital signs: Reviewed and stable  Last Vitals:  Vitals Value Taken Time  BP    Temp    Pulse    Resp    SpO2      Last Pain:  Vitals:   12/03/23 1445  TempSrc:   PainSc: 6       Patients Stated Pain Goal: 5 (12/01/23 1231)  Complications: No notable events documented.

## 2023-12-04 ENCOUNTER — Encounter (HOSPITAL_COMMUNITY): Payer: Self-pay | Admitting: Orthopedic Surgery

## 2023-12-04 DIAGNOSIS — T8130XA Disruption of wound, unspecified, initial encounter: Secondary | ICD-10-CM | POA: Diagnosis not present

## 2023-12-04 LAB — PHOSPHORUS: Phosphorus: 3.7 mg/dL (ref 2.5–4.6)

## 2023-12-04 LAB — CBC WITH DIFFERENTIAL/PLATELET
Abs Immature Granulocytes: 0.47 K/uL — ABNORMAL HIGH (ref 0.00–0.07)
Basophils Absolute: 0 K/uL (ref 0.0–0.1)
Basophils Relative: 0 %
Eosinophils Absolute: 0 K/uL (ref 0.0–0.5)
Eosinophils Relative: 0 %
HCT: 27.9 % — ABNORMAL LOW (ref 36.0–46.0)
Hemoglobin: 8.7 g/dL — ABNORMAL LOW (ref 12.0–15.0)
Immature Granulocytes: 5 %
Lymphocytes Relative: 5 %
Lymphs Abs: 0.6 K/uL — ABNORMAL LOW (ref 0.7–4.0)
MCH: 32.7 pg (ref 26.0–34.0)
MCHC: 31.2 g/dL (ref 30.0–36.0)
MCV: 104.9 fL — ABNORMAL HIGH (ref 80.0–100.0)
Monocytes Absolute: 1 K/uL (ref 0.1–1.0)
Monocytes Relative: 9 %
Neutro Abs: 8.4 K/uL — ABNORMAL HIGH (ref 1.7–7.7)
Neutrophils Relative %: 81 %
Platelets: 243 K/uL (ref 150–400)
RBC: 2.66 MIL/uL — ABNORMAL LOW (ref 3.87–5.11)
RDW: 17.7 % — ABNORMAL HIGH (ref 11.5–15.5)
WBC: 10.5 K/uL (ref 4.0–10.5)
nRBC: 0.2 % (ref 0.0–0.2)

## 2023-12-04 LAB — COMPREHENSIVE METABOLIC PANEL WITH GFR
ALT: 6 U/L (ref 0–44)
AST: 26 U/L (ref 15–41)
Albumin: 2.2 g/dL — ABNORMAL LOW (ref 3.5–5.0)
Alkaline Phosphatase: 52 U/L (ref 38–126)
Anion gap: 12 (ref 5–15)
BUN: 8 mg/dL (ref 8–23)
CO2: 20 mmol/L — ABNORMAL LOW (ref 22–32)
Calcium: 7.7 mg/dL — ABNORMAL LOW (ref 8.9–10.3)
Chloride: 100 mmol/L (ref 98–111)
Creatinine, Ser: 0.67 mg/dL (ref 0.44–1.00)
GFR, Estimated: >60 mL/min (ref >60)
Glucose, Bld: 99 mg/dL (ref 70–99)
Potassium: 4.7 mmol/L (ref 3.5–5.1)
Sodium: 132 mmol/L — ABNORMAL LOW (ref 135–145)
Total Bilirubin: 1.3 mg/dL — ABNORMAL HIGH (ref 0.0–1.2)
Total Protein: 4.8 g/dL — ABNORMAL LOW (ref 6.5–8.1)

## 2023-12-04 LAB — MAGNESIUM: Magnesium: 1.7 mg/dL (ref 1.7–2.4)

## 2023-12-04 MED ORDER — VANCOMYCIN HCL IN DEXTROSE 1-5 GM/200ML-% IV SOLN
1000.0000 mg | Freq: Two times a day (BID) | INTRAVENOUS | Status: AC
  Administered 2023-12-04: 1000 mg via INTRAVENOUS
  Filled 2023-12-04: qty 200

## 2023-12-04 MED ORDER — ONDANSETRON HCL 4 MG PO TABS: 4.0000 mg | ORAL_TABLET | Freq: Four times a day (QID) | ORAL | Status: DC | PRN

## 2023-12-04 MED ORDER — METOCLOPRAMIDE HCL 5 MG/ML IJ SOLN: 5.0000 mg | Freq: Three times a day (TID) | INTRAMUSCULAR | Status: DC | PRN

## 2023-12-04 MED ORDER — SODIUM CHLORIDE 0.9 % IV SOLN: INTRAVENOUS | Status: DC

## 2023-12-04 MED ORDER — ONDANSETRON HCL 4 MG/2ML IJ SOLN
4.0000 mg | Freq: Four times a day (QID) | INTRAMUSCULAR | Status: DC | PRN
  Administered 2023-12-07: 4 mg via INTRAVENOUS
  Filled 2023-12-04: qty 2

## 2023-12-04 MED ORDER — MENTHOL 3 MG MT LOZG
1.0000 | LOZENGE | OROMUCOSAL | Status: DC | PRN
  Administered 2023-12-05: 3 mg via ORAL
  Filled 2023-12-04: qty 9

## 2023-12-04 MED ORDER — METOCLOPRAMIDE HCL 5 MG PO TABS: 5.0000 mg | ORAL_TABLET | Freq: Three times a day (TID) | ORAL | Status: DC | PRN

## 2023-12-04 MED ORDER — DIPHENHYDRAMINE HCL 12.5 MG/5ML PO ELIX
12.5000 mg | ORAL_SOLUTION | ORAL | Status: DC | PRN
  Administered 2023-12-04: 25 mg via ORAL
  Filled 2023-12-04: qty 10

## 2023-12-04 MED ORDER — PHENOL 1.4 % MT LIQD: 1.0000 | OROMUCOSAL | Status: DC | PRN

## 2023-12-04 NOTE — Progress Notes (Addendum)
 PROGRESS NOTE    Sherri  Jone Panebianco [name]  FMW:[id number] DOB: [birth year] DOA: 11/26/2023 PCP: [name] PARAS, MD  No chief complaint on file.   Brief Narrative:   Sherri  Creta Dorame [name] is an 83 y.o. 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   Assessment & Plan:   Principal Problem:   Wound dehiscence Active Problems:   Hyponatremia   Hyperlipidemia   Multiple sclerosis (HCC)   Essential hypertension   Rheumatoid arthritis (HCC)   Macrocytic anemia   Cellulitis of right elbow  Right elbow visible hardware with Wound dehiscence with cellulitis: Orthopedic surgery to recommend further antibiotics. Status post I&D of the right elbow on 11/27/2023 and hardware removal. Blood cx NGTD x5  on oral Doxy and cefuroxime , plan for 4 to 6 weeks. Orthopedic surgery recommended aspirin  for 4 weeks. Follow-up with orthopedic surgery in 2 weeks. PT eval is pending.   Superior Dislocation of R hip Arthroplasty S/p closed reduction on 7/7 S/p revision of R THA, head ball and liner exchange Pending intra-op cultures  Dysphagia SLP recommending nectar thick liquids, she's not willing to do this  Hypovolemic hyponatremia: trend   Hypokalemia: improved   Macrocytic anemia/acute blood loss anemia Normal B12, folate Iron studies suggestive of AOCD   Rheumatoid arthritis: Continue methotrexate  and prednisone .   History of multiple sclerosis: She is no longer on these medication.   Chronic pain: Continue Cymbalta .   Urinary retention: Continue oxybutynin .   History of recent right hip surgery in May 2025: Family planning for home   Diffuse  Ecchymoses Bruises all over Platelets ok, will monitor Continue to monitor, may need follow up with derm and/or hematology  Goals of care: Seen by palliative 7/5, they've signed off    DVT prophylaxis: SCD Code Status: full Family Communication: daughter at bedside Disposition:   Status is: Inpatient Remains inpatient appropriate because: need for ongoing care   Consultants:  orthopedics  Procedures:  7/7 Closed reduction right hip.   7/5  Irrigation debridement of right elbow with hardware removal olecranon excision and triceps advancement   Antimicrobials:  Anti-infectives (From admission, onward)    Start     Dose/Rate Route Frequency Ordered Stop   12/04/23 0800  vancomycin  (VANCOCIN ) IVPB 1000 mg/200 mL premix        1,000 mg 200 mL/hr over 60 Minutes Intravenous Every 12 hours 12/04/23 0508 12/04/23 0925   12/03/23 1639  vancomycin  (VANCOCIN ) powder  Status:  Discontinued          As needed 12/03/23 1639 12/03/23 1810   12/03/23 1300  ceFAZolin  (ANCEF ) IVPB 2g/100 mL premix        2 g 200 mL/hr over 30 Minutes Intravenous On call to O.R. 12/03/23 1241 12/03/23 1512   12/03/23 1300  vancomycin  (VANCOCIN ) IVPB 1000 mg/200 mL premix        1,000 mg 200 mL/hr over 60 Minutes Intravenous On call to O.R. 12/03/23 1241 12/03/23 1403   12/03/23 1257  ceFAZolin  (ANCEF ) 2-4 GM/100ML-% IVPB       Note to Pharmacy: Sonny Mays B: cabinet override      12/03/23 1257 12/03/23 1524   11/30/23 1700  cefUROXime  (CEFTIN ) tablet 500 mg  500 mg Oral 2 times daily with meals 11/30/23 1021 12/14/23 1659   11/30/23 1100  doxycycline  (VIBRA -TABS) tablet 100 mg  Status:  Discontinued        100 mg Oral Every 12 hours 11/30/23 1005 11/30/23 1021   11/30/23 1100  doxycycline  (VIBRA -TABS) tablet 100 mg        100 mg Oral Every 12 hours 11/30/23 1021 12/14/23 0959   11/28/23 2200  ceFAZolin  (ANCEF ) IVPB 2g/100 mL premix        2 g 200 mL/hr over 30 Minutes Intravenous Every  8 hours 11/28/23 1714 11/29/23 0558   11/28/23 1453  vancomycin  (VANCOCIN ) powder  Status:  Discontinued          As needed 11/28/23 1453 11/28/23 1553   11/28/23 1315  ceFAZolin  (ANCEF ) IVPB 2g/100 mL premix        2 g 200 mL/hr over 30 Minutes Intravenous  Once 11/28/23 1303 11/28/23 1421   11/28/23 1315  vancomycin  (VANCOCIN ) IVPB 1000 mg/200 mL premix  Status:  Discontinued        1,000 mg 200 mL/hr over 60 Minutes Intravenous  Once 11/28/23 1303 11/28/23 1645   11/28/23 0000  cefTRIAXone  (ROCEPHIN ) 1 g in sodium chloride  0.9 % 100 mL IVPB  Status:  Discontinued        1 g 200 mL/hr over 30 Minutes Intravenous Every 24 hours 11/27/23 0702 11/27/23 0856   11/27/23 1100  ceFAZolin  (ANCEF ) IVPB 2g/100 mL premix  Status:  Discontinued        2 g 200 mL/hr over 30 Minutes Intravenous On call to O.R. 11/27/23 0216 11/28/23 0559   11/27/23 1100  vancomycin  (VANCOCIN ) IVPB 1000 mg/200 mL premix  Status:  Discontinued        1,000 mg 200 mL/hr over 60 Minutes Intravenous On call to O.R. 11/27/23 0216 11/28/23 0559   11/27/23 0900  cefTRIAXone  (ROCEPHIN ) 1 g in sodium chloride  0.9 % 100 mL IVPB  Status:  Discontinued        1 g 200 mL/hr over 30 Minutes Intravenous Every 24 hours 11/27/23 0856 11/30/23 1050   11/27/23 0600  cephALEXin  (KEFLEX ) capsule 500 mg  Status:  Discontinued        500 mg Oral 3 times daily 11/27/23 0519 11/27/23 0702   11/27/23 0430  ceFAZolin  (ANCEF ) IVPB 2g/100 mL premix  Status:  Discontinued        2 g 200 mL/hr over 30 Minutes Intravenous  Once 11/27/23 0422 11/27/23 0517       Subjective: No new complaints Daughter at bedside  Objective: Vitals:   12/03/23 2121 12/04/23 0433 12/04/23 0742 12/04/23 1000  BP: 125/82 106/60 (!) 144/70   Pulse: (!) 110 (!) 104 (!) 109   Resp: 16 16 16    Temp: (!) 97.4 F (36.3 C) 98.2 F (36.8 C) (!) 97.4 F (36.3 C)   TempSrc: Oral Oral    SpO2: 99% 100% 100% 100%  Weight:      Height:        Intake/Output  Summary (Last 24 hours) at 12/04/2023 1437 Last data filed at 12/04/2023 1400 Gross per 24 hour  Intake 1850 ml  Output 1560 ml  Net 290 ml   Filed Weights   11/26/23 2012 11/28/23 1238 12/03/23 1244  Weight: 48.6 kg 48.6 kg 48.6 kg    Examination:  General: No acute distress. Cardiovascular: RRR Lungs:  unlabored Abdomen: Soft, nontender, nondistended  Neurological: Alert and oriented 3. Moves all extremities  4 . Cranial nerves II through XII grossly intact. Extremities: dressing to RLE, splint to RUE   Data Reviewed: I have personally reviewed following labs and imaging studies  CBC: Recent Labs  Lab 11/29/23 0648 11/29/23 2048 11/30/23 1005 12/03/23 0726 12/04/23 0628  WBC 13.1*  --  9.1 6.1 10.5  NEUTROABS  --   --  7.8* 4.1 8.4*  HGB 6.6* 10.0* 10.8* 8.8* 8.7*  HCT 20.8* 31.2* 33.2* 27.2* 27.9*  MCV 107.8*  --  99.4 99.6 104.9*  PLT 322  --  309 283 243    Basic Metabolic Panel: Recent Labs  Lab 11/28/23 0419 11/29/23 0502 11/30/23 0508 12/03/23 0726  NA 133* 133* 135 136  K 3.4* 4.7 3.7 3.8  CL 99 102 101 101  CO2 27 24 25 27   GLUCOSE 85 132* 69* 90  BUN 7* 5* <5* 6*  CREATININE 0.65 0.53 0.62 0.64  CALCIUM  7.6* 7.6* 8.0* 8.0*  MG  --   --   --  1.8  PHOS  --   --   --  2.9    GFR: Estimated Creatinine Clearance: 41.6 mL/min (by C-G formula based on SCr of 0.64 mg/dL).  Liver Function Tests: Recent Labs  Lab 12/03/23 0726  AST 19  ALT 9  ALKPHOS 57  BILITOT 0.8  PROT 4.9*  ALBUMIN  2.1*    CBG: No results for input(s): GLUCAP in the last 168 hours.   Recent Results (from the past 240 hours)  Culture, blood (Routine X 2) w Reflex to ID Panel     Status: None   Collection Time: 11/27/23  8:26 AM   Specimen: BLOOD RIGHT HAND  Result Value Ref Range Status   Specimen Description BLOOD RIGHT HAND  Final   Special Requests   Final    BOTTLES DRAWN AEROBIC ONLY Blood Culture results may not be optimal due to an inadequate volume of  blood received in culture bottles   Culture   Final    NO GROWTH 5 DAYS Performed at [facility], [address], [place], [place] [address]    Report Status 12/02/2023 FINAL  Final  Culture, blood (Routine X 2) w Reflex to ID Panel     Status: None   Collection Time: 11/27/23  8:27 AM   Specimen: BLOOD LEFT ARM  Result Value Ref Range Status   Specimen Description BLOOD LEFT ARM  Final   Special Requests   Final    BOTTLES DRAWN AEROBIC AND ANAEROBIC Blood Culture adequate volume   Culture   Final    NO GROWTH 5 DAYS Performed at [facility], [address] [place], [place] [address]    Report Status 12/02/2023 FINAL  Final  Surgical PCR screen     Status: None   Collection Time: 12/03/23 10:24 AM   Specimen: Nasal Mucosa; Nasal Swab  Result Value Ref Range Status   MRSA, PCR NEGATIVE NEGATIVE Final   Staphylococcus aureus NEGATIVE NEGATIVE Final    Comment: (NOTE) The Xpert SA Assay (FDA approved for NASAL specimens in patients 55 years of age and older), is one component of [facility]ber[facility] Demello comprehensive surveillance program. It is not intended to diagnose infection nor to guide or monitor treatment. Performed at [facility], [address], [place], [place] [address]   Aerobic/Anaerobic Culture w Gram Stain (surgical/deep wound)     Status: None (Preliminary result)   Collection Time: 12/03/23  4:00 PM   Specimen: Synovial, Right  Hip; Body Fluid  Result Value Ref Range Status   Specimen Description   Final    SYNOVIAL RIGHT HIP Performed at [facility], [address], [place], [place] [address]    Special Requests   Final    NONE Performed at [facility], [address], [place], [place] [address]    Gram Stain   Final    RARE WBC PRESENT, PREDOMINANTLY PMN NO ORGANISMS SEEN    Culture   Final    NO GROWTH < 12 HOURS Performed at [facility], [address], [place], [place] [address]    Report Status  PENDING  Incomplete  Aerobic/Anaerobic Culture w Gram Stain (surgical/deep wound)     Status: None (Preliminary result)   Collection Time: 12/03/23  4:00 PM   Specimen: Synovial, Right Hip; Body Fluid  Result Value Ref Range Status   Specimen Description   Final    SYNOVIAL RIGHT HIP Performed at [facility], [address] [place], [place] [address]    Special Requests   Final    NONE Performed at [facility], [address] [place], [place] [address]    Gram Stain NO WBC SEEN NO ORGANISMS SEEN   Final   Culture   Final    NO GROWTH < 12 HOURS Performed at [facility], [address] [place], [place] [address]    Report Status PENDING  Incomplete  Aerobic Culture w Gram Stain (superficial specimen)     Status: None (Preliminary result)   Collection Time: 12/03/23  4:00 PM   Specimen: Synovium  Result Value Ref Range Status   Specimen Description SYNOVIAL  Final   Special Requests RT HIP SYNOVIAL FL 1  Final   Gram Stain   Final    RARE WBC PRESENT, PREDOMINANTLY PMN NO ORGANISMS SEEN    Culture   Final    NO GROWTH < 12 HOURS Performed at [facility], [address], [place], [place] [address]    Report Status PENDING  Incomplete  Aerobic Culture w Gram Stain (superficial specimen)     Status: None (Preliminary result)   Collection Time: 12/03/23  4:00 PM   Specimen: Synovium  Result Value Ref Range Status   Specimen Description SYNOVIAL  Final   Special Requests RT HIP SUB FL 1  Final   Gram Stain   Final    RARE WBC PRESENT, PREDOMINANTLY PMN NO ORGANISMS SEEN    Culture   Final    NO GROWTH < 12 HOURS Performed at [facility], [address] [place], [place] [address]    Report Status PENDING  Incomplete         Radiology Studies: DG Pelvis Portable Result Date: 12/03/2023 CLINICAL DATA:  Status post revision of right total hip replacement. EXAM: PORTABLE PELVIS 1-2 VIEWS COMPARISON:  November 30, 2023. FINDINGS: Status post bilateral total hip arthroplasties. No fracture or dislocation is noted. Expected postoperative changes seen around the right hip. IMPRESSION: Postsurgical changes as noted above. Electronically Signed   By: Lynwood Landy Raddle M.D.   On: 12/03/2023 19:37   DG HIP UNILAT WITH PELVIS 1V RIGHT Result Date: 12/03/2023 CLINICAL DATA:  Right total hip arthroplasty. EXAM: DG HIP (WITH OR WITHOUT PELVIS) 1V RIGHT; DG C-ARM 1-60 MIN-NO REPORT Radiation exposure index: 0.6844 mGy. COMPARISON:  November 30, 2023. FINDINGS: Two intraoperative fluoroscopic images were obtained of the right hip.  Right acetabular and femoral components are well situated. IMPRESSION: Status post right total hip arthroplasty.  No dislocation is noted. Electronically Signed   By: Lynwood Landy Raddle M.D.   On: 12/03/2023 18:12   DG C-Arm 1-60 Min-No Report Result Date: 12/03/2023 Fluoroscopy was utilized by the requesting physician.  No radiographic interpretation.   DG C-Arm 1-60 Min-No Report Result Date: 12/03/2023 Fluoroscopy was utilized by the requesting physician.  No radiographic interpretation.         Scheduled Meds:  atorvastatin   10 mg Oral Daily   carvedilol   3.125 mg Oral BID WC   cefUROXime   500 mg Oral BID WC   Chlorhexidine  Gluconate Cloth  6 each Topical Daily   cyclobenzaprine   10 mg Oral QHS   docusate sodium   100 mg Oral Daily   doxycycline   100 mg Oral Q12H   DULoxetine   20 mg Oral QHS   feeding supplement  237 mL Oral BID BM   folic acid   1 mg Oral Daily   pantoprazole   40 mg Oral BID AC   predniSONE   5 mg Oral Q breakfast   senna  1 tablet Oral BID   Continuous Infusions:  sodium chloride  75 mL/hr at 12/04/23 0727     LOS: 7 days    Time spent: over 30 min    Meliton Monte, MD [facility]   To contact the attending provider between 7A-7P or the covering provider during after hours 7P-7A, please log into the web site [email or web address] and access using  universal [place] password for that web site. If you do not have the password, please call the hospital operator.  12/04/2023, 2:37 PM

## 2023-12-04 NOTE — Progress Notes (Signed)
 SLP Contact  Patient Details Name: Sherri [name] MRN: [id number] DOB: 04-21-1941   Chart review completed. Noted pt initiated a full liquid - thin liquid - diet this AM. Given results of recent MBSS, diet amended to full liquid - nectar-thick liquids. SLP to f/u for tolerance as able. RN made aware of the above via Secure Chat.  Delon Bangs, M.S., CCC-SLP Speech-Language Pathologist [facility] [phone] [place])  [name] 12/04/2023, 8:18 AM

## 2023-12-04 NOTE — Progress Notes (Signed)
 Subjective:  Patient reports pain as mild.  Denies N/V/CP/SOB/Abd pain. She is on O2 via nasal cannula this morning.  She reports pain well controlled.  Daughter at bedside.  She reports not much pain in her hip and she has been moving it on her own.  Discussed with daughter Prevena incisional dressing with portable wound vac and JP drain.  House vac was not plugged in this morning with low battery, discussed with RN to make sure plugged in and charging to maintain seal.   Objective:   VITALS:   Vitals:   12/03/23 2121 12/04/23 0433 12/04/23 0742 12/04/23 1000  BP: 125/82 106/60 (!) 144/70   Pulse: (!) 110 (!) 104 (!) 109   Resp: 16 16 16    Temp: (!) 97.4 F (36.3 C) 98.2 F (36.8 C) (!) 97.4 F (36.3 C)   TempSrc: Oral Oral    SpO2: 99% 100% 100% 100%  Weight:      Height:        NAD Neurologically intact ABD soft Neurovascular intact Sensation intact distally Intact pulses distally Dorsiflexion/Plantar flexion intact Compartment soft Prevena incisional dressing C/D/I, no leaks detected. House vac was not plugged in upon arrival this morning, plugged in with low battery thankfully in time. Continue on house vac until discharge.  JP dressing with drainage. Changed dressing this morning with dry gauze and Tegaderm. Output >250 SS fluid. Continue drain.  She has swelling and ecchymosis at different stages of healing with dressed skin tears throughout RLE with dressings maintained, no new wounds today.  She is moving RLE on her own this morning. Leg lengths appropriate, no shortening or ER noted.   RUE splint intact, well maintained.   Lab Results  Component Value Date   WBC 10.5 12/04/2023   HGB 8.7 (L) 12/04/2023   HCT 27.9 (L) 12/04/2023   MCV 104.9 (H) 12/04/2023   PLT 243 12/04/2023   BMET    Component Value Date/Time   NA 136 12/03/2023 0726   NA 139 06/20/2019 1335   NA 136 02/22/2015 0000   K 3.8 12/03/2023 0726   K 4.4 02/22/2015 0000    CL 101 12/03/2023 0726   CL 98 02/22/2015 0000   CO2 27 12/03/2023 0726   CO2 30 02/22/2015 0000   GLUCOSE 90 12/03/2023 0726   BUN 6 (L) 12/03/2023 0726   BUN 10 06/20/2019 1335   CREATININE 0.64 12/03/2023 0726   CREATININE 1.03 (H) 08/06/2023 1510   CALCIUM  8.0 (L) 12/03/2023 0726   CALCIUM  9.6 02/22/2015 0000   EGFR 55 (L) 08/06/2023 1510   GFRNONAA >60 12/03/2023 0726   GFRNONAA 56 (L) 09/26/2020 1536   Results for orders placed or performed during the hospital encounter of 11/26/23  Culture, blood (Routine X 2) w Reflex to ID Panel     Status: None   Collection Time: 11/27/23  8:26 AM   Specimen: BLOOD RIGHT HAND  Result Value Ref Range Status   Specimen Description BLOOD RIGHT HAND  Final   Special Requests   Final    BOTTLES DRAWN AEROBIC ONLY Blood Culture results may not be optimal due to an inadequate volume of blood received in culture bottles   Culture   Final    NO GROWTH 5 DAYS Performed at [facility], [address], [place], [place] [address]    Report Status 12/02/2023 FINAL  Final  Culture, blood (Routine X 2) w Reflex to ID Panel  Status: None   Collection Time: 11/27/23  8:27 AM   Specimen: BLOOD LEFT ARM  Result Value Ref Range Status   Specimen Description BLOOD LEFT ARM  Final   Special Requests   Final    BOTTLES DRAWN AEROBIC AND ANAEROBIC Blood Culture adequate volume   Culture   Final    NO GROWTH 5 DAYS Performed at [facility], [address], [place], [place] [address]    Report Status 12/02/2023 FINAL  Final  Surgical PCR screen     Status: None   Collection Time: 12/03/23 10:24 AM   Specimen: Nasal Mucosa; Nasal Swab  Result Value Ref Range Status   MRSA, PCR NEGATIVE NEGATIVE Final   Staphylococcus aureus NEGATIVE NEGATIVE Final    Comment: (NOTE) The Xpert SA Assay (FDA approved for NASAL specimens in patients 77 years of age and older), is one component of a comprehensive surveillance program. It is not intended to  diagnose infection nor to guide or monitor treatment. Performed at [facility], [address], [place], [place] [address]   Aerobic/Anaerobic Culture w Gram Stain (surgical/deep wound)     Status: None (Preliminary result)   Collection Time: 12/03/23  4:00 PM   Specimen: Synovial, Right Hip; Body Fluid  Result Value Ref Range Status   Specimen Description   Final    SYNOVIAL RIGHT HIP Performed at [facility], [address], [place], [place] [address]    Special Requests   Final    NONE Performed at [facility], [address] [place], [place] [address]    Gram Stain   Final    RARE WBC PRESENT, PREDOMINANTLY PMN NO ORGANISMS SEEN    Culture   Final    NO GROWTH < 12 HOURS Performed at [facility], [address], [place], [place] [address]    Report Status PENDING  Incomplete  Aerobic/Anaerobic Culture w Gram Stain (surgical/deep wound)     Status: None (Preliminary result)   Collection Time: 12/03/23  4:00 PM   Specimen: Synovial, Right Hip; Body Fluid  Result Value Ref Range Status   Specimen Description   Final    SYNOVIAL RIGHT HIP Performed at [facility], [address], [place], [place] [address]    Special Requests   Final    NONE Performed at [facility], [address] [place], [place] [address]    Gram Stain NO WBC SEEN NO ORGANISMS SEEN   Final   Culture   Final    NO GROWTH < 12 HOURS Performed at [facility] [place] [facility], [address], [place], [place] [address]    Report Status PENDING  Incomplete  Aerobic Culture w Gram Stain (superficial specimen)     Status: None (Preliminary result)   Collection Time: 12/03/23  4:00 PM   Specimen: Synovium  Result Value Ref Range Status   Specimen Description SYNOVIAL  Final   Special Requests RT HIP SYNOVIAL FL 1  Final   Gram Stain   Final    RARE WBC PRESENT, PREDOMINANTLY PMN NO ORGANISMS SEEN    Culture   Final     NO GROWTH < 12 HOURS Performed at [facility], [address] [place], [place] [address]    Report Status PENDING  Incomplete  Aerobic Culture w Gram Stain (superficial specimen)     Status: None (Preliminary result)   Collection Time: 12/03/23  4:00 PM  Specimen: Synovium  Result Value Ref Range Status   Specimen Description SYNOVIAL  Final   Special Requests RT HIP SUB FL 1  Final   Gram Stain   Final    RARE WBC PRESENT, PREDOMINANTLY PMN NO ORGANISMS SEEN    Culture   Final    NO GROWTH < 12 HOURS Performed at [facility], [address], [place], [place] [address]    Report Status PENDING  Incomplete   *Note: Due to a large number of results and/or encounters for the requested time period, some results have not been displayed. A complete set of results can be found in Results Review.     Assessment/Plan: 1 Day Post-Op   Principal Problem:   Wound dehiscence Active Problems:   Hyperlipidemia   Multiple sclerosis (HCC)   Essential hypertension   Rheumatoid arthritis (HCC)   Hyponatremia   Macrocytic anemia   Cellulitis of right elbow  ABLA. Hemoglobin 8.7. Continue to monitor. Aspirin  held due to high bleeding risk.   Intraoperative cultures, grain stains negative, culture results pending. Continue to monitor.   WBAT with walker RLE, posterior hip precautions. RUE per Dr. Edna, partial weightbearing, okay to use walker.  DVT ppx: Aspirin  held due to high bleeding risk, SCDs, TEDS PO pain control PT/OT: Mobilize with PT as tolerated.  Dispo: - Patient under care of the medical team, disposition per their recommendation.  - Mobilize out of bed with PT.  - Follow IntraOp cultures.  - We will discontinue the JP drain when output is less than 10 cc per shift, okay to go home with if high output.  - Continue negative pressure dressing at 75 mmHg. Upon discharge, the house VAC suction unit will need to be exchanged for a portable Prevena  suction unit.  - She will need to follow-up in the office within 7 days of discharge for removal of her negative pressure dressing.    Valery GORMAN Potters 12/04/2023, 1:53 PM   [facility]  [place] [address], [place], [place] [address] Phone: [phone] [email or web address] Facebook  [employer]

## 2023-12-04 NOTE — Evaluation (Signed)
 Physical Therapy Evaluation Patient Details Name: Sherri [name] MRN: [id number] DOB: 06-20-1940 Today's Date: 12/04/2023  History of Present Illness  Patient is 83 y.o. female presented to ED 11/26/23 due to visible hardware at Rt elbow fx. Pt s/p I&D of right elbow with hardware removal, olecranon excision, and triceps advancement 7/5. Pt noted to have a shortened RLE and X-ray confirmed R hip redislocation 7/7. Taken to the OR and reduction attempt was unsuccessful 7/7. Pt s/p R THA revision with head ball and liner exchange 7/10. 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 per [facility] note from 11/18/23, WBAT RLE with walker, hip abduction brace at all times, posterior hip precautions x 6 weeks. PMH significant for anxiety, GERD, HLD, HTN, MS, HOH, back pain, ACDF C7-T1.   Clinical Impression  Pt admitted with above diagnosis. Pt's PLOF was independent with functional mobility, ADLs/IADLs, and driving. She lives with her husband in a two story house with a ramped entrance and is able to reside on the main level. Pt has 24/7 supervision/assist provided by caregivers. Pt currently with functional limitations due to the deficits listed below (see PT Problem List). She required minA x2 for bed mobility, minA x2 for sit<>stand using RW, and modA x2 for bed>chair transfer using RW. Educated pt and her daughter on RUE/RLE WBing status and posterior hip precautions. Pt required cues for sequencing throughout mobility. She displayed poor safety awareness and limited insight into current condition. Pt will benefit from acute skilled PT to increase her  independence and safety with mobility to allow discharge. Recommend intensive inpatient follow-up therapy, >3 hours/day.    If plan is discharge home, recommend the following: Two people to help with walking and/or transfers;A lot of help with bathing/dressing/bathroom;Assistance with cooking/housework;Assist for transportation;Help with stairs or ramp for entrance   Can travel by private vehicle   No    Equipment Recommendations None recommended by PT (Pt already has DME)  Recommendations for Other Services  OT consult    Functional Status Assessment Patient has had a recent decline in their functional status and demonstrates the ability to make significant improvements in function in a reasonable and predictable amount of time.     Precautions / Restrictions Precautions Precautions: Fall;Posterior Hip Precaution Booklet Issued: No Recall of Precautions/Restrictions: Impaired Precaution/Restrictions Comments: Reviewed WBing status and posterior hip precautions; Rt JP drain and wound vac RLE; watch BP Required Braces or Orthoses: Splint/Cast Splint/Cast: RUE Restrictions Weight Bearing Restrictions Per Provider Order: Yes RUE Weight Bearing Per Provider Order: Partial weight bearing RUE Partial Weight Bearing Percentage or Pounds: okay to use walker RLE Weight Bearing Per Provider Order: Weight bearing as tolerated      Mobility  Bed Mobility Overal bed mobility: Needs Assistance Bed Mobility: Supine to Sit     Supine to sit: HOB elevated, Used rails, Min assist, +2 for physical assistance     General bed mobility comments: Pt sat up on R side of bed with increased time and cues for sequencing. Assist to bring BLE off EOB and elevate trunk with use of bed pad. Scooted pt fwd til feet flat with bed pad.    Transfers  Overall transfer level: Needs assistance Equipment used: Rolling walker (2 wheels) Transfers: Sit to/from Stand, Bed to chair/wheelchair/BSC Sit to Stand:  From elevated surface, Min assist, +2 physical assistance Stand pivot transfers: From elevated surface, Mod assist, +2 physical assistance         General transfer comment: Pt stood from raised bed height. Cues for proper hand placement using RW. Powered up with minAx2. Transferred to recliner chair on her left with modA x2, assist to manuever RW, and facilitate pivot turn. Good eccentric control.    Ambulation/Gait               General Gait Details: Unable  Stairs            Wheelchair Mobility     Tilt Bed    Modified Rankin (Stroke Patients Only)       Balance Overall balance assessment: Needs assistance, History of Falls Sitting-balance support: Feet supported, No upper extremity supported Sitting balance-Leahy Scale: Fair Sitting balance - Comments: Pt sat EOB with supervision-CGA.   Standing balance support: Bilateral upper extremity supported, During functional activity, Reliant on assistive device for balance Standing balance-Leahy Scale: Poor Standing balance comment: Pt dependent on RW and +2 assist.                             Pertinent Vitals/Pain Pain Assessment Pain Assessment: 0-10 Pain Score: 6  Pain Location: R hip/thigh Pain Descriptors / Indicators: Operative site guarding, Discomfort, Aching Pain Intervention(s): RN gave pain meds during session, Monitored during session, Limited activity within patient's tolerance, Repositioned    Home Living Family/patient expects to be discharged to:: Private residence Living Arrangements: Spouse/significant other (Husband) Available Help at Discharge: Family;Available PRN/intermittently;Available 24 hours/day;Personal care attendant (Husband couldn't physically assist pt but could help with houshold management; Son leaves nearby; Daughter reports 24/7 aides) Type of Home: House Home Access: Ramped entrance       Home Layout: Two level;Able to live on main level with  bedroom/bathroom Home Equipment: Rollator (4 wheels);BSC/3in1;Shower seat - built in;Grab bars - tub/shower;Wheelchair - Forensic psychologist (2 wheels);Other (comment) (adjustable bed)      Prior Function Prior Level of Function : Independent/Modified Independent;Driving             Mobility Comments: Prior to May, independent without AD. Extensive fall history, all resulting in injuries. Pt reports at least 4 falls in the past 2-3 months. ADLs Comments: Indep with ADLs. Manages her own medications. Cooks/Cleans. Driving. Assists her husband prn.     Extremity/Trunk Assessment   Upper Extremity Assessment Upper Extremity Assessment: Defer to OT evaluation    Lower Extremity Assessment Lower Extremity Assessment: Generalized weakness;RLE deficits/detail RLE Deficits / Details: Pt demonstrated WFL ankle ROM ad limited hip/knee ROM. Grossly 3-/5 strength. RLE: Unable to fully assess due to pain RLE Sensation: WNL RLE Coordination: decreased gross motor    Cervical / Trunk Assessment Cervical / Trunk Assessment: Kyphotic  Communication   Communication Communication: Impaired Factors Affecting Communication: Hearing impaired    Cognition Arousal: Alert Behavior During Therapy: WFL for tasks assessed/performed   PT - Cognitive impairments: Initiation, Sequencing, Problem solving, Safety/Judgement                       PT - Cognition Comments: Pt A,Ox4. She required moderate cues to sequence tasks. Pt attempted to sit too soon while transferring, required modA x2 to correct. Following commands: Intact  Cueing Cueing Techniques: Verbal cues, Gestural cues     General Comments General comments (skin integrity, edema, etc.): Pt c/o dizziness throughout session, worsening to a swimmy headed feeling in standing. BP: seated EOB 140/70; post-transfer 94/63.    Exercises     Assessment/Plan    PT Assessment Patient needs continued PT services  PT Problem  List Decreased strength;Decreased range of motion;Decreased activity tolerance;Decreased balance;Decreased mobility;Decreased knowledge of use of DME;Pain;Decreased coordination;Decreased safety awareness;Decreased knowledge of precautions       PT Treatment Interventions DME instruction;Gait training;Functional mobility training;Therapeutic activities;Therapeutic exercise;Patient/family education    PT Goals (Current goals can be found in the Care Plan section)  Acute Rehab PT Goals Patient Stated Goal: Get better and be independent again PT Goal Formulation: With patient/family Time For Goal Achievement: 12/18/23 Potential to Achieve Goals: Good    Frequency Min 3X/week     Co-evaluation               AM-PAC PT 6 Clicks Mobility  Outcome Measure Help needed turning from your back to your side while in a flat bed without using bedrails?: Total Help needed moving from lying on your back to sitting on the side of a flat bed without using bedrails?: Total Help needed moving to and from a bed to a chair (including a wheelchair)?: Total Help needed standing up from a chair using your arms (e.g., wheelchair or bedside chair)?: Total Help needed to walk in hospital room?: Total Help needed climbing 3-5 steps with a railing? : Total 6 Click Score: 6    End of Session Equipment Utilized During Treatment: Gait belt Activity Tolerance: Patient tolerated treatment well;Patient limited by fatigue Patient left: in chair;with call bell/phone within reach;with chair alarm set;with family/visitor present Nurse Communication: Mobility status PT Visit Diagnosis: Difficulty in walking, not elsewhere classified (R26.2);Other abnormalities of gait and mobility (R26.89);Muscle weakness (generalized) (M62.81);Unsteadiness on feet (R26.81);History of falling (Z91.81);Repeated falls (R29.6)    Time: 8583-8544 PT Time Calculation (min) (ACUTE ONLY): 39 min   Charges:   PT Evaluation $PT Eval  Moderate Complexity: 1 Mod PT Treatments $Therapeutic Activity: 8-22 mins PT General Charges $$ ACUTE PT VISIT: 1 Visit         Randall SAUNDERS, PT, DPT Acute Rehabilitation Services Office: [phone] Secure Chat Preferred  Sherri [name] 12/04/2023, 3:52 PM

## 2023-12-04 NOTE — Progress Notes (Signed)
  Inpatient Rehab Admissions Coordinator :  Per therapy recommendations, patient was screened for CIR candidacy by Ottie Glazier RN MSN.  At this time patient appears to be a potential candidate for CIR. I will place a rehab consult per protocol for full assessment. Please call me with any questions.  Ottie Glazier RN MSN Admissions Coordinator [phone]

## 2023-12-04 NOTE — Progress Notes (Signed)
 Speech Language Pathology Treatment: Dysphagia  Patient Details Name: Sherri [name]  KATENA PETITJEAN MRN: [id number] DOB: August 23, 1940 Today's Date: 12/04/2023 Time: 9051-8989 SLP Time Calculation (min) (ACUTE ONLY): 22 min  Assessment / Plan / Recommendation Clinical Impression  Pt seen for clinical swallowing re-evaluation given chronic dysphagia and recent revision of R THA. Dysphonia noted and started after sx per pt and daughter. Pt observed with regular solids, puree, and nectar-thick liquids. Intermittent throat clearing observed with nectar-thick liquids and pt with complaints of solids sticking which appears c/w pt's baseline as well as findings from recent MBSS. Reviewed results of recent MBSS. Recommend resuming a regular diet for maximal choice (while avoiding textures of solids she finds problematic; IDDSI 5 recommended per MBSS), and nectar-thick liquids. Consider water  protocol in the near future. SLP to continue to f/u.    HPI HPI: Pt is an 83 y.o. female who presented to the ED 7/4 due to concerns for exposed surgical hardware from previous elbow surgery in June. SLP consulted due to RN's observance of coughing with liquids. MBSS 11/30/23 recommended IDDSI 5 with nectar-thick liquids. [name] on 12/03/23.  PMH: hypertension, rheumatoid arthritis, chronic pain, urinary retention.      SLP Plan  Continue with current plan of care          Recommendations  Diet recommendations: Regular;Nectar-thick liquid Liquids provided via: Cup;Straw Medication Administration: Whole meds with puree (vs crushed) Supervision: Patient able to self feed;Intermittent supervision to cue for compensatory strategies (set up) Compensations: Slow rate;Small sips/bites;Follow solids with liquid Postural Changes and/or Swallow Maneuvers: Seated upright 90 degrees                  Oral care BID     Dysphagia, unspecified (R13.10)     Continue with current plan of care    Delon Bangs, M.S., CCC-SLP Speech-Language Pathologist Secure Chat Preferred  O: [phone]   [name]  12/04/2023, 10:17 AM

## 2023-12-05 DIAGNOSIS — T8130XA Disruption of wound, unspecified, initial encounter: Secondary | ICD-10-CM | POA: Diagnosis not present

## 2023-12-05 MED ORDER — OXYCODONE HCL 5 MG PO TABS: 5.0000 mg | ORAL_TABLET | ORAL | 0 refills | Status: DC | PRN

## 2023-12-05 MED ORDER — CEFADROXIL 500 MG PO CAPS
500.0000 mg | ORAL_CAPSULE | Freq: Two times a day (BID) | ORAL | Status: DC
  Administered 2023-12-05 – 2023-12-08 (×7): 500 mg via ORAL
  Filled 2023-12-05 (×7): qty 1

## 2023-12-05 NOTE — Evaluation (Signed)
 Occupational Therapy Evaluation Patient Details Name: Sherri [name] MRN: [id number] DOB: [birth year] Today's Date: 12/05/2023   History of Present Illness   Pt is 83 y.o. female presented to ED 11/26/23 due to visible hardware at Rt elbow fx. Pt s/p I&D of right elbow with hardware removal, olecranon excision, and triceps advancement 7/5. Pt noted to have a shortened RLE and X-ray confirmed R hip redislocation 7/7. Taken to the OR and reduction attempt was unsuccessful 7/7. Pt s/p R THA revision with head ball and liner exchange 7/10. 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 PMH significant for anxiety, GERD, HLD, HTN, MS, HOH, back pain, ACDF C7-T1.     Clinical Impressions Pt ind at baseline with ADL/functional mobility prior to May, from home where she lives with her spouse and has assist from family. Pt currently with 6/10 RLE pain, needs mod-max A for ADLs, mod A for bed mobility and max +2 for squat pivot transfer to chair on pt's L side. Pt and daughter educated on WB precautions and posterior hip precautions (handout hung up in room). Pt educated on RUE digit movement and elevating RUE on pillow for edema control. Pt presenting with impairments listed below, will follow acutely. Patient will benefit from intensive inpatient follow-up therapy, >3 hours/day to maximize safety/ind with ADL/functional mobility.      If plan is discharge home, recommend the following:   Two people to help with walking and/or transfers;A lot of help with bathing/dressing/bathroom;Assistance with cooking/housework;Direct supervision/assist for financial  management;Direct supervision/assist for medications management;Assist for transportation;Help with stairs or ramp for entrance     Functional Status Assessment   Patient has had a recent decline in their functional status and demonstrates the ability to make significant improvements in function in a reasonable and predictable amount of time.     Equipment Recommendations   Other (comment) (defer)     Recommendations for Other Services   PT consult     Precautions/Restrictions   Precautions Precautions: Fall;Posterior Hip Precaution Booklet Issued: No Recall of Precautions/Restrictions: Impaired Precaution/Restrictions Comments: R JP drain, RLE wound vac Required Braces or Orthoses: Splint/Cast Splint/Cast: RUE Restrictions Weight Bearing Restrictions Per Provider Order: Yes RUE Weight Bearing Per Provider Order: Partial weight bearing RUE Partial Weight Bearing Percentage or Pounds: okay to use walker RLE Weight Bearing Per Provider Order: Weight bearing as tolerated     Mobility Bed Mobility Overal bed mobility: Needs Assistance Bed Mobility: Supine to Sit     Supine to sit: Mod assist          Transfers Overall transfer level: Needs assistance Equipment used: 2 person hand held assist Transfers: Sit to/from Stand, Bed to chair/wheelchair/BSC     Squat pivot transfers: Max assist, +2 physical assistance              Balance Overall balance assessment: Needs assistance, History of Falls Sitting-balance support: Feet supported, No upper extremity supported Sitting balance-Leahy Scale: Fair Sitting balance - Comments: Pt sat EOB with supervision-CGA.   Standing balance support: Bilateral upper extremity supported, During functional activity, Reliant on assistive device for balance Standing balance-Leahy Scale: Poor Standing balance comment: reliant on external  support                           ADL either performed or assessed with  clinical judgement   ADL Overall ADL's : Needs assistance/impaired Eating/Feeding: Sitting;Set up   Grooming: Sitting;Brushing hair;Oral care;Wash/dry face   Upper Body Bathing: Moderate assistance;Sitting   Lower Body Bathing: Maximal assistance;Sitting/lateral leans;Sit to/from stand   Upper Body Dressing : Moderate assistance;Sitting   Lower Body Dressing: Maximal assistance;Sitting/lateral leans;Sit to/from stand   Toilet Transfer: Maximal assistance;+2 for physical assistance   Toileting- Clothing Manipulation and Hygiene: Maximal assistance       Functional mobility during ADLs: Maximal assistance;+2 for physical assistance       Vision   Vision Assessment?: No apparent visual deficits     Perception Perception: Not tested       Praxis Praxis: Not tested       Pertinent Vitals/Pain Pain Assessment Pain Assessment: Faces Pain Score: 6  Faces Pain Scale: Hurts even more Pain Location: R hip Pain Descriptors / Indicators: Operative site guarding, Discomfort, Aching Pain Intervention(s): Limited activity within patient's tolerance, Monitored during session, Repositioned     Extremity/Trunk Assessment Upper Extremity Assessment Upper Extremity Assessment: RUE deficits/detail RUE Deficits / Details: RUE with splint from wrist to elbow, can oppose digits and make a fist within limits of splint, reports mild sensation changes RUE Sensation: decreased light touch;decreased proprioception RUE Coordination: decreased fine motor;decreased gross motor   Lower Extremity Assessment Lower Extremity Assessment: Defer to PT evaluation   Cervical / Trunk Assessment Cervical / Trunk Assessment: Kyphotic   Communication Communication Communication: Impaired Factors Affecting Communication: Hearing impaired   Cognition Arousal: Alert Behavior During Therapy: WFL for tasks assessed/performed Cognition: No apparent impairments                                Following commands: Intact       Cueing  General Comments   Cueing Techniques: Verbal cues;Gestural cues  VSS   Exercises     Shoulder Instructions      Home Living Family/patient expects to be discharged to:: Private residence Living Arrangements: Spouse/significant other Available Help at Discharge: Family;Available PRN/intermittently;Available 24 hours/day;Personal care attendant (Husband couldn't physically assist pt but could help with houshold management; Son leaves nearby; Daughter reports 24/7 aides) Type of Home: House Home Access: Ramped entrance     Home Layout: Able to live on main level with bedroom/bathroom     Bathroom Shower/Tub: Walk-in shower;Tub/shower unit   Bathroom Toilet: Handicapped height Bathroom Accessibility: Yes How Accessible: Accessible via walker Home Equipment: Rollator (4 wheels);BSC/3in1;Shower seat - built in;Grab bars - tub/shower;Wheelchair - Forensic psychologist (2 wheels);Other (comment) (adjustable bed)   Additional Comments: Per dtr, 24/7 caregivers hired or family.  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  Lives With: Spouse    Prior Functioning/Environment Prior Level of Function : Independent/Modified Independent;Driving             Mobility Comments: Prior to May, independent without AD. Extensive fall history, all resulting in injuries. Pt reports at least 4 falls in the past 2-3 months. ADLs Comments: Indep with ADLs. Manages her own medications. Cooks/Cleans. Driving. Assists her husband prn.    OT Problem List: Impaired balance (sitting and/or standing);Decreased safety awareness;Decreased knowledge of precautions;Decreased activity tolerance;Decreased knowledge of use of DME or AE   OT Treatment/Interventions: Self-care/ADL training;DME and/or AE instruction;Therapeutic activities;Balance training;Energy  conservation;Patient/family education      OT Goals(Current goals can be found in the care plan section)   Acute Rehab OT  Goals Patient Stated Goal: to mobilize OT Goal Formulation: With patient Time For Goal Achievement: 12/19/23 Potential to Achieve Goals: Good ADL Goals Pt Will Perform Upper Body Dressing: with supervision;sitting Pt Will Perform Lower Body Dressing: with supervision;sit to/from stand;sitting/lateral leans Pt Will Transfer to Toilet: ambulating;regular height toilet;with contact guard assist Pt/caregiver will Perform Home Exercise Program: Increased strength;Increased ROM;Right Upper extremity;With Supervision Additional ADL Goal #1: pt will perform bed mobility CGA in prep for ADLs   OT Frequency:  Min 2X/week    Co-evaluation              AM-PAC OT 6 Clicks Daily Activity     Outcome Measure Help from another person eating meals?: None Help from another person taking care of personal grooming?: None Help from another person toileting, which includes using toliet, bedpan, or urinal?: A Lot Help from another person bathing (including washing, rinsing, drying)?: A Lot Help from another person to put on and taking off regular upper body clothing?: A Lot Help from another person to put on and taking off regular lower body clothing?: A Lot 6 Click Score: 16   End of Session Equipment Utilized During Treatment: Gait belt Nurse Communication: Mobility status  Activity Tolerance: Patient tolerated treatment well Patient left: in chair;with call bell/phone within reach;with chair alarm set  OT Visit Diagnosis: Unsteadiness on feet (R26.81);Other abnormalities of gait and mobility (R26.89);Muscle weakness (generalized) (M62.81)                Time: 8576-8540 OT Time Calculation (min): 36 min Charges:  OT General Charges $OT Visit: 1 Visit OT Evaluation $OT Eval Moderate Complexity: 1 Mod OT Treatments $Self Care/Home Management : 8-22 mins  Camika Marsico K, OTD, OTR/L SecureChat Preferred Acute Rehab [phone]   Laneta MARLA Pereyra 12/05/2023, 3:33 PM

## 2023-12-05 NOTE — Progress Notes (Signed)
 5 Day Post-Op Procedure(s) (LRB): IRRIGATION AND DEBRIDEMENT RIGHT ELBOW  Subjective: Patient reports pain as mild about the right elbow, resting prior to arrival at bedside.    New splint of right extremity holding up well.  Denies numbness or tingling of right arm.  Patient and FMs interested in CIR, eval pending.     Right hip is being managed by Dr. Fidel and his team, currently dislocated.    Objective:   VITALS:   Vitals:   12/04/23 1500 12/04/23 1950 12/05/23 0640 12/05/23 0754  BP: (!) 143/73 130/68 135/64 137/63  Pulse: 97 86 100 95  Resp: 16 18 16 14   Temp: 98 F (36.7 C) 98 F (36.7 C) 97.7 F (36.5 C) 98 F (36.7 C)  TempSrc: Oral  Axillary Oral  SpO2: 100% 98% 97% 97%  Weight:      Height:        Resting comfortably in bed in NAD Sensation intact distally Intact pulses distally Compartment soft Splint clean, dry, and intact Wiggles fingers appropriately; AIN/PIN/IO appear grossly intact No significant swelling adjacent to splint (proximally or distally), and splint understandably limits visual examination of RUE.  Skin very thin, warm, and dry.   Lab Results  Component Value Date   WBC 10.5 12/04/2023   HGB 8.7 (L) 12/04/2023   HCT 27.9 (L) 12/04/2023   MCV 104.9 (H) 12/04/2023   PLT 243 12/04/2023   BMET    Component Value Date/Time   NA 132 (L) 12/04/2023 0628   NA 139 06/20/2019 1335   NA 136 02/22/2015 0000   K 4.7 12/04/2023 0628   K 4.4 02/22/2015 0000   CL 100 12/04/2023 0628   CL 98 02/22/2015 0000   CO2 20 (L) 12/04/2023 0628   CO2 30 02/22/2015 0000   GLUCOSE 99 12/04/2023 0628   BUN 8 12/04/2023 0628   BUN 10 06/20/2019 1335   CREATININE 0.67 12/04/2023 0628   CREATININE 1.03 (H) 08/06/2023 1510   CALCIUM  7.7 (L) 12/04/2023 0628   CALCIUM  9.6 02/22/2015 0000   EGFR 55 (L) 08/06/2023 1510   GFRNONAA >60 12/04/2023 0628   GFRNONAA 56 (L) 09/26/2020 1536    Xray: Postop x-rays show interval removal of hardware with  resection of olecranon fragment   Assessment/Plan: 2 Days Post-Op   Principal Problem:   Wound dehiscence Active Problems:   Hyperlipidemia   Multiple sclerosis (HCC)   Essential hypertension   Rheumatoid arthritis (HCC)   Hyponatremia   Macrocytic anemia   Cellulitis of right elbow   Status post right elbow hardware removal with olecranon excision and triceps advancement 11/28/2023  12/01/23: Patient resting.  Looking to resume PT today.  Unsuccessful reduction by Dr. Fidel in OR yesterday see notes.  May resplint RUE for improved flexion prevention.  Discussed with Dr. Edna.  Likely SNF 12/02/23: Wound appears to be healing okay.  Splint reapplied with medial and lateral A-frame to reduce flexion capabilities.  No sling recommended but can use pillows to prop arm up for support/reduced strain.  RUE still grossly neurovascularly intact post splint application.  Can keep this on until follow up with Dr. Edna.  Still no weight bearing of RUE, plan to reassess at follow up 10-14 days post-op. 7/12: Resting.  Splint situated okay, no significant adjacent swelling.Possible CIR, pending eval.    Post op recs: WB: Immobilized in a long-arm splint, okay to use a walker for weightbearing with the right upper extremity Abx: ancef  periop and ceftriaxone   post op then dc on keflex  Imaging: PACU xrays DVT prophylaxis: Aspirin  81mg  daily for 4 weeks Follow up: 10-14 days after surgery for a wound check with Dr. Edna at [facility].  Address: [address], [place], [place] [address]  Office Phone: [phone]   Sherri [name] 12/05/2023, 8:19 AM     Contact information:   Weekdays 7am-5pm epic message Dr. Edna, or call office for patient follow up: [phone] After hours and holidays please check [email or web address] for group call information for [facility]

## 2023-12-05 NOTE — Progress Notes (Addendum)
 PROGRESS NOTE    Sherri  Paddy Walthall [name]  FMW:[id number] DOB: 1941-03-16 DOA: 11/26/2023 PCP: [name] PARAS, MD  No chief complaint on file.   Brief Narrative:   Sherri  Thurlow Gallaga [name] is an 83 y.o. 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   Assessment & Plan:   Principal Problem:   Wound dehiscence Active Problems:   Hyponatremia   Hyperlipidemia   Multiple sclerosis (HCC)   Essential hypertension   Rheumatoid arthritis (HCC)   Macrocytic anemia   Cellulitis of right elbow  Plain appears to be for inpatient rehab  Urinary Urgency Follow bladder scan, consider UA if persistent  Right elbow visible hardware with Wound dehiscence with cellulitis: Orthopedic surgery to recommend further antibiotics. Status post I&D of the right elbow on 11/27/2023 and hardware removal. Blood cx NGTD x5  Based on my discussion with Dr. Edna, he's ok with 2 weeks abx and narrowing to basic skin flora coverage (his note from 7/6 mentioned keflex ) Orthopedic surgery recommended aspirin  for 4 weeks. Follow-up with orthopedic surgery in 2 weeks. PT eval is pending.   Superior Dislocation of R hip Arthroplasty S/p closed reduction on 7/7 S/p revision of R THA, head ball and liner exchange Pending intra-op cultures  Dysphagia SLP recommending nectar thick liquids, she's not willing to do this  Hypovolemic hyponatremia: trend   Hypokalemia: improved   Macrocytic anemia/acute blood loss anemia Normal B12, folate Iron studies suggestive of AOCD   Rheumatoid arthritis: Continue methotrexate  and prednisone .   History of multiple sclerosis: She is  no longer on these medication.   Chronic pain: Continue Cymbalta .   Urinary retention: Continue oxybutynin .   History of recent right hip surgery in May 2025: Family planning for home   Diffuse Ecchymoses Bruises all over Platelets ok, will monitor Continue to monitor, may need follow up with derm and/or hematology  Goals of care: Seen by palliative 7/5, they've signed off    DVT prophylaxis: SCD Code Status: full Family Communication: none at bedside Disposition:   Status is: Inpatient Remains inpatient appropriate because: need for ongoing care   Consultants:  orthopedics  Procedures:  7/7 Closed reduction right hip.   7/5  Irrigation debridement of right elbow with hardware removal olecranon excision and triceps advancement   Antimicrobials:  Anti-infectives (From admission, onward)    Start     Dose/Rate Route Frequency Ordered Stop   12/04/23 0800  vancomycin  (VANCOCIN ) IVPB 1000 mg/200 mL premix        1,000 mg 200 mL/hr over 60 Minutes Intravenous Every 12 hours 12/04/23 0508 12/04/23 0925   12/03/23 1639  vancomycin  (VANCOCIN ) powder  Status:  Discontinued          As needed 12/03/23 1639 12/03/23 1810   12/03/23 1300  ceFAZolin  (ANCEF ) IVPB 2g/100 mL premix        2 g 200 mL/hr over 30 Minutes Intravenous On call to O.R. 12/03/23 1241 12/03/23 1512   12/03/23 1300  vancomycin  (VANCOCIN ) IVPB 1000 mg/200 mL premix        1,000 mg 200 mL/hr over 60 Minutes Intravenous On call to O.R. 12/03/23 1241 12/03/23 1403   12/03/23 1257  ceFAZolin  (ANCEF ) 2-4 GM/100ML-% IVPB  Note to Pharmacy: Sonny Mays B: cabinet override      12/03/23 1257 12/03/23 1524   11/30/23 1700  cefUROXime  (CEFTIN ) tablet 500 mg        500 mg Oral 2 times daily with meals 11/30/23 1021 12/14/23 1659   11/30/23 1100  doxycycline  (VIBRA -TABS) tablet 100 mg  Status:  Discontinued        100 mg Oral Every 12 hours 11/30/23 1005 11/30/23 1021   11/30/23 1100  doxycycline   (VIBRA -TABS) tablet 100 mg        100 mg Oral Every 12 hours 11/30/23 1021 12/14/23 0959   11/28/23 2200  ceFAZolin  (ANCEF ) IVPB 2g/100 mL premix        2 g 200 mL/hr over 30 Minutes Intravenous Every 8 hours 11/28/23 1714 11/29/23 0558   11/28/23 1453  vancomycin  (VANCOCIN ) powder  Status:  Discontinued          As needed 11/28/23 1453 11/28/23 1553   11/28/23 1315  ceFAZolin  (ANCEF ) IVPB 2g/100 mL premix        2 g 200 mL/hr over 30 Minutes Intravenous  Once 11/28/23 1303 11/28/23 1421   11/28/23 1315  vancomycin  (VANCOCIN ) IVPB 1000 mg/200 mL premix  Status:  Discontinued        1,000 mg 200 mL/hr over 60 Minutes Intravenous  Once 11/28/23 1303 11/28/23 1645   11/28/23 0000  cefTRIAXone  (ROCEPHIN ) 1 g in sodium chloride  0.9 % 100 mL IVPB  Status:  Discontinued        1 g 200 mL/hr over 30 Minutes Intravenous Every 24 hours 11/27/23 0702 11/27/23 0856   11/27/23 1100  ceFAZolin  (ANCEF ) IVPB 2g/100 mL premix  Status:  Discontinued        2 g 200 mL/hr over 30 Minutes Intravenous On call to O.R. 11/27/23 0216 11/28/23 0559   11/27/23 1100  vancomycin  (VANCOCIN ) IVPB 1000 mg/200 mL premix  Status:  Discontinued        1,000 mg 200 mL/hr over 60 Minutes Intravenous On call to O.R. 11/27/23 0216 11/28/23 0559   11/27/23 0900  cefTRIAXone  (ROCEPHIN ) 1 g in sodium chloride  0.9 % 100 mL IVPB  Status:  Discontinued        1 g 200 mL/hr over 30 Minutes Intravenous Every 24 hours 11/27/23 0856 11/30/23 1050   11/27/23 0600  cephALEXin  (KEFLEX ) capsule 500 mg  Status:  Discontinued        500 mg Oral 3 times daily 11/27/23 0519 11/27/23 0702   11/27/23 0430  ceFAZolin  (ANCEF ) IVPB 2g/100 mL premix  Status:  Discontinued        2 g 200 mL/hr over 30 Minutes Intravenous  Once 11/27/23 0422 11/27/23 0517       Subjective: No new complaints  Objective: Vitals:   12/04/23 1500 12/04/23 1950 12/05/23 0640 12/05/23 0754  BP: (!) 143/73 130/68 135/64 137/63  Pulse: 97 86 100 95  Resp: 16 18  16 14   Temp: 98 F (36.7 C) 98 F (36.7 C) 97.7 F (36.5 C) 98 F (36.7 C)  TempSrc: Oral  Axillary Oral  SpO2: 100% 98% 97% 97%  Weight:      Height:        Intake/Output Summary (Last 24 hours) at 12/05/2023 1325 Last data filed at 12/05/2023 1000 Gross per 24 hour  Intake 725 ml  Output 770 ml  Net -45 ml   Filed Weights   11/26/23 2012 11/28/23 1238 12/03/23 1244  Weight: 48.6 kg 48.6 kg  48.6 kg    Examination:  General: No acute distress. Cardiovascular: RRR Lungs: unlabored Neurological: Alert and oriented 3. Moves all extremities 4. Cranial nerves II through XII grossly intact. Extremities: RUE in splint, RLE JP drain with serosanguinous drainage  Data Reviewed: I have personally reviewed following labs and imaging studies  CBC: Recent Labs  Lab 11/29/23 0648 11/29/23 2048 11/30/23 1005 12/03/23 0726 12/04/23 0628  WBC 13.1*  --  9.1 6.1 10.5  NEUTROABS  --   --  7.8* 4.1 8.4*  HGB 6.6* 10.0* 10.8* 8.8* 8.7*  HCT 20.8* 31.2* 33.2* 27.2* 27.9*  MCV 107.8*  --  99.4 99.6 104.9*  PLT 322  --  309 283 243    Basic Metabolic Panel: Recent Labs  Lab 11/29/23 0502 11/30/23 0508 12/03/23 0726 12/04/23 0628  NA 133* 135 136 132*  K 4.7 3.7 3.8 4.7  CL 102 101 101 100  CO2 24 25 27  20*  GLUCOSE 132* 69* 90 99  BUN 5* <5* 6* 8  CREATININE 0.53 0.62 0.64 0.67  CALCIUM  7.6* 8.0* 8.0* 7.7*  MG  --   --  1.8 1.7  PHOS  --   --  2.9 3.7    GFR: Estimated Creatinine Clearance: 41.6 mL/min (by C-G formula based on SCr of 0.67 mg/dL).  Liver Function Tests: Recent Labs  Lab 12/03/23 0726 12/04/23 0628  AST 19 26  ALT 9 6  ALKPHOS 57 52  BILITOT 0.8 1.3*  PROT 4.9* 4.8*  ALBUMIN  2.1* 2.2*    CBG: No results for input(s): GLUCAP in the last 168 hours.   Recent Results (from the past 240 hours)  Culture, blood (Routine X 2) w Reflex to ID Panel     Status: None   Collection Time: 11/27/23  8:26 AM   Specimen: BLOOD RIGHT HAND  Result  Value Ref Range Status   Specimen Description BLOOD RIGHT HAND  Final   Special Requests   Final    BOTTLES DRAWN AEROBIC ONLY Blood Culture results may not be optimal due to an inadequate volume of blood received in culture bottles   Culture   Final    NO GROWTH 5 DAYS Performed at [facility], [address], [place], [place] [address]    Report Status 12/02/2023 FINAL  Final  Culture, blood (Routine X 2) w Reflex to ID Panel     Status: None   Collection Time: 11/27/23  8:27 AM   Specimen: BLOOD LEFT ARM  Result Value Ref Range Status   Specimen Description BLOOD LEFT ARM  Final   Special Requests   Final    BOTTLES DRAWN AEROBIC AND ANAEROBIC Blood Culture adequate volume   Culture   Final    NO GROWTH 5 DAYS Performed at [facility], [address] [place], [place] [address]    Report Status 12/02/2023 FINAL  Final  Surgical PCR screen     Status: None   Collection Time: 12/03/23 10:24 AM   Specimen: Nasal Mucosa; Nasal Swab  Result Value Ref Range Status   MRSA, PCR NEGATIVE NEGATIVE Final   Staphylococcus aureus NEGATIVE NEGATIVE Final    Comment: (NOTE) The Xpert SA Assay (FDA approved for NASAL specimens in patients 48 years of age and older), is one component of Anavictoria Wilk comprehensive surveillance program. It is not intended to diagnose infection nor to guide or monitor treatment. Performed at [facility], [address] [place], [place] [address]  Aerobic/Anaerobic Culture w Gram Stain (surgical/deep wound)     Status: None (Preliminary result)   Collection Time: 12/03/23  4:00 PM   Specimen: Synovial, Right Hip; Body Fluid  Result Value Ref Range Status   Specimen Description   Final    SYNOVIAL RIGHT HIP Performed at [facility], [address], [place], [place] [address]    Special Requests   Final    NONE Performed at [facility], [address] [place], [place] [address]    Gram Stain   Final     RARE WBC PRESENT, PREDOMINANTLY PMN NO ORGANISMS SEEN    Culture   Final    NO GROWTH 2 DAYS Performed at [facility], [address] [place], [place] [address]    Report Status PENDING  Incomplete  Aerobic/Anaerobic Culture w Gram Stain (surgical/deep wound)     Status: None (Preliminary result)   Collection Time: 12/03/23  4:00 PM   Specimen: Synovial, Right Hip; Body Fluid  Result Value Ref Range Status   Specimen Description   Final    SYNOVIAL RIGHT HIP Performed at [facility], [address] [place], [place] [address]    Special Requests   Final    NONE Performed at [facility], [address] [place], [place] [address]    Gram Stain NO WBC SEEN NO ORGANISMS SEEN   Final   Culture   Final    NO GROWTH 2 DAYS Performed at [facility], [address] [place], [place] [address]    Report Status PENDING  Incomplete  Aerobic Culture w Gram Stain (superficial specimen)     Status: None (Preliminary result)   Collection Time: 12/03/23  4:00 PM   Specimen: Synovium  Result Value Ref Range Status   Specimen Description SYNOVIAL  Final   Special Requests RT HIP SYNOVIAL FL 1  Final   Gram Stain   Final    RARE WBC PRESENT, PREDOMINANTLY PMN NO ORGANISMS SEEN    Culture   Final    NO GROWTH 2 DAYS Performed at [facility], [address] [place] [address], [place], [place] [address]    Report Status PENDING  Incomplete  Aerobic Culture w Gram Stain (superficial specimen)     Status: None (Preliminary result)   Collection Time: 12/03/23  4:00 PM   Specimen: Synovium  Result Value Ref Range Status   Specimen Description SYNOVIAL  Final   Special Requests RT HIP SUB FL 1  Final   Gram Stain   Final    RARE WBC PRESENT, PREDOMINANTLY PMN NO ORGANISMS SEEN    Culture   Final    NO GROWTH 2 DAYS Performed at [facility], [address] [place], [place] [address]    Report Status PENDING  Incomplete          Radiology Studies: DG Pelvis Portable Result Date: 12/03/2023 CLINICAL DATA:  Status post revision of right total hip replacement. EXAM: PORTABLE PELVIS 1-2 VIEWS COMPARISON:  November 30, 2023. FINDINGS: Status post bilateral total hip arthroplasties. No fracture or dislocation is noted. Expected postoperative changes seen around the right hip. IMPRESSION: Postsurgical changes as noted above. Electronically Signed   By: Lynwood Landy Raddle M.D.   On: 12/03/2023 19:37   DG HIP UNILAT WITH PELVIS 1V RIGHT Result Date: 12/03/2023 CLINICAL DATA:  Right total hip arthroplasty. EXAM: DG HIP (WITH OR WITHOUT PELVIS) 1V RIGHT; DG C-ARM  1-60 MIN-NO REPORT Radiation exposure index: 0.6844 mGy. COMPARISON:  November 30, 2023. FINDINGS: Two intraoperative fluoroscopic images were obtained of the right hip. Right acetabular and femoral components are well situated. IMPRESSION: Status post right total hip arthroplasty.  No dislocation is noted. Electronically Signed   By: Lynwood Landy Raddle M.D.   On: 12/03/2023 18:12   DG C-Arm 1-60 Min-No Report Result Date: 12/03/2023 Fluoroscopy was utilized by the requesting physician.  No radiographic interpretation.   DG C-Arm 1-60 Min-No Report Result Date: 12/03/2023 Fluoroscopy was utilized by the requesting physician.  No radiographic interpretation.         Scheduled Meds:  atorvastatin   10 mg Oral Daily   carvedilol   3.125 mg Oral BID WC   cefUROXime   500 mg Oral BID WC   Chlorhexidine  Gluconate Cloth  6 each Topical Daily   cyclobenzaprine   10 mg Oral QHS   docusate sodium   100 mg Oral Daily   doxycycline   100 mg Oral Q12H   DULoxetine   20 mg Oral QHS   feeding supplement  237 mL Oral BID BM   folic acid   1 mg Oral Daily   pantoprazole   40 mg Oral BID AC   predniSONE   5 mg Oral Q breakfast   senna  1 tablet Oral BID   Continuous Infusions:  sodium chloride  75 mL/hr at 12/04/23 0727     LOS: 8 days    Time spent: over 30 min    Meliton Monte, MD [facility]   To contact the attending provider between 7A-7P or the covering provider during after hours 7P-7A, please log into the web site [email or web address] and access using universal [place] password for that web site. If you do not have the password, please call the hospital operator.  12/05/2023, 1:25 PM

## 2023-12-05 NOTE — Progress Notes (Signed)
 Subjective: 2 Days Post-Op Procedure(s) (LRB): TOTAL HIP REVISION (Right)  Patient reports pain as appropriately controlled. Denies any new numbness/tingling.   Objective:   VITALS:  Temp:  [97.7 F (36.5 C)-98 F (36.7 C)] 98 F (36.7 C) (07/12 0754) Pulse Rate:  [86-100] 95 (07/12 0754) Resp:  [14-18] 14 (07/12 0754) BP: (130-143)/(63-73) 137/63 (07/12 0754) SpO2:  [97 %-100 %] 97 % (07/12 0754)  NAD Neurologically intact ABD soft Neurovascular intact Sensation intact distally Intact pulses distally Dorsiflexion/Plantar flexion intact Compartment soft Continue on house vac until discharge.  JP dressing - Continue drain.  She has swelling and ecchymosis at different stages of healing with dressed skin tears throughout RLE with dressings maintained, no new wounds today.  She is moving RLE on her own this morning. Leg lengths appropriate, no shortening or ER noted.   RUE splint intact, well maintained.      LABS Recent Labs    12/03/23 0726 12/04/23 0628  HGB 8.8* 8.7*  WBC 6.1 10.5  PLT 283 243   Recent Labs    12/03/23 0726 12/04/23 0628  NA 136 132*  K 3.8 4.7  CL 101 100  CO2 27 20*  BUN 6* 8  CREATININE 0.64 0.67  GLUCOSE 90 99   No results for input(s): LABPT, INR in the last 72 hours.   Assessment/Plan: 2 Days Post-Op Procedure(s) (LRB): TOTAL HIP REVISION (Right)  Advance diet Up with therapy WBAT with walker RLE, posterior hip precautions. RUE per Dr. Edna, partial weightbearing, okay to use walker.  DVT ppx: Aspirin  held due to high bleeding risk, SCDs, TEDS PO pain control PT/OT: Mobilize with PT as tolerated.  Sherri [name] 12/05/2023, 8:19 AM

## 2023-12-05 NOTE — Progress Notes (Signed)
 Inpatient Rehab Admissions:  Inpatient Rehab Consult received.  I met with patient at the bedside for rehabilitation assessment and to discuss goals and expectations of an inpatient rehab admission.  Also spoke with pt's son as he was coming to visit. Discussed average length of stay, insurance authorization requirement and discharge home after completion of CIR. Both acknowledged understanding. Pt interested in pursuing CIR and son supportive. Pt asked AC to contact daughter Suzen. Spoke with Suzen on the phone. She also acknowledged understanding of CIR goals and expectations. She is also supportive of pt pursuing CIR. She confirmed that family and hired caregivers will be able to provide 24/7 support for pt after discharge. Will continue to follow.  Signed: Tinnie Yvone Cohens, MS, CCC-SLP Admissions Coordinator [phone]

## 2023-12-05 NOTE — PMR Pre-admission (Signed)
 PMR Admission Coordinator Pre-Admission Assessment  Patient: Sherri [name] is an 83 y.o., female MRN: [id number] DOB: 02-25-1941 Height: 5' 0.98 (154.9 cm) Weight: 48.6 kg  Insurance Information HMO: yes    PPO:      PCP:      IPA:      80/20:      OTHER:  PRIMARY: U[employer] Medicare      Policy#: [id number]      Subscriber: patient CM Name:       Phone#:      Fax#: [phone] Pre-Cert#: [id number] Received approval on 12/08/23 after peer-to-peer. Anne with [facility] verified approval. Pt approved from 12/08/23-12/14/23.     Employer:  Benefits:  Phone #: [phone]     Name:  Eff. Date: 05/27/23-05/25/24     Deduct: $o (does not have deductible)      Out of Pocket Max: $3,900 ($1,143.58 met)      Life Max: NA CIR: $300/day co-pay for days 1-5, 100% coverage for days 6+      SNF: 100% coverage for days 1-20, $203 co-pay/day for days 21-100 Outpatient: $20 co-pay/visit     Co-Pay:  Home Health: 100% coverage      Co-Pay:  DME: 80% coverage     Co-Pay: 20% co-insurance Providers: in-network SECONDARY:       Policy#:      Phone#:   Artist:       Phone#:   The Data processing manager" for patients in Inpatient Rehabilitation Facilities with attached "Privacy Act Statement-Health Care Records" was provided and verbally reviewed with: Patient  Emergency Contact Information Contact Information     Name Relation Home Work [place] Daughter [phone]  [phone]      Other Contacts     Name Relation Home Work Mobile   Wentzell,Dale Spouse [phone]         Current Medical History  Patient Admitting Diagnosis: 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 Pt [facility] on 11/26/23 d/t exposed surgical hardware from previous elbow surgery 1 week prior. Pt also noted to have closed fracture of right hip; binder in place from previous repair. Orthopedics consulted. X-ray showed fracture of right elbow with malposition screw. Pt underwent I&D with hardware removal, olecranon excision and triceps advancement on 11/28/23 by Dr. Edna. Hgb 6.8 on 7/6; transfused 2 units PRBC. Noted right LE shortened on 7/7. X-ray showed right hip dislocation. Pt taken to OR for closed hip reduction by Dr. Fidel on 7/7; not successful. Pt underwent right THA revision with head ball and liner exchange by Dr. Fidel on 12/03/23. Therapy evaluations completed and CIR recommended d/t pt's deficits in functional mobility.     Patient's medical record from [facility] has been reviewed by the rehabilitation admission coordinator and physician.  Past Medical History  Past Medical History:  Diagnosis Date   Anxiety    takes Valium  daily as needed   Basal cell carcinoma 01/21/1988   left nostril (MOHS), sup-right calf (CX35FU)   Basal cell carcinoma 03/22/1996   right calf (CX35FU)   Basal cell carcinoma 03/31/1995   left wing nose   Bruises easily  d/t meds   Cancer (HCC)    basal cell ca, in situ- uterine    Cataracts, bilateral    removed bilateral   Chronic back pain    Dizziness    r/t to meds   GERD (gastroesophageal reflux disease)    no meds on a regular basis but will take Tums if needed   Headache(784.0)    r/t neck issues   History of bronchitis 6-33yrs ago   History of colon polyps    benign   HOH (hard of hearing)    wears hearing aids   Hyperlipidemia    takes Atorvastatin  on Mondays and Fridays   Hypertension    Joint pain    Joint swelling    Multiple sclerosis (HCC)    Neuromuscular disorder (HCC)    Dr. FABIENE Crete-  [facility], follows M.S.   Nocturia    PONV (postoperative nausea and vomiting)    trouble urinating after surgery in 2014   Postoperative nausea and vomiting 01/11/2019   Rheumatoid arthritis (HCC)    Dr Lorrayne weekly, RA- hands- knees- feet    Rheumatoid arthritis(714.0)    Dr Dolphus takes Xeljanz  daily   Right wrist fracture    Skin cancer    Squamous cell carcinoma of skin 11/02/2001   in situ-right knee (cx85fu)   Squamous cell carcinoma of skin 11/12/2011   in situ-left forearm (CX35FU), in situ-left foot (CX35FU)   Squamous cell carcinoma of skin 01/22/2012   in situ-left lower forearm (txpbx)   Squamous cell carcinoma of skin 11/17/2012   right shin (txpbx)   Squamous cell carcinoma of skin 10/28/2013   in situ-right shoulder (CX35FU)   Squamous cell carcinoma of skin 04/28/2014   well diff-right shin (txpbx)   Squamous cell carcinoma of skin 11/02/2014   in situ-Left shin (txpbx)   Squamous cell carcinoma of skin 12/15/2014   in situ-Left hand (txpbx), bowens-left side chest (txpbx)   Squamous cell carcinoma of skin 05/09/2015   in situ-left hand (txpbx)    Squamous cell carcinoma of skin 05/07/2016   in situ-left inner shin,ant, in situ-left inner shin, post, in situ-left outer forearm, in situ-right knuckle   Squamous cell carcinoma of skin 03/1302018   in situ-left shoulder (txpbx), in situ-right inner shin (txpbx), in situ-top of left foot (txpbx), KA- left forearm (txpbx)   Squamous cell carcinoma of skin 12/02/2016   in situ-left inner shin (txpbx)   Squamous cell carcinoma of skin 03/04/2017   in situ-left outer sup, shin (txpbx), in situ- right 2nd knuckle finger (txpbx), in situ- Left wrist (txpbx)   Squamous cell carcinoma of skin 04/06/2018   in situ-above left knee inner (txpbx)   Squamous cell carcinoma of skin 04/21/2018   in situ-right top hand (txpbx)   Squamous cell carcinoma of skin 07/08/2018   in situ-right lower inner shin  (txpbx)   Squamous cell carcinoma of skin 04/27/2019   in situ- Left neck(CX35FU), In situ- right neck (CX35FU)   Urinary retention    sees Dr.Wrenn about 2 times a yr    Has the patient had major surgery during 100 days prior to admission? Yes  Family History   family history includes Cancer in her father, maternal aunt, maternal grandmother, mother, and sister; Colon cancer in an other family member; Diabetes in her mother; Heart disease in her father and paternal aunt; Multiple sclerosis in her daughter; Pancreatic cancer in her mother; Stomach cancer in her father and sister.  Current  Medications  Current Facility-Administered Medications:    0.9 %  sodium chloride  infusion, , Intravenous, Continuous, Leigh Valery GORMAN DEVONNA, Last Rate: 75 mL/hr at 12/04/23 9272, New Bag at 12/04/23 9272   atorvastatin  (LIPITOR) tablet 10 mg, 10 mg, Oral, Daily, Swinteck, Redell, MD, 10 mg at 12/08/23 1018   bisacodyl  (DULCOLAX) suppository 10 mg, 10 mg, Rectal, Daily PRN, Fidel Redell, MD   carvedilol  (COREG ) tablet 3.125 mg, 3.125 mg, Oral, BID WC, Swinteck, Redell, MD, 3.125 mg at 12/08/23 1018   cefadroxil  (DURICEF) capsule 500 mg, 500 mg, Oral, BID, Perri DELENA Meliton Mickey., MD, 500 mg at 12/08/23 1018   Chlorhexidine  Gluconate Cloth 2 % PADS 6 each, 6 each, Topical, Daily, Swinteck, Redell, MD, 6 each at 12/08/23 1020   cyclobenzaprine  (FLEXERIL ) tablet 10 mg, 10 mg, Oral, QHS, Swinteck, Redell, MD, 10 mg at 12/07/23 2108   diphenhydrAMINE  (BENADRYL ) 12.5 MG/5ML elixir 12.5-25 mg, 12.5-25 mg, Oral, Q4H PRN, Hill, Avery S, PA-C, 25 mg at 12/04/23 2111   docusate sodium  (COLACE) capsule 100 mg, 100 mg, Oral, Daily, Swinteck, Redell, MD, 100 mg at 12/08/23 1018   DULoxetine  (CYMBALTA ) DR capsule 20 mg, 20 mg, Oral, QHS, Swinteck, Redell, MD, 20 mg at 12/07/23 2223   feeding supplement (ENSURE PLUS HIGH PROTEIN) liquid 237 mL, 237 mL, Oral, BID BM, Hill, Avery S, PA-C, 237 mL at 12/04/23 9173   folic acid   (FOLVITE ) tablet 1 mg, 1 mg, Oral, Daily, Swinteck, Redell, MD, 1 mg at 12/08/23 1018   HYDROcodone -acetaminophen  (NORCO/VICODIN) 5-325 MG per tablet 1-2 tablet, 1-2 tablet, Oral, Q4H PRN, Fidel Redell, MD, 2 tablet at 12/08/23 0315   menthol -cetylpyridinium (CEPACOL) lozenge 3 mg, 1 lozenge, Oral, PRN, 3 mg at 12/05/23 2019 **OR** phenol (CHLORASEPTIC) mouth spray 1 spray, 1 spray, Mouth/Throat, PRN, Hill, Avery S, PA-C   metoCLOPramide  (REGLAN ) tablet 5-10 mg, 5-10 mg, Oral, Q8H PRN **OR** metoCLOPramide  (REGLAN ) injection 5-10 mg, 5-10 mg, Intravenous, Q8H PRN, Hill, Avery S, PA-C   morphine  (PF) 2 MG/ML injection 2 mg, 2 mg, Intravenous, Q3H PRN, Fidel Redell, MD, 2 mg at 12/07/23 1606   ondansetron  (ZOFRAN ) tablet 4 mg, 4 mg, Oral, Q6H PRN **OR** ondansetron  (ZOFRAN ) injection 4 mg, 4 mg, Intravenous, Q6H PRN, Hill, Avery S, PA-C, 4 mg at 12/07/23 1100   Oral care mouth rinse, 15 mL, Mouth Rinse, 4 times per day, Perri DELENA Meliton Mickey., MD, 15 mL at 12/08/23 1241   Oral care mouth rinse, 15 mL, Mouth Rinse, PRN, Perri DELENA Meliton Mickey., MD   pantoprazole  (PROTONIX ) EC tablet 40 mg, 40 mg, Oral, BID AC, Swinteck, Brian, MD, 40 mg at 12/08/23 1018   polyethylene glycol (MIRALAX  / GLYCOLAX ) packet 17 g, 17 g, Oral, Daily PRN, Fidel Redell, MD, 17 g at 12/06/23 0756   polyethylene glycol (MIRALAX  / GLYCOLAX ) packet 17 g, 17 g, Oral, BID, Perri DELENA Meliton Mickey., MD, 17 g at 12/08/23 1005   predniSONE  (DELTASONE ) tablet 5 mg, 5 mg, Oral, Q breakfast, Swinteck, Redell, MD, 5 mg at 12/08/23 1018   senna (SENOKOT) tablet 8.6 mg, 1 tablet, Oral, BID, Swinteck, Redell, MD, 8.6 mg at 12/08/23 1018  Patients Current Diet:  Diet Order             Diet - low sodium heart healthy           Diet regular Room service appropriate? Yes with Assist; Fluid consistency: Thin  Diet effective now  Precautions / Restrictions Precautions Precautions: Fall, Posterior Hip Precaution  Booklet Issued: No Precaution/Restrictions Comments: R JP drain, RLE wound vac Restrictions Weight Bearing Restrictions Per Provider Order: No RUE Weight Bearing Per Provider Order: Partial weight bearing RUE Partial Weight Bearing Percentage or Pounds: okay to use walker RLE Weight Bearing Per Provider Order: Weight bearing as tolerated   Has the patient had 2 or more falls or a fall with injury in the past year? Yes  Prior Activity Level Community (5-7x/wk): drives, gets out of house ~3 days/week  Prior Functional Level Self Care: Did the patient need help bathing, dressing, using the toilet or eating? Needed some help  Indoor Mobility: Did the patient need assistance with walking from room to room (with or without device)? Unknown (pt has been NWB recently)  Stairs: Did the patient need assistance with internal or external stairs (with or without device)? Independent  Functional Cognition: Did the patient need help planning regular tasks such as shopping or remembering to take medications? Needed some help  Patient Information Are you of Hispanic, Latino/a,or Spanish origin?: A. No, not of Hispanic, Latino/a, or Spanish origin What is your race?: A. White Do you need or want an interpreter to communicate with a doctor or health care staff?: 0. No  Patient's Response To:  Health Literacy and Transportation Is the patient able to respond to health literacy and transportation needs?: Yes Health Literacy - How often do you need to have someone help you when you read instructions, pamphlets, or other written material from your doctor or pharmacy?: Never In the past 12 months, has lack of transportation kept you from medical appointments or from getting medications?: No In the past 12 months, has lack of transportation kept you from meetings, work, or from getting things needed for daily living?: No  Journalist, newspaper / Equipment Home Equipment: Occupational hygienist (4 wheels), BSC/3in1,  Shower seat - built in, [employer] - tub/shower, Wheelchair - manual, Agricultural consultant (2 wheels), Other (comment) (adjustable bed)  Prior Device Use: Indicate devices/aids used by the patient prior to current illness, exacerbation or injury? Manual wheelchair  Current Functional Level Cognition  Orientation Level: Oriented X4    Extremity Assessment (includes Sensation/Coordination)  Upper Extremity Assessment: RUE deficits/detail RUE Deficits / Details: RUE with splint from wrist to elbow, can oppose digits and make a fist within limits of splint, reports mild sensation changes RUE Sensation: decreased light touch, decreased proprioception RUE Coordination: decreased fine motor, decreased gross motor  Lower Extremity Assessment: Defer to PT evaluation RLE Deficits / Details: Pt demonstrated WFL ankle ROM ad limited hip/knee ROM. Grossly 3-/5 strength. RLE: Unable to fully assess due to pain RLE Sensation: WNL RLE Coordination: decreased gross motor    ADLs  Overall ADL's : Needs assistance/impaired Eating/Feeding: Sitting, Set up Grooming: Sitting, Brushing hair, Oral care, Wash/dry face Upper Body Bathing: Moderate assistance, Sitting Lower Body Bathing: Maximal assistance, Sitting/lateral leans, Sit to/from stand Upper Body Dressing : Moderate assistance, Sitting Lower Body Dressing: Maximal assistance, Sitting/lateral leans, Sit to/from stand Toilet Transfer: Maximal assistance, +2 for physical assistance Toileting- Clothing Manipulation and Hygiene: Maximal assistance Functional mobility during ADLs: Maximal assistance, +2 for physical assistance    Mobility  Overal bed mobility: Needs Assistance Bed Mobility: Supine to Sit Supine to sit: Mod assist General bed mobility comments: pt up in chair on arrival and at end of session    Transfers  Overall transfer level: Needs assistance Equipment used: Rolling walker (2 wheels) Transfers: Sit  to/from Stand, Bed to  chair/wheelchair/BSC Sit to Stand: Min assist Bed to/from chair/wheelchair/BSC transfer type:: Squat pivot Stand pivot transfers: From elevated surface, Mod assist, +2 physical assistance Squat pivot transfers: Max assist, +2 physical assistance General transfer comment: Pt stood from recliner. Cues for proper hand placement using RW.    Ambulation / Gait / Stairs / Wheelchair Mobility  Ambulation/Gait Ambulation/Gait assistance: Mod assist, +2 physical assistance, +2 safety/equipment Gait Distance (Feet): 3 Feet (x2) Assistive device: Rolling walker (2 wheels) Gait Pattern/deviations: Step-to pattern, Decreased stride length, Decreased dorsiflexion - right, Decreased weight shift to right, Knees buckling, Antalgic General Gait Details: pt taking short steps with RW needing cues for sequencing each step, pt fearful of falling with knees buckling x1 with mod A to maintain balance, chair follow for safety    Posture / Balance Dynamic Sitting Balance Sitting balance - Comments: Pt sat EOB with supervision-CGA. Balance Overall balance assessment: Needs assistance, History of Falls Sitting-balance support: Feet supported, No upper extremity supported Sitting balance-Leahy Scale: Fair Sitting balance - Comments: Pt sat EOB with supervision-CGA. Standing balance support: Bilateral upper extremity supported, During functional activity, Reliant on assistive device for balance Standing balance-Leahy Scale: Poor Standing balance comment: reliant on external support    Special needs/care consideration Continuous Drip IV  0.9% sodium chloride  infusion, Skin Abrasion: arm, leg, thigh/bilateral; Blister: buttocks/upper; Ecchymosis: arm,leg/bilateral; Erythema/Redness: arm, buttocks/bilateral; Scratch marks; back, buttocks/right; Weeping: elbow/right; Wound- elbow/anterior, right, upper; Surgical Incision: hip/right; Pressure injury: coccyx/mid; Skin Tear: arm/anterior, left, lower, External Urinary  Catheter and JP drain/right, anterior   Previous Home Environment (from acute therapy documentation) Living Arrangements: Spouse/significant other  Lives With: Spouse Available Help at Discharge: Family, Available PRN/intermittently, Available 24 hours/day, Personal care attendant (Husband couldn't physically assist pt but could help with houshold management; Son leaves nearby; Daughter reports 24/7 aides) Type of Home: House Home Layout: Able to live on main level with bedroom/bathroom Home Access: Ramped entrance Bathroom Shower/Tub: Walk-in shower, Nurse, adult Toilet: Handicapped height Bathroom Accessibility: Yes How Accessible: Accessible via walker Home Care Services: Yes Type of Home Care Services: Home PT, Home RN (OT was supposed to begin services, but pt was admitted to hospital. Also has 24/7 caregivers.) Home Care Agency (if known): [facility] Additional Comments: Per dtr, 24/7 caregivers hired or family.  Dtr is retired Human resources officer for Discharge Living Setting: Patient's home Type of Home at Discharge: House Discharge Home Layout: Able to live on main level with bedroom/bathroom Discharge Home Access: Ramped entrance Discharge Bathroom Shower/Tub: Walk-in shower Discharge Bathroom Toilet: Handicapped height Discharge Bathroom Accessibility: Yes How Accessible: Accessible via walker Does the patient have any problems obtaining your medications?: No  Social/Family/Support Systems Anticipated Caregiver: Cheryl (husband), family, hired caregivers Anticipated Caregiver's Contact Information: Cheryl: [phone], Suzen (daughter): [phone] Ability/Limitations of Caregiver: Husband can provide superision only Caregiver Availability: 24/7 Discharge Plan Discussed with Primary Caregiver: Yes Is Caregiver In Agreement with Plan?: Yes Does Caregiver/Family have Issues with Lodging/Transportation while Pt is in Rehab?:  No  Goals Patient/Family Goal for Rehab: Supervision-Min A:PT/OT, Supervision: ST Expected length of stay: 12-14 days Pt/Family Agrees to Admission and willing to participate: Yes Program Orientation Provided & Reviewed with Pt/Caregiver Including Roles  & Responsibilities: Yes  Decrease burden of Care through IP rehab admission: NA  Possible need for SNF placement upon discharge: Not anticipated  Patient Condition: This patient's condition remains as documented in the consult dated 12/06/23, in which the Rehabilitation Physician determined and documented that  the patient's condition is appropriate for intensive rehabilitative care in an inpatient rehabilitation facility. Will admit to inpatient rehab today.  Preadmission Screen Completed By:  Tinnie SHAUNNA Yvone Delayne, 12/08/2023 1:42 PM ______________________________________________________________________   Discussed status with Dr. Cornelio on 12/08/23  at 1:42 PM and received approval for admission today.  Admission Coordinator:  Tinnie SHAUNNA Yvone Delayne, CCC-SLP, time 1:42 PM/Date 12/08/23   Diagnosis:  L total hip revision due to multiple dislocations- WBAT with posterio hip precautions and partial WB on RUE_ but can use platform walker Does the need for close, 24 hr/day medical supervision in concert with the patient's rehab needs make it unreasonable for this patient to be served in a less intensive setting? Yes Co-Morbidities requiring supervision/potential complications: RA, MS;  HOH, significant anxiety; frequent falls- R elbow/olecranon fx s/p ORIF and hardware removal due to it showing- dysphagia- refusing nectar thick liquids; on PO ABX for 4-6 weeks due to Elbow wound; has WOUND VAC- on R hip;  Due to bladder management, bowel management, safety, skin/wound care, disease management, medication administration, pain management, and patient education, does the patient require 24 hr/day rehab nursing? Yes Does the patient require  coordinated care of a physician, rehab nurse, therapy disciplines of PT, OT and SLP for swallowing to address physical and functional deficits in the context of the above medical diagnosis(es)? Yes Addressing deficits in the following areas: balance, endurance, locomotion, strength, transferring, bowel/bladder control, bathing, dressing, feeding, grooming, toileting, and swallowing Can the patient actively participate in an intensive therapy program of at least 3 hrs of therapy per day at least 5 days per week? Yes The potential for patient to make measurable gains while on inpatient rehab is fair Anticipated functional outcomes upon discharge from inpatient rehab are supervision and min assist  with PT, supervision and min assist with OT, supervision with SLP. Estimated rehab length of stay to reach the above functional goals is: ~ 2 weeks Anticipated discharge destination: Home Overall Rehab/Functional Prognosis: good and fair   MD Signature:

## 2023-12-06 DIAGNOSIS — T8130XA Disruption of wound, unspecified, initial encounter: Secondary | ICD-10-CM | POA: Diagnosis not present

## 2023-12-06 LAB — URINALYSIS, ROUTINE W REFLEX MICROSCOPIC
Bilirubin Urine: NEGATIVE
Glucose, UA: NEGATIVE mg/dL
Ketones, ur: NEGATIVE mg/dL
Nitrite: NEGATIVE
Protein, ur: NEGATIVE mg/dL
Specific Gravity, Urine: 1.006 (ref 1.005–1.030)
pH: 6 (ref 5.0–8.0)

## 2023-12-06 MED ORDER — POLYETHYLENE GLYCOL 3350 17 G PO PACK
17.0000 g | PACK | Freq: Two times a day (BID) | ORAL | Status: DC
  Administered 2023-12-06 – 2023-12-08 (×3): 17 g via ORAL
  Filled 2023-12-06 (×4): qty 1

## 2023-12-06 MED ORDER — ORAL CARE MOUTH RINSE
15.0000 mL | OROMUCOSAL | Status: DC | PRN
Start: 2023-12-06 — End: 2023-12-08

## 2023-12-06 MED ORDER — ORAL CARE MOUTH RINSE
15.0000 mL | OROMUCOSAL | Status: DC
  Administered 2023-12-06 – 2023-12-08 (×7): 15 mL via OROMUCOSAL

## 2023-12-06 NOTE — Progress Notes (Signed)
 PROGRESS NOTE    Sherri  Jacobi Ryant [name]  FMW:[id number] DOB: 1941-01-15 DOA: 11/26/2023 PCP: [name] PARAS, MD  No chief complaint on file.   Brief Narrative:   Sherri  Twala Collings [name] is an 83 y.o. 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   Assessment & Plan:   Principal Problem:   Wound dehiscence Active Problems:   Hyponatremia   Hyperlipidemia   Multiple sclerosis (HCC)   Essential hypertension   Rheumatoid arthritis (HCC)   Macrocytic anemia   Cellulitis of right elbow  Plain appears to be for inpatient rehab  Urinary Urgency UA with large LE, WBC's Will monitor - consider culture if additional symptoms  Right elbow visible hardware with Wound dehiscence with cellulitis: Orthopedic surgery to recommend further antibiotics. Status post I&D of the right elbow on 11/27/2023 and hardware removal. Blood cx NGTD x5  Based on my discussion with Dr. Edna, he's ok with 2 weeks abx and narrowing to basic skin flora coverage (his note from 7/6 mentioned keflex  - will use duricef for ease of dosing). Orthopedic surgery recommended aspirin  for 4 weeks. Follow-up with orthopedic surgery in 2 weeks. PT eval is pending.   Superior Dislocation of R hip Arthroplasty S/p closed reduction on 7/7 S/p revision of R THA, head ball and liner exchange Pending intra-op cultures  Dysphagia SLP recommending nectar thick liquids, she's not willing to do this  Hypovolemic hyponatremia: trend   Hypokalemia: improved   Macrocytic anemia/acute blood loss anemia Normal B12, folate Iron studies suggestive of AOCD   Rheumatoid arthritis: Continue  methotrexate  and prednisone .   History of multiple sclerosis: She is no longer on these medication.   Chronic pain: Continue Cymbalta .   Urinary retention: Continue oxybutynin .   History of recent right hip surgery in May 2025: Family planning for home   Diffuse Ecchymoses Bruises all over Platelets ok, will monitor Continue to monitor, may need follow up with derm and/or hematology  Goals of care: Seen by palliative 7/5, they've signed off    DVT prophylaxis: SCD Code Status: full Family Communication: none at bedside Disposition:   Status is: Inpatient Remains inpatient appropriate because: need for ongoing care   Consultants:  orthopedics  Procedures:  7/7 Closed reduction right hip.   7/5  Irrigation debridement of right elbow with hardware removal olecranon excision and triceps advancement   Antimicrobials:  Anti-infectives (From admission, onward)    Start     Dose/Rate Route Frequency Ordered Stop   12/05/23 1430  cefadroxil  (DURICEF) capsule 500 mg        500 mg Oral 2 times daily 12/05/23 1337     12/04/23 0800  vancomycin  (VANCOCIN ) IVPB 1000 mg/200 mL premix        1,000 mg 200 mL/hr over 60 Minutes Intravenous Every 12 hours 12/04/23 0508 12/04/23 0925   12/03/23 1639  vancomycin  (VANCOCIN ) powder  Status:  Discontinued          As needed 12/03/23 1639 12/03/23 1810   12/03/23 1300  ceFAZolin  (ANCEF ) IVPB 2g/100 mL premix        2 g 200 mL/hr over 30 Minutes Intravenous On call to O.R. 12/03/23 1241 12/03/23 1512   12/03/23 1300  vancomycin  (VANCOCIN ) IVPB  1000 mg/200 mL premix        1,000 mg 200 mL/hr over 60 Minutes Intravenous On call to O.R. 12/03/23 1241 12/03/23 1403   12/03/23 1257  ceFAZolin  (ANCEF ) 2-4 GM/100ML-% IVPB       Note to Pharmacy: Sonny Mays B: cabinet override      12/03/23 1257 12/03/23 1524   11/30/23 1700  cefUROXime  (CEFTIN ) tablet 500 mg  Status:  Discontinued        500 mg Oral 2 times daily with meals  11/30/23 1021 12/05/23 1336   11/30/23 1100  doxycycline  (VIBRA -TABS) tablet 100 mg  Status:  Discontinued        100 mg Oral Every 12 hours 11/30/23 1005 11/30/23 1021   11/30/23 1100  doxycycline  (VIBRA -TABS) tablet 100 mg  Status:  Discontinued        100 mg Oral Every 12 hours 11/30/23 1021 12/05/23 1336   11/28/23 2200  ceFAZolin  (ANCEF ) IVPB 2g/100 mL premix        2 g 200 mL/hr over 30 Minutes Intravenous Every 8 hours 11/28/23 1714 11/29/23 0558   11/28/23 1453  vancomycin  (VANCOCIN ) powder  Status:  Discontinued          As needed 11/28/23 1453 11/28/23 1553   11/28/23 1315  ceFAZolin  (ANCEF ) IVPB 2g/100 mL premix        2 g 200 mL/hr over 30 Minutes Intravenous  Once 11/28/23 1303 11/28/23 1421   11/28/23 1315  vancomycin  (VANCOCIN ) IVPB 1000 mg/200 mL premix  Status:  Discontinued        1,000 mg 200 mL/hr over 60 Minutes Intravenous  Once 11/28/23 1303 11/28/23 1645   11/28/23 0000  cefTRIAXone  (ROCEPHIN ) 1 g in sodium chloride  0.9 % 100 mL IVPB  Status:  Discontinued        1 g 200 mL/hr over 30 Minutes Intravenous Every 24 hours 11/27/23 0702 11/27/23 0856   11/27/23 1100  ceFAZolin  (ANCEF ) IVPB 2g/100 mL premix  Status:  Discontinued        2 g 200 mL/hr over 30 Minutes Intravenous On call to O.R. 11/27/23 0216 11/28/23 0559   11/27/23 1100  vancomycin  (VANCOCIN ) IVPB 1000 mg/200 mL premix  Status:  Discontinued        1,000 mg 200 mL/hr over 60 Minutes Intravenous On call to O.R. 11/27/23 0216 11/28/23 0559   11/27/23 0900  cefTRIAXone  (ROCEPHIN ) 1 g in sodium chloride  0.9 % 100 mL IVPB  Status:  Discontinued        1 g 200 mL/hr over 30 Minutes Intravenous Every 24 hours 11/27/23 0856 11/30/23 1050   11/27/23 0600  cephALEXin  (KEFLEX ) capsule 500 mg  Status:  Discontinued        500 mg Oral 3 times daily 11/27/23 0519 11/27/23 0702   11/27/23 0430  ceFAZolin  (ANCEF ) IVPB 2g/100 mL premix  Status:  Discontinued        2 g 200 mL/hr over 30 Minutes Intravenous  Once  11/27/23 0422 11/27/23 0517       Subjective: No complaints  Objective: Vitals:   12/05/23 1636 12/05/23 1944 12/06/23 0443 12/06/23 0729  BP: 116/80 (!) 137/58 114/66 (!) 150/73  Pulse: 95 89 96 (!) 103  Resp: 14 16 16 16   Temp: 97.7 F (36.5 C) 97.8 F (36.6 C) 97.6 F (36.4 C) 98 F (36.7 C)  TempSrc: Oral  Oral Oral  SpO2: 98% 95% 96% 96%  Weight:      Height:  Intake/Output Summary (Last 24 hours) at 12/06/2023 1215 Last data filed at 12/06/2023 0900 Gross per 24 hour  Intake 120 ml  Output 1015 ml  Net -895 ml   Filed Weights   11/26/23 2012 11/28/23 1238 12/03/23 1244  Weight: 48.6 kg 48.6 kg 48.6 kg    Examination:  General: No acute distress. Cardiovascular: RRR Lungs: unlabored Neurological: Alert and oriented 3. Moves all extremities 4 with equal strength. Cranial nerves II through XII grossly intact. Extremities: No clubbing or cyanosis. No edema.  Dressing to RUE.  Data Reviewed: I have personally reviewed following labs and imaging studies  CBC: Recent Labs  Lab 11/29/23 2048 11/30/23 1005 12/03/23 0726 12/04/23 0628  WBC  --  9.1 6.1 10.5  NEUTROABS  --  7.8* 4.1 8.4*  HGB 10.0* 10.8* 8.8* 8.7*  HCT 31.2* 33.2* 27.2* 27.9*  MCV  --  99.4 99.6 104.9*  PLT  --  309 283 243    Basic Metabolic Panel: Recent Labs  Lab 11/30/23 0508 12/03/23 0726 12/04/23 0628  NA 135 136 132*  K 3.7 3.8 4.7  CL 101 101 100  CO2 25 27 20*  GLUCOSE 69* 90 99  BUN <5* 6* 8  CREATININE 0.62 0.64 0.67  CALCIUM  8.0* 8.0* 7.7*  MG  --  1.8 1.7  PHOS  --  2.9 3.7    GFR: Estimated Creatinine Clearance: 41.6 mL/min (by C-G formula based on SCr of 0.67 mg/dL).  Liver Function Tests: Recent Labs  Lab 12/03/23 0726 12/04/23 0628  AST 19 26  ALT 9 6  ALKPHOS 57 52  BILITOT 0.8 1.3*  PROT 4.9* 4.8*  ALBUMIN  2.1* 2.2*    CBG: No results for input(s): GLUCAP in the last 168 hours.   Recent Results (from the past 240 hours)   Culture, blood (Routine X 2) w Reflex to ID Panel     Status: None   Collection Time: 11/27/23  8:26 AM   Specimen: BLOOD RIGHT HAND  Result Value Ref Range Status   Specimen Description BLOOD RIGHT HAND  Final   Special Requests   Final    BOTTLES DRAWN AEROBIC ONLY Blood Culture results may not be optimal due to an inadequate volume of blood received in culture bottles   Culture   Final    NO GROWTH 5 DAYS Performed at [facility], [address] [place], [place] [address]    Report Status 12/02/2023 FINAL  Final  Culture, blood (Routine X 2) w Reflex to ID Panel     Status: None   Collection Time: 11/27/23  8:27 AM   Specimen: BLOOD LEFT ARM  Result Value Ref Range Status   Specimen Description BLOOD LEFT ARM  Final   Special Requests   Final    BOTTLES DRAWN AEROBIC AND ANAEROBIC Blood Culture adequate volume   Culture   Final    NO GROWTH 5 DAYS Performed at [facility], [address], [place], [place] [address]    Report Status 12/02/2023 FINAL  Final  Surgical PCR screen     Status: None   Collection Time: 12/03/23 10:24 AM   Specimen: Nasal Mucosa; Nasal Swab  Result Value Ref Range Status   MRSA, PCR NEGATIVE NEGATIVE Final   Staphylococcus aureus NEGATIVE NEGATIVE Final    Comment: (NOTE) The Xpert SA Assay (FDA approved for NASAL specimens in patients 67 years of age and older), is one component of [facility] comprehensive surveillance program. It is  not intended to diagnose infection nor to guide or monitor treatment. Performed at [facility], [address] [place], [place] [address]   Aerobic/Anaerobic Culture w Gram Stain (surgical/deep wound)     Status: None (Preliminary result)   Collection Time: 12/03/23  4:00 PM   Specimen: Synovial, Right Hip; Body Fluid  Result Value Ref Range Status   Specimen Description   Final    SYNOVIAL RIGHT HIP Performed at [facility], [address], [place], [place] [address]     Special Requests   Final    NONE Performed at [facility], [address], [place], [place] [address]    Gram Stain   Final    RARE WBC PRESENT, PREDOMINANTLY PMN NO ORGANISMS SEEN    Culture   Final    NO GROWTH 3 DAYS NO ANAEROBES ISOLATED; CULTURE IN PROGRESS FOR 5 DAYS Performed at [facility], [address], [place], [place] [address]    Report Status PENDING  Incomplete  Aerobic/Anaerobic Culture w Gram Stain (surgical/deep wound)     Status: None (Preliminary result)   Collection Time: 12/03/23  4:00 PM   Specimen: Synovial, Right Hip; Body Fluid  Result Value Ref Range Status   Specimen Description   Final    SYNOVIAL RIGHT HIP Performed at [facility], [address] [place], [place] [address]    Special Requests   Final    NONE Performed at [facility], [address] [place], [place] [address]    Gram Stain NO WBC SEEN NO ORGANISMS SEEN   Final   Culture   Final    NO GROWTH 3 DAYS NO ANAEROBES ISOLATED; CULTURE IN PROGRESS FOR 5 DAYS Performed at [facility], [address], [place], [place] [address]    Report Status PENDING  Incomplete  Aerobic Culture w Gram Stain (superficial specimen)     Status: None (Preliminary result)   Collection Time: 12/03/23  4:00 PM   Specimen: Synovium  Result Value Ref Range Status   Specimen Description SYNOVIAL  Final   Special Requests RT HIP SYNOVIAL FL 1  Final   Gram Stain   Final    RARE WBC PRESENT, PREDOMINANTLY PMN NO ORGANISMS SEEN    Culture   Final    NO GROWTH 3 DAYS Performed at [facility], [address] [place], [place] [address]    Report Status PENDING  Incomplete  Aerobic Culture w Gram Stain (superficial specimen)     Status: None (Preliminary result)   Collection Time: 12/03/23  4:00 PM   Specimen: Synovium  Result Value Ref Range Status   Specimen Description SYNOVIAL  Final   Special Requests RT HIP SUB FL 1   Final   Gram Stain   Final    RARE WBC PRESENT, PREDOMINANTLY PMN NO ORGANISMS SEEN    Culture   Final    NO GROWTH 3 DAYS Performed at [facility], [address], [place], [place] [address]    Report Status PENDING  Incomplete         Radiology Studies: No results found.        Scheduled Meds:  atorvastatin   10 mg Oral Daily   carvedilol   3.125 mg Oral BID WC   cefadroxil   500 mg Oral BID   Chlorhexidine  Gluconate Cloth  6 each Topical Daily   cyclobenzaprine   10 mg Oral QHS  docusate sodium   100 mg Oral Daily   DULoxetine   20 mg Oral QHS   feeding supplement  237 mL Oral BID BM   folic acid   1 mg Oral Daily   mouth rinse  15 mL Mouth Rinse 4 times per day   pantoprazole   40 mg Oral BID AC   predniSONE   5 mg Oral Q breakfast   senna  1 tablet Oral BID   Continuous Infusions:  sodium chloride  75 mL/hr at 12/04/23 0727     LOS: 9 days    Time spent: over 30 min    Meliton Monte, MD [facility]   To contact the attending provider between 7A-7P or the covering provider during after hours 7P-7A, please log into the web site [email or web address] and access using universal [place] password for that web site. If you do not have the password, please call the hospital operator.  12/06/2023, 12:15 PM

## 2023-12-06 NOTE — Progress Notes (Signed)
 Subjective: 3 Days Post-Op Procedure(s) (LRB): TOTAL HIP REVISION (Right) Patient reports pain as mild.   Patient seen in rounds for Dr. Fidel. Patient is resting in bed on exam this morning. No acute events overnight. She reports pain in her groin.  We will continue therapy today.   Objective: Vital signs in last 24 hours: Temp:  [97.6 F (36.4 C)-98 F (36.7 C)] 98 F (36.7 C) (07/13 0729) Pulse Rate:  [89-103] 103 (07/13 0729) Resp:  [14-16] 16 (07/13 0729) BP: (114-150)/(58-80) 150/73 (07/13 0729) SpO2:  [95 %-98 %] 96 % (07/13 0729)  Intake/Output from previous day:  Intake/Output Summary (Last 24 hours) at 12/06/2023 0917 Last data filed at 12/06/2023 0900 Gross per 24 hour  Intake 240 ml  Output 1015 ml  Net -775 ml     Intake/Output this shift: Total I/O In: -  Out: 20 [Drains:20]  Labs: Recent Labs    12/04/23 0628  HGB 8.7*   Recent Labs    12/04/23 0628  WBC 10.5  RBC 2.66*  HCT 27.9*  PLT 243   Recent Labs    12/04/23 0628  NA 132*  K 4.7  CL 100  CO2 20*  BUN 8  CREATININE 0.67  GLUCOSE 99  CALCIUM  7.7*   No results for input(s): LABPT, INR in the last 72 hours.  Exam: General - Patient is Alert and Oriented Extremity - Neurologically intact Sensation intact distally Intact pulses distally Dorsiflexion/Plantar flexion intact Dressing - dressing C/D/I, incisional vac in place with good suction, JP drain in place with moderate output Motor Function - intact, moving foot and toes well on exam. I straightened her leg and log rolled it without discomfort.   Past Medical History:  Diagnosis Date   Anxiety    takes Valium  daily as needed   Basal cell carcinoma 01/21/1988   left nostril (MOHS), sup-right calf (CX35FU)   Basal cell carcinoma 03/22/1996   right calf (CX35FU)   Basal cell carcinoma 03/31/1995   left wing nose   Bruises easily    d/t meds   Cancer (HCC)    basal cell ca, in situ- uterine    Cataracts,  bilateral    removed bilateral   Chronic back pain    Dizziness    r/t to meds   GERD (gastroesophageal reflux disease)    no meds on a regular basis but will take Tums if needed   Headache(784.0)    r/t neck issues   History of bronchitis 6-35yrs ago   History of colon polyps    benign   HOH (hard of hearing)    wears hearing aids   Hyperlipidemia    takes Atorvastatin  on Mondays and Fridays   Hypertension    Joint pain    Joint swelling    Multiple sclerosis (HCC)    Neuromuscular disorder (HCC)    Dr. FABIENE Crete- [facility], follows M.S.   Nocturia    PONV (postoperative nausea and vomiting)    trouble urinating after surgery in 2014   Postoperative nausea and vomiting 01/11/2019   Rheumatoid arthritis (HCC)    Dr Lorrayne weekly, RA- hands- knees- feet    Rheumatoid arthritis(714.0)    Dr Dolphus takes Xeljanz  daily   Right wrist fracture    Skin cancer    Squamous cell carcinoma of skin 11/02/2001   in situ-right knee ([id number])   Squamous cell carcinoma of skin 11/12/2011   in situ-left forearm (CX35FU), in situ-left foot (CX35FU)  Squamous cell carcinoma of skin 01/22/2012   in situ-left lower forearm (txpbx)   Squamous cell carcinoma of skin 11/17/2012   right shin (txpbx)   Squamous cell carcinoma of skin 10/28/2013   in situ-right shoulder (CX35FU)   Squamous cell carcinoma of skin 04/28/2014   well diff-right shin (txpbx)   Squamous cell carcinoma of skin 11/02/2014   in situ-Left shin (txpbx)   Squamous cell carcinoma of skin 12/15/2014   in situ-Left hand (txpbx), bowens-left side chest (txpbx)   Squamous cell carcinoma of skin 05/09/2015   in situ-left hand (txpbx)    Squamous cell carcinoma of skin 05/07/2016   in situ-left inner shin,ant, in situ-left inner shin, post, in situ-left outer forearm, in situ-right knuckle   Squamous cell carcinoma of skin 03/1302018   in situ-left shoulder (txpbx), in situ-right inner shin (txpbx), in  situ-top of left foot (txpbx), KA- left forearm (txpbx)   Squamous cell carcinoma of skin 12/02/2016   in situ-left inner shin (txpbx)   Squamous cell carcinoma of skin 03/04/2017   in situ-left outer sup, shin (txpbx), in situ- right 2nd knuckle finger (txpbx), in situ- Left wrist (txpbx)   Squamous cell carcinoma of skin 04/06/2018   in situ-above left knee inner (txpbx)   Squamous cell carcinoma of skin 04/21/2018   in situ-right top hand (txpbx)   Squamous cell carcinoma of skin 07/08/2018   in situ-right lower inner shin (txpbx)   Squamous cell carcinoma of skin 04/27/2019   in situ- Left neck(CX35FU), In situ- right neck (CX35FU)   Urinary retention    sees Dr.Wrenn about 2 times a yr    Assessment/Plan: 3 Days Post-Op Procedure(s) (LRB): TOTAL HIP REVISION (Right) Principal Problem:   Wound dehiscence Active Problems:   Hyperlipidemia   Multiple sclerosis (HCC)   Essential hypertension   Rheumatoid arthritis (HCC)   Hyponatremia   Macrocytic anemia   Cellulitis of right elbow  Estimated body mass index is 20.26 kg/m as calculated from the following:   Height as of this encounter: 5' 0.98 (1.549 m).   Weight as of this encounter: 48.6 kg.  ABLA. Hemoglobin 8.7. Continue to monitor. Aspirin  held due to high bleeding risk.    Intraoperative cultures, grain stains negative, no growth so far. Continue to monitor.    WBAT with walker RLE, posterior hip precautions. RUE per Dr. Edna, partial weightbearing, okay to use walker.  DVT ppx: Aspirin  held due to high bleeding risk, SCDs, TEDS PO pain control PT/OT: Mobilize with PT as tolerated.  Dispo: - Patient under care of the medical team, disposition per their recommendation.  - Mobilize out of bed with PT.  - Follow IntraOp cultures.  - We will discontinue the JP drain when output is less than 10 cc per shift, okay to go home with if high output.  - Continue negative pressure dressing at 75 mmHg. Upon  discharge, the house VAC suction unit will need to be exchanged for a portable Prevena suction unit.  - She will need to follow-up in the office within 7 days of discharge for removal of her negative pressure dressing.     Rosina Calin, PA-C Orthopedic Surgery [phone] 12/06/2023, 9:17 AM

## 2023-12-06 NOTE — Plan of Care (Signed)
   Problem: Education: Goal: Knowledge of General Education information will improve Description: Including pain rating scale, medication(s)/side effects and non-pharmacologic comfort measures Outcome: Progressing   Problem: Health Behavior/Discharge Planning: Goal: Ability to manage health-related needs will improve Outcome: Progressing   Problem: Clinical Measurements: Goal: Ability to maintain clinical measurements within normal limits will improve Outcome: Progressing Goal: Will remain free from infection Outcome: Progressing Goal: Diagnostic test results will improve Outcome: Progressing Goal: Respiratory complications will improve Outcome: Progressing Goal: Cardiovascular complication will be avoided Outcome: Progressing   Problem: Activity: Goal: Risk for activity intolerance will decrease Outcome: Progressing   Problem: Nutrition: Goal: Adequate nutrition will be maintained Outcome: Progressing   Problem: Coping: Goal: Level of anxiety will decrease Outcome: Progressing   Problem: Elimination: Goal: Will not experience complications related to bowel motility Outcome: Progressing Goal: Will not experience complications related to urinary retention Outcome: Progressing   Problem: Pain Managment: Goal: General experience of comfort will improve and/or be controlled Outcome: Progressing   Problem: Safety: Goal: Ability to remain free from injury will improve Outcome: Progressing   Problem: Skin Integrity: Goal: Risk for impaired skin integrity will decrease Outcome: Progressing   Problem: Education: Goal: Knowledge of the prescribed therapeutic regimen will improve Outcome: Progressing Goal: Understanding of discharge needs will improve Outcome: Progressing Goal: Individualized Educational Video(s) Outcome: Progressing   Problem: Activity: Goal: Ability to avoid complications of mobility impairment will improve Outcome: Progressing Goal: Ability to  tolerate increased activity will improve Outcome: Progressing   Problem: Clinical Measurements: Goal: Postoperative complications will be avoided or minimized Outcome: Progressing   Problem: Pain Management: Goal: Pain level will decrease with appropriate interventions Outcome: Progressing   Problem: Skin Integrity: Goal: Will show signs of wound healing Outcome: Progressing

## 2023-12-06 NOTE — Consult Note (Signed)
 Physical Medicine and Rehabilitation Consult Reason for Consult:possible CIR Referring Physician: Dr Meliton Monte   HPI: Sherri [name] is a 83 y.o. R handed female with hx of MS, RA on Prednisone  and methotrexate  usually, frequent falls, with multiple falls this year; anxiety, HOH,  admitted 7/3- after dislocated R hip for 2nd time- s/p R hip THR revision on 7/10 after couldn't reduce it in OR on 7/7  . Of note, pt has recent admission  for femoral neck fx of R side 6/17- s/p THR on 6/19- 6/22 dislocated for 1st time and reduced 6/23.  During May, she also fx'd R olecranon, however ended up needing ORIF 6/27- and then when readmitted, was found to have bone of R elbow and hardware showing, so went back for hardware removal. She's on Doxy and Cefuroxime  for 4-6 weeks due to open bone/hardware showing.   Currently, pt has drain still in after surgery and a wound VAC placed to R hip.   Pt's methotrexate  has been held, but is still on Prednisone  5 mg daily.   Per chart, pt's LBM was 7/7- went back through chart- cannot see BM recorded.    Pt was also evaluated by SLP due to swallowing/choking episodes reported by family- SLP felt she would do best with nectar thick liquids due to dysphagia seen on swallowing testing. However, pt has refused this and continues to have unlimited thin liquids at bedside.  Also per therapy, seen to have poor safety awareness and  poor insight into deficits, of note.     She's received less pain meds today- last IV morphine  yesterday but still taking some Norco.  So far Norco has been taken 10 mg 2x today Yesterday took IV morphine  a few times and Norco about every 4-5 hours.  Per therapy notes, pt is mod  A of 2 squat pivot transfers; mod-max A for ADLs- and max A of 2 for toileting.   Social Hx: pt lives at home with husband who needs care- and has aides it appears- was brought to room in transport w/c today- per daughter who's retired Charity fundraiser, pt  has had 24/7 aides at home since came home from [facility]- where she refused to stay due to not happy with facility.      Review of Systems  Constitutional:  Positive for malaise/fatigue and weight loss.  HENT:  Positive for hearing loss.   Eyes: Negative.   Respiratory:  Positive for cough and shortness of breath.        Says she chokes with drinking- she's supposed to be on nectar thick liquids- pt refusing- also wasn't up at 90 degrees when attempted to drink when I was in room- changed this- in addition, per RN daughter, was having anxiety when I entered room.  Cardiovascular: Negative.   Gastrointestinal:  Positive for constipation. Negative for nausea.  Genitourinary:  Positive for frequency and urgency.       Cannot easily get to [facility], so using Purewick- which just leaked- so she is wet  Musculoskeletal:  Positive for falls, joint pain and myalgias.       Frequent falls  Skin:        A lot of ecchymoses  Neurological:  Positive for sensory change and focal weakness.       RLE  Endo/Heme/Allergies:  Bruises/bleeds easily.  Psychiatric/Behavioral:  The patient is nervous/anxious.   All other systems reviewed and are negative.  Past Medical History:  Diagnosis Date  Anxiety    takes Valium  daily as needed   Basal cell carcinoma 01/21/1988   left nostril (MOHS), sup-right calf (CX35FU)   Basal cell carcinoma 03/22/1996   right calf (CX35FU)   Basal cell carcinoma 03/31/1995   left wing nose   Bruises easily    d/t meds   Cancer (HCC)    basal cell ca, in situ- uterine    Cataracts, bilateral    removed bilateral   Chronic back pain    Dizziness    r/t to meds   GERD (gastroesophageal reflux disease)    no meds on a regular basis but will take Tums if needed   Headache(784.0)    r/t neck issues   History of bronchitis 6-65yrs ago   History of colon polyps    benign   HOH (hard of hearing)    wears hearing aids   Hyperlipidemia    takes Atorvastatin  on  Mondays and Fridays   Hypertension    Joint pain    Joint swelling    Multiple sclerosis (HCC)    Neuromuscular disorder (HCC)    Dr. FABIENE Crete- [facility], follows [facility]   Nocturia    PONV (postoperative nausea and vomiting)    trouble urinating after surgery in 2014   Postoperative nausea and vomiting 01/11/2019   Rheumatoid arthritis (HCC)    Dr Lorrayne weekly, RA- hands- knees- feet    Rheumatoid arthritis(714.0)    Dr Dolphus takes Xeljanz  daily   Right wrist fracture    Skin cancer    Squamous cell carcinoma of skin 11/02/2001   in situ-right knee (cx67fu)   Squamous cell carcinoma of skin 11/12/2011   in situ-left forearm (CX35FU), in situ-left foot (CX35FU)   Squamous cell carcinoma of skin 01/22/2012   in situ-left lower forearm (txpbx)   Squamous cell carcinoma of skin 11/17/2012   right shin (txpbx)   Squamous cell carcinoma of skin 10/28/2013   in situ-right shoulder (CX35FU)   Squamous cell carcinoma of skin 04/28/2014   well diff-right shin (txpbx)   Squamous cell carcinoma of skin 11/02/2014   in situ-Left shin (txpbx)   Squamous cell carcinoma of skin 12/15/2014   in situ-Left hand (txpbx), bowens-left side chest (txpbx)   Squamous cell carcinoma of skin 05/09/2015   in situ-left hand (txpbx)    Squamous cell carcinoma of skin 05/07/2016   in situ-left inner shin,ant, in situ-left inner shin, post, in situ-left outer forearm, in situ-right knuckle   Squamous cell carcinoma of skin 03/1302018   in situ-left shoulder (txpbx), in situ-right inner shin (txpbx), in situ-top of left foot (txpbx), KA- left forearm (txpbx)   Squamous cell carcinoma of skin 12/02/2016   in situ-left inner shin (txpbx)   Squamous cell carcinoma of skin 03/04/2017   in situ-left outer sup, shin (txpbx), in situ- right 2nd knuckle finger (txpbx), in situ- Left wrist (txpbx)   Squamous cell carcinoma of skin 04/06/2018   in situ-above left knee inner (txpbx)    Squamous cell carcinoma of skin 04/21/2018   in situ-right top hand (txpbx)   Squamous cell carcinoma of skin 07/08/2018   in situ-right lower inner shin (txpbx)   Squamous cell carcinoma of skin 04/27/2019   in situ- Left neck(CX35FU), In situ- right neck (CX35FU)   Urinary retention    sees Dr.Wrenn about 2 times a yr   Past Surgical History:  Procedure Laterality Date   ABDOMINAL HYSTERECTOMY     1985   ANTERIOR  CERVICAL DECOMP/DISCECTOMY FUSION N/A 04/23/2022   Procedure: Anterior Cervical Decompression/Discectomy Fusion - Cervical Seven-Thoracic One;  Surgeon: Louis Shove, MD;  Location: [facility] OR;  Service: Neurosurgery;  Laterality: N/A;  3C   APPENDECTOMY     with TAH   BACK SURGERY     several   BREAST BIOPSY Right    Results were negative   BROW LIFT Bilateral 10/02/2016   Procedure: BILATERAL LOWER LID BLEPHAROPLASTY;  Surgeon: Laurie Loyd Redhead, MD;  Location: [facility] OR;  Service: Plastics;  Laterality: Bilateral;   CARPAL TUNNEL RELEASE Right    cataracts     CERVICAL FUSION     Dr Leeann   CERVICAL FUSION  12/31/2012   Dr Leeann   CERVICAL FUSION  04/2022   CHEST TUBE INSERTION     for traumatic Pneumothorax   COLONOSCOPY  07/16/2010   normal    COLONOSCOPY W/ POLYPECTOMY  1997   negative since; Dr Debrah   ESOPHAGOGASTRODUODENOSCOPY  07/16/2010   normal   eye lid raise     EYE SURGERY Bilateral    cataracts removed - /w IOL   HARDWARE REMOVAL  08/2021   HIP CLOSED REDUCTION Right 11/16/2023   Procedure: CLOSED MANIPULATION, JOINT, HIP;  Surgeon: Ernie Cough, MD;  Location: [facility];  Service: Orthopedics;  Laterality: Right;   HIP CLOSED REDUCTION N/A 11/30/2023   Procedure: CLOSED REDUCTION, HIP;  Surgeon: Fidel Rogue, MD;  Location: [facility] OR;  Service: Orthopedics;  Laterality: N/A;   INCISION AND DRAINAGE OF WOUND Right 11/28/2023   Procedure: IRRIGATION AND DEBRIDEMENT WOUND; REMOVAL OF HARDWARE;  Surgeon: Edna Toribio LABOR, MD;  Location: [facility] OR;  Service:  Orthopedics;  Laterality: Right;  I & D RIGHT ELBOW   JOINT REPLACEMENT     LUMBAR FUSION     Dr Leeann   NASAL SINUS SURGERY     ORIF ELBOW FRACTURE Right 11/20/2023   Procedure: OPEN REDUCTION INTERNAL FIXATION (ORIF) ELBOW/OLECRANON FRACTURE;  Surgeon: [name] Franky SQUIBB, MD;  Location: [facility] OR;  Service: Orthopedics;  Laterality: Right;   POSTERIOR CERVICAL FUSION/FORAMINOTOMY N/A 12/31/2012   Procedure: POSTERIOR LATERAL CERVICAL FUSION/FORAMINOTOMY LEVEL 1 CERVICAL THREE-FOUR WITH LATERAL MASS SCREWS;  Surgeon: Catalina CHRISTELLA Leeann, MD;  Location: [facility];  Service: Neurosurgery;  Laterality: N/A;   PTOSIS REPAIR Bilateral 10/02/2016   Procedure: INTERNAL PTOSIS REPAIR;  Surgeon: Laurie Loyd Redhead, MD;  Location: [facility] OR;  Service: Plastics;  Laterality: Bilateral;   SKIN BIOPSY     THYROID  SURGERY     R lobe removed- 1972, has grown back - CT last done- 2018   TOTAL HIP ARTHROPLASTY  12/22/2011   Procedure: TOTAL HIP ARTHROPLASTY;  Surgeon: Dempsey JINNY Sensor, MD;  Location: [facility] OR;  Service: Orthopedics;  Laterality: Left;   TOTAL HIP ARTHROPLASTY Right 11/12/2023   Procedure: ARTHROPLASTY, HIP, TOTAL, ANTERIOR APPROACH;  Surgeon: Fidel Rogue, MD;  Location: [facility];  Service: Orthopedics;  Laterality: Right;   TOTAL HIP REVISION Right 12/03/2023   Procedure: TOTAL HIP REVISION;  Surgeon: Fidel Rogue, MD;  Location: [facility];  Service: Orthopedics;  Laterality: Right;  Right acetabular revision Anterior approach   TOTAL SHOULDER ARTHROPLASTY     TUBAL LIGATION     UPPER GASTROINTESTINAL ENDOSCOPY  2012   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   WRIST SURGERY Left 09/16/2022   wrist/arm   Family History  Problem Relation Age of Onset   Cancer Mother        pancreatic  Diabetes Mother    Pancreatic cancer Mother    Heart disease Father        Rheumatic   Cancer Father        ? stomach   Stomach cancer Father    Cancer Sister        stomach   Stomach cancer Sister    Cancer  Maternal Aunt        X 4; ? primary   Heart disease Paternal Aunt    Cancer Maternal Grandmother        cervical   Colon cancer Other        Aunts   Multiple sclerosis Daughter    Hypertension Neg Hx    Stroke Neg Hx    Esophageal cancer Neg Hx    Rectal cancer Neg Hx    Social History:  reports that she quit smoking about 42 years ago. Her smoking use included cigarettes. She started smoking about 62 years ago. She has a 50 pack-year smoking history. She has never been exposed to tobacco smoke. She has never used smokeless tobacco. She reports current alcohol  use of about 14.0 standard drinks of alcohol  per week. She reports that she does not use drugs. Allergies:  Allergies  Allergen Reactions   Darvon [Propoxyphene Hcl] Shortness Of Breath    ? Dose Related ? Lowered respirations greatly   Demerol [Meperidine] Shortness Of Breath and Other (See Comments)    Respiratory Distress   Penicillins Hives and Other (See Comments)    Has patient had a PCN reaction causing immediate rash, facial/tongue/throat swelling, SOB or lightheadedness with hypotension: No SEVERE RASH INVOLVING MUCUS MEMBRANES or SKIN NECROSIS: #  #  #  YES  #  #  #  Has patient had a PCN reaction that required hospitalization No Has patient had a PCN reaction occurring within the last 10 years: No.    Imuran [Azathioprine] Other (See Comments)    Weakness   Sulfa  Antibiotics Nausea Only and Other (See Comments)    States had severe indigestion and heartburn and nausea and had to stop taking   Arava [Leflunomide] Other (See Comments)    Excessive weight gain   Lipitor [Atorvastatin ] Nausea And Vomiting    Currently taking 2 times weekly - able to tolerate low dose    Vytorin [Ezetimibe-Simvastatin] Nausea And Vomiting   Medications Prior to Admission  Medication Sig Dispense Refill   acetaminophen  (TYLENOL ) 325 MG tablet Take 2 tablets (650 mg total) by mouth every 6 (six) hours as needed for mild pain (pain  score 1-3) or fever (or Fever >/= 101). 20 tablet 0   aspirin  (ASPIRIN  CHILDRENS) 81 MG chewable tablet Chew 1 tablet (81 mg total) by mouth 2 (two) times daily with a meal. 90 tablet 0   atorvastatin  (LIPITOR) 10 MG tablet TAKE 1 TAB ON MONDAYS AND FRIDAY 26 tablet 4   Biotin  5000 MCG TABS Take 10,000 mcg by mouth in the morning.     Carboxymethylcellul-Glycerin  (LUBRICATING EYE DROPS OP) Place 1 drop into both eyes daily as needed (dry eyes).     carvedilol  (COREG ) 3.125 MG tablet Take 1 tablet (3.125 mg total) by mouth 2 (two) times daily with a meal. 180 tablet 3   cephALEXin  (KEFLEX ) 500 MG capsule Take 500 mg by mouth 3 (three) times daily. For 10 days     chlorhexidine  (HIBICLENS ) 4 % external liquid Apply 15 mLs (1 Application total) topically as directed for 30 doses. Use as directed  daily for 5 days every other week for 6 weeks. 946 mL 1   cholecalciferol  (VITAMIN D ) 25 MCG (1000 UNIT) tablet Take 1,000 Units by mouth in the morning.     cyclobenzaprine  (FLEXERIL ) 10 MG tablet TAKE 1 TABLET BY MOUTH THREE TIMES A DAY AS NEEDED FOR MUSCLE SPASM (Patient taking differently: Take 10 mg by mouth at bedtime.) 90 tablet 2   denosumab  (PROLIA ) 60 MG/ML SOSY injection Inject 60 mg into the skin every 6 (six) months. Deliver to rheum: [address], [address], [place], [place] [address]. Appt on 07/23/2022 1 mL 0   diazepam  (VALIUM ) 5 MG tablet TAKE 1 TABLET BY MOUTH AT BEDTIME AS NEEDED FOR MUSCLE SPASMS. (Patient taking differently: Take 5 mg by mouth daily as needed for muscle spasms.) 30 tablet 5   docusate sodium  (COLACE) 100 MG capsule Take 1 capsule (100 mg total) by mouth daily. 10 capsule 0   DULoxetine  (CYMBALTA ) 20 MG capsule Take 20 mg by mouth at bedtime.     folic acid  (FOLVITE ) 1 MG tablet Take 1 tablet (1 mg total) by mouth daily. 90 tablet 3   furosemide  (LASIX ) 20 MG tablet TAKE 1 TABLET BY MOUTH EVERY DAY 90 tablet 1   methotrexate  (RHEUMATREX) 2.5 MG tablet Take 5 tablets (12.5 mg  total) by mouth once a week. Caution:Chemotherapy. Protect from light. 60 tablet 0   mupirocin  ointment (BACTROBAN ) 2 % Place 1 Application into the nose 2 (two) times daily for 60 doses. Use as directed 2 times daily for 5 days every other week for 6 weeks. 60 g 0   oxybutynin  (DITROPAN ) 5 MG tablet Take 5 mg by mouth daily.     pantoprazole  (PROTONIX ) 40 MG tablet Take 1 tablet (40 mg total) by mouth 2 (two) times daily before a meal. 60 tablet 5   polyethylene glycol (MIRALAX  / GLYCOLAX ) 17 g packet Take 17 g by mouth daily as needed for mild constipation. 14 each 0   predniSONE  (DELTASONE ) 5 MG tablet TAKE 1 TABLET BY MOUTH EVERY DAY IN THE MORNING 90 tablet 0   senna (SENOKOT) 8.6 MG tablet Take 1 tablet by mouth 2 (two) times daily.     traMADol  (ULTRAM ) 50 MG tablet Take 25-50 mg by mouth every 8 (eight) hours as needed for moderate pain (pain score 4-6).     bisacodyl  (DULCOLAX) 10 MG suppository Place 1 suppository (10 mg total) rectally daily as needed for moderate constipation. 12 suppository 0   diclofenac sodium (VOLTAREN) 1 % GEL APPLY 3 GRAMS TO 3 LARGE JOINTS 3 TIMES A DAY AS NEEDED (Patient not taking: Reported on 11/27/2023) 100 g 0   Upadacitinib  ER ([name] ) 15 MG TB24 Take 15 mg by mouth daily. (Patient not taking: Reported on 11/27/2023)      Home: Home Living Family/patient expects to be discharged to:: Private residence Living Arrangements: Spouse/significant other Available Help at Discharge: Family, Available PRN/intermittently, Available 24 hours/day, Personal care attendant (Husband couldn't physically assist pt but could help with houshold management; Son leaves nearby; Daughter reports 24/7 aides) Type of Home: House Home Access: Ramped entrance Home Layout: Able to live on main level with bedroom/bathroom Bathroom Shower/Tub: Psychologist, counselling, Engineer, manufacturing systems: Handicapped height Bathroom Accessibility: Yes Home Equipment: Rollator (4 wheels), BSC/3in1,  Shower seat - built in, [employer] - tub/shower, Wheelchair - manual, Agricultural consultant (2 wheels), Other (comment) (adjustable bed) Additional Comments: Per dtr, 24/7 caregivers hired or family.  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  Lives With: Spouse  Functional History: Prior Function Prior Level of Function : Independent/Modified Independent, Driving Mobility Comments: Prior to May, independent without AD. Extensive fall history, all resulting in injuries. Pt reports at least 4 falls in the past 2-3 months. ADLs Comments: Indep with ADLs. Manages her own medications. Cooks/Cleans. Driving. Assists her husband prn. Functional Status:  Mobility: Bed Mobility Overal bed mobility: Needs Assistance Bed Mobility: Supine to Sit Supine to sit: Mod assist General bed mobility comments: Pt sat up on R side of bed with increased time and cues for sequencing. Assist to bring BLE off EOB and elevate trunk with use of bed pad. Scooted pt fwd til feet flat with bed pad. Transfers Overall transfer level: Needs assistance Equipment used: 2 person hand held assist Transfers: Sit to/from Stand, Bed to chair/wheelchair/BSC Sit to Stand: From elevated surface, Min assist, +2 physical assistance Bed to/from chair/wheelchair/BSC transfer type:: Squat pivot Stand pivot transfers: From elevated surface, Mod assist, +2 physical assistance Squat pivot transfers: Max assist, +2 physical assistance General transfer comment: Pt stood from raised bed height. Cues for proper hand placement using RW. Powered up with minAx2. Transferred to recliner chair on her left with modA x2, assist to manuever RW, and facilitate pivot turn. Good eccentric control. Ambulation/Gait General Gait Details: Unable    ADL: ADL Overall ADL's : Needs assistance/impaired Eating/Feeding: Sitting, Set up Grooming: Sitting, Brushing hair, Oral care, Wash/dry face Upper Body Bathing: Moderate assistance, Sitting Lower Body Bathing: Maximal assistance,  Sitting/lateral leans, Sit to/from stand Upper Body Dressing : Moderate assistance, Sitting Lower Body Dressing: Maximal assistance, Sitting/lateral leans, Sit to/from stand Toilet Transfer: Maximal assistance, +2 for physical assistance Toileting- Clothing Manipulation and Hygiene: Maximal assistance Functional mobility during ADLs: Maximal assistance, +2 for physical assistance  Cognition: Cognition Orientation Level: Oriented to person, Oriented to place, Oriented to situation, Disoriented to time Cognition Arousal: Alert Behavior During Therapy: [facility] for tasks assessed/performed  Blood pressure 139/87, pulse 96, temperature 98.3 F (36.8 C), resp. rate 17, height 5' 0.98 (1.549 m), weight 48.6 kg, SpO2 100%. Physical Exam Vitals and nursing note reviewed. Exam conducted with a chaperone present.  Constitutional:      Comments: Pt on low side of weight, appears bony in extremities; daughter at bedside; and husband/son joined 1/2 way through, initially sitting back in bed; then seen sitting up and on side when getting changed; No acute distress, however had anxiety attack/breaking concerns while I was in room for 8-10  minutes  HENT:     Head: Normocephalic.     Comments: Has ecchymoses on L side of face- per daughter, from intubation tube     Right Ear: External ear normal.     Left Ear: External ear normal.     Nose: Nose normal. No congestion.     Mouth/Throat:     Mouth: Mucous membranes are dry.     Pharynx: Oropharynx is clear. No oropharyngeal exudate.  Eyes:     General:        Right eye: No discharge.        Left eye: No discharge.     Extraocular Movements: Extraocular movements intact.  Cardiovascular:     Rate and Rhythm: Regular rhythm. Tachycardia present.     Heart sounds: Normal heart sounds. No murmur heard.    No gallop.     Comments: Rate 100-105 Pulmonary:     Effort: Pulmonary effort is normal. No respiratory distress.     Breath sounds: Normal breath  sounds. No wheezing, rhonchi or rales.     Comments: Eve during feeling like she couldn't breathe, pt was CTA b/l in all lobes front and back- was breathing rapidly maybe due to anxiety? Abdominal:     General: There is distension.     Palpations: Abdomen is soft.     Tenderness: There is no abdominal tenderness.     Comments: Hypoactive BS- LBM 7/7 per chart  Genitourinary:    Comments: Purewick in place- significant R labial ecchymoses- purple- not clear if was due to hip surgery or purewick? MASD significant around foam dressing on Sacrum (which per daughter was stage I pressure ulcer).  Musculoskeletal:     Comments: RUE-  deltoid/biceps at least 3/5- but painful ROM Large cast from above R elbow to R hand- can wiggle fingers on R hand LUE 5-/5 throughout LLE- 5-/5 throughout RLE- HF 2-/5 not clear if limited due to pain or neuro injury DF/PF 4/5 - neuro injury?  Skin:    Comments: VAC on R hip- has seal and suction R thigh drain in place Significant purple bruising on R lateral hip and thigh A lot of yellow bruising and abrasions on RLE Also has ecchymoses on LLE and Ue's  IV in L hand- and large foam dressing on L forearm RUE in cast/ACE wrap over it- fingers very slightly cool, but same temp as L hand  Neurological:     Mental Status: She is alert.     Comments: Slightly decreased safety awareness; focus on her anxiety- tangential somewhat Very HOH Decreased to light touch of entire RLE, per pt- medial, anterior and lateral and distal RLE Of note, has crossing of a few of her toes on R foot  Psychiatric:     Comments: Very anxious initially- calmed down after 10+ minutes, but was easy to make her anxious- I.e. the discussion of meeting criteria of CIR.     Results for orders placed or performed during the hospital encounter of 11/26/23 (from the past 24 hours)  Type and screen [facility]     Status: None   Collection Time: 12/06/23 12:43 PM  Result  Value Ref Range   ABO/RH(D) A POS    Antibody Screen NEG    Sample Expiration      12/09/2023,2359 Performed at [facility], [address] [place], [place] [address]    *Note: Due to a large number of results and/or encounters for the requested time period, some results have not been displayed. A complete set of results can be found in Results Review.   No results found.   Assessment/Plan: Diagnosis:  L total hip revision due to multiple dislocations- WBAT with posterio hip precautions and partial WB on RUE_ but can use platform walker Does the need for close, 24 hr/day medical supervision in concert with the patient's rehab needs make it unreasonable for this patient to be served in a less intensive setting? Yes Co-Morbidities requiring supervision/potential complications: RA, MS;  HOH, significant anxiety; frequent falls- R elbow/olecranon fx s/p ORIF and hardware removal due to it showing- dysphagia- refusing nectar thick liquids; on PO ABX for 4-6 weeks due to Elbow wound; has WOUND VAC- on R hip;  Due to bladder management, bowel management, safety, skin/wound care, disease management, medication administration, pain management, and patient education, does the patient require 24 hr/day rehab nursing? Yes Does the patient require coordinated care of a physician, rehab nurse, therapy disciplines of PT, OT and SLP for  swallowing to address physical and functional deficits in the context of the above medical diagnosis(es)? Yes Addressing deficits in the following areas: balance, endurance, locomotion, strength, transferring, bowel/bladder control, bathing, dressing, feeding, grooming, toileting, and swallowing Can the patient actively participate in an intensive therapy program of at least 3 hrs of therapy per day at least 5 days per week? Yes The potential for patient to make measurable gains while on inpatient rehab is fair Anticipated functional outcomes upon discharge from  inpatient rehab are supervision and min assist  with PT, supervision and min assist with OT, supervision with SLP. Estimated rehab length of stay to reach the above functional goals is: ~ 2 weeks Anticipated discharge destination: Home Overall Rehab/Functional Prognosis: good and fair  RECOMMENDATIONS: This patient's condition is appropriate for continued rehabilitative care in the following setting: CIR Patient has agreed to participate in recommended program. Yes Note that insurance prior authorization may be required for reimbursement for recommended care.  Comment:  Patient has Stage I sacral decub- has foam dressing- suggest turning q2 hours Has significant MASD- suggest nystatin for backside- maybe even a few days of Diflucan  since on ABX and steroids.  Has decrease in sensation on entire RLE- might have a neuro injury-hard to tease out from orthopedic injury/pain in RLE after hip revision. Might benefit from Buspar  for anxiety- had anxiety attack when I was in room that couldn't breathe, in spite of swallowing thin liquids- pt was not able to associate thin liquids with some coughing/choking sensation even though we discussed it- of note, but significant anxiety component after episode of drinking, of note. Sats looked good- HR 102; and Lungs CTA B/L of note Strongly discussed with pt and daughter who's retired [employer] fundraiser about needing to use nectar thick liquids- pt said she's think about it- if not, advised to sit up 90 degrees before drinking, which she did not follow 1x during interview.  Pt and family are not interested in going to SNF, due to a poor experience at one earlier in this group of falls. Pt is  an appropriate candidate for inpt rehab- she was advised will need to do 3 hours/day of therapy, spaced out- will goal of d/c in ~ 2 weeks. Suggest stopping IV Morphine - last dose yesterday, but cannot use in CIR. Agree with Norco dosing. Will submit for admissions coordinator to get pt,  hopefully, cleared by insurance. Per chart, pt's LBM was 7/7- strongly suggest assessing if this is correct and if so,  giving Sorbitol (having large amounts of pain meds from 7/10 til 7/12) 30 cc and then follow in 4-5 hours with soap suds enema if no results with Sorbitol on its own.  12. Thank you for this consult.    I spent a total of  84  minutes on total care today- >50% coordination of care- due to   Prolonged review of chart, due to complexity; review of MAR, meds given and ordered, labs, vitals and also had NT help take vitals while in room and help me assess skin; also prolonged d/w pt and daughter about nectar thick liquids, needs for 3 hours of therapy/day for CIR; and also d/w husband when he got there- they assured me of 24/7 care at home due to paid caregivers with aides as well- and documentation as well as d/w Admissions coordinator and NT.   Anhad Sheeley, MD 12/06/2023

## 2023-12-07 DIAGNOSIS — T8130XA Disruption of wound, unspecified, initial encounter: Secondary | ICD-10-CM | POA: Diagnosis not present

## 2023-12-07 LAB — HEMOGLOBIN AND HEMATOCRIT, BLOOD
HCT: 22.7 % — ABNORMAL LOW (ref 36.0–46.0)
Hemoglobin: 7.5 g/dL — ABNORMAL LOW (ref 12.0–15.0)

## 2023-12-07 LAB — CBC
HCT: 24.4 % — ABNORMAL LOW (ref 36.0–46.0)
Hemoglobin: 7.9 g/dL — ABNORMAL LOW (ref 12.0–15.0)
MCH: 32.5 pg (ref 26.0–34.0)
MCHC: 32.4 g/dL (ref 30.0–36.0)
MCV: 100.4 fL — ABNORMAL HIGH (ref 80.0–100.0)
Platelets: 292 K/uL (ref 150–400)
RBC: 2.43 MIL/uL — ABNORMAL LOW (ref 3.87–5.11)
RDW: 18.6 % — ABNORMAL HIGH (ref 11.5–15.5)
WBC: 7.3 K/uL (ref 4.0–10.5)
nRBC: 0.6 % — ABNORMAL HIGH (ref 0.0–0.2)

## 2023-12-07 LAB — BASIC METABOLIC PANEL WITH GFR
Anion gap: 8 (ref 5–15)
BUN: <5 mg/dL — ABNORMAL LOW (ref 8–23)
CO2: 25 mmol/L (ref 22–32)
Calcium: 8.1 mg/dL — ABNORMAL LOW (ref 8.9–10.3)
Chloride: 102 mmol/L (ref 98–111)
Creatinine, Ser: 0.67 mg/dL (ref 0.44–1.00)
GFR, Estimated: >60 mL/min (ref >60)
Glucose, Bld: 84 mg/dL (ref 70–99)
Potassium: 3.7 mmol/L (ref 3.5–5.1)
Sodium: 135 mmol/L (ref 135–145)

## 2023-12-07 LAB — AEROBIC CULTURE W GRAM STAIN (SUPERFICIAL SPECIMEN)
Culture: NO GROWTH
Culture: NO GROWTH

## 2023-12-07 LAB — PREPARE RBC (CROSSMATCH)

## 2023-12-07 MED ORDER — SODIUM CHLORIDE 0.9% IV SOLUTION: Freq: Once | INTRAVENOUS | Status: AC

## 2023-12-07 NOTE — Progress Notes (Signed)
 Speech Language Pathology Treatment: Dysphagia  Patient Details Name: Sherri [name] MRN: [id number] DOB: [birth year] Today's Date: 12/07/2023 Time: 1140-1200 SLP Time Calculation (min) (ACUTE ONLY): 20 min  Assessment / Plan / Recommendation Clinical Impression  Pt up in chair, no family at bedside today. Pt is drinking thin liquids, reports she can't tolerate nectar thick liquids. SLP reiterated rationale, thickened water  to nectar and pt tried it and again reported no it felt too funny on her tongue. SLP then shifted session to focus on strengthening exercises. Pt able to complete 30 reps of EMST Lite set to 20 cm H2O, though she did report some light headedness after she was done. Asked her to attempt 3 sets of 5 reps (15 total) 2x a day. Pt also able to complete 10 reps of CTAR with a small ball. Encouraged pt to complete these as much as tolerated. Explained how exercises to target base of tongue and pharyngeal musculature could help strengthen swallow and reduce residue. Pt hasn't been eating much. Says she is afraid to eat, the food is cold and sometimes she doesn't order. SLP assisted pt n ordering spaghetti which she says has been good in the past. Will advance pt to thin liquids since she understands risk and has elected to continue thin liquids.    HPI HPI: Pt is an 83 y.o. female who presented to the ED 7/4 due to concerns for exposed surgical hardware from previous elbow surgery in June. SLP consulted due to RN's observance of coughing with liquids. MBSS 11/30/23 recommended IDDSI 5 with nectar-thick liquids. [name] on 12/03/23.  PMH: hypertension, rheumatoid arthritis, chronic pain, urinary retention.      SLP Plan  Continue with current plan of care          Recommendations  Diet recommendations: Regular;Thin liquid Liquids provided via: Cup;Straw Medication Administration: Whole meds with puree Supervision: Patient able to self feed;Intermittent supervision  to cue for compensatory strategies Compensations: Slow rate;Small sips/bites;Follow solids with liquid Postural Changes and/or Swallow Maneuvers: Seated upright 90 degrees                  Oral care BID           Continue with current plan of care     Lorey Pallett, Consuelo Fitch  12/07/2023, 12:40 PM

## 2023-12-07 NOTE — Progress Notes (Signed)
 Physical Therapy Treatment Patient Details Name: Sherri [name] MRN: [id number] DOB: April 25, 1941 Today's Date: 12/07/2023   History of Present Illness Pt is 83 y.o. female presented to ED 11/26/23 due to visible hardware at Rt elbow fx. Pt s/p I&D of right elbow with hardware removal, olecranon excision, and triceps advancement 7/5. Pt noted to have a shortened RLE and X-ray confirmed R hip redislocation 7/7. Taken to the OR and reduction attempt was unsuccessful 7/7. Pt s/p R THA revision with head ball and liner exchange 7/10. 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 PMH significant for anxiety, GERD, HLD, HTN, MS, HOH, back pain, ACDF C7-T1.    PT Comments  Pt up in chair on arrival and agreeable to session with steady progress towards acute goals. Pt able to come to stand from recliner chair with min A to steady and boost on rise. Pt performing x2 bouts of gait with RW for support and mod A to maintain balance with cues for sequencing and chair follow for safety. Pt continues to fatigue quickly and is limited in standing tolerance by LE weakness and pain with x1 instance of knees buckling needing mod A to maintain standing. Pt up in chair at end of session with all needs met and SLP present. Patient will benefit from intensive inpatient follow-up therapy, >3 hours/day and continues to benefit from skilled PT services to progress toward functional mobility goals.     If plan is discharge home, recommend the following: Two people to help with walking and/or transfers;A lot of help with bathing/dressing/bathroom;Assistance with  cooking/housework;Assist for transportation;Help with stairs or ramp for entrance   Can travel by private vehicle     No  Equipment Recommendations  None recommended by PT (Pt already has DME)    Recommendations for Other Services       Precautions / Restrictions Precautions Precautions: Fall;Posterior Hip Precaution Booklet Issued: No Recall of Precautions/Restrictions: Impaired Precaution/Restrictions Comments: R JP drain, RLE wound vac Required Braces or Orthoses: Splint/Cast Splint/Cast: RUE Restrictions Weight Bearing Restrictions Per Provider Order: No RUE Weight Bearing Per Provider Order: Partial weight bearing RUE Partial Weight Bearing Percentage or Pounds: okay to use walker RLE Weight Bearing Per Provider Order: Weight bearing as tolerated     Mobility  Bed Mobility Overal bed mobility: Needs Assistance             General bed mobility comments: pt up in chair on arrival and at end of session    Transfers Overall transfer level: Needs assistance Equipment used: Rolling walker (2 wheels) Transfers: Sit to/from Stand, Bed to chair/wheelchair/BSC Sit to Stand: Min assist           General transfer comment: Pt stood from recliner. Cues for proper hand placement using RW.    Ambulation/Gait Ambulation/Gait assistance: Mod assist, +2 physical assistance, +2 safety/equipment Gait Distance (Feet): 3 Feet (x2) Assistive device: Rolling walker (2 wheels) Gait Pattern/deviations: Step-to pattern, Decreased stride length, Decreased dorsiflexion - right, Decreased weight shift to right, Knees buckling, Antalgic       General Gait Details: pt taking short steps with RW needing cues for sequencing each step, pt fearful of  falling with knees buckling x1 with mod A to maintain balance, chair follow for safety   Stairs             Wheelchair Mobility     Tilt Bed    Modified Rankin (Stroke Patients Only)       Balance Overall balance  assessment: Needs assistance, History of Falls Sitting-balance support: Feet supported, No upper extremity supported Sitting balance-Leahy Scale: Fair Sitting balance - Comments: Pt sat EOB with supervision-CGA.   Standing balance support: Bilateral upper extremity supported, During functional activity, Reliant on assistive device for balance Standing balance-Leahy Scale: Poor Standing balance comment: reliant on external support                            Communication Communication Communication: Impaired Factors Affecting Communication: Hearing impaired  Cognition Arousal: Alert Behavior During Therapy: WFL for tasks assessed/performed   PT - Cognitive impairments: Initiation, Sequencing, Problem solving, Safety/Judgement                         Following commands: Intact      Cueing Cueing Techniques: Verbal cues, Gestural cues  Exercises General Exercises - Lower Extremity Ankle Circles/Pumps: AROM, Right, 10 reps, Seated Long Arc Quad: AROM, Right, 10 reps, Seated Hip Flexion/Marching: AAROM, Right, 10 reps, Seated    General Comments General comments (skin integrity, edema, etc.): VSS on RA      Pertinent Vitals/Pain Pain Assessment Pain Assessment: Faces Faces Pain Scale: Hurts little more Pain Location: R hip Pain Descriptors / Indicators: Operative site guarding, Discomfort, Aching Pain Intervention(s): Monitored during session, Limited activity within patient's tolerance    Home Living                          Prior Function            PT Goals (current goals can now be found in the care plan section) Acute Rehab PT Goals Patient Stated Goal: Get better and be independent again PT Goal Formulation: With patient/family Time For Goal Achievement: 12/18/23 Progress towards PT goals: Progressing toward goals    Frequency    Min 3X/week      PT Plan      Co-evaluation              AM-PAC PT 6 Clicks  Mobility   Outcome Measure  Help needed turning from your back to your side while in a flat bed without using bedrails?: Total Help needed moving from lying on your back to sitting on the side of a flat bed without using bedrails?: Total Help needed moving to and from a bed to a chair (including a wheelchair)?: A Lot Help needed standing up from a chair using your arms (e.g., wheelchair or bedside chair)?: A Little Help needed to walk in hospital room?: Total Help needed climbing 3-5 steps with a railing? : Total 6 Click Score: 9    End of Session Equipment Utilized During Treatment: Gait belt Activity Tolerance: Patient tolerated treatment well;Patient limited by fatigue Patient left: in chair;with call bell/phone within reach;with chair alarm set;Other (comment) (with SLP Consuelo present) Nurse Communication: Mobility status PT Visit Diagnosis: Difficulty in walking, not elsewhere classified (R26.2);Other abnormalities of gait and mobility (R26.89);Muscle weakness (generalized) (M62.81);Unsteadiness on feet (R26.81);History of falling (Z91.81);Repeated falls (R29.6)     Time: 8883-8866 PT Time Calculation (min) (ACUTE ONLY): 17  min  Charges:    $Gait Training: 8-22 mins PT General Charges $$ ACUTE PT VISIT: 1 Visit                     Jonh Mcqueary R. PTA Acute Rehabilitation Services Office: [phone]   Therisa CHRISTELLA Boor 12/07/2023, 1:31 PM

## 2023-12-07 NOTE — Plan of Care (Signed)
   Problem: Education: Goal: Knowledge of General Education information will improve Description: Including pain rating scale, medication(s)/side effects and non-pharmacologic comfort measures Outcome: Progressing   Problem: Health Behavior/Discharge Planning: Goal: Ability to manage health-related needs will improve Outcome: Progressing   Problem: Clinical Measurements: Goal: Ability to maintain clinical measurements within normal limits will improve Outcome: Progressing Goal: Will remain free from infection Outcome: Progressing Goal: Diagnostic test results will improve Outcome: Progressing Goal: Respiratory complications will improve Outcome: Progressing Goal: Cardiovascular complication will be avoided Outcome: Progressing   Problem: Activity: Goal: Risk for activity intolerance will decrease Outcome: Progressing   Problem: Nutrition: Goal: Adequate nutrition will be maintained Outcome: Progressing   Problem: Coping: Goal: Level of anxiety will decrease Outcome: Progressing   Problem: Elimination: Goal: Will not experience complications related to bowel motility Outcome: Progressing Goal: Will not experience complications related to urinary retention Outcome: Progressing   Problem: Pain Managment: Goal: General experience of comfort will improve and/or be controlled Outcome: Progressing   Problem: Safety: Goal: Ability to remain free from injury will improve Outcome: Progressing   Problem: Skin Integrity: Goal: Risk for impaired skin integrity will decrease Outcome: Progressing   Problem: Education: Goal: Knowledge of the prescribed therapeutic regimen will improve Outcome: Progressing Goal: Understanding of discharge needs will improve Outcome: Progressing Goal: Individualized Educational Video(s) Outcome: Progressing   Problem: Activity: Goal: Ability to avoid complications of mobility impairment will improve Outcome: Progressing Goal: Ability to  tolerate increased activity will improve Outcome: Progressing   Problem: Clinical Measurements: Goal: Postoperative complications will be avoided or minimized Outcome: Progressing   Problem: Pain Management: Goal: Pain level will decrease with appropriate interventions Outcome: Progressing   Problem: Skin Integrity: Goal: Will show signs of wound healing Outcome: Progressing

## 2023-12-07 NOTE — Progress Notes (Signed)
 Subjective:  Patient reports pain as mild.  Denies N/V/CP/SOB/Abd pain.  Daughter at bedside.  She is sitting in recliner this am.  She is hopeful for CIR, evaluation yesterday, pending. No acute events overnight.  Mild groin pain.  She took a step with PT today.    Objective:   VITALS:   Vitals:   12/06/23 2042 12/07/23 0300 12/07/23 0727 12/07/23 1430  BP: 128/86 113/79 134/73 (!) 115/58  Pulse: 92 93 (!) 107 95  Resp: 16 17 18 18   Temp: 98.4 F (36.9 C) 97.8 F (36.6 C) 98.6 F (37 C) 98.5 F (36.9 C)  TempSrc: Oral Oral Oral Oral  SpO2: 100% 100% 98% 98%  Weight:      Height:        NAD, sitting in recliner.   Neurologically intact Neurovascular intact Sensation intact distally Intact pulses distally Dorsiflexion/Plantar flexion intact Compartment soft Prevena dressing C/D, medial edge near groin not sealed fully, still maintaining pressure. Cleansed skin, reinforced with dressing.  JP drain dressing changed. 60cc SS output. Continue drain.   Moving toes well on exam today No shortening or ER noted.   Lab Results  Component Value Date   WBC 7.3 12/07/2023   HGB 7.5 (L) 12/07/2023   HCT 22.7 (L) 12/07/2023   MCV 100.4 (H) 12/07/2023   PLT 292 12/07/2023   BMET    Component Value Date/Time   NA 135 12/07/2023 0653   NA 139 06/20/2019 1335   NA 136 02/22/2015 0000   K 3.7 12/07/2023 0653   K 4.4 02/22/2015 0000   CL 102 12/07/2023 0653   CL 98 02/22/2015 0000   CO2 25 12/07/2023 0653   CO2 30 02/22/2015 0000   GLUCOSE 84 12/07/2023 0653   BUN <5 (L) 12/07/2023 0653   BUN 10 06/20/2019 1335   CREATININE 0.67 12/07/2023 0653   CREATININE 1.03 (H) 08/06/2023 1510   CALCIUM  8.1 (L) 12/07/2023 0653   CALCIUM  9.6 02/22/2015 0000   EGFR 55 (L) 08/06/2023 1510   GFRNONAA >60 12/07/2023 0653   GFRNONAA 56 (L) 09/26/2020 1536     Assessment/Plan: 4 Days Post-Op   Principal Problem:   Wound dehiscence Active Problems:    Hyperlipidemia   Multiple sclerosis (HCC)   Essential hypertension   Rheumatoid arthritis (HCC)   Hyponatremia   Macrocytic anemia   Cellulitis of right elbow   ABLA. Hemoglobin 7.5. Continue to monitor. Aspirin  held due to high bleeding risk.    Intraoperative cultures, grain stains negative, no growth so far. Continue to monitor.    WBAT with walker RLE, posterior hip precautions. RUE per Dr. Edna, partial weightbearing, okay to use walker.  DVT ppx: Aspirin  held due to high bleeding risk, SCDs, TEDS PO pain control PT/OT: Mobilize with PT as tolerated.  Dispo: - Patient under care of the medical team, disposition per their recommendation.  - Mobilize out of bed with PT.  - Follow IntraOp cultures.  - We will discontinue the JP drain when output is less than 10 cc per shift, okay to go home with if high output.  - Continue negative pressure dressing at 75 mmHg. Upon discharge, the house VAC suction unit will need to be exchanged for a portable Prevena suction unit.  - She will need to follow-up in the office within 7 days of discharge for removal of her negative pressure dressing.    Valery GORMAN Potters 12/07/2023, 6:12 PM   [facility]  [place] [address]  [address], [address], [place], [place] [address] Phone: [phone] [email or web address] Facebook  [employer]

## 2023-12-07 NOTE — Progress Notes (Signed)
 Inpatient Rehab Admissions Coordinator:  Saw pt at bedside. Updated her that began insurance authorization yesterday. Await authorization decision. Will continue to follow.   Tinnie Yvone Cohens, MS, CCC-SLP Admissions Coordinator [phone]

## 2023-12-07 NOTE — Progress Notes (Signed)
 PROGRESS NOTE    Sherri  Ashlye Oviedo [name]  FMW:[id number] DOB: [birth year] DOA: 11/26/2023 PCP: [name] PARAS, MD  No chief complaint on file.   Brief Narrative:   Sherri  Ulises Wolfinger [name] is an 83 y.o. 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   Assessment & Plan:   Principal Problem:   Wound dehiscence Active Problems:   Hyponatremia   Hyperlipidemia   Multiple sclerosis (HCC)   Essential hypertension   Rheumatoid arthritis (HCC)   Macrocytic anemia   Cellulitis of right elbow  Plain appears to be for inpatient rehab  Macrocytic anemia/acute blood loss anemia Symptomatic Anemia Normal B12, folate Iron studies suggestive of AOC Hb downtrending - she c/o lightheadedness, will transfuse 1 unit pRBC  Urinary Urgency UA with large LE, WBC's Will monitor - consider culture if additional symptoms  Right elbow visible hardware with Wound dehiscence with cellulitis: Orthopedic surgery to recommend further antibiotics. Status post I&D of the right elbow on 11/27/2023 and hardware removal. Blood cx NGTD x5  Based on my discussion with Dr. Edna, he's ok with 2 weeks abx and narrowing to basic skin flora coverage (his note from 7/6 mentioned keflex  - will use duricef for ease of dosing). Orthopedic surgery recommended aspirin  for 4 weeks. Follow-up with orthopedic surgery in 2 weeks. PT eval is pending.   Superior Dislocation of R hip Arthroplasty S/p closed reduction on 7/7 S/p revision of R THA, head ball and liner exchange Pending intra-op cultures  Dysphagia SLP recommending nectar thick liquids, she's not willing to do  this  Hypovolemic hyponatremia: trend   Hypokalemia: improved   Rheumatoid arthritis: Continue methotrexate  and prednisone .   History of multiple sclerosis: She is no longer on these medication.   Chronic pain: Continue Cymbalta .   Urinary retention: Continue oxybutynin .   History of recent right hip surgery in May 2025: Family planning for home   Diffuse Ecchymoses Bruises all over Platelets ok, will monitor Continue to monitor, may need follow up with derm and/or hematology  Goals of care: Seen by palliative 7/5, they've signed off    DVT prophylaxis: SCD Code Status: full Family Communication: none at bedside Disposition:   Status is: Inpatient Remains inpatient appropriate because: need for ongoing care   Consultants:  orthopedics  Procedures:  7/7 Closed reduction right hip.   7/5  Irrigation debridement of right elbow with hardware removal olecranon excision and triceps advancement   Antimicrobials:  Anti-infectives (From admission, onward)    Start     Dose/Rate Route Frequency Ordered Stop   12/05/23 1430  cefadroxil  (DURICEF) capsule 500 mg        500 mg Oral 2 times daily 12/05/23 1337     12/04/23 0800  vancomycin  (VANCOCIN ) IVPB 1000 mg/200 mL premix        1,000 mg 200 mL/hr over 60 Minutes Intravenous Every 12 hours 12/04/23 0508 12/04/23 0925   12/03/23 1639  vancomycin  (VANCOCIN ) powder  Status:  Discontinued          As needed 12/03/23 1639 12/03/23 1810   12/03/23 1300  ceFAZolin  (ANCEF ) IVPB 2g/100 mL premix        2 g 200 mL/hr over 30 Minutes Intravenous On call to O.R.  12/03/23 1241 12/03/23 1512   12/03/23 1300  vancomycin  (VANCOCIN ) IVPB 1000 mg/200 mL premix        1,000 mg 200 mL/hr over 60 Minutes Intravenous On call to O.R. 12/03/23 1241 12/03/23 1403   12/03/23 1257  ceFAZolin  (ANCEF ) 2-4 GM/100ML-% IVPB       Note to Pharmacy: Sonny Mays B: cabinet override      12/03/23 1257 12/03/23 1524   11/30/23 1700   cefUROXime  (CEFTIN ) tablet 500 mg  Status:  Discontinued        500 mg Oral 2 times daily with meals 11/30/23 1021 12/05/23 1336   11/30/23 1100  doxycycline  (VIBRA -TABS) tablet 100 mg  Status:  Discontinued        100 mg Oral Every 12 hours 11/30/23 1005 11/30/23 1021   11/30/23 1100  doxycycline  (VIBRA -TABS) tablet 100 mg  Status:  Discontinued        100 mg Oral Every 12 hours 11/30/23 1021 12/05/23 1336   11/28/23 2200  ceFAZolin  (ANCEF ) IVPB 2g/100 mL premix        2 g 200 mL/hr over 30 Minutes Intravenous Every 8 hours 11/28/23 1714 11/29/23 0558   11/28/23 1453  vancomycin  (VANCOCIN ) powder  Status:  Discontinued          As needed 11/28/23 1453 11/28/23 1553   11/28/23 1315  ceFAZolin  (ANCEF ) IVPB 2g/100 mL premix        2 g 200 mL/hr over 30 Minutes Intravenous  Once 11/28/23 1303 11/28/23 1421   11/28/23 1315  vancomycin  (VANCOCIN ) IVPB 1000 mg/200 mL premix  Status:  Discontinued        1,000 mg 200 mL/hr over 60 Minutes Intravenous  Once 11/28/23 1303 11/28/23 1645   11/28/23 0000  cefTRIAXone  (ROCEPHIN ) 1 g in sodium chloride  0.9 % 100 mL IVPB  Status:  Discontinued        1 g 200 mL/hr over 30 Minutes Intravenous Every 24 hours 11/27/23 0702 11/27/23 0856   11/27/23 1100  ceFAZolin  (ANCEF ) IVPB 2g/100 mL premix  Status:  Discontinued        2 g 200 mL/hr over 30 Minutes Intravenous On call to O.R. 11/27/23 0216 11/28/23 0559   11/27/23 1100  vancomycin  (VANCOCIN ) IVPB 1000 mg/200 mL premix  Status:  Discontinued        1,000 mg 200 mL/hr over 60 Minutes Intravenous On call to O.R. 11/27/23 0216 11/28/23 0559   11/27/23 0900  cefTRIAXone  (ROCEPHIN ) 1 g in sodium chloride  0.9 % 100 mL IVPB  Status:  Discontinued        1 g 200 mL/hr over 30 Minutes Intravenous Every 24 hours 11/27/23 0856 11/30/23 1050   11/27/23 0600  cephALEXin  (KEFLEX ) capsule 500 mg  Status:  Discontinued        500 mg Oral 3 times daily 11/27/23 0519 11/27/23 0702   11/27/23 0430  ceFAZolin  (ANCEF )  IVPB 2g/100 mL premix  Status:  Discontinued        2 g 200 mL/hr over 30 Minutes Intravenous  Once 11/27/23 0422 11/27/23 0517       Subjective: No new complaints Had bad morning - took Rafan Sanders while for nursing to get around to her  Objective: Vitals:   12/06/23 2042 12/07/23 0300 12/07/23 0727 12/07/23 1430  BP: 128/86 113/79 134/73 (!) 115/58  Pulse: 92 93 (!) 107 95  Resp: 16 17 18 18   Temp: 98.4 F (36.9 C) 97.8 F (36.6 C) 98.6 F (37 C) 98.5  F (36.9 C)  TempSrc: Oral Oral Oral Oral  SpO2: 100% 100% 98% 98%  Weight:      Height:        Intake/Output Summary (Last 24 hours) at 12/07/2023 1556 Last data filed at 12/07/2023 1300 Gross per 24 hour  Intake 240 ml  Output 700 ml  Net -460 ml   Filed Weights   11/26/23 2012 11/28/23 1238 12/03/23 1244  Weight: 48.6 kg 48.6 kg 48.6 kg    Examination:  General: No acute distress. Cardiovascular: RRR Lungs: unlabored Neurological: Alert and oriented 3. Moves all extremities 4 with equal strength. Cranial nerves II through XII grossly intact. Extremities: splint to RUE, dressing to RLE - JP with serosanguinous drainage   Data Reviewed: I have personally reviewed following labs and imaging studies  CBC: Recent Labs  Lab 12/03/23 0726 12/04/23 0628 12/07/23 0653 12/07/23 1246  WBC 6.1 10.5 7.3  --   NEUTROABS 4.1 8.4*  --   --   HGB 8.8* 8.7* 7.9* 7.5*  HCT 27.2* 27.9* 24.4* 22.7*  MCV 99.6 104.9* 100.4*  --   PLT 283 243 292  --     Basic Metabolic Panel: Recent Labs  Lab 12/03/23 0726 12/04/23 0628 12/07/23 0653  NA 136 132* 135  K 3.8 4.7 3.7  CL 101 100 102  CO2 27 20* 25  GLUCOSE 90 99 84  BUN 6* 8 <5*  CREATININE 0.64 0.67 0.67  CALCIUM  8.0* 7.7* 8.1*  MG 1.8 1.7  --   PHOS 2.9 3.7  --     GFR: Estimated Creatinine Clearance: 41.6 mL/min (by C-G formula based on SCr of 0.67 mg/dL).  Liver Function Tests: Recent Labs  Lab 12/03/23 0726 12/04/23 0628  AST 19 26  ALT 9 6  ALKPHOS  57 52  BILITOT 0.8 1.3*  PROT 4.9* 4.8*  ALBUMIN  2.1* 2.2*    CBG: No results for input(s): GLUCAP in the last 168 hours.   Recent Results (from the past 240 hours)  Surgical PCR screen     Status: None   Collection Time: 12/03/23 10:24 AM   Specimen: Nasal Mucosa; Nasal Swab  Result Value Ref Range Status   MRSA, PCR NEGATIVE NEGATIVE Final   Staphylococcus aureus NEGATIVE NEGATIVE Final    Comment: (NOTE) The Xpert SA Assay (FDA approved for NASAL specimens in patients 68 years of age and older), is one component of [facility] comprehensive surveillance program. It is not intended to diagnose infection nor to guide or monitor treatment. Performed at [facility], [address], [place], [place] [address]   Aerobic/Anaerobic Culture w Gram Stain (surgical/deep wound)     Status: None (Preliminary result)   Collection Time: 12/03/23  4:00 PM   Specimen: Synovial, Right Hip; Body Fluid  Result Value Ref Range Status   Specimen Description   Final    SYNOVIAL RIGHT HIP Performed at [facility], [address] [place], [place] [address]    Special Requests   Final    NONE Performed at [facility], [address] [place], [place] [address]    Gram Stain   Final    RARE WBC PRESENT, PREDOMINANTLY PMN NO ORGANISMS SEEN    Culture   Final    NO GROWTH 4 DAYS NO ANAEROBES ISOLATED; CULTURE IN PROGRESS FOR 5 DAYS Performed at [facility], [address], [place], [place] [address]    Report Status PENDING  Incomplete  Aerobic/Anaerobic  Culture w Gram Stain (surgical/deep wound)     Status: None (Preliminary result)   Collection Time: 12/03/23  4:00 PM   Specimen: Synovial, Right Hip; Body Fluid  Result Value Ref Range Status   Specimen Description   Final    SYNOVIAL RIGHT HIP Performed at [facility], [address] [place], [place] [address]    Special Requests   Final    NONE Performed at [facility], [address], [place], [place] [address]    Gram Stain NO WBC SEEN NO ORGANISMS SEEN   Final   Culture   Final    NO GROWTH 4 DAYS NO ANAEROBES ISOLATED; CULTURE IN PROGRESS FOR 5 DAYS Performed at [facility], [address] [place], [place] [address]    Report Status PENDING  Incomplete  Aerobic Culture w Gram Stain (superficial specimen)     Status: None   Collection Time: 12/03/23  4:00 PM   Specimen: Synovium  Result Value Ref Range Status   Specimen Description SYNOVIAL  Final   Special Requests RT HIP SYNOVIAL FL 1  Final   Gram Stain   Final    RARE WBC PRESENT, PREDOMINANTLY PMN NO ORGANISMS SEEN    Culture   Final    NO GROWTH 3 DAYS Performed at [facility], [address], [place], [place] [address]    Report Status 12/07/2023 FINAL  Final  Aerobic Culture w Gram Stain (superficial specimen)     Status: None   Collection Time: 12/03/23  4:00 PM   Specimen: Synovium  Result Value Ref Range Status   Specimen Description SYNOVIAL  Final   Special Requests RT HIP SUB FL 1  Final   Gram Stain   Final    RARE WBC PRESENT, PREDOMINANTLY PMN NO ORGANISMS SEEN    Culture   Final    NO GROWTH 3 DAYS Performed at [facility], [address], [place], [place] [address]    Report Status 12/07/2023 FINAL  Final         Radiology Studies: No results found.        Scheduled Meds:  sodium chloride    Intravenous Once   atorvastatin   10 mg Oral Daily   carvedilol   3.125 mg Oral BID WC   cefadroxil   500 mg Oral BID   Chlorhexidine  Gluconate Cloth  6 each Topical Daily   cyclobenzaprine   10 mg Oral QHS   docusate sodium   100 mg Oral Daily   DULoxetine   20 mg Oral QHS   feeding supplement  237 mL Oral BID BM   folic acid   1 mg Oral Daily   mouth rinse  15 mL Mouth Rinse 4 times per day   pantoprazole   40 mg Oral BID AC   polyethylene glycol  17 g Oral BID   predniSONE   5 mg Oral Q breakfast   senna  1 tablet  Oral BID   Continuous Infusions:  sodium chloride  75 mL/hr at 12/04/23 0727     LOS: 10 days    Time spent: over 30 min    Meliton Monte, MD [facility]   To contact the attending provider between 7A-7P or the covering provider during after hours 7P-7A, please log into the web site [email or web address] and access using universal [place] password for that web site. If you do not have the password, please call the hospital operator.  12/07/2023, 3:56 PM

## 2023-12-07 NOTE — Progress Notes (Signed)
 Inpatient Rehab Admissions Coordinator:  Insurance is offering a Audiological scientist to be completed by [employer]. Dr. Babs is aware.   Tinnie Yvone Cohens, MS, CCC-SLP Admissions Coordinator [phone]

## 2023-12-08 ENCOUNTER — Inpatient Hospital Stay (HOSPITAL_COMMUNITY)
Admission: AD | Admit: 2023-12-08 | Discharge: 2023-12-23 | DRG: 945 | Disposition: A | Discharge status: Home Health | Source: Intra-hospital | Attending: Physical Medicine & Rehabilitation | Admitting: Physical Medicine & Rehabilitation

## 2023-12-08 DIAGNOSIS — R5381 Other malaise: Secondary | ICD-10-CM | POA: Diagnosis not present

## 2023-12-08 DIAGNOSIS — L97119 Non-pressure chronic ulcer of right thigh with unspecified severity: Secondary | ICD-10-CM | POA: Diagnosis present

## 2023-12-08 DIAGNOSIS — E785 Hyperlipidemia, unspecified: Secondary | ICD-10-CM | POA: Diagnosis present

## 2023-12-08 DIAGNOSIS — L0390 Cellulitis, unspecified: Secondary | ICD-10-CM | POA: Diagnosis not present

## 2023-12-08 DIAGNOSIS — Z820 Family history of epilepsy and other diseases of the nervous system: Secondary | ICD-10-CM

## 2023-12-08 DIAGNOSIS — Z833 Family history of diabetes mellitus: Secondary | ICD-10-CM

## 2023-12-08 DIAGNOSIS — Z85828 Personal history of other malignant neoplasm of skin: Secondary | ICD-10-CM

## 2023-12-08 DIAGNOSIS — T8149XA Infection following a procedure, other surgical site, initial encounter: Secondary | ICD-10-CM | POA: Diagnosis present

## 2023-12-08 DIAGNOSIS — I10 Essential (primary) hypertension: Secondary | ICD-10-CM | POA: Diagnosis present

## 2023-12-08 DIAGNOSIS — Z888 Allergy status to other drugs, medicaments and biological substances status: Secondary | ICD-10-CM

## 2023-12-08 DIAGNOSIS — R339 Retention of urine, unspecified: Secondary | ICD-10-CM | POA: Diagnosis not present

## 2023-12-08 DIAGNOSIS — E876 Hypokalemia: Secondary | ICD-10-CM | POA: Diagnosis present

## 2023-12-08 DIAGNOSIS — Z96641 Presence of right artificial hip joint: Principal | ICD-10-CM | POA: Diagnosis present

## 2023-12-08 DIAGNOSIS — N3281 Overactive bladder: Secondary | ICD-10-CM | POA: Diagnosis present

## 2023-12-08 DIAGNOSIS — R1310 Dysphagia, unspecified: Secondary | ICD-10-CM | POA: Diagnosis not present

## 2023-12-08 DIAGNOSIS — E871 Hypo-osmolality and hyponatremia: Secondary | ICD-10-CM | POA: Diagnosis not present

## 2023-12-08 DIAGNOSIS — M069 Rheumatoid arthritis, unspecified: Secondary | ICD-10-CM | POA: Diagnosis not present

## 2023-12-08 DIAGNOSIS — N390 Urinary tract infection, site not specified: Secondary | ICD-10-CM | POA: Diagnosis not present

## 2023-12-08 DIAGNOSIS — Z8249 Family history of ischemic heart disease and other diseases of the circulatory system: Secondary | ICD-10-CM

## 2023-12-08 DIAGNOSIS — T8130XA Disruption of wound, unspecified, initial encounter: Secondary | ICD-10-CM | POA: Diagnosis not present

## 2023-12-08 DIAGNOSIS — F419 Anxiety disorder, unspecified: Secondary | ICD-10-CM | POA: Diagnosis present

## 2023-12-08 DIAGNOSIS — Z800 Family history of malignant neoplasm of digestive organs: Secondary | ICD-10-CM

## 2023-12-08 DIAGNOSIS — Z974 Presence of external hearing-aid: Secondary | ICD-10-CM

## 2023-12-08 DIAGNOSIS — Z471 Aftercare following joint replacement surgery: Secondary | ICD-10-CM

## 2023-12-08 DIAGNOSIS — K5900 Constipation, unspecified: Secondary | ICD-10-CM | POA: Diagnosis present

## 2023-12-08 DIAGNOSIS — L03115 Cellulitis of right lower limb: Secondary | ICD-10-CM | POA: Diagnosis present

## 2023-12-08 DIAGNOSIS — Y792 Prosthetic and other implants, materials and accessory orthopedic devices associated with adverse incidents: Secondary | ICD-10-CM | POA: Diagnosis present

## 2023-12-08 DIAGNOSIS — K219 Gastro-esophageal reflux disease without esophagitis: Secondary | ICD-10-CM | POA: Diagnosis not present

## 2023-12-08 DIAGNOSIS — G35 Multiple sclerosis: Secondary | ICD-10-CM | POA: Diagnosis present

## 2023-12-08 DIAGNOSIS — Z860100 Personal history of colon polyps, unspecified: Secondary | ICD-10-CM

## 2023-12-08 DIAGNOSIS — Z882 Allergy status to sulfonamides status: Secondary | ICD-10-CM

## 2023-12-08 DIAGNOSIS — T8141XD Infection following a procedure, superficial incisional surgical site, subsequent encounter: Secondary | ICD-10-CM

## 2023-12-08 DIAGNOSIS — F0390 Unspecified dementia without behavioral disturbance: Secondary | ICD-10-CM | POA: Diagnosis present

## 2023-12-08 DIAGNOSIS — Z79631 Long term (current) use of antimetabolite agent: Secondary | ICD-10-CM | POA: Diagnosis not present

## 2023-12-08 DIAGNOSIS — Z87891 Personal history of nicotine dependence: Secondary | ICD-10-CM | POA: Diagnosis not present

## 2023-12-08 DIAGNOSIS — Z87440 Personal history of urinary (tract) infections: Secondary | ICD-10-CM

## 2023-12-08 DIAGNOSIS — D509 Iron deficiency anemia, unspecified: Secondary | ICD-10-CM | POA: Diagnosis present

## 2023-12-08 DIAGNOSIS — M810 Age-related osteoporosis without current pathological fracture: Secondary | ICD-10-CM | POA: Diagnosis present

## 2023-12-08 DIAGNOSIS — S42401A Unspecified fracture of lower end of right humerus, initial encounter for closed fracture: Secondary | ICD-10-CM | POA: Diagnosis present

## 2023-12-08 DIAGNOSIS — K5901 Slow transit constipation: Secondary | ICD-10-CM | POA: Diagnosis not present

## 2023-12-08 DIAGNOSIS — Z7401 Bed confinement status: Secondary | ICD-10-CM | POA: Diagnosis not present

## 2023-12-08 DIAGNOSIS — Z96643 Presence of artificial hip joint, bilateral: Secondary | ICD-10-CM | POA: Diagnosis not present

## 2023-12-08 DIAGNOSIS — E222 Syndrome of inappropriate secretion of antidiuretic hormone: Secondary | ICD-10-CM | POA: Diagnosis not present

## 2023-12-08 DIAGNOSIS — R6889 Other general symptoms and signs: Secondary | ICD-10-CM | POA: Diagnosis not present

## 2023-12-08 DIAGNOSIS — R112 Nausea with vomiting, unspecified: Secondary | ICD-10-CM | POA: Diagnosis not present

## 2023-12-08 DIAGNOSIS — S72001D Fracture of unspecified part of neck of right femur, subsequent encounter for closed fracture with routine healing: Secondary | ICD-10-CM

## 2023-12-08 DIAGNOSIS — Z885 Allergy status to narcotic agent status: Secondary | ICD-10-CM

## 2023-12-08 DIAGNOSIS — D62 Acute posthemorrhagic anemia: Secondary | ICD-10-CM | POA: Diagnosis not present

## 2023-12-08 DIAGNOSIS — Z743 Need for continuous supervision: Secondary | ICD-10-CM | POA: Diagnosis not present

## 2023-12-08 DIAGNOSIS — G8929 Other chronic pain: Secondary | ICD-10-CM | POA: Diagnosis not present

## 2023-12-08 DIAGNOSIS — Z472 Encounter for removal of internal fixation device: Secondary | ICD-10-CM | POA: Diagnosis not present

## 2023-12-08 DIAGNOSIS — I951 Orthostatic hypotension: Secondary | ICD-10-CM | POA: Diagnosis not present

## 2023-12-08 DIAGNOSIS — D539 Nutritional anemia, unspecified: Secondary | ICD-10-CM | POA: Diagnosis present

## 2023-12-08 DIAGNOSIS — Z90710 Acquired absence of both cervix and uterus: Secondary | ICD-10-CM

## 2023-12-08 DIAGNOSIS — Z981 Arthrodesis status: Secondary | ICD-10-CM

## 2023-12-08 DIAGNOSIS — T8130XD Disruption of wound, unspecified, subsequent encounter: Secondary | ICD-10-CM

## 2023-12-08 DIAGNOSIS — Z880 Allergy status to penicillin: Secondary | ICD-10-CM

## 2023-12-08 DIAGNOSIS — R609 Edema, unspecified: Secondary | ICD-10-CM | POA: Diagnosis not present

## 2023-12-08 DIAGNOSIS — Z96619 Presence of unspecified artificial shoulder joint: Secondary | ICD-10-CM | POA: Diagnosis present

## 2023-12-08 DIAGNOSIS — S52021D Displaced fracture of olecranon process without intraarticular extension of right ulna, subsequent encounter for closed fracture with routine healing: Secondary | ICD-10-CM

## 2023-12-08 DIAGNOSIS — Z79899 Other long term (current) drug therapy: Secondary | ICD-10-CM

## 2023-12-08 DIAGNOSIS — N39498 Other specified urinary incontinence: Secondary | ICD-10-CM | POA: Diagnosis present

## 2023-12-08 DIAGNOSIS — S42401D Unspecified fracture of lower end of right humerus, subsequent encounter for fracture with routine healing: Secondary | ICD-10-CM

## 2023-12-08 DIAGNOSIS — Z8507 Personal history of malignant neoplasm of pancreas: Secondary | ICD-10-CM

## 2023-12-08 LAB — AEROBIC/ANAEROBIC CULTURE W GRAM STAIN (SURGICAL/DEEP WOUND)
Culture: NO GROWTH
Culture: NO GROWTH
Gram Stain: NONE SEEN

## 2023-12-08 LAB — BASIC METABOLIC PANEL WITH GFR
Anion gap: 12 (ref 5–15)
BUN: <5 mg/dL — ABNORMAL LOW (ref 8–23)
CO2: 23 mmol/L (ref 22–32)
Calcium: 8 mg/dL — ABNORMAL LOW (ref 8.9–10.3)
Chloride: 102 mmol/L (ref 98–111)
Creatinine, Ser: 0.68 mg/dL (ref 0.44–1.00)
GFR, Estimated: >60 mL/min (ref >60)
Glucose, Bld: 81 mg/dL (ref 70–99)
Potassium: 3.2 mmol/L — ABNORMAL LOW (ref 3.5–5.1)
Sodium: 137 mmol/L (ref 135–145)

## 2023-12-08 LAB — BPAM RBC
Blood Product Expiration Date: 202508092359
ISSUE DATE / TIME: 202507141817
Unit Type and Rh: 6200

## 2023-12-08 LAB — TYPE AND SCREEN
ABO/RH(D): A POS
Antibody Screen: NEGATIVE
Unit division: 0

## 2023-12-08 LAB — CBC
HCT: 31.2 % — ABNORMAL LOW (ref 36.0–46.0)
Hemoglobin: 10.5 g/dL — ABNORMAL LOW (ref 12.0–15.0)
MCH: 32 pg (ref 26.0–34.0)
MCHC: 33.7 g/dL (ref 30.0–36.0)
MCV: 95.1 fL (ref 80.0–100.0)
Platelets: 330 K/uL (ref 150–400)
RBC: 3.28 MIL/uL — ABNORMAL LOW (ref 3.87–5.11)
RDW: 19.4 % — ABNORMAL HIGH (ref 11.5–15.5)
WBC: 7.4 K/uL (ref 4.0–10.5)
nRBC: 0.5 % — ABNORMAL HIGH (ref 0.0–0.2)

## 2023-12-08 LAB — PHOSPHORUS: Phosphorus: 2.8 mg/dL (ref 2.5–4.6)

## 2023-12-08 LAB — MAGNESIUM: Magnesium: 1.8 mg/dL (ref 1.7–2.4)

## 2023-12-08 MED ORDER — CARVEDILOL 3.125 MG PO TABS
3.1250 mg | ORAL_TABLET | Freq: Two times a day (BID) | ORAL | Status: DC
  Administered 2023-12-08 – 2023-12-22 (×28): 3.125 mg via ORAL
  Filled 2023-12-08 (×31): qty 1

## 2023-12-08 MED ORDER — PROCHLORPERAZINE MALEATE 5 MG PO TABS
5.0000 mg | ORAL_TABLET | Freq: Four times a day (QID) | ORAL | Status: DC | PRN
  Administered 2023-12-10: 10 mg via ORAL
  Filled 2023-12-08: qty 2

## 2023-12-08 MED ORDER — FLUCONAZOLE 100 MG PO TABS
200.0000 mg | ORAL_TABLET | Freq: Once | ORAL | Status: AC
  Administered 2023-12-08: 200 mg via ORAL
  Filled 2023-12-08: qty 2

## 2023-12-08 MED ORDER — PROCHLORPERAZINE EDISYLATE 10 MG/2ML IJ SOLN: 5.0000 mg | Freq: Four times a day (QID) | INTRAMUSCULAR | Status: DC | PRN

## 2023-12-08 MED ORDER — CEFADROXIL 500 MG PO CAPS
500.0000 mg | ORAL_CAPSULE | Freq: Two times a day (BID) | ORAL | Status: AC
  Administered 2023-12-08 – 2023-12-15 (×14): 500 mg via ORAL
  Filled 2023-12-08 (×14): qty 1

## 2023-12-08 MED ORDER — PREDNISONE 5 MG PO TABS
5.0000 mg | ORAL_TABLET | Freq: Every day | ORAL | Status: DC
  Administered 2023-12-09 – 2023-12-23 (×15): 5 mg via ORAL
  Filled 2023-12-08 (×15): qty 1

## 2023-12-08 MED ORDER — FLUCONAZOLE 100 MG PO TABS
100.0000 mg | ORAL_TABLET | Freq: Every day | ORAL | Status: AC
  Administered 2023-12-09 – 2023-12-14 (×6): 100 mg via ORAL
  Filled 2023-12-08 (×6): qty 1

## 2023-12-08 MED ORDER — ZINC SULFATE 220 (50 ZN) MG PO CAPS
220.0000 mg | ORAL_CAPSULE | Freq: Every day | ORAL | Status: AC
  Administered 2023-12-09 – 2023-12-22 (×14): 220 mg via ORAL
  Filled 2023-12-08 (×14): qty 1

## 2023-12-08 MED ORDER — PANTOPRAZOLE SODIUM 40 MG PO TBEC
40.0000 mg | DELAYED_RELEASE_TABLET | Freq: Two times a day (BID) | ORAL | Status: DC
  Administered 2023-12-08 – 2023-12-23 (×30): 40 mg via ORAL
  Filled 2023-12-08 (×30): qty 1

## 2023-12-08 MED ORDER — ATORVASTATIN CALCIUM 10 MG PO TABS
10.0000 mg | ORAL_TABLET | Freq: Every day | ORAL | Status: DC
  Administered 2023-12-09 – 2023-12-10 (×2): 10 mg via ORAL
  Filled 2023-12-08 (×2): qty 1

## 2023-12-08 MED ORDER — VITAMIN D 25 MCG (1000 UNIT) PO TABS
1000.0000 [IU] | ORAL_TABLET | Freq: Every morning | ORAL | Status: DC
  Administered 2023-12-09 – 2023-12-23 (×15): 1000 [IU] via ORAL
  Filled 2023-12-08 (×15): qty 1

## 2023-12-08 MED ORDER — OXYCODONE HCL 5 MG PO TABS
5.0000 mg | ORAL_TABLET | ORAL | Status: DC | PRN
  Administered 2023-12-08 – 2023-12-22 (×27): 5 mg via ORAL
  Filled 2023-12-08 (×28): qty 1

## 2023-12-08 MED ORDER — ENSURE PLUS HIGH PROTEIN PO LIQD
237.0000 mL | Freq: Two times a day (BID) | ORAL | Status: DC
  Administered 2023-12-09 (×2): 237 mL via ORAL

## 2023-12-08 MED ORDER — CYCLOBENZAPRINE HCL 10 MG PO TABS
10.0000 mg | ORAL_TABLET | Freq: Every day | ORAL | Status: DC
  Administered 2023-12-08 – 2023-12-22 (×15): 10 mg via ORAL
  Filled 2023-12-08 (×15): qty 1

## 2023-12-08 MED ORDER — GERHARDT'S BUTT CREAM: TOPICAL_CREAM | Freq: Two times a day (BID) | CUTANEOUS | Status: DC | PRN

## 2023-12-08 MED ORDER — BISACODYL 10 MG RE SUPP: 10.0000 mg | Freq: Every day | RECTAL | Status: DC | PRN

## 2023-12-08 MED ORDER — DULOXETINE HCL 20 MG PO CPEP
20.0000 mg | ORAL_CAPSULE | Freq: Every day | ORAL | Status: DC
  Administered 2023-12-08 – 2023-12-22 (×15): 20 mg via ORAL
  Filled 2023-12-08 (×15): qty 1

## 2023-12-08 MED ORDER — ACETAMINOPHEN 325 MG PO TABS
650.0000 mg | ORAL_TABLET | Freq: Four times a day (QID) | ORAL | Status: DC | PRN
  Administered 2023-12-13 – 2023-12-21 (×3): 650 mg via ORAL
  Filled 2023-12-08 (×4): qty 2

## 2023-12-08 MED ORDER — CEFADROXIL 500 MG PO CAPS: 500.0000 mg | ORAL_CAPSULE | Freq: Two times a day (BID) | ORAL | Status: DC

## 2023-12-08 MED ORDER — BIOTIN 5000 MCG PO TABS: 10000.0000 ug | ORAL_TABLET | Freq: Every morning | ORAL | Status: DC

## 2023-12-08 MED ORDER — VITAMIN C 500 MG PO TABS
1000.0000 mg | ORAL_TABLET | Freq: Every day | ORAL | Status: DC
  Administered 2023-12-09 – 2023-12-23 (×15): 1000 mg via ORAL
  Filled 2023-12-08 (×15): qty 2

## 2023-12-08 MED ORDER — ASPIRIN 81 MG PO TBEC: 81.0000 mg | DELAYED_RELEASE_TABLET | Freq: Two times a day (BID) | ORAL | Status: DC

## 2023-12-08 MED ORDER — DIAZEPAM 5 MG PO TABS: 5.0000 mg | ORAL_TABLET | Freq: Every evening | ORAL | Status: DC | PRN

## 2023-12-08 MED ORDER — FLEET ENEMA RE ENEM
1.0000 | ENEMA | Freq: Once | RECTAL | Status: DC | PRN
Start: 2023-12-08 — End: 2023-12-22

## 2023-12-08 MED ORDER — GUAIFENESIN-DM 100-10 MG/5ML PO SYRP: 5.0000 mL | ORAL_SOLUTION | Freq: Four times a day (QID) | ORAL | Status: DC | PRN

## 2023-12-08 MED ORDER — METHOTREXATE SODIUM 2.5 MG PO TABS
12.5000 mg | ORAL_TABLET | ORAL | Status: DC
  Administered 2023-12-09 – 2023-12-23 (×3): 12.5 mg via ORAL
  Filled 2023-12-08 (×3): qty 5

## 2023-12-08 MED ORDER — PROCHLORPERAZINE 25 MG RE SUPP: 12.5000 mg | Freq: Four times a day (QID) | RECTAL | Status: DC | PRN

## 2023-12-08 MED ORDER — ALUM & MAG HYDROXIDE-SIMETH 200-200-20 MG/5ML PO SUSP: 30.0000 mL | ORAL | Status: DC | PRN

## 2023-12-08 MED ORDER — SENNA 8.6 MG PO TABS
1.0000 | ORAL_TABLET | Freq: Two times a day (BID) | ORAL | Status: DC
  Administered 2023-12-08 – 2023-12-23 (×29): 8.6 mg via ORAL
  Filled 2023-12-08 (×29): qty 1

## 2023-12-08 MED ORDER — DIPHENHYDRAMINE HCL 25 MG PO CAPS: 25.0000 mg | ORAL_CAPSULE | Freq: Four times a day (QID) | ORAL | Status: DC | PRN

## 2023-12-08 MED ORDER — OXYBUTYNIN CHLORIDE 5 MG PO TABS
5.0000 mg | ORAL_TABLET | Freq: Every day | ORAL | Status: DC
  Administered 2023-12-08 – 2023-12-10 (×3): 5 mg via ORAL
  Filled 2023-12-08 (×3): qty 1

## 2023-12-08 MED ORDER — TRAZODONE HCL 50 MG PO TABS
25.0000 mg | ORAL_TABLET | Freq: Every evening | ORAL | Status: DC | PRN
  Administered 2023-12-14 – 2023-12-22 (×4): 50 mg via ORAL
  Filled 2023-12-08 (×4): qty 1

## 2023-12-08 NOTE — Progress Notes (Signed)
 Inpatient Rehab Admissions Coordinator:  There is a bed available in CIR for pt today. Dr. Perri aware and in agreement. Pt, pt's husband and daughter, TOC and NSG made aware.    Tinnie Yvone Cohens, MS, CCC-SLP Admissions Coordinator [phone]

## 2023-12-08 NOTE — H&P (Signed)
 Physical Medicine and Rehabilitation Admission H&P    CC: Functional deficits due to Sherri hip replacement revision due to dislocation   HPI: Sherri [name] is an 83 year old R handed  female with PMHx significant for anxiety, dementia, GERD, rheumatoid arthritis on methotrexate  and prednisone , with osteoporosis, most likely; , HTN, multiple sclerosis, lipidemia, chronic pain, R olecranon fx (5/14), right femoral neck fx (6/17), right THA (6/19), dislocated THA (6/22), closed reduction right THA (6/23), and ORIF right olecranon (6/27).  Patient presented to [facility] on 11/26/2023 due to exposed surgical hardware and right elbow from previous elbow surgery 1 week prior. Orthopedics consulted.  X-ray showed fracture of right elbow with malposition screw and the patient underwent I&D with hardware removal, olecranon excision and triceps advancement on 11/28/2023 by Sherri [name].  Started on IV antibiotics for cellulitis, transitioned to doxy and Duricef for 4-6 weeks.  Labs in the ED showed hyponatremia 129 WBC 11.8 hemoglobin 8.1. Hemoglobin trended down to 6.8, and she received two units PRBC.   Additionally, the patient was found to have right lower extremity shortening, and an X-ray revealed a right hip dislocation. A closed hip reduction was attempted but was unsuccessful on 11/30/2023. Subsequently, the patient underwent a right total hip arthroplasty (THA) revision with head, ball, and liner change by Sherri [name] on 12/03/2023. Orthopedic surgery recommended aspirin  for 4 weeks. She will continue oral antibiotics for 4 to 6 weeks for cellulitis. Prior to arrival, the patient was living in a one-level home, able to live on the main level, using a walker. Currently requiring max assistance plus two for physical assistance, mod assist +2 for safety and equipment, using a rolling walker for ambulation. Therapy evaluations completed due to patient decreased functional mobility was admitted for a  comprehensive rehab program.     Pt reports LBM was today, however was like passing a truck.  Frustrated about urinary incontinence due to not strong enough to get to [facility] fast enough. Wants Purewick- explained can do at night, but not during day.   Has a painful MASD on buttocks.  Sherri [name] is very raw per daughter as well.    Pain is controlled better- and not taking IV pain meds now- spiked last night, but has been good today. Still 5-6/10 at all times. Is tolerable..   Last got pRBCs yesterday. Feeling better after transfusion. JP drain still draining bloody drainage per pt- is being emptied regularly.    Review of Systems  Constitutional:  Positive for malaise/fatigue and weight loss.  HENT:  Positive for hearing loss.   Eyes: Negative.   Respiratory: Negative.    Cardiovascular: Negative.   Gastrointestinal:  Positive for constipation. Negative for nausea and vomiting.  Genitourinary:  Positive for frequency and urgency.       Pt having incontinence and wants Purewick, due to not being able to get to [facility] on time  Musculoskeletal:  Positive for joint pain and myalgias.  Skin:  Positive for rash.       Severe MASD of buttocks  Neurological:  Positive for focal weakness and weakness.  Endo/Heme/Allergies: Negative.   Psychiatric/Behavioral:  Positive for memory loss. The patient is nervous/anxious.   All other systems reviewed and are negative.  Past Medical History:  Diagnosis Date   Anxiety    takes Valium  daily as needed   Basal cell carcinoma 01/21/1988   left nostril (MOHS), sup-right calf (CX35FU)   Basal cell carcinoma 03/22/1996   right calf (CX35FU)   Basal  cell carcinoma 03/31/1995   left wing nose   Bruises easily    d/t meds   Cancer (HCC)    basal cell ca, in situ- uterine    Cataracts, bilateral    removed bilateral   Chronic back pain    Dizziness    r/t to meds   GERD (gastroesophageal reflux disease)    no meds on a regular basis but will take  Tums if needed   Headache(784.0)    r/t neck issues   History of bronchitis 6-65yrs ago   History of colon polyps    benign   HOH (hard of hearing)    wears hearing aids   Hyperlipidemia    takes Atorvastatin  on Mondays and Fridays   Hypertension    Joint pain    Joint swelling    Multiple sclerosis (HCC)    Neuromuscular disorder (HCC)    Sherri. FABIENE [name]- [facility], follows M.S.   Nocturia    PONV (postoperative nausea and vomiting)    trouble urinating after surgery in 2014   Postoperative nausea and vomiting 01/11/2019   Rheumatoid arthritis (HCC)    Sherri [name] weekly, RA- hands- knees- feet    Rheumatoid arthritis(714.0)    Sherri [name] takes Xeljanz  daily   Right wrist fracture    Skin cancer    Squamous cell carcinoma of skin 11/02/2001   in situ-right knee (cx12fu)   Squamous cell carcinoma of skin 11/12/2011   in situ-left forearm (CX35FU), in situ-left foot (CX35FU)   Squamous cell carcinoma of skin 01/22/2012   in situ-left lower forearm (txpbx)   Squamous cell carcinoma of skin 11/17/2012   right shin (txpbx)   Squamous cell carcinoma of skin 10/28/2013   in situ-right shoulder (CX35FU)   Squamous cell carcinoma of skin 04/28/2014   well diff-right shin (txpbx)   Squamous cell carcinoma of skin 11/02/2014   in situ-Left shin (txpbx)   Squamous cell carcinoma of skin 12/15/2014   in situ-Left hand (txpbx), bowens-left side chest (txpbx)   Squamous cell carcinoma of skin 05/09/2015   in situ-left hand (txpbx)    Squamous cell carcinoma of skin 05/07/2016   in situ-left inner shin,ant, in situ-left inner shin, post, in situ-left outer forearm, in situ-right knuckle   Squamous cell carcinoma of skin 03/1302018   in situ-left shoulder (txpbx), in situ-right inner shin (txpbx), in situ-top of left foot (txpbx), KA- left forearm (txpbx)   Squamous cell carcinoma of skin 12/02/2016   in situ-left inner shin (txpbx)   Squamous cell carcinoma of skin  03/04/2017   in situ-left outer sup, shin (txpbx), in situ- right 2nd knuckle finger (txpbx), in situ- Left wrist (txpbx)   Squamous cell carcinoma of skin 04/06/2018   in situ-above left knee inner (txpbx)   Squamous cell carcinoma of skin 04/21/2018   in situ-right top hand (txpbx)   Squamous cell carcinoma of skin 07/08/2018   in situ-right lower inner shin (txpbx)   Squamous cell carcinoma of skin 04/27/2019   in situ- Left neck(CX35FU), In situ- right neck (CX35FU)   Urinary retention    sees SherriWrenn about 2 times a yr   Past Surgical History:  Procedure Laterality Date   ABDOMINAL HYSTERECTOMY     1985   ANTERIOR CERVICAL DECOMP/DISCECTOMY FUSION N/A 04/23/2022   Procedure: Anterior Cervical Decompression/Discectomy Fusion - Cervical Seven-Thoracic One;  Surgeon: Louis Shove, MD;  Location: [facility] OR;  Service: Neurosurgery;  Laterality: N/A;  3C   APPENDECTOMY  with TAH   BACK SURGERY     several   BREAST BIOPSY Right    Results were negative   BROW LIFT Bilateral 10/02/2016   Procedure: BILATERAL LOWER LID BLEPHAROPLASTY;  Surgeon: Laurie Loyd Redhead, MD;  Location: [facility] OR;  Service: Plastics;  Laterality: Bilateral;   CARPAL TUNNEL RELEASE Right    cataracts     CERVICAL FUSION     Sherri Leeann   CERVICAL FUSION  12/31/2012   Sherri Leeann   CERVICAL FUSION  04/2022   CHEST TUBE INSERTION     for traumatic Pneumothorax   COLONOSCOPY  07/16/2010   normal    COLONOSCOPY W/ POLYPECTOMY  1997   negative since; Sherri Debrah   ESOPHAGOGASTRODUODENOSCOPY  07/16/2010   normal   eye lid raise     EYE SURGERY Bilateral    cataracts removed - /w IOL   HARDWARE REMOVAL  08/2021   HIP CLOSED REDUCTION Right 11/16/2023   Procedure: CLOSED MANIPULATION, JOINT, HIP;  Surgeon: Ernie Cough, MD;  Location: [facility];  Service: Orthopedics;  Laterality: Right;   HIP CLOSED REDUCTION N/A 11/30/2023   Procedure: CLOSED REDUCTION, HIP;  Surgeon: Fidel Rogue, MD;  Location: [facility];  Service:  Orthopedics;  Laterality: N/A;   INCISION AND DRAINAGE OF WOUND Right 11/28/2023   Procedure: IRRIGATION AND DEBRIDEMENT WOUND; REMOVAL OF HARDWARE;  Surgeon: Edna Toribio LABOR, MD;  Location: [facility];  Service: Orthopedics;  Laterality: Right;  I & D RIGHT ELBOW   JOINT REPLACEMENT     LUMBAR FUSION     Sherri Leeann   NASAL SINUS SURGERY     ORIF ELBOW FRACTURE Right 11/20/2023   Procedure: OPEN REDUCTION INTERNAL FIXATION (ORIF) ELBOW/OLECRANON FRACTURE;  Surgeon: [name] Franky SQUIBB, MD;  Location: [facility];  Service: Orthopedics;  Laterality: Right;   POSTERIOR CERVICAL FUSION/FORAMINOTOMY N/A 12/31/2012   Procedure: POSTERIOR LATERAL CERVICAL FUSION/FORAMINOTOMY LEVEL 1 CERVICAL THREE-FOUR WITH LATERAL MASS SCREWS;  Surgeon: Catalina CHRISTELLA Leeann, MD;  Location: [facility];  Service: Neurosurgery;  Laterality: N/A;   PTOSIS REPAIR Bilateral 10/02/2016   Procedure: INTERNAL PTOSIS REPAIR;  Surgeon: Laurie Loyd Redhead, MD;  Location: [facility];  Service: Plastics;  Laterality: Bilateral;   SKIN BIOPSY     THYROID  SURGERY     R lobe removed- 1972, has grown back - CT last done- 2018   TOTAL HIP ARTHROPLASTY  12/22/2011   Procedure: TOTAL HIP ARTHROPLASTY;  Surgeon: Dempsey JINNY Sensor, MD;  Location: [facility];  Service: Orthopedics;  Laterality: Left;   TOTAL HIP ARTHROPLASTY Right 11/12/2023   Procedure: ARTHROPLASTY, HIP, TOTAL, ANTERIOR APPROACH;  Surgeon: Fidel Rogue, MD;  Location: [facility];  Service: Orthopedics;  Laterality: Right;   TOTAL HIP REVISION Right 12/03/2023   Procedure: TOTAL HIP REVISION;  Surgeon: Fidel Rogue, MD;  [facility]: [facility];  Service: Orthopedics;  Laterality: Right;  Right acetabular revision Anterior approach   TOTAL SHOULDER ARTHROPLASTY     TUBAL LIGATION     UPPER GASTROINTESTINAL ENDOSCOPY  2012   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   WRIST SURGERY Left 09/16/2022   wrist/arm   Family History  Problem Relation Age of Onset   Cancer Mother        pancreatic   Diabetes  Mother    Pancreatic cancer Mother    Heart disease Father        Rheumatic   Cancer Father        ? stomach   Stomach cancer  Father    Cancer Sister        stomach   Stomach cancer Sister    Cancer Maternal Aunt        X 4; ? primary   Heart disease Paternal Aunt    Cancer Maternal Grandmother        cervical   Colon cancer Other        Aunts   Multiple sclerosis Daughter    Hypertension Neg Hx    Stroke Neg Hx    Esophageal cancer Neg Hx    Rectal cancer Neg Hx    Social History:  reports that she quit smoking about 42 years ago. Her smoking use included cigarettes. She started smoking about 62 years ago. She has a 50 pack-year smoking history. She has never been exposed to tobacco smoke. She has never used smokeless tobacco. She reports current alcohol  use of about 14.0 standard drinks of alcohol  per week. She reports that she does not use drugs. Allergies:  Allergies  Allergen Reactions   Darvon [Propoxyphene Hcl] Shortness Of Breath    ? Dose Related ? Lowered respirations greatly   Demerol [Meperidine] Shortness Of Breath and Other (See Comments)    Respiratory Distress   Penicillins Hives and Other (See Comments)    Has patient had a PCN reaction causing immediate rash, facial/tongue/throat swelling, SOB or lightheadedness with hypotension: No SEVERE RASH INVOLVING MUCUS MEMBRANES or SKIN NECROSIS: #  #  #  YES  #  #  #  Has patient had a PCN reaction that required hospitalization No Has patient had a PCN reaction occurring within the last 10 years: No.    Imuran [Azathioprine] Other (See Comments)    Weakness   Sulfa  Antibiotics Nausea Only and Other (See Comments)    States had severe indigestion and heartburn and nausea and had to stop taking   Arava [Leflunomide] Other (See Comments)    Excessive weight gain   Lipitor [Atorvastatin ] Nausea And Vomiting    Currently taking 2 times weekly - able to tolerate low dose    Vytorin [Ezetimibe-Simvastatin] Nausea And  Vomiting   Medications Prior to Admission  Medication Sig Dispense Refill   acetaminophen  (TYLENOL ) 325 MG tablet Take 2 tablets (650 mg total) by mouth every 6 (six) hours as needed for mild pain (pain score 1-3) or fever (or Fever >/= 101). 20 tablet 0   aspirin  (ASPIRIN  CHILDRENS) 81 MG chewable tablet Chew 1 tablet (81 mg total) by mouth 2 (two) times daily with a meal. 90 tablet 0   atorvastatin  (LIPITOR) 10 MG tablet TAKE 1 TAB ON MONDAYS AND FRIDAY 26 tablet 4   Biotin  5000 MCG TABS Take 10,000 mcg by mouth in the morning.     Carboxymethylcellul-Glycerin  (LUBRICATING EYE DROPS OP) Place 1 drop into both eyes daily as needed (dry eyes).     carvedilol  (COREG ) 3.125 MG tablet Take 1 tablet (3.125 mg total) by mouth 2 (two) times daily with a meal. 180 tablet 3   cephALEXin  (KEFLEX ) 500 MG capsule Take 500 mg by mouth 3 (three) times daily. For 10 days     chlorhexidine  (HIBICLENS ) 4 % external liquid Apply 15 mLs (1 Application total) topically as directed for 30 doses. Use as directed daily for 5 days every other week for 6 weeks. 946 mL 1   cholecalciferol  (VITAMIN D ) 25 MCG (1000 UNIT) tablet Take 1,000 Units by mouth in the morning.     cyclobenzaprine  (FLEXERIL ) 10 MG tablet  TAKE 1 TABLET BY MOUTH THREE TIMES A DAY AS NEEDED FOR MUSCLE SPASM (Patient taking differently: Take 10 mg by mouth at bedtime.) 90 tablet 2   denosumab  (PROLIA ) 60 MG/ML SOSY injection Inject 60 mg into the skin every 6 (six) months. Deliver to rheum: [address], [place], [place] [address]. Appt on 07/23/2022 1 mL 0   diazepam  (VALIUM ) 5 MG tablet TAKE 1 TABLET BY MOUTH AT BEDTIME AS NEEDED FOR MUSCLE SPASMS. (Patient taking differently: Take 5 mg by mouth daily as needed for muscle spasms.) 30 tablet 5   docusate sodium  (COLACE) 100 MG capsule Take 1 capsule (100 mg total) by mouth daily. 10 capsule 0   DULoxetine  (CYMBALTA ) 20 MG capsule Take 20 mg by mouth at bedtime.     folic acid  (FOLVITE ) 1 MG  tablet Take 1 tablet (1 mg total) by mouth daily. 90 tablet 3   furosemide  (LASIX ) 20 MG tablet TAKE 1 TABLET BY MOUTH EVERY DAY 90 tablet 1   methotrexate  (RHEUMATREX) 2.5 MG tablet Take 5 tablets (12.5 mg total) by mouth once a week. Caution:Chemotherapy. Protect from light. 60 tablet 0   mupirocin  ointment (BACTROBAN ) 2 % Place 1 Application into the nose 2 (two) times daily for 60 doses. Use as directed 2 times daily for 5 days every other week for 6 weeks. 60 g 0   oxybutynin  (DITROPAN ) 5 MG tablet Take 5 mg by mouth daily.     pantoprazole  (PROTONIX ) 40 MG tablet Take 1 tablet (40 mg total) by mouth 2 (two) times daily before a meal. 60 tablet 5   polyethylene glycol (MIRALAX  / GLYCOLAX ) 17 g packet Take 17 g by mouth daily as needed for mild constipation. 14 each 0   predniSONE  (DELTASONE ) 5 MG tablet TAKE 1 TABLET BY MOUTH EVERY DAY IN THE MORNING 90 tablet 0   senna (SENOKOT) 8.6 MG tablet Take 1 tablet by mouth 2 (two) times daily.     traMADol  (ULTRAM ) 50 MG tablet Take 25-50 mg by mouth every 8 (eight) hours as needed for moderate pain (pain score 4-6).     bisacodyl  (DULCOLAX) 10 MG suppository Place 1 suppository (10 mg total) rectally daily as needed for moderate constipation. 12 suppository 0   diclofenac sodium (VOLTAREN) 1 % GEL APPLY 3 GRAMS TO 3 LARGE JOINTS 3 TIMES A DAY AS NEEDED (Patient not taking: Reported on 11/27/2023) 100 g 0   [Paused] Upadacitinib  ER ([name] ) 15 MG TB24 Take 15 mg by mouth daily. (Patient not taking: Reported on 11/27/2023)        Home:     Functional History:    Functional Status:  Mobility:          ADL:    Cognition:      Physical Exam: There were no vitals taken for this visit. Physical Exam Constitutional:      General: She is not in acute distress.    Appearance: She is ill-appearing.     Comments: Pt sitting up in bedside chair; husband and daughter who's retired [employer] fundraiser at bedside; NAD  HENT:     Head: Normocephalic.      Comments: Has a rash type bruising on L side of face consistent with the intubation for last surgery; skin appears pale    Right Ear: External ear normal.     Left Ear: External ear normal.     Ears:     Comments: HOH    Nose: Nose normal. No congestion.  Mouth/Throat:     Mouth: Mucous membranes are dry.     Pharynx: Oropharynx is clear. No oropharyngeal exudate.  Eyes:     General:        Right eye: No discharge.        Left eye: No discharge.     Extraocular Movements: Extraocular movements intact.  Cardiovascular:     Rate and Rhythm: Normal rate and regular rhythm.     Heart sounds: Normal heart sounds. No murmur heard.    No gallop.  Pulmonary:     Effort: Pulmonary effort is normal. No respiratory distress.     Breath sounds: Normal breath sounds. No wheezing, rhonchi or rales.  Abdominal:     Palpations: Abdomen is soft.     Comments: Slightly hypoactive; NT  Genitourinary:    Comments: Has purewick in place- medium amber urine Severely bruised/purple R labia Significant MASD and raw buttocks and genitals/perineum Musculoskeletal:     Cervical back: Neck supple.     Comments: RUE in large cast wrapped with ACE wrap R leg has drain with 20+ cc of sanguinous drainage in bulb and VAC also has drainage in it RUE- 4-4+/5 R deltoids and can wiggle fingers LUE 4+/5 throughout RLE- HF 2/5;  hard to bend knee, but at least 3/5;DF/PF 4/5 LLE- 5-/5 throughout  Skin:    General: Skin is warm and dry.     Comments: Raw oozing buttocks/perineum and significant MASD  Bruising in all 4 extremities- deep purple   Neurological:     Mental Status: She is alert.     Comments: Ox3, however decrease in memory But decent problem solving skills Intact in UEs and LLE sensation to light touch Decreased throughout RLE from R hip to R toes  Psychiatric:     Comments: Brighter affect, laughing some with daughter/husband     Results for orders placed or performed during the hospital  encounter of 11/26/23 (from the past 48 hours)  Basic metabolic panel with GFR     Status: Abnormal   Collection Time: 12/07/23  6:53 AM  Result Value Ref Range   Sodium 135 135 - 145 mmol/L   Potassium 3.7 3.5 - 5.1 mmol/L   Chloride 102 98 - 111 mmol/L   CO2 25 22 - 32 mmol/L   Glucose, Bld 84 70 - 99 mg/dL    Comment: Glucose reference range applies only to samples taken after fasting for at least 8 hours.   BUN <5 (L) 8 - 23 mg/dL   Creatinine, Ser 9.32 0.44 - 1.00 mg/dL   Calcium  8.1 (L) 8.9 - 10.3 mg/dL   GFR, Estimated >39 >39 mL/min    Comment: (NOTE) Calculated using the CKD-EPI Creatinine Equation (2021)    Anion gap 8 5 - 15    Comment: Performed at [facility], [address] [place], [place] [address]  CBC     Status: Abnormal   Collection Time: 12/07/23  6:53 AM  Result Value Ref Range   WBC 7.3 4.0 - 10.5 K/uL   RBC 2.43 (L) 3.87 - 5.11 MIL/uL   Hemoglobin 7.9 (L) 12.0 - 15.0 g/dL   HCT 75.5 (L) 63.9 - 53.9 %   MCV 100.4 (H) 80.0 - 100.0 fL   MCH 32.5 26.0 - 34.0 pg   MCHC 32.4 30.0 - 36.0 g/dL   RDW 81.3 (H) 88.4 - 84.4 %   Platelets 292 150 - 400 K/uL   nRBC 0.6 (H) 0.0 -  0.2 %    Comment: Performed at [facility], [address], [place], [place] [address]  Hemoglobin and hematocrit, blood     Status: Abnormal   Collection Time: 12/07/23 12:46 PM  Result Value Ref Range   Hemoglobin 7.5 (L) 12.0 - 15.0 g/dL   HCT 77.2 (L) 63.9 - 53.9 %    Comment: Performed at [facility], [address] [place], [place] [address]  Prepare RBC (crossmatch)     Status: None   Collection Time: 12/07/23  4:30 PM  Result Value Ref Range   Order Confirmation      ORDER PROCESSED BY BLOOD BANK Performed at [facility], [address], [place], [place] [address]   CBC     Status: Abnormal   Collection Time: 12/08/23  9:05 AM  Result Value Ref Range   WBC 7.4 4.0 - 10.5 K/uL   RBC 3.28 (L) 3.87 - 5.11 MIL/uL   Hemoglobin 10.5 (L) 12.0 - 15.0  g/dL    Comment: REPEATED TO VERIFY POST TRANSFUSION SPECIMEN    HCT 31.2 (L) 36.0 - 46.0 %   MCV 95.1 80.0 - 100.0 fL   MCH 32.0 26.0 - 34.0 pg   MCHC 33.7 30.0 - 36.0 g/dL   RDW 80.5 (H) 88.4 - 84.4 %   Platelets 330 150 - 400 K/uL    Comment: REPEATED TO VERIFY   nRBC 0.5 (H) 0.0 - 0.2 %    Comment: Performed at [facility], [address] [place], [place] [address]  Magnesium      Status: None   Collection Time: 12/08/23  9:05 AM  Result Value Ref Range   Magnesium  1.8 1.7 - 2.4 mg/dL    Comment: Performed at [facility], [address], [place], [place] [address]  Phosphorus     Status: None   Collection Time: 12/08/23  9:05 AM  Result Value Ref Range   Phosphorus 2.8 2.5 - 4.6 mg/dL    Comment: Performed at [facility], [address], [place], [place] [address]  Basic metabolic panel with GFR     Status: Abnormal   Collection Time: 12/08/23  9:05 AM  Result Value Ref Range   Sodium 137 135 - 145 mmol/L   Potassium 3.2 (L) 3.5 - 5.1 mmol/L   Chloride 102 98 - 111 mmol/L   CO2 23 22 - 32 mmol/L   Glucose, Bld 81 70 - 99 mg/dL    Comment: Glucose reference range applies only to samples taken after fasting for at least 8 hours.   BUN <5 (L) 8 - 23 mg/dL   Creatinine, Ser 9.31 0.44 - 1.00 mg/dL   Calcium  8.0 (L) 8.9 - 10.3 mg/dL   GFR, Estimated >39 >39 mL/min    Comment: (NOTE) Calculated using the CKD-EPI Creatinine Equation (2021)    Anion gap 12 5 - 15    Comment: Performed at [facility], [address] [place], [place] [address]   *Note: Due to a large number of results and/or encounters for the requested time period, some results have not been displayed. A complete set of results can be found in Results Review.   No results found.    There were no vitals taken for this visit.  Medical Problem List and Plan: 1. Functional deficits secondary to R 2nd hip dislocation s/p R total hip revision surgery WBAT on RLE  -patient may not  shower due to [facility]  -ELOS/Goals: 12-14 days- Superviison to min A WBAT with walker RLE-posterior hip precautions Partial weightbearing RUE okay to use walker per Sherri. Edna- s/p R elbow hardware removal  2.  Antithrombotics: -DVT/anticoagulation:  Mechanical:  Antiembolism stockings, knee (TED hose) Bilateral lower extremities Sequential compression devices, below knee Bilateral lower extremities  -antiplatelet therapy: Aspirin  81 mg held due to bleeding risk  3. Pain Management: Oxycodone  and Tylenol  as needed  - Continue Cymbalta  20 mg daily and Flexeril  10 mg at bedtime 4. Mood/Behavior/Sleep: Therapy evaluations completed due to patient decreased functional mobility was admitted for a comprehensive rehab program.   -antipsychotic agents: N/A 5. Neuropsych/cognition: This patient is intermittently capable of making decisions on her own behalf. 6. Skin/Wound Care: Routine pressure relief measures  -JP drain-per Ortho will discontinue when output < 10 cc per shift  -Continue negative pressure at 75 mmHg--can be exchanged to Prevena  - MASD-7/15 start Diflucan  200 mg then 100 mg x 6 days -Gerhardt's   -Diffuse ecchymoses- Platelets 330 CTM Routine labs, follow-up with derm/hematology outpatient. 7. Fluids/Electrolytes/Nutrition: Monitor I's and O's, recheck labs tomorrow  -Diet Heart Healthy-- SLP recc nectar thick- pt not agreeable  -Ensure High protein + Zinc  and vitamin C  -constipation-Colace -Overactive Bladder- cont Oxybutynin   8. Hypertension: Continue Coreg  3.125 mg  -monitor blood pressures per protocol  9. Rheumatoid arthritis-continue home prednisone  5 mg daily and methotrexate - methotrexate  just restarted.  10. Microcytic anemia/acute blood loss anemia: monitor for signs of bleeding recheck CBC in a.m   -normal folate & B12, iron study suggestive of AOC, status post 3 units PRBC. 11. Severe MASD_ will try Diflucan  200 mg x1 and 100 mg daily x 6 days and monitor skin 12.  Constipation- needs colace added 13. IV doesn't look great- will remove     Brandi L Leak, NP 12/08/2023   I have personally performed a face to face diagnostic evaluation of this patient and formulated the key components of the plan.  Additionally, I have personally reviewed laboratory data, imaging studies, as well as relevant notes and concur with the physician assistant's documentation above.   The patient's status has not changed from the original H&P.  Any changes in documentation from the acute care chart have been noted above.

## 2023-12-08 NOTE — Progress Notes (Signed)
 Signed     Expand All Collapse All PMR Admission Coordinator Pre-Admission Assessment   Patient: Sherri [name] is an 83 y.o., female MRN: [id number] DOB: 1940-12-16 Height: 5' 0.98 (154.9 cm) Weight: 48.6 kg   Insurance Information HMO: yes    PPO:      PCP:      IPA:      80/20:      OTHER:  PRIMARY: U[employer] Medicare      Policy#: [id number]      Subscriber: patient CM Name:       Phone#:      Fax#: [phone] Pre-Cert#: [id number] Received approval on 12/08/23 after peer-to-peer. Anne with [facility] verified approval. Pt approved from 12/08/23-12/14/23.     Employer:  Benefits:  Phone #: [phone]     Name:  Eff. Date: 05/27/23-05/25/24     Deduct: $o (does not have deductible)      Out of Pocket Max: $3,900 ($1,143.58 met)      Life Max: NA CIR: $300/day co-pay for days 1-5, 100% coverage for days 6+      SNF: 100% coverage for days 1-20, $203 co-pay/day for days 21-100 Outpatient: $20 co-pay/visit     Co-Pay:  Home Health: 100% coverage      Co-Pay:  DME: 80% coverage     Co-Pay: 20% co-insurance Providers: in-network SECONDARY:       Policy#:      Phone#:    Artist:       Phone#:    The Data processing manager" for patients in Inpatient Rehabilitation Facilities with attached "Privacy Act Statement-Health Care Records" was provided and verbally reviewed with: Patient   Emergency Contact Information Contact Information       Name Relation Home Work [place] Daughter [phone]   [phone]         Other Contacts       Name Relation Home Work Mobile    Santo,Dale Spouse [phone]               Current Medical History  Patient Admitting Diagnosis: 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 Pt presented to [facility] on 11/26/23 d/t exposed surgical hardware from previous elbow surgery 1 week prior. Pt also noted to have closed fracture of right hip; binder in place from previous repair. Orthopedics consulted. X-ray showed fracture of right elbow with malposition screw. Pt underwent I&D with hardware removal, olecranon excision and triceps advancement on 11/28/23 by Dr. Edna. Hgb 6.8 on 7/6; transfused 2 units PRBC. Noted right LE shortened on 7/7. X-ray showed right hip dislocation. Pt taken to OR for closed hip reduction by Dr. Fidel on 7/7; not successful. Pt underwent right THA revision with head ball and liner exchange by Dr. Fidel on 12/03/23. Therapy evaluations completed and CIR recommended d/t pt's deficits in functional mobility.    Patient[facility] from [facility] has been reviewed by the rehabilitation admission coordinator and physician.   Past Medical History      Past Medical History:  Diagnosis Date   Anxiety      takes Valium  daily as needed   Basal cell carcinoma  01/21/1988    left nostril (MOHS), sup-right calf (CX35FU)   Basal cell carcinoma 03/22/1996    right calf (CX35FU)   Basal cell carcinoma 03/31/1995    left wing nose   Bruises easily      d/t meds   Cancer (HCC)      basal cell ca, in situ- uterine    Cataracts, bilateral      removed bilateral   Chronic back pain     Dizziness      r/t to meds   GERD (gastroesophageal reflux disease)      no meds on a regular basis but will take Tums if needed   Headache(784.0)      r/t neck issues   History of bronchitis 6-7yrs ago   History of colon polyps      benign   HOH (hard of hearing)      wears hearing aids   Hyperlipidemia      takes Atorvastatin  on Mondays and Fridays   Hypertension     Joint pain      Joint swelling     Multiple sclerosis (HCC)     Neuromuscular disorder (HCC)      Dr. FABIENE Crete- [facility], follows M.S.   Nocturia     PONV (postoperative nausea and vomiting)      trouble urinating after surgery in 2014   Postoperative nausea and vomiting 01/11/2019   Rheumatoid arthritis (HCC)      Dr Lorrayne weekly, RA- hands- knees- feet    Rheumatoid arthritis(714.0)      Dr Dolphus takes Xeljanz  daily   Right wrist fracture     Skin cancer     Squamous cell carcinoma of skin 11/02/2001    in situ-right knee (cx42fu)   Squamous cell carcinoma of skin 11/12/2011    in situ-left forearm (CX35FU), in situ-left foot (CX35FU)   Squamous cell carcinoma of skin 01/22/2012    in situ-left lower forearm (txpbx)   Squamous cell carcinoma of skin 11/17/2012    right shin (txpbx)   Squamous cell carcinoma of skin 10/28/2013    in situ-right shoulder (CX35FU)   Squamous cell carcinoma of skin 04/28/2014    well diff-right shin (txpbx)   Squamous cell carcinoma of skin 11/02/2014    in situ-Left shin (txpbx)   Squamous cell carcinoma of skin 12/15/2014    in situ-Left hand (txpbx), bowens-left side chest (txpbx)   Squamous cell carcinoma of skin 05/09/2015    in situ-left hand (txpbx)    Squamous cell carcinoma of skin 05/07/2016    in situ-left inner shin,ant, in situ-left inner shin, post, in situ-left outer forearm, in situ-right knuckle   Squamous cell carcinoma of skin 03/1302018    in situ-left shoulder (txpbx), in situ-right inner shin (txpbx), in situ-top of left foot (txpbx), KA- left forearm (txpbx)   Squamous cell carcinoma of skin 12/02/2016    in situ-left inner shin (txpbx)   Squamous cell carcinoma of skin 03/04/2017    in situ-left outer sup, shin (txpbx), in situ- right 2nd knuckle finger (txpbx), in situ- Left wrist (txpbx)   Squamous cell carcinoma of skin 04/06/2018    in situ-above left knee inner (txpbx)   Squamous cell carcinoma of skin  04/21/2018    in situ-right top hand (txpbx)   Squamous cell carcinoma of skin 07/08/2018    in situ-right lower inner shin (txpbx)   Squamous cell carcinoma of skin 04/27/2019    in situ- Left neck(CX35FU),  In situ- right neck (CX35FU)   Urinary retention      sees Dr.Wrenn about 2 times a yr          Has the patient had major surgery during 100 days prior to admission? Yes   Family History   family history includes Cancer in her father, maternal aunt, maternal grandmother, mother, and sister; Colon cancer in an other family member; Diabetes in her mother; Heart disease in her father and paternal aunt; Multiple sclerosis in her daughter; Pancreatic cancer in her mother; Stomach cancer in her father and sister.   Current Medications  Current Medications    Current Facility-Administered Medications:    0.9 %  sodium chloride  infusion, , Intravenous, Continuous, Leigh Valery GORMAN DEVONNA, Last Rate: 75 mL/hr at 12/04/23 0727, New Bag at 12/04/23 9272   atorvastatin  (LIPITOR) tablet 10 mg, 10 mg, Oral, Daily, Swinteck, Redell, MD, 10 mg at 12/08/23 1018   bisacodyl  (DULCOLAX) suppository 10 mg, 10 mg, Rectal, Daily PRN, Fidel Redell, MD   carvedilol  (COREG ) tablet 3.125 mg, 3.125 mg, Oral, BID WC, Swinteck, Redell, MD, 3.125 mg at 12/08/23 1018   cefadroxil  (DURICEF) capsule 500 mg, 500 mg, Oral, BID, Perri DELENA Meliton Mickey., MD, 500 mg at 12/08/23 1018   Chlorhexidine  Gluconate Cloth 2 % PADS 6 each, 6 each, Topical, Daily, Swinteck, Redell, MD, 6 each at 12/08/23 1020   cyclobenzaprine  (FLEXERIL ) tablet 10 mg, 10 mg, Oral, QHS, Swinteck, Redell, MD, 10 mg at 12/07/23 2108   diphenhydrAMINE  (BENADRYL ) 12.5 MG/5ML elixir 12.5-25 mg, 12.5-25 mg, Oral, Q4H PRN, Hill, Avery S, PA-C, 25 mg at 12/04/23 2111   docusate sodium  (COLACE) capsule 100 mg, 100 mg, Oral, Daily, Swinteck, Redell, MD, 100 mg at 12/08/23 1018   DULoxetine  (CYMBALTA ) DR capsule 20 mg, 20 mg, Oral, QHS, Swinteck, Redell, MD, 20 mg  at 12/07/23 2223   feeding supplement (ENSURE PLUS HIGH PROTEIN) liquid 237 mL, 237 mL, Oral, BID BM, Hill, Avery S, PA-C, 237 mL at 12/04/23 9173   folic acid  (FOLVITE ) tablet 1 mg, 1 mg, Oral, Daily, Swinteck, Redell, MD, 1 mg at 12/08/23 1018   HYDROcodone -acetaminophen  (NORCO/VICODIN) 5-325 MG per tablet 1-2 tablet, 1-2 tablet, Oral, Q4H PRN, Fidel Redell, MD, 2 tablet at 12/08/23 0315   menthol -cetylpyridinium (CEPACOL) lozenge 3 mg, 1 lozenge, Oral, PRN, 3 mg at 12/05/23 2019 **OR** phenol (CHLORASEPTIC) mouth spray 1 spray, 1 spray, Mouth/Throat, PRN, Hill, Avery S, PA-C   metoCLOPramide  (REGLAN ) tablet 5-10 mg, 5-10 mg, Oral, Q8H PRN **OR** metoCLOPramide  (REGLAN ) injection 5-10 mg, 5-10 mg, Intravenous, Q8H PRN, Hill, Avery S, PA-C   morphine  (PF) 2 MG/ML injection 2 mg, 2 mg, Intravenous, Q3H PRN, Fidel Redell, MD, 2 mg at 12/07/23 1606   ondansetron  (ZOFRAN ) tablet 4 mg, 4 mg, Oral, Q6H PRN **OR** ondansetron  (ZOFRAN ) injection 4 mg, 4 mg, Intravenous, Q6H PRN, Hill, Avery S, PA-C, 4 mg at 12/07/23 1100   Oral care mouth rinse, 15 mL, Mouth Rinse, 4 times per day, Perri DELENA Meliton Mickey., MD, 15 mL at 12/08/23 1241   Oral care mouth rinse, 15 mL, Mouth Rinse, PRN, Perri DELENA Meliton Mickey., MD   pantoprazole  (PROTONIX ) EC tablet 40 mg, 40 mg, Oral, BID AC, Swinteck, Brian, MD, 40 mg at 12/08/23 1018   polyethylene glycol (MIRALAX  / GLYCOLAX ) packet 17 g, 17 g, Oral, Daily PRN, Fidel Redell, MD, 17 g at 12/06/23 0756   polyethylene glycol (MIRALAX  / GLYCOLAX ) packet 17 g, 17 g, Oral, BID, Perri, A  Meliton Raddle., MD, 17 g at 12/08/23 1005   predniSONE  (DELTASONE ) tablet 5 mg, 5 mg, Oral, Q breakfast, Swinteck, Redell, MD, 5 mg at 12/08/23 1018   senna (SENOKOT) tablet 8.6 mg, 1 tablet, Oral, BID, Swinteck, Redell, MD, 8.6 mg at 12/08/23 1018     Patients Current Diet:  Diet Order                  Diet - low sodium heart healthy             Diet regular Room service appropriate? Yes  with Assist; Fluid consistency: Thin  Diet effective now                         Precautions / Restrictions Precautions Precautions: Fall, Posterior Hip Precaution Booklet Issued: No Precaution/Restrictions Comments: R JP drain, RLE wound vac Restrictions Weight Bearing Restrictions Per Provider Order: No RUE Weight Bearing Per Provider Order: Partial weight bearing RUE Partial Weight Bearing Percentage or Pounds: okay to use walker RLE Weight Bearing Per Provider Order: Weight bearing as tolerated    Has the patient had 2 or more falls or a fall with injury in the past year? Yes   Prior Activity Level Community (5-7x/wk): drives, gets out of house ~3 days/week   Prior Functional Level Self Care: Did the patient need help bathing, dressing, using the toilet or eating? Needed some help   Indoor Mobility: Did the patient need assistance with walking from room to room (with or without device)? Unknown (pt has been NWB recently)   Stairs: Did the patient need assistance with internal or external stairs (with or without device)? Independent   Functional Cognition: Did the patient need help planning regular tasks such as shopping or remembering to take medications? Needed some help   Patient Information Are you of Hispanic, Latino/a,or Spanish origin?: A. No, not of Hispanic, Latino/a, or Spanish origin What is your race?: A. White Do you need or want an interpreter to communicate with a doctor or health care staff?: 0. No   Patient's Response To:  Health Literacy and Transportation Is the patient able to respond to health literacy and transportation needs?: Yes Health Literacy - How often do you need to have someone help you when you read instructions, pamphlets, or other written material from your doctor or pharmacy?: Never In the past 12 months, has lack of transportation kept you from medical appointments or from getting medications?: No In the past 12 months, has lack of  transportation kept you from meetings, work, or from getting things needed for daily living?: No   Journalist, newspaper / Equipment Home Equipment: Occupational hygienist (4 wheels), BSC/3in1, Shower seat - built in, [employer] - tub/shower, Wheelchair - manual, Agricultural consultant (2 wheels), Other (comment) (adjustable bed)   Prior Device Use: Indicate devices/aids used by the patient prior to current illness, exacerbation or injury? Manual wheelchair   Current Functional Level Cognition   Orientation Level: Oriented X4    Extremity Assessment (includes Sensation/Coordination)   Upper Extremity Assessment: RUE deficits/detail RUE Deficits / Details: RUE with splint from wrist to elbow, can oppose digits and make a fist within limits of splint, reports mild sensation changes RUE Sensation: decreased light touch, decreased proprioception RUE Coordination: decreased fine motor, decreased gross motor  Lower Extremity Assessment: Defer to PT evaluation RLE Deficits / Details: Pt demonstrated WFL ankle ROM ad limited hip/knee ROM. Grossly 3-/5 strength. RLE: Unable to fully assess  due to pain RLE Sensation: WNL RLE Coordination: decreased gross motor     ADLs   Overall ADL's : Needs assistance/impaired Eating/Feeding: Sitting, Set up Grooming: Sitting, Brushing hair, Oral care, Wash/dry face Upper Body Bathing: Moderate assistance, Sitting Lower Body Bathing: Maximal assistance, Sitting/lateral leans, Sit to/from stand Upper Body Dressing : Moderate assistance, Sitting Lower Body Dressing: Maximal assistance, Sitting/lateral leans, Sit to/from stand Toilet Transfer: Maximal assistance, +2 for physical assistance Toileting- Clothing Manipulation and Hygiene: Maximal assistance Functional mobility during ADLs: Maximal assistance, +2 for physical assistance     Mobility   Overal bed mobility: Needs Assistance Bed Mobility: Supine to Sit Supine to sit: Mod assist General bed mobility comments: pt up in  chair on arrival and at end of session     Transfers   Overall transfer level: Needs assistance Equipment used: Rolling walker (2 wheels) Transfers: Sit to/from Stand, Bed to chair/wheelchair/BSC Sit to Stand: Min assist Bed to/from chair/wheelchair/BSC transfer type:: Squat pivot Stand pivot transfers: From elevated surface, Mod assist, +2 physical assistance Squat pivot transfers: Max assist, +2 physical assistance General transfer comment: Pt stood from recliner. Cues for proper hand placement using RW.     Ambulation / Gait / Stairs / Wheelchair Mobility   Ambulation/Gait Ambulation/Gait assistance: Mod assist, +2 physical assistance, +2 safety/equipment Gait Distance (Feet): 3 Feet (x2) Assistive device: Rolling walker (2 wheels) Gait Pattern/deviations: Step-to pattern, Decreased stride length, Decreased dorsiflexion - right, Decreased weight shift to right, Knees buckling, Antalgic General Gait Details: pt taking short steps with RW needing cues for sequencing each step, pt fearful of falling with knees buckling x1 with mod A to maintain balance, chair follow for safety     Posture / Balance Dynamic Sitting Balance Sitting balance - Comments: Pt sat EOB with supervision-CGA. Balance Overall balance assessment: Needs assistance, History of Falls Sitting-balance support: Feet supported, No upper extremity supported Sitting balance-Leahy Scale: Fair Sitting balance - Comments: Pt sat EOB with supervision-CGA. Standing balance support: Bilateral upper extremity supported, During functional activity, Reliant on assistive device for balance Standing balance-Leahy Scale: Poor Standing balance comment: reliant on external support     Special needs/care consideration Continuous Drip IV  0.9% sodium chloride  infusion, Skin Abrasion: arm, leg, thigh/bilateral; Blister: buttocks/upper; Ecchymosis: arm,leg/bilateral; Erythema/Redness: arm, buttocks/bilateral; Scratch marks; back,  buttocks/right; Weeping: elbow/right; Wound- elbow/anterior, right, upper; Surgical Incision: hip/right; Pressure injury: coccyx/mid; Skin Tear: arm/anterior, left, lower, External Urinary Catheter and JP drain/right, anterior    Previous Home Environment (from acute therapy documentation) Living Arrangements: Spouse/significant other  Lives With: Spouse Available Help at Discharge: Family, Available PRN/intermittently, Available 24 hours/day, Personal care attendant (Husband couldn't physically assist pt but could help with houshold management; Son leaves nearby; Daughter reports 24/7 aides) Type of Home: House Home Layout: Able to live on main level with bedroom/bathroom Home Access: Ramped entrance Bathroom Shower/Tub: Walk-in shower, Nurse, adult Toilet: Handicapped height Bathroom Accessibility: Yes How Accessible: Accessible via walker Home Care Services: Yes Type of Home Care Services: Home PT, Home RN (OT was supposed to begin services, but pt was admitted to hospital. Also has 24/7 caregivers.) Home Care Agency (if known): [facility] Additional Comments: Per dtr, 24/7 caregivers hired or family.  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 for Discharge Living Setting: Patient's home Type of Home at Discharge: House Discharge Home Layout: Able to live on main level with bedroom/bathroom Discharge Home Access: Ramped entrance Discharge Bathroom Shower/Tub: Walk-in shower Discharge Bathroom Toilet: Handicapped height Discharge  Bathroom Accessibility: Yes How Accessible: Accessible via walker Does the patient have any problems obtaining your medications?: No   Social/Family/Support Systems Anticipated Caregiver: Cheryl (husband), family, hired caregivers Anticipated Caregiver's Contact Information: Cheryl: [phone], Suzen (daughter): [phone] Ability/Limitations of Caregiver: Husband can provide superision only Caregiver Availability: 24/7 Discharge  Plan Discussed with Primary Caregiver: Yes Is Caregiver In Agreement with Plan?: Yes Does Caregiver/Family have Issues with Lodging/Transportation while Pt is in Rehab?: No   Goals Patient/Family Goal for Rehab: Supervision-Min A:PT/OT, Supervision: ST Expected length of stay: 12-14 days Pt/Family Agrees to Admission and willing to participate: Yes Program Orientation Provided & Reviewed with Pt/Caregiver Including Roles  & Responsibilities: Yes   Decrease burden of Care through IP rehab admission: NA   Possible need for SNF placement upon discharge: Not anticipated   Patient Condition: This patient's condition remains as documented in the consult dated 12/06/23, in which the Rehabilitation Physician determined and documented that the patient's condition is appropriate for intensive rehabilitative care in an inpatient rehabilitation facility. Will admit to inpatient rehab today.   Preadmission Screen Completed By:  Tinnie SHAUNNA Yvone Delayne, 12/08/2023 1:42 PM ______________________________________________________________________   Discussed status with Dr. Cornelio on 12/08/23  at 1:42 PM and received approval for admission today.   Admission Coordinator:  Tinnie SHAUNNA Yvone Delayne, CCC-SLP, time 1:42 PM/Date 12/08/23   Diagnosis:  L total hip revision due to multiple dislocations- WBAT with posterio hip precautions and partial WB on RUE_ but can use platform walker Does the need for close, 24 hr/day medical supervision in concert with the patient's rehab needs make it unreasonable for this patient to be served in a less intensive setting? Yes Co-Morbidities requiring supervision/potential complications: RA, MS;  HOH, significant anxiety; frequent falls- R elbow/olecranon fx s/p ORIF and hardware removal due to it showing- dysphagia- refusing nectar thick liquids; on PO ABX for 4-6 weeks due to Elbow wound; has WOUND VAC- on R hip;  Due to bladder management, bowel management, safety, skin/wound  care, disease management, medication administration, pain management, and patient education, does the patient require 24 hr/day rehab nursing? Yes Does the patient require coordinated care of a physician, rehab nurse, therapy disciplines of PT, OT and SLP for swallowing to address physical and functional deficits in the context of the above medical diagnosis(es)? Yes Addressing deficits in the following areas: balance, endurance, locomotion, strength, transferring, bowel/bladder control, bathing, dressing, feeding, grooming, toileting, and swallowing Can the patient actively participate in an intensive therapy program of at least 3 hrs of therapy per day at least 5 days per week? Yes The potential for patient to make measurable gains while on inpatient rehab is fair Anticipated functional outcomes upon discharge from inpatient rehab are supervision and min assist  with PT, supervision and min assist with OT, supervision with SLP. Estimated rehab length of stay to reach the above functional goals is: ~ 2 weeks Anticipated discharge destination: Home Overall Rehab/Functional Prognosis: good and fair     MD Signature:

## 2023-12-08 NOTE — Plan of Care (Signed)
  Problem: Clinical Measurements: Goal: Ability to maintain clinical measurements within normal limits will improve Outcome: Progressing Goal: Will remain free from infection Outcome: Progressing Goal: Diagnostic test results will improve Outcome: Progressing Goal: Respiratory complications will improve Outcome: Progressing Goal: Cardiovascular complication will be avoided Outcome: Progressing   Problem: Activity: Goal: Risk for activity intolerance will decrease Outcome: Progressing   Problem: Nutrition: Goal: Adequate nutrition will be maintained Outcome: Progressing   Problem: Coping: Goal: Level of anxiety will decrease Outcome: Progressing   Problem: Elimination: Goal: Will not experience complications related to bowel motility Outcome: Progressing   Problem: Pain Managment: Goal: General experience of comfort will improve and/or be controlled Outcome: Progressing

## 2023-12-08 NOTE — Discharge Summary (Signed)
 Physician Discharge Summary  CHARLEENE CALLEGARI FMW:[id number] DOB: [birth year] DOA: 11/26/2023  PCP: [name] PARAS, MD  Admit date: 11/26/2023 Discharge date: 12/08/2023  Time spent: 40 minutes  Recommendations for Outpatient Follow-up:  Follow outpatient CBC/CMP  Follow with orthopedics outpatient  Post op wound care per orthopedics  Requesting WOC consult for her skin tears, will defer to CIR She's bruised all over, not on antiplatelets, normal platelets - seems more significant than I'd expect -would monitor outpatient SLP recommending nectar thick liquids, she's not willing to modify her diet  Discharge Diagnoses:  Principal Problem:   Wound dehiscence Active Problems:   Hyponatremia   Hyperlipidemia   Multiple sclerosis (HCC)   Essential hypertension   Rheumatoid arthritis (HCC)   Macrocytic anemia   Cellulitis of right elbow   Discharge Condition: stable  Diet recommendation: heart healthy  Filed Weights   11/26/23 2012 11/28/23 1238 12/03/23 1244  Weight: 48.6 kg 48.6 kg 48.6 kg    History of present illness:   Neta  Khylei Wilms Skarda is an 83 y.o. 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   She's s/p I&D of right elbow with hardware removal as well as revision of R THA.    Stable for discharge to CIR.   Hospital Course:  Assessment and Plan:   Right elbow visible hardware with Wound dehiscence with cellulitis: Orthopedic surgery to recommend further antibiotics. Status post I&D of the right elbow on 11/27/2023 and hardware removal. Blood cx NGTD x5  Based on my discussion with Dr. Edna, he's ok with 2 weeks abx and  narrowing to basic skin flora coverage (his note from 7/6 mentioned keflex  - will use duricef for ease of dosing). Orthopedic surgery recommended aspirin  for 4 weeks. Follow-up with orthopedic surgery in 2 weeks. PT eval is pending.   Superior Dislocation of R hip Arthroplasty S/p closed reduction on 7/7 S/p revision of R THA, head ball and liner exchange Pending intra-op cultures NG JP drain to d/c when output less than 10 cc per shift, negative pressure dressing per ortho - on discharge    Macrocytic anemia/acute blood loss anemia Symptomatic Anemia Normal B12, folate Iron studies suggestive of AOC S/p 3 unit pRBC   Urinary Urgency UA with large LE, WBC's Will monitor - consider culture if additional symptoms  Dysphagia SLP recommending nectar thick liquids, she's not willing to do this   Hypovolemic hyponatremia: trend   Hypokalemia: improved   Rheumatoid arthritis: Continue methotrexate  and prednisone .   History of multiple sclerosis: She is no longer on these medication.   Chronic pain: Continue Cymbalta .   Overactive Bladder Continue oxybutynin .   History of recent right hip surgery in May 2025: Inpatient rehab   Diffuse Ecchymoses Bruises all over Platelets ok, will monitor Continue to monitor, may need follow up with derm and/or hematology   Goals of care: Seen by palliative 7/5, they've signed off    Procedures: 7/7 Closed reduction right hip.    7/5  Irrigation debridement of right elbow with hardware removal olecranon excision and triceps advancement    Consultations: ortho  Discharge Exam: Vitals:   12/08/23 0338 12/08/23 0940  BP: (!) 165/79 130/80  Pulse: 86 (!) 111  Resp: 16  18  Temp: 98.1 F (36.7 C) 98.5 F (36.9 C)  SpO2: 98% 99%   No new complaints Daughter and husband at bedside  General: No acute distress. Cardiovascular: RRR Lungs: unlabored Neurological: Alert and oriented 3. Moves all extremities 4. Cranial  nerves II through XII grossly intact. Skin: diffuse bruising, skin tear to RLE (several covered by dressings) Extremities: JP to RLE  Discharge Instructions   Discharge Instructions     Call MD for:  difficulty breathing, headache or visual disturbances   Complete by: As directed    Call MD for:  extreme fatigue   Complete by: As directed    Call MD for:  hives   Complete by: As directed    Call MD for:  persistant dizziness or light-headedness   Complete by: As directed    Call MD for:  persistant nausea and vomiting   Complete by: As directed    Call MD for:  redness, tenderness, or signs of infection (pain, swelling, redness, odor or green/yellow discharge around incision site)   Complete by: As directed    Call MD for:  severe uncontrolled pain   Complete by: As directed    Call MD for:  temperature >100.4   Complete by: As directed    Diet - low sodium heart healthy   Complete by: As directed    Discharge instructions   Complete by: As directed    You were seen for Damari Hiltz wound dehiscence with exposed hardware.    You had I&D of right elbow and hardware removal.  You'll continue Reneta Niehaus course of antibiotics for about 2 weeks.  You should follow up with orthopedics within 2 weeks of surgery.    You had dislocation of your right hip arthroplasty.  You've had revision of your right total hip arthroplasty.  This should be followed with Dr. Fidel outpatient.  You have bruising all over, follow with your outpatient provider regarding this.  You may need additional workup.   Return for new, recurrent, or worsening symptoms.  Please ask your PCP to request records from this hospitalization so they know what was done and what the next steps will be.   Discharge wound care:   Complete by: As directed    Per orthopedics   Increase activity slowly   Complete by: As directed       Allergies as of 12/08/2023       Reactions   Darvon [propoxyphene Hcl] Shortness Of Breath   ? Dose  Related ? Lowered respirations greatly   Demerol [meperidine] Shortness Of Breath, Other (See Comments)   Respiratory Distress   Penicillins Hives, Other (See Comments)   Has patient had Keyla Milone PCN reaction causing immediate rash, facial/tongue/throat swelling, SOB or lightheadedness with hypotension: No SEVERE RASH INVOLVING MUCUS MEMBRANES or SKIN NECROSIS: #  #  #  YES  #  #  #  Has patient had Oracio Galen PCN reaction that required hospitalization No Has patient had Brigitta Pricer PCN reaction occurring within the last 10 years: No.   Imuran [azathioprine] Other (See Comments)   Weakness   Sulfa  Antibiotics Nausea Only, Other (See Comments)   States had severe indigestion and heartburn and nausea and had to stop taking   Arava [leflunomide] Other (See Comments)   Excessive weight gain   Lipitor [atorvastatin ] Nausea And Vomiting   Currently taking 2 times weekly - able to tolerate low dose    Vytorin [ezetimibe-simvastatin] Nausea And Vomiting  Medication List     PAUSE taking these medications    [name]  15 MG Tb24 Wait to take this until your doctor or other care provider tells you to start again. Resume when cleared by your doctors Generic drug: Upadacitinib  ER Take 15 mg by mouth daily.       STOP taking these medications    aspirin  81 MG chewable tablet Commonly known as: Aspirin  Childrens Replaced by: aspirin  EC 81 MG tablet   cephALEXin  500 MG capsule Commonly known as: KEFLEX    traMADol  50 MG tablet Commonly known as: ULTRAM        TAKE these medications    acetaminophen  325 MG tablet Commonly known as: TYLENOL  Take 2 tablets (650 mg total) by mouth every 6 (six) hours as needed for mild pain (pain score 1-3) or fever (or Fever >/= 101).   aspirin  EC 81 MG tablet Take 1 tablet (81 mg total) by mouth 2 (two) times daily for 28 days. Swallow whole. Replaces: aspirin  81 MG chewable tablet   atorvastatin  10 MG tablet Commonly known as: LIPITOR TAKE 1 TAB ON MONDAYS AND  FRIDAY   Biotin  5000 MCG Tabs Take 10,000 mcg by mouth in the morning.   bisacodyl  10 MG suppository Commonly known as: DULCOLAX Place 1 suppository (10 mg total) rectally daily as needed for moderate constipation.   carvedilol  3.125 MG tablet Commonly known as: COREG  Take 1 tablet (3.125 mg total) by mouth 2 (two) times daily with Talishia Betzler meal.   cefadroxil  500 MG capsule Commonly known as: DURICEF Take 1 capsule (500 mg total) by mouth 2 (two) times daily for 7 days.   chlorhexidine  4 % external liquid Commonly known as: HIBICLENS  Apply 15 mLs (1 Application total) topically as directed for 30 doses. Use as directed daily for 5 days every other week for 6 weeks. What changed: Another medication with the same name was added. Make sure you understand how and when to take each.   chlorhexidine  4 % external liquid Commonly known as: HIBICLENS  Apply 15 mLs (1 Application total) topically as directed for 30 doses. Use as directed daily for 5 days every other week for 6 weeks. What changed: You were already taking [name] medication with the same name, and this prescription was added. Make sure you understand how and when to take each.   cholecalciferol  25 MCG (1000 UNIT) tablet Commonly known as: VITAMIN D3 Take 1,000 Units by mouth in the morning.   cyclobenzaprine  10 MG tablet Commonly known as: FLEXERIL  TAKE 1 TABLET BY MOUTH THREE TIMES Alyra Patty DAY AS NEEDED FOR MUSCLE SPASM What changed: See the new instructions.   diazepam  5 MG tablet Commonly known as: VALIUM  TAKE 1 TABLET BY MOUTH AT BEDTIME AS NEEDED FOR MUSCLE SPASMS. What changed: See the new instructions.   diclofenac sodium 1 % Gel Commonly known as: VOLTAREN APPLY 3 GRAMS TO 3 LARGE JOINTS 3 TIMES [place]ona [place] DAY AS NEEDED   docusate sodium  100 MG capsule Commonly known as: COLACE Take 1 capsule (100 mg total) by mouth daily.   DULoxetine  20 MG capsule Commonly known as: CYMBALTA  Take 20 mg by mouth at bedtime.   folic acid  1 MG  tablet Commonly known as: FOLVITE  Take 1 tablet (1 mg total) by mouth daily.   furosemide  20 MG tablet Commonly known as: LASIX  TAKE 1 TABLET BY MOUTH EVERY DAY   LUBRICATING EYE DROPS OP Place 1 drop into both eyes daily as needed (dry eyes).   methotrexate  2.5 MG tablet  Commonly known as: RHEUMATREX Take 5 tablets (12.5 mg total) by mouth once Rafiq Bucklin week. Caution:Chemotherapy. Protect from light.   mupirocin  ointment 2 % Commonly known as: BACTROBAN  Place 1 Application into the nose 2 (two) times daily for 60 doses. Use as directed 2 times daily for 5 days every other week for 6 weeks. What changed: Another medication with the same name was added. Make sure you understand how and when to take each.   mupirocin  ointment 2 % Commonly known as: BACTROBAN  Place 1 Application into the nose 2 (two) times daily for 60 doses. Use as directed 2 times daily for 5 days every other week for 6 weeks. What changed: You were already taking Milo Schreier medication with the same name, and this prescription was added. Make sure you understand how and when to take each.   oxybutynin  5 MG tablet Commonly known as: DITROPAN  Take 5 mg by mouth daily.   oxyCODONE  5 MG immediate release tablet Commonly known as: Roxicodone  Take 1 tablet (5 mg total) by mouth every 4 (four) hours as needed for up to 7 days for severe pain (pain score 7-10) or moderate pain (pain score 4-6).   pantoprazole  40 MG tablet Commonly known as: PROTONIX  Take 1 tablet (40 mg total) by mouth 2 (two) times daily before Jearl Soto meal.   polyethylene glycol 17 g packet Commonly known as: MIRALAX  / GLYCOLAX  Take 17 g by mouth daily as needed for mild constipation.   predniSONE  5 MG tablet Commonly known as: DELTASONE  TAKE 1 TABLET BY MOUTH EVERY DAY IN THE MORNING   Prolia  60 MG/ML Sosy injection Generic drug: denosumab  Inject 60 mg into the skin every 6 (six) months. Deliver to rheum: [address], [address], [place], [place] [address]. Appt on  07/23/2022   senna 8.6 MG tablet Commonly known as: SENOKOT Take 1 tablet by mouth 2 (two) times daily.               Discharge Care Instructions  (From admission, onward)           Start     Ordered   12/08/23 0000  Discharge wound care:       Comments: Per orthopedics   12/08/23 1342           Allergies  Allergen Reactions   Darvon [Propoxyphene Hcl] Shortness Of Breath    ? Dose Related ? Lowered respirations greatly   Demerol [Meperidine] Shortness Of Breath and Other (See Comments)    Respiratory Distress   Penicillins Hives and Other (See Comments)    Has patient had Nyelle Wolfson PCN reaction causing immediate rash, facial/tongue/throat swelling, SOB or lightheadedness with hypotension: No SEVERE RASH INVOLVING MUCUS MEMBRANES or SKIN NECROSIS: #  #  #  YES  #  #  #  Has patient had Xiana Carns PCN reaction that required hospitalization No Has patient had Crisol Muecke PCN reaction occurring within the last 10 years: No.    Imuran [Azathioprine] Other (See Comments)    Weakness   Sulfa  Antibiotics Nausea Only and Other (See Comments)    States had severe indigestion and heartburn and nausea and had to stop taking   Arava [Leflunomide] Other (See Comments)    Excessive weight gain   Lipitor [Atorvastatin ] Nausea And Vomiting    Currently taking 2 times weekly - able to tolerate low dose    Vytorin [Ezetimibe-Simvastatin] Nausea And Vomiting    Follow-up Information     Edna Toribio LABOR, MD Follow up in 2  week(s).   Specialty: Orthopedic Surgery Contact information: [address] [place] [address] [phone]         Leigh Valery RAMAN, [place]. Schedule an appointment as soon as possible for Verniece Encarnacion visit in 7 day(s).   Specialty: Orthopedic Surgery Why: Follow-up within 7 days of discharge for removal of Prevena negative pressure incisional dressing. JP drain check, and wound check. Contact information: [address] [place]  [address] [phone]                  The results of significant diagnostics from this hospitalization (including imaging, microbiology, ancillary and laboratory) are listed below for reference.    Significant Diagnostic Studies: DG Pelvis Portable Result Date: 12/03/2023 CLINICAL DATA:  Status post revision of right total hip replacement. EXAM: PORTABLE PELVIS 1-2 VIEWS COMPARISON:  November 30, 2023. FINDINGS: Status post bilateral total hip arthroplasties. No fracture or dislocation is noted. Expected postoperative changes seen around the right hip. IMPRESSION: Postsurgical changes as noted above. Electronically Signed   By: Lynwood Landy Raddle M.D.   On: 12/03/2023 19:37   DG HIP UNILAT WITH PELVIS 1V RIGHT Result Date: 12/03/2023 CLINICAL DATA:  Right total hip arthroplasty. EXAM: DG HIP (WITH OR WITHOUT PELVIS) 1V RIGHT; DG C-ARM 1-60 MIN-NO REPORT Radiation exposure index: 0.6844 mGy. COMPARISON:  November 30, 2023. FINDINGS: Two intraoperative fluoroscopic images were obtained of the right hip. Right acetabular and femoral components are well situated. IMPRESSION: Status post right total hip arthroplasty.  No dislocation is noted. Electronically Signed   By: Lynwood Landy Raddle M.D.   On: 12/03/2023 18:12   DG C-Arm 1-60 Min-No Report Result Date: 12/03/2023 Fluoroscopy was utilized by the requesting physician.  No radiographic interpretation.   DG C-Arm 1-60 Min-No Report Result Date: 12/03/2023 Fluoroscopy was utilized by the requesting physician.  No radiographic interpretation.   DG HIP UNILAT WITH PELVIS 1V RIGHT Result Date: 11/30/2023 CLINICAL DATA:  Elective surgery.  Closed reduction of right hip. EXAM: DG HIP (WITH OR WITHOUT PELVIS) 1V RIGHT COMPARISON:  Pelvis radiograph earlier today FINDINGS: Persistent superior dislocation of the femoral component of right hip arthroplasty. The femoral component may be slightly more laterally displaced than on prior exam. Overlying skin staples remain  in place. Barium previously in the small bowel has progressed to the colon. 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 The femoral component may be slightly more laterally displaced than on prior exam. Electronically Signed   By: Andrea Gasman M.D.   On: 11/30/2023 19:54   DG Pelvis Portable Result Date: 11/30/2023 CLINICAL DATA:  [id number] Hip instability, right [id number] EXAM: PORTABLE PELVIS 1-2 VIEWS COMPARISON:  11/16/2023 FINDINGS: Please note the distal femoral stem is not included in the field of view. The femoral component of right hip arthroplasty remains superiorly dislocated with respect to the acetabular cup. There may be more superior migration than on prior exam. No definite fracture is seen. Overlying skin staples remain in place. Portions of left hip arthroplasty are visualized. 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 Electronically Signed   By: Andrea Gasman M.D.   On: 11/30/2023 11:52   DG Swallowing Func-Speech Pathology Result Date: 11/30/2023 Table formatting from the original result was not included. Modified Barium Swallow Study Patient Details Name: ALYLA PIETILA MRN: [id number] Date of Birth: 05-03-1941 Today's Date: 11/30/2023 HPI/PMH: HPI: Pt is an 83 y.o. female who presented to  the ED 7/4 due to concerns for exposed surgical hardware from previous elbow surgery in June. SLP consulted due to RN's observance of coughing with liquids. PMH: hypertension, rheumatoid arthritis, chronic pain, urinary retention. Clinical Impression: Clinical Impression: Pt demonstrates Corleone Biegler moderate to severe oropharyngeal dysphagia with Franchesca Veneziano DIGEST score of 4 due to chronic and gross aspiration of thin liquids and poor efficiency of swallowing with greater than 25% of bolus residue remaining in valleculae with solids. Primary problem is decreased epiglottic deflection and decreased PES opening, both  appearing due to structural changes of pharynx after ACDF. Pt has good laryngeal elevation and closure with initial swallow, but residue in valleculae and above PES is penetrated post swallow and ultimately aspirated with insufficient sensation. Attempted head turns, chin tuck, supraglottic swallow with thin, none of which prevented aspiration. Jaeleen Inzunza chin tuck increased epiglottic deflection, but worsened PES opening and severity of aspiration. Head turn did not improve efficiency with solids. AP view showed symmetrical residue. Pt did show potential to improve epiglottic deflection, base of tongue retraction and ultimately bolus propulsion with effort and strengthing exercises to this area may be of benefit. Pt was able to better manage mildly thick/nectar liquids with improved airway proection and time for multiple swallows. Will need to discuss pts wishes. This dysphagia is chronic, but pt is deconditioned at this time (recent hip fx and arm surgery) and has lost weight possibly due to dysphagia. Will advise nectar thick liquids, IDDSI 5 solids, multiple swallow and coughing, RMST, water  protocol and BOT and pharyngeal stregthening in next session. Factors that may increase risk of adverse event in presence of aspiration Noe & Lianne 2021): No data recorded Recommendations/Plan: Swallowing Evaluation Recommendations Swallowing Evaluation Recommendations Recommendations: PO diet; Free water  protocol after oral care PO Diet Recommendation: Dysphagia 2 (Finely chopped); Mildly thick liquids (Level 2, nectar thick) Liquid Administration via: Cup; Straw Medication Administration: Whole meds with liquid Supervision: Patient able to self-feed Swallowing strategies  : Slow rate; Small bites/sips; effortful swallow; Multiple dry swallows after each bite/sip; Follow solids with liquids; Clear throat intermittently Postural changes: Position pt fully upright for meals Oral care recommendations: Oral care before ice  chips/water ; Oral care before PO Treatment Plan Treatment Plan Treatment recommendations: Therapy as outlined in treatment plan below Follow-up recommendations: Acute inpatient rehab (3 hours/day) Functional status assessment: Patient has had Levaughn Puccinelli recent decline in their functional status and demonstrates the ability to make significant improvements in function in Camdyn Laden reasonable and predictable amount of time. Treatment frequency: Min 2x/week Treatment duration: 2 weeks Interventions: Aspiration precaution training; Oropharyngeal exercises; Compensatory techniques; Patient/family education; Diet toleration management by SLP; Respiratory muscle strength training Recommendations Recommendations for follow up therapy are one component of Annahi Short multi-disciplinary discharge planning process, led by the attending physician.  Recommendations may be updated based on patient status, additional functional criteria and insurance authorization. Assessment: Orofacial Exam: Orofacial Exam Oral Cavity: Oral Hygiene: WFL Oral Cavity - Dentition: Adequate natural dentition Anatomy: Anatomy: Presence of cervical hardware Boluses Administered: Boluses Administered Boluses Administered: Thin liquids (Level 0); Mildly thick liquids (Level 2, nectar thick); Puree; Solid  Oral Impairment Domain: Oral Impairment Domain Lip Closure: No labial escape Tongue control during bolus hold: Cohesive bolus between tongue to palatal seal Bolus preparation/mastication: Timely and efficient chewing and mashing Bolus transport/lingual motion: Brisk tongue motion Oral residue: Trace residue lining oral structures Location of oral residue : Tongue Initiation of pharyngeal swallow : Posterior angle of the ramus  Pharyngeal Impairment Domain: Pharyngeal Impairment Domain Soft  palate elevation: No bolus between soft palate (SP)/pharyngeal wall (PW) Laryngeal elevation: Complete superior movement of thyroid  cartilage with complete approximation of arytenoids to  epiglottic petiole Anterior hyoid excursion: Complete anterior movement Epiglottic movement: Partial inversion Laryngeal vestibule closure: Complete, no air/contrast in laryngeal vestibule Pharyngeal stripping wave : Present - diminished Pharyngeal contraction (Sebrena Engh/P view only): Complete Pharyngoesophageal segment opening: Partial distention/partial duration, partial obstruction of flow Tongue base retraction: Wide column of contrast or air between tongue base and PPW Pharyngeal residue: Majority of contrast within or on pharyngeal structures Location of pharyngeal residue: Valleculae; Pyriform sinuses; Tongue base  Esophageal Impairment Domain: No data recorded Pill: No data recorded Penetration/Aspiration Scale Score: Penetration/Aspiration Scale Score 1.  Material does not enter airway: Puree; Solid 2.  Material enters airway, remains ABOVE vocal cords then ejected out: Thin liquids (Level 0); Mildly thick liquids (Level 2, nectar thick) 4.  Material enters airway, CONTACTS cords then ejected out: Mildly thick liquids (Level 2, nectar thick) 5.  Material enters airway, CONTACTS cords and not ejected out: Thin liquids (Level 0) 7.  Material enters airway, passes BELOW cords and not ejected out despite cough attempt by patient: Thin liquids (Level 0) 8.  Material enters airway, passes BELOW cords without attempt by patient to eject out (silent aspiration) : Thin liquids (Level 0) Compensatory Strategies: Compensatory Strategies Compensatory strategies: Yes Straw: Ineffective Effortful swallow: Effective Effective Effortful Swallow: Puree; Mildly thick liquid (Level 2, nectar thick) Multiple swallows: Effective Effective Multiple Swallows: Mildly thick liquid (Level 2, nectar thick); Puree Chin tuck: Ineffective Ineffective Chin Tuck: Thin liquid (Level 0) Liquid wash: Effective Effective Liquid Wash: Mildly thick liquid (Level 2, nectar thick); Puree Left head turn: Ineffective Ineffective Left Head Turn: Mildly  thick liquid (Level 2, nectar thick); Puree Right head turn: Ineffective Ineffective Right Head Turn: Mildly thick liquid (Level 2, nectar thick); Puree Supraglottic swallow: Ineffective Ineffective Supraglottic Swallow: Thin liquid (Level 0) Super supraglottic swallow: Ineffective Ineffective Super Supraglottic Swallow: Thin liquid (Level 0) Mendelsohn : Ineffective Ineffective Mendelsohn: Thin liquid (Level 0)   General Information: Caregiver present: No  Diet Prior to this Study: Regular; Thin liquids (Level 0)   Temperature : Normal   Respiratory Status: WFL   Supplemental O2: None (Room air)   History of Recent Intubation: No  Behavior/Cognition: Alert; Cooperative; Pleasant mood Self-Feeding Abilities: Able to self-feed Baseline vocal quality/speech: Dysphonic Volitional Cough: Able to elicit Volitional Swallow: Able to elicit No data recorded Goal Planning: Prognosis for improved oropharyngeal function: Good No data recorded No data recorded Patient/Family Stated Goal: none stated No data recorded Pain: Pain Assessment Pain Assessment: No/denies pain End of Session: Start Time:SLP Start Time (ACUTE ONLY): 0834 Stop Time: SLP Stop Time (ACUTE ONLY): 0856 Time Calculation:SLP Time Calculation (min) (ACUTE ONLY): 22 min Charges: SLP Evaluations $ SLP Speech Visit: 1 Visit SLP Evaluations $BSS Swallow: 1 Procedure $MBS Swallow: 1 Procedure $Swallowing Treatment: 1 Procedure SLP visit diagnosis: SLP Visit Diagnosis: Dysphagia, unspecified (R13.10) Past Medical History: Past Medical History: Diagnosis Date  Anxiety   takes Valium  daily as needed  Basal cell carcinoma 01/21/1988  left nostril (MOHS), sup-right calf (CX35FU)  Basal cell carcinoma 03/22/1996  right calf (CX35FU)  Basal cell carcinoma 03/31/1995  left wing nose  Bruises easily   d/t meds  Cancer (HCC)   basal cell ca, in situ- uterine   Cataracts, bilateral   removed bilateral  Chronic back pain   Dizziness   r/t to meds  GERD (gastroesophageal reflux  disease)  no meds on Sufyaan Palma regular basis but will take Tums if needed  Headache(784.0)   r/t neck issues  History of bronchitis 6-39yrs ago  History of colon polyps   benign  HOH (hard of hearing)   wears hearing aids  Hyperlipidemia   takes Atorvastatin  on Mondays and Fridays  Hypertension   Joint pain   Joint swelling   Multiple sclerosis (HCC)   Neuromuscular disorder (HCC)   Dr. FABIENE Crete- [facility], follows [facility]  Nocturia   PONV (postoperative nausea and vomiting)   trouble urinating after surgery in 2014  Postoperative nausea and vomiting 01/11/2019  Rheumatoid arthritis (HCC)   Dr Lorrayne weekly, RA- hands- knees- feet   Rheumatoid arthritis(714.0)   Dr Dolphus takes Xeljanz  daily  Right wrist fracture   Skin cancer   Squamous cell carcinoma of skin 11/02/2001  in situ-right knee (cx67fu)  Squamous cell carcinoma of skin 11/12/2011  in situ-left forearm (CX35FU), in situ-left foot (CX35FU)  Squamous cell carcinoma of skin 01/22/2012  in situ-left lower forearm (txpbx)  Squamous cell carcinoma of skin 11/17/2012  right shin (txpbx)  Squamous cell carcinoma of skin 10/28/2013  in situ-right shoulder (CX35FU)  Squamous cell carcinoma of skin 04/28/2014  well diff-right shin (txpbx)  Squamous cell carcinoma of skin 11/02/2014  in situ-Left shin (txpbx)  Squamous cell carcinoma of skin 12/15/2014  in situ-Left hand (txpbx), bowens-left side chest (txpbx)  Squamous cell carcinoma of skin 05/09/2015  in situ-left hand (txpbx)   Squamous cell carcinoma of skin 05/07/2016  in situ-left inner shin,ant, in situ-left inner shin, post, in situ-left outer forearm, in situ-right knuckle  Squamous cell carcinoma of skin 03/1302018  in situ-left shoulder (txpbx), in situ-right inner shin (txpbx), in situ-top of left foot (txpbx), KA- left forearm (txpbx)  Squamous cell carcinoma of skin 12/02/2016  in situ-left inner shin (txpbx)  Squamous cell carcinoma of skin 03/04/2017  in situ-left outer sup, shin  (txpbx), in situ- right 2nd knuckle finger (txpbx), in situ- Left wrist (txpbx)  Squamous cell carcinoma of skin 04/06/2018  in situ-above left knee inner (txpbx)  Squamous cell carcinoma of skin 04/21/2018  in situ-right top hand (txpbx)  Squamous cell carcinoma of skin 07/08/2018  in situ-right lower inner shin (txpbx)  Squamous cell carcinoma of skin 04/27/2019  in situ- Left neck(CX35FU), In situ- right neck (CX35FU)  Urinary retention   sees Dr.Wrenn about 2 times Tianne Plott yr Past Surgical History: Past Surgical History: Procedure Laterality Date  ABDOMINAL HYSTERECTOMY    1985  ANTERIOR CERVICAL DECOMP/DISCECTOMY FUSION N/Jhamal Plucinski 04/23/2022  Procedure: Anterior Cervical Decompression/Discectomy Fusion - Cervical Seven-Thoracic One;  Surgeon: Louis Shove, MD;  Location: [facility];  Service: Neurosurgery;  Laterality: N/Kannon Granderson;  3C  APPENDECTOMY    with TAH  BACK SURGERY    several  BREAST BIOPSY Right   Results were negative  BROW LIFT Bilateral 10/02/2016  Procedure: BILATERAL LOWER LID BLEPHAROPLASTY;  Surgeon: Laurie Loyd Redhead, MD;  Location: [facility] OR;  Service: Plastics;  Laterality: Bilateral;  CARPAL TUNNEL RELEASE Right   cataracts    CERVICAL FUSION    Dr Leeann  CERVICAL FUSION  12/31/2012  Dr Leeann  CERVICAL FUSION  04/2022  CHEST TUBE INSERTION    for traumatic Pneumothorax  COLONOSCOPY  07/16/2010  normal   COLONOSCOPY W/ POLYPECTOMY  1997  negative since; Dr Debrah  ESOPHAGOGASTRODUODENOSCOPY  07/16/2010  normal  eye lid raise    EYE SURGERY Bilateral   cataracts removed - /w IOL  HARDWARE  REMOVAL  08/2021  HIP CLOSED REDUCTION Right 11/16/2023  Procedure: CLOSED MANIPULATION, JOINT, HIP;  Surgeon: Ernie Cough, MD;  Location: [facility];  Service: Orthopedics;  Laterality: Right;  INCISION AND DRAINAGE OF WOUND Right 11/28/2023  Procedure: IRRIGATION AND DEBRIDEMENT WOUND; REMOVAL OF HARDWARE;  Surgeon: Edna Toribio LABOR, MD;  Location: [facility];  Service: Orthopedics;  Laterality: Right;  I & D RIGHT ELBOW  JOINT  REPLACEMENT    LUMBAR FUSION    Dr Leeann  NASAL SINUS SURGERY    ORIF ELBOW FRACTURE Right 11/20/2023  Procedure: OPEN REDUCTION INTERNAL FIXATION (ORIF) ELBOW/OLECRANON FRACTURE;  Surgeon: [name] Franky SQUIBB, MD;  Location: [facility];  Service: Orthopedics;  Laterality: Right;  POSTERIOR CERVICAL FUSION/FORAMINOTOMY N/Blakleigh Straw 12/31/2012  Procedure: POSTERIOR LATERAL CERVICAL FUSION/FORAMINOTOMY LEVEL 1 CERVICAL THREE-FOUR WITH LATERAL MASS SCREWS;  Surgeon: Catalina CHRISTELLA Leeann, MD;  Location: [facility];  Service: Neurosurgery;  Laterality: N/Deone Omahoney;  PTOSIS REPAIR Bilateral 10/02/2016  Procedure: INTERNAL PTOSIS REPAIR;  Surgeon: Laurie Loyd Redhead, MD;  Location: [facility];  Service: Plastics;  Laterality: Bilateral;  SKIN BIOPSY    THYROID  SURGERY    R lobe removed- 1972, has grown back - CT last done- 2018  TOTAL HIP ARTHROPLASTY  12/22/2011  Procedure: TOTAL HIP ARTHROPLASTY;  Surgeon: Dempsey JINNY Sensor, MD;  Location: [facility];  Service: Orthopedics;  Laterality: Left;  TOTAL HIP ARTHROPLASTY Right 11/12/2023  Procedure: ARTHROPLASTY, HIP, TOTAL, ANTERIOR APPROACH;  Surgeon: Fidel Rogue, MD;  Location: [facility];  Service: Orthopedics;  Laterality: Right;  TOTAL SHOULDER ARTHROPLASTY    TUBAL LIGATION    UPPER GASTROINTESTINAL ENDOSCOPY  2012  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 No additional acute bony abnormality. IMPRESSION: Postoperative changes as above. Electronically Signed   By: Franky Crease M.D.   On: 11/28/2023 16:22   DG ELBOW COMPLETE RIGHT (3+VIEW) Result Date: 11/27/2023 CLINICAL DATA:  [id number] Fracture of right olecranon  process [id number] EXAM: RIGHT ELBOW - COMPLETE 3+ VIEW COMPARISON:  X-ray right elbow 11/20/2023, x-ray right elbow 11/12/2023 FINDINGS: Screw fixation of olecranon fracture with interval migration of the screw and fracture fragment out of the screw and alignments. Trace periosteal reaction along the olecranon. Question destruction of the fracture fragments. No acute fracture identified. No dislocation. No definite joint effusion. Subcutaneus soft tissue edema. IMPRESSION: 1. Nonhealing fracture of the olecranon with migrated/malpositioned screw. 2. Question destruction of the fracture fragments. Underlying infection/osteomyelitis not excluded. Electronically Signed   By: Morgane  Naveau M.D.   On: 11/27/2023 01:09   DG Elbow 2 Views Right Result Date: 11/20/2023 CLINICAL DATA:  Fracture, postop. EXAM: RIGHT ELBOW - 2 VIEW COMPARISON:  Preoperative radiograph FINDINGS: Screw traverses the olecranon fracture. There is improved fracture alignment from preoperative imaging. Persistent displacement of 6-7 mm at the articular surface. Overlying soft tissue edema and air related to recent surgery. IMPRESSION: ORIF of olecranon fracture. Electronically Signed   By: Andrea Gasman M.D.   On: 11/20/2023 11:14   DG ELBOW COMPLETE RIGHT (3+VIEW) Result Date: 11/20/2023 CLINICAL DATA:  Elective surgery EXAM: RIGHT ELBOW - COMPLETE 3+ VIEW COMPARISON:  Radiograph 11/12/2023 FINDINGS: Four fluoroscopic spot  views of the elbow submitted from the operating room. Screw traverses the proximal ulnar fracture with improved fracture alignment. Fluoroscopy time 23 seconds. Dose 0.29 mGy. IMPRESSION: Intraoperative fluoroscopy during proximal ulnar fracture fixation. Electronically Signed   By: Andrea Gasman M.D.   On: 11/20/2023 11:13   DG C-Arm 1-60 Min-No Report Result Date: 11/20/2023 Fluoroscopy was utilized by the requesting physician.  No radiographic interpretation.   DG Pelvis Portable Result Date: 11/16/2023 EXAM:  2 VIEW(S) XRAY OF THE PELVIS 11/16/2023 07:31:00 AM COMPARISON: None available. 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 FINDINGS: BONES AND JOINTS: The image at 07:24 am shows Adilenne Ashworth persistent superior dislocation of the right total hip arthroplasty. Image at 7:27 demonstrates the hip is located. Skin staples remain. Visualized left total hip arthroplasty is within normal limits. Lumbar fusion is noted. SOFT TISSUES: The soft tissues are unremarkable. IMPRESSION: 1. 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 2. Visualized left total hip arthroplasty is within normal limits. Electronically signed by: Lonni Necessary MD 11/16/2023 07:49 AM EDT RP Workstation: [id number]   DG Pelvis Portable Result Date: 11/15/2023 CLINICAL DATA:  Right hip pain status post right hip surgery 3 days ago. EXAM: PORTABLE PELVIS 1-2 VIEWS COMPARISON:  11/12/2023 FINDINGS: Bilateral hip replacements. There is superior dislocation of the right hip replacement. The acetabular cup appears more vertically oriented than on prior study. No fracture. IMPRESSION: Bilateral replacements. Superior dislocation of the right hip replacement. The acetabular component appears more vertically oriented. Electronically Signed   By: Franky Crease M.D.   On: 11/15/2023 18:35   DG Pelvis Portable Result Date: 11/12/2023 CLINICAL DATA:  Status post total right hip arthroplasty. EXAM: PORTABLE PELVIS 1-2 VIEWS COMPARISON:  AP pelvis 11/10/2023, right hip radiographs 11/10/2023 FINDINGS: Interval total right hip arthroplasty. Redemonstration of total left hip arthroplasty. The distal femoral stems are not imaged. No perihardware lucency is seen to indicate hardware failure or loosening. Lateral right hip and thigh subcutaneous air expected postoperative change. Lateral right thigh surgical skin staples. Mild superior pubic symphysis joint space narrowing. Lumbar  spine and superior sacral posterior fusion hardware again noted. IMPRESSION: Interval total right hip arthroplasty without evidence of hardware failure. Electronically Signed   By: Tanda Lyons M.D.   On: 11/12/2023 19:09   DG Elbow 2 Views Right Result Date: 11/12/2023 CLINICAL DATA:  Right elbow fracture. EXAM: RIGHT ELBOW - 2 VIEW COMPARISON:  Right elbow radiographs 10/08/2023 FINDINGS: There is diffuse decreased bone mineralization. There is increased diastasis of the previously seen transverse fracture of the olecranon. Posteriorly, this diastasis measures up to 25 cm, previously 1.6 cm and anteriorly 1.0 cm, previously 0.9 cm. No healing bridging sclerosis or peripheral callus formation. Moderate radiocapitellar joint space narrowing. Elevation of the distal humeral fat pad suggesting an elbow joint effusion. IMPRESSION: Increased diastasis of the previously seen transverse fracture of the olecranon. No healing bridging sclerosis at this time. Electronically Signed   By: Tanda Lyons M.D.   On: 11/12/2023 19:08   DG HIP UNILAT WITH PELVIS 1V RIGHT Result Date: 11/12/2023 CLINICAL DATA:  Total hip arthroplasty anterior approach. EXAM: DG HIP (WITH OR WITHOUT PELVIS) 1V RIGHT COMPARISON:  Right hip radiographs 11/10/2023 FINDINGS: Images were performed intraoperatively without the presence of Keontae Levingston radiologist. The patient is undergoing total right hip arthroplasty. Partial visualization of prior total left hip arthroplasty. No hardware complication is seen. Total fluoroscopy images: 4 Total fluoroscopy time: 13 seconds Total dose:  Radiation Exposure Index (as provided by the fluoroscopic device): 1.39 mGy air Kerma Please see intraoperative findings for further detail. IMPRESSION: Intraoperative fluoroscopy for total right hip arthroplasty. Electronically Signed   By: Tanda Lyons M.D.   On: 11/12/2023 19:06   DG C-Arm 1-60 Min-No Report Result Date: 11/12/2023 Fluoroscopy was utilized by the  requesting physician.  No radiographic interpretation.   DG C-Arm 1-60 Min-No Report Result Date: 11/12/2023 Fluoroscopy was utilized by the requesting physician.  No radiographic interpretation.   DG C-Arm 1-60 Min-No Report Result Date: 11/12/2023 Fluoroscopy was utilized by the requesting physician.  No radiographic interpretation.   DG Pelvis 1-2 Views Result Date: 11/10/2023 CLINICAL DATA:  Right hip pain since fall 4 weeks ago. EXAM: PELVIS - 1-2 VIEW COMPARISON:  Hip series today FINDINGS: Postoperative changes from posterior fusion in the lower lumbar spine. Prior left hip replacement. No hardware complicating feature. Foreshortening of the right femoral neck compatible with impacted femoral neck fracture. No subluxation or dislocation. IMPRESSION: Impacted right femoral neck fracture. Electronically Signed   By: Franky Crease M.D.   On: 11/10/2023 16:39   DG HIP UNILAT WITH PELVIS 2-3 VIEWS RIGHT Result Date: 11/10/2023 CLINICAL DATA:  Fall 4 weeks ago, right hip pain EXAM: DG HIP (WITH OR WITHOUT PELVIS) 2-3V RIGHT COMPARISON:  07/12/2021 FINDINGS: There is foreshortening of the femoral neck most compatible with impacted femoral neck fracture. No subluxation or dislocation. IMPRESSION: Impacted right femoral neck fracture. Electronically Signed   By: Franky Crease M.D.   On: 11/10/2023 16:39    Microbiology: Recent Results (from the past 240 hours)  Surgical PCR screen     Status: None   Collection Time: 12/03/23 10:24 AM   Specimen: Nasal Mucosa; Nasal Swab  Result Value Ref Range Status   MRSA, PCR NEGATIVE NEGATIVE Final   Staphylococcus aureus NEGATIVE NEGATIVE Final    Comment: (NOTE) The Xpert SA Assay (FDA approved for NASAL specimens in patients 38 years of age and older), is one component of Nandan Willems comprehensive surveillance program. It is not intended to diagnose infection nor to guide or monitor treatment. Performed at [facility], [address] [place],  [place] [address]   Aerobic/Anaerobic Culture w Gram Stain (surgical/deep wound)     Status: None   Collection Time: 12/03/23  4:00 PM   Specimen: Synovial, Right Hip; Body Fluid  Result Value Ref Range Status   Specimen Description   Final    SYNOVIAL RIGHT HIP Performed at [facility], [address], [place], [place] [address]    Special Requests   Final    NONE Performed at [facility], [address], [place], [place] [address]    Gram Stain   Final    RARE WBC PRESENT, PREDOMINANTLY PMN NO ORGANISMS SEEN    Culture   Final    No growth aerobically or anaerobically. Performed at [facility], [address], [place], [place] [address]    Report Status 12/08/2023 FINAL  Final  Aerobic/Anaerobic Culture w Gram Stain (surgical/deep wound)     Status: None   Collection Time: 12/03/23  4:00 PM   Specimen: Synovial, Right Hip; Body Fluid  Result Value Ref Range Status   Specimen Description   Final    SYNOVIAL RIGHT HIP Performed at [facility], [address] [place], [place] [address]    Special Requests   Final    NONE Performed at [facility], [address] [place], [place] [address]    Gram Stain NO WBC SEEN NO ORGANISMS SEEN   Final   Culture   Final    No growth aerobically or anaerobically. Performed at [facility], [address] [place], [place] [address]    Report Status 12/08/2023 FINAL  Final  Aerobic Culture w Gram Stain (superficial specimen)     Status: None   Collection Time: 12/03/23  4:00 PM   Specimen: Synovium  Result Value Ref Range Status   Specimen Description SYNOVIAL  Final   Special Requests RT HIP SYNOVIAL FL 1  Final   Gram Stain   Final    RARE WBC PRESENT, PREDOMINANTLY PMN NO ORGANISMS SEEN    Culture   Final    NO GROWTH 3 DAYS Performed at [facility], [address], [place], [place] [address]    Report Status 12/07/2023 FINAL   Final  Aerobic Culture w Gram Stain (superficial specimen)     Status: None   Collection Time: 12/03/23  4:00 PM   Specimen: Synovium  Result Value Ref Range Status   Specimen Description SYNOVIAL  Final   Special Requests RT HIP SUB FL 1  Final   Gram Stain   Final    RARE WBC PRESENT, PREDOMINANTLY PMN NO ORGANISMS SEEN    Culture   Final    NO GROWTH 3 DAYS Performed at [facility], [address], [place], [place] [address]    Report Status 12/07/2023 FINAL  Final     Labs: Basic Metabolic Panel: Recent Labs  Lab 12/03/23 0726 12/04/23 0628 12/07/23 0653 12/08/23 0905  NA 136 132* 135 137  K 3.8 4.7 3.7 3.2*  CL 101 100 102 102  CO2 27 20* 25 23  GLUCOSE 90 99 84 81  BUN 6* 8 <5* <5*  CREATININE 0.64 0.67 0.67 0.68  CALCIUM  8.0* 7.7* 8.1* 8.0*  MG 1.8 1.7  --  1.8  PHOS 2.9 3.7  --  2.8   Liver Function Tests: Recent Labs  Lab 12/03/23 0726 12/04/23 0628  AST 19 26  ALT 9 6  ALKPHOS 57 52  BILITOT 0.8 1.3*  PROT 4.9* 4.8*  ALBUMIN  2.1* 2.2*   No results for input(s): LIPASE, AMYLASE in the last 168 hours. No results for input(s): AMMONIA in the last 168 hours. CBC: Recent Labs  Lab 12/03/23 0726 12/04/23 0628 12/07/23 0653 12/07/23 1246 12/08/23 0905  WBC 6.1 10.5 7.3  --  7.4  NEUTROABS 4.1 8.4*  --   --   --   HGB 8.8* 8.7* 7.9* 7.5* 10.5*  HCT 27.2* 27.9* 24.4* 22.7* 31.2*  MCV 99.6 104.9* 100.4*  --  95.1  PLT 283 243 292  --  330   Cardiac Enzymes: No results for input(s): CKTOTAL, CKMB, CKMBINDEX, TROPONINI in the last 168 hours. BNP: BNP (last 3 results) No results for input(s): BNP in the last 8760 hours.  ProBNP (last 3 results) No results for input(s): PROBNP in the last 8760 hours.  CBG: No results for input(s): GLUCAP in the last 168 hours.     Signed:  Meliton Monte MD.  [facility] 12/08/2023, 1:47 PM

## 2023-12-08 NOTE — Progress Notes (Signed)
 Signed     Expand All Collapse All          Physical Medicine and Rehabilitation Consult Reason for Consult:possible CIR Referring Physician: Dr Meliton Monte     HPI: Sherri [name] is a 83 y.o. R handed female with hx of MS, RA on Prednisone  and methotrexate  usually, frequent falls, with multiple falls this year; anxiety, HOH,  admitted 7/3- after dislocated R hip for 2nd time- s/p R hip THR revision on 7/10 after couldn't reduce it in OR on 7/7  . Of note, pt has recent admission  for femoral neck fx of R side 6/17- s/p THR on 6/19- 6/22 dislocated for 1st time and reduced 6/23.  During May, she also fx'd R olecranon, however ended up needing ORIF 6/27- and then when readmitted, was found to have bone of R elbow and hardware showing, so went back for hardware removal. She's on Doxy and Cefuroxime  for 4-6 weeks due to open bone/hardware showing.    Currently, pt has drain still in after surgery and a wound VAC placed to R hip.    Pt's methotrexate  has been held, but is still on Prednisone  5 mg daily.    Per chart, pt's LBM was 7/7- went back through chart- cannot see BM recorded.      Pt was also evaluated by SLP due to swallowing/choking episodes reported by family- SLP felt she would do best with nectar thick liquids due to dysphagia seen on swallowing testing. However, pt has refused this and continues to have unlimited thin liquids at bedside.  Also per therapy, seen to have poor safety awareness and  poor insight into deficits, of note.        She's received less pain meds today- last IV morphine  yesterday but still taking some Norco.  So far Norco has been taken 10 mg 2x today Yesterday took IV morphine  a few times and Norco about every 4-5 hours.   Per therapy notes, pt is mod  A of 2 squat pivot transfers; mod-max A for ADLs- and max A of 2 for toileting.    Social Hx: pt lives at home with husband who needs care- and has aides it appears- was brought to room in  transport w/c today- per daughter who's retired Charity fundraiser, pt has had 24/7 aides at home since came home from [facility]- where she refused to stay due to not happy with facility.          Review of Systems  Constitutional:  Positive for malaise/fatigue and weight loss.  HENT:  Positive for hearing loss.   Eyes: Negative.   Respiratory:  Positive for cough and shortness of breath.        Says she chokes with drinking- she's supposed to be on nectar thick liquids- pt refusing- also wasn't up at 90 degrees when attempted to drink when I was in room- changed this- in addition, per RN daughter, was having anxiety when I entered room.  Cardiovascular: Negative.   Gastrointestinal:  Positive for constipation. Negative for nausea.  Genitourinary:  Positive for frequency and urgency.       Cannot easily get to [facility], so using Purewick- which just leaked- so she is wet  Musculoskeletal:  Positive for falls, joint pain and myalgias.       Frequent falls  Skin:        A lot of ecchymoses  Neurological:  Positive for sensory change and focal weakness.       RLE  Endo/Heme/Allergies:  Bruises/bleeds easily.  Psychiatric/Behavioral:  The patient is nervous/anxious.   All other systems reviewed and are negative.      Past Medical History:  Diagnosis Date   Anxiety      takes Valium  daily as needed   Basal cell carcinoma 01/21/1988    left nostril (MOHS), sup-right calf (CX35FU)   Basal cell carcinoma 03/22/1996    right calf (CX35FU)   Basal cell carcinoma 03/31/1995    left wing nose   Bruises easily      d/t meds   Cancer (HCC)      basal cell ca, in situ- uterine    Cataracts, bilateral      removed bilateral   Chronic back pain     Dizziness      r/t to meds   GERD (gastroesophageal reflux disease)      no meds on a regular basis but will take Tums if needed   Headache(784.0)      r/t neck issues   History of bronchitis 6-57yrs ago   History of colon polyps      benign   HOH (hard  of hearing)      wears hearing aids   Hyperlipidemia      takes Atorvastatin  on Mondays and Fridays   Hypertension     Joint pain     Joint swelling     Multiple sclerosis (HCC)     Neuromuscular disorder (HCC)      Dr. FABIENE Crete- [facility], follows [facility].   Nocturia     PONV (postoperative nausea and vomiting)      trouble urinating after surgery in 2014   Postoperative nausea and vomiting 01/11/2019   Rheumatoid arthritis (HCC)      Dr Lorrayne weekly, RA- hands- knees- feet    Rheumatoid arthritis(714.0)      Dr Dolphus takes Xeljanz  daily   Right wrist fracture     Skin cancer     Squamous cell carcinoma of skin 11/02/2001    in situ-right knee (cx78fu)   Squamous cell carcinoma of skin 11/12/2011    in situ-left forearm (CX35FU), in situ-left foot (CX35FU)   Squamous cell carcinoma of skin 01/22/2012    in situ-left lower forearm (txpbx)   Squamous cell carcinoma of skin 11/17/2012    right shin (txpbx)   Squamous cell carcinoma of skin 10/28/2013    in situ-right shoulder (CX35FU)   Squamous cell carcinoma of skin 04/28/2014    well diff-right shin (txpbx)   Squamous cell carcinoma of skin 11/02/2014    in situ-Left shin (txpbx)   Squamous cell carcinoma of skin 12/15/2014    in situ-Left hand (txpbx), bowens-left side chest (txpbx)   Squamous cell carcinoma of skin 05/09/2015    in situ-left hand (txpbx)    Squamous cell carcinoma of skin 05/07/2016    in situ-left inner shin,ant, in situ-left inner shin, post, in situ-left outer forearm, in situ-right knuckle   Squamous cell carcinoma of skin 03/1302018    in situ-left shoulder (txpbx), in situ-right inner shin (txpbx), in situ-top of left foot (txpbx), KA- left forearm (txpbx)   Squamous cell carcinoma of skin 12/02/2016    in situ-left inner shin (txpbx)   Squamous cell carcinoma of skin 03/04/2017    in situ-left outer sup, shin (txpbx), in situ- right 2nd knuckle finger (txpbx), in situ- Left  wrist (txpbx)   Squamous cell carcinoma of skin 04/06/2018    in situ-above left knee inner (  txpbx)   Squamous cell carcinoma of skin 04/21/2018    in situ-right top hand (txpbx)   Squamous cell carcinoma of skin 07/08/2018    in situ-right lower inner shin (txpbx)   Squamous cell carcinoma of skin 04/27/2019    in situ- Left neck(CX35FU), In situ- right neck (CX35FU)   Urinary retention      sees Dr.Wrenn about 2 times a yr             Past Surgical History:  Procedure Laterality Date   ABDOMINAL HYSTERECTOMY        1985   ANTERIOR CERVICAL DECOMP/DISCECTOMY FUSION N/A 04/23/2022    Procedure: Anterior Cervical Decompression/Discectomy Fusion - Cervical Seven-Thoracic One;  Surgeon: Louis Shove, MD;  Location: [facility];  Service: Neurosurgery;  Laterality: N/A;  3C   APPENDECTOMY        with TAH   BACK SURGERY        several   BREAST BIOPSY Right      Results were negative   BROW LIFT Bilateral 10/02/2016    Procedure: BILATERAL LOWER LID BLEPHAROPLASTY;  Surgeon: Laurie Loyd Redhead, MD;  Location: [facility] OR;  Service: Plastics;  Laterality: Bilateral;   CARPAL TUNNEL RELEASE Right     cataracts       CERVICAL FUSION        Dr Leeann   CERVICAL FUSION   12/31/2012    Dr Leeann   CERVICAL FUSION   04/2022   CHEST TUBE INSERTION        for traumatic Pneumothorax   COLONOSCOPY   07/16/2010    normal    COLONOSCOPY W/ POLYPECTOMY   1997    negative since; Dr Debrah   ESOPHAGOGASTRODUODENOSCOPY   07/16/2010    normal   eye lid raise       EYE SURGERY Bilateral      cataracts removed - /w IOL   HARDWARE REMOVAL   08/2021   HIP CLOSED REDUCTION Right 11/16/2023    Procedure: CLOSED MANIPULATION, JOINT, HIP;  Surgeon: Ernie Cough, MD;  Location: [facility];  Service: Orthopedics;  Laterality: Right;   HIP CLOSED REDUCTION N/A 11/30/2023    Procedure: CLOSED REDUCTION, HIP;  Surgeon: Fidel Rogue, MD;  Location: [facility];  Service: Orthopedics;  Laterality: N/A;   INCISION AND  DRAINAGE OF WOUND Right 11/28/2023    Procedure: IRRIGATION AND DEBRIDEMENT WOUND; REMOVAL OF HARDWARE;  Surgeon: Edna Toribio LABOR, MD;  Location: [facility];  Service: Orthopedics;  Laterality: Right;  I & D RIGHT ELBOW   JOINT REPLACEMENT       LUMBAR FUSION        Dr Leeann   NASAL SINUS SURGERY       ORIF ELBOW FRACTURE Right 11/20/2023    Procedure: OPEN REDUCTION INTERNAL FIXATION (ORIF) ELBOW/OLECRANON FRACTURE;  Surgeon: [name] Franky SQUIBB, MD;  Location: [facility];  Service: Orthopedics;  Laterality: Right;   POSTERIOR CERVICAL FUSION/FORAMINOTOMY N/A 12/31/2012    Procedure: POSTERIOR LATERAL CERVICAL FUSION/FORAMINOTOMY LEVEL 1 CERVICAL THREE-FOUR WITH LATERAL MASS SCREWS;  Surgeon: Catalina CHRISTELLA Leeann, MD;  [facility]: [facility];  Service: Neurosurgery;  Laterality: N/A;   PTOSIS REPAIR Bilateral 10/02/2016    Procedure: INTERNAL PTOSIS REPAIR;  Surgeon: Laurie Loyd Redhead, MD;  Location: [facility];  Service: Plastics;  Laterality: Bilateral;   SKIN BIOPSY       THYROID  SURGERY        R lobe removed- 1972, has grown back - CT last done- 2018  TOTAL HIP ARTHROPLASTY   12/22/2011    Procedure: TOTAL HIP ARTHROPLASTY;  Surgeon: Dempsey JINNY Sensor, MD;  Location: [facility];  Service: Orthopedics;  Laterality: Left;   TOTAL HIP ARTHROPLASTY Right 11/12/2023    Procedure: ARTHROPLASTY, HIP, TOTAL, ANTERIOR APPROACH;  Surgeon: Fidel Rogue, MD;  Location: [facility];  Service: Orthopedics;  Laterality: Right;   TOTAL HIP REVISION Right 12/03/2023    Procedure: TOTAL HIP REVISION;  Surgeon: Fidel Rogue, MD;  Location: [facility];  Service: Orthopedics;  Laterality: Right;  Right acetabular revision Anterior approach   TOTAL SHOULDER ARTHROPLASTY       TUBAL LIGATION       UPPER GASTROINTESTINAL ENDOSCOPY   2012    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   WRIST SURGERY Left 09/16/2022    wrist/arm             Family History  Problem Relation Age of Onset   Cancer Mother          pancreatic   Diabetes Mother      Pancreatic cancer Mother     Heart disease Father          Rheumatic   Cancer Father          ? stomach   Stomach cancer Father     Cancer Sister          stomach   Stomach cancer Sister     Cancer Maternal Aunt          X 4; ? primary   Heart disease Paternal Aunt     Cancer Maternal Grandmother          cervical   Colon cancer Other          Aunts   Multiple sclerosis Daughter     Hypertension Neg Hx     Stroke Neg Hx     Esophageal cancer Neg Hx     Rectal cancer Neg Hx          Social History:  reports that she quit smoking about 42 years ago. Her smoking use included cigarettes. She started smoking about 62 years ago. She has a 50 pack-year smoking history. She has never been exposed to tobacco smoke. She has never used smokeless tobacco. She reports current alcohol  use of about 14.0 standard drinks of alcohol  per week. She reports that she does not use drugs. Allergies:  Allergies       Allergies  Allergen Reactions   Darvon [Propoxyphene Hcl] Shortness Of Breath      ? Dose Related ? Lowered respirations greatly   Demerol [Meperidine] Shortness Of Breath and Other (See Comments)      Respiratory Distress   Penicillins Hives and Other (See Comments)      Has patient had a PCN reaction causing immediate rash, facial/tongue/throat swelling, SOB or lightheadedness with hypotension: No SEVERE RASH INVOLVING MUCUS MEMBRANES or SKIN NECROSIS: #  #  #  YES  #  #  #  Has patient had a PCN reaction that required hospitalization No Has patient had a PCN reaction occurring within the last 10 years: No.     Imuran [Azathioprine] Other (See Comments)      Weakness   Sulfa  Antibiotics Nausea Only and Other (See Comments)      States had severe indigestion and heartburn and nausea and had to stop taking   Arava [Leflunomide] Other (See Comments)      Excessive weight gain   Lipitor [  Atorvastatin ] Nausea And Vomiting      Currently taking 2 times weekly - able to tolerate  low dose    Vytorin [Ezetimibe-Simvastatin] Nausea And Vomiting            Medications Prior to Admission  Medication Sig Dispense Refill   acetaminophen  (TYLENOL ) 325 MG tablet Take 2 tablets (650 mg total) by mouth every 6 (six) hours as needed for mild pain (pain score 1-3) or fever (or Fever >/= 101). 20 tablet 0   aspirin  (ASPIRIN  CHILDRENS) 81 MG chewable tablet Chew 1 tablet (81 mg total) by mouth 2 (two) times daily with a meal. 90 tablet 0   atorvastatin  (LIPITOR) 10 MG tablet TAKE 1 TAB ON MONDAYS AND FRIDAY 26 tablet 4   Biotin  5000 MCG TABS Take 10,000 mcg by mouth in the morning.       Carboxymethylcellul-Glycerin  (LUBRICATING EYE DROPS OP) Place 1 drop into both eyes daily as needed (dry eyes).       carvedilol  (COREG ) 3.125 MG tablet Take 1 tablet (3.125 mg total) by mouth 2 (two) times daily with a meal. 180 tablet 3   cephALEXin  (KEFLEX ) 500 MG capsule Take 500 mg by mouth 3 (three) times daily. For 10 days       chlorhexidine  (HIBICLENS ) 4 % external liquid Apply 15 mLs (1 Application total) topically as directed for 30 doses. Use as directed daily for 5 days every other week for 6 weeks. 946 mL 1   cholecalciferol  (VITAMIN D ) 25 MCG (1000 UNIT) tablet Take 1,000 Units by mouth in the morning.       cyclobenzaprine  (FLEXERIL ) 10 MG tablet TAKE 1 TABLET BY MOUTH THREE TIMES A DAY AS NEEDED FOR MUSCLE SPASM (Patient taking differently: Take 10 mg by mouth at bedtime.) 90 tablet 2   denosumab  (PROLIA ) 60 MG/ML SOSY injection Inject 60 mg into the skin every 6 (six) months. Deliver to rheum: [address], [place], [place] [address]. Appt on 07/23/2022 1 mL 0   diazepam  (VALIUM ) 5 MG tablet TAKE 1 TABLET BY MOUTH AT BEDTIME AS NEEDED FOR MUSCLE SPASMS. (Patient taking differently: Take 5 mg by mouth daily as needed for muscle spasms.) 30 tablet 5   docusate sodium  (COLACE) 100 MG capsule Take 1 capsule (100 mg total) by mouth daily. 10 capsule 0   DULoxetine  (CYMBALTA ) 20 MG  capsule Take 20 mg by mouth at bedtime.       folic acid  (FOLVITE ) 1 MG tablet Take 1 tablet (1 mg total) by mouth daily. 90 tablet 3   furosemide  (LASIX ) 20 MG tablet TAKE 1 TABLET BY MOUTH EVERY DAY 90 tablet 1   methotrexate  (RHEUMATREX) 2.5 MG tablet Take 5 tablets (12.5 mg total) by mouth once a week. Caution:Chemotherapy. Protect from light. 60 tablet 0   mupirocin  ointment (BACTROBAN ) 2 % Place 1 Application into the nose 2 (two) times daily for 60 doses. Use as directed 2 times daily for 5 days every other week for 6 weeks. 60 g 0   oxybutynin  (DITROPAN ) 5 MG tablet Take 5 mg by mouth daily.       pantoprazole  (PROTONIX ) 40 MG tablet Take 1 tablet (40 mg total) by mouth 2 (two) times daily before a meal. 60 tablet 5   polyethylene glycol (MIRALAX  / GLYCOLAX ) 17 g packet Take 17 g by mouth daily as needed for mild constipation. 14 each 0   predniSONE  (DELTASONE ) 5 MG tablet TAKE 1 TABLET BY MOUTH EVERY DAY IN  THE MORNING 90 tablet 0   senna (SENOKOT) 8.6 MG tablet Take 1 tablet by mouth 2 (two) times daily.       traMADol  (ULTRAM ) 50 MG tablet Take 25-50 mg by mouth every 8 (eight) hours as needed for moderate pain (pain score 4-6).       bisacodyl  (DULCOLAX) 10 MG suppository Place 1 suppository (10 mg total) rectally daily as needed for moderate constipation. 12 suppository 0   diclofenac sodium (VOLTAREN) 1 % GEL APPLY 3 GRAMS TO 3 LARGE JOINTS 3 TIMES A DAY AS NEEDED (Patient not taking: Reported on 11/27/2023) 100 g 0   Upadacitinib  ER ([name] ) 15 MG TB24 Take 15 mg by mouth daily. (Patient not taking: Reported on 11/27/2023)              Home: Home Living Family/patient expects to be discharged to:: Private residence Living Arrangements: Spouse/significant other Available Help at Discharge: Family, Available PRN/intermittently, Available 24 hours/day, Personal care attendant (Husband couldn't physically assist pt but could help with houshold management; Son leaves nearby; Daughter  reports 24/7 aides) Type of Home: House Home Access: Ramped entrance Home Layout: Able to live on main level with bedroom/bathroom Bathroom Shower/Tub: Psychologist, counselling, Engineer, manufacturing systems: Handicapped height Bathroom Accessibility: Yes Home Equipment: Rollator (4 wheels), BSC/3in1, Shower seat - built in, [employer] - tub/shower, Wheelchair - manual, Agricultural consultant (2 wheels), Other (comment) (adjustable bed) Additional Comments: Per dtr, 24/7 caregivers hired or family.  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  Lives With: Spouse  Functional History: Prior Function Prior Level of Function : Independent/Modified Independent, Driving Mobility Comments: Prior to May, independent without AD. Extensive fall history, all resulting in injuries. Pt reports at least 4 falls in the past 2-3 months. ADLs Comments: Indep with ADLs. Manages her own medications. Cooks/Cleans. Driving. Assists her husband prn. Functional Status:  Mobility: Bed Mobility Overal bed mobility: Needs Assistance Bed Mobility: Supine to Sit Supine to sit: Mod assist General bed mobility comments: Pt sat up on R side of bed with increased time and cues for sequencing. Assist to bring BLE off EOB and elevate trunk with use of bed pad. Scooted pt fwd til feet flat with bed pad. Transfers Overall transfer level: Needs assistance Equipment used: 2 person hand held assist Transfers: Sit to/from Stand, Bed to chair/wheelchair/BSC Sit to Stand: From elevated surface, Min assist, +2 physical assistance Bed to/from chair/wheelchair/BSC transfer type:: Squat pivot Stand pivot transfers: From elevated surface, Mod assist, +2 physical assistance Squat pivot transfers: Max assist, +2 physical assistance General transfer comment: Pt stood from raised bed height. Cues for proper hand placement using RW. Powered up with minAx2. Transferred to recliner chair on her left with modA x2, assist to manuever RW, and facilitate pivot turn. Good eccentric  control. Ambulation/Gait General Gait Details: Unable   ADL: ADL Overall ADL's : Needs assistance/impaired Eating/Feeding: Sitting, Set up Grooming: Sitting, Brushing hair, Oral care, Wash/dry face Upper Body Bathing: Moderate assistance, Sitting Lower Body Bathing: Maximal assistance, Sitting/lateral leans, Sit to/from stand Upper Body Dressing : Moderate assistance, Sitting Lower Body Dressing: Maximal assistance, Sitting/lateral leans, Sit to/from stand Toilet Transfer: Maximal assistance, +2 for physical assistance Toileting- Clothing Manipulation and Hygiene: Maximal assistance Functional mobility during ADLs: Maximal assistance, +2 for physical assistance   Cognition: Cognition Orientation Level: Oriented to person, Oriented to place, Oriented to situation, Disoriented to time Cognition Arousal: Alert Behavior During Therapy: [facility] for tasks assessed/performed   Blood pressure 139/87, pulse 96, temperature 98.3 F (36.8  C), resp. rate 17, height 5' 0.98 (1.549 m), weight 48.6 kg, SpO2 100%. Physical Exam Vitals and nursing note reviewed. Exam conducted with a chaperone present.  Constitutional:      Comments: Pt on low side of weight, appears bony in extremities; daughter at bedside; and husband/son joined 1/2 way through, initially sitting back in bed; then seen sitting up and on side when getting changed; No acute distress, however had anxiety attack/breaking concerns while I was in room for 8-10  minutes  HENT:     Head: Normocephalic.     Comments: Has ecchymoses on L side of face- per daughter, from intubation tube      Right Ear: External ear normal.     Left Ear: External ear normal.     Nose: Nose normal. No congestion.     Mouth/Throat:     Mouth: Mucous membranes are dry.     Pharynx: Oropharynx is clear. No oropharyngeal exudate.  Eyes:     General:        Right eye: No discharge.        Left eye: No discharge.     Extraocular Movements: Extraocular  movements intact.  Cardiovascular:     Rate and Rhythm: Regular rhythm. Tachycardia present.     Heart sounds: Normal heart sounds. No murmur heard.    No gallop.     Comments: Rate 100-105 Pulmonary:     Effort: Pulmonary effort is normal. No respiratory distress.     Breath sounds: Normal breath sounds. No wheezing, rhonchi or rales.     Comments: Eve during feeling like she couldn't breathe, pt was CTA b/l in all lobes front and back- was breathing rapidly maybe due to anxiety? Abdominal:     General: There is distension.     Palpations: Abdomen is soft.     Tenderness: There is no abdominal tenderness.     Comments: Hypoactive BS- LBM 7/7 per chart  Genitourinary:    Comments: Purewick in place- significant R labial ecchymoses- purple- not clear if was due to hip surgery or purewick? MASD significant around foam dressing on Sacrum (which per daughter was stage I pressure ulcer).  Musculoskeletal:     Comments: RUE-  deltoid/biceps at least 3/5- but painful ROM Large cast from above R elbow to R hand- can wiggle fingers on R hand LUE 5-/5 throughout LLE- 5-/5 throughout RLE- HF 2-/5 not clear if limited due to pain or neuro injury DF/PF 4/5 - neuro injury?  Skin:    Comments: VAC on R hip- has seal and suction R thigh drain in place Significant purple bruising on R lateral hip and thigh A lot of yellow bruising and abrasions on RLE Also has ecchymoses on LLE and Ue's  IV in L hand- and large foam dressing on L forearm RUE in cast/ACE wrap over it- fingers very slightly cool, but same temp as L hand  Neurological:     Mental Status: She is alert.     Comments: Slightly decreased safety awareness; focus on her anxiety- tangential somewhat Very HOH Decreased to light touch of entire RLE, per pt- medial, anterior and lateral and distal RLE Of note, has crossing of a few of her toes on R foot  Psychiatric:     Comments: Very anxious initially- calmed down after 10+ minutes, but  was easy to make her anxious- I.e. the discussion of meeting criteria of CIR.       Lab Results Last 24 Hours  Results for orders placed or performed during the hospital encounter of 11/26/23 (from the past 24 hours)  Type and screen [facility]     Status: None    Collection Time: 12/06/23 12:43 PM  Result Value Ref Range    ABO/RH(D) A POS      Antibody Screen NEG      Sample Expiration          12/09/2023,2359 Performed at [facility], [address] [place], [place] [address]      *Note: Due to a large number of results and/or encounters for the requested time period, some results have not been displayed. A complete set of results can be found in Results Review.      Imaging Results (Last 48 hours)  No results found.       Assessment/Plan: Diagnosis:  L total hip revision due to multiple dislocations- WBAT with posterio hip precautions and partial WB on RUE_ but can use platform walker Does the need for close, 24 hr/day medical supervision in concert with the patient's rehab needs make it unreasonable for this patient to be served in a less intensive setting? Yes Co-Morbidities requiring supervision/potential complications: RA, MS;  HOH, significant anxiety; frequent falls- R elbow/olecranon fx s/p ORIF and hardware removal due to it showing- dysphagia- refusing nectar thick liquids; on PO ABX for 4-6 weeks due to Elbow wound; has WOUND VAC- on R hip;  Due to bladder management, bowel management, safety, skin/wound care, disease management, medication administration, pain management, and patient education, does the patient require 24 hr/day rehab nursing? Yes Does the patient require coordinated care of a physician, rehab nurse, therapy disciplines of PT, OT and SLP for swallowing to address physical and functional deficits in the context of the above medical diagnosis(es)? Yes Addressing deficits in the following areas: balance, endurance,  locomotion, strength, transferring, bowel/bladder control, bathing, dressing, feeding, grooming, toileting, and swallowing Can the patient actively participate in an intensive therapy program of at least 3 hrs of therapy per day at least 5 days per week? Yes The potential for patient to make measurable gains while on inpatient rehab is fair Anticipated functional outcomes upon discharge from inpatient rehab are supervision and min assist  with PT, supervision and min assist with OT, supervision with SLP. Estimated rehab length of stay to reach the above functional goals is: ~ 2 weeks Anticipated discharge destination: Home Overall Rehab/Functional Prognosis: good and fair   RECOMMENDATIONS: This patient's condition is appropriate for continued rehabilitative care in the following setting: CIR Patient has agreed to participate in recommended program. Yes Note that insurance prior authorization may be required for reimbursement for recommended care.   Comment:  Patient has Stage I sacral decub- has foam dressing- suggest turning q2 hours Has significant MASD- suggest nystatin for backside- maybe even a few days of Diflucan  since on ABX and steroids.  Has decrease in sensation on entire RLE- might have a neuro injury-hard to tease out from orthopedic injury/pain in RLE after hip revision. Might benefit from Buspar  for anxiety- had anxiety attack when I was in room that couldn't breathe, in spite of swallowing thin liquids- pt was not able to associate thin liquids with some coughing/choking sensation even though we discussed it- of note, but significant anxiety component after episode of drinking, of note. Sats looked good- HR 102; and Lungs CTA B/L of note Strongly discussed with pt and daughter who's retired [employer] fundraiser about needing to use nectar thick liquids- pt  said she's think about it- if not, advised to sit up 90 degrees before drinking, which she did not follow 1x during interview.  Pt and family  are not interested in going to SNF, due to a poor experience at one earlier in this group of falls. Pt is  an appropriate candidate for inpt rehab- she was advised will need to do 3 hours/day of therapy, spaced out- will goal of d/c in ~ 2 weeks. Suggest stopping IV Morphine - last dose yesterday, but cannot use in CIR. Agree with Norco dosing. Will submit for admissions coordinator to get pt, hopefully, cleared by insurance. Per chart, pt's LBM was 7/7- strongly suggest assessing if this is correct and if so,  giving Sorbitol (having large amounts of pain meds from 7/10 til 7/12) 30 cc and then follow in 4-5 hours with soap suds enema if no results with Sorbitol on its own.  12. Thank you for this consult.      I spent a total of  84  minutes on total care today- >50% coordination of care- due to   Prolonged review of chart, due to complexity; review of MAR, meds given and ordered, labs, vitals and also had NT help take vitals while in room and help me assess skin; also prolonged d/w pt and daughter about nectar thick liquids, needs for 3 hours of therapy/day for CIR; and also d/w husband when he got there- they assured me of 24/7 care at home due to paid caregivers with aides as well- and documentation as well as d/w Admissions coordinator and NT.    Megan Lovorn, MD 12/06/2023

## 2023-12-08 NOTE — Progress Notes (Signed)
 Subjective:  Patient reports pain as mild.  Denies N/V/CP/SOB/Abd pain. She reports that she is doing well today.  Daughter at bedside.  CIR still pending.  No acute events overnight. PT has not seen today yet.    Objective:   VITALS:   Vitals:   12/07/23 1954 12/07/23 2215 12/08/23 0338 12/08/23 0940  BP: 138/77 (!) 149/79 (!) 165/79 130/80  Pulse: 94 89 86 (!) 111  Resp: 18 18 16 18   Temp: 98 F (36.7 C) 98.1 F (36.7 C) 98.1 F (36.7 C) 98.5 F (36.9 C)  TempSrc: Oral Oral Oral Oral  SpO2: 99% 99% 98% 99%  Weight:      Height:        NAD Neurologically intact Neurovascular intact Sensation intact distally Intact pulses distally Dorsiflexion/Plantar flexion intact Compartment soft Prevena dressing C/D, medial edge near groin seal maintained, still maintaining pressure.  JP drain dressing changed. 100cc SS output today so far. Continue drain.  Moving toes and ankle well on exam today No shortening or ER noted.   Lab Results  Component Value Date   WBC 7.4 12/08/2023   HGB 10.5 (L) 12/08/2023   HCT 31.2 (L) 12/08/2023   MCV 95.1 12/08/2023   PLT 330 12/08/2023   BMET    Component Value Date/Time   NA 137 12/08/2023 0905   NA 139 06/20/2019 1335   NA 136 02/22/2015 0000   K 3.2 (L) 12/08/2023 0905   K 4.4 02/22/2015 0000   CL 102 12/08/2023 0905   CL 98 02/22/2015 0000   CO2 23 12/08/2023 0905   CO2 30 02/22/2015 0000   GLUCOSE 81 12/08/2023 0905   BUN <5 (L) 12/08/2023 0905   BUN 10 06/20/2019 1335   CREATININE 0.68 12/08/2023 0905   CREATININE 1.03 (H) 08/06/2023 1510   CALCIUM  8.0 (L) 12/08/2023 0905   CALCIUM  9.6 02/22/2015 0000   EGFR 55 (L) 08/06/2023 1510   GFRNONAA >60 12/08/2023 0905   GFRNONAA 56 (L) 09/26/2020 1536     Assessment/Plan: 5 Days Post-Op   Principal Problem:   Wound dehiscence Active Problems:   Hyperlipidemia   Multiple sclerosis (HCC)   Essential hypertension   Rheumatoid arthritis (HCC)    Hyponatremia   Macrocytic anemia   Cellulitis of right elbow   ABLA. Hemoglobin 10.5 s/p 1 unit PRBC yesterday. Continue to monitor. Aspirin  held due to high bleeding risk.    Intraoperative cultures, grain stains negative, no growth so far. Continue to monitor.    WBAT with walker RLE, posterior hip precautions. RUE per Dr. Edna, partial weightbearing, okay to use walker.  DVT ppx: Aspirin  held due to high bleeding risk, SCDs, TEDS PO pain control PT/OT: Mobilize with PT as tolerated.  Dispo: - Patient under care of the medical team, disposition per their recommendation.  - Mobilize out of bed with PT.  - Follow IntraOp cultures.  - We will discontinue the JP drain when output is less than 10 cc per shift, okay to go home with if high output.  - Continue negative pressure dressing at 75 mmHg. Upon discharge, the house VAC suction unit will need to be exchanged for a portable Prevena suction unit.  - She will need to follow-up in the office within 7 days of discharge for removal of her negative pressure dressing.    Sherri [name] 12/08/2023, 1:56 PM   [facility]  [place] [address], [place], [place] [address] Phone: [phone] [email or web address] Facebook  Instagram  LinkedIn  Twitter     \

## 2023-12-08 NOTE — Plan of Care (Signed)
  Problem: Consults Goal: RH GENERAL PATIENT EDUCATION Description: See Patient Education module for education specifics. Outcome: Progressing   Problem: RH BOWEL ELIMINATION Goal: RH STG MANAGE BOWEL WITH ASSISTANCE Description: STG Manage Bowel with mod I Assistance. Outcome: Progressing Goal: RH STG MANAGE BOWEL W/MEDICATION W/ASSISTANCE Description: STG Manage Bowel with Medication with mod I Assistance. Outcome: Progressing   Problem: RH BLADDER ELIMINATION Goal: RH STG MANAGE BLADDER WITH ASSISTANCE Description: STG Manage Bladder With mod I Assistance Outcome: Progressing Goal: RH STG MANAGE BLADDER WITH MEDICATION WITH ASSISTANCE Description: STG Manage Bladder With Medication With mod I Assistance. Outcome: Progressing   Problem: RH SKIN INTEGRITY Goal: RH STG SKIN FREE OF INFECTION/BREAKDOWN Description: Manage skin w min assist Outcome: Progressing Goal: RH STG MAINTAIN SKIN INTEGRITY WITH ASSISTANCE Description: STG Maintain Skin Integrity With min  Assistance. Outcome: Progressing   Problem: RH SAFETY Goal: RH STG ADHERE TO SAFETY PRECAUTIONS W/ASSISTANCE/DEVICE Description: STG Adhere to Safety Precautions With cues Assistance/Device. Outcome: Progressing   Problem: RH PAIN MANAGEMENT Goal: RH STG PAIN MANAGED AT OR BELOW PT'S PAIN GOAL Description: Pain < 4 with prns Outcome: Progressing   Problem: RH KNOWLEDGE DEFICIT GENERAL Goal: RH STG INCREASE KNOWLEDGE OF SELF CARE AFTER HOSPITALIZATION Description: Patient and family will be able to manage care at discharge using educational resources for medications and dietary modification with skin care independently Outcome: Progressing

## 2023-12-08 NOTE — Progress Notes (Signed)
 Physical Therapy Treatment Patient Details Name: Sherri [name] MRN: [id number] DOB: 04-29-1941 Today's Date: 12/08/2023   History of Present Illness Pt is 83 y.o. female presented to ED 11/26/23 due to visible hardware at Rt elbow fx. Pt s/p I&D of right elbow with hardware removal, olecranon excision, and triceps advancement 7/5. Pt noted to have a shortened RLE and X-ray confirmed R hip redislocation 7/7. Taken to the OR and reduction attempt was unsuccessful 7/7. Pt s/p R THA revision with head ball and liner exchange 7/10. 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 PMH significant for anxiety, GERD, HLD, HTN, MS, HOH, back pain, ACDF C7-T1.    PT Comments  Pt up in chair on arrival, pleasant and eager for mobility with pt demonstrating continued progress towards acute goals. Session focused on RLE glute/quad activation in stance as pt with tendency for R knee buckling with gait. Pt able to perform standing marching with LLE with verbal and tactile cues for RLE engagement with minimal buckling noted. Pt with fair carryover with forward ambulation needing hands on assist to block R knee in stance. Pt up in chair at end of session with all needs met. Pt continues to benefit from skilled PT services to progress toward functional mobility goals.     If plan is discharge home, recommend the following: Two people to help with walking and/or transfers;A lot of help with bathing/dressing/bathroom;Assistance with cooking/housework;Assist for transportation;Help with stairs or ramp for entrance   Can travel by private vehicle     No  Equipment  Recommendations  None recommended by PT    Recommendations for Other Services       Precautions / Restrictions Precautions Precautions: Fall;Posterior Hip Precaution Booklet Issued: No Recall of Precautions/Restrictions: Impaired Precaution/Restrictions Comments: R JP drain, RLE wound vac Required Braces or Orthoses: Splint/Cast Splint/Cast: RUE Restrictions Weight Bearing Restrictions Per Provider Order: Yes RUE Weight Bearing Per Provider Order: Partial weight bearing RUE Partial Weight Bearing Percentage or Pounds: okay to use walker RLE Weight Bearing Per Provider Order: Weight bearing as tolerated     Mobility  Bed Mobility Overal bed mobility: Needs Assistance             General bed mobility comments: pt up in recliner on arrival    Transfers Overall transfer level: Needs assistance Equipment used: Rolling walker (2 wheels) Transfers: Sit to/from Stand, Bed to chair/wheelchair/BSC Sit to Stand: Min assist           General transfer comment: min A to boost to stand with cues for hand placement, standing x4 during session    Ambulation/Gait Ambulation/Gait assistance: Mod assist Gait Distance (Feet): 5 Feet Assistive device: Rolling walker (2 wheels) Gait Pattern/deviations: Step-to pattern, Decreased stride length, Decreased dorsiflexion - right, Decreased weight shift to right, Knees buckling, Antalgic Gait velocity: decr     General Gait Details: pt taking short steps with RW needing cues for RLE stabilization with hands on blocking at R knee to prevent buckling and to abduct RLE as pt adducting RLE each step almost crossing midline due to weakness   Stairs  Wheelchair Mobility     Tilt Bed    Modified Rankin (Stroke Patients Only)       Balance Overall balance assessment: Needs assistance, History of Falls Sitting-balance support: Feet supported, No upper extremity supported Sitting balance-Leahy Scale: Fair Sitting  balance - Comments: Pt sat EOB with supervision-CGA.   Standing balance support: Bilateral upper extremity supported, During functional activity, Reliant on assistive device for balance Standing balance-Leahy Scale: Poor Standing balance comment: reliant on external support                            Communication Communication Communication: Impaired Factors Affecting Communication: Hearing impaired  Cognition Arousal: Alert Behavior During Therapy: WFL for tasks assessed/performed                             Following commands: Intact      Cueing Cueing Techniques: Verbal cues, Gestural cues  Exercises Other Exercises Other Exercises: standing marching LLE x15 with focus on glute activation in stance on R to prevent R knee buckling as well as increased UE support    General Comments General comments (skin integrity, edema, etc.): VSS on RA, spouse present and supportive      Pertinent Vitals/Pain Pain Assessment Pain Assessment: Faces Faces Pain Scale: Hurts little more Pain Location: R hip Pain Descriptors / Indicators: Operative site guarding, Discomfort, Aching Pain Intervention(s): Monitored during session, Limited activity within patient's tolerance    Home Living                          Prior Function            PT Goals (current goals can now be found in the care plan section) Acute Rehab PT Goals PT Goal Formulation: With patient/family Time For Goal Achievement: 12/18/23 Progress towards PT goals: Progressing toward goals    Frequency    Min 3X/week      PT Plan      Co-evaluation              AM-PAC PT 6 Clicks Mobility   Outcome Measure  Help needed turning from your back to your side while in a flat bed without using bedrails?: Total Help needed moving from lying on your back to sitting on the side of a flat bed without using bedrails?: Total Help needed moving to and from a bed to a chair  (including a wheelchair)?: A Lot Help needed standing up from a chair using your arms (e.g., wheelchair or bedside chair)?: A Little Help needed to walk in hospital room?: Total Help needed climbing 3-5 steps with a railing? : Total 6 Click Score: 9    End of Session Equipment Utilized During Treatment: Gait belt Activity Tolerance: Patient tolerated treatment well;Patient limited by fatigue Patient left: in chair;with call bell/phone within reach;with family/visitor present Nurse Communication: Mobility status PT Visit Diagnosis: Difficulty in walking, not elsewhere classified (R26.2);Other abnormalities of gait and mobility (R26.89);Muscle weakness (generalized) (M62.81);Unsteadiness on feet (R26.81);History of falling (Z91.81);Repeated falls (R29.6)     Time: 8577-8557 PT Time Calculation (min) (ACUTE ONLY): 20 min  Charges:    $Gait Training: 8-22 mins PT General Charges $$ ACUTE PT VISIT: 1 Visit                     Odessa Morren R. PTA Acute Rehabilitation Services Office: [phone]  Therisa CHRISTELLA Boor 12/08/2023, 3:57 PM

## 2023-12-08 NOTE — H&P (Signed)
 Physical Medicine and Rehabilitation Admission H&P     CC: Functional deficits due to dR hip replacement revision due to dislocation     HPI: Sherri  [name] is an 83 year old R handed  female with PMHx significant for anxiety, dementia, GERD, rheumatoid arthritis on methotrexate  and prednisone , with osteoporosis, most likely; , HTN, multiple sclerosis, lipidemia, chronic pain, R olecranon fx (5/14), right femoral neck fx (6/17), right THA (6/19), dislocated THA (6/22), closed reduction right THA (6/23), and ORIF right olecranon (6/27).  Patient presented to [facility] on 11/26/2023 due to exposed surgical hardware and right elbow from previous elbow surgery 1 week prior. Orthopedics consulted.  X-ray showed fracture of right elbow with malposition screw and the patient underwent I&D with hardware removal, olecranon excision and triceps advancement on 11/28/2023 by Dr.Marchwiany.  Started on IV antibiotics for cellulitis, transitioned to doxy and Duricef for 4-6 weeks.  Labs in the ED showed hyponatremia 129 WBC 11.8 hemoglobin 8.1. Hemoglobin trended down to 6.8, and she received two units PRBC.    Additionally, the patient was found to have right lower extremity shortening, and an X-ray revealed a right hip dislocation. A closed hip reduction was attempted but was unsuccessful on 11/30/2023. Subsequently, the patient underwent a right total hip arthroplasty (THA) revision with head, ball, and liner change by Dr. Cherylene on 12/03/2023. Orthopedic surgery recommended aspirin  for 4 weeks. She will continue oral antibiotics for 4 to 6 weeks for cellulitis. Prior to arrival, the patient was living in a one-level home, able to live on the main level, using a walker. Currently requiring max assistance plus two for physical assistance, mod assist +2 for safety and equipment, using a rolling walker for ambulation. Therapy evaluations completed due to patient decreased functional mobility was admitted for a  comprehensive rehab program.        Pt reports LBM was today, however was like passing a truck.  Frustrated about urinary incontinence due to not strong enough to get to [facility] fast enough. Wants Purewick- explained can do at night, but not during day.    Has a painful MASD on buttocks.  [name] is very raw per daughter as well.      Pain is controlled better- and not taking IV pain meds now- spiked last night, but has been good today. Still 5-6/10 at all times. Is tolerable..    Last got pRBCs yesterday. Feeling better after transfusion. JP drain still draining bloody drainage per pt- is being emptied regularly.       Review of Systems  Constitutional:  Positive for malaise/fatigue and weight loss.  HENT:  Positive for hearing loss.   Eyes: Negative.   Respiratory: Negative.    Cardiovascular: Negative.   Gastrointestinal:  Positive for constipation. Negative for nausea and vomiting.  Genitourinary:  Positive for frequency and urgency.       Pt having incontinence and wants Purewick, due to not being able to get to [facility] on time  Musculoskeletal:  Positive for joint pain and myalgias.  Skin:  Positive for rash.       Severe MASD of buttocks  Neurological:  Positive for focal weakness and weakness.  Endo/Heme/Allergies: Negative.   Psychiatric/Behavioral:  Positive for memory loss. The patient is nervous/anxious.   All other systems reviewed and are negative.      Past Medical History:  Diagnosis Date   Anxiety      takes Valium  daily as needed   Basal cell carcinoma 01/21/1988  left nostril (MOHS), sup-right calf (CX35FU)   Basal cell carcinoma 03/22/1996    right calf (CX35FU)   Basal cell carcinoma 03/31/1995    left wing nose   Bruises easily      d/t meds   Cancer (HCC)      basal cell ca, in situ- uterine    Cataracts, bilateral      removed bilateral   Chronic back pain     Dizziness      r/t to meds   GERD (gastroesophageal reflux disease)      no meds on a  regular basis but will take Tums if needed   Headache(784.0)      r/t neck issues   History of bronchitis 6-60yrs ago   History of colon polyps      benign   HOH (hard of hearing)      wears hearing aids   Hyperlipidemia      takes Atorvastatin  on Mondays and Fridays   Hypertension     Joint pain     Joint swelling     Multiple sclerosis (HCC)     Neuromuscular disorder (HCC)      Dr. FABIENE Crete- [facility], follows M.S.   Nocturia     PONV (postoperative nausea and vomiting)      trouble urinating after surgery in 2014   Postoperative nausea and vomiting 01/11/2019   Rheumatoid arthritis (HCC)      Dr Lorrayne weekly, RA- hands- knees- feet    Rheumatoid arthritis(714.0)      Dr Dolphus takes Xeljanz  daily   Right wrist fracture     Skin cancer     Squamous cell carcinoma of skin 11/02/2001    in situ-right knee (cx72fu)   Squamous cell carcinoma of skin 11/12/2011    in situ-left forearm (CX35FU), in situ-left foot (CX35FU)   Squamous cell carcinoma of skin 01/22/2012    in situ-left lower forearm (txpbx)   Squamous cell carcinoma of skin 11/17/2012    right shin (txpbx)   Squamous cell carcinoma of skin 10/28/2013    in situ-right shoulder (CX35FU)   Squamous cell carcinoma of skin 04/28/2014    well diff-right shin (txpbx)   Squamous cell carcinoma of skin 11/02/2014    in situ-Left shin (txpbx)   Squamous cell carcinoma of skin 12/15/2014    in situ-Left hand (txpbx), bowens-left side chest (txpbx)   Squamous cell carcinoma of skin 05/09/2015    in situ-left hand (txpbx)    Squamous cell carcinoma of skin 05/07/2016    in situ-left inner shin,ant, in situ-left inner shin, post, in situ-left outer forearm, in situ-right knuckle   Squamous cell carcinoma of skin 03/1302018    in situ-left shoulder (txpbx), in situ-right inner shin (txpbx), in situ-top of left foot (txpbx), KA- left forearm (txpbx)   Squamous cell carcinoma of skin 12/02/2016    in  situ-left inner shin (txpbx)   Squamous cell carcinoma of skin 03/04/2017    in situ-left outer sup, shin (txpbx), in situ- right 2nd knuckle finger (txpbx), in situ- Left wrist (txpbx)   Squamous cell carcinoma of skin 04/06/2018    in situ-above left knee inner (txpbx)   Squamous cell carcinoma of skin 04/21/2018    in situ-right top hand (txpbx)   Squamous cell carcinoma of skin 07/08/2018    in situ-right lower inner shin (txpbx)   Squamous cell carcinoma of skin 04/27/2019    in situ- Left neck(CX35FU), In situ- right neck (  CX35FU)   Urinary retention      sees Dr.Wrenn about 2 times a yr             Past Surgical History:  Procedure Laterality Date   ABDOMINAL HYSTERECTOMY        1985   ANTERIOR CERVICAL DECOMP/DISCECTOMY FUSION N/A 04/23/2022    Procedure: Anterior Cervical Decompression/Discectomy Fusion - Cervical Seven-Thoracic One;  Surgeon: Louis Shove, MD;  Location: [facility] OR;  Service: Neurosurgery;  Laterality: N/A;  3C   APPENDECTOMY        with TAH   BACK SURGERY        several   BREAST BIOPSY Right      Results were negative   BROW LIFT Bilateral 10/02/2016    Procedure: BILATERAL LOWER LID BLEPHAROPLASTY;  Surgeon: Laurie Loyd Redhead, MD;  Location: [facility] OR;  Service: Plastics;  Laterality: Bilateral;   CARPAL TUNNEL RELEASE Right     cataracts       CERVICAL FUSION        Dr Leeann   CERVICAL FUSION   12/31/2012    Dr Leeann   CERVICAL FUSION   04/2022   CHEST TUBE INSERTION        for traumatic Pneumothorax   COLONOSCOPY   07/16/2010    normal    COLONOSCOPY W/ POLYPECTOMY   1997    negative since; Dr Debrah   ESOPHAGOGASTRODUODENOSCOPY   07/16/2010    normal   eye lid raise       EYE SURGERY Bilateral      cataracts removed - /w IOL   HARDWARE REMOVAL   08/2021   HIP CLOSED REDUCTION Right 11/16/2023    Procedure: CLOSED MANIPULATION, JOINT, HIP;  Surgeon: Ernie Cough, MD;  Location: [facility];  Service: Orthopedics;  Laterality: Right;   HIP CLOSED  REDUCTION N/A 11/30/2023    Procedure: CLOSED REDUCTION, HIP;  Surgeon: Fidel Rogue, MD;  Location: [facility];  Service: Orthopedics;  Laterality: N/A;   INCISION AND DRAINAGE OF WOUND Right 11/28/2023    Procedure: IRRIGATION AND DEBRIDEMENT WOUND; REMOVAL OF HARDWARE;  Surgeon: Edna Toribio LABOR, MD;  Location: [facility];  Service: Orthopedics;  Laterality: Right;  I & D RIGHT ELBOW   JOINT REPLACEMENT       LUMBAR FUSION        Dr Leeann   NASAL SINUS SURGERY       ORIF ELBOW FRACTURE Right 11/20/2023    Procedure: OPEN REDUCTION INTERNAL FIXATION (ORIF) ELBOW/OLECRANON FRACTURE;  Surgeon: [name] Franky SQUIBB, MD;  Location: [facility];  Service: Orthopedics;  Laterality: Right;   POSTERIOR CERVICAL FUSION/FORAMINOTOMY N/A 12/31/2012    Procedure: POSTERIOR LATERAL CERVICAL FUSION/FORAMINOTOMY LEVEL 1 CERVICAL THREE-FOUR WITH LATERAL MASS SCREWS;  Surgeon: Catalina CHRISTELLA Leeann, MD;  [facility]: [facility];  Service: Neurosurgery;  Laterality: N/A;   PTOSIS REPAIR Bilateral 10/02/2016    Procedure: INTERNAL PTOSIS REPAIR;  Surgeon: Laurie Loyd Redhead, MD;  Location: [facility];  Service: Plastics;  Laterality: Bilateral;   SKIN BIOPSY       THYROID  SURGERY        R lobe removed- 1972, has grown back - CT last done- 2018   TOTAL HIP ARTHROPLASTY   12/22/2011    Procedure: TOTAL HIP ARTHROPLASTY;  Surgeon: Dempsey JINNY Sensor, MD;  Location: [facility];  Service: Orthopedics;  Laterality: Left;   TOTAL HIP ARTHROPLASTY Right 11/12/2023    Procedure: ARTHROPLASTY, HIP, TOTAL, ANTERIOR APPROACH;  Surgeon: Fidel Rogue, MD;  Location: [facility];  Service: Orthopedics;  Laterality: Right;   TOTAL HIP REVISION Right 12/03/2023    Procedure: TOTAL HIP REVISION;  Surgeon: Fidel Rogue, MD;  Location: [facility];  Service: Orthopedics;  Laterality: Right;  Right acetabular revision Anterior approach   TOTAL SHOULDER ARTHROPLASTY       TUBAL LIGATION       UPPER GASTROINTESTINAL ENDOSCOPY   2012    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    WRIST SURGERY Left 09/16/2022    wrist/arm             Family History  Problem Relation Age of Onset   Cancer Mother          pancreatic   Diabetes Mother     Pancreatic cancer Mother     Heart disease Father          Rheumatic   Cancer Father          ? stomach   Stomach cancer Father     Cancer Sister          stomach   Stomach cancer Sister     Cancer Maternal Aunt          X 4; ? primary   Heart disease Paternal Aunt     Cancer Maternal Grandmother          cervical   Colon cancer Other          Aunts   Multiple sclerosis Daughter     Hypertension Neg Hx     Stroke Neg Hx     Esophageal cancer Neg Hx     Rectal cancer Neg Hx          Social History:  reports that she quit smoking about 42 years ago. Her smoking use included cigarettes. She started smoking about 62 years ago. She has a 50 pack-year smoking history. She has never been exposed to tobacco smoke. She has never used smokeless tobacco. She reports current alcohol  use of about 14.0 standard drinks of alcohol  per week. She reports that she does not use drugs. Allergies:  Allergies       Allergies  Allergen Reactions   Darvon [Propoxyphene Hcl] Shortness Of Breath      ? Dose Related ? Lowered respirations greatly   Demerol [Meperidine] Shortness Of Breath and Other (See Comments)      Respiratory Distress   Penicillins Hives and Other (See Comments)      Has patient had a PCN reaction causing immediate rash, facial/tongue/throat swelling, SOB or lightheadedness with hypotension: No SEVERE RASH INVOLVING MUCUS MEMBRANES or SKIN NECROSIS: #  #  #  YES  #  #  #  Has patient had a PCN reaction that required hospitalization No Has patient had a PCN reaction occurring within the last 10 years: No.     Imuran [Azathioprine] Other (See Comments)      Weakness   Sulfa  Antibiotics Nausea Only and Other (See Comments)      States had severe indigestion and heartburn and nausea and had to stop taking   Arava  [Leflunomide] Other (See Comments)      Excessive weight gain   Lipitor [Atorvastatin ] Nausea And Vomiting      Currently taking 2 times weekly - able to tolerate low dose    Vytorin [Ezetimibe-Simvastatin] Nausea And Vomiting            Medications Prior to Admission  Medication Sig Dispense Refill  acetaminophen  (TYLENOL ) 325 MG tablet Take 2 tablets (650 mg total) by mouth every 6 (six) hours as needed for mild pain (pain score 1-3) or fever (or Fever >/= 101). 20 tablet 0   aspirin  (ASPIRIN  CHILDRENS) 81 MG chewable tablet Chew 1 tablet (81 mg total) by mouth 2 (two) times daily with a meal. 90 tablet 0   atorvastatin  (LIPITOR) 10 MG tablet TAKE 1 TAB ON MONDAYS AND FRIDAY 26 tablet 4   Biotin  5000 MCG TABS Take 10,000 mcg by mouth in the morning.       Carboxymethylcellul-Glycerin  (LUBRICATING EYE DROPS OP) Place 1 drop into both eyes daily as needed (dry eyes).       carvedilol  (COREG ) 3.125 MG tablet Take 1 tablet (3.125 mg total) by mouth 2 (two) times daily with a meal. 180 tablet 3   cephALEXin  (KEFLEX ) 500 MG capsule Take 500 mg by mouth 3 (three) times daily. For 10 days       chlorhexidine  (HIBICLENS ) 4 % external liquid Apply 15 mLs (1 Application total) topically as directed for 30 doses. Use as directed daily for 5 days every other week for 6 weeks. 946 mL 1   cholecalciferol  (VITAMIN D ) 25 MCG (1000 UNIT) tablet Take 1,000 Units by mouth in the morning.       cyclobenzaprine  (FLEXERIL ) 10 MG tablet TAKE 1 TABLET BY MOUTH THREE TIMES A DAY AS NEEDED FOR MUSCLE SPASM (Patient taking differently: Take 10 mg by mouth at bedtime.) 90 tablet 2   denosumab  (PROLIA ) 60 MG/ML SOSY injection Inject 60 mg into the skin every 6 (six) months. Deliver to rheum: [address], [place], [place] [address]. Appt on 07/23/2022 1 mL 0   diazepam  (VALIUM ) 5 MG tablet TAKE 1 TABLET BY MOUTH AT BEDTIME AS NEEDED FOR MUSCLE SPASMS. (Patient taking differently: Take 5 mg by mouth daily as needed  for muscle spasms.) 30 tablet 5   docusate sodium  (COLACE) 100 MG capsule Take 1 capsule (100 mg total) by mouth daily. 10 capsule 0   DULoxetine  (CYMBALTA ) 20 MG capsule Take 20 mg by mouth at bedtime.       folic acid  (FOLVITE ) 1 MG tablet Take 1 tablet (1 mg total) by mouth daily. 90 tablet 3   furosemide  (LASIX ) 20 MG tablet TAKE 1 TABLET BY MOUTH EVERY DAY 90 tablet 1   methotrexate  (RHEUMATREX) 2.5 MG tablet Take 5 tablets (12.5 mg total) by mouth once a week. Caution:Chemotherapy. Protect from light. 60 tablet 0   mupirocin  ointment (BACTROBAN ) 2 % Place 1 Application into the nose 2 (two) times daily for 60 doses. Use as directed 2 times daily for 5 days every other week for 6 weeks. 60 g 0   oxybutynin  (DITROPAN ) 5 MG tablet Take 5 mg by mouth daily.       pantoprazole  (PROTONIX ) 40 MG tablet Take 1 tablet (40 mg total) by mouth 2 (two) times daily before a meal. 60 tablet 5   polyethylene glycol (MIRALAX  / GLYCOLAX ) 17 g packet Take 17 g by mouth daily as needed for mild constipation. 14 each 0   predniSONE  (DELTASONE ) 5 MG tablet TAKE 1 TABLET BY MOUTH EVERY DAY IN THE MORNING 90 tablet 0   senna (SENOKOT) 8.6 MG tablet Take 1 tablet by mouth 2 (two) times daily.       traMADol  (ULTRAM ) 50 MG tablet Take 25-50 mg by mouth every 8 (eight) hours as needed for moderate pain (pain score 4-6).  bisacodyl  (DULCOLAX) 10 MG suppository Place 1 suppository (10 mg total) rectally daily as needed for moderate constipation. 12 suppository 0   diclofenac sodium (VOLTAREN) 1 % GEL APPLY 3 GRAMS TO 3 LARGE JOINTS 3 TIMES A DAY AS NEEDED (Patient not taking: Reported on 11/27/2023) 100 g 0   [Paused] Upadacitinib  ER ([name] ) 15 MG TB24 Take 15 mg by mouth daily. (Patient not taking: Reported on 11/27/2023)                  Home:   Functional History:   Functional Status:  Mobility:   ADL:   Cognition:   Physical Exam: There were no vitals taken for this visit. Physical  Exam Constitutional:      General: She is not in acute distress.    Appearance: She is ill-appearing.     Comments: Pt sitting up in bedside chair; husband and daughter who's retired [employer] fundraiser at bedside; NAD  HENT:     Head: Normocephalic.     Comments: Has a rash type bruising on L side of face consistent with the intubation for last surgery; skin appears pale    Right Ear: External ear normal.     Left Ear: External ear normal.     Ears:     Comments: HOH    Nose: Nose normal. No congestion.     Mouth/Throat:     Mouth: Mucous membranes are dry.     Pharynx: Oropharynx is clear. No oropharyngeal exudate.  Eyes:     General:        Right eye: No discharge.        Left eye: No discharge.     Extraocular Movements: Extraocular movements intact.  Cardiovascular:     Rate and Rhythm: Normal rate and regular rhythm.     Heart sounds: Normal heart sounds. No murmur heard.    No gallop.  Pulmonary:     Effort: Pulmonary effort is normal. No respiratory distress.     Breath sounds: Normal breath sounds. No wheezing, rhonchi or rales.  Abdominal:     Palpations: Abdomen is soft.     Comments: Slightly hypoactive; NT  Genitourinary:    Comments: Has purewick in place- medium amber urine Severely bruised/purple R labia Significant MASD and raw buttocks and genitals/perineum Musculoskeletal:     Cervical back: Neck supple.     Comments: RUE in large cast wrapped with ACE wrap R leg has drain with 20+ cc of sanguinous drainage in bulb and VAC also has drainage in it RUE- 4-4+/5 R deltoids and can wiggle fingers LUE 4+/5 throughout RLE- HF 2/5;  hard to bend knee, but at least 3/5;DF/PF 4/5 LLE- 5-/5 throughout  Skin:    General: Skin is warm and dry.     Comments: Raw oozing buttocks/perineum and significant MASD  Bruising in all 4 extremities- deep purple   Neurological:     Mental Status: She is alert.     Comments: Ox3, however decrease in memory But decent problem solving  skills Intact in UEs and LLE sensation to light touch Decreased throughout RLE from R hip to R toes  Psychiatric:     Comments: Brighter affect, laughing some with daughter/husband       Lab Results Last 48 Hours        Results for orders placed or performed during the hospital encounter of 11/26/23 (from the past 48 hours)  Basic metabolic panel with GFR     Status: Abnormal  Collection Time: 12/07/23  6:53 AM  Result Value Ref Range    Sodium 135 135 - 145 mmol/L    Potassium 3.7 3.5 - 5.1 mmol/L    Chloride 102 98 - 111 mmol/L    CO2 25 22 - 32 mmol/L    Glucose, Bld 84 70 - 99 mg/dL      Comment: Glucose reference range applies only to samples taken after fasting for at least 8 hours.    BUN <5 (L) 8 - 23 mg/dL    Creatinine, Ser 9.32 0.44 - 1.00 mg/dL    Calcium  8.1 (L) 8.9 - 10.3 mg/dL    GFR, Estimated >39 >39 mL/min      Comment: (NOTE) Calculated using the CKD-EPI Creatinine Equation (2021)      Anion gap 8 5 - 15      Comment: Performed at [facility], [address], [place], [place] [address]  CBC     Status: Abnormal    Collection Time: 12/07/23  6:53 AM  Result Value Ref Range    WBC 7.3 4.0 - 10.5 K/uL    RBC 2.43 (L) 3.87 - 5.11 MIL/uL    Hemoglobin 7.9 (L) 12.0 - 15.0 g/dL    HCT 75.5 (L) 63.9 - 46.0 %    MCV 100.4 (H) 80.0 - 100.0 fL    MCH 32.5 26.0 - 34.0 pg    MCHC 32.4 30.0 - 36.0 g/dL    RDW 81.3 (H) 88.4 - 15.5 %    Platelets 292 150 - 400 K/uL    nRBC 0.6 (H) 0.0 - 0.2 %      Comment: Performed at [facility], [address] [place], [place] [address]  Hemoglobin and hematocrit, blood     Status: Abnormal    Collection Time: 12/07/23 12:46 PM  Result Value Ref Range    Hemoglobin 7.5 (L) 12.0 - 15.0 g/dL    HCT 77.2 (L) 63.9 - 46.0 %      Comment: Performed at [facility], [address], [place], [place] [address]  Prepare RBC (crossmatch)     Status: None    Collection Time: 12/07/23  4:30 PM  Result Value Ref  Range    Order Confirmation          ORDER PROCESSED BY BLOOD BANK Performed at [facility], [address], [place], [place] [address]    CBC     Status: Abnormal    Collection Time: 12/08/23  9:05 AM  Result Value Ref Range    WBC 7.4 4.0 - 10.5 K/uL    RBC 3.28 (L) 3.87 - 5.11 MIL/uL    Hemoglobin 10.5 (L) 12.0 - 15.0 g/dL      Comment: REPEATED TO VERIFY POST TRANSFUSION SPECIMEN      HCT 31.2 (L) 36.0 - 46.0 %    MCV 95.1 80.0 - 100.0 fL    MCH 32.0 26.0 - 34.0 pg    MCHC 33.7 30.0 - 36.0 g/dL    RDW 80.5 (H) 88.4 - 15.5 %    Platelets 330 150 - 400 K/uL      Comment: REPEATED TO VERIFY    nRBC 0.5 (H) 0.0 - 0.2 %      Comment: Performed at [facility], [address], [place], [place] [address]  Magnesium      Status: None    Collection Time: 12/08/23  9:05 AM  Result Value Ref Range  Magnesium  1.8 1.7 - 2.4 mg/dL      Comment: Performed at [facility], [address] [place], [place] [address]  Phosphorus     Status: None    Collection Time: 12/08/23  9:05 AM  Result Value Ref Range    Phosphorus 2.8 2.5 - 4.6 mg/dL      Comment: Performed at [facility], [address] [place], [place] [address]  Basic metabolic panel with GFR     Status: Abnormal    Collection Time: 12/08/23  9:05 AM  Result Value Ref Range    Sodium 137 135 - 145 mmol/L    Potassium 3.2 (L) 3.5 - 5.1 mmol/L    Chloride 102 98 - 111 mmol/L    CO2 23 22 - 32 mmol/L    Glucose, Bld 81 70 - 99 mg/dL      Comment: Glucose reference range applies only to samples taken after fasting for at least 8 hours.    BUN <5 (L) 8 - 23 mg/dL    Creatinine, Ser 9.31 0.44 - 1.00 mg/dL    Calcium  8.0 (L) 8.9 - 10.3 mg/dL    GFR, Estimated >39 >39 mL/min      Comment: (NOTE) Calculated using the CKD-EPI Creatinine Equation (2021)      Anion gap 12 5 - 15      Comment: Performed at [facility], [address] [place], [place] [address]    *Note: Due to a large number of  results and/or encounters for the requested time period, some results have not been displayed. A complete set of results can be found in Results Review.      Imaging Results (Last 48 hours)  No results found.         There were no vitals taken for this visit.   Medical Problem List and Plan: 1. Functional deficits secondary to R 2nd hip dislocation s/p R total hip revision surgery WBAT on RLE             -patient may not  shower due to VAC             -ELOS/Goals: 12-14 days- Superviison to min A WBAT with walker RLE-posterior hip precautions Partial weightbearing RUE okay to use walker per Dr. Edna- s/p R elbow hardware removal  2.  Antithrombotics: -DVT/anticoagulation:  Mechanical:  Antiembolism stockings, knee (TED hose) Bilateral lower extremities Sequential compression devices, below knee Bilateral lower extremities             -antiplatelet therapy: Aspirin  81 mg held due to bleeding risk  3. Pain Management: Oxycodone  and Tylenol  as needed             - Continue Cymbalta  20 mg daily and Flexeril  10 mg at bedtime 4. Mood/Behavior/Sleep: Therapy evaluations completed due to patient decreased functional mobility was admitted for a comprehensive rehab program.              -antipsychotic agents: N/A 5. Neuropsych/cognition: This patient is intermittently capable of making decisions on her own behalf. 6. Skin/Wound Care: Routine pressure relief measures             -JP drain-per Ortho will discontinue when output < 10 cc per shift             -Continue negative pressure at 75 mmHg--can be exchanged to Prevena  - MASD-7/15 start Diflucan  200 mg then 100 mg x 6 days -Gerhardt's              -  Diffuse ecchymoses- Platelets 330 CTM Routine labs, follow-up with derm/hematology outpatient. 7. Fluids/Electrolytes/Nutrition: Monitor I's and O's, recheck labs tomorrow             -Diet Heart Healthy-- SLP recc nectar thick- pt not agreeable  -Ensure High protein + Zinc  and vitamin  C -constipation-Colace -Overactive Bladder- cont Oxybutynin   8. Hypertension: Continue Coreg  3.125 mg             -monitor blood pressures per protocol  9. Rheumatoid arthritis-continue home prednisone  5 mg daily and methotrexate - methotrexate  just restarted.  10. Microcytic anemia/acute blood loss anemia: monitor for signs of bleeding recheck CBC in a.m   -normal folate & B12, iron study suggestive of AOC, status post 3 units PRBC. 11. Severe MASD_ will try Diflucan  200 mg x1 and 100 mg daily x 6 days and monitor skin 12. Constipation- needs colace added 13. IV doesn't look great- will remove         Brandi L Leak, NP 12/08/2023     I have personally performed a face to face diagnostic evaluation of this patient and formulated the key components of the plan.  Additionally, I have personally reviewed laboratory data, imaging studies, as well as relevant notes and concur with the physician assistant's documentation above.   The patient's status has not changed from the original H&P.  Any changes in documentation from the acute care chart have been noted above.

## 2023-12-09 ENCOUNTER — Other Ambulatory Visit: Payer: Self-pay

## 2023-12-09 ENCOUNTER — Encounter (HOSPITAL_COMMUNITY): Payer: Self-pay | Admitting: Physical Medicine & Rehabilitation

## 2023-12-09 DIAGNOSIS — Z96641 Presence of right artificial hip joint: Secondary | ICD-10-CM | POA: Diagnosis not present

## 2023-12-09 DIAGNOSIS — I10 Essential (primary) hypertension: Secondary | ICD-10-CM | POA: Diagnosis not present

## 2023-12-09 DIAGNOSIS — K5900 Constipation, unspecified: Secondary | ICD-10-CM

## 2023-12-09 DIAGNOSIS — D509 Iron deficiency anemia, unspecified: Secondary | ICD-10-CM

## 2023-12-09 LAB — COMPREHENSIVE METABOLIC PANEL WITH GFR
ALT: 10 U/L (ref 0–44)
AST: 18 U/L (ref 15–41)
Albumin: 2.1 g/dL — ABNORMAL LOW (ref 3.5–5.0)
Alkaline Phosphatase: 67 U/L (ref 38–126)
Anion gap: 11 (ref 5–15)
BUN: 5 mg/dL — ABNORMAL LOW (ref 8–23)
CO2: 25 mmol/L (ref 22–32)
Calcium: 8.2 mg/dL — ABNORMAL LOW (ref 8.9–10.3)
Chloride: 103 mmol/L (ref 98–111)
Creatinine, Ser: 0.58 mg/dL (ref 0.44–1.00)
GFR, Estimated: >60 mL/min (ref >60)
Glucose, Bld: 80 mg/dL (ref 70–99)
Potassium: 3.5 mmol/L (ref 3.5–5.1)
Sodium: 139 mmol/L (ref 135–145)
Total Bilirubin: 1 mg/dL (ref 0.0–1.2)
Total Protein: 4.6 g/dL — ABNORMAL LOW (ref 6.5–8.1)

## 2023-12-09 NOTE — Plan of Care (Signed)
  Problem: RH Balance Goal: LTG: Patient will maintain dynamic sitting balance (OT) Description: LTG:  Patient will maintain dynamic sitting balance with assistance during activities of daily living (OT) Outcome: Progressing Flowsheets (Taken 12/09/2023 1315) LTG: Pt will maintain dynamic sitting balance during ADLs with: Supervision/Verbal cueing Goal: LTG Patient will maintain dynamic standing with ADLs (OT) Description: LTG:  Patient will maintain dynamic standing balance with assist during activities of daily living (OT)  Outcome: Progressing Flowsheets (Taken 12/09/2023 1315) LTG: Pt will maintain dynamic standing balance during ADLs with: Minimal Assistance - Patient > 75%   Problem: Sit to Stand Goal: LTG:  Patient will perform sit to stand in prep for activites of daily living with assistance level (OT) Description: LTG:  Patient will perform sit to stand in prep for activites of daily living with assistance level (OT) Outcome: Progressing Flowsheets (Taken 12/09/2023 1315) LTG: PT will perform sit to stand in prep for activites of daily living with assistance level: Supervision/Verbal cueing   Problem: RH Eating Goal: LTG Patient will perform eating w/assist, cues/equip (OT) Description: LTG: Patient will perform eating with assist, with/without cues using equipment (OT) Outcome: Progressing Flowsheets (Taken 12/09/2023 1315) LTG: Pt will perform eating with assistance level of: Independent with assistive device    Problem: RH Grooming Goal: LTG Patient will perform grooming w/assist,cues/equip (OT) Description: LTG: Patient will perform grooming with assist, with/without cues using equipment (OT) Outcome: Progressing Flowsheets (Taken 12/09/2023 1315) LTG: Pt will perform grooming with assistance level of: Independent with assistive device    Problem: RH Bathing Goal: LTG Patient will bathe all body parts with assist levels (OT) Description: LTG: Patient will bathe all body  parts with assist levels (OT) Outcome: Progressing Flowsheets (Taken 12/09/2023 1315) LTG: Pt will perform bathing with assistance level/cueing: Minimal Assistance - Patient > 75%   Problem: RH Dressing Goal: LTG Patient will perform upper body dressing (OT) Description: LTG Patient will perform upper body dressing with assist, with/without cues (OT). Outcome: Progressing Flowsheets (Taken 12/09/2023 1315) LTG: Pt will perform upper body dressing with assistance level of: Independent with assistive device Goal: LTG Patient will perform lower body dressing w/assist (OT) Description: LTG: Patient will perform lower body dressing with assist, with/without cues in positioning using equipment (OT) Outcome: Progressing Flowsheets (Taken 12/09/2023 1315) LTG: Pt will perform lower body dressing with assistance level of: Minimal Assistance - Patient > 75%   Problem: RH Toileting Goal: LTG Patient will perform toileting task (3/3 steps) with assistance level (OT) Description: LTG: Patient will perform toileting task (3/3 steps) with assistance level (OT)  Outcome: Progressing Flowsheets (Taken 12/09/2023 1315) LTG: Pt will perform toileting task (3/3 steps) with assistance level: Contact Guard/Touching assist   Problem: RH Functional Use of Upper Extremity Goal: LTG Patient will use RT/LT upper extremity as a (OT) Description: LTG: Patient will use right/left upper extremity as a stabilizer/gross assist/diminished/nondominant/dominant level with assist, with/without cues during functional activity (OT) Outcome: Progressing Flowsheets (Taken 12/09/2023 1315) LTG: Use of upper extremity in functional activities: RUE as a stabilizer LTG: Pt will use upper extremity in functional activity with assistance level of: Independent with assistive device   Problem: RH Toilet Transfers Goal: LTG Patient will perform toilet transfers w/assist (OT) Description: LTG: Patient will perform toilet transfers with  assist, with/without cues using equipment (OT) Outcome: Progressing Flowsheets (Taken 12/09/2023 1315) LTG: Pt will perform toilet transfers with assistance level of: Minimal Assistance - Patient > 75%

## 2023-12-09 NOTE — Progress Notes (Addendum)
 PROGRESS NOTE   Subjective/Complaints: Patient reports she is doing well this morning.  Reports she is having issues with her hearing aid needs to be repaired.  Reports she does have pain about 5 out of 10, she has severe chronic pain due to her rheumatoid arthritis and she feels that pain is currently controlled.  Patient reports she slept okay last night.  Patient reports she was previously constipated but had multiple bowel movements yesterday.  ROS: Patient denies fever, new vision changes, nausea, vomiting, diarrhea,  shortness of breath or chest pain, headache, or mood change. + chronic joint pain    Objective:   No results found. Recent Labs    12/07/23 0653 12/07/23 1246 12/08/23 0905  WBC 7.3  --  7.4  HGB 7.9* 7.5* 10.5*  HCT 24.4* 22.7* 31.2*  PLT 292  --  330   Recent Labs    12/08/23 0905 12/09/23 0427  NA 137 139  K 3.2* 3.5  CL 102 103  CO2 23 25  GLUCOSE 81 80  BUN <5* 5*  CREATININE 0.68 0.58  CALCIUM  8.0* 8.2*    Intake/Output Summary (Last 24 hours) at 12/09/2023 1011 Last data filed at 12/09/2023 0806 Gross per 24 hour  Intake 480 ml  Output 150 ml  Net 330 ml        Physical Exam: Vital Signs Blood pressure (!) 157/81, pulse 84, temperature 97.8 F (36.6 C), temperature source Oral, resp. rate 16, SpO2 97%.  General: No apparent distress, sitting in wheelchair working with therapy in the room HEENT: Head is normocephalic, atraumatic, sclera anicteric, oral mucosa overall dry Neck: Supple without JVD or lymphadenopathy Heart: Reg rate and rhythm. No murmurs rubs or gallops Chest: CTA bilaterally without wheezes, rales, or rhonchi; no distress Abdomen: Soft, non-tender, non-distended, bowel sounds positive. Extremities: No clubbing, cyanosis, or edema.  Psych: Pt's affect is appropriate. Pt is cooperative Skin: Has a rash type bruising on L side of face consistent with the intubation  for last surgery; skin appears pale, ecchymosis noted on chest wall and all 4 extremities   Severely bruised/purple R labia Significant MASD and raw buttocks and genitals/perineum- not checked today Neuro:   AAOx3, sensation intact in all 4 extremities to LT, hard of hearing   MSK:  Arthritic changes noted in her hands RUE in large cast wrapped with ACE wrap R leg has drain with small amount sanguinous drainage in bulb and VAC also has small amount drainage in it RUE- 4-4+/5 R deltoids and can wiggle fingers LUE 4+/5 throughout RLE- HF 2/5;  hard to bend knee, but at least 3/5;DF/PF 4/5 LLE- 5-/5 throughout     Assessment/Plan: 1. Functional deficits which require 3+ hours per day of interdisciplinary therapy in a comprehensive inpatient rehab setting. Physiatrist is providing close team supervision and 24 hour management of active medical problems listed below. Physiatrist and rehab team continue to assess barriers to discharge/monitor patient progress toward functional and medical goals  Care Tool:  Bathing              Bathing assist       Upper Body Dressing/Undressing Upper body dressing  Upper body assist      Lower Body Dressing/Undressing Lower body dressing            Lower body assist       Toileting Toileting    Toileting assist       Transfers Chair/bed transfer  Transfers assist           Locomotion Ambulation   Ambulation assist              Walk 10 feet activity   Assist           Walk 50 feet activity   Assist           Walk 150 feet activity   Assist           Walk 10 feet on uneven surface  activity   Assist           Wheelchair     Assist               Wheelchair 50 feet with 2 turns activity    Assist            Wheelchair 150 feet activity     Assist          Blood pressure (!) 157/81, pulse 84, temperature 97.8 F (36.6 C), temperature  source Oral, resp. rate 16, SpO2 97%.  Medical Problem List and Plan: 1. 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 She also had R elbow hardware removal and olecranon excision and triceps advancement 11/28/23.             -patient may not  shower due to VAC             -ELOS/Goals: 12-14 days- Superviison to min A WBAT with walker RLE-posterior hip precautions Partial weightbearing RUE okay to use walker per Dr. Edna- s/p R elbow hardware removal - Message ortho to confirm wt bearing use with walker- OK for weightbearing but no bending elbow  -Team conference today please see physician documentation under team conference tab, met with team  to discuss problems,progress, and goals. Formulized individual treatment plan based on medical history, underlying problem and comorbidities.   -Continue CIR  2.  Antithrombotics: -DVT/anticoagulation:  Mechanical:  Antiembolism stockings, knee (TED hose) Bilateral lower extremities Sequential compression devices, below knee Bilateral lower extremities             -antiplatelet therapy: Aspirin  81 mg held due to bleeding risk  3. Pain Management: Oxycodone  and Tylenol  as needed             - Continue Cymbalta  20 mg daily and Flexeril  10 mg at bedtime 4. Mood/Behavior/Sleep: Therapy evaluations completed due to patient decreased functional mobility was admitted for a comprehensive rehab program.              -antipsychotic agents: N/A 5. Neuropsych/cognition: This patient is intermittently capable of making decisions on her own behalf. 6. Skin/Wound Care: Routine pressure relief measures             -JP drain-per Ortho will discontinue when output < 10 cc per shift             -Continue negative pressure at 75 mmHg--can be exchanged to Prevena  - MASD-7/15 start Diflucan  200 mg then 100 mg x 6 days -Gerhardt's              -Diffuse ecchymoses- Platelets 330 CTM Routine labs,  follow-up with  derm/hematology outpatient. 7. Fluids/Electrolytes/Nutrition: Monitor I's and O's, recheck labs tomorrow             -Diet Heart Healthy-- SLP recc nectar thick- pt not agreeable  -Ensure High protein + Zinc  and vitamin C  -Overactive Bladder- cont Oxybutynin   8. Hypertension: Continue Coreg  3.125 mg             - BP intermittently elevated, continue to monitor trend for now      12/09/2023    4:50 AM 12/08/2023    7:18 PM 12/08/2023    5:00 PM  Vitals with BMI  Systolic 157 151 871  Diastolic 81 80 69  Pulse 84 93 95    9. Rheumatoid arthritis-continue home prednisone  5 mg daily and methotrexate - methotrexate  just restarted.  10. Microcytic anemia/acute blood loss anemia: monitor for signs of bleeding recheck CBC in a.m   -normal folate & B12, iron study suggestive of AOC, status post 3 units PRBC. -7/16 HGB 10.5, continue to monitor  11. Severe MASD_ will try Diflucan  200 mg x1 and 100 mg daily x 6 days and monitor skin 12. Constipation- needs colace added 13. IV doesn't look great- will remove  -order placed 14.  Constipation  - 7/16 reports improvement after multiple bowel movements yesterday 15. Dysphagia, chronic   - has been refusing modification/nectar thick liquds    LOS: 1 days A FACE TO FACE EVALUATION WAS PERFORMED  Murray Collier 12/09/2023, 10:11 AM

## 2023-12-09 NOTE — Progress Notes (Signed)
 Inpatient Rehabilitation Admission Medication Review by a Pharmacist  A complete drug regimen review was completed for this patient to identify any potential clinically significant medication issues.  High Risk Drug Classes Is patient taking? Indication by Medication  Antipsychotic Yes, as an intravenous medication Compazine  - nausea  Anticoagulant No   Antibiotic Yes Cefadroxil - cellulitis (skin flora coverage) Fluconazole  for total 6 days- MASD  Opioid Yes Oxycodone  - pain  Antiplatelet No   Hypoglycemics/insulin No   Vasoactive Medication Yes Coreg  - HTN  Chemotherapy No   Other Yes Acetaminophen - pain Atorvastatin  - HLD Cyclobenzaprine , diazepam  duloxetine -muscle spasms/ pain Diphenhydramine -itching Methotrexate , prednisone - Rheumatoid arthritis  Oxybutynin -overactive bladder Pantoprazole - GERD Senna, bisacodyl , fleets enema - bowel regimen, constipation Trazodone  - sleep     Type of Medication Issue Identified Description of Issue Recommendation(s)  Drug Interaction(s) (clinically significant)     Duplicate Therapy     Allergy     No Medication Administration End Date     Incorrect Dose     Additional Drug Therapy Needed     Significant med changes from prior encounter (inform family/care partners about these prior to discharge).  Aspirin , tramadol  were discontinued on [facility] Acute care admission/discharge 12/08/23. Communicate to patient /family/ caregiver prior to discharge.  Other       Clinically significant medication issues were identified that warrant physician communication and completion of prescribed/recommended actions by midnight of the next day:  No  Name of provider notified for urgent issues identified:   Provider Method of Notification:    Pharmacist comments:   Time spent performing this drug regimen review (minutes):     Sherri [name] 12/09/2023 3:52 PM

## 2023-12-09 NOTE — Progress Notes (Signed)
 Inpatient Rehabilitation  Patient information reviewed and entered into eRehab system by [facility]. Karen Kays., CCC/SLP, PPS Coordinator.  Information including medical coding, functional ability and quality indicators will be reviewed and updated through discharge.

## 2023-12-09 NOTE — Plan of Care (Signed)
  Problem: RH Balance Goal: LTG Patient will maintain dynamic sitting balance (PT) Description: LTG:  Patient will maintain dynamic sitting balance with assistance during mobility activities (PT) Flowsheets (Taken 12/09/2023 1532) LTG: Pt will maintain dynamic sitting balance during mobility activities with:: Supervision/Verbal cueing Goal: LTG Patient will maintain dynamic standing balance (PT) Description: LTG:  Patient will maintain dynamic standing balance with assistance during mobility activities (PT) Flowsheets (Taken 12/09/2023 1532) LTG: Pt will maintain dynamic standing balance during mobility activities with:: Minimal Assistance - Patient > 75%   Problem: Sit to Stand Goal: LTG:  Patient will perform sit to stand with assistance level (PT) Description: LTG:  Patient will perform sit to stand with assistance level (PT) Flowsheets (Taken 12/09/2023 1532) LTG: PT will perform sit to stand in preparation for functional mobility with assistance level: Supervision/Verbal cueing   Problem: RH Bed Mobility Goal: LTG Patient will perform bed mobility with assist (PT) Description: LTG: Patient will perform bed mobility with assistance, with/without cues (PT). Flowsheets (Taken 12/09/2023 1532) LTG: Pt will perform bed mobility with assistance level of: Supervision/Verbal cueing   Problem: RH Bed to Chair Transfers Goal: LTG Patient will perform bed/chair transfers w/assist (PT) Description: LTG: Patient will perform bed to chair transfers with assistance (PT). Flowsheets (Taken 12/09/2023 1532) LTG: Pt will perform Bed to Chair Transfers with assistance level: Supervision/Verbal cueing   Problem: RH Car Transfers Goal: LTG Patient will perform car transfers with assist (PT) Description: LTG: Patient will perform car transfers with assistance (PT). Flowsheets (Taken 12/09/2023 1532) LTG: Pt will perform car transfers with assist:: Minimal Assistance - Patient > 75%   Problem: RH  Ambulation Goal: LTG Patient will ambulate in controlled environment (PT) Description: LTG: Patient will ambulate in a controlled environment, # of feet with assistance (PT). Flowsheets (Taken 12/09/2023 1532) LTG: Pt will ambulate in controlled environ  assist needed:: Minimal Assistance - Patient > 75% LTG: Ambulation distance in controlled environment: 10 feet with LRAD

## 2023-12-09 NOTE — Progress Notes (Deleted)
 Office Visit Note  Patient: Sherri [name]             Date of Birth: 05/23/1941           MRN: [id number]             PCP: [name] PARAS, MD Referring: [name] PARAS, MD Visit Date: 12/23/2023 Occupation: @GUAROCC @  Subjective:  No chief complaint on file.   History of Present Illness: Sherri  [name] is a 83 y.o. female ***   Patient was discharged from the hospital on November 08, 2023 after right elbow visible hardware with wound dehiscence and cellulitis.  She was treated with 2 weeks of IV antibiotics.  She was discharged on aspirin .  She also had superior dislocation of the right hip arthroplasty with closed reduction on July 7.    Activities of Daily Living:  Patient reports morning stiffness for *** {minute/hour:19697}.   Patient {ACTIONS;DENIES/REPORTS:21021675::Denies} nocturnal pain.  Difficulty dressing/grooming: {ACTIONS;DENIES/REPORTS:21021675::Denies} Difficulty climbing stairs: {ACTIONS;DENIES/REPORTS:21021675::Denies} Difficulty getting out of chair: {ACTIONS;DENIES/REPORTS:21021675::Denies} Difficulty using hands for taps, buttons, cutlery, and/or writing: {ACTIONS;DENIES/REPORTS:21021675::Denies}  No Rheumatology ROS completed.   PMFS History:  Patient Active Problem List   Diagnosis Date Noted   Debility 12/08/2023   History of revision of total replacement of right hip joint 12/08/2023   Postoperative wound infection 12/08/2023   Closed fracture of right elbow 12/08/2023   Wound dehiscence 11/27/2023   Macrocytic anemia 11/27/2023   Cellulitis of right elbow 11/27/2023   Lightheadedness 11/10/2023   Physical deconditioning 11/10/2023   Parotid swelling 11/10/2023   Infected laceration of skin 11/10/2023   Skin tear of elbow without complication, right, initial encounter 11/10/2023   Hip fracture (HCC) 11/10/2023   Sepsis due to cellulitis (HCC) 10/03/2023   Cellulitis 10/03/2023   Pressure injury of skin 10/03/2023   Hyponatremia  10/02/2023   Fall at home, initial encounter 10/02/2023   Cellulitis of right lower extremity 10/02/2023   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   Cervical spondylosis with myelopathy and radiculopathy 04/23/2022   Constipation 03/04/2022   Upper back pain on left side 03/04/2022   Lumbar vertebral fracture, pathologic 09/20/2021   Degenerative spondylolisthesis 08/19/2021   Acute left lumbar radiculopathy 07/05/2021   Senile purpura (HCC) 12/19/2020   Restless leg 02/02/2020   Bilateral leg edema 01/11/2020   Cervical radiculopathy at C8 06/28/2019   Carpal tunnel syndrome on both sides 06/28/2019   Right sided sciatica 04/14/2018   Trochanteric bursitis of right hip 04/14/2018   H/O cervical spine surgery 11/10/2017   Dysphagia 10/26/2017   Gastroesophageal reflux disease 08/06/2017   Easy bruising 08/06/2017   Mid back pain 03/26/2017   Submandibular gland swelling 01/08/2017   High risk medication use 09/02/2016   Piriformis muscle pain 09/02/2016   Right hip pain 04/08/2016   Right knee pain 02/25/2016   Numbness and tingling in left hand 11/14/2014   Biceps tendon tear 10/10/2014   Insomnia 10/10/2014   Gait disturbance 06/13/2014   OP (osteoporosis) 01/18/2014   Other long term (current) drug therapy 01/18/2014   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 01/01/2013   History of colonic polyps 07/03/2010   Nocturia 05/28/2010   THYROID  NODULE 05/17/2008   Multiple sclerosis (HCC) 05/17/2008   Other fatigue 03/30/2007   Hyperlipidemia 08/20/2006   Essential hypertension 08/20/2006   Rheumatoid arthritis (HCC) 08/20/2006   Osteoarthritis 08/20/2006   CERVICAL CANCER, HX OF 08/20/2006   GASTRIC ULCER, HX OF 08/20/2006  Past Medical History:  Diagnosis Date   Anxiety    takes Valium  daily as needed   Basal cell carcinoma 01/21/1988   left nostril (MOHS), sup-right calf (CX35FU)   Basal cell carcinoma 03/22/1996   right calf (CX35FU)    Basal cell carcinoma 03/31/1995   left wing nose   Bruises easily    d/t meds   Cancer (HCC)    basal cell ca, in situ- uterine    Cataracts, bilateral    removed bilateral   Chronic back pain    Dizziness    r/t to meds   GERD (gastroesophageal reflux disease)    no meds on a regular basis but will take Tums if needed   Headache(784.0)    r/t neck issues   History of bronchitis 6-32yrs ago   History of colon polyps    benign   HOH (hard of hearing)    wears hearing aids   Hyperlipidemia    takes Atorvastatin  on Mondays and Fridays   Hypertension    Joint pain    Joint swelling    Multiple sclerosis (HCC)    Neuromuscular disorder (HCC)    Dr. FABIENE Crete- [facility], follows [facility]   Nocturia    PONV (postoperative nausea and vomiting)    trouble urinating after surgery in 2014   Postoperative nausea and vomiting 01/11/2019   Rheumatoid arthritis (HCC)    Dr Lorrayne weekly, RA- hands- knees- feet    Rheumatoid arthritis(714.0)    Dr Dolphus takes Xeljanz  daily   Right wrist fracture    Skin cancer    Squamous cell carcinoma of skin 11/02/2001   in situ-right knee (cx10fu)   Squamous cell carcinoma of skin 11/12/2011   in situ-left forearm (CX35FU), in situ-left foot (CX35FU)   Squamous cell carcinoma of skin 01/22/2012   in situ-left lower forearm (txpbx)   Squamous cell carcinoma of skin 11/17/2012   right shin (txpbx)   Squamous cell carcinoma of skin 10/28/2013   in situ-right shoulder (CX35FU)   Squamous cell carcinoma of skin 04/28/2014   well diff-right shin (txpbx)   Squamous cell carcinoma of skin 11/02/2014   in situ-Left shin (txpbx)   Squamous cell carcinoma of skin 12/15/2014   in situ-Left hand (txpbx), bowens-left side chest (txpbx)   Squamous cell carcinoma of skin 05/09/2015   in situ-left hand (txpbx)    Squamous cell carcinoma of skin 05/07/2016   in situ-left inner shin,ant, in situ-left inner shin, post, in situ-left outer  forearm, in situ-right knuckle   Squamous cell carcinoma of skin 03/1302018   in situ-left shoulder (txpbx), in situ-right inner shin (txpbx), in situ-top of left foot (txpbx), KA- left forearm (txpbx)   Squamous cell carcinoma of skin 12/02/2016   in situ-left inner shin (txpbx)   Squamous cell carcinoma of skin 03/04/2017   in situ-left outer sup, shin (txpbx), in situ- right 2nd knuckle finger (txpbx), in situ- Left wrist (txpbx)   Squamous cell carcinoma of skin 04/06/2018   in situ-above left knee inner (txpbx)   Squamous cell carcinoma of skin 04/21/2018   in situ-right top hand (txpbx)   Squamous cell carcinoma of skin 07/08/2018   in situ-right lower inner shin (txpbx)   Squamous cell carcinoma of skin 04/27/2019   in situ- Left neck(CX35FU), In situ- right neck (CX35FU)   Urinary retention    sees Dr.Wrenn about 2 times a yr    Family History  Problem Relation Age of Onset  Cancer Mother        pancreatic   Diabetes Mother    Pancreatic cancer Mother    Heart disease Father        Rheumatic   Cancer Father        ? stomach   Stomach cancer Father    Cancer Sister        stomach   Stomach cancer Sister    Cancer Maternal Aunt        X 4; ? primary   Heart disease Paternal Aunt    Cancer Maternal Grandmother        cervical   Colon cancer Other        Aunts   Multiple sclerosis Daughter    Hypertension Neg Hx    Stroke Neg Hx    Esophageal cancer Neg Hx    Rectal cancer Neg Hx    Past Surgical History:  Procedure Laterality Date   ABDOMINAL HYSTERECTOMY     1985   ANTERIOR CERVICAL DECOMP/DISCECTOMY FUSION N/A 04/23/2022   Procedure: Anterior Cervical Decompression/Discectomy Fusion - Cervical Seven-Thoracic One;  Surgeon: Louis Shove, MD;  Location: [facility];  Service: Neurosurgery;  Laterality: N/A;  3C   APPENDECTOMY     with TAH   BACK SURGERY     several   BREAST BIOPSY Right    Results were negative   BROW LIFT Bilateral 10/02/2016   Procedure:  BILATERAL LOWER LID BLEPHAROPLASTY;  Surgeon: Laurie Loyd Redhead, MD;  Location: [facility];  Service: Plastics;  Laterality: Bilateral;   CARPAL TUNNEL RELEASE Right    cataracts     CERVICAL FUSION     Dr Leeann   CERVICAL FUSION  12/31/2012   Dr Leeann   CERVICAL FUSION  04/2022   CHEST TUBE INSERTION     for traumatic Pneumothorax   COLONOSCOPY  07/16/2010   normal    COLONOSCOPY W/ POLYPECTOMY  1997   negative since; Dr Debrah   ESOPHAGOGASTRODUODENOSCOPY  07/16/2010   normal   eye lid raise     EYE SURGERY Bilateral    cataracts removed - /w IOL   HARDWARE REMOVAL  08/2021   HIP CLOSED REDUCTION Right 11/16/2023   Procedure: CLOSED MANIPULATION, JOINT, HIP;  Surgeon: Ernie Cough, MD;  Location: [facility];  Service: Orthopedics;  Laterality: Right;   HIP CLOSED REDUCTION N/A 11/30/2023   Procedure: CLOSED REDUCTION, HIP;  Surgeon: Fidel Rogue, MD;  Location: [facility];  Service: Orthopedics;  Laterality: N/A;   INCISION AND DRAINAGE OF WOUND Right 11/28/2023   Procedure: IRRIGATION AND DEBRIDEMENT WOUND; REMOVAL OF HARDWARE;  Surgeon: Edna Toribio LABOR, MD;  Location: [facility];  Service: Orthopedics;  Laterality: Right;  I & D RIGHT ELBOW   JOINT REPLACEMENT     LUMBAR FUSION     Dr Leeann   NASAL SINUS SURGERY     ORIF ELBOW FRACTURE Right 11/20/2023   Procedure: OPEN REDUCTION INTERNAL FIXATION (ORIF) ELBOW/OLECRANON FRACTURE;  Surgeon: [name] Franky SQUIBB, MD;  Location: [facility];  Service: Orthopedics;  Laterality: Right;   POSTERIOR CERVICAL FUSION/FORAMINOTOMY N/A 12/31/2012   Procedure: POSTERIOR LATERAL CERVICAL FUSION/FORAMINOTOMY LEVEL 1 CERVICAL THREE-FOUR WITH LATERAL MASS SCREWS;  Surgeon: Catalina CHRISTELLA Leeann, MD;  Location: [facility];  Service: Neurosurgery;  Laterality: N/A;   PTOSIS REPAIR Bilateral 10/02/2016   Procedure: INTERNAL PTOSIS REPAIR;  Surgeon: Laurie Loyd Redhead, MD;  Location: [facility];  Service: Plastics;  Laterality: Bilateral;   SKIN BIOPSY  THYROID  SURGERY      R lobe removed- 1972, has grown back - CT last done- 2018   TOTAL HIP ARTHROPLASTY  12/22/2011   Procedure: TOTAL HIP ARTHROPLASTY;  Surgeon: Dempsey JINNY Sensor, MD;  Location: [facility];  Service: Orthopedics;  Laterality: Left;   TOTAL HIP ARTHROPLASTY Right 11/12/2023   Procedure: ARTHROPLASTY, HIP, TOTAL, ANTERIOR APPROACH;  Surgeon: Fidel Rogue, MD;  Location: [facility];  Service: Orthopedics;  Laterality: Right;   TOTAL HIP REVISION Right 12/03/2023   Procedure: TOTAL HIP REVISION;  Surgeon: Fidel Rogue, MD;  Location: [facility];  Service: Orthopedics;  Laterality: Right;  Right acetabular revision Anterior approach   TOTAL SHOULDER ARTHROPLASTY     TUBAL LIGATION     UPPER GASTROINTESTINAL ENDOSCOPY  2012   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   WRIST SURGERY Left 09/16/2022   wrist/arm   Social History   Social History Narrative   Not on file   Immunization History  Administered Date(s) Administered   Fluad Quad(high Dose 65+) 02/02/2020, 02/01/2022   Influenza Whole 02/22/2007, 02/08/2013   Influenza, High Dose Seasonal PF 02/04/2015, 02/18/2017, 02/21/2018, 01/16/2019, 01/25/2021, 02/02/2023   Influenza-Unspecified 01/24/2014, 01/25/2016, 02/18/2017, 01/16/2019, 01/25/2020   Moderna Covid-19 Fall Seasonal Vaccine 54yrs & older 02/02/2023, 09/07/2023   Moderna Covid-19 Vaccine Bivalent Booster 42yrs & up 02/11/2022   PFIZER(Purple Top)SARS-COV-2 Vaccination 06/18/2019, 07/09/2019, 01/09/2020, 07/03/2020, 02/07/2021   PNEUMOCOCCAL CONJUGATE-20 02/26/2022   Pfizer Covid-19 Vaccine Bivalent Booster 38yrs & up 02/08/2021   Pneumococcal Conjugate-13 11/23/2012, 09/08/2014   Pneumococcal Polysaccharide-23 05/26/2006   Pneumococcal-Unspecified 12/28/2012, 06/09/2020   Respiratory Syncytial Virus Vaccine,Recomb Aduvanted(Arexvy) 01/23/2022   Td 05/28/2010   Tdap 12/24/2020, 01/07/2021   Unspecified SARS-COV-2 Vaccination 06/26/2020, 02/11/2022   Varicella 11/07/2020   Zoster  Recombinant(Shingrix) 02/10/2019, 04/14/2019   Zoster, Live 05/26/2009, 07/10/2020     Objective: Vital Signs: There were no vitals taken for this visit.   Physical Exam   Musculoskeletal Exam: ***  CDAI Exam: CDAI Score: -- Patient Global: --; Provider Global: -- Swollen: --; Tender: -- Joint Exam 12/23/2023   No joint exam has been documented for this visit   There is currently no information documented on the homunculus. Go to the Rheumatology activity and complete the homunculus joint exam.  Investigation: No additional findings.  Imaging: DG Pelvis Portable Result Date: 12/03/2023 CLINICAL DATA:  Status post revision of right total hip replacement. EXAM: PORTABLE PELVIS 1-2 VIEWS COMPARISON:  November 30, 2023. FINDINGS: Status post bilateral total hip arthroplasties. No fracture or dislocation is noted. Expected postoperative changes seen around the right hip. IMPRESSION: Postsurgical changes as noted above. Electronically Signed   By: Lynwood Landy Raddle M.D.   On: 12/03/2023 19:37   DG HIP UNILAT WITH PELVIS 1V RIGHT Result Date: 12/03/2023 CLINICAL DATA:  Right total hip arthroplasty. EXAM: DG HIP (WITH OR WITHOUT PELVIS) 1V RIGHT; DG C-ARM 1-60 MIN-NO REPORT Radiation exposure index: 0.6844 mGy. COMPARISON:  November 30, 2023. FINDINGS: Two intraoperative fluoroscopic images were obtained of the right hip. Right acetabular and femoral components are well situated. IMPRESSION: Status post right total hip arthroplasty.  No dislocation is noted. Electronically Signed   By: Lynwood Landy Raddle M.D.   On: 12/03/2023 18:12   DG C-Arm 1-60 Min-No Report Result Date: 12/03/2023 Fluoroscopy was utilized by the requesting physician.  No radiographic interpretation.   DG C-Arm 1-60 Min-No Report Result Date: 12/03/2023 Fluoroscopy was utilized by the requesting physician.  No radiographic interpretation.   DG HIP  UNILAT WITH PELVIS 1V RIGHT Result Date: 11/30/2023 CLINICAL DATA:  Elective surgery.   Closed reduction of right hip. EXAM: DG HIP (WITH OR WITHOUT PELVIS) 1V RIGHT COMPARISON:  Pelvis radiograph earlier today FINDINGS: Persistent superior dislocation of the femoral component of right hip arthroplasty. The femoral component may be slightly more laterally displaced than on prior exam. Overlying skin staples remain in place. Barium previously in the small bowel has progressed to the colon. 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 The femoral component may be slightly more laterally displaced than on prior exam. Electronically Signed   By: Andrea Gasman M.D.   On: 11/30/2023 19:54   DG Pelvis Portable Result Date: 11/30/2023 CLINICAL DATA:  [id number] Hip instability, right [id number] EXAM: PORTABLE PELVIS 1-2 VIEWS COMPARISON:  11/16/2023 FINDINGS: Please note the distal femoral stem is not included in the field of view. The femoral component of right hip arthroplasty remains superiorly dislocated with respect to the acetabular cup. There may be more superior migration than on prior exam. No definite fracture is seen. Overlying skin staples remain in place. Portions of left hip arthroplasty are visualized. 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 Electronically Signed   By: Andrea Gasman M.D.   On: 11/30/2023 11:52   DG Swallowing Func-Speech Pathology Result Date: 11/30/2023 Table formatting from the original result was not included. Modified Barium Swallow Study Patient Details Name: LILYANNAH ZUELKE MRN: [id number] Date of Birth: 1940/07/20 Today's Date: 11/30/2023 HPI/PMH: HPI: Pt is an 83 y.o. female who presented to the ED 7/4 due to concerns for exposed surgical hardware from previous elbow surgery in June. SLP consulted due to RN's observance of coughing with liquids. PMH: hypertension, rheumatoid arthritis, chronic pain, urinary retention. Clinical Impression: Clinical Impression: Pt  demonstrates a moderate to severe oropharyngeal dysphagia with a DIGEST score of 4 due to chronic and gross aspiration of thin liquids and poor efficiency of swallowing with greater than 25% of bolus residue remaining in valleculae with solids. Primary problem is decreased epiglottic deflection and decreased PES opening, both appearing due to structural changes of pharynx after ACDF. Pt has good laryngeal elevation and closure with initial swallow, but residue in valleculae and above PES is penetrated post swallow and ultimately aspirated with insufficient sensation. Attempted head turns, chin tuck, supraglottic swallow with thin, none of which prevented aspiration. A chin tuck increased epiglottic deflection, but worsened PES opening and severity of aspiration. Head turn did not improve efficiency with solids. AP view showed symmetrical residue. Pt did show potential to improve epiglottic deflection, base of tongue retraction and ultimately bolus propulsion with effort and strengthing exercises to this area may be of benefit. Pt was able to better manage mildly thick/nectar liquids with improved airway proection and time for multiple swallows. Will need to discuss pts wishes. This dysphagia is chronic, but pt is deconditioned at this time (recent hip fx and arm surgery) and has lost weight possibly due to dysphagia. Will advise nectar thick liquids, IDDSI 5 solids, multiple swallow and coughing, RMST, water  protocol and BOT and pharyngeal stregthening in next session. Factors that may increase risk of adverse event in presence of aspiration Noe & Lianne 2021): No data recorded Recommendations/Plan: Swallowing Evaluation Recommendations Swallowing Evaluation Recommendations Recommendations: PO diet; Free water  protocol after oral care PO Diet Recommendation: Dysphagia 2 (Finely chopped); Mildly thick liquids (Level 2, nectar thick) Liquid Administration via: Cup; Straw Medication Administration: Whole meds with  liquid Supervision: Patient able to self-feed Swallowing strategies  : Slow rate; Small bites/sips; effortful swallow; Multiple dry swallows after each bite/sip; Follow solids with liquids; Clear throat intermittently Postural changes: Position pt fully upright for meals Oral care recommendations: Oral care before ice chips/water ; Oral care before PO Treatment Plan Treatment Plan Treatment recommendations: Therapy as outlined in treatment plan below Follow-up recommendations: Acute inpatient rehab (3 hours/day) Functional status assessment: Patient has had a recent decline in their functional status and demonstrates the ability to make significant improvements in function in a reasonable and predictable amount of time. Treatment frequency: Min 2x/week Treatment duration: 2 weeks Interventions: Aspiration precaution training; Oropharyngeal exercises; Compensatory techniques; Patient/family education; Diet toleration management by SLP; Respiratory muscle strength training Recommendations Recommendations for follow up therapy are one component of a multi-disciplinary discharge planning process, led by the attending physician.  Recommendations may be updated based on patient status, additional functional criteria and insurance authorization. Assessment: Orofacial Exam: Orofacial Exam Oral Cavity: Oral Hygiene: WFL Oral Cavity - Dentition: Adequate natural dentition Anatomy: Anatomy: Presence of cervical hardware Boluses Administered: Boluses Administered Boluses Administered: Thin liquids (Level 0); Mildly thick liquids (Level 2, nectar thick); Puree; Solid  Oral Impairment Domain: Oral Impairment Domain Lip Closure: No labial escape Tongue control during bolus hold: Cohesive bolus between tongue to palatal seal Bolus preparation/mastication: Timely and efficient chewing and mashing Bolus transport/lingual motion: Brisk tongue motion Oral residue: Trace residue lining oral structures Location of oral residue : Tongue  Initiation of pharyngeal swallow : Posterior angle of the ramus  Pharyngeal Impairment Domain: Pharyngeal Impairment Domain Soft palate elevation: No bolus between soft palate (SP)/pharyngeal wall (PW) Laryngeal elevation: Complete superior movement of thyroid  cartilage with complete approximation of arytenoids to epiglottic petiole Anterior hyoid excursion: Complete anterior movement Epiglottic movement: Partial inversion Laryngeal vestibule closure: Complete, no air/contrast in laryngeal vestibule Pharyngeal stripping wave : Present - diminished Pharyngeal contraction (A/P view only): Complete Pharyngoesophageal segment opening: Partial distention/partial duration, partial obstruction of flow Tongue base retraction: Wide column of contrast or air between tongue base and PPW Pharyngeal residue: Majority of contrast within or on pharyngeal structures Location of pharyngeal residue: Valleculae; Pyriform sinuses; Tongue base  Esophageal Impairment Domain: No data recorded Pill: No data recorded Penetration/Aspiration Scale Score: Penetration/Aspiration Scale Score 1.  Material does not enter airway: Puree; Solid 2.  Material enters airway, remains ABOVE vocal cords then ejected out: Thin liquids (Level 0); Mildly thick liquids (Level 2, nectar thick) 4.  Material enters airway, CONTACTS cords then ejected out: Mildly thick liquids (Level 2, nectar thick) 5.  Material enters airway, CONTACTS cords and not ejected out: Thin liquids (Level 0) 7.  Material enters airway, passes BELOW cords and not ejected out despite cough attempt by patient: Thin liquids (Level 0) 8.  Material enters airway, passes BELOW cords without attempt by patient to eject out (silent aspiration) : Thin liquids (Level 0) Compensatory Strategies: Compensatory Strategies Compensatory strategies: Yes Straw: Ineffective Effortful swallow: Effective Effective Effortful Swallow: Puree; Mildly thick liquid (Level 2, nectar thick) Multiple swallows:  Effective Effective Multiple Swallows: Mildly thick liquid (Level 2, nectar thick); Puree Chin tuck: Ineffective Ineffective Chin Tuck: Thin liquid (Level 0) Liquid wash: Effective Effective Liquid Wash: Mildly thick liquid (Level 2, nectar thick); Puree Left head turn: Ineffective Ineffective Left Head Turn: Mildly thick liquid (Level 2, nectar thick); Puree Right head turn: Ineffective Ineffective Right Head Turn: Mildly thick liquid (Level 2, nectar thick); Puree Supraglottic swallow: Ineffective Ineffective Supraglottic Swallow: Thin  liquid (Level 0) Super supraglottic swallow: Ineffective Ineffective Super Supraglottic Swallow: Thin liquid (Level 0) Mendelsohn : Ineffective Ineffective Mendelsohn: Thin liquid (Level 0)   General Information: Caregiver present: No  Diet Prior to this Study: Regular; Thin liquids (Level 0)   Temperature : Normal   Respiratory Status: WFL   Supplemental O2: None (Room air)   History of Recent Intubation: No  Behavior/Cognition: Alert; Cooperative; Pleasant mood Self-Feeding Abilities: Able to self-feed Baseline vocal quality/speech: Dysphonic Volitional Cough: Able to elicit Volitional Swallow: Able to elicit No data recorded Goal Planning: Prognosis for improved oropharyngeal function: Good No data recorded No data recorded Patient/Family Stated Goal: none stated No data recorded Pain: Pain Assessment Pain Assessment: No/denies pain End of Session: Start Time:SLP Start Time (ACUTE ONLY): 0834 Stop Time: SLP Stop Time (ACUTE ONLY): 0856 Time Calculation:SLP Time Calculation (min) (ACUTE ONLY): 22 min Charges: SLP Evaluations $ SLP Speech Visit: 1 Visit SLP Evaluations $BSS Swallow: 1 Procedure $MBS Swallow: 1 Procedure $Swallowing Treatment: 1 Procedure SLP visit diagnosis: SLP Visit Diagnosis: Dysphagia, unspecified (R13.10) Past Medical History: Past Medical History: Diagnosis Date  Anxiety   takes Valium  daily as needed  Basal cell carcinoma 01/21/1988  left nostril (MOHS),  sup-right calf (CX35FU)  Basal cell carcinoma 03/22/1996  right calf (CX35FU)  Basal cell carcinoma 03/31/1995  left wing nose  Bruises easily   d/t meds  Cancer (HCC)   basal cell ca, in situ- uterine   Cataracts, bilateral   removed bilateral  Chronic back pain   Dizziness   r/t to meds  GERD (gastroesophageal reflux disease)   no meds on a regular basis but will take Tums if needed  Headache(784.0)   r/t neck issues  History of bronchitis 6-68yrs ago  History of colon polyps   benign  HOH (hard of hearing)   wears hearing aids  Hyperlipidemia   takes Atorvastatin  on Mondays and Fridays  Hypertension   Joint pain   Joint swelling   Multiple sclerosis (HCC)   Neuromuscular disorder (HCC)   Dr. FABIENE Crete- [facility], follows [facility]  Nocturia   PONV (postoperative nausea and vomiting)   trouble urinating after surgery in 2014  Postoperative nausea and vomiting 01/11/2019  Rheumatoid arthritis (HCC)   Dr Lorrayne weekly, RA- hands- knees- feet   Rheumatoid arthritis(714.0)   Dr Dolphus takes Xeljanz  daily  Right wrist fracture   Skin cancer   Squamous cell carcinoma of skin 11/02/2001  in situ-right knee ([id number])  Squamous cell carcinoma of skin 11/12/2011  in situ-left forearm (CX35FU), in situ-left foot (CX35FU)  Squamous cell carcinoma of skin 01/22/2012  in situ-left lower forearm (txpbx)  Squamous cell carcinoma of skin 11/17/2012  right shin (txpbx)  Squamous cell carcinoma of skin 10/28/2013  in situ-right shoulder ([id number])  Squamous cell carcinoma of skin 04/28/2014  well diff-right shin (txpbx)  Squamous cell carcinoma of skin 11/02/2014  in situ-Left shin (txpbx)  Squamous cell carcinoma of skin 12/15/2014  in situ-Left hand (txpbx), bowens-left side chest (txpbx)  Squamous cell carcinoma of skin 05/09/2015  in situ-left hand (txpbx)   Squamous cell carcinoma of skin 05/07/2016  in situ-left inner shin,ant, in situ-left inner shin, post, in situ-left outer forearm, in situ-right knuckle   Squamous cell carcinoma of skin 03/1302018  in situ-left shoulder (txpbx), in situ-right inner shin (txpbx), in situ-top of left foot (txpbx), KA- left forearm (txpbx)  Squamous cell carcinoma of skin 12/02/2016  in situ-left inner shin (txpbx)  Squamous cell carcinoma of  skin 03/04/2017  in situ-left outer sup, shin (txpbx), in situ- right 2nd knuckle finger (txpbx), in situ- Left wrist (txpbx)  Squamous cell carcinoma of skin 04/06/2018  in situ-above left knee inner (txpbx)  Squamous cell carcinoma of skin 04/21/2018  in situ-right top hand (txpbx)  Squamous cell carcinoma of skin 07/08/2018  in situ-right lower inner shin (txpbx)  Squamous cell carcinoma of skin 04/27/2019  in situ- Left neck(CX35FU), In situ- right neck (CX35FU)  Urinary retention   sees Dr.Wrenn about 2 times a yr Past Surgical History: Past Surgical History: Procedure Laterality Date  ABDOMINAL HYSTERECTOMY    1985  ANTERIOR CERVICAL DECOMP/DISCECTOMY FUSION N/A 04/23/2022  Procedure: Anterior Cervical Decompression/Discectomy Fusion - Cervical Seven-Thoracic One;  Surgeon: Louis Shove, MD;  Location: [facility];  Service: Neurosurgery;  Laterality: N/A;  3C  APPENDECTOMY    with TAH  BACK SURGERY    several  BREAST BIOPSY Right   Results were negative  BROW LIFT Bilateral 10/02/2016  Procedure: BILATERAL LOWER LID BLEPHAROPLASTY;  Surgeon: Laurie Loyd Redhead, MD;  Location: [facility];  Service: Plastics;  Laterality: Bilateral;  CARPAL TUNNEL RELEASE Right   cataracts    CERVICAL FUSION    Dr Leeann  CERVICAL FUSION  12/31/2012  Dr Leeann  CERVICAL FUSION  04/2022  CHEST TUBE INSERTION    for traumatic Pneumothorax  COLONOSCOPY  07/16/2010  normal   COLONOSCOPY W/ POLYPECTOMY  1997  negative since; Dr Debrah  ESOPHAGOGASTRODUODENOSCOPY  07/16/2010  normal  eye lid raise    EYE SURGERY Bilateral   cataracts removed - /w IOL  HARDWARE REMOVAL  08/2021  HIP CLOSED REDUCTION Right 11/16/2023  Procedure: CLOSED MANIPULATION, JOINT, HIP;  Surgeon: Ernie Cough, MD;  Location: [facility];  Service: Orthopedics;  Laterality: Right;  INCISION AND DRAINAGE OF WOUND Right 11/28/2023  Procedure: IRRIGATION AND DEBRIDEMENT WOUND; REMOVAL OF HARDWARE;  Surgeon: Edna Toribio LABOR, MD;  Location: [facility];  Service: Orthopedics;  Laterality: Right;  I & D RIGHT ELBOW  JOINT REPLACEMENT    LUMBAR FUSION    Dr Leeann  NASAL SINUS SURGERY    ORIF ELBOW FRACTURE Right 11/20/2023  Procedure: OPEN REDUCTION INTERNAL FIXATION (ORIF) ELBOW/OLECRANON FRACTURE;  Surgeon: [name] Franky SQUIBB, MD;  Location: [facility];  Service: Orthopedics;  Laterality: Right;  POSTERIOR CERVICAL FUSION/FORAMINOTOMY N/A 12/31/2012  Procedure: POSTERIOR LATERAL CERVICAL FUSION/FORAMINOTOMY LEVEL 1 CERVICAL THREE-FOUR WITH LATERAL MASS SCREWS;  Surgeon: Catalina CHRISTELLA Leeann, MD;  Location: [facility];  Service: Neurosurgery;  Laterality: N/A;  PTOSIS REPAIR Bilateral 10/02/2016  Procedure: INTERNAL PTOSIS REPAIR;  Surgeon: Laurie Loyd Redhead, MD;  Location: [facility];  Service: Plastics;  Laterality: Bilateral;  SKIN BIOPSY    THYROID  SURGERY    R lobe removed- 1972, has grown back - CT last done- 2018  TOTAL HIP ARTHROPLASTY  12/22/2011  Procedure: TOTAL HIP ARTHROPLASTY;  Surgeon: Dempsey JINNY Sensor, MD;  Location: [facility];  Service: Orthopedics;  Laterality: Left;  TOTAL HIP ARTHROPLASTY Right 11/12/2023  Procedure: ARTHROPLASTY, HIP, TOTAL, ANTERIOR APPROACH;  Surgeon: Fidel Rogue, MD;  Location: [facility];  Service: Orthopedics;  Laterality: Right;  TOTAL SHOULDER ARTHROPLASTY    TUBAL LIGATION    UPPER GASTROINTESTINAL ENDOSCOPY  2012  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 No additional acute bony abnormality. IMPRESSION: Postoperative changes as above. Electronically Signed   By: Franky Crease M.D.   On: 11/28/2023 16:22   DG ELBOW COMPLETE RIGHT (3+VIEW) Result Date: 11/27/2023 CLINICAL DATA:  [id number] Fracture of right olecranon process [id number] EXAM: RIGHT ELBOW - COMPLETE 3+ VIEW COMPARISON:  X-ray right elbow 11/20/2023, x-ray right elbow 11/12/2023 FINDINGS: Screw fixation of olecranon fracture with interval migration of the screw and fracture fragment out of the screw and alignments. Trace periosteal reaction along the olecranon. Question destruction of the fracture fragments. No acute fracture identified. No dislocation. No definite joint effusion. Subcutaneus soft tissue edema. IMPRESSION: 1. Nonhealing fracture of the olecranon with migrated/malpositioned screw. 2. Question destruction of the fracture fragments. Underlying infection/osteomyelitis not excluded. Electronically Signed   By: Morgane  Naveau M.D.   On: 11/27/2023 01:09   DG Elbow 2 Views Right Result Date: 11/20/2023 CLINICAL DATA:  Fracture, postop. EXAM: RIGHT ELBOW - 2 VIEW COMPARISON:  Preoperative radiograph FINDINGS: Screw traverses the olecranon fracture. There is improved fracture alignment from preoperative imaging. Persistent displacement of 6-7 mm at the articular surface. Overlying soft tissue edema and air related to recent surgery. IMPRESSION: ORIF of olecranon fracture. Electronically Signed   By: Andrea Gasman M.D.   On: 11/20/2023 11:14   DG ELBOW COMPLETE RIGHT (3+VIEW) Result Date: 11/20/2023 CLINICAL DATA:  Elective surgery EXAM: RIGHT ELBOW - COMPLETE 3+ VIEW COMPARISON:  Radiograph 11/12/2023 FINDINGS: Four fluoroscopic spot views of the elbow submitted from the operating room. Screw traverses the proximal ulnar fracture with improved fracture alignment. Fluoroscopy time 23 seconds. Dose 0.29 mGy. IMPRESSION: Intraoperative fluoroscopy  during proximal ulnar fracture fixation. Electronically Signed   By: Andrea Gasman M.D.   On: 11/20/2023 11:13   DG C-Arm 1-60 Min-No Report Result Date: 11/20/2023 Fluoroscopy was utilized by the requesting physician.  No radiographic interpretation.   DG Pelvis Portable Result Date: 11/16/2023 EXAM: 2 VIEW(S) XRAY OF THE PELVIS 11/16/2023 07:31:00 AM COMPARISON: None available. 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 FINDINGS: BONES AND JOINTS: The image at 07:24 am shows a persistent superior dislocation of the right total hip arthroplasty. Image at 7:27 demonstrates the hip is located. Skin staples remain. Visualized left total hip arthroplasty is within normal limits. Lumbar fusion is noted. SOFT TISSUES: The soft tissues are unremarkable. IMPRESSION: 1. 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 2. Visualized left total hip arthroplasty is within normal limits. Electronically signed by: Lonni Necessary MD 11/16/2023 07:49 AM EDT RP Workstation: [id number]   DG Pelvis Portable Result Date: 11/15/2023 CLINICAL DATA:  Right hip pain status post right hip surgery 3 days ago. EXAM: PORTABLE PELVIS 1-2 VIEWS COMPARISON:  11/12/2023 FINDINGS: Bilateral hip replacements. There is superior dislocation of the right hip replacement. The acetabular cup appears more vertically oriented than on prior study. No fracture. IMPRESSION: Bilateral replacements. Superior dislocation of the right hip replacement. The acetabular component appears more vertically oriented. Electronically Signed   By: Franky Crease M.D.   On: 11/15/2023 18:35   DG Pelvis Portable Result Date: 11/12/2023 CLINICAL DATA:  Status post total right hip arthroplasty. EXAM: PORTABLE PELVIS 1-2 VIEWS COMPARISON:  AP pelvis 11/10/2023, right hip radiographs 11/10/2023 FINDINGS: Interval total right hip arthroplasty. Redemonstration of total left  hip arthroplasty. The distal femoral stems  are not imaged. No perihardware lucency is seen to indicate hardware failure or loosening. Lateral right hip and thigh subcutaneous air expected postoperative change. Lateral right thigh surgical skin staples. Mild superior pubic symphysis joint space narrowing. Lumbar spine and superior sacral posterior fusion hardware again noted. IMPRESSION: Interval total right hip arthroplasty without evidence of hardware failure. Electronically Signed   By: Tanda Lyons M.D.   On: 11/12/2023 19:09   DG Elbow 2 Views Right Result Date: 11/12/2023 CLINICAL DATA:  Right elbow fracture. EXAM: RIGHT ELBOW - 2 VIEW COMPARISON:  Right elbow radiographs 10/08/2023 FINDINGS: There is diffuse decreased bone mineralization. There is increased diastasis of the previously seen transverse fracture of the olecranon. Posteriorly, this diastasis measures up to 25 cm, previously 1.6 cm and anteriorly 1.0 cm, previously 0.9 cm. No healing bridging sclerosis or peripheral callus formation. Moderate radiocapitellar joint space narrowing. Elevation of the distal humeral fat pad suggesting an elbow joint effusion. IMPRESSION: Increased diastasis of the previously seen transverse fracture of the olecranon. No healing bridging sclerosis at this time. Electronically Signed   By: Tanda Lyons M.D.   On: 11/12/2023 19:08   DG HIP UNILAT WITH PELVIS 1V RIGHT Result Date: 11/12/2023 CLINICAL DATA:  Total hip arthroplasty anterior approach. EXAM: DG HIP (WITH OR WITHOUT PELVIS) 1V RIGHT COMPARISON:  Right hip radiographs 11/10/2023 FINDINGS: Images were performed intraoperatively without the presence of a radiologist. The patient is undergoing total right hip arthroplasty. Partial visualization of prior total left hip arthroplasty. No hardware complication is seen. Total fluoroscopy images: 4 Total fluoroscopy time: 13 seconds Total dose: Radiation Exposure Index (as provided by the fluoroscopic device):  1.39 mGy air Kerma Please see intraoperative findings for further detail. IMPRESSION: Intraoperative fluoroscopy for total right hip arthroplasty. Electronically Signed   By: Tanda Lyons M.D.   On: 11/12/2023 19:06   DG C-Arm 1-60 Min-No Report Result Date: 11/12/2023 Fluoroscopy was utilized by the requesting physician.  No radiographic interpretation.   DG C-Arm 1-60 Min-No Report Result Date: 11/12/2023 Fluoroscopy was utilized by the requesting physician.  No radiographic interpretation.   DG C-Arm 1-60 Min-No Report Result Date: 11/12/2023 Fluoroscopy was utilized by the requesting physician.  No radiographic interpretation.   DG Pelvis 1-2 Views Result Date: 11/10/2023 CLINICAL DATA:  Right hip pain since fall 4 weeks ago. EXAM: PELVIS - 1-2 VIEW COMPARISON:  Hip series today FINDINGS: Postoperative changes from posterior fusion in the lower lumbar spine. Prior left hip replacement. No hardware complicating feature. Foreshortening of the right femoral neck compatible with impacted femoral neck fracture. No subluxation or dislocation. IMPRESSION: Impacted right femoral neck fracture. Electronically Signed   By: Franky Crease M.D.   On: 11/10/2023 16:39   DG HIP UNILAT WITH PELVIS 2-3 VIEWS RIGHT Result Date: 11/10/2023 CLINICAL DATA:  Fall 4 weeks ago, right hip pain EXAM: DG HIP (WITH OR WITHOUT PELVIS) 2-3V RIGHT COMPARISON:  07/12/2021 FINDINGS: There is foreshortening of the femoral neck most compatible with impacted femoral neck fracture. No subluxation or dislocation. IMPRESSION: Impacted right femoral neck fracture. Electronically Signed   By: Franky Crease M.D.   On: 11/10/2023 16:39    Recent Labs: Lab Results  Component Value Date   WBC 7.4 12/08/2023   HGB 10.5 (L) 12/08/2023   PLT 330 12/08/2023   NA 139 12/09/2023   K 3.5 12/09/2023   CL 103 12/09/2023   CO2 25 12/09/2023   GLUCOSE 80 12/09/2023   BUN 5 (L) 12/09/2023   CREATININE  0.58 12/09/2023   BILITOT 1.0  12/09/2023   ALKPHOS 67 12/09/2023   AST 18 12/09/2023   ALT 10 12/09/2023   PROT 4.6 (L) 12/09/2023   ALBUMIN  2.1 (L) 12/09/2023   CALCIUM  8.2 (L) 12/09/2023   GFRAA 65 09/26/2020   QFTBGOLDPLUS NEGATIVE 11/03/2022    Speciality Comments: Prior therapy includes: Xeljanz  (cost), Olumiant -inadequate response, Imuran weakness, leflunomide-intolerance Prolia : 05/04/18, 11/12/18, 06/09/19, 12/07/19, 07/05/20, 01/01/21, 07/09/21, 01/09/22, 07/24/22  Procedures:  No procedures performed Allergies: Darvon [propoxyphene hcl], Demerol [meperidine], Penicillins, Imuran [azathioprine], Sulfa  antibiotics, Arava [leflunomide], Lipitor [atorvastatin ], and Vytorin [ezetimibe-simvastatin]   Assessment / Plan:     Visit Diagnoses: No diagnosis found.  Orders: No orders of the defined types were placed in this encounter.  No orders of the defined types were placed in this encounter.   Face-to-face time spent with patient was *** minutes. Greater than 50% of time was spent in counseling and coordination of care.  Follow-Up Instructions: No follow-ups on file.   Maya Nash, MD  Note - This record has been created using Animal nutritionist.  Chart creation errors have been sought, but may not always  have been located. Such creation errors do not reflect on  the standard of medical care.

## 2023-12-09 NOTE — Progress Notes (Signed)
 Inpatient Rehabilitation Care Coordinator Assessment and Plan Patient Details  Name: Sherri [name] MRN: [id number] Date of Birth: [birth year]  Today's Date: 12/09/2023  Hospital Problems: Principal Problem:   History of revision of total replacement of right hip joint Active Problems:   Postoperative wound infection   Closed fracture of right elbow  Past Medical History:  Past Medical History:  Diagnosis Date   Anxiety    takes Valium  daily as needed   Basal cell carcinoma 01/21/1988   left nostril (MOHS), sup-right calf (CX35FU)   Basal cell carcinoma 03/22/1996   right calf (CX35FU)   Basal cell carcinoma 03/31/1995   left wing nose   Bruises easily    d/t meds   Cancer (HCC)    basal cell ca, in situ- uterine    Cataracts, bilateral    removed bilateral   Chronic back pain    Dizziness    r/t to meds   GERD (gastroesophageal reflux disease)    no meds on a regular basis but will take Tums if needed   Headache(784.0)    r/t neck issues   History of bronchitis 6-21yrs ago   History of colon polyps    benign   HOH (hard of hearing)    wears hearing aids   Hyperlipidemia    takes Atorvastatin  on Mondays and Fridays   Hypertension    Joint pain    Joint swelling    Multiple sclerosis (HCC)    Neuromuscular disorder (HCC)    Dr. FABIENE Crete- [facility], follows [facility]   Nocturia    PONV (postoperative nausea and vomiting)    trouble urinating after surgery in 2014   Postoperative nausea and vomiting 01/11/2019   Rheumatoid arthritis (HCC)    Dr Lorrayne weekly, RA- hands- knees- feet    Rheumatoid arthritis(714.0)    Dr Dolphus takes Xeljanz  daily   Right wrist fracture    Skin cancer    Squamous cell carcinoma of skin 11/02/2001   in situ-right knee (cx28fu)   Squamous cell carcinoma of skin 11/12/2011   in situ-left forearm (CX35FU), in situ-left foot (CX35FU)   Squamous cell carcinoma of skin 01/22/2012   in situ-left lower forearm  (txpbx)   Squamous cell carcinoma of skin 11/17/2012   right shin (txpbx)   Squamous cell carcinoma of skin 10/28/2013   in situ-right shoulder (CX35FU)   Squamous cell carcinoma of skin 04/28/2014   well diff-right shin (txpbx)   Squamous cell carcinoma of skin 11/02/2014   in situ-Left shin (txpbx)   Squamous cell carcinoma of skin 12/15/2014   in situ-Left hand (txpbx), bowens-left side chest (txpbx)   Squamous cell carcinoma of skin 05/09/2015   in situ-left hand (txpbx)    Squamous cell carcinoma of skin 05/07/2016   in situ-left inner shin,ant, in situ-left inner shin, post, in situ-left outer forearm, in situ-right knuckle   Squamous cell carcinoma of skin 03/1302018   in situ-left shoulder (txpbx), in situ-right inner shin (txpbx), in situ-top of left foot (txpbx), KA- left forearm (txpbx)   Squamous cell carcinoma of skin 12/02/2016   in situ-left inner shin (txpbx)   Squamous cell carcinoma of skin 03/04/2017   in situ-left outer sup, shin (txpbx), in situ- right 2nd knuckle finger (txpbx), in situ- Left wrist (txpbx)   Squamous cell carcinoma of skin 04/06/2018   in situ-above left knee inner (txpbx)   Squamous cell carcinoma of skin 04/21/2018   in situ-right top hand (txpbx)   Squamous  cell carcinoma of skin 07/08/2018   in situ-right lower inner shin (txpbx)   Squamous cell carcinoma of skin 04/27/2019   in situ- Left neck(CX35FU), In situ- right neck (CX35FU)   Urinary retention    sees Dr.Wrenn about 2 times a yr   Past Surgical History:  Past Surgical History:  Procedure Laterality Date   ABDOMINAL HYSTERECTOMY     1985   ANTERIOR CERVICAL DECOMP/DISCECTOMY FUSION N/A 04/23/2022   Procedure: Anterior Cervical Decompression/Discectomy Fusion - Cervical Seven-Thoracic One;  Surgeon: Louis Shove, MD;  Location: [facility];  Service: Neurosurgery;  Laterality: N/A;  3C   APPENDECTOMY     with TAH   BACK SURGERY     several   BREAST BIOPSY Right    Results were  negative   BROW LIFT Bilateral 10/02/2016   Procedure: BILATERAL LOWER LID BLEPHAROPLASTY;  Surgeon: Laurie Loyd Redhead, MD;  Location: [facility] OR;  Service: Plastics;  Laterality: Bilateral;   CARPAL TUNNEL RELEASE Right    cataracts     CERVICAL FUSION     Dr Leeann   CERVICAL FUSION  12/31/2012   Dr Leeann   CERVICAL FUSION  04/2022   CHEST TUBE INSERTION     for traumatic Pneumothorax   COLONOSCOPY  07/16/2010   normal    COLONOSCOPY W/ POLYPECTOMY  1997   negative since; Dr Debrah   ESOPHAGOGASTRODUODENOSCOPY  07/16/2010   normal   eye lid raise     EYE SURGERY Bilateral    cataracts removed - /w IOL   HARDWARE REMOVAL  08/2021   HIP CLOSED REDUCTION Right 11/16/2023   Procedure: CLOSED MANIPULATION, JOINT, HIP;  Surgeon: Ernie Cough, MD;  Location: [facility];  Service: Orthopedics;  Laterality: Right;   HIP CLOSED REDUCTION N/A 11/30/2023   Procedure: CLOSED REDUCTION, HIP;  Surgeon: Fidel Rogue, MD;  Location: [facility];  Service: Orthopedics;  Laterality: N/A;   INCISION AND DRAINAGE OF WOUND Right 11/28/2023   Procedure: IRRIGATION AND DEBRIDEMENT WOUND; REMOVAL OF HARDWARE;  Surgeon: Edna Toribio LABOR, MD;  Location: [facility];  Service: Orthopedics;  Laterality: Right;  I & D RIGHT ELBOW   JOINT REPLACEMENT     LUMBAR FUSION     Dr Leeann   NASAL SINUS SURGERY     ORIF ELBOW FRACTURE Right 11/20/2023   Procedure: OPEN REDUCTION INTERNAL FIXATION (ORIF) ELBOW/OLECRANON FRACTURE;  Surgeon: [name] Franky SQUIBB, MD;  Location: [facility];  Service: Orthopedics;  Laterality: Right;   POSTERIOR CERVICAL FUSION/FORAMINOTOMY N/A 12/31/2012   Procedure: POSTERIOR LATERAL CERVICAL FUSION/FORAMINOTOMY LEVEL 1 CERVICAL THREE-FOUR WITH LATERAL MASS SCREWS;  Surgeon: Catalina CHRISTELLA Leeann, MD;  Location: [facility];  Service: Neurosurgery;  Laterality: N/A;   PTOSIS REPAIR Bilateral 10/02/2016   Procedure: INTERNAL PTOSIS REPAIR;  Surgeon: Laurie Loyd Redhead, MD;  Location: [facility];  Service: Plastics;   Laterality: Bilateral;   SKIN BIOPSY     THYROID  SURGERY     R lobe removed- 1972, has grown back - CT last done- 2018   TOTAL HIP ARTHROPLASTY  12/22/2011   Procedure: TOTAL HIP ARTHROPLASTY;  Surgeon: Dempsey JINNY Sensor, MD;  Location: [facility];  Service: Orthopedics;  Laterality: Left;   TOTAL HIP ARTHROPLASTY Right 11/12/2023   Procedure: ARTHROPLASTY, HIP, TOTAL, ANTERIOR APPROACH;  Surgeon: Fidel Rogue, MD;  Location: [facility];  Service: Orthopedics;  Laterality: Right;   TOTAL HIP REVISION Right 12/03/2023   Procedure: TOTAL HIP REVISION;  Surgeon: Fidel Rogue, MD;  Location: [facility];  Service:  Orthopedics;  Laterality: Right;  Right acetabular revision Anterior approach   TOTAL SHOULDER ARTHROPLASTY     TUBAL LIGATION     UPPER GASTROINTESTINAL ENDOSCOPY  2012   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   WRIST SURGERY Left 09/16/2022   wrist/arm   Social History:  reports that she quit smoking about 42 years ago. Her smoking use included cigarettes. She started smoking about 62 years ago. She has a 50 pack-year smoking history. She has never been exposed to tobacco smoke. She has never used smokeless tobacco. She reports current alcohol  use of about 14.0 standard drinks of alcohol  per week. She reports that she does not use drugs.  Family / Support Systems Marital Status: Married Patient Roles: Spouse, Parent Spouse/Significant Other: Sherri [name] [phone] Children: Sherri [name]-daughter [phone]  Son lives nearby Other Supports: Hired caregivers 24/7 prior to this admission unsure will keep them at [place] depends upon progress here Anticipated Caregiver: Husband, hired assist and children Ability/Limitations of Caregiver: Husband has health issues and can only provide supervision level Caregiver Availability: Other (Comment) (Await progress) Family Dynamics: Close with children and all have pulled together to make sure both pt and husband have their needs met. Pt did have 24/7 care prior to admission due  to falls and care.  Social History Preferred language: English Religion: Presbyterian Cultural Background: NA Education: HS Health Literacy - How often do you need to have someone help you when you read instructions, pamphlets, or other written material from your doctor or pharmacy?: Never Writes: Yes Employment Status: Retired Marine scientist Issues: NA Guardian/Conservator: None-according to MD pt is not fully capable of making her own decisions while here. Will look toward husband and children for decisions   Abuse/Neglect Abuse/Neglect Assessment Can Be Completed: Yes Physical Abuse: Denies Verbal Abuse: Denies Sexual Abuse: Denies Exploitation of patient/patient's resources: Denies Self-Neglect: Denies  Patient response to: Social Isolation - How often do you feel lonely or isolated from those around you?: Never  Emotional Status Pt's affect, behavior and adjustment status: Pt is motivated to do well and tired of having surgery. Husband reports she falls and breaks her bones her hip and now hip and elbow. Pt is at high risk to fall. Recent Psychosocial Issues: multiple hospital admissions due to falls and broken bones. Psychiatric History: No history is also very HOH may benefit from seeing neuro-psych while here. Will access if appropriate while here Substance Abuse History: NA  Patient / Family Perceptions, Expectations & Goals Pt/Family understanding of illness & functional limitations: Pt and husband can explain her fractures and issues with dislocations. Both hope she can lessen the risk and do better here on rehab. Has been to SNF and did not like and went home Premorbid pt/family roles/activities: wife, mom, retiree, grandmother Anticipated changes in roles/activities/participation: resume Pt/family expectations/goals: Pt states:  I hope to do well here.  Husband states:  We hope she will do well there, she did not like [facility].  Community  Resources [employer]: Other (Comment) (24/7 aide) Premorbid Home Care/DME Agencies: Other (Comment) ([facility] active with has numerous equipment) Transportation available at discharge: family Is the patient able to respond to transportation needs?: Yes In the past 12 months, has lack of transportation kept you from medical appointments or from getting medications?: No In the past 12 months, has lack of transportation kept you from meetings, work, or from getting things needed for daily living?: No Resource referrals recommended: Neuropsychology  Discharge Planning Living Arrangements: Spouse/significant other Support  Systems: Spouse/significant other, Children, Friends/neighbors Type of Residence: Private residence Insurance Resources: Media planner (specify) ([employer] Medicare) Financial Resources: Social Security, Family Support Financial Screen Referred: No Living Expenses: Own Money Management: Patient, Spouse Does the patient have any problems obtaining your medications?: No Home Management: hired assist Patient/Family Preliminary Plans: Return home with husband and caregivers if necessary. Will plan accordingly and go from here whether she will need more assist than husband can provide. Husband has health issues and can only provide supervision level Care Coordinator Barriers to Discharge: Insurance for SNF coverage, Decreased caregiver support, Weight bearing restrictions Care Coordinator Anticipated Follow Up Needs: HH/OP  Clinical Impression Pleasant female who is very HOH and having issues with her hearing aid today. Called her husband via telephone and obtained information. He hopes she does well here and progresses and does not need 24/7 care at home. Will await team's evaluations  [name] 12/09/2023, 2:31 PM

## 2023-12-09 NOTE — Evaluation (Signed)
 Physical Therapy Assessment and Plan  Patient Details  Name: Sherri [name] MRN: [id number] Date of Birth: [birth year]  PT Diagnosis: Abnormal posture, Abnormality of gait, Cognitive deficits, Difficulty walking, Edema, Impaired cognition, Impaired sensation, and Muscle weakness Rehab Potential: Fair ELOS: 12-14 days   Today's Date: 12/09/2023 PT Individual Time: 9154-9042, 8669-8584 PT Individual Time Calculation (min): 72 min, 45 min  PT missed time: 15 min (pt fatigue)   Hospital Problem: Principal Problem:   History of revision of total replacement of right hip joint Active Problems:   Postoperative wound infection   Closed fracture of right elbow   Past Medical History:  Past Medical History:  Diagnosis Date   Anxiety    takes Valium  daily as needed   Basal cell carcinoma 01/21/1988   left nostril (MOHS), sup-right calf (CX35FU)   Basal cell carcinoma 03/22/1996   right calf (CX35FU)   Basal cell carcinoma 03/31/1995   left wing nose   Bruises easily    d/t meds   Cancer (HCC)    basal cell ca, in situ- uterine    Cataracts, bilateral    removed bilateral   Chronic back pain    Dizziness    r/t to meds   GERD (gastroesophageal reflux disease)    no meds on a regular basis but will take Tums if needed   Headache(784.0)    r/t neck issues   History of bronchitis 6-14yrs ago   History of colon polyps    benign   HOH (hard of hearing)    wears hearing aids   Hyperlipidemia    takes Atorvastatin  on Mondays and Fridays   Hypertension    Joint pain    Joint swelling    Multiple sclerosis (HCC)    Neuromuscular disorder (HCC)    Dr. FABIENE Crete- [facility], follows [facility]   Nocturia    PONV (postoperative nausea and vomiting)    trouble urinating after surgery in 2014   Postoperative nausea and vomiting 01/11/2019   Rheumatoid arthritis (HCC)    Dr Lorrayne weekly, RA- hands- knees- feet    Rheumatoid arthritis(714.0)    Dr Dolphus takes  Xeljanz  daily   Right wrist fracture    Skin cancer    Squamous cell carcinoma of skin 11/02/2001   in situ-right knee (cx57fu)   Squamous cell carcinoma of skin 11/12/2011   in situ-left forearm (CX35FU), in situ-left foot (CX35FU)   Squamous cell carcinoma of skin 01/22/2012   in situ-left lower forearm (txpbx)   Squamous cell carcinoma of skin 11/17/2012   right shin (txpbx)   Squamous cell carcinoma of skin 10/28/2013   in situ-right shoulder (CX35FU)   Squamous cell carcinoma of skin 04/28/2014   well diff-right shin (txpbx)   Squamous cell carcinoma of skin 11/02/2014   in situ-Left shin (txpbx)   Squamous cell carcinoma of skin 12/15/2014   in situ-Left hand (txpbx), bowens-left side chest (txpbx)   Squamous cell carcinoma of skin 05/09/2015   in situ-left hand (txpbx)    Squamous cell carcinoma of skin 05/07/2016   in situ-left inner shin,ant, in situ-left inner shin, post, in situ-left outer forearm, in situ-right knuckle   Squamous cell carcinoma of skin 03/1302018   in situ-left shoulder (txpbx), in situ-right inner shin (txpbx), in situ-top of left foot (txpbx), KA- left forearm (txpbx)   Squamous cell carcinoma of skin 12/02/2016   in situ-left inner shin (txpbx)   Squamous cell carcinoma of skin 03/04/2017   in situ-left  outer sup, shin (txpbx), in situ- right 2nd knuckle finger (txpbx), in situ- Left wrist (txpbx)   Squamous cell carcinoma of skin 04/06/2018   in situ-above left knee inner (txpbx)   Squamous cell carcinoma of skin 04/21/2018   in situ-right top hand (txpbx)   Squamous cell carcinoma of skin 07/08/2018   in situ-right lower inner shin (txpbx)   Squamous cell carcinoma of skin 04/27/2019   in situ- Left neck(CX35FU), In situ- right neck (CX35FU)   Urinary retention    sees Dr.Wrenn about 2 times a yr   Past Surgical History:  Past Surgical History:  Procedure Laterality Date   ABDOMINAL HYSTERECTOMY     1985   ANTERIOR CERVICAL  DECOMP/DISCECTOMY FUSION N/A 04/23/2022   Procedure: Anterior Cervical Decompression/Discectomy Fusion - Cervical Seven-Thoracic One;  Surgeon: Louis Shove, MD;  Location: [facility];  Service: Neurosurgery;  Laterality: N/A;  3C   APPENDECTOMY     with TAH   BACK SURGERY     several   BREAST BIOPSY Right    Results were negative   BROW LIFT Bilateral 10/02/2016   Procedure: BILATERAL LOWER LID BLEPHAROPLASTY;  Surgeon: Laurie Loyd Redhead, MD;  Location: [facility] OR;  Service: Plastics;  Laterality: Bilateral;   CARPAL TUNNEL RELEASE Right    cataracts     CERVICAL FUSION     Dr Leeann   CERVICAL FUSION  12/31/2012   Dr Leeann   CERVICAL FUSION  04/2022   CHEST TUBE INSERTION     for traumatic Pneumothorax   COLONOSCOPY  07/16/2010   normal    COLONOSCOPY W/ POLYPECTOMY  1997   negative since; Dr Debrah   ESOPHAGOGASTRODUODENOSCOPY  07/16/2010   normal   eye lid raise     EYE SURGERY Bilateral    cataracts removed - /w IOL   HARDWARE REMOVAL  08/2021   HIP CLOSED REDUCTION Right 11/16/2023   Procedure: CLOSED MANIPULATION, JOINT, HIP;  Surgeon: Ernie Cough, MD;  Location: [facility];  Service: Orthopedics;  Laterality: Right;   HIP CLOSED REDUCTION N/A 11/30/2023   Procedure: CLOSED REDUCTION, HIP;  Surgeon: Fidel Rogue, MD;  Location: [facility];  Service: Orthopedics;  Laterality: N/A;   INCISION AND DRAINAGE OF WOUND Right 11/28/2023   Procedure: IRRIGATION AND DEBRIDEMENT WOUND; REMOVAL OF HARDWARE;  Surgeon: Edna Toribio LABOR, MD;  Location: [facility];  Service: Orthopedics;  Laterality: Right;  I & D RIGHT ELBOW   JOINT REPLACEMENT     LUMBAR FUSION     Dr Leeann   NASAL SINUS SURGERY     ORIF ELBOW FRACTURE Right 11/20/2023   Procedure: OPEN REDUCTION INTERNAL FIXATION (ORIF) ELBOW/OLECRANON FRACTURE;  Surgeon: [name] Franky SQUIBB, MD;  Location: [facility];  Service: Orthopedics;  Laterality: Right;   POSTERIOR CERVICAL FUSION/FORAMINOTOMY N/A 12/31/2012   Procedure: POSTERIOR LATERAL CERVICAL  FUSION/FORAMINOTOMY LEVEL 1 CERVICAL THREE-FOUR WITH LATERAL MASS SCREWS;  Surgeon: Catalina CHRISTELLA Leeann, MD;  Location: [facility];  Service: Neurosurgery;  Laterality: N/A;   PTOSIS REPAIR Bilateral 10/02/2016   Procedure: INTERNAL PTOSIS REPAIR;  Surgeon: Laurie Loyd Redhead, MD;  Location: [facility];  Service: Plastics;  Laterality: Bilateral;   SKIN BIOPSY     THYROID  SURGERY     R lobe removed- 1972, has grown back - CT last done- 2018   TOTAL HIP ARTHROPLASTY  12/22/2011   Procedure: TOTAL HIP ARTHROPLASTY;  Surgeon: Dempsey JINNY Sensor, MD;  Location: [facility];  Service: Orthopedics;  Laterality: Left;   TOTAL HIP  ARTHROPLASTY Right 11/12/2023   Procedure: ARTHROPLASTY, HIP, TOTAL, ANTERIOR APPROACH;  Surgeon: Fidel Rogue, MD;  Location: [facility];  Service: Orthopedics;  Laterality: Right;   TOTAL HIP REVISION Right 12/03/2023   Procedure: TOTAL HIP REVISION;  Surgeon: Fidel Rogue, MD;  Location: [facility];  Service: Orthopedics;  Laterality: Right;  Right acetabular revision Anterior approach   TOTAL SHOULDER ARTHROPLASTY     TUBAL LIGATION     UPPER GASTROINTESTINAL ENDOSCOPY  2012   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   WRIST SURGERY Left 09/16/2022   wrist/arm    Assessment & Plan Clinical Impression: Patient is a 83 y.o. year old female with PMHx significant for anxiety, dementia, GERD, rheumatoid arthritis on methotrexate  and prednisone , with osteoporosis, most likely; , HTN, multiple sclerosis, lipidemia, chronic pain, R olecranon fx (5/14), right femoral neck fx (6/17), right THA (6/19), dislocated THA (6/22), closed reduction right THA (6/23), and ORIF right olecranon (6/27).  Patient presented to [facility] on 11/26/2023 due to exposed surgical hardware and right elbow from previous elbow surgery 1 week prior. Orthopedics consulted.  X-ray showed fracture of right elbow with malposition screw and the patient underwent I&D with hardware removal, olecranon excision and triceps advancement on  11/28/2023 by Dr.Marchwiany.  Started on IV antibiotics for cellulitis, transitioned to doxy and Duricef for 4-6 weeks.  Labs in the ED showed hyponatremia 129 WBC 11.8 hemoglobin 8.1. Hemoglobin trended down to 6.8, and she received two units PRBC.    Additionally, the patient was found to have right lower extremity shortening, and an X-ray revealed a right hip dislocation. A closed hip reduction was attempted but was unsuccessful on 11/30/2023. Subsequently, the patient underwent a right total hip arthroplasty (THA) revision with head, ball, and liner change by Dr. Cherylene on 12/03/2023. Orthopedic surgery recommended aspirin  for 4 weeks. She will continue oral antibiotics for 4 to 6 weeks for cellulitis. Prior to arrival, the patient was living in a one-level home, able to live on the main level, using a walker. Currently requiring max assistance plus two for physical assistance, mod assist +2 for safety and equipment, using a rolling walker for ambulation. Therapy evaluations completed due to patient decreased functional mobility was admitted for a comprehensive rehab program.    Patient currently requires mod with mobility secondary to muscle weakness and muscle joint tightness, decreased cardiorespiratoy endurance, decreased awareness, decreased problem solving, decreased safety awareness, and decreased memory, and decreased sitting balance, decreased standing balance, decreased postural control, decreased balance strategies, and difficulty maintaining precautions.  Prior to initial hospitalization in may patient was independent  with mobility and lived with Spouse in a House home.  Home access is  Ramped entrance.  Patient will benefit from skilled PT intervention to maximize safe functional mobility, minimize fall risk, and decrease caregiver burden for planned discharge home with 24 hour assist.  Anticipate patient will benefit from follow up [facility] at discharge.  PT - End of Session Activity Tolerance:  Tolerates 30+ min activity with multiple rests Endurance Deficit: Yes PT Assessment Rehab Potential (ACUTE/IP ONLY): Fair PT Barriers to Discharge: Home environment access/layout;Incontinence;Wound Care;Lack of/limited family support;Weight bearing restrictions PT Barriers to Discharge Comments: wound vac, J tube, MASD, adherence to precautions, cognitive deficits at baseline PT Patient demonstrates impairments in the following area(s): Balance;Edema;Endurance;Motor;Pain;Safety;Skin Integrity;Sensory PT Transfers Functional Problem(s): Bed Mobility;Bed to Chair;Car PT Locomotion Functional Problem(s): Ambulation;Wheelchair Mobility PT Plan PT Intensity: Minimum of 1-2 x/day ,45 to 90 minutes PT Frequency: 5 out of 7  days PT Duration Estimated Length of Stay: 12-14 days PT Treatment/Interventions: Ambulation/gait training;DME/adaptive equipment instruction;Community reintegration;Neuromuscular re-education;Psychosocial support;Stair training;UE/LE Strength taining/ROM;Wheelchair propulsion/positioning;Balance/vestibular training;Discharge planning;Pain management;Skin care/wound management;Therapeutic Activities;UE/LE Coordination activities;Cognitive remediation/compensation;Disease management/prevention;Functional mobility training;Splinting/orthotics;Patient/family education;Therapeutic Exercise;Visual/perceptual remediation/compensation PT Transfers Anticipated Outcome(s): supervision/min A PT Locomotion Anticipated Outcome(s): supervision/min A PT Recommendation Recommendations for Other Services: Speech consult Equipment Recommended: To be determined Equipment Details: need to verify with family   PT Evaluation Precautions/Restrictions Precautions Precautions: Fall;Posterior Hip Precaution Booklet Issued: No Recall of Precautions/Restrictions: Impaired Precaution/Restrictions Comments: R JP drain, RLE wound vac, MASD Required Braces or Orthoses: Splint/Cast Splint/Cast:  RUE Restrictions Weight Bearing Restrictions Per Provider Order: Yes RUE Weight Bearing Per Provider Order: Partial weight bearing RUE Partial Weight Bearing Percentage or Pounds: okay to use walker RLE Weight Bearing Per Provider Order: Weight bearing as tolerated Other Position/Activity Restrictions: posterior hip precautions Pain Interference Pain Interference Pain Effect on Sleep: 1. Rarely or not at all Pain Interference with Therapy Activities: 4. Almost constantly Pain Interference with Day-to-Day Activities: 4. Almost constantly Home Living/Prior Functioning Home Living Family/patient expects to be discharged to:: Private residence Living Arrangements: Spouse/significant other Available Help at Discharge: Family;Available PRN/intermittently;Available 24 hours/day;Personal care attendant (personal care attendant present 24/7) Type of Home: House Home Access: Ramped entrance Home Layout: Able to live on main level with bedroom/bathroom Alternate Level Stairs-Number of Steps: flight Bathroom Shower/Tub: Walk-in shower;Tub/shower unit Bathroom Toilet: Handicapped height Bathroom Accessibility: Yes Additional Comments: Per dtr, 24/7 caregivers hired or family.  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  Lives With: Spouse Prior Function Level of Independence: Other (comment) (need to verify with pt family)  Able to Take Stairs?: No Driving: No Vocation: Retired Optometrist - History Ability to See in Adequate Light: 0 Adequate Perception Perception: Within Functional Limits Praxis Praxis: WFL  Cognition Overall Cognitive Status: History of cognitive impairments - at baseline (baseline dementia) Arousal/Alertness: Awake/alert Orientation Level: Oriented X4 Year: 2025 Month: July Day of Week: Correct Sensation Sensation Light Touch: Impaired by gross assessment Additional Comments: significant MASD B buttocks with rash, light touch intact in all dermatomes B  LE Coordination Gross Motor Movements are Fluid and Coordinated: No Coordination and Movement Description: limited 2/2 posterior hip precautions, generalized debility Motor  Motor Motor: Other (comment) Motor - Skilled Clinical Observations: generalized debility, R LE posterior hip precautions, R LE WBAT,  Trunk/Postural Assessment  Cervical Assessment Cervical Assessment: Exceptions to [facility] (forward head) Thoracic Assessment Thoracic Assessment: Exceptions to [facility] (kyphosis, rounded shoulders) Lumbar Assessment Lumbar Assessment: Exceptions to [facility] (posterior pelvic tilt) Postural Control Postural Control: Deficits on evaluation Righting Reactions: delayed Protective Responses: delayed Postural Limitations: delayed  Balance Balance Balance Assessed: Yes Static Sitting Balance Static Sitting - Balance Support: Feet supported Static Sitting - Level of Assistance: 6: Modified independent (Device/Increase time) (supervision/CGA) Dynamic Sitting Balance Dynamic Sitting - Balance Support: Feet supported;During functional activity Dynamic Sitting - Level of Assistance: 4: Min assist (reciprocal scoot) Static Standing Balance Static Standing - Balance Support: Bilateral upper extremity supported;During functional activity Static Standing - Level of Assistance: 4: Min assist (posterior bias) Dynamic Standing Balance Dynamic Standing - Balance Support: Bilateral upper extremity supported;During functional activity Dynamic Standing - Level of Assistance: 3: Mod assist;1: +2 Total assist (+2 mod A) Dynamic Standing - Comments: stand pivot transfer to L with RW Extremity Assessment  RLE Assessment RLE Assessment: Exceptions to [facility] Active Range of Motion (AROM) Comments: posterior hip precautions General Strength Comments: assessed in sitting, grossly 2/5 LLE Assessment LLE Assessment: Exceptions to [facility]  General Strength Comments: grossly 4-/5  Care Tool Care Tool Bed Mobility Roll left  and right activity    Min A     Sit to lying activity    Mod A     Lying to sitting on side of bed activity   Min A      Care Tool Transfers Sit to stand transfer    Min A     Chair/bed transfer    Mod A     Car transfer    Safety/Medical (pain/fatigue/dizziness)      Care Tool Locomotion Ambulation    Safety/Medical (pain/fatigue/dizziness)      Walk 10 feet activity    Safety/Medical (pain/fatigue/dizziness)     Walk 50 feet with 2 turns activity    Safety/Medical (pain/fatigue/dizziness)    Walk 150 feet activity    Safety/Medical (pain/fatigue/dizziness)    Walk 10 feet on uneven surfaces activity    Safety/Medical (pain/fatigue/dizziness)    Stairs    Safety/Medical (pain/fatigue/dizziness)      Walk up/down 1 step activity    Safety/Medical (pain/fatigue/dizziness)    Walk up/down 4 steps activity    Safety/Medical (pain/fatigue/dizziness)    Walk up/down 12 steps activity    Safety/Medical (pain/fatigue/dizziness)    Pick up small objects from floor    Total A     Wheelchair      Dependent       Wheel 50 feet with 2 turns activity    Dependent   Wheel 150 feet activity    Dependent     Refer to Care Plan for Long Term Goals  SHORT TERM GOAL WEEK 1 PT Short Term Goal 1 (Week 1): pt will perform sit to stand with LRAD and CGA PT Short Term Goal 2 (Week 1): pt will perform bed to chair transfer with RW and min A PT Short Term Goal 3 (Week 1): pt will ambuate 5 feet with LRAD and min A  Recommendations for other services: Neuropsych and Therapeutic Recreation  Stress management  Skilled Therapeutic Intervention Mobility Bed Mobility Bed Mobility: Supine to Sit;Sit to Supine Supine to Sit: Minimal Assistance - Patient > 75% Sit to Supine: Moderate Assistance - Patient 50-74% Transfers Transfers: Sit to Stand;Stand to Sit;Stand Pivot Transfers Sit to Stand: Minimal Assistance - Patient > 75% (from raised bed) Stand to Sit: Minimal  Assistance - Patient > 75% Stand Pivot Transfers: Moderate Assistance - Patient 50 - 74% Stand Pivot Transfer Details: Verbal cues for precautions/safety;Verbal cues for safe use of DME/AE;Verbal cues for technique;Verbal cues for sequencing Transfer (Assistive device): Rolling walker Locomotion  Gait Ambulation: No (pain/fatigue) Gait Gait: No Stairs / Additional Locomotion Stairs: No Wheelchair Mobility Wheelchair Mobility: Yes Wheelchair Assistance: Dependent - Patient 0% Wheelchair Parts Management: Needs assistance   Discharge Criteria: Patient will be discharged from PT if patient refuses treatment 3 consecutive times without medical reason, if treatment goals not met, if there is a change in medical status, if patient makes no progress towards goals or if patient is discharged from hospital.  The above assessment, treatment plan, treatment alternatives and goals were discussed and mutually agreed upon: by patient and by family  Today's Interventions   Treatment Session 1  Evaluation completed (see details above and below) with education on PT POC and goals and individual treatment initiated with focus on transfer training.   Pt required extensive time to provide pt with appropriate DME. Pt current WC 18x18 hemi height. PT provided pt with 16x16 standard height  WC for improved positioning and adherence to posterior hip precautions. PT provided pt with roho cushion for pressure relief 2/2 significant MASD, and pressure injury. Provided pt with youth RW.   Bed mobility: supine to sit with min A, sit to supine with mod A for B LE management, verbal cues provided for technique for adherence to posterior hip precautions  Sit to stand: min A from elevated hospital bed with RW, pt strongly favoring L LE.   Stand pivot transfer with +2 mod A to L with RW, PT guarding for R LE buckling.   Verified PLOF and DME with pt son at end of session. Pt has WC, RW, lift chair, lift bed, ramp  entry, BSC, and shower chair.   Pt seated in [facility] with all needs within reach and seatbelt alarm on.   Treatment Session 2   Pt asleep in bed upon arrival. Pt agreeable to therapy. Pt denies any pain.   Pt performed supine to sit with mod A. Pt performed stand pivot transfer with RW and +1 max A.   Attempted sit to stand in // bars to initiate gait training however pt unable to stand 2/2 fatigue.   Pt performed sit to stand with RW and min A from WC. Pt strongly favoring L LE, with significnat R knee flexion. Pt performed terminal knee extension x5 prior to needing seated rest break.   Pt active knee extension limited to ~20 deg while seated, pt passive knee extension lacking ~10 deg.   Pt performed seated quad sets holding for 5 seconds, and seated ankle pumps x20 with leg in extension  Pt performed low load long duration hamstring stretch x2 min with therapist managing R LE for adherence to precautions.   Education provided for purpose and implementation of posterior hip precautions. Handout provided.    Pt seated in WC at end of session with all needs within reach and seatbelt alrm on.   Pt missed 15 min 2/2 fatigue.     [facility] [place], [place], DPT  12/09/2023, 9:50 AM

## 2023-12-09 NOTE — Evaluation (Addendum)
 Occupational Therapy Assessment and Plan  Patient Details  Name: Sherri [name]  Sherri [name] MRN: [id number] Date of Birth: 1940/09/01  OT Diagnosis: abnormal posture, altered mental status, muscular wasting and disuse atrophy, muscle weakness (generalized), pain in joint, and swelling of limb Rehab Potential: Rehab Potential (ACUTE ONLY): Good ELOS:     Today's Date: 12/09/2023 OT Individual Time: 8951-8797 OT Individual Time Calculation (min): 74 min     Hospital Problem: Principal Problem:   History of revision of total replacement of right hip joint Active Problems:   Postoperative wound infection   Closed fracture of right elbow   Past Medical History:  Past Medical History:  Diagnosis Date   Anxiety    takes Valium  daily as needed   Basal cell carcinoma 01/21/1988   left nostril (MOHS), sup-right calf (CX35FU)   Basal cell carcinoma 03/22/1996   right calf (CX35FU)   Basal cell carcinoma 03/31/1995   left wing nose   Bruises easily    d/t meds   Cancer (HCC)    basal cell ca, in situ- uterine    Cataracts, bilateral    removed bilateral   Chronic back pain    Dizziness    r/t to meds   GERD (gastroesophageal reflux disease)    no meds on a regular basis but will take Tums if needed   Headache(784.0)    r/t neck issues   History of bronchitis 6-24yrs ago   History of colon polyps    benign   HOH (hard of hearing)    wears hearing aids   Hyperlipidemia    takes Atorvastatin  on Mondays and Fridays   Hypertension    Joint pain    Joint swelling    Multiple sclerosis (HCC)    Neuromuscular disorder (HCC)    Dr. FABIENE Crete- [facility], follows [facility]   Nocturia    PONV (postoperative nausea and vomiting)    trouble urinating after surgery in 2014   Postoperative nausea and vomiting 01/11/2019   Rheumatoid arthritis (HCC)    Dr Lorrayne weekly, RA- hands- knees- feet    Rheumatoid arthritis(714.0)    Dr Dolphus takes Xeljanz  daily   Right wrist  fracture    Skin cancer    Squamous cell carcinoma of skin 11/02/2001   in situ-right knee (cx19fu)   Squamous cell carcinoma of skin 11/12/2011   in situ-left forearm (CX35FU), in situ-left foot (CX35FU)   Squamous cell carcinoma of skin 01/22/2012   in situ-left lower forearm (txpbx)   Squamous cell carcinoma of skin 11/17/2012   right shin (txpbx)   Squamous cell carcinoma of skin 10/28/2013   in situ-right shoulder (CX35FU)   Squamous cell carcinoma of skin 04/28/2014   well diff-right shin (txpbx)   Squamous cell carcinoma of skin 11/02/2014   in situ-Left shin (txpbx)   Squamous cell carcinoma of skin 12/15/2014   in situ-Left hand (txpbx), bowens-left side chest (txpbx)   Squamous cell carcinoma of skin 05/09/2015   in situ-left hand (txpbx)    Squamous cell carcinoma of skin 05/07/2016   in situ-left inner shin,ant, in situ-left inner shin, post, in situ-left outer forearm, in situ-right knuckle   Squamous cell carcinoma of skin 03/1302018   in situ-left shoulder (txpbx), in situ-right inner shin (txpbx), in situ-top of left foot (txpbx), KA- left forearm (txpbx)   Squamous cell carcinoma of skin 12/02/2016   in situ-left inner shin (txpbx)   Squamous cell carcinoma of skin 03/04/2017   in situ-left outer sup,  shin (txpbx), in situ- right 2nd knuckle finger (txpbx), in situ- Left wrist (txpbx)   Squamous cell carcinoma of skin 04/06/2018   in situ-above left knee inner (txpbx)   Squamous cell carcinoma of skin 04/21/2018   in situ-right top hand (txpbx)   Squamous cell carcinoma of skin 07/08/2018   in situ-right lower inner shin (txpbx)   Squamous cell carcinoma of skin 04/27/2019   in situ- Left neck(CX35FU), In situ- right neck (CX35FU)   Urinary retention    sees Dr.Wrenn about 2 times a yr   Past Surgical History:  Past Surgical History:  Procedure Laterality Date   ABDOMINAL HYSTERECTOMY     1985   ANTERIOR CERVICAL DECOMP/DISCECTOMY FUSION N/A 04/23/2022    Procedure: Anterior Cervical Decompression/Discectomy Fusion - Cervical Seven-Thoracic One;  Surgeon: Louis Shove, MD;  Location: [facility];  Service: Neurosurgery;  Laterality: N/A;  3C   APPENDECTOMY     with TAH   BACK SURGERY     several   BREAST BIOPSY Right    Results were negative   BROW LIFT Bilateral 10/02/2016   Procedure: BILATERAL LOWER LID BLEPHAROPLASTY;  Surgeon: Laurie Loyd Redhead, MD;  Location: [facility] OR;  Service: Plastics;  Laterality: Bilateral;   CARPAL TUNNEL RELEASE Right    cataracts     CERVICAL FUSION     Dr Leeann   CERVICAL FUSION  12/31/2012   Dr Leeann   CERVICAL FUSION  04/2022   CHEST TUBE INSERTION     for traumatic Pneumothorax   COLONOSCOPY  07/16/2010   normal    COLONOSCOPY W/ POLYPECTOMY  1997   negative since; Dr Debrah   ESOPHAGOGASTRODUODENOSCOPY  07/16/2010   normal   eye lid raise     EYE SURGERY Bilateral    cataracts removed - /w IOL   HARDWARE REMOVAL  08/2021   HIP CLOSED REDUCTION Right 11/16/2023   Procedure: CLOSED MANIPULATION, JOINT, HIP;  Surgeon: Ernie Cough, MD;  Location: [facility];  Service: Orthopedics;  Laterality: Right;   HIP CLOSED REDUCTION N/A 11/30/2023   Procedure: CLOSED REDUCTION, HIP;  Surgeon: Fidel Rogue, MD;  Location: [facility];  Service: Orthopedics;  Laterality: N/A;   INCISION AND DRAINAGE OF WOUND Right 11/28/2023   Procedure: IRRIGATION AND DEBRIDEMENT WOUND; REMOVAL OF HARDWARE;  Surgeon: Edna Toribio LABOR, MD;  Location: [facility];  Service: Orthopedics;  Laterality: Right;  I & D RIGHT ELBOW   JOINT REPLACEMENT     LUMBAR FUSION     Dr Leeann   NASAL SINUS SURGERY     ORIF ELBOW FRACTURE Right 11/20/2023   Procedure: OPEN REDUCTION INTERNAL FIXATION (ORIF) ELBOW/OLECRANON FRACTURE;  Surgeon: [name] Franky SQUIBB, MD;  Location: [facility];  Service: Orthopedics;  Laterality: Right;   POSTERIOR CERVICAL FUSION/FORAMINOTOMY N/A 12/31/2012   Procedure: POSTERIOR LATERAL CERVICAL FUSION/FORAMINOTOMY LEVEL 1 CERVICAL  THREE-FOUR WITH LATERAL MASS SCREWS;  Surgeon: Catalina CHRISTELLA Leeann, MD;  [facility]: [facility];  Service: Neurosurgery;  Laterality: N/A;   PTOSIS REPAIR Bilateral 10/02/2016   Procedure: INTERNAL PTOSIS REPAIR;  Surgeon: Laurie Loyd Redhead, MD;  Location: [facility];  Service: Plastics;  Laterality: Bilateral;   SKIN BIOPSY     THYROID  SURGERY     R lobe removed- 1972, has grown back - CT last done- 2018   TOTAL HIP ARTHROPLASTY  12/22/2011   Procedure: TOTAL HIP ARTHROPLASTY;  Surgeon: Dempsey JINNY Sensor, MD;  Location: [facility];  Service: Orthopedics;  Laterality: Left;   TOTAL HIP ARTHROPLASTY Right  11/12/2023   Procedure: ARTHROPLASTY, HIP, TOTAL, ANTERIOR APPROACH;  Surgeon: Fidel Rogue, MD;  Location: [facility];  Service: Orthopedics;  Laterality: Right;   TOTAL HIP REVISION Right 12/03/2023   Procedure: TOTAL HIP REVISION;  Surgeon: Fidel Rogue, MD;  Location: [facility];  Service: Orthopedics;  Laterality: Right;  Right acetabular revision Anterior approach   TOTAL SHOULDER ARTHROPLASTY     TUBAL LIGATION     UPPER GASTROINTESTINAL ENDOSCOPY  2012   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   WRIST SURGERY Left 09/16/2022   wrist/arm    Assessment & Plan Clinical Impression: Patient is a 83 y.o. R handed  female with PMHx significant for anxiety, dementia, GERD, rheumatoid arthritis on methotrexate  and prednisone , with osteoporosis, most likely; , HTN, multiple sclerosis, lipidemia, chronic pain, R olecranon fx (5/14), right femoral neck fx (6/17), right THA (6/19), dislocated THA (6/22), closed reduction right THA (6/23), and ORIF right olecranon (6/27).  Patient presented to [facility] on 11/26/2023 due to exposed surgical hardware and right elbow from previous elbow surgery 1 week prior. Orthopedics consulted.  X-ray showed fracture of right elbow with malposition screw and the patient underwent I&D with hardware removal, olecranon excision and triceps advancement on 11/28/2023 by Dr.Marchwiany.  Started on  IV antibiotics for cellulitis, transitioned to doxy and Duricef for 4-6 weeks.  Labs in the ED showed hyponatremia 129 WBC 11.8 hemoglobin 8.1. Hemoglobin trended down to 6.8, and she received two units PRBC.    Additionally, the patient was found to have right lower extremity shortening, and an X-ray revealed a right hip dislocation. A closed hip reduction was attempted but was unsuccessful on 11/30/2023. Subsequently, the patient underwent a right total hip arthroplasty (THA) revision with head, ball, and liner change by Dr. Cherylene on 12/03/2023. Orthopedic surgery recommended aspirin  for 4 weeks. She will continue oral antibiotics for 4 to 6 weeks for cellulitis. Prior to arrival, the patient was living in a one-level home, able to live on the main level, using a walker. Currently requiring max assistance plus two for physical assistance, mod assist +2 for safety and equipment, using a rolling walker for ambulation. Therapy evaluations completed due to patient decreased functional mobility was admitted for a comprehensive rehab program.        Pt reports LBM was today, however was like passing a truck.  Frustrated about urinary incontinence due to not strong enough to get to [facility] fast enough. Wants Purewick- explained can do at night, but not during day.    Has a painful MASD on buttocks.  [name] is very raw per daughter as well.      Pain is controlled better- and not taking IV pain meds now- spiked last night, but has been good today. Still 5-6/10 at all times. Is tolerable..    Last got pRBCs yesterday. Feeling better after transfusion. JP drain still draining bloody drainage per pt- is being emptied regularly.  Patient currently requires max with basic self-care skills secondary to muscle weakness, decreased cardiorespiratoy endurance, decreased problem solving and decreased safety awareness, and decreased standing balance, decreased postural control, decreased balance strategies, and  difficulty maintaining precautions.  Prior to initial hospitalization, patient could complete basic ADLs with independent .  Patient will benefit from skilled intervention to decrease level of assist with basic self-care skills, increase independence with basic self-care skills, and increase level of independence with iADL prior to discharge home with care partner.  Anticipate patient will require 24 hour supervision and follow  up home health.  OT - End of Session Activity Tolerance: Tolerates 30+ min activity with multiple rests Endurance Deficit: Yes OT Assessment Rehab Potential (ACUTE ONLY): Good OT Patient demonstrates impairments in the following area(s): Motor;Endurance;Safety;Perception;Pain;Balance;Cognition OT Basic ADL's Functional Problem(s): Grooming;Bathing;Dressing;Toileting OT Transfers Functional Problem(s): Tub/Shower;Toilet OT Additional Impairment(s): Fuctional Use of Upper Extremity OT Plan OT Intensity: Minimum of 1-2 x/day, 45 to 90 minutes OT Duration/Estimated Length of Stay: 12-14 days OT Treatment/Interventions: Balance/vestibular training;Discharge planning;Pain management;Therapeutic Activities;UE/LE Coordination activities;Self Care/advanced ADL retraining;Skin care/wound managment;Patient/family education;Functional mobility training;Disease mangement/prevention;Cognitive remediation/compensation;Therapeutic Exercise;Wheelchair propulsion/positioning;UE/LE Strength taining/ROM;Psychosocial support;DME/adaptive equipment instruction;Community reintegration OT Self Feeding Anticipated Outcome(s): mod I OT Basic Self-Care Anticipated Outcome(s): CG to min A OT Toileting Anticipated Outcome(s): CG to min A OT Bathroom Transfers Anticipated Outcome(s): CG to min A OT Recommendation Recommendations for Other Services: Neuropsych consult Patient destination: Home Follow Up Recommendations: Home health OT Equipment Recommended: 3 in 1 bedside comode   OT  Evaluation Precautions/Restrictions  Precautions Precautions: Fall;Posterior Hip;Other (comment) (PWB RUE- only for walking) Precaution Booklet Issued: No Recall of Precautions/Restrictions: Impaired Precaution/Restrictions Comments: R JP drain, RLE wound vac, MASD Required Braces or Orthoses: Splint/Cast Knee Immobilizer - Right: On at all times Splint/Cast: RUE Restrictions Weight Bearing Restrictions Per Provider Order: Yes RUE Weight Bearing Per Provider Order: Partial weight bearing RUE Partial Weight Bearing Percentage or Pounds: okay to use walker RLE Weight Bearing Per Provider Order: Weight bearing as tolerated Other Position/Activity Restrictions: posterior hip precautions General Response to Previous Treatment: Not applicable Family/Caregiver Present: No Vital Signs   Pain Pain Assessment Pain Scale: 0-10 Pain Score: 5  Pain Type: Acute pain Pain Location: Other (Comment) (groin R side, femur, elbow) Pain Orientation: Right Pain Intervention(s): Medication (See eMAR) Home Living/Prior Functioning Home Living Family/patient expects to be discharged to:: Private residence Living Arrangements: Spouse/significant other Available Help at Discharge: Family, Available PRN/intermittently, Available 24 hours/day, Personal care attendant Type of Home: House Home Access: Ramped entrance Home Layout: Able to live on main level with bedroom/bathroom Alternate Level Stairs-Number of Steps: flight Bathroom Shower/Tub: Psychologist, counselling, Nurse, adult Toilet: Handicapped height Bathroom Accessibility: Yes Additional Comments: Per dtr, 24/7 caregivers hired or family.  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  Lives With: Spouse IADL History Homemaking Responsibilities: Yes Shopping Responsibility: Secondary Prior Function Level of Independence: Independent with basic ADLs, Independent with transfers, Other (comment) (check with family for mobility)  Able to Take Stairs?: No Driving:  No Vocation: Retired Administrator, sports Baseline Vision/History: 4 Cataracts Ability to See in Adequate Light: 0 Adequate Patient Visual Report: No change from baseline Vision Assessment?: No apparent visual deficits Perception  Perception: Within Functional Limits Praxis Praxis: WFL Cognition Cognition Overall Cognitive Status: History of cognitive impairments - at baseline Arousal/Alertness: Awake/alert Safety/Judgment: Impaired Brief Interview for Mental Status (BIMS) Repetition of Three Words (First Attempt): 3 Temporal Orientation: Year: Correct Temporal Orientation: Month: Accurate within 5 days Temporal Orientation: Day: Correct Recall: Sock: Yes, no cue required Recall: Blue: Yes, no cue required Recall: Bed: Yes, after cueing (a piece of furniture) BIMS Summary Score: 14 Sensation Sensation Light Touch: Impaired by gross assessment Additional Comments: significant MASD B buttocks with rash, light touch intact in all dermatomes B LE Coordination Gross Motor Movements are Fluid and Coordinated: No Fine Motor Movements are Fluid and Coordinated: No Coordination and Movement Description: limited 2/2 posterior hip precautions, generalized debility Motor  Motor Motor: Other (comment) (impaired motor secondary to pain and decreaed strength) Motor - Skilled Clinical Observations: generalized debility, R LE posterior  hip precautions, R LE WBAT,  Trunk/Postural Assessment  Cervical Assessment Cervical Assessment: Exceptions to [facility] (forward head) Thoracic Assessment Thoracic Assessment: Exceptions to [facility] (kyphosis) Lumbar Assessment Lumbar Assessment: Exceptions to [facility] (posterior pelvic tilt) Postural Control Postural Control: Deficits on evaluation Righting Reactions: delayed Protective Responses: delayed Postural Limitations: delayed  Balance Balance Balance Assessed: Yes Static Sitting Balance Static Sitting - Balance Support: Feet supported Static Sitting - Level  of Assistance: 6: Modified independent (Device/Increase time) Dynamic Sitting Balance Dynamic Sitting - Balance Support: Feet supported;During functional activity Dynamic Sitting - Level of Assistance: 4: Min Oncologist Standing - Balance Support: Bilateral upper extremity supported;During functional activity Static Standing - Level of Assistance: 4: Min assist Dynamic Standing Balance Dynamic Standing - Balance Support: Bilateral upper extremity supported;During functional activity Dynamic Standing - Level of Assistance: 2: Max assist Dynamic Standing - Comments: stand pivot with RW to toilet, max A for all dynamic standing Extremity/Trunk Assessment RUE Assessment RUE Assessment: Exceptions to [facility] Active Range of Motion (AROM) Comments: shoulder ROM wfl, unable to test elbow General Strength Comments: shoulder 4-/5, elbow NT due to cast finger strength LUE Assessment LUE Assessment: Within Functional Limits Active Range of Motion (AROM) Comments: WFL General Strength Comments: 4-/5  Care Tool Care Tool Self Care Eating   Eating Assist Level: Set up assist    Oral Care    Oral Care Assist Level: Minimal Assistance - Patient > 75%    Bathing   Body parts bathed by patient: Right arm;Left upper leg;Left arm;Chest;Abdomen;Front perineal area;Right upper leg;Face Body parts bathed by helper: Right lower leg;Left lower leg;Buttocks;Front perineal area   Assist Level: Moderate Assistance - Patient 50 - 74%    Upper Body Dressing(including orthotics)   What is the patient wearing?: Pull over shirt   Assist Level: Set up assist    Lower Body Dressing (excluding footwear)   What is the patient wearing?: Pants Assist for lower body dressing: Maximal Assistance - Patient 25 - 49%    Putting on/Taking off footwear   What is the patient wearing?: Shoes Assist for footwear: Total Assistance - Patient < 25%       Care Tool Toileting Toileting activity    Assist for toileting: Maximal Assistance - Patient 25 - 49%     Care Tool Bed Mobility Roll left and right activity   Roll left and right assist level: Minimal Assistance - Patient > 75%    Sit to lying activity   Sit to lying assist level: Moderate Assistance - Patient 50 - 74%    Lying to sitting on side of bed activity   Lying to sitting on side of bed assist level: the ability to move from lying on the back to sitting on the side of the bed with no back support.: Minimal Assistance - Patient > 75%     Care Tool Transfers Sit to stand transfer   Sit to stand assist level: Minimal Assistance - Patient > 75%    Chair/bed transfer   Chair/bed transfer assist level: Maximal Assistance - Patient 25 - 49%     Toilet transfer   Assist Level: Maximal Assistance - Patient 24 - 49%     Care Tool Cognition  Expression of Ideas and Wants Expression of Ideas and Wants: 4. Without difficulty (complex and basic) - expresses complex messages without difficulty and with speech that is clear and easy to understand  Understanding Verbal and Non-Verbal Content Understanding Verbal and Non-Verbal Content: 4.  Understands (complex and basic) - clear comprehension without cues or repetitions   Memory/Recall Ability Memory/Recall Ability : Current season;That he or she is in a hospital/hospital unit;Staff names and faces   Refer to Care Plan for Long Term Goals  SHORT TERM GOAL WEEK 1 OT Short Term Goal 1 (Week 1): patient will demonstrate increased static standing balance to complete LB ADLs OT Short Term Goal 2 (Week 1): patient will demo increase in use of RUE as stabilizer to increase ADL participation OT Short Term Goal 3 (Week 1): patient will demo ability to don pants mod A with AE.  Recommendations for other services: Neuropsych   Skilled Therapeutic Intervention  Evaluation completed (see details above) with education on OT POC and goals and individual treatment initiated with focus  on functional mobility/transfers, ADL re-training,  generalized strengthening and endurance, dynamic standing balance/coordination, pt/family education and discharge planning. Pt education provided on therapy schedule and safety policy with use of chair alarm. Pt participated in goal setting. Pt participated in modified ADL task (See above for functional performance).    ADL ADL Equipment Provided: Reacher Eating: Set up Where Assessed-Eating: Wheelchair Grooming: Minimal assistance Where Assessed-Grooming: Wheelchair Upper Body Bathing: Minimal assistance Where Assessed-Upper Body Bathing: Wheelchair Lower Body Bathing: Maximal assistance Where Assessed-Lower Body Bathing: Sitting at sink Upper Body Dressing: Setup Where Assessed-Upper Body Dressing: Wheelchair Lower Body Dressing: Maximal assistance Where Assessed-Lower Body Dressing: Sitting at sink;Standing at sink Toileting: Maximal assistance Where Assessed-Toileting: Teacher, adult education: Maximal Dentist Method: Surveyor, minerals: Raised toilet seat Film/video editor: Maximal TEFL teacher Method: Warden/ranger: Shower seat with back Mobility  Bed Mobility Bed Mobility: Supine to Sit;Sit to Supine Supine to Sit: Minimal Assistance - Patient > 75% Sit to Supine: Moderate Assistance - Patient 50-74% Transfers Sit to Stand: Minimal Assistance - Patient > 75% Stand to Sit: Minimal Assistance - Patient > 75%   Discharge Criteria: Patient will be discharged from OT if patient refuses treatment 3 consecutive times without medical reason, if treatment goals not met, if there is a change in medical status, if patient makes no progress towards goals or if patient is discharged from hospital.  The above assessment, treatment plan, treatment alternatives and goals were discussed and mutually agreed upon: by patient  D'mariea L  Mallorey Odonell 12/09/2023, 11:58 AM

## 2023-12-09 NOTE — Progress Notes (Signed)
 Inpatient Rehabilitation Center Individual Statement of Services  Patient Name:  [name]  A Riker  Date:  12/09/2023  Welcome to the Inpatient Rehabilitation Center.  Our goal is to provide you with an individualized program based on your diagnosis and situation, designed to meet your specific needs.  With this comprehensive rehabilitation program, you will be expected to participate in at least 3 hours of rehabilitation therapies Monday-Friday, with modified therapy programming on the weekends.  Your rehabilitation program will include the following services:  Physical Therapy (PT), Occupational Therapy (OT), 24 hour per day rehabilitation nursing, Therapeutic Recreaction (TR), Neuropsychology, Care Coordinator, Rehabilitation Medicine, Nutrition Services, and Pharmacy Services  Weekly team conferences will be held on Wednesday to discuss your progress.  Your Inpatient Rehabilitation Care Coordinator will talk with you frequently to get your input and to update you on team discussions.  Team conferences with you and your family in attendance may also be held.  Expected length of stay: 12-14 days  Overall anticipated outcome: supervision-min needs cues  Depending on your progress and recovery, your program may change. Your Inpatient Rehabilitation Care Coordinator will coordinate services and will keep you informed of any changes. Your Inpatient Rehabilitation Care Coordinator's name and contact numbers are listed  below.  The following services may also be recommended but are not provided by the Inpatient Rehabilitation Center:   Home Health Rehabiltiation Services Outpatient Rehabilitation Services    Arrangements will be made to provide these services after discharge if needed.  Arrangements include referral to agencies that provide these services.  Your insurance has been verified to be:  [facility] Medicare Your primary doctor is:  Glade Hope  Pertinent information will be shared with your  doctor and your insurance company.  Inpatient Rehabilitation Care Coordinator:  [name], KEN [phone] or [name]  Information discussed with and copy given to patient by: [name], 12/09/2023, 2:33 PM

## 2023-12-09 NOTE — Patient Care Conference (Signed)
 Inpatient RehabilitationTeam Conference and Plan of Care Update Date: 12/09/2023   Time: 12:04 PM    Patient Name: Sherri [name]      Medical Record Number: [id number]  Date of Birth: June 08, 1940 Sex: Female         Room/Bed: 4W03C/4W03C-01 Payor Info: Payor: [employer]vertising copy[employer] MEDICARE / Plan: [employer] MEDICARE / Product Type: *No Product type* /    Admit Date/Time:  12/08/2023  4:46 PM  Primary Diagnosis:  History of revision of total replacement of right hip joint  Hospital Problems: Principal Problem:   History of revision of total replacement of right hip joint Active Problems:   Postoperative wound infection   Closed fracture of right elbow    Expected Discharge Date: Expected Discharge Date:  (Evals pending)  Team Members Present: Physician leading conference: Dr. Murray Collier Social Worker Present: [name], LCSW Nurse Present: Barnie Ronde, RN PT Present: [name], PT OT Present: Other (comment) ANCEL Seip OT) PPS Coordinator present : Eleanor Colon, SLP     Current Status/Progress Goal Weekly Team Focus  Bowel/Bladder      Constipation;  Urinary retention history, on Oxybutynin     Bowel managed with mod I assist and bladder w toileting    Toileting protocol  Swallow/Nutrition/ Hydration     Dysphagia    Currently on a HH regular texture/thin liquid diet per choice    SLP recommended nectar thickened liquids  ADL's   max A   CG to min A   precautions, increased functional mobility, ADL training    Mobility   supine to sit min A, sit to supine mod A, sit to stand min A from elevated bed with RW, stand pivot transfer with RW and +2 mod A--R LE buckling.   supervision/min A  ELOS 12-14 days, confirmed with pt son: pt has WC, RW, lift chair, lift bed, BSC, shower chiar build into shower),  follow up HHPT, barriers:baseline dementia, wound vac, J tube, adherence to precautions, dizziness, weight bearing restrictions, R LE buckling     Communication                Safety/Cognition/ Behavioral Observations               Pain      Pain in right side; hx. Of RA and MS    Pain controlled < 4 with prn meds   PRN pain meds and scheduled medications. Assess pain q shift and effectiveness of prn meds   Skin      Pressure injury on coccyx Sutures right  elbow and hip. MASD to buttocks       Assess skin q shift and incisions; wound treatment plan initiation for coccyx    Discharge Planning:  New evaluation today-according to acute chart going home with 24/7 caregivers has been to SNF had a bad experience. Active with [facility] services   Team Discussion: Patient post ORIF right elbow with I+D; hardware removal and Right THA revision. Progress limited by hip precautions, right LE buckling and dementia with wound vac and fine motor coordination issues.   Patient on target to meet rehab goals: Patient needs set up for upper body care; right UE issues limit use. Needs max assist for lower body care and toileting.  Goals for discharge set for min assist overall.  *See Care Plan and progress notes for long and short-term goals.   Revisions to Treatment Plan:  Posterior Hip precautions   Teaching Needs: Safety, medications, skin care, transfers,  toileting, etc.   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, OAB, HTN, MASD,constipation, dementia, anemia Possible Resolutions to [employer] Focus: wound/skin care, monitor CBC, monitor BM, pain control, safety  education   Continued Need for Acute Rehabilitation Level of Care: The patient requires daily medical management by a physician with specialized training in physical medicine and rehabilitation for the following reasons: Direction of a multidisciplinary physical rehabilitation program to maximize functional independence : Yes Medical management of patient stability for increased activity during participation in an intensive rehabilitation regime.: Yes Analysis of laboratory values and/or radiology reports with any subsequent need for medication adjustment and/or medical intervention. : Yes   I attest that I was present, lead the team conference, and concur with the assessment and plan of the team.   Fredericka Sober B 12/09/2023, 2:51 PM

## 2023-12-10 DIAGNOSIS — R112 Nausea with vomiting, unspecified: Secondary | ICD-10-CM | POA: Diagnosis not present

## 2023-12-10 DIAGNOSIS — E876 Hypokalemia: Secondary | ICD-10-CM

## 2023-12-10 DIAGNOSIS — R1310 Dysphagia, unspecified: Secondary | ICD-10-CM

## 2023-12-10 DIAGNOSIS — K5900 Constipation, unspecified: Secondary | ICD-10-CM | POA: Diagnosis not present

## 2023-12-10 DIAGNOSIS — I10 Essential (primary) hypertension: Secondary | ICD-10-CM | POA: Diagnosis not present

## 2023-12-10 DIAGNOSIS — Z96641 Presence of right artificial hip joint: Secondary | ICD-10-CM | POA: Diagnosis not present

## 2023-12-10 LAB — CBC WITH DIFFERENTIAL/PLATELET
Abs Immature Granulocytes: 0.13 K/uL — ABNORMAL HIGH (ref 0.00–0.07)
Basophils Absolute: 0 K/uL (ref 0.0–0.1)
Basophils Relative: 0 %
Eosinophils Absolute: 0.1 K/uL (ref 0.0–0.5)
Eosinophils Relative: 1 %
HCT: 28.2 % — ABNORMAL LOW (ref 36.0–46.0)
Hemoglobin: 9.5 g/dL — ABNORMAL LOW (ref 12.0–15.0)
Immature Granulocytes: 2 %
Lymphocytes Relative: 8 %
Lymphs Abs: 0.7 K/uL (ref 0.7–4.0)
MCH: 32.2 pg (ref 26.0–34.0)
MCHC: 33.7 g/dL (ref 30.0–36.0)
MCV: 95.6 fL (ref 80.0–100.0)
Monocytes Absolute: 0.5 K/uL (ref 0.1–1.0)
Monocytes Relative: 6 %
Neutro Abs: 7.4 K/uL (ref 1.7–7.7)
Neutrophils Relative %: 83 %
Platelets: 240 K/uL (ref 150–400)
RBC: 2.95 MIL/uL — ABNORMAL LOW (ref 3.87–5.11)
RDW: 19.2 % — ABNORMAL HIGH (ref 11.5–15.5)
WBC: 8.8 K/uL (ref 4.0–10.5)
nRBC: 0 % (ref 0.0–0.2)

## 2023-12-10 LAB — BASIC METABOLIC PANEL WITH GFR
Anion gap: 9 (ref 5–15)
BUN: 6 mg/dL — ABNORMAL LOW (ref 8–23)
CO2: 25 mmol/L (ref 22–32)
Calcium: 7.9 mg/dL — ABNORMAL LOW (ref 8.9–10.3)
Chloride: 98 mmol/L (ref 98–111)
Creatinine, Ser: 0.64 mg/dL (ref 0.44–1.00)
GFR, Estimated: >60 mL/min (ref >60)
Glucose, Bld: 86 mg/dL (ref 70–99)
Potassium: 3.2 mmol/L — ABNORMAL LOW (ref 3.5–5.1)
Sodium: 132 mmol/L — ABNORMAL LOW (ref 135–145)

## 2023-12-10 MED ORDER — POTASSIUM CHLORIDE CRYS ER 20 MEQ PO TBCR
30.0000 meq | EXTENDED_RELEASE_TABLET | Freq: Two times a day (BID) | ORAL | Status: AC
  Administered 2023-12-10 (×2): 30 meq via ORAL
  Filled 2023-12-10 (×2): qty 1

## 2023-12-10 MED ORDER — ATORVASTATIN CALCIUM 10 MG PO TABS
10.0000 mg | ORAL_TABLET | Freq: Every evening | ORAL | Status: DC
  Administered 2023-12-11 – 2023-12-22 (×12): 10 mg via ORAL
  Filled 2023-12-10 (×13): qty 1

## 2023-12-10 MED ORDER — GERHARDT'S BUTT CREAM
TOPICAL_CREAM | Freq: Three times a day (TID) | CUTANEOUS | Status: DC
  Administered 2023-12-10 – 2023-12-23 (×4): 1 via TOPICAL
  Filled 2023-12-10 (×2): qty 60

## 2023-12-10 MED ORDER — OXYBUTYNIN CHLORIDE 5 MG PO TABS
5.0000 mg | ORAL_TABLET | Freq: Every evening | ORAL | Status: DC
  Administered 2023-12-11 – 2023-12-12 (×2): 5 mg via ORAL
  Filled 2023-12-10 (×2): qty 1

## 2023-12-10 NOTE — Progress Notes (Signed)
 PROGRESS NOTE   Subjective/Complaints: Pt had nausea/vomiting that occurred suddenly this AM. She says this has happened 3 times since being in the hospital, always in AM and generally after AM medications.  No additional concerns.   ROS: Patient denies fever, new vision changes, diarrhea,  shortness of breath or chest pain, headache, or mood change. + chronic joint pain +nausea, vomiting,    Objective:   No results found. Recent Labs    12/08/23 0905 12/10/23 0436  WBC 7.4 8.8  HGB 10.5* 9.5*  HCT 31.2* 28.2*  PLT 330 240   Recent Labs    12/09/23 0427 12/10/23 0436  NA 139 132*  K 3.5 3.2*  CL 103 98  CO2 25 25  GLUCOSE 80 86  BUN 5* 6*  CREATININE 0.58 0.64  CALCIUM  8.2* 7.9*    Intake/Output Summary (Last 24 hours) at 12/10/2023 0939 Last data filed at 12/10/2023 0800 Gross per 24 hour  Intake 720 ml  Output --  Net 720 ml        Physical Exam: Vital Signs Blood pressure (!) 150/73, pulse 94, temperature 98.2 F (36.8 C), temperature source Oral, resp. rate 16, SpO2 97%.  General: appears uncomfortable from nausea HEENT: Head is normocephalic, atraumatic, sclera anicteric, oral mucosa overall dry Neck: Supple without JVD or lymphadenopathy Heart: Reg rate and rhythm. No murmurs rubs or gallops Chest: CTA bilaterally without wheezes, rales, or rhonchi; no distress Abdomen: Soft, non-tender, non-distended, bowel sounds positive. Extremities: No clubbing, cyanosis, or edema.  Psych: Pt's affect is appropriate. Pt is cooperative Skin: Has a rash type bruising on L side of face consistent with the intubation for last surgery; skin appears pale, ecchymosis noted on chest wall and all 4 extremities   Severely bruised/purple R labia Significant MASD and raw buttocks and genitals/perineum- not checked today Neuro:   AAOx3, sensation intact in all 4 extremities to LT, hard of hearing  Prior neuro  assessment is c/w today's exam 12/10/2023.   MSK:  Arthritic changes noted in her hands RUE in large cast wrapped with ACE wrap R leg has drain with small amount sanguinous drainage in bulb and VAC also has small amount drainage in it RUE- 4-4+/5 R deltoids and can wiggle fingers LUE 4+/5 throughout RLE- HF 2/5;  hard to bend knee, but at least 3/5;DF/PF 4/5 LLE- 5-/5 throughout     Assessment/Plan: 1. Functional deficits which require 3+ hours per day of interdisciplinary therapy in a comprehensive inpatient rehab setting. Physiatrist is providing close team supervision and 24 hour management of active medical problems listed below. Physiatrist and rehab team continue to assess barriers to discharge/monitor patient progress toward functional and medical goals  Care Tool:  Bathing    Body parts bathed by patient: Right arm, Left upper leg, Left arm, Chest, Abdomen, Front perineal area, Right upper leg, Face   Body parts bathed by helper: Right lower leg, Left lower leg, Buttocks, Front perineal area     Bathing assist Assist Level: Moderate Assistance - Patient 50 - 74%     Upper Body Dressing/Undressing Upper body dressing   What is the patient wearing?: Pull over shirt    Upper  body assist Assist Level: Set up assist    Lower Body Dressing/Undressing Lower body dressing      What is the patient wearing?: Pants     Lower body assist Assist for lower body dressing: Maximal Assistance - Patient 25 - 49%     Toileting Toileting    Toileting assist Assist for toileting: Maximal Assistance - Patient 25 - 49%     Transfers Chair/bed transfer  Transfers assist     Chair/bed transfer assist level: Maximal Assistance - Patient 25 - 49%     Locomotion Ambulation   Ambulation assist   Ambulation activity did not occur: Safety/medical concerns          Walk 10 feet activity   Assist  Walk 10 feet activity did not occur: Safety/medical concerns         Walk 50 feet activity   Assist Walk 50 feet with 2 turns activity did not occur: Safety/medical concerns         Walk 150 feet activity   Assist Walk 150 feet activity did not occur: Safety/medical concerns         Walk 10 feet on uneven surface  activity   Assist Walk 10 feet on uneven surfaces activity did not occur: Safety/medical concerns         Wheelchair     Assist Is the patient using a wheelchair?: Yes Type of Wheelchair: Manual    Wheelchair assist level: Dependent - Patient 0%      Wheelchair 50 feet with 2 turns activity    Assist        Assist Level: Dependent - Patient 0%   Wheelchair 150 feet activity     Assist      Assist Level: Dependent - Patient 0%   Blood pressure (!) 150/73, pulse 94, temperature 98.2 F (36.8 C), temperature source Oral, resp. rate 16, SpO2 97%.  Medical Problem List and Plan: 1. 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 She also had R elbow hardware removal and olecranon excision and triceps advancement 11/28/23.             -patient may not  shower due to VAC             -ELOS/Goals: 12-14 days- Superviison to min A WBAT with walker RLE-posterior hip precautions Partial weightbearing RUE okay to use walker per Dr. Edna- s/p R elbow hardware removal - Message ortho to confirm wt bearing use with walker- OK for weightbearing but no bending elbow-discussed with Dr. Edna, appreciate assistance.  Okay to remove splint when thermoplastic splint is ready.  Okay to remove this for wound care and hygiene otherwise needs to stay on.  -Continue CIR  2.  Antithrombotics: -DVT/anticoagulation:  Mechanical:  Antiembolism stockings, knee (TED hose) Bilateral lower extremities Sequential compression devices, below knee Bilateral lower extremities             -antiplatelet therapy: Aspirin  81 mg held due to bleeding risk  3. Pain Management:  Oxycodone  and Tylenol  as needed             - Continue Cymbalta  20 mg daily and Flexeril  10 mg at bedtime 4. Mood/Behavior/Sleep: Therapy evaluations completed due to patient decreased functional mobility was admitted for a comprehensive rehab program.              -antipsychotic agents: N/A 5. Neuropsych/cognition: This patient is intermittently capable of making decisions on her  own behalf. 6. Skin/Wound Care: Routine pressure relief measures             -JP drain-per Ortho will discontinue when output < 10 cc per shift             -Continue negative pressure at 75 mmHg--can be exchanged to Prevena  - MASD-7/15 start Diflucan  200 mg then 100 mg x 6 days -Gerhardt's              -Diffuse ecchymoses- Platelets 330 CTM Routine labs, follow-up with derm/hematology outpatient. 7. Fluids/Electrolytes/Nutrition: Monitor I's and O's, recheck labs tomorrow             -Diet Heart Healthy-- SLP recc nectar thick- pt not agreeable  -Ensure High protein + Zinc  and vitamin C  -Overactive Bladder- cont Oxybutynin   8. Hypertension: Continue Coreg  3.125 mg             -7/16-17 BP intermittently elevated, continue to monitor trend for now      12/10/2023    4:55 AM 12/09/2023    8:03 PM 12/09/2023   12:47 PM  Vitals with BMI  Systolic 150 133 861  Diastolic 73 75 70  Pulse 94 95 85    9. Rheumatoid arthritis-continue home prednisone  5 mg daily and methotrexate - methotrexate  just restarted.  10. Microcytic anemia/acute blood loss anemia: monitor for signs of bleeding recheck CBC in a.m   -normal folate & B12, iron study suggestive of AOC, status post 3 units PRBC. -7/17 Stable HGB at 9.5, continue to monitor  11. Severe MASD_ will try Diflucan  200 mg x1 and 100 mg daily x 6 days and monitor skin 12. Constipation- needs colace added 13. IV doesn't look great- will remove  -order placed 14.  Constipation  - 7/16 reports improvement after multiple bowel movements yesterday  -7/17 LMB 7/15, continue  to monitor 15. Dysphagia, chronic   -Prior SLP eval re nectar thick, she has been refusing modification/nectar thick liquids 16. Hypokalemia/Hyponateria  -7/17 potassium x2 today, recheck tomorrow  17. Nausea  -compazine  PRN    LOS: 2 days A FACE TO FACE EVALUATION WAS PERFORMED  Murray Collier 12/10/2023, 9:39 AM

## 2023-12-10 NOTE — Progress Notes (Signed)
 Occupational Therapy Session Note  Patient Details  Name: Sherri [name]  DAJANEE VOORHEIS MRN: [id number] Date of Birth: 10/13/1940  Today's Date: 12/10/2023 OT Individual Time: 9184-9084 OT Individual Time Calculation (min): 60 min    Short Term Goals: Week 1:  OT Short Term Goal 1 (Week 1): patient will demonstrate increased static standing balance to complete LB ADLs OT Short Term Goal 2 (Week 1): patient will demo increase in use of RUE as stabilizer to increase ADL participation OT Short Term Goal 3 (Week 1): patient will demo ability to don pants mod A with AE.  Skilled Therapeutic Interventions/Progress Updates:    Patient received supine in bed, agreeable to OT session.  Patient's brief soaked, and used rolling and bridging to aide with process to remove wet brief, wash/dry, and apply new pull up and pants  (Patient with red raw bottom, reports soreness.  Barrier cream applied.) Patient assisted to edge of bed - patient fearful of falling off the bed when in the middle of the bed - reassured her she was not in danger of falling.  Patient reports h/o falling from bed following surgery requiring additional surgery.  Patient sat at edge of bed and donned pull over shirt.  Stepped out of the room to obtain needed items while doctor rounding on patient.  Upon return - doctor requets emesis bag as patient throwing up.  Cleaned patient, and MD ordered medication for nausea.  Patient took several minutes to completely quiet gagging then coughing.  Patient completed squat pivot transfer to wheelchair with max assist.  Patient felt she could stand and get to chair with minimal assistance and was surprised when needing extra help due to LE weakness.  Patient left up in wheelchair with safety belt in place and engaged and personal items in reach.    Therapy Documentation Precautions:  Precautions Precautions: Fall, Posterior Hip, Other (comment) (PWB RUE- only for walking) Precaution Booklet Issued: No Recall of  Precautions/Restrictions: Impaired Precaution/Restrictions Comments: R JP drain, RLE wound vac, MASD Required Braces or Orthoses: Splint/Cast Knee Immobilizer - Right: On at all times Splint/Cast: RUE Restrictions Weight Bearing Restrictions Per Provider Order: Yes RUE Weight Bearing Per Provider Order: Partial weight bearing RUE Partial Weight Bearing Percentage or Pounds: okay to use walker, confirmed with MD/ortho, okay to weight bear right UE as long as R elbow remains extended RLE Weight Bearing Per Provider Order: Weight bearing as tolerated Other Position/Activity Restrictions: posterior hip precautions   Pain: Pain Assessment Pain Scale: 0-10 Pain Score: 5  Pain Location: Hip Pain Intervention(s): repositioned from bed to chair ADL:  Therapy/Group: Individual Therapy  Annika Selke M 12/10/2023, 12:17 PM

## 2023-12-10 NOTE — Progress Notes (Signed)
 Occupational Therapy Session Note  Patient Details  Name: Sherri [name] MRN: [id number] Date of Birth: 12-Apr-1941  Session 1: Today's Date: 12/10/2023 OT Individual Time: 1001-1030 OT Individual Time Calculation (min): 29 min   Session 2: Today's Date: 12/10/2023 OT Individual Time: 8597-8496 OT Individual Time Calculation (min): 61 min   Short Term Goals: Week 1:  OT Short Term Goal 1 (Week 1): patient will demonstrate increased static standing balance to complete LB ADLs OT Short Term Goal 2 (Week 1): patient will demo increase in use of RUE as stabilizer to increase ADL participation OT Short Term Goal 3 (Week 1): patient will demo ability to don pants mod A with AE.  Session 1: Skilled Therapeutic Interventions/Progress Updates:  Patient received in wc. Upon OT arrival patient reported increased fatigue and drowsiness. Patient educated on safety precautions for transfers. Patient completed transfer from wc to bed and practiced bed mobility while maintain precautions with pillow to block hip IR>. Patient completed standing side stepping within precautions with RW for increased mobility with increased anxiety.  patient able to complete with min A. OT completed measurements for leg length to determine cause of buckling with functional mobility.   Session 2: Skilled Therapeutic Interventions/Progress Updates:  Patient agreeable to participate in OT session. Reports no pain level.   Patient participated in skilled OT session focusing on functional mobility, transfer training, and UE strengthening. Patient able to complete UE strengthening no weight with cast 2x15 of shoulder to increase tolerance to mobility. Patient completed functional mobility in parallel bars 3x5 ft forward and back with mod to max A with max verbal cues. Patient required increased functional rest breaks. Completed bed mobility min to mod A.   Therapy Documentation Precautions:  Precautions Precautions: Fall,  Posterior Hip, Other (comment) (PWB RUE- only for walking) Precaution Booklet Issued: No Recall of Precautions/Restrictions: Impaired Precaution/Restrictions Comments: R JP drain, RLE wound vac, MASD Required Braces or Orthoses: Splint/Cast Knee Immobilizer - Right: On at all times Splint/Cast: RUE Restrictions Weight Bearing Restrictions Per Provider Order: Yes RUE Weight Bearing Per Provider Order: Partial weight bearing RUE Partial Weight Bearing Percentage or Pounds: okay to use walker, confirmed with MD/ortho, okay to weight bear right UE as long as R elbow remains extended RLE Weight Bearing Per Provider Order: Weight bearing as tolerated Other Position/Activity Restrictions: posterior hip precautions  Therapy/Group: Individual Therapy  D'mariea L Idania Desouza 12/10/2023, 7:47 AM

## 2023-12-10 NOTE — Progress Notes (Signed)
 Physical Therapy Session Note  Patient Details  Name: Sherri [name] MRN: [id number] Date of Birth: 11-21-1940  Today's Date: 12/10/2023 PT Individual Time: 0915-0957 PT Individual Time Calculation (min): 42 min   Short Term Goals: Week 1:  PT Short Term Goal 1 (Week 1): pt will perform sit to stand with LRAD and CGA PT Short Term Goal 2 (Week 1): pt will perform bed to chair transfer with RW and min A PT Short Term Goal 3 (Week 1): pt will ambuate 5 feet with LRAD and min A  Skilled Therapeutic Interventions/Progress Updates:      Pt seated in WC upon arrival. Pt agreeable to therapy. Pt reports 7/10 pain in R LE. Therapist provided rest breaks and repositioning.   Pt and previous OT informed PT of sudden emesis during previous session. Pt requesting to stay in room during session. No evidence of emesis during this session.   BP 110/60 HR 93, SpO2 RA 100  PT utilized therpeutic use of self to offer support and encouragement as pt expressed significant fear of falling. Pt overall required frequent seated rest breaks 2/2 fatigue and FOF.   Pt performed the following for exercises for improved R LE strength/ROM/weight shift to R LE/standing tolerance/activity tolerance:   -lateral weight shifting with RW and CGA with student PT providing visual demonstration  -R LE terminal knee extension against red theraband, verbal and tactile cues provided for upright posture and quad activation  -standing L LE marching (pt demonstrates limited clearance)=therapist providing verbal/tactile cues for lateral weight shift.   -1x10 active R LE LAQ  -1x10 active assisted R LE LAQ  1x20 heel toe raises B  Of note: PT noted leg length discrepancy of femurs while seated, R LE longer than L LE...may also be contributing to R knee flexion in standing in addition to hamstring tightness/edema/pain. Plan to assess leg length discrepancy next session.     Therapy Documentation Precautions:   Precautions Precautions: Fall, Posterior Hip, Other (comment) (PWB RUE- only for walking) Precaution Booklet Issued: No Recall of Precautions/Restrictions: Impaired Precaution/Restrictions Comments: R JP drain, RLE wound vac, MASD Required Braces or Orthoses: Splint/Cast Knee Immobilizer - Right: On at all times Splint/Cast: RUE Restrictions Weight Bearing Restrictions Per Provider Order: Yes RUE Weight Bearing Per Provider Order: Partial weight bearing RUE Partial Weight Bearing Percentage or Pounds: okay to use walker, confirmed with MD/ortho, okay to weight bear right UE as long as R elbow remains extended RLE Weight Bearing Per Provider Order: Weight bearing as tolerated Other Position/Activity Restrictions: posterior hip precautions  Therapy/Group: Individual Therapy  [facility] [name], [place], DPT  12/10/2023, 7:56 AM

## 2023-12-11 DIAGNOSIS — Z96641 Presence of right artificial hip joint: Secondary | ICD-10-CM | POA: Diagnosis not present

## 2023-12-11 DIAGNOSIS — I10 Essential (primary) hypertension: Secondary | ICD-10-CM | POA: Diagnosis not present

## 2023-12-11 DIAGNOSIS — R112 Nausea with vomiting, unspecified: Secondary | ICD-10-CM | POA: Diagnosis not present

## 2023-12-11 DIAGNOSIS — K5900 Constipation, unspecified: Secondary | ICD-10-CM | POA: Diagnosis not present

## 2023-12-11 DIAGNOSIS — E871 Hypo-osmolality and hyponatremia: Secondary | ICD-10-CM

## 2023-12-11 LAB — BASIC METABOLIC PANEL WITH GFR
Anion gap: 9 (ref 5–15)
BUN: 6 mg/dL — ABNORMAL LOW (ref 8–23)
CO2: 23 mmol/L (ref 22–32)
Calcium: 7.6 mg/dL — ABNORMAL LOW (ref 8.9–10.3)
Chloride: 96 mmol/L — ABNORMAL LOW (ref 98–111)
Creatinine, Ser: 0.8 mg/dL (ref 0.44–1.00)
GFR, Estimated: >60 mL/min (ref >60)
Glucose, Bld: 78 mg/dL (ref 70–99)
Potassium: 4.5 mmol/L (ref 3.5–5.1)
Sodium: 128 mmol/L — ABNORMAL LOW (ref 135–145)

## 2023-12-11 MED ORDER — POLYETHYLENE GLYCOL 3350 17 G PO PACK
17.0000 g | PACK | Freq: Every day | ORAL | Status: DC
  Administered 2023-12-11 – 2023-12-18 (×7): 17 g via ORAL
  Filled 2023-12-11 (×8): qty 1

## 2023-12-11 MED ORDER — BIOTENE DRY MOUTH MT LIQD: 15.0000 mL | OROMUCOSAL | Status: DC | PRN

## 2023-12-11 MED ORDER — SODIUM CHLORIDE 0.9 % IV SOLN: INTRAVENOUS | Status: DC

## 2023-12-11 MED ORDER — SORBITOL 70 % SOLN
30.0000 mL | Freq: Every day | Status: DC | PRN
  Administered 2023-12-19: 30 mL via ORAL
  Filled 2023-12-11: qty 30

## 2023-12-11 NOTE — Progress Notes (Addendum)
 PROGRESS NOTE   Subjective/Complaints: Nausea improved today. She reports pain is controlled . She had new UE splint made today.  LBM 7/15  ROS: Patient denies fever, new vision changes, diarrhea,  shortness of breath or chest pain, headache, or mood change. + chronic joint pain +nausea, vomiting- improved   Objective:   No results found. Recent Labs    12/10/23 0436  WBC 8.8  HGB 9.5*  HCT 28.2*  PLT 240   Recent Labs    12/10/23 0436 12/11/23 0609  NA 132* 128*  K 3.2* 4.5  CL 98 96*  CO2 25 23  GLUCOSE 86 78  BUN 6* 6*  CREATININE 0.64 0.80  CALCIUM  7.9* 7.6*    Intake/Output Summary (Last 24 hours) at 12/11/2023 1747 Last data filed at 12/11/2023 1732 Gross per 24 hour  Intake 720 ml  Output 105 ml  Net 615 ml        Physical Exam: Vital Signs Blood pressure 128/73, pulse 95, temperature 98.4 F (36.9 C), temperature source Oral, resp. rate 17, height 5' (1.524 m), weight 56.1 kg, SpO2 100%.  General: appears comfortable laying in bed HEENT: Head is normocephalic, atraumatic, sclera anicteric, oral mucosa dry Neck: Supple without JVD or lymphadenopathy Heart: Reg rate and rhythm. No murmurs rubs or gallops Chest: CTA bilaterally without wheezes, rales, or rhonchi; no distress Abdomen: Soft, non-tender, non-distended, bowel sounds positive. Extremities: + LE edema- pt reports chronic more at end of the day Psych: Pt's affect is appropriate. Pt is cooperative pleasant  Skin: Has a rash type bruising on L side of face consistent with the intubation for last surgery; skin appears pale, ecchymosis noted on chest wall and all 4 extremities   Severely bruised/purple R labia Significant MASD and raw buttocks and genitals/perineum- not checked today Neuro:   AAOx3, sensation intact in all 4 extremities to LT, hard of hearing  Prior neuro assessment is c/w today's exam 12/11/2023.   MSK:  Arthritic  changes noted in her hands RUE with splint and ace wrap R leg has drain with small amount sanguinous drainage in bulb and VAC also has small amount drainage in it RUE- 4-4+/5 R deltoids and can wiggle fingers LUE 4+/5 throughout RLE- HF 2/5;  hard to bend knee, but at least 3/5;DF/PF 4/5 LLE- 5-/5 throughout     Assessment/Plan: 1. Functional deficits which require 3+ hours per day of interdisciplinary therapy in a comprehensive inpatient rehab setting. Physiatrist is providing close team supervision and 24 hour management of active medical problems listed below. Physiatrist and rehab team continue to assess barriers to discharge/monitor patient progress toward functional and medical goals  Care Tool:  Bathing    Body parts bathed by patient: Right arm, Left upper leg, Left arm, Chest, Abdomen, Front perineal area, Right upper leg, Face   Body parts bathed by helper: Right lower leg, Left lower leg, Buttocks, Front perineal area     Bathing assist Assist Level: Moderate Assistance - Patient 50 - 74%     Upper Body Dressing/Undressing Upper body dressing   What is the patient wearing?: Pull over shirt    Upper body assist Assist Level: Set up assist  Lower Body Dressing/Undressing Lower body dressing      What is the patient wearing?: Pants, Incontinence brief     Lower body assist Assist for lower body dressing: Maximal Assistance - Patient 25 - 49%     Toileting Toileting Toileting Activity did not occur (Clothing management and hygiene only): N/A (no void or bm)  Toileting assist Assist for toileting: Maximal Assistance - Patient 25 - 49%     Transfers Chair/bed transfer  Transfers assist     Chair/bed transfer assist level: Moderate Assistance - Patient 50 - 74%     Locomotion Ambulation   Ambulation assist   Ambulation activity did not occur: Safety/medical concerns          Walk 10 feet activity   Assist  Walk 10 feet activity did not  occur: Safety/medical concerns        Walk 50 feet activity   Assist Walk 50 feet with 2 turns activity did not occur: Safety/medical concerns         Walk 150 feet activity   Assist Walk 150 feet activity did not occur: Safety/medical concerns         Walk 10 feet on uneven surface  activity   Assist Walk 10 feet on uneven surfaces activity did not occur: Safety/medical concerns         Wheelchair     Assist Is the patient using a wheelchair?: Yes Type of Wheelchair: Manual    Wheelchair assist level: Dependent - Patient 0%      Wheelchair 50 feet with 2 turns activity    Assist        Assist Level: Dependent - Patient 0%   Wheelchair 150 feet activity     Assist      Assist Level: Dependent - Patient 0%   Blood pressure 128/73, pulse 95, temperature 98.4 F (36.9 C), temperature source Oral, resp. rate 17, height 5' (1.524 m), weight 56.1 kg, SpO2 100%.  Medical Problem List and Plan: 1. 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 She also had R elbow hardware removal and olecranon excision and triceps advancement 11/28/23.             -patient may not  shower due to VAC             -ELOS/Goals: 12-14 days- Superviison to min A WBAT with walker RLE-posterior hip precautions Partial weightbearing RUE okay to use walker per Dr. Edna- s/p R elbow hardware removal - Message ortho to confirm wt bearing use with walker- OK for weightbearing but no bending elbow-discussed with Dr. Edna, appreciate assistance.  Okay to remove splint when thermoplastic splint is ready.  Okay to remove this for wound care and hygiene otherwise needs to stay on.  -Continue CIR  - Patient was seen by Kemp Hila Cockerham-possible suture removal Monday, plan for right upper extremity splint to be made today 12/11/2023  2.  Antithrombotics: -DVT/anticoagulation:  Mechanical:  Antiembolism  stockings, knee (TED hose) Bilateral lower extremities Sequential compression devices, below knee Bilateral lower extremities             -antiplatelet therapy: Aspirin  81 mg held due to bleeding risk  3. Pain Management: Oxycodone  and Tylenol  as needed             - Continue Cymbalta  20 mg daily and Flexeril  10 mg at bedtime 4. Mood/Behavior/Sleep: Therapy evaluations completed due to patient decreased functional mobility was  admitted for a comprehensive rehab program.              -antipsychotic agents: N/A 5. Neuropsych/cognition: This patient is intermittently capable of making decisions on her own behalf. 6. Skin/Wound Care: Routine pressure relief measures             -JP drain-per Ortho will discontinue when output < 10 cc per shift             -Continue negative pressure at 75 mmHg--can be exchanged to Prevena  - MASD-7/15 start Diflucan  200 mg then 100 mg x 6 days -Gerhardt's              -Diffuse ecchymoses- Platelets 330 CTM Routine labs, follow-up with derm/hematology outpatient. 7. Fluids/Electrolytes/Nutrition: Monitor I's and O's, recheck labs tomorrow             -Diet Heart Healthy-- SLP recc nectar thick- pt not agreeable  -Ensure High protein + Zinc  and vitamin C  -Overactive Bladder- cont Oxybutynin   8. Hypertension/ intermittent mild tachycarida: Continue Coreg  3.125 mg,              -7/19 BP controlled, continue to monitor,      12/11/2023    3:08 PM 12/11/2023    1:00 PM 12/11/2023    5:52 AM  Vitals with BMI  Height  5' 0   Weight   123 lbs 11 oz  BMI   23.38  Systolic 128  118  Diastolic 73  64  Pulse 95  104    9. Rheumatoid arthritis-continue home prednisone  5 mg daily and methotrexate - methotrexate  just restarted.  10. Microcytic anemia/acute blood loss anemia: monitor for signs of bleeding recheck CBC in a.m   -normal folate & B12, iron study suggestive of AOC, status post 3 units PRBC. -7/17 Stable HGB at 9.5 11. Severe MASD_ will try Diflucan  200 mg  x1 and 100 mg daily x 6 days and monitor skin 12. Constipation- senna  -7/18 miralrax daily, PRN sorbitol  13.  Constipation  - 7/16 reports improvement after multiple bowel movements yesterday  -7/17 LMB 7/15, continue to monitor  7/18 miralrax daily, PRN sorbitol  14. Dysphagia, chronic   -Prior SLP eval re nectar thick, she has been refusing modification/nectar thick liquids 15. Hypokalemia/Hyponateria  -7/17 potassium x2 today, recheck tomorrow 7/18 K+ up to 4.5, NA lower at 128  Check urine sodium/osmolality due to hyponatremia, asymptomatic,  Cr higher then her baseline at 0.8 today, HGB higher on 7/15 and 7/17 think she is a little fluid depleted, add NS 50ml/hr and monitor response  16. Nausea  -compazine  PRN  7/19 improved today, discussed with pharmacy to try to adjust medication timing.  Nursing order to try to do medications after breakfast    Addendum: social work note indicates step-grandson committed suicide yesterday   LOS: 3 days A FACE TO FACE EVALUATION WAS PERFORMED  Murray Collier 12/11/2023, 5:47 PM

## 2023-12-11 NOTE — Progress Notes (Signed)
 Occupational Therapy Session Note  Patient Details  Name: Sherri [name]  DELILAH MULGREW MRN: [id number] Date of Birth: 28-Nov-1940  Today's Date: 12/11/2023 OT Individual Time: 1100-1209 OT Individual Time Calculation (min): 69 min    Short Term Goals: Week 1:  OT Short Term Goal 1 (Week 1): patient will demonstrate increased static standing balance to complete LB ADLs OT Short Term Goal 2 (Week 1): patient will demo increase in use of RUE as stabilizer to increase ADL participation OT Short Term Goal 3 (Week 1): patient will demo ability to don pants mod A with AE.  Skilled Therapeutic Interventions/Progress Updates:    Patient received seated in wheelchair wrapped in blanket.  Son in room and indicates patient has not had pain medication.  Patient indicates pain 6/10 - spoke with nurse to see if patient could have pain medicine.  Pain medicine given.  Patient transported to gym to fabricate elbow splint.  Secure messaged physician to determine specifics of elbow splint - wanting extension, but not full extension to allow soft tissue healing.  Unwrapped cotton and removed half cast from dorsal right elbow.  Patient obtained small skin tear volar forearm when removing cotton batting.  Redressed incision site in xeroform petroleum dressing as previous, and wrapped arm in gauze to protect skin and create slight barrier to splint.  Fabricated volar splint to promote more extension but not full extension in right elbow.  Strapping to assist with more neutral rotation of forearm.  OT will recheck skin after 1 hour of wearing splint.  Patient with very frail skin - will need to monitor splint.   Patient returned to room by primary OT.    Therapy Documentation Precautions:  Precautions Precautions: Fall, Posterior Hip, Other (comment) (PWB RUE- only for walking) Precaution Booklet Issued: No Recall of Precautions/Restrictions: Impaired Precaution/Restrictions Comments: R JP drain, RLE wound vac, MASD Required  Braces or Orthoses: Splint/Cast Knee Immobilizer - Right: On at all times Splint/Cast: RUE Restrictions Weight Bearing Restrictions Per Provider Order: Yes RUE Weight Bearing Per Provider Order: Partial weight bearing RUE Partial Weight Bearing Percentage or Pounds: okay to use walker, confirmed with MD/ortho, okay to weight bear right UE as long as R elbow remains extended RLE Weight Bearing Per Provider Order: Weight bearing as tolerated Other Position/Activity Restrictions: posterior hip precautions   Pain: Pain Assessment Pain Scale: 0-10 Pain Score: 6  Pain Location: right hip Pain Intervention(s): Elevated extremity     Therapy/Group: Individual Therapy  Rodel Glaspy M 12/11/2023, 12:16 PM

## 2023-12-11 NOTE — Progress Notes (Signed)
 Occupational Therapy Session Note  Patient Details  Name: Sherri  DIMPLE [name] MRN: [id number] Date of Birth: Jan 26, 1941  Session 1: Today's Date: 12/11/2023 OT Individual Time: 8740-8641 OT Individual Time Calculation (min): 59 min   Short Term Goals: Week 1:  OT Short Term Goal 1 (Week 1): patient will demonstrate increased static standing balance to complete LB ADLs OT Short Term Goal 2 (Week 1): patient will demo increase in use of RUE as stabilizer to increase ADL participation OT Short Term Goal 3 (Week 1): patient will demo ability to don pants mod A with AE.  Session 1: Skilled Therapeutic Interventions/Progress Updates:  Patient agreeable to participate in OT session. Reports 4/10 pain level, premedicated before session.   Patient participated in skilled OT session focusing on orthotic re adjustment from previous session with OT for due to discomfort and skin irritation at wrist. Thermoplastic splint wrapped after being reapplied after adjustment at wrist joint. Patient reported increased comfort. Patient able to complete 4 stands with focus on leg length discrepancy and hip hike on L side due to instability. Patient able to complete stands with OT manual adjustment for proper positioning during stand to decrease risk of pelvic contracture formation. OT utilized step on L side to increase ability to stand evenly with partial success. OT will continue to assess patients ability to stand with good body mechanics due to observed buckle in legs during functional transfers leading to decreased safety.   Therapy Documentation Precautions:  Precautions Precautions: Fall, Posterior Hip, Other (comment) (PWB RUE- only for walking) Precaution Booklet Issued: No Recall of Precautions/Restrictions: Impaired Precaution/Restrictions Comments: R JP drain, RLE wound vac, MASD Required Braces or Orthoses: Splint/Cast Knee Immobilizer - Right: On at all times Splint/Cast: RUE Restrictions Weight  Bearing Restrictions Per Provider Order: Yes RUE Weight Bearing Per Provider Order: Partial weight bearing RUE Partial Weight Bearing Percentage or Pounds: okay to use walker, confirmed with MD/ortho, okay to weight bear right UE as long as R elbow remains extended RLE Weight Bearing Per Provider Order: Weight bearing as tolerated Other Position/Activity Restrictions: posterior hip precautions  Therapy/Group: Individual Therapy  [name] 12/11/2023, 7:58 AM

## 2023-12-11 NOTE — IPOC Note (Addendum)
 Overall Plan of Care [facility]) Patient Details Name: Sherri  KYNLEI [name] MRN: [id number] DOB: [birth year]  Admitting Diagnosis: History of revision of total replacement of right hip joint  Hospital Problems: Principal Problem:   History of revision of total replacement of right hip joint Active Problems:   Postoperative wound infection   Closed fracture of right elbow     Functional Problem List: Nursing Pain, Bladder, Safety, Endurance, Medication Management, Skin Integrity, Bowel  PT Balance, Edema, Endurance, Motor, Pain, Safety, Skin Integrity, Sensory  OT Motor, Endurance, Safety, Perception, Pain, Balance, Cognition  SLP Nutrition  TR         Basic ADL's: OT Grooming, Bathing, Dressing, Toileting     Advanced  ADL's: OT       Transfers: PT Bed Mobility, Bed to Chair, Car  OT Tub/Shower, Toilet     Locomotion: PT Ambulation, Wheelchair Mobility     Additional Impairments: OT Fuctional Use of Upper Extremity  SLP Swallowing      TR      Anticipated Outcomes Item Anticipated Outcome  Self Feeding mod I  Swallowing  minA   Basic self-care  CG to min A  Toileting  CG to min A   Bathroom Transfers CG to min A  Bowel/Bladder  manage bladder and bowel w mod I  Transfers  supervision/min A  Locomotion  supervision/min A  Communication     Cognition     Pain  Pain < 4 with prns  Safety/Judgment  manage safety w cues   Therapy Plan: PT Intensity: Minimum of 1-2 x/day ,45 to 90 minutes PT Frequency: 5 out of 7 days PT Duration Estimated Length of Stay: 12-14 days OT Intensity: Minimum of 1-2 x/day, 45 to 90 minutes OT Frequency: 5 out of 7 days OT Duration/Estimated Length of Stay: 12-14 days SLP Intensity: 1/2 x/day, 30-90 minutes  SLP Frequency: 1 to 3 out of 7 days SLP Duration/Estimated Length of Stay: 12-14 days   Team Interventions: Nursing Interventions Patient/Family Education, Medication Management, Bladder Management, Bowel Management,  Disease Management/Prevention, Pain Management, Discharge Planning, Skin Care/Wound Management  PT interventions Ambulation/gait training, DME/adaptive equipment instruction, Community reintegration, Neuromuscular re-education, Psychosocial support, Stair training, UE/LE Strength taining/ROM, Wheelchair propulsion/positioning, Warden/ranger, Discharge planning, Pain management, Skin care/wound management, Therapeutic Activities, UE/LE Coordination activities, Cognitive remediation/compensation, Disease management/prevention, Functional mobility training, Splinting/orthotics, Patient/family education, Therapeutic Exercise, Visual/perceptual remediation/compensation  OT Interventions Balance/vestibular training, Discharge planning, Pain management, Therapeutic Activities, UE/LE Coordination activities, Self Care/advanced ADL retraining, Skin care/wound managment, Patient/family education, Functional mobility training, Disease mangement/prevention, Cognitive remediation/compensation, Therapeutic Exercise, Wheelchair propulsion/positioning, UE/LE Strength taining/ROM, Psychosocial support, DME/adaptive equipment instruction, Community reintegration  SLP Interventions Patient/family education, Therapeutic Exercise, Dysphagia/aspiration precaution training  TR Interventions    SW/CM Interventions Discharge Planning, Psychosocial Support, Patient/Family Education   Barriers to Discharge MD  Medical stability and Wound care  Nursing Decreased caregiver support, Home environment access/layout 2 level ramped entry w Family, Available PRN/intermittently, Available 24 hours/day, Personal care attendant (Husband couldn't physically assist pt but could help with houshold management; Son leaves nearby; Daughter reports 24/7 aides  PT Home environment access/layout, Incontinence, Wound Care, Lack of/limited family support, Weight bearing restrictions wound vac, J tube, MASD, adherence to precautions,  cognitive deficits at baseline  OT      SLP      SW Insurance for SNF coverage, Decreased caregiver support, Weight bearing restrictions     Team Discharge Planning: Destination: PT-Home ,OT- Home , SLP-Home Projected Follow-up: PT-Home health PT, OT-  Home health OT, SLP-Home Health SLP Projected Equipment Needs: PT-To be determined, OT- 3 in 1 bedside comode, SLP-None recommended by SLP Equipment Details: PT-need to verify with family, OT-  Patient/family involved in discharge planning: PT- Patient, Patient unable/family or caregiver not available,  OT-Patient, SLP-Patient  MD ELOS: 12-14 Medical Rehab Prognosis:  Excellent Assessment: The patient has been admitted for CIR therapies with the diagnosis of 2nd hip dislocation s/p R total hip revision surgery WBAT on RLE. She also had R elbow hardware removal and olecranon excision and triceps advancement . The team will be addressing functional mobility, strength, stamina, balance, safety, adaptive techniques and equipment, self-care, bowel and bladder mgt, patient and caregiver education. Goals have been set at min A/ Sup. Anticipated discharge destination is home.        See Team Conference Notes for weekly updates to the plan of care

## 2023-12-11 NOTE — Progress Notes (Signed)
 Physical Therapy Session Note  Patient Details  Name: Sherri [name] MRN: [id number] Date of Birth: [birth year]  Today's Date: 12/11/2023 PT Individual Time: 0850-1000 PT Individual Time Calculation (min): 70 min   Short Term Goals: Week 1:  PT Short Term Goal 1 (Week 1): pt will perform sit to stand with LRAD and CGA PT Short Term Goal 2 (Week 1): pt will perform bed to chair transfer with RW and min A PT Short Term Goal 3 (Week 1): pt will ambuate 5 feet with LRAD and min A  Skilled Therapeutic Interventions/Progress Updates: Pt presents supine in bed and finishing w/ NT dressing.  Pt transferred sup to sit w/ mod A and then scoots to EOB. Pt performed sit to stand and then SPT w/ mod A and cues for hand placement.  Pt wheeled to outside for change in scenery.  Pt performed LE there ex including LAQ 3 x 10 (AAROM RLE), GS 3 x 10, calf raises 3 x 15.  Pt requires seated rest breaks 2/2 pain RLE.  Pt performed multiple sit to stand transfers w/ min A and even stepping forward and back x 3 steps.  Pt required extended seated rest breaks and encouragement to continue.  Pt returned to main gym and performed seated reaching forard to pull squigs from mirror w/in accepted THR precautions.  Pt returned to room and remained sitting in w/c w/ chair alarm on and all needs in reach.  Missed time of 5 2/2 nursing care.     Therapy Documentation Precautions:  Precautions Precautions: Fall, Posterior Hip, Other (comment) (PWB RUE- only for walking) Precaution Booklet Issued: No Recall of Precautions/Restrictions: Impaired Precaution/Restrictions Comments: R JP drain, RLE wound vac, MASD Required Braces or Orthoses: Splint/Cast Knee Immobilizer - Right: On at all times Splint/Cast: RUE Restrictions Weight Bearing Restrictions Per Provider Order: Yes RUE Weight Bearing Per Provider Order: Partial weight bearing RUE Partial Weight Bearing Percentage or Pounds: okay to use walker, confirmed with  MD/ortho, okay to weight bear right UE as long as R elbow remains extended RLE Weight Bearing Per Provider Order: Weight bearing as tolerated Other Position/Activity Restrictions: posterior hip precautions General: PT Amount of Missed Time (min): 5 Minutes PT Missed Treatment Reason: Nursing care Vital Signs:   Pain:6/10 Pain Assessment Pain Scale: 0-10 Pain Score: 0-No pain    Therapy/Group: Individual Therapy  Wister Hoefle P Thermon Zulauf 12/11/2023, 10:05 AM

## 2023-12-11 NOTE — Progress Notes (Signed)
 Patient ID: Sherri [name], female   DOB: 1941-03-26, 83 y.o.   MRN: [id number]  Made aware pt's step-grandson committed suicide yesterday. Met with pt to see if wanted a chaplain or to see neuro-psych while here. She reports she was fine and did not need a Orthoptist. She seemed detached and non-emotional regarding this. Will continue to offer support as needed while here.

## 2023-12-11 NOTE — Progress Notes (Addendum)
 13 Day Post-Op Procedure(s) (LRB): IRRIGATION AND DEBRIDEMENT RIGHT ELBOW  Subjective: Patient reports pain as mild about the right elbow, eating breakfast.  Splint of right extremity still holding up well.  Denies numbness or tingling of right arm.  Patient in CIR.   No overnight events, no other concerns today.  Right hip is being managed by Dr. Fidel.    Objective:   VITALS:   Vitals:   12/10/23 1329 12/10/23 1500 12/10/23 2120 12/11/23 0552  BP: 119/65  (!) 108/53 118/64  Pulse: 96  (!) 102 (!) 104  Resp: 17  18 19   Temp: (!) 97.5 F (36.4 C)  99.1 F (37.3 C) 98.3 F (36.8 C)  TempSrc: Oral     SpO2: 100%  98% 95%  Weight:  55.7 kg  56.1 kg    Resting comfortably in bed in NAD Sensation intact distally Intact pulses distally Compartment soft Splint clean, dry, and intact Wiggles fingers appropriately; AIN/PIN/IO appear grossly intact No significant swelling adjacent to splint (proximally or distally), and splint understandably limits visual examination of RUE.  Skin very thin, warm, and dry.  Visible skin of right arm ok.   Lab Results  Component Value Date   WBC 8.8 12/10/2023   HGB 9.5 (L) 12/10/2023   HCT 28.2 (L) 12/10/2023   MCV 95.6 12/10/2023   PLT 240 12/10/2023   BMET    Component Value Date/Time   NA 128 (L) 12/11/2023 0609   NA 139 06/20/2019 1335   NA 136 02/22/2015 0000   K 4.5 12/11/2023 0609   K 4.4 02/22/2015 0000   CL 96 (L) 12/11/2023 0609   CL 98 02/22/2015 0000   CO2 23 12/11/2023 0609   CO2 30 02/22/2015 0000   GLUCOSE 78 12/11/2023 0609   BUN 6 (L) 12/11/2023 0609   BUN 10 06/20/2019 1335   CREATININE 0.80 12/11/2023 0609   CREATININE 1.03 (H) 08/06/2023 1510   CALCIUM  7.6 (L) 12/11/2023 0609   CALCIUM  9.6 02/22/2015 0000   EGFR 55 (L) 08/06/2023 1510   GFRNONAA >60 12/11/2023 0609   GFRNONAA 56 (L) 09/26/2020 1536    Xray: Postop x-rays show interval removal of hardware with resection of olecranon fragment    Assessment/Plan:     Principal Problem:   History of revision of total replacement of right hip joint Active Problems:   Postoperative wound infection   Closed fracture of right elbow   Status post right elbow hardware removal with olecranon excision and triceps advancement 11/28/2023  12/01/23: Patient resting.  Looking to resume PT today.  Unsuccessful reduction by Dr. Fidel in OR yesterday see notes.  May resplint RUE for improved flexion prevention.  Discussed with Dr. Edna.  Likely SNF 12/02/23: Wound appears to be healing okay.  Splint reapplied with medial and lateral A-frame to reduce flexion capabilities.  No sling recommended but can use pillows to prop arm up for support/reduced strain.  RUE still grossly neurovascularly intact post splint application.  Can keep this on until follow up with Dr. Edna.  Still no weight bearing of RUE, plan to reassess at follow up 10-14 days post-op. 7/12: Resting.  Splint situated okay, no significant adjacent swelling.Possible CIR, pending eval. 7/18: Splint has been holding up ok.  Examination ok.  Plan for OT to make a removable splint for right arm, can allow better access for wound care as needed.  Can do some light weight bearing on arm for walker use.  Likely suture removal  Monday.    Post op recs: WB: Immobilized in a long-arm splint, okay to use a walker for weightbearing with the right upper extremity Abx: ancef  periop and ceftriaxone  post op then dc on keflex  Imaging: PACU xrays DVT prophylaxis: Aspirin  81mg  daily for 4 weeks Follow up: 10-14 days after surgery for a wound check with Dr. Edna at [facility].  Address: [address], [place], [place] [address]  Office Phone: [phone]   Bernarda CHRISTELLA Mclean 12/11/2023, 7:51 AM     Contact information:   Weekdays 7am-5pm epic message Dr. Edna, or call office for patient follow up: [phone] After hours and holidays please check  [email or web address] for group call information for [facility]

## 2023-12-12 DIAGNOSIS — Z96641 Presence of right artificial hip joint: Secondary | ICD-10-CM | POA: Diagnosis not present

## 2023-12-12 DIAGNOSIS — K5901 Slow transit constipation: Secondary | ICD-10-CM | POA: Diagnosis not present

## 2023-12-12 DIAGNOSIS — I10 Essential (primary) hypertension: Secondary | ICD-10-CM | POA: Diagnosis not present

## 2023-12-12 LAB — BASIC METABOLIC PANEL WITH GFR
Anion gap: 13 (ref 5–15)
BUN: 5 mg/dL — ABNORMAL LOW (ref 8–23)
CO2: 22 mmol/L (ref 22–32)
Calcium: 7.7 mg/dL — ABNORMAL LOW (ref 8.9–10.3)
Chloride: 96 mmol/L — ABNORMAL LOW (ref 98–111)
Creatinine, Ser: 0.57 mg/dL (ref 0.44–1.00)
GFR, Estimated: >60 mL/min (ref >60)
Glucose, Bld: 83 mg/dL (ref 70–99)
Potassium: 3.7 mmol/L (ref 3.5–5.1)
Sodium: 131 mmol/L — ABNORMAL LOW (ref 135–145)

## 2023-12-12 MED ORDER — MAGIC MOUTHWASH W/LIDOCAINE: 5.0000 mL | Freq: Three times a day (TID) | ORAL | Status: DC | PRN

## 2023-12-12 NOTE — Progress Notes (Signed)
 Occupational Therapy Session Note  Patient Details  Name: Zadie  KENNESHIA REHM MRN: [id number] Date of Birth: 19-Jan-1941  Today's Date: 12/12/2023 OT Individual Time: 0910-1010 OT Individual Time Calculation (min): 60 min    Short Term Goals: Week 1:  OT Short Term Goal 1 (Week 1): patient will demonstrate increased static standing balance to complete LB ADLs OT Short Term Goal 2 (Week 1): patient will demo increase in use of RUE as stabilizer to increase ADL participation OT Short Term Goal 3 (Week 1): patient will demo ability to don pants mod A with AE.  Skilled Therapeutic Interventions/Progress Updates:    Patient resting in bed at the time of arrival with Nursing  present passing morning meds. Patient reported a pain response of 6 for her R hip and arm.  The pt indicated that she needed the bed pan and was able to roll from R to L for insertion of the bed pan at  [facility]. The patient was able to complete pericare at bed LOF with MinA and MaxA for donning her brief.  The pt  was able to come from supine in bed to EOB with ModA, The pt was able to transfer from EOB  to w/c LOF with ModA using the arm of the w/c.  The pt was  transported to the sink area and was able to come from sit to stand using the sink for donning her pants at MaxA for bring them over her  hips and bottom. The pt was able to brush her hair and teeth with s/uA. The pt had a skin tear  that opened up on the R leg above the knee the PA and Nursing were made aware. Nursing came in and was able to reapply a bandage.  At the end of the session, the pt remained at w/c  LOF  with her feet supported with the call light and bedside table within reach and all additional needs addressed prior to exiting the room.    Therapy Documentation Precautions:  Precautions Precautions: Fall, Posterior Hip, Other (comment) (PWB RUE- only for walking) Precaution Booklet Issued: No Recall of Precautions/Restrictions: Impaired Precaution/Restrictions  Comments: R JP drain, RLE wound vac, MASD Required Braces or Orthoses: Splint/Cast Knee Immobilizer - Right: On at all times Splint/Cast: RUE Restrictions Weight Bearing Restrictions Per Provider Order: Yes RUE Weight Bearing Per Provider Order: Partial weight bearing RUE Partial Weight Bearing Percentage or Pounds: okay to use walker, confirmed with MD/ortho, okay to weight bear right UE as long as R elbow remains extended RLE Weight Bearing Per Provider Order: Weight bearing as tolerated Other Position/Activity Restrictions: posterior hip precautions  Therapy/Group: Individual Therapy  Elvera JONETTA Mace 12/12/2023, 12:32 PM

## 2023-12-12 NOTE — Progress Notes (Signed)
 PROGRESS NOTE   Subjective/Complaints:  Pt doing well, slept ok, pain well managed, LBM this morning, urinating fine. No other complaints or concerns. Got a skin tear with OT this morning, being dressed.   ROS: as per HPI. Denies CP, SOB, abd pain, N/V/D/C, or any other complaints at this time.   + chronic joint pain +nausea, vomiting- improved   Objective:   No results found. Recent Labs    12/10/23 0436  WBC 8.8  HGB 9.5*  HCT 28.2*  PLT 240   Recent Labs    12/11/23 0609 12/12/23 0707  NA 128* 131*  K 4.5 3.7  CL 96* 96*  CO2 23 22  GLUCOSE 78 83  BUN 6* 5*  CREATININE 0.80 0.57  CALCIUM  7.6* 7.7*    Intake/Output Summary (Last 24 hours) at 12/12/2023 1215 Last data filed at 12/12/2023 0857 Gross per 24 hour  Intake 1066.41 ml  Output --  Net 1066.41 ml        Physical Exam: Vital Signs Blood pressure 113/89, pulse (!) 101, temperature 98.1 F (36.7 C), temperature source Oral, resp. rate 18, height 5' (1.524 m), weight 56.1 kg, SpO2 95%.  General: appears comfortable sitting up in w/c with OT HEENT: Head is normocephalic, atraumatic, sclera anicteric, oral mucosa a little dry Neck: Supple without JVD or lymphadenopathy Heart: Reg rate and rhythm. No murmurs rubs or gallops Chest: CTA bilaterally without wheezes, rales, or rhonchi; no distress Abdomen: Soft, non-tender, non-distended, bowel sounds positive. Extremities: + trace LE edema Psych: Pt's affect is appropriate. Pt is cooperative pleasant  Skin: skin tear to R distal thigh, being covered by OT. Thin skin, multiple skin tears and covered areas, scattered bruising.   PRIOR EXAMS: Skin: Has a rash type bruising on L side of face consistent with the intubation for last surgery; skin appears pale, ecchymosis noted on chest wall and all 4 extremities   Severely bruised/purple R labia Significant MASD and raw buttocks and genitals/perineum-  not checked today Neuro:   AAOx3, sensation intact in all 4 extremities to LT, hard of hearing  Prior neuro assessment is c/w today's exam 12/12/2023.   MSK:  Arthritic changes noted in her hands RUE with splint and ace wrap R leg has drain with small amount sanguinous drainage in bulb and VAC also has small amount drainage in it RUE- 4-4+/5 R deltoids and can wiggle fingers LUE 4+/5 throughout RLE- HF 2/5;  hard to bend knee, but at least 3/5;DF/PF 4/5 LLE- 5-/5 throughout     Assessment/Plan: 1. Functional deficits which require 3+ hours per day of interdisciplinary therapy in a comprehensive inpatient rehab setting. Physiatrist is providing close team supervision and 24 hour management of active medical problems listed below. Physiatrist and rehab team continue to assess barriers to discharge/monitor patient progress toward functional and medical goals  Care Tool:  Bathing    Body parts bathed by patient: Right arm, Left upper leg, Left arm, Chest, Abdomen, Front perineal area, Right upper leg, Face   Body parts bathed by helper: Right lower leg, Left lower leg, Buttocks, Front perineal area     Bathing assist Assist Level: Moderate Assistance - Patient 50 -  74%     Upper Body Dressing/Undressing Upper body dressing   What is the patient wearing?: Pull over shirt    Upper body assist Assist Level: Set up assist    Lower Body Dressing/Undressing Lower body dressing      What is the patient wearing?: Pants, Incontinence brief     Lower body assist Assist for lower body dressing: Maximal Assistance - Patient 25 - 49%     Toileting Toileting Toileting Activity did not occur (Clothing management and hygiene only): N/A (no void or bm)  Toileting assist Assist for toileting: Maximal Assistance - Patient 25 - 49%     Transfers Chair/bed transfer  Transfers assist     Chair/bed transfer assist level: Moderate Assistance - Patient 50 - 74%      Locomotion Ambulation   Ambulation assist   Ambulation activity did not occur: Safety/medical concerns          Walk 10 feet activity   Assist  Walk 10 feet activity did not occur: Safety/medical concerns        Walk 50 feet activity   Assist Walk 50 feet with 2 turns activity did not occur: Safety/medical concerns         Walk 150 feet activity   Assist Walk 150 feet activity did not occur: Safety/medical concerns         Walk 10 feet on uneven surface  activity   Assist Walk 10 feet on uneven surfaces activity did not occur: Safety/medical concerns         Wheelchair     Assist Is the patient using a wheelchair?: Yes Type of Wheelchair: Manual    Wheelchair assist level: Dependent - Patient 0%      Wheelchair 50 feet with 2 turns activity    Assist        Assist Level: Dependent - Patient 0%   Wheelchair 150 feet activity     Assist      Assist Level: Dependent - Patient 0%   Blood pressure 113/89, pulse (!) 101, temperature 98.1 F (36.7 C), temperature source Oral, resp. rate 18, height 5' (1.524 m), weight 56.1 kg, SpO2 95%.  Medical Problem List and Plan: 1. 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 She also had R elbow hardware removal and olecranon excision and triceps advancement 11/28/23.             -patient may not  shower due to VAC             -ELOS/Goals: 12-14 days- Superviison to min A WBAT with walker RLE-posterior hip precautions Partial weightbearing RUE okay to use walker per Dr. Edna- s/p R elbow hardware removal - Message ortho to confirm wt bearing use with walker- OK for weightbearing but no bending elbow-discussed with Dr. Edna, appreciate assistance.  Okay to remove splint when thermoplastic splint is ready.  Okay to remove this for wound care and hygiene otherwise needs to stay on.  -Continue CIR - Patient was seen by Kemp Hila Cockerham-possible suture removal Monday, plan for right upper extremity splint to be made today 12/11/2023  2.  Antithrombotics: -DVT/anticoagulation:  Mechanical:  Antiembolism stockings, knee (TED hose) Bilateral lower extremities Sequential compression devices, below knee Bilateral lower extremities             -antiplatelet therapy: Aspirin  81 mg held due to bleeding risk  3. Pain Management: Oxycodone  and Tylenol  as needed             -  Continue Cymbalta  20 mg daily and Flexeril  10 mg at bedtime 4. Mood/Behavior/Sleep: ego support             -antipsychotic agents: N/A 5. Neuropsych/cognition: This patient is intermittently capable of making decisions on her own behalf.  6. Skin/Wound Care: Routine pressure relief measures             -JP drain-per Ortho will discontinue when output < 10 cc per shift             -Continue negative pressure at 75 mmHg--can be exchanged to Prevena  - MASD-7/15 start Diflucan  200 mg then 100 mg x 6 days -Gerhardt's  -Diffuse ecchymoses- Platelets 330 CTM Routine labs, follow-up with derm/hematology outpatient.  7. Fluids/Electrolytes/Nutrition: Monitor I's and O's, routine labs             -Diet Heart Healthy-- SLP recc nectar thick- pt not agreeable  -Ensure High protein + Zinc  and vitamin C  -Overactive Bladder- cont Oxybutynin  5mg  nightly  8. Hypertension/ intermittent mild tachycardia: Continue Coreg  3.125 mg BID,              -7/18-19 BP controlled, continue to monitor,   Vitals:   12/08/23 1700 12/08/23 1918 12/09/23 0450 12/09/23 1247  BP: 128/69 (!) 151/80 (!) 157/81 138/70   12/09/23 2003 12/10/23 0455 12/10/23 1329 12/10/23 2120  BP: 133/75 (!) 150/73 119/65 (!) 108/53   12/11/23 0552 12/11/23 1508 12/11/23 1915 12/12/23 0509  BP: 118/64 128/73 125/60 113/89      9. Rheumatoid arthritis-continue home prednisone  5 mg daily and methotrexate  12.5mg  weekly- methotrexate  just restarted.   10. Microcytic anemia/acute blood loss  anemia: monitor for signs of bleeding recheck CBC in a.m   -normal folate & B12, iron study suggestive of AOC, status post 3 units PRBC. -7/17 Stable HGB at 9.5  11. Severe MASD: will try Diflucan  200 mg x1 and 100 mg daily x 6 days and monitor skin  12. Constipation- senna 8.6mg  BID  - 7/16 reports improvement after multiple bowel movements yesterday  -7/17 LMB 7/15, continue to monitor  -7/18 miralrax daily, PRN sorbitol   -12/12/23 LBM this morning, monitor  13. Dysphagia, chronic  -Prior SLP eval re nectar thick, she has been refusing modification/nectar thick liquids  14. Hypokalemia/Hyponateria  -7/17 potassium x2 today, recheck tomorrow 7/18 K+ up to 4.5, NA lower at 128  -Check urine sodium/osmolality due to hyponatremia, asymptomatic,  Cr higher then her baseline at 0.8 today, HGB higher on 7/15 and 7/17 think she is a little fluid depleted, add NS 50ml/hr and monitor response  -Cr 0.57, Na 131, K 3.7. will stop fluids today and recheck Monday. Urine studies not done yet.   15. Nausea  -compazine  PRN -7/19 improved today, discussed with pharmacy to try to adjust medication timing.  Nursing order to try to do medications after breakfast   16. HLD: continue atorvastatin  10mg  nightly.   Addendum: social work note indicates step-grandson committed suicide yesterday   LOS: 4 days A FACE TO FACE EVALUATION WAS PERFORMED  [address] 12/12/2023, 12:15 PM

## 2023-12-12 NOTE — Progress Notes (Signed)
 Occupational Therapy Session Note  Patient Details  Name: Sherri  RASHELLE [name] MRN: [id number] Date of Birth: [birth year]  Session 1: Today's Date: 12/12/2023 OT Individual Time: 8881-8798 OT Individual Time Calculation (min): 43 min   Session 2: Today's Date: 12/12/2023 OT Individual Time: 8653-8564 OT Individual Time Calculation (min): 49 min   Short Term Goals: Week 1:  OT Short Term Goal 1 (Week 1): patient will demonstrate increased static standing balance to complete LB ADLs OT Short Term Goal 2 (Week 1): patient will demo increase in use of RUE as stabilizer to increase ADL participation OT Short Term Goal 3 (Week 1): patient will demo ability to don pants mod A with AE.  Session 1: Skilled Therapeutic Interventions/Progress Updates:   Patient agreeable to participate in OT session. Reports 8/10 pain level, reported to RN medication given.   Patient reported max pain and drowsiness today. Patient participated in skilled OT session focusing on functional mobility/ bed mobility within precautions. Patient able to complete sit to stand with therapist mod A. Max A to total A to transfer to bsc. No voiding. Patient completed  rolling within precautions for increased mobility. Ot placed alarm on, all needs in reach. . Patient demonstrated increased non compliance with precautions for hip.   Session 2: Skilled Therapeutic Interventions/Progress Updates:   Patient agreeable to participate in OT session. Reports no pain level.   Patient received by OT sitting on EOB finishing lunch. Patient appeared and reported being much more awake, alert, and in less pain than previous session. Patient completed transfer with OT mod A to max A with verbal cueing. Patient and OT completed UB bathing with splint off to increase patients ability to complete ADLs. Patient required mod verbal cues for precaution following. Patient also Patient participated in skilled OT session focusing on splint checking for  bony prominences. Patient splint adjusted and patient reported increased comfort while maintaining support. Therapist facilitated increased weight shifting in order to improve functional transfers.      Therapy Documentation Precautions:  Precautions Precautions: Fall, Posterior Hip, Other (comment) (PWB RUE- only for walking) Precaution Booklet Issued: No Recall of Precautions/Restrictions: Impaired Precaution/Restrictions Comments: R JP drain, RLE wound vac, MASD Required Braces or Orthoses: Splint/Cast Knee Immobilizer - Right: On at all times Splint/Cast: RUE Restrictions Weight Bearing Restrictions Per Provider Order: Yes RUE Weight Bearing Per Provider Order: Partial weight bearing RUE Partial Weight Bearing Percentage or Pounds: okay to use walker, confirmed with MD/ortho, okay to weight bear right UE as long as R elbow remains extended RLE Weight Bearing Per Provider Order: Weight bearing as tolerated Other Position/Activity Restrictions: posterior hip precautions  Therapy/Group: Individual Therapy  D'mariea L Torez Beauregard 12/12/2023, 7:52 AM

## 2023-12-12 NOTE — Progress Notes (Signed)
 Physical Therapy Session Note  Patient Details  Name: Sherri  LORISA [name] MRN: [id number] Date of Birth: Jan 25, 1941  Today's Date: 12/12/2023 PT Individual Time: 0730-0815 PT Individual Time Calculation (min): 45 min   Short Term Goals: Week 1:  PT Short Term Goal 1 (Week 1): pt will perform sit to stand with LRAD and CGA PT Short Term Goal 2 (Week 1): pt will perform bed to chair transfer with RW and min A PT Short Term Goal 3 (Week 1): pt will ambuate 5 feet with LRAD and min A  Skilled Therapeutic Interventions/Progress Updates:    Chart reviewed and pt agreeable to therapy. Pt received semi-reclined in bed with 5/10 c/o pain in hip. Also of note, pt c/o constipation. Session focused on bed mobility and functional transfers to promote independent home mobility. Pt initiated session with transfer to EOB using elevated HOB per home set up + minA + VC for sequencing to protect RLE. Pt then completed multiple sit to stand practices using modA + RW to come to partial stand and progressing to minA + RW to come to full stand. Pt then reported urge for BM. Pt unable to take steps at this time, so pt returned to bed with modA and placed on bedpan. NT and RN informed of pt status on bedpan. Session education emphasized posterior hip precautions, which pt was able to verbalize. At end of session, pt was left semi reclined in bed on bedpan with alarm engaged, nurse call bell and all needs in reach.     Therapy Documentation Precautions:  Precautions Precautions: Fall, Posterior Hip, Other (comment) (PWB RUE- only for walking) Precaution Booklet Issued: No Recall of Precautions/Restrictions: Impaired Precaution/Restrictions Comments: R JP drain, RLE wound vac, MASD Required Braces or Orthoses: Splint/Cast Knee Immobilizer - Right: On at all times Splint/Cast: RUE Restrictions Weight Bearing Restrictions Per Provider Order: Yes RUE Weight Bearing Per Provider Order: Partial weight bearing RUE  Partial Weight Bearing Percentage or Pounds: okay to use walker, confirmed with MD/ortho, okay to weight bear right UE as long as R elbow remains extended RLE Weight Bearing Per Provider Order: Weight bearing as tolerated Other Position/Activity Restrictions: posterior hip precautions   Therapy/Group: Individual Therapy  [name] 12/12/2023, 9:08 AM

## 2023-12-13 DIAGNOSIS — R339 Retention of urine, unspecified: Secondary | ICD-10-CM | POA: Diagnosis not present

## 2023-12-13 DIAGNOSIS — Z96641 Presence of right artificial hip joint: Secondary | ICD-10-CM | POA: Diagnosis not present

## 2023-12-13 DIAGNOSIS — K5901 Slow transit constipation: Secondary | ICD-10-CM | POA: Diagnosis not present

## 2023-12-13 DIAGNOSIS — I10 Essential (primary) hypertension: Secondary | ICD-10-CM | POA: Diagnosis not present

## 2023-12-13 MED ORDER — SODIUM CHLORIDE 0.9% FLUSH
10.0000 mL | Freq: Two times a day (BID) | INTRAVENOUS | Status: DC
  Administered 2023-12-13 – 2023-12-20 (×10): 10 mL via INTRAVENOUS

## 2023-12-13 NOTE — Progress Notes (Signed)
 PROGRESS NOTE   Subjective/Complaints:  Pt doing ok but having a rough morning d/t needing to be cathed. States that she's had trouble urinating for a while, since her surgery in Dec-- but appears she was on oxybutinin so had OAB? Unclear. Unclear if she had retention issues before.  Slept ok, pain well managed, LBM yesterday. No other complaints or concerns.   ROS: as per HPI. Denies CP, SOB, abd pain, N/V/D/C, or any other complaints at this time.   + chronic joint pain +nausea, vomiting- improved   Objective:   No results found. No results for input(s): WBC, HGB, HCT, PLT in the last 72 hours.  Recent Labs    12/11/23 0609 12/12/23 0707  NA 128* 131*  K 4.5 3.7  CL 96* 96*  CO2 23 22  GLUCOSE 78 83  BUN 6* 5*  CREATININE 0.80 0.57  CALCIUM  7.6* 7.7*    Intake/Output Summary (Last 24 hours) at 12/13/2023 1218 Last data filed at 12/13/2023 1000 Gross per 24 hour  Intake 460 ml  Output 1625 ml  Net -1165 ml        Physical Exam: Vital Signs Blood pressure 133/66, pulse 85, temperature 97.7 F (36.5 C), temperature source Oral, resp. rate 18, height 5' (1.524 m), weight 56.1 kg, SpO2 97%.  General: appears comfortable resting in bed HEENT: Head is normocephalic, atraumatic, sclera anicteric, oral mucosa a little dry Neck: Supple without JVD or lymphadenopathy Heart: Reg rate and rhythm. No murmurs rubs or gallops Chest: CTA bilaterally without wheezes, rales, or rhonchi; no distress Abdomen: Soft, non-tender, non-distended but bladder feels full, bowel sounds positive. Extremities: + trace LE edema Psych: Pt's affect is appropriate. Pt is cooperative pleasant  Skin: skin tear to R distal thigh, covered with gauze. Thin skin, multiple skin tears and covered areas, scattered bruising.   PRIOR EXAMS: Skin: Has a rash type bruising on L side of face consistent with the intubation for last surgery; skin  appears pale, ecchymosis noted on chest wall and all 4 extremities   Severely bruised/purple R labia Significant MASD and raw buttocks and genitals/perineum- not checked today Neuro:   AAOx3, sensation intact in all 4 extremities to LT, hard of hearing  Prior neuro assessment is c/w today's exam 12/13/2023.   MSK:  Arthritic changes noted in her hands RUE with splint and ace wrap R leg has drain with small amount sanguinous drainage in bulb and VAC also has small amount drainage in it RUE- 4-4+/5 R deltoids and can wiggle fingers LUE 4+/5 throughout RLE- HF 2/5;  hard to bend knee, but at least 3/5;DF/PF 4/5 LLE- 5-/5 throughout     Assessment/Plan: 1. Functional deficits which require 3+ hours per day of interdisciplinary therapy in a comprehensive inpatient rehab setting. Physiatrist is providing close team supervision and 24 hour management of active medical problems listed below. Physiatrist and rehab team continue to assess barriers to discharge/monitor patient progress toward functional and medical goals  Care Tool:  Bathing    Body parts bathed by patient: Right arm, Left upper leg, Left arm, Chest, Abdomen, Front perineal area, Right upper leg, Face   Body parts bathed by helper: Right  lower leg, Left lower leg, Buttocks, Front perineal area     Bathing assist Assist Level: Moderate Assistance - Patient 50 - 74%     Upper Body Dressing/Undressing Upper body dressing   What is the patient wearing?: Pull over shirt    Upper body assist Assist Level: Set up assist    Lower Body Dressing/Undressing Lower body dressing      What is the patient wearing?: Pants, Incontinence brief     Lower body assist Assist for lower body dressing: Maximal Assistance - Patient 25 - 49%     Toileting Toileting Toileting Activity did not occur (Clothing management and hygiene only): N/A (no void or bm)  Toileting assist Assist for toileting: Maximal Assistance - Patient 25 -  49%     Transfers Chair/bed transfer  Transfers assist     Chair/bed transfer assist level: Moderate Assistance - Patient 50 - 74%     Locomotion Ambulation   Ambulation assist   Ambulation activity did not occur: Safety/medical concerns          Walk 10 feet activity   Assist  Walk 10 feet activity did not occur: Safety/medical concerns        Walk 50 feet activity   Assist Walk 50 feet with 2 turns activity did not occur: Safety/medical concerns         Walk 150 feet activity   Assist Walk 150 feet activity did not occur: Safety/medical concerns         Walk 10 feet on uneven surface  activity   Assist Walk 10 feet on uneven surfaces activity did not occur: Safety/medical concerns         Wheelchair     Assist Is the patient using a wheelchair?: Yes Type of Wheelchair: Manual    Wheelchair assist level: Dependent - Patient 0%      Wheelchair 50 feet with 2 turns activity    Assist        Assist Level: Dependent - Patient 0%   Wheelchair 150 feet activity     Assist      Assist Level: Dependent - Patient 0%   Blood pressure 133/66, pulse 85, temperature 97.7 F (36.5 C), temperature source Oral, resp. rate 18, height 5' (1.524 m), weight 56.1 kg, SpO2 97%.  Medical Problem List and Plan: 1. 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 She also had R elbow hardware removal and olecranon excision and triceps advancement 11/28/23.             -patient may not  shower due to VAC             -ELOS/Goals: 12-14 days- Superviison to min A WBAT with walker RLE-posterior hip precautions Partial weightbearing RUE okay to use walker per Dr. Edna- s/p R elbow hardware removal - Message ortho to confirm wt bearing use with walker- OK for weightbearing but no bending elbow-discussed with Dr. Edna, appreciate assistance.  Okay to remove splint when thermoplastic  splint is ready.  Okay to remove this for wound care and hygiene otherwise needs to stay on.  -Continue CIR - Patient was seen by Kemp Hila Cockerham-possible suture removal Monday, plan for right upper extremity splint to be made today 12/11/2023 -Addendum: social work note indicates step-grandson committed suicide yesterday  2.  Antithrombotics: -DVT/anticoagulation:  Mechanical:  Antiembolism stockings, knee (TED hose) Bilateral lower extremities Sequential compression devices, below knee Bilateral lower extremities             -  antiplatelet therapy: Aspirin  81 mg held due to bleeding risk  3. Pain Management: Oxycodone  and Tylenol  as needed             - Continue Cymbalta  20 mg daily and Flexeril  10 mg at bedtime 4. Mood/Behavior/Sleep: ego support             -antipsychotic agents: N/A 5. Neuropsych/cognition: This patient is intermittently capable of making decisions on her own behalf.  6. Skin/Wound Care: Routine pressure relief measures             -JP drain-per Ortho will discontinue when output < 10 cc per shift             -Continue negative pressure at 75 mmHg--can be exchanged to Prevena  - MASD-7/15 start Diflucan  200 mg then 100 mg x 6 days -Gerhardt's  -Diffuse ecchymoses- Platelets 330 CTM Routine labs, follow-up with derm/hematology outpatient.  7. Fluids/Electrolytes/Nutrition: Monitor I's and O's, routine labs             -Diet Heart Healthy-- SLP recc nectar thick- pt not agreeable  -Ensure High protein + Zinc  and vitamin C    8. Hypertension/ intermittent mild tachycardia: Continue Coreg  3.125 mg BID,              -7/18-20 BP controlled, continue to monitor   Vitals:   12/10/23 1329 12/10/23 2120 12/11/23 0552 12/11/23 1508  BP: 119/65 (!) 108/53 118/64 128/73   12/11/23 1915 12/12/23 0509 12/12/23 1459 12/12/23 1831  BP: 125/60 113/89 130/69 127/71   12/12/23 1916 12/13/23 0338 12/13/23 0344 12/13/23 0910  BP: (!) 111/44 (!) 143/127 130/63 133/66      9.  Rheumatoid arthritis-continue home prednisone  5 mg daily and methotrexate  12.5mg  weekly- methotrexate  just restarted.   10. Microcytic anemia/acute blood loss anemia: monitor for signs of bleeding recheck CBC in a.m   -normal folate & B12, iron study suggestive of AOC, status post 3 units PRBC. -7/17 Stable HGB at 9.5  11. Severe MASD: will try Diflucan  200 mg x1 and 100 mg daily x 6 days and monitor skin  12. Constipation- senna 8.6mg  BID  - 7/16 reports improvement after multiple bowel movements yesterday  -7/17 LMB 7/15, continue to monitor  -7/18 miralrax daily, PRN sorbitol   -12/12/23 LBM this morning, monitor  13. Dysphagia, chronic  -Prior SLP eval re nectar thick, she has been refusing modification/nectar thick liquids  14. Hypokalemia/Hyponateria  -7/17 potassium x2 today, recheck tomorrow 7/18 K+ up to 4.5, NA lower at 128  -Check urine sodium/osmolality due to hyponatremia, asymptomatic,  Cr higher then her baseline at 0.8 today, HGB higher on 7/15 and 7/17 think she is a little fluid depleted, add NS 50ml/hr and monitor response  -Cr 0.57, Na 131, K 3.7. will stop fluids today and recheck Monday. Urine studies not done yet.   15. Nausea  -compazine  PRN -7/19 improved today, discussed with pharmacy to try to adjust medication timing.  Nursing order to try to do medications after breakfast   16. HLD: continue atorvastatin  10mg  nightly.   17. Overactive Bladder- cont Oxybutynin  5mg  nightly -12/13/23 pt now having urinary retention requiring caths. Will stop Oxybutinin, monitor off it for now, if still having retention then would get U/A and consider adding urecholine  I spent >46mins performing patient care related activities, including face to face time, documentation time, med review and med management, discussion of meds with patient and staff, and overall coordination of care.   LOS: 5 days  A FACE TO FACE EVALUATION WAS PERFORMED  [address] 12/13/2023,  12:18 PM

## 2023-12-13 NOTE — Progress Notes (Signed)
 Physical Therapy Session Note  Patient Details  Name: Sherri [name] MRN: [id number] Date of Birth: [birth year]  Today's Date: 12/13/2023 PT Individual Time: 1400-1446 PT Individual Time Calculation (min): 46 min   Short Term Goals: Week 1:  PT Short Term Goal 1 (Week 1): pt will perform sit to stand with LRAD and CGA PT Short Term Goal 2 (Week 1): pt will perform bed to chair transfer with RW and min A PT Short Term Goal 3 (Week 1): pt will ambuate 5 feet with LRAD and min A  Skilled Therapeutic Interventions/Progress Updates:      Pt supine in bed upon arrival. Pt agreeable to therapy. Pt reports R LE hip pain, premedicated. Therapist provided rest breaks and repositioning as needed.   Education provided for positioning in bed for maintenance of posterior hip precations with emphasis on lowering the leg portion of the bed.   Pt R LE dressing unraveled in bed. Notified nurse. Nurse present to change dressing. Pt assisted with R LE positioning for maintenance of posterior hip precautions.   Supine to sit with min A with use of bed rails, verbal cues provided for technique.   Sit to stand with CGA/min A and RW throughout session from elevated bed and standard WC. Pt performed stand pivot transfer with min A with RW.   Pt ambulated 5 feet with RW and CGA with WC in tow. Pt required seated rest break 2/2 fatigue.   Pt seated in [facility] with tech assisting pt at end of session.       Therapy Documentation Precautions:  Precautions Precautions: Fall, Posterior Hip, Other (comment) (PWB RUE- only for walking) Precaution Booklet Issued: No Recall of Precautions/Restrictions: Impaired Precaution/Restrictions Comments: R JP drain, RLE wound vac, MASD Required Braces or Orthoses: Splint/Cast Knee Immobilizer - Right: On at all times Splint/Cast: RUE Restrictions Weight Bearing Restrictions Per Provider Order: Yes RUE Weight Bearing Per Provider Order: Partial weight bearing RUE  Partial Weight Bearing Percentage or Pounds: okay to use walker, confirmed with MD/ortho, okay to weight bear right UE as long as R elbow remains extended RLE Weight Bearing Per Provider Order: Weight bearing as tolerated Other Position/Activity Restrictions: posterior hip precautions  Therapy/Group: Individual Therapy  [facility] [place], [place], DPT  12/13/2023, 2:22 PM

## 2023-12-14 ENCOUNTER — Inpatient Hospital Stay (HOSPITAL_COMMUNITY)

## 2023-12-14 DIAGNOSIS — Z96641 Presence of right artificial hip joint: Secondary | ICD-10-CM | POA: Diagnosis not present

## 2023-12-14 DIAGNOSIS — E871 Hypo-osmolality and hyponatremia: Secondary | ICD-10-CM | POA: Diagnosis not present

## 2023-12-14 DIAGNOSIS — I10 Essential (primary) hypertension: Secondary | ICD-10-CM | POA: Diagnosis not present

## 2023-12-14 DIAGNOSIS — K5900 Constipation, unspecified: Secondary | ICD-10-CM | POA: Diagnosis not present

## 2023-12-14 LAB — CBC WITH DIFFERENTIAL/PLATELET
Abs Immature Granulocytes: 0.11 K/uL — ABNORMAL HIGH (ref 0.00–0.07)
Basophils Absolute: 0 K/uL (ref 0.0–0.1)
Basophils Relative: 0 %
Eosinophils Absolute: 0.1 K/uL (ref 0.0–0.5)
Eosinophils Relative: 1 %
HCT: 35 % — ABNORMAL LOW (ref 36.0–46.0)
Hemoglobin: 11.4 g/dL — ABNORMAL LOW (ref 12.0–15.0)
Immature Granulocytes: 1 %
Lymphocytes Relative: 13 %
Lymphs Abs: 1 K/uL (ref 0.7–4.0)
MCH: 32.2 pg (ref 26.0–34.0)
MCHC: 32.6 g/dL (ref 30.0–36.0)
MCV: 98.9 fL (ref 80.0–100.0)
Monocytes Absolute: 0.7 K/uL (ref 0.1–1.0)
Monocytes Relative: 9 %
Neutro Abs: 5.7 K/uL (ref 1.7–7.7)
Neutrophils Relative %: 76 %
Platelets: 176 K/uL (ref 150–400)
RBC: 3.54 MIL/uL — ABNORMAL LOW (ref 3.87–5.11)
RDW: 18.8 % — ABNORMAL HIGH (ref 11.5–15.5)
WBC: 7.6 K/uL (ref 4.0–10.5)
nRBC: 0 % (ref 0.0–0.2)

## 2023-12-14 LAB — BASIC METABOLIC PANEL WITH GFR
Anion gap: 14 (ref 5–15)
BUN: <5 mg/dL — ABNORMAL LOW (ref 8–23)
CO2: 19 mmol/L — ABNORMAL LOW (ref 22–32)
Calcium: 7.8 mg/dL — ABNORMAL LOW (ref 8.9–10.3)
Chloride: 99 mmol/L (ref 98–111)
Creatinine, Ser: 0.49 mg/dL (ref 0.44–1.00)
GFR, Estimated: >60 mL/min (ref >60)
Glucose, Bld: 75 mg/dL (ref 70–99)
Potassium: 3.6 mmol/L (ref 3.5–5.1)
Sodium: 132 mmol/L — ABNORMAL LOW (ref 135–145)

## 2023-12-14 LAB — URINALYSIS, W/ REFLEX TO CULTURE (INFECTION SUSPECTED)
Bacteria, UA: NONE SEEN
Bilirubin Urine: NEGATIVE
Glucose, UA: NEGATIVE mg/dL
Hgb urine dipstick: NEGATIVE
Ketones, ur: NEGATIVE mg/dL
Nitrite: NEGATIVE
Protein, ur: NEGATIVE mg/dL
Specific Gravity, Urine: 1.01 (ref 1.005–1.030)
pH: 7 (ref 5.0–8.0)

## 2023-12-14 MED ORDER — TAMSULOSIN HCL 0.4 MG PO CAPS
0.4000 mg | ORAL_CAPSULE | Freq: Every day | ORAL | Status: DC
  Administered 2023-12-14 – 2023-12-22 (×9): 0.4 mg via ORAL
  Filled 2023-12-14 (×9): qty 1

## 2023-12-14 MED ORDER — SODIUM CHLORIDE 1 G PO TABS
1.0000 g | ORAL_TABLET | Freq: Two times a day (BID) | ORAL | Status: DC
  Administered 2023-12-14 – 2023-12-16 (×4): 1 g via ORAL
  Filled 2023-12-14 (×4): qty 1

## 2023-12-14 NOTE — Evaluation (Signed)
 Speech Language Pathology Assessment and Plan  Patient Details  Name: Sherri [name] MRN: [id number] Date of Birth: [birth year]  SLP Diagnosis: Dysphagia  Rehab Potential: Fair ELOS: 12-14 days   Today's Date: 12/15/2023 SLP Individual Time: 0815-0908 SLP Individual Time Calculation (min): 53 min  Hospital Problem: Principal Problem:   History of revision of total replacement of right hip joint Active Problems:   Postoperative wound infection   Closed fracture of right elbow  Past Medical History:  Past Medical History:  Diagnosis Date   Anxiety    takes Valium  daily as needed   Basal cell carcinoma 01/21/1988   left nostril (MOHS), sup-right calf (CX35FU)   Basal cell carcinoma 03/22/1996   right calf (CX35FU)   Basal cell carcinoma 03/31/1995   left wing nose   Bruises easily    d/t meds   Cancer (HCC)    basal cell ca, in situ- uterine    Cataracts, bilateral    removed bilateral   Chronic back pain    Dizziness    r/t to meds   GERD (gastroesophageal reflux disease)    no meds on a regular basis but will take Tums if needed   Headache(784.0)    r/t neck issues   History of bronchitis 6-41yrs ago   History of colon polyps    benign   HOH (hard of hearing)    wears hearing aids   Hyperlipidemia    takes Atorvastatin  on Mondays and Fridays   Hypertension    Joint pain    Joint swelling    Multiple sclerosis (HCC)    Neuromuscular disorder (HCC)    Dr. FABIENE Crete- [facility], follows [facility]   Nocturia    PONV (postoperative nausea and vomiting)    trouble urinating after surgery in 2014   Postoperative nausea and vomiting 01/11/2019   Rheumatoid arthritis (HCC)    Dr Lorrayne weekly, RA- hands- knees- feet    Rheumatoid arthritis(714.0)    Dr Dolphus takes Xeljanz  daily   Right wrist fracture    Skin cancer    Squamous cell carcinoma of skin 11/02/2001   in situ-right knee (cx64fu)   Squamous cell carcinoma of skin 11/12/2011   in  situ-left forearm (CX35FU), in situ-left foot (CX35FU)   Squamous cell carcinoma of skin 01/22/2012   in situ-left lower forearm (txpbx)   Squamous cell carcinoma of skin 11/17/2012   right shin (txpbx)   Squamous cell carcinoma of skin 10/28/2013   in situ-right shoulder (CX35FU)   Squamous cell carcinoma of skin 04/28/2014   well diff-right shin (txpbx)   Squamous cell carcinoma of skin 11/02/2014   in situ-Left shin (txpbx)   Squamous cell carcinoma of skin 12/15/2014   in situ-Left hand (txpbx), bowens-left side chest (txpbx)   Squamous cell carcinoma of skin 05/09/2015   in situ-left hand (txpbx)    Squamous cell carcinoma of skin 05/07/2016   in situ-left inner shin,ant, in situ-left inner shin, post, in situ-left outer forearm, in situ-right knuckle   Squamous cell carcinoma of skin 03/1302018   in situ-left shoulder (txpbx), in situ-right inner shin (txpbx), in situ-top of left foot (txpbx), KA- left forearm (txpbx)   Squamous cell carcinoma of skin 12/02/2016   in situ-left inner shin (txpbx)   Squamous cell carcinoma of skin 03/04/2017   in situ-left outer sup, shin (txpbx), in situ- right 2nd knuckle finger (txpbx), in situ- Left wrist (txpbx)   Squamous cell carcinoma of skin 04/06/2018   in  situ-above left knee inner (txpbx)   Squamous cell carcinoma of skin 04/21/2018   in situ-right top hand (txpbx)   Squamous cell carcinoma of skin 07/08/2018   in situ-right lower inner shin (txpbx)   Squamous cell carcinoma of skin 04/27/2019   in situ- Left neck(CX35FU), In situ- right neck (CX35FU)   Urinary retention    sees Dr.Wrenn about 2 times a yr   Past Surgical History:  Past Surgical History:  Procedure Laterality Date   ABDOMINAL HYSTERECTOMY     1985   ANTERIOR CERVICAL DECOMP/DISCECTOMY FUSION N/A 04/23/2022   Procedure: Anterior Cervical Decompression/Discectomy Fusion - Cervical Seven-Thoracic One;  Surgeon: Louis Shove, MD;  Location: [facility];  Service:  Neurosurgery;  Laterality: N/A;  3C   APPENDECTOMY     with TAH   BACK SURGERY     several   BREAST BIOPSY Right    Results were negative   BROW LIFT Bilateral 10/02/2016   Procedure: BILATERAL LOWER LID BLEPHAROPLASTY;  Surgeon: Laurie Loyd Redhead, MD;  Location: [facility] OR;  Service: Plastics;  Laterality: Bilateral;   CARPAL TUNNEL RELEASE Right    cataracts     CERVICAL FUSION     Dr Leeann   CERVICAL FUSION  12/31/2012   Dr Leeann   CERVICAL FUSION  04/2022   CHEST TUBE INSERTION     for traumatic Pneumothorax   COLONOSCOPY  07/16/2010   normal    COLONOSCOPY W/ POLYPECTOMY  1997   negative since; Dr Debrah   ESOPHAGOGASTRODUODENOSCOPY  07/16/2010   normal   eye lid raise     EYE SURGERY Bilateral    cataracts removed - /w IOL   HARDWARE REMOVAL  08/2021   HIP CLOSED REDUCTION Right 11/16/2023   Procedure: CLOSED MANIPULATION, JOINT, HIP;  Surgeon: Ernie Cough, MD;  Location: [facility];  Service: Orthopedics;  Laterality: Right;   HIP CLOSED REDUCTION N/A 11/30/2023   Procedure: CLOSED REDUCTION, HIP;  Surgeon: Fidel Rogue, MD;  Location: [facility];  Service: Orthopedics;  Laterality: N/A;   INCISION AND DRAINAGE OF WOUND Right 11/28/2023   Procedure: IRRIGATION AND DEBRIDEMENT WOUND; REMOVAL OF HARDWARE;  Surgeon: Edna Toribio LABOR, MD;  Location: [facility];  Service: Orthopedics;  Laterality: Right;  I & D RIGHT ELBOW   JOINT REPLACEMENT     LUMBAR FUSION     Dr Leeann   NASAL SINUS SURGERY     ORIF ELBOW FRACTURE Right 11/20/2023   Procedure: OPEN REDUCTION INTERNAL FIXATION (ORIF) ELBOW/OLECRANON FRACTURE;  Surgeon: [name] Franky SQUIBB, MD;  Location: [facility];  Service: Orthopedics;  Laterality: Right;   POSTERIOR CERVICAL FUSION/FORAMINOTOMY N/A 12/31/2012   Procedure: POSTERIOR LATERAL CERVICAL FUSION/FORAMINOTOMY LEVEL 1 CERVICAL THREE-FOUR WITH LATERAL MASS SCREWS;  Surgeon: Catalina CHRISTELLA Leeann, MD;  Location: [facility];  Service: Neurosurgery;  Laterality: N/A;   PTOSIS REPAIR  Bilateral 10/02/2016   Procedure: INTERNAL PTOSIS REPAIR;  Surgeon: Laurie Loyd Redhead, MD;  Location: [facility];  Service: Plastics;  Laterality: Bilateral;   SKIN BIOPSY     THYROID  SURGERY     R lobe removed- 1972, has grown back - CT last done- 2018   TOTAL HIP ARTHROPLASTY  12/22/2011   Procedure: TOTAL HIP ARTHROPLASTY;  Surgeon: Dempsey JINNY Sensor, MD;  Location: [facility];  Service: Orthopedics;  Laterality: Left;   TOTAL HIP ARTHROPLASTY Right 11/12/2023   Procedure: ARTHROPLASTY, HIP, TOTAL, ANTERIOR APPROACH;  Surgeon: Fidel Rogue, MD;  Location: [facility];  Service: Orthopedics;  Laterality: Right;  TOTAL HIP REVISION Right 12/03/2023   Procedure: TOTAL HIP REVISION;  Surgeon: Fidel Rogue, MD;  Location: [facility];  Service: Orthopedics;  Laterality: Right;  Right acetabular revision Anterior approach   TOTAL SHOULDER ARTHROPLASTY     TUBAL LIGATION     UPPER GASTROINTESTINAL ENDOSCOPY  2012   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   WRIST SURGERY Left 09/16/2022   wrist/arm    Assessment / Plan / Recommendation Clinical Impression  Pt is an 83 year old female with medical hx significant for: ORIF right olecranon (11/20/23), anxiety, dementia, GERD, rheumatoid arthritis, HTN, multiple sclerosis, lipidemia, right olecranon fx (5/14), right femoral neck fx (6/17), right THA (6/19), dislocated right THA (6/22), closed reduction right THA (6/23), . Pt [facility] to [facility] on [facility]23 d/t exposed surgical hardware from previous elbow surgery 1 week prior. Pt also noted to have closed fracture of right hip; binder in place from previous repair. Orthopedics consulted. X-ray showed fracture of right elbow with malposition screw. Pt underwent I&D with hardware removal, olecranon excision and triceps advancement on 11/28/23 by Dr. Edna. Hgb 6.8 on 7/6; transfused 2 units PRBC. Noted right LE shortened on 7/7. X-ray showed right hip dislocation. Pt taken to OR for closed hip reduction by Dr.  Fidel on 7/7; not successful. Pt underwent right THA revision with head ball and liner exchange by Dr. Fidel on 12/03/23. Therapy evaluations completed and CIR recommended d/t pt's deficits in functional mobility.   Patient underwent MBS during acute care stay with recommendations for NTLs, however refused recommendations. Over recent days, has been coughing in increasing amounts with POs and is now open to SLP recommendations.  Bedside Swallow Evaluation: SLP conducted bedside swallow evaluation. SLP assessed tolerance of medications taken whole. Patient independently stated she needed to take medications in puree, however despite this strategy, patient was observed to swallow large pill and hawk pill back up 3 separate times, requiring eventual pill crushing. Discussed with LPN the need to crush large pills from now on during stay. Patient exhibits frank and frequent overt immediate cough with thin liquids consistently throughout session. Discussed with patient results from prior MBS completed on acute care prior to rehab admission which revealed moderate to severe pharyngeal dysphagia secondary to structural changes post-ACDF and was classified as chronic in nature, though with deconditioning likely worsening overall swallow function. Recommendations for MBS were Dys2/NTLs to assist with pharyngeal clearance and safety, however patient refused diet modifications. During MBS, pharyngeal strengthening exercises were also hypothecized to be of benefit, thus were initiated but not continued. This date, patient continues to refuse any recommended diet modifications but was agreeable to continuing with pharyngeal strengthening exercises initiated in acute care venue. Provided education re: risks and sequelae of frequent gross aspiration, though patient's stance remains unchanged.  SLP transitioned into swallowing treatment tasks; provided patient with handout outlining pharyngeal strengthening exercises and  facilitated completion of effortful swallows x10 and Masako swallows x10, She benefited from min assist throughout. SLP tasked patient with completing exercises 3x/day, though question carryover and willingness. Patient states that the reason she has swallowing difficulty is because she is not at home, despite thorough education re: swallowing mechanism and current deficits from SLP. Will continue education. Patient was left in lowered bed with call bell in reach and bed alarm set. SLP will continue to target goals per plan of care.     Skilled Therapeutic Interventions          SLP conducted skilled evaluation  session to assess swallowing function. Utilized bedside swallow evaluation and patient and/or family interview. Full results above.   SLP Assessment  Patient will need skilled Speech Lanaguage Pathology Services during CIR admission    Recommendations  SLP Diet Recommendations: Dysphagia 2 (Fine chop);Nectar (Recommendations from a safety standpoint remain Dys2/NTLs, however patient refusing modifications thus will maintain regular/thin liquid diet despite risks.) Liquid Administration via: Straw;Cup Medication Administration: Whole meds with puree (crush large pills) Supervision: Patient able to self feed;Intermittent supervision to cue for compensatory strategies Compensations: Slow rate;Small sips/bites;Follow solids with liquid Postural Changes and/or Swallow Maneuvers: Seated upright 90 degrees Oral Care Recommendations: Oral care BID Recommendations for Other Services: Neuropsych consult Patient destination: Home Follow up Recommendations: Home Health SLP Equipment Recommended: None recommended by SLP    SLP Frequency 1 to 3 out of 7 days   SLP Duration  SLP Intensity  SLP Treatment/Interventions 12-14 days   30-90 minutes   Patient/family education;Therapeutic Exercise;Dysphagia/aspiration precaution training    Pain Pain Assessment Pain Scale: 0-10  Prior  Functioning Cognitive/Linguistic Baseline: Baseline deficits Baseline deficit details: dementia Type of Home: House  Lives With: Spouse Available Help at Discharge: Family;Available PRN/intermittently;Available 24 hours/day;Personal care attendant Vocation: Retired  Care Tool Care Tool Cognition Ability to hear (with hearing aid or hearing appliances if normally used Ability to hear (with hearing aid or hearing appliances if normally used): 1. Minimal difficulty - difficulty in some environments (e.g. when person speaks softly or setting is noisy)   Expression of Ideas and Wants Expression of Ideas and Wants: 4. Without difficulty (complex and basic) - expresses complex messages without difficulty and with speech that is clear and easy to understand   Understanding Verbal and Non-Verbal Content Understanding Verbal and Non-Verbal Content: 4. Understands (complex and basic) - clear comprehension without cues or repetitions  Memory/Recall Ability Memory/Recall Ability : Current season;That he or she is in a hospital/hospital unit;Staff names and faces   Bedside Swallowing Assessment General Date of Onset: 05/01/23 Previous Swallow Assessment: MBS 7/7 Diet Prior to this Study: Regular;Thin liquids (Level 0) Respiratory Status: Supplemental O2 delivered via (comment) History of Recent Intubation: No Behavior/Cognition: Alert;Cooperative Oral Cavity - Dentition: Adequate natural dentition Self-Feeding Abilities: Able to feed self Vision: Functional for self-feeding Patient Positioning: Upright in bed Baseline Vocal Quality: Normal Volitional Cough: Strong Volitional Swallow: Able to elicit  Oral Care Assessment   Ice Chips Ice chips: Not tested Thin Liquid Thin Liquid: Impaired Presentation: Straw Pharyngeal  Phase Impairments: Throat Clearing - Immediate;Throat Clearing - Delayed;Cough - Immediate;Cough - Delayed Nectar Thick Nectar Thick Liquid: Not tested Honey Thick Honey  Thick Liquid: Not tested Puree Puree: Impaired Presentation: Spoon Pharyngeal Phase Impairments: Throat Clearing - Delayed;Throat Clearing - Immediate Solid Solid: Impaired Presentation: Self Fed Pharyngeal Phase Impairments: Throat Clearing - Delayed;Cough - Immediate;Throat Clearing - Immediate;Cough - Delayed Other Comments: globus sensation BSE Assessment Risk for Aspiration Impact on safety and function: Severe aspiration risk Other Related Risk Factors: History of dysphagia;Cognitive impairment;Deconditioning  Short Term Goals: Week 1: SLP Short Term Goal 1 (Week 1): STGs=LTGs d/t ELOS  Refer to Care Plan for Long Term Goals  Recommendations for other services: Neuropsych  Discharge Criteria: Patient will be discharged from SLP if patient refuses treatment 3 consecutive times without medical reason, if treatment goals not met, if there is a change in medical status, if patient makes no progress towards goals or if patient is discharged from hospital.  The above assessment, treatment plan, treatment alternatives and goals  were discussed and mutually agreed upon: by patient  Florencia Zaccaro, M.A., CCC-SLP  Fay Swider A Khaled Herda 12/15/2023, 9:19 AM

## 2023-12-14 NOTE — Progress Notes (Addendum)
 Physical Therapy Session Note  Patient Details  Name: Sherri  TIFFANI [name] MRN: [id number] Date of Birth: [birth year]  Today's Date: 12/14/2023 PT Individual Time: 8883-8843 PT Individual Time Calculation (min): 40 min  PT missed time: 60 min (fatigue/refusal)   Short Term Goals: Week 1:  PT Short Term Goal 1 (Week 1): pt will perform sit to stand with LRAD and CGA PT Short Term Goal 2 (Week 1): pt will perform bed to chair transfer with RW and min A PT Short Term Goal 3 (Week 1): pt will ambuate 5 feet with LRAD and min A  Skilled Therapeutic Interventions/Progress Updates:      Treatment Session 1  Pt seated in WC upon arrival. Pt agreeable to therapy. Pt reports 7/10 pain in R hip, nurse present to administer pain medication. Therapist provided rest breaks and repositioning.   Of note: pt coughing increasingly when drinking thin liquids, and noted pill that was spit up (notified nurse)--pt son present and reports pt appears to be coughing more than she was initially. Recommended SLP consult, notified IDT--pt and pt son confirm pt previously refusing thickened liquids recommended by SLP but willing to have additional consult.   Pt son confirms pt will have 24/7 care between himself, his sister, and a rotating personal care attendant. Scheduled family training for 7/28 10-12 am with pt son and daughter.   Pt performed sit to stand x2 with RW and min-mod A, attempted further sit to stand for transfer to [facility], and to bed however pt unable 2/2 to pain/fatigue. Pt refusing to attempt ambulation 2/2 pain.   Initiated slide board transfer, pt performed with CGA/minA , verbal cues provided for technique with PT ensuring adherence to posterior hip precautions.   Sit to supine with min A for management of R LE.   Pt supine in bed at end of session with all needs within reach and bed alarm on.   Treatment Session 2   Pt supine asleep in bed upon arrival. Pt awoken and initially agreeable to  therapy, however having difficulty sustaining attention 2/2 pt falling asleep. PT requesting therapist to return tomorrow 2/2 fatigue.   Pt missed 60 min of therapy 2/2 fatigue. Pt supine in bed with all needs within reach and bed alarm on.    Therapy Documentation Precautions:  Precautions Precautions: Fall, Posterior Hip, Other (comment) (PWB RUE- only for walking) Precaution Booklet Issued: No Recall of Precautions/Restrictions: Impaired Precaution/Restrictions Comments: R JP drain, RLE wound vac, MASD Required Braces or Orthoses: Splint/Cast Knee Immobilizer - Right: On at all times Splint/Cast: RUE Restrictions Weight Bearing Restrictions Per Provider Order: Yes RUE Weight Bearing Per Provider Order: Partial weight bearing RUE Partial Weight Bearing Percentage or Pounds: okay to use walker, confirmed with MD/ortho, okay to weight bear right UE as long as R elbow remains extended RLE Weight Bearing Per Provider Order: Weight bearing as tolerated Other Position/Activity Restrictions: posterior hip precautions  Therapy/Group: Individual Therapy  [facility] [name], [place], DPT  12/14/2023, 12:36 PM

## 2023-12-14 NOTE — Progress Notes (Signed)
 13 Day Post-Op Procedure(s) (LRB): IRRIGATION AND DEBRIDEMENT RIGHT ELBOW  Subjective: Patient reports pain as mild about the right elbow, resting.  New removable splint holding up well.  Denies numbness or tingling of right arm.  Patient in CIR.   No overnight events, no other concerns today.  Right hip is being managed by Dr. Fidel.    Objective:   VITALS:   Vitals:   12/13/23 1510 12/13/23 1930 12/13/23 1939 12/14/23 0518  BP: (!) 139/102 (!) 137/95 120/64 127/74  Pulse: 95 89 91 93  Resp: 18  19 18   Temp: 98.3 F (36.8 C)  97.9 F (36.6 C) 98 F (36.7 C)  TempSrc:      SpO2: 96%  97% 99%  Weight:      Height:        Resting comfortably in bed in NAD Sensation intact distally Intact pulses distally Compartment soft Splint clean, dry, and intact Wiggles fingers appropriately; AIN/PIN/IO appear grossly intact No significant swelling adjacent to splint (proximally or distally) Skin very thin, warm, and dry.  Visible skin of right arm ok.  Splint was removed today and wound was assessed, skin healing well with sutures intact.  No erythema or drainage.   Lab Results  Component Value Date   WBC 7.6 12/14/2023   HGB 11.4 (L) 12/14/2023   HCT 35.0 (L) 12/14/2023   MCV 98.9 12/14/2023   PLT 176 12/14/2023   BMET    Component Value Date/Time   NA 132 (L) 12/14/2023 0513   NA 139 06/20/2019 1335   NA 136 02/22/2015 0000   K 3.6 12/14/2023 0513   K 4.4 02/22/2015 0000   CL 99 12/14/2023 0513   CL 98 02/22/2015 0000   CO2 19 (L) 12/14/2023 0513   CO2 30 02/22/2015 0000   GLUCOSE 75 12/14/2023 0513   BUN <5 (L) 12/14/2023 0513   BUN 10 06/20/2019 1335   CREATININE 0.49 12/14/2023 0513   CREATININE 1.03 (H) 08/06/2023 1510   CALCIUM  7.8 (L) 12/14/2023 0513   CALCIUM  9.6 02/22/2015 0000   EGFR 55 (L) 08/06/2023 1510   GFRNONAA >60 12/14/2023 0513   GFRNONAA 56 (L) 09/26/2020 1536    Xray: Postop x-rays show interval removal of hardware with resection  of olecranon fragment   Assessment/Plan:     Principal Problem:   History of revision of total replacement of right hip joint Active Problems:   Postoperative wound infection   Closed fracture of right elbow   Status post right elbow hardware removal with olecranon excision and triceps advancement 11/28/2023  12/01/23: Patient resting.  Looking to resume PT today.  Unsuccessful reduction by Dr. Fidel in OR yesterday see notes.  May resplint RUE for improved flexion prevention.  Discussed with Dr. Edna.  Likely SNF 12/02/23: Wound appears to be healing okay.  Splint reapplied with medial and lateral A-frame to reduce flexion capabilities.  No sling recommended but can use pillows to prop arm up for support/reduced strain.  RUE still grossly neurovascularly intact post splint application.  Can keep this on until follow up with Dr. Edna.  Still no weight bearing of RUE, plan to reassess at follow up 10-14 days post-op. 7/12: Resting.  Splint situated okay, no significant adjacent swelling.Possible CIR, pending eval. 7/18: Splint has been holding up ok.  Examination ok.  Plan for OT to make a removable splint for right arm, can allow better access for wound care as needed.  Can do some light weight  bearing on arm for walker use.  Likely suture removal Monday. 7/21: New removable RUE splint holding up okay. Removed to assess wound, sutures removed, and arm re-dressed/splint reapplied.  Skin healing well.  Can shower as normal.  Continue use of splint for another 2-4 weeks.  Continue light weight bearing on arm for walker use.  X-rays pending.    Post op recs: WB: Immobilized in a long-arm splint, okay to use a walker for weightbearing with the right upper extremity Abx: ancef  periop and ceftriaxone  post op then dc on keflex  Imaging: PACU xrays DVT prophylaxis: Aspirin  81mg  daily for 4 weeks Follow up: 10-14 days after surgery for a wound check with Dr. Edna at [facility].  Address: [address], [place], [place] [address]  Office Phone: [phone]   Bernarda CHRISTELLA Mclean 12/14/2023, 6:18 AM     Contact information:   Weekdays 7am-5pm epic message Dr. Edna, or call office for patient follow up: [phone] After hours and holidays please check [email or web address] for group call information for [facility]

## 2023-12-14 NOTE — Discharge Summary (Signed)
 Physician Discharge Summary  Patient ID: Sherri [name]  NICKY MILHOUSE MRN: [id number] DOB/AGE: 82-Jan-1942 81 y.o.  Admit date: 12/08/2023 Discharge date: 12/23/2023  Discharge Diagnoses:  Principal Problem:   History of revision of total replacement of right hip joint Active Problems:   Postoperative wound infection   Closed fracture of right elbow Hyponatremia  Syndrome of inappropriate antidiuretic hormone secretion  Hypomagnesia Urinary retention Chronic pain Macrocytic anemia Dysphagia Rheumatoid arthritis   Discharged Condition: Stable  Labs:  Basic Metabolic Panel: Recent Labs  Lab 12/17/23 0513 12/18/23 0521 12/19/23 0519 12/20/23 0612 12/21/23 0535 12/22/23 0456 12/23/23 0432  NA 128* 131* 128* 128* 129* 134* 131*  K 3.7 3.7 3.4* 3.9 3.4* 3.8 3.3*  CL 95* 98 98 98 96* 101 98  CO2 24 24 21* 22 24 23 25   GLUCOSE 79 81 74 96 80 69* 83  BUN <5* <5* <5* 6* 14 11 5*  CREATININE 0.56 0.52 0.46 0.52 0.68 0.49 0.52  CALCIUM  7.5* 7.4* 7.0* 7.8* 8.1* 7.9* 7.7*  MG  --   --   --   --   --  1.5* 1.5*  PHOS  --  2.2* 2.3* 2.7 3.3 3.2  --     CBC: Recent Labs  Lab 12/17/23 0513 12/21/23 0535  WBC 7.8 4.1  NEUTROABS 7.2 2.6  HGB 8.6* 9.1*  HCT 25.8* 27.5*  MCV 95.6 94.5  PLT 179 162    CBG: No results for input(s): GLUCAP in the last 168 hours.  Brief HPI:   Sherri  [name] is a 83 y.o. female with PMHx significant for anxiety, dementia, GERD, rheumatoid arthritis on methotrexate  and prednisone , with osteoporosis, most likely; , HTN, multiple sclerosis, lipidemia, chronic pain, R olecranon fx (5/14), right femoral neck fx (6/17), right THA (6/19), dislocated THA (6/22), closed reduction right THA (6/23), and ORIF right olecranon (6/27).  Patient presented to [facility] on 11/26/2023 due to exposed surgical hardware and right elbow from previous elbow surgery 1 week prior. Orthopedics consulted.  X-ray showed fracture of right elbow with malposition screw and the patient  underwent I&D with hardware removal, olecranon excision and triceps advancement on 11/28/2023 by Dr.Marchwiany.  Started on IV antibiotics for cellulitis, transitioned to doxy and Duricef for 4-6 weeks.  Labs in the ED showed hyponatremia 129 WBC 11.8 hemoglobin 8.1. Hemoglobin trended down to 6.8, and she received two units PRBC.    Additionally, the patient was found to have right lower extremity shortening, and an X-ray revealed a right hip dislocation. A closed hip reduction was attempted but was unsuccessful on 11/30/2023. Subsequently, the patient underwent a right total hip arthroplasty (THA) revision with head, ball, and liner change by Dr. Cherylene on 12/03/2023. Orthopedic surgery recommended aspirin  for 4 weeks. She will continue oral antibiotics for 4 to 6 weeks for cellulitis. Prior to arrival, the patient was living in a one-level home, able to live on the main level, using a walker. Currently requiring max assistance plus two for physical assistance, mod assist +2 for safety and equipment, using a rolling walker for ambulation. Therapy evaluations completed due to patient decreased functional mobility was admitted for a comprehensive rehab program.    Hospital Course: Ahmaya  A Lowden was admitted to rehab 12/08/2023 for inpatient therapies to consist of PT, ST and OT at least three hours five days a week. Past admission physiatrist, therapy team and rehab RN have worked together to provide customized collaborative inpatient rehab.   The patient, a 83 year old female, was admitted with  debility related to a right elbow wound dehiscence complicated by cellulitis and a right second hip dislocation following a right total hip revision surgery. She remained hemodynamically stable throughout her hospital stay. The patient had previously undergone hardware removal on November 27, 2023, and completed a two-week course of antibiotics. She experienced a superior dislocation of her right hip arthroplasty, which was  addressed with a closed reduction on July 7. During her stay, a Jackson-Pratt drain was removed, and a Pravena wound vac was applied at discharge to manage the wound site effectively. Pain management was achieved through a multimodal approach, including acetaminophen , and low-dose opioids, which provided effective relief.  Diffuse ecchymosis related to multiple wounds are present on admission and were treated with daily dressing changes.  Platelets were monitored with routine labs patient should follow-up with hematology outpatient.  The patient has a history of urinary urgency, overactive bladder, and rheumatoid arthritis, managed with methotrexate  and prednisone . Cymbalta  was continued for chronic pain management. Palliative Services evaluated the patient on July 5 for supportive care needs and signed off after the evaluation. She presented with macrocytic anemia, for which she received three units of packed red blood cells and was maintained on folate and B12 supplementation. Current studies suggest anemia of chronic disease. Aspirin  81 mg held due to bleeding risk.  Maintain on SCDs for DVT anticoagulation.Infectious Disease was consulted for cellulitis treatment, and antibiotics were adjusted to Ciprofloxacin  after cultures indicated pseudomonas aeruginosa. A Foley catheter was placed due elevated PVRs and the patient's and family's inability to perform in-and-out catheterizations, and she will follow up with urology in two weeks.   The patient was treated for hypomagnesemia and hypokalemia where she was replaced with IV magnesium  and PO potassium.  She should continue magnesium  oxide and potassium supplements with follow-up labs by her primary care physician within 1 week of discharge.  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 Who recc initiation of ure-NA supplementation for protein supplement, and  furosemide . Outpatient follow up as planned.   Speech-Language Pathology (SLP) was consulted for dysphagia, with recommendations for thickened liquids, but the patient declined and was maintained on a regular diet. A bowel and bladder program was initiated, leading to successful bowel movements and improved constipation management. Blood pressures were monitored on TID basis . Hypertension was managed with her current regimen, and the use of an abdominal binder and TED hose proved beneficial in maintaining stable blood pressures.   Rehab course: During patient's stay in rehab weekly team conferences were held to monitor patient's progress, set goals and discuss barriers to discharge. At admission, patient required max assistance plus two for physical assistance, mod assist +2 for safety and equipment, using a rolling walker for ambulation.  Changed to 15/7 on 7/22 due to fatigue.  She has had improvement in activity tolerance, balance, postural control as well as ability to compensate for deficit. Patient is able to complete transfers with min A with verbal cues for balance and was observed completing dressing activities.  She was able to complete functional mobility with RW for 10 feet with min A.  Family teaching with the patient's son was completed upon discharge to home.  [facility] present at bedside for transportation to patient's home.     Disposition:  Discharge disposition: 01-Home or Self Care        Diet: Regular diet  Special Instructions:  Foley catheter care instructions per handout  -No driving  smoking or alcohol  or illicit drug use    Discharge Instructions     Ambulatory referral to Physical Medicine Rehab   Complete by: As directed      -Maintain Prevena wound VAC until outpatient follow-up visit  - Complete full course of antibiotics  -Continue use of splint for another 2-4 weeks. Continue light weight bearing on arm for walker use   -OK for weightbearing but no  bending elbow-Remove splint for wound care and hygiene otherwise needs to stay on.   -Maintain hip precautions   Allergies as of 12/23/2023       Reactions   Darvon [propoxyphene Hcl] Shortness Of Breath   ? Dose Related ? Lowered respirations greatly   Demerol [meperidine] Shortness Of Breath, Other (See Comments)   Respiratory Distress   Penicillins Hives, Other (See Comments)   Has patient had a PCN reaction causing immediate rash, facial/tongue/throat swelling, SOB or lightheadedness with hypotension: No SEVERE RASH INVOLVING MUCUS MEMBRANES or SKIN NECROSIS: #  #  #  YES  #  #  #  Has patient had a PCN reaction that required hospitalization No Has patient had a PCN reaction occurring within the last 10 years: No.   Imuran [azathioprine] Other (See Comments)   Weakness   Sulfa  Antibiotics Nausea Only, Other (See Comments)   States had severe indigestion and heartburn and nausea and had to stop taking   Arava [leflunomide] Other (See Comments)   Excessive weight gain   Lipitor [atorvastatin ] Nausea And Vomiting   Currently taking 2 times weekly - able to tolerate low dose    Vytorin [ezetimibe-simvastatin] Nausea And Vomiting        Medication List     PAUSE taking these medications    [name]  15 MG Tb24 Wait to take this until your doctor or other care provider tells you to start again. Resume when cleared by your doctors Generic drug: Upadacitinib  ER Take 15 mg by mouth daily.       STOP taking these medications    aspirin  EC 81 MG tablet   diclofenac sodium 1 % Gel Commonly known as: VOLTAREN   mupirocin  ointment 2 % Commonly known as: BACTROBAN    oxybutynin  5 MG tablet Commonly known as: DITROPAN    polyethylene glycol 17 g packet Commonly known as: MIRALAX  / GLYCOLAX  Replaced by: polyethylene glycol powder 17 GM/SCOOP powder       TAKE these medications    acetaminophen  325 MG tablet Commonly known as: TYLENOL  Take 2 tablets (650 mg total) by  mouth every 6 (six) hours as needed for mild pain (pain score 1-3) or fever (or Fever >/= 101).   atorvastatin  10 MG tablet Commonly known as: LIPITOR Take 1 tablet (10 mg total) by mouth every evening. What changed: See the new instructions.   Biotin  5000 MCG Tabs Take 10,000 mcg by mouth in the morning.   bisacodyl  10 MG suppository Commonly known as: DULCOLAX Place 1 suppository (10 mg total) rectally daily as needed for moderate constipation.   carvedilol  3.125 MG tablet Commonly known as: COREG  Take 1 tablet (3.125 mg total) by mouth 2 (two) times daily with a meal.   cefadroxil  500 MG capsule Commonly known as: DURICEF Take 1 capsule (500 mg total) by mouth 2 (two) times daily.   cholecalciferol  25 MCG (1000 UNIT) tablet Commonly known as: VITAMIN D3 Take 1,000 Units by mouth in the morning.   cyclobenzaprine  10 MG tablet Commonly known as: FLEXERIL  TAKE 1 TABLET BY MOUTH  THREE TIMES A DAY AS NEEDED FOR MUSCLE SPASM What changed: See the new instructions.   diazepam  5 MG tablet Commonly known as: VALIUM  TAKE 1 TABLET BY MOUTH AT BEDTIME AS NEEDED FOR MUSCLE SPASMS. What changed: See the new instructions.   docusate sodium  100 MG capsule Commonly known as: COLACE Take 1 capsule (100 mg total) by mouth daily.   DULoxetine  20 MG capsule Commonly known as: CYMBALTA  Take 20 mg by mouth at bedtime.   folic acid  1 MG tablet Commonly known as: FOLVITE  Take 1 tablet (1 mg total) by mouth daily.   furosemide  20 MG tablet Commonly known as: LASIX  Take 0.5 tablets (10 mg total) by mouth daily. What changed: how much to take   Gerhardt's butt cream Crea Apply 1 Application topically 3 (three) times daily.   LUBRICATING EYE DROPS OP Place 1 drop into both eyes daily as needed (dry eyes).   magnesium  oxide 400 (240 Mg) MG tablet Commonly known as: MAG-OX Take 2 tablets (800 mg total) by mouth 2 (two) times daily for 7 days.   methotrexate  2.5 MG tablet Commonly  known as: RHEUMATREX Take 5 tablets (12.5 mg total) by mouth once a week. Caution:Chemotherapy. Protect from light.   oxyCODONE  5 MG immediate release tablet Commonly known as: Roxicodone  Take 1 tablet (5 mg total) by mouth every 4 (four) hours as needed for severe pain (pain score 7-10). What changed: reasons to take this   pantoprazole  40 MG tablet Commonly known as: PROTONIX  Take 1 tablet (40 mg total) by mouth 2 (two) times daily before a meal.   polyethylene glycol powder 17 GM/SCOOP powder Commonly known as: GLYCOLAX /MIRALAX  Take 17 g by mouth 2 (two) times daily. Replaces: polyethylene glycol 17 g packet   potassium chloride  SA 20 MEQ tablet Commonly known as: KLOR-CON  M Take 0.5 tablets (10 mEq total) by mouth every other day.   predniSONE  5 MG tablet Commonly known as: DELTASONE  TAKE 1 TABLET BY MOUTH EVERY DAY IN THE MORNING   Prolia  60 MG/ML Sosy injection Generic drug: denosumab  Inject 60 mg into the skin every 6 (six) months. Deliver to rheum: [address], [place], [place] [address]. Appt on 07/23/2022   senna 8.6 MG tablet Commonly known as: SENOKOT Take 1 tablet by mouth 2 (two) times daily.   sodium chloride  1 g tablet Take 1 tablet (1 g total) by mouth 2 (two) times daily with a meal.   tamsulosin  0.4 MG Caps capsule Commonly known as: FLOMAX  Take 1 capsule (0.4 mg total) by mouth daily after supper.   traMADol  50 MG tablet Commonly known as: ULTRAM  Take 25-50 mg by mouth every 8 (eight) hours as needed for moderate pain (pain score 4-6).   vitamin C  1000 MG tablet Take 1 tablet (1,000 mg total) by mouth daily.   Zinc  Sulfate 220 (50 Zn) MG Tabs Take 1 tablet (220 mg total) by mouth daily with supper.        Follow-up Information     Edna Toribio LABOR, MD Follow up.   Specialty: Orthopedic Surgery Why: Call for an appointment. (Arm) Contact information: [address] [place] [address] [phone]          [name] PARAS, MD Follow up.   Specialty: Internal Medicine Why: Call for an appointment 1-2 weeks Contact information: [address] [place] [address] [phone]         Fidel Rogue, MD Follow up.   Specialty: Orthopedic  Surgery Why: Call in 1-2 days for post hospital follow up (Leg) Contact information: [address] [place] [address] [phone]         Nicholaus Sherlyn CROME, NP Follow up.   Why: You are scheduled for an appointment on  January 05, 2024 at 9:30 a.m. Contact information: [address] [place] [address] [phone]         Melia Lynwood ORN, MD. Call.   Specialties: Nephrology, Vascular Surgery Why: Call for an appointment. Contact information: [address] [place] [address] [phone]                 Signed: Daphne CROME Finders 12/23/2023, 12:23 PM

## 2023-12-14 NOTE — Progress Notes (Addendum)
 PROGRESS NOTE   Subjective/Complaints:  Patient lying in bed.  No new concerns elicited.  She continues to have urinary retention.  ROS: as per HPI. Denies CP, SOB, abd pain, N/V/D/C, or any other complaints at this time.   + chronic joint pain +nausea, vomiting- resolved + bladder/urine retention   Objective:   DG Elbow 2 Views Right Result Date: 12/14/2023 CLINICAL DATA:  83 year old female with arm in cast. 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 EXAM: RIGHT ELBOW - 2 VIEW COMPARISON:  Postoperative radiographs 11/28/2023 and earlier. FINDINGS: Two views 0730 hours. Overlying cast or splint. Fragmentation of the proximal ulna, olecranon on appears mildly improved, condensed since 11/28/2023. No new or progressive periosteal reaction identified. Stable regional bone mineralization. No new fracture or dislocation identified. No radiopaque foreign body identified. IMPRESSION: Abnormal but mildly improved radiographic appearance of the proximal ulna since 11/28/2023. No new osseous abnormality identified. Electronically Signed   By: VEAR Hurst M.D.   On: 12/14/2023 09:26   Recent Labs    12/14/23 0513  WBC 7.6  HGB 11.4*  HCT 35.0*  PLT 176    Recent Labs    12/12/23 0707 12/14/23 0513  NA 131* 132*  K 3.7 3.6  CL 96* 99  CO2 22 19*  GLUCOSE 83 75  BUN 5* <5*  CREATININE 0.57 0.49  CALCIUM  7.7* 7.8*    Intake/Output Summary (Last 24 hours) at 12/14/2023 1112 Last data filed at 12/14/2023 0615 Gross per 24 hour  Intake 356 ml  Output 1395 ml  Net -1039 ml        Physical Exam: Vital Signs Blood pressure 127/74, pulse 93, temperature 98 F (36.7 C), resp. rate 18, height 5' (1.524 m), weight 56.1 kg, SpO2 99%.  General: appears comfortable resting in bed HEENT: Head is normocephalic, atraumatic, sclera anicteric, oral mucosa a little dry Neck: Supple without JVD or  lymphadenopathy Heart: Reg rate and rhythm. No murmurs rubs or gallops Chest: CTA bilaterally without wheezes, rales, or rhonchi; no distress Abdomen: Soft, non-tender, non-distended but bladder feels full, bowel sounds positive. Extremities: + trace LE edema Psych: Pt's affect is appropriate. Pt is cooperative pleasant  Skin: skin tear to R distal thigh, covered with gauze. Thin skin, multiple skin tears and covered areas, scattered bruising.   Severely bruised/purple R labia Significant MASD and raw buttocks and genitals/perineum- not checked today  Neuro:  Awake but a little drowsy, Oriented x3, sensation intact in all 4 extremities to LT, hard of hearing  Prior neuro assessment is c/w today's exam 12/14/2023.   MSK:  Arthritic changes noted in her hands RUE with splint and ace wrap R leg has drain with small amount sanguinous drainage in bulb and VAC also has small amount drainage in it RUE- 4-4+/5 R deltoids and can wiggle fingers LUE 4+/5 throughout RLE- HF 2/5;  hard to bend knee, but at least 3/5;DF/PF 4/5 LLE- 5-/5 throughout     Assessment/Plan: 1. Functional deficits which require 3+ hours per day of interdisciplinary therapy in a comprehensive inpatient rehab setting. Physiatrist is providing close team supervision and 24 hour management of active medical problems listed below. Physiatrist and  rehab team continue to assess barriers to discharge/monitor patient progress toward functional and medical goals  Care Tool:  Bathing    Body parts bathed by patient: Right arm, Left upper leg, Left arm, Chest, Abdomen, Front perineal area, Right upper leg, Face   Body parts bathed by helper: Right lower leg, Left lower leg, Buttocks, Front perineal area     Bathing assist Assist Level: Moderate Assistance - Patient 50 - 74%     Upper Body Dressing/Undressing Upper body dressing   What is the patient wearing?: Pull over shirt    Upper body assist Assist Level: Set up  assist    Lower Body Dressing/Undressing Lower body dressing      What is the patient wearing?: Pants, Incontinence brief     Lower body assist Assist for lower body dressing: Maximal Assistance - Patient 25 - 49%     Toileting Toileting Toileting Activity did not occur (Clothing management and hygiene only): N/A (no void or bm)  Toileting assist Assist for toileting: Maximal Assistance - Patient 25 - 49%     Transfers Chair/bed transfer  Transfers assist     Chair/bed transfer assist level: Moderate Assistance - Patient 50 - 74%     Locomotion Ambulation   Ambulation assist   Ambulation activity did not occur: Safety/medical concerns          Walk 10 feet activity   Assist  Walk 10 feet activity did not occur: Safety/medical concerns        Walk 50 feet activity   Assist Walk 50 feet with 2 turns activity did not occur: Safety/medical concerns         Walk 150 feet activity   Assist Walk 150 feet activity did not occur: Safety/medical concerns         Walk 10 feet on uneven surface  activity   Assist Walk 10 feet on uneven surfaces activity did not occur: Safety/medical concerns         Wheelchair     Assist Is the patient using a wheelchair?: Yes Type of Wheelchair: Manual    Wheelchair assist level: Dependent - Patient 0%      Wheelchair 50 feet with 2 turns activity    Assist        Assist Level: Dependent - Patient 0%   Wheelchair 150 feet activity     Assist      Assist Level: Dependent - Patient 0%   Blood pressure 127/74, pulse 93, temperature 98 F (36.7 C), resp. rate 18, height 5' (1.524 m), weight 56.1 kg, SpO2 99%.  Medical Problem List and Plan: 1. 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 She also had R elbow hardware removal and olecranon excision and triceps advancement 11/28/23.             -patient may not  shower due to VAC              -ELOS/Goals: 12-14 days- Superviison to min A WBAT with walker RLE-posterior hip precautions Partial weightbearing RUE okay to use walker per Dr. Edna- s/p R elbow hardware removal - Message ortho to confirm wt bearing use with walker- OK for weightbearing but no bending elbow-discussed with Dr. Edna, appreciate assistance.  Okay to remove splint when thermoplastic splint is ready.  Okay to remove this for wound care and hygiene otherwise needs to stay on.  -Continue CIR - Patient was seen by Kemp Hila  [place]-possible suture removal Monday, plan for right upper extremity splint to be made today 12/11/2023 -Addendum: social work note indicates step-grandson committed suicide  - Patient seen by orthopedics today 7/21 2.  Antithrombotics: -DVT/anticoagulation:  Mechanical:  Antiembolism stockings, knee (TED hose) Bilateral lower extremities Sequential compression devices, below knee Bilateral lower extremities             -antiplatelet therapy: Aspirin  81 mg held due to bleeding risk  3. Pain Management: Oxycodone  and Tylenol  as needed             - Continue Cymbalta  20 mg daily and Flexeril  10 mg at bedtime 4. Mood/Behavior/Sleep: ego support             -antipsychotic agents: N/A 5. Neuropsych/cognition: This patient is intermittently capable of making decisions on her own behalf.  6. Skin/Wound Care: Routine pressure relief measures             -JP drain-per Ortho will discontinue when output < 10 cc per shift             -Continue negative pressure at 75 mmHg--can be exchanged to Prevena  - MASD-7/15 start Diflucan  200 mg then 100 mg x 6 days -Gerhardt's  -Diffuse ecchymoses- Platelets 330 CTM Routine labs, follow-up with derm/hematology outpatient.  7. Fluids/Electrolytes/Nutrition: Monitor I's and O's, routine labs             -Diet Heart Healthy-- SLP recc nectar thick- pt not agreeable  -Ensure High protein + Zinc  and vitamin C    8. Hypertension/  intermittent mild tachycardia: Continue Coreg  3.125 mg BID,              -7/18-21 BP controlled, continue to monitor   Vitals:   12/11/23 1915 12/12/23 0509 12/12/23 1459 12/12/23 1831  BP: 125/60 113/89 130/69 127/71   12/12/23 1916 12/13/23 0338 12/13/23 0344 12/13/23 0910  BP: (!) 111/44 (!) 143/127 130/63 133/66   12/13/23 1510 12/13/23 1930 12/13/23 1939 12/14/23 0518  BP: (!) 139/102 (!) 137/95 120/64 127/74      9. Rheumatoid arthritis-continue home prednisone  5 mg daily and methotrexate  12.5mg  weekly- methotrexate  just restarted.   10. Microcytic anemia/acute blood loss anemia: monitor for signs of bleeding recheck CBC in a.m   -normal folate & B12, iron study suggestive of AOC, status post 3 units PRBC. -7/21 HGB stable 11.4  11. Severe MASD: will try Diflucan  200 mg x1 and 100 mg daily x 6 days and monitor skin  12. Constipation- senna 8.6mg  BID  - 7/16 reports improvement after multiple bowel movements yesterday  -7/17 LMB 7/15, continue to monitor  -7/18 miralrax daily, PRN sorbitol   -12/12/23 LBM this morning, monitor  13. Dysphagia, chronic  -Prior SLP eval re nectar thick, she has been refusing modification/nectar thick liquids  14. Hypokalemia/Hyponateria  -7/17 potassium x2 today, recheck tomorrow 7/18 K+ up to 4.5, NA lower at 128  -Check urine sodium/osmolality due to hyponatremia, asymptomatic,  Cr higher then her baseline at 0.8 today, HGB higher on 7/15 and 7/17 think she is a little fluid depleted, add NS 50ml/hr and monitor response  -Cr 0.57, Na 131, K 3.7. will stop fluids today and recheck Monday. Urine studies not done yet.  -7/21 Sodium up to 132, will add 1 g sodium chloride  twice daily  15. Nausea  -compazine  PRN -7/19 improved today, discussed with pharmacy to try to adjust medication timing.  Nursing order to try to do medications after breakfast   16. HLD:  continue atorvastatin  10mg  nightly.   17. Overactive Bladder- cont Oxybutynin   5mg  nightly -12/13/23 pt now having urinary retention requiring caths. Will stop Oxybutinin, monitor off it for now, if still having retention then would get U/A and consider adding urecholine  -7/21 will add Flomax , check UA  Addendum- SLP consulted for dysphagia   LOS: 6 days A FACE TO FACE EVALUATION WAS PERFORMED  Murray Collier 12/14/2023, 11:12 AM

## 2023-12-14 NOTE — Progress Notes (Signed)
 Occupational Therapy Session Note  Patient Details  Name: Sherri  [name] MRN: [id number] Date of Birth: 1940/07/10  Today's Date: 12/14/2023 OT Individual Time: 1000-1100 OT Individual Time Calculation (min): 60 min    Short Term Goals: Week 1:  OT Short Term Goal 1 (Week 1): patient will demonstrate increased static standing balance to complete LB ADLs OT Short Term Goal 2 (Week 1): patient will demo increase in use of RUE as stabilizer to increase ADL participation OT Short Term Goal 3 (Week 1): patient will demo ability to don pants mod A with AE.  Skilled Therapeutic Interventions/Progress Updates:      Therapy Documentation Precautions:  Precautions Precautions: Fall, Posterior Hip, Other (comment) (PWB RUE- only for walking) Precaution Booklet Issued: No Recall of Precautions/Restrictions: Impaired Precaution/Restrictions Comments: R JP drain, RLE wound vac, MASD Required Braces or Orthoses: Splint/Cast Knee Immobilizer - Right: On at all times Splint/Cast: RUE Restrictions Weight Bearing Restrictions Per Provider Order: Yes RUE Weight Bearing Per Provider Order: Partial weight bearing RUE Partial Weight Bearing Percentage or Pounds: okay to use walker, confirmed with MD/ortho, okay to weight bear right UE as long as R elbow remains extended RLE Weight Bearing Per Provider Order: Weight bearing as tolerated Other Position/Activity Restrictions: posterior hip precautions General: Pt seated in W/C upon OT arrival, agreeable to OT.  Pain:  7/10 pain reported in Rt hip, activity, intermittent rest breaks, distractions provided for pain management, pt reports tolerable to proceed.   ADL: OT providing skilled intervention on LB dressing management for increased independence with LB dressing management. Pt practicing with reacher requiring VC for direction. Pt only practicing threading onto feet d/t increased hip pain this session, able to thread over feet independently, pt  would requiring Mod A overall fr standing to manage over hips.   Exercises: Pt participated in [facility] activity with yellow sponge and tan theraputty for increased dexterity, coordination, edema management and arthritis management in order to complete ADLs. Pt completed tasks with PRN rest breaks.    Other Treatments: OT assisting with answering questions on therapy process and process for D/C. OT went over goals with pt and son with good adherence of education.   Pt seated in W/C at end of session with W/C alarm donned, call light within reach and 4Ps assessed.     Therapy/Group: Individual Therapy  Camie Hoe, OTD, OTR/L 12/14/2023, 12:52 PM

## 2023-12-14 NOTE — Progress Notes (Signed)
 Physical Therapy Session Note  Patient Details  Name: Sherri  [name] MRN: [id number] Date of Birth: [birth year]  Today's Date: 12/14/2023 PT Individual Time: 9095-9054 PT Individual Time Calculation (min): 41 min   Short Term Goals: Week 1:  PT Short Term Goal 1 (Week 1): pt will perform sit to stand with LRAD and CGA PT Short Term Goal 2 (Week 1): pt will perform bed to chair transfer with RW and min A PT Short Term Goal 3 (Week 1): pt will ambuate 5 feet with LRAD and min A  Skilled Therapeutic Interventions/Progress Updates:      Pt presents sitting EOB with NT present. Patient in agreement to therapy treatment, reports 5/10 R hip pain - provided distraction and mobility for pain management. Pt able to recall 1/3 posterior hip precautions - ongoing education provided.   Assisted with donning pants with maxA - needing assist for threading and pulling pants over hips in standing with modA for standing balance. Completed stand pivot transfer with modA from EOB to wheelchair with R hip/knee staying flexed and high probability of buckling.   Transported patient to dayroom gym and setup a seated stepper (kinetron) at wheelchair level. Resistance set to 50cm/sec and she completeed 4x1 minute intervals. Cues needed for full AROM and terminal extension on RLE > LLE. Rest breaks needed for fatigue > pain.   Ended session working on sit<>stands and standing balance. Sit<>Stands from wheelchair to hand rail with modA, progressing to minA, (pt facing hand rail/wall). Completed 1x5 sit<>stands and also completed 2x10 lateral weight shifting in standing. MinA for standing balance.   Pt returned to her room and was left sitting up in wheelchair with her seat belt alarm on, call bell within reach.  *Of note, patient having multiple coughing episodes throughout session, especially after drinking liquids. She also had a coughing spell where she coughed up a pill/medicine - her LPN was made aware and pill  placed on paper towel at the sink.     Therapy Documentation Precautions:  Precautions Precautions: Fall, Posterior Hip, Other (comment) (PWB RUE- only for walking) Precaution Booklet Issued: No Recall of Precautions/Restrictions: Impaired Precaution/Restrictions Comments: R JP drain, RLE wound vac, MASD Required Braces or Orthoses: Splint/Cast Knee Immobilizer - Right: On at all times Splint/Cast: RUE Restrictions Weight Bearing Restrictions Per Provider Order: Yes RUE Weight Bearing Per Provider Order: Partial weight bearing RUE Partial Weight Bearing Percentage or Pounds: okay to use walker, confirmed with MD/ortho, okay to weight bear right UE as long as R elbow remains extended RLE Weight Bearing Per Provider Order: Weight bearing as tolerated Other Position/Activity Restrictions: posterior hip precautions General:      Therapy/Group: Individual Therapy  Ilya Ess P Heidee Audi 12/14/2023, 9:45 AM

## 2023-12-15 DIAGNOSIS — N390 Urinary tract infection, site not specified: Secondary | ICD-10-CM

## 2023-12-15 MED ORDER — NITROFURANTOIN MONOHYD MACRO 100 MG PO CAPS
100.0000 mg | ORAL_CAPSULE | Freq: Two times a day (BID) | ORAL | Status: DC
  Administered 2023-12-15: 100 mg via ORAL
  Filled 2023-12-15 (×2): qty 1

## 2023-12-15 MED ORDER — CEPHALEXIN 250 MG PO CAPS: 250.0000 mg | ORAL_CAPSULE | Freq: Four times a day (QID) | ORAL | Status: DC

## 2023-12-15 MED ORDER — CIPROFLOXACIN HCL 250 MG PO TABS
250.0000 mg | ORAL_TABLET | Freq: Two times a day (BID) | ORAL | Status: DC
  Administered 2023-12-15 – 2023-12-17 (×4): 250 mg via ORAL
  Filled 2023-12-15 (×5): qty 1

## 2023-12-15 NOTE — Progress Notes (Signed)
 Occupational Therapy Weekly Progress Note  Patient Details  Name: Sherri  ALYSA [name] MRN: [id number] Date of Birth: Jan 19, 1941  Beginning of progress report period: December 09, 2023 End of progress report period: December 15, 2023  Today's Date: 12/15/2023 OT Individual Time: 8882-8786 OT Individual Time Calculation (min): 56 min    Patient has met 3 of 3 short term goals.  Patient is demonstrating increased ability to stand, complete transfers, and complete ADLs. Patient does have fluctuating levels of participation and strength leading to decreased levels in ADLs and mobility during some sessions. As a result patients frequency has been decreased to maximize therapeutic participation.   Patient continues to demonstrate the following deficits: muscle weakness, decreased cardiorespiratoy endurance, decreased awareness, decreased problem solving, decreased safety awareness, and decreased memory, and decreased standing balance, decreased postural control, and difficulty maintaining precautions and therefore will continue to benefit from skilled OT intervention to enhance overall performance with BADL.  Patient progressing toward long term goals..  Continue plan of care.  OT Short Term Goals Week 1:  OT Short Term Goal 1 (Week 1): patient will demonstrate increased static standing balance to complete LB ADLs OT Short Term Goal 1 - Progress (Week 1): Met OT Short Term Goal 2 (Week 1): patient will demo increase in use of RUE as stabilizer to increase ADL participation OT Short Term Goal 2 - Progress (Week 1): Met OT Short Term Goal 3 (Week 1): patient will demo ability to don pants mod A with AE. OT Short Term Goal 3 - Progress (Week 1): Met Week 2:  OT Short Term Goal 1 (Week 2): STG=LTG d.t ELOs  Skilled Therapeutic Interventions/Progress Updates:    Patient agreeable to participate in OT session. Reports 7/10 pain level premedicated and repositioned.   Patient participated in skilled OT session  focusing on functional mobility, safety education, UE strengthening, and dynamic balance with ADL participation. Patient completed transfer to and from bed x3 each direction to increase dynamic balance and safety with mod A. Patient demonstrated increased posterior lean during session. Completed UE strengthening within precautions for UE functional use.OT utilized therapeutic use of self to encourage participation in therapy due to decreased motivation and increased frustration due to cognitive deficits. Patient returned to chair, set up for meal, all needs left in reach.  .   Therapy Documentation Precautions:  Precautions Precautions: Fall, Posterior Hip, Other (comment) (PWB RUE- only for walking) Precaution Booklet Issued: No Recall of Precautions/Restrictions: Impaired Precaution/Restrictions Comments: R JP drain, RLE wound vac, MASD Required Braces or Orthoses: Splint/Cast Knee Immobilizer - Right: On at all times Splint/Cast: RUE Restrictions Weight Bearing Restrictions Per Provider Order: Yes RUE Weight Bearing Per Provider Order: Partial weight bearing RUE Partial Weight Bearing Percentage or Pounds: okay to use walker, confirmed with MD/ortho, okay to weight bear right UE as long as R elbow remains extended RLE Weight Bearing Per Provider Order: Weight bearing as tolerated Other Position/Activity Restrictions: posterior hip precautions  Therapy/Group: Individual Therapy  D'mariea L Wilver Tignor 12/15/2023, 7:57 AM

## 2023-12-15 NOTE — Progress Notes (Signed)
 PROGRESS NOTE   Subjective/Complaints:  Patient lying in bed.  No new concerns elicited.  She continues to have urinary retention.  ROS: as per HPI. Denies CP, SOB, abd pain, N/V/D/C, or any other complaints at this time.   + chronic joint pain +nausea, vomiting- resolved + bladder/urine retention   Objective:   DG Elbow 2 Views Right Result Date: 12/14/2023 CLINICAL DATA:  83 year old female with arm in cast. 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 EXAM: RIGHT ELBOW - 2 VIEW COMPARISON:  Postoperative radiographs 11/28/2023 and earlier. FINDINGS: Two views 0730 hours. Overlying cast or splint. Fragmentation of the proximal ulna, olecranon on appears mildly improved, condensed since 11/28/2023. No new or progressive periosteal reaction identified. Stable regional bone mineralization. No new fracture or dislocation identified. No radiopaque foreign body identified. IMPRESSION: Abnormal but mildly improved radiographic appearance of the proximal ulna since 11/28/2023. No new osseous abnormality identified. Electronically Signed   By: VEAR Hurst M.D.   On: 12/14/2023 09:26   Recent Labs    12/14/23 0513  WBC 7.6  HGB 11.4*  HCT 35.0*  PLT 176    Recent Labs    12/14/23 0513  NA 132*  K 3.6  CL 99  CO2 19*  GLUCOSE 75  BUN <5*  CREATININE 0.49  CALCIUM  7.8*    Intake/Output Summary (Last 24 hours) at 12/15/2023 1322 Last data filed at 12/15/2023 0845 Gross per 24 hour  Intake 240 ml  Output 340 ml  Net -100 ml        Physical Exam: Vital Signs Blood pressure 131/62, pulse 96, temperature 98.2 F (36.8 C), resp. rate 16, height 5' (1.524 m), weight 56.1 kg, SpO2 93%.  General: Laying in bed, just got dressed with nursing assistance HEENT: Head is normocephalic, atraumatic, sclera anicteric, oral mucosa moist Neck: Supple without JVD or lymphadenopathy Heart: Reg rate and rhythm. No murmurs  rubs or gallops Chest: CTA bilaterally without wheezes, rales, or rhonchi; no distress Abdomen: Soft, non-tender, non-distended but bladder feels full, bowel sounds positive. Extremities: + trace LE edema Psych: Pt's affect is appropriate. Skin: skin tear to R distal thigh, covered with gauze. Thin skin, multiple skin tears and covered areas, scattered bruising.   Severely bruised/purple R labia Significant MASD and raw buttocks and genitals/perineum- not checked today  Neuro: Awake and alert. Slightly confused  at times, oriented x3, sensation intact in all 4 extremities to LT, hard of hearing  Prior neuro assessment is c/w today's exam 12/15/2023.   MSK:  Arthritic changes noted in her hands RUE with splint and ace wrap R leg has drain with small amount sanguinous drainage in bulb and VAC also has small amount drainage in it RUE- 4-4+/5 R deltoids and can wiggle fingers LUE 4+/5 throughout RLE- HF 2/5;  hard to bend knee, but at least 3/5;DF/PF 4/5 LLE- 5-/5 throughout     Assessment/Plan: 1. Functional deficits which require 3+ hours per day of interdisciplinary therapy in a comprehensive inpatient rehab setting. Physiatrist is providing close team supervision and 24 hour management of active medical problems listed below. Physiatrist and rehab team continue to assess barriers to discharge/monitor patient progress toward  functional and medical goals  Care Tool:  Bathing    Body parts bathed by patient: Right arm, Left upper leg, Left arm, Chest, Abdomen, Front perineal area, Right upper leg, Face   Body parts bathed by helper: Right lower leg, Left lower leg, Buttocks, Front perineal area     Bathing assist Assist Level: Moderate Assistance - Patient 50 - 74%     Upper Body Dressing/Undressing Upper body dressing   What is the patient wearing?: Pull over shirt    Upper body assist Assist Level: Set up assist    Lower Body Dressing/Undressing Lower body dressing       What is the patient wearing?: Pants, Incontinence brief     Lower body assist Assist for lower body dressing: Maximal Assistance - Patient 25 - 49%     Toileting Toileting Toileting Activity did not occur (Clothing management and hygiene only): N/A (no void or bm)  Toileting assist Assist for toileting: Maximal Assistance - Patient 25 - 49%     Transfers Chair/bed transfer  Transfers assist     Chair/bed transfer assist level: Moderate Assistance - Patient 50 - 74%     Locomotion Ambulation   Ambulation assist   Ambulation activity did not occur: Safety/medical concerns          Walk 10 feet activity   Assist  Walk 10 feet activity did not occur: Safety/medical concerns        Walk 50 feet activity   Assist Walk 50 feet with 2 turns activity did not occur: Safety/medical concerns         Walk 150 feet activity   Assist Walk 150 feet activity did not occur: Safety/medical concerns         Walk 10 feet on uneven surface  activity   Assist Walk 10 feet on uneven surfaces activity did not occur: Safety/medical concerns         Wheelchair     Assist Is the patient using a wheelchair?: Yes Type of Wheelchair: Manual    Wheelchair assist level: Dependent - Patient 0%      Wheelchair 50 feet with 2 turns activity    Assist        Assist Level: Dependent - Patient 0%   Wheelchair 150 feet activity     Assist      Assist Level: Dependent - Patient 0%   Blood pressure 131/62, pulse 96, temperature 98.2 F (36.8 C), resp. rate 16, height 5' (1.524 m), weight 56.1 kg, SpO2 93%.  Medical Problem List and Plan: 1. 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 She also had R elbow hardware removal and olecranon excision and triceps advancement 11/28/23.             -patient may not  shower due to VAC             -ELOS/Goals: 12-14 days- Superviison to min A WBAT with  walker RLE-posterior hip precautions Partial weightbearing RUE okay to use walker per Dr. Edna- s/p R elbow hardware removal - Message ortho to confirm wt bearing use with walker- OK for weightbearing but no bending elbow-discussed with Dr. Edna, appreciate assistance.  Okay to remove splint when thermoplastic splint is ready.  Okay to remove this for wound care and hygiene otherwise needs to stay on.  -Continue CIR - Patient was seen by Kemp Hila Cockerham-possible suture removal Monday, plan for right upper extremity splint to  be made today 12/11/2023 -Addendum: social work note indicates step-grandson committed suicide  - Patient seen by orthopedics today 7/21 2.  Antithrombotics: -DVT/anticoagulation:  Mechanical:  Antiembolism stockings, knee (TED hose) Bilateral lower extremities Sequential compression devices, below knee Bilateral lower extremities             -antiplatelet therapy: Aspirin  81 mg held due to bleeding risk  3. Pain Management: Oxycodone  and Tylenol  as needed             - Continue Cymbalta  20 mg daily and Flexeril  10 mg at bedtime 4. Mood/Behavior/Sleep: ego support             -antipsychotic agents: N/A 5. Neuropsych/cognition: This patient is intermittently capable of making decisions on her own behalf.  6. Skin/Wound Care: Routine pressure relief measures             -JP drain-per Ortho will discontinue when output < 10 cc per shift             -Continue negative pressure at 75 mmHg--can be exchanged to Prevena  - MASD-7/15 start Diflucan  200 mg then 100 mg x 6 days -Gerhardt's  -Diffuse ecchymoses- Platelets 330 CTM Routine labs, follow-up with derm/hematology outpatient.  7. Fluids/Electrolytes/Nutrition: Monitor I's and O's, routine labs             -Diet Heart Healthy-- SLP recc nectar thick- pt not agreeable  -Ensure High protein + Zinc  and vitamin C    8. Hypertension/ intermittent mild tachycardia: Continue Coreg  3.125 mg BID,               -7/18-22BP controlled, continue to monitor   Vitals:   12/12/23 1831 12/12/23 1916 12/13/23 0338 12/13/23 0344  BP: 127/71 (!) 111/44 (!) 143/127 130/63   12/13/23 0910 12/13/23 1510 12/13/23 1930 12/13/23 1939  BP: 133/66 (!) 139/102 (!) 137/95 120/64   12/14/23 0518 12/14/23 1453 12/14/23 2205 12/15/23 0444  BP: 127/74 (!) 113/59 (!) 125/52 131/62      9. Rheumatoid arthritis-continue home prednisone  5 mg daily and methotrexate  12.5mg  weekly- methotrexate  just restarted.   10. Microcytic anemia/acute blood loss anemia: monitor for signs of bleeding recheck CBC in a.m   -normal folate & B12, iron study suggestive of AOC, status post 3 units PRBC. -7/21 HGB stable 11.4  11. Severe MASD: will try Diflucan  200 mg x1 and 100 mg daily x 6 days and monitor skin  12. Constipation- senna 8.6mg  BID  - 7/16 reports improvement after multiple bowel movements yesterday  -7/17 LMB 7/15, continue to monitor  -7/18 miralrax daily, PRN sorbitol   -7/22 LBM today, continue to monitor   13. Dysphagia, chronic  -Prior SLP eval re nectar thick, she has been refusing modification/nectar thick liquids  14. Hypokalemia/Hyponateria  -7/17 potassium x2 today, recheck tomorrow 7/18 K+ up to 4.5, NA lower at 128  -Check urine sodium/osmolality due to hyponatremia, asymptomatic,  Cr higher then her baseline at 0.8 today, HGB higher on 7/15 and 7/17 think she is a little fluid depleted, add NS 50ml/hr and monitor response  -Cr 0.57, Na 131, K 3.7. will stop fluids today and recheck Monday. Urine studies not done yet.  -7/21 Sodium up to 132, will add 1 g sodium chloride  twice daily -7/22 recheck labs tomorrow   15. Nausea  -compazine  PRN -7/19 improved today, discussed with pharmacy to try to adjust medication timing.  Nursing order to try to do medications after breakfast   16. HLD: continue atorvastatin  10mg   nightly.   17. Overactive Bladder- cont Oxybutynin  5mg  nightly -12/13/23 pt now having  urinary retention requiring caths. Will stop Oxybutinin, monitor off it for now, if still having retention then would get U/A and consider adding urecholine  -7/21 will add Flomax , check UA  -7/22 volumes appear little better, start Macrobid  18. Suspected UTI  -7/22 Increased retention, potentially a little more confused- suspect UTI Discussed with ID pharm, start macrobid - appreciate assistance. Follow cultures   LOS: 7 days A FACE TO FACE EVALUATION WAS PERFORMED  Murray Collier 12/15/2023, 1:22 PM

## 2023-12-15 NOTE — Progress Notes (Signed)
 Physical Therapy Weekly Progress Note  Patient Details  Name: Sherri [name]  Sherri [name] [name] MRN: [id number] Date of Birth: 10/17/1940  Beginning of progress report period: December 09, 2023 End of progress report period: December 15, 2023  Today's Date: 12/15/2023 PT Individual Time: 9084-9040, 8674-8571 PT Individual Time Calculation (min): 44 min, 63 min  Patient has met 0 of 3 short term goals.  Pt overall level of assist with bed mobility, sit to stand, stand pivot transfer and slide board transfer fluctuates min-max A with pt pain/fatigue/agitation. Pt requires max verbal cues for adherence to posterior hip precautions. Family training scheduled for 7/28 with pt son and daughter. Downgraded goals to supervision/min A for adherence to precautions and consistency. Adjusted pt to 15/7 as anticipating pt fatigue and pain are pt greatest contribution to pt fluctuations in level of assist. D/C pending team conference 7/23.   Patient continues to demonstrate the following deficits muscle weakness and muscle joint tightness, decreased cardiorespiratoy endurance, decreased awareness, decreased problem solving, decreased safety awareness, and decreased memory, and decreased sitting balance, decreased standing balance, decreased balance strategies, and difficulty maintaining precautions and therefore will continue to benefit from skilled PT intervention to increase functional independence with mobility.  Patient not progressing toward long term goals.  See goal revision..  Plan of care revisions: 7/22. Sherri [name]  PT Short Term Goals Week 1:  PT Short Term Goal 1 (Week 1): pt will perform sit to stand with LRAD and CGA PT Short Term Goal 1 - Progress (Week 1): Progressing toward goal PT Short Term Goal 2 (Week 1): pt will perform bed to chair transfer with RW and min A PT Short Term Goal 2 - Progress (Week 1): Progressing toward goal PT Short Term Goal 3 (Week 1): pt will ambuate 5 feet with LRAD and min A PT Short Term Goal 3  - Progress (Week 1): Partly met Week 2:  PT Short Term Goal 1 (Week 2): STG=LTG 2/2 ELOS  Skilled Therapeutic Interventions/Progress Updates:      Treatment Session 1 Pt supine in bed upon arrival. Pt agreeable to therapy. Pt reports R LE pain, not quantified. Therapist provided rest breaks and repositioning as needed.   Pt son present throughout session.   Pt more agitated today. Pt very adamant she wants to D/C home asap.  Education provided regarding POC, and goals. Therapist utilized therapeutic use of self to offer support and encouragement.   Pt performed slide board transfer bed to [facility] with min A. Pt required mod A for WC to car simulator 2/2 pt agitation/pain and poor adherence to therapist recommendations/instructions for safety and posterior hip precautions.   Discussed pt car at home. SUV or pt ne[place] has lower level SUV ([employer] minivan) if needed. Pt son plans to attend session 7/23 at 11:00 am to trial transfer to pt personal car.   Pt seated in WC at end of session with all needs within reach and chair alarm on.    Treatment Session 2  Pt supine in bed upon arrival. Pt agreeable to therapy. Pt denies any pain.   Supine to sit with min A with use of bed features for management of R LE.   Stand pivot transfer bed to WC, WC to car simulator with RW and min-mod A 2/2 retropulsion A, stand pivot transfer bed to [facility] with min A, BSC to WC with mod-max A 2/2 R LE buckling and pt sitting prematurely.   Pt attemtped to walk to bathroom initially however pt opting to  take WC 2/2 pain/fatigue.   Pt coughed up pill during session, notified nurse and speech therapist.   Nurse present to empty J tube drain.   Pt seated in WC with all needs within reach and bed alarm on.      Therapy Documentation Precautions:  Precautions Precautions: Fall, Posterior Hip, Other (comment) (PWB RUE- only for walking) Precaution Booklet Issued: No Recall of Precautions/Restrictions:  Impaired Precaution/Restrictions Comments: R JP drain, RLE wound vac, MASD Required Braces or Orthoses: Splint/Cast Knee Immobilizer - Right: On at all times Splint/Cast: RUE Restrictions Weight Bearing Restrictions Per Provider Order: Yes RUE Weight Bearing Per Provider Order: Partial weight bearing RUE Partial Weight Bearing Percentage or Pounds: okay to use walker, confirmed with MD/ortho, okay to weight bear right UE as long as R elbow remains extended RLE Weight Bearing Per Provider Order: Weight bearing as tolerated Other Position/Activity Restrictions: posterior hip precautions  Therapy/Group: Individual Therapy  Sherri [name] [name], [place], DPT  12/15/2023, 7:45 AM

## 2023-12-15 NOTE — Plan of Care (Signed)
  Problem: RH Swallowing Goal: LTG Patient will participate in dysphagia therapy to increase swallow function with assistance (SLP) Description: LTG:  Patient will participate in dysphagia therapy to increase swallow function with assistance (SLP) Flowsheets (Taken 12/15/2023 0912) LTG: Pt will participate in dysphagia therapy to increase swallow function with assistance of (SLP): Minimal Assistance - Patient > 75%

## 2023-12-15 NOTE — Plan of Care (Signed)
 Downgraded goals to CGA for pt overall safety/consistency  Problem: Sit to Stand Goal: LTG:  Patient will perform sit to stand with assistance level (PT) Description: LTG:  Patient will perform sit to stand with assistance level (PT) Flowsheets (Taken 12/15/2023 1239) LTG: PT will perform sit to stand in preparation for functional mobility with assistance level: Contact Guard/Touching assist   Problem: RH Bed Mobility Goal: LTG Patient will perform bed mobility with assist (PT) Description: LTG: Patient will perform bed mobility with assistance, with/without cues (PT). Flowsheets (Taken 12/15/2023 1239) LTG: Pt will perform bed mobility with assistance level of: Minimal Assistance - Patient > 75%

## 2023-12-16 ENCOUNTER — Inpatient Hospital Stay (HOSPITAL_COMMUNITY)

## 2023-12-16 LAB — BASIC METABOLIC PANEL WITH GFR
Anion gap: 12 (ref 5–15)
BUN: 5 mg/dL — ABNORMAL LOW (ref 8–23)
CO2: 23 mmol/L (ref 22–32)
Calcium: 7.6 mg/dL — ABNORMAL LOW (ref 8.9–10.3)
Chloride: 95 mmol/L — ABNORMAL LOW (ref 98–111)
Creatinine, Ser: 0.62 mg/dL (ref 0.44–1.00)
GFR, Estimated: >60 mL/min (ref >60)
Glucose, Bld: 75 mg/dL (ref 70–99)
Potassium: 3.4 mmol/L — ABNORMAL LOW (ref 3.5–5.1)
Sodium: 130 mmol/L — ABNORMAL LOW (ref 135–145)

## 2023-12-16 LAB — CBC
HCT: 27.4 % — ABNORMAL LOW (ref 36.0–46.0)
Hemoglobin: 9.2 g/dL — ABNORMAL LOW (ref 12.0–15.0)
MCH: 32.1 pg (ref 26.0–34.0)
MCHC: 33.6 g/dL (ref 30.0–36.0)
MCV: 95.5 fL (ref 80.0–100.0)
Platelets: 178 K/uL (ref 150–400)
RBC: 2.87 MIL/uL — ABNORMAL LOW (ref 3.87–5.11)
RDW: 17.9 % — ABNORMAL HIGH (ref 11.5–15.5)
WBC: 10.4 K/uL (ref 4.0–10.5)
nRBC: 0 % (ref 0.0–0.2)

## 2023-12-16 LAB — URINE CULTURE: Culture: >=100000 — AB

## 2023-12-16 MED ORDER — SODIUM CHLORIDE 1 G PO TABS
1.0000 g | ORAL_TABLET | Freq: Three times a day (TID) | ORAL | Status: DC
  Administered 2023-12-16 – 2023-12-22 (×18): 1 g via ORAL
  Filled 2023-12-16 (×18): qty 1

## 2023-12-16 MED ORDER — POTASSIUM CHLORIDE CRYS ER 20 MEQ PO TBCR
30.0000 meq | EXTENDED_RELEASE_TABLET | Freq: Once | ORAL | Status: AC
  Administered 2023-12-16: 30 meq via ORAL
  Filled 2023-12-16: qty 1

## 2023-12-16 NOTE — Progress Notes (Signed)
 Occupational Therapy Session Note  Patient Details  Name: Sherri [name] MRN: [id number] Date of Birth: December 09, 1940  Today's Date: 12/16/2023 OT Individual Time: 9084-8984 OT Individual Time Calculation (min): 60 min    Short Term Goals: Week 1:  OT Short Term Goal 1 (Week 1): patient will demonstrate increased static standing balance to complete LB ADLs OT Short Term Goal 1 - Progress (Week 1): Met OT Short Term Goal 2 (Week 1): patient will demo increase in use of RUE as stabilizer to increase ADL participation OT Short Term Goal 2 - Progress (Week 1): Met OT Short Term Goal 3 (Week 1): patient will demo ability to don pants mod A with AE. OT Short Term Goal 3 - Progress (Week 1): Met Week 2:  OT Short Term Goal 1 (Week 2): STG=LTG d.t ELOs  Skilled Therapeutic Interventions/Progress Updates:  Patient agreeable to participate in OT session. Reports 6/10 pain level, premedicated just prior.   Patient participated in skilled OT session focusing on self care of UE, family education. Patient able to complete cleaning and assist with donning of UE to increase ADL participation and follow multi step cues with safety. Educated on precaution following with teach back. Completed functional mobility 5 ft with attempt to sit with nothing behind due to fatigue and pain. Patient able to complete sit to stands x3 with son education on proper positioning and posterior hip precautions. Patient returned to room with all needs met, alarm in reach.       Therapy Documentation Precautions:  Precautions Precautions: Fall, Posterior Hip, Other (comment) (PWB RUE- only for walking) Precaution Booklet Issued: No Recall of Precautions/Restrictions: Impaired Precaution/Restrictions Comments: R JP drain, RLE wound vac, MASD Required Braces or Orthoses: Splint/Cast Knee Immobilizer - Right: On at all times Splint/Cast: RUE Restrictions Weight Bearing Restrictions Per Provider Order: Yes RUE Weight  Bearing Per Provider Order: Partial weight bearing RUE Partial Weight Bearing Percentage or Pounds: okay to use walker, confirmed with MD/ortho, okay to weight bear right UE as long as R elbow remains extended RLE Weight Bearing Per Provider Order: Weight bearing as tolerated Other Position/Activity Restrictions: posterior hip precautions   Therapy/Group: Individual Therapy  D'mariea L Duval Macleod 12/16/2023, 7:47 AM

## 2023-12-16 NOTE — Plan of Care (Signed)
  Problem: RH Bed to Chair Transfers Goal: LTG Patient will perform bed/chair transfers w/assist (PT) Description: LTG: Patient will perform bed to chair transfers with assistance (PT). Flowsheets (Taken 12/16/2023 1206) LTG: Pt will perform Bed to Chair Transfers with assistance level: Minimal Assistance - Patient > 75%

## 2023-12-16 NOTE — Progress Notes (Signed)
 Speech Language Pathology Daily Session Note  Patient Details  Name: Sherri [name] MRN: [id number] Date of Birth: [birth year]  Today's Date: 12/16/2023 SLP Individual Time: 9265-9194 SLP Individual Time Calculation (min): 31 min  Short Term Goals: Week 1: SLP Short Term Goal 1 (Week 1): STGs=LTGs d/t ELOS  Skilled Therapeutic Interventions: SLP conducted skilled therapy session targeting dysphagia goals. SLP assessed tolerance/safety of current regular/thin liquid diet as well as medication administration from LPN.  Continued ongoing education re: recommendations for Dys2 and NTLs due to frequent and overt coughing episodes after every bite of solids and liquids, though patient continues to decline any diet modification attempts. Patient is able to independently state the risks of this decision upon SLP prompting, suggesting appropriate carryover of education regarding informed decision making. Per SLP instruction, LPN crushed all medium and large pills, leaving small pills whole. Patient tolerated medications in puree with no overt difficulty other than ongoing frequent cough throughout due to pharyngeal residuals. Patient then completed swallowing exercises via effortful swallow and Masako, completing two sets of 10 each, though only able to complete 5 Masako during second set. Patient reports significant leg pain and midway through second set of Masako exercise, states I can't do this anymore, I'm sorry and requested to end session. Missed 14 minutes due to pain. Patient was left in room with call bell in reach and alarm set. SLP will continue to target goals per plan of care.        Pain  8/10 leg pain (right)  Therapy/Group: Individual Therapy  Eathen Budreau, M.A., CCC-SLP  Cantrell Martus A Abdallah Hern 12/16/2023, 8:14 AM

## 2023-12-16 NOTE — Progress Notes (Signed)
 Patient ID: Gizzelle  Sherri [name], female   DOB: 11/18/1940, 84 y.o.   MRN: [id number] Spoke with the patient's spouse via telephone regarding team conference updates. Reviewed that the patient continues to work on swallowing issues but is resistant to recommendations for thickened liquids and can state risks for aspiration. Given chronic dysphagia; patient given pharyngeal exercises but again practicing exercises is inconsistent. Able to swallow large pills when crushed and tolerates small pills.  Reviewed functional level is limited by fatigue and cognitive status is challenged by UTI and pain medication. Downgraded goals to min assist overall and changed to 15/7 therapy schedule.Team has set discharge date of 12/23/23; patient aware and husband is concerned that patient will not be ready by the determined discharge date with her continued confusion and physical assistance need. Reviewed need for 24/7 care givers providing physical assistance; planning to hire caregivers.  Also recommend [facility] PT/OT follow up; [facility] preferred facility, referral order sent in and accepted.  Has DME. Currently with a wound vac and cast/splint on right arm; MD following up on NPWT for discharge.  No other questions or concerns noted at this time.  Continue to follow along to address needs to facilitate preparation for discharge. Sherri [name]

## 2023-12-16 NOTE — Progress Notes (Signed)
 Patient ID: Calli  DELENA Glow, female   DOB: [birth year], 83 y.o.   MRN: [id number]  This SW covering for primary SW, Becky Dupree.  HHPT/OT/aide referral accepted by [facility].   Graeme Jude, MSW, LCSW Office: [phone] Cell: [phone] Fax: [phone]

## 2023-12-16 NOTE — Progress Notes (Signed)
 Physical Therapy Session Note  Patient Details  Name: Sherri [name] MRN: [id number] Date of Birth: [birth year]  Today's Date: 12/16/2023 PT Individual Time: 1104-1200 PT Individual Time Calculation (min): 56 min   Short Term Goals: Week 2:  PT Short Term Goal 1 (Week 2): STG=LTG 2/2 ELOS  Skilled Therapeutic Interventions/Progress Updates:      Pt supine in bed upon arrival. Pt agreeable to therapy. Pt denies any pain.   Pt son present to trail transfer to pt car.   Pt transported dependent in [facility] outside to pt car for time/energy conservation.   Pt unsafe to attempt 2/2 high seat and running board entry. Pt son reports alternative car option is a lexus 360 without running board. Education provided that this would be the best option.   Pt ambulated 10 feet in // bars with +2 for WC follow with step to gait, pt highly favoring L LE, verbal and tactile cues provided for lateral weight shift to R LE.   Pt performed stand pivot transfer WC to car simulator to L with min A. Pt reqiured max A for stand pivot transfer to R 2/2 pt attempting to sit prematurely.   Discussed use of knock off steady as an option for transfers at home for consistency with pt fatigue/pain and rotating caregivers. Handout provided. Therapist demonstrated transfer with use of steady. Pt son plans to discuss with rest of family.   Pt supine in bed at end of session with all needs within reach and nurse changing dressing on R LE.  Therapy Documentation Precautions:  Precautions Precautions: Fall, Posterior Hip, Other (comment) (PWB RUE- only for walking) Precaution Booklet Issued: No Recall of Precautions/Restrictions: Impaired Precaution/Restrictions Comments: R JP drain, RLE wound vac, MASD Required Braces or Orthoses: Splint/Cast Knee Immobilizer - Right: On at all times Splint/Cast: RUE Restrictions Weight Bearing Restrictions Per Provider Order: Yes RUE Weight Bearing Per Provider Order: Partial  weight bearing RUE Partial Weight Bearing Percentage or Pounds: okay to use walker, confirmed with MD/ortho, okay to weight bear right UE as long as R elbow remains extended RLE Weight Bearing Per Provider Order: Weight bearing as tolerated Other Position/Activity Restrictions: posterior hip precautions   Therapy/Group: Individual Therapy  [facility], , DPT  12/16/2023, 1:07 PM

## 2023-12-16 NOTE — Progress Notes (Addendum)
 PROGRESS NOTE   Subjective/Complaints:  Pt has fatigue with therapy, especially after several sessions. She was changed to 15/7 yesterday.  She reports she is sleeping OK at night. She continues to have chronic pain, not frequently getting oxycodone  PRN.   ROS: as per HPI. Denies CP, SOB, abd pain, N/V/D/C, or any other complaints at this time.   + chronic joint pain + nausea, vomiting- resolved + bladder incontinence- continued   Objective:   No results found.  Recent Labs    12/14/23 0513 12/16/23 0428  WBC 7.6 10.4  HGB 11.4* 9.2*  HCT 35.0* 27.4*  PLT 176 178    Recent Labs    12/14/23 0513 12/16/23 0428  NA 132* 130*  K 3.6 3.4*  CL 99 95*  CO2 19* 23  GLUCOSE 75 75  BUN <5* 5*  CREATININE 0.49 0.62  CALCIUM  7.8* 7.6*    Intake/Output Summary (Last 24 hours) at 12/16/2023 1043 Last data filed at 12/16/2023 0653 Gross per 24 hour  Intake 360 ml  Output 40 ml  Net 320 ml        Physical Exam: Vital Signs Blood pressure (!) 131/58, pulse 92, temperature 98.1 F (36.7 C), resp. rate 16, height 5' (1.524 m), weight 56.1 kg, SpO2 92%.  General: Sitting in WC, completed working with therapy few min ago, appears a little fatigued HEENT: Head is normocephalic, atraumatic, sclera anicteric, oral mucosa moist Neck: Supple without JVD or lymphadenopathy Heart: Reg rate and rhythm. No murmurs rubs or gallops Chest: CTA bilaterally without wheezes, rales, or rhonchi; no distress Abdomen: Soft, non-tender, non-distended but bladder feels full, bowel sounds positive. Extremities: + trace LE edema, R leg with Ace wrap Psych: Pt's affect is appropriate. Skin: skin tear to R distal thigh, covered with gauze- appears similar from prior 7/23. Thin skin, multiple skin tears and covered areas, scattered bruising.   Severely bruised/purple R labia Significant MASD and raw buttocks and genitals/perineum- not checked  today  Neuro: Awake and alert. Slightly confused  at times, oriented x3, sensation intact in all 4 extremities to LT, hard of hearing  Prior neuro assessment is c/w today's exam 12/16/2023.   MSK:  Arthritic changes noted in her hands RUE with splint and ace wrap R leg has drain with small amount sanguinous drainage in bulb and VAC also has small amount drainage in it RUE- 4-4+/5 R deltoids and can wiggle fingers LUE 4+/5 throughout RLE- HF 2/5;  hard to bend knee, but at least 3/5;DF/PF 4/5 LLE- 5-/5 throughout   Image from 7/19- appears similar today   Assessment/Plan: 1. Functional deficits which require 3+ hours per day of interdisciplinary therapy in a comprehensive inpatient rehab setting. Physiatrist is providing close team supervision and 24 hour management of active medical problems listed below. Physiatrist and rehab team continue to assess barriers to discharge/monitor patient progress toward functional and medical goals  Care Tool:  Bathing    Body parts bathed by patient: Right arm, Left upper leg, Left arm, Chest, Abdomen, Front perineal area, Right upper leg, Face   Body parts bathed by helper: Right lower leg, Left lower leg, Buttocks, Front perineal area  Bathing assist Assist Level: Moderate Assistance - Patient 50 - 74%     Upper Body Dressing/Undressing Upper body dressing   What is the patient wearing?: Pull over shirt    Upper body assist Assist Level: Set up assist    Lower Body Dressing/Undressing Lower body dressing      What is the patient wearing?: Pants, Incontinence brief     Lower body assist Assist for lower body dressing: Maximal Assistance - Patient 25 - 49%     Toileting Toileting Toileting Activity did not occur (Clothing management and hygiene only): N/A (no void or bm)  Toileting assist Assist for toileting: Maximal Assistance - Patient 25 - 49%     Transfers Chair/bed transfer  Transfers assist     Chair/bed  transfer assist level: Moderate Assistance - Patient 50 - 74%     Locomotion Ambulation   Ambulation assist   Ambulation activity did not occur: Safety/medical concerns          Walk 10 feet activity   Assist  Walk 10 feet activity did not occur: Safety/medical concerns        Walk 50 feet activity   Assist Walk 50 feet with 2 turns activity did not occur: Safety/medical concerns         Walk 150 feet activity   Assist Walk 150 feet activity did not occur: Safety/medical concerns         Walk 10 feet on uneven surface  activity   Assist Walk 10 feet on uneven surfaces activity did not occur: Safety/medical concerns         Wheelchair     Assist Is the patient using a wheelchair?: Yes Type of Wheelchair: Manual    Wheelchair assist level: Dependent - Patient 0%      Wheelchair 50 feet with 2 turns activity    Assist        Assist Level: Dependent - Patient 0%   Wheelchair 150 feet activity     Assist      Assist Level: Dependent - Patient 0%   Blood pressure (!) 131/58, pulse 92, temperature 98.1 F (36.7 C), resp. rate 16, height 5' (1.524 m), weight 56.1 kg, SpO2 92%.  Medical Problem List and Plan: 1. 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 She also had R elbow hardware removal and olecranon excision and triceps advancement 11/28/23.             -patient may not  shower due to VAC             -ELOS/Goals: 12-14 days- Superviison to min A WBAT with walker RLE-posterior hip precautions Partial weightbearing RUE okay to use walker per Dr. Edna- s/p R elbow hardware removal - Message ortho to confirm wt bearing use with walker- OK for weightbearing but no bending elbow-discussed with Dr. Edna, appreciate assistance.  Okay to remove splint when thermoplastic splint is ready.  Okay to remove this for wound care and hygiene otherwise needs to stay  on.  -Continue CIR - Patient was seen by Kemp Hila Cockerham-possible suture removal Monday, plan for right upper extremity splint to be made today 12/11/2023 -Addendum: social work note indicates step-grandson committed suicide  - Patient seen by orthopedics  7/21 -15/7 schedule -Team conference today please see physician documentation under team conference tab, met with team  to discuss problems,progress, and goals. Formulized individual treatment plan based on medical history, underlying problem and  comorbidities.     2.  Antithrombotics: -DVT/anticoagulation:  Mechanical:  Antiembolism stockings, knee (TED hose) Bilateral lower extremities Sequential compression devices, below knee Bilateral lower extremities             -antiplatelet therapy: Aspirin  81 mg held due to bleeding risk  3. Pain Management: Oxycodone  and Tylenol  as needed             - Continue Cymbalta  20 mg daily and Flexeril  10 mg at bedtime 4. Mood/Behavior/Sleep: ego support             -antipsychotic agents: N/A 5. Neuropsych/cognition: This patient is intermittently capable of making decisions on her own behalf.  6. Skin/Wound Care: Routine pressure relief measures             -JP drain-per Ortho will discontinue when output < 10 cc per shift             -Continue negative pressure at 75 mmHg--can be exchanged to Prevena  - MASD-7/15 start Diflucan  200 mg then 100 mg x 6 days -Gerhardt's  -Diffuse ecchymoses- Platelets 330 CTM Routine labs, follow-up with derm/hematology outpatient.  7. Fluids/Electrolytes/Nutrition: Monitor I's and O's, routine labs             -Diet Heart Healthy-- SLP recc nectar thick- pt not agreeable  -Ensure High protein + Zinc  and vitamin C    8. Hypertension/ intermittent mild tachycardia: Continue Coreg  3.125 mg BID,              -7/18-23BP controlled, continue to monitor   Vitals:   12/13/23 0344 12/13/23 0910 12/13/23 1510 12/13/23 1930  BP: 130/63 133/66 (!) 139/102 (!)  137/95   12/13/23 1939 12/14/23 0518 12/14/23 1453 12/14/23 2205  BP: 120/64 127/74 (!) 113/59 (!) 125/52   12/15/23 0444 12/15/23 1448 12/15/23 1940 12/16/23 0556  BP: 131/62 111/72 135/66 (!) 131/58      9. Rheumatoid arthritis-continue home prednisone  5 mg daily and methotrexate  12.5mg  weekly- methotrexate  just restarted.   10. Microcytic anemia/acute blood loss anemia: monitor for signs of bleeding recheck CBC in a.m   -normal folate & B12, iron study suggestive of AOC, status post 3 units PRBC. -7/23 HGB 9.2 today, continue to monitor   11. Severe MASD: will try Diflucan  200 mg x1 and 100 mg daily x 6 days and monitor skin  12. Constipation- senna 8.6mg  BID  - 7/16 reports improvement after multiple bowel movements yesterday  -7/17 LMB 7/15, continue to monitor  -7/18 miralrax daily, PRN sorbitol   -7/23 LBM yesterday  13. Dysphagia, chronic  -Prior SLP eval re nectar thick, she has been refusing modification/nectar thick liquids -SLP has been consulted- she continues to decline interventions  14. Hypokalemia/Hyponateria  -7/17 potassium x2 today, recheck tomorrow 7/18 K+ up to 4.5, NA lower at 128  -Check urine sodium/osmolality due to hyponatremia, asymptomatic,  Cr higher then her baseline at 0.8 today, HGB higher on 7/15 and 7/17 think she is a little fluid depleted, add NS 50ml/hr and monitor response  -Cr 0.57, Na 131, K 3.7. will stop fluids today and recheck Monday. Urine studies not done yet.  -7/21 Sodium up to 132, will add 1 g sodium chloride  twice daily -7/23 Hypokalemia lower at 3.4, give 30meq potassium, NA 130, will increase salt tabs to three times a day, urine labs still not completed- message nursing  15. Nausea  -compazine  PRN -7/19 improved today, discussed with pharmacy to try to adjust medication timing.  Nursing order  to try to do medications after breakfast   16. HLD: continue atorvastatin  10mg  nightly.   17. Overactive Bladder- cont  Oxybutynin  5mg  nightly -12/13/23 pt now having urinary retention requiring caths. Will stop Oxybutinin, monitor off it for now, if still having retention then would get U/A and consider adding urecholine  -7/21 will add Flomax , check UA  -7/22 volumes appear little better, start Macrobid   -7/23 will ask nursing continue to check bladder scans/PVR 18. Suspected UTI  -7/22 Increased retention, potentially a little more confused- suspect UTI Discussed with ID pharm, start macrobid - appreciate assistance. Follow cultures  -7/23 ABX changed to cipro  was growing pseudomonas  Addendum- will check with surgery about DC wound vac. Last not indicates 7 days after discharge  LOS: 8 days A FACE TO FACE EVALUATION WAS PERFORMED  Murray Collier 12/16/2023, 10:43 AM

## 2023-12-17 LAB — CBC WITH DIFFERENTIAL/PLATELET
Abs Immature Granulocytes: 0.11 K/uL — ABNORMAL HIGH (ref 0.00–0.07)
Basophils Absolute: 0 K/uL (ref 0.0–0.1)
Basophils Relative: 0 %
Eosinophils Absolute: 0 K/uL (ref 0.0–0.5)
Eosinophils Relative: 1 %
HCT: 25.8 % — ABNORMAL LOW (ref 36.0–46.0)
Hemoglobin: 8.6 g/dL — ABNORMAL LOW (ref 12.0–15.0)
Immature Granulocytes: 1 %
Lymphocytes Relative: 4 %
Lymphs Abs: 0.3 K/uL — ABNORMAL LOW (ref 0.7–4.0)
MCH: 31.9 pg (ref 26.0–34.0)
MCHC: 33.3 g/dL (ref 30.0–36.0)
MCV: 95.6 fL (ref 80.0–100.0)
Monocytes Absolute: 0.2 K/uL (ref 0.1–1.0)
Monocytes Relative: 3 %
Neutro Abs: 7.2 K/uL (ref 1.7–7.7)
Neutrophils Relative %: 91 %
Platelets: 179 K/uL (ref 150–400)
RBC: 2.7 MIL/uL — ABNORMAL LOW (ref 3.87–5.11)
RDW: 18.2 % — ABNORMAL HIGH (ref 11.5–15.5)
WBC: 7.8 K/uL (ref 4.0–10.5)
nRBC: 0 % (ref 0.0–0.2)

## 2023-12-17 LAB — SODIUM, URINE, RANDOM: Sodium, Ur: 34 mmol/L

## 2023-12-17 LAB — BASIC METABOLIC PANEL WITH GFR
Anion gap: 9 (ref 5–15)
BUN: <5 mg/dL — ABNORMAL LOW (ref 8–23)
CO2: 24 mmol/L (ref 22–32)
Calcium: 7.5 mg/dL — ABNORMAL LOW (ref 8.9–10.3)
Chloride: 95 mmol/L — ABNORMAL LOW (ref 98–111)
Creatinine, Ser: 0.56 mg/dL (ref 0.44–1.00)
GFR, Estimated: >60 mL/min (ref >60)
Glucose, Bld: 79 mg/dL (ref 70–99)
Potassium: 3.7 mmol/L (ref 3.5–5.1)
Sodium: 128 mmol/L — ABNORMAL LOW (ref 135–145)

## 2023-12-17 LAB — OSMOLALITY, URINE: Osmolality, Ur: 139 mosm/kg — ABNORMAL LOW (ref 300–900)

## 2023-12-17 LAB — ALBUMIN: Albumin: 1.9 g/dL — ABNORMAL LOW (ref 3.5–5.0)

## 2023-12-17 LAB — OSMOLALITY: Osmolality: 269 mosm/kg — ABNORMAL LOW (ref 275–295)

## 2023-12-17 LAB — CORTISOL-AM, BLOOD: Cortisol - AM: 14.3 ug/dL (ref 6.7–22.6)

## 2023-12-17 LAB — C-REACTIVE PROTEIN: CRP: 16.5 mg/dL — ABNORMAL HIGH (ref <1.0)

## 2023-12-17 MED ORDER — FUROSEMIDE 10 MG/ML IJ SOLN: 20.0000 mg | Freq: Every day | INTRAMUSCULAR | Status: DC

## 2023-12-17 MED ORDER — ORAL CARE MOUTH RINSE
15.0000 mL | OROMUCOSAL | Status: DC
  Administered 2023-12-17 – 2023-12-23 (×23): 15 mL via OROMUCOSAL

## 2023-12-17 MED ORDER — CEFADROXIL 500 MG PO CAPS
500.0000 mg | ORAL_CAPSULE | Freq: Two times a day (BID) | ORAL | Status: DC
  Administered 2023-12-20 – 2023-12-23 (×6): 500 mg via ORAL
  Filled 2023-12-17 (×6): qty 1

## 2023-12-17 MED ORDER — CIPROFLOXACIN HCL 250 MG PO TABS
250.0000 mg | ORAL_TABLET | Freq: Two times a day (BID) | ORAL | Status: AC
  Administered 2023-12-17 – 2023-12-20 (×6): 250 mg via ORAL
  Filled 2023-12-17 (×7): qty 1

## 2023-12-17 MED ORDER — FUROSEMIDE 20 MG PO TABS
10.0000 mg | ORAL_TABLET | Freq: Every day | ORAL | Status: DC
  Administered 2023-12-17 – 2023-12-23 (×7): 10 mg via ORAL
  Filled 2023-12-17 (×7): qty 1

## 2023-12-17 NOTE — Progress Notes (Signed)
 Speech Language Pathology Daily Session Note  Patient Details  Name: Sherri [name]  KAYANI RAPAPORT MRN: [id number] Date of Birth: 1940-07-31  Today's Date: 12/17/2023 SLP Individual Time: 0730-0743 SLP Individual Time Calculation (min): 13 min  Short Term Goals: Week 1: SLP Short Term Goal 1 (Week 1): STGs=LTGs d/t ELOS  Skilled Therapeutic Interventions: SLP conducted skilled therapy session targeting dysphagia goals. Upon SLP entry, patient receiving medications crushed in puree from RN. Across boluses of puree and of thin liquids, patient continues to exhibit immediate cough after each bite, though cough now sounds a bit more congested than previous, though WBC count remains WFL per most recent labs. Continued education re: signs of pneumonia and recommended patient be alert when experiencing these symptoms and seek medical care immediately due to high risk of aspiration-related sequelae. Patient originally agreeable to eating breakfast tray items, thus SLP assisted with tray setup, though once meal was initiated patient unable to attend due to fatigue. Despite max multimodal cuing, patient fell asleep intermittently with no evidence of initiation to take bites. Patient verbally indicated that she was interested in meal, so placed bite of egg in her mouth. Patient immediately spit it out and said I Don't want it! And fell back asleep. Patient missed 32 minutes d/t fatigue. Patient was left in room with call bell in reach and alarm set. SLP will continue to target goals per plan of care.        Pain Pain Assessment Pain Scale: 0-10 Pain Score: 0-No pain  Therapy/Group: Individual Therapy  Nekisha Mcdiarmid, M.A., CCC-SLP  Colleena Kurtenbach A Keyani Rigdon 12/17/2023, 8:06 AM

## 2023-12-17 NOTE — Patient Care Conference (Cosign Needed)
 Inpatient RehabilitationTeam Conference and Plan of Care Update Date: 12/16/2023   Time: 1204 pm    Patient Name: Sherri [name]      Medical Record Number: [id number]  Date of Birth: 04/17/1941 Sex: Female         Room/Bed: 4W16C/4W16C-01 Payor Info: Payor: [employer]vertising copywriter MEDICARE / Plan: [employer] MEDICARE / Product Type: *No Product type* /    Admit Date/Time:  12/08/2023  4:46 PM  Primary Diagnosis:  History of revision of total replacement of right hip joint  Hospital Problems: Principal Problem:   History of revision of total replacement of right hip joint Active Problems:   Postoperative wound infection   Closed fracture of right elbow    Expected Discharge Date: Expected Discharge Date: 12/23/23  Team Members Present: Physician leading conference: Dr. Murray Collier Social Worker Present: Other (comment) Versa Ronde RN) Nurse Present: Eulalio Falls, RN PT Present: [name], PT OT Present: Other (comment) ALISE Seip , OT) SLP Present: Rosina Downy, SLP     Current Status/Progress Goal Weekly Team Focus  Bowel/Bladder   pt is continent with urgency   Maintain continence   Assist with time toileting q2-4    Swallow/Nutrition/ Hydration   Patient has chronic dysphagia from ACDF surgery completed in 04/2022 with acute on chronic dysphagia from deconditioning. MBS completed prior to arrival on rehab with recommendations for Dys2/NTL, however patient is refusing all diet modifications despite chronic and gross aspiration. Agreeable to pharyngeal strengthening exercises, which may help but will not solve the underlying structural change issue.   min assist  completion of pharyngeal strengthening exercises    ADL's   modA   CG to min A   standing balance, ADL training, functional transfer training.    Mobility   Pt overall level of assist with bed mobility, sit to stand, stand pivot transfer and slide board transfer fluctuates min-max A with pt  pain/fatigue/agitation--and pt attempting to sit prematurely. Pt requires max verbal cues for adherence to posterior hip precautions. Family training scheduled for 7/28 with pt son and daughter. Downgraded goals to supervision/min A for adherence to precautions and consistency. Adjusted pt to 15/7 as anticipating pt fatigue and pain are pt greatest contribution to pt fluctuations in level of assist. Pt strongly favoring L LE 2/2 pain.   supervision/min A  D/C 7/30? follow up HHPT; DME: pt has WC, RW lift chair, lift bed, BSC shower chair build into shower; barriers: baseline dementia, agitation, wound vac, J tube, adherence to precautions, posterior hip precautions, R LE buckling; recommended non emergency medical transport home. provided information for knock off stedy for home 2/2 rotating personal care assist.    Communication                Safety/Cognition/ Behavioral Observations               Pain   Pt c/o R arm and back pain, prn meds given   <3 pain score   Assess qshift and prn    Skin   Generalized bruising and skin tears bilateral arms, wound vac and JP drain   Promote healing  Assess qshift and prn      Discharge Planning:  Home with husband (unable to provide physical care) and hired caregivers 24/7. Active with [facility] services.    Team Discussion: Patient post ORIF right elbow with I+D; hardware removal and Right THA revision. Progress limited by pain, fatigue,agitation, hip precautions, right LE buckling and dementia with wound vac and  fine motor coordination issues. Patient with UTI:MD adjusted medications.  Patient on target to meet rehab goals: Currently patient needs min-max assist with ADLs ; patient cannot conceptualize/cognitive deficiencies. Patient needs min-max assist with mobility. Per St, patient has chronic dysphagia and a high risk for aspiration. Overall goals at discharge are set for supervision/min assist.  *See Care Plan and progress notes for long  and short-term goals.   Revisions to Treatment Plan:  15/7 therapy  [employer]elesitter Family training Downgraded goals [facility] [employer] trials Orthotic adjustment Pharyngeal exercises Neuropsych consult   Teaching Needs: Safety, medications, transfers, toileting, etc.   Current Barriers to Discharge: Decreased caregiver support, Home enviroment access/layout, and Behavior  Possible Resolutions to Barriers: Family Education Home Health      Medical Summary Current Status: UTI, HTN, Anemia,  Hypokalemia, Hyponatremia, Pain  Barriers to Discharge: Complicated Wound;Electrolyte abnormality;Medical stability;Self-care education  Barriers to Discharge Comments: UTI, HTN, Anemia, Hypokalemia, Hyponatremia, Pain, fatigue Possible Resolutions to [employer] Focus: Abx for UTI, 15/7 schedule, monitor BP, Kdur, Salt tabs, Urline labs   Continued Need for Acute Rehabilitation Level of Care: The patient requires daily medical management by a physician with specialized training in physical medicine and rehabilitation for the following reasons: Direction of a multidisciplinary physical rehabilitation program to maximize functional independence : Yes Medical management of patient stability for increased activity during participation in an intensive rehabilitation regime.: Yes Analysis of laboratory values and/or radiology reports with any subsequent need for medication adjustment and/or medical intervention. : Yes   I attest that I was present, lead the team conference, and concur with the assessment and plan of the team.   [place] Gayo 12/16/2023, 1204 pm

## 2023-12-17 NOTE — Progress Notes (Signed)
 Physical Therapy Session Note  Patient Details  Name: Sherri [name] MRN: [id number] Date of Birth: December 23, 1940  Today's Date: 12/17/2023 PT Missed Time: 75 Minutes Missed Time Reason: Patient fatigue  Short Term Goals: Week 2:  PT Short Term Goal 1 (Week 2): STG=LTG 2/2 ELOS  Skilled Therapeutic Interventions/Progress Updates:       Pt fast asleep upon arrival. Pt awoken, however pt refusing therapy. Pt reports increased pain and fatigue keeping her up last night. Pt requesting therapy come back tomorrow.   Pt missed 75 min, will attempt to make up missed minutes as able.   --PT attempted to make up missed minutes at 1445 however pt asleep and refusing upon awakening. Will attempt to make up missed minutes as able.   Therapy Documentation Precautions:  Precautions Precautions: Fall, Posterior Hip, Other (comment) (PWB RUE- only for walking) Precaution Booklet Issued: No Recall of Precautions/Restrictions: Impaired Precaution/Restrictions Comments: R JP drain, RLE wound vac, MASD Required Braces or Orthoses: Splint/Cast Knee Immobilizer - Right: On at all times Splint/Cast: RUE Restrictions Weight Bearing Restrictions Per Provider Order: Yes RUE Weight Bearing Per Provider Order: Partial weight bearing RUE Partial Weight Bearing Percentage or Pounds: okay to use walker, confirmed with MD/ortho, okay to weight bear right UE as long as R elbow remains extended RLE Weight Bearing Per Provider Order: Weight bearing as tolerated Other Position/Activity Restrictions: posterior hip precautions  Therapy/Group: Individual Therapy  [facility], [place], DPT  12/17/2023, 7:50 AM

## 2023-12-17 NOTE — Consult Note (Signed)
 Reason for Consult: Hyponatremia Referring Physician: Murray Collier, MD (PM&R)  HPI:  83 year old woman with past medical history significant for hypertension, rheumatoid arthritis, multiple sclerosis, anxiety, history of urine retention and chronic pain who was admitted to the hospital for visible hardware of the right elbow.  She had earlier been discharged after the management of dislocation of right total hip arthroplasty with a right impacted hip fracture and ORIF of right olecranon fracture.  She underwent irrigation and debridement of the right elbow with hardware removal along with olecranon excision/triceps advancement on 11/28/2023.  Her sodium level on admission was 129 (11/27/2023) and improved to 136 on 12/03/2023 around which it remained until 12/10/2023 dropping back down to 132 and today is 128.  Hyponatremia labs have been checked and are pending.  When seen today, she complains of feeling fatigued and having pain in her right elbow/hip area.  She has had some intermittent nausea overnight without vomiting or diarrhea.  She reports that her appetite is poor while here in the hospital.  She denies any chest pain or shortness of breath.  Review of her records shows she had hyponatremia back in May 2025 during a hospitalization for cellulitis of the right leg with associated sepsis (sodium dropped to as low as 123 and improved to 132 at the time of discharge.  Past Medical History:  Diagnosis Date   Anxiety    takes Valium  daily as needed   Basal cell carcinoma 01/21/1988   left nostril (MOHS), sup-right calf (CX35FU)   Basal cell carcinoma 03/22/1996   right calf (CX35FU)   Basal cell carcinoma 03/31/1995   left wing nose   Bruises easily    d/t meds   Cancer (HCC)    basal cell ca, in situ- uterine    Cataracts, bilateral    removed bilateral   Chronic back pain    Dizziness    r/t to meds   GERD (gastroesophageal reflux disease)    no meds on a regular basis but will take  Tums if needed   Headache(784.0)    r/t neck issues   History of bronchitis 6-9yrs ago   History of colon polyps    benign   HOH (hard of hearing)    wears hearing aids   Hyperlipidemia    takes Atorvastatin  on Mondays and Fridays   Hypertension    Joint pain    Joint swelling    Multiple sclerosis (HCC)    Neuromuscular disorder (HCC)    Dr. FABIENE Crete- [facility], follows [facility]   Nocturia    PONV (postoperative nausea and vomiting)    trouble urinating after surgery in 2014   Postoperative nausea and vomiting 01/11/2019   Rheumatoid arthritis (HCC)    Dr Lorrayne weekly, RA- hands- knees- feet    Rheumatoid arthritis(714.0)    Dr Dolphus takes Xeljanz  daily   Right wrist fracture    Skin cancer    Squamous cell carcinoma of skin 11/02/2001   in situ-right knee (cx59fu)   Squamous cell carcinoma of skin 11/12/2011   in situ-left forearm (CX35FU), in situ-left foot (CX35FU)   Squamous cell carcinoma of skin 01/22/2012   in situ-left lower forearm (txpbx)   Squamous cell carcinoma of skin 11/17/2012   right shin (txpbx)   Squamous cell carcinoma of skin 10/28/2013   in situ-right shoulder (CX35FU)   Squamous cell carcinoma of skin 04/28/2014   well diff-right shin (txpbx)   Squamous cell carcinoma of skin 11/02/2014   in  situ-Left shin (txpbx)   Squamous cell carcinoma of skin 12/15/2014   in situ-Left hand (txpbx), bowens-left side chest (txpbx)   Squamous cell carcinoma of skin 05/09/2015   in situ-left hand (txpbx)    Squamous cell carcinoma of skin 05/07/2016   in situ-left inner shin,ant, in situ-left inner shin, post, in situ-left outer forearm, in situ-right knuckle   Squamous cell carcinoma of skin 03/1302018   in situ-left shoulder (txpbx), in situ-right inner shin (txpbx), in situ-top of left foot (txpbx), KA- left forearm (txpbx)   Squamous cell carcinoma of skin 12/02/2016   in situ-left inner shin (txpbx)   Squamous cell carcinoma of skin  03/04/2017   in situ-left outer sup, shin (txpbx), in situ- right 2nd knuckle finger (txpbx), in situ- Left wrist (txpbx)   Squamous cell carcinoma of skin 04/06/2018   in situ-above left knee inner (txpbx)   Squamous cell carcinoma of skin 04/21/2018   in situ-right top hand (txpbx)   Squamous cell carcinoma of skin 07/08/2018   in situ-right lower inner shin (txpbx)   Squamous cell carcinoma of skin 04/27/2019   in situ- Left neck(CX35FU), In situ- right neck (CX35FU)   Urinary retention    sees Dr.Wrenn about 2 times a yr    Past Surgical History:  Procedure Laterality Date   ABDOMINAL HYSTERECTOMY     1985   ANTERIOR CERVICAL DECOMP/DISCECTOMY FUSION N/A 04/23/2022   Procedure: Anterior Cervical Decompression/Discectomy Fusion - Cervical Seven-Thoracic One;  Surgeon: Louis Shove, MD;  Location: [facility];  Service: Neurosurgery;  Laterality: N/A;  3C   APPENDECTOMY     with TAH   BACK SURGERY     several   BREAST BIOPSY Right    Results were negative   BROW LIFT Bilateral 10/02/2016   Procedure: BILATERAL LOWER LID BLEPHAROPLASTY;  Surgeon: Laurie Loyd Redhead, MD;  Location: [facility];  Service: Plastics;  Laterality: Bilateral;   CARPAL TUNNEL RELEASE Right    cataracts     CERVICAL FUSION     Dr Leeann   CERVICAL FUSION  12/31/2012   Dr Leeann   CERVICAL FUSION  04/2022   CHEST TUBE INSERTION     for traumatic Pneumothorax   COLONOSCOPY  07/16/2010   normal    COLONOSCOPY W/ POLYPECTOMY  1997   negative since; Dr Debrah   ESOPHAGOGASTRODUODENOSCOPY  07/16/2010   normal   eye lid raise     EYE SURGERY Bilateral    cataracts removed - /w IOL   HARDWARE REMOVAL  08/2021   HIP CLOSED REDUCTION Right 11/16/2023   Procedure: CLOSED MANIPULATION, JOINT, HIP;  Surgeon: Ernie Cough, MD;  Location: [facility];  Service: Orthopedics;  Laterality: Right;   HIP CLOSED REDUCTION N/A 11/30/2023   Procedure: CLOSED REDUCTION, HIP;  Surgeon: Fidel Rogue, MD;  Location: [facility];  Service:  Orthopedics;  Laterality: N/A;   INCISION AND DRAINAGE OF WOUND Right 11/28/2023   Procedure: IRRIGATION AND DEBRIDEMENT WOUND; REMOVAL OF HARDWARE;  Surgeon: Edna Toribio LABOR, MD;  Location: [facility];  Service: Orthopedics;  Laterality: Right;  I & D RIGHT ELBOW   JOINT REPLACEMENT     LUMBAR FUSION     Dr Leeann   NASAL SINUS SURGERY     ORIF ELBOW FRACTURE Right 11/20/2023   Procedure: OPEN REDUCTION INTERNAL FIXATION (ORIF) ELBOW/OLECRANON FRACTURE;  Surgeon: [name] Franky SQUIBB, MD;  Location: [facility];  Service: Orthopedics;  Laterality: Right;   POSTERIOR CERVICAL FUSION/FORAMINOTOMY N/A 12/31/2012   Procedure: POSTERIOR  LATERAL CERVICAL FUSION/FORAMINOTOMY LEVEL 1 CERVICAL THREE-FOUR WITH LATERAL MASS SCREWS;  Surgeon: Catalina CHRISTELLA Stains, MD;  Location: [facility];  Service: Neurosurgery;  Laterality: N/A;   PTOSIS REPAIR Bilateral 10/02/2016   Procedure: INTERNAL PTOSIS REPAIR;  Surgeon: Laurie Loyd Redhead, MD;  Location: [facility];  Service: Plastics;  Laterality: Bilateral;   SKIN BIOPSY     THYROID  SURGERY     R lobe removed- 1972, has grown back - CT last done- 2018   TOTAL HIP ARTHROPLASTY  12/22/2011   Procedure: TOTAL HIP ARTHROPLASTY;  Surgeon: Dempsey JINNY Sensor, MD;  Location: [facility];  Service: Orthopedics;  Laterality: Left;   TOTAL HIP ARTHROPLASTY Right 11/12/2023   Procedure: ARTHROPLASTY, HIP, TOTAL, ANTERIOR APPROACH;  Surgeon: Fidel Rogue, MD;  Location: [facility];  Service: Orthopedics;  Laterality: Right;   TOTAL HIP REVISION Right 12/03/2023   Procedure: TOTAL HIP REVISION;  Surgeon: Fidel Rogue, MD;  Location: [facility];  Service: Orthopedics;  Laterality: Right;  Right acetabular revision Anterior approach   TOTAL SHOULDER ARTHROPLASTY     TUBAL LIGATION     UPPER GASTROINTESTINAL ENDOSCOPY  2012   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   WRIST SURGERY Left 09/16/2022   wrist/arm    Family History  Problem Relation Age of Onset   Cancer Mother        pancreatic   Diabetes  Mother    Pancreatic cancer Mother    Heart disease Father        Rheumatic   Cancer Father        ? stomach   Stomach cancer Father    Cancer Sister        stomach   Stomach cancer Sister    Cancer Maternal Aunt        X 4; ? primary   Heart disease Paternal Aunt    Cancer Maternal Grandmother        cervical   Colon cancer Other        Aunts   Multiple sclerosis Daughter    Hypertension Neg Hx    Stroke Neg Hx    Esophageal cancer Neg Hx    Rectal cancer Neg Hx     Social History:  reports that she quit smoking about 42 years ago. Her smoking use included cigarettes. She started smoking about 62 years ago. She has a 50 pack-year smoking history. She has never been exposed to tobacco smoke. She has never used smokeless tobacco. She reports current alcohol  use of about 14.0 standard drinks of alcohol  per week. She reports that she does not use drugs.  Allergies:  Allergies  Allergen Reactions   Darvon [Propoxyphene Hcl] Shortness Of Breath    ? Dose Related ? Lowered respirations greatly   Demerol [Meperidine] Shortness Of Breath and Other (See Comments)    Respiratory Distress   Penicillins Hives and Other (See Comments)    Has patient had a PCN reaction causing immediate rash, facial/tongue/throat swelling, SOB or lightheadedness with hypotension: No SEVERE RASH INVOLVING MUCUS MEMBRANES or SKIN NECROSIS: #  #  #  YES  #  #  #  Has patient had a PCN reaction that required hospitalization No Has patient had a PCN reaction occurring within the last 10 years: No.    Imuran [Azathioprine] Other (See Comments)    Weakness   Sulfa  Antibiotics Nausea Only and Other (See Comments)    States had severe indigestion and heartburn and nausea and had to stop  taking   Arava [Leflunomide] Other (See Comments)    Excessive weight gain   Lipitor [Atorvastatin ] Nausea And Vomiting    Currently taking 2 times weekly - able to tolerate low dose    Vytorin [Ezetimibe-Simvastatin] Nausea  And Vomiting    Medications: I have reviewed the patient's current medications. Scheduled:  vitamin C   1,000 mg Oral Daily   atorvastatin   10 mg Oral QPM   carvedilol   3.125 mg Oral BID WC   cholecalciferol   1,000 Units Oral q AM   ciprofloxacin   250 mg Oral BID   cyclobenzaprine   10 mg Oral QHS   DULoxetine   20 mg Oral QHS   Gerhardt's butt cream   Topical TID   methotrexate   12.5 mg Oral Q Wed   mouth rinse  15 mL Mouth Rinse 4 times per day   pantoprazole   40 mg Oral BID AC   polyethylene glycol  17 g Oral Daily   predniSONE   5 mg Oral Q breakfast   senna  1 tablet Oral BID   sodium chloride  flush  10 mL Intravenous Q12H   sodium chloride   1 g Oral TID WC   tamsulosin   0.4 mg Oral QPC supper   zinc  sulfate (50mg  elemental zinc )  220 mg Oral Q supper       Latest Ref Rng & Units 12/17/2023    5:13 AM 12/16/2023    4:28 AM 12/14/2023    5:13 AM  BMP  Glucose 70 - 99 mg/dL 79  75  75   BUN 8 - 23 mg/dL <5  5  <5   Creatinine 0.44 - 1.00 mg/dL 9.43  9.37  9.50   Sodium 135 - 145 mmol/L 128  130  132   Potassium 3.5 - 5.1 mmol/L 3.7  3.4  3.6   Chloride 98 - 111 mmol/L 95  95  99   CO2 22 - 32 mmol/L 24  23  19    Calcium  8.9 - 10.3 mg/dL 7.5  7.6  7.8       Latest Ref Rng & Units 12/17/2023    5:13 AM 12/16/2023    4:28 AM 12/14/2023    5:13 AM  CBC  WBC 4.0 - 10.5 K/uL 7.8  10.4  7.6   Hemoglobin 12.0 - 15.0 g/dL 8.6  9.2  88.5   Hematocrit 36.0 - 46.0 % 25.8  27.4  35.0   Platelets 150 - 400 K/uL 179  178  176    Urinalysis    Component Value Date/Time   COLORURINE YELLOW 12/14/2023 1626   APPEARANCEUR CLEAR 12/14/2023 1626   LABSPEC 1.010 12/14/2023 1626   PHURINE 7.0 12/14/2023 1626   GLUCOSEU NEGATIVE 12/14/2023 1626   GLUCOSEU NEGATIVE 04/10/2020 0914   HGBUR NEGATIVE 12/14/2023 1626   HGBUR trace-lysed 05/29/2010 0750   BILIRUBINUR NEGATIVE 12/14/2023 1626   KETONESUR NEGATIVE 12/14/2023 1626   PROTEINUR NEGATIVE 12/14/2023 1626   UROBILINOGEN 0.2  04/10/2020 0914   NITRITE NEGATIVE 12/14/2023 1626   LEUKOCYTESUR MODERATE (A) 12/14/2023 1626    DG HIP UNILAT WITH PELVIS 2-3 VIEWS RIGHT Result Date: 12/16/2023 CLINICAL DATA:  Status post revision of right hip arthroplasty. EXAM: DG HIP (WITH OR WITHOUT PELVIS) 2-3V RIGHT COMPARISON:  Preoperative radiographs FINDINGS: Revision right hip arthroplasty in expected alignment. No periprosthetic lucency or fracture. Recent postsurgical change includes air and edema in the soft tissues. Left hip arthroplasty is partially included, intact were visualized. IMPRESSION: Revision right hip arthroplasty without immediate postoperative  complication. Electronically Signed   By: Andrea Gasman M.D.   On: 12/16/2023 13:37    Review of Systems  Constitutional:  Positive for activity change, appetite change and fatigue. Negative for chills and fever.  HENT:  Negative for sore throat and trouble swallowing.   Eyes:  Negative for photophobia and visual disturbance.  Respiratory:  Positive for cough. Negative for chest tightness and shortness of breath.   Cardiovascular:  Negative for chest pain and leg swelling.  Gastrointestinal:  Positive for nausea. Negative for abdominal pain, diarrhea and vomiting.  Genitourinary:  Negative for decreased urine volume, dysuria, frequency and hematuria.  Musculoskeletal:  Positive for arthralgias, back pain and myalgias.  Skin:  Positive for wound.       Right leg  Neurological:  Positive for weakness. Negative for dizziness, light-headedness and headaches.   Blood pressure 122/74, pulse 94, temperature 98.4 F (36.9 C), resp. rate 19, height 5' (1.524 m), weight 56.1 kg, SpO2 90%. Physical Exam Vitals reviewed.  Constitutional:      Appearance: She is normal weight. She is ill-appearing.  HENT:     Head: Normocephalic and atraumatic.     Right Ear: External ear normal.     Left Ear: External ear normal.     Nose: Nose normal.     Mouth/Throat:     Mouth:  Mucous membranes are dry.     Pharynx: Oropharynx is clear.  Eyes:     Extraocular Movements: Extraocular movements intact.     Conjunctiva/sclera: Conjunctivae normal.  Cardiovascular:     Rate and Rhythm: Normal rate and regular rhythm.     Pulses: Normal pulses.     Heart sounds: Normal heart sounds.  Pulmonary:     Effort: Pulmonary effort is normal.     Breath sounds: Normal breath sounds. No wheezing or rales.  Abdominal:     General: Abdomen is flat. Bowel sounds are normal.     Palpations: Abdomen is soft.  Musculoskeletal:     Cervical back: Normal range of motion and neck supple.     Right lower leg: No edema.     Left lower leg: No edema.     Comments: Right leg in loose gauze dressing with scab over healing thigh ulcer.  Right arm in Ace wrap.  Skin:    General: Skin is warm and dry.     Findings: Bruising present.  Neurological:     Mental Status: She is alert.     Assessment/Plan: 1.  Hyponatremia: She appears to be euvolemic to possibly slightly hypovolemic on physical exam.  Urine osmolality and serum osmolality are pending however urinalysis from 3 days ago showed a specific gravity of 1010 indicating urine osmolality about 300-350.  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  I will begin fluid restriction and start her on low-dose loop diuretic.  No indication for tolvaptan or hypertonic saline at this time.  Awaiting evaluation for adrenal insufficiency and hypothyroidism. 2.  Hypocalcemia: Her albumin  corrected calcium  is within normal range.  Will verify with an ionized calcium  level. 3.  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  Rheumatoid arthritis: Methotrexate  and prednisone   Gordy MARLA Blanch 12/17/2023, 10:57 AM

## 2023-12-17 NOTE — Progress Notes (Signed)
 Occupational Therapy Session Note  Patient Details  Name: Sherri [name] MRN: [id number] Date of Birth: [birth year]  Today's Date: 12/17/2023 OT Individual Time: 0815-0829 OT Individual Time Calculation (min): 14 min  and Today's Date: 12/17/2023 OT Missed Time: 16 Minutes Missed Time Reason: Pain   Short Term Goals: Week 1:  OT Short Term Goal 1 (Week 1): patient will demonstrate increased static standing balance to complete LB ADLs OT Short Term Goal 1 - Progress (Week 1): Met OT Short Term Goal 2 (Week 1): patient will demo increase in use of RUE as stabilizer to increase ADL participation OT Short Term Goal 2 - Progress (Week 1): Met OT Short Term Goal 3 (Week 1): patient will demo ability to don pants mod A with AE. OT Short Term Goal 3 - Progress (Week 1): Met Week 2:  OT Short Term Goal 1 (Week 2): STG=LTG d.t ELOs   Skilled Therapeutic Interventions/Progress Updates:    Pt bed level at time of session, nursing care present for dressing change along drain and skin tear. Pt rolling L/R with MIN/CGA this date with cues for technique. After dressing change, OT trying extensively to engage pt with the pt only grunting and saying pain. When asked a number, muttered ten. And had difficulty keeping eyes open. Asked to get OOB, brush teeth, morning ADL, etc and pt stating no. Missed 16 mins of OT and will attempt to make up as able.   Therapy Documentation Precautions:  Precautions Precautions: Fall, Posterior Hip, Other (comment) (PWB RUE- only for walking) Precaution Booklet Issued: No Recall of Precautions/Restrictions: Impaired Precaution/Restrictions Comments: R JP drain, RLE wound vac, MASD Required Braces or Orthoses: Splint/Cast Knee Immobilizer - Right: On at all times Splint/Cast: RUE Restrictions Weight Bearing Restrictions Per Provider Order: Yes RUE Weight Bearing Per Provider Order: Partial weight bearing RUE Partial Weight Bearing Percentage or Pounds: okay  to use walker, confirmed with MD/ortho, okay to weight bear right UE as long as R elbow remains extended RLE Weight Bearing Per Provider Order: Weight bearing as tolerated Other Position/Activity Restrictions: posterior hip precautions    Therapy/Group: Individual Therapy  Sherri [name] 12/17/2023, 7:22 AM

## 2023-12-17 NOTE — Progress Notes (Addendum)
 PROGRESS NOTE   Subjective/Complaints:  Continues to have fatigue with therapy.  Sodium lower today to 128.  She reports he pain is doing ok' this AM.   ROS: as per HPI. Denies CP, SOB, abd pain, N/V/D/C, or any other complaints at this time.   + chronic joint pain + bladder incontinence/retention- continued   Objective:   DG HIP UNILAT WITH PELVIS 2-3 VIEWS RIGHT Result Date: 12/16/2023 CLINICAL DATA:  Status post revision of right hip arthroplasty. EXAM: DG HIP (WITH OR WITHOUT PELVIS) 2-3V RIGHT COMPARISON:  Preoperative radiographs FINDINGS: Revision right hip arthroplasty in expected alignment. No periprosthetic lucency or fracture. Recent postsurgical change includes air and edema in the soft tissues. Left hip arthroplasty is partially included, intact were visualized. IMPRESSION: Revision right hip arthroplasty without immediate postoperative complication. Electronically Signed   By: Andrea Gasman M.D.   On: 12/16/2023 13:37    Recent Labs    12/16/23 0428 12/17/23 0513  WBC 10.4 7.8  HGB 9.2* 8.6*  HCT 27.4* 25.8*  PLT 178 179    Recent Labs    12/16/23 0428 12/17/23 0513  NA 130* 128*  K 3.4* 3.7  CL 95* 95*  CO2 23 24  GLUCOSE 75 79  BUN 5* <5*  CREATININE 0.62 0.56  CALCIUM  7.6* 7.5*    Intake/Output Summary (Last 24 hours) at 12/17/2023 0935 Last data filed at 12/17/2023 0600 Gross per 24 hour  Intake 440 ml  Output 520 ml  Net -80 ml        Physical Exam: Vital Signs Blood pressure 122/74, pulse 94, temperature 98.4 F (36.9 C), resp. rate 19, height 5' (1.524 m), weight 56.1 kg, SpO2 90%.  General: Sitting in WC, completed working with therapy few min ago, appears a little fatigued HEENT: Head is normocephalic, atraumatic, sclera anicteric, oral a little dry  Neck: Supple without JVD or lymphadenopathy Heart: Reg rate and rhythm. No murmurs rubs or gallops Chest: CTA bilaterally  without wheezes, rales, or rhonchi; no distress Abdomen: Soft, non-tender, non-distended but bladder feels full, bowel sounds positive. Extremities: + trace LE edema, R leg with Ace wrap Psych: Pt's affect is appropriate. Skin: skin tear to R distal thigh, covered with gauze- appears similar from prior 7/23. Thin skin, multiple skin tears and covered areas, scattered bruising.   Severely bruised/purple R labia Significant MASD and raw buttocks and genitals/perineum- not checked today  Neuro: Drowsy this AM, Slightly confused  at times, oriented  to person place, year, did not provide month, sensation intact in all 4 extremities to LT, hard of hearing  Prior neuro assessment is c/w today's exam 12/17/2023.   MSK:  Arthritic changes noted in her hands RUE with splint and ace wrap R leg has drain with small amount sanguinous drainage in bulb and VAC also has small amount drainage in it RUE- 4-4+/5 R deltoids and can wiggle fingers LUE 4+/5 throughout RLE- HF 2/5;  hard to bend knee, but at least 3/5;DF/PF 4/5 LLE- 5-/5 throughout   Image from 7/19- appears similar today   Drain site 7/24  Assessment/Plan: 1. Functional deficits which require 3+ hours per day of interdisciplinary therapy in a comprehensive  inpatient rehab setting. Physiatrist is providing close team supervision and 24 hour management of active medical problems listed below. Physiatrist and rehab team continue to assess barriers to discharge/monitor patient progress toward functional and medical goals  Care Tool:  Bathing    Body parts bathed by patient: Right arm, Left upper leg, Left arm, Chest, Abdomen, Front perineal area, Right upper leg, Face   Body parts bathed by helper: Right lower leg, Left lower leg, Buttocks, Front perineal area     Bathing assist Assist Level: Moderate Assistance - Patient 50 - 74%     Upper Body Dressing/Undressing Upper body dressing   What is the patient wearing?: Pull over  shirt    Upper body assist Assist Level: Set up assist    Lower Body Dressing/Undressing Lower body dressing      What is the patient wearing?: Pants, Incontinence brief     Lower body assist Assist for lower body dressing: Maximal Assistance - Patient 25 - 49%     Toileting Toileting Toileting Activity did not occur (Clothing management and hygiene only): N/A (no void or bm)  Toileting assist Assist for toileting: Maximal Assistance - Patient 25 - 49%     Transfers Chair/bed transfer  Transfers assist     Chair/bed transfer assist level: Moderate Assistance - Patient 50 - 74%     Locomotion Ambulation   Ambulation assist   Ambulation activity did not occur: Safety/medical concerns  Assist level: Contact Guard/Touching assist Assistive device: Parallel bars Max distance: 10   Walk 10 feet activity   Assist  Walk 10 feet activity did not occur: Safety/medical concerns  Assist level: Contact Guard/Touching assist Assistive device: Parallel bars   Walk 50 feet activity   Assist Walk 50 feet with 2 turns activity did not occur: Safety/medical concerns         Walk 150 feet activity   Assist Walk 150 feet activity did not occur: Safety/medical concerns         Walk 10 feet on uneven surface  activity   Assist Walk 10 feet on uneven surfaces activity did not occur: Safety/medical concerns         Wheelchair     Assist Is the patient using a wheelchair?: Yes Type of Wheelchair: Manual    Wheelchair assist level: Dependent - Patient 0%      Wheelchair 50 feet with 2 turns activity    Assist        Assist Level: Dependent - Patient 0%   Wheelchair 150 feet activity     Assist      Assist Level: Dependent - Patient 0%   Blood pressure 122/74, pulse 94, temperature 98.4 F (36.9 C), resp. rate 19, height 5' (1.524 m), weight 56.1 kg, SpO2 90%.  Medical Problem List and Plan: 1. 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 She also had R elbow hardware removal and olecranon excision and triceps advancement 11/28/23.             -patient may not  shower due to VAC             -ELOS/Goals: 12-14 days- Superviison to min A WBAT with walker RLE-posterior hip precautions Partial weightbearing RUE okay to use walker per Dr. Edna- s/p R elbow hardware removal - Message ortho to confirm wt bearing use with walker- OK for weightbearing but no bending elbow-discussed with Dr. Edna, appreciate assistance.  Okay to  remove splint when thermoplastic splint is ready.  Okay to remove this for wound care and hygiene otherwise needs to stay on.  -Continue CIR - Patient was seen by Kemp Hila Cockerham-possible suture removal Monday, plan for right upper extremity splint to be made today 12/11/2023 -Addendum: social work note indicates step-grandson committed suicide  - Patient seen by orthopedics  7/21 -15/7 schedule -Expected 7/30   -Discussed surgery plan to remove vac friday  2.  Antithrombotics: -DVT/anticoagulation:  Mechanical:  Antiembolism stockings, knee (TED hose) Bilateral lower extremities Sequential compression devices, below knee Bilateral lower extremities             -antiplatelet therapy: Aspirin  81 mg held due to bleeding risk  3. Pain Management: Oxycodone  and Tylenol  as needed             - Continue Cymbalta  20 mg daily and Flexeril  10 mg at bedtime 4. Mood/Behavior/Sleep: ego support             -antipsychotic agents: N/A 5. Neuropsych/cognition: This patient is intermittently capable of making decisions on her own behalf.  6. Skin/Wound Care: Routine pressure relief measures             -JP drain-per Ortho will discontinue when output < 10 cc per shift             -Continue negative pressure at 75 mmHg--can be exchanged to Prevena  - MASD-7/15 start Diflucan  200 mg then 100 mg x 6 days -Gerhardt's  -Diffuse ecchymoses-  Platelets 330 CTM Routine labs, follow-up with derm/hematology outpatient. -ID pharm recommending 4 weeks abx from time of OR for elbow 8/2, finish cipro  for UTI then switch back to cefadroxil    7. Fluids/Electrolytes/Nutrition: Monitor I's and O's, routine labs             -Diet Heart Healthy-- SLP recc nectar thick- pt not agreeable  -Ensure High protein + Zinc  and vitamin C    8. Hypertension/ intermittent mild tachycardia: Continue Coreg  3.125 mg BID,              -7/18-24BP controlled, continue to monitor   Vitals:   12/13/23 1930 12/13/23 1939 12/14/23 0518 12/14/23 1453  BP: (!) 137/95 120/64 127/74 (!) 113/59   12/14/23 2205 12/15/23 0444 12/15/23 1448 12/15/23 1940  BP: (!) 125/52 131/62 111/72 135/66   12/16/23 0556 12/16/23 1333 12/16/23 2041 12/17/23 0420  BP: (!) 131/58 114/60 128/67 122/74      9. Rheumatoid arthritis-continue home prednisone  5 mg daily and methotrexate  12.5mg  weekly- methotrexate  just restarted.   10. Microcytic anemia/acute blood loss anemia: monitor for signs of bleeding recheck CBC in a.m   -normal folate & B12, iron study suggestive of AOC, status post 3 units PRBC. 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 Constipation- senna 8.6mg  BID  - 7/16 reports improvement after multiple bowel movements yesterday  -7/17 LMB 7/15, continue to monitor  -7/18 miralrax daily, PRN sorbitol   -7/23 LBM yesterday  13. Dysphagia, chronic  -Prior SLP eval re nectar thick, she has been refusing modification/nectar thick liquids -SLP has been consulted- she continues to decline interventions  14. Hypokalemia/Hyponateria  -7/17 potassium x2 today, recheck tomorrow 7/18 K+ up to 4.5, NA lower at 128  -Check urine sodium/osmolality due to hyponatremia, asymptomatic,  Cr higher then her baseline at 0.8 today, HGB higher on 7/15 and  7/17 think she is a little fluid  depleted, add NS 50ml/hr and monitor response  -Cr 0.57, Na 131, K 3.7. will stop fluids today and recheck Monday. Urine studies not done yet.  -7/21 Sodium up to 132, will add 1 g sodium chloride  twice daily -7/23 Hypokalemia lower at 3.4, give 30meq potassium, NA 130, will increase salt tabs to three times a day, urine labs still not completed- message nursing -7/24 K+ stable, NA down to 128, check cortisol, urine labs pending, Serum osmolality  -consult nephrology   15. Nausea  -compazine  PRN -7/19 improved today, discussed with pharmacy to try to adjust medication timing.  Nursing order to try to do medications after breakfast   16. HLD: continue atorvastatin  10mg  nightly.   17. Overactive Bladder- cont Oxybutynin  5mg  nightly -12/13/23 pt now having urinary retention requiring caths. Will stop Oxybutinin, monitor off it for now, if still having retention then would get U/A and consider adding urecholine  -7/21 will add Flomax , check UA  -7/22 volumes appear little better, start Macrobid   -7/23 will ask nursing continue to check bladder scans/PVR 18. Suspected UTI  -7/22 Increased retention, potentially a little more confused- suspect UTI Discussed with ID pharm, start macrobid - appreciate assistance. Follow cultures  -7/23 ABX changed to cipro  was growing pseudomonas, cultures indicate it is sensitive   Sed rate/CRP  LOS: 9 days A FACE TO FACE EVALUATION WAS PERFORMED  Murray Collier 12/17/2023, 9:35 AM

## 2023-12-18 LAB — RENAL FUNCTION PANEL
Albumin: 1.9 g/dL — ABNORMAL LOW (ref 3.5–5.0)
Anion gap: 9 (ref 5–15)
BUN: <5 mg/dL — ABNORMAL LOW (ref 8–23)
CO2: 24 mmol/L (ref 22–32)
Calcium: 7.4 mg/dL — ABNORMAL LOW (ref 8.9–10.3)
Chloride: 98 mmol/L (ref 98–111)
Creatinine, Ser: 0.52 mg/dL (ref 0.44–1.00)
GFR, Estimated: >60 mL/min (ref >60)
Glucose, Bld: 81 mg/dL (ref 70–99)
Phosphorus: 2.2 mg/dL — ABNORMAL LOW (ref 2.5–4.6)
Potassium: 3.7 mmol/L (ref 3.5–5.1)
Sodium: 131 mmol/L — ABNORMAL LOW (ref 135–145)

## 2023-12-18 MED ORDER — BETHANECHOL CHLORIDE 10 MG PO TABS
5.0000 mg | ORAL_TABLET | Freq: Three times a day (TID) | ORAL | Status: DC
  Administered 2023-12-18 – 2023-12-22 (×11): 5 mg via ORAL
  Filled 2023-12-18 (×11): qty 1

## 2023-12-18 MED ORDER — POLYETHYLENE GLYCOL 3350 17 G PO PACK
17.0000 g | PACK | Freq: Two times a day (BID) | ORAL | Status: DC
  Administered 2023-12-18 – 2023-12-22 (×9): 17 g via ORAL
  Filled 2023-12-18 (×10): qty 1

## 2023-12-18 MED ORDER — ENSURE PLUS HIGH PROTEIN PO LIQD
237.0000 mL | Freq: Two times a day (BID) | ORAL | Status: DC
  Administered 2023-12-18: 237 mL via ORAL

## 2023-12-18 NOTE — Progress Notes (Signed)
 Physical Therapy Session Note  Patient Details  Name: Sherri [name]  RHODA WALDVOGEL MRN: [id number] Date of Birth: 04/14/1941  Today's Date: 12/18/2023 PT Individual Time: 9195-9151 PT Individual Time Calculation (min): 44 min   Short Term Goals: Week 2:  PT Short Term Goal 1 (Week 2): STG=LTG 2/2 ELOS  Skilled Therapeutic Interventions/Progress Updates: Patient sitting in recliner on entrance to room. Patient alert and agreeable to PT session.   Patient reported 7/10 pain in R LE towards end of session (nsg notified to provide medication per pt request). Pt oriented x2 (name and year) but disoriented to place and situation and required assistance recalling. Pt requested to be transported outside to see the ground/building that pt was in. PTA threaded personal pants through B LE's (with wound vac through R leg as well) and pt performed sit<>stand in STED[place] with light minA to donn pants maxA. Pt requested ice water . Pt drank from straw and started coughing with cues to take smaller/slow slips. Pt transported outside to [facility] and frequently stated being cold (warm blanket provided on reentry to building). Pt outside and performed 1 x sit<>stand with minA to RW and stood for less than 15 seconds before needing rest break due to pain R LE. Pt performed x 10 LAQ with 5 second extension hold in available ROM (slightly flexed). Pt transported back inside in [facility] and encouraged to stay in [facility] to increase tolerance to OOB activities as nsg provided medication (pt stated desire to return to bed, but agreed to stay in chair if pain medication was given).   Patient sitting in WC at end of session with brakes locked, belt alarm set, and all needs within reach.      Therapy Documentation Precautions:  Precautions Precautions: Fall, Posterior Hip, Other (comment) (PWB RUE- only for walking) Precaution Booklet Issued: No Recall of Precautions/Restrictions: Impaired Precaution/Restrictions Comments: R JP drain, RLE wound vac,  MASD Required Braces or Orthoses: Splint/Cast Knee Immobilizer - Right: On at all times Splint/Cast: RUE Restrictions Weight Bearing Restrictions Per Provider Order: Yes RUE Weight Bearing Per Provider Order: Partial weight bearing RUE Partial Weight Bearing Percentage or Pounds: okay to use walker, confirmed with MD/ortho, okay to weight bear right UE as long as R elbow remains extended RLE Weight Bearing Per Provider Order: Weight bearing as tolerated Other Position/Activity Restrictions: posterior hip precautions  Therapy/Group: Individual Therapy  Danea Manter PTA 12/18/2023, 12:29 PM

## 2023-12-18 NOTE — Progress Notes (Signed)
 PROGRESS NOTE   Subjective/Complaints:  Patient sitting up in chair today.  She is asking about when her family is going to come visit her.  Continues to have continued confusion at times.  Reports continued fatigue.  ROS: as per HPI. Denies CP, SOB, abd pain, N/V/D/C, or any other complaints at this time.   + chronic joint pain + bladder incontinence/retention- continued + Fatigue + Right leg pain   Objective:   No results found.   Recent Labs    12/16/23 0428 12/17/23 0513  WBC 10.4 7.8  HGB 9.2* 8.6*  HCT 27.4* 25.8*  PLT 178 179    Recent Labs    12/17/23 0513 12/18/23 0521  NA 128* 131*  K 3.7 3.7  CL 95* 98  CO2 24 24  GLUCOSE 79 81  BUN <5* <5*  CREATININE 0.56 0.52  CALCIUM  7.5* 7.4*    Intake/Output Summary (Last 24 hours) at 12/18/2023 1615 Last data filed at 12/18/2023 1600 Gross per 24 hour  Intake 340 ml  Output 1020 ml  Net -680 ml        Physical Exam: Vital Signs Blood pressure 105/62, pulse 96, temperature 98.2 F (36.8 C), temperature source Oral, resp. rate 15, height 5' (1.524 m), weight 47.6 kg, SpO2 96%.  General: Sitting in WC, NAD HEENT: Head is normocephalic, atraumatic, sclera anicteric, oral a little dry  Neck: Supple without JVD or lymphadenopathy Heart: Reg rate and rhythm. No murmurs rubs or gallops Chest: CTA bilaterally without wheezes, rales, or rhonchi; no distress Abdomen: Soft, non-tender, non-distended but bladder feels full, bowel sounds positive. Extremities: No significant lower extremity edema R leg with Ace wrap Psych: Patient appears little frustrated Skin: skin tear to R distal thigh, covered with gauze. Thin skin, multiple skin tears and covered areas, scattered bruising.   Neuro: Awake and alert, confused  at times, oriented  to person place, year, not month or day (she says August 30) , sensation intact in all 4 extremities to LT, hard of  hearing  Prior neuro assessment is c/w today's exam 12/18/2023.   MSK:  Arthritic changes noted in her hands RUE with splint and ace wrap R leg has drain with small amount sanguinous drainage in bulb and VAC also has small amount drainage in it Lifts bilateral upper extremities to gravity RLE- HF 3/5;  KE 4-/5, DF/PF 4/5 LLE- 5-/5 throughout   Image from 7/19   Drain site 7/24  Assessment/Plan: 1. Functional deficits which require 3+ hours per day of interdisciplinary therapy in a comprehensive inpatient rehab setting. Physiatrist is providing close team supervision and 24 hour management of active medical problems listed below. Physiatrist and rehab team continue to assess barriers to discharge/monitor patient progress toward functional and medical goals  Care Tool:  Bathing    Body parts bathed by patient: Right arm, Left upper leg, Left arm, Chest, Abdomen, Front perineal area, Right upper leg, Face   Body parts bathed by helper: Right lower leg, Left lower leg, Buttocks, Front perineal area     Bathing assist Assist Level: Moderate Assistance - Patient 50 - 74%     Upper Body Dressing/Undressing Upper body dressing  What is the patient wearing?: Pull over shirt    Upper body assist Assist Level: Set up assist    Lower Body Dressing/Undressing Lower body dressing      What is the patient wearing?: Pants, Incontinence brief     Lower body assist Assist for lower body dressing: Maximal Assistance - Patient 25 - 49%     Toileting Toileting Toileting Activity did not occur (Clothing management and hygiene only): N/A (no void or bm)  Toileting assist Assist for toileting: Maximal Assistance - Patient 25 - 49%     Transfers Chair/bed transfer  Transfers assist     Chair/bed transfer assist level: Moderate Assistance - Patient 50 - 74%     Locomotion Ambulation   Ambulation assist   Ambulation activity did not occur: Safety/medical  concerns  Assist level: Contact Guard/Touching assist Assistive device: Parallel bars Max distance: 10   Walk 10 feet activity   Assist  Walk 10 feet activity did not occur: Safety/medical concerns  Assist level: Contact Guard/Touching assist Assistive device: Parallel bars   Walk 50 feet activity   Assist Walk 50 feet with 2 turns activity did not occur: Safety/medical concerns         Walk 150 feet activity   Assist Walk 150 feet activity did not occur: Safety/medical concerns         Walk 10 feet on uneven surface  activity   Assist Walk 10 feet on uneven surfaces activity did not occur: Safety/medical concerns         Wheelchair     Assist Is the patient using a wheelchair?: Yes Type of Wheelchair: Manual    Wheelchair assist level: Dependent - Patient 0%      Wheelchair 50 feet with 2 turns activity    Assist        Assist Level: Dependent - Patient 0%   Wheelchair 150 feet activity     Assist      Assist Level: Dependent - Patient 0%   Blood pressure 105/62, pulse 96, temperature 98.2 F (36.8 C), temperature source Oral, resp. rate 15, height 5' (1.524 m), weight 47.6 kg, SpO2 96%.  Medical Problem List and Plan: 1. 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 She also had R elbow hardware removal and olecranon excision and triceps advancement 11/28/23.             -patient may not  shower due to VAC             -ELOS/Goals: 12-14 days- Superviison to min A WBAT with walker RLE-posterior hip precautions Partial weightbearing RUE okay to use walker per Dr. Edna- s/p R elbow hardware removal - Message ortho to confirm wt bearing use with walker- OK for weightbearing but no bending elbow-discussed with Dr. Edna, appreciate assistance.  Okay to remove splint when thermoplastic splint is ready.  Okay to remove this for wound care and hygiene otherwise needs to stay  on.  -Continue CIR - Patient was seen by Kemp Hila Cockerham-possible suture removal Monday, plan for right upper extremity splint to be made today 12/11/2023 -Addendum: social work note indicates step-grandson committed suicide  - Patient seen by orthopedics  7/21 -15/7 schedule -Expected 7/30   - Patient was seen by surgery-chart review indicates JP drain was removed, appears plan for continued wound VAC with change to Prevena on discharge. follow-up in [facility] office 7 days after discharge for possible removal  2.  Antithrombotics: -DVT/anticoagulation:  Mechanical:  Antiembolism stockings, knee (TED hose) Bilateral lower extremities Sequential compression devices, below knee Bilateral lower extremities             -antiplatelet therapy: Aspirin  81 mg held due to bleeding risk  3. Pain Management: Oxycodone  and Tylenol  as needed             - Continue Cymbalta  20 mg daily and Flexeril  10 mg at bedtime 4. Mood/Behavior/Sleep: ego support             -antipsychotic agents: N/A 5. Neuropsych/cognition: This patient is intermittently capable of making decisions on her own behalf.  6. Skin/Wound Care: Routine pressure relief measures             -JP drain-per Ortho will discontinue when output < 10 cc per shift             -Continue negative pressure at 75 mmHg--can be exchanged to Prevena  - MASD-7/15 start Diflucan  200 mg then 100 mg x 6 days -Gerhardt's  -Diffuse ecchymoses- Platelets 330 CTM Routine labs, follow-up with derm/hematology outpatient. -ID pharm recommending 4 weeks abx from time of OR for elbow 8/2, finish cipro  for UTI then switch back to cefadroxil , CRP is elevated  7. Fluids/Electrolytes/Nutrition: Monitor I's and O's, routine labs             -Diet Heart Healthy-- SLP recc nectar thick- pt not agreeable  -Ensure High protein + Zinc  and vitamin C    8. Hypertension/ intermittent mild tachycardia: Continue Coreg  3.125 mg BID,              -7/18-25BP controlled,  continue to monitor   Vitals:   12/14/23 2205 12/15/23 0444 12/15/23 1448 12/15/23 1940  BP: (!) 125/52 131/62 111/72 135/66   12/16/23 0556 12/16/23 1333 12/16/23 2041 12/17/23 0420  BP: (!) 131/58 114/60 128/67 122/74   12/17/23 1354 12/17/23 1948 12/18/23 0411 12/18/23 1310  BP: (!) 107/58 127/80 136/61 105/62      9. Rheumatoid arthritis-continue home prednisone  5 mg daily and methotrexate  12.5mg  weekly- methotrexate  just restarted.   10. Microcytic anemia/acute blood loss anemia: monitor for signs of bleeding recheck CBC in a.m   -normal folate & B12, iron study suggestive of AOC, status post 3 units PRBC. 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 Constipation- senna 8.6mg  BID  - 7/16 reports improvement after multiple bowel movements yesterday  -7/17 LMB 7/15, continue to monitor  -7/18 miralrax daily, PRN sorbitol   -7/24 LBM documented 7/22, will increase to BID, consider additional medication tomorrow if no BM  13. Dysphagia, chronic  -Prior SLP eval re nectar thick, she has been refusing modification/nectar thick liquids -SLP has been consulted- she continues to decline interventions  14. Hypokalemia/Hyponateria  -7/17 potassium x2 today, recheck tomorrow 7/18 K+ up to 4.5, NA lower at 128  -Check urine sodium/osmolality due to hyponatremia, asymptomatic,  Cr higher then her baseline at 0.8 today, HGB higher on 7/15 and 7/17 think she is a little fluid depleted, add NS 50ml/hr and monitor response  -Cr 0.57, Na 131, K 3.7. will stop fluids today and recheck Monday. Urine studies not done yet.  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  On salt tabs and furosemide  and protein supplement was started by nephrology.  Sodium a little higher at 131 today  15. Nausea  -compazine  PRN -7/19 improved today, discussed with pharmacy to try to adjust medication timing.  Nursing order to try to do medications after breakfast   16. HLD: continue atorvastatin  10mg  nightly.   17. Overactive Bladder- cont Oxybutynin  5mg  nightly -12/13/23 pt now having urinary retention requiring caths. Will stop Oxybutinin, monitor off it for now, if still having retention then would get U/A and consider adding urecholine  -7/21 will add Flomax , check UA  -7/22 volumes appear little better, start Macrobid   -7/23 will ask nursing continue to check bladder scans/PVR  -7/25 will try lose dose urocholine 5mg  TID for retention  18. Suspected UTI  -7/22 Increased retention, potentially a little more confused- suspect UTI Discussed with ID pharm, start macrobid - appreciate assistance. Follow cultures  -7/23 ABX changed to cipro  was growing pseudomonas, cultures indicate it is sensitive     LOS: 10 days A FACE TO FACE EVALUATION WAS PERFORMED  Murray Collier 12/18/2023, 4:15 PM

## 2023-12-18 NOTE — Progress Notes (Signed)
 Occupational Therapy Session Note  Patient Details  Name: Sherri  JULIAH [name] MRN: [id number] Date of Birth: 30-Sep-1940  Today's Date: 12/18/2023 OT Individual Time: 8881-8796 OT Individual Time Calculation (min): 45 min    Short Term Goals: Week 2:  OT Short Term Goal 1 (Week 2): STG=LTG d.t ELOs  Skilled Therapeutic Interventions/Progress Updates:  Patient agreeable to participate in OT session. Reports 4/10 pain level, premedicated.   Patient participated in skilled OT session focusing on transfer training, LE strengthening, balance training, and WB to increase safety in home with caregivers for mobility and ADL LB. Patient able to increase dynamic balance. Therapist facilitated increase in stability utilizing verbal cues to promote safety and compliance with precautions.  Patient able to tolerate with increased rest breaks due to fatigue. OT educated caregiver on home safety. Patient returned to bed all needs in reach.   Therapy Documentation Precautions:  Precautions Precautions: Fall, Posterior Hip, Other (comment) (PWB RUE- only for walking) Precaution Booklet Issued: No Recall of Precautions/Restrictions: Impaired Precaution/Restrictions Comments: R JP drain, RLE wound vac, MASD Required Braces or Orthoses: Splint/Cast Knee Immobilizer - Right: On at all times Splint/Cast: RUE Restrictions Weight Bearing Restrictions Per Provider Order: Yes RUE Weight Bearing Per Provider Order: Partial weight bearing RUE Partial Weight Bearing Percentage or Pounds: okay to use walker, confirmed with MD/ortho, okay to weight bear right UE as long as R elbow remains extended RLE Weight Bearing Per Provider Order: Weight bearing as tolerated Other Position/Activity Restrictions: posterior hip precautions  Therapy/Group: Individual Therapy  D'mariea L Raedyn Klinck 12/18/2023, 7:55 AM

## 2023-12-18 NOTE — Progress Notes (Signed)
    Subjective:  Patient reports pain as mild.  Denies N/V/CP/SOB.  Family at bedside.  She reports some progression with therapy.  She reports mild hip pain.  She states she may be getting discharged next Wednesday.   Objective:   VITALS:   Vitals:   12/17/23 1354 12/17/23 1948 12/18/23 0411 12/18/23 0500  BP: (!) 107/58 127/80 136/61   Pulse: 80 86 93   Resp: 15 18 19    Temp: 97.6 F (36.4 C) 98 F (36.7 C) 98 F (36.7 C)   TempSrc: Oral     SpO2: 96% 94% 93%   Weight:    47.6 kg  Height:        NAD Neurologically intact ABD soft Neurovascular intact Sensation intact distally Intact pulses distally Dorsiflexion/Plantar flexion intact Compartment soft Prevena negative pressure incisional dressing removed today. Sutures intact, benign well healing incision. Sutures removed and incision cleansed with betadine . New Prevena negative pressure incisional dressing placed today. , no leaks detected.  JP drain removed, cleansed with betadine . Dry dressing placed, change daily as needed.  No shortening or ER noted.   Lab Results  Component Value Date   WBC 7.8 12/17/2023   HGB 8.6 (L) 12/17/2023   HCT 25.8 (L) 12/17/2023   MCV 95.6 12/17/2023   PLT 179 12/17/2023   BMET    Component Value Date/Time   NA 131 (L) 12/18/2023 0521   NA 139 06/20/2019 1335   NA 136 02/22/2015 0000   K 3.7 12/18/2023 0521   K 4.4 02/22/2015 0000   CL 98 12/18/2023 0521   CL 98 02/22/2015 0000   CO2 24 12/18/2023 0521   CO2 30 02/22/2015 0000   GLUCOSE 81 12/18/2023 0521   BUN <5 (L) 12/18/2023 0521   BUN 10 06/20/2019 1335   CREATININE 0.52 12/18/2023 0521   CREATININE 1.03 (H) 08/06/2023 1510   CALCIUM  7.4 (L) 12/18/2023 0521   CALCIUM  9.6 02/22/2015 0000   EGFR 55 (L) 08/06/2023 1510   GFRNONAA >60 12/18/2023 0521   GFRNONAA 56 (L) 09/26/2020 1536     Assessment/Plan:     Principal Problem:   History of revision of total replacement of right hip joint Active  Problems:   Postoperative wound infection   Closed fracture of right elbow   WBAT with walker DVT ppx: none due to bleeding risk, SCDs, TEDS PO pain control PT/OT: Continue PT/OT Dispo:  - Patient under care of the medical team, disposition per their recommendation.  - Mobilize out of bed with PT.  - Continue negative pressure dressing at 75 mmHg. Upon discharge, the house VAC suction unit will need to be exchanged for a portable Prevena suction unit.  - She will need to follow-up in the office within 7 days of discharge for removal of her negative pressure dressing.    Sherri [name] Sherri [name] 12/18/2023, 11:27 AM   [facility]  [place] [address], [address], [place], [place] [address] Phone: [phone] [email or web address] Facebook  [employer]

## 2023-12-18 NOTE — Progress Notes (Signed)
 Speech Language Pathology Discharge Summary  Patient Details  Name: Sherri  JEWELENE [name] MRN: [id number] Date of Birth: 1941/03/26  Date of Discharge from SLP service:December 18, 2023  Today's Date: 12/18/2023 SLP Individual Time: 8976-8950 SLP Individual Time Calculation (min): 26 min  Skilled Therapeutic Interventions:  SLP conducted skilled therapy session targeting dysphagia goals. Upon SLP entry, patient asleep in bed and requiring repositioning and environmental modifications to rouse. Once awake, patient participated well throughout session. SLP facilitated regular/thin liquid diet trials with patient continuing to exhibit overt s/sx of diet intolerance, though continues to advocate for refusal of diet modifications. SLP guided patient through swallow exercises, and with use of external handout, benefited from supervision to min assist to accurately complete. Patient completed effortful swallow x10 and Masako tongue hold swallow x10. Patient's son entered during session and was receptive to education re: SLP goals, current diet status, safe swallow recommendations, and plan to discharge patient from SLP caseload due to limited progress and motivation to continue. Son verbalized understanding. At end of session, patient's surgeon entered to evaluate hip site, thus final 19 minutes of session missed. Patient was left in room with call bell in reach and alarm set. SLP will continue to target goals per plan of care.      Patient has met 1 of 1 long term goals.  Patient to discharge at [facility] level.  Reasons goals not met: n/Sherri   Clinical Impression/Discharge Summary:  Patient continues to forgo speech therapy recommendations and exhibits poor carryover impacting her ability to fully engage in improvement of functional swallow. Per MBS recommendations, safest diet continues to be Dys2 with nectar thick liquids, however patient continues to refuse modifications despite her ability to independently  state safety risks. SLP initiated pharyngeal strengthening exercises, however patient's compliance with exercise regimen is poor and thus no progress has been made. Patient has exceeded her goal, as she only requires supervision assist to complete swallowing exercises, but as patient is only intermittently willing to participate in swallowing exercises, they will likely not result in any change to swallowing function due to the need of consistent completion. Patient is aware of all of the above and often states that her swallowing will Be fine once she is back home despite prolonged education from SLP outlining chronic vs acute swallowing impairments and the likelihood that her dysphagia will not go away once venue changes. Patient education is complete and she is able to verbalize all provided information back to SLP. No further SLP needs are warranted at this time.   Care Partner:  Caregiver Able to Provide Assistance: Yes  Type of Caregiver Assistance: Cognitive  Recommendation:  None      Equipment:     Reasons for discharge: Treatment goals met   Patient/Family Agrees with Progress Made and Goals Achieved: Yes   Sherri [name], [name], CCC-SLP  Sherri [name] Sherri [name] 12/18/2023, 10:53 AM

## 2023-12-18 NOTE — Progress Notes (Addendum)
 Patient ID: Sherri  [name], female   DOB: [birth year], 83 y.o.   MRN: [id number] [facility] Progress Note   Assessment/ Plan:   1.  Hyponatremia: She appears to be euvolemic to possibly slightly hypovolemic on physical exam.  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  On salt tablets with furosemide  and today will start protein supplement.  No evidence of hypothyroidism or adrenal insufficiency. 2.  Hypocalcemia: Her albumin  corrected calcium  is within normal range.  Ionized calcium  pending. 3.  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  Rheumatoid arthritis: She is currently on methotrexate  and prednisone .  No evidence of adrenal insufficiency.  Subjective:   Reports to be feeling better this morning, nausea improving.   Objective:   BP 136/61 (BP Location: Left Arm)   Pulse 93   Temp 98 F (36.7 C)   Resp 19   Ht 5' (1.524 m)   Wt 47.6 kg   SpO2 93%   BMI 20.49 kg/m   Intake/Output Summary (Last 24 hours) at 12/18/2023 9081 Last data filed at 12/18/2023 0758 Gross per 24 hour  Intake 480 ml  Output 1570 ml  Net -1090 ml   Weight change:   Physical Exam: Gen: Comfortably sitting up in a chair at bedside CVS: Pulse regular rhythm, normal rate, S1 and S2 normal Resp: Clear to auscultation bilaterally, no rales/rhonchi Abd: Soft, flat, nontender, bowel sounds normal Ext: Right thigh ulcer with use gauze dressing.  Right upper extremity in Ace wrap  Imaging: DG HIP UNILAT WITH PELVIS 2-3 VIEWS RIGHT Result Date: 12/16/2023 CLINICAL DATA:  Status post revision of right hip arthroplasty. EXAM: DG HIP (WITH OR WITHOUT PELVIS) 2-3V RIGHT COMPARISON:  Preoperative radiographs FINDINGS: Revision right hip arthroplasty in expected alignment. No periprosthetic lucency or fracture. Recent postsurgical change  includes air and edema in the soft tissues. Left hip arthroplasty is partially included, intact were visualized. IMPRESSION: Revision right hip arthroplasty without immediate postoperative complication. Electronically Signed   By: Andrea Gasman M.D.   On: 12/16/2023 13:37    Labs: BMET Recent Labs  Lab 12/12/23 0707 12/14/23 0513 12/16/23 0428 12/17/23 0513 12/18/23 0521  NA 131* 132* 130* 128* 131*  K 3.7 3.6 3.4* 3.7 3.7  CL 96* 99 95* 95* 98  CO2 22 19* 23 24 24   GLUCOSE 83 75 75 79 81  BUN 5* <5* 5* <5* <5*  CREATININE 0.57 0.49 0.62 0.56 0.52  CALCIUM  7.7* 7.8* 7.6* 7.5* 7.4*  PHOS  --   --   --   --  2.2*   CBC Recent Labs  Lab 12/14/23 0513 12/16/23 0428 12/17/23 0513  WBC 7.6 10.4 7.8  NEUTROABS 5.7  --  7.2  HGB 11.4* 9.2* 8.6*  HCT 35.0* 27.4* 25.8*  MCV 98.9 95.5 95.6  PLT 176 178 179    Medications:     vitamin C   1,000 mg Oral Daily   atorvastatin   10 mg Oral QPM   carvedilol   3.125 mg Oral BID WC   ciprofloxacin   250 mg Oral BID   Followed by   NOREEN ON 12/20/2023] cefadroxil   500 mg Oral BID   cholecalciferol   1,000 Units Oral q AM   cyclobenzaprine   10 mg Oral QHS   DULoxetine   20 mg Oral QHS   feeding supplement  237 mL Oral BID BM   furosemide   10 mg  Oral Daily   Gerhardt's butt cream   Topical TID   methotrexate   12.5 mg Oral Q Wed   mouth rinse  15 mL Mouth Rinse 4 times per day   pantoprazole   40 mg Oral BID AC   polyethylene glycol  17 g Oral Daily   predniSONE   5 mg Oral Q breakfast   senna  1 tablet Oral BID   sodium chloride  flush  10 mL Intravenous Q12H   sodium chloride   1 g Oral TID WC   tamsulosin   0.4 mg Oral QPC supper   zinc  sulfate (50mg  elemental zinc )  220 mg Oral Q supper    Gordy Blanch, MD 12/18/2023, 9:18 AM

## 2023-12-18 NOTE — Plan of Care (Signed)
  Problem: RH Swallowing Goal: LTG Patient will participate in dysphagia therapy to increase swallow function with assistance (SLP) Description: LTG:  Patient will participate in dysphagia therapy to increase swallow function with assistance (SLP) Outcome: Completed/Met

## 2023-12-19 LAB — RENAL FUNCTION PANEL
Albumin: 1.8 g/dL — ABNORMAL LOW (ref 3.5–5.0)
Anion gap: 9 (ref 5–15)
BUN: <5 mg/dL — ABNORMAL LOW (ref 8–23)
CO2: 21 mmol/L — ABNORMAL LOW (ref 22–32)
Calcium: 7 mg/dL — ABNORMAL LOW (ref 8.9–10.3)
Chloride: 98 mmol/L (ref 98–111)
Creatinine, Ser: 0.46 mg/dL (ref 0.44–1.00)
GFR, Estimated: >60 mL/min (ref >60)
Glucose, Bld: 74 mg/dL (ref 70–99)
Phosphorus: 2.3 mg/dL — ABNORMAL LOW (ref 2.5–4.6)
Potassium: 3.4 mmol/L — ABNORMAL LOW (ref 3.5–5.1)
Sodium: 128 mmol/L — ABNORMAL LOW (ref 135–145)

## 2023-12-19 LAB — CALCIUM, IONIZED: Calcium, Ionized, Serum: 4.3 mg/dL — ABNORMAL LOW (ref 4.5–5.6)

## 2023-12-19 MED ORDER — POTASSIUM CHLORIDE CRYS ER 20 MEQ PO TBCR
20.0000 meq | EXTENDED_RELEASE_TABLET | Freq: Two times a day (BID) | ORAL | Status: AC
  Administered 2023-12-19 (×2): 20 meq via ORAL
  Filled 2023-12-19 (×2): qty 1

## 2023-12-19 NOTE — Progress Notes (Signed)
 Patient ID: Keiran  DELENA Glow, female   DOB: August 24, 1940, 83 y.o.   MRN: [id number] [facility] Progress Note   Assessment/ Plan:   1.  Hyponatremia: She appears to be euvolemic to possibly slightly hypovolemic on physical exam.  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  Sodium level slightly lower this morning and we discussed the importance of fluid restriction.  Again without indications for tolvaptan or hypertonic saline.  Continue protein supplementation with evidence of poor solute intake along with pain/nausea management. 2.  Hypocalcemia: Her albumin  corrected calcium  is within normal range.  Ionized calcium  pending. 3.  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  Rheumatoid arthritis: She is currently on methotrexate  and prednisone .  No evidence of adrenal insufficiency.  Subjective:   Reports to be feeling well this morning and just returned from physical therapy.  Intermittent nausea.   Objective:   BP 127/76 (BP Location: Left Arm)   Pulse 84   Temp 97.9 F (36.6 C)   Resp 16   Ht 5' (1.524 m)   Wt 50.9 kg   SpO2 91%   BMI 21.92 kg/m   Intake/Output Summary (Last 24 hours) at 12/19/2023 0916 Last data filed at 12/19/2023 0810 Gross per 24 hour  Intake 400 ml  Output 1200 ml  Net -800 ml   Weight change: 3.3 kg  Physical Exam: Gen: Comfortably sitting up in a chair at bedside CVS: Pulse regular rhythm, normal rate, S1 and S2 normal Resp: Clear to auscultation bilaterally, no rales/rhonchi Abd: Soft, flat, nontender, bowel sounds normal Ext: Right thigh ulcer with use gauze dressing.  Right upper extremity in Ace wrap  Imaging: No results found.   Labs: BMET Recent Labs  Lab 12/14/23 0513 12/16/23 0428 12/17/23 0513 12/18/23 0521  12/19/23 0519  NA 132* 130* 128* 131* 128*  K 3.6 3.4* 3.7 3.7 3.4*  CL 99 95* 95* 98 98  CO2 19* 23 24 24  21*  GLUCOSE 75 75 79 81 74  BUN <5* 5* <5* <5* <5*  CREATININE 0.49 0.62 0.56 0.52 0.46  CALCIUM  7.8* 7.6* 7.5* 7.4* 7.0*  PHOS  --   --   --  2.2* 2.3*   CBC Recent Labs  Lab 12/14/23 0513 12/16/23 0428 12/17/23 0513  WBC 7.6 10.4 7.8  NEUTROABS 5.7  --  7.2  HGB 11.4* 9.2* 8.6*  HCT 35.0* 27.4* 25.8*  MCV 98.9 95.5 95.6  PLT 176 178 179    Medications:     vitamin C   1,000 mg Oral Daily   atorvastatin   10 mg Oral QPM   bethanechol   5 mg Oral TID   carvedilol   3.125 mg Oral BID WC   ciprofloxacin   250 mg Oral BID   Followed by   NOREEN ON 12/20/2023] cefadroxil   500 mg Oral BID   cholecalciferol   1,000 Units Oral q AM   cyclobenzaprine   10 mg Oral QHS   DULoxetine   20 mg Oral QHS   feeding supplement  237 mL Oral BID BM   furosemide   10 mg Oral Daily   Gerhardt's butt cream   Topical TID   methotrexate   12.5 mg Oral Q Wed   mouth rinse  15 mL Mouth Rinse 4 times per day   pantoprazole   40 mg Oral BID AC   polyethylene glycol  17  g Oral BID   potassium chloride   20 mEq Oral BID   predniSONE   5 mg Oral Q breakfast   senna  1 tablet Oral BID   sodium chloride  flush  10 mL Intravenous Q12H   sodium chloride   1 g Oral TID WC   tamsulosin   0.4 mg Oral QPC supper   zinc  sulfate (50mg  elemental zinc )  220 mg Oral Q supper    Gordy Blanch, MD 12/19/2023, 9:16 AM

## 2023-12-19 NOTE — Progress Notes (Signed)
 PROGRESS NOTE   Subjective/Complaints:  Pt doing fine, slept ok, pain doing ok, LBM yesterday maybe? Last documented 7/22. Pt unsure but thinks yesterday. Urinating ok but also being cath'd. No other complaints or concerns.   ROS: as per HPI. Denies CP, SOB, abd pain, N/V/D, or any other complaints at this time.   + chronic joint pain + bladder incontinence/retention- continued + Fatigue + Right leg pain   Objective:   No results found.   Recent Labs    12/17/23 0513  WBC 7.8  HGB 8.6*  HCT 25.8*  PLT 179    Recent Labs    12/18/23 0521 12/19/23 0519  NA 131* 128*  K 3.7 3.4*  CL 98 98  CO2 24 21*  GLUCOSE 81 74  BUN <5* <5*  CREATININE 0.52 0.46  CALCIUM  7.4* 7.0*    Intake/Output Summary (Last 24 hours) at 12/19/2023 1208 Last data filed at 12/19/2023 0810 Gross per 24 hour  Intake 300 ml  Output 1200 ml  Net -900 ml        Physical Exam: Vital Signs Blood pressure 104/65, pulse 97, temperature 97.9 F (36.6 C), resp. rate 16, height 5' (1.524 m), weight 50.9 kg, SpO2 91%.  General: Sitting in WC, NAD HEENT: Head is normocephalic, atraumatic, sclera anicteric, lips moist Neck: Supple without JVD or lymphadenopathy Heart: Reg rate and rhythm. No murmurs rubs or gallops Chest: CTA bilaterally without wheezes, rales, or rhonchi; no distress Abdomen: Soft, non-tender, non-distended, bowel sounds positive. Extremities: No significant lower extremity edema, R leg with wrap Psych: calm and cooperative Skin: Thin skin, multiple skin tears and covered areas, scattered bruising.   PRIOR EXAMS: Neuro: Awake and alert, confused  at times, oriented  to person place, year, not month or day (she says August 30) , sensation intact in all 4 extremities to LT, hard of hearing   MSK:  Arthritic changes noted in her hands RUE with splint and ace wrap R leg has drain with small amount sanguinous drainage in  bulb and VAC also has small amount drainage in it Lifts bilateral upper extremities to gravity RLE- HF 3/5;  KE 4-/5, DF/PF 4/5 LLE- 5-/5 throughout   Image from 7/19   Drain site 7/24  Assessment/Plan: 1. Functional deficits which require 3+ hours per day of interdisciplinary therapy in a comprehensive inpatient rehab setting. Physiatrist is providing close team supervision and 24 hour management of active medical problems listed below. Physiatrist and rehab team continue to assess barriers to discharge/monitor patient progress toward functional and medical goals  Care Tool:  Bathing    Body parts bathed by patient: Right arm, Left upper leg, Left arm, Chest, Abdomen, Front perineal area, Right upper leg, Face   Body parts bathed by helper: Right lower leg, Left lower leg, Buttocks, Front perineal area     Bathing assist Assist Level: Moderate Assistance - Patient 50 - 74%     Upper Body Dressing/Undressing Upper body dressing   What is the patient wearing?: Pull over shirt    Upper body assist Assist Level: Set up assist    Lower Body Dressing/Undressing Lower body dressing      What  is the patient wearing?: Pants, Incontinence brief     Lower body assist Assist for lower body dressing: Maximal Assistance - Patient 25 - 49%     Toileting Toileting Toileting Activity did not occur (Clothing management and hygiene only): N/A (no void or bm)  Toileting assist Assist for toileting: Maximal Assistance - Patient 25 - 49%     Transfers Chair/bed transfer  Transfers assist     Chair/bed transfer assist level: Moderate Assistance - Patient 50 - 74%     Locomotion Ambulation   Ambulation assist   Ambulation activity did not occur: Safety/medical concerns  Assist level: Contact Guard/Touching assist Assistive device: Parallel bars Max distance: 10   Walk 10 feet activity   Assist  Walk 10 feet activity did not occur: Safety/medical concerns  Assist  level: Contact Guard/Touching assist Assistive device: Parallel bars   Walk 50 feet activity   Assist Walk 50 feet with 2 turns activity did not occur: Safety/medical concerns         Walk 150 feet activity   Assist Walk 150 feet activity did not occur: Safety/medical concerns         Walk 10 feet on uneven surface  activity   Assist Walk 10 feet on uneven surfaces activity did not occur: Safety/medical concerns         Wheelchair     Assist Is the patient using a wheelchair?: Yes Type of Wheelchair: Manual    Wheelchair assist level: Dependent - Patient 0%      Wheelchair 50 feet with 2 turns activity    Assist        Assist Level: Dependent - Patient 0%   Wheelchair 150 feet activity     Assist      Assist Level: Dependent - Patient 0%   Blood pressure 104/65, pulse 97, temperature 97.9 F (36.6 C), resp. rate 16, height 5' (1.524 m), weight 50.9 kg, SpO2 91%.  Medical Problem List and Plan: 1. 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 She also had R elbow hardware removal and olecranon excision and triceps advancement 11/28/23.             -patient may not  shower due to VAC             -ELOS/Goals: 12-14 days- Superviison to min A WBAT with walker RLE-posterior hip precautions Partial weightbearing RUE okay to use walker per Dr. Edna- s/p R elbow hardware removal - Message ortho to confirm wt bearing use with walker- OK for weightbearing but no bending elbow-discussed with Dr. Edna, appreciate assistance.  Okay to remove splint when thermoplastic splint is ready.  Okay to remove this for wound care and hygiene otherwise needs to stay on.  -Continue CIR - Patient was seen by Kemp Hila Cockerham-possible suture removal Monday, plan for right upper extremity splint to be made today 12/11/2023 -Addendum: social work note indicates step-grandson committed suicide  -  Patient seen by orthopedics  7/21 -15/7 schedule -Expected 7/30   - Patient was seen by surgery-chart review indicates JP drain was removed, appears plan for continued wound VAC with change to Prevena on discharge. follow-up in [facility] office 7 days after discharge for possible removal  2.  Antithrombotics: -DVT/anticoagulation:  Mechanical:  Antiembolism stockings, knee (TED hose) Bilateral lower extremities Sequential compression devices, below knee Bilateral lower extremities             -antiplatelet therapy: Aspirin   81 mg held due to bleeding risk  3. Pain Management: Oxycodone  and Tylenol  as needed             - Continue Cymbalta  20 mg daily and Flexeril  10 mg at bedtime 4. Mood/Behavior/Sleep: ego support             -antipsychotic agents: N/A 5. Neuropsych/cognition: This patient is intermittently capable of making decisions on her own behalf.  6. Skin/Wound Care: Routine pressure relief measures             -JP drain-per Ortho will discontinue when output < 10 cc per shift             -Continue negative pressure at 75 mmHg--can be exchanged to Prevena  - MASD-7/15 start Diflucan  200 mg then 100 mg x 6 days -Gerhardt's  -Diffuse ecchymoses- Platelets 330 CTM Routine labs, follow-up with derm/hematology outpatient. -ID pharm recommending 4 weeks abx from time of OR for elbow 8/2, finish cipro  for UTI then switch back to cefadroxil , CRP is elevated  7. Fluids/Electrolytes/Nutrition: Monitor I's and O's, routine labs             -Diet Heart Healthy-- SLP recc nectar thick- pt not agreeable  -Ensure High protein + Zinc  and vitamin C    8. Hypertension/ intermittent mild tachycardia: Continue Coreg  3.125 mg BID,              -7/18-26 BP controlled, continue to monitor   Vitals:   12/15/23 1940 12/16/23 0556 12/16/23 1333 12/16/23 2041  BP: 135/66 (!) 131/58 114/60 128/67   12/17/23 0420 12/17/23 1354 12/17/23 1948 12/18/23 0411  BP: 122/74 (!) 107/58 127/80 136/61   12/18/23  1310 12/18/23 1955 12/19/23 0459 12/19/23 1002  BP: 105/62 (!) 109/55 127/76 104/65      9. Rheumatoid arthritis-continue home prednisone  5 mg daily and methotrexate  12.5mg  weekly- methotrexate  just restarted.   10. Microcytic anemia/acute blood loss anemia: monitor for signs of bleeding recheck CBC in a.m   -normal folate & B12, iron study suggestive of AOC, status post 3 units PRBC. 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 Constipation- senna 8.6mg  BID  - 7/16 reports improvement after multiple bowel movements yesterday  -7/17 LMB 7/15, continue to monitor  -7/18 miralrax daily, PRN sorbitol  -7/24 LBM documented 7/22, will increase to BID, consider additional medication tomorrow if no BM -12/19/23 pt stating LBM yesterday but not documented. Having nursing give PRN sorbitol  today to see if she has a BM today.   13. Dysphagia, chronic  -Prior SLP eval re nectar thick, she has been refusing modification/nectar thick liquids -SLP has been consulted- she continues to decline interventions  14. Hypokalemia/Hyponatremia  -7/17 potassium x2 today, recheck tomorrow 7/18 K+ up to 4.5, NA lower at 128  -Check urine sodium/osmolality due to hyponatremia, asymptomatic,  Cr higher then her baseline at 0.8 today, HGB higher on 7/15 and 7/17 think she is a little fluid depleted, add NS 50ml/hr and monitor response  -Cr 0.57, Na 131, K 3.7. will stop fluids today and recheck Monday. Urine studies not done yet.  -7/21 Sodium up to 132, will add 1 g sodium chloride  twice daily -7/23 Hypokalemia lower at 3.4, give 30meq potassium, NA 130, will increase salt tabs to three times a day, urine labs still not completed- message nursing -7/24 K+ stable, NA down to  128, check cortisol, urine labs pending, Serum osmolality  -7/25 consult nephrology -appreciate assistance, suspecting combination SIADH and poor  solute intake.  On salt tabs and furosemide  and protein supplement was started by nephrology.  Sodium a little higher at 131 today -12/19/23 Na 128, appreciate nephro support, recommending fluid restriction. Monitor. K 3.4, stable, receiving Klor 20mEq BID  15. Nausea  -compazine  PRN -7/19 improved today, discussed with pharmacy to try to adjust medication timing.  Nursing order to try to do medications after breakfast   16. HLD: continue atorvastatin  10mg  nightly.   17. Overactive Bladder- cont Oxybutynin  5mg  nightly -12/13/23 pt now having urinary retention requiring caths. Will stop Oxybutinin, monitor off it for now, if still having retention then would get U/A and consider adding urecholine   -7/21 will add Flomax , check UA  -7/22 volumes appear little better, start Macrobid   -7/23 will ask nursing continue to check bladder scans/PVR  -7/25 will try low dose urocholine 5mg  TID for retention   -12/19/23 still needing cathing, monitor for now since just started meds 18. Suspected UTI -7/22 Increased retention, potentially a little more confused- suspect UTI Discussed with ID pharm, start macrobid - appreciate assistance. Follow cultures -7/23 ABX changed to cipro  250mg  BID was growing pseudomonas, cultures indicate it is sensitive-- followed by duricef to complete that course    LOS: 11 days A FACE TO FACE EVALUATION WAS PERFORMED  [id number] Saprina Chuong 12/19/2023, 12:08 PM

## 2023-12-19 NOTE — Progress Notes (Signed)
 Occupational Therapy Session Note  Patient Details  Name: Sherri [name] MRN: [id number] Date of Birth: [birth year]  Today's Date: 12/19/2023 OT Individual Time: 8981-8883 OT Individual Time Calculation (min): 58 min    Short Term Goals: Week 2:  OT Short Term Goal 1 (Week 2): STG=LTG d.t ELOs  Skilled Therapeutic Interventions/Progress Updates:   Patient agreeable to participate in OT session. Reports 7/10 pain level, medicated at beginning of session.   Patient participated in skilled OT session focusing on UE strengthening, tolerance to activity, fine motor skills. Patient received in bed with complaints of increased fatigue. OT provided verbal cues and max encouragement for participation. Patient able to complete bed to chair transfer min A. Patient completed UE strengthening for horizontal abduction and rows 2x15 with yellow band. Patient completed shoulder flexion no weight 2x10 to increase UE use. Patient required several therapeutic rest breaks due to increased fatigue today. Patient completed fine motor activities with decreased efficiency on R side due to tremor and weakness. Patient able to complete fine motor tasks with increased time. Patient returned to room, transfer to bed mod A with patient reporting feeling of falling during transfer. OT provided verbal cueing and encouragement to complete transfer. Max A sit to supine.    Therapy Documentation Precautions:  Precautions Precautions: Fall, Posterior Hip, Other (comment) (PWB RUE- only for walking) Precaution Booklet Issued: No Recall of Precautions/Restrictions: Impaired Precaution/Restrictions Comments: R JP drain, RLE wound vac, MASD Required Braces or Orthoses: Splint/Cast Knee Immobilizer - Right: On at all times Splint/Cast: RUE Restrictions Weight Bearing Restrictions Per Provider Order: Yes RUE Weight Bearing Per Provider Order: Partial weight bearing RUE Partial Weight Bearing Percentage or Pounds: okay to  use walker, confirmed with MD/ortho, okay to weight bear right UE as long as R elbow remains extended RLE Weight Bearing Per Provider Order: Weight bearing as tolerated Other Position/Activity Restrictions: posterior hip precautions   Therapy/Group: Individual Therapy  D'mariea L Mikena Masoner 12/19/2023, 7:27 AM

## 2023-12-19 NOTE — Plan of Care (Signed)
  Problem: Consults Goal: RH GENERAL PATIENT EDUCATION Description: See Patient Education module for education specifics. Outcome: Progressing   Problem: RH BOWEL ELIMINATION Goal: RH STG MANAGE BOWEL WITH ASSISTANCE Description: STG Manage Bowel with mod I Assistance. Outcome: Progressing Goal: RH STG MANAGE BOWEL W/MEDICATION W/ASSISTANCE Description: STG Manage Bowel with Medication with mod I Assistance. Outcome: Progressing   Problem: RH BLADDER ELIMINATION Goal: RH STG MANAGE BLADDER WITH ASSISTANCE Description: STG Manage Bladder With mod I Assistance Outcome: Progressing Goal: RH STG MANAGE BLADDER WITH MEDICATION WITH ASSISTANCE Description: STG Manage Bladder With Medication With mod I Assistance. Outcome: Progressing   Problem: RH SKIN INTEGRITY Goal: RH STG SKIN FREE OF INFECTION/BREAKDOWN Description: Manage skin w min assist Outcome: Progressing Goal: RH STG MAINTAIN SKIN INTEGRITY WITH ASSISTANCE Description: STG Maintain Skin Integrity With min  Assistance. Outcome: Progressing   Problem: RH SAFETY Goal: RH STG ADHERE TO SAFETY PRECAUTIONS W/ASSISTANCE/DEVICE Description: STG Adhere to Safety Precautions With cues Assistance/Device. Outcome: Progressing   Problem: RH PAIN MANAGEMENT Goal: RH STG PAIN MANAGED AT OR BELOW PT'S PAIN GOAL Description: Pain < 4 with prns Outcome: Progressing   Problem: RH KNOWLEDGE DEFICIT GENERAL Goal: RH STG INCREASE KNOWLEDGE OF SELF CARE AFTER HOSPITALIZATION Description: Patient and family will be able to manage care at discharge using educational resources for medications and dietary modification with skin care independently Outcome: Progressing

## 2023-12-19 NOTE — Progress Notes (Signed)
 Physical Therapy Session Note  Patient Details  Name: Sherri [name] MRN: [id number] Date of Birth: [birth year]  Today's Date: 12/19/2023 PT Individual Time: 0801-0857 PT Individual Time Calculation (min): 56 min   Short Term Goals: Week 1:  PT Short Term Goal 1 (Week 1): pt will perform sit to stand with LRAD and CGA PT Short Term Goal 1 - Progress (Week 1): Progressing toward goal PT Short Term Goal 2 (Week 1): pt will perform bed to chair transfer with RW and min A PT Short Term Goal 2 - Progress (Week 1): Progressing toward goal PT Short Term Goal 3 (Week 1): pt will ambuate 5 feet with LRAD and min A PT Short Term Goal 3 - Progress (Week 1): Partly met  Skilled Therapeutic Interventions/Progress Updates:  Pt was seen bedside in the am. Pt willing to participate with therapy. Therapist to don pants. Pt transferred to edge of bed with min A and verbal cues. Pt transferred edge of bed to standing with min A and verbal cues. Pt total A to pull pants. Pt transferred to w/c with stedy and A x 1. Pt transported to rehab gym. In gym treatment initially focused on black practice in the parallel bars, 5 reps sit to stand with min A and verbal cues. Pt worked on sit to stand transfers from w/c with rolling walker and min A with verbal cues for 5 reps. Pt's standing tolerance about 15 to 30 seconds each stand with min A and verbal cues. Pt returned to room following treatment. Pt left sitting up in w/c with chair alarm in place and call bell within reach.   Therapy Documentation Precautions:  Precautions Precautions: Fall, Posterior Hip, Other (comment) (PWB RUE- only for walking) Precaution Booklet Issued: No Recall of Precautions/Restrictions: Impaired Precaution/Restrictions Comments: R JP drain, RLE wound vac, MASD Required Braces or Orthoses: Splint/Cast Knee Immobilizer - Right: On at all times Splint/Cast: RUE Restrictions Weight Bearing Restrictions Per Provider Order: Yes RUE  Weight Bearing Per Provider Order: Partial weight bearing RUE Partial Weight Bearing Percentage or Pounds: okay to use walker, confirmed with MD/ortho, okay to weight bear right UE as long as R elbow remains extended RLE Weight Bearing Per Provider Order: Weight bearing as tolerated Other Position/Activity Restrictions: posterior hip precautions General:   Pain: Pain Assessment Pain Scale: 0-10 Pain Score: 7  Pain Location: Hip Pain Intervention(s): Medication (See eMAR)   Therapy/Group: Individual Therapy  Merilee Lynwood MATSU 12/19/2023, 11:25 AM

## 2023-12-19 NOTE — Progress Notes (Signed)
 Physical Therapy Session Note  Patient Details  Name: Sherri [name] MRN: [id number] Date of Birth: Oct 26, 1940  Today's Date: 12/19/2023 PT Individual Time: 1422-1502 PT Individual Time Calculation (min): 40 min   Short Term Goals: Week 2:  PT Short Term Goal 1 (Week 2): STG=LTG 2/2 ELOS  Skilled Therapeutic Interventions/Progress Updates: Patient supine in bed (HOB elevated) on entrance to room. Patient alert and agreeable to PT session.   Patient reported unrated pain in R hip (premedicated)   Therapeutic Activity: Bed Mobility: Pt performed supine<sit on EOB with minA (HOB elevated) and VC for sequence and use of railing (reported having raising HOB feature at home with railing). Transfers: Pt performed sit<>stand transfers throughout session with RW and with CGA (light minA to stand from EOB) with VC for hand placement.  Pt transported from room<> day room gym in [facility] and ambulated 5' x 1 and required seated rest break due to reports of B LE's fatiguing. Pt provided with encouragement to ambulate again to meet LTG of 10' with target to reach. Pt with short seated rest break and ambulated 10' with increased encouragement throughout. Pt briefly stopped  in RW and requested to return to [facility], and when PTA stated that pt's daughter was following with WC, pt was able to progress to meet 10' goal! Pt sat and when asked what was going through pt's thought at that time of wanting to sit, pt reported it was due to increase in FOF. PTA continued to encourage pt that pt can ambulate the distance, and that the FOF can also lead to the very fall itself, and that it would make it harder to progress down the road if pt avoids doing so out of that fear. PTA also encouraged that when pt is ambulating that there are skilled hands around to further decrease risk of falling with daughter also providing same encouragement. Pt thankful for intervention. During ambulation, pt presented with decreased weight shift to  R and increase lean to L (anteriorly). Only cue provided was to maintain neutral step width per narrow BOS presentation. Pt also with ataxic like coordination with R drift that required VC to maintain forward path with manual placement of RW for safety at one point on 10' trial.   Patient sitting in WC at end of session with brakes locked, daughter present, belt alarm set, and all needs within reach.      Therapy Documentation Precautions:  Precautions Precautions: Fall, Posterior Hip, Other (comment) (PWB RUE- only for walking) Precaution Booklet Issued: No Recall of Precautions/Restrictions: Impaired Precaution/Restrictions Comments: R JP drain, RLE wound vac, MASD Required Braces or Orthoses: Splint/Cast Knee Immobilizer - Right: On at all times Splint/Cast: RUE Restrictions Weight Bearing Restrictions Per Provider Order: Yes RUE Weight Bearing Per Provider Order: Partial weight bearing RUE Partial Weight Bearing Percentage or Pounds: okay to use walker, confirmed with MD/ortho, okay to weight bear right UE as long as R elbow remains extended RLE Weight Bearing Per Provider Order: Weight bearing as tolerated Other Position/Activity Restrictions: posterior hip precautions  Therapy/Group: Individual Therapy  Myah Guynes PTA 12/19/2023, 3:17 PM

## 2023-12-19 NOTE — Plan of Care (Signed)
  Problem: RH Bathing Goal: LTG Patient will bathe all body parts with assist levels (OT) Description: LTG: Patient will bathe all body parts with assist levels (OT) Note: With AE   Problem: RH Dressing Goal: LTG Patient will perform upper body dressing (OT) Description: LTG Patient will perform upper body dressing with assist, with/without cues (OT). Flowsheets (Taken 12/19/2023 1239) LTG: Pt will perform upper body dressing with assistance level of: Contact Guard/Touching assist Note: Downgraded due to abilities Goal: LTG Patient will perform lower body dressing w/assist (OT) Description: LTG: Patient will perform lower body dressing with assist, with/without cues in positioning using equipment (OT) Flowsheets (Taken 12/19/2023 1239) LTG: Pt will perform lower body dressing with assistance level of: Moderate Assistance - Patient 50 - 74% Note: Downgraded due to patients functional abilities    Problem: RH Toileting Goal: LTG Patient will perform toileting task (3/3 steps) with assistance level (OT) Description: LTG: Patient will perform toileting task (3/3 steps) with assistance level (OT)  Flowsheets (Taken 12/19/2023 1239) LTG: Pt will perform toileting task (3/3 steps) with assistance level: Moderate Assistance - Patient 50 - 74% Note: Downgraded due to patients functional abilities

## 2023-12-20 DIAGNOSIS — I951 Orthostatic hypotension: Secondary | ICD-10-CM

## 2023-12-20 LAB — RENAL FUNCTION PANEL
Albumin: 1.9 g/dL — ABNORMAL LOW (ref 3.5–5.0)
Anion gap: 8 (ref 5–15)
BUN: 6 mg/dL — ABNORMAL LOW (ref 8–23)
CO2: 22 mmol/L (ref 22–32)
Calcium: 7.8 mg/dL — ABNORMAL LOW (ref 8.9–10.3)
Chloride: 98 mmol/L (ref 98–111)
Creatinine, Ser: 0.52 mg/dL (ref 0.44–1.00)
GFR, Estimated: >60 mL/min (ref >60)
Glucose, Bld: 96 mg/dL (ref 70–99)
Phosphorus: 2.7 mg/dL (ref 2.5–4.6)
Potassium: 3.9 mmol/L (ref 3.5–5.1)
Sodium: 128 mmol/L — ABNORMAL LOW (ref 135–145)

## 2023-12-20 LAB — OCCULT BLOOD X 1 CARD TO LAB, STOOL: Fecal Occult Bld: NEGATIVE

## 2023-12-20 MED ORDER — UREA 15 G PO PACK
15.0000 g | PACK | Freq: Two times a day (BID) | ORAL | Status: AC
  Administered 2023-12-20 – 2023-12-21 (×4): 15 g via ORAL
  Filled 2023-12-20 (×4): qty 1

## 2023-12-20 NOTE — Progress Notes (Signed)
 Occupational Therapy Session Note  Patient Details  Name: Sherri [name]  HODA HON MRN: [id number] Date of Birth: [birth year]  Today's Date: 12/20/2023 OT Individual Time: 1115-1200 OT Individual Time Calculation (min): 45 min    Short Term Goals: Week 1:  OT Short Term Goal 1 (Week 1): patient will demonstrate increased static standing balance to complete LB ADLs OT Short Term Goal 1 - Progress (Week 1): Met OT Short Term Goal 2 (Week 1): patient will demo increase in use of RUE as stabilizer to increase ADL participation OT Short Term Goal 2 - Progress (Week 1): Met OT Short Term Goal 3 (Week 1): patient will demo ability to don pants mod A with AE. OT Short Term Goal 3 - Progress (Week 1): Met  Skilled Therapeutic Interventions/Progress Updates:    At the time of arrival, the patient was seated at w/c LOF, she indicated that she was unable to rest  during the night secondary to receiving a catheter.  At the beginning of the session, the  pt reported at pain response of 6 on 0-10 for chronic joint pain that was addressed by nursing prior to my arrival.  The pt presented with resting BP 100/69 with the abdominal binder and teds in place.   The pt began the session with sit to stands 4x for a count of 10 in standing followed by 3 rest breaks. The pt went on to retrieve cones, while seated at w/c LOF, by crossing midline to grasping the cone from one hand and placing them into my opposite hand 3x's with 1 rest break.  The pt completed  UB exercises using a 2lb dumb bell for 1 set of 15 with her RUE for bicep curls, shld flexion and horizontal abduction. At the end of the session, the pt remained at w/c LOF with the call light and bedside table within reach.  All additional needs were addressed prior to exiting the room.     Therapy Documentation Precautions:  Precautions Precautions: Fall, Posterior Hip, Other (comment) (PWB RUE- only for walking) Precaution Booklet Issued: No Recall of  Precautions/Restrictions: Impaired Precaution/Restrictions Comments: R JP drain, RLE wound vac, MASD Required Braces or Orthoses: Splint/Cast Knee Immobilizer - Right: On at all times Splint/Cast: RUE Restrictions Weight Bearing Restrictions Per Provider Order: No RUE Weight Bearing Per Provider Order: Partial weight bearing RUE Partial Weight Bearing Percentage or Pounds: okay to use walker, confirmed with MD/ortho, okay to weight bear right UE as long as R elbow remains extended RLE Weight Bearing Per Provider Order: Weight bearing as tolerated Other Position/Activity Restrictions: posterior hip precautions   Therapy/Group: Individual Therapy  Elvera JONETTA Mace 12/20/2023, 4:31 PM

## 2023-12-20 NOTE — Progress Notes (Signed)
 Orthopedic Tech Progress Note Patient Details:  Sherri [name] 03-29-1941 [id number] Abdominal binder was delivered to bedside Ortho Devices Type of Ortho Device: Abdominal binder Ortho Device/Splint Interventions: Ordered      Massie BRAVO [name] 12/20/2023, 10:39 AM

## 2023-12-20 NOTE — Progress Notes (Signed)
 Patient ID: Sherri  [name], female   DOB: Mar 22, 1941, 83 y.o.   MRN: [id number] [facility] Progress Note   Assessment/ Plan:   1.  Hyponatremia: She appears euvolemic on physical exam.  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  No evidence of hypothyroidism or adrenal insufficiency.  Sodium level unchanged at 128 overnight and she does not have any indications for tolvaptan or hypertonic saline.  Continue efforts at control of nausea/fluid restriction and will start urea  supplementation (4 doses). 2.  Hypocalcemia: Her albumin  corrected calcium  is within normal range.  Ionized calcium  pending. 3.  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  Rheumatoid arthritis: She is currently on methotrexate  and prednisone .  No evidence of adrenal insufficiency.  Subjective:   Without acute events overnight, no chest pain or shortness of breath.   Objective:   BP (!) 151/77 (BP Location: Left Arm)   Pulse 88   Temp 98 F (36.7 C) (Oral)   Resp 14   Ht 5' (1.524 m)   Wt 50.7 kg   SpO2 95%   BMI 21.83 kg/m   Intake/Output Summary (Last 24 hours) at 12/20/2023 0830 Last data filed at 12/20/2023 0813 Gross per 24 hour  Intake 800 ml  Output 1125 ml  Net -325 ml   Weight change: -0.2 kg  Physical Exam: Gen: Comfortably sitting up in chair at bedside CVS: Pulse regular rhythm, normal rate, S1 and S2 normal Resp: Clear to auscultation bilaterally, no rales/rhonchi Abd: Soft, flat, nontender, bowel sounds normal Ext: Right thigh ulcer with use gauze dressing.  Right upper extremity in Ace wrap  Imaging: No results found.   Labs: BMET Recent Labs  Lab 12/14/23 0513 12/16/23 0428 12/17/23 0513 12/18/23 0521 12/19/23 0519 12/20/23 0612  NA 132* 130* 128* 131* 128* 128*  K 3.6 3.4* 3.7 3.7 3.4* 3.9  CL 99  95* 95* 98 98 98  CO2 19* 23 24 24  21* 22  GLUCOSE 75 75 79 81 74 96  BUN <5* 5* <5* <5* <5* 6*  CREATININE 0.49 0.62 0.56 0.52 0.46 0.52  CALCIUM  7.8* 7.6* 7.5* 7.4* 7.0* 7.8*  PHOS  --   --   --  2.2* 2.3* 2.7   CBC Recent Labs  Lab 12/14/23 0513 12/16/23 0428 12/17/23 0513  WBC 7.6 10.4 7.8  NEUTROABS 5.7  --  7.2  HGB 11.4* 9.2* 8.6*  HCT 35.0* 27.4* 25.8*  MCV 98.9 95.5 95.6  PLT 176 178 179    Medications:     vitamin C   1,000 mg Oral Daily   atorvastatin   10 mg Oral QPM   bethanechol   5 mg Oral TID   carvedilol   3.125 mg Oral BID WC   ciprofloxacin   250 mg Oral BID   Followed by   cefadroxil   500 mg Oral BID   cholecalciferol   1,000 Units Oral q AM   cyclobenzaprine   10 mg Oral QHS   DULoxetine   20 mg Oral QHS   feeding supplement  237 mL Oral BID BM   furosemide   10 mg Oral Daily   Gerhardt's butt cream   Topical TID   methotrexate   12.5 mg Oral Q Wed   mouth rinse  15 mL Mouth Rinse 4 times per day   pantoprazole   40 mg Oral BID AC   polyethylene glycol  17 g Oral BID   predniSONE   5 mg Oral Q breakfast   senna  1 tablet Oral BID   sodium chloride  flush  10 mL Intravenous Q12H   sodium chloride   1 g Oral TID WC   tamsulosin   0.4 mg Oral QPC supper   urea   15 g Oral BID   zinc  sulfate (50mg  elemental zinc )  220 mg Oral Q supper    Gordy Blanch, MD 12/20/2023, 8:30 AM

## 2023-12-20 NOTE — Progress Notes (Addendum)
 PROGRESS NOTE   Subjective/Complaints:  Pt doing well and feels well, slept ok, pain doing ok, LBM yesterday per nursing, after sorbitol , but documented at 5am. Urinating ok but also being cath'd intermittently. Nursing asking about BPs. No other complaints or concerns.   ROS: as per HPI. Denies CP, SOB, abd pain, N/V/D, or any other complaints at this time.   + chronic joint pain + bladder incontinence/retention- continued + Fatigue + Right leg pain   Objective:   No results found.   No results for input(s): WBC, HGB, HCT, PLT in the last 72 hours.   Recent Labs    12/19/23 0519 12/20/23 0612  NA 128* 128*  K 3.4* 3.9  CL 98 98  CO2 21* 22  GLUCOSE 74 96  BUN <5* 6*  CREATININE 0.46 0.52  CALCIUM  7.0* 7.8*    Intake/Output Summary (Last 24 hours) at 12/20/2023 1057 Last data filed at 12/20/2023 0813 Gross per 24 hour  Intake 800 ml  Output 1125 ml  Net -325 ml        Physical Exam: Vital Signs Blood pressure 94/79, pulse 98, temperature 98 F (36.7 C), temperature source Oral, resp. rate 14, height 5' (1.524 m), weight 50.7 kg, SpO2 95%.  General: Sitting in WC, NAD, looks well HEENT: Head is normocephalic, atraumatic, sclera anicteric, lips a little dry Neck: Supple without JVD or lymphadenopathy Heart: Reg rate and rhythm. No murmurs rubs or gallops Chest: CTA bilaterally without wheezes, rales, or rhonchi; no distress Abdomen: Soft, non-tender, non-distended, bowel sounds positive. Extremities: No significant lower extremity edema, R leg with gauze wrap Psych: calm and cooperative Skin: Thin skin, multiple skin tears and covered areas, scattered bruising.   PRIOR EXAMS: Neuro: Awake and alert, confused  at times, oriented  to person place, year, not month or day (she says August 30) , sensation intact in all 4 extremities to LT, hard of hearing   MSK:  Arthritic changes noted in her  hands RUE with splint and ace wrap R leg has drain with small amount sanguinous drainage in bulb and VAC also has small amount drainage in it Lifts bilateral upper extremities to gravity RLE- HF 3/5;  KE 4-/5, DF/PF 4/5 LLE- 5-/5 throughout   Image from 7/19   Drain site 7/24  Assessment/Plan: 1. Functional deficits which require 3+ hours per day of interdisciplinary therapy in a comprehensive inpatient rehab setting. Physiatrist is providing close team supervision and 24 hour management of active medical problems listed below. Physiatrist and rehab team continue to assess barriers to discharge/monitor patient progress toward functional and medical goals  Care Tool:  Bathing    Body parts bathed by patient: Right arm, Left upper leg, Left arm, Chest, Abdomen, Front perineal area, Right upper leg, Face   Body parts bathed by helper: Right lower leg, Left lower leg, Buttocks, Front perineal area     Bathing assist Assist Level: Moderate Assistance - Patient 50 - 74%     Upper Body Dressing/Undressing Upper body dressing   What is the patient wearing?: Pull over shirt    Upper body assist Assist Level: Set up assist    Lower Body Dressing/Undressing Lower  body dressing      What is the patient wearing?: Pants, Incontinence brief     Lower body assist Assist for lower body dressing: Maximal Assistance - Patient 25 - 49%     Toileting Toileting Toileting Activity did not occur (Clothing management and hygiene only): N/A (no void or bm)  Toileting assist Assist for toileting: Maximal Assistance - Patient 25 - 49%     Transfers Chair/bed transfer  Transfers assist     Chair/bed transfer assist level: Moderate Assistance - Patient 50 - 74%     Locomotion Ambulation   Ambulation assist   Ambulation activity did not occur: Safety/medical concerns  Assist level: Contact Guard/Touching assist Assistive device: Parallel bars Max distance: 10   Walk 10 feet  activity   Assist  Walk 10 feet activity did not occur: Safety/medical concerns  Assist level: Contact Guard/Touching assist Assistive device: Parallel bars   Walk 50 feet activity   Assist Walk 50 feet with 2 turns activity did not occur: Safety/medical concerns         Walk 150 feet activity   Assist Walk 150 feet activity did not occur: Safety/medical concerns         Walk 10 feet on uneven surface  activity   Assist Walk 10 feet on uneven surfaces activity did not occur: Safety/medical concerns         Wheelchair     Assist Is the patient using a wheelchair?: Yes Type of Wheelchair: Manual    Wheelchair assist level: Dependent - Patient 0%      Wheelchair 50 feet with 2 turns activity    Assist        Assist Level: Dependent - Patient 0%   Wheelchair 150 feet activity     Assist      Assist Level: Dependent - Patient 0%   Blood pressure 94/79, pulse 98, temperature 98 F (36.7 C), temperature source Oral, resp. rate 14, height 5' (1.524 m), weight 50.7 kg, SpO2 95%.  Medical Problem List and Plan: 1. 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 She also had R elbow hardware removal and olecranon excision and triceps advancement 11/28/23.             -patient may not  shower due to VAC             -ELOS/Goals: 12-14 days- Superviison to min A WBAT with walker RLE-posterior hip precautions Partial weightbearing RUE okay to use walker per Dr. Edna- s/p R elbow hardware removal - Message ortho to confirm wt bearing use with walker- OK for weightbearing but no bending elbow-discussed with Dr. Edna, appreciate assistance.  Okay to remove splint when thermoplastic splint is ready.  Okay to remove this for wound care and hygiene otherwise needs to stay on.  -Continue CIR - Patient was seen by Kemp Hila Cockerham-possible suture removal Monday, plan for right upper  extremity splint to be made today 12/11/2023 -Addendum: social work note indicates step-grandson committed suicide  - Patient seen by orthopedics  7/21 -15/7 schedule -Expected 7/30   - Patient was seen by surgery-chart review indicates JP drain was removed, appears plan for continued wound VAC with change to Prevena on discharge. follow-up in [facility] office 7 days after discharge for possible removal  2.  Antithrombotics: -DVT/anticoagulation:  Mechanical:  Antiembolism stockings, knee (TED hose) Bilateral lower extremities Sequential compression devices, below knee Bilateral lower extremities             -  antiplatelet therapy: Aspirin  81 mg held due to bleeding risk  3. Pain Management: Oxycodone  and Tylenol  as needed             - Continue Cymbalta  20 mg daily and Flexeril  10 mg at bedtime 4. Mood/Behavior/Sleep: ego support             -antipsychotic agents: N/A 5. Neuropsych/cognition: This patient is intermittently capable of making decisions on her own behalf.  6. Skin/Wound Care: Routine pressure relief measures             -JP drain-per Ortho will discontinue when output < 10 cc per shift             -Continue negative pressure at 75 mmHg--can be exchanged to Prevena  - MASD-7/15 start Diflucan  200 mg then 100 mg x 6 days -Gerhardt's  -Diffuse ecchymoses- Platelets 330 CTM Routine labs, follow-up with derm/hematology outpatient. -ID pharm recommending 4 weeks abx from time of OR for elbow 8/2, finish cipro  for UTI then switch back to cefadroxil , CRP is elevated  7. Fluids/Electrolytes/Nutrition: Monitor I's and O's, routine labs             -Diet Heart Healthy-- SLP recc nectar thick- pt not agreeable  -Ensure High protein + Zinc  and vitamin C    8. Hypertension/ intermittent mild tachycardia: Continue Coreg  3.125 mg BID,              -7/18-26 BP controlled, continue to monitor -12/20/23 BPs a little soft this morning-- advised using Abd binder and LLE TED while  sitting/standing, because BP laying down is 150s/70s this morning-- if still soft then can hold Coreg  this morning, but if better then still give Coreg . Nursing aware.    Vitals:   12/17/23 1354 12/17/23 1948 12/18/23 0411 12/18/23 1310  BP: (!) 107/58 127/80 136/61 105/62   12/18/23 1955 12/19/23 0459 12/19/23 1002 12/19/23 1507  BP: (!) 109/55 127/76 104/65 104/64   12/19/23 1931 12/20/23 0518 12/20/23 0800 12/20/23 1036  BP: (!) 106/59 (!) 151/77 (!) 84/55 94/79      9. Rheumatoid arthritis-continue home prednisone  5 mg daily and methotrexate  12.5mg  weekly- methotrexate  just restarted.   10. Microcytic anemia/acute blood loss anemia: monitor for signs of bleeding recheck CBC in a.m   -normal folate & B12, iron study suggestive of AOC, status post 3 units PRBC. 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 Constipation- senna 8.6mg  BID  - 7/16 reports improvement after multiple bowel movements yesterday  -7/17 LMB 7/15, continue to monitor  -7/18 miralrax daily, PRN sorbitol  -7/24 LBM documented 7/22, will increase to BID, consider additional medication tomorrow if no BM -12/19/23 pt stating LBM yesterday but not documented. Having nursing give PRN sorbitol  today to see if she has a BM today.  -12/20/23 LBM this morning or last night, after sorbitol ; monitor.   13. Dysphagia, chronic  -Prior SLP eval re nectar thick, she has been refusing modification/nectar thick liquids -SLP has been consulted- she continues to decline interventions  14. Hypokalemia/Hyponatremia  -7/17 potassium x2 today, recheck tomorrow 7/18 K+ up to 4.5, NA lower at 128  -Check urine sodium/osmolality due to hyponatremia, asymptomatic,  Cr higher then her baseline at 0.8 today, HGB higher on 7/15 and 7/17 think she is a little fluid depleted, add NS 50ml/hr and monitor response  -Cr 0.57, Na 131,  K 3.7. will  stop fluids today and recheck Monday. Urine studies not done yet.  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  On salt tabs and furosemide  and protein supplement was started by nephrology.  Sodium a little higher at 131 today -12/19/23 Na 128, appreciate nephro support, recommending fluid restriction. Monitor. K 3.4, stable, receiving Klor 20mEq BID -12/20/23 Na 128 again today, renal starting urea  x4 doses (BID x2d), still on lasix  and salt tabs; monitor  15. Nausea  -compazine  PRN -7/19 improved today, discussed with pharmacy to try to adjust medication timing.  Nursing order to try to do medications after breakfast   16. HLD: continue atorvastatin  10mg  nightly.   17. Overactive Bladder- cont Oxybutynin  5mg  nightly -12/13/23 pt now having urinary retention requiring caths. Will stop Oxybutinin, monitor off it for now, if still having retention then would get U/A and consider adding urecholine   -7/21 will add Flomax , check UA  -7/22 volumes appear little better, start Macrobid   -7/23 will ask nursing continue to check bladder scans/PVR  -7/25 will try low dose urocholine 5mg  TID for retention  -7/26-27/25 still needing cathing, monitor for now since just started meds 18. Suspected UTI -7/22 Increased retention, potentially a little more confused- suspect UTI Discussed with ID pharm, start macrobid - appreciate assistance. Follow cultures -7/23 ABX changed to cipro  250mg  BID was growing pseudomonas, cultures indicate it is sensitive-- followed by duricef to complete that course    I spent >67mins performing patient care related  activities, including face to face time, documentation time, lab review/med management, discussion of BPs/labs/meds with patient and nursing staff, and overall coordination of care.   LOS: 12 days A FACE TO FACE EVALUATION WAS PERFORMED  [address] 12/20/2023, 10:57 AM

## 2023-12-20 NOTE — Progress Notes (Addendum)
 Physical Therapy Session Note  Patient Details  Name: Sherri [name] MRN: [id number] Date of Birth: 1940-12-06  Today's Date: 12/20/2023 PT Individual Time: 0800-0857, 1400-1440 PT Individual Time Calculation (min): 57 min, 40 min  Short Term Goals: Week 2:  PT Short Term Goal 1 (Week 2): STG=LTG 2/2 ELOS  Skilled Therapeutic Interventions/Progress Updates:      Treatment Session 1  Pt supine in bed upon arrival. Pt agreeable to therapy. Pt denies any pain. Pt endorses legs feeling mcuh stronger today with mobility.   Donned hearing aids with total A B.   Supine to sit with use of bed features (bed rails and HOB raised to 48 degrees and min A, PT encouraging pt to do as much as she can, verbal and tactile cues provided for UE positioning.   Pt performed sit to stand from elevated bed (for maintenance of posterior hip precautions with verbal cues provided for UE positioning and R LE positioning for correction of R LE hip abduction and R knee valgus. Pt demonstrates improved technique with R UE on Rw and L UE push up from surface pt standing from.   Pt performed stand pivot transfer to L with RW and light min A, for management of RW, verbal cues provided to not sit until back of legs touching wheelchair.   Pt seated in WC, brushed hair with min A to reach back of head.  Pt performed sit to stand from [facility] with RW and CGA, verbal cues provided for UE positioning.   Pt ambulated x3 feet with RW and CGA with WC in tow, prior to pt sitting 2/2 feeling dizzy and hazy.   Vitals assessed:   Seated in WC BP 93/58 HR 101  Seated post donning L LE ted hose, and performing seated therex: 84/55 (nurse present and aware).    Pt has current order to donn ted hose B, however PT/nurse concerned about donning ted hose to R LE 2/2 skin tears and dressings. Notified nurse. Donned L LE ted hose. PA ordering abdominal binder, plan to trial next session.   Pt performed the following therex for  hemodynamic stability:   1x10 LAQ B  1x10 ankle pumps B  1x10 seated hip abduction   Pt agreeable to sit in WC between sessions. Pt seated in Wc with all needs within reach and seatbelt alarm on. Nurse aware of pt position. Telesitter in place.   Treatment Session 2   Pt supine asleep in bed upon arrival. Pt agreeable to therapy. Pt reports R knee and thigh pain, therapist provided rest breaks and repositioning and notified nurse.   Donned abdominal binder.   Pt performed sit to stand throughout session with RW and CGA. Pt performed stand pivot trnasfer throughout session with Rw and min A progressing to CGA, verbal cues provided intermittently not to sit until B LE touching surface pt sitting on. Pt performed stand pivot transfer throughout session x4 to R and x4 to L.   Pt ambulated 10 feet with RW and CGA with close WC follow, pt demonstrates significant R LE external rotation with step to gait and narrow BOS, verbal cues provided for correction. Attempted additional ambulation trial however pt self discontinued 2/2 fatigue.   Sit to supine with min A for management of R LE.   Pt requesting diaper rash cream at end of session. Pt also needing R LE dressing change. Notified nurse. Nurse present at end of session.      Therapy Documentation Precautions:  Precautions Precautions: Fall, Posterior Hip, Other (comment) (PWB RUE- only for walking) Precaution Booklet Issued: No Recall of Precautions/Restrictions: Impaired Precaution/Restrictions Comments: R JP drain, RLE wound vac, MASD Required Braces or Orthoses: Splint/Cast Knee Immobilizer - Right: On at all times Splint/Cast: RUE Restrictions Weight Bearing Restrictions Per Provider Order: No RUE Weight Bearing Per Provider Order: Partial weight bearing RUE Partial Weight Bearing Percentage or Pounds: okay to use walker, confirmed with MD/ortho, okay to weight bear right UE as long as R elbow remains extended RLE Weight Bearing  Per Provider Order: Weight bearing as tolerated Other Position/Activity Restrictions: posterior hip precautions    Therapy/Group: Individual Therapy  [facility] Sherri [name], Sherri [name], DPT  12/20/2023, 7:41 AM

## 2023-12-21 LAB — CBC WITH DIFFERENTIAL/PLATELET
Abs Immature Granulocytes: 0.41 K/uL — ABNORMAL HIGH (ref 0.00–0.07)
Basophils Absolute: 0 K/uL (ref 0.0–0.1)
Basophils Relative: 1 %
Eosinophils Absolute: 0 K/uL (ref 0.0–0.5)
Eosinophils Relative: 1 %
HCT: 27.5 % — ABNORMAL LOW (ref 36.0–46.0)
Hemoglobin: 9.1 g/dL — ABNORMAL LOW (ref 12.0–15.0)
Immature Granulocytes: 10 %
Lymphocytes Relative: 15 %
Lymphs Abs: 0.6 K/uL — ABNORMAL LOW (ref 0.7–4.0)
MCH: 31.3 pg (ref 26.0–34.0)
MCHC: 33.1 g/dL (ref 30.0–36.0)
MCV: 94.5 fL (ref 80.0–100.0)
Monocytes Absolute: 0.5 K/uL (ref 0.1–1.0)
Monocytes Relative: 11 %
Neutro Abs: 2.6 K/uL (ref 1.7–7.7)
Neutrophils Relative %: 62 %
Platelets: 162 K/uL (ref 150–400)
RBC: 2.91 MIL/uL — ABNORMAL LOW (ref 3.87–5.11)
RDW: 17.9 % — ABNORMAL HIGH (ref 11.5–15.5)
WBC: 4.1 K/uL (ref 4.0–10.5)
nRBC: 0 % (ref 0.0–0.2)

## 2023-12-21 LAB — RENAL FUNCTION PANEL
Albumin: 1.9 g/dL — ABNORMAL LOW (ref 3.5–5.0)
Anion gap: 9 (ref 5–15)
BUN: 14 mg/dL (ref 8–23)
CO2: 24 mmol/L (ref 22–32)
Calcium: 8.1 mg/dL — ABNORMAL LOW (ref 8.9–10.3)
Chloride: 96 mmol/L — ABNORMAL LOW (ref 98–111)
Creatinine, Ser: 0.68 mg/dL (ref 0.44–1.00)
GFR, Estimated: >60 mL/min (ref >60)
Glucose, Bld: 80 mg/dL (ref 70–99)
Phosphorus: 3.3 mg/dL (ref 2.5–4.6)
Potassium: 3.4 mmol/L — ABNORMAL LOW (ref 3.5–5.1)
Sodium: 129 mmol/L — ABNORMAL LOW (ref 135–145)

## 2023-12-21 MED ORDER — POTASSIUM CHLORIDE CRYS ER 20 MEQ PO TBCR
30.0000 meq | EXTENDED_RELEASE_TABLET | Freq: Once | ORAL | Status: AC
  Administered 2023-12-21: 30 meq via ORAL
  Filled 2023-12-21: qty 1

## 2023-12-21 MED ORDER — CHLORHEXIDINE GLUCONATE CLOTH 2 % EX PADS
6.0000 | MEDICATED_PAD | Freq: Every day | CUTANEOUS | Status: DC
  Administered 2023-12-21: 6 via TOPICAL

## 2023-12-21 NOTE — Progress Notes (Signed)
 Patient ID: Sherri [name]  DELENA Glow, female   DOB: May 11, 1941, 83 y.o.   MRN: [id number] Supplies for dressing change to skin tear given to patient for discharge.  Fredericka Barnie NOVAK

## 2023-12-21 NOTE — Discharge Instructions (Signed)
 Inpatient Rehab Discharge Instructions  Sherri  [name] Discharge date and time: No discharge date for patient encounter.   Activities/Precautions/ Functional Status: Activity: no lifting, driving, or strenuous exercise for until cleared by MD.  Diet: regular diet Wound Care: keep wound clean and dry, as directed, reinforce dressing PRN, and routine catheter care Functional status:  ___ No restrictions     ___ Walk up steps independently ___ 24/7 supervision/assistance   ___ Walk up steps with assistance ___ Intermittent supervision/assistance  ___ Bathe/dress independently _X__ Walk with walker     ___ Bathe/dress with assistance ___ Walk Independently    ___ Shower independently ___ Walk with assistance    __X_ Shower with assistance _X__ No alcohol      ___ Return to work/school ________  Special Instructions:  Wound care   Foley  My questions have been answered and I understand these instructions. I will adhere to these goals and the provided educational materials after my discharge from the hospital.  Patient/Caregiver Signature _______________________________ Date __________  Clinician Signature _______________________________________ Date __________  Please bring this form and your medication list with you to all your follow-up doctor's appointments.    COMMUNITY REFERRALS UPON DISCHARGE:    Home Health:   PT    OT    RN                    Agency: [facility] Phone: [phone]     Medical Equipment/Items Ordered:HAS ALL EQUIPMENT FROM PREVIOUS ADMISSIONS                                                 Agency/Supplier:NA  TRANSPORTATION RESOURCES GIVEN TO DAUGHTER FOR FOLLOW UP APPOINTMENTS   RESUME HER 24/7 CARE VIA [facility]MARK

## 2023-12-21 NOTE — Progress Notes (Signed)
 PROGRESS NOTE   Subjective/Complaints: Patient reports she is feeling well this morning.  She reports pain is under control.  She is happy because she is scheduled to go home soon.  Has family training scheduled for today.  ROS: as per HPI. Denies CP, SOB, abd pain, N/V/D, HA  + bladder incontinence/retention- continued + Fatigue + Right leg pain   Objective:   No results found.   Recent Labs    12/21/23 0535  WBC 4.1  HGB 9.1*  HCT 27.5*  PLT 162     Recent Labs    12/20/23 0612 12/21/23 0535  NA 128* 129*  K 3.9 3.4*  CL 98 96*  CO2 22 24  GLUCOSE 96 80  BUN 6* 14  CREATININE 0.52 0.68  CALCIUM  7.8* 8.1*    Intake/Output Summary (Last 24 hours) at 12/21/2023 1305 Last data filed at 12/21/2023 1256 Gross per 24 hour  Intake 400 ml  Output 1100 ml  Net -700 ml        Physical Exam: Vital Signs Blood pressure (!) 150/69, pulse 90, temperature 98 F (36.7 C), resp. rate 18, height 5' (1.524 m), weight 49.3 kg, SpO2 94%.  General: Sitting in WC, NAD, laying in bed HEENT: Head is normocephalic, atraumatic, sclera anicteric, lips a little dry Neck: Supple without JVD or lymphadenopathy Heart: Reg rate and rhythm. No murmurs rubs or gallops Chest: CTA bilaterally without wheezes, rales, or rhonchi; no distress Abdomen: Soft, non-tender, non-distended, bowel sounds positive. Extremities: No significant lower extremity edema, R leg with gauze wrap Psych: calm and cooperative Skin: Thin skin, multiple skin tears and covered areas, scattered bruising.   Neuro: Awake and alert, confused  at times, oriented  to person place, year, month.  She is 1 day off on the day of the month, sensation intact in all 4 extremities to LT, hard of hearing   MSK:  Arthritic changes noted in her hands RUE with splint and ace wrap R leg has been removed,  VAC also has small amount drainage in it Lifts bilateral upper  extremities to gravity RLE- HF 3/5;  KE 4-/5, DF/PF 4/5 LLE- 5-/5 throughout   Image from 7/19  Assessment/Plan: 1. Functional deficits which require 3+ hours per day of interdisciplinary therapy in a comprehensive inpatient rehab setting. Physiatrist is providing close team supervision and 24 hour management of active medical problems listed below. Physiatrist and rehab team continue to assess barriers to discharge/monitor patient progress toward functional and medical goals  Care Tool:  Bathing    Body parts bathed by patient: Right arm, Left upper leg, Left arm, Chest, Abdomen, Front perineal area, Right upper leg, Face   Body parts bathed by helper: Right lower leg, Left lower leg, Buttocks, Front perineal area     Bathing assist Assist Level: Moderate Assistance - Patient 50 - 74%     Upper Body Dressing/Undressing Upper body dressing   What is the patient wearing?: Pull over shirt    Upper body assist Assist Level: Set up assist    Lower Body Dressing/Undressing Lower body dressing      What is the patient wearing?: Pants, Incontinence brief  Lower body assist Assist for lower body dressing: Maximal Assistance - Patient 25 - 49%     Toileting Toileting Toileting Activity did not occur (Clothing management and hygiene only): N/A (no void or bm)  Toileting assist Assist for toileting: Maximal Assistance - Patient 25 - 49%     Transfers Chair/bed transfer  Transfers assist     Chair/bed transfer assist level: Moderate Assistance - Patient 50 - 74%     Locomotion Ambulation   Ambulation assist   Ambulation activity did not occur: Safety/medical concerns  Assist level: Contact Guard/Touching assist Assistive device: Parallel bars Max distance: 10   Walk 10 feet activity   Assist  Walk 10 feet activity did not occur: Safety/medical concerns  Assist level: Contact Guard/Touching assist Assistive device: Parallel bars   Walk 50 feet  activity   Assist Walk 50 feet with 2 turns activity did not occur: Safety/medical concerns         Walk 150 feet activity   Assist Walk 150 feet activity did not occur: Safety/medical concerns         Walk 10 feet on uneven surface  activity   Assist Walk 10 feet on uneven surfaces activity did not occur: Safety/medical concerns         Wheelchair     Assist Is the patient using a wheelchair?: Yes Type of Wheelchair: Manual    Wheelchair assist level: Dependent - Patient 0%      Wheelchair 50 feet with 2 turns activity    Assist        Assist Level: Dependent - Patient 0%   Wheelchair 150 feet activity     Assist      Assist Level: Dependent - Patient 0%   Blood pressure (!) 150/69, pulse 90, temperature 98 F (36.7 C), resp. rate 18, height 5' (1.524 m), weight 49.3 kg, SpO2 94%.  Medical Problem List and Plan: 1. 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 She also had R elbow hardware removal and olecranon excision and triceps advancement 11/28/23.             -patient may not  shower due to VAC             -ELOS/Goals: 12-14 days- Superviison to min A WBAT with walker RLE-posterior hip precautions Partial weightbearing RUE okay to use walker per Dr. Edna- s/p R elbow hardware removal - Message ortho to confirm wt bearing use with walker- OK for weightbearing but no bending elbow-discussed with Dr. Edna, appreciate assistance.  Okay to remove splint when thermoplastic splint is ready.  Okay to remove this for wound care and hygiene otherwise needs to stay on.  -Continue CIR - Patient was seen by Kemp Hila Cockerham-possible suture removal Monday, plan for right upper extremity splint to be made today 12/11/2023 -Addendum: social work note indicates step-grandson committed suicide  - Patient seen by orthopedics  7/21 -15/7 schedule -Expected 7/30   - Patient was seen  by surgery-JP removed, appears plan for continued wound VAC with change to Prevena on discharge. follow-up in [facility] office 7 days after discharge for possible removal -Family training today  2.  Antithrombotics: -DVT/anticoagulation:  Mechanical:  Antiembolism stockings, knee (TED hose) Bilateral lower extremities Sequential compression devices, below knee Bilateral lower extremities             -antiplatelet therapy: Aspirin  81 mg held due to bleeding risk  3. Pain Management: Oxycodone   and Tylenol  as needed             - Continue Cymbalta  20 mg daily and Flexeril  10 mg at bedtime 4. Mood/Behavior/Sleep: ego support             -antipsychotic agents: N/A 5. Neuropsych/cognition: This patient is intermittently capable of making decisions on her own behalf.  6. Skin/Wound Care: Routine pressure relief measures             -JP drain-per Ortho will discontinue when output < 10 cc per shift             -Continue negative pressure at 75 mmHg--can be exchanged to Prevena  - MASD-7/15 start Diflucan  200 mg then 100 mg x 6 days -Gerhardt's  -Diffuse ecchymoses- Platelets 330 CTM Routine labs, follow-up with derm/hematology outpatient. -ID pharm recommending 4 weeks abx from time of OR for elbow 8/2, finish cipro  for UTI then switch back to cefadroxil , CRP is elevated  7. Fluids/Electrolytes/Nutrition: Monitor I's and O's, routine labs             -Diet Heart Healthy-- SLP recc nectar thick- pt not agreeable  -Ensure High protein + Zinc  and vitamin C    8. Hypertension/ intermittent mild tachycardia: Continue Coreg  3.125 mg BID,              -7/18-26 BP controlled, continue to monitor -12/20/23 BPs a little soft this morning-- advised using Abd binder and LLE TED while sitting/standing, because BP laying down is 150s/70s this morning-- if still soft then can hold Coreg  this morning, but if better then still give Coreg . Nursing aware.  -7/28 BP stable, continue current regimen    Vitals:    12/19/23 0459 12/19/23 1002 12/19/23 1507 12/19/23 1931  BP: 127/76 104/65 104/64 (!) 106/59   12/20/23 0518 12/20/23 0800 12/20/23 1036 12/20/23 1126  BP: (!) 151/77 (!) 84/55 94/79 105/61   12/20/23 1345 12/20/23 1731 12/20/23 2002 12/21/23 0443  BP: 123/67 114/67 122/70 (!) 150/69      9. Rheumatoid arthritis-continue home prednisone  5 mg daily and methotrexate  12.5mg  weekly- methotrexate  just restarted.   10. Microcytic anemia/acute blood loss anemia: monitor for signs of bleeding recheck CBC in a.m   -normal folate & B12, iron study suggestive of AOC, status post 3 units PRBC. 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 Constipation- senna 8.6mg  BID  - 7/16 reports improvement after multiple bowel movements yesterday  -7/17 LMB 7/15, continue to monitor  -7/18 miralrax daily, PRN sorbitol  -7/24 LBM documented 7/22, will increase to BID, consider additional medication tomorrow if no BM -12/19/23 pt stating LBM yesterday but not documented. Having nursing give PRN sorbitol  today to see if she has a BM today.  -12/20/23 LBM this morning or last night, after sorbitol ; monitor.  -7/28 LBM yesterday continue to monitor  13. Dysphagia, chronic  -Prior SLP eval re nectar thick, she has been refusing modification/nectar thick liquids -SLP has been consulted- she continues to decline interventions  14. Hypokalemia/Hyponatremia  -7/17 potassium x2 today, recheck tomorrow 7/18 K+ up to 4.5, NA lower at 128  -Check urine sodium/osmolality due to hyponatremia, asymptomatic,  Cr higher then her baseline at 0.8 today, HGB higher on 7/15 and 7/17 think she is a little fluid depleted, add NS 50ml/hr and monitor response  -Cr 0.57, Na 131, K 3.7. will  stop fluids today and recheck Monday. Urine studies not done yet.  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  On salt tabs and furosemide  and protein supplement was started by nephrology.  Sodium a little higher at 131 today -12/19/23 Na 128, appreciate nephro support, recommending fluid restriction. Monitor. K 3.4, stable, receiving Klor 20mEq BID -12/20/23 Na 128 again today, renal starting urea  x4 doses (BID x2d), still on lasix  and salt tabs; monitor -7/28 order 30 mEq potassium repletion, sodium a little better at 129 today  15. Nausea  -compazine  PRN -7/19 improved today, discussed with pharmacy to try to adjust medication timing.  Nursing order to try to do medications after breakfast   16. HLD: continue atorvastatin  10mg  nightly.   17. Overactive Bladder- cont Oxybutynin  5mg  nightly -12/13/23 pt now having urinary retention requiring caths. Will stop Oxybutinin, monitor off it for now, if still having retention then would get U/A and consider adding urecholine   -7/21 will add Flomax , check UA  -7/22 volumes appear little better, start Macrobid   -7/23 will ask nursing continue to check bladder scans/PVR  -7/25 will try low dose urocholine 5mg  TID for retention  -7/26-27/25 still needing cathing, monitor for now since just started meds -7/28 family and patient unable to do IC, will start Foley follow-up with urology 18. Suspected UTI -7/22 Increased retention, potentially a little more confused- suspect UTI Discussed with ID pharm, start macrobid - appreciate assistance. Follow cultures -7/23 ABX changed to cipro  250mg  BID was growing pseudomonas, cultures indicate it is sensitive-- followed by duricef to complete that course   LOS: 13 days A FACE TO FACE  EVALUATION WAS PERFORMED  Murray Collier 12/21/2023, 1:05 PM

## 2023-12-21 NOTE — Progress Notes (Signed)
 Physical Therapy Session Note  Patient Details  Name: Sherri [name]  KHALEA VENTURA MRN: [id number] Date of Birth: [birth year]  Today's Date: 12/21/2023 PT Individual Time: 1113-1206, 8567-8472 PT Individual Time Calculation (min): 53 min, 55 min  Short Term Goals: Week 2:  PT Short Term Goal 1 (Week 2): STG=LTG 2/2 ELOS  Skilled Therapeutic Interventions/Progress Updates:      Treatment Session 1  Pt seated in WC upon arrival. Pt agreeable to therapy. Pt denies any pain.   Pt son and daughter present for family training. Education provided regarding pt impairments and need for RW and min A for sit to stand/stand pivot transfer.   Extensive education provided to pt and pt family regarding posterior hip precautions (purpose, implementation) with increased emphasis on reducing risk of recurrent dislocation.   Extensive education provided regarding pt recently (as of last two days) being consistently min A with stand pivot transfer with RW, however last week level of assist fluctuating with min-max A with pt pain/fatigue/pt attempting to sit prematurely. Followed up with pt son regarding purchasing knock of steady. Pt and pt family opting not to purchase steady transfer.   Pt provided demonstration of stand pivot transfer WC <> to bed with emphasis on pt standing from raised surface (hips higher than knees), and pt not sitting until back of both legs touching the surface. Pt and son returned demonstrated. Pt daughter felt very comfortable with the transfer and did not feel the need to practice, but verbalized understanding/provided verbal teach back of precautions/sequencing/appropriate cuing for pt.   Pt family endorses, prior to this most recent hospital stay pt was very rarely getting OOB, and when she did it was max A bear hug transfer 2/2 R LE NWBing precautions. Pt family is very pleased with progress pt has made with stand pivot transfer.   Pt performed stand pivot transfer to car simulator with RW  and min A. Pt required min A for lifting R LE Into car. Education provided for reclining seat of WC for maintenance of posterior hip precuations.   Pt family confirms pt has all DME needed--however pt daughter reports pt bed and car are high and each require yoga step for entry. Pt daughter demonstrates (with therapist as pt) doing bear hug to do stand pivot with L LE on box step. PT expressed safety concerns with this technique with pt fall risk, and risk of recurrent dislocation. Discussed possibility of removing legs of bed/mattress topper or box spring as potential options to reduce height of bed. Pt son endorsed ability to remove legs of bed. Pt son planning to measure bed and car and contact PT prior to next session. Recommended social worker of likely need for non emergency medical transport home.   Pt family confirms pt home is WC accessible with exception of bathroom. Recommended use of BSC versus ambulation into bathroom 2/2 pt gait distance inconsistent with pain/fatigue, and pt sitting without warning with pain/fatigue. Recommended pt continue to progress gait with follow up HHPT.   Pt seated in WC at end of session with all needs within reach and chair alarm on.   Treatment Session 2   Pt seated in WC upon arrival. Pt agreeable to therapy. Pt reports R LE pain at end of session, requesting pain medicine, notified nurse, nurse present at end of session to administer. Therapist provided rest breaks and repositoining as needed for pain.   PT managing foley and wound vac throughout session.   Pt son reports bed is currently 17  inches high but the foot of the bed  can be removed to make the bed 24 inches high. Therapist set bed to 27 inches high, and agrees it is too high for pt to get in without step. Trailed with bed at 24 inches and pt able to performed stand pivot transfer WC<>bed with CGA. Notified pt son, pt son plans to make the adjustment.   Pt lowest available car is 31 inches high  (from ground to top of the seat). Notified Child psychotherapist of need for non emergency medical transport home. Pt daughter also asking for resources for transportation to/from follow up appointments and hair salon. Notified Child psychotherapist, Child psychotherapist present to provide these resources.   Pt ambulated 1x10, 1x12 feet (with pt performing ambulatory transfer to bed) with RW and CGA/min A (with WC in tow and therapist managing wound vac; pt demonstrates imrpoved step length B, verbal cues provided for safety with ambualtory transfer.   Pt performed stand pivot transfer throughout session with RW and CGA.   Pt seated in [facility] with all needs within reach and seatbelt alarm on.       Therapy Documentation Precautions:  Precautions Precautions: Fall, Posterior Hip, Other (comment) (PWB RUE- only for walking) Precaution Booklet Issued: No Recall of Precautions/Restrictions: Impaired Precaution/Restrictions Comments: R JP drain, RLE wound vac, MASD Required Braces or Orthoses: Splint/Cast Knee Immobilizer - Right: On at all times Splint/Cast: RUE Restrictions Weight Bearing Restrictions Per Provider Order: No RUE Weight Bearing Per Provider Order: Partial weight bearing RUE Partial Weight Bearing Percentage or Pounds: okay to use walker, confirmed with MD/ortho, okay to weight bear right UE as long as R elbow remains extended RLE Weight Bearing Per Provider Order: Weight bearing as tolerated Other Position/Activity Restrictions: posterior hip precautions  Therapy/Group: Individual Therapy  [facility] [name], [place], DPT  12/21/2023, 12:48 PM

## 2023-12-21 NOTE — Progress Notes (Signed)
 Occupational Therapy Session Note  Patient Details  Name: Sherri  DECKLYN [name] MRN: [id number] Date of Birth: 03-May-1941  Today's Date: 12/21/2023 OT Individual Time: 1000-1045 OT Individual Time Calculation (min): 45 min    Short Term Goals: Week 2:  OT Short Term Goal 1 (Week 2): STG=LTG d.t ELOs  Skilled Therapeutic Interventions/Progress Updates:      Therapy Documentation Precautions:  Precautions Precautions: Fall, Posterior Hip, Other (comment) (PWB RUE- only for walking) Precaution Booklet Issued: No Recall of Precautions/Restrictions: Impaired Precaution/Restrictions Comments: R JP drain, RLE wound vac, MASD Required Braces or Orthoses: Splint/Cast Knee Immobilizer - Right: On at all times Splint/Cast: RUE Restrictions Weight Bearing Restrictions Per Provider Order: No RUE Weight Bearing Per Provider Order: Partial weight bearing RUE Partial Weight Bearing Percentage or Pounds: okay to use walker, confirmed with MD/ortho, okay to weight bear right UE as long as R elbow remains extended RLE Weight Bearing Per Provider Order: Weight bearing as tolerated Other Position/Activity Restrictions: posterior hip precautions General: Pt supine in bed upon OT arrival, agreeable to OT session. Family present for family ed.  Pain: no pain reported  ADL: Patient, pt care partners and Camie OT present at family education. Pt and care partner educated on hip precautions (pt able to verbally state all precautions), functional level of assistance for ADLs and functional mobility/transfers and fall prevention. Pt educated specifically on CARE tool levels of assistance provided at current status and level of assistance for OT goals once at D/C. OT providing hands on training for transfers (OOB, commode) and providing education on pt's fluctuation when increased pain/fatigue. Family had questions regarding I&O cathing, OT answering I&O process/schedule, OT messaged MD for further clarification. OT  also educating on managing wound vac during mobility.    Pt seated in W/C at end of session with W/C alarm donned, call light within reach and 4Ps assessed.    Therapy/Group: Individual Therapy  Camie Hoe, OTD, OTR/L 12/21/2023, 4:19 PM

## 2023-12-21 NOTE — Progress Notes (Addendum)
 Patient ID: Sherri  [name], female   DOB: 1940/08/28, 83 y.o.   MRN: [id number] Met with pt, daughter and PT_Kyneligh regarding need for non-emergency ambulance home due to step needed to get into the vehicles they have. Will get transport number for daughter for follow up appointments and for pt to get her hair done. Daughter concerned has not seen ortho since pt came to rehab and would like for him to come by prior to DC home on Wednesday. Another concern is will the prevena work for home or does she need the larger wound vac. Have messaged MD and team regarding daughter's concerns. [employer] the private duty agency is aware of pt's discharge date along with [facility].  1;12 PM According to MD prevana will be placed on and then will need to follow up within 7 days to ortho office

## 2023-12-21 NOTE — Plan of Care (Signed)
 Foley placed for discharge for urinary retention.

## 2023-12-21 NOTE — Plan of Care (Signed)
  Problem: Consults Goal: RH GENERAL PATIENT EDUCATION Description: See Patient Education module for education specifics. Outcome: Progressing   Problem: RH BOWEL ELIMINATION Goal: RH STG MANAGE BOWEL WITH ASSISTANCE Description: STG Manage Bowel with mod I Assistance. Outcome: Progressing Goal: RH STG MANAGE BOWEL W/MEDICATION W/ASSISTANCE Description: STG Manage Bowel with Medication with mod I Assistance. Outcome: Progressing   Problem: RH BLADDER ELIMINATION Goal: RH STG MANAGE BLADDER WITH ASSISTANCE Description: STG Manage Bladder With mod I Assistance Outcome: Progressing Goal: RH STG MANAGE BLADDER WITH MEDICATION WITH ASSISTANCE Description: STG Manage Bladder With Medication With mod I Assistance. Outcome: Progressing   Problem: RH SKIN INTEGRITY Goal: RH STG SKIN FREE OF INFECTION/BREAKDOWN Description: Manage skin w min assist Outcome: Progressing Goal: RH STG MAINTAIN SKIN INTEGRITY WITH ASSISTANCE Description: STG Maintain Skin Integrity With min  Assistance. Outcome: Progressing   Problem: RH SAFETY Goal: RH STG ADHERE TO SAFETY PRECAUTIONS W/ASSISTANCE/DEVICE Description: STG Adhere to Safety Precautions With cues Assistance/Device. Outcome: Progressing   Problem: RH PAIN MANAGEMENT Goal: RH STG PAIN MANAGED AT OR BELOW PT'S PAIN GOAL Description: Pain < 4 with prns Outcome: Progressing   Problem: RH KNOWLEDGE DEFICIT GENERAL Goal: RH STG INCREASE KNOWLEDGE OF SELF CARE AFTER HOSPITALIZATION Description: Patient and family will be able to manage care at discharge using educational resources for medications and dietary modification with skin care independently Outcome: Progressing

## 2023-12-21 NOTE — Progress Notes (Signed)
 Occupational Therapy Session Note  Patient Details  Name: Sherri [name] MRN: [id number] Date of Birth: 1940/12/06  Today's Date: 12/21/2023 OT Individual Time: 9199-9155 OT Individual Time Calculation (min): 44 min    Short Term Goals: Week 1:  OT Short Term Goal 1 (Week 1): patient will demonstrate increased static standing balance to complete LB ADLs OT Short Term Goal 1 - Progress (Week 1): Met OT Short Term Goal 2 (Week 1): patient will demo increase in use of RUE as stabilizer to increase ADL participation OT Short Term Goal 2 - Progress (Week 1): Met OT Short Term Goal 3 (Week 1): patient will demo ability to don pants mod A with AE. OT Short Term Goal 3 - Progress (Week 1): Met  Skilled Therapeutic Interventions/Progress Updates:  Pt greeted supine in bed, pt agreeable to OT intervention.      Transfers/bed mobility/functional mobility:  Pt completed supine>sit with light MIN A to elevate trunk. Pt able to state 2/3 hip precautions. Pt completed sit>stand from EOB with CGA. Stand pivot to w/c with RW and CGA.   Therapeutic activity:  Attempted to work on on short distance functional ambulation task in gym however pt reports increased weakness in BLEs in standing needing to sit down, BP 105/63( 77) HR 90 in sitting with ABD binder donned.   ADLs:  Grooming: pt completed oral care and hair brushing at sink with set- up assist from sitting. UB dressing: pt donned OH shirt with MIN A d/t cast on RUE LB dressing: pt donned pants with MAX A with no recollection of how to use reacher Footwear: donned shoes with MAX A  Ended session with pt supine in bed with all needs within reach and bed alarm activated.                    Therapy Documentation Precautions:  Precautions Precautions: Fall, Posterior Hip, Other (comment) (PWB RUE- only for walking) Precaution Booklet Issued: No Recall of Precautions/Restrictions: Impaired Precaution/Restrictions Comments: R JP drain, RLE  wound vac, MASD Required Braces or Orthoses: Splint/Cast Knee Immobilizer - Right: On at all times Splint/Cast: RUE Restrictions Weight Bearing Restrictions Per Provider Order: No RUE Weight Bearing Per Provider Order: Partial weight bearing RUE Partial Weight Bearing Percentage or Pounds: okay to use walker, confirmed with MD/ortho, okay to weight bear right UE as long as R elbow remains extended RLE Weight Bearing Per Provider Order: Weight bearing as tolerated Other Position/Activity Restrictions: posterior hip precautions  Pain: no pain     Therapy/Group: Individual Therapy  Ronal Gift [facility] 12/21/2023, 12:00 PM

## 2023-12-21 NOTE — Progress Notes (Signed)
 Patient ID: Sherri  Sherri [name], female   DOB: 01/27/1941, 83 y.o.   MRN: [id number] [facility] Progress Note   Assessment/ Plan:   1.  Hyponatremia: She appears euvolemic on physical exam.  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  No evidence of hypothyroidism or adrenal insufficiency.  Sodium level 129 and she does not have any indications for tolvaptan or hypertonic saline.  Control of nausea much better; started on  ure-Na supplementation (4 doses). 2.  Hypocalcemia: Her albumin  corrected calcium  is within normal range.  Ionized calcium  pending. 3.  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  Rheumatoid arthritis: She is currently on methotrexate  and prednisone .  No evidence of adrenal insufficiency.  Subjective:   Without acute events overnight, no chest pain or shortness of breath. Nausea no longer present   Objective:   BP (!) 95/56 (BP Location: Left Arm)   Pulse 97   Temp 98 F (36.7 C) (Oral)   Resp 16   Ht 5' (1.524 m)   Wt 49.3 kg   SpO2 100%   BMI 21.23 kg/m   Intake/Output Summary (Last 24 hours) at 12/21/2023 1636 Last data filed at 12/21/2023 1610 Gross per 24 hour  Intake 340 ml  Output 1650 ml  Net -1310 ml   Weight change: -1.4 kg  Physical Exam: Gen: Supine in bed CVS: Pulse regular rhythm, normal rate, S1 and S2 normal Resp: Clear to auscultation bilaterally, no rales/rhonchi Abd: Soft, flat, nontender, bowel sounds normal Ext: Right thigh ulcer with use gauze dressing.  Right upper extremity in Ace wrap GU: foley  Imaging: No results found.   Labs: BMET Recent Labs  Lab 12/16/23 0428 12/17/23 0513 12/18/23 0521 12/19/23 0519 12/20/23 0612 12/21/23 0535  NA 130* 128* 131* 128* 128* 129*  K 3.4* 3.7 3.7 3.4* 3.9 3.4*  CL 95* 95* 98 98 98 96*  CO2 23 24 24  21* 22  24  GLUCOSE 75 79 81 74 96 80  BUN 5* <5* <5* <5* 6* 14  CREATININE 0.62 0.56 0.52 0.46 0.52 0.68  CALCIUM  7.6* 7.5* 7.4* 7.0* 7.8* 8.1*  PHOS  --   --  2.2* 2.3* 2.7 3.3   CBC Recent Labs  Lab 12/16/23 0428 12/17/23 0513 12/21/23 0535  WBC 10.4 7.8 4.1  NEUTROABS  --  7.2 2.6  HGB 9.2* 8.6* 9.1*  HCT 27.4* 25.8* 27.5*  MCV 95.5 95.6 94.5  PLT 178 179 162    Medications:     vitamin C   1,000 mg Oral Daily   atorvastatin   10 mg Oral QPM   bethanechol   5 mg Oral TID   carvedilol   3.125 mg Oral BID WC   cefadroxil   500 mg Oral BID   Chlorhexidine  Gluconate Cloth  6 each Topical Daily   cholecalciferol   1,000 Units Oral q AM   cyclobenzaprine   10 mg Oral QHS   DULoxetine   20 mg Oral QHS   feeding supplement  237 mL Oral BID BM   furosemide   10 mg Oral Daily   Gerhardt's butt cream   Topical TID   methotrexate   12.5 mg Oral Q Wed   mouth rinse  15 mL Mouth Rinse 4 times per day   pantoprazole   40 mg Oral BID AC   polyethylene glycol  17 g Oral BID   predniSONE   5 mg Oral Q breakfast  senna  1 tablet Oral BID   sodium chloride   1 g Oral TID WC   tamsulosin   0.4 mg Oral QPC supper   urea   15 g Oral BID   zinc  sulfate (50mg  elemental zinc )  220 mg Oral Q supper

## 2023-12-22 ENCOUNTER — Encounter: Payer: Self-pay | Admitting: Rheumatology

## 2023-12-22 ENCOUNTER — Other Ambulatory Visit (HOSPITAL_COMMUNITY): Payer: Self-pay

## 2023-12-22 LAB — RENAL FUNCTION PANEL
Albumin: 1.9 g/dL — ABNORMAL LOW (ref 3.5–5.0)
Anion gap: 10 (ref 5–15)
BUN: 11 mg/dL (ref 8–23)
CO2: 23 mmol/L (ref 22–32)
Calcium: 7.9 mg/dL — ABNORMAL LOW (ref 8.9–10.3)
Chloride: 101 mmol/L (ref 98–111)
Creatinine, Ser: 0.49 mg/dL (ref 0.44–1.00)
GFR, Estimated: >60 mL/min (ref >60)
Glucose, Bld: 69 mg/dL — ABNORMAL LOW (ref 70–99)
Phosphorus: 3.2 mg/dL (ref 2.5–4.6)
Potassium: 3.8 mmol/L (ref 3.5–5.1)
Sodium: 134 mmol/L — ABNORMAL LOW (ref 135–145)

## 2023-12-22 LAB — MAGNESIUM: Magnesium: 1.5 mg/dL — ABNORMAL LOW (ref 1.7–2.4)

## 2023-12-22 LAB — OCCULT BLOOD X 1 CARD TO LAB, STOOL: Fecal Occult Bld: NEGATIVE

## 2023-12-22 MED ORDER — SODIUM CHLORIDE 1 G PO TABS
1.0000 g | ORAL_TABLET | Freq: Two times a day (BID) | ORAL | Status: DC
  Administered 2023-12-22 – 2023-12-23 (×2): 1 g via ORAL
  Filled 2023-12-22 (×2): qty 1

## 2023-12-22 MED ORDER — SODIUM CHLORIDE 1 G PO TABS
1.0000 g | ORAL_TABLET | Freq: Three times a day (TID) | ORAL | 0 refills | Status: DC
  Filled 2023-12-22: qty 90, 30d supply, fill #0

## 2023-12-22 MED ORDER — ASCORBIC ACID 1000 MG PO TABS
1000.0000 mg | ORAL_TABLET | Freq: Every day | ORAL | 0 refills | Status: AC
  Filled 2023-12-22: qty 100, 100d supply, fill #0

## 2023-12-22 MED ORDER — CHLORHEXIDINE GLUCONATE CLOTH 2 % EX PADS
6.0000 | MEDICATED_PAD | Freq: Two times a day (BID) | CUTANEOUS | Status: DC
  Administered 2023-12-22 – 2023-12-23 (×3): 6 via TOPICAL

## 2023-12-22 MED ORDER — ATORVASTATIN CALCIUM 10 MG PO TABS
10.0000 mg | ORAL_TABLET | Freq: Every evening | ORAL | 0 refills | Status: DC
  Filled 2023-12-22: qty 30, 30d supply, fill #0

## 2023-12-22 MED ORDER — POLYETHYLENE GLYCOL 3350 17 GM/SCOOP PO POWD
17.0000 g | Freq: Two times a day (BID) | ORAL | 0 refills | Status: AC
Start: 2023-12-22 — End: ?
  Filled 2023-12-22: qty 238, 7d supply, fill #0

## 2023-12-22 MED ORDER — FUROSEMIDE 20 MG PO TABS
10.0000 mg | ORAL_TABLET | Freq: Every day | ORAL | 0 refills | Status: DC
  Filled 2023-12-22: qty 30, 60d supply, fill #0

## 2023-12-22 MED ORDER — MAGNESIUM OXIDE -MG SUPPLEMENT 400 (240 MG) MG PO TABS
400.0000 mg | ORAL_TABLET | Freq: Every day | ORAL | 0 refills | Status: DC
  Filled 2023-12-22: qty 30, 30d supply, fill #0

## 2023-12-22 MED ORDER — OXYCODONE HCL 5 MG PO TABS
5.0000 mg | ORAL_TABLET | ORAL | 0 refills | Status: DC | PRN
  Filled 2023-12-22: qty 24, 4d supply, fill #0

## 2023-12-22 MED ORDER — GERHARDT'S BUTT CREAM
1.0000 | TOPICAL_CREAM | Freq: Three times a day (TID) | CUTANEOUS | 0 refills | Status: DC
  Filled 2023-12-22: qty 60, 20d supply, fill #0

## 2023-12-22 MED ORDER — OXYCODONE HCL 5 MG PO TABS: 5.0000 mg | ORAL_TABLET | ORAL | 0 refills | Status: DC | PRN

## 2023-12-22 MED ORDER — MAGNESIUM SULFATE IN D5W 1-5 GM/100ML-% IV SOLN
1.0000 g | Freq: Once | INTRAVENOUS | Status: AC
  Administered 2023-12-22: 1 g via INTRAVENOUS
  Filled 2023-12-22: qty 100

## 2023-12-22 MED ORDER — TAMSULOSIN HCL 0.4 MG PO CAPS
0.4000 mg | ORAL_CAPSULE | Freq: Every day | ORAL | 0 refills | Status: DC
  Filled 2023-12-22: qty 30, 30d supply, fill #0

## 2023-12-22 MED ORDER — ZINC SULFATE 220 (50 ZN) MG PO TABS
220.0000 mg | ORAL_TABLET | Freq: Every day | ORAL | 0 refills | Status: AC
  Filled 2023-12-22: qty 100, 100d supply, fill #0

## 2023-12-22 MED ORDER — CEFADROXIL 500 MG PO CAPS
500.0000 mg | ORAL_CAPSULE | Freq: Two times a day (BID) | ORAL | 0 refills | Status: DC
  Filled 2023-12-22: qty 10, 5d supply, fill #0

## 2023-12-22 NOTE — Progress Notes (Signed)
 PROGRESS NOTE   Subjective/Complaints: Pt looking forward to going home tomorrow. She asks about her foley, discussed its for urinary retention. Pain overall was doing better this AM, but increased after therapy.   ROS: as per HPI. Denies CP, SOB, abd pain, N/V/D, HA  + bladder incontinence/retention- No has foley cath  + Fatigue + Right leg pain   Objective:   No results found.   Recent Labs    12/21/23 0535  WBC 4.1  HGB 9.1*  HCT 27.5*  PLT 162     Recent Labs    12/21/23 0535 12/22/23 0456  NA 129* 134*  K 3.4* 3.8  CL 96* 101  CO2 24 23  GLUCOSE 80 69*  BUN 14 11  CREATININE 0.68 0.49  CALCIUM  8.1* 7.9*    Intake/Output Summary (Last 24 hours) at 12/22/2023 0853 Last data filed at 12/22/2023 0750 Gross per 24 hour  Intake 620 ml  Output 1850 ml  Net -1230 ml        Physical Exam: Vital Signs Blood pressure (!) 134/57, pulse 88, temperature 98.5 F (36.9 C), temperature source Oral, resp. rate 18, height 5' (1.524 m), weight 47.6 kg, SpO2 92%.  General: Sitting in WC, NAD, laying in bed, finishing therapy with OT HEENT: Head is normocephalic, atraumatic, sclera anicteric, MMM Neck: Supple without JVD or lymphadenopathy Heart: Reg rate and rhythm. No murmurs rubs or gallops Chest: CTA bilaterally without wheezes, rales, or rhonchi; no distress Abdomen: Soft, non-tender, non-distended, bowel sounds positive. Extremities: No significant lower extremity edema, R leg with gauze wrap Psych: calm and cooperative Skin: Thin skin, multiple skin tears and covered areas, scattered bruising.   Neuro: Awake and alert, less confused, oriented  to person place, year, month.  sensation intact in all 4 extremities to LT, hard of hearing   MSK:  Arthritic changes noted in her hands RUE with splint and ace wrap R leg drain has been removed,  VAC has small amount drainage in it Lifts bilateral upper  extremities to gravity RLE- HF 3/5;  KE 4-/5, DF/PF 4/5 LLE- 5-/5 throughout   Image from 7/24  Assessment/Plan: 1. Functional deficits which require 3+ hours per day of interdisciplinary therapy in a comprehensive inpatient rehab setting. Physiatrist is providing close team supervision and 24 hour management of active medical problems listed below. Physiatrist and rehab team continue to assess barriers to discharge/monitor patient progress toward functional and medical goals  Care Tool:  Bathing    Body parts bathed by patient: Right arm, Left upper leg, Left arm, Chest, Abdomen, Front perineal area, Right upper leg, Face   Body parts bathed by helper: Right lower leg, Left lower leg, Buttocks, Front perineal area     Bathing assist Assist Level: Moderate Assistance - Patient 50 - 74%     Upper Body Dressing/Undressing Upper body dressing   What is the patient wearing?: Pull over shirt    Upper body assist Assist Level: Set up assist    Lower Body Dressing/Undressing Lower body dressing      What is the patient wearing?: Pants, Incontinence brief     Lower body assist Assist for lower body dressing: Maximal  Assistance - Patient 25 - 49%     Toileting Toileting Toileting Activity did not occur Press photographer and hygiene only): N/A (no void or bm)  Toileting assist Assist for toileting: Maximal Assistance - Patient 25 - 49%     Transfers Chair/bed transfer  Transfers assist     Chair/bed transfer assist level: Moderate Assistance - Patient 50 - 74%     Locomotion Ambulation   Ambulation assist   Ambulation activity did not occur: Safety/medical concerns  Assist level: Contact Guard/Touching assist Assistive device: Parallel bars Max distance: 10   Walk 10 feet activity   Assist  Walk 10 feet activity did not occur: Safety/medical concerns  Assist level: Contact Guard/Touching assist Assistive device: Parallel bars   Walk 50 feet  activity   Assist Walk 50 feet with 2 turns activity did not occur: Safety/medical concerns         Walk 150 feet activity   Assist Walk 150 feet activity did not occur: Safety/medical concerns         Walk 10 feet on uneven surface  activity   Assist Walk 10 feet on uneven surfaces activity did not occur: Safety/medical concerns         Wheelchair     Assist Is the patient using a wheelchair?: Yes Type of Wheelchair: Manual    Wheelchair assist level: Dependent - Patient 0%      Wheelchair 50 feet with 2 turns activity    Assist        Assist Level: Dependent - Patient 0%   Wheelchair 150 feet activity     Assist      Assist Level: Dependent - Patient 0%   Blood pressure (!) 134/57, pulse 88, temperature 98.5 F (36.9 C), temperature source Oral, resp. rate 18, height 5' (1.524 m), weight 47.6 kg, SpO2 92%.  Medical Problem List and Plan: 1. 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 She also had R elbow hardware removal and olecranon excision and triceps advancement 11/28/23.             -patient may not  shower due to VAC             -ELOS/Goals: 12-14 days- Superviison to min A WBAT with walker RLE-posterior hip precautions Partial weightbearing RUE okay to use walker per Dr. Edna- s/p R elbow hardware removal - Message ortho to confirm wt bearing use with walker- OK for weightbearing but no bending elbow-discussed with Dr. Edna, appreciate assistance.  Okay to remove splint when thermoplastic splint is ready.  Okay to remove this for wound care and hygiene otherwise needs to stay on.  -Continue CIR - Patient was seen by Kemp Hila Cockerham-possible suture removal Monday, plan for right upper extremity splint to be made today 12/11/2023 -Addendum: social work note indicates step-grandson committed suicide  - Patient seen by orthopedics  7/21 -15/7 schedule -Expected  7/30   - Patient was seen by surgery-JP removed, appears plan for continued wound VAC with change to Prevena on discharge. follow-up in [facility] office 7 days after discharge for possible removal -DC home tomorrow.   2.  Antithrombotics: -DVT/anticoagulation:  Mechanical:  Antiembolism stockings, knee (TED hose) Bilateral lower extremities Sequential compression devices, below knee Bilateral lower extremities             -antiplatelet therapy: Aspirin  81 mg held due to bleeding risk  3. Pain Management: Oxycodone  and Tylenol  as needed             -  Continue Cymbalta  20 mg daily and Flexeril  10 mg at bedtime 4. Mood/Behavior/Sleep: ego support             -antipsychotic agents: N/A 5. Neuropsych/cognition: This patient is intermittently capable of making decisions on her own behalf.  6. Skin/Wound Care: Routine pressure relief measures             -JP drain-per Ortho will discontinue when output < 10 cc per shift             -Continue negative pressure at 75 mmHg--can be exchanged to Prevena  - MASD-7/15 start Diflucan  200 mg then 100 mg x 6 days -Gerhardt's  -Diffuse ecchymoses- Platelets 330 CTM Routine labs, follow-up with derm/hematology outpatient. -ID pharm recommending 4 weeks abx from time of OR for elbow 8/2, finish cipro  for UTI then switch back to cefadroxil , CRP is elevated  7. Fluids/Electrolytes/Nutrition: Monitor I's and O's, routine labs             -Diet Heart Healthy-- SLP recc nectar thick- pt not agreeable  -Ensure High protein + Zinc  and vitamin C    8. Hypertension/ intermittent mild tachycardia: Continue Coreg  3.125 mg BID,              -7/18-26 BP controlled, continue to monitor -12/20/23 BPs a little soft this morning-- advised using Abd binder and LLE TED while sitting/standing, because BP laying down is 150s/70s this morning-- if still soft then can hold Coreg  this morning, but if better then still give Coreg . Nursing aware.  -7/29 BP controlled   Vitals:    12/20/23 0518 12/20/23 0800 12/20/23 1036 12/20/23 1126  BP: (!) 151/77 (!) 84/55 94/79 105/61   12/20/23 1345 12/20/23 1731 12/20/23 2002 12/21/23 0443  BP: 123/67 114/67 122/70 (!) 150/69   12/21/23 1559 12/21/23 1746 12/21/23 2004 12/22/23 0527  BP: (!) 95/56 127/66 105/64 (!) 134/57      9. Rheumatoid arthritis-continue home prednisone  5 mg daily and methotrexate  12.5mg  weekly- methotrexate  just restarted.   10. Microcytic anemia/acute blood loss anemia: monitor for signs of bleeding recheck CBC in a.m   -normal folate & B12, iron study suggestive of AOC, status post 3 units PRBC. 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 Constipation- senna 8.6mg  BID  - 7/16 reports improvement after multiple bowel movements yesterday  -7/17 LMB 7/15, continue to monitor  -7/18 miralrax daily, PRN sorbitol  -7/24 LBM documented 7/22, will increase to BID, consider additional medication tomorrow if no BM -12/19/23 pt stating LBM yesterday but not documented. Having nursing give PRN sorbitol  today to see if she has a BM today.  -12/20/23 LBM this morning or last night, after sorbitol ; monitor.  -7/29 LBM today  13. Dysphagia, chronic  -Prior SLP eval re nectar thick, she has been refusing modification/nectar thick liquids -SLP has been consulted- she continues to decline interventions  14. Hypokalemia/Hyponatremia  -7/17 potassium x2 today, recheck tomorrow 7/18 K+ up to 4.5, NA lower at 128  -Check urine sodium/osmolality due to hyponatremia, asymptomatic,  Cr higher then her baseline at 0.8 today, HGB higher on 7/15 and 7/17 think she is a little fluid depleted, add NS 50ml/hr and monitor response  -Cr 0.57, Na 131, K 3.7. will stop fluids today and recheck Monday. Urine studies not done yet.  -7/21 Sodium up to 132, will  add 1 g sodium chloride  twice daily -7/23  Hypokalemia lower at 3.4, give 30meq potassium, NA 130, will increase salt tabs to three times a day, urine labs still not completed- message nursing -7/24 K+ stable, NA down to 128, check cortisol, urine labs pending, Serum osmolality  -7/25 consult nephrology -appreciate assistance, suspecting combination SIADH and poor solute intake.  On salt tabs and furosemide  and protein supplement was started by nephrology.  Sodium a little higher at 131 today -12/19/23 Na 128, appreciate nephro support, recommending fluid restriction. Monitor. K 3.4, stable, receiving Klor 20mEq BID -12/20/23 Na 128 again today, renal starting urea  x4 doses (BID x2d), still on lasix  and salt tabs; monitor -7/28 order 30 mEq potassium repletion, sodium a little better at 129 today 7/29 K+ up to 3.8, NA up to 134, appreciate nephrology assistance. Discussed with nephrology- should be ok to DC home from renal perspective  15. Nausea  -compazine  PRN -7/19 improved today, discussed with pharmacy to try to adjust medication timing.  Nursing order to try to do medications after breakfast   16. HLD: continue atorvastatin  10mg  nightly.   17. Overactive Bladder- cont Oxybutynin  5mg  nightly -12/13/23 pt now having urinary retention requiring caths. Will stop Oxybutinin, monitor off it for now, if still having retention then would get U/A and consider adding urecholine   -7/21 will add Flomax , check UA  -7/22 volumes appear little better, start Macrobid   -7/23 will ask nursing continue to check bladder scans/PVR  -7/25 will try low dose urocholine 5mg  TID for retention  -7/26-27/25 still needing cathing, monitor for now since just started meds -7/28 family and patient unable to do IC, will start Foley follow-up with urology -7/29 Continue foley, f/u urology  18. Suspected UTI -7/22 Increased retention, potentially a little more confused- suspect UTI Discussed with ID pharm, start macrobid - appreciate assistance. Follow  cultures -7/23 ABX changed to cipro  250mg  BID was growing pseudomonas, cultures indicate it is sensitive-- followed by duricef to complete that course  19. Hypomagnesemia  -discussed with pharmacy 1g IV    LOS: 14 days A FACE TO FACE EVALUATION WAS PERFORMED  Murray Collier 12/22/2023, 8:53 AM

## 2023-12-22 NOTE — Progress Notes (Signed)
 Physical Therapy Discharge Summary  Patient Details  Name: Sherri [name] MRN: [id number] Date of Birth: 1940-08-07  Date of Discharge from PT service:December 22, 2023   Patient has met 7 of 7 long term goals due to improved activity tolerance, improved balance, increased strength, increased range of motion, decreased pain, ability to compensate for deficits, improved attention, improved awareness, and improved coordination.  Patient to discharge at a wheelchair level Min Assist.   Patient's care partner is independent to provide the necessary physical assistance at discharge.   Recommendation:  Patient will benefit from ongoing skilled PT services in home health setting to continue to advance safe functional mobility, address ongoing impairments in strength/ROM/balance/gait/endurance, and minimize fall risk.  Equipment: Non emergency medical transport scheduled for transportation home. No equipment provided. Pt has WC, lift bed, RW, BSC, shower chair, lift chair.   Reasons for discharge: treatment goals met and discharge from hospital  Patient/family agrees with progress made and goals achieved: Yes  PT Discharge Precautions/Restrictions Precautions Precautions: Fall;Posterior Hip;Other (comment) Recall of Precautions/Restrictions: Impaired Precaution/Restrictions Comments: R LE wound vac, catheter, MASD Required Braces or Orthoses: Splint/Cast Splint/Cast: RUE Restrictions Weight Bearing Restrictions Per Provider Order: No RUE Weight Bearing Per Provider Order: Partial weight bearing RUE Partial Weight Bearing Percentage or Pounds: okay to use walker, confirmed with MD/ortho, okay to weight bear right UE as long as R elbow remains extended RLE Weight Bearing Per Provider Order: Weight bearing as tolerated Other Position/Activity Restrictions: posterior hip precautions Pain Interference Pain Interference Pain Effect on Sleep: 4. Almost constantly Pain Interference with Therapy  Activities: 4. Almost constantly Pain Interference with Day-to-Day Activities: 4. Almost constantly Vision/Perception  Vision - History Ability to See in Adequate Light: 0 Adequate Perception Perception: Within Functional Limits Praxis Praxis: WFL  Cognition Overall Cognitive Status: History of cognitive impairments - at baseline Arousal/Alertness: Awake/alert Orientation Level: Oriented to person;Oriented to place Memory: Impaired (baseline dementia) Awareness: Impaired Problem Solving: Impaired Safety/Judgment: Impaired Sensation Sensation Light Touch: Impaired by gross assessment Additional Comments: significant MASD B buttocks with rash, light touch intact in all dermatomes B LE Coordination Gross Motor Movements are Fluid and Coordinated: No Fine Motor Movements are Fluid and Coordinated: No Coordination and Movement Description: limited 2/2 posterior hip precautions, generalized debility Motor  Motor Motor: Other (comment) Motor - Skilled Clinical Observations: generalized debility,  R LE posterior hip precautions, R LE WBAT,  Mobility Bed Mobility Bed Mobility: Supine to Sit;Sit to Supine Supine to Sit: Minimal Assistance - Patient > 75% (RLE management) Sit to Supine: Minimal Assistance - Patient > 75% Transfers Transfers: Sit to Stand;Stand to Sit;Stand Pivot Transfers Sit to Stand: Contact Guard/Touching assist Stand to Sit: Contact Guard/Touching assist Stand Pivot Transfers: Minimal Assistance - Patient > 75% Stand Pivot Transfer Details: Verbal cues for precautions/safety;Verbal cues for safe use of DME/AE;Verbal cues for technique;Verbal cues for sequencing Transfer (Assistive device): Rolling walker Locomotion  Gait Ambulation: Yes Gait Assistance: Contact Guard/Touching assist (WC in tow) Gait Distance (Feet): 10 Feet Assistive device: Rolling walker Gait Gait: Yes Gait Pattern: Impaired Gait Pattern: Step-to pattern;Antalgic Gait velocity:  decr Stairs / Additional Locomotion Stairs: Yes Stairs Assistance: Minimal Assistance - Patient > 75% Stair Management Technique: Two rails Number of Stairs: 1 Height of Stairs: 3 Wheelchair Mobility Wheelchair Mobility: Yes Wheelchair Assistance: Dependent - Patient 0% Wheelchair Parts Management: Needs assistance  Trunk/Postural Assessment  Cervical Assessment Cervical Assessment: Exceptions to [facility] (forward head) Thoracic Assessment Thoracic Assessment: Exceptions to [facility] (kyphosis) Lumbar Assessment Lumbar  Assessment: Exceptions to [facility] (posterior pelvic tilt) Postural Control Postural Control: Deficits on evaluation Righting Reactions: delayed Protective Responses: delayed Postural Limitations: delayed  Balance Balance Balance Assessed: Yes Static Sitting Balance Static Sitting - Balance Support: Feet supported Static Sitting - Level of Assistance: 6: Modified independent (Device/Increase time) Dynamic Sitting Balance Dynamic Sitting - Balance Support: Feet supported;During functional activity Dynamic Sitting - Level of Assistance: 5: Stand by assistance Static Standing Balance Static Standing - Balance Support: Bilateral upper extremity supported;During functional activity Static Standing - Level of Assistance: 5: Stand by assistance (CGA) Dynamic Standing Balance Dynamic Standing - Balance Support: Bilateral upper extremity supported;During functional activity Dynamic Standing - Level of Assistance: 4: Min assist Extremity Assessment  RLE Assessment RLE Assessment: Exceptions to [facility] RLE Strength RLE Overall Strength: Deficits Right Hip Flexion: 2/5 Right Hip Extension: 2/5 Right Hip ABduction: 2-/5 Right Hip ADduction: 2+/5 Right Knee Flexion: 3-/5 Right Knee Extension: 3+/5 Right Ankle Dorsiflexion: 4/5 LLE Assessment LLE Assessment: Exceptions to [facility] General Strength Comments: Grossly [address] [place], [place], DPT  12/22/2023, 8:05 AM

## 2023-12-22 NOTE — Progress Notes (Signed)
 Physical Therapy Session Note  Patient Details  Name: Sherri [name] MRN: [id number] Date of Birth: 06/16/1940  Today's Date: 12/22/2023 PT Individual Time: 0800-0856 PT Individual Time Calculation (min): 56 min   Short Term Goals: Week 2:  PT Short Term Goal 1 (Week 2): STG=LTG 2/2 ELOS  Skilled Therapeutic Interventions/Progress Updates:      Patient in bed to start - awake and agreeable to therapy treatment. Pt reports 3/10 R hip pain to start - provided repositoining and mobility for pain management.   Supine<>sitting EOB with modA using hospital bed features. Donned hearing aid for her R side with assist, unable to locate her L hearing aid. RN made aware.   Donned socks and shoes with totalA for time management. Completed modA squat pivot transfer towards her wheelchair with assist for managing catheter and wound vac lines. Donned abdominal binder as she sat up.   Transported patient to main gym at wheelchair level. Reviewed THA posterior hip precautions - pt able to recall 2/3 precautions. Provided education handout on THA and precautions. Also provided her with an HEP for home. See below for details.   Pt positioned into supine position on mat table with maxA for trunk and BLE support. Pt uncomofrtable while lying flat on firm surface. Worked on stretching and positioning for her RLE due to tendency to keep it externally rotated and lacks ~15 degrees of knee extension. Pt completed HEP as written below, 1x10 for each exercise.   Access Code: [email or web address] URL: [email or web address] Date: 12/22/2023 Prepared by: Sherlean Perks  Exercises - Supine Ankle Pumps  - 1 x daily - 7 x weekly - 3 sets - 10 reps - Supine Gluteal Sets  - 1 x daily - 7 x weekly - 3 sets - 10 reps - Supine Quadricep Sets  - 1 x daily - 7 x weekly - 3 sets - 10 reps - Supine Isometric Hip Adduction with Pillow at Knees  - 1 x daily - 7 x weekly - 3 sets - 10 reps - Supine Heel Slides  - 1 x  daily - 7 x weekly - 3 sets - 10 reps - Supine Short Arc Quad  - 1 x daily - 7 x weekly - 3 sets - 10 reps - Supine Hip Abduction  - 1 x daily - 7 x weekly - 3 sets - 10 reps - Seated Long Arc Quad  - 1 x daily - 7 x weekly - 3 sets - 10 reps  Patient Education - THA Posterior Precautions Handout - Posterior Hip Precautions -Car transfer with hip precautions  Pt returned to her room and was left sitting up in wheelchair, safety seat belt alarm on, call bell in lap.   Therapy Documentation Precautions:  Precautions Precautions: Fall, Posterior Hip, Other (comment) (PWB RUE- only for walking) Precaution Booklet Issued: No Recall of Precautions/Restrictions: Impaired Precaution/Restrictions Comments: R JP drain, RLE wound vac, MASD Required Braces or Orthoses: Splint/Cast Knee Immobilizer - Right: On at all times Splint/Cast: RUE Restrictions Weight Bearing Restrictions Per Provider Order: No RUE Weight Bearing Per Provider Order: Partial weight bearing RUE Partial Weight Bearing Percentage or Pounds: okay to use walker, confirmed with MD/ortho, okay to weight bear right UE as long as R elbow remains extended RLE Weight Bearing Per Provider Order: Weight bearing as tolerated Other Position/Activity Restrictions: posterior hip precautions General:       Therapy/Group: Individual Therapy  Sherlean SHAUNNA Perks 12/22/2023, 7:46 AM

## 2023-12-22 NOTE — Plan of Care (Signed)
  Problem: RH Balance Goal: LTG Patient will maintain dynamic sitting balance (PT) Description: LTG:  Patient will maintain dynamic sitting balance with assistance during mobility activities (PT) Outcome: Completed/Met Goal: LTG Patient will maintain dynamic standing balance (PT) Description: LTG:  Patient will maintain dynamic standing balance with assistance during mobility activities (PT) Outcome: Completed/Met   Problem: Sit to Stand Goal: LTG:  Patient will perform sit to stand with assistance level (PT) Description: LTG:  Patient will perform sit to stand with assistance level (PT) Outcome: Completed/Met   Problem: RH Bed Mobility Goal: LTG Patient will perform bed mobility with assist (PT) Description: LTG: Patient will perform bed mobility with assistance, with/without cues (PT). Outcome: Completed/Met   Problem: RH Bed to Chair Transfers Goal: LTG Patient will perform bed/chair transfers w/assist (PT) Description: LTG: Patient will perform bed to chair transfers with assistance (PT). Outcome: Completed/Met   Problem: RH Car Transfers Goal: LTG Patient will perform car transfers with assist (PT) Description: LTG: Patient will perform car transfers with assistance (PT). Outcome: Completed/Met   Problem: RH Ambulation Goal: LTG Patient will ambulate in controlled environment (PT) Description: LTG: Patient will ambulate in a controlled environment, # of feet with assistance (PT). Outcome: Completed/Met

## 2023-12-22 NOTE — Progress Notes (Signed)
 Occupational Therapy Session Note  Patient Details  Name: Sherri [name] MRN: [id number] Date of Birth: [birth year]  Session 1: Today's Date: 12/22/2023 OT Individual Time: 9084-8997 OT Individual Time Calculation (min): 47 min   Session 2: Today's Date: 12/22/2023 OT Individual Time: 8597-8488 OT Individual Time Calculation (min): 69 min   Short Term Goals: Week 2:  OT Short Term Goal 1 (Week 2): STG=LTG d.t ELOs  Session 1: Skilled Therapeutic Interventions/Progress Updates:    Patient agreeable to participate in OT session. Reports 3-4/10 pain level premedicated.   Patient participated in skilled OT session focusing on functional mobility, discharge planning, and education on ADL participation due to addition of catheter line and retaining large wound vac. Patient able to complete transfers with min A with verbal cues for balance. Patient demonstrated increased ability to complete. Patient educated on threading catheter line through pants for LB dressing. Patient observed completing dressing activities. Patient able to complete functional mobility with RW for 10 ft with min A. Patient returned to bed all needs in reach, alarm on.   Session 2: Skilled Therapeutic Interventions/Progress Updates:    Patient agreeable to participate in OT session. Reports 5/10 pain level premedicated .   Patient participated in skilled OT session focusing on UE self care, splint education, and LB dressing with AE. Patient able to complete LB dressing with AE with mod A and verbal cues with reacher. Several trials with increased time. [name] Splint adjusted due to reported pain. Patient returned to chair all needs in reach.   Therapy Documentation Precautions:  Precautions Precautions: Fall, Posterior Hip, Other (comment) (PWB RUE- only for walking) Precaution Booklet Issued: No Recall of Precautions/Restrictions: Impaired Precaution/Restrictions Comments: R JP drain, RLE wound vac, MASD Required Braces  or Orthoses: Splint/Cast Knee Immobilizer - Right: On at all times Splint/Cast: RUE Restrictions Weight Bearing Restrictions Per Provider Order: No RUE Weight Bearing Per Provider Order: Partial weight bearing RUE Partial Weight Bearing Percentage or Pounds: okay to use walker, confirmed with MD/ortho, okay to weight bear right UE as long as R elbow remains extended RLE Weight Bearing Per Provider Order: Weight bearing as tolerated Other Position/Activity Restrictions: posterior hip precautions  Therapy/Group: Individual Therapy  D'mariea L Motty Borin 12/22/2023, 7:54 AM

## 2023-12-22 NOTE — Progress Notes (Addendum)
 Patient ID: Sherri  [name], female   DOB: August 11, 1940, 83 y.o.   MRN: [id number] [facility] Progress Note   Assessment/ Plan:   1.  Hyponatremia: She appears euvolemic on physical exam.  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  No evidence of hypothyroidism or adrenal insufficiency.  Sodium level 129 and she does not have any indications for tolvaptan or hypertonic saline.  Control of nausea much better; started on  ure-Na supplementation (4 doses).  Feeling a lot better than I wonder if that is why her sodium is rising even more so than the medications we are giving her.  No further ure-Na for now and will cut the NaCl to twice daily; will reorder for follow-up in 2 weeks with therapeutic changes to follow based on chemistry panel.  Signing off at this time; please reconsult as needed.  2.  Hypocalcemia: Her albumin  corrected calcium  is within normal range.  Ionized calcium  pending. 3.  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  Rheumatoid arthritis: She is currently on methotrexate  and prednisone .  No evidence of adrenal insufficiency.  Subjective:   Without acute events overnight, no chest pain or shortness of breath. Nausea no longer present she is feeling much better.  She states she is eating like a horse.   Objective:   BP (!) 134/57 (BP Location: Left Arm)   Pulse 88   Temp 98.5 F (36.9 C) (Oral)   Resp 18   Ht 5' (1.524 m)   Wt 47.6 kg   SpO2 92%   BMI 20.49 kg/m   Intake/Output Summary (Last 24 hours) at 12/22/2023 1423 Last data filed at 12/22/2023 0750 Gross per 24 hour  Intake 380 ml  Output 1850 ml  Net -1470 ml   Weight change: -1.7 kg  Physical Exam: Gen: Supine in bed CVS: Pulse regular rhythm, normal rate, S1 and S2 normal Resp: Clear to auscultation bilaterally, no  rales/rhonchi Abd: Soft, flat, nontender, bowel sounds normal Ext: Right thigh ulcer with use gauze dressing.  Right upper extremity in Ace wrap GU: foley  Imaging: No results found.   Labs: BMET Recent Labs  Lab 12/16/23 0428 12/17/23 0513 12/18/23 0521 12/19/23 0519 12/20/23 0612 12/21/23 0535 12/22/23 0456  NA 130* 128* 131* 128* 128* 129* 134*  K 3.4* 3.7 3.7 3.4* 3.9 3.4* 3.8  CL 95* 95* 98 98 98 96* 101  CO2 23 24 24  21* 22 24 23   GLUCOSE 75 79 81 74 96 80 69*  BUN 5* <5* <5* <5* 6* 14 11  CREATININE 0.62 0.56 0.52 0.46 0.52 0.68 0.49  CALCIUM  7.6* 7.5* 7.4* 7.0* 7.8* 8.1* 7.9*  PHOS  --   --  2.2* 2.3* 2.7 3.3 3.2   CBC Recent Labs  Lab 12/16/23 0428 12/17/23 0513 12/21/23 0535  WBC 10.4 7.8 4.1  NEUTROABS  --  7.2 2.6  HGB 9.2* 8.6* 9.1*  HCT 27.4* 25.8* 27.5*  MCV 95.5 95.6 94.5  PLT 178 179 162    Medications:     vitamin C   1,000 mg Oral Daily   atorvastatin   10 mg Oral QPM   carvedilol   3.125 mg Oral BID WC   cefadroxil   500 mg Oral BID   Chlorhexidine  Gluconate Cloth  6 each Topical BID   cholecalciferol   1,000 Units Oral q AM   cyclobenzaprine   10 mg Oral QHS  DULoxetine   20 mg Oral QHS   feeding supplement  237 mL Oral BID BM   furosemide   10 mg Oral Daily   Gerhardt's butt cream   Topical TID   methotrexate   12.5 mg Oral Q Wed   mouth rinse  15 mL Mouth Rinse 4 times per day   pantoprazole   40 mg Oral BID AC   polyethylene glycol  17 g Oral BID   predniSONE   5 mg Oral Q breakfast   senna  1 tablet Oral BID   sodium chloride   1 g Oral TID WC   tamsulosin   0.4 mg Oral QPC supper   zinc  sulfate (50mg  elemental zinc )  220 mg Oral Q supper

## 2023-12-22 NOTE — Progress Notes (Signed)
 Occupational Therapy Discharge Summary  Patient Details  Name: Sherri [name] MRN: [id number] Date of Birth: 26-Jan-1941  Date of Discharge from OT service:{Time; dates multiple:304500300}  {CHL IP REHAB OT TIME CALCULATIONS:304400400}   Patient has met {NUMBERS 0-12:18577} of {NUMBERS 0-12:18577} long term goals due to {due un:6958348}.  Patient to discharge at overall {LOA:3049010} level.  Patient's care partner {care partner:3041650} to provide the necessary {assistance:3041652} assistance at discharge.    Reasons goals not met: ***  Recommendation:  Patient will benefit from ongoing skilled OT services in {setting:3041680} to continue to advance functional skills in the area of {ADL/iADL:3041649}.  Equipment: {equipment:3041657}  Reasons for discharge: {Reason for discharge:3049018}  Patient/family agrees with progress made and goals achieved: {Pt/Family agree with progress/goals:3049020}  OT Discharge Precautions/Restrictions  Precautions Precautions: Fall;Posterior Hip;Other (comment) Precaution Booklet Issued: Yes (comment) Recall of Precautions/Restrictions: Impaired Precaution/Restrictions Comments: R LE wound vac, catheter, MASD Required Braces or Orthoses: Splint/Cast Splint/Cast: RUE Restrictions Weight Bearing Restrictions Per Provider Order: No RUE Weight Bearing Per Provider Order: Partial weight bearing RUE Partial Weight Bearing Percentage or Pounds: okay to use walker, confirmed with MD/ortho, okay to weight bear right UE as long as R elbow remains extended RLE Weight Bearing Per Provider Order: Weight bearing as tolerated Other Position/Activity Restrictions: posterior hip precautions General   Vital Signs   Pain Pain Assessment Pain Scale: 0-10 Pain Score: 4  Pain Location: Leg Pain Orientation: Upper;Anterior;Left;Right ADL ADL Equipment Provided: Reacher, Long-handled sponge Eating: Set up Where Assessed-Eating: Wheelchair Grooming:  Modified independent Where Assessed-Grooming: Wheelchair Upper Body Bathing: Supervision/safety Where Assessed-Upper Body Bathing: Wheelchair Lower Body Bathing: Minimal assistance Where Assessed-Lower Body Bathing: Sitting at sink, Wheelchair Upper Body Dressing: Setup Where Assessed-Upper Body Dressing: Wheelchair Lower Body Dressing: Moderate assistance Where Assessed-Lower Body Dressing: Wheelchair Toileting: Moderate cueing Where Assessed-Toileting: Teacher, adult education: Minimal assistance, Furniture conservator/restorer Method: Surveyor, minerals: Raised toilet seat Film/video editor: Minimal assistance Film/video editor Method: Information systems manager with back Vision Baseline Vision/History: 4 Cataracts Patient Visual Report: No change from baseline Vision Assessment?: No apparent visual deficits Perception  Perception: Within Functional Limits Praxis Praxis: WFL Cognition Cognition Overall Cognitive Status: History of cognitive impairments - at baseline Arousal/Alertness: Awake/alert Memory: Impaired Awareness: Impaired Problem Solving: Impaired Safety/Judgment: Impaired Brief Interview for Mental Status (BIMS) Repetition of Three Words (First Attempt): 3 Temporal Orientation: Year: Correct Temporal Orientation: Month: Accurate within 5 days Temporal Orientation: Day: Correct Recall: Sock: Yes, no cue required Recall: Blue: Yes, no cue required Recall: Bed: Yes, after cueing (a piece of furniture) BIMS Summary Score: 14 Sensation Sensation Light Touch: Impaired by gross assessment Additional Comments: significant MASD B buttocks with rash, light touch intact in all dermatomes B LE Coordination Gross Motor Movements are Fluid and Coordinated: No Fine Motor Movements are Fluid and Coordinated: No Coordination and Movement Description: limited 2/2 posterior hip precautions, generalized  debility Motor  Motor Motor: Other (comment) Motor - Skilled Clinical Observations: generalized debility,  R LE posterior hip precautions, R LE WBAT, Mobility  Bed Mobility Bed Mobility: Supine to Sit;Sit to Supine Supine to Sit: Minimal Assistance - Patient > 75% Sit to Supine: Minimal Assistance - Patient > 75% Transfers Sit to Stand: Contact Guard/Touching assist Stand to Sit: Contact Guard/Touching assist  Trunk/Postural Assessment  Cervical Assessment Cervical Assessment: Exceptions to [facility] Thoracic Assessment Thoracic Assessment: Exceptions to [facility] Lumbar Assessment Lumbar Assessment: Exceptions to [facility] Postural Control Postural Control: Deficits on evaluation Righting  Reactions: delayed Protective Responses: delayed Postural Limitations: delayed  Balance Balance Balance Assessed: Yes Static Sitting Balance Static Sitting - Balance Support: Feet supported Static Sitting - Level of Assistance: 6: Modified independent (Device/Increase time) Dynamic Sitting Balance Dynamic Sitting - Balance Support: Feet supported;During functional activity Dynamic Sitting - Level of Assistance: 5: Stand by assistance Static Standing Balance Static Standing - Balance Support: Bilateral upper extremity supported;During functional activity Static Standing - Level of Assistance: 5: Stand by assistance Dynamic Standing Balance Dynamic Standing - Balance Support: Bilateral upper extremity supported;During functional activity Dynamic Standing - Level of Assistance: 4: Min assist Extremity/Trunk Assessment RUE Assessment RUE Assessment: Exceptions to [facility] Active Range of Motion (AROM) Comments: shoulder ROM wfl, unable to test elbow General Strength Comments: shoulder 4-/5, elbow NT due to cast finger strength 4/5 LUE Assessment LUE Assessment: Within Functional Limits Active Range of Motion (AROM) Comments: WFL General Strength Comments: 4-/5   D'mariea L Raford Brissett 12/22/2023, 10:32 AM

## 2023-12-22 NOTE — Progress Notes (Signed)
 Patient ID: Sherri [name], female   DOB: [birth year], 83 y.o.   MRN: [id number] have set up [facility] for 11:00 tomorrow for discharge home. Have let husband and daughter know this

## 2023-12-22 NOTE — Progress Notes (Signed)
 Inpatient Rehabilitation Care Coordinator Discharge Note   Patient Details  Name: Sherri [name] MRN: [id number] Date of Birth: January 29, 1941   Discharge location: HOME WITH HUSBAND AND 24/7 HIRED CAREGIVER  Length of Stay: 15 days  Discharge activity level: MIN-MOD LEVEL  Home/community participation: HOMEBOUND  Patient response un:Yzjouy Literacy - How often do you need to have someone help you when you read instructions, pamphlets, or other written material from your doctor or pharmacy?: Sometimes  Patient response un:Dnrpjo Isolation - How often do you feel lonely or isolated from those around you?: Never  Services provided included: MD, RD, PT, OT, SLP, RN, CM, TR, Pharmacy, SW  Financial Services:  Field seismologist Utilized: Private Insurance [facility] MEDICARE  Choices offered to/list presented to: PT AND HUSBAND  Follow-up services arranged:  Home Health, Patient/Family request agency HH/DME Home Health Agency: [facility]  PT  OT  RN  AIDE      HH/DME Requested Agency: [facility]  Patient response to transportation need: Is the patient able to respond to transportation needs?: Yes In the past 12 months, has lack of transportation kept you from medical appointments or from getting medications?: No In the past 12 months, has lack of transportation kept you from meetings, work, or from getting things needed for daily living?: No   Patient/Family verbalized understanding of follow-up arrangements:  Yes  Individual responsible for coordination of the follow-up plan: [facility] [phone]  Confirmed correct DME delivered: Sherri [name] MATSU 12/22/2023    Comments (or additional information):FAMILY WAS IN FOR EDUCATION AND AWARE OF MOM'S CARE NEEDS. HAS HIRED ASSISTANCE WHICH WILL CONTINUE AT HOME  Summary of Stay    Date/Time Discharge Planning CSW  12/15/23 [phone] Home with husband (unable to provide physical care) and hired caregivers 24/7. Active with [facility]  services. DBS  12/09/23 9047 New evaluation today-according to acute chart going home with 24/7 caregivers has been to SNF had a bad experience. Active with HH services RGD       Sherri [name], [name] MATSU

## 2023-12-23 ENCOUNTER — Ambulatory Visit: Admitting: Rheumatology

## 2023-12-23 ENCOUNTER — Other Ambulatory Visit (HOSPITAL_COMMUNITY): Payer: Self-pay

## 2023-12-23 ENCOUNTER — Encounter: Payer: Self-pay | Admitting: Rheumatology

## 2023-12-23 DIAGNOSIS — G35 Multiple sclerosis: Secondary | ICD-10-CM

## 2023-12-23 DIAGNOSIS — M51369 Other intervertebral disc degeneration, lumbar region without mention of lumbar back pain or lower extremity pain: Secondary | ICD-10-CM

## 2023-12-23 DIAGNOSIS — S62101D Fracture of unspecified carpal bone, right wrist, subsequent encounter for fracture with routine healing: Secondary | ICD-10-CM

## 2023-12-23 DIAGNOSIS — R5383 Other fatigue: Secondary | ICD-10-CM

## 2023-12-23 DIAGNOSIS — Z8639 Personal history of other endocrine, nutritional and metabolic disease: Secondary | ICD-10-CM

## 2023-12-23 DIAGNOSIS — Z7952 Long term (current) use of systemic steroids: Secondary | ICD-10-CM

## 2023-12-23 DIAGNOSIS — M4312 Spondylolisthesis, cervical region: Secondary | ICD-10-CM

## 2023-12-23 DIAGNOSIS — G2581 Restless legs syndrome: Secondary | ICD-10-CM

## 2023-12-23 DIAGNOSIS — Z79899 Other long term (current) drug therapy: Secondary | ICD-10-CM

## 2023-12-23 DIAGNOSIS — M67431 Ganglion, right wrist: Secondary | ICD-10-CM

## 2023-12-23 DIAGNOSIS — S62102D Fracture of unspecified carpal bone, left wrist, subsequent encounter for fracture with routine healing: Secondary | ICD-10-CM

## 2023-12-23 DIAGNOSIS — F419 Anxiety disorder, unspecified: Secondary | ICD-10-CM

## 2023-12-23 DIAGNOSIS — E559 Vitamin D deficiency, unspecified: Secondary | ICD-10-CM

## 2023-12-23 DIAGNOSIS — Z872 Personal history of diseases of the skin and subcutaneous tissue: Secondary | ICD-10-CM

## 2023-12-23 DIAGNOSIS — M0579 Rheumatoid arthritis with rheumatoid factor of multiple sites without organ or systems involvement: Secondary | ICD-10-CM

## 2023-12-23 DIAGNOSIS — M79641 Pain in right hand: Secondary | ICD-10-CM

## 2023-12-23 DIAGNOSIS — Z96642 Presence of left artificial hip joint: Secondary | ICD-10-CM

## 2023-12-23 DIAGNOSIS — K219 Gastro-esophageal reflux disease without esophagitis: Secondary | ICD-10-CM

## 2023-12-23 DIAGNOSIS — Z8679 Personal history of other diseases of the circulatory system: Secondary | ICD-10-CM

## 2023-12-23 DIAGNOSIS — M810 Age-related osteoporosis without current pathological fracture: Secondary | ICD-10-CM

## 2023-12-23 DIAGNOSIS — G8929 Other chronic pain: Secondary | ICD-10-CM

## 2023-12-23 DIAGNOSIS — F5101 Primary insomnia: Secondary | ICD-10-CM

## 2023-12-23 DIAGNOSIS — M1611 Unilateral primary osteoarthritis, right hip: Secondary | ICD-10-CM

## 2023-12-23 LAB — BASIC METABOLIC PANEL WITH GFR
Anion gap: 8 (ref 5–15)
BUN: 5 mg/dL — ABNORMAL LOW (ref 8–23)
CO2: 25 mmol/L (ref 22–32)
Calcium: 7.7 mg/dL — ABNORMAL LOW (ref 8.9–10.3)
Chloride: 98 mmol/L (ref 98–111)
Creatinine, Ser: 0.52 mg/dL (ref 0.44–1.00)
GFR, Estimated: >60 mL/min (ref >60)
Glucose, Bld: 83 mg/dL (ref 70–99)
Potassium: 3.3 mmol/L — ABNORMAL LOW (ref 3.5–5.1)
Sodium: 131 mmol/L — ABNORMAL LOW (ref 135–145)

## 2023-12-23 LAB — MAGNESIUM: Magnesium: 1.5 mg/dL — ABNORMAL LOW (ref 1.7–2.4)

## 2023-12-23 MED ORDER — POTASSIUM CHLORIDE CRYS ER 20 MEQ PO TBCR
40.0000 meq | EXTENDED_RELEASE_TABLET | Freq: Once | ORAL | Status: AC
  Administered 2023-12-23: 40 meq via ORAL
  Filled 2023-12-23: qty 2

## 2023-12-23 MED ORDER — POTASSIUM CHLORIDE CRYS ER 20 MEQ PO TBCR
10.0000 meq | EXTENDED_RELEASE_TABLET | ORAL | 0 refills | Status: DC
  Filled 2023-12-23: qty 25, 100d supply, fill #0

## 2023-12-23 MED ORDER — SODIUM CHLORIDE 1 G PO TABS
1.0000 g | ORAL_TABLET | Freq: Two times a day (BID) | ORAL | 0 refills | Status: DC
  Filled 2023-12-23: qty 60, 30d supply, fill #0

## 2023-12-23 MED ORDER — MAGNESIUM OXIDE -MG SUPPLEMENT 400 (240 MG) MG PO TABS
800.0000 mg | ORAL_TABLET | Freq: Two times a day (BID) | ORAL | 0 refills | Status: AC
  Filled 2023-12-23: qty 28, 7d supply, fill #0

## 2023-12-23 NOTE — Progress Notes (Signed)
 Inpatient Rehabilitation Discharge Medication Review by a Pharmacist  A complete drug regimen review was completed for this patient to identify any potential clinically significant medication issues.  High Risk Drug Classes Is patient taking? Indication by Medication  Antipsychotic No   Anticoagulant No   Antibiotic Yes Cefadroxil  - cellulitis (skin flora coverage) - for 5 more days.  Opioid Yes PRN Oxycodone  - severe pain PRN Tramadol  - moderate pain  Antiplatelet No   Hypoglycemics/insulin No   Vasoactive Medication Yes Carvedilol  - hypertension Furosemide  - fluid balance Tamsulosin  - urinary retention  Chemotherapy No   Other Yes Atorvastatin  - hyperlipidemia Duloxetine  - pain Methotrexate , prednisone  - rheumatoid arthritis  Pantoprazole  - GERD Sodium chloride  - hyponatremia Magnesium  x 7 days, potassium chloride  QOD, Vitamin D , Vitamin C , Zinc , Biotin , folate - supplements Miralax , docusate, senna - laxatives Prolia  - osteoporosis  PRNs: Acetaminophen  - mild pain or fever Cyclobenzaprine , diazepam  - muscle spasms Trazodone  - sleep Bisacodyl  PR - constipation     Type of Medication Issue Identified Description of Issue Recommendation(s)  Drug Interaction(s) (clinically significant)     Duplicate Therapy     Allergy     No Medication Administration End Date     Incorrect Dose     Additional Drug Therapy Needed     Significant med changes from prior encounter (inform family/care partners about these prior to discharge). Aspirin  and Oxybutynin  discontinued.  New: Tamsulosin , Magnesium , potassium, sodium chloride , Vitamin C  and zinc  supplements.  [name]  paused. Communicate  changes with patient/family prior to discharge.  Other       Clinically significant medication issues were identified that warrant physician communication and completion of prescribed/recommended actions by midnight of the next day:  No  Pharmacist comments:  - prior overactive bladder >  then urinary retention. Plan outpatient Urology follow-up. - Cefadroxil  stop time in place  Time spent performing this drug regimen review (minutes):  [address]   Genaro Zebedee Calin, [place] 12/23/2023 10:51 AM

## 2023-12-23 NOTE — Plan of Care (Signed)
  Problem: RH Eating Goal: LTG Patient will perform eating w/assist, cues/equip (OT) Description: LTG: Patient will perform eating with assist, with/without cues using equipment (OT) Outcome: Not Met (add Reason) Note: Patient requires set up secondary to UE use and cognition    Problem: RH Balance Goal: LTG: Patient will maintain dynamic sitting balance (OT) Description: LTG:  Patient will maintain dynamic sitting balance with assistance during activities of daily living (OT) Outcome: Completed/Met Goal: LTG Patient will maintain dynamic standing with ADLs (OT) Description: LTG:  Patient will maintain dynamic standing balance with assist during activities of daily living (OT)  Outcome: Completed/Met   Problem: Sit to Stand Goal: LTG:  Patient will perform sit to stand in prep for activites of daily living with assistance level (OT) Description: LTG:  Patient will perform sit to stand in prep for activites of daily living with assistance level (OT) Outcome: Completed/Met   Problem: RH Grooming Goal: LTG Patient will perform grooming w/assist,cues/equip (OT) Description: LTG: Patient will perform grooming with assist, with/without cues using equipment (OT) Outcome: Completed/Met   Problem: RH Bathing Goal: LTG Patient will bathe all body parts with assist levels (OT) Description: LTG: Patient will bathe all body parts with assist levels (OT) Outcome: Completed/Met   Problem: RH Dressing Goal: LTG Patient will perform upper body dressing (OT) Description: LTG Patient will perform upper body dressing with assist, with/without cues (OT). Outcome: Completed/Met Goal: LTG Patient will perform lower body dressing w/assist (OT) Description: LTG: Patient will perform lower body dressing with assist, with/without cues in positioning using equipment (OT) Outcome: Completed/Met   Problem: RH Toileting Goal: LTG Patient will perform toileting task (3/3 steps) with assistance level  (OT) Description: LTG: Patient will perform toileting task (3/3 steps) with assistance level (OT)  Outcome: Completed/Met   Problem: RH Functional Use of Upper Extremity Goal: LTG Patient will use RT/LT upper extremity as a (OT) Description: LTG: Patient will use right/left upper extremity as a stabilizer/gross assist/diminished/nondominant/dominant level with assist, with/without cues during functional activity (OT) Outcome: Completed/Met   Problem: RH Toilet Transfers Goal: LTG Patient will perform toilet transfers w/assist (OT) Description: LTG: Patient will perform toilet transfers with assist, with/without cues using equipment (OT) Outcome: Completed/Met

## 2023-12-23 NOTE — Progress Notes (Signed)
 PROGRESS NOTE   Subjective/Complaints: Pt looking forward to going home today. No new complaints this AM.   ROS: as per HPI. Denies CP, SOB, abd pain, N/V/D, HA  + bladder incontinence/retention- No has foley cath  + Fatigue + Right leg pain   Objective:   No results found.   Recent Labs    12/21/23 0535  WBC 4.1  HGB 9.1*  HCT 27.5*  PLT 162     Recent Labs    12/22/23 0456 12/23/23 0432  NA 134* 131*  K 3.8 3.3*  CL 101 98  CO2 23 25  GLUCOSE 69* 83  BUN 11 5*  CREATININE 0.49 0.52  CALCIUM  7.9* 7.7*    Intake/Output Summary (Last 24 hours) at 12/23/2023 1028 Last data filed at 12/23/2023 0731 Gross per 24 hour  Intake 476 ml  Output 1425 ml  Net -949 ml        Physical Exam: Vital Signs Blood pressure 120/63, pulse (!) 101, temperature 98.2 F (36.8 C), temperature source Oral, resp. rate 18, height 5' (1.524 m), weight 47.6 kg, SpO2 93%.  General: laying in bed, NAD HEENT: Head is normocephalic, atraumatic, sclera anicteric, MMM Neck: Supple without JVD or lymphadenopathy Heart: Reg rate and rhythm. No murmurs rubs or gallops Chest: CTA bilaterally without wheezes, rales, or rhonchi; no distress Abdomen: Soft, non-tender, non-distended, bowel sounds positive. Extremities: No significant lower extremity edema Psych: calm and cooperative Skin: Thin skin, multiple skin tears and covered areas, scattered bruising.   Neuro: Awake and alert,  oriented  to person place, year, month.  sensation intact in all 4 extremities to LT, hard of hearing   MSK:  Arthritic changes noted in her hands RUE with splint and ace wrap R leg drain has been removed,  VAC has been changed to prevena Lifts bilateral upper extremities to gravity RLE- HF 3/5;  KE 4-/5, DF/PF 4/5 LLE- 5-/5 throughout   Image from 7/24  Assessment/Plan: 1. Functional deficits which require 3+ hours per day of interdisciplinary  therapy in a comprehensive inpatient rehab setting. Physiatrist is providing close team supervision and 24 hour management of active medical problems listed below. Physiatrist and rehab team continue to assess barriers to discharge/monitor patient progress toward functional and medical goals  Care Tool:  Bathing    Body parts bathed by patient: Right arm, Left upper leg, Left arm, Chest, Abdomen, Front perineal area, Right upper leg, Face, Left lower leg, Right lower leg   Body parts bathed by helper: Right lower leg, Left lower leg, Buttocks     Bathing assist Assist Level: Minimal Assistance - Patient > 75%     Upper Body Dressing/Undressing Upper body dressing   What is the patient wearing?: Pull over shirt    Upper body assist Assist Level: Set up assist    Lower Body Dressing/Undressing Lower body dressing      What is the patient wearing?: Pants, Incontinence brief     Lower body assist Assist for lower body dressing: Moderate Assistance - Patient 50 - 74%     Toileting Toileting Toileting Activity did not occur (Clothing management and hygiene only): N/A (no void or bm)  Toileting assist Assist for toileting: Maximal Assistance - Patient 25 - 49%     Transfers Chair/bed transfer  Transfers assist     Chair/bed transfer assist level: Minimal Assistance - Patient > 75%     Locomotion Ambulation   Ambulation assist   Ambulation activity did not occur: Safety/medical concerns  Assist level: Minimal Assistance - Patient > 75% Assistive device: Walker-rolling Max distance: 10   Walk 10 feet activity   Assist  Walk 10 feet activity did not occur: Safety/medical concerns  Assist level: Minimal Assistance - Patient > 75% Assistive device: Walker-rolling   Walk 50 feet activity   Assist Walk 50 feet with 2 turns activity did not occur: Safety/medical concerns         Walk 150 feet activity   Assist Walk 150 feet activity did not occur:  Safety/medical concerns         Walk 10 feet on uneven surface  activity   Assist Walk 10 feet on uneven surfaces activity did not occur: Safety/medical concerns         Wheelchair     Assist Is the patient using a wheelchair?: Yes Type of Wheelchair: Manual    Wheelchair assist level: Dependent - Patient 0%      Wheelchair 50 feet with 2 turns activity    Assist        Assist Level: Dependent - Patient 0%   Wheelchair 150 feet activity     Assist      Assist Level: Dependent - Patient 0%   Blood pressure 120/63, pulse (!) 101, temperature 98.2 F (36.8 C), temperature source Oral, resp. rate 18, height 5' (1.524 m), weight 47.6 kg, SpO2 93%.  Medical Problem List and Plan: 1. 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 She also had R elbow hardware removal and olecranon excision and triceps advancement 11/28/23.             -patient may not  shower due to VAC             -ELOS/Goals: 12-14 days- Superviison to min A WBAT with walker RLE-posterior hip precautions Partial weightbearing RUE okay to use walker per Dr. Edna- s/p R elbow hardware removal - Message ortho to confirm wt bearing use with walker- OK for weightbearing but no bending elbow-discussed with Dr. Edna, appreciate assistance.  Okay to remove splint when thermoplastic splint is ready.  Okay to remove this for wound care and hygiene otherwise needs to stay on.  -Continue CIR - Patient was seen by Kemp Hila Cockerham-possible suture removal Monday, plan for right upper extremity splint to be made today 12/11/2023 -Addendum: social work note indicates step-grandson committed suicide  - Patient seen by orthopedics  7/21 -15/7 schedule -Expected 7/30   - Patient was seen by surgery-JP removed, appears plan for continued wound VAC with change to Prevena on discharge. follow-up in [facility] office 7 days after discharge for  possible removal -DC home today  2.  Antithrombotics: -DVT/anticoagulation:  Mechanical:  Antiembolism stockings, knee (TED hose) Bilateral lower extremities Sequential compression devices, below knee Bilateral lower extremities             -antiplatelet therapy: Aspirin  81 mg held due to bleeding risk  3. Pain Management: Oxycodone  and Tylenol  as needed             - Continue Cymbalta  20 mg daily and Flexeril  10 mg at bedtime 4. Mood/Behavior/Sleep: ego  support             -antipsychotic agents: N/A 5. Neuropsych/cognition: This patient is intermittently capable of making decisions on her own behalf.  6. Skin/Wound Care: Routine pressure relief measures             -JP drain-per Ortho will discontinue when output < 10 cc per shift             -Continue negative pressure at 75 mmHg--can be exchanged to Prevena  - MASD-7/15 start Diflucan  200 mg then 100 mg x 6 days -Gerhardt's  -Diffuse ecchymoses- Platelets 330 CTM Routine labs, follow-up with derm/hematology outpatient. -ID pharm recommending 4 weeks abx from time of OR for elbow 8/2, finish cipro  for UTI then switch back to cefadroxil , CRP is elevated  7. Fluids/Electrolytes/Nutrition: Monitor I's and O's, routine labs             -Diet Heart Healthy-- SLP recc nectar thick- pt not agreeable  -Ensure High protein + Zinc  and vitamin C    8. Hypertension/ intermittent mild tachycardia: Continue Coreg  3.125 mg BID,              -7/18-26 BP controlled, continue to monitor -12/20/23 BPs a little soft this morning-- advised using Abd binder and LLE TED while sitting/standing, because BP laying down is 150s/70s this morning-- if still soft then can hold Coreg  this morning, but if better then still give Coreg . Nursing aware.  -7/30 BP stable, f/u PCP   Vitals:   12/20/23 1126 12/20/23 1345 12/20/23 1731 12/20/23 2002  BP: 105/61 123/67 114/67 122/70   12/21/23 0443 12/21/23 1559 12/21/23 1746 12/21/23 2004  BP: (!) 150/69 (!) 95/56 127/66  105/64   12/22/23 0527 12/22/23 1636 12/22/23 2042 12/23/23 0546  BP: (!) 134/57 133/70 121/60 120/63      9. Rheumatoid arthritis-continue home prednisone  5 mg daily and methotrexate  12.5mg  weekly- methotrexate  just restarted.   10. Microcytic anemia/acute blood loss anemia: monitor for signs of bleeding recheck CBC in a.m   -normal folate & B12, iron study suggestive of AOC, status post 3 units PRBC. 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 Constipation- senna 8.6mg  BID  - 7/16 reports improvement after multiple bowel movements yesterday  -7/17 LMB 7/15, continue to monitor  -7/18 miralrax daily, PRN sorbitol  -7/24 LBM documented 7/22, will increase to BID, consider additional medication tomorrow if no BM -12/19/23 pt stating LBM yesterday but not documented. Having nursing give PRN sorbitol  today to see if she has a BM today.  -12/20/23 LBM this morning or last night, after sorbitol ; monitor.  -7/30 LBM today, stable  13. Dysphagia, chronic  -Prior SLP eval re nectar thick, she has been refusing modification/nectar thick liquids -SLP has been consulted- she continues to decline interventions  14. Hypokalemia/Hyponatremia  -7/17 potassium x2 today, recheck tomorrow 7/18 K+ up to 4.5, NA lower at 128  -Check urine sodium/osmolality due to hyponatremia, asymptomatic,  Cr higher then her baseline at 0.8 today, HGB higher on 7/15 and 7/17 think she is a little fluid depleted, add NS 50ml/hr and monitor response  -Cr 0.57, Na 131, K 3.7. will stop fluids today and recheck Monday. Urine studies not done yet.  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  On salt tabs and furosemide  and protein supplement was started by nephrology.  Sodium a little higher at 131 today -12/19/23 Na 128, appreciate nephro support, recommending fluid restriction. Monitor. K 3.4, stable, receiving Klor 20mEq BID -12/20/23 Na 128 again today, renal starting urea  x4 doses (BID x2d), still on lasix  and salt tabs; monitor -7/28 order 30 mEq potassium repletion, sodium a little better at 129 today 7/29 K+ up to 3.8, NA up to 134, appreciate nephrology assistance. Discussed with nephrology- should be ok to DC home from renal perspective F/u outpatient nephrolgy - kdur today, will start low dose replacement every other day, f/u in 1 week with PCP for BMP  15. Nausea  -compazine  PRN -7/19 improved today, discussed with pharmacy to try to adjust medication timing.  Nursing order to try to do medications after breakfast   16. HLD: continue atorvastatin  10mg  nightly.   17. Overactive Bladder- cont Oxybutynin  5mg  nightly -12/13/23 pt now having urinary retention requiring caths. Will stop Oxybutinin, monitor off it for now, if still having retention then would get U/A and consider adding urecholine   -7/21 will add Flomax , check UA  -7/22 volumes appear little better, start Macrobid   -7/23 will ask nursing continue to check bladder scans/PVR  -7/25 will try low dose urocholine 5mg  TID for retention  -7/26-27/25 still needing cathing, monitor for now since just started meds -7/28 family and patient unable to do IC, will start Foley follow-up with urology -7/29 Continue foley, f/u urology  -Outpatient urology f/u 18. Suspected UTI -7/22 Increased retention, potentially a little more confused- suspect UTI Discussed with ID pharm, start macrobid - appreciate assistance. Follow cultures -7/23 ABX changed to cipro  250mg   BID was growing pseudomonas, cultures indicate it is sensitive-- followed by duricef to complete that course  19. Hypomagnesemia  -discussed with pharmacy 1g IV   -Discussed with pharmacy for dosing mg oxide- f/u with PCP for repeat labs. Advised f/u with PCP in a week for recheck    LOS: 15 days A FACE TO FACE EVALUATION WAS PERFORMED  Murray Collier 12/23/2023, 10:28 AM

## 2023-12-24 ENCOUNTER — Telehealth: Payer: Self-pay

## 2023-12-24 DIAGNOSIS — M4722 Other spondylosis with radiculopathy, cervical region: Secondary | ICD-10-CM | POA: Diagnosis not present

## 2023-12-24 DIAGNOSIS — R339 Retention of urine, unspecified: Secondary | ICD-10-CM | POA: Diagnosis not present

## 2023-12-24 DIAGNOSIS — Z860100 Personal history of colon polyps, unspecified: Secondary | ICD-10-CM | POA: Diagnosis not present

## 2023-12-24 DIAGNOSIS — Z7982 Long term (current) use of aspirin: Secondary | ICD-10-CM | POA: Diagnosis not present

## 2023-12-24 DIAGNOSIS — K219 Gastro-esophageal reflux disease without esophagitis: Secondary | ICD-10-CM | POA: Diagnosis not present

## 2023-12-24 DIAGNOSIS — M80051D Age-related osteoporosis with current pathological fracture, right femur, subsequent encounter for fracture with routine healing: Secondary | ICD-10-CM | POA: Diagnosis not present

## 2023-12-24 DIAGNOSIS — G35 Multiple sclerosis: Secondary | ICD-10-CM | POA: Diagnosis not present

## 2023-12-24 DIAGNOSIS — T84020D Dislocation of internal right hip prosthesis, subsequent encounter: Secondary | ICD-10-CM | POA: Diagnosis not present

## 2023-12-24 DIAGNOSIS — G4700 Insomnia, unspecified: Secondary | ICD-10-CM | POA: Diagnosis not present

## 2023-12-24 DIAGNOSIS — M80031D Age-related osteoporosis with current pathological fracture, right forearm, subsequent encounter for fracture with routine healing: Secondary | ICD-10-CM | POA: Diagnosis not present

## 2023-12-24 DIAGNOSIS — Z993 Dependence on wheelchair: Secondary | ICD-10-CM | POA: Diagnosis not present

## 2023-12-24 DIAGNOSIS — D692 Other nonthrombocytopenic purpura: Secondary | ICD-10-CM | POA: Diagnosis not present

## 2023-12-24 DIAGNOSIS — E876 Hypokalemia: Secondary | ICD-10-CM | POA: Diagnosis not present

## 2023-12-24 DIAGNOSIS — G5602 Carpal tunnel syndrome, left upper limb: Secondary | ICD-10-CM | POA: Diagnosis not present

## 2023-12-24 DIAGNOSIS — H9190 Unspecified hearing loss, unspecified ear: Secondary | ICD-10-CM | POA: Diagnosis not present

## 2023-12-24 DIAGNOSIS — E785 Hyperlipidemia, unspecified: Secondary | ICD-10-CM | POA: Diagnosis not present

## 2023-12-24 DIAGNOSIS — I10 Essential (primary) hypertension: Secondary | ICD-10-CM | POA: Diagnosis not present

## 2023-12-24 DIAGNOSIS — M4712 Other spondylosis with myelopathy, cervical region: Secondary | ICD-10-CM | POA: Diagnosis not present

## 2023-12-24 DIAGNOSIS — E871 Hypo-osmolality and hyponatremia: Secondary | ICD-10-CM | POA: Diagnosis not present

## 2023-12-24 DIAGNOSIS — Z87891 Personal history of nicotine dependence: Secondary | ICD-10-CM | POA: Diagnosis not present

## 2023-12-24 DIAGNOSIS — D649 Anemia, unspecified: Secondary | ICD-10-CM | POA: Diagnosis not present

## 2023-12-24 DIAGNOSIS — M0689 Other specified rheumatoid arthritis, multiple sites: Secondary | ICD-10-CM | POA: Diagnosis not present

## 2023-12-24 DIAGNOSIS — Z85828 Personal history of other malignant neoplasm of skin: Secondary | ICD-10-CM | POA: Diagnosis not present

## 2023-12-24 NOTE — Telephone Encounter (Signed)
 Wound - xero form and optifoam is ok   For folley cath - needs to call urology

## 2023-12-24 NOTE — Telephone Encounter (Signed)
 Copied from CRM #[id number]. Topic: General - Other >> Dec 24, 2023 12:59 PM Macario HERO wrote: Reason for CRM: Sharlet Sandman a home health aide from [facility] called to advised patient has a fully catheter that is really painful, need orders for wound bac ( need to know the frequency of changing). Patient has a wound on right knee; recommend xero form and optifoam covering. Call back: [phone]

## 2023-12-26 DIAGNOSIS — T84020D Dislocation of internal right hip prosthesis, subsequent encounter: Secondary | ICD-10-CM | POA: Diagnosis not present

## 2023-12-26 DIAGNOSIS — G5602 Carpal tunnel syndrome, left upper limb: Secondary | ICD-10-CM | POA: Diagnosis not present

## 2023-12-26 DIAGNOSIS — M4712 Other spondylosis with myelopathy, cervical region: Secondary | ICD-10-CM | POA: Diagnosis not present

## 2023-12-26 DIAGNOSIS — M0689 Other specified rheumatoid arthritis, multiple sites: Secondary | ICD-10-CM | POA: Diagnosis not present

## 2023-12-26 DIAGNOSIS — E876 Hypokalemia: Secondary | ICD-10-CM | POA: Diagnosis not present

## 2023-12-26 DIAGNOSIS — R339 Retention of urine, unspecified: Secondary | ICD-10-CM | POA: Diagnosis not present

## 2023-12-26 DIAGNOSIS — M4722 Other spondylosis with radiculopathy, cervical region: Secondary | ICD-10-CM | POA: Diagnosis not present

## 2023-12-26 DIAGNOSIS — H9190 Unspecified hearing loss, unspecified ear: Secondary | ICD-10-CM | POA: Diagnosis not present

## 2023-12-26 DIAGNOSIS — M80051D Age-related osteoporosis with current pathological fracture, right femur, subsequent encounter for fracture with routine healing: Secondary | ICD-10-CM | POA: Diagnosis not present

## 2023-12-26 DIAGNOSIS — K219 Gastro-esophageal reflux disease without esophagitis: Secondary | ICD-10-CM | POA: Diagnosis not present

## 2023-12-26 DIAGNOSIS — G35 Multiple sclerosis: Secondary | ICD-10-CM | POA: Diagnosis not present

## 2023-12-26 DIAGNOSIS — Z860100 Personal history of colon polyps, unspecified: Secondary | ICD-10-CM | POA: Diagnosis not present

## 2023-12-26 DIAGNOSIS — D649 Anemia, unspecified: Secondary | ICD-10-CM | POA: Diagnosis not present

## 2023-12-26 DIAGNOSIS — Z85828 Personal history of other malignant neoplasm of skin: Secondary | ICD-10-CM | POA: Diagnosis not present

## 2023-12-26 DIAGNOSIS — D692 Other nonthrombocytopenic purpura: Secondary | ICD-10-CM | POA: Diagnosis not present

## 2023-12-26 DIAGNOSIS — Z7982 Long term (current) use of aspirin: Secondary | ICD-10-CM | POA: Diagnosis not present

## 2023-12-26 DIAGNOSIS — Z87891 Personal history of nicotine dependence: Secondary | ICD-10-CM | POA: Diagnosis not present

## 2023-12-26 DIAGNOSIS — M80031D Age-related osteoporosis with current pathological fracture, right forearm, subsequent encounter for fracture with routine healing: Secondary | ICD-10-CM | POA: Diagnosis not present

## 2023-12-26 DIAGNOSIS — G4700 Insomnia, unspecified: Secondary | ICD-10-CM | POA: Diagnosis not present

## 2023-12-26 DIAGNOSIS — I10 Essential (primary) hypertension: Secondary | ICD-10-CM | POA: Diagnosis not present

## 2023-12-26 DIAGNOSIS — E785 Hyperlipidemia, unspecified: Secondary | ICD-10-CM | POA: Diagnosis not present

## 2023-12-26 DIAGNOSIS — Z993 Dependence on wheelchair: Secondary | ICD-10-CM | POA: Diagnosis not present

## 2023-12-26 DIAGNOSIS — E871 Hypo-osmolality and hyponatremia: Secondary | ICD-10-CM | POA: Diagnosis not present

## 2023-12-27 ENCOUNTER — Encounter: Payer: Self-pay | Admitting: Internal Medicine

## 2023-12-27 DIAGNOSIS — R339 Retention of urine, unspecified: Secondary | ICD-10-CM | POA: Insufficient documentation

## 2023-12-27 NOTE — Progress Notes (Unsigned)
 Subjective:    Patient ID: Sherri  [name], female    DOB: Dec 28, 1940, 83 y.o.   MRN: [id number]     HPI Sherri [name]  is here for follow up from the hospital  Admitted to inpatient rehab 7/15 - 7/30  She has had multiple hospitalizations over the past 2-3 months.   R olecranon fx (5/14), right femoral neck fx (6/17), right THA (6/19), dislocated THA (6/22), closed reduction right THA (6/23), and ORIF right olecranon (6/27)   Went to ED 7/3 due to exposed hard ware in right elbow from surgery 1 week prior.  Xray showed fx of right elbow with malpositioned screw  - ortho performed and I& D and hardware removal, olecranon excision and triceps advancement on 7/5.  IV for cellulitis => doxy and duricef x 4-6 weeks.  She had hyponatremia, hgb 8.1 => 6.8.  she received 3 units prbcs.  Workup of anemia showed likely anemia of chronic disease.  She did have significant skin disease-possibly fungal and was started on Diflucan  in rehab.  For constipation was given Colace.  Right leg was noticed to be shorter, xray showed right hip dislocation.  Closed hip reduction attempted but was unsuccessful, she ended up having right total hip arthroplasty revision with head, ball and liner change.   Discharged with foley due to urinary retention.    She requires max assist - 2 for physical assistance.  Uses rolling walker.  She was admitted to inpatient rehab.   She had PT, OT and ST. Advised thickened liquids by ST, but she preferred a regular diet.  On discharge she needed minimal assist for transfers.   Pain management-discharged on oxycodone , Tylenol .  Also on Cymbalta  20 mg daily and Flexeril  10 mg at bedtime  She is here with her home aide.   She has been home just over one week.    Pain in manageable.  Tramadol  as needed - has not taken one recently.  Take sone oxycodone  at night.  Flexeril  at HS.  Has help 24 x 7.  She is doing PT, ST and OT at home.  She has a nurse that came - checked her wounds  and changed the bandage.  Has chronic skin tears - RLE - not healing.    Medications and allergies reviewed with patient and updated if appropriate.  Current Outpatient Medications on File Prior to Visit  Medication Sig Dispense Refill   acetaminophen  (TYLENOL ) 325 MG tablet Take 2 tablets (650 mg total) by mouth every 6 (six) hours as needed for mild pain (pain score 1-3) or fever (or Fever >/= 101). 20 tablet 0   ascorbic acid  (VITAMIN C ) 1000 MG tablet Take 1 tablet (1,000 mg total) by mouth daily. 100 tablet 0   atorvastatin  (LIPITOR) 10 MG tablet Take 1 tablet (10 mg total) by mouth every evening. 30 tablet 0   Biotin  5000 MCG TABS Take 10,000 mcg by mouth in the morning.     bisacodyl  (DULCOLAX) 10 MG suppository Place 1 suppository (10 mg total) rectally daily as needed for moderate constipation. 12 suppository 0   Carboxymethylcellul-Glycerin  (LUBRICATING EYE DROPS OP) Place 1 drop into both eyes daily as needed (dry eyes).     cholecalciferol  (VITAMIN D ) 25 MCG (1000 UNIT) tablet Take 1,000 Units by mouth in the morning.     cyclobenzaprine  (FLEXERIL ) 10 MG tablet TAKE 1 TABLET BY MOUTH THREE TIMES A DAY AS NEEDED FOR MUSCLE SPASM 90 tablet 2   denosumab  (PROLIA )  60 MG/ML SOSY injection Inject 60 mg into the skin every 6 (six) months. Deliver to rheum: [address], [address], [place], [place] [address]. Appt on 07/23/2022 1 mL 0   diazepam  (VALIUM ) 5 MG tablet TAKE 1 TABLET BY MOUTH AT BEDTIME AS NEEDED FOR MUSCLE SPASMS. 30 tablet 5   docusate sodium  (COLACE) 100 MG capsule Take 1 capsule (100 mg total) by mouth daily. 10 capsule 0   DULoxetine  (CYMBALTA ) 20 MG capsule Take 20 mg by mouth at bedtime.     folic acid  (FOLVITE ) 1 MG tablet Take 1 tablet (1 mg total) by mouth daily. 90 tablet 3   furosemide  (LASIX ) 20 MG tablet Take 0.5 tablets (10 mg total) by mouth daily. 30 tablet 0   magnesium  oxide (MAG-OX) 400 (240 Mg) MG tablet Take 2 tablets (800 mg total) by mouth 2 (two) times  daily for 7 days. 28 tablet 0   methotrexate  (RHEUMATREX) 2.5 MG tablet Take 5 tablets (12.5 mg total) by mouth once a week. Caution:Chemotherapy. Protect from light. 60 tablet 0   Nystatin (GERHARDT'S BUTT CREAM) CREA Apply 1 Application topically 3 (three) times daily. 60 each 0   oxyCODONE  (ROXICODONE ) 5 MG immediate release tablet Take 1 tablet (5 mg total) by mouth every 4 (four) hours as needed for severe pain (pain score 7-10). 24 tablet 0   pantoprazole  (PROTONIX ) 40 MG tablet Take 1 tablet (40 mg total) by mouth 2 (two) times daily before a meal. 60 tablet 5   polyethylene glycol powder (GLYCOLAX /MIRALAX ) 17 GM/SCOOP powder Take 17 g by mouth 2 (two) times daily. 238 g 0   potassium chloride  SA (KLOR-CON  M) 20 MEQ tablet Take 0.5 tablets (10 mEq total) by mouth every other day. 25 tablet 0   predniSONE  (DELTASONE ) 5 MG tablet TAKE 1 TABLET BY MOUTH EVERY DAY IN THE MORNING 90 tablet 0   senna (SENOKOT) 8.6 MG tablet Take 1 tablet by mouth 2 (two) times daily.     sodium chloride  1 g tablet Take 1 tablet (1 g total) by mouth 2 (two) times daily with a meal. 60 tablet 0   tamsulosin  (FLOMAX ) 0.4 MG CAPS capsule Take 1 capsule (0.4 mg total) by mouth daily after supper. 30 capsule 0   traMADol  (ULTRAM ) 50 MG tablet Take 25-50 mg by mouth every 8 (eight) hours as needed for moderate pain (pain score 4-6).     [Paused] Upadacitinib  ER ([name] ) 15 MG TB24 Take 15 mg by mouth daily.     Zinc  Sulfate 220 (50 Zn) MG TABS Take 1 tablet (220 mg total) by mouth daily with supper. 100 tablet 0   No current facility-administered medications on file prior to visit.     Review of Systems  Constitutional:  Negative for appetite change and fever.  Respiratory:  Positive for cough (with eating), shortness of breath (at times) and wheezing (a little two days ago). Negative for chest tightness.   Cardiovascular:  Positive for leg swelling (both legs - chronic). Negative for chest pain and palpitations.   Gastrointestinal:  Positive for constipation (controlled). Negative for abdominal pain.  Musculoskeletal:        Right elbow pain, right hip pain  Neurological:  Positive for light-headedness. Negative for headaches.  Psychiatric/Behavioral:  Negative for sleep disturbance (ok with medications).        Objective:   Vitals:   12/31/23 1036  BP: 104/64  Pulse: 72  Temp: 98.3 F (36.8 C)  SpO2: 98%  BP Readings from Last 3 Encounters:  12/31/23 104/64  12/23/23 120/63  12/08/23 130/80   Wt Readings from Last 3 Encounters:  12/22/23 104 lb 15 oz (47.6 kg)  12/03/23 107 lb 2.3 oz (48.6 kg)  11/20/23 (P) 110 lb (49.9 kg)   Body mass index is 20.49 kg/m.    Physical Exam Constitutional:      General: She is not in acute distress.    Appearance: Normal appearance. She is not ill-appearing.  HENT:     Head: Normocephalic and atraumatic.  Cardiovascular:     Rate and Rhythm: Normal rate and regular rhythm.  Pulmonary:     Effort: Pulmonary effort is normal. No respiratory distress.     Breath sounds: Normal breath sounds. No wheezing or rales.  Musculoskeletal:     Cervical back: Tenderness present.     Right lower leg: Edema (2 + pitting edema, clear fluid leaking from open wounds) present.     Left lower leg: Edema (trace) present.  Skin:    General: Skin is warm and dry.     Comments: Skin tears right anterior distal upper leg, 3 skin tears proximal lower leg.  Chronic discoloration right lower leg  Neurological:     Mental Status: She is alert.  Psychiatric:        Mood and Affect: Mood normal.        Lab Results  Component Value Date   WBC 4.1 12/21/2023   HGB 9.1 (L) 12/21/2023   HCT 27.5 (L) 12/21/2023   PLT 162 12/21/2023   GLUCOSE 83 12/23/2023   CHOL 238 (H) 08/06/2023   TRIG 53 08/06/2023   HDL 109 08/06/2023   LDLDIRECT 122.9 11/15/2012   LDLCALC 115 (H) 08/06/2023   ALT 10 12/09/2023   AST 18 12/09/2023   NA 131 (L) 12/23/2023   K 3.3 (L)  12/23/2023   CL 98 12/23/2023   CREATININE 0.52 12/23/2023   BUN 5 (L) 12/23/2023   CO2 25 12/23/2023   TSH 3.196 11/27/2023   INR 1.1 10/02/2023   HGBA1C 6.0 (H) 08/06/2023   DG HIP UNILAT WITH PELVIS 2-3 VIEWS RIGHT CLINICAL DATA:  Status post revision of right hip arthroplasty.  EXAM: DG HIP (WITH OR WITHOUT PELVIS) 2-3V RIGHT  COMPARISON:  Preoperative radiographs  FINDINGS: Revision right hip arthroplasty in expected alignment. No periprosthetic lucency or fracture. Recent postsurgical change includes air and edema in the soft tissues. Left hip arthroplasty is partially included, intact were visualized.  IMPRESSION: Revision right hip arthroplasty without immediate postoperative complication.  Electronically Signed   By: Andrea Gasman M.D.   On: 12/16/2023 13:37    Assessment & Plan:    See Problem List for Assessment and Plan of chronic medical problems.

## 2023-12-28 DIAGNOSIS — E871 Hypo-osmolality and hyponatremia: Secondary | ICD-10-CM | POA: Diagnosis not present

## 2023-12-28 DIAGNOSIS — M4712 Other spondylosis with myelopathy, cervical region: Secondary | ICD-10-CM | POA: Diagnosis not present

## 2023-12-28 DIAGNOSIS — E785 Hyperlipidemia, unspecified: Secondary | ICD-10-CM | POA: Diagnosis not present

## 2023-12-28 DIAGNOSIS — K219 Gastro-esophageal reflux disease without esophagitis: Secondary | ICD-10-CM | POA: Diagnosis not present

## 2023-12-28 DIAGNOSIS — D649 Anemia, unspecified: Secondary | ICD-10-CM | POA: Diagnosis not present

## 2023-12-28 DIAGNOSIS — T84020D Dislocation of internal right hip prosthesis, subsequent encounter: Secondary | ICD-10-CM | POA: Diagnosis not present

## 2023-12-28 DIAGNOSIS — E876 Hypokalemia: Secondary | ICD-10-CM | POA: Diagnosis not present

## 2023-12-28 DIAGNOSIS — D692 Other nonthrombocytopenic purpura: Secondary | ICD-10-CM | POA: Diagnosis not present

## 2023-12-28 DIAGNOSIS — M0689 Other specified rheumatoid arthritis, multiple sites: Secondary | ICD-10-CM | POA: Diagnosis not present

## 2023-12-28 DIAGNOSIS — M80051D Age-related osteoporosis with current pathological fracture, right femur, subsequent encounter for fracture with routine healing: Secondary | ICD-10-CM | POA: Diagnosis not present

## 2023-12-28 DIAGNOSIS — H9190 Unspecified hearing loss, unspecified ear: Secondary | ICD-10-CM | POA: Diagnosis not present

## 2023-12-28 DIAGNOSIS — R339 Retention of urine, unspecified: Secondary | ICD-10-CM | POA: Diagnosis not present

## 2023-12-28 DIAGNOSIS — Z993 Dependence on wheelchair: Secondary | ICD-10-CM | POA: Diagnosis not present

## 2023-12-28 DIAGNOSIS — I10 Essential (primary) hypertension: Secondary | ICD-10-CM | POA: Diagnosis not present

## 2023-12-28 DIAGNOSIS — M4722 Other spondylosis with radiculopathy, cervical region: Secondary | ICD-10-CM | POA: Diagnosis not present

## 2023-12-28 DIAGNOSIS — Z85828 Personal history of other malignant neoplasm of skin: Secondary | ICD-10-CM | POA: Diagnosis not present

## 2023-12-28 DIAGNOSIS — G4700 Insomnia, unspecified: Secondary | ICD-10-CM | POA: Diagnosis not present

## 2023-12-28 DIAGNOSIS — Z7982 Long term (current) use of aspirin: Secondary | ICD-10-CM | POA: Diagnosis not present

## 2023-12-28 DIAGNOSIS — M80031D Age-related osteoporosis with current pathological fracture, right forearm, subsequent encounter for fracture with routine healing: Secondary | ICD-10-CM | POA: Diagnosis not present

## 2023-12-28 DIAGNOSIS — Z860100 Personal history of colon polyps, unspecified: Secondary | ICD-10-CM | POA: Diagnosis not present

## 2023-12-28 DIAGNOSIS — G35 Multiple sclerosis: Secondary | ICD-10-CM | POA: Diagnosis not present

## 2023-12-28 DIAGNOSIS — G5602 Carpal tunnel syndrome, left upper limb: Secondary | ICD-10-CM | POA: Diagnosis not present

## 2023-12-28 DIAGNOSIS — Z87891 Personal history of nicotine dependence: Secondary | ICD-10-CM | POA: Diagnosis not present

## 2023-12-29 ENCOUNTER — Ambulatory Visit: Admitting: Physician Assistant

## 2023-12-29 DIAGNOSIS — Z96641 Presence of right artificial hip joint: Secondary | ICD-10-CM | POA: Diagnosis not present

## 2023-12-29 DIAGNOSIS — Z471 Aftercare following joint replacement surgery: Secondary | ICD-10-CM | POA: Diagnosis not present

## 2023-12-29 NOTE — Progress Notes (Deleted)
 Office Visit Note  Patient: Sherri [name]             Date of Birth: 06-Jun-1940           MRN: [id number]             PCP: [name] PARAS, MD Referring: [name] PARAS, MD Visit Date: 12/30/2023 Occupation: @GUAROCC @  Subjective:    History of Present Illness: Sherri [name] is a 83 y.o. female with history of rheumatoid arthritis, osteoarthritis, and osteoporosis.     Patient was discharged from the hospital on November 08, 2023 after right elbow visible hardware with wound dehiscence and cellulitis.  She was treated with 2 weeks of IV antibiotics.  She was discharged on aspirin .  She also had superior dislocation of the right hip arthroplasty with closed reduction on July 7.    Activities of Daily Living:  Patient reports morning stiffness for *** {minute/hour:19697}.   Patient {ACTIONS;DENIES/REPORTS:21021675::Denies} nocturnal pain.  Difficulty dressing/grooming: {ACTIONS;DENIES/REPORTS:21021675::Denies} Difficulty climbing stairs: {ACTIONS;DENIES/REPORTS:21021675::Denies} Difficulty getting out of chair: {ACTIONS;DENIES/REPORTS:21021675::Denies} Difficulty using hands for taps, buttons, cutlery, and/or writing: {ACTIONS;DENIES/REPORTS:21021675::Denies}  No Rheumatology ROS completed.   PMFS History:  Patient Active Problem List   Diagnosis Date Noted   Urinary retention 12/27/2023   Debility 12/08/2023   History of revision of total replacement of right hip joint 12/08/2023   Postoperative wound infection 12/08/2023   Closed fracture of right elbow 12/08/2023   Wound dehiscence 11/27/2023   Macrocytic anemia 11/27/2023   Cellulitis of right elbow 11/27/2023   Lightheadedness 11/10/2023   Physical deconditioning 11/10/2023   Parotid swelling 11/10/2023   Infected laceration of skin 11/10/2023   Skin tear of elbow without complication, right, initial encounter 11/10/2023   Hip fracture (HCC) 11/10/2023   Sepsis due to cellulitis (HCC) 10/03/2023    Cellulitis 10/03/2023   Pressure injury of skin 10/03/2023   Hyponatremia 10/02/2023   Fall at home, initial encounter 10/02/2023   Cellulitis of right lower extremity 10/02/2023   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   Cervical spondylosis with myelopathy and radiculopathy 04/23/2022   Constipation 03/04/2022   Upper back pain on left side 03/04/2022   Lumbar vertebral fracture, pathologic 09/20/2021   Degenerative spondylolisthesis 08/19/2021   Acute left lumbar radiculopathy 07/05/2021   Senile purpura (HCC) 12/19/2020   Restless leg 02/02/2020   Bilateral leg edema 01/11/2020   Cervical radiculopathy at C8 06/28/2019   Carpal tunnel syndrome on both sides 06/28/2019   Right sided sciatica 04/14/2018   Trochanteric bursitis of right hip 04/14/2018   H/O cervical spine surgery 11/10/2017   Dysphagia 10/26/2017   Gastroesophageal reflux disease 08/06/2017   Easy bruising 08/06/2017   Mid back pain 03/26/2017   Submandibular gland swelling 01/08/2017   High risk medication use 09/02/2016   Piriformis muscle pain 09/02/2016   Right hip pain 04/08/2016   Right knee pain 02/25/2016   Numbness and tingling in left hand 11/14/2014   Biceps tendon tear 10/10/2014   Insomnia 10/10/2014   Gait disturbance 06/13/2014   OP (osteoporosis) 01/18/2014   Other long term (current) drug therapy 01/18/2014   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 01/01/2013   History of colonic polyps 07/03/2010   Nocturia 05/28/2010   THYROID  NODULE 05/17/2008   Multiple sclerosis (HCC) 05/17/2008   Other fatigue 03/30/2007   Hyperlipidemia 08/20/2006   Essential hypertension 08/20/2006   Rheumatoid arthritis (HCC) 08/20/2006   Osteoarthritis 08/20/2006   CERVICAL CANCER, HX OF 08/20/2006  GASTRIC ULCER, HX OF 08/20/2006    Past Medical History:  Diagnosis Date   Anxiety    takes Valium  daily as needed   Basal cell carcinoma 01/21/1988   left nostril (MOHS), sup-right  calf (CX35FU)   Basal cell carcinoma 03/22/1996   right calf (CX35FU)   Basal cell carcinoma 03/31/1995   left wing nose   Bruises easily    d/t meds   Cancer (HCC)    basal cell ca, in situ- uterine    Cataracts, bilateral    removed bilateral   Chronic back pain    Dizziness    r/t to meds   GERD (gastroesophageal reflux disease)    no meds on a regular basis but will take Tums if needed   Headache(784.0)    r/t neck issues   History of bronchitis 6-76yrs ago   History of colon polyps    benign   HOH (hard of hearing)    wears hearing aids   Hyperlipidemia    takes Atorvastatin  on Mondays and Fridays   Hypertension    Joint pain    Joint swelling    Multiple sclerosis (HCC)    Neuromuscular disorder (HCC)    Dr. FABIENE Crete- [facility], follows [facility]   Nocturia    PONV (postoperative nausea and vomiting)    trouble urinating after surgery in 2014   Postoperative nausea and vomiting 01/11/2019   Rheumatoid arthritis (HCC)    Dr Lorrayne weekly, RA- hands- knees- feet    Rheumatoid arthritis(714.0)    Dr Dolphus takes Xeljanz  daily   Right wrist fracture    Skin cancer    Squamous cell carcinoma of skin 11/02/2001   in situ-right knee (cx68fu)   Squamous cell carcinoma of skin 11/12/2011   in situ-left forearm (CX35FU), in situ-left foot (CX35FU)   Squamous cell carcinoma of skin 01/22/2012   in situ-left lower forearm (txpbx)   Squamous cell carcinoma of skin 11/17/2012   right shin (txpbx)   Squamous cell carcinoma of skin 10/28/2013   in situ-right shoulder (CX35FU)   Squamous cell carcinoma of skin 04/28/2014   well diff-right shin (txpbx)   Squamous cell carcinoma of skin 11/02/2014   in situ-Left shin (txpbx)   Squamous cell carcinoma of skin 12/15/2014   in situ-Left hand (txpbx), bowens-left side chest (txpbx)   Squamous cell carcinoma of skin 05/09/2015   in situ-left hand (txpbx)    Squamous cell carcinoma of skin 05/07/2016   in  situ-left inner shin,ant, in situ-left inner shin, post, in situ-left outer forearm, in situ-right knuckle   Squamous cell carcinoma of skin 03/1302018   in situ-left shoulder (txpbx), in situ-right inner shin (txpbx), in situ-top of left foot (txpbx), KA- left forearm (txpbx)   Squamous cell carcinoma of skin 12/02/2016   in situ-left inner shin (txpbx)   Squamous cell carcinoma of skin 03/04/2017   in situ-left outer sup, shin (txpbx), in situ- right 2nd knuckle finger (txpbx), in situ- Left wrist (txpbx)   Squamous cell carcinoma of skin 04/06/2018   in situ-above left knee inner (txpbx)   Squamous cell carcinoma of skin 04/21/2018   in situ-right top hand (txpbx)   Squamous cell carcinoma of skin 07/08/2018   in situ-right lower inner shin (txpbx)   Squamous cell carcinoma of skin 04/27/2019   in situ- Left neck(CX35FU), In situ- right neck (CX35FU)   Urinary retention    sees Dr.Wrenn about 2 times a yr    Family History  Problem Relation Age of Onset   Cancer Mother        pancreatic   Diabetes Mother    Pancreatic cancer Mother    Heart disease Father        Rheumatic   Cancer Father        ? stomach   Stomach cancer Father    Cancer Sister        stomach   Stomach cancer Sister    Cancer Maternal Aunt        X 4; ? primary   Heart disease Paternal Aunt    Cancer Maternal Grandmother        cervical   Colon cancer Other        Aunts   Multiple sclerosis Daughter    Hypertension Neg Hx    Stroke Neg Hx    Esophageal cancer Neg Hx    Rectal cancer Neg Hx    Past Surgical History:  Procedure Laterality Date   ABDOMINAL HYSTERECTOMY     1985   ANTERIOR CERVICAL DECOMP/DISCECTOMY FUSION N/A 04/23/2022   Procedure: Anterior Cervical Decompression/Discectomy Fusion - Cervical Seven-Thoracic One;  Surgeon: Louis Shove, MD;  Location: [facility];  Service: Neurosurgery;  Laterality: N/A;  3C   APPENDECTOMY     with TAH   BACK SURGERY     several   BREAST BIOPSY Right     Results were negative   BROW LIFT Bilateral 10/02/2016   Procedure: BILATERAL LOWER LID BLEPHAROPLASTY;  Surgeon: Laurie Loyd Redhead, MD;  Location: [facility];  Service: Plastics;  Laterality: Bilateral;   CARPAL TUNNEL RELEASE Right    cataracts     CERVICAL FUSION     Dr Leeann   CERVICAL FUSION  12/31/2012   Dr Leeann   CERVICAL FUSION  04/2022   CHEST TUBE INSERTION     for traumatic Pneumothorax   COLONOSCOPY  07/16/2010   normal    COLONOSCOPY W/ POLYPECTOMY  1997   negative since; Dr Debrah   ESOPHAGOGASTRODUODENOSCOPY  07/16/2010   normal   eye lid raise     EYE SURGERY Bilateral    cataracts removed - /w IOL   HARDWARE REMOVAL  08/2021   HIP CLOSED REDUCTION Right 11/16/2023   Procedure: CLOSED MANIPULATION, JOINT, HIP;  Surgeon: Ernie Cough, MD;  Location: [facility];  Service: Orthopedics;  Laterality: Right;   HIP CLOSED REDUCTION N/A 11/30/2023   Procedure: CLOSED REDUCTION, HIP;  Surgeon: Fidel Rogue, MD;  Location: [facility];  Service: Orthopedics;  Laterality: N/A;   INCISION AND DRAINAGE OF WOUND Right 11/28/2023   Procedure: IRRIGATION AND DEBRIDEMENT WOUND; REMOVAL OF HARDWARE;  Surgeon: Edna Toribio LABOR, MD;  Location: [facility];  Service: Orthopedics;  Laterality: Right;  I & D RIGHT ELBOW   JOINT REPLACEMENT     LUMBAR FUSION     Dr Leeann   NASAL SINUS SURGERY     ORIF ELBOW FRACTURE Right 11/20/2023   Procedure: OPEN REDUCTION INTERNAL FIXATION (ORIF) ELBOW/OLECRANON FRACTURE;  Surgeon: [name] Franky SQUIBB, MD;  Location: [facility];  Service: Orthopedics;  Laterality: Right;   POSTERIOR CERVICAL FUSION/FORAMINOTOMY N/A 12/31/2012   Procedure: POSTERIOR LATERAL CERVICAL FUSION/FORAMINOTOMY LEVEL 1 CERVICAL THREE-FOUR WITH LATERAL MASS SCREWS;  Surgeon: Catalina CHRISTELLA Leeann, MD;  Location: [facility];  Service: Neurosurgery;  Laterality: N/A;   PTOSIS REPAIR Bilateral 10/02/2016   Procedure: INTERNAL PTOSIS REPAIR;  Surgeon: Laurie Loyd Redhead, MD;  Location: [facility];  Service:  Plastics;  Laterality: Bilateral;  SKIN BIOPSY     THYROID  SURGERY     R lobe removed- 1972, has grown back - CT last done- 2018   TOTAL HIP ARTHROPLASTY  12/22/2011   Procedure: TOTAL HIP ARTHROPLASTY;  Surgeon: Dempsey JINNY Sensor, MD;  Location: [facility];  Service: Orthopedics;  Laterality: Left;   TOTAL HIP ARTHROPLASTY Right 11/12/2023   Procedure: ARTHROPLASTY, HIP, TOTAL, ANTERIOR APPROACH;  Surgeon: Fidel Rogue, MD;  Location: [facility];  Service: Orthopedics;  Laterality: Right;   TOTAL HIP REVISION Right 12/03/2023   Procedure: TOTAL HIP REVISION;  Surgeon: Fidel Rogue, MD;  Location: [facility];  Service: Orthopedics;  Laterality: Right;  Right acetabular revision Anterior approach   TOTAL SHOULDER ARTHROPLASTY     TUBAL LIGATION     UPPER GASTROINTESTINAL ENDOSCOPY  2012   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   WRIST SURGERY Left 09/16/2022   wrist/arm   Social History   Social History Narrative   Not on file   Immunization History  Administered Date(s) Administered   Fluad Quad(high Dose 65+) 02/02/2020, 02/01/2022   Influenza Whole 02/22/2007, 02/08/2013   Influenza, High Dose Seasonal PF 02/04/2015, 02/18/2017, 02/21/2018, 01/16/2019, 01/25/2021, 02/02/2023   Influenza-Unspecified 01/24/2014, 01/25/2016, 02/18/2017, 01/16/2019, 01/25/2020   Moderna Covid-19 Fall Seasonal Vaccine 44yrs & older 02/02/2023, 09/07/2023   Moderna Covid-19 Vaccine Bivalent Booster 77yrs & up 02/11/2022   PFIZER(Purple Top)SARS-COV-2 Vaccination 06/18/2019, 07/09/2019, 01/09/2020, 07/03/2020, 02/07/2021   PNEUMOCOCCAL CONJUGATE-20 02/26/2022   Pfizer Covid-19 Vaccine Bivalent Booster 74yrs & up 02/08/2021   Pneumococcal Conjugate-13 11/23/2012, 09/08/2014   Pneumococcal Polysaccharide-23 05/26/2006   Pneumococcal-Unspecified 12/28/2012, 06/09/2020   Respiratory Syncytial Virus Vaccine,Recomb Aduvanted(Arexvy) 01/23/2022   Td 05/28/2010   Tdap 12/24/2020, 01/07/2021   Unspecified SARS-COV-2  Vaccination 06/26/2020, 02/11/2022   Varicella 11/07/2020   Zoster Recombinant(Shingrix) 02/10/2019, 04/14/2019   Zoster, Live 05/26/2009, 07/10/2020     Objective: Vital Signs: There were no vitals taken for this visit.   Physical Exam Vitals and nursing note reviewed.  Constitutional:      Appearance: She is well-developed.  HENT:     Head: Normocephalic and atraumatic.  Eyes:     Conjunctiva/sclera: Conjunctivae normal.  Cardiovascular:     Rate and Rhythm: Normal rate and regular rhythm.     Heart sounds: Normal heart sounds.  Pulmonary:     Effort: Pulmonary effort is normal.     Breath sounds: Normal breath sounds.  Abdominal:     General: Bowel sounds are normal.     Palpations: Abdomen is soft.  Musculoskeletal:     Cervical back: Normal range of motion.  Lymphadenopathy:     Cervical: No cervical adenopathy.  Skin:    General: Skin is warm and dry.     Capillary Refill: Capillary refill takes less than 2 seconds.  Neurological:     Mental Status: She is alert and oriented to person, place, and time.  Psychiatric:        Behavior: Behavior normal.      Musculoskeletal Exam: ***  CDAI Exam: CDAI Score: -- Patient Global: --; Provider Global: -- Swollen: --; Tender: -- Joint Exam 12/30/2023   No joint exam has been documented for this visit   There is currently no information documented on the homunculus. Go to the Rheumatology activity and complete the homunculus joint exam.  Investigation: No additional findings.  Imaging: DG HIP UNILAT WITH PELVIS 2-3 VIEWS RIGHT Result Date: 12/16/2023 CLINICAL DATA:  Status post revision of right hip arthroplasty. EXAM: DG  HIP (WITH OR WITHOUT PELVIS) 2-3V RIGHT COMPARISON:  Preoperative radiographs FINDINGS: Revision right hip arthroplasty in expected alignment. No periprosthetic lucency or fracture. Recent postsurgical change includes air and edema in the soft tissues. Left hip arthroplasty is partially included,  intact were visualized. IMPRESSION: Revision right hip arthroplasty without immediate postoperative complication. Electronically Signed   By: Andrea Gasman M.D.   On: 12/16/2023 13:37   DG Elbow 2 Views Right Result Date: 12/14/2023 CLINICAL DATA:  83 year old female with arm in cast. 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 EXAM: RIGHT ELBOW - 2 VIEW COMPARISON:  Postoperative radiographs 11/28/2023 and earlier. FINDINGS: Two views 0730 hours. Overlying cast or splint. Fragmentation of the proximal ulna, olecranon on appears mildly improved, condensed since 11/28/2023. No new or progressive periosteal reaction identified. Stable regional bone mineralization. No new fracture or dislocation identified. No radiopaque foreign body identified. IMPRESSION: Abnormal but mildly improved radiographic appearance of the proximal ulna since 11/28/2023. No new osseous abnormality identified. Electronically Signed   By: VEAR Hurst M.D.   On: 12/14/2023 09:26   DG Pelvis Portable Result Date: 12/03/2023 CLINICAL DATA:  Status post revision of right total hip replacement. EXAM: PORTABLE PELVIS 1-2 VIEWS COMPARISON:  November 30, 2023. FINDINGS: Status post bilateral total hip arthroplasties. No fracture or dislocation is noted. Expected postoperative changes seen around the right hip. IMPRESSION: Postsurgical changes as noted above. Electronically Signed   By: Lynwood Landy Raddle M.D.   On: 12/03/2023 19:37   DG HIP UNILAT WITH PELVIS 1V RIGHT Result Date: 12/03/2023 CLINICAL DATA:  Right total hip arthroplasty. EXAM: DG HIP (WITH OR WITHOUT PELVIS) 1V RIGHT; DG C-ARM 1-60 MIN-NO REPORT Radiation exposure index: 0.6844 mGy. COMPARISON:  November 30, 2023. FINDINGS: Two intraoperative fluoroscopic images were obtained of the right hip. Right acetabular and femoral components are well situated. IMPRESSION: Status post right total hip arthroplasty.  No dislocation is noted. Electronically Signed   By: Lynwood Landy Raddle M.D.   On: 12/03/2023 18:12   DG C-Arm 1-60 Min-No Report Result Date: 12/03/2023 Fluoroscopy was utilized by the requesting physician.  No radiographic interpretation.   DG C-Arm 1-60 Min-No Report Result Date: 12/03/2023 Fluoroscopy was utilized by the requesting physician.  No radiographic interpretation.   DG HIP UNILAT WITH PELVIS 1V RIGHT Result Date: 11/30/2023 CLINICAL DATA:  Elective surgery.  Closed reduction of right hip. EXAM: DG HIP (WITH OR WITHOUT PELVIS) 1V RIGHT COMPARISON:  Pelvis radiograph earlier today FINDINGS: Persistent superior dislocation of the femoral component of right hip arthroplasty. The femoral component may be slightly more laterally displaced than on prior exam. Overlying skin staples remain in place. Barium previously in the small bowel has progressed to the colon. 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 The femoral component may be slightly more laterally displaced than on prior exam. Electronically Signed   By: Andrea Gasman M.D.   On: 11/30/2023 19:54   DG Pelvis Portable Result Date: 11/30/2023 CLINICAL DATA:  [id number] Hip instability, right [id number] EXAM: PORTABLE PELVIS 1-2 VIEWS COMPARISON:  11/16/2023 FINDINGS: Please note the distal femoral stem is not included in the field of view. The femoral component of right hip arthroplasty remains superiorly dislocated with respect to the acetabular cup. There may be more superior migration than on prior exam. No definite fracture is seen. Overlying skin staples remain in place. Portions of left hip arthroplasty are visualized. IMPRESSION: Persistent superior dislocation of the femoral component of right  hip arthroplasty. Electronically Signed   By: Andrea Gasman M.D.   On: 11/30/2023 11:52   DG Swallowing Func-Speech Pathology Result Date: 11/30/2023 Table formatting from the original result was not included. Modified Barium Swallow Study Patient Details  Name: Sherri [name] MRN: [id number] Date of Birth: Aug 15, 1940 Today's Date: 11/30/2023 HPI/PMH: HPI: Pt is an 83 y.o. female who presented to the ED 7/4 due to concerns for exposed surgical hardware from previous elbow surgery in June. SLP consulted due to RN's observance of coughing with liquids. PMH: hypertension, rheumatoid arthritis, chronic pain, urinary retention. Clinical Impression: Clinical Impression: Pt demonstrates a moderate to severe oropharyngeal dysphagia with a DIGEST score of 4 due to chronic and gross aspiration of thin liquids and poor efficiency of swallowing with greater than 25% of bolus residue remaining in valleculae with solids. Primary problem is decreased epiglottic deflection and decreased PES opening, both appearing due to structural changes of pharynx after ACDF. Pt has good laryngeal elevation and closure with initial swallow, but residue in valleculae and above PES is penetrated post swallow and ultimately aspirated with insufficient sensation. Attempted head turns, chin tuck, supraglottic swallow with thin, none of which prevented aspiration. A chin tuck increased epiglottic deflection, but worsened PES opening and severity of aspiration. Head turn did not improve efficiency with solids. AP view showed symmetrical residue. Pt did show potential to improve epiglottic deflection, base of tongue retraction and ultimately bolus propulsion with effort and strengthing exercises to this area may be of benefit. Pt was able to better manage mildly thick/nectar liquids with improved airway proection and time for multiple swallows. Will need to discuss pts wishes. This dysphagia is chronic, but pt is deconditioned at this time (recent hip fx and arm surgery) and has lost weight possibly due to dysphagia. Will advise nectar thick liquids, IDDSI 5 solids, multiple swallow and coughing, RMST, water  protocol and BOT and pharyngeal stregthening in next session. Factors that may increase risk of  adverse event in presence of aspiration Noe & Lianne 2021): No data recorded Recommendations/Plan: Swallowing Evaluation Recommendations Swallowing Evaluation Recommendations Recommendations: PO diet; Free water  protocol after oral care PO Diet Recommendation: Dysphagia 2 (Finely chopped); Mildly thick liquids (Level 2, nectar thick) Liquid Administration via: Cup; Straw Medication Administration: Whole meds with liquid Supervision: Patient able to self-feed Swallowing strategies  : Slow rate; Small bites/sips; effortful swallow; Multiple dry swallows after each bite/sip; Follow solids with liquids; Clear throat intermittently Postural changes: Position pt fully upright for meals Oral care recommendations: Oral care before ice chips/water ; Oral care before PO Treatment Plan Treatment Plan Treatment recommendations: Therapy as outlined in treatment plan below Follow-up recommendations: Acute inpatient rehab (3 hours/day) Functional status assessment: Patient has had a recent decline in their functional status and demonstrates the ability to make significant improvements in function in a reasonable and predictable amount of time. Treatment frequency: Min 2x/week Treatment duration: 2 weeks Interventions: Aspiration precaution training; Oropharyngeal exercises; Compensatory techniques; Patient/family education; Diet toleration management by SLP; Respiratory muscle strength training Recommendations Recommendations for follow up therapy are one component of a multi-disciplinary discharge planning process, led by the attending physician.  Recommendations may be updated based on patient status, additional functional criteria and insurance authorization. Assessment: Orofacial Exam: Orofacial Exam Oral Cavity: Oral Hygiene: WFL Oral Cavity - Dentition: Adequate natural dentition Anatomy: Anatomy: Presence of cervical hardware Boluses Administered: Boluses Administered Boluses Administered: Thin liquids (Level 0); Mildly  thick liquids (Level 2, nectar thick); Puree; Solid  Oral Impairment Domain:  Oral Impairment Domain Lip Closure: No labial escape Tongue control during bolus hold: Cohesive bolus between tongue to palatal seal Bolus preparation/mastication: Timely and efficient chewing and mashing Bolus transport/lingual motion: Brisk tongue motion Oral residue: Trace residue lining oral structures Location of oral residue : Tongue Initiation of pharyngeal swallow : Posterior angle of the ramus  Pharyngeal Impairment Domain: Pharyngeal Impairment Domain Soft palate elevation: No bolus between soft palate (SP)/pharyngeal wall (PW) Laryngeal elevation: Complete superior movement of thyroid  cartilage with complete approximation of arytenoids to epiglottic petiole Anterior hyoid excursion: Complete anterior movement Epiglottic movement: Partial inversion Laryngeal vestibule closure: Complete, no air/contrast in laryngeal vestibule Pharyngeal stripping wave : Present - diminished Pharyngeal contraction (A/P view only): Complete Pharyngoesophageal segment opening: Partial distention/partial duration, partial obstruction of flow Tongue base retraction: Wide column of contrast or air between tongue base and PPW Pharyngeal residue: Majority of contrast within or on pharyngeal structures Location of pharyngeal residue: Valleculae; Pyriform sinuses; Tongue base  Esophageal Impairment Domain: No data recorded Pill: No data recorded Penetration/Aspiration Scale Score: Penetration/Aspiration Scale Score 1.  Material does not enter airway: Puree; Solid 2.  Material enters airway, remains ABOVE vocal cords then ejected out: Thin liquids (Level 0); Mildly thick liquids (Level 2, nectar thick) 4.  Material enters airway, CONTACTS cords then ejected out: Mildly thick liquids (Level 2, nectar thick) 5.  Material enters airway, CONTACTS cords and not ejected out: Thin liquids (Level 0) 7.  Material enters airway, passes BELOW cords and not ejected out  despite cough attempt by patient: Thin liquids (Level 0) 8.  Material enters airway, passes BELOW cords without attempt by patient to eject out (silent aspiration) : Thin liquids (Level 0) Compensatory Strategies: Compensatory Strategies Compensatory strategies: Yes Straw: Ineffective Effortful swallow: Effective Effective Effortful Swallow: Puree; Mildly thick liquid (Level 2, nectar thick) Multiple swallows: Effective Effective Multiple Swallows: Mildly thick liquid (Level 2, nectar thick); Puree Chin tuck: Ineffective Ineffective Chin Tuck: Thin liquid (Level 0) Liquid wash: Effective Effective Liquid Wash: Mildly thick liquid (Level 2, nectar thick); Puree Left head turn: Ineffective Ineffective Left Head Turn: Mildly thick liquid (Level 2, nectar thick); Puree Right head turn: Ineffective Ineffective Right Head Turn: Mildly thick liquid (Level 2, nectar thick); Puree Supraglottic swallow: Ineffective Ineffective Supraglottic Swallow: Thin liquid (Level 0) Super supraglottic swallow: Ineffective Ineffective Super Supraglottic Swallow: Thin liquid (Level 0) Mendelsohn : Ineffective Ineffective Mendelsohn: Thin liquid (Level 0)   General Information: Caregiver present: No  Diet Prior to this Study: Regular; Thin liquids (Level 0)   Temperature : Normal   Respiratory Status: WFL   Supplemental O2: None (Room air)   History of Recent Intubation: No  Behavior/Cognition: Alert; Cooperative; Pleasant mood Self-Feeding Abilities: Able to self-feed Baseline vocal quality/speech: Dysphonic Volitional Cough: Able to elicit Volitional Swallow: Able to elicit No data recorded Goal Planning: Prognosis for improved oropharyngeal function: Good No data recorded No data recorded Patient/Family Stated Goal: none stated No data recorded Pain: Pain Assessment Pain Assessment: No/denies pain End of Session: Start Time:SLP Start Time (ACUTE ONLY): 0834 Stop Time: SLP Stop Time (ACUTE ONLY): 0856 Time Calculation:SLP Time Calculation  (min) (ACUTE ONLY): 22 min Charges: SLP Evaluations $ SLP Speech Visit: 1 Visit SLP Evaluations $BSS Swallow: 1 Procedure $MBS Swallow: 1 Procedure $Swallowing Treatment: 1 Procedure SLP visit diagnosis: SLP Visit Diagnosis: Dysphagia, unspecified (R13.10) Past Medical History: Past Medical History: Diagnosis Date  Anxiety   takes Valium  daily as needed  Basal cell carcinoma 01/21/1988  left nostril (MOHS),  sup-right calf (CX35FU)  Basal cell carcinoma 03/22/1996  right calf (CX35FU)  Basal cell carcinoma 03/31/1995  left wing nose  Bruises easily   d/t meds  Cancer (HCC)   basal cell ca, in situ- uterine   Cataracts, bilateral   removed bilateral  Chronic back pain   Dizziness   r/t to meds  GERD (gastroesophageal reflux disease)   no meds on a regular basis but will take Tums if needed  Headache(784.0)   r/t neck issues  History of bronchitis 6-44yrs ago  History of colon polyps   benign  HOH (hard of hearing)   wears hearing aids  Hyperlipidemia   takes Atorvastatin  on Mondays and Fridays  Hypertension   Joint pain   Joint swelling   Multiple sclerosis (HCC)   Neuromuscular disorder (HCC)   Dr. FABIENE Crete- [facility], follows [facility]  Nocturia   PONV (postoperative nausea and vomiting)   trouble urinating after surgery in 2014  Postoperative nausea and vomiting 01/11/2019  Rheumatoid arthritis (HCC)   Dr Lorrayne weekly, RA- hands- knees- feet   Rheumatoid arthritis(714.0)   Dr Dolphus takes Xeljanz  daily  Right wrist fracture   Skin cancer   Squamous cell carcinoma of skin 11/02/2001  in situ-right knee (cx27fu)  Squamous cell carcinoma of skin 11/12/2011  in situ-left forearm (CX35FU), in situ-left foot (CX35FU)  Squamous cell carcinoma of skin 01/22/2012  in situ-left lower forearm (txpbx)  Squamous cell carcinoma of skin 11/17/2012  right shin (txpbx)  Squamous cell carcinoma of skin 10/28/2013  in situ-right shoulder (CX35FU)  Squamous cell carcinoma of skin 04/28/2014  well diff-right shin  (txpbx)  Squamous cell carcinoma of skin 11/02/2014  in situ-Left shin (txpbx)  Squamous cell carcinoma of skin 12/15/2014  in situ-Left hand (txpbx), bowens-left side chest (txpbx)  Squamous cell carcinoma of skin 05/09/2015  in situ-left hand (txpbx)   Squamous cell carcinoma of skin 05/07/2016  in situ-left inner shin,ant, in situ-left inner shin, post, in situ-left outer forearm, in situ-right knuckle  Squamous cell carcinoma of skin 03/1302018  in situ-left shoulder (txpbx), in situ-right inner shin (txpbx), in situ-top of left foot (txpbx), KA- left forearm (txpbx)  Squamous cell carcinoma of skin 12/02/2016  in situ-left inner shin (txpbx)  Squamous cell carcinoma of skin 03/04/2017  in situ-left outer sup, shin (txpbx), in situ- right 2nd knuckle finger (txpbx), in situ- Left wrist (txpbx)  Squamous cell carcinoma of skin 04/06/2018  in situ-above left knee inner (txpbx)  Squamous cell carcinoma of skin 04/21/2018  in situ-right top hand (txpbx)  Squamous cell carcinoma of skin 07/08/2018  in situ-right lower inner shin (txpbx)  Squamous cell carcinoma of skin 04/27/2019  in situ- Left neck(CX35FU), In situ- right neck (CX35FU)  Urinary retention   sees Dr.Wrenn about 2 times a yr Past Surgical History: Past Surgical History: Procedure Laterality Date  ABDOMINAL HYSTERECTOMY    1985  ANTERIOR CERVICAL DECOMP/DISCECTOMY FUSION N/A 04/23/2022  Procedure: Anterior Cervical Decompression/Discectomy Fusion - Cervical Seven-Thoracic One;  Surgeon: Louis Shove, MD;  Location: [facility];  Service: Neurosurgery;  Laterality: N/A;  3C  APPENDECTOMY    with TAH  BACK SURGERY    several  BREAST BIOPSY Right   Results were negative  BROW LIFT Bilateral 10/02/2016  Procedure: BILATERAL LOWER LID BLEPHAROPLASTY;  Surgeon: Laurie Loyd Redhead, MD;  Location: [facility];  Service: Plastics;  Laterality: Bilateral;  CARPAL TUNNEL RELEASE Right   cataracts    CERVICAL FUSION    Dr Leeann  CERVICAL FUSION  12/31/2012  Dr Leeann  CERVICAL  FUSION  04/2022  CHEST TUBE INSERTION    for traumatic Pneumothorax  COLONOSCOPY  07/16/2010  normal   COLONOSCOPY W/ POLYPECTOMY  1997  negative since; Dr Debrah  ESOPHAGOGASTRODUODENOSCOPY  07/16/2010  normal  eye lid raise    EYE SURGERY Bilateral   cataracts removed - /w IOL  HARDWARE REMOVAL  08/2021  HIP CLOSED REDUCTION Right 11/16/2023  Procedure: CLOSED MANIPULATION, JOINT, HIP;  Surgeon: Ernie Cough, MD;  Location: [facility];  Service: Orthopedics;  Laterality: Right;  INCISION AND DRAINAGE OF WOUND Right 11/28/2023  Procedure: IRRIGATION AND DEBRIDEMENT WOUND; REMOVAL OF HARDWARE;  Surgeon: Edna Toribio LABOR, MD;  Location: [facility];  Service: Orthopedics;  Laterality: Right;  I & D RIGHT ELBOW  JOINT REPLACEMENT    LUMBAR FUSION    Dr Leeann  NASAL SINUS SURGERY    ORIF ELBOW FRACTURE Right 11/20/2023  Procedure: OPEN REDUCTION INTERNAL FIXATION (ORIF) ELBOW/OLECRANON FRACTURE;  Surgeon: [name] Franky SQUIBB, MD;  Location: [facility];  Service: Orthopedics;  Laterality: Right;  POSTERIOR CERVICAL FUSION/FORAMINOTOMY N/A 12/31/2012  Procedure: POSTERIOR LATERAL CERVICAL FUSION/FORAMINOTOMY LEVEL 1 CERVICAL THREE-FOUR WITH LATERAL MASS SCREWS;  Surgeon: Catalina CHRISTELLA Leeann, MD;  [facility]: [facility];  Service: Neurosurgery;  Laterality: N/A;  PTOSIS REPAIR Bilateral 10/02/2016  Procedure: INTERNAL PTOSIS REPAIR;  Surgeon: Laurie Loyd Redhead, MD;  Location: [facility];  Service: Plastics;  Laterality: Bilateral;  SKIN BIOPSY    THYROID  SURGERY    R lobe removed- 1972, has grown back - CT last done- 2018  TOTAL HIP ARTHROPLASTY  12/22/2011  Procedure: TOTAL HIP ARTHROPLASTY;  Surgeon: Dempsey JINNY Sensor, MD;  Location: [facility];  Service: Orthopedics;  Laterality: Left;  TOTAL HIP ARTHROPLASTY Right 11/12/2023  Procedure: ARTHROPLASTY, HIP, TOTAL, ANTERIOR APPROACH;  Surgeon: Fidel Rogue, MD;  Location: [facility];  Service: Orthopedics;  Laterality: Right;  TOTAL SHOULDER ARTHROPLASTY    TUBAL LIGATION    UPPER GASTROINTESTINAL  ENDOSCOPY  2012  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  WRIST SURGERY Left 09/16/2022  wrist/arm Supervised and reviewed by Consuelo Fort MA CCC-SLP DeBlois, Consuelo Fitch 11/30/2023, 10:34 AM   Recent Labs: Lab Results  Component Value Date   WBC 4.1 12/21/2023   HGB 9.1 (L) 12/21/2023   PLT 162 12/21/2023   NA 131 (L) 12/23/2023   K 3.3 (L) 12/23/2023   CL 98 12/23/2023   CO2 25 12/23/2023   GLUCOSE 83 12/23/2023   BUN 5 (L) 12/23/2023   CREATININE 0.52 12/23/2023   BILITOT 1.0 12/09/2023   ALKPHOS 67 12/09/2023   AST 18 12/09/2023   ALT 10 12/09/2023   PROT 4.6 (L) 12/09/2023   ALBUMIN  1.9 (L) 12/22/2023   CALCIUM  7.7 (L) 12/23/2023   GFRAA 65 09/26/2020   QFTBGOLDPLUS NEGATIVE 11/03/2022    Speciality Comments: Prior therapy includes: Xeljanz  (cost), Olumiant -inadequate response, Imuran weakness, leflunomide-intolerance Prolia : 05/04/18, 11/12/18, 06/09/19, 12/07/19, 07/05/20, 01/01/21, 07/09/21, 01/09/22, 07/24/22  Procedures:  No procedures performed Allergies: Darvon [propoxyphene hcl], Demerol [meperidine], Penicillins, Imuran [azathioprine], Sulfa  antibiotics, Arava [leflunomide], Lipitor [atorvastatin ], and Vytorin [ezetimibe-simvastatin]   Assessment / Plan:     Visit Diagnoses: No diagnosis found.  Orders: No orders of the defined types were placed in this encounter.  No orders of the defined types were placed in this encounter.   Face-to-face time spent with patient was *** minutes. Greater than 50% of time was spent in counseling and coordination of care.  Follow-Up Instructions: No  follow-ups on file.   Waddell CHRISTELLA Craze, PA-C  Note - This record has been created using Dragon software.  Chart creation errors have been sought, but may not always  have been located. Such creation errors do not reflect on  the standard of medical care.

## 2023-12-29 NOTE — Telephone Encounter (Signed)
 Spoke with Sharlet today

## 2023-12-30 ENCOUNTER — Ambulatory Visit: Admitting: Physician Assistant

## 2023-12-30 DIAGNOSIS — M80051D Age-related osteoporosis with current pathological fracture, right femur, subsequent encounter for fracture with routine healing: Secondary | ICD-10-CM | POA: Diagnosis not present

## 2023-12-30 DIAGNOSIS — Z8679 Personal history of other diseases of the circulatory system: Secondary | ICD-10-CM

## 2023-12-30 DIAGNOSIS — G35 Multiple sclerosis: Secondary | ICD-10-CM

## 2023-12-30 DIAGNOSIS — M67431 Ganglion, right wrist: Secondary | ICD-10-CM

## 2023-12-30 DIAGNOSIS — M79641 Pain in right hand: Secondary | ICD-10-CM

## 2023-12-30 DIAGNOSIS — R339 Retention of urine, unspecified: Secondary | ICD-10-CM | POA: Diagnosis not present

## 2023-12-30 DIAGNOSIS — M810 Age-related osteoporosis without current pathological fracture: Secondary | ICD-10-CM

## 2023-12-30 DIAGNOSIS — G4700 Insomnia, unspecified: Secondary | ICD-10-CM | POA: Diagnosis not present

## 2023-12-30 DIAGNOSIS — T84020D Dislocation of internal right hip prosthesis, subsequent encounter: Secondary | ICD-10-CM | POA: Diagnosis not present

## 2023-12-30 DIAGNOSIS — M4712 Other spondylosis with myelopathy, cervical region: Secondary | ICD-10-CM | POA: Diagnosis not present

## 2023-12-30 DIAGNOSIS — G5602 Carpal tunnel syndrome, left upper limb: Secondary | ICD-10-CM | POA: Diagnosis not present

## 2023-12-30 DIAGNOSIS — D649 Anemia, unspecified: Secondary | ICD-10-CM | POA: Diagnosis not present

## 2023-12-30 DIAGNOSIS — S62101D Fracture of unspecified carpal bone, right wrist, subsequent encounter for fracture with routine healing: Secondary | ICD-10-CM

## 2023-12-30 DIAGNOSIS — Z79899 Other long term (current) drug therapy: Secondary | ICD-10-CM

## 2023-12-30 DIAGNOSIS — M51369 Other intervertebral disc degeneration, lumbar region without mention of lumbar back pain or lower extremity pain: Secondary | ICD-10-CM

## 2023-12-30 DIAGNOSIS — G2581 Restless legs syndrome: Secondary | ICD-10-CM

## 2023-12-30 DIAGNOSIS — F5101 Primary insomnia: Secondary | ICD-10-CM

## 2023-12-30 DIAGNOSIS — M4722 Other spondylosis with radiculopathy, cervical region: Secondary | ICD-10-CM | POA: Diagnosis not present

## 2023-12-30 DIAGNOSIS — K219 Gastro-esophageal reflux disease without esophagitis: Secondary | ICD-10-CM

## 2023-12-30 DIAGNOSIS — D692 Other nonthrombocytopenic purpura: Secondary | ICD-10-CM | POA: Diagnosis not present

## 2023-12-30 DIAGNOSIS — E876 Hypokalemia: Secondary | ICD-10-CM | POA: Diagnosis not present

## 2023-12-30 DIAGNOSIS — Z8659 Personal history of other mental and behavioral disorders: Secondary | ICD-10-CM

## 2023-12-30 DIAGNOSIS — Z7982 Long term (current) use of aspirin: Secondary | ICD-10-CM | POA: Diagnosis not present

## 2023-12-30 DIAGNOSIS — M80031D Age-related osteoporosis with current pathological fracture, right forearm, subsequent encounter for fracture with routine healing: Secondary | ICD-10-CM | POA: Diagnosis not present

## 2023-12-30 DIAGNOSIS — M0689 Other specified rheumatoid arthritis, multiple sites: Secondary | ICD-10-CM | POA: Diagnosis not present

## 2023-12-30 DIAGNOSIS — E785 Hyperlipidemia, unspecified: Secondary | ICD-10-CM | POA: Diagnosis not present

## 2023-12-30 DIAGNOSIS — Z860100 Personal history of colon polyps, unspecified: Secondary | ICD-10-CM | POA: Diagnosis not present

## 2023-12-30 DIAGNOSIS — E871 Hypo-osmolality and hyponatremia: Secondary | ICD-10-CM | POA: Diagnosis not present

## 2023-12-30 DIAGNOSIS — R5383 Other fatigue: Secondary | ICD-10-CM

## 2023-12-30 DIAGNOSIS — M1611 Unilateral primary osteoarthritis, right hip: Secondary | ICD-10-CM

## 2023-12-30 DIAGNOSIS — Z85828 Personal history of other malignant neoplasm of skin: Secondary | ICD-10-CM | POA: Diagnosis not present

## 2023-12-30 DIAGNOSIS — Z87891 Personal history of nicotine dependence: Secondary | ICD-10-CM | POA: Diagnosis not present

## 2023-12-30 DIAGNOSIS — M0579 Rheumatoid arthritis with rheumatoid factor of multiple sites without organ or systems involvement: Secondary | ICD-10-CM

## 2023-12-30 DIAGNOSIS — Z7952 Long term (current) use of systemic steroids: Secondary | ICD-10-CM

## 2023-12-30 DIAGNOSIS — Z993 Dependence on wheelchair: Secondary | ICD-10-CM | POA: Diagnosis not present

## 2023-12-30 DIAGNOSIS — M4312 Spondylolisthesis, cervical region: Secondary | ICD-10-CM

## 2023-12-30 DIAGNOSIS — Z8639 Personal history of other endocrine, nutritional and metabolic disease: Secondary | ICD-10-CM

## 2023-12-30 DIAGNOSIS — I10 Essential (primary) hypertension: Secondary | ICD-10-CM | POA: Diagnosis not present

## 2023-12-30 DIAGNOSIS — Z96642 Presence of left artificial hip joint: Secondary | ICD-10-CM

## 2023-12-30 DIAGNOSIS — H9190 Unspecified hearing loss, unspecified ear: Secondary | ICD-10-CM | POA: Diagnosis not present

## 2023-12-30 DIAGNOSIS — S62102D Fracture of unspecified carpal bone, left wrist, subsequent encounter for fracture with routine healing: Secondary | ICD-10-CM

## 2023-12-30 DIAGNOSIS — E559 Vitamin D deficiency, unspecified: Secondary | ICD-10-CM

## 2023-12-30 NOTE — Assessment & Plan Note (Addendum)
 Occurred prior to 11/10/23 S/p THA 6/19 Dislocated THA 6/22 Closed reduction R THA  6/23 and 7/7 R total hip arthroplasty revision with head, ball and liner change 7/10

## 2023-12-31 ENCOUNTER — Ambulatory Visit: Admitting: Internal Medicine

## 2023-12-31 VITALS — BP 104/64 | HR 72 | Temp 98.3°F | Ht 60.0 in

## 2023-12-31 DIAGNOSIS — I10 Essential (primary) hypertension: Secondary | ICD-10-CM

## 2023-12-31 DIAGNOSIS — H9190 Unspecified hearing loss, unspecified ear: Secondary | ICD-10-CM | POA: Diagnosis not present

## 2023-12-31 DIAGNOSIS — G4700 Insomnia, unspecified: Secondary | ICD-10-CM | POA: Diagnosis not present

## 2023-12-31 DIAGNOSIS — S72001A Fracture of unspecified part of neck of right femur, initial encounter for closed fracture: Secondary | ICD-10-CM

## 2023-12-31 DIAGNOSIS — K5909 Other constipation: Secondary | ICD-10-CM | POA: Diagnosis not present

## 2023-12-31 DIAGNOSIS — E876 Hypokalemia: Secondary | ICD-10-CM | POA: Diagnosis not present

## 2023-12-31 DIAGNOSIS — R339 Retention of urine, unspecified: Secondary | ICD-10-CM | POA: Diagnosis not present

## 2023-12-31 DIAGNOSIS — Z87891 Personal history of nicotine dependence: Secondary | ICD-10-CM | POA: Diagnosis not present

## 2023-12-31 DIAGNOSIS — D638 Anemia in other chronic diseases classified elsewhere: Secondary | ICD-10-CM

## 2023-12-31 DIAGNOSIS — T84020D Dislocation of internal right hip prosthesis, subsequent encounter: Secondary | ICD-10-CM | POA: Diagnosis not present

## 2023-12-31 DIAGNOSIS — D692 Other nonthrombocytopenic purpura: Secondary | ICD-10-CM | POA: Diagnosis not present

## 2023-12-31 DIAGNOSIS — T148XXA Other injury of unspecified body region, initial encounter: Secondary | ICD-10-CM

## 2023-12-31 DIAGNOSIS — M80031D Age-related osteoporosis with current pathological fracture, right forearm, subsequent encounter for fracture with routine healing: Secondary | ICD-10-CM | POA: Diagnosis not present

## 2023-12-31 DIAGNOSIS — Z993 Dependence on wheelchair: Secondary | ICD-10-CM | POA: Diagnosis not present

## 2023-12-31 DIAGNOSIS — M0689 Other specified rheumatoid arthritis, multiple sites: Secondary | ICD-10-CM | POA: Diagnosis not present

## 2023-12-31 DIAGNOSIS — E785 Hyperlipidemia, unspecified: Secondary | ICD-10-CM | POA: Diagnosis not present

## 2023-12-31 DIAGNOSIS — R600 Localized edema: Secondary | ICD-10-CM | POA: Diagnosis not present

## 2023-12-31 DIAGNOSIS — Z85828 Personal history of other malignant neoplasm of skin: Secondary | ICD-10-CM | POA: Diagnosis not present

## 2023-12-31 DIAGNOSIS — R1310 Dysphagia, unspecified: Secondary | ICD-10-CM | POA: Diagnosis not present

## 2023-12-31 DIAGNOSIS — L03113 Cellulitis of right upper limb: Secondary | ICD-10-CM

## 2023-12-31 DIAGNOSIS — M4712 Other spondylosis with myelopathy, cervical region: Secondary | ICD-10-CM | POA: Diagnosis not present

## 2023-12-31 DIAGNOSIS — M80051D Age-related osteoporosis with current pathological fracture, right femur, subsequent encounter for fracture with routine healing: Secondary | ICD-10-CM | POA: Diagnosis not present

## 2023-12-31 DIAGNOSIS — M4722 Other spondylosis with radiculopathy, cervical region: Secondary | ICD-10-CM | POA: Diagnosis not present

## 2023-12-31 DIAGNOSIS — G35 Multiple sclerosis: Secondary | ICD-10-CM | POA: Diagnosis not present

## 2023-12-31 DIAGNOSIS — Z860100 Personal history of colon polyps, unspecified: Secondary | ICD-10-CM | POA: Diagnosis not present

## 2023-12-31 DIAGNOSIS — Z7982 Long term (current) use of aspirin: Secondary | ICD-10-CM | POA: Diagnosis not present

## 2023-12-31 DIAGNOSIS — D649 Anemia, unspecified: Secondary | ICD-10-CM | POA: Diagnosis not present

## 2023-12-31 DIAGNOSIS — G5602 Carpal tunnel syndrome, left upper limb: Secondary | ICD-10-CM | POA: Diagnosis not present

## 2023-12-31 DIAGNOSIS — K219 Gastro-esophageal reflux disease without esophagitis: Secondary | ICD-10-CM | POA: Diagnosis not present

## 2023-12-31 DIAGNOSIS — E871 Hypo-osmolality and hyponatremia: Secondary | ICD-10-CM | POA: Diagnosis not present

## 2023-12-31 NOTE — Assessment & Plan Note (Addendum)
 Chronic Controlled as long she takes her OTC medications regularly

## 2023-12-31 NOTE — Assessment & Plan Note (Addendum)
 S/p 3 units packed red blood cells in hospital Discharge hemoglobin 9.1 Iron, folate, B12 levels normal Will check CBC in 3 weeks at her next visit

## 2023-12-31 NOTE — Assessment & Plan Note (Addendum)
 Resolved Completed antibiotics In cast-sees orthopedics tomorrow for evaluation

## 2023-12-31 NOTE — Assessment & Plan Note (Deleted)
 Discharged home with Foley catheter Has follow-up with urology

## 2023-12-31 NOTE — Assessment & Plan Note (Signed)
 Chronic Has had some swallowing issues for a while In the hospital was noted to have dysphagia and was advised to have this taken liquids, but is not doing that She is very cautious with eating Does have some coughing with eating

## 2023-12-31 NOTE — Assessment & Plan Note (Addendum)
 Chronic Blood pressure on the low side I am concerned about the risk of falling Discontinue carvedilol  to 3.125 mg twice daily Monitor BP

## 2023-12-31 NOTE — Patient Instructions (Addendum)
        Medications changes include :   stop carvedilol  and monitor your BP.  For two days take 20 mg of the lasix .        Return in about 3 weeks (around 01/21/2024) for follow up.

## 2023-12-31 NOTE — Assessment & Plan Note (Addendum)
 Chronic Has 4 skin tears on her right leg - 1 larger tear and 3 smaller ones Had not healed due to recurrent trauma and probable leg swelling Increase lasix  to 20 mg x 2 days then return to 10 mg daily Elevate legs when sitting Will start PT and hopefully moving leg more will help as well Bandages changed Has home nursing to help monitor

## 2023-12-31 NOTE — Assessment & Plan Note (Signed)
 Acute on chronic Right leg very swollen and it has been for a couple of months since her initial injury Trace left leg swelling Continue Lasix -increase to 20 mg 2 days in the next few days and then resume 10 mg daily Be consistent with elevating leg Once skin tears have healed can consider compression socks

## 2024-01-01 DIAGNOSIS — S46312D Strain of muscle, fascia and tendon of triceps, left arm, subsequent encounter: Secondary | ICD-10-CM | POA: Diagnosis not present

## 2024-01-05 ENCOUNTER — Other Ambulatory Visit: Payer: Self-pay | Admitting: Internal Medicine

## 2024-01-05 ENCOUNTER — Telehealth: Payer: Self-pay

## 2024-01-05 DIAGNOSIS — M4722 Other spondylosis with radiculopathy, cervical region: Secondary | ICD-10-CM | POA: Diagnosis not present

## 2024-01-05 DIAGNOSIS — G4700 Insomnia, unspecified: Secondary | ICD-10-CM | POA: Diagnosis not present

## 2024-01-05 DIAGNOSIS — E876 Hypokalemia: Secondary | ICD-10-CM | POA: Diagnosis not present

## 2024-01-05 DIAGNOSIS — E871 Hypo-osmolality and hyponatremia: Secondary | ICD-10-CM | POA: Diagnosis not present

## 2024-01-05 DIAGNOSIS — M0689 Other specified rheumatoid arthritis, multiple sites: Secondary | ICD-10-CM | POA: Diagnosis not present

## 2024-01-05 DIAGNOSIS — R339 Retention of urine, unspecified: Secondary | ICD-10-CM | POA: Diagnosis not present

## 2024-01-05 DIAGNOSIS — Z7982 Long term (current) use of aspirin: Secondary | ICD-10-CM | POA: Diagnosis not present

## 2024-01-05 DIAGNOSIS — D649 Anemia, unspecified: Secondary | ICD-10-CM | POA: Diagnosis not present

## 2024-01-05 DIAGNOSIS — E785 Hyperlipidemia, unspecified: Secondary | ICD-10-CM | POA: Diagnosis not present

## 2024-01-05 DIAGNOSIS — M4712 Other spondylosis with myelopathy, cervical region: Secondary | ICD-10-CM | POA: Diagnosis not present

## 2024-01-05 DIAGNOSIS — T84020D Dislocation of internal right hip prosthesis, subsequent encounter: Secondary | ICD-10-CM | POA: Diagnosis not present

## 2024-01-05 DIAGNOSIS — M80051D Age-related osteoporosis with current pathological fracture, right femur, subsequent encounter for fracture with routine healing: Secondary | ICD-10-CM | POA: Diagnosis not present

## 2024-01-05 DIAGNOSIS — Z87891 Personal history of nicotine dependence: Secondary | ICD-10-CM | POA: Diagnosis not present

## 2024-01-05 DIAGNOSIS — G35 Multiple sclerosis: Secondary | ICD-10-CM | POA: Diagnosis not present

## 2024-01-05 DIAGNOSIS — H9190 Unspecified hearing loss, unspecified ear: Secondary | ICD-10-CM | POA: Diagnosis not present

## 2024-01-05 DIAGNOSIS — K219 Gastro-esophageal reflux disease without esophagitis: Secondary | ICD-10-CM | POA: Diagnosis not present

## 2024-01-05 DIAGNOSIS — G5602 Carpal tunnel syndrome, left upper limb: Secondary | ICD-10-CM | POA: Diagnosis not present

## 2024-01-05 DIAGNOSIS — M80031D Age-related osteoporosis with current pathological fracture, right forearm, subsequent encounter for fracture with routine healing: Secondary | ICD-10-CM | POA: Diagnosis not present

## 2024-01-05 DIAGNOSIS — D692 Other nonthrombocytopenic purpura: Secondary | ICD-10-CM | POA: Diagnosis not present

## 2024-01-05 DIAGNOSIS — I10 Essential (primary) hypertension: Secondary | ICD-10-CM | POA: Diagnosis not present

## 2024-01-05 DIAGNOSIS — Z860100 Personal history of colon polyps, unspecified: Secondary | ICD-10-CM | POA: Diagnosis not present

## 2024-01-05 DIAGNOSIS — Z85828 Personal history of other malignant neoplasm of skin: Secondary | ICD-10-CM | POA: Diagnosis not present

## 2024-01-05 DIAGNOSIS — Z993 Dependence on wheelchair: Secondary | ICD-10-CM | POA: Diagnosis not present

## 2024-01-05 NOTE — Telephone Encounter (Signed)
 Copied from CRM [id number]. Topic: Clinical - Medical Advice >> Jan 05, 2024 11:16 AM Aleatha BROCKS wrote: Reason for CRM: Patient came to office last thurdsday and would like clarify womb orders please give care giver [facility] at [facility] [phone]

## 2024-01-05 NOTE — Telephone Encounter (Signed)
 Copied from CRM [id number]. Topic: Clinical - Medication Refill >> Jan 05, 2024 11:14 AM Aleatha C wrote: Medication:  Nystatin  (GERHARDT'S BUTT CREAM) CREA    Has the patient contacted their pharmacy? No (Agent: If no, request that the patient contact the pharmacy for the refill. If patient does not wish to contact the pharmacy document the reason why and proceed with request.) (Agent: If yes, when and what did the pharmacy advise?)  This is the patient's preferred pharmacy:  [id number] [place], [place] - [address] [place] [address] Phone: [phone] Fax: [phone]    Is this the correct pharmacy for this prescription? Yes If no, delete pharmacy and type the correct one.   Has the prescription been filled recently? No  Is the patient out of the medication? Yes  Has the patient been seen for an appointment in the last year OR does the patient have an upcoming appointment? Yes  Can we respond through MyChart? No  Agent: Please be advised that Rx refills may take up to 3 business days. We ask that you follow-up with your pharmacy.

## 2024-01-05 NOTE — Telephone Encounter (Signed)
 Yes that is fine

## 2024-01-06 DIAGNOSIS — E871 Hypo-osmolality and hyponatremia: Secondary | ICD-10-CM | POA: Diagnosis not present

## 2024-01-06 DIAGNOSIS — R339 Retention of urine, unspecified: Secondary | ICD-10-CM | POA: Diagnosis not present

## 2024-01-06 DIAGNOSIS — I10 Essential (primary) hypertension: Secondary | ICD-10-CM | POA: Diagnosis not present

## 2024-01-06 DIAGNOSIS — E785 Hyperlipidemia, unspecified: Secondary | ICD-10-CM | POA: Diagnosis not present

## 2024-01-06 MED ORDER — GERHARDT'S BUTT CREAM: 1.0000 | TOPICAL_CREAM | Freq: Three times a day (TID) | CUTANEOUS | 0 refills | Status: AC

## 2024-01-06 NOTE — Telephone Encounter (Signed)
 Message left today for [facility] regarding bandage change.

## 2024-01-07 ENCOUNTER — Telehealth: Payer: Self-pay

## 2024-01-07 DIAGNOSIS — K219 Gastro-esophageal reflux disease without esophagitis: Secondary | ICD-10-CM | POA: Diagnosis not present

## 2024-01-07 DIAGNOSIS — Z860100 Personal history of colon polyps, unspecified: Secondary | ICD-10-CM | POA: Diagnosis not present

## 2024-01-07 DIAGNOSIS — D692 Other nonthrombocytopenic purpura: Secondary | ICD-10-CM | POA: Diagnosis not present

## 2024-01-07 DIAGNOSIS — M80031D Age-related osteoporosis with current pathological fracture, right forearm, subsequent encounter for fracture with routine healing: Secondary | ICD-10-CM | POA: Diagnosis not present

## 2024-01-07 DIAGNOSIS — Z87891 Personal history of nicotine dependence: Secondary | ICD-10-CM | POA: Diagnosis not present

## 2024-01-07 DIAGNOSIS — M0689 Other specified rheumatoid arthritis, multiple sites: Secondary | ICD-10-CM | POA: Diagnosis not present

## 2024-01-07 DIAGNOSIS — M4712 Other spondylosis with myelopathy, cervical region: Secondary | ICD-10-CM | POA: Diagnosis not present

## 2024-01-07 DIAGNOSIS — T84020D Dislocation of internal right hip prosthesis, subsequent encounter: Secondary | ICD-10-CM | POA: Diagnosis not present

## 2024-01-07 DIAGNOSIS — H9190 Unspecified hearing loss, unspecified ear: Secondary | ICD-10-CM | POA: Diagnosis not present

## 2024-01-07 DIAGNOSIS — R339 Retention of urine, unspecified: Secondary | ICD-10-CM | POA: Diagnosis not present

## 2024-01-07 DIAGNOSIS — G35 Multiple sclerosis: Secondary | ICD-10-CM | POA: Diagnosis not present

## 2024-01-07 DIAGNOSIS — E871 Hypo-osmolality and hyponatremia: Secondary | ICD-10-CM | POA: Diagnosis not present

## 2024-01-07 DIAGNOSIS — M80051D Age-related osteoporosis with current pathological fracture, right femur, subsequent encounter for fracture with routine healing: Secondary | ICD-10-CM | POA: Diagnosis not present

## 2024-01-07 DIAGNOSIS — Z85828 Personal history of other malignant neoplasm of skin: Secondary | ICD-10-CM | POA: Diagnosis not present

## 2024-01-07 DIAGNOSIS — Z7982 Long term (current) use of aspirin: Secondary | ICD-10-CM | POA: Diagnosis not present

## 2024-01-07 DIAGNOSIS — D649 Anemia, unspecified: Secondary | ICD-10-CM | POA: Diagnosis not present

## 2024-01-07 DIAGNOSIS — G5602 Carpal tunnel syndrome, left upper limb: Secondary | ICD-10-CM | POA: Diagnosis not present

## 2024-01-07 DIAGNOSIS — Z993 Dependence on wheelchair: Secondary | ICD-10-CM | POA: Diagnosis not present

## 2024-01-07 DIAGNOSIS — I10 Essential (primary) hypertension: Secondary | ICD-10-CM | POA: Diagnosis not present

## 2024-01-07 DIAGNOSIS — G4700 Insomnia, unspecified: Secondary | ICD-10-CM | POA: Diagnosis not present

## 2024-01-07 DIAGNOSIS — M4722 Other spondylosis with radiculopathy, cervical region: Secondary | ICD-10-CM | POA: Diagnosis not present

## 2024-01-07 DIAGNOSIS — E785 Hyperlipidemia, unspecified: Secondary | ICD-10-CM | POA: Diagnosis not present

## 2024-01-07 DIAGNOSIS — E876 Hypokalemia: Secondary | ICD-10-CM | POA: Diagnosis not present

## 2024-01-07 NOTE — Telephone Encounter (Signed)
 Copied from CRM #[id number]. Topic: General - Other >> Jan 07, 2024 11:38 AM [name] wrote: Reason for CRM: Pam from [facility] called stating the patient is not feeling well today and she want to see if there is anyhing the doctor wants to do CB [phone]

## 2024-01-08 DIAGNOSIS — E785 Hyperlipidemia, unspecified: Secondary | ICD-10-CM | POA: Diagnosis not present

## 2024-01-08 DIAGNOSIS — G35 Multiple sclerosis: Secondary | ICD-10-CM | POA: Diagnosis not present

## 2024-01-08 DIAGNOSIS — H9190 Unspecified hearing loss, unspecified ear: Secondary | ICD-10-CM | POA: Diagnosis not present

## 2024-01-08 DIAGNOSIS — M0689 Other specified rheumatoid arthritis, multiple sites: Secondary | ICD-10-CM | POA: Diagnosis not present

## 2024-01-08 DIAGNOSIS — D692 Other nonthrombocytopenic purpura: Secondary | ICD-10-CM | POA: Diagnosis not present

## 2024-01-08 DIAGNOSIS — T84020D Dislocation of internal right hip prosthesis, subsequent encounter: Secondary | ICD-10-CM | POA: Diagnosis not present

## 2024-01-08 DIAGNOSIS — E871 Hypo-osmolality and hyponatremia: Secondary | ICD-10-CM | POA: Diagnosis not present

## 2024-01-08 DIAGNOSIS — Z993 Dependence on wheelchair: Secondary | ICD-10-CM | POA: Diagnosis not present

## 2024-01-08 DIAGNOSIS — Z7982 Long term (current) use of aspirin: Secondary | ICD-10-CM | POA: Diagnosis not present

## 2024-01-08 DIAGNOSIS — Z87891 Personal history of nicotine dependence: Secondary | ICD-10-CM | POA: Diagnosis not present

## 2024-01-08 DIAGNOSIS — E876 Hypokalemia: Secondary | ICD-10-CM | POA: Diagnosis not present

## 2024-01-08 DIAGNOSIS — I10 Essential (primary) hypertension: Secondary | ICD-10-CM | POA: Diagnosis not present

## 2024-01-08 DIAGNOSIS — Z85828 Personal history of other malignant neoplasm of skin: Secondary | ICD-10-CM | POA: Diagnosis not present

## 2024-01-08 DIAGNOSIS — M80051D Age-related osteoporosis with current pathological fracture, right femur, subsequent encounter for fracture with routine healing: Secondary | ICD-10-CM | POA: Diagnosis not present

## 2024-01-08 DIAGNOSIS — G4700 Insomnia, unspecified: Secondary | ICD-10-CM | POA: Diagnosis not present

## 2024-01-08 DIAGNOSIS — K219 Gastro-esophageal reflux disease without esophagitis: Secondary | ICD-10-CM | POA: Diagnosis not present

## 2024-01-08 DIAGNOSIS — G5602 Carpal tunnel syndrome, left upper limb: Secondary | ICD-10-CM | POA: Diagnosis not present

## 2024-01-08 DIAGNOSIS — D649 Anemia, unspecified: Secondary | ICD-10-CM | POA: Diagnosis not present

## 2024-01-08 DIAGNOSIS — M4712 Other spondylosis with myelopathy, cervical region: Secondary | ICD-10-CM | POA: Diagnosis not present

## 2024-01-08 DIAGNOSIS — Z860100 Personal history of colon polyps, unspecified: Secondary | ICD-10-CM | POA: Diagnosis not present

## 2024-01-08 DIAGNOSIS — M80031D Age-related osteoporosis with current pathological fracture, right forearm, subsequent encounter for fracture with routine healing: Secondary | ICD-10-CM | POA: Diagnosis not present

## 2024-01-08 DIAGNOSIS — R339 Retention of urine, unspecified: Secondary | ICD-10-CM | POA: Diagnosis not present

## 2024-01-08 DIAGNOSIS — M4722 Other spondylosis with radiculopathy, cervical region: Secondary | ICD-10-CM | POA: Diagnosis not present

## 2024-01-08 NOTE — Telephone Encounter (Signed)
 Spoke with Pam today.  Contacted patient today as well and left message for her to return call to clinic to let us  know how she was feeling.  Per Pam on yesterday morning patient had cough, congestion, and slight swollen lymph nodes but by the afternoon she states patient felt ok.

## 2024-01-11 DIAGNOSIS — M4722 Other spondylosis with radiculopathy, cervical region: Secondary | ICD-10-CM | POA: Diagnosis not present

## 2024-01-11 DIAGNOSIS — G4700 Insomnia, unspecified: Secondary | ICD-10-CM | POA: Diagnosis not present

## 2024-01-11 DIAGNOSIS — Z87891 Personal history of nicotine dependence: Secondary | ICD-10-CM | POA: Diagnosis not present

## 2024-01-11 DIAGNOSIS — G35 Multiple sclerosis: Secondary | ICD-10-CM | POA: Diagnosis not present

## 2024-01-11 DIAGNOSIS — I10 Essential (primary) hypertension: Secondary | ICD-10-CM | POA: Diagnosis not present

## 2024-01-11 DIAGNOSIS — E785 Hyperlipidemia, unspecified: Secondary | ICD-10-CM | POA: Diagnosis not present

## 2024-01-11 DIAGNOSIS — R339 Retention of urine, unspecified: Secondary | ICD-10-CM | POA: Diagnosis not present

## 2024-01-11 DIAGNOSIS — E876 Hypokalemia: Secondary | ICD-10-CM | POA: Diagnosis not present

## 2024-01-11 DIAGNOSIS — Z993 Dependence on wheelchair: Secondary | ICD-10-CM | POA: Diagnosis not present

## 2024-01-11 DIAGNOSIS — E871 Hypo-osmolality and hyponatremia: Secondary | ICD-10-CM | POA: Diagnosis not present

## 2024-01-11 DIAGNOSIS — G5602 Carpal tunnel syndrome, left upper limb: Secondary | ICD-10-CM | POA: Diagnosis not present

## 2024-01-11 DIAGNOSIS — M4712 Other spondylosis with myelopathy, cervical region: Secondary | ICD-10-CM | POA: Diagnosis not present

## 2024-01-11 DIAGNOSIS — M80031D Age-related osteoporosis with current pathological fracture, right forearm, subsequent encounter for fracture with routine healing: Secondary | ICD-10-CM | POA: Diagnosis not present

## 2024-01-11 DIAGNOSIS — M80051D Age-related osteoporosis with current pathological fracture, right femur, subsequent encounter for fracture with routine healing: Secondary | ICD-10-CM | POA: Diagnosis not present

## 2024-01-11 DIAGNOSIS — K219 Gastro-esophageal reflux disease without esophagitis: Secondary | ICD-10-CM | POA: Diagnosis not present

## 2024-01-11 DIAGNOSIS — D649 Anemia, unspecified: Secondary | ICD-10-CM | POA: Diagnosis not present

## 2024-01-11 DIAGNOSIS — Z7982 Long term (current) use of aspirin: Secondary | ICD-10-CM | POA: Diagnosis not present

## 2024-01-11 DIAGNOSIS — H9190 Unspecified hearing loss, unspecified ear: Secondary | ICD-10-CM | POA: Diagnosis not present

## 2024-01-11 DIAGNOSIS — D692 Other nonthrombocytopenic purpura: Secondary | ICD-10-CM | POA: Diagnosis not present

## 2024-01-11 DIAGNOSIS — M0689 Other specified rheumatoid arthritis, multiple sites: Secondary | ICD-10-CM | POA: Diagnosis not present

## 2024-01-11 DIAGNOSIS — T84020D Dislocation of internal right hip prosthesis, subsequent encounter: Secondary | ICD-10-CM | POA: Diagnosis not present

## 2024-01-11 DIAGNOSIS — Z860100 Personal history of colon polyps, unspecified: Secondary | ICD-10-CM | POA: Diagnosis not present

## 2024-01-11 DIAGNOSIS — Z85828 Personal history of other malignant neoplasm of skin: Secondary | ICD-10-CM | POA: Diagnosis not present

## 2024-01-12 ENCOUNTER — Telehealth: Payer: Self-pay

## 2024-01-12 DIAGNOSIS — K219 Gastro-esophageal reflux disease without esophagitis: Secondary | ICD-10-CM | POA: Diagnosis not present

## 2024-01-12 DIAGNOSIS — M80051D Age-related osteoporosis with current pathological fracture, right femur, subsequent encounter for fracture with routine healing: Secondary | ICD-10-CM | POA: Diagnosis not present

## 2024-01-12 DIAGNOSIS — I10 Essential (primary) hypertension: Secondary | ICD-10-CM | POA: Diagnosis not present

## 2024-01-12 DIAGNOSIS — T84020D Dislocation of internal right hip prosthesis, subsequent encounter: Secondary | ICD-10-CM | POA: Diagnosis not present

## 2024-01-12 DIAGNOSIS — G35 Multiple sclerosis: Secondary | ICD-10-CM | POA: Diagnosis not present

## 2024-01-12 DIAGNOSIS — Z993 Dependence on wheelchair: Secondary | ICD-10-CM | POA: Diagnosis not present

## 2024-01-12 DIAGNOSIS — H9190 Unspecified hearing loss, unspecified ear: Secondary | ICD-10-CM | POA: Diagnosis not present

## 2024-01-12 DIAGNOSIS — M0689 Other specified rheumatoid arthritis, multiple sites: Secondary | ICD-10-CM | POA: Diagnosis not present

## 2024-01-12 DIAGNOSIS — M4712 Other spondylosis with myelopathy, cervical region: Secondary | ICD-10-CM | POA: Diagnosis not present

## 2024-01-12 DIAGNOSIS — D649 Anemia, unspecified: Secondary | ICD-10-CM | POA: Diagnosis not present

## 2024-01-12 DIAGNOSIS — Z85828 Personal history of other malignant neoplasm of skin: Secondary | ICD-10-CM | POA: Diagnosis not present

## 2024-01-12 DIAGNOSIS — D692 Other nonthrombocytopenic purpura: Secondary | ICD-10-CM | POA: Diagnosis not present

## 2024-01-12 DIAGNOSIS — M4722 Other spondylosis with radiculopathy, cervical region: Secondary | ICD-10-CM | POA: Diagnosis not present

## 2024-01-12 DIAGNOSIS — E871 Hypo-osmolality and hyponatremia: Secondary | ICD-10-CM | POA: Diagnosis not present

## 2024-01-12 DIAGNOSIS — Z860100 Personal history of colon polyps, unspecified: Secondary | ICD-10-CM | POA: Diagnosis not present

## 2024-01-12 DIAGNOSIS — R339 Retention of urine, unspecified: Secondary | ICD-10-CM | POA: Diagnosis not present

## 2024-01-12 DIAGNOSIS — M80031D Age-related osteoporosis with current pathological fracture, right forearm, subsequent encounter for fracture with routine healing: Secondary | ICD-10-CM | POA: Diagnosis not present

## 2024-01-12 DIAGNOSIS — Z87891 Personal history of nicotine dependence: Secondary | ICD-10-CM | POA: Diagnosis not present

## 2024-01-12 DIAGNOSIS — E876 Hypokalemia: Secondary | ICD-10-CM | POA: Diagnosis not present

## 2024-01-12 DIAGNOSIS — Z7982 Long term (current) use of aspirin: Secondary | ICD-10-CM | POA: Diagnosis not present

## 2024-01-12 DIAGNOSIS — E785 Hyperlipidemia, unspecified: Secondary | ICD-10-CM | POA: Diagnosis not present

## 2024-01-12 DIAGNOSIS — G4700 Insomnia, unspecified: Secondary | ICD-10-CM | POA: Diagnosis not present

## 2024-01-12 DIAGNOSIS — G5602 Carpal tunnel syndrome, left upper limb: Secondary | ICD-10-CM | POA: Diagnosis not present

## 2024-01-12 NOTE — Telephone Encounter (Signed)
 Copied from CRM [id number]. Topic: Clinical - Medical Advice >> Jan 12, 2024  1:24 PM Thersia BROCKS wrote: Reason for CRM: Pam from [facility] called in stated the patient asked If she can get some magic mouthwash, stated her mouth is dry and she would like that   [id number] [place], [place] - [address] [place] [address] Phone: [phone] Fax: [phone]

## 2024-01-13 MED ORDER — NYSTATIN 100000 UNIT/ML MT SUSP: 5.0000 mL | Freq: Three times a day (TID) | OROMUCOSAL | 0 refills | Status: DC | PRN

## 2024-01-13 NOTE — Telephone Encounter (Signed)
 Magic mouthwash is a compounded medication so it has to go to a pharmacy but does The so Prescription Was Sent to [facility].

## 2024-01-13 NOTE — Telephone Encounter (Signed)
 Spoke with patient's daughter today.

## 2024-01-14 ENCOUNTER — Telehealth: Payer: Self-pay

## 2024-01-14 ENCOUNTER — Telehealth: Payer: Self-pay | Admitting: Internal Medicine

## 2024-01-14 DIAGNOSIS — E871 Hypo-osmolality and hyponatremia: Secondary | ICD-10-CM | POA: Diagnosis not present

## 2024-01-14 DIAGNOSIS — T84020D Dislocation of internal right hip prosthesis, subsequent encounter: Secondary | ICD-10-CM | POA: Diagnosis not present

## 2024-01-14 DIAGNOSIS — M0689 Other specified rheumatoid arthritis, multiple sites: Secondary | ICD-10-CM | POA: Diagnosis not present

## 2024-01-14 DIAGNOSIS — G5602 Carpal tunnel syndrome, left upper limb: Secondary | ICD-10-CM | POA: Diagnosis not present

## 2024-01-14 DIAGNOSIS — K219 Gastro-esophageal reflux disease without esophagitis: Secondary | ICD-10-CM | POA: Diagnosis not present

## 2024-01-14 DIAGNOSIS — M4722 Other spondylosis with radiculopathy, cervical region: Secondary | ICD-10-CM | POA: Diagnosis not present

## 2024-01-14 DIAGNOSIS — G4700 Insomnia, unspecified: Secondary | ICD-10-CM | POA: Diagnosis not present

## 2024-01-14 DIAGNOSIS — R339 Retention of urine, unspecified: Secondary | ICD-10-CM | POA: Diagnosis not present

## 2024-01-14 DIAGNOSIS — Z87891 Personal history of nicotine dependence: Secondary | ICD-10-CM | POA: Diagnosis not present

## 2024-01-14 DIAGNOSIS — D692 Other nonthrombocytopenic purpura: Secondary | ICD-10-CM | POA: Diagnosis not present

## 2024-01-14 DIAGNOSIS — Z7982 Long term (current) use of aspirin: Secondary | ICD-10-CM | POA: Diagnosis not present

## 2024-01-14 DIAGNOSIS — M80051D Age-related osteoporosis with current pathological fracture, right femur, subsequent encounter for fracture with routine healing: Secondary | ICD-10-CM | POA: Diagnosis not present

## 2024-01-14 DIAGNOSIS — Z85828 Personal history of other malignant neoplasm of skin: Secondary | ICD-10-CM | POA: Diagnosis not present

## 2024-01-14 DIAGNOSIS — D649 Anemia, unspecified: Secondary | ICD-10-CM | POA: Diagnosis not present

## 2024-01-14 DIAGNOSIS — H9190 Unspecified hearing loss, unspecified ear: Secondary | ICD-10-CM | POA: Diagnosis not present

## 2024-01-14 DIAGNOSIS — E876 Hypokalemia: Secondary | ICD-10-CM | POA: Diagnosis not present

## 2024-01-14 DIAGNOSIS — Z860100 Personal history of colon polyps, unspecified: Secondary | ICD-10-CM | POA: Diagnosis not present

## 2024-01-14 DIAGNOSIS — I10 Essential (primary) hypertension: Secondary | ICD-10-CM | POA: Diagnosis not present

## 2024-01-14 DIAGNOSIS — M80031D Age-related osteoporosis with current pathological fracture, right forearm, subsequent encounter for fracture with routine healing: Secondary | ICD-10-CM | POA: Diagnosis not present

## 2024-01-14 DIAGNOSIS — M4712 Other spondylosis with myelopathy, cervical region: Secondary | ICD-10-CM | POA: Diagnosis not present

## 2024-01-14 DIAGNOSIS — G35 Multiple sclerosis: Secondary | ICD-10-CM | POA: Diagnosis not present

## 2024-01-14 DIAGNOSIS — E785 Hyperlipidemia, unspecified: Secondary | ICD-10-CM | POA: Diagnosis not present

## 2024-01-14 DIAGNOSIS — Z993 Dependence on wheelchair: Secondary | ICD-10-CM | POA: Diagnosis not present

## 2024-01-14 MED ORDER — OXYCODONE HCL 5 MG PO TABS
5.0000 mg | ORAL_TABLET | ORAL | 0 refills | Status: DC | PRN
Start: 2024-01-14 — End: 2024-01-26

## 2024-01-14 NOTE — Addendum Note (Signed)
 Addended by: [name] PARAS on: 01/14/2024 08:01 PM   Modules accepted: Orders

## 2024-01-14 NOTE — Telephone Encounter (Unsigned)
 Copied from CRM #[id number]. Topic: Clinical - Medication Refill >> Jan 14, 2024  1:40 PM Armenia J wrote: Medication:  oxyCODONE  (ROXICODONE ) 5 MG immediate release tablet Nystatin  (GERHARDT'S BUTT CREAM) CREAM  Has the patient contacted their pharmacy? Yes (Agent: If no, request that the patient contact the pharmacy for the refill. If patient does not wish to contact the pharmacy document the reason why and proceed with request.) (Agent: If yes, when and what did the pharmacy advise?) Pharmacy advised to call primary care.  This is the patient's preferred pharmacy:  [id number] [place], [place] - [address] [place] [address] Phone: [phone] Fax: [phone]  Is this the correct pharmacy for this prescription? Yes If no, delete pharmacy and type the correct one.   Has the prescription been filled recently? No  Is the patient out of the medication? Yes  Has the patient been seen for an appointment in the last year OR does the patient have an upcoming appointment? Yes  Can we respond through MyChart? No  Agent: Please be advised that Rx refills may take up to 3 business days. We ask that you follow-up with your pharmacy.

## 2024-01-14 NOTE — Telephone Encounter (Signed)
 Oxycodone  sent to pharmacy.  Gerhardt's butt cream is otc so not covered by insurance

## 2024-01-14 NOTE — Telephone Encounter (Signed)
 Copied from CRM #[id number]. Topic: General - Other >> Jan 14, 2024  1:47 PM Armenia J wrote: Reason for CRM: Patient's hip is presenting issues since the hip replacement. Since her skin is so thin, the patient is having trouble keeping the wound together. The patient's daughter has reached out to Dr Fidel but she also felt it was necessary to keep Dr Geofm updated.

## 2024-01-14 NOTE — Telephone Encounter (Signed)
 Copied from CRM #[id number]. Topic: Clinical - Medication Refill >> Jan 14, 2024  1:59 PM Deaijah H wrote: Medication: tamsulosin  (FLOMAX ) 0.4 MG CAPS capsule  Has the patient contacted their pharmacy? Yes (Agent: If no, request that the patient contact the pharmacy for the refill. If patient does not wish to contact the pharmacy document the reason why and proceed with request.) (Agent: If yes, when and what did the pharmacy advise?) To contact Dr.  This is the patient's preferred pharmacy:  [id number] [place], [place] - [address] [place] [address] Phone: [phone] Fax: [phone]   Is this the correct pharmacy for this prescription? Yes If no, delete pharmacy and type the correct one.   Has the prescription been filled recently? Yes  Is the patient out of the medication? No, 2 left   Has the patient been seen for an appointment in the last year OR does the patient have an upcoming appointment? Yes  Can we respond through MyChart? Yes  Agent: Please be advised that Rx refills may take up to 3 business days. We ask that you follow-up with your pharmacy.

## 2024-01-15 MED ORDER — TAMSULOSIN HCL 0.4 MG PO CAPS: 0.4000 mg | ORAL_CAPSULE | Freq: Every day | ORAL | 0 refills | Status: DC

## 2024-01-15 NOTE — Telephone Encounter (Signed)
 This has not been filled by you. Okay to fill?

## 2024-01-15 NOTE — Telephone Encounter (Signed)
 Prescription sent to pharmacy.  Can you please reach out to her and see if urology wanted her to continue this long-term.

## 2024-01-18 ENCOUNTER — Emergency Department (HOSPITAL_COMMUNITY)

## 2024-01-18 ENCOUNTER — Inpatient Hospital Stay (HOSPITAL_COMMUNITY)
Admission: EM | Admit: 2024-01-18 | Discharge: 2024-01-26 | DRG: 467 | Disposition: A | Discharge status: Home Health | Attending: Internal Medicine | Admitting: Internal Medicine

## 2024-01-18 ENCOUNTER — Other Ambulatory Visit: Payer: Self-pay | Admitting: Internal Medicine

## 2024-01-18 ENCOUNTER — Encounter (HOSPITAL_COMMUNITY): Payer: Self-pay

## 2024-01-18 ENCOUNTER — Other Ambulatory Visit: Payer: Self-pay

## 2024-01-18 ENCOUNTER — Telehealth: Payer: Self-pay

## 2024-01-18 DIAGNOSIS — E871 Hypo-osmolality and hyponatremia: Secondary | ICD-10-CM | POA: Diagnosis present

## 2024-01-18 DIAGNOSIS — G8929 Other chronic pain: Secondary | ICD-10-CM | POA: Diagnosis not present

## 2024-01-18 DIAGNOSIS — Z882 Allergy status to sulfonamides status: Secondary | ICD-10-CM | POA: Diagnosis not present

## 2024-01-18 DIAGNOSIS — Z809 Family history of malignant neoplasm, unspecified: Secondary | ICD-10-CM

## 2024-01-18 DIAGNOSIS — I10 Essential (primary) hypertension: Secondary | ICD-10-CM | POA: Diagnosis not present

## 2024-01-18 DIAGNOSIS — Z90710 Acquired absence of both cervix and uterus: Secondary | ICD-10-CM

## 2024-01-18 DIAGNOSIS — Z96649 Presence of unspecified artificial hip joint: Secondary | ICD-10-CM

## 2024-01-18 DIAGNOSIS — M25551 Pain in right hip: Secondary | ICD-10-CM | POA: Diagnosis not present

## 2024-01-18 DIAGNOSIS — T84020D Dislocation of internal right hip prosthesis, subsequent encounter: Secondary | ICD-10-CM | POA: Diagnosis not present

## 2024-01-18 DIAGNOSIS — Z888 Allergy status to other drugs, medicaments and biological substances status: Secondary | ICD-10-CM

## 2024-01-18 DIAGNOSIS — Z8249 Family history of ischemic heart disease and other diseases of the circulatory system: Secondary | ICD-10-CM

## 2024-01-18 DIAGNOSIS — S73004A Unspecified dislocation of right hip, initial encounter: Secondary | ICD-10-CM | POA: Diagnosis present

## 2024-01-18 DIAGNOSIS — Z96642 Presence of left artificial hip joint: Secondary | ICD-10-CM | POA: Diagnosis not present

## 2024-01-18 DIAGNOSIS — Z79899 Other long term (current) drug therapy: Secondary | ICD-10-CM

## 2024-01-18 DIAGNOSIS — A498 Other bacterial infections of unspecified site: Secondary | ICD-10-CM | POA: Diagnosis present

## 2024-01-18 DIAGNOSIS — H9190 Unspecified hearing loss, unspecified ear: Secondary | ICD-10-CM | POA: Diagnosis not present

## 2024-01-18 DIAGNOSIS — Z87891 Personal history of nicotine dependence: Secondary | ICD-10-CM | POA: Diagnosis not present

## 2024-01-18 DIAGNOSIS — Z880 Allergy status to penicillin: Secondary | ICD-10-CM | POA: Diagnosis not present

## 2024-01-18 DIAGNOSIS — B965 Pseudomonas (aeruginosa) (mallei) (pseudomallei) as the cause of diseases classified elsewhere: Secondary | ICD-10-CM | POA: Diagnosis not present

## 2024-01-18 DIAGNOSIS — D649 Anemia, unspecified: Secondary | ICD-10-CM | POA: Diagnosis not present

## 2024-01-18 DIAGNOSIS — M25572 Pain in left ankle and joints of left foot: Secondary | ICD-10-CM | POA: Diagnosis not present

## 2024-01-18 DIAGNOSIS — Y831 Surgical operation with implant of artificial internal device as the cause of abnormal reaction of the patient, or of later complication, without mention of misadventure at the time of the procedure: Secondary | ICD-10-CM | POA: Diagnosis present

## 2024-01-18 DIAGNOSIS — Z85828 Personal history of other malignant neoplasm of skin: Secondary | ICD-10-CM | POA: Diagnosis not present

## 2024-01-18 DIAGNOSIS — Z886 Allergy status to analgesic agent status: Secondary | ICD-10-CM

## 2024-01-18 DIAGNOSIS — R627 Adult failure to thrive: Secondary | ICD-10-CM | POA: Diagnosis not present

## 2024-01-18 DIAGNOSIS — T8451XA Infection and inflammatory reaction due to internal right hip prosthesis, initial encounter: Secondary | ICD-10-CM | POA: Diagnosis present

## 2024-01-18 DIAGNOSIS — Z860100 Personal history of colon polyps, unspecified: Secondary | ICD-10-CM | POA: Diagnosis not present

## 2024-01-18 DIAGNOSIS — Z833 Family history of diabetes mellitus: Secondary | ICD-10-CM | POA: Diagnosis not present

## 2024-01-18 DIAGNOSIS — Z885 Allergy status to narcotic agent status: Secondary | ICD-10-CM | POA: Diagnosis not present

## 2024-01-18 DIAGNOSIS — G5602 Carpal tunnel syndrome, left upper limb: Secondary | ICD-10-CM | POA: Diagnosis not present

## 2024-01-18 DIAGNOSIS — D62 Acute posthemorrhagic anemia: Secondary | ICD-10-CM | POA: Diagnosis not present

## 2024-01-18 DIAGNOSIS — K219 Gastro-esophageal reflux disease without esophagitis: Secondary | ICD-10-CM | POA: Diagnosis present

## 2024-01-18 DIAGNOSIS — T84020A Dislocation of internal right hip prosthesis, initial encounter: Secondary | ICD-10-CM | POA: Diagnosis not present

## 2024-01-18 DIAGNOSIS — E785 Hyperlipidemia, unspecified: Secondary | ICD-10-CM | POA: Diagnosis not present

## 2024-01-18 DIAGNOSIS — Z8507 Personal history of malignant neoplasm of pancreas: Secondary | ICD-10-CM

## 2024-01-18 DIAGNOSIS — D638 Anemia in other chronic diseases classified elsewhere: Secondary | ICD-10-CM | POA: Diagnosis not present

## 2024-01-18 DIAGNOSIS — E876 Hypokalemia: Secondary | ICD-10-CM | POA: Diagnosis present

## 2024-01-18 DIAGNOSIS — R1310 Dysphagia, unspecified: Secondary | ICD-10-CM | POA: Diagnosis present

## 2024-01-18 DIAGNOSIS — Z7952 Long term (current) use of systemic steroids: Secondary | ICD-10-CM

## 2024-01-18 DIAGNOSIS — R339 Retention of urine, unspecified: Secondary | ICD-10-CM | POA: Diagnosis not present

## 2024-01-18 DIAGNOSIS — Z7982 Long term (current) use of aspirin: Secondary | ICD-10-CM | POA: Diagnosis not present

## 2024-01-18 DIAGNOSIS — M0689 Other specified rheumatoid arthritis, multiple sites: Secondary | ICD-10-CM | POA: Diagnosis not present

## 2024-01-18 DIAGNOSIS — Z993 Dependence on wheelchair: Secondary | ICD-10-CM | POA: Diagnosis not present

## 2024-01-18 DIAGNOSIS — Y792 Prosthetic and other implants, materials and accessory orthopedic devices associated with adverse incidents: Secondary | ICD-10-CM | POA: Diagnosis present

## 2024-01-18 DIAGNOSIS — M0579 Rheumatoid arthritis with rheumatoid factor of multiple sites without organ or systems involvement: Secondary | ICD-10-CM | POA: Diagnosis not present

## 2024-01-18 DIAGNOSIS — M80031D Age-related osteoporosis with current pathological fracture, right forearm, subsequent encounter for fracture with routine healing: Secondary | ICD-10-CM | POA: Diagnosis not present

## 2024-01-18 DIAGNOSIS — M4712 Other spondylosis with myelopathy, cervical region: Secondary | ICD-10-CM | POA: Diagnosis not present

## 2024-01-18 DIAGNOSIS — M069 Rheumatoid arthritis, unspecified: Secondary | ICD-10-CM | POA: Diagnosis present

## 2024-01-18 DIAGNOSIS — G35 Multiple sclerosis: Secondary | ICD-10-CM | POA: Diagnosis present

## 2024-01-18 DIAGNOSIS — M810 Age-related osteoporosis without current pathological fracture: Secondary | ICD-10-CM | POA: Diagnosis present

## 2024-01-18 DIAGNOSIS — F419 Anxiety disorder, unspecified: Secondary | ICD-10-CM | POA: Diagnosis present

## 2024-01-18 DIAGNOSIS — Z79631 Long term (current) use of antimetabolite agent: Secondary | ICD-10-CM | POA: Diagnosis not present

## 2024-01-18 DIAGNOSIS — G4700 Insomnia, unspecified: Secondary | ICD-10-CM | POA: Diagnosis not present

## 2024-01-18 DIAGNOSIS — S73004D Unspecified dislocation of right hip, subsequent encounter: Principal | ICD-10-CM

## 2024-01-18 DIAGNOSIS — Z7969 Long term (current) use of other immunomodulators and immunosuppressants: Secondary | ICD-10-CM

## 2024-01-18 DIAGNOSIS — Z96619 Presence of unspecified artificial shoulder joint: Secondary | ICD-10-CM | POA: Diagnosis present

## 2024-01-18 DIAGNOSIS — M80051D Age-related osteoporosis with current pathological fracture, right femur, subsequent encounter for fracture with routine healing: Secondary | ICD-10-CM | POA: Diagnosis not present

## 2024-01-18 DIAGNOSIS — E7849 Other hyperlipidemia: Secondary | ICD-10-CM | POA: Diagnosis not present

## 2024-01-18 DIAGNOSIS — Z820 Family history of epilepsy and other diseases of the nervous system: Secondary | ICD-10-CM

## 2024-01-18 DIAGNOSIS — Z743 Need for continuous supervision: Secondary | ICD-10-CM | POA: Diagnosis not present

## 2024-01-18 DIAGNOSIS — Z800 Family history of malignant neoplasm of digestive organs: Secondary | ICD-10-CM

## 2024-01-18 DIAGNOSIS — D692 Other nonthrombocytopenic purpura: Secondary | ICD-10-CM | POA: Diagnosis not present

## 2024-01-18 DIAGNOSIS — M4722 Other spondylosis with radiculopathy, cervical region: Secondary | ICD-10-CM | POA: Diagnosis not present

## 2024-01-18 LAB — CBC
HCT: 28.3 % — ABNORMAL LOW (ref 36.0–46.0)
Hemoglobin: 9 g/dL — ABNORMAL LOW (ref 12.0–15.0)
MCH: 31 pg (ref 26.0–34.0)
MCHC: 31.8 g/dL (ref 30.0–36.0)
MCV: 97.6 fL (ref 80.0–100.0)
Platelets: 370 K/uL (ref 150–400)
RBC: 2.9 MIL/uL — ABNORMAL LOW (ref 3.87–5.11)
RDW: 18.6 % — ABNORMAL HIGH (ref 11.5–15.5)
WBC: 8 K/uL (ref 4.0–10.5)
nRBC: 0 % (ref 0.0–0.2)

## 2024-01-18 LAB — BASIC METABOLIC PANEL WITH GFR
Anion gap: 8 (ref 5–15)
BUN: 6 mg/dL — ABNORMAL LOW (ref 8–23)
CO2: 26 mmol/L (ref 22–32)
Calcium: 8.1 mg/dL — ABNORMAL LOW (ref 8.9–10.3)
Chloride: 97 mmol/L — ABNORMAL LOW (ref 98–111)
Creatinine, Ser: 0.8 mg/dL (ref 0.44–1.00)
GFR, Estimated: >60 mL/min (ref >60)
Glucose, Bld: 116 mg/dL — ABNORMAL HIGH (ref 70–99)
Potassium: 3.5 mmol/L (ref 3.5–5.1)
Sodium: 131 mmol/L — ABNORMAL LOW (ref 135–145)

## 2024-01-18 MED ORDER — DULOXETINE HCL 20 MG PO CPEP: 20.0000 mg | ORAL_CAPSULE | Freq: Every day | ORAL | 2 refills | Status: DC

## 2024-01-18 NOTE — Telephone Encounter (Signed)
 On her discharge paperwork it says she should be taking 10 mg or half a pill daily.  I would have her do this and then that can be ingested as needed based on her swelling.

## 2024-01-18 NOTE — Telephone Encounter (Signed)
 Copied from CRM #[id number]. Topic: Clinical - Medication Refill >> Jan 18, 2024  9:37 AM Burnard DEL wrote: Medication:  DULoxetine  (CYMBALTA ) 20 MG capsule   Has the patient contacted their pharmacy? No (Agent: If no, request that the patient contact the pharmacy for the refill. If patient does not wish to contact the pharmacy document the reason why and proceed with request.) (Agent: If yes, when and what did the pharmacy advise?)  This is the patient's preferred pharmacy:  [id number] [place], [place] - [address] [place] [address] Phone: [phone] Fax: [phone]   Is this the correct pharmacy for this prescription? Yes If no, delete pharmacy and type the correct one.   Has the prescription been filled recently? No  Is the patient out of the medication? Yes  Has the patient been seen for an appointment in the last year OR does the patient have an upcoming appointment? Yes  Can we respond through MyChart? Yes  Agent: Please be advised that Rx refills may take up to 3 business days. We ask that you follow-up with your pharmacy.

## 2024-01-18 NOTE — ED Provider Notes (Signed)
 [name] [facility] Provider Note   CSN: [id number] Arrival date & time: 01/18/24  2122     Patient presents with: Hip Pain   Sherri [name] is a 83 y.o. 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  Patient presents to ED for evaluation of right hip pain.  Here with daughter who provides history.  Per patient daughter, patient woke up this evening around 3 PM an extreme amount of atraumatic pain.  Patient daughter reports that patient has had 2 spontaneous hip dislocations after having a hip revision 2 months ago.  States that today was with caregiver when patient began complaining of pain.  Has taken home oxycodone  without relief.  Denies trauma, falls.  Denies nausea, vomiting, fevers.  Reports follows with Dr. Fidel of [facility].  Apparently patient has follow-up tomorrow morning at 10 AM with Dr. Fidel in office.   Hip Pain       Prior to Admission medications   Medication Sig Start Date End Date Taking? Authorizing Provider  acetaminophen  (TYLENOL ) 325 MG tablet Take 2 tablets (650 mg total) by mouth every 6 (six) hours as needed for mild pain (pain score 1-3) or fever (or Fever >/= 101). 11/20/23   Dorinda Drue DASEN, MD  ascorbic acid  (VITAMIN C ) 1000 MG tablet Take 1 tablet (1,000 mg total) by mouth daily. 12/23/23   Leak, Brandi L, NP  atorvastatin  (LIPITOR) 10 MG tablet Take 1 tablet (10 mg total) by mouth every evening. 12/22/23   Leak, Brandi L, NP  Biotin  5000 MCG TABS Take 10,000 mcg by mouth in the morning.    [provider]  bisacodyl  (DULCOLAX) 10 MG suppository Place 1 suppository (10 mg total) rectally daily as needed for moderate constipation. 11/20/23   Dorinda Drue DASEN, MD  Carboxymethylcellul-Glycerin  (LUBRICATING EYE DROPS OP)  Place 1 drop into both eyes daily as needed (dry eyes).    [provider]  cholecalciferol  (VITAMIN D ) 25 MCG (1000 UNIT) tablet Take 1,000 Units by mouth in the morning.    [provider]  cyclobenzaprine  (FLEXERIL ) 10 MG tablet TAKE 1 TABLET BY MOUTH THREE TIMES A DAY AS NEEDED FOR MUSCLE SPASM 06/11/23   [name] PARAS, MD  denosumab  (PROLIA ) 60 MG/ML SOSY injection Inject 60 mg into the skin every 6 (six) months. Deliver to rheum: [address], [address], [place], [place] [address]. Appt on 07/23/2022 07/17/22 11/26/24  Cheryl Waddell HERO, PA-C  diazepam  (VALIUM ) 5 MG tablet TAKE 1 TABLET BY MOUTH AT BEDTIME AS NEEDED FOR MUSCLE SPASMS. 04/28/23   [name] PARAS, MD  docusate sodium  (COLACE) 100 MG capsule Take 1 capsule (100 mg total) by mouth daily. 11/21/23   Dorinda Drue DASEN, MD  DULoxetine  (CYMBALTA ) 20 MG capsule Take 1 capsule (20 mg total) by mouth at bedtime. 01/18/24   [name] PARAS, MD  folic acid  (FOLVITE ) 1 MG tablet Take 1 tablet (1 mg total) by mouth daily. 09/02/23   Cheryl Waddell HERO, PA-C  furosemide  (LASIX ) 20 MG tablet Take 0.5 tablets (10 mg total) by mouth daily. 12/23/23   Leak, Brandi L, NP  magic mouthwash (nystatin , lidocaine , diphenhydrAMINE , alum & mag hydroxide) suspension Swish and spit 5 mLs 3 (three) times daily as needed for mouth pain. 01/13/24   [name] PARAS, MD  methotrexate  (RHEUMATREX) 2.5  MG tablet Take 5 tablets (12.5 mg total) by mouth once a week. Caution:Chemotherapy. Protect from light. 09/02/23   Cheryl Waddell HERO, PA-C  Nystatin  (GERHARDT'S BUTT CREAM) CREA Apply 1 Application topically 3 (three) times daily. 01/06/24   [name] PARAS, MD  oxyCODONE  (ROXICODONE ) 5 MG immediate release tablet Take 1 tablet (5 mg total) by mouth every 4 (four) hours as needed for severe pain (pain score 7-10). 01/14/24   [name] PARAS, MD  pantoprazole  (PROTONIX ) 40 MG tablet Take 1 tablet (40 mg total) by mouth 2 (two) times daily before a meal. 07/27/23   Burns, Glade PARAS, MD   polyethylene glycol powder (GLYCOLAX /MIRALAX ) 17 GM/SCOOP powder Take 17 g by mouth 2 (two) times daily. 12/22/23   Leak, Brandi L, NP  potassium chloride  SA (KLOR-CON  M) 20 MEQ tablet Take 0.5 tablets (10 mEq total) by mouth every other day. 12/23/23   Leak, Brandi L, NP  predniSONE  (DELTASONE ) 5 MG tablet TAKE 1 TABLET BY MOUTH EVERY DAY IN THE MORNING 05/22/23   Cheryl Waddell HERO, PA-C  senna (SENOKOT) 8.6 MG tablet Take 1 tablet by mouth 2 (two) times daily.    [provider]  sodium chloride  1 g tablet Take 1 tablet (1 g total) by mouth 2 (two) times daily with a meal. 12/23/23   Leak, Brandi L, NP  tamsulosin  (FLOMAX ) 0.4 MG CAPS capsule Take 1 capsule (0.4 mg total) by mouth daily after supper. 01/15/24   [name] PARAS, MD  traMADol  (ULTRAM ) 50 MG tablet Take 25-50 mg by mouth every 8 (eight) hours as needed for moderate pain (pain score 4-6).    [provider]  Upadacitinib  ER ([name] ) 15 MG TB24 Take 15 mg by mouth daily.    [provider]  Zinc  Sulfate 220 (50 Zn) MG TABS Take 1 tablet (220 mg total) by mouth daily with supper. 12/22/23   Leak, Brandi L, NP    Allergies: Darvon [propoxyphene hcl], Demerol [meperidine], Penicillins, Imuran [azathioprine], Sulfa  antibiotics, Arava [leflunomide], Lipitor [atorvastatin ], and Vytorin [ezetimibe-simvastatin]    Review of Systems  Musculoskeletal:  Positive for arthralgias and myalgias.  All other systems reviewed and are negative.   Updated Vital Signs BP (!) 146/76 (BP Location: Left Arm)   Pulse 91   Temp (!) 97 F (36.1 C) (Oral)   Resp 18   Ht 5' 1 (1.549 m)   Wt 47 kg   SpO2 100%   BMI 19.58 kg/m   Physical Exam Vitals and nursing note reviewed.  Constitutional:      General: She is not in acute distress.    Appearance: She is well-developed.  HENT:     Head: Normocephalic and atraumatic.  Eyes:     Conjunctiva/sclera: Conjunctivae normal.  Cardiovascular:     Rate and Rhythm: Normal rate and  regular rhythm.     Heart sounds: No murmur heard.    Comments: 2+ DP pulse in right foot Pulmonary:     Effort: Pulmonary effort is normal. No respiratory distress.     Breath sounds: Normal breath sounds.  Abdominal:     Palpations: Abdomen is soft.     Tenderness: There is no abdominal tenderness.  Musculoskeletal:        General: No swelling.     Cervical back: Neck supple.     Comments: Shortening and rotation of right lower extremity.  Slight tenting of skin to right lateral hip.  Skin:    General: Skin is warm and  dry.     Capillary Refill: Capillary refill takes less than 2 seconds.  Neurological:     Mental Status: She is alert and oriented to person, place, and time. Mental status is at baseline.  Psychiatric:        Mood and Affect: Mood normal.     (all labs ordered are listed, but only abnormal results are displayed) Labs Reviewed  CBC - Abnormal; Notable for the following components:      Result Value   RBC 2.90 (*)    Hemoglobin 9.0 (*)    HCT 28.3 (*)    RDW 18.6 (*)    All other components within normal limits  BASIC METABOLIC PANEL WITH GFR - Abnormal; Notable for the following components:   Sodium 131 (*)    Chloride 97 (*)    Glucose, Bld 116 (*)    BUN 6 (*)    Calcium  8.1 (*)    All other components within normal limits    EKG: None  Radiology: DG Hip Unilat W or Wo Pelvis 2-3 Views Right Result Date: 01/18/2024 CLINICAL DATA:  Recent right hip repair with possible dislocation EXAM: DG HIP (WITH OR WITHOUT PELVIS) 2-3V RIGHT COMPARISON:  12/16/2023 FINDINGS: Right femoral prosthesis is dislocated superiorly with respect to the acetabulum. Left hip replacement and postsurgical changes in the lumbosacral region are seen. No acute fracture is noted. No other focal abnormality is noted. IMPRESSION: Right hip dislocation Electronically Signed   By: Oneil Devonshire M.D.   On: 01/18/2024 23:37    Procedures   Medications Ordered in the ED  propofol   (DIPRIVAN ) 10 mg/mL bolus/IV push 23.5 mg ( Intravenous Canceled Entry 01/19/24 0213)  propofol  (DIPRIVAN ) 10 mg/mL bolus/IV push (20 mg Intravenous Given 01/19/24 0204)  fentaNYL  (SUBLIMAZE ) injection 50 mcg (50 mcg Intravenous Given 01/19/24 0145)    Clinical Course as of 01/19/24 0323  Mon Jan 18, 2024  2327 Chronically hyponatremic, baseline hemoglobin [CG]  2332 R olecranon fx (5/14), right femoral neck fx (6/17), right THA (6/19), dislocated THA (6/22), closed reduction right THA (6/23), and ORIF right olecranon (6/27) [CG]  2337 Chronic prednisone , has open wound and open sutures to R hip from previous operation. Attending does not wish to reduce. [CG]    Clinical Course User Index [CG] Ruthell Lonni FALCON, PA-C   Medical Decision Making Amount and/or Complexity of Data Reviewed Labs: ordered. Radiology: ordered.  Risk Prescription drug management. Decision regarding hospitalization.   This is an 83 year old female who presents to the ED out of concern of right hip dislocation.  Patient has had numerous right hip dislocations and right hip arthroplasty revisions in the last 1 month with Dr. Fidel.  She states that she was sleeping, woke up with right hip pain.  Denies any falls to account for this pain.  On exam, the patient is afebrile and nontachycardic.  Her lung sounds are clear bilaterally, no hypoxia.  Abdomen is soft and compressible.  Neuroexam at baseline.  Patient right lower extremity does have shortening and rotation.  There is tenting of the skin to the lateral right portion of the hip with a open wound from previous revision.  She has duplicity pulse into the right foot.  She is unable to lift her right leg off of the bed.   Patient x-ray imaging reveals a right hip dislocation.  Basic labs are collected which shows a CBC with a baseline hemoglobin and no leukocytosis.  Metabolic panel reveals chronic hyponatremia and no  other electrolyte derangement.  Patient  case discussed with on-call orthopedic surgeon Dr. Duwayne who has requested that we attempt to reduce patient hip.  Update: Attempted reduction of patient hip under procedural sedation.  Unsuccessful.  Dr. Duwayne has requested that we admit patient to hospitalist.  Ortho will consult on the patient in the morning for ultimate plan.  Discussed with Dr. Franky of hospitalist service.  Has agreed to admit patient.  Patient made NPO.   Final diagnoses:  Closed dislocation of right hip, subsequent encounter    ED Discharge Orders     None          Ruthell Lonni JULIANNA DEVONNA 01/19/24 0323    Griselda Norris, MD 01/19/24 [phone]

## 2024-01-18 NOTE — Telephone Encounter (Signed)
 Spoke with patient's daughter Luke today.

## 2024-01-18 NOTE — Telephone Encounter (Signed)
 Copied from CRM [id number]. Topic: Clinical - Home Health Verbal Orders >> Jan 18, 2024  2:25 PM Drema MATSU wrote: Caller/Agency: [facility] Number: [phone] Service Requested: Skilled Nursing Frequency: 1 week 9 Any new concerns about the patient? Yes Patient doesn't know if she should be taking her furosemide  (LASIX ) 20 MG tablet. She stated that patient legs are swollen in the afternoon when she is about to go to bed. [facility] is needing a updated current list of what medication patient is taking. Fax: [phone]

## 2024-01-18 NOTE — ED Triage Notes (Signed)
 Coming from home. Had a right hip repair in July and since then has had several instances where the hip popped out. Was walking to the bathroom tonight and felt a sudden onset of pain and inability to walk. Per EMS, swelling/deformity noted to the hip. +pedal pulses.

## 2024-01-18 NOTE — ED Provider Notes (Incomplete)
 [place] [facility] Provider Note   CSN: [id number] Arrival date & time: 01/18/24  2122     Patient presents with: Hip Pain   Sherri [name] is a 83 y.o. 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  Patient presents to ED for evaluation of right hip pain.  Here with daughter who provides history.  Per patient daughter, patient woke up this evening around 3 PM an extreme amount of atraumatic pain.  Patient daughter reports that patient has had 2 spontaneous hip dislocations after having a hip revision 2 months ago.  States that today was with caregiver when patient began complaining of pain.  Has taken home oxycodone  without relief.  Denies trauma, falls.  Denies nausea, vomiting, fevers.  Reports follows with Dr. Fidel of [facility].  Apparently patient has follow-up tomorrow morning at 10 AM with Dr. Fidel in office.   Hip Pain       Prior to Admission medications   Medication Sig Start Date End Date Taking? Authorizing Provider  acetaminophen  (TYLENOL ) 325 MG tablet Take 2 tablets (650 mg total) by mouth every 6 (six) hours as needed for mild pain (pain score 1-3) or fever (or Fever >/= 101). 11/20/23   Dorinda Drue DASEN, MD  ascorbic acid  (VITAMIN C ) 1000 MG tablet Take 1 tablet (1,000 mg total) by mouth daily. 12/23/23   Leak, Brandi L, NP  atorvastatin  (LIPITOR) 10 MG tablet Take 1 tablet (10 mg total) by mouth every evening. 12/22/23   Leak, Brandi L, NP  Biotin  5000 MCG TABS Take 10,000 mcg by mouth in the morning.    [provider]  bisacodyl  (DULCOLAX) 10 MG suppository Place 1 suppository (10 mg total) rectally daily as needed for moderate constipation. 11/20/23   Dorinda Drue DASEN, MD  Carboxymethylcellul-Glycerin  (LUBRICATING EYE DROPS OP)  Place 1 drop into both eyes daily as needed (dry eyes).    [provider]  cholecalciferol  (VITAMIN D ) 25 MCG (1000 UNIT) tablet Take 1,000 Units by mouth in the morning.    [provider]  cyclobenzaprine  (FLEXERIL ) 10 MG tablet TAKE 1 TABLET BY MOUTH THREE TIMES A DAY AS NEEDED FOR MUSCLE SPASM 06/11/23   [name] PARAS, MD  denosumab  (PROLIA ) 60 MG/ML SOSY injection Inject 60 mg into the skin every 6 (six) months. Deliver to rheum: [address], [place], [place] [address]. Appt on 07/23/2022 07/17/22 11/26/24  Cheryl Waddell HERO, PA-C  diazepam  (VALIUM ) 5 MG tablet TAKE 1 TABLET BY MOUTH AT BEDTIME AS NEEDED FOR MUSCLE SPASMS. 04/28/23   [name] PARAS, MD  docusate sodium  (COLACE) 100 MG capsule Take 1 capsule (100 mg total) by mouth daily. 11/21/23   Dorinda Drue DASEN, MD  DULoxetine  (CYMBALTA ) 20 MG capsule Take 1 capsule (20 mg total) by mouth at bedtime. 01/18/24   [name] PARAS, MD  folic acid  (FOLVITE ) 1 MG tablet Take 1 tablet (1 mg total) by mouth daily. 09/02/23   Cheryl Waddell HERO, PA-C  furosemide  (LASIX ) 20 MG tablet Take 0.5 tablets (10 mg total) by mouth daily. 12/23/23   Leak, Brandi L, NP  magic mouthwash (nystatin , lidocaine , diphenhydrAMINE , alum & mag hydroxide) suspension Swish and spit 5 mLs 3 (three) times daily as needed for mouth pain. 01/13/24   [name] PARAS, MD  methotrexate  (RHEUMATREX) 2.5  MG tablet Take 5 tablets (12.5 mg total) by mouth once a week. Caution:Chemotherapy. Protect from light. 09/02/23   Cheryl Waddell HERO, PA-C  Nystatin  (GERHARDT'S BUTT CREAM) CREA Apply 1 Application topically 3 (three) times daily. 01/06/24   [name] PARAS, MD  oxyCODONE  (ROXICODONE ) 5 MG immediate release tablet Take 1 tablet (5 mg total) by mouth every 4 (four) hours as needed for severe pain (pain score 7-10). 01/14/24   [name] PARAS, MD  pantoprazole  (PROTONIX ) 40 MG tablet Take 1 tablet (40 mg total) by mouth 2 (two) times daily before a meal. 07/27/23   Burns, Glade PARAS, MD   polyethylene glycol powder (GLYCOLAX /MIRALAX ) 17 GM/SCOOP powder Take 17 g by mouth 2 (two) times daily. 12/22/23   Leak, Brandi L, NP  potassium chloride  SA (KLOR-CON  M) 20 MEQ tablet Take 0.5 tablets (10 mEq total) by mouth every other day. 12/23/23   Leak, Brandi L, NP  predniSONE  (DELTASONE ) 5 MG tablet TAKE 1 TABLET BY MOUTH EVERY DAY IN THE MORNING 05/22/23   Cheryl Waddell HERO, PA-C  senna (SENOKOT) 8.6 MG tablet Take 1 tablet by mouth 2 (two) times daily.    [provider]  sodium chloride  1 g tablet Take 1 tablet (1 g total) by mouth 2 (two) times daily with a meal. 12/23/23   Leak, Brandi L, NP  tamsulosin  (FLOMAX ) 0.4 MG CAPS capsule Take 1 capsule (0.4 mg total) by mouth daily after supper. 01/15/24   [name] PARAS, MD  traMADol  (ULTRAM ) 50 MG tablet Take 25-50 mg by mouth every 8 (eight) hours as needed for moderate pain (pain score 4-6).    [provider]  Upadacitinib  ER ([name] ) 15 MG TB24 Take 15 mg by mouth daily.    [provider]  Zinc  Sulfate 220 (50 Zn) MG TABS Take 1 tablet (220 mg total) by mouth daily with supper. 12/22/23   Leak, Brandi L, NP    Allergies: Darvon [propoxyphene hcl], Demerol [meperidine], Penicillins, Imuran [azathioprine], Sulfa  antibiotics, Arava [leflunomide], Lipitor [atorvastatin ], and Vytorin [ezetimibe-simvastatin]    Review of Systems  Updated Vital Signs BP 114/71   Pulse 94   Resp 14   Ht 5' 1 (1.549 m)   Wt 47 kg   SpO2 98%   BMI 19.58 kg/m   Physical Exam  (all labs ordered are listed, but only abnormal results are displayed) Labs Reviewed - No data to display  EKG: None  Radiology: No results found.  {Document cardiac monitor, telemetry assessment procedure when appropriate:32947} Procedures   Medications Ordered in the ED - No data to display    {Click here for ABCD2, HEART and other calculators REFRESH Note before signing:1}                              Medical Decision Making Amount and/or  Complexity of Data Reviewed Labs: ordered. Radiology: ordered.   ***  {Document critical care time when appropriate  Document review of labs and clinical decision tools ie CHADS2VASC2, etc  Document your independent review of radiology images and any outside records  Document your discussion with family members, caretakers and with consultants  Document social determinants of health affecting pt's care  Document your decision making why or why not admission, treatments were needed:32947:::1}   Final diagnoses:  None    ED Discharge Orders     None

## 2024-01-19 ENCOUNTER — Encounter (HOSPITAL_COMMUNITY)
Admission: EM | Disposition: A | Discharge status: Home Health | Payer: Self-pay | Source: Home / Self Care | Attending: Internal Medicine

## 2024-01-19 ENCOUNTER — Encounter (HOSPITAL_COMMUNITY): Payer: Self-pay | Admitting: Internal Medicine

## 2024-01-19 ENCOUNTER — Emergency Department (HOSPITAL_COMMUNITY)

## 2024-01-19 DIAGNOSIS — M0579 Rheumatoid arthritis with rheumatoid factor of multiple sites without organ or systems involvement: Secondary | ICD-10-CM | POA: Diagnosis not present

## 2024-01-19 DIAGNOSIS — S73004A Unspecified dislocation of right hip, initial encounter: Secondary | ICD-10-CM | POA: Diagnosis not present

## 2024-01-19 DIAGNOSIS — G8929 Other chronic pain: Secondary | ICD-10-CM | POA: Diagnosis present

## 2024-01-19 DIAGNOSIS — E7849 Other hyperlipidemia: Secondary | ICD-10-CM | POA: Diagnosis not present

## 2024-01-19 DIAGNOSIS — T84020A Dislocation of internal right hip prosthesis, initial encounter: Secondary | ICD-10-CM | POA: Diagnosis not present

## 2024-01-19 DIAGNOSIS — K219 Gastro-esophageal reflux disease without esophagitis: Secondary | ICD-10-CM

## 2024-01-19 DIAGNOSIS — D638 Anemia in other chronic diseases classified elsewhere: Secondary | ICD-10-CM

## 2024-01-19 DIAGNOSIS — E871 Hypo-osmolality and hyponatremia: Secondary | ICD-10-CM | POA: Diagnosis not present

## 2024-01-19 DIAGNOSIS — G35 Multiple sclerosis: Secondary | ICD-10-CM

## 2024-01-19 SURGERY — CLOSED REDUCTION, HIP
Anesthesia: General | Site: Hip | Laterality: Right

## 2024-01-19 MED ORDER — SODIUM CHLORIDE 0.9 % IV SOLN: INTRAVENOUS | Status: DC

## 2024-01-19 MED ORDER — ONDANSETRON HCL 4 MG/2ML IJ SOLN: 4.0000 mg | Freq: Four times a day (QID) | INTRAMUSCULAR | Status: DC | PRN

## 2024-01-19 MED ORDER — TAMSULOSIN HCL 0.4 MG PO CAPS
0.4000 mg | ORAL_CAPSULE | Freq: Every day | ORAL | Status: DC
  Administered 2024-01-19 – 2024-01-25 (×6): 0.4 mg via ORAL
  Filled 2024-01-19 (×6): qty 1

## 2024-01-19 MED ORDER — ACETAMINOPHEN 650 MG RE SUPP: 650.0000 mg | Freq: Four times a day (QID) | RECTAL | Status: DC | PRN

## 2024-01-19 MED ORDER — PROPOFOL 10 MG/ML IV BOLUS
0.5000 mg/kg | Freq: Once | INTRAVENOUS | Status: DC
  Filled 2024-01-19: qty 20

## 2024-01-19 MED ORDER — HYDROMORPHONE HCL 1 MG/ML IJ SOLN
0.5000 mg | Freq: Once | INTRAMUSCULAR | Status: AC
  Administered 2024-01-19: 0.5 mg via INTRAVENOUS
  Filled 2024-01-19: qty 1

## 2024-01-19 MED ORDER — DOCUSATE SODIUM 100 MG PO CAPS
100.0000 mg | ORAL_CAPSULE | Freq: Every day | ORAL | Status: DC
  Administered 2024-01-20 – 2024-01-26 (×6): 100 mg via ORAL
  Filled 2024-01-19 (×6): qty 1

## 2024-01-19 MED ORDER — FENTANYL CITRATE PF 50 MCG/ML IJ SOSY
50.0000 ug | PREFILLED_SYRINGE | Freq: Once | INTRAMUSCULAR | Status: AC
  Administered 2024-01-19: 50 ug via INTRAVENOUS
  Filled 2024-01-19: qty 1

## 2024-01-19 MED ORDER — PANTOPRAZOLE SODIUM 40 MG PO TBEC
40.0000 mg | DELAYED_RELEASE_TABLET | Freq: Two times a day (BID) | ORAL | Status: DC
  Administered 2024-01-20 – 2024-01-26 (×11): 40 mg via ORAL
  Filled 2024-01-19 (×11): qty 1

## 2024-01-19 MED ORDER — BISACODYL 10 MG RE SUPP: 10.0000 mg | Freq: Every day | RECTAL | Status: DC | PRN

## 2024-01-19 MED ORDER — HYDROMORPHONE HCL 1 MG/ML IJ SOLN
0.5000 mg | INTRAMUSCULAR | Status: DC | PRN
  Administered 2024-01-19 – 2024-01-22 (×5): 1 mg via INTRAVENOUS
  Filled 2024-01-19 (×6): qty 1

## 2024-01-19 MED ORDER — PREDNISONE 5 MG PO TABS
5.0000 mg | ORAL_TABLET | Freq: Every day | ORAL | Status: DC
  Administered 2024-01-20 – 2024-01-26 (×6): 5 mg via ORAL
  Filled 2024-01-19 (×6): qty 1

## 2024-01-19 MED ORDER — METHOTREXATE SODIUM 2.5 MG PO TABS
12.5000 mg | ORAL_TABLET | ORAL | Status: DC
  Filled 2024-01-19: qty 5

## 2024-01-19 MED ORDER — POLYETHYLENE GLYCOL 3350 17 G PO PACK
17.0000 g | PACK | Freq: Every day | ORAL | Status: DC
  Administered 2024-01-20 – 2024-01-26 (×6): 17 g via ORAL
  Filled 2024-01-19 (×6): qty 1

## 2024-01-19 MED ORDER — POLYETHYLENE GLYCOL 3350 17 GM/SCOOP PO POWD: 17.0000 g | Freq: Every day | ORAL | Status: DC

## 2024-01-19 MED ORDER — SENNA 8.6 MG PO TABS
1.0000 | ORAL_TABLET | Freq: Every day | ORAL | Status: DC
  Administered 2024-01-19 – 2024-01-25 (×6): 8.6 mg via ORAL
  Filled 2024-01-19 (×7): qty 1

## 2024-01-19 MED ORDER — OXYCODONE HCL 5 MG PO TABS
5.0000 mg | ORAL_TABLET | ORAL | Status: DC | PRN
  Administered 2024-01-19 – 2024-01-26 (×19): 5 mg via ORAL
  Filled 2024-01-19 (×19): qty 1

## 2024-01-19 MED ORDER — HYDRALAZINE HCL 20 MG/ML IJ SOLN: 10.0000 mg | INTRAMUSCULAR | Status: DC | PRN

## 2024-01-19 MED ORDER — FOLIC ACID 1 MG PO TABS
1.0000 mg | ORAL_TABLET | Freq: Every day | ORAL | Status: DC
  Administered 2024-01-20 – 2024-01-26 (×6): 1 mg via ORAL
  Filled 2024-01-19 (×6): qty 1

## 2024-01-19 MED ORDER — MAGNESIUM SULFATE IN D5W 1-5 GM/100ML-% IV SOLN
1.0000 g | Freq: Once | INTRAVENOUS | Status: AC
  Administered 2024-01-19: 1 g via INTRAVENOUS
  Filled 2024-01-19: qty 100

## 2024-01-19 MED ORDER — PROPOFOL 10 MG/ML IV BOLUS
INTRAVENOUS | Status: DC | PRN
  Administered 2024-01-19 (×2): 20 mg via INTRAVENOUS

## 2024-01-19 MED ORDER — CYCLOBENZAPRINE HCL 10 MG PO TABS
10.0000 mg | ORAL_TABLET | Freq: Three times a day (TID) | ORAL | Status: DC | PRN
  Administered 2024-01-19 – 2024-01-26 (×6): 10 mg via ORAL
  Filled 2024-01-19 (×7): qty 1

## 2024-01-19 MED ORDER — DULOXETINE HCL 20 MG PO CPEP
20.0000 mg | ORAL_CAPSULE | Freq: Every day | ORAL | Status: DC
  Administered 2024-01-19 – 2024-01-25 (×7): 20 mg via ORAL
  Filled 2024-01-19 (×7): qty 1

## 2024-01-19 MED ORDER — ACETAMINOPHEN 325 MG PO TABS
650.0000 mg | ORAL_TABLET | Freq: Four times a day (QID) | ORAL | Status: DC | PRN
  Administered 2024-01-23 – 2024-01-26 (×5): 650 mg via ORAL
  Filled 2024-01-19 (×6): qty 2

## 2024-01-19 MED ORDER — SODIUM CHLORIDE 0.9% FLUSH
3.0000 mL | Freq: Two times a day (BID) | INTRAVENOUS | Status: DC
  Administered 2024-01-19 – 2024-01-26 (×14): 3 mL via INTRAVENOUS

## 2024-01-19 MED ORDER — ONDANSETRON HCL 4 MG PO TABS: 4.0000 mg | ORAL_TABLET | Freq: Four times a day (QID) | ORAL | Status: DC | PRN

## 2024-01-19 MED ORDER — SODIUM CHLORIDE 0.9 % IV SOLN: INTRAVENOUS | Status: AC

## 2024-01-19 MED ORDER — SODIUM CHLORIDE 1 G PO TABS
1.0000 g | ORAL_TABLET | Freq: Two times a day (BID) | ORAL | Status: DC
  Administered 2024-01-20 – 2024-01-26 (×11): 1 g via ORAL
  Filled 2024-01-19 (×11): qty 1

## 2024-01-19 MED ORDER — DIAZEPAM 5 MG PO TABS: 5.0000 mg | ORAL_TABLET | Freq: Four times a day (QID) | ORAL | Status: DC | PRN

## 2024-01-19 MED ORDER — ATORVASTATIN CALCIUM 10 MG PO TABS
10.0000 mg | ORAL_TABLET | Freq: Every evening | ORAL | Status: DC
  Administered 2024-01-19 – 2024-01-25 (×6): 10 mg via ORAL
  Filled 2024-01-19 (×6): qty 1

## 2024-01-19 MED ORDER — POTASSIUM CHLORIDE CRYS ER 10 MEQ PO TBCR
10.0000 meq | EXTENDED_RELEASE_TABLET | ORAL | Status: DC
  Administered 2024-01-20 – 2024-01-26 (×4): 10 meq via ORAL
  Filled 2024-01-19 (×5): qty 1

## 2024-01-19 MED ORDER — ALBUTEROL SULFATE (2.5 MG/3ML) 0.083% IN NEBU: 2.5000 mg | INHALATION_SOLUTION | Freq: Four times a day (QID) | RESPIRATORY_TRACT | Status: DC | PRN

## 2024-01-19 MED ORDER — ENOXAPARIN SODIUM 30 MG/0.3ML IJ SOSY
30.0000 mg | PREFILLED_SYRINGE | INTRAMUSCULAR | Status: DC
  Administered 2024-01-19 – 2024-01-21 (×3): 30 mg via SUBCUTANEOUS
  Filled 2024-01-19 (×3): qty 0.3

## 2024-01-19 NOTE — TOC Initial Note (Addendum)
 Transition of Care [facility]) - Initial/Assessment Note    Patient Details  Name: Sherri [name]  FARZANA KOCI MRN: [id number] Date of Birth: [birth year]  Transition of Care [facility] CM/SW Contact:    Rosalva Jon Bloch, RN Phone Number: [phone] 01/19/2024, 1:15 PM  Clinical Narrative:    Presents with R hip pain. Hx of MS, HTN, RA, chronic fatigue, chronic pain, and s/p total  R THA on the right 11/12/2023 with revisions 11/16/2023 and 12/03/2023. From home with spouse and  daughter Luke. Active with [facility], PT, RN.[name] Pt without RX meds or transportation issues.  Plan:Per Ortho pt will require return to OR for open reduction.  IP CM following and will assist with needs...   Expected Discharge Plan: Home w Home Health Services Barriers to Discharge: Continued Medical Work up   Patient Goals and CMS Choice            Expected Discharge Plan and Services   Discharge Planning Services: CM Consult   Living arrangements for the past 2 months: Single Family Home                   Prior Living Arrangements/Services Living arrangements for the past 2 months: Single Family Home Lives with:: Adult Children Patient language and need for interpreter reviewed:: Yes Do you feel safe going back to the place where you live?: Yes      Need for Family Participation in Patient Care: Yes (Comment) Care giver support system in place?: Yes (comment) Current home services: DME (BSC, W/C, RW, [employer]) Criminal Activity/Legal Involvement Pertinent to Current Situation/Hospitalization: No - Comment as needed  Activities of Daily Living   ADL Screening (condition at time of admission) Does the patient have a NEW difficulty with bathing/dressing/toileting/self-feeding that is expected to last >3 days?: No Does the patient have a NEW difficulty with getting in/out of bed, walking, or climbing stairs that is expected to last >3 days?: Yes (Initiates electronic notice to provider for possible PT  consult) Does the patient have a NEW difficulty with communication that is expected to last >3 days?: Yes (Initiates electronic notice to provider for possible SLP consult) Is the patient deaf or have difficulty hearing?: Yes Does the patient have difficulty seeing, even when wearing glasses/contacts?: No Does the patient have difficulty concentrating, remembering, or making decisions?: No  Permission Sought/Granted                  Emotional Assessment       Orientation: : Oriented to Self, Oriented to Place, Oriented to  Time, Oriented to Situation Alcohol  / Substance Use: Not Applicable Psych Involvement: No (comment)  Admission diagnosis:  Hip dislocation, right (HCC) [S73.004A] Closed dislocation of right hip, subsequent encounter [S73.004D] Patient Active Problem List   Diagnosis Date Noted   Hip dislocation, right (HCC) 01/19/2024   Anemia of chronic disease 12/31/2023   Multiple skin tears 12/31/2023   Debility 12/08/2023   History of revision of total replacement of right hip joint 12/08/2023   Postoperative wound infection 12/08/2023   Closed fracture of right elbow 12/08/2023   Wound dehiscence 11/27/2023   Macrocytic anemia 11/27/2023   Cellulitis of right elbow 11/27/2023   Lightheadedness 11/10/2023   Physical deconditioning 11/10/2023   Parotid swelling 11/10/2023   Infected laceration of skin 11/10/2023   Skin tear of elbow without complication, right, initial encounter 11/10/2023   Hip fracture (HCC) 11/10/2023   Pressure injury of skin 10/03/2023   Hyponatremia 10/02/2023   Fall  at home, initial encounter 10/02/2023   Cellulitis of right lower extremity 10/02/2023   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   Cervical spondylosis with myelopathy and radiculopathy 04/23/2022   Constipation 03/04/2022   Upper back pain on left side 03/04/2022   Lumbar vertebral fracture, pathologic 09/20/2021   Degenerative spondylolisthesis  08/19/2021   Senile purpura (HCC) 12/19/2020   Restless leg 02/02/2020   Bilateral leg edema 01/11/2020   Cervical radiculopathy at C8 06/28/2019   Carpal tunnel syndrome on both sides 06/28/2019   Right sided sciatica 04/14/2018   Trochanteric bursitis of right hip 04/14/2018   H/O cervical spine surgery 11/10/2017   Dysphagia 10/26/2017   Gastroesophageal reflux disease 08/06/2017   Easy bruising 08/06/2017   Mid back pain 03/26/2017   Submandibular gland swelling 01/08/2017   High risk medication use 09/02/2016   Piriformis muscle pain 09/02/2016   Right hip pain 04/08/2016   Right knee pain 02/25/2016   Numbness and tingling in left hand 11/14/2014   Biceps tendon tear 10/10/2014   Insomnia 10/10/2014   Gait disturbance 06/13/2014   OP (osteoporosis) 01/18/2014   Other long term (current) drug therapy 01/18/2014   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 01/01/2013   History of colonic polyps 07/03/2010   Nocturia 05/28/2010   THYROID  NODULE 05/17/2008   Multiple sclerosis (HCC) 05/17/2008   Other fatigue 03/30/2007   Hyperlipidemia 08/20/2006   Essential hypertension 08/20/2006   Rheumatoid arthritis (HCC) 08/20/2006   Osteoarthritis 08/20/2006   CERVICAL CANCER, HX OF 08/20/2006   GASTRIC ULCER, HX OF 08/20/2006   PCP:  [name] PARAS, MD Pharmacy:   [facility] [phone] [address] [place], [place] - [address] [place] [address] Phone: [phone] Fax: [phone]  [facility] [address] [place] [address] Phone: [phone] Fax: [phone]  [facility] [place], [place] - [address] [place] [address] Phone: [phone] Fax: [phone]     Social Drivers of Health (SDOH) Social History: SDOH Screenings   Food Insecurity: No Food Insecurity (01/19/2024)  Housing: Unknown (01/19/2024)  Recent Concern: Housing - High Risk  (11/27/2023)  Transportation Needs: Patient Declined (01/19/2024)  Utilities: Patient Declined (01/19/2024)  Alcohol  Screen: Low Risk  (02/21/2022)  Depression (PHQ2-9): Low Risk  (07/27/2023)  Financial Resource Strain: Low Risk  (02/21/2022)  Physical Activity: Inactive (02/21/2022)  Social Connections: Socially Integrated (01/19/2024)  Stress: Stress Concern Present (02/21/2022)  Tobacco Use: Medium Risk (01/19/2024)   SDOH Interventions:     Readmission Risk Interventions     No data to display

## 2024-01-19 NOTE — ED Notes (Signed)
 CCMD is monitoring this pt.

## 2024-01-19 NOTE — ED Notes (Addendum)
 This RN noticed infiltration upon entering room. I contact pharmacy immediately and was recommended to apply heat and elevate. Infiltration noted. IP RN has been notified of infiltration. This RN placed iv team consult.

## 2024-01-19 NOTE — ED Provider Notes (Signed)
  Physical Exam  BP (!) 146/76 (BP Location: Left Arm)   Pulse 91   Temp (!) 97 F (36.1 C) (Oral)   Resp 18   Ht 5' 1 (1.549 m)   Wt 47 kg   SpO2 100%   BMI 19.58 kg/m   Physical Exam  Procedures  .Reduction of dislocation  Date/Time: 01/19/2024 6:13 AM  Performed by: Logan Ubaldo NOVAK, PA-C Authorized by: Logan Ubaldo NOVAK DEVONNA  Consent: Verbal consent obtained. Written consent obtained Consent given by: patient Patient understanding: patient states understanding of the procedure being performed Patient consent: the patient's understanding of the procedure matches consent given Procedure consent: procedure consent matches procedure scheduled Relevant documents: relevant documents present and verified Test results: test results available and properly labeled Imaging studies: imaging studies available Patient identity confirmed: verbally with patient  Sedation: Patient sedated: yes (See separate note)  Comments: Hip dislocation reduction attempted unsuccessfully.      ED Course / MDM   Clinical Course as of 01/19/24 0612  Mon Jan 18, 2024  2327 Chronically hyponatremic, baseline hemoglobin [CG]  2332 R olecranon fx (5/14), right femoral neck fx (6/17), right THA (6/19), dislocated THA (6/22), closed reduction right THA (6/23), and ORIF right olecranon (6/27) [CG]  2337 Chronic prednisone , has open wound and open sutures to R hip from previous operation. Attending does not wish to reduce. [CG]    Clinical Course User Index [CG] Ruthell Lonni FALCON, PA-C   Medical Decision Making Amount and/or Complexity of Data Reviewed Labs: ordered. Radiology: ordered.  Risk Prescription drug management. Decision regarding hospitalization.          Logan Ubaldo NOVAK DEVONNA 01/19/24 [id number]    Griselda Norris, MD 01/19/24 [phone]

## 2024-01-19 NOTE — H&P (Addendum)
 History and Physical    Patient: Sherri [name]  A Ades FMW:[id number] DOB: May 20, 1941 DOA: 01/18/2024 DOS: the patient was seen and examined on 01/19/2024 PCP: [name] PARAS, MD  Patient coming from: Home  Chief Complaint:  Chief Complaint  Patient presents with   Hip Pain   HPI: Sherri  [name] is a 83 y.o. female with medical history significant of hyperlipidemia, rheumatoid arthritis, multiple sclerosis, and chronic pain presents with extreme right hip pain. She is accompanied by her daughter.  Patient has had a history of of right olecranon fx (5/14), right femoral neck fx (6/17), right total hip arthroplasty (6/19), dislocated total hip arthroplasty (6/22), closed reduction right total hip arthroplasty (6/23), ORIF right olecranon (6/27), subsequent I&D with hardware removal and olecranon extension with triceps advancement (7/5), right hip dislocation unable to be reduced (7/7), and total hip arthroplasty revision with head, ball and liner change by Dr. Fidel on (7/10).  She experienced extreme pain in her right hip upon waking up after laying down around 3 PM the previous day. This is the same hip where she had a hip replacement and two prior dislocations, with the last surgery occurring in July. Attempts to reduce the dislocation in the emergency department were unsuccessful. She is currently experiencing significant pain, which impacts her mobility and daily activities.  She has been using a walker and was able to stand and pivot to get into a wheelchair prior to this incident. She receives 24/7 care at home. She has difficulty swallowing, even on good days.   In the emergency department patient was noted to be afebrile with stable vital signs.  Labs noted hemoglobin 9, sodium 131, chloride 97, and calcium  8.1.  X-rays noted right hip dislocation.  The ED provider attempted reduction with propofol , but repeat x-ray imaging showed persistent right hip dislocation.  Orthopedic surgery  have been consulted.  Patient have been given fentanyl  and Dilaudid  IV for pain.    Review of Systems: As mentioned in the history of present illness. All other systems reviewed and are negative. Past Medical History:  Diagnosis Date   Anxiety    takes Valium  daily as needed   Basal cell carcinoma 01/21/1988   left nostril (MOHS), sup-right calf (CX35FU)   Basal cell carcinoma 03/22/1996   right calf (CX35FU)   Basal cell carcinoma 03/31/1995   left wing nose   Bruises easily    d/t meds   Cancer (HCC)    basal cell ca, in situ- uterine    Cataracts, bilateral    removed bilateral   Chronic back pain    Dizziness    r/t to meds   GERD (gastroesophageal reflux disease)    no meds on a regular basis but will take Tums if needed   Headache(784.0)    r/t neck issues   History of bronchitis 6-110yrs ago   History of colon polyps    benign   HOH (hard of hearing)    wears hearing aids   Hyperlipidemia    takes Atorvastatin  on Mondays and Fridays   Hypertension    Joint pain    Joint swelling    Multiple sclerosis (HCC)    Neuromuscular disorder (HCC)    Dr. FABIENE Crete- [facility], follows [facility]   Nocturia    PONV (postoperative nausea and vomiting)    trouble urinating after surgery in 2014   Postoperative nausea and vomiting 01/11/2019   Rheumatoid arthritis (HCC)    Dr Lorrayne weekly, RA- hands- knees- feet  Rheumatoid arthritis(714.0)    Dr Dolphus takes Xeljanz  daily   Right wrist fracture    Skin cancer    Squamous cell carcinoma of skin 11/02/2001   in situ-right knee (cx30fu)   Squamous cell carcinoma of skin 11/12/2011   in situ-left forearm (CX35FU), in situ-left foot (CX35FU)   Squamous cell carcinoma of skin 01/22/2012   in situ-left lower forearm (txpbx)   Squamous cell carcinoma of skin 11/17/2012   right shin (txpbx)   Squamous cell carcinoma of skin 10/28/2013   in situ-right shoulder (CX35FU)   Squamous cell carcinoma of skin  04/28/2014   well diff-right shin (txpbx)   Squamous cell carcinoma of skin 11/02/2014   in situ-Left shin (txpbx)   Squamous cell carcinoma of skin 12/15/2014   in situ-Left hand (txpbx), bowens-left side chest (txpbx)   Squamous cell carcinoma of skin 05/09/2015   in situ-left hand (txpbx)    Squamous cell carcinoma of skin 05/07/2016   in situ-left inner shin,ant, in situ-left inner shin, post, in situ-left outer forearm, in situ-right knuckle   Squamous cell carcinoma of skin 03/1302018   in situ-left shoulder (txpbx), in situ-right inner shin (txpbx), in situ-top of left foot (txpbx), KA- left forearm (txpbx)   Squamous cell carcinoma of skin 12/02/2016   in situ-left inner shin (txpbx)   Squamous cell carcinoma of skin 03/04/2017   in situ-left outer sup, shin (txpbx), in situ- right 2nd knuckle finger (txpbx), in situ- Left wrist (txpbx)   Squamous cell carcinoma of skin 04/06/2018   in situ-above left knee inner (txpbx)   Squamous cell carcinoma of skin 04/21/2018   in situ-right top hand (txpbx)   Squamous cell carcinoma of skin 07/08/2018   in situ-right lower inner shin (txpbx)   Squamous cell carcinoma of skin 04/27/2019   in situ- Left neck(CX35FU), In situ- right neck (CX35FU)   Urinary retention    sees Dr.Wrenn about 2 times a yr   Past Surgical History:  Procedure Laterality Date   ABDOMINAL HYSTERECTOMY     1985   ANTERIOR CERVICAL DECOMP/DISCECTOMY FUSION N/A 04/23/2022   Procedure: Anterior Cervical Decompression/Discectomy Fusion - Cervical Seven-Thoracic One;  Surgeon: Louis Shove, MD;  Location: [facility];  Service: Neurosurgery;  Laterality: N/A;  3C   APPENDECTOMY     with TAH   BACK SURGERY     several   BREAST BIOPSY Right    Results were negative   BROW LIFT Bilateral 10/02/2016   Procedure: BILATERAL LOWER LID BLEPHAROPLASTY;  Surgeon: Laurie Loyd Redhead, MD;  Location: [facility];  Service: Plastics;  Laterality: Bilateral;   CARPAL TUNNEL RELEASE Right     cataracts     CERVICAL FUSION     Dr Leeann   CERVICAL FUSION  12/31/2012   Dr Leeann   CERVICAL FUSION  04/2022   CHEST TUBE INSERTION     for traumatic Pneumothorax   COLONOSCOPY  07/16/2010   normal    COLONOSCOPY W/ POLYPECTOMY  1997   negative since; Dr Debrah   ESOPHAGOGASTRODUODENOSCOPY  07/16/2010   normal   eye lid raise     EYE SURGERY Bilateral    cataracts removed - /w IOL   HARDWARE REMOVAL  08/2021   HIP CLOSED REDUCTION Right 11/16/2023   Procedure: CLOSED MANIPULATION, JOINT, HIP;  Surgeon: Ernie Cough, MD;  Location: [facility];  Service: Orthopedics;  Laterality: Right;   HIP CLOSED REDUCTION N/A 11/30/2023   Procedure: CLOSED REDUCTION, HIP;  Surgeon: Fidel Rogue,  MD;  Location: [facility];  Service: Orthopedics;  Laterality: N/A;   INCISION AND DRAINAGE OF WOUND Right 11/28/2023   Procedure: IRRIGATION AND DEBRIDEMENT WOUND; REMOVAL OF HARDWARE;  Surgeon: Edna Toribio LABOR, MD;  Location: [facility];  Service: Orthopedics;  Laterality: Right;  I & D RIGHT ELBOW   JOINT REPLACEMENT     LUMBAR FUSION     Dr Leeann   NASAL SINUS SURGERY     ORIF ELBOW FRACTURE Right 11/20/2023   Procedure: OPEN REDUCTION INTERNAL FIXATION (ORIF) ELBOW/OLECRANON FRACTURE;  Surgeon: [name] Franky SQUIBB, MD;  Location: [facility];  Service: Orthopedics;  Laterality: Right;   POSTERIOR CERVICAL FUSION/FORAMINOTOMY N/A 12/31/2012   Procedure: POSTERIOR LATERAL CERVICAL FUSION/FORAMINOTOMY LEVEL 1 CERVICAL THREE-FOUR WITH LATERAL MASS SCREWS;  Surgeon: Catalina CHRISTELLA Leeann, MD;  Location: [facility];  Service: Neurosurgery;  Laterality: N/A;   PTOSIS REPAIR Bilateral 10/02/2016   Procedure: INTERNAL PTOSIS REPAIR;  Surgeon: Laurie Loyd Redhead, MD;  Location: [facility];  Service: Plastics;  Laterality: Bilateral;   SKIN BIOPSY     THYROID  SURGERY     R lobe removed- 1972, has grown back - CT last done- 2018   TOTAL HIP ARTHROPLASTY  12/22/2011   Procedure: TOTAL HIP ARTHROPLASTY;  Surgeon: Dempsey JINNY Sensor, MD;   Location: [facility];  Service: Orthopedics;  Laterality: Left;   TOTAL HIP ARTHROPLASTY Right 11/12/2023   Procedure: ARTHROPLASTY, HIP, TOTAL, ANTERIOR APPROACH;  Surgeon: Fidel Rogue, MD;  Location: [facility];  Service: Orthopedics;  Laterality: Right;   TOTAL HIP REVISION Right 12/03/2023   Procedure: TOTAL HIP REVISION;  Surgeon: Fidel Rogue, MD;  Location: [facility];  Service: Orthopedics;  Laterality: Right;  Right acetabular revision Anterior approach   TOTAL SHOULDER ARTHROPLASTY     TUBAL LIGATION     UPPER GASTROINTESTINAL ENDOSCOPY  2012   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   WRIST SURGERY Left 09/16/2022   wrist/arm   Social History:  reports that she quit smoking about 42 years ago. Her smoking use included cigarettes. She started smoking about 62 years ago. She has a 50 pack-year smoking history. She has never been exposed to tobacco smoke. She has never used smokeless tobacco. She reports current alcohol  use of about 14.0 standard drinks of alcohol  per week. She reports that she does not use drugs.  Allergies  Allergen Reactions   Darvon [Propoxyphene Hcl] Shortness Of Breath    ? Dose Related ? Lowered respirations greatly   Demerol [Meperidine] Shortness Of Breath and Other (See Comments)    Respiratory Distress   Penicillins Hives and Other (See Comments)    Has patient had a PCN reaction causing immediate rash, facial/tongue/throat swelling, SOB or lightheadedness with hypotension: No SEVERE RASH INVOLVING MUCUS MEMBRANES or SKIN NECROSIS: #  #  #  YES  #  #  #  Has patient had a PCN reaction that required hospitalization No Has patient had a PCN reaction occurring within the last 10 years: No.    Imuran [Azathioprine] Other (See Comments)    Weakness   Sulfa  Antibiotics Nausea Only and Other (See Comments)    States had severe indigestion and heartburn and nausea and had to stop taking   Arava [Leflunomide] Other (See Comments)    Excessive weight gain   Lipitor  [Atorvastatin ] Nausea And Vomiting    Currently taking 2 times weekly - able to tolerate low dose    Vytorin [Ezetimibe-Simvastatin] Nausea And Vomiting    Family History  Problem Relation Age of Onset   Cancer Mother        pancreatic   Diabetes Mother    Pancreatic cancer Mother    Heart disease Father        Rheumatic   Cancer Father        ? stomach   Stomach cancer Father    Cancer Sister        stomach   Stomach cancer Sister    Cancer Maternal Aunt        X 4; ? primary   Heart disease Paternal Aunt    Cancer Maternal Grandmother        cervical   Colon cancer Other        Aunts   Multiple sclerosis Daughter    Hypertension Neg Hx    Stroke Neg Hx    Esophageal cancer Neg Hx    Rectal cancer Neg Hx     Prior to Admission medications   Medication Sig Start Date End Date Taking? Authorizing Provider  acetaminophen  (TYLENOL ) 325 MG tablet Take 2 tablets (650 mg total) by mouth every 6 (six) hours as needed for mild pain (pain score 1-3) or fever (or Fever >/= 101). 11/20/23   Dorinda Drue DASEN, MD  ascorbic acid  (VITAMIN C ) 1000 MG tablet Take 1 tablet (1,000 mg total) by mouth daily. 12/23/23   Leak, Brandi L, NP  atorvastatin  (LIPITOR) 10 MG tablet Take 1 tablet (10 mg total) by mouth every evening. 12/22/23   Leak, Brandi L, NP  Biotin  5000 MCG TABS Take 10,000 mcg by mouth in the morning.    [provider]  bisacodyl  (DULCOLAX) 10 MG suppository Place 1 suppository (10 mg total) rectally daily as needed for moderate constipation. 11/20/23   Dorinda Drue DASEN, MD  Carboxymethylcellul-Glycerin  (LUBRICATING EYE DROPS OP) Place 1 drop into both eyes daily as needed (dry eyes).    [provider]  cholecalciferol  (VITAMIN D ) 25 MCG (1000 UNIT) tablet Take 1,000 Units by mouth in the morning.    [provider]  cyclobenzaprine  (FLEXERIL ) 10 MG tablet TAKE 1 TABLET BY MOUTH THREE TIMES A DAY AS NEEDED FOR MUSCLE SPASM 06/11/23   [name] PARAS, MD   denosumab  (PROLIA ) 60 MG/ML SOSY injection Inject 60 mg into the skin every 6 (six) months. Deliver to rheum: [address], [address], [place], [place] [address]. Appt on 07/23/2022 07/17/22 11/26/24  Cheryl Waddell HERO, PA-C  diazepam  (VALIUM ) 5 MG tablet TAKE 1 TABLET BY MOUTH AT BEDTIME AS NEEDED FOR MUSCLE SPASMS. 04/28/23   [name] PARAS, MD  docusate sodium  (COLACE) 100 MG capsule Take 1 capsule (100 mg total) by mouth daily. 11/21/23   Dorinda Drue DASEN, MD  DULoxetine  (CYMBALTA ) 20 MG capsule Take 1 capsule (20 mg total) by mouth at bedtime. 01/18/24   [name] PARAS, MD  folic acid  (FOLVITE ) 1 MG tablet Take 1 tablet (1 mg total) by mouth daily. 09/02/23   Cheryl Waddell HERO, PA-C  furosemide  (LASIX ) 20 MG tablet Take 0.5 tablets (10 mg total) by mouth daily. 12/23/23   Leak, Brandi L, NP  magic mouthwash (nystatin , lidocaine , diphenhydrAMINE , alum & mag hydroxide) suspension Swish and spit 5 mLs 3 (three) times daily as needed for mouth pain. 01/13/24   [name] PARAS, MD  methotrexate  (RHEUMATREX) 2.5 MG tablet Take 5 tablets (12.5 mg total) by mouth once a week. Caution:Chemotherapy. Protect from light. 09/02/23   Cheryl Waddell HERO, PA-C  Nystatin  (GERHARDT'S BUTT  CREAM) CREA Apply 1 Application topically 3 (three) times daily. 01/06/24   [name] PARAS, MD  oxyCODONE  (ROXICODONE ) 5 MG immediate release tablet Take 1 tablet (5 mg total) by mouth every 4 (four) hours as needed for severe pain (pain score 7-10). 01/14/24   [name] PARAS, MD  pantoprazole  (PROTONIX ) 40 MG tablet Take 1 tablet (40 mg total) by mouth 2 (two) times daily before a meal. 07/27/23   Burns, Glade PARAS, MD  polyethylene glycol powder (GLYCOLAX /MIRALAX ) 17 GM/SCOOP powder Take 17 g by mouth 2 (two) times daily. 12/22/23   Leak, Brandi L, NP  potassium chloride  SA (KLOR-CON  M) 20 MEQ tablet Take 0.5 tablets (10 mEq total) by mouth every other day. 12/23/23   Leak, Brandi L, NP  predniSONE  (DELTASONE ) 5 MG tablet TAKE 1 TABLET BY MOUTH EVERY DAY IN THE  MORNING 05/22/23   Cheryl Waddell HERO, PA-C  senna (SENOKOT) 8.6 MG tablet Take 1 tablet by mouth 2 (two) times daily.    [provider]  sodium chloride  1 g tablet Take 1 tablet (1 g total) by mouth 2 (two) times daily with a meal. 12/23/23   Leak, Brandi L, NP  tamsulosin  (FLOMAX ) 0.4 MG CAPS capsule Take 1 capsule (0.4 mg total) by mouth daily after supper. 01/15/24   [name] PARAS, MD  traMADol  (ULTRAM ) 50 MG tablet Take 25-50 mg by mouth every 8 (eight) hours as needed for moderate pain (pain score 4-6).    [provider]  Upadacitinib  ER ([name] ) 15 MG TB24 Take 15 mg by mouth daily.    [provider]  Zinc  Sulfate 220 (50 Zn) MG TABS Take 1 tablet (220 mg total) by mouth daily with supper. 12/22/23   Monty Daphne CROME, NP    Physical Exam: Vitals:   01/19/24 0204 01/19/24 0209 01/19/24 0224 01/19/24 0600  BP: (!) 154/87 (!) 140/76 (!) 146/76 136/74  Pulse: 94 95 91 94  Resp: 19 18 18 14   Temp:  (!) 97.2 F (36.2 C) (!) 97 F (36.1 C)   TempSrc:  Oral Oral   SpO2: 100% 100% 100% 93%  Weight:      Height:       Constitutional: Thin elderly female who appears to be in distress Eyes: PERRL, lids and conjunctivae normal ENMT: Mucous membranes are moist.  Hard of hearing. Neck: normal, supple Respiratory: clear to auscultation bilaterally, no wheezing, no crackles. Normal respiratory effort. No accessory muscle use.  Cardiovascular: Regular rate and rhythm, no murmurs / rubs / gallops. No extremity edema. 2+ pedal pulses. No carotid bruits.  Abdomen: no tenderness, no masses palpated. No hepatosplenomegaly. Bowel sounds positive.  Musculoskeletal: no clubbing / cyanosis.  Deformity of the right hip with right lower extremity mildly shortened. Skin: no rashes, lesions, ulcers. No induration Neurologic: CN 2-12 grossly intact.  Strength 5/5 in all 4.  Psychiatric: Normal judgment and insight. Alert and oriented x 3. Normal mood.   Data Reviewed:  Reviewed  labs, imaging, and pertinent records as documented.  Assessment and Plan:  Right hip dislocation Patient presents with acute onset of right hip pain without any known trauma.  Patient has had 2 prior right hip arthroplasty dislocations.  Last occurring on 7/7 requiring surgical revision by Dr. Joanie on 7/10.  X-ray showed show dislocation of the right hip.  Orthopedics consulted. - Admit to a telemetry bed - N.p.o. after midnight in case of need of surgical procedure in a.m. - Oxycodone /Dilaudid  IV as needed  for moderate to severe pain respectively - Will need to consult PT once hip has been able to be reduced - Dr. Fidel of orthopedics consulted,  will follow-up for any further recommendation  Chronic hyponatremia Sodium noted to be 131 which appears around patient's baseline. - Continue salt tablet  Anemia of chronic disease Hemoglobin noted to be 9 which appears around patient's baseline. - Continue to monitor  Rheumatoid arthritis Patient is on methotrexate  12.5 mg on Wednesdays and prednisone . - Continue methotrexate  and prednisone    Hyperlipidemia - Continue atorvastatin   Chronic pain - Continue Cymbalta   History of multiple sclerosis She is no longer on medication for treatment.  GERD Continue Protonix   DVT prophylaxis: Lovenox  Advance Care Planning:   Code Status: Full Code   Consults: Orthopedics  Family Communication: Daughter updated at bedside  Severity of Illness: The appropriate patient status for this patient is OBSERVATION. Observation status is judged to be reasonable and necessary in order to provide the required intensity of service to ensure the patient's safety. The patient's presenting symptoms, physical exam findings, and initial radiographic and laboratory data in the context of their medical condition is felt to place them at decreased risk for further clinical deterioration. Furthermore, it is anticipated that the patient will be medically  stable for discharge from the hospital within 2 midnights of admission.   Author: Maximino DELENA Sharps, MD 01/19/2024 8:03 AM  For on call review [email or web address].

## 2024-01-19 NOTE — ED Provider Notes (Signed)
.  Sedation  Date/Time: 01/19/2024 5:18 AM  Performed by: Griselda Norris, MD Authorized by: Griselda Norris, MD   Consent:    Consent obtained:  Written   Consent given by:  Patient   Risks discussed:  Allergic reaction, prolonged hypoxia resulting in organ damage, prolonged sedation necessitating reversal, inadequate sedation and respiratory compromise necessitating ventilatory assistance and intubation Universal protocol:    Immediately prior to procedure, a time out was called: yes   Indications:    Procedure performed:  Dislocation reduction Pre-sedation assessment:    Time since last food or drink:  8   ASA classification: class 3 - patient with severe systemic disease     Mouth opening:  3 or more finger widths   Mallampati score:  II - soft palate, uvula, fauces visible   Neck mobility: normal     Pre-sedation assessments completed and reviewed: airway patency, cardiovascular function, mental status, nausea/vomiting and respiratory function   A pre-sedation assessment was completed prior to the start of the procedure Immediate pre-procedure details:    Reassessment: Patient reassessed immediately prior to procedure   Procedure details (see MAR for exact dosages):    Sedation:  Propofol    Intended level of sedation: deep   Total Provider sedation time (minutes):  20 Post-procedure details:   A post-sedation assessment was completed following the completion of the procedure.   Post-sedation assessments completed and reviewed: airway patency, cardiovascular function, hydration status, mental status, nausea/vomiting, pain level and respiratory function       Griselda Norris, MD 01/19/24 [phone]

## 2024-01-19 NOTE — Telephone Encounter (Signed)
 Called and left message for Sherri [name] today with verbals and instructions for Lasix .  Med list also faxed over to her attention today.

## 2024-01-19 NOTE — Consult Note (Cosign Needed)
 Reason for Consult:R hip dislocation Referring Physician: Dr. Griselda (ER)  Sherri [name] is an 83 y.o. female.  HPI: Hx of R hip revision with Dr. Fidel several months ago. Spontaneous dislocation overnight. No other c/o. Was supposed to see Dr. Fidel in office today.  Past Medical History:  Diagnosis Date   Anxiety    takes Valium  daily as needed   Basal cell carcinoma 01/21/1988   left nostril (MOHS), sup-right calf (CX35FU)   Basal cell carcinoma 03/22/1996   right calf (CX35FU)   Basal cell carcinoma 03/31/1995   left wing nose   Bruises easily    d/t meds   Cancer (HCC)    basal cell ca, in situ- uterine    Cataracts, bilateral    removed bilateral   Chronic back pain    Dizziness    r/t to meds   GERD (gastroesophageal reflux disease)    no meds on a regular basis but will take Tums if needed   Headache(784.0)    r/t neck issues   History of bronchitis 6-75yrs ago   History of colon polyps    benign   HOH (hard of hearing)    wears hearing aids   Hyperlipidemia    takes Atorvastatin  on Mondays and Fridays   Hypertension    Joint pain    Joint swelling    Multiple sclerosis (HCC)    Neuromuscular disorder (HCC)    Dr. FABIENE Crete- [facility], follows [facility]   Nocturia    PONV (postoperative nausea and vomiting)    trouble urinating after surgery in 2014   Postoperative nausea and vomiting 01/11/2019   Rheumatoid arthritis (HCC)    Dr Lorrayne weekly, RA- hands- knees- feet    Rheumatoid arthritis(714.0)    Dr Dolphus takes Xeljanz  daily   Right wrist fracture    Skin cancer    Squamous cell carcinoma of skin 11/02/2001   in situ-right knee (cx57fu)   Squamous cell carcinoma of skin 11/12/2011   in situ-left forearm (CX35FU), in situ-left foot (CX35FU)   Squamous cell carcinoma of skin 01/22/2012   in situ-left lower forearm (txpbx)   Squamous cell carcinoma of skin 11/17/2012   right shin (txpbx)   Squamous cell carcinoma of skin  10/28/2013   in situ-right shoulder (CX35FU)   Squamous cell carcinoma of skin 04/28/2014   well diff-right shin (txpbx)   Squamous cell carcinoma of skin 11/02/2014   in situ-Left shin (txpbx)   Squamous cell carcinoma of skin 12/15/2014   in situ-Left hand (txpbx), bowens-left side chest (txpbx)   Squamous cell carcinoma of skin 05/09/2015   in situ-left hand (txpbx)    Squamous cell carcinoma of skin 05/07/2016   in situ-left inner shin,ant, in situ-left inner shin, post, in situ-left outer forearm, in situ-right knuckle   Squamous cell carcinoma of skin 03/1302018   in situ-left shoulder (txpbx), in situ-right inner shin (txpbx), in situ-top of left foot (txpbx), KA- left forearm (txpbx)   Squamous cell carcinoma of skin 12/02/2016   in situ-left inner shin (txpbx)   Squamous cell carcinoma of skin 03/04/2017   in situ-left outer sup, shin (txpbx), in situ- right 2nd knuckle finger (txpbx), in situ- Left wrist (txpbx)   Squamous cell carcinoma of skin 04/06/2018   in situ-above left knee inner (txpbx)   Squamous cell carcinoma of skin 04/21/2018   in situ-right top hand (txpbx)   Squamous cell carcinoma of skin 07/08/2018   in situ-right lower inner shin (  txpbx)   Squamous cell carcinoma of skin 04/27/2019   in situ- Left neck(CX35FU), In situ- right neck (CX35FU)   Urinary retention    sees Dr.Wrenn about 2 times a yr    Past Surgical History:  Procedure Laterality Date   ABDOMINAL HYSTERECTOMY     1985   ANTERIOR CERVICAL DECOMP/DISCECTOMY FUSION N/A 04/23/2022   Procedure: Anterior Cervical Decompression/Discectomy Fusion - Cervical Seven-Thoracic One;  Surgeon: Louis Shove, MD;  Location: [facility];  Service: Neurosurgery;  Laterality: N/A;  3C   APPENDECTOMY     with TAH   BACK SURGERY     several   BREAST BIOPSY Right    Results were negative   BROW LIFT Bilateral 10/02/2016   Procedure: BILATERAL LOWER LID BLEPHAROPLASTY;  Surgeon: Laurie Loyd Redhead, MD;  Location:  [facility] OR;  Service: Plastics;  Laterality: Bilateral;   CARPAL TUNNEL RELEASE Right    cataracts     CERVICAL FUSION     Dr Leeann   CERVICAL FUSION  12/31/2012   Dr Leeann   CERVICAL FUSION  04/2022   CHEST TUBE INSERTION     for traumatic Pneumothorax   COLONOSCOPY  07/16/2010   normal    COLONOSCOPY W/ POLYPECTOMY  1997   negative since; Dr Debrah   ESOPHAGOGASTRODUODENOSCOPY  07/16/2010   normal   eye lid raise     EYE SURGERY Bilateral    cataracts removed - /w IOL   HARDWARE REMOVAL  08/2021   HIP CLOSED REDUCTION Right 11/16/2023   Procedure: CLOSED MANIPULATION, JOINT, HIP;  Surgeon: Ernie Cough, MD;  Location: [facility];  Service: Orthopedics;  Laterality: Right;   HIP CLOSED REDUCTION N/A 11/30/2023   Procedure: CLOSED REDUCTION, HIP;  Surgeon: Fidel Rogue, MD;  Location: [facility];  Service: Orthopedics;  Laterality: N/A;   INCISION AND DRAINAGE OF WOUND Right 11/28/2023   Procedure: IRRIGATION AND DEBRIDEMENT WOUND; REMOVAL OF HARDWARE;  Surgeon: Edna Toribio LABOR, MD;  Location: [facility];  Service: Orthopedics;  Laterality: Right;  I & D RIGHT ELBOW   JOINT REPLACEMENT     LUMBAR FUSION     Dr Leeann   NASAL SINUS SURGERY     ORIF ELBOW FRACTURE Right 11/20/2023   Procedure: OPEN REDUCTION INTERNAL FIXATION (ORIF) ELBOW/OLECRANON FRACTURE;  Surgeon: [name] Franky SQUIBB, MD;  Location: [facility];  Service: Orthopedics;  Laterality: Right;   POSTERIOR CERVICAL FUSION/FORAMINOTOMY N/A 12/31/2012   Procedure: POSTERIOR LATERAL CERVICAL FUSION/FORAMINOTOMY LEVEL 1 CERVICAL THREE-FOUR WITH LATERAL MASS SCREWS;  Surgeon: Catalina CHRISTELLA Leeann, MD;  Location: [facility];  Service: Neurosurgery;  Laterality: N/A;   PTOSIS REPAIR Bilateral 10/02/2016   Procedure: INTERNAL PTOSIS REPAIR;  Surgeon: Laurie Loyd Redhead, MD;  Location: [facility];  Service: Plastics;  Laterality: Bilateral;   SKIN BIOPSY     THYROID  SURGERY     R lobe removed- 1972, has grown back - CT last done- 2018   TOTAL HIP  ARTHROPLASTY  12/22/2011   Procedure: TOTAL HIP ARTHROPLASTY;  Surgeon: Dempsey JINNY Sensor, MD;  Location: [facility];  Service: Orthopedics;  Laterality: Left;   TOTAL HIP ARTHROPLASTY Right 11/12/2023   Procedure: ARTHROPLASTY, HIP, TOTAL, ANTERIOR APPROACH;  Surgeon: Fidel Rogue, MD;  Location: [facility];  Service: Orthopedics;  Laterality: Right;   TOTAL HIP REVISION Right 12/03/2023   Procedure: TOTAL HIP REVISION;  Surgeon: Fidel Rogue, MD;  Location: [facility];  Service: Orthopedics;  Laterality: Right;  Right acetabular revision Anterior approach   TOTAL SHOULDER ARTHROPLASTY  TUBAL LIGATION     UPPER GASTROINTESTINAL ENDOSCOPY  2012   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   WRIST SURGERY Left 09/16/2022   wrist/arm    Family History  Problem Relation Age of Onset   Cancer Mother        pancreatic   Diabetes Mother    Pancreatic cancer Mother    Heart disease Father        Rheumatic   Cancer Father        ? stomach   Stomach cancer Father    Cancer Sister        stomach   Stomach cancer Sister    Cancer Maternal Aunt        X 4; ? primary   Heart disease Paternal Aunt    Cancer Maternal Grandmother        cervical   Colon cancer Other        Aunts   Multiple sclerosis Daughter    Hypertension Neg Hx    Stroke Neg Hx    Esophageal cancer Neg Hx    Rectal cancer Neg Hx     Social History:  reports that she quit smoking about 42 years ago. Her smoking use included cigarettes. She started smoking about 62 years ago. She has a 50 pack-year smoking history. She has never been exposed to tobacco smoke. She has never used smokeless tobacco. She reports current alcohol  use of about 14.0 standard drinks of alcohol  per week. She reports that she does not use drugs.  Allergies:  Allergies  Allergen Reactions   Darvon [Propoxyphene Hcl] Shortness Of Breath    ? Dose Related ? Lowered respirations greatly   Demerol [Meperidine] Shortness Of Breath and Other (See Comments)     Respiratory Distress   Penicillins Hives and Other (See Comments)    Has patient had a PCN reaction causing immediate rash, facial/tongue/throat swelling, SOB or lightheadedness with hypotension: No SEVERE RASH INVOLVING MUCUS MEMBRANES or SKIN NECROSIS: #  #  #  YES  #  #  #  Has patient had a PCN reaction that required hospitalization No Has patient had a PCN reaction occurring within the last 10 years: No.    Imuran [Azathioprine] Other (See Comments)    Weakness   Sulfa  Antibiotics Nausea Only and Other (See Comments)    States had severe indigestion and heartburn and nausea and had to stop taking   Arava [Leflunomide] Other (See Comments)    Excessive weight gain   Lipitor [Atorvastatin ] Nausea And Vomiting    Currently taking 2 times weekly - able to tolerate low dose    Vytorin [Ezetimibe-Simvastatin] Nausea And Vomiting    Medications: I have reviewed the patient's current medications.  Results for orders placed or performed during the hospital encounter of 01/18/24 (from the past 48 hours)  CBC     Status: Abnormal   Collection Time: 01/18/24 10:39 PM  Result Value Ref Range   WBC 8.0 4.0 - 10.5 K/uL   RBC 2.90 (L) 3.87 - 5.11 MIL/uL   Hemoglobin 9.0 (L) 12.0 - 15.0 g/dL   HCT 71.6 (L) 63.9 - 53.9 %   MCV 97.6 80.0 - 100.0 fL   MCH 31.0 26.0 - 34.0 pg   MCHC 31.8 30.0 - 36.0 g/dL   RDW 81.3 (H) 88.4 - 84.4 %   Platelets 370 150 - 400 K/uL   nRBC 0.0 0.0 - 0.2 %    Comment: Performed at [facility]  [facility], [address] [place], [place] [address]  Basic metabolic panel     Status: Abnormal   Collection Time: 01/18/24 10:39 PM  Result Value Ref Range   Sodium 131 (L) 135 - 145 mmol/L   Potassium 3.5 3.5 - 5.1 mmol/L   Chloride 97 (L) 98 - 111 mmol/L   CO2 26 22 - 32 mmol/L   Glucose, Bld 116 (H) 70 - 99 mg/dL    Comment: Glucose reference range applies only to samples taken after fasting for at least 8 hours.   BUN 6 (L) 8 - 23 mg/dL   Creatinine, Ser 9.19 0.44 -  1.00 mg/dL   Calcium  8.1 (L) 8.9 - 10.3 mg/dL   GFR, Estimated >39 >39 mL/min    Comment: (NOTE) Calculated using the CKD-EPI Creatinine Equation (2021)    Anion gap 8 5 - 15    Comment: Performed at [facility], [address] [place], [place] [address]   *Note: Due to a large number of results and/or encounters for the requested time period, some results have not been displayed. A complete set of results can be found in Results Review.    DG HIP UNILAT WITH PELVIS 1V RIGHT Result Date: 01/19/2024 CLINICAL DATA:  Right hip pain EXAM: DG HIP (WITH OR WITHOUT PELVIS) 1V RIGHT COMPARISON:  Film from the previous day. FINDINGS: Right hip prosthesis remains superiorly dislocated. No other focal abnormality is noted IMPRESSION: Persistent right hip dislocation. Electronically Signed   By: Oneil Devonshire M.D.   On: 01/19/2024 03:36   DG Hip Unilat W or Wo Pelvis 2-3 Views Right Result Date: 01/18/2024 CLINICAL DATA:  Recent right hip repair with possible dislocation EXAM: DG HIP (WITH OR WITHOUT PELVIS) 2-3V RIGHT COMPARISON:  12/16/2023 FINDINGS: Right femoral prosthesis is dislocated superiorly with respect to the acetabulum. Left hip replacement and postsurgical changes in the lumbosacral region are seen. No acute fracture is noted. No other focal abnormality is noted. IMPRESSION: Right hip dislocation Electronically Signed   By: Oneil Devonshire M.D.   On: 01/18/2024 23:37    Review of Systems  Constitutional: Negative.   HENT: Negative.    Eyes: Negative.   Respiratory: Negative.    Cardiovascular: Negative.   Gastrointestinal: Negative.   Endocrine: Negative.   Genitourinary: Negative.   Musculoskeletal:  Positive for arthralgias and gait problem.  Hematological: Negative.   Psychiatric/Behavioral: Negative.     Blood pressure (!) 157/76, pulse (!) 105, temperature 97.7 F (36.5 C), resp. rate 16, height 5' 1 (1.549 m), weight 47 kg, SpO2 97%. Physical Exam Constitutional:       Appearance: Normal appearance.  HENT:     Head: Normocephalic and atraumatic.     Right Ear: External ear normal.     Left Ear: External ear normal.     Nose: Nose normal.     Mouth/Throat:     Pharynx: Oropharynx is clear.  Eyes:     Conjunctiva/sclera: Conjunctivae normal.  Cardiovascular:     Rate and Rhythm: Normal rate and regular rhythm.     Pulses: Normal pulses.  Pulmonary:     Effort: Pulmonary effort is normal.  Abdominal:     General: Bowel sounds are normal.  Musculoskeletal:     Cervical back: Normal range of motion.     Comments: Pt in  bed, comfortable at rest RLE shortened and rotated Sensation intact distally No calf pain or sign of DVT  Skin:    General: Skin  is warm and dry.  Neurological:     Mental Status: She is alert.     Assessment/Plan: Admit to medical service Will require return to OR for open reduction with Dr. Fidel Discussed with Dr. Fidel and Dr. Duwayne Pain control  Sherri [name] 01/19/2024, 1:00 PM

## 2024-01-19 NOTE — H&P (View-Only) (Signed)
 Reason for Consult:R hip dislocation Referring Physician: Dr. Griselda (ER)  Sherri  [name] is an 83 y.o. female.  HPI: Hx of R hip revision with Dr. Fidel several months ago. Spontaneous dislocation overnight. No other c/o. Was supposed to see Dr. Fidel in office today.  Past Medical History:  Diagnosis Date   Anxiety    takes Valium  daily as needed   Basal cell carcinoma 01/21/1988   left nostril (MOHS), sup-right calf (CX35FU)   Basal cell carcinoma 03/22/1996   right calf (CX35FU)   Basal cell carcinoma 03/31/1995   left wing nose   Bruises easily    d/t meds   Cancer (HCC)    basal cell ca, in situ- uterine    Cataracts, bilateral    removed bilateral   Chronic back pain    Dizziness    r/t to meds   GERD (gastroesophageal reflux disease)    no meds on a regular basis but will take Tums if needed   Headache(784.0)    r/t neck issues   History of bronchitis 6-75yrs ago   History of colon polyps    benign   HOH (hard of hearing)    wears hearing aids   Hyperlipidemia    takes Atorvastatin  on Mondays and Fridays   Hypertension    Joint pain    Joint swelling    Multiple sclerosis (HCC)    Neuromuscular disorder (HCC)    Dr. FABIENE Crete- [facility], follows [facility]   Nocturia    PONV (postoperative nausea and vomiting)    trouble urinating after surgery in 2014   Postoperative nausea and vomiting 01/11/2019   Rheumatoid arthritis (HCC)    Dr Lorrayne weekly, RA- hands- knees- feet    Rheumatoid arthritis(714.0)    Dr Dolphus takes Xeljanz  daily   Right wrist fracture    Skin cancer    Squamous cell carcinoma of skin 11/02/2001   in situ-right knee (cx57fu)   Squamous cell carcinoma of skin 11/12/2011   in situ-left forearm (CX35FU), in situ-left foot (CX35FU)   Squamous cell carcinoma of skin 01/22/2012   in situ-left lower forearm (txpbx)   Squamous cell carcinoma of skin 11/17/2012   right shin (txpbx)   Squamous cell carcinoma of skin  10/28/2013   in situ-right shoulder (CX35FU)   Squamous cell carcinoma of skin 04/28/2014   well diff-right shin (txpbx)   Squamous cell carcinoma of skin 11/02/2014   in situ-Left shin (txpbx)   Squamous cell carcinoma of skin 12/15/2014   in situ-Left hand (txpbx), bowens-left side chest (txpbx)   Squamous cell carcinoma of skin 05/09/2015   in situ-left hand (txpbx)    Squamous cell carcinoma of skin 05/07/2016   in situ-left inner shin,ant, in situ-left inner shin, post, in situ-left outer forearm, in situ-right knuckle   Squamous cell carcinoma of skin 03/1302018   in situ-left shoulder (txpbx), in situ-right inner shin (txpbx), in situ-top of left foot (txpbx), KA- left forearm (txpbx)   Squamous cell carcinoma of skin 12/02/2016   in situ-left inner shin (txpbx)   Squamous cell carcinoma of skin 03/04/2017   in situ-left outer sup, shin (txpbx), in situ- right 2nd knuckle finger (txpbx), in situ- Left wrist (txpbx)   Squamous cell carcinoma of skin 04/06/2018   in situ-above left knee inner (txpbx)   Squamous cell carcinoma of skin 04/21/2018   in situ-right top hand (txpbx)   Squamous cell carcinoma of skin 07/08/2018   in situ-right lower inner shin (  txpbx)   Squamous cell carcinoma of skin 04/27/2019   in situ- Left neck(CX35FU), In situ- right neck (CX35FU)   Urinary retention    sees Dr.Wrenn about 2 times a yr    Past Surgical History:  Procedure Laterality Date   ABDOMINAL HYSTERECTOMY     1985   ANTERIOR CERVICAL DECOMP/DISCECTOMY FUSION N/A 04/23/2022   Procedure: Anterior Cervical Decompression/Discectomy Fusion - Cervical Seven-Thoracic One;  Surgeon: Louis Shove, MD;  Location: [facility];  Service: Neurosurgery;  Laterality: N/A;  3C   APPENDECTOMY     with TAH   BACK SURGERY     several   BREAST BIOPSY Right    Results were negative   BROW LIFT Bilateral 10/02/2016   Procedure: BILATERAL LOWER LID BLEPHAROPLASTY;  Surgeon: Laurie Loyd Redhead, MD;  Location:  [facility] OR;  Service: Plastics;  Laterality: Bilateral;   CARPAL TUNNEL RELEASE Right    cataracts     CERVICAL FUSION     Dr Leeann   CERVICAL FUSION  12/31/2012   Dr Leeann   CERVICAL FUSION  04/2022   CHEST TUBE INSERTION     for traumatic Pneumothorax   COLONOSCOPY  07/16/2010   normal    COLONOSCOPY W/ POLYPECTOMY  1997   negative since; Dr Debrah   ESOPHAGOGASTRODUODENOSCOPY  07/16/2010   normal   eye lid raise     EYE SURGERY Bilateral    cataracts removed - /w IOL   HARDWARE REMOVAL  08/2021   HIP CLOSED REDUCTION Right 11/16/2023   Procedure: CLOSED MANIPULATION, JOINT, HIP;  Surgeon: Ernie Cough, MD;  Location: [facility];  Service: Orthopedics;  Laterality: Right;   HIP CLOSED REDUCTION N/A 11/30/2023   Procedure: CLOSED REDUCTION, HIP;  Surgeon: Fidel Rogue, MD;  Location: [facility];  Service: Orthopedics;  Laterality: N/A;   INCISION AND DRAINAGE OF WOUND Right 11/28/2023   Procedure: IRRIGATION AND DEBRIDEMENT WOUND; REMOVAL OF HARDWARE;  Surgeon: Edna Toribio LABOR, MD;  Location: [facility];  Service: Orthopedics;  Laterality: Right;  I & D RIGHT ELBOW   JOINT REPLACEMENT     LUMBAR FUSION     Dr Leeann   NASAL SINUS SURGERY     ORIF ELBOW FRACTURE Right 11/20/2023   Procedure: OPEN REDUCTION INTERNAL FIXATION (ORIF) ELBOW/OLECRANON FRACTURE;  Surgeon: [name] Franky SQUIBB, MD;  Location: [facility];  Service: Orthopedics;  Laterality: Right;   POSTERIOR CERVICAL FUSION/FORAMINOTOMY N/A 12/31/2012   Procedure: POSTERIOR LATERAL CERVICAL FUSION/FORAMINOTOMY LEVEL 1 CERVICAL THREE-FOUR WITH LATERAL MASS SCREWS;  Surgeon: Catalina CHRISTELLA Leeann, MD;  Location: [facility];  Service: Neurosurgery;  Laterality: N/A;   PTOSIS REPAIR Bilateral 10/02/2016   Procedure: INTERNAL PTOSIS REPAIR;  Surgeon: Laurie Loyd Redhead, MD;  Location: [facility];  Service: Plastics;  Laterality: Bilateral;   SKIN BIOPSY     THYROID  SURGERY     R lobe removed- 1972, has grown back - CT last done- 2018   TOTAL HIP  ARTHROPLASTY  12/22/2011   Procedure: TOTAL HIP ARTHROPLASTY;  Surgeon: Dempsey JINNY Sensor, MD;  Location: [facility];  Service: Orthopedics;  Laterality: Left;   TOTAL HIP ARTHROPLASTY Right 11/12/2023   Procedure: ARTHROPLASTY, HIP, TOTAL, ANTERIOR APPROACH;  Surgeon: Fidel Rogue, MD;  Location: [facility];  Service: Orthopedics;  Laterality: Right;   TOTAL HIP REVISION Right 12/03/2023   Procedure: TOTAL HIP REVISION;  Surgeon: Fidel Rogue, MD;  Location: [facility];  Service: Orthopedics;  Laterality: Right;  Right acetabular revision Anterior approach   TOTAL SHOULDER ARTHROPLASTY  TUBAL LIGATION     UPPER GASTROINTESTINAL ENDOSCOPY  2012   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   WRIST SURGERY Left 09/16/2022   wrist/arm    Family History  Problem Relation Age of Onset   Cancer Mother        pancreatic   Diabetes Mother    Pancreatic cancer Mother    Heart disease Father        Rheumatic   Cancer Father        ? stomach   Stomach cancer Father    Cancer Sister        stomach   Stomach cancer Sister    Cancer Maternal Aunt        X 4; ? primary   Heart disease Paternal Aunt    Cancer Maternal Grandmother        cervical   Colon cancer Other        Aunts   Multiple sclerosis Daughter    Hypertension Neg Hx    Stroke Neg Hx    Esophageal cancer Neg Hx    Rectal cancer Neg Hx     Social History:  reports that she quit smoking about 42 years ago. Her smoking use included cigarettes. She started smoking about 62 years ago. She has a 50 pack-year smoking history. She has never been exposed to tobacco smoke. She has never used smokeless tobacco. She reports current alcohol  use of about 14.0 standard drinks of alcohol  per week. She reports that she does not use drugs.  Allergies:  Allergies  Allergen Reactions   Darvon [Propoxyphene Hcl] Shortness Of Breath    ? Dose Related ? Lowered respirations greatly   Demerol [Meperidine] Shortness Of Breath and Other (See Comments)     Respiratory Distress   Penicillins Hives and Other (See Comments)    Has patient had a PCN reaction causing immediate rash, facial/tongue/throat swelling, SOB or lightheadedness with hypotension: No SEVERE RASH INVOLVING MUCUS MEMBRANES or SKIN NECROSIS: #  #  #  YES  #  #  #  Has patient had a PCN reaction that required hospitalization No Has patient had a PCN reaction occurring within the last 10 years: No.    Imuran [Azathioprine] Other (See Comments)    Weakness   Sulfa  Antibiotics Nausea Only and Other (See Comments)    States had severe indigestion and heartburn and nausea and had to stop taking   Arava [Leflunomide] Other (See Comments)    Excessive weight gain   Lipitor [Atorvastatin ] Nausea And Vomiting    Currently taking 2 times weekly - able to tolerate low dose    Vytorin [Ezetimibe-Simvastatin] Nausea And Vomiting    Medications: I have reviewed the patient's current medications.  Results for orders placed or performed during the hospital encounter of 01/18/24 (from the past 48 hours)  CBC     Status: Abnormal   Collection Time: 01/18/24 10:39 PM  Result Value Ref Range   WBC 8.0 4.0 - 10.5 K/uL   RBC 2.90 (L) 3.87 - 5.11 MIL/uL   Hemoglobin 9.0 (L) 12.0 - 15.0 g/dL   HCT 71.6 (L) 63.9 - 53.9 %   MCV 97.6 80.0 - 100.0 fL   MCH 31.0 26.0 - 34.0 pg   MCHC 31.8 30.0 - 36.0 g/dL   RDW 81.3 (H) 88.4 - 84.4 %   Platelets 370 150 - 400 K/uL   nRBC 0.0 0.0 - 0.2 %    Comment: Performed at [facility]  [facility], [address] [place], [place] [address]  Basic metabolic panel     Status: Abnormal   Collection Time: 01/18/24 10:39 PM  Result Value Ref Range   Sodium 131 (L) 135 - 145 mmol/L   Potassium 3.5 3.5 - 5.1 mmol/L   Chloride 97 (L) 98 - 111 mmol/L   CO2 26 22 - 32 mmol/L   Glucose, Bld 116 (H) 70 - 99 mg/dL    Comment: Glucose reference range applies only to samples taken after fasting for at least 8 hours.   BUN 6 (L) 8 - 23 mg/dL   Creatinine, Ser 9.19 0.44 -  1.00 mg/dL   Calcium  8.1 (L) 8.9 - 10.3 mg/dL   GFR, Estimated >39 >39 mL/min    Comment: (NOTE) Calculated using the CKD-EPI Creatinine Equation (2021)    Anion gap 8 5 - 15    Comment: Performed at [facility], [address] [place], [place] [address]   *Note: Due to a large number of results and/or encounters for the requested time period, some results have not been displayed. A complete set of results can be found in Results Review.    DG HIP UNILAT WITH PELVIS 1V RIGHT Result Date: 01/19/2024 CLINICAL DATA:  Right hip pain EXAM: DG HIP (WITH OR WITHOUT PELVIS) 1V RIGHT COMPARISON:  Film from the previous day. FINDINGS: Right hip prosthesis remains superiorly dislocated. No other focal abnormality is noted IMPRESSION: Persistent right hip dislocation. Electronically Signed   By: Oneil Devonshire M.D.   On: 01/19/2024 03:36   DG Hip Unilat W or Wo Pelvis 2-3 Views Right Result Date: 01/18/2024 CLINICAL DATA:  Recent right hip repair with possible dislocation EXAM: DG HIP (WITH OR WITHOUT PELVIS) 2-3V RIGHT COMPARISON:  12/16/2023 FINDINGS: Right femoral prosthesis is dislocated superiorly with respect to the acetabulum. Left hip replacement and postsurgical changes in the lumbosacral region are seen. No acute fracture is noted. No other focal abnormality is noted. IMPRESSION: Right hip dislocation Electronically Signed   By: Oneil Devonshire M.D.   On: 01/18/2024 23:37    Review of Systems  Constitutional: Negative.   HENT: Negative.    Eyes: Negative.   Respiratory: Negative.    Cardiovascular: Negative.   Gastrointestinal: Negative.   Endocrine: Negative.   Genitourinary: Negative.   Musculoskeletal:  Positive for arthralgias and gait problem.  Hematological: Negative.   Psychiatric/Behavioral: Negative.     Blood pressure (!) 157/76, pulse (!) 105, temperature 97.7 F (36.5 C), resp. rate 16, height 5' 1 (1.549 m), weight 47 kg, SpO2 97%. Physical Exam Constitutional:       Appearance: Normal appearance.  HENT:     Head: Normocephalic and atraumatic.     Right Ear: External ear normal.     Left Ear: External ear normal.     Nose: Nose normal.     Mouth/Throat:     Pharynx: Oropharynx is clear.  Eyes:     Conjunctiva/sclera: Conjunctivae normal.  Cardiovascular:     Rate and Rhythm: Normal rate and regular rhythm.     Pulses: Normal pulses.  Pulmonary:     Effort: Pulmonary effort is normal.  Abdominal:     General: Bowel sounds are normal.  Musculoskeletal:     Cervical back: Normal range of motion.     Comments: Pt in  bed, comfortable at rest RLE shortened and rotated Sensation intact distally No calf pain or sign of DVT  Skin:    General: Skin  is warm and dry.  Neurological:     Mental Status: She is alert.     Assessment/Plan: Admit to medical service Will require return to OR for open reduction with Dr. Fidel Discussed with Dr. Fidel and Dr. Duwayne Pain control  Sherri [name] 01/19/2024, 1:00 PM

## 2024-01-19 NOTE — ED Notes (Signed)
 CCMD notified. Pt on monitor.

## 2024-01-20 DIAGNOSIS — Z96649 Presence of unspecified artificial hip joint: Secondary | ICD-10-CM | POA: Diagnosis not present

## 2024-01-20 DIAGNOSIS — Z87891 Personal history of nicotine dependence: Secondary | ICD-10-CM | POA: Diagnosis not present

## 2024-01-20 DIAGNOSIS — Z833 Family history of diabetes mellitus: Secondary | ICD-10-CM | POA: Diagnosis not present

## 2024-01-20 DIAGNOSIS — S73004A Unspecified dislocation of right hip, initial encounter: Secondary | ICD-10-CM | POA: Diagnosis not present

## 2024-01-20 DIAGNOSIS — R404 Transient alteration of awareness: Secondary | ICD-10-CM | POA: Diagnosis not present

## 2024-01-20 DIAGNOSIS — A4152 Sepsis due to Pseudomonas: Secondary | ICD-10-CM | POA: Diagnosis not present

## 2024-01-20 DIAGNOSIS — Y831 Surgical operation with implant of artificial internal device as the cause of abnormal reaction of the patient, or of later complication, without mention of misadventure at the time of the procedure: Secondary | ICD-10-CM | POA: Diagnosis present

## 2024-01-20 DIAGNOSIS — T8459XA Infection and inflammatory reaction due to other internal joint prosthesis, initial encounter: Secondary | ICD-10-CM | POA: Diagnosis not present

## 2024-01-20 DIAGNOSIS — Z8249 Family history of ischemic heart disease and other diseases of the circulatory system: Secondary | ICD-10-CM | POA: Diagnosis not present

## 2024-01-20 DIAGNOSIS — A498 Other bacterial infections of unspecified site: Secondary | ICD-10-CM | POA: Diagnosis not present

## 2024-01-20 DIAGNOSIS — T8459XD Infection and inflammatory reaction due to other internal joint prosthesis, subsequent encounter: Secondary | ICD-10-CM | POA: Diagnosis not present

## 2024-01-20 DIAGNOSIS — S73004D Unspecified dislocation of right hip, subsequent encounter: Secondary | ICD-10-CM

## 2024-01-20 DIAGNOSIS — Z96642 Presence of left artificial hip joint: Secondary | ICD-10-CM | POA: Diagnosis not present

## 2024-01-20 DIAGNOSIS — Z85828 Personal history of other malignant neoplasm of skin: Secondary | ICD-10-CM | POA: Diagnosis not present

## 2024-01-20 DIAGNOSIS — T84020A Dislocation of internal right hip prosthesis, initial encounter: Secondary | ICD-10-CM | POA: Diagnosis not present

## 2024-01-20 DIAGNOSIS — Z880 Allergy status to penicillin: Secondary | ICD-10-CM | POA: Diagnosis not present

## 2024-01-20 DIAGNOSIS — Y792 Prosthetic and other implants, materials and accessory orthopedic devices associated with adverse incidents: Secondary | ICD-10-CM | POA: Diagnosis present

## 2024-01-20 DIAGNOSIS — E785 Hyperlipidemia, unspecified: Secondary | ICD-10-CM | POA: Diagnosis not present

## 2024-01-20 DIAGNOSIS — Z885 Allergy status to narcotic agent status: Secondary | ICD-10-CM | POA: Diagnosis not present

## 2024-01-20 DIAGNOSIS — M0579 Rheumatoid arthritis with rheumatoid factor of multiple sites without organ or systems involvement: Secondary | ICD-10-CM | POA: Diagnosis not present

## 2024-01-20 DIAGNOSIS — Z886 Allergy status to analgesic agent status: Secondary | ICD-10-CM | POA: Diagnosis not present

## 2024-01-20 DIAGNOSIS — R627 Adult failure to thrive: Secondary | ICD-10-CM | POA: Diagnosis not present

## 2024-01-20 DIAGNOSIS — K219 Gastro-esophageal reflux disease without esophagitis: Secondary | ICD-10-CM | POA: Diagnosis present

## 2024-01-20 DIAGNOSIS — Z7401 Bed confinement status: Secondary | ICD-10-CM | POA: Diagnosis not present

## 2024-01-20 DIAGNOSIS — D638 Anemia in other chronic diseases classified elsewhere: Secondary | ICD-10-CM | POA: Diagnosis present

## 2024-01-20 DIAGNOSIS — Z743 Need for continuous supervision: Secondary | ICD-10-CM | POA: Diagnosis not present

## 2024-01-20 DIAGNOSIS — G35 Multiple sclerosis: Secondary | ICD-10-CM | POA: Diagnosis present

## 2024-01-20 DIAGNOSIS — Z882 Allergy status to sulfonamides status: Secondary | ICD-10-CM | POA: Diagnosis not present

## 2024-01-20 DIAGNOSIS — B965 Pseudomonas (aeruginosa) (mallei) (pseudomallei) as the cause of diseases classified elsewhere: Secondary | ICD-10-CM | POA: Diagnosis present

## 2024-01-20 DIAGNOSIS — Z96643 Presence of artificial hip joint, bilateral: Secondary | ICD-10-CM | POA: Diagnosis not present

## 2024-01-20 DIAGNOSIS — M069 Rheumatoid arthritis, unspecified: Secondary | ICD-10-CM | POA: Diagnosis present

## 2024-01-20 DIAGNOSIS — Z79631 Long term (current) use of antimetabolite agent: Secondary | ICD-10-CM | POA: Diagnosis not present

## 2024-01-20 DIAGNOSIS — E871 Hypo-osmolality and hyponatremia: Secondary | ICD-10-CM | POA: Diagnosis present

## 2024-01-20 DIAGNOSIS — I10 Essential (primary) hypertension: Secondary | ICD-10-CM | POA: Diagnosis not present

## 2024-01-20 DIAGNOSIS — G8929 Other chronic pain: Secondary | ICD-10-CM | POA: Diagnosis present

## 2024-01-20 DIAGNOSIS — E876 Hypokalemia: Secondary | ICD-10-CM | POA: Diagnosis present

## 2024-01-20 DIAGNOSIS — Z96641 Presence of right artificial hip joint: Secondary | ICD-10-CM | POA: Diagnosis not present

## 2024-01-20 DIAGNOSIS — D62 Acute posthemorrhagic anemia: Secondary | ICD-10-CM | POA: Diagnosis present

## 2024-01-20 DIAGNOSIS — T8451XA Infection and inflammatory reaction due to internal right hip prosthesis, initial encounter: Secondary | ICD-10-CM | POA: Diagnosis not present

## 2024-01-20 LAB — BASIC METABOLIC PANEL WITH GFR
Anion gap: 11 (ref 5–15)
BUN: 6 mg/dL — ABNORMAL LOW (ref 8–23)
CO2: 25 mmol/L (ref 22–32)
Calcium: 8.2 mg/dL — ABNORMAL LOW (ref 8.9–10.3)
Chloride: 96 mmol/L — ABNORMAL LOW (ref 98–111)
Creatinine, Ser: 0.46 mg/dL (ref 0.44–1.00)
GFR, Estimated: >60 mL/min (ref >60)
Glucose, Bld: 78 mg/dL (ref 70–99)
Potassium: 3.3 mmol/L — ABNORMAL LOW (ref 3.5–5.1)
Sodium: 132 mmol/L — ABNORMAL LOW (ref 135–145)

## 2024-01-20 LAB — CBC
HCT: 32 % — ABNORMAL LOW (ref 36.0–46.0)
Hemoglobin: 10 g/dL — ABNORMAL LOW (ref 12.0–15.0)
MCH: 30.5 pg (ref 26.0–34.0)
MCHC: 31.3 g/dL (ref 30.0–36.0)
MCV: 97.6 fL (ref 80.0–100.0)
Platelets: 393 K/uL (ref 150–400)
RBC: 3.28 MIL/uL — ABNORMAL LOW (ref 3.87–5.11)
RDW: 18.6 % — ABNORMAL HIGH (ref 11.5–15.5)
WBC: 10.2 K/uL (ref 4.0–10.5)
nRBC: 0 % (ref 0.0–0.2)

## 2024-01-20 LAB — MAGNESIUM: Magnesium: 1.9 mg/dL (ref 1.7–2.4)

## 2024-01-20 MED ORDER — POTASSIUM CHLORIDE CRYS ER 20 MEQ PO TBCR
40.0000 meq | EXTENDED_RELEASE_TABLET | Freq: Once | ORAL | Status: AC
  Administered 2024-01-20: 40 meq via ORAL
  Filled 2024-01-20: qty 2

## 2024-01-20 NOTE — Plan of Care (Signed)
   Problem: Education: Goal: Knowledge of General Education information will improve Description: Including pain rating scale, medication(s)/side effects and non-pharmacologic comfort measures Outcome: Progressing   Problem: Clinical Measurements: Goal: Ability to maintain clinical measurements within normal limits will improve Outcome: Progressing   Problem: Activity: Goal: Risk for activity intolerance will decrease Outcome: Progressing   Problem: Nutrition: Goal: Adequate nutrition will be maintained Outcome: Progressing   Problem: Pain Managment: Goal: General experience of comfort will improve and/or be controlled Outcome: Progressing   Problem: Safety: Goal: Ability to remain free from injury will improve Outcome: Progressing   Problem: Skin Integrity: Goal: Risk for impaired skin integrity will decrease Outcome: Progressing

## 2024-01-20 NOTE — Progress Notes (Signed)
 PROGRESS NOTE  Sherri [name]  A Carmack FMW:[id number] DOB: 1940/11/10 DOA: 01/18/2024 PCP: [name] PARAS, MD   LOS: 0 days   Brief Narrative / Interim history: 83 year old female with RA, MS, chronic pain etiology closely right hip pain.  Was found to have persistent hip dislocation, orthopedic surgery was consulted and she was admitted to the hospital  Subjective / 24h Interval events: Hard of hearing, complains of significant pain  Assesement and Plan: Principal Problem:   Hip dislocation, right (HCC) Active Problems:   Hyponatremia   Anemia of chronic disease   Rheumatoid arthritis (HCC)   Hyperlipidemia   Chronic pain   Multiple sclerosis (HCC)   GERD (gastroesophageal reflux disease)  Principal problem Right hip dislocation-orthopedic surgery consulted, looks like she is scheduled for OR tomorrow.  Continue pain control meanwhile, PT/OT postoperatively  Active problems Chronic hyponatremia -continue salt tabs  Chronic disease-hemoglobin appears at baseline  RA-continue methotrexate   Anemia of chronic disease-no bleeding  Hypokalemia-replace potassium.  Magnesium  normal  Hyperlipidemia-continue statin  Chronic pain-continue Cymbalta   History of MS -not on active treatment  Scheduled Meds:  atorvastatin   10 mg Oral QPM   docusate sodium   100 mg Oral Daily   DULoxetine   20 mg Oral QHS   enoxaparin  (LOVENOX ) injection  30 mg Subcutaneous Q24H   folic acid   1 mg Oral Daily   methotrexate   12.5 mg Oral Q Wed   pantoprazole   40 mg Oral BID AC   polyethylene glycol  17 g Oral Daily   potassium chloride  SA  10 mEq Oral QODAY   predniSONE   5 mg Oral Q breakfast   senna  1 tablet Oral QHS   sodium chloride  flush  3 mL Intravenous Q12H   sodium chloride   1 g Oral BID WC   tamsulosin   0.4 mg Oral QPC supper   Continuous Infusions: PRN Meds:.acetaminophen  **OR** acetaminophen , albuterol , bisacodyl , cyclobenzaprine , diazepam , hydrALAZINE , HYDROmorphone  (DILAUDID )  injection, ondansetron  **OR** ondansetron  (ZOFRAN ) IV, oxyCODONE   Current Outpatient Medications  Medication Instructions   acetaminophen  (TYLENOL ) 650 mg, Oral, Every 6 hours PRN   atorvastatin  (LIPITOR) 10 mg, Oral, Every evening   Biotin  10,000 mcg, Oral, Every morning   bisacodyl  (DULCOLAX) 10 mg, Rectal, Daily PRN   Carboxymethylcellul-Glycerin  (LUBRICATING EYE DROPS OP) 1 drop, Both Eyes, Daily PRN   cholecalciferol  (VITAMIN D3) 1,000 Units, Oral, Every morning   cyclobenzaprine  (FLEXERIL ) 10 MG tablet TAKE 1 TABLET BY MOUTH THREE TIMES A DAY AS NEEDED FOR MUSCLE SPASM   diazepam  (VALIUM ) 5 MG tablet TAKE 1 TABLET BY MOUTH AT BEDTIME AS NEEDED FOR MUSCLE SPASMS.   docusate sodium  (COLACE) 100 mg, Oral, Daily   DULoxetine  (CYMBALTA ) 20 mg, Oral, Daily at bedtime   folic acid  (FOLVITE ) 1 mg, Oral, Daily   furosemide  (LASIX ) 10 mg, Oral, Daily   magic mouthwash (nystatin , lidocaine , diphenhydrAMINE , alum & mag hydroxide) suspension 5 mLs, Swish & Spit, 3 times daily PRN   methotrexate  (RHEUMATREX) 12.5 mg, Oral, Weekly, Caution:Chemotherapy. Protect from light.   Nystatin  (GERHARDT'S BUTT CREAM) CREA 1 Application, Topical, 3 times daily   oxyCODONE  (ROXICODONE ) 5 mg, Oral, Every 4 hours PRN   pantoprazole  (PROTONIX ) 40 mg, Oral, 2 times daily before meals   polyethylene glycol powder (GLYCOLAX /MIRALAX ) 17 g, Oral, 2 times daily   potassium chloride  SA (KLOR-CON  M) 20 MEQ tablet 10 mEq, Oral, Every other day   predniSONE  (DELTASONE ) 5 MG tablet TAKE 1 TABLET BY MOUTH EVERY DAY IN THE MORNING   Prolia  60 mg, Subcutaneous,  Every 6 months, Deliver to rheum: [address], [place], [place] [address]. Appt on 07/23/2022   [Paused] [name]  15 mg, Daily   senna (SENOKOT) 8.6 MG tablet 1 tablet, Oral, Daily at bedtime   sodium chloride  1 g, Oral, 2 times daily with meals   tamsulosin  (FLOMAX ) 0.4 mg, Oral, Daily after supper   traMADol  (ULTRAM ) 25-50 mg, Oral, Every 8 hours PRN    vitamin C  1,000 mg, Oral, Daily   Zinc  Sulfate 220 mg, Oral, Daily with supper    Diet Orders (From admission, onward)     Start     Ordered   01/19/24 1720  Diet regular Room service appropriate? Yes; Fluid consistency: Thin  Diet effective now       Question Answer Comment  Room service appropriate? Yes   Fluid consistency: Thin      01/19/24 1719            DVT prophylaxis: enoxaparin  (LOVENOX ) injection 30 mg Start: 01/19/24 0815   Lab Results  Component Value Date   PLT 393 01/20/2024      Code Status: Full Code  Family Communication: No family at bedside  Status is: Observation The patient will require care spanning > 2 midnights and should be moved to inpatient because: Surgery tomorrow   Level of care: Telemetry Medical  Consultants:  Orthopedic surgery  Objective: Vitals:   01/19/24 1956 01/20/24 0043 01/20/24 0546 01/20/24 0740  BP: (!) 157/77  (!) 156/76 133/76  Pulse: (!) 113 (!) 113 (!) 102 99  Resp: 19  16 18   Temp: 99.1 F (37.3 C)  98.1 F (36.7 C) 98.3 F (36.8 C)  TempSrc:      SpO2: (!) 71% 100% 100% 100%  Weight:      Height:        Intake/Output Summary (Last 24 hours) at 01/20/2024 1010 Last data filed at 01/20/2024 0230 Gross per 24 hour  Intake --  Output 550 ml  Net -550 ml   Wt Readings from Last 3 Encounters:  01/18/24 47 kg  12/22/23 47.6 kg  12/03/23 48.6 kg    Examination:  Constitutional: NAD Eyes: no scleral icterus ENMT: Mucous membranes are moist.  Neck: normal, supple Respiratory: clear to auscultation bilaterally, no wheezing, no crackles.  Cardiovascular: Regular rate and rhythm, no murmurs / rubs / gallops.  Abdomen: non distended, no tenderness. Bowel sounds positive.  Musculoskeletal: no clubbing / cyanosis.   Data Reviewed: I have independently reviewed following labs and imaging studies   CBC Recent Labs  Lab 01/18/24 2239 01/20/24 0521  WBC 8.0 10.2  HGB 9.0* 10.0*  HCT 28.3* 32.0*  PLT  370 393  MCV 97.6 97.6  MCH 31.0 30.5  MCHC 31.8 31.3  RDW 18.6* 18.6*    Recent Labs  Lab 01/18/24 2239 01/20/24 0521  NA 131* 132*  K 3.5 3.3*  CL 97* 96*  CO2 26 25  GLUCOSE 116* 78  BUN 6* 6*  CREATININE 0.80 0.46  CALCIUM  8.1* 8.2*  MG  --  1.9    ------------------------------------------------------------------------------------------------------------------ No results for input(s): CHOL, HDL, LDLCALC, TRIG, CHOLHDL, LDLDIRECT in the last 72 hours.  Lab Results  Component Value Date   HGBA1C 6.0 (H) 08/06/2023   ------------------------------------------------------------------------------------------------------------------ No results for input(s): TSH, T4TOTAL, T3FREE, THYROIDAB in the last 72 hours.  Invalid input(s): FREET3  Cardiac Enzymes No results for input(s): CKMB, TROPONINI, MYOGLOBIN in the last 168 hours.  Invalid input(s): CK ------------------------------------------------------------------------------------------------------------------  Component Value Date/Time   BNP 137 (H) 12/09/2019 1427    CBG: No results for input(s): GLUCAP in the last 168 hours.  No results found for this or any previous visit (from the past 240 hours).   Radiology Studies: No results found.   Nilda Fendt, MD, PhD [facility]  Between 7 am - 7 pm I am available, please contact me via Amion (for emergencies) or Securechat (non urgent messages)  Between 7 pm - 7 am I am not available, please contact night coverage MD/APP via Amion

## 2024-01-21 ENCOUNTER — Ambulatory Visit: Payer: Self-pay | Admitting: Student

## 2024-01-21 ENCOUNTER — Encounter: Attending: Registered Nurse | Admitting: Registered Nurse

## 2024-01-21 ENCOUNTER — Inpatient Hospital Stay (HOSPITAL_COMMUNITY): Admitting: Certified Registered Nurse Anesthetist

## 2024-01-21 ENCOUNTER — Other Ambulatory Visit: Payer: Self-pay

## 2024-01-21 ENCOUNTER — Encounter (HOSPITAL_COMMUNITY)
Admission: EM | Disposition: A | Discharge status: Home Health | Payer: Self-pay | Source: Home / Self Care | Attending: Internal Medicine

## 2024-01-21 ENCOUNTER — Inpatient Hospital Stay (HOSPITAL_COMMUNITY)

## 2024-01-21 ENCOUNTER — Encounter (HOSPITAL_COMMUNITY): Payer: Self-pay | Admitting: Internal Medicine

## 2024-01-21 DIAGNOSIS — E785 Hyperlipidemia, unspecified: Secondary | ICD-10-CM | POA: Diagnosis not present

## 2024-01-21 DIAGNOSIS — Z87891 Personal history of nicotine dependence: Secondary | ICD-10-CM

## 2024-01-21 DIAGNOSIS — I10 Essential (primary) hypertension: Secondary | ICD-10-CM | POA: Diagnosis not present

## 2024-01-21 DIAGNOSIS — S73004D Unspecified dislocation of right hip, subsequent encounter: Secondary | ICD-10-CM | POA: Diagnosis not present

## 2024-01-21 DIAGNOSIS — S73004A Unspecified dislocation of right hip, initial encounter: Secondary | ICD-10-CM

## 2024-01-21 HISTORY — PX: ANTERIOR HIP REVISION: SHX6527

## 2024-01-21 LAB — SURGICAL PCR SCREEN
MRSA, PCR: NEGATIVE
Staphylococcus aureus: NEGATIVE

## 2024-01-21 LAB — PREPARE RBC (CROSSMATCH)

## 2024-01-21 SURGERY — REVISION, TOTAL ARTHROPLASTY, HIP, ANTERIOR APPROACH
Anesthesia: General | Site: Hip | Laterality: Right

## 2024-01-21 MED ORDER — LACTATED RINGERS IV SOLN: INTRAVENOUS | Status: DC

## 2024-01-21 MED ORDER — STERILE WATER FOR IRRIGATION IR SOLN
Status: DC | PRN
Start: 2024-01-21 — End: 2024-01-21
  Administered 2024-01-21: 2000 mL

## 2024-01-21 MED ORDER — SUGAMMADEX SODIUM 200 MG/2ML IV SOLN
INTRAVENOUS | Status: AC
  Filled 2024-01-21: qty 2

## 2024-01-21 MED ORDER — FENTANYL CITRATE (PF) 100 MCG/2ML IJ SOLN
INTRAMUSCULAR | Status: DC | PRN
  Administered 2024-01-21 (×2): 25 ug via INTRAVENOUS
  Administered 2024-01-21: 50 ug via INTRAVENOUS

## 2024-01-21 MED ORDER — DIPHENHYDRAMINE HCL 12.5 MG/5ML PO ELIX: 12.5000 mg | ORAL_SOLUTION | ORAL | Status: DC | PRN

## 2024-01-21 MED ORDER — SODIUM CHLORIDE 0.9% IV SOLUTION: Freq: Once | INTRAVENOUS | Status: DC

## 2024-01-21 MED ORDER — CHLORHEXIDINE GLUCONATE 0.12 % MT SOLN
15.0000 mL | Freq: Once | OROMUCOSAL | Status: AC
  Administered 2024-01-21: 15 mL via OROMUCOSAL

## 2024-01-21 MED ORDER — SODIUM CHLORIDE 0.9% IV SOLUTION
INTRAVENOUS | Status: AC | PRN
  Administered 2024-01-21: 1000 mL via INTRAMUSCULAR

## 2024-01-21 MED ORDER — PHENYLEPHRINE 80 MCG/ML (10ML) SYRINGE FOR IV PUSH (FOR BLOOD PRESSURE SUPPORT)
PREFILLED_SYRINGE | INTRAVENOUS | Status: DC | PRN
  Administered 2024-01-21 (×3): 80 ug via INTRAVENOUS

## 2024-01-21 MED ORDER — VANCOMYCIN HCL 10 G IV SOLR: 1000.0000 mg | Freq: Once | INTRAVENOUS | Status: DC

## 2024-01-21 MED ORDER — ALBUMIN HUMAN 5 % IV SOLN
INTRAVENOUS | Status: DC | PRN
Start: 2024-01-21 — End: 2024-01-21

## 2024-01-21 MED ORDER — SODIUM CHLORIDE (PF) 0.9 % IJ SOLN
INTRAMUSCULAR | Status: AC
  Filled 2024-01-21: qty 30

## 2024-01-21 MED ORDER — FENTANYL CITRATE PF 50 MCG/ML IJ SOSY
25.0000 ug | PREFILLED_SYRINGE | Freq: Once | INTRAMUSCULAR | Status: AC
  Administered 2024-01-21: 25 ug via INTRAVENOUS

## 2024-01-21 MED ORDER — ISOPROPYL ALCOHOL 70 % SOLN
Status: DC | PRN
  Administered 2024-01-21: 1 via TOPICAL

## 2024-01-21 MED ORDER — ROCURONIUM BROMIDE 100 MG/10ML IV SOLN
INTRAVENOUS | Status: DC | PRN
  Administered 2024-01-21: 40 mg via INTRAVENOUS
  Administered 2024-01-21: 30 mg via INTRAVENOUS

## 2024-01-21 MED ORDER — METOCLOPRAMIDE HCL 5 MG PO TABS: 5.0000 mg | ORAL_TABLET | Freq: Three times a day (TID) | ORAL | Status: DC | PRN

## 2024-01-21 MED ORDER — VANCOMYCIN HCL IN DEXTROSE 1-5 GM/200ML-% IV SOLN
INTRAVENOUS | Status: AC
  Filled 2024-01-21: qty 200

## 2024-01-21 MED ORDER — LIDOCAINE HCL (PF) 2 % IJ SOLN
INTRAMUSCULAR | Status: AC
  Filled 2024-01-21: qty 5

## 2024-01-21 MED ORDER — FENTANYL CITRATE PF 50 MCG/ML IJ SOSY
PREFILLED_SYRINGE | INTRAMUSCULAR | Status: AC
  Filled 2024-01-21: qty 1

## 2024-01-21 MED ORDER — SODIUM CHLORIDE 0.9 % IR SOLN
Status: DC | PRN
  Administered 2024-01-21: 1000 mL
  Administered 2024-01-21: 3000 mL

## 2024-01-21 MED ORDER — ROCURONIUM BROMIDE 10 MG/ML (PF) SYRINGE
PREFILLED_SYRINGE | INTRAVENOUS | Status: AC
  Filled 2024-01-21: qty 10

## 2024-01-21 MED ORDER — OXYCODONE HCL 5 MG PO TABS: 5.0000 mg | ORAL_TABLET | ORAL | 0 refills | Status: DC | PRN

## 2024-01-21 MED ORDER — METOCLOPRAMIDE HCL 5 MG/ML IJ SOLN: 5.0000 mg | Freq: Three times a day (TID) | INTRAMUSCULAR | Status: DC | PRN

## 2024-01-21 MED ORDER — LIDOCAINE HCL (CARDIAC) PF 100 MG/5ML IV SOSY
PREFILLED_SYRINGE | INTRAVENOUS | Status: DC | PRN
  Administered 2024-01-21: 60 mg via INTRAVENOUS

## 2024-01-21 MED ORDER — FENTANYL CITRATE PF 50 MCG/ML IJ SOSY
25.0000 ug | PREFILLED_SYRINGE | INTRAMUSCULAR | Status: DC | PRN
  Administered 2024-01-21: 50 ug via INTRAVENOUS

## 2024-01-21 MED ORDER — VANCOMYCIN HCL 1000 MG IV SOLR
INTRAVENOUS | Status: DC | PRN
  Administered 2024-01-21: 1000 mg via INTRAVENOUS

## 2024-01-21 MED ORDER — SODIUM CHLORIDE 0.9 % IV SOLN: INTRAVENOUS | Status: DC

## 2024-01-21 MED ORDER — VANCOMYCIN HCL 1000 MG IV SOLR
INTRAVENOUS | Status: AC
  Filled 2024-01-21: qty 20

## 2024-01-21 MED ORDER — CEFAZOLIN SODIUM-DEXTROSE 2-3 GM-%(50ML) IV SOLR
INTRAVENOUS | Status: DC | PRN
  Administered 2024-01-21: 2 g via INTRAVENOUS

## 2024-01-21 MED ORDER — CEFAZOLIN SODIUM-DEXTROSE 2-4 GM/100ML-% IV SOLN
2.0000 g | Freq: Four times a day (QID) | INTRAVENOUS | Status: AC
  Administered 2024-01-21 – 2024-01-22 (×2): 2 g via INTRAVENOUS
  Filled 2024-01-21 (×2): qty 100

## 2024-01-21 MED ORDER — ONDANSETRON HCL 4 MG/2ML IJ SOLN
INTRAMUSCULAR | Status: DC | PRN
  Administered 2024-01-21: 4 mg via INTRAVENOUS

## 2024-01-21 MED ORDER — CEFADROXIL 500 MG PO CAPS: 500.0000 mg | ORAL_CAPSULE | Freq: Two times a day (BID) | ORAL | Status: DC

## 2024-01-21 MED ORDER — SUGAMMADEX SODIUM 200 MG/2ML IV SOLN
INTRAVENOUS | Status: DC | PRN
  Administered 2024-01-21: 100 mg via INTRAVENOUS

## 2024-01-21 MED ORDER — PROPOFOL 10 MG/ML IV BOLUS
INTRAVENOUS | Status: DC | PRN
  Administered 2024-01-21: 50 mg via INTRAVENOUS

## 2024-01-21 MED ORDER — PROPOFOL 10 MG/ML IV BOLUS
INTRAVENOUS | Status: AC
  Filled 2024-01-21: qty 20

## 2024-01-21 MED ORDER — VITAMIN C 500 MG PO TABS
1000.0000 mg | ORAL_TABLET | Freq: Every day | ORAL | Status: DC
  Administered 2024-01-22 – 2024-01-26 (×5): 1000 mg via ORAL
  Filled 2024-01-21 (×5): qty 2

## 2024-01-21 MED ORDER — ONDANSETRON HCL 4 MG/2ML IJ SOLN
INTRAMUSCULAR | Status: AC
  Filled 2024-01-21: qty 2

## 2024-01-21 MED ORDER — ZINC SULFATE 220 (50 ZN) MG PO CAPS
220.0000 mg | ORAL_CAPSULE | Freq: Every day | ORAL | Status: DC
  Administered 2024-01-22 – 2024-01-25 (×4): 220 mg via ORAL
  Filled 2024-01-21 (×4): qty 1

## 2024-01-21 MED ORDER — DEXAMETHASONE SODIUM PHOSPHATE 10 MG/ML IJ SOLN
INTRAMUSCULAR | Status: AC
  Filled 2024-01-21: qty 1

## 2024-01-21 MED ORDER — KETOROLAC TROMETHAMINE 30 MG/ML IJ SOLN
INTRAMUSCULAR | Status: AC
  Filled 2024-01-21: qty 1

## 2024-01-21 MED ORDER — FENTANYL CITRATE (PF) 100 MCG/2ML IJ SOLN
INTRAMUSCULAR | Status: AC
  Filled 2024-01-21: qty 2

## 2024-01-21 MED ORDER — BUPIVACAINE-EPINEPHRINE (PF) 0.25% -1:200000 IJ SOLN
INTRAMUSCULAR | Status: AC
  Filled 2024-01-21: qty 30

## 2024-01-21 SURGICAL SUPPLY — 54 items
BAG COUNTER SPONGE SURGICOUNT (BAG) IMPLANT
BAG ZIPLOCK 12X15 (MISCELLANEOUS) IMPLANT
BLADE SAW SGTL 11.0X1.19X90.0M (BLADE) IMPLANT
CHLORAPREP W/TINT 26 (MISCELLANEOUS) ×2 IMPLANT
COVER PERINEAL POST (MISCELLANEOUS) ×2 IMPLANT
COVER SURGICAL LIGHT HANDLE (MISCELLANEOUS) ×2 IMPLANT
CUP SUCTION HIP G7 STRL (Cup) IMPLANT
DERMABOND ADVANCED .7 DNX12 (GAUZE/BANDAGES/DRESSINGS) ×2 IMPLANT
DRAPE IMP U-DRAPE 54X76 (DRAPES) ×2 IMPLANT
DRAPE SHEET LG 3/4 BI-LAMINATE (DRAPES) ×6 IMPLANT
DRAPE STERI IOBAN 125X83 (DRAPES) ×2 IMPLANT
DRAPE U-SHAPE 47X51 STRL (DRAPES) ×4 IMPLANT
DRESSING PREVENA PLUS CUSTOM (GAUZE/BANDAGES/DRESSINGS) IMPLANT
DRSG AQUACEL AG ADV 3.5X10 (GAUZE/BANDAGES/DRESSINGS) ×2 IMPLANT
ELECT BLADE TIP CTD 4 INCH (ELECTRODE) ×2 IMPLANT
EVACUATOR DRAINAGE 10X20 100CC (DRAIN) IMPLANT
EVACUATOR DRAINAGE 7X20 100CC (MISCELLANEOUS) IMPLANT
GAUZE 4X4 16PLY ~~LOC~~+RFID DBL (SPONGE) ×2 IMPLANT
GLOVE BIO SURGEON STRL SZ8.5 (GLOVE) ×4 IMPLANT
GLOVE BIOGEL M 7.0 STRL (GLOVE) ×2 IMPLANT
GLOVE BIOGEL PI IND STRL 7.5 (GLOVE) ×4 IMPLANT
GLOVE BIOGEL PI IND STRL 8 (GLOVE) ×2 IMPLANT
GLOVE BIOGEL PI IND STRL 8.5 (GLOVE) ×2 IMPLANT
GLOVE SURG LX STRL 7.5 STRW (GLOVE) ×4 IMPLANT
GOWN SPEC L3 XXLG W/TWL (GOWN DISPOSABLE) ×4 IMPLANT
HEAD MODULAR STD 28MM (Orthopedic Implant) IMPLANT
HOLDER FOLEY CATH W/STRAP (MISCELLANEOUS) ×2 IMPLANT
HOOD PEEL AWAY T7 (MISCELLANEOUS) ×8 IMPLANT
INSERT TIB BEARING E 28X42 (Insert) IMPLANT
KIT DRSG PREVENA PLUS 7DAY 125 (MISCELLANEOUS) IMPLANT
KIT TURNOVER KIT A (KITS) ×2 IMPLANT
MARKER SKIN DUAL TIP RULER LAB (MISCELLANEOUS) IMPLANT
NDL SPNL 18GX3.5 QUINCKE PK (NEEDLE) ×2 IMPLANT
NEEDLE SPNL 18GX3.5 QUINCKE PK (NEEDLE) ×1 IMPLANT
PACK ANTERIOR HIP CUSTOM (KITS) ×2 IMPLANT
PENCIL SMOKE EVACUATOR (MISCELLANEOUS) ×2 IMPLANT
SAW OSC TIP CART 19.5X105X1.3 (SAW) ×2 IMPLANT
SEALER BIPOLAR AQUA 6.0 (INSTRUMENTS) ×2 IMPLANT
SET HNDPC FAN SPRY TIP SCT (DISPOSABLE) IMPLANT
SOLUTION PRONTOSAN WOUND 350ML (IRRIGATION / IRRIGATOR) ×2 IMPLANT
STAPLER SKIN PROX 35W (STAPLE) IMPLANT
SUT ETHIBOND NAB CT1 #1 30IN (SUTURE) ×4 IMPLANT
SUT ETHILON 2 0 PSLX (SUTURE) IMPLANT
SUT MNCRL AB 3-0 PS2 18 (SUTURE) ×2 IMPLANT
SUT MNCRL AB 4-0 PS2 18 (SUTURE) ×2 IMPLANT
SUT MON AB 2-0 CT1 36 (SUTURE) ×4 IMPLANT
SUT NYLON 3 0 (SUTURE) IMPLANT
SUT STRATAFIX 14 PDO 48 VLT (SUTURE) ×2 IMPLANT
SUT VIC AB 2-0 CT1 TAPERPNT 27 (SUTURE) ×4 IMPLANT
SYR 50ML LL SCALE MARK (SYRINGE) ×2 IMPLANT
TOWEL GREEN STERILE FF (TOWEL DISPOSABLE) ×2 IMPLANT
TRAY FOLEY MTR SLVR 16FR STAT (SET/KITS/TRAYS/PACK) ×2 IMPLANT
TUBE SUCTION HIGH CAP CLEAR NV (SUCTIONS) IMPLANT
WATER STERILE IRR 1000ML POUR (IV SOLUTION) ×2 IMPLANT

## 2024-01-21 NOTE — Progress Notes (Signed)
 [facility] will be in 10 min. Rt ear, hearing aid in, lt. Hearing aid in the bottle, will send with Pt. Also prevena suction will send with pt.

## 2024-01-21 NOTE — Transfer of Care (Signed)
 Immediate Anesthesia Transfer of Care Note  Patient: Sherri [name]  A Rutan  Procedure(s) Performed: REVISION, TOTAL ARTHROPLASTY, HIP, ANTERIOR APPROACH (Right: Hip)  Patient Location: PACU  Anesthesia Type:General  Level of Consciousness: awake and patient cooperative  Airway & Oxygen Therapy: Patient Spontanous Breathing and Patient connected to face mask oxygen  Post-op Assessment: Report given to RN and Post -op Vital signs reviewed and stable  Post vital signs: Reviewed and stable  Last Vitals:  Vitals Value Taken Time  BP 120/62 01/21/24 19:15  Temp 36.9 C 01/21/24 19:12  Pulse 101 01/21/24 19:18  Resp 13 01/21/24 19:18  SpO2 100 % 01/21/24 19:18  Vitals shown include unfiled device data.  Last Pain:  Vitals:   01/21/24 1448  TempSrc:   PainSc: 10-Worst pain ever         Complications: No notable events documented.

## 2024-01-21 NOTE — Anesthesia Procedure Notes (Signed)
 Procedure Name: Intubation Date/Time: 01/21/2024 5:03 PM  Performed by: Gladis Honey, CRNAPre-anesthesia Checklist: Patient identified, Emergency Drugs available, Suction available and Patient being monitored Patient Re-evaluated:Patient Re-evaluated prior to induction Oxygen Delivery Method: Circle System Utilized Preoxygenation: Pre-oxygenation with 100% oxygen Induction Type: IV induction Ventilation: Mask ventilation without difficulty Laryngoscope Size: Miller and 2 Grade View: Grade I Tube type: Oral Tube size: 7.0 mm Number of attempts: 1 Airway Equipment and Method: Stylet and Oral airway Placement Confirmation: ETT inserted through vocal cords under direct vision, positive ETCO2 and breath sounds checked- equal and bilateral Secured at: 22 cm Tube secured with: Tape Dental Injury: Teeth and Oropharynx as per pre-operative assessment

## 2024-01-21 NOTE — Progress Notes (Signed)
 [facility] transport set up to bring Patient over to [facility] for surgery at 12 today.

## 2024-01-21 NOTE — Discharge Instructions (Signed)
 Dr. Redell Shoals Joint Replacement Specialist [facility] [address] [place], [place] [address] [phone]   TOTAL HIP REPLACEMENT POSTOPERATIVE DIRECTIONS    Hip Rehabilitation, Guidelines Following Surgery   WEIGHT BEARING Weight bearing as tolerated with assist device (walker, cane, etc) as directed, use it as long as suggested by your surgeon or therapist, typically at least 4-6 weeks.  The results of a hip operation are greatly improved after range of motion and muscle strengthening exercises. Follow all safety measures which are given to protect your hip. If any of these exercises cause increased pain or swelling in your joint, decrease the amount until you are comfortable again. Then slowly increase the exercises. Call your caregiver if you have problems or questions.   HOME CARE INSTRUCTIONS  Most of the following instructions are designed to prevent the dislocation of your new hip.  Remove items at home which could result in a fall. This includes throw rugs or furniture in walking pathways.  Continue medications as instructed at time of discharge. You may have some home medications which will be placed on hold until you complete the course of blood thinner medication. Keep dressing clean and dry. Do not remove your dressing. Do not put on socks or shoes without following the instructions of your caregivers.   Sit on chairs with arms. Use the chair arms to help push yourself up when arising.  Arrange for the use of a toilet seat elevator so you are not sitting low.  Walk with walker as instructed.  You may resume a sexual relationship in one month or when given the OK by your caregiver.  Use walker as long as suggested by your caregivers.  You may put full weight on your legs and walk as much as is comfortable. Avoid periods of inactivity such as sitting longer than an hour when not asleep. This helps prevent blood clots.  You may return to  work once you are cleared by Designer, industrial/product.  Do not drive a car for 6 weeks or until released by your surgeon.  Do not drive while taking narcotics.  Wear elastic stockings for two weeks following surgery during the day but you may remove then at night.  Make sure you keep all of your appointments after your operation with all of your doctors and caregivers. You should call the office at the above phone number and make an appointment for approximately two weeks after the date of your surgery. Please pick up a stool softener and laxative for home use as long as you are requiring pain medications. ICE to the affected hip every three hours for 30 minutes at a time and then as needed for pain and swelling. Continue to use ice on the hip for pain and swelling from surgery. You may notice swelling that will progress down to the foot and ankle.  This is normal after surgery.  Elevate the leg when you are not up walking on it.   It is important for you to complete the blood thinner medication as prescribed by your doctor. Continue to use the breathing machine which will help keep your temperature down.  It is common for your temperature to cycle up and down following surgery, especially at night when you are not up moving around and exerting yourself.  The breathing machine keeps your lungs expanded and your temperature down.  RANGE OF MOTION AND STRENGTHENING EXERCISES  These exercises are designed to help you keep full movement of  your hip joint. Follow your caregiver's or physical therapist's instructions. Perform all exercises about fifteen times, three times per day or as directed. Exercise both hips, even if you have had only one joint replacement. These exercises can be done on a training (exercise) mat, on the floor, on a table or on a bed. Use whatever works the best and is most comfortable for you. Use music or television while you are exercising so that the exercises are a pleasant break in your day.  This will make your life better with the exercises acting as a break in routine you can look forward to.  Lying on your back, slowly slide your foot toward your buttocks, raising your knee up off the floor. Then slowly slide your foot back down until your leg is straight again.  Lying on your back spread your legs as far apart as you can without causing discomfort.  Lying on your side, raise your upper leg and foot straight up from the floor as far as is comfortable. Slowly lower the leg and repeat.  Lying on your back, tighten up the muscle in the front of your thigh (quadriceps muscles). You can do this by keeping your leg straight and trying to raise your heel off the floor. This helps strengthen the largest muscle supporting your knee.  Lying on your back, tighten up the muscles of your buttocks both with the legs straight and with the knee bent at a comfortable angle while keeping your heel on the floor.   SKILLED REHAB INSTRUCTIONS: If the patient is transferred to a skilled rehab facility following release from the hospital, a list of the current medications will be sent to the facility for the patient to continue.  When discharged from the skilled rehab facility, please have the facility set up the patient's Home Health Physical Therapy prior to being released. Also, the skilled facility will be responsible for providing the patient with their medications at time of release from the facility to include their pain medication and their blood thinner medication. If the patient is still at the rehab facility at time of the two week follow up appointment, the skilled rehab facility will also need to assist the patient in arranging follow up appointment in our office and any transportation needs.  POST-OPERATIVE OPIOID TAPER INSTRUCTIONS: It is important to wean off of your opioid medication as soon as possible. If you do not need pain medication after your surgery it is ok to stop day one. Opioids  include: Codeine, Hydrocodone (Norco, Vicodin), Oxycodone (Percocet, oxycontin ) and hydromorphone  amongst others.  Long term and even short term use of opiods can cause: Increased pain response Dependence Constipation Depression Respiratory depression And more.  Withdrawal symptoms can include Flu like symptoms Nausea, vomiting And more Techniques to manage these symptoms Hydrate well Eat regular healthy meals Stay active Use relaxation techniques(deep breathing, meditating, yoga) Do Not substitute Alcohol  to help with tapering If you have been on opioids for less than two weeks and do not have pain than it is ok to stop all together.  Plan to wean off of opioids This plan should start within one week post op of your joint replacement. Maintain the same interval or time between taking each dose and first decrease the dose.  Cut the total daily intake of opioids by one tablet each day Next start to increase the time between doses. The last dose that should be eliminated is the evening dose.    MAKE SURE YOU:  Understand  these instructions.  Will watch your condition.  Will get help right away if you are not doing well or get worse.  Pick up stool softner and laxative for home use following surgery while on pain medications. Do not remove your dressing. Continue to use ice for pain and swelling after surgery. Do not use any lotions or creams on the incision until instructed by your surgeon. Posterior hip precautions.  Wear hip abduction brace.  Please keep incision clean and dry. Do not remove dressing.  Please charge Prevena Negative pressure incisional dressing daily.  Follow-up within 7 days of placement of portable Prevena wound vac as it only lasts for 7 days after starting.   Please keep a log of JP drain bulb output and bring to your first appointment. Please empty JP drain bulb every 4 hours and record output, then recharge drain bulb.

## 2024-01-21 NOTE — Progress Notes (Signed)
 PROGRESS NOTE  Sherri  [name]:[id number] DOB: May 15, 1941 DOA: 01/18/2024 PCP: [name] PARAS, MD   LOS: 1 day   Brief Narrative / Interim history: 83 year old female with RA, MS, chronic pain etiology closely right hip pain.  Was found to have persistent hip dislocation, orthopedic surgery was consulted and she was admitted to the hospital  Subjective / 24h Interval events: Doing overall well this morning, just woke up.  Awaiting surgery  Assesement and Plan: Principal Problem:   Hip dislocation, right (HCC) Active Problems:   Hyponatremia   Anemia of chronic disease   Rheumatoid arthritis (HCC)   Hyperlipidemia   Chronic pain   Multiple sclerosis (HCC)   GERD (gastroesophageal reflux disease)  Principal problem Right hip dislocation-orthopedic surgery consulted, looks like she is scheduled for OR today at [facility].  She will be transported over there and back.  Continue pain control meanwhile, PT/OT postoperatively  Active problems Chronic hyponatremia -continue salt tabs, sodium overall stable  Chronic disease-hemoglobin appears at baseline  RA-continue methotrexate   Anemia of chronic disease-no bleeding  Hypokalemia-potassium replaced.  Magnesium  normal.  Recheck in the morning  Hyperlipidemia-continue statin  Chronic pain-continue Cymbalta   History of MS -not on active treatment  Scheduled Meds:  atorvastatin   10 mg Oral QPM   docusate sodium   100 mg Oral Daily   DULoxetine   20 mg Oral QHS   enoxaparin  (LOVENOX ) injection  30 mg Subcutaneous Q24H   folic acid   1 mg Oral Daily   methotrexate   12.5 mg Oral Q Wed   pantoprazole   40 mg Oral BID AC   polyethylene glycol  17 g Oral Daily   potassium chloride  SA  10 mEq Oral QODAY   predniSONE   5 mg Oral Q breakfast   senna  1 tablet Oral QHS   sodium chloride  flush  3 mL Intravenous Q12H   sodium chloride   1 g Oral BID WC   tamsulosin   0.4 mg Oral QPC supper   Continuous Infusions: PRN  Meds:.acetaminophen  **OR** acetaminophen , albuterol , bisacodyl , cyclobenzaprine , diazepam , hydrALAZINE , HYDROmorphone  (DILAUDID ) injection, ondansetron  **OR** ondansetron  (ZOFRAN ) IV, oxyCODONE   Current Outpatient Medications  Medication Instructions   acetaminophen  (TYLENOL ) 650 mg, Oral, Every 6 hours PRN   atorvastatin  (LIPITOR) 10 mg, Oral, Every evening   Biotin  10,000 mcg, Oral, Every morning   bisacodyl  (DULCOLAX) 10 mg, Rectal, Daily PRN   Carboxymethylcellul-Glycerin  (LUBRICATING EYE DROPS OP) 1 drop, Both Eyes, Daily PRN   cholecalciferol  (VITAMIN D3) 1,000 Units, Oral, Every morning   cyclobenzaprine  (FLEXERIL ) 10 MG tablet TAKE 1 TABLET BY MOUTH THREE TIMES A DAY AS NEEDED FOR MUSCLE SPASM   diazepam  (VALIUM ) 5 MG tablet TAKE 1 TABLET BY MOUTH AT BEDTIME AS NEEDED FOR MUSCLE SPASMS.   docusate sodium  (COLACE) 100 mg, Oral, Daily   DULoxetine  (CYMBALTA ) 20 mg, Oral, Daily at bedtime   folic acid  (FOLVITE ) 1 mg, Oral, Daily   furosemide  (LASIX ) 10 mg, Oral, Daily   magic mouthwash (nystatin , lidocaine , diphenhydrAMINE , alum & mag hydroxide) suspension 5 mLs, Swish & Spit, 3 times daily PRN   methotrexate  (RHEUMATREX) 12.5 mg, Oral, Weekly, Caution:Chemotherapy. Protect from light.   Nystatin  (GERHARDT'S BUTT CREAM) CREA 1 Application, Topical, 3 times daily   oxyCODONE  (ROXICODONE ) 5 mg, Oral, Every 4 hours PRN   pantoprazole  (PROTONIX ) 40 mg, Oral, 2 times daily before meals   polyethylene glycol powder (GLYCOLAX /MIRALAX ) 17 g, Oral, 2 times daily   potassium chloride  SA (KLOR-CON  M) 20 MEQ tablet 10 mEq, Oral, Every other  day   predniSONE  (DELTASONE ) 5 MG tablet TAKE 1 TABLET BY MOUTH EVERY DAY IN THE MORNING   Prolia  60 mg, Subcutaneous, Every 6 months, Deliver to rheum: [address], [address], [place], [place] [address]. Appt on 07/23/2022   [Paused] [name]  15 mg, Daily   senna (SENOKOT) 8.6 MG tablet 1 tablet, Oral, Daily at bedtime   sodium chloride  1 g, Oral, 2 times daily  with meals   tamsulosin  (FLOMAX ) 0.4 mg, Oral, Daily after supper   traMADol  (ULTRAM ) 25-50 mg, Oral, Every 8 hours PRN   vitamin C  1,000 mg, Oral, Daily   Zinc  Sulfate 220 mg, Oral, Daily with supper    Diet Orders (From admission, onward)     Start     Ordered   01/21/24 0001  Diet NPO time specified  Diet effective midnight        01/20/24 2242            DVT prophylaxis: enoxaparin  (LOVENOX ) injection 30 mg Start: 01/19/24 0815   Lab Results  Component Value Date   PLT 393 01/20/2024      Code Status: Full Code  Family Communication: No family at bedside  Status is: Inpatient   Level of care: Telemetry Medical  Consultants:  Orthopedic surgery  Objective: Vitals:   01/20/24 1427 01/20/24 2000 01/21/24 0355 01/21/24 0731  BP: (!) 113/56 134/69 (!) 140/64 (!) 147/74  Pulse: (!) 106 98 (!) 110 (!) 114  Resp: 16 18    Temp: 98.3 F (36.8 C) 97.9 F (36.6 C) 98.6 F (37 C) 97.8 F (36.6 C)  TempSrc:   Oral Oral  SpO2: 97% 100% 95% 90%  Weight:      Height:        Intake/Output Summary (Last 24 hours) at 01/21/2024 9070 Last data filed at 01/21/2024 0000 Gross per 24 hour  Intake 4 ml  Output --  Net 4 ml   Wt Readings from Last 3 Encounters:  01/18/24 47 kg  12/22/23 47.6 kg  12/03/23 48.6 kg    Examination:  Constitutional: NAD Eyes: lids and conjunctivae normal, no scleral icterus ENMT: mmm Neck: normal, supple Respiratory: clear to auscultation bilaterally, no wheezing, no crackles.  Cardiovascular: Regular rate and rhythm, no murmurs / rubs / gallops. Abdomen: soft, no distention, no tenderness. Bowel sounds positive.   Data Reviewed: I have independently reviewed following labs and imaging studies   CBC Recent Labs  Lab 01/18/24 2239 01/20/24 0521  WBC 8.0 10.2  HGB 9.0* 10.0*  HCT 28.3* 32.0*  PLT 370 393  MCV 97.6 97.6  MCH 31.0 30.5  MCHC 31.8 31.3  RDW 18.6* 18.6*    Recent Labs  Lab 01/18/24 2239 01/20/24 0521   NA 131* 132*  K 3.5 3.3*  CL 97* 96*  CO2 26 25  GLUCOSE 116* 78  BUN 6* 6*  CREATININE 0.80 0.46  CALCIUM  8.1* 8.2*  MG  --  1.9    ------------------------------------------------------------------------------------------------------------------ No results for input(s): CHOL, HDL, LDLCALC, TRIG, CHOLHDL, LDLDIRECT in the last 72 hours.  Lab Results  Component Value Date   HGBA1C 6.0 (H) 08/06/2023   ------------------------------------------------------------------------------------------------------------------ No results for input(s): TSH, T4TOTAL, T3FREE, THYROIDAB in the last 72 hours.  Invalid input(s): FREET3  Cardiac Enzymes No results for input(s): CKMB, TROPONINI, MYOGLOBIN in the last 168 hours.  Invalid input(s): CK ------------------------------------------------------------------------------------------------------------------    Component Value Date/Time   BNP 137 (H) 12/09/2019 1427    CBG: No results for input(s): GLUCAP  in the last 168 hours.  No results found for this or any previous visit (from the past 240 hours).   Radiology Studies: No results found.   Nilda Fendt, MD, PhD [facility]  Between 7 am - 7 pm I am available, please contact me via Amion (for emergencies) or Securechat (non urgent messages)  Between 7 pm - 7 am I am not available, please contact night coverage MD/APP via Amion

## 2024-01-21 NOTE — Anesthesia Preprocedure Evaluation (Addendum)
 Anesthesia Evaluation  Patient identified by MRN, date of birth, ID band Patient awake    Reviewed: Allergy & Precautions, NPO status , Patient's Chart, lab work & pertinent test results  History of Anesthesia Complications (+) PONV and history of anesthetic complications  Airway Mallampati: I  TM Distance: >3 FB Neck ROM: Full    Dental  (+) Teeth Intact, Dental Advisory Given   Pulmonary former smoker   breath sounds clear to auscultation       Cardiovascular hypertension, Pt. on medications + Peripheral Vascular Disease   Rhythm:Regular Rate:Normal  Echo:  - Left ventricle: The cavity size was normal. Systolic function was    normal. The estimated ejection fraction was in the range of 60%    to 65%. Wall motion was normal; there were no regional wall    motion abnormalities. Left ventricular diastolic function    parameters were normal.  - Aortic valve: Valve area (VTI): 2 cm^2. Valve area (Vmax): 2.17    cm^2. Valve area (Vmean): 1.86 cm^2.  - Mitral valve: There was mild regurgitation.  - Left atrium: The atrium was mildly dilated.  - Atrial septum: There was increased thickness of the septum,    consistent with lipomatous hypertrophy. No defect or patent    foramen ovale was identified.      Neuro/Psych  Headaches  Anxiety      Neuromuscular disease    GI/Hepatic Neg liver ROS,GERD  Medicated,,  Endo/Other  negative endocrine ROS    Renal/GU negative Renal ROS     Musculoskeletal  (+) Arthritis ,    Abdominal   Peds  Hematology  (+) Blood dyscrasia, anemia   Anesthesia Other Findings   Reproductive/Obstetrics                              Anesthesia Physical Anesthesia Plan  ASA: 3  Anesthesia Plan: General   Post-op Pain Management: Ofirmev  IV (intra-op)*   Induction: Intravenous  PONV Risk Score and Plan: 4 or greater and Ondansetron  and Treatment may vary due to  age or medical condition  Airway Management Planned: Oral ETT  Additional Equipment: None  Intra-op Plan:   Post-operative Plan: Extubation in OR  Informed Consent: I have reviewed the patients History and Physical, chart, labs and discussed the procedure including the risks, benefits and alternatives for the proposed anesthesia with the patient or authorized representative who has indicated his/her understanding and acceptance.     Dental advisory given and Consent reviewed with POA  Plan Discussed with: CRNA  Anesthesia Plan Comments: (Called daughter for consent. )         Anesthesia Quick Evaluation

## 2024-01-21 NOTE — Op Note (Signed)
 OPERATIVE REPORT   01/21/2024  6:57 PM  PATIENT:  Sherri  [name]   SURGEON:  Redell JINNY Shoals, MD  ASSISTANT:  Valery Potters, PA-C.   PREOPERATIVE DIAGNOSIS:  Periprosthetic right hip dislocation.   POSTOPERATIVE DIAGNOSIS:  Same.   PROCEDURE:  Revision right total hip arthroplasty, head ball and liner exchange.   ANESTHESIA:   GETA.   ANTIBIOTICS: 2 g Ancef . 1 g vancomycin  (history of MRSA infection).   EXPLANTS: Biomet G7 dual mobility acetabular liner 42 mm, E, neutral. Dual mobility Vivacit-E outer bearing 28 by 42 mm. Metal head ball 28-3 mm.   IMPLANTS: Dual mobility Vivacit-E outer bearing 28 by 42 mm. Metal head ball 28+0 mm.   SPECIMENS:  Superficial fluid culture swab right hip for aerobic and anaerobic culture.   TUBES AND DRAINS: 1.  10 mm flat JP drain in the subcutaneous tissue. 2.  Prevena negative pressure incisional dressing at 75 mmHg.  DISPOSITION:  Stable to PACU.  SURGICAL INDICATIONS:  Sherri  [name] is a 83 y.o. female with multiple right hip surgeries following primary total hip arthroplasty for chronic femoral neck fracture 2 months ago.  She was admitted for right hip dislocation.  She was indicated for open reduction, head ball exchange of the right hip.   The risks, benefits, and alternatives were discussed with the patient. There are risks associated with the surgery including, but not limited to, problems with anesthesia (death), infection, instability (giving out of the joint), dislocation, differences in leg length/angulation/rotation, fracture of bones, loosening or failure of implants, hematoma (blood accumulation) which may require surgical drainage, blood clots, pulmonary embolism, nerve injury (foot drop and lateral thigh numbness), and blood vessel injury. The patient understands these risks and elects to proceed.  PROCEDURE IN DETAIL: The patient was identified in the holding areas and 2 identifiers.  The surgical site was marked  by myself.  She was taken to the operating room, and general anesthesia was obtained.  She was then transferred to the [facility] table.  All bony prominences were well-padded.  The right hip was prepped and draped in the normal sterile surgical fashion.  Timeout was called, verifying side and site of surgery.  She did receive IV antibiotics and 60 minutes beginning the procedure.   Using a #10 blade, I sharply excised her previous bikini skin incision.  I sent routine culture swabs  of the subcutaneous fluid collection for aerobic and anaerobic culture.  Her underlying fascial repair was intact.  The superficial layer was copiously irrigated with 3 L of normal saline.  I then removed her fascial sutures to obtain access to the hip joint.  The joint contained appropriate appearing serosanguineous fluid without any purulence or evidence of infection.  I identified her prosthesis.  She had posterior dislocation.  Using gentle pressure with a bone hook, traction, and rotation, I was able to convert her dislocation to an anterior dislocation.    Using a bone tamp, I was able to remove her head ball without any difficulty.  The trunnion was retracted posteriorly, and acetabular retractors were placed.  I attempted to remove the metal acetabular liner without success.  I then elected to place a new dual mobility bearing.   The new dual mobility bearing was impacted onto the trunnion.  The hip was reduced.  Soft tissue tension was excellent.  At 90 degrees of external rotation and full extension, the hip was stable to an anterior directed force with a bone hook.  The lower extremity  was brought back into a neutral position, and her boot was removed from the [place] table.  I then performed posterior stability testing in body position and in a position of neutral abduction and 90 degrees of flexion.  I was able to internally rotate her to 90 degrees without any impingement or instability.   The hip was then irrigated with  Prontosan solution and 3 L of normal saline using pulsatile lavage.  1 g of vancomycin  powder was placed into the hip joint.  The fascia was repaired with #1 strata fix suture.  A 10 mm perforated JP drain was placed into the subcutaneous tissue and brought out through a stab incision over the lateral thigh.  The drain was sewn in with 3-0 nylon suture.  The deep dermal layer was reapproximated with 2-0 Monocryl suture.  The skin was reapproximated with 2-0 nylon vertical mattress sutures.  Customizable Prevena negative pressure dressing was applied and hooked up to suction at 75 mmHg.  There was excellent seal without any leak.  Dressing was placed over the drain site.  The patient was then transferred to her bed, extubated, and taken to the PACU in stable condition.  Sponge, needle, instrument counts were correct in case x 2.  There were no known complications.   POSTOPERATIVE PLAN: Postoperatively, the patient be readmitted to the hospitalist service at [facility]  She may weight-bear as tolerated right lower extremity.  Posterior hip precautions.  We are holding chemical DVT prophylaxis due to anemia.  Mobilize out of bed with PT.  Follow IntraOp culture.  We will discontinue the JP drain when output is less than 10 cc per shift.  Continue negative pressure dressing at 75 mmHg.  Upon discharge, the house VAC suction unit will need to be exchanged for a portable Prevena suction unit.  She will need to follow-up in the office within 7 days of discharge for removal of her negative pressure dressing.

## 2024-01-21 NOTE — Interval H&P Note (Signed)
 History and Physical Interval Note:  01/21/2024 2:59 PM  Sherri [name]  has presented today for surgery, with the diagnosis of Right dislocated total hip.  The various methods of treatment have been discussed with the patient and family. After consideration of risks, benefits and other options for treatment, the patient has consented to  Procedure(s): REVISION, TOTAL ARTHROPLASTY, HIP, ANTERIOR APPROACH (Right) as a surgical intervention.  The patient's history has been reviewed, patient examined, no change in status, stable for surgery.  I have reviewed the patient's chart and labs.  Questions were answered to the patient's satisfaction.    Discussed R/B/A with the patient's daughter, [name]

## 2024-01-21 NOTE — Anesthesia Postprocedure Evaluation (Signed)
 Anesthesia Post Note  Patient: Sherri  [name]  Procedure(s) Performed: REVISION, TOTAL ARTHROPLASTY, HIP, ANTERIOR APPROACH (Right: Hip)     Patient location during evaluation: PACU Anesthesia Type: General Level of consciousness: awake Pain management: pain level controlled Vital Signs Assessment: post-procedure vital signs reviewed and stable Respiratory status: spontaneous breathing, nonlabored ventilation and respiratory function stable Cardiovascular status: blood pressure returned to baseline and stable Postop Assessment: no apparent nausea or vomiting Anesthetic complications: no   No notable events documented.  Last Vitals:  Vitals:   01/21/24 1912 01/21/24 1915  BP:  120/62  Pulse:  97  Resp:  14  Temp: 36.9 C   SpO2:  100%    Last Pain:  Vitals:   01/21/24 1448  TempSrc:   PainSc: 10-Worst pain ever                 Delon Aisha Arch

## 2024-01-22 ENCOUNTER — Ambulatory Visit: Admitting: Internal Medicine

## 2024-01-22 ENCOUNTER — Encounter (HOSPITAL_COMMUNITY): Payer: Self-pay | Admitting: Orthopedic Surgery

## 2024-01-22 DIAGNOSIS — S73004D Unspecified dislocation of right hip, subsequent encounter: Secondary | ICD-10-CM | POA: Diagnosis not present

## 2024-01-22 LAB — COMPREHENSIVE METABOLIC PANEL WITH GFR
ALT: 8 U/L (ref 0–44)
AST: 23 U/L (ref 15–41)
Albumin: 1.9 g/dL — ABNORMAL LOW (ref 3.5–5.0)
Alkaline Phosphatase: 47 U/L (ref 38–126)
Anion gap: 12 (ref 5–15)
BUN: 7 mg/dL — ABNORMAL LOW (ref 8–23)
CO2: 26 mmol/L (ref 22–32)
Calcium: 7.9 mg/dL — ABNORMAL LOW (ref 8.9–10.3)
Chloride: 93 mmol/L — ABNORMAL LOW (ref 98–111)
Creatinine, Ser: 0.52 mg/dL (ref 0.44–1.00)
GFR, Estimated: >60 mL/min (ref >60)
Glucose, Bld: 96 mg/dL (ref 70–99)
Potassium: 3.8 mmol/L (ref 3.5–5.1)
Sodium: 131 mmol/L — ABNORMAL LOW (ref 135–145)
Total Bilirubin: 0.6 mg/dL (ref 0.0–1.2)
Total Protein: 4.1 g/dL — ABNORMAL LOW (ref 6.5–8.1)

## 2024-01-22 LAB — CBC
HCT: 22.3 % — ABNORMAL LOW (ref 36.0–46.0)
Hemoglobin: 7.1 g/dL — ABNORMAL LOW (ref 12.0–15.0)
MCH: 30.6 pg (ref 26.0–34.0)
MCHC: 31.8 g/dL (ref 30.0–36.0)
MCV: 96.1 fL (ref 80.0–100.0)
Platelets: 307 K/uL (ref 150–400)
RBC: 2.32 MIL/uL — ABNORMAL LOW (ref 3.87–5.11)
RDW: 17.9 % — ABNORMAL HIGH (ref 11.5–15.5)
WBC: 10.3 K/uL (ref 4.0–10.5)
nRBC: 0 % (ref 0.0–0.2)

## 2024-01-22 LAB — PREPARE RBC (CROSSMATCH)

## 2024-01-22 LAB — MAGNESIUM: Magnesium: 1.5 mg/dL — ABNORMAL LOW (ref 1.7–2.4)

## 2024-01-22 MED ORDER — MAGNESIUM SULFATE 2 GM/50ML IV SOLN
2.0000 g | Freq: Once | INTRAVENOUS | Status: AC
  Administered 2024-01-22: 2 g via INTRAVENOUS
  Filled 2024-01-22: qty 50

## 2024-01-22 MED ORDER — SODIUM CHLORIDE 0.9% IV SOLUTION: Freq: Once | INTRAVENOUS | Status: AC

## 2024-01-22 NOTE — Progress Notes (Signed)
 Subjective:  Patient reports pain as mild to moderate.  Denies N/V/CP/SOB/Abd pain. She reports pain in her hip.  Sitter at bedside.  She denies tingling or numbness in LE bilaterally.  Voiding with purewick   Objective:   VITALS:   Vitals:   01/22/24 0734 01/22/24 1100 01/22/24 1232 01/22/24 1240  BP: (!) 119/56 (!) 109/55 (!) 95/54 107/67  Pulse: (!) 111 (!) 108 (!) 111 (!) 115  Resp: 16  16   Temp: 98.2 F (36.8 C) 98.7 F (37.1 C) 98.6 F (37 C) 98.7 F (37.1 C)  TempSrc: Oral Oral Oral Axillary  SpO2: 96%  99%   Weight:      Height:        NAD Neurologically intact ABD soft Neurovascular intact Sensation intact distally Intact pulses distally Dorsiflexion/Plantar flexion intact No cellulitis present Compartment soft JP drain output 30 cc SS fluid. Dressing C/D/I. Continue JP drain.  Prevena negative pressure dressing no leaks detected. Continue. Dressing C/D/I.   Lab Results  Component Value Date   WBC 10.3 01/22/2024   HGB 7.1 (L) 01/22/2024   HCT 22.3 (L) 01/22/2024   MCV 96.1 01/22/2024   PLT 307 01/22/2024   BMET    Component Value Date/Time   NA 131 (L) 01/22/2024 0903   NA 139 06/20/2019 1335   NA 136 02/22/2015 0000   K 3.8 01/22/2024 0903   K 4.4 02/22/2015 0000   CL 93 (L) 01/22/2024 0903   CL 98 02/22/2015 0000   CO2 26 01/22/2024 0903   CO2 30 02/22/2015 0000   GLUCOSE 96 01/22/2024 0903   BUN 7 (L) 01/22/2024 0903   BUN 10 06/20/2019 1335   CREATININE 0.52 01/22/2024 0903   CREATININE 1.03 (H) 08/06/2023 1510   CALCIUM  7.9 (L) 01/22/2024 0903   CALCIUM  9.6 02/22/2015 0000   EGFR 55 (L) 08/06/2023 1510   GFRNONAA >60 01/22/2024 0903   GFRNONAA 56 (L) 09/26/2020 1536   Results for orders placed or performed during the hospital encounter of 01/18/24  Surgical pcr screen     Status: None   Collection Time: 01/21/24  1:26 PM   Specimen: Nasal Mucosa; Nasal Swab  Result Value Ref Range Status   MRSA, PCR NEGATIVE  NEGATIVE Final   Staphylococcus aureus NEGATIVE NEGATIVE Final    Comment: (NOTE) The Xpert SA Assay (FDA approved for NASAL specimens in patients 37 years of age and older), is one component of a comprehensive surveillance program. It is not intended to diagnose infection nor to guide or monitor treatment. Performed at [facility], [address] [place], [place] [address]   Aerobic/Anaerobic Culture w Gram Stain (surgical/deep wound)     Status: None (Preliminary result)   Collection Time: 01/21/24  5:03 PM   Specimen: Synovial, Right Hip; Body Fluid  Result Value Ref Range Status   Specimen Description   Final    SYNOVIAL Performed at [facility], [address] [place], [place] [address]    Special Requests SUPERFICIAL RIGHT HIP  Final   Gram Stain NO WBC SEEN NO ORGANISMS SEEN   Final   Culture   Final    NO GROWTH < 12 HOURS Performed at [facility], [address] [place], [place] [address]    Report Status PENDING  Incomplete   *Note: Due to a large number of results and/or encounters for the requested time period, some results have not been displayed. A complete set  of results can be found in Results Review.     Assessment/Plan: 1 Day Post-Op   Principal Problem:   Hip dislocation, right (HCC) Active Problems:   Hyperlipidemia   Multiple sclerosis (HCC)   Rheumatoid arthritis (HCC)   GERD (gastroesophageal reflux disease)   Hyponatremia   Anemia of chronic disease   Chronic pain  ABLA. Hemoglobin 7.1 Continue to monitor.   WBAT with walker,  Posterior hip precautions. Hip abduction brace at all times.  DVT ppx: None due to anemia, SCDs, TEDS PO pain control PT/OT: Mobilize out of bed with PT Dispo:  - patient under care of the medical team disposition per their recommendation.  -  We are holding chemical DVT prophylaxis due to anemia.  - Follow IntraOp culture. Gram stain negative. Will continue to  follow.  - We will discontinue the JP drain when output is less than 10 cc per shift.  - Continue negative pressure dressing at 75 mmHg. Upon discharge, the house VAC suction unit will need to be exchanged for a portable Prevena suction unit. She will need to follow-up in the office within 7 days of discharge for removal of her negative pressure dressing.    Sherri [name] Potters 01/22/2024, 3:27 PM   [facility]  [facility] [address], [address], [place], [place] [address] Phone: [phone] [email or web address] Facebook  [employer]

## 2024-01-22 NOTE — Progress Notes (Signed)
 Orthopedic Tech Progress Note Patient Details:  Sherri  [name] [birth year] [id number] Called in order for hip abduction brace Patient ID: Sherri [name], female   DOB: 1940/12/24, 83 y.o.   MRN: [id number]  Sherri [name] 01/22/2024, 9:30 AM

## 2024-01-22 NOTE — Progress Notes (Signed)
 PT Cancellation Note  Patient Details Name: Sherri [name]  [name] MRN: [id number] DOB: [birth year]   Cancelled Treatment:    Reason Eval/Treat Not Completed: Other (comment) (PT consult appreciated and chart reviewed. Pt pending [place] hip abduction brace; ordered, but not present in room. Awaiting clarification on RUE weight bearing status d/t prior right elbow injury. Will follow-up for evaluation as schedule permits.)  Randall SAUNDERS, PT, DPT Acute Rehabilitation Services Office: [phone] Secure Chat Preferred  [name] 01/22/2024, 9:27 AM

## 2024-01-22 NOTE — Progress Notes (Signed)
 PT Cancellation Note  Patient Details Name: Sherri [name] MRN: [id number] DOB: 03/04/1941   Cancelled Treatment:    Reason Eval/Treat Not Completed: Medical issues which prohibited therapy;Fatigue/lethargy limiting ability to participate (Pt's Hgb is 7.1. Per RN, she is symptomatic with hypotension. Pending 1 unit of pRBCs. [place] hip abduction brace present in room on counter. Caregiver is unsure if it has been fit to pt. Pt sleeping and did not arouse to verbal command.) Will follow-up for PT evaluation as schedule permits.   Randall SAUNDERS, PT, DPT Acute Rehabilitation Services Office: [phone] Secure Chat Preferred  Sherri [name] 01/22/2024, 2:51 PM

## 2024-01-22 NOTE — Progress Notes (Signed)
 PROGRESS NOTE  Sherri [name]  A [place] FMW:[id number] DOB: Mar 09, 1941 DOA: 01/18/2024 PCP: [name] PARAS, MD   LOS: 2 days   Brief Narrative / Interim history: 83 year old female with RA, MS, chronic pain etiology closely right hip pain.  Was found to have persistent hip dislocation, orthopedic surgery was consulted and she was admitted to the hospital  Subjective / 24h Interval events: Sleepy this morning, got some pain medications early today   Assesement and Plan: Principal Problem:   Hip dislocation, right (HCC) Active Problems:   Hyponatremia   Anemia of chronic disease   Rheumatoid arthritis (HCC)   Hyperlipidemia   Chronic pain   Multiple sclerosis (HCC)   GERD (gastroesophageal reflux disease)  Principal problem Right hip dislocation-orthopedic surgery consulted, she was taken to the OR on 8/28 by Dr. Fidel and is status post right total hip revision, head ball and liner exchange - Seems to be quite sleepy this morning after receiving IV Dilaudid  earlier on, discontinue IV pain medications - PT consult pending, may need SNF  Active problems Chronic hyponatremia -continue salt tabs, sodium overall stable, labs with a significant delay this morning  Chronic anemia-hemoglobin appears at baseline, but labs are pending this morning, significantly delayed  RA-continue methotrexate   Hypokalemia-potassium replaced.  Magnesium  normal.  Recheck with significant delay today  Hyperlipidemia-continue statin  Chronic pain-continue Cymbalta   History of MS -not on active treatment  Scheduled Meds:  ascorbic acid   1,000 mg Oral Daily   atorvastatin   10 mg Oral QPM   docusate sodium   100 mg Oral Daily   DULoxetine   20 mg Oral QHS   folic acid   1 mg Oral Daily   pantoprazole   40 mg Oral BID AC   polyethylene glycol  17 g Oral Daily   potassium chloride  SA  10 mEq Oral QODAY   predniSONE   5 mg Oral Q breakfast   senna  1 tablet Oral QHS   sodium chloride  flush  3 mL  Intravenous Q12H   sodium chloride   1 g Oral BID WC   tamsulosin   0.4 mg Oral QPC supper   zinc  sulfate (50mg  elemental zinc )  220 mg Oral Q supper   Continuous Infusions: PRN Meds:.acetaminophen  **OR** acetaminophen , albuterol , bisacodyl , cyclobenzaprine , diazepam , diphenhydrAMINE , hydrALAZINE , metoCLOPramide  **OR** metoCLOPramide  (REGLAN ) injection, ondansetron  **OR** ondansetron  (ZOFRAN ) IV, oxyCODONE   Current Outpatient Medications  Medication Instructions   acetaminophen  (TYLENOL ) 650 mg, Oral, Every 6 hours PRN   atorvastatin  (LIPITOR) 10 mg, Oral, Every evening   Biotin  10,000 mcg, Oral, Every morning   bisacodyl  (DULCOLAX) 10 mg, Rectal, Daily PRN   Carboxymethylcellul-Glycerin  (LUBRICATING EYE DROPS OP) 1 drop, Both Eyes, Daily PRN   cholecalciferol  (VITAMIN D3) 1,000 Units, Oral, Every morning   cyclobenzaprine  (FLEXERIL ) 10 MG tablet TAKE 1 TABLET BY MOUTH THREE TIMES A DAY AS NEEDED FOR MUSCLE SPASM   diazepam  (VALIUM ) 5 MG tablet TAKE 1 TABLET BY MOUTH AT BEDTIME AS NEEDED FOR MUSCLE SPASMS.   docusate sodium  (COLACE) 100 mg, Oral, Daily   DULoxetine  (CYMBALTA ) 20 mg, Oral, Daily at bedtime   folic acid  (FOLVITE ) 1 mg, Oral, Daily   furosemide  (LASIX ) 10 mg, Oral, Daily   magic mouthwash (nystatin , lidocaine , diphenhydrAMINE , alum & mag hydroxide) suspension 5 mLs, Swish & Spit, 3 times daily PRN   methotrexate  (RHEUMATREX) 12.5 mg, Oral, Weekly, Caution:Chemotherapy. Protect from light.   Nystatin  (GERHARDT'S BUTT CREAM) CREA 1 Application, Topical, 3 times daily   oxyCODONE  (ROXICODONE ) 5 mg, Oral, Every 4 hours PRN  oxyCODONE  (ROXICODONE ) 5 mg, Oral, Every 4 hours PRN   pantoprazole  (PROTONIX ) 40 mg, Oral, 2 times daily before meals   polyethylene glycol powder (GLYCOLAX /MIRALAX ) 17 g, Oral, 2 times daily   potassium chloride  SA (KLOR-CON  M) 20 MEQ tablet 10 mEq, Oral, Every other day   predniSONE  (DELTASONE ) 5 MG tablet TAKE 1 TABLET BY MOUTH EVERY DAY IN THE MORNING    Prolia  60 mg, Subcutaneous, Every 6 months, Deliver to rheum: [address], [address], [place], [place] [address]. Appt on 07/23/2022   [Paused] [name]  15 mg, Daily   senna (SENOKOT) 8.6 MG tablet 1 tablet, Oral, Daily at bedtime   sodium chloride  1 g, Oral, 2 times daily with meals   tamsulosin  (FLOMAX ) 0.4 mg, Oral, Daily after supper   traMADol  (ULTRAM ) 25-50 mg, Oral, Every 8 hours PRN   vitamin C  1,000 mg, Oral, Daily   Zinc  Sulfate 220 mg, Oral, Daily with supper    Diet Orders (From admission, onward)     Start     Ordered   01/21/24 2119  Diet regular Room service appropriate? Yes; Fluid consistency: Thin  Diet effective now       Question Answer Comment  Room service appropriate? Yes   Fluid consistency: Thin      01/21/24 2118            DVT prophylaxis: SCDs Start: 01/21/24 2119 Place TED hose Start: 01/21/24 2119   Lab Results  Component Value Date   PLT 393 01/20/2024      Code Status: Full Code  Family Communication: No family at bedside  Status is: Inpatient   Level of care: Telemetry Medical  Consultants:  Orthopedic surgery  Objective: Vitals:   01/22/24 0112 01/22/24 0403 01/22/24 0453 01/22/24 0734  BP: 130/65 (!) 111/45 127/65 (!) 119/56  Pulse: (!) 109 (!) 109 (!) 108 (!) 111  Resp:  13 16 16   Temp: 98.1 F (36.7 C) 97.6 F (36.4 C)  98.2 F (36.8 C)  TempSrc: Oral   Oral  SpO2: 100% 99% 97% 96%  Weight:      Height:        Intake/Output Summary (Last 24 hours) at 01/22/2024 0916 Last data filed at 01/22/2024 0612 Gross per 24 hour  Intake 1585.25 ml  Output 380 ml  Net 1205.25 ml   Wt Readings from Last 3 Encounters:  01/21/24 47 kg  12/22/23 47.6 kg  12/03/23 48.6 kg    Examination:  Constitutional: NAD Eyes: lids and conjunctivae normal, no scleral icterus ENMT: mmm Neck: normal, supple Respiratory: clear to auscultation bilaterally, no wheezing, no crackles. Cardiovascular: Regular rate and rhythm, no murmurs /  rubs / gallops.  Abdomen: soft, no distention, no tenderness. Bowel sounds positive.   Data Reviewed: I have independently reviewed following labs and imaging studies   CBC Recent Labs  Lab 01/18/24 2239 01/20/24 0521  WBC 8.0 10.2  HGB 9.0* 10.0*  HCT 28.3* 32.0*  PLT 370 393  MCV 97.6 97.6  MCH 31.0 30.5  MCHC 31.8 31.3  RDW 18.6* 18.6*    Recent Labs  Lab 01/18/24 2239 01/20/24 0521  NA 131* 132*  K 3.5 3.3*  CL 97* 96*  CO2 26 25  GLUCOSE 116* 78  BUN 6* 6*  CREATININE 0.80 0.46  CALCIUM  8.1* 8.2*  MG  --  1.9    ------------------------------------------------------------------------------------------------------------------ No results for input(s): CHOL, HDL, LDLCALC, TRIG, CHOLHDL, LDLDIRECT in the last 72 hours.  Lab Results  Component  Value Date   HGBA1C 6.0 (H) 08/06/2023   ------------------------------------------------------------------------------------------------------------------ No results for input(s): TSH, T4TOTAL, T3FREE, THYROIDAB in the last 72 hours.  Invalid input(s): FREET3  Cardiac Enzymes No results for input(s): CKMB, TROPONINI, MYOGLOBIN in the last 168 hours.  Invalid input(s): CK ------------------------------------------------------------------------------------------------------------------    Component Value Date/Time   BNP 137 (H) 12/09/2019 1427    CBG: No results for input(s): GLUCAP in the last 168 hours.  Recent Results (from the past 240 hours)  Surgical pcr screen     Status: None   Collection Time: 01/21/24  1:26 PM   Specimen: Nasal Mucosa; Nasal Swab  Result Value Ref Range Status   MRSA, PCR NEGATIVE NEGATIVE Final   Staphylococcus aureus NEGATIVE NEGATIVE Final    Comment: (NOTE) The Xpert SA Assay (FDA approved for NASAL specimens in patients 48 years of age and older), is one component of a comprehensive surveillance program. It is not intended to diagnose infection  nor to guide or monitor treatment. Performed at [facility], [address], [place], [place] [address]   Aerobic/Anaerobic Culture w Gram Stain (surgical/deep wound)     Status: None (Preliminary result)   Collection Time: 01/21/24  5:03 PM   Specimen: Synovial, Right Hip; Body Fluid  Result Value Ref Range Status   Specimen Description   Final    SYNOVIAL Performed at [facility], [address], [place], [place] [address]    Special Requests SUPERFICIAL RIGHT HIP  Final   Gram Stain NO WBC SEEN NO ORGANISMS SEEN   Final   Culture   Final    NO GROWTH < 12 HOURS Performed at [facility], [address], [place], [place] [address]    Report Status PENDING  Incomplete     Radiology Studies: DG HIP UNILAT W OR W/O PELVIS 2-3 VIEWS RIGHT Result Date: 01/21/2024 CLINICAL DATA:  Status post right hip revision EXAM: DG HIP (WITH OR WITHOUT PELVIS) 2-3V RIGHT COMPARISON:  01/19/2019 FINDINGS: New femoral component is identified. It is well seated within the acetabular component. Stable left hip replacement and lumbar spine surgery is noted. IMPRESSION: Right hip revision Electronically Signed   By: Oneil Devonshire M.D.   On: 01/21/2024 19:53   DG HIP UNILAT WITH PELVIS 1V RIGHT Result Date: 01/21/2024 CLINICAL DATA:  Elective surgery. EXAM: DG HIP (WITH OR WITHOUT PELVIS) 1V RIGHT COMPARISON:  None Available. FINDINGS: Intraoperative spot views include bilateral total hip replacements in place. No acute osseous finding. Fluoro Time: 3 sec; Fluoro Dose: 0.2605 mGy IMPRESSION: Intraoperative spot views. Electronically Signed   By: Megan  Zare M.D.   On: 01/21/2024 18:09   DG C-Arm 1-60 Min-No Report Result Date: 01/21/2024 Fluoroscopy was utilized by the requesting physician.  No radiographic interpretation.   DG C-Arm 1-60 Min-No Report Result Date: 01/21/2024 Fluoroscopy was utilized by the requesting physician.  No radiographic interpretation.      Nilda Fendt, MD, PhD [facility]  Between 7 am - 7 pm I am available, please contact me via Amion (for emergencies) or Securechat (non urgent messages)  Between 7 pm - 7 am I am not available, please contact night coverage MD/APP via Amion

## 2024-01-23 DIAGNOSIS — S73004D Unspecified dislocation of right hip, subsequent encounter: Secondary | ICD-10-CM | POA: Diagnosis not present

## 2024-01-23 LAB — MAGNESIUM: Magnesium: 1.8 mg/dL (ref 1.7–2.4)

## 2024-01-23 LAB — BPAM RBC
Blood Product Expiration Date: 202509242359
ISSUE DATE / TIME: 202508291532
Unit Type and Rh: 6200

## 2024-01-23 LAB — CBC
HCT: 29.2 % — ABNORMAL LOW (ref 36.0–46.0)
Hemoglobin: 9.2 g/dL — ABNORMAL LOW (ref 12.0–15.0)
MCH: 29 pg (ref 26.0–34.0)
MCHC: 31.5 g/dL (ref 30.0–36.0)
MCV: 92.1 fL (ref 80.0–100.0)
Platelets: 280 K/uL (ref 150–400)
RBC: 3.17 MIL/uL — ABNORMAL LOW (ref 3.87–5.11)
RDW: 19.9 % — ABNORMAL HIGH (ref 11.5–15.5)
WBC: 12.3 K/uL — ABNORMAL HIGH (ref 4.0–10.5)
nRBC: 0 % (ref 0.0–0.2)

## 2024-01-23 LAB — COMPREHENSIVE METABOLIC PANEL WITH GFR
ALT: 7 U/L (ref 0–44)
AST: 21 U/L (ref 15–41)
Albumin: 1.9 g/dL — ABNORMAL LOW (ref 3.5–5.0)
Alkaline Phosphatase: 54 U/L (ref 38–126)
Anion gap: 10 (ref 5–15)
BUN: 8 mg/dL (ref 8–23)
CO2: 25 mmol/L (ref 22–32)
Calcium: 8.3 mg/dL — ABNORMAL LOW (ref 8.9–10.3)
Chloride: 97 mmol/L — ABNORMAL LOW (ref 98–111)
Creatinine, Ser: 0.48 mg/dL (ref 0.44–1.00)
GFR, Estimated: >60 mL/min (ref >60)
Glucose, Bld: 82 mg/dL (ref 70–99)
Potassium: 3.6 mmol/L (ref 3.5–5.1)
Sodium: 132 mmol/L — ABNORMAL LOW (ref 135–145)
Total Bilirubin: 0.8 mg/dL (ref 0.0–1.2)
Total Protein: 4.7 g/dL — ABNORMAL LOW (ref 6.5–8.1)

## 2024-01-23 LAB — TYPE AND SCREEN
ABO/RH(D): A POS
Antibody Screen: NEGATIVE
Unit division: 0

## 2024-01-23 MED ORDER — FOOD THICKENER (SIMPLYTHICK): 1.0000 | ORAL | Status: DC | PRN

## 2024-01-23 MED ORDER — SODIUM CHLORIDE 0.9 % IV SOLN
2.0000 g | INTRAVENOUS | Status: DC
  Administered 2024-01-23 – 2024-01-24 (×2): 2 g via INTRAVENOUS
  Filled 2024-01-23 (×2): qty 20

## 2024-01-23 NOTE — Progress Notes (Signed)
 Subjective:  Patient reports pain as mild to moderate.  Denies N/V/CP/SOB/Abd pain. She reports pain in her hip that is better today. Sitter at bedside.  She denies tingling or numbness in LE bilaterally.  Voiding with purewick  Still does not have hip abduction brace on, was told today that it was too big.   Objective:   VITALS:   Vitals:   01/23/24 0500 01/23/24 0746 01/23/24 1312 01/23/24 1629  BP:  (!) 125/51 (!) 111/56 (!) 106/51  Pulse:  98 87 89  Resp: 15 17 15 18   Temp:  98.5 F (36.9 C) 98.6 F (37 C) 97.6 F (36.4 C)  TempSrc:  Oral Oral Oral  SpO2:  100% 99% 100%  Weight:      Height:        NAD Neurologically intact ABD soft Neurovascular intact Sensation intact distally Intact pulses distally Dorsiflexion/Plantar flexion intact No cellulitis present Compartment soft JP drain output 60 cc SS fluid. Dressing C/D/I. Continue JP drain.  Prevena negative pressure dressing no leaks detected. Continue. Dressing C/D/I.  No ER or shortening noted.   Lab Results  Component Value Date   WBC 12.3 (H) 01/23/2024   HGB 9.2 (L) 01/23/2024   HCT 29.2 (L) 01/23/2024   MCV 92.1 01/23/2024   PLT 280 01/23/2024   BMET    Component Value Date/Time   NA 132 (L) 01/23/2024 0828   NA 139 06/20/2019 1335   NA 136 02/22/2015 0000   K 3.6 01/23/2024 0828   K 4.4 02/22/2015 0000   CL 97 (L) 01/23/2024 0828   CL 98 02/22/2015 0000   CO2 25 01/23/2024 0828   CO2 30 02/22/2015 0000   GLUCOSE 82 01/23/2024 0828   BUN 8 01/23/2024 0828   BUN 10 06/20/2019 1335   CREATININE 0.48 01/23/2024 0828   CREATININE 1.03 (H) 08/06/2023 1510   CALCIUM  8.3 (L) 01/23/2024 0828   CALCIUM  9.6 02/22/2015 0000   EGFR 55 (L) 08/06/2023 1510   GFRNONAA >60 01/23/2024 0828   GFRNONAA 56 (L) 09/26/2020 1536     Assessment/Plan: 2 Days Post-Op   Principal Problem:   Hip dislocation, right (HCC) Active Problems:   Hyperlipidemia   Multiple sclerosis (HCC)    Rheumatoid arthritis (HCC)   GERD (gastroesophageal reflux disease)   Hyponatremia   Anemia of chronic disease   Chronic pain    ABLA. Hemoglobin 7.1 yesterday she received 1 unit PRBC hemoglobin 9.2 today. Continue to monitor.    WBAT with walker,  Posterior hip precautions. Hip abduction brace at all times.  DVT ppx: None due to anemia, SCDs, TEDS PO pain control PT/OT: Mobilize out of bed with PT Dispo:  - Patient under care of the medical team disposition per their recommendation.  -  We are holding chemical DVT prophylaxis due to anemia.  - Follow IntraOp culture. Gram stain negative. Culture gram negative rods, started on ceftriaxone . Discussed with Dr. Fidel. Will continue to follow.  - We will discontinue the JP drain when output is less than 10 cc per shift.  - Continue negative pressure dressing at 75 mmHg. Upon discharge, the house VAC suction unit will need to be exchanged for a portable Prevena suction unit. She will need to follow-up in the office within 7 days of discharge for removal of her negative pressure dressing.    Valery GORMAN Potters 01/23/2024, 4:39 PM   [facility]  [facility] [address], [address], [place], [place] [address] Phone: [phone]  [email or web address] Facebook  [employer]

## 2024-01-23 NOTE — Evaluation (Signed)
 Clinical/Bedside Swallow Evaluation Patient Details  Name: Sherri [name] MRN: [id number] Date of Birth: January 31, 1941  Today's Date: 01/23/2024 Time: SLP Start Time (ACUTE ONLY): 0945 SLP Stop Time (ACUTE ONLY): 1020 SLP Time Calculation (min) (ACUTE ONLY): 35 min  Past Medical History:  Past Medical History:  Diagnosis Date   Anxiety    takes Valium  daily as needed   Basal cell carcinoma 01/21/1988   left nostril (MOHS), sup-right calf (CX35FU)   Basal cell carcinoma 03/22/1996   right calf (CX35FU)   Basal cell carcinoma 03/31/1995   left wing nose   Bruises easily    d/t meds   Cancer (HCC)    basal cell ca, in situ- uterine    Cataracts, bilateral    removed bilateral   Chronic back pain    Dizziness    r/t to meds   GERD (gastroesophageal reflux disease)    no meds on a regular basis but will take Tums if needed   Headache(784.0)    r/t neck issues   History of bronchitis 6-16yrs ago   History of colon polyps    benign   HOH (hard of hearing)    wears hearing aids   Hyperlipidemia    takes Atorvastatin  on Mondays and Fridays   Hypertension    Joint pain    Joint swelling    Multiple sclerosis (HCC)    Neuromuscular disorder (HCC)    Dr. FABIENE Crete- [facility], follows [facility]   Nocturia    PONV (postoperative nausea and vomiting)    trouble urinating after surgery in 2014   Postoperative nausea and vomiting 01/11/2019   Rheumatoid arthritis (HCC)    Dr Lorrayne weekly, RA- hands- knees- feet    Rheumatoid arthritis(714.0)    Dr Dolphus takes Xeljanz  daily   Right wrist fracture    Skin cancer    Squamous cell carcinoma of skin 11/02/2001   in situ-right knee (cx38fu)   Squamous cell carcinoma of skin 11/12/2011   in situ-left forearm (CX35FU), in situ-left foot (CX35FU)   Squamous cell carcinoma of skin 01/22/2012   in situ-left lower forearm (txpbx)   Squamous cell carcinoma of skin 11/17/2012   right shin (txpbx)   Squamous cell  carcinoma of skin 10/28/2013   in situ-right shoulder (CX35FU)   Squamous cell carcinoma of skin 04/28/2014   well diff-right shin (txpbx)   Squamous cell carcinoma of skin 11/02/2014   in situ-Left shin (txpbx)   Squamous cell carcinoma of skin 12/15/2014   in situ-Left hand (txpbx), bowens-left side chest (txpbx)   Squamous cell carcinoma of skin 05/09/2015   in situ-left hand (txpbx)    Squamous cell carcinoma of skin 05/07/2016   in situ-left inner shin,ant, in situ-left inner shin, post, in situ-left outer forearm, in situ-right knuckle   Squamous cell carcinoma of skin 03/1302018   in situ-left shoulder (txpbx), in situ-right inner shin (txpbx), in situ-top of left foot (txpbx), KA- left forearm (txpbx)   Squamous cell carcinoma of skin 12/02/2016   in situ-left inner shin (txpbx)   Squamous cell carcinoma of skin 03/04/2017   in situ-left outer sup, shin (txpbx), in situ- right 2nd knuckle finger (txpbx), in situ- Left wrist (txpbx)   Squamous cell carcinoma of skin 04/06/2018   in situ-above left knee inner (txpbx)   Squamous cell carcinoma of skin 04/21/2018   in situ-right top hand (txpbx)   Squamous cell carcinoma of skin 07/08/2018   in situ-right lower inner shin (txpbx)  Squamous cell carcinoma of skin 04/27/2019   in situ- Left neck(CX35FU), In situ- right neck (CX35FU)   Urinary retention    sees Dr.Wrenn about 2 times a yr   Past Surgical History:  Past Surgical History:  Procedure Laterality Date   ABDOMINAL HYSTERECTOMY     1985   ANTERIOR CERVICAL DECOMP/DISCECTOMY FUSION N/A 04/23/2022   Procedure: Anterior Cervical Decompression/Discectomy Fusion - Cervical Seven-Thoracic One;  Surgeon: Louis Shove, MD;  Location: [facility];  Service: Neurosurgery;  Laterality: N/A;  3C   ANTERIOR HIP REVISION Right 01/21/2024   Procedure: REVISION, TOTAL ARTHROPLASTY, HIP, ANTERIOR APPROACH;  Surgeon: Fidel Rogue, MD;  Location: [facility];  Service: Orthopedics;  Laterality:  Right;   APPENDECTOMY     with TAH   BACK SURGERY     several   BREAST BIOPSY Right    Results were negative   BROW LIFT Bilateral 10/02/2016   Procedure: BILATERAL LOWER LID BLEPHAROPLASTY;  Surgeon: Laurie Loyd Redhead, MD;  Location: [facility];  Service: Plastics;  Laterality: Bilateral;   CARPAL TUNNEL RELEASE Right    cataracts     CERVICAL FUSION     Dr Leeann   CERVICAL FUSION  12/31/2012   Dr Leeann   CERVICAL FUSION  04/2022   CHEST TUBE INSERTION     for traumatic Pneumothorax   COLONOSCOPY  07/16/2010   normal    COLONOSCOPY W/ POLYPECTOMY  1997   negative since; Dr Debrah   ESOPHAGOGASTRODUODENOSCOPY  07/16/2010   normal   eye lid raise     EYE SURGERY Bilateral    cataracts removed - /w IOL   HARDWARE REMOVAL  08/2021   HIP CLOSED REDUCTION Right 11/16/2023   Procedure: CLOSED MANIPULATION, JOINT, HIP;  Surgeon: Ernie Cough, MD;  Location: [facility];  Service: Orthopedics;  Laterality: Right;   HIP CLOSED REDUCTION N/A 11/30/2023   Procedure: CLOSED REDUCTION, HIP;  Surgeon: Fidel Rogue, MD;  Location: [facility];  Service: Orthopedics;  Laterality: N/A;   INCISION AND DRAINAGE OF WOUND Right 11/28/2023   Procedure: IRRIGATION AND DEBRIDEMENT WOUND; REMOVAL OF HARDWARE;  Surgeon: Edna Toribio LABOR, MD;  Location: [facility];  Service: Orthopedics;  Laterality: Right;  I & D RIGHT ELBOW   JOINT REPLACEMENT     LUMBAR FUSION     Dr Leeann   NASAL SINUS SURGERY     ORIF ELBOW FRACTURE Right 11/20/2023   Procedure: OPEN REDUCTION INTERNAL FIXATION (ORIF) ELBOW/OLECRANON FRACTURE;  Surgeon: [name] Franky SQUIBB, MD;  Location: [facility];  Service: Orthopedics;  Laterality: Right;   POSTERIOR CERVICAL FUSION/FORAMINOTOMY N/A 12/31/2012   Procedure: POSTERIOR LATERAL CERVICAL FUSION/FORAMINOTOMY LEVEL 1 CERVICAL THREE-FOUR WITH LATERAL MASS SCREWS;  Surgeon: Catalina CHRISTELLA Leeann, MD;  Location: [facility];  Service: Neurosurgery;  Laterality: N/A;   PTOSIS REPAIR Bilateral 10/02/2016    Procedure: INTERNAL PTOSIS REPAIR;  Surgeon: Laurie Loyd Redhead, MD;  Location: [facility];  Service: Plastics;  Laterality: Bilateral;   SKIN BIOPSY     THYROID  SURGERY     R lobe removed- 1972, has grown back - CT last done- 2018   TOTAL HIP ARTHROPLASTY  12/22/2011   Procedure: TOTAL HIP ARTHROPLASTY;  Surgeon: Dempsey JINNY Sensor, MD;  Location: [facility];  Service: Orthopedics;  Laterality: Left;   TOTAL HIP ARTHROPLASTY Right 11/12/2023   Procedure: ARTHROPLASTY, HIP, TOTAL, ANTERIOR APPROACH;  Surgeon: Fidel Rogue, MD;  Location: [facility];  Service: Orthopedics;  Laterality: Right;   TOTAL HIP REVISION Right 12/03/2023  Procedure: TOTAL HIP REVISION;  Surgeon: Fidel Rogue, MD;  Location: [facility];  Service: Orthopedics;  Laterality: Right;  Right acetabular revision Anterior approach   TOTAL SHOULDER ARTHROPLASTY     TUBAL LIGATION     UPPER GASTROINTESTINAL ENDOSCOPY  2012   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   WRIST SURGERY Left 09/16/2022   wrist/arm   HPI:  Pt is a 83 year old female who was found to have persistent hip dislocation, orthopedic surgery was consulted and she was admitted to the hospital. She was taken to the OR on 8/28 by Dr. Fidel and is status post right total hip revision, head ball and liner exchange. Per MD H&P She receives 24/7 care at home. She has difficulty swallowing, even on good days. MBS (11/30/23) revealed moderate to severe oropharyngeal dysphagia with a DIGEST score of 4 due to chronic and gross aspiration of thin liquids and poor efficiency of swallowing with greater than 25% of bolus residue remaining in valleculae with solids. Primary problem is decreased epiglottic deflection and decreased PES opening, both appearing due to structural changes of pharynx after ACDF. Recommendation was for dys 2 solids/NTL at that time. On 12/07/23, pt advanced to reg/thin diet due to poor PO intake and after clinician educated regarding risks. Pt received IP SLP services for  dysphagia ending on 01/01/24. PMH:  RA, HLD, MS, chronic pain etiology closely right hip pain, GERD.    Assessment / Plan / Recommendation  Clinical Impression  Pt with h/o oropharyngeal dysphagia and SLP intervention over past 2 months (see HPI for results of MBS). Family and caregiver in room report pt refusing NTL at home, but continues to cough on thin liquids consistently. She is consuming mostly soft foods at home. Oral mechanism examination WFL this date. With assist she consumed thin liquids via straw/cup with overt coughing exhibited after each presentation. NTL by straw eliminated s/sx. Bites of puree and regular solid were also WFL, although pt did utilize repeat dry swallow with solids and suspect due to pharyngeal residuals as noted via MBS. Family and pt desire to trial NTL again and educated on the use of free water  protocol in order to provide hydration in between meals (after oral care) and provide greater quality of life for pt. Family and pt agreeable to plan and will initiate dys 3 diet/NTL and Free Water  Protocol with adherence to aspiration precautions. If pt consistently refuses NTL, increasing risk for dehydration, then contact SLP team to reassess options and educate. Will f/u.  SLP Visit Diagnosis: Dysphagia, oropharyngeal phase (R13.12)    Aspiration Risk  Moderate aspiration risk    Diet Recommendation Dysphagia 3 (Mech soft);Nectar-thick liquid    Liquid Administration via: Cup;Straw Medication Administration: Crushed with puree Supervision: Staff to assist with self feeding;Full supervision/cueing for compensatory strategies Compensations: Minimize environmental distractions;Slow rate;Small sips/bites;Multiple dry swallows after each bite/sip;Effortful swallow Postural Changes: Seated upright at 90 degrees;Remain upright for at least 30 minutes after po intake    Other  Recommendations Oral Care Recommendations: Oral care BID;Oral care prior to ice chip/H20      Assistance Recommended at Discharge    Functional Status Assessment Patient has had a recent decline in their functional status and demonstrates the ability to make significant improvements in function in a reasonable and predictable amount of time.  Frequency and Duration min 2x/week  2 weeks       Prognosis Prognosis for improved oropharyngeal function: Fair Barriers to Reach Goals: Time post onset  Swallow Study   General Date of Onset: 11/30/23 HPI: Pt is a 83 year old female who was found to have persistent hip dislocation, orthopedic surgery was consulted and she was admitted to the hospital. She was taken to the OR on 8/28 by Dr. Fidel and is status post right total hip revision, head ball and liner exchange. Per MD H&P She receives 24/7 care at home. She has difficulty swallowing, even on good days. MBS (11/30/23) revealed moderate to severe oropharyngeal dysphagia with a DIGEST score of 4 due to chronic and gross aspiration of thin liquids and poor efficiency of swallowing with greater than 25% of bolus residue remaining in valleculae with solids. Primary problem is decreased epiglottic deflection and decreased PES opening, both appearing due to structural changes of pharynx after ACDF. Recommendation was for dys 2 solids/NTL at that time. On 12/07/23, pt advanced to reg/thin diet due to poor PO intake and after clinician educated regarding risks. Pt received IP SLP services for dysphagia ending on 01/01/24. PMH:  RA, HLD, MS, chronic pain etiology closely right hip pain, GERD. Type of Study: Bedside Swallow Evaluation Previous Swallow Assessment: see HPI Diet Prior to this Study: Regular;Thin liquids (Level 0) Temperature Spikes Noted: No Respiratory Status: Nasal cannula History of Recent Intubation: No Behavior/Cognition: Alert;Cooperative;Pleasant mood Oral Cavity Assessment: Within Functional Limits Oral Care Completed by SLP: No Oral Cavity - Dentition: Adequate  natural dentition Vision: Functional for self-feeding Self-Feeding Abilities: Needs assist Patient Positioning: Upright in bed;Postural control adequate for testing Baseline Vocal Quality: Normal Volitional Cough: Strong Volitional Swallow: Able to elicit    Oral/Motor/Sensory Function Overall Oral Motor/Sensory Function: Within functional limits   Ice Chips Ice chips: Not tested   Thin Liquid Thin Liquid: Impaired Presentation: Cup;Self Fed;Straw Pharyngeal  Phase Impairments: Suspected delayed Swallow;Cough - Immediate;Throat Clearing - Immediate    Nectar Thick Nectar Thick Liquid: Within functional limits Presentation: Straw;Self Fed   Honey Thick Honey Thick Liquid: Not tested   Puree Puree: Within functional limits Presentation: Spoon   Solid     Solid: Within functional limits Presentation: Self Fed       Wilder Kin, MA, CCC-SLP Acute Rehabilitation Services Office Number: [phone]  Wilder KANDICE Kin 01/23/2024,11:02 AM

## 2024-01-23 NOTE — Evaluation (Signed)
 Physical Therapy Evaluation Patient Details Name: Sherri [name] MRN: [id number] DOB: [birth year] Today's Date: 01/23/2024  History of Present Illness  Sherri  [name] is 83 y.o. female admitted 01/18/24 for right hip dislocation. Pt s/p R THA revision, head ball and liner exchange 8/28. PMHx: anxiety, GERD, HLD, HTN, MS, ACDF C7-T1. 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   Clinical Impression  Pt in bed upon arrival with caregiver present and agreeable to PT eval. Since July, pt had been performing stand-pivot transfers to [facility] with assist and use of RW. Pt needed assistance to propel WC. Prior to May, pt was ambulating with no AD. Limited mobility in today's session as pt's hip ABD brace was poor fitting with orders to have on at all times. Attempted to roll to the left to reposition with MaxA with pt unable to complete movement due to pain. Pt has 24/7 assist available upon return home, however, would need to be able to transfer with +1 assist. Family would prefer to have pt return home if pt is able to progress mobility. At this time, recommending HHPT with PT to follow and assess progress. Pt would benefit from acute skilled PT with current functional limitations listed below (see PT Problem List). Acute PT to follow.         If plan is discharge home, recommend the following: A lot of help  with walking and/or transfers;A lot of help with bathing/dressing/bathroom;Assistance with cooking/housework;Direct supervision/assist for medications management;Direct supervision/assist for financial management;Assist for transportation;Help with stairs or ramp for entrance   Can travel by private vehicle    TBD    Equipment Recommendations None recommended by PT     Functional Status Assessment Patient has had a recent decline in their functional status and demonstrates the ability to make significant improvements in function in a reasonable and predictable amount of time.     Precautions / Restrictions Precautions Precautions: Fall;Posterior Hip Precaution Booklet Issued: Yes (comment) Recall of Precautions/Restrictions: Impaired Precaution/Restrictions Comments: JP bulb, R groin wound vac Required Braces or Orthoses: Other Brace Other Brace: [place] Hip Abduction Brace Restrictions Weight Bearing Restrictions Per Provider Order: Yes RUE Weight Bearing Per Provider Order: Partial weight bearing RUE Partial Weight Bearing Percentage or Pounds: WBAT with R elbow extended, able to use RW per MD RLE Weight Bearing Per Provider Order: Weight bearing as tolerated      Mobility  Bed Mobility Overal bed mobility: Needs Assistance Bed Mobility: Rolling Rolling: Max assist, Used rails    General bed mobility comments: MaxA to roll with hand over hand placement to reach for rail, used bed pad to assist with pt crying out in pain to stop    Transfers  General transfer comment: Deferred       Balance Overall balance assessment: History of Falls       Pertinent Vitals/Pain Pain Assessment Pain Assessment: Faces Faces Pain Scale: Hurts whole lot Pain Location:  R groin Pain Descriptors / Indicators: Aching, Discomfort, Crying Pain Intervention(s): Limited activity within patient's tolerance, Repositioned, Monitored during session, Premedicated before session    Home Living  Family/patient expects to be discharged to:: Private residence Living Arrangements: Spouse/significant other Available Help at Discharge: Family;Available PRN/intermittently;Available 24 hours/day;Personal care attendant Type of Home: House Home Access: Ramped entrance      Home Layout: Able to live on main level with bedroom/bathroom Home Equipment: Rollator (4 wheels);BSC/3in1;Shower seat - built in;Grab bars - tub/shower;Wheelchair - Forensic psychologist (2 wheels);Other (comment) (adjustable bed)      Prior Function Prior Level of Function : Needs assist;History of Falls (last six months)        Mobility Comments: After fall in July, performs stand-pivots with assist to [facility] using RW. Assist to propel WC. Prior to May, was ambulating with no AD. At least 4 falls ADLs Comments: Has been doing bed baths after fall in July, assist for all ADLs and iADLs     Extremity/Trunk Assessment   Upper Extremity Assessment Upper Extremity Assessment: Defer to OT evaluation    Lower Extremity Assessment Lower Extremity Assessment: RLE deficits/detail;LLE deficits/detail RLE Deficits / Details: Deferred formal MMT due to pain, Able to ankle DF/PF with fair quad contraction RLE: Unable to fully assess due to pain RLE Sensation: WNL LLE Deficits / Details: Hip flexion 3+/5, Ankle DF 4/5 LLE Sensation: WNL       Communication   Communication Communication: Impaired Factors Affecting Communication: Hearing impaired    Cognition Arousal: Alert Behavior During Therapy: Flat affect   PT - Cognitive impairments: Awareness, Memory, Attention, Sequencing, Problem solving, Safety/Judgement, Initiation    Following commands: Impaired Following commands impaired: Follows one step commands inconsistently, Follows one step commands with increased time     Cueing Cueing Techniques: Verbal cues, Tactile cues, Visual cues     General Comments General comments (skin integrity, edema, etc.):  Caregiver in room and supportive throughout session. Assisted with clarifying home set-up questions. Reports 24/7 assist available at home, however, pt would need to be +1 assist to return home safely    Exercises General Exercises - Lower Extremity Ankle Circles/Pumps: AROM, Both, 5 reps, Supine Quad Sets: Strengthening, Both, Supine (3 reps) Hip ABduction/ADduction: AAROM, Left, 5 reps, Supine   Assessment/Plan    PT Assessment Patient needs continued PT services  PT Problem List Decreased strength;Decreased range of motion;Decreased activity tolerance;Decreased balance;Decreased mobility;Decreased cognition;Decreased knowledge of use of DME;Decreased safety awareness;Decreased knowledge of precautions       PT Treatment Interventions DME instruction;Functional mobility training;Therapeutic activities;Therapeutic exercise;Balance training;Neuromuscular re-education;Patient/family education;Wheelchair mobility training;Gait training    PT Goals (Current goals can be found in the Care Plan section)  Acute Rehab PT Goals Patient Stated Goal: to go back home PT Goal Formulation: With patient/family Time For Goal Achievement: 02/06/24 Potential to Achieve Goals: Fair    Frequency Min 2X/week        AM-PAC PT 6 Clicks Mobility  Outcome Measure Help needed turning from your back to your side while in a flat bed without using bedrails?: A Lot Help needed moving from lying on your back to sitting on the side of a flat bed without using bedrails?: A Lot Help needed moving to and from a bed to a chair (including a wheelchair)?: A Lot Help needed standing up from a chair using your arms (e.g., wheelchair or bedside chair)?: Total Help needed to walk in hospital room?: Total Help needed climbing 3-5 steps with a railing? :  Total 6 Click Score: 9    End of Session   Activity Tolerance: Patient limited by pain Patient left: in bed;with call bell/phone within reach;with bed alarm  set;Other (comment) (caregiver in room) Nurse Communication: Mobility status PT Visit Diagnosis: Unsteadiness on feet (R26.81);Other abnormalities of gait and mobility (R26.89);Muscle weakness (generalized) (M62.81);History of falling (Z91.81);Difficulty in walking, not elsewhere classified (R26.2);Pain Pain - Right/Left: Right Pain - part of body: Hip    Time: 1100-1125 PT Time Calculation (min) (ACUTE ONLY): 25 min   Charges:   PT Evaluation $PT Eval Low Complexity: 1 Low   PT General Charges $$ ACUTE PT VISIT: 1 Visit    Kate ORN, PT, DPT Secure Chat Preferred  Rehab Office [phone]   Kate BRAVO Wendolyn 01/23/2024, 12:37 PM

## 2024-01-23 NOTE — Progress Notes (Signed)
 Physical Therapy Treatment Patient Details Name: Sherri  KAEDENCE [name] MRN: [id number] DOB: [birth year] Today's Date: 01/23/2024   History of Present Illness Sherri [name] is 83 y.o. female admitted 01/18/24 for right hip dislocation. Pt s/p R THA revision, head ball and liner exchange 8/28. PMHx: anxiety, GERD, HLD, HTN, MS, ACDF C7-T1. 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    PT Comments  Follow-up PT visit to further evaluate hip ABD brace and work on repositioning in supine. Unable to adjust hip ABD brace for proper fit with further mobility deferred until pt is able to receive a new brace. Discussed with pt/caregiver that PT reached out to [facility] and is waiting for a response back. Ortho PA-C notified and aware of situation. Pt's right hip tends to rest in external rotation with slight knee flexion. Support provided to bring R LE into neutral with education provided to caregiver on how to assist with proper placement. Will continue to follow acutely.     If plan is discharge home, recommend the following: A lot of help with walking and/or transfers;A lot of help with bathing/dressing/bathroom;Assistance with cooking/housework;Direct supervision/assist for medications management;Direct supervision/assist for financial management;Assist for transportation;Help with stairs or ramp for entrance    Can travel by private vehicle      TBD  Equipment Recommendations  None recommended by PT       Precautions / Restrictions Precautions Precautions: Fall;Posterior Hip Precaution Booklet Issued: Yes (comment) Recall of Precautions/Restrictions: Impaired Precaution/Restrictions Comments: JP bulb, R groin wound vac Required Braces or Orthoses: Other Brace Other Brace: Hip Abduction Brace on at all times Restrictions Weight Bearing Restrictions Per Provider Order: Yes RUE Weight Bearing Per Provider Order: Partial weight bearing RUE Partial Weight Bearing Percentage or Pounds: WBAT with R elbow extended, able to use RW per MD RLE Weight Bearing Per Provider Order: Weight bearing as tolerated     Mobility  Bed Mobility Overal bed mobility: Needs Assistance Bed Mobility: Rolling Rolling: Max assist, Used rails      General bed mobility comments: MaxA to roll with hand over hand placement to reach for rail, used bed pad to assist with pt crying out in pain to stop    Transfers   General transfer comment: Deferred    Balance Overall balance assessment: History of Falls       Communication Communication Communication: Impaired Factors Affecting Communication: Hearing impaired  Cognition Arousal: Alert Behavior During Therapy: Flat affect   PT - Cognitive impairments: Awareness, Memory, Attention, Sequencing, Problem solving, Safety/Judgement, Initiation    Following commands: Impaired Following commands impaired: Follows one step commands inconsistently, Follows one step commands with increased time    Cueing Cueing Techniques: Verbal cues, Tactile cues, Visual cues     General Comments General comments (skin integrity, edema, etc.): Further evaluation of hip ABD brace with education provided to pt/caregiver on reaching out to [facility] and MD regarding poor fit. Educated on repositioning  for R LE due to hip resting in external rotation with knee flexed. Pt/caregiver  verbalizing understanding      Pertinent Vitals/Pain Pain Assessment Pain Assessment: No/denies pain Faces Pain Scale: Hurts whole lot Pain Location: R groin Pain Descriptors / Indicators: Aching, Discomfort, Crying Pain Intervention(s): Limited activity within patient's tolerance, Repositioned, Monitored during session, Premedicated before session    Home Living Family/patient expects to be discharged to:: Private residence Living Arrangements: Spouse/significant other Available Help at Discharge: Family;Available PRN/intermittently;Available 24 hours/day;Personal care attendant Type of Home: House Home Access: Ramped entrance       Home Layout: Able to live on main level with bedroom/bathroom Home Equipment: Rollator (4 wheels);BSC/3in1;Shower seat - built in;Grab bars - tub/shower;Wheelchair - Forensic psychologist (2 wheels);Other (comment) (adjustable bed)          PT Goals (current goals can now be found in the care plan section) Acute Rehab PT Goals Patient Stated Goal: to go back home PT Goal Formulation: With patient/family Time For Goal Achievement: 02/06/24 Potential to Achieve Goals: Fair Progress towards PT goals: Progressing toward goals    Frequency    Min 2X/week       AM-PAC PT 6 Clicks Mobility   Outcome Measure  Help needed turning from your back to your side while in a flat bed without using bedrails?: A Lot Help needed moving from lying on your back to sitting on the side of a flat bed without using bedrails?: A Lot Help needed moving to and from a bed to a chair (including a wheelchair)?: A Lot Help needed standing up from a chair using your arms (e.g., wheelchair or bedside chair)?: Total Help needed to walk in hospital room?: Total Help needed climbing 3-5 steps with a railing? : Total 6 Click Score: 9    End of Session   Activity Tolerance: Patient tolerated treatment well Patient left: in bed;with call bell/phone within reach;with  bed alarm set;Other (comment) (caregiver in room) Nurse Communication: Mobility status PT Visit Diagnosis: Unsteadiness on feet (R26.81);Other abnormalities of gait and mobility (R26.89);Muscle weakness (generalized) (M62.81);History of falling (Z91.81);Difficulty in walking, not elsewhere classified (R26.2);Pain Pain - Right/Left: Right Pain - part of body: Hip     Time: 8651-8586 PT Time Calculation (min) (ACUTE ONLY): 25 min  Charges:    $Therapeutic Activity: 8-22 mins $Self Care/Home Management: 8-22 PT General Charges $$ ACUTE PT VISIT: 1 Visit                    Kate ORN, PT, DPT Secure Chat Preferred  Rehab Office [phone]   Kate BRAVO Wendolyn 01/23/2024, 2:22 PM

## 2024-01-23 NOTE — Progress Notes (Signed)
 PROGRESS NOTE  Sherri [name]  [name] FMW:[id number] DOB: April 05, 1941 DOA: 01/18/2024 PCP: Sherri [name]   LOS: 3 days   Brief Narrative / Interim history: 83 year old female with RA, MS, chronic pain etiology closely right hip pain.  Was found to have persistent hip dislocation, orthopedic surgery was consulted and she was admitted to the hospital  Subjective / 24h Interval events: Awake today, complains of right hip pain  Assesement and Plan: Principal Problem:   Hip dislocation, right (HCC) Active Problems:   Hyponatremia   Anemia of chronic disease   Rheumatoid arthritis (HCC)   Hyperlipidemia   Chronic pain   Multiple sclerosis (HCC)   GERD (gastroesophageal reflux disease)  Principal problem Right hip dislocation-orthopedic surgery consulted, she was taken to the OR on 8/28 by Dr. Fidel and is status post right total hip revision, head ball and liner exchange - PT consult pending, may need SNF, however in discussing with her husband he wants her home and has enough around-the-clock care  Active problems Chronic hyponatremia -continue salt tabs, sodium overall stable  Chronic anemia, acute blood loss anemia-hemoglobin dipped to 7.1 on 8/29, postoperatively, she was given a unit of packed red blood cells and improved appropriately at 9.2 today  Dysphagia-on morning rounds patient was coughing intermittently when she was being fed by the private aide.  The aide told me that she has been doing this for several months.  Speech therapy consulted  RA-continue methotrexate   Hypokalemia-potassium and magnesium  stable today  Hyperlipidemia-continue statin  Chronic pain-continue Cymbalta   History of MS -not on active treatment  Scheduled Meds:  ascorbic acid   1,000 mg Oral Daily   atorvastatin   10 mg Oral QPM   docusate sodium   100 mg Oral Daily   DULoxetine   20 mg Oral QHS   folic acid   1 mg Oral Daily   pantoprazole   40 mg Oral BID AC   polyethylene glycol  17 g  Oral Daily   potassium chloride  SA  10 mEq Oral QODAY   predniSONE   5 mg Oral Q breakfast   senna  1 tablet Oral QHS   sodium chloride  flush  3 mL Intravenous Q12H   sodium chloride   1 g Oral BID WC   tamsulosin   0.4 mg Oral QPC supper   zinc  sulfate (50mg  elemental zinc )  220 mg Oral Q supper   Continuous Infusions: PRN Meds:.acetaminophen  **OR** acetaminophen , albuterol , bisacodyl , cyclobenzaprine , diazepam , diphenhydrAMINE , hydrALAZINE , metoCLOPramide  **OR** metoCLOPramide  (REGLAN ) injection, ondansetron  **OR** ondansetron  (ZOFRAN ) IV, oxyCODONE   Current Outpatient Medications  Medication Instructions   acetaminophen  (TYLENOL ) 650 mg, Oral, Every 6 hours PRN   atorvastatin  (LIPITOR) 10 mg, Oral, Every evening   Biotin  10,000 mcg, Oral, Every morning   bisacodyl  (DULCOLAX) 10 mg, Rectal, Daily PRN   Carboxymethylcellul-Glycerin  (LUBRICATING EYE DROPS OP) 1 drop, Both Eyes, Daily PRN   cholecalciferol  (VITAMIN D3) 1,000 Units, Oral, Every morning   cyclobenzaprine  (FLEXERIL ) 10 MG tablet TAKE 1 TABLET BY MOUTH THREE TIMES A DAY AS NEEDED FOR MUSCLE SPASM   diazepam  (VALIUM ) 5 MG tablet TAKE 1 TABLET BY MOUTH AT BEDTIME AS NEEDED FOR MUSCLE SPASMS.   docusate sodium  (COLACE) 100 mg, Oral, Daily   DULoxetine  (CYMBALTA ) 20 mg, Oral, Daily at bedtime   folic acid  (FOLVITE ) 1 mg, Oral, Daily   furosemide  (LASIX ) 10 mg, Oral, Daily   magic mouthwash (nystatin , lidocaine , diphenhydrAMINE , alum & mag hydroxide) suspension 5 mLs, Swish & Spit, 3 times daily PRN   methotrexate  (RHEUMATREX) 12.5 mg, Oral,  Weekly, Caution:Chemotherapy. Protect from light.   Nystatin  (Sherri [name] BUTT CREAM) CREA 1 Application, Topical, 3 times daily   oxyCODONE  (ROXICODONE ) 5 mg, Oral, Every 4 hours PRN   oxyCODONE  (ROXICODONE ) 5 mg, Oral, Every 4 hours PRN   pantoprazole  (PROTONIX ) 40 mg, Oral, 2 times daily before meals   polyethylene glycol powder (GLYCOLAX /MIRALAX ) 17 g, Oral, 2 times daily   potassium  chloride SA (KLOR-CON  M) 20 MEQ tablet 10 mEq, Oral, Every other day   predniSONE  (DELTASONE ) 5 MG tablet TAKE 1 TABLET BY MOUTH EVERY DAY IN THE MORNING   Prolia  60 mg, Subcutaneous, Every 6 months, Deliver to rheum: [address], [place], [place] [address]. Appt on 07/23/2022   [Paused] [name]  15 mg, Daily   senna (SENOKOT) 8.6 MG tablet 1 tablet, Oral, Daily at bedtime   sodium chloride  1 g, Oral, 2 times daily with meals   tamsulosin  (FLOMAX ) 0.4 mg, Oral, Daily after supper   traMADol  (ULTRAM ) 25-50 mg, Oral, Every 8 hours PRN   vitamin C  1,000 mg, Oral, Daily   Zinc  Sulfate 220 mg, Oral, Daily with supper    Diet Orders (From admission, onward)     Start     Ordered   01/23/24 1040  DIET DYS 3 Room service appropriate? Yes with Assist; Fluid consistency: Nectar Thick  Diet effective now       Comments: Free Water  Protocol  Question Answer Comment  Room service appropriate? Yes with Assist   Fluid consistency: Nectar Thick      01/23/24 1040            DVT prophylaxis: SCDs Start: 01/21/24 2119 Place TED hose Start: 01/21/24 2119   Lab Results  Component Value Date   PLT 280 01/23/2024      Code Status: Full Code  Family Communication: No family at bedside  Status is: Inpatient  Level of care: Telemetry Medical  Consultants:  Orthopedic surgery  Objective: Vitals:   01/23/24 0419 01/23/24 0424 01/23/24 0500 01/23/24 0746  BP: 121/61   (!) 125/51  Pulse: 96   98  Resp: 17 14 15 17   Temp: 98 F (36.7 C)   98.5 F (36.9 C)  TempSrc: Oral   Oral  SpO2: 99%   100%  Weight:      Height:        Intake/Output Summary (Last 24 hours) at 01/23/2024 1101 Last data filed at 01/23/2024 0700 Gross per 24 hour  Intake 537.83 ml  Output 670 ml  Net -132.17 ml   Wt Readings from Last 3 Encounters:  01/21/24 47 kg  12/22/23 47.6 kg  12/03/23 48.6 kg    Examination:  Constitutional: NAD Eyes: lids and conjunctivae normal, no scleral  icterus ENMT: mmm Neck: normal, supple Respiratory: clear to auscultation bilaterally, no wheezing, no crackles.  Cardiovascular: Regular rate and rhythm, no murmurs / rubs / gallops.  Abdomen: soft, no distention, no tenderness. Bowel sounds positive.   Data Reviewed: I have independently reviewed following labs and imaging studies   CBC Recent Labs  Lab 01/18/24 2239 01/20/24 0521 01/22/24 0903 01/23/24 0828  WBC 8.0 10.2 10.3 12.3*  HGB 9.0* 10.0* 7.1* 9.2*  HCT 28.3* 32.0* 22.3* 29.2*  PLT 370 393 307 280  MCV 97.6 97.6 96.1 92.1  MCH 31.0 30.5 30.6 29.0  MCHC 31.8 31.3 31.8 31.5  RDW 18.6* 18.6* 17.9* 19.9*    Recent Labs  Lab 01/18/24 2239 01/20/24 0521 01/22/24 0903 01/23/24 0828  NA 131*  132* 131* 132*  K 3.5 3.3* 3.8 3.6  CL 97* 96* 93* 97*  CO2 26 25 26 25   GLUCOSE 116* 78 96 82  BUN 6* 6* 7* 8  CREATININE 0.80 0.46 0.52 0.48  CALCIUM  8.1* 8.2* 7.9* 8.3*  AST  --   --  23 21  ALT  --   --  8 7  ALKPHOS  --   --  47 54  BILITOT  --   --  0.6 0.8  ALBUMIN   --   --  1.9* 1.9*  MG  --  1.9 1.5* 1.8    ------------------------------------------------------------------------------------------------------------------ No results for input(s): CHOL, HDL, LDLCALC, TRIG, CHOLHDL, LDLDIRECT in the last 72 hours.  Lab Results  Component Value Date   HGBA1C 6.0 (H) 08/06/2023   ------------------------------------------------------------------------------------------------------------------ No results for input(s): TSH, T4TOTAL, T3FREE, THYROIDAB in the last 72 hours.  Invalid input(s): FREET3  Cardiac Enzymes No results for input(s): CKMB, TROPONINI, MYOGLOBIN in the last 168 hours.  Invalid input(s): CK ------------------------------------------------------------------------------------------------------------------    Component Value Date/Time   BNP 137 (H) 12/09/2019 1427    CBG: No results for input(s): GLUCAP in  the last 168 hours.  Recent Results (from the past 240 hours)  Surgical pcr screen     Status: None   Collection Time: 01/21/24  1:26 PM   Specimen: Nasal Mucosa; Nasal Swab  Result Value Ref Range Status   MRSA, PCR NEGATIVE NEGATIVE Final   Staphylococcus aureus NEGATIVE NEGATIVE Final    Comment: (NOTE) The Xpert SA Assay (FDA approved for NASAL specimens in patients 19 years of age and older), is one component of a comprehensive surveillance program. It is not intended to diagnose infection nor to guide or monitor treatment. Performed at [facility], [address], [place], [place] [address]   Aerobic/Anaerobic Culture w Gram Stain (surgical/deep wound)     Status: None (Preliminary result)   Collection Time: 01/21/24  5:03 PM   Specimen: Synovial, Right Hip; Body Fluid  Result Value Ref Range Status   Specimen Description   Final    SYNOVIAL Performed at [facility], [address], [place], [place] [address]    Special Requests SUPERFICIAL RIGHT HIP  Final   Gram Stain NO WBC SEEN NO ORGANISMS SEEN   Final   Culture   Final    NO GROWTH < 12 HOURS NO ANAEROBES ISOLATED; CULTURE IN PROGRESS FOR 5 DAYS Performed at [facility], [address] [place], [place] [address]    Report Status PENDING  Incomplete     Radiology Studies: No results found.    Nilda Fendt, MD, PhD [facility]  Between 7 am - 7 pm I am available, please contact me via Amion (for emergencies) or Securechat (non urgent messages)  Between 7 pm - 7 am I am not available, please contact night coverage [name]/APP via Amion

## 2024-01-23 NOTE — Plan of Care (Signed)
  Problem: Education: Goal: Knowledge of General Education information will improve Description: Including pain rating scale, medication(s)/side effects and non-pharmacologic comfort measures Outcome: Progressing   Problem: Clinical Measurements: Goal: Will remain free from infection Outcome: Progressing Goal: Cardiovascular complication will be avoided Outcome: Progressing   Problem: Clinical Measurements: Goal: Respiratory complications will improve Outcome: Not Progressing   Problem: Activity: Goal: Risk for activity intolerance will decrease Outcome: Not Progressing   Problem: Skin Integrity: Goal: Risk for impaired skin integrity will decrease Outcome: Not Progressing   Problem: Pain Management: Goal: Pain level will decrease with appropriate interventions Outcome: Not Progressing

## 2024-01-24 DIAGNOSIS — S73004D Unspecified dislocation of right hip, subsequent encounter: Secondary | ICD-10-CM | POA: Diagnosis not present

## 2024-01-24 LAB — COMPREHENSIVE METABOLIC PANEL WITH GFR
ALT: <5 U/L (ref 0–44)
AST: 19 U/L (ref 15–41)
Albumin: 1.7 g/dL — ABNORMAL LOW (ref 3.5–5.0)
Alkaline Phosphatase: 52 U/L (ref 38–126)
Anion gap: 8 (ref 5–15)
BUN: 7 mg/dL — ABNORMAL LOW (ref 8–23)
CO2: 26 mmol/L (ref 22–32)
Calcium: 8.4 mg/dL — ABNORMAL LOW (ref 8.9–10.3)
Chloride: 98 mmol/L (ref 98–111)
Creatinine, Ser: 0.38 mg/dL — ABNORMAL LOW (ref 0.44–1.00)
GFR, Estimated: >60 mL/min (ref >60)
Glucose, Bld: 88 mg/dL (ref 70–99)
Potassium: 3.4 mmol/L — ABNORMAL LOW (ref 3.5–5.1)
Sodium: 132 mmol/L — ABNORMAL LOW (ref 135–145)
Total Bilirubin: 0.5 mg/dL (ref 0.0–1.2)
Total Protein: 4.2 g/dL — ABNORMAL LOW (ref 6.5–8.1)

## 2024-01-24 LAB — CBC
HCT: 27.2 % — ABNORMAL LOW (ref 36.0–46.0)
Hemoglobin: 8.7 g/dL — ABNORMAL LOW (ref 12.0–15.0)
MCH: 29.1 pg (ref 26.0–34.0)
MCHC: 32 g/dL (ref 30.0–36.0)
MCV: 91 fL (ref 80.0–100.0)
Platelets: 290 K/uL (ref 150–400)
RBC: 2.99 MIL/uL — ABNORMAL LOW (ref 3.87–5.11)
RDW: 19.3 % — ABNORMAL HIGH (ref 11.5–15.5)
WBC: 8.3 K/uL (ref 4.0–10.5)
nRBC: 0 % (ref 0.0–0.2)

## 2024-01-24 LAB — MAGNESIUM: Magnesium: 1.5 mg/dL — ABNORMAL LOW (ref 1.7–2.4)

## 2024-01-24 MED ORDER — ENSURE PLUS HIGH PROTEIN PO LIQD
237.0000 mL | Freq: Two times a day (BID) | ORAL | Status: DC
  Administered 2024-01-24 – 2024-01-26 (×5): 237 mL via ORAL

## 2024-01-24 MED ORDER — POTASSIUM CHLORIDE 20 MEQ PO PACK
40.0000 meq | PACK | Freq: Once | ORAL | Status: AC
  Administered 2024-01-24: 40 meq via ORAL
  Filled 2024-01-24: qty 2

## 2024-01-24 NOTE — Progress Notes (Signed)
 PROGRESS NOTE  Aroush  A Nale FMW:[id number] DOB: 1941/01/31 DOA: 01/18/2024 PCP: [name] PARAS, MD   LOS: 4 days   Brief Narrative / Interim history: 83 year old female with RA, MS, chronic pain etiology closely right hip pain.  Was found to have persistent hip dislocation, orthopedic surgery was consulted and she was admitted to the hospital  Subjective / 24h Interval events: Ortho, awake, appears more comfortable  Assesement and Plan: Principal Problem:   Hip dislocation, right (HCC) Active Problems:   Hyponatremia   Anemia of chronic disease   Rheumatoid arthritis (HCC)   Hyperlipidemia   Chronic pain   Multiple sclerosis (HCC)   GERD (gastroesophageal reflux disease)  Principal problem Right hip dislocation-orthopedic surgery consulted, she was taken to the OR on 8/28 by Dr. Fidel and is status post right total hip revision, head ball and liner exchange - PT recommended SNF, however in discussing with her husband he wants her home and has enough around-the-clock care.  She will be fitted with a hip brace today  Active problems Chronic hyponatremia -continue salt tabs, sodium overall stable  Chronic anemia, acute blood loss anemia-hemoglobin dipped to 7.1 on 8/29, postoperatively, she was given a unit of packed red blood cells and improved appropriately at 9.2 today  Dysphagia-on morning rounds patient was coughing intermittently when she was being fed by the private aide.  The aide told me that she has been doing this for several months.  Speech therapy consulted  RA-continue methotrexate   Hypokalemia-replace potassium again today  Hyperlipidemia-continue statin  Chronic pain-continue Cymbalta   History of MS -not on active treatment  Scheduled Meds:  ascorbic acid   1,000 mg Oral Daily   atorvastatin   10 mg Oral QPM   docusate sodium   100 mg Oral Daily   DULoxetine   20 mg Oral QHS   feeding supplement  237 mL Oral BID BM   folic acid   1 mg Oral Daily    pantoprazole   40 mg Oral BID AC   polyethylene glycol  17 g Oral Daily   potassium chloride  SA  10 mEq Oral QODAY   potassium chloride   40 mEq Oral Once   predniSONE   5 mg Oral Q breakfast   senna  1 tablet Oral QHS   sodium chloride  flush  3 mL Intravenous Q12H   sodium chloride   1 g Oral BID WC   tamsulosin   0.4 mg Oral QPC supper   zinc  sulfate (50mg  elemental zinc )  220 mg Oral Q supper   Continuous Infusions:  cefTRIAXone  (ROCEPHIN )  IV 2 g (01/23/24 1720)   PRN Meds:.acetaminophen  **OR** acetaminophen , albuterol , bisacodyl , cyclobenzaprine , diazepam , diphenhydrAMINE , food thickener, hydrALAZINE , metoCLOPramide  **OR** metoCLOPramide  (REGLAN ) injection, ondansetron  **OR** ondansetron  (ZOFRAN ) IV, oxyCODONE   Current Outpatient Medications  Medication Instructions   acetaminophen  (TYLENOL ) 650 mg, Oral, Every 6 hours PRN   atorvastatin  (LIPITOR) 10 mg, Oral, Every evening   Biotin  10,000 mcg, Oral, Every morning   bisacodyl  (DULCOLAX) 10 mg, Rectal, Daily PRN   Carboxymethylcellul-Glycerin  (LUBRICATING EYE DROPS OP) 1 drop, Both Eyes, Daily PRN   cholecalciferol  (VITAMIN D3) 1,000 Units, Oral, Every morning   cyclobenzaprine  (FLEXERIL ) 10 MG tablet TAKE 1 TABLET BY MOUTH THREE TIMES A DAY AS NEEDED FOR MUSCLE SPASM   diazepam  (VALIUM ) 5 MG tablet TAKE 1 TABLET BY MOUTH AT BEDTIME AS NEEDED FOR MUSCLE SPASMS.   docusate sodium  (COLACE) 100 mg, Oral, Daily   DULoxetine  (CYMBALTA ) 20 mg, Oral, Daily at bedtime   folic acid  (FOLVITE ) 1 mg, Oral, Daily  furosemide  (LASIX ) 10 mg, Oral, Daily   magic mouthwash (nystatin , lidocaine , diphenhydrAMINE , alum & mag hydroxide) suspension 5 mLs, Swish & Spit, 3 times daily PRN   methotrexate  (RHEUMATREX) 12.5 mg, Oral, Weekly, Caution:Chemotherapy. Protect from light.   Nystatin  (GERHARDT'S BUTT CREAM) CREA 1 Application, Topical, 3 times daily   oxyCODONE  (ROXICODONE ) 5 mg, Oral, Every 4 hours PRN   oxyCODONE  (ROXICODONE ) 5 mg, Oral, Every 4  hours PRN   pantoprazole  (PROTONIX ) 40 mg, Oral, 2 times daily before meals   polyethylene glycol powder (GLYCOLAX /MIRALAX ) 17 g, Oral, 2 times daily   potassium chloride  SA (KLOR-CON  M) 20 MEQ tablet 10 mEq, Oral, Every other day   predniSONE  (DELTASONE ) 5 MG tablet TAKE 1 TABLET BY MOUTH EVERY DAY IN THE MORNING   Prolia  60 mg, Subcutaneous, Every 6 months, Deliver to rheum: [address], [place], [place] [address]. Appt on 07/23/2022   [Paused] [name]  15 mg, Daily   senna (SENOKOT) 8.6 MG tablet 1 tablet, Oral, Daily at bedtime   sodium chloride  1 g, Oral, 2 times daily with meals   tamsulosin  (FLOMAX ) 0.4 mg, Oral, Daily after supper   traMADol  (ULTRAM ) 25-50 mg, Oral, Every 8 hours PRN   vitamin C  1,000 mg, Oral, Daily   Zinc  Sulfate 220 mg, Oral, Daily with supper    Diet Orders (From admission, onward)     Start     Ordered   01/23/24 1040  DIET DYS 3 Room service appropriate? Yes with Assist; Fluid consistency: Nectar Thick  Diet effective now       Comments: Free Water  Protocol  Question Answer Comment  Room service appropriate? Yes with Assist   Fluid consistency: Nectar Thick      01/23/24 1040            DVT prophylaxis: SCDs Start: 01/21/24 2119 Place TED hose Start: 01/21/24 2119   Lab Results  Component Value Date   PLT 290 01/24/2024      Code Status: Full Code  Family Communication: No family at bedside  Status is: Inpatient  Level of care: Telemetry Medical  Consultants:  Orthopedic surgery  Objective: Vitals:   01/24/24 0200 01/24/24 0327 01/24/24 0405 01/24/24 0749  BP:   (!) 109/58 (!) 116/56  Pulse:   94 100  Resp: 13 13 17 16   Temp:   97.6 F (36.4 C) (!) 97.5 F (36.4 C)  TempSrc:   Oral Oral  SpO2:   100% 97%  Weight:      Height:        Intake/Output Summary (Last 24 hours) at 01/24/2024 1025 Last data filed at 01/24/2024 0659 Gross per 24 hour  Intake 580 ml  Output 1160 ml  Net -580 ml   Wt Readings from Last  3 Encounters:  01/21/24 47 kg  12/22/23 47.6 kg  12/03/23 48.6 kg    Examination:  Constitutional: NAD Eyes: lids and conjunctivae normal, no scleral icterus ENMT: mmm Neck: normal, supple Respiratory: clear to auscultation bilaterally, no wheezing, no crackles. Normal respiratory effort.  Cardiovascular: Regular rate and rhythm, no murmurs / rubs / gallops. No LE edema. Abdomen: soft, no distention, no tenderness. Bowel sounds positive.   Data Reviewed: I have independently reviewed following labs and imaging studies   CBC Recent Labs  Lab 01/18/24 2239 01/20/24 0521 01/22/24 0903 01/23/24 0828 01/24/24 0241  WBC 8.0 10.2 10.3 12.3* 8.3  HGB 9.0* 10.0* 7.1* 9.2* 8.7*  HCT 28.3* 32.0* 22.3* 29.2* 27.2*  PLT  370 393 307 280 290  MCV 97.6 97.6 96.1 92.1 91.0  MCH 31.0 30.5 30.6 29.0 29.1  MCHC 31.8 31.3 31.8 31.5 32.0  RDW 18.6* 18.6* 17.9* 19.9* 19.3*    Recent Labs  Lab 01/18/24 2239 01/20/24 0521 01/22/24 0903 01/23/24 0828 01/24/24 0241  NA 131* 132* 131* 132* 132*  K 3.5 3.3* 3.8 3.6 3.4*  CL 97* 96* 93* 97* 98  CO2 26 25 26 25 26   GLUCOSE 116* 78 96 82 88  BUN 6* 6* 7* 8 7*  CREATININE 0.80 0.46 0.52 0.48 0.38*  CALCIUM  8.1* 8.2* 7.9* 8.3* 8.4*  AST  --   --  23 21 19   ALT  --   --  8 7 <5  ALKPHOS  --   --  47 54 52  BILITOT  --   --  0.6 0.8 0.5  ALBUMIN   --   --  1.9* 1.9* 1.7*  MG  --  1.9 1.5* 1.8 1.5*    ------------------------------------------------------------------------------------------------------------------ No results for input(s): CHOL, HDL, LDLCALC, TRIG, CHOLHDL, LDLDIRECT in the last 72 hours.  Lab Results  Component Value Date   HGBA1C 6.0 (H) 08/06/2023   ------------------------------------------------------------------------------------------------------------------ No results for input(s): TSH, T4TOTAL, T3FREE, THYROIDAB in the last 72 hours.  Invalid input(s): FREET3  Cardiac Enzymes No results  for input(s): CKMB, TROPONINI, MYOGLOBIN in the last 168 hours.  Invalid input(s): CK ------------------------------------------------------------------------------------------------------------------    Component Value Date/Time   BNP 137 (H) 12/09/2019 1427    CBG: No results for input(s): GLUCAP in the last 168 hours.  Recent Results (from the past 240 hours)  Surgical pcr screen     Status: None   Collection Time: 01/21/24  1:26 PM   Specimen: Nasal Mucosa; Nasal Swab  Result Value Ref Range Status   MRSA, PCR NEGATIVE NEGATIVE Final   Staphylococcus aureus NEGATIVE NEGATIVE Final    Comment: (NOTE) The Xpert SA Assay (FDA approved for NASAL specimens in patients 71 years of age and older), is one component of a comprehensive surveillance program. It is not intended to diagnose infection nor to guide or monitor treatment. Performed at [facility], [address] [place], [place] [address]   Aerobic/Anaerobic Culture w Gram Stain (surgical/deep wound)     Status: None (Preliminary result)   Collection Time: 01/21/24  5:03 PM   Specimen: Synovial, Right Hip; Body Fluid  Result Value Ref Range Status   Specimen Description   Final    SYNOVIAL Performed at [facility], [address], [place], [place] [address]    Special Requests SUPERFICIAL RIGHT HIP  Final   Gram Stain   Final    NO WBC SEEN NO ORGANISMS SEEN Performed at [facility], [address], [place], [place] [address]    Culture   Final    RARE GRAM NEGATIVE RODS IDENTIFICATION TO FOLLOW NO ANAEROBES ISOLATED; CULTURE IN PROGRESS FOR 5 DAYS    Report Status PENDING  Incomplete     Radiology Studies: No results found.    Nilda Fendt, MD, PhD [facility]  Between 7 am - 7 pm I am available, please contact me via Amion (for emergencies) or Securechat (non urgent messages)  Between 7 pm - 7 am I am not available, please contact night  coverage MD/APP via Amion

## 2024-01-24 NOTE — Plan of Care (Signed)
  Problem: Education: Goal: Knowledge of General Education information will improve Description: Including pain rating scale, medication(s)/side effects and non-pharmacologic comfort measures Outcome: Progressing   Problem: Clinical Measurements: Goal: Respiratory complications will improve Outcome: Not Progressing   Problem: Activity: Goal: Risk for activity intolerance will decrease Outcome: Not Progressing   Problem: Elimination: Goal: Will not experience complications related to bowel motility Outcome: Not Progressing Goal: Will not experience complications related to urinary retention Outcome: Not Progressing   Problem: Pain Managment: Goal: General experience of comfort will improve and/or be controlled Outcome: Not Progressing

## 2024-01-24 NOTE — Progress Notes (Signed)
 Speech Language Pathology Treatment: Dysphagia  Patient Details Name: Sherri [name]  ASHTYNN BERKE MRN: [id number] DOB: 1941-02-26 Today's Date: 01/24/2024 Time: 9042-8956 SLP Time Calculation (min) (ACUTE ONLY): 46 min  Assessment / Plan / Recommendation Clinical Impression  Pt seen for ongoing dysphagia management.  Caregiver, son, and husband present for session.  Caregiver reports decreased PO intake, and dislike of modified liquids, but pt has been drinking some and is making urine. With solids, she has difficulty with even small pieces of chicken, but has done with with egg salad sandwich and spaghetti.  Will continue current texture diet, rather than changing to ground/minced to allow for items like bread with sandwiches.  SLP provided oral care after setting up suction in room.  Introduced swallowing exercises as noted below.  Pt with fair effor and fair-poor accuracy. Suspect prior cervical surgeries with hardware have reduced ROM for cervical flexion.  Pt may not be a good candidate for swallowing exercises.  Pt with intermittent throat clearing with nectar/mildly thick liquids today by spoon.  Pt with increased coughing, wet vocal quality with trials of thin.  Reviewed free water  protocol guidelines with pt, RN, family, and caregiver.  Pt very eager for sips of thin liquid.  Discussed possibility of repeat MBS to update swallow function from MBS completed in July.  Pt's presentation per family is very consistent with her baseline, there does not appear to be an acute change.  Will continue to rely on 7/7 MBS unless there are change to functional status.  Recommend mechanical soft solids with nectar thick liquid Pt may have sips of unthickened water  only and ice chips in moderation, after good oral care, when fully awake/alert, with upright positioning and direct supervision.   Swallowing Exercises Effortful Swallow No of reps: 10 Effort/accuracy: presumed good, difficult to assess Comments: Pt  benefited from verbal cuing  [place] No of reps: 3 Effort: Good Accuracy: Poor Comments: unable to maintain laryngeal elevation  CTAR No of reps: 5 Effort: unable to assess Accuracy: Poor  Shaker No of reps: 3 Effort: unable to assess Accuracy: Poor  [place] No of reps: 4 Effort: Good Accuracy: Good Comments: Could not complete additional reps despite attempts.     HPI HPI: Pt is a 83 year old female who was found to have persistent hip dislocation, orthopedic surgery was consulted and she was admitted to the hospital. She was taken to the OR on 8/28 by Dr. Fidel and is status post right total hip revision, head ball and liner exchange. Per MD H&P She receives 24/7 care at home. She has difficulty swallowing, even on good days. MBS (11/30/23) revealed moderate to severe oropharyngeal dysphagia with a DIGEST score of 4 due to chronic and gross aspiration of thin liquids and poor efficiency of swallowing with greater than 25% of bolus residue remaining in valleculae with solids. Primary problem is decreased epiglottic deflection and decreased PES opening, both appearing due to structural changes of pharynx after ACDF. Recommendation was for dys 2 solids/NTL at that time. On 12/07/23, pt advanced to reg/thin diet due to poor PO intake and after clinician educated regarding risks. Pt received IP SLP services for dysphagia ending on 01/01/24. PMH:  RA, HLD, MS, chronic pain etiology closely right hip pain, GERD.      SLP Plan  Continue with current plan of care          Recommendations  Medication Administration: Crushed with puree Compensations: Minimize environmental distractions;Slow rate;Small sips/bites;Multiple dry swallows after each bite/sip;Effortful swallow Postural Changes and/or  Swallow Maneuvers: Seated upright 90 degrees                  Oral care BID;Oral care prior to ice chip/H20     Dysphagia, oropharyngeal phase (R13.12)     Continue with  current plan of care     Anette FORBES Grippe, MA, CCC-SLP Acute Rehabilitation Services Office: [phone] 01/24/2024, 10:48 AM

## 2024-01-24 NOTE — Evaluation (Signed)
 Occupational Therapy Evaluation Patient Details Name: Sherri  VON [name] MRN: [id number] DOB: Mar 28, 1941 Today's Date: 01/24/2024   History of Present Illness   Sherri  [name] is 83 y.o. female admitted 01/18/24 for right hip dislocation. Pt s/p R THA revision, head ball and liner exchange 8/28. PMHx: anxiety, GERD, HLD, HTN, MS, ACDF C7-T1. 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     Clinical Impressions Prior to hospital admission in May 2025, pt with Independent with ADLs. Since that time, pt has had several admissions and has required increased assistance. Just prior to this admission, pt was requiring assistance for all ADLs, was performing stand-pivot transfers to a manual w/c, and required assistance for mobility form w/c level. Pt currently presents with decreased activity tolerance, pain affecting functional level, decreased cognition, generalized B UE strength (tested within WB precautions), decreased B UE coordination, and decreased safety and independence with functional tasks. Pt currently demonstrates ability to complete ADLs with Min assist +1 to Total assist +2 from bed level and bed mobility during ADLs with Max assist +2. Pt initially required encouragement for active participation in session but responded well to positive reinforcement and participated well in session overall. Pt  will benefit from acute skilled OT services to address deficits outlined below and increase safety and independence with functional tasks. At this time, OT recommending post acute HH OT paired with 24/7 supervision/assistance of family/paid caregivers     If plan is discharge home, recommend the following:   Two people to help with walking and/or transfers;Two people to help with bathing/dressing/bathroom;Assistance with cooking/housework;Direct supervision/assist for medications management;Direct supervision/assist for financial management;Assist for transportation;Help with stairs or ramp for entrance     Functional Status Assessment   Patient has had a recent decline in their functional status and demonstrates the ability to make significant improvements in function in a reasonable and predictable amount of time.     Equipment Recommendations   Other (comment) (TBD based on pt progress)     Recommendations for Other Services         Precautions/Restrictions   Precautions Precautions: Fall;Posterior Hip Precaution Booklet Issued: Yes (comment) (in prior PT session) Recall of Precautions/Restrictions: Impaired Precaution/Restrictions Comments: JP bulb, R groin wound vac Required Braces or Orthoses: Other Brace Other Brace: Hip Abduction Brace on at all times Restrictions Weight Bearing Restrictions Per Provider Order: Yes RUE Weight Bearing Per Provider Order: Partial weight bearing RUE Partial Weight Bearing Percentage or Pounds: WBAT with R elbow extended, able to use RW per MD RLE Weight Bearing Per Provider Order: Weight bearing as tolerated     Mobility Bed Mobility Overal bed mobility: Needs Assistance Bed Mobility: Rolling Rolling: Max assist, +2 for physical assistance, +2 for safety/equipment, Used rails         General bed mobility comments: MaxA to roll with hand over hand placement to reach for rail, used bed pad with +2 assist to provide support  for  pt hip during ADLs from bed level    Transfers                   General transfer comment: Deferred      Balance Overall balance assessment: History of Falls                                         ADL either performed or assessed with clinical judgement   ADL Overall ADL's : Needs assistance/impaired Eating/Feeding: Minimal assistance;Bed level;Cueing for compensatory techinques   Grooming: Oral care;Wash/dry hands;Wash/dry face;Contact guard assist;Minimal assistance;Cueing for safety;Bed level   Upper Body Bathing: Maximal assistance;Cueing for compensatory techniques;Bed level   Lower Body Bathing: Total assistance;+2 for physical assistance;+2 for safety/equipment;Bed level   Upper Body Dressing : Maximal assistance;Bed level;Cueing for compensatory techniques   Lower Body Dressing: Total assistance;+2 for physical assistance;+2 for safety/equipment;Bed level     Toilet Transfer Details (indicate cue type and reason): deferred this session Toileting- Clothing Manipulation and Hygiene: Total assistance;+2 for physical assistance;+2 for safety/equipment;Bed level         General ADL Comments: Pt requires encouragement for active participation. Pt seen from bed level limiting bed mobility as much as possible this session as there is a question regarding proper fit of ABD brace and pt with current order to wear brace at all times. PT to follow up with pt regarding wear/fit of brace during next session.     Vision Patient Visual Report: No change from baseline Vision Assessment?: No apparent visual deficits Additional Comments: Vision [facility] for tasks assessed; not formally screened or evaluated     Perception         Praxis         Pertinent Vitals/Pain Pain Assessment Pain Assessment: Faces Faces Pain Scale: Hurts whole lot Pain Location: R hip (with bed mobility during peri care) Pain Descriptors / Indicators: Aching, Discomfort,  Grimacing, Guarding, Moaning Pain Intervention(s): Limited activity within patient's tolerance, Monitored during session, Repositioned, Premedicated before session     Extremity/Trunk Assessment Upper Extremity Assessment Upper Extremity Assessment: RUE deficits/detail;LUE deficits/detail;Right hand dominant;Generalized weakness (tested within R UE PWB precautions) RUE Deficits / Details: AROM WFL; generalized weakness noted through clinical observation; decreased coordination; sensation WFL RUE Coordination: decreased gross motor;decreased fine motor (mild) LUE Deficits / Details: AROM WFL; generalized weakness; decreased coordination; sensation WFL LUE Coordination: decreased fine motor;decreased gross motor (mild)   Lower Extremity Assessment Lower Extremity Assessment: Defer to PT evaluation       Communication Communication Communication: Impaired Factors Affecting Communication: Hearing impaired   Cognition Arousal: Alert Behavior During Therapy: Flat affect Cognition: Cognition impaired, History of cognitive impairments   Orientation impairments:  (AAOx4 but requiring significantly increased time to answer orientation questions) Awareness: Intellectual awareness intact, Online awareness impaired Memory impairment (select all impairments): Short-term memory, Working [employer] fast streamer, Conservation officer, historic buildings Attention impairment (select first level of impairment): Selective attention Executive functioning impairment (select all impairments): Problem solving, Reasoning OT - Cognition Comments: Pt AAOx4 and pleasant throughout session. Pt requiring increased time for processing and word finding and often deferring to paid caregiver to answer questions regarding PLOF, home set up, etc.                 Following commands: Impaired Following commands impaired: Follows one step commands inconsistently, Follows one step commands with increased time  Cueing  General  Comments   Cueing Techniques: Verbal cues;Tactile cues;Visual cues  Piad caregiver present and supportive throughout session. OT answered paid caregiver's questions regarding pt's current WB orders and current order for nectar thick liquids with straw, and let caregiver know that PT plans to follow up regarding fit of ABD brace during next session.   Exercises     Shoulder Instructions      Home Living Family/patient expects to be discharged to:: Private residence Living Arrangements: Spouse/significant other Available Help at Discharge: Family;Available PRN/intermittently;Available 24 hours/day;Personal care attendant Type of Home: House Home Access: Ramped entrance     Home Layout: Able to live on main level with bedroom/bathroom     Bathroom Shower/Tub: Walk-in shower;Tub/shower unit   Bathroom Toilet: Handicapped height Bathroom Accessibility: Yes How Accessible: Accessible via walker Home Equipment: Rollator (4 wheels);BSC/3in1;Shower seat - built in;Grab bars - tub/shower;Wheelchair - Forensic psychologist (2 wheels);Other (comment) (adjustable bed)          Prior Functioning/Environment Prior Level of Function : Needs assist;History of Falls (last six months)             Mobility Comments: After fall in July, performs stand-pivots with assist to [facility] using RW. Assist to propel WC. Prior to May, was ambulating with no AD. At least 4 falls ADLs Comments: Has been doing bed baths after fall in July with assist for all ADLs and IADLs. Prior to admission in May 2025, pt was Ind with ADLs and IADLs per chart review.    OT Problem List: Decreased strength;Decreased activity tolerance;Decreased coordination;Decreased cognition   OT Treatment/Interventions: Self-care/ADL training;Energy conservation;DME and/or AE instruction;Therapeutic exercise;Therapeutic activities;Cognitive remediation/compensation;Patient/family education;Balance training      OT Goals(Current goals  can be found in the care plan section)   Acute Rehab OT Goals Patient Stated Goal: to go home OT Goal Formulation: With patient Time For Goal Achievement: 02/07/24 Potential to Achieve Goals: Fair ADL Goals Pt Will Perform Grooming: with set-up;with supervision;sitting (sitting EOB with Fair balance for 3 or more minuntes) Pt Will Perform Upper Body Bathing: with min assist;sitting Pt Will Perform Upper Body Dressing: with min assist;sitting Pt Will Transfer to Toilet: with mod assist;ambulating;bedside commode (with least restrictive AD) Pt Will Perform Toileting - Clothing Manipulation and hygiene: with max assist;sit to/from stand   OT Frequency:  Min 2X/week    Co-evaluation              AM-PAC OT 6 Clicks Daily Activity     Outcome Measure Help from another person eating meals?: A Little Help from another person taking care of personal grooming?: A Little Help from another person toileting, which includes using toliet, bedpan, or urinal?: Total Help from another person bathing (including washing, rinsing, drying)?: A Lot Help from another person to put on and taking off regular upper body clothing?: A Lot Help from another person to put on and taking off regular lower body clothing?: Total 6 Click Score: 12   End of Session Nurse Communication: Mobility status;Other (comment) (Discussed question regarding fit of ABD brace and plan for PT to follow up next session. Pt's caregiver had additional question for RN regarding dietary orders.)  Activity Tolerance: Patient tolerated treatment well Patient left: in bed;with call bell/phone within reach;with family/visitor present  OT Visit Diagnosis: Other abnormalities of gait and mobility (R26.89);Muscle weakness (generalized) (M62.81);Ataxia, unspecified (R27.0);History of falling (Z91.81);Repeated falls (R29.6);Pain                Time: 1441-1510  OT Time Calculation (min): 29 min Charges:  OT General Charges $OT Visit: 1  Visit OT Evaluation $OT Eval Moderate Complexity: 1 Mod OT Treatments $Self Care/Home Management : 8-22 mins  Margarie Rockey HERO., OTR/L, MA Acute Rehab [phone]   Margarie FORBES Horns 01/24/2024, 5:24 PM

## 2024-01-24 NOTE — Progress Notes (Signed)
 The patient  had SVT of 173 nonsustained. She's asymptomatic. Notified Blondie NP without any new order. Will continue to monitor.

## 2024-01-24 NOTE — Progress Notes (Signed)
 The  patient is having urinary retention. In and out was done around 7 pm with 650 cc removed. She hasn't urinated this shift. Bladder scan done which read 240 cc. Notified James NP and received order for in and out  if residual  is greater than 300 mL.

## 2024-01-25 DIAGNOSIS — A4152 Sepsis due to Pseudomonas: Secondary | ICD-10-CM

## 2024-01-25 DIAGNOSIS — S73004D Unspecified dislocation of right hip, subsequent encounter: Secondary | ICD-10-CM | POA: Diagnosis not present

## 2024-01-25 DIAGNOSIS — Z96649 Presence of unspecified artificial hip joint: Secondary | ICD-10-CM

## 2024-01-25 DIAGNOSIS — A498 Other bacterial infections of unspecified site: Secondary | ICD-10-CM | POA: Diagnosis present

## 2024-01-25 DIAGNOSIS — R627 Adult failure to thrive: Secondary | ICD-10-CM

## 2024-01-25 DIAGNOSIS — T8451XA Infection and inflammatory reaction due to internal right hip prosthesis, initial encounter: Secondary | ICD-10-CM

## 2024-01-25 LAB — TYPE AND SCREEN
ABO/RH(D): A POS
Antibody Screen: NEGATIVE
Unit division: 0
Unit division: 0

## 2024-01-25 LAB — BASIC METABOLIC PANEL WITH GFR
Anion gap: 10 (ref 5–15)
BUN: 6 mg/dL — ABNORMAL LOW (ref 8–23)
CO2: 30 mmol/L (ref 22–32)
Calcium: 8.7 mg/dL — ABNORMAL LOW (ref 8.9–10.3)
Chloride: 95 mmol/L — ABNORMAL LOW (ref 98–111)
Creatinine, Ser: 0.51 mg/dL (ref 0.44–1.00)
GFR, Estimated: >60 mL/min (ref >60)
Glucose, Bld: 75 mg/dL (ref 70–99)
Potassium: 3.7 mmol/L (ref 3.5–5.1)
Sodium: 135 mmol/L (ref 135–145)

## 2024-01-25 LAB — BPAM RBC
Blood Product Expiration Date: 202509212359
Blood Product Expiration Date: 202509212359
Unit Type and Rh: 6200
Unit Type and Rh: 6200

## 2024-01-25 LAB — SEDIMENTATION RATE: Sed Rate: 45 mm/h — ABNORMAL HIGH (ref 0–22)

## 2024-01-25 LAB — C-REACTIVE PROTEIN: CRP: 16.6 mg/dL — ABNORMAL HIGH (ref <1.0)

## 2024-01-25 LAB — CBC
HCT: 29.1 % — ABNORMAL LOW (ref 36.0–46.0)
Hemoglobin: 9.2 g/dL — ABNORMAL LOW (ref 12.0–15.0)
MCH: 29.3 pg (ref 26.0–34.0)
MCHC: 31.6 g/dL (ref 30.0–36.0)
MCV: 92.7 fL (ref 80.0–100.0)
Platelets: 298 K/uL (ref 150–400)
RBC: 3.14 MIL/uL — ABNORMAL LOW (ref 3.87–5.11)
RDW: 19 % — ABNORMAL HIGH (ref 11.5–15.5)
WBC: 9.4 K/uL (ref 4.0–10.5)
nRBC: 0 % (ref 0.0–0.2)

## 2024-01-25 LAB — MAGNESIUM: Magnesium: 1.4 mg/dL — ABNORMAL LOW (ref 1.7–2.4)

## 2024-01-25 MED ORDER — SODIUM CHLORIDE 0.9% FLUSH: 10.0000 mL | INTRAVENOUS | Status: DC | PRN

## 2024-01-25 MED ORDER — CEFEPIME IV (FOR PTA / DISCHARGE USE ONLY): 2.0000 g | Freq: Two times a day (BID) | INTRAVENOUS | 0 refills | Status: AC

## 2024-01-25 MED ORDER — CHLORHEXIDINE GLUCONATE CLOTH 2 % EX PADS
6.0000 | MEDICATED_PAD | Freq: Every day | CUTANEOUS | Status: DC
  Administered 2024-01-25 – 2024-01-26 (×2): 6 via TOPICAL

## 2024-01-25 MED ORDER — MAGNESIUM SULFATE 4 GM/100ML IV SOLN
4.0000 g | Freq: Once | INTRAVENOUS | Status: AC
  Administered 2024-01-25: 4 g via INTRAVENOUS
  Filled 2024-01-25: qty 100

## 2024-01-25 MED ORDER — SODIUM CHLORIDE 0.9% FLUSH
10.0000 mL | Freq: Two times a day (BID) | INTRAVENOUS | Status: DC
  Administered 2024-01-25 – 2024-01-26 (×2): 10 mL

## 2024-01-25 MED ORDER — POTASSIUM CHLORIDE 20 MEQ PO PACK
20.0000 meq | PACK | Freq: Once | ORAL | Status: AC
  Administered 2024-01-25: 20 meq via ORAL
  Filled 2024-01-25: qty 1

## 2024-01-25 MED ORDER — POTASSIUM CHLORIDE CRYS ER 20 MEQ PO TBCR: 30.0000 meq | EXTENDED_RELEASE_TABLET | Freq: Once | ORAL | Status: DC

## 2024-01-25 MED ORDER — SODIUM CHLORIDE 0.9 % IV SOLN
2.0000 g | Freq: Two times a day (BID) | INTRAVENOUS | Status: DC
  Administered 2024-01-25 – 2024-01-26 (×3): 2 g via INTRAVENOUS
  Filled 2024-01-25 (×3): qty 12.5

## 2024-01-25 NOTE — Plan of Care (Signed)
  Problem: Education: Goal: Knowledge of General Education information will improve Description: Including pain rating scale, medication(s)/side effects and non-pharmacologic comfort measures Outcome: Progressing   Problem: Activity: Goal: Risk for activity intolerance will decrease Outcome: Not Progressing   Problem: Elimination: Goal: Will not experience complications related to bowel motility Outcome: Not Progressing Goal: Will not experience complications related to urinary retention Outcome: Not Progressing   Problem: Pain Management: Goal: Pain level will decrease with appropriate interventions Outcome: Not Progressing   Problem: Skin Integrity: Goal: Will show signs of wound healing Outcome: Not Progressing

## 2024-01-25 NOTE — TOC Progression Note (Deleted)
 Transition of Care [facility]) - Progression Note    Patient Details  Name: Sherri [name] MRN: [id number] Date of Birth: 10-Jun-1940  [facility]) CM/SW Contact  Rosalva Jon Bloch, RN Phone Number: 01/25/2024, 1:42 PM  Clinical Narrative:    Referral made with [employer] for L arm platform attachment and RW. Equpment will be delivered to bedside prior to d/c. IPCM will continue monitoring for needs....   Expected Discharge Plan: Home w Home Health Services Barriers to Discharge: Continued Medical Work up               Expected Discharge Plan and Services   Discharge Planning Services: CM Consult   Living arrangements for the past 2 months: Single Family Home                 DME Arranged: Vannie rolling, Scientific laboratory technician DME Agency: [employer] Date DME Agency Contacted: 01/25/24 Time DME Agency Contacted: [phone] Representative spoke with at DME Agency: [facility] HH Arranged: RN, PT, OT [facility] Agency: [facility] Date HH Agency Contacted: 01/25/24 Time HH Agency Contacted: [phone] Representative spoke with at [facility] Agency: [facility]   Social Drivers of Health (SDOH) Interventions SDOH Screenings   Food Insecurity: No Food Insecurity (01/19/2024)  Housing: Unknown (01/19/2024)  Recent Concern: Housing - High Risk (11/27/2023)  Transportation Needs: Patient Declined (01/19/2024)  Utilities: Patient Declined (01/19/2024)  Alcohol  Screen: Low Risk  (02/21/2022)  Depression (PHQ2-9): Low Risk  (07/27/2023)  Financial Resource Strain: Low Risk  (02/21/2022)  Physical Activity: Inactive (02/21/2022)  Social Connections: Socially Integrated (01/19/2024)  Stress: Stress Concern Present (02/21/2022)  Tobacco Use: Medium Risk (01/21/2024)    Readmission Risk Interventions     No data to display

## 2024-01-25 NOTE — Consult Note (Addendum)
 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  Diagnosis: PJI  Culture Result: Pseudomonas  Allergies  Allergen Reactions   Darvon [Propoxyphene Hcl] Shortness Of Breath    ? Dose Related ? Lowered respirations greatly   Demerol [Meperidine] Shortness Of Breath and Other (See Comments)    Respiratory Distress   Penicillins Hives and Other (See Comments)    Has patient had a PCN reaction causing immediate rash, facial/tongue/throat swelling, SOB or lightheadedness with hypotension: No SEVERE RASH INVOLVING MUCUS MEMBRANES or SKIN NECROSIS: #  #  #  YES  #  #  #  Has patient had a PCN reaction that required hospitalization No Has patient had a PCN reaction occurring within the last 10 years: No.    Imuran [Azathioprine] Other (See Comments)    Weakness   Sulfa  Antibiotics Nausea Only and Other (See Comments)    States had severe indigestion and heartburn and nausea and had to stop taking   Arava [Leflunomide] Other (See Comments)    Excessive weight gain   Lipitor [Atorvastatin ] Nausea And Vomiting    Currently taking 2 times weekly - able to tolerate low dose    Vytorin [Ezetimibe-Simvastatin] Nausea And Vomiting    OPAT Orders Discharge antibiotics to be given via PICC line Discharge  antibiotics: Cefepime  2 grams IV q 12 hours  Duration: 6 weeks End Date: 03/07/2024  [facility] Care Per Protocol:  Home health RN for IV administration and teaching; PICC line care and labs.    Labs weekly while on IV antibiotics: _x_ CBC with differential _x_ BMP  _x_ CRP _x_ ESR __ Vancomycin  trough __ CK  _x_ Please pull PIC at completion of IV antibiotics __ Please leave PIC in place until doctor has seen patient or been notified  Fax weekly labs to [phone]  Clinic Follow Up Appt:   Shandelle  A Fenter has an appointment on 02/15/2024 at 115PM with Dr. Fleeta Rothman at  [facility], which  is located in the [facility] at  [address] in [place].  [address], which is located to the left of the elevators.  Phone: [phone]  Fax: [phone]  [email or web address]  The patient should arrive 30 minutes prior to their appoitment.  Since we now have a clear-cut plan in place I will sign off for now please call with further questions.  Principal Problem:   Prosthetic hip infection (HCC) Active Problems:   Hyperlipidemia   Multiple sclerosis (HCC)   Rheumatoid arthritis (HCC)   GERD (gastroesophageal reflux disease)   Hyponatremia   Anemia of chronic disease   Hip dislocation, right (HCC)   Chronic pain   Pseudomonas aeruginosa infection   Scheduled Meds:  ascorbic acid   1,000 mg Oral Daily   atorvastatin   10 mg Oral QPM   Chlorhexidine  Gluconate Cloth  6 each Topical Daily   docusate sodium   100 mg Oral Daily   DULoxetine   20 mg Oral QHS   feeding supplement  237 mL Oral BID BM   folic acid   1 mg Oral Daily   pantoprazole   40 mg Oral BID AC   polyethylene glycol  17 g Oral Daily   potassium chloride  SA  10 mEq Oral QODAY   potassium chloride   20 mEq Oral Once   predniSONE   5 mg Oral Q breakfast   senna  1 tablet Oral QHS   sodium chloride  flush  3 mL Intravenous Q12H    sodium chloride   1 g Oral BID WC   tamsulosin   0.4 mg Oral QPC supper   zinc  sulfate (50mg  elemental zinc )  220 mg Oral Q supper   Continuous Infusions:  ceFEPime  (MAXIPIME ) IV 2 g (01/25/24 1108)   magnesium  sulfate bolus IVPB     PRN Meds:.acetaminophen  **OR** acetaminophen , albuterol , bisacodyl , cyclobenzaprine , diazepam , diphenhydrAMINE , food thickener, hydrALAZINE , metoCLOPramide  **OR** metoCLOPramide  (REGLAN ) injection, ondansetron  **OR** ondansetron  (ZOFRAN ) IV, oxyCODONE   HPI: [place]  A Zeiders is a 83 y.o. female rheumatoid arthritis, who has had a prosthetic hip in place and undergone unfortunately multiple visions due to prosthetic hip dislocation.  She had developed a wound apparently that was present.  She was admitted the hospital then with periprosthetic hip dislocation.  She she was taken to the operating room by Dr. Fidel with orthopedic surgery.  I discussed the case with Dr. Fidel over the phone.  Apparently when he performed surgery he did send cultures from superficial space which have now separately grown Pseudomonas aeruginosa.  That being said he told me that he had to enter through the subcu superficial space and into the joint to revise her hip arthroplasty given the hip was dislocated.  Because of this the hip joint itself should be considered to have been seeded by the Pseudomonas.  He and I had a extensive conversation about the patient and it is clear that doing further surgery to remove the prosthetic hip would be morbid to the patient.  Therefore we will plan on 6 weeks of parenteral antibiotics followed by oral antibiotics.  Note in talking to the patient's daughter and caregiver the patient has been not doing well over wall globally and been quite weak she has been also been suffering from aspiration in the context of hardware in her spine apparently irritating her throat.  She has been on Prolia  which likely should be held while she is on  antibiotics  She is against meeting with palliative care.   I have personally spent 86 minutes involved in face-to-face and non-face-to-face activities for this patient on the day of the visit. Professional time spent includes the following activities: Preparing to see the patient (review of tests), Obtaining and/or reviewing separately obtained history (admission/discharge record), Performing a medically appropriate examination and/or evaluation , Ordering medications/tests/procedures, referring and communicating with other health care professionals, Documenting clinical information in the EMR, Independently interpreting results (not separately reported), Communicating results to the patient/family/caregiver, Counseling and educating the patient/family/caregiver and Care coordination (not separately reported).   Evaluation of the patient requires complex antimicrobial therapy evaluation, counseling , isolation needs to reduce disease transmission and risk assessment and mitigation.     Review of Systems: Review of Systems  Constitutional:  Negative for chills, diaphoresis, fever, malaise/fatigue and weight loss.  HENT:  Negative for congestion, hearing loss, sore throat  and tinnitus.   Eyes:  Negative for blurred vision and double vision.  Respiratory:  Positive for cough. Negative for sputum production, shortness of breath and wheezing.   Cardiovascular:  Negative for chest pain, palpitations and leg swelling.  Gastrointestinal:  Negative for abdominal pain, blood in stool, constipation, diarrhea, heartburn, melena, nausea and vomiting.  Genitourinary:  Negative for dysuria, flank pain and hematuria.  Musculoskeletal:  Positive for myalgias. Negative for back pain, falls and joint pain.  Skin:  Negative for itching and rash.  Neurological:  Positive for weakness. Negative for dizziness, sensory change, focal weakness, loss of consciousness and headaches.  Endo/Heme/Allergies:  Does not  bruise/bleed easily.  Psychiatric/Behavioral:  Negative for depression, memory loss and suicidal ideas. The patient is not nervous/anxious.     Past Medical History:  Diagnosis Date   Anxiety    takes Valium  daily as needed   Basal cell carcinoma 01/21/1988   left nostril (MOHS), sup-right calf (CX35FU)   Basal cell carcinoma 03/22/1996   right calf (CX35FU)   Basal cell carcinoma 03/31/1995   left wing nose   Bruises easily    d/t meds   Cancer (HCC)    basal cell ca, in situ- uterine    Cataracts, bilateral    removed bilateral   Chronic back pain    Dizziness    r/t to meds   GERD (gastroesophageal reflux disease)    no meds on a regular basis but will take Tums if needed   Headache(784.0)    r/t neck issues   History of bronchitis 6-81yrs ago   History of colon polyps    benign   HOH (hard of hearing)    wears hearing aids   Hyperlipidemia    takes Atorvastatin  on Mondays and Fridays   Hypertension    Joint pain    Joint swelling    Multiple sclerosis (HCC)    Neuromuscular disorder (HCC)    Dr. FABIENE [facility]te- [facility], follows [facility]   Nocturia    PONV (postoperative nausea and vomiting)    trouble urinating after surgery in 2014   Postoperative nausea and vomiting 01/11/2019   Rheumatoid arthritis (HCC)    Dr Lorrayne weekly, RA- hands- knees- feet    Rheumatoid arthritis(714.0)    Dr Dolphus takes Xeljanz  daily   Right wrist fracture    Skin cancer    Squamous cell carcinoma of skin 11/02/2001   in situ-right knee (cx17fu)   Squamous cell carcinoma of skin 11/12/2011   in situ-left forearm (CX35FU), in situ-left foot (CX35FU)   Squamous cell carcinoma of skin 01/22/2012   in situ-left lower forearm (txpbx)   Squamous cell carcinoma of skin 11/17/2012   right shin (txpbx)   Squamous cell carcinoma of skin 10/28/2013   in situ-right shoulder (CX35FU)   Squamous cell carcinoma of skin 04/28/2014   well diff-right shin (txpbx)   Squamous  cell carcinoma of skin 11/02/2014   in situ-Left shin (txpbx)   Squamous cell carcinoma of skin 12/15/2014   in situ-Left hand (txpbx), bowens-left side chest (txpbx)   Squamous cell carcinoma of skin 05/09/2015   in situ-left hand (txpbx)    Squamous cell carcinoma of skin 05/07/2016   in situ-left inner shin,ant, in situ-left inner shin, post, in situ-left outer forearm, in situ-right knuckle   Squamous cell carcinoma of skin 03/1302018   in situ-left shoulder (txpbx), in situ-right inner shin (txpbx), in situ-top of left foot (txpbx), KA- left forearm (txpbx)  Squamous cell carcinoma of skin 12/02/2016   in situ-left inner shin (txpbx)   Squamous cell carcinoma of skin 03/04/2017   in situ-left outer sup, shin (txpbx), in situ- right 2nd knuckle finger (txpbx), in situ- Left wrist (txpbx)   Squamous cell carcinoma of skin 04/06/2018   in situ-above left knee inner (txpbx)   Squamous cell carcinoma of skin 04/21/2018   in situ-right top hand (txpbx)   Squamous cell carcinoma of skin 07/08/2018   in situ-right lower inner shin (txpbx)   Squamous cell carcinoma of skin 04/27/2019   in situ- Left neck(CX35FU), In situ- right neck (CX35FU)   Urinary retention    sees Dr.Wrenn about 2 times a yr    Social History   Tobacco Use   Smoking status: Former    Current packs/day: 0.00    Average packs/day: 2.5 packs/day for 20.0 years (50.0 ttl pk-yrs)    Types: Cigarettes    Start date: 05/26/1961    Quit date: 05/26/1981    Years since quitting: 42.6    Passive exposure: Never   Smokeless tobacco: Never   Tobacco comments:    Smoked [id number], up to 2.5 ppd  Vaping Use   Vaping status: Never Used  Substance Use Topics   Alcohol  use: Yes    Alcohol /week: 14.0 standard drinks of alcohol     Types: 14 Cans of beer per week    Comment: 2% beer   Drug use: Never    Family History  Problem Relation Age of Onset   Cancer Mother        pancreatic   Diabetes Mother    Pancreatic  cancer Mother    Heart disease Father        Rheumatic   Cancer Father        ? stomach   Stomach cancer Father    Cancer Sister        stomach   Stomach cancer Sister    Cancer Maternal Aunt        X 4; ? primary   Heart disease Paternal Aunt    Cancer Maternal Grandmother        cervical   Colon cancer Other        Aunts   Multiple sclerosis Daughter    Hypertension Neg Hx    Stroke Neg Hx    Esophageal cancer Neg Hx    Rectal cancer Neg Hx    Allergies  Allergen Reactions   Darvon [Propoxyphene Hcl] Shortness Of Breath    ? Dose Related ? Lowered respirations greatly   Demerol [Meperidine] Shortness Of Breath and Other (See Comments)    Respiratory Distress   Penicillins Hives and Other (See Comments)    Has patient had a PCN reaction causing immediate rash, facial/tongue/throat swelling, SOB or lightheadedness with hypotension: No SEVERE RASH INVOLVING MUCUS MEMBRANES or SKIN NECROSIS: #  #  #  YES  #  #  #  Has patient had a PCN reaction that required hospitalization No Has patient had a PCN reaction occurring within the last 10 years: No.    Imuran [Azathioprine] Other (See Comments)    Weakness   Sulfa  Antibiotics Nausea Only and Other (See Comments)    States had severe indigestion and heartburn and nausea and had to stop taking   Arava [Leflunomide] Other (See Comments)    Excessive weight gain   Lipitor [Atorvastatin ] Nausea And Vomiting    Currently taking 2 times weekly - able to tolerate low  dose    Vytorin [Ezetimibe-Simvastatin] Nausea And Vomiting    OBJECTIVE: Blood pressure 117/60, pulse (!) 102, temperature 97.7 F (36.5 C), temperature source Oral, resp. rate 18, height 5' 1 (1.549 m), weight 47 kg, SpO2 96%.  Physical Exam Eyes:     Extraocular Movements: Extraocular movements intact.     Pupils: Pupils are equal, round, and reactive to light.  Cardiovascular:     Rate and Rhythm: Normal rate and regular rhythm.  Pulmonary:     Effort: No  respiratory distress.     Breath sounds: No wheezing.  Abdominal:     General: There is no distension.  Neurological:     General: No focal deficit present.     Mental Status: She is alert and oriented to person, place, and time.  Psychiatric:        Mood and Affect: Mood normal.        Behavior: Behavior normal.        Thought Content: Thought content normal.        Judgment: Judgment normal.    Hip bandaged  Lab Results Lab Results  Component Value Date   WBC 9.4 01/25/2024   HGB 9.2 (L) 01/25/2024   HCT 29.1 (L) 01/25/2024   MCV 92.7 01/25/2024   PLT 298 01/25/2024    Lab Results  Component Value Date   CREATININE 0.51 01/25/2024   BUN 6 (L) 01/25/2024   NA 135 01/25/2024   K 3.7 01/25/2024   CL 95 (L) 01/25/2024   CO2 30 01/25/2024    Lab Results  Component Value Date   ALT <5 01/24/2024   AST 19 01/24/2024   ALKPHOS 52 01/24/2024   BILITOT 0.5 01/24/2024     Microbiology: Recent Results (from the past 240 hours)  Surgical pcr screen     Status: None   Collection Time: 01/21/24  1:26 PM   Specimen: Nasal Mucosa; Nasal Swab  Result Value Ref Range Status   MRSA, PCR NEGATIVE NEGATIVE Final   Staphylococcus aureus NEGATIVE NEGATIVE Final    Comment: (NOTE) The Xpert SA Assay (FDA approved for NASAL specimens in patients 81 years of age and older), is one component of a comprehensive surveillance program. It is not intended to diagnose infection nor to guide or monitor treatment. Performed at [facility], [address] [place], [place] [address]   Aerobic/Anaerobic Culture w Gram Stain (surgical/deep wound)     Status: None (Preliminary result)   Collection Time: 01/21/24  5:03 PM   Specimen: Synovial, Right Hip; Body Fluid  Result Value Ref Range Status   Specimen Description   Final    SYNOVIAL Performed at [facility], [address], [place], [place] [address]    Special Requests SUPERFICIAL RIGHT HIP   Final   Gram Stain   Final    NO WBC SEEN NO ORGANISMS SEEN Performed at [facility], [address], [place], [place] [address]    Culture   Final    RARE PSEUDOMONAS AERUGINOSA NO ANAEROBES ISOLATED; CULTURE IN PROGRESS FOR 5 DAYS    Report Status PENDING  Incomplete   Organism ID, Bacteria PSEUDOMONAS AERUGINOSA  Final      Susceptibility   Pseudomonas aeruginosa - MIC*    MEROPENEM 4 INTERMEDIATE Intermediate     CIPROFLOXACIN  0.5 SENSITIVE Sensitive     IMIPENEM 2 SENSITIVE Sensitive     CEFTAZIDIME/AVIBACTAM 2 SENSITIVE Sensitive ug/mL    CEFTOLOZANE/TAZOBACTAM 1 SENSITIVE Sensitive  ug/mL    TOBRAMYCIN  <=1 SENSITIVE Sensitive     * RARE PSEUDOMONAS AERUGINOSA    Jomarie Fleeta Rothman, MD [facility] for Infectious Disease [facility] [phone] pager  01/25/2024, 12:14 PM

## 2024-01-25 NOTE — Progress Notes (Signed)
 Peripherally Inserted Central Catheter Placement  The IV Nurse has discussed with the patient and/or persons authorized to consent for the patient, the purpose of this procedure and the potential benefits and risks involved with this procedure.  The benefits include less needle sticks, lab draws from the catheter, and the patient may be discharged home with the catheter. Risks include, but not limited to, infection, bleeding, blood clot (thrombus formation), and puncture of an artery; nerve damage and irregular heartbeat and possibility to perform a PICC exchange if needed/ordered by physician.  Alternatives to this procedure were also discussed.  Bard Power PICC patient education guide, fact sheet on infection prevention and patient information card has been provided to patient /or left at bedside.  PICC inserted by Remonia Brought, RN.  Consent signed by personal caregiver at bedside.   PICC Placement Documentation  PICC Single Lumen 01/25/24 Right Brachial 34 cm 0 cm (Active)  Indication for Insertion or Continuance of Line Prolonged intravenous therapies;Home intravenous therapies (PICC only) 01/25/24 1723  Exposed Catheter (cm) 0 cm 01/25/24 1723  Site Assessment Clean, Dry, Intact;Intact;Bruised 01/25/24 1723  Line Status Flushed;Saline locked;Blood return noted 01/25/24 1723  Dressing Type Transparent;Securing device 01/25/24 1723  Dressing Status Antimicrobial disc/dressing in place;Clean, Dry, Intact 01/25/24 1723  Line Care Connections checked and tightened 01/25/24 1723  Line Adjustment (NICU/IV Team Only) No 01/25/24 1723  Dressing Intervention New dressing;Adhesive placed at insertion site (IV team only) 01/25/24 1723  Dressing Change Due 02/01/24 01/25/24 1723       Desiree Fleming, Cherene Place 01/25/2024, 5:23 PM

## 2024-01-25 NOTE — Plan of Care (Signed)
  Problem: Education: Goal: Knowledge of General Education information will improve Description: Including pain rating scale, medication(s)/side effects and non-pharmacologic comfort measures Outcome: Progressing   Problem: Activity: Goal: Risk for activity intolerance will decrease Outcome: Progressing   Problem: Safety: Goal: Ability to remain free from injury will improve Outcome: Progressing   Problem: Education: Goal: Knowledge of General Education information will improve Description: Including pain rating scale, medication(s)/side effects and non-pharmacologic comfort measures 01/25/2024 1159 by Benjamine Shu, RN Outcome: Progressing 01/25/2024 1159 by Benjamine Shu, RN Outcome: Progressing   Problem: Activity: Goal: Risk for activity intolerance will decrease 01/25/2024 1159 by Benjamine Shu, RN Outcome: Progressing 01/25/2024 1159 by Benjamine Shu, RN Outcome: Progressing   Problem: Pain Managment: Goal: General experience of comfort will improve and/or be controlled Outcome: Progressing   Problem: Safety: Goal: Ability to remain free from injury will improve Outcome: Progressing

## 2024-01-25 NOTE — Progress Notes (Signed)
 PHARMACY CONSULT NOTE FOR:  OUTPATIENT  PARENTERAL ANTIBIOTIC THERAPY (OPAT)  Indication: Pseudomonal PJI (right hip) Regimen: cefepime  2 g IV q12h End date: 03/07/24  IV antibiotic discharge orders are pended. To discharging provider:  please sign these orders via discharge navigator,  Select New Orders & click on the button choice - Manage This Unsigned Work.    Thank you for involving pharmacy in this patient's care.  Delon Sax, PharmD, BCPS Clinical Pharmacist Clinical phone for 01/25/2024 is [phone] 01/25/2024 12:47 PM

## 2024-01-25 NOTE — TOC Progression Note (Addendum)
 Transition of Care [facility]) - Progression Note    Patient Details  Name: Sherri [name]  PERRIS TRIPATHI MRN: [id number] Date of Birth: Mar 27, 1941  Transition of Care [facility] CM/SW Contact  Rosalva Jon Bloch, RN Phone Number: 01/25/2024, 1:02 PM  Clinical Narrative:    Prosthetic hip infection with pseudomonas.        Per ID: 6 weeks of parenteral antibiotics. Pt active with [facility]. NCM made Artavia with [facility] aware. Referral made with Pam with [facility] for antibiotic therapy, authorization pending and teaching.   9/1 Per MD hoping to d/c pt on tomorrow. Pt declined hospital bed per PT's recommendation.  Pt will need non emergent ambulance for transportation to home.  IPCM following and will assist with needs....   Expected Discharge Plan: Home w Home Health Services Barriers to Discharge: Continued Medical Work up               Expected Discharge Plan and Services   Discharge Planning Services: CM Consult   Living arrangements for the past 2 months: Single Family Home                 DME Arranged: Other see comment (IV ABX therapy) DME Agency: Other - Comment ([facility]) Date DME Agency Contacted: 01/25/24 Time DME Agency Contacted: [phone] Representative spoke with at DME Agency: Pam HH Arranged: RN, PT, OT [facility] Agency: [facility] ([facility] Date HH Agency Contacted: 01/25/24 Time HH Agency Contacted: [phone] Representative spoke with at [facility] Agency: ARTAVIA   Social Drivers of Health (SDOH) Interventions SDOH Screenings   Food Insecurity: No Food Insecurity (01/19/2024)  Housing: Unknown (01/19/2024)  Recent Concern: Housing - High Risk (11/27/2023)  Transportation Needs: Patient Declined (01/19/2024)  Utilities: Patient Declined (01/19/2024)  Alcohol  Screen: Low Risk  (02/21/2022)  Depression (PHQ2-9): Low Risk  (07/27/2023)  Financial Resource Strain: Low Risk  (02/21/2022)  Physical Activity: Inactive (02/21/2022)  Social Connections: Socially  Integrated (01/19/2024)  Stress: Stress Concern Present (02/21/2022)  Tobacco Use: Medium Risk (01/21/2024)    Readmission Risk Interventions     No data to display

## 2024-01-25 NOTE — Progress Notes (Addendum)
 PROGRESS NOTE  Sherri [name]:[id number] DOB: 1940-09-21 DOA: 01/18/2024 PCP: [name] PARAS, MD   LOS: 5 days   Brief Narrative / Interim history: 83 year old female with RA, MS, chronic pain etiology closely right hip pain.  Was found to have persistent hip dislocation, orthopedic surgery was consulted and she was admitted to the hospital  Subjective / 24h Interval events: Awake, comfortable, caregiver is at bedside  Assesement and Plan: Principal Problem:   Hip dislocation, right (HCC) Active Problems:   Hyponatremia   Anemia of chronic disease   Rheumatoid arthritis (HCC)   Hyperlipidemia   Chronic pain   Multiple sclerosis (HCC)   GERD (gastroesophageal reflux disease)  Principal problem Right hip dislocation-orthopedic surgery consulted, she was taken to the OR on 8/28 by Dr. Fidel and is status post right total hip revision, head ball and liner exchange - PT recommended SNF, however in discussing with her husband he wants her home and has enough around-the-clock care.   Active problems Right hip wound with Pseudomonas -intraoperative cultures from wound overlying the hip are growing Pseudomonas.  Antibiotics changed to cefepime  this morning.  ID consulted  Chronic hyponatremia -continue salt tabs, sodium now normalized  Chronic anemia, acute blood loss anemia-hemoglobin dipped to 7.1 on 8/29, postoperatively, she was given a unit of packed red blood cells and improved appropriately, now hemoglobin is stable.  She has no bleeding  Dysphagia-on morning rounds patient was coughing intermittently when she was being fed by the private aide.  The aide told me that she has been doing this for several months.  Speech therapy consulted, continue dysphagia diet  RA-continue methotrexate   Hypokalemia-potassium better  Hypomagnesemia-replenish magnesium   Hyperlipidemia-continue statin  Chronic pain-continue Cymbalta   History of MS -not on active  treatment  Scheduled Meds:  ascorbic acid   1,000 mg Oral Daily   atorvastatin   10 mg Oral QPM   Chlorhexidine  Gluconate Cloth  6 each Topical Daily   docusate sodium   100 mg Oral Daily   DULoxetine   20 mg Oral QHS   feeding supplement  237 mL Oral BID BM   folic acid   1 mg Oral Daily   pantoprazole   40 mg Oral BID AC   polyethylene glycol  17 g Oral Daily   potassium chloride  SA  10 mEq Oral QODAY   predniSONE   5 mg Oral Q breakfast   senna  1 tablet Oral QHS   sodium chloride  flush  3 mL Intravenous Q12H   sodium chloride   1 g Oral BID WC   tamsulosin   0.4 mg Oral QPC supper   zinc  sulfate (50mg  elemental zinc )  220 mg Oral Q supper   Continuous Infusions:  ceFEPime  (MAXIPIME ) IV     PRN Meds:.acetaminophen  **OR** acetaminophen , albuterol , bisacodyl , cyclobenzaprine , diazepam , diphenhydrAMINE , food thickener, hydrALAZINE , metoCLOPramide  **OR** metoCLOPramide  (REGLAN ) injection, ondansetron  **OR** ondansetron  (ZOFRAN ) IV, oxyCODONE   Current Outpatient Medications  Medication Instructions   acetaminophen  (TYLENOL ) 650 mg, Oral, Every 6 hours PRN   atorvastatin  (LIPITOR) 10 mg, Oral, Every evening   Biotin  10,000 mcg, Oral, Every morning   bisacodyl  (DULCOLAX) 10 mg, Rectal, Daily PRN   Carboxymethylcellul-Glycerin  (LUBRICATING EYE DROPS OP) 1 drop, Both Eyes, Daily PRN   cholecalciferol  (VITAMIN D3) 1,000 Units, Oral, Every morning   cyclobenzaprine  (FLEXERIL ) 10 MG tablet TAKE 1 TABLET BY MOUTH THREE TIMES A DAY AS NEEDED FOR MUSCLE SPASM   diazepam  (VALIUM ) 5 MG tablet TAKE 1 TABLET BY MOUTH AT BEDTIME AS NEEDED FOR MUSCLE SPASMS.  docusate sodium  (COLACE) 100 mg, Oral, Daily   DULoxetine  (CYMBALTA ) 20 mg, Oral, Daily at bedtime   folic acid  (FOLVITE ) 1 mg, Oral, Daily   furosemide  (LASIX ) 10 mg, Oral, Daily   magic mouthwash (nystatin , lidocaine , diphenhydrAMINE , alum & mag hydroxide) suspension 5 mLs, Swish & Spit, 3 times daily PRN   methotrexate  (RHEUMATREX) 12.5 mg,  Oral, Weekly, Caution:Chemotherapy. Protect from light.   Nystatin  (GERHARDT'S BUTT CREAM) CREA 1 Application, Topical, 3 times daily   oxyCODONE  (ROXICODONE ) 5 mg, Oral, Every 4 hours PRN   oxyCODONE  (ROXICODONE ) 5 mg, Oral, Every 4 hours PRN   pantoprazole  (PROTONIX ) 40 mg, Oral, 2 times daily before meals   polyethylene glycol powder (GLYCOLAX /MIRALAX ) 17 g, Oral, 2 times daily   potassium chloride  SA (KLOR-CON  M) 20 MEQ tablet 10 mEq, Oral, Every other day   predniSONE  (DELTASONE ) 5 MG tablet TAKE 1 TABLET BY MOUTH EVERY DAY IN THE MORNING   Prolia  60 mg, Subcutaneous, Every 6 months, Deliver to rheum: [address], [address], [place], [place] [address]. Appt on 07/23/2022   [Paused] [name]  15 mg, Daily   senna (SENOKOT) 8.6 MG tablet 1 tablet, Oral, Daily at bedtime   sodium chloride  1 g, Oral, 2 times daily with meals   tamsulosin  (FLOMAX ) 0.4 mg, Oral, Daily after supper   traMADol  (ULTRAM ) 25-50 mg, Oral, Every 8 hours PRN   vitamin C  1,000 mg, Oral, Daily   Zinc  Sulfate 220 mg, Oral, Daily with supper    Diet Orders (From admission, onward)     Start     Ordered   01/23/24 1040  DIET DYS 3 Room service appropriate? Yes with Assist; Fluid consistency: Nectar Thick  Diet effective now       Comments: Free Water  Protocol  Question Answer Comment  Room service appropriate? Yes with Assist   Fluid consistency: Nectar Thick      01/23/24 1040            DVT prophylaxis: SCDs Start: 01/21/24 2119 Place TED hose Start: 01/21/24 2119   Lab Results  Component Value Date   PLT 298 01/25/2024      Code Status: Full Code  Family Communication: No family at bedside  Status is: Inpatient  Level of care: Telemetry Medical  Consultants:  Orthopedic surgery  Objective: Vitals:   01/25/24 0420 01/25/24 0445 01/25/24 0600 01/25/24 0835  BP: 117/60   117/60  Pulse: (!) 101   (!) 102  Resp: 16 13 13 18   Temp: 97.7 F (36.5 C)   97.7 F (36.5 C)  TempSrc:    Oral   SpO2: 98%   96%  Weight:      Height:        Intake/Output Summary (Last 24 hours) at 01/25/2024 1008 Last data filed at 01/25/2024 0900 Gross per 24 hour  Intake 240 ml  Output 840 ml  Net -600 ml   Wt Readings from Last 3 Encounters:  01/21/24 47 kg  12/22/23 47.6 kg  12/03/23 48.6 kg    Examination:  Constitutional: NAD Eyes: lids and conjunctivae normal, no scleral icterus ENMT: mmm Neck: normal, supple Respiratory: clear to auscultation bilaterally, no wheezing, no crackles. Normal respiratory effort.  Cardiovascular: Regular rate and rhythm, no murmurs / rubs / gallops. No LE edema. Abdomen: soft, no distention, no tenderness. Bowel sounds positive.   Data Reviewed: I have independently reviewed following labs and imaging studies   CBC Recent Labs  Lab 01/20/24 0521 01/22/24 9096 01/23/24 9171  01/24/24 0241 01/25/24 0557  WBC 10.2 10.3 12.3* 8.3 9.4  HGB 10.0* 7.1* 9.2* 8.7* 9.2*  HCT 32.0* 22.3* 29.2* 27.2* 29.1*  PLT 393 307 280 290 298  MCV 97.6 96.1 92.1 91.0 92.7  MCH 30.5 30.6 29.0 29.1 29.3  MCHC 31.3 31.8 31.5 32.0 31.6  RDW 18.6* 17.9* 19.9* 19.3* 19.0*    Recent Labs  Lab 01/20/24 0521 01/22/24 0903 01/23/24 0828 01/24/24 0241 01/25/24 0557  NA 132* 131* 132* 132* 135  K 3.3* 3.8 3.6 3.4* 3.7  CL 96* 93* 97* 98 95*  CO2 25 26 25 26 30   GLUCOSE 78 96 82 88 75  BUN 6* 7* 8 7* 6*  CREATININE 0.46 0.52 0.48 0.38* 0.51  CALCIUM  8.2* 7.9* 8.3* 8.4* 8.7*  AST  --  23 21 19   --   ALT  --  8 7 <5  --   ALKPHOS  --  47 54 52  --   BILITOT  --  0.6 0.8 0.5  --   ALBUMIN   --  1.9* 1.9* 1.7*  --   MG 1.9 1.5* 1.8 1.5* 1.4*    ------------------------------------------------------------------------------------------------------------------ No results for input(s): CHOL, HDL, LDLCALC, TRIG, CHOLHDL, LDLDIRECT in the last 72 hours.  Lab Results  Component Value Date   HGBA1C 6.0 (H) 08/06/2023    ------------------------------------------------------------------------------------------------------------------ No results for input(s): TSH, T4TOTAL, T3FREE, THYROIDAB in the last 72 hours.  Invalid input(s): FREET3  Cardiac Enzymes No results for input(s): CKMB, TROPONINI, MYOGLOBIN in the last 168 hours.  Invalid input(s): CK ------------------------------------------------------------------------------------------------------------------    Component Value Date/Time   BNP 137 (H) 12/09/2019 1427    CBG: No results for input(s): GLUCAP in the last 168 hours.  Recent Results (from the past 240 hours)  Surgical pcr screen     Status: None   Collection Time: 01/21/24  1:26 PM   Specimen: Nasal Mucosa; Nasal Swab  Result Value Ref Range Status   MRSA, PCR NEGATIVE NEGATIVE Final   Staphylococcus aureus NEGATIVE NEGATIVE Final    Comment: (NOTE) The Xpert SA Assay (FDA approved for NASAL specimens in patients 34 years of age and older), is one component of a comprehensive surveillance program. It is not intended to diagnose infection nor to guide or monitor treatment. Performed at [facility], [address], [place], [place] [address]   Aerobic/Anaerobic Culture w Gram Stain (surgical/deep wound)     Status: None (Preliminary result)   Collection Time: 01/21/24  5:03 PM   Specimen: Synovial, Right Hip; Body Fluid  Result Value Ref Range Status   Specimen Description   Final    SYNOVIAL Performed at [facility], [address] [place], [place] [address]    Special Requests SUPERFICIAL RIGHT HIP  Final   Gram Stain   Final    NO WBC SEEN NO ORGANISMS SEEN Performed at [facility], [address] [place], [place] [address]    Culture   Final    RARE PSEUDOMONAS AERUGINOSA NO ANAEROBES ISOLATED; CULTURE IN PROGRESS FOR 5 DAYS    Report Status PENDING  Incomplete   Organism ID, Bacteria  PSEUDOMONAS AERUGINOSA  Final      Susceptibility   Pseudomonas aeruginosa - MIC*    MEROPENEM 4 INTERMEDIATE Intermediate     CIPROFLOXACIN  0.5 SENSITIVE Sensitive     IMIPENEM 2 SENSITIVE Sensitive     CEFTAZIDIME/AVIBACTAM 2 SENSITIVE Sensitive ug/mL    CEFTOLOZANE/TAZOBACTAM 1 SENSITIVE Sensitive ug/mL  TOBRAMYCIN  <=1 SENSITIVE Sensitive     * RARE PSEUDOMONAS AERUGINOSA     Radiology Studies: No results found.    Nilda Fendt, MD, PhD [facility]  Between 7 am - 7 pm I am available, please contact me via Amion (for emergencies) or Securechat (non urgent messages)  Between 7 pm - 7 am I am not available, please contact night coverage MD/APP via Amion

## 2024-01-25 NOTE — Progress Notes (Signed)
 Physical Therapy Treatment Patient Details Name: Sherri [name]  Sherri [name] MRN: [id number] DOB: 02-07-1941 Today's Date: 01/25/2024   History of Present Illness Sherri  [name] is 83 y.o. female admitted 01/18/24 for right hip dislocation. Pt s/p R THA revision, head ball and liner exchange 8/28. PMHx: anxiety, GERD, HLD, HTN, MS, ACDF C7-T1. 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    PT Comments  Pt very lethargic/obtunded today - suspect medication related.   Limited session due to lethargy.  Hip abduction brace was in room but not on pt, so applied brace with increased time and educated family and caregiver on brace.  Also , performed some bed exercises within allowed ROM and with constant cues to keep pt awake.  Exercises focused on ROM and preventing contractures.  Hip abduction brace with much improved fit per family.  Cont POC.   Pt would benefit from SNF but family has caregivers and able to provide support - they prefer home with HHPT.     If plan is discharge home, recommend the following: Assistance with cooking/housework;Direct supervision/assist for medications management;Direct supervision/assist for financial management;Assist for transportation;Help with stairs or ramp for entrance;Two people to help with walking and/or transfers;Two people to help with bathing/dressing/bathroom   Can travel  by private vehicle        Equipment Recommendations  Hospital bed    Recommendations for Other Services       Precautions / Restrictions Precautions Precautions: Fall;Posterior Hip Precaution Booklet Issued: Yes (comment) Recall of Precautions/Restrictions: Impaired Precaution/Restrictions Comments: JP bulb, R groin wound vac Required Braces or Orthoses: Other Brace Other Brace: Hip Abduction Brace on at all times Restrictions RUE Partial Weight Bearing Percentage or Pounds: WBAT with R elbow extended, able to use RW per MD RLE Weight Bearing Per Provider Order: Weight bearing as tolerated     Mobility  Bed Mobility Overal bed mobility: Needs Assistance Bed Mobility: Rolling Rolling: Max assist, +2 for physical assistance, +2 for safety/equipment, Used rails         General bed mobility comments: Hip abduction brace on counter at arrival.  Family and aide report that [employer] came and changed the brace from previously documented poor fitting brace.  Pt was very lethargic and difficult to arouse - suspect medication related.  Session began by getting brace on pt as orders are for on at all times.  Pt was max x 2 to roll to L with R leg supported to maintain hip precautions.  Donned brace and rolled pt back to supine.  Adusted brace in supine.  Daughter reports looks much better as the previous one would go up to her chest.  Pt does tend to externally rotate hip.  Positioned in brace and poisitioned with blanket roll R hip and leg to promote more neutral rotation (still in slight ER).  Heels floating.  Noted brace locked at 60 degrees instead of 90 degrees.  Pt too lethargic to  attempt sitting today anyways , so not hindered by the brace ROM today.    Did contact ortho tech who reached out to [employer] and reports brace is set per guidelines and orders, would need order to change.  Notified them it is ok if that is what MD orders are for.    Transfers                   General  transfer comment: Deferred    Ambulation/Gait                   Stairs             Wheelchair Mobility     Tilt Bed    Modified Rankin (Stroke Patients Only)       Balance Overall balance assessment: History of Falls                                          Communication    Cognition Arousal: Obtunded Behavior During Therapy: Flat affect   PT - Cognitive impairments: Difficult to assess Difficult to assess due to: Level of arousal                     PT - Cognition Comments: Daughter reports pt generally recalls all hip precautions without difficulty; has handout in room Following commands: Impaired      Cueing Cueing Techniques: Verbal cues, Tactile cues, Visual cues  Exercises General Exercises - Lower Extremity Ankle Circles/Pumps: AAROM, Both, 10 reps, Supine (with heel cord stretch) Heel Slides: AAROM, Both, 10 reps, Supine Other Exercises Other Exercises: Pt very lethargic and required constant stimulation/cues for participation.  In addition to above worked on getting hip in more neutral rotation (was ER) with slow stretch and getting knee straight with slow stretch    General Comments General comments (skin integrity, edema, etc.): Daughter and caregiver present.  They report plan is to return home with 24 hr assist.  Educated on brace application and to be wore at all times.  Daughter with questions regarding if therapy would continue if pt have further surgery with antibiotic spacer placed.  Discussed we do generally continue to work with pts as long as cleared by ortho.  Discussed generally pt's are still able to transfer and move after antibiotic spacer but may have weight bearing restrictiions - pending ortho orders.      Pertinent Vitals/Pain Pain Assessment Pain Assessment: Faces Faces Pain Scale: Hurts even more Pain Location: R hip with movement Pain Descriptors / Indicators: Discomfort, Grimacing, Guarding,  Moaning Pain Intervention(s): Limited activity within patient's tolerance, Monitored during session, Premedicated before session, Repositioned (Pt lethargic and falling asleep excpet when moved)    Home Living                          Prior Function            PT Goals (current goals can now be found in the care plan section) Progress towards PT goals: Not progressing toward goals - comment (lethargic)    Frequency    Min 2X/week      PT Plan      Co-evaluation              AM-PAC PT 6 Clicks Mobility   Outcome Measure  Help needed turning from  your back to your side while in a flat bed without using bedrails?: Total Help needed moving from lying on your back to sitting on the side of a flat bed without using bedrails?: Total Help needed moving to and from a bed to a chair (including a wheelchair)?: Total Help needed standing up from a chair using your arms (e.g., wheelchair or bedside chair)?: Total Help needed to walk in hospital room?: Total Help needed climbing 3-5 steps with a railing? : Total 6 Click Score: 6    End of Session Equipment Utilized During Treatment: Other (comment) (hip abd brace placed) Activity Tolerance: Patient limited by lethargy Patient left: in bed;with call bell/phone within reach;with bed alarm set;with family/visitor present Nurse Communication: Mobility status PT Visit Diagnosis: Unsteadiness on feet (R26.81);Other abnormalities of gait and mobility (R26.89);Muscle weakness (generalized) (M62.81);History of falling (Z91.81);Difficulty in walking, not elsewhere classified (R26.2);Pain Pain - Right/Left: Right Pain - part of body: Hip     Time: 1230-1259 PT Time Calculation (min) (ACUTE ONLY): 29 min  Charges:    $Therapeutic Exercise: 8-22 mins $Therapeutic Activity: 8-22 mins PT General Charges $$ ACUTE PT VISIT: 1 Visit                     Benjiman, PT Acute Rehab [facility]  [phone]    Benjiman VEAR [place] 01/25/2024, 1:46 PM

## 2024-01-26 ENCOUNTER — Other Ambulatory Visit: Payer: Self-pay

## 2024-01-26 DIAGNOSIS — T8459XD Infection and inflammatory reaction due to other internal joint prosthesis, subsequent encounter: Secondary | ICD-10-CM | POA: Diagnosis not present

## 2024-01-26 DIAGNOSIS — Z96649 Presence of unspecified artificial hip joint: Secondary | ICD-10-CM | POA: Diagnosis not present

## 2024-01-26 DIAGNOSIS — T8459XA Infection and inflammatory reaction due to other internal joint prosthesis, initial encounter: Secondary | ICD-10-CM | POA: Diagnosis not present

## 2024-01-26 DIAGNOSIS — A498 Other bacterial infections of unspecified site: Secondary | ICD-10-CM | POA: Diagnosis not present

## 2024-01-26 LAB — CBC
HCT: 25.4 % — ABNORMAL LOW (ref 36.0–46.0)
Hemoglobin: 8.1 g/dL — ABNORMAL LOW (ref 12.0–15.0)
MCH: 29.2 pg (ref 26.0–34.0)
MCHC: 31.9 g/dL (ref 30.0–36.0)
MCV: 91.7 fL (ref 80.0–100.0)
Platelets: 265 K/uL (ref 150–400)
RBC: 2.77 MIL/uL — ABNORMAL LOW (ref 3.87–5.11)
RDW: 18.5 % — ABNORMAL HIGH (ref 11.5–15.5)
WBC: 8 K/uL (ref 4.0–10.5)
nRBC: 0 % (ref 0.0–0.2)

## 2024-01-26 LAB — COMPREHENSIVE METABOLIC PANEL WITH GFR
ALT: 7 U/L (ref 0–44)
AST: 17 U/L (ref 15–41)
Albumin: 1.7 g/dL — ABNORMAL LOW (ref 3.5–5.0)
Alkaline Phosphatase: 57 U/L (ref 38–126)
Anion gap: 9 (ref 5–15)
BUN: <5 mg/dL — ABNORMAL LOW (ref 8–23)
CO2: 29 mmol/L (ref 22–32)
Calcium: 8 mg/dL — ABNORMAL LOW (ref 8.9–10.3)
Chloride: 94 mmol/L — ABNORMAL LOW (ref 98–111)
Creatinine, Ser: 0.47 mg/dL (ref 0.44–1.00)
GFR, Estimated: >60 mL/min (ref >60)
Glucose, Bld: 78 mg/dL (ref 70–99)
Potassium: 3.7 mmol/L (ref 3.5–5.1)
Sodium: 132 mmol/L — ABNORMAL LOW (ref 135–145)
Total Bilirubin: 0.4 mg/dL (ref 0.0–1.2)
Total Protein: <3 g/dL — ABNORMAL LOW (ref 6.5–8.1)

## 2024-01-26 LAB — AEROBIC/ANAEROBIC CULTURE W GRAM STAIN (SURGICAL/DEEP WOUND): Gram Stain: NONE SEEN

## 2024-01-26 LAB — MAGNESIUM: Magnesium: 1.8 mg/dL (ref 1.7–2.4)

## 2024-01-26 MED ORDER — OXYCODONE HCL 5 MG PO TABS: 5.0000 mg | ORAL_TABLET | ORAL | 0 refills | Status: DC | PRN

## 2024-01-26 NOTE — Discharge Summary (Signed)
 Physician Discharge Summary  Sherri  MICHON [name]:[id number] DOB: 11-23-1940 DOA: 01/18/2024  PCP: [name] PARAS, MD  Admit date: 01/18/2024 Discharge date: 01/26/2024  Admitted From: home Disposition:  home  Recommendations for Outpatient Follow-up:  Follow up with PCP in 1-2 weeks Follow up with orthopedic surgery Follow-up with ID as scheduled  Home Health: PT, RN Equipment/Devices: PICC line  Discharge Condition: Stable CODE STATUS: Full code Diet Orders (From admission, onward)     Start     Ordered   01/23/24 1040  DIET DYS 3 Room service appropriate? Yes with Assist; Fluid consistency: Nectar Thick  Diet effective now       Comments: Free Water  Protocol  Question Answer Comment  Room service appropriate? Yes with Assist   Fluid consistency: Nectar Thick      01/23/24 1040            Brief Narrative / Interim history: 83 year old female with RA, MS, chronic pain etiology closely right hip pain.  Was found to have persistent hip dislocation, orthopedic surgery was consulted and she was admitted to the hospital  Hospital Course / Discharge diagnoses: Principal Problem:   Prosthetic hip infection (HCC) Active Problems:   Hip dislocation, right (HCC)   Hyponatremia   Anemia of chronic disease   Rheumatoid arthritis (HCC)   Hyperlipidemia   Chronic pain   Multiple sclerosis (HCC)   GERD (gastroesophageal reflux disease)   Pseudomonas aeruginosa infection   Principal problem Right hip dislocation-orthopedic surgery consulted, she was taken to the OR on 8/28 by Dr. Fidel and is status post right total hip revision, head ball and liner exchange. PT recommended SNF, however in discussing with her husband he wants her home and has enough around-the-clock care.  She is good to go home with a portable wound VAC that will be removed in Ortho office, and a drain   Active problems Right hip wound with Pseudomonas -intraoperative cultures from wound overlying the  hip are growing Pseudomonas, ID consulted, we do have to assume that prosthetic hip is infected with Pseudomonas.  She was placed on cefepime  for a total of 6 weeks, and likely she needs to have suppressive ciprofloxacin  following that moving forward  Chronic hyponatremia -continue salt tabs, sodium varying but overall stable  Chronic anemia, acute blood loss anemia-hemoglobin dipped to 7.1 on 8/29, postoperatively, she was given a unit of packed red blood cells and improved appropriately, no hemoglobin overall stable, no bleeding Dysphagia-chronic problem, continue dysphagia diet  RA-continue methotrexate  Hypokalemia-potassium normalized  Hypomagnesemia-replenish normalized Hyperlipidemia-continue statin Chronic pain-continue Cymbalta  History of MS -not on active treatment  Sepsis ruled out   Discharge Instructions  Discharge Instructions     [facility] pharmacist to adjust dose for Vancomycin , Aminoglycosides and other anti-infective therapies as requested by physician.   Complete by: As directed    [facility] to provide Cath Flo 2mg    Complete by: As directed    Administer for PICC line occlusion and as ordered by physician for other access device issues.   Anaphylaxis Kit: Provided to treat any anaphylactic reaction to the medication being provided to the patient if First Dose or when requested by physician   Complete by: As directed    Epinephrine  1mg /ml vial / amp: Administer 0.3mg  (0.70ml) subcutaneously once for moderate to severe anaphylaxis, nurse to call physician and pharmacy when reaction occurs and call 911 if needed for immediate care   Diphenhydramine  50mg /ml IV vial: Administer 25-50mg  IV/IM PRN for  first dose reaction, rash, itching, mild reaction, nurse to call physician and pharmacy when reaction occurs   Sodium Chloride  0.9% NS 500ml IV: Administer if needed for hypovolemic blood pressure drop or as ordered by physician after call to physician  with anaphylactic reaction   Change dressing on IV access line weekly and PRN   Complete by: As directed    Flush IV access with Sodium Chloride  0.9% and Heparin  10 units/ml or 100 units/ml   Complete by: As directed    Home infusion instructions - [facility]   Complete by: As directed    Instructions: Flush IV access with Sodium Chloride  0.9% and Heparin  10units/ml or 100units/ml   Change dressing on IV access line: Weekly and PRN   Instructions Cath Flo 2mg : Administer for PICC Line occlusion and as ordered by physician for other access device   [facility] pharmacist to adjust dose for: Vancomycin , Aminoglycosides and other anti-infective therapies as requested by physician   Method of administration may be changed at the discretion of home infusion pharmacist based upon assessment of the patient and/or caregiver's ability to self-administer the medication ordered   Complete by: As directed       Allergies as of 01/26/2024       Reactions   Darvon [propoxyphene Hcl] Shortness Of Breath   ? Dose Related ? Lowered respirations greatly   Demerol [meperidine] Shortness Of Breath, Other (See Comments)   Respiratory Distress   Penicillins Hives, Other (See Comments)   Has patient had a PCN reaction causing immediate rash, facial/tongue/throat swelling, SOB or lightheadedness with hypotension: No SEVERE RASH INVOLVING MUCUS MEMBRANES or SKIN NECROSIS: #  #  #  YES  #  #  #  Has patient had a PCN reaction that required hospitalization No Has patient had a PCN reaction occurring within the last 10 years: No.   Imuran [azathioprine] Other (See Comments)   Weakness   Sulfa  Antibiotics Nausea Only, Other (See Comments)   States had severe indigestion and heartburn and nausea and had to stop taking   Arava [leflunomide] Other (See Comments)   Excessive weight gain   Lipitor [atorvastatin ] Nausea And Vomiting   Currently taking 2 times weekly - able to tolerate low dose     Vytorin [ezetimibe-simvastatin] Nausea And Vomiting        Medication List     PAUSE taking these medications    [name]  15 MG Tb24 Wait to take this until your doctor or other care provider tells you to start again. Resume when cleared by your doctors Generic drug: Upadacitinib  ER Take 15 mg by mouth daily.       STOP taking these medications    cefadroxil  500 MG capsule Commonly known as: DURICEF   traMADol  50 MG tablet Commonly known as: ULTRAM        TAKE these medications    acetaminophen  325 MG tablet Commonly known as: TYLENOL  Take 2 tablets (650 mg total) by mouth every 6 (six) hours as needed for mild pain (pain score 1-3) or fever (or Fever >/= 101).   atorvastatin  10 MG tablet Commonly known as: LIPITOR Take 1 tablet (10 mg total) by mouth every evening.   Biotin  5000 MCG Tabs Take 10,000 mcg by mouth in the morning.   bisacodyl  10 MG suppository Commonly known as: DULCOLAX Place 1 suppository (10 mg total) rectally daily as needed for moderate constipation.   ceFEPime  IVPB Commonly known as: MAXIPIME  Inject 2 g into the  vein every 12 (twelve) hours. Indication:  right hip PJI First Dose: No Last Day of Therapy:  03/07/24 Labs - Once weekly:  CBC/D and BMP, Labs - Once weekly: ESR and CRP Method of administration: IV Push Method of administration may be changed at the discretion of home infusion pharmacist based upon assessment of the patient and/or caregiver's ability to self-administer the medication ordered.   cholecalciferol  25 MCG (1000 UNIT) tablet Commonly known as: VITAMIN D3 Take 1,000 Units by mouth in the morning.   cyclobenzaprine  10 MG tablet Commonly known as: FLEXERIL  TAKE 1 TABLET BY MOUTH THREE TIMES A DAY AS NEEDED FOR MUSCLE SPASM What changed: See the new instructions.   diazepam  5 MG tablet Commonly known as: VALIUM  TAKE 1 TABLET BY MOUTH AT BEDTIME AS NEEDED FOR MUSCLE SPASMS. What changed: See the new  instructions.   docusate sodium  100 MG capsule Commonly known as: COLACE Take 1 capsule (100 mg total) by mouth daily.   DULoxetine  20 MG capsule Commonly known as: CYMBALTA  Take 1 capsule (20 mg total) by mouth at bedtime.   folic acid  1 MG tablet Commonly known as: FOLVITE  Take 1 tablet (1 mg total) by mouth daily.   furosemide  20 MG tablet Commonly known as: LASIX  Take 0.5 tablets (10 mg total) by mouth daily.   Gerhardt's butt cream Crea Apply 1 Application topically 3 (three) times daily.   LUBRICATING EYE DROPS OP Place 1 drop into both eyes daily as needed (dry eyes).   magic mouthwash (nystatin , lidocaine , diphenhydrAMINE , alum & mag hydroxide) suspension Swish and spit 5 mLs 3 (three) times daily as needed for mouth pain.   methotrexate  2.5 MG tablet Commonly known as: RHEUMATREX Take 5 tablets (12.5 mg total) by mouth once a week. Caution:Chemotherapy. Protect from light.   oxyCODONE  5 MG immediate release tablet Commonly known as: Roxicodone  Take 1 tablet (5 mg total) by mouth every 4 (four) hours as needed for severe pain (pain score 7-10).   pantoprazole  40 MG tablet Commonly known as: PROTONIX  Take 1 tablet (40 mg total) by mouth 2 (two) times daily before a meal.   polyethylene glycol powder 17 GM/SCOOP powder Commonly known as: GLYCOLAX /MIRALAX  Take 17 g by mouth 2 (two) times daily. What changed: when to take this   potassium chloride  SA 20 MEQ tablet Commonly known as: KLOR-CON  M Take 0.5 tablets (10 mEq total) by mouth every other day.   predniSONE  5 MG tablet Commonly known as: DELTASONE  TAKE 1 TABLET BY MOUTH EVERY DAY IN THE MORNING   Prolia  60 MG/ML Sosy injection Generic drug: denosumab  Inject 60 mg into the skin every 6 (six) months. Deliver to rheum: [address], [address], [place], [place] [address]. Appt on 07/23/2022   senna 8.6 MG tablet Commonly known as: SENOKOT Take 1 tablet by mouth at bedtime.   sodium chloride  1 g  tablet Take 1 tablet (1 g total) by mouth 2 (two) times daily with a meal.   tamsulosin  0.4 MG Caps capsule Commonly known as: FLOMAX  Take 1 capsule (0.4 mg total) by mouth daily after supper.   vitamin C  1000 MG tablet Take 1 tablet (1,000 mg total) by mouth daily.   Zinc  Sulfate 220 (50 Zn) MG Tabs Take 1 tablet (220 mg total) by mouth daily with supper.               Discharge Care Instructions  (From admission, onward)           Start  Ordered   01/25/24 0000  Change dressing on IV access line weekly and PRN  (Home infusion instructions - [facility] )        01/25/24 1451            Follow-up Information     Leigh Valery RAMAN, PA-C. Schedule an appointment as soon as possible for a visit in 7 day(s).   Specialty: Orthopedic Surgery Why: For removal of negative pressure incisional dressing and for wound re-check Contact information: [address], [address] [place] [address] [phone]         [name] PARAS, MD Follow up.   Specialty: Internal Medicine Contact information: [address] [place] [address] [phone]         [facility] Follow up.   Why: Home health services will be provied by [facility]        [facility] Follow up.   Why: IV antibiotic therapy will be provided by [facility]. Questions please call [phone]                Consultations: Orthopedic surgery ID  Procedures/Studies:  US  EKG SITE RITE Result Date: 01/25/2024 If Site Rite image not attached, placement could not be confirmed due to current cardiac rhythm.  DG HIP UNILAT W OR W/O PELVIS 2-3 VIEWS RIGHT Result Date: 01/21/2024 CLINICAL DATA:  Status post right hip revision EXAM: DG HIP (WITH OR WITHOUT PELVIS) 2-3V RIGHT COMPARISON:  01/19/2019 FINDINGS: New femoral component is identified. It is well seated within the acetabular component. Stable left hip replacement and lumbar spine  surgery is noted. IMPRESSION: Right hip revision Electronically Signed   By: Oneil Devonshire M.D.   On: 01/21/2024 19:53   DG HIP UNILAT WITH PELVIS 1V RIGHT Result Date: 01/21/2024 CLINICAL DATA:  Elective surgery. EXAM: DG HIP (WITH OR WITHOUT PELVIS) 1V RIGHT COMPARISON:  None Available. FINDINGS: Intraoperative spot views include bilateral total hip replacements in place. No acute osseous finding. Fluoro Time: 3 sec; Fluoro Dose: 0.2605 mGy IMPRESSION: Intraoperative spot views. Electronically Signed   By: Megan  Zare M.D.   On: 01/21/2024 18:09   DG C-Arm 1-60 Min-No Report Result Date: 01/21/2024 Fluoroscopy was utilized by the requesting physician.  No radiographic interpretation.   DG C-Arm 1-60 Min-No Report Result Date: 01/21/2024 Fluoroscopy was utilized by the requesting physician.  No radiographic interpretation.   DG HIP UNILAT WITH PELVIS 1V RIGHT Result Date: 01/19/2024 CLINICAL DATA:  Right hip pain EXAM: DG HIP (WITH OR WITHOUT PELVIS) 1V RIGHT COMPARISON:  Film from the previous day. FINDINGS: Right hip prosthesis remains superiorly dislocated. No other focal abnormality is noted IMPRESSION: Persistent right hip dislocation. Electronically Signed   By: Oneil Devonshire M.D.   On: 01/19/2024 03:36   DG Hip Unilat W or Wo Pelvis 2-3 Views Right Result Date: 01/18/2024 CLINICAL DATA:  Recent right hip repair with possible dislocation EXAM: DG HIP (WITH OR WITHOUT PELVIS) 2-3V RIGHT COMPARISON:  12/16/2023 FINDINGS: Right femoral prosthesis is dislocated superiorly with respect to the acetabulum. Left hip replacement and postsurgical changes in the lumbosacral region are seen. No acute fracture is noted. No other focal abnormality is noted. IMPRESSION: Right hip dislocation Electronically Signed   By: Oneil Devonshire M.D.   On: 01/18/2024 23:37     Subjective: - no chest pain, shortness of breath, no abdominal pain, nausea or vomiting.   Discharge Exam: BP 129/62 (BP Location: Left Arm)    Pulse  93   Temp (!) 97.3 F (36.3 C) (Oral)   Resp 16   Ht 5' 1 (1.549 m)   Wt 47 kg   SpO2 98%   BMI 19.58 kg/m   General: Pt is alert, awake, not in acute distress Cardiovascular: RRR, S1/S2 +, no rubs, no gallops Respiratory: CTA bilaterally, no wheezing, no rhonchi Abdominal: Soft, NT, ND, bowel sounds + Extremities: no edema, no cyanosis    The results of significant diagnostics from this hospitalization (including imaging, microbiology, ancillary and laboratory) are listed below for reference.     Microbiology: Recent Results (from the past 240 hours)  Surgical pcr screen     Status: None   Collection Time: 01/21/24  1:26 PM   Specimen: Nasal Mucosa; Nasal Swab  Result Value Ref Range Status   MRSA, PCR NEGATIVE NEGATIVE Final   Staphylococcus aureus NEGATIVE NEGATIVE Final    Comment: (NOTE) The Xpert SA Assay (FDA approved for NASAL specimens in patients 41 years of age and older), is one component of a comprehensive surveillance program. It is not intended to diagnose infection nor to guide or monitor treatment. Performed at [facility], [address], [place], [place] [address]   Aerobic/Anaerobic Culture w Gram Stain (surgical/deep wound)     Status: None (Preliminary result)   Collection Time: 01/21/24  5:03 PM   Specimen: Synovial, Right Hip; Body Fluid  Result Value Ref Range Status   Specimen Description   Final    SYNOVIAL Performed at [facility], [address], [place], [place] [address]    Special Requests SUPERFICIAL RIGHT HIP  Final   Gram Stain   Final    NO WBC SEEN NO ORGANISMS SEEN Performed at [facility], [address] [place], [place] [address]    Culture   Final    RARE PSEUDOMONAS AERUGINOSA NO ANAEROBES ISOLATED; CULTURE IN PROGRESS FOR 5 DAYS    Report Status PENDING  Incomplete   Organism ID, Bacteria PSEUDOMONAS AERUGINOSA  Final      Susceptibility   Pseudomonas  aeruginosa - MIC*    MEROPENEM 4 INTERMEDIATE Intermediate     CIPROFLOXACIN  0.5 SENSITIVE Sensitive     IMIPENEM 2 SENSITIVE Sensitive     CEFTAZIDIME/AVIBACTAM 2 SENSITIVE Sensitive ug/mL    CEFTOLOZANE/TAZOBACTAM 1 SENSITIVE Sensitive ug/mL    TOBRAMYCIN  <=1 SENSITIVE Sensitive     * RARE PSEUDOMONAS AERUGINOSA     Labs: Basic Metabolic Panel: Recent Labs  Lab 01/22/24 0903 01/23/24 0828 01/24/24 0241 01/25/24 0557 01/26/24 0219  NA 131* 132* 132* 135 132*  K 3.8 3.6 3.4* 3.7 3.7  CL 93* 97* 98 95* 94*  CO2 26 25 26 30 29   GLUCOSE 96 82 88 75 78  BUN 7* 8 7* 6* <5*  CREATININE 0.52 0.48 0.38* 0.51 0.47  CALCIUM  7.9* 8.3* 8.4* 8.7* 8.0*  MG 1.5* 1.8 1.5* 1.4* 1.8   Liver Function Tests: Recent Labs  Lab 01/22/24 0903 01/23/24 0828 01/24/24 0241 01/26/24 0219  AST 23 21 19 17   ALT 8 7 <5 7  ALKPHOS 47 54 52 57  BILITOT 0.6 0.8 0.5 0.4  PROT 4.1* 4.7* 4.2* <3.0*  ALBUMIN  1.9* 1.9* 1.7* 1.7*   CBC: Recent Labs  Lab 01/22/24 0903 01/23/24 0828 01/24/24 0241 01/25/24 0557 01/26/24 0219  WBC 10.3 12.3* 8.3 9.4 8.0  HGB 7.1* 9.2* 8.7* 9.2* 8.1*  HCT 22.3* 29.2* 27.2* 29.1* 25.4*  MCV 96.1 92.1 91.0 92.7 91.7  PLT 307 280 290 298 265   CBG: No results for input(s): GLUCAP in the last 168 hours. Hgb A1c No results for input(s): HGBA1C in the last 72 hours. Lipid Profile No results for input(s): CHOL, HDL, LDLCALC, TRIG, CHOLHDL, LDLDIRECT in the last 72 hours. Thyroid  function studies No results for input(s): TSH, T4TOTAL, T3FREE, THYROIDAB in the last 72 hours.  Invalid input(s): FREET3 Urinalysis    Component Value Date/Time   COLORURINE YELLOW 12/14/2023 1626   APPEARANCEUR CLEAR 12/14/2023 1626   LABSPEC 1.010 12/14/2023 1626   PHURINE 7.0 12/14/2023 1626   GLUCOSEU NEGATIVE 12/14/2023 1626   GLUCOSEU NEGATIVE 04/10/2020 0914   HGBUR NEGATIVE 12/14/2023 1626   HGBUR trace-lysed 05/29/2010 0750   BILIRUBINUR NEGATIVE  12/14/2023 1626   KETONESUR NEGATIVE 12/14/2023 1626   PROTEINUR NEGATIVE 12/14/2023 1626   UROBILINOGEN 0.2 04/10/2020 0914   NITRITE NEGATIVE 12/14/2023 1626   LEUKOCYTESUR MODERATE (A) 12/14/2023 1626    FURTHER DISCHARGE INSTRUCTIONS:   Get Medicines reviewed and adjusted: Please take all your medications with you for your next visit with your Primary MD   Laboratory/radiological data: Please request your Primary MD to go over all hospital tests and procedure/radiological results at the follow up, please ask your Primary MD to get all Hospital records sent to his/her office.   In some cases, they will be blood work, cultures and biopsy results pending at the time of your discharge. Please request that your primary care M.D. goes through all the records of your hospital data and follows up on these results.   Also Note the following: If you experience worsening of your admission symptoms, develop shortness of breath, life threatening emergency, suicidal or homicidal thoughts you must seek medical attention immediately by calling 911 or calling your MD immediately  if symptoms less severe.   You must read complete instructions/literature along with all the possible adverse reactions/side effects for all the Medicines you take and that have been prescribed to you. Take any new Medicines after you have completely understood and accpet all the possible adverse reactions/side effects.    Do not drive when taking Pain medications or sleeping medications (Benzodaizepines)   Do not take more than prescribed Pain, Sleep and Anxiety Medications. It is not advisable to combine anxiety,sleep and pain medications without talking with your primary care practitioner   Special Instructions: If you have smoked or chewed Tobacco  in the last 2 yrs please stop smoking, stop any regular Alcohol   and or any Recreational drug use.   Wear Seat belts while driving.   Please note: You were cared for by a  hospitalist during your hospital stay. Once you are discharged, your primary care physician will handle any further medical issues. Please note that NO REFILLS for any discharge medications will be authorized once you are discharged, as it is imperative that you return to your primary care physician (or establish a relationship with a primary care physician if you do not have one) for your post hospital discharge needs so that they can reassess your need for medications and monitor your lab values.  Time coordinating discharge: 35 minutes  SIGNED:  Nilda Fendt, MD, PhD 01/26/2024, 10:19 AM

## 2024-01-26 NOTE — Progress Notes (Signed)
 Physical Therapy Treatment Patient Details Name: Sherri [name]  Sherri [name] MRN: [id number] DOB: [birth year] Today's Date: 01/26/2024   History of Present Illness [place]  A Wahba is 83 y.o. female admitted 01/18/24 for right hip dislocation. Pt s/p R THA revision, head ball and liner exchange 8/28. PMHx: anxiety, GERD, HLD, HTN, MS, ACDF C7-T1. 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    PT Comments  Pt resting in bed on arrival, awake and alert and agreeable to session with steady progress towards acute goals. Pt with abduction brace donned on arrival. Pt able to come to sitting EOB with mod A with increased cues for sequencing and initiation. Pt able to maintain sitting EOB with single UE support without assist for completion of one ADL. Pt standing from EOB to RW with max A with strong L lateral lean due to R hip pain in standing. Pt care-person present and supportive throughout session. Educated pt on appropriate activity progression and importance of time up OOB with pt verbalizing understanding. Pt continues to benefit from skilled PT services to progress toward functional mobility goals.     If plan is discharge home, recommend the following: Assistance with cooking/housework;Direct supervision/assist for medications management;Direct supervision/assist for financial management;Assist for  transportation;Help with stairs or ramp for entrance;Two people to help with walking and/or transfers;Two people to help with bathing/dressing/bathroom   Can travel by private vehicle        Equipment Recommendations  Hospital bed    Recommendations for Other Services       Precautions / Restrictions Precautions Precautions: Fall;Posterior Hip Precaution Booklet Issued: Yes (comment) Recall of Precautions/Restrictions: Impaired Precaution/Restrictions Comments: JP bulb, R groin wound vac Required Braces or Orthoses: Other Brace Other Brace: Hip Abduction Brace on at all times Restrictions Weight Bearing Restrictions Per Provider Order: Yes RUE Weight Bearing Per Provider Order: Partial weight bearing RUE Partial Weight Bearing Percentage or Pounds: WBAT with R elbow ext, able to use RW per MD RLE Weight Bearing Per Provider Order: Weight bearing as tolerated     Mobility  Bed Mobility Overal bed mobility: Needs Assistance Bed Mobility: Rolling, Sidelying to Sit, Sit to Supine Rolling: Used rails, Min assist Sidelying to sit: Mod assist, HOB elevated, Used rails   Sit to supine: Max assist   General bed mobility comments: step by step cues to sequence to EOB and for maintaining precautions, pt able to assist with UEs on rail and LLE. mod A to elevate trunk to sitting, max A to return to supine due to fatigue    Transfers Overall transfer level: Needs assistance Equipment used: Rolling walker (2 wheels) Transfers: Sit to/from Stand Sit to Stand: Max assist, From elevated surface           General transfer comment: pt slightly elevated, max A to boost with pt leaning L on rise and unable to correct to midline to maintain balance    Ambulation/Gait  Stairs             Wheelchair Mobility     Tilt Bed    Modified Rankin (Stroke Patients Only)       Balance Overall balance assessment: History of Falls                                           Communication Communication Communication: Impaired Factors Affecting Communication: Hearing impaired  Cognition Arousal: Alert Behavior During Therapy: Flat affect   PT - Cognitive impairments: Sequencing, Initiation                       PT - Cognition Comments: pt careperson Lo present and supportive Following commands: Impaired Following commands impaired: Follows multi-step commands with increased time    Cueing Cueing Techniques: Verbal cues, Tactile cues, Visual cues  Exercises      General Comments        Pertinent Vitals/Pain Pain Assessment Pain Assessment: Faces Faces Pain Scale: Hurts a little bit Pain Location: R hip in standing Pain Descriptors / Indicators: Discomfort, Grimacing, Guarding, Moaning    Home Living                          Prior Function            PT Goals (current goals can now be found in the care plan section) Acute Rehab PT Goals Patient Stated Goal: to go back home PT Goal Formulation: With patient/family Time For Goal Achievement: 02/06/24 Progress towards PT goals: Progressing toward goals    Frequency    Min 2X/week      PT Plan      Co-evaluation              AM-PAC PT 6 Clicks Mobility   Outcome Measure  Help needed turning from your back to your side while in a flat bed without using bedrails?: Total Help needed moving from lying on your back to sitting on the side of a flat bed without using bedrails?: Total Help needed moving to and from a bed to a chair (including a wheelchair)?: Total Help needed standing up from a chair using your arms (e.g., wheelchair or bedside chair)?: Total Help needed to walk in hospital room?: Total Help needed climbing 3-5 steps with a railing? : Total 6 Click Score: 6    End of Session Equipment Utilized During Treatment: Other (comment) (hip abd brace) Activity Tolerance: Patient tolerated treatment well Patient  left: in bed;with call bell/phone within reach;with bed alarm set;with family/visitor present;with nursing/sitter in room Nurse Communication: Mobility status PT Visit Diagnosis: Unsteadiness on feet (R26.81);Other abnormalities of gait and mobility (R26.89);Muscle weakness (generalized) (M62.81);History of falling (Z91.81);Difficulty in walking, not elsewhere classified (R26.2);Pain Pain - Right/Left: Right Pain - part of body: Hip     Time: 8970-8944 PT Time Calculation (min) (ACUTE ONLY): 26 min  Charges:    $Therapeutic Activity: 23-37 mins PT General Charges $$ ACUTE PT VISIT: 1 Visit                     Denelle Capurro R. PTA Acute Rehabilitation Services Office: [phone]   Therisa CHRISTELLA Boor 01/26/2024, 1:09 PM

## 2024-01-26 NOTE — TOC Transition Note (Signed)
 Transition of Care [facility]) - Discharge Note   Patient Details  Name: Sherri  LAUREY [name] MRN: [id number] Date of Birth: [birth year]  Transition of [facility]) CM/SW Contact:  Sherri Jon Bloch, RN Phone Number: 01/26/2024, 4:19 PM   Clinical Narrative:    Patient will DC to: home Anticipated DC date: 01/26/2024 Family notified: yes Transport by: [facility]  Per MD patient ready for DC today. RN, patient, patient's daughter, [facility] and Dentist notified of DC.  DC packet on chart. PTAR/Ambulance transport requested for patient.   RNCM will sign off for now as intervention is no longer needed. Please consult us  again if new needs arise.   Final next level of care: Home w Home Health Services Barriers to Discharge: Other (must enter comment) (home infusion teaching)   Patient Goals and CMS Choice     Choice offered to / list presented to : Adult Children, Patient      Discharge Placement                       Discharge Plan and Services Additional resources added to the After Visit Summary for     Discharge Planning Services: CM Consult            DME Arranged: Vannie rolling, Walker platform DME Agency: [facility] Date DME Agency Contacted: 01/25/24 Time DME Agency Contacted: [phone] Representative spoke with at DME Agency: [facility] HH Arranged: RN, PT, OT [facility] Agency: [facility] Date HH Agency Contacted: 01/25/24 Time HH Agency Contacted: [phone] Representative spoke with at [facility] Agency: [facility]  Social Drivers of Health (SDOH) Interventions SDOH Screenings   Food Insecurity: No Food Insecurity (01/19/2024)  Housing: Unknown (01/19/2024)  Recent Concern: Housing - High Risk (11/27/2023)  Transportation Needs: Patient Declined (01/19/2024)  Utilities: Patient Declined (01/19/2024)  Alcohol  Screen: Low Risk  (02/21/2022)  Depression (PHQ2-9): Low Risk  (07/27/2023)  Financial Resource Strain: Low Risk  (02/21/2022)  Physical Activity: Inactive  (02/21/2022)  Social Connections: Socially Integrated (01/19/2024)  Stress: Stress Concern Present (02/21/2022)  Tobacco Use: Medium Risk (01/21/2024)     Readmission Risk Interventions     No data to display

## 2024-01-26 NOTE — TOC Progression Note (Signed)
 Transition of Care [facility]) - Progression Note    Patient Details  Name: Sherri [name] MRN: [id number] Date of Birth: [birth year]  Transition of Care [facility]) CM/SW Contact  Rosalva Jon Bloch, RN Phone Number: 01/26/2024, 9:57 AM  Clinical Narrative:     Per MD ready for d/c today. Awaiting IV infusion teaching. Pam with [facility] will be @ bedside to complete teaching this afternoon.  Pt will need non emergent ambulance transportation to home arranged once d/c ready.  IPCM teaming monitoring...  Expected Discharge Plan: Home w Home Health Services Barriers to Discharge: Other (must enter comment) (home infusion teaching)               Expected Discharge Plan and Services   Discharge Planning Services: CM Consult   Living arrangements for the past 2 months: Single Family Home                 DME Arranged: Walker rolling, Scientific laboratory technician DME Agency: [facility] Date DME Agency Contacted: 01/25/24 Time DME Agency Contacted: [phone] Representative spoke with at DME Agency: [facility] HH Arranged: RN, PT, OT [facility] Agency: [facility] Date HH Agency Contacted: 01/25/24 Time HH Agency Contacted: [phone] Representative spoke with at [facility] Agency: [facility]   Social Drivers of Health (SDOH) Interventions SDOH Screenings   Food Insecurity: No Food Insecurity (01/19/2024)  Housing: Unknown (01/19/2024)  Recent Concern: Housing - High Risk (11/27/2023)  Transportation Needs: Patient Declined (01/19/2024)  Utilities: Patient Declined (01/19/2024)  Alcohol  Screen: Low Risk  (02/21/2022)  Depression (PHQ2-9): Low Risk  (07/27/2023)  Financial Resource Strain: Low Risk  (02/21/2022)  Physical Activity: Inactive (02/21/2022)  Social Connections: Socially Integrated (01/19/2024)  Stress: Stress Concern Present (02/21/2022)  Tobacco Use: Medium Risk (01/21/2024)    Readmission Risk Interventions     No data to display

## 2024-01-26 NOTE — Discharge Planning (Signed)
 Patient alert. Patient going home with PICC line. Discharge teaching given to patient's daughter Suzen Bruns including medications. Discharge instructions placed in discharge packet along with written prescription by discharge provider. Patient will be transported home via [facility].

## 2024-01-26 NOTE — Progress Notes (Signed)
 AVS completed for discharge packet Ready to print after Iv infusion teaching.

## 2024-01-27 ENCOUNTER — Telehealth: Payer: Self-pay | Admitting: *Deleted

## 2024-01-27 NOTE — Transitions of Care (Post Inpatient/ED Visit) (Signed)
 01/27/2024  Name: Sherri  HAROLDINE [name] MRN: [id number] DOB: [birth year]  Today's TOC FU Call Status: Today's TOC FU Call Status:: Successful TOC FU Call Completed TOC FU Call Complete Date: 01/27/24 Patient's Name and Date of Birth confirmed.  Transition Care Management Follow-up Telephone Call Date of Discharge: 01/26/24 Discharge Facility: [facility]) Type of Discharge: Inpatient Admission Primary Inpatient Discharge Diagnosis:: Jomarie [place] infection disease  Monday Feb 15, 2024 1:15 PM How have you been since you were released from the hospital?: Better Any questions or concerns?: No  Items Reviewed: Did you receive and understand the discharge instructions provided?: Yes Medications obtained,verified, and reconciled?: Yes (Medications Reviewed) Any new allergies since your discharge?: No Dietary orders reviewed?: No Do you have support at home?: Yes People in Home [RPT]: spouse Name of Support/Comfort Primary Source: Cheryl spouse and daughter suzen  Medications Reviewed Today: Medications Reviewed Today     Reviewed by Kennieth Cathlean DEL, RN (Case Manager) on 01/27/24 at 1308  Med List Status: <None>   Medication Order Taking? Sig Documenting Provider Last Dose Status Informant  acetaminophen  (TYLENOL ) 325 MG tablet [id number] Yes Take 2 tablets (650 mg total) by mouth every 6 (six) hours as needed for mild pain (pain score 1-3) or fever (or Fever >/= 101). Dorinda Drue DASEN, MD  Active Self, Pharmacy Records, Child, Multiple Informants  ascorbic acid  (VITAMIN C ) 1000 MG tablet [id number] Yes Take 1 tablet (1,000 mg total) by mouth daily. Leak, Brandi L, NP  Active Self, Child, Pharmacy Records, Multiple Informants  atorvastatin  (LIPITOR) 10 MG tablet [id number] Yes Take 1 tablet (10 mg total) by mouth every evening. Leak, Daphne CROME, NP  Active Self, Child, Pharmacy Records, Multiple Informants  Biotin  5000 MCG TABS [id number] Yes Take 10,000 mcg by mouth in the morning.  [provider]  Active Self, Pharmacy Records, Child, Multiple Informants           Med Note STEPHEN, OLIVIA Latch Apr 10, 2017  2:56 PM)    bisacodyl  (DULCOLAX) 10 MG suppository [id number] Yes Place 1 suppository (10 mg total) rectally daily as needed for moderate constipation. Dorinda Drue DASEN, MD  Active Self, Pharmacy Records, Child, Multiple Informants  Carboxymethylcellul-Glycerin  (LUBRICATING EYE DROPS OP) [id number] Yes Place 1 drop into both eyes daily as needed (dry eyes). [provider]  Active Self, Pharmacy Records, Child, Multiple Informants  ceFEPime  (MAXIPIME ) IVPB [id number] Yes Inject 2 g into the vein every 12 (twelve) hours. Indication:  right hip PJI First Dose: No Last Day of Therapy:  03/07/24 Labs - Once weekly:  CBC/D and BMP, Labs - Once weekly: ESR and CRP Method of administration: IV Push Method of administration may be changed at the discretion of home infusion pharmacist based upon assessment of the patient and/or caregiver's ability to self-administer the medication ordered. Gherghe, Costin M, MD  Active   cholecalciferol  (VITAMIN D ) 25 MCG (1000 UNIT) tablet [id number] Yes Take 1,000 Units by mouth in the morning. [provider]  Active Self, Pharmacy Records, Child, Multiple Informants  cyclobenzaprine  (FLEXERIL ) 10 MG tablet [id number] Yes TAKE 1 TABLET BY MOUTH THREE TIMES A DAY AS NEEDED FOR MUSCLE SPASM  Patient taking differently: Take 10 mg by mouth 3 (three) times daily as needed for muscle spasms.   [name] PARAS, MD  Active Self, Pharmacy Records, Child, Multiple Informants  denosumab  (PROLIA ) 60 MG/ML SOSY injection [id number] Yes Inject 60 mg into the skin every 6 (six) months. Deliver to rheum: [address], [place], [place] [address]. Appt on 07/23/2022 Cheryl Waddell HERO, PA-C  Active Self, Pharmacy Records, Child, Multiple Informants           Med Note JACKOLYN WADDELL VEAR Pablo Oct 05, 2023  8:33 AM) Last dose 09/2023.   diazepam  (VALIUM ) 5 MG tablet [id number] Yes TAKE 1 TABLET BY MOUTH AT BEDTIME AS NEEDED FOR MUSCLE SPASMS.  Patient taking differently: Take 5 mg by mouth every 6 (six) hours as needed for anxiety, muscle spasms or sedation.   [name] PARAS, MD  Active Self, Pharmacy Records, Child, Multiple Informants  docusate sodium  (COLACE) 100 MG capsule [id number] Yes Take 1 capsule (100 mg total) by mouth daily. Dorinda Drue DASEN, MD  Active Self, Pharmacy Records, Child, Multiple Informants           Med Note (WHITE, DONETA RAMAN   Fri Nov 27, 2023 12:32 AM) Alternated with Senokot  DULoxetine  (CYMBALTA ) 20 MG capsule [id number] Yes Take 1 capsule (20 mg total) by mouth at bedtime. [name] PARAS, MD  Active Self, Child, Pharmacy Records, Multiple Informants  folic acid  (FOLVITE ) 1 MG tablet [id number] Yes Take 1 tablet (1 mg total) by mouth daily. Cheryl Waddell HERO, PA-C  Active Self, Pharmacy Records, Child, Multiple Informants  furosemide  (LASIX ) 20 MG tablet [id number] Yes Take 0.5 tablets (10 mg total) by mouth daily. Leak, Brandi L, NP  Active Self, Child, Pharmacy Records, Multiple Informants  magic mouthwash (nystatin , lidocaine , diphenhydrAMINE , alum & mag hydroxide) suspension [id number] Yes Swish and spit 5 mLs 3 (three) times daily as needed for mouth pain. [name] PARAS, MD  Active Self, Child, Pharmacy Records, Multiple Informants  methotrexate  [facility]) 2.5 MG tablet [id number] Yes Take 5 tablets (12.5 mg total) by mouth once a week. Caution:Chemotherapy. Protect from light. Cheryl Waddell HERO, PA-C  Active Self, Pharmacy Records, Child, Multiple Informants           Med Note JACKOLYN WADDELL VEAR Pablo Oct 05, 2023  8:35 AM) Wednesday's  Nystatin  (GERHARDT'S BUTT CREAM) CREA [id number] Yes Apply 1 Application topically 3 (three) times daily. [name] PARAS, MD  Active Self, Child, Pharmacy Records, Multiple Informants  oxyCODONE  (ROXICODONE ) 5 MG immediate release tablet [id number] Yes Take 1 tablet (5 mg total)  by mouth every 4 (four) hours as needed for severe pain (pain score 7-10). [address] S, PA-C  Active   pantoprazole  (PROTONIX ) 40 MG tablet [id number] Yes Take 1 tablet (40 mg total) by mouth 2 (two) times daily before a meal. Burns, Glade PARAS, MD  Active Self, Pharmacy Records, Child, Multiple Informants  polyethylene glycol powder (GLYCOLAX /MIRALAX ) 17 GM/SCOOP powder [id number] Yes Take 17 g by mouth 2 (two) times daily.  Patient taking differently: Take 17 g by mouth daily.   Leak, Daphne CROME, NP  Active Self, Child, Pharmacy Records, Multiple Informants  potassium chloride  SA (KLOR-CON  M) 20 MEQ tablet [id number] Yes Take 0.5 tablets (10 mEq total) by mouth every other day. Leak, Daphne CROME, NP  Active Self, Child, Pharmacy Records, Multiple Informants  predniSONE  (DELTASONE ) 5 MG tablet [id number]  TAKE 1 TABLET BY MOUTH EVERY DAY IN THE MORNING Cheryl Waddell HERO, PA-C  Active Self, Pharmacy Records, Child, Multiple Informants           Med Note STEFFI, ADELITA   Tue Jan 19, 2024  9:22 AM) Last filled on in February for a 90 day supply   senna (SENOKOT) 8.6 MG tablet [id number] Yes Take  1 tablet by mouth at bedtime. [provider]  Active Child, Self, Pharmacy Records, Multiple Informants  sodium chloride  1 g tablet [id number] Yes Take 1 tablet (1 g total) by mouth 2 (two) times daily with a meal. Leak, Brandi L, NP  Active Self, Child, Pharmacy Records, Multiple Informants  tamsulosin  (FLOMAX ) 0.4 MG CAPS capsule [id number] Yes Take 1 capsule (0.4 mg total) by mouth daily after supper. [name] PARAS, MD  Active Self, Child, Pharmacy Records, Multiple Informants  Upadacitinib  ER ([name] ) 15 MG TB24 [id number]  Take 15 mg by mouth daily.  Patient not taking: Reported on 01/27/2024   [provider]  Active Self, Pharmacy Records, Child, Multiple Informants           Med Note (WHITE, DONETA RAMAN   Fri Nov 27, 2023 12:57 AM) Patient given samples - currently on hold until surgeons direct pt to  resume  vancomycin  (VANCOCIN ) 1,000 mg in sodium chloride  0.9 % 500 mL IVPB [id number]   Leigh Serge S, PA-C  Active   Zinc  Sulfate 220 (50 Zn) MG TABS [id number] Yes Take 1 tablet (220 mg total) by mouth daily with supper. Leak, Daphne CROME, NP  Active Self, Child, Pharmacy Records, Multiple Informants  Med List Note Steffi Nian, CPhT 01/19/24 9073): Daughter is able to help with medications.             Home Care and Equipment/Supplies: Were Home Health Services Ordered?: Yes Name of Home Health Agency:: [facility] Has Agency set up a time to come to your home?: Yes First Home Health Visit Date: 01/27/24 Any new equipment or medical supplies ordered?: NA  Functional Questionnaire: Do you need assistance with bathing/showering or dressing?: Yes Do you need assistance with meal preparation?: Yes Do you need assistance with eating?: No Do you have difficulty maintaining continence: No Do you need assistance with getting out of bed/getting out of a chair/moving?: Yes Do you have difficulty managing or taking your medications?: No  Follow up appointments reviewed: PCP Follow-up appointment confirmed?: No MD Provider Line [phone] Given: Yes (Daughter will call to make appointment) Specialist Hospital Follow-up appointment confirmed?: Yes Date of Specialist follow-up appointment?: 01/29/24 Follow-Up Specialty Provider:: [id number] Serge Hill/ [id number] Dr Jomarie [place] Do you need transportation to your follow-up appointment?: No Do you understand care options if your condition(s) worsen?: Yes-patient verbalized understanding  SDOH Interventions Today    Flowsheet Row Most Recent Value  SDOH Interventions   Food Insecurity Interventions Intervention Not Indicated  Housing Interventions Intervention Not Indicated  Transportation Interventions Intervention Not Indicated, Patient Resources (Friends/Family)  Utilities Interventions Intervention Not Indicated     Cathlean Headland BSN RN [facility] Care Management Coordinator [email or web address] Direct Dial: [phone]  Fax: [phone] Website: [email or web address]

## 2024-01-28 ENCOUNTER — Telehealth: Payer: Self-pay

## 2024-01-28 DIAGNOSIS — K219 Gastro-esophageal reflux disease without esophagitis: Secondary | ICD-10-CM | POA: Diagnosis not present

## 2024-01-28 DIAGNOSIS — R339 Retention of urine, unspecified: Secondary | ICD-10-CM | POA: Diagnosis not present

## 2024-01-28 DIAGNOSIS — M80031D Age-related osteoporosis with current pathological fracture, right forearm, subsequent encounter for fracture with routine healing: Secondary | ICD-10-CM | POA: Diagnosis not present

## 2024-01-28 DIAGNOSIS — Z87891 Personal history of nicotine dependence: Secondary | ICD-10-CM | POA: Diagnosis not present

## 2024-01-28 DIAGNOSIS — Z9181 History of falling: Secondary | ICD-10-CM | POA: Diagnosis not present

## 2024-01-28 DIAGNOSIS — E222 Syndrome of inappropriate secretion of antidiuretic hormone: Secondary | ICD-10-CM | POA: Diagnosis not present

## 2024-01-28 DIAGNOSIS — M0689 Other specified rheumatoid arthritis, multiple sites: Secondary | ICD-10-CM | POA: Diagnosis not present

## 2024-01-28 DIAGNOSIS — H9193 Unspecified hearing loss, bilateral: Secondary | ICD-10-CM | POA: Diagnosis not present

## 2024-01-28 DIAGNOSIS — G4700 Insomnia, unspecified: Secondary | ICD-10-CM | POA: Diagnosis not present

## 2024-01-28 DIAGNOSIS — G35D Multiple sclerosis, unspecified: Secondary | ICD-10-CM | POA: Diagnosis not present

## 2024-01-28 DIAGNOSIS — M4722 Other spondylosis with radiculopathy, cervical region: Secondary | ICD-10-CM | POA: Diagnosis not present

## 2024-01-28 DIAGNOSIS — T84020D Dislocation of internal right hip prosthesis, subsequent encounter: Secondary | ICD-10-CM | POA: Diagnosis not present

## 2024-01-28 DIAGNOSIS — R1310 Dysphagia, unspecified: Secondary | ICD-10-CM | POA: Diagnosis not present

## 2024-01-28 DIAGNOSIS — D62 Acute posthemorrhagic anemia: Secondary | ICD-10-CM | POA: Diagnosis not present

## 2024-01-28 DIAGNOSIS — Z85828 Personal history of other malignant neoplasm of skin: Secondary | ICD-10-CM | POA: Diagnosis not present

## 2024-01-28 DIAGNOSIS — I10 Essential (primary) hypertension: Secondary | ICD-10-CM | POA: Diagnosis not present

## 2024-01-28 DIAGNOSIS — M4712 Other spondylosis with myelopathy, cervical region: Secondary | ICD-10-CM | POA: Diagnosis not present

## 2024-01-28 DIAGNOSIS — E876 Hypokalemia: Secondary | ICD-10-CM | POA: Diagnosis not present

## 2024-01-28 DIAGNOSIS — L89322 Pressure ulcer of left buttock, stage 2: Secondary | ICD-10-CM | POA: Diagnosis not present

## 2024-01-28 DIAGNOSIS — E785 Hyperlipidemia, unspecified: Secondary | ICD-10-CM | POA: Diagnosis not present

## 2024-01-28 NOTE — Telephone Encounter (Signed)
 She was ordered PT --- did they order Speech therapy for her or are they asking for an order?  Is it just for swallow eval / monitoring?

## 2024-01-28 NOTE — Telephone Encounter (Signed)
 Message left for Sherri [name] today.  If she calls back please clarify what Dr. Geofm has asked and let me know.  The clarification need was also left in the message as well.

## 2024-01-28 NOTE — Telephone Encounter (Signed)
 Copied from CRM [id number]. Topic: Clinical - Home Health Verbal Orders >> Jan 28, 2024 11:20 AM Roselie BROCKS wrote: Caller/Agency: [facility]  Callback Number: [phone] Service Requested: Physical Therapy Frequency: Patient came home yesturday,  Patient needs speech therapy and physcial therapy  Any new concerns about the patient? Yes Patietn had aspirations in hospital

## 2024-01-29 ENCOUNTER — Telehealth: Payer: Self-pay | Admitting: Radiology

## 2024-01-29 DIAGNOSIS — M4712 Other spondylosis with myelopathy, cervical region: Secondary | ICD-10-CM | POA: Diagnosis not present

## 2024-01-29 DIAGNOSIS — Z87891 Personal history of nicotine dependence: Secondary | ICD-10-CM | POA: Diagnosis not present

## 2024-01-29 DIAGNOSIS — T17908D Unspecified foreign body in respiratory tract, part unspecified causing other injury, subsequent encounter: Secondary | ICD-10-CM

## 2024-01-29 DIAGNOSIS — E876 Hypokalemia: Secondary | ICD-10-CM | POA: Diagnosis not present

## 2024-01-29 DIAGNOSIS — T84020D Dislocation of internal right hip prosthesis, subsequent encounter: Secondary | ICD-10-CM | POA: Diagnosis not present

## 2024-01-29 DIAGNOSIS — Z9181 History of falling: Secondary | ICD-10-CM | POA: Diagnosis not present

## 2024-01-29 DIAGNOSIS — K219 Gastro-esophageal reflux disease without esophagitis: Secondary | ICD-10-CM | POA: Diagnosis not present

## 2024-01-29 DIAGNOSIS — I10 Essential (primary) hypertension: Secondary | ICD-10-CM | POA: Diagnosis not present

## 2024-01-29 DIAGNOSIS — E785 Hyperlipidemia, unspecified: Secondary | ICD-10-CM | POA: Diagnosis not present

## 2024-01-29 DIAGNOSIS — G4700 Insomnia, unspecified: Secondary | ICD-10-CM | POA: Diagnosis not present

## 2024-01-29 DIAGNOSIS — M0689 Other specified rheumatoid arthritis, multiple sites: Secondary | ICD-10-CM | POA: Diagnosis not present

## 2024-01-29 DIAGNOSIS — R339 Retention of urine, unspecified: Secondary | ICD-10-CM | POA: Diagnosis not present

## 2024-01-29 DIAGNOSIS — H9193 Unspecified hearing loss, bilateral: Secondary | ICD-10-CM | POA: Diagnosis not present

## 2024-01-29 DIAGNOSIS — Z85828 Personal history of other malignant neoplasm of skin: Secondary | ICD-10-CM | POA: Diagnosis not present

## 2024-01-29 DIAGNOSIS — G35D Multiple sclerosis, unspecified: Secondary | ICD-10-CM | POA: Diagnosis not present

## 2024-01-29 DIAGNOSIS — E222 Syndrome of inappropriate secretion of antidiuretic hormone: Secondary | ICD-10-CM | POA: Diagnosis not present

## 2024-01-29 DIAGNOSIS — M4722 Other spondylosis with radiculopathy, cervical region: Secondary | ICD-10-CM | POA: Diagnosis not present

## 2024-01-29 DIAGNOSIS — M80031D Age-related osteoporosis with current pathological fracture, right forearm, subsequent encounter for fracture with routine healing: Secondary | ICD-10-CM | POA: Diagnosis not present

## 2024-01-29 DIAGNOSIS — R1310 Dysphagia, unspecified: Secondary | ICD-10-CM | POA: Diagnosis not present

## 2024-01-29 DIAGNOSIS — D62 Acute posthemorrhagic anemia: Secondary | ICD-10-CM | POA: Diagnosis not present

## 2024-01-29 DIAGNOSIS — L89322 Pressure ulcer of left buttock, stage 2: Secondary | ICD-10-CM | POA: Diagnosis not present

## 2024-01-29 NOTE — Telephone Encounter (Signed)
 Referral ordered

## 2024-01-29 NOTE — Telephone Encounter (Signed)
 Copied from CRM #[id number]. Topic: General - Other >> Jan 29, 2024  8:52 AM Rea ORN wrote: Reason for CRM: Ellouise with [facility] called back to advise that pt had aspirations in hospital and that is the reason for speech therapy order request for monitoring.   Callback Number: [phone]

## 2024-01-29 NOTE — Addendum Note (Signed)
 Addended by: [name] PARAS on: 01/29/2024 04:34 PM   Modules accepted: Orders

## 2024-01-30 DIAGNOSIS — T8459XA Infection and inflammatory reaction due to other internal joint prosthesis, initial encounter: Secondary | ICD-10-CM | POA: Diagnosis not present

## 2024-01-30 DIAGNOSIS — Z96649 Presence of unspecified artificial hip joint: Secondary | ICD-10-CM | POA: Diagnosis not present

## 2024-01-30 DIAGNOSIS — A498 Other bacterial infections of unspecified site: Secondary | ICD-10-CM | POA: Diagnosis not present

## 2024-02-01 ENCOUNTER — Other Ambulatory Visit: Payer: Self-pay | Admitting: Internal Medicine

## 2024-02-01 DIAGNOSIS — E876 Hypokalemia: Secondary | ICD-10-CM | POA: Diagnosis not present

## 2024-02-01 DIAGNOSIS — R1310 Dysphagia, unspecified: Secondary | ICD-10-CM | POA: Diagnosis not present

## 2024-02-01 DIAGNOSIS — D62 Acute posthemorrhagic anemia: Secondary | ICD-10-CM | POA: Diagnosis not present

## 2024-02-01 DIAGNOSIS — Z87891 Personal history of nicotine dependence: Secondary | ICD-10-CM | POA: Diagnosis not present

## 2024-02-01 DIAGNOSIS — E222 Syndrome of inappropriate secretion of antidiuretic hormone: Secondary | ICD-10-CM | POA: Diagnosis not present

## 2024-02-01 DIAGNOSIS — L89322 Pressure ulcer of left buttock, stage 2: Secondary | ICD-10-CM | POA: Diagnosis not present

## 2024-02-01 DIAGNOSIS — T8459XA Infection and inflammatory reaction due to other internal joint prosthesis, initial encounter: Secondary | ICD-10-CM | POA: Diagnosis not present

## 2024-02-01 DIAGNOSIS — M0689 Other specified rheumatoid arthritis, multiple sites: Secondary | ICD-10-CM | POA: Diagnosis not present

## 2024-02-01 DIAGNOSIS — Z9660 Presence of unspecified orthopedic joint implant: Secondary | ICD-10-CM | POA: Diagnosis not present

## 2024-02-01 DIAGNOSIS — G35D Multiple sclerosis, unspecified: Secondary | ICD-10-CM | POA: Diagnosis not present

## 2024-02-01 DIAGNOSIS — M4712 Other spondylosis with myelopathy, cervical region: Secondary | ICD-10-CM | POA: Diagnosis not present

## 2024-02-01 DIAGNOSIS — T84020D Dislocation of internal right hip prosthesis, subsequent encounter: Secondary | ICD-10-CM | POA: Diagnosis not present

## 2024-02-01 DIAGNOSIS — M4722 Other spondylosis with radiculopathy, cervical region: Secondary | ICD-10-CM | POA: Diagnosis not present

## 2024-02-01 DIAGNOSIS — G4700 Insomnia, unspecified: Secondary | ICD-10-CM | POA: Diagnosis not present

## 2024-02-01 DIAGNOSIS — Z85828 Personal history of other malignant neoplasm of skin: Secondary | ICD-10-CM | POA: Diagnosis not present

## 2024-02-01 DIAGNOSIS — R339 Retention of urine, unspecified: Secondary | ICD-10-CM | POA: Diagnosis not present

## 2024-02-01 DIAGNOSIS — K219 Gastro-esophageal reflux disease without esophagitis: Secondary | ICD-10-CM | POA: Diagnosis not present

## 2024-02-01 DIAGNOSIS — H9193 Unspecified hearing loss, bilateral: Secondary | ICD-10-CM | POA: Diagnosis not present

## 2024-02-01 DIAGNOSIS — I10 Essential (primary) hypertension: Secondary | ICD-10-CM | POA: Diagnosis not present

## 2024-02-01 DIAGNOSIS — Z9181 History of falling: Secondary | ICD-10-CM | POA: Diagnosis not present

## 2024-02-01 DIAGNOSIS — E785 Hyperlipidemia, unspecified: Secondary | ICD-10-CM | POA: Diagnosis not present

## 2024-02-01 DIAGNOSIS — M80031D Age-related osteoporosis with current pathological fracture, right forearm, subsequent encounter for fracture with routine healing: Secondary | ICD-10-CM | POA: Diagnosis not present

## 2024-02-01 LAB — LAB REPORT - SCANNED: EGFR: 90

## 2024-02-01 NOTE — Telephone Encounter (Signed)
 Message left for Ellouise today that referral had been placed.

## 2024-02-01 NOTE — Telephone Encounter (Unsigned)
 Copied from CRM [id number]. Topic: Clinical - Medication Refill >> Feb 01, 2024  2:20 PM Turkey A wrote: Medication: sodium chloride  1 g tablet  Has the patient contacted their pharmacy? Yes (Agent: If no, request that the patient contact the pharmacy for the refill. If patient does not wish to contact the pharmacy document the reason why and proceed with request.) (Agent: If yes, when and what did the pharmacy advise?)  This is the patient's preferred pharmacy:  [id number] [place], [place] - [address] [place] [address] Phone: [phone] Fax: [phone]  Is this the correct pharmacy for this prescription? Yes If no, delete pharmacy and type the correct one.   Has the prescription been filled recently? No  Is the patient out of the medication? No two pills left  Has the patient been seen for an appointment in the last year OR does the patient have an upcoming appointment? Yes  Can we respond through MyChart? Yes  Agent: Please be advised that Rx refills may take up to 3 business days. We ask that you follow-up with your pharmacy.

## 2024-02-02 ENCOUNTER — Encounter: Payer: Self-pay | Admitting: Internal Medicine

## 2024-02-02 NOTE — Patient Instructions (Incomplete)
        Medications changes include :   None

## 2024-02-02 NOTE — Progress Notes (Unsigned)
 Subjective:    Patient ID: Sherri  [name], female    DOB: November 14, 1940, 83 y.o.   MRN: [id number]     HPI Sherri [name]  is here for follow up from the hospital.  Sherri [name] is here with her son and home health aid.  They both help supplement the history  Admitted 8/26 - 9/2  Admitted from home for extreme right hip pain  History: Right olecranon fx 5/14 R femoral neck fx 6/17 R total hip arthroplasty 6/19 Dislocated total hip arthroplasty 6/22 Closed reduction right total hip arthroplasty  6/23 ORIF R olecranon 6/27 Subsequent  I& D w/ hardware removal and olecranon extension w/ triceps advancement  7/5 R hip dislocation unable to be reduced  7/7 Total hip arthroplasty revision w/ head, ball and liner change by Dr Fidel on 7/10  She had severe right hip pain when she woke up from a nap.  Hip found to be dislocated and attempt to reduce dislocation in ED were not successful.  Ortho was consulted.  She received pain medication.  Hgb 9, Na 131, Cl 97, Ca 8.1   Right hip dislocation -  Went to OR on 8/28 - Dr Fidel S/p R total hip revision, head ball and liner exchange PT recommended SNF, but husband wanted her home  Discharged to home with portable wound VAC - to be removed in ortho office and a drain   Right hip wound with pseudomonas -  Intraoperative cultures from wound overlying hip growing pseudomonas ID consulted Placed on cefepine x 6 weeks Will likely need supressive Cipro  following that  Chronic hyponatremia -  Continue salt tabs Na varying but stable  Chronic anemia, acute blood loss anemia -  Hgb dipped to 7.1 on 8/29, post-op S/p 1 units PRBC - hgb improved Hgb stable  No bleeding  Dysphagia -  Chronic, continue dysphagia diet RA - Continue methotrexate  Hypokalemia - repleted -resolved Hypomagnesemia - repleted, resolved Hyperlipidemia - continue statin Chronic pain - continue cymbalta  History of MS - not on active treatment   Discharged to  home with PT, RN.  Has 24 x 7 care.  Has not started PT.   Today transferred to bed to wheelchair with assistance - just got go ahead from ortho to bear full weight on right leg.    Denies pain in leg.  Taking pain medication.     Buttock sores.  No infection.  They are improving.    PICC line - cefepime  - has appt with ID.   Has foley catheter -- has f/u with urology 9/22 -  trying to move it up.    Eating good but not much- pushing the protein.    She is still very weak.    Medications and allergies reviewed with patient and updated if appropriate.  Current Outpatient Medications on File Prior to Visit  Medication Sig Dispense Refill   acetaminophen  (TYLENOL ) 325 MG tablet Take 2 tablets (650 mg total) by mouth every 6 (six) hours as needed for mild pain (pain score 1-3) or fever (or Fever >/= 101). 20 tablet 0   ascorbic acid  (VITAMIN C ) 1000 MG tablet Take 1 tablet (1,000 mg total) by mouth daily. 100 tablet 0   atorvastatin  (LIPITOR) 10 MG tablet Take 1 tablet (10 mg total) by mouth every evening. 30 tablet 0   Biotin  5000 MCG TABS Take 10,000 mcg by mouth in the morning.     bisacodyl  (DULCOLAX) 10 MG suppository Place 1 suppository (10 mg  total) rectally daily as needed for moderate constipation. 12 suppository 0   Carboxymethylcellul-Glycerin  (LUBRICATING EYE DROPS OP) Place 1 drop into both eyes daily as needed (dry eyes).     ceFEPime  (MAXIPIME ) IVPB Inject 2 g into the vein every 12 (twelve) hours. Indication:  right hip PJI First Dose: No Last Day of Therapy:  03/07/24 Labs - Once weekly:  CBC/D and BMP, Labs - Once weekly: ESR and CRP Method of administration: IV Push Method of administration may be changed at the discretion of home infusion pharmacist based upon assessment of the patient and/or caregiver's ability to self-administer the medication ordered. 83 Units 0   cholecalciferol  (VITAMIN D ) 25 MCG (1000 UNIT) tablet Take 1,000 Units by mouth in the morning.      cyclobenzaprine  (FLEXERIL ) 10 MG tablet TAKE 1 TABLET BY MOUTH THREE TIMES A DAY AS NEEDED FOR MUSCLE SPASM (Patient taking differently: Take 10 mg by mouth 3 (three) times daily as needed for muscle spasms.) 90 tablet 2   denosumab  (PROLIA ) 60 MG/ML SOSY injection Inject 60 mg into the skin every 6 (six) months. Deliver to rheum: [address], [place], [place] [address]. Appt on 07/23/2022 1 mL 0   diazepam  (VALIUM ) 5 MG tablet TAKE 1 TABLET BY MOUTH AT BEDTIME AS NEEDED FOR MUSCLE SPASMS. (Patient taking differently: Take 5 mg by mouth every 6 (six) hours as needed for anxiety, muscle spasms or sedation.) 30 tablet 5   docusate sodium  (COLACE) 100 MG capsule Take 1 capsule (100 mg total) by mouth daily. 10 capsule 0   DULoxetine  (CYMBALTA ) 20 MG capsule Take 1 capsule (20 mg total) by mouth at bedtime. 30 capsule 2   folic acid  (FOLVITE ) 1 MG tablet Take 1 tablet (1 mg total) by mouth daily. 90 tablet 3   furosemide  (LASIX ) 20 MG tablet Take 0.5 tablets (10 mg total) by mouth daily. 30 tablet 0   magic mouthwash (nystatin , lidocaine , diphenhydrAMINE , alum & mag hydroxide) suspension Swish and spit 5 mLs 3 (three) times daily as needed for mouth pain. 180 mL 0   methotrexate  (RHEUMATREX) 2.5 MG tablet Take 5 tablets (12.5 mg total) by mouth once a week. Caution:Chemotherapy. Protect from light. 60 tablet 0   Nystatin  (GERHARDT'S BUTT CREAM) CREA Apply 1 Application topically 3 (three) times daily. 60 each 0   oxyCODONE  (ROXICODONE ) 5 MG immediate release tablet Take 1 tablet (5 mg total) by mouth every 4 (four) hours as needed for severe pain (pain score 7-10). 42 tablet 0   pantoprazole  (PROTONIX ) 40 MG tablet Take 1 tablet (40 mg total) by mouth 2 (two) times daily before a meal. 60 tablet 5   polyethylene glycol powder (GLYCOLAX /MIRALAX ) 17 GM/SCOOP powder Take 17 g by mouth 2 (two) times daily. (Patient taking differently: Take 17 g by mouth daily.) 238 g 0   potassium chloride  SA (KLOR-CON   M) 20 MEQ tablet Take 0.5 tablets (10 mEq total) by mouth every other day. 25 tablet 0   predniSONE  (DELTASONE ) 5 MG tablet TAKE 1 TABLET BY MOUTH EVERY DAY IN THE MORNING 90 tablet 0   senna (SENOKOT) 8.6 MG tablet Take 1 tablet by mouth at bedtime.     sodium chloride  1 g tablet Take 1 tablet (1 g total) by mouth 2 (two) times daily with a meal. 60 tablet 0   tamsulosin  (FLOMAX ) 0.4 MG CAPS capsule Take 1 capsule (0.4 mg total) by mouth daily after supper. 30 capsule 0   [Paused] Upadacitinib  ER (  [name] ) 15 MG TB24 Take 15 mg by mouth daily.     Zinc  Sulfate 220 (50 Zn) MG TABS Take 1 tablet (220 mg total) by mouth daily with supper. 100 tablet 0   Current Facility-Administered Medications on File Prior to Visit  Medication Dose Route Frequency Provider Last Rate Last Admin   vancomycin  (VANCOCIN ) 1,000 mg in sodium chloride  0.9 % 500 mL IVPB  1,000 mg Intravenous Once Leigh Valery RAMAN, PA-C         Review of Systems  Constitutional:  Negative for appetite change and fever.  Respiratory:  Positive for cough, shortness of breath (sometimes) and wheezing.   Cardiovascular:  Positive for leg swelling. Negative for chest pain and palpitations.  Gastrointestinal:  Negative for abdominal pain and constipation.  Neurological:  Positive for light-headedness (a little). Negative for headaches.  Psychiatric/Behavioral:  Negative for sleep disturbance.        Objective:   Vitals:   02/03/24 1342  BP: 124/68  Pulse: (!) 103  Temp: 98 F (36.7 C)  SpO2: 95%   BP Readings from Last 3 Encounters:  02/03/24 124/68  01/26/24 (!) 143/67  12/31/23 104/64   Wt Readings from Last 3 Encounters:  02/03/24 97 lb 8 oz (44.2 kg)  01/21/24 103 lb 9.9 oz (47 kg)  12/22/23 104 lb 15 oz (47.6 kg)   Body mass index is 18.42 kg/m.    Physical Exam Constitutional:      General: She is not in acute distress.    Comments: Chronically ill appearing  HENT:     Head: Normocephalic and atraumatic.   Eyes:     Conjunctiva/sclera: Conjunctivae normal.  Cardiovascular:     Rate and Rhythm: Regular rhythm. Tachycardia present.     Heart sounds: Normal heart sounds.  Pulmonary:     Effort: Pulmonary effort is normal. No respiratory distress.     Breath sounds: Normal breath sounds. No wheezing.  Abdominal:     General: There is no distension.     Palpations: Abdomen is soft.     Tenderness: There is no abdominal tenderness.  Genitourinary:    Comments: Foley in place Musculoskeletal:     Cervical back: Neck supple.     Right lower leg: No edema.     Left lower leg: No edema.  Lymphadenopathy:     Cervical: No cervical adenopathy.  Skin:    General: Skin is warm and dry.     Findings: Bruising (numerous bruising b/l arms, legs) present. No rash.  Neurological:     Mental Status: She is alert. Mental status is at baseline.  Psychiatric:        Mood and Affect: Mood normal.        Behavior: Behavior normal.        Lab Results  Component Value Date   WBC 8.0 01/26/2024   HGB 8.1 (L) 01/26/2024   HCT 25.4 (L) 01/26/2024   PLT 265 01/26/2024   GLUCOSE 78 01/26/2024   CHOL 238 (H) 08/06/2023   TRIG 53 08/06/2023   HDL 109 08/06/2023   LDLDIRECT 122.9 11/15/2012   LDLCALC 115 (H) 08/06/2023   ALT 7 01/26/2024   AST 17 01/26/2024   NA 132 (L) 01/26/2024   K 3.7 01/26/2024   CL 94 (L) 01/26/2024   CREATININE 0.47 01/26/2024   BUN <5 (L) 01/26/2024   CO2 29 01/26/2024   TSH 3.196 11/27/2023   INR 1.1 10/02/2023   HGBA1C 6.0 (H) 08/06/2023  Assessment & Plan:    See Problem List for Assessment and Plan of chronic medical problems.

## 2024-02-03 ENCOUNTER — Ambulatory Visit (INDEPENDENT_AMBULATORY_CARE_PROVIDER_SITE_OTHER): Admitting: Internal Medicine

## 2024-02-03 VITALS — BP 124/68 | HR 103 | Temp 98.0°F | Ht 61.0 in | Wt 97.5 lb

## 2024-02-03 DIAGNOSIS — E871 Hypo-osmolality and hyponatremia: Secondary | ICD-10-CM

## 2024-02-03 DIAGNOSIS — R1310 Dysphagia, unspecified: Secondary | ICD-10-CM

## 2024-02-03 DIAGNOSIS — A498 Other bacterial infections of unspecified site: Secondary | ICD-10-CM | POA: Diagnosis not present

## 2024-02-03 DIAGNOSIS — T8459XD Infection and inflammatory reaction due to other internal joint prosthesis, subsequent encounter: Secondary | ICD-10-CM | POA: Diagnosis not present

## 2024-02-03 DIAGNOSIS — R5383 Other fatigue: Secondary | ICD-10-CM

## 2024-02-03 DIAGNOSIS — R5381 Other malaise: Secondary | ICD-10-CM

## 2024-02-03 DIAGNOSIS — Z978 Presence of other specified devices: Secondary | ICD-10-CM | POA: Insufficient documentation

## 2024-02-03 DIAGNOSIS — M0579 Rheumatoid arthritis with rheumatoid factor of multiple sites without organ or systems involvement: Secondary | ICD-10-CM | POA: Diagnosis not present

## 2024-02-03 DIAGNOSIS — S73004D Unspecified dislocation of right hip, subsequent encounter: Secondary | ICD-10-CM

## 2024-02-03 DIAGNOSIS — Z96649 Presence of unspecified artificial hip joint: Secondary | ICD-10-CM | POA: Diagnosis not present

## 2024-02-03 MED ORDER — SODIUM CHLORIDE 1 G PO TABS: 1.0000 g | ORAL_TABLET | Freq: Two times a day (BID) | ORAL | 2 refills | Status: DC

## 2024-02-03 NOTE — Assessment & Plan Note (Signed)
 Subacute Mild Not quite eating/drinking normally Continue sodium chloride  1 g tablets twice daily As she continues to increase her oral intake hopefully can discontinue

## 2024-02-03 NOTE — Assessment & Plan Note (Signed)
 Following with Dr. Dolphus [name]  on hold due to Pseudomonas infection

## 2024-02-03 NOTE — Assessment & Plan Note (Signed)
 Foley catheter in place Close for bladder retention Has urology follow-up in a couple of weeks-family trying to move this up so that she can get the Foley removed sooner

## 2024-02-03 NOTE — Assessment & Plan Note (Signed)
 Recent hip dislocation that was not able to be reduced Ended up having total hip arthroplasty revision with head, ball and liner To start physical therapy-cleared by orthopedics to bear full weight on right leg

## 2024-02-03 NOTE — Assessment & Plan Note (Signed)
 Chronic Advised dysphagia diet, but eating normal foods She does have some coughing

## 2024-02-03 NOTE — Assessment & Plan Note (Signed)
 Intraoperative cultures from wound overlying hip grew Pseudomonas Has PICC line Receiving cefepime  at home Has ID follow-up

## 2024-02-03 NOTE — Assessment & Plan Note (Signed)
 Chronic As a result of multiple hospitalizations, multiple hip surgeries To start PT Given permission by endocrine to bear full weight on right leg

## 2024-02-03 NOTE — Assessment & Plan Note (Signed)
 Subacute Related to several hospitalizations and infections At home with 24 x 7 hr care To start PT I think she will be getting stronger but it will take a while.

## 2024-02-04 DIAGNOSIS — K219 Gastro-esophageal reflux disease without esophagitis: Secondary | ICD-10-CM | POA: Diagnosis not present

## 2024-02-04 DIAGNOSIS — E876 Hypokalemia: Secondary | ICD-10-CM | POA: Diagnosis not present

## 2024-02-04 DIAGNOSIS — M4712 Other spondylosis with myelopathy, cervical region: Secondary | ICD-10-CM | POA: Diagnosis not present

## 2024-02-04 DIAGNOSIS — M80031D Age-related osteoporosis with current pathological fracture, right forearm, subsequent encounter for fracture with routine healing: Secondary | ICD-10-CM | POA: Diagnosis not present

## 2024-02-04 DIAGNOSIS — D62 Acute posthemorrhagic anemia: Secondary | ICD-10-CM | POA: Diagnosis not present

## 2024-02-04 DIAGNOSIS — H9193 Unspecified hearing loss, bilateral: Secondary | ICD-10-CM | POA: Diagnosis not present

## 2024-02-04 DIAGNOSIS — Z9181 History of falling: Secondary | ICD-10-CM | POA: Diagnosis not present

## 2024-02-04 DIAGNOSIS — E222 Syndrome of inappropriate secretion of antidiuretic hormone: Secondary | ICD-10-CM | POA: Diagnosis not present

## 2024-02-04 DIAGNOSIS — G35D Multiple sclerosis, unspecified: Secondary | ICD-10-CM | POA: Diagnosis not present

## 2024-02-04 DIAGNOSIS — G4700 Insomnia, unspecified: Secondary | ICD-10-CM | POA: Diagnosis not present

## 2024-02-04 DIAGNOSIS — R1310 Dysphagia, unspecified: Secondary | ICD-10-CM | POA: Diagnosis not present

## 2024-02-04 DIAGNOSIS — T84020D Dislocation of internal right hip prosthesis, subsequent encounter: Secondary | ICD-10-CM | POA: Diagnosis not present

## 2024-02-04 DIAGNOSIS — Z85828 Personal history of other malignant neoplasm of skin: Secondary | ICD-10-CM | POA: Diagnosis not present

## 2024-02-04 DIAGNOSIS — Z87891 Personal history of nicotine dependence: Secondary | ICD-10-CM | POA: Diagnosis not present

## 2024-02-04 DIAGNOSIS — M4722 Other spondylosis with radiculopathy, cervical region: Secondary | ICD-10-CM | POA: Diagnosis not present

## 2024-02-04 DIAGNOSIS — R339 Retention of urine, unspecified: Secondary | ICD-10-CM | POA: Diagnosis not present

## 2024-02-04 DIAGNOSIS — E785 Hyperlipidemia, unspecified: Secondary | ICD-10-CM | POA: Diagnosis not present

## 2024-02-04 DIAGNOSIS — I10 Essential (primary) hypertension: Secondary | ICD-10-CM | POA: Diagnosis not present

## 2024-02-04 DIAGNOSIS — M0689 Other specified rheumatoid arthritis, multiple sites: Secondary | ICD-10-CM | POA: Diagnosis not present

## 2024-02-04 DIAGNOSIS — L89322 Pressure ulcer of left buttock, stage 2: Secondary | ICD-10-CM | POA: Diagnosis not present

## 2024-02-05 DIAGNOSIS — G35D Multiple sclerosis, unspecified: Secondary | ICD-10-CM | POA: Diagnosis not present

## 2024-02-05 DIAGNOSIS — E785 Hyperlipidemia, unspecified: Secondary | ICD-10-CM | POA: Diagnosis not present

## 2024-02-05 DIAGNOSIS — T8451XA Infection and inflammatory reaction due to internal right hip prosthesis, initial encounter: Secondary | ICD-10-CM | POA: Diagnosis not present

## 2024-02-05 DIAGNOSIS — B965 Pseudomonas (aeruginosa) (mallei) (pseudomallei) as the cause of diseases classified elsewhere: Secondary | ICD-10-CM | POA: Diagnosis not present

## 2024-02-05 DIAGNOSIS — Z9181 History of falling: Secondary | ICD-10-CM | POA: Diagnosis not present

## 2024-02-05 DIAGNOSIS — I10 Essential (primary) hypertension: Secondary | ICD-10-CM | POA: Diagnosis not present

## 2024-02-05 DIAGNOSIS — K219 Gastro-esophageal reflux disease without esophagitis: Secondary | ICD-10-CM | POA: Diagnosis not present

## 2024-02-05 DIAGNOSIS — L89322 Pressure ulcer of left buttock, stage 2: Secondary | ICD-10-CM | POA: Diagnosis not present

## 2024-02-05 DIAGNOSIS — F02818 Dementia in other diseases classified elsewhere, unspecified severity, with other behavioral disturbance: Secondary | ICD-10-CM

## 2024-02-05 DIAGNOSIS — D62 Acute posthemorrhagic anemia: Secondary | ICD-10-CM | POA: Diagnosis not present

## 2024-02-05 DIAGNOSIS — T8459XA Infection and inflammatory reaction due to other internal joint prosthesis, initial encounter: Secondary | ICD-10-CM | POA: Diagnosis not present

## 2024-02-05 DIAGNOSIS — M80031D Age-related osteoporosis with current pathological fracture, right forearm, subsequent encounter for fracture with routine healing: Secondary | ICD-10-CM | POA: Diagnosis not present

## 2024-02-05 DIAGNOSIS — T84020D Dislocation of internal right hip prosthesis, subsequent encounter: Secondary | ICD-10-CM | POA: Diagnosis not present

## 2024-02-05 DIAGNOSIS — M4712 Other spondylosis with myelopathy, cervical region: Secondary | ICD-10-CM | POA: Diagnosis not present

## 2024-02-05 DIAGNOSIS — M0689 Other specified rheumatoid arthritis, multiple sites: Secondary | ICD-10-CM | POA: Diagnosis not present

## 2024-02-05 DIAGNOSIS — E876 Hypokalemia: Secondary | ICD-10-CM | POA: Diagnosis not present

## 2024-02-05 DIAGNOSIS — H9193 Unspecified hearing loss, bilateral: Secondary | ICD-10-CM | POA: Diagnosis not present

## 2024-02-05 DIAGNOSIS — G4700 Insomnia, unspecified: Secondary | ICD-10-CM | POA: Diagnosis not present

## 2024-02-05 DIAGNOSIS — Z87891 Personal history of nicotine dependence: Secondary | ICD-10-CM | POA: Diagnosis not present

## 2024-02-05 DIAGNOSIS — R339 Retention of urine, unspecified: Secondary | ICD-10-CM | POA: Diagnosis not present

## 2024-02-05 DIAGNOSIS — Z85828 Personal history of other malignant neoplasm of skin: Secondary | ICD-10-CM | POA: Diagnosis not present

## 2024-02-05 DIAGNOSIS — E222 Syndrome of inappropriate secretion of antidiuretic hormone: Secondary | ICD-10-CM | POA: Diagnosis not present

## 2024-02-05 DIAGNOSIS — Z96649 Presence of unspecified artificial hip joint: Secondary | ICD-10-CM | POA: Diagnosis not present

## 2024-02-05 DIAGNOSIS — M4722 Other spondylosis with radiculopathy, cervical region: Secondary | ICD-10-CM | POA: Diagnosis not present

## 2024-02-05 DIAGNOSIS — A498 Other bacterial infections of unspecified site: Secondary | ICD-10-CM | POA: Diagnosis not present

## 2024-02-05 DIAGNOSIS — F0284 Dementia in other diseases classified elsewhere, unspecified severity, with anxiety: Secondary | ICD-10-CM

## 2024-02-05 DIAGNOSIS — G35 Multiple sclerosis: Secondary | ICD-10-CM | POA: Diagnosis not present

## 2024-02-05 DIAGNOSIS — T84020A Dislocation of internal right hip prosthesis, initial encounter: Secondary | ICD-10-CM | POA: Diagnosis not present

## 2024-02-05 DIAGNOSIS — R1310 Dysphagia, unspecified: Secondary | ICD-10-CM | POA: Diagnosis not present

## 2024-02-08 DIAGNOSIS — E222 Syndrome of inappropriate secretion of antidiuretic hormone: Secondary | ICD-10-CM | POA: Diagnosis not present

## 2024-02-08 DIAGNOSIS — Z9181 History of falling: Secondary | ICD-10-CM | POA: Diagnosis not present

## 2024-02-08 DIAGNOSIS — R339 Retention of urine, unspecified: Secondary | ICD-10-CM | POA: Diagnosis not present

## 2024-02-08 DIAGNOSIS — T84020D Dislocation of internal right hip prosthesis, subsequent encounter: Secondary | ICD-10-CM | POA: Diagnosis not present

## 2024-02-08 DIAGNOSIS — M0689 Other specified rheumatoid arthritis, multiple sites: Secondary | ICD-10-CM | POA: Diagnosis not present

## 2024-02-08 DIAGNOSIS — M4722 Other spondylosis with radiculopathy, cervical region: Secondary | ICD-10-CM | POA: Diagnosis not present

## 2024-02-08 DIAGNOSIS — Z85828 Personal history of other malignant neoplasm of skin: Secondary | ICD-10-CM | POA: Diagnosis not present

## 2024-02-08 DIAGNOSIS — I10 Essential (primary) hypertension: Secondary | ICD-10-CM | POA: Diagnosis not present

## 2024-02-08 DIAGNOSIS — H9193 Unspecified hearing loss, bilateral: Secondary | ICD-10-CM | POA: Diagnosis not present

## 2024-02-08 DIAGNOSIS — E876 Hypokalemia: Secondary | ICD-10-CM | POA: Diagnosis not present

## 2024-02-08 DIAGNOSIS — L89322 Pressure ulcer of left buttock, stage 2: Secondary | ICD-10-CM | POA: Diagnosis not present

## 2024-02-08 DIAGNOSIS — E785 Hyperlipidemia, unspecified: Secondary | ICD-10-CM | POA: Diagnosis not present

## 2024-02-08 DIAGNOSIS — M80031D Age-related osteoporosis with current pathological fracture, right forearm, subsequent encounter for fracture with routine healing: Secondary | ICD-10-CM | POA: Diagnosis not present

## 2024-02-08 DIAGNOSIS — M4712 Other spondylosis with myelopathy, cervical region: Secondary | ICD-10-CM | POA: Diagnosis not present

## 2024-02-08 DIAGNOSIS — G35D Multiple sclerosis, unspecified: Secondary | ICD-10-CM | POA: Diagnosis not present

## 2024-02-08 DIAGNOSIS — K219 Gastro-esophageal reflux disease without esophagitis: Secondary | ICD-10-CM | POA: Diagnosis not present

## 2024-02-08 DIAGNOSIS — R1310 Dysphagia, unspecified: Secondary | ICD-10-CM | POA: Diagnosis not present

## 2024-02-08 DIAGNOSIS — G4700 Insomnia, unspecified: Secondary | ICD-10-CM | POA: Diagnosis not present

## 2024-02-08 DIAGNOSIS — D62 Acute posthemorrhagic anemia: Secondary | ICD-10-CM | POA: Diagnosis not present

## 2024-02-08 DIAGNOSIS — Z87891 Personal history of nicotine dependence: Secondary | ICD-10-CM | POA: Diagnosis not present

## 2024-02-08 LAB — LAB REPORT - SCANNED

## 2024-02-09 ENCOUNTER — Other Ambulatory Visit: Payer: Self-pay | Admitting: Internal Medicine

## 2024-02-09 DIAGNOSIS — M4712 Other spondylosis with myelopathy, cervical region: Secondary | ICD-10-CM | POA: Diagnosis not present

## 2024-02-09 DIAGNOSIS — I10 Essential (primary) hypertension: Secondary | ICD-10-CM | POA: Diagnosis not present

## 2024-02-09 DIAGNOSIS — G4700 Insomnia, unspecified: Secondary | ICD-10-CM | POA: Diagnosis not present

## 2024-02-09 DIAGNOSIS — T84020D Dislocation of internal right hip prosthesis, subsequent encounter: Secondary | ICD-10-CM | POA: Diagnosis not present

## 2024-02-09 DIAGNOSIS — M80031D Age-related osteoporosis with current pathological fracture, right forearm, subsequent encounter for fracture with routine healing: Secondary | ICD-10-CM | POA: Diagnosis not present

## 2024-02-09 DIAGNOSIS — M0689 Other specified rheumatoid arthritis, multiple sites: Secondary | ICD-10-CM | POA: Diagnosis not present

## 2024-02-09 DIAGNOSIS — Z87891 Personal history of nicotine dependence: Secondary | ICD-10-CM | POA: Diagnosis not present

## 2024-02-09 DIAGNOSIS — H9193 Unspecified hearing loss, bilateral: Secondary | ICD-10-CM | POA: Diagnosis not present

## 2024-02-09 DIAGNOSIS — Z85828 Personal history of other malignant neoplasm of skin: Secondary | ICD-10-CM | POA: Diagnosis not present

## 2024-02-09 DIAGNOSIS — Z9181 History of falling: Secondary | ICD-10-CM | POA: Diagnosis not present

## 2024-02-09 DIAGNOSIS — R1310 Dysphagia, unspecified: Secondary | ICD-10-CM | POA: Diagnosis not present

## 2024-02-09 DIAGNOSIS — D62 Acute posthemorrhagic anemia: Secondary | ICD-10-CM | POA: Diagnosis not present

## 2024-02-09 DIAGNOSIS — E222 Syndrome of inappropriate secretion of antidiuretic hormone: Secondary | ICD-10-CM | POA: Diagnosis not present

## 2024-02-09 DIAGNOSIS — R339 Retention of urine, unspecified: Secondary | ICD-10-CM | POA: Diagnosis not present

## 2024-02-09 DIAGNOSIS — E785 Hyperlipidemia, unspecified: Secondary | ICD-10-CM | POA: Diagnosis not present

## 2024-02-09 DIAGNOSIS — G35D Multiple sclerosis, unspecified: Secondary | ICD-10-CM | POA: Diagnosis not present

## 2024-02-09 DIAGNOSIS — L89322 Pressure ulcer of left buttock, stage 2: Secondary | ICD-10-CM | POA: Diagnosis not present

## 2024-02-09 DIAGNOSIS — E876 Hypokalemia: Secondary | ICD-10-CM | POA: Diagnosis not present

## 2024-02-09 DIAGNOSIS — K219 Gastro-esophageal reflux disease without esophagitis: Secondary | ICD-10-CM | POA: Diagnosis not present

## 2024-02-09 DIAGNOSIS — M4722 Other spondylosis with radiculopathy, cervical region: Secondary | ICD-10-CM | POA: Diagnosis not present

## 2024-02-10 ENCOUNTER — Telehealth: Payer: Self-pay | Admitting: Pharmacist

## 2024-02-10 DIAGNOSIS — M0689 Other specified rheumatoid arthritis, multiple sites: Secondary | ICD-10-CM | POA: Diagnosis not present

## 2024-02-10 DIAGNOSIS — Z9181 History of falling: Secondary | ICD-10-CM | POA: Diagnosis not present

## 2024-02-10 DIAGNOSIS — L89322 Pressure ulcer of left buttock, stage 2: Secondary | ICD-10-CM | POA: Diagnosis not present

## 2024-02-10 DIAGNOSIS — H9193 Unspecified hearing loss, bilateral: Secondary | ICD-10-CM | POA: Diagnosis not present

## 2024-02-10 DIAGNOSIS — E222 Syndrome of inappropriate secretion of antidiuretic hormone: Secondary | ICD-10-CM | POA: Diagnosis not present

## 2024-02-10 DIAGNOSIS — G4700 Insomnia, unspecified: Secondary | ICD-10-CM | POA: Diagnosis not present

## 2024-02-10 DIAGNOSIS — I10 Essential (primary) hypertension: Secondary | ICD-10-CM | POA: Diagnosis not present

## 2024-02-10 DIAGNOSIS — G35D Multiple sclerosis, unspecified: Secondary | ICD-10-CM | POA: Diagnosis not present

## 2024-02-10 DIAGNOSIS — D62 Acute posthemorrhagic anemia: Secondary | ICD-10-CM | POA: Diagnosis not present

## 2024-02-10 DIAGNOSIS — K219 Gastro-esophageal reflux disease without esophagitis: Secondary | ICD-10-CM | POA: Diagnosis not present

## 2024-02-10 DIAGNOSIS — M4712 Other spondylosis with myelopathy, cervical region: Secondary | ICD-10-CM | POA: Diagnosis not present

## 2024-02-10 DIAGNOSIS — Z85828 Personal history of other malignant neoplasm of skin: Secondary | ICD-10-CM | POA: Diagnosis not present

## 2024-02-10 DIAGNOSIS — R1310 Dysphagia, unspecified: Secondary | ICD-10-CM | POA: Diagnosis not present

## 2024-02-10 DIAGNOSIS — M4722 Other spondylosis with radiculopathy, cervical region: Secondary | ICD-10-CM | POA: Diagnosis not present

## 2024-02-10 DIAGNOSIS — M80031D Age-related osteoporosis with current pathological fracture, right forearm, subsequent encounter for fracture with routine healing: Secondary | ICD-10-CM | POA: Diagnosis not present

## 2024-02-10 DIAGNOSIS — T84020D Dislocation of internal right hip prosthesis, subsequent encounter: Secondary | ICD-10-CM | POA: Diagnosis not present

## 2024-02-10 DIAGNOSIS — Z87891 Personal history of nicotine dependence: Secondary | ICD-10-CM | POA: Diagnosis not present

## 2024-02-10 DIAGNOSIS — R339 Retention of urine, unspecified: Secondary | ICD-10-CM | POA: Diagnosis not present

## 2024-02-10 DIAGNOSIS — E785 Hyperlipidemia, unspecified: Secondary | ICD-10-CM | POA: Diagnosis not present

## 2024-02-10 DIAGNOSIS — E876 Hypokalemia: Secondary | ICD-10-CM | POA: Diagnosis not present

## 2024-02-10 NOTE — Telephone Encounter (Signed)
 Patient due for Prolia  on 03/12/24. It it scheduled for 03/15/24 @ [facility].  Of note, she has had a closed dislocation of right hip on 01/18/2024  BMP on 02/08/24  Sherry Pennant, PharmD, MPH, BCPS, CPP Clinical Pharmacist [facility])

## 2024-02-11 ENCOUNTER — Other Ambulatory Visit (HOSPITAL_COMMUNITY): Payer: Self-pay

## 2024-02-11 ENCOUNTER — Telehealth (INDEPENDENT_AMBULATORY_CARE_PROVIDER_SITE_OTHER): Payer: Self-pay | Admitting: Audiology

## 2024-02-11 ENCOUNTER — Ambulatory Visit (INDEPENDENT_AMBULATORY_CARE_PROVIDER_SITE_OTHER): Payer: Self-pay | Admitting: Audiology

## 2024-02-11 ENCOUNTER — Telehealth: Payer: Self-pay

## 2024-02-11 DIAGNOSIS — A498 Other bacterial infections of unspecified site: Secondary | ICD-10-CM | POA: Diagnosis not present

## 2024-02-11 DIAGNOSIS — H903 Sensorineural hearing loss, bilateral: Secondary | ICD-10-CM

## 2024-02-11 DIAGNOSIS — T8459XA Infection and inflammatory reaction due to other internal joint prosthesis, initial encounter: Secondary | ICD-10-CM | POA: Diagnosis not present

## 2024-02-11 DIAGNOSIS — Z96649 Presence of unspecified artificial hip joint: Secondary | ICD-10-CM | POA: Diagnosis not present

## 2024-02-11 NOTE — Telephone Encounter (Signed)
 I spoke with Cheryl (spouse) and let him know that the patient's hearing aids are ready to be picked up.  Dr Tiney  changed out the wax filters as they were full of wax.  There is a $30.00 charge for this service. Cheryl acknowledged and said he would come to pick them up tomorrow.

## 2024-02-11 NOTE — Telephone Encounter (Signed)
 Please call Sherri [name]  and talk to her or her son or caregiver and just confirm  --- she is not taking any of these medications regularly and does not take them at the same time.

## 2024-02-11 NOTE — Telephone Encounter (Signed)
 Copied from CRM [id number]. Topic: Clinical - Prescription Issue >> Feb 11, 2024 12:29 PM Rea ORN wrote: Reason for CRM: Ellouise with [employer] called in to report drug interaction with diazepam  (VALIUM ) 5 MG tablet,cyclobenzaprine  (FLEXERIL ) 10 MG tablet and oxyCODONE  (ROXICODONE ) 5 MG immediate release tablet . These medication contribute to cognitive decline, falls and confusion. They are asking PCP to reduce dose or switch to safer alternatives. >> Feb 11, 2024 12:38 PM Rea ORN wrote: Call back [phone]

## 2024-02-11 NOTE — Progress Notes (Signed)
  [address], [address] [place], [place] [address] [phone]  Hearing Aid Check     Sherri  [name] family member walked in to drop off the aids to checked. Patient is on bedrest and cannot come to the clinic.     Right Left   Hearing aid manufacturer Renaldo Kirschner P50-R SN: [id number] Phonak Audeo P50-R SN: [id number]  Hearing aid style Receiver-in-the-canal Receiver-in-the-canal  Hearing aid Technical brewer  Receiver M M  Dome/ custom earpiece Small double dome Small vented dome  Retention wire no no  Warranty expiration date 07-15-2022 07-15-2022  Initial fitting date 04-23-2020 04-23-2020  [facility]         Actions taken: Inspection of the devices and listening check showed that both wax filters were clogged. Cleaned both aids and changed domes and filters with hew ones.   Services fee: $0 was paid at checkout. There will be $30 due at pick up.     Recommend: Return for a hearing aid check , as needed. Return for a hearing evaluation and to see an ENT, if concerns with hearing changes arise.    Sherri [name], AUD

## 2024-02-12 ENCOUNTER — Ambulatory Visit (INDEPENDENT_AMBULATORY_CARE_PROVIDER_SITE_OTHER): Payer: Self-pay | Admitting: Audiology

## 2024-02-12 ENCOUNTER — Telehealth: Payer: Self-pay

## 2024-02-12 ENCOUNTER — Other Ambulatory Visit: Payer: Self-pay | Admitting: Internal Medicine

## 2024-02-12 DIAGNOSIS — H903 Sensorineural hearing loss, bilateral: Secondary | ICD-10-CM

## 2024-02-12 MED ORDER — POTASSIUM CHLORIDE CRYS ER 20 MEQ PO TBCR: 20.0000 meq | EXTENDED_RELEASE_TABLET | ORAL | 0 refills | Status: DC

## 2024-02-12 NOTE — Telephone Encounter (Signed)
This was sent to the pharmacy

## 2024-02-12 NOTE — Progress Notes (Signed)
  [address] [place], [place] [address] [phone]  Hearing Aid Check     Briya  A Stahlecker's family member comes for a scheduled appointment for a hearing aid check.      Right  Left  Hearing aid manufacturer Phonak Audeo P50-R SN: [id number] Phonak Audeo P50-R SN: [id number]  Hearing aid style Receiver-in-the-can[place] Receiver-in-the-[place]  Hearing aid Technical brewer  Receiver M M  Dome/ custom earpiece Small double dome Small vented dome  Retention wire no no  Warranty expiration date 07-15-2022 07-15-2022  Initial fitting date 04-23-2020 04-23-2020  Device was fit at: [facility]  [facility]        Actions taken: Patient's family member picked up her hearing aids as patient is on bed rest. Also the courtesy charger that had been offered several months ago was given to the family member today.   Services fee: $30 was paid at checkout.  Recommend: Return for a hearing aid check ,as needed. Return for a hearing evaluation and to see an ENT, if concerns with hearing changes arise.    Deanda Ruddell MARIE LEROUX-MARTINEZ, AUD

## 2024-02-12 NOTE — Telephone Encounter (Signed)
 Copied from CRM [id number]. Topic: Clinical - Medication Question >> Feb 12, 2024 10:00 AM Franky GRADE wrote: Reason for CRM: Patient's daughter is calling because they submitted a refill request through the pharmacy for diazepam  (VALIUM ) 5 MG tablet [[id number]], I did advise of the 3 day turn around time; however, she only has one pill left and would like to know if there is anyway to expedite the process.

## 2024-02-12 NOTE — Telephone Encounter (Signed)
 Copied from CRM #[id number]. Topic: Clinical - Lab/Test Results >> Feb 12, 2024  4:16 PM Charolett L wrote: Reason for CRM: Sherri [name] Patient potassium is down to 2.8  She stated that the information was already faxed to the office

## 2024-02-12 NOTE — Telephone Encounter (Signed)
 Noted recommendations.  She does take Flexeril  infrequently.  Oxycodone  is not new medication and she only takes it as needed related to recent surgery.  She does take the Valium  and tolerates it well and has been on it for years.  No changes needed.

## 2024-02-12 NOTE — Telephone Encounter (Signed)
 I am assuming this is recent blood work?  I did not order any blood work?  I have not received the fax yet.  Advised patient to start potassium 20 mill equivalents daily.  Needs blood work in 1 week to recheck-BMP low potassium.

## 2024-02-14 NOTE — Progress Notes (Unsigned)
 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   MRN: [id number]  HPI  Past Medical History:  Diagnosis Date   Anxiety    takes Valium  daily as needed   Basal cell carcinoma 01/21/1988   left nostril (MOHS), sup-right calf (CX35FU)   Basal cell carcinoma 03/22/1996   right calf (CX35FU)   Basal cell carcinoma 03/31/1995   left wing nose   Bruises easily    d/t meds   Cancer (HCC)    basal cell ca, in situ- uterine    Cataracts, bilateral    removed bilateral   Chronic back pain    Dizziness    r/t to meds   GERD (gastroesophageal reflux disease)    no meds on a regular basis but will take Tums if needed   Headache(784.0)    r/t neck issues   History of bronchitis 6-11yrs ago   History of colon polyps    benign   HOH (hard of hearing)    wears hearing aids   Hyperlipidemia    takes Atorvastatin  on Mondays and Fridays   Hypertension    Joint pain    Joint swelling    Multiple sclerosis (HCC)    Neuromuscular disorder (HCC)    Dr. FABIENE Crete- [facility], follows M.S.   Nocturia    PONV (postoperative nausea and vomiting)    trouble urinating after surgery in 2014   Postoperative nausea and vomiting 01/11/2019   Rheumatoid arthritis (HCC)    Dr Lorrayne weekly, RA- hands- knees- feet    Rheumatoid arthritis(714.0)    Dr Dolphus takes Xeljanz  daily   Right wrist fracture    Skin cancer    Squamous cell carcinoma of skin 11/02/2001   in situ-right knee (cx42fu)   Squamous cell carcinoma of skin 11/12/2011   in situ-left forearm (CX35FU), in situ-left foot (CX35FU)   Squamous cell carcinoma of skin 01/22/2012   in situ-left lower forearm (txpbx)   Squamous cell carcinoma of skin 11/17/2012   right shin (txpbx)   Squamous cell carcinoma of skin 10/28/2013   in situ-right shoulder (CX35FU)   Squamous cell carcinoma of skin  04/28/2014   well diff-right shin (txpbx)   Squamous cell carcinoma of skin 11/02/2014   in situ-Left shin (txpbx)   Squamous cell carcinoma of skin 12/15/2014   in situ-Left hand (txpbx), bowens-left side chest (txpbx)   Squamous cell carcinoma of skin 05/09/2015   in situ-left hand (txpbx)    Squamous cell carcinoma of skin 05/07/2016   in situ-left inner shin,ant, in situ-left inner shin, post, in situ-left outer forearm, in situ-right knuckle   Squamous cell carcinoma of skin 03/1302018   in situ-left shoulder (txpbx), in situ-right inner shin (txpbx), in situ-top of left foot (txpbx), KA- left forearm (txpbx)   Squamous cell carcinoma of skin 12/02/2016   in situ-left inner shin (txpbx)   Squamous cell carcinoma of skin 03/04/2017   in situ-left outer sup, shin (txpbx), in situ- right 2nd knuckle finger (txpbx), in situ- Left wrist (txpbx)   Squamous cell carcinoma of skin 04/06/2018   in situ-above left knee inner (txpbx)   Squamous cell carcinoma of skin 04/21/2018   in situ-right top hand (txpbx)   Squamous cell carcinoma of skin 07/08/2018   in situ-right lower inner shin (txpbx)   Squamous cell carcinoma of skin 04/27/2019   in  situ- Left neck(CX35FU), In situ- right neck (CX35FU)   Urinary retention    sees Dr.Wrenn about 2 times a yr    Past Surgical History:  Procedure Laterality Date   ABDOMINAL HYSTERECTOMY     1985   ANTERIOR CERVICAL DECOMP/DISCECTOMY FUSION N/A 04/23/2022   Procedure: Anterior Cervical Decompression/Discectomy Fusion - Cervical Seven-Thoracic One;  Surgeon: Louis Shove, MD;  Location: [facility];  Service: Neurosurgery;  Laterality: N/A;  3C   ANTERIOR HIP REVISION Right 01/21/2024   Procedure: REVISION, TOTAL ARTHROPLASTY, HIP, ANTERIOR APPROACH;  Surgeon: Fidel Rogue, MD;  Location: [facility];  Service: Orthopedics;  Laterality: Right;   APPENDECTOMY     with TAH   BACK SURGERY     several   BREAST BIOPSY Right    Results were negative   BROW  LIFT Bilateral 10/02/2016   Procedure: BILATERAL LOWER LID BLEPHAROPLASTY;  Surgeon: Laurie Loyd Redhead, MD;  Location: [facility];  Service: Plastics;  Laterality: Bilateral;   CARPAL TUNNEL RELEASE Right    cataracts     CERVICAL FUSION     Dr Leeann   CERVICAL FUSION  12/31/2012   Dr Leeann   CERVICAL FUSION  04/2022   CHEST TUBE INSERTION     for traumatic Pneumothorax   COLONOSCOPY  07/16/2010   normal    COLONOSCOPY W/ POLYPECTOMY  1997   negative since; Dr Debrah   ESOPHAGOGASTRODUODENOSCOPY  07/16/2010   normal   eye lid raise     EYE SURGERY Bilateral    cataracts removed - /w IOL   HARDWARE REMOVAL  08/2021   HIP CLOSED REDUCTION Right 11/16/2023   Procedure: CLOSED MANIPULATION, JOINT, HIP;  Surgeon: Ernie Cough, MD;  [facility]: [facility];  Service: Orthopedics;  Laterality: Right;   HIP CLOSED REDUCTION N/A 11/30/2023   Procedure: CLOSED REDUCTION, HIP;  Surgeon: Fidel Rogue, MD;  [facility];  Service: Orthopedics;  Laterality: N/A;   INCISION AND DRAINAGE OF WOUND Right 11/28/2023   Procedure: IRRIGATION AND DEBRIDEMENT WOUND; REMOVAL OF HARDWARE;  Surgeon: Edna Toribio LABOR, MD;  [facility]: [facility];  Service: Orthopedics;  Laterality: Right;  I & D RIGHT ELBOW   JOINT REPLACEMENT     LUMBAR FUSION     Dr Leeann   NASAL SINUS SURGERY     ORIF ELBOW FRACTURE Right 11/20/2023   Procedure: OPEN REDUCTION INTERNAL FIXATION (ORIF) ELBOW/OLECRANON FRACTURE;  Surgeon: [name] Franky SQUIBB, MD;  [facility];  Service: Orthopedics;  Laterality: Right;   POSTERIOR CERVICAL FUSION/FORAMINOTOMY N/A 12/31/2012   Procedure: POSTERIOR LATERAL CERVICAL FUSION/FORAMINOTOMY LEVEL 1 CERVICAL THREE-FOUR WITH LATERAL MASS SCREWS;  Surgeon: Catalina CHRISTELLA Leeann, MD;  [facility]: [facility];  Service: Neurosurgery;  Laterality: N/A;   PTOSIS REPAIR Bilateral 10/02/2016   Procedure: INTERNAL PTOSIS REPAIR;  Surgeon: Laurie Loyd Redhead, MD;  [facility]: [facility];  Service: Plastics;  Laterality: Bilateral;    SKIN BIOPSY     THYROID  SURGERY     R lobe removed- 1972, has grown back - CT last done- 2018   TOTAL HIP ARTHROPLASTY  12/22/2011   Procedure: TOTAL HIP ARTHROPLASTY;  Surgeon: Dempsey JINNY Sensor, MD;  [facility]: [facility];  Service: Orthopedics;  Laterality: Left;   TOTAL HIP ARTHROPLASTY Right 11/12/2023   Procedure: ARTHROPLASTY, HIP, TOTAL, ANTERIOR APPROACH;  Surgeon: Fidel Rogue, MD;  [facility];  Service: Orthopedics;  Laterality: Right;   TOTAL HIP REVISION Right 12/03/2023   Procedure: TOTAL HIP REVISION;  Surgeon: Fidel Rogue, MD;  Location:  [facility];  Service: Orthopedics;  Laterality: Right;  Right acetabular revision Anterior approach   TOTAL SHOULDER ARTHROPLASTY     TUBAL LIGATION     UPPER GASTROINTESTINAL ENDOSCOPY  2012   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   WRIST SURGERY Left 09/16/2022   wrist/arm    Family History  Problem Relation Age of Onset   Cancer Mother        pancreatic   Diabetes Mother    Pancreatic cancer Mother    Heart disease Father        Rheumatic   Cancer Father        ? stomach   Stomach cancer Father    Cancer Sister        stomach   Stomach cancer Sister    Cancer Maternal Aunt        X 4; ? primary   Heart disease Paternal Aunt    Cancer Maternal Grandmother        cervical   Colon cancer Other        Aunts   Multiple sclerosis Daughter    Hypertension Neg Hx    Stroke Neg Hx    Esophageal cancer Neg Hx    Rectal cancer Neg Hx       Social History   Socioeconomic History   Marital status: Married    Spouse name: Cheryl   Number of children: 2   Years of education: Not on file   Highest education level: Not on file  Occupational History   Occupation: retired  Tobacco Use   Smoking status: Former    Current packs/day: 0.00    Average packs/day: 2.5 packs/day for 20.0 years (50.0 ttl pk-yrs)    Types: Cigarettes    Start date: 05/26/1961    Quit date: 05/26/1981    Years since quitting: 42.7    Passive exposure:  Never   Smokeless tobacco: Never   Tobacco comments:    Smoked [phone], up to 2.5 ppd  Vaping Use   Vaping status: Never Used  Substance and Sexual Activity   Alcohol  use: Yes    Alcohol /week: 14.0 standard drinks of alcohol     Types: 14 Cans of beer per week    Comment: 2% beer   Drug use: Never   Sexual activity: Yes    Birth control/protection: Surgical    Comment: Hysterectomy  Other Topics Concern   Not on file  Social History Narrative   Not on file   Social Drivers of Health   Financial Resource Strain: Low Risk  (02/21/2022)   Overall Financial Resource Strain (CARDIA)    Difficulty of Paying Living Expenses: Not hard at all  Food Insecurity: No Food Insecurity (01/27/2024)   Hunger Vital Sign    Worried About Running Out of Food in the Last Year: Never true    Ran Out of Food in the Last Year: Never true  Transportation Needs: No Transportation Needs (01/27/2024)   PRAPARE - Administrator, Civil Service (Medical): No    Lack of Transportation (Non-Medical): No  Physical Activity: Inactive (02/21/2022)   Exercise Vital Sign    Days of Exercise per Week: 0 days    Minutes of Exercise per Session: 0 min  Stress: Stress Concern Present (02/21/2022)   [employer] of Occupational Health - Occupational Stress Questionnaire    Feeling of Stress : Rather much  Social Connections: Socially Integrated (01/19/2024)   Social Connection and Isolation Panel  Frequency of Communication with Friends and Family: Three times a week    Frequency of Social Gatherings with Friends and Family: Three times a week    Attends Religious Services: 1 to 4 times per year    Active Member of Clubs or Organizations: Yes    Attends Engineer, structural: More than 4 times per year    Marital Status: Married    Allergies  Allergen Reactions   Darvon [Propoxyphene Hcl] Shortness Of Breath    ? Dose Related ? Lowered respirations greatly   Demerol [Meperidine]  Shortness Of Breath and Other (See Comments)    Respiratory Distress   Penicillins Hives and Other (See Comments)    Has patient had a PCN reaction causing immediate rash, facial/tongue/throat swelling, SOB or lightheadedness with hypotension: No SEVERE RASH INVOLVING MUCUS MEMBRANES or SKIN NECROSIS: #  #  #  YES  #  #  #  Has patient had a PCN reaction that required hospitalization No Has patient had a PCN reaction occurring within the last 10 years: No.    Imuran [Azathioprine] Other (See Comments)    Weakness   Sulfa  Antibiotics Nausea Only and Other (See Comments)    States had severe indigestion and heartburn and nausea and had to stop taking   Arava [Leflunomide] Other (See Comments)    Excessive weight gain   Lipitor [Atorvastatin ] Nausea And Vomiting    Currently taking 2 times weekly - able to tolerate low dose    Vytorin [Ezetimibe-Simvastatin] Nausea And Vomiting     Current Outpatient Medications:    acetaminophen  (TYLENOL ) 325 MG tablet, Take 2 tablets (650 mg total) by mouth every 6 (six) hours as needed for mild pain (pain score 1-3) or fever (or Fever >/= 101)., Disp: 20 tablet, Rfl: 0   ascorbic acid  (VITAMIN C ) 1000 MG tablet, Take 1 tablet (1,000 mg total) by mouth daily., Disp: 100 tablet, Rfl: 0   atorvastatin  (LIPITOR) 10 MG tablet, Take 1 tablet (10 mg total) by mouth every evening., Disp: 30 tablet, Rfl: 0   Biotin  5000 MCG TABS, Take 10,000 mcg by mouth in the morning., Disp: , Rfl:    bisacodyl  (DULCOLAX) 10 MG suppository, Place 1 suppository (10 mg total) rectally daily as needed for moderate constipation., Disp: 12 suppository, Rfl: 0   Carboxymethylcellul-Glycerin  (LUBRICATING EYE DROPS OP), Place 1 drop into both eyes daily as needed (dry eyes)., Disp: , Rfl:    ceFEPime  (MAXIPIME ) IVPB, Inject 2 g into the vein every 12 (twelve) hours. Indication:  right hip PJI First Dose: No Last Day of Therapy:  03/07/24 Labs - Once weekly:  CBC/D and BMP, Labs - Once  weekly: ESR and CRP Method of administration: IV Push Method of administration may be changed at the discretion of home infusion pharmacist based upon assessment of the patient and/or caregiver's ability to self-administer the medication ordered., Disp: 83 Units, Rfl: 0   cholecalciferol  (VITAMIN D ) 25 MCG (1000 UNIT) tablet, Take 1,000 Units by mouth in the morning., Disp: , Rfl:    cyclobenzaprine  (FLEXERIL ) 10 MG tablet, TAKE 1 TABLET BY MOUTH THREE TIMES A DAY AS NEEDED FOR MUSCLE SPASM (Patient taking differently: Take 10 mg by mouth 3 (three) times daily as needed for muscle spasms.), Disp: 90 tablet, Rfl: 2   denosumab  (PROLIA ) 60 MG/ML SOSY injection, Inject 60 mg into the skin every 6 (six) months. Deliver to rheum: [address], [address], [place], [place] [address]. Appt on 07/23/2022, Disp: 1  mL, Rfl: 0   diazepam  (VALIUM ) 5 MG tablet, TAKE 1 TABLET BY MOUTH AT BEDTIME AS NEEDED FOR MUSCLE SPASMS., Disp: 30 tablet, Rfl: 5   docusate sodium  (COLACE) 100 MG capsule, Take 1 capsule (100 mg total) by mouth daily., Disp: 10 capsule, Rfl: 0   DULoxetine  (CYMBALTA ) 20 MG capsule, TAKE 1 CAPSULE BY MOUTH AT BEDTIME., Disp: 90 capsule, Rfl: 1   folic acid  (FOLVITE ) 1 MG tablet, Take 1 tablet (1 mg total) by mouth daily., Disp: 90 tablet, Rfl: 3   furosemide  (LASIX ) 20 MG tablet, Take 0.5 tablets (10 mg total) by mouth daily., Disp: 30 tablet, Rfl: 0   magic mouthwash (nystatin , lidocaine , diphenhydrAMINE , alum & mag hydroxide) suspension, Swish and spit 5 mLs 3 (three) times daily as needed for mouth pain., Disp: 180 mL, Rfl: 0   methotrexate  (RHEUMATREX) 2.5 MG tablet, Take 5 tablets (12.5 mg total) by mouth once a week. Caution:Chemotherapy. Protect from light., Disp: 60 tablet, Rfl: 0   Nystatin  (GERHARDT'S BUTT CREAM) CREA, Apply 1 Application topically 3 (three) times daily., Disp: 60 each, Rfl: 0   oxyCODONE  (ROXICODONE ) 5 MG immediate release tablet, Take 1 tablet (5 mg total) by mouth every 4  (four) hours as needed for severe pain (pain score 7-10)., Disp: 42 tablet, Rfl: 0   pantoprazole  (PROTONIX ) 40 MG tablet, Take 1 tablet (40 mg total) by mouth 2 (two) times daily before a meal., Disp: 60 tablet, Rfl: 5   polyethylene glycol powder (GLYCOLAX /MIRALAX ) 17 GM/SCOOP powder, Take 17 g by mouth 2 (two) times daily. (Patient taking differently: Take 17 g by mouth daily.), Disp: 238 g, Rfl: 0   potassium chloride  SA (KLOR-CON  M) 20 MEQ tablet, Take 1 tablet (20 mEq total) by mouth every other day., Disp: 30 tablet, Rfl: 0   predniSONE  (DELTASONE ) 5 MG tablet, TAKE 1 TABLET BY MOUTH EVERY DAY IN THE MORNING, Disp: 90 tablet, Rfl: 0   senna (SENOKOT) 8.6 MG tablet, Take 1 tablet by mouth at bedtime., Disp: , Rfl:    sodium chloride  1 g tablet, Take 1 tablet (1 g total) by mouth 2 (two) times daily with a meal., Disp: 60 tablet, Rfl: 2   tamsulosin  (FLOMAX ) 0.4 MG CAPS capsule, TAKE 1 CAPSULE BY MOUTH EVERY DAY AFTER SUPPER, Disp: 90 capsule, Rfl: 1   [Paused] Upadacitinib  ER ([name] ) 15 MG TB24, Take 15 mg by mouth daily., Disp: , Rfl:    Zinc  Sulfate 220 (50 Zn) MG TABS, Take 1 tablet (220 mg total) by mouth daily with supper., Disp: 100 tablet, Rfl: 0 No current facility-administered medications for this visit.  Facility-Administered Medications Ordered in Other Visits:    vancomycin  (VANCOCIN ) 1,000 mg in sodium chloride  0.9 % 500 mL IVPB, 1,000 mg, Intravenous, Once, Hill, [place]ery RAMAN, PA-C   Review of Systems     Objective:   Physical Exam        Assessment & Plan:

## 2024-02-15 ENCOUNTER — Ambulatory Visit (INDEPENDENT_AMBULATORY_CARE_PROVIDER_SITE_OTHER): Payer: Self-pay | Admitting: Infectious Disease

## 2024-02-15 ENCOUNTER — Telehealth: Payer: Self-pay

## 2024-02-15 ENCOUNTER — Encounter: Payer: Self-pay | Admitting: Infectious Disease

## 2024-02-15 ENCOUNTER — Other Ambulatory Visit: Payer: Self-pay

## 2024-02-15 VITALS — BP 94/60 | HR 113 | Temp 97.7°F | Resp 16

## 2024-02-15 DIAGNOSIS — D638 Anemia in other chronic diseases classified elsewhere: Secondary | ICD-10-CM

## 2024-02-15 DIAGNOSIS — T8450XD Infection and inflammatory reaction due to unspecified internal joint prosthesis, subsequent encounter: Secondary | ICD-10-CM

## 2024-02-15 DIAGNOSIS — A498 Other bacterial infections of unspecified site: Secondary | ICD-10-CM | POA: Diagnosis not present

## 2024-02-15 DIAGNOSIS — M0579 Rheumatoid arthritis with rheumatoid factor of multiple sites without organ or systems involvement: Secondary | ICD-10-CM

## 2024-02-15 DIAGNOSIS — G35 Multiple sclerosis: Secondary | ICD-10-CM

## 2024-02-15 DIAGNOSIS — M25551 Pain in right hip: Secondary | ICD-10-CM | POA: Diagnosis not present

## 2024-02-15 DIAGNOSIS — T8459XD Infection and inflammatory reaction due to other internal joint prosthesis, subsequent encounter: Secondary | ICD-10-CM | POA: Diagnosis not present

## 2024-02-15 DIAGNOSIS — Z96649 Presence of unspecified artificial hip joint: Secondary | ICD-10-CM

## 2024-02-15 DIAGNOSIS — R490 Dysphonia: Secondary | ICD-10-CM

## 2024-02-15 DIAGNOSIS — T8450XA Infection and inflammatory reaction due to unspecified internal joint prosthesis, initial encounter: Secondary | ICD-10-CM | POA: Insufficient documentation

## 2024-02-15 DIAGNOSIS — H9190 Unspecified hearing loss, unspecified ear: Secondary | ICD-10-CM | POA: Insufficient documentation

## 2024-02-15 DIAGNOSIS — E876 Hypokalemia: Secondary | ICD-10-CM

## 2024-02-15 MED ORDER — CIPROFLOXACIN HCL 500 MG PO TABS: 500.0000 mg | ORAL_TABLET | Freq: Two times a day (BID) | ORAL | 10 refills | Status: DC

## 2024-02-15 NOTE — Telephone Encounter (Signed)
 Spoke with Luke (daughter). They will pick up script today for Potassium.  Ellouise, nurse from [facility] was suppose to go out tomorrow for lab redrawl.  Called her today and left message @ [phone] asking if she could go out on Thurs/Friday of this week for BMP check (instead of tomorrow) so we can see if Potassium level has normalized or at least gone up.  I did ask her to call back and confirm she had received message as well.

## 2024-02-15 NOTE — Telephone Encounter (Signed)
 Copied from CRM #[id number]. Topic: Clinical - Lab/Test Results >> Feb 12, 2024  4:19 PM Aisha D wrote: Reason for CRM: Pt's daughter Luke stated that Ellouise the [facility] nurse stated that the pt's potassium and hemoglobin is low and should inform Dr.Burns. Luke is requesting that a nurse or Dr.Burns give her a callback today if possible. CB [phone].

## 2024-02-15 NOTE — Telephone Encounter (Signed)
 Per Dr.Van Dam stop IV antibiotics and pull PICC line on 03/07/2024. Message sent to [facility].    Lotoya Casella SHAUNNA Letters, CMA

## 2024-02-16 DIAGNOSIS — T84020A Dislocation of internal right hip prosthesis, initial encounter: Secondary | ICD-10-CM | POA: Diagnosis not present

## 2024-02-17 DIAGNOSIS — T84020D Dislocation of internal right hip prosthesis, subsequent encounter: Secondary | ICD-10-CM | POA: Diagnosis not present

## 2024-02-17 DIAGNOSIS — E222 Syndrome of inappropriate secretion of antidiuretic hormone: Secondary | ICD-10-CM | POA: Diagnosis not present

## 2024-02-17 DIAGNOSIS — K219 Gastro-esophageal reflux disease without esophagitis: Secondary | ICD-10-CM | POA: Diagnosis not present

## 2024-02-17 DIAGNOSIS — M4722 Other spondylosis with radiculopathy, cervical region: Secondary | ICD-10-CM | POA: Diagnosis not present

## 2024-02-17 DIAGNOSIS — Z87891 Personal history of nicotine dependence: Secondary | ICD-10-CM | POA: Diagnosis not present

## 2024-02-17 DIAGNOSIS — L89322 Pressure ulcer of left buttock, stage 2: Secondary | ICD-10-CM | POA: Diagnosis not present

## 2024-02-17 DIAGNOSIS — H9193 Unspecified hearing loss, bilateral: Secondary | ICD-10-CM | POA: Diagnosis not present

## 2024-02-17 DIAGNOSIS — Z85828 Personal history of other malignant neoplasm of skin: Secondary | ICD-10-CM | POA: Diagnosis not present

## 2024-02-17 DIAGNOSIS — M0689 Other specified rheumatoid arthritis, multiple sites: Secondary | ICD-10-CM | POA: Diagnosis not present

## 2024-02-17 DIAGNOSIS — E876 Hypokalemia: Secondary | ICD-10-CM | POA: Diagnosis not present

## 2024-02-17 DIAGNOSIS — R1310 Dysphagia, unspecified: Secondary | ICD-10-CM | POA: Diagnosis not present

## 2024-02-17 DIAGNOSIS — I10 Essential (primary) hypertension: Secondary | ICD-10-CM | POA: Diagnosis not present

## 2024-02-17 DIAGNOSIS — Z9181 History of falling: Secondary | ICD-10-CM | POA: Diagnosis not present

## 2024-02-17 DIAGNOSIS — R339 Retention of urine, unspecified: Secondary | ICD-10-CM | POA: Diagnosis not present

## 2024-02-17 DIAGNOSIS — E785 Hyperlipidemia, unspecified: Secondary | ICD-10-CM | POA: Diagnosis not present

## 2024-02-17 DIAGNOSIS — D62 Acute posthemorrhagic anemia: Secondary | ICD-10-CM | POA: Diagnosis not present

## 2024-02-17 DIAGNOSIS — G4700 Insomnia, unspecified: Secondary | ICD-10-CM | POA: Diagnosis not present

## 2024-02-17 DIAGNOSIS — G35D Multiple sclerosis, unspecified: Secondary | ICD-10-CM | POA: Diagnosis not present

## 2024-02-17 DIAGNOSIS — M4712 Other spondylosis with myelopathy, cervical region: Secondary | ICD-10-CM | POA: Diagnosis not present

## 2024-02-17 DIAGNOSIS — M80031D Age-related osteoporosis with current pathological fracture, right forearm, subsequent encounter for fracture with routine healing: Secondary | ICD-10-CM | POA: Diagnosis not present

## 2024-02-17 DIAGNOSIS — R338 Other retention of urine: Secondary | ICD-10-CM | POA: Diagnosis not present

## 2024-02-18 ENCOUNTER — Telehealth: Payer: Self-pay

## 2024-02-18 DIAGNOSIS — E222 Syndrome of inappropriate secretion of antidiuretic hormone: Secondary | ICD-10-CM | POA: Diagnosis not present

## 2024-02-18 DIAGNOSIS — Z9181 History of falling: Secondary | ICD-10-CM | POA: Diagnosis not present

## 2024-02-18 DIAGNOSIS — H9193 Unspecified hearing loss, bilateral: Secondary | ICD-10-CM | POA: Diagnosis not present

## 2024-02-18 DIAGNOSIS — R1310 Dysphagia, unspecified: Secondary | ICD-10-CM | POA: Diagnosis not present

## 2024-02-18 DIAGNOSIS — L89322 Pressure ulcer of left buttock, stage 2: Secondary | ICD-10-CM | POA: Diagnosis not present

## 2024-02-18 DIAGNOSIS — D62 Acute posthemorrhagic anemia: Secondary | ICD-10-CM | POA: Diagnosis not present

## 2024-02-18 DIAGNOSIS — Z85828 Personal history of other malignant neoplasm of skin: Secondary | ICD-10-CM | POA: Diagnosis not present

## 2024-02-18 DIAGNOSIS — M80031D Age-related osteoporosis with current pathological fracture, right forearm, subsequent encounter for fracture with routine healing: Secondary | ICD-10-CM | POA: Diagnosis not present

## 2024-02-18 DIAGNOSIS — A498 Other bacterial infections of unspecified site: Secondary | ICD-10-CM | POA: Diagnosis not present

## 2024-02-18 DIAGNOSIS — M0689 Other specified rheumatoid arthritis, multiple sites: Secondary | ICD-10-CM | POA: Diagnosis not present

## 2024-02-18 DIAGNOSIS — T8459XA Infection and inflammatory reaction due to other internal joint prosthesis, initial encounter: Secondary | ICD-10-CM | POA: Diagnosis not present

## 2024-02-18 DIAGNOSIS — E876 Hypokalemia: Secondary | ICD-10-CM | POA: Diagnosis not present

## 2024-02-18 DIAGNOSIS — K219 Gastro-esophageal reflux disease without esophagitis: Secondary | ICD-10-CM | POA: Diagnosis not present

## 2024-02-18 DIAGNOSIS — M4712 Other spondylosis with myelopathy, cervical region: Secondary | ICD-10-CM | POA: Diagnosis not present

## 2024-02-18 DIAGNOSIS — Z87891 Personal history of nicotine dependence: Secondary | ICD-10-CM | POA: Diagnosis not present

## 2024-02-18 DIAGNOSIS — I10 Essential (primary) hypertension: Secondary | ICD-10-CM | POA: Diagnosis not present

## 2024-02-18 DIAGNOSIS — E785 Hyperlipidemia, unspecified: Secondary | ICD-10-CM | POA: Diagnosis not present

## 2024-02-18 DIAGNOSIS — G35D Multiple sclerosis, unspecified: Secondary | ICD-10-CM | POA: Diagnosis not present

## 2024-02-18 DIAGNOSIS — M4722 Other spondylosis with radiculopathy, cervical region: Secondary | ICD-10-CM | POA: Diagnosis not present

## 2024-02-18 DIAGNOSIS — R339 Retention of urine, unspecified: Secondary | ICD-10-CM | POA: Diagnosis not present

## 2024-02-18 DIAGNOSIS — T84020D Dislocation of internal right hip prosthesis, subsequent encounter: Secondary | ICD-10-CM | POA: Diagnosis not present

## 2024-02-18 DIAGNOSIS — Z96649 Presence of unspecified artificial hip joint: Secondary | ICD-10-CM | POA: Diagnosis not present

## 2024-02-18 DIAGNOSIS — G4700 Insomnia, unspecified: Secondary | ICD-10-CM | POA: Diagnosis not present

## 2024-02-18 NOTE — Telephone Encounter (Signed)
 Per Dr. Fleeta Rothman okay to setup cathflo at [facility] infusion center. Secure chat forwarded to staff to setup appt.  Lorenda CHRISTELLA Code, RMA

## 2024-02-18 NOTE — Telephone Encounter (Signed)
 Received message from [facility] regarding HH concerns for picc line.  Hey team RN reaching out stating unable to flush or draw blood from PICC.  Non coverage by insurance for cath flo. Please advise if able to set up with [employer]  or other.  Thank you.   Pam   Will forward to provider and pharmacy team. Lorenda CHRISTELLA Code, RMA

## 2024-02-19 ENCOUNTER — Other Ambulatory Visit (HOSPITAL_COMMUNITY): Payer: Self-pay

## 2024-02-19 NOTE — Telephone Encounter (Signed)
 Staff with [facility] attempted to setup appt today, but pt is not able to accept visit due to transportation. Will try scheduling appt early next week and have family coordinate transportation. Lorenda CHRISTELLA Code, RMA

## 2024-02-19 NOTE — Telephone Encounter (Signed)
 Orders faxed and received by Short stay staff.  Lorenda CHRISTELLA Code, RMA

## 2024-02-19 NOTE — Telephone Encounter (Signed)
 Scheduled for Monday at 10. Will need to check in 2nd floor at [facility]. Relayed appt information to pt daughter.  Lorenda CHRISTELLA Code, RMA

## 2024-02-22 ENCOUNTER — Ambulatory Visit (HOSPITAL_COMMUNITY)
Admission: RE | Admit: 2024-02-22 | Discharge: 2024-02-22 | Disposition: A | Discharge status: Home / Self Care | Source: Ambulatory Visit | Attending: Infectious Disease | Admitting: Infectious Disease

## 2024-02-22 DIAGNOSIS — M80031D Age-related osteoporosis with current pathological fracture, right forearm, subsequent encounter for fracture with routine healing: Secondary | ICD-10-CM | POA: Diagnosis not present

## 2024-02-22 DIAGNOSIS — R339 Retention of urine, unspecified: Secondary | ICD-10-CM | POA: Diagnosis not present

## 2024-02-22 DIAGNOSIS — D62 Acute posthemorrhagic anemia: Secondary | ICD-10-CM | POA: Diagnosis not present

## 2024-02-22 DIAGNOSIS — Z85828 Personal history of other malignant neoplasm of skin: Secondary | ICD-10-CM | POA: Diagnosis not present

## 2024-02-22 DIAGNOSIS — I10 Essential (primary) hypertension: Secondary | ICD-10-CM | POA: Diagnosis not present

## 2024-02-22 DIAGNOSIS — E222 Syndrome of inappropriate secretion of antidiuretic hormone: Secondary | ICD-10-CM | POA: Diagnosis not present

## 2024-02-22 DIAGNOSIS — Z9181 History of falling: Secondary | ICD-10-CM | POA: Diagnosis not present

## 2024-02-22 DIAGNOSIS — R1310 Dysphagia, unspecified: Secondary | ICD-10-CM | POA: Diagnosis not present

## 2024-02-22 DIAGNOSIS — G35D Multiple sclerosis, unspecified: Secondary | ICD-10-CM | POA: Diagnosis not present

## 2024-02-22 DIAGNOSIS — M0689 Other specified rheumatoid arthritis, multiple sites: Secondary | ICD-10-CM | POA: Diagnosis not present

## 2024-02-22 DIAGNOSIS — M4712 Other spondylosis with myelopathy, cervical region: Secondary | ICD-10-CM | POA: Diagnosis not present

## 2024-02-22 DIAGNOSIS — H9193 Unspecified hearing loss, bilateral: Secondary | ICD-10-CM | POA: Diagnosis not present

## 2024-02-22 DIAGNOSIS — T84020D Dislocation of internal right hip prosthesis, subsequent encounter: Secondary | ICD-10-CM | POA: Diagnosis not present

## 2024-02-22 DIAGNOSIS — T8451XA Infection and inflammatory reaction due to internal right hip prosthesis, initial encounter: Secondary | ICD-10-CM | POA: Diagnosis not present

## 2024-02-22 DIAGNOSIS — B965 Pseudomonas (aeruginosa) (mallei) (pseudomallei) as the cause of diseases classified elsewhere: Secondary | ICD-10-CM | POA: Diagnosis not present

## 2024-02-22 DIAGNOSIS — E876 Hypokalemia: Secondary | ICD-10-CM | POA: Diagnosis not present

## 2024-02-22 DIAGNOSIS — G4700 Insomnia, unspecified: Secondary | ICD-10-CM | POA: Diagnosis not present

## 2024-02-22 DIAGNOSIS — E785 Hyperlipidemia, unspecified: Secondary | ICD-10-CM | POA: Diagnosis not present

## 2024-02-22 DIAGNOSIS — M4722 Other spondylosis with radiculopathy, cervical region: Secondary | ICD-10-CM | POA: Diagnosis not present

## 2024-02-22 DIAGNOSIS — Z87891 Personal history of nicotine dependence: Secondary | ICD-10-CM | POA: Diagnosis not present

## 2024-02-22 DIAGNOSIS — L89322 Pressure ulcer of left buttock, stage 2: Secondary | ICD-10-CM | POA: Diagnosis not present

## 2024-02-22 DIAGNOSIS — K219 Gastro-esophageal reflux disease without esophagitis: Secondary | ICD-10-CM | POA: Diagnosis not present

## 2024-02-22 MED ORDER — ALTEPLASE 2 MG IJ SOLR
INTRAMUSCULAR | Status: AC
  Filled 2024-02-22: qty 2

## 2024-02-22 MED ORDER — ALTEPLASE 2 MG IJ SOLR: 2.0000 mg | Freq: Once | INTRAMUSCULAR | Status: DC

## 2024-02-25 DIAGNOSIS — Z96649 Presence of unspecified artificial hip joint: Secondary | ICD-10-CM | POA: Diagnosis not present

## 2024-02-25 DIAGNOSIS — E785 Hyperlipidemia, unspecified: Secondary | ICD-10-CM | POA: Diagnosis not present

## 2024-02-25 DIAGNOSIS — I10 Essential (primary) hypertension: Secondary | ICD-10-CM | POA: Diagnosis not present

## 2024-02-25 DIAGNOSIS — G35D Multiple sclerosis, unspecified: Secondary | ICD-10-CM | POA: Diagnosis not present

## 2024-02-25 DIAGNOSIS — Z9181 History of falling: Secondary | ICD-10-CM | POA: Diagnosis not present

## 2024-02-25 DIAGNOSIS — Z85828 Personal history of other malignant neoplasm of skin: Secondary | ICD-10-CM | POA: Diagnosis not present

## 2024-02-25 DIAGNOSIS — H9193 Unspecified hearing loss, bilateral: Secondary | ICD-10-CM | POA: Diagnosis not present

## 2024-02-25 DIAGNOSIS — M4722 Other spondylosis with radiculopathy, cervical region: Secondary | ICD-10-CM | POA: Diagnosis not present

## 2024-02-25 DIAGNOSIS — M0689 Other specified rheumatoid arthritis, multiple sites: Secondary | ICD-10-CM | POA: Diagnosis not present

## 2024-02-25 DIAGNOSIS — G4700 Insomnia, unspecified: Secondary | ICD-10-CM | POA: Diagnosis not present

## 2024-02-25 DIAGNOSIS — M4712 Other spondylosis with myelopathy, cervical region: Secondary | ICD-10-CM | POA: Diagnosis not present

## 2024-02-25 DIAGNOSIS — M80031D Age-related osteoporosis with current pathological fracture, right forearm, subsequent encounter for fracture with routine healing: Secondary | ICD-10-CM | POA: Diagnosis not present

## 2024-02-25 DIAGNOSIS — T84020D Dislocation of internal right hip prosthesis, subsequent encounter: Secondary | ICD-10-CM | POA: Diagnosis not present

## 2024-02-25 DIAGNOSIS — Z87891 Personal history of nicotine dependence: Secondary | ICD-10-CM | POA: Diagnosis not present

## 2024-02-25 DIAGNOSIS — L89322 Pressure ulcer of left buttock, stage 2: Secondary | ICD-10-CM | POA: Diagnosis not present

## 2024-02-25 DIAGNOSIS — E222 Syndrome of inappropriate secretion of antidiuretic hormone: Secondary | ICD-10-CM | POA: Diagnosis not present

## 2024-02-25 DIAGNOSIS — D62 Acute posthemorrhagic anemia: Secondary | ICD-10-CM | POA: Diagnosis not present

## 2024-02-25 DIAGNOSIS — A498 Other bacterial infections of unspecified site: Secondary | ICD-10-CM | POA: Diagnosis not present

## 2024-02-25 DIAGNOSIS — T8459XA Infection and inflammatory reaction due to other internal joint prosthesis, initial encounter: Secondary | ICD-10-CM | POA: Diagnosis not present

## 2024-02-25 DIAGNOSIS — K219 Gastro-esophageal reflux disease without esophagitis: Secondary | ICD-10-CM | POA: Diagnosis not present

## 2024-02-25 DIAGNOSIS — E876 Hypokalemia: Secondary | ICD-10-CM | POA: Diagnosis not present

## 2024-02-25 DIAGNOSIS — R339 Retention of urine, unspecified: Secondary | ICD-10-CM | POA: Diagnosis not present

## 2024-02-25 DIAGNOSIS — R1310 Dysphagia, unspecified: Secondary | ICD-10-CM | POA: Diagnosis not present

## 2024-02-26 ENCOUNTER — Telehealth: Payer: Self-pay

## 2024-02-26 ENCOUNTER — Other Ambulatory Visit: Payer: Self-pay

## 2024-02-26 ENCOUNTER — Ambulatory Visit (INDEPENDENT_AMBULATORY_CARE_PROVIDER_SITE_OTHER): Admitting: Audiology

## 2024-02-26 DIAGNOSIS — T84020D Dislocation of internal right hip prosthesis, subsequent encounter: Secondary | ICD-10-CM | POA: Diagnosis not present

## 2024-02-26 DIAGNOSIS — D62 Acute posthemorrhagic anemia: Secondary | ICD-10-CM | POA: Diagnosis not present

## 2024-02-26 DIAGNOSIS — M0689 Other specified rheumatoid arthritis, multiple sites: Secondary | ICD-10-CM | POA: Diagnosis not present

## 2024-02-26 DIAGNOSIS — L89322 Pressure ulcer of left buttock, stage 2: Secondary | ICD-10-CM | POA: Diagnosis not present

## 2024-02-26 DIAGNOSIS — M4712 Other spondylosis with myelopathy, cervical region: Secondary | ICD-10-CM | POA: Diagnosis not present

## 2024-02-26 DIAGNOSIS — Z85828 Personal history of other malignant neoplasm of skin: Secondary | ICD-10-CM | POA: Diagnosis not present

## 2024-02-26 DIAGNOSIS — M80031D Age-related osteoporosis with current pathological fracture, right forearm, subsequent encounter for fracture with routine healing: Secondary | ICD-10-CM | POA: Diagnosis not present

## 2024-02-26 DIAGNOSIS — G4700 Insomnia, unspecified: Secondary | ICD-10-CM | POA: Diagnosis not present

## 2024-02-26 DIAGNOSIS — R339 Retention of urine, unspecified: Secondary | ICD-10-CM | POA: Diagnosis not present

## 2024-02-26 DIAGNOSIS — R1310 Dysphagia, unspecified: Secondary | ICD-10-CM | POA: Diagnosis not present

## 2024-02-26 DIAGNOSIS — Z9181 History of falling: Secondary | ICD-10-CM | POA: Diagnosis not present

## 2024-02-26 DIAGNOSIS — E876 Hypokalemia: Secondary | ICD-10-CM | POA: Diagnosis not present

## 2024-02-26 DIAGNOSIS — M4722 Other spondylosis with radiculopathy, cervical region: Secondary | ICD-10-CM | POA: Diagnosis not present

## 2024-02-26 DIAGNOSIS — Z87891 Personal history of nicotine dependence: Secondary | ICD-10-CM | POA: Diagnosis not present

## 2024-02-26 DIAGNOSIS — H9193 Unspecified hearing loss, bilateral: Secondary | ICD-10-CM | POA: Diagnosis not present

## 2024-02-26 DIAGNOSIS — H903 Sensorineural hearing loss, bilateral: Secondary | ICD-10-CM

## 2024-02-26 DIAGNOSIS — K219 Gastro-esophageal reflux disease without esophagitis: Secondary | ICD-10-CM | POA: Diagnosis not present

## 2024-02-26 DIAGNOSIS — I10 Essential (primary) hypertension: Secondary | ICD-10-CM | POA: Diagnosis not present

## 2024-02-26 DIAGNOSIS — E222 Syndrome of inappropriate secretion of antidiuretic hormone: Secondary | ICD-10-CM | POA: Diagnosis not present

## 2024-02-26 DIAGNOSIS — E785 Hyperlipidemia, unspecified: Secondary | ICD-10-CM | POA: Diagnosis not present

## 2024-02-26 DIAGNOSIS — G35D Multiple sclerosis, unspecified: Secondary | ICD-10-CM | POA: Diagnosis not present

## 2024-02-26 MED ORDER — CIPROFLOXACIN HCL 500 MG PO TABS: 500.0000 mg | ORAL_TABLET | Freq: Two times a day (BID) | ORAL | 10 refills | Status: DC

## 2024-02-26 NOTE — Telephone Encounter (Signed)
 Tina,RN with [facility] called stating that patient pulled out her picc line. patient also c/o itching x 3 days. RN stated that she noticed patient chest and face was red after getting infusion earlier this week. EOT is 10/13    Per Dr.Van Dam- If she has no picc line we can switch her to ciprofloxacin  500 mg Bid, give her 60 tablets w 10 refills.   RX sent to pharmacy.  Informed RN who is with the patient she will relay message to her.

## 2024-02-26 NOTE — Progress Notes (Signed)
  [address] [place], [place] [address] [phone]  Hearing Aid Check     Sherri  [name] husband walked in to have the dome on for one of her aids placed on the receiver. Reportedly Ms. Meter continues to be on bed rest.          Right  Left  Hearing aid manufacturer Phonak Audeo P50-R SN: [id number] Phonak Audeo P50-R SN: [id number]  Hearing aid style Receiver-in-the-canal Receiver-in-the-[place]al  Hearing aid Technical brewer  Receiver M M  Dome/ custom earpiece Small double dome Small vented dome  Retention wire no no  Warranty expiration date 07-15-2022 07-15-2022  Initial fitting date 04-23-2020 04-23-2020  [facility]     Attached the dome to the aid as a courtesy. No charges today. Her husband picked up her aids and paid fees recently after a repair.  Recommend: Return for a hearing aid check , as needed. Return for a hearing evaluation and to see an ENT, if concerns with hearing changes arise.    Sherri [name], AUD

## 2024-03-01 ENCOUNTER — Other Ambulatory Visit: Payer: Self-pay | Admitting: Physician Assistant

## 2024-03-01 ENCOUNTER — Telehealth: Payer: Self-pay

## 2024-03-01 NOTE — Telephone Encounter (Signed)
 Copied from CRM #[id number]. Topic: Clinical - Medication Question >> Mar 01, 2024  3:02 PM Shereese L wrote: Reason for CRM: patient daughter is calling to verify if patient needs to take the medication listed below because she going to the bathroom 14-15 times a day. She is requesting a call back @ [phone] furosemide  (LASIX ) 20 MG tablet tamsulosin  (FLOMAX ) 0.4 MG CAPS capsule

## 2024-03-01 NOTE — Telephone Encounter (Signed)
 Last Fill: 09/02/2023  Labs: 02/22/2024 CO2 17, CBC WNL  Next Visit: Due August 2025. Message sent to the front to schedule.   Last Visit: 08/06/2023   DX: Rheumatoid arthritis involving multiple sites with positive rheumatoid factor   Current Dose per office note 08/06/2023:  Methotrexate  5 tablets weekly,   Okay to refill Methotrexate ?

## 2024-03-01 NOTE — Telephone Encounter (Signed)
 Please schedule patient a follow up visit. Patient due August 2025. Thanks!   Follow-Up Instructions: Return in about 5 months (around 01/06/2024) for Rheumatoid arthritis, Osteoarthritis, Osteoporosis.

## 2024-03-03 NOTE — Telephone Encounter (Signed)
 The tamsulosin  or Flomax  restarted by urology I believe.  If they do not think she needs it any further then it can be stopped.  The furosemide  10 mg daily was for the leg edema.  We may be able to stop it if her left leg swelling is better or if it is not making a difference.  Okay to hold it for a few days and see how she does without it.  It can be taken as needed.

## 2024-03-03 NOTE — Telephone Encounter (Signed)
 Message left for daughter today with details.

## 2024-03-04 ENCOUNTER — Telehealth: Payer: Self-pay

## 2024-03-04 NOTE — Telephone Encounter (Signed)
 Copied from CRM [id number]. 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 Please advise.

## 2024-03-07 ENCOUNTER — Other Ambulatory Visit: Payer: Self-pay | Admitting: Internal Medicine

## 2024-03-07 ENCOUNTER — Telehealth: Payer: Self-pay

## 2024-03-07 NOTE — Telephone Encounter (Signed)
 Copied from CRM [id number]. Topic: Clinical - Home Health Verbal Orders >> Mar 07, 2024  2:20 PM Tinnie BROCKS wrote: Caller/Agency: [facility] Callback Number: [phone] Service Requested: Skilled Nursing, wound care Frequency: Once every 2 days, nurse will go out once per week.  Any new concerns about the patient? Yes, multiple new tears on lower extremeties, wants to take bandaids off and use Xeroform, cover with a nonstick band, and wrap with roll balls.

## 2024-03-08 ENCOUNTER — Encounter: Payer: Self-pay | Admitting: Internal Medicine

## 2024-03-08 NOTE — Telephone Encounter (Signed)
 Okay for orders -agree with plan  Glade JINNY Hope, MD

## 2024-03-09 ENCOUNTER — Ambulatory Visit: Payer: Self-pay

## 2024-03-09 NOTE — Telephone Encounter (Signed)
 FYI Only or Action Required?: FYI only for provider.  Patient was last seen in primary care on 02/03/2024 by [name] PARAS, MD.  Called Nurse Triage reporting Rash.  Symptoms began Monday.  Interventions attempted: Nothing.  Symptoms are: gradually worsening.  Triage Disposition: See Physician Within 24 Hours, See PCP When Office is Open (Within 3 Days)  Patient/caregiver understands and will follow disposition?: Yes    Copied from CRM #[id number]. Topic: Clinical - Red Word Triage >> Mar 09, 2024  9:10 AM Avram MATSU wrote: Red Word that prompted transfer to Nurse Triage: pt has shingles, pain,itchy. Reason for Disposition  [1] MODERATE pain (e.g., interferes with normal activities) AND [2] present > 3 days  [1] Looks infected (e.g., spreading redness, pus) AND [2] no fever  Answer Assessment - Initial Assessment Questions 1. CALLER DIAGNOSIS: What do you think is causing the rash? (e.g., athlete's foot, chickenpox, hives, impetigo)     Pt thinks shingles 2. LOCALIZED OR WIDESPREAD:  Is the rash all over (widespread) or mostly just in one area of the body (localized)?      localized 3. NEW MEDICINES: Are you taking any new medicine?     Yes due to several surgeries 4. APPEARANCE of RASH: What does the rash look like? What color is it?  Note: It is difficult to assess rash color in people with darker-colored skin. When this situation occurs, simply ask the caller to describe what they see.     Red, raised and now blisters, itching no pain 5. FEVER: Do you have a fever? If Yes, ask: What is your temperature, how was it measured, and when did it start?     no 6. PREGNANCY: Is there any chance you are pregnant? When was your last menstrual period?     na  Rash started on left foot and spread left leg and right foot and right leg.  Now also on right stomach area. Rash started Monday  Answer Assessment - Initial Assessment Questions 1. ONSET: When did the muscle aches  or body pains start?      X 3 weeks 2. LOCATION: What part of your body is hurting? (e.g., entire body, arms, legs)      Tailbone area has pain 3. SEVERITY: How bad is the pain? (Scale 1-10; or mild, moderate, severe)     Moderate to severe 4. CAUSE: What do you think is causing the pains?     unsure 5. FEVER: Do you have a fever? If Yes, ask: What is your temperature, how was it measured, and  when did it start?      no 6. OTHER SYMPTOMS: Do you have any other symptoms? (e.g., chest pain, cold or flu symptoms, rash, weakness, weight loss)     Red,  7. PREGNANCY: Is there any chance you are pregnant? When was your last menstrual period?     na 8. TRAVEL: Have you traveled out of the country in the last month? (e.g., exposures, travel history)     na  A lot of pain around tailbone and itchy - 2 bed sores and or back brace possible causing irritation per patient  Answer Assessment - Initial Assessment Questions 1. APPEARANCE of RASH: What does the rash look like? (e.g., blisters, dry flaky skin, red spots, redness, sores)     Red, raised with blisters forming 2. LOCATION: Where is the rash located?      Right and left foot and legs and stomach  3. NUMBER: How  many spots are there?      na 4. SIZE: How big are the spots? (e.g., inches, cm; or compare to size of pinhead, tip of pen, eraser, pea)      na 5. ONSET: When did the rash start?      Monday 6. ITCHING: Does the rash itch? If Yes, ask: How bad is the itch?  (Scale 0-10; or none, mild, moderate, severe)     Moderate to severe 7. PAIN: Does the rash hurt? If Yes, ask: How bad is the pain?  (Scale 0-10; or none, mild, moderate, severe)     no 8. OTHER SYMPTOMS: Do you have any other symptoms? (e.g., fever)     no 9. PREGNANCY: Is there any chance you are pregnant? When was your last menstrual period?     na  Protocols used: Rash - Guideline Selection-A-AH, Muscle Aches and Body  Pain-A-AH, Rash or Redness - Localized-A-AH

## 2024-03-09 NOTE — Telephone Encounter (Signed)
 Pt is being seen by [employer] tomorrow

## 2024-03-10 ENCOUNTER — Telehealth (INDEPENDENT_AMBULATORY_CARE_PROVIDER_SITE_OTHER): Admitting: Family Medicine

## 2024-03-10 ENCOUNTER — Encounter: Payer: Self-pay | Admitting: Family Medicine

## 2024-03-10 ENCOUNTER — Telehealth: Payer: Self-pay

## 2024-03-10 DIAGNOSIS — R234 Changes in skin texture: Secondary | ICD-10-CM

## 2024-03-10 DIAGNOSIS — R21 Rash and other nonspecific skin eruption: Secondary | ICD-10-CM | POA: Diagnosis not present

## 2024-03-10 DIAGNOSIS — T50905A Adverse effect of unspecified drugs, medicaments and biological substances, initial encounter: Secondary | ICD-10-CM | POA: Diagnosis not present

## 2024-03-10 NOTE — Telephone Encounter (Signed)
 Spoke with spouse today and informed him we can no longer do direct admits any longer.

## 2024-03-10 NOTE — Telephone Encounter (Signed)
 Called and left message for Sherri [name] with verbals today.

## 2024-03-10 NOTE — Telephone Encounter (Signed)
 Copied from CRM [id number]. Topic: General - Other >> Mar 10, 2024 11:56 AM Pinkey ORN wrote: Reason for CRM: Attention [name] PARAS, MD >> Mar 10, 2024 11:59 AM Pinkey ORN wrote: Marget Fell caregiver [phone]  Is asking to have a request for hospital admission submitted for tomorrow morning, so that patient's husband can take her himself instead of trying to use transport services. Please follow up family at [phone]

## 2024-03-10 NOTE — Progress Notes (Signed)
 Virtual Visit via Video Note  I connected with Sherri [name] on 03/10/24 at 10:40 AM EDT by a video enabled telemedicine application and verified that I am speaking with the correct person using two identifiers.  Patient Location: Home Provider Location: Office/Clinic  I discussed the limitations, risks, security, and privacy concerns of performing an evaluation and management service by video and the availability of in person appointments. I also discussed with the patient that there may be a patient responsible charge related to this service. The patient expressed understanding and agreed to proceed.  Subjective: PCP: [name] PARAS, MD  No chief complaint on file.  HPI  83 year old female presents for A/V visit for evaluation of rash to both feet and bottom.  Presents with husband and caregiver today. Patient identified with 2 identifiers. Reports that the rash is very itchy, caregiver reports that the area is blistered and the skin is peeling. Patient was on IV antibiotics with a PICC until 2 weeks ago.  Has been on Cipro  for the last 2 weeks orally 500 mg twice daily. Denies fever, chills, diarrhea, other concerns.  ROS: Per HPI  Current Outpatient Medications:    acetaminophen  (TYLENOL ) 325 MG tablet, Take 2 tablets (650 mg total) by mouth every 6 (six) hours as needed for mild pain (pain score 1-3) or fever (or Fever >/= 101)., Disp: 20 tablet, Rfl: 0   ascorbic acid  (VITAMIN C ) 1000 MG tablet, Take 1 tablet (1,000 mg total) by mouth daily., Disp: 100 tablet, Rfl: 0   atorvastatin  (LIPITOR) 10 MG tablet, Take 1 tablet (10 mg total) by mouth every evening., Disp: 30 tablet, Rfl: 0   Biotin  5000 MCG TABS, Take 10,000 mcg by mouth in the morning., Disp: , Rfl:    bisacodyl  (DULCOLAX) 10 MG suppository, Place 1 suppository (10 mg total) rectally daily as needed for moderate constipation., Disp: 12 suppository, Rfl: 0   Carboxymethylcellul-Glycerin  (LUBRICATING EYE DROPS OP),  Place 1 drop into both eyes daily as needed (dry eyes)., Disp: , Rfl:    cholecalciferol  (VITAMIN D ) 25 MCG (1000 UNIT) tablet, Take 1,000 Units by mouth in the morning., Disp: , Rfl:    ciprofloxacin  (CIPRO ) 500 MG tablet, Take 1 tablet (500 mg total) by mouth 2 (two) times daily., Disp: 60 tablet, Rfl: 10   cyclobenzaprine  (FLEXERIL ) 10 MG tablet, TAKE 1 TABLET BY MOUTH THREE TIMES A DAY AS NEEDED FOR MUSCLE SPASM (Patient taking differently: Take 10 mg by mouth 3 (three) times daily as needed for muscle spasms.), Disp: 90 tablet, Rfl: 2   denosumab  (PROLIA ) 60 MG/ML SOSY injection, Inject 60 mg into the skin every 6 (six) months. Deliver to rheum: [address], [address], [place], [place] [address]. Appt on 07/23/2022, Disp: 1 mL, Rfl: 0   diazepam  (VALIUM ) 5 MG tablet, TAKE 1 TABLET BY MOUTH AT BEDTIME AS NEEDED FOR MUSCLE SPASMS., Disp: 30 tablet, Rfl: 5   docusate sodium  (COLACE) 100 MG capsule, Take 1 capsule (100 mg total) by mouth daily., Disp: 10 capsule, Rfl: 0   DULoxetine  (CYMBALTA ) 20 MG capsule, TAKE 1 CAPSULE BY MOUTH AT BEDTIME., Disp: 90 capsule, Rfl: 1   folic acid  (FOLVITE ) 1 MG tablet, Take 1 tablet (1 mg total) by mouth daily., Disp: 90 tablet, Rfl: 3   furosemide  (LASIX ) 20 MG tablet, Take 0.5 tablets (10 mg total) by mouth daily., Disp: 30 tablet, Rfl: 0   magic mouthwash (nystatin , lidocaine , diphenhydrAMINE , alum & mag hydroxide) suspension, Swish and spit 5  mLs 3 (three) times daily as needed for mouth pain., Disp: 180 mL, Rfl: 0   methotrexate  (RHEUMATREX) 2.5 MG tablet, TAKE 5 TABLETS (12.5 MG TOTAL) BY MOUTH ONCE A WEEK. CAUTION:CHEMOTHERAPY. PROTECT FROM LIGHT., Disp: 60 tablet, Rfl: 0   Nystatin  (GERHARDT'S BUTT CREAM) CREA, Apply 1 Application topically 3 (three) times daily., Disp: 60 each, Rfl: 0   oxyCODONE  (ROXICODONE ) 5 MG immediate release tablet, Take 1 tablet (5 mg total) by mouth every 4 (four) hours as needed for severe pain (pain score 7-10)., Disp: 42 tablet,  Rfl: 0   pantoprazole  (PROTONIX ) 40 MG tablet, Take 1 tablet (40 mg total) by mouth 2 (two) times daily before a meal., Disp: 60 tablet, Rfl: 5   polyethylene glycol powder (GLYCOLAX /MIRALAX ) 17 GM/SCOOP powder, Take 17 g by mouth 2 (two) times daily. (Patient taking differently: Take 17 g by mouth daily.), Disp: 238 g, Rfl: 0   potassium chloride  SA (KLOR-CON  M) 20 MEQ tablet, TAKE 1 TABLET (20 MEQ TOTAL) BY MOUTH EVERY OTHER DAY., Disp: 90 tablet, Rfl: 1   predniSONE  (DELTASONE ) 5 MG tablet, TAKE 1 TABLET BY MOUTH EVERY DAY IN THE MORNING, Disp: 90 tablet, Rfl: 0   senna (SENOKOT) 8.6 MG tablet, Take 1 tablet by mouth at bedtime., Disp: , Rfl:    sodium chloride  1 g tablet, Take 1 tablet (1 g total) by mouth 2 (two) times daily with a meal., Disp: 60 tablet, Rfl: 2   tamsulosin  (FLOMAX ) 0.4 MG CAPS capsule, TAKE 1 CAPSULE BY MOUTH EVERY DAY AFTER SUPPER, Disp: 90 capsule, Rfl: 1   [Paused] Upadacitinib  ER ([name] ) 15 MG TB24, Take 15 mg by mouth daily., Disp: , Rfl:    Zinc  Sulfate 220 (50 Zn) MG TABS, Take 1 tablet (220 mg total) by mouth daily with supper., Disp: 100 tablet, Rfl: 0  Observations/Objective: Today's Vitals   Physical Exam Vitals and nursing note reviewed.  Constitutional:      General: She is not in acute distress. HENT:     Head: Normocephalic and atraumatic.  Eyes:     Extraocular Movements: Extraocular movements intact.  Pulmonary:     Effort: Pulmonary effort is normal.  Musculoskeletal:     Cervical back: Normal range of motion.  Skin:    Findings: Lesion and rash present.     Comments: Erythematous vesicular rash to both feet with skin peeling, tender to touch by caregiver, both feet swollen. Unable to view rash to patient's bottom  Neurological:     General: No focal deficit present.     Mental Status: She is alert and oriented to person, place, and time.  Psychiatric:        Mood and Affect: Mood normal.        Behavior: Behavior normal.      Assessment and Plan: Rash  Adverse effect of drug, initial encounter  Peeling skin  Stop the Cipro  immediately. Please go back to the emergency room for further evaluation, I suspect that you are having a serious reaction to the Cipro .  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  Caregiver verbalizes understanding, will get patient to the ER for further evaluation and treatment.   Follow Up Instructions: Return for Follow-up with us  after hospital visit.   I discussed the assessment and treatment plan with the patient. The patient was provided an opportunity to ask questions, and all were answered. The patient agreed with the plan and demonstrated an understanding of the instructions.  The patient was advised to call back or seek an in-person evaluation if the symptoms worsen or if the condition fails to improve as anticipated.  The above assessment and management plan was discussed with the patient. The patient verbalized understanding of and has agreed to the management plan.   Corean LITTIE Ku, FNP

## 2024-03-10 NOTE — Patient Instructions (Signed)
 Stop the Cipro  immediately. Please go back to the emergency room for further evaluation, I suspect that you are having a serious reaction to the Cipro .  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

## 2024-03-11 ENCOUNTER — Observation Stay (HOSPITAL_COMMUNITY)
Admission: EM | Admit: 2024-03-11 | Discharge: 2024-03-12 | Disposition: A | Discharge status: Home / Self Care | Attending: Internal Medicine | Admitting: Internal Medicine

## 2024-03-11 ENCOUNTER — Other Ambulatory Visit: Payer: Self-pay

## 2024-03-11 ENCOUNTER — Telehealth: Payer: Self-pay

## 2024-03-11 DIAGNOSIS — Z96641 Presence of right artificial hip joint: Secondary | ICD-10-CM | POA: Insufficient documentation

## 2024-03-11 DIAGNOSIS — M069 Rheumatoid arthritis, unspecified: Secondary | ICD-10-CM | POA: Insufficient documentation

## 2024-03-11 DIAGNOSIS — R21 Rash and other nonspecific skin eruption: Principal | ICD-10-CM | POA: Diagnosis present

## 2024-03-11 DIAGNOSIS — I10 Essential (primary) hypertension: Secondary | ICD-10-CM | POA: Insufficient documentation

## 2024-03-11 DIAGNOSIS — D649 Anemia, unspecified: Secondary | ICD-10-CM | POA: Diagnosis not present

## 2024-03-11 DIAGNOSIS — Z87891 Personal history of nicotine dependence: Secondary | ICD-10-CM | POA: Insufficient documentation

## 2024-03-11 DIAGNOSIS — Z79899 Other long term (current) drug therapy: Secondary | ICD-10-CM | POA: Insufficient documentation

## 2024-03-11 DIAGNOSIS — Z85828 Personal history of other malignant neoplasm of skin: Secondary | ICD-10-CM | POA: Diagnosis not present

## 2024-03-11 DIAGNOSIS — M8660 Other chronic osteomyelitis, unspecified site: Secondary | ICD-10-CM | POA: Insufficient documentation

## 2024-03-11 LAB — COMPREHENSIVE METABOLIC PANEL WITH GFR
ALT: 13 U/L (ref 0–44)
AST: 21 U/L (ref 15–41)
Albumin: 1.9 g/dL — ABNORMAL LOW (ref 3.5–5.0)
Alkaline Phosphatase: 64 U/L (ref 38–126)
Anion gap: 11 (ref 5–15)
BUN: 5 mg/dL — ABNORMAL LOW (ref 8–23)
CO2: 27 mmol/L (ref 22–32)
Calcium: 8.5 mg/dL — ABNORMAL LOW (ref 8.9–10.3)
Chloride: 97 mmol/L — ABNORMAL LOW (ref 98–111)
Creatinine, Ser: 0.68 mg/dL (ref 0.44–1.00)
GFR, Estimated: >60 mL/min (ref >60)
Glucose, Bld: 83 mg/dL (ref 70–99)
Potassium: 3.3 mmol/L — ABNORMAL LOW (ref 3.5–5.1)
Sodium: 135 mmol/L (ref 135–145)
Total Bilirubin: 0.6 mg/dL (ref 0.0–1.2)
Total Protein: 5.5 g/dL — ABNORMAL LOW (ref 6.5–8.1)

## 2024-03-11 LAB — URINALYSIS, ROUTINE W REFLEX MICROSCOPIC
Bacteria, UA: NONE SEEN
Bilirubin Urine: NEGATIVE
Glucose, UA: NEGATIVE mg/dL
Hgb urine dipstick: NEGATIVE
Ketones, ur: NEGATIVE mg/dL
Nitrite: NEGATIVE
Protein, ur: NEGATIVE mg/dL
Specific Gravity, Urine: 1.009 (ref 1.005–1.030)
pH: 6 (ref 5.0–8.0)

## 2024-03-11 LAB — CBC WITH DIFFERENTIAL/PLATELET
Basophils Absolute: 0 K/uL (ref 0.0–0.1)
Basophils Relative: 0 %
Eosinophils Absolute: 0.1 K/uL (ref 0.0–0.5)
Eosinophils Relative: 3 %
HCT: 27.6 % — ABNORMAL LOW (ref 36.0–46.0)
Hemoglobin: 8.9 g/dL — ABNORMAL LOW (ref 12.0–15.0)
Lymphocytes Relative: 12 %
Lymphs Abs: 0.6 K/uL — ABNORMAL LOW (ref 0.7–4.0)
MCH: 29.6 pg (ref 26.0–34.0)
MCHC: 32.2 g/dL (ref 30.0–36.0)
MCV: 91.7 fL (ref 80.0–100.0)
Monocytes Absolute: 0 K/uL — ABNORMAL LOW (ref 0.1–1.0)
Monocytes Relative: 0 %
Neutro Abs: 4.1 K/uL (ref 1.7–7.7)
Neutrophils Relative %: 85 %
Platelets: 328 K/uL (ref 150–400)
RBC: 3.01 MIL/uL — ABNORMAL LOW (ref 3.87–5.11)
RDW: 21.5 % — ABNORMAL HIGH (ref 11.5–15.5)
WBC: 4.8 K/uL (ref 4.0–10.5)
nRBC: 0 % (ref 0.0–0.2)

## 2024-03-11 LAB — MAGNESIUM: Magnesium: 1.3 mg/dL — ABNORMAL LOW (ref 1.7–2.4)

## 2024-03-11 MED ORDER — RISAQUAD PO CAPS
2.0000 | ORAL_CAPSULE | Freq: Three times a day (TID) | ORAL | Status: DC
  Administered 2024-03-11 – 2024-03-12 (×3): 2 via ORAL
  Filled 2024-03-11 (×5): qty 2

## 2024-03-11 MED ORDER — ATORVASTATIN CALCIUM 10 MG PO TABS: 10.0000 mg | ORAL_TABLET | Freq: Every evening | ORAL | Status: DC

## 2024-03-11 MED ORDER — POLYETHYL GLYCOL-PROPYL GLYCOL 0.4-0.3 % OP SOLN: Freq: Every day | OPHTHALMIC | Status: DC | PRN

## 2024-03-11 MED ORDER — ENSURE PLUS HIGH PROTEIN PO LIQD
237.0000 mL | Freq: Three times a day (TID) | ORAL | Status: DC
  Administered 2024-03-12: 237 mL via ORAL

## 2024-03-11 MED ORDER — PREDNISONE 1 MG PO TABS: 1.0000 mg | ORAL_TABLET | Freq: Every day | ORAL | Status: DC

## 2024-03-11 MED ORDER — HYDROXYZINE HCL 10 MG PO TABS: 10.0000 mg | ORAL_TABLET | Freq: Three times a day (TID) | ORAL | Status: DC | PRN

## 2024-03-11 MED ORDER — POTASSIUM CHLORIDE 10 MEQ/100ML IV SOLN
10.0000 meq | INTRAVENOUS | Status: AC
  Administered 2024-03-11: 10 meq via INTRAVENOUS
  Filled 2024-03-11: qty 100

## 2024-03-11 MED ORDER — POTASSIUM CHLORIDE 10 MEQ/100ML IV SOLN
10.0000 meq | Freq: Once | INTRAVENOUS | Status: AC
  Administered 2024-03-11: 10 meq via INTRAVENOUS
  Filled 2024-03-11: qty 100

## 2024-03-11 MED ORDER — POLYVINYL ALCOHOL 1.4 % OP SOLN
1.0000 [drp] | OPHTHALMIC | Status: DC | PRN
  Filled 2024-03-11: qty 15

## 2024-03-11 MED ORDER — OXYCODONE HCL 5 MG PO TABS
5.0000 mg | ORAL_TABLET | ORAL | Status: DC | PRN
  Administered 2024-03-11 – 2024-03-12 (×2): 5 mg via ORAL
  Filled 2024-03-11 (×2): qty 1

## 2024-03-11 MED ORDER — CIPROFLOXACIN HCL 500 MG PO TABS: 500.0000 mg | ORAL_TABLET | Freq: Once | ORAL | Status: DC

## 2024-03-11 MED ORDER — DIAZEPAM 2 MG PO TABS: 2.0000 mg | ORAL_TABLET | Freq: Every evening | ORAL | Status: DC | PRN

## 2024-03-11 MED ORDER — SODIUM CHLORIDE 0.9 % IV SOLN: INTRAVENOUS | Status: DC

## 2024-03-11 MED ORDER — METHOTREXATE SODIUM 2.5 MG PO TABS: 12.5000 mg | ORAL_TABLET | ORAL | Status: DC

## 2024-03-11 MED ORDER — SODIUM CHLORIDE 0.9% FLUSH
3.0000 mL | Freq: Two times a day (BID) | INTRAVENOUS | Status: DC
  Administered 2024-03-11 – 2024-03-12 (×2): 3 mL via INTRAVENOUS

## 2024-03-11 MED ORDER — SODIUM CHLORIDE 0.9 % IV SOLN
2.0000 g | Freq: Once | INTRAVENOUS | Status: DC
  Filled 2024-03-11: qty 12.5

## 2024-03-11 MED ORDER — MORPHINE SULFATE (PF) 2 MG/ML IV SOLN: 2.0000 mg | INTRAVENOUS | Status: DC | PRN

## 2024-03-11 MED ORDER — ACETAMINOPHEN 325 MG PO TABS: 650.0000 mg | ORAL_TABLET | Freq: Four times a day (QID) | ORAL | Status: DC | PRN

## 2024-03-11 MED ORDER — CIPROFLOXACIN 500 MG/5ML (10%) PO SUSR
500.0000 mg | Freq: Once | ORAL | Status: DC
  Filled 2024-03-11: qty 5

## 2024-03-11 MED ORDER — THIAMINE HCL 100 MG/ML IJ SOLN
100.0000 mg | INTRAMUSCULAR | Status: DC
  Administered 2024-03-11: 100 mg via INTRAVENOUS
  Filled 2024-03-11: qty 2

## 2024-03-11 MED ORDER — SODIUM CHLORIDE 0.9 % IV SOLN
2.0000 g | Freq: Two times a day (BID) | INTRAVENOUS | Status: DC
  Administered 2024-03-12: 2 g via INTRAVENOUS
  Filled 2024-03-11: qty 12.5

## 2024-03-11 MED ADMIN — Tamsulosin HCl Cap 0.4 MG: 0.4 mg | ORAL | NDC 65862059801

## 2024-03-11 MED FILL — Tamsulosin HCl Cap 0.4 MG: 0.4000 mg | ORAL | Qty: 1 | Status: AC

## 2024-03-11 NOTE — ED Provider Notes (Signed)
 Emergency Department Provider Note   I have reviewed the triage vital signs and the nursing notes.   HISTORY  Chief Complaint Rash and Pruritis   HPI Sherri [name] is a 83 y.o. female with past history reviewed below including hip dislocation and with joint infection positive for Pseudomonas, currently on outpatient Cipro .  She developed diffuse rash and saw her family practice doctor yesterday who advised that she discontinue the Cipro  immediately, which she did.  Her caregiver at bedside has been changing the dressings and noticed that the rash has significantly improved over the past several days.  Patient had been on cefepime  with an indwelling PICC line until it was pulled out 2 weeks prior.  Infectious disease decided to move forward with Cipro  in the setting.  She does not have any rash or lesions in her mouth.    Past Medical History:  Diagnosis Date   Anxiety    takes Valium  daily as needed   Basal cell carcinoma 01/21/1988   left nostril (MOHS), sup-right calf (CX35FU)   Basal cell carcinoma 03/22/1996   right calf (CX35FU)   Basal cell carcinoma 03/31/1995   left wing nose   Bruises easily    d/t meds   Cancer (HCC)    basal cell ca, in situ- uterine    Cataracts, bilateral    removed bilateral   Chronic back pain    Dizziness    r/t to meds   GERD (gastroesophageal reflux disease)    no meds on a regular basis but will take Tums if needed   Headache(784.0)    r/t neck issues   History of bronchitis 6-52yrs ago   History of colon polyps    benign   HOH (hard of hearing)    wears hearing aids   HOH (hard of hearing)    wears hearing aids    Hyperlipidemia    takes Atorvastatin  on Mondays and Fridays   Hypertension    Joint pain    Joint swelling    Multiple sclerosis    Neuromuscular disorder (HCC)    Dr. FABIENE Crete- [facility], follows [facility]   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   Rheumatoid arthritis (HCC)    Dr Lorrayne weekly, RA- hands- knees- feet    Rheumatoid arthritis(714.0)    Dr Dolphus takes Xeljanz  daily   Right wrist fracture    Skin cancer    Squamous cell carcinoma of skin 11/02/2001   in situ-right knee (cx45fu)   Squamous cell carcinoma of skin 11/12/2011   in situ-left forearm (CX35FU), in situ-left foot (CX35FU)   Squamous cell carcinoma of skin 01/22/2012   in situ-left lower forearm (txpbx)   Squamous cell carcinoma of skin 11/17/2012   right shin (txpbx)   Squamous cell carcinoma of skin 10/28/2013   in situ-right shoulder (CX35FU)   Squamous cell carcinoma of skin 04/28/2014   well diff-right shin (txpbx)   Squamous cell carcinoma of skin 11/02/2014   in situ-Left shin (txpbx)   Squamous cell carcinoma of skin 12/15/2014   in situ-Left hand (txpbx), bowens-left side chest (txpbx)   Squamous cell carcinoma of skin 05/09/2015   in situ-left hand (txpbx)    Squamous cell carcinoma of skin 05/07/2016   in situ-left inner shin,ant, in situ-left inner shin, post, in situ-left outer forearm, in situ-right  knuckle   Squamous cell carcinoma of skin 03/1302018   in situ-left shoulder (txpbx), in situ-right inner shin (txpbx), in situ-top of left foot (txpbx), KA- left forearm (txpbx)   Squamous cell carcinoma of skin 12/02/2016   in situ-left inner shin (txpbx)   Squamous cell carcinoma of skin 03/04/2017   in situ-left outer sup, shin (txpbx), in situ- right 2nd knuckle finger (txpbx), in situ- Left wrist (txpbx)   Squamous cell carcinoma of skin 04/06/2018   in situ-above left knee inner (txpbx)   Squamous cell carcinoma of skin 04/21/2018   in situ-right top hand (txpbx)   Squamous cell carcinoma of skin 07/08/2018   in situ-right lower inner shin (txpbx)   Squamous cell carcinoma of skin 04/27/2019   in situ- Left neck(CX35FU), In  situ- right neck (CX35FU)   Urinary retention    sees Dr.Wrenn about 2 times a yr    Review of Systems  Constitutional: No fever/chills ENT: No mouth lesions Cardiovascular: Denies chest pain. Respiratory: Denies shortness of breath. Gastrointestinal: No abdominal pain.  No nausea, no vomiting.  Skin: Positive rash.  Neurological: Negative for headaches.   ____________________________________________   PHYSICAL EXAM:  VITAL SIGNS: ED Triage Vitals  Encounter Vitals Group     BP 03/11/24 0831 139/85     Pulse Rate 03/11/24 0831 92     Resp 03/11/24 0831 18     Temp 03/11/24 0831 98.9 F (37.2 C)     Temp src --      SpO2 03/11/24 0831 100 %   Constitutional: Alert and oriented. Well appearing and in no acute distress. Eyes: Conjunctivae are normal. Head: Atraumatic. Nose: No congestion/rhinnorhea. Mouth/Throat: Mucous membranes are moist.  Oropharynx non-erythematous. No oral lesions.  Neck: No stridor. Cardiovascular: Normal rate, regular rhythm. Good peripheral circulation. Grossly normal heart sounds.   Respiratory: Normal respiratory effort.  No retractions. Lungs CTAB. Gastrointestinal: No distention.  Musculoskeletal: Right hip in brace after dislocation.  Compartments are soft in the upper and lower extremities. Neurologic:  Normal speech and language. No gross focal neurologic deficits are appreciated.  Skin:  Skin is warm, dry and intact.  No blistering or sloughing of skin.  Patient does have several areas of dried, healing blistered areas with some mild erythema and swelling to the right leg and right arm.   ____________________________________________   LABS (all labs ordered are listed, but only abnormal results are displayed)  Labs Reviewed  CBC WITH DIFFERENTIAL/PLATELET - Abnormal; Notable for the following components:      Result Value   RBC 3.01 (*)    Hemoglobin 8.9 (*)    HCT 27.6 (*)    RDW 21.5 (*)    Lymphs Abs 0.6 (*)    Monocytes  Absolute 0.0 (*)    All other components within normal limits  COMPREHENSIVE METABOLIC PANEL WITH GFR - Abnormal; Notable for the following components:   Potassium 3.3 (*)    Chloride 97 (*)    BUN 5 (*)    Calcium  8.5 (*)    Total Protein 5.5 (*)    Albumin  1.9 (*)    All other components within normal limits  URINALYSIS, ROUTINE W REFLEX MICROSCOPIC - Abnormal; Notable for the following components:   APPearance HAZY (*)    Leukocytes,Ua TRACE (*)    All other components within normal limits  MAGNESIUM  - Abnormal; Notable for the following components:   Magnesium  1.3 (*)    All other components within normal limits  COMPREHENSIVE  METABOLIC PANEL WITH GFR - Abnormal; Notable for the following components:   Sodium 133 (*)    Potassium 3.2 (*)    BUN <5 (*)    Calcium  7.8 (*)    Total Protein 4.4 (*)    Albumin  <1.5 (*)    All other components within normal limits  CBC - Abnormal; Notable for the following components:   RBC 2.42 (*)    Hemoglobin 7.1 (*)    HCT 21.6 (*)    RDW 21.4 (*)    All other components within normal limits  CULTURE, BLOOD (ROUTINE X 2)  CULTURE, BLOOD (ROUTINE X 2)    ____________________________________________   PROCEDURES  Procedure(s) performed:   Procedures  None ____________________________________________   INITIAL IMPRESSION / ASSESSMENT AND PLAN / ED COURSE  Pertinent labs & imaging results that were available during my care of the patient were reviewed by me and considered in my medical decision making (see chart for details).   This patient is Presenting for Evaluation of rash, which does require a range of treatment options, and is a complaint that involves a high risk of morbidity and mortality.  The Differential Diagnoses include drug reaction, DRESS, SJS, etc.  Critical Interventions-    Medications  acetaminophen  (TYLENOL ) tablet 650 mg (has no administration in time range)  atorvastatin  (LIPITOR) tablet 10 mg (has no  administration in time range)  diazepam  (VALIUM ) tablet 2 mg (has no administration in time range)  methotrexate  (RHEUMATREX) tablet 12.5 mg (has no administration in time range)  oxyCODONE  (Oxy IR/ROXICODONE ) immediate release tablet 5 mg (5 mg Oral Given 03/12/24 0401)  predniSONE  (DELTASONE ) tablet 1 mg (has no administration in time range)  tamsulosin  (FLOMAX ) capsule 0.4 mg (0.4 mg Oral Given 03/11/24 1812)  ceFEPIme  (MAXIPIME ) 2 g in sodium chloride  0.9 % 100 mL IVPB (2 g Intravenous New Bag/Given 03/12/24 0254)  sodium chloride  flush (NS) 0.9 % injection 3 mL (3 mLs Intravenous Not Given 03/11/24 2223)  0.9 %  sodium chloride  infusion ( Intravenous New Bag/Given 03/11/24 1811)  morphine  (PF) 2 MG/ML injection 2 mg (has no administration in time range)  thiamine (VITAMIN B1) injection 100 mg (100 mg Intravenous Given 03/11/24 1813)  acidophilus (RISAQUAD) capsule 2 capsule (2 capsules Oral Given 03/11/24 2233)  potassium chloride  10 mEq in 100 mL IVPB (10 mEq Intravenous New Bag/Given 03/11/24 1812)  artificial tears ophthalmic solution 1 drop (has no administration in time range)  feeding supplement (ENSURE PLUS HIGH PROTEIN) liquid 237 mL (237 mLs Oral Not Given 03/11/24 2018)  hydrOXYzine  (ATARAX ) tablet 10 mg (has no administration in time range)  potassium chloride  10 mEq in 100 mL IVPB (10 mEq Intravenous New Bag/Given 03/11/24 2038)     Reassessment after intervention:  no worsening rash.    I did obtain Additional Historical Information from caregiver and husband at bedside.   I decided to review pertinent External Data, and in summary Pseudomonas is the offending organism requiring abx from chart review and last admit.   Clinical Laboratory Tests Ordered, included CBC without leukocytosis.  Mild anemia to 8.9 similar to prior.  No acute kidney injury.  Cardiac Monitor Tracing which shows NSR.    Social Determinants of Health Risk patient is not an active smoker.    Consult complete with [facility]. Plan for admit for PICC and to restart Cefepeme.   Medical Decision Making: Summary:  Patient presents to the emergency department with rash which is rapidly improving.  It looks well on  my evaluation.  My overall suspicion for SJS is exceedingly low.  She may be having a drug reaction and unfortunately given the Pseudomonas infection she continues to require antibiotics.  I plan to bring her back into the hospital for cefepime  and to consider replacing the PICC line for home infusions.  Reevaluation with update and discussion with patient and family. Plan for admit. They are in agreement.   Patient's presentation is most consistent with acute presentation with potential threat to life or bodily function.   Disposition: admit  ____________________________________________  FINAL CLINICAL IMPRESSION(S) / ED DIAGNOSES  Final diagnoses:  Rash    Note:  This document was prepared using Dragon voice recognition software and may include unintentional dictation errors.  Fonda Law, MD, [facility] Emergency Medicine    Kataleyah Carducci, Fonda MATSU, MD 03/12/24 [phone]

## 2024-03-11 NOTE — H&P (Signed)
 History and Physical    Patient: Sherri [name]:[id number] DOB: 01/20/1941 DOA: 03/11/2024 DOS: the patient was seen and examined on 03/11/2024 . PCP: [name] PARAS, MD  Patient coming from: Home Chief complaint: Chief Complaint  Patient presents with   Rash   Pruritis   HPI:  Nil  Sherri [name] is Sherri 83 y.o. 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 Fidel on (7/10). patient was on IV cefepime  through PICC line within expected completion date of October 13, 20 25 with transition to p.o. ciprofloxacin .     ED Course:  Vital signs in the ED were notable for the following:  Vitals:   03/11/24 0831 03/11/24 1220  BP: 139/85 120/60  Pulse: 92 (!) 103  Temp: 98.9 F (37.2 C) 98.8 F (37.1 C)  Resp: 18 20  SpO2: 100% 100%   >>ED evaluation thus far shows: CBC shows white count of 4.8 hemoglobin stable at 8.9 platelets 328. CMP shows hypokalemia of 3.3 albumin  of 1.9 normal electrolytes and normal kidney function normal LFTs.  >>While in the ED patient received the following: Medications  ceFEPIme  (MAXIPIME ) 2 g in sodium chloride  0.9 % 100 mL IVPB (has no administration in time range)   Review of Systems  Skin:  Positive for rash.  Neurological:  Positive for weakness.  All other systems reviewed and are negative.  Past Medical History:  Diagnosis Date   Anxiety    takes Valium  daily as needed   Basal cell carcinoma 01/21/1988   left nostril (MOHS),  sup-right calf (CX35FU)   Basal cell carcinoma 03/22/1996   right calf (CX35FU)   Basal cell carcinoma 03/31/1995   left wing nose   Bruises easily    d/t meds   Cancer (HCC)    basal cell ca, in situ- uterine    Cataracts, bilateral    removed bilateral   Chronic back pain    Dizziness    r/t to meds   GERD (gastroesophageal reflux disease)    no meds on Sherri regular basis but will take Tums if needed   Headache(784.0)    r/t neck issues   History of bronchitis 6-73yrs ago   History of colon polyps    benign   HOH (hard of hearing)    wears hearing aids   HOH (hard of hearing)    wears hearing aids    Hyperlipidemia    takes Atorvastatin  on Mondays and Fridays   Hypertension    Joint pain    Joint swelling    Multiple sclerosis    Neuromuscular disorder (HCC)    Dr. FABIENE Crete- [facility], follows [facility]   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   Rheumatoid arthritis (HCC)    Dr Lorrayne weekly, RA- hands- knees- feet    Rheumatoid arthritis(714.0)    Dr Dolphus takes Xeljanz  daily   Right wrist fracture    Skin cancer  Squamous cell carcinoma of skin 11/02/2001   in situ-right knee (cx60fu)   Squamous cell carcinoma of skin 11/12/2011   in situ-left forearm (CX35FU), in situ-left foot (CX35FU)   Squamous cell carcinoma of skin 01/22/2012   in situ-left lower forearm (txpbx)   Squamous cell carcinoma of skin 11/17/2012   right shin (txpbx)   Squamous cell carcinoma of skin 10/28/2013   in situ-right shoulder (CX35FU)   Squamous cell carcinoma of skin 04/28/2014   well diff-right shin (txpbx)   Squamous cell carcinoma of skin 11/02/2014   in situ-Left shin (txpbx)   Squamous cell carcinoma of skin 12/15/2014   in situ-Left hand (txpbx), bowens-left side chest (txpbx)   Squamous cell carcinoma of skin  05/09/2015   in situ-left hand (txpbx)    Squamous cell carcinoma of skin 05/07/2016   in situ-left inner shin,ant, in situ-left inner shin, post, in situ-left outer forearm, in situ-right knuckle   Squamous cell carcinoma of skin 03/1302018   in situ-left shoulder (txpbx), in situ-right inner shin (txpbx), in situ-top of left foot (txpbx), KA- left forearm (txpbx)   Squamous cell carcinoma of skin 12/02/2016   in situ-left inner shin (txpbx)   Squamous cell carcinoma of skin 03/04/2017   in situ-left outer sup, shin (txpbx), in situ- right 2nd knuckle finger (txpbx), in situ- Left wrist (txpbx)   Squamous cell carcinoma of skin 04/06/2018   in situ-above left knee inner (txpbx)   Squamous cell carcinoma of skin 04/21/2018   in situ-right top hand (txpbx)   Squamous cell carcinoma of skin 07/08/2018   in situ-right lower inner shin (txpbx)   Squamous cell carcinoma of skin 04/27/2019   in situ- Left neck(CX35FU), In situ- right neck (CX35FU)   Urinary retention    sees Dr.Wrenn about 2 times Sherri yr   Past Surgical History:  Procedure Laterality Date   ABDOMINAL HYSTERECTOMY     1985   ANTERIOR CERVICAL DECOMP/DISCECTOMY FUSION N/Sherri 04/23/2022   Procedure: Anterior Cervical Decompression/Discectomy Fusion - Cervical Seven-Thoracic One;  Surgeon: Louis Shove, MD;  Location: [facility];  Service: Neurosurgery;  Laterality: N/Sherri;  3C   ANTERIOR HIP REVISION Right 01/21/2024   Procedure: REVISION, TOTAL ARTHROPLASTY, HIP, ANTERIOR APPROACH;  Surgeon: Fidel Rogue, MD;  Location: [facility];  Service: Orthopedics;  Laterality: Right;   APPENDECTOMY     with TAH   BACK SURGERY     several   BREAST BIOPSY Right    Results were negative   BROW LIFT Bilateral 10/02/2016   Procedure: BILATERAL LOWER LID BLEPHAROPLASTY;  Surgeon: Laurie Loyd Redhead, MD;  Location: [facility];  Service: Plastics;  Laterality: Bilateral;   CARPAL TUNNEL RELEASE Right    cataracts     CERVICAL FUSION     Dr Leeann    CERVICAL FUSION  12/31/2012   Dr Leeann   CERVICAL FUSION  04/2022   CHEST TUBE INSERTION     for traumatic Pneumothorax   COLONOSCOPY  07/16/2010   normal    COLONOSCOPY W/ POLYPECTOMY  1997   negative since; Dr Debrah   ESOPHAGOGASTRODUODENOSCOPY  07/16/2010   normal   eye lid raise     EYE SURGERY Bilateral    cataracts removed - /w IOL   HARDWARE REMOVAL  08/2021   HIP CLOSED REDUCTION Right 11/16/2023   Procedure: CLOSED MANIPULATION, JOINT, HIP;  Surgeon: Ernie Cough, MD;  [facility]: [facility];  Service: Orthopedics;  Laterality: Right;   HIP CLOSED REDUCTION N/Sherri 11/30/2023  Procedure: CLOSED REDUCTION, HIP;  Surgeon: Fidel Rogue, MD;  [facility]: [facility];  Service: Orthopedics;  Laterality: N/Sherri;   INCISION AND DRAINAGE OF WOUND Right 11/28/2023   Procedure: IRRIGATION AND DEBRIDEMENT WOUND; REMOVAL OF HARDWARE;  Surgeon: Edna Toribio LABOR, MD;  Location: [facility];  Service: Orthopedics;  Laterality: Right;  I & D RIGHT ELBOW   JOINT REPLACEMENT     LUMBAR FUSION     Dr Leeann   NASAL SINUS SURGERY     ORIF ELBOW FRACTURE Right 11/20/2023   Procedure: OPEN REDUCTION INTERNAL FIXATION (ORIF) ELBOW/OLECRANON FRACTURE;  Surgeon: [name] Franky SQUIBB, MD;  Location: [facility];  Service: Orthopedics;  Laterality: Right;   POSTERIOR CERVICAL FUSION/FORAMINOTOMY N/Sherri 12/31/2012   Procedure: POSTERIOR LATERAL CERVICAL FUSION/FORAMINOTOMY LEVEL 1 CERVICAL THREE-FOUR WITH LATERAL MASS SCREWS;  Surgeon: Catalina CHRISTELLA Leeann, MD;  [facility]: [facility];  Service: Neurosurgery;  Laterality: N/Sherri;   PTOSIS REPAIR Bilateral 10/02/2016   Procedure: INTERNAL PTOSIS REPAIR;  Surgeon: Laurie Loyd Redhead, MD;  Location: [facility];  Service: Plastics;  Laterality: Bilateral;   SKIN BIOPSY     THYROID  SURGERY     R lobe removed- 1972, has grown back - CT last done- 2018   TOTAL HIP ARTHROPLASTY  12/22/2011   Procedure: TOTAL HIP ARTHROPLASTY;  Surgeon: Dempsey JINNY Sensor, MD;  Location: [facility];  Service: Orthopedics;   Laterality: Left;   TOTAL HIP ARTHROPLASTY Right 11/12/2023   Procedure: ARTHROPLASTY, HIP, TOTAL, ANTERIOR APPROACH;  Surgeon: Fidel Rogue, MD;  Location: [facility];  Service: Orthopedics;  Laterality: Right;   TOTAL HIP REVISION Right 12/03/2023   Procedure: TOTAL HIP REVISION;  Surgeon: Fidel Rogue, MD;  Location: [facility];  Service: Orthopedics;  Laterality: Right;  Right acetabular revision Anterior approach   TOTAL SHOULDER ARTHROPLASTY     TUBAL LIGATION     UPPER GASTROINTESTINAL ENDOSCOPY  2012   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   WRIST SURGERY Left 09/16/2022   wrist/arm    reports that she quit smoking about 42 years ago. Her smoking use included cigarettes. She started smoking about 62 years ago. She has Sherri 50 pack-year smoking history. She has never been exposed to tobacco smoke. She has never used smokeless tobacco. She reports current alcohol  use of about 14.0 standard drinks of alcohol  per week. She reports that she does not use drugs. Allergies  Allergen Reactions   Darvon [Propoxyphene Hcl] Shortness Of Breath    ? Dose Related ? Lowered respirations greatly   Demerol [Meperidine] Shortness Of Breath and Other (See Comments)    Respiratory Distress   Penicillins Hives and Other (See Comments)    Has patient had Sherri PCN reaction causing immediate rash, facial/tongue/throat swelling, SOB or lightheadedness with hypotension: No SEVERE RASH INVOLVING MUCUS MEMBRANES or SKIN NECROSIS: #  #  #  YES  #  #  #  Has patient had Sherri PCN reaction that required hospitalization No Has patient had Sherri PCN reaction occurring within the last 10 years: No.    Imuran [Azathioprine] Other (See Comments)    Weakness   Sulfa  Antibiotics Nausea Only and Other (See Comments)    States had severe indigestion and heartburn and nausea and had to stop taking   Arava [Leflunomide] Other (See Comments)    Excessive weight gain   Lipitor [Atorvastatin ] Nausea And Vomiting    Currently taking 2  times weekly - able to tolerate low dose    Vytorin [Ezetimibe-Simvastatin] Nausea And Vomiting  Family History  Problem Relation Age of Onset   Cancer Mother        pancreatic   Diabetes Mother    Pancreatic cancer Mother    Heart disease Father        Rheumatic   Cancer Father        ? stomach   Stomach cancer Father    Cancer Sister        stomach   Stomach cancer Sister    Cancer Maternal Aunt        X 4; ? primary   Heart disease Paternal Aunt    Cancer Maternal Grandmother        cervical   Colon cancer Other        Aunts   Multiple sclerosis Daughter    Hypertension Neg Hx    Stroke Neg Hx    Esophageal cancer Neg Hx    Rectal cancer Neg Hx    Prior to Admission medications   Medication Sig Start Date End Date Taking? Authorizing Provider  acetaminophen  (TYLENOL ) 325 MG tablet Take 2 tablets (650 mg total) by mouth every 6 (six) hours as needed for mild pain (pain score 1-3) or fever (or Fever >/= 101). 11/20/23   Dorinda Drue DASEN, MD  ascorbic acid  (VITAMIN C ) 1000 MG tablet Take 1 tablet (1,000 mg total) by mouth daily. 12/23/23   Jerilynn Daphne SAILOR, NP  atorvastatin  (LIPITOR) 10 MG tablet Take 1 tablet (10 mg total) by mouth every evening. 12/22/23   Jerilynn Daphne SAILOR, NP  Biotin  5000 MCG TABS Take 10,000 mcg by mouth in the morning.    [provider]  bisacodyl  (DULCOLAX) 10 MG suppository Place 1 suppository (10 mg total) rectally daily as needed for moderate constipation. 11/20/23   Dorinda Drue DASEN, MD  Carboxymethylcellul-Glycerin  (LUBRICATING EYE DROPS OP) Place 1 drop into both eyes daily as needed (dry eyes).    [provider]  cholecalciferol  (VITAMIN D ) 25 MCG (1000 UNIT) tablet Take 1,000 Units by mouth in the morning.    [provider]  ciprofloxacin  (CIPRO ) 500 MG tablet Take 1 tablet (500 mg total) by mouth 2 (two) times daily. 02/26/24   Fleeta Kathie Jomarie SAILOR, MD  cyclobenzaprine  (FLEXERIL ) 10 MG tablet TAKE 1 TABLET BY MOUTH  THREE TIMES Sherri DAY AS NEEDED FOR MUSCLE SPASM Patient taking differently: Take 10 mg by mouth 3 (three) times daily as needed for muscle spasms. 06/11/23   [name] PARAS, MD  denosumab  (PROLIA ) 60 MG/ML SOSY injection Inject 60 mg into the skin every 6 (six) months. Deliver to rheum: [address], [address], [place], [place] [address]. Appt on 07/23/2022 07/17/22 11/26/24  Cheryl Waddell HERO, PA-C  diazepam  (VALIUM ) 5 MG tablet TAKE 1 TABLET BY MOUTH AT BEDTIME AS NEEDED FOR MUSCLE SPASMS. 02/12/24   [name] PARAS, MD  docusate sodium  (COLACE) 100 MG capsule Take 1 capsule (100 mg total) by mouth daily. 11/21/23   Dorinda Drue DASEN, MD  DULoxetine  (CYMBALTA ) 20 MG capsule TAKE 1 CAPSULE BY MOUTH AT BEDTIME. 02/09/24   Burns, Glade PARAS, MD  folic acid  (FOLVITE ) 1 MG tablet Take 1 tablet (1 mg total) by mouth daily. 09/02/23   Cheryl Waddell HERO, PA-C  furosemide  (LASIX ) 20 MG tablet Take 0.5 tablets (10 mg total) by mouth daily. 12/23/23   Jerilynn Daphne SAILOR, NP  magic mouthwash (nystatin , lidocaine , diphenhydrAMINE , alum & mag hydroxide) suspension Swish and spit 5 mLs 3 (three) times daily as needed  for mouth pain. 01/13/24   [name] PARAS, MD  methotrexate  (RHEUMATREX) 2.5 MG tablet TAKE 5 TABLETS (12.5 MG TOTAL) BY MOUTH ONCE Sherri WEEK. CAUTION:CHEMOTHERAPY. PROTECT FROM LIGHT. 03/01/24   Dolphus Reiter, MD  Nystatin  (GERHARDT'S BUTT CREAM) CREA Apply 1 Application topically 3 (three) times daily. 01/06/24   [name] PARAS, MD  oxyCODONE  (ROXICODONE ) 5 MG immediate release tablet Take 1 tablet (5 mg total) by mouth every 4 (four) hours as needed for severe pain (pain score 7-10). 01/26/24   Hill, Valery RAMAN, PA-C  pantoprazole  (PROTONIX ) 40 MG tablet Take 1 tablet (40 mg total) by mouth 2 (two) times daily before Sherri meal. 07/27/23   Burns, Glade PARAS, MD  polyethylene glycol powder (GLYCOLAX /MIRALAX ) 17 GM/SCOOP powder Take 17 g by mouth 2 (two) times daily. Patient taking differently: Take 17 g by mouth daily. 12/22/23   Jerilynn Daphne SAILOR, NP  potassium chloride  SA (KLOR-CON  M) 20 MEQ tablet TAKE 1 TABLET (20 MEQ TOTAL) BY MOUTH EVERY OTHER DAY. 03/07/24   [name] PARAS, MD  predniSONE  (DELTASONE ) 5 MG tablet TAKE 1 TABLET BY MOUTH EVERY DAY IN THE MORNING 05/22/23   Cheryl Waddell HERO, PA-C  senna (SENOKOT) 8.6 MG tablet Take 1 tablet by mouth at bedtime.    [provider]  sodium chloride  1 g tablet Take 1 tablet (1 g total) by mouth 2 (two) times daily with Sherri meal. 02/03/24   Burns, Glade PARAS, MD  tamsulosin  (FLOMAX ) 0.4 MG CAPS capsule TAKE 1 CAPSULE BY MOUTH EVERY DAY AFTER SUPPER 02/09/24   [name] PARAS, MD  Upadacitinib  ER ([name] ) 15 MG TB24 Take 15 mg by mouth daily.    [provider]  Zinc  Sulfate 220 (50 Zn) MG TABS Take 1 tablet (220 mg total) by mouth daily with supper. 12/22/23   Jerilynn Daphne SAILOR, NP                                                                                 Vitals:   03/11/24 0831 03/11/24 1220  BP: 139/85 120/60  Pulse: 92 (!) 103  Resp: 18 20  Temp: 98.9 F (37.2 C) 98.8 F (37.1 C)  SpO2: 100% 100%   Physical Exam Vitals reviewed.  Constitutional:      General: She is not in acute distress.    Appearance: She is ill-appearing.  HENT:     Head: Normocephalic.  Eyes:     Extraocular Movements: Extraocular movements intact.  Cardiovascular:     Rate and Rhythm: Normal rate and regular rhythm.     Heart sounds: Normal heart sounds.  Pulmonary:     Breath sounds: Normal breath sounds.  Abdominal:     General: There is no distension.     Palpations: Abdomen is soft.     Tenderness: There is no abdominal tenderness.  Musculoskeletal:     Right lower leg: No edema.     Left lower leg: No edema.  Neurological:     General: No focal deficit present.     Mental Status: She is alert and oriented to person, place, and time.     Labs on Admission: I have personally  reviewed following labs and imaging studies CBC: Recent Labs  Lab 03/11/24 0915  WBC  4.8  NEUTROABS 4.1  HGB 8.9*  HCT 27.6*  MCV 91.7  PLT 328   Basic Metabolic Panel: Recent Labs  Lab 03/11/24 0915  NA 135  K 3.3*  CL 97*  CO2 27  GLUCOSE 83  BUN 5*  CREATININE 0.68  CALCIUM  8.5*   GFR: CrCl cannot be calculated (Unknown ideal weight.). Liver Function Tests: Recent Labs  Lab 03/11/24 0915  AST 21  ALT 13  ALKPHOS 64  BILITOT 0.6  PROT 5.5*  ALBUMIN  1.9*   No results for input(s): LIPASE, AMYLASE in the last 168 hours. No results for input(s): AMMONIA in the last 168 hours. Recent Labs    12/22/23 0456 12/23/23 0432 01/18/24 2239 01/20/24 0521 01/22/24 0903 01/23/24 0828 01/24/24 0241 01/25/24 0557 01/26/24 0219 03/11/24 0915  BUN 11 5* 6* 6* 7* 8 7* 6* <5* 5*  CREATININE 0.49 0.52 0.80 0.46 0.52 0.48 0.38* 0.51 0.47 0.68    Cardiac Enzymes: No results for input(s): CKTOTAL, CKMB, CKMBINDEX, TROPONINI in the last 168 hours. BNP (last 3 results) No results for input(s): PROBNP in the last 8760 hours. HbA1C: No results for input(s): HGBA1C in the last 72 hours. CBG: No results for input(s): GLUCAP in the last 168 hours. Lipid Profile: No results for input(s): CHOL, HDL, LDLCALC, TRIG, CHOLHDL, LDLDIRECT in the last 72 hours. Thyroid  Function Tests: No results for input(s): TSH, T4TOTAL, FREET4, T3FREE, THYROIDAB in the last 72 hours. Anemia Panel: No results for input(s): VITAMINB12, FOLATE, FERRITIN, TIBC, IRON, RETICCTPCT in the last 72 hours. Urine analysis:    Component Value Date/Time   COLORURINE YELLOW 03/11/2024 1518   APPEARANCEUR HAZY (Sherri) 03/11/2024 1518   LABSPEC 1.009 03/11/2024 1518   PHURINE 6.0 03/11/2024 1518   GLUCOSEU NEGATIVE 03/11/2024 1518   GLUCOSEU NEGATIVE 04/10/2020 0914   HGBUR NEGATIVE 03/11/2024 1518   HGBUR trace-lysed 05/29/2010 0750   BILIRUBINUR NEGATIVE 03/11/2024 1518   KETONESUR NEGATIVE 03/11/2024 1518   PROTEINUR NEGATIVE 03/11/2024  1518   UROBILINOGEN 0.2 04/10/2020 0914   NITRITE NEGATIVE 03/11/2024 1518   LEUKOCYTESUR TRACE (Sherri) 03/11/2024 1518   Radiological Exams on Admission: No results found. Data Reviewed: Relevant notes from primary care and specialist visits, past discharge summaries as available in EHR, including Care Everywhere . Prior diagnostic testing as pertinent to current admission diagnoses, Updated medications and problem lists for reconciliation .ED course, including vitals, labs, imaging, treatment and response to treatment,Triage notes, nursing and pharmacy notes and ED provider's notes.Notable results as noted in HPI.Discussed case with EDMD/ ED APP/ or Specialty MD on call and as needed.  Assessment & Plan  83 year old female with past medical history of RA and MS presenting with Sherri rash that has been going on for the past few days.  Patient has also unfortunately had many complications of her orthopedic surgery and infection and dislocation due to her poor nutrition and immune status and chronic steroid therapy for her RA.  >>Rash: Generalized MP rash and suspect from her ciprofloxacin . PRN atarax  and pt is on prednisone .     >>Right hip OM: Will d/w pharmacy about plan and resuming cefepime   and also starting probiotic supplement .  Ortho consult as deemed appropriate per am team. Wound care.   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 H/h is stable. Type/ screen.  Iv  ppi.    >>RA: Continued methotrexate  12.5 mg on Wednesdays and  prednisone  and folate.    DVT prophylaxis:  scd's Consults:  None   Advance Care Planning:    Code Status: Full Code   Family Communication:  None  Disposition Plan:  Home.  Severity of Illness: The appropriate patient status for this patient is OBSERVATION. Observation status is judged to be reasonable and necessary in order to provide the required intensity of service to ensure the patient's safety. The patient's presenting symptoms, physical exam  findings, and initial radiographic and laboratory data in the context of their medical condition is felt to place them at decreased risk for further clinical deterioration. Furthermore, it is anticipated that the patient will be medically stable for discharge from the hospital within 2 midnights of admission.   Unresulted Labs (From admission, onward)     Start     Ordered   03/12/24 0500  Comprehensive metabolic panel  Tomorrow morning,   R        03/11/24 1541   03/12/24 0500  CBC  Tomorrow morning,   R        03/11/24 1541   03/11/24 1541  Magnesium   Add-on,   AD        03/11/24 1541   03/11/24 1458  Culture, blood (routine x 2)  BLOOD CULTURE X 2,   R (with STAT occurrences)      03/11/24 1457            Meds ordered this encounter  Medications   DISCONTD: ciprofloxacin  (CIPRO ) 500 MG/5ML (10%) suspension 500 mg   DISCONTD: ciprofloxacin  (CIPRO ) tablet 500 mg   DISCONTD: ceFEPIme  (MAXIPIME ) 2 g in sodium chloride  0.9 % 100 mL IVPB    Antibiotic Indication::   Osteomyelitis   acetaminophen  (TYLENOL ) tablet 650 mg   atorvastatin  (LIPITOR) tablet 10 mg   DISCONTD: Polyethyl Glycol-Propyl Glycol 0.4-0.3 % SOLN   diazepam  (VALIUM ) tablet 2 mg   methotrexate  (RHEUMATREX) tablet 12.5 mg   oxyCODONE  (Oxy IR/ROXICODONE ) immediate release tablet 5 mg    Refill:  0   predniSONE  (DELTASONE ) tablet 1 mg   tamsulosin  (FLOMAX ) capsule 0.4 mg   ceFEPIme  (MAXIPIME ) 2 g in sodium chloride  0.9 % 100 mL IVPB    Antibiotic Indication::   Osteomyelitis   sodium chloride  flush (NS) 0.9 % injection 3 mL   0.9 %  sodium chloride  infusion   morphine  (PF) 2 MG/ML injection 2 mg   thiamine (VITAMIN B1) injection 100 mg   acidophilus (RISAQUAD) capsule 2 capsule   potassium chloride  10 mEq in 100 mL IVPB   artificial tears ophthalmic solution 1 drop   feeding supplement (ENSURE PLUS HIGH PROTEIN) liquid 237 mL   hydrOXYzine  (ATARAX ) tablet 10 mg     Orders Placed This Encounter  Procedures    Culture, blood (routine x 2)   CBC with Differential   Comprehensive metabolic panel   Urinalysis, Routine w reflex microscopic -Urine, Clean Catch   Comprehensive metabolic panel   CBC   Magnesium    Diet regular Room service appropriate? Yes with Assist; Fluid consistency: Nectar Thick   Swallow screen   Maintain IV access   Vital signs   Notify physician (specify)   Mobility Protocol: No Restrictions   Refer to Sidebar Report Mobility Protocol for Adult Inpatient   Initiate Adult Central Line Maintenance and Catheter Protocol for patients with central line (CVC, PICC, Port, Hemodialysis, Trialysis)   Daily weights   Intake and Output   Initiate CHG Protocol   Do not place and  if present remove PureWick   Initiate Oral Care Protocol   Initiate Carrier Fluid Protocol   RN may order General Admission PRN Orders utilizing General Admission PRN medications (through manage orders) for the following patient needs: allergy symptoms (Claritin), cold sores (Carmex), cough (Robitussin DM), eye irritation (Liquifilm Tears), hemorrhoids (Tucks), indigestion (Maalox), minor skin irritation (Hydrocortisone  Cream), muscle pain Lucienne Gay), nose irritation (saline nasal spray) and sore throat (Chloraseptic spray).   Cardiac Monitoring - Continuous Indefinite   Full code   Consult to hospitalist   Consult to Registered Dietitian   Pulse oximetry check with vital signs   Oxygen therapy Mode or (Route): Nasal cannula; Liters Per Minute: 2; Keep O2 saturation between: greater than 92 %   Saline lock IV   Place in observation (patient's expected length of stay will be less than 2 midnights)   Aspiration precautions   Fall precautions    Author: Mario LULLA Blanch, MD 12 pm- 8 pm. [facility]. 03/11/2024 5:55 PM Please note for any communication after hours contact [facility] Assigned provider on call on Amion.

## 2024-03-11 NOTE — Progress Notes (Signed)
 ED Pharmacy Antibiotic Sign Off An antibiotic consult was received from an ED provider for cefepime  per pharmacy dosing for prosthetic hip infection with pseudomonas. A chart review was completed to assess appropriateness.   The following one time order(s) were placed:  Cefepime  2g IV  Further antibiotic and/or antibiotic pharmacy consults should be ordered by the admitting provider if indicated.   Thank you for allowing pharmacy to be a part of this patient's care.   Maurilio Patten, PharmD PGY1 Pharmacy Resident [facility] 03/11/2024 2:52 PM

## 2024-03-11 NOTE — Telephone Encounter (Signed)
 Copied from CRM [id number]. Topic: General - Other >> Mar 10, 2024 11:38 AM Turkey A wrote: Reason for CRM: Patient and caregiver called because pt had video appt with Dr.Burns and was advised to go to the hospital. Patient said that she uses transportation and how would she get to admissions? Agent called [facility] and was informed that the agent will need to tell the patient to call 911. Agent informed patient to call 911 to be transported to hospital.

## 2024-03-11 NOTE — ED Notes (Signed)
 Pt asked to provide urine sample and provided with specimen cup. Pt and visitor stated that she will likely have to have a cath because she is incontinent and does not know when she needs to urinate. RN notified.

## 2024-03-11 NOTE — ED Notes (Signed)
 Phone report called to 2W RN.  I had this patient in my care for less than 30 minutes. Primary assessment unremarkable.

## 2024-03-11 NOTE — ED Notes (Signed)
 Extra DG & SST top drawn/ Unable to provide urine, sample cup in hand

## 2024-03-11 NOTE — ED Triage Notes (Signed)
 Pt. Stated, Sherri [name] had a rash 5 days that's been on my bottom. Its all over. Ive had it all over.

## 2024-03-12 ENCOUNTER — Other Ambulatory Visit (HOSPITAL_COMMUNITY): Payer: Self-pay

## 2024-03-12 ENCOUNTER — Encounter (HOSPITAL_COMMUNITY): Payer: Self-pay | Admitting: Internal Medicine

## 2024-03-12 ENCOUNTER — Encounter: Payer: Self-pay | Admitting: Rheumatology

## 2024-03-12 DIAGNOSIS — M8660 Other chronic osteomyelitis, unspecified site: Secondary | ICD-10-CM | POA: Diagnosis not present

## 2024-03-12 DIAGNOSIS — R21 Rash and other nonspecific skin eruption: Secondary | ICD-10-CM | POA: Diagnosis not present

## 2024-03-12 LAB — COMPREHENSIVE METABOLIC PANEL WITH GFR
ALT: 10 U/L (ref 0–44)
AST: 16 U/L (ref 15–41)
Albumin: <1.5 g/dL — ABNORMAL LOW (ref 3.5–5.0)
Alkaline Phosphatase: 52 U/L (ref 38–126)
Anion gap: 10 (ref 5–15)
BUN: <5 mg/dL — ABNORMAL LOW (ref 8–23)
CO2: 25 mmol/L (ref 22–32)
Calcium: 7.8 mg/dL — ABNORMAL LOW (ref 8.9–10.3)
Chloride: 98 mmol/L (ref 98–111)
Creatinine, Ser: 0.67 mg/dL (ref 0.44–1.00)
GFR, Estimated: >60 mL/min (ref >60)
Glucose, Bld: 81 mg/dL (ref 70–99)
Potassium: 3.2 mmol/L — ABNORMAL LOW (ref 3.5–5.1)
Sodium: 133 mmol/L — ABNORMAL LOW (ref 135–145)
Total Bilirubin: 0.6 mg/dL (ref 0.0–1.2)
Total Protein: 4.4 g/dL — ABNORMAL LOW (ref 6.5–8.1)

## 2024-03-12 LAB — CBC
HCT: 21.6 % — ABNORMAL LOW (ref 36.0–46.0)
Hemoglobin: 7.1 g/dL — ABNORMAL LOW (ref 12.0–15.0)
MCH: 29.3 pg (ref 26.0–34.0)
MCHC: 32.9 g/dL (ref 30.0–36.0)
MCV: 89.3 fL (ref 80.0–100.0)
Platelets: 246 K/uL (ref 150–400)
RBC: 2.42 MIL/uL — ABNORMAL LOW (ref 3.87–5.11)
RDW: 21.4 % — ABNORMAL HIGH (ref 11.5–15.5)
WBC: 4.3 K/uL (ref 4.0–10.5)
nRBC: 0 % (ref 0.0–0.2)

## 2024-03-12 LAB — GLUCOSE, CAPILLARY: Glucose-Capillary: 74 mg/dL (ref 70–99)

## 2024-03-12 MED ORDER — POLYETHYLENE GLYCOL 3350 17 GM/SCOOP PO POWD: 17.0000 g | Freq: Every day | ORAL | Status: AC

## 2024-03-12 MED ORDER — ACIDOPHILUS PO CAPS
2.0000 | ORAL_CAPSULE | Freq: Three times a day (TID) | ORAL | 0 refills | Status: AC
  Filled 2024-03-12: qty 180, 30d supply, fill #0

## 2024-03-12 MED ORDER — HYDROXYZINE HCL 10 MG PO TABS
10.0000 mg | ORAL_TABLET | Freq: Three times a day (TID) | ORAL | 0 refills | Status: DC | PRN
  Filled 2024-03-12: qty 30, 10d supply, fill #0

## 2024-03-12 NOTE — Care Management Obs Status (Signed)
 MEDICARE OBSERVATION STATUS NOTIFICATION   Patient Details  Name: Sherri [name] MRN: [id number] Date of Birth: 11-21-1940   Medicare Observation Status Notification Given:  Yes    Yesli Vanderhoff G., RN 03/12/2024, 9:52 AM

## 2024-03-12 NOTE — Progress Notes (Signed)
 Discharge Nurse Summary: DC order noted per MD. DC RN at bedside with patient. Patient agreeable with discharge plan, CG at the bedside. AVS printed/reviewed. PIV removed, skin intact. No DME needs. No home meds. TOC meds pending pickup. CP/Edu resolved. Telemonitor returned to charging station. All belongings accounted for. Patient wheeled downstairs for discharge by private auto. TOC meds picked up on the way out.  Rosario EMERSON Lund, RN

## 2024-03-12 NOTE — Plan of Care (Signed)

## 2024-03-12 NOTE — Discharge Summary (Signed)
 Physician Discharge Summary   Patient: Sherri [name] MRN: [id number] DOB: Mar 23, 1941  Admit date:     03/11/2024  Discharge date: 03/12/24  Discharge Physician: Carliss LELON Canales   PCP: [name] PARAS, MD   Recommendations at discharge:    Pt to be discharged home.   If you experience worsening fever, chills, chest pain, shortness of breath, or other concerning symptoms, please call your PCP or go to the emergency department immediately.  Discharge Diagnoses: Active Problems:   Rash and nonspecific skin eruption  Resolved Problems:   * No resolved hospital problems. *   Hospital Course:  83 y.o. 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 Fidel on (7/10). patient was on IV cefepime  through PICC line within expected completion date of October 13, 20 25 with transition to p.o. ciprofloxacin .        ED Course:  Vital signs in the ED were notable for the following:  Multiple Vitals      Vitals:    03/11/24 0831 03/11/24 1220  BP: 139/85 120/60  Pulse: 92 (!) 103  Temp: 98.9 F (37.2 C) 98.8 F (37.1 C)  Resp: 18 20  SpO2: 100% 100%      >>ED evaluation thus far shows: CBC shows white count of 4.8 hemoglobin stable at 8.9 platelets 328. CMP shows hypokalemia of 3.3 albumin  of 1.9 normal electrolytes and normal kidney function normal LFTs.  Assessment and Plan:  Rash with concern for drug reaction - Patient with many allergies and intolerances to medications  and antibiotics.  Had long frank discussion with patient, caregiver, and his son at bedside.  Had stated that patient has been taking ciprofloxacin  for a few weeks and only in the last couple days had a rash.  Initially thinking it may have shingles however this was ruled out.  Rash began to show improvement even prior to admission to the emergency department.  Upon evaluation this morning 10/18, rash is completely resolved.  Patient has no itchiness.  Discussed 1 of 2 frank outcomes for antibiotic: 1 being continue p.o. Cipro  and return home with close monitoring; the other being replacing PICC line and return to cefepime .  Dempsey discussion with the patient, caregiver, son all agree that her itchiness and symptoms were worse with cefepime  through the PICC line and they would all rather resume p.o. Cipro  and be discharged back home.  Patient states she is making great strides at home and does not want to be in the hospital.  Given resolution of her rash and symptoms resolved, I am hesitant to say if she has a drug allergy to Cipro  at this time.  Would recommend at this time patient resume her ciprofloxacin , return home with close monitoring for any ongoing reactions or rash, and follow-up with infectious disease Dr. Lindia in the outpatient clinic.  Would also recommend close discussion with infectious disease doctor in the outpatient setting if other reactions should occur.  Anemia of chronic disease/inflammation - Noted history of RA as well as ongoing osteomyelitis.  Hemoglobin stable, no active bleeding appreciated.  Hemoglobin  showed marked drop this morning, likely secondary to volume resuscitation.  Patient does not feel any dizziness.  Would recommend patient have hemoglobin redrawn in a week.  Chronic right hip osteomyelitis - S/p multiple surgeries, ongoing antibiotic therapy followed closely by infectious disease in the outpatient setting.  Recommend no change to current therapy and have patient  follow-up with infectious disease in the outpatient setting.  Rheumatoid arthritis - Continue home medication regimen.  Goals of care - Patient, caregiver, and his son all concur that patient would like to be discharged home, that they would not want to change her medication regimen, and that they would want to continue their therapy and follow-up with infectious disease in the outpatient setting.  At this time we will discharge patient home.  No change in medication regimen.    Consultants: None Procedures performed: None Disposition: Home Diet recommendation:  Discharge Diet Orders (From admission, onward)     Start     Ordered   03/12/24 0000  Diet - low sodium heart healthy        03/12/24 1134           Cardiac diet  DISCHARGE MEDICATION: Allergies as of 03/12/2024       Reactions   Darvon [propoxyphene Hcl] Shortness Of Breath   ? Dose Related ? Lowered respirations greatly   Demerol [meperidine] Shortness Of Breath, Other (See Comments)   Respiratory Distress   Penicillins Hives, Other (See Comments)   Has patient had a PCN reaction causing immediate rash, facial/tongue/throat swelling, SOB or lightheadedness with hypotension: No SEVERE RASH INVOLVING MUCUS MEMBRANES or SKIN NECROSIS: #  #  #  YES  #  #  #  Has patient had a PCN reaction that required hospitalization No Has patient had a PCN reaction occurring within the last 10 years: No.   Imuran [azathioprine] Other (See Comments)   Weakness   Sulfa  Antibiotics Nausea Only, Other (See Comments)   States had severe indigestion and heartburn and nausea and had to stop taking   Arava [leflunomide] Other (See Comments)   Excessive weight gain   Lipitor [atorvastatin ] Nausea And Vomiting   Currently taking 2 times weekly - able to tolerate low dose    Vytorin [ezetimibe-simvastatin] Nausea And Vomiting        Medication List     PAUSE taking these medications    [name]  15 MG Tb24 Wait to take this  until your doctor or other care provider tells you to start again. Resume when cleared by your doctors Generic drug: Upadacitinib  ER Take 15 mg by mouth daily.       TAKE these medications    acetaminophen  325 MG tablet Commonly known as: TYLENOL  Take 2 tablets (650 mg total) by mouth every 6 (six) hours as needed for mild pain (pain score 1-3) or fever (or Fever >/= 101).   acidophilus Caps capsule Take 2 capsules by mouth 3 (three) times daily.   atorvastatin  10 MG tablet Commonly known as: LIPITOR Take 1 tablet (10 mg total) by mouth every evening.   Biotin  5000 MCG Tabs Take 10,000 mcg by mouth in the morning.   bisacodyl  10 MG suppository Commonly known as: DULCOLAX Place 1 suppository (10 mg total) rectally daily as needed for moderate constipation.   cholecalciferol  25 MCG (1000 UNIT) tablet Commonly known as: VITAMIN D3 Take 1,000 Units by mouth in the morning.   ciprofloxacin  500 MG tablet Commonly known as: CIPRO  Take 1 tablet (500 mg total)  by mouth 2 (two) times daily.   cyclobenzaprine  10 MG tablet Commonly known as: FLEXERIL  TAKE 1 TABLET BY MOUTH THREE TIMES A DAY AS NEEDED FOR MUSCLE SPASM What changed: See the new instructions.   diazepam  5 MG tablet Commonly known as: VALIUM  TAKE 1 TABLET BY MOUTH AT BEDTIME AS NEEDED FOR MUSCLE SPASMS.   docusate sodium  100 MG capsule Commonly known as: COLACE Take 1 capsule (100 mg total) by mouth daily.   DULoxetine  20 MG capsule Commonly known as: CYMBALTA  TAKE 1 CAPSULE BY MOUTH AT BEDTIME.   folic acid  1 MG tablet Commonly known as: FOLVITE  Take 1 tablet (1 mg total) by mouth daily.   furosemide  20 MG tablet Commonly known as: LASIX  Take 0.5 tablets (10 mg total) by mouth daily.   Gerhardt's butt cream Crea Apply 1 Application topically 3 (three) times daily.   hydrOXYzine  10 MG tablet Commonly known as: ATARAX  Take 1 tablet (10 mg total) by mouth 3 (three) times daily as needed for itching.    LUBRICATING EYE DROPS OP Place 1 drop into both eyes daily as needed (dry eyes).   magic mouthwash (nystatin , lidocaine , diphenhydrAMINE , alum & mag hydroxide) suspension Swish and spit 5 mLs 3 (three) times daily as needed for mouth pain.   methotrexate  2.5 MG tablet Commonly known as: RHEUMATREX TAKE 5 TABLETS (12.5 MG TOTAL) BY MOUTH ONCE A WEEK. CAUTION:CHEMOTHERAPY. PROTECT FROM LIGHT.   oxyCODONE  5 MG immediate release tablet Commonly known as: Roxicodone  Take 1 tablet (5 mg total) by mouth every 4 (four) hours as needed for severe pain (pain score 7-10).   pantoprazole  40 MG tablet Commonly known as: PROTONIX  Take 1 tablet (40 mg total) by mouth 2 (two) times daily before a meal.   polyethylene glycol powder 17 GM/SCOOP powder Commonly known as: GLYCOLAX /MIRALAX  Take 17 g by mouth daily.   potassium chloride  SA 20 MEQ tablet Commonly known as: KLOR-CON  M TAKE 1 TABLET (20 MEQ TOTAL) BY MOUTH EVERY OTHER DAY.   predniSONE  5 MG tablet Commonly known as: DELTASONE  TAKE 1 TABLET BY MOUTH EVERY DAY IN THE MORNING   Prolia  60 MG/ML Sosy injection Generic drug: denosumab  Inject 60 mg into the skin every 6 (six) months. Deliver to rheum: [address], [address], [place], [place] [address]. Appt on 07/23/2022   senna 8.6 MG tablet Commonly known as: SENOKOT Take 1 tablet by mouth at bedtime.   sodium chloride  1 g tablet Take 1 tablet (1 g total) by mouth 2 (two) times daily with a meal.   tamsulosin  0.4 MG Caps capsule Commonly known as: FLOMAX  TAKE 1 CAPSULE BY MOUTH EVERY DAY AFTER SUPPER   vitamin C  1000 MG tablet Take 1 tablet (1,000 mg total) by mouth daily.   Zinc  Sulfate 220 (50 Zn) MG Tabs Take 1 tablet (220 mg total) by mouth daily with supper.               Discharge Care Instructions  (From admission, onward)           Start     Ordered   03/12/24 0000  Discharge wound care:       Comments: Resume previous regimen   03/12/24 1134              Discharge Exam: Filed Weights   03/12/24 0900  Weight: 46 kg    GENERAL:  Alert, pleasant, no acute distress, frail HEENT:  EOMI CARDIOVASCULAR:  RRR, no murmurs appreciated RESPIRATORY:  Clear to auscultation,  no wheezing, rales, or rhonchi GASTROINTESTINAL:  Soft, nontender, nondistended EXTREMITIES: Right hip brace intact NEURO:  No new focal deficits appreciated SKIN: Fresh dressing over chronic wound PSYCH:  Appropriate mood and affect     Condition at discharge: improving  The results of significant diagnostics from this hospitalization (including imaging, microbiology, ancillary and laboratory) are listed below for reference.   Imaging Studies: No results found.  Microbiology: Results for orders placed or performed during the hospital encounter of 03/11/24  Culture, blood (routine x 2)     Status: None (Preliminary result)   Collection Time: 03/11/24  2:58 PM   Specimen: BLOOD  Result Value Ref Range Status   Specimen Description BLOOD SITE NOT SPECIFIED  Final   Special Requests   Final    BOTTLES DRAWN AEROBIC AND ANAEROBIC Blood Culture results may not be optimal due to an inadequate volume of blood received in culture bottles   Culture   Final    NO GROWTH < 24 HOURS Performed at [facility], [address], [place], [place] [address]    Report Status PENDING  Incomplete  Culture, blood (routine x 2)     Status: None (Preliminary result)   Collection Time: 03/11/24  3:49 PM   Specimen: BLOOD  Result Value Ref Range Status   Specimen Description BLOOD SITE NOT SPECIFIED  Final   Special Requests   Final    BOTTLES DRAWN AEROBIC AND ANAEROBIC Blood Culture adequate volume   Culture   Final    NO GROWTH < 24 HOURS Performed at [facility], [address], [place], [place] [address]    Report Status PENDING  Incomplete   *Note: Due to a large number of results and/or encounters for the requested time period, some results have not  been displayed. A complete set of results can be found in Results Review.    Labs: CBC: Recent Labs  Lab 03/11/24 0915 03/12/24 0147  WBC 4.8 4.3  NEUTROABS 4.1  --   HGB 8.9* 7.1*  HCT 27.6* 21.6*  MCV 91.7 89.3  PLT 328 246   Basic Metabolic Panel: Recent Labs  Lab 03/11/24 0915 03/11/24 1810 03/12/24 0147  NA 135  --  133*  K 3.3*  --  3.2*  CL 97*  --  98  CO2 27  --  25  GLUCOSE 83  --  81  BUN 5*  --  <5*  CREATININE 0.68  --  0.67  CALCIUM  8.5*  --  7.8*  MG  --  1.3*  --    Liver Function Tests: Recent Labs  Lab 03/11/24 0915 03/12/24 0147  AST 21 16  ALT 13 10  ALKPHOS 64 52  BILITOT 0.6 0.6  PROT 5.5* 4.4*  ALBUMIN  1.9* <1.5*   CBG: No results for input(s): GLUCAP in the last 168 hours.  Discharge time spent: 40 minutes.  Length of inpatient stay: 0 days  Signed: Carliss LELON Canales, DO [facility] 03/12/2024

## 2024-03-15 ENCOUNTER — Other Ambulatory Visit: Payer: Self-pay | Admitting: Internal Medicine

## 2024-03-15 ENCOUNTER — Ambulatory Visit

## 2024-03-15 MED ORDER — NYSTATIN 100000 UNIT/ML MT SUSP: 5.0000 mL | Freq: Three times a day (TID) | OROMUCOSAL | 3 refills | Status: AC | PRN

## 2024-03-15 MED ORDER — DENOSUMAB 60 MG/ML ~~LOC~~ SOSY: 60.0000 mg | PREFILLED_SYRINGE | Freq: Once | SUBCUTANEOUS | Status: DC

## 2024-03-15 NOTE — Telephone Encounter (Unsigned)
 Copied from CRM #[id number]. Topic: Clinical - Medication Refill >> Mar 15, 2024  9:38 AM Ahlexyia S wrote: Medication: magic mouthwash (nystatin , lidocaine , diphenhydrAMINE , alum & mag hydroxide) suspension  Has the patient contacted their pharmacy? Yes, pharmacy called in for pt. (Agent: If no, request that the patient contact the pharmacy for the refill. If patient does not wish to contact the pharmacy document the reason why and proceed with request.) (Agent: If yes, when and what did the pharmacy advise?)  This is the patient's preferred pharmacy:   [facility] [place], [place] - [address] [place] [address] Phone: [phone] Fax: [phone]  Is this the correct pharmacy for this prescription? Yes If no, delete pharmacy and type the correct one.   Has the prescription been filled recently? No  Is the patient out of the medication? Yes  Has the patient been seen for an appointment in the last year OR does the patient have an upcoming appointment? Yes  Can we respond through MyChart? No  Agent: Please be advised that Rx refills may take up to 3 business days. We ask that you follow-up with your pharmacy.

## 2024-03-15 NOTE — Telephone Encounter (Unsigned)
 Copied from CRM [id number]. Topic: Clinical - Medication Refill >> Mar 15, 2024  9:39 AM Thersia C wrote: Medication: magic mouthwash (nystatin , lidocaine , diphenhydrAMINE , alum & mag hydroxide) suspension  Has the patient contacted their pharmacy? Yes (Agent: If no, request that the patient contact the pharmacy for the refill. If patient does not wish to contact the pharmacy document the reason why and proceed with request.) (Agent: If yes, when and what did the pharmacy advise?)  This is the patient's preferred pharmacy:   [facility] [place], [place] - [address] [place] [address] Phone: [phone] Fax: [phone]  Is this the correct pharmacy for this prescription? Yes If no, delete pharmacy and type the correct one.   Has the prescription been filled recently? No  Is the patient out of the medication? Yes  Has the patient been seen for an appointment in the last year OR does the patient have an upcoming appointment? Yes  Can we respond through MyChart? Yes  Agent: Please be advised that Rx refills may take up to 3 business days. We ask that you follow-up with your pharmacy.

## 2024-03-15 NOTE — Progress Notes (Deleted)
 Office Visit Note  Patient: Sherri [name]  A Elenbaas             Date of Birth: May 17, 1941           MRN: [id number]             PCP: [name] PARAS, MD Referring: [name] PARAS, MD Visit Date: 03/29/2024 Occupation: Data Unavailable  Subjective:  No chief complaint on file.   History of Present Illness: Sherri  [name] is a 83 y.o. female ***     Activities of Daily Living:  Patient reports morning stiffness for *** {minute/hour:19697}.   Patient {ACTIONS;DENIES/REPORTS:21021675::Denies} nocturnal pain.  Difficulty dressing/grooming: {ACTIONS;DENIES/REPORTS:21021675::Denies} Difficulty climbing stairs: {ACTIONS;DENIES/REPORTS:21021675::Denies} Difficulty getting out of chair: {ACTIONS;DENIES/REPORTS:21021675::Denies} Difficulty using hands for taps, buttons, cutlery, and/or writing: {ACTIONS;DENIES/REPORTS:21021675::Denies}  No Rheumatology ROS completed.   PMFS History:  Patient Active Problem List   Diagnosis Date Noted   Rash and nonspecific skin eruption 03/11/2024   Rash 03/10/2024   Drug reaction 03/10/2024   Peeling skin 03/10/2024   Prosthetic joint infection 02/15/2024   HOH (hard of hearing)    Foley catheter in place 02/03/2024   Prosthetic hip infection 01/25/2024   Pseudomonas aeruginosa infection 01/25/2024   Hip dislocation, right (HCC) 01/19/2024   Chronic pain 01/19/2024   Anemia of chronic disease 12/31/2023   Multiple skin tears 12/31/2023   Debility 12/08/2023   History of revision of total replacement of right hip joint 12/08/2023   Postoperative wound infection 12/08/2023   Closed fracture of right elbow 12/08/2023   Wound dehiscence 11/27/2023   Macrocytic anemia 11/27/2023   Cellulitis of right elbow 11/27/2023   Lightheadedness 11/10/2023   Physical deconditioning 11/10/2023   Parotid swelling 11/10/2023   Infected laceration of skin 11/10/2023   Skin tear of elbow without complication, right, initial encounter 11/10/2023   Hip  fracture (HCC) 11/10/2023   Pressure injury of skin 10/03/2023   Hyponatremia 10/02/2023   Fall at home, initial encounter 10/02/2023   Cellulitis of right lower extremity 10/02/2023   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 12/24/2022   Cervical spondylosis with myelopathy and radiculopathy 04/23/2022   Constipation 03/04/2022   Upper back pain on left side 03/04/2022   Lumbar vertebral fracture, pathologic 09/20/2021   Degenerative spondylolisthesis 08/19/2021   Senile purpura 12/19/2020   Restless leg 02/02/2020   Bilateral leg edema 01/11/2020   Cervical radiculopathy at C8 06/28/2019   Carpal tunnel syndrome on both sides 06/28/2019   Right sided sciatica 04/14/2018   Trochanteric bursitis of right hip 04/14/2018   H/O cervical spine surgery 11/10/2017   Dysphagia 10/26/2017   GERD (gastroesophageal reflux disease) 08/06/2017   Easy bruising 08/06/2017   Mid back pain 03/26/2017   Submandibular gland swelling 01/08/2017   High risk medication use 09/02/2016   Piriformis muscle pain 09/02/2016   Right hip pain 04/08/2016   Right knee pain 02/25/2016   Numbness and tingling in left hand 11/14/2014   Biceps tendon tear 10/10/2014   Insomnia 10/10/2014   Gait disturbance 06/13/2014   OP (osteoporosis) 01/18/2014   Other long term (current) drug therapy 01/18/2014   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 01/01/2013   History of colonic polyps 07/03/2010   Nocturia 05/28/2010   THYROID  NODULE 05/17/2008   Multiple sclerosis 05/17/2008   Other fatigue 03/30/2007   Hyperlipidemia 08/20/2006   Essential hypertension 08/20/2006   Rheumatoid arthritis (HCC) 08/20/2006   Osteoarthritis 08/20/2006   CERVICAL CANCER, HX OF 08/20/2006   GASTRIC ULCER,  HX OF 08/20/2006    Past Medical History:  Diagnosis Date   Anxiety    takes Valium  daily as needed   Basal cell carcinoma 01/21/1988   left nostril (MOHS), sup-right calf (CX35FU)   Basal cell carcinoma 03/22/1996    right calf (CX35FU)   Basal cell carcinoma 03/31/1995   left wing nose   Bruises easily    d/t meds   Cancer (HCC)    basal cell ca, in situ- uterine    Cataracts, bilateral    removed bilateral   Chronic back pain    Dizziness    r/t to meds   GERD (gastroesophageal reflux disease)    no meds on a regular basis but will take Tums if needed   Headache(784.0)    r/t neck issues   History of bronchitis 6-55yrs ago   History of colon polyps    benign   HOH (hard of hearing)    wears hearing aids   HOH (hard of hearing)    wears hearing aids    Hyperlipidemia    takes Atorvastatin  on Mondays and Fridays   Hypertension    Joint pain    Joint swelling    Multiple sclerosis    Neuromuscular disorder (HCC)    Dr. FABIENE Crete- [facility], follows [facility]   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   Rheumatoid arthritis (HCC)    Dr Lorrayne weekly, RA- hands- knees- feet    Rheumatoid arthritis(714.0)    Dr Dolphus takes Xeljanz  daily   Right wrist fracture    Skin cancer    Squamous cell carcinoma of skin 11/02/2001   in situ-right knee (cx84fu)   Squamous cell carcinoma of skin 11/12/2011   in situ-left forearm (CX35FU), in situ-left foot (CX35FU)   Squamous cell carcinoma of skin 01/22/2012   in situ-left lower forearm (txpbx)   Squamous cell carcinoma of skin 11/17/2012   right shin (txpbx)   Squamous cell carcinoma of skin 10/28/2013   in situ-right shoulder (CX35FU)   Squamous cell carcinoma of skin 04/28/2014   well diff-right shin (txpbx)   Squamous cell carcinoma of skin 11/02/2014   in situ-Left shin (txpbx)   Squamous cell carcinoma of skin 12/15/2014   in situ-Left hand (txpbx), bowens-left side chest (txpbx)   Squamous cell carcinoma of skin 05/09/2015   in situ-left hand (txpbx)    Squamous cell  carcinoma of skin 05/07/2016   in situ-left inner shin,ant, in situ-left inner shin, post, in situ-left outer forearm, in situ-right knuckle   Squamous cell carcinoma of skin 03/1302018   in situ-left shoulder (txpbx), in situ-right inner shin (txpbx), in situ-top of left foot (txpbx), KA- left forearm (txpbx)   Squamous cell carcinoma of skin 12/02/2016   in situ-left inner shin (txpbx)   Squamous cell carcinoma of skin 03/04/2017   in situ-left outer sup, shin (txpbx), in situ- right 2nd knuckle finger (txpbx), in situ- Left wrist (txpbx)   Squamous cell carcinoma of skin 04/06/2018   in situ-above left knee inner (txpbx)   Squamous cell carcinoma of skin 04/21/2018   in situ-right top hand (txpbx)   Squamous cell carcinoma of skin 07/08/2018   in situ-right lower inner shin (txpbx)   Squamous cell carcinoma of skin 04/27/2019   in situ- Left neck(CX35FU), In situ- right neck (CX35FU)   Urinary  retention    sees Dr.Wrenn about 2 times a yr    Family History  Problem Relation Age of Onset   Cancer Mother        pancreatic   Diabetes Mother    Pancreatic cancer Mother    Heart disease Father        Rheumatic   Cancer Father        ? stomach   Stomach cancer Father    Cancer Sister        stomach   Stomach cancer Sister    Cancer Maternal Aunt        X 4; ? primary   Heart disease Paternal Aunt    Cancer Maternal Grandmother        cervical   Colon cancer Other        Aunts   Multiple sclerosis Daughter    Hypertension Neg Hx    Stroke Neg Hx    Esophageal cancer Neg Hx    Rectal cancer Neg Hx    Past Surgical History:  Procedure Laterality Date   ABDOMINAL HYSTERECTOMY     1985   ANTERIOR CERVICAL DECOMP/DISCECTOMY FUSION N/A 04/23/2022   Procedure: Anterior Cervical Decompression/Discectomy Fusion - Cervical Seven-Thoracic One;  Surgeon: Louis Shove, MD;  Location: [facility];  Service: Neurosurgery;  Laterality: N/A;  3C   ANTERIOR HIP REVISION Right 01/21/2024    Procedure: REVISION, TOTAL ARTHROPLASTY, HIP, ANTERIOR APPROACH;  Surgeon: Fidel Rogue, MD;  Location: [facility];  Service: Orthopedics;  Laterality: Right;   APPENDECTOMY     with TAH   BACK SURGERY     several   BREAST BIOPSY Right    Results were negative   BROW LIFT Bilateral 10/02/2016   Procedure: BILATERAL LOWER LID BLEPHAROPLASTY;  Surgeon: Laurie Loyd Redhead, MD;  Location: [facility];  Service: Plastics;  Laterality: Bilateral;   CARPAL TUNNEL RELEASE Right    cataracts     CERVICAL FUSION     Dr Leeann   CERVICAL FUSION  12/31/2012   Dr Leeann   CERVICAL FUSION  04/2022   CHEST TUBE INSERTION     for traumatic Pneumothorax   COLONOSCOPY  07/16/2010   normal    COLONOSCOPY W/ POLYPECTOMY  1997   negative since; Dr Debrah   ESOPHAGOGASTRODUODENOSCOPY  07/16/2010   normal   eye lid raise     EYE SURGERY Bilateral    cataracts removed - /w IOL   HARDWARE REMOVAL  08/2021   HIP CLOSED REDUCTION Right 11/16/2023   Procedure: CLOSED MANIPULATION, JOINT, HIP;  Surgeon: Ernie Cough, MD;  Location: [facility];  Service: Orthopedics;  Laterality: Right;   HIP CLOSED REDUCTION N/A 11/30/2023   Procedure: CLOSED REDUCTION, HIP;  Surgeon: Fidel Rogue, MD;  Location: [facility];  Service: Orthopedics;  Laterality: N/A;   INCISION AND DRAINAGE OF WOUND Right 11/28/2023   Procedure: IRRIGATION AND DEBRIDEMENT WOUND; REMOVAL OF HARDWARE;  Surgeon: Edna Toribio LABOR, MD;  Location: [facility];  Service: Orthopedics;  Laterality: Right;  I & D RIGHT ELBOW   JOINT REPLACEMENT     LUMBAR FUSION     Dr Leeann   NASAL SINUS SURGERY     ORIF ELBOW FRACTURE Right 11/20/2023   Procedure: OPEN REDUCTION INTERNAL FIXATION (ORIF) ELBOW/OLECRANON FRACTURE;  Surgeon: [name] Franky SQUIBB, MD;  Location: [facility];  Service: Orthopedics;  Laterality: Right;   POSTERIOR CERVICAL FUSION/FORAMINOTOMY N/A 12/31/2012   Procedure: POSTERIOR LATERAL CERVICAL FUSION/FORAMINOTOMY LEVEL 1 CERVICAL THREE-FOUR WITH LATERAL  MASS  SCREWS;  Surgeon: Catalina CHRISTELLA Stains, MD;  Location: [facility];  Service: Neurosurgery;  Laterality: N/A;   PTOSIS REPAIR Bilateral 10/02/2016   Procedure: INTERNAL PTOSIS REPAIR;  Surgeon: Laurie Loyd Redhead, MD;  Location: [facility];  Service: Plastics;  Laterality: Bilateral;   SKIN BIOPSY     THYROID  SURGERY     R lobe removed- 1972, has grown back - CT last done- 2018   TOTAL HIP ARTHROPLASTY  12/22/2011   Procedure: TOTAL HIP ARTHROPLASTY;  Surgeon: Dempsey JINNY Sensor, MD;  Location: [facility];  Service: Orthopedics;  Laterality: Left;   TOTAL HIP ARTHROPLASTY Right 11/12/2023   Procedure: ARTHROPLASTY, HIP, TOTAL, ANTERIOR APPROACH;  Surgeon: Fidel Rogue, MD;  Location: [facility];  Service: Orthopedics;  Laterality: Right;   TOTAL HIP REVISION Right 12/03/2023   Procedure: TOTAL HIP REVISION;  Surgeon: Fidel Rogue, MD;  Location: [facility];  Service: Orthopedics;  Laterality: Right;  Right acetabular revision Anterior approach   TOTAL SHOULDER ARTHROPLASTY     TUBAL LIGATION     UPPER GASTROINTESTINAL ENDOSCOPY  2012   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   WRIST SURGERY Left 09/16/2022   wrist/arm   Social History   Tobacco Use   Smoking status: Former    Current packs/day: 0.00    Average packs/day: 2.5 packs/day for 20.0 years (50.0 ttl pk-yrs)    Types: Cigarettes    Start date: 05/26/1961    Quit date: 05/26/1981    Years since quitting: 42.8    Passive exposure: Never   Smokeless tobacco: Never   Tobacco comments:    Smoked [id number], up to 2.5 ppd  Vaping Use   Vaping status: Never Used  Substance Use Topics   Alcohol  use: Yes    Alcohol /week: 14.0 standard drinks of alcohol     Types: 14 Cans of beer per week    Comment: 2% beer   Drug use: Never   Social History   Social History Narrative   Not on file     Immunization History  Administered Date(s) Administered   Fluad Quad(high Dose 65+) 02/02/2020, 02/01/2022   INFLUENZA, HIGH DOSE SEASONAL PF 02/04/2015,  02/18/2017, 02/21/2018, 01/16/2019, 01/25/2021, 02/02/2023   Influenza Whole 02/22/2007, 02/08/2013   Influenza-Unspecified 01/24/2014, 01/25/2016, 02/18/2017, 01/16/2019, 01/25/2020   Moderna Covid-19 Fall Seasonal Vaccine 68yrs & older 02/02/2023, 09/07/2023   Moderna Covid-19 Vaccine Bivalent Booster 66yrs & up 02/11/2022   PFIZER(Purple Top)SARS-COV-2 Vaccination 06/18/2019, 07/09/2019, 01/09/2020, 07/03/2020, 02/07/2021   PNEUMOCOCCAL CONJUGATE-20 02/26/2022   Pfizer Covid-19 Vaccine Bivalent Booster 31yrs & up 02/08/2021   Pneumococcal Conjugate-13 11/23/2012, 09/08/2014   Pneumococcal Polysaccharide-23 05/26/2006   Pneumococcal-Unspecified 12/28/2012, 06/09/2020   Respiratory Syncytial Virus Vaccine,Recomb Aduvanted(Arexvy) 01/23/2022   Td 05/28/2010   Tdap 12/24/2020, 01/07/2021   Unspecified SARS-COV-2 Vaccination 06/26/2020, 02/11/2022   Varicella 11/07/2020   Zoster Recombinant(Shingrix) 02/10/2019, 04/14/2019   Zoster, Live 05/26/2009, 07/10/2020     Objective: Vital Signs: There were no vitals taken for this visit.   Physical Exam   Musculoskeletal Exam: ***  CDAI Exam: CDAI Score: -- Patient Global: --; Provider Global: -- Swollen: --; Tender: -- Joint Exam 03/29/2024   No joint exam has been documented for this visit   There is currently no information documented on the homunculus. Go to the Rheumatology activity and complete the homunculus joint exam.  Investigation: No additional findings.  Imaging: No results found.  Recent Labs: Lab Results  Component Value Date   WBC 4.3 03/12/2024  HGB 7.1 (L) 03/12/2024   PLT 246 03/12/2024   NA 133 (L) 03/12/2024   K 3.2 (L) 03/12/2024   CL 98 03/12/2024   CO2 25 03/12/2024   GLUCOSE 81 03/12/2024   BUN <5 (L) 03/12/2024   CREATININE 0.67 03/12/2024   BILITOT 0.6 03/12/2024   ALKPHOS 52 03/12/2024   AST 16 03/12/2024   ALT 10 03/12/2024   PROT 4.4 (L) 03/12/2024   ALBUMIN  <1.5 (L) 03/12/2024    CALCIUM  7.8 (L) 03/12/2024   GFRAA 65 09/26/2020   QFTBGOLDPLUS NEGATIVE 11/03/2022    Speciality Comments: Prior therapy includes: Xeljanz  (cost), Olumiant -inadequate response, Imuran weakness, leflunomide-intolerance Prolia : 05/04/18, 11/12/18, 06/09/19, 12/07/19, 07/05/20, 01/01/21, 07/09/21, 01/09/22, 07/24/22  Procedures:  No procedures performed Allergies: Darvon [propoxyphene hcl], Demerol [meperidine], Penicillins, Imuran [azathioprine], Sulfa  antibiotics, Arava [leflunomide], Lipitor [atorvastatin ], and Vytorin [ezetimibe-simvastatin]   Assessment / Plan:     Visit Diagnoses: No diagnosis found.  Orders: No orders of the defined types were placed in this encounter.  No orders of the defined types were placed in this encounter.   Face-to-face time spent with patient was *** minutes. Greater than 50% of time was spent in counseling and coordination of care.  Follow-Up Instructions: No follow-ups on file.   Daved JAYSON Gavel, CMA  Note - This record has been created using Animal nutritionist.  Chart creation errors have been sought, but may not always  have been located. Such creation errors do not reflect on  the standard of medical care.

## 2024-03-16 ENCOUNTER — Encounter: Payer: Self-pay | Admitting: Rheumatology

## 2024-03-16 ENCOUNTER — Ambulatory Visit

## 2024-03-16 LAB — CULTURE, BLOOD (ROUTINE X 2)
Culture: NO GROWTH
Culture: NO GROWTH
Special Requests: ADEQUATE

## 2024-03-17 ENCOUNTER — Telehealth: Payer: Self-pay

## 2024-03-17 ENCOUNTER — Ambulatory Visit (INDEPENDENT_AMBULATORY_CARE_PROVIDER_SITE_OTHER): Admitting: *Deleted

## 2024-03-17 VITALS — BP 125/76 | HR 112 | Temp 98.4°F | Resp 16 | Ht 64.0 in | Wt 102.6 lb

## 2024-03-17 DIAGNOSIS — M810 Age-related osteoporosis without current pathological fracture: Secondary | ICD-10-CM | POA: Diagnosis not present

## 2024-03-17 DIAGNOSIS — Z9889 Other specified postprocedural states: Secondary | ICD-10-CM | POA: Diagnosis not present

## 2024-03-17 DIAGNOSIS — M4312 Spondylolisthesis, cervical region: Secondary | ICD-10-CM | POA: Diagnosis not present

## 2024-03-17 MED ORDER — DENOSUMAB 60 MG/ML ~~LOC~~ SOSY
60.0000 mg | PREFILLED_SYRINGE | Freq: Once | SUBCUTANEOUS | Status: AC
  Administered 2024-03-17: 60 mg via SUBCUTANEOUS
  Filled 2024-03-17: qty 1

## 2024-03-17 NOTE — Progress Notes (Signed)
 Diagnosis: Osteoporosis  Provider:  Chilton Greathouse MD  Procedure: Injection  Prolia (Denosumab), Dose: 60 mg, Site: subcutaneous, Number of injections: 1  Injection Site(s): Left arm  Post Care: Observation period completed  Discharge: Condition: Good, Destination: Home . AVS Provided  Performed by:  Forrest Moron, RN

## 2024-03-17 NOTE — Telephone Encounter (Signed)
 Copied from CRM [id number]. Topic: General - Other >> Mar 17, 2024  4:18 PM [place] J wrote: Reason for CRM:  Caller/Agency: Dagoberto Cal Corean Salome Mitch Number: [phone]  Patient concerns: Patient is complaining of her mouth being very sore inside. There were no blisters, bumps, or sores to be found. She is using magic mouthwash and has been instructed to use it 3x a day.

## 2024-03-17 NOTE — Telephone Encounter (Signed)
 Message was left for patient to return call to clinic

## 2024-03-18 NOTE — Telephone Encounter (Signed)
 Noted

## 2024-03-22 ENCOUNTER — Inpatient Hospital Stay (HOSPITAL_COMMUNITY)
Admission: EM | Admit: 2024-03-22 | Discharge: 2024-03-29 | DRG: 606 | Disposition: A | Discharge status: Home Health | Attending: Internal Medicine | Admitting: Internal Medicine

## 2024-03-22 ENCOUNTER — Emergency Department (HOSPITAL_COMMUNITY)

## 2024-03-22 ENCOUNTER — Telehealth: Payer: Self-pay

## 2024-03-22 ENCOUNTER — Other Ambulatory Visit: Payer: Self-pay

## 2024-03-22 DIAGNOSIS — I2489 Other forms of acute ischemic heart disease: Secondary | ICD-10-CM | POA: Diagnosis present

## 2024-03-22 DIAGNOSIS — E785 Hyperlipidemia, unspecified: Secondary | ICD-10-CM | POA: Diagnosis present

## 2024-03-22 DIAGNOSIS — L89153 Pressure ulcer of sacral region, stage 3: Secondary | ICD-10-CM | POA: Diagnosis not present

## 2024-03-22 DIAGNOSIS — Z7401 Bed confinement status: Secondary | ICD-10-CM

## 2024-03-22 DIAGNOSIS — T368X5A Adverse effect of other systemic antibiotics, initial encounter: Secondary | ICD-10-CM | POA: Diagnosis present

## 2024-03-22 DIAGNOSIS — E43 Unspecified severe protein-calorie malnutrition: Secondary | ICD-10-CM | POA: Diagnosis present

## 2024-03-22 DIAGNOSIS — E871 Hypo-osmolality and hyponatremia: Secondary | ICD-10-CM | POA: Diagnosis not present

## 2024-03-22 DIAGNOSIS — D61818 Other pancytopenia: Secondary | ICD-10-CM | POA: Diagnosis not present

## 2024-03-22 DIAGNOSIS — E876 Hypokalemia: Secondary | ICD-10-CM | POA: Diagnosis not present

## 2024-03-22 DIAGNOSIS — Z681 Body mass index (BMI) 19 or less, adult: Secondary | ICD-10-CM

## 2024-03-22 DIAGNOSIS — S00522A Blister (nonthermal) of oral cavity, initial encounter: Secondary | ICD-10-CM | POA: Diagnosis present

## 2024-03-22 DIAGNOSIS — Z888 Allergy status to other drugs, medicaments and biological substances status: Secondary | ICD-10-CM

## 2024-03-22 DIAGNOSIS — Z885 Allergy status to narcotic agent status: Secondary | ICD-10-CM

## 2024-03-22 DIAGNOSIS — Z833 Family history of diabetes mellitus: Secondary | ICD-10-CM

## 2024-03-22 DIAGNOSIS — L100 Pemphigus vulgaris: Secondary | ICD-10-CM | POA: Diagnosis present

## 2024-03-22 DIAGNOSIS — G8929 Other chronic pain: Secondary | ICD-10-CM | POA: Diagnosis present

## 2024-03-22 DIAGNOSIS — T8451XD Infection and inflammatory reaction due to internal right hip prosthesis, subsequent encounter: Secondary | ICD-10-CM | POA: Diagnosis not present

## 2024-03-22 DIAGNOSIS — D649 Anemia, unspecified: Secondary | ICD-10-CM

## 2024-03-22 DIAGNOSIS — M549 Dorsalgia, unspecified: Secondary | ICD-10-CM | POA: Diagnosis present

## 2024-03-22 DIAGNOSIS — L270 Generalized skin eruption due to drugs and medicaments taken internally: Principal | ICD-10-CM | POA: Diagnosis present

## 2024-03-22 DIAGNOSIS — Z1152 Encounter for screening for COVID-19: Secondary | ICD-10-CM

## 2024-03-22 DIAGNOSIS — Z880 Allergy status to penicillin: Secondary | ICD-10-CM

## 2024-03-22 DIAGNOSIS — K7689 Other specified diseases of liver: Secondary | ICD-10-CM | POA: Diagnosis present

## 2024-03-22 DIAGNOSIS — H9193 Unspecified hearing loss, bilateral: Secondary | ICD-10-CM | POA: Diagnosis present

## 2024-03-22 DIAGNOSIS — I10 Essential (primary) hypertension: Secondary | ICD-10-CM | POA: Diagnosis present

## 2024-03-22 DIAGNOSIS — Z96612 Presence of left artificial shoulder joint: Secondary | ICD-10-CM | POA: Diagnosis present

## 2024-03-22 DIAGNOSIS — L89323 Pressure ulcer of left buttock, stage 3: Secondary | ICD-10-CM | POA: Diagnosis present

## 2024-03-22 DIAGNOSIS — Z79620 Long term (current) use of immunosuppressive biologic: Secondary | ICD-10-CM

## 2024-03-22 DIAGNOSIS — K219 Gastro-esophageal reflux disease without esophagitis: Secondary | ICD-10-CM | POA: Diagnosis present

## 2024-03-22 DIAGNOSIS — K121 Other forms of stomatitis: Secondary | ICD-10-CM | POA: Diagnosis present

## 2024-03-22 DIAGNOSIS — R059 Cough, unspecified: Secondary | ICD-10-CM | POA: Diagnosis not present

## 2024-03-22 DIAGNOSIS — Z800 Family history of malignant neoplasm of digestive organs: Secondary | ICD-10-CM

## 2024-03-22 DIAGNOSIS — Z981 Arthrodesis status: Secondary | ICD-10-CM

## 2024-03-22 DIAGNOSIS — D638 Anemia in other chronic diseases classified elsewhere: Secondary | ICD-10-CM | POA: Diagnosis present

## 2024-03-22 DIAGNOSIS — Z79899 Other long term (current) drug therapy: Secondary | ICD-10-CM

## 2024-03-22 DIAGNOSIS — Z882 Allergy status to sulfonamides status: Secondary | ICD-10-CM

## 2024-03-22 DIAGNOSIS — Z96641 Presence of right artificial hip joint: Secondary | ICD-10-CM | POA: Diagnosis present

## 2024-03-22 DIAGNOSIS — T8451XA Infection and inflammatory reaction due to internal right hip prosthesis, initial encounter: Secondary | ICD-10-CM | POA: Diagnosis not present

## 2024-03-22 DIAGNOSIS — Z820 Family history of epilepsy and other diseases of the nervous system: Secondary | ICD-10-CM

## 2024-03-22 DIAGNOSIS — F419 Anxiety disorder, unspecified: Secondary | ICD-10-CM | POA: Diagnosis present

## 2024-03-22 DIAGNOSIS — Z85828 Personal history of other malignant neoplasm of skin: Secondary | ICD-10-CM

## 2024-03-22 DIAGNOSIS — Z860100 Personal history of colon polyps, unspecified: Secondary | ICD-10-CM

## 2024-03-22 DIAGNOSIS — M069 Rheumatoid arthritis, unspecified: Secondary | ICD-10-CM | POA: Diagnosis present

## 2024-03-22 DIAGNOSIS — Z8249 Family history of ischemic heart disease and other diseases of the circulatory system: Secondary | ICD-10-CM

## 2024-03-22 DIAGNOSIS — Z723 Lack of physical exercise: Secondary | ICD-10-CM

## 2024-03-22 DIAGNOSIS — G35D Multiple sclerosis, unspecified: Secondary | ICD-10-CM | POA: Diagnosis not present

## 2024-03-22 DIAGNOSIS — Z881 Allergy status to other antibiotic agents status: Secondary | ICD-10-CM

## 2024-03-22 DIAGNOSIS — M86651 Other chronic osteomyelitis, right thigh: Secondary | ICD-10-CM | POA: Diagnosis present

## 2024-03-22 DIAGNOSIS — Z90710 Acquired absence of both cervix and uterus: Secondary | ICD-10-CM

## 2024-03-22 DIAGNOSIS — Z87891 Personal history of nicotine dependence: Secondary | ICD-10-CM

## 2024-03-22 DIAGNOSIS — Z8619 Personal history of other infectious and parasitic diseases: Secondary | ICD-10-CM

## 2024-03-22 DIAGNOSIS — R21 Rash and other nonspecific skin eruption: Secondary | ICD-10-CM | POA: Diagnosis not present

## 2024-03-22 DIAGNOSIS — Z974 Presence of external hearing-aid: Secondary | ICD-10-CM

## 2024-03-22 LAB — CBC WITH DIFFERENTIAL/PLATELET
Abs Immature Granulocytes: 0.03 K/uL (ref 0.00–0.07)
Basophils Absolute: 0 K/uL (ref 0.0–0.1)
Basophils Relative: 0 %
Eosinophils Absolute: 0 K/uL (ref 0.0–0.5)
Eosinophils Relative: 1 %
HCT: 20.3 % — ABNORMAL LOW (ref 36.0–46.0)
Hemoglobin: 6.4 g/dL — CL (ref 12.0–15.0)
Immature Granulocytes: 1 %
Lymphocytes Relative: 23 %
Lymphs Abs: 0.5 K/uL — ABNORMAL LOW (ref 0.7–4.0)
MCH: 29.2 pg (ref 26.0–34.0)
MCHC: 31.5 g/dL (ref 30.0–36.0)
MCV: 92.7 fL (ref 80.0–100.0)
Monocytes Absolute: 0.1 K/uL (ref 0.1–1.0)
Monocytes Relative: 5 %
Neutro Abs: 1.6 K/uL — ABNORMAL LOW (ref 1.7–7.7)
Neutrophils Relative %: 70 %
Platelets: 55 K/uL — ABNORMAL LOW (ref 150–400)
RBC: 2.19 MIL/uL — ABNORMAL LOW (ref 3.87–5.11)
RDW: 21.5 % — ABNORMAL HIGH (ref 11.5–15.5)
WBC: 2.3 K/uL — ABNORMAL LOW (ref 4.0–10.5)
nRBC: 0 % (ref 0.0–0.2)

## 2024-03-22 LAB — IRON AND TIBC
Iron: 31 ug/dL (ref 28–170)
Saturation Ratios: 22 % (ref 10.4–31.8)
TIBC: 140 ug/dL — ABNORMAL LOW (ref 250–450)
UIBC: 109 ug/dL

## 2024-03-22 LAB — RESP PANEL BY RT-PCR (RSV, FLU A&B, COVID)  RVPGX2
Influenza A by PCR: NEGATIVE
Influenza B by PCR: NEGATIVE
Resp Syncytial Virus by PCR: NEGATIVE
SARS Coronavirus 2 by RT PCR: NEGATIVE

## 2024-03-22 LAB — BASIC METABOLIC PANEL WITH GFR
Anion gap: 9 (ref 5–15)
BUN: <5 mg/dL — ABNORMAL LOW (ref 8–23)
CO2: 26 mmol/L (ref 22–32)
Calcium: 7.1 mg/dL — ABNORMAL LOW (ref 8.9–10.3)
Chloride: 99 mmol/L (ref 98–111)
Creatinine, Ser: 0.53 mg/dL (ref 0.44–1.00)
GFR, Estimated: >60 mL/min (ref >60)
Glucose, Bld: 77 mg/dL (ref 70–99)
Potassium: 3.1 mmol/L — ABNORMAL LOW (ref 3.5–5.1)
Sodium: 135 mmol/L (ref 135–145)

## 2024-03-22 LAB — URINALYSIS, W/ REFLEX TO CULTURE (INFECTION SUSPECTED)
Bilirubin Urine: NEGATIVE
Glucose, UA: NEGATIVE mg/dL
Hgb urine dipstick: NEGATIVE
Ketones, ur: 5 mg/dL — AB
Leukocytes,Ua: NEGATIVE
Nitrite: NEGATIVE
Protein, ur: NEGATIVE mg/dL
Specific Gravity, Urine: 1.008 (ref 1.005–1.030)
pH: 8 (ref 5.0–8.0)

## 2024-03-22 LAB — HEMOGLOBIN AND HEMATOCRIT, BLOOD
HCT: 27.1 % — ABNORMAL LOW (ref 36.0–46.0)
Hemoglobin: 8.8 g/dL — ABNORMAL LOW (ref 12.0–15.0)

## 2024-03-22 LAB — RETICULOCYTES
Immature Retic Fract: 30.9 % — ABNORMAL HIGH (ref 2.3–15.9)
RBC.: 3.49 MIL/uL — ABNORMAL LOW (ref 3.87–5.11)
Retic Count, Absolute: 54.1 K/uL (ref 19.0–186.0)
Retic Ct Pct: 1.6 % (ref 0.4–3.1)

## 2024-03-22 LAB — I-STAT CHEM 8, ED
BUN: <3 mg/dL — ABNORMAL LOW (ref 8–23)
Calcium, Ion: 1.02 mmol/L — ABNORMAL LOW (ref 1.15–1.40)
Chloride: 96 mmol/L — ABNORMAL LOW (ref 98–111)
Creatinine, Ser: 0.5 mg/dL (ref 0.44–1.00)
Glucose, Bld: 75 mg/dL (ref 70–99)
HCT: 26 % — ABNORMAL LOW (ref 36.0–46.0)
Hemoglobin: 8.8 g/dL — ABNORMAL LOW (ref 12.0–15.0)
Potassium: 3 mmol/L — ABNORMAL LOW (ref 3.5–5.1)
Sodium: 134 mmol/L — ABNORMAL LOW (ref 135–145)
TCO2: 24 mmol/L (ref 22–32)

## 2024-03-22 LAB — HEPATIC FUNCTION PANEL
ALT: 8 U/L (ref 0–44)
AST: 22 U/L (ref 15–41)
Albumin: 2.5 g/dL — ABNORMAL LOW (ref 3.5–5.0)
Alkaline Phosphatase: 51 U/L (ref 38–126)
Bilirubin, Direct: 0.2 mg/dL (ref 0.0–0.2)
Indirect Bilirubin: 0.2 mg/dL — ABNORMAL LOW (ref 0.3–0.9)
Total Bilirubin: 0.4 mg/dL (ref 0.0–1.2)
Total Protein: 4.9 g/dL — ABNORMAL LOW (ref 6.5–8.1)

## 2024-03-22 LAB — VITAMIN B12: Vitamin B-12: 655 pg/mL (ref 180–914)

## 2024-03-22 LAB — TROPONIN T, HIGH SENSITIVITY
Troponin T High Sensitivity: 70 ng/L — ABNORMAL HIGH (ref 0–19)
Troponin T High Sensitivity: 68 ng/L — ABNORMAL HIGH (ref 0–19)

## 2024-03-22 LAB — LIPASE, BLOOD: Lipase: 13 U/L (ref 11–51)

## 2024-03-22 LAB — FOLATE: Folate: 12.5 ng/mL (ref >5.9)

## 2024-03-22 LAB — I-STAT CG4 LACTIC ACID, ED: Lactic Acid, Venous: 0.8 mmol/L (ref 0.5–1.9)

## 2024-03-22 LAB — FERRITIN: Ferritin: 704 ng/mL — ABNORMAL HIGH (ref 11–307)

## 2024-03-22 LAB — PREPARE RBC (CROSSMATCH)

## 2024-03-22 MED ORDER — ONDANSETRON HCL 4 MG PO TABS: 4.0000 mg | ORAL_TABLET | Freq: Four times a day (QID) | ORAL | Status: DC | PRN

## 2024-03-22 MED ORDER — CIPROFLOXACIN HCL 500 MG PO TABS: 500.0000 mg | ORAL_TABLET | Freq: Two times a day (BID) | ORAL | Status: DC

## 2024-03-22 MED ORDER — ALBUTEROL SULFATE (2.5 MG/3ML) 0.083% IN NEBU: 2.5000 mg | INHALATION_SOLUTION | RESPIRATORY_TRACT | Status: DC | PRN

## 2024-03-22 MED ORDER — RISAQUAD PO CAPS
2.0000 | ORAL_CAPSULE | Freq: Three times a day (TID) | ORAL | Status: DC
  Administered 2024-03-22 – 2024-03-29 (×19): 2 via ORAL
  Filled 2024-03-22 (×19): qty 2

## 2024-03-22 MED ORDER — TRAZODONE HCL 50 MG PO TABS
25.0000 mg | ORAL_TABLET | Freq: Every evening | ORAL | Status: DC | PRN
  Administered 2024-03-22 – 2024-03-28 (×7): 25 mg via ORAL
  Filled 2024-03-22 (×7): qty 1

## 2024-03-22 MED ORDER — FUROSEMIDE 20 MG PO TABS: 10.0000 mg | ORAL_TABLET | Freq: Every day | ORAL | Status: DC | PRN

## 2024-03-22 MED ORDER — METHYLPREDNISOLONE SODIUM SUCC 40 MG IJ SOLR: 40.0000 mg | Freq: Once | INTRAMUSCULAR | Status: DC

## 2024-03-22 MED ORDER — PANTOPRAZOLE SODIUM 40 MG PO TBEC
40.0000 mg | DELAYED_RELEASE_TABLET | Freq: Two times a day (BID) | ORAL | Status: DC
  Administered 2024-03-23 – 2024-03-29 (×13): 40 mg via ORAL
  Filled 2024-03-22 (×13): qty 1

## 2024-03-22 MED ORDER — POTASSIUM CHLORIDE CRYS ER 20 MEQ PO TBCR
40.0000 meq | EXTENDED_RELEASE_TABLET | Freq: Once | ORAL | Status: DC
  Filled 2024-03-22: qty 2

## 2024-03-22 MED ORDER — ATORVASTATIN CALCIUM 10 MG PO TABS: 10.0000 mg | ORAL_TABLET | Freq: Every evening | ORAL | Status: DC

## 2024-03-22 MED ORDER — ACETAMINOPHEN 650 MG RE SUPP: 650.0000 mg | Freq: Four times a day (QID) | RECTAL | Status: DC | PRN

## 2024-03-22 MED ORDER — BISACODYL 10 MG RE SUPP
10.0000 mg | Freq: Every day | RECTAL | Status: DC | PRN
  Administered 2024-03-25: 10 mg via RECTAL
  Filled 2024-03-22: qty 1

## 2024-03-22 MED ORDER — SODIUM CHLORIDE 0.9% IV SOLUTION: Freq: Once | INTRAVENOUS | Status: DC

## 2024-03-22 MED ORDER — ACETAMINOPHEN 325 MG PO TABS: 650.0000 mg | ORAL_TABLET | Freq: Four times a day (QID) | ORAL | Status: DC | PRN

## 2024-03-22 MED ORDER — TRAMADOL HCL 50 MG PO TABS
25.0000 mg | ORAL_TABLET | Freq: Once | ORAL | Status: AC
  Administered 2024-03-22: 25 mg via ORAL
  Filled 2024-03-22: qty 1

## 2024-03-22 MED ORDER — LACTATED RINGERS IV BOLUS
1000.0000 mL | Freq: Once | INTRAVENOUS | Status: AC
Start: 2024-03-22 — End: 2024-03-24
  Administered 2024-03-22: 1000 mL via INTRAVENOUS

## 2024-03-22 MED ORDER — DOCUSATE SODIUM 100 MG PO CAPS
100.0000 mg | ORAL_CAPSULE | Freq: Every day | ORAL | Status: DC
Start: 2024-03-22 — End: 2024-03-29
  Administered 2024-03-23 – 2024-03-29 (×7): 100 mg via ORAL
  Filled 2024-03-22 (×7): qty 1

## 2024-03-22 MED ORDER — POTASSIUM CHLORIDE CRYS ER 20 MEQ PO TBCR
40.0000 meq | EXTENDED_RELEASE_TABLET | Freq: Once | ORAL | Status: AC
  Administered 2024-03-22: 40 meq via ORAL
  Filled 2024-03-22: qty 2

## 2024-03-22 MED ORDER — FENTANYL CITRATE (PF) 50 MCG/ML IJ SOSY
25.0000 ug | PREFILLED_SYRINGE | Freq: Once | INTRAMUSCULAR | Status: AC
  Administered 2024-03-22: 25 ug via INTRAVENOUS
  Filled 2024-03-22: qty 1

## 2024-03-22 MED ORDER — HYDROXYZINE HCL 10 MG PO TABS
10.0000 mg | ORAL_TABLET | Freq: Three times a day (TID) | ORAL | Status: DC | PRN
  Administered 2024-03-23 – 2024-03-29 (×3): 10 mg via ORAL
  Filled 2024-03-22 (×5): qty 1

## 2024-03-22 MED ORDER — FOLIC ACID 1 MG PO TABS
1.0000 mg | ORAL_TABLET | Freq: Every day | ORAL | Status: DC
  Administered 2024-03-23 – 2024-03-29 (×7): 1 mg via ORAL
  Filled 2024-03-22 (×7): qty 1

## 2024-03-22 MED ORDER — DULOXETINE HCL 20 MG PO CPEP
20.0000 mg | ORAL_CAPSULE | Freq: Every day | ORAL | Status: DC
  Administered 2024-03-22 – 2024-03-28 (×7): 20 mg via ORAL
  Filled 2024-03-22 (×7): qty 1

## 2024-03-22 MED ORDER — ONDANSETRON HCL 4 MG/2ML IJ SOLN: 4.0000 mg | Freq: Four times a day (QID) | INTRAMUSCULAR | Status: DC | PRN

## 2024-03-22 NOTE — Telephone Encounter (Signed)
 Copied from CRM [id number]. Topic: Clinical - Medical Advice >> Mar 22, 2024 11:38 AM Charolett L wrote: Reason for CRM: [facility]ri [name] reported a rash on lower body 10/19. Blister have burst open and has been confirmed as hand, foot , mouth. Having difficultly swallowing and eating. Nurse would like a call back asap Callback # [phone]

## 2024-03-22 NOTE — ED Triage Notes (Signed)
 Pt arrived via EMS from home with complaints of a productive cough that began last night and has persisted. EMS noted green/brown sputum. Pt is reportedly bed-bound due to previous injury.   Wound noted to RIGHT posterior arm.

## 2024-03-22 NOTE — H&P (Addendum)
 History and Physical  Sherri [name]:[id number] DOB: 12-09-1940 DOA: 03/22/2024  PCP: [name] PARAS, MD   Chief Complaint: Rash  HPI: Sherri  [name] is a 83 y.o. female with medical history significant for rheumatoid arthritis, MS, hyperlipidemia, multiple orthopedic surgeries including right hip arthroplasty who has undergone surgical revision of her right hip arthroplasty, has chronic osteomyelitis and is now being admitted to the hospital with recurrent rash concerning for drug reaction.  She was discharged from [facility] on 10/18 after being admitted for itchy rash on her extremities.  Her primary caregiver is at the bedside and assists significantly with providing the history.  It seems that the patient was on IV cefepime  via PICC line several weeks in the past, this was transitioned to p.o. ciprofloxacin .  She developed itchy rash over her entire body similar to the one she has now, when she started the ciprofloxacin  and was admitted to [facility].  While in the hospital, it seems the p.o. ciprofloxacin  was held and the rash completely resolved.  She resumed the ciprofloxacin  a couple weeks ago when she left the hospital, and the rash that we see now recurred.  On evaluation today in the ER, she also has pancytopenia.  1 unit PRBC has been ordered.  Review of Systems: Please see HPI for pertinent positives and negatives. A complete 10 system review of systems are otherwise negative.  Past Medical History:  Diagnosis Date   Anxiety    takes Valium  daily as needed   Basal cell carcinoma 01/21/1988   left nostril (MOHS), sup-right calf (CX35FU)   Basal cell carcinoma 03/22/1996   right calf (CX35FU)   Basal cell carcinoma 03/31/1995   left wing nose   Bruises easily    d/t meds   Cancer (HCC)    basal cell ca, in situ- uterine    Cataracts, bilateral    removed bilateral   Chronic back pain    Dizziness    r/t to meds   GERD (gastroesophageal reflux disease)    no meds on a  regular basis but will take Tums if needed   Headache(784.0)    r/t neck issues   History of bronchitis 6-30yrs ago   History of colon polyps    benign   HOH (hard of hearing)    wears hearing aids   HOH (hard of hearing)    wears hearing aids    Hyperlipidemia    takes Atorvastatin  on Mondays and Fridays   Hypertension    Joint pain    Joint swelling    Multiple sclerosis    Neuromuscular disorder (HCC)    Dr. FABIENE Crete- [facility], follows [facility].   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   Rheumatoid arthritis (HCC)    Dr Lorrayne weekly, RA- hands- knees- feet    Rheumatoid arthritis(714.0)    Dr Dolphus takes Xeljanz  daily   Right wrist fracture    Skin cancer    Squamous cell carcinoma of skin 11/02/2001   in situ-right knee (cx22fu)   Squamous cell carcinoma of skin 11/12/2011   in situ-left forearm (CX35FU), in situ-left foot (CX35FU)   Squamous cell carcinoma of skin 01/22/2012   in situ-left lower forearm (txpbx)   Squamous cell carcinoma of skin 11/17/2012   right shin (txpbx)   Squamous cell carcinoma of skin 10/28/2013  in situ-right shoulder (CX35FU)   Squamous cell carcinoma of skin 04/28/2014   well diff-right shin (txpbx)   Squamous cell carcinoma of skin 11/02/2014   in situ-Left shin (txpbx)   Squamous cell carcinoma of skin 12/15/2014   in situ-Left hand (txpbx), bowens-left side chest (txpbx)   Squamous cell carcinoma of skin 05/09/2015   in situ-left hand (txpbx)    Squamous cell carcinoma of skin 05/07/2016   in situ-left inner shin,ant, in situ-left inner shin, post, in situ-left outer forearm, in situ-right knuckle   Squamous cell carcinoma of skin 03/1302018   in situ-left shoulder (txpbx), in situ-right inner shin (txpbx), in situ-top of left foot (txpbx), KA- left forearm (txpbx)    Squamous cell carcinoma of skin 12/02/2016   in situ-left inner shin (txpbx)   Squamous cell carcinoma of skin 03/04/2017   in situ-left outer sup, shin (txpbx), in situ- right 2nd knuckle finger (txpbx), in situ- Left wrist (txpbx)   Squamous cell carcinoma of skin 04/06/2018   in situ-above left knee inner (txpbx)   Squamous cell carcinoma of skin 04/21/2018   in situ-right top hand (txpbx)   Squamous cell carcinoma of skin 07/08/2018   in situ-right lower inner shin (txpbx)   Squamous cell carcinoma of skin 04/27/2019   in situ- Left neck(CX35FU), In situ- right neck (CX35FU)   Urinary retention    sees Dr.Wrenn about 2 times a yr   Past Surgical History:  Procedure Laterality Date   ABDOMINAL HYSTERECTOMY     1985   ANTERIOR CERVICAL DECOMP/DISCECTOMY FUSION N/A 04/23/2022   Procedure: Anterior Cervical Decompression/Discectomy Fusion - Cervical Seven-Thoracic One;  Surgeon: Louis Shove, MD;  Location: [facility] OR;  Service: Neurosurgery;  Laterality: N/A;  3C   ANTERIOR HIP REVISION Right 01/21/2024   Procedure: REVISION, TOTAL ARTHROPLASTY, HIP, ANTERIOR APPROACH;  Surgeon: Fidel Rogue, MD;  Location: [facility];  Service: Orthopedics;  Laterality: Right;   APPENDECTOMY     with TAH   BACK SURGERY     several   BREAST BIOPSY Right    Results were negative   BROW LIFT Bilateral 10/02/2016   Procedure: BILATERAL LOWER LID BLEPHAROPLASTY;  Surgeon: Laurie Loyd Redhead, MD;  Location: [facility] OR;  Service: Plastics;  Laterality: Bilateral;   CARPAL TUNNEL RELEASE Right    cataracts     CERVICAL FUSION     Dr Leeann   CERVICAL FUSION  12/31/2012   Dr Leeann   CERVICAL FUSION  04/2022   CHEST TUBE INSERTION     for traumatic Pneumothorax   COLONOSCOPY  07/16/2010   normal    COLONOSCOPY W/ POLYPECTOMY  1997   negative since; Dr Debrah   ESOPHAGOGASTRODUODENOSCOPY  07/16/2010   normal   eye lid raise     EYE SURGERY Bilateral    cataracts removed - /w IOL   HARDWARE REMOVAL   08/2021   HIP CLOSED REDUCTION Right 11/16/2023   Procedure: CLOSED MANIPULATION, JOINT, HIP;  Surgeon: Ernie Cough, MD;  Location: [facility];  Service: Orthopedics;  Laterality: Right;   HIP CLOSED REDUCTION N/A 11/30/2023   Procedure: CLOSED REDUCTION, HIP;  Surgeon: Fidel Rogue, MD;  Location: [facility];  Service: Orthopedics;  Laterality: N/A;   INCISION AND DRAINAGE OF WOUND Right 11/28/2023   Procedure: IRRIGATION AND DEBRIDEMENT WOUND; REMOVAL OF HARDWARE;  Surgeon: Edna Toribio LABOR, MD;  Location: [facility];  Service: Orthopedics;  Laterality: Right;  I & D RIGHT ELBOW   JOINT REPLACEMENT  LUMBAR FUSION     Dr Leeann   NASAL SINUS SURGERY     ORIF ELBOW FRACTURE Right 11/20/2023   Procedure: OPEN REDUCTION INTERNAL FIXATION (ORIF) ELBOW/OLECRANON FRACTURE;  Surgeon: [name] Franky SQUIBB, MD;  Location: [facility];  Service: Orthopedics;  Laterality: Right;   POSTERIOR CERVICAL FUSION/FORAMINOTOMY N/A 12/31/2012   Procedure: POSTERIOR LATERAL CERVICAL FUSION/FORAMINOTOMY LEVEL 1 CERVICAL THREE-FOUR WITH LATERAL MASS SCREWS;  Surgeon: Catalina CHRISTELLA Leeann, MD;  Location: [facility];  Service: Neurosurgery;  Laterality: N/A;   PTOSIS REPAIR Bilateral 10/02/2016   Procedure: INTERNAL PTOSIS REPAIR;  Surgeon: Laurie Loyd Redhead, MD;  Location: [facility];  Service: Plastics;  Laterality: Bilateral;   SKIN BIOPSY     THYROID  SURGERY     R lobe removed- 1972, has grown back - CT last done- 2018   TOTAL HIP ARTHROPLASTY  12/22/2011   Procedure: TOTAL HIP ARTHROPLASTY;  Surgeon: Dempsey JINNY Sensor, MD;  Location: [facility];  Service: Orthopedics;  Laterality: Left;   TOTAL HIP ARTHROPLASTY Right 11/12/2023   Procedure: ARTHROPLASTY, HIP, TOTAL, ANTERIOR APPROACH;  Surgeon: Fidel Rogue, MD;  Location: [facility];  Service: Orthopedics;  Laterality: Right;   TOTAL HIP REVISION Right 12/03/2023   Procedure: TOTAL HIP REVISION;  Surgeon: Fidel Rogue, MD;  Location: [facility];  Service: Orthopedics;  Laterality: Right;  Right  acetabular revision Anterior approach   TOTAL SHOULDER ARTHROPLASTY     TUBAL LIGATION     UPPER GASTROINTESTINAL ENDOSCOPY  2012   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   WRIST SURGERY Left 09/16/2022   wrist/arm   Social History:  reports that she quit smoking about 42 years ago. Her smoking use included cigarettes. She started smoking about 62 years ago. She has a 50 pack-year smoking history. She has never been exposed to tobacco smoke. She has never used smokeless tobacco. She reports current alcohol  use of about 14.0 standard drinks of alcohol  per week. She reports that she does not use drugs.  Allergies  Allergen Reactions   Darvon [Propoxyphene Hcl] Shortness Of Breath    ? Dose Related ? Lowered respirations greatly   Demerol [Meperidine] Shortness Of Breath and Other (See Comments)    Respiratory Distress   Penicillins Hives and Other (See Comments)    Has patient had a PCN reaction causing immediate rash, facial/tongue/throat swelling, SOB or lightheadedness with hypotension: No SEVERE RASH INVOLVING MUCUS MEMBRANES or SKIN NECROSIS: #  #  #  YES  #  #  #  Has patient had a PCN reaction that required hospitalization No Has patient had a PCN reaction occurring within the last 10 years: No.    Imuran [Azathioprine] Other (See Comments)    Weakness   Sulfa  Antibiotics Nausea Only and Other (See Comments)    States had severe indigestion and heartburn and nausea and had to stop taking   Arava [Leflunomide] Other (See Comments)    Excessive weight gain   Lipitor [Atorvastatin ] Nausea And Vomiting    Currently taking 2 times weekly - able to tolerate low dose    Vytorin [Ezetimibe-Simvastatin] Nausea And Vomiting    Family History  Problem Relation Age of Onset   Cancer Mother        pancreatic   Diabetes Mother    Pancreatic cancer Mother    Heart disease Father        Rheumatic   Cancer Father        ? stomach   Stomach cancer Father  Cancer Sister        stomach    Stomach cancer Sister    Cancer Maternal Aunt        X 4; ? primary   Heart disease Paternal Aunt    Cancer Maternal Grandmother        cervical   Colon cancer Other        Aunts   Multiple sclerosis Daughter    Hypertension Neg Hx    Stroke Neg Hx    Esophageal cancer Neg Hx    Rectal cancer Neg Hx      Prior to Admission medications   Medication Sig Start Date End Date Taking? Authorizing Provider  acetaminophen  (TYLENOL ) 325 MG tablet Take 2 tablets (650 mg total) by mouth every 6 (six) hours as needed for mild pain (pain score 1-3) or fever (or Fever >/= 101). 11/20/23   Dorinda Drue DASEN, MD  Lactobacillus (ACIDOPHILUS) CAPS capsule Take 2 capsules by mouth 3 (three) times daily. 03/12/24   Arlon Carliss ORN, DO  ascorbic acid  (VITAMIN C ) 1000 MG tablet Take 1 tablet (1,000 mg total) by mouth daily. 12/23/23   Jerilynn Daphne SAILOR, NP  atorvastatin  (LIPITOR) 10 MG tablet Take 1 tablet (10 mg total) by mouth every evening. 12/22/23   Jerilynn Daphne SAILOR, NP  Biotin  5000 MCG TABS Take 10,000 mcg by mouth in the morning.    [provider]  bisacodyl  (DULCOLAX) 10 MG suppository Place 1 suppository (10 mg total) rectally daily as needed for moderate constipation. 11/20/23   Dorinda Drue DASEN, MD  Carboxymethylcellul-Glycerin  (LUBRICATING EYE DROPS OP) Place 1 drop into both eyes daily as needed (dry eyes).    [provider]  cholecalciferol  (VITAMIN D ) 25 MCG (1000 UNIT) tablet Take 1,000 Units by mouth in the morning.    [provider]  ciprofloxacin  (CIPRO ) 500 MG tablet Take 1 tablet (500 mg total) by mouth 2 (two) times daily. 02/26/24   Fleeta Kathie Jomarie SAILOR, MD  cyclobenzaprine  (FLEXERIL ) 10 MG tablet TAKE 1 TABLET BY MOUTH THREE TIMES A DAY AS NEEDED FOR MUSCLE SPASM Patient taking differently: Take 10 mg by mouth 3 (three) times daily as needed for muscle spasms. 06/11/23   [name] PARAS, MD  denosumab  (PROLIA ) 60 MG/ML SOSY injection Inject 60 mg into the skin  every 6 (six) months. Deliver to rheum: [address], [address], [place], [place] [address]. Appt on 07/23/2022 07/17/22 11/26/24  Cheryl Waddell HERO, PA-C  diazepam  (VALIUM ) 5 MG tablet TAKE 1 TABLET BY MOUTH AT BEDTIME AS NEEDED FOR MUSCLE SPASMS. 02/12/24   [name] PARAS, MD  docusate sodium  (COLACE) 100 MG capsule Take 1 capsule (100 mg total) by mouth daily. 11/21/23   Dorinda Drue DASEN, MD  DULoxetine  (CYMBALTA ) 20 MG capsule TAKE 1 CAPSULE BY MOUTH AT BEDTIME. 02/09/24   Burns, Glade PARAS, MD  folic acid  (FOLVITE ) 1 MG tablet Take 1 tablet (1 mg total) by mouth daily. 09/02/23   Cheryl Waddell HERO, PA-C  furosemide  (LASIX ) 20 MG tablet Take 0.5 tablets (10 mg total) by mouth daily. 12/23/23   Jerilynn Daphne SAILOR, NP  hydrOXYzine  (ATARAX ) 10 MG tablet Take 1 tablet (10 mg total) by mouth 3 (three) times daily as needed for itching. 03/12/24   Arlon Carliss ORN, DO  magic mouthwash (nystatin , lidocaine , diphenhydrAMINE , alum & mag hydroxide) suspension Swish and spit 5 mLs 3 (three) times daily as needed for mouth pain. 03/15/24   [name] PARAS, MD  methotrexate  (  RHEUMATREX) 2.5 MG tablet TAKE 5 TABLETS (12.5 MG TOTAL) BY MOUTH ONCE A WEEK. CAUTION:CHEMOTHERAPY. PROTECT FROM LIGHT. 03/01/24   Dolphus Reiter, MD  Nystatin  (GERHARDT'S BUTT CREAM) CREA Apply 1 Application topically 3 (three) times daily. 01/06/24   [name] PARAS, MD  oxyCODONE  (ROXICODONE ) 5 MG immediate release tablet Take 1 tablet (5 mg total) by mouth every 4 (four) hours as needed for severe pain (pain score 7-10). 01/26/24   Hill, Valery RAMAN, PA-C  pantoprazole  (PROTONIX ) 40 MG tablet Take 1 tablet (40 mg total) by mouth 2 (two) times daily before a meal. 07/27/23   Burns, Glade PARAS, MD  polyethylene glycol powder (GLYCOLAX /MIRALAX ) 17 GM/SCOOP powder Take 17 g by mouth daily. 03/12/24   Arlon Carliss ORN, DO  potassium chloride  SA (KLOR-CON  M) 20 MEQ tablet TAKE 1 TABLET (20 MEQ TOTAL) BY MOUTH EVERY OTHER DAY. 03/07/24   [name] PARAS, MD  predniSONE   (DELTASONE ) 5 MG tablet TAKE 1 TABLET BY MOUTH EVERY DAY IN THE MORNING 05/22/23   Cheryl Waddell HERO, PA-C  senna (SENOKOT) 8.6 MG tablet Take 1 tablet by mouth at bedtime.    [provider]  sodium chloride  1 g tablet Take 1 tablet (1 g total) by mouth 2 (two) times daily with a meal. 02/03/24   Burns, Glade PARAS, MD  tamsulosin  (FLOMAX ) 0.4 MG CAPS capsule TAKE 1 CAPSULE BY MOUTH EVERY DAY AFTER SUPPER 02/09/24   [name] PARAS, MD  Upadacitinib  ER ([name] ) 15 MG TB24 Take 15 mg by mouth daily.    [provider]  Zinc  Sulfate 220 (50 Zn) MG TABS Take 1 tablet (220 mg total) by mouth daily with supper. 12/22/23   Jerilynn Daphne SAILOR, NP    Physical Exam: BP (!) 135/58   Pulse (!) 102   Temp 97.7 F (36.5 C) (Oral)   Resp 16   SpO2 99%  General:  Alert, oriented, calm, in no acute distress  Eyes: EOMI, clear conjuctivae, white sclerea Neck: supple, no masses, trachea mildline  Cardiovascular: RRR, no murmurs or rubs, no peripheral edema  Respiratory: clear to auscultation bilaterally, no wheezes, no crackles  Abdomen: soft, nontender, nondistended, normal bowel tones heard  Musculoskeletal: no joint effusions, normal range of motion  Psychiatric: appropriate affect, normal speech  Neurologic: extraocular muscles intact, clear speech, moving all extremities with intact sensorium Bilateral lower extremities are wrapped, patient's private caregiver who is at the bedside was able to show me photographs taken this morning prior to wrapping.  She has patchy rash without significant erythema, no edema on her bilateral anterior legs, similar to those on her left lateral neck no obvious visible open blister seen on the photographs              Labs on Admission:  Basic Metabolic Panel: Recent Labs  Lab 03/22/24 1507 03/22/24 1527  NA 135 134*  K 3.1* 3.0*  CL 99 96*  CO2 26  --   GLUCOSE 77 75  BUN <5* <3*  CREATININE 0.53 0.50  CALCIUM  7.1*  --    Liver Function  Tests: No results for input(s): AST, ALT, ALKPHOS, BILITOT, PROT, ALBUMIN  in the last 168 hours. No results for input(s): LIPASE, AMYLASE in the last 168 hours. No results for input(s): AMMONIA in the last 168 hours. CBC: Recent Labs  Lab 03/22/24 1507 03/22/24 1527  WBC 2.3*  --   NEUTROABS 1.6*  --   HGB 6.4* 8.8*  HCT 20.3* 26.0*  MCV 92.7  --  PLT 55*  --    Cardiac Enzymes: No results for input(s): CKTOTAL, CKMB, CKMBINDEX, TROPONINI in the last 168 hours. BNP (last 3 results) No results for input(s): BNP in the last 8760 hours.  ProBNP (last 3 results) No results for input(s): PROBNP in the last 8760 hours.  CBG: No results for input(s): GLUCAP in the last 168 hours.  Radiological Exams on Admission: DG Chest 1 View Result Date: 03/22/2024 CLINICAL DATA:  Shortness of breath EXAM: CHEST  1 VIEW COMPARISON:  Chest x-ray performed Oct 02, 2023 FINDINGS: Heart mediastinum are not significantly changed. No focal infiltrate, pleural effusion, or pneumothorax. IMPRESSION: 1. No plain film evidence of lobar infiltrate. Electronically Signed   By: Maude Naegeli M.D.   On: 03/22/2024 13:52   Assessment/Plan Sherri [name] is a 83 y.o. female with medical history significant for rheumatoid arthritis, MS, hyperlipidemia, multiple orthopedic surgeries including right hip arthroplasty who has undergone surgical revision of her right hip arthroplasty, has chronic osteomyelitis and is now being admitted to the hospital with recurrent rash concerning for drug reaction.   Rash-unclear etiology, but this seems to be the second time that she developed a rash after taking ciprofloxacin .  Rash may also be related to pemphigus vulgaris but time course would definitely favor ciprofloxacin  reaction. -Inpatient admission -Given IV Solu-Medrol  in the emergency department -Hold ciprofloxacin  for now as it may be the source of her rash, discussed with ID will see her  tomorrow to recommend alternative therapy  Chronic osteomyelitis-requested ID consultation for antibiotic management recommendations, since the patient seems to be developing drug rash with ciprofloxacin   Acute anemia superimposed on anemia of chronic disease, no evidence of blood loss. -Transfuse 1 unit PRBC -Goal hemoglobin greater than 7  Pancytopenia-unclear etiology, potentially related to drug reaction, autoimmune condition related to her RA, etc. -ER provider has discussed with oncology who will consult  Elevated troponin-EKG personally reviewed, no changes, patient denies any chest pain.  Potentially related to heart strain from her significant anemia? -Trend troponin -Monitor on telemetry  DVT prophylaxis: SCDs only    Code Status: Full Code  Consults called: Hematology, ID  Admission status: The appropriate patient status for this patient is INPATIENT. Inpatient status is judged to be reasonable and necessary in order to provide the required intensity of service to ensure the patient's safety. The patient's presenting symptoms, physical exam findings, and initial radiographic and laboratory data in the context of their chronic comorbidities is felt to place them at high risk for further clinical deterioration. Furthermore, it is not anticipated that the patient will be medically stable for discharge from the hospital within 2 midnights of admission.    I certify that at the point of admission it is my clinical judgment that the patient will require inpatient hospital care spanning beyond 2 midnights from the point of admission due to high intensity of service, high risk for further deterioration and high frequency of surveillance required  Time spent: 53 minutes  Joscelyn Hardrick CHRISTELLA Gail MD [facility] Pager [phone]  If 7PM-7AM, please contact night-coverage [email or web address] Password [email or web address]  03/22/2024, 4:55 PM

## 2024-03-22 NOTE — ED Notes (Signed)
 Blood bank has 2 units of blood ready, informed Alyssa,EMT-P.

## 2024-03-22 NOTE — ED Provider Notes (Signed)
 [place] EMERGENCY DEPARTMENT AT [facility] Provider Note   CSN: [id number] Arrival date & time: 03/22/24  1239     Patient presents with: Cough   Sherri [name] is a 83 y.o. female.   83 year old female presents for evaluation of mouth blisters as well as new blisters on her arms and legs.  She states she initially started breaking out after taking Cipro  which she has been on for a while now.  States last night she developed some blisters in her mouth and they are very painful.  Family at bedside states that they have been popping them.  Leaking fluid.  Patient has difficulty eating and drinking because of this.  Admits to frequent productive cough as well.  Denies any other symptoms or concerns.        Prior to Admission medications   Medication Sig Start Date End Date Taking? Authorizing Provider  acetaminophen  (TYLENOL ) 325 MG tablet Take 2 tablets (650 mg total) by mouth every 6 (six) hours as needed for mild pain (pain score 1-3) or fever (or Fever >/= 101). 11/20/23   Dorinda Drue DASEN, MD  Lactobacillus (ACIDOPHILUS) CAPS capsule Take 2 capsules by mouth 3 (three) times daily. 03/12/24   Arlon Carliss ORN, DO  ascorbic acid  (VITAMIN C ) 1000 MG tablet Take 1 tablet (1,000 mg total) by mouth daily. 12/23/23   Jerilynn Daphne SAILOR, NP  atorvastatin  (LIPITOR) 10 MG tablet Take 1 tablet (10 mg total) by mouth every evening. 12/22/23   Jerilynn Daphne SAILOR, NP  Biotin  5000 MCG TABS Take 10,000 mcg by mouth in the morning.    [provider]  bisacodyl  (DULCOLAX) 10 MG suppository Place 1 suppository (10 mg total) rectally daily as needed for moderate constipation. 11/20/23   Dorinda Drue DASEN, MD  Carboxymethylcellul-Glycerin  (LUBRICATING EYE DROPS OP) Place 1 drop into both eyes daily as needed (dry eyes).    [provider]  cholecalciferol  (VITAMIN D ) 25 MCG (1000 UNIT) tablet Take 1,000 Units by mouth in the morning.    [provider]  ciprofloxacin   (CIPRO ) 500 MG tablet Take 1 tablet (500 mg total) by mouth 2 (two) times daily. 02/26/24   Fleeta Kathie Jomarie SAILOR, MD  cyclobenzaprine  (FLEXERIL ) 10 MG tablet TAKE 1 TABLET BY MOUTH THREE TIMES A DAY AS NEEDED FOR MUSCLE SPASM Patient taking differently: Take 10 mg by mouth 3 (three) times daily as needed for muscle spasms. 06/11/23   [name] PARAS, MD  denosumab  (PROLIA ) 60 MG/ML SOSY injection Inject 60 mg into the skin every 6 (six) months. Deliver to rheum: [address], [place], [place] [address]. Appt on 07/23/2022 07/17/22 11/26/24  Cheryl Waddell HERO, PA-C  diazepam  (VALIUM ) 5 MG tablet TAKE 1 TABLET BY MOUTH AT BEDTIME AS NEEDED FOR MUSCLE SPASMS. 02/12/24   [name] PARAS, MD  docusate sodium  (COLACE) 100 MG capsule Take 1 capsule (100 mg total) by mouth daily. 11/21/23   Dorinda Drue DASEN, MD  DULoxetine  (CYMBALTA ) 20 MG capsule TAKE 1 CAPSULE BY MOUTH AT BEDTIME. 02/09/24   Burns, Glade PARAS, MD  folic acid  (FOLVITE ) 1 MG tablet Take 1 tablet (1 mg total) by mouth daily. 09/02/23   Cheryl Waddell HERO, PA-C  furosemide  (LASIX ) 20 MG tablet Take 0.5 tablets (10 mg total) by mouth daily. 12/23/23   Jerilynn Daphne SAILOR, NP  hydrOXYzine  (ATARAX ) 10 MG tablet Take 1 tablet (10 mg total) by mouth 3 (three) times daily as needed for itching. 03/12/24  Arlon Carliss ORN, DO  magic mouthwash (nystatin , lidocaine , diphenhydrAMINE , alum & mag hydroxide) suspension Swish and spit 5 mLs 3 (three) times daily as needed for mouth pain. 03/15/24   Burns, Glade PARAS, MD  methotrexate  (RHEUMATREX) 2.5 MG tablet TAKE 5 TABLETS (12.5 MG TOTAL) BY MOUTH ONCE A WEEK. CAUTION:CHEMOTHERAPY. PROTECT FROM LIGHT. 03/01/24   Dolphus Reiter, MD  Nystatin  (GERHARDT'S BUTT CREAM) CREA Apply 1 Application topically 3 (three) times daily. 01/06/24   [name] PARAS, MD  oxyCODONE  (ROXICODONE ) 5 MG immediate release tablet Take 1 tablet (5 mg total) by mouth every 4 (four) hours as needed for severe pain (pain score 7-10). 01/26/24   Hill, Valery RAMAN,  PA-C  pantoprazole  (PROTONIX ) 40 MG tablet Take 1 tablet (40 mg total) by mouth 2 (two) times daily before a meal. 07/27/23   Burns, Glade PARAS, MD  polyethylene glycol powder (GLYCOLAX /MIRALAX ) 17 GM/SCOOP powder Take 17 g by mouth daily. 03/12/24   Arlon Carliss ORN, DO  potassium chloride  SA (KLOR-CON  M) 20 MEQ tablet TAKE 1 TABLET (20 MEQ TOTAL) BY MOUTH EVERY OTHER DAY. 03/07/24   [name] PARAS, MD  predniSONE  (DELTASONE ) 5 MG tablet TAKE 1 TABLET BY MOUTH EVERY DAY IN THE MORNING 05/22/23   Cheryl Waddell HERO, PA-C  senna (SENOKOT) 8.6 MG tablet Take 1 tablet by mouth at bedtime.    [provider]  sodium chloride  1 g tablet Take 1 tablet (1 g total) by mouth 2 (two) times daily with a meal. 02/03/24   Burns, Glade PARAS, MD  tamsulosin  (FLOMAX ) 0.4 MG CAPS capsule TAKE 1 CAPSULE BY MOUTH EVERY DAY AFTER SUPPER 02/09/24   [name] PARAS, MD  Upadacitinib  ER ([name] ) 15 MG TB24 Take 15 mg by mouth daily.    [provider]  Zinc  Sulfate 220 (50 Zn) MG TABS Take 1 tablet (220 mg total) by mouth daily with supper. 12/22/23   Jerilynn Daphne SAILOR, NP    Allergies: Darvon [propoxyphene hcl], Demerol [meperidine], Penicillins, Imuran [azathioprine], Sulfa  antibiotics, Arava [leflunomide], Lipitor [atorvastatin ], and Vytorin [ezetimibe-simvastatin]    Review of Systems  Constitutional:  Positive for fever. Negative for chills.  HENT:  Negative for ear pain and sore throat.        Mouth blisters  Eyes:  Negative for pain and visual disturbance.  Respiratory:  Positive for cough and shortness of breath.   Cardiovascular:  Negative for chest pain and palpitations.  Gastrointestinal:  Negative for abdominal pain and vomiting.  Genitourinary:  Negative for dysuria and hematuria.  Musculoskeletal:  Negative for arthralgias and back pain.  Skin:  Negative for color change and rash.  Neurological:  Negative for seizures and syncope.  All other systems reviewed and are negative.   Updated Vital  Signs BP (!) 135/58   Pulse (!) 102   Temp 97.7 F (36.5 C) (Oral)   Resp 16   SpO2 99%   Physical Exam Vitals and nursing note reviewed.  Constitutional:      General: She is not in acute distress.    Appearance: She is well-developed.     Comments: Chronically ill appearing, frail   HENT:     Head: Normocephalic and atraumatic.     Mouth/Throat:     Comments: Dry mucous membranes, blisters on mucous membranes  Eyes:     Conjunctiva/sclera: Conjunctivae normal.  Cardiovascular:     Rate and Rhythm: Normal rate and regular rhythm.     Heart sounds: No murmur heard. Pulmonary:  Effort: Pulmonary effort is normal. No respiratory distress.     Breath sounds: Normal breath sounds.  Abdominal:     Palpations: Abdomen is soft.     Tenderness: There is no abdominal tenderness.  Musculoskeletal:        General: No swelling.     Cervical back: Neck supple.  Skin:    General: Skin is warm and dry.     Capillary Refill: Capillary refill takes less than 2 seconds.     Comments: Blisters on arms and legs with +nikolsky sign   Neurological:     Mental Status: She is alert.  Psychiatric:        Mood and Affect: Mood normal.     (all labs ordered are listed, but only abnormal results are displayed) Labs Reviewed  BASIC METABOLIC PANEL WITH GFR - Abnormal; Notable for the following components:      Result Value   Potassium 3.1 (*)    BUN <5 (*)    Calcium  7.1 (*)    All other components within normal limits  CBC WITH DIFFERENTIAL/PLATELET - Abnormal; Notable for the following components:   WBC 2.3 (*)    RBC 2.19 (*)    Hemoglobin 6.4 (*)    HCT 20.3 (*)    RDW 21.5 (*)    Platelets 55 (*)    Neutro Abs 1.6 (*)    Lymphs Abs 0.5 (*)    All other components within normal limits  HEPATIC FUNCTION PANEL - Abnormal; Notable for the following components:   Total Protein 4.9 (*)    Albumin  2.5 (*)    Indirect Bilirubin 0.2 (*)    All other components within normal limits   I-STAT CHEM 8, ED - Abnormal; Notable for the following components:   Sodium 134 (*)    Potassium 3.0 (*)    Chloride 96 (*)    BUN <3 (*)    Calcium , Ion 1.02 (*)    Hemoglobin 8.8 (*)    HCT 26.0 (*)    All other components within normal limits  TROPONIN T, HIGH SENSITIVITY - Abnormal; Notable for the following components:   Troponin T High Sensitivity 70 (*)    All other components within normal limits  RESP PANEL BY RT-PCR (RSV, FLU A&B, COVID)  RVPGX2  LIPASE, BLOOD  URINALYSIS, W/ REFLEX TO CULTURE (INFECTION SUSPECTED)  VITAMIN B12  IRON AND TIBC  FOLATE  VITAMIN B12  FOLATE  IRON AND TIBC  FERRITIN  RETICULOCYTES  HEMOGLOBIN AND HEMATOCRIT, BLOOD  BASIC METABOLIC PANEL WITH GFR  CBC  I-STAT CG4 LACTIC ACID, ED  I-STAT CG4 LACTIC ACID, ED  TYPE AND SCREEN  PREPARE RBC (CROSSMATCH)  TROPONIN T, HIGH SENSITIVITY    EKG: EKG Interpretation Date/Time:  Tuesday March 22 2024 13:46:33 EDT Ventricular Rate:  103 PR Interval:  139 QRS Duration:  78 QT Interval:  351 QTC Calculation: 460 R Axis:   54  Text Interpretation: Sinus tachycardia Ventricular premature complex Low voltage, precordial leads Borderline T abnormalities, anterior leads Compared with prior EKG from 12/04/2023 Confirmed by Gennaro Bouchard ([id number]) on 03/22/2024 1:48:28 PM  Radiology: [facility] Chest 1 View Result Date: 03/22/2024 CLINICAL DATA:  Shortness of breath EXAM: CHEST  1 VIEW COMPARISON:  Chest x-ray performed Oct 02, 2023 FINDINGS: Heart mediastinum are not significantly changed. No focal infiltrate, pleural effusion, or pneumothorax. IMPRESSION: 1. No plain film evidence of lobar infiltrate. Electronically Signed   By: Maude Naegeli M.D.   On: 03/22/2024  13:52     Procedures   Medications Ordered in the ED  potassium chloride  SA (KLOR-CON  M) CR tablet 40 mEq (40 mEq Oral Not Given 03/22/24 1652)  0.9 %  sodium chloride  infusion (Manually program via Guardrails IV Fluids) (has no  administration in time range)  methylPREDNISolone  sodium succinate  (SOLU-MEDROL ) 40 mg/mL injection 40 mg (has no administration in time range)  atorvastatin  (LIPITOR) tablet 10 mg (has no administration in time range)  furosemide  (LASIX ) tablet 10 mg (has no administration in time range)  DULoxetine  (CYMBALTA ) DR capsule 20 mg (has no administration in time range)  hydrOXYzine  (ATARAX ) tablet 10 mg (has no administration in time range)  bisacodyl  (DULCOLAX) suppository 10 mg (has no administration in time range)  docusate sodium  (COLACE) capsule 100 mg (has no administration in time range)  Acidophilus capsule CAPS 2 capsule (has no administration in time range)  pantoprazole  (PROTONIX ) EC tablet 40 mg (has no administration in time range)  folic acid  (FOLVITE ) tablet 1 mg (has no administration in time range)  acetaminophen  (TYLENOL ) tablet 650 mg (has no administration in time range)    Or  acetaminophen  (TYLENOL ) suppository 650 mg (has no administration in time range)  traZODone  (DESYREL ) tablet 25 mg (has no administration in time range)  ondansetron  (ZOFRAN ) tablet 4 mg (has no administration in time range)    Or  ondansetron  (ZOFRAN ) injection 4 mg (has no administration in time range)  albuterol  (PROVENTIL ) (2.5 MG/3ML) 0.083% nebulizer solution 2.5 mg (has no administration in time range)  lactated ringers  bolus 1,000 mL (1,000 mLs Intravenous New Bag/Given 03/22/24 1625)  fentaNYL  (SUBLIMAZE ) injection 25 mcg (25 mcg Intravenous Given 03/22/24 1633)                                    Medical Decision Making Cardiac monitor interpretation: Sinus tachycardia, no ectopy  Patient here for new rash Polite think his pemphigus wound care is based on findings of Nikolsky sign and blisters in her mouth.  Also found to have new pancytopenia and anemia.  Not having any bleeding and she is not on blood thinners.  Will transfuse her 1 unit PRBCs.  I talked with hospitalist regarding the  patient's case and we met the patient for further workup and management.  Also talked to oncology on-call and they recommended formal consult, getting iron studies and anemia panels and it was okay to start steroids for possible pemphigus Cabbell Garris.  Patient and family updated on results and they are agreeable with plan for admission.  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    Details: Ordered and reviewed by me and patient was here about a week ago for rash and discharge and next day, she has a history of anemia but never this significant Labs: ordered. Decision-making details documented in ED Course.    Details: Ordered and reviewed by me and patient has a new pancytopenia today and anemia that will need to be transfused Radiology: ordered and independent interpretation performed. Decision-making details documented in ED Course.  Details: Ordered and interpreted by me independently of radiology Chest x-ray: Shows no acute abnormality ECG/medicine tests: ordered and independent interpretation performed. Decision-making details documented in ED Course.    Details: Ordered and interpreted by me in the absence of cardiology and shows sinus rhythm, no STEMI or significant change when compared to prior Discussion of management or test interpretation with external provider(s): Dr. Dorie - heme/onc -I spoke with her on the phone regarding the patient.  She recommends okay to start steroids.  Will start them at 1 mg/kg.  Recommend  getting iron panels and anemia panels and they will plan to consult and see the patient tomorrow  Dr. Zella -hospitalist-I spoke with him on the phone current patient's case and he will admit the patient for further workup and management  Risk OTC drugs. Prescription drug management. Parenteral controlled substances. Drug therapy requiring intensive monitoring for toxicity. Decision regarding hospitalization. Risk Details: CRITICAL CARE Performed by: Duwaine LITTIE Fusi   Total critical care time: 35 minutes  Critical care time was exclusive of separately billable procedures and treating other patients.  Critical care was necessary to treat or prevent imminent or life-threatening deterioration.  Critical care was time spent personally by me on the following activities: development of treatment plan with patient and/or surrogate as well as nursing, discussions with consultants, evaluation of patient's response to treatment, examination of patient, obtaining history from patient or surrogate, ordering and performing treatments and interventions, ordering and review of laboratory studies, ordering and review of radiographic studies, pulse oximetry and re-evaluation of patient's condition.   Critical Care Total time providing critical care: 35 minutes     Final diagnoses:  Pancytopenia (HCC)  Symptomatic anemia  Pemphigus vulgaris (HCC)  Hypokalemia    ED Discharge Orders     None          Fusi Duwaine LITTIE, DO 03/22/24 1705

## 2024-03-22 NOTE — Telephone Encounter (Signed)
 Received voicemail in triage to give patient a call return regarding new rash. Description/Location of rash was not discloed on the voice message. Triage staff attempted to return call, no answer. Staff LVM to return call. Will also send patient a mychart message to attempted to connect with patient  Enis Kleine, LPN

## 2024-03-22 NOTE — Telephone Encounter (Signed)
 Patient current in hospital - admitted today

## 2024-03-23 ENCOUNTER — Encounter (HOSPITAL_COMMUNITY): Payer: Self-pay | Admitting: Internal Medicine

## 2024-03-23 DIAGNOSIS — D61818 Other pancytopenia: Secondary | ICD-10-CM | POA: Diagnosis not present

## 2024-03-23 DIAGNOSIS — L89153 Pressure ulcer of sacral region, stage 3: Secondary | ICD-10-CM

## 2024-03-23 DIAGNOSIS — T8451XA Infection and inflammatory reaction due to internal right hip prosthesis, initial encounter: Secondary | ICD-10-CM

## 2024-03-23 DIAGNOSIS — M069 Rheumatoid arthritis, unspecified: Secondary | ICD-10-CM

## 2024-03-23 DIAGNOSIS — R21 Rash and other nonspecific skin eruption: Secondary | ICD-10-CM

## 2024-03-23 DIAGNOSIS — G35D Multiple sclerosis, unspecified: Secondary | ICD-10-CM

## 2024-03-23 LAB — BASIC METABOLIC PANEL WITH GFR
Anion gap: 6 (ref 5–15)
BUN: <5 mg/dL — ABNORMAL LOW (ref 8–23)
CO2: 27 mmol/L (ref 22–32)
Calcium: 7.3 mg/dL — ABNORMAL LOW (ref 8.9–10.3)
Chloride: 99 mmol/L (ref 98–111)
Creatinine, Ser: 0.5 mg/dL (ref 0.44–1.00)
GFR, Estimated: >60 mL/min (ref >60)
Glucose, Bld: 73 mg/dL (ref 70–99)
Potassium: 3.7 mmol/L (ref 3.5–5.1)
Sodium: 132 mmol/L — ABNORMAL LOW (ref 135–145)

## 2024-03-23 LAB — BPAM RBC
Blood Product Expiration Date: 202511162359
ISSUE DATE / TIME: 202510281726
Unit Type and Rh: 6200

## 2024-03-23 LAB — CBC
HCT: 28.4 % — ABNORMAL LOW (ref 36.0–46.0)
Hemoglobin: 9.5 g/dL — ABNORMAL LOW (ref 12.0–15.0)
MCH: 30.4 pg (ref 26.0–34.0)
MCHC: 33.5 g/dL (ref 30.0–36.0)
MCV: 90.7 fL (ref 80.0–100.0)
Platelets: 45 K/uL — ABNORMAL LOW (ref 150–400)
RBC: 3.13 MIL/uL — ABNORMAL LOW (ref 3.87–5.11)
RDW: 20.5 % — ABNORMAL HIGH (ref 11.5–15.5)
WBC: 3.4 K/uL — ABNORMAL LOW (ref 4.0–10.5)
nRBC: 0 % (ref 0.0–0.2)

## 2024-03-23 LAB — TYPE AND SCREEN
ABO/RH(D): A POS
Antibody Screen: NEGATIVE
Unit division: 0

## 2024-03-23 MED ORDER — LIDOCAINE VISCOUS HCL 2 % MT SOLN
15.0000 mL | OROMUCOSAL | Status: DC | PRN
  Administered 2024-03-23 – 2024-03-27 (×7): 15 mL via OROMUCOSAL
  Filled 2024-03-23 (×9): qty 15

## 2024-03-23 MED ORDER — PHENOL 1.4 % MT LIQD
1.0000 | OROMUCOSAL | Status: DC | PRN
  Administered 2024-03-23 – 2024-03-26 (×2): 1 via OROMUCOSAL
  Filled 2024-03-23 (×2): qty 177

## 2024-03-23 MED ORDER — GUAIFENESIN 100 MG/5ML PO LIQD
5.0000 mL | ORAL | Status: DC | PRN
  Administered 2024-03-23 – 2024-03-25 (×4): 5 mL via ORAL
  Filled 2024-03-23 (×4): qty 10

## 2024-03-23 NOTE — Consult Note (Signed)
 [facility] CONSULT NOTE  Patient Care Team: [name] PARAS, MD as PCP - General (Internal Medicine) Sater, Charlie LABOR, MD (Neurology) Dolphus Reiter, MD as Consulting Physician (Rheumatology) Fate Morna SAILOR, [facility] (Inactive) as Pharmacist (Pharmacist) Sheffield, Andrez SAUNDERS, PA-C (Inactive) as Physician Assistant (Dermatology) Caleen Dirks, MD as Consulting Physician (Internal Medicine) Duwayne Purchase, MD as Consulting Physician (Orthopedic Surgery) Fidel Rogue, MD as Consulting Physician (Orthopedic Surgery) Default, Provider, MD as Technician  ASSESSMENT & PLAN:  Acquired pancytopenia Overall, the most likely cause of her severe pancytopenia is due to bone marrow suppression from recurrent hospitalization, poor nutrition, broad-spectrum IV antibiotics and recent infection, in the setting of untreated autoimmune disease Bone marrow biopsy is not indicated The mainstay of treatment will be supportive care with transfusion I recommend keeping transfusion threshold with giving her blood to keep hemoglobin greater than 8 g to facilitate wound healing and given recent elevated troponin She does not need platelet transfusion unless signs of bleeding or platelet count less than 10 There is no role for G-CSF for low white blood cell count  Moderate to severe protein calorie malnutrition Multiple pressure injury Due to bedbound status, poor protein status/intake I recommend frequent small meals and mobilization as tolerated  Rheumatoid arthritis I gave the patient and caregiver verbal instruction not to resume methotrexate   Mouth sores Abnormal skin rash Not sure whether this is autoimmune related or drug reaction Continue conservative management  Discharge planning Will defer to primary service She will likely need frequent monitoring of blood count in the outpatient setting after discharge  All questions were answered. The patient knows to call the clinic with any  problems, questions or concerns.  The total time spent in the appointment was 80 minutes encounter with patients including review of chart and various tests results, discussions about plan of care and coordination of care plan  [name], MD 10/29/20258:40 AM   CHIEF COMPLAINTS/PURPOSE OF CONSULTATION:  Acquired pancytopenia  HISTORY OF PRESENTING ILLNESS:  Daisa  A [place] 83 y.o. female is seen at the request by emergency room physician last night The patient is in the room accompanied by caregiver She has a very complicated protracted history of recurrent admissions to the hospital since May 2025 She has significant background history of multiple sclerosis, rheumatoid arthritis and other comorbidities The patient had a fall leading to an open wound that became infected She also have recurrent squamous cell carcinoma  Between May to October 2025, she had numerous hip surgeries.  She also had recurrent infection/sepsis and with hip infection with Pseudomonas.  She has been on broad-spectrum IV antibiotics with IV cefepime  up until October 13 and then transition to oral ciprofloxacin .  She has recurrent skin rash, weakness, pancytopenia with last hospitalization.  Osteomyelitis is also suspected.  When she was discharged from the hospital on March 12, 2024, her white count was 4.3, hemoglobin 7.1 and platelet count 246 On admission yesterday on March 22, 2024, white count was 2.3, hemoglobin 6.4 and platelet count 55.  She received a unit of blood.  Repeat CBC this morning showed white count of 3.4, hemoglobin 9.5 and platelet count of 45.  Serum chemistries show significant hypocalcemia, synthetic liver dysfunction with low serum protein and serum albumin   The patient denies any recent signs or symptoms of bleeding such as spontaneous epistaxis, hematuria or hematochezia. She has numerous decubitus ulcer and open wounds.  The most important reason why she was admitted was because of  blisters in her mouth/mouth  sores She was given 1 dose of steroids yesterday On review of her electronic records, she has been receiving blood consistently in June, July, August and yesterday  On reviewing her diet history with caregiver, in general, her oral intake is poor.  I estimated her total protein intake to be less than 50 g consistently on a daily basis.  She is also very weak and not able to participate much with home physical therapy. Despite refill for methotrexate  recently, caregiver verified that she has never resumed methotrexate  since recent hospitalization  MEDICAL HISTORY:  Past Medical History:  Diagnosis Date   Anxiety    takes Valium  daily as needed   Basal cell carcinoma 01/21/1988   left nostril (MOHS), sup-right calf (CX35FU)   Basal cell carcinoma 03/22/1996   right calf (CX35FU)   Basal cell carcinoma 03/31/1995   left wing nose   Bruises easily    d/t meds   Cancer (HCC)    basal cell ca, in situ- uterine    Cataracts, bilateral    removed bilateral   Chronic back pain    Dizziness    r/t to meds   GERD (gastroesophageal reflux disease)    no meds on a regular basis but will take Tums if needed   Headache(784.0)    r/t neck issues   History of bronchitis 6-3yrs ago   History of colon polyps    benign   HOH (hard of hearing)    wears hearing aids   HOH (hard of hearing)    wears hearing aids    Hyperlipidemia    takes Atorvastatin  on Mondays and Fridays   Hypertension    Joint pain    Joint swelling    Multiple sclerosis    Neuromuscular disorder (HCC)    Dr. FABIENE Crete- [facility], follows [facility]   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   Rheumatoid arthritis (HCC)    Dr Lorrayne weekly, RA- hands- knees- feet    Rheumatoid arthritis(714.0)    Dr Dolphus takes Xeljanz  daily    Right wrist fracture    Skin cancer    Squamous cell carcinoma of skin 11/02/2001   in situ-right knee (cx13fu)   Squamous cell carcinoma of skin 11/12/2011   in situ-left forearm (CX35FU), in situ-left foot (CX35FU)   Squamous cell carcinoma of skin 01/22/2012   in situ-left lower forearm (txpbx)   Squamous cell carcinoma of skin 11/17/2012   right shin (txpbx)   Squamous cell carcinoma of skin 10/28/2013   in situ-right shoulder (CX35FU)   Squamous cell carcinoma of skin 04/28/2014   well diff-right shin (txpbx)   Squamous cell carcinoma of skin 11/02/2014   in situ-Left shin (txpbx)   Squamous cell carcinoma of skin 12/15/2014   in situ-Left hand (txpbx), bowens-left side chest (txpbx)   Squamous cell carcinoma of skin 05/09/2015   in situ-left hand (txpbx)    Squamous cell carcinoma of skin 05/07/2016   in situ-left inner shin,ant, in situ-left inner shin, post, in situ-left outer forearm, in situ-right knuckle   Squamous cell carcinoma of skin 03/1302018   in situ-left shoulder (txpbx), in situ-right inner shin (txpbx), in situ-top of left foot (txpbx), KA- left forearm (txpbx)   Squamous cell carcinoma of skin 12/02/2016   in situ-left inner shin (txpbx)   Squamous cell carcinoma of skin 03/04/2017  in situ-left outer sup, shin (txpbx), in situ- right 2nd knuckle finger (txpbx), in situ- Left wrist (txpbx)   Squamous cell carcinoma of skin 04/06/2018   in situ-above left knee inner (txpbx)   Squamous cell carcinoma of skin 04/21/2018   in situ-right top hand (txpbx)   Squamous cell carcinoma of skin 07/08/2018   in situ-right lower inner shin (txpbx)   Squamous cell carcinoma of skin 04/27/2019   in situ- Left neck(CX35FU), In situ- right neck (CX35FU)   Urinary retention    sees Dr.Wrenn about 2 times a yr    SURGICAL HISTORY: Past Surgical History:  Procedure Laterality Date   ABDOMINAL HYSTERECTOMY     1985   ANTERIOR CERVICAL DECOMP/DISCECTOMY FUSION N/A  04/23/2022   Procedure: Anterior Cervical Decompression/Discectomy Fusion - Cervical Seven-Thoracic One;  Surgeon: Louis Shove, MD;  Location: [facility] OR;  Service: Neurosurgery;  Laterality: N/A;  3C   ANTERIOR HIP REVISION Right 01/21/2024   Procedure: REVISION, TOTAL ARTHROPLASTY, HIP, ANTERIOR APPROACH;  Surgeon: Fidel Rogue, MD;  Location: [facility];  Service: Orthopedics;  Laterality: Right;   APPENDECTOMY     with TAH   BACK SURGERY     several   BREAST BIOPSY Right    Results were negative   BROW LIFT Bilateral 10/02/2016   Procedure: BILATERAL LOWER LID BLEPHAROPLASTY;  Surgeon: Laurie Loyd Redhead, MD;  Location: [facility] OR;  Service: Plastics;  Laterality: Bilateral;   CARPAL TUNNEL RELEASE Right    cataracts     CERVICAL FUSION     Dr Leeann   CERVICAL FUSION  12/31/2012   Dr Leeann   CERVICAL FUSION  04/2022   CHEST TUBE INSERTION     for traumatic Pneumothorax   COLONOSCOPY  07/16/2010   normal    COLONOSCOPY W/ POLYPECTOMY  1997   negative since; Dr Debrah   ESOPHAGOGASTRODUODENOSCOPY  07/16/2010   normal   eye lid raise     EYE SURGERY Bilateral    cataracts removed - /w IOL   HARDWARE REMOVAL  08/2021   HIP CLOSED REDUCTION Right 11/16/2023   Procedure: CLOSED MANIPULATION, JOINT, HIP;  Surgeon: Ernie Cough, MD;  Location: [facility];  Service: Orthopedics;  Laterality: Right;   HIP CLOSED REDUCTION N/A 11/30/2023   Procedure: CLOSED REDUCTION, HIP;  Surgeon: Fidel Rogue, MD;  Location: [facility];  Service: Orthopedics;  Laterality: N/A;   INCISION AND DRAINAGE OF WOUND Right 11/28/2023   Procedure: IRRIGATION AND DEBRIDEMENT WOUND; REMOVAL OF HARDWARE;  Surgeon: Edna Toribio LABOR, MD;  Location: [facility];  Service: Orthopedics;  Laterality: Right;  I & D RIGHT ELBOW   JOINT REPLACEMENT     LUMBAR FUSION     Dr Leeann   NASAL SINUS SURGERY     ORIF ELBOW FRACTURE Right 11/20/2023   Procedure: OPEN REDUCTION INTERNAL FIXATION (ORIF) ELBOW/OLECRANON FRACTURE;  Surgeon: [name] Franky SQUIBB, MD;  Location: [facility];  Service: Orthopedics;  Laterality: Right;   POSTERIOR CERVICAL FUSION/FORAMINOTOMY N/A 12/31/2012   Procedure: POSTERIOR LATERAL CERVICAL FUSION/FORAMINOTOMY LEVEL 1 CERVICAL THREE-FOUR WITH LATERAL MASS SCREWS;  Surgeon: Catalina CHRISTELLA Leeann, MD;  Location: [facility];  Service: Neurosurgery;  Laterality: N/A;   PTOSIS REPAIR Bilateral 10/02/2016   Procedure: INTERNAL PTOSIS REPAIR;  Surgeon: Laurie Loyd Redhead, MD;  Location: [facility];  Service: Plastics;  Laterality: Bilateral;   SKIN BIOPSY     THYROID  SURGERY     R lobe removed- 1972, has grown back - CT last done- 2018  TOTAL HIP ARTHROPLASTY  12/22/2011   Procedure: TOTAL HIP ARTHROPLASTY;  Surgeon: Dempsey JINNY Sensor, MD;  Location: [facility];  Service: Orthopedics;  Laterality: Left;   TOTAL HIP ARTHROPLASTY Right 11/12/2023   Procedure: ARTHROPLASTY, HIP, TOTAL, ANTERIOR APPROACH;  Surgeon: Fidel Rogue, MD;  Location: [facility];  Service: Orthopedics;  Laterality: Right;   TOTAL HIP REVISION Right 12/03/2023   Procedure: TOTAL HIP REVISION;  Surgeon: Fidel Rogue, MD;  Location: [facility];  Service: Orthopedics;  Laterality: Right;  Right acetabular revision Anterior approach   TOTAL SHOULDER ARTHROPLASTY     TUBAL LIGATION     UPPER GASTROINTESTINAL ENDOSCOPY  2012   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   WRIST SURGERY Left 09/16/2022   wrist/arm    SOCIAL HISTORY: Social History   Socioeconomic History   Marital status: Married    Spouse name: Cheryl   Number of children: 2   Years of education: Not on file   Highest education level: Not on file  Occupational History   Occupation: retired  Tobacco Use   Smoking status: Former    Current packs/day: 0.00    Average packs/day: 2.5 packs/day for 20.0 years (50.0 ttl pk-yrs)    Types: Cigarettes    Start date: 05/26/1961    Quit date: 05/26/1981    Years since quitting: 42.8    Passive exposure: Never   Smokeless tobacco: Never   Tobacco comments:     Smoked [id number], up to 2.5 ppd  Vaping Use   Vaping status: Never Used  Substance and Sexual Activity   Alcohol  use: Yes    Alcohol /week: 14.0 standard drinks of alcohol     Types: 14 Cans of beer per week    Comment: 2% beer   Drug use: Never   Sexual activity: Yes    Birth control/protection: Surgical    Comment: Hysterectomy  Other Topics Concern   Not on file  Social History Narrative   Not on file   Social Drivers of Health   Financial Resource Strain: Low Risk  (02/21/2022)   Overall Financial Resource Strain (CARDIA)    Difficulty of Paying Living Expenses: Not hard at all  Food Insecurity: No Food Insecurity (03/23/2024)   Hunger Vital Sign    Worried About Running Out of Food in the Last Year: Never true    Ran Out of Food in the Last Year: Never true  Transportation Needs: No Transportation Needs (03/23/2024)   PRAPARE - Administrator, Civil Service (Medical): No    Lack of Transportation (Non-Medical): No  Physical Activity: Inactive (02/21/2022)   Exercise Vital Sign    Days of Exercise per Week: 0 days    Minutes of Exercise per Session: 0 min  Stress: Stress Concern Present (02/21/2022)   [employer] of Occupational Health - Occupational Stress Questionnaire    Feeling of Stress : Rather much  Social Connections: Socially Integrated (03/23/2024)   Social Connection and Isolation Panel    Frequency of Communication with Friends and Family: More than three times a week    Frequency of Social Gatherings with Friends and Family: More than three times a week    Attends Religious Services: 1 to 4 times per year    Active Member of [employer] or Organizations: Yes    Attends Banker Meetings: More than 4 times per year    Marital Status: Married  Catering Manager Violence: Not At Risk (03/23/2024)   Humiliation, Afraid, Rape, and Kick  questionnaire    Fear of Current or Ex-Partner: No    Emotionally Abused: No    Physically Abused: No     Sexually Abused: No    FAMILY HISTORY: Family History  Problem Relation Age of Onset   Cancer Mother        pancreatic   Diabetes Mother    Pancreatic cancer Mother    Heart disease Father        Rheumatic   Cancer Father        ? stomach   Stomach cancer Father    Cancer Sister        stomach   Stomach cancer Sister    Cancer Maternal Aunt        X 4; ? primary   Heart disease Paternal Aunt    Cancer Maternal Grandmother        cervical   Colon cancer Other        Aunts   Multiple sclerosis Daughter    Hypertension Neg Hx    Stroke Neg Hx    Esophageal cancer Neg Hx    Rectal cancer Neg Hx     ALLERGIES:  is allergic to darvon [propoxyphene hcl], demerol [meperidine], penicillins, imuran [azathioprine], sulfa  antibiotics, arava [leflunomide], ciprofloxacin , lipitor [atorvastatin ], and vytorin [ezetimibe-simvastatin].  MEDICATIONS:  Current Facility-Administered Medications  Medication Dose Route Frequency Provider Last Rate Last Admin   0.9 %  sodium chloride  infusion (Manually program via Guardrails IV Fluids)   Intravenous Once Kammerer, Megan L, DO       acetaminophen  (TYLENOL ) tablet 650 mg  650 mg Oral Q6H PRN Zella, Mir M, MD       Or   acetaminophen  (TYLENOL ) suppository 650 mg  650 mg Rectal Q6H PRN Zella, Mir M, MD       acidophilus (RISAQUAD) capsule 2 capsule  2 capsule Oral TID Zella Katha HERO, MD   2 capsule at 03/22/24 2133   albuterol  (PROVENTIL ) (2.5 MG/3ML) 0.083% nebulizer solution 2.5 mg  2.5 mg Nebulization Q2H PRN Zella, Mir M, MD       bisacodyl  (DULCOLAX) suppository 10 mg  10 mg Rectal Daily PRN Zella, Mir M, MD       docusate sodium  (COLACE) capsule 100 mg  100 mg Oral Daily Zella, Mir M, MD       DULoxetine  (CYMBALTA ) DR capsule 20 mg  20 mg Oral QHS Zella, Mir M, MD   20 mg at 03/22/24 2134   folic acid  (FOLVITE ) tablet 1 mg  1 mg Oral Daily Zella, Mir M, MD       furosemide  (LASIX ) tablet 10 mg  10 mg  Oral Daily PRN Zella, Mir M, MD       guaiFENesin  (ROBITUSSIN) 100 MG/5ML liquid 5 mL  5 mL Oral Q4H PRN Zella, Mir M, MD   5 mL at 03/23/24 0430   hydrOXYzine  (ATARAX ) tablet 10 mg  10 mg Oral TID PRN Zella Katha HERO, MD   10 mg at 03/23/24 0057   methylPREDNISolone  sodium succinate  (SOLU-MEDROL ) 40 mg/mL injection 40 mg  40 mg Intravenous Once Kammerer, Megan L, DO       ondansetron  (ZOFRAN ) tablet 4 mg  4 mg Oral Q6H PRN Zella, Mir M, MD       Or   ondansetron  (ZOFRAN ) injection 4 mg  4 mg Intravenous Q6H PRN Zella, Mir M, MD       pantoprazole  (PROTONIX ) EC tablet 40 mg  40 mg Oral BID AC Ikramullah,  Mir M, MD       phenol (CHLORASEPTIC) mouth spray 1 spray  1 spray Mouth/Throat PRN Zella, Mir M, MD   1 spray at 03/23/24 0430   traZODone  (DESYREL ) tablet 25 mg  25 mg Oral QHS PRN Zella Katha HERO, MD   25 mg at 03/22/24 2133    REVIEW OF SYSTEMS:  All other systems were reviewed with the patient and are negative.  PHYSICAL EXAMINATION: ECOG PERFORMANCE STATUS: 4 - Bedbound  Vitals:   03/23/24 0446 03/23/24 0700  BP: (!) 148/94 129/83  Pulse: (!) 110 (!) 108  Resp: 18 18  Temp: 99.4 F (37.4 C) 98.7 F (37.1 C)  SpO2: 96% 94%   Filed Weights   03/22/24 1851  Weight: 103 lb 6.3 oz (46.9 kg)    GENERAL:alert, no distress and comfortable. She looks thin and cachectic SKIN: Significant bruises.  Multiple sores are covered with dressing EYES: normal, conjunctiva are pink and non-injected, sclera clear OROPHARYNX: Noted blister in her mouth NECK: supple, thyroid  normal size, non-tender, without nodularity LYMPH:  no palpable lymphadenopathy in the cervical, axillary or inguinal ABDOMEN:abdomen soft, non-tender and normal bowel sounds Musculoskeletal:no cyanosis of digits and no clubbing  PSYCH: alert & oriented x 3 with fluent speech NEURO: Unable to assess due to profound weakness  RADIOGRAPHIC STUDIES: I have personally reviewed the radiological  images as listed and agreed with the findings in the report. DG Chest 1 View Result Date: 03/22/2024 CLINICAL DATA:  Shortness of breath EXAM: CHEST  1 VIEW COMPARISON:  Chest x-ray performed Oct 02, 2023 FINDINGS: Heart mediastinum are not significantly changed. No focal infiltrate, pleural effusion, or pneumothorax. IMPRESSION: 1. No plain film evidence of lobar infiltrate. Electronically Signed   By: Maude Naegeli M.D.   On: 03/22/2024 13:52

## 2024-03-23 NOTE — Plan of Care (Incomplete)
  Problem: Education: Goal: Knowledge of General Education information will improve Description: Including pain rating scale, medication(s)/side effects and non-pharmacologic comfort measures Outcome: Progressing   Problem: Health Behavior/Discharge Planning: Goal: Ability to manage health-related needs will improve Outcome: Progressing   Problem: Clinical Measurements: Goal: Ability to maintain clinical measurements within normal limits will improve Outcome: Progressing Goal: Will remain free from infection Outcome: Progressing Goal: Diagnostic test results will improve Outcome: Progressing   Problem: Activity: Goal: Risk for activity intolerance will decrease Outcome: Progressing   Problem: Clinical Measurements: Goal: Respiratory complications will improve Outcome: Adequate for Discharge Goal: Cardiovascular complication will be avoided Outcome: Adequate for Discharge

## 2024-03-23 NOTE — Consult Note (Signed)
 [facility]                                                                                        Patient Identification: Patient Name: Sherri  ESLY [name] MRN: [id number] Admit Date: 03/22/2024 12:39 PM Today's Date: 03/23/2024 Reason for consult: PJI, chronic osteomyelitis, drug allergy, rash Requesting provider: Dr. Annia  Principal Problem:   Pancytopenia [facility])   Antibiotics:  Ciprofloxacin  held since 10/28 am   Lines/Hardware:  Assessment # Recurrent blistering rash in upper and lower extremities including lips and oral cavity, concerns for Pemphigus vulgaris during admission with + Nikolsky sign vs drug rash related to ciprofloxacin   - Caregiver reports she did not have any issues while on IV cefepime . Blistering rash started few days after switching to ciprofloxacin  when got admitted 10/17-10/18. It appears although there was concerns for rash related to ciprofloxacin  but planned to continue ciprofloxacin  during discharge with close monitoring. Rash seemed to have improved by discharge. She continued taking ciprofloxacin  and started having blistering rash in her both upper and lower extremities now including lips and oral mucosa.   # Right hip PJI - s/p completion of IV cefepime  through 10/13 then transitioned to PO ciprofloxacin    # Acquired Pancytopenia - Oncology was consulted, multifactorial causes due to recurrent hospitalization, poor nutrition, spectrum antibiotics, recent infection, untreated and autoimmune disease - No plan for bone marrow biopsy - Supportive treatment with transfusion as needed  # Stage 3 sacral pressure wound  - continue wound care   # Rheumatoid arthritis- not on immunosuppressives # Multiple sclerosis - not on specific tx  Recommendations  - on methylprednisolone  currently and  defer management to primary/oncology  -No signs of acute infection  and  will hold off on starting antibiotics for today and discuss with patient/family members tomorrow regarding possible trial with PO levofloxacin as an alternative antibiotic for suppression as low risk for cross reactivity. Hopefully, some improvement in her rash before trialing. If not, then likely need PICC line and IV cefepime  which is not a preferred long term option.  - Monitor CBC  - Transfusion requirement per primary/hematology - Universal/standard isolation precautions  Following   Rest of the management as per the primary team. Please call with questions or concerns.  Thank you for the consult  __________________________________________________________________________________________________________ HPI and Hospital Course: 83 year old female with prior history as below including MS, RA, squamous cell carcinoma and basal cell carcinoma, multiple right hip surgeries following 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 ciprofloxacin  for suppression who presented to the ED on 10/28 due to concern for rash.  Rash is reported as blisters in mouth as well as in her b/l arms and legs.    She was recently admitted on 10/17-10/18 after few days of p.o. ciprofloxacin  for similar blistering rash in her upper extremities and lower extremities bilaterally but no oral mucosal involvement.  There  was concern for rash related to ciprofloxacin  but was discharged on ciprofloxacin  with advise for close monitoring.  Rash appeared to have resolved by the time of discharge.   At ED, afebrile Labs remarkable for WBC 2.3, hemoglobin 6.4, platelets 55-pancytopenic.  ANC 1.6 Influenza A/influenza B/SARS COVID 2/RSV negative Was given 1  unit PRBC Case discussed with oncology given pancytopenia/anemia with concern for pemphigus and started on steroid   ROS: General- Denies fever, chills, loss of appetite and loss of weight HEENT - Denies headache, blurry vision, neck pain, sinus pain Chest - Denies any chest pain, SOB or cough CVS- Denies any dizziness/lightheadedness, syncopal attacks, palpitations Abdomen- Denies any nausea, vomiting, abdominal pain, hematochezia and diarrhea Neuro - Denies any weakness, numbness, tingling sensation Psych - Denies any changes in mood irritability or depressive symptoms GU- Denies any burning, dysuria, hematuria or increased frequency of urination Skin - denies any rashes/lesions MSK - denies any joint pain/swelling or restricted ROM   Past Medical History:  Diagnosis Date   Anxiety    takes Valium  daily as needed   Basal cell carcinoma 01/21/1988   left nostril (MOHS), sup-right calf (CX35FU)   Basal cell carcinoma 03/22/1996   right calf (CX35FU)   Basal cell carcinoma 03/31/1995   left wing nose   Bruises easily    d/t meds   Cancer (HCC)    basal cell ca, in situ- uterine    Cataracts, bilateral    removed bilateral   Chronic back pain    Dizziness    r/t to meds   GERD (gastroesophageal reflux disease)    no meds on a regular basis but will take Tums if needed   Headache(784.0)    r/t neck issues   History of bronchitis 6-52yrs ago   History of colon polyps    benign   HOH (hard of hearing)    wears hearing aids   HOH (hard of hearing)    wears hearing aids    Hyperlipidemia    takes Atorvastatin  on Mondays and Fridays   Hypertension    Joint pain    Joint swelling    Multiple sclerosis    Neuromuscular disorder (HCC)    Dr. FABIENE Crete- [facility], follows [facility]   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    Rheumatoid arthritis (HCC)    Dr Lorrayne weekly, RA- hands- knees- feet    Rheumatoid arthritis(714.0)    Dr Dolphus takes Xeljanz  daily   Right wrist fracture    Skin cancer    Squamous cell carcinoma of skin 11/02/2001   in situ-right knee (cx72fu)   Squamous cell carcinoma of skin 11/12/2011   in situ-left forearm (CX35FU), in situ-left foot (CX35FU)   Squamous cell carcinoma of skin 01/22/2012   in situ-left lower forearm (txpbx)   Squamous cell carcinoma of skin 11/17/2012   right shin (txpbx)   Squamous cell carcinoma of skin 10/28/2013   in situ-right shoulder (CX35FU)   Squamous cell carcinoma of skin 04/28/2014   well diff-right shin (txpbx)   Squamous cell carcinoma of skin 11/02/2014   in situ-Left shin (txpbx)   Squamous cell carcinoma of skin 12/15/2014   in situ-Left hand (txpbx), bowens-left side chest (txpbx)   Squamous cell carcinoma of skin 05/09/2015   in situ-left hand (txpbx)    Squamous cell carcinoma of skin  05/07/2016   in situ-left inner shin,ant, in situ-left inner shin, post, in situ-left outer forearm, in situ-right knuckle   Squamous cell carcinoma of skin 03/1302018   in situ-left shoulder (txpbx), in situ-right inner shin (txpbx), in situ-top of left foot (txpbx), KA- left forearm (txpbx)   Squamous cell carcinoma of skin 12/02/2016   in situ-left inner shin (txpbx)   Squamous cell carcinoma of skin 03/04/2017   in situ-left outer sup, shin (txpbx), in situ- right 2nd knuckle finger (txpbx), in situ- Left wrist (txpbx)   Squamous cell carcinoma of skin 04/06/2018   in situ-above left knee inner (txpbx)   Squamous cell carcinoma of skin 04/21/2018   in situ-right top hand (txpbx)   Squamous cell carcinoma of skin 07/08/2018   in situ-right lower inner shin (txpbx)   Squamous cell carcinoma of skin 04/27/2019   in situ- Left neck(CX35FU), In situ- right neck (CX35FU)   Urinary retention    sees Dr.Wrenn about 2 times a yr   Past  Surgical History:  Procedure Laterality Date   ABDOMINAL HYSTERECTOMY     1985   ANTERIOR CERVICAL DECOMP/DISCECTOMY FUSION N/A 04/23/2022   Procedure: Anterior Cervical Decompression/Discectomy Fusion - Cervical Seven-Thoracic One;  Surgeon: Louis Shove, MD;  Location: [facility] OR;  Service: Neurosurgery;  Laterality: N/A;  3C   ANTERIOR HIP REVISION Right 01/21/2024   Procedure: REVISION, TOTAL ARTHROPLASTY, HIP, ANTERIOR APPROACH;  Surgeon: Fidel Rogue, MD;  Location: [facility];  Service: Orthopedics;  Laterality: Right;   APPENDECTOMY     with TAH   BACK SURGERY     several   BREAST BIOPSY Right    Results were negative   BROW LIFT Bilateral 10/02/2016   Procedure: BILATERAL LOWER LID BLEPHAROPLASTY;  Surgeon: Laurie Loyd Redhead, MD;  Location: [facility] OR;  Service: Plastics;  Laterality: Bilateral;   CARPAL TUNNEL RELEASE Right    cataracts     CERVICAL FUSION     Dr Leeann   CERVICAL FUSION  12/31/2012   Dr Leeann   CERVICAL FUSION  04/2022   CHEST TUBE INSERTION     for traumatic Pneumothorax   COLONOSCOPY  07/16/2010   normal    COLONOSCOPY W/ POLYPECTOMY  1997   negative since; Dr Debrah   ESOPHAGOGASTRODUODENOSCOPY  07/16/2010   normal   eye lid raise     EYE SURGERY Bilateral    cataracts removed - /w IOL   HARDWARE REMOVAL  08/2021   HIP CLOSED REDUCTION Right 11/16/2023   Procedure: CLOSED MANIPULATION, JOINT, HIP;  Surgeon: Ernie Cough, MD;  Location: [facility];  Service: Orthopedics;  Laterality: Right;   HIP CLOSED REDUCTION N/A 11/30/2023   Procedure: CLOSED REDUCTION, HIP;  Surgeon: Fidel Rogue, MD;  Location: [facility];  Service: Orthopedics;  Laterality: N/A;   INCISION AND DRAINAGE OF WOUND Right 11/28/2023   Procedure: IRRIGATION AND DEBRIDEMENT WOUND; REMOVAL OF HARDWARE;  Surgeon: Edna Toribio LABOR, MD;  Location: [facility];  Service: Orthopedics;  Laterality: Right;  I & D RIGHT ELBOW   JOINT REPLACEMENT     LUMBAR FUSION     Dr Leeann   NASAL SINUS SURGERY      ORIF ELBOW FRACTURE Right 11/20/2023   Procedure: OPEN REDUCTION INTERNAL FIXATION (ORIF) ELBOW/OLECRANON FRACTURE;  Surgeon: [name] Franky SQUIBB, MD;  Location: [facility];  Service: Orthopedics;  Laterality: Right;   POSTERIOR CERVICAL FUSION/FORAMINOTOMY N/A 12/31/2012   Procedure: POSTERIOR LATERAL CERVICAL FUSION/FORAMINOTOMY LEVEL 1 CERVICAL THREE-FOUR WITH LATERAL MASS SCREWS;  Surgeon:  Catalina CHRISTELLA Stains, MD;  Location: [facility];  Service: Neurosurgery;  Laterality: N/A;   PTOSIS REPAIR Bilateral 10/02/2016   Procedure: INTERNAL PTOSIS REPAIR;  Surgeon: Laurie Loyd Redhead, MD;  Location: [facility];  Service: Plastics;  Laterality: Bilateral;   SKIN BIOPSY     THYROID  SURGERY     R lobe removed- 1972, has grown back - CT last done- 2018   TOTAL HIP ARTHROPLASTY  12/22/2011   Procedure: TOTAL HIP ARTHROPLASTY;  Surgeon: Dempsey JINNY Sensor, MD;  Location: [facility];  Service: Orthopedics;  Laterality: Left;   TOTAL HIP ARTHROPLASTY Right 11/12/2023   Procedure: ARTHROPLASTY, HIP, TOTAL, ANTERIOR APPROACH;  Surgeon: Fidel Rogue, MD;  Location: [facility];  Service: Orthopedics;  Laterality: Right;   TOTAL HIP REVISION Right 12/03/2023   Procedure: TOTAL HIP REVISION;  Surgeon: Fidel Rogue, MD;  Location: [facility];  Service: Orthopedics;  Laterality: Right;  Right acetabular revision Anterior approach   TOTAL SHOULDER ARTHROPLASTY     TUBAL LIGATION     UPPER GASTROINTESTINAL ENDOSCOPY  2012   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   WRIST SURGERY Left 09/16/2022   wrist/arm   Scheduled Meds:  sodium chloride    Intravenous Once   acidophilus  2 capsule Oral TID   docusate sodium   100 mg Oral Daily   DULoxetine   20 mg Oral QHS   folic acid   1 mg Oral Daily   methylPREDNISolone  (SOLU-MEDROL ) injection  40 mg Intravenous Once   pantoprazole   40 mg Oral BID AC   Continuous Infusions: PRN Meds:.acetaminophen  **OR** acetaminophen , albuterol , bisacodyl , furosemide , guaiFENesin , hydrOXYzine , ondansetron  **OR**  ondansetron  (ZOFRAN ) IV, phenol, traZODone   Allergies  Allergen Reactions   Darvon [Propoxyphene Hcl] Shortness Of Breath and Other (See Comments)    ? Dose Related ? Lowered respirations greatly   Demerol [Meperidine] Shortness Of Breath and Other (See Comments)    Respiratory Distress   Penicillins Hives and Other (See Comments)    Has patient had a PCN reaction causing immediate rash, facial/tongue/throat swelling, SOB or lightheadedness with hypotension: No SEVERE RASH INVOLVING MUCUS MEMBRANES or SKIN NECROSIS: #  #  #  YES  #  #  #  Has patient had a PCN reaction that required hospitalization No Has patient had a PCN reaction occurring within the last 10 years: No.    Imuran [Azathioprine] Other (See Comments)    Weakness   Sulfa  Antibiotics Nausea Only and Other (See Comments)    States had severe indigestion and heartburn and nausea and had to stop taking   Arava [Leflunomide] Other (See Comments)    Excessive weight gain   Ciprofloxacin  Itching, Nausea Only, Rash and Other (See Comments)    Made her sick- stomach hurt. (Also)    Lipitor [Atorvastatin ] Nausea And Vomiting    Currently taking 2 times weekly - able to tolerate low dose    Vytorin [Ezetimibe-Simvastatin] Nausea And Vomiting   Social History   Socioeconomic History   Marital status: Married    Spouse name: Cheryl   Number of children: 2   Years of education: Not on file   Highest education level: Not on file  Occupational History   Occupation: retired  Tobacco Use   Smoking status: Former    Current packs/day: 0.00    Average packs/day: 2.5 packs/day for 20.0 years (50.0 ttl pk-yrs)    Types: Cigarettes    Start date: 05/26/1961    Quit date: 05/26/1981    Years since quitting:  42.8    Passive exposure: Never   Smokeless tobacco: Never   Tobacco comments:    Smoked [id number], up to 2.5 ppd  Vaping Use   Vaping status: Never Used  Substance and Sexual Activity   Alcohol  use: Yes    Alcohol /week:  14.0 standard drinks of alcohol     Types: 14 Cans of beer per week    Comment: 2% beer   Drug use: Never   Sexual activity: Yes    Birth control/protection: Surgical    Comment: Hysterectomy  Other Topics Concern   Not on file  Social History Narrative   Not on file   Social Drivers of Health   Financial Resource Strain: Low Risk  (02/21/2022)   Overall Financial Resource Strain (CARDIA)    Difficulty of Paying Living Expenses: Not hard at all  Food Insecurity: No Food Insecurity (03/23/2024)   Hunger Vital Sign    Worried About Running Out of Food in the Last Year: Never true    Ran Out of Food in the Last Year: Never true  Transportation Needs: No Transportation Needs (03/23/2024)   PRAPARE - Administrator, Civil Service (Medical): No    Lack of Transportation (Non-Medical): No  Physical Activity: Inactive (02/21/2022)   Exercise Vital Sign    Days of Exercise per Week: 0 days    Minutes of Exercise per Session: 0 min  Stress: Stress Concern Present (02/21/2022)   [employer] of Occupational Health - Occupational Stress Questionnaire    Feeling of Stress : Rather much  Social Connections: Socially Integrated (03/23/2024)   Social Connection and Isolation Panel    Frequency of Communication with Friends and Family: More than three times a week    Frequency of Social Gatherings with Friends and Family: More than three times a week    Attends Religious Services: 1 to 4 times per year    Active Member of [employer] or Organizations: Yes    Attends Engineer, Structural: More than 4 times per year    Marital Status: Married  Catering Manager Violence: Not At Risk (03/23/2024)   Humiliation, Afraid, Rape, and Kick questionnaire    Fear of Current or Ex-Partner: No    Emotionally Abused: No    Physically Abused: No    Sexually Abused: No   Family History  Problem Relation Age of Onset   Cancer Mother        pancreatic   Diabetes Mother    Pancreatic  cancer Mother    Heart disease Father        Rheumatic   Cancer Father        ? stomach   Stomach cancer Father    Cancer Sister        stomach   Stomach cancer Sister    Cancer Maternal Aunt        X 4; ? primary   Heart disease Paternal Aunt    Cancer Maternal Grandmother        cervical   Colon cancer Other        Aunts   Multiple sclerosis Daughter    Hypertension Neg Hx    Stroke Neg Hx    Esophageal cancer Neg Hx    Rectal cancer Neg Hx     Vitals BP 134/84 (BP Location: Left Arm)   Pulse (!) 106   Temp 98.7 F (37.1 C) (Oral)   Resp 16   Ht 5' 1 (1.549 m)   Wt  46.9 kg   SpO2 96%   BMI 19.54 kg/m    Physical Exam Constitutional elderly female lying in the bed, having breakfast, not in acute distress    Comments: HEENT, superficial ulcers in the bilateral oral cavity as well as in the lips  Cardiovascular:     Rate and Rhythm: Normal rate and regular rhythm.     Heart sounds: S1 and S2  Pulmonary:     Effort: Pulmonary effort is normal.     Comments: Normal breath sounds  Abdominal:     Palpations: Abdomen is soft.     Tenderness: Nondistended and nontender  Musculoskeletal:        General: No swelling or tenderness in peripheral joints Right hip surgical site with no signs of infection Stage 3 pressure wound in the sacrum, no signs  of infection     Skin:    Comments: Blistering rashes as  Left arm   RT arm , prior site of PICC   Rt thigh    Rt knee    Rt leg   Left knee      Left medial ankle    Rt foot    Neurological:     General: Awake, alert and oriented, hard of hearing  Psychiatric:        Mood and Affect: Mood normal.    Pertinent Microbiology Results for orders placed or performed during the hospital encounter of 03/22/24  Resp panel by RT-PCR (RSV, Flu A&B, Covid) Anterior Nasal Swab     Status: None   Collection Time: 03/22/24  1:42 PM   Specimen: Anterior Nasal Swab  Result Value Ref Range Status    SARS Coronavirus 2 by RT PCR NEGATIVE NEGATIVE Final    Comment: (NOTE) SARS-CoV-2 target nucleic acids are NOT DETECTED.  The SARS-CoV-2 RNA is generally detectable in upper respiratory specimens during the acute phase of infection. The lowest concentration of SARS-CoV-2 viral copies this assay can detect is 138 copies/mL. A negative result does not preclude SARS-Cov-2 infection and should not be used as the sole basis for treatment or other patient management decisions. A negative result may occur with  improper specimen collection/handling, submission of specimen other than nasopharyngeal swab, presence of viral mutation(s) within the areas targeted by this assay, and inadequate number of viral copies(<138 copies/mL). A negative result must be combined with clinical observations, patient history, and epidemiological information. The expected result is Negative.  Fact Sheet for Patients:  bloggercourse.com  Fact Sheet for Healthcare Providers:  seriousbroker.it  This test is no t yet approved or cleared by the [place]  FDA and  has been authorized for detection and/or diagnosis of SARS-CoV-2 by FDA under an Emergency Use Authorization (EUA). This EUA will remain  in effect (meaning this test can be used) for the duration of the COVID-19 declaration under Section 564(b)(1) of the Act, 21 U.S.C.section 360bbb-3(b)(1), unless the authorization is terminated  or revoked sooner.       Influenza A by PCR NEGATIVE NEGATIVE Final   Influenza B by PCR NEGATIVE NEGATIVE Final    Comment: (NOTE) The Xpert Xpress SARS-CoV-2/FLU/RSV plus assay is intended as an aid in the diagnosis of influenza from Nasopharyngeal swab specimens and should not be used as a sole basis for treatment. Nasal washings and aspirates are unacceptable for Xpert Xpress SARS-CoV-2/FLU/RSV testing.  Fact Sheet for  Patients: bloggercourse.com  Fact Sheet for Healthcare Providers: seriousbroker.it  This test is not yet approved or cleared by the [place]  [place] FDA and has been authorized for detection and/or diagnosis of SARS-CoV-2 by FDA under an Emergency Use Authorization (EUA). This EUA will remain in effect (meaning this test can be used) for the duration of the COVID-19 declaration under Section 564(b)(1) of the Act, 21 U.S.C. section 360bbb-3(b)(1), unless the authorization is terminated or revoked.     Resp Syncytial Virus by PCR NEGATIVE NEGATIVE Final    Comment: (NOTE) Fact Sheet for Patients: bloggercourse.com  Fact Sheet for Healthcare Providers: seriousbroker.it  This test is not yet approved or cleared by the [place]  FDA and has been authorized for detection and/or diagnosis of SARS-CoV-2 by FDA under an Emergency Use Authorization (EUA). This EUA will remain in effect (meaning this test can be used) for the duration of the COVID-19 declaration under Section 564(b)(1) of the Act, 21 U.S.C. section 360bbb-3(b)(1), unless the authorization is terminated or revoked.  Performed at [facility], [address] [place], [place] [address]    *Note: Due to a large number of results and/or encounters for the requested time period, some results have not been displayed. A complete set of results can be found in Results Review.   Pertinent Lab seen by me:    Latest Ref Rng & Units 03/23/2024    4:17 AM 03/22/2024    9:21 PM 03/22/2024    3:27 PM  CBC  WBC 4.0 - 10.5 K/uL 3.4     Hemoglobin 12.0 - 15.0 g/dL 9.5  8.8  8.8   Hematocrit 36.0 - 46.0 % 28.4  27.1  26.0   Platelets 150 - 400 K/uL 45         Latest Ref Rng & Units 03/23/2024    4:17 AM 03/22/2024    3:27 PM 03/22/2024    3:07 PM  CMP  Glucose 70 - 99 mg/dL 73  75  77   BUN 8 - 23 mg/dL <5   <3  <5   Creatinine 0.44 - 1.00 mg/dL 9.49  9.49  9.46   Sodium 135 - 145 mmol/L 132  134  135   Potassium 3.5 - 5.1 mmol/L 3.7  3.0  3.1   Chloride 98 - 111 mmol/L 99  96  99   CO2 22 - 32 mmol/L 27   26   Calcium  8.9 - 10.3 mg/dL 7.3   7.1   Total Protein 6.5 - 8.1 g/dL   4.9   Total Bilirubin 0.0 - 1.2 mg/dL   0.4   Alkaline Phos 38 - 126 U/L   51   AST 15 - 41 U/L   22   ALT 0 - 44 U/L   8      Pertinent Imagings/Other Imagings Plain films and CT images have been personally visualized and interpreted; radiology reports have been reviewed. Decision making incorporated into the Impression / Recommendations.  DG Chest 1 View Result Date: 03/22/2024 CLINICAL DATA:  Shortness of breath EXAM: CHEST  1 VIEW COMPARISON:  Chest x-ray performed Oct 02, 2023 FINDINGS: Heart mediastinum are not significantly changed. No focal infiltrate, pleural effusion, or pneumothorax. IMPRESSION: 1. No plain film evidence of lobar infiltrate. Electronically Signed   By: Maude Naegeli M.D.   On: 03/22/2024 13:52    I spent 85 minutes involved in face-to-face and non-face-to-face activities for this patient on the day of the visit. Professional time spent includes the following activities: Preparing to see the patient (review of tests), Obtaining and reviewing separately obtained history (ED note, H&P,  oncology note), Performing a medically appropriate examination and evaluation referring and communicating with other health care professionals, Documenting clinical information in the EMR, Independently interpreting results (not separately reported), Communicating results to the patient/caregiver, counseling and educating the patient/care giver and Care coordination (not separately reported).  Electronically signed by:   Plan d/w requesting provider as well as ID pharm D  Of note, portions of this note may have been created with voice recognition software. While this note has been edited for accuracy, occasional  wrong-word or 'sound-a-like' substitutions may have occurred due to the inherent limitations of voice recognition software.   Annalee Orem, MD Infectious Disease Physician [facility] Pager: [phone]

## 2024-03-23 NOTE — Progress Notes (Signed)
 Progress Note   Patient: Sherri  [name]:[id number] DOB: 08/11/1940 DOA: 03/22/2024     1 DOS: the patient was seen and examined on 03/23/2024   Brief hospital course: HPI on admission: Sherri [name] is a 83 y.o. female with medical history significant for rheumatoid arthritis, MS, hyperlipidemia, multiple orthopedic surgeries including right hip arthroplasty who has undergone surgical revision of her right hip arthroplasty, has chronic osteomyelitis and is now being admitted to the hospital with recurrent rash concerning for drug reaction.  She was discharged from [facility] on 10/18 after being admitted for itchy rash on her extremities.  Her primary caregiver is at the bedside and assists significantly with providing the history.  It seems that the patient was on IV cefepime  via PICC line several weeks in the past, this was transitioned to p.o. ciprofloxacin .  She developed itchy rash over her entire body similar to the one she has now, when she started the ciprofloxacin  and was admitted to [facility].  While in the hospital, it seems the p.o. ciprofloxacin  was held and the rash completely resolved.  She resumed the ciprofloxacin  a couple weeks ago when she left the hospital, and the rash that we see now recurred.  On evaluation today in the ER, she also has pancytopenia.  1 unit PRBC has been ordered.    See H&P for full HPI on admission & ED course.  Consults: Hemetology/Oncology Infectious Disease   Assessment and Plan:  Rash and Oral Ulcers -- unclear etiology, but given this is 2nd time rash after taking ciprofloxacin , drug reaction highly suspected.  Rash may also be related to autoimmune disease (pemphigus vulgaris) but time course would definitely favor ciprofloxacin  reaction. 10/29 - rash improved, mouth pain severe --ID consulted for alternative antibiotic regimen --Avoid Cipro  --Given IV Solu-Medrol  in the ED --Supportive care for symptom management --add viscous  lidocaine  --PRN hydroxyzine    Chronic osteomyelitis-requested ID consultation for antibiotic management recommendations.  Avoid Cipro  --Appreciate ID input   Acute anemia superimposed on anemia of chronic disease, no evidence of blood loss. -Transfused 1 unit PRBC for initial Hbg 6.4 Hbg today 9.5 -Goal hemoglobin greater than 7   Pancytopenia-unclear etiology, potentially related to drug reaction, autoimmune condition related to her RA, etc. --Appreciate Heme-Oncology consult --Monitor CBC --Transfusions of RBC's or platelets as needed   Elevated troponin- demand ischemia in setting of severe anemia EKG no acute ischemic changes, patient denies any chest pain.  Potentially related to heart strain from her significant anemia? -Trend troponin -Monitor on telemetry      Subjective: Pt seen with caregiver at bedside this AM, pt awake resting in bed. She reports severe mouth pain, not tolerating PO intake.  Denies itching from rash, feels that is better.  No hip pain or other acute complaints.  Physical Exam: Vitals:   03/23/24 0048 03/23/24 0446 03/23/24 0700 03/23/24 1215  BP: (!) 153/99 (!) 148/94 129/83 134/84  Pulse: (!) 104 (!) 110 (!) 108 (!) 106  Resp: 16 18 18 16   Temp: 99 F (37.2 C) 99.4 F (37.4 C) 98.7 F (37.1 C) 98.7 F (37.1 C)  TempSrc:  Oral Oral Oral  SpO2: 94% 96% 94% 96%  Weight:      Height:       General exam: awake, alert, no acute distress HEENT: moist mucus membranes, hearing grossly normal  Respiratory system: CTAB, no wheezes, rales or rhonchi, normal respiratory effort. Cardiovascular system: normal S1/S2, RRR, no JVD, murmurs, rubs, gallops, no pedal edema.  Gastrointestinal system: soft, NT, ND, no HSM felt, +bowel sounds. Central nervous system: A&O x 2+. no gross focal neurologic deficits, normal speech Skin: maculopapular rash appears improved compared to images taken yesterday Psychiatry: normal mood, congruent affect, judgement and  insight appear normal  Data Reviewed:  Notable labs --   Na 132 BUN < 5 Ca 7.3 WBC 3.4 Hbg 9.5 from 8.8 improving Platelets 45k   Family Communication: caregiver at bedside  Disposition: Status is: Inpatient Closely monitoring CBC for stable/improving pancytopenia, and improvement in rash and oral ulcer pain currently not tolerating PO intake due to pain   Planned Discharge Destination: Home    Time spent: 45 minutes  Author: Burnard DELENA Cunning, DO 03/23/2024 4:30 PM  For on call review [email or web address].

## 2024-03-23 NOTE — Evaluation (Addendum)
 SLP Cancellation Note  Patient Details Name: Sherri [name] MRN: [id number] DOB: 29-Apr-1941   Cancelled treatment:       Reason Eval/Treat Not Completed: Other (comment) (3rd time attempting to see pt, helped to position her upright in bed, and then requested assistance for pt to use bedpan for BM per pt request, called NT phone -no answer) Informed other nurse tech of pt's needs.   Will continue efforts.    This is the 3rd attempt to see the pt for evaluation.  First attempt, pt receiving care from RN, 2nd visit MD in room and 3rd visit, pt needed to have BM.    Madelin POUR, MS [facility] SLP Acute Rehab Services Office [phone]   Nicolas Emmie Caldron 03/23/2024, 11:55 AM

## 2024-03-23 NOTE — Consult Note (Signed)
 WOC Nurse Consult Note: Reason for Consult: multiple wounds  Wound type: 1.  Full thickness L upper arm r/t trauma pink moist; full thickness R upper arm r/t trauma pink moist;  2.  Stage 3 Pressure Injury sacrum 2 open areas separated by island of intact skin; superior 50% red 50% yellow inferior 100% red  3.  Partial thickness L and R lower leg r/t trauma vs venous insufficiency pink moist  4.  R hip surgical incision with sutures post R total hip arthroplasty  Pressure Injury POA: Yes Measurement: see nursing flowsheet  Wound bed: as above  Drainage (amount, consistency, odor) see nursing flowsheet  Periwound: erythema surrounding sacral wound; legs with dark discoloration and scattered ecchymosis and scabs  Dressing procedure/placement/frequency:  Cleanse B upper arm skin tears with NS, apply Vaseline gauze (Lawson #[id number]) to wound beds daily and secure with silicone foam or Kerlix roll gauze whichever is preferred.  SOAK DRESSING IN NS IF ADHERED TO WOUND BED FOR ATRAUMATIC REMOVAL.  Cleanse B lower leg skin tears with NS, apply Xeroform gauze (Lawson [phone]) to wound beds daily and secure with Kerlix roll gauze beginning right above toes and ending right below knees.  Apply Ace bandage wrapped in same fashion as Kerlix for light compression.  SOAK DRESSING IN NS IF ADHERED TO WOUND BED FOR ATRAUMATIC REMOVAL.  Cleanse sacral wound with Vashe, apply a small piece of silver hydrofiber Soila [phone]) cut to fit wound bed daily and secure with silicone foam.   Apply Xeroform and foam dressing to R hip, change every other day.    POC discussed with bedside nurse. WOC team will not follow.    Patient has rash from apparent drug reaction.    Thank you,    Powell Bar MSN, RN-BC, [employer]

## 2024-03-24 DIAGNOSIS — G35D Multiple sclerosis, unspecified: Secondary | ICD-10-CM | POA: Diagnosis not present

## 2024-03-24 DIAGNOSIS — D61818 Other pancytopenia: Secondary | ICD-10-CM | POA: Diagnosis not present

## 2024-03-24 DIAGNOSIS — R21 Rash and other nonspecific skin eruption: Secondary | ICD-10-CM | POA: Diagnosis not present

## 2024-03-24 DIAGNOSIS — M069 Rheumatoid arthritis, unspecified: Secondary | ICD-10-CM | POA: Diagnosis not present

## 2024-03-24 LAB — CBC
HCT: 28.5 % — ABNORMAL LOW (ref 36.0–46.0)
Hemoglobin: 9.2 g/dL — ABNORMAL LOW (ref 12.0–15.0)
MCH: 29.1 pg (ref 26.0–34.0)
MCHC: 32.3 g/dL (ref 30.0–36.0)
MCV: 90.2 fL (ref 80.0–100.0)
Platelets: 33 K/uL — ABNORMAL LOW (ref 150–400)
RBC: 3.16 MIL/uL — ABNORMAL LOW (ref 3.87–5.11)
RDW: 20.3 % — ABNORMAL HIGH (ref 11.5–15.5)
WBC: 2.8 K/uL — ABNORMAL LOW (ref 4.0–10.5)
nRBC: 0 % (ref 0.0–0.2)

## 2024-03-24 LAB — BASIC METABOLIC PANEL WITH GFR
Anion gap: 12 (ref 5–15)
BUN: <5 mg/dL — ABNORMAL LOW (ref 8–23)
CO2: 23 mmol/L (ref 22–32)
Calcium: 7.3 mg/dL — ABNORMAL LOW (ref 8.9–10.3)
Chloride: 98 mmol/L (ref 98–111)
Creatinine, Ser: 0.46 mg/dL (ref 0.44–1.00)
GFR, Estimated: >60 mL/min (ref >60)
Glucose, Bld: 55 mg/dL — ABNORMAL LOW (ref 70–99)
Potassium: 3.4 mmol/L — ABNORMAL LOW (ref 3.5–5.1)
Sodium: 133 mmol/L — ABNORMAL LOW (ref 135–145)

## 2024-03-24 LAB — GLUCOSE, CAPILLARY
Glucose-Capillary: 90 mg/dL (ref 70–99)
Glucose-Capillary: 93 mg/dL (ref 70–99)
Glucose-Capillary: 184 mg/dL — ABNORMAL HIGH (ref 70–99)
Glucose-Capillary: 212 mg/dL — ABNORMAL HIGH (ref 70–99)

## 2024-03-24 LAB — MAGNESIUM: Magnesium: 1.5 mg/dL — ABNORMAL LOW (ref 1.7–2.4)

## 2024-03-24 MED ORDER — CALCIUM GLUCONATE-NACL 1-0.675 GM/50ML-% IV SOLN
1.0000 g | Freq: Once | INTRAVENOUS | Status: AC
  Administered 2024-03-24: 1000 mg via INTRAVENOUS
  Filled 2024-03-24: qty 50

## 2024-03-24 MED ORDER — MAGNESIUM SULFATE 4 GM/100ML IV SOLN
4.0000 g | Freq: Once | INTRAVENOUS | Status: AC
  Administered 2024-03-24: 4 g via INTRAVENOUS
  Filled 2024-03-24: qty 100

## 2024-03-24 MED ORDER — LEVOFLOXACIN 25 MG/ML PO SOLN
250.0000 mg | Freq: Every day | ORAL | Status: DC
  Administered 2024-03-24 – 2024-03-25 (×2): 250 mg via ORAL
  Filled 2024-03-24 (×2): qty 10

## 2024-03-24 MED ORDER — PREDNISONE 20 MG PO TABS
40.0000 mg | ORAL_TABLET | Freq: Every day | ORAL | Status: DC
Start: 2024-03-24 — End: 2024-03-28
  Administered 2024-03-24 – 2024-03-27 (×4): 40 mg via ORAL
  Filled 2024-03-24 (×4): qty 2

## 2024-03-24 MED ORDER — CYCLOBENZAPRINE HCL 10 MG PO TABS
10.0000 mg | ORAL_TABLET | Freq: Three times a day (TID) | ORAL | Status: DC | PRN
Start: 2024-03-24 — End: 2024-03-29
  Administered 2024-03-28: 10 mg via ORAL
  Filled 2024-03-24: qty 1

## 2024-03-24 MED ORDER — POTASSIUM CHLORIDE 2 MEQ/ML IV SOLN
INTRAVENOUS | Status: DC
  Filled 2024-03-24 (×3): qty 1000

## 2024-03-24 MED ORDER — FOOD THICKENER (SIMPLYTHICK): 1.0000 | ORAL | Status: DC | PRN

## 2024-03-24 MED ORDER — OXYCODONE HCL 5 MG PO TABS
5.0000 mg | ORAL_TABLET | ORAL | Status: DC | PRN
  Administered 2024-03-24 – 2024-03-28 (×6): 5 mg via ORAL
  Filled 2024-03-24 (×6): qty 1

## 2024-03-24 MED ORDER — TAMSULOSIN HCL 0.4 MG PO CAPS
0.4000 mg | ORAL_CAPSULE | Freq: Every day | ORAL | Status: DC
  Administered 2024-03-24 – 2024-03-28 (×5): 0.4 mg via ORAL
  Filled 2024-03-24 (×5): qty 1

## 2024-03-24 MED ORDER — GERHARDT'S BUTT CREAM
1.0000 | TOPICAL_CREAM | CUTANEOUS | Status: DC
  Administered 2024-03-28: 1 via TOPICAL
  Filled 2024-03-24: qty 60

## 2024-03-24 MED ORDER — DIAZEPAM 5 MG PO TABS: 5.0000 mg | ORAL_TABLET | Freq: Every evening | ORAL | Status: DC | PRN

## 2024-03-24 MED ORDER — POTASSIUM CHLORIDE 10 MEQ/100ML IV SOLN
10.0000 meq | INTRAVENOUS | Status: AC
  Administered 2024-03-24 (×2): 10 meq via INTRAVENOUS
  Filled 2024-03-24: qty 100

## 2024-03-24 MED ORDER — PREDNISONE 5 MG PO TABS: 5.0000 mg | ORAL_TABLET | Freq: Every day | ORAL | Status: DC

## 2024-03-24 NOTE — Progress Notes (Addendum)
 Progress Note   Patient: Garland  A Hyslop FMW:[id number] DOB: 05/12/1941 DOA: 03/22/2024     2 DOS: the patient was seen and examined on 03/24/2024   Brief hospital course: HPI on admission: Mazzy  A Serena is a 83 y.o. female with medical history significant for rheumatoid arthritis, MS, hyperlipidemia, multiple orthopedic surgeries including right hip arthroplasty who has undergone surgical revision of her right hip arthroplasty, has chronic osteomyelitis and is now being admitted to the hospital with recurrent rash concerning for drug reaction.  She was discharged from [facility] on 10/18 after being admitted for itchy rash on her extremities.  Her primary caregiver is at the bedside and assists significantly with providing the history.  It seems that the patient was on IV cefepime  via PICC line several weeks in the past, this was transitioned to p.o. ciprofloxacin .  She developed itchy rash over her entire body similar to the one she has now, when she started the ciprofloxacin  and was admitted to [facility].  While in the hospital, it seems the p.o. ciprofloxacin  was held and the rash completely resolved.  She resumed the ciprofloxacin  a couple weeks ago when she left the hospital, and the rash that we see now recurred.  On evaluation today in the ER, she also has pancytopenia.  1 unit PRBC has been ordered.    See H&P for full HPI on admission & ED course.  Consults: Hemetology/Oncology Infectious Disease   Assessment and Plan:  Rash and Oral Ulcers -- unclear etiology, but given this is 2nd time rash after taking ciprofloxacin , drug reaction highly suspected.  Rash may also be related to autoimmune disease (pemphigus vulgaris) but time course would definitely favor ciprofloxacin  reaction. 10/29 - rash improved, mouth pain severe --ID consulted for alternative antibiotic regimen --Avoid Cipro  --ID considering trial of Levaquin once patient's rash is improved --Given IV Solu-Medrol  in the  ED, now on prednisone  40 mg --Supportive care for symptom management -- Continue viscous lidocaine  --PRN hydroxyzine   -- Check HSV PCR --consider empiric Valtrex  while awaiting results  Hypokalemia -K3.4 Hypomagnesemia -Mg 1.5 Hypocalcemia -- Replacing Ca, K and Mg today -- Monitor and replace electrolytes  Chronic osteomyelitis-requested ID consultation for antibiotic management recommendations.  Avoid Cipro  --Appreciate ID input   Acute anemia superimposed on anemia of chronic disease, no evidence of blood loss. -Transfused 1 unit PRBC for initial Hbg 6.4 Hbg today 9.5 -Goal hemoglobin greater than 7   Pancytopenia-unclear etiology, potentially related to drug reaction, autoimmune condition related to her RA, etc. --Appreciate Heme-Oncology consult --Monitor CBC --Transfusions of RBC's or platelets as needed   Elevated troponin- demand ischemia in setting of severe anemia EKG no acute ischemic changes, patient denies any chest pain.  Potentially related to heart strain from her significant anemia? -Trend troponin -Monitor on telemetry  Rheumatoid arthritis --appears stable without flare On methotrexate  and 5 mg prednisone . -- Methotrexate  on hold with cytopenias --Increased prednisone  to 40 mg for now      Subjective: Pt seen with daughter and caregiver at bedside this morning.  Patient resting comfortably but does wake to voice.  She reports ongoing severe mouth pain.  Denies itching from the rash.  Family report p.o. intake does remain very poor.  They report her mouth pain had been going on before the rash started, was on Magic mouthwash with lidocaine  that did not seem to improve things very much.  Patient says the viscous lidocaine  here is helpful.  Daughter is not aware of any prior HSV fever  blisters or cold sores but patient does have history of shingles in the mouth.   Physical Exam: Vitals:   03/23/24 2106 03/23/24 2120 03/24/24 0439 03/24/24 1351  BP: (!)  160/77 (!) 151/81 (!) 156/86 (!) 146/80  Pulse: (!) 102  (!) 107 (!) 107  Resp: 16  15 20   Temp: 98.6 F (37 C)  98.7 F (37.1 C) 98.3 F (36.8 C)  TempSrc: Oral  Oral Oral  SpO2: 97%  95% 97%  Weight:      Height:       General exam: Sleeping comfortably, wakes to voice, no acute distress, frail HEENT: Few visible ulcerations in the oropharynx and lips, dry mucus membranes, hearing grossly normal, face is flushed Respiratory system: CTAB, no wheezes, rales or rhonchi, normal respiratory effort. Cardiovascular system: normal S1/S2, RRR, no JVD, murmurs, rubs, gallops, no pedal edema.   Gastrointestinal system: soft, NT, ND Central nervous system: A&O x 2+. no gross focal neurologic deficits, normal speech Skin: Facial flushing, purpuric rash over her chest, shoulders appears similar to yesterday and seems to be receding Psychiatry: normal mood, congruent affect  Data Reviewed:  Notable labs --   Na 133 BUN < 5 Ca 7.3  WBC 3.4 >> 2.8 Hbg 9.5>> 9.2 stable Platelets 45 >> 3 k   Family Communication: Daughter and caregiver at bedside  Disposition: Status is: Inpatient Closely monitoring CBC for stable/improving pancytopenia, and improvement in rash and oral ulcer pain currently not tolerating PO intake due to pain   Planned Discharge Destination: Home    Time spent: 45 minutes  Author: Burnard DELENA Cunning, DO 03/24/2024 4:05 PM  For on call review [email or web address].

## 2024-03-24 NOTE — Evaluation (Signed)
 Clinical/Bedside Swallow Evaluation Patient Details  Name: Sherri [name]  JOHN VASCONCELOS MRN: [id number] Date of Birth: [birth year]  Today's Date: 82/30/2025 Time: SLP Start Time (ACUTE ONLY): 0750 SLP Stop Time (ACUTE ONLY): 0830 SLP Time Calculation (min) (ACUTE ONLY): 40 min  Past Medical History:  Past Medical History:  Diagnosis Date   Anxiety    takes Valium  daily as needed   Basal cell carcinoma 01/21/1988   left nostril (MOHS), sup-right calf (CX35FU)   Basal cell carcinoma 03/22/1996   right calf (CX35FU)   Basal cell carcinoma 03/31/1995   left wing nose   Bruises easily    d/t meds   Cancer (HCC)    basal cell ca, in situ- uterine    Cataracts, bilateral    removed bilateral   Chronic back pain    Dizziness    r/t to meds   GERD (gastroesophageal reflux disease)    no meds on a regular basis but will take Tums if needed   Headache(784.0)    r/t neck issues   History of bronchitis 6-68yrs ago   History of colon polyps    benign   HOH (hard of hearing)    wears hearing aids   HOH (hard of hearing)    wears hearing aids    Hyperlipidemia    takes Atorvastatin  on Mondays and Fridays   Hypertension    Joint pain    Joint swelling    Multiple sclerosis    Neuromuscular disorder (HCC)    Dr. FABIENE Crete- [facility], follows [facility]   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   Rheumatoid arthritis (HCC)    Dr Lorrayne weekly, RA- hands- knees- feet    Rheumatoid arthritis(714.0)    Dr Dolphus takes Xeljanz  daily   Right wrist fracture    Skin cancer    Squamous cell carcinoma of skin 11/02/2001   in situ-right knee (cx21fu)   Squamous cell carcinoma of skin 11/12/2011   in situ-left forearm (CX35FU), in situ-left foot (CX35FU)   Squamous cell carcinoma of skin 01/22/2012   in situ-left lower forearm (txpbx)    Squamous cell carcinoma of skin 11/17/2012   right shin (txpbx)   Squamous cell carcinoma of skin 10/28/2013   in situ-right shoulder (CX35FU)   Squamous cell carcinoma of skin 04/28/2014   well diff-right shin (txpbx)   Squamous cell carcinoma of skin 11/02/2014   in situ-Left shin (txpbx)   Squamous cell carcinoma of skin 12/15/2014   in situ-Left hand (txpbx), bowens-left side chest (txpbx)   Squamous cell carcinoma of skin 05/09/2015   in situ-left hand (txpbx)    Squamous cell carcinoma of skin 05/07/2016   in situ-left inner shin,ant, in situ-left inner shin, post, in situ-left outer forearm, in situ-right knuckle   Squamous cell carcinoma of skin 03/1302018   in situ-left shoulder (txpbx), in situ-right inner shin (txpbx), in situ-top of left foot (txpbx), KA- left forearm (txpbx)   Squamous cell carcinoma of skin 12/02/2016   in situ-left inner shin (txpbx)   Squamous cell carcinoma of skin 03/04/2017   in situ-left outer sup, shin (txpbx), in situ- right 2nd knuckle finger (txpbx), in situ- Left wrist (txpbx)   Squamous cell carcinoma of skin 04/06/2018   in situ-above left knee inner (txpbx)   Squamous cell carcinoma of skin 04/21/2018   in situ-right top hand (  txpbx)   Squamous cell carcinoma of skin 07/08/2018   in situ-right lower inner shin (txpbx)   Squamous cell carcinoma of skin 04/27/2019   in situ- Left neck(CX35FU), In situ- right neck (CX35FU)   Urinary retention    sees Dr.Wrenn about 2 times a yr   Past Surgical History:  Past Surgical History:  Procedure Laterality Date   ABDOMINAL HYSTERECTOMY     1985   ANTERIOR CERVICAL DECOMP/DISCECTOMY FUSION N/A 04/23/2022   Procedure: Anterior Cervical Decompression/Discectomy Fusion - Cervical Seven-Thoracic One;  Surgeon: Louis Shove, MD;  Location: [facility] OR;  Service: Neurosurgery;  Laterality: N/A;  3C   ANTERIOR HIP REVISION Right 01/21/2024   Procedure: REVISION, TOTAL ARTHROPLASTY, HIP, ANTERIOR APPROACH;   Surgeon: Fidel Rogue, MD;  Location: [facility] ORS;  Service: Orthopedics;  Laterality: Right;   APPENDECTOMY     with TAH   BACK SURGERY     several   BREAST BIOPSY Right    Results were negative   BROW LIFT Bilateral 10/02/2016   Procedure: BILATERAL LOWER LID BLEPHAROPLASTY;  Surgeon: Laurie Loyd Redhead, MD;  Location: [facility] OR;  Service: Plastics;  Laterality: Bilateral;   CARPAL TUNNEL RELEASE Right    cataracts     CERVICAL FUSION     Dr Leeann   CERVICAL FUSION  12/31/2012   Dr Leeann   CERVICAL FUSION  04/2022   CHEST TUBE INSERTION     for traumatic Pneumothorax   COLONOSCOPY  07/16/2010   normal    COLONOSCOPY W/ POLYPECTOMY  1997   negative since; Dr Debrah   ESOPHAGOGASTRODUODENOSCOPY  07/16/2010   normal   eye lid raise     EYE SURGERY Bilateral    cataracts removed - /w IOL   HARDWARE REMOVAL  08/2021   HIP CLOSED REDUCTION Right 11/16/2023   Procedure: CLOSED MANIPULATION, JOINT, HIP;  Surgeon: Ernie Cough, MD;  Location: [facility];  Service: Orthopedics;  Laterality: Right;   HIP CLOSED REDUCTION N/A 11/30/2023   Procedure: CLOSED REDUCTION, HIP;  Surgeon: Fidel Rogue, MD;  Location: [facility] OR;  Service: Orthopedics;  Laterality: N/A;   INCISION AND DRAINAGE OF WOUND Right 11/28/2023   Procedure: IRRIGATION AND DEBRIDEMENT WOUND; REMOVAL OF HARDWARE;  Surgeon: Edna Toribio LABOR, MD;  Location: [facility];  Service: Orthopedics;  Laterality: Right;  I & D RIGHT ELBOW   JOINT REPLACEMENT     LUMBAR FUSION     Dr Leeann   NASAL SINUS SURGERY     ORIF ELBOW FRACTURE Right 11/20/2023   Procedure: OPEN REDUCTION INTERNAL FIXATION (ORIF) ELBOW/OLECRANON FRACTURE;  Surgeon: [name] Franky SQUIBB, MD;  Location: [facility];  Service: Orthopedics;  Laterality: Right;   POSTERIOR CERVICAL FUSION/FORAMINOTOMY N/A 12/31/2012   Procedure: POSTERIOR LATERAL CERVICAL FUSION/FORAMINOTOMY LEVEL 1 CERVICAL THREE-FOUR WITH LATERAL MASS SCREWS;  Surgeon: Catalina CHRISTELLA Leeann, MD;  Location: [facility];   Service: Neurosurgery;  Laterality: N/A;   PTOSIS REPAIR Bilateral 10/02/2016   Procedure: INTERNAL PTOSIS REPAIR;  Surgeon: Laurie Loyd Redhead, MD;  Location: [facility];  Service: Plastics;  Laterality: Bilateral;   SKIN BIOPSY     THYROID  SURGERY     R lobe removed- 1972, has grown back - CT last done- 2018   TOTAL HIP ARTHROPLASTY  12/22/2011   Procedure: TOTAL HIP ARTHROPLASTY;  Surgeon: Dempsey JINNY Sensor, MD;  Location: [facility] OR;  Service: Orthopedics;  Laterality: Left;   TOTAL HIP ARTHROPLASTY Right 11/12/2023   Procedure: ARTHROPLASTY, HIP, TOTAL, ANTERIOR APPROACH;  Surgeon: Fidel Rogue,  MD;  Location: [facility];  Service: Orthopedics;  Laterality: Right;   TOTAL HIP REVISION Right 12/03/2023   Procedure: TOTAL HIP REVISION;  Surgeon: Fidel Rogue, MD;  Location: [facility];  Service: Orthopedics;  Laterality: Right;  Right acetabular revision Anterior approach   TOTAL SHOULDER ARTHROPLASTY     TUBAL LIGATION     UPPER GASTROINTESTINAL ENDOSCOPY  2012   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   WRIST SURGERY Left 09/16/2022   wrist/arm   HPI:  83 yo female with h/o RA, MS, dysphagia, constipation, cervical spine surgery, recent hip and arm fx, s/p surgery adm with blisters on LE and in oral cavity.  CXR negative.  Swallow eval uation ordered.  She reports dysphagia since her spine surgery.  Pt  has undergone prior MBS summer of 2025 when in hospital with hip fx.  At that time, it was advised nectar thick liquids, IDDSI 5 solids, multiple swallow and coughing, RMST, water  protocol and BOT and pharyngeal stregthening.  Pt reports she has been coughing with intake for the last year - States now her weight has stablized and denies needing heimilch manuever.   Admits to coughing more with liquids than solids.  Has not had pneumonias and eats soft foods at home.    Assessment / Plan / Recommendation  Clinical Impression  Pt continues with clinical s/s of dysphagia and aspiration but she does have a strong  cough and expectoration ability which has benefited her to tolerate aspiration.  Pt declines to consume thickened liquids, stating they make her gag; caregiver reports that pt will drink nectar thickened lemon water . Arranged with RD for pt to receive lemon packets with every meal for caregiver to thicken pt's water .    Currently her oral blisters are worsening her baseline dysphagia but pt denies discomfort with po at this time.  Her caregiver, Erle, is ordering her meals and helping pt to eat.  Provided precautions, compensations gleaned helpful on prior MBS 11/2023 in writing and advised pt order an emergent anti-choking apparatus *I.e. Life Vac*.  Continue diet as tolerated with strict precautions and HH follow up SLP for RMST to maintain cough and expectoration strength. SLP Visit Diagnosis: Dysphagia, oropharyngeal phase (R13.12)    Aspiration Risk  Moderate aspiration risk;Risk for inadequate nutrition/hydration    Diet Recommendation Dysphagia 3 (Mech soft);Thin liquid    Liquid Administration via: Straw;Cup Medication Administration: Other (Comment) Supervision: Staff to assist with self feeding Compensations: Slow rate;Small sips/bites;Follow solids with liquid;Multiple dry swallows after each bite/sip;Hard cough after swallow Postural Changes: Seated upright at 90 degrees;Remain upright for at least 30 minutes after po intake    Other  Recommendations Oral Care Recommendations: Oral care BID     Assistance Recommended at Discharge  Full - has caregivers  Functional Status Assessment Patient has not had a recent decline in their functional status  Frequency and Duration            Prognosis        Swallow Study   General HPI: 83 yo female with h/o RA, MS, dysphagia, constipation, cervical spine surgery, recent hip and arm fx, s/p surgery adm with blisters on LE and in oral cavity.  CXR negative.  Swallow eval uation ordered.  She reports dysphagia since her spine surgery.  Pt   has undergone prior MBS summer of 2025 when in hospital with hip fx.  At that time, it was advised nectar thick liquids, IDDSI 5 solids, multiple swallow and coughing,  RMST, water  protocol and BOT and pharyngeal stregthening.  Pt reports she has been coughing with intake for the last year - States now her weight has stablized and denies needing heimilch manuever.   Admits to coughing more with liquids than solids.  Has not had pneumonias and eats soft foods at home. Type of Study: Bedside Swallow Evaluation Previous Swallow Assessment: see HPI Diet Prior to this Study: Regular;Thin liquids (Level 0) Temperature Spikes Noted: No Respiratory Status: Room air History of Recent Intubation: No Behavior/Cognition: Alert;Cooperative;Pleasant mood Oral Cavity Assessment: Lesions;Other (comment) (blisters) Oral Care Completed by SLP: No Oral Cavity - Dentition: Adequate natural dentition Vision: Functional for self-feeding Self-Feeding Abilities: Able to feed self Patient Positioning: Upright in bed Baseline Vocal Quality: Normal Volitional Cough: Strong Volitional Swallow: Able to elicit    Oral/Motor/Sensory Function Overall Oral Motor/Sensory Function: Generalized oral weakness   Ice Chips Ice chips: Not tested   Thin Liquid Thin Liquid: Impaired Presentation: Self Fed;Straw Pharyngeal  Phase Impairments: Throat Clearing - Immediate;Cough - Delayed;Multiple swallows;Cough - Immediate    Nectar Thick Nectar Thick Liquid: Not tested (pt states she will not use thickener - as it makes her gag - caregiver reports she will use this in lemon flavored water  as it makes her cough less)   Honey Thick Honey Thick Liquid: Not tested   Puree Puree: Not tested   Solid     Solid: Impaired Presentation: Self Fed;Spoon Oral Phase Impairments: Reduced lingual movement/coordination;Impaired mastication Pharyngeal Phase Impairments: Suspected delayed Swallow;Throat Clearing - Immediate;Multiple swallows       Nicolas Emmie Caldron 03/24/2024,10:25 AM  Madelin POUR, MS [facility] SLP Acute Rehab Services Office [phone]

## 2024-03-24 NOTE — Progress Notes (Signed)
 SLP swallow evaluation completed. Pt continues with clinical s/s of dysphagia and aspiration but she does have a strong cough and expectoration ability which has benefited her to tolerate aspiration.  Pt declines to consume thickened liquids, stating they make her gag, caregiver reports that pt will drink nectar thickened lemon water .    Currently her oral blisters are worsening her baseline dysphagia but pt denies discomfort with po.  Her caregiver, Erle, is ordering her meals and helping pt to eat.  Provided precautions, compensations gleaned helpful on prior MBS 11/2023 in writing and advised pt order an emergent anti-choking apparatus *I.e. Life Vac*.  Continue diet as tolerated with strict precautions and HH follow up SLP for RMST to maintain cough and expectoration strength.    Madelin POUR, MS [facility] SLP Acute [employer] [phone]

## 2024-03-24 NOTE — Progress Notes (Signed)
 [facility] Infectious Diseases Follow Up Note  Patient Identification: Patient Name: Sherri [name]  LAMYIAH CRAWSHAW MRN: [id number] Admit Date: 03/22/2024 12:39 PM Age: 83 y.o.Today's Date: 03/24/2024  Reason for Visit: rash, PJI   Principal Problem:   Pancytopenia (HCC)  Antibiotics:  Ciprofloxacin  held since 10/28 am    Lines/Hardware:  Interval Events: Remains afebrile Labs remarkable for K3.4, Mg 1.5, WBC 2.8, hemoglobin 9.2, platelets 33   Assessment 83 year old female with prior history as below including MS, RA, squamous cell carcinoma and basal cell carcinoma, multiple right hip surgeries following 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 ciprofloxacin  for suppression who presented to the ED on 10/28 due to concern for rash.  Rash is reported as blisters in mouth as well as in her b/l arms and legs.     She was recently admitted on 10/17-10/18 after few days of p.o. ciprofloxacin  for similar blistering rash in her upper extremities and lower extremities bilaterally but no oral mucosal involvement.  There was concern for rash related to ciprofloxacin  but was discharged on ciprofloxacin  with advise for close monitoring.  Rash appeared to have resolved by the time of discharge.   # Recurrent blistering rash in upper and lower extremities including lips and oral cavity, concern for pemphigus vulgaris during admission with positive Nikolsky sign versus drug-induced rash related to ciprofloxacin  -IV methylprednisolone  changed to prednisone  40 mg p.o. daily per primary   # Right hip PJI - s/p completion of IV cefepime  through 10/13 then transitioned to PO ciprofloxacin      # Acquired Pancytopenia - Oncology was consulted, multifactorial causes due to recurrent hospitalization, poor nutrition, spectrum antibiotics, recent infection, untreated and autoimmune disease - No plan for bone marrow biopsy - Supportive treatment with transfusion as needed   # Stage 3 sacral pressure wound  - continue wound care    # Rheumatoid arthritis-on prednisone  5 mg p.o. daily and methotrexate  12.5 mg weekly at home.  Methotrexate  currently on hold.  Prednisone  40 mg p.o. daily # Multiple sclerosis - not on specific tx  Recommendations - Discussed with patient as well as daughter at bedside regarding possible trial with low-dose levofloxacin as alternative treatment for suppression for right hip prosthetic joint infection given low risk  for cross-reactivity to which they are agreeable.  RN informed regarding 1 dose of 250 mg levofloxacin challenge today and monitor for any signs or symptoms concerning for reaction. - Monitor CBC - Will reevaluate tomorrow - Steroid management per primary team - Universal/standard isolation precaution Following  Rest of the management as per the primary team. Thank you for the consult. Please page with pertinent questions or concerns.  ______________________________________________________________________ Subjective patient seen and examined at the bedside.  Daughter at bedside.  Rashes seem stable  Vitals BP (!) 146/80 (BP Location: Right Arm)   Pulse (!) 107   Temp 98.3 F (36.8 C) (Oral)   Resp 20   Ht 5' 1 (1.549 m)   Wt 46.9 kg   SpO2 97%   BMI 19.54 kg/m     Physical Exam Constitutional: Elderly female sitting in the bed, not in acute distress    Comments: Sores in the lips, and ulcer in the oral cavity- stable   Cardiovascular:     Rate and  Rhythm: Normal rate and regular rhythm.     Heart sounds:   Pulmonary:     Effort: Pulmonary effort is normal.     Comments:   Abdominal:     Palpations: Abdomen is  nondistended    Tenderness:   Musculoskeletal:        General: No swelling or tenderness.   Skin:    Comments: Prior rashes in upper and lower extremities stable.  No new rashes  Neurological:     General: Awake, alert and oriented  Psychiatric:        Mood and Affect: Mood normal.   Pertinent Microbiology Results for orders placed or performed during the hospital encounter of 03/22/24  Resp panel by RT-PCR (RSV, Flu A&B, Covid) Anterior Nasal Swab     Status: None   Collection Time: 03/22/24  1:42 PM   Specimen: Anterior Nasal Swab  Result Value Ref Range Status   SARS Coronavirus 2 by RT PCR NEGATIVE NEGATIVE Final    Comment: (NOTE) SARS-CoV-2 target nucleic acids are NOT DETECTED.  The SARS-CoV-2 RNA is generally detectable in upper respiratory specimens during the acute phase of infection. The lowest concentration of SARS-CoV-2 viral copies this assay can detect is 138 copies/mL. A negative result does not preclude SARS-Cov-2 infection and should not be used as the sole basis for treatment or other patient management decisions. A negative result may occur with  improper specimen collection/handling, submission of specimen other than nasopharyngeal swab, presence of viral mutation(s) within the areas targeted by this assay, and inadequate number of viral copies(<138 copies/mL). A negative result must be combined with clinical observations, patient history, and epidemiological information. The expected result is Negative.  Fact Sheet for Patients:  bloggercourse.com  Fact Sheet for Healthcare Providers:  seriousbroker.it  This test is no t yet approved or cleared by the [place]  FDA and  has been authorized for detection and/or diagnosis of SARS-CoV-2 by FDA under an Emergency Use Authorization (EUA). This EUA will remain  in effect (meaning this test can be used) for the duration of the COVID-19 declaration  under Section 564(b)(1) of the Act, 21 U.S.C.section 360bbb-3(b)(1), unless the authorization is terminated  or revoked sooner.       Influenza A by PCR NEGATIVE NEGATIVE Final   Influenza B by PCR NEGATIVE NEGATIVE Final    Comment: (NOTE) The Xpert Xpress SARS-CoV-2/FLU/RSV plus assay is intended as an aid in the diagnosis of influenza from Nasopharyngeal swab specimens and should not be used as a sole basis for treatment. Nasal washings and aspirates are unacceptable for Xpert Xpress SARS-CoV-2/FLU/RSV testing.  Fact Sheet for Patients: bloggercourse.com  Fact Sheet for Healthcare Providers: seriousbroker.it  This test is not yet approved or cleared by the [place]  FDA and has been authorized for detection and/or diagnosis of SARS-CoV-2 by FDA under an Emergency Use Authorization (EUA). This EUA will remain in effect (meaning this test can be used) for the duration of the COVID-19 declaration under Section 564(b)(1) of the Act, 21 U.S.C. section 360bbb-3(b)(1), unless the authorization is terminated or revoked.     Resp Syncytial Virus by PCR NEGATIVE NEGATIVE Final    Comment: (NOTE) Fact Sheet for Patients: bloggercourse.com  Fact Sheet for Healthcare Providers: seriousbroker.it  This test is not yet approved or cleared by the [place]  FDA and has been authorized for detection and/or diagnosis of SARS-CoV-2 by FDA under an Emergency Use Authorization (EUA). This EUA will remain in effect (meaning this test can be  used) for the duration of the COVID-19 declaration under Section 564(b)(1) of the Act, 21 U.S.C. section 360bbb-3(b)(1), unless the authorization is terminated or revoked.  Performed at [facility], [address] [place], [place] [address]    *Note: Due to a large number of results and/or encounters for the requested time  period, some results have not been displayed. A complete set of results can be found in Results Review.   Pertinent Lab.    Latest Ref Rng & Units 03/24/2024    4:32 AM 03/23/2024    4:17 AM 03/22/2024    9:21 PM  CBC  WBC 4.0 - 10.5 K/uL 2.8  3.4    Hemoglobin 12.0 - 15.0 g/dL 9.2  9.5  8.8   Hematocrit 36.0 - 46.0 % 28.5  28.4  27.1   Platelets 150 - 400 K/uL 33  45        Latest Ref Rng & Units 03/24/2024    4:32 AM 03/23/2024    4:17 AM 03/22/2024    3:27 PM  CMP  Glucose 70 - 99 mg/dL 55  73  75   BUN 8 - 23 mg/dL <5  <5  <3   Creatinine 0.44 - 1.00 mg/dL 9.53  9.49  9.49   Sodium 135 - 145 mmol/L 133  132  134   Potassium 3.5 - 5.1 mmol/L 3.4  3.7  3.0   Chloride 98 - 111 mmol/L 98  99  96   CO2 22 - 32 mmol/L 23  27    Calcium  8.9 - 10.3 mg/dL 7.3  7.3       Pertinent Imaging today Plain films and CT images have been personally visualized and interpreted; radiology reports have been reviewed. Decision making incorporated into the Impression /   No results found.  I spent 50 minutes involved in face-to-face and non-face-to-face activities for this patient on the day of the visit. Professional time spent includes the following activities: Preparing to see the patient (review of tests), Obtaining and reviewing separately obtained history (Hospitalist progress note), Performing a medically appropriate examination and evaluation, Ordering medications/labs, referring and communicating with other health care professionals, Documenting clinical information in the EMR, Independently interpreting results (not separately reported), Communicating results to the patient/daughter, Counseling and educating the patient/daughter and Care coordination (not separately reported).   Plan d/w requesting provider as well as ID pharm D  Of note, portions of this note may have been created with voice recognition software. While this note has been edited for accuracy, occasional wrong-word or  'sound-a-like' substitutions may have occurred due to the inherent limitations of voice recognition software.   Electronically signed by:   Annalee Orem, MD Infectious Disease Physician [facility] Pager: [phone]

## 2024-03-24 NOTE — Inpatient Diabetes Management (Signed)
 Inpatient Diabetes Program Recommendations  AACE/ADA: New Consensus Statement on Inpatient Glycemic Control (2015)  Target Ranges:  Prepandial:   less than 140 mg/dL      Peak postprandial:   less than 180 mg/dL (1-2 hours)      Critically ill patients:  140 - 180 mg/dL   Lab Results  Component Value Date   GLUCAP 74 03/12/2024   HGBA1C 6.0 (H) 08/06/2023    Review of Glycemic Control  Latest Reference Range & Units 03/24/24 04:32  Glucose 70 - 99 mg/dL 55 (L)  (L): Data is abnormally low Diabetes history: PreDM Outpatient Diabetes medications: none Current orders for Inpatient glycemic control: none  Inpatient Diabetes Program Recommendations:    Noted hypoglycemia this AM of glucose serum, consider adding CBGs TID.   Thanks, Tinnie Minus, MSN, RNC-OB Diabetes Coordinator [phone] (8a-5p)

## 2024-03-24 NOTE — Progress Notes (Signed)
 Sherri [name]   DOB:02-18-1941   [id number]    ASSESSMENT & PLAN:  Acquired pancytopenia Overall, the most likely cause of her severe pancytopenia is due to bone marrow suppression from recurrent hospitalization, poor nutrition, broad-spectrum IV antibiotics and recent infection, in the setting of untreated autoimmune disease Bone marrow biopsy is not indicated Recent anemia panel showed no nutritional deficiency   The mainstay of treatment will be supportive care with transfusion I recommend keeping transfusion threshold with giving her blood to keep hemoglobin greater than 8 g to facilitate wound healing and given recent elevated troponin She does not need platelet transfusion unless signs of bleeding or platelet count less than 10 There is no role for G-CSF for low white blood cell count   Moderate to severe protein calorie malnutrition Multiple pressure injury Due to bedbound status, poor protein status/intake I recommend frequent small meals and mobilization as tolerated   Rheumatoid arthritis I gave the patient and caregiver verbal instruction not to resume methotrexate    Mouth sores Abnormal skin rash Not sure whether this is autoimmune related or drug reaction Continue conservative management   Discharge planning Will defer to primary service She will likely need frequent monitoring of blood count in the outpatient setting after discharge  Sherri Bedford, MD 03/24/2024 8:56 AM  Subjective:  She is seen in the room.  Caregiver by the bedside. She felt she has good appetite.  She has persistent mild sore, stable No recent bleeding  Objective:  Vitals:   03/23/24 2120 03/24/24 0439  BP: (!) 151/81 (!) 156/86  Pulse:  (!) 107  Resp:  15  Temp:  98.7 F (37.1 C)  SpO2:  95%     Intake/Output Summary (Last 24 hours) at 03/24/2024 0856 Last data filed at 03/24/2024 0442 Gross per 24 hour  Intake 340 ml  Output 1670 ml  Net -1330 ml

## 2024-03-24 NOTE — Plan of Care (Signed)
  Problem: Clinical Measurements: Goal: Ability to maintain clinical measurements within normal limits will improve Outcome: Progressing Goal: Will remain free from infection Outcome: Progressing Goal: Diagnostic test results will improve Outcome: Progressing   Problem: Education: Goal: Knowledge of General Education information will improve Description: Including pain rating scale, medication(s)/side effects and non-pharmacologic comfort measures Outcome: Not Progressing   Problem: Health Behavior/Discharge Planning: Goal: Ability to manage health-related needs will improve Outcome: Not Progressing

## 2024-03-25 ENCOUNTER — Inpatient Hospital Stay (HOSPITAL_COMMUNITY)

## 2024-03-25 DIAGNOSIS — R21 Rash and other nonspecific skin eruption: Secondary | ICD-10-CM | POA: Diagnosis not present

## 2024-03-25 DIAGNOSIS — R059 Cough, unspecified: Secondary | ICD-10-CM | POA: Diagnosis not present

## 2024-03-25 DIAGNOSIS — D61818 Other pancytopenia: Secondary | ICD-10-CM | POA: Diagnosis not present

## 2024-03-25 DIAGNOSIS — T8451XD Infection and inflammatory reaction due to internal right hip prosthesis, subsequent encounter: Secondary | ICD-10-CM | POA: Diagnosis not present

## 2024-03-25 LAB — CBC WITH DIFFERENTIAL/PLATELET
Abs Immature Granulocytes: 0.29 K/uL — ABNORMAL HIGH (ref 0.00–0.07)
Basophils Absolute: 0 K/uL (ref 0.0–0.1)
Basophils Relative: 0 %
Eosinophils Absolute: 0 K/uL (ref 0.0–0.5)
Eosinophils Relative: 0 %
HCT: 29.1 % — ABNORMAL LOW (ref 36.0–46.0)
Hemoglobin: 9.2 g/dL — ABNORMAL LOW (ref 12.0–15.0)
Immature Granulocytes: 8 %
Lymphocytes Relative: 7 %
Lymphs Abs: 0.3 K/uL — ABNORMAL LOW (ref 0.7–4.0)
MCH: 28.8 pg (ref 26.0–34.0)
MCHC: 31.6 g/dL (ref 30.0–36.0)
MCV: 90.9 fL (ref 80.0–100.0)
Monocytes Absolute: 0.6 K/uL (ref 0.1–1.0)
Monocytes Relative: 16 %
Neutro Abs: 2.6 K/uL (ref 1.7–7.7)
Neutrophils Relative %: 69 %
Platelets: 46 K/uL — ABNORMAL LOW (ref 150–400)
RBC: 3.2 MIL/uL — ABNORMAL LOW (ref 3.87–5.11)
RDW: 20.1 % — ABNORMAL HIGH (ref 11.5–15.5)
Smear Review: NORMAL
WBC: 3.7 K/uL — ABNORMAL LOW (ref 4.0–10.5)
nRBC: 0 % (ref 0.0–0.2)

## 2024-03-25 LAB — COMPREHENSIVE METABOLIC PANEL WITH GFR
ALT: 11 U/L (ref 0–44)
AST: 22 U/L (ref 15–41)
Albumin: 2.6 g/dL — ABNORMAL LOW (ref 3.5–5.0)
Alkaline Phosphatase: 55 U/L (ref 38–126)
Anion gap: 9 (ref 5–15)
BUN: 8 mg/dL (ref 8–23)
CO2: 22 mmol/L (ref 22–32)
Calcium: 7.6 mg/dL — ABNORMAL LOW (ref 8.9–10.3)
Chloride: 98 mmol/L (ref 98–111)
Creatinine, Ser: 0.54 mg/dL (ref 0.44–1.00)
GFR, Estimated: >60 mL/min (ref >60)
Glucose, Bld: 184 mg/dL — ABNORMAL HIGH (ref 70–99)
Potassium: 4.1 mmol/L (ref 3.5–5.1)
Sodium: 129 mmol/L — ABNORMAL LOW (ref 135–145)
Total Bilirubin: 0.5 mg/dL (ref 0.0–1.2)
Total Protein: 5.3 g/dL — ABNORMAL LOW (ref 6.5–8.1)

## 2024-03-25 LAB — GLUCOSE, CAPILLARY
Glucose-Capillary: 122 mg/dL — ABNORMAL HIGH (ref 70–99)
Glucose-Capillary: 113 mg/dL — ABNORMAL HIGH (ref 70–99)
Glucose-Capillary: 140 mg/dL — ABNORMAL HIGH (ref 70–99)
Glucose-Capillary: 136 mg/dL — ABNORMAL HIGH (ref 70–99)

## 2024-03-25 LAB — PHOSPHORUS: Phosphorus: 1.9 mg/dL — ABNORMAL LOW (ref 2.5–4.6)

## 2024-03-25 LAB — HSV 1/2 PCR (SURFACE)
HSV-1 DNA: DETECTED — AB
HSV-2 DNA: NOT DETECTED

## 2024-03-25 LAB — MAGNESIUM: Magnesium: 2.1 mg/dL (ref 1.7–2.4)

## 2024-03-25 MED ORDER — LEVOFLOXACIN 25 MG/ML PO SOLN
500.0000 mg | Freq: Every day | ORAL | Status: DC
  Administered 2024-03-26 – 2024-03-29 (×4): 500 mg via ORAL
  Filled 2024-03-25 (×4): qty 20

## 2024-03-25 MED ORDER — POTASSIUM & SODIUM PHOSPHATES 280-160-250 MG PO PACK
1.0000 | PACK | Freq: Three times a day (TID) | ORAL | Status: AC
  Administered 2024-03-25 – 2024-03-27 (×8): 1 via ORAL
  Filled 2024-03-25 (×8): qty 1

## 2024-03-25 MED ORDER — SODIUM CHLORIDE 0.9 % IV SOLN
INTRAVENOUS | Status: DC
Start: 2024-03-25 — End: 2024-03-27

## 2024-03-25 MED ORDER — ALUM & MAG HYDROXIDE-SIMETH 200-200-20 MG/5ML PO SUSP
30.0000 mL | ORAL | Status: DC | PRN
  Filled 2024-03-25: qty 30

## 2024-03-25 MED ORDER — CARMEX CLASSIC LIP BALM EX OINT
1.0000 | TOPICAL_OINTMENT | CUTANEOUS | Status: DC | PRN
  Administered 2024-03-25: 1 via TOPICAL
  Filled 2024-03-25: qty 10

## 2024-03-25 MED ORDER — VALACYCLOVIR HCL 500 MG PO TABS
1000.0000 mg | ORAL_TABLET | Freq: Two times a day (BID) | ORAL | Status: DC
  Administered 2024-03-25 – 2024-03-29 (×8): 1000 mg via ORAL
  Filled 2024-03-25 (×8): qty 2

## 2024-03-25 MED ORDER — WHITE PETROLATUM EX OINT
TOPICAL_OINTMENT | CUTANEOUS | Status: DC | PRN
  Administered 2024-03-25: 1 via TOPICAL
  Filled 2024-03-25 (×2): qty 5

## 2024-03-25 NOTE — Consult Note (Addendum)
 ORTHOPAEDIC CONSULTATION  REQUESTING PHYSICIAN: Fausto Burnard LABOR, DO  PCP:  [name] PARAS, MD  Chief Complaint: Eval right hip.   HPI: Sherri [name] is a 83 y.o. female PMH significant for rheumatoid arthritis, MS, hyperlipidemia. She is well known to [facility] with multiple surgeries of her right hip complicated with persistent wound/periprosthetic infection requiring revision total hip arthroplasty with head ball and liner exchange on 01/21/2024 due to recurrent instability. She is being followed by ID. She is currently hospitalized due to suspected medication induced rash and blistering. She is under management of [facility], who consulted for right hip. She was supposed to follow-up in office this week for a wound recheck, but was unable to due to hospitalization. No new symptoms reported with her hip.    Past Medical History:  Diagnosis Date   Anxiety    takes Valium  daily as needed   Basal cell carcinoma 01/21/1988   left nostril (MOHS), sup-right calf (CX35FU)   Basal cell carcinoma 03/22/1996   right calf (CX35FU)   Basal cell carcinoma 03/31/1995   left wing nose   Bruises easily    d/t meds   Cancer (HCC)    basal cell ca, in situ- uterine    Cataracts, bilateral    removed bilateral   Chronic back pain    Dizziness    r/t to meds   GERD (gastroesophageal reflux disease)    no meds on a regular basis but will take Tums if needed   Headache(784.0)    r/t neck issues   History of bronchitis 6-53yrs ago   History of colon polyps    benign   HOH (hard of hearing)    wears hearing aids   HOH (hard of hearing)    wears hearing aids    Hyperlipidemia    takes Atorvastatin  on Mondays and Fridays   Hypertension    Joint pain    Joint swelling    Multiple sclerosis    Neuromuscular disorder (HCC)    Dr. FABIENE Crete- [facility], follows [facility].S.   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   Rheumatoid arthritis (HCC)    Dr Lorrayne weekly, RA- hands- knees- feet    Rheumatoid arthritis(714.0)    Dr Dolphus takes Xeljanz  daily   Right wrist fracture    Skin cancer    Squamous cell carcinoma of skin 11/02/2001   in situ-right knee (cx62fu)   Squamous cell carcinoma of skin 11/12/2011   in situ-left forearm (CX35FU), in situ-left foot (CX35FU)   Squamous cell carcinoma of skin 01/22/2012   in situ-left lower forearm (txpbx)   Squamous cell carcinoma of skin 11/17/2012   right shin (txpbx)   Squamous cell carcinoma of skin 10/28/2013   in situ-right shoulder (CX35FU)   Squamous cell carcinoma of skin 04/28/2014   well diff-right shin (txpbx)   Squamous cell carcinoma of skin 11/02/2014   in situ-Left shin (txpbx)   Squamous cell carcinoma of skin 12/15/2014   in situ-Left hand (txpbx), bowens-left side chest (txpbx)   Squamous cell carcinoma of skin 05/09/2015   in situ-left hand (txpbx)    Squamous cell carcinoma of skin 05/07/2016   in situ-left inner shin,ant, in situ-left inner shin, post, in situ-left outer forearm, in situ-right knuckle   Squamous cell carcinoma of skin 03/1302018   in situ-left  shoulder (txpbx), in situ-right inner shin (txpbx), in situ-top of left foot (txpbx), KA- left forearm (txpbx)   Squamous cell carcinoma of skin 12/02/2016   in situ-left inner shin (txpbx)   Squamous cell carcinoma of skin 03/04/2017   in situ-left outer sup, shin (txpbx), in situ- right 2nd knuckle finger (txpbx), in situ- Left wrist (txpbx)   Squamous cell carcinoma of skin 04/06/2018   in situ-above left knee inner (txpbx)   Squamous cell carcinoma of skin 04/21/2018   in situ-right top hand (txpbx)   Squamous cell carcinoma of skin 07/08/2018   in situ-right lower inner shin (txpbx)   Squamous cell carcinoma of skin 04/27/2019   in situ- Left neck(CX35FU), In situ- right neck  (CX35FU)   Urinary retention    sees Dr.Wrenn about 2 times a yr   Past Surgical History:  Procedure Laterality Date   ABDOMINAL HYSTERECTOMY     1985   ANTERIOR CERVICAL DECOMP/DISCECTOMY FUSION N/A 04/23/2022   Procedure: Anterior Cervical Decompression/Discectomy Fusion - Cervical Seven-Thoracic One;  Surgeon: Louis Shove, MD;  Location: [facility];  Service: Neurosurgery;  Laterality: N/A;  3C   ANTERIOR HIP REVISION Right 01/21/2024   Procedure: REVISION, TOTAL ARTHROPLASTY, HIP, ANTERIOR APPROACH;  Surgeon: Fidel Rogue, MD;  Location: [facility];  Service: Orthopedics;  Laterality: Right;   APPENDECTOMY     with TAH   BACK SURGERY     several   BREAST BIOPSY Right    Results were negative   BROW LIFT Bilateral 10/02/2016   Procedure: BILATERAL LOWER LID BLEPHAROPLASTY;  Surgeon: Laurie Loyd Redhead, MD;  Location: [facility];  Service: Plastics;  Laterality: Bilateral;   CARPAL TUNNEL RELEASE Right    cataracts     CERVICAL FUSION     Dr Leeann   CERVICAL FUSION  12/31/2012   Dr Leeann   CERVICAL FUSION  04/2022   CHEST TUBE INSERTION     for traumatic Pneumothorax   COLONOSCOPY  07/16/2010   normal    COLONOSCOPY W/ POLYPECTOMY  1997   negative since; Dr Debrah   ESOPHAGOGASTRODUODENOSCOPY  07/16/2010   normal   eye lid raise     EYE SURGERY Bilateral    cataracts removed - /w IOL   HARDWARE REMOVAL  08/2021   HIP CLOSED REDUCTION Right 11/16/2023   Procedure: CLOSED MANIPULATION, JOINT, HIP;  Surgeon: Ernie Cough, MD;  [facility]: [facility];  Service: Orthopedics;  Laterality: Right;   HIP CLOSED REDUCTION N/A 11/30/2023   Procedure: CLOSED REDUCTION, HIP;  Surgeon: Fidel Rogue, MD;  [facility];  Service: Orthopedics;  Laterality: N/A;   INCISION AND DRAINAGE OF WOUND Right 11/28/2023   Procedure: IRRIGATION AND DEBRIDEMENT WOUND; REMOVAL OF HARDWARE;  Surgeon: Edna Toribio LABOR, MD;  Location: [facility];  Service: Orthopedics;  Laterality: Right;  I & D RIGHT ELBOW   JOINT  REPLACEMENT     LUMBAR FUSION     Dr Leeann   NASAL SINUS SURGERY     ORIF ELBOW FRACTURE Right 11/20/2023   Procedure: OPEN REDUCTION INTERNAL FIXATION (ORIF) ELBOW/OLECRANON FRACTURE;  Surgeon: [name] Franky SQUIBB, MD;  Location: [facility];  Service: Orthopedics;  Laterality: Right;   POSTERIOR CERVICAL FUSION/FORAMINOTOMY N/A 12/31/2012   Procedure: POSTERIOR LATERAL CERVICAL FUSION/FORAMINOTOMY LEVEL 1 CERVICAL THREE-FOUR WITH LATERAL MASS SCREWS;  Surgeon: Catalina CHRISTELLA Leeann, MD;  [facility]: [facility];  Service: Neurosurgery;  Laterality: N/A;   PTOSIS REPAIR Bilateral 10/02/2016   Procedure: INTERNAL PTOSIS REPAIR;  Surgeon: Abugo, Usiwoma  Kathryne, MD;  Location: [facility];  Service: Plastics;  Laterality: Bilateral;   SKIN BIOPSY     THYROID  SURGERY     R lobe removed- 1972, has grown back - CT last done- 2018   TOTAL HIP ARTHROPLASTY  12/22/2011   Procedure: TOTAL HIP ARTHROPLASTY;  Surgeon: Dempsey JINNY Sensor, MD;  Location: [facility];  Service: Orthopedics;  Laterality: Left;   TOTAL HIP ARTHROPLASTY Right 11/12/2023   Procedure: ARTHROPLASTY, HIP, TOTAL, ANTERIOR APPROACH;  Surgeon: Fidel Rogue, MD;  [facility]: [facility];  Service: Orthopedics;  Laterality: Right;   TOTAL HIP REVISION Right 12/03/2023   Procedure: TOTAL HIP REVISION;  Surgeon: Fidel Rogue, MD;  [facility];  Service: Orthopedics;  Laterality: Right;  Right acetabular revision Anterior approach   TOTAL SHOULDER ARTHROPLASTY     TUBAL LIGATION     UPPER GASTROINTESTINAL ENDOSCOPY  2012   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   WRIST SURGERY Left 09/16/2022   wrist/arm   Social History   Socioeconomic History   Marital status: Married    Spouse name: Cheryl   Number of children: 2   Years of education: Not on file   Highest education level: Not on file  Occupational History   Occupation: retired  Tobacco Use   Smoking status: Former    Current packs/day: 0.00    Average packs/day: 2.5 packs/day for 20.0 years (50.0 ttl  pk-yrs)    Types: Cigarettes    Start date: 05/26/1961    Quit date: 05/26/1981    Years since quitting: 42.8    Passive exposure: Never   Smokeless tobacco: Never   Tobacco comments:    Smoked [phone], up to 2.5 ppd  Vaping Use   Vaping status: Never Used  Substance and Sexual Activity   Alcohol  use: Yes    Alcohol /week: 14.0 standard drinks of alcohol     Types: 14 Cans of beer per week    Comment: 2% beer   Drug use: Never   Sexual activity: Yes    Birth control/protection: Surgical    Comment: Hysterectomy  Other Topics Concern   Not on file  Social History Narrative   Not on file   Social Drivers of Health   Financial Resource Strain: Low Risk  (02/21/2022)   Overall Financial Resource Strain (CARDIA)    Difficulty of Paying Living Expenses: Not hard at all  Food Insecurity: No Food Insecurity (03/23/2024)   Hunger Vital Sign    Worried About Running Out of Food in the Last Year: Never true    Ran Out of Food in the Last Year: Never true  Transportation Needs: No Transportation Needs (03/23/2024)   PRAPARE - Administrator, Civil Service (Medical): No    Lack of Transportation (Non-Medical): No  Physical Activity: Inactive (02/21/2022)   Exercise Vital Sign    Days of Exercise per Week: 0 days    Minutes of Exercise per Session: 0 min  Stress: Stress Concern Present (02/21/2022)   [employer] of Occupational Health - Occupational Stress Questionnaire    Feeling of Stress : Rather much  Social Connections: Socially Integrated (03/23/2024)   Social Connection and Isolation Panel    Frequency of Communication with Friends and Family: More than three times a week    Frequency of Social Gatherings with Friends and Family: More than three times a week    Attends Religious Services: 1 to 4 times per year    Active Member of Clubs or  Organizations: Yes    Attends Engineer, Structural: More than 4 times per year    Marital Status: Married   Family  History  Problem Relation Age of Onset   Cancer Mother        pancreatic   Diabetes Mother    Pancreatic cancer Mother    Heart disease Father        Rheumatic   Cancer Father        ? stomach   Stomach cancer Father    Cancer Sister        stomach   Stomach cancer Sister    Cancer Maternal Aunt        X 4; ? primary   Heart disease Paternal Aunt    Cancer Maternal Grandmother        cervical   Colon cancer Other        Aunts   Multiple sclerosis Daughter    Hypertension Neg Hx    Stroke Neg Hx    Esophageal cancer Neg Hx    Rectal cancer Neg Hx    Allergies  Allergen Reactions   Ciprofloxacin  Itching, Nausea Only, Rash and Other (See Comments)    Blistering rash and oral ulcers occurred when retried Cipro  and severe mouth pain Made her sick- stomach hurt. (Also)    Darvon [Propoxyphene Hcl] Shortness Of Breath and Other (See Comments)    ? Dose Related ? Lowered respirations greatly   Demerol [Meperidine] Shortness Of Breath and Other (See Comments)    Respiratory Distress   Penicillins Hives and Other (See Comments)    Has patient had a PCN reaction causing immediate rash, facial/tongue/throat swelling, SOB or lightheadedness with hypotension: No SEVERE RASH INVOLVING MUCUS MEMBRANES or SKIN NECROSIS: #  #  #  YES  #  #  #  Has patient had a PCN reaction that required hospitalization No Has patient had a PCN reaction occurring within the last 10 years: No.    Imuran [Azathioprine] Other (See Comments)    Weakness   Sulfa  Antibiotics Nausea Only and Other (See Comments)    States had severe indigestion and heartburn and nausea and had to stop taking   Arava [Leflunomide] Other (See Comments)    Excessive weight gain   Lipitor [Atorvastatin ] Nausea And Vomiting    Currently taking 2 times weekly - able to tolerate low dose    Vytorin [Ezetimibe-Simvastatin] Nausea And Vomiting   Prior to Admission medications   Medication Sig Start Date End Date Taking?  Authorizing Provider  acetaminophen  (TYLENOL ) 325 MG tablet Take 2 tablets (650 mg total) by mouth every 6 (six) hours as needed for mild pain (pain score 1-3) or fever (or Fever >/= 101). 11/20/23  Yes Dorinda Drue DASEN, MD  ascorbic acid  (VITAMIN C ) 1000 MG tablet Take 1 tablet (1,000 mg total) by mouth daily. 12/23/23  Yes Jerilynn Daphne SAILOR, NP  Biotin  5000 MCG TABS Take 10,000 mcg by mouth in the morning.   Yes [provider]  Carboxymethylcellul-Glycerin  (LUBRICATING EYE DROPS OP) Place 1 drop into both eyes 3 (three) times daily as needed (dry eyes).   Yes [provider]  Cholecalciferol  (VITAMIN D3) 1000 units CAPS Take 1,000 Units by mouth daily.   Yes [provider]  cyclobenzaprine  (FLEXERIL ) 10 MG tablet TAKE 1 TABLET BY MOUTH THREE TIMES A DAY AS NEEDED FOR MUSCLE SPASM Patient taking differently: Take 10 mg by mouth 3 (three) times daily as needed for muscle spasms.  06/11/23  Yes Burns, Glade PARAS, MD  denosumab  (PROLIA ) 60 MG/ML SOSY injection Inject 60 mg into the skin every 6 (six) months. Deliver to rheum: [address], [address], [place], [place] [address]. Appt on 07/23/2022 07/17/22 11/26/24 Yes Cheryl Waddell HERO, PA-C  diazepam  (VALIUM ) 5 MG tablet TAKE 1 TABLET BY MOUTH AT BEDTIME AS NEEDED FOR MUSCLE SPASMS. Patient taking differently: Take 5 mg by mouth at bedtime as needed (for muscle spasms). 02/12/24  Yes Burns, Glade PARAS, MD  DULoxetine  (CYMBALTA ) 20 MG capsule TAKE 1 CAPSULE BY MOUTH AT BEDTIME. 02/09/24  Yes Burns, Glade PARAS, MD  folic acid  (FOLVITE ) 1 MG tablet Take 1 tablet (1 mg total) by mouth daily. 09/02/23  Yes Cheryl Waddell HERO, PA-C  furosemide  (LASIX ) 20 MG tablet Take 0.5 tablets (10 mg total) by mouth daily. Patient taking differently: Take 10 mg by mouth daily as needed (for swelling of the legs). 12/23/23  Yes Jerilynn Daphne SAILOR, NP  Lactobacillus (ACIDOPHILUS) CAPS capsule Take 2 capsules by mouth 3 (three) times daily. 03/12/24  Yes Arlon Carliss ORN, DO   magic mouthwash (nystatin , lidocaine , diphenhydrAMINE , alum & mag hydroxide) suspension Swish and spit 5 mLs 3 (three) times daily as needed for mouth pain. 03/15/24  Yes Burns, Glade PARAS, MD  methotrexate  (RHEUMATREX) 2.5 MG tablet TAKE 5 TABLETS (12.5 MG TOTAL) BY MOUTH ONCE A WEEK. CAUTION:CHEMOTHERAPY. PROTECT FROM LIGHT. Patient taking differently: Take 12.5 mg by mouth every Wednesday. Caution:Chemotherapy. Protect from light. 03/01/24  Yes Deveshwar, Maya, MD  Nystatin  (GERHARDT'S BUTT CREAM) CREA Apply 1 Application topically 3 (three) times daily. Patient taking differently: Apply 1 Application topically See admin instructions. Apply as directed to buttocks after every cleansing/drying 01/06/24  Yes Burns, Glade PARAS, MD  oxyCODONE  (ROXICODONE ) 5 MG immediate release tablet Take 1 tablet (5 mg total) by mouth every 4 (four) hours as needed for severe pain (pain score 7-10). 01/26/24  Yes Taryne Kiger, Valery RAMAN, PA-C  pantoprazole  (PROTONIX ) 40 MG tablet Take 1 tablet (40 mg total) by mouth 2 (two) times daily before a meal. 07/27/23  Yes Burns, Glade PARAS, MD  polyethylene glycol powder (GLYCOLAX /MIRALAX ) 17 GM/SCOOP powder Take 17 g by mouth daily. Patient taking differently: Take 17 g by mouth See admin instructions. Mix 17 grams into a cup of coffee and drink in the morning 03/12/24  Yes Arlon Carliss ORN, DO  potassium chloride  SA (KLOR-CON  M) 20 MEQ tablet TAKE 1 TABLET (20 MEQ TOTAL) BY MOUTH EVERY OTHER DAY. 03/07/24  Yes Burns, Glade PARAS, MD  predniSONE  (DELTASONE ) 5 MG tablet TAKE 1 TABLET BY MOUTH EVERY DAY IN THE MORNING Patient taking differently: Take 5 mg by mouth daily with breakfast. 05/22/23  Yes Cheryl Waddell HERO, PA-C  sodium chloride  1 g tablet Take 1 tablet (1 g total) by mouth 2 (two) times daily with a meal. 02/03/24  Yes Burns, Glade PARAS, MD  tamsulosin  (FLOMAX ) 0.4 MG CAPS capsule TAKE 1 CAPSULE BY MOUTH EVERY DAY AFTER SUPPER Patient taking differently: Take 0.4 mg by mouth daily after supper.  02/09/24  Yes Burns, Glade PARAS, MD  Zinc  Sulfate 220 (50 Zn) MG TABS Take 1 tablet (220 mg total) by mouth daily with supper. 12/22/23  Yes Jerilynn Daphne SAILOR, NP  atorvastatin  (LIPITOR) 10 MG tablet Take 1 tablet (10 mg total) by mouth every evening. Patient not taking: Reported on 03/22/2024 12/22/23   Jerilynn Daphne SAILOR, NP  bisacodyl  (DULCOLAX) 10 MG suppository Place 1 suppository (10 mg total) rectally  daily as needed for moderate constipation. Patient not taking: Reported on 03/22/2024 11/20/23   Dorinda Drue DASEN, MD  docusate sodium  (COLACE) 100 MG capsule Take 1 capsule (100 mg total) by mouth daily. Patient not taking: Reported on 03/22/2024 11/21/23   Dorinda Drue DASEN, MD  hydrOXYzine  (ATARAX ) 10 MG tablet Take 1 tablet (10 mg total) by mouth 3 (three) times daily as needed for itching. Patient not taking: Reported on 03/22/2024 03/12/24   Arlon Carliss ORN, DO  Upadacitinib  ER ([name] ) 15 MG TB24 Take 15 mg by mouth daily. Patient not taking: Reported on 03/22/2024    [provider]   No results found.  Positive ROS: All other systems have been reviewed and were otherwise negative with the exception of those mentioned in the HPI and as above.  Physical Exam: General: Alert, no acute distress Respiratory: No cyanosis, no use of accessory musculature GI: No organomegaly, abdomen is soft and non-tender Psychiatric: Patient is competent for consent with normal mood and affect Lymphatic: No axillary or cervical lymphadenopathy  MUSCULOSKELETAL:  Examination of the right hip reveals hip abduction brace and dressing right hip, dressing removed, xeroform removed, suture intact, skin is still not healed. Painless log rolling of the hip.  Sensation intact, motor function intact, no shortening or ER noted. Palpable pedal pulses. Compartments soft, mild edema noted.   Assessment: S/p revision right THA, 01/21/24 with head ball and liner exchange.  Delayed wound healing right hip.  Abx per  ID.    Plan: Incision still has delayed healing in small area. Suture to remain intact. Recommend dry dressing only, no xeroform over incision, no lotions, ointments, or oils. She may weight-bear as tolerated right lower extremity. Posterior hip precautions. Needs to wear hip abduction brace at all times except for hygiene.  Antibiotics per ID.  She can follow-up in office for wound recheck in 2 weeks.  Can re-consult Orthopedics as needed.     Valery GORMAN Potters, PA-C    03/25/2024 4:58 PM

## 2024-03-25 NOTE — Progress Notes (Addendum)
 Progress Note   Patient: Sherri  Sherri [name]:[id number] DOB: [birth year] DOA: 03/22/2024     3 DOS: the patient was seen and examined on 03/25/2024   Brief hospital course: HPI on admission: Sherri  [name] is a 83 y.o. female with medical history significant for rheumatoid arthritis, MS, hyperlipidemia, multiple orthopedic surgeries including right hip arthroplasty who has undergone surgical revision of her right hip arthroplasty, has chronic osteomyelitis and is now being admitted to the hospital with recurrent rash concerning for drug reaction.  She was discharged from [facility] on 10/18 after being admitted for itchy rash on her extremities.  Her primary caregiver is at the bedside and assists significantly with providing the history.  It seems that the patient was on IV cefepime  via PICC line several weeks in the past, this was transitioned to p.o. ciprofloxacin .  She developed itchy rash over her entire body similar to the one she has now, when she started the ciprofloxacin  and was admitted to [facility].  While in the hospital, it seems the p.o. ciprofloxacin  was held and the rash completely resolved.  She resumed the ciprofloxacin  a couple weeks ago when she left the hospital, and the rash that we see now recurred.  On evaluation today in the ER, she also has pancytopenia.  1 unit PRBC has been ordered.    See H&P for full HPI on admission & ED course.  Consults: Hemetology/Oncology Infectious Disease   Assessment and Plan:  Rash and Oral Ulcers -- unclear etiology, but given this is 2nd time rash after taking ciprofloxacin , drug reaction highly suspected.  Rash may also be related to autoimmune disease (pemphigus vulgaris) but time course would definitely favor ciprofloxacin  reaction. 10/29 - rash improved, mouth pain severe --ID consulted for alternative antibiotic regimen --Avoid Cipro  --ID giving trial of Levaquin today --monitor closely for any signs of reaction --Given IV  Solu-Medrol  in the ED, now on prednisone  40 mg --Supportive care for symptom management -- Continue viscous lidocaine  --PRN hydroxyzine   -- Check HSV PCR --still pending  Cough -seems to triggered by swallowing liquids -- Check a chest x-ray -- Appreciate SLP swallow evaluation and diet recommendation -- Aspiration precautions  Hypokalemia -resolved with replacement Hypomagnesemia -resolved with replacement Hypophosphatemia -Phos 1.9 Hypocalcemia - Ca 7.6 -- Oral Phos-Nak today -- Monitor and replace electrolytes  Hyponatremia -sodium down to 129 today, likely from poor p.o. intake -- Change maintenance IV fluids to NS -- Monitor BMP  Chronic osteomyelitis of right hip-requested ID consultation for antibiotic management recommendations.  Avoid Cipro  --Appreciate ID input  Status post right total hip arthroplasty --Consult orthopedic for any specific activity restrictions --PT OT evaluations --Fall precautions   Acute anemia superimposed on anemia of chronic disease, no evidence of blood loss. -Transfused 1 unit PRBC for initial Hbg 6.4 Hbg today 9.5 -Goal hemoglobin greater than 7   Pancytopenia-unclear etiology, potentially related to drug reaction, autoimmune condition related to her RA, etc. --Appreciate Heme-Oncology consult --Monitor CBC --Transfusions of RBC's or platelets as needed   Elevated troponin- demand ischemia in setting of severe anemia EKG no acute ischemic changes, patient denies any chest pain.  Potentially related to heart strain from her significant anemia? -Trend troponin -Monitor on telemetry  Rheumatoid arthritis --appears stable without flare On methotrexate  and 5 mg prednisone . -- Methotrexate  on hold with cytopenias --Increased prednisone  to 40 mg for now      Subjective: Pt seen with her caregiver at bedside this morning.  She says that her mouth pain  is the same, since admission it has not really gotten any better or worse.  Her main  complaint is that her bottom hurts today.  She is requesting out of bed and working with PT.   Physical Exam: Vitals:   03/24/24 0439 03/24/24 1351 03/24/24 2030 03/25/24 0531  BP: (!) 156/86 (!) 146/80 (!) 147/77 (!) 157/74  Pulse: (!) 107 (!) 107 97 97  Resp: 15 20 18 19   Temp: 98.7 F (37.1 C) 98.3 F (36.8 C) 97.8 F (36.6 C) (!) 97.5 F (36.4 C)  TempSrc: Oral Oral Oral Oral  SpO2: 95% 97% 93% (!) 88%  Weight:      Height:       General exam: Sleeping comfortably, wakes to voice, no acute distress, frail HEENT: Few visible ulcerations in the poorly visualized oropharynx and lips, hearing grossly normal Respiratory system: Normal respiratory effort, on room air, lungs clear Cardiovascular system: normal S1/S2, RRR, no peripheral edema Gastrointestinal system: soft, NT, ND Central nervous system: A&O x 2+. no gross focal neurologic deficits, normal speech Skin: Purpuric rash on chest and shoulders appears similar but slowly fading no new lesions noted Psychiatry: normal mood, congruent affect  Data Reviewed:  Notable labs --   Na 133 >> 129 Glucose 184 Ca 7. 6 Phos 1.9 Albumin  2.6  WBC 3.4 >> 2.8 >> 3.7 Hbg 9.5>> 9.2 >> 9.2 stable Platelets 45 >> 33 >> 46 k   Family Communication: Caregiver at bedside on rounds  Disposition: Status is: Inpatient Closely monitoring CBC for stable/improving pancytopenia, and improvement in rash and oral ulcer pain currently not tolerating PO intake due to pain   Planned Discharge Destination: Home    Time spent: 42 minutes  Author: Burnard DELENA Cunning, DO 03/25/2024 1:00 PM  For on call review [email or web address].

## 2024-03-25 NOTE — Progress Notes (Addendum)
 [facility] Infectious Diseases Follow Up Note  Patient Identification: Patient Name: Sherri  Sherri [name] MRN: [id number] Admit Date: 03/22/2024 12:39 PM Age: 83 y.o.Today's Date: 03/25/2024  Reason for Visit: rash, PJI   Principal Problem:   Pancytopenia (HCC)  Antibiotics:  Ciprofloxacin  held since 10/28 am  Levofloxacin 250 mg 10/30  Lines/Hardware:  Interval Events: Remains afebrile Labs remarkable for NA 129, BG 184, phosphorus 1.9, albumin  2.6, WBC 3.7, hemoglobin 9.2, platelets 46  No concerns with p.o. levofloxacin challenge yesterday.  DW RN  Assessment 83 year old female with prior history as below including MS, RA, squamous cell carcinoma and basal cell carcinoma, multiple right hip surgeries following 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 ciprofloxacin  for suppression who presented to the ED on 10/28 due to concern for rash.  Rash is reported as blisters in mouth as well as in her b/l arms and legs.     She was recently admitted on 10/17-10/18 after few days of p.o. ciprofloxacin  for similar blistering rash in her upper extremities and lower extremities bilaterally but no oral mucosal involvement.  There was concern for rash related to ciprofloxacin  but was discharged on ciprofloxacin  with advise for close monitoring.  Rash appeared to have resolved by the time of discharge.   # Recurrent blistering rash in upper and lower extremities including lips and oral cavity, concern for pemphigus vulgaris during admission with positive Nikolsky sign versus drug-induced rash related to ciprofloxacin  -IV methylprednisolone  changed to prednisone  40 mg p.o.  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  # Cough - triggered by swallowing  - high aspiration risk  - Primary check CXR  # Right hip PJI - s/p completion of IV cefepime  through 10/13 then transitioned to PO ciprofloxacin , held due to concerns for drug rash   # Acquired Pancytopenia - Oncology was consulted, multifactorial causes due to recurrent hospitalization, poor nutrition, spectrum antibiotics, recent infection, untreated and autoimmune disease - No plan for bone marrow biopsy - Supportive treatment with transfusion as needed - CBC improving   # Stage 3 sacral pressure wound  - continue wound care    # Rheumatoid arthritis-on prednisone  5 mg p.o. daily and methotrexate  12.5 mg weekly at home.  Methotrexate  currently on hold due to cytopenias. Prednisone  40 mg p.o. daily  # Multiple sclerosis - not on specific tx  Recommendations - Will start levofloxacin 500 mg p.o. daily for right hip PJI suppression for discharge, at least 3-6 months. Qtc 460. - Start valacyclovir  1g po bid for 7 days  - Monitor CBC - Steroid management per primary team - Orthopedics to evaluate right hip as missed her appointment this week for? suture removal - Follow-up arranged with Dr. Overton on 11/14 at 8: 30 am  - Universal/standard isolation precaution ID will sign off, recall back with questions or concerns  Rest of the management as per the primary team. Thank you for the consult. Please page with pertinent questions or concerns.  ______________________________________________________________________ Subjective patient seen and examined at the bedside.  Different caregiver at bedside today who especially cares for the patient during weekends.  She reports she had some mouth sores that developed last Friday but not as bad following Monday.  She has no  issues taking p.o. meds, diet except some concerns for aspiration. Rashes seems stable/improving   Vitals BP (!) 157/74  (BP Location: Left Arm)   Pulse 97   Temp (!) 97.5 F (36.4 C) (Oral)   Resp 19   Ht 5' 1 (1.549 m)   Wt 46.9 kg   SpO2 (!) 88%   BMI 19.54 kg/m     Physical Exam Constitutional: Elderly female sitting in the bed, not in acute distress    Comments: Sores in the lips, and ulcer in the oral cavity- improving  Cardiovascular:     Rate and Rhythm: Normal rate and regular rhythm.     Heart sounds:   Pulmonary:     Effort: Pulmonary effort is normal.     Comments:   Abdominal:     Palpations: Abdomen is nondistended    Tenderness:   Musculoskeletal:        General: No swelling or tenderness.   Skin:    Comments: Prior rashes in upper and lower extremities improving.  No new rashes, RT hip surgical site stable   Neurological:     General: Awake, alert and oriented  Psychiatric:        Mood and Affect: Mood normal.   Pertinent Microbiology Results for orders placed or performed during the hospital encounter of 03/22/24  Resp panel by RT-PCR (RSV, Flu A&B, Covid) Anterior Nasal Swab     Status: None   Collection Time: 03/22/24  1:42 PM   Specimen: Anterior Nasal Swab  Result Value Ref Range Status   SARS Coronavirus 2 by RT PCR NEGATIVE NEGATIVE Final    Comment: (NOTE) SARS-CoV-2 target nucleic acids are NOT DETECTED.  The SARS-CoV-2 RNA is generally detectable in upper respiratory specimens during the acute phase of infection. The lowest concentration of SARS-CoV-2 viral copies this assay can detect is 138 copies/mL. A negative result does not preclude SARS-Cov-2 infection and should not be used as the sole basis for treatment or other patient management decisions. A negative result may occur with  improper specimen collection/handling, submission of specimen other than nasopharyngeal swab, presence of viral mutation(s) within the areas targeted by this assay, and inadequate number of viral copies(<138 copies/mL). A negative result must be combined with clinical  observations, patient history, and epidemiological information. The expected result is Negative.  Fact Sheet for Patients:  bloggercourse.com  Fact Sheet for Healthcare Providers:  seriousbroker.it  This test is no t yet approved or cleared by the [place]  FDA and  has been authorized for detection and/or diagnosis of SARS-CoV-2 by FDA under an Emergency Use Authorization (EUA). This EUA will remain  in effect (meaning this test can be used) for the duration of the COVID-19 declaration under Section 564(b)(1) of the Act, 21 U.S.C.section 360bbb-3(b)(1), unless the authorization is terminated  or revoked sooner.       Influenza A by PCR NEGATIVE NEGATIVE Final   Influenza B by PCR NEGATIVE NEGATIVE Final    Comment: (NOTE) The Xpert Xpress SARS-CoV-2/FLU/RSV plus assay is intended as an aid in the diagnosis of influenza from Nasopharyngeal swab specimens and should not be used as a sole basis for treatment. Nasal washings and aspirates are unacceptable for Xpert Xpress SARS-CoV-2/FLU/RSV testing.  Fact Sheet for Patients: bloggercourse.com  Fact Sheet for Healthcare Providers: seriousbroker.it  This test is not yet approved or cleared by the [place]  FDA and has been authorized for detection and/or diagnosis of SARS-CoV-2 by FDA under an Emergency Use Authorization (EUA).  This EUA will remain in effect (meaning this test can be used) for the duration of the COVID-19 declaration under Section 564(b)(1) of the Act, 21 U.S.C. section 360bbb-3(b)(1), unless the authorization is terminated or revoked.     Resp Syncytial Virus by PCR NEGATIVE NEGATIVE Final    Comment: (NOTE) Fact Sheet for Patients: bloggercourse.com  Fact Sheet for Healthcare Providers: seriousbroker.it  This test is not yet approved or cleared by  the [place]  FDA and has been authorized for detection and/or diagnosis of SARS-CoV-2 by FDA under an Emergency Use Authorization (EUA). This EUA will remain in effect (meaning this test can be used) for the duration of the COVID-19 declaration under Section 564(b)(1) of the Act, 21 U.S.C. section 360bbb-3(b)(1), unless the authorization is terminated or revoked.  Performed at [facility], [address], [place], [place] [address]    *Note: Due to a large number of results and/or encounters for the requested time period, some results have not been displayed. A complete set of results can be found in Results Review.   Pertinent Lab    Latest Ref Rng & Units 03/25/2024    6:45 AM 03/24/2024    4:32 AM 03/23/2024    4:17 AM  CBC  WBC 4.0 - 10.5 K/uL 3.7  2.8  3.4   Hemoglobin 12.0 - 15.0 g/dL 9.2  9.2  9.5   Hematocrit 36.0 - 46.0 % 29.1  28.5  28.4   Platelets 150 - 400 K/uL 46  33  45       Latest Ref Rng & Units 03/25/2024    4:25 AM 03/24/2024    4:32 AM 03/23/2024    4:17 AM  CMP  Glucose 70 - 99 mg/dL 815  55  73   BUN 8 - 23 mg/dL 8  <5  <5   Creatinine 0.44 - 1.00 mg/dL 9.45  9.53  9.49   Sodium 135 - 145 mmol/L 129  133  132   Potassium 3.5 - 5.1 mmol/L 4.1  3.4  3.7   Chloride 98 - 111 mmol/L 98  98  99   CO2 22 - 32 mmol/L 22  23  27    Calcium  8.9 - 10.3 mg/dL 7.6  7.3  7.3   Total Protein 6.5 - 8.1 g/dL 5.3     Total Bilirubin 0.0 - 1.2 mg/dL 0.5     Alkaline Phos 38 - 126 U/L 55     AST 15 - 41 U/L 22     ALT 0 - 44 U/L 11      Pertinent Imaging today Plain films and CT images have been personally visualized and interpreted; radiology reports have been reviewed. Decision making incorporated into the Impression /   No results found.  I spent 50 minutes involved in face-to-face and non-face-to-face activities for this patient on the day of the visit. Professional time spent includes the following activities: Preparing to see the  patient (review of tests), Obtaining and reviewing separately obtained history (Hospitalist progress note), Performing a medically appropriate examination and evaluation, Ordering medications/labs, referring and communicating with other health care professionals, Documenting clinical information in the EMR, Independently interpreting results (not separately reported), Communicating results to the patient/daughter, Counseling and educating the patient/daughter and Care coordination (not separately reported).   Plan d/w requesting provider as well as ID pharm D  Of note, portions of this note may have been created with voice recognition software. While this note has been edited for accuracy,  occasional wrong-word or 'sound-a-like' substitutions may have occurred due to the inherent limitations of voice recognition software.   Electronically signed by:   Annalee Orem, MD Infectious Disease Physician [facility] Pager: [phone]

## 2024-03-25 NOTE — TOC Initial Note (Signed)
 Transition of Care [facility]) - Initial/Assessment Note    Patient Details  Name: Sherri [name] MRN: [id number] Date of Birth: 13-Dec-1940  Transition of Care [facility]) CM/SW Contact:    Tawni CHRISTELLA Eva, LCSW Phone Number: 03/25/2024, 4:02 PM  Clinical Narrative:                  CSW spoke with the pt's daughter, Suzen. Pt is from home and resides with her husband. Pt's daughter reports the pt has 24-hour aide services in the home and is active with [facility] for home health services.  She stated she would only like the pt to continue speech therapy at this time. Pt will need home health orders placed. She reports no DME needs. The daughter also reported that the pt's caretaker and brother may be able to provide transportation upon discharge. Care Management to follow for d/c needs.  Expected Discharge Plan: Home w Home Health Services Barriers to Discharge: Continued Medical Work up   Patient Goals and CMS Choice Patient states their goals for this hospitalization and ongoing recovery are:: retrun home CMS Medicare.gov Compare Post Acute Care list provided to:: Patient Represenative (must comment) Choice offered to / list presented to : Adult Children      Expected Discharge Plan and Services                                              Prior Living Arrangements/Services                       Activities of Daily Living   ADL Screening (condition at time of admission) Independently performs ADLs?: No Does the patient have a NEW difficulty with bathing/dressing/toileting/self-feeding that is expected to last >3 days?: No Does the patient have a NEW difficulty with getting in/out of bed, walking, or climbing stairs that is expected to last >3 days?: No Does the patient have a NEW difficulty with communication that is expected to last >3 days?: No Is the patient deaf or have difficulty hearing?: Yes Does the patient have difficulty seeing, even when  wearing glasses/contacts?: No Does the patient have difficulty concentrating, remembering, or making decisions?: No  Permission Sought/Granted                  Emotional Assessment              Admission diagnosis:  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] Hypokalemia [E87.6] Pancytopenia (HCC) [D61.818] Symptomatic anemia [D64.9] Patient Active Problem List   Diagnosis Date Noted   Pancytopenia (HCC) 03/22/2024   Rash and nonspecific skin eruption 03/11/2024   Rash 03/10/2024   Drug reaction 03/10/2024   Peeling skin 03/10/2024   Prosthetic joint infection 02/15/2024   HOH (hard of hearing)    Foley catheter in place 02/03/2024   Prosthetic hip infection 01/25/2024   Pseudomonas aeruginosa infection 01/25/2024   Hip dislocation, right (HCC) 01/19/2024   Chronic pain 01/19/2024   Anemia of chronic disease 12/31/2023   Multiple skin tears 12/31/2023   Debility 12/08/2023   History of revision of total replacement of right hip joint 12/08/2023   Postoperative wound infection 12/08/2023   Closed fracture of right elbow 12/08/2023   Wound dehiscence 11/27/2023   Macrocytic anemia 11/27/2023   Cellulitis of right elbow 11/27/2023   Lightheadedness 11/10/2023   Physical deconditioning 11/10/2023  Parotid swelling 11/10/2023   Infected laceration of skin 11/10/2023   Skin tear of elbow without complication, right, initial encounter 11/10/2023   Hip fracture (HCC) 11/10/2023   Pressure injury of skin 10/03/2023   Hyponatremia 10/02/2023   Fall at home, initial encounter 10/02/2023   Cellulitis of right lower extremity 10/02/2023   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 12/24/2022   Cervical spondylosis with myelopathy and radiculopathy 04/23/2022   Constipation 03/04/2022   Upper back pain on left side 03/04/2022   Lumbar vertebral fracture, pathologic 09/20/2021   Degenerative spondylolisthesis 08/19/2021   Senile purpura 12/19/2020   Restless leg 02/02/2020    Bilateral leg edema 01/11/2020   Cervical radiculopathy at C8 06/28/2019   Carpal tunnel syndrome on both sides 06/28/2019   Right sided sciatica 04/14/2018   Trochanteric bursitis of right hip 04/14/2018   H/O cervical spine surgery 11/10/2017   Dysphagia 10/26/2017   GERD (gastroesophageal reflux disease) 08/06/2017   Easy bruising 08/06/2017   Mid back pain 03/26/2017   Submandibular gland swelling 01/08/2017   High risk medication use 09/02/2016   Piriformis muscle pain 09/02/2016   Right hip pain 04/08/2016   Right knee pain 02/25/2016   Numbness and tingling in left hand 11/14/2014   Biceps tendon tear 10/10/2014   Insomnia 10/10/2014   Gait disturbance 06/13/2014   OP (osteoporosis) 01/18/2014   Other long term (current) drug therapy 01/18/2014   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 01/01/2013   History of colonic polyps 07/03/2010   Nocturia 05/28/2010   THYROID  NODULE 05/17/2008   Multiple sclerosis 05/17/2008   Other fatigue 03/30/2007   Hyperlipidemia 08/20/2006   Essential hypertension 08/20/2006   Rheumatoid arthritis (HCC) 08/20/2006   Osteoarthritis 08/20/2006   CERVICAL CANCER, HX OF 08/20/2006   GASTRIC ULCER, HX OF 08/20/2006   PCP:  [name] PARAS, MD Pharmacy:   [facility] [place], [place] - [address] [place] [place] [address] Phone: [phone] Fax: [phone]     Social Drivers of Health (SDOH) Social History: SDOH Screenings   Food Insecurity: No Food Insecurity (03/23/2024)  Housing: Low Risk  (03/23/2024)  Transportation Needs: No Transportation Needs (03/23/2024)  Utilities: Not At Risk (03/23/2024)  Alcohol  Screen: Low Risk  (02/21/2022)  Depression (PHQ2-9): Low Risk  (07/27/2023)  Financial Resource Strain: Low Risk  (02/21/2022)  Physical Activity: Inactive (02/21/2022)  Social Connections: Socially Integrated (03/23/2024)  Stress: Stress Concern Present (02/21/2022)   Tobacco Use: Medium Risk (03/23/2024)   SDOH Interventions:     Readmission Risk Interventions     No data to display

## 2024-03-26 DIAGNOSIS — D61818 Other pancytopenia: Secondary | ICD-10-CM | POA: Diagnosis not present

## 2024-03-26 LAB — CBC
HCT: 27.8 % — ABNORMAL LOW (ref 36.0–46.0)
Hemoglobin: 8.9 g/dL — ABNORMAL LOW (ref 12.0–15.0)
MCH: 29.3 pg (ref 26.0–34.0)
MCHC: 32 g/dL (ref 30.0–36.0)
MCV: 91.4 fL (ref 80.0–100.0)
Platelets: 75 K/uL — ABNORMAL LOW (ref 150–400)
RBC: 3.04 MIL/uL — ABNORMAL LOW (ref 3.87–5.11)
RDW: 20.5 % — ABNORMAL HIGH (ref 11.5–15.5)
WBC: 4.2 K/uL (ref 4.0–10.5)
nRBC: 0 % (ref 0.0–0.2)

## 2024-03-26 LAB — BASIC METABOLIC PANEL WITH GFR
Anion gap: 9 (ref 5–15)
BUN: 5 mg/dL — ABNORMAL LOW (ref 8–23)
CO2: 23 mmol/L (ref 22–32)
Calcium: 7.4 mg/dL — ABNORMAL LOW (ref 8.9–10.3)
Chloride: 102 mmol/L (ref 98–111)
Creatinine, Ser: 0.48 mg/dL (ref 0.44–1.00)
GFR, Estimated: >60 mL/min (ref >60)
Glucose, Bld: 85 mg/dL (ref 70–99)
Potassium: 3.6 mmol/L (ref 3.5–5.1)
Sodium: 135 mmol/L (ref 135–145)

## 2024-03-26 LAB — GLUCOSE, CAPILLARY
Glucose-Capillary: 64 mg/dL — ABNORMAL LOW (ref 70–99)
Glucose-Capillary: 73 mg/dL (ref 70–99)
Glucose-Capillary: 110 mg/dL — ABNORMAL HIGH (ref 70–99)
Glucose-Capillary: 114 mg/dL — ABNORMAL HIGH (ref 70–99)
Glucose-Capillary: 125 mg/dL — ABNORMAL HIGH (ref 70–99)

## 2024-03-26 LAB — HSV DNA BY PCR (REFERENCE LAB)
HSV 1 DNA: POSITIVE — AB
HSV 2 DNA: NEGATIVE

## 2024-03-26 LAB — PHOSPHORUS: Phosphorus: 2.2 mg/dL — ABNORMAL LOW (ref 2.5–4.6)

## 2024-03-26 LAB — MAGNESIUM: Magnesium: 2 mg/dL (ref 1.7–2.4)

## 2024-03-26 MED ORDER — DEXTROSE IN LACTATED RINGERS 5 % IV SOLN: INTRAVENOUS | Status: DC

## 2024-03-26 NOTE — Progress Notes (Signed)
 Progress Note   Patient: Sherri [name]:[id number] DOB: Nov 23, 1940 DOA: 03/22/2024     4 DOS: the patient was seen and examined on 03/26/2024   Brief hospital course: HPI on admission: Sherri  [name] is a 83 y.o. female with medical history significant for rheumatoid arthritis, MS, hyperlipidemia, multiple orthopedic surgeries including right hip arthroplasty who has undergone surgical revision of her right hip arthroplasty, has chronic osteomyelitis and is now being admitted to the hospital with recurrent rash concerning for drug reaction.  She was discharged from [facility] on 10/18 after being admitted for itchy rash on her extremities.  Her primary caregiver is at the bedside and assists significantly with providing the history.  It seems that the patient was on IV cefepime  via PICC line several weeks in the past, this was transitioned to p.o. ciprofloxacin .  She developed itchy rash over her entire body similar to the one she has now, when she started the ciprofloxacin  and was admitted to [facility].  While in the hospital, it seems the p.o. ciprofloxacin  was held and the rash completely resolved.  She resumed the ciprofloxacin  a couple weeks ago when she left the hospital, and the rash that we see now recurred.  On evaluation today in the ER, she also has pancytopenia.  1 unit PRBC has been ordered.    See H&P for full HPI on admission & ED course.  Consults: Hemetology/Oncology Infectious Disease   Assessment and Plan:  Rash -- unclear etiology, but given this is 2nd time rash after taking ciprofloxacin , drug reaction highly suspected.  Rash may also be related to autoimmune disease (pemphigus vulgaris) but time course would definitely favor ciprofloxacin  reaction. 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  On my review chest x-ray appears similar than the one on 10/28.  Patient has cough related to swallowing but is afebrile and clinically not showing signs of pneumonia -- Appreciate SLP swallow evaluation and diet recommendation -- Aspiration precautions -- As needed antitussives, mucolytic's  Hypokalemia -resolved with replacement Hypomagnesemia -resolved with replacement Hypophosphatemia -Phos 2.2 Hypocalcemia - Ca 7.6 -- Oral Phos-Nak x 2 days -- Monitor and replace electrolytes  Hyponatremia -sodium down to 129 today, likely from poor p.o. intake -- Change maintenance IV fluids to NS -- Monitor BMP  Chronic osteomyelitis of right hip-requested ID consultation for antibiotic management recommendations.  Avoid Cipro  --Appreciate ID input  Status post right total hip arthroplasty --Consult orthopedic for any specific activity restrictions --PT OT evaluations --Fall precautions   Acute anemia superimposed on anemia of chronic disease, no evidence of blood loss. -Transfused 1 unit PRBC for initial Hbg 6.4 Hbg today 9.5 -Goal hemoglobin greater than 7   Pancytopenia-unclear etiology, potentially related to drug reaction, autoimmune condition related to her RA, etc. WBC normalized.  Platelets  improving.  Hemoglobin stable after transfusions on admission --Appreciate Heme-Oncology consult --Monitor CBC --Transfusions of RBC's or platelets as  needed   Elevated troponin- demand ischemia in setting of severe anemia EKG no acute ischemic changes, patient denies any chest pain.  Potentially related to heart strain from her significant anemia? -Trend troponin -Monitor on telemetry  Rheumatoid arthritis --appears stable without flare On methotrexate  and 5 mg prednisone . -- Methotrexate  on hold with cytopenias --Increased prednisone  to 40 mg for now      Subjective: Pt seen with caregiver at bedside this morning.  Patient reports ongoing throat and mouth pain, not tolerating much p.o. intake at all.  The caregiver at bedside does report her lower lip looks improved today.  Patient otherwise reports feeling okay and denies acute complaints.   Physical Exam: Vitals:   03/25/24 0531 03/25/24 1335 03/25/24 2033 03/26/24 0442  BP: (!) 157/74 122/74 129/78 137/72  Pulse: 97 (!) 108 (!) 101 96  Resp: 19 20 16 16   Temp: (!) 97.5 F (36.4 C) 97.8 F (36.6 C) 97.8 F (36.6 C) 97.8 F (36.6 C)  TempSrc: Oral Oral Oral Oral  SpO2: (!) 88% 97% 95% 96%  Weight:      Height:       General exam: Awake and alert, no acute distress, frail, chronically ill-appearing HEENT: Patient's lower lip crusting blistering seems improved today, I did not visualize the oropharynx directly today Respiratory system: Normal respiratory effort, on room air, lungs clear Cardiovascular system: normal S1/S2, RRR, no peripheral edema Gastrointestinal system: soft, NT, ND Central nervous system: A&O x 2+. no gross focal neurologic deficits, normal speech Skin: Purpuric rash on chest and shoulders appears similar but slowly fading no new lesions noted.  Scattered ecchymosis on all extremities.  Multiple foam padded dressings on extremities and pressure areas Psychiatry: normal mood, congruent affect  Data Reviewed:  Notable labs --   CBGs trend: 136>> 64>> 73>> 110  Na 133 >> 129 >> 135 normalized Ca 7. 4 Phos 2.2  WBC 3.4 >> 2.8 >> 3.7 >> 4.2  normalized Hbg 9.5>> 9.2 >> 9.2 >> 8.9 stable Platelets 45 >> 33 >> 46 >> 75 k   Family Communication: Caregiver at bedside on rounds.  Husband updated on caregivers cell phone during encounter  Disposition: Status is: Inpatient Inadequate p.o. intake secondary to mouth and throat pain, requiring IV fluids   Planned Discharge Destination: Home    Time spent: 45 minutes  Author: Burnard DELENA Cunning, DO 03/26/2024 2:48 PM  For on call review [email or web address].

## 2024-03-26 NOTE — Plan of Care (Signed)

## 2024-03-27 DIAGNOSIS — D61818 Other pancytopenia: Secondary | ICD-10-CM | POA: Diagnosis not present

## 2024-03-27 LAB — CBC
HCT: 27.4 % — ABNORMAL LOW (ref 36.0–46.0)
Hemoglobin: 8.7 g/dL — ABNORMAL LOW (ref 12.0–15.0)
MCH: 29.4 pg (ref 26.0–34.0)
MCHC: 31.8 g/dL (ref 30.0–36.0)
MCV: 92.6 fL (ref 80.0–100.0)
Platelets: 119 K/uL — ABNORMAL LOW (ref 150–400)
RBC: 2.96 MIL/uL — ABNORMAL LOW (ref 3.87–5.11)
RDW: 20 % — ABNORMAL HIGH (ref 11.5–15.5)
WBC: 3.1 K/uL — ABNORMAL LOW (ref 4.0–10.5)
nRBC: 0 % (ref 0.0–0.2)

## 2024-03-27 LAB — BASIC METABOLIC PANEL WITH GFR
Anion gap: 7 (ref 5–15)
BUN: <5 mg/dL — ABNORMAL LOW (ref 8–23)
CO2: 24 mmol/L (ref 22–32)
Calcium: 7.1 mg/dL — ABNORMAL LOW (ref 8.9–10.3)
Chloride: 102 mmol/L (ref 98–111)
Creatinine, Ser: 0.4 mg/dL — ABNORMAL LOW (ref 0.44–1.00)
GFR, Estimated: >60 mL/min (ref >60)
Glucose, Bld: 82 mg/dL (ref 70–99)
Potassium: 3.1 mmol/L — ABNORMAL LOW (ref 3.5–5.1)
Sodium: 133 mmol/L — ABNORMAL LOW (ref 135–145)

## 2024-03-27 LAB — GLUCOSE, CAPILLARY
Glucose-Capillary: 71 mg/dL (ref 70–99)
Glucose-Capillary: 94 mg/dL (ref 70–99)
Glucose-Capillary: 118 mg/dL — ABNORMAL HIGH (ref 70–99)
Glucose-Capillary: 113 mg/dL — ABNORMAL HIGH (ref 70–99)

## 2024-03-27 LAB — PHOSPHORUS: Phosphorus: 2.4 mg/dL — ABNORMAL LOW (ref 2.5–4.6)

## 2024-03-27 MED ORDER — CALCIUM GLUCONATE-NACL 1-0.675 GM/50ML-% IV SOLN
1.0000 g | Freq: Once | INTRAVENOUS | Status: AC
  Administered 2024-03-27: 1000 mg via INTRAVENOUS
  Filled 2024-03-27: qty 50

## 2024-03-27 MED ORDER — POTASSIUM CHLORIDE 20 MEQ PO PACK
40.0000 meq | PACK | ORAL | Status: AC
  Administered 2024-03-27 (×2): 40 meq via ORAL
  Filled 2024-03-27 (×2): qty 2

## 2024-03-27 MED ORDER — POTASSIUM CHLORIDE CRYS ER 20 MEQ PO TBCR
40.0000 meq | EXTENDED_RELEASE_TABLET | ORAL | Status: DC
Start: 2024-03-27 — End: 2024-03-27
  Filled 2024-03-27: qty 2

## 2024-03-27 NOTE — Progress Notes (Addendum)
 Progress Note   Patient: Sherri [name]:[id number] DOB: January 14, 1941 DOA: 03/22/2024     5 DOS: the patient was seen and examined on 03/27/2024   Brief hospital course: HPI on admission: Sherri [name] is a 83 y.o. female with medical history significant for rheumatoid arthritis, MS, hyperlipidemia, multiple orthopedic surgeries including right hip arthroplasty who has undergone surgical revision of her right hip arthroplasty, has chronic osteomyelitis and is now being admitted to the hospital with recurrent rash concerning for drug reaction.  She was discharged from [facility] on 10/18 after being admitted for itchy rash on her extremities.  Her primary caregiver is at the bedside and assists significantly with providing the history.  It seems that the patient was on IV cefepime  via PICC line several weeks in the past, this was transitioned to p.o. ciprofloxacin .  She developed itchy rash over her entire body similar to the one she has now, when she started the ciprofloxacin  and was admitted to [facility]  While in the hospital, it seems the p.o. ciprofloxacin  was held and the rash completely resolved.  She resumed the ciprofloxacin  a couple weeks ago when she left the hospital, and the rash that we see now recurred.  On evaluation today in the ER, she also has pancytopenia.  1 unit PRBC has been ordered.    See H&P for full HPI on admission & ED course.  Consults: Hemetology/Oncology Infectious Disease   Assessment and Plan:  Rash -- unclear etiology, but given this is 2nd time rash after taking ciprofloxacin , drug reaction highly suspected.  Rash may also be related to autoimmune disease (pemphigus vulgaris) but time course would definitely favor ciprofloxacin  reaction. 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  On my review chest x-ray appears similar than the one on 10/28.  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  Avoid Cipro  --Appreciate ID input  Status post right total hip arthroplasty --Consult orthopedic for any specific activity restrictions --PT OT evaluations --Fall precautions   Acute anemia superimposed on anemia of chronic disease, no evidence of blood loss. sfusionTransfused 1 unit PRBC for initial Hbg 6.4 Hbg today 8.7 Hemoglobin has remained stable since  -Goal hemoglobin greater than 7   Pancytopenia-unclear etiology, potentially related to drug reaction,  autoimmune condition related to her RA, etc. WBC normalized.  Platelets improving.  Hemoglobin stable after transfusions on admission --Appreciate Heme-Oncology  consult --Monitor CBC --Transfusions of RBC's or platelets as needed   Elevated troponin- demand ischemia in setting of severe anemia EKG no acute ischemic changes, patient denies any chest pain.  Potentially related to heart strain from her significant anemia? -Trend troponin -Monitor on telemetry  Rheumatoid arthritis --appears stable without flare On methotrexate  and 5 mg prednisone . -- Methotrexate  on hold with cytopenias --Increased prednisone  to 40 mg for now  Severe malnutrition  Due to chronic illnesses and deconditioning. As evidenced by severe fat and muscle depletion --Maximize caloric & protein intake --RD consult  Pressure injuries of skin - POA I agree with the wound description/s as outlined below --Appreciate wound care RN's recommendation --Continue wound care --Offload pressure areas --Reposition every 2 hours Wound 03/22/24 2327 Pressure Injury Buttocks Left Stage 3 -  Full thickness tissue loss. Subcutaneous fat may be visible but bone, tendon or muscle are NOT exposed. (Active)     Wound 03/22/24 2347 Pressure Injury Buttocks Left;Mid Stage 3 -  Full thickness tissue loss. Subcutaneous fat may be visible but bone, tendon or muscle are NOT exposed. (Active)         Subjective: Pt seen with caregiver at bedside this morning.  Patient reports her mouth still hurts.  She did better eating later yesterday and so far this morning.  She is asking when she can go home and we discussed having her on IV fluids with sugar in order to prevent low blood sugar from her not eating and drinking enough.  She and caregiver agreeable to try off of IV fluids and hopeful discharge home tomorrow.   Physical Exam: Vitals:   03/26/24 0442 03/26/24 1900 03/26/24 2146 03/27/24 0448  BP: 137/72 (!) 140/67 130/79 (!) 155/76  Pulse: 96 95 96 97  Resp: 16 16 17 16   Temp: 97.8 F (36.6 C) 98.3 F (36.8 C) (!) 97.5 F (36.4 C) 98 F (36.7 C)  TempSrc: Oral Oral Oral Oral  SpO2:  96%  96% 97%  Weight:      Height:       General exam: Awake and alert, no acute distress, frail, chronically ill-appearing HEENT: Oral ulcerations and lower lip crusting and blistering appear significantly improved Respiratory system: Normal respiratory effort, on room air, lungs clear no wheezes or rhonchi Cardiovascular system: normal S1/S2, RRR, no peripheral edema Gastrointestinal system: soft, NT, ND Central nervous system: A&O x 2+. no gross focal neurologic deficits, normal speech Skin: Purpuric rash on chest and shoulders appears similar -no apparent new lesions or spreading at this point   scattered ecchymosis on all extremities.  Multiple foam padded dressings on extremities and pressure areas Psychiatry: normal mood, congruent affect  Data Reviewed:  Notable labs --   CBGs trend: 110 >> 114 >> 125>> 71 >> 94  Na 133 K3.1 BUN less than 5 Creatinine 0.40 Calcium  7.1 Phos 2.4 Ca 7. 4 Phos 2.2  WBC 3.4 >> 2.8 >> 3.7 >> 4.2 >> 3.1 Hbg 9.5>> 9.2 >> 9.2 >> 8.9 >> 8.7 Platelets 45 >> 33 >> 46 >> 75 >> 119 k   Family Communication: Caregiver at bedside on rounds.     Disposition: Status is: Inpatient Need to monitor for adequate p.o. intake while off of IV fluids, hopeful to discharge in 24 to 48 hours   Planned Discharge Destination: Home    Time spent: 42 minutes  Author: Burnard DELENA Cunning, DO 03/27/2024 12:59 PM  For on call review [email or web address].

## 2024-03-28 DIAGNOSIS — D61818 Other pancytopenia: Secondary | ICD-10-CM | POA: Diagnosis not present

## 2024-03-28 LAB — CBC
HCT: 29.9 % — ABNORMAL LOW (ref 36.0–46.0)
Hemoglobin: 9.7 g/dL — ABNORMAL LOW (ref 12.0–15.0)
MCH: 30.2 pg (ref 26.0–34.0)
MCHC: 32.4 g/dL (ref 30.0–36.0)
MCV: 93.1 fL (ref 80.0–100.0)
Platelets: 192 K/uL (ref 150–400)
RBC: 3.21 MIL/uL — ABNORMAL LOW (ref 3.87–5.11)
RDW: 20.1 % — ABNORMAL HIGH (ref 11.5–15.5)
WBC: 2.9 K/uL — ABNORMAL LOW (ref 4.0–10.5)
nRBC: 0 % (ref 0.0–0.2)

## 2024-03-28 LAB — GLUCOSE, CAPILLARY
Glucose-Capillary: 70 mg/dL (ref 70–99)
Glucose-Capillary: 65 mg/dL — ABNORMAL LOW (ref 70–99)
Glucose-Capillary: 124 mg/dL — ABNORMAL HIGH (ref 70–99)
Glucose-Capillary: 111 mg/dL — ABNORMAL HIGH (ref 70–99)
Glucose-Capillary: 147 mg/dL — ABNORMAL HIGH (ref 70–99)

## 2024-03-28 LAB — BASIC METABOLIC PANEL WITH GFR
Anion gap: 9 (ref 5–15)
BUN: <5 mg/dL — ABNORMAL LOW (ref 8–23)
CO2: 22 mmol/L (ref 22–32)
Calcium: 7.7 mg/dL — ABNORMAL LOW (ref 8.9–10.3)
Chloride: 102 mmol/L (ref 98–111)
Creatinine, Ser: 0.42 mg/dL — ABNORMAL LOW (ref 0.44–1.00)
GFR, Estimated: >60 mL/min (ref >60)
Glucose, Bld: 79 mg/dL (ref 70–99)
Potassium: 4.2 mmol/L (ref 3.5–5.1)
Sodium: 134 mmol/L — ABNORMAL LOW (ref 135–145)

## 2024-03-28 MED ORDER — CHLORHEXIDINE GLUCONATE 0.12 % MT SOLN
15.0000 mL | Freq: Four times a day (QID) | OROMUCOSAL | Status: DC
  Administered 2024-03-28 – 2024-03-29 (×4): 15 mL via OROMUCOSAL
  Filled 2024-03-28 (×4): qty 15

## 2024-03-28 MED ORDER — SODIUM PHOSPHATES 45 MMOLE/15ML IV SOLN
10.0000 mmol | Freq: Once | INTRAVENOUS | Status: AC
  Administered 2024-03-28: 10 mmol via INTRAVENOUS
  Filled 2024-03-28: qty 3.33

## 2024-03-28 MED ORDER — POTASSIUM PHOSPHATES 15 MMOLE/5ML IV SOLN: 30.0000 mmol | Freq: Once | INTRAVENOUS | Status: DC

## 2024-03-28 MED ORDER — DEXTROSE 50 % IV SOLN
12.5000 g | INTRAVENOUS | Status: AC
  Administered 2024-03-28: 12.5 g via INTRAVENOUS
  Filled 2024-03-28: qty 50

## 2024-03-28 MED ORDER — ENSURE PLUS HIGH PROTEIN PO LIQD: 237.0000 mL | Freq: Three times a day (TID) | ORAL | Status: DC

## 2024-03-28 MED ORDER — PREDNISONE 20 MG PO TABS
30.0000 mg | ORAL_TABLET | Freq: Every day | ORAL | Status: DC
  Administered 2024-03-28 – 2024-03-29 (×2): 30 mg via ORAL
  Filled 2024-03-28 (×2): qty 1

## 2024-03-28 MED ORDER — PREDNISONE 20 MG PO TABS
30.0000 mg | ORAL_TABLET | Freq: Every day | ORAL | Status: DC
Start: 2024-03-29 — End: 2024-03-28

## 2024-03-28 NOTE — Progress Notes (Addendum)
 PROGRESS NOTE    Sherri [name]  FMW:[id number] DOB: [birth year] DOA: 03/22/2024 PCP: [name] PARAS, MD   Brief Narrative: 83 y.o. female with medical history significant for rheumatoid arthritis, MS, hyperlipidemia, multiple orthopedic surgeries including right hip arthroplasty who has undergone surgical revision of her right hip arthroplasty, has chronic osteomyelitis and is now being admitted to the hospital with recurrent rash concerning for drug reaction.  She was discharged from [facility] on 10/18 after being admitted for itchy rash on her extremities.  Her primary caregiver is at the bedside and assists significantly with providing the history.  It seems that the patient was on IV cefepime  via PICC line several weeks in the past, this was transitioned to p.o. ciprofloxacin .  She developed itchy rash over her entire body similar to the one she has now, when she started the ciprofloxacin  and was admitted to [facility].  While in the hospital, it seems the p.o. ciprofloxacin  was held and the rash completely resolved.  She resumed the ciprofloxacin  a couple weeks ago when she left the hospital, and the rash that we see now recurred.  On evaluation today in the ER, she also has pancytopenia.  1 unit PRBC has been ordered.      Assessment & Plan:   Principal Problem:   Pancytopenia (HCC) Hypoglycemia due to poor p.o. intake from oral ulcers patient was on D5 since yesterday 03/27/2024 CBG (last 3)  Recent Labs    03/27/24 1702 03/27/24 2004 03/28/24 0727  GLUCAP 118* 113* 70   CBG 70 this morning encourage p.o. intake start Ensure 3 times daily chlorhexidine  mouthwash Will plan discharge home once hypoglycemia is resolved and she is able to tolerate p.o. intake  Rash -- unclear etiology, but given this is 2nd time rash after taking ciprofloxacin , drug reaction highly suspected.  Rash may also be related to autoimmune disease (pemphigus vulgaris) but time course would definitely favor  ciprofloxacin  reaction. 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  On my review chest x-ray appears similar than the one on 10/28.  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  Avoid Cipro  --Appreciate ID input   Status post right total hip arthroplasty --Consult orthopedic for any specific activity  restrictions --PT OT evaluations --Fall precautions  Acute anemia superimposed on anemia of chronic disease, no evidence of blood loss. Transfused 1 unit PRBC for initial Hbg 6.4 Hbg today 8.7 Hemoglobin has remained stable since  -Goal hemoglobin  greater than 7   Pancytopenia-unclear etiology, potentially related to drug reaction, autoimmune condition related to her RA, etc. WBC normalized.  Platelets improving.  Hemoglobin stable after transfusions on admission --Appreciate Heme-Oncology consult --Monitor CBC --Transfusions of RBC's or platelets as needed   Elevated troponin- demand ischemia in setting of severe anemia EKG no acute ischemic changes, patient denies any chest pain.  Potentially related to heart strain from her significant anemia? -Trend troponin -Monitor on telemetry   Rheumatoid arthritis --appears stable without flare On methotrexate  and 5 mg prednisone . -- Methotrexate  on hold with cytopenias --Increased prednisone  to 40 mg for now   Severe malnutrition  Due to chronic illnesses and deconditioning. As evidenced by severe fat and muscle depletion --Maximize caloric & protein intake --RD consult   Pressure injuries of skin - POA I agree with the wound description/s as outlined below --Appreciate wound care RN's recommendation --Continue wound care --Offload pressure areas --Reposition every 2 hours  Wound 03/22/24 2327 Pressure Injury Buttocks Left Stage 3 -  Full thickness tissue loss. Subcutaneous fat may be visible but bone, tendon or muscle are NOT exposed. (Active)     Wound 03/22/24 2347 Pressure Injury Buttocks Left;Mid Stage 3 -  Full thickness tissue loss. Subcutaneous fat may be visible but bone, tendon or muscle are NOT exposed. (Active)    Estimated body mass index is 19.54 kg/m as calculated from the following:   Height as of this encounter: 5' 1 (1.549 m).   Weight as of this encounter: 46.9 kg.  DVT prophylaxis:scd Code Status:full Family Communication:  caregiver at bedside Disposition Plan:  Status is: Inpatient Remains inpatient appropriate because: acute illness   Consultants: id onc ortho  Procedures: none Antimicrobials:  Anti-infectives (From admission, onward)     Start     Dose/Rate Route Frequency Ordered Stop   03/26/24 1000  levofloxacin (LEVAQUIN) 25 MG/ML solution 500 mg       Note to Pharmacy: Cross reactivity low between levofloxacin and ciprofloxacin  . Patient ok to challenge   500 mg Oral Daily 03/25/24 0918     03/25/24 1700  valACYclovir  (VALTREX ) tablet 1,000 mg        1,000 mg Oral 2 times daily 03/25/24 1629     03/24/24 1445  levofloxacin (LEVAQUIN) 25 MG/ML solution 250 mg  Status:  Discontinued       Note to Pharmacy: Cross reactivity low between levofloxacin and ciprofloxacin  . Patient ok to challenge   250 mg Oral Daily 03/24/24 1358 03/25/24 0918   03/22/24 2000  ciprofloxacin  (CIPRO ) tablet 500 mg  Status:  Discontinued        500 mg Oral 2 times daily 03/22/24 1653 03/22/24 1702        Subjective: Resting in bed Caregiver at bedside mouth sores improving D5 stopped CBG this morning is 72 p.o. intake is still poor  Objective: Vitals:   03/27/24 1312 03/27/24 2005 03/28/24 0620 03/28/24 0935  BP: (!) 147/84 (!) 143/70 (!) 150/89 (!) 154/63  Pulse: (!) 104 (!) 109 99 (!) 104  Resp: 16   16  Temp: 98 F (36.7 C) 97.7 F (36.5 C) 97.7 F (36.5 C) 97.9 F (36.6 C)  TempSrc:  Oral Oral Oral  SpO2: 98% 96% 98% 100%  Weight:      Height:        Intake/Output Summary (Last 24 hours) at 03/28/2024 0936 Last data filed at 03/28/2024 9071 Gross per 24 hour  Intake 510 ml  Output 2350 ml  Net -1840 ml   Filed Weights   03/22/24 1851  Weight: 46.9 kg    Examination:  General exam: Appears in nad Respiratory system: Clear to auscultation. Respiratory effort normal. Cardiovascular system: S1 & S2 heard, RRR. No JVD, murmurs, rubs, gallops or clicks. No pedal edema. Gastrointestinal system: Abdomen is nondistended, soft and nontender. No organomegaly or masses felt. Normal bowel sounds heard. Central nervous system: Alert and oriented. No focal neurological deficits. Extremities: edema Skin rash noted on the chest  and shoulders improved    Data Reviewed: I have personally reviewed following labs and imaging studies  CBC: Recent Labs  Lab 03/22/24 1507 03/22/24 1527 03/24/24 0432 03/25/24 0645 03/26/24 0416 03/27/24 0357 03/28/24 0402  WBC 2.3*   < > 2.8* 3.7* 4.2 3.1* 2.9*  NEUTROABS 1.6*  --   --  2.6  --   --   --   HGB 6.4*   < > 9.2* 9.2* 8.9* 8.7* 9.7*  HCT 20.3*   < > 28.5* 29.1* 27.8* 27.4* 29.9*  MCV 92.7   < > 90.2 90.9 91.4 92.6 93.1  PLT 55*   < > 33* 46* 75* 119* 192   < > = values in this interval not displayed.   Basic Metabolic Panel: Recent Labs  Lab 03/24/24 0432 03/25/24 0425 03/26/24 0416 03/27/24 0357 03/28/24 0402  NA 133* 129* 135 133* 134*  K 3.4* 4.1 3.6 3.1* 4.2  CL 98 98 102 102 102  CO2 23 22 23 24 22   GLUCOSE 55* 184* 85 82 79  BUN <5* 8 5* <5* <5*  CREATININE 0.46 0.54 0.48 0.40* 0.42*  CALCIUM  7.3* 7.6* 7.4* 7.1* 7.7*  MG 1.5* 2.1 2.0  --   --   PHOS  --  1.9* 2.2* 2.4*  --    GFR: Estimated Creatinine Clearance: 40.1 mL/min (A) (by C-G formula based on SCr of 0.42 mg/dL (L)). Liver Function Tests: Recent Labs  Lab 03/22/24 1507 03/25/24 0425  AST 22 22  ALT 8 11  ALKPHOS 51 55  BILITOT 0.4 0.5  PROT 4.9* 5.3*  ALBUMIN  2.5* 2.6*   Recent Labs  Lab 03/22/24 1507  LIPASE 13   No results for input(s): AMMONIA in the last 168 hours. Coagulation Profile: No results for input(s): INR, PROTIME in the last 168 hours. Cardiac Enzymes: No results for input(s): CKTOTAL, CKMB, CKMBINDEX, TROPONINI in the last 168 hours. BNP (last 3 results) No results for input(s): PROBNP in the last 8760 hours. HbA1C: No results for input(s): HGBA1C in the last 72 hours. CBG: Recent Labs  Lab 03/27/24 0733 03/27/24 1151 03/27/24 1702 03/27/24 2004 03/28/24 0727  GLUCAP 71 94 118* 113* 70   Lipid Profile: No results for input(s): CHOL, HDL, LDLCALC, TRIG, CHOLHDL, LDLDIRECT in the last 72 hours. Thyroid  Function  Tests: No results for input(s): TSH, T4TOTAL, FREET4, T3FREE, THYROIDAB in the last 72 hours. Anemia Panel: No results for input(s): VITAMINB12, FOLATE, FERRITIN, TIBC, IRON, RETICCTPCT in the last 72 hours. Sepsis Labs: Recent Labs  Lab 03/22/24 1523  LATICACIDVEN 0.8    Recent Results (from the past 240 hours)  Resp panel by RT-PCR (RSV, Flu A&B, Covid) Anterior Nasal Swab     Status: None   Collection Time:  03/22/24  1:42 PM   Specimen: Anterior Nasal Swab  Result Value Ref Range Status   SARS Coronavirus 2 by RT PCR NEGATIVE NEGATIVE Final    Comment: (NOTE) SARS-CoV-2 target nucleic acids are NOT DETECTED.  The SARS-CoV-2 RNA is generally detectable in upper respiratory specimens during the acute phase of infection. The lowest concentration of SARS-CoV-2 viral copies this assay can detect is 138 copies/mL. A negative result does not preclude SARS-Cov-2 infection and should not be used as the sole basis for treatment or other patient management decisions. A negative result may occur with  improper specimen collection/handling, submission of specimen other than nasopharyngeal swab, presence of viral mutation(s) within the areas targeted by this assay, and inadequate number of viral copies(<138 copies/mL). A negative result must be combined with clinical observations, patient history, and epidemiological information. The expected result is Negative.  Fact Sheet for Patients:  bloggercourse.com  Fact Sheet for Healthcare Providers:  seriousbroker.it  This test is no t yet approved or cleared by the [place]  FDA and  has been authorized for detection and/or diagnosis of SARS-CoV-2 by FDA under an Emergency Use Authorization (EUA). This EUA will remain  in effect (meaning this test can be used) for the duration of the COVID-19 declaration under Section 564(b)(1) of the Act, 21 U.S.C.section  360bbb-3(b)(1), unless the authorization is terminated  or revoked sooner.       Influenza A by PCR NEGATIVE NEGATIVE Final   Influenza B by PCR NEGATIVE NEGATIVE Final    Comment: (NOTE) The Xpert Xpress SARS-CoV-2/FLU/RSV plus assay is intended as an aid in the diagnosis of influenza from Nasopharyngeal swab specimens and should not be used as a sole basis for treatment. Nasal washings and aspirates are unacceptable for Xpert Xpress SARS-CoV-2/FLU/RSV testing.  Fact Sheet for Patients: bloggercourse.com  Fact Sheet for Healthcare Providers: seriousbroker.it  This test is not yet approved or cleared by the [place]  FDA and has been authorized for detection and/or diagnosis of SARS-CoV-2 by FDA under an Emergency Use Authorization (EUA). This EUA will remain in effect (meaning this test can be used) for the duration of the COVID-19 declaration under Section 564(b)(1) of the Act, 21 U.S.C. section 360bbb-3(b)(1), unless the authorization is terminated or revoked.     Resp Syncytial Virus by PCR NEGATIVE NEGATIVE Final    Comment: (NOTE) Fact Sheet for Patients: bloggercourse.com  Fact Sheet for Healthcare Providers: seriousbroker.it  This test is not yet approved or cleared by the [place]  FDA and has been authorized for detection and/or diagnosis of SARS-CoV-2 by FDA under an Emergency Use Authorization (EUA). This EUA will remain in effect (meaning this test can be used) for the duration of the COVID-19 declaration under Section 564(b)(1) of the Act, 21 U.S.C. section 360bbb-3(b)(1), unless the authorization is terminated or revoked.  Performed at [facility], [address] [place], [place] [address]     Radiology Studies: No results found.   Scheduled Meds:  sodium chloride    Intravenous Once   acidophilus  2 capsule Oral TID    docusate sodium   100 mg Oral Daily   DULoxetine   20 mg Oral QHS   folic acid   1 mg Oral Daily   Gerhardt's butt cream  1 Application Topical See admin instructions   levofloxacin  500 mg Oral Daily   pantoprazole   40 mg Oral BID AC   predniSONE   40 mg Oral Q breakfast   tamsulosin   0.4 mg Oral QPC supper  valACYclovir   1,000 mg Oral BID   Continuous Infusions:   LOS: 6 days   Almarie KANDICE Hoots, MD  03/28/2024, 9:36 AM

## 2024-03-28 NOTE — Evaluation (Signed)
 Physical Therapy Evaluation Patient Details Name: Sherri [name] MRN: [id number] DOB: [birth year] Today's Date: 03/28/2024  History of Present Illness  83 y.o. female PMH: rheumatoid arthritis, MS, hyperlipidemia, multiple orthopedic surgeries including right hip arthroplasty who has undergone surgical revision of her right hip arthroplasty, has chronic osteomyelitis and is now being admitted to the hospital with recurrent rash concerning for drug reaction.  Clinical Impression  Pt admitted with above diagnosis.  Pt motivated to work with PT. Assisted with short distance ambulance in room, min-mod assist to amb 5' bed to chair. Pt feeling shaky d/t low BS, RN giving supplement  Recommend pt resume HHPT  Pt currently with functional limitations due to the deficits listed below (see PT Problem List). Pt will benefit from acute skilled PT to increase their independence and safety with mobility to allow discharge.           If plan is discharge home, recommend the following: A little help with walking and/or transfers;A little help with bathing/dressing/bathroom;Assist for transportation;Help with stairs or ramp for entrance   Can travel by private vehicle        Equipment Recommendations None recommended by PT  Recommendations for Other Services       Functional Status Assessment Patient has had a recent decline in their functional status and demonstrates the ability to make significant improvements in function in a reasonable and predictable amount of time.     Precautions / Restrictions Precautions Precautions: Posterior Hip Restrictions Weight Bearing Restrictions Per Provider Order: No Other Position/Activity Restrictions: WBAT      Mobility  Bed Mobility Overal bed mobility: Needs Assistance Bed Mobility: Supine to Sit     Supine to sit: Contact guard     General bed mobility comments: for safety    Transfers Overall transfer level: Needs assistance Equipment  used: Rolling walker (2 wheels) Transfers: Sit to/from Stand Sit to Stand: Min assist           General transfer comment: assist to rise and steady    Ambulation/Gait Ambulation/Gait assistance: Min assist, Mod assist Gait Distance (Feet): 5 Feet Assistive device: Rolling walker (2 wheels) Gait Pattern/deviations: Step-to pattern       General Gait Details: assist to balance throughout distance, LOB x1 while turning to chair needign mod assist to recover  Stairs            Wheelchair Mobility     Tilt Bed    Modified Rankin (Stroke Patients Only)       Balance Overall balance assessment: Needs assistance Sitting-balance support: Feet supported, No upper extremity supported Sitting balance-Leahy Scale: Fair     Standing balance support: During functional activity, Reliant on assistive device for balance Standing balance-Leahy Scale: Poor Standing balance comment: bil UEs and external support                             Pertinent Vitals/Pain Pain Assessment Pain Assessment: No/denies pain    Home Living Family/patient expects to be discharged to:: (P) Private residence Living Arrangements: (P) Spouse/significant other Available Help at Discharge: (P) Family;Available PRN/intermittently;Available 24 hours/day;Personal care attendant Type of Home: (P) House Home Access: (P) Ramped entrance       Home Layout: (P) Able to live on main level with bedroom/bathroom Home Equipment: (P) Rollator (4 wheels);BSC/3in1;Shower seat - built in;Grab bars - tub/shower;Wheelchair - Forensic Psychologist (2 wheels);Other (comment) Additional Comments: (P) Per dtr, 24/7 caregivers hired or family.  Dtr is retired Engineer, Building [employer]                       Extremity/Trunk Assessment   Upper Extremity Assessment Upper Extremity Assessment: Defer to OT evaluation    Lower Extremity Assessment Lower Extremity Assessment: Generalized weakness;RLE  deficits/detail RLE Deficits / Details: hip abduction brace, RLE grossly 3/5 (baseline weaker on R per pt) RLE: Unable to fully assess due to immobilization       Communication   Communication Communication: Impaired Factors Affecting Communication: Hearing impaired    Cognition Arousal: Alert Behavior During Therapy: WFL for tasks assessed/performed                             Following commands: Intact       Cueing Cueing Techniques: Verbal cues     General Comments      Exercises     Assessment/Plan    PT Assessment Patient needs continued PT services  PT Problem List Decreased strength;Decreased activity tolerance;Decreased mobility;Decreased knowledge of use of DME;Decreased balance       PT Treatment Interventions DME instruction;Therapeutic exercise;Gait training;Functional mobility training;Therapeutic activities;Patient/family education    PT Goals (Current goals can be found in the Care Plan section)  Acute Rehab PT Goals Patient Stated Goal: home soon PT Goal Formulation: With patient Time For Goal Achievement: 04/11/24 Potential to Achieve Goals: Good    Frequency Min 3X/week     Co-evaluation               AM-PAC PT 6 Clicks Mobility  Outcome Measure Help needed turning from your back to your side while in a flat bed without using bedrails?: A Little Help needed moving from lying on your back to sitting on the side of a flat bed without using bedrails?: A Little Help needed moving to and from a bed to a chair (including a wheelchair)?: A Little Help needed standing up from a chair using your arms (e.g., wheelchair or bedside chair)?: A Little Help needed to walk in hospital room?: A Lot Help needed climbing 3-5 steps with a railing? : A Lot 6 Click Score: 16    End of Session Equipment Utilized During Treatment: Gait belt Activity Tolerance: Patient tolerated treatment well Patient left: in chair;with call bell/phone  within reach;with family/visitor present Nurse Communication: Mobility status PT Visit Diagnosis: Other abnormalities of gait and mobility (R26.89)    Time: 8872-8855 PT Time Calculation (min) (ACUTE ONLY): 17 min   Charges:   PT Evaluation $PT Eval Low Complexity: 1 Low   PT General Charges $$ ACUTE PT VISIT: 1 Visit         Jawon Dipiero, PT  Acute Rehab Dept [facility]) [phone]  03/28/2024   [facility] 03/28/2024, 12:11 PM

## 2024-03-28 NOTE — Progress Notes (Signed)
 Hypoglycemic Event  CBG: 65  Treatment: D50 25 mL (12.5 gm)  Symptoms: Shaky  Follow-up CBG: Time: 1204 CBG Result:124  Possible Reasons for Event: Inadequate meal intake  Comments/MD notified: provider aware    Sherri [name], Sherri [name]

## 2024-03-28 NOTE — Progress Notes (Signed)
 Patient has reported feeling full. She has refused meal supplement drinks x2.

## 2024-03-29 ENCOUNTER — Ambulatory Visit: Admitting: Rheumatology

## 2024-03-29 DIAGNOSIS — S62101D Fracture of unspecified carpal bone, right wrist, subsequent encounter for fracture with routine healing: Secondary | ICD-10-CM

## 2024-03-29 DIAGNOSIS — Z8659 Personal history of other mental and behavioral disorders: Secondary | ICD-10-CM

## 2024-03-29 DIAGNOSIS — M51369 Other intervertebral disc degeneration, lumbar region without mention of lumbar back pain or lower extremity pain: Secondary | ICD-10-CM

## 2024-03-29 DIAGNOSIS — S62102D Fracture of unspecified carpal bone, left wrist, subsequent encounter for fracture with routine healing: Secondary | ICD-10-CM

## 2024-03-29 DIAGNOSIS — G35D Multiple sclerosis, unspecified: Secondary | ICD-10-CM

## 2024-03-29 DIAGNOSIS — G8929 Other chronic pain: Secondary | ICD-10-CM

## 2024-03-29 DIAGNOSIS — E559 Vitamin D deficiency, unspecified: Secondary | ICD-10-CM

## 2024-03-29 DIAGNOSIS — G2581 Restless legs syndrome: Secondary | ICD-10-CM

## 2024-03-29 DIAGNOSIS — M67431 Ganglion, right wrist: Secondary | ICD-10-CM

## 2024-03-29 DIAGNOSIS — D61818 Other pancytopenia: Secondary | ICD-10-CM | POA: Diagnosis not present

## 2024-03-29 DIAGNOSIS — Z8639 Personal history of other endocrine, nutritional and metabolic disease: Secondary | ICD-10-CM

## 2024-03-29 DIAGNOSIS — Z8679 Personal history of other diseases of the circulatory system: Secondary | ICD-10-CM

## 2024-03-29 DIAGNOSIS — F5101 Primary insomnia: Secondary | ICD-10-CM

## 2024-03-29 DIAGNOSIS — M1611 Unilateral primary osteoarthritis, right hip: Secondary | ICD-10-CM

## 2024-03-29 DIAGNOSIS — M0579 Rheumatoid arthritis with rheumatoid factor of multiple sites without organ or systems involvement: Secondary | ICD-10-CM

## 2024-03-29 DIAGNOSIS — M810 Age-related osteoporosis without current pathological fracture: Secondary | ICD-10-CM

## 2024-03-29 DIAGNOSIS — Z79899 Other long term (current) drug therapy: Secondary | ICD-10-CM

## 2024-03-29 DIAGNOSIS — R5383 Other fatigue: Secondary | ICD-10-CM

## 2024-03-29 DIAGNOSIS — K219 Gastro-esophageal reflux disease without esophagitis: Secondary | ICD-10-CM

## 2024-03-29 DIAGNOSIS — M4312 Spondylolisthesis, cervical region: Secondary | ICD-10-CM

## 2024-03-29 DIAGNOSIS — Z7952 Long term (current) use of systemic steroids: Secondary | ICD-10-CM

## 2024-03-29 DIAGNOSIS — Z96642 Presence of left artificial hip joint: Secondary | ICD-10-CM

## 2024-03-29 DIAGNOSIS — M79641 Pain in right hand: Secondary | ICD-10-CM

## 2024-03-29 LAB — COMPREHENSIVE METABOLIC PANEL WITH GFR
ALT: 8 U/L (ref 0–44)
AST: 40 U/L (ref 15–41)
Albumin: 2.5 g/dL — ABNORMAL LOW (ref 3.5–5.0)
Alkaline Phosphatase: 70 U/L (ref 38–126)
Anion gap: 7 (ref 5–15)
BUN: <5 mg/dL — ABNORMAL LOW (ref 8–23)
CO2: 24 mmol/L (ref 22–32)
Calcium: 7.7 mg/dL — ABNORMAL LOW (ref 8.9–10.3)
Chloride: 104 mmol/L (ref 98–111)
Creatinine, Ser: 0.37 mg/dL — ABNORMAL LOW (ref 0.44–1.00)
GFR, Estimated: >60 mL/min (ref >60)
Glucose, Bld: 80 mg/dL (ref 70–99)
Potassium: 3.8 mmol/L (ref 3.5–5.1)
Sodium: 135 mmol/L (ref 135–145)
Total Bilirubin: 0.4 mg/dL (ref 0.0–1.2)
Total Protein: 5 g/dL — ABNORMAL LOW (ref 6.5–8.1)

## 2024-03-29 LAB — CBC
HCT: 28.6 % — ABNORMAL LOW (ref 36.0–46.0)
Hemoglobin: 9.2 g/dL — ABNORMAL LOW (ref 12.0–15.0)
MCH: 30 pg (ref 26.0–34.0)
MCHC: 32.2 g/dL (ref 30.0–36.0)
MCV: 93.2 fL (ref 80.0–100.0)
Platelets: 274 K/uL (ref 150–400)
RBC: 3.07 MIL/uL — ABNORMAL LOW (ref 3.87–5.11)
RDW: 19.9 % — ABNORMAL HIGH (ref 11.5–15.5)
WBC: 2.3 K/uL — ABNORMAL LOW (ref 4.0–10.5)
nRBC: 0 % (ref 0.0–0.2)

## 2024-03-29 LAB — PHOSPHORUS: Phosphorus: 3 mg/dL (ref 2.5–4.6)

## 2024-03-29 LAB — GLUCOSE, CAPILLARY
Glucose-Capillary: 59 mg/dL — ABNORMAL LOW (ref 70–99)
Glucose-Capillary: 65 mg/dL — ABNORMAL LOW (ref 70–99)
Glucose-Capillary: 75 mg/dL (ref 70–99)
Glucose-Capillary: 70 mg/dL (ref 70–99)

## 2024-03-29 MED ORDER — ONDANSETRON HCL 4 MG PO TABS: 4.0000 mg | ORAL_TABLET | Freq: Four times a day (QID) | ORAL | 0 refills | Status: AC | PRN

## 2024-03-29 MED ORDER — VALACYCLOVIR HCL 1 G PO TABS: 1000.0000 mg | ORAL_TABLET | Freq: Two times a day (BID) | ORAL | 0 refills | Status: DC

## 2024-03-29 MED ORDER — PREDNISONE 10 MG PO TABS: ORAL_TABLET | ORAL | 0 refills | Status: DC

## 2024-03-29 MED ORDER — PHENOL 1.4 % MT LIQD: 1.0000 | OROMUCOSAL | 0 refills | Status: AC | PRN

## 2024-03-29 MED ORDER — CHLORHEXIDINE GLUCONATE 0.12 % MT SOLN: 15.0000 mL | Freq: Four times a day (QID) | OROMUCOSAL | 0 refills | Status: AC

## 2024-03-29 MED ORDER — LIDOCAINE VISCOUS HCL 2 % MT SOLN: 15.0000 mL | OROMUCOSAL | 0 refills | Status: AC | PRN

## 2024-03-29 MED ORDER — LEVOFLOXACIN 500 MG PO TABS: 500.0000 mg | ORAL_TABLET | Freq: Every day | ORAL | 2 refills | Status: DC

## 2024-03-29 NOTE — TOC Transition Note (Signed)
 Transition of Care [facility]) - Discharge Note   Patient Details  Name: Sherri [name]  AJLA MCGEACHY MRN: [id number] Date of Birth: 03/05/1941  [facility]) CM/SW Contact:  Tawni CHRISTELLA Eva, LCSW Phone Number: 03/29/2024, 11:04 AM   Clinical Narrative:     Pt to d/c home with home health Speech through [facility], agency contact was added to pt's AVS. Care management sign off.     Barriers to Discharge: Barriers Resolved   Patient Goals and CMS Choice Patient states their goals for this hospitalization and ongoing recovery are:: retrun home CMS Medicare.gov Compare Post Acute Care list provided to:: Patient Represenative (must comment) Choice offered to / list presented to : Adult Children      Discharge Placement                    Patient and family notified of of transfer: 03/29/24  Discharge Plan and Services Additional resources added to the After Visit Summary for                                       Social Drivers of Health (SDOH) Interventions SDOH Screenings   Food Insecurity: No Food Insecurity (03/23/2024)  Housing: Low Risk  (03/23/2024)  Transportation Needs: No Transportation Needs (03/23/2024)  Utilities: Not At Risk (03/23/2024)  Alcohol  Screen: Low Risk  (02/21/2022)  Depression (PHQ2-9): Low Risk  (07/27/2023)  Financial Resource Strain: Low Risk  (02/21/2022)  Physical Activity: Inactive (02/21/2022)  Social Connections: Socially Integrated (03/23/2024)  Stress: Stress Concern Present (02/21/2022)  Tobacco Use: Medium Risk (03/23/2024)     Readmission Risk Interventions     No data to display

## 2024-03-29 NOTE — Progress Notes (Signed)
 Hypoglycemic Event  CBG: 65  Treatment: 4 oz juice/soda  Symptoms: None  Follow-up CBG: Upfz:9156 CBG Result:75  Possible Reasons for Event: Inadequate meal intake  Comments/MD notified:hypoglycemic protocol     Sherri [name]

## 2024-03-29 NOTE — Care Management Important Message (Signed)
 Important Message  Patient Details IM Letter given. Name: Sherri  GENEA [name] MRN: [id number] Date of Birth: 10/31/1940   Important Message Given:  Yes - Medicare IM     [name] 03/29/2024, 11:02 AM

## 2024-03-29 NOTE — Progress Notes (Signed)
 Hypoglycemic Event  CBG: 59  Treatment: 4 oz juice/soda  Symptoms: None  Follow-up CBG: Time:0805 CBG Result:65  Possible Reasons for Event: Inadequate meal intake  Comments/MD notified:hypoglycemic protocol     Sherri [name]

## 2024-03-30 ENCOUNTER — Telehealth: Payer: Self-pay

## 2024-03-30 NOTE — Transitions of Care (Post Inpatient/ED Visit) (Signed)
 03/30/2024  Name: Sherri [name]  KHALIAH BARNICK MRN: [id number] DOB: Aug 23, 1940  Today's TOC FU Call Status: Today's TOC FU Call Status:: Successful TOC FU Call Completed TOC FU Call Complete Date: 03/30/24 Patient's Name and Date of Birth confirmed.  Transition Care Management Follow-up Telephone Call Date of Discharge: 03/29/24 Discharge Facility: [facility]) Type of Discharge: Inpatient Admission Primary Inpatient Discharge Diagnosis:: Pancytopenia How have you been since you were released from the hospital?: Better (states that she continues to have difficulty swallowing.) Any questions or concerns?: No  Items Reviewed: Did you receive and understand the discharge instructions provided?: Yes Medications obtained,verified, and reconciled?: Yes (Medications Reviewed) Any new allergies since your discharge?: No Dietary orders reviewed?: Yes Type of Diet Ordered:: low salt heart healthy diet.   increase protein. Do you have support at home?: Yes People in Home [RPT]: child(ren), adult Name of Support/Comfort Primary Source: daughterGLENWOOD Cramp  Medications Reviewed Today: Medications Reviewed Today     Reviewed by Rumalda Alan PENNER, RN (Registered Nurse) on 03/30/24 at 1604  Med List Status: <None>   Medication Order Taking? Sig Documenting Provider Last Dose Status Informant  acetaminophen  (TYLENOL ) 325 MG tablet [id number] Yes Take 2 tablets (650 mg total) by mouth every 6 (six) hours as needed for mild pain (pain score 1-3) or fever (or Fever >/= 101). Dorinda Drue DASEN, MD  Active Family Member  ascorbic acid  (VITAMIN C ) 1000 MG tablet [id number] Yes Take 1 tablet (1,000 mg total) by mouth daily. Jerilynn Daphne SAILOR, NP  Active Family Member  Biotin  5000 MCG TABS [id number] Yes Take 10,000 mcg by mouth in the morning. [provider]  Active Family Member           Med Note STEPHEN, OLIVIA Latch Apr 10, 2017  2:56 PM)    Carboxymethylcellul-Glycerin  (LUBRICATING EYE DROPS OP) [id number]  Yes Place 1 drop into both eyes 3 (three) times daily as needed (dry eyes). [provider]  Active Family Member  chlorhexidine  (PERIDEX ) 0.12 % solution [id number] Yes Use as directed 15 mLs in the mouth or throat 4 (four) times daily. Will Almarie MATSU, MD  Active   Cholecalciferol  (VITAMIN D3) 1000 units CAPS [id number] Yes Take 1,000 Units by mouth daily. [provider]  Active Family Member  cyclobenzaprine  (FLEXERIL ) 10 MG tablet [id number] Yes TAKE 1 TABLET BY MOUTH THREE TIMES A DAY AS NEEDED FOR MUSCLE SPASM  Patient taking differently: Take 10 mg by mouth 3 (three) times daily as needed for muscle spasms.   [name] PARAS, MD  Active Family Member  denosumab  (PROLIA ) 60 MG/ML SOSY injection [id number] Yes Inject 60 mg into the skin every 6 (six) months. Deliver to rheum: [address], [place], [place] [address]. Appt on 07/23/2022 Cheryl Waddell HERO, PA-C  Active Family Member           Med Note EFRAIM, ALFREIDA CROME   Fri Mar 11, 2024  2:50 PM)    diazepam  (VALIUM ) 5 MG tablet [id number] Yes TAKE 1 TABLET BY MOUTH AT BEDTIME AS NEEDED FOR MUSCLE SPASMS.  Patient taking differently: Take 5 mg by mouth at bedtime as needed (for muscle spasms).   [name] PARAS, MD  Active Family Member  DULoxetine  (CYMBALTA ) 20 MG capsule [id number] Yes TAKE 1 CAPSULE BY MOUTH AT BEDTIME. [name] PARAS, MD  Active Family Member  folic acid  (FOLVITE ) 1 MG tablet [id number] Yes Take 1 tablet (1 mg total) by mouth daily. Cheryl Waddell HERO, PA-C  Active Family Member  furosemide  (LASIX ) 20 MG tablet [id number] Yes Take 0.5 tablets (10 mg total) by mouth daily.  Patient taking differently: Take 10 mg by mouth daily as needed (for swelling of the legs).   Jerilynn Daphne SAILOR, NP  Active Family Member  Lactobacillus (ACIDOPHILUS) CAPS capsule [id number] Yes Take 2 capsules by mouth 3 (three) times daily. Arlon Carliss ORN, DO  Active Family Member  levofloxacin (LEVAQUIN) 500 MG tablet [id number] Yes  Take 1 tablet (500 mg total) by mouth daily. Will Almarie MATSU, MD  Active   lidocaine  (XYLOCAINE ) 2 % solution [id number] Yes Use as directed 15 mLs in the mouth or throat every 3 (three) hours as needed for mouth pain. Will Almarie MATSU, MD  Active   magic mouthwash (nystatin , lidocaine , diphenhydrAMINE , alum & mag hydroxide) suspension [id number] Yes Swish and spit 5 mLs 3 (three) times daily as needed for mouth pain. [name] PARAS, MD  Active Family Member  Nystatin  (GERHARDT'S BUTT CREAM) CREA [id number] Yes Apply 1 Application topically 3 (three) times daily.  Patient taking differently: Apply 1 Application topically See admin instructions. Apply as directed to buttocks after every cleansing/drying   [name] PARAS, MD  Active Family Member  ondansetron  (ZOFRAN ) 4 MG tablet [id number] Yes Take 1 tablet (4 mg total) by mouth every 6 (six) hours as needed for nausea. Will Almarie MATSU, MD  Active   pantoprazole  (PROTONIX ) 40 MG tablet [id number] Yes Take 1 tablet (40 mg total) by mouth 2 (two) times daily before a meal. Burns, Glade PARAS, MD  Active Family Member  phenol [facility]) 1.4 % LIQD [id number] Yes Use as directed 1 spray in the mouth or throat as needed for throat irritation / pain. Will Almarie MATSU, MD  Active   polyethylene glycol powder (GLYCOLAX /MIRALAX ) 17 GM/SCOOP powder [id number] Yes Take 17 g by mouth daily.  Patient taking differently: Take 17 g by mouth See admin instructions. Mix 17 grams into a cup of coffee and drink in the morning   Arlon Carliss ORN, DO  Active Family Member  predniSONE  (DELTASONE ) 10 MG tablet [id number] Yes Take 20 mg daily for 4 days and then 10 mg daily for 4 days.  Once this is done and continue her home dose of 5 mg daily. Will Almarie MATSU, MD  Active   tamsulosin  (FLOMAX ) 0.4 MG CAPS capsule [id number] Yes TAKE 1 CAPSULE BY MOUTH EVERY DAY AFTER SUPPER  Patient taking differently: Take 0.4 mg by mouth daily after supper.   [name] PARAS,  MD  Active Family Member  valACYclovir  (VALTREX ) 1000 MG tablet [id number] Yes Take 1 tablet (1,000 mg total) by mouth 2 (two) times daily. Will Almarie MATSU, MD  Active   Zinc  Sulfate 220 (50 Zn) MG TABS [id number] Yes Take 1 tablet (220 mg total) by mouth daily with supper. Jerilynn Daphne SAILOR, NP  Active Family Member  Med List Note Steffi Nian, CPhT 01/19/24 9073): Daughter is able to help with medications.            Today's Vitals   03/30/24 1617  PainSc: 10-Worst pain ever     Home Care and Equipment/Supplies: Were Home Health Services Ordered?: Yes Name of Home Health Agency:: [facility] Has Agency set up a time to come to your home?: Yes First Home Health Visit Date: 03/31/24 Any new equipment or medical supplies ordered?: No  Functional Questionnaire: Do you need assistance with bathing/showering or dressing?: Yes (has a caregiver 24/7) Do you need assistance  with meal preparation?: Yes (has a caregiver 24/7) Do you need assistance with eating?: No Do you have difficulty maintaining continence: Yes Do you need assistance with getting out of bed/getting out of a chair/moving?: Yes (total assistance) Do you have difficulty managing or taking your medications?: Yes (daughter)  Follow up appointments reviewed: PCP Follow-up appointment confirmed?: Yes Date of PCP follow-up appointment?: 04/05/24 Follow[facility] Provider[facility] Follow-up appointment confirmed?: Yes Date of Specialist follow-up appointment?: 04/07/24 Follow-Up Specialty Provider:: [facility] Do you need transportation to your follow-up appointment?: No Do you understand care options if your condition(s) worsen?: Yes-patient verbalized understanding  SDOH Interventions Today    Flowsheet Row Most Recent Value  SDOH Interventions   Food Insecurity Interventions Intervention Not Indicated  Housing Interventions Intervention Not Indicated  Transportation Interventions Intervention Not  Indicated  Utilities Interventions Intervention Not Indicated  Depression Interventions/Treatment  Medication    Spoke with patient who wanted me to talk to daughter, Luke.  Luke states that patient is doing about the same.  Daughter is a engineer, civil (consulting).  Daughter reports patient complains of buttock pain the most.  Daughter manages medications. Patient has caregivers 24/7 per daughter.  Patient is having increased depression and anxiety.  Discussed with daughter who states that she will talk to patient to determine interest in therapy.  Patient likes to talk to he pastor.   Reviewed wound care. Reviewed importance of nutrition and protein with wounds present. Encouraged daughter to ensure that patient is being repositioned often.  Reviewed importance of timely follow up and ensured that patient has transportation.  Reviewed importance of swallowing and aspiration precautions to include: chewing food well, not talking during meals and chewing, Paying attention to swallowing.Being upright and staying upright for at least 30 minutes after eating.  Reviewed use of popsicles to help with mouth pain.  Discussed using oral suctioning gently due to mouth pain.   Reviewed and offered 30 day TOC program and daughter declined. Provided my contact information.   Alan Ee, RN, BSN, CEN [employer]- Transition of Care Team.  [facility] [phone]

## 2024-03-31 ENCOUNTER — Telehealth: Payer: Self-pay

## 2024-03-31 ENCOUNTER — Encounter: Payer: Self-pay | Admitting: Pharmacist

## 2024-03-31 NOTE — Progress Notes (Signed)
 Pharmacy Quality Measure Review  This patient is appearing on a report for being at risk of failing the adherence measure for cholesterol (statin) medications this calendar year.   Medication: Atorvastatin  Last fill date: 12/22/23 for 90 day supply  Per chart review, pt w/ recent hospitalization. Statin was discontinued during this hospital admission. No further action needed.  Darrelyn Drum, PharmD, BCPS, CPP Clinical Pharmacist Practitioner [facility] [phone]

## 2024-03-31 NOTE — Telephone Encounter (Signed)
 Copied from CRM #[id number]. Topic: General - Other >> Mar 31, 2024  2:57 PM [name] wrote: Reason for CRM: judy from [facility] called stating the patient is having trouble with swallowing her medication and she has three that she can't get down and they are valtrex , tylenol  and vitamin C . The patient is getting speech therapy and Dagoberto need an evaluation for nursing for wound care because the patient has a lot of skin tears and a pressure ulcer on her bottom [phone]

## 2024-04-01 NOTE — Telephone Encounter (Signed)
 Can switch to a chewable vitamin C .  Can switch to liquid Tylenol  or chewable Tylenol .  The Valtrex  looks like it is short-term-not a long-term medication.

## 2024-04-01 NOTE — Discharge Summary (Signed)
 Physician Discharge Summary  Sherri [name]  CACI ORREN FMW:[id number] DOB: Jul 19, 1940 DOA: 03/22/2024  PCP: [name] PARAS, MD  Admit date: 03/22/2024 Discharge date: 03/29/2024 Admitted From:  Disposition: home Recommendations for Outpatient Follow-up:  Follow up with PCP in 1-2 weeks Please obtain BMP/CBC in one week  Home Health:yes Equipment/Devices:none Discharge Condition:stable CODE STATUS:full Diet recommendation: cardiac Brief/Interim Summary:83 y.o. female with medical history significant for rheumatoid arthritis, MS, hyperlipidemia, multiple orthopedic surgeries including right hip arthroplasty who has undergone surgical revision of her right hip arthroplasty, has chronic osteomyelitis and is now being admitted to the hospital with recurrent rash concerning for drug reaction.  She was discharged from [facility] on 10/18 after being admitted for itchy rash on her extremities.  Her primary caregiver is at the bedside and assists significantly with providing the history.  It seems that the patient was on IV cefepime  via PICC line several weeks in the past, this was transitioned to p.o. ciprofloxacin .  She developed itchy rash over her entire body similar to the one she has now, when she started the ciprofloxacin  and was admitted to [facility].  While in the hospital, it seems the p.o. ciprofloxacin  was held and the rash completely resolved.  She resumed the ciprofloxacin  a couple weeks ago when she left the hospital, and the rash that we see now recurred.  On evaluation today in the ER, she also has pancytopenia.  1 unit PRBC has been ordered.    Discharge Diagnoses:  Principal Problem:   Pancytopenia (HCC)   Principal Problem:   Pancytopenia (HCC) Hypoglycemia due to poor p.o. intake from oral ulcers  patient was treated with D5. CBG (last 3)  Recent Labs (last 2 labs)       Recent Labs    03/27/24 1702 03/27/24 2004 03/28/24 0727  GLUCAP 118* 113* 70      Continue Ensure 3 times daily  with chlorhexidine  mouthwash   Rash -- unclear etiology, but given this is 2nd time rash after taking ciprofloxacin , drug reaction highly suspected.  Rash may also be related to autoimmune disease (pemphigus vulgaris) but time course would definitely favor ciprofloxacin  reaction. 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  On my review chest x-ray appears similar than the one on 10/28.  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  Avoid Cipro  --Appreciate ID input --ID recommends levofloxacin 3 to 6 months   Status post right total hip arthroplasty --Consult orthopedic for any specific activity restrictions   Acute anemia superimposed on anemia of chronic disease, no evidence of blood loss.Transfused 1 unit PRBC for initial Hbg 6.4 Hbg  8.7. Hemoglobin has remained stable since.  Pancytopenia-unclear etiology, potentially related to drug reaction, autoimmune condition related to her RA WBC normalized.  Platelets improving.  Hemoglobin stable after transfusions on admission Heme-Oncology consult recommends to  stop Methotrexate .   Elevated troponin- demand ischemia in setting of severe anemia.   Rheumatoid arthritis -- continue Methotrexate    Severe malnutrition  Due to chronic illnesses and deconditioning. As evidenced by severe fat and muscle depletion Maximize caloric & protein intake  Pressure injuries of skin - POA I agree with the wound description/s as outlined below   Wound 03/22/24 2327 Pressure Injury Buttocks Left Stage 3 -  Full thickness tissue loss. Subcutaneous fat may be visible but bone, tendon or muscle are NOT exposed. (Active)     Wound 03/22/24 2347 Pressure Injury Buttocks Left;Mid Stage 3 -  Full thickness tissue loss. Subcutaneous fat may be visible but bone, tendon or muscle are NOT exposed. (Active)    Estimated body mass index is 19.54 kg/m as calculated from the following:   Height as of this encounter: 5' 1 (1.549 m).   Weight as of this encounter: 46.9 kg.  Discharge Instructions  Discharge Instructions     Diet - low sodium heart healthy   Complete by: As directed    Discharge wound care:   Complete by: As directed    Cleanse bilateral upper and lower leg  skin tears with normal saline apply Vaseline gauze to bed wound beds daily and cover with silicone foam.  Soak the dressing in normal saline if adhered to wound bed for atraumatic removal.   Increase activity slowly   Complete by: As directed       Allergies as of 03/29/2024       Reactions   Ciprofloxacin  Itching, Nausea Only, Rash, Other (See Comments)   Blistering rash and oral ulcers occurred when retried Cipro  and severe mouth pain Made her sick- stomach hurt. (Also)    Darvon [propoxyphene Hcl] Shortness Of Breath, Other (See Comments)   ? Dose Related ? Lowered respirations greatly   Demerol [meperidine] Shortness Of Breath, Other (See Comments)   Respiratory Distress   Penicillins Hives, Other (See Comments)   Has patient had a PCN reaction causing immediate rash,  facial/tongue/throat swelling, SOB or lightheadedness with hypotension: No SEVERE RASH INVOLVING MUCUS MEMBRANES or SKIN NECROSIS: #  #  #  YES  #  #  #  Has patient had a PCN reaction that required hospitalization No Has patient had a PCN reaction occurring within the last 10 years: No.   Imuran [azathioprine] Other (See Comments)   Weakness   Sulfa  Antibiotics Nausea Only, Other (See Comments)   States had severe indigestion and heartburn and nausea and had to stop taking   Arava [leflunomide] Other (See Comments)   Excessive weight gain   Lipitor [atorvastatin ] Nausea And Vomiting   Currently taking 2 times weekly - able to tolerate low dose    Vytorin [ezetimibe-simvastatin] Nausea And Vomiting        Medication List     STOP taking these medications    atorvastatin  10 MG tablet Commonly known as: LIPITOR   bisacodyl  10 MG suppository Commonly known as: DULCOLAX   docusate sodium  100 MG capsule Commonly known as: COLACE   hydrOXYzine  10 MG tablet Commonly known as: ATARAX    methotrexate  2.5 MG tablet Commonly known as: RHEUMATREX   oxyCODONE  5 MG immediate release tablet Commonly known as: Roxicodone    potassium chloride  SA 20 MEQ tablet Commonly known as: KLOR-CON  M  [name]  15 MG Tb24 Generic drug: Upadacitinib  ER   sodium chloride  1 g tablet       TAKE these medications    acetaminophen  325 MG tablet Commonly known as: TYLENOL  Take 2 tablets (650 mg total) by mouth every 6 (six) hours as needed for mild pain (pain score 1-3) or fever (or Fever >/= 101).   Acidophilus Caps capsule Take 2 capsules by mouth 3 (three) times daily.   Biotin  5000 MCG Tabs Take 10,000 mcg by mouth in the morning.   chlorhexidine  0.12 % solution Commonly known as: PERIDEX  Use as directed 15 mLs in the mouth or throat 4 (four) times daily.   cyclobenzaprine  10 MG tablet Commonly known as: FLEXERIL  TAKE 1 TABLET BY MOUTH THREE TIMES A DAY AS NEEDED FOR MUSCLE  SPASM What changed: See the new instructions.   diazepam  5 MG tablet Commonly known as: VALIUM  TAKE 1 TABLET BY MOUTH AT BEDTIME AS NEEDED FOR MUSCLE SPASMS. What changed: See the new instructions.   DULoxetine  20 MG capsule Commonly known as: CYMBALTA  TAKE 1 CAPSULE BY MOUTH AT BEDTIME.   folic acid  1 MG tablet Commonly known as: FOLVITE  Take 1 tablet (1 mg total) by mouth daily.   furosemide  20 MG tablet Commonly known as: LASIX  Take 0.5 tablets (10 mg total) by mouth daily. What changed:  when to take this reasons to take this   Gerhardt's butt cream Crea Apply 1 Application topically 3 (three) times daily. What changed:  when to take this additional instructions   levofloxacin 500 MG tablet Commonly known as: LEVAQUIN Take 1 tablet (500 mg total) by mouth daily.   lidocaine  2 % solution Commonly known as: XYLOCAINE  Use as directed 15 mLs in the mouth or throat every 3 (three) hours as needed for mouth pain.   LUBRICATING EYE DROPS OP Place 1 drop into both eyes 3 (three) times daily as needed (dry eyes).   magic mouthwash (nystatin , lidocaine , diphenhydrAMINE , alum & mag hydroxide) suspension Swish and spit 5 mLs 3 (three) times daily as needed for mouth pain.   ondansetron  4 MG tablet Commonly known as: ZOFRAN  Take 1 tablet (4 mg total) by mouth every 6 (six) hours as needed for nausea.   pantoprazole  40 MG tablet Commonly known as: PROTONIX  Take 1 tablet (40 mg total) by mouth 2 (two) times daily before a meal.   phenol 1.4 % Liqd Commonly known as: CHLORASEPTIC Use as directed 1 spray in the mouth or throat as needed for throat irritation / pain.   polyethylene glycol powder 17 GM/SCOOP powder Commonly known as: GLYCOLAX /MIRALAX  Take 17 g by mouth daily. What changed:  when to take this additional instructions   predniSONE  10 MG tablet Commonly known as: DELTASONE  Take 20 mg daily for 4 days and then 10 mg daily for 4 days.  Once this is done and  continue her home dose of 5 mg daily. What changed:  medication strength See the new instructions.   Prolia  60 MG/ML Sosy injection Generic drug: denosumab  Inject 60 mg into the skin every 6 (six) months. Deliver to rheum: [address], [address], [place], [place] [address]. Appt on 07/23/2022   tamsulosin  0.4 MG Caps capsule Commonly known as: FLOMAX  TAKE 1 CAPSULE BY MOUTH EVERY DAY AFTER SUPPER What changed: See the new instructions.   valACYclovir  1000 MG tablet Commonly known as: VALTREX  Take 1 tablet (1,000 mg total) by mouth 2 (two) times daily.   vitamin C  1000 MG tablet Take  1 tablet (1,000 mg total) by mouth daily.   Vitamin D3 1000 units Caps Take 1,000 Units by mouth daily.   Zinc  Sulfate 220 (50 Zn) MG Tabs Take 1 tablet (220 mg total) by mouth daily with supper.               Discharge Care Instructions  (From admission, onward)           Start     Ordered   03/29/24 0000  Discharge wound care:       Comments: Cleanse bilateral upper and lower leg  skin tears with normal saline apply Vaseline gauze to bed wound beds daily and cover with silicone foam.  Soak the dressing in normal saline if adhered to wound bed for atraumatic removal.   03/29/24 1018            Contact information for follow-up providers     [name] PARAS, MD Follow up.   Specialty: Internal Medicine Contact information: [address] [place] [address] [phone]         Overton Constance DASEN, MD Follow up.   Specialty: Infectious Diseases Contact information: [address] [place] [address] [phone]         Dolphus Reiter, MD Follow up.   Specialty: Rheumatology Contact information: [address] [place] [address] [phone]              Contact information for after-discharge care     Home Medical Care     [facility] [place] [facility]) .   Service: Home Health Services Contact  information: [address] [place] [address] [place]  [address] [phone]                    Allergies  Allergen Reactions   Ciprofloxacin  Itching, Nausea Only, Rash and Other (See Comments)    Blistering rash and oral ulcers occurred when retried Cipro  and severe mouth pain Made her sick- stomach hurt. (Also)    Darvon [Propoxyphene Hcl] Shortness Of Breath and Other (See Comments)    ? Dose Related ? Lowered respirations greatly   Demerol [Meperidine] Shortness Of Breath and Other (See Comments)    Respiratory Distress   Penicillins Hives and Other (See Comments)    Has patient had a PCN reaction causing immediate rash, facial/tongue/throat swelling, SOB or lightheadedness with hypotension: No SEVERE RASH INVOLVING MUCUS MEMBRANES or SKIN NECROSIS: #  #  #  YES  #  #  #  Has patient had a PCN reaction that required hospitalization No Has patient had a PCN reaction occurring within the last 10 years: No.    Imuran [Azathioprine] Other (See Comments)    Weakness   Sulfa  Antibiotics Nausea Only and Other (See Comments)    States had severe indigestion and heartburn and nausea and had to stop taking   Arava [Leflunomide] Other (See Comments)    Excessive weight gain   Lipitor [Atorvastatin ] Nausea And Vomiting    Currently taking 2 times weekly - able to tolerate low dose    Vytorin [Ezetimibe-Simvastatin] Nausea And Vomiting    Consultations:ortho   Procedures/Studies: DG CHEST PORT 1 VIEW Result Date: 03/25/2024 CLINICAL DATA:  Several day history of productive cough EXAM: PORTABLE CHEST 1 VIEW COMPARISON:  Chest radiograph dated 03/22/2024 FINDINGS: Normal lung volumes. Subtle rounded opacity projects over the left mid lung. No pleural effusion or pneumothorax. The heart size and  mediastinal contours are within normal limits. Cervical spinal fixation hardware appears intact. IMPRESSION: Subtle rounded opacity projects over the left mid lung,  which may represent a focus of infection. Recommend follow-up chest radiograph in 6-8 weeks to ensure resolution. Electronically Signed   By: Limin  Xu M.D.   On: 03/25/2024 17:45   DG Chest 1 View Result Date: 03/22/2024 CLINICAL DATA:  Shortness of breath EXAM: CHEST  1 VIEW COMPARISON:  Chest x-ray performed Oct 02, 2023 FINDINGS: Heart mediastinum are not significantly changed. No focal infiltrate, pleural effusion, or pneumothorax. IMPRESSION: 1. No plain film evidence of lobar infiltrate. Electronically Signed   By: Maude Naegeli M.D.   On: 03/22/2024 13:52   (Echo, Carotid, EGD, Colonoscopy, ERCP)    Subjective: Anxious to go home   Discharge Exam: Vitals:   03/29/24 0549 03/29/24 0720  BP: (!) 174/84 (!) 166/95  Pulse: 96 (!) 103  Resp: 16   Temp: 97.6 F (36.4 C)   SpO2: 98%    Vitals:   03/28/24 1416 03/28/24 2043 03/29/24 0549 03/29/24 0720  BP: (!) 164/90 (!) 141/67 (!) 174/84 (!) 166/95  Pulse: (!) 110 98 96 (!) 103  Resp: 16 16 16    Temp: 98.2 F (36.8 C) 97.9 F (36.6 C) 97.6 F (36.4 C)   TempSrc: Oral Oral Oral   SpO2: 100% 98% 98%   Weight:      Height:        General: Pt is alert, awake, not in acute distress Cardiovascular: RRR, S1/S2 +, no rubs, no gallops Respiratory: CTA bilaterally, no wheezing, no rhonchi Abdominal: Soft, NT, ND, bowel sounds + Extremities: no edema, no cyanosis    The results of significant diagnostics from this hospitalization (including imaging, microbiology, ancillary and laboratory) are listed below for reference.     Microbiology: Recent Results (from the past 240 hours)  Resp panel by RT-PCR (RSV, Flu A&B, Covid) Anterior Nasal Swab     Status: None   Collection Time: 03/22/24  1:42 PM   Specimen: Anterior Nasal Swab  Result Value Ref Range Status   SARS Coronavirus 2 by RT PCR NEGATIVE NEGATIVE Final    Comment: (NOTE) SARS-CoV-2 target nucleic acids are NOT DETECTED.  The SARS-CoV-2 RNA is generally detectable  in upper respiratory specimens during the acute phase of infection. The lowest concentration of SARS-CoV-2 viral copies this assay can detect is 138 copies/mL. A negative result does not preclude SARS-Cov-2 infection and should not be used as the sole basis for treatment or other patient management decisions. A negative result may occur with  improper specimen collection/handling, submission of specimen other than nasopharyngeal swab, presence of viral mutation(s) within the areas targeted by this assay, and inadequate number of viral copies(<138 copies/mL). A negative result must be combined with clinical observations, patient history, and epidemiological information. The expected result is Negative.  Fact Sheet for Patients:  bloggercourse.com  Fact Sheet for Healthcare Providers:  seriousbroker.it  This test is no t yet approved or cleared by the [place]  FDA and  has been authorized for detection and/or diagnosis of SARS-CoV-2 by FDA under an Emergency Use Authorization (EUA). This EUA will remain  in effect (meaning this test can be used) for the duration of the COVID-19 declaration under Section 564(b)(1) of the Act, 21 U.S.C.section 360bbb-3(b)(1), unless the authorization is terminated  or revoked sooner.       Influenza A by PCR NEGATIVE NEGATIVE Final   Influenza B by PCR NEGATIVE NEGATIVE Final  Comment: (NOTE) The Xpert Xpress SARS-CoV-2/FLU/RSV plus assay is intended as an aid in the diagnosis of influenza from Nasopharyngeal swab specimens and should not be used as a sole basis for treatment. Nasal washings and aspirates are unacceptable for Xpert Xpress SARS-CoV-2/FLU/RSV testing.  Fact Sheet for Patients: bloggercourse.com  Fact Sheet for Healthcare Providers: seriousbroker.it  This test is not yet approved or cleared by the [place]  FDA and has  been authorized for detection and/or diagnosis of SARS-CoV-2 by FDA under an Emergency Use Authorization (EUA). This EUA will remain in effect (meaning this test can be used) for the duration of the COVID-19 declaration under Section 564(b)(1) of the Act, 21 U.S.C. section 360bbb-3(b)(1), unless the authorization is terminated or revoked.     Resp Syncytial Virus by PCR NEGATIVE NEGATIVE Final    Comment: (NOTE) Fact Sheet for Patients: bloggercourse.com  Fact Sheet for Healthcare Providers: seriousbroker.it  This test is not yet approved or cleared by the [place]  FDA and has been authorized for detection and/or diagnosis of SARS-CoV-2 by FDA under an Emergency Use Authorization (EUA). This EUA will remain in effect (meaning this test can be used) for the duration of the COVID-19 declaration under Section 564(b)(1) of the Act, 21 U.S.C. section 360bbb-3(b)(1), unless the authorization is terminated or revoked.  Performed at [facility], [address] [place], [place] [address]      Labs: BNP (last 3 results) No results for input(s): BNP in the last 8760 hours. Basic Metabolic Panel: Recent Labs  Lab 03/26/24 0416 03/27/24 0357 03/28/24 0402 03/29/24 0431  NA 135 133* 134* 135  K 3.6 3.1* 4.2 3.8  CL 102 102 102 104  CO2 23 24 22 24   GLUCOSE 85 82 79 80  BUN 5* <5* <5* <5*  CREATININE 0.48 0.40* 0.42* 0.37*  CALCIUM  7.4* 7.1* 7.7* 7.7*  MG 2.0  --   --   --   PHOS 2.2* 2.4*  --  3.0   Liver Function Tests: Recent Labs  Lab 03/29/24 0431  AST 40  ALT 8  ALKPHOS 70  BILITOT 0.4  PROT 5.0*  ALBUMIN  2.5*   No results for input(s): LIPASE, AMYLASE in the last 168 hours. No results for input(s): AMMONIA in the last 168 hours. CBC: Recent Labs  Lab 03/26/24 0416 03/27/24 0357 03/28/24 0402 03/29/24 0431  WBC 4.2 3.1* 2.9* 2.3*  HGB 8.9* 8.7* 9.7* 9.2*  HCT 27.8* 27.4*  29.9* 28.6*  MCV 91.4 92.6 93.1 93.2  PLT 75* 119* 192 274   Cardiac Enzymes: No results for input(s): CKTOTAL, CKMB, CKMBINDEX, TROPONINI in the last 168 hours. BNP: Invalid input(s): POCBNP CBG: Recent Labs  Lab 03/28/24 2144 03/29/24 0727 03/29/24 0805 03/29/24 0843 03/29/24 1112  GLUCAP 147* 59* 65* 75 70   D-Dimer No results for input(s): DDIMER in the last 72 hours. Hgb A1c No results for input(s): HGBA1C in the last 72 hours. Lipid Profile No results for input(s): CHOL, HDL, LDLCALC, TRIG, CHOLHDL, LDLDIRECT in the last 72 hours. Thyroid  function studies No results for input(s): TSH, T4TOTAL, T3FREE, THYROIDAB in the last 72 hours.  Invalid input(s): FREET3 Anemia work up No results for input(s): VITAMINB12, FOLATE, FERRITIN, TIBC, IRON, RETICCTPCT in the last 72 hours. Urinalysis    Component Value Date/Time   COLORURINE YELLOW 03/22/2024 1751   APPEARANCEUR CLEAR 03/22/2024 1751   LABSPEC 1.008 03/22/2024 1751   PHURINE 8.0 03/22/2024 1751   GLUCOSEU NEGATIVE 03/22/2024 1751   GLUCOSEU NEGATIVE  04/10/2020 0914   HGBUR NEGATIVE 03/22/2024 1751   HGBUR trace-lysed 05/29/2010 0750   BILIRUBINUR NEGATIVE 03/22/2024 1751   KETONESUR 5 (A) 03/22/2024 1751   PROTEINUR NEGATIVE 03/22/2024 1751   UROBILINOGEN 0.2 04/10/2020 0914   NITRITE NEGATIVE 03/22/2024 1751   LEUKOCYTESUR NEGATIVE 03/22/2024 1751   Sepsis Labs Recent Labs  Lab 03/26/24 0416 03/27/24 0357 03/28/24 0402 03/29/24 0431  WBC 4.2 3.1* 2.9* 2.3*   Microbiology Recent Results (from the past 240 hours)  Resp panel by RT-PCR (RSV, Flu A&B, Covid) Anterior Nasal Swab     Status: None   Collection Time: 03/22/24  1:42 PM   Specimen: Anterior Nasal Swab  Result Value Ref Range Status   SARS Coronavirus 2 by RT PCR NEGATIVE NEGATIVE Final    Comment: (NOTE) SARS-CoV-2 target nucleic acids are NOT DETECTED.  The SARS-CoV-2 RNA is generally  detectable in upper respiratory specimens during the acute phase of infection. The lowest concentration of SARS-CoV-2 viral copies this assay can detect is 138 copies/mL. A negative result does not preclude SARS-Cov-2 infection and should not be used as the sole basis for treatment or other patient management decisions. A negative result may occur with  improper specimen collection/handling, submission of specimen other than nasopharyngeal swab, presence of viral mutation(s) within the areas targeted by this assay, and inadequate number of viral copies(<138 copies/mL). A negative result must be combined with clinical observations, patient history, and epidemiological information. The expected result is Negative.  Fact Sheet for Patients:  bloggercourse.com  Fact Sheet for Healthcare Providers:  seriousbroker.it  This test is no t yet approved or cleared by the [place]  FDA and  has been authorized for detection and/or diagnosis of SARS-CoV-2 by FDA under an Emergency Use Authorization (EUA). This EUA will remain  in effect (meaning this test can be used) for the duration of the COVID-19 declaration under Section 564(b)(1) of the Act, 21 U.S.C.section 360bbb-3(b)(1), unless the authorization is terminated  or revoked sooner.       Influenza A by PCR NEGATIVE NEGATIVE Final   Influenza B by PCR NEGATIVE NEGATIVE Final    Comment: (NOTE) The Xpert Xpress SARS-CoV-2/FLU/RSV plus assay is intended as an aid in the diagnosis of influenza from Nasopharyngeal swab specimens and should not be used as a sole basis for treatment. Nasal washings and aspirates are unacceptable for Xpert Xpress SARS-CoV-2/FLU/RSV testing.  Fact Sheet for Patients: bloggercourse.com  Fact Sheet for Healthcare Providers: seriousbroker.it  This test is not yet approved or cleared by the [place]  FDA  and has been authorized for detection and/or diagnosis of SARS-CoV-2 by FDA under an Emergency Use Authorization (EUA). This EUA will remain in effect (meaning this test can be used) for the duration of the COVID-19 declaration under Section 564(b)(1) of the Act, 21 U.S.C. section 360bbb-3(b)(1), unless the authorization is terminated or revoked.     Resp Syncytial Virus by PCR NEGATIVE NEGATIVE Final    Comment: (NOTE) Fact Sheet for Patients: bloggercourse.com  Fact Sheet for Healthcare Providers: seriousbroker.it  This test is not yet approved or cleared by the [place]  FDA and has been authorized for detection and/or diagnosis of SARS-CoV-2 by FDA under an Emergency Use Authorization (EUA). This EUA will remain in effect (meaning this test can be used) for the duration of the COVID-19 declaration under Section 564(b)(1) of the Act, 21 U.S.C. section 360bbb-3(b)(1), unless the authorization is terminated or revoked.  Performed at [facility], [address]  [place], [place] [address]      Time coordinating discharge: 39 min  SIGNED:  Almarie KANDICE Hoots, MD  [facility] 04/01/2024, 10:07 AM

## 2024-04-04 ENCOUNTER — Telehealth: Payer: Self-pay | Admitting: Internal Medicine

## 2024-04-04 NOTE — Telephone Encounter (Signed)
 Copied from CRM #[id number]. Topic: Clinical - Medical Advice >> Apr 04, 2024 11:21 AM Nessti S wrote: Reason for CRM: unable to swallow valACYclovir  (VALTREX ) 1000 MG tablet and would like another med in place, for wound care orders multiple skin tears want to use xeroform and foam dressing and for the stage 3 ulcer want to use medihoney and foam dressing.

## 2024-04-05 ENCOUNTER — Encounter: Payer: Self-pay | Admitting: Internal Medicine

## 2024-04-05 ENCOUNTER — Inpatient Hospital Stay: Admitting: Internal Medicine

## 2024-04-05 DIAGNOSIS — D649 Anemia, unspecified: Secondary | ICD-10-CM | POA: Insufficient documentation

## 2024-04-05 DIAGNOSIS — E43 Unspecified severe protein-calorie malnutrition: Secondary | ICD-10-CM | POA: Insufficient documentation

## 2024-04-05 NOTE — Telephone Encounter (Signed)
 Spoke with Sherri [name] today and info given.

## 2024-04-05 NOTE — Patient Instructions (Incomplete)
      Blood work was ordered.       Medications changes include :   None    A referral was ordered and someone will call you to schedule an appointment.     No follow-ups on file.

## 2024-04-05 NOTE — Progress Notes (Deleted)
 Subjective:    Patient ID: Sherri [name], female    DOB: [birth year], 83 y.o.   MRN: [id number]     HPI Sherri [name]  is here for follow up from the hospital  Admitted 10/28-11/4  Presented to the ED with rash.  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  She was initially on IV cefepime  via PICC line and then transition to oral Cipro .  She developed an itchy rash after the Cipro .  He was admitted back in October and stopped the Cipro - the rash completely resolved.  She resumed the Cipro  couple the hospitalization and the rash recurred.  In the ED she was found to have pancytopenia and received 1 unit PRBC.  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 Started on Valtrex  10/30 Continue Valtrex  1 g twice daily x 7 days  Cough Seems to be triggered by swallowing liquids Chest x-ray 10/31-subtle rounded opacity in the left midlung that could represent pneumonia Chest x-ray appeared similar to the one from 10/28 Cough related to swallowing, but afebrile and clinically not showing signs of pneumonia  Hypokalemia, hypomagnesemia, hypophosphatemia, hypocalcemia, hyponatremia Resolved   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 ID consulted-avoid Cipro , started on Levaquin 3-6 months  Acute anemia superimposed on anemia of chronic disease No evidence of blood loss Transfused 1 unit PRBC Hemoglobin initially 6.4-improved to 8.7 and remained stable   Pancytopenia Unclear etiology-potentially related to drug reaction, 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  Urinary catheter out-02/18/2024 urology said they could remove and replace if she did not void  Medications and allergies reviewed with patient and updated if appropriate.  Current Outpatient Medications on File Prior to Visit  Medication Sig Dispense Refill   acetaminophen  (TYLENOL ) 325 MG tablet Take 2 tablets (650 mg total) by mouth every 6 (six) hours as needed for mild pain (pain score 1-3) or fever (or Fever >/= 101). 20 tablet 0   ascorbic acid  (VITAMIN C ) 1000 MG tablet Take 1 tablet (1,000 mg total) by mouth daily. 100 tablet 0   Biotin  5000 MCG TABS Take 10,000 mcg by mouth in the morning.     Carboxymethylcellul-Glycerin  (LUBRICATING EYE DROPS OP) Place 1 drop into both eyes 3 (three) times daily as needed (dry eyes).     chlorhexidine  (PERIDEX ) 0.12 % solution Use as directed 15 mLs in the mouth or throat 4 (four) times daily. 120 mL 0   Cholecalciferol  (VITAMIN D3) 1000 units CAPS Take 1,000 Units by mouth daily.     cyclobenzaprine  (FLEXERIL ) 10 MG tablet TAKE 1 TABLET BY MOUTH THREE TIMES A DAY AS NEEDED FOR MUSCLE SPASM (Patient taking differently: Take 10 mg by mouth 3 (three) times daily as needed for muscle  spasms.) 90 tablet 2   denosumab  (PROLIA ) 60 MG/ML SOSY injection Inject 60 mg into the skin every 6 (six) months. Deliver to rheum: [address], [address], [place], [place] [address]. Appt on 07/23/2022 1 mL 0   diazepam  (VALIUM ) 5 MG tablet TAKE 1 TABLET BY MOUTH AT BEDTIME AS NEEDED FOR MUSCLE SPASMS. (Patient taking differently:  Take 5 mg by mouth at bedtime as needed (for muscle spasms).) 30 tablet 5   DULoxetine  (CYMBALTA ) 20 MG capsule TAKE 1 CAPSULE BY MOUTH AT BEDTIME. 90 capsule 1   folic acid  (FOLVITE ) 1 MG tablet Take 1 tablet (1 mg total) by mouth daily. 90 tablet 3   furosemide  (LASIX ) 20 MG tablet Take 0.5 tablets (10 mg total) by mouth daily. (Patient taking differently: Take 10 mg by mouth daily as needed (for swelling of the legs).) 30 tablet 0   Lactobacillus (ACIDOPHILUS) CAPS capsule Take 2 capsules by mouth 3 (three) times daily. 180 capsule 0   levofloxacin (LEVAQUIN) 500 MG tablet Take 1 tablet (500 mg total) by mouth daily. 30 tablet 2   lidocaine  (XYLOCAINE ) 2 % solution Use as directed 15 mLs in the mouth or throat every 3 (three) hours as needed for mouth pain. 240 mL 0   magic mouthwash (nystatin , lidocaine , diphenhydrAMINE , alum & mag hydroxide) suspension Swish and spit 5 mLs 3 (three) times daily as needed for mouth pain. 180 mL 3   Nystatin  (GERHARDT'S BUTT CREAM) CREA Apply 1 Application topically 3 (three) times daily. (Patient taking differently: Apply 1 Application topically See admin instructions. Apply as directed to buttocks after every cleansing/drying) 60 each 0   ondansetron  (ZOFRAN ) 4 MG tablet Take 1 tablet (4 mg total) by mouth every 6 (six) hours as needed for nausea. 20 tablet 0   pantoprazole  (PROTONIX ) 40 MG tablet Take 1 tablet (40 mg total) by mouth 2 (two) times daily before a meal. 60 tablet 5   phenol (CHLORASEPTIC) 1.4 % LIQD Use as directed 1 spray in the mouth or throat as needed for throat irritation / pain. 177 mL 0   polyethylene glycol powder (GLYCOLAX /MIRALAX ) 17 GM/SCOOP powder Take 17 g by mouth daily. (Patient taking differently: Take 17 g by mouth See admin instructions. Mix 17 grams into a cup of coffee and drink in the morning)     predniSONE  (DELTASONE ) 10 MG tablet Take 20 mg daily for 4 days and then 10 mg daily for 4 days.  Once this is done and continue her  home dose of 5 mg daily. 12 tablet 0   tamsulosin  (FLOMAX ) 0.4 MG CAPS capsule TAKE 1 CAPSULE BY MOUTH EVERY DAY AFTER SUPPER (Patient taking differently: Take 0.4 mg by mouth daily after supper.) 90 capsule 1   valACYclovir  (VALTREX ) 1000 MG tablet Take 1 tablet (1,000 mg total) by mouth 2 (two) times daily. 6 tablet 0   Zinc  Sulfate 220 (50 Zn) MG TABS Take 1 tablet (220 mg total) by mouth daily with supper. 100 tablet 0   No current facility-administered medications on file prior to visit.     Review of Systems     Objective:  There were no vitals filed for this visit. BP Readings from Last 3 Encounters:  03/29/24 (!) 166/95  03/17/24 125/76  03/12/24 (!) 140/74   Wt Readings from Last 3 Encounters:  03/22/24 103 lb 6.3 oz (46.9 kg)  03/17/24 102 lb 9.6 oz (46.5 kg)  03/12/24 101 lb 6.6 oz (46 kg)   There is  no height or weight on file to calculate BMI.    Physical Exam     Lab Results  Component Value Date   WBC 2.3 (L) 03/29/2024   HGB 9.2 (L) 03/29/2024   HCT 28.6 (L) 03/29/2024   PLT 274 03/29/2024   GLUCOSE 80 03/29/2024   CHOL 238 (H) 08/06/2023   TRIG 53 08/06/2023   HDL 109 08/06/2023   LDLDIRECT 122.9 11/15/2012   LDLCALC 115 (H) 08/06/2023   ALT 8 03/29/2024   AST 40 03/29/2024   NA 135 03/29/2024   K 3.8 03/29/2024   CL 104 03/29/2024   CREATININE 0.37 (L) 03/29/2024   BUN <5 (L) 03/29/2024   CO2 24 03/29/2024   TSH 3.196 11/27/2023   INR 1.1 10/02/2023   HGBA1C 6.0 (H) 08/06/2023     Assessment & Plan:    See Problem List for Assessment and Plan of chronic medical problems.

## 2024-04-06 ENCOUNTER — Telehealth: Payer: Self-pay

## 2024-04-06 ENCOUNTER — Inpatient Hospital Stay: Admitting: Internal Medicine

## 2024-04-06 DIAGNOSIS — D61818 Other pancytopenia: Secondary | ICD-10-CM

## 2024-04-06 DIAGNOSIS — E43 Unspecified severe protein-calorie malnutrition: Secondary | ICD-10-CM

## 2024-04-06 DIAGNOSIS — L89323 Pressure ulcer of left buttock, stage 3: Secondary | ICD-10-CM

## 2024-04-06 DIAGNOSIS — E871 Hypo-osmolality and hyponatremia: Secondary | ICD-10-CM

## 2024-04-06 DIAGNOSIS — D649 Anemia, unspecified: Secondary | ICD-10-CM

## 2024-04-06 DIAGNOSIS — R21 Rash and other nonspecific skin eruption: Secondary | ICD-10-CM

## 2024-04-06 DIAGNOSIS — R1310 Dysphagia, unspecified: Secondary | ICD-10-CM

## 2024-04-06 DIAGNOSIS — R5381 Other malaise: Secondary | ICD-10-CM

## 2024-04-06 DIAGNOSIS — Z96649 Presence of unspecified artificial hip joint: Secondary | ICD-10-CM

## 2024-04-06 NOTE — Telephone Encounter (Signed)
 Copied from CRM #[id number]. Topic: General - Other >> Apr 06, 2024  3:55 PM Carlyon D wrote: Reason for CRM: Dagoberto Ned calling form [facility] she is calling to get an order for a physical therapy evaluation on pt. Miss Pascual states afer pt got out of the hospital she was not doing so well and pt is asking for PT as well. Please call miss Dagoberto back. Best call back # for Dagoberto is [phone] can take a verbal order on VM as well if Dr. Does not have specific questions for miss Dagoberto.

## 2024-04-06 NOTE — Telephone Encounter (Signed)
Verbals given for PT 

## 2024-04-06 NOTE — Telephone Encounter (Signed)
 I was hoping to look at the mouth today, but if there are still ulcers we can switch to oral acyclovir-let me know if they want to try that.

## 2024-04-06 NOTE — Telephone Encounter (Signed)
 Spoke with Dagoberto and mouth looks much better.  We will follow up with patient this Friday when she comes in for follow up.

## 2024-04-07 ENCOUNTER — Ambulatory Visit: Admitting: Internal Medicine

## 2024-04-07 ENCOUNTER — Other Ambulatory Visit: Payer: Self-pay

## 2024-04-07 DIAGNOSIS — T8450XD Infection and inflammatory reaction due to unspecified internal joint prosthesis, subsequent encounter: Secondary | ICD-10-CM

## 2024-04-07 DIAGNOSIS — T8451XD Infection and inflammatory reaction due to internal right hip prosthesis, subsequent encounter: Secondary | ICD-10-CM

## 2024-04-07 NOTE — Progress Notes (Unsigned)
 Subjective:    Patient ID: Sherri  [name], female    DOB: 1940-12-16, 83 y.o.   MRN: [id number]     HPI Sherri [name]  is here for follow up from the hospital.  Sherri [name] is here with Sherri [name] son and one of Sherri [name] caregivers.  Admitted 10/28-11/4  Presented to the ED with rash.  Sherri [name] has chronic osteomyelitis of Sherri [name] right hip after arthroplasty and then revision of the arthroplasty.  Sherri [name] was initially on IV cefepime  via PICC line and then transition to oral Cipro .  Sherri [name] developed an itchy rash after the Cipro .  He was admitted back in October and stopped the Cipro - the rash completely resolved.  Sherri [name] resumed the Cipro  couple the hospitalization and the rash recurred.  In the ED Sherri [name] was found to have pancytopenia and received 1 unit PRBC.  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 Started on Valtrex  10/30 Continue Valtrex  1 g twice daily x 7 days-was not able to finish because of difficulty swallowing the pill Sherri [name] did see infectious disease yesterday and the sores were much better so Sherri [name] was advised Sherri [name] can stop the Valtrex   Cough Seems to be triggered by swallowing liquids Chest x-ray 10/31-subtle rounded opacity in the left midlung that could represent pneumonia Chest x-ray appeared similar to the one from 10/28 Cough related to swallowing, but afebrile and clinically not showing signs of pneumonia  Hypokalemia, hypomagnesemia, hypophosphatemia, hypocalcemia, hyponatremia Resolved   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 ID consulted-avoid Cipro , started on Levaquin 3-6 months  Acute anemia superimposed on  anemia of chronic disease No evidence of blood loss Transfused 1 unit PRBC Hemoglobin initially 6.4-improved to 8.7 and remained stable   Pancytopenia Unclear etiology-potentially related to drug reaction, 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  Sherri [name] was not able to complete the full course of valacyclovir  because Sherri [name] was not able to swallow the pills.  Sherri [name] does have some difficulty eating because in the mouth.  It is a little better, but still very uncomfortable.  The pressure sores on Sherri [name] buttock open very tender.  Sherri [name] has home health aides that have been making sure the area is clean and have been adjusting Sherri [name] position every 2 hours.  The area does look better, but is still not healed.  Sherri [name] states this is very painful.  Sherri [name] is trying to eat, but Sherri [name] is not able to eat more than 3 meals a day and Sherri [name] is not eating a lot when Sherri [name] does eat.  Sherri [name] is not able to snack because Sherri [name] is not hungry.  Sherri [name] is trying to eat protein.  The caregivers are pushing lots of foods however Sherri [name] weight has been stable.  Sherri [name] has to wear the brace on Sherri [name] right leg and back.  This is very uncomfortable and is irritating Sherri [name] skin.  The caregivers try to keep the left cloth between the brace and Sherri [name] skin.  Sherri [name] is tolerating all of Sherri [name] medications right now.   Urinary catheter out around 02/18/2024    Sherri [name] is having difficulty sleeping because of discomfort-pain in the buttock.  Sherri [name] is not able to move herself in the bed so Sherri [name] has to have a caregiver to help Sherri [name] turn  over.  Medications and allergies reviewed with patient and updated if appropriate.  Current Outpatient Medications on File Prior to Visit  Medication Sig Dispense Refill   acetaminophen  (TYLENOL ) 325 MG tablet Take 2 tablets (650 mg total) by mouth every 6 (six) hours as needed for mild pain (pain score 1-3) or fever (or Fever >/= 101). 20 tablet 0   ascorbic acid  (VITAMIN C ) 1000 MG tablet Take 1 tablet (1,000 mg total) by mouth daily. 100 tablet 0   Biotin  5000 MCG TABS Take 10,000 mcg by mouth in the morning.     Carboxymethylcellul-Glycerin  (LUBRICATING EYE DROPS OP) Place 1 drop into both eyes 3 (three) times daily as needed (dry eyes).     chlorhexidine  (PERIDEX ) 0.12 % solution Use as directed 15 mLs in the mouth or throat 4 (four) times daily. 120 mL 0   Cholecalciferol  (VITAMIN D3) 1000 units CAPS Take 1,000 Units by mouth daily.     cyclobenzaprine  (FLEXERIL ) 10 MG tablet TAKE 1 TABLET BY MOUTH THREE TIMES A DAY AS NEEDED FOR MUSCLE SPASM (Patient taking differently: Take 10 mg by mouth 3 (three) times daily as needed for muscle spasms.) 90 tablet 2   denosumab  (PROLIA ) 60 MG/ML SOSY injection Inject 60 mg into the skin every 6 (six) months. Deliver to rheum: [address], [place], [place] [address]. Appt on 07/23/2022 1 mL 0   diazepam  (VALIUM ) 5 MG tablet TAKE 1 TABLET BY MOUTH AT BEDTIME AS NEEDED FOR MUSCLE SPASMS. (Patient taking differently: Take 5 mg by mouth at bedtime as needed (for muscle spasms).) 30 tablet 5   DULoxetine  (CYMBALTA ) 20 MG capsule TAKE 1 CAPSULE BY MOUTH AT BEDTIME. 90 capsule 1   folic acid  (FOLVITE ) 1 MG tablet Take 1 tablet (1 mg total) by mouth daily. 90 tablet 3   furosemide  (LASIX ) 20 MG tablet Take 0.5 tablets (10 mg total) by mouth daily. (Patient taking differently: Take 10 mg by mouth daily as needed (for swelling of the legs).) 30 tablet 0   Lactobacillus (ACIDOPHILUS) CAPS capsule Take 2 capsules by mouth 3 (three) times daily. 180 capsule 0    levofloxacin (LEVAQUIN) 500 MG tablet Take 1 tablet (500 mg total) by mouth daily. 30 tablet 2   lidocaine  (XYLOCAINE ) 2 % solution Use as directed 15 mLs in the mouth or throat every 3 (three) hours as needed for mouth pain. 240 mL 0   magic mouthwash (nystatin , lidocaine , diphenhydrAMINE , alum & mag hydroxide) suspension Swish and spit 5 mLs 3 (three) times daily as needed for mouth pain. 180 mL 3   Nystatin  (GERHARDT'S BUTT CREAM) CREA Apply 1 Application topically 3 (three) times daily. (Patient taking differently: Apply 1 Application topically See admin instructions. Apply as directed to buttocks after every cleansing/drying) 60 each 0   ondansetron  (ZOFRAN ) 4 MG tablet Take 1 tablet (4 mg total) by mouth every 6 (six) hours as needed for nausea. 20 tablet 0   pantoprazole  (PROTONIX ) 40 MG tablet Take 1 tablet (40 mg total) by mouth 2 (two)  times daily before a meal. 60 tablet 5   phenol (CHLORASEPTIC) 1.4 % LIQD Use as directed 1 spray in the mouth or throat as needed for throat irritation / pain. 177 mL 0   polyethylene glycol powder (GLYCOLAX /MIRALAX ) 17 GM/SCOOP powder Take 17 g by mouth daily. (Patient taking differently: Take 17 g by mouth See admin instructions. Mix 17 grams into a cup of coffee and drink in the morning)     predniSONE  (DELTASONE ) 10 MG tablet Take 20 mg daily for 4 days and then 10 mg daily for 4 days.  Once this is done and continue Sherri [name] home dose of 5 mg daily. 12 tablet 0   tamsulosin  (FLOMAX ) 0.4 MG CAPS capsule TAKE 1 CAPSULE BY MOUTH EVERY DAY AFTER SUPPER (Patient taking differently: Take 0.4 mg by mouth daily after supper.) 90 capsule 1   valACYclovir  (VALTREX ) 1000 MG tablet Take 1 tablet (1,000 mg total) by mouth 2 (two) times daily. 6 tablet 0   Zinc  Sulfate 220 (50 Zn) MG TABS Take 1 tablet (220 mg total) by mouth daily with supper. 100 tablet 0   No current facility-administered medications on file prior to visit.     Review of Systems  Constitutional:   Negative for fever.  HENT:  Positive for mouth sores and tinnitus.   Respiratory:  Positive for cough (coughs up gray phlegm intermittently). Negative for shortness of breath.   Cardiovascular:  Positive for leg swelling. Negative for chest pain and palpitations.  Gastrointestinal:  Positive for constipation and nausea. Negative for abdominal pain.       No gerd  Genitourinary:  Positive for dysuria.  Skin:  Positive for wound (sacral).  Neurological:  Negative for dizziness and headaches.  Psychiatric/Behavioral:  Negative for dysphoric mood.        Objective:   Vitals:   04/08/24 1308  BP: 120/64  Pulse: (!) 102  Temp: 98.2 F (36.8 C)  SpO2: 95%   BP Readings from Last 3 Encounters:  04/08/24 120/64  03/29/24 (!) 166/95  03/17/24 125/76   Wt Readings from Last 3 Encounters:  04/08/24 92 lb (41.7 kg)  03/22/24 103 lb 6.3 oz (46.9 kg)  03/17/24 102 lb 9.6 oz (46.5 kg)   Body mass index is 17.38 kg/m.    Physical Exam Constitutional:      General: Sherri [name] is not in acute distress.    Comments: Skin, fragile elderly female sitting in wheelchair  HENT:     Head: Normocephalic and atraumatic.     Mouth/Throat:     Mouth: Mucous membranes are dry.     Comments: No obvious white coating on tongue.  No obvious oral ulcers.  Slight swelling of tongue, inflammation Eyes:     Conjunctiva/sclera: Conjunctivae normal.  Cardiovascular:     Rate and Rhythm: Normal rate and regular rhythm.  Pulmonary:     Effort: Pulmonary effort is normal. No respiratory distress.     Breath sounds: Normal breath sounds. No wheezing or rales.  Musculoskeletal:     Cervical back: Neck supple. No tenderness.  Lymphadenopathy:     Cervical: No cervical adenopathy.  Skin:    General: Skin is warm and dry.     Findings: Bruising (Senile purpura) present.     Comments: Several Band-Aids bilateral lower legs from skin tears  Psychiatric:        Mood and Affect: Mood normal.        Lab  Results  Component Value Date  WBC 2.3 (L) 03/29/2024   HGB 9.2 (L) 03/29/2024   HCT 28.6 (L) 03/29/2024   PLT 274 03/29/2024   GLUCOSE 80 03/29/2024   CHOL 238 (H) 08/06/2023   TRIG 53 08/06/2023   HDL 109 08/06/2023   LDLDIRECT 122.9 11/15/2012   LDLCALC 115 (H) 08/06/2023   ALT 8 03/29/2024   AST 40 03/29/2024   NA 135 03/29/2024   K 3.8 03/29/2024   CL 104 03/29/2024   CREATININE 0.37 (L) 03/29/2024   BUN <5 (L) 03/29/2024   CO2 24 03/29/2024   TSH 3.196 11/27/2023   INR 1.1 10/02/2023   HGBA1C 6.0 (H) 08/06/2023     Assessment & Plan:    See Problem List for Assessment and Plan of chronic medical problems.

## 2024-04-07 NOTE — Patient Instructions (Addendum)
 Continue levofloxacin You'll need this indefinitely   Check with your surgeon about the brace and the non-healing incision  You can stop valtrex  - cold sore goes away on its own  See us  in 4-6 weeks

## 2024-04-07 NOTE — Progress Notes (Signed)
 [facility]  Patient Active Problem List   Diagnosis Date Noted   Acute anemia 04/05/2024   Severe malnutrition 04/05/2024   Pancytopenia (HCC) 03/22/2024   Rash 03/10/2024   Drug reaction 03/10/2024   Peeling skin 03/10/2024   HOH (hard of hearing)    Foley catheter in place 02/03/2024   Prosthetic hip infection 01/25/2024   Pseudomonas aeruginosa infection 01/25/2024   Hip dislocation, right (HCC) 01/19/2024   Chronic pain 01/19/2024   Anemia of chronic disease 12/31/2023   Multiple skin tears 12/31/2023   Debility 12/08/2023   History of revision of total replacement of right hip joint 12/08/2023   Postoperative wound infection 12/08/2023   Closed fracture of right elbow 12/08/2023   Wound dehiscence 11/27/2023   Macrocytic anemia 11/27/2023   Lightheadedness 11/10/2023   Physical deconditioning 11/10/2023   Parotid swelling 11/10/2023   Hip fracture (HCC) 11/10/2023   Pressure sore on buttocks 10/03/2023   Hyponatremia 10/02/2023   Fall at home, initial encounter 10/02/2023   Cellulitis of right lower extremity 10/02/2023   Cervical spondylosis with myelopathy and radiculopathy 04/23/2022   Constipation 03/04/2022   Upper back pain on left side 03/04/2022   Lumbar vertebral fracture, pathologic 09/20/2021   Degenerative spondylolisthesis 08/19/2021   Senile purpura 12/19/2020   Restless leg 02/02/2020   Bilateral leg edema 01/11/2020   Cervical radiculopathy at C8 06/28/2019   Carpal tunnel syndrome on both sides 06/28/2019   Right sided sciatica 04/14/2018   Trochanteric bursitis of right hip 04/14/2018   H/O cervical spine surgery 11/10/2017   Dysphagia 10/26/2017   GERD (gastroesophageal reflux disease) 08/06/2017   Easy bruising 08/06/2017   Mid back pain 03/26/2017   Submandibular gland swelling 01/08/2017   High risk medication use 09/02/2016   Piriformis muscle pain 09/02/2016   Right hip pain 04/08/2016   Right  knee pain 02/25/2016   Numbness and tingling in left hand 11/14/2014   Biceps tendon tear 10/10/2014   Insomnia 10/10/2014   Gait disturbance 06/13/2014   OP (osteoporosis) 01/18/2014   Other long term (current) drug therapy 01/18/2014   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 01/01/2013   History of colonic polyps 07/03/2010   Nocturia 05/28/2010   THYROID  NODULE 05/17/2008   Multiple sclerosis 05/17/2008   Other fatigue 03/30/2007   Hyperlipidemia 08/20/2006   Essential hypertension 08/20/2006   Rheumatoid arthritis (HCC) 08/20/2006   Osteoarthritis 08/20/2006   CERVICAL CANCER, HX OF 08/20/2006   GASTRIC ULCER, HX OF 08/20/2006      Subjective:    Patient ID: Linah  A Whetsell, female    DOB: [birth year], 83 y.o.   MRN: [id number]  Chief Complaint  Patient presents with   Follow-up    Right hip PJI     HPI:  Sherri [name] is a 83 y.o. 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 Abx changed to levofloxacin. Received prednisone  during admission and rash had nearly resolved. 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 She is to see her surgeon next week and still have stitches  She is on chronic prednisone  5 mg and is coming down on a taper now with the rash She has also fragile skin  several breakage on extremities   Home therapy to start next week Transfer from wheel chair to bed and a few steps Lifting right leg for first time this week   Allergies  Allergen Reactions   Ciprofloxacin  Itching, Nausea Only, Rash and Other (See Comments)    Blistering rash and oral ulcers occurred when retried Cipro  and severe mouth pain Made her sick- stomach hurt. (Also)    Darvon [Propoxyphene Hcl] Shortness Of  Breath and Other (See Comments)    ? Dose Related ? Lowered respirations greatly   Demerol [Meperidine] Shortness Of Breath and Other (See Comments)    Respiratory Distress   Penicillins Hives and Other (See Comments)    Has patient had a PCN reaction causing immediate rash, facial/tongue/throat swelling, SOB or lightheadedness with hypotension: No SEVERE RASH INVOLVING MUCUS MEMBRANES or SKIN NECROSIS: #  #  #  YES  #  #  #  Has patient had a PCN reaction that required hospitalization No Has patient had a PCN reaction occurring within the last 10 years: No.    Imuran [Azathioprine] Other (See Comments)    Weakness   Sulfa  Antibiotics Nausea Only and Other (See Comments)    States had severe indigestion and heartburn and nausea and had to stop taking   Arava [Leflunomide] Other (See Comments)    Excessive weight gain   Lipitor [Atorvastatin ] Nausea And Vomiting    Currently taking 2 times weekly - able to tolerate low dose    Vytorin [Ezetimibe-Simvastatin] Nausea And Vomiting      Outpatient Medications Prior to Visit  Medication Sig Dispense Refill   acetaminophen  (TYLENOL ) 325 MG tablet Take 2 tablets (650 mg total) by mouth every 6 (six) hours as needed for mild pain (pain score 1-3) or fever (or Fever >/= 101). 20 tablet 0   ascorbic acid  (VITAMIN C ) 1000 MG tablet Take 1 tablet (1,000 mg total) by mouth daily. 100 tablet 0   Biotin  5000 MCG TABS Take 10,000 mcg by mouth in the morning.     Carboxymethylcellul-Glycerin  (LUBRICATING EYE DROPS OP) Place 1 drop into both eyes 3 (three) times daily as needed (dry eyes).     chlorhexidine  (PERIDEX ) 0.12 % solution Use as directed 15 mLs in the mouth or throat 4 (four) times daily. 120 mL 0   Cholecalciferol  (VITAMIN D3) 1000 units CAPS Take 1,000 Units by mouth daily.     cyclobenzaprine  (FLEXERIL ) 10 MG tablet TAKE 1 TABLET BY MOUTH THREE TIMES A DAY AS NEEDED FOR MUSCLE SPASM (Patient taking differently: Take 10 mg by mouth 3  (three) times daily as needed for muscle spasms.) 90 tablet 2   denosumab  (PROLIA ) 60 MG/ML SOSY injection Inject 60 mg into the skin every 6 (six) months. Deliver to rheum: [address], [place], [place] [address]. Appt on 07/23/2022 1 mL 0   diazepam  (VALIUM ) 5 MG tablet TAKE 1 TABLET BY MOUTH AT BEDTIME AS NEEDED FOR MUSCLE SPASMS. (Patient taking differently: Take 5 mg by mouth at bedtime as needed (for muscle spasms).) 30 tablet 5   DULoxetine  (CYMBALTA ) 20 MG capsule TAKE 1 CAPSULE BY MOUTH AT BEDTIME. 90 capsule 1   folic acid  (FOLVITE ) 1 MG tablet Take 1 tablet (1 mg total) by mouth daily. 90 tablet 3   furosemide  (LASIX ) 20 MG tablet Take 0.5 tablets (10 mg total) by mouth daily. (Patient taking differently: Take 10 mg by mouth daily as needed (for swelling of the legs).) 30 tablet 0  Lactobacillus (ACIDOPHILUS) CAPS capsule Take 2 capsules by mouth 3 (three) times daily. 180 capsule 0   levofloxacin (LEVAQUIN) 500 MG tablet Take 1 tablet (500 mg total) by mouth daily. 30 tablet 2   lidocaine  (XYLOCAINE ) 2 % solution Use as directed 15 mLs in the mouth or throat every 3 (three) hours as needed for mouth pain. 240 mL 0   magic mouthwash (nystatin , lidocaine , diphenhydrAMINE , alum & mag hydroxide) suspension Swish and spit 5 mLs 3 (three) times daily as needed for mouth pain. 180 mL 3   Nystatin  (GERHARDT'S BUTT CREAM) CREA Apply 1 Application topically 3 (three) times daily. (Patient taking differently: Apply 1 Application topically See admin instructions. Apply as directed to buttocks after every cleansing/drying) 60 each 0   ondansetron  (ZOFRAN ) 4 MG tablet Take 1 tablet (4 mg total) by mouth every 6 (six) hours as needed for nausea. 20 tablet 0   pantoprazole  (PROTONIX ) 40 MG tablet Take 1 tablet (40 mg total) by mouth 2 (two) times daily before a meal. 60 tablet 5   phenol (CHLORASEPTIC) 1.4 % LIQD Use as directed 1 spray in the mouth or throat as needed for throat irritation /  pain. 177 mL 0   polyethylene glycol powder (GLYCOLAX /MIRALAX ) 17 GM/SCOOP powder Take 17 g by mouth daily. (Patient taking differently: Take 17 g by mouth See admin instructions. Mix 17 grams into a cup of coffee and drink in the morning)     predniSONE  (DELTASONE ) 10 MG tablet Take 20 mg daily for 4 days and then 10 mg daily for 4 days.  Once this is done and continue her home dose of 5 mg daily. 12 tablet 0   tamsulosin  (FLOMAX ) 0.4 MG CAPS capsule TAKE 1 CAPSULE BY MOUTH EVERY DAY AFTER SUPPER (Patient taking differently: Take 0.4 mg by mouth daily after supper.) 90 capsule 1   valACYclovir  (VALTREX ) 1000 MG tablet Take 1 tablet (1,000 mg total) by mouth 2 (two) times daily. 6 tablet 0   Zinc  Sulfate 220 (50 Zn) MG TABS Take 1 tablet (220 mg total) by mouth daily with supper. 100 tablet 0   No facility-administered medications prior to visit.     Social History   Socioeconomic History   Marital status: Married    Spouse name: Cheryl   Number of children: 2   Years of education: Not on file   Highest education level: Not on file  Occupational History   Occupation: retired  Tobacco Use   Smoking status: Former    Current packs/day: 0.00    Average packs/day: 2.5 packs/day for 20.0 years (50.0 ttl pk-yrs)    Types: Cigarettes    Start date: 05/26/1961    Quit date: 05/26/1981    Years since quitting: 42.8    Passive exposure: Never   Smokeless tobacco: Never   Tobacco comments:    Smoked [phone], up to 2.5 ppd  Vaping Use   Vaping status: Never Used  Substance and Sexual Activity   Alcohol  use: Yes    Alcohol /week: 14.0 standard drinks of alcohol     Types: 14 Cans of beer per week    Comment: 2% beer   Drug use: Never   Sexual activity: Yes    Birth control/protection: Surgical    Comment: Hysterectomy  Other Topics Concern   Not on file  Social History Narrative   Not on file   Social Drivers of Health   Financial Resource Strain: Low Risk  (02/21/2022)   Overall  Financial  Resource Strain (CARDIA)    Difficulty of Paying Living Expenses: Not hard at all  Food Insecurity: No Food Insecurity (03/30/2024)   Hunger Vital Sign    Worried About Running Out of Food in the Last Year: Never true    Ran Out of Food in the Last Year: Never true  Transportation Needs: No Transportation Needs (03/30/2024)   PRAPARE - Administrator, Civil Service (Medical): No    Lack of Transportation (Non-Medical): No  Physical Activity: Inactive (02/21/2022)   Exercise Vital Sign    Days of Exercise per Week: 0 days    Minutes of Exercise per Session: 0 min  Stress: Stress Concern Present (02/21/2022)   [employer] of Occupational Health - Occupational Stress Questionnaire    Feeling of Stress : Rather much  Social Connections: Socially Integrated (03/23/2024)   Social Connection and Isolation Panel    Frequency of Communication with Friends and Family: More than three times a week    Frequency of Social Gatherings with Friends and Family: More than three times a week    Attends Religious Services: 1 to 4 times per year    Active Member of [employer] or Organizations: Yes    Attends Engineer, Structural: More than 4 times per year    Marital Status: Married  Catering Manager Violence: Not At Risk (03/23/2024)   Humiliation, Afraid, Rape, and Kick questionnaire    Fear of Current or Ex-Partner: No    Emotionally Abused: No    Physically Abused: No    Sexually Abused: No      Review of Systems    All other ros negative  Objective:    There were no vitals taken for this visit. Nursing note and vital signs reviewed.  Physical Exam     General/constitutional: no distress, pleasant HEENT: Normocephalic, PER, Conj Clear, EOMI, Oropharynx clear Neck supple CV: rrr no mrg Lungs: clear to auscultation, normal respiratory effort Abd: Soft, Nontender Ext/skin: several bandaids on extremities with band-aids in place Msk: wears brace around hip;  in wheelchair Neuro: nonfocal  Labs: Lab Results  Component Value Date   WBC 2.3 (L) 03/29/2024   HGB 9.2 (L) 03/29/2024   HCT 28.6 (L) 03/29/2024   MCV 93.2 03/29/2024   PLT 274 03/29/2024   Last metabolic panel Lab Results  Component Value Date   GLUCOSE 80 03/29/2024   NA 135 03/29/2024   K 3.8 03/29/2024   CL 104 03/29/2024   CO2 24 03/29/2024   BUN <5 (L) 03/29/2024   CREATININE 0.37 (L) 03/29/2024   GFRNONAA >60 03/29/2024   CALCIUM  7.7 (L) 03/29/2024   PHOS 3.0 03/29/2024   PROT 5.0 (L) 03/29/2024   ALBUMIN  2.5 (L) 03/29/2024   LABGLOB 2.2 06/20/2019   AGRATIO 1.9 06/20/2019   BILITOT 0.4 03/29/2024   ALKPHOS 70 03/29/2024   AST 40 03/29/2024   ALT 8 03/29/2024   ANIONGAP 7 03/29/2024    Micro:  Serology:  Imaging:  Assessment & Plan:   Problem List Items Addressed This Visit   None Visit Diagnoses       Infection of prosthetic joint, subsequent encounter    -  Primary         No orders of the defined types were placed in this encounter.    83 yo female history RA, MS, hyperlipidemia, s/p spinal hardware, multiple orthopedic surgeries notable for right hip arthroplasty complicated by chronic right prosthetic hip infection (pseudomonas)  Patient has had  recurrent right prosthetic hip dislocation. The last dislocation was 01/21/24 needing open reduction; cx sent and that grew pseudomonas started on cefepime  until 03/07/24 and then switched to cipro   Patient admitted 03/23/24 for rash thought to be related to cipro . She was switched to levofloxacin. Had pancytopenia during this admission - oncology evaluated no bone marrow biposy. No clear etiology. She did receive methylprednisolone . Of note, one lesion from an oral lesion was positive for hsv 1 on pcr received 7 days valtrex   She transitioned to levofloxacin   ------------ 04/07/24 id clinic assessment Very fragile skin, malnutrition, and multiple comorbidities at risk for more  infection  She is so far tolerating levoflox and will leave it on indefinitely. No side effect including myalgia  She inquires about the brace which she'll need to talk to orthopedic surgery  She still has stitches on the incision as it hadn't fully closed and drains clears  She just discharge from the hospital and will avoid labs for now  She complains of inability to swallow valtrex  and the sore is much better -- exam today no ulcer. Can stop valtrex    Follow up in around 4-6 weeks    Follow-up: Return in about 5 weeks (around 05/12/2024).      Constance ONEIDA Passer, MD [facility] 04/07/2024, 3:25 PM

## 2024-04-08 ENCOUNTER — Ambulatory Visit: Payer: Self-pay | Admitting: Internal Medicine

## 2024-04-08 ENCOUNTER — Encounter: Payer: Self-pay | Admitting: Internal Medicine

## 2024-04-08 ENCOUNTER — Ambulatory Visit: Admitting: Internal Medicine

## 2024-04-08 VITALS — BP 120/64 | HR 102 | Temp 98.2°F | Ht 61.0 in | Wt 92.0 lb

## 2024-04-08 DIAGNOSIS — R1310 Dysphagia, unspecified: Secondary | ICD-10-CM

## 2024-04-08 DIAGNOSIS — H6123 Impacted cerumen, bilateral: Secondary | ICD-10-CM | POA: Insufficient documentation

## 2024-04-08 DIAGNOSIS — E43 Unspecified severe protein-calorie malnutrition: Secondary | ICD-10-CM

## 2024-04-08 DIAGNOSIS — B002 Herpesviral gingivostomatitis and pharyngotonsillitis: Secondary | ICD-10-CM

## 2024-04-08 DIAGNOSIS — R3981 Functional urinary incontinence: Secondary | ICD-10-CM

## 2024-04-08 DIAGNOSIS — T8459XD Infection and inflammatory reaction due to other internal joint prosthesis, subsequent encounter: Secondary | ICD-10-CM

## 2024-04-08 DIAGNOSIS — R600 Localized edema: Secondary | ICD-10-CM

## 2024-04-08 DIAGNOSIS — I10 Essential (primary) hypertension: Secondary | ICD-10-CM | POA: Diagnosis not present

## 2024-04-08 DIAGNOSIS — H9193 Unspecified hearing loss, bilateral: Secondary | ICD-10-CM

## 2024-04-08 DIAGNOSIS — L89323 Pressure ulcer of left buttock, stage 3: Secondary | ICD-10-CM

## 2024-04-08 DIAGNOSIS — R21 Rash and other nonspecific skin eruption: Secondary | ICD-10-CM

## 2024-04-08 DIAGNOSIS — D638 Anemia in other chronic diseases classified elsewhere: Secondary | ICD-10-CM | POA: Diagnosis not present

## 2024-04-08 DIAGNOSIS — R5381 Other malaise: Secondary | ICD-10-CM

## 2024-04-08 DIAGNOSIS — G479 Sleep disorder, unspecified: Secondary | ICD-10-CM

## 2024-04-08 MED ORDER — ACYCLOVIR 200 MG/5ML PO SUSP: 400.0000 mg | Freq: Every day | ORAL | 0 refills | Status: AC

## 2024-04-08 MED ORDER — MIRTAZAPINE 7.5 MG PO TABS: 7.5000 mg | ORAL_TABLET | Freq: Every day | ORAL | 5 refills | Status: DC

## 2024-04-08 NOTE — Assessment & Plan Note (Signed)
 Subacute Had diagnosed herpes with oral infection while in the hospital Started on Valtrex , but was not able to complete the full course because she was not able to swallow the pills Still having a fair amount of mouth discomfort although it is slightly better No obvious oral ulcers, but some very mild generalized swelling and dryness Given that she still has significant discomfort we will try oral acyclovir to see if that helps If that does not help consider treatment of thrush

## 2024-04-08 NOTE — Assessment & Plan Note (Addendum)
 Recurrent Most likely drug reaction Likely related to ciprofloxacin  Resolved Currently taking Levaquin and tolerating it well

## 2024-04-08 NOTE — Assessment & Plan Note (Signed)
 Chronic She is trying to gain weight She is trying to eat more protein and more food in general Her stomach is small and she is not able to tolerate larger portions and does not have any hunger for snacks Stressed that the only way she will gain weight is to increase her calorie intake and she needs to keep trying Will start mirtazapine 7.5 mg nightly for sleep and also to help improve her appetite

## 2024-04-08 NOTE — Assessment & Plan Note (Signed)
 Chronic Wears hearing aids Her current hearing aids are not working and she is not able to hear well She does have some cerumen in her ears, but I do not think that is the only thing that is affecting her hearing She did see her audiologist and they advised there was nothing that they could do for her Referral to ENT

## 2024-04-08 NOTE — Assessment & Plan Note (Signed)
 Chronic This is causing significant pain She has caregivers that are monitoring this closely and it is [place], but slow They are trying to keep her weight off of this is much as possible, but is difficult because she is not able to walk Continue to shift weight Continue to sit on air pads or soft surfaces Stressed that the only way this is going to heal is by keeping the pressure off that area Monitor closely for infection, but she is on Levaquin which should help prevent

## 2024-04-08 NOTE — Assessment & Plan Note (Signed)
 Acute She does have excessive cerumen in her ears-this could be contributing some to her hearing loss Referral to ENT

## 2024-04-08 NOTE — Assessment & Plan Note (Signed)
 Chronic Advised dysphagia diet She is drinking thickened liquids

## 2024-04-08 NOTE — Assessment & Plan Note (Signed)
 Chronic Blood pressure currently controlled Currently only on furosemide  10 mg daily-other medications have been stopped Monitor BP

## 2024-04-08 NOTE — Patient Instructions (Addendum)
      Medications changes include :   acyclovir 5 times daily x 5 days, mirtazapine 7.5 mg at bedtime       Return for follow up as scheduled.

## 2024-04-08 NOTE — Assessment & Plan Note (Signed)
 Chronic Back on furosemide  10 mg daily When legs are elevated swelling is controlled

## 2024-04-08 NOTE — Assessment & Plan Note (Signed)
 Having difficulty sleep Some of her sleep abilities are related to pain, wearing a leg brace and back brace, not being able to turn over in bed She has some medications that she can take as needed that may help with sleep, but we will try starting mirtazapine 7.5 mg nightly-hopefully this will help with her appetite as well

## 2024-04-08 NOTE — Assessment & Plan Note (Signed)
 Chronic Significant physical deconditioning She does have PT and OT at home right now Currently wheelchair-bound Has brace on right leg and back She can take a few steps with her walker, but is not supposed to do more walking because the right hip wound is not completely healed and orthopedics does not want her walking much because of the risk of falling

## 2024-04-08 NOTE — Assessment & Plan Note (Signed)
 Chronic S/p IV antibiotics Now on chronic Levaquin therapy which she is tolerating well

## 2024-04-08 NOTE — Assessment & Plan Note (Signed)
 Chronic Acute on chronic anemia with this most recent hospitalization S/p 1 unit PRBC CBC stable upon discharge

## 2024-04-10 DIAGNOSIS — R3981 Functional urinary incontinence: Secondary | ICD-10-CM | POA: Insufficient documentation

## 2024-04-10 MED ORDER — REALITY INCONTINENT BRIEFS SM MISC: 5 refills | Status: AC

## 2024-04-10 NOTE — Assessment & Plan Note (Signed)
 Subacute Currently wheelchair bound - can transfer w/ assistance, but is not able to walk She is incontinence of urine and require incontinence briefs - this is medically necessary She has a sacral wound and needs to be changed frequently to avoid infection

## 2024-04-10 NOTE — Addendum Note (Signed)
 Addended by: [name] PARAS on: 04/10/2024 11:18 AM   Modules accepted: Orders

## 2024-04-15 ENCOUNTER — Ambulatory Visit: Payer: Self-pay

## 2024-04-15 NOTE — Telephone Encounter (Signed)
 Message left for patient today.

## 2024-04-15 NOTE — Telephone Encounter (Signed)
 FYI Only or Action Required?: Action required by provider: clinical question for provider and update on patient condition.  Patient was last seen in primary care on 04/08/2024 by [name] PARAS, MD.  Called Nurse Triage reporting Leg Swelling.  Symptoms began ongoing for months.  Interventions attempted: Prescription medications: Lasix .  Symptoms are: gradually worsening.  Triage Disposition: See Physician Within 24 Hours  Patient/caregiver understands and will follow disposition?: No, wishes to speak with PCP     Copied from CRM #[id number]. Topic: Clinical - Red Word Triage >> Apr 15, 2024 10:29 AM Alfonso ORN wrote: Red Word that prompted transfer to Nurse Triage: worsening swelling    pt was taking furosemide  (LASIX ) 20 MG tablet prior to hospital . after feet swelling was told to take half tablet every other day, pt legs swelling so bad that they ache and is requesting to increase to full tablet of furosemide  (LASIX ) 20 MG tablet      Reason for Disposition  [1] MODERATE leg swelling (e.g., swelling extends up to knees) AND [2] new-onset or getting worse  Answer Assessment - Initial Assessment Questions Patient has declined an appointment. Patient reports her Lasix  was reduced recently and would like to go back to a full dose of Lasix  daily.     1. ONSET: When did the swelling start? (e.g., minutes, hours, days)     Ongoing for a couple of months  2. LOCATION: What part of the leg is swollen?  Are both legs swollen or just one leg?     Bilateral legs  3. SEVERITY: How bad is the swelling? (e.g., localized; mild, moderate, severe)     Moderate, up to knee  4. REDNESS: Is there redness or signs of infection?     No 5. PAIN: Is the swelling painful to touch? If Yes, ask: How painful is it?   (Scale 1-10; mild, moderate or severe)     Mild to moderate  6. FEVER: Do you have a fever? If Yes, ask: What is it, how was it measured, and when did it start?       No 7. CAUSE: What do you think is causing the leg swelling?     History of the same  8. MEDICAL HISTORY: Do you have a history of blood clots (e.g., DVT), cancer, heart failure, kidney disease, or liver failure?     Yes 9. RECURRENT SYMPTOM: Have you had leg swelling before? If Yes, ask: When was the last time? What happened that time?     Yes 10. OTHER SYMPTOMS: Do you have any other symptoms? (e.g., chest pain, difficulty breathing)       No  Protocols used: Leg Swelling and Edema-A-AH

## 2024-04-15 NOTE — Telephone Encounter (Signed)
 Yes, restart Lasix .  Be cautious of it causing lower blood pressure, lightheadedness-can start with 10 mg daily, but we can increase to 1 full pill if swelling is not improving

## 2024-04-18 ENCOUNTER — Other Ambulatory Visit: Payer: Self-pay | Admitting: Internal Medicine

## 2024-04-18 ENCOUNTER — Ambulatory Visit: Payer: Self-pay

## 2024-04-18 MED ORDER — FUROSEMIDE 20 MG PO TABS: 10.0000 mg | ORAL_TABLET | Freq: Every day | ORAL | 0 refills | Status: DC

## 2024-04-18 NOTE — Telephone Encounter (Signed)
 FYI Only or Action Required?: Action required by provider: update on patient condition.  Patient was last seen in primary care on 04/08/2024 by [name] PARAS, MD.  Called Nurse Triage reporting Foot Swelling.  Symptoms began several days ago.  Interventions attempted: Prescription medications: furosemide .  Symptoms are: gradually worsening.  Triage Disposition: See PCP When Office is Open (Within 3 Days)  Patient/caregiver understands and will follow disposition?: Yes  Copied from CRM #[id number]. Topic: Clinical - Red Word Triage >> Apr 18, 2024 12:12 PM Paige D wrote: Red Word that prompted transfer to Nurse Triage: Pt feet and ankles are swollen and have bene swollen for a while. Reason for Disposition  MODERATE ankle swelling (e.g., interferes with normal activities, can't move joint normally) (Exceptions: Itchy, localized swelling; swelling is chronic.)  Answer Assessment - Initial Assessment Questions Patient has been taking furosemide  whole pill this weekend but says Dr Geofm told her to take it for 2 days then stop. So she hasn't taken it yet today but says ankles are already swelling worse. Patient has a few pills left, needs refill. Per Dr Geofm, Yes, restart Lasix .  Be cautious of it causing lower blood pressure, lightheadedness-can start with 10 mg daily, but we can increase to 1 full pill if swelling is not improving. Patient verbalizes understanding.  Had taken furosemide  and made swelling go down. Needs refilled  1. LOCATION: Which ankle is swollen? Where is the swelling?     Both calves and feet 2. ONSET: When did the swelling start?     Patient reports worsening swelling over the weekend 11/21 3. SWELLING: How bad is the swelling? Or, How large is it? (e.g., mild, moderate, severe; size of localized swelling)      Entire feet and calves 4. PAIN: Is there any pain? If Yes, ask: How bad is it? (Scale 0-10; or none, mild, moderate, severe)     Pain is  moderate at night and keeps her awake. 5. CAUSE: What do you think caused the ankle swelling?     Has MS, in wheelchair. Has this problem continuously. 6. OTHER SYMPTOMS: Do you have any other symptoms? (e.g., fever, chest pain, difficulty breathing, calf pain)     Skin weeping, pain in feet and calves at night.  Protocols used: Ankle Swelling-A-AH

## 2024-04-19 ENCOUNTER — Ambulatory Visit: Payer: Self-pay

## 2024-04-19 ENCOUNTER — Telehealth: Payer: Self-pay

## 2024-04-19 NOTE — Telephone Encounter (Signed)
 Spoke with patient today.

## 2024-04-19 NOTE — Telephone Encounter (Signed)
 Copied from CRM [id number]. Topic: Clinical - Medication Question >> Apr 19, 2024  1:46 PM Robinson H wrote: Reason for CRM: Patients daughter Luke calling to confirm dosage and how patient is supposed to take the furosemide  (LASIX ) 20 MG tablets, Kim isn't DPR and states she lives over an hour away from her mom and trying to clarify, states patient is confused on dosage.  Geneve  [phone]

## 2024-04-19 NOTE — Telephone Encounter (Signed)
 Refill was sent yesterday.

## 2024-04-19 NOTE — Telephone Encounter (Signed)
 Spoke directly with patient today.

## 2024-04-19 NOTE — Telephone Encounter (Signed)
 FYI Only or Action Required?: Action required by provider: clinical question for provider and update on patient condition.  Patient was last seen in primary care on 04/08/2024 by Sherri Glade PARAS, MD.  Called Nurse Triage reporting Leg Swelling.  Symptoms began yesterday.  Interventions attempted: Prescription medications: furosemide  and Rest, hydration, or home remedies.  Symptoms are: rapidly worsening.  Triage Disposition: Call PCP Now (overriding See Physician Within 24 Hours)  Patient/caregiver understands and will follow disposition?: Yes  Copied from CRM #[id number]. Topic: Clinical - Red Word Triage >> Apr 19, 2024  1:16 PM Sherri [name] wrote: [facility] that prompted transfer to Nurse Triage: swollen legs Pt had been triaged previously. Pt did receive a prescription for Furosemide   however she says it was half the amount. Pt also says her legs were twice as big as they were . Reason for Disposition  [1] MODERATE leg swelling (e.g., swelling extends up to knees) AND [2] new-onset or getting worse  Answer Assessment - Initial Assessment Questions Pt called in reported increased swelling in lower legs and feet. Pt worried that legs are so swollen they are going to burst open. Pt reports moderate pain, 6/10 and stated she feels swelling is due to furosemide  dosing being decreased. She stated with her previous script, she took a full pill and it kept swelling down. Now she is taking a half and her legs have doubled in size since yesterday. Pt keeps legs elevated, even at night and is wheelchair bound. Discussed importance of maintaining elevation and hygiene to reduce risk of infection. Pt requested message be sent to Dr Sherri for medication review. Patient agrees with plan of care, and will call back if anything changes, or if symptoms worsen.     1. ONSET: When did the swelling start? (e.g., minutes, hours, days)     Ongoing  2. LOCATION: What part of the leg is swollen?  Are both  legs swollen or just one leg?  Lower legs, bilaterally      3. SEVERITY: How bad is the swelling? (e.g., localized; mild, moderate, severe)     Moderate; no visible fluid but pt reports at night her sheets are wet where legs are  4. REDNESS: Is there redness or signs of infection?     Redness to both feet and legs  5. PAIN: Is the swelling painful to touch? If Yes, ask: How painful is it?   (Scale 1-10; mild, moderate or severe)     6; stings and burns   6. FEVER: Do you have a fever? If Yes, ask: What is it, how was it measured, and when did it start?       7. CAUSE: What do you think is causing the leg swelling?     Reduced dose of furosemide   8. MEDICAL HISTORY: Do you have a history of blood clots (e.g., DVT), cancer, heart failure, kidney disease, or liver failure?     None  9. RECURRENT SYMPTOM: Have you had leg swelling before? If Yes, ask: When was the last time? What happened that time?     Yes; recurrent issue  10. OTHER SYMPTOMS: Do you have any other symptoms? (e.g., chest pain, difficulty breathing)       None  Protocols used: Leg Swelling and Edema-A-AH

## 2024-04-20 ENCOUNTER — Telehealth: Payer: Self-pay

## 2024-04-20 ENCOUNTER — Other Ambulatory Visit: Payer: Self-pay | Admitting: *Deleted

## 2024-04-20 ENCOUNTER — Other Ambulatory Visit: Payer: Self-pay | Admitting: Physician Assistant

## 2024-04-20 NOTE — Telephone Encounter (Signed)
 Copied from CRM #[id number]. Topic: Clinical - Medication Question >> Apr 20, 2024 12:23 PM China J wrote: Reason for CRM: Patient was prescribed acidophilus in the hospital and she was supposed to take 2 pills 3 times a day. The medication did not have a dose listed but she is now out of medication and was wondering if this is something Dr. Geofm wanted her to keep taking.  Please call daughter Rick Bruns) for an update: [phone]

## 2024-04-20 NOTE — Telephone Encounter (Signed)
 Patient's daughter Luke contacted the office about a refill on Prednisone .   Last Fill: 05/22/2023  Next Visit: 04/25/2024  Last Visit: 08/06/2023  Dx:  Rheumatoid arthritis involving multiple sites with positive rheumatoid factor   Current Dose per office note on 08/06/2023: prednisone  5 mg 1 tablet by mouth daily.   Okay to refill Prednisone ?

## 2024-04-23 MED ORDER — PREDNISONE 5 MG PO TABS: 5.0000 mg | ORAL_TABLET | Freq: Every day | ORAL | 0 refills | Status: DC

## 2024-04-24 NOTE — Progress Notes (Unsigned)
 Office Visit Note  Patient: Sherri [name]             Date of Birth: [birth year]           MRN: [id number]             PCP: [name] PARAS, MD Referring: [name] PARAS, MD Visit Date: 04/25/2024 Occupation: Data Unavailable  Subjective:  Recent hospitalizations  History of Present Illness: Sherri [name] is a 83 y.o. female with history of rheumatoid arthritis.  Patient was last seen in the office on 08/06/23.  Patient has had several hospitalizations and surgical interventions since her last office visit.  She has been off both of [name]  and methotrexate  since early spring 2025.  She is taking prednisone  5 mg 1 tablet daily.  Patient underwent a right total hip arthroplasty on 11/12/2023 after fracturing the right hip.  She also underwent an open reduction internal fixation of the right elbow on 11/20/2023.  Patient had several complications during the postoperative period leading to a right total hip revision x2.  Patient states that she has been essentially bedridden but will be starting physical therapy twice a week starting this week.  Patient states that she continues to have total body pain and has to have assistance 24 hours per day.   Patient states that she experiences some stiffness due to underlying rheumatoid arthritis but overall her symptoms have been manageable with the use of prednisone .     Activities of Daily Living:  Patient reports joint stiffness all day  Patient Reports nocturnal pain.  Difficulty dressing/grooming: Reports Difficulty climbing stairs: Reports Difficulty getting out of chair: Reports Difficulty using hands for taps, buttons, cutlery, and/or writing: Reports  Review of Systems  Constitutional:  Positive for fatigue.  HENT:  Positive for mouth sores and mouth dryness.   Eyes:  Positive for dryness.  Respiratory:  Negative for shortness of breath.   Cardiovascular:  Negative for chest pain and palpitations.  Gastrointestinal:  Positive for  constipation. Negative for blood in stool and diarrhea.  Endocrine: Negative for increased urination.  Genitourinary:  Negative for involuntary urination.  Musculoskeletal:  Positive for joint pain, gait problem, joint pain, joint swelling, myalgias, muscle weakness, morning stiffness, muscle tenderness and myalgias.  Skin:  Positive for color change, rash, hair loss and sensitivity to sunlight.  Allergic/Immunologic: Positive for susceptible to infections.  Neurological:  Negative for dizziness and headaches.  Hematological:  Negative for swollen glands.  Psychiatric/Behavioral:  Positive for sleep disturbance. Negative for depressed mood. The patient is not nervous/anxious.     PMFS History:  Patient Active Problem List   Diagnosis Date Noted   Urinary incontinence due to immobility 04/10/2024   Oral herpes simplex infection 04/08/2024   Excessive cerumen in both ear canals 04/08/2024   Bilateral hearing loss 04/08/2024   Sleep difficulties 04/08/2024   Acute anemia 04/05/2024   Severe malnutrition 04/05/2024   Pancytopenia (HCC) 03/22/2024   Rash 03/10/2024   Drug reaction 03/10/2024   Peeling skin 03/10/2024   HOH (hard of hearing)    Foley catheter in place 02/03/2024   Prosthetic hip infection 01/25/2024   Pseudomonas aeruginosa infection 01/25/2024   Hip dislocation, right (HCC) 01/19/2024   Chronic pain 01/19/2024   Anemia of chronic disease 12/31/2023   Multiple skin tears 12/31/2023   Debility 12/08/2023   History of revision of total replacement of right hip joint 12/08/2023   Postoperative wound infection 12/08/2023   Closed fracture of right  elbow 12/08/2023   Wound dehiscence 11/27/2023   Macrocytic anemia 11/27/2023   Lightheadedness 11/10/2023   Physical deconditioning 11/10/2023   Parotid swelling 11/10/2023   Hip fracture (HCC) 11/10/2023   Pressure sore on buttocks 10/03/2023   Hyponatremia 10/02/2023   Fall at home, initial encounter 10/02/2023    Cervical spondylosis with myelopathy and radiculopathy 04/23/2022   Constipation 03/04/2022   Upper back pain on left side 03/04/2022   Lumbar vertebral fracture, pathologic 09/20/2021   Degenerative spondylolisthesis 08/19/2021   Senile purpura 12/19/2020   Restless leg 02/02/2020   Bilateral leg edema 01/11/2020   Cervical radiculopathy at C8 06/28/2019   Carpal tunnel syndrome on both sides 06/28/2019   Right sided sciatica 04/14/2018   Trochanteric bursitis of right hip 04/14/2018   H/O cervical spine surgery 11/10/2017   Dysphagia 10/26/2017   GERD (gastroesophageal reflux disease) 08/06/2017   Easy bruising 08/06/2017   Mid back pain 03/26/2017   High risk medication use 09/02/2016   Piriformis muscle pain 09/02/2016   Right hip pain 04/08/2016   Right knee pain 02/25/2016   Numbness and tingling in left hand 11/14/2014   Biceps tendon tear 10/10/2014   Insomnia 10/10/2014   OP (osteoporosis) 01/18/2014   Other long term (current) drug therapy 01/18/2014   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 01/01/2013   History of colonic polyps 07/03/2010   Nocturia 05/28/2010   THYROID  NODULE 05/17/2008   Multiple sclerosis 05/17/2008   Other fatigue 03/30/2007   Hyperlipidemia 08/20/2006   Essential hypertension 08/20/2006   Rheumatoid arthritis (HCC) 08/20/2006   Osteoarthritis 08/20/2006   CERVICAL CANCER, HX OF 08/20/2006   GASTRIC ULCER, HX OF 08/20/2006    Past Medical History:  Diagnosis Date   Anxiety    takes Valium  daily as needed   Basal cell carcinoma 01/21/1988   left nostril (MOHS), sup-right calf (CX35FU)   Basal cell carcinoma 03/22/1996   right calf (CX35FU)   Basal cell carcinoma 03/31/1995   left wing nose   Bruises easily    d/t meds   Cancer (HCC)    basal cell ca, in situ- uterine    Cataracts, bilateral    removed bilateral   Chronic back pain    Dizziness    r/t to meds   GERD (gastroesophageal reflux disease)    no meds on a regular  basis but will take Tums if needed   Headache(784.0)    r/t neck issues   History of bronchitis 6-47yrs ago   History of colon polyps    benign   HOH (hard of hearing)    wears hearing aids   HOH (hard of hearing)    wears hearing aids    Hyperlipidemia    takes Atorvastatin  on Mondays and Fridays   Hypertension    Joint pain    Joint swelling    Multiple sclerosis    Neuromuscular disorder (HCC)    Dr. FABIENE Crete- [facility], follows [facility]   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   Rheumatoid arthritis (HCC)    Dr Lorrayne weekly, RA- hands- knees- feet    Rheumatoid arthritis(714.0)    Dr Dolphus takes Xeljanz  daily   Right wrist fracture    Skin cancer    Squamous cell carcinoma of skin 11/02/2001   in situ-right knee (cx54fu)   Squamous cell carcinoma  of skin 11/12/2011   in situ-left forearm (CX35FU), in situ-left foot (CX35FU)   Squamous cell carcinoma of skin 01/22/2012   in situ-left lower forearm (txpbx)   Squamous cell carcinoma of skin 11/17/2012   right shin (txpbx)   Squamous cell carcinoma of skin 10/28/2013   in situ-right shoulder (CX35FU)   Squamous cell carcinoma of skin 04/28/2014   well diff-right shin (txpbx)   Squamous cell carcinoma of skin 11/02/2014   in situ-Left shin (txpbx)   Squamous cell carcinoma of skin 12/15/2014   in situ-Left hand (txpbx), bowens-left side chest (txpbx)   Squamous cell carcinoma of skin 05/09/2015   in situ-left hand (txpbx)    Squamous cell carcinoma of skin 05/07/2016   in situ-left inner shin,ant, in situ-left inner shin, post, in situ-left outer forearm, in situ-right knuckle   Squamous cell carcinoma of skin 03/1302018   in situ-left shoulder (txpbx), in situ-right inner shin (txpbx), in situ-top of left foot (txpbx), KA- left forearm (txpbx)    Squamous cell carcinoma of skin 12/02/2016   in situ-left inner shin (txpbx)   Squamous cell carcinoma of skin 03/04/2017   in situ-left outer sup, shin (txpbx), in situ- right 2nd knuckle finger (txpbx), in situ- Left wrist (txpbx)   Squamous cell carcinoma of skin 04/06/2018   in situ-above left knee inner (txpbx)   Squamous cell carcinoma of skin 04/21/2018   in situ-right top hand (txpbx)   Squamous cell carcinoma of skin 07/08/2018   in situ-right lower inner shin (txpbx)   Squamous cell carcinoma of skin 04/27/2019   in situ- Left neck(CX35FU), In situ- right neck (CX35FU)   Urinary retention    sees Dr.Wrenn about 2 times a yr    Family History  Problem Relation Age of Onset   Cancer Mother        pancreatic   Diabetes Mother    Pancreatic cancer Mother    Heart disease Father        Rheumatic   Cancer Father        ? stomach   Stomach cancer Father    Cancer Sister        stomach   Stomach cancer Sister    Cancer Maternal Aunt        X 4; ? primary   Heart disease Paternal Aunt    Cancer Maternal Grandmother        cervical   Colon cancer Other        Aunts   Multiple sclerosis Daughter    Hypertension Neg Hx    Stroke Neg Hx    Esophageal cancer Neg Hx    Rectal cancer Neg Hx    Past Surgical History:  Procedure Laterality Date   ABDOMINAL HYSTERECTOMY     1985   ANTERIOR CERVICAL DECOMP/DISCECTOMY FUSION N/A 04/23/2022   Procedure: Anterior Cervical Decompression/Discectomy Fusion - Cervical Seven-Thoracic One;  Surgeon: Louis Shove, MD;  Location: [facility];  Service: Neurosurgery;  Laterality: N/A;  3C   ANTERIOR HIP REVISION Right 01/21/2024   Procedure: REVISION, TOTAL ARTHROPLASTY, HIP, ANTERIOR APPROACH;  Surgeon: Fidel Rogue, MD;  Location: [facility];  Service: Orthopedics;  Laterality: Right;   APPENDECTOMY     with TAH   BACK SURGERY     several   BREAST BIOPSY Right    Results were negative   BROW LIFT Bilateral 10/02/2016   Procedure:  BILATERAL LOWER LID BLEPHAROPLASTY;  Surgeon: Laurie Loyd Redhead, MD;  Location: [facility];  Service:  Plastics;  Laterality: Bilateral;   CARPAL TUNNEL RELEASE Right    cataracts     CERVICAL FUSION     Dr Leeann   CERVICAL FUSION  12/31/2012   Dr Leeann   CERVICAL FUSION  04/2022   CHEST TUBE INSERTION     for traumatic Pneumothorax   COLONOSCOPY  07/16/2010   normal    COLONOSCOPY W/ POLYPECTOMY  1997   negative since; Dr Debrah   ESOPHAGOGASTRODUODENOSCOPY  07/16/2010   normal   eye lid raise     EYE SURGERY Bilateral    cataracts removed - /w IOL   HARDWARE REMOVAL  08/2021   HIP CLOSED REDUCTION Right 11/16/2023   Procedure: CLOSED MANIPULATION, JOINT, HIP;  Surgeon: Ernie Cough, MD;  Location: [facility];  Service: Orthopedics;  Laterality: Right;   HIP CLOSED REDUCTION N/A 11/30/2023   Procedure: CLOSED REDUCTION, HIP;  Surgeon: Fidel Rogue, MD;  Location: [facility];  Service: Orthopedics;  Laterality: N/A;   INCISION AND DRAINAGE OF WOUND Right 11/28/2023   Procedure: IRRIGATION AND DEBRIDEMENT WOUND; REMOVAL OF HARDWARE;  Surgeon: Edna Toribio LABOR, MD;  Location: [facility];  Service: Orthopedics;  Laterality: Right;  I & D RIGHT ELBOW   JOINT REPLACEMENT     LUMBAR FUSION     Dr Leeann   NASAL SINUS SURGERY     ORIF ELBOW FRACTURE Right 11/20/2023   Procedure: OPEN REDUCTION INTERNAL FIXATION (ORIF) ELBOW/OLECRANON FRACTURE;  Surgeon: [name] Franky SQUIBB, MD;  Location: [facility];  Service: Orthopedics;  Laterality: Right;   POSTERIOR CERVICAL FUSION/FORAMINOTOMY N/A 12/31/2012   Procedure: POSTERIOR LATERAL CERVICAL FUSION/FORAMINOTOMY LEVEL 1 CERVICAL THREE-FOUR WITH LATERAL MASS SCREWS;  Surgeon: Catalina CHRISTELLA Leeann, MD;  Location: [facility];  Service: Neurosurgery;  Laterality: N/A;   PTOSIS REPAIR Bilateral 10/02/2016   Procedure: INTERNAL PTOSIS REPAIR;  Surgeon: Laurie Loyd Redhead, MD;  Location: [facility];  Service: Plastics;  Laterality: Bilateral;   SKIN BIOPSY     THYROID  SURGERY      R lobe removed- 1972, has grown back - CT last done- 2018   TOTAL HIP ARTHROPLASTY  12/22/2011   Procedure: TOTAL HIP ARTHROPLASTY;  Surgeon: Dempsey JINNY Sensor, MD;  Location: [facility];  Service: Orthopedics;  Laterality: Left;   TOTAL HIP ARTHROPLASTY Right 11/12/2023   Procedure: ARTHROPLASTY, HIP, TOTAL, ANTERIOR APPROACH;  Surgeon: Fidel Rogue, MD;  Location: [facility];  Service: Orthopedics;  Laterality: Right;   TOTAL HIP REVISION Right 12/03/2023   Procedure: TOTAL HIP REVISION;  Surgeon: Fidel Rogue, MD;  Location: [facility];  Service: Orthopedics;  Laterality: Right;  Right acetabular revision Anterior approach   TOTAL SHOULDER ARTHROPLASTY     TUBAL LIGATION     UPPER GASTROINTESTINAL ENDOSCOPY  2012   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   WRIST SURGERY Left 09/16/2022   wrist/arm   Social History   Tobacco Use   Smoking status: Former    Current packs/day: 0.00    Average packs/day: 2.5 packs/day for 20.0 years (50.0 ttl pk-yrs)    Types: Cigarettes    Start date: 05/26/1961    Quit date: 05/26/1981    Years since quitting: 42.9    Passive exposure: Never   Smokeless tobacco: Never   Tobacco comments:    Smoked [phone], up to 2.5 ppd  Vaping Use   Vaping status: Never Used  Substance Use Topics   Alcohol  use: Yes    Alcohol /week: 14.0 standard drinks of alcohol     Types:  14 Cans of beer per week    Comment: 2% beer   Drug use: Never   Social History   Social History Narrative   Not on file     Immunization History  Administered Date(s) Administered   Fluad Quad(high Dose 65+) 02/02/2020, 02/01/2022   INFLUENZA, HIGH DOSE SEASONAL PF 02/04/2015, 02/18/2017, 02/21/2018, 01/16/2019, 01/25/2021, 02/02/2023   Influenza Whole 02/22/2007, 02/08/2013   Influenza-Unspecified 01/24/2014, 01/25/2016, 02/18/2017, 01/16/2019, 01/25/2020   Moderna Covid-19 Fall Seasonal Vaccine 23yrs & older 02/02/2023, 09/07/2023   Moderna Covid-19 Vaccine Bivalent Booster 6yrs & up  02/11/2022   PFIZER(Purple Top)SARS-COV-2 Vaccination 06/18/2019, 07/09/2019, 01/09/2020, 07/03/2020, 02/07/2021   PNEUMOCOCCAL CONJUGATE-20 02/26/2022   Pfizer Covid-19 Vaccine Bivalent Booster 60yrs & up 02/08/2021   Pneumococcal Conjugate-13 11/23/2012, 09/08/2014   Pneumococcal Polysaccharide-23 05/26/2006   Pneumococcal-Unspecified 12/28/2012, 06/09/2020   Respiratory Syncytial Virus Vaccine,Recomb Aduvanted(Arexvy) 01/23/2022   Td 05/28/2010   Tdap 12/24/2020, 01/07/2021   Unspecified SARS-COV-2 Vaccination 06/26/2020, 02/11/2022   Varicella 11/07/2020   Zoster Recombinant(Shingrix) 02/10/2019, 04/14/2019   Zoster, Live 05/26/2009, 07/10/2020     Objective: Vital Signs: BP 110/69   Pulse 99   Temp 97.7 F (36.5 C)   Resp 14   Ht 5' (1.524 m)   BMI 17.97 kg/m    Physical Exam Vitals and nursing note reviewed.  Constitutional:      Appearance: She is well-developed.  HENT:     Head: Normocephalic and atraumatic.  Eyes:     Conjunctiva/sclera: Conjunctivae normal.  Cardiovascular:     Rate and Rhythm: Normal rate and regular rhythm.     Heart sounds: Normal heart sounds.  Pulmonary:     Effort: Pulmonary effort is normal.     Breath sounds: Normal breath sounds.  Abdominal:     General: Bowel sounds are normal.     Palpations: Abdomen is soft.  Musculoskeletal:     Cervical back: Normal range of motion.  Lymphadenopathy:     Cervical: No cervical adenopathy.  Skin:    General: Skin is warm and dry.     Capillary Refill: Capillary refill takes less than 2 seconds.  Neurological:     Mental Status: She is alert and oriented to person, place, and time.  Psychiatric:        Behavior: Behavior normal.      Musculoskeletal Exam: Patient remained in the wheelchair during examination today. C-spine has limited ROM.  Thickening of all MCP joints but no synovitis noted. Ulnar deviation noted.  PIP and DIP thickening and limited flexion and extension. Unable to assess  hip ROM.   No warmth or effusion of knee joints. Pedal edema bilateral LE.  No tenderness of ankle joints at this time.   CDAI Exam: CDAI Score: -- Patient Global: --; Provider Global: -- Swollen: --; Tender: -- Joint Exam 04/25/2024   No joint exam has been documented for this visit   There is currently no information documented on the homunculus. Go to the Rheumatology activity and complete the homunculus joint exam.  Investigation: No additional findings.  Imaging: No results found.  Recent Labs: Lab Results  Component Value Date   WBC 2.3 (L) 03/29/2024   HGB 9.2 (L) 03/29/2024   PLT 274 03/29/2024   NA 135 03/29/2024   K 3.8 03/29/2024   CL 104 03/29/2024   CO2 24 03/29/2024   GLUCOSE 80 03/29/2024   BUN <5 (L) 03/29/2024   CREATININE 0.37 (L) 03/29/2024   BILITOT 0.4 03/29/2024   ALKPHOS 70  03/29/2024   AST 40 03/29/2024   ALT 8 03/29/2024   PROT 5.0 (L) 03/29/2024   ALBUMIN  2.5 (L) 03/29/2024   CALCIUM  7.7 (L) 03/29/2024   GFRAA 65 09/26/2020   QFTBGOLDPLUS NEGATIVE 11/03/2022    Speciality Comments: Prior therapy includes: Xeljanz  (cost), Olumiant -inadequate response, Imuran weakness, leflunomide-intolerance Prolia : 05/04/18, 11/12/18, 06/09/19, 12/07/19, 07/05/20, 01/01/21, 07/09/21, 01/09/22, 07/24/22  Procedures:  No procedures performed Allergies: Ciprofloxacin , Darvon [propoxyphene hcl], Demerol [meperidine], Penicillins, Imuran [azathioprine], Sulfa  antibiotics, Arava [leflunomide], Lipitor [atorvastatin ], and Vytorin [ezetimibe-simvastatin]   Assessment / Plan:     Visit Diagnoses: Rheumatoid arthritis involving multiple sites with positive rheumatoid factor (HCC): She has no synovitis on examination today.  She has been experiencing some stiffness in both hands but overall her symptoms of rheumatoid arthritis have been well-controlled.  She has been taking prednisone  5 mg 1 tablet by mouth daily.  She continues to tolerate prednisone  without any side effects.   She has been off of her invoke and methotrexate  since spring 2025.  Patient has had several surgeries requiring hospitalizations as well as recurrent infections/delayed healing.  Patient has been bedridden and has required 24-hour care since transitioning home.  Patient will be starting physical therapy twice a week starting this week.  Patient continues to have total body pain and stiffness but does not have any signs of active rheumatoid arthritis currently.  Do not plan to resume methotrexate  or rinvoq  at this time.  She will remain on low-dose prednisone  5 mg 1 tablet daily with close monitoring. She will follow up in 3 months or sooner if needed.    High risk medication use -Prednisone  5 mg 1 tablet by mouth daily. CBC and CMP updated on 03/29/24 Lipid panel updated on 08/06/23  She has been off of [name]  and methotrexate  since early spring 2025-several surgeries/hospitalizations, and recurrent infections.   Long term (current) use of systemic steroids: Patient remains on prednisone  5 mg 1 tablet by mouth daily.  She is aware of the risks of long-term prednisone  use. Hgb A1c 6.0% on 08/06/23  Pain in both hands: PIP and DIP thickening.  Limited extension of PIP and DIP joints.  Synovial thickening on radiation of all MCP joints.  No synovitis noted.   Ganglion cyst of volar aspect of right wrist  History of revision of total replacement of right hip joint: Patient had a closed fracture of the right hip requiring surgical intervention on 11/12/2023.  Required a revision x 2 under the care of Dr. Fidel.  She remains wheelchair bound.  She will be starting home PT this week.    History of total hip replacement, left: Dr. Liam: Currently in a wheelchair-unable to ambulate at this time.  She will be starting home therapy this week.   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 - - C7-T1 anterior discectomy and fusion 2023 by Dr. Malcolm.  She is better range of motion.  Degeneration of  intervertebral disc of lumbar region without discogenic back pain or lower extremity pain - History of surgery x 2 by Dr. Malcolm in 2023.  She continues to have lower back pain despite having surgery.  Closed fracture of left wrist with routine healing, subsequent encounter  Closed fracture of right wrist with routine healing, subsequent encounter  Age-related osteoporosis without current pathological fracture - DEXA 04/14/22: RFN T-score -3.2. Current treatment-prolia  60 mg sq injections every 6 months- last prolia  injection: 02/05/2023. taking calcium  and vitamin D .  Vitamin D  deficiency  Other medical conditions are listed  as follows:  History of hyperlipidemia  History of hypertension: Blood pressure was 110/69 today in the office.  Restless leg  Gastroesophageal reflux disease without esophagitis  History of multiple sclerosis  History of depression  History of anxiety  Primary insomnia  Other fatigue  Chronic right SI joint pain  History of cellulitis  Orders: No orders of the defined types were placed in this encounter.  Meds ordered this encounter  Medications   predniSONE  (DELTASONE ) 5 MG tablet    Sig: Take 1 tablet (5 mg total) by mouth daily with breakfast.    Dispense:  90 tablet    Refill:  0     Follow-Up Instructions: Return for Rheumatoid arthritis.   Waddell CHRISTELLA Craze, PA-C  Note - This record has been created using Dragon software.  Chart creation errors have been sought, but may not always  have been located. Such creation errors do not reflect on  the standard of medical care.

## 2024-04-25 ENCOUNTER — Ambulatory Visit: Attending: Physician Assistant | Admitting: Physician Assistant

## 2024-04-25 ENCOUNTER — Encounter: Payer: Self-pay | Admitting: Physician Assistant

## 2024-04-25 VITALS — BP 110/69 | HR 99 | Temp 97.7°F | Resp 14 | Ht 60.0 in

## 2024-04-25 DIAGNOSIS — M810 Age-related osteoporosis without current pathological fracture: Secondary | ICD-10-CM

## 2024-04-25 DIAGNOSIS — S62101D Fracture of unspecified carpal bone, right wrist, subsequent encounter for fracture with routine healing: Secondary | ICD-10-CM

## 2024-04-25 DIAGNOSIS — M51369 Other intervertebral disc degeneration, lumbar region without mention of lumbar back pain or lower extremity pain: Secondary | ICD-10-CM

## 2024-04-25 DIAGNOSIS — Z79899 Other long term (current) drug therapy: Secondary | ICD-10-CM | POA: Diagnosis not present

## 2024-04-25 DIAGNOSIS — E559 Vitamin D deficiency, unspecified: Secondary | ICD-10-CM

## 2024-04-25 DIAGNOSIS — M0579 Rheumatoid arthritis with rheumatoid factor of multiple sites without organ or systems involvement: Secondary | ICD-10-CM

## 2024-04-25 DIAGNOSIS — M533 Sacrococcygeal disorders, not elsewhere classified: Secondary | ICD-10-CM

## 2024-04-25 DIAGNOSIS — Z8679 Personal history of other diseases of the circulatory system: Secondary | ICD-10-CM

## 2024-04-25 DIAGNOSIS — K219 Gastro-esophageal reflux disease without esophagitis: Secondary | ICD-10-CM

## 2024-04-25 DIAGNOSIS — S62102D Fracture of unspecified carpal bone, left wrist, subsequent encounter for fracture with routine healing: Secondary | ICD-10-CM

## 2024-04-25 DIAGNOSIS — M4312 Spondylolisthesis, cervical region: Secondary | ICD-10-CM

## 2024-04-25 DIAGNOSIS — Z7952 Long term (current) use of systemic steroids: Secondary | ICD-10-CM

## 2024-04-25 DIAGNOSIS — Z872 Personal history of diseases of the skin and subcutaneous tissue: Secondary | ICD-10-CM

## 2024-04-25 DIAGNOSIS — G35D Multiple sclerosis, unspecified: Secondary | ICD-10-CM

## 2024-04-25 DIAGNOSIS — F5101 Primary insomnia: Secondary | ICD-10-CM

## 2024-04-25 DIAGNOSIS — G8929 Other chronic pain: Secondary | ICD-10-CM

## 2024-04-25 DIAGNOSIS — M79641 Pain in right hand: Secondary | ICD-10-CM

## 2024-04-25 DIAGNOSIS — Z8639 Personal history of other endocrine, nutritional and metabolic disease: Secondary | ICD-10-CM

## 2024-04-25 DIAGNOSIS — Z96641 Presence of right artificial hip joint: Secondary | ICD-10-CM

## 2024-04-25 DIAGNOSIS — R5383 Other fatigue: Secondary | ICD-10-CM

## 2024-04-25 DIAGNOSIS — G2581 Restless legs syndrome: Secondary | ICD-10-CM

## 2024-04-25 DIAGNOSIS — Z96642 Presence of left artificial hip joint: Secondary | ICD-10-CM

## 2024-04-25 DIAGNOSIS — M79642 Pain in left hand: Secondary | ICD-10-CM

## 2024-04-25 DIAGNOSIS — M67431 Ganglion, right wrist: Secondary | ICD-10-CM

## 2024-04-25 DIAGNOSIS — Z8659 Personal history of other mental and behavioral disorders: Secondary | ICD-10-CM

## 2024-04-25 DIAGNOSIS — M1611 Unilateral primary osteoarthritis, right hip: Secondary | ICD-10-CM

## 2024-04-25 MED ORDER — PREDNISONE 5 MG PO TABS: 5.0000 mg | ORAL_TABLET | Freq: Every day | ORAL | 0 refills | Status: AC

## 2024-04-25 NOTE — Telephone Encounter (Signed)
 I would continue because of all the antibiotic she has been on to keep her gut flora balanced

## 2024-04-25 NOTE — Telephone Encounter (Signed)
 Message left for Kim today.

## 2024-04-26 ENCOUNTER — Other Ambulatory Visit: Payer: Self-pay | Admitting: Internal Medicine

## 2024-04-26 NOTE — Telephone Encounter (Unsigned)
 Copied from CRM [id number]. Topic: Clinical - Medication Refill >> Apr 26, 2024 10:48 AM Berneda FALCON wrote: Medication:  diazepam  (VALIUM ) 5 MG tablet   Has the patient contacted their pharmacy? No (Agent: If no, request that the patient contact the pharmacy for the refill. If patient does not wish to contact the pharmacy document the reason why and proceed with request.) (Agent: If yes, when and what did the pharmacy advise?)  This is the patient's preferred pharmacy:  [id number] [place], [place] - [address] [place] [address] Phone: [phone] Fax: [phone]  Is this the correct pharmacy for this prescription? Yes If no, delete pharmacy and type the correct one.   Has the prescription been filled recently? Yes  Is the patient out of the medication? Yes  Has the patient been seen for an appointment in the last year OR does the patient have an upcoming appointment? Yes  Can we respond through MyChart? Yes  Agent: Please be advised that Rx refills may take up to 3 business days. We ask that you follow-up with your pharmacy.

## 2024-04-27 MED ORDER — DIAZEPAM 5 MG PO TABS: 5.0000 mg | ORAL_TABLET | Freq: Every evening | ORAL | 5 refills | Status: DC | PRN

## 2024-05-02 ENCOUNTER — Other Ambulatory Visit: Payer: Self-pay | Admitting: Internal Medicine

## 2024-05-04 ENCOUNTER — Other Ambulatory Visit: Payer: Self-pay | Admitting: Internal Medicine

## 2024-05-04 NOTE — Progress Notes (Signed)
 Subjective:  Chief Complaint: follow-up prosthetic joint infection   Patient ID: Sherri [name], female    DOB: [birth year], 83 y.o.   MRN: [id number]  HPI  Discussed the use of AI scribe software for clinical note transcription with the patient, who gave verbal consent to proceed.  History of Present Illness   Sherri [name] is an 83 year old female with a septic hip infection who presents for follow-up of her hip infection and antibiotic management.  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 She is currently on levofloxacin , which she appears to be tolerating.  Her hip is doing fine without pain in the hip itself, but she experiences pain in her buttocks. A sore in that area had cleared up but is starting to develop again. She has recently been given permission to start physical therapy and has been walking around the house.  Her rheumatoid arthritis has not been bothersome, and she is currently on prednisone . She was previously on [name]  but is not taking it now. She has not been on methotrexate .  Her incision is still draining and requires packing. 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       Past Medical History:  Diagnosis Date   Anxiety    takes Valium  daily as needed   Basal cell carcinoma 01/21/1988   left nostril (MOHS), sup-right calf (CX35FU)   Basal cell carcinoma 03/22/1996   right calf (CX35FU)   Basal cell carcinoma 03/31/1995   left wing nose   Bruises easily    d/t meds   Cancer (HCC)    basal cell ca, in situ- uterine     Cataracts, bilateral    removed bilateral   Chronic back pain    Dizziness    r/t to meds   GERD (gastroesophageal reflux disease)    no meds on a regular basis but will take Tums if needed   Headache(784.0)    r/t neck issues   History of bronchitis 6-49yrs ago   History of colon polyps    benign   HOH (hard of hearing)    wears hearing aids   HOH (hard of hearing)    wears hearing aids    Hyperlipidemia    takes Atorvastatin  on Mondays and Fridays   Hypertension    Joint pain    Joint swelling    Multiple sclerosis    Neuromuscular disorder (HCC)    Dr. FABIENE Crete- [facility], follows [facility].   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   Rheumatoid arthritis (HCC)    Dr Lorrayne weekly, RA- hands- knees- feet    Rheumatoid arthritis(714.0)    Dr Dolphus takes Xeljanz  daily   Right wrist fracture    Skin cancer    Squamous cell carcinoma of skin 11/02/2001   in situ-right knee (cx93fu)   Squamous cell carcinoma of skin 11/12/2011   in situ-left forearm (CX35FU), in situ-left foot (CX35FU)   Squamous cell  carcinoma of skin 01/22/2012   in situ-left lower forearm (txpbx)   Squamous cell carcinoma of skin 11/17/2012   right shin (txpbx)   Squamous cell carcinoma of skin 10/28/2013   in situ-right shoulder (CX35FU)   Squamous cell carcinoma of skin 04/28/2014   well diff-right shin (txpbx)   Squamous cell carcinoma of skin 11/02/2014   in situ-Left shin (txpbx)   Squamous cell carcinoma of skin 12/15/2014   in situ-Left hand (txpbx), bowens-left side chest (txpbx)   Squamous cell carcinoma of skin 05/09/2015   in situ-left hand (txpbx)    Squamous cell carcinoma of skin 05/07/2016   in situ-left inner shin,ant, in situ-left inner shin, post, in situ-left outer forearm, in situ-right knuckle   Squamous cell  carcinoma of skin 03/1302018   in situ-left shoulder (txpbx), in situ-right inner shin (txpbx), in situ-top of left foot (txpbx), KA- left forearm (txpbx)   Squamous cell carcinoma of skin 12/02/2016   in situ-left inner shin (txpbx)   Squamous cell carcinoma of skin 03/04/2017   in situ-left outer sup, shin (txpbx), in situ- right 2nd knuckle finger (txpbx), in situ- Left wrist (txpbx)   Squamous cell carcinoma of skin 04/06/2018   in situ-above left knee inner (txpbx)   Squamous cell carcinoma of skin 04/21/2018   in situ-right top hand (txpbx)   Squamous cell carcinoma of skin 07/08/2018   in situ-right lower inner shin (txpbx)   Squamous cell carcinoma of skin 04/27/2019   in situ- Left neck(CX35FU), In situ- right neck (CX35FU)   Urinary retention    sees Dr.Wrenn about 2 times a yr    Past Surgical History:  Procedure Laterality Date   ABDOMINAL HYSTERECTOMY     1985   ANTERIOR CERVICAL DECOMP/DISCECTOMY FUSION N/A 04/23/2022   Procedure: Anterior Cervical Decompression/Discectomy Fusion - Cervical Seven-Thoracic One;  Surgeon: Louis Shove, MD;  Location: [facility];  Service: Neurosurgery;  Laterality: N/A;  3C   ANTERIOR HIP REVISION Right 01/21/2024   Procedure: REVISION, TOTAL ARTHROPLASTY, HIP, ANTERIOR APPROACH;  Surgeon: Fidel Rogue, MD;  Location: [facility];  Service: Orthopedics;  Laterality: Right;   APPENDECTOMY     with TAH   BACK SURGERY     several   BREAST BIOPSY Right    Results were negative   BROW LIFT Bilateral 10/02/2016   Procedure: BILATERAL LOWER LID BLEPHAROPLASTY;  Surgeon: Laurie Loyd Redhead, MD;  Location: [facility];  Service: Plastics;  Laterality: Bilateral;   CARPAL TUNNEL RELEASE Right    cataracts     CERVICAL FUSION     Dr Leeann   CERVICAL FUSION  12/31/2012   Dr Leeann   CERVICAL FUSION  04/2022   CHEST TUBE INSERTION     for traumatic Pneumothorax   COLONOSCOPY  07/16/2010   normal    COLONOSCOPY W/ POLYPECTOMY  1997   negative since; Dr  Debrah   ESOPHAGOGASTRODUODENOSCOPY  07/16/2010   normal   eye lid raise     EYE SURGERY Bilateral    cataracts removed - /w IOL   HARDWARE REMOVAL  08/2021   HIP CLOSED REDUCTION Right 11/16/2023   Procedure: CLOSED MANIPULATION, JOINT, HIP;  Surgeon: Ernie Cough, MD;  [facility]: [facility];  Service: Orthopedics;  Laterality: Right;   HIP CLOSED REDUCTION N/A 11/30/2023   Procedure: CLOSED REDUCTION, HIP;  Surgeon: Fidel Rogue, MD;  [facility]: [facility];  Service: Orthopedics;  Laterality: N/A;   INCISION AND DRAINAGE OF WOUND Right 11/28/2023   Procedure: IRRIGATION AND  DEBRIDEMENT WOUND; REMOVAL OF HARDWARE;  Surgeon: Edna Toribio LABOR, MD;  Location: [facility];  Service: Orthopedics;  Laterality: Right;  I & D RIGHT ELBOW   JOINT REPLACEMENT     LUMBAR FUSION     Dr Leeann   NASAL SINUS SURGERY     ORIF ELBOW FRACTURE Right 11/20/2023   Procedure: OPEN REDUCTION INTERNAL FIXATION (ORIF) ELBOW/OLECRANON FRACTURE;  Surgeon: [name] Franky SQUIBB, MD;  [facility];  Service: Orthopedics;  Laterality: Right;   POSTERIOR CERVICAL FUSION/FORAMINOTOMY N/A 12/31/2012   Procedure: POSTERIOR LATERAL CERVICAL FUSION/FORAMINOTOMY LEVEL 1 CERVICAL THREE-FOUR WITH LATERAL MASS SCREWS;  Surgeon: Catalina CHRISTELLA Leeann, MD;  [facility]: [facility];  Service: Neurosurgery;  Laterality: N/A;   PTOSIS REPAIR Bilateral 10/02/2016   Procedure: INTERNAL PTOSIS REPAIR;  Surgeon: Laurie Loyd Redhead, MD;  [facility]: [facility];  Service: Plastics;  Laterality: Bilateral;   SKIN BIOPSY     THYROID  SURGERY     R lobe removed- 1972, has grown back - CT last done- 2018   TOTAL HIP ARTHROPLASTY  12/22/2011   Procedure: TOTAL HIP ARTHROPLASTY;  Surgeon: Dempsey JINNY Sensor, MD;  Location: [facility];  Service: Orthopedics;  Laterality: Left;   TOTAL HIP ARTHROPLASTY Right 11/12/2023   Procedure: ARTHROPLASTY, HIP, TOTAL, ANTERIOR APPROACH;  Surgeon: Fidel Rogue, MD;  [facility]: [facility];  Service: Orthopedics;  Laterality: Right;   TOTAL HIP  REVISION Right 12/03/2023   Procedure: TOTAL HIP REVISION;  Surgeon: Fidel Rogue, MD;  [facility]: [facility];  Service: Orthopedics;  Laterality: Right;  Right acetabular revision Anterior approach   TOTAL SHOULDER ARTHROPLASTY     TUBAL LIGATION     UPPER GASTROINTESTINAL ENDOSCOPY  2012   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   WRIST SURGERY Left 09/16/2022   wrist/arm    Family History  Problem Relation Age of Onset   Cancer Mother        pancreatic   Diabetes Mother    Pancreatic cancer Mother    Heart disease Father        Rheumatic   Cancer Father        ? stomach   Stomach cancer Father    Cancer Sister        stomach   Stomach cancer Sister    Cancer Maternal Aunt        X 4; ? primary   Heart disease Paternal Aunt    Cancer Maternal Grandmother        cervical   Colon cancer Other        Aunts   Multiple sclerosis Daughter    Hypertension Neg Hx    Stroke Neg Hx    Esophageal cancer Neg Hx    Rectal cancer Neg Hx       Social History   Socioeconomic History   Marital status: Married    Spouse name: Cheryl   Number of children: 2   Years of education: Not on file   Highest education level: Not on file  Occupational History   Occupation: retired  Tobacco Use   Smoking status: Former    Current packs/day: 0.00    Average packs/day: 2.5 packs/day for 20.0 years (50.0 ttl pk-yrs)    Types: Cigarettes    Start date: 05/26/1961    Quit date: 05/26/1981    Years since quitting: 42.9    Passive exposure: Never   Smokeless tobacco: Never   Tobacco comments:    Smoked [phone], up to 2.5 ppd  Vaping Use   Vaping status: Never Used  Substance and Sexual Activity   Alcohol  use: Yes    Alcohol /week: 14.0 standard drinks of alcohol     Types: 14 Cans of beer per week    Comment: 2% beer   Drug use: Never   Sexual activity: Yes    Birth control/protection: Surgical    Comment: Hysterectomy  Other Topics Concern   Not on file  Social History Narrative   Not  on file   Social Drivers of Health   Financial Resource Strain: Low Risk  (02/21/2022)   Overall Financial Resource Strain (CARDIA)    Difficulty of Paying Living Expenses: Not hard at all  Food Insecurity: No Food Insecurity (03/30/2024)   Hunger Vital Sign    Worried About Running Out of Food in the Last Year: Never true    Ran Out of Food in the Last Year: Never true  Transportation Needs: No Transportation Needs (03/30/2024)   PRAPARE - Administrator, Civil Service (Medical): No    Lack of Transportation (Non-Medical): No  Physical Activity: Inactive (02/21/2022)   Exercise Vital Sign    Days of Exercise per Week: 0 days    Minutes of Exercise per Session: 0 min  Stress: Stress Concern Present (02/21/2022)   [employer] of Occupational Health - Occupational Stress Questionnaire    Feeling of Stress : Rather much  Social Connections: Socially Integrated (03/23/2024)   Social Connection and Isolation Panel    Frequency of Communication with Friends and Family: More than three times a week    Frequency of Social Gatherings with Friends and Family: More than three times a week    Attends Religious Services: 1 to 4 times per year    Active Member of [employer] or Organizations: Yes    Attends Engineer, Structural: More than 4 times per year    Marital Status: Married    Allergies  Allergen Reactions   Ciprofloxacin  Itching, Nausea Only, Other (See Comments), Rash and Dermatitis    Blistering rash and oral ulcers occurred when retried Cipro  and severe mouth pain  Made her sick- stomach hurt. (Also)  ciprofloxacin    Darvon [Propoxyphene Hcl] Shortness Of Breath and Other (See Comments)    ? Dose Related ? Lowered respirations greatly   Demerol [Meperidine] Shortness Of Breath and Other (See Comments)    Respiratory Distress   Penicillins Hives and Other (See Comments)    Has patient had a PCN reaction causing immediate rash, facial/tongue/throat swelling,  SOB or lightheadedness with hypotension: No SEVERE RASH INVOLVING MUCUS MEMBRANES or SKIN NECROSIS: #  #  #  YES  #  #  #  Has patient had a PCN reaction that required hospitalization No Has patient had a PCN reaction occurring within the last 10 years: No.    Imuran [Azathioprine] Other (See Comments)    Weakness   Sulfa  Antibiotics Nausea Only and Other (See Comments)    States had severe indigestion and heartburn and nausea and had to stop taking   Arava [Leflunomide] Other (See Comments)    Excessive weight gain   Lipitor [Atorvastatin ] Nausea And Vomiting    Currently taking 2 times weekly - able to tolerate low dose    Vytorin [Ezetimibe-Simvastatin] Nausea And Vomiting     Current Outpatient Medications:    acetaminophen  (TYLENOL ) 325 MG tablet, Take 2 tablets (650 mg total) by mouth every 6 (six) hours as needed for mild pain (pain score 1-3)  or fever (or Fever >/= 101)., Disp: 20 tablet, Rfl: 0   ascorbic acid  (VITAMIN C ) 1000 MG tablet, Take 1 tablet (1,000 mg total) by mouth daily., Disp: 100 tablet, Rfl: 0   Biotin  5000 MCG TABS, Take 10,000 mcg by mouth in the morning., Disp: , Rfl:    Carboxymethylcellul-Glycerin  (LUBRICATING EYE DROPS OP), Place 1 drop into both eyes 3 (three) times daily as needed (dry eyes)., Disp: , Rfl:    chlorhexidine  (PERIDEX ) 0.12 % solution, Use as directed 15 mLs in the mouth or throat 4 (four) times daily., Disp: 120 mL, Rfl: 0   Cholecalciferol  (VITAMIN D3) 1000 units CAPS, Take 1,000 Units by mouth daily., Disp: , Rfl:    cyclobenzaprine  (FLEXERIL ) 10 MG tablet, TAKE 1 TABLET BY MOUTH THREE TIMES A DAY AS NEEDED FOR MUSCLE SPASM (Patient taking differently: Take 10 mg by mouth 3 (three) times daily as needed for muscle spasms.), Disp: 90 tablet, Rfl: 2   denosumab  (PROLIA ) 60 MG/ML SOSY injection, Inject 60 mg into the skin every 6 (six) months. Deliver to rheum: [address], [place], [place] [address]. Appt on 07/23/2022, Disp: 1 mL,  Rfl: 0   diazepam  (VALIUM ) 5 MG tablet, Take 1 tablet (5 mg total) by mouth at bedtime as needed for anxiety. TAKE 1 TABLET BY MOUTH AT BEDTIME AS NEEDED FOR MUSCLE SPASMS., Disp: 30 tablet, Rfl: 5   DULoxetine  (CYMBALTA ) 20 MG capsule, TAKE 1 CAPSULE BY MOUTH AT BEDTIME., Disp: 90 capsule, Rfl: 1   folic acid  (FOLVITE ) 1 MG tablet, Take 1 tablet (1 mg total) by mouth daily., Disp: 90 tablet, Rfl: 3   furosemide  (LASIX ) 20 MG tablet, Take 0.5 tablets (10 mg total) by mouth daily., Disp: 30 tablet, Rfl: 0   Incontinence Supply Disposable (REALITY INCONTINENT BRIEFS SM) MISC, UAD for urinary incontinence, Disp: 200 each, Rfl: 5   Lactobacillus (ACIDOPHILUS) CAPS capsule, Take 2 capsules by mouth 3 (three) times daily., Disp: 180 capsule, Rfl: 0   levofloxacin  (LEVAQUIN ) 500 MG tablet, Take 1 tablet (500 mg total) by mouth daily., Disp: 30 tablet, Rfl: 2   lidocaine  (XYLOCAINE ) 2 % solution, Use as directed 15 mLs in the mouth or throat every 3 (three) hours as needed for mouth pain., Disp: 240 mL, Rfl: 0   magic mouthwash (nystatin , lidocaine , diphenhydrAMINE , alum & mag hydroxide) suspension, Swish and spit 5 mLs 3 (three) times daily as needed for mouth pain., Disp: 180 mL, Rfl: 3   mirtazapine  (REMERON ) 7.5 MG tablet, TAKE 1 TABLET BY MOUTH AT BEDTIME., Disp: 90 tablet, Rfl: 2   Nystatin  (GERHARDT'S BUTT CREAM) CREA, Apply 1 Application topically 3 (three) times daily. (Patient taking differently: Apply 1 Application topically See admin instructions. Apply as directed to buttocks after every cleansing/drying), Disp: 60 each, Rfl: 0   ondansetron  (ZOFRAN ) 4 MG tablet, Take 1 tablet (4 mg total) by mouth every 6 (six) hours as needed for nausea. (Patient not taking: Reported on 04/25/2024), Disp: 20 tablet, Rfl: 0   pantoprazole  (PROTONIX ) 40 MG tablet, TAKE 1 TABLET (40 MG TOTAL) BY MOUTH TWICE A DAY BEFORE MEALS, Disp: 180 tablet, Rfl: 1   phenol (CHLORASEPTIC) 1.4 % LIQD, Use as directed 1 spray in  the mouth or throat as needed for throat irritation / pain., Disp: 177 mL, Rfl: 0   polyethylene glycol powder (GLYCOLAX /MIRALAX ) 17 GM/SCOOP powder, Take 17 g by mouth daily. (Patient taking differently: Take 17 g by mouth See admin instructions. Mix 17 grams into  a cup of coffee and drink in the morning), Disp: , Rfl:    predniSONE  (DELTASONE ) 5 MG tablet, Take 1 tablet (5 mg total) by mouth daily with breakfast., Disp: 90 tablet, Rfl: 0   Zinc  Sulfate 220 (50 Zn) MG TABS, Take 1 tablet (220 mg total) by mouth daily with supper., Disp: 100 tablet, Rfl: 0   Review of Systems  Constitutional:  Negative for activity change, appetite change, chills, diaphoresis, fatigue, fever and unexpected weight change.  HENT:  Negative for congestion, rhinorrhea, sinus pressure, sneezing, sore throat and trouble swallowing.   Eyes:  Negative for photophobia and visual disturbance.  Respiratory:  Negative for cough, chest tightness, shortness of breath, wheezing and stridor.   Cardiovascular:  Negative for chest pain, palpitations and leg swelling.  Gastrointestinal:  Negative for abdominal distention, abdominal pain, anal bleeding, blood in stool, constipation, diarrhea, nausea and vomiting.  Genitourinary:  Negative for difficulty urinating, dysuria, flank pain and hematuria.  Musculoskeletal:  Negative for arthralgias, back pain, gait problem, joint swelling and myalgias.  Skin:  Negative for color change, pallor, rash and wound.  Neurological:  Positive for weakness. Negative for dizziness, tremors and light-headedness.  Hematological:  Negative for adenopathy. Does not bruise/bleed easily.  Psychiatric/Behavioral:  Negative for agitation, behavioral problems, confusion, decreased concentration, dysphoric mood and sleep disturbance.        Objective:   Physical Exam Exam conducted with a chaperone present.  Constitutional:      General: She is not in acute distress.    Appearance: Normal appearance.  She is well-developed. She is not ill-appearing or diaphoretic.  HENT:     Head: Normocephalic and atraumatic.     Right Ear: Hearing and external ear normal.     Left Ear: Hearing and external ear normal.     Nose: No nasal deformity or rhinorrhea.  Eyes:     General: No scleral icterus.    Conjunctiva/sclera: Conjunctivae normal.     Right eye: Right conjunctiva is not injected.     Left eye: Left conjunctiva is not injected.     Pupils: Pupils are equal, round, and reactive to light.  Neck:     Vascular: No JVD.  Cardiovascular:     Rate and Rhythm: Normal rate and regular rhythm.     Heart sounds: Normal heart sounds, S1 normal and S2 normal. No murmur heard.    No friction rub.  Abdominal:     General: Bowel sounds are normal. There is no distension.     Palpations: Abdomen is soft.     Tenderness: There is no abdominal tenderness.  Musculoskeletal:        General: Normal range of motion.     Right shoulder: Normal.     Left shoulder: Normal.     Cervical back: Normal range of motion and neck supple.     Right hip: Normal.     Left hip: Normal.     Right knee: Normal.     Left knee: Normal.  Lymphadenopathy:     Head:     Right side of head: No submandibular, preauricular or posterior auricular adenopathy.     Left side of head: No submandibular, preauricular or posterior auricular adenopathy.     Cervical: No cervical adenopathy.     Right cervical: No superficial or deep cervical adenopathy.    Left cervical: No superficial or deep cervical adenopathy.  Skin:    General: Skin is warm and dry.  Coloration: Skin is not pale.     Findings: No abrasion, bruising, ecchymosis, erythema, lesion or rash.     Nails: There is no clubbing.  Neurological:     Mental Status: She is alert and oriented to person, place, and time.     Sensory: No sensory deficit.     Coordination: Coordination normal.  Psychiatric:        Attention and Perception: She is attentive.         Mood and Affect: Mood normal.        Speech: Speech normal.        Behavior: Behavior normal. Behavior is cooperative.        Thought Content: Thought content normal.        Judgment: Judgment normal.      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 Levofloxacin  is well-tolerated BUT SHE WAS NOT ON TREATMENT DOSE AT 500MG    I AM SWITCHING HER TO 750MG  = TREATMENT DOSE because I do not want her to have a FQ R Ps aeruginosa PJI ---change to 750mg  levaquin  --check CBC CMp, CBC w diff, - Continue levofloxacin  indefinitely for prosthetic joint infection.  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 Mobility improved with assistance. - Continue packing the wound as per surgeon's recommendation.  RA: not terribly active and manged with steroids   Rash: resolved and  ? if it truly was cipro  related   Pancytopenia: recheck CBC w diff   weight loss; check pre-albumin  she appears to be getting stronger

## 2024-05-05 ENCOUNTER — Encounter: Payer: Self-pay | Admitting: Infectious Disease

## 2024-05-05 ENCOUNTER — Telehealth: Payer: Self-pay

## 2024-05-05 ENCOUNTER — Ambulatory Visit (INDEPENDENT_AMBULATORY_CARE_PROVIDER_SITE_OTHER): Payer: Self-pay | Admitting: Infectious Disease

## 2024-05-05 ENCOUNTER — Other Ambulatory Visit: Payer: Self-pay

## 2024-05-05 VITALS — BP 140/79 | HR 106 | Temp 97.3°F

## 2024-05-05 DIAGNOSIS — L270 Generalized skin eruption due to drugs and medicaments taken internally: Secondary | ICD-10-CM | POA: Diagnosis not present

## 2024-05-05 DIAGNOSIS — R634 Abnormal weight loss: Secondary | ICD-10-CM

## 2024-05-05 DIAGNOSIS — G35D Multiple sclerosis, unspecified: Secondary | ICD-10-CM

## 2024-05-05 DIAGNOSIS — Z96641 Presence of right artificial hip joint: Secondary | ICD-10-CM | POA: Diagnosis not present

## 2024-05-05 DIAGNOSIS — B009 Herpesviral infection, unspecified: Secondary | ICD-10-CM

## 2024-05-05 DIAGNOSIS — B965 Pseudomonas (aeruginosa) (mallei) (pseudomallei) as the cause of diseases classified elsewhere: Secondary | ICD-10-CM | POA: Diagnosis not present

## 2024-05-05 DIAGNOSIS — D61818 Other pancytopenia: Secondary | ICD-10-CM

## 2024-05-05 DIAGNOSIS — A498 Other bacterial infections of unspecified site: Secondary | ICD-10-CM

## 2024-05-05 DIAGNOSIS — T8459XD Infection and inflammatory reaction due to other internal joint prosthesis, subsequent encounter: Secondary | ICD-10-CM

## 2024-05-05 DIAGNOSIS — T8451XD Infection and inflammatory reaction due to internal right hip prosthesis, subsequent encounter: Secondary | ICD-10-CM

## 2024-05-05 DIAGNOSIS — M0579 Rheumatoid arthritis with rheumatoid factor of multiple sites without organ or systems involvement: Secondary | ICD-10-CM

## 2024-05-05 MED ORDER — LEVOFLOXACIN 500 MG PO TABS: 500.0000 mg | ORAL_TABLET | Freq: Every day | ORAL | 6 refills | Status: DC

## 2024-05-05 MED ORDER — LEVOFLOXACIN 750 MG PO TABS: 750.0000 mg | ORAL_TABLET | Freq: Every day | ORAL | 11 refills | Status: AC

## 2024-05-05 NOTE — Telephone Encounter (Signed)
 Copied from CRM #[id number]. Topic: Clinical - Medication Question >> May 05, 2024  9:36 AM Deaijah H wrote: Reason for CRM: Patient called in wanting to know if Dr. Geofm wants her to take covid/flu shot. Hasn't had any injections, but has been real sick. Please call.

## 2024-05-06 LAB — COMPLETE METABOLIC PANEL WITHOUT GFR
AG Ratio: 1.2 (calc) (ref 1.0–2.5)
ALT: 8 U/L (ref 6–29)
AST: 16 U/L (ref 10–35)
Albumin: 3.3 g/dL — ABNORMAL LOW (ref 3.6–5.1)
Alkaline phosphatase (APISO): 54 U/L (ref 37–153)
BUN: 16 mg/dL (ref 7–25)
CO2: 29 mmol/L (ref 20–32)
Calcium: 8.8 mg/dL (ref 8.6–10.4)
Chloride: 98 mmol/L (ref 98–110)
Creat: 0.7 mg/dL (ref 0.60–0.95)
Globulin: 2.8 g/dL (ref 1.9–3.7)
Glucose, Bld: 61 mg/dL — ABNORMAL LOW (ref 65–99)
Potassium: 3.7 mmol/L (ref 3.5–5.3)
Sodium: 137 mmol/L (ref 135–146)
Total Bilirubin: 0.2 mg/dL (ref 0.2–1.2)
Total Protein: 6.1 g/dL (ref 6.1–8.1)

## 2024-05-06 LAB — CBC WITH DIFFERENTIAL/PLATELET
Absolute Lymphocytes: 1175 {cells}/uL (ref 850–3900)
Absolute Monocytes: 838 {cells}/uL (ref 200–950)
Basophils Absolute: 40 {cells}/uL (ref 0–200)
Basophils Relative: 0.6 %
Eosinophils Absolute: 198 {cells}/uL (ref 15–500)
Eosinophils Relative: 3 %
HCT: 30.8 % — ABNORMAL LOW (ref 35.9–46.0)
Hemoglobin: 10 g/dL — ABNORMAL LOW (ref 11.7–15.5)
MCH: 31 pg (ref 27.0–33.0)
MCHC: 32.5 g/dL (ref 31.6–35.4)
MCV: 95.4 fL (ref 81.4–101.7)
MPV: 9.4 fL (ref 7.5–12.5)
Monocytes Relative: 12.7 %
Neutro Abs: 4349 {cells}/uL (ref 1500–7800)
Neutrophils Relative %: 65.9 %
Platelets: 336 Thousand/uL (ref 140–400)
RBC: 3.23 Million/uL — ABNORMAL LOW (ref 3.80–5.10)
RDW: 14.5 % (ref 11.0–15.0)
Total Lymphocyte: 17.8 %
WBC: 6.6 Thousand/uL (ref 3.8–10.8)

## 2024-05-06 LAB — SEDIMENTATION RATE: Sed Rate: 94 mm/h — ABNORMAL HIGH (ref 0–30)

## 2024-05-06 LAB — PREALBUMIN: Prealbumin: 14 mg/dL — ABNORMAL LOW (ref 17–34)

## 2024-05-06 LAB — C-REACTIVE PROTEIN: CRP: 21.6 mg/L — ABNORMAL HIGH (ref <8.0)

## 2024-05-10 NOTE — Telephone Encounter (Signed)
 Message left for patient today that it is okay to receive both vaccines.

## 2024-05-11 ENCOUNTER — Telehealth: Payer: Self-pay

## 2024-05-11 NOTE — Telephone Encounter (Signed)
 Copied from CRM #[id number]. Topic: Clinical - Medical Advice >> May 11, 2024 12:40 PM China J wrote: Reason for CRM: The patient was prescribed mirtazapine  (REMERON ) 7.5 MG tablets for sleeping but she also has DULoxetine  (CYMBALTA ) 20 MG capsule that was originally used for sleep. She would like to know if she needs to remove the duloxetine  from the patient's medications she is taking.  Please call daughter at [phone].

## 2024-05-12 NOTE — Telephone Encounter (Signed)
 I think she should continue both for now.  Cymbalta  also helps with chronic pain in addition to be an antidepressant.

## 2024-05-12 NOTE — Telephone Encounter (Signed)
 Message left for Sherri [name] today with info.

## 2024-05-17 ENCOUNTER — Ambulatory Visit

## 2024-05-17 ENCOUNTER — Telehealth: Payer: Self-pay

## 2024-05-17 VITALS — BP 110/62 | HR 88 | Ht 60.0 in | Wt 92.0 lb

## 2024-05-17 DIAGNOSIS — Z0000 Encounter for general adult medical examination without abnormal findings: Secondary | ICD-10-CM | POA: Diagnosis not present

## 2024-05-17 NOTE — Telephone Encounter (Signed)
 Message left for patient.  Yes she should get the flu vaccine and COVID is strictly her choice but based on medical history yes it would not hurt for her to get it.

## 2024-05-17 NOTE — Patient Instructions (Addendum)
 [name],  Thank you for taking the time for your Medicare Wellness Visit. I appreciate your continued commitment to your health goals. Please review the care plan we discussed, and feel free to reach out if I can assist you further.  Please note that Annual Wellness Visits do not include a physical exam. Some assessments may be limited, especially if the visit was conducted virtually. If needed, we may recommend an in-person follow-up with your provider.  Ongoing Care Seeing your primary care provider every 3 to 6 months helps us  monitor your health and provide consistent, personalized care.   Referrals If a referral was made during today's visit and you haven't received any updates within two weeks, please contact the referred provider directly to check on the status.  Recommended Screenings:  Health Maintenance  Topic Date Due   Flu Shot  12/25/2023   COVID-19 Vaccine (10 - 2025-26 season) 01/25/2024   Medicare Annual Wellness Visit  05/17/2025   DTaP/Tdap/Td vaccine (4 - Td or Tdap) 01/08/2031   Pneumococcal Vaccine for age over 83  Completed   Osteoporosis screening with Bone Density Scan  Completed   Zoster (Shingles) Vaccine  Completed   Meningitis B Vaccine  Aged Out       05/17/2024    3:36 PM  Advanced Directives  Does Patient Have a Medical Advance Directive? Yes  Type of Estate Agent of [place];Living will  Does patient want to make changes to medical advance directive? Yes (Inpatient - patient requests chaplain consult to change a medical advance directive)  Copy of Healthcare Power of Attorney in Chart? No - copy requested    Vision: Annual vision screenings are recommended for early detection of glaucoma, cataracts, and diabetic retinopathy. These exams can also reveal signs of chronic conditions such as diabetes and high blood pressure.  Dental: Annual dental screenings help detect early signs of oral cancer, gum disease, and other  conditions linked to overall health, including heart disease and diabetes.

## 2024-05-17 NOTE — Telephone Encounter (Signed)
 requesting a return phonecall from nurse - inquiring if it is okay to get the Influenza and COVID vaccines due to medical conditions - okay to wait for Burns to return to office

## 2024-05-17 NOTE — Progress Notes (Signed)
 "  Chief Complaint  Patient presents with   Medicare Wellness     Subjective:  Please attest and cosign this visit due to patients primary care provider not being in the office at the time the visit was completed.  (Pt of Dr Glade Hope)   Sherri  [name] is a 83 y.o. female who presents for a Medicare Annual Wellness Visit.  Visit info / Clinical Intake: Medicare Wellness Visit Type:: Subsequent Annual Wellness Visit Persons participating in visit and providing information:: patient Medicare Wellness Visit Mode:: In-person (required for WTM) Interpreter Needed?: No Pre-visit prep was completed: yes AWV questionnaire completed by patient prior to visit?: no Living arrangements:: lives with spouse/significant other Patient's Overall Health Status Rating: good Typical amount of pain: none Does pain affect daily life?: no Are you currently prescribed opioids?: no  Dietary Habits and Nutritional Risks How many meals a day?: 3 Eats fruit and vegetables daily?: yes Most meals are obtained by: preparing own meals In the last 2 weeks, have you had any of the following?: none Diabetic:: no  Functional Status Activities of Daily Living (to include ambulation/medication): Independent Feeding: Independent Dressing/Grooming: Independent Bathing: Independent Toileting: Independent Transfer: Independent Ambulation: Independent with device- listed below Home Assistive Devices/Equipment: Eyeglasses; Walker (specify Type); Wheelchair; Beside Commode; Brace (specify type) (wears a brace for back/legs at night) Medication Administration: Independent Is this a change from baseline?: Pre-admission baseline Home Management (perform basic housework or laundry): Independent Manage your own finances?: yes Primary transportation is: driving Concerns about vision?: no *vision screening is required for WTM* Concerns about hearing?: no  Fall Screening Falls in the past year?: 1 Number of falls  in past year: 1 (3) Was there an injury with Fall?: 1 (right elbow/hip) Fall Risk Category Calculator: 3 Patient Fall Risk Level: High Fall Risk  Fall Risk Patient at Risk for Falls Due to: History of fall(s); Impaired balance/gait Fall risk Follow up: Falls evaluation completed; Falls prevention discussed  Home and Transportation Safety: All rugs have non-skid backing?: N/A, no rugs All stairs or steps have railings?: yes Grab bars in the bathtub or shower?: yes Have non-skid surface in bathtub or shower?: yes Good home lighting?: yes Regular seat belt use?: yes Hospital stays in the last year:: (!) yes How many hospital stays:: 5 Reason: fall  Cognitive Assessment Difficulty concentrating, remembering, or making decisions? : no Will 6CIT or Mini Cog be Completed: yes What year is it?: 0 points What month is it?: 0 points Give patient an address phrase to remember (5 components): [address] [place] About what time is it?: 0 points Count backwards from 20 to 1: 0 points Say the months of the year in reverse: 0 points Repeat the address phrase from earlier: 2 points (Drive) 6 CIT Score: 2 points  Advance Directives (For Healthcare) Does Patient Have a Medical Advance Directive?: Yes Does patient want to make changes to medical advance directive?: Yes (Inpatient - patient requests chaplain consult to change a medical advance directive) Type of Advance Directive: Healthcare Power of [place]; Living will Copy of Healthcare Power of Attorney in Chart?: No - copy requested Copy of Living Will in Chart?: No - copy requested Would patient like information on creating a medical advance directive?: No - Patient declined  Reviewed/Updated  Reviewed/Updated: Reviewed All (Medical, Surgical, Family, Medications, Allergies, Care Teams, Patient Goals)    Allergies (verified) Ciprofloxacin , Darvon [propoxyphene hcl], Demerol [meperidine], Penicillins, Imuran [azathioprine], Sulfa   antibiotics, Arava [leflunomide], Lipitor [atorvastatin ], and Vytorin [ezetimibe-simvastatin]  Current Medications (verified) Outpatient Encounter Medications as of 05/17/2024  Medication Sig   acetaminophen  (TYLENOL ) 325 MG tablet Take 2 tablets (650 mg total) by mouth every 6 (six) hours as needed for mild pain (pain score 1-3) or fever (or Fever >/= 101).   ascorbic acid  (VITAMIN C ) 1000 MG tablet Take 1 tablet (1,000 mg total) by mouth daily.   Biotin  5000 MCG TABS Take 10,000 mcg by mouth in the morning.   Carboxymethylcellul-Glycerin  (LUBRICATING EYE DROPS OP) Place 1 drop into both eyes 3 (three) times daily as needed (dry eyes).   chlorhexidine  (PERIDEX ) 0.12 % solution Use as directed 15 mLs in the mouth or throat 4 (four) times daily.   Cholecalciferol  (VITAMIN D3) 1000 units CAPS Take 1,000 Units by mouth daily.   cyclobenzaprine  (FLEXERIL ) 10 MG tablet TAKE 1 TABLET BY MOUTH THREE TIMES A DAY AS NEEDED FOR MUSCLE SPASM (Patient taking differently: Take 10 mg by mouth 3 (three) times daily as needed for muscle spasms.)   denosumab  (PROLIA ) 60 MG/ML SOSY injection Inject 60 mg into the skin every 6 (six) months. Deliver to rheum: [address], [address], [place], [place] [address]. Appt on 07/23/2022   diazepam  (VALIUM ) 5 MG tablet Take 1 tablet (5 mg total) by mouth at bedtime as needed for anxiety. TAKE 1 TABLET BY MOUTH AT BEDTIME AS NEEDED FOR MUSCLE SPASMS.   DULoxetine  (CYMBALTA ) 20 MG capsule TAKE 1 CAPSULE BY MOUTH AT BEDTIME.   folic acid  (FOLVITE ) 1 MG tablet Take 1 tablet (1 mg total) by mouth daily.   furosemide  (LASIX ) 20 MG tablet Take 0.5 tablets (10 mg total) by mouth daily.   Incontinence Supply Disposable (REALITY INCONTINENT BRIEFS SM) MISC UAD for urinary incontinence   Lactobacillus (ACIDOPHILUS) CAPS capsule Take 2 capsules by mouth 3 (three) times daily.   levofloxacin  (LEVAQUIN ) 750 MG tablet Take 1 tablet (750 mg total) by mouth daily.   lidocaine  (XYLOCAINE ) 2 %  solution Use as directed 15 mLs in the mouth or throat every 3 (three) hours as needed for mouth pain.   magic mouthwash (nystatin , lidocaine , diphenhydrAMINE , alum & mag hydroxide) suspension Swish and spit 5 mLs 3 (three) times daily as needed for mouth pain.   mirtazapine  (REMERON ) 7.5 MG tablet TAKE 1 TABLET BY MOUTH AT BEDTIME.   Nystatin  (GERHARDT'S BUTT CREAM) CREA Apply 1 Application topically 3 (three) times daily. (Patient taking differently: Apply 1 Application topically See admin instructions. Apply as directed to buttocks after every cleansing/drying)   ondansetron  (ZOFRAN ) 4 MG tablet Take 1 tablet (4 mg total) by mouth every 6 (six) hours as needed for nausea.   pantoprazole  (PROTONIX ) 40 MG tablet TAKE 1 TABLET (40 MG TOTAL) BY MOUTH TWICE A DAY BEFORE MEALS   phenol (CHLORASEPTIC) 1.4 % LIQD Use as directed 1 spray in the mouth or throat as needed for throat irritation / pain.   polyethylene glycol powder (GLYCOLAX /MIRALAX ) 17 GM/SCOOP powder Take 17 g by mouth daily. (Patient taking differently: Take 17 g by mouth See admin instructions. Mix 17 grams into a cup of coffee and drink in the morning)   predniSONE  (DELTASONE ) 5 MG tablet Take 1 tablet (5 mg total) by mouth daily with breakfast.   Zinc  Sulfate 220 (50 Zn) MG TABS Take 1 tablet (220 mg total) by mouth daily with supper.   No facility-administered encounter medications on file as of 05/17/2024.    History: Past Medical History:  Diagnosis Date   Anxiety    takes  Valium  daily as needed   Basal cell carcinoma 01/21/1988   left nostril (MOHS), sup-right calf (CX35FU)   Basal cell carcinoma 03/22/1996   right calf (CX35FU)   Basal cell carcinoma 03/31/1995   left wing nose   Bruises easily    d/t meds   Cancer (HCC)    basal cell ca, in situ- uterine    Cataracts, bilateral    removed bilateral   Chronic back pain    Dizziness    r/t to meds   GERD (gastroesophageal reflux disease)    no meds on a regular  basis but will take Tums if needed   Headache(784.0)    r/t neck issues   History of bronchitis 6-5yrs ago   History of colon polyps    benign   HOH (hard of hearing)    wears hearing aids   HOH (hard of hearing)    wears hearing aids    Hyperlipidemia    takes Atorvastatin  on Mondays and Fridays   Hypertension    Joint pain    Joint swelling    Multiple sclerosis    Neuromuscular disorder (HCC)    Dr. FABIENE Crete- [facility], follows [facility].   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   Rheumatoid arthritis (HCC)    Dr Lorrayne weekly, RA- hands- knees- feet    Rheumatoid arthritis(714.0)    Dr Dolphus takes Xeljanz  daily   Right wrist fracture    Skin cancer    Squamous cell carcinoma of skin 11/02/2001   in situ-right knee (cx28fu)   Squamous cell carcinoma of skin 11/12/2011   in situ-left forearm (CX35FU), in situ-left foot (CX35FU)   Squamous cell carcinoma of skin 01/22/2012   in situ-left lower forearm (txpbx)   Squamous cell carcinoma of skin 11/17/2012   right shin (txpbx)   Squamous cell carcinoma of skin 10/28/2013   in situ-right shoulder (CX35FU)   Squamous cell carcinoma of skin 04/28/2014   well diff-right shin (txpbx)   Squamous cell carcinoma of skin 11/02/2014   in situ-Left shin (txpbx)   Squamous cell carcinoma of skin 12/15/2014   in situ-Left hand (txpbx), bowens-left side chest (txpbx)   Squamous cell carcinoma of skin 05/09/2015   in situ-left hand (txpbx)    Squamous cell carcinoma of skin 05/07/2016   in situ-left inner shin,ant, in situ-left inner shin, post, in situ-left outer forearm, in situ-right knuckle   Squamous cell carcinoma of skin 03/1302018   in situ-left shoulder (txpbx), in situ-right inner shin (txpbx), in situ-top of left foot (txpbx), KA- left forearm (txpbx)    Squamous cell carcinoma of skin 12/02/2016   in situ-left inner shin (txpbx)   Squamous cell carcinoma of skin 03/04/2017   in situ-left outer sup, shin (txpbx), in situ- right 2nd knuckle finger (txpbx), in situ- Left wrist (txpbx)   Squamous cell carcinoma of skin 04/06/2018   in situ-above left knee inner (txpbx)   Squamous cell carcinoma of skin 04/21/2018   in situ-right top hand (txpbx)   Squamous cell carcinoma of skin 07/08/2018   in situ-right lower inner shin (txpbx)   Squamous cell carcinoma of skin 04/27/2019   in situ- Left neck(CX35FU), In situ- right neck (CX35FU)   Urinary retention    sees Dr.Wrenn about 2 times a yr   Past Surgical History:  Procedure Laterality  Date   ABDOMINAL HYSTERECTOMY     1985   ANTERIOR CERVICAL DECOMP/DISCECTOMY FUSION N/A 04/23/2022   Procedure: Anterior Cervical Decompression/Discectomy Fusion - Cervical Seven-Thoracic One;  Surgeon: Louis Shove, MD;  Location: [facility] OR;  Service: Neurosurgery;  Laterality: N/A;  3C   ANTERIOR HIP REVISION Right 01/21/2024   Procedure: REVISION, TOTAL ARTHROPLASTY, HIP, ANTERIOR APPROACH;  Surgeon: Fidel Rogue, MD;  Location: [facility];  Service: Orthopedics;  Laterality: Right;   APPENDECTOMY     with TAH   BACK SURGERY     several   BREAST BIOPSY Right    Results were negative   BROW LIFT Bilateral 10/02/2016   Procedure: BILATERAL LOWER LID BLEPHAROPLASTY;  Surgeon: Laurie Loyd Redhead, MD;  Location: [facility] OR;  Service: Plastics;  Laterality: Bilateral;   CARPAL TUNNEL RELEASE Right    cataracts     CERVICAL FUSION     Dr Leeann   CERVICAL FUSION  12/31/2012   Dr Leeann   CERVICAL FUSION  04/2022   CHEST TUBE INSERTION     for traumatic Pneumothorax   COLONOSCOPY  07/16/2010   normal    COLONOSCOPY W/ POLYPECTOMY  1997   negative since; Dr Debrah   ESOPHAGOGASTRODUODENOSCOPY  07/16/2010   normal   eye lid raise     EYE SURGERY Bilateral    cataracts removed - /w IOL   HARDWARE REMOVAL   08/2021   HIP CLOSED REDUCTION Right 11/16/2023   Procedure: CLOSED MANIPULATION, JOINT, HIP;  Surgeon: Ernie Cough, MD;  Location: [facility];  Service: Orthopedics;  Laterality: Right;   HIP CLOSED REDUCTION N/A 11/30/2023   Procedure: CLOSED REDUCTION, HIP;  Surgeon: Fidel Rogue, MD;  Location: [facility] OR;  Service: Orthopedics;  Laterality: N/A;   INCISION AND DRAINAGE OF WOUND Right 11/28/2023   Procedure: IRRIGATION AND DEBRIDEMENT WOUND; REMOVAL OF HARDWARE;  Surgeon: Edna Toribio LABOR, MD;  Location: [facility];  Service: Orthopedics;  Laterality: Right;  I & D RIGHT ELBOW   JOINT REPLACEMENT     LUMBAR FUSION     Dr Leeann   NASAL SINUS SURGERY     ORIF ELBOW FRACTURE Right 11/20/2023   Procedure: OPEN REDUCTION INTERNAL FIXATION (ORIF) ELBOW/OLECRANON FRACTURE;  Surgeon: [name] Franky SQUIBB, MD;  Location: [facility] OR;  Service: Orthopedics;  Laterality: Right;   POSTERIOR CERVICAL FUSION/FORAMINOTOMY N/A 12/31/2012   Procedure: POSTERIOR LATERAL CERVICAL FUSION/FORAMINOTOMY LEVEL 1 CERVICAL THREE-FOUR WITH LATERAL MASS SCREWS;  Surgeon: Catalina CHRISTELLA Leeann, MD;  Location: [facility];  Service: Neurosurgery;  Laterality: N/A;   PTOSIS REPAIR Bilateral 10/02/2016   Procedure: INTERNAL PTOSIS REPAIR;  Surgeon: Laurie Loyd Redhead, MD;  Location: [facility];  Service: Plastics;  Laterality: Bilateral;   SKIN BIOPSY     THYROID  SURGERY     R lobe removed- 1972, has grown back - CT last done- 2018   TOTAL HIP ARTHROPLASTY  12/22/2011   Procedure: TOTAL HIP ARTHROPLASTY;  Surgeon: Dempsey JINNY Sensor, MD;  Location: [facility] OR;  Service: Orthopedics;  Laterality: Left;   TOTAL HIP ARTHROPLASTY Right 11/12/2023   Procedure: ARTHROPLASTY, HIP, TOTAL, ANTERIOR APPROACH;  Surgeon: Fidel Rogue, MD;  Location: [facility];  Service: Orthopedics;  Laterality: Right;   TOTAL HIP REVISION Right 12/03/2023   Procedure: TOTAL HIP REVISION;  Surgeon: Fidel Rogue, MD;  Location: [facility];  Service: Orthopedics;  Laterality: Right;  Right  acetabular revision Anterior approach   TOTAL SHOULDER ARTHROPLASTY     TUBAL LIGATION     UPPER GASTROINTESTINAL  ENDOSCOPY  2012   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   WRIST SURGERY Left 09/16/2022   wrist/arm   Family History  Problem Relation Age of Onset   Cancer Mother        pancreatic   Diabetes Mother    Pancreatic cancer Mother    Heart disease Father        Rheumatic   Cancer Father        ? stomach   Stomach cancer Father    Cancer Sister        stomach   Stomach cancer Sister    Cancer Maternal Aunt        X 4; ? primary   Heart disease Paternal Aunt    Cancer Maternal Grandmother        cervical   Colon cancer Other        Aunts   Multiple sclerosis Daughter    Hypertension Neg Hx    Stroke Neg Hx    Esophageal cancer Neg Hx    Rectal cancer Neg Hx    Social History   Occupational History   Occupation: retired  Tobacco Use   Smoking status: Former    Current packs/day: 0.00    Average packs/day: 2.5 packs/day for 20.0 years (50.0 ttl pk-yrs)    Types: Cigarettes    Start date: 05/26/1961    Quit date: 05/26/1981    Years since quitting: 43.0    Passive exposure: Never   Smokeless tobacco: Never   Tobacco comments:    Smoked [phone], up to 2.5 ppd  Vaping Use   Vaping status: Never Used  Substance and Sexual Activity   Alcohol  use: Yes    Alcohol /week: 14.0 standard drinks of alcohol     Types: 14 Cans of beer per week    Comment: 2% beer daily   Drug use: Never   Sexual activity: Not Currently    Birth control/protection: Surgical    Comment: Hysterectomy   Tobacco Counseling Counseling given: Yes Tobacco comments: Smoked [phone], up to 2.5 ppd  SDOH Screenings   Food Insecurity: No Food Insecurity (05/17/2024)  Housing: Unknown (05/17/2024)  Transportation Needs: No Transportation Needs (05/17/2024)  Utilities: Not At Risk (05/17/2024)  Alcohol  Screen: Low Risk (02/21/2022)  Depression (PHQ2-9): Low Risk (05/17/2024)  Recent  Concern: Depression (PHQ2-9) - Medium Risk (04/08/2024)  Financial Resource Strain: Low Risk (02/21/2022)  Physical Activity: Insufficiently Active (05/17/2024)  Social Connections: Moderately Isolated (05/17/2024)  Stress: Stress Concern Present (05/17/2024)  Tobacco Use: Medium Risk (05/17/2024)  Health Literacy: Adequate Health Literacy (05/17/2024)   See flowsheets for full screening details  Depression Screen Depression Screening Exception Documentation Depression Screening Exception:: Other- indicate reason in comment box (Unable to speak with patient. Spoke with daughter)  PHQ 2 & 9 Depression Scale- Over the past 2 weeks, how often have you been bothered by any of the following problems? Little interest or pleasure in doing things: 0 Feeling down, depressed, or hopeless (PHQ Adolescent also includes...irritable): 0 PHQ-2 Total Score: 0 Trouble falling or staying asleep, or sleeping too much: 1 (due to wearing a brace) Feeling tired or having little energy: 0 Poor appetite or overeating (PHQ Adolescent also includes...weight loss): 0 Feeling bad about yourself - or that you are a failure or have let yourself or your family down: 0 Trouble concentrating on things, such as reading the newspaper or watching television (PHQ Adolescent also includes...like school work): 0 Moving or speaking so slowly that other people could  have noticed. Or the opposite - being so fidgety or restless that you have been moving around a lot more than usual: 3 (uses a walker/wheelchair) Thoughts that you would be better off dead, or of hurting yourself in some way: 0 PHQ-9 Total Score: 4 If you checked off any problems, how difficult have these problems made it for you to do your work, take care of things at home, or get along with other people?: Not difficult at all  Depression Treatment Depression Interventions/Treatment : EYV7-0 Score <4 Follow-up Not Indicated     Goals Addressed                This Visit's Progress     Patient Stated (pt-stated)        Patient stated she plans to continue physical therapy and occupational therapy weekly             Objective:    Today's Vitals   05/17/24 1534  BP: 110/62  Pulse: 88  SpO2: 98%  Weight: 92 lb (41.7 kg)  Height: 5' (1.524 m)   Body mass index is 17.97 kg/m.  Hearing/Vision screen Hearing Screening - Comments:: Denies hearing difficulties   Vision Screening - Comments:: Wears rx glasses - up to date with routine eye exams with Arlyss King Immunizations and Health Maintenance Health Maintenance  Topic Date Due   Influenza Vaccine  12/25/2023   COVID-19 Vaccine (10 - 2025-26 season) 01/25/2024   Medicare Annual Wellness (AWV)  05/17/2025   DTaP/Tdap/Td (4 - Td or Tdap) 01/08/2031   Pneumococcal Vaccine: 50+ Years  Completed   Bone Density Scan  Completed   Zoster Vaccines- Shingrix  Completed   Meningococcal B Vaccine  Aged Out        Assessment/Plan:  This is a routine wellness examination for Sherri [name] .  Patient Care Team: [name] PARAS, MD as PCP - General (Internal Medicine) Vear, Charlie LABOR, MD (Neurology) Dolphus Reiter, MD as Consulting Physician (Rheumatology) Fate Morna SAILOR, [facility] (Inactive) as Pharmacist (Pharmacist) Sheffield, Andrez SAUNDERS, PA-C (Inactive) as Physician Assistant (Dermatology) Caleen Dirks, MD as Consulting Physician (Internal Medicine) Duwayne Purchase, MD as Consulting Physician (Orthopedic Surgery) Fidel Rogue, MD as Consulting Physician (Orthopedic Surgery) Default, Provider, MD as Technician King Arlyss, MD as Consulting Physician (Ophthalmology)  I have personally reviewed and noted the following in the patients chart:   Medical and social history Use of alcohol , tobacco or illicit drugs  Current medications and supplements including opioid prescriptions. Functional ability and status Nutritional status Physical activity Advanced directives List of other  physicians Hospitalizations, surgeries, and ER visits in previous 12 months Vitals Screenings to include cognitive, depression, and falls Referrals and appointments  No orders of the defined types were placed in this encounter.  In addition, I have reviewed and discussed with patient certain preventive protocols, quality metrics, and best practice recommendations. A written personalized care plan for preventive services as well as general preventive health recommendations were provided to patient.   Sherri [name], CMA   05/17/2024   Return in 1 year (on 05/17/2025).  After Visit Summary: (In Person-Declined) Patient declined AVS at this time.  Nurse Notes: scheduled 2026 AWV/CPE appts; Message sent to PCP - pt inquiring if necessary for her to have the Influenza and COVID vaccines. "

## 2024-05-18 ENCOUNTER — Ambulatory Visit: Payer: Self-pay

## 2024-05-18 NOTE — Telephone Encounter (Signed)
 FYI Only or Action Required?: Action required by provider: Refusing ED, needs call back, alerted CAL to ED refusal.  Patient was last seen in primary care on 04/08/2024 by [name] PARAS, MD.  Called Nurse Triage reporting Leg Swelling and Arm Swelling.  Symptoms began not clarified but recently.  Interventions attempted: Prescription medications: lasix  and Other: home health.  Symptoms are: rapidly worsening.  Triage Disposition: Go to ED Now (Notify PCP)  Patient/caregiver understands and will follow disposition?: No, refuses disposition      Copied from CRM [id number]. Topic: Clinical - Red Word Triage >> May 18, 2024 11:42 AM Anairis L wrote: Red Word that prompted transfer to Nurse Triage: Bilateral legs /hands swelling. Ellouise from [facility] nurse was concern. Reason for Disposition  Difficulty breathing at rest  Answer Assessment - Initial Assessment Questions This RN recommended pt be examined in ED for symptoms, pt refusing. Advised pt to call 911 if any worsening. Sending message to PCP for call back to pt. Alerted CAL to ED refusal.    This is an ongoing situation since April 4 surgeries on ribs, they pop out of place, wearing a brace, can't walk Dr. 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

## 2024-05-20 NOTE — Telephone Encounter (Signed)
 LVM for patient, attempted to reach 2x

## 2024-05-25 ENCOUNTER — Other Ambulatory Visit: Payer: Self-pay | Admitting: Internal Medicine

## 2024-05-25 ENCOUNTER — Telehealth: Payer: Self-pay | Admitting: *Deleted

## 2024-05-25 NOTE — Telephone Encounter (Signed)
 Patient reached out to the office and left message asking if she may restart the [name] . Patient states she is having increased pain in her arms and neck.   Reached out to patient and advised Dr. Dolphus and Waddell are out of the office and I would forward the message and get back to her once they return to the office. Patient expressed understanding.

## 2024-05-26 NOTE — Telephone Encounter (Signed)
 Patient will require clearance by ID and surgeon prior to resuming rinvoq .

## 2024-05-27 NOTE — Telephone Encounter (Signed)
 Patient advised she will require clearance by ID and surgeon prior to resuming rinvoq .

## 2024-05-29 ENCOUNTER — Other Ambulatory Visit: Payer: Self-pay | Admitting: Rheumatology

## 2024-05-30 ENCOUNTER — Telehealth: Payer: Self-pay | Admitting: Internal Medicine

## 2024-05-30 MED ORDER — DIAZEPAM 5 MG PO TABS: 5.0000 mg | ORAL_TABLET | Freq: Every evening | ORAL | 5 refills | Status: AC | PRN

## 2024-05-30 NOTE — Telephone Encounter (Signed)
 Prescription Request  05/30/2024  LOV: 04/08/2024  What is the name of the medication or equipment? diazapam  Have you contacted your pharmacy to request a refill? Yes   Which pharmacy would you like this sent to?   [id number] [address], [place] - [address] [place] [address] Phone: [phone] Fax: [phone]    Patient notified that their request is being sent to the clinical staff for review and that they should receive a response within 2 business days.   Please advise at Mobile [phone] (mobile)    Patient also need a prescription  for a Heated lift chair sent to [facility] medical supply in [place] - Patient has already ordered the chair but needs RX sent for it. Phone # [phone]

## 2024-05-31 ENCOUNTER — Ambulatory Visit: Payer: Self-pay

## 2024-05-31 ENCOUNTER — Telehealth: Payer: Self-pay

## 2024-05-31 NOTE — Telephone Encounter (Signed)
 Copied from CRM [id number]. Topic: Clinical - Medication Question >> May 31, 2024  4:03 PM Burnard DEL wrote: Reason for CRM: Patient would like to know if she could be prescribed any pain medication for the joint pain that she has from her severe arthritis that she has?    [id number] [place], [place] - [address]  Phone: [phone] Fax: [phone]

## 2024-05-31 NOTE — Telephone Encounter (Signed)
 FYI Only or Action Required?: Action required by provider: clinical question for provider.  Patient was last seen in primary care on 04/08/2024 by [name] PARAS, MD.  Called Nurse Triage reporting Joint Pain.  Symptoms began several weeks ago.  Interventions attempted: Rest, hydration, or home remedies.  Symptoms are: gradually worsening.Complaining of arthritis pain all over. Asking for pain medication, enough until her appointment 06/06/24. Please advise.  Triage Disposition: See PCP When Office is Open (Within 3 Days)  Patient/caregiver understands and will follow disposition?: Yes      Copied from CRM [id number]. Topic: Clinical - Red Word Triage >> May 31, 2024 10:39 AM Sherri [name] wrote: Red Word that prompted transfer to Nurse Triage: Pt is req medication for arthritis. Has not took medication since April. Pt was advised by Dr. Dolphus to contact PCP for approval in order for her to send in a prescription. Pt is in severe pain. Transferring to NT Reason for Disposition  [1] MODERATE pain (e.g., interferes with normal activities, limping) AND [2] present > 3 days  Answer Assessment - Initial Assessment Questions 1. LOCATION and RADIATION: Where is the pain located? Does the pain spread (shoot) anywhere else?     All my joints 2. QUALITY: What does the pain feel like?  (e.g., sharp, dull, aching, burning)     achy 3. SEVERITY: How bad is the pain? What does it keep you from doing?   (Scale 1-10; or mild, moderate, severe)     10 4. ONSET: When did the pain start? Does it come and go, or is it there all the time?     For a long time 5. WORK OR EXERCISE: Has there been any recent work or exercise that involved this part of the body?      no 6. CAUSE: What do you think is causing the hip pain?      arthritis 7. AGGRAVATING FACTORS: What makes the hip pain worse? (e.g., walking, climbing stairs, running)     walking 8. OTHER SYMPTOMS: Do you have any other  symptoms? (e.g., back pain, pain shooting down leg,  fever, rash)     My back is bad too.  Protocols used: Hip Pain-A-AH

## 2024-06-01 ENCOUNTER — Ambulatory Visit (INDEPENDENT_AMBULATORY_CARE_PROVIDER_SITE_OTHER): Admitting: Otolaryngology

## 2024-06-01 ENCOUNTER — Encounter (INDEPENDENT_AMBULATORY_CARE_PROVIDER_SITE_OTHER): Payer: Self-pay | Admitting: Otolaryngology

## 2024-06-01 ENCOUNTER — Ambulatory Visit (INDEPENDENT_AMBULATORY_CARE_PROVIDER_SITE_OTHER)

## 2024-06-01 VITALS — HR 90 | Temp 97.9°F | Wt 106.0 lb

## 2024-06-01 DIAGNOSIS — H903 Sensorineural hearing loss, bilateral: Secondary | ICD-10-CM | POA: Diagnosis not present

## 2024-06-01 DIAGNOSIS — H6123 Impacted cerumen, bilateral: Secondary | ICD-10-CM | POA: Diagnosis not present

## 2024-06-01 DIAGNOSIS — E041 Nontoxic single thyroid nodule: Secondary | ICD-10-CM

## 2024-06-01 NOTE — Progress Notes (Signed)
 Patient ID: Sherri  Sherri [name], female   DOB: 1940-07-26, 84 y.o.   MRN: [id number]  Follow up: Bilateral sensorineural hearing loss, right thyroid  nodule  History of Present Illness Sherri  [name] is an 84 year old female with bilateral sensorineural hearing loss who presents with decreased hearing.  She reports significant difficulty hearing and states she cannot hear well. She wears hearing aids daily but finds them bothersome and has not noticed improvement in her hearing with their use. She denies otalgia. Symptoms have persisted despite prior evaluation several weeks ago.  She has recently found to have bilateral cerumen impaction.  She has a history of a right thyroid  nodule.  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  She has not noted any change in her thyroid  nodule.  Exam: General: Communicates without difficulty, well nourished, no acute distress. Head: Normocephalic, no evidence injury, no tenderness, facial buttresses intact without stepoff. Face/sinus: No tenderness to palpation and percussion. Facial movement is normal and symmetric. Eyes: PERRL, EOMI. No scleral icterus, conjunctivae clear. Neuro: CN II exam reveals vision grossly intact.  No nystagmus at any point of gaze. Ears: Auricles well formed without lesions.  Bilateral cerumen impaction.  Nose: External evaluation reveals normal support and skin without lesions.  Dorsum is intact.  Anterior rhinoscopy reveals congested mucosa over anterior aspect of inferior turbinates and intact septum.  No purulence noted. Oral:  Oral cavity and oropharynx are intact, symmetric, without erythema or edema.  Mucosa is moist without lesions. Neck: Full range of motion without pain.  There is no significant lymphadenopathy.  No masses palpable.  Thyroid  bed within normal limits to palpation.  Parotid glands and submandibular glands equal bilaterally without mass.  Trachea is midline. Neuro:  CN 2-12 grossly intact.   Procedure:  Bilateral cerumen disimpaction Anesthesia: None Description: Under the operating microscope, the cerumen is carefully removed with a combination of cerumen currette, alligator forceps, and suction catheters.  After the cerumen is removed, the TMs are noted to be normal.  No mass, erythema, or lesions. The patient tolerated the procedure well.    Assessment & Plan Impacted cerumen, bilateral Cerumen was completely occluding both external auditory canals, likely contributing to decreased hearing. No otalgia reported. Tympanic membranes appeared normal post-removal. - Otomicroscopy with bilateral cerumen disimpaction.  Sensorineural hearing loss, bilateral Chronic bilateral sensorineural hearing loss managed with daily hearing aid use, though she finds them bothersome. Hearing was further diminished by cerumen impaction, which was addressed. Further improvement may be possible with hearing aid adjustment. - Referred to audiology for hearing aid adjustment. - Advised her to reassess hearing aid benefit following cerumen removal.  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  The patient is currently asymptomatic.

## 2024-06-01 NOTE — Progress Notes (Signed)
" °  [address] [place], [place] [address] [phone]  Hearing Aid Check     Sherri [name] comes for a scheduled appointment for a hearing aid check.  Accompanied by: caregiver     Right  Left  Hearing aid manufacturer [employer] P50-R SN: [id number] Renaldo Kirschner P50-R SN: [id number]  Hearing aid style Receiver-in-the-canal Receiver-in-the-canal  Hearing aid battery rechargeable rechargeable  Receiver M M  Dome/ custom earpiece Small double dome Small vented dome  Retention wire no no  Warranty expiration date 07-15-2022 07-15-2022  Initial fitting date 04-23-2020 04-23-2020  [facility]     Chief complaint: Patient reports feedback in both ears, left more than right.  Actions taken: Examination of the ear canals revealed the presence of cerumen bilaterally. It was also observed that the patients hearing devices were not fully inserted. Dr. Karis was consulted and approved cerumen removal during todays visit.  After successful cerumen removal, the hearing aids were reinserted, and no feedback was detected. Counseling was provided regarding the importance of proper hearing-aid placement within the ear canal. The option of obtaining custom earmolds was discussed; however, the patient declined at this time. She expressed a preference to continue using her current hearing aids now that the feedback has resolved and the cerumen has been removed.  Services fee: $0 was paid at checkout. Services were provided as a one time courtesy due to aids being out of warranty.    Recommend: Return for a hearing aid check if problems or questions occur. Return for a hearing evaluation and to see an ENT, if concerns with hearing changes arise.  Arlean Rake, AUD "

## 2024-06-01 NOTE — Telephone Encounter (Signed)
 Message sent to Dr. Geofm already regarding request for pain medication.

## 2024-06-01 NOTE — Telephone Encounter (Signed)
 Patient called in that she needs a letter from  her PCP stating that the heated lift chair that she is needing  is a medical necessity so that she can have the sales tax taken off of it.

## 2024-06-02 ENCOUNTER — Other Ambulatory Visit: Payer: Self-pay | Admitting: Internal Medicine

## 2024-06-02 MED ORDER — HYDROCODONE-ACETAMINOPHEN 5-325 MG PO TABS: 1.0000 | ORAL_TABLET | Freq: Three times a day (TID) | ORAL | 0 refills | Status: DC | PRN

## 2024-06-02 NOTE — Telephone Encounter (Signed)
 Message left for patient today.

## 2024-06-02 NOTE — Telephone Encounter (Signed)
 Message left for patient today.  She has upcoming appointment on Monday and we can address then.  She will more than likely require a face to face visit so we can discuss on Monday.

## 2024-06-02 NOTE — Telephone Encounter (Signed)
 Typically pain medication should be done during the visit, but I did send a small supply of Vicodin to [facility].  They can discuss further next week.

## 2024-06-05 ENCOUNTER — Encounter: Payer: Self-pay | Admitting: Internal Medicine

## 2024-06-05 NOTE — Progress Notes (Unsigned)
 "     Subjective:    Patient ID: Sherri  [name], female    DOB: April 22, 1941, 84 y.o.   MRN: [id number]     HPI Sherri [name]  is here for follow up of her chronic medical problems.    Reason for Visit: Requires stair lift  Patient suffers from (diagnosis) which impairs her ability to ambulate up the stairs in her home. A cane, walker, crutch will not allow her ambulate safely up the stairs.  Only a stair lift will allow patient to safely get up the stairs.  Patient can safely use the stair lift with assistance.     pain  - flexeril  tid prn, valium  HS prn, cymbalta  20 mg, vicodin 1 tabd q 8 hr    Medications and allergies reviewed with patient and updated if appropriate.  Medications Ordered Prior to Encounter[1]   Review of Systems     Objective:  There were no vitals filed for this visit. BP Readings from Last 3 Encounters:  05/17/24 110/62  05/05/24 (!) 140/79  04/25/24 110/69   Wt Readings from Last 3 Encounters:  06/01/24 106 lb (48.1 kg)  05/17/24 92 lb (41.7 kg)  04/08/24 92 lb (41.7 kg)   There is no height or weight on file to calculate BMI.    Physical Exam     Lab Results  Component Value Date   WBC 6.6 05/05/2024   HGB 10.0 (L) 05/05/2024   HCT 30.8 (L) 05/05/2024   PLT 336 05/05/2024   GLUCOSE 61 (L) 05/05/2024   CHOL 238 (H) 08/06/2023   TRIG 53 08/06/2023   HDL 109 08/06/2023   LDLDIRECT 122.9 11/15/2012   LDLCALC 115 (H) 08/06/2023   ALT 8 05/05/2024   AST 16 05/05/2024   NA 137 05/05/2024   K 3.7 05/05/2024   CL 98 05/05/2024   CREATININE 0.70 05/05/2024   BUN 16 05/05/2024   CO2 29 05/05/2024   TSH 3.196 11/27/2023   INR 1.1 10/02/2023   HGBA1C 6.0 (H) 08/06/2023     Assessment & Plan:    See Problem List for Assessment and Plan of chronic medical problems.        [1]  Current Outpatient Medications on File Prior to Visit  Medication Sig Dispense Refill   HYDROcodone -acetaminophen  (NORCO/VICODIN) 5-325 MG tablet Take  1 tablet by mouth every 8 (eight) hours as needed for severe pain (pain score 7-10) (Chronic joint pain). 30 tablet 0   acetaminophen  (TYLENOL ) 325 MG tablet Take 2 tablets (650 mg total) by mouth every 6 (six) hours as needed for mild pain (pain score 1-3) or fever (or Fever >/= 101). 20 tablet 0   ascorbic acid  (VITAMIN C ) 1000 MG tablet Take 1 tablet (1,000 mg total) by mouth daily. 100 tablet 0   Biotin  5000 MCG TABS Take 10,000 mcg by mouth in the morning.     Carboxymethylcellul-Glycerin  (LUBRICATING EYE DROPS OP) Place 1 drop into both eyes 3 (three) times daily as needed (dry eyes).     chlorhexidine  (PERIDEX ) 0.12 % solution Use as directed 15 mLs in the mouth or throat 4 (four) times daily. 120 mL 0   Cholecalciferol  (VITAMIN D3) 1000 units CAPS Take 1,000 Units by mouth daily.     cyclobenzaprine  (FLEXERIL ) 10 MG tablet TAKE 1 TABLET BY MOUTH THREE TIMES A DAY AS NEEDED FOR MUSCLE SPASM (Patient taking differently: Take 10 mg by mouth 3 (three) times daily as needed for muscle spasms.) 90 tablet 2   denosumab  (  PROLIA ) 60 MG/ML SOSY injection Inject 60 mg into the skin every 6 (six) months. Deliver to rheum: [address], [address], [place], [place] [address]. Appt on 07/23/2022 1 mL 0   diazepam  (VALIUM ) 5 MG tablet Take 1 tablet (5 mg total) by mouth at bedtime as needed for anxiety. TAKE 1 TABLET BY MOUTH AT BEDTIME AS NEEDED FOR MUSCLE SPASMS. 30 tablet 5   DULoxetine  (CYMBALTA ) 20 MG capsule TAKE 1 CAPSULE BY MOUTH EVERYDAY AT BEDTIME 90 capsule 1   folic acid  (FOLVITE ) 1 MG tablet Take 1 tablet (1 mg total) by mouth daily. 90 tablet 3   furosemide  (LASIX ) 20 MG tablet Take 0.5 tablets (10 mg total) by mouth daily. 30 tablet 0   Incontinence Supply Disposable (REALITY INCONTINENT BRIEFS SM) MISC UAD for urinary incontinence 200 each 5   Lactobacillus (ACIDOPHILUS) CAPS capsule Take 2 capsules by mouth 3 (three) times daily. 180 capsule 0   levofloxacin  (LEVAQUIN ) 750 MG tablet Take 1  tablet (750 mg total) by mouth daily. 30 tablet 11   lidocaine  (XYLOCAINE ) 2 % solution Use as directed 15 mLs in the mouth or throat every 3 (three) hours as needed for mouth pain. 240 mL 0   magic mouthwash (nystatin , lidocaine , diphenhydrAMINE , alum & mag hydroxide) suspension Swish and spit 5 mLs 3 (three) times daily as needed for mouth pain. 180 mL 3   mirtazapine  (REMERON ) 7.5 MG tablet TAKE 1 TABLET BY MOUTH AT BEDTIME. 90 tablet 2   Nystatin  (GERHARDT'S BUTT CREAM) CREA Apply 1 Application topically 3 (three) times daily. (Patient taking differently: Apply 1 Application topically See admin instructions. Apply as directed to buttocks after every cleansing/drying) 60 each 0   ondansetron  (ZOFRAN ) 4 MG tablet Take 1 tablet (4 mg total) by mouth every 6 (six) hours as needed for nausea. 20 tablet 0   pantoprazole  (PROTONIX ) 40 MG tablet TAKE 1 TABLET (40 MG TOTAL) BY MOUTH TWICE A DAY BEFORE MEALS 180 tablet 1   phenol (CHLORASEPTIC) 1.4 % LIQD Use as directed 1 spray in the mouth or throat as needed for throat irritation / pain. 177 mL 0   polyethylene glycol powder (GLYCOLAX /MIRALAX ) 17 GM/SCOOP powder Take 17 g by mouth daily. (Patient taking differently: Take 17 g by mouth See admin instructions. Mix 17 grams into a cup of coffee and drink in the morning)     predniSONE  (DELTASONE ) 5 MG tablet Take 1 tablet (5 mg total) by mouth daily with breakfast. 90 tablet 0   tamsulosin  (FLOMAX ) 0.4 MG CAPS capsule TAKE 1 CAPSULE BY MOUTH EVERY DAY AFTER SUPPER 90 capsule 1   Zinc  Sulfate 220 (50 Zn) MG TABS Take 1 tablet (220 mg total) by mouth daily with supper. 100 tablet 0   No current facility-administered medications on file prior to visit.   "

## 2024-06-06 ENCOUNTER — Ambulatory Visit: Payer: Self-pay

## 2024-06-06 ENCOUNTER — Observation Stay (HOSPITAL_COMMUNITY)
Admission: EM | Admit: 2024-06-06 | Discharge: 2024-06-08 | Disposition: A | Attending: Internal Medicine | Admitting: Internal Medicine

## 2024-06-06 ENCOUNTER — Ambulatory Visit: Admitting: Internal Medicine

## 2024-06-06 ENCOUNTER — Encounter (HOSPITAL_COMMUNITY): Payer: Self-pay

## 2024-06-06 ENCOUNTER — Observation Stay (HOSPITAL_COMMUNITY)

## 2024-06-06 ENCOUNTER — Other Ambulatory Visit: Payer: Self-pay

## 2024-06-06 DIAGNOSIS — R2681 Unsteadiness on feet: Secondary | ICD-10-CM | POA: Diagnosis not present

## 2024-06-06 DIAGNOSIS — F418 Other specified anxiety disorders: Secondary | ICD-10-CM | POA: Insufficient documentation

## 2024-06-06 DIAGNOSIS — M0579 Rheumatoid arthritis with rheumatoid factor of multiple sites without organ or systems involvement: Secondary | ICD-10-CM

## 2024-06-06 DIAGNOSIS — G8929 Other chronic pain: Secondary | ICD-10-CM | POA: Diagnosis present

## 2024-06-06 DIAGNOSIS — I214 Non-ST elevation (NSTEMI) myocardial infarction: Principal | ICD-10-CM | POA: Insufficient documentation

## 2024-06-06 DIAGNOSIS — M069 Rheumatoid arthritis, unspecified: Secondary | ICD-10-CM | POA: Diagnosis not present

## 2024-06-06 DIAGNOSIS — M6281 Muscle weakness (generalized): Secondary | ICD-10-CM | POA: Insufficient documentation

## 2024-06-06 DIAGNOSIS — Z7982 Long term (current) use of aspirin: Secondary | ICD-10-CM | POA: Diagnosis not present

## 2024-06-06 DIAGNOSIS — R55 Syncope and collapse: Principal | ICD-10-CM | POA: Insufficient documentation

## 2024-06-06 DIAGNOSIS — I10 Essential (primary) hypertension: Secondary | ICD-10-CM | POA: Insufficient documentation

## 2024-06-06 DIAGNOSIS — G894 Chronic pain syndrome: Secondary | ICD-10-CM | POA: Diagnosis not present

## 2024-06-06 DIAGNOSIS — K219 Gastro-esophageal reflux disease without esophagitis: Secondary | ICD-10-CM

## 2024-06-06 DIAGNOSIS — R269 Unspecified abnormalities of gait and mobility: Secondary | ICD-10-CM | POA: Diagnosis not present

## 2024-06-06 DIAGNOSIS — D638 Anemia in other chronic diseases classified elsewhere: Secondary | ICD-10-CM | POA: Diagnosis present

## 2024-06-06 DIAGNOSIS — Z79899 Other long term (current) drug therapy: Secondary | ICD-10-CM | POA: Diagnosis not present

## 2024-06-06 DIAGNOSIS — G35D Multiple sclerosis, unspecified: Secondary | ICD-10-CM

## 2024-06-06 DIAGNOSIS — R7989 Other specified abnormal findings of blood chemistry: Secondary | ICD-10-CM

## 2024-06-06 DIAGNOSIS — D649 Anemia, unspecified: Secondary | ICD-10-CM | POA: Diagnosis not present

## 2024-06-06 LAB — URINALYSIS, COMPLETE (UACMP) WITH MICROSCOPIC
Bacteria, UA: NONE SEEN
Bilirubin Urine: NEGATIVE
Glucose, UA: NEGATIVE mg/dL
Hgb urine dipstick: NEGATIVE
Ketones, ur: NEGATIVE mg/dL
Leukocytes,Ua: NEGATIVE
Nitrite: NEGATIVE
Protein, ur: NEGATIVE mg/dL
Specific Gravity, Urine: 1.008 (ref 1.005–1.030)
pH: 6 (ref 5.0–8.0)

## 2024-06-06 LAB — TROPONIN T, HIGH SENSITIVITY
Troponin T High Sensitivity: 204 ng/L (ref 0–19)
Troponin T High Sensitivity: 196 ng/L (ref 0–19)
Troponin T High Sensitivity: 197 ng/L (ref 0–19)
Troponin T High Sensitivity: 207 ng/L (ref 0–19)

## 2024-06-06 LAB — COMPREHENSIVE METABOLIC PANEL WITH GFR
ALT: 5 U/L (ref 0–44)
AST: 21 U/L (ref 15–41)
Albumin: 3.2 g/dL — ABNORMAL LOW (ref 3.5–5.0)
Alkaline Phosphatase: 49 U/L (ref 38–126)
Anion gap: 10 (ref 5–15)
BUN: 19 mg/dL (ref 8–23)
CO2: 25 mmol/L (ref 22–32)
Calcium: 8.4 mg/dL — ABNORMAL LOW (ref 8.9–10.3)
Chloride: 105 mmol/L (ref 98–111)
Creatinine, Ser: 0.76 mg/dL (ref 0.44–1.00)
GFR, Estimated: >60 mL/min
Glucose, Bld: 100 mg/dL — ABNORMAL HIGH (ref 70–99)
Potassium: 3.5 mmol/L (ref 3.5–5.1)
Sodium: 140 mmol/L (ref 135–145)
Total Bilirubin: 0.2 mg/dL (ref 0.0–1.2)
Total Protein: 6 g/dL — ABNORMAL LOW (ref 6.5–8.1)

## 2024-06-06 LAB — TSH: TSH: 2.55 u[IU]/mL (ref 0.350–4.500)

## 2024-06-06 LAB — CBC
HCT: 32.8 % — ABNORMAL LOW (ref 36.0–46.0)
Hemoglobin: 10.4 g/dL — ABNORMAL LOW (ref 12.0–15.0)
MCH: 30.5 pg (ref 26.0–34.0)
MCHC: 31.7 g/dL (ref 30.0–36.0)
MCV: 96.2 fL (ref 80.0–100.0)
Platelets: 263 K/uL (ref 150–400)
RBC: 3.41 MIL/uL — ABNORMAL LOW (ref 3.87–5.11)
RDW: 14.6 % (ref 11.5–15.5)
WBC: 7 K/uL (ref 4.0–10.5)
nRBC: 0 % (ref 0.0–0.2)

## 2024-06-06 LAB — PRO BRAIN NATRIURETIC PEPTIDE: Pro Brain Natriuretic Peptide: 1381 pg/mL — ABNORMAL HIGH

## 2024-06-06 LAB — CBG MONITORING, ED: Glucose-Capillary: 83 mg/dL (ref 70–99)

## 2024-06-06 LAB — APTT: aPTT: 35 s (ref 24–36)

## 2024-06-06 LAB — PROTIME-INR
INR: 1 (ref 0.8–1.2)
Prothrombin Time: 14.2 s (ref 11.4–15.2)

## 2024-06-06 MED ORDER — MIRTAZAPINE 7.5 MG PO TABS
7.5000 mg | ORAL_TABLET | Freq: Every day | ORAL | Status: DC
  Administered 2024-06-07: 7.5 mg via ORAL
  Filled 2024-06-06: qty 1

## 2024-06-06 MED ORDER — ONDANSETRON HCL 4 MG/2ML IJ SOLN: 4.0000 mg | Freq: Four times a day (QID) | INTRAMUSCULAR | Status: DC | PRN

## 2024-06-06 MED ORDER — LEVOFLOXACIN 750 MG PO TABS
750.0000 mg | ORAL_TABLET | Freq: Every day | ORAL | Status: DC
  Administered 2024-06-07 – 2024-06-08 (×2): 750 mg via ORAL
  Filled 2024-06-06 (×2): qty 1

## 2024-06-06 MED ORDER — PREDNISONE 5 MG PO TABS
5.0000 mg | ORAL_TABLET | Freq: Every day | ORAL | Status: DC
  Administered 2024-06-07 – 2024-06-08 (×2): 5 mg via ORAL
  Filled 2024-06-06 (×2): qty 1

## 2024-06-06 MED ORDER — VITAMIN D 25 MCG (1000 UNIT) PO TABS
1000.0000 [IU] | ORAL_TABLET | Freq: Every day | ORAL | Status: DC
  Administered 2024-06-07 – 2024-06-08 (×2): 1000 [IU] via ORAL
  Filled 2024-06-06 (×2): qty 1

## 2024-06-06 MED ORDER — POLYETHYLENE GLYCOL 3350 17 G PO PACK: 17.0000 g | PACK | Freq: Every day | ORAL | Status: DC | PRN

## 2024-06-06 MED ORDER — SODIUM CHLORIDE 0.9% FLUSH
3.0000 mL | Freq: Two times a day (BID) | INTRAVENOUS | Status: DC
  Administered 2024-06-07 – 2024-06-08 (×3): 3 mL via INTRAVENOUS

## 2024-06-06 MED ORDER — TAMSULOSIN HCL 0.4 MG PO CAPS
0.4000 mg | ORAL_CAPSULE | Freq: Every day | ORAL | Status: DC
  Administered 2024-06-07 (×2): 0.4 mg via ORAL
  Filled 2024-06-06: qty 1

## 2024-06-06 MED ORDER — POLYVINYL ALCOHOL 1.4 % OP SOLN: 1.0000 [drp] | Freq: Three times a day (TID) | OPHTHALMIC | Status: DC | PRN

## 2024-06-06 MED ORDER — ACETAMINOPHEN 325 MG PO TABS: 650.0000 mg | ORAL_TABLET | Freq: Four times a day (QID) | ORAL | Status: DC | PRN

## 2024-06-06 MED ORDER — DULOXETINE HCL 20 MG PO CPEP
20.0000 mg | ORAL_CAPSULE | Freq: Every day | ORAL | Status: DC
  Administered 2024-06-07 (×2): 20 mg via ORAL
  Filled 2024-06-06 (×2): qty 1

## 2024-06-06 MED ORDER — HEPARIN BOLUS VIA INFUSION
2500.0000 [IU] | Freq: Once | INTRAVENOUS | Status: AC
  Administered 2024-06-06: 2500 [IU] via INTRAVENOUS
  Filled 2024-06-06: qty 2500

## 2024-06-06 MED ORDER — PANTOPRAZOLE SODIUM 40 MG PO TBEC
40.0000 mg | DELAYED_RELEASE_TABLET | Freq: Two times a day (BID) | ORAL | Status: DC
  Administered 2024-06-07 – 2024-06-08 (×4): 40 mg via ORAL
  Filled 2024-06-06 (×4): qty 1

## 2024-06-06 MED ORDER — HYDROCODONE-ACETAMINOPHEN 5-325 MG PO TABS
1.0000 | ORAL_TABLET | Freq: Four times a day (QID) | ORAL | Status: DC | PRN
  Administered 2024-06-07 (×2): 1 via ORAL
  Filled 2024-06-06 (×2): qty 1

## 2024-06-06 MED ORDER — ONDANSETRON HCL 4 MG PO TABS: 4.0000 mg | ORAL_TABLET | Freq: Four times a day (QID) | ORAL | Status: DC | PRN

## 2024-06-06 MED ORDER — DIAZEPAM 5 MG PO TABS
5.0000 mg | ORAL_TABLET | Freq: Every evening | ORAL | Status: DC | PRN
  Administered 2024-06-07 (×2): 5 mg via ORAL
  Filled 2024-06-06 (×2): qty 1

## 2024-06-06 MED ORDER — FOLIC ACID 1 MG PO TABS
1.0000 mg | ORAL_TABLET | Freq: Every day | ORAL | Status: DC
  Administered 2024-06-07 – 2024-06-08 (×2): 1 mg via ORAL
  Filled 2024-06-06 (×2): qty 1

## 2024-06-06 MED ORDER — VITAMIN C 500 MG PO TABS
1000.0000 mg | ORAL_TABLET | Freq: Every day | ORAL | Status: DC
  Administered 2024-06-07 – 2024-06-08 (×2): 1000 mg via ORAL
  Filled 2024-06-06 (×2): qty 2

## 2024-06-06 MED ORDER — HEPARIN (PORCINE) 25000 UT/250ML-% IV SOLN
800.0000 [IU]/h | INTRAVENOUS | Status: DC
  Administered 2024-06-06: 600 [IU]/h via INTRAVENOUS
  Administered 2024-06-07: 800 [IU]/h via INTRAVENOUS
  Filled 2024-06-06 (×2): qty 250

## 2024-06-06 MED ORDER — ACETAMINOPHEN 650 MG RE SUPP: 650.0000 mg | Freq: Four times a day (QID) | RECTAL | Status: DC | PRN

## 2024-06-06 NOTE — ED Triage Notes (Signed)
 Patient BIB [facility] from home. Patient has a home aide. When she stood up she had a syncopal episode. Lasted 3 minutes. The aide caught the patient. She did not fall. Felt light headed before passing out.   EMS 20G left AC fluid

## 2024-06-06 NOTE — H&P (Signed)
 " History and Physical    Patient: Sherri [name]:[id number] DOB: Jun 30, 1940 DOA: 06/06/2024 DOS: the patient was seen and examined on 06/06/2024 PCP: [name] PARAS, MD  Patient coming from: Home  Chief Complaint:  Chief Complaint  Patient presents with   Loss of Consciousness   HPI: Sherri [name] is a 84 y.o. 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  Caregiver hold her before falling.  Patient is wheelchair-bound at baseline, stands up with help with caregivers for transfer.  Today when caregiver stood her up she suddenly became lump, eyes were rolled and she lost consciousness for couple of minutes, no fall as caregiver put her back on the bed.  Patient does not remember anything what happened.  No seizure-like activity, incontinence or tongue bite noted.  Denies any chest pain, shortness of breath, nausea, vomiting or diarrhea.  No other illnesses.  No sick contacts.  Appetite and bowel habit at baseline.  No urinary symptoms.  ED course and data reviewed.  On presentation vital stable, on room air, CBC and CMP mostly unremarkable, troponin elevated at 204>196> 197, proBNP at 1381, UA ordered and pending.  CXR with no acute abnormality, healing right anterior second rib fracture. EKG, personally reviewed, NSR with few premature atrial complexes and no significant ST changes.  Patient was started on heparin  infusion by EDP, cardiology was also notified and [facility] was asked to admit for syncopal workup.   Review of Systems: As mentioned in the history of present illness. All other systems reviewed and are negative. Past Medical History:  Diagnosis Date   Anxiety    takes Valium  daily as needed   Basal cell carcinoma 01/21/1988   left nostril (MOHS), sup-right calf (CX35FU)   Basal cell carcinoma 03/22/1996   right calf (CX35FU)   Basal cell  carcinoma 03/31/1995   left wing nose   Bruises easily    d/t meds   Cancer (HCC)    basal cell ca, in situ- uterine    Cataracts, bilateral    removed bilateral   Chronic back pain    Dizziness    r/t to meds   GERD (gastroesophageal reflux disease)    no meds on a regular basis but will take Tums if needed   Headache(784.0)    r/t neck issues   History of bronchitis 6-25yrs ago   History of colon polyps    benign   HOH (hard of hearing)    wears hearing aids   HOH (hard of hearing)    wears hearing aids    Hyperlipidemia    takes Atorvastatin  on Mondays and Fridays   Hypertension    Joint pain    Joint swelling    Multiple sclerosis    Neuromuscular disorder (HCC)    Dr. FABIENE Crete- [facility], follows [facility]   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   Rheumatoid arthritis (HCC)    Dr Lorrayne weekly, RA- hands- knees- feet    Rheumatoid arthritis(714.0)    Dr Dolphus takes Xeljanz  daily   Right wrist fracture    Skin cancer    Squamous cell carcinoma of skin 11/02/2001   in situ-right knee (cx9fu)   Squamous cell carcinoma of skin 11/12/2011  in situ-left forearm (CX35FU), in situ-left foot (CX35FU)   Squamous cell carcinoma of skin 01/22/2012   in situ-left lower forearm (txpbx)   Squamous cell carcinoma of skin 11/17/2012   right shin (txpbx)   Squamous cell carcinoma of skin 10/28/2013   in situ-right shoulder (CX35FU)   Squamous cell carcinoma of skin 04/28/2014   well diff-right shin (txpbx)   Squamous cell carcinoma of skin 11/02/2014   in situ-Left shin (txpbx)   Squamous cell carcinoma of skin 12/15/2014   in situ-Left hand (txpbx), bowens-left side chest (txpbx)   Squamous cell carcinoma of skin 05/09/2015   in situ-left hand (txpbx)    Squamous cell carcinoma of skin 05/07/2016   in  situ-left inner shin,ant, in situ-left inner shin, post, in situ-left outer forearm, in situ-right knuckle   Squamous cell carcinoma of skin 03/1302018   in situ-left shoulder (txpbx), in situ-right inner shin (txpbx), in situ-top of left foot (txpbx), KA- left forearm (txpbx)   Squamous cell carcinoma of skin 12/02/2016   in situ-left inner shin (txpbx)   Squamous cell carcinoma of skin 03/04/2017   in situ-left outer sup, shin (txpbx), in situ- right 2nd knuckle finger (txpbx), in situ- Left wrist (txpbx)   Squamous cell carcinoma of skin 04/06/2018   in situ-above left knee inner (txpbx)   Squamous cell carcinoma of skin 04/21/2018   in situ-right top hand (txpbx)   Squamous cell carcinoma of skin 07/08/2018   in situ-right lower inner shin (txpbx)   Squamous cell carcinoma of skin 04/27/2019   in situ- Left neck(CX35FU), In situ- right neck (CX35FU)   Urinary retention    sees Dr.Wrenn about 2 times a yr   Past Surgical History:  Procedure Laterality Date   ABDOMINAL HYSTERECTOMY     1985   ANTERIOR CERVICAL DECOMP/DISCECTOMY FUSION N/A 04/23/2022   Procedure: Anterior Cervical Decompression/Discectomy Fusion - Cervical Seven-Thoracic One;  Surgeon: Louis Shove, MD;  Location: [facility];  Service: Neurosurgery;  Laterality: N/A;  3C   ANTERIOR HIP REVISION Right 01/21/2024   Procedure: REVISION, TOTAL ARTHROPLASTY, HIP, ANTERIOR APPROACH;  Surgeon: Fidel Rogue, MD;  Location: [facility];  Service: Orthopedics;  Laterality: Right;   APPENDECTOMY     with TAH   BACK SURGERY     several   BREAST BIOPSY Right    Results were negative   BROW LIFT Bilateral 10/02/2016   Procedure: BILATERAL LOWER LID BLEPHAROPLASTY;  Surgeon: Laurie Loyd Redhead, MD;  Location: [facility];  Service: Plastics;  Laterality: Bilateral;   CARPAL TUNNEL RELEASE Right    cataracts     CERVICAL FUSION     Dr Leeann   CERVICAL FUSION  12/31/2012   Dr Leeann   CERVICAL FUSION  04/2022   CHEST TUBE INSERTION      for traumatic Pneumothorax   COLONOSCOPY  07/16/2010   normal    COLONOSCOPY W/ POLYPECTOMY  1997   negative since; Dr Debrah   ESOPHAGOGASTRODUODENOSCOPY  07/16/2010   normal   eye lid raise     EYE SURGERY Bilateral    cataracts removed - /w IOL   HARDWARE REMOVAL  08/2021   HIP CLOSED REDUCTION Right 11/16/2023   Procedure: CLOSED MANIPULATION, JOINT, HIP;  Surgeon: Ernie Cough, MD;  Location: [facility];  Service: Orthopedics;  Laterality: Right;   HIP CLOSED REDUCTION N/A 11/30/2023   Procedure: CLOSED REDUCTION, HIP;  Surgeon: Fidel Rogue, MD;  Location: [facility];  Service: Orthopedics;  Laterality: N/A;   INCISION  AND DRAINAGE OF WOUND Right 11/28/2023   Procedure: IRRIGATION AND DEBRIDEMENT WOUND; REMOVAL OF HARDWARE;  Surgeon: Edna Toribio LABOR, MD;  Location: [facility];  Service: Orthopedics;  Laterality: Right;  I & D RIGHT ELBOW   JOINT REPLACEMENT     LUMBAR FUSION     Dr Leeann   NASAL SINUS SURGERY     ORIF ELBOW FRACTURE Right 11/20/2023   Procedure: OPEN REDUCTION INTERNAL FIXATION (ORIF) ELBOW/OLECRANON FRACTURE;  Surgeon: [name] Franky SQUIBB, MD;  Location: [facility];  Service: Orthopedics;  Laterality: Right;   POSTERIOR CERVICAL FUSION/FORAMINOTOMY N/A 12/31/2012   Procedure: POSTERIOR LATERAL CERVICAL FUSION/FORAMINOTOMY LEVEL 1 CERVICAL THREE-FOUR WITH LATERAL MASS SCREWS;  Surgeon: Catalina CHRISTELLA Leeann, MD;  Location: [facility];  Service: Neurosurgery;  Laterality: N/A;   PTOSIS REPAIR Bilateral 10/02/2016   Procedure: INTERNAL PTOSIS REPAIR;  Surgeon: Laurie Loyd Redhead, MD;  Location: [facility];  Service: Plastics;  Laterality: Bilateral;   SKIN BIOPSY     THYROID  SURGERY     R lobe removed- 1972, has grown back - CT last done- 2018   TOTAL HIP ARTHROPLASTY  12/22/2011   Procedure: TOTAL HIP ARTHROPLASTY;  Surgeon: Dempsey JINNY Sensor, MD;  Location: [facility];  Service: Orthopedics;  Laterality: Left;   TOTAL HIP ARTHROPLASTY Right 11/12/2023   Procedure: ARTHROPLASTY, HIP, TOTAL,  ANTERIOR APPROACH;  Surgeon: Fidel Rogue, MD;  Location: [facility];  Service: Orthopedics;  Laterality: Right;   TOTAL HIP REVISION Right 12/03/2023   Procedure: TOTAL HIP REVISION;  Surgeon: Fidel Rogue, MD;  Location: [facility];  Service: Orthopedics;  Laterality: Right;  Right acetabular revision Anterior approach   TOTAL SHOULDER ARTHROPLASTY     TUBAL LIGATION     UPPER GASTROINTESTINAL ENDOSCOPY  2012   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   WRIST SURGERY Left 09/16/2022   wrist/arm   Social History:  reports that she quit smoking about 43 years ago. Her smoking use included cigarettes. She started smoking about 63 years ago. She has a 50 pack-year smoking history. She has never been exposed to tobacco smoke. She has never used smokeless tobacco. She reports current alcohol  use of about 14.0 standard drinks of alcohol  per week. She reports that she does not use drugs.  Allergies[1]  Family History  Problem Relation Age of Onset   Cancer Mother        pancreatic   Diabetes Mother    Pancreatic cancer Mother    Heart disease Father        Rheumatic   Cancer Father        ? stomach   Stomach cancer Father    Cancer Sister        stomach   Stomach cancer Sister    Cancer Maternal Aunt        X 4; ? primary   Heart disease Paternal Aunt    Cancer Maternal Grandmother        cervical   Colon cancer Other        Aunts   Multiple sclerosis Daughter    Hypertension Neg Hx    Stroke Neg Hx    Esophageal cancer Neg Hx    Rectal cancer Neg Hx     Prior to Admission medications  Medication Sig Start Date End Date Taking? Authorizing Provider  acetaminophen  (TYLENOL ) 325 MG tablet Take 2 tablets (650 mg total) by mouth every 6 (six) hours as needed for mild pain (pain score 1-3) or fever (or Fever >/=  101). 11/20/23  Yes Djan, Drue DASEN, MD  ascorbic acid  (VITAMIN C ) 1000 MG tablet Take 1 tablet (1,000 mg total) by mouth daily. 12/23/23  Yes Jerilynn Daphne SAILOR, NP  Biotin  5000 MCG  TABS Take 5,000 mcg by mouth in the morning.   Yes [provider]  Carboxymethylcellul-Glycerin  (LUBRICATING EYE DROPS OP) Place 1 drop into both eyes 3 (three) times daily as needed (dry eyes).   Yes [provider]  chlorhexidine  (PERIDEX ) 0.12 % solution Use as directed 15 mLs in the mouth or throat 4 (four) times daily. Patient taking differently: Use as directed 15 mLs in the mouth or throat 4 (four) times daily as needed. 03/29/24  Yes Will Almarie MATSU, MD  denosumab  (PROLIA ) 60 MG/ML SOSY injection Inject 60 mg into the skin every 6 (six) months. Deliver to rheum: [address], [address], [place], [place] [address]. Appt on 07/23/2022 07/17/22 11/26/24 Yes Cheryl Waddell HERO, PA-C  HYDROcodone -acetaminophen  (NORCO/VICODIN) 5-325 MG tablet Take 1 tablet by mouth every 8 (eight) hours as needed for severe pain (pain score 7-10) (Chronic joint pain). 06/02/24   [name] PARAS, MD  Cholecalciferol  (VITAMIN D3) 1000 units CAPS Take 1,000 Units by mouth daily.    [provider]  cyclobenzaprine  (FLEXERIL ) 10 MG tablet TAKE 1 TABLET BY MOUTH THREE TIMES A DAY AS NEEDED FOR MUSCLE SPASM Patient taking differently: Take 10 mg by mouth 3 (three) times daily as needed for muscle spasms. 06/11/23   [name] PARAS, MD  diazepam  (VALIUM ) 5 MG tablet Take 1 tablet (5 mg total) by mouth at bedtime as needed for anxiety. TAKE 1 TABLET BY MOUTH AT BEDTIME AS NEEDED FOR MUSCLE SPASMS. 05/30/24   [name] PARAS, MD  DULoxetine  (CYMBALTA ) 20 MG capsule TAKE 1 CAPSULE BY MOUTH EVERYDAY AT BEDTIME 05/27/24   Webb, Padonda B, FNP  folic acid  (FOLVITE ) 1 MG tablet Take 1 tablet (1 mg total) by mouth daily. 09/02/23   Cheryl Waddell HERO, PA-C  furosemide  (LASIX ) 20 MG tablet Take 0.5 tablets (10 mg total) by mouth daily. 04/18/24   [name] PARAS, MD  Incontinence Supply Disposable (REALITY INCONTINENT BRIEFS SM) MISC UAD for urinary incontinence 04/10/24   [name] PARAS, MD  Lactobacillus (ACIDOPHILUS) CAPS  capsule Take 2 capsules by mouth 3 (three) times daily. 03/12/24   Arlon Carliss ORN, DO  levofloxacin  (LEVAQUIN ) 750 MG tablet Take 1 tablet (750 mg total) by mouth daily. 05/05/24   Fleeta Kathie Jomarie SAILOR, MD  lidocaine  (XYLOCAINE ) 2 % solution Use as directed 15 mLs in the mouth or throat every 3 (three) hours as needed for mouth pain. 03/29/24   Will Almarie MATSU, MD  magic mouthwash (nystatin , lidocaine , diphenhydrAMINE , alum & mag hydroxide) suspension Swish and spit 5 mLs 3 (three) times daily as needed for mouth pain. 03/15/24   [name] PARAS, MD  mirtazapine  (REMERON ) 7.5 MG tablet TAKE 1 TABLET BY MOUTH AT BEDTIME. 05/02/24   Burns, Glade PARAS, MD  Nystatin  (GERHARDT'S BUTT CREAM) CREA Apply 1 Application topically 3 (three) times daily. Patient taking differently: Apply 1 Application topically See admin instructions. Apply as directed to buttocks after every cleansing/drying 01/06/24   [name] PARAS, MD  ondansetron  (ZOFRAN ) 4 MG tablet Take 1 tablet (4 mg total) by mouth every 6 (six) hours as needed for nausea. 03/29/24   Will Almarie MATSU, MD  pantoprazole  (PROTONIX ) 40 MG tablet TAKE 1 TABLET (40 MG TOTAL) BY MOUTH TWICE A DAY BEFORE MEALS 05/04/24  Burns, Glade PARAS, MD  phenol (CHLORASEPTIC) 1.4 % LIQD Use as directed 1 spray in the mouth or throat as needed for throat irritation / pain. 03/29/24   Will Almarie MATSU, MD  polyethylene glycol powder (GLYCOLAX /MIRALAX ) 17 GM/SCOOP powder Take 17 g by mouth daily. Patient taking differently: Take 17 g by mouth See admin instructions. Mix 17 grams into a cup of coffee and drink in the morning 03/12/24   Arlon Carliss ORN, DO  predniSONE  (DELTASONE ) 5 MG tablet Take 1 tablet (5 mg total) by mouth daily with breakfast. 04/25/24   Cheryl Waddell HERO, PA-C  tamsulosin  (FLOMAX ) 0.4 MG CAPS capsule TAKE 1 CAPSULE BY MOUTH EVERY DAY AFTER SUPPER 05/27/24   Webb, Padonda B, FNP  Zinc  Sulfate 220 (50 Zn) MG TABS Take 1 tablet (220 mg total) by mouth daily  with supper. 12/22/23   Jerilynn Daphne SAILOR, NP    Physical Exam: Vitals:   06/06/24 1155 06/06/24 1157 06/06/24 1530 06/06/24 1544  BP: 118/69  (!) 152/75   Pulse: 82  78   Resp: 16  15   Temp: (!) 97.5 F (36.4 C)   98.2 F (36.8 C)  TempSrc: Oral   Oral  SpO2: 96%  95%   Weight:  48 kg    Height:  5' (1.524 m)     General: Vital signs reviewed.  Frail and malnourished elderly lady,, in no acute distress and cooperative with exam.  Head: Normocephalic and atraumatic. Eyes: EOMI, conjunctivae normal, no scleral icterus.  Neck: Supple, trachea midline, normal ROM,  Cardiovascular: RRR, S1 normal, S2 normal, no murmurs, gallops, or rubs. Pulmonary/Chest: Clear to auscultation bilaterally, no wheezes, rales, or rhonchi. Abdominal: Soft, non-tender, non-distended, BS +,  Extremities: No lower extremity edema bilaterally,  pulses symmetric and intact bilaterally.  Neurological: A&O x3, Strength is normal and symmetric bilaterally, cranial nerve II-XII are grossly intact, no focal motor deficit, sensory intact to light touch bilaterally.  Psychiatric: Normal mood and affect.    Data Reviewed: Prior data reviewed as mentioned above.  Assessment and Plan:  Syncope.  Patient likely had a syncopal episode, does not remember anything.  No chest pain.  Clinically appears little dry and she was taking Lasix  at home. - Admit to telemetry - Orthostatic vitals ordered - Echocardiogram  Elevated troponin.  No chest pain or EKG changes, likely demand ischemia with syncopal episode.  EDP started her on heparin  infusion for concern of NSTEMI.  Elevated proBNP at 1381.  Cardiology was consulted. - Continue with heparin  infusion for now - Echocardiogram ordered.  History of rheumatoid arthritis. - Continue home prednisone   Chronic hip hardware infection.  Patient followed up with ID as outpatient and on long-term Levaquin . - Continue home Levaquin  - Continue with pain  management  Depression/anxiety. - Continue home Valium  at night as patient was taking daily - Continue Cymbalta  and Remeron     Advance Care Planning:   Code Status: Full Code.  Discussed with patient and daughter on phone.  Consults: Cardiology  Family Communication: Discussed with caregiver at bedside and daughter on phone.  Severity of Illness: The appropriate patient status for this patient is OBSERVATION. Observation status is judged to be reasonable and necessary in order to provide the required intensity of service to ensure the patient's safety. The patient's presenting symptoms, physical exam findings, and initial radiographic and laboratory data in the context of their medical condition is felt to place them at decreased risk for further clinical deterioration. Furthermore, it is  anticipated that the patient will be medically stable for discharge from the hospital within 2 midnights of admission.   This record has been created using Conservation officer, historic buildings. Errors have been sought and corrected,but may not always be located. Such creation errors do not reflect on the standard of care.   Author: Amaryllis Dare, MD 06/06/2024 6:46 PM  For on call review [email or web address].      [1]  Allergies Allergen Reactions   Ciprofloxacin  Itching, Nausea Only, Other (See Comments), Rash and Dermatitis    Blistering rash and oral ulcers occurred when retried Cipro  and severe mouth pain  Made her sick- stomach hurt. (Also)  ciprofloxacin    Darvon [Propoxyphene Hcl] Shortness Of Breath and Other (See Comments)    ? Dose Related ? Lowered respirations greatly   Demerol [Meperidine] Shortness Of Breath and Other (See Comments)    Respiratory Distress   Penicillins Hives and Other (See Comments)    Has patient had a PCN reaction causing immediate rash, facial/tongue/throat swelling, SOB or lightheadedness with hypotension: No SEVERE RASH INVOLVING MUCUS MEMBRANES or SKIN NECROSIS:  #  #  #  YES  #  #  #  Has patient had a PCN reaction that required hospitalization No Has patient had a PCN reaction occurring within the last 10 years: No.    Imuran [Azathioprine] Other (See Comments)    Weakness   Sulfa  Antibiotics Nausea Only and Other (See Comments)    States had severe indigestion and heartburn and nausea and had to stop taking   Arava [Leflunomide] Other (See Comments)    Excessive weight gain   Lipitor [Atorvastatin ] Nausea And Vomiting    Currently taking 2 times weekly - able to tolerate low dose    Vytorin [Ezetimibe-Simvastatin] Nausea And Vomiting   "

## 2024-06-06 NOTE — ED Provider Triage Note (Signed)
 Emergency Medicine Provider Triage Evaluation Note  Sherri [name] , a 84 y.o. female  was evaluated in triage.  Pt complains of syncope.  Occurred about an hour and a half ago.  She is coming from home with her health aide.  Health aide was there and noted that she had a syncopal episode that lasted about 3 minutes.  The aide was able to catch the patient before she fell.  Patient denies prodrome.  Denies any preceding chest pain shortness of breath or palpitations.  The health aide thought she might have been lightheaded just before the event.  Review of Systems  Positive: See above Negative: See above  Physical Exam  BP 118/69 (BP Location: Left Arm)   Pulse 82   Temp (!) 97.5 F (36.4 C) (Oral)   Resp 16   Ht 5' (1.524 m)   Wt 48 kg   SpO2 96%   BMI 20.67 kg/m  Gen:   Awake, no distress   Resp:  Normal effort  MSK:   Moves extremities without difficulty  Other:    Medical Decision Making  Medically screening exam initiated at 12:22 PM.  Appropriate orders placed.  Sherri [name] was informed that the remainder of the evaluation will be completed by another provider, this initial triage assessment does not replace that evaluation, and the importance of remaining in the ED until their evaluation is complete.  Work up started   Lang Norleen POUR, PA-C 06/06/24 1224

## 2024-06-06 NOTE — ED Provider Notes (Signed)
 " [facility] Provider Note   CSN: [id number] Arrival date & time: 06/06/24  1148     Patient presents with: Loss of Consciousness   Sherri [name] is a 84 y.o. female.  With a history of MS, squamous cell carcinoma of skin, hypertension hyperlipidemia and neuromuscular disorder who presents to the ED after syncopal episode.  Patient home health aide at bedside reports that she was changing position from walker to commode when patient had a witnessed syncopal episode.  No subsequent injury or trauma.  Patient came to in a few seconds and there was no seizure activity.  Patient does not recall the events today.  There was no headaches chest pain shortness of breath and there has not been any recent illness or fevers.    Loss of Consciousness      Prior to Admission medications  Medication Sig Start Date End Date Taking? Authorizing Provider  HYDROcodone -acetaminophen  (NORCO/VICODIN) 5-325 MG tablet Take 1 tablet by mouth every 8 (eight) hours as needed for severe pain (pain score 7-10) (Chronic joint pain). 06/02/24   [name] PARAS, MD  acetaminophen  (TYLENOL ) 325 MG tablet Take 2 tablets (650 mg total) by mouth every 6 (six) hours as needed for mild pain (pain score 1-3) or fever (or Fever >/= 101). 11/20/23   Dorinda Drue DASEN, MD  ascorbic acid  (VITAMIN C ) 1000 MG tablet Take 1 tablet (1,000 mg total) by mouth daily. 12/23/23   Jerilynn Daphne SAILOR, NP  Biotin  5000 MCG TABS Take 10,000 mcg by mouth in the morning.    [provider]  Carboxymethylcellul-Glycerin  (LUBRICATING EYE DROPS OP) Place 1 drop into both eyes 3 (three) times daily as needed (dry eyes).    [provider]  chlorhexidine  (PERIDEX ) 0.12 % solution Use as directed 15 mLs in the mouth or throat 4 (four) times daily. 03/29/24   Will Almarie MATSU, MD  Cholecalciferol  (VITAMIN D3) 1000 units CAPS Take 1,000 Units by mouth daily.    [provider]   cyclobenzaprine  (FLEXERIL ) 10 MG tablet TAKE 1 TABLET BY MOUTH THREE TIMES A DAY AS NEEDED FOR MUSCLE SPASM Patient taking differently: Take 10 mg by mouth 3 (three) times daily as needed for muscle spasms. 06/11/23   [name] PARAS, MD  denosumab  (PROLIA ) 60 MG/ML SOSY injection Inject 60 mg into the skin every 6 (six) months. Deliver to rheum: [address], [place], [place] [address]. Appt on 07/23/2022 07/17/22 11/26/24  Cheryl Waddell HERO, PA-C  diazepam  (VALIUM ) 5 MG tablet Take 1 tablet (5 mg total) by mouth at bedtime as needed for anxiety. TAKE 1 TABLET BY MOUTH AT BEDTIME AS NEEDED FOR MUSCLE SPASMS. 05/30/24   [name] PARAS, MD  DULoxetine  (CYMBALTA ) 20 MG capsule TAKE 1 CAPSULE BY MOUTH EVERYDAY AT BEDTIME 05/27/24   Webb, Padonda B, FNP  folic acid  (FOLVITE ) 1 MG tablet Take 1 tablet (1 mg total) by mouth daily. 09/02/23   Cheryl Waddell HERO, PA-C  furosemide  (LASIX ) 20 MG tablet Take 0.5 tablets (10 mg total) by mouth daily. 04/18/24   [name] PARAS, MD  Incontinence Supply Disposable (REALITY INCONTINENT BRIEFS SM) MISC UAD for urinary incontinence 04/10/24   [name] PARAS, MD  Lactobacillus (ACIDOPHILUS) CAPS capsule Take 2 capsules by mouth 3 (three) times daily. 03/12/24   Arlon Carliss ORN, DO  levofloxacin  (LEVAQUIN ) 750 MG tablet Take 1 tablet (750 mg total) by mouth daily. 05/05/24   Fleeta Rothman, Jomarie  N, MD  lidocaine  (XYLOCAINE ) 2 % solution Use as directed 15 mLs in the mouth or throat every 3 (three) hours as needed for mouth pain. 03/29/24   Will Almarie MATSU, MD  magic mouthwash (nystatin , lidocaine , diphenhydrAMINE , alum & mag hydroxide) suspension Swish and spit 5 mLs 3 (three) times daily as needed for mouth pain. 03/15/24   [name] PARAS, MD  mirtazapine  (REMERON ) 7.5 MG tablet TAKE 1 TABLET BY MOUTH AT BEDTIME. 05/02/24   Burns, Glade PARAS, MD  Nystatin  (GERHARDT'S BUTT CREAM) CREA Apply 1 Application topically 3 (three) times daily. Patient taking differently: Apply 1  Application topically See admin instructions. Apply as directed to buttocks after every cleansing/drying 01/06/24   [name] PARAS, MD  ondansetron  (ZOFRAN ) 4 MG tablet Take 1 tablet (4 mg total) by mouth every 6 (six) hours as needed for nausea. 03/29/24   Will Almarie MATSU, MD  pantoprazole  (PROTONIX ) 40 MG tablet TAKE 1 TABLET (40 MG TOTAL) BY MOUTH TWICE A DAY BEFORE MEALS 05/04/24   Burns, Glade PARAS, MD  phenol (CHLORASEPTIC) 1.4 % LIQD Use as directed 1 spray in the mouth or throat as needed for throat irritation / pain. 03/29/24   Will Almarie MATSU, MD  polyethylene glycol powder (GLYCOLAX /MIRALAX ) 17 GM/SCOOP powder Take 17 g by mouth daily. Patient taking differently: Take 17 g by mouth See admin instructions. Mix 17 grams into a cup of coffee and drink in the morning 03/12/24   Arlon Carliss ORN, DO  predniSONE  (DELTASONE ) 5 MG tablet Take 1 tablet (5 mg total) by mouth daily with breakfast. 04/25/24   Cheryl Waddell HERO, PA-C  tamsulosin  (FLOMAX ) 0.4 MG CAPS capsule TAKE 1 CAPSULE BY MOUTH EVERY DAY AFTER SUPPER 05/27/24   Webb, Padonda B, FNP  Zinc  Sulfate 220 (50 Zn) MG TABS Take 1 tablet (220 mg total) by mouth daily with supper. 12/22/23   Jerilynn Daphne SAILOR, NP    Allergies: Ciprofloxacin , Darvon [propoxyphene hcl], Demerol [meperidine], Penicillins, Imuran [azathioprine], Sulfa  antibiotics, Arava [leflunomide], Lipitor [atorvastatin ], and Vytorin [ezetimibe-simvastatin]    Review of Systems  Cardiovascular:  Positive for syncope.    Updated Vital Signs BP (!) 152/75   Pulse 78   Temp 98.2 F (36.8 C) (Oral)   Resp 15   Ht 5' (1.524 m)   Wt 48 kg   SpO2 95%   BMI 20.67 kg/m   Physical Exam Vitals and nursing note reviewed.  HENT:     Head: Normocephalic and atraumatic.  Eyes:     Pupils: Pupils are equal, round, and reactive to light.  Cardiovascular:     Rate and Rhythm: Normal rate and regular rhythm.  Pulmonary:     Effort: Pulmonary effort is normal.     Breath  sounds: Normal breath sounds.  Abdominal:     Palpations: Abdomen is soft.     Tenderness: There is no abdominal tenderness.  Musculoskeletal:     Cervical back: Neck supple. No tenderness.     Comments: 5 out of 5 motor strength upper lower extremities  Skin:    General: Skin is warm and dry.  Neurological:     General: No focal deficit present.     Mental Status: She is alert and oriented to person, place, and time.     Sensory: No sensory deficit.     Motor: No weakness.  Psychiatric:        Mood and Affect: Mood normal.     (all labs ordered are listed, but  only abnormal results are displayed) Labs Reviewed  COMPREHENSIVE METABOLIC PANEL WITH GFR - Abnormal; Notable for the following components:      Result Value   Glucose, Bld 100 (*)    Calcium  8.4 (*)    Total Protein 6.0 (*)    Albumin  3.2 (*)    All other components within normal limits  CBC - Abnormal; Notable for the following components:   RBC 3.41 (*)    Hemoglobin 10.4 (*)    HCT 32.8 (*)    All other components within normal limits  TROPONIN T, HIGH SENSITIVITY - Abnormal; Notable for the following components:   Troponin T High Sensitivity 204 (*)    All other components within normal limits  TROPONIN T, HIGH SENSITIVITY - Abnormal; Notable for the following components:   Troponin T High Sensitivity 196 (*)    All other components within normal limits  PROTIME-INR  APTT  PRO BRAIN NATRIURETIC PEPTIDE  HEPARIN  LEVEL (UNFRACTIONATED)  TSH  URINALYSIS, COMPLETE (UACMP) WITH MICROSCOPIC  CBC  BASIC METABOLIC PANEL WITH GFR  CBG MONITORING, ED  TROPONIN T, HIGH SENSITIVITY    EKG: EKG Interpretation Date/Time:  Monday June 06 2024 15:27:31 EST Ventricular Rate:  79 PR Interval:  143 QRS Duration:  76 QT Interval:  387 QTC Calculation: 444 R Axis:   17  Text Interpretation: Sinus rhythm Atrial premature complex Low voltage, extremity and precordial leads Confirmed by Pamella Sharper [phone]) on  06/06/2024 4:10:53 PM  Radiology: No results found.   .Critical Care  Performed by: Pamella Sharper LABOR, DO Authorized by: Pamella Sharper LABOR, DO   Critical care provider statement:    Critical care time (minutes):  50   Critical care was necessary to treat or prevent imminent or life-threatening deterioration of the following conditions:  Cardiac failure   Critical care was time spent personally by me on the following activities:  Development of treatment plan with patient or surrogate, discussions with consultants, evaluation of patient's response to treatment, examination of patient, ordering and review of laboratory studies, ordering and review of radiographic studies, ordering and performing treatments and interventions, pulse oximetry, re-evaluation of patient's condition, review of old charts and obtaining history from patient or surrogate   I assumed direction of critical care for this patient from another provider in my specialty: no     Care discussed with: admitting provider      Medications Ordered in the ED  heparin  ADULT infusion 100 units/mL (25000 units/250mL) (600 Units/hr Intravenous New Bag/Given 06/06/24 1614)  sodium chloride  flush (NS) 0.9 % injection 3 mL (has no administration in time range)  acetaminophen  (TYLENOL ) tablet 650 mg (has no administration in time range)    Or  acetaminophen  (TYLENOL ) suppository 650 mg (has no administration in time range)  polyethylene glycol (MIRALAX  / GLYCOLAX ) packet 17 g (has no administration in time range)  ondansetron  (ZOFRAN ) tablet 4 mg (has no administration in time range)    Or  ondansetron  (ZOFRAN ) injection 4 mg (has no administration in time range)  ascorbic acid  (VITAMIN C ) tablet 1,000 mg (has no administration in time range)  Polyethyl Glycol-Propyl Glycol 0.4-0.3 % SOLN (has no administration in time range)  Vitamin D3 CAPS 1,000 Units (has no administration in time range)  diazepam  (VALIUM ) tablet 5 mg (has no  administration in time range)  DULoxetine  (CYMBALTA ) DR capsule 20 mg (has no administration in time range)  folic acid  (FOLVITE ) tablet 1 mg (has no administration in time range)  levofloxacin  (LEVAQUIN ) tablet 750 mg (has no administration in time range)  mirtazapine  (REMERON ) tablet 7.5 mg (has no administration in time range)  pantoprazole  (PROTONIX ) EC tablet 40 mg (has no administration in time range)  predniSONE  (DELTASONE ) tablet 5 mg (has no administration in time range)  tamsulosin  (FLOMAX ) capsule 0.4 mg (has no administration in time range)  heparin  bolus via infusion 2,500 Units (2,500 Units Intravenous Bolus from Bag 06/06/24 1614)    Clinical Course as of 06/06/24 1707  Mon Jun 06, 2024  1540 Message received from nursing staff.  Initial troponin elevated at 204.  No reported chest pain earlier no active chest pain however concern for ACS.  No ST elevation or ischemic changes on EKG.  Will treat with heparin  for NSTEMI and plan for admission [MP]  1610 Discussed with admitting hospitalist who accepts patient for admission. [MP]    Clinical Course User Index [MP] Pamella Ozell LABOR, DO                                 Medical Decision Making 83 year old female presenting after syncopal episode witnessed by home health aide.  No subsequent trauma.  Denies chest pain before the event she had no chest pain or shortness of breath now.  No recent illness no focal neurologic deficits on my exam.  Differential diagnosis for syncopal episode includes ACS, dysrhythmia, electrolyte imbalance, anemia, infection.  Will obtain laboratory workup EKG and continue to monitor for repeat events on telemetry.  Amount and/or Complexity of Data Reviewed Labs: ordered. Radiology: ordered.  Risk Prescription drug management. Decision regarding hospitalization.        Final diagnoses:  NSTEMI (non-ST elevated myocardial infarction) (HCC)  Syncope, unspecified syncope type    ED Discharge  Orders     None          Pamella Ozell LABOR, DO 06/06/24 1707  "

## 2024-06-06 NOTE — ED Notes (Signed)
 Blue top sent to lab save

## 2024-06-06 NOTE — Telephone Encounter (Signed)
 FYI Only or Action Required?: Action required by provider: clinical question for provider.  Patient was last seen in primary care on 04/08/2024 by [name] PARAS, MD.  Called Nurse Triage reporting Advice Only.  Symptoms began today.  Interventions attempted: Nothing.  Symptoms are: unchanged.  Triage Disposition: Call PCP Now  Patient/caregiver understands and will follow disposition?: Yes   Copied from CRM #[id number]. Topic: Clinical - Red Word Triage >> Jun 06, 2024  1:21 PM Thersia BROCKS wrote: Red Word that prompted transfer to Nurse Triage: Patient called in stated she was at the house, passed out, caretaker called 911 she is still waiting in the ER, was scheduled to be seen by pcp today but was canceled wanted to know if she can still come in Reason for Disposition  Call about patient who is currently hospitalized  Answer Assessment - Initial Assessment Questions 1. REASON FOR CALL or QUESTION: What is your reason for calling today? or How can I best     Pt is in the ER and wanting to know if Dr. Geofm can see her at 2pm today instead of her having to wait in the ER. She states she passed out at home, caretaker called 911 and brought her to the ER. She is still waiting and states there are too many people in front of her. Rn advised she is in the right place to be evaluated. Pt stated understanding.  2. CALLER: Document the source of call. (e.g., laboratory staff, caregiver or patient).     Patient  Protocols used: PCP Call - No Triage-A-AH

## 2024-06-06 NOTE — Progress Notes (Signed)
 PHARMACY - ANTICOAGULATION CONSULT NOTE  Pharmacy Consult for heparin  Indication: NSTEMI  Allergies[1]  Patient Measurements: Height: 5' (152.4 cm) Weight: 48 kg (105 lb 13.1 oz) IBW/kg (Calculated) : 45.5 HEPARIN  DW (KG): 48  Vital Signs: Temp: 97.5 F (36.4 C) (01/12 1155) Temp Source: Oral (01/12 1155) BP: 152/75 (01/12 1530) Pulse Rate: 78 (01/12 1530)  Labs: Recent Labs    06/06/24 1235  HGB 10.4*  HCT 32.8*  PLT 263  CREATININE 0.76    Estimated Creatinine Clearance: 38.9 mL/min (by C-G formula based on SCr of 0.76 mg/dL).   Medical History: Past Medical History:  Diagnosis Date   Anxiety    takes Valium  daily as needed   Basal cell carcinoma 01/21/1988   left nostril (MOHS), sup-right calf (CX35FU)   Basal cell carcinoma 03/22/1996   right calf (CX35FU)   Basal cell carcinoma 03/31/1995   left wing nose   Bruises easily    d/t meds   Cancer (HCC)    basal cell ca, in situ- uterine    Cataracts, bilateral    removed bilateral   Chronic back pain    Dizziness    r/t to meds   GERD (gastroesophageal reflux disease)    no meds on a regular basis but will take Tums if needed   Headache(784.0)    r/t neck issues   History of bronchitis 6-65yrs ago   History of colon polyps    benign   HOH (hard of hearing)    wears hearing aids   HOH (hard of hearing)    wears hearing aids    Hyperlipidemia    takes Atorvastatin  on Mondays and Fridays   Hypertension    Joint pain    Joint swelling    Multiple sclerosis    Neuromuscular disorder (HCC)    Dr. FABIENE Crete- [facility], follows [facility]   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   Rheumatoid arthritis (HCC)    Dr Lorrayne weekly, RA- hands- knees- feet    Rheumatoid arthritis(714.0)    Dr Dolphus takes Xeljanz  daily   Right wrist fracture     Skin cancer    Squamous cell carcinoma of skin 11/02/2001   in situ-right knee (cx67fu)   Squamous cell carcinoma of skin 11/12/2011   in situ-left forearm (CX35FU), in situ-left foot (CX35FU)   Squamous cell carcinoma of skin 01/22/2012   in situ-left lower forearm (txpbx)   Squamous cell carcinoma of skin 11/17/2012   right shin (txpbx)   Squamous cell carcinoma of skin 10/28/2013   in situ-right shoulder (CX35FU)   Squamous cell carcinoma of skin 04/28/2014   well diff-right shin (txpbx)   Squamous cell carcinoma of skin 11/02/2014   in situ-Left shin (txpbx)   Squamous cell carcinoma of skin 12/15/2014   in situ-Left hand (txpbx), bowens-left side chest (txpbx)   Squamous cell carcinoma of skin 05/09/2015   in situ-left hand (txpbx)    Squamous cell carcinoma of skin 05/07/2016   in situ-left inner shin,ant, in situ-left inner shin, post, in situ-left outer forearm, in situ-right knuckle   Squamous cell carcinoma of skin 03/1302018   in situ-left shoulder (txpbx), in situ-right inner shin (txpbx), in situ-top of left foot (txpbx), KA- left forearm (txpbx)   Squamous cell carcinoma of skin 12/02/2016   in situ-left inner shin (txpbx)  Squamous cell carcinoma of skin 03/04/2017   in situ-left outer sup, shin (txpbx), in situ- right 2nd knuckle finger (txpbx), in situ- Left wrist (txpbx)   Squamous cell carcinoma of skin 04/06/2018   in situ-above left knee inner (txpbx)   Squamous cell carcinoma of skin 04/21/2018   in situ-right top hand (txpbx)   Squamous cell carcinoma of skin 07/08/2018   in situ-right lower inner shin (txpbx)   Squamous cell carcinoma of skin 04/27/2019   in situ- Left neck(CX35FU), In situ- right neck (CX35FU)   Urinary retention    sees Dr.Wrenn about 2 times a yr    Assessment: 84 yo F with STEMI.  Pharmacy consulted to dose IV heparin . No anticoagulant PTA. Hg 10.4, PLT 263, SCr 0.76 Troponin 204> 196  Goal of Therapy:  Heparin  level  0.3-0.7 units/ml Monitor platelets by anticoagulation protocol: Yes   Plan:  Heparin  bolus 2500 units IV x 1 Heparin  drip 600 units/hr Check 8 hour heparin  level Daily CBC & heparin  level  Rosaline IVAR Edison, Pharm.D Use secure chat for questions 06/06/2024 3:46 PM     [1]  Allergies Allergen Reactions   Ciprofloxacin  Itching, Nausea Only, Other (See Comments), Rash and Dermatitis    Blistering rash and oral ulcers occurred when retried Cipro  and severe mouth pain  Made her sick- stomach hurt. (Also)  ciprofloxacin    Darvon [Propoxyphene Hcl] Shortness Of Breath and Other (See Comments)    ? Dose Related ? Lowered respirations greatly   Demerol [Meperidine] Shortness Of Breath and Other (See Comments)    Respiratory Distress   Penicillins Hives and Other (See Comments)    Has patient had a PCN reaction causing immediate rash, facial/tongue/throat swelling, SOB or lightheadedness with hypotension: No SEVERE RASH INVOLVING MUCUS MEMBRANES or SKIN NECROSIS: #  #  #  YES  #  #  #  Has patient had a PCN reaction that required hospitalization No Has patient had a PCN reaction occurring within the last 10 years: No.    Imuran [Azathioprine] Other (See Comments)    Weakness   Sulfa  Antibiotics Nausea Only and Other (See Comments)    States had severe indigestion and heartburn and nausea and had to stop taking   Arava [Leflunomide] Other (See Comments)    Excessive weight gain   Lipitor [Atorvastatin ] Nausea And Vomiting    Currently taking 2 times weekly - able to tolerate low dose    Vytorin [Ezetimibe-Simvastatin] Nausea And Vomiting

## 2024-06-07 ENCOUNTER — Observation Stay (HOSPITAL_COMMUNITY)

## 2024-06-07 DIAGNOSIS — I10 Essential (primary) hypertension: Secondary | ICD-10-CM

## 2024-06-07 DIAGNOSIS — E785 Hyperlipidemia, unspecified: Secondary | ICD-10-CM | POA: Diagnosis not present

## 2024-06-07 DIAGNOSIS — R55 Syncope and collapse: Secondary | ICD-10-CM | POA: Diagnosis not present

## 2024-06-07 LAB — CBC
HCT: 32.5 % — ABNORMAL LOW (ref 36.0–46.0)
Hemoglobin: 10.5 g/dL — ABNORMAL LOW (ref 12.0–15.0)
MCH: 30.7 pg (ref 26.0–34.0)
MCHC: 32.3 g/dL (ref 30.0–36.0)
MCV: 95 fL (ref 80.0–100.0)
Platelets: 279 K/uL (ref 150–400)
RBC: 3.42 MIL/uL — ABNORMAL LOW (ref 3.87–5.11)
RDW: 14.6 % (ref 11.5–15.5)
WBC: 4.9 K/uL (ref 4.0–10.5)
nRBC: 0 % (ref 0.0–0.2)

## 2024-06-07 LAB — BASIC METABOLIC PANEL WITH GFR
Anion gap: 9 (ref 5–15)
BUN: 19 mg/dL (ref 8–23)
CO2: 29 mmol/L (ref 22–32)
Calcium: 9.2 mg/dL (ref 8.9–10.3)
Chloride: 102 mmol/L (ref 98–111)
Creatinine, Ser: 0.79 mg/dL (ref 0.44–1.00)
GFR, Estimated: >60 mL/min
Glucose, Bld: 81 mg/dL (ref 70–99)
Potassium: 3.6 mmol/L (ref 3.5–5.1)
Sodium: 140 mmol/L (ref 135–145)

## 2024-06-07 LAB — ECHOCARDIOGRAM COMPLETE
Area-P 1/2: 2.91 cm2
Height: 60 in
MV M vel: 5.1 m/s
MV Peak grad: 104 mmHg
S' Lateral: 2.2 cm
Weight: 1693.13 [oz_av]

## 2024-06-07 LAB — HEPARIN LEVEL (UNFRACTIONATED)
Heparin Unfractionated: <0.1 [IU]/mL — ABNORMAL LOW (ref 0.30–0.70)
Heparin Unfractionated: 0.3 [IU]/mL (ref 0.30–0.70)

## 2024-06-07 MED ORDER — HEPARIN BOLUS VIA INFUSION
1000.0000 [IU] | Freq: Once | INTRAVENOUS | Status: AC
  Administered 2024-06-07: 1000 [IU] via INTRAVENOUS
  Filled 2024-06-07: qty 1000

## 2024-06-07 NOTE — Hospital Course (Signed)
 Sherri  [name] is a 84 y.o. 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  Caregiver hold her before falling.   Patient is wheelchair-bound at baseline, stands up with help with caregivers for transfer.  Today when caregiver stood her up she suddenly became lump, eyes were rolled and she lost consciousness for couple of minutes, no fall as caregiver put her back on the bed.  Patient does not remember anything what happened.  No seizure-like activity, incontinence or tongue bite noted.  Denies any chest pain, shortness of breath, nausea, vomiting or diarrhea.  No other illnesses.  No sick contacts.  Appetite and bowel habit at baseline.  No urinary symptoms.   ED course and data reviewed.  On presentation vital stable, on room air, CBC and CMP mostly unremarkable, troponin elevated at 204>196> 197, proBNP at 1381, UA ordered and pending.  CXR with no acute abnormality, healing right anterior second rib fracture. EKG, personally reviewed, NSR with few premature atrial complexes and no significant ST changes.   Patient was started on heparin  infusion by EDP, cardiology was also notified and [facility] was asked to admit for syncopal workup.

## 2024-06-07 NOTE — Evaluation (Signed)
 Occupational Therapy Evaluation Patient Details Name: Sherri [name] MRN: [id number] DOB: 1940/09/27 Today's Date: 06/07/2024   History of Present Illness   84 yr old female brought to the hospital after a syncopal episode at home. PMH: rheumatoid arthritis, MS, hyperlipidemia, multiple orthopedic surgeries or revisions to right hip arthroplasty, chronic osteomyelitis     Clinical Impressions The pt is currently limited by the below listed deficits (see OT problem list). As such, her ADL performance is compromised and she is requiring assistance for self-care management. Prior to around May 2025, she was modified independent to independent with ADLs; since then, she has been requiring assistance for bathing, dressing and toileting. Her daughter or aides have been managing the cooking and cleaning. During the session today, she reported chronic generalized pain she attributes to a history of RA. She required min assist for supine to sit, max assist for lower body dressing, and mod assist to stand & take lateral steps along the EOB. She has been receiving home health OT and PT, and she reported having 24/7 assistance in the home between her caregivers and daughter. OT will continue to follow her for services in the acute care setting to maximize her ADL performance and to decrease the risk for restricted participation in meaningful activities. OT recommends the resumption of home health OT and in-home caregiver services at discharge.      If plan is discharge home, recommend the following:   A lot of help with walking and/or transfers;A lot of help with bathing/dressing/bathroom;Assistance with cooking/housework;Help with stairs or ramp for entrance;Assist for transportation     Functional Status Assessment   Patient has had a recent decline in their functional status and demonstrates the ability to make significant improvements in function in a reasonable and predictable amount of time.      Equipment Recommendations   None recommended by OT     Recommendations for Other Services         Precautions/Restrictions   Precautions Precautions: Fall Precaution/Restrictions Comments:  (monitor blood pressure) Restrictions Weight Bearing Restrictions Per Provider Order: No Other Position/Activity Restrictions: multiple R hip surgeries in 2025 with the last one appearing to be in November, pt reported using a hinged R hip abduction brace at night, pt also reported MD instructed her to not attempt ambulating yet due to R hip incision draining     Mobility Bed Mobility Overal bed mobility: Needs Assistance Bed Mobility: Supine to Sit, Sit to Supine     Supine to sit: Min assist Sit to supine: Mod assist   General bed mobility comments: assist trunk and legs    Transfers Overall transfer level: Needs assistance     Sit to Stand: Mod assist, From elevated surface           Balance     Sitting balance-Leahy Scale: Fair       Standing balance-Leahy Scale: Poor         ADL either performed or assessed with clinical judgement   ADL Overall ADL's : Needs assistance/impaired Eating/Feeding: Set up;Sitting Eating/Feeding Details (indicate cue type and reason): intermittent assist to open packets, remove lids and cut foods, given chronic arthritic changes of hands and associated fine motor coordination deficits Grooming: Set up;Sitting   Upper Body Bathing: Set up;Supervision/ safety;Sitting   Lower Body Bathing: Moderate assistance;Sitting/lateral leans   Upper Body Dressing : Minimal assistance;Sitting   Lower Body Dressing: Maximal assistance;Sitting/lateral leans;Sit to/from stand   Toilet Transfer: Moderate assistance;BSC/3in1;Rolling walker (2 wheels);Stand-pivot;+2 for  physical assistance   Toileting- Clothing Manipulation and Hygiene: Maximal assistance;Sit to/from stand;Cueing for sequencing;Cueing for compensatory techniques Toileting -  Clothing Manipulation Details (indicate cue type and reason): at bedside commode level, based on clinical judgement             Vision   Additional Comments: She correctly read the time depicted on the wall clock.            Pertinent Vitals/Pain Pain Assessment Pain Assessment: 0-10 Pain Score: 6  Pain Location: chronic generalized pain she attributes to her history of RA Pain Intervention(s): Monitored during session, Limited activity within patient's tolerance, Repositioned     Extremity/Trunk Assessment Upper Extremity Assessment Upper Extremity Assessment: LUE deficits/detail;RUE deficits/detail RUE Deficits / Details: Shoulder and elbow AROM WFL. Chronic arthritic changes of hand, due to RA. Able to demo gross hand grasp, however refined grasp is limited. RUE Coordination: decreased fine motor LUE Deficits / Details: Shoulder and elbow AROM WFL. Chronic arthritic changes of hand, due to RA. Able to demo gross hand grasp, however refined grasp is limited. LUE Coordination: decreased fine motor   Lower Extremity Assessment Lower Extremity Assessment: Generalized weakness;RLE deficits/detail;LLE deficits/detail RLE Deficits / Details:  (Knee and ankle AROM WFL) LLE Deficits / Details: Knee and ankle AROM WFL   Cervical / Trunk Assessment Cervical / Trunk Assessment: Kyphotic   Communication Communication Communication: Impaired Factors Affecting Communication: Hearing impaired   Cognition Arousal: Alert Behavior During Therapy: WFL for tasks assessed/performed Cognition: No apparent impairments             OT - Cognition Comments: Oriented x4                 Following commands: Intact       Cueing  General Comments   Cueing Techniques: Verbal cues              Home Living Family/patient expects to be discharged to:: Private residence Living Arrangements: Spouse/significant other Available Help at Discharge: Family;Personal care  attendant;Available 24 hours/day Type of Home: House Home Access: Ramped entrance     Home Layout: Able to live on main level with bedroom/bathroom;Two level Alternate Level Stairs-Number of Steps: her bedroom and full bathroom are on the main level of the home   Bathroom Shower/Tub: Producer, Television/film/video: Handicapped height Bathroom Accessibility: Yes   Home Equipment: Shower seat - built in;BSC/3in1;Transport chair;Other (comment) (Adjustable bed)   Additional Comments:  (Pt was receiving home health OT & PT, she reported having 24/7 in-home care between her daughter and in-home caregivers)      Prior Functioning/Environment Prior Level of Function : Needs assist;History of Falls (last six months)      Mobility Comments:  (For the past several months, pt has been requiring assist to perform stand-pivot transfers into and out of a wheelchair. Prior to around May 2025, she was ambulatory, however has not been able to ambulate due to her R hip & multiple surgeries last year.) ADLs Comments:  (Prior to around May 2025, she was modified independent to independent with ADLs. Since then, she has been requiring assistance for bathing, dressing and toileting. Her daughter or aides have been managing the cooking and cleaning.)    OT Problem List: Decreased strength;Impaired balance (sitting and/or standing);Decreased coordination;Pain;Decreased activity tolerance   OT Treatment/Interventions: Self-care/ADL training;Therapeutic exercise;Energy conservation;DME and/or AE instruction;Therapeutic activities;Balance training;Patient/family education      OT Goals(Current goals can be found in the care plan section)  Acute Rehab OT Goals Patient Stated Goal: to return home soon OT Goal Formulation: With patient Time For Goal Achievement: 06/21/24 Potential to Achieve Goals: Good ADL Goals Pt Will Perform Upper Body Dressing: with set-up;sitting Pt Will Perform Lower Body  Dressing: with min assist;sitting/lateral leans;sit to/from stand Pt Will Transfer to Toilet: with contact guard assist;bedside commode;stand pivot transfer Pt Will Perform Toileting - Clothing Manipulation and hygiene: with contact guard assist;sit to/from stand Additional ADL Goal #1: The pt will perform bed mobility with CGA, in prep for progressive ADL participation.   OT Frequency:       Co-evaluation PT/OT/SLP Co-Evaluation/Treatment: Yes Reason for Co-Treatment: For patient/therapist safety;To address functional/ADL transfers PT goals addressed during session: Mobility/safety with mobility OT goals addressed during session: ADL's and self-care      AM-PAC OT 6 Clicks Daily Activity     Outcome Measure Help from another person eating meals?: A Little Help from another person taking care of personal grooming?: A Little Help from another person toileting, which includes using toliet, bedpan, or urinal?: A Lot Help from another person bathing (including washing, rinsing, drying)?: A Lot Help from another person to put on and taking off regular upper body clothing?: A Little Help from another person to put on and taking off regular lower body clothing?: A Lot 6 Click Score: 15   End of Session Equipment Utilized During Treatment: Oxygen Nurse Communication: Mobility status  Activity Tolerance: Patient tolerated treatment well Patient left: in bed;with call bell/phone within reach  OT Visit Diagnosis: Unsteadiness on feet (R26.81);Other abnormalities of gait and mobility (R26.89);Muscle weakness (generalized) (M62.81);Pain Pain - part of body:  (generalized due to a history of RA)                Time: 1115-1150 OT Time Calculation (min): 35 min Charges:  OT General Charges $OT Visit: 1 Visit OT Evaluation $OT Eval Moderate Complexity: 1 Mod    Rylan Kaufmann J Harris, OTR/L 06/07/2024, 2:28 PM

## 2024-06-07 NOTE — Progress Notes (Signed)
 PHARMACY - ANTICOAGULATION CONSULT NOTE  Pharmacy Consult for heparin  Indication: NSTEMI  Allergies[1]  Patient Measurements: Height: 5' (152.4 cm) Weight: 48 kg (105 lb 13.1 oz) IBW/kg (Calculated) : 45.5 HEPARIN  DW (KG): 48  Vital Signs: Temp: 98 F (36.7 C) (01/13 0027) Temp Source: Oral (01/13 0027) BP: 155/88 (01/13 0027) Pulse Rate: 90 (01/13 0027)  Labs: Recent Labs    06/06/24 1235 06/06/24 1547 06/07/24 0052  HGB 10.4*  --  10.5*  HCT 32.8*  --  32.5*  PLT 263  --  279  APTT  --  35  --   LABPROT  --  14.2  --   INR  --  1.0  --   HEPARINUNFRC  --   --  <0.10*  CREATININE 0.76  --  0.79    Estimated Creatinine Clearance: 38.9 mL/min (by C-G formula based on SCr of 0.79 mg/dL).   Medical History: Past Medical History:  Diagnosis Date   Anxiety    takes Valium  daily as needed   Basal cell carcinoma 01/21/1988   left nostril (MOHS), sup-right calf (CX35FU)   Basal cell carcinoma 03/22/1996   right calf (CX35FU)   Basal cell carcinoma 03/31/1995   left wing nose   Bruises easily    d/t meds   Cancer (HCC)    basal cell ca, in situ- uterine    Cataracts, bilateral    removed bilateral   Chronic back pain    Dizziness    r/t to meds   GERD (gastroesophageal reflux disease)    no meds on a regular basis but will take Tums if needed   Headache(784.0)    r/t neck issues   History of bronchitis 6-81yrs ago   History of colon polyps    benign   HOH (hard of hearing)    wears hearing aids   HOH (hard of hearing)    wears hearing aids    Hyperlipidemia    takes Atorvastatin  on Mondays and Fridays   Hypertension    Joint pain    Joint swelling    Multiple sclerosis    Neuromuscular disorder (HCC)    Dr. FABIENE Crete- [facility], follows [facility]   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   Rheumatoid  arthritis (HCC)    Dr Lorrayne weekly, RA- hands- knees- feet    Rheumatoid arthritis(714.0)    Dr Dolphus takes Xeljanz  daily   Right wrist fracture    Skin cancer    Squamous cell carcinoma of skin 11/02/2001   in situ-right knee (cx38fu)   Squamous cell carcinoma of skin 11/12/2011   in situ-left forearm (CX35FU), in situ-left foot (CX35FU)   Squamous cell carcinoma of skin 01/22/2012   in situ-left lower forearm (txpbx)   Squamous cell carcinoma of skin 11/17/2012   right shin (txpbx)   Squamous cell carcinoma of skin 10/28/2013   in situ-right shoulder (CX35FU)   Squamous cell carcinoma of skin 04/28/2014   well diff-right shin (txpbx)   Squamous cell carcinoma of skin 11/02/2014   in situ-Left shin (txpbx)   Squamous cell carcinoma of skin 12/15/2014   in situ-Left hand (txpbx), bowens-left side chest (txpbx)   Squamous cell carcinoma of skin 05/09/2015   in situ-left hand (txpbx)    Squamous cell carcinoma of skin 05/07/2016   in situ-left inner shin,ant, in situ-left  inner shin, post, in situ-left outer forearm, in situ-right knuckle   Squamous cell carcinoma of skin 03/1302018   in situ-left shoulder (txpbx), in situ-right inner shin (txpbx), in situ-top of left foot (txpbx), KA- left forearm (txpbx)   Squamous cell carcinoma of skin 12/02/2016   in situ-left inner shin (txpbx)   Squamous cell carcinoma of skin 03/04/2017   in situ-left outer sup, shin (txpbx), in situ- right 2nd knuckle finger (txpbx), in situ- Left wrist (txpbx)   Squamous cell carcinoma of skin 04/06/2018   in situ-above left knee inner (txpbx)   Squamous cell carcinoma of skin 04/21/2018   in situ-right top hand (txpbx)   Squamous cell carcinoma of skin 07/08/2018   in situ-right lower inner shin (txpbx)   Squamous cell carcinoma of skin 04/27/2019   in situ- Left neck(CX35FU), In situ- right neck (CX35FU)   Urinary retention    sees Dr.Wrenn about 2 times a yr    Assessment: 84  yo F with STEMI.  Pharmacy consulted to dose IV heparin . No anticoagulant PTA. Baseline labs: Hg 10.4, PLT 263, SCr 0.76, Troponin 204> 196  06/07/2024: Initial heparin  level <0.1- subtherapeutic on IV heparin  600 units/hr CBC: Hg slightly low/stable, pltc WNL No bleeding or infusion related concerns reported by RN  Goal of Therapy:  Heparin  level 0.3-0.7 units/ml Monitor platelets by anticoagulation protocol: Yes   Plan:  Re-bolus IV Heparin  1000 units IV x 1 Increase Heparin  drip to 800 units/hr Check 8 hour heparin  level after rate increase  Daily CBC & heparin  level  Rosaline Millet, PharmD, BCPS 06/07/2024 1:36 AM      [1]  Allergies Allergen Reactions   Ciprofloxacin  Itching, Nausea Only, Other (See Comments), Rash and Dermatitis    Blistering rash and oral ulcers occurred when retried Cipro  and severe mouth pain  Made her sick- stomach hurt. (Also)  ciprofloxacin    Darvon [Propoxyphene Hcl] Shortness Of Breath and Other (See Comments)    ? Dose Related ? Lowered respirations greatly   Demerol [Meperidine] Shortness Of Breath and Other (See Comments)    Respiratory Distress   Penicillins Hives and Other (See Comments)    Has patient had a PCN reaction causing immediate rash, facial/tongue/throat swelling, SOB or lightheadedness with hypotension: No SEVERE RASH INVOLVING MUCUS MEMBRANES or SKIN NECROSIS: #  #  #  YES  #  #  #  Has patient had a PCN reaction that required hospitalization No Has patient had a PCN reaction occurring within the last 10 years: No.    Imuran [Azathioprine] Other (See Comments)    Weakness   Sulfa  Antibiotics Nausea Only and Other (See Comments)    States had severe indigestion and heartburn and nausea and had to stop taking   Arava [Leflunomide] Other (See Comments)    Excessive weight gain   Lipitor [Atorvastatin ] Nausea And Vomiting    Currently taking 2 times weekly - able to tolerate low dose    Vytorin  [Ezetimibe-Simvastatin] Nausea And Vomiting

## 2024-06-07 NOTE — Progress Notes (Signed)
 PHARMACY - ANTICOAGULATION CONSULT NOTE  Pharmacy Consult for heparin  Indication: NSTEMI  Allergies[1]  Patient Measurements: Height: 5' (152.4 cm) Weight: 48 kg (105 lb 13.1 oz) IBW/kg (Calculated) : 45.5 HEPARIN  DW (KG): 48  Vital Signs: Temp: 97.9 F (36.6 C) (01/13 0944) Temp Source: Oral (01/13 0944) BP: 134/62 (01/13 1200) Pulse Rate: 80 (01/13 1200)  Labs: Recent Labs    06/06/24 1235 06/06/24 1547 06/07/24 0052 06/07/24 1025  HGB 10.4*  --  10.5*  --   HCT 32.8*  --  32.5*  --   PLT 263  --  279  --   APTT  --  35  --   --   LABPROT  --  14.2  --   --   INR  --  1.0  --   --   HEPARINUNFRC  --   --  <0.10* 0.30  CREATININE 0.76  --  0.79  --     Estimated Creatinine Clearance: 38.9 mL/min (by C-G formula based on SCr of 0.79 mg/dL).   Assessment: 84 yo F with STEMI.  Pharmacy consulted to dose IV heparin . No anticoagulant PTA. Baseline labs: Hg 10.4, PLT 263, SCr 0.76, Troponin 204> 196  06/07/2024:  heparin  level 0.3  - therapeutic after heparin  1000 unit bolus & rate increase to 800 units/hr CBC: Hg slightly low/stable, pltc WNL No bleeding or infusion related concerns reported by RN  Goal of Therapy:  Heparin  level 0.3-0.7 units/ml Monitor platelets by anticoagulation protocol: Yes   Plan:  Continue heparin  drip @ 800 units/hr Daily CBC & heparin  level F/u cardiology work up> anticipate heparin  to be stopped if echo is negative   Rosaline IVAR Edison, Pharm.D Use secure chat for questions 06/07/2024 12:14 PM     [1]  Allergies Allergen Reactions   Ciprofloxacin  Itching, Nausea Only, Other (See Comments), Rash and Dermatitis    Blistering rash and oral ulcers occurred when retried Cipro  and severe mouth pain  Made her sick- stomach hurt. (Also)  ciprofloxacin    Darvon [Propoxyphene Hcl] Shortness Of Breath and Other (See Comments)    ? Dose Related ? Lowered respirations greatly   Demerol [Meperidine] Shortness Of Breath and Other  (See Comments)    Respiratory Distress   Penicillins Hives and Other (See Comments)    Has patient had a PCN reaction causing immediate rash, facial/tongue/throat swelling, SOB or lightheadedness with hypotension: No SEVERE RASH INVOLVING MUCUS MEMBRANES or SKIN NECROSIS: #  #  #  YES  #  #  #  Has patient had a PCN reaction that required hospitalization No Has patient had a PCN reaction occurring within the last 10 years: No.    Imuran [Azathioprine] Other (See Comments)    Weakness   Sulfa  Antibiotics Nausea Only and Other (See Comments)    States had severe indigestion and heartburn and nausea and had to stop taking   Arava [Leflunomide] Other (See Comments)    Excessive weight gain   Lipitor [Atorvastatin ] Nausea And Vomiting    Currently taking 2 times weekly - able to tolerate low dose    Vytorin [Ezetimibe-Simvastatin] Nausea And Vomiting

## 2024-06-07 NOTE — Evaluation (Signed)
 " Physical Therapy Evaluation Patient Details Name: Sherri [name] MRN: [id number] DOB: Jun 23, 1940 Today's Date: 06/07/2024  History of Present Illness  84 y.o. female PMH: rheumatoid arthritis, MS, hyperlipidemia, multiple orthopedic surgeries including right hip arthroplasty who has undergone multiple surgical revision of her right hip arthroplasty, has chronic osteomyelitis and is now being admitted to the hospital with syncopal episode at home  Clinical Impression  Pt admitted with above diagnosis.  Pt currently with functional limitations due to the deficits listed below (see PT Problem List). Pt will benefit from acute skilled PT to increase their independence and safety with mobility to allow discharge.     The patient reports that she  resides with spouse  and 24/7 caregivers, is transfers only to a transport chair per ortho due to multiple Right hip surgeries.  Patient did mobilize with  therapy with mod assist. Orthostatic BP's taken and placed in flow sheets.  Patient plans to return home with caregiver support.Recommend continuing HHPT as  Patient reports having HHPT onboard. Orthostatic BP Sup 142/74,sitting 124/67, standing 108/57, 3 after sitting back down as pt could not maintain standing 128/76.Reports no dizziness.       If plan is discharge home, recommend the following: A lot of help with walking and/or transfers;Help with stairs or ramp for entrance;Assistance with cooking/housework;Assist for transportation;A little help with bathing/dressing/bathroom   Can travel by private vehicle        Equipment Recommendations None recommended by PT  Recommendations for Other Services       Functional Status Assessment Patient has had a recent decline in their functional status and/or demonstrates limited ability to make significant improvements in function in a reasonable and predictable amount of time     Precautions / Restrictions Precautions Precautions:  Fall Precaution/Restrictions Comments: monitor BP, is  for trnasfers only non ambulatory per Dr. Fidel per patient due to right hip incision draining Required Braces or Orthoses: Other Brace Other Brace: pt reports use of hip abduction brace at night per Dr. Fidel due to multiple right hip surgeries.( not with pt.) Restrictions Weight Bearing Restrictions Per Provider Order: No      Mobility  Bed Mobility Overal bed mobility: Needs Assistance Bed Mobility: Supine to Sit, Sit to Supine     Supine to sit: Min assist Sit to supine: Mod assist   General bed mobility comments: assist trunk and legs    Transfers Overall transfer level: Needs assistance   Transfers: Sit to/from Stand Sit to Stand: Mod assist, From elevated surface           General transfer comment: stood with bil hand hold, side steps x 4 x 2 trials, antalgic on the L more so  than right.    Ambulation/Gait                  Stairs            Wheelchair Mobility     Tilt Bed    Modified Rankin (Stroke Patients Only)       Balance Overall balance assessment: Needs assistance Sitting-balance support: Bilateral upper extremity supported, Feet unsupported Sitting balance-Leahy Scale: Fair     Standing balance support: During functional activity, Bilateral upper extremity supported Standing balance-Leahy Scale: Poor                               Pertinent Vitals/Pain Pain Assessment Pain Assessment: Faces Faces Pain Scale: Hurts  little more Pain Location: generalized Pain Descriptors / Indicators: Discomfort, Grimacing, Aching Pain Intervention(s): Monitored during session    Home Living Family/patient expects to be discharged to:: Private residence Living Arrangements: Spouse/significant other Available Help at Discharge: Family;Available PRN/intermittently;Available 24 hours/day;Personal care attendant Type of Home: House Home Access: Ramped entrance        Home Layout: Able to live on main level with bedroom/bathroom Home Equipment: Rollator (4 wheels);BSC/3in1;Shower seat - built in;Grab bars - tub/shower;Rolling Walker (2 wheels);Other (comment);Transport chair Additional Comments: Per dtr, 24/7 caregivers hired or family.  Dtr is retired [employer], Hip abd brace, HHPT    Prior Function Prior Level of Function : Needs assist;History of Falls (last six months)             Mobility Comments: After fall in July, performs stand-pivots with assist to [facility] using RW. Assist to propel WC. Prior to May, was ambulating with no AD. At least 4 falls ADLs Comments: Has been doing bed baths after fall in July with assist for all ADLs and IADLs. Prior to admission in May 2025, pt was Ind with ADLs and IADLs per chart review.     Extremity/Trunk Assessment        Lower Extremity Assessment Lower Extremity Assessment: RLE deficits/detail RLE Deficits / Details: dressing over anterior  hip, both LE ecchymosis    Cervical / Trunk Assessment Cervical / Trunk Assessment: Kyphotic  Communication   Communication Communication: Impaired Factors Affecting Communication: Hearing impaired    Cognition Arousal: Alert Behavior During Therapy: WFL for tasks assessed/performed   PT - Cognitive impairments: No apparent impairments                         Following commands: Intact       Cueing Cueing Techniques: Verbal cues     General Comments      Exercises     Assessment/Plan    PT Assessment Patient needs continued PT services  PT Problem List Decreased strength;Decreased activity tolerance;Decreased balance;Decreased mobility;Pain;Decreased skin integrity       PT Treatment Interventions Functional mobility training;Therapeutic activities;Therapeutic exercise;Patient/family education    PT Goals (Current goals can be found in the Care Plan section)  Acute Rehab PT Goals Patient Stated Goal: I am not supposed to walk, go home  today PT Goal Formulation: With patient Time For Goal Achievement: 06/21/24 Potential to Achieve Goals: Fair    Frequency Min 2X/week     Co-evaluation PT/OT/SLP Co-Evaluation/Treatment: Yes Reason for Co-Treatment: Complexity of the patient's impairments (multi-system involvement);For patient/therapist safety PT goals addressed during session: Mobility/safety with mobility         AM-PAC PT 6 Clicks Mobility  Outcome Measure Help needed turning from your back to your side while in a flat bed without using bedrails?: A Lot Help needed moving from lying on your back to sitting on the side of a flat bed without using bedrails?: A Lot Help needed moving to and from a bed to a chair (including a wheelchair)?: A Lot Help needed standing up from a chair using your arms (e.g., wheelchair or bedside chair)?: A Lot Help needed to walk in hospital room?: Total Help needed climbing 3-5 steps with a railing? : Total 6 Click Score: 10    End of Session   Activity Tolerance: Patient tolerated treatment well Patient left: in bed;with call bell/phone within reach Nurse Communication: Mobility status PT Visit Diagnosis: Unsteadiness on feet (R26.81);Pain Pain - Right/Left: Right Pain -  part of body: Hip    Time: 8894-8848 PT Time Calculation (min) (ACUTE ONLY): 46 min   Charges:   PT Evaluation $PT Eval Low Complexity: 1 Low PT Treatments $Therapeutic Activity: 8-22 mins PT General Charges $$ ACUTE PT VISIT: 1 Visit         Darice Potters PT Acute Rehabilitation Services Office [phone]   Potters Darice Norris 06/07/2024, 1:50 PM "

## 2024-06-07 NOTE — Consult Note (Addendum)
 "  Cardiology Consultation   Patient ID: Sherri  MATIA [name] MRN: [id number]; DOB: 07/06/1940  Admit date: 06/06/2024 Date of Consult: 06/07/2024  PCP:  [name] PARAS, MD   [facility] Providers Cardiologist:  None        Patient Profile: Sherri [name] is a 84 y.o. female with a hx of HTN, HLD, history of multiple orthopedic surgeries including right hip arthroplasty s/p surgical revision now chronic osteomyelitis of the right hip on Levaquin , neuromuscular disorder, multiple sclerosis, rheumatoid arthritis, anxiety who is being seen 06/07/2024 for the evaluation of syncope at the request of Dr. Nena Rebel.  History of Present Illness: Sherri [name] has medical history as stated above. She has no significant cardiac history previously, although is following with PCP for hypertension and hyperlipidemia management. Of note, she was recently hospitalized from 03/22/24-03/29/24 for recurrent rash concerning for drug reaction after transitioning from IV cefepime  via PICC line to PO ciprofloxacin . In the ED she was found to have pancytopenia and given 1 unit PRBC, thought to be related to drug reaction or autoimmune related to her RA, Methotrexate  was discontinued. ID switched patient to Levaquin  for chronic osteomyelitis of the right hip. Metabolic derangement at presentation including hypokalemia, hypomagnesemia, hypophosphatemia, hypocalcemia, and hyponatremia all resolved during this admission. 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 Discharged home with 24/7 home health aide on Levoquin 500 mg daily which she is still taking.   Patient was BIB [facility] from home on 1/12 for a syncopal episode witnessed by her home aide. Syncope occurred when standing up from wheelchair to lay in bed with the assistance of her home aide. No fall as the aide was supporting the patient's weight and caught her when she lost consciousness. Last thing patient remembers is standing up from the  wheelchair and then her next memory is her aide in a panic and informing her that she had called 911 because the patient passed out. LOC time estimated to be a few minutes per patient. She states that she cannot recall any symptoms before or after LOC. Denies any lightheadedness/dizziness, chest pain, shortness of breath, palpitations, N/V, headache, flushing at that time. Denies any history of falls or syncope. States that she has been eating really well and staying hydrated since returning home from her hospitalization in November, steadily gaining weight since then. She does take daily Lasix  for lower extremity swelling. She states that she is on medication to manage hypertension but chart does not reflect this. States that her blood pressure is regularly checked by home aides and has been normal. Of note, patient is wheelchair bound with R leg paralysis for which she requires home aide assistance when moving in/out of wheelchair. She does endorse lightheadedness/dizziness when the aide lifts her up from lying position quickly. Denies issues with balance or lightheadedness otherwise.   Relevant workup this admission: Tropnonins 204>> 196 >> 197 >> 207 proBNP 1,381, within normal limits for patient's age EKG showed sinus rhythm with PAC, rate 79 bpm CBC with mild anemia at baseline, Hgb 10.5, normal WBC and platelets Metabolic panel with mild hypocalcemia 8.4 and hypoalbuminemia 3.2, now resolved UA, TSH normal CXR without acute findings, healing right anterior second rib fracture Echo pending   Past Medical History:  Diagnosis Date   Anxiety    takes Valium  daily as needed   Basal cell carcinoma 01/21/1988   left nostril (MOHS), sup-right calf (CX35FU)   Basal cell carcinoma 03/22/1996   right calf (CX35FU)  Basal cell carcinoma 03/31/1995   left wing nose   Bruises easily    d/t meds   Cancer (HCC)    basal cell ca, in situ- uterine    Cataracts, bilateral    removed bilateral    Chronic back pain    Dizziness    r/t to meds   GERD (gastroesophageal reflux disease)    no meds on a regular basis but will take Tums if needed   Headache(784.0)    r/t neck issues   History of bronchitis 6-74yrs ago   History of colon polyps    benign   HOH (hard of hearing)    wears hearing aids   HOH (hard of hearing)    wears hearing aids    Hyperlipidemia    takes Atorvastatin  on Mondays and Fridays   Hypertension    Joint pain    Joint swelling    Multiple sclerosis    Neuromuscular disorder (HCC)    Dr. FABIENE Crete- [facility], follows [facility]   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   Rheumatoid arthritis (HCC)    Dr Lorrayne weekly, RA- hands- knees- feet    Rheumatoid arthritis(714.0)    Dr Dolphus takes Xeljanz  daily   Right wrist fracture    Skin cancer    Squamous cell carcinoma of skin 11/02/2001   in situ-right knee (cx56fu)   Squamous cell carcinoma of skin 11/12/2011   in situ-left forearm (CX35FU), in situ-left foot (CX35FU)   Squamous cell carcinoma of skin 01/22/2012   in situ-left lower forearm (txpbx)   Squamous cell carcinoma of skin 11/17/2012   right shin (txpbx)   Squamous cell carcinoma of skin 10/28/2013   in situ-right shoulder (CX35FU)   Squamous cell carcinoma of skin 04/28/2014   well diff-right shin (txpbx)   Squamous cell carcinoma of skin 11/02/2014   in situ-Left shin (txpbx)   Squamous cell carcinoma of skin 12/15/2014   in situ-Left hand (txpbx), bowens-left side chest (txpbx)   Squamous cell carcinoma of skin 05/09/2015   in situ-left hand (txpbx)    Squamous cell carcinoma of skin 05/07/2016   in situ-left inner shin,ant, in situ-left inner shin, post, in situ-left outer forearm, in situ-right knuckle   Squamous cell carcinoma of skin 03/1302018   in situ-left  shoulder (txpbx), in situ-right inner shin (txpbx), in situ-top of left foot (txpbx), KA- left forearm (txpbx)   Squamous cell carcinoma of skin 12/02/2016   in situ-left inner shin (txpbx)   Squamous cell carcinoma of skin 03/04/2017   in situ-left outer sup, shin (txpbx), in situ- right 2nd knuckle finger (txpbx), in situ- Left wrist (txpbx)   Squamous cell carcinoma of skin 04/06/2018   in situ-above left knee inner (txpbx)   Squamous cell carcinoma of skin 04/21/2018   in situ-right top hand (txpbx)   Squamous cell carcinoma of skin 07/08/2018   in situ-right lower inner shin (txpbx)   Squamous cell carcinoma of skin 04/27/2019   in situ- Left neck(CX35FU), In situ- right neck (CX35FU)   Urinary retention    sees Dr.Wrenn about 2 times a yr    Past Surgical History:  Procedure Laterality Date   ABDOMINAL HYSTERECTOMY     1985   ANTERIOR CERVICAL DECOMP/DISCECTOMY FUSION N/A 04/23/2022   Procedure: Anterior Cervical Decompression/Discectomy Fusion - Cervical Seven-Thoracic One;  Surgeon: Louis Shove, MD;  Location: [facility] OR;  Service: Neurosurgery;  Laterality: N/A;  3C   ANTERIOR HIP REVISION Right 01/21/2024   Procedure: REVISION, TOTAL ARTHROPLASTY, HIP, ANTERIOR APPROACH;  Surgeon: Fidel Rogue, MD;  Location: [facility] ORS;  Service: Orthopedics;  Laterality: Right;   APPENDECTOMY     with TAH   BACK SURGERY     several   BREAST BIOPSY Right    Results were negative   BROW LIFT Bilateral 10/02/2016   Procedure: BILATERAL LOWER LID BLEPHAROPLASTY;  Surgeon: Laurie Loyd Redhead, MD;  Location: [facility] OR;  Service: Plastics;  Laterality: Bilateral;   CARPAL TUNNEL RELEASE Right    cataracts     CERVICAL FUSION     Dr Leeann   CERVICAL FUSION  12/31/2012   Dr Leeann   CERVICAL FUSION  04/2022   CHEST TUBE INSERTION     for traumatic Pneumothorax   COLONOSCOPY  07/16/2010   normal    COLONOSCOPY W/ POLYPECTOMY  1997   negative since; Dr Debrah   ESOPHAGOGASTRODUODENOSCOPY   07/16/2010   normal   eye lid raise     EYE SURGERY Bilateral    cataracts removed - /w IOL   HARDWARE REMOVAL  08/2021   HIP CLOSED REDUCTION Right 11/16/2023   Procedure: CLOSED MANIPULATION, JOINT, HIP;  Surgeon: Ernie Cough, MD;  Location: [facility];  Service: Orthopedics;  Laterality: Right;   HIP CLOSED REDUCTION N/A 11/30/2023   Procedure: CLOSED REDUCTION, HIP;  Surgeon: Fidel Rogue, MD;  Location: [facility] OR;  Service: Orthopedics;  Laterality: N/A;   INCISION AND DRAINAGE OF WOUND Right 11/28/2023   Procedure: IRRIGATION AND DEBRIDEMENT WOUND; REMOVAL OF HARDWARE;  Surgeon: Edna Toribio LABOR, MD;  Location: [facility] OR;  Service: Orthopedics;  Laterality: Right;  I & D RIGHT ELBOW   JOINT REPLACEMENT     LUMBAR FUSION     Dr Leeann   NASAL SINUS SURGERY     ORIF ELBOW FRACTURE Right 11/20/2023   Procedure: OPEN REDUCTION INTERNAL FIXATION (ORIF) ELBOW/OLECRANON FRACTURE;  Surgeon: [name] Franky SQUIBB, MD;  Location: [facility] OR;  Service: Orthopedics;  Laterality: Right;   POSTERIOR CERVICAL FUSION/FORAMINOTOMY N/A 12/31/2012   Procedure: POSTERIOR LATERAL CERVICAL FUSION/FORAMINOTOMY LEVEL 1 CERVICAL THREE-FOUR WITH LATERAL MASS SCREWS;  Surgeon: Catalina CHRISTELLA Leeann, MD;  Location: [facility];  Service: Neurosurgery;  Laterality: N/A;   PTOSIS REPAIR Bilateral 10/02/2016   Procedure: INTERNAL PTOSIS REPAIR;  Surgeon: Laurie Loyd Redhead, MD;  Location: [facility] OR;  Service: Plastics;  Laterality: Bilateral;   SKIN BIOPSY     THYROID  SURGERY     R lobe removed- 1972, has grown back - CT last done- 2018   TOTAL HIP ARTHROPLASTY  12/22/2011   Procedure: TOTAL HIP ARTHROPLASTY;  Surgeon: Dempsey JINNY Sensor, MD;  Location: [facility] OR;  Service: Orthopedics;  Laterality: Left;   TOTAL HIP ARTHROPLASTY Right 11/12/2023   Procedure: ARTHROPLASTY, HIP, TOTAL, ANTERIOR APPROACH;  Surgeon: Fidel Rogue, MD;  Location: [facility];  Service: Orthopedics;  Laterality: Right;   TOTAL HIP REVISION Right 12/03/2023   Procedure:  TOTAL HIP REVISION;  Surgeon: Fidel Rogue, MD;  Location: [facility];  Service: Orthopedics;  Laterality: Right;  Right acetabular revision Anterior approach   TOTAL SHOULDER ARTHROPLASTY     TUBAL LIGATION     UPPER GASTROINTESTINAL ENDOSCOPY  2012   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   WRIST SURGERY Left 09/16/2022   wrist/arm       Scheduled Meds:  ascorbic acid   1,000 mg Oral Daily   cholecalciferol   1,000 Units Oral Daily   DULoxetine   20 mg Oral QHS   folic acid   1 mg Oral Daily   levofloxacin   750 mg Oral Daily   mirtazapine   7.5 mg Oral QHS   pantoprazole   40 mg Oral BID AC   predniSONE   5 mg Oral Q breakfast   sodium chloride  flush  3 mL Intravenous Q12H   tamsulosin   0.4 mg Oral QPC supper   Continuous Infusions:  heparin  800 Units/hr (06/07/24 0151)   PRN Meds: acetaminophen  **OR** acetaminophen , artificial tears, diazepam , HYDROcodone -acetaminophen , ondansetron  **OR** ondansetron  (ZOFRAN ) IV, polyethylene glycol  Allergies:   Allergies[1]  Social History:   Social History   Socioeconomic History   Marital status: Married    Spouse name: Cheryl   Number of children: 2   Years of education: Not on file   Highest education level: Not on file  Occupational History   Occupation: retired  Tobacco Use   Smoking status: Former    Current packs/day: 0.00    Average packs/day: 2.5 packs/day for 20.0 years (50.0 ttl pk-yrs)    Types: Cigarettes    Start date: 05/26/1961    Quit date: 05/26/1981    Years since quitting: 43.0    Passive exposure: Never   Smokeless tobacco: Never   Tobacco comments:    Smoked [phone], up to 2.5 ppd  Vaping Use   Vaping status: Never Used  Substance and Sexual Activity   Alcohol  use: Yes    Alcohol /week: 14.0 standard drinks of alcohol     Types: 14 Cans of beer per week    Comment: 2% beer daily   Drug use: Never   Sexual activity: Not Currently    Birth control/protection: Surgical    Comment: Hysterectomy  Other Topics  Concern   Not on file  Social History Narrative   Married   Social Drivers of Health   Tobacco Use: Medium Risk (06/06/2024)   Patient History    Smoking Tobacco Use: Former    Smokeless Tobacco Use: Never    Passive Exposure: Never  Physicist, Medical Strain: Low Risk (02/21/2022)   Overall Financial Resource Strain (CARDIA)    Difficulty of Paying Living Expenses: Not hard at all  Food Insecurity: No Food Insecurity (05/17/2024)   Epic    Worried About Programme Researcher, Broadcasting/film/video in the Last Year: Never true    Ran Out of Food in the Last Year: Never true  Transportation Needs: No Transportation Needs (05/17/2024)   Epic    Lack of Transportation (Medical): No    Lack of Transportation (Non-Medical): No  Physical Activity: Insufficiently Active (05/17/2024)   Exercise Vital Sign    Days of Exercise per Week: 1 day    Minutes of Exercise per Session: 10 min  Stress: Stress Concern Present (05/17/2024)   [employer] of Occupational Health - Occupational Stress Questionnaire    Feeling of Stress: To some extent  Social Connections: Moderately Isolated (05/17/2024)   Social Connection and Isolation Panel    Frequency of Communication with Friends and Family: More than three times a week    Frequency of Social Gatherings with Friends and Family: More than three times a week    Attends Religious Services: Never    Database Administrator or Organizations: No    Attends Banker Meetings: Never    Marital Status: Married  Catering Manager Violence: Not At Risk (05/17/2024)  Epic    Fear of Current or Ex-Partner: No    Emotionally Abused: No    Physically Abused: No    Sexually Abused: No  Depression (PHQ2-9): Low Risk (05/17/2024)   Depression (PHQ2-9)    PHQ-2 Score: 4  Recent Concern: Depression (PHQ2-9) - Medium Risk (04/08/2024)   Depression (PHQ2-9)    PHQ-2 Score: 5  Alcohol  Screen: Low Risk (02/21/2022)   Alcohol  Screen    Last Alcohol  Screening Score  (AUDIT): 6  Housing: Unknown (05/17/2024)   Epic    Unable to Pay for Housing in the Last Year: No    Number of Times Moved in the Last Year: Not on file    Homeless in the Last Year: No  Utilities: Not At Risk (05/17/2024)   Epic    Threatened with loss of utilities: No  Health Literacy: Adequate Health Literacy (05/17/2024)   B1300 Health Literacy    Frequency of need for help with medical instructions: Never    Family History:    Family History  Problem Relation Age of Onset   Cancer Mother        pancreatic   Diabetes Mother    Pancreatic cancer Mother    Heart disease Father        Rheumatic   Cancer Father        ? stomach   Stomach cancer Father    Cancer Sister        stomach   Stomach cancer Sister    Cancer Maternal Aunt        X 4; ? primary   Heart disease Paternal Aunt    Cancer Maternal Grandmother        cervical   Colon cancer Other        Aunts   Multiple sclerosis Daughter    Hypertension Neg Hx    Stroke Neg Hx    Esophageal cancer Neg Hx    Rectal cancer Neg Hx      ROS:  Please see the history of present illness.   All other ROS reviewed and negative.     Physical Exam/Data: Vitals:   06/07/24 0454 06/07/24 0611 06/07/24 0830 06/07/24 0944  BP:  (!) 159/77 124/64   Pulse:  79 81   Resp:  18 13   Temp: 97.7 F (36.5 C)   97.9 F (36.6 C)  TempSrc: Oral   Oral  SpO2:  98% 99%   Weight:      Height:       No intake or output data in the 24 hours ending 06/07/24 0954    06/06/2024   11:57 AM 06/01/2024    1:55 PM 05/17/2024    3:34 PM  Last 3 Weights  Weight (lbs) 105 lb 13.1 oz 106 lb 92 lb  Weight (kg) 48 kg 48.081 kg 41.731 kg     Body mass index is 20.67 kg/m.  General: Frail, chronically-ill appearing elderly female resting comfortably in bed, in no acute distress Neck: No JVD Vascular: Distal pulses 2+ bilaterally Cardiac: Normal S1, S2; RRR; no murmur Lungs: Clear to auscultation bilaterally, no wheezing, rhonchi or  rales  Abd: Soft, nontender Ext: No peripheral edema Musculoskeletal: No deformities Skin: Warm and dry  Neuro: No focal abnormalities noted Psych: Normal affect   EKG: The EKG was personally reviewed and demonstrates: Sinus rhythm with PAC, rate 79 bpm Telemetry: Telemetry was personally reviewed and demonstrates: Sinus rhythm with frequent PACs and PVCs, rates in the 80s-90s  Relevant CV Studies:  Echo [04/20/17]: Left ventricle: The cavity size was normal. Systolic function was  normal. The estimated ejection fraction was in the range of 60% to 65%. Wall motion was normal; there were no regional wall motion abnormalities. Left ventricular diastolic function parameters were normal. Aortic valve: Valve area (VTI): 2 cm^2. Valve area (Vmax): 2.17 cm^2. Valve area (Vmean): 1.86 cm^2.  Mitral valve: There was mild regurgitation. Left atrium: The atrium was mildly dilated.  Atrial septum: There was increased thickness of the septum, consistent with lipomatous hypertrophy. No defect or patent foramen ovale was identified.   US  carotid bilateral [05/07/15]:  Left PSV: 11-125 cms; mild carotid disease Right PSV: 11-125 cms; mild carotid disease   Laboratory Data: High Sensitivity Troponin:  No results for input(s): TROPONINIHS in the last 720 hours.  Recent Labs  Lab 06/06/24 1235 06/06/24 1443 06/06/24 1641 06/06/24 1848  TRNPT 204* 196* 197* 207*      Chemistry Recent Labs  Lab 06/06/24 1235 06/07/24 0052  NA 140 140  K 3.5 3.6  CL 105 102  CO2 25 29  GLUCOSE 100* 81  BUN 19 19  CREATININE 0.76 0.79  CALCIUM  8.4* 9.2  GFRNONAA >60 >60  ANIONGAP 10 9    Recent Labs  Lab 06/06/24 1235  PROT 6.0*  ALBUMIN  3.2*  AST 21  ALT 5  ALKPHOS 49  BILITOT 0.2   Lipids No results for input(s): CHOL, TRIG, HDL, LABVLDL, LDLCALC, CHOLHDL in the last 168 hours.  Hematology Recent Labs  Lab 06/06/24 1235 06/07/24 0052  WBC 7.0 4.9  RBC 3.41* 3.42*  HGB  10.4* 10.5*  HCT 32.8* 32.5*  MCV 96.2 95.0  MCH 30.5 30.7  MCHC 31.7 32.3  RDW 14.6 14.6  PLT 263 279   Thyroid   Recent Labs  Lab 06/06/24 1641  TSH 2.550    BNP Recent Labs  Lab 06/06/24 1641  PROBNP 1,381.0*    DDimer No results for input(s): DDIMER in the last 168 hours.  Radiology/Studies:  DG Chest Portable 1 View Result Date: 06/06/2024 CLINICAL DATA:  Syncope, myocardial infarction EXAM: PORTABLE CHEST 1 VIEW COMPARISON:  03/25/2024 FINDINGS: Single frontal view of the chest demonstrates an unremarkable cardiac silhouette. No acute airspace disease, effusion, or pneumothorax. Evidence of healing right anterior second rib fracture with moderate callus formation, new since prior study. No acute bony abnormalities. IMPRESSION: 1. No acute intrathoracic process. 2. Healing right anterior second rib fracture, new since prior exam. Electronically Signed   By: Ozell Daring M.D.   On: 06/06/2024 18:27     Assessment and Plan: Syncopal episode Presented to the ED with single brief syncopal episode witnessed by home aide, LOC time estimated to be a few minutes per pt EKG/telemetry shows sinus rhythm with frequent PACs and PVCs Troponins elevated and flat 204 >> 196 >> 197 >> 207 proBNP within normal limits for patient's age Mildly anemic, stable at baseline UA, TSH, BMET unremarkable CXR without acute findings Last echo [04/20/17]: LVEF 60-65% no RWMA, no significant valvular abnormalities Orthostatic vitals ordered Etiology of syncope unclear. EKG/telemetry without significant bradycardia, pauses, dysrhythmias, or high-grade AV block. Troponins elevated with reassuring trend, likely due to demand ischemia in the setting of syncope. Echo still pending at this time. If evidence of LV systolic dysfunction or wall motion abnormalities on echo, will proceed with ischemic evaluation. If echo is normal, will arrange for 2-week cardiac monitor as outpatient following discharge and  f/u with outpatient cardiology  Hypertension  BP elevated but stable throughout admission No current antihypertensives at home  Hyperlipidemia Previously on statin, discontinued at discharge of last hospitalization  Chronic osteomyelitis of R hip On Levaquin  Wheelchair bound with R leg paralysis Ongoing management per primary  Per primary Multiple sclerosis Rheumatoid arthritis  Neuromuscular disorder   Risk Assessment/Risk Scores:      For questions or updates, please contact [facility] Please consult [email or web address] for contact info under      Signed, Owen MARLA Daniels, PA-C  06/07/2024 9:54 AM      [1]  Allergies Allergen Reactions   Ciprofloxacin  Itching, Nausea Only, Other (See Comments), Rash and Dermatitis    Blistering rash and oral ulcers occurred when retried Cipro  and severe mouth pain  Made her sick- stomach hurt. (Also)  ciprofloxacin    Darvon [Propoxyphene Hcl] Shortness Of Breath and Other (See Comments)    ? Dose Related ? Lowered respirations greatly   Demerol [Meperidine] Shortness Of Breath and Other (See Comments)    Respiratory Distress   Penicillins Hives and Other (See Comments)    Has patient had a PCN reaction causing immediate rash, facial/tongue/throat swelling, SOB or lightheadedness with hypotension: No SEVERE RASH INVOLVING MUCUS MEMBRANES or SKIN NECROSIS: #  #  #  YES  #  #  #  Has patient had a PCN reaction that required hospitalization No Has patient had a PCN reaction occurring within the last 10 years: No.    Imuran [Azathioprine] Other (See Comments)    Weakness   Sulfa  Antibiotics Nausea Only and Other (See Comments)    States had severe indigestion and heartburn and nausea and had to stop taking   Arava [Leflunomide] Other (See Comments)    Excessive weight gain   Lipitor [Atorvastatin ] Nausea And Vomiting    Currently taking 2 times weekly - able to tolerate low dose    Vytorin [Ezetimibe-Simvastatin] Nausea And  Vomiting   "

## 2024-06-07 NOTE — Progress Notes (Signed)
 Echo attempted, pt using restroom   [facility]

## 2024-06-07 NOTE — Progress Notes (Signed)
 Echocardiogram 2D Echocardiogram has been performed.  Sherri [name] 06/07/2024, 2:47 PM

## 2024-06-07 NOTE — Progress Notes (Signed)
 " Progress Note   Patient: Sherri [name]:[id number] DOB: 01-26-1941 DOA: 06/06/2024     0 DOS: the patient was seen and examined on 06/07/2024   Brief hospital course: Sherri [name] is a 84 y.o. 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  Caregiver hold her before falling.   Patient is wheelchair-bound at baseline, stands up with help with caregivers for transfer.  Today when caregiver stood her up she suddenly became lump, eyes were rolled and she lost consciousness for couple of minutes, no fall as caregiver put her back on the bed.  Patient does not remember anything what happened.  No seizure-like activity, incontinence or tongue bite noted.  Denies any chest pain, shortness of breath, nausea, vomiting or diarrhea.  No other illnesses.  No sick contacts.  Appetite and bowel habit at baseline.  No urinary symptoms.   ED course and data reviewed.  On presentation vital stable, on room air, CBC and CMP mostly unremarkable, troponin elevated at 204>196> 197, proBNP at 1381, UA ordered and pending.  CXR with no acute abnormality, healing right anterior second rib fracture. EKG, personally reviewed, NSR with few premature atrial complexes and no significant ST changes.   Patient was started on heparin  infusion by EDP, cardiology was also notified and [facility] was asked to admit for syncopal workup.  Assessment and Plan:  Syncope/collapse. -Patient stated that caregiver was trying to transfer her from wheelchair to bed, patient syncopized for few seconds. -Likely orthostatic/vasovagal in nature. - No signs of infection or sepsis - Orthostatic vitals ordered - Echocardiogram   Elevated troponin.   -No chest pain or EKG changes, likely demand ischemia with syncopal episode.   -EDP started her on heparin  infusion for concern of NSTEMI.   -Elevated proBNP at  1381.  Cardiology was consulted, will follow recommendation - Continue with heparin  infusion for now - Echocardiogram ordered.   History of rheumatoid arthritis. - Continue home prednisone    Chronic hip hardware infection.  Patient followed up with ID as outpatient and on long-term Levaquin . - Continue home Levaquin  - Continue with pain management   Depression/anxiety. - Continue home Valium  at night as patient was taking daily - Continue Cymbalta  and Remeron      Subjective: Feels better, denies any nausea vomiting abdomen pain chest pain shortness of palpitations.  Denies any syncope today  Physical Exam: Vitals:   06/07/24 0830 06/07/24 0944 06/07/24 1200 06/07/24 1327  BP: 124/64  134/62   Pulse: 81  80   Resp: 13  12   Temp:  97.9 F (36.6 C)  98.5 F (36.9 C)  TempSrc:  Oral  Oral  SpO2: 99%  100%   Weight:      Height:       Constitutional: Alert, awake, calm, comfortable HEENT: Neck supple Respiratory: Clear to auscultation B/L, no wheezing, no rales.  Cardiovascular: Regular rate and rhythm, no murmurs / rubs / gallops. No extremity edema. 2+ pedal pulses. No carotid bruits.  Abdomen: Soft, no tenderness, Bowel sounds positive.  Musculoskeletal: no clubbing / cyanosis. Good ROM, no contractures. Normal muscle tone.  Skin: no rashes, lesions, ulcers. Neurologic: CN 2-12 grossly intact. Sensation intact, No focal deficit identified Psychiatric: Alert and oriented x 3. Normal mood.    Data Reviewed:  Reviewed  Family Communication: None  Disposition: Status is: Observation The patient remains OBS appropriate and will  d/c before 2 midnights.  Planned Discharge Destination: Home with Home Health    Time spent: 35 minutes  Author: Osualdo Hansell, MD 06/07/2024 2:09 PM  For on call review [email or web address].  "

## 2024-06-08 ENCOUNTER — Other Ambulatory Visit: Payer: Self-pay

## 2024-06-08 DIAGNOSIS — R55 Syncope and collapse: Secondary | ICD-10-CM

## 2024-06-08 DIAGNOSIS — R7989 Other specified abnormal findings of blood chemistry: Secondary | ICD-10-CM

## 2024-06-08 LAB — CBC
HCT: 25.6 % — ABNORMAL LOW (ref 36.0–46.0)
Hemoglobin: 8.3 g/dL — ABNORMAL LOW (ref 12.0–15.0)
MCH: 30.9 pg (ref 26.0–34.0)
MCHC: 32.4 g/dL (ref 30.0–36.0)
MCV: 95.2 fL (ref 80.0–100.0)
Platelets: 209 K/uL (ref 150–400)
RBC: 2.69 MIL/uL — ABNORMAL LOW (ref 3.87–5.11)
RDW: 14.6 % (ref 11.5–15.5)
WBC: 3.7 K/uL — ABNORMAL LOW (ref 4.0–10.5)
nRBC: 0 % (ref 0.0–0.2)

## 2024-06-08 LAB — HEPARIN LEVEL (UNFRACTIONATED): Heparin Unfractionated: >1.1 [IU]/mL — ABNORMAL HIGH (ref 0.30–0.70)

## 2024-06-08 MED ORDER — HEPARIN (PORCINE) 25000 UT/250ML-% IV SOLN: 700.0000 [IU]/h | INTRAVENOUS | Status: DC

## 2024-06-08 MED ORDER — ASPIRIN 81 MG PO TBEC: 81.0000 mg | DELAYED_RELEASE_TABLET | Freq: Every day | ORAL | 0 refills | Status: AC

## 2024-06-08 MED ORDER — ASPIRIN 81 MG PO TBEC
81.0000 mg | DELAYED_RELEASE_TABLET | Freq: Every day | ORAL | Status: DC
  Administered 2024-06-08: 81 mg via ORAL
  Filled 2024-06-08: qty 1

## 2024-06-08 NOTE — Progress Notes (Signed)
 PHARMACY - ANTICOAGULATION CONSULT NOTE  Pharmacy Consult for heparin  Indication: NSTEMI  Allergies[1]  Patient Measurements: Height: 5' (152.4 cm) Weight: 48 kg (105 lb 13.1 oz) IBW/kg (Calculated) : 45.5 HEPARIN  DW (KG): 48  Vital Signs: Temp: 97.8 F (36.6 C) (01/14 0522) Temp Source: Oral (01/14 0035) BP: 118/55 (01/14 0430) Pulse Rate: 78 (01/14 0430)  Labs: Recent Labs    06/06/24 1235 06/06/24 1547 06/07/24 0052 06/07/24 1025 06/08/24 0610  HGB 10.4*  --  10.5*  --  8.3*  HCT 32.8*  --  32.5*  --  25.6*  PLT 263  --  279  --  209  APTT  --  35  --   --   --   LABPROT  --  14.2  --   --   --   INR  --  1.0  --   --   --   HEPARINUNFRC  --   --  <0.10* 0.30 >1.10*  CREATININE 0.76  --  0.79  --   --     Estimated Creatinine Clearance: 38.9 mL/min (by C-G formula based on SCr of 0.79 mg/dL).   Assessment: 84 yo F with STEMI.  Pharmacy consulted to dose IV heparin . No anticoagulant PTA. Baseline labs: Hg 10.4, PLT 263, SCr 0.76, Troponin 204> 196  06/08/2024:  heparin  level >1.1  - now supra-therapeutic on heparin  800 units/hr  -Per RN, lab was drawn from opposite arm as heparin  infusing CBC: Hg 8.3 low/decreased, pltc WNL No bleeding or infusion related concerns reported by RN  Goal of Therapy:  Heparin  level 0.3-0.7 units/ml Monitor platelets by anticoagulation protocol: Yes   Plan:  Hold heparin  drip x1 hour then resume at reduced rate of 700 units/hr Recheck heparin  level ~4p Daily CBC & heparin  level F/u cardiology work up> anticipate heparin  to be stopped if echo is negative  Rosaline Millet, PharmD, BCPS 06/08/2024 6:49 AM      [1]  Allergies Allergen Reactions   Ciprofloxacin  Itching, Nausea Only, Other (See Comments), Rash and Dermatitis    Blistering rash and oral ulcers occurred when retried Cipro  and severe mouth pain  Made her sick- stomach hurt. (Also)  ciprofloxacin    Darvon [Propoxyphene Hcl] Shortness Of Breath and  Other (See Comments)    ? Dose Related ? Lowered respirations greatly   Demerol [Meperidine] Shortness Of Breath and Other (See Comments)    Respiratory Distress   Penicillins Hives and Other (See Comments)    Has patient had a PCN reaction causing immediate rash, facial/tongue/throat swelling, SOB or lightheadedness with hypotension: No SEVERE RASH INVOLVING MUCUS MEMBRANES or SKIN NECROSIS: #  #  #  YES  #  #  #  Has patient had a PCN reaction that required hospitalization No Has patient had a PCN reaction occurring within the last 10 years: No.    Imuran [Azathioprine] Other (See Comments)    Weakness   Sulfa  Antibiotics Nausea Only and Other (See Comments)    States had severe indigestion and heartburn and nausea and had to stop taking   Arava [Leflunomide] Other (See Comments)    Excessive weight gain   Lipitor [Atorvastatin ] Nausea And Vomiting    Currently taking 2 times weekly - able to tolerate low dose    Vytorin [Ezetimibe-Simvastatin] Nausea And Vomiting

## 2024-06-08 NOTE — Discharge Summary (Signed)
 Physician Discharge Summary  Sherri  PAYZLEE [name]:[id number] DOB: Mar 24, 1941 DOA: 06/06/2024  PCP: Sherri Glade PARAS, MD  Admit date: 06/06/2024 Discharge date: 06/08/2024 Discharging to: Home Recommendations for Outpatient Follow-up:  Follow-up hemoglobin at next office visit  Consults:  Cardiology     Discharge Diagnoses:   Principal Problem:   Syncope Active Problems:   Elevated troponin   Anemia of chronic disease   Rheumatoid arthritis (HCC)   Chronic pain   Multiple sclerosis     Hospital Course:  This is an 84 year old female with MS, rheumatoid arthritis, left total hip replacement with revision, infected left hip hardware on prolonged Levaquin  who presented to the hospital with a syncopal episode.  The patient is mostly wheelchair-bound and when she stood up with the assistance of her caregiver, she became limp and lost consciousness for a few minutes.  She did not fall and was assisted back to the bed by her caregiver.  There is no seizure-like activity. In the ED: Troponin noted to be 204, 196 and 197 Pro BNP 1381 She was started on heparin  infusion and cardiology was consulted  Principal Problem:   Syncope -Unknown etiology -Orthostatic vitals noted to be positive in the ED however, she is sitting up in the chair eating lunch and she is asymptomatic, therefore, I have not ordered IV fluids - Troponin trend remained flat-felt to be demand ischemia-noted to have coronary calcifications on CT in 2024 - 2D echo reveals an EF of 60 to 65% and grade 1 diastolic dysfunction - Cardiology has evaluated the patient and recommends baby aspirin  and outpatient Holter monitor which I have discussed with the patient and her daughter today  Active Problems:   Anemia, normocytic - Hemoglobin was 10.5 and is 8.3 today - Likely anemia of chronic disease -Has a history of acquired pancytopenia in the past in the setting of bone marrow suppression from recurrent hospitalization, poor  nutrition, infection and broad-spectrum IV antibiotics and was referred to hematology-no interventions were done - There are no signs of acute blood loss today and she is asymptomatic while sitting up and eating therefore I am holding off on ordering the blood transfusion - She should get a CBC at her next appointment           Discharge Instructions   Allergies as of 06/08/2024       Reactions   Ciprofloxacin  Itching, Nausea Only, Other (See Comments), Rash, Dermatitis   Blistering rash and oral ulcers occurred when retried Cipro  and severe mouth pain Made her sick- stomach hurt. (Also) ciprofloxacin    Darvon [propoxyphene Hcl] Shortness Of Breath, Other (See Comments)   ? Dose Related ? Lowered respirations greatly   Demerol [meperidine] Shortness Of Breath, Other (See Comments)   Respiratory Distress   Penicillins Hives, Other (See Comments)   Has patient had a PCN reaction causing immediate rash, facial/tongue/throat swelling, SOB or lightheadedness with hypotension: No SEVERE RASH INVOLVING MUCUS MEMBRANES or SKIN NECROSIS: #  #  #  YES  #  #  #  Has patient had a PCN reaction that required hospitalization No Has patient had a PCN reaction occurring within the last 10 years: No.   Imuran [azathioprine] Other (See Comments)   Weakness   Sulfa  Antibiotics Nausea Only, Other (See Comments)   States had severe indigestion and heartburn and nausea and had to stop taking   Arava [leflunomide] Other (See Comments)   Excessive weight gain   Lipitor [atorvastatin ] Nausea And Vomiting   Currently  taking 2 times weekly - able to tolerate low dose    Vytorin [ezetimibe-simvastatin] Nausea And Vomiting        Medication List     TAKE these medications    acetaminophen  325 MG tablet Commonly known as: TYLENOL  Take 2 tablets (650 mg total) by mouth every 6 (six) hours as needed for mild pain (pain score 1-3) or fever (or Fever >/= 101).   Acidophilus Caps capsule Take 2  capsules by mouth 3 (three) times daily.   aspirin  EC 81 MG tablet Take 1 tablet (81 mg total) by mouth daily. Swallow whole.   Biotin  5000 MCG Tabs Take 5,000 mcg by mouth in the morning.   chlorhexidine  0.12 % solution Commonly known as: PERIDEX  Use as directed 15 mLs in the mouth or throat 4 (four) times daily. What changed:  when to take this reasons to take this   cyclobenzaprine  10 MG tablet Commonly known as: FLEXERIL  TAKE 1 TABLET BY MOUTH THREE TIMES A DAY AS NEEDED FOR MUSCLE SPASM What changed: See the new instructions.   diazepam  5 MG tablet Commonly known as: VALIUM  Take 1 tablet (5 mg total) by mouth at bedtime as needed for anxiety. TAKE 1 TABLET BY MOUTH AT BEDTIME AS NEEDED FOR MUSCLE SPASMS. What changed: when to take this   DULoxetine  20 MG capsule Commonly known as: CYMBALTA  TAKE 1 CAPSULE BY MOUTH EVERYDAY AT BEDTIME   folic acid  1 MG tablet Commonly known as: FOLVITE  Take 1 tablet (1 mg total) by mouth daily.   furosemide  20 MG tablet Commonly known as: LASIX  Take 0.5 tablets (10 mg total) by mouth daily. What changed: how much to take   Gerhardt's butt cream Crea Apply 1 Application topically 3 (three) times daily. What changed:  when to take this reasons to take this   HYDROcodone -acetaminophen  5-325 MG tablet Commonly known as: NORCO/VICODIN Take 1 tablet by mouth every 8 (eight) hours as needed for severe pain (pain score 7-10) (Chronic joint pain).   levofloxacin  750 MG tablet Commonly known as: Levaquin  Take 1 tablet (750 mg total) by mouth daily.   lidocaine  2 % solution Commonly known as: XYLOCAINE  Use as directed 15 mLs in the mouth or throat every 3 (three) hours as needed for mouth pain.   LUBRICATING EYE DROPS OP Place 1 drop into both eyes 3 (three) times daily as needed (dry eyes).   magic mouthwash (nystatin , lidocaine , diphenhydrAMINE , alum & mag hydroxide) suspension Swish and spit 5 mLs 3 (three) times daily as needed  for mouth pain.   mirtazapine  7.5 MG tablet Commonly known as: REMERON  TAKE 1 TABLET BY MOUTH AT BEDTIME.   ondansetron  4 MG tablet Commonly known as: ZOFRAN  Take 1 tablet (4 mg total) by mouth every 6 (six) hours as needed for nausea.   pantoprazole  40 MG tablet Commonly known as: PROTONIX  TAKE 1 TABLET (40 MG TOTAL) BY MOUTH TWICE A DAY BEFORE MEALS   phenol 1.4 % Liqd Commonly known as: CHLORASEPTIC Use as directed 1 spray in the mouth or throat as needed for throat irritation / pain.   polyethylene glycol powder 17 GM/SCOOP powder Commonly known as: GLYCOLAX /MIRALAX  Take 17 g by mouth daily. What changed:  when to take this additional instructions   predniSONE  5 MG tablet Commonly known as: DELTASONE  Take 1 tablet (5 mg total) by mouth daily with breakfast.   Prolia  60 MG/ML Sosy injection Generic drug: denosumab  Inject 60 mg into the skin every 6 (six) months. Deliver  to rheum: [address], [place], [place] [address]. Appt on 07/23/2022   Reality Incontinent Briefs SM Misc UAD for urinary incontinence   tamsulosin  0.4 MG Caps capsule Commonly known as: FLOMAX  TAKE 1 CAPSULE BY MOUTH EVERY DAY AFTER SUPPER   vitamin C  1000 MG tablet Take 1 tablet (1,000 mg total) by mouth daily.   Vitamin D3 1000 units Caps Take 1,000 Units by mouth daily.   Zinc  Sulfate 220 (50 Zn) MG Tabs Take 1 tablet (220 mg total) by mouth daily with supper.        Follow-up Information     Delford Maude BROCKS, MD Follow up.   Specialty: Cardiology Why: Cardiology office will call you to discuss a cardiac monitor Contact information: [address] [place] [address] [phone]         Sherri Glade PARAS, MD. Schedule an appointment as soon as possible for a visit in 1 week(s).   Specialty: Internal Medicine Why: Please ask your primary care physician to check a CBC in 1 week Contact information: [address] [place] [address] [phone]                     The results of significant diagnostics from this hospitalization (including imaging, microbiology, ancillary and laboratory) are listed below for reference.    ECHOCARDIOGRAM COMPLETE Result Date: 06/07/2024    ECHOCARDIOGRAM REPORT   Patient Name:   Julietta  A Irizarry Date of Exam: 06/07/2024 Medical Rec #:  [id number]         Height:       60.0 in Accession #:    [id number]        Weight:       105.8 lb Date of Birth:  Jul 24, 1940         BSA:          1.424 m Patient Age:    83 years          BP:           134/62 mmHg Patient Gender: F                 HR:           69 bpm. Exam Location:  Inpatient Procedure: 2D Echo (Both Spectral and Color Flow Doppler were utilized during            procedure). Indications:    Syncope  History:        Patient has no prior history of Echocardiogram examinations.                 Previous Myocardial Infarction, Arrythmias:LBBB;                 Signs/Symptoms:Syncope.  Sonographer:    Merlynn Argyle Referring Phys: [id number] SUMAYYA AMIN IMPRESSIONS  1. Left ventricular ejection fraction, by estimation, is 60 to 65%. The left ventricle has normal function. The left ventricle has no regional wall motion abnormalities. Left ventricular diastolic parameters are consistent with Grade I diastolic dysfunction (impaired relaxation).  2. Right ventricular systolic function is normal. The right ventricular size is normal. There is normal pulmonary artery systolic pressure. The estimated right ventricular systolic pressure is 30.0 mmHg.  3. The mitral valve is degenerative. Mild mitral valve regurgitation. No evidence of mitral stenosis. Moderate mitral annular calcification.  4. The aortic valve is tricuspid. Aortic valve regurgitation is not visualized. Aortic valve sclerosis is present, with no evidence of aortic valve stenosis.  5. The inferior vena cava is normal in size with greater than 50% respiratory variability, suggesting right atrial pressure of 3 mmHg. FINDINGS   Left Ventricle: Left ventricular ejection fraction, by estimation, is 60 to 65%. The left ventricle has normal function. The left ventricle has no regional wall motion abnormalities. The left ventricular internal cavity size was normal in size. There is  no left ventricular hypertrophy. Left ventricular diastolic parameters are consistent with Grade I diastolic dysfunction (impaired relaxation). Right Ventricle: The right ventricular size is normal. No increase in right ventricular wall thickness. Right ventricular systolic function is normal. There is normal pulmonary artery systolic pressure. The tricuspid regurgitant velocity is 2.60 m/s, and  with an assumed right atrial pressure of 3 mmHg, the estimated right ventricular systolic pressure is 30.0 mmHg. Left Atrium: Left atrial size was normal in size. Right Atrium: Right atrial size was normal in size. Pericardium: There is no evidence of pericardial effusion. Presence of epicardial fat layer. Mitral Valve: The mitral valve is degenerative in appearance. Moderate mitral annular calcification. Mild mitral valve regurgitation. No evidence of mitral valve stenosis. Tricuspid Valve: The tricuspid valve is grossly normal. Tricuspid valve regurgitation is mild . No evidence of tricuspid stenosis. Aortic Valve: The aortic valve is tricuspid. Aortic valve regurgitation is not visualized. Aortic valve sclerosis is present, with no evidence of aortic valve stenosis. Pulmonic Valve: The pulmonic valve was grossly normal. Pulmonic valve regurgitation is not visualized. No evidence of pulmonic stenosis. Aorta: The aortic root and ascending aorta are structurally normal, with no evidence of dilitation. Venous: The inferior vena cava is normal in size with greater than 50% respiratory variability, suggesting right atrial pressure of 3 mmHg. IAS/Shunts: The atrial septum is grossly normal.  LEFT VENTRICLE PLAX 2D LVIDd:         3.10 cm   Diastology LVIDs:         2.20 cm   LV  e' medial:    5.66 cm/s LV PW:         1.00 cm   LV E/e' medial:  16.4 LV IVS:        1.00 cm   LV e' lateral:   6.42 cm/s LVOT diam:     1.90 cm   LV E/e' lateral: 14.5 LV SV:         61 LV SV Index:   43 LVOT Area:     2.84 cm  RIGHT VENTRICLE             IVC RV Basal diam:  2.80 cm     IVC diam: 1.70 cm RV S prime:     12.50 cm/s TAPSE (M-mode): 1.9 cm LEFT ATRIUM             Index        RIGHT ATRIUM           Index LA diam:        3.70 cm 2.60 cm/m   RA Area:     11.10 cm LA Vol (A2C):   27.1 ml 19.03 ml/m  RA Volume:   23.00 ml  16.15 ml/m LA Vol (A4C):   33.0 ml 23.17 ml/m LA Biplane Vol: 32.5 ml 22.82 ml/m  AORTIC VALVE LVOT Vmax:   95.10 cm/s LVOT Vmean:  64.900 cm/s LVOT VTI:    0.214 m  AORTA Ao Root diam: 3.10 cm Ao Asc diam:  3.40 cm MITRAL VALVE  TRICUSPID VALVE MV Area (PHT): 2.91 cm     TR Peak grad:   27.0 mmHg MV Decel Time: 261 msec     TR Vmax:        260.00 cm/s MR Peak grad: 104.0 mmHg MR Mean grad: 66.0 mmHg     SHUNTS MR Vmax:      510.00 cm/s   Systemic VTI:  0.21 m MR Vmean:     385.0 cm/s    Systemic Diam: 1.90 cm MV E velocity: 93.00 cm/s MV A velocity: 119.00 cm/s MV E/A ratio:  0.78 Darryle Decent MD Electronically signed by Darryle Decent MD Signature Date/Time: 06/07/2024/3:14:07 PM    Final    DG Chest Portable 1 View Result Date: 06/06/2024 CLINICAL DATA:  Syncope, myocardial infarction EXAM: PORTABLE CHEST 1 VIEW COMPARISON:  03/25/2024 FINDINGS: Single frontal view of the chest demonstrates an unremarkable cardiac silhouette. No acute airspace disease, effusion, or pneumothorax. Evidence of healing right anterior second rib fracture with moderate callus formation, new since prior study. No acute bony abnormalities. IMPRESSION: 1. No acute intrathoracic process. 2. Healing right anterior second rib fracture, new since prior exam. Electronically Signed   By: Ozell Daring M.D.   On: 06/06/2024 18:27   Labs:   Basic Metabolic Panel: Recent Labs  Lab  06/06/24 1235 06/07/24 0052  NA 140 140  K 3.5 3.6  CL 105 102  CO2 25 29  GLUCOSE 100* 81  BUN 19 19  CREATININE 0.76 0.79  CALCIUM  8.4* 9.2     CBC: Recent Labs  Lab 06/06/24 1235 06/07/24 0052 06/08/24 0610  WBC 7.0 4.9 3.7*  HGB 10.4* 10.5* 8.3*  HCT 32.8* 32.5* 25.6*  MCV 96.2 95.0 95.2  PLT 263 279 209         SIGNED:   True Atlas, MD  [facility] 06/08/2024, 2:52 PM Time taking on discharge: 50 minutes

## 2024-06-08 NOTE — Progress Notes (Signed)
"  °  Progress Note  Patient Name: Sherri [name] Date of Encounter: 06/08/2024 [facility] Cardiologist: Previously seen by Dr. Calhoun in 2018, Unlikely to need follow up with a primary cardiologist  Interval Summary   Denied chest pain, shortness of breath, or palpitations.  Eager to go home  Vital Signs Vitals:   06/08/24 0330 06/08/24 0400 06/08/24 0430 06/08/24 0522  BP: (!) 138/93 (!) 130/53 (!) 118/55   Pulse: 88 77 78   Resp: 15 11 11    Temp:    97.8 F (36.6 C)  TempSrc:      SpO2: 92% 93% 92%   Weight:      Height:        Intake/Output Summary (Last 24 hours) at 06/08/2024 0903 Last data filed at 06/08/2024 0834 Gross per 24 hour  Intake 339.61 ml  Output --  Net 339.61 ml      06/06/2024   11:57 AM 06/01/2024    1:55 PM 05/17/2024    3:34 PM  Last 3 Weights  Weight (lbs) 105 lb 13.1 oz 106 lb 92 lb  Weight (kg) 48 kg 48.081 kg 41.731 kg      Telemetry/ECG  Sinus rhythm with intermittent LBBB, Vrm ~80-95 - Personally Reviewed  Physical Exam  GEN: No acute distress.   Cardiac: RRR, no murmurs, rubs, or gallops. DP 2+ bilaterally Respiratory: Clear to auscultation bilaterally. GI: Soft, nontender, non-distended  MS: No edema, black discoloration to bilateral shins, no tenderness  Assessment & Plan  Sherri [name] is a 84 y.o. female with a hx of HTN, HLD, history of multiple orthopedic surgeries including right hip arthroplasty s/p surgical revision now chronic osteomyelitis of the right hip on Levaquin , neuromuscular disorder, multiple sclerosis, rheumatoid arthritis, and anxiety who presented to the ED for a syncopal episode that occurred after standing up at home. Reported prodromal lightheadedness. In the ED positive orthostatic vitals. ECG without acute ischemic findings. Troponin trend ~200. ProBNP 1381.   Syncope Echo this admission showed LVEF 60-65% with no RWMA.  G1 DD. Moderate MAC with mild MR. No aortic valve disease. Telemetry this  admission shows no concerning arrhythmias.  She is on chronic levaquin  [started 03/2024] for chronic osteomyelitis, Qtc WNL   Suspected to be 2/2 orthostatic hypotension [most likely autonomic dysfunction from MS] and deconditioning. Could also be due to polypharmacy with pain medication and valium  Will order 30 day cardiac monitor to be mailed to patient's home.   Elevated Troponin Echo shows preserved EF and without RWMA.  Patient denied chest pain  Suspected to be 2/2 demand ischemia. She does have coronary calcifications noted on a CT in 2024, however given findings above do not think we need to evaluate coronaries this admission. Additionally based on the results from the [facility] trial would not advise invasive ischemic evaluation at this time especially with her anemia as described below  Stop IV heparin .  Would hold on ASA use with hgb today. If improves could consider starting.   Hypertension BP: 118/55 With syncope and + orthostatic vitals would recommend against aggressive management.   Acute on chronic anemia Hgb 10.4 -> 8.3 Recent admission requiring blood transfusion Per primary  Per primary MS RA Chronic osteomyelitis of R HIP  Depression/Anxiety   For questions or updates, please contact [facility] Please consult [email or web address] for contact info under       Signed, Leontine LOISE Salen, PA-C   "

## 2024-06-08 NOTE — Progress Notes (Signed)
" °   06/08/24 0809  TOC ED Mini Assessment  TOC Time spent with patient (minutes): 15  Admission or Readmission Diverted Yes  Means of departure Car (son or caregiver will transport patient home at d/c)  Choice offered to / list presented to  NA   RNCM spoke to patient regarding d/c plan. Pt. Stated that she plans to return home with ongoing home health services from care aide, HHPT, and HHOT. Pt. Has wheelchair and walker at home. "

## 2024-06-08 NOTE — Discharge Instructions (Signed)
 You were cared for by a hospitalist during your hospital stay. Please review all of you discharge paperwork on the day of discharge and be sure you have all of your prescribed medications and please read the below instructions:  Once you are discharged, your primary care physician will handle any further medical issues. Please note that NO REFILLS for any discharge medications will be authorized once you are discharged as it is imperative that you return to your primary care physician (or establish a relationship with a primary care physician if you do not have one) for your aftercare needs. Please obtain a follow up appointment with your primary care physician within 1-2 weeks of discharge. Please take all your medications with you for your next visit with your Primary MD. Please request your Primary MD to go over all Hospital Tests and Procedures, Radiological results at the follow up appointment. In some cases, there will be blood work, cultures and biopsy results pending at the time of your discharge. Please request that your primary care M.D. goes through all the records of your hospital data and follows up on these results. Please get all hospital records sent to your primary MD by signing hospital release before you go home or request your primary care doctor's office to assist with obtaining medical records.   You must read complete instructions/literature along with all the possible adverse reactions/side effects for all the medicines that have been prescribed to you. Take any new medicines after you have completely understood and accpet all the possible adverse reactions/side effects.  Please take medications as prescribed and speak with your doctor if changes are needed.   If you have smoked or chewed tobacco in the last 2 yrs please stop. Stop any regular alcohol  and or any recreational drug use. Wear Seat belts while driving.   If you had Pneumonia at the Hospital: Please get a 2 view  Chest X ray done in 6-8 weeks after hospital discharge or sooner if instructed by your Primary MD.   If you have Congestive Heart Failure: Follow a cardiac low salt diet and 1.5 lit/day fluid restriction. Please call your Cardiologist or Primary MD anytime you have any of the following symptoms:  1) 3 pound weight gain in 24 hours or 5 pounds in 1 week  2) shortness of breath, with or without a dry hacking cough  3) increasing swelling in the feet or stomach  4) if you have to sleep on extra pillows at night in order to breathe   If you have Diabetes: Check blood sugars 4 times/day- once on AM empty stomach and then before each meal. Log in all results and show them to your primary doctor at your next visit. If glucose readings are often under 60 or above 400 call your primary MD to see if medication dosages need to be adjusted   If you have Syncope (passing out) or Seizure/Convulsions/Epilepsy: Please do not drive, operate heavy machinery, participate in activities at heights or participate in high speed sports until you have seen by Primary MD or a Neurologist and advised to do so again. Per [facility] statutes, patients with seizures are not allowed to drive until they have been seizure-free for six months.  Use caution when using heavy equipment or power tools. Avoid working on ladders or at heights. Take showers instead of baths. Ensure the water temperature is not too high on the home water heater. Do not go swimming alone. Do not lock yourself  in a room alone (i.e. bathroom). When caring for infants or small children, sit down when holding, feeding, or changing them to minimize risk of injury to the child in the event you have a seizure. Maintain good sleep hygiene. Avoid alcohol.    If you had Gastrointestinal Bleeding: Please ask your Primary MD to check a complete blood count within one week of discharge or at your next visit. Your endoscopic/colonoscopic biopsies that are  pending at the time of discharge will also need to followed by your Primary MD.    Sherri [name] can reach the hospitalist office at phone [phone] or fax [phone]   If you do not have a primary care physician, you can call [phone] for a physician referral.

## 2024-06-08 NOTE — Care Management Obs Status (Signed)
 MEDICARE OBSERVATION STATUS NOTIFICATION   Patient Details  Name: Sherri [name]  MILANNI AYUB MRN: [id number] Date of Birth: 1940/07/05   Medicare Observation Status Notification Given:  Yes    Camelia JONETTA Cary, RN 06/08/2024, 8:09 AM

## 2024-06-09 ENCOUNTER — Telehealth: Payer: Self-pay

## 2024-06-09 NOTE — Transitions of Care (Post Inpatient/ED Visit) (Signed)
" ° °  06/09/2024  Name: Sherri [name]  RHEBA DIAMOND MRN: [id number] DOB: Sep 29, 1940  Today's TOC FU Call Status: Today's TOC FU Call Status:: Unsuccessful Call (1st Attempt) Unsuccessful Call (1st Attempt) Date: 06/09/24  Attempted to reach the patient regarding the most recent Inpatient/ED visit.  Follow Up Plan: Additional outreach attempts will be made to reach the patient to complete the Transitions of Care (Post Inpatient/ED visit) call.   Signature Julian Lemmings, LPN [facility] Nurse Health Advisor Direct Dial [phone]  "

## 2024-06-10 ENCOUNTER — Other Ambulatory Visit: Payer: Self-pay | Admitting: Internal Medicine

## 2024-06-10 NOTE — Transitions of Care (Post Inpatient/ED Visit) (Unsigned)
" ° °  06/10/2024  Name: Sherri [name] MRN: [id number] DOB: 1940-10-03  Today's TOC FU Call Status: Today's TOC FU Call Status:: Unsuccessful Call (2nd Attempt) Unsuccessful Call (1st Attempt) Date: 06/09/24 Unsuccessful Call (2nd Attempt) Date: 06/10/24  Attempted to reach the patient regarding the most recent Inpatient/ED visit.  Follow Up Plan: Additional outreach attempts will be made to reach the patient to complete the Transitions of Care (Post Inpatient/ED visit) call.   Signature Julian Lemmings, LPN [facility] Nurse Health Advisor Direct Dial [phone]  "

## 2024-06-12 ENCOUNTER — Encounter: Payer: Self-pay | Admitting: Internal Medicine

## 2024-06-12 NOTE — Progress Notes (Unsigned)
 "     Subjective:    Patient ID: Sherri [name], female    DOB: 1940/09/27, 84 y.o.   MRN: [id number]     HPI Sherri [name]  is here for follow up of her chronic medical problems.  Recently hospitalized 1/12-1/14 after syncopal episode at home.  She had assistance transitioning from walker to commode and she syncopized.  There is no injury because her home health aide was there and prevented her from falling.  Positive LOC for a few seconds.  No seizure activity.  Troponin was elevated but flat.  Cardiology was consulted and they recommended outpatient Holter monitor and follow-up.   Orthostatics were positive.  Anemia normal-likely secondary to chronic disease.  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  No signs of acute bleeding.  Referred to hematology.  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 A cane, walker, crutch will not allow her ambulate safely up the stairs.  Only a stair lift will allow patient to safely get up the stairs.  Patient can safely use the stair lift with assistance.     Currently taking 4 pain  - flexeril  tid prn, valium  HS prn, cymbalta  20 mg, vicodin 1 tabd q 8 hr    Medications and allergies reviewed with patient and updated if appropriate.  Medications Ordered Prior to Encounter[1]   Review of Systems     Objective:  There were no vitals filed for this visit. BP Readings from Last 3 Encounters:  06/08/24 120/71  05/17/24 110/62  05/05/24 (!) 140/79   Wt Readings from Last 3 Encounters:  06/06/24 105 lb 13.1 oz (48 kg)   06/01/24 106 lb (48.1 kg)  05/17/24 92 lb (41.7 kg)   There is no height or weight on file to calculate BMI.    Physical Exam     Lab Results  Component Value Date   WBC 3.7 (L) 06/08/2024   HGB 8.3 (L) 06/08/2024   HCT 25.6 (L) 06/08/2024   PLT 209 06/08/2024   GLUCOSE 81 06/07/2024   CHOL 238 (H) 08/06/2023   TRIG 53 08/06/2023   HDL 109 08/06/2023   LDLDIRECT 122.9 11/15/2012   LDLCALC 115 (H) 08/06/2023   ALT 5 06/06/2024   AST 21 06/06/2024   NA 140 06/07/2024   K 3.6 06/07/2024   CL 102 06/07/2024   CREATININE 0.79 06/07/2024   BUN 19 06/07/2024   CO2 29 06/07/2024   TSH 2.550 06/06/2024   INR 1.0 06/06/2024   HGBA1C 6.0 (H) 08/06/2023     Assessment & Plan:    See Problem List for Assessment and Plan of chronic medical problems.         [1]  Current Outpatient Medications on File Prior to Visit  Medication Sig Dispense Refill   HYDROcodone -acetaminophen  (NORCO/VICODIN) 5-325 MG tablet Take 1 tablet by mouth every 8 (eight) hours as needed for severe pain (pain score 7-10) (Chronic joint pain). (Patient not taking: Reported on 06/06/2024) 30 tablet 0   acetaminophen  (TYLENOL ) 325 MG tablet Take 2 tablets (650 mg total) by mouth every 6 (six) hours as needed for mild  pain (pain score 1-3) or fever (or Fever >/= 101). 20 tablet 0   ascorbic acid  (VITAMIN C ) 1000 MG tablet Take 1 tablet (1,000 mg total) by mouth daily. 100 tablet 0   aspirin  EC 81 MG tablet Take 1 tablet (81 mg total) by mouth daily. Swallow whole. 30 tablet 0   Biotin  5000 MCG TABS Take 5,000 mcg by mouth in the morning.     Carboxymethylcellul-Glycerin  (LUBRICATING EYE DROPS OP) Place 1 drop into both eyes 3 (three) times daily as needed (dry eyes).     chlorhexidine  (PERIDEX ) 0.12 % solution Use as directed 15 mLs in the mouth or throat 4 (four) times daily. (Patient taking differently: Use as directed 15 mLs in the mouth or throat 4 (four) times daily as needed.) 120 mL 0    Cholecalciferol  (VITAMIN D3) 1000 units CAPS Take 1,000 Units by mouth daily.     cyclobenzaprine  (FLEXERIL ) 10 MG tablet TAKE 1 TABLET BY MOUTH THREE TIMES A DAY AS NEEDED FOR MUSCLE SPASM (Patient taking differently: Take 10 mg by mouth 3 (three) times daily as needed for muscle spasms.) 90 tablet 2   denosumab  (PROLIA ) 60 MG/ML SOSY injection Inject 60 mg into the skin every 6 (six) months. Deliver to rheum: [address], [address], [place], [place] [address]. Appt on 07/23/2022 1 mL 0   diazepam  (VALIUM ) 5 MG tablet Take 1 tablet (5 mg total) by mouth at bedtime as needed for anxiety. TAKE 1 TABLET BY MOUTH AT BEDTIME AS NEEDED FOR MUSCLE SPASMS. (Patient taking differently: Take 5 mg by mouth at bedtime. TAKE 1 TABLET BY MOUTH AT BEDTIME AS NEEDED FOR MUSCLE SPASMS.) 30 tablet 5   DULoxetine  (CYMBALTA ) 20 MG capsule TAKE 1 CAPSULE BY MOUTH EVERYDAY AT BEDTIME 90 capsule 1   folic acid  (FOLVITE ) 1 MG tablet Take 1 tablet (1 mg total) by mouth daily. 90 tablet 3   furosemide  (LASIX ) 20 MG tablet TAKE 1/2 TABLET BY MOUTH DAILY 45 tablet 1   Incontinence Supply Disposable (REALITY INCONTINENT BRIEFS SM) MISC UAD for urinary incontinence 200 each 5   Lactobacillus (ACIDOPHILUS) CAPS capsule Take 2 capsules by mouth 3 (three) times daily. (Patient not taking: Reported on 06/06/2024) 180 capsule 0   levofloxacin  (LEVAQUIN ) 750 MG tablet Take 1 tablet (750 mg total) by mouth daily. 30 tablet 11   lidocaine  (XYLOCAINE ) 2 % solution Use as directed 15 mLs in the mouth or throat every 3 (three) hours as needed for mouth pain. 240 mL 0   magic mouthwash (nystatin , lidocaine , diphenhydrAMINE , alum & mag hydroxide) suspension Swish and spit 5 mLs 3 (three) times daily as needed for mouth pain. 180 mL 3   mirtazapine  (REMERON ) 7.5 MG tablet TAKE 1 TABLET BY MOUTH AT BEDTIME. 90 tablet 2   Nystatin  (GERHARDT'S BUTT CREAM) CREA Apply 1 Application topically 3 (three) times daily. (Patient taking differently: Apply 1  Application topically as needed for irritation (to buttocks after every cleansing/drying).) 60 each 0   ondansetron  (ZOFRAN ) 4 MG tablet Take 1 tablet (4 mg total) by mouth every 6 (six) hours as needed for nausea. 20 tablet 0   pantoprazole  (PROTONIX ) 40 MG tablet TAKE 1 TABLET (40 MG TOTAL) BY MOUTH TWICE A DAY BEFORE MEALS 180 tablet 1   phenol (CHLORASEPTIC) 1.4 % LIQD Use as directed 1 spray in the mouth or throat as needed for throat irritation / pain. 177 mL 0   polyethylene glycol powder (GLYCOLAX /MIRALAX ) 17 GM/SCOOP powder Take 17 g  by mouth daily. (Patient taking differently: Take 17 g by mouth See admin instructions. Mix 17 grams into a cup of coffee and drink in the morning)     predniSONE  (DELTASONE ) 5 MG tablet Take 1 tablet (5 mg total) by mouth daily with breakfast. 90 tablet 0   tamsulosin  (FLOMAX ) 0.4 MG CAPS capsule TAKE 1 CAPSULE BY MOUTH EVERY DAY AFTER SUPPER 90 capsule 1   Zinc  Sulfate 220 (50 Zn) MG TABS Take 1 tablet (220 mg total) by mouth daily with supper. 100 tablet 0   No current facility-administered medications on file prior to visit.   "

## 2024-06-13 ENCOUNTER — Ambulatory Visit (INDEPENDENT_AMBULATORY_CARE_PROVIDER_SITE_OTHER): Admitting: Internal Medicine

## 2024-06-13 VITALS — BP 110/68 | HR 92 | Temp 98.0°F | Ht 60.0 in | Wt 108.0 lb

## 2024-06-13 DIAGNOSIS — G479 Sleep disorder, unspecified: Secondary | ICD-10-CM

## 2024-06-13 DIAGNOSIS — G35D Multiple sclerosis, unspecified: Secondary | ICD-10-CM | POA: Diagnosis not present

## 2024-06-13 DIAGNOSIS — R55 Syncope and collapse: Secondary | ICD-10-CM | POA: Diagnosis not present

## 2024-06-13 DIAGNOSIS — K219 Gastro-esophageal reflux disease without esophagitis: Secondary | ICD-10-CM

## 2024-06-13 DIAGNOSIS — Z7409 Other reduced mobility: Secondary | ICD-10-CM | POA: Insufficient documentation

## 2024-06-13 DIAGNOSIS — I10 Essential (primary) hypertension: Secondary | ICD-10-CM | POA: Diagnosis not present

## 2024-06-13 DIAGNOSIS — G8929 Other chronic pain: Secondary | ICD-10-CM

## 2024-06-13 DIAGNOSIS — D649 Anemia, unspecified: Secondary | ICD-10-CM

## 2024-06-13 DIAGNOSIS — E7849 Other hyperlipidemia: Secondary | ICD-10-CM

## 2024-06-13 DIAGNOSIS — M25552 Pain in left hip: Secondary | ICD-10-CM

## 2024-06-13 DIAGNOSIS — R339 Retention of urine, unspecified: Secondary | ICD-10-CM | POA: Diagnosis not present

## 2024-06-13 DIAGNOSIS — D638 Anemia in other chronic diseases classified elsewhere: Secondary | ICD-10-CM | POA: Diagnosis not present

## 2024-06-13 DIAGNOSIS — T8459XD Infection and inflammatory reaction due to other internal joint prosthesis, subsequent encounter: Secondary | ICD-10-CM

## 2024-06-13 DIAGNOSIS — M0579 Rheumatoid arthritis with rheumatoid factor of multiple sites without organ or systems involvement: Secondary | ICD-10-CM

## 2024-06-13 DIAGNOSIS — R600 Localized edema: Secondary | ICD-10-CM | POA: Diagnosis not present

## 2024-06-13 LAB — CBC WITH DIFFERENTIAL/PLATELET
Basophils Absolute: 0 K/uL (ref 0.0–0.1)
Basophils Relative: 0.2 % (ref 0.0–3.0)
Eosinophils Absolute: 0 K/uL (ref 0.0–0.7)
Eosinophils Relative: 0.6 % (ref 0.0–5.0)
HCT: 31 % — ABNORMAL LOW (ref 36.0–46.0)
Hemoglobin: 10.5 g/dL — ABNORMAL LOW (ref 12.0–15.0)
Lymphocytes Relative: 12.9 % (ref 12.0–46.0)
Lymphs Abs: 0.8 K/uL (ref 0.7–4.0)
MCHC: 33.9 g/dL (ref 30.0–36.0)
MCV: 89.8 fl (ref 78.0–100.0)
Monocytes Absolute: 0.6 K/uL (ref 0.1–1.0)
Monocytes Relative: 8.6 % (ref 3.0–12.0)
Neutro Abs: 5 K/uL (ref 1.4–7.7)
Neutrophils Relative %: 77.7 % — ABNORMAL HIGH (ref 43.0–77.0)
Platelets: 298 K/uL (ref 150.0–400.0)
RBC: 3.45 Mil/uL — ABNORMAL LOW (ref 3.87–5.11)
RDW: 15.2 % (ref 11.5–15.5)
WBC: 6.4 K/uL (ref 4.0–10.5)

## 2024-06-13 MED ORDER — HYDROCODONE-ACETAMINOPHEN 5-325 MG PO TABS: 1.0000 | ORAL_TABLET | Freq: Three times a day (TID) | ORAL | 0 refills | Status: AC | PRN

## 2024-06-13 NOTE — Assessment & Plan Note (Signed)
 Having difficulty sleep Some of her sleep abilities are related to pain, wearing a leg brace and back brace, not being able to turn over in bed Continue mirtazapine  7.5 mg nightly-can increase increasing Will prescribe pain medication which may also help her sleep - if pain Is better controlled

## 2024-06-13 NOTE — Assessment & Plan Note (Signed)
 Subacute Not able to ambulate  An only stand and transfer with assistance Needs stair lift Manual wheelchair at home

## 2024-06-13 NOTE — Assessment & Plan Note (Signed)
 Chronic Continue valium  5 mg HS prn for muscle spasms

## 2024-06-13 NOTE — Assessment & Plan Note (Signed)
 Chronic  No longer on medication Will monitor

## 2024-06-13 NOTE — Patient Instructions (Addendum)
" ° °  Call urology - do you need flomax ?  [facility] - now [facility] [place] [facility] Address: [address] [place], [place] [address] Phone: [phone]   Contact dr fleeta dam with ID --- [email or web address]  restarted?      Blood work was ordered.       Medications changes include :   None      Return in about 6 months (around 12/11/2024) for follow up.  "

## 2024-06-13 NOTE — Assessment & Plan Note (Signed)
 Chronic S/p IV antibiotics Now on chronic Levaquin  therapy which she is tolerating well Following with ID

## 2024-06-13 NOTE — Assessment & Plan Note (Signed)
 Subacute Last year discharged with foley which urology removed Has not needed to f/u with urology Still taking flomax  which I do not think she needs - advised to call urology and confirm this so we can stop the medication

## 2024-06-13 NOTE — Assessment & Plan Note (Signed)
 Chronic S/p hip replacement Her left leg is 2 inches shorter since surgery Has pain when sitting and not currently able to ambulate

## 2024-06-13 NOTE — Transitions of Care (Post Inpatient/ED Visit) (Signed)
 "  06/13/2024  Name: Sherri [name]  IVONE LICHT MRN: [id number] DOB: 1940-07-17  Today's TOC FU Call Status: Today's TOC FU Call Status:: Successful TOC FU Call Completed Unsuccessful Call (1st Attempt) Date: 06/09/24 Unsuccessful Call (2nd Attempt) Date: 06/10/24 [facility] FU Call Complete Date: 06/13/24  Patient's Name and Date of Birth confirmed. DOB  Transition Care Management Follow-up Telephone Call Date of Discharge: 06/08/24 Discharge Facility: [facility]) Type of Discharge: Inpatient Admission Primary Inpatient Discharge Diagnosis:: NSTEMI How have you been since you were released from the hospital?: Better Any questions or concerns?: No  Items Reviewed: Did you receive and understand the discharge instructions provided?: Yes Medications obtained,verified, and reconciled?: Yes (Medications Reviewed) Any new allergies since your discharge?: No Dietary orders reviewed?: Yes Do you have support at home?: Yes People in Home [RPT]: spouse  Medications Reviewed Today: Medications Reviewed Today     Reviewed by Emmitt Pan, LPN (Licensed Practical Nurse) on 06/13/24 at 1221  Med List Status: <None>   Medication Order Taking? Sig Documenting Provider Last Dose Status Informant  acetaminophen  (TYLENOL ) 325 MG tablet [id number]  Take 2 tablets (650 mg total) by mouth every 6 (six) hours as needed for mild pain (pain score 1-3) or fever (or Fever >/= 101). Dorinda Drue DASEN, MD  Active Child, Pharmacy Records  ascorbic acid  (VITAMIN C ) 1000 MG tablet [id number]  Take 1 tablet (1,000 mg total) by mouth daily. Jerilynn Daphne SAILOR, NP  Active Child, Pharmacy Records  aspirin  EC 81 MG tablet [id number]  Take 1 tablet (81 mg total) by mouth daily. Swallow whole. Rizwan, Saima, MD  Active   Biotin  5000 MCG TABS [id number]  Take 5,000 mcg by mouth in the morning. [provider]  Active Child, Pharmacy Records           Med Note STEPHEN, [facility]   Fri Apr 10, 2017  2:56 PM)     Carboxymethylcellul-Glycerin  (LUBRICATING EYE DROPS OP) [id number]  Place 1 drop into both eyes 3 (three) times daily as needed (dry eyes). [provider]  Active Child, Pharmacy Records  chlorhexidine  (PERIDEX ) 0.12 % solution [id number] Yes Use as directed 15 mLs in the mouth or throat 4 (four) times daily.  Patient taking differently: Use as directed 15 mLs in the mouth or throat 4 (four) times daily as needed.   Will Almarie MATSU, MD  Active Child, Pharmacy Records  Cholecalciferol  (VITAMIN D3) 1000 units CAPS [id number]  Take 1,000 Units by mouth daily. [provider]  Active Child, Pharmacy Records  cyclobenzaprine  (FLEXERIL ) 10 MG tablet [id number] Yes TAKE 1 TABLET BY MOUTH THREE TIMES A DAY AS NEEDED FOR MUSCLE SPASM  Patient taking differently: Take 10 mg by mouth 3 (three) times daily as needed for muscle spasms.   [name] PARAS, MD  Active Child, Pharmacy Records  denosumab  (PROLIA ) 60 MG/ML SOSY injection [id number]  Inject 60 mg into the skin every 6 (six) months. Deliver to rheum: [address], [address], [place], [place] [address]. Appt on 07/23/2022 Dale, Taylor M, PA-C  Active Child, Pharmacy Records           Med Note EFRAIM ALFREIDA LITTIE Pablo Jun 06, 2024  6:31 PM) Per patients daughter her last injection was about 2 months ago.  diazepam  (VALIUM ) 5 MG tablet [id number] Yes Take 1 tablet (5 mg total) by mouth at bedtime as needed for anxiety. TAKE 1 TABLET BY MOUTH AT BEDTIME AS NEEDED FOR MUSCLE SPASMS.  Patient taking differently: Take 5  mg by mouth at bedtime. TAKE 1 TABLET BY MOUTH AT BEDTIME AS NEEDED FOR MUSCLE SPASMS.   [name] PARAS, MD  Active Child, Pharmacy Records  DULoxetine  (CYMBALTA ) 20 MG capsule [id number]  TAKE 1 CAPSULE BY MOUTH EVERYDAY AT BEDTIME Webb, Padonda B, FNP  Active Child, Pharmacy Records  folic acid  (FOLVITE ) 1 MG tablet [id number]  Take 1 tablet (1 mg total) by mouth daily. Cheryl Waddell HERO, PA-C  Active Child, Pharmacy Records   furosemide  (LASIX ) 20 MG tablet [id number]  TAKE 1/2 TABLET BY MOUTH DAILY Burns, Glade PARAS, MD  Active   HYDROcodone -acetaminophen  (NORCO/VICODIN) 5-325 MG tablet [id number]  Take 1 tablet by mouth every 8 (eight) hours as needed for severe pain (pain score 7-10) (Chronic joint pain).  Patient not taking: Reported on 06/13/2024   [name] PARAS, MD  Active Child, Pharmacy Records  Incontinence Supply Disposable (REALITY GERALINE SELWYN BLEAK) MISC [id number]  UAD for urinary incontinence [name] PARAS, MD  Active Child, Pharmacy Records  Lactobacillus (ACIDOPHILUS) CAPS capsule [id number]  Take 2 capsules by mouth 3 (three) times daily.  Patient not taking: Reported on 06/13/2024   Arlon Carliss ORN, DO  Active Child, Pharmacy Records  levofloxacin  (LEVAQUIN ) 750 MG tablet [id number]  Take 1 tablet (750 mg total) by mouth daily. Fleeta Rothman, Jomarie SAILOR, MD  Active Child, Pharmacy Records  lidocaine  (XYLOCAINE ) 2 % solution [id number]  Use as directed 15 mLs in the mouth or throat every 3 (three) hours as needed for mouth pain. Will Almarie MATSU, MD  Active Child, Pharmacy Records  magic mouthwash (nystatin , lidocaine , diphenhydrAMINE , alum & mag hydroxide) suspension [id number]  Swish and spit 5 mLs 3 (three) times daily as needed for mouth pain. [name] PARAS, MD  Active Child, Pharmacy Records  mirtazapine  (REMERON ) 7.5 MG tablet [id number]  TAKE 1 TABLET BY MOUTH AT BEDTIME. [name] PARAS, MD  Active Child, Pharmacy Records  Nystatin  (GERHARDT'S BUTT CREAM) CREA [id number] Yes Apply 1 Application topically 3 (three) times daily.  Patient taking differently: Apply 1 Application topically as needed for irritation (to buttocks after every cleansing/drying).   [name] PARAS, MD  Active Child, Pharmacy Records  ondansetron  (ZOFRAN ) 4 MG tablet [id number]  Take 1 tablet (4 mg total) by mouth every 6 (six) hours as needed for nausea. Will Almarie MATSU, MD  Active Child, Pharmacy Records  pantoprazole   (PROTONIX ) 40 MG tablet [id number]  TAKE 1 TABLET (40 MG TOTAL) BY MOUTH TWICE A DAY BEFORE MEALS Burns, Glade PARAS, MD  Active Child, Pharmacy Records  phenol [facility]) 1.4 % LIQD [id number]  Use as directed 1 spray in the mouth or throat as needed for throat irritation / pain. Will Almarie MATSU, MD  Active Child, Pharmacy Records  polyethylene glycol powder (GLYCOLAX /MIRALAX ) 17 GM/SCOOP powder [id number]  Take 17 g by mouth daily.  Patient taking differently: Take 17 g by mouth See admin instructions. Mix 17 grams into a cup of coffee and drink in the morning   Arlon Carliss ORN, DO  Active Child, Pharmacy Records  predniSONE  (DELTASONE ) 5 MG tablet [id number]  Take 1 tablet (5 mg total) by mouth daily with breakfast. Cheryl Waddell HERO, PA-C  Active Child, Pharmacy Records  tamsulosin  (FLOMAX ) 0.4 MG CAPS capsule [id number]  TAKE 1 CAPSULE BY MOUTH EVERY DAY AFTER SUPPER Webb, Padonda B, FNP  Active Child, Pharmacy Records  Zinc  Sulfate 220 (50 Zn) MG TABS [id number]  Take 1 tablet (220 mg total) by mouth daily with supper. Jerilynn,  Daphne SAILOR, NP  Active Child, Pharmacy Records  Med List Note Steffi Nian, CPhT 01/19/24 9073): Daughter is able to help with medications.             Home Care and Equipment/Supplies: Were Home Health Services Ordered?: Yes Name of Home Health Agency:: unknown Has Agency set up a time to come to your home?: Yes First Home Health Visit Date: 06/10/24 Any new equipment or medical supplies ordered?: NA  Functional Questionnaire: Do you need assistance with bathing/showering or dressing?: Yes Do you need assistance with meal preparation?: Yes Do you need assistance with eating?: No Do you have difficulty maintaining continence: No Do you need assistance with getting out of bed/getting out of a chair/moving?: No Do you have difficulty managing or taking your medications?: Yes  Follow up appointments reviewed: PCP Follow-up appointment confirmed?:  Yes Date of PCP follow-up appointment?: 06/13/24 Follow-up Provider: [facility] Follow-up appointment confirmed?: NA Do you need transportation to your follow-up appointment?: No Do you understand care options if your condition(s) worsen?: Yes-patient verbalized understanding    SIGNATURE Julian Lemmings, LPN [facility] Nurse Health Advisor Direct Dial [phone]  "

## 2024-06-13 NOTE — Assessment & Plan Note (Signed)
 Following with Dr. Dolphus [name]  on hold due to Pseudomonas infection Needs clearance from ID to restart [name]  - advised her to contact ID to see if they are ok with her restarting it

## 2024-06-13 NOTE — Assessment & Plan Note (Signed)
 Chronic Blood pressure currently controlled Currently only on furosemide  10 mg daily No other BP meds Monitor BP at home

## 2024-06-13 NOTE — Assessment & Plan Note (Signed)
 Chronic Acute worsening while in the hospital w/o symptoms c/w bleed Likely error Cbc today

## 2024-06-13 NOTE — Assessment & Plan Note (Signed)
 Chronic Controlled Wearing compression socks and elevating legs Continue furosemide  10 mg daily

## 2024-06-13 NOTE — Assessment & Plan Note (Signed)
 Chronic Controlled Continue pantoprazole  40 mg twice daily

## 2024-06-13 NOTE — Assessment & Plan Note (Signed)
 Recent hospitalization for this No obvious cause No prodromal symptoms Has holter monitor and will f/u with cardio

## 2024-06-14 ENCOUNTER — Telehealth: Payer: Self-pay

## 2024-06-14 ENCOUNTER — Ambulatory Visit: Payer: Self-pay | Admitting: Internal Medicine

## 2024-06-14 NOTE — Telephone Encounter (Signed)
 Pain medication was sent to pharmacy yesterday evening.  Left message for patient today to check with pharmacy for pick up.

## 2024-06-14 NOTE — Telephone Encounter (Signed)
 FYI Only or Action Required?: Action required by provider: Pt would like something prescribed for chronic hip and shoulder pain today.  Patient was last seen in primary care on 06/13/2024 by [name] PARAS, MD.  Called Nurse Triage reporting Hip Pain.  Symptoms began May 2025.  Interventions attempted: Prescription medications: Valium .  Symptoms are: gradually worsening.  Triage Disposition: Call PCP Now (overriding See PCP Within 2 Weeks)  Patient/caregiver understands and will follow disposition?: Yes   Chronic pain in both shoulders and hips r/t RA, bilateral hip surgeries and falls in the past year. Uses a WC.10/10 pain in both hips and both shoulders since May 2025 after injuries r/t fall. No fever. No changes to bowel or bladder control. No numbness. No obvious injury.  Has not taken her rinvoq  since May 2025 per rheumatologist d/t needing hip surgery. Pt contacted their office and LVM asking if ok to restart again. Slows down arthritis but doesn't help with pain. Pt wanting to know something can be prescribed for pain. Flexeril  was not helpful when she was taking it. Not currently taking this.  Seen by PCP yesterday. Forwarding request to office, pt wanting to know if this can be prescribed today. Preferred pharmacy [facility] on [address].  Pt would like to be called to be notified if something can be prescribed. Advised UC or ED for worsening symptoms in the meantime.     Message from [place] C sent at 06/14/2024  1:19 PM EST  Reason for Triage: Patient states she is in chronic pain on both shoulders, and both hips, and wanting to see if can just get something for the pain.   Reason for Disposition  Hip pain is a chronic symptom (recurrent or ongoing AND present > 4 weeks)  Answer Assessment - Initial Assessment Questions 1. LOCATION and RADIATION: Where is the pain located? Does the pain spread (shoot) anywhere else?     Both hips  2. QUALITY: What does the pain feel  like?  (e.g., sharp, dull, aching, burning)     Down left leg  3. SEVERITY: How bad is the pain? What does it keep you from doing?   (Scale 1-10; or mild, moderate, severe)     10/10  4. ONSET: When did the pain start? Does it come and go, or is it there all the time?     May 2025  5. WORK OR EXERCISE: Has there been any recent work or exercise that involved this part of the body?      Has had bilateral hip surgeries  6. CAUSE: What do you think is causing the hip pain?      Arthritis  7. AGGRAVATING FACTORS: What makes the hip pain worse? (e.g., walking, climbing stairs, running)     Movement  8. OTHER SYMPTOMS: Do you have any other symptoms? (e.g., back pain, pain shooting down leg,  fever, rash)     Poor sleep d/t pain  Protocols used: Hip Pain-A-AH

## 2024-06-14 NOTE — Telephone Encounter (Signed)
 Received voicemail from patient requesting that Dr. Fleeta Rothman reach out to Dr. Dolphus to let her know that it's okay to resume her [name] .   Duwaine Lowe, BSN, RN

## 2024-06-15 NOTE — Telephone Encounter (Signed)
 Called patient, she has appointment 2/9 and is comfortable discussing then. Appointment note updated.   Zynia Wojtowicz, BSN, RN

## 2024-06-16 ENCOUNTER — Ambulatory Visit: Payer: Self-pay | Admitting: Internal Medicine

## 2024-06-30 ENCOUNTER — Ambulatory Visit: Payer: Self-pay

## 2024-06-30 NOTE — Telephone Encounter (Signed)
 FYI Only or Action Required?: FYI only for provider: appointment scheduled on 07/14/24; added to wait list per pt request.  Patient was last seen in primary care on 06/13/2024 by [name] PARAS, MD.  Called Nurse Triage reporting Alopecia.  Symptoms began several weeks ago.  Interventions attempted: OTC medications: zinc , vit C, biotin .  Symptoms are: gradually worsening.  Triage Disposition: See PCP Within 2 Weeks  Patient/caregiver understands and will follow disposition?: Yes    Message from [facility] sent at 06/30/2024  1:21 PM EST  Summary: Severe hair loss   Reason for Triage: Patient states that she is having extreme hair loss recently. She was seen by PCP last month and has experienced significant hair loss since her last appt. Wanted to see Dr. Geofm for this. Triage chat instructed to send to [facility]            Reason for Disposition  [1] Hair thinning, hair loss, or balding AND [2] cause not known (e.g., no recent precipitating factors such as childbirth, weight loss surgery)  Answer Assessment - Initial Assessment Questions Pt called to request appt with PCP asap for hair thinning/loss on scalp. Pt reports entire scalp involved, no appearance change in scalp, no flaking, no rash. Pt has endured several stressers including hospitalization resulting in significant weight loss and major surgeries. Pt takes zinc , vit c and biotin  supplements already, no new medications. Reassured pt I would get her added to wait list. Pt is available via cell in event appt opens up next week and okay to LM if she does not answer. Appointment scheduled for evaluation. Patient agrees with plan of care, and will call back if anything changes, or if symptoms worsen.     1. LOCATION: Where is the hair loss? (e.g., all of scalp, parts of scalp, back of head or neck)     Full scalp   2. DESCRIPTION: What does it look like? (e.g., thinning of hair, balding, patches of hair missing)     Thinning    3. ONSET: When did the hair loss begin? (e.g., sudden or gradual onset; days, weeks, months or years ago)     Over the last month  4. OTHER SYMPTOMS: What does the scalp look like where the hair is missing? (e.g., normal, redness, crusts, scarring)     No appearance change; no flaking, no rash  5. OTHER FACTORS: Have you had any of the following recently: childbirth, severe illness or injury, major surgery, major weight loss, cancer chemo, tight hair braids, serious stress?     Recent major surgeries, significant weight loss with hospitalization   6. CAUSE: What do you think is causing the hair loss?     Unsure, suspects stress  Protocols used: Hair Loss-A-AH

## 2024-06-30 NOTE — Telephone Encounter (Signed)
 Noted.

## 2024-07-01 NOTE — Progress Notes (Unsigned)
 "  Office Visit Note  Patient: Sherri  CARMELINA [name]             Date of Birth: February 19, 1941           MRN: [id number]             PCP: [name] PARAS, MD Referring: [name] PARAS, MD Visit Date: 07/06/2024 Occupation: Data Unavailable  Subjective:  No chief complaint on file.   History of Present Illness: Sherri [name] is a 84 y.o. female ***     Activities of Daily Living:  Patient reports morning stiffness for *** {minute/hour:19697}.   Patient {ACTIONS;DENIES/REPORTS:21021675::Denies} nocturnal pain.  Difficulty dressing/grooming: {ACTIONS;DENIES/REPORTS:21021675::Denies} Difficulty climbing stairs: {ACTIONS;DENIES/REPORTS:21021675::Denies} Difficulty getting out of chair: {ACTIONS;DENIES/REPORTS:21021675::Denies} Difficulty using hands for taps, buttons, cutlery, and/or writing: {ACTIONS;DENIES/REPORTS:21021675::Denies}  No Rheumatology ROS completed.   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   Bilateral hearing loss 04/08/2024   Sleep difficulties 04/08/2024   Acute anemia 04/05/2024   Rash 03/10/2024   Drug reaction 03/10/2024   Peeling skin 03/10/2024   Prosthetic hip infection 01/25/2024   Pseudomonas aeruginosa infection 01/25/2024   Hip dislocation, right (HCC) 01/19/2024   Chronic pain 01/19/2024   Anemia of chronic disease 12/31/2023   Multiple skin tears 12/31/2023   Urinary retention 12/27/2023   Debility 12/08/2023   History of revision of total replacement of right hip joint 12/08/2023   Postoperative wound infection 12/08/2023   Closed fracture of right elbow 12/08/2023   Macrocytic anemia 11/27/2023   Lightheadedness 11/10/2023   Physical deconditioning 11/10/2023   Parotid swelling 11/10/2023    Hip fracture (HCC) 11/10/2023   Hyponatremia 10/02/2023   Cervical spondylosis with myelopathy and radiculopathy 04/23/2022   Constipation 03/04/2022   Upper back pain on left side 03/04/2022   Lumbar vertebral fracture, pathologic 09/20/2021   Degenerative spondylolisthesis 08/19/2021   Senile purpura 12/19/2020   Restless leg 02/02/2020   Bilateral leg edema 01/11/2020   Cervical radiculopathy at C8 06/28/2019   Carpal tunnel syndrome on both sides 06/28/2019   Right sided sciatica 04/14/2018   Trochanteric bursitis of right hip 04/14/2018   H/O cervical spine surgery 11/10/2017   Dysphagia 10/26/2017   GERD (gastroesophageal reflux disease) 08/06/2017   Easy bruising 08/06/2017   Mid back pain 03/26/2017   High risk medication use 09/02/2016   Piriformis muscle pain 09/02/2016   Right hip pain 04/08/2016   Right knee pain 02/25/2016   Numbness and tingling in left hand 11/14/2014   Biceps tendon tear 10/10/2014   OP (osteoporosis) 01/18/2014   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 01/01/2013   History of colonic polyps 07/03/2010   Nocturia 05/28/2010   THYROID  NODULE 05/17/2008   Multiple sclerosis 05/17/2008   Other fatigue 03/30/2007   Hyperlipidemia 08/20/2006   Essential hypertension 08/20/2006   Rheumatoid arthritis (HCC) 08/20/2006   Osteoarthritis 08/20/2006   CERVICAL CANCER, HX OF 08/20/2006   GASTRIC ULCER, HX OF 08/20/2006    Past Medical History:  Diagnosis Date   Anxiety    takes Valium  daily as needed   Basal cell carcinoma 01/21/1988   left nostril (MOHS), sup-right calf (CX35FU)   Basal cell carcinoma 03/22/1996   right calf (CX35FU)   Basal cell carcinoma 03/31/1995   left  wing nose   Bruises easily    d/t meds   Cancer (HCC)    basal cell ca, in situ- uterine    Cataracts, bilateral    removed bilateral   Chronic back pain    Dizziness    r/t to meds   GERD (gastroesophageal reflux disease)    no meds on a regular basis but will  take Tums if needed   Headache(784.0)    r/t neck issues   History of bronchitis 6-64yrs ago   History of colon polyps    benign   HOH (hard of hearing)    wears hearing aids   HOH (hard of hearing)    wears hearing aids    Hyperlipidemia    takes Atorvastatin  on Mondays and Fridays   Hypertension    Joint pain    Joint swelling    Multiple sclerosis    Neuromuscular disorder (HCC)    Dr. FABIENE Crete- [facility], follows [facility]   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   Rheumatoid arthritis (HCC)    Dr Lorrayne weekly, RA- hands- knees- feet    Rheumatoid arthritis(714.0)    Dr Dolphus takes Xeljanz  daily   Right wrist fracture    Skin cancer    Squamous cell carcinoma of skin 11/02/2001   in situ-right knee (cx61fu)   Squamous cell carcinoma of skin 11/12/2011   in situ-left forearm (CX35FU), in situ-left foot (CX35FU)   Squamous cell carcinoma of skin 01/22/2012   in situ-left lower forearm (txpbx)   Squamous cell carcinoma of skin 11/17/2012   right shin (txpbx)   Squamous cell carcinoma of skin 10/28/2013   in situ-right shoulder (CX35FU)   Squamous cell carcinoma of skin 04/28/2014   well diff-right shin (txpbx)   Squamous cell carcinoma of skin 11/02/2014   in situ-Left shin (txpbx)   Squamous cell carcinoma of skin 12/15/2014   in situ-Left hand (txpbx), bowens-left side chest (txpbx)   Squamous cell carcinoma of skin 05/09/2015   in situ-left hand (txpbx)    Squamous cell carcinoma of skin 05/07/2016   in situ-left inner shin,ant, in situ-left inner shin, post, in situ-left outer forearm, in situ-right knuckle   Squamous cell carcinoma of skin 03/1302018   in situ-left shoulder (txpbx), in situ-right inner shin (txpbx), in situ-top of left foot (txpbx), KA- left forearm (txpbx)   Squamous cell  carcinoma of skin 12/02/2016   in situ-left inner shin (txpbx)   Squamous cell carcinoma of skin 03/04/2017   in situ-left outer sup, shin (txpbx), in situ- right 2nd knuckle finger (txpbx), in situ- Left wrist (txpbx)   Squamous cell carcinoma of skin 04/06/2018   in situ-above left knee inner (txpbx)   Squamous cell carcinoma of skin 04/21/2018   in situ-right top hand (txpbx)   Squamous cell carcinoma of skin 07/08/2018   in situ-right lower inner shin (txpbx)   Squamous cell carcinoma of skin 04/27/2019   in situ- Left neck(CX35FU), In situ- right neck (CX35FU)   Urinary retention    sees Dr.Wrenn about 2 times a yr    Family History  Problem Relation Age of Onset   Cancer Mother        pancreatic   Diabetes Mother    Pancreatic cancer Mother    Heart disease Father  Rheumatic   Cancer Father        ? stomach   Stomach cancer Father    Cancer Sister        stomach   Stomach cancer Sister    Cancer Maternal Aunt        X 4; ? primary   Heart disease Paternal Aunt    Cancer Maternal Grandmother        cervical   Colon cancer Other        Aunts   Multiple sclerosis Daughter    Hypertension Neg Hx    Stroke Neg Hx    Esophageal cancer Neg Hx    Rectal cancer Neg Hx    Past Surgical History:  Procedure Laterality Date   ABDOMINAL HYSTERECTOMY     1985   ANTERIOR CERVICAL DECOMP/DISCECTOMY FUSION N/A 04/23/2022   Procedure: Anterior Cervical Decompression/Discectomy Fusion - Cervical Seven-Thoracic One;  Surgeon: Louis Shove, MD;  Location: [facility];  Service: Neurosurgery;  Laterality: N/A;  3C   ANTERIOR HIP REVISION Right 01/21/2024   Procedure: REVISION, TOTAL ARTHROPLASTY, HIP, ANTERIOR APPROACH;  Surgeon: Fidel Rogue, MD;  Location: [facility];  Service: Orthopedics;  Laterality: Right;   APPENDECTOMY     with TAH   BACK SURGERY     several   BREAST BIOPSY Right    Results were negative   BROW LIFT Bilateral 10/02/2016   Procedure: BILATERAL LOWER LID  BLEPHAROPLASTY;  Surgeon: Laurie Loyd Redhead, MD;  Location: [facility];  Service: Plastics;  Laterality: Bilateral;   CARPAL TUNNEL RELEASE Right    cataracts     CERVICAL FUSION     Dr Leeann   CERVICAL FUSION  12/31/2012   Dr Leeann   CERVICAL FUSION  04/2022   CHEST TUBE INSERTION     for traumatic Pneumothorax   COLONOSCOPY  07/16/2010   normal    COLONOSCOPY W/ POLYPECTOMY  1997   negative since; Dr Debrah   ESOPHAGOGASTRODUODENOSCOPY  07/16/2010   normal   eye lid raise     EYE SURGERY Bilateral    cataracts removed - /w IOL   HARDWARE REMOVAL  08/2021   HIP CLOSED REDUCTION Right 11/16/2023   Procedure: CLOSED MANIPULATION, JOINT, HIP;  Surgeon: Ernie Cough, MD;  Location: [facility];  Service: Orthopedics;  Laterality: Right;   HIP CLOSED REDUCTION N/A 11/30/2023   Procedure: CLOSED REDUCTION, HIP;  Surgeon: Fidel Rogue, MD;  Location: [facility];  Service: Orthopedics;  Laterality: N/A;   INCISION AND DRAINAGE OF WOUND Right 11/28/2023   Procedure: IRRIGATION AND DEBRIDEMENT WOUND; REMOVAL OF HARDWARE;  Surgeon: Edna Toribio LABOR, MD;  Location: [facility];  Service: Orthopedics;  Laterality: Right;  I & D RIGHT ELBOW   JOINT REPLACEMENT     LUMBAR FUSION     Dr Leeann   NASAL SINUS SURGERY     ORIF ELBOW FRACTURE Right 11/20/2023   Procedure: OPEN REDUCTION INTERNAL FIXATION (ORIF) ELBOW/OLECRANON FRACTURE;  Surgeon: [name] Franky SQUIBB, MD;  Location: [facility];  Service: Orthopedics;  Laterality: Right;   POSTERIOR CERVICAL FUSION/FORAMINOTOMY N/A 12/31/2012   Procedure: POSTERIOR LATERAL CERVICAL FUSION/FORAMINOTOMY LEVEL 1 CERVICAL THREE-FOUR WITH LATERAL MASS SCREWS;  Surgeon: Catalina CHRISTELLA Leeann, MD;  Location: [facility];  Service: Neurosurgery;  Laterality: N/A;   PTOSIS REPAIR Bilateral 10/02/2016   Procedure: INTERNAL PTOSIS REPAIR;  Surgeon: Laurie Loyd Redhead, MD;  Location: [facility];  Service: Plastics;  Laterality: Bilateral;   SKIN BIOPSY     THYROID  SURGERY  R lobe removed-  1972, has grown back - CT last done- 2018   TOTAL HIP ARTHROPLASTY  12/22/2011   Procedure: TOTAL HIP ARTHROPLASTY;  Surgeon: Dempsey JINNY Sensor, MD;  Location: [facility];  Service: Orthopedics;  Laterality: Left;   TOTAL HIP ARTHROPLASTY Right 11/12/2023   Procedure: ARTHROPLASTY, HIP, TOTAL, ANTERIOR APPROACH;  Surgeon: Fidel Rogue, MD;  Location: [facility];  Service: Orthopedics;  Laterality: Right;   TOTAL HIP REVISION Right 12/03/2023   Procedure: TOTAL HIP REVISION;  Surgeon: Fidel Rogue, MD;  Location: [facility];  Service: Orthopedics;  Laterality: Right;  Right acetabular revision Anterior approach   TOTAL SHOULDER ARTHROPLASTY     TUBAL LIGATION     UPPER GASTROINTESTINAL ENDOSCOPY  2012   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   WRIST SURGERY Left 09/16/2022   wrist/arm   Social History[1] Social History   Social History Narrative   Married     Immunization History  Administered Date(s) Administered   Fluad Quad(high Dose 65+) 02/02/2020, 02/01/2022   INFLUENZA, HIGH DOSE SEASONAL PF 02/04/2015, 02/18/2017, 02/21/2018, 01/16/2019, 01/25/2021, 02/02/2023   Influenza Whole 02/22/2007, 02/08/2013   Influenza-Unspecified 01/24/2014, 01/25/2016, 02/18/2017, 01/16/2019, 01/25/2020   Moderna Covid-19 Fall Seasonal Vaccine 7yrs & older 02/02/2023, 09/07/2023   Moderna Covid-19 Vaccine Bivalent Booster 10yrs & up 02/11/2022   PFIZER(Purple Top)SARS-COV-2 Vaccination 06/18/2019, 07/09/2019, 01/09/2020, 07/03/2020, 02/07/2021   PNEUMOCOCCAL CONJUGATE-20 02/26/2022   Pfizer Covid-19 Vaccine Bivalent Booster 16yrs & up 02/08/2021   Pneumococcal Conjugate-13 11/23/2012, 09/08/2014   Pneumococcal Polysaccharide-23 05/26/2006   Pneumococcal-Unspecified 12/28/2012, 06/09/2020   Respiratory Syncytial Virus Vaccine,Recomb Aduvanted(Arexvy) 01/23/2022   Td 05/28/2010   Tdap 12/24/2020, 01/07/2021   Unspecified SARS-COV-2 Vaccination 06/26/2020, 02/11/2022   Varicella 11/07/2020   Zoster  Recombinant(Shingrix) 02/10/2019, 04/14/2019   Zoster, Live 05/26/2009, 07/10/2020     Objective: Vital Signs: There were no vitals taken for this visit.   Physical Exam   Musculoskeletal Exam: ***  CDAI Exam: CDAI Score: -- Patient Global: --; Provider Global: -- Swollen: --; Tender: -- Joint Exam 07/06/2024   No joint exam has been documented for this visit   There is currently no information documented on the homunculus. Go to the Rheumatology activity and complete the homunculus joint exam.  Investigation: No additional findings.  Imaging: ECHOCARDIOGRAM COMPLETE Result Date: 06/07/2024    ECHOCARDIOGRAM REPORT   Patient Name:   Dayonna  A Vought Date of Exam: 06/07/2024 Medical Rec #:  [id number]         Height:       60.0 in Accession #:    [id number]        Weight:       105.8 lb Date of Birth:  08-06-1940         BSA:          1.424 m Patient Age:    83 years          BP:           134/62 mmHg Patient Gender: F                 HR:           69 bpm. Exam Location:  Inpatient Procedure: 2D Echo (Both Spectral and Color Flow Doppler were utilized during            procedure). Indications:    Syncope  History:        Patient has no prior history of Echocardiogram examinations.  Previous Myocardial Infarction, Arrythmias:LBBB;                 Signs/Symptoms:Syncope.  Sonographer:    Merlynn Argyle Referring Phys: [id number] SUMAYYA AMIN IMPRESSIONS  1. Left ventricular ejection fraction, by estimation, is 60 to 65%. The left ventricle has normal function. The left ventricle has no regional wall motion abnormalities. Left ventricular diastolic parameters are consistent with Grade I diastolic dysfunction (impaired relaxation).  2. Right ventricular systolic function is normal. The right ventricular size is normal. There is normal pulmonary artery systolic pressure. The estimated right ventricular systolic pressure is 30.0 mmHg.  3. The mitral valve is degenerative. Mild mitral valve  regurgitation. No evidence of mitral stenosis. Moderate mitral annular calcification.  4. The aortic valve is tricuspid. Aortic valve regurgitation is not visualized. Aortic valve sclerosis is present, with no evidence of aortic valve stenosis.  5. The inferior vena cava is normal in size with greater than 50% respiratory variability, suggesting right atrial pressure of 3 mmHg. FINDINGS  Left Ventricle: Left ventricular ejection fraction, by estimation, is 60 to 65%. The left ventricle has normal function. The left ventricle has no regional wall motion abnormalities. The left ventricular internal cavity size was normal in size. There is  no left ventricular hypertrophy. Left ventricular diastolic parameters are consistent with Grade I diastolic dysfunction (impaired relaxation). Right Ventricle: The right ventricular size is normal. No increase in right ventricular wall thickness. Right ventricular systolic function is normal. There is normal pulmonary artery systolic pressure. The tricuspid regurgitant velocity is 2.60 m/s, and  with an assumed right atrial pressure of 3 mmHg, the estimated right ventricular systolic pressure is 30.0 mmHg. Left Atrium: Left atrial size was normal in size. Right Atrium: Right atrial size was normal in size. Pericardium: There is no evidence of pericardial effusion. Presence of epicardial fat layer. Mitral Valve: The mitral valve is degenerative in appearance. Moderate mitral annular calcification. Mild mitral valve regurgitation. No evidence of mitral valve stenosis. Tricuspid Valve: The tricuspid valve is grossly normal. Tricuspid valve regurgitation is mild . No evidence of tricuspid stenosis. Aortic Valve: The aortic valve is tricuspid. Aortic valve regurgitation is not visualized. Aortic valve sclerosis is present, with no evidence of aortic valve stenosis. Pulmonic Valve: The pulmonic valve was grossly normal. Pulmonic valve regurgitation is not visualized. No evidence of  pulmonic stenosis. Aorta: The aortic root and ascending aorta are structurally normal, with no evidence of dilitation. Venous: The inferior vena cava is normal in size with greater than 50% respiratory variability, suggesting right atrial pressure of 3 mmHg. IAS/Shunts: The atrial septum is grossly normal.  LEFT VENTRICLE PLAX 2D LVIDd:         3.10 cm   Diastology LVIDs:         2.20 cm   LV e' medial:    5.66 cm/s LV PW:         1.00 cm   LV E/e' medial:  16.4 LV IVS:        1.00 cm   LV e' lateral:   6.42 cm/s LVOT diam:     1.90 cm   LV E/e' lateral: 14.5 LV SV:         61 LV SV Index:   43 LVOT Area:     2.84 cm  RIGHT VENTRICLE             IVC RV Basal diam:  2.80 cm     IVC diam: 1.70 cm RV S prime:  12.50 cm/s TAPSE (M-mode): 1.9 cm LEFT ATRIUM             Index        RIGHT ATRIUM           Index LA diam:        3.70 cm 2.60 cm/m   RA Area:     11.10 cm LA Vol (A2C):   27.1 ml 19.03 ml/m  RA Volume:   23.00 ml  16.15 ml/m LA Vol (A4C):   33.0 ml 23.17 ml/m LA Biplane Vol: 32.5 ml 22.82 ml/m  AORTIC VALVE LVOT Vmax:   95.10 cm/s LVOT Vmean:  64.900 cm/s LVOT VTI:    0.214 m  AORTA Ao Root diam: 3.10 cm Ao Asc diam:  3.40 cm MITRAL VALVE                TRICUSPID VALVE MV Area (PHT): 2.91 cm     TR Peak grad:   27.0 mmHg MV Decel Time: 261 msec     TR Vmax:        260.00 cm/s MR Peak grad: 104.0 mmHg MR Mean grad: 66.0 mmHg     SHUNTS MR Vmax:      510.00 cm/s   Systemic VTI:  0.21 m MR Vmean:     385.0 cm/s    Systemic Diam: 1.90 cm MV E velocity: 93.00 cm/s MV A velocity: 119.00 cm/s MV E/A ratio:  0.78 Darryle Decent MD Electronically signed by Darryle Decent MD Signature Date/Time: 06/07/2024/3:14:07 PM    Final    DG Chest Portable 1 View Result Date: 06/06/2024 CLINICAL DATA:  Syncope, myocardial infarction EXAM: PORTABLE CHEST 1 VIEW COMPARISON:  03/25/2024 FINDINGS: Single frontal view of the chest demonstrates an unremarkable cardiac silhouette. No acute airspace disease, effusion, or  pneumothorax. Evidence of healing right anterior second rib fracture with moderate callus formation, new since prior study. No acute bony abnormalities. IMPRESSION: 1. No acute intrathoracic process. 2. Healing right anterior second rib fracture, new since prior exam. Electronically Signed   By: Ozell Daring M.D.   On: 06/06/2024 18:27    Recent Labs: Lab Results  Component Value Date   WBC 6.4 06/13/2024   HGB 10.5 (L) 06/13/2024   PLT 298.0 06/13/2024   NA 140 06/07/2024   K 3.6 06/07/2024   CL 102 06/07/2024   CO2 29 06/07/2024   GLUCOSE 81 06/07/2024   BUN 19 06/07/2024   CREATININE 0.79 06/07/2024   BILITOT 0.2 06/06/2024   ALKPHOS 49 06/06/2024   AST 21 06/06/2024   ALT 5 06/06/2024   PROT 6.0 (L) 06/06/2024   ALBUMIN  3.2 (L) 06/06/2024   CALCIUM  9.2 06/07/2024   GFRAA 65 09/26/2020   QFTBGOLDPLUS NEGATIVE 11/03/2022    Speciality Comments: Prior therapy includes: Xeljanz  (cost), Olumiant -inadequate response, Imuran weakness, leflunomide-intolerance Prolia : 05/04/18, 11/12/18, 06/09/19, 12/07/19, 07/05/20, 01/01/21, 07/09/21, 01/09/22, 07/24/22  Procedures:  No procedures performed Allergies: Ciprofloxacin , Darvon [propoxyphene hcl], Demerol [meperidine], Penicillins, Imuran [azathioprine], Sulfa  antibiotics, Arava [leflunomide], Lipitor [atorvastatin ], and Vytorin [ezetimibe-simvastatin]   Assessment / Plan:     Visit Diagnoses: No diagnosis found.  Orders: No orders of the defined types were placed in this encounter.  No orders of the defined types were placed in this encounter.   Face-to-face time spent with patient was *** minutes. Greater than 50% of time was spent in counseling and coordination of care.  Follow-Up Instructions: No follow-ups on file.   Obi Scrima P Zhane Donlan, RT  Note - This  record has been created using [employer].  Chart creation errors have been sought, but may not always  have been located. Such creation errors do not reflect on  the  standard of medical care.    [1]  Social History Tobacco Use   Smoking status: Former    Current packs/day: 0.00    Average packs/day: 2.5 packs/day for 20.0 years (50.0 ttl pk-yrs)    Types: Cigarettes    Start date: 05/26/1961    Quit date: 05/26/1981    Years since quitting: 43.1    Passive exposure: Never   Smokeless tobacco: Never   Tobacco comments:    Smoked [id number], up to 2.5 ppd  Vaping Use   Vaping status: Never Used  Substance Use Topics   Alcohol  use: Yes    Alcohol /week: 14.0 standard drinks of alcohol     Types: 14 Cans of beer per week    Comment: 2% beer daily   Drug use: Never   "

## 2024-07-04 ENCOUNTER — Ambulatory Visit: Payer: Self-pay | Admitting: Infectious Disease

## 2024-07-06 ENCOUNTER — Ambulatory Visit: Admitting: Physician Assistant

## 2024-07-06 DIAGNOSIS — F5101 Primary insomnia: Secondary | ICD-10-CM

## 2024-07-06 DIAGNOSIS — M67431 Ganglion, right wrist: Secondary | ICD-10-CM

## 2024-07-06 DIAGNOSIS — S62102D Fracture of unspecified carpal bone, left wrist, subsequent encounter for fracture with routine healing: Secondary | ICD-10-CM

## 2024-07-06 DIAGNOSIS — G8929 Other chronic pain: Secondary | ICD-10-CM

## 2024-07-06 DIAGNOSIS — M0579 Rheumatoid arthritis with rheumatoid factor of multiple sites without organ or systems involvement: Secondary | ICD-10-CM

## 2024-07-06 DIAGNOSIS — R5383 Other fatigue: Secondary | ICD-10-CM

## 2024-07-06 DIAGNOSIS — G2581 Restless legs syndrome: Secondary | ICD-10-CM

## 2024-07-06 DIAGNOSIS — K219 Gastro-esophageal reflux disease without esophagitis: Secondary | ICD-10-CM

## 2024-07-06 DIAGNOSIS — Z96642 Presence of left artificial hip joint: Secondary | ICD-10-CM

## 2024-07-06 DIAGNOSIS — Z8659 Personal history of other mental and behavioral disorders: Secondary | ICD-10-CM

## 2024-07-06 DIAGNOSIS — G35D Multiple sclerosis, unspecified: Secondary | ICD-10-CM

## 2024-07-06 DIAGNOSIS — E559 Vitamin D deficiency, unspecified: Secondary | ICD-10-CM

## 2024-07-06 DIAGNOSIS — Z872 Personal history of diseases of the skin and subcutaneous tissue: Secondary | ICD-10-CM

## 2024-07-06 DIAGNOSIS — M810 Age-related osteoporosis without current pathological fracture: Secondary | ICD-10-CM

## 2024-07-06 DIAGNOSIS — M4312 Spondylolisthesis, cervical region: Secondary | ICD-10-CM

## 2024-07-06 DIAGNOSIS — Z79899 Other long term (current) drug therapy: Secondary | ICD-10-CM

## 2024-07-06 DIAGNOSIS — M79641 Pain in right hand: Secondary | ICD-10-CM

## 2024-07-06 DIAGNOSIS — M51369 Other intervertebral disc degeneration, lumbar region without mention of lumbar back pain or lower extremity pain: Secondary | ICD-10-CM

## 2024-07-06 DIAGNOSIS — S62101D Fracture of unspecified carpal bone, right wrist, subsequent encounter for fracture with routine healing: Secondary | ICD-10-CM

## 2024-07-06 DIAGNOSIS — Z7952 Long term (current) use of systemic steroids: Secondary | ICD-10-CM

## 2024-07-06 DIAGNOSIS — Z96641 Presence of right artificial hip joint: Secondary | ICD-10-CM

## 2024-07-06 DIAGNOSIS — Z8679 Personal history of other diseases of the circulatory system: Secondary | ICD-10-CM

## 2024-07-06 DIAGNOSIS — Z8639 Personal history of other endocrine, nutritional and metabolic disease: Secondary | ICD-10-CM

## 2024-07-14 ENCOUNTER — Ambulatory Visit: Admitting: Internal Medicine

## 2024-07-25 ENCOUNTER — Ambulatory Visit: Admitting: Physician Assistant

## 2024-08-08 ENCOUNTER — Ambulatory Visit: Admitting: Physician Assistant

## 2024-09-15 ENCOUNTER — Ambulatory Visit

## 2024-12-12 ENCOUNTER — Ambulatory Visit: Admitting: Internal Medicine

## 2025-05-24 ENCOUNTER — Encounter: Admitting: Internal Medicine

## 2025-05-24 ENCOUNTER — Ambulatory Visit
